# Patient Record
Sex: Female | Born: 1940 | Race: White | Hispanic: No | Smoking: Former smoker
Health system: Southern US, Community
[De-identification: ages and names within clinical notes are randomized; demographics above are authoritative.]

## PROBLEM LIST (undated history)

## (undated) DIAGNOSIS — C649 Malignant neoplasm of unspecified kidney, except renal pelvis: Secondary | ICD-10-CM

## (undated) DIAGNOSIS — E119 Type 2 diabetes mellitus without complications: Secondary | ICD-10-CM

## (undated) DIAGNOSIS — I10 Essential (primary) hypertension: Secondary | ICD-10-CM

## (undated) DIAGNOSIS — E785 Hyperlipidemia, unspecified: Secondary | ICD-10-CM

## (undated) DIAGNOSIS — E079 Disorder of thyroid, unspecified: Secondary | ICD-10-CM

## (undated) DIAGNOSIS — K8020 Calculus of gallbladder without cholecystitis without obstruction: Secondary | ICD-10-CM

## (undated) DIAGNOSIS — K831 Obstruction of bile duct: Secondary | ICD-10-CM

## (undated) DIAGNOSIS — R112 Nausea with vomiting, unspecified: Secondary | ICD-10-CM

## (undated) DIAGNOSIS — Z973 Presence of spectacles and contact lenses: Secondary | ICD-10-CM

## (undated) DIAGNOSIS — I739 Peripheral vascular disease, unspecified: Secondary | ICD-10-CM

## (undated) DIAGNOSIS — I219 Acute myocardial infarction, unspecified: Secondary | ICD-10-CM

## (undated) DIAGNOSIS — E039 Hypothyroidism, unspecified: Secondary | ICD-10-CM

## (undated) DIAGNOSIS — D649 Anemia, unspecified: Secondary | ICD-10-CM

## (undated) DIAGNOSIS — Z8719 Personal history of other diseases of the digestive system: Secondary | ICD-10-CM

## (undated) DIAGNOSIS — H269 Unspecified cataract: Secondary | ICD-10-CM

## (undated) DIAGNOSIS — R748 Abnormal levels of other serum enzymes: Secondary | ICD-10-CM

## (undated) DIAGNOSIS — K219 Gastro-esophageal reflux disease without esophagitis: Secondary | ICD-10-CM

## (undated) DIAGNOSIS — C801 Malignant (primary) neoplasm, unspecified: Secondary | ICD-10-CM

## (undated) DIAGNOSIS — M1990 Unspecified osteoarthritis, unspecified site: Secondary | ICD-10-CM

## (undated) DIAGNOSIS — Z8489 Family history of other specified conditions: Secondary | ICD-10-CM

## (undated) HISTORY — DX: Peripheral vascular disease, unspecified: I73.9

## (undated) HISTORY — PX: COLONOSCOPY: SHX174

## (undated) HISTORY — PX: UPPER GI ENDOSCOPY: SHX6162

---

## 1989-04-22 DIAGNOSIS — I219 Acute myocardial infarction, unspecified: Secondary | ICD-10-CM

## 1989-04-22 HISTORY — DX: Acute myocardial infarction, unspecified: I21.9

## 2015-05-23 DIAGNOSIS — I10 Essential (primary) hypertension: Secondary | ICD-10-CM | POA: Insufficient documentation

## 2015-05-23 DIAGNOSIS — E039 Hypothyroidism, unspecified: Secondary | ICD-10-CM | POA: Insufficient documentation

## 2015-05-23 DIAGNOSIS — E785 Hyperlipidemia, unspecified: Secondary | ICD-10-CM | POA: Insufficient documentation

## 2016-04-22 DIAGNOSIS — Z9289 Personal history of other medical treatment: Secondary | ICD-10-CM

## 2016-04-22 HISTORY — DX: Personal history of other medical treatment: Z92.89

## 2016-06-10 ENCOUNTER — Encounter: Payer: Self-pay | Admitting: Hematology

## 2016-06-10 ENCOUNTER — Telehealth: Payer: Self-pay | Admitting: Hematology

## 2016-06-10 NOTE — Telephone Encounter (Signed)
Appt scheduled for the pt to see [name] on 2/27 at 830am. Aware to arrive at 8am. Demographics verified. Letter mailed to the pt.

## 2016-06-18 ENCOUNTER — Telehealth: Payer: Self-pay | Admitting: Hematology

## 2016-06-18 ENCOUNTER — Encounter: Payer: Self-pay | Admitting: Hematology

## 2016-06-18 ENCOUNTER — Inpatient Hospital Stay
Admission: RE | Admit: 2016-06-18 | Discharge: 2016-06-18 | Disposition: A | Discharge status: Home / Self Care | Payer: Self-pay | Source: Ambulatory Visit | Attending: Hematology | Admitting: Hematology

## 2016-06-18 ENCOUNTER — Ambulatory Visit (HOSPITAL_BASED_OUTPATIENT_CLINIC_OR_DEPARTMENT_OTHER): Payer: Medicare Other | Admitting: Hematology

## 2016-06-18 ENCOUNTER — Ambulatory Visit (HOSPITAL_COMMUNITY)
Admission: RE | Admit: 2016-06-18 | Discharge: 2016-06-18 | Disposition: A | Discharge status: Home / Self Care | Payer: Medicare Other | Source: Ambulatory Visit | Attending: Hematology | Admitting: Hematology

## 2016-06-18 ENCOUNTER — Ambulatory Visit (HOSPITAL_BASED_OUTPATIENT_CLINIC_OR_DEPARTMENT_OTHER): Payer: Medicare Other

## 2016-06-18 ENCOUNTER — Other Ambulatory Visit: Payer: Self-pay | Admitting: Hematology

## 2016-06-18 VITALS — BP 142/58 | HR 90 | Temp 98.2°F | Resp 18 | Ht 59.0 in | Wt 133.1 lb

## 2016-06-18 DIAGNOSIS — D509 Iron deficiency anemia, unspecified: Secondary | ICD-10-CM | POA: Diagnosis not present

## 2016-06-18 DIAGNOSIS — I10 Essential (primary) hypertension: Secondary | ICD-10-CM | POA: Diagnosis not present

## 2016-06-18 DIAGNOSIS — E119 Type 2 diabetes mellitus without complications: Secondary | ICD-10-CM

## 2016-06-18 DIAGNOSIS — R911 Solitary pulmonary nodule: Secondary | ICD-10-CM

## 2016-06-18 DIAGNOSIS — E871 Hypo-osmolality and hyponatremia: Secondary | ICD-10-CM

## 2016-06-18 DIAGNOSIS — R634 Abnormal weight loss: Secondary | ICD-10-CM

## 2016-06-18 DIAGNOSIS — Z808 Family history of malignant neoplasm of other organs or systems: Secondary | ICD-10-CM | POA: Diagnosis not present

## 2016-06-18 DIAGNOSIS — E440 Moderate protein-calorie malnutrition: Secondary | ICD-10-CM | POA: Diagnosis not present

## 2016-06-18 DIAGNOSIS — N289 Disorder of kidney and ureter, unspecified: Secondary | ICD-10-CM

## 2016-06-18 DIAGNOSIS — C642 Malignant neoplasm of left kidney, except renal pelvis: Secondary | ICD-10-CM

## 2016-06-18 DIAGNOSIS — E279 Disorder of adrenal gland, unspecified: Secondary | ICD-10-CM

## 2016-06-18 DIAGNOSIS — C801 Malignant (primary) neoplasm, unspecified: Secondary | ICD-10-CM

## 2016-06-18 DIAGNOSIS — Z87891 Personal history of nicotine dependence: Secondary | ICD-10-CM | POA: Diagnosis not present

## 2016-06-18 DIAGNOSIS — C7970 Secondary malignant neoplasm of unspecified adrenal gland: Secondary | ICD-10-CM

## 2016-06-18 DIAGNOSIS — R918 Other nonspecific abnormal finding of lung field: Secondary | ICD-10-CM

## 2016-06-18 DIAGNOSIS — E538 Deficiency of other specified B group vitamins: Secondary | ICD-10-CM

## 2016-06-18 DIAGNOSIS — D649 Anemia, unspecified: Secondary | ICD-10-CM

## 2016-06-18 DIAGNOSIS — G893 Neoplasm related pain (acute) (chronic): Secondary | ICD-10-CM

## 2016-06-18 LAB — CBC & DIFF AND RETIC
BASO%: 0.2 % (ref 0.0–2.0)
Basophils Absolute: 0 10*3/uL (ref 0.0–0.1)
EOS%: 0.9 % (ref 0.0–7.0)
Eosinophils Absolute: 0.1 10*3/uL (ref 0.0–0.5)
HCT: 27 % — ABNORMAL LOW (ref 34.8–46.6)
HGB: 8.2 g/dL — ABNORMAL LOW (ref 11.6–15.9)
Immature Retic Fract: 10.8 % — ABNORMAL HIGH (ref 1.60–10.00)
LYMPH%: 17.6 % (ref 14.0–49.7)
MCH: 24 pg — ABNORMAL LOW (ref 25.1–34.0)
MCHC: 30.4 g/dL — ABNORMAL LOW (ref 31.5–36.0)
MCV: 78.9 fL — ABNORMAL LOW (ref 79.5–101.0)
MONO#: 0.9 10*3/uL (ref 0.1–0.9)
MONO%: 9.1 % (ref 0.0–14.0)
NEUT#: 7.1 10*3/uL — ABNORMAL HIGH (ref 1.5–6.5)
NEUT%: 72.2 % (ref 38.4–76.8)
Platelets: 552 10*3/uL — ABNORMAL HIGH (ref 145–400)
RBC: 3.42 10*6/uL — ABNORMAL LOW (ref 3.70–5.45)
RDW: 15.8 % — ABNORMAL HIGH (ref 11.2–14.5)
Retic %: 1.5 % (ref 0.70–2.10)
Retic Ct Abs: 51.3 10*3/uL (ref 33.70–90.70)
WBC: 9.8 10*3/uL (ref 3.9–10.3)
lymph#: 1.7 10*3/uL (ref 0.9–3.3)

## 2016-06-18 LAB — TSH: TSH: 2.642 m(IU)/L (ref 0.308–3.960)

## 2016-06-18 LAB — COMPREHENSIVE METABOLIC PANEL
ALT: 9 U/L (ref 0–55)
AST: 10 U/L (ref 5–34)
Albumin: 2.8 g/dL — ABNORMAL LOW (ref 3.5–5.0)
Alkaline Phosphatase: 84 U/L (ref 40–150)
Anion Gap: 13 mEq/L — ABNORMAL HIGH (ref 3–11)
BUN: 25.7 mg/dL (ref 7.0–26.0)
CO2: 22 mEq/L (ref 22–29)
Calcium: 11.3 mg/dL — ABNORMAL HIGH (ref 8.4–10.4)
Chloride: 95 mEq/L — ABNORMAL LOW (ref 98–109)
Creatinine: 1.2 mg/dL — ABNORMAL HIGH (ref 0.6–1.1)
EGFR: 43 mL/min/{1.73_m2} — ABNORMAL LOW (ref >90)
Glucose: 100 mg/dl (ref 70–140)
Potassium: 4.8 mEq/L (ref 3.5–5.1)
Sodium: 130 mEq/L — ABNORMAL LOW (ref 136–145)
Total Bilirubin: 0.27 mg/dL (ref 0.20–1.20)
Total Protein: 8.3 g/dL (ref 6.4–8.3)

## 2016-06-18 LAB — URINALYSIS, MICROSCOPIC - CHCC
Bilirubin (Urine): NEGATIVE
Blood: NEGATIVE
Glucose: NEGATIVE mg/dL
Ketones: NEGATIVE mg/dL
Nitrite: NEGATIVE
Protein: NEGATIVE mg/dL
RBC / HPF: NEGATIVE (ref 0–2)
Specific Gravity, Urine: 1.015 (ref 1.003–1.035)
Urobilinogen, UR: 0.2 mg/dL (ref 0.2–1)
pH: 6 (ref 4.6–8.0)

## 2016-06-18 LAB — IRON AND TIBC
%SAT: 6 % — ABNORMAL LOW (ref 21–57)
Iron: 16 ug/dL — ABNORMAL LOW (ref 41–142)
TIBC: 276 ug/dL (ref 236–444)
UIBC: 261 ug/dL (ref 120–384)

## 2016-06-18 LAB — ABO/RH: ABO/RH(D): O POS

## 2016-06-18 LAB — PREPARE RBC (CROSSMATCH)

## 2016-06-18 LAB — LACTATE DEHYDROGENASE: LDH: 92 U/L — ABNORMAL LOW (ref 125–245)

## 2016-06-18 LAB — FERRITIN: Ferritin: 195 ng/ml (ref 9–269)

## 2016-06-18 MED ORDER — TRAMADOL HCL 50 MG PO TABS: 25.0000 mg | ORAL_TABLET | Freq: Four times a day (QID) | ORAL | 0 refills | Status: DC | PRN

## 2016-06-18 MED ORDER — ACETAMINOPHEN 325 MG PO TABS
ORAL_TABLET | ORAL | Status: AC
  Filled 2016-06-18: qty 2

## 2016-06-18 MED ORDER — SODIUM CHLORIDE 0.9 % IV SOLN
250.0000 mL | Freq: Once | INTRAVENOUS | Status: AC
  Administered 2016-06-18: 250 mL via INTRAVENOUS

## 2016-06-18 MED ORDER — ACETAMINOPHEN 325 MG PO TABS
650.0000 mg | ORAL_TABLET | Freq: Once | ORAL | Status: AC
  Administered 2016-06-18: 650 mg via ORAL

## 2016-06-18 NOTE — Progress Notes (Signed)
Marland Kitchen    HEMATOLOGY/ONCOLOGY CONSULTATION NOTE  Date of Service: 06/18/2016  PCP: Kristine Linea MD ([place]) Endocrinolgy - Dr Derrill Kay GI- Dr Bubba Camp  CHIEF COMPLAINTS/PURPOSE OF CONSULTATION:  Renal cell carcinoma  Anemia  HISTORY OF PRESENTING ILLNESS:   Audrey [name] is a wonderful 76 y.o. female who has been referred to [name] by Dr Kristine Linea MD  for evaluation and management of concern for newly diagnosed Renal cell carcinoma.  Patient has a history of hypertension, dyslipidemia, diabetes then presented to her primary care physician with hematuria and a urinary tract infection on 03/13/2016. Patient notes she was noted to have an Escherichia coli urinary tract infection and was treated with ciprofloxacin. She was noted to have significant anemia with a positive fecal occult blood test and subsequently had an EGD and colonoscopy with Dr. Bubba Camp in January 2018 and December 2017 respectively. No overt evidence of bleeding. Was noted to have a hiatal hernia with esophagitis.  Patient notes that she was having lower abdominal and left flank pain and progressive weight loss of 20 pounds along with anemia with a hemoglobin of 8.4 .she also reports that she was having chills every night and was taking Aleve. Notes aggressive shortness of breath with increased dyspnea on exertion and fatigue. Increased nausea with decreased by mouth fluid intake and fluid intake.   Patient had a CT of the chest on 05/22/2016 due to her low left rib/flank plain. This showed indeterminate bilateral pulmonary nodules with the largest one in the right middle lobe. Indeterminate bilateral adrenal nodules were noted. 1.9 cm left adrenal nodule and 2.4 cm right adrenal nodule.  Patient subsequently had an MRI of the abdomen with and without contrast to evaluate adrenal nodules. This was done on 06/04/2016 and showed a large heterogenous enhancing mass involving the left interpolar and lower  pole of the kidney measuring 8.8 cm x 6.9 cm x 7.6 cm. Left renal vein was patent. No definitive evidence of invasion of the posterior oblique musculature. No ascites. No upper abdominal lymphadenopathy. Heterogenous enhancing bilateral adrenal nodules were noted measuring 2.2 cm on the right and 1.5 cm on the left.  Patient was referred to Dr. Hulen Shouts at [facility] and has an appointment on 06/27/2016 for urology evaluation.  She was referred to [name] for further evaluation of her anemia. Currently notes no active hematuria or rectal bleeding. No melena.  MEDICAL HISTORY:   Hypertension Dyslipidemia Osteoarthritis Ex-smoker Coronary artery disease Thyroid disorder-was apparently on levothyroxine 25 g daily which has subsequently been discontinued . 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 Earley Brooke Colonoscopy 03/2016 Dr. Earley Brooke  SOCIAL HISTORY: Social History   Social History  . Marital status: Married    Spouse name: N/A  . Number of children: N/A  . Years of education: N/A   Occupational History  . Not on file.   Social History Main Topics  . Smoking status: Former Smoker    Years: 8.00    Quit date: 06/18/1964  . Smokeless tobacco: Never Used  . Alcohol use No  . Drug use: No  . Sexual activity: Not on file   Other Topics Concern  . Not on file   Social History Narrative  . No narrative on file   Patient lives in [place] Works as a Solicitor part-time Ex-smoker quit long time ago In 1961 .  Smoked 2 packs per day for 8 years prior to that .  or a lot of stress since her daughter is also getting treated for stage IV uterine cancer.  FAMILY HISTORY:  Mother deceased Father with asthma and emphysema died at 71 years with the MI. Brother deceased of heart disease   ALLERGIES:  is allergic to  adhesive [tape] and nsaids.  MEDICATIONS:  Current Outpatient Prescriptions  Medication Sig Dispense Refill  . acetaminophen (TYLENOL) 500 MG tablet Take 500 mg by mouth 2 (two) times daily.    Marland Kitchen atorvastatin (LIPITOR) 40 MG tablet Take 40 mg by mouth daily.    Marland Kitchen glimepiride (AMARYL) 1 MG tablet Take 1 mg by mouth daily with breakfast.    . hydrochlorothiazide (HYDRODIURIL) 25 MG tablet Take 25 mg by mouth daily.    Marland Kitchen lisinopril (PRINIVIL,ZESTRIL) 40 MG tablet Take 40 mg by mouth daily.    . Cholecalciferol (VITAMIN D3) 5000 units TABS Take 1 tablet by mouth daily.     No current facility-administered medications for this visit.     REVIEW OF SYSTEMS:    10 Point review of Systems was done is negative except as noted above.  PHYSICAL EXAMINATION: ECOG PERFORMANCE STATUS: 1 - Symptomatic but completely ambulatory  . Vitals:   06/18/16 0837  BP: (!) 142/58  Pulse: 90  Resp: 18  Temp: 98.2 F (36.8 C)   Filed Weights   06/18/16 0837  Weight: 133 lb 1.6 oz (60.4 kg)   .Body mass index is 26.88 kg/m.  GENERAL:alert, in no acute distress and comfortable SKIN: skin color, texture, turgor are normal, no rashes or significant lesions EYES: normal, conjunctiva are pink and non-injected, sclera clear OROPHARYNX:no exudate, no erythema and lips, buccal mucosa, and tongue normal  NECK: supple, no JVD, thyroid normal size, non-tender, without nodularity LYMPH:  no palpable lymphadenopathy in the cervical, axillary or inguinal LUNGS: clear to auscultation with normal respiratory effort HEART: regular rate & rhythm,  no murmurs and no lower extremity edema ABDOMEN: abdomen soft, non-tender, normoactive bowel sounds  Musculoskeletal: no cyanosis of digits and no clubbing  PSYCH: alert & oriented x 3 with fluent speech NEURO: no focal motor/sensory deficits  LABORATORY DATA:  I have reviewed the data as listed  . CBC Latest Ref Rng & Units 06/18/2016  WBC 3.9 - 10.3 10e3/uL 9.8    Hemoglobin 11.6 - 15.9 g/dL 8.2(L)  Hematocrit 34.8 - 46.6 % 27.0(L)  Platelets 145 - 400 10e3/uL 552(H)    . CMP Latest Ref Rng & Units 06/18/2016 06/18/2016  Glucose 70 - 140 mg/dl 100 -  BUN 7.0 - 26.0 mg/dL 25.7 -  Creatinine 0.6 - 1.1 mg/dL 1.2(H) -  Sodium 136 - 145 mEq/L 130(L) -  Potassium 3.5 - 5.1 mEq/L 4.8 -  CO2 22 - 29 mEq/L 22 -  Calcium 8.4 - 10.4 mg/dL 11.3(H) -  Total Protein 6.0 - 8.5 g/dL 8.3 7.0  Total Bilirubin 0.20 - 1.20 mg/dL 0.27 -  Alkaline Phos 40 - 150 U/L 84 -  AST 5 - 34 U/L 10 -  ALT 0 - 55 U/L 9 -   B12 level 299 (OSH)  Component     Latest Ref Rng & Units 06/18/2016  IgG (Immunoglobin G), Serum     700 - 1,600 mg/dL 832  IgA/Immunoglobulin A, Serum     64 - 422 mg/dL 169  IgM, Qn, Serum     26 - 217 mg/dL 60  Total Protein  6.0 - 8.5 g/dL 7.0  Albumin SerPl Elph-Mcnc     2.9 - 4.4 g/dL 3.0  Alpha 1     0.0 - 0.4 g/dL 0.5 (H)  Alpha2 Glob SerPl Elph-Mcnc     0.4 - 1.0 g/dL 1.4 (H)  B-Globulin SerPl Elph-Mcnc     0.7 - 1.3 g/dL 1.1  Gamma Glob SerPl Elph-Mcnc     0.4 - 1.8 g/dL 0.9  M Protein SerPl Elph-Mcnc     Not Observed g/dL Not Observed  Globulin, Total     2.2 - 3.9 g/dL 4.0 (H)  Albumin/Glob SerPl     0.7 - 1.7 0.8  IFE 1      Comment  Please Note (HCV):      Comment  Iron     41 - 142 ug/dL 16 (L)  TIBC     236 - 444 ug/dL 276  UIBC     120 - 384 ug/dL 261  %SAT     21 - 57 % 6 (L)  T3 Uptake Ratio     24 - 39 % 26  Free Thyroxine Index     1.2 - 4.9 2.3  Sed Rate     0 - 40 mm/hr 84 (H)  CRP     0.0 - 4.9 mg/L 119.9 (H)  LDH     125 - 245 U/L 92 (L)  Ferritin     9 - 269 ng/ml 195  Vitamin B12     232 - 1,245 pg/mL 338  Thyroxine (T4)     4.5 - 12.0 ug/dL 8.8  T4,Free(Direct)     0.82 - 1.77 ng/dL 1.29  TSH     0.308 - 3.960 m(IU)/L 2.642   RADIOGRAPHIC STUDIES: I have personally reviewed the radiological images as listed and agreed with the findings in the report. No results  found.  ASSESSMENT & PLAN:   76 year old Caucasian female with  #1 Newly diagnosed left renal mass concerning for renal cell carcinoma. She has bilateral adrenal nodules and indeterminate dominant pulmonary nodules which might represent metastatic disease. Plan -We'll get a PET CT scan to evaluate the status of her disease and determine if the adrenal nodules that are poorly imaged on her MRI are FDG avid. -MRI brain to complete staging -If FDG avid would have interventional radiology try to do a CT-guided biopsy of the adrenal lesion to determine if these represent metastatic lesions since that would have a bearing on her management. -She has an upcoming appointment at [place] [facility] with Dr. Hulen Shouts on 06/27/2016 for a urologic assessment and possible consideration of surgery. -In general if the tumor is not clearly metastatic would avoid  breaching the renal capsule to keep the tumor localized .  #2  Microcytic hypochromic anemia This primarily appears to be related to anemia of chronic disease related to her newly diagnosed malignancy. This is consistent with her significant elevated inflammatory markers including sedimentation rate and CRP. Thyroid function is within normal limits. She might have an element of iron deficiency. Ferritin is falsely elevated in the setting of acute inflammation. *Function is within normal limits LDH within normal limits and suggests no evidence of hemolysis.  Patient's anemia is significantly symptomatic with significant dyspnea on exertion some lightheadedness and dizziness. Plan  -Given the patient is symptomatic we will plan to transfuse her 2 units of PRBCs . This is also going to be important since she might need a nephrectomy and other workup .   #3 moderate regarding calorie  malnutrition Has lost 20 pounds in the last 1-2 months due to newly diagnosed malignancy. Plan -Counseled to discontinue metformin in the setting of significant weight loss  . -Monitor diabetes . -she'll need to contact with her endocrinologist to back off on medications as needed . -Dietitian referral  #4 hyponatremia with hypercalcemia suggestive volume contracted state. Imaging thus far has not shown any overt bony metastases. Could potentially be paraneoplastic Plan -Encouraged patient to maintain good by mouth fluid intake   Primary care physician continue follow-up with primary care physician for management of other medical comorbidities.  Labs today PRBC transfusion today x 2 units CT guided biopsy of adrenal lesion PET/CT and MRI brain ASAP for Renal cell cancer staging -RTC with Dr Irene Limbo in 10-14 days with labs/PET/MRI  All of the patients questions were answered with apparent satisfaction. The patient knows to call the clinic with any problems, questions or concerns.  I spent 60 minutes counseling the patient face to face. The total time spent in the appointment was 80 minutes and more than 50% was on counseling and direct patient cares.    Sullivan Lone MD [place] AAHIVMS [facility] Hematology/Oncology Physician [place]  (Office):       [phone] (Work cell):  [phone] (Fax):           [phone]  06/18/2016 8:59 AM

## 2016-06-18 NOTE — Patient Instructions (Addendum)
-  hold metformin and monitor blood sugars -please take over the counter Liquid B12 1000 microgram daily -plz keep your urology appointment with Dr Hulen Shouts at [facility] on 3/8 for urology evaluation. Based on his input and your choice we could refer you to a local urologist in the [facility] system if needed.

## 2016-06-18 NOTE — Progress Notes (Signed)
Pt and VS stable at discharge. 

## 2016-06-18 NOTE — Telephone Encounter (Signed)
Lab added for today, per 06/18/16 los. Appointments scheduled per 06/18/16 los. Awaiting response Tammi/Charge Nurse/Infusion if able to accommodate in Infusion for transfusion, as per [facility] is at capacity also.

## 2016-06-18 NOTE — Patient Instructions (Signed)
Blood Transfusion , Adult A blood transfusion is a procedure in which you receive donated blood, including plasma, platelets, and red blood cells, through an IV tube. You may need a blood transfusion because of illness, surgery, or injury. The blood may come from a donor. You may also be able to donate blood for yourself (autologous blood donation) before a surgery if you know that you might require a blood transfusion. The blood given in a transfusion is made up of different types of cells. You may receive:  Red blood cells. These carry oxygen to the cells in the body.  White blood cells. These help you fight infections.  Platelets. These help your blood to clot.  Plasma. This is the liquid part of your blood and it helps with fluid imbalances. If you have hemophilia or another clotting disorder, you may also receive other types of blood products. Tell a health care provider about:  Any allergies you have.  All medicines you are taking, including vitamins, herbs, eye drops, creams, and over-the-counter medicines.  Any problems you or family members have had with anesthetic medicines.  Any blood disorders you have.  Any surgeries you have had.  Any medical conditions you have, including any recent fever or cold symptoms.  Whether you are pregnant or may be pregnant.  Any previous reactions you have had during a blood transfusion. What are the risks? Generally, this is a safe procedure. However, problems may occur, including:  Having an allergic reaction to something in the donated blood. Hives and itching may be symptoms of this type of reaction.  Fever. This may be a reaction to the white blood cells in the transfused blood. Nausea or chest pain may accompany a fever.  Iron overload. This can happen from having many transfusions.  Transfusion-related acute lung injury (TRALI). This is a rare reaction that causes lung damage. The cause is not known.TRALI can occur within hours  of a transfusion or several days later.  Sudden (acute) or delayed hemolytic reactions. This happens if your blood does not match the cells in your transfusion. Your body's defense system (immune system) may try to attack the new cells. This complication is rare. The symptoms include fever, chills, nausea, and low back pain or chest pain.  Infection or disease transmission. This is rare. What happens before the procedure?  You will have a blood test to determine your blood type. This is necessary to know what kind of blood your body will accept and to match it to the donor blood.  If you are going to have a planned surgery, you may be able to do an autologous blood donation. This may be done in case you need to have a transfusion.  If you have had an allergic reaction to a transfusion in the past, you may be given medicine to help prevent a reaction. This medicine may be given to you by mouth or through an IV tube.  You will have your temperature, blood pressure, and pulse monitored before the transfusion.  Follow instructions from your health care provider about eating and drinking restrictions.  Ask your health care provider about:  Changing or stopping your regular medicines. This is especially important if you are taking diabetes medicines or blood thinners.  Taking medicines such as aspirin and ibuprofen. These medicines can thin your blood. Do not take these medicines before your procedure if your health care provider instructs you not to. What happens during the procedure?  An IV tube will be   inserted into one of your veins.  The bag of donated blood will be attached to your IV tube. The blood will then enter through your vein.  Your temperature, blood pressure, and pulse will be monitored regularly during the transfusion. This monitoring is done to detect early signs of a transfusion reaction.  If you have any signs or symptoms of a reaction, your transfusion will be stopped and  you may be given medicine.  When the transfusion is complete, your IV tube will be removed.  Pressure may be applied to the IV site for a few minutes.  A bandage (dressing) will be applied. The procedure may vary among health care providers and hospitals. What happens after the procedure?  Your temperature, blood pressure, heart rate, breathing rate, and blood oxygen level will be monitored often.  Your blood may be tested to see how you are responding to the transfusion.  You may be warmed with fluids or blankets to maintain a normal body temperature. Summary  A blood transfusion is a procedure in which you receive donated blood, including plasma, platelets, and red blood cells, through an IV tube.  Your temperature, blood pressure, and pulse will be monitored before, during, and after the transfusion.  Your blood may be tested after the transfusion to see how your body has responded. This information is not intended to replace advice given to you by your health care provider. Make sure you discuss any questions you have with your health care provider. Document Released: 04/05/2000 Document Revised: 01/04/2016 Document Reviewed: 01/04/2016 Elsevier Interactive Patient Education  2017 [employer]  

## 2016-06-19 ENCOUNTER — Encounter (HOSPITAL_COMMUNITY): Payer: Medicare Other

## 2016-06-19 LAB — TYPE AND SCREEN
ABO/RH(D): O POS
Antibody Screen: NEGATIVE
Unit division: 0
Unit division: 0

## 2016-06-19 LAB — VITAMIN B12: Vitamin B12: 338 pg/mL (ref 232–1245)

## 2016-06-19 LAB — BPAM RBC
Blood Product Expiration Date: 201803272359
Blood Product Expiration Date: 201803272359
ISSUE DATE / TIME: 201802271315
ISSUE DATE / TIME: 201802271315
Unit Type and Rh: 5100
Unit Type and Rh: 5100

## 2016-06-19 LAB — SEDIMENTATION RATE: Sedimentation Rate-Westergren: 84 mm/hr — ABNORMAL HIGH (ref 0–40)

## 2016-06-19 LAB — T4: Thyroxine (T4): 8.8 ug/dL (ref 4.5–12.0)

## 2016-06-19 LAB — C-REACTIVE PROTEIN: CRP: 119.9 mg/L — ABNORMAL HIGH (ref 0.0–4.9)

## 2016-06-19 LAB — T3 UPTAKE
Free Thyroxine Index: 2.3 (ref 1.2–4.9)
T3 Uptake Ratio: 26 % (ref 24–39)

## 2016-06-19 LAB — T4, FREE: T4,Free(Direct): 1.29 ng/dL (ref 0.82–1.77)

## 2016-06-21 LAB — MULTIPLE MYELOMA PANEL, SERUM
Albumin SerPl Elph-Mcnc: 3 g/dL (ref 2.9–4.4)
Albumin/Glob SerPl: 0.8 (ref 0.7–1.7)
Alpha 1: 0.5 g/dL — ABNORMAL HIGH (ref 0.0–0.4)
Alpha2 Glob SerPl Elph-Mcnc: 1.4 g/dL — ABNORMAL HIGH (ref 0.4–1.0)
B-Globulin SerPl Elph-Mcnc: 1.1 g/dL (ref 0.7–1.3)
Gamma Glob SerPl Elph-Mcnc: 0.9 g/dL (ref 0.4–1.8)
Globulin, Total: 4 g/dL — ABNORMAL HIGH (ref 2.2–3.9)
IgA, Qn, Serum: 169 mg/dL (ref 64–422)
IgG, Qn, Serum: 832 mg/dL (ref 700–1600)
IgM, Qn, Serum: 60 mg/dL (ref 26–217)
Total Protein: 7 g/dL (ref 6.0–8.5)

## 2016-06-26 ENCOUNTER — Ambulatory Visit (HOSPITAL_COMMUNITY)
Admission: RE | Admit: 2016-06-26 | Discharge: 2016-06-26 | Disposition: A | Discharge status: Home / Self Care | Payer: Medicare Other | Source: Ambulatory Visit | Attending: Hematology | Admitting: Hematology

## 2016-06-26 ENCOUNTER — Encounter (HOSPITAL_COMMUNITY): Payer: Self-pay | Admitting: Radiology

## 2016-06-26 ENCOUNTER — Other Ambulatory Visit: Payer: Self-pay | Admitting: Radiology

## 2016-06-26 ENCOUNTER — Other Ambulatory Visit: Payer: Self-pay | Admitting: General Surgery

## 2016-06-26 DIAGNOSIS — R918 Other nonspecific abnormal finding of lung field: Secondary | ICD-10-CM

## 2016-06-26 DIAGNOSIS — C7970 Secondary malignant neoplasm of unspecified adrenal gland: Secondary | ICD-10-CM | POA: Diagnosis not present

## 2016-06-26 DIAGNOSIS — K8020 Calculus of gallbladder without cholecystitis without obstruction: Secondary | ICD-10-CM | POA: Diagnosis not present

## 2016-06-26 DIAGNOSIS — C642 Malignant neoplasm of left kidney, except renal pelvis: Secondary | ICD-10-CM | POA: Diagnosis present

## 2016-06-26 DIAGNOSIS — D649 Anemia, unspecified: Secondary | ICD-10-CM | POA: Diagnosis not present

## 2016-06-26 LAB — GLUCOSE, CAPILLARY: Glucose-Capillary: 114 mg/dL — ABNORMAL HIGH (ref 65–99)

## 2016-06-26 MED ORDER — FLUDEOXYGLUCOSE F - 18 (FDG) INJECTION
6.3500 | Freq: Once | INTRAVENOUS | Status: AC | PRN
Start: 2016-06-26 — End: 2016-06-26
  Administered 2016-06-26: 6.35 via INTRAVENOUS

## 2016-06-26 MED ORDER — GADOBENATE DIMEGLUMINE 529 MG/ML IV SOLN
15.0000 mL | Freq: Once | INTRAVENOUS | Status: AC | PRN
Start: 2016-06-26 — End: 2016-06-26
  Administered 2016-06-26: 11 mL via INTRAVENOUS

## 2016-06-27 ENCOUNTER — Other Ambulatory Visit: Payer: Self-pay | Admitting: *Deleted

## 2016-06-27 ENCOUNTER — Encounter (HOSPITAL_COMMUNITY): Payer: Self-pay

## 2016-06-27 ENCOUNTER — Ambulatory Visit (HOSPITAL_COMMUNITY)
Admission: RE | Admit: 2016-06-27 | Discharge: 2016-06-27 | Disposition: A | Discharge status: Home / Self Care | Payer: Medicare Other | Source: Ambulatory Visit | Attending: Hematology | Admitting: Hematology

## 2016-06-27 DIAGNOSIS — E119 Type 2 diabetes mellitus without complications: Secondary | ICD-10-CM | POA: Insufficient documentation

## 2016-06-27 DIAGNOSIS — C642 Malignant neoplasm of left kidney, except renal pelvis: Secondary | ICD-10-CM | POA: Diagnosis not present

## 2016-06-27 DIAGNOSIS — Z79899 Other long term (current) drug therapy: Secondary | ICD-10-CM | POA: Diagnosis not present

## 2016-06-27 DIAGNOSIS — D649 Anemia, unspecified: Secondary | ICD-10-CM | POA: Insufficient documentation

## 2016-06-27 DIAGNOSIS — R918 Other nonspecific abnormal finding of lung field: Secondary | ICD-10-CM | POA: Diagnosis not present

## 2016-06-27 DIAGNOSIS — I10 Essential (primary) hypertension: Secondary | ICD-10-CM | POA: Diagnosis not present

## 2016-06-27 DIAGNOSIS — C7970 Secondary malignant neoplasm of unspecified adrenal gland: Secondary | ICD-10-CM | POA: Insufficient documentation

## 2016-06-27 DIAGNOSIS — Z7984 Long term (current) use of oral hypoglycemic drugs: Secondary | ICD-10-CM | POA: Diagnosis not present

## 2016-06-27 DIAGNOSIS — E785 Hyperlipidemia, unspecified: Secondary | ICD-10-CM | POA: Diagnosis not present

## 2016-06-27 DIAGNOSIS — Z87891 Personal history of nicotine dependence: Secondary | ICD-10-CM | POA: Diagnosis not present

## 2016-06-27 DIAGNOSIS — E279 Disorder of adrenal gland, unspecified: Secondary | ICD-10-CM | POA: Diagnosis present

## 2016-06-27 HISTORY — DX: Essential (primary) hypertension: I10

## 2016-06-27 HISTORY — DX: Type 2 diabetes mellitus without complications: E11.9

## 2016-06-27 HISTORY — DX: Hyperlipidemia, unspecified: E78.5

## 2016-06-27 LAB — CBC
HCT: 30.9 % — ABNORMAL LOW (ref 36.0–46.0)
Hemoglobin: 9.7 g/dL — ABNORMAL LOW (ref 12.0–15.0)
MCH: 25.3 pg — ABNORMAL LOW (ref 26.0–34.0)
MCHC: 31.4 g/dL (ref 30.0–36.0)
MCV: 80.7 fL (ref 78.0–100.0)
Platelets: 409 10*3/uL — ABNORMAL HIGH (ref 150–400)
RBC: 3.83 MIL/uL — ABNORMAL LOW (ref 3.87–5.11)
RDW: 15.8 % — ABNORMAL HIGH (ref 11.5–15.5)
WBC: 6.6 10*3/uL (ref 4.0–10.5)

## 2016-06-27 LAB — APTT: aPTT: 37 seconds — ABNORMAL HIGH (ref 24–36)

## 2016-06-27 LAB — PROTIME-INR
INR: 1.04
Prothrombin Time: 13.6 seconds (ref 11.4–15.2)

## 2016-06-27 LAB — GLUCOSE, CAPILLARY: Glucose-Capillary: 86 mg/dL (ref 65–99)

## 2016-06-27 MED ORDER — MIDAZOLAM HCL 2 MG/2ML IJ SOLN
INTRAMUSCULAR | Status: AC | PRN
  Administered 2016-06-27 (×2): 1 mg via INTRAVENOUS

## 2016-06-27 MED ORDER — GELATIN ABSORBABLE 12-7 MM EX MISC
CUTANEOUS | Status: AC
  Filled 2016-06-27: qty 1

## 2016-06-27 MED ORDER — MIDAZOLAM HCL 2 MG/2ML IJ SOLN
INTRAMUSCULAR | Status: AC
  Filled 2016-06-27: qty 4

## 2016-06-27 MED ORDER — LIDOCAINE HCL 1 % IJ SOLN
INTRAMUSCULAR | Status: AC
  Filled 2016-06-27: qty 20

## 2016-06-27 MED ORDER — FENTANYL CITRATE (PF) 100 MCG/2ML IJ SOLN
INTRAMUSCULAR | Status: AC | PRN
  Administered 2016-06-27: 50 ug via INTRAVENOUS

## 2016-06-27 MED ORDER — SODIUM CHLORIDE 0.9 % IV SOLN: INTRAVENOUS | Status: DC

## 2016-06-27 MED ORDER — FENTANYL CITRATE (PF) 100 MCG/2ML IJ SOLN
INTRAMUSCULAR | Status: AC
  Filled 2016-06-27: qty 4

## 2016-06-27 NOTE — H&P (Signed)
Chief Complaint: Patient was seen in consultation today for adrenal mass biopsy at the request of Brunetta Genera  Referring Physician(s): Brunetta Genera  Supervising Physician: Sandi Mariscal  Patient Status: [facility] - Out-pt  History of Present Illness: Audrey [name] is a 76 y.o. female being worked up for large left renal lesion presumed to be RCC. She is also found to have bilateral adrenal lesions concerning for mets. She underwent PET yesterday and is scheduled for adrenal lesion biopsy today. PMHx, meds, labs, imaging reviewed. Has been NPO this am. Feels well otherwise. Family at bedside.   Past Medical History:  Diagnosis Date  . Diabetes mellitus ([place])   . HTN (hypertension)   . Hyperlipidemia     History reviewed. No pertinent surgical history.  Allergies: Patient has no known allergies.  Medications: Prior to Admission medications   Medication Sig Start Date End Date Taking? Authorizing Provider  acetaminophen (TYLENOL) 500 MG tablet Take 500 mg by mouth 2 (two) times daily.   Yes Historical Provider, MD  atorvastatin (LIPITOR) 40 MG tablet Take 40 mg by mouth daily.   Yes Historical Provider, MD  Cholecalciferol (VITAMIN D3) 5000 units TABS Take 1 tablet by mouth daily.   Yes Historical Provider, MD  glimepiride (AMARYL) 1 MG tablet Take 1 mg by mouth daily with breakfast.   Yes Historical Provider, MD  hydrochlorothiazide (HYDRODIURIL) 25 MG tablet Take 25 mg by mouth daily.   Yes Historical Provider, MD  lisinopril (PRINIVIL,ZESTRIL) 40 MG tablet Take 40 mg by mouth daily.   Yes Historical Provider, MD     History reviewed. No pertinent family history.  Social History   Social History  . Marital status: Married    Spouse name: N/A  . Number of children: N/A  . Years of education: N/A   Social History Main Topics  . Smoking status: Former Smoker    Years: 8.00    Quit date: 06/18/1964  . Smokeless tobacco: Never Used  . Alcohol use No  .  Drug use: No  . Sexual activity: Not Asked   Other Topics Concern  . None   Social History Narrative  . None     Review of Systems: A 12 point ROS discussed and pertinent positives are indicated in the HPI above.  All other systems are negative.  Review of Systems  Vital Signs: BP (!) 158/81   Pulse 86   Temp 97.1 F (36.2 C)   Resp 18   Ht 4\' 11"  (1.499 m)   Wt 130 lb (59 kg)   SpO2 98%   BMI 26.26 kg/m   Physical Exam  Constitutional: She is oriented to person, place, and time. She appears well-developed and well-nourished. No distress.  HENT:  Head: Normocephalic.  Mouth/Throat: Oropharynx is clear and moist.  Neck: Normal range of motion. No JVD present. No tracheal deviation present.  Cardiovascular: Normal rate, regular rhythm and normal heart sounds.   Pulmonary/Chest: Effort normal and breath sounds normal. No respiratory distress.  Abdominal: Soft. She exhibits no mass. There is no tenderness.  Neurological: She is alert and oriented to person, place, and time.  Skin: Skin is warm and dry.    Mallampati Score:  MD Evaluation Airway: WNL Heart: WNL Abdomen: WNL Chest/ Lungs: WNL ASA  Classification: 2 Mallampati/Airway Score: Two  Imaging: Mr Jeri Cos TM Contrast  Result Date: 06/26/2016 CLINICAL DATA:  Metastatic renal cell carcinoma.  Staging. EXAM: MRI HEAD WITHOUT AND WITH CONTRAST TECHNIQUE: Multiplanar, multiecho  pulse sequences of the brain and surrounding structures were obtained without and with intravenous contrast. CONTRAST:  50mL MULTIHANCE GADOBENATE DIMEGLUMINE 529 MG/ML IV SOLN COMPARISON:  None. FINDINGS: Brain: Mild atrophy. Negative for hydrocephalus. Negative for acute or chronic infarction. Negative for hemorrhage or mass Normal enhancement postcontrast administration. No evidence of metastatic deposits in the brain. Vascular: Normal arterial flow void. Skull and upper cervical spine: Negative Sinuses/Orbits: Negative Other: None  IMPRESSION: Negative for metastatic disease to the brain.  No acute abnormality. Electronically Signed   By: Franchot Gallo M.D.   On: 06/26/2016 14:11   Nm Pet Image Initial (pi) Skull Base To Thigh  Result Date: 06/26/2016 CLINICAL DATA:  Initial treatment strategy for renal cell carcinoma. EXAM: NUCLEAR MEDICINE PET SKULL BASE TO THIGH TECHNIQUE: 6.35 mCi F-18 FDG was injected intravenously. Full-ring PET imaging was performed from the skull base to thigh after the radiotracer. CT data was obtained and used for attenuation correction and anatomic localization. FASTING BLOOD GLUCOSE:  Value: 114 mg/dl COMPARISON:  05/22/2016 FINDINGS: NECK No hypermetabolic lymph nodes in the neck. CHEST No hypermetabolic mediastinal or hilar nodes. Aortic atherosclerosis. Calcifications involving the RCA, LAD and left circumflex coronary artery. Pulmonary nodule within the right middle lobe is too small to characterize measuring 5 mm, image 37 of series 8. Right lower lobe pulmonary nodule is also too small to characterize measuring 3 mm, image 35 of series 8. ABDOMEN/PELVIS No abnormal hypermetabolic activity within the liver, pancreas, or spleen. Stones within the gallbladder noted. Right adrenal nodule measures 2.2 cm and 21 HU. SUV max is equal to 2.46. The left adrenal nodule measures 1.7 cm and 25 HU. SUV max associated with this nodule is equal to 2.74. Large irregular and heterogeneous mass arising from the inferior pole of the left kidney is again noted measuring 8.1 by 6.7 cm with an SUV max equal to 9.2. 7 mm periaortic node has an SUV max equal to 2.5. SKELETON No focal hypermetabolic activity to suggest skeletal metastasis. IMPRESSION: 1. Mass arising from the inferior pole of the left kidney is identified compatible with suspected renal cell carcinoma. 2. Bilateral adrenal nodules with mild increased radiotracer uptake. 3. Small pulmonary nodules in the right lung are too small to reliably characterize by PET-CT.  These remain nonspecific however metastatic disease cannot be excluded. 4. Gallstones. Electronically Signed   By: Kerby Moors M.D.   On: 06/26/2016 12:43    Labs:  CBC:  Recent Labs  06/18/16 1123  WBC 9.8  HGB 8.2*  HCT 27.0*  PLT 552*    COAGS: No results for input(s): INR, APTT in the last 8760 hours.  BMP:  Recent Labs  06/18/16 1123  NA 130*  K 4.8  CO2 22  GLUCOSE 100  BUN 25.7  CALCIUM 11.3*  CREATININE 1.2*    LIVER FUNCTION TESTS:  Recent Labs  06/18/16 1123  BILITOT 0.27  AST 10  ALT 9  ALKPHOS 84  PROT 8.3  7.0  ALBUMIN 2.8*    TUMOR MARKERS: No results for input(s): AFPTM, CEA, CA199, CHROMGRNA in the last 8760 hours.  Assessment and Plan: Large left renal mass suspicious for RCC Bilateral adrenal lesions suspicious for mets Plan for CT guided biopsy of adrenal lesion Labs pending Risks and Benefits discussed with the patient including, but not limited to bleeding, infection, damage to adjacent structures or low yield requiring additional tests. All of the patient's questions were answered, patient is agreeable to proceed. Consent signed and in  chart.    Thank you for this interesting consult.  I greatly enjoyed meeting EMERSYNN DEATLEY and look forward to participating in their care.  A copy of this report was sent to the requesting provider on this date.  Electronically Signed: Ascencion Dike 06/27/2016, 10:11 AM   I spent a total of 20 minutes  in face to face in clinical consultation, greater than 50% of which was counseling/coordinating care for adrenal mass biopsy

## 2016-06-27 NOTE — Procedures (Signed)
Pre procedural Dx: Adrenal nodule  Post procedural Dx: Same  Technically successful CT guided biopsy of indeterminate right adrenal gland nodule.   EBL: None.   Complications: None immediate.   Ronny Bacon, MD Pager #: [phone]

## 2016-06-27 NOTE — Discharge Instructions (Signed)
Needle Biopsy, Care After °These instructions give you information about caring for yourself after your procedure. Your doctor may also give you more specific instructions. Call your doctor if you have any problems or questions after your procedure. °Follow these instructions at home: °· Rest as told by your doctor. °· Take medicines only as told by your doctor. °· There are many different ways to close and cover the biopsy site, including stitches (sutures), skin glue, and adhesive strips. Follow instructions from your doctor about: °¨ How to take care of your biopsy site. °¨ When and how you should change your bandage (dressing). °¨ When you should remove your dressing. °¨ Removing whatever was used to close your biopsy site. °· Check your biopsy site every day for signs of infection. Watch for: °¨ Redness, swelling, or pain. °¨ Fluid, blood, or pus. °Contact a doctor if: °· You have a fever. °· You have redness, swelling, or pain at the biopsy site, and it lasts longer than a few days. °· You have fluid, blood, or pus coming from the biopsy site. °· You feel sick to your stomach (nauseous). °· You throw up (vomit). °Get help right away if: °· You are short of breath. °· You have trouble breathing. °· Your chest hurts. °· You feel dizzy or you pass out (faint). °· You have bleeding that does not stop with pressure or a bandage. °· You cough up blood. °· Your belly (abdomen) hurts. °This information is not intended to replace advice given to you by your health care provider. Make sure you discuss any questions you have with your health care provider. °Document Released: 03/21/2008 Document Revised: 09/14/2015 Document Reviewed: 04/04/2014 °Elsevier Interactive Patient Education © 2017 [employer] ° ° °Moderate Conscious Sedation, Adult, Care After °These instructions provide you with information about caring for yourself after your procedure. Your health care provider may also give you more specific instructions.  Your treatment has been planned according to current medical practices, but problems sometimes occur. Call your health care provider if you have any problems or questions after your procedure. °What can I expect after the procedure? °After your procedure, it is common: °· To feel sleepy for several hours. °· To feel clumsy and have poor balance for several hours. °· To have poor judgment for several hours. °· To vomit if you eat too soon. °Follow these instructions at home: °For at least 24 hours after the procedure:  ° °· Do not: °¨ Participate in activities where you could fall or become injured. °¨ Drive. °¨ Use heavy machinery. °¨ Drink alcohol. °¨ Take sleeping pills or medicines that cause drowsiness. °¨ Make important decisions or sign legal documents. °¨ Take care of children on your own. °· Rest. °Eating and drinking  °· Follow the diet recommended by your health care provider. °· If you vomit: °¨ Drink water, juice, or soup when you can drink without vomiting. °¨ Make sure you have little or no nausea before eating solid foods. °General instructions  °· Have a responsible adult stay with you until you are awake and alert. °· Take over-the-counter and prescription medicines only as told by your health care provider. °· If you smoke, do not smoke without supervision. °· Keep all follow-up visits as told by your health care provider. This is important. °Contact a health care provider if: °· You keep feeling nauseous or you keep vomiting. °· You feel light-headed. °· You develop a rash. °· You have a fever. °Get help right   away if: °· You have trouble breathing. °This information is not intended to replace advice given to you by your health care provider. Make sure you discuss any questions you have with your health care provider. °Document Released: 01/27/2013 Document Revised: 09/11/2015 Document Reviewed: 07/29/2015 °Elsevier Interactive Patient Education © 2017 [employer] ° °

## 2016-06-27 NOTE — Sedation Documentation (Signed)
Patient is resting comfortably. 

## 2016-06-28 ENCOUNTER — Ambulatory Visit (HOSPITAL_BASED_OUTPATIENT_CLINIC_OR_DEPARTMENT_OTHER): Payer: Medicare Other | Admitting: Hematology

## 2016-06-28 ENCOUNTER — Other Ambulatory Visit (HOSPITAL_BASED_OUTPATIENT_CLINIC_OR_DEPARTMENT_OTHER): Payer: Medicare Other

## 2016-06-28 ENCOUNTER — Telehealth: Payer: Self-pay | Admitting: Hematology

## 2016-06-28 ENCOUNTER — Encounter: Payer: Self-pay | Admitting: Hematology

## 2016-06-28 VITALS — BP 151/61 | HR 74 | Temp 97.5°F | Resp 16 | Wt 131.1 lb

## 2016-06-28 DIAGNOSIS — D509 Iron deficiency anemia, unspecified: Secondary | ICD-10-CM | POA: Diagnosis not present

## 2016-06-28 DIAGNOSIS — C642 Malignant neoplasm of left kidney, except renal pelvis: Secondary | ICD-10-CM

## 2016-06-28 DIAGNOSIS — E871 Hypo-osmolality and hyponatremia: Secondary | ICD-10-CM

## 2016-06-28 DIAGNOSIS — E278 Other specified disorders of adrenal gland: Secondary | ICD-10-CM

## 2016-06-28 DIAGNOSIS — R911 Solitary pulmonary nodule: Secondary | ICD-10-CM | POA: Diagnosis not present

## 2016-06-28 DIAGNOSIS — E440 Moderate protein-calorie malnutrition: Secondary | ICD-10-CM

## 2016-06-28 DIAGNOSIS — D649 Anemia, unspecified: Secondary | ICD-10-CM

## 2016-06-28 DIAGNOSIS — E279 Disorder of adrenal gland, unspecified: Secondary | ICD-10-CM

## 2016-06-28 LAB — CBC & DIFF AND RETIC
BASO%: 0.3 % (ref 0.0–2.0)
Basophils Absolute: 0 10*3/uL (ref 0.0–0.1)
EOS%: 1.3 % (ref 0.0–7.0)
Eosinophils Absolute: 0.1 10*3/uL (ref 0.0–0.5)
HCT: 35.3 % (ref 34.8–46.6)
HGB: 11.3 g/dL — ABNORMAL LOW (ref 11.6–15.9)
Immature Retic Fract: 3 % (ref 1.60–10.00)
LYMPH%: 20.5 % (ref 14.0–49.7)
MCH: 25.9 pg (ref 25.1–34.0)
MCHC: 32 g/dL (ref 31.5–36.0)
MCV: 80.8 fL (ref 79.5–101.0)
MONO#: 0.8 10*3/uL (ref 0.1–0.9)
MONO%: 13 % (ref 0.0–14.0)
NEUT#: 3.9 10*3/uL (ref 1.5–6.5)
NEUT%: 64.9 % (ref 38.4–76.8)
Platelets: 438 10*3/uL — ABNORMAL HIGH (ref 145–400)
RBC: 4.37 10*6/uL (ref 3.70–5.45)
RDW: 15.6 % — ABNORMAL HIGH (ref 11.2–14.5)
Retic %: 0.73 % (ref 0.70–2.10)
Retic Ct Abs: 31.9 10*3/uL — ABNORMAL LOW (ref 33.70–90.70)
WBC: 6 10*3/uL (ref 3.9–10.3)
lymph#: 1.2 10*3/uL (ref 0.9–3.3)
nRBC: 0 % (ref 0–0)

## 2016-06-28 LAB — IRON AND TIBC
%SAT: 12 % — ABNORMAL LOW (ref 21–57)
Iron: 30 ug/dL — ABNORMAL LOW (ref 41–142)
TIBC: 243 ug/dL (ref 236–444)
UIBC: 213 ug/dL (ref 120–384)

## 2016-06-28 LAB — FERRITIN: Ferritin: 339 ng/mL — ABNORMAL HIGH (ref 9–269)

## 2016-06-28 NOTE — Telephone Encounter (Signed)
Gave patient AVS . Waiting for [facility] to contact patient or scheduling department with appt so that we can schedule the one week follow up after the urology appts per 06/28/2016 los.

## 2016-06-28 NOTE — Telephone Encounter (Signed)
Patient called to advise of upcoming Urology appointment scheduled for 07/12/16 and to request a follow up appointment with Dr Irene Limbo, 1 week after, as in 06/28/16 LOS.  Was unable to schedule appointment dure to Provider on PAL. Message sent to Provider, awaiting response with availability to see patient. Patient is aware. 06/28/16

## 2016-06-30 NOTE — Progress Notes (Signed)
Marland Kitchen    HEMATOLOGY/ONCOLOGY CLINIC NOTE  Date of Service: ..06/28/2016  PCP: Kristine Linea MD ([place]) Endocrinolgy - Dr Derrill Kay GI- Dr Bubba Camp  CHIEF COMPLAINTS/PURPOSE OF CONSULTATION:  Renal cell carcinoma  Anemia  HISTORY OF PRESENTING ILLNESS:   Audrey [name] is a wonderful 76 y.o. female who has been referred to [name] by Dr Kristine Linea MD  for evaluation and management of concern for newly diagnosed Renal cell carcinoma.  Patient has a history of hypertension, dyslipidemia, diabetes then presented to her primary care physician with hematuria and a urinary tract infection on 03/13/2016. Patient notes she was noted to have an Escherichia coli urinary tract infection and was treated with ciprofloxacin. She was noted to have significant anemia with a positive fecal occult blood test and subsequently had an EGD and colonoscopy with Dr. Bubba Camp in January 2018 and December 2017 respectively. No overt evidence of bleeding. Was noted to have a hiatal hernia with esophagitis.  Patient notes that she was having lower abdominal and left flank pain and progressive weight loss of 20 pounds along with anemia with a hemoglobin of 8.4 .she also reports that she was having chills every night and was taking Aleve. Notes aggressive shortness of breath with increased dyspnea on exertion and fatigue. Increased nausea with decreased by mouth fluid intake and fluid intake.   Patient had a CT of the chest on 05/22/2016 due to her low left rib/flank plain. This showed indeterminate bilateral pulmonary nodules with the largest one in the right middle lobe. Indeterminate bilateral adrenal nodules were noted. 1.9 cm left adrenal nodule and 2.4 cm right adrenal nodule.  Patient subsequently had an MRI of the abdomen with and without contrast to evaluate adrenal nodules. This was done on 06/04/2016 and showed a large heterogenous enhancing mass involving the left interpolar and lower  pole of the kidney measuring 8.8 cm x 6.9 cm x 7.6 cm. Left renal vein was patent. No definitive evidence of invasion of the posterior oblique musculature. No ascites. No upper abdominal lymphadenopathy. Heterogenous enhancing bilateral adrenal nodules were noted measuring 2.2 cm on the right and 1.5 cm on the left.  Patient was referred to Dr. Hulen Shouts at [facility] and has an appointment on 06/27/2016 for urology evaluation.  She was referred to [name] for further evaluation of her anemia. Currently notes no active hematuria or rectal bleeding. No melena.  INTERVAL HISTORY  Patient is here for her scheduled follow-up to discuss results of her workup and to have an interval follow-up for her anemia. Patient did not follow-up for her urology appointment at [facility] on 06/27/2016 as per the plan. She interrupted to have her CT-guided adrenal biopsy by interventional radiology here and wants to have a urology referral here in [place] which we have provided. Her biopsy results from her biopsy done on 06/27/2016 are currently pending. We discussed the results of her PET/CT scan and MRI of the brain. She notes that she is feeling much better after the PRBC transfusions and improvement in her hemoglobin. She notes that she has been eating and drinking better after being off the metformin as well and notes that her blood sugars are stable. No other acute new symptoms. She is accompanied by her daughter for this clinic visit.   MEDICAL HISTORY:   Hypertension Dyslipidemia Osteoarthritis Ex-smoker Coronary artery disease Thyroid disorder-was apparently on levothyroxine 25 g daily which has subsequently been discontinued . 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 Earley Brooke Colonoscopy 03/2016 Dr. Earley Brooke  SOCIAL HISTORY: Social History   Social History  .  Marital status: Married    Spouse name: N/A  . Number of children: N/A  . Years of education: N/A   Occupational History  . Not on file.   Social History Main Topics  . Smoking status: Former Smoker    Years: 8.00    Quit date: 06/18/1964  . Smokeless tobacco: Never Used  . Alcohol use No  . Drug use: No  . Sexual activity: Not on file   Other Topics Concern  . Not on file   Social History Narrative  . No narrative on file   Patient lives in [place] Works as a Solicitor part-time Ex-smoker quit long time ago In 1961 . Smoked 2 packs per day for 8 years prior to that .  or a lot of stress since her daughter is also getting treated for stage IV uterine cancer.  FAMILY HISTORY:  Mother deceased Father with asthma and emphysema died at 60 years with the MI. Brother deceased of heart disease   ALLERGIES:  is allergic to adhesive [tape] and nsaids.  MEDICATIONS:  Current Outpatient Prescriptions  Medication Sig Dispense Refill  . acetaminophen (TYLENOL) 500 MG tablet Take 500 mg by mouth 2 (two) times daily.    Marland Kitchen atorvastatin (LIPITOR) 40 MG tablet Take 40 mg by mouth daily.    . Cholecalciferol (VITAMIN D3) 5000 units TABS Take 1 tablet by mouth daily.    Marland Kitchen glimepiride (AMARYL) 1 MG tablet Take 1 mg by mouth daily with breakfast.    . hydrochlorothiazide (HYDRODIURIL) 25 MG tablet Take 25 mg by mouth daily.    Marland Kitchen lisinopril (PRINIVIL,ZESTRIL) 40 MG tablet Take 40 mg by mouth daily.     No current facility-administered medications for this visit.     REVIEW OF SYSTEMS:    10 Point review of Systems was done is negative except as noted above.  PHYSICAL EXAMINATION: ECOG PERFORMANCE STATUS: 1 - Symptomatic but completely ambulatory  . Vitals:   06/28/16 0849  BP: (!) 151/61  Pulse: 74  Resp: 16  Temp: 97.5 F (36.4 C)   Filed Weights   06/28/16 0849  Weight: 131 lb 1.6 oz (59.5 kg)   .Body mass index is 26.48 kg/m.  GENERAL:alert, in no  acute distress and comfortable SKIN: skin color, texture, turgor are normal, no rashes or significant lesions EYES: normal, conjunctiva are pink and non-injected, sclera clear OROPHARYNX:no exudate, no erythema and lips, buccal mucosa, and tongue normal  NECK: supple, no JVD, thyroid normal size, non-tender, without nodularity LYMPH:  no palpable lymphadenopathy in the cervical, axillary or inguinal LUNGS: clear to auscultation with normal respiratory effort HEART: regular rate & rhythm,  no murmurs and no lower extremity edema ABDOMEN: abdomen soft, non-tender, normoactive bowel sounds  Musculoskeletal: no cyanosis of digits and no clubbing  PSYCH: alert & oriented x 3 with fluent speech NEURO: no focal motor/sensory deficits  LABORATORY DATA:  I have reviewed the data as listed  . CBC Latest Ref Rng & Units 06/28/2016 06/27/2016 06/18/2016  WBC 3.9 - 10.3 10e3/uL 6.0 6.6 9.8  Hemoglobin 11.6 - 15.9 g/dL 11.3(L) 9.7(L) 8.2(L)  Hematocrit 34.8 - 46.6 % 35.3 30.9(L) 27.0(L)  Platelets 145 - 400 10e3/uL 438(H) 409(H) 552(H)    . CMP Latest Ref Rng & Units 06/18/2016 06/18/2016  Glucose 70 - 140 mg/dl 100 -  BUN 7.0 - 26.0 mg/dL 25.7 -  Creatinine 0.6 - 1.1 mg/dL 1.2(H) -  Sodium 136 - 145 mEq/L 130(L) -  Potassium 3.5 - 5.1 mEq/L 4.8 -  CO2 22 - 29 mEq/L 22 -  Calcium 8.4 - 10.4 mg/dL 11.3(H) -  Total Protein 6.0 - 8.5 g/dL 8.3 7.0  Total Bilirubin 0.20 - 1.20 mg/dL 0.27 -  Alkaline Phos 40 - 150 U/L 84 -  AST 5 - 34 U/L 10 -  ALT 0 - 55 U/L 9 -   B12 level 299 (OSH)  Component     Latest Ref Rng & Units 06/18/2016  IgG (Immunoglobin G), Serum     700 - 1,600 mg/dL 832  IgA/Immunoglobulin A, Serum     64 - 422 mg/dL 169  IgM, Qn, Serum     26 - 217 mg/dL 60  Total Protein     6.0 - 8.5 g/dL 7.0  Albumin SerPl Elph-Mcnc     2.9 - 4.4 g/dL 3.0  Alpha 1     0.0 - 0.4 g/dL 0.5 (H)  Alpha2 Glob SerPl Elph-Mcnc     0.4 - 1.0 g/dL 1.4 (H)  B-Globulin SerPl Elph-Mcnc     0.7 -  1.3 g/dL 1.1  Gamma Glob SerPl Elph-Mcnc     0.4 - 1.8 g/dL 0.9  M Protein SerPl Elph-Mcnc     Not Observed g/dL Not Observed  Globulin, Total     2.2 - 3.9 g/dL 4.0 (H)  Albumin/Glob SerPl     0.7 - 1.7 0.8  IFE 1      Comment  Please Note (HCV):      Comment  Iron     41 - 142 ug/dL 16 (L)  TIBC     236 - 444 ug/dL 276  UIBC     120 - 384 ug/dL 261  %SAT     21 - 57 % 6 (L)  T3 Uptake Ratio     24 - 39 % 26  Free Thyroxine Index     1.2 - 4.9 2.3  Sed Rate     0 - 40 mm/hr 84 (H)  CRP     0.0 - 4.9 mg/L 119.9 (H)  LDH     125 - 245 U/L 92 (L)  Ferritin     9 - 269 ng/ml 195  Vitamin B12     232 - 1,245 pg/mL 338  Thyroxine (T4)     4.5 - 12.0 ug/dL 8.8  T4,Free(Direct)     0.82 - 1.77 ng/dL 1.29  TSH     0.308 - 3.960 m(IU)/L 2.642   RADIOGRAPHIC STUDIES: I have personally reviewed the radiological images as listed and agreed with the findings in the report. Mr Jeri Cos Wo Contrast  Result Date: 06/26/2016 CLINICAL DATA:  Metastatic renal cell carcinoma.  Staging. EXAM: MRI HEAD WITHOUT AND WITH CONTRAST TECHNIQUE: Multiplanar, multiecho pulse sequences of the brain and surrounding structures were obtained without and with intravenous contrast. CONTRAST:  49mL MULTIHANCE GADOBENATE DIMEGLUMINE 529 MG/ML IV SOLN COMPARISON:  None. FINDINGS: Brain: Mild atrophy. Negative for hydrocephalus. Negative for acute or chronic infarction. Negative for hemorrhage or mass Normal enhancement postcontrast administration. No evidence of metastatic deposits in the brain. Vascular: Normal arterial flow void. Skull and upper cervical spine: Negative Sinuses/Orbits: Negative Other: None IMPRESSION: Negative for metastatic disease to the brain.  No acute abnormality. Electronically Signed   By: Juanda Crumble  Carlis Abbott M.D.   On: 06/26/2016 14:11   Nm Pet Image Initial (pi) Skull Base To Thigh  Result Date: 06/26/2016 CLINICAL DATA:  Initial treatment strategy for renal cell carcinoma. EXAM:  NUCLEAR MEDICINE PET SKULL BASE TO THIGH TECHNIQUE: 6.35 mCi F-18 FDG was injected intravenously. Full-ring PET imaging was performed from the skull base to thigh after the radiotracer. CT data was obtained and used for attenuation correction and anatomic localization. FASTING BLOOD GLUCOSE:  Value: 114 mg/dl COMPARISON:  05/22/2016 FINDINGS: NECK No hypermetabolic lymph nodes in the neck. CHEST No hypermetabolic mediastinal or hilar nodes. Aortic atherosclerosis. Calcifications involving the RCA, LAD and left circumflex coronary artery. Pulmonary nodule within the right middle lobe is too small to characterize measuring 5 mm, image 37 of series 8. Right lower lobe pulmonary nodule is also too small to characterize measuring 3 mm, image 35 of series 8. ABDOMEN/PELVIS No abnormal hypermetabolic activity within the liver, pancreas, or spleen. Stones within the gallbladder noted. Right adrenal nodule measures 2.2 cm and 21 HU. SUV max is equal to 2.46. The left adrenal nodule measures 1.7 cm and 25 HU. SUV max associated with this nodule is equal to 2.74. Large irregular and heterogeneous mass arising from the inferior pole of the left kidney is again noted measuring 8.1 by 6.7 cm with an SUV max equal to 9.2. 7 mm periaortic node has an SUV max equal to 2.5. SKELETON No focal hypermetabolic activity to suggest skeletal metastasis. IMPRESSION: 1. Mass arising from the inferior pole of the left kidney is identified compatible with suspected renal cell carcinoma. 2. Bilateral adrenal nodules with mild increased radiotracer uptake. 3. Small pulmonary nodules in the right lung are too small to reliably characterize by PET-CT. These remain nonspecific however metastatic disease cannot be excluded. 4. Gallstones. Electronically Signed   By: Kerby Moors M.D.   On: 06/26/2016 12:43   Ct Biopsy  Result Date: 06/27/2016 INDICATION: Concern for metastatic renal cell carcinoma. Please perform CT-guided adrenal nodule biopsy  for tissue diagnostic purposes. EXAM: CT-GUIDED BIOPSY OF INDETERMINATE RIGHT ADRENAL GLAND NODULE COMPARISON:  Chest CT - 05/22/2016; abdominal MRI - 06/04/2016; PET-CT - 06/26/2016 MEDICATIONS: None. ANESTHESIA/SEDATION: Fentanyl 50 mcg IV; Versed 2 mg IV Sedation time: 15 minutes; The patient was continuously monitored during the procedure by the interventional radiology nurse under my direct supervision. CONTRAST:  None. COMPLICATIONS: None immediate. PROCEDURE: Informed consent was obtained from the patient following an explanation of the procedure, risks, benefits and alternatives. A time out was performed prior to the initiation of the procedure. The patient was positioned right lateral decubitus on the CT table and a limited CT was performed for procedural planning demonstrating unchanged size and appearance of the approximately 2.3 x 1.7 cm right adrenal gland nodule (image 15, series 2). The procedure was planned. The operative site was prepped and draped in the usual sterile fashion. Appropriate trajectory was confirmed with a 22 gauge spinal needle after the adjacent tissues were anesthetized with 1% Lidocaine with epinephrine. Under intermittent CT guidance, a 17 gauge coaxial needle was advanced into the peripheral aspect of the mass. Appropriate positioning was confirmed and 5 core needle biopsy samples were obtained with an 18 gauge core needle biopsy device. The co-axial needle was removed following the administration of a Gel-Foam slurry and superficial hemostasis was achieved with manual compression. A limited postprocedural CT was negative for hemorrhage or additional complication. A dressing was placed. The patient tolerated the procedure well without immediate postprocedural complication. IMPRESSION: Technically  successful CT guided core needle biopsy of indeterminate right adrenal gland nodule. Electronically Signed   By: Sandi Mariscal M.D.   On: 06/27/2016 12:56   Mr Outside Films  Spine  Result Date: 06/28/2016 This examination belongs to an outside facility and is stored here for comparison purposes only.  Contact the originating outside institution for any associated report or interpretation.   ASSESSMENT & PLAN:   76 year old Caucasian female with  #1 Newly diagnosed left renal mass concerning for renal cell carcinoma. She has bilateral adrenal nodules and indeterminate dominant pulmonary nodules which might represent metastatic disease. PET/CT is consistent with left kidney renal cell carcinoma. Adrenal nodules are only minimally FDG avid and of uncertain etiology. One of these has been biopsied. Biopsy results pending. Pulmonary nodules are too small to evaluate.   MRI brain shows no evidence of metastatic disease  Plan -Based on patient's preference she would like to have her urologic cares. [place] which is understandable . She did not want to follow-up at [facility] as referred by her primary care physician and did not go for that appointment. - she has been given an Urgent Urology referral to [facility] for evaluation and surgical management of RCC. -We are awaiting her CT-guided adrenal nodule biopsy. Even if this showed metastatic disease from the [place] she might still be appropriate for cytoreductive nephrectomy in the setting of oligo metastatic disease . -if the adrenal biopsy shows benign lesion then in the absence of clearly defined metastatic disease her RCC surgery could potentially be curative . - might need a clearance from her primary care physician from a medical standpoint for the surgery  #2  Microcytic hypochromic anemia This primarily appears to be related to anemia of chronic disease related to her newly diagnosed malignancy. This is consistent with her significant elevated inflammatory markers including sedimentation rate and CRP. Thyroid function is within normal limits. She might have an element of iron deficiency. Ferritin is falsely  elevated in the setting of acute inflammation. *Function is within normal limits LDH within normal limits and suggests no evidence of hemolysis.  -Patient's hemoglobin is much improved at 11.3 after 2 units of PRBCs . She feels much better and on this front is certainly better optimized for surgery .  #3 moderate regarding calorie malnutrition Has lost 20 pounds in the last 1-2 months due to newly diagnosed malignancy. Plan -stay off the metformin -patient eating somewhat better  #4 hyponatremia with hypercalcemia suggestive volume contracted state. Imaging thus far has not shown any overt bony metastases. Could potentially be paraneoplastic Plan -improved po intake. Will rpt labs on next visit. -recommend to drink atleast 48-64 oz of fluids daily -hold external calcium replacement at this time   continue follow-up with primary care physician for management of other medical comorbidities.  Labs today PRBC transfusion today x 2 units CT guided biopsy of adrenal lesion PET/CT and MRI brain ASAP for Renal cell cancer staging -RTC with Dr Irene Limbo in 10-14 days with labs/PET/MRI  All of the patients questions were answered with apparent satisfaction. The patient knows to call the clinic with any problems, questions or concerns.  I spent 30 minutes counseling the patient face to face. The total time spent in the appointment was 40 minutes and more than 50% was on counseling and direct patient cares.    Sullivan Lone MD [place] AAHIVMS [facility] Hematology/Oncology Physician [facility]  (Office):       [phone] (Work cell):  [phone] (Fax):  [phone]     

## 2016-07-01 ENCOUNTER — Telehealth: Payer: Self-pay | Admitting: Hematology

## 2016-07-01 NOTE — Telephone Encounter (Signed)
Appointment confirmed with patient, per 07/01/16 schd msg. 07/01/16

## 2016-07-02 ENCOUNTER — Telehealth: Payer: Self-pay | Admitting: Hematology

## 2016-07-02 ENCOUNTER — Other Ambulatory Visit: Payer: Self-pay | Admitting: *Deleted

## 2016-07-02 NOTE — Telephone Encounter (Signed)
FAXED RECORDS TO [facility] °

## 2016-07-17 ENCOUNTER — Other Ambulatory Visit: Payer: Self-pay | Admitting: Urology

## 2016-07-18 ENCOUNTER — Telehealth: Payer: Self-pay | Admitting: Hematology

## 2016-07-18 ENCOUNTER — Encounter: Payer: Self-pay | Admitting: Hematology

## 2016-07-18 ENCOUNTER — Ambulatory Visit (HOSPITAL_BASED_OUTPATIENT_CLINIC_OR_DEPARTMENT_OTHER): Payer: Medicare Other | Admitting: Hematology

## 2016-07-18 ENCOUNTER — Other Ambulatory Visit (HOSPITAL_BASED_OUTPATIENT_CLINIC_OR_DEPARTMENT_OTHER): Payer: Medicare Other

## 2016-07-18 ENCOUNTER — Other Ambulatory Visit: Payer: Self-pay | Admitting: *Deleted

## 2016-07-18 ENCOUNTER — Ambulatory Visit (HOSPITAL_BASED_OUTPATIENT_CLINIC_OR_DEPARTMENT_OTHER): Payer: Medicare Other

## 2016-07-18 VITALS — BP 152/71 | HR 96 | Temp 97.8°F | Resp 16

## 2016-07-18 DIAGNOSIS — C7970 Secondary malignant neoplasm of unspecified adrenal gland: Secondary | ICD-10-CM

## 2016-07-18 DIAGNOSIS — R911 Solitary pulmonary nodule: Secondary | ICD-10-CM | POA: Diagnosis not present

## 2016-07-18 DIAGNOSIS — E279 Disorder of adrenal gland, unspecified: Secondary | ICD-10-CM | POA: Diagnosis not present

## 2016-07-18 DIAGNOSIS — E440 Moderate protein-calorie malnutrition: Secondary | ICD-10-CM | POA: Diagnosis not present

## 2016-07-18 DIAGNOSIS — E871 Hypo-osmolality and hyponatremia: Secondary | ICD-10-CM

## 2016-07-18 DIAGNOSIS — C642 Malignant neoplasm of left kidney, except renal pelvis: Secondary | ICD-10-CM

## 2016-07-18 DIAGNOSIS — D649 Anemia, unspecified: Secondary | ICD-10-CM

## 2016-07-18 DIAGNOSIS — N390 Urinary tract infection, site not specified: Secondary | ICD-10-CM

## 2016-07-18 DIAGNOSIS — E2740 Unspecified adrenocortical insufficiency: Secondary | ICD-10-CM

## 2016-07-18 DIAGNOSIS — R918 Other nonspecific abnormal finding of lung field: Secondary | ICD-10-CM

## 2016-07-18 DIAGNOSIS — D509 Iron deficiency anemia, unspecified: Secondary | ICD-10-CM

## 2016-07-18 LAB — CBC WITH DIFFERENTIAL/PLATELET
BASO%: 0.5 % (ref 0.0–2.0)
BASO%: 1.3 % (ref 0.0–2.0)
Basophils Absolute: 0 10*3/uL (ref 0.0–0.1)
Basophils Absolute: 0.1 10*3/uL (ref 0.0–0.1)
EOS%: 0.8 % (ref 0.0–7.0)
EOS%: 1 % (ref 0.0–7.0)
Eosinophils Absolute: 0.1 10*3/uL (ref 0.0–0.5)
Eosinophils Absolute: 0.1 10*3/uL (ref 0.0–0.5)
HCT: 30.1 % — ABNORMAL LOW (ref 34.8–46.6)
HCT: 32.4 % — ABNORMAL LOW (ref 34.8–46.6)
HGB: 10 g/dL — ABNORMAL LOW (ref 11.6–15.9)
HGB: 10.7 g/dL — ABNORMAL LOW (ref 11.6–15.9)
LYMPH%: 17.7 % (ref 14.0–49.7)
LYMPH%: 17.8 % (ref 14.0–49.7)
MCH: 26 pg (ref 25.1–34.0)
MCH: 26 pg (ref 25.1–34.0)
MCHC: 32.9 g/dL (ref 31.5–36.0)
MCHC: 33.2 g/dL (ref 31.5–36.0)
MCV: 78.4 fL — ABNORMAL LOW (ref 79.5–101.0)
MCV: 78.9 fL — ABNORMAL LOW (ref 79.5–101.0)
MONO#: 1.1 10*3/uL — ABNORMAL HIGH (ref 0.1–0.9)
MONO#: 1.2 10*3/uL — ABNORMAL HIGH (ref 0.1–0.9)
MONO%: 13.5 % (ref 0.0–14.0)
MONO%: 14.1 % — ABNORMAL HIGH (ref 0.0–14.0)
NEUT#: 5.5 10*3/uL (ref 1.5–6.5)
NEUT#: 5.5 10*3/uL (ref 1.5–6.5)
NEUT%: 66.4 % (ref 38.4–76.8)
NEUT%: 66.9 % (ref 38.4–76.8)
Platelets: 441 10*3/uL — ABNORMAL HIGH (ref 145–400)
Platelets: 624 10*3/uL — ABNORMAL HIGH (ref 145–400)
RBC: 3.84 10*6/uL (ref 3.70–5.45)
RBC: 4.1 10*6/uL (ref 3.70–5.45)
RDW: 15.5 % — ABNORMAL HIGH (ref 11.2–14.5)
RDW: 16.8 % — ABNORMAL HIGH (ref 11.2–14.5)
WBC: 8.2 10*3/uL (ref 3.9–10.3)
WBC: 8.3 10*3/uL (ref 3.9–10.3)
lymph#: 1.5 10*3/uL (ref 0.9–3.3)
lymph#: 1.5 10*3/uL (ref 0.9–3.3)
nRBC: 0 % (ref 0–0)

## 2016-07-18 LAB — URINALYSIS, MICROSCOPIC - CHCC
Bilirubin (Urine): NEGATIVE
Blood: NEGATIVE
Glucose: NEGATIVE mg/dL
Ketones: NEGATIVE mg/dL
Nitrite: NEGATIVE
Protein: NEGATIVE mg/dL
Specific Gravity, Urine: 1.01 (ref 1.003–1.035)
Urobilinogen, UR: 0.2 mg/dL (ref 0.2–1)
pH: 6 (ref 4.6–8.0)

## 2016-07-18 LAB — COMPREHENSIVE METABOLIC PANEL
ALT: 13 U/L (ref 0–55)
AST: 11 U/L (ref 5–34)
Albumin: 3 g/dL — ABNORMAL LOW (ref 3.5–5.0)
Alkaline Phosphatase: 82 U/L (ref 40–150)
Anion Gap: 11 mEq/L (ref 3–11)
BUN: 17.9 mg/dL (ref 7.0–26.0)
CO2: 24 mEq/L (ref 22–29)
Calcium: 11.3 mg/dL — ABNORMAL HIGH (ref 8.4–10.4)
Chloride: 91 mEq/L — ABNORMAL LOW (ref 98–109)
Creatinine: 1.1 mg/dL (ref 0.6–1.1)
EGFR: 48 mL/min/{1.73_m2} — ABNORMAL LOW (ref >90)
Glucose: 116 mg/dl (ref 70–140)
Potassium: 4.7 mEq/L (ref 3.5–5.1)
Sodium: 125 mEq/L — ABNORMAL LOW (ref 136–145)
Total Bilirubin: 0.35 mg/dL (ref 0.20–1.20)
Total Protein: 8.1 g/dL (ref 6.4–8.3)

## 2016-07-18 NOTE — Patient Instructions (Signed)
Thank you for choosing [facility] to provide your oncology and hematology care.  To afford each patient quality time with our providers, please arrive 30 minutes before your scheduled appointment time.  If you arrive late for your appointment, you may be asked to reschedule.  We strive to give you quality time with our providers, and arriving late affects you and other patients whose appointments are after yours.  If you are a no show for multiple scheduled visits, you may be dismissed from the clinic at the providers discretion.   Again, thank you for choosing [facility], our hope is that these requests will decrease the amount of time that you wait before being seen by our physicians.  ______________________________________________________________________ Should you have questions after your visit to the [facility], please contact our office at [phone] between the hours of 8:30 and 4:30 p.m.    Voicemails left after 4:30p.m will not be returned until the following business day.   For prescription refill requests, please have your pharmacy contact us directly.  Please also try to allow 48 hours for prescription requests.   Please contact the scheduling department for questions regarding scheduling.  For scheduling of procedures such as PET scans, CT scans, MRI, Ultrasound, etc please contact central scheduling at [phone].   Resources For Cancer Patients and Caregivers:  American Cancer Society:  800-227-2345  Can help patients locate various types of support and financial assistance Cancer Care: 1-800-813-HOPE (4673) Provides financial assistance, online support groups, medication/co-pay assistance.   [place] DSS:  336-641-3447 Where to apply for food stamps, Medicaid, and utility assistance Medicare Rights Center: 800-333-4114 Helps people with Medicare understand their rights and benefits, navigate the Medicare system, and secure the  quality healthcare they deserve SCAT: 336-333-6589 [place] [employer]'s shared-ride transportation service for eligible riders who have a disability that prevents them from riding the fixed route bus.   For additional information on assistance programs please contact our social worker:   Grier Hock/Abigail Elmore:  [phone] 

## 2016-07-18 NOTE — Telephone Encounter (Signed)
Appointments scheduled per 3.29.18 LOS. Patient given AVS report and calendars with future scheduled appointments. °

## 2016-07-19 LAB — IRON AND TIBC
%SAT: 9 % — ABNORMAL LOW (ref 21–57)
Iron: 23 ug/dL — ABNORMAL LOW (ref 41–142)
TIBC: 251 ug/dL (ref 236–444)
UIBC: 228 ug/dL (ref 120–384)

## 2016-07-19 LAB — CORTISOL: Cortisol: 12.4 ug/dL

## 2016-07-19 LAB — FERRITIN: Ferritin: 367 ng/ml — ABNORMAL HIGH (ref 9–269)

## 2016-07-20 LAB — URINE CULTURE

## 2016-07-25 ENCOUNTER — Telehealth: Payer: Self-pay | Admitting: *Deleted

## 2016-07-25 MED ORDER — HYDROCORTISONE 10 MG PO TABS: ORAL_TABLET | ORAL | 1 refills | Status: DC

## 2016-07-25 NOTE — Telephone Encounter (Signed)
Per 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  Pt stated she is having nephrectomy in a few days and prefers not to being taking steriods at this time.  Message relayed to [name].

## 2016-07-25 NOTE — Progress Notes (Signed)
Marland Kitchen    HEMATOLOGY/ONCOLOGY CLINIC NOTE  Date of Service: ..07/18/2016  PCP: Kristine Linea MD ([place]) Endocrinolgy - Dr Derrill Kay GI- Dr Bubba Camp  CHIEF COMPLAINTS/PURPOSE OF CONSULTATION:  Renal cell carcinoma  Anemia  HISTORY OF PRESENTING ILLNESS:   Audrey [name] is a wonderful 76 y.o. female who has been referred to [name] by Dr Kristine Linea MD  for evaluation and management of concern for newly diagnosed Renal cell carcinoma.  Patient has a history of hypertension, dyslipidemia, diabetes then presented to her primary care physician with hematuria and a urinary tract infection on 03/13/2016. Patient notes she was noted to have an Escherichia coli urinary tract infection and was treated with ciprofloxacin. She was noted to have significant anemia with a positive fecal occult blood test and subsequently had an EGD and colonoscopy with Dr. Bubba Camp in January 2018 and December 2017 respectively. No overt evidence of bleeding. Was noted to have a hiatal hernia with esophagitis.  Patient notes that she was having lower abdominal and left flank pain and progressive weight loss of 20 pounds along with anemia with a hemoglobin of 8.4 .she also reports that she was having chills every night and was taking Aleve. Notes aggressive shortness of breath with increased dyspnea on exertion and fatigue. Increased nausea with decreased by mouth fluid intake and fluid intake.   Patient had a CT of the chest on 05/22/2016 due to her low left rib/flank plain. This showed indeterminate bilateral pulmonary nodules with the largest one in the right middle lobe. Indeterminate bilateral adrenal nodules were noted. 1.9 cm left adrenal nodule and 2.4 cm right adrenal nodule.  Patient subsequently had an MRI of the abdomen with and without contrast to evaluate adrenal nodules. This was done on 06/04/2016 and showed a large heterogenous enhancing mass involving the left interpolar and lower  pole of the kidney measuring 8.8 cm x 6.9 cm x 7.6 cm. Left renal vein was patent. No definitive evidence of invasion of the posterior oblique musculature. No ascites. No upper abdominal lymphadenopathy. Heterogenous enhancing bilateral adrenal nodules were noted measuring 2.2 cm on the right and 1.5 cm on the left.  Patient was referred to Dr. Hulen Shouts at [facility] and has an appointment on 06/27/2016 for urology evaluation.  She was referred to [name] for further evaluation of her anemia. Currently notes no active hematuria or rectal bleeding. No melena.  INTERVAL HISTORY  Patient is here for her scheduled follow-up to discuss results of her adrenal galnd bx which was consistent with metastatic RCC. She has been seen by Dr Alinda Money from urology and has been scheduled for a cytoreductive nephrectomy and U/L Adrenalectomy. I had discussed this with Dr Alinda Money. She is understandably anxious about the surgery but is clear that she would like to proceed with it. Hgb stable today at 10.7. Notes some grade 1 fatigue. No hypoglycemia or low BP to suggest overt adrenal insuff at this time but remains at high risk for this. We discussed that she will need systemic therapies after surgery for metastatic RCC. Patient notes that she will likely prefer a port-a-cath at some point since she has difficult venous access needing multiple sticks even for lab draws.  MEDICAL HISTORY:   Hypertension Dyslipidemia Osteoarthritis Ex-smoker Coronary artery disease Thyroid disorder-was apparently on levothyroxine 25 g daily which has subsequently been discontinued . 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 Earley Brooke Colonoscopy 03/2016 Dr. Earley Brooke  SOCIAL HISTORY: Social History   Social History  . Marital status: Married    Spouse name: N/A  . Number of children: N/A   . Years of education: N/A   Occupational History  . Not on file.   Social History Main Topics  . Smoking status: Former Smoker    Years: 8.00    Quit date: 06/18/1964  . Smokeless tobacco: Never Used  . Alcohol use No  . Drug use: No  . Sexual activity: Not on file   Other Topics Concern  . Not on file   Social History Narrative  . No narrative on file   Patient lives in [place] Works as a Solicitor part-time Ex-smoker quit long time ago In 1961 . Smoked 2 packs per day for 8 years prior to that .  or a lot of stress since her daughter is also getting treated for stage IV uterine cancer.  FAMILY HISTORY:  Mother deceased Father with asthma and emphysema died at 9 years with the MI. Brother deceased of heart disease   ALLERGIES:  is allergic to adhesive [tape] and nsaids.  MEDICATIONS:  Current Outpatient Prescriptions  Medication Sig Dispense Refill  . acetaminophen (TYLENOL) 500 MG tablet Take 1,000 mg by mouth 2 (two) times daily.     Marland Kitchen atorvastatin (LIPITOR) 40 MG tablet Take 40 mg by mouth daily at 6 PM.     . Cholecalciferol (VITAMIN D3) 5000 units TABS Take 1 tablet by mouth daily.    Marland Kitchen glimepiride (AMARYL) 1 MG tablet Take 1 mg by mouth every evening.     . hydrochlorothiazide (HYDRODIURIL) 25 MG tablet Take 25 mg by mouth daily.    Marland Kitchen lisinopril (PRINIVIL,ZESTRIL) 40 MG tablet Take 40 mg by mouth every evening.     . clobetasol ointment (TEMOVATE) 4.12 % Apply 1 application topically 2 (two) times daily.    Marland Kitchen levothyroxine (SYNTHROID, LEVOTHROID) 25 MCG tablet Take 25 mcg by mouth daily before breakfast.     No current facility-administered medications for this visit.     REVIEW OF SYSTEMS:    10 Point review of Systems was done is negative except as noted above.  PHYSICAL EXAMINATION: ECOG PERFORMANCE STATUS: 1 - Symptomatic but completely ambulatory  . Vitals:   07/18/16 1426  BP: (!) 152/71  Pulse: 96  Resp: 16  Temp: 97.8 F  (36.6 C)   There were no vitals filed for this visit. .There is no height or weight on file to calculate BMI.  GENERAL:alert, in no acute distress and comfortable SKIN: skin color, texture, turgor are normal, no rashes or significant lesions EYES: normal, conjunctiva are pink and non-injected, sclera clear OROPHARYNX:no exudate, no erythema and lips, buccal mucosa, and tongue normal  NECK: supple, no JVD, thyroid normal size, non-tender, without nodularity LYMPH:  no palpable lymphadenopathy in the cervical, axillary or inguinal LUNGS: clear to auscultation with normal respiratory effort HEART: regular rate & rhythm,  no murmurs and no lower extremity edema ABDOMEN: abdomen soft, non-tender, normoactive bowel sounds  Musculoskeletal: no cyanosis of digits and no clubbing  PSYCH: alert & oriented x 3 with fluent speech NEURO: no focal motor/sensory deficits  LABORATORY DATA:  I have reviewed the data as listed  . CBC Latest Ref Rng & Units 07/18/2016 07/18/2016 06/28/2016  WBC 3.9 - 10.3 10e3/uL 8.3 8.2 6.0  Hemoglobin 11.6 - 15.9 g/dL 10.7(L) 10.0(L) 11.3(L)  Hematocrit 34.8 - 46.6 %  32.4(L) 30.1(L) 35.3  Platelets 145 - 400 10e3/uL 624(H) 441(H) 438(H)    . CMP Latest Ref Rng & Units 07/18/2016 06/18/2016 06/18/2016  Glucose 70 - 140 mg/dl 116 100 -  BUN 7.0 - 26.0 mg/dL 17.9 25.7 -  Creatinine 0.6 - 1.1 mg/dL 1.1 1.2(H) -  Sodium 136 - 145 mEq/L 125(L) 130(L) -  Potassium 3.5 - 5.1 mEq/L 4.7 4.8 -  CO2 22 - 29 mEq/L 24 22 -  Calcium 8.4 - 10.4 mg/dL 11.3(H) 11.3(H) -  Total Protein 6.4 - 8.3 g/dL 8.1 8.3 7.0  Total Bilirubin 0.20 - 1.20 mg/dL 0.35 0.27 -  Alkaline Phos 40 - 150 U/L 82 84 -  AST 5 - 34 U/L 11 10 -  ALT 0 - 55 U/L 13 9 -   B12 level 299 (OSH)  Component     Latest Ref Rng & Units 06/18/2016  IgG (Immunoglobin G), Serum     700 - 1,600 mg/dL 832  IgA/Immunoglobulin A, Serum     64 - 422 mg/dL 169  IgM, Qn, Serum     26 - 217 mg/dL 60  Total Protein      6.0 - 8.5 g/dL 7.0  Albumin SerPl Elph-Mcnc     2.9 - 4.4 g/dL 3.0  Alpha 1     0.0 - 0.4 g/dL 0.5 (H)  Alpha2 Glob SerPl Elph-Mcnc     0.4 - 1.0 g/dL 1.4 (H)  B-Globulin SerPl Elph-Mcnc     0.7 - 1.3 g/dL 1.1  Gamma Glob SerPl Elph-Mcnc     0.4 - 1.8 g/dL 0.9  M Protein SerPl Elph-Mcnc     Not Observed g/dL Not Observed  Globulin, Total     2.2 - 3.9 g/dL 4.0 (H)  Albumin/Glob SerPl     0.7 - 1.7 0.8  IFE 1      Comment  Please Note (HCV):      Comment  Iron     41 - 142 ug/dL 16 (L)  TIBC     236 - 444 ug/dL 276  UIBC     120 - 384 ug/dL 261  %SAT     21 - 57 % 6 (L)  T3 Uptake Ratio     24 - 39 % 26  Free Thyroxine Index     1.2 - 4.9 2.3  Sed Rate     0 - 40 mm/hr 84 (H)  CRP     0.0 - 4.9 mg/L 119.9 (H)  LDH     125 - 245 U/L 92 (L)  Ferritin     9 - 269 ng/ml 195  Vitamin B12     232 - 1,245 pg/mL 338  Thyroxine (T4)     4.5 - 12.0 ug/dL 8.8  T4,Free(Direct)     0.82 - 1.77 ng/dL 1.29  TSH     0.308 - 3.960 m(IU)/L 2.642     RADIOGRAPHIC STUDIES: I have personally reviewed the radiological images as listed and agreed with the findings in the report. Mr Jeri Cos Wo Contrast  Result Date: 06/26/2016 CLINICAL DATA:  Metastatic renal cell carcinoma.  Staging. EXAM: MRI HEAD WITHOUT AND WITH CONTRAST TECHNIQUE: Multiplanar, multiecho pulse sequences of the brain and surrounding structures were obtained without and with intravenous contrast. CONTRAST:  62mL MULTIHANCE GADOBENATE DIMEGLUMINE 529 MG/ML IV SOLN COMPARISON:  None. FINDINGS: Brain: Mild atrophy. Negative for hydrocephalus. Negative for acute or chronic infarction. Negative for hemorrhage or mass Normal enhancement postcontrast administration. No evidence  of metastatic deposits in the brain. Vascular: Normal arterial flow void. Skull and upper cervical spine: Negative Sinuses/Orbits: Negative Other: None IMPRESSION: Negative for metastatic disease to the brain.  No acute abnormality. Electronically  Signed   By: Franchot Gallo M.D.   On: 06/26/2016 14:11   Nm Pet Image Initial (pi) Skull Base To Thigh  Result Date: 06/26/2016 CLINICAL DATA:  Initial treatment strategy for renal cell carcinoma. EXAM: NUCLEAR MEDICINE PET SKULL BASE TO THIGH TECHNIQUE: 6.35 mCi F-18 FDG was injected intravenously. Full-ring PET imaging was performed from the skull base to thigh after the radiotracer. CT data was obtained and used for attenuation correction and anatomic localization. FASTING BLOOD GLUCOSE:  Value: 114 mg/dl COMPARISON:  05/22/2016 FINDINGS: NECK No hypermetabolic lymph nodes in the neck. CHEST No hypermetabolic mediastinal or hilar nodes. Aortic atherosclerosis. Calcifications involving the RCA, LAD and left circumflex coronary artery. Pulmonary nodule within the right middle lobe is too small to characterize measuring 5 mm, image 37 of series 8. Right lower lobe pulmonary nodule is also too small to characterize measuring 3 mm, image 35 of series 8. ABDOMEN/PELVIS No abnormal hypermetabolic activity within the liver, pancreas, or spleen. Stones within the gallbladder noted. Right adrenal nodule measures 2.2 cm and 21 HU. SUV max is equal to 2.46. The left adrenal nodule measures 1.7 cm and 25 HU. SUV max associated with this nodule is equal to 2.74. Large irregular and heterogeneous mass arising from the inferior pole of the left kidney is again noted measuring 8.1 by 6.7 cm with an SUV max equal to 9.2. 7 mm periaortic node has an SUV max equal to 2.5. SKELETON No focal hypermetabolic activity to suggest skeletal metastasis. IMPRESSION: 1. Mass arising from the inferior pole of the left kidney is identified compatible with suspected renal cell carcinoma. 2. Bilateral adrenal nodules with mild increased radiotracer uptake. 3. Small pulmonary nodules in the right lung are too small to reliably characterize by PET-CT. These remain nonspecific however metastatic disease cannot be excluded. 4. Gallstones.  Electronically Signed   By: Kerby Moors M.D.   On: 06/26/2016 12:43   Ct Biopsy  Result Date: 06/27/2016 INDICATION: Concern for metastatic renal cell carcinoma. Please perform CT-guided adrenal nodule biopsy for tissue diagnostic purposes. EXAM: CT-GUIDED BIOPSY OF INDETERMINATE RIGHT ADRENAL GLAND NODULE COMPARISON:  Chest CT - 05/22/2016; abdominal MRI - 06/04/2016; PET-CT - 06/26/2016 MEDICATIONS: None. ANESTHESIA/SEDATION: Fentanyl 50 mcg IV; Versed 2 mg IV Sedation time: 15 minutes; The patient was continuously monitored during the procedure by the interventional radiology nurse under my direct supervision. CONTRAST:  None. COMPLICATIONS: None immediate. PROCEDURE: Informed consent was obtained from the patient following an explanation of the procedure, risks, benefits and alternatives. A time out was performed prior to the initiation of the procedure. The patient was positioned right lateral decubitus on the CT table and a limited CT was performed for procedural planning demonstrating unchanged size and appearance of the approximately 2.3 x 1.7 cm right adrenal gland nodule (image 15, series 2). The procedure was planned. The operative site was prepped and draped in the usual sterile fashion. Appropriate trajectory was confirmed with a 22 gauge spinal needle after the adjacent tissues were anesthetized with 1% Lidocaine with epinephrine. Under intermittent CT guidance, a 17 gauge coaxial needle was advanced into the peripheral aspect of the mass. Appropriate positioning was confirmed and 5 core needle biopsy samples were obtained with an 18 gauge core needle biopsy device. The co-axial needle was removed following the  administration of a Gel-Foam slurry and superficial hemostasis was achieved with manual compression. A limited postprocedural CT was negative for hemorrhage or additional complication. A dressing was placed. The patient tolerated the procedure well without immediate postprocedural  complication. IMPRESSION: Technically successful CT guided core needle biopsy of indeterminate right adrenal gland nodule. Electronically Signed   By: Sandi Mariscal M.D.   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 PET/CT is consistent with left kidney renal cell carcinoma. Adrenal nodules are only minimally FDG avid and of uncertain etiology. One of these has been biopsied. Biopsy results pending. Pulmonary nodules are too small to evaluate but cannot r/o RCC.Marland Kitchen   MRI brain shows no evidence of metastatic disease  Plan -appreciate input but urology Dr Alinda Money. Patient is here for scheduled for cytoreductive nephrectomy and u/l adrenelectomy on 08/01/2016 -hgb is stable -will need a clearance from her primary care physician from a medical standpoint for the surgery -we will see her back in a couple of weeks of surgery to discuss systemic treatments for her metastatic RCC. -stress dose steroids peri-operatively (hydrocortisone 50mg  IV q8-12h x 2-3 days)  #2  Microcytic hypochromic anemia This primarily appears to be related to anemia of chronic disease related to her newly diagnosed malignancy. This is consistent with her significant elevated inflammatory markers including sedimentation rate and CRP. Thyroid function is within normal limits. LDH within normal limits and suggests no evidence of hemolysis. . Lab Results  Component Value Date   IRON 23 (L) 07/18/2016   TIBC 251 07/18/2016   IRONPCTSAT 9 (L) 07/18/2016   (Iron and TIBC)  Lab Results  Component Value Date   FERRITIN 367 (H) 07/18/2016  Plan -Patient's hemoglobin stable at 10.7 -transfuse prn to maintain hgb>= 10 peri-operatively.  #3 moderate regarding calorie malnutrition Has lost 20 pounds in the last  1-2 months due to newly diagnosed malignancy. Plan -stay off the metformin -patient eating somewhat better  #4 hyponatremia with hypercalcemia suggestive volume contracted state. Imaging thus far has not shown any overt bony metastases. 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 Plan -recommend to drink atleast 48-64 oz of fluids daily -increase salt intake in the diet. -will start on hydrocortisone 20mg  AM and 10AM noon for adrenal insufficiency -hold external calcium replacement at this time -will need rpt labs pre-op   continue follow-up with primary care physician for management of other medical comorbidities.  Additional labs today RTC with Dr Irene Limbo in 3weeks with labs  All of the patients questions were answered with apparent satisfaction. The patient knows to call the clinic with any problems, questions or concerns.  I spent 30 minutes counseling the patient face to face. The total time spent in the appointment was 40 minutes and more than 50% was on counseling and direct patient cares.    Sullivan Lone MD [place] AAHIVMS [facility] Hematology/Oncology Physician [facility]  (Office):       [phone] (Work cell):  [phone] (Fax):           [phone]

## 2016-07-26 ENCOUNTER — Telehealth: Payer: Self-pay | Admitting: *Deleted

## 2016-07-26 NOTE — Telephone Encounter (Signed)
Received vm call from pt stating that she was returning a call to [employer] states that she is on the way to p/u steroids & will start today. Message given to [facility].

## 2016-07-26 NOTE — Patient Instructions (Addendum)
Audrey [name]  07/26/2016   Your procedure is scheduled on: 08/01/2016    Report to [facility] Main  Entrance Take [place]  elevators to 3rd floor to  [place] AM.   Call this number if you have problems the morning of surgery [phone]    Remember: ONLY 1 PERSON MAY GO WITH YOU TO SHORT STAY TO GET  READY MORNING OF YOUR SURGERY.  Do not eat food or drink liquids :After Midnight.     Take these medicines the morning of surgery with A SIP OF WATER:  Levothyroxine (Synthroid)  DO NOT TAKE ANY DIABETIC MEDICATIONS DAY OF YOUR SURGERY                               You may not have any metal on your body including hair pins and              piercings  Do not wear jewelry, make-up, lotions, powders or perfumes, deodorant             Do not wear nail polish.  Do not shave  48 hours prior to surgery.              Do not bring valuables to the hospital. [place].  Contacts, dentures or bridgework may not be worn into surgery.  Leave suitcase in the car. After surgery it may be brought to your room.                   Please read over the following fact sheets you were given: _____________________________________________________________________  [facility] - Preparing for Surgery Before surgery, you can play an important role.  Because skin is not sterile, your skin needs to be as free of germs as possible.  You can reduce the number of germs on your skin by washing with CHG (chlorahexidine gluconate) soap before surgery.  CHG is an antiseptic cleaner which kills germs and bonds with the skin to continue killing germs even after washing. Please DO NOT use if you have an allergy to CHG or antibacterial soaps.  If your skin becomes reddened/irritated stop using the CHG and inform your nurse when you arrive at Short Stay. Do not shave (including legs and underarms) for at least 48 hours prior to the first  CHG shower.  You may shave your face/neck. Please follow these instructions carefully:  1.  Shower with CHG Soap the night before surgery and the  morning of Surgery.  2.  If you choose to wash your hair, wash your hair first as usual with your  normal  shampoo.  3.  After you shampoo, rinse your hair and body thoroughly to remove the  shampoo.                           4.  Use CHG as you would any other liquid soap.  You can apply chg directly  to the skin and wash                       Gently with a scrungie or clean washcloth.  5.  Apply the CHG Soap to your  body ONLY FROM THE NECK DOWN.   Do not use on face/ open                           Wound or open sores. Avoid contact with eyes, ears mouth and genitals (private parts).                       Wash face,  Genitals (private parts) with your normal soap.             6.  Wash thoroughly, paying special attention to the area where your surgery  will be performed.  7.  Thoroughly rinse your body with warm water from the neck down.  8.  DO NOT shower/wash with your normal soap after using and rinsing off  the CHG Soap.                9.  Pat yourself dry with a clean towel.            10.  Wear clean pajamas.            11.  Place clean sheets on your bed the night of your first shower and do not  sleep with pets. Day of Surgery : Do not apply any lotions/deodorants the morning of surgery.  Please wear clean clothes to the hospital/surgery center.  FAILURE TO FOLLOW THESE INSTRUCTIONS MAY RESULT IN THE CANCELLATION OF YOUR SURGERY PATIENT SIGNATURE_________________________________  NURSE SIGNATURE__________________________________  ________________________________________________________________________             Adam Phenix  An incentive spirometer is a tool that can help keep your lungs clear and active. This tool measures how well you are filling your lungs with each breath. Taking long deep breaths may help reverse or  decrease the chance of developing breathing (pulmonary) problems (especially infection) following:  A long period of time when you are unable to move or be active. BEFORE THE PROCEDURE   If the spirometer includes an indicator to show your best effort, your nurse or respiratory therapist will set it to a desired goal.  If possible, sit up straight or lean slightly forward. Try not to slouch.  Hold the incentive spirometer in an upright position. INSTRUCTIONS FOR USE  1. Sit on the edge of your bed if possible, or sit up as far as you can in bed or on a chair. 2. Hold the incentive spirometer in an upright position. 3. Breathe out normally. 4. Place the mouthpiece in your mouth and seal your lips tightly around it. 5. Breathe in slowly and as deeply as possible, raising the piston or the ball toward the top of the column. 6. Hold your breath for 3-5 seconds or for as long as possible. Allow the piston or ball to fall to the bottom of the column. 7. Remove the mouthpiece from your mouth and breathe out normally. 8. Rest for a few seconds and repeat Steps 1 through 7 at least 10 times every 1-2 hours when you are awake. Take your time and take a few normal breaths between deep breaths. 9. The spirometer may include an indicator to show your best effort. Use the indicator as a goal to work toward during each repetition. 10. After each set of 10 deep breaths, practice coughing to be sure your lungs are clear. If you have an incision (the cut made at the time of surgery), support your incision when coughing by  placing a pillow or rolled up towels firmly against it. Once you are able to get out of bed, walk around indoors and cough well. You may stop using the incentive spirometer when instructed by your caregiver.  RISKS AND COMPLICATIONS  Take your time so you do not get dizzy or light-headed.  If you are in pain, you may need to take or ask for pain medication before doing incentive spirometry.  It is harder to take a deep breath if you are having pain. AFTER USE  Rest and breathe slowly and easily.  It can be helpful to keep track of a log of your progress. Your caregiver can provide you with a simple table to help with this. If you are using the spirometer at home, follow these instructions: [place] IF:   You are having difficultly using the spirometer.  You have trouble using the spirometer as often as instructed.  Your pain medication is not giving enough relief while using the spirometer.  You develop fever of 100.5 F (38.1 C) or higher. SEEK IMMEDIATE MEDICAL CARE IF:   You cough up bloody sputum that had not been present before.  You develop fever of 102 F (38.9 C) or greater.  You develop worsening pain at or near the incision site. MAKE SURE YOU:   Understand these instructions.  Will watch your condition.  Will get help right away if you are not doing well or get worse. Document Released: 08/19/2006 Document Revised: 07/01/2011 Document Reviewed: 10/20/2006 ExitCare Patient Information 2014 ExitCare, [place].   ________________________________________________________________________   WHAT IS A BLOOD TRANSFUSION? Blood Transfusion Information  A transfusion is the replacement of blood or some of its parts. Blood is made up of multiple cells which provide different functions.  Red blood cells carry oxygen and are used for blood loss replacement.  White blood cells fight against infection.  Platelets control bleeding.  Plasma helps clot blood.  Other blood products are available for specialized needs, such as hemophilia or other clotting disorders. BEFORE THE TRANSFUSION  Who gives blood for transfusions?   Healthy volunteers who are fully evaluated to make sure their blood is safe. This is blood bank blood. Transfusion therapy is the safest it has ever been in the practice of medicine. Before blood is taken from a donor, a complete  history is taken to make sure that person has no history of diseases nor engages in risky social behavior (examples are intravenous drug use or sexual activity with multiple partners). The donor's travel history is screened to minimize risk of transmitting infections, such as malaria. The donated blood is tested for signs of infectious diseases, such as HIV and hepatitis. The blood is then tested to be sure it is compatible with you in order to minimize the chance of a transfusion reaction. If you or a relative donates blood, this is often done in anticipation of surgery and is not appropriate for emergency situations. It takes many days to process the donated blood. RISKS AND COMPLICATIONS Although transfusion therapy is very safe and saves many lives, the main dangers of transfusion include:   Getting an infectious disease.  Developing a transfusion reaction. This is an allergic reaction to something in the blood you were given. Every precaution is taken to prevent this. The decision to have a blood transfusion has been considered carefully by your caregiver before blood is given. Blood is not given unless the benefits outweigh the risks. AFTER THE TRANSFUSION  Right after receiving a  blood transfusion, you will usually feel much better and more energetic. This is especially true if your red blood cells have gotten low (anemic). The transfusion raises the level of the red blood cells which carry oxygen, and this usually causes an energy increase.  The nurse administering the transfusion will monitor you carefully for complications. HOME CARE INSTRUCTIONS  No special instructions are needed after a transfusion. You may find your energy is better. Speak with your caregiver about any limitations on activity for underlying diseases you may have. SEEK MEDICAL CARE IF:   Your condition is not improving after your transfusion.  You develop redness or irritation at the intravenous (IV) site. SEEK  IMMEDIATE MEDICAL CARE IF:  Any of the following symptoms occur over the next 12 hours:  Shaking chills.  You have a temperature by mouth above 102 F (38.9 C), not controlled by medicine.  Chest, back, or muscle pain.  People around you feel you are not acting correctly or are confused.  Shortness of breath or difficulty breathing.  Dizziness and fainting.  You get a rash or develop hives.  You have a decrease in urine output.  Your urine turns a dark color or changes to pink, red, or brown. Any of the following symptoms occur over the next 10 days:  You have a temperature by mouth above 102 F (38.9 C), not controlled by medicine.  Shortness of breath.  Weakness after normal activity.  The white part of the eye turns yellow (jaundice).  You have a decrease in the amount of urine or are urinating less often.  Your urine turns a dark color or changes to pink, red, or brown. Document Released: 04/05/2000 Document Revised: 07/01/2011 Document Reviewed: 11/23/2007 [facility] Patient Information 2014 [place], [place].  _______________________________________________________________________

## 2016-07-26 NOTE — Telephone Encounter (Signed)
Per staff message, LVM with patient explaining the reason for prescribed hydrocortisone.  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  Also explained that this is a low/maintenance level steriod, and [name] is strongly recommending pt take rx to improve outcome of upcoming surgery.  Call back number provided.

## 2016-07-29 ENCOUNTER — Other Ambulatory Visit: Payer: Self-pay | Admitting: Urology

## 2016-07-29 ENCOUNTER — Telehealth: Payer: Self-pay | Admitting: Medical Oncology

## 2016-07-29 ENCOUNTER — Encounter (HOSPITAL_COMMUNITY): Payer: Self-pay

## 2016-07-29 ENCOUNTER — Encounter (HOSPITAL_COMMUNITY)
Admission: RE | Admit: 2016-07-29 | Discharge: 2016-07-29 | Disposition: A | Discharge status: Home / Self Care | Payer: Medicare Other | Source: Ambulatory Visit | Attending: Urology | Admitting: Urology

## 2016-07-29 DIAGNOSIS — Z01812 Encounter for preprocedural laboratory examination: Secondary | ICD-10-CM

## 2016-07-29 DIAGNOSIS — C642 Malignant neoplasm of left kidney, except renal pelvis: Secondary | ICD-10-CM | POA: Insufficient documentation

## 2016-07-29 LAB — BASIC METABOLIC PANEL
Anion gap: 7 (ref 5–15)
BUN: 27 mg/dL — ABNORMAL HIGH (ref 6–20)
CO2: 25 mmol/L (ref 22–32)
Calcium: 9.6 mg/dL (ref 8.9–10.3)
Chloride: 100 mmol/L — ABNORMAL LOW (ref 101–111)
Creatinine, Ser: 1.3 mg/dL — ABNORMAL HIGH (ref 0.44–1.00)
GFR calc Af Amer: 46 mL/min — ABNORMAL LOW (ref >60)
GFR calc non Af Amer: 39 mL/min — ABNORMAL LOW (ref >60)
Glucose, Bld: 229 mg/dL — ABNORMAL HIGH (ref 65–99)
Potassium: 5.2 mmol/L — ABNORMAL HIGH (ref 3.5–5.1)
Sodium: 132 mmol/L — ABNORMAL LOW (ref 135–145)

## 2016-07-29 LAB — GLUCOSE, CAPILLARY: Glucose-Capillary: 216 mg/dL — ABNORMAL HIGH (ref 65–99)

## 2016-07-29 LAB — CBC
HCT: 29.1 % — ABNORMAL LOW (ref 36.0–46.0)
Hemoglobin: 9.2 g/dL — ABNORMAL LOW (ref 12.0–15.0)
MCH: 26.1 pg (ref 26.0–34.0)
MCHC: 31.6 g/dL (ref 30.0–36.0)
MCV: 82.4 fL (ref 78.0–100.0)
Platelets: 480 K/uL — ABNORMAL HIGH (ref 150–400)
RBC: 3.53 MIL/uL — ABNORMAL LOW (ref 3.87–5.11)
RDW: 15.9 % — ABNORMAL HIGH (ref 11.5–15.5)
WBC: 10.7 K/uL — ABNORMAL HIGH (ref 4.0–10.5)

## 2016-07-29 NOTE — Progress Notes (Signed)
During preop appt verified with pt what time of day she take Amaryl. Pt indicated that she takes it at night. Advised pt that per Diabetic Medication Adjustment Guidelines, it is recommended that she takes only at morning and/or lunch, the day before the procedure. Pt indicated that she would like to discuss this with her doctor, because the prednisone medication that she is on, tends to increase her blood glucose at night.  Contacted pt via phone to see if she had contacted her MD to determine when he would like her to take her Amaryl. Pt indicated that she had not, but would take per Diabetic Medication Adjustment Guideline. Now agreeable to take medication at lunch, the day prior to the procedure.

## 2016-07-29 NOTE — Progress Notes (Signed)
Consent for surgery on 08/01/2016 .  Should it not read nephrectomy? .  Thanks.

## 2016-07-29 NOTE — Progress Notes (Addendum)
07-29-16 CBC and BMP results routed to Dr. Alinda Money for review.

## 2016-07-29 NOTE — Telephone Encounter (Signed)
Call forwarded to Dr Pollie Meyer nurse.

## 2016-07-30 LAB — HEMOGLOBIN A1C
Hgb A1c MFr Bld: 6.6 % — ABNORMAL HIGH (ref 4.8–5.6)
Mean Plasma Glucose: 143 mg/dL

## 2016-07-31 NOTE — H&P (Signed)
Office Visit Report     07/12/2016   --------------------------------------------------------------------------------   Audrey [name]  MRN: [id number]  PRIMARY CARE:    DOB: 02-24-1941, 76 year old Female  REFERRING:    SSN: [id number]  PROVIDER:  Raynelle Bring, M.D.    LOCATION:  [facility] [phone]   --------------------------------------------------------------------------------   CC/HPI: Metastatic renal cell carcinoma   Audrey [name] is a 76 year old female seen today at the request of [name] to consider a possible cytoreductive left nephrectomy for metastatic renal cell carcinoma. She had presented to her primary care physician in November 2017 and was found to have an E. coli urinary tract infection. In addition, she was incidentally found to be anemic with a hemoglobin of approximately 8.4. She also lost approximately 20 pounds over the past few months with worsening appetite. She also complains of night sweats that occurred beginning a few months ago. She subsequently underwent further evaluation with an EGD and colonoscopy that was unremarkable. A CT scan of the chest did reveal bilateral pulmonary nodules and bilateral adrenal nodules. This prompted a full MRI of the abdomen that demonstrated a large left enhancing renal tumor measuring over 8 cm concerning for renal cell carcinoma. She subsequently has undergone a biopsy of her right adrenal mass that measured about 2.4 cm. 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 She also has since undergone a full PET scan that confirmed her bilateral pulmonary nodules including 2 small indeterminate nodules on the right and 1 pleural-based lesion on the left. These do not have significant uptake on PET imaging but are too small to fully characterize. She did have moderate uptake of her adrenal glands and significant uptake of her primary left renal tumor. No other areas of metastatic disease have been identified.   She has  denied any gross hematuria. She has no family history of kidney cancer. She has no family history of renal failure.   Her past medical history is significant for coronary artery disease status post a myocardial infarction in 1991. She apparently is followed yearly by Dr. [name] in [place], [place]. She has not undergone a recent stress test. She denies any recent chest pain or shortness of breath.     ALLERGIES: NSAIDs Tape    MEDICATIONS: Hydrochlorothiazide  Levothyroxine Sodium  Lipitor  Lisinopril  Cholecalciferol  Glimepiride  Tylenol     GU PSH: None   NON-GU PSH: None   GU PMH: Vesicointestinal fistula    NON-GU PMH: Arthritis Diabetes Type 2 Heart disease, unspecified Hypercholesterolemia Hypertension    FAMILY HISTORY: 2 sons - Runs in Family 3 daughters - Runs in Family   SOCIAL HISTORY: Marital Status: Married Current Smoking Status: Patient has never smoked.   Tobacco Use Assessment Completed: Used Tobacco in last 30 days? Has never drank.  Drinks 1 caffeinated drink per day.    REVIEW OF SYSTEMS:    GU Review Female:   Patient reports hard to postpone urination, get up at night to urinate, and leakage of urine. Patient denies frequent urination, burning /pain with urination, stream starts and stops, trouble starting your stream, have to strain to urinate, and currently pregnant.  Gastrointestinal (Lower):   Patient denies diarrhea and constipation.  Gastrointestinal (Upper):   Patient denies nausea and vomiting.  Constitutional:   Patient reports night sweats, weight loss, and fatigue. Patient denies fever.  Skin:   Patient denies skin rash/ lesion and itching.  Eyes:   Patient denies blurred  vision and double vision.  Ears/ Nose/ Throat:   Patient denies sore throat and sinus problems.  Hematologic/Lymphatic:   Patient denies swollen glands and easy bruising.  Cardiovascular:   Patient reports leg swelling. Patient denies chest pains.   Respiratory:   Patient denies cough and shortness of breath.  Endocrine:   Patient denies excessive thirst.  Musculoskeletal:   Patient reports joint pain. Patient denies back pain.  Neurological:   Patient denies headaches and dizziness.  Psychologic:   Patient denies depression and anxiety.   VITAL SIGNS:      07/12/2016 11:53 AM  Weight 128 lb / 58.06 kg  Height 60 in / 152.4 cm  BP 131/72 mmHg  Pulse 96 /min  BMI 25.0 kg/m   MULTI-SYSTEM PHYSICAL EXAMINATION:    Constitutional: Well-nourished. No physical deformities. Normally developed. Good grooming.  Neck: Neck symmetrical, not swollen. Normal tracheal position.  Respiratory: No labored breathing, no use of accessory muscles. Clear bilaterally.  Cardiovascular: Normal temperature, normal extremity pulses, no swelling, no varicosities. Regular rate and rhythm.  Lymphatic: No enlargement of neck, axillae, groin.  Skin: No paleness, no jaundice, no cyanosis. No lesion, no ulcer, no rash.  Neurologic / Psychiatric: Oriented to time, oriented to place, oriented to person. No depression, no anxiety, no agitation.  Gastrointestinal: No mass, no tenderness, no rigidity, non obese abdomen. She has a reducible umbilical hernia.  Eyes: Normal conjunctivae. Normal eyelids.  Ears, Nose, Mouth, and Throat: Left ear no scars, no lesions, no masses. Right ear no scars, no lesions, no masses. Nose no scars, no lesions, no masses. Normal hearing. Normal lips.  Musculoskeletal: Normal gait and station of head and neck.     PAST DATA REVIEWED:  Source Of History:  Patient  Lab Test Review:   CBC with Diff, CMP  Records Review:   Pathology Reports, Previous Patient Records  Urine Test Review:   Urinalysis  Notes:                     Serum creatinine is 1.2. Liver function tests are normal. Hemoglobin was 8.4 but was over 10 post transfusion.   Urinalysis appears contaminated.   PROCEDURES:          Urinalysis w/Scope Dipstick Dipstick  Cont'd Micro  Color: Yellow Bilirubin: Neg WBC/hpf: 6 - 10/hpf  Appearance: Clear Ketones: Neg RBC/hpf: NS (Not Seen)  Specific Gravity: 1.020 Blood: Neg Bacteria: Few (10-25/hpf)  pH: 6.0 Protein: Neg Cystals: NS (Not Seen)  Glucose: Neg Urobilinogen: 0.2 Casts: NS (Not Seen)    Nitrites: Neg Trichomonas: Not Present    Leukocyte Esterase: 2+ Mucous: Not Present      Epithelial Cells: 6 - 10/hpf      Yeast: NS (Not Seen)      Sperm: Not Present    ASSESSMENT:      ICD-10 Details  1 GU:   Kidney Cancer Left, expect renal pelvis - C64.2   2 NON-GU:   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 Metastatic renal cell carcinoma: I had a detailed discussion with Ms. Soderholm and her husband today. I informed her that she does indeed appear to have metastatic renal cell carcinoma. We discussed the natural history of this disease and the fact that it is not curable but is treatable. We discussed that she has a large primary left renal tumor with metastases to what appears to be the bilateral adrenal glands. She also likely has pulmonary metastases although these lesions are technically indeterminate.   We reviewed the pros and cons of cytoreductive nephrectomy and I explained that this would not be curative but potentially would make systemic therapy more effective. I did recommend that she have a preoperative cardiac evaluation considering her history of myocardial infarction and considering she has not had a recent stress test. Assuming that she is felt to be at relatively low risk for her cardiologist, I do think that proceeding with a left laparoscopic radical nephrectomy would be beneficial. I  have spoken with [name] today and he was in agreement. Our current plan is to proceed with cytoreductive nephrectomy and consider follow-up imaging in approximately 3 months to reassess her pulmonary nodules and her right adrenal gland. If her pulmonary nodules do not appear to be progressive, we may consider the option of right adrenalectomy albeit with the knowledge that this would render her dependent on steroid replacement for the rest of her life.   We have discussed the risks of treatment in detail including but not limited to bleeding, infection, heart attack, stroke, death, venothromoboembolism, cancer recurrence, injury/damage to surrounding organs and structures, urine leak, the possibility of open surgical conversion for patients undergoing minimally invasive surgery, the risk of developing chronic kidney disease and its associated implications, and the potential risk of end stage renal disease possibly necessitating dialysis.    She will see [name] next week and is scheduled to have repeat labs to check her hemoglobin. She will be scheduled to see Dr. Sabra Heck for preoperative cardiac risk assessment. She will be tentatively scheduled to proceed with a left laparoscopic radical nephrectomy in the near future. We also discussed repairing her umbilical hernia at the time of her surgery at her request.   Cc: Dr. [name] - [place], [place]  Dr. Kristine Linea    * Signed by Raynelle Bring, M.D. on 07/12/16 at 10:00 PM (EDT)*

## 2016-08-01 ENCOUNTER — Inpatient Hospital Stay (HOSPITAL_COMMUNITY): Payer: Medicare Other | Admitting: Certified Registered Nurse Anesthetist

## 2016-08-01 ENCOUNTER — Inpatient Hospital Stay (HOSPITAL_COMMUNITY)
Admission: RE | Admit: 2016-08-01 | Discharge: 2016-08-03 | DRG: 657 | Disposition: A | Discharge status: Home / Self Care | Payer: Medicare Other | Source: Ambulatory Visit | Attending: Urology | Admitting: Urology

## 2016-08-01 ENCOUNTER — Encounter (HOSPITAL_COMMUNITY): Payer: Self-pay | Admitting: *Deleted

## 2016-08-01 ENCOUNTER — Encounter (HOSPITAL_COMMUNITY)
Admission: RE | Disposition: A | Discharge status: Home / Self Care | Payer: Self-pay | Source: Ambulatory Visit | Attending: Urology

## 2016-08-01 DIAGNOSIS — Z91048 Other nonmedicinal substance allergy status: Secondary | ICD-10-CM

## 2016-08-01 DIAGNOSIS — E785 Hyperlipidemia, unspecified: Secondary | ICD-10-CM | POA: Diagnosis present

## 2016-08-01 DIAGNOSIS — Z7952 Long term (current) use of systemic steroids: Secondary | ICD-10-CM | POA: Diagnosis not present

## 2016-08-01 DIAGNOSIS — C7972 Secondary malignant neoplasm of left adrenal gland: Secondary | ICD-10-CM | POA: Diagnosis present

## 2016-08-01 DIAGNOSIS — C642 Malignant neoplasm of left kidney, except renal pelvis: Secondary | ICD-10-CM | POA: Diagnosis present

## 2016-08-01 DIAGNOSIS — Z87891 Personal history of nicotine dependence: Secondary | ICD-10-CM

## 2016-08-01 DIAGNOSIS — C7971 Secondary malignant neoplasm of right adrenal gland: Secondary | ICD-10-CM | POA: Diagnosis present

## 2016-08-01 DIAGNOSIS — Z888 Allergy status to other drugs, medicaments and biological substances status: Secondary | ICD-10-CM | POA: Diagnosis not present

## 2016-08-01 DIAGNOSIS — I10 Essential (primary) hypertension: Secondary | ICD-10-CM | POA: Diagnosis present

## 2016-08-01 DIAGNOSIS — C7801 Secondary malignant neoplasm of right lung: Secondary | ICD-10-CM | POA: Diagnosis present

## 2016-08-01 DIAGNOSIS — I252 Old myocardial infarction: Secondary | ICD-10-CM | POA: Diagnosis not present

## 2016-08-01 DIAGNOSIS — E119 Type 2 diabetes mellitus without complications: Secondary | ICD-10-CM | POA: Diagnosis present

## 2016-08-01 DIAGNOSIS — G8918 Other acute postprocedural pain: Secondary | ICD-10-CM | POA: Diagnosis not present

## 2016-08-01 DIAGNOSIS — Z7984 Long term (current) use of oral hypoglycemic drugs: Secondary | ICD-10-CM

## 2016-08-01 DIAGNOSIS — C7802 Secondary malignant neoplasm of left lung: Secondary | ICD-10-CM | POA: Diagnosis present

## 2016-08-01 DIAGNOSIS — K429 Umbilical hernia without obstruction or gangrene: Secondary | ICD-10-CM | POA: Diagnosis present

## 2016-08-01 DIAGNOSIS — N2889 Other specified disorders of kidney and ureter: Secondary | ICD-10-CM | POA: Diagnosis present

## 2016-08-01 HISTORY — PX: LAPAROSCOPIC NEPHRECTOMY: SHX1930

## 2016-08-01 LAB — GLUCOSE, CAPILLARY
Glucose-Capillary: 211 mg/dL — ABNORMAL HIGH (ref 65–99)
Glucose-Capillary: 178 mg/dL — ABNORMAL HIGH (ref 65–99)
Glucose-Capillary: 186 mg/dL — ABNORMAL HIGH (ref 65–99)

## 2016-08-01 LAB — BASIC METABOLIC PANEL
Anion gap: 11 (ref 5–15)
BUN: 22 mg/dL — ABNORMAL HIGH (ref 6–20)
CO2: 23 mmol/L (ref 22–32)
Calcium: 8.8 mg/dL — ABNORMAL LOW (ref 8.9–10.3)
Chloride: 96 mmol/L — ABNORMAL LOW (ref 101–111)
Creatinine, Ser: 1.05 mg/dL — ABNORMAL HIGH (ref 0.44–1.00)
GFR calc Af Amer: 59 mL/min — ABNORMAL LOW (ref >60)
GFR calc non Af Amer: 51 mL/min — ABNORMAL LOW (ref >60)
Glucose, Bld: 211 mg/dL — ABNORMAL HIGH (ref 65–99)
Potassium: 3.7 mmol/L (ref 3.5–5.1)
Sodium: 130 mmol/L — ABNORMAL LOW (ref 135–145)

## 2016-08-01 LAB — HEMOGLOBIN AND HEMATOCRIT, BLOOD
HCT: 26.1 % — ABNORMAL LOW (ref 36.0–46.0)
Hemoglobin: 8.4 g/dL — ABNORMAL LOW (ref 12.0–15.0)

## 2016-08-01 LAB — TYPE AND SCREEN
ABO/RH(D): O POS
Antibody Screen: NEGATIVE

## 2016-08-01 SURGERY — NEPHRECTOMY, RADICAL, LAPAROSCOPIC, ADULT
Anesthesia: General | Laterality: Left

## 2016-08-01 MED ORDER — HYDROCORTISONE NA SUCCINATE PF 100 MG IJ SOLR
100.0000 mg | INTRAMUSCULAR | Status: AC
  Administered 2016-08-01: 100 mg via INTRAVENOUS
  Filled 2016-08-01: qty 2

## 2016-08-01 MED ORDER — ATORVASTATIN CALCIUM 40 MG PO TABS
40.0000 mg | ORAL_TABLET | Freq: Every day | ORAL | Status: DC
  Administered 2016-08-01 – 2016-08-02 (×2): 40 mg via ORAL
  Filled 2016-08-01 (×2): qty 1

## 2016-08-01 MED ORDER — ONDANSETRON HCL 4 MG/2ML IJ SOLN
INTRAMUSCULAR | Status: AC
  Filled 2016-08-01: qty 2

## 2016-08-01 MED ORDER — DIPHENHYDRAMINE HCL 50 MG/ML IJ SOLN: 12.5000 mg | Freq: Four times a day (QID) | INTRAMUSCULAR | Status: DC | PRN

## 2016-08-01 MED ORDER — MIDAZOLAM HCL 5 MG/5ML IJ SOLN
INTRAMUSCULAR | Status: DC | PRN
  Administered 2016-08-01: 2 mg via INTRAVENOUS

## 2016-08-01 MED ORDER — HYDROCHLOROTHIAZIDE 25 MG PO TABS
25.0000 mg | ORAL_TABLET | Freq: Every day | ORAL | Status: DC
  Administered 2016-08-02 – 2016-08-03 (×2): 25 mg via ORAL
  Filled 2016-08-01 (×2): qty 1

## 2016-08-01 MED ORDER — FENTANYL CITRATE (PF) 100 MCG/2ML IJ SOLN
INTRAMUSCULAR | Status: DC | PRN
  Administered 2016-08-01: 100 ug via INTRAVENOUS
  Administered 2016-08-01 (×4): 50 ug via INTRAVENOUS
  Administered 2016-08-01: 100 ug via INTRAVENOUS
  Administered 2016-08-01: 50 ug via INTRAVENOUS

## 2016-08-01 MED ORDER — PROPOFOL 10 MG/ML IV BOLUS
INTRAVENOUS | Status: AC
  Filled 2016-08-01: qty 20

## 2016-08-01 MED ORDER — LACTATED RINGERS IR SOLN
Status: DC | PRN
  Administered 2016-08-01: 200 mL

## 2016-08-01 MED ORDER — ONDANSETRON HCL 4 MG/2ML IJ SOLN
INTRAMUSCULAR | Status: AC
  Filled 2016-08-01: qty 4

## 2016-08-01 MED ORDER — DIPHENHYDRAMINE HCL 12.5 MG/5ML PO ELIX: 12.5000 mg | ORAL_SOLUTION | Freq: Four times a day (QID) | ORAL | Status: DC | PRN

## 2016-08-01 MED ORDER — INSULIN ASPART 100 UNIT/ML ~~LOC~~ SOLN
0.0000 [IU] | Freq: Three times a day (TID) | SUBCUTANEOUS | Status: DC
  Administered 2016-08-02: 3 [IU] via SUBCUTANEOUS
  Administered 2016-08-02: 2 [IU] via SUBCUTANEOUS
  Administered 2016-08-02: 3 [IU] via SUBCUTANEOUS

## 2016-08-01 MED ORDER — BUPIVACAINE LIPOSOME 1.3 % IJ SUSP
20.0000 mL | Freq: Once | INTRAMUSCULAR | Status: AC
  Administered 2016-08-01: 20 mL
  Filled 2016-08-01: qty 20

## 2016-08-01 MED ORDER — ROCURONIUM BROMIDE 100 MG/10ML IV SOLN
INTRAVENOUS | Status: DC | PRN
  Administered 2016-08-01 (×3): 10 mg via INTRAVENOUS
  Administered 2016-08-01: 50 mg via INTRAVENOUS

## 2016-08-01 MED ORDER — HYDROCORTISONE NA SUCCINATE PF 100 MG IJ SOLR
25.0000 mg | Freq: Three times a day (TID) | INTRAMUSCULAR | Status: AC
  Administered 2016-08-01 – 2016-08-02 (×3): 25 mg via INTRAVENOUS
  Filled 2016-08-01 (×3): qty 2

## 2016-08-01 MED ORDER — SUGAMMADEX SODIUM 200 MG/2ML IV SOLN
INTRAVENOUS | Status: AC
  Filled 2016-08-01: qty 2

## 2016-08-01 MED ORDER — HYDROMORPHONE HCL 2 MG/ML IJ SOLN
0.5000 mg | INTRAMUSCULAR | Status: DC | PRN
  Administered 2016-08-01 – 2016-08-02 (×4): 1 mg via INTRAVENOUS
  Filled 2016-08-01 (×4): qty 1

## 2016-08-01 MED ORDER — LIDOCAINE 2% (20 MG/ML) 5 ML SYRINGE
INTRAMUSCULAR | Status: AC
  Filled 2016-08-01: qty 5

## 2016-08-01 MED ORDER — SUGAMMADEX SODIUM 500 MG/5ML IV SOLN
INTRAVENOUS | Status: AC
  Filled 2016-08-01: qty 5

## 2016-08-01 MED ORDER — PHENYLEPHRINE 40 MCG/ML (10ML) SYRINGE FOR IV PUSH (FOR BLOOD PRESSURE SUPPORT)
PREFILLED_SYRINGE | INTRAVENOUS | Status: AC
  Filled 2016-08-01: qty 10

## 2016-08-01 MED ORDER — SUGAMMADEX SODIUM 200 MG/2ML IV SOLN
INTRAVENOUS | Status: DC | PRN
  Administered 2016-08-01: 150 mg via INTRAVENOUS
  Administered 2016-08-01: 50 mg via INTRAVENOUS

## 2016-08-01 MED ORDER — HYDROCORTISONE 20 MG PO TABS
20.0000 mg | ORAL_TABLET | Freq: Every day | ORAL | Status: DC
  Administered 2016-08-03: 20 mg via ORAL
  Filled 2016-08-01: qty 1

## 2016-08-01 MED ORDER — ONDANSETRON HCL 4 MG/2ML IJ SOLN
INTRAMUSCULAR | Status: DC | PRN
  Administered 2016-08-01: 4 mg via INTRAVENOUS

## 2016-08-01 MED ORDER — POTASSIUM CHLORIDE IN NACL 20-0.45 MEQ/L-% IV SOLN
INTRAVENOUS | Status: DC
  Administered 2016-08-01 – 2016-08-02 (×3): via INTRAVENOUS
  Filled 2016-08-01 (×3): qty 1000

## 2016-08-01 MED ORDER — KCL IN DEXTROSE-NACL 20-5-0.45 MEQ/L-%-% IV SOLN
INTRAVENOUS | Status: DC
  Administered 2016-08-01: 18:00:00 via INTRAVENOUS
  Filled 2016-08-01: qty 1000

## 2016-08-01 MED ORDER — HYDROCODONE-ACETAMINOPHEN 5-325 MG PO TABS: 1.0000 | ORAL_TABLET | Freq: Four times a day (QID) | ORAL | 0 refills | Status: DC | PRN

## 2016-08-01 MED ORDER — OXYBUTYNIN CHLORIDE 5 MG PO TABS: 5.0000 mg | ORAL_TABLET | Freq: Three times a day (TID) | ORAL | Status: DC | PRN

## 2016-08-01 MED ORDER — ONDANSETRON HCL 4 MG/2ML IJ SOLN
4.0000 mg | INTRAMUSCULAR | Status: DC | PRN
  Administered 2016-08-01 – 2016-08-02 (×2): 4 mg via INTRAVENOUS
  Filled 2016-08-01 (×2): qty 2

## 2016-08-01 MED ORDER — PROPOFOL 10 MG/ML IV BOLUS
INTRAVENOUS | Status: DC | PRN
  Administered 2016-08-01: 120 mg via INTRAVENOUS
  Administered 2016-08-01: 80 mg via INTRAVENOUS

## 2016-08-01 MED ORDER — SENNA 8.6 MG PO TABS
1.0000 | ORAL_TABLET | Freq: Two times a day (BID) | ORAL | Status: DC
  Administered 2016-08-01 – 2016-08-03 (×4): 8.6 mg via ORAL
  Filled 2016-08-01 (×4): qty 1

## 2016-08-01 MED ORDER — LIDOCAINE HCL (CARDIAC) 20 MG/ML IV SOLN
INTRAVENOUS | Status: DC | PRN
  Administered 2016-08-01: 100 mg via INTRAVENOUS

## 2016-08-01 MED ORDER — BUPIVACAINE HCL (PF) 0.25 % IJ SOLN
INTRAMUSCULAR | Status: DC | PRN
  Administered 2016-08-01: 10 mL

## 2016-08-01 MED ORDER — ROCURONIUM BROMIDE 50 MG/5ML IV SOSY
PREFILLED_SYRINGE | INTRAVENOUS | Status: AC
  Filled 2016-08-01: qty 5

## 2016-08-01 MED ORDER — LACTATED RINGERS IV SOLN
INTRAVENOUS | Status: DC
  Administered 2016-08-01: 12:00:00 via INTRAVENOUS

## 2016-08-01 MED ORDER — CEFAZOLIN IN D5W 1 GM/50ML IV SOLN
1.0000 g | Freq: Three times a day (TID) | INTRAVENOUS | Status: AC
  Administered 2016-08-01 – 2016-08-02 (×2): 1 g via INTRAVENOUS
  Filled 2016-08-01 (×2): qty 50

## 2016-08-01 MED ORDER — SODIUM CHLORIDE 0.9 % IJ SOLN
INTRAMUSCULAR | Status: AC
  Filled 2016-08-01: qty 50

## 2016-08-01 MED ORDER — LEVOTHYROXINE SODIUM 25 MCG PO TABS
25.0000 ug | ORAL_TABLET | Freq: Every day | ORAL | Status: DC
  Administered 2016-08-02 – 2016-08-03 (×2): 25 ug via ORAL
  Filled 2016-08-01 (×2): qty 1

## 2016-08-01 MED ORDER — HYDROMORPHONE HCL 2 MG/ML IJ SOLN
INTRAMUSCULAR | Status: AC
  Filled 2016-08-01: qty 1

## 2016-08-01 MED ORDER — FENTANYL CITRATE (PF) 100 MCG/2ML IJ SOLN
INTRAMUSCULAR | Status: AC
  Filled 2016-08-01: qty 2

## 2016-08-01 MED ORDER — LACTATED RINGERS IV SOLN
INTRAVENOUS | Status: DC
  Administered 2016-08-01 (×4): via INTRAVENOUS

## 2016-08-01 MED ORDER — MIDAZOLAM HCL 2 MG/2ML IJ SOLN
INTRAMUSCULAR | Status: AC
  Filled 2016-08-01: qty 2

## 2016-08-01 MED ORDER — PROMETHAZINE HCL 25 MG/ML IJ SOLN
6.2500 mg | INTRAMUSCULAR | Status: DC | PRN
  Administered 2016-08-01: 6.25 mg via INTRAVENOUS

## 2016-08-01 MED ORDER — FENTANYL CITRATE (PF) 100 MCG/2ML IJ SOLN
INTRAMUSCULAR | Status: AC
Start: 2016-08-01 — End: 2016-08-01
  Filled 2016-08-01: qty 2

## 2016-08-01 MED ORDER — HYDROCORTISONE 10 MG PO TABS
10.0000 mg | ORAL_TABLET | Freq: Every day | ORAL | Status: DC
  Filled 2016-08-01: qty 1

## 2016-08-01 MED ORDER — HYDROMORPHONE HCL 2 MG/ML IJ SOLN
0.2500 mg | INTRAMUSCULAR | Status: DC | PRN
  Administered 2016-08-01: 0.5 mg via INTRAVENOUS
  Administered 2016-08-01: 0.3 mg via INTRAVENOUS

## 2016-08-01 MED ORDER — OXYCODONE HCL 5 MG PO TABS
5.0000 mg | ORAL_TABLET | ORAL | Status: DC | PRN
  Administered 2016-08-02 – 2016-08-03 (×7): 5 mg via ORAL
  Filled 2016-08-01 (×7): qty 1

## 2016-08-01 MED ORDER — INSULIN ASPART 100 UNIT/ML ~~LOC~~ SOLN: 0.0000 [IU] | Freq: Every day | SUBCUTANEOUS | Status: DC

## 2016-08-01 MED ORDER — BUPIVACAINE HCL (PF) 0.25 % IJ SOLN
INTRAMUSCULAR | Status: AC
  Filled 2016-08-01: qty 30

## 2016-08-01 MED ORDER — CEFAZOLIN SODIUM-DEXTROSE 2-4 GM/100ML-% IV SOLN
2.0000 g | Freq: Once | INTRAVENOUS | Status: AC
  Administered 2016-08-01: 2 g via INTRAVENOUS
  Filled 2016-08-01: qty 100

## 2016-08-01 MED ORDER — PHENYLEPHRINE HCL 10 MG/ML IJ SOLN
INTRAMUSCULAR | Status: DC | PRN
  Administered 2016-08-01 (×3): 80 ug via INTRAVENOUS

## 2016-08-01 MED ORDER — DOCUSATE SODIUM 100 MG PO CAPS
100.0000 mg | ORAL_CAPSULE | Freq: Two times a day (BID) | ORAL | Status: DC
  Administered 2016-08-01 – 2016-08-03 (×4): 100 mg via ORAL
  Filled 2016-08-01 (×4): qty 1

## 2016-08-01 MED ORDER — ACETAMINOPHEN 500 MG PO TABS
1000.0000 mg | ORAL_TABLET | Freq: Four times a day (QID) | ORAL | Status: AC
  Administered 2016-08-01 – 2016-08-02 (×4): 1000 mg via ORAL
  Filled 2016-08-01 (×4): qty 2

## 2016-08-01 MED ORDER — INSULIN ASPART 100 UNIT/ML ~~LOC~~ SOLN: 0.0000 [IU] | Freq: Three times a day (TID) | SUBCUTANEOUS | Status: DC

## 2016-08-01 MED ORDER — PROMETHAZINE HCL 25 MG/ML IJ SOLN
INTRAMUSCULAR | Status: AC
  Filled 2016-08-01: qty 1

## 2016-08-01 MED ORDER — FENTANYL CITRATE (PF) 250 MCG/5ML IJ SOLN
INTRAMUSCULAR | Status: AC
  Filled 2016-08-01: qty 5

## 2016-08-01 MED ORDER — SODIUM CHLORIDE 0.9 % IJ SOLN
INTRAMUSCULAR | Status: DC | PRN
  Administered 2016-08-01: 20 mL

## 2016-08-01 MED ORDER — POLYVINYL ALCOHOL 1.4 % OP SOLN
1.0000 [drp] | OPHTHALMIC | Status: DC | PRN
  Filled 2016-08-01: qty 15

## 2016-08-01 SURGICAL SUPPLY — 55 items
APPLICATOR SURGIFLO ENDO (HEMOSTASIS) ×2 IMPLANT
BAG LAPAROSCOPIC 12 15 PORT 16 (BASKET) ×1 IMPLANT
BAG RETRIEVAL 12/15 (BASKET) ×2
BAG ZIPLOCK 12X15 (MISCELLANEOUS) ×2 IMPLANT
BLADE EXTENDED COATED 6.5IN (ELECTRODE) IMPLANT
BLADE SURG SZ10 CARB STEEL (BLADE) IMPLANT
CATH FOLEY 3WAY  5CC 18FR (CATHETERS)
CATH FOLEY 3WAY 5CC 18FR (CATHETERS) IMPLANT
CHLORAPREP W/TINT 26ML (MISCELLANEOUS) ×2 IMPLANT
CLIP LIGATING HEM O LOK PURPLE (MISCELLANEOUS) ×4 IMPLANT
CLIP LIGATING HEMO LOK XL GOLD (MISCELLANEOUS) ×2 IMPLANT
CLIP LIGATING HEMO O LOK GREEN (MISCELLANEOUS) ×4 IMPLANT
COVER SURGICAL LIGHT HANDLE (MISCELLANEOUS) ×2 IMPLANT
CUTTER FLEX LINEAR 45M (STAPLE) ×2 IMPLANT
DERMABOND ADVANCED (GAUZE/BANDAGES/DRESSINGS) ×1
DERMABOND ADVANCED .7 DNX12 (GAUZE/BANDAGES/DRESSINGS) ×1 IMPLANT
DRAIN CHANNEL 10F 3/8 F FF (DRAIN) IMPLANT
DRAPE INCISE IOBAN 66X45 STRL (DRAPES) ×2 IMPLANT
DRAPE WARM FLUID 44X44 (DRAPE) IMPLANT
DRSG TEGADERM 4X4.75 (GAUZE/BANDAGES/DRESSINGS) IMPLANT
ELECT PENCIL ROCKER SW 15FT (MISCELLANEOUS) ×2 IMPLANT
ELECT REM PT RETURN 15FT ADLT (MISCELLANEOUS) ×2 IMPLANT
EVACUATOR SILICONE 100CC (DRAIN) IMPLANT
GLOVE BIO SURGEON STRL SZ 6.5 (GLOVE) ×2 IMPLANT
GLOVE BIOGEL M STRL SZ7.5 (GLOVE) ×4 IMPLANT
GOWN STRL REUS W/TWL LRG LVL3 (GOWN DISPOSABLE) ×6 IMPLANT
HEMOSTAT SURGICEL 4X8 (HEMOSTASIS) IMPLANT
IRRIG SUCT STRYKERFLOW 2 WTIP (MISCELLANEOUS) ×2
IRRIGATION SUCT STRKRFLW 2 WTP (MISCELLANEOUS) ×1 IMPLANT
KIT BASIN OR (CUSTOM PROCEDURE TRAY) ×2 IMPLANT
NS IRRIG 1000ML POUR BTL (IV SOLUTION) ×2 IMPLANT
POUCH SPECIMEN RETRIEVAL 10MM (ENDOMECHANICALS) IMPLANT
RELOAD 45 VASCULAR/THIN (ENDOMECHANICALS) ×4 IMPLANT
RETRACTOR LAPSCP 12X46 CVD (ENDOMECHANICALS) IMPLANT
RTRCTR LAPSCP 12X46 CVD (ENDOMECHANICALS)
SCISSORS LAP 5X35 DISP (ENDOMECHANICALS) IMPLANT
SET IRRIG TUBING LAPAROSCOPIC (IRRIGATION / IRRIGATOR) ×2 IMPLANT
SHEARS HARMONIC ACE PLUS 36CM (ENDOMECHANICALS) ×2 IMPLANT
SURGIFLO W/THROMBIN 8M KIT (HEMOSTASIS) ×2 IMPLANT
SUT CHROMIC 2 0 SH (SUTURE) IMPLANT
SUT ETHILON 3 0 PS 1 (SUTURE) IMPLANT
SUT MNCRL AB 4-0 PS2 18 (SUTURE) ×4 IMPLANT
SUT PDS AB 1 CTX 36 (SUTURE) ×4 IMPLANT
SUT VIC AB 2-0 SH 27 (SUTURE)
SUT VIC AB 2-0 SH 27X BRD (SUTURE) IMPLANT
SUT VICRYL 0 UR6 27IN ABS (SUTURE) ×4 IMPLANT
TOWEL OR 17X26 10 PK STRL BLUE (TOWEL DISPOSABLE) ×2 IMPLANT
TRAY FOLEY CATH 16FRSI W/METER (SET/KITS/TRAYS/PACK) ×2 IMPLANT
TRAY LAPAROSCOPIC (CUSTOM PROCEDURE TRAY) ×2 IMPLANT
TROCAR BLADELESS OPT 5 100 (ENDOMECHANICALS) ×2 IMPLANT
TROCAR XCEL 12X100 BLDLESS (ENDOMECHANICALS) ×2 IMPLANT
TROCAR XCEL BLUNT TIP 100MML (ENDOMECHANICALS) ×2 IMPLANT
TUBING INSUF HEATED (TUBING) IMPLANT
URINEMETER 200ML W/220 (MISCELLANEOUS) IMPLANT
YANKAUER SUCT BULB TIP 10FT TU (MISCELLANEOUS) ×2 IMPLANT

## 2016-08-01 NOTE — Progress Notes (Signed)
Updated MD concerning CBGs and dry eyes, orders received to adjust IVF and insulin as noted. Spoke with pharmacy to review insulin and IVF orders. SRP, RN

## 2016-08-01 NOTE — Transfer of Care (Signed)
Immediate Anesthesia Transfer of Care Note  Patient: Audrey [name]  Procedure(s) Performed: Procedure(s): LAPAROSCOPIC  RADICAL NEPHRECTOMY/ REPAIR OF UMBILICAL HERNIA (Left)  Patient Location: PACU  Anesthesia Type:General  Level of Consciousness: sedated  Airway & Oxygen Therapy: Patient Spontanous Breathing and Patient connected to face mask oxygen  Post-op Assessment: Report given to RN and Post -op Vital signs reviewed and stable  Post vital signs: Reviewed and stable  Last Vitals:  Vitals:   08/01/16 0946  BP: (!) 145/88  Pulse: 90  Resp: 18  Temp: 36.6 C    Last Pain:  Vitals:   08/01/16 0946  TempSrc: Oral         Complications: No apparent anesthesia complications

## 2016-08-01 NOTE — Interval H&P Note (Signed)
History and Physical Interval Note:  08/01/2016 10:50 AM  Audrey [name]  has presented today for surgery, with the diagnosis of LEFT RENAL CELL CARCINOMA  The various methods of treatment have been discussed with the patient and family. After consideration of risks, benefits and other options for treatment, the patient has consented to  Procedure(s): LAPAROSCOPIC  RADICAL NEPHRECTOMY/ REPAIR OF UMBILICAL HERNIA (Left) as a surgical intervention . Pt on steroids now.  Will administer stress dose steroids.  The patient's history has been reviewed, patient examined, no change in status, stable for surgery.  I have reviewed the patient's chart and labs.  Questions were answered to the patient's satisfaction.     Lita Flynn,LES

## 2016-08-01 NOTE — Anesthesia Preprocedure Evaluation (Addendum)
Anesthesia Evaluation  Patient identified by MRN, date of birth, ID band Patient awake    Reviewed: Allergy & Precautions, NPO status , Patient's Chart, lab work & pertinent test results  Airway Mallampati: III  TM Distance: >3 FB Neck ROM: Full   Comment: Receding mandible with protruding teet Dental no notable dental hx.    Pulmonary former smoker,    Pulmonary exam normal breath sounds clear to auscultation       Cardiovascular hypertension, Pt. on medications Normal cardiovascular exam Rhythm:Regular Rate:Normal     Neuro/Psych negative neurological ROS  negative psych ROS   GI/Hepatic negative GI ROS, Neg liver ROS,   Endo/Other  diabetes, Well Controlled, Type 2, Oral Hypoglycemic AgentsHyperlipidemia  Renal/GU Left renal cell carcinoma  negative genitourinary   Musculoskeletal Umbilical hernia   Abdominal   Peds  Hematology  (+) anemia ,   Anesthesia Other Findings   Reproductive/Obstetrics negative OB ROS                            Anesthesia Physical Anesthesia Plan  ASA: III  Anesthesia Plan: General   Post-op Pain Management:    Induction: Intravenous  Airway Management Planned: Oral ETT  Additional Equipment: Arterial line  Intra-op Plan:   Post-operative Plan: Extubation in OR  Informed Consent: I have reviewed the patients History and Physical, chart, labs and discussed the procedure including the risks, benefits and alternatives for the proposed anesthesia with the patient or authorized representative who has indicated his/her understanding and acceptance.   Dental advisory given  Plan Discussed with: CRNA, Anesthesiologist and Surgeon  Anesthesia Plan Comments:         Anesthesia Quick Evaluation

## 2016-08-01 NOTE — Discharge Instructions (Signed)

## 2016-08-01 NOTE — Op Note (Signed)
Preoperative diagnosis: Left renal neoplasm, umbilical hernia  Postoperative diagnosis: Left renal neoplasm, umbilical hernia  Procedure: 1.  Cytoreductive left laparoscopic radical nephrectomy and left adrenalectomy 2. Umbilical hernia repair    Surgeon: Pryor Curia. M.D.  Assistant(s): Debbrah Alar, PA-C  An assistant was required for this surgical procedure.  The duties of the assistant included but were not limited to suctioning, passing suture, camera manipulation, retraction. This procedure would not be able to be performed without an Environmental consultant.  Resident: Dr. Cleotis Lema  Anesthesia: General  Complications: None  EBL: 100 mL  IVF:  2400 mL crystalloid  Specimens: 1. Left kidney  Disposition of specimens: Pathology  Indication: SHAWNELLE SPOERL is a 76 y.o. patient with a left renal tumor and biopsy proven metastatic renal cell carcinoma and an umbilical hernia.  After a thorough review of the management options for their renal mass, they elected to proceed with surgical treatment and the above procedure.  We have discussed the potential benefits and risks of the procedure, side effects of the proposed treatment, the likelihood of the patient achieving the goals of the procedure, and any potential problems that might occur during the procedure or recuperation. Informed consent has been obtained.  Description of procedure:  The patient was taken to the operating room and a general anesthetic was administered. The patient was given preoperative antibiotics, placed in the left modified flank position, and prepped and draped in the usual sterile fashion. Next a preoperative timeout was performed.  A site was selected near the umbilicus for placement of the camera port. This was placed using a standard open Hassan technique which allowed entry into the peritoneal cavity under direct vision and without difficulty. A 12 mm Hassan cannula was placed and a pneumoperitoneum  established. The camera was then used to inspect the abdomen and there was no evidence of any intra-abdominal injuries or other abnormalities. The remaining abdominal ports were then placed. A 12 mm port was placed in the left lower quadrant and a 5 mm port was placed in the left upper quadrant.  All ports were placed under direct vision without difficulty.  Utilizing the harmonic scalpel, the white line of Toldt was incised allowing the colon to be reflected medially and the plane between the mesocolon and the anterior layer of Gerota's fascia to be developed and the kidney exposed.  The ureter and gonadal vein were identified inferiorly and were lifted anteriorly off the psoas muscle.  Dissection proceeded superiorly along the gonadal vein until the renal vein was identified.  The renal hilum was then carefully isolated with a combination of blunt and sharp dissectiong allowing the renal arterial and venous structures to be separated and isolated.   There was a lumbar vein off the gonadal vein that was first isolated and divided after ligation with 10 mm Weck clips. The renal artery was isolated and ligated with multiple Weck clips and subsequently divided.  The renal vein was then isolated and also ligated and divided with a 45 mm Flex ETS stapler.  Dissection continued superiorly outside Gerota's fascia to perform an adrenalectomy as the patient did have a left adrenal mass suspicious for malignancy.  The adrenal vein was isolated and divided between 10 mm Weck clips.  The splenorenal ligaments were divided with the harmonic scalpel.  The lateral and posterior attachements to the kidney were then divided.  The ureter was ligated with Weck clips and divided allowing the specimen to be freed from all surrounding structures.  The kidney specimen was then placed into a 12 mm retrieval bag.  The renal hilum, liver, adrenal bed and gonadal vein areas were each inspected and hemostasis was ensured with the  pneomperitoneal pressures lowered.  The 12 mm lower quadrant port was then closed with a 0-vicryl suture placed laparoscopically to close the fascia of this incision. All remaining ports were removed under direct vision.  The kidney specimen was removed intact within the retrieval bag via the camera port site after this incision was extended slightly.  Care was taken to incorporate the umbilical hernia defect, which measured about 1.5 cm. This fascial opening was then closed with a #1 PDS sutures taking care to incorporate the hernia and repair the defect during closure.  All incisions were injected with local anesthetic and reapproximated at the skin with 4-0 monocryl sutures.  Dermabond was applied to the skin. The patient tolerated the procedure well and without complications and was transferred to the recovery unit in satisfactory condition.   Pryor Curia MD

## 2016-08-01 NOTE — Discharge Summary (Signed)
Physician Discharge Summary  Patient ID: Audrey [name] MRN: [id number] DOB/AGE: 75-Nov-1942 75 y.o.  Admit date: 08/01/2016 Discharge date: 08/03/2016  Admission Diagnoses: Left renal mass, likely metastatic RCC  Discharge Diagnoses: Same  Secondary diagnoses: Diabetes  Discharged Condition: good  Hospital Course:  Audrey [name] is a 76 y.o. female who is s/p left laparoscopic radical nephrectomy on 08/01/16 with Dr. Alinda Money.  The patient tolerated the procedure well, was extubated in the OR and taken to the recovery unit for routine postoperative care. They were then transferred to the floor. By POD 2 she had met the usual goals for discharge including ambulating at a preoperative capacity, having pain controlled with PO PRN medications, and tolerating a regular diet. Foley was removed on POD1 and she passed her TOV.  The patient will follow up with [facility] on 08/20/16. Pathology pending at time of discharge.    Disposition: Home  Allergies as of 08/03/2016      Reactions   Adhesive [tape] Other (See Comments)   Tears skin off   Nsaids Other (See Comments)   Liver damage      Medication List    STOP taking these medications   Vitamin D3 5000 units Tabs     TAKE these medications   acetaminophen 500 MG tablet Commonly known as:  TYLENOL Take 1,000 mg by mouth 2 (two) times daily.   atorvastatin 40 MG tablet Commonly known as:  LIPITOR Take 40 mg by mouth daily at 6 PM.   clobetasol ointment 0.05 % Commonly known as:  TEMOVATE Apply 1 application topically 2 (two) times daily.   glimepiride 1 MG tablet Commonly known as:  AMARYL Take 1 mg by mouth every evening.   hydrochlorothiazide 25 MG tablet Commonly known as:  HYDRODIURIL Take 25 mg by mouth daily.   hydrocortisone 10 MG tablet Commonly known as:  CORTEF 65m po with breakfast and 162mpo with lunch   levothyroxine 25 MCG tablet Commonly known as:  SYNTHROID, LEVOTHROID Take 25 mcg by mouth daily  before breakfast.   lisinopril 40 MG tablet Commonly known as:  PRINIVIL,ZESTRIL Take 40 mg by mouth every evening.   traMADol 50 MG tablet Commonly known as:  ULTRAM Take 1-2 tablets (50-100 mg total) by mouth every 6 ([name]) hours as needed for moderate pain.      Follow-up Information    Natalina Wieting,LES, MD Follow up on 08/20/2016.   Specialty:  Urology Why:  at 10:45 Contact information: [id number] [phone]         Signed: BODutch Gray/14/2018, 10:19 AM

## 2016-08-01 NOTE — Progress Notes (Signed)
Patient ID: Audrey [name], female   DOB: Oct 23, 1940, 76 y.o.   MRN: [id number]   Post-op note  Subjective: The patient is doing well.  No complaints.  Objective: Vital signs in last 24 hours: Temp:  [97.3 F (36.3 C)-98 F (36.7 C)] 98 F (36.7 C) (04/12 1624) Pulse Rate:  [78-90] 82 (04/12 1630) Resp:  [12-18] 13 (04/12 1630) BP: (133-163)/(57-88) 133/57 (04/12 1630) SpO2:  [100 %] 100 % (04/12 1630) Arterial Line BP: (164-166)/(56-59) 164/56 (04/12 1600) Weight:  [58.5 kg (129 lb)] 58.5 kg (129 lb) (04/12 0937)  Intake/Output from previous day: No intake/output data recorded. Intake/Output this shift: Total I/O In: 2700 [I.V.:2700] Out: 650 [Urine:600; Blood:50]  Physical Exam:  General: Alert and oriented. Abdomen: Soft, Nondistended. Incisions: Clean and dry.  Lab Results:  Recent Labs  08/01/16 1551  HGB 8.4*  HCT 26.1*    Assessment/Plan: POD#0   1) Continue to monitor, ambulate later this evening 2) Steroid taper   Pryor Curia. MD   LOS: 0 days   Burnadette Baskett,LES 08/01/2016, 4:47 PM

## 2016-08-01 NOTE — Anesthesia Procedure Notes (Signed)
Procedure Name: Intubation Date/Time: 08/01/2016 11:50 AM Performed by: Claudia Desanctis Pre-anesthesia Checklist: Patient identified, Emergency Drugs available, Suction available and Patient being monitored Patient Re-evaluated:Patient Re-evaluated prior to inductionOxygen Delivery Method: Circle system utilized Preoxygenation: Pre-oxygenation with 100% oxygen Intubation Type: IV induction Ventilation: Mask ventilation without difficulty Laryngoscope Size: Glidescope and 3 Grade View: Grade I Tube type: Parker flex tip Number of attempts: 1 Airway Equipment and Method: Video-laryngoscopy Placement Confirmation: ETT inserted through vocal cords under direct vision,  breath sounds checked- equal and bilateral and positive ETCO2 Secured at: 20 cm Tube secured with: Tape Dental Injury: Teeth and Oropharynx as per pre-operative assessment

## 2016-08-01 NOTE — Anesthesia Postprocedure Evaluation (Signed)
Anesthesia Post Note  Patient: Audrey [name]  Procedure(s) Performed: Procedure(s) (LRB): LAPAROSCOPIC  RADICAL NEPHRECTOMY/ REPAIR OF UMBILICAL HERNIA (Left)  Patient location during evaluation: PACU Anesthesia Type: General Level of consciousness: awake and sedated Pain management: pain level controlled Vital Signs Assessment: post-procedure vital signs reviewed and stable Respiratory status: spontaneous breathing Cardiovascular status: stable Postop Assessment: no signs of nausea or vomiting Anesthetic complications: no        Last Vitals:  Vitals:   08/01/16 1545 08/01/16 1600  BP: (!) 153/59 (!) 142/58  Pulse: 78 82  Resp: 16 15  Temp:      Last Pain:  Vitals:   08/01/16 1600  TempSrc:   PainSc: Asleep   Pain Goal:                 [name] JR,JOHN Dynesha Woolen

## 2016-08-02 LAB — BASIC METABOLIC PANEL
Anion gap: 10 (ref 5–15)
BUN: 21 mg/dL — ABNORMAL HIGH (ref 6–20)
CO2: 24 mmol/L (ref 22–32)
Calcium: 8.7 mg/dL — ABNORMAL LOW (ref 8.9–10.3)
Chloride: 93 mmol/L — ABNORMAL LOW (ref 101–111)
Creatinine, Ser: 1.22 mg/dL — ABNORMAL HIGH (ref 0.44–1.00)
GFR calc Af Amer: 49 mL/min — ABNORMAL LOW (ref >60)
GFR calc non Af Amer: 43 mL/min — ABNORMAL LOW (ref >60)
Glucose, Bld: 186 mg/dL — ABNORMAL HIGH (ref 65–99)
Potassium: 4.3 mmol/L (ref 3.5–5.1)
Sodium: 127 mmol/L — ABNORMAL LOW (ref 135–145)

## 2016-08-02 LAB — HEMOGLOBIN AND HEMATOCRIT, BLOOD
HCT: 26.2 % — ABNORMAL LOW (ref 36.0–46.0)
Hemoglobin: 8.2 g/dL — ABNORMAL LOW (ref 12.0–15.0)

## 2016-08-02 LAB — GLUCOSE, CAPILLARY
Glucose-Capillary: 143 mg/dL — ABNORMAL HIGH (ref 65–99)
Glucose-Capillary: 152 mg/dL — ABNORMAL HIGH (ref 65–99)
Glucose-Capillary: 157 mg/dL — ABNORMAL HIGH (ref 65–99)
Glucose-Capillary: 160 mg/dL — ABNORMAL HIGH (ref 65–99)

## 2016-08-02 MED ORDER — BISACODYL 10 MG RE SUPP
10.0000 mg | Freq: Once | RECTAL | Status: AC
  Administered 2016-08-02: 10 mg via RECTAL
  Filled 2016-08-02: qty 1

## 2016-08-02 MED ORDER — TRAMADOL HCL 50 MG PO TABS
50.0000 mg | ORAL_TABLET | Freq: Four times a day (QID) | ORAL | Status: DC | PRN
  Administered 2016-08-03: 100 mg via ORAL
  Filled 2016-08-02: qty 2

## 2016-08-02 NOTE — Progress Notes (Signed)
UROLOGY PROGRESS NOTES  Assessment/Plan: Audrey [name] is a 76 y.o. female with a history of DM, HTN, HLD and metastatic RCC s/p left cytoreductive lap nephrectomy on 08/01/16 with Dr. Alinda Money.   Interval/Plan: AFVSS. 800cc UOP. Cr 1.22. Hb stable at 8.2.   - Wean oxygen - Ambulate/IS/OOB - Continue IVF for now - Advance diet as tolerated to DM diet - Discontinue Foley, TOV today - Hydrocort IV 25 TID today, transition back to preop hydrocort 20 PO qAM and 10PO at lunch tomorrow - SSI   Subjective: Pain 5/10, near belly button. Some nausea with 2 spit up episodes. No flatus yet. Ambulating already.   Objective:  Vital signs in last 24 hours: Temp:  [97.3 F (36.3 C)-98.1 F (36.7 C)] 98 F (36.7 C) (04/13 0640) Pulse Rate:  [69-90] 69 (04/13 0640) Resp:  [12-18] 18 (04/13 0640) BP: (108-163)/(55-88) 125/77 (04/13 0640) SpO2:  [100 %] 100 % (04/13 0640) Arterial Line BP: (164-166)/(56-59) 164/56 (04/12 1600) Weight:  [58.5 kg (129 lb)] 58.5 kg (129 lb) (04/12 5003)  04/12 0701 - 04/13 0700 In: 3910 [I.V.:3810; IV Piggyback:100] Out: 850 [Urine:800; Blood:50]    Physical Exam:  General:  well-developed and well-nourished elderly female in NAD, lying in bed, alert & oriented, pleasant HEENT: [place]/AT, EOMI, sclera anicteric, hearing grossly intact, no nasal discharge, MMM Respiratory: nonlabored respirations, satting well on [place], symmetrical chest rise Cardiovascular: pulse regular rate & rhythm Abdominal: soft, appropriately TTP, nondistended, surgical incisions c/d/i without signs of exudate/erythema GU: Foley draining clear yellow urine Extremities: warm, well-perfused, no c/c/e   Data Review: Results for orders placed or performed during the hospital encounter of 08/01/16 (from the past 24 hour(s))  Type and screen [place]     Status: None   Collection Time: 08/01/16  9:42 AM  Result Value Ref Range   ABO/RH(D) O POS    Antibody Screen NEG    Sample Expiration 70/48/8891   Basic metabolic panel     Status: Abnormal   Collection Time: 08/01/16  3:51 PM  Result Value Ref Range   Sodium 130 (L) 135 - 145 mmol/L   Potassium 3.7 3.5 - 5.1 mmol/L   Chloride 96 (L) 101 - 111 mmol/L   CO2 23 22 - 32 mmol/L   Glucose, Bld 211 (H) 65 - 99 mg/dL   BUN 22 (H) 6 - 20 mg/dL   Creatinine, Ser 1.05 (H) 0.44 - 1.00 mg/dL   Calcium 8.8 (L) 8.9 - 10.3 mg/dL   GFR calc non Af Amer 51 (L) >60 mL/min   GFR calc Af Amer 59 (L) >60 mL/min   Anion gap 11 5 - 15  Hemoglobin and hematocrit, blood     Status: Abnormal   Collection Time: 08/01/16  3:51 PM  Result Value Ref Range   Hemoglobin 8.4 (L) 12.0 - 15.0 g/dL   HCT 26.1 (L) 36.0 - 46.0 %  Glucose, capillary     Status: Abnormal   Collection Time: 08/01/16  3:54 PM  Result Value Ref Range   Glucose-Capillary 211 (H) 65 - 99 mg/dL  Glucose, capillary     Status: Abnormal   Collection Time: 08/01/16  5:05 PM  Result Value Ref Range   Glucose-Capillary 178 (H) 65 - 99 mg/dL  Glucose, capillary     Status: Abnormal   Collection Time: 08/01/16  9:50 PM  Result Value Ref Range   Glucose-Capillary 186 (H) 65 - 99 mg/dL  Basic metabolic panel  Status: Abnormal   Collection Time: 08/02/16  5:23 AM  Result Value Ref Range   Sodium 127 (L) 135 - 145 mmol/L   Potassium 4.3 3.5 - 5.1 mmol/L   Chloride 93 (L) 101 - 111 mmol/L   CO2 24 22 - 32 mmol/L   Glucose, Bld 186 (H) 65 - 99 mg/dL   BUN 21 (H) 6 - 20 mg/dL   Creatinine, Ser 1.22 (H) 0.44 - 1.00 mg/dL   Calcium 8.7 (L) 8.9 - 10.3 mg/dL   GFR calc non Af Amer 43 (L) >60 mL/min   GFR calc Af Amer 49 (L) >60 mL/min   Anion gap 10 5 - 15  Hemoglobin and hematocrit, blood     Status: Abnormal   Collection Time: 08/02/16  5:23 AM  Result Value Ref Range   Hemoglobin 8.2 (L) 12.0 - 15.0 g/dL   HCT 26.2 (L) 36.0 - 46.0 %  Glucose, capillary     Status: Abnormal   Collection Time: 08/02/16  8:02 AM  Result Value Ref Range    Glucose-Capillary 143 (H) 65 - 99 mg/dL    Imaging: None

## 2016-08-02 NOTE — Progress Notes (Signed)
Patient ID: Audrey [name], female   DOB: [birth year], 76 y.o.   MRN: [id number]  1 Day Post-Op Subjective: Pt doing well.  Ambulated with assistance last night.  Some nausea with pain medication.  Objective: Vital signs in last 24 hours: Temp:  [97.3 F (36.3 C)-98.1 F (36.7 C)] 98 F (36.7 C) (04/13 0640) Pulse Rate:  [69-90] 69 (04/13 0640) Resp:  [12-18] 18 (04/13 0640) BP: (108-163)/(55-88) 125/77 (04/13 0640) SpO2:  [100 %] 100 % (04/13 0640) Arterial Line BP: (164-166)/(56-59) 164/56 (04/12 1600) Weight:  [58.5 kg (129 lb)] 58.5 kg (129 lb) (04/12 0937)  Intake/Output from previous day: 04/12 0701 - 04/13 0700 In: 3510 [I.V.:3410; IV Piggyback:100] Out: 850 [Urine:800; Blood:50] Intake/Output this shift: No intake/output data recorded.  Physical Exam:  General: Alert and oriented CV: RRR Lungs: Clear Abdomen: Soft, ND Incisions: C/D/I Ext: NT, No erythema  Lab Results:  Recent Labs  08/01/16 1551 08/02/16 0523  HGB 8.4* 8.2*  HCT 26.1* 26.2*   BMET  Recent Labs  08/01/16 1551 08/02/16 0523  NA 130* 127*  K 3.7 4.3  CL 96* 93*  CO2 23 24  GLUCOSE 211* 186*  BUN 22* 21*  CREATININE 1.05* 1.22*  CALCIUM 8.8* 8.7*     Studies/Results: No results found.  Assessment/Plan: POD # 1 s/p left adrenalectomy and radical nephrectomy - Ambulate, IS - D/C catheter - Advance to diabetic diet - Steroid taper and will transition back to po steroids today   LOS: 1 day   Angelice Piech,LES 08/02/2016, 7:14 AM

## 2016-08-02 NOTE — Plan of Care (Signed)
Problem: Nutrition: Goal: Adequate nutrition will be maintained Outcome: Progressing Pt advanced to carb mod diet today. Is eating well with no nausea.

## 2016-08-03 LAB — HEMOGLOBIN AND HEMATOCRIT, BLOOD
HCT: 27 % — ABNORMAL LOW (ref 36.0–46.0)
Hemoglobin: 8.5 g/dL — ABNORMAL LOW (ref 12.0–15.0)

## 2016-08-03 LAB — BASIC METABOLIC PANEL
Anion gap: 9 (ref 5–15)
BUN: 21 mg/dL — ABNORMAL HIGH (ref 6–20)
CO2: 25 mmol/L (ref 22–32)
Calcium: 8.9 mg/dL (ref 8.9–10.3)
Chloride: 95 mmol/L — ABNORMAL LOW (ref 101–111)
Creatinine, Ser: 1.38 mg/dL — ABNORMAL HIGH (ref 0.44–1.00)
GFR calc Af Amer: 42 mL/min — ABNORMAL LOW (ref >60)
GFR calc non Af Amer: 37 mL/min — ABNORMAL LOW (ref >60)
Glucose, Bld: 109 mg/dL — ABNORMAL HIGH (ref 65–99)
Potassium: 4.7 mmol/L (ref 3.5–5.1)
Sodium: 129 mmol/L — ABNORMAL LOW (ref 135–145)

## 2016-08-03 LAB — POCT I-STAT 4, (NA,K, GLUC, HGB,HCT)
Glucose, Bld: 199 mg/dL — ABNORMAL HIGH (ref 65–99)
HCT: 23 % — ABNORMAL LOW (ref 36.0–46.0)
Hemoglobin: 7.8 g/dL — ABNORMAL LOW (ref 12.0–15.0)
Potassium: 3.8 mmol/L (ref 3.5–5.1)
Sodium: 130 mmol/L — ABNORMAL LOW (ref 135–145)

## 2016-08-03 LAB — GLUCOSE, CAPILLARY: Glucose-Capillary: 104 mg/dL — ABNORMAL HIGH (ref 65–99)

## 2016-08-03 MED ORDER — TRAMADOL HCL 50 MG PO TABS: 50.0000 mg | ORAL_TABLET | Freq: Four times a day (QID) | ORAL | 0 refills | Status: DC | PRN

## 2016-08-03 NOTE — Progress Notes (Signed)
Patient ID: Audrey [name], female   DOB: December 16, 1940, 76 y.o.   MRN: [id number]  2 Days Post-Op Subjective: Ambulating and tolerating regular diet.  Still with postoperative pain but controlled with tramadol. Positive flatus last evening.  Objective: Vital signs in last 24 hours: Temp:  [97.4 F (36.3 C)-98.2 F (36.8 C)] 98.1 F (36.7 C) (04/14 0730) Pulse Rate:  [69-76] 76 (04/14 0730) Resp:  [18-20] 18 (04/14 0730) BP: (149-157)/(62-68) 155/62 (04/14 0730) SpO2:  [96 %-99 %] 98 % (04/14 0730)  Intake/Output from previous day: 04/13 0701 - 04/14 0700 In: 1920 [P.O.:600; I.V.:1320] Out: 1450 [Urine:1450] Intake/Output this shift: Total I/O In: -  Out: 100 [Urine:100]  Physical Exam:  General: Alert and oriented CV: RRR Lungs: Clear Abdomen: Soft, ND, positive BS Incisions: C/D/I Ext: NT, No erythema  Lab Results:  Recent Labs  08/01/16 1551 08/02/16 0523 08/03/16 0529  HGB 8.4* 8.2* 8.5*  HCT 26.1* 26.2* 27.0*   BMET  Recent Labs  08/02/16 0523 08/03/16 0529  NA 127* 129*  K 4.3 4.7  CL 93* 95*  CO2 24 25  GLUCOSE 186* 109*  BUN 21* 21*  CREATININE 1.22* 1.38*  CALCIUM 8.7* 8.9     Studies/Results: No results found.  Assessment/Plan: POD # 2 s/p left radical nephrectomy and adrenalectomy - D/C home   LOS: 2 days   Romanita Fager,LES 08/03/2016, 10:16 AM

## 2016-08-03 NOTE — Progress Notes (Signed)
Pt discharged to home with Husband, instructions reviewed acknowledged understanding.

## 2016-08-16 ENCOUNTER — Ambulatory Visit (HOSPITAL_BASED_OUTPATIENT_CLINIC_OR_DEPARTMENT_OTHER): Payer: Medicare Other | Admitting: Hematology

## 2016-08-16 ENCOUNTER — Encounter: Payer: Self-pay | Admitting: Hematology

## 2016-08-16 ENCOUNTER — Other Ambulatory Visit (HOSPITAL_BASED_OUTPATIENT_CLINIC_OR_DEPARTMENT_OTHER): Payer: Medicare Other

## 2016-08-16 VITALS — BP 177/66 | HR 74 | Temp 97.8°F | Resp 18 | Ht 59.0 in | Wt 126.0 lb

## 2016-08-16 DIAGNOSIS — D649 Anemia, unspecified: Secondary | ICD-10-CM

## 2016-08-16 DIAGNOSIS — C642 Malignant neoplasm of left kidney, except renal pelvis: Secondary | ICD-10-CM | POA: Diagnosis present

## 2016-08-16 DIAGNOSIS — R911 Solitary pulmonary nodule: Secondary | ICD-10-CM | POA: Diagnosis not present

## 2016-08-16 DIAGNOSIS — C7972 Secondary malignant neoplasm of left adrenal gland: Secondary | ICD-10-CM | POA: Diagnosis not present

## 2016-08-16 DIAGNOSIS — E440 Moderate protein-calorie malnutrition: Secondary | ICD-10-CM

## 2016-08-16 DIAGNOSIS — E538 Deficiency of other specified B group vitamins: Secondary | ICD-10-CM

## 2016-08-16 DIAGNOSIS — E2740 Unspecified adrenocortical insufficiency: Secondary | ICD-10-CM

## 2016-08-16 DIAGNOSIS — C7971 Secondary malignant neoplasm of right adrenal gland: Secondary | ICD-10-CM

## 2016-08-16 DIAGNOSIS — E119 Type 2 diabetes mellitus without complications: Secondary | ICD-10-CM | POA: Diagnosis not present

## 2016-08-16 LAB — CBC & DIFF AND RETIC
BASO%: 0.4 % (ref 0.0–2.0)
Basophils Absolute: 0 10*3/uL (ref 0.0–0.1)
EOS%: 1.3 % (ref 0.0–7.0)
Eosinophils Absolute: 0.1 10*3/uL (ref 0.0–0.5)
HCT: 35.2 % (ref 34.8–46.6)
HGB: 11.3 g/dL — ABNORMAL LOW (ref 11.6–15.9)
Immature Retic Fract: 3.6 % (ref 1.60–10.00)
LYMPH%: 15.7 % (ref 14.0–49.7)
MCH: 27.3 pg (ref 25.1–34.0)
MCHC: 32.1 g/dL (ref 31.5–36.0)
MCV: 85 fL (ref 79.5–101.0)
MONO#: 0.8 10*3/uL (ref 0.1–0.9)
MONO%: 9.1 % (ref 0.0–14.0)
NEUT#: 6.6 10*3/uL — ABNORMAL HIGH (ref 1.5–6.5)
NEUT%: 73.5 % (ref 38.4–76.8)
Platelets: 377 10*3/uL (ref 145–400)
RBC: 4.14 10*6/uL (ref 3.70–5.45)
RDW: 17.9 % — ABNORMAL HIGH (ref 11.2–14.5)
Retic %: 1.8 % (ref 0.70–2.10)
Retic Ct Abs: 74.52 10*3/uL (ref 33.70–90.70)
WBC: 9 10*3/uL (ref 3.9–10.3)
lymph#: 1.4 10*3/uL (ref 0.9–3.3)

## 2016-08-16 LAB — COMPREHENSIVE METABOLIC PANEL
ALT: 15 U/L (ref 0–55)
AST: 15 U/L (ref 5–34)
Albumin: 3.8 g/dL (ref 3.5–5.0)
Alkaline Phosphatase: 61 U/L (ref 40–150)
Anion Gap: 11 mEq/L (ref 3–11)
BUN: 22.4 mg/dL (ref 7.0–26.0)
CO2: 25 mEq/L (ref 22–29)
Calcium: 10.1 mg/dL (ref 8.4–10.4)
Chloride: 99 mEq/L (ref 98–109)
Creatinine: 1.5 mg/dL — ABNORMAL HIGH (ref 0.6–1.1)
EGFR: 35 mL/min/{1.73_m2} — ABNORMAL LOW (ref >90)
Glucose: 109 mg/dl (ref 70–140)
Potassium: 5.2 mEq/L — ABNORMAL HIGH (ref 3.5–5.1)
Sodium: 135 mEq/L — ABNORMAL LOW (ref 136–145)
Total Bilirubin: 0.62 mg/dL (ref 0.20–1.20)
Total Protein: 7.2 g/dL (ref 6.4–8.3)

## 2016-08-16 LAB — LACTATE DEHYDROGENASE: LDH: 116 U/L — ABNORMAL LOW (ref 125–245)

## 2016-08-16 MED ORDER — SUNITINIB MALATE 37.5 MG PO CAPS: ORAL_CAPSULE | ORAL | 1 refills | Status: DC

## 2016-08-18 MED ORDER — HYDROCORTISONE 10 MG PO TABS: ORAL_TABLET | ORAL | 1 refills | Status: DC

## 2016-08-18 NOTE — Progress Notes (Signed)
Marland Kitchen    HEMATOLOGY/ONCOLOGY CLINIC NOTE  Date of Service: ..08/16/2016  PCP: Kristine Linea MD ([place]) Endocrinolgy - Dr Derrill Kay GI- Dr Bubba Camp  CHIEF COMPLAINTS/PURPOSE OF CONSULTATION:  Renal cell carcinoma  Anemia  HISTORY OF PRESENTING ILLNESS:   Audrey [name] is a wonderful 76 y.o. female who has been referred to [name] by Dr Kristine Linea MD  for evaluation and management of concern for newly diagnosed Renal cell carcinoma.  Patient has a history of hypertension, dyslipidemia, diabetes then presented to her primary care physician with hematuria and a urinary tract infection on 03/13/2016. Patient notes she was noted to have an Escherichia coli urinary tract infection and was treated with ciprofloxacin. She was noted to have significant anemia with a positive fecal occult blood test and subsequently had an EGD and colonoscopy with Dr. Bubba Camp in January 2018 and December 2017 respectively. No overt evidence of bleeding. Was noted to have a hiatal hernia with esophagitis.  Patient notes that she was having lower abdominal and left flank pain and progressive weight loss of 20 pounds along with anemia with a hemoglobin of 8.4 .she also reports that she was having chills every night and was taking Aleve. Notes aggressive shortness of breath with increased dyspnea on exertion and fatigue. Increased nausea with decreased by mouth fluid intake and fluid intake.   Patient had a CT of the chest on 05/22/2016 due to her low left rib/flank plain. This showed indeterminate bilateral pulmonary nodules with the largest one in the right middle lobe. Indeterminate bilateral adrenal nodules were noted. 1.9 cm left adrenal nodule and 2.4 cm right adrenal nodule.  Patient subsequently had an MRI of the abdomen with and without contrast to evaluate adrenal nodules. This was done on 06/04/2016 and showed a large heterogenous enhancing mass involving the left interpolar and lower  pole of the kidney measuring 8.8 cm x 6.9 cm x 7.6 cm. Left renal vein was patent. No definitive evidence of invasion of the posterior oblique musculature. No ascites. No upper abdominal lymphadenopathy. Heterogenous enhancing bilateral adrenal nodules were noted measuring 2.2 cm on the right and 1.5 cm on the left.  Patient was referred to Dr. Hulen Shouts at [facility] and has an appointment on 06/27/2016 for urology evaluation.  She was referred to [name] for further evaluation of her anemia. Currently notes no active hematuria or rectal bleeding. No melena.  INTERVAL HISTORY  Patient is here for her scheduled follow-up after having had her surgery on 08/01/2016. She knows that her surgery went well and she appears to be healing well. Pathology results were discussed in details and showed predominantly clear cell histology of her renal cell carcinoma. She notes that she is eating better. Hemoglobin has been stable and improved at 11.3. Patient notes that she feels somewhat cold and tremulous. Patient has had a history of being tremulous for a long time and this could certainly be from essential tremors. Notes a blood sugars somewhat elevated. Her sodium levels did normalize with physiologic replacement for her relative adrenal insufficiency. Her blood pressures at been good and she has completed the stress of her surgery and so we discussed weaning her off the steroids and monitoring. Her steroid dose was cut to half and will be discontinued after that if she is stable. 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 We discussed extensively further treatment recommendations since she has contralateral adrenal metastases and indeterminate  lung nodules. Her surgical incisions are healing well. We discussed the use of Sutent to be started another 1-2 weeks with a 2 week on 1 week off regimen with a slightly reduced dose at 37.5 mg  daily given her status of the single kidney with slightly elevated creatinine. No other acute new symptoms at this time.   MEDICAL HISTORY:   Hypertension Dyslipidemia Osteoarthritis Ex-smoker Coronary artery disease Thyroid disorder-was apparently on levothyroxine 25 g daily which has subsequently been discontinued . 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 Earley Brooke Colonoscopy 03/2016 Dr. Earley Brooke  SOCIAL HISTORY: Social History   Social History  . Marital status: Married    Spouse name: N/A  . Number of children: N/A  . Years of education: N/A   Occupational History  . Not on file.   Social History Main Topics  . Smoking status: Former Smoker    Years: 8.00    Quit date: 06/18/1964  . Smokeless tobacco: Never Used  . Alcohol use No  . Drug use: No  . Sexual activity: Not on file   Other Topics Concern  . Not on file   Social History Narrative  . No narrative on file   Patient lives in [place] Works as a Solicitor part-time Ex-smoker quit long time ago In 1961 . Smoked 2 packs per day for 8 years prior to that .  or a lot of stress since her daughter is also getting treated for stage IV uterine cancer.  FAMILY HISTORY:  Mother deceased Father with asthma and emphysema died at 24 years with the MI. Brother deceased of heart disease   ALLERGIES:  is allergic to adhesive [tape] and nsaids.  MEDICATIONS:  Current Outpatient Prescriptions  Medication Sig Dispense Refill  . acetaminophen (TYLENOL) 500 MG tablet Take 1,000 mg by mouth 2 (two) times daily.     Marland Kitchen atorvastatin (LIPITOR) 40 MG tablet Take 40 mg by mouth daily at 6 PM.     . clobetasol ointment (TEMOVATE) 0.76 % Apply 1 application topically 2 (two) times daily.    Marland Kitchen glimepiride (AMARYL) 1 MG tablet Take 1 mg by mouth every  evening.     . hydrochlorothiazide (HYDRODIURIL) 25 MG tablet Take 25 mg by mouth daily.    . hydrocortisone (CORTEF) 10 MG tablet 10mg  po with breakfast and 5 mg po with lunch 90 tablet 1  . levothyroxine (SYNTHROID, LEVOTHROID) 25 MCG tablet Take 25 mcg by mouth daily before breakfast.    . lisinopril (PRINIVIL,ZESTRIL) 40 MG tablet Take 40 mg by mouth every evening.     . SUNItinib (SUTENT) 37.5 MG capsule 37.5 mg po daily 2 weeks on 1 week off 28 capsule 1   No current facility-administered medications for this visit.     REVIEW OF SYSTEMS:    10 Point review of Systems was done is negative except as noted above.  PHYSICAL EXAMINATION: ECOG PERFORMANCE STATUS: 1 - Symptomatic but completely ambulatory  . Vitals:   08/16/16 1040  BP: (!) 177/66  Pulse: 74  Resp: 18  Temp: 97.8 F (36.6 C)   Filed Weights   08/16/16 1040  Weight: 126 lb (57.2 kg)   .Body mass index is 25.45 kg/m.  GENERAL:alert, in no acute distress and comfortable SKIN: skin color, texture, turgor are normal, no rashes or significant lesions EYES: normal, conjunctiva are pink and  non-injected, sclera clear OROPHARYNX:no exudate, no erythema and lips, buccal mucosa, and tongue normal  NECK: supple, no JVD, thyroid normal size, non-tender, without nodularity LYMPH:  no palpable lymphadenopathy in the cervical, axillary or inguinal LUNGS: clear to auscultation with normal respiratory effort HEART: regular rate & rhythm,  no murmurs and no lower extremity edema ABDOMEN: abdomen soft, non-tender, normoactive bowel sounds  Musculoskeletal: no cyanosis of digits and no clubbing  PSYCH: alert & oriented x 3 with fluent speech NEURO: no focal motor/sensory deficits  LABORATORY DATA:  I have reviewed the data as listed  . CBC Latest Ref Rng & Units 08/16/2016 08/03/2016 08/02/2016  WBC 3.9 - 10.3 10e3/uL 9.0 - -  Hemoglobin 11.6 - 15.9 g/dL 11.3(L) 8.5(L) 8.2(L)  Hematocrit 34.8 - 46.6 % 35.2 27.0(L)  26.2(L)  Platelets 145 - 400 10e3/uL 377 - -   . CBC    Component Value Date/Time   WBC 9.0 08/16/2016 1015   WBC 10.7 (H) 07/29/2016 1526   RBC 4.14 08/16/2016 1015   RBC 3.53 (L) 07/29/2016 1526   HGB 11.3 (L) 08/16/2016 1015   HCT 35.2 08/16/2016 1015   PLT 377 08/16/2016 1015   MCV 85.0 08/16/2016 1015   MCH 27.3 08/16/2016 1015   MCH 26.1 07/29/2016 1526   MCHC 32.1 08/16/2016 1015   MCHC 31.6 07/29/2016 1526   RDW 17.9 (H) 08/16/2016 1015   LYMPHSABS 1.4 08/16/2016 1015   MONOABS 0.8 08/16/2016 1015   EOSABS 0.1 08/16/2016 1015   BASOSABS 0.0 08/16/2016 1015     . CMP Latest Ref Rng & Units 08/16/2016 08/03/2016 08/02/2016  Glucose 70 - 140 mg/dl 109 109(H) 186(H)  BUN 7.0 - 26.0 mg/dL 22.4 21(H) 21(H)  Creatinine 0.6 - 1.1 mg/dL 1.5(H) 1.38(H) 1.22(H)  Sodium 136 - 145 mEq/L 135(L) 129(L) 127(L)  Potassium 3.5 - 5.1 mEq/L 5.2(H) 4.7 4.3  Chloride 101 - 111 mmol/L - 95(L) 93(L)  CO2 22 - 29 mEq/L 25 25 24   Calcium 8.4 - 10.4 mg/dL 10.1 8.9 8.7(L)  Total Protein 6.4 - 8.3 g/dL 7.2 - -  Total Bilirubin 0.20 - 1.20 mg/dL 0.62 - -  Alkaline Phos 40 - 150 U/L 61 - -  AST 5 - 34 U/L 15 - -  ALT 0 - 55 U/L 15 - -   B12 level 299 (OSH)  Component     Latest Ref Rng & Units 06/18/2016  IgG (Immunoglobin G), Serum     700 - 1,600 mg/dL 832  IgA/Immunoglobulin A, Serum     64 - 422 mg/dL 169  IgM, Qn, Serum     26 - 217 mg/dL 60  Total Protein     6.0 - 8.5 g/dL 7.0  Albumin SerPl Elph-Mcnc     2.9 - 4.4 g/dL 3.0  Alpha 1     0.0 - 0.4 g/dL 0.5 (H)  Alpha2 Glob SerPl Elph-Mcnc     0.4 - 1.0 g/dL 1.4 (H)  B-Globulin SerPl Elph-Mcnc     0.7 - 1.3 g/dL 1.1  Gamma Glob SerPl Elph-Mcnc     0.4 - 1.8 g/dL 0.9  M Protein SerPl Elph-Mcnc     Not Observed g/dL Not Observed  Globulin, Total     2.2 - 3.9 g/dL 4.0 (H)  Albumin/Glob SerPl     0.7 - 1.7 0.8  IFE 1      Comment  Please Note (HCV):      Comment  Iron     41 -  142 ug/dL 16 (L)  TIBC     236 - 444  ug/dL 276  UIBC     120 - 384 ug/dL 261  %SAT     21 - 57 % 6 (L)  T3 Uptake Ratio     24 - 39 % 26  Free Thyroxine Index     1.2 - 4.9 2.3  Sed Rate     0 - 40 mm/hr 84 (H)  CRP     0.0 - 4.9 mg/L 119.9 (H)  LDH     125 - 245 U/L 92 (L)  Ferritin     9 - 269 ng/ml 195  Vitamin B12     232 - 1,245 pg/mL 338  Thyroxine (T4)     4.5 - 12.0 ug/dL 8.8  T4,Free(Direct)     0.82 - 1.77 ng/dL 1.29  TSH     0.308 - 3.960 m(IU)/L 2.642         RADIOGRAPHIC STUDIES: I have personally reviewed the radiological images as listed and agreed with the findings in the report. No results found.  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 PET/CT is consistent with left kidney renal cell carcinoma.  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  #3 Small indeterminate pulmonary lesions -- cannot r/o ear;y metastases from [place]  MRI brain shows no evidence of metastatic disease  Plan -Patient is status post left radical nephrectomy and left adrenalectomy on 08/01/2016 and is recovering very well from surgery. Has a follow-up with Dr. Alinda Money from urology. -Hemoglobin is stable -We discussed in details possible systemic treatments for her metastatic predominantly clear cell renal cell carcinoma with some sarcomatoid differentiation. -After discussing all the pros and cons the patient is agreeable to pursue treatment with  Sutent. -We'll order Sutent 37.5 mg by mouth daily 2 weeks on one week off since her renal function has dropped somewhat with the absence of her left kidney. Will adjust the dose based on tolerance. -Recommended to stay physically active.  #4  Microcytic hypochromic anemia  primarily appears to be related to anemia of chronic disease related to her newly diagnosed malignancy.  Thyroid function is within normal limits. LDH within normal limits and suggests no evidence of hemolysis. . Lab Results  Component Value Date   IRON 23 (L) 07/18/2016   TIBC 251 07/18/2016   IRONPCTSAT 9 (L) 07/18/2016   (Iron and TIBC)  Lab Results  Component Value Date   FERRITIN 367 (H) 07/18/2016  Plan Hemoglobin improved to 11.3 after surgery   #5 moderate regarding calorie malnutrition Has lost 20 pounds in the last 1-2 months due to newly diagnosed malignancy. . Wt Readings from Last 3 Encounters:  08/16/16 126 lb (57.2 kg)  08/01/16 129 lb (58.5 kg)  07/29/16 129 lb 2 oz (58.6 kg)    Plan Patient notes she is eating better and hopefully we'll start gaining some weight .  #4 hyponatremia with hypercalcemia suggestive volume contracted state. Imaging thus  far has not shown any overt bony metastases. 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  Hyponatremia has now resolved and so has the hypercalcemia . Plan -recommend to drink atleast 48-64 oz of fluids daily --Since she has no hypoglycemia or hypotension and soda levels and normal we will attempt to wean off her hydrocortisone that she was started on for relative adrenal insufficiency. 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   continue follow-up with primary care physician for management of other medical comorbidities.  -Endocrinology  referral for mx of diabetes, possible hypothyroidism and adrenal insufficiency -RTC in 3 weeks with labs for a toxicity check on sutent -will get sutent prescription filled in the next 7-10 days   All of the patients questions were answered with apparent satisfaction. The patient knows to call the clinic with any problems, questions or concerns.  I spent 35 minutes counseling the patient face to face. The total time spent in the appointment was 40 minutes and more than 50% was on counseling and direct patient cares.    Sullivan Lone MD [place] AAHIVMS [facility] Hematology/Oncology Physician [facility]  (Office):       [phone] (Work cell):  [phone] (Fax):           [phone]

## 2016-08-19 ENCOUNTER — Telehealth: Payer: Self-pay

## 2016-08-19 NOTE — Telephone Encounter (Signed)
Pt called about hydrocortisone refill. She is currently taking 10 at breakfast and 5 at lunch. She is wondering when she needs to decrease this to continue weaning it off.   She has not gotten an appt for the referral to endocrinology placed on 4/27.

## 2016-08-20 ENCOUNTER — Encounter: Payer: Self-pay | Admitting: *Deleted

## 2016-08-20 NOTE — Progress Notes (Signed)
Referral sent to Dr Buddy Duty office for adrenal deficiency. Patient will be contacted with appt by their office. Patient informed and she verbalized understanding.

## 2016-08-21 ENCOUNTER — Telehealth: Payer: Self-pay | Admitting: Hematology

## 2016-08-21 ENCOUNTER — Telehealth: Payer: Self-pay | Admitting: Pharmacist

## 2016-08-21 DIAGNOSIS — C649 Malignant neoplasm of unspecified kidney, except renal pelvis: Secondary | ICD-10-CM

## 2016-08-21 MED ORDER — SUNITINIB MALATE 37.5 MG PO CAPS: ORAL_CAPSULE | ORAL | 1 refills | Status: DC

## 2016-08-21 NOTE — Telephone Encounter (Signed)
Per Almyra Free at [facility] called and confirmed patient already has an appt with Dr. Buddy Duty for 5/07 at 12 pm .

## 2016-08-21 NOTE — Telephone Encounter (Signed)
Scheduled appt per 4/27 los. - left message with appt date and time. Contacted Dr. Cindra Eves office and left a message for referral to Almyra Free at [facility]  - waiting on call back.

## 2016-08-21 NOTE — Telephone Encounter (Signed)
Oral Chemotherapy Pharmacist Encounter  Received new prescription for Sutent for the treatment of advanced renal cell carcinoma  CBC and CMET from 4/27 reviewed, OK for treatment, noted elevated SCr, this is being closely monitored 06/18/16 TSH = 2.642, noted patient on Synthroid 2/27 and 3/29 urinalysis show urine protein negative Patient does not have a history of heart failure, there is no baseline LVEF in Epic  Medication list in Epic assessed, some DDIs with Sutent identified:  Sutent and Amaryl: Category C interaction: monitor for hypoglycemia. Sutent is associated with symptomatic hypoglycemia and Amaryl is a hypoglycemic agent. Blood glucose levels In Epic reviewed, most levels in hyperglycemic range however it appears patient has had better glucose control in April. Patient will be counseled on this.  Sutent and lisinopril: Category B interaction, no action needed. Angiotensin-converting enzyme inhibitors have been shown to potentiate the hypoglycemic effect of hypoglycemic agents. Careful attention will be paid to patient's blood glucose levels.  There is no prescription insurance information in Epic so prescription has been e-scribed to [facility] for benefits analysis.  Oral oncology Clinic will continue to follow.  Johny Drilling, PharmD, BCPS, BCOP 08/21/2016  3:47 PM Oral Oncology Clinic [phone]

## 2016-08-22 ENCOUNTER — Telehealth: Payer: Self-pay | Admitting: Hematology

## 2016-08-22 NOTE — Telephone Encounter (Signed)
r/s appt per patient unable to make appt scheduled. - patient is aware of new appt date and time.

## 2016-08-23 ENCOUNTER — Telehealth: Payer: Self-pay | Admitting: Hematology

## 2016-08-23 NOTE — Telephone Encounter (Signed)
Scheduled appt per [place] and 4/27 los. Patient aware of new appt date and time.

## 2016-08-26 ENCOUNTER — Ambulatory Visit: Payer: Medicare Other | Admitting: Hematology

## 2016-08-26 ENCOUNTER — Other Ambulatory Visit: Payer: Medicare Other

## 2016-08-30 ENCOUNTER — Ambulatory Visit: Payer: Medicare Other | Admitting: Hematology

## 2016-08-30 ENCOUNTER — Other Ambulatory Visit: Payer: Medicare Other

## 2016-09-03 ENCOUNTER — Telehealth: Payer: Self-pay | Admitting: Pharmacist

## 2016-09-03 NOTE — Telephone Encounter (Signed)
Oral Chemotherapy Pharmacist Encounter  Patient does not have prescription insurance coverage and is in need of assistance obtaining her Sutent prescription. I called and discussed options with patient. Permission obtained to begin application process for [place] Patient Assistance Program to try to obtain Sutent at $0 out of pocket cost from the manufacturer.  I will meet patient in MD exam room after appointment on 5/17 to sign Pfizer application. Patient will bring proof of income documents to this appointment as well.  Once application is completed, we will fax to [employer]. Patient understands and is in agreement with above plan.  I also addressed a number of patient's concerns about side effect management with Sutent. Patient agrees to proceed with Sutent treatment at this time. Mrs. Sublette knows to contact the office with additional questions or concerns.  This encounter will continue to be updated until final determination.  Oral Oncology Clinic will continue to follow.   Johny Drilling, PharmD, BCPS, BCOP 09/03/2016  1:08 PM Oral Oncology Clinic [phone]

## 2016-09-03 NOTE — Telephone Encounter (Signed)
Oral Chemotherapy Pharmacist Encounter   I spoke with patient for overview of new oral chemotherapy medication: Sutent. Pt is doing well. Patient does not have prescription insurance coverage so Oral Oncology Clinic will assist patient in applying for manufacturer assistance.  Counseled patient on administration, dosing, side effects, safe handling, and monitoring. Patient will take 1 capsule (37.5mg  total) by mouth once daily without regard to food. She will take her Sutent for 2 weeks on and 1 week off, repeated every 21 days.  Side effects include but not limited to: N/V/D, mouth sores, fatigue, skin rash, hypertension.  Mrs. Milroy voiced understanding and appreciation.   All questions answered.  Will follow up with patient at MD appointment on 5/17 for completion of manufacturer assistance application. This will be updated in a separate encounter.  Patient knows to call the office with questions or concerns.  Thank you,  Johny Drilling, PharmD, BCPS, BCOP 09/03/2016  1:14 PM Oral Oncology Clinic [phone]

## 2016-09-05 ENCOUNTER — Encounter: Payer: Self-pay | Admitting: Hematology

## 2016-09-05 ENCOUNTER — Other Ambulatory Visit (HOSPITAL_BASED_OUTPATIENT_CLINIC_OR_DEPARTMENT_OTHER): Payer: Medicare Other

## 2016-09-05 ENCOUNTER — Ambulatory Visit (HOSPITAL_BASED_OUTPATIENT_CLINIC_OR_DEPARTMENT_OTHER): Payer: Medicare Other | Admitting: Hematology

## 2016-09-05 ENCOUNTER — Telehealth: Payer: Self-pay | Admitting: Hematology

## 2016-09-05 VITALS — BP 129/60 | HR 74 | Temp 97.6°F | Resp 18 | Ht 59.0 in | Wt 124.4 lb

## 2016-09-05 DIAGNOSIS — E440 Moderate protein-calorie malnutrition: Secondary | ICD-10-CM

## 2016-09-05 DIAGNOSIS — D649 Anemia, unspecified: Secondary | ICD-10-CM

## 2016-09-05 DIAGNOSIS — C7971 Secondary malignant neoplasm of right adrenal gland: Secondary | ICD-10-CM

## 2016-09-05 DIAGNOSIS — E119 Type 2 diabetes mellitus without complications: Secondary | ICD-10-CM | POA: Diagnosis not present

## 2016-09-05 DIAGNOSIS — C642 Malignant neoplasm of left kidney, except renal pelvis: Secondary | ICD-10-CM

## 2016-09-05 DIAGNOSIS — C649 Malignant neoplasm of unspecified kidney, except renal pelvis: Secondary | ICD-10-CM

## 2016-09-05 DIAGNOSIS — E2740 Unspecified adrenocortical insufficiency: Secondary | ICD-10-CM

## 2016-09-05 DIAGNOSIS — E039 Hypothyroidism, unspecified: Secondary | ICD-10-CM

## 2016-09-05 DIAGNOSIS — R7989 Other specified abnormal findings of blood chemistry: Secondary | ICD-10-CM

## 2016-09-05 DIAGNOSIS — C7972 Secondary malignant neoplasm of left adrenal gland: Secondary | ICD-10-CM

## 2016-09-05 DIAGNOSIS — R918 Other nonspecific abnormal finding of lung field: Secondary | ICD-10-CM

## 2016-09-05 DIAGNOSIS — E538 Deficiency of other specified B group vitamins: Secondary | ICD-10-CM

## 2016-09-05 LAB — COMPREHENSIVE METABOLIC PANEL
ALT: 29 U/L (ref 0–55)
AST: 20 U/L (ref 5–34)
Albumin: 3.9 g/dL (ref 3.5–5.0)
Alkaline Phosphatase: 80 U/L (ref 40–150)
Anion Gap: 9 mEq/L (ref 3–11)
BUN: 45.5 mg/dL — ABNORMAL HIGH (ref 7.0–26.0)
CO2: 24 mEq/L (ref 22–29)
Calcium: 10.7 mg/dL — ABNORMAL HIGH (ref 8.4–10.4)
Chloride: 107 mEq/L (ref 98–109)
Creatinine: 2.1 mg/dL — ABNORMAL HIGH (ref 0.6–1.1)
EGFR: 23 mL/min/{1.73_m2} — ABNORMAL LOW (ref >90)
Glucose: 117 mg/dl (ref 70–140)
Potassium: 5.1 mEq/L (ref 3.5–5.1)
Sodium: 140 mEq/L (ref 136–145)
Total Bilirubin: 0.33 mg/dL (ref 0.20–1.20)
Total Protein: 7.4 g/dL (ref 6.4–8.3)

## 2016-09-05 LAB — CBC & DIFF AND RETIC
BASO%: 0.8 % (ref 0.0–2.0)
Basophils Absolute: 0.1 10*3/uL (ref 0.0–0.1)
EOS%: 2.1 % (ref 0.0–7.0)
Eosinophils Absolute: 0.2 10*3/uL (ref 0.0–0.5)
HCT: 37.1 % (ref 34.8–46.6)
HGB: 11.8 g/dL (ref 11.6–15.9)
Immature Retic Fract: 2.4 % (ref 1.60–10.00)
LYMPH%: 17.3 % (ref 14.0–49.7)
MCH: 28.5 pg (ref 25.1–34.0)
MCHC: 31.8 g/dL (ref 31.5–36.0)
MCV: 89.6 fL (ref 79.5–101.0)
MONO#: 0.9 10*3/uL (ref 0.1–0.9)
MONO%: 11 % (ref 0.0–14.0)
NEUT#: 5.5 10*3/uL (ref 1.5–6.5)
NEUT%: 68.8 % (ref 38.4–76.8)
Platelets: 363 10*3/uL (ref 145–400)
RBC: 4.14 10*6/uL (ref 3.70–5.45)
RDW: 16.9 % — ABNORMAL HIGH (ref 11.2–14.5)
Retic %: 1.28 % (ref 0.70–2.10)
Retic Ct Abs: 52.99 10*3/uL (ref 33.70–90.70)
WBC: 8 10*3/uL (ref 3.9–10.3)
lymph#: 1.4 10*3/uL (ref 0.9–3.3)

## 2016-09-05 LAB — LACTATE DEHYDROGENASE: LDH: 112 U/L — ABNORMAL LOW (ref 125–245)

## 2016-09-05 LAB — UA PROTEIN, DIPSTICK - CHCC: Protein, ur: NEGATIVE mg/dL

## 2016-09-05 NOTE — Patient Instructions (Signed)
Thank you for choosing [facility] to provide your oncology and hematology care.  To afford each patient quality time with our providers, please arrive 30 minutes before your scheduled appointment time.  If you arrive late for your appointment, you may be asked to reschedule.  We strive to give you quality time with our providers, and arriving late affects you and other patients whose appointments are after yours.  If you are a no show for multiple scheduled visits, you may be dismissed from the clinic at the providers discretion.   Again, thank you for choosing [facility], our hope is that these requests will decrease the amount of time that you wait before being seen by our physicians.  ______________________________________________________________________ Should you have questions after your visit to the [facility], please contact our office at [phone] between the hours of 8:30 and 4:30 p.m.    Voicemails left after 4:30p.m will not be returned until the following business day.   For prescription refill requests, please have your pharmacy contact us directly.  Please also try to allow 48 hours for prescription requests.   Please contact the scheduling department for questions regarding scheduling.  For scheduling of procedures such as PET scans, CT scans, MRI, Ultrasound, etc please contact central scheduling at [phone].   Resources For Cancer Patients and Caregivers:  American Cancer Society:  800-227-2345  Can help patients locate various types of support and financial assistance Cancer Care: 1-800-813-HOPE (4673) Provides financial assistance, online support groups, medication/co-pay assistance.   [place] DSS:  336-641-3447 Where to apply for food stamps, Medicaid, and utility assistance Medicare Rights Center: 800-333-4114 Helps people with Medicare understand their rights and benefits, navigate the Medicare system, and secure the  quality healthcare they deserve SCAT: 336-333-6589 [place] [employer]'s shared-ride transportation service for eligible riders who have a disability that prevents them from riding the fixed route bus.   For additional information on assistance programs please contact our social worker:   Grier Hock/Abigail Elmore:  [phone] 

## 2016-09-05 NOTE — Telephone Encounter (Signed)
Oral Chemotherapy Pharmacist Encounter  Completed manufacturer assistance application faxed to [place] at [phone].  This encounter will continue to be updated until final determination.  Oral Oncology Clinic will continue to follow.   Johny Drilling, PharmD, BCPS, BCOP 09/05/2016  3:41 PM Oral Oncology Clinic [phone]

## 2016-09-05 NOTE — Telephone Encounter (Signed)
Gave patient AVS and calender per 5/17 los.  

## 2016-09-05 NOTE — Progress Notes (Signed)
Marland Kitchen    HEMATOLOGY/ONCOLOGY CLINIC NOTE  Date of Service: ..09/05/2016  PCP: Kristine Linea MD ([place]) Endocrinolgy - Dr Derrill Kay GI- Dr Bubba Camp  CHIEF COMPLAINTS/PURPOSE OF CONSULTATION:  Renal cell carcinoma  Anemia  HISTORY OF PRESENTING ILLNESS:   Audrey [name] is a wonderful 76 y.o. female who has been referred to [name] by Dr Kristine Linea MD  for evaluation and management of concern for newly diagnosed Renal cell carcinoma.  Patient has a history of hypertension, dyslipidemia, diabetes then presented to her primary care physician with hematuria and a urinary tract infection on 03/13/2016. Patient notes she was noted to have an Escherichia coli urinary tract infection and was treated with ciprofloxacin. She was noted to have significant anemia with a positive fecal occult blood test and subsequently had an EGD and colonoscopy with Dr. Bubba Camp in January 2018 and December 2017 respectively. No overt evidence of bleeding. Was noted to have a hiatal hernia with esophagitis.  Patient notes that she was having lower abdominal and left flank pain and progressive weight loss of 20 pounds along with anemia with a hemoglobin of 8.4 .she also reports that she was having chills every night and was taking Aleve. Notes aggressive shortness of breath with increased dyspnea on exertion and fatigue. Increased nausea with decreased by mouth fluid intake and fluid intake.   Patient had a CT of the chest on 05/22/2016 due to her low left rib/flank plain. This showed indeterminate bilateral pulmonary nodules with the largest one in the right middle lobe. Indeterminate bilateral adrenal nodules were noted. 1.9 cm left adrenal nodule and 2.4 cm right adrenal nodule.  Patient subsequently had an MRI of the abdomen with and without contrast to evaluate adrenal nodules. This was done on 06/04/2016 and showed a large heterogenous enhancing mass involving the left interpolar and lower  pole of the kidney measuring 8.8 cm x 6.9 cm x 7.6 cm. Left renal vein was patent. No definitive evidence of invasion of the posterior oblique musculature. No ascites. No upper abdominal lymphadenopathy. Heterogenous enhancing bilateral adrenal nodules were noted measuring 2.2 cm on the right and 1.5 cm on the left.  Patient was referred to Dr. Hulen Shouts at [facility] and has an appointment on 06/27/2016 for urology evaluation.  She was referred to [name] for further evaluation of her anemia. Currently notes no active hematuria or rectal bleeding. No melena.  INTERVAL HISTORY  Patient is here for her scheduled follow-up for her renal cell carcinoma for toxicity check after starting Sutent. She patient notes that she has not received her Sutent yet. Our oral chemotherapy pharmacist is working with her insurance to try to get it to her. Her blood counts are stable. She appears to be stronger and is eating better. All her questions about Sutent were addressed. She is following with Dr. Buddy Duty endocrinology and notes that her hydrocortisone dose has been reduced to 5mg  daily. No other acute new symptoms.  MEDICAL HISTORY:   Hypertension Dyslipidemia Osteoarthritis Ex-smoker Coronary artery disease Thyroid disorder-was apparently on levothyroxine 25 g daily which has subsequently been discontinued . 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 Earley Brooke Colonoscopy 03/2016 Dr. Earley Brooke  SOCIAL HISTORY: Social History   Social History  . Marital status: Married    Spouse name: N/A  . Number of children: N/A  . Years of education: N/A   Occupational  History  . Not on file.   Social History Main Topics  . Smoking status: Former Smoker    Years: 8.00    Quit date: 06/18/1964  . Smokeless tobacco: Never Used  . Alcohol use No  . Drug use: No  .  Sexual activity: Not on file   Other Topics Concern  . Not on file   Social History Narrative  . No narrative on file   Patient lives in [place] Works as a Solicitor part-time Ex-smoker quit long time ago In 1961 . Smoked 2 packs per day for 8 years prior to that .  or a lot of stress since her daughter is also getting treated for stage IV uterine cancer.  FAMILY HISTORY:  Mother deceased Father with asthma and emphysema died at 91 years with the MI. Brother deceased of heart disease   ALLERGIES:  is allergic to adhesive [tape]; nsaids; and statins.  MEDICATIONS:  Current Outpatient Prescriptions  Medication Sig Dispense Refill  . acetaminophen (TYLENOL) 500 MG tablet Take 1,000 mg by mouth 2 (two) times daily.     Marland Kitchen atorvastatin (LIPITOR) 40 MG tablet Take 40 mg by mouth daily at 6 PM.     . clobetasol ointment (TEMOVATE) 8.18 % Apply 1 application topically 2 (two) times daily.    Marland Kitchen glimepiride (AMARYL) 1 MG tablet Take 1 mg by mouth every evening.     . hydrochlorothiazide (HYDRODIURIL) 25 MG tablet Take 25 mg by mouth daily.    . hydrocortisone (CORTEF) 10 MG tablet 10mg  po with breakfast and 5 mg po with lunch (Patient taking differently: 5 mg. 10mg  po with breakfast and 5 mg po with lunch) 90 tablet 1  . levothyroxine (SYNTHROID, LEVOTHROID) 25 MCG tablet Take 25 mcg by mouth daily before breakfast.    . lisinopril (PRINIVIL,ZESTRIL) 40 MG tablet Take 40 mg by mouth every evening.     . SUNItinib (SUTENT) 37.5 MG capsule 37.5 mg po daily 2 weeks on 1 week off 28 capsule 1   No current facility-administered medications for this visit.     REVIEW OF SYSTEMS:    10 Point review of Systems was done is negative except as noted above.  PHYSICAL EXAMINATION: ECOG PERFORMANCE STATUS: 1 - Symptomatic but completely ambulatory  . Vitals:   09/05/16 1007  BP: 129/60  Pulse: 74  Resp: 18  Temp: 97.6 F (36.4 C)   Filed Weights   09/05/16 1007    Weight: 124 lb 6.4 oz (56.4 kg)   .Body mass index is 25.13 kg/m.  GENERAL:alert, in no acute distress and comfortable SKIN: skin color, texture, turgor are normal, no rashes or significant lesions EYES: normal, conjunctiva are pink and non-injected, sclera clear OROPHARYNX:no exudate, no erythema and lips, buccal mucosa, and tongue normal  NECK: supple, no JVD, thyroid normal size, non-tender, without nodularity LYMPH:  no palpable lymphadenopathy in the cervical, axillary or inguinal LUNGS: clear to auscultation with normal respiratory effort HEART: regular rate & rhythm,  no murmurs and no lower extremity edema ABDOMEN: abdomen soft, non-tender, normoactive bowel sounds  Musculoskeletal: no cyanosis of digits and no clubbing  PSYCH: alert & oriented x 3 with fluent speech NEURO: no focal motor/sensory deficits  LABORATORY DATA:  I have reviewed the data as listed  . CBC Latest Ref Rng & Units 09/05/2016 08/16/2016 08/03/2016  WBC 3.9 - 10.3 10e3/uL 8.0 9.0 -  Hemoglobin 11.6 - 15.9 g/dL 11.8 11.3(L) 8.5(L)  Hematocrit 34.8 - 46.6 %  37.1 35.2 27.0(L)  Platelets 145 - 400 10e3/uL 363 377 -   . CBC    Component Value Date/Time   WBC 8.0 09/05/2016 0944   WBC 10.7 (H) 07/29/2016 1526   RBC 4.14 09/05/2016 0944   RBC 3.53 (L) 07/29/2016 1526   HGB 11.8 09/05/2016 0944   HCT 37.1 09/05/2016 0944   PLT 363 09/05/2016 0944   MCV 89.6 09/05/2016 0944   MCH 28.5 09/05/2016 0944   MCH 26.1 07/29/2016 1526   MCHC 31.8 09/05/2016 0944   MCHC 31.6 07/29/2016 1526   RDW 16.9 (H) 09/05/2016 0944   LYMPHSABS 1.4 09/05/2016 0944   MONOABS 0.9 09/05/2016 0944   EOSABS 0.2 09/05/2016 0944   BASOSABS 0.1 09/05/2016 0944     . CMP Latest Ref Rng & Units 09/05/2016 08/16/2016 08/03/2016  Glucose 70 - 140 mg/dl 117 109 109(H)  BUN 7.0 - 26.0 mg/dL 45.5(H) 22.4 21(H)  Creatinine 0.6 - 1.1 mg/dL 2.1(H) 1.5(H) 1.38(H)  Sodium 136 - 145 mEq/L 140 135(L) 129(L)  Potassium 3.5 - 5.1 mEq/L  5.1 5.2(H) 4.7  Chloride 101 - 111 mmol/L - - 95(L)  CO2 22 - 29 mEq/L 24 25 25   Calcium 8.4 - 10.4 mg/dL 10.7(H) 10.1 8.9  Total Protein 6.4 - 8.3 g/dL 7.4 7.2 -  Total Bilirubin 0.20 - 1.20 mg/dL 0.33 0.62 -  Alkaline Phos 40 - 150 U/L 80 61 -  AST 5 - 34 U/L 20 15 -  ALT 0 - 55 U/L 29 15 -   B12 level 299 (OSH)  Component     Latest Ref Rng & Units 06/18/2016  IgG (Immunoglobin G), Serum     700 - 1,600 mg/dL 832  IgA/Immunoglobulin A, Serum     64 - 422 mg/dL 169  IgM, Qn, Serum     26 - 217 mg/dL 60  Total Protein     6.0 - 8.5 g/dL 7.0  Albumin SerPl Elph-Mcnc     2.9 - 4.4 g/dL 3.0  Alpha 1     0.0 - 0.4 g/dL 0.5 (H)  Alpha2 Glob SerPl Elph-Mcnc     0.4 - 1.0 g/dL 1.4 (H)  B-Globulin SerPl Elph-Mcnc     0.7 - 1.3 g/dL 1.1  Gamma Glob SerPl Elph-Mcnc     0.4 - 1.8 g/dL 0.9  M Protein SerPl Elph-Mcnc     Not Observed g/dL Not Observed  Globulin, Total     2.2 - 3.9 g/dL 4.0 (H)  Albumin/Glob SerPl     0.7 - 1.7 0.8  IFE 1      Comment  Please Note (HCV):      Comment  Iron     41 - 142 ug/dL 16 (L)  TIBC     236 - 444 ug/dL 276  UIBC     120 - 384 ug/dL 261  %SAT     21 - 57 % 6 (L)  T3 Uptake Ratio     24 - 39 % 26  Free Thyroxine Index     1.2 - 4.9 2.3  Sed Rate     0 - 40 mm/hr 84 (H)  CRP     0.0 - 4.9 mg/L 119.9 (H)  LDH     125 - 245 U/L 92 (L)  Ferritin     9 - 269 ng/ml 195  Vitamin B12     232 - 1,245 pg/mL 338  Thyroxine (T4)     4.5 - 12.0 ug/dL 8.8  T4,Free(Direct)     0.82 - 1.77 ng/dL 1.29  TSH     0.308 - 3.960 m(IU)/L 2.642         RADIOGRAPHIC STUDIES: I have personally reviewed the radiological images as listed and agreed with the findings in the report. No results found.  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 PET/CT is consistent with left kidney renal cell carcinoma.  Rt adrenal gland bx - showed clear cell RCC  s/p left radical nephrectomy and left adrenal gland resection on 08/01/2016 by Dr Alinda Money.  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  #3 Small indeterminate pulmonary lesions -- cannot r/o ear;y metastases from [place]  MRI brain shows no evidence of metastatic disease  Plan -Patient still awaiting Sutent 37.5 mg by mouth daily 2 weeks on one week off  -Had additional paperwork taken care of with our oral chemotherapy pharmacist today to try to get the medication. -Slight bump in creatinine. Appears to be somewhat dehydrated and recommended to increase her oral water intake. --Recommended to stay physically active. -will get rpt PET/CT to reassess the status of her RCC since it has been nearly 3 months since last imaging.  #4  Microcytic hypochromic anemia  primarily appears to be related to anemia of chronic disease related to her newly diagnosed malignancy.  Thyroid function is within normal limits. LDH within normal limits and suggests no evidence of hemolysis. . Lab Results  Component Value Date   IRON 23 (L) 07/18/2016   TIBC 251 07/18/2016   IRONPCTSAT 9 (L) 07/18/2016   (Iron and TIBC)  Lab Results  Component Value Date   FERRITIN 367 (H) 07/18/2016  Plan Hemoglobin has improved progressively to 11.8 after surgery  #5 moderate regarding calorie malnutrition Has lost 20 pounds in the last 1-2 months due to newly diagnosed malignancy. . Wt Readings from Last 3 Encounters:  09/05/16 124 lb 6.4 oz (56.4 kg)  08/16/16 126 lb (57.2 kg)  08/01/16 129 lb (58.5 kg)    Plan Patient notes she is  eating better and hopefully we'll start gaining some weight . Weight is somewhat lower likely from some element of dehydration as well. -recommend to drink atleast 48-64 oz of fluids daily  #6 Hypothyroidism/Adrenal insufficiency/Diabetes -Continue F/u with Dr Buddy Duty   continue follow-up with primary care physician for management of other medical comorbidities.      All of the patients questions were answered with apparent satisfaction. The patient knows to call the clinic with any problems, questions or concerns.  I spent 35 minutes counseling the patient face to face. The total time spent in the appointment was 40 minutes and more than 50% was on counseling and direct patient cares.    Sullivan Lone MD [place] AAHIVMS [facility] Hematology/Oncology Physician [facility]  (Office):       [phone] (Work cell):  [phone] (Fax):           [phone]

## 2016-09-11 ENCOUNTER — Telehealth: Payer: Self-pay

## 2016-09-11 NOTE — Telephone Encounter (Signed)
Pt states Dr Irene Limbo was going order a PET, it is not scheduled yet. Noted PA not done. Message sent to [place], and [place].

## 2016-09-18 NOTE — Telephone Encounter (Signed)
Oral Chemotherapy Pharmacist Encounter  Received notification from [facility] that patient has been successfully enrolled into their program to receive Sutent at $0 out of pocket cost from the manufacturer Effective dates: 09/06/16-09/06/17  I requested 1st shipment be mailed to patient today. Patient should receive it tomorrow (5/31).  I called and alerted patient to good news. I instructed patient to call [employer] to request next shipment 7-10 days before she needs it. [place] phone number provided: [phone].  I confined PET scan appt on 6/6 and MD office visit appt on 6/15 with patient.  Patient knows to call the office with any questions or concerns.  Oral Oncology Clinic will continue to follow.  Johny Drilling, PharmD, BCPS, BCOP 09/18/2016  10:57 AM Oral Oncology Clinic [phone]

## 2016-09-25 ENCOUNTER — Ambulatory Visit (HOSPITAL_COMMUNITY)
Admission: RE | Admit: 2016-09-25 | Discharge: 2016-09-25 | Disposition: A | Discharge status: Home / Self Care | Payer: Medicare Other | Source: Ambulatory Visit | Attending: Hematology | Admitting: Hematology

## 2016-09-25 DIAGNOSIS — R918 Other nonspecific abnormal finding of lung field: Secondary | ICD-10-CM | POA: Diagnosis not present

## 2016-09-25 DIAGNOSIS — C642 Malignant neoplasm of left kidney, except renal pelvis: Secondary | ICD-10-CM

## 2016-09-25 DIAGNOSIS — E119 Type 2 diabetes mellitus without complications: Secondary | ICD-10-CM | POA: Insufficient documentation

## 2016-09-25 LAB — GLUCOSE, CAPILLARY: Glucose-Capillary: 92 mg/dL (ref 65–99)

## 2016-09-25 MED ORDER — FLUDEOXYGLUCOSE F - 18 (FDG) INJECTION
5.8000 | Freq: Once | INTRAVENOUS | Status: AC
  Administered 2016-09-25: 5.8 via INTRAVENOUS

## 2016-10-04 ENCOUNTER — Encounter: Payer: Self-pay | Admitting: Hematology

## 2016-10-04 ENCOUNTER — Other Ambulatory Visit (HOSPITAL_BASED_OUTPATIENT_CLINIC_OR_DEPARTMENT_OTHER): Payer: Medicare Other

## 2016-10-04 ENCOUNTER — Ambulatory Visit (HOSPITAL_BASED_OUTPATIENT_CLINIC_OR_DEPARTMENT_OTHER): Payer: Medicare Other | Admitting: Hematology

## 2016-10-04 ENCOUNTER — Telehealth: Payer: Self-pay | Admitting: Hematology

## 2016-10-04 VITALS — BP 154/71 | HR 66 | Temp 97.5°F | Resp 18 | Ht 59.0 in | Wt 130.2 lb

## 2016-10-04 DIAGNOSIS — E119 Type 2 diabetes mellitus without complications: Secondary | ICD-10-CM

## 2016-10-04 DIAGNOSIS — C7972 Secondary malignant neoplasm of left adrenal gland: Secondary | ICD-10-CM

## 2016-10-04 DIAGNOSIS — C7971 Secondary malignant neoplasm of right adrenal gland: Secondary | ICD-10-CM | POA: Diagnosis not present

## 2016-10-04 DIAGNOSIS — E2740 Unspecified adrenocortical insufficiency: Secondary | ICD-10-CM | POA: Diagnosis not present

## 2016-10-04 DIAGNOSIS — D509 Iron deficiency anemia, unspecified: Secondary | ICD-10-CM | POA: Diagnosis not present

## 2016-10-04 DIAGNOSIS — E440 Moderate protein-calorie malnutrition: Secondary | ICD-10-CM | POA: Diagnosis not present

## 2016-10-04 DIAGNOSIS — E039 Hypothyroidism, unspecified: Secondary | ICD-10-CM

## 2016-10-04 DIAGNOSIS — C642 Malignant neoplasm of left kidney, except renal pelvis: Secondary | ICD-10-CM | POA: Diagnosis present

## 2016-10-04 DIAGNOSIS — C649 Malignant neoplasm of unspecified kidney, except renal pelvis: Secondary | ICD-10-CM

## 2016-10-04 DIAGNOSIS — E538 Deficiency of other specified B group vitamins: Secondary | ICD-10-CM

## 2016-10-04 DIAGNOSIS — J984 Other disorders of lung: Secondary | ICD-10-CM | POA: Diagnosis not present

## 2016-10-04 LAB — CBC & DIFF AND RETIC
BASO%: 0.6 % (ref 0.0–2.0)
Basophils Absolute: 0 10*3/uL (ref 0.0–0.1)
EOS%: 2 % (ref 0.0–7.0)
Eosinophils Absolute: 0.1 10*3/uL (ref 0.0–0.5)
HCT: 39.8 % (ref 34.8–46.6)
HGB: 13.6 g/dL (ref 11.6–15.9)
Immature Retic Fract: 3.7 % (ref 1.60–10.00)
LYMPH%: 15.7 % (ref 14.0–49.7)
MCH: 29.2 pg (ref 25.1–34.0)
MCHC: 34.2 g/dL (ref 31.5–36.0)
MCV: 85.6 fL (ref 79.5–101.0)
MONO#: 0.4 10*3/uL (ref 0.1–0.9)
MONO%: 7.2 % (ref 0.0–14.0)
NEUT#: 3.7 10*3/uL (ref 1.5–6.5)
NEUT%: 74.5 % (ref 38.4–76.8)
Platelets: 224 10*3/uL (ref 145–400)
RBC: 4.65 10*6/uL (ref 3.70–5.45)
RDW: 15.1 % — ABNORMAL HIGH (ref 11.2–14.5)
Retic %: 0.91 % (ref 0.70–2.10)
Retic Ct Abs: 42.32 10*3/uL (ref 33.70–90.70)
WBC: 5 10*3/uL (ref 3.9–10.3)
lymph#: 0.8 10*3/uL — ABNORMAL LOW (ref 0.9–3.3)
nRBC: 0 % (ref 0–0)

## 2016-10-04 LAB — UA PROTEIN, DIPSTICK - CHCC: Protein, ur: <30 mg/dL

## 2016-10-04 LAB — COMPREHENSIVE METABOLIC PANEL
ALT: 19 U/L (ref 0–55)
AST: 22 U/L (ref 5–34)
Albumin: 3.7 g/dL (ref 3.5–5.0)
Alkaline Phosphatase: 89 U/L (ref 40–150)
Anion Gap: 12 mEq/L — ABNORMAL HIGH (ref 3–11)
BUN: 26.9 mg/dL — ABNORMAL HIGH (ref 7.0–26.0)
CO2: 23 mEq/L (ref 22–29)
Calcium: 9.1 mg/dL (ref 8.4–10.4)
Chloride: 99 mEq/L (ref 98–109)
Creatinine: 1.9 mg/dL — ABNORMAL HIGH (ref 0.6–1.1)
EGFR: 25 mL/min/{1.73_m2} — ABNORMAL LOW (ref >90)
Glucose: 142 mg/dl — ABNORMAL HIGH (ref 70–140)
Potassium: 4.7 mEq/L (ref 3.5–5.1)
Sodium: 134 mEq/L — ABNORMAL LOW (ref 136–145)
Total Bilirubin: 0.43 mg/dL (ref 0.20–1.20)
Total Protein: 6.9 g/dL (ref 6.4–8.3)

## 2016-10-04 LAB — LACTATE DEHYDROGENASE: LDH: 149 U/L (ref 125–245)

## 2016-10-04 NOTE — Patient Instructions (Signed)
Thank you for choosing [facility] to provide your oncology and hematology care.  To afford each patient quality time with our providers, please arrive 30 minutes before your scheduled appointment time.  If you arrive late for your appointment, you may be asked to reschedule.  We strive to give you quality time with our providers, and arriving late affects you and other patients whose appointments are after yours.  If you are a no show for multiple scheduled visits, you may be dismissed from the clinic at the providers discretion.   Again, thank you for choosing  Cancer Center, our hope is that these requests will decrease the amount of time that you wait before being seen by our physicians.  ______________________________________________________________________ Should you have questions after your visit to the  Cancer Center, please contact our office at [phone] between the hours of 8:30 and 4:30 p.m.    Voicemails left after 4:30p.m will not be returned until the following business day.   For prescription refill requests, please have your pharmacy contact us directly.  Please also try to allow 48 hours for prescription requests.   Please contact the scheduling department for questions regarding scheduling.  For scheduling of procedures such as PET scans, CT scans, MRI, Ultrasound, etc please contact central scheduling at [phone].   Resources For Cancer Patients and Caregivers:  American Cancer Society:  800-227-2345  Can help patients locate various types of support and financial assistance Cancer Care: 1-800-813-HOPE (4673) Provides financial assistance, online support groups, medication/co-pay assistance.   [place] DSS:  336-641-3447 Where to apply for food stamps, Medicaid, and utility assistance Medicare Rights Center: 800-333-4114 Helps people with Medicare understand their rights and benefits, navigate the Medicare system, and secure the  quality healthcare they deserve SCAT: 336-333-6589 [place] [employer]'s shared-ride transportation service for eligible riders who have a disability that prevents them from riding the fixed route bus.   For additional information on assistance programs please contact our social worker:   Grier Hock/Abigail Elmore:  [phone] 

## 2016-10-04 NOTE — Telephone Encounter (Signed)
Appointments scheduled, per 10/04/16 los. Patient was given a copy of the AVS report and appointment schedule, per 10/04/16 los.

## 2016-10-05 LAB — VITAMIN B12: Vitamin B12: 455 pg/mL (ref 232–1245)

## 2016-10-07 NOTE — Progress Notes (Signed)
Marland Kitchen    HEMATOLOGY/ONCOLOGY CLINIC NOTE  Date of Service: ..10/04/2016  PCP: Kristine Linea MD ([place]) Endocrinolgy - Dr Derrill Kay GI- Dr Bubba Camp  CHIEF COMPLAINTS/PURPOSE OF CONSULTATION:  Renal cell carcinoma  Anemia  HISTORY OF PRESENTING ILLNESS:   Audrey [name] is a wonderful 76 y.o. female who has been referred to [name] by Dr Kristine Linea MD  for evaluation and management of concern for newly diagnosed Renal cell carcinoma.  Patient has a history of hypertension, dyslipidemia, diabetes then presented to her primary care physician with hematuria and a urinary tract infection on 03/13/2016. Patient notes she was noted to have an Escherichia coli urinary tract infection and was treated with ciprofloxacin. She was noted to have significant anemia with a positive fecal occult blood test and subsequently had an EGD and colonoscopy with Dr. Bubba Camp in January 2018 and December 2017 respectively. No overt evidence of bleeding. Was noted to have a hiatal hernia with esophagitis.  Patient notes that she was having lower abdominal and left flank pain and progressive weight loss of 20 pounds along with anemia with a hemoglobin of 8.4 .she also reports that she was having chills every night and was taking Aleve. Notes aggressive shortness of breath with increased dyspnea on exertion and fatigue. Increased nausea with decreased by mouth fluid intake and fluid intake.   Patient had a CT of the chest on 05/22/2016 due to her low left rib/flank plain. This showed indeterminate bilateral pulmonary nodules with the largest one in the right middle lobe. Indeterminate bilateral adrenal nodules were noted. 1.9 cm left adrenal nodule and 2.4 cm right adrenal nodule.  Patient subsequently had an MRI of the abdomen with and without contrast to evaluate adrenal nodules. This was done on 06/04/2016 and showed a large heterogenous enhancing mass involving the left interpolar and lower  pole of the kidney measuring 8.8 cm x 6.9 cm x 7.6 cm. Left renal vein was patent. No definitive evidence of invasion of the posterior oblique musculature. No ascites. No upper abdominal lymphadenopathy. Heterogenous enhancing bilateral adrenal nodules were noted measuring 2.2 cm on the right and 1.5 cm on the left.  Patient was referred to [facility]. Hulen Shouts at [facility] and has an appointment on 06/27/2016 for urology evaluation.  She was referred to [name] for further evaluation of her anemia. Currently notes no active hematuria or rectal bleeding. No melena.  INTERVAL HISTORY  Patient is here for her scheduled follow-up for her renal cell carcinoma for toxicity check after starting Sutent. Patient notes that she has started Sutent about 2-1/2 weeks ago. Notes no issues with tolerating it. Her tremulousness has improved. She is on minimal doses of hydrocortisone as per endocrinology for adrenal insufficiency. PET/CT scan results and images were reviewed in detail. No rashes. No other prohibitive toxicity from the Sutent at this time. Her granddaughter accompanied her for this visit. Patient notes that her diabetes has been well controlled. Somewhat soft stools but no overt diarrhea.  MEDICAL HISTORY:   Hypertension Dyslipidemia Osteoarthritis Ex-smoker Coronary artery disease Thyroid disorder-was apparently on levothyroxine 25 g daily which has subsequently been discontinued . 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 Earley Brooke Colonoscopy 03/2016 Dr. Earley Brooke  SOCIAL HISTORY: Social History   Social History  . Marital status: Married    Spouse name: N/A  . Number of children: N/A  . Years  of education: N/A   Occupational History  . Not on file.   Social History Main Topics  . Smoking status: Former Smoker    Years: 8.00    Quit date:  06/18/1964  . Smokeless tobacco: Never Used  . Alcohol use No  . Drug use: No  . Sexual activity: Not on file   Other Topics Concern  . Not on file   Social History Narrative  . No narrative on file   Patient lives in [place] Works as a Solicitor part-time Ex-smoker quit long time ago In 1961 . Smoked 2 packs per day for 8 years prior to that .  or a lot of stress since her daughter is also getting treated for stage IV uterine cancer.  FAMILY HISTORY:  Mother deceased Father with asthma and emphysema died at 86 years with the MI. Brother deceased of heart disease   ALLERGIES:  is allergic to adhesive [tape]; nsaids; and statins.  MEDICATIONS:  Current Outpatient Prescriptions  Medication Sig Dispense Refill  . acetaminophen (TYLENOL) 500 MG tablet Take 1,000 mg by mouth 2 (two) times daily.     Marland Kitchen atorvastatin (LIPITOR) 40 MG tablet Take 40 mg by mouth daily at 6 PM.     . clobetasol ointment (TEMOVATE) 4.23 % Apply 1 application topically 2 (two) times daily.    Marland Kitchen glimepiride (AMARYL) 1 MG tablet Take 1 mg by mouth every evening.     . hydrochlorothiazide (HYDRODIURIL) 25 MG tablet Take 25 mg by mouth daily.    . hydrocortisone (CORTEF) 10 MG tablet 10mg  po with breakfast and 5 mg po with lunch (Patient taking differently: 5 mg. 10mg  po with breakfast and 5 mg po with lunch) 90 tablet 1  . levothyroxine (SYNTHROID, LEVOTHROID) 25 MCG tablet Take 25 mcg by mouth daily before breakfast.    . lisinopril (PRINIVIL,ZESTRIL) 40 MG tablet Take 40 mg by mouth every evening.     . SUNItinib (SUTENT) 37.5 MG capsule 37.5 mg po daily 2 weeks on 1 week off 28 capsule 1   No current facility-administered medications for this visit.     REVIEW OF SYSTEMS:    10 Point review of Systems was done is negative except as noted above.  PHYSICAL EXAMINATION: ECOG PERFORMANCE STATUS: 1 - Symptomatic but completely ambulatory  . Vitals:   10/04/16 0925  BP: (!) 154/71    Pulse: 66  Resp: 18  Temp: 97.5 F (36.4 C)   Filed Weights   10/04/16 0925  Weight: 130 lb 3.2 oz (59.1 kg)   .Body mass index is 26.3 kg/m.  GENERAL:alert, in no acute distress and comfortable SKIN: skin color, texture, turgor are normal, no rashes or significant lesions EYES: normal, conjunctiva are pink and non-injected, sclera clear OROPHARYNX:no exudate, no erythema and lips, buccal mucosa, and tongue normal  NECK: supple, no JVD, thyroid normal size, non-tender, without nodularity LYMPH:  no palpable lymphadenopathy in the cervical, axillary or inguinal LUNGS: clear to auscultation with normal respiratory effort HEART: regular rate & rhythm,  no murmurs and no lower extremity edema ABDOMEN: abdomen soft, non-tender, normoactive bowel sounds  Musculoskeletal: no cyanosis of digits and no clubbing  PSYCH: alert & oriented x 3 with fluent speech NEURO: no focal motor/sensory deficits  LABORATORY DATA:  I have reviewed the data as listed  . CBC Latest Ref Rng & Units 10/04/2016 09/05/2016 08/16/2016  WBC 3.9 - 10.3 10e3/uL 5.0 8.0 9.0  Hemoglobin 11.6 - 15.9 g/dL 13.6 11.8  11.3(L)  Hematocrit 34.8 - 46.6 % 39.8 37.1 35.2  Platelets 145 - 400 10e3/uL 224 363 377   . CBC    Component Value Date/Time   WBC 5.0 10/04/2016 0858   WBC 10.7 (H) 07/29/2016 1526   RBC 4.65 10/04/2016 0858   RBC 3.53 (L) 07/29/2016 1526   HGB 13.6 10/04/2016 0858   HCT 39.8 10/04/2016 0858   PLT 224 10/04/2016 0858   MCV 85.6 10/04/2016 0858   MCH 29.2 10/04/2016 0858   MCH 26.1 07/29/2016 1526   MCHC 34.2 10/04/2016 0858   MCHC 31.6 07/29/2016 1526   RDW 15.1 (H) 10/04/2016 0858   LYMPHSABS 0.8 (L) 10/04/2016 0858   MONOABS 0.4 10/04/2016 0858   EOSABS 0.1 10/04/2016 0858   BASOSABS 0.0 10/04/2016 0858     . CMP Latest Ref Rng & Units 10/04/2016 09/05/2016 08/16/2016  Glucose 70 - 140 mg/dl 142(H) 117 109  BUN 7.0 - 26.0 mg/dL 26.9(H) 45.5(H) 22.4  Creatinine 0.6 - 1.1 mg/dL  1.9(H) 2.1(H) 1.5(H)  Sodium 136 - 145 mEq/L 134(L) 140 135(L)  Potassium 3.5 - 5.1 mEq/L 4.7 5.1 5.2(H)  Chloride 101 - 111 mmol/L - - -  CO2 22 - 29 mEq/L 23 24 25   Calcium 8.4 - 10.4 mg/dL 9.1 10.7(H) 10.1  Total Protein 6.4 - 8.3 g/dL 6.9 7.4 7.2  Total Bilirubin 0.20 - 1.20 mg/dL 0.43 0.33 0.62  Alkaline Phos 40 - 150 U/L 89 80 61  AST 5 - 34 U/L 22 20 15   ALT 0 - 55 U/L 19 29 15    B12 level 299 (OSH)  Component     Latest Ref Rng & Units 06/18/2016  IgG (Immunoglobin G), Serum     700 - 1,600 mg/dL 832  IgA/Immunoglobulin A, Serum     64 - 422 mg/dL 169  IgM, Qn, Serum     26 - 217 mg/dL 60  Total Protein     6.0 - 8.5 g/dL 7.0  Albumin SerPl Elph-Mcnc     2.9 - 4.4 g/dL 3.0  Alpha 1     0.0 - 0.4 g/dL 0.5 (H)  Alpha2 Glob SerPl Elph-Mcnc     0.4 - 1.0 g/dL 1.4 (H)  B-Globulin SerPl Elph-Mcnc     0.7 - 1.3 g/dL 1.1  Gamma Glob SerPl Elph-Mcnc     0.4 - 1.8 g/dL 0.9  M Protein SerPl Elph-Mcnc     Not Observed g/dL Not Observed  Globulin, Total     2.2 - 3.9 g/dL 4.0 (H)  Albumin/Glob SerPl     0.7 - 1.7 0.8  IFE 1      Comment  Please Note (HCV):      Comment  Iron     41 - 142 ug/dL 16 (L)  TIBC     236 - 444 ug/dL 276  UIBC     120 - 384 ug/dL 261  %SAT     21 - 57 % 6 (L)  T3 Uptake Ratio     24 - 39 % 26  Free Thyroxine Index     1.2 - 4.9 2.3  Sed Rate     0 - 40 mm/hr 84 (H)  CRP     0.0 - 4.9 mg/L 119.9 (H)  LDH     125 - 245 U/L 92 (L)  Ferritin     9 - 269 ng/ml 195  Vitamin B12     232 - 1,245 pg/mL 338  Thyroxine (T4)  4.5 - 12.0 ug/dL 8.8  T4,Free(Direct)     0.82 - 1.77 ng/dL 1.29  TSH     0.308 - 3.960 m(IU)/L 2.642         RADIOGRAPHIC STUDIES: I have personally reviewed the radiological images as listed and agreed with the findings in the report. 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 EXAM: NUCLEAR MEDICINE PET SKULL BASE TO THIGH TECHNIQUE: 5.8 MCi F-18 FDG was injected intravenously. Full-ring PET imaging was performed from the skull base to thigh after the radiotracer. CT data was obtained and used for attenuation correction and anatomic localization. FASTING BLOOD GLUCOSE:  Value: 92 mg/dl COMPARISON:  06/26/2016 FINDINGS: NECK No hypermetabolic lymph nodes in the neck. CHEST New hypermetabolic focus identified in the posterior right hilum with SUV max = 5.5. There does appear to be a lymph node at this location on the CT data. Trace pericardial effusion is new in the interval. Coronary artery calcification is evident. Thoracic aortic atherosclerosis noted. 5 mm right middle lobe nodule seen on prior study measures 6 mm today, similar to previous. The 3 mm right lower lobe pulmonary nodules unchanged. A new 5 mm nodule is identified posterior left lower lobe (image 39 series 8). No hypermetabolism seen in any of these nodules on PET imaging, but size and each is below the accepted threshold for reliable resolution on PET scanning. ABDOMEN/PELVIS Focus FDG accumulation is identified in the region of the left para-aortic space, at the level of the previous location of the left renal artery. Patient is status post interval left adrenalectomy and left nephrectomy. The area of FDG accumulation is in a region of apparent granulation/ scarring with SUV max = 4.3 (see CT image 100 of series 4). Layering gallstones are evident. There is abdominal aortic atherosclerosis without aneurysm. SKELETON No focal hypermetabolic activity to suggest skeletal metastasis. IMPRESSION: 1. Interval left nephrectomy and left adrenalectomy. 2. Interval development of a hypermetabolic focus in the posterior right hilum, concerning for metastatic disease. Two right lung nodules seen previously show no substantial change, but a new 5 mm left lower lobe nodule is evident on CT imaging. Dedicated diagnostic CT exam  of the chest with intravenous contrast recommended for better evaluation of the right hilum (which can then be directly compared to a diagnostic CT chest of 05/22/2016) and to establish baseline for the new left lower lobe pulmonary nodule. 3. Focus of FDG accumulation identified in the left para-aortic space, at the site of surgery. This may be related to granulation tissue from scarring, but close attention on follow-up is recommended. Electronically Signed   By: Misty Stanley M.D.   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 PET/CT is consistent with left kidney renal cell carcinoma.  Rt adrenal gland bx - showed clear cell RCC  s/p left radical nephrectomy and left adrenal gland resection on 08/01/2016 by Dr Alinda Money.  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  #3  Small pulmonary lesions -- PET scan suggestive of metastases from [place]  MRI brain shows no evidence of metastatic disease  Plan -Patient has started her Sutent 37.5 mg by mouth daily 2 weeks on one week off and reports no overt toxicities from this at this time. -Labs are stable. -Creatinine 1.9. Patient recommended to increase her oral water intake. --Recommended to stay physically active. -PET/CT results as noted above were discussed in details and images were reviewed.  #4  Microcytic hypochromic anemia  primarily appears to be related to anemia of chronic disease related to her newly diagnosed malignancy.  Thyroid function is within normal  limits. LDH within normal limits and suggests no evidence of hemolysis. Hemoglobin has improved progressively to 13.6 and has resolved since her surgery.  #5 moderate regarding calorie malnutrition Had lost 20 pounds in the last 1-2 months due to newly diagnosed malignancy. Has not gained back about 6 pounds since her last visit and has been eating well. Wt Readings from Last 3 Encounters:  10/04/16 130 lb 3.2 oz (59.1 kg)  09/05/16 124 lb 6.4 oz (56.4 kg)  08/16/16 126 lb (57.2 kg)    Plan Patient notes she is eating better and Started to gain back some weight . Weight is somewhat lower likely from some element of dehydration as well. -recommend to drink atleast 48-64 oz of fluids daily  #6 Hypothyroidism/Adrenal insufficiency/Diabetes -Continue F/u with Dr Buddy Duty   RTC in 4 weeks with Dr Irene Limbo (anytime from 7/17-7/19) with labs    All of the patients questions were answered with apparent satisfaction. The patient knows to call the clinic with any problems, questions or concerns.  I spent 20 minutes counseling the patient face to face. The total time spent in the appointment was 25 minutes and more than 50% was on counseling and direct patient cares.    Sullivan Lone MD [place] AAHIVMS [facility] Hematology/Oncology Physician [facility]  (Office):       [phone] (Work cell):  [phone] (Fax):           [phone]

## 2016-10-31 NOTE — Anesthesia Postprocedure Evaluation (Signed)
Anesthesia Post Note  Patient: Audrey [name]  Procedure(s) Performed: Procedure(s) (LRB): LAPAROSCOPIC  RADICAL NEPHRECTOMY/ REPAIR OF UMBILICAL HERNIA (Left)     Anesthesia Post Evaluation  Last Vitals:  Vitals:   08/03/16 0540 08/03/16 0730  BP: (!) 151/67 (!) 155/62  Pulse: 69 76  Resp: 20 18  Temp: 36.6 C 36.7 C    Last Pain:  Vitals:   08/03/16 0900  TempSrc:   PainSc: 2                  Jef Futch A.

## 2016-10-31 NOTE — Addendum Note (Signed)
Addendum  created 10/31/16 1613 by Teddrick Mallari, MD   Sign clinical note    

## 2016-11-05 ENCOUNTER — Telehealth: Payer: Self-pay

## 2016-11-08 ENCOUNTER — Other Ambulatory Visit (HOSPITAL_BASED_OUTPATIENT_CLINIC_OR_DEPARTMENT_OTHER): Payer: Medicare Other

## 2016-11-08 ENCOUNTER — Ambulatory Visit (HOSPITAL_BASED_OUTPATIENT_CLINIC_OR_DEPARTMENT_OTHER): Payer: Medicare Other | Admitting: Hematology

## 2016-11-08 ENCOUNTER — Telehealth: Payer: Self-pay | Admitting: Hematology

## 2016-11-08 VITALS — BP 186/80 | HR 61 | Temp 97.6°F | Resp 18 | Ht 59.0 in | Wt 132.5 lb

## 2016-11-08 DIAGNOSIS — E119 Type 2 diabetes mellitus without complications: Secondary | ICD-10-CM

## 2016-11-08 DIAGNOSIS — C642 Malignant neoplasm of left kidney, except renal pelvis: Secondary | ICD-10-CM

## 2016-11-08 DIAGNOSIS — E039 Hypothyroidism, unspecified: Secondary | ICD-10-CM

## 2016-11-08 DIAGNOSIS — C7972 Secondary malignant neoplasm of left adrenal gland: Secondary | ICD-10-CM | POA: Diagnosis not present

## 2016-11-08 DIAGNOSIS — C7971 Secondary malignant neoplasm of right adrenal gland: Secondary | ICD-10-CM | POA: Diagnosis not present

## 2016-11-08 DIAGNOSIS — D649 Anemia, unspecified: Secondary | ICD-10-CM | POA: Diagnosis not present

## 2016-11-08 DIAGNOSIS — E2749 Other adrenocortical insufficiency: Secondary | ICD-10-CM | POA: Diagnosis not present

## 2016-11-08 DIAGNOSIS — C649 Malignant neoplasm of unspecified kidney, except renal pelvis: Secondary | ICD-10-CM

## 2016-11-08 DIAGNOSIS — E440 Moderate protein-calorie malnutrition: Secondary | ICD-10-CM

## 2016-11-08 DIAGNOSIS — E871 Hypo-osmolality and hyponatremia: Secondary | ICD-10-CM | POA: Diagnosis not present

## 2016-11-08 LAB — CBC & DIFF AND RETIC
BASO%: 0.4 % (ref 0.0–2.0)
Basophils Absolute: 0 10*3/uL (ref 0.0–0.1)
EOS%: 2.9 % (ref 0.0–7.0)
Eosinophils Absolute: 0.1 10*3/uL (ref 0.0–0.5)
HCT: 38.5 % (ref 34.8–46.6)
HGB: 13.4 g/dL (ref 11.6–15.9)
Immature Retic Fract: 5.4 % (ref 1.60–10.00)
LYMPH%: 35.1 % (ref 14.0–49.7)
MCH: 30.5 pg (ref 25.1–34.0)
MCHC: 34.8 g/dL (ref 31.5–36.0)
MCV: 87.5 fL (ref 79.5–101.0)
MONO#: 0.5 10*3/uL (ref 0.1–0.9)
MONO%: 10.1 % (ref 0.0–14.0)
NEUT#: 2.5 10*3/uL (ref 1.5–6.5)
NEUT%: 51.5 % (ref 38.4–76.8)
Platelets: 202 10*3/uL (ref 145–400)
RBC: 4.4 10*6/uL (ref 3.70–5.45)
RDW: 15.9 % — ABNORMAL HIGH (ref 11.2–14.5)
Retic %: 1.04 % (ref 0.70–2.10)
Retic Ct Abs: 45.76 10*3/uL (ref 33.70–90.70)
WBC: 4.8 10*3/uL (ref 3.9–10.3)
lymph#: 1.7 10*3/uL (ref 0.9–3.3)

## 2016-11-08 LAB — COMPREHENSIVE METABOLIC PANEL
ALT: 24 U/L (ref 0–55)
AST: 25 U/L (ref 5–34)
Albumin: 4.3 g/dL (ref 3.5–5.0)
Alkaline Phosphatase: 107 U/L (ref 40–150)
Anion Gap: 12 mEq/L — ABNORMAL HIGH (ref 3–11)
BUN: 33 mg/dL — ABNORMAL HIGH (ref 7.0–26.0)
CO2: 21 mEq/L — ABNORMAL LOW (ref 22–29)
Calcium: 9.6 mg/dL (ref 8.4–10.4)
Chloride: 91 mEq/L — ABNORMAL LOW (ref 98–109)
Creatinine: 1.9 mg/dL — ABNORMAL HIGH (ref 0.6–1.1)
EGFR: 25 mL/min/{1.73_m2} — ABNORMAL LOW (ref >90)
Glucose: 77 mg/dl (ref 70–140)
Potassium: 4.5 mEq/L (ref 3.5–5.1)
Sodium: 124 mEq/L — ABNORMAL LOW (ref 136–145)
Total Bilirubin: 0.62 mg/dL (ref 0.20–1.20)
Total Protein: 7.6 g/dL (ref 6.4–8.3)

## 2016-11-08 LAB — UA PROTEIN, DIPSTICK - CHCC: Protein, ur: NEGATIVE mg/dL

## 2016-11-08 MED ORDER — ONDANSETRON HCL 4 MG PO TABS: 4.0000 mg | ORAL_TABLET | Freq: Three times a day (TID) | ORAL | 1 refills | Status: DC | PRN

## 2016-11-08 MED ORDER — PROCHLORPERAZINE MALEATE 10 MG PO TABS: 10.0000 mg | ORAL_TABLET | Freq: Four times a day (QID) | ORAL | 0 refills | Status: DC | PRN

## 2016-11-08 MED ORDER — HYDROCORTISONE 10 MG PO TABS: ORAL_TABLET | ORAL | 1 refills | Status: DC

## 2016-11-08 MED ORDER — SUNITINIB MALATE 37.5 MG PO CAPS: ORAL_CAPSULE | ORAL | 1 refills | Status: DC

## 2016-11-08 NOTE — Telephone Encounter (Signed)
Gave patient avs report and appointments for August. Patient will have lab draw with pcp next week - patient will arrange (walk in per patient). Per patient changed 8/10 lab/fu to 9:30 am.

## 2016-11-10 NOTE — Progress Notes (Signed)
Marland Kitchen    HEMATOLOGY/ONCOLOGY CLINIC NOTE  Date of Service: ..11/08/2016  PCP: Kristine Linea MD ([place]) Endocrinolgy - Dr Buddy Duty GI- Dr Bubba Camp  CHIEF COMPLAINTS/PURPOSE OF CONSULTATION:  Renal cell carcinoma  Anemia  HISTORY OF PRESENTING ILLNESS:   Audrey [name] is a wonderful 76 y.o. female who has been referred to [name] by Dr Kristine Linea MD  for evaluation and management of concern for newly diagnosed Renal cell carcinoma.  Patient has a history of hypertension, dyslipidemia, diabetes then presented to her primary care physician with hematuria and a urinary tract infection on 03/13/2016. Patient notes she was noted to have an Escherichia coli urinary tract infection and was treated with ciprofloxacin. She was noted to have significant anemia with a positive fecal occult blood test and subsequently had an EGD and colonoscopy with Dr. Bubba Camp in January 2018 and December 2017 respectively. No overt evidence of bleeding. Was noted to have a hiatal hernia with esophagitis.  Patient notes that she was having lower abdominal and left flank pain and progressive weight loss of 20 pounds along with anemia with a hemoglobin of 8.4 .she also reports that she was having chills every night and was taking Aleve. Notes aggressive shortness of breath with increased dyspnea on exertion and fatigue. Increased nausea with decreased by mouth fluid intake and fluid intake.   Patient had a CT of the chest on 05/22/2016 due to her low left rib/flank plain. This showed indeterminate bilateral pulmonary nodules with the largest one in the right middle lobe. Indeterminate bilateral adrenal nodules were noted. 1.9 cm left adrenal nodule and 2.4 cm right adrenal nodule.  Patient subsequently had an MRI of the abdomen with and without contrast to evaluate adrenal nodules. This was done on 06/04/2016 and showed a large heterogenous enhancing mass involving the left interpolar and lower pole  of the kidney measuring 8.8 cm x 6.9 cm x 7.6 cm. Left renal vein was patent. No definitive evidence of invasion of the posterior oblique musculature. No ascites. No upper abdominal lymphadenopathy. Heterogenous enhancing bilateral adrenal nodules were noted measuring 2.2 cm on the right and 1.5 cm on the left.  Patient was referred to Dr. Hulen Shouts at [facility] and has an appointment on 06/27/2016 for urology evaluation.  She was referred to [name] for further evaluation of her anemia. Currently notes no active hematuria or rectal bleeding. No melena.  INTERVAL HISTORY  Patient is here for her scheduled follow-up for her renal cell carcinoma after completion of 1 cycle of sutent. She notes some grade 1 diarrhea that is controlled with minimal doses of Imodium which he hasn't been taking optimally. Some mild nausea. Given refills on Zofran and Compazine. Has only been on hydrocortisone 5 mg daily.  She will noted to have hyponatremia 124 with no altered mental status or overt headaches . Minimal nausea resolved with medications. Note somewhat blurry vision at times .   MEDICAL HISTORY:    Hypertension Dyslipidemia Osteoarthritis Ex-smoker Coronary artery disease Thyroid disorder-was apparently on levothyroxine 25 g daily which has subsequently been discontinued . 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 Earley Brooke Colonoscopy 03/2016 Dr. Earley Brooke  SOCIAL HISTORY: Social History   Social History  . Marital status: Married    Spouse name: N/A  . Number of children: N/A  . Years of education: N/A   Occupational History  .  Not on file.   Social History Main Topics  . Smoking status: Former Smoker    Years: 8.00    Quit date: 06/18/1964  . Smokeless tobacco: Never Used  . Alcohol use No  . Drug use: No  . Sexual activity: Not on file    Other Topics Concern  . Not on file   Social History Narrative  . No narrative on file   Patient lives in [place] Works as a Solicitor part-time Ex-smoker quit long time ago In 1961 . Smoked 2 packs per day for 8 years prior to that .  or a lot of stress since her daughter is also getting treated for stage IV uterine cancer.  FAMILY HISTORY:  Mother deceased Father with asthma and emphysema died at 51 years with the MI. Brother deceased of heart disease   ALLERGIES:  is allergic to adhesive [tape]; nsaids; and statins.  MEDICATIONS:  Current Outpatient Prescriptions  Medication Sig Dispense Refill  . acetaminophen (TYLENOL) 500 MG tablet Take 1,000 mg by mouth 2 (two) times daily.     Marland Kitchen atorvastatin (LIPITOR) 40 MG tablet Take 40 mg by mouth daily at 6 PM.     . clobetasol ointment (TEMOVATE) 1.06 % Apply 1 application topically 2 (two) times daily.    Marland Kitchen glimepiride (AMARYL) 1 MG tablet Take 1 mg by mouth every evening.     . hydrocortisone (CORTEF) 10 MG tablet 10mg  po with breakfast and 5 mg po with lunch 90 tablet 1  . levothyroxine (SYNTHROID, LEVOTHROID) 25 MCG tablet Take 25 mcg by mouth daily before breakfast.    . lisinopril (PRINIVIL,ZESTRIL) 40 MG tablet Take 40 mg by mouth every evening.     . ondansetron (ZOFRAN) 4 MG tablet Take 1 tablet (4 mg total) by mouth every 8 (eight) hours as needed for nausea. 30 tablet 1  . prochlorperazine (COMPAZINE) 10 MG tablet Take 1 tablet (10 mg total) by mouth every 6 (six) hours as needed for nausea or vomiting. 30 tablet 0  . SUNItinib (SUTENT) 37.5 MG capsule 37.5 mg po daily 2 weeks on 1 week off 28 capsule 1   No current facility-administered medications for this visit.     REVIEW OF SYSTEMS:    10 Point review of Systems was done is negative except as noted above.  PHYSICAL EXAMINATION: ECOG PERFORMANCE STATUS: 1 - Symptomatic but completely ambulatory  . Vitals:   11/08/16 1001  BP: (!) 186/80   Pulse: 61  Resp: 18  Temp: 97.6 F (36.4 C)   Filed Weights   11/08/16 1001  Weight: 132 lb 8 oz (60.1 kg)   .Body mass index is 26.76 kg/m.  GENERAL:alert, in no acute distress and comfortable SKIN: skin color, texture, turgor are normal, no rashes or significant lesions EYES: normal, conjunctiva are pink and non-injected, sclera clear OROPHARYNX:no exudate, no erythema and lips, buccal mucosa, and tongue normal  NECK: supple, no JVD, thyroid normal size, non-tender, without nodularity LYMPH:  no palpable lymphadenopathy in the cervical, axillary or inguinal LUNGS: clear to auscultation with normal respiratory effort HEART: regular rate & rhythm,  no murmurs and no lower extremity edema ABDOMEN: abdomen soft, non-tender, normoactive bowel sounds  Musculoskeletal: no cyanosis of digits and no clubbing  PSYCH: alert & oriented x 3 with fluent speech NEURO: no focal motor/sensory deficits  LABORATORY DATA:  I have reviewed the data as listed  . CBC Latest Ref Rng & Units 11/08/2016 10/04/2016 09/05/2016  WBC 3.9 -  10.3 10e3/uL 4.8 5.0 8.0  Hemoglobin 11.6 - 15.9 g/dL 13.4 13.6 11.8  Hematocrit 34.8 - 46.6 % 38.5 39.8 37.1  Platelets 145 - 400 10e3/uL 202 224 363   . CBC    Component Value Date/Time   WBC 4.8 11/08/2016 0923   WBC 10.7 (H) 07/29/2016 1526   RBC 4.40 11/08/2016 0923   RBC 3.53 (L) 07/29/2016 1526   HGB 13.4 11/08/2016 0923   HCT 38.5 11/08/2016 0923   PLT 202 11/08/2016 0923   MCV 87.5 11/08/2016 0923   MCH 30.5 11/08/2016 0923   MCH 26.1 07/29/2016 1526   MCHC 34.8 11/08/2016 0923   MCHC 31.6 07/29/2016 1526   RDW 15.9 (H) 11/08/2016 0923   LYMPHSABS 1.7 11/08/2016 0923   MONOABS 0.5 11/08/2016 0923   EOSABS 0.1 11/08/2016 0923   BASOSABS 0.0 11/08/2016 0923     . CMP Latest Ref Rng & Units 11/08/2016 10/04/2016 09/05/2016  Glucose 70 - 140 mg/dl 77 142(H) 117  BUN 7.0 - 26.0 mg/dL 33.0(H) 26.9(H) 45.5(H)  Creatinine 0.6 - 1.1 mg/dL 1.9(H)  1.9(H) 2.1(H)  Sodium 136 - 145 mEq/L 124(L) 134(L) 140  Potassium 3.5 - 5.1 mEq/L 4.5 4.7 5.1  Chloride 101 - 111 mmol/L - - -  CO2 22 - 29 mEq/L 21(L) 23 24  Calcium 8.4 - 10.4 mg/dL 9.6 9.1 10.7(H)  Total Protein 6.4 - 8.3 g/dL 7.6 6.9 7.4  Total Bilirubin 0.20 - 1.20 mg/dL 0.62 0.43 0.33  Alkaline Phos 40 - 150 U/L 107 89 80  AST 5 - 34 U/L 25 22 20   ALT 0 - 55 U/L 24 19 29    B12 level 299 (OSH)-->455        RADIOGRAPHIC STUDIES: I have personally reviewed the radiological images as listed and agreed with the findings in the report. No results found.  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 PET/CT is consistent with left kidney renal cell carcinoma.  Rt adrenal gland bx - showed clear cell RCC  S/p CYtoreductive left radical nephrectomy and left adrenal gland resection on 08/01/2016 by Dr Alinda Money. 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 Will need to rule out hypothyroidism. Primarily solute free fluid intake. HCTZ use Plan -continue Sutent at current dose -increased Hydrocortisone to 10mg  in AM and 5AM with lunch - f/u with Dr Buddy Duty -increased po fluid hydration with electrolyte rich fluids -discontinued HCTZ -control diarrhea with prn imodium -given prescription for zofran and compazine   patient was given a prescription to get repeat labs CMP, magnesium, TSH, free T4, serium osmolaility, urine osmolality, urine sodium  at a lab near her home/with PCP and fax [name] the results. -recommend to seek immediate attention for AMS, headaches and increasing weakness.  #4  Microcytic hypochromic anemia  primarily appears to be related to anemia of chronic disease related to her newly diagnosed malignancy.  Thyroid function is within normal limits. LDH within normal limits and suggests no evidence of hemolysis.  Hemoglobin has improved progressively to 13.6 and has resolved since her surgery.  #5 moderate regarding calorie malnutrition Had lost 20 pounds in the last 1-2 months due to newly diagnosed malignancy. Has not gained back about 8 pounds since 2 months and is eating much better. Wt Readings from Last 3 Encounters:  11/08/16 132 lb 8 oz (60.1 kg)  10/04/16 130 lb 3.2 oz (59.1 kg)  09/05/16 124 lb 6.4 oz (56.4 kg)    Plan Continue healthy po intake/diabetic diet -recommend to drink atleast 48-64 oz of fluids daily  #6 Hypothyroidism/Adrenal insufficiency/Diabetes -Continue F/u with Dr Jodelle Green with PCP in 1 week RTC with Dr Irene Limbo in 3 weeks with labs   All of the patients questions were answered with apparent satisfaction. The patient knows to call the clinic with any problems, questions or concerns.  I spent 30 minutes counseling the patient face to face. The total time spent in the appointment was 40 minutes and more than 50% was on counseling and direct patient cares.    Sullivan Lone MD [place] AAHIVMS [facility] Hematology/Oncology Physician [facility]  (Office):       [phone] (Work cell):  [phone] (Fax):           [phone]

## 2016-11-15 ENCOUNTER — Other Ambulatory Visit: Payer: Self-pay

## 2016-11-15 ENCOUNTER — Telehealth: Payer: Self-pay

## 2016-11-15 DIAGNOSIS — C642 Malignant neoplasm of left kidney, except renal pelvis: Secondary | ICD-10-CM

## 2016-11-15 NOTE — Telephone Encounter (Signed)
Sutent prescription sent to [facility]. Confirmed fax receipt.

## 2016-11-15 NOTE — Telephone Encounter (Signed)
Returned call to patient regarding diarrhea. Pt symptoms resolving slowly. Reminded pt that she can take 4mg  imodium in the morning and 2mg  following each occurrence of diarrhea up to 16mg  a day. Pt said that she will try this. If symptoms continue or worsen, pt to call us and [name] okay to order lomotil 1 tablet bid as needed. Pt verbalized understanding, and to call if symptoms continue or worsen. Pt and MD believe Sutent likely cause of diarrhea. Pt is on her second day off of Sutent and will monitor progression of diarrhea.

## 2016-11-29 ENCOUNTER — Other Ambulatory Visit (HOSPITAL_BASED_OUTPATIENT_CLINIC_OR_DEPARTMENT_OTHER): Payer: Medicare Other

## 2016-11-29 ENCOUNTER — Ambulatory Visit (HOSPITAL_BASED_OUTPATIENT_CLINIC_OR_DEPARTMENT_OTHER): Payer: Medicare Other | Admitting: Hematology

## 2016-11-29 ENCOUNTER — Telehealth: Payer: Self-pay | Admitting: Hematology

## 2016-11-29 VITALS — BP 168/79 | HR 76 | Temp 98.3°F | Resp 18 | Ht 59.0 in | Wt 131.7 lb

## 2016-11-29 DIAGNOSIS — C7971 Secondary malignant neoplasm of right adrenal gland: Secondary | ICD-10-CM

## 2016-11-29 DIAGNOSIS — R197 Diarrhea, unspecified: Secondary | ICD-10-CM | POA: Diagnosis not present

## 2016-11-29 DIAGNOSIS — J984 Other disorders of lung: Secondary | ICD-10-CM

## 2016-11-29 DIAGNOSIS — E871 Hypo-osmolality and hyponatremia: Secondary | ICD-10-CM

## 2016-11-29 DIAGNOSIS — C642 Malignant neoplasm of left kidney, except renal pelvis: Secondary | ICD-10-CM

## 2016-11-29 DIAGNOSIS — I10 Essential (primary) hypertension: Secondary | ICD-10-CM | POA: Diagnosis not present

## 2016-11-29 DIAGNOSIS — C7800 Secondary malignant neoplasm of unspecified lung: Secondary | ICD-10-CM

## 2016-11-29 DIAGNOSIS — C7972 Secondary malignant neoplasm of left adrenal gland: Secondary | ICD-10-CM

## 2016-11-29 DIAGNOSIS — E039 Hypothyroidism, unspecified: Secondary | ICD-10-CM

## 2016-11-29 DIAGNOSIS — E440 Moderate protein-calorie malnutrition: Secondary | ICD-10-CM | POA: Diagnosis not present

## 2016-11-29 DIAGNOSIS — E2749 Other adrenocortical insufficiency: Secondary | ICD-10-CM | POA: Diagnosis not present

## 2016-11-29 DIAGNOSIS — E119 Type 2 diabetes mellitus without complications: Secondary | ICD-10-CM | POA: Diagnosis not present

## 2016-11-29 DIAGNOSIS — D649 Anemia, unspecified: Secondary | ICD-10-CM

## 2016-11-29 DIAGNOSIS — R7989 Other specified abnormal findings of blood chemistry: Secondary | ICD-10-CM

## 2016-11-29 LAB — CBC & DIFF AND RETIC
BASO%: 0.3 % (ref 0.0–2.0)
Basophils Absolute: 0 10*3/uL (ref 0.0–0.1)
EOS%: 1.7 % (ref 0.0–7.0)
Eosinophils Absolute: 0.1 10*3/uL (ref 0.0–0.5)
HCT: 37 % (ref 34.8–46.6)
HGB: 12.5 g/dL (ref 11.6–15.9)
Immature Retic Fract: 9.1 % (ref 1.60–10.00)
LYMPH%: 26.9 % (ref 14.0–49.7)
MCH: 31.7 pg (ref 25.1–34.0)
MCHC: 33.8 g/dL (ref 31.5–36.0)
MCV: 93.9 fL (ref 79.5–101.0)
MONO#: 0.3 10*3/uL (ref 0.1–0.9)
MONO%: 9.5 % (ref 0.0–14.0)
NEUT#: 2.1 10*3/uL (ref 1.5–6.5)
NEUT%: 61.6 % (ref 38.4–76.8)
Platelets: 223 10*3/uL (ref 145–400)
RBC: 3.94 10*6/uL (ref 3.70–5.45)
RDW: 17 % — ABNORMAL HIGH (ref 11.2–14.5)
Retic %: 1.37 % (ref 0.70–2.10)
Retic Ct Abs: 53.98 10*3/uL (ref 33.70–90.70)
WBC: 3.5 10*3/uL — ABNORMAL LOW (ref 3.9–10.3)
lymph#: 0.9 10*3/uL (ref 0.9–3.3)

## 2016-11-29 LAB — COMPREHENSIVE METABOLIC PANEL
ALT: 28 U/L (ref 0–55)
AST: 30 U/L (ref 5–34)
Albumin: 4.1 g/dL (ref 3.5–5.0)
Alkaline Phosphatase: 78 U/L (ref 40–150)
Anion Gap: 11 mEq/L (ref 3–11)
BUN: 27.4 mg/dL — ABNORMAL HIGH (ref 7.0–26.0)
CO2: 22 mEq/L (ref 22–29)
Calcium: 9.9 mg/dL (ref 8.4–10.4)
Chloride: 100 mEq/L (ref 98–109)
Creatinine: 1.9 mg/dL — ABNORMAL HIGH (ref 0.6–1.1)
EGFR: 25 mL/min/{1.73_m2} — ABNORMAL LOW (ref >90)
Glucose: 106 mg/dl (ref 70–140)
Potassium: 4.4 mEq/L (ref 3.5–5.1)
Sodium: 133 mEq/L — ABNORMAL LOW (ref 136–145)
Total Bilirubin: 0.71 mg/dL (ref 0.20–1.20)
Total Protein: 7.8 g/dL (ref 6.4–8.3)

## 2016-11-29 LAB — MAGNESIUM: Magnesium: 2.4 mg/dl (ref 1.5–2.5)

## 2016-11-29 LAB — TSH: TSH: 2.693 m(IU)/L (ref 0.308–3.960)

## 2016-11-29 MED ORDER — HYDROCORTISONE 10 MG PO TABS: ORAL_TABLET | ORAL | 1 refills | Status: DC

## 2016-11-29 NOTE — Progress Notes (Signed)
Marland Kitchen    HEMATOLOGY/ONCOLOGY CLINIC NOTE  Date of Service: ..11/29/2016  PCP: Kristine Linea MD ([place]) Endocrinolgy - Dr Buddy Duty GI- Dr Bubba Camp  CHIEF COMPLAINTS/PURPOSE OF CONSULTATION:  Renal cell carcinoma  Anemia  HISTORY OF PRESENTING ILLNESS:   Audrey [name] is a wonderful 76 y.o. female who has been referred to [name] by Dr Kristine Linea MD  for evaluation and management of concern for newly diagnosed Renal cell carcinoma.  Patient has a history of hypertension, dyslipidemia, diabetes then presented to her primary care physician with hematuria and a urinary tract infection on 03/13/2016. Patient notes she was noted to have an Escherichia coli urinary tract infection and was treated with ciprofloxacin. She was noted to have significant anemia with a positive fecal occult blood test and subsequently had an EGD and colonoscopy with Dr. Bubba Camp in January 2018 and December 2017 respectively. No overt evidence of bleeding. Was noted to have a hiatal hernia with esophagitis.  Patient notes that she was having lower abdominal and left flank pain and progressive weight loss of 20 pounds along with anemia with a hemoglobin of 8.4 .she also reports that she was having chills every night and was taking Aleve. Notes aggressive shortness of breath with increased dyspnea on exertion and fatigue. Increased nausea with decreased by mouth fluid intake and fluid intake.   Patient had a CT of the chest on 05/22/2016 due to her low left rib/flank plain. This showed indeterminate bilateral pulmonary nodules with the largest one in the right middle lobe. Indeterminate bilateral adrenal nodules were noted. 1.9 cm left adrenal nodule and 2.4 cm right adrenal nodule.  Patient subsequently had an MRI of the abdomen with and without contrast to evaluate adrenal nodules. This was done on 06/04/2016 and showed a large heterogenous enhancing mass involving the left interpolar and lower pole  of the kidney measuring 8.8 cm x 6.9 cm x 7.6 cm. Left renal vein was patent. No definitive evidence of invasion of the posterior oblique musculature. No ascites. No upper abdominal lymphadenopathy. Heterogenous enhancing bilateral adrenal nodules were noted measuring 2.2 cm on the right and 1.5 cm on the left.  Patient was referred to [facility] Hulen [facility]outs at [facility] and has an appointment on 06/27/2016 for urology evaluation.  She was referred to [name] for further evaluation of her anemia. Currently notes no active hematuria or rectal bleeding. No melena.  INTERVAL HISTORY  Patient is here for her scheduled follow-up for her renal cell carcinoma. She has been taking her Sutent regularly without any acute issues. Grade 1 diarrhea well controlled with when necessary Imodium. Eating better. Sodium levels are improved up to 134. She continues to take her hydrocortisone at the increased dose of 10 in the AM and 5 with lunch. She restarted herself on the hydrochlorothiazide about 2 weeks ago.  MEDICAL HISTORY:    Hypertension Dyslipidemia Osteoarthritis Ex-smoker Coronary artery disease Thyroid disorder-was apparently on levothyroxine 25 g daily which has subsequently been discontinued . 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 Earley Brooke Colonoscopy 03/2016 Dr. Earley Brooke  SOCIAL HISTORY: Social History   Social History  . Marital status: Married    Spouse name: N/A  . Number of children: N/A  . Years of education: N/A   Occupational History  . Not on file.   Social History Main Topics  . Smoking status: Former Smoker  Years: 8.00    Quit date: 06/18/1964  . Smokeless tobacco: Never Used  . Alcohol use No  . Drug use: No  . Sexual activity: Not on file   Other Topics Concern  . Not on file   Social History Narrative  . No narrative on  file   Patient lives in [place] Works as a Solicitor part-time Ex-smoker quit long time ago In 1961 . Smoked 2 packs per day for 8 years prior to that .  or a lot of stress since her daughter is also getting treated for stage IV uterine cancer.  FAMILY HISTORY:  Mother deceased Father with asthma and emphysema died at 10 years with the MI. Brother deceased of heart disease   ALLERGIES:  is allergic to adhesive [tape]; nsaids; and statins.  MEDICATIONS:  Current Outpatient Prescriptions  Medication Sig Dispense Refill  . acetaminophen (TYLENOL) 500 MG tablet Take 1,000 mg by mouth 2 (two) times daily.     Marland Kitchen atorvastatin (LIPITOR) 40 MG tablet Take 40 mg by mouth daily at 6 PM.     . clobetasol ointment (TEMOVATE) 4.26 % Apply 1 application topically 2 (two) times daily.    Marland Kitchen glimepiride (AMARYL) 1 MG tablet Take 1 mg by mouth every evening.     . hydrocortisone (CORTEF) 10 MG tablet 10mg  po with breakfast and 5 mg po with lunch 90 tablet 1  . levothyroxine (SYNTHROID, LEVOTHROID) 25 MCG tablet Take 25 mcg by mouth daily before breakfast.    . lisinopril (PRINIVIL,ZESTRIL) 40 MG tablet Take 40 mg by mouth every evening.     . ondansetron (ZOFRAN) 4 MG tablet Take 1 tablet (4 mg total) by mouth every 8 (eight) hours as needed for nausea. 30 tablet 1  . prochlorperazine (COMPAZINE) 10 MG tablet Take 1 tablet (10 mg total) by mouth every 6 (six) hours as needed for nausea or vomiting. 30 tablet 0  . SUNItinib (SUTENT) 37.5 MG capsule 37.5 mg po daily 2 weeks on 1 week off 28 capsule 1   No current facility-administered medications for this visit.     REVIEW OF SYSTEMS:    10 Point review of Systems was done is negative except as noted above.  PHYSICAL EXAMINATION: ECOG PERFORMANCE STATUS: 1 - Symptomatic but completely ambulatory  . Vitals:   11/29/16 1011  BP: (!) 168/79  Pulse: 76  Resp: 18  Temp: 98.3 F (36.8 C)  SpO2: 100%   Filed Weights   11/29/16  1011  Weight: 131 lb 11.2 oz (59.7 kg)   .Body mass index is 26.6 kg/m.  GENERAL:alert, in no acute distress and comfortable SKIN: skin color, texture, turgor are normal, no rashes or significant lesions EYES: normal, conjunctiva are pink and non-injected, sclera clear OROPHARYNX:no exudate, no erythema and lips, buccal mucosa, and tongue normal  NECK: supple, no JVD, thyroid normal size, non-tender, without nodularity LYMPH:  no palpable lymphadenopathy in the cervical, axillary or inguinal LUNGS: clear to auscultation with normal respiratory effort HEART: regular rate & rhythm,  no murmurs and no lower extremity edema ABDOMEN: abdomen soft, non-tender, normoactive bowel sounds  Musculoskeletal: no cyanosis of digits and no clubbing  PSYCH: alert & oriented x 3 with fluent speech NEURO: no focal motor/sensory deficits  LABORATORY DATA:  I have reviewed the data as listed  . CBC Latest Ref Rng & Units 11/29/2016 11/08/2016 10/04/2016  WBC 3.9 - 10.3 10e3/uL 3.5(L) 4.8 5.0  Hemoglobin 11.6 - 15.9 g/dL 12.5 13.4 13.6  Hematocrit 34.8 - 46.6 % 37.0 38.5 39.8  Platelets 145 - 400 10e3/uL 223 202 224   . CBC    Component Value Date/Time   WBC 3.5 (L) 11/29/2016 0940   WBC 10.7 (H) 07/29/2016 1526   RBC 3.94 11/29/2016 0940   RBC 3.53 (L) 07/29/2016 1526   HGB 12.5 11/29/2016 0940   HCT 37.0 11/29/2016 0940   PLT 223 11/29/2016 0940   MCV 93.9 11/29/2016 0940   MCH 31.7 11/29/2016 0940   MCH 26.1 07/29/2016 1526   MCHC 33.8 11/29/2016 0940   MCHC 31.6 07/29/2016 1526   RDW 17.0 (H) 11/29/2016 0940   LYMPHSABS 0.9 11/29/2016 0940   MONOABS 0.3 11/29/2016 0940   EOSABS 0.1 11/29/2016 0940   BASOSABS 0.0 11/29/2016 0940     . CMP Latest Ref Rng & Units 11/29/2016 11/08/2016 10/04/2016  Glucose 70 - 140 mg/dl 106 77 142(H)  BUN 7.0 - 26.0 mg/dL 27.4(H) 33.0(H) 26.9(H)  Creatinine 0.6 - 1.1 mg/dL 1.9(H) 1.9(H) 1.9(H)  Sodium 136 - 145 mEq/L 133(L) 124(L) 134(L)  Potassium  3.5 - 5.1 mEq/L 4.4 4.5 4.7  Chloride 101 - 111 mmol/L - - -  CO2 22 - 29 mEq/L 22 21(L) 23  Calcium 8.4 - 10.4 mg/dL 9.9 9.6 9.1  Total Protein 6.4 - 8.3 g/dL 7.8 7.6 6.9  Total Bilirubin 0.20 - 1.20 mg/dL 0.71 0.62 0.43  Alkaline Phos 40 - 150 U/L 78 107 89  AST 5 - 34 U/L 30 25 22   ALT 0 - 55 U/L 28 24 19    B12 level 299 (OSH)-->455        RADIOGRAPHIC STUDIES: I have personally reviewed the radiological images as listed and agreed with the findings in the report. No results found.  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 PET/CT is consistent with left kidney renal cell carcinoma.  Rt adrenal gland bx - showed clear cell RCC  S/p CYtoreductive left radical nephrectomy and left adrenal gland resection on 08/01/2016 by Dr Alinda Money. 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 Will need to rule out hypothyroidism. Primarily solute free fluid intake. HCTZ use Sodium levels are up from 124 to near normal at 134 with appropriate adjustment of her hydrocortisone dose Plan -continue Sutent at current dose -continue Hydrocortisone to 10mg  in AM and 5AM with lunch - f/u with Dr Buddy Duty -increased po fluid hydration with electrolyte rich fluids -discontinued HCTZ - but patient restarted it on her own 2 weeks ago. Sodium levels are okay we will monitor but if she becomes hyponatremic again might need to  switch to Lasix. -We'll follow up on pending thyroid function tests from today. -control diarrhea with prn imodium -Will get a repeat PET CT scan in 5 weeks to reevaluate response to Sutent. -Return to clinic with [name] in 6 weeks with repeat labs.  #4  Microcytic hypochromic anemia  primarily appears to be related to  anemia of chronic disease related to her newly diagnosed malignancy.  Thyroid function is within normal limits. LDH within normal limits and suggests no evidence of hemolysis. Hemoglobin has resolved since surgery. hgb stable at 12.5  #5 Moderate regarding calorie malnutrition Weight has stabilized Wt Readings from Last 3 Encounters:  11/29/16 131 lb 11.2 oz (59.7 kg)  11/08/16 132 lb 8 oz (60.1 kg)  10/04/16 130 lb 3.2 oz (59.1 kg)    Plan Continue healthy po intake/diabetic diet -recommend to drink atleast 48-64 oz of fluids daily  #6 Hypothyroidism/Adrenal insufficiency/Diabetes -Continue F/u with Dr Buddy Duty  #7 HTN - ?control. Patient dense to be anxious and has higher blood pressures in the clinic. She can have increased blood pressure from Sutent as well. Plan -Patient was recommended to buy a blood pressure machine and monitor her blood pressure at home prior to walking or drinking tea or coffee -She will chart all these numbers and follow-up with [name] or primary care physician to optimize her hypertension management.  Continue follow-up with primary care physician and Dr Buddy Duty.  PET/CT in 5 weeks RTC with Dr Irene Limbo in 6 weeks with  labs  All of the patients questions were answered with apparent satisfaction. The patient knows to call the clinic with any problems, questions or concerns.  I spent 25 minutes counseling the patient face to face. The total time spent in the appointment was 40 minutes and more than 50% was on counseling and direct patient cares.    Audrey Lone MD [place] AAHIVMS [facility] Hematology/Oncology Physician [facility]  (Office):       [phone] (Work cell):  [phone] (Fax):           [phone]

## 2016-11-29 NOTE — Telephone Encounter (Signed)
Gave patient avs and calendar.   °

## 2016-11-30 LAB — T3 UPTAKE
Free Thyroxine Index: 2.1 (ref 1.2–4.9)
T3 Uptake Ratio: 26 % (ref 24–39)

## 2016-11-30 LAB — T4, FREE: T4,Free(Direct): 1.39 ng/dL (ref 0.82–1.77)

## 2016-11-30 LAB — T4: Thyroxine (T4): 8.1 ug/dL (ref 4.5–12.0)

## 2017-01-01 ENCOUNTER — Telehealth: Payer: Self-pay

## 2017-01-01 NOTE — Telephone Encounter (Signed)
Fluid pill originally prescribed by cardiologist per pt. Will get in touch with MD tomorrow to get medication ordered. Pt experiencing swelling of ankles and legs that is painful. Medication to be managed by PCP or cardiologist per [name].

## 2017-01-03 ENCOUNTER — Encounter (HOSPITAL_COMMUNITY)
Admission: RE | Admit: 2017-01-03 | Discharge: 2017-01-03 | Disposition: A | Discharge status: Home / Self Care | Payer: Medicare Other | Source: Ambulatory Visit | Attending: Hematology | Admitting: Hematology

## 2017-01-03 DIAGNOSIS — C642 Malignant neoplasm of left kidney, except renal pelvis: Secondary | ICD-10-CM | POA: Diagnosis not present

## 2017-01-03 DIAGNOSIS — C7800 Secondary malignant neoplasm of unspecified lung: Secondary | ICD-10-CM | POA: Diagnosis present

## 2017-01-03 LAB — GLUCOSE, CAPILLARY: Glucose-Capillary: 123 mg/dL — ABNORMAL HIGH (ref 65–99)

## 2017-01-03 MED ORDER — FLUDEOXYGLUCOSE F - 18 (FDG) INJECTION
6.4000 | Freq: Once | INTRAVENOUS | Status: DC | PRN
Start: 2017-01-03 — End: 2017-01-09

## 2017-01-08 ENCOUNTER — Telehealth: Payer: Self-pay | Admitting: *Deleted

## 2017-01-08 NOTE — Telephone Encounter (Signed)
"  I received a call from this number.  What is call in reference to?  I assume it is about my next appointment but no message left.  I do not have a land line."  No documented calls in this EMR at this time.  Reviewed 01-10-2017 appointment information.  Instructed to arrive before 8:30 to register.  No further questions.

## 2017-01-09 NOTE — Progress Notes (Signed)
Marland Kitchen    HEMATOLOGY/ONCOLOGY CLINIC NOTE  Date of Service: 01/10/17  PCP: Kristine Linea MD ([place]) Endocrinolgy - Dr Buddy Duty GI- Dr Bubba Camp  CHIEF COMPLAINTS/PURPOSE OF CONSULTATION:  Renal cell carcinoma  Anemia  HISTORY OF PRESENTING ILLNESS:   Audrey [name] is a wonderful 76 y.o. female who has been referred to [name] by Dr Kristine Linea MD  for evaluation and management of concern for newly diagnosed Renal cell carcinoma.  Patient has a history of hypertension, dyslipidemia, diabetes then presented to her primary care physician with hematuria and a urinary tract infection on 03/13/2016. Patient notes she was noted to have an Escherichia coli urinary tract infection and was treated with ciprofloxacin. She was noted to have significant anemia with a positive fecal occult blood test and subsequently had an EGD and colonoscopy with Dr. Bubba Camp in January 2018 and December 2017 respectively. No overt evidence of bleeding. Was noted to have a hiatal hernia with esophagitis.  Patient notes that she was having lower abdominal and left flank pain and progressive weight loss of 20 pounds along with anemia with a hemoglobin of 8.4 .she also reports that she was having chills every night and was taking Aleve. Notes aggressive shortness of breath with increased dyspnea on exertion and fatigue. Increased nausea with decreased by mouth fluid intake and fluid intake.   Patient had a CT of the chest on 05/22/2016 due to her low left rib/flank plain. This showed indeterminate bilateral pulmonary nodules with the largest one in the right middle lobe. Indeterminate bilateral adrenal nodules were noted. 1.9 cm left adrenal nodule and 2.4 cm right adrenal nodule.  Patient subsequently had an MRI of the abdomen with and without contrast to evaluate adrenal nodules. This was done on 06/04/2016 and showed a large heterogenous enhancing mass involving the left interpolar and lower pole of  the kidney measuring 8.8 cm x 6.9 cm x 7.6 cm. Left renal vein was patent. No definitive evidence of invasion of the posterior oblique musculature. No ascites. No upper abdominal lymphadenopathy. Heterogenous enhancing bilateral adrenal nodules were noted measuring 2.2 cm on the right and 1.5 cm on the left.  Patient was referred to Dr. Hulen Shouts at [facility] and has an appointment on 06/27/2016 for urology evaluation.  She was referred to [name] for further evaluation of her anemia. Currently notes no active hematuria or rectal bleeding. No melena.  INTERVAL HISTORY  Pt returns today for a follow up and to discuss the results of her PET/CT. She is doing much better except for the fluid build up. She is on lasix and it has improved. No leg edema today, only mild swelling to her eye lids. Pt lost ~ 6 pounds this past week from taking lasix. She checks her BP everyday when she wakes up. Yesterday, 01/09/17 it was 160/82. The lasix have helped her BP to slightly improve. She still endorses mild diarrhea, but doesn't take imodium, because she forgets to take it. Pt reports her nausea has improved. She reports feeling urinary pressure, but notes having a dropped bladder. Pt is not a candidate for surgery or an other procedures to fix her prolapsed bladder. She is wondering if she has a UTI or if it has dropped more.  She would like to take a hair, skin, and nail vitamin to help her nails grow. She currently doesn't take a multivitamin. Her blood sugars are well controlled. She has a degenerative disc diease and endorses back pain. Pt denies fever, chills,  dysuria, and foggy urine. Denies any dental issues that could be possible side affects of the new bone medication. PET/CT results discussed in details.   MEDICAL HISTORY:    Hypertension Dyslipidemia Osteoarthritis Ex-smoker Coronary artery disease Thyroid disorder-was apparently on levothyroxine 25 g daily which has subsequently been discontinued  . 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 Earley Brooke Colonoscopy 03/2016 Dr. Earley Brooke  SOCIAL HISTORY: Social History   Social History  . Marital status: Married    Spouse name: N/A  . Number of children: N/A  . Years of education: N/A   Occupational History  . Not on file.   Social History Main Topics  . Smoking status: Former Smoker    Years: 8.00    Quit date: 06/18/1964  . Smokeless tobacco: Never Used  . Alcohol use No  . Drug use: No  . Sexual activity: Not on file   Other Topics Concern  . Not on file   Social History Narrative  . No narrative on file   Patient lives in [place] Works as a Solicitor part-time Ex-smoker quit long time ago In 1961 . Smoked 2 packs per day for 8 years prior to that .  or a lot of stress since her daughter is also getting treated for stage IV uterine cancer.  FAMILY HISTORY:  Mother deceased Father with asthma and emphysema died at 32 years with the MI. Brother deceased of heart disease   ALLERGIES:  is allergic to adhesive [tape]; nsaids; and statins.  MEDICATIONS:  Current Outpatient Prescriptions  Medication Sig Dispense Refill  . acetaminophen (TYLENOL) 500 MG tablet Take 1,000 mg by mouth 2 (two) times daily.     Marland Kitchen atorvastatin (LIPITOR) 40 MG tablet Take 40 mg by mouth daily at 6 PM.     . clobetasol ointment (TEMOVATE) 1.24 % Apply 1 application topically 2 (two) times daily.    . furosemide (LASIX) 20 MG tablet Take 20 mg by mouth daily.    Marland Kitchen glimepiride (AMARYL) 1 MG tablet Take 1 mg by mouth every evening.     . hydrocortisone (CORTEF) 10 MG tablet 10mg  po with breakfast and 5 mg po with lunch 90 tablet 1  . levothyroxine (SYNTHROID, LEVOTHROID) 25 MCG tablet Take 25 mcg by mouth daily before breakfast.    . lisinopril (PRINIVIL,ZESTRIL) 40 MG tablet  Take 40 mg by mouth every evening.     . ondansetron (ZOFRAN) 4 MG tablet Take 1 tablet (4 mg total) by mouth every 8 (eight) hours as needed for nausea. 30 tablet 1  . prochlorperazine (COMPAZINE) 10 MG tablet Take 1 tablet (10 mg total) by mouth every 6 (six) hours as needed for nausea or vomiting. 30 tablet 0  . SUNItinib (SUTENT) 37.5 MG capsule 37.5 mg po daily 2 weeks on 1 week off 28 capsule 1   No current facility-administered medications for this visit.     REVIEW OF SYSTEMS:    10 Point review of Systems was done is negative except as noted above.  PHYSICAL EXAMINATION:  ECOG PERFORMANCE STATUS: 1 - Symptomatic but completely ambulatory  . Vitals:   01/10/17 0856  BP: (!) 172/82  Pulse: 74  Resp: 18  Temp: 97.7 F (36.5 C)  SpO2: 100%   Filed Weights   01/10/17 0856  Weight: 132 lb 6.4 oz (60.1 kg)   .Body mass index  is 26.74 kg/m.  GENERAL:alert, in no acute distress and comfortable SKIN: skin color, texture, turgor are normal, no rashes or significant lesions EYES: normal, conjunctiva are pink and non-injected, sclera clear OROPHARYNX:no exudate, no erythema and lips, buccal mucosa, and tongue normal, no mouth sores  NECK: supple, no JVD, thyroid normal size, non-tender, without nodularity LYMPH:  no palpable lymphadenopathy in the cervical, axillary or inguinal LUNGS: clear to auscultation with normal respiratory effort HEART: regular rate & rhythm,  no murmurs and no lower extremity edema ABDOMEN: abdomen soft, non-tender, normoactive bowel sounds  Musculoskeletal: no cyanosis of digits and no clubbing  PSYCH: alert & oriented x 3 with fluent speech NEURO: no focal motor/sensory deficits  LABORATORY DATA:  I have reviewed the data as listed  . CBC Latest Ref Rng & Units 01/10/2017 11/29/2016 11/08/2016  WBC 3.9 - 10.3 10e3/uL 5.5 3.5(L) 4.8  Hemoglobin 11.6 - 15.9 g/dL 11.6 12.5 13.4  Hematocrit 34.8 - 46.6 % 34.6(L) 37.0 38.5  Platelets 145 - 400  10e3/uL 275 223 202   . CBC    Component Value Date/Time   WBC 5.5 01/10/2017 0835   WBC 10.7 (H) 07/29/2016 1526   RBC 3.43 (L) 01/10/2017 0835   RBC 3.53 (L) 07/29/2016 1526   HGB 11.6 01/10/2017 0835   HCT 34.6 (L) 01/10/2017 0835   PLT 275 01/10/2017 0835   MCV 100.9 01/10/2017 0835   MCH 33.8 01/10/2017 0835   MCH 26.1 07/29/2016 1526   MCHC 33.5 01/10/2017 0835   MCHC 31.6 07/29/2016 1526   RDW 15.7 (H) 01/10/2017 0835   LYMPHSABS 1.4 01/10/2017 0835   MONOABS 0.5 01/10/2017 0835   EOSABS 0.1 01/10/2017 0835   BASOSABS 0.0 01/10/2017 0835     . CMP Latest Ref Rng & Units 01/10/2017 11/29/2016 11/08/2016  Glucose 70 - 140 mg/dl 107 106 77  BUN 7.0 - 26.0 mg/dL 27.8(H) 27.4(H) 33.0(H)  Creatinine 0.6 - 1.1 mg/dL 2.1(H) 1.9(H) 1.9(H)  Sodium 136 - 145 mEq/L 136 133(L) 124(L)  Potassium 3.5 - 5.1 mEq/L 3.6 4.4 4.5  Chloride 101 - 111 mmol/L - - -  CO2 22 - 29 mEq/L 23 22 21(L)  Calcium 8.4 - 10.4 mg/dL 9.9 9.9 9.6  Total Protein 6.4 - 8.3 g/dL 7.8 7.8 7.6  Total Bilirubin 0.20 - 1.20 mg/dL 0.66 0.71 0.62  Alkaline Phos 40 - 150 U/L 74 78 107  AST 5 - 34 U/L 26 30 25   ALT 0 - 55 U/L 20 28 24    B12 level 299 (OSH)-->455        RADIOGRAPHIC STUDIES: I have personally reviewed the radiological images as listed and agreed with the findings in the report. Nm Pet Image Restag (ps) Skull Base To Thigh  Result Date: 01/03/2017 IMPRESSION: 1. There is a new lesion in the hepatic dome which is FDG avid and low in attenuation on CT imaging consistent with metastatic disease. Another region of uptake in the left hepatic lobe posteriorly demonstrates no CT correlate. A second subtle metastasis is not excluded on today's study. An MRI could better assess for other hepatic metastases if clinically warranted. 2. New metastatic lesion in the left side of T8. 3. New pulmonary nodule in the right upper lobe. This is too small to characterize but suspicious. Recommend attention on  follow-up. No FDG avid nodules or other enlarging nodules. 4. The uptake at the previous left renal artery has almost resolved in the interval and is favored to be post therapeutic. Recommend  attention on follow-up. 5. The metastasis in the posterior right hilum seen previously has resolved. 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 PET/CT is consistent with left kidney renal cell carcinoma.  Rt adrenal gland bx - showed clear cell RCC  S/p CYtoreductive left radical nephrectomy and left adrenal gland resection on 08/01/2016 by Dr Alinda Money. 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  #3 Small pulmonary lesions -- PET scan suggestive of metastases from [place]  MRI brain shows no evidence of metastatic disease  #4 Grade 1 Nausea - improved, she hasn't needed to take her prescribed medication #5 Grade 1 Diarrhea - from sutent controlled with prn Imodium #6 Hyponatremia - sodium level - likely related to some element of adrenal insufficiency, diarrhea, limited by mouth intake. Will need to rule out hypothyroidism. Primarily solute free fluid intake. HCTZ use Sodium levels are up from 124 to near normal at 134 with appropriate adjustment of her hydrocortisone dose Resolved at this time. Plan -continue Sutent at current dose -continue Hydrocortisone to 10mg  in AM and 5AM with lunch - f/u with Dr Buddy Duty -increased po fluid hydration with electrolyte rich fluids -Pt started lasix -We'll follow up on pending thyroid function tests from today. -control diarrhea with prn imodium  -PET/CT shows that the spots on her lungs have improved.  -She has a small spot on the  thoracic T8 vertebrae  1-2 possible spots on the liver.   She is tolerating her current doses well and taking as prescribed.  Will start on Xgeva for suspected bone mets  -Return to clinic with [name] in 6 weeks with repeat labs. -RTC for Xgeva on Tuesday, 01/14/17 and q6 weeks   #4  Microcytic hypochromic anemia  primarily appears to be related to anemia of chronic disease related to her newly diagnosed malignancy.  Thyroid function is within normal limits. LDH within normal limits and suggests no evidence of hemolysis. Hemoglobin has resolved since surgery. hgb stable at 12.5  #5 Moderate regarding calorie malnutrition Weight has stabilized Wt Readings from Last 3 Encounters:  01/10/17 132 lb 6.4 oz (60.1 kg)  11/29/16 131 lb 11.2 oz (59.7 kg)  11/08/16 132 lb 8 oz (60.1 kg)    Plan Continue healthy po intake/diabetic diet -recommend to drink atleast 48-64 oz of fluids daily  #6 Hypothyroidism/Adrenal insufficiency/Diabetes -Continue F/u with Dr Buddy Duty  #7 HTN - ?control. Patient dense to be anxious and has higher blood pressures in the clinic. She can have increased blood pressure from Sutent as well. Plan -Patient has been taking her blood pressure every morning when she wakes up -She has followed up with her PCP for management.   Continue follow-up with primary care physician and Dr Buddy Duty.   Plz schedule Xgeva for Tuesday 01/14/17 q6 weeks (with clinic visits after the 1st dose) RTC with [name] in 6 weeks with labs  All of the patients questions were answered with apparent satisfaction. The patient knows to call the clinic with any problems, questions or concerns.  I spent 20 minutes counseling the patient face to face. The total time spent in the appointment was 30 minutes and more than 50% was  on counseling and direct patient cares.    Audrey Lone MD [place] AAHIVMS [facility] Hematology/Oncology Physician [place]  (Office):       [phone] (Work  cell):  [phone] (Fax):           [phone]  This document serves as a record of services personally performed by Audrey Lone, MD. It was created on her behalf by Audrey [name], a trained medical scribe. The creation of this record is based on the scribe's personal observations and the provider's statements to them. This document has been checked and approved by the attending provider.

## 2017-01-10 ENCOUNTER — Encounter: Payer: Self-pay | Admitting: Hematology

## 2017-01-10 ENCOUNTER — Ambulatory Visit (HOSPITAL_BASED_OUTPATIENT_CLINIC_OR_DEPARTMENT_OTHER): Payer: Medicare Other | Admitting: Hematology

## 2017-01-10 ENCOUNTER — Telehealth: Payer: Self-pay | Admitting: Hematology

## 2017-01-10 ENCOUNTER — Other Ambulatory Visit (HOSPITAL_BASED_OUTPATIENT_CLINIC_OR_DEPARTMENT_OTHER): Payer: Medicare Other

## 2017-01-10 VITALS — BP 172/82 | HR 74 | Temp 97.7°F | Resp 18 | Ht 59.0 in | Wt 132.4 lb

## 2017-01-10 DIAGNOSIS — C7972 Secondary malignant neoplasm of left adrenal gland: Secondary | ICD-10-CM

## 2017-01-10 DIAGNOSIS — M545 Low back pain: Secondary | ICD-10-CM | POA: Diagnosis not present

## 2017-01-10 DIAGNOSIS — R197 Diarrhea, unspecified: Secondary | ICD-10-CM | POA: Diagnosis not present

## 2017-01-10 DIAGNOSIS — E2749 Other adrenocortical insufficiency: Secondary | ICD-10-CM | POA: Diagnosis not present

## 2017-01-10 DIAGNOSIS — C7971 Secondary malignant neoplasm of right adrenal gland: Secondary | ICD-10-CM | POA: Diagnosis not present

## 2017-01-10 DIAGNOSIS — R911 Solitary pulmonary nodule: Secondary | ICD-10-CM

## 2017-01-10 DIAGNOSIS — E119 Type 2 diabetes mellitus without complications: Secondary | ICD-10-CM

## 2017-01-10 DIAGNOSIS — C649 Malignant neoplasm of unspecified kidney, except renal pelvis: Secondary | ICD-10-CM

## 2017-01-10 DIAGNOSIS — R110 Nausea: Secondary | ICD-10-CM | POA: Diagnosis not present

## 2017-01-10 DIAGNOSIS — C642 Malignant neoplasm of left kidney, except renal pelvis: Secondary | ICD-10-CM

## 2017-01-10 DIAGNOSIS — E871 Hypo-osmolality and hyponatremia: Secondary | ICD-10-CM | POA: Diagnosis not present

## 2017-01-10 DIAGNOSIS — E039 Hypothyroidism, unspecified: Secondary | ICD-10-CM | POA: Diagnosis not present

## 2017-01-10 DIAGNOSIS — C7951 Secondary malignant neoplasm of bone: Secondary | ICD-10-CM

## 2017-01-10 DIAGNOSIS — E440 Moderate protein-calorie malnutrition: Secondary | ICD-10-CM

## 2017-01-10 DIAGNOSIS — R7989 Other specified abnormal findings of blood chemistry: Secondary | ICD-10-CM

## 2017-01-10 LAB — COMPREHENSIVE METABOLIC PANEL
ALT: 20 U/L (ref 0–55)
AST: 26 U/L (ref 5–34)
Albumin: 4.2 g/dL (ref 3.5–5.0)
Alkaline Phosphatase: 74 U/L (ref 40–150)
Anion Gap: 13 mEq/L — ABNORMAL HIGH (ref 3–11)
BUN: 27.8 mg/dL — ABNORMAL HIGH (ref 7.0–26.0)
CO2: 23 mEq/L (ref 22–29)
Calcium: 9.9 mg/dL (ref 8.4–10.4)
Chloride: 100 mEq/L (ref 98–109)
Creatinine: 2.1 mg/dL — ABNORMAL HIGH (ref 0.6–1.1)
EGFR: 22 mL/min/{1.73_m2} — ABNORMAL LOW (ref >90)
Glucose: 107 mg/dl (ref 70–140)
Potassium: 3.6 mEq/L (ref 3.5–5.1)
Sodium: 136 mEq/L (ref 136–145)
Total Bilirubin: 0.66 mg/dL (ref 0.20–1.20)
Total Protein: 7.8 g/dL (ref 6.4–8.3)

## 2017-01-10 LAB — CBC & DIFF AND RETIC
BASO%: 0.2 % (ref 0.0–2.0)
Basophils Absolute: 0 10*3/uL (ref 0.0–0.1)
EOS%: 1.6 % (ref 0.0–7.0)
Eosinophils Absolute: 0.1 10*3/uL (ref 0.0–0.5)
HCT: 34.6 % — ABNORMAL LOW (ref 34.8–46.6)
HGB: 11.6 g/dL (ref 11.6–15.9)
Immature Retic Fract: 11.5 % — ABNORMAL HIGH (ref 1.60–10.00)
LYMPH%: 25.7 % (ref 14.0–49.7)
MCH: 33.8 pg (ref 25.1–34.0)
MCHC: 33.5 g/dL (ref 31.5–36.0)
MCV: 100.9 fL (ref 79.5–101.0)
MONO#: 0.5 10*3/uL (ref 0.1–0.9)
MONO%: 9.1 % (ref 0.0–14.0)
NEUT#: 3.5 10*3/uL (ref 1.5–6.5)
NEUT%: 63.4 % (ref 38.4–76.8)
Platelets: 275 10*3/uL (ref 145–400)
RBC: 3.43 10*6/uL — ABNORMAL LOW (ref 3.70–5.45)
RDW: 15.7 % — ABNORMAL HIGH (ref 11.2–14.5)
Retic %: 1.84 % (ref 0.70–2.10)
Retic Ct Abs: 63.11 10*3/uL (ref 33.70–90.70)
WBC: 5.5 10*3/uL (ref 3.9–10.3)
lymph#: 1.4 10*3/uL (ref 0.9–3.3)

## 2017-01-10 LAB — UA PROTEIN, DIPSTICK - CHCC: Protein, ur: NEGATIVE mg/dL

## 2017-01-10 NOTE — Patient Instructions (Signed)
Thank you for choosing [facility] to provide your oncology and hematology care.  To afford each patient quality time with our providers, please arrive 30 minutes before your scheduled appointment time.  If you arrive late for your appointment, you may be asked to reschedule.  We strive to give you quality time with our providers, and arriving late affects you and other patients whose appointments are after yours.   If you are a no show for multiple scheduled visits, you may be dismissed from the clinic at the providers discretion.    Again, thank you for choosing [facility], our hope is that these requests will decrease the amount of time that you wait before being seen by our physicians.  ______________________________________________________________________  Should you have questions after your visit to the [facility], please contact our office at [phone] between the hours of 8:30 and 4:30 p.m.    Voicemails left after 4:30p.m will not be returned until the following business day.    For prescription refill requests, please have your pharmacy contact us directly.  Please also try to allow 48 hours for prescription requests.    Please contact the scheduling department for questions regarding scheduling.  For scheduling of procedures such as PET scans, CT scans, MRI, Ultrasound, etc please contact central scheduling at [phone].    Resources For Cancer Patients and Caregivers:   Oncolink.org:  A wonderful resource for patients and healthcare providers for information regarding your disease, ways to tract your treatment, what to expect, etc.     American Cancer Society:  800-227-2345  Can help patients locate various types of support and financial assistance  Cancer Care: 1-800-813-HOPE (4673) Provides financial assistance, online support groups, medication/co-pay assistance.    [place] DSS:  336-641-3447 Where to apply for food  stamps, Medicaid, and utility assistance  Medicare Rights Center: 800-333-4114 Helps people with Medicare understand their rights and benefits, navigate the Medicare system, and secure the quality healthcare they deserve  SCAT: 336-333-6589 [place] [employer]'s shared-ride transportation service for eligible riders who have a disability that prevents them from riding the fixed route bus.    For additional information on assistance programs please contact our social worker:   Grier Hock/Abigail Elmore:  [phone]            

## 2017-01-10 NOTE — Telephone Encounter (Signed)
Gave avs and calendar for September and November

## 2017-01-14 ENCOUNTER — Ambulatory Visit (HOSPITAL_BASED_OUTPATIENT_CLINIC_OR_DEPARTMENT_OTHER): Payer: Medicare Other

## 2017-01-14 VITALS — BP 154/80 | HR 74 | Temp 97.6°F | Resp 16

## 2017-01-14 DIAGNOSIS — C7971 Secondary malignant neoplasm of right adrenal gland: Secondary | ICD-10-CM | POA: Diagnosis not present

## 2017-01-14 DIAGNOSIS — C7972 Secondary malignant neoplasm of left adrenal gland: Secondary | ICD-10-CM

## 2017-01-14 DIAGNOSIS — C7951 Secondary malignant neoplasm of bone: Secondary | ICD-10-CM | POA: Diagnosis not present

## 2017-01-14 DIAGNOSIS — C642 Malignant neoplasm of left kidney, except renal pelvis: Secondary | ICD-10-CM

## 2017-01-14 MED ORDER — DENOSUMAB 120 MG/1.7ML ~~LOC~~ SOLN
120.0000 mg | Freq: Once | SUBCUTANEOUS | Status: AC
  Administered 2017-01-14: 120 mg via SUBCUTANEOUS
  Filled 2017-01-14: qty 1.7

## 2017-01-17 ENCOUNTER — Telehealth: Payer: Self-pay

## 2017-01-17 MED ORDER — MAGIC MOUTHWASH W/LIDOCAINE: 5.0000 mL | Freq: Four times a day (QID) | ORAL | 0 refills | Status: DC | PRN

## 2017-01-17 NOTE — Telephone Encounter (Signed)
Pt called with problem with mouth/tongue, is getting raw. Is burning all the time. Tongue looks normal to pt. It is interfering with eating or drinking. Was irritating for a few weeks but is more intense now and cannot tolerate it any more. She is asking for MMW or what md deems appropriate. [facility] in [place].   She is on sutent Next ov 11/2

## 2017-01-17 NOTE — Telephone Encounter (Signed)
S/w Dr Irene Limbo and got MMW order. Called in to [facility] in [place]. Called pt to let her know.

## 2017-01-17 NOTE — Addendum Note (Signed)
Addended by: Janace Hoard on: 01/17/2017 05:19 PM   Modules accepted: Orders

## 2017-02-10 ENCOUNTER — Other Ambulatory Visit: Payer: Self-pay

## 2017-02-10 ENCOUNTER — Telehealth: Payer: Self-pay

## 2017-02-10 MED ORDER — MAGIC MOUTHWASH W/LIDOCAINE: 5.0000 mL | Freq: Four times a day (QID) | ORAL | 1 refills | Status: DC | PRN

## 2017-02-10 NOTE — Telephone Encounter (Signed)
Spoke with [employer] and verbalized okay per [name] for pt to receive 133mL of magic mouthwash with one refill instead of 267mL of magic mouthwash. Pharmacist verbalized understanding and requested name for documentation purposes. Original order had not yet been filled. New phone-in order placed for confirmation.

## 2017-02-20 ENCOUNTER — Other Ambulatory Visit: Payer: Self-pay | Admitting: *Deleted

## 2017-02-20 DIAGNOSIS — C642 Malignant neoplasm of left kidney, except renal pelvis: Secondary | ICD-10-CM

## 2017-02-20 NOTE — Progress Notes (Signed)
Marland Kitchen    HEMATOLOGY/ONCOLOGY CLINIC NOTE  Date of Service: 02/21/17  PCP: Kristine Linea MD ([place]) Endocrinolgy - Dr Buddy Duty GI- Dr Bubba Camp  CHIEF COMPLAINTS/PURPOSE OF CONSULTATION:  Renal cell carcinoma  Anemia  HISTORY OF PRESENTING ILLNESS:  plz see previous note for details of HPI  INTERVAL HISTORY  She reports that she is doing well overall. She notes that her blood sugar levels have been doing well. She will be followed with Dr. Buddy Duty on Monday, 02/24/2017. She is being followed by Dr. Buddy Duty for her thyroid issues. Her cardiologist, Dr. Sabra Heck in [place], [place] primarily manages her care. She denies issues with Xgeva. She notes no significant issues with Sutent. Notes some dysguesia - grade 1 but is still able to eat well.  She has a birthday in a couple of weeks and she notes that she will stay at home with family. 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 She voices that she wished that she could ambulate more, but is unable to do so due to her left knee issues.     On review of systems, she reports decreased taste sensation. She also reports a burning sensation to her tongue with rough, salty, spicy foods. She uses magic mouthwash several times a day to aid with her symptoms. She denies oral ulcers. She notes a cool sensation to her tongue "all the time". She reports chronic diarrhea that she treats with imodium intermittently. She denies decreased energy levels. She reports increased PO intake. She reports insomnia. She reports abdominal cramping with her current treatment regimen. She is on 10 mg hydrocortisone in the morning only. She denies BLE swelling. She reports inflammation to her left knee. She used to obtain cortisone injections for this, but her prior doctor moved away so she hasn't had it. She denies bringing this to the attention of her PCP. She reports bilateral eyelid swelling.  She denies change in bowel habits. She reports  clear drainage to her bilateral eyes at night. She denies having a prior hx of seasonal allergies. She denies any actue back pain. She reports increase in her prior episodes of tremors.   MEDICAL HISTORY:    Hypertension Dyslipidemia Osteoarthritis Ex-smoker Coronary artery disease Thyroid disorder-was apparently on levothyroxine 25 g daily which has subsequently been discontinued . 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 Earley Brooke Colonoscopy 03/2016 Dr. Earley Brooke  SOCIAL HISTORY: Social History   Socioeconomic History  . Marital status: Married    Spouse name: Not on file  . Number of children: Not on file  . Years of education: Not on file  . Highest education level: Not on file  Social Needs  . Financial resource strain: Not on file  . Food insecurity - worry: Not on file  . Food insecurity - inability: Not on file  . Transportation needs - medical: Not on file  . Transportation needs - non-medical: Not on file  Occupational History  . Not on file  Tobacco Use  . Smoking status: Former Smoker    Years: 8.00    Last attempt to quit: 06/18/1964    Years since quitting: 52.7  . Smokeless tobacco: Never Used  Substance and Sexual Activity  . Alcohol use: No  . Drug use: No  . Sexual activity: Not on file  Other Topics Concern  . Not on file  Social History Narrative  . Not on file   Patient lives in [place] Works as a Solicitor part-time Ex-smoker quit long time ago In 1961 . Smoked 2 packs per day for 8 years prior to that .  or a lot of stress since her daughter is also getting treated for stage IV uterine cancer.  FAMILY HISTORY:  Mother deceased Father with asthma and emphysema died at 12 years with the MI. Brother deceased of heart disease   ALLERGIES:  is allergic to adhesive [tape];  nsaids; and statins.  MEDICATIONS:  Current Outpatient Prescriptions  Medication Sig Dispense Refill  . acetaminophen (TYLENOL) 500 MG tablet Take 1,000 mg by mouth 2 (two) times daily.     Marland Kitchen atorvastatin (LIPITOR) 40 MG tablet Take 40 mg by mouth daily at 6 PM.     . clobetasol ointment (TEMOVATE) 1.61 % Apply 1 application topically 2 (two) times daily.    . diphenhydrAMINE (BENADRYL) 25 MG tablet Take 25 mg by mouth at bedtime as needed.    . furosemide (LASIX) 20 MG tablet Take 20 mg by mouth daily.    Marland Kitchen glimepiride (AMARYL) 1 MG tablet Take 1 mg by mouth every evening.     . hydrocortisone (CORTEF) 10 MG tablet 10mg  po with breakfast and 5 mg po with lunch (Patient taking differently: 10mg  po with breakfast) 90 tablet 1  . levothyroxine (SYNTHROID, LEVOTHROID) 25 MCG tablet Take 25 mcg by mouth daily before breakfast.    . lisinopril (PRINIVIL,ZESTRIL) 40 MG tablet Take 40 mg by mouth every evening.     . magic mouthwash w/lidocaine SOLN Take 5 mLs by mouth 4 (four) times daily as needed for mouth pain. Swish and spit 120 mL 1  . ondansetron (ZOFRAN) 4 MG tablet Take 1 tablet (4 mg total) by mouth every 8 (eight) hours as needed for nausea. 30 tablet 1  . prochlorperazine (COMPAZINE) 10 MG tablet Take 1 tablet (10 mg total) by mouth every 6 (six) hours as needed for nausea or vomiting. 30 tablet 0  . SUNItinib (SUTENT) 37.5 MG capsule 37.5 mg po daily 2 weeks on 1 week off 28 capsule 1   Current Facility-Administered Medications  Medication Dose Route Frequency Provider Last Rate Last Dose  . denosumab (XGEVA) injection 120 mg  120 mg Subcutaneous Once Brunetta Genera, MD        REVIEW OF SYSTEMS:    10 Point review of Systems was done is negative except as noted above.  PHYSICAL EXAMINATION:  ECOG PERFORMANCE STATUS: 1 - Symptomatic but completely ambulatory  . Vitals:   02/21/17 0949  BP: (!) 159/70  Pulse: 72  Resp: 20  Temp: 97.8 F (36.6 C)  SpO2: 100%   Filed  Weights   02/21/17 0949  Weight: 135 lb 1.6 oz (61.3 kg)   .Body mass index is 27.29 kg/m.  GENERAL:alert, in no acute distress and comfortable SKIN: skin color, texture, turgor are normal, no rashes or significant lesions EYES: normal, conjunctiva are pink and non-injected, sclera clear. Mild puffy bilateral eyelids that is improving.  OROPHARYNX:no exudate, no erythema and lips, buccal mucosa, and tongue normal, no mouth sores  NECK: supple, no JVD, thyroid normal size, non-tender, without nodularity LYMPH:  no palpable lymphadenopathy in the cervical, axillary or inguinal LUNGS: clear to auscultation with normal respiratory effort HEART: regular rate & rhythm,  no murmurs and no lower extremity edema ABDOMEN: abdomen soft, non-tender, normoactive bowel sounds  Musculoskeletal: no cyanosis of  digits and no clubbing. No tenderness over the spine.  PSYCH: alert & oriented x 3 with fluent speech NEURO: no focal motor/sensory deficits   LABORATORY DATA:  I have reviewed the data as listed  . CBC Latest Ref Rng & Units 02/21/2017 01/10/2017 11/29/2016  WBC 3.9 - 10.3 10e3/uL 5.3 5.5 3.5(L)  Hemoglobin 11.6 - 15.9 g/dL 10.9(L) 11.6 12.5  Hematocrit 34.8 - 46.6 % 32.4(L) 34.6(L) 37.0  Platelets 145 - 400 10e3/uL 261 275 223   . CBC    Component Value Date/Time   WBC 5.3 02/21/2017 0932   WBC 10.7 (H) 07/29/2016 1526   RBC 3.10 (L) 02/21/2017 0932   RBC 3.53 (L) 07/29/2016 1526   HGB 10.9 (L) 02/21/2017 0932   HCT 32.4 (L) 02/21/2017 0932   PLT 261 02/21/2017 0932   MCV 104.5 (H) 02/21/2017 0932   MCH 35.3 (H) 02/21/2017 0932   MCH 26.1 07/29/2016 1526   MCHC 33.8 02/21/2017 0932   MCHC 31.6 07/29/2016 1526   RDW 14.1 02/21/2017 0932   LYMPHSABS 1.2 02/21/2017 0932   MONOABS 0.4 02/21/2017 0932   EOSABS 0.1 02/21/2017 0932   BASOSABS 0.0 02/21/2017 0932     . CMP Latest Ref Rng & Units 02/21/2017 01/10/2017 11/29/2016  Glucose 70 - 140 mg/dl 98 107 106  BUN 7.0 - 26.0  mg/dL 28.3(H) 27.8(H) 27.4(H)  Creatinine 0.6 - 1.1 mg/dL 1.9(H) 2.1(H) 1.9(H)  Sodium 136 - 145 mEq/L 136 136 133(L)  Potassium 3.5 - 5.1 mEq/L 3.7 3.6 4.4  Chloride 101 - 111 mmol/L - - -  CO2 22 - 29 mEq/L 24 23 22   Calcium 8.4 - 10.4 mg/dL 8.7 9.9 9.9  Total Protein 6.4 - 8.3 g/dL 7.2 7.8 7.8  Total Bilirubin 0.20 - 1.20 mg/dL 0.55 0.66 0.71  Alkaline Phos 40 - 150 U/L 54 74 78  AST 5 - 34 U/L 20 26 30   ALT 0 - 55 U/L 13 20 28    B12 level 299 (OSH)-->455        RADIOGRAPHIC STUDIES: I have personally reviewed the radiological images as listed and agreed with the findings in the report. No results found. Nm Pet Image Restag (ps) Skull Base To Thigh  Result Date: 01/03/2017 IMPRESSION: 1. There is a new lesion in the hepatic dome which is FDG avid and low in attenuation on CT imaging consistent with metastatic disease. Another region of uptake in the left hepatic lobe posteriorly demonstrates no CT correlate. A second subtle metastasis is not excluded on today's study. An MRI could better assess for other hepatic metastases if clinically warranted. 2. New metastatic lesion in the left side of T8. 3. New pulmonary nodule in the right upper lobe. This is too small to characterize but suspicious. Recommend attention on follow-up. No FDG avid nodules or other enlarging nodules. 4. The uptake at the previous left renal artery has almost resolved in the interval and is favored to be post therapeutic. Recommend attention on follow-up. 5. The metastasis in the posterior right hilum seen previously has resolved. 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 PET/CT is consistent with left kidney renal cell carcinoma.  Rt adrenal gland bx - showed clear cell RCC  S/p CYtoreductive  left radical nephrectomy and left adrenal gland  resection on 08/01/2016 by Dr Alinda Money. Currently on Sutent  #2 b/l adrenal metastases from [place] s/p left adrenalectomy with adrenal insuff - follows with Dr Buddy Duty.  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 Will need to rule out hypothyroidism. Primarily solute free fluid intake. HCTZ use Sodium levels have normalized at 136. with appropriate adjustment of her hydrocortisone dose Resolved at this time. Plan -continue Sutent at current dose -continue Hydrocortisone to 10mg  in AM - f/u with Dr Buddy Duty for mx of adrenal insuff and hypothyroidism. -increased po fluid hydration with electrolyte rich fluids -control diarrhea with prn imodium -continue with Xgeva injections q 6 weeks  -She has a small spot on the thoracic T8 vertebrae  1-2 possible spots on the liver which we shall f/u on rpt PET/CT  Xgeva today and continue q6 weeks with clinic visits PET/CT in 5 weeks RTC with Dr Irene Limbo with labs in 6 weeks  #4  Microcytic hypochromic anemia  primarily appears to be related to anemia of chronic disease related to her newly diagnosed malignancy.  Thyroid function is within normal limits.  Hemoglobin at 10.9 as of 02/21/2017 ( a little lower - ? Related to CKD and sutent, will re-evaluate status of RCC with rpt PET)  #5 Moderate protein calorie malnutrition Weight has stabilized and improved Wt Readings from Last 3 Encounters:  02/21/17 135 lb 1.6 oz (61.3 kg)  01/10/17 132 lb 6.4 oz (60.1 kg)  11/29/16 131 lb 11.2 oz (59.7 kg)    Plan Continue  healthy po intake/diabetic diet -Previously recommended the patient to drink atleast 48-64 oz of fluids daily  #6 Hypothyroidism/Adrenal insufficiency/Diabetes -Continue F/u with Dr Buddy Duty, next appointment this Monday, 02/24/2017  #7 HTN - ?control. Patient tends to be anxious and has higher blood pressures in the clinic. She can have increased blood pressure from Sutent as well. Plan -Patient has been taking her blood pressure every morning when she wakes up -She has followed up with her PCP for management.   Continue follow-up with primary care physician and Dr Buddy Duty.  #8 Grade 1 mucositis -Advised the patient to continue use of Magic Mouthwash to aid with nutrition -At this time, I do not advise use of sucralfate due to patient renal function  Xgeva today and continue q6 weeks with clinic visits PET/CT in 5 weeks RTC with Dr Irene Limbo with labs in 6 weeks  All of the patients questions were answered with apparent satisfaction. The patient knows to call the clinic with any problems, questions or concerns.  I spent 20 minutes counseling the patient face to face. The total time spent in the appointment was 25 minutes and more than 50% was on counseling and direct patient cares.    .I have reviewed the above documentation for accuracy and completeness, and I agree with the above.  Sullivan Lone MD [place] AAHIVMS [facility] Hematology/Oncology Physician [facility]  (Office):       [phone] (Work cell):  [phone] (Fax):           [phone]  This document serves as a record  of services personally performed by Sullivan Lone, MD. It was created on his behalf by Steva Colder, a trained medical scribe. The creation of this record is based on the scribe's personal observations and the provider's statements to them. This document has been checked and approved by the attending provider.  Marland Kitchen

## 2017-02-21 ENCOUNTER — Encounter: Payer: Self-pay | Admitting: Hematology

## 2017-02-21 ENCOUNTER — Telehealth: Payer: Self-pay | Admitting: Hematology

## 2017-02-21 ENCOUNTER — Other Ambulatory Visit (HOSPITAL_BASED_OUTPATIENT_CLINIC_OR_DEPARTMENT_OTHER): Payer: Medicare Other

## 2017-02-21 ENCOUNTER — Ambulatory Visit (HOSPITAL_BASED_OUTPATIENT_CLINIC_OR_DEPARTMENT_OTHER): Payer: Medicare Other | Admitting: Hematology

## 2017-02-21 VITALS — BP 159/70 | HR 72 | Temp 97.8°F | Resp 20 | Ht 59.0 in | Wt 135.1 lb

## 2017-02-21 DIAGNOSIS — K529 Noninfective gastroenteritis and colitis, unspecified: Secondary | ICD-10-CM | POA: Diagnosis not present

## 2017-02-21 DIAGNOSIS — R911 Solitary pulmonary nodule: Secondary | ICD-10-CM

## 2017-02-21 DIAGNOSIS — M7989 Other specified soft tissue disorders: Secondary | ICD-10-CM

## 2017-02-21 DIAGNOSIS — C7971 Secondary malignant neoplasm of right adrenal gland: Secondary | ICD-10-CM

## 2017-02-21 DIAGNOSIS — C7951 Secondary malignant neoplasm of bone: Secondary | ICD-10-CM

## 2017-02-21 DIAGNOSIS — E119 Type 2 diabetes mellitus without complications: Secondary | ICD-10-CM | POA: Diagnosis not present

## 2017-02-21 DIAGNOSIS — R251 Tremor, unspecified: Secondary | ICD-10-CM | POA: Diagnosis not present

## 2017-02-21 DIAGNOSIS — D649 Anemia, unspecified: Secondary | ICD-10-CM | POA: Diagnosis not present

## 2017-02-21 DIAGNOSIS — E2749 Other adrenocortical insufficiency: Secondary | ICD-10-CM

## 2017-02-21 DIAGNOSIS — C7972 Secondary malignant neoplasm of left adrenal gland: Secondary | ICD-10-CM

## 2017-02-21 DIAGNOSIS — C642 Malignant neoplasm of left kidney, except renal pelvis: Secondary | ICD-10-CM | POA: Diagnosis present

## 2017-02-21 DIAGNOSIS — E039 Hypothyroidism, unspecified: Secondary | ICD-10-CM

## 2017-02-21 DIAGNOSIS — E871 Hypo-osmolality and hyponatremia: Secondary | ICD-10-CM

## 2017-02-21 LAB — CBC WITH DIFFERENTIAL/PLATELET
BASO%: 0.7 % (ref 0.0–2.0)
Basophils Absolute: 0 10*3/uL (ref 0.0–0.1)
EOS%: 1.9 % (ref 0.0–7.0)
Eosinophils Absolute: 0.1 10*3/uL (ref 0.0–0.5)
HCT: 32.4 % — ABNORMAL LOW (ref 34.8–46.6)
HGB: 10.9 g/dL — ABNORMAL LOW (ref 11.6–15.9)
LYMPH%: 22.6 % (ref 14.0–49.7)
MCH: 35.3 pg — ABNORMAL HIGH (ref 25.1–34.0)
MCHC: 33.8 g/dL (ref 31.5–36.0)
MCV: 104.5 fL — ABNORMAL HIGH (ref 79.5–101.0)
MONO#: 0.4 10*3/uL (ref 0.1–0.9)
MONO%: 7.3 % (ref 0.0–14.0)
NEUT#: 3.6 10*3/uL (ref 1.5–6.5)
NEUT%: 67.5 % (ref 38.4–76.8)
Platelets: 261 10*3/uL (ref 145–400)
RBC: 3.1 10*6/uL — ABNORMAL LOW (ref 3.70–5.45)
RDW: 14.1 % (ref 11.2–14.5)
WBC: 5.3 10*3/uL (ref 3.9–10.3)
lymph#: 1.2 10*3/uL (ref 0.9–3.3)

## 2017-02-21 LAB — COMPREHENSIVE METABOLIC PANEL
ALT: 13 U/L (ref 0–55)
AST: 20 U/L (ref 5–34)
Albumin: 3.7 g/dL (ref 3.5–5.0)
Alkaline Phosphatase: 54 U/L (ref 40–150)
Anion Gap: 11 mEq/L (ref 3–11)
BUN: 28.3 mg/dL — ABNORMAL HIGH (ref 7.0–26.0)
CO2: 24 mEq/L (ref 22–29)
Calcium: 8.7 mg/dL (ref 8.4–10.4)
Chloride: 101 mEq/L (ref 98–109)
Creatinine: 1.9 mg/dL — ABNORMAL HIGH (ref 0.6–1.1)
EGFR: 25 mL/min/{1.73_m2} — ABNORMAL LOW (ref >60)
Glucose: 98 mg/dl (ref 70–140)
Potassium: 3.7 mEq/L (ref 3.5–5.1)
Sodium: 136 mEq/L (ref 136–145)
Total Bilirubin: 0.55 mg/dL (ref 0.20–1.20)
Total Protein: 7.2 g/dL (ref 6.4–8.3)

## 2017-02-21 LAB — MAGNESIUM: Magnesium: 1.5 mg/dl (ref 1.5–2.5)

## 2017-02-21 LAB — LACTATE DEHYDROGENASE: LDH: 156 U/L (ref 125–245)

## 2017-02-21 MED ORDER — DENOSUMAB 120 MG/1.7ML ~~LOC~~ SOLN
120.0000 mg | Freq: Once | SUBCUTANEOUS | Status: AC
  Administered 2017-02-21: 120 mg via SUBCUTANEOUS
  Filled 2017-02-21: qty 1.7

## 2017-02-21 NOTE — Telephone Encounter (Signed)
Scheduled appt per 11/2 los - Gave patient AVS and calender per los.

## 2017-02-21 NOTE — Patient Instructions (Addendum)
Thank you for choosing [facility] to provide your oncology and hematology care.  To afford each patient quality time with our providers, please arrive 30 minutes before your scheduled appointment time.  If you arrive late for your appointment, you may be asked to reschedule.  We strive to give you quality time with our providers, and arriving late affects you and other patients whose appointments are after yours.   If you are a no show for multiple scheduled visits, you may be dismissed from the clinic at the providers discretion.    Again, thank you for choosing [facility], our hope is that these requests will decrease the amount of time that you wait before being seen by our physicians.  ______________________________________________________________________  Should you have questions after your visit to the [facility], please contact our office at [phone] between the hours of 8:30 and 4:30 p.m.    Voicemails left after 4:30p.m will not be returned until the following business day.    For prescription refill requests, please have your pharmacy contact us directly.  Please also try to allow 48 hours for prescription requests.    Please contact the scheduling department for questions regarding scheduling.  For scheduling of procedures such as PET scans, CT scans, MRI, Ultrasound, etc please contact central scheduling at [phone].    Resources For Cancer Patients and Caregivers:   Oncolink.org:  A wonderful resource for patients and healthcare providers for information regarding your disease, ways to tract your treatment, what to expect, etc.     [place]:  [phone])[phone]  Can help patients locate various types of support and financial assistance  Cancer Care: 1-800-813-HOPE (201)460-1740) Provides financial assistance, online support groups, medication/co-pay assistance.    [place]:  682-122-2055 Where to apply for food  stamps, Medicaid, and utility assistance  Medicare Rights Center: 978-549-3826 Helps people with Medicare understand their rights and benefits, navigate the Medicare system, and secure the quality healthcare they deserve  [employer]: [employer]'s shared-ride transportation service for eligible riders who have a disability that prevents them from riding the fixed route bus.    For additional information on assistance programs please contact our social worker:   [place]:  [phone]           Denosumab injection What is this medicine? DENOSUMAB (den oh sue mab) slows bone breakdown. Prolia is used to treat osteoporosis in women after menopause and in men. Delton See is used to treat a high calcium level due to cancer and to prevent bone fractures and other bone problems caused by multiple myeloma or cancer bone metastases. Delton See is also used to treat giant cell tumor of the bone. This medicine may be used for other purposes; ask your health care provider or pharmacist if you have questions. COMMON BRAND NAME(S): Prolia, XGEVA What should I tell my health care provider before I take this medicine? They need to know if you have any of these conditions: -dental disease -having surgery or tooth extraction -infection -kidney disease -low levels of calcium or Vitamin D in the blood -malnutrition -on hemodialysis -skin conditions or sensitivity -thyroid or parathyroid disease -an unusual reaction to denosumab, other medicines, foods, dyes, or preservatives -pregnant or trying to get pregnant -breast-feeding How should I use this medicine? This medicine is for injection under the skin. It is given by a health care professional in a hospital or clinic setting. If you are getting Prolia, a  special MedGuide will be given to you by the pharmacist with each prescription and refill. Be sure to read this information carefully each time. For Prolia,  talk to your pediatrician regarding the use of this medicine in children. Special care may be needed. For Delton See, talk to your pediatrician regarding the use of this medicine in children. While this drug may be prescribed for children as young as 13 years for selected conditions, precautions do apply. Overdosage: If you think you have taken too much of this medicine contact a poison control center or emergency room at once. NOTE: This medicine is only for you. Do not share this medicine with others. What if I miss a dose? It is important not to miss your dose. Call your doctor or health care professional if you are unable to keep an appointment. What may interact with this medicine? Do not take this medicine with any of the following medications: -other medicines containing denosumab This medicine may also interact with the following medications: -medicines that lower your chance of fighting infection -steroid medicines like prednisone or cortisone This list may not describe all possible interactions. Give your health care provider a list of all the medicines, herbs, non-prescription drugs, or dietary supplements you use. Also tell them if you smoke, drink alcohol, or use illegal drugs. Some items may interact with your medicine. What should I watch for while using this medicine? Visit your doctor or health care professional for regular checks on your progress. Your doctor or health care professional may order blood tests and other tests to see how you are doing. Call your doctor or health care professional for advice if you get a fever, chills or sore throat, or other symptoms of a cold or flu. Do not treat yourself. This drug may decrease your body's ability to fight infection. Try to avoid being around people who are sick. You should make sure you get enough calcium and vitamin D while you are taking this medicine, unless your doctor tells you not to. Discuss the foods you eat and the vitamins you  take with your health care professional. See your dentist regularly. Brush and floss your teeth as directed. Before you have any dental work done, tell your dentist you are receiving this medicine. Do not become pregnant while taking this medicine or for 5 months after stopping it. Talk with your doctor or health care professional about your birth control options while taking this medicine. Women should inform their doctor if they wish to become pregnant or think they might be pregnant. There is a potential for serious side effects to an unborn child. Talk to your health care professional or pharmacist for more information. What side effects may I notice from receiving this medicine? Side effects that you should report to your doctor or health care professional as soon as possible: -allergic reactions like skin rash, itching or hives, swelling of the face, lips, or tongue -bone pain -breathing problems -dizziness -jaw pain, especially after dental work -redness, blistering, peeling of the skin -signs and symptoms of infection like fever or chills; cough; sore throat; pain or trouble passing urine -signs of low calcium like fast heartbeat, muscle cramps or muscle pain; pain, tingling, numbness in the hands or feet; seizures -unusual bleeding or bruising -unusually weak or tired Side effects that usually do not require medical attention (report to your doctor or health care professional if they continue or are bothersome): -constipation -diarrhea -headache -joint pain -loss of appetite -muscle pain -runny nose -  tiredness -upset stomach This list may not describe all possible side effects. Call your doctor for medical advice about side effects. You may report side effects to FDA at 1-800-FDA-1088. Where should I keep my medicine? This medicine is only given in a clinic, doctor's office, or other health care setting and will not be stored at home. NOTE: This sheet is a summary. It may not  cover all possible information. If you have questions about this medicine, talk to your doctor, pharmacist, or health care provider.  2018 Elsevier/Gold Standard (2016-04-30 19:17:21)

## 2017-03-28 ENCOUNTER — Ambulatory Visit (HOSPITAL_COMMUNITY)
Admission: RE | Admit: 2017-03-28 | Discharge: 2017-03-28 | Disposition: A | Discharge status: Home / Self Care | Payer: Medicare Other | Source: Ambulatory Visit | Attending: Hematology | Admitting: Hematology

## 2017-03-28 DIAGNOSIS — C7951 Secondary malignant neoplasm of bone: Secondary | ICD-10-CM | POA: Insufficient documentation

## 2017-03-28 DIAGNOSIS — Z905 Acquired absence of kidney: Secondary | ICD-10-CM | POA: Diagnosis not present

## 2017-03-28 DIAGNOSIS — E896 Postprocedural adrenocortical (-medullary) hypofunction: Secondary | ICD-10-CM | POA: Diagnosis not present

## 2017-03-28 DIAGNOSIS — C642 Malignant neoplasm of left kidney, except renal pelvis: Secondary | ICD-10-CM | POA: Insufficient documentation

## 2017-03-28 DIAGNOSIS — R911 Solitary pulmonary nodule: Secondary | ICD-10-CM | POA: Insufficient documentation

## 2017-03-28 DIAGNOSIS — Z79899 Other long term (current) drug therapy: Secondary | ICD-10-CM | POA: Insufficient documentation

## 2017-03-28 LAB — GLUCOSE, CAPILLARY: Glucose-Capillary: 88 mg/dL (ref 65–99)

## 2017-03-28 MED ORDER — FLUDEOXYGLUCOSE F - 18 (FDG) INJECTION
6.4000 | Freq: Once | INTRAVENOUS | Status: AC | PRN
  Administered 2017-03-28: 6.4 via INTRAVENOUS

## 2017-04-03 NOTE — Progress Notes (Signed)
Marland Kitchen    HEMATOLOGY/ONCOLOGY CLINIC NOTE  Date of Service: 04/04/17  PCP: Kristine Linea MD ([facility]) Endocrinolgy - Dr Buddy Duty GI- Dr Bubba Camp  CHIEF COMPLAINTS/PURPOSE OF CONSULTATION:  Renal cell carcinoma  Anemia  HISTORY OF PRESENTING ILLNESS:  plz see previous note for details of HPI  Audrey [name] is here for a scheduled follow-up of her left renal cell carcinoma. She is accompanied by her son, Elberta Fortis. She reports that she is doing well and notes that she had a great thanksgiving. She is tolerating Sutent. The patient adds that her eating has improved. She denies new back pain, abdominal pain, and bilateral leg edema. However, she does have arthritis and notes feeling it mainly in her sciatic nerve. Her pain is worse with exertion. Jaquayla also reports that her eyes have become swollen, watery and is experiencing a clear discharge. She has not had a flu shot recently or the pneumonia vaccination.   On review of systems, she reports improved eating habits. Pt denies fever, chills, weight loss, decreased energy levels. Denies pain. Pt denies abdominal pain, back pain, nausea, vomiting.     MEDICAL HISTORY:    Hypertension Dyslipidemia Osteoarthritis Ex-smoker Coronary artery disease Thyroid disorder-was apparently on levothyroxine 25 g daily which has subsequently been discontinued . 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 Earley Brooke Colonoscopy 03/2016 Dr. Earley Brooke  SOCIAL HISTORY: Social History   Socioeconomic History  . Marital status: Married    Spouse name: Not on file  . Number of children: Not on file  . Years of education: Not on file  . Highest education level: Not on file  Social Needs  . Financial resource strain: Not on file  . Food insecurity - worry: Not on file  . Food  insecurity - inability: Not on file  . Transportation needs - medical: Not on file  . Transportation needs - non-medical: Not on file  Occupational History  . Not on file  Tobacco Use  . Smoking status: Former Smoker    Years: 8.00    Last attempt to quit: 06/18/1964    Years since quitting: 52.8  . Smokeless tobacco: Never Used  Substance and Sexual Activity  . Alcohol use: No  . Drug use: No  . Sexual activity: Not on file  Other Topics Concern  . Not on file  Social History Narrative  . Not on file   Patient lives in [place] Works as a Solicitor part-time Ex-smoker quit long time ago In 1961 . Smoked 2 packs per day for 8 years prior to that .  or a lot of stress since her daughter is also getting treated for stage IV uterine cancer.  FAMILY HISTORY:  Mother deceased Father with asthma and emphysema died at 42 years with the MI. Brother deceased of heart disease   ALLERGIES:  is allergic to adhesive [tape]; nsaids; and statins.  MEDICATIONS:  Current Outpatient Medications  Medication Sig Dispense Refill  . acetaminophen (TYLENOL) 500 MG tablet Take 1,000 mg by mouth 2 (two) times daily.     Marland Kitchen atorvastatin (LIPITOR) 40 MG tablet Take 40 mg by mouth daily at 6 PM.     . clobetasol ointment (TEMOVATE) 3.22 % Apply 1 application topically 2 (two) times daily.    . diphenhydrAMINE (BENADRYL) 25 MG tablet Take 25 mg by mouth at bedtime  as needed.    . furosemide (LASIX) 20 MG tablet Take 20 mg by mouth daily.    Marland Kitchen glimepiride (AMARYL) 1 MG tablet Take 1 mg by mouth every evening.     . hydrocortisone (CORTEF) 10 MG tablet 10mg  po with breakfast and 5 mg po with lunch (Patient taking differently: 10mg  po with breakfast) 90 tablet 1  . levothyroxine (SYNTHROID, LEVOTHROID) 25 MCG tablet Take 25 mcg by mouth daily before breakfast.    . lisinopril (PRINIVIL,ZESTRIL) 40 MG tablet Take 40 mg by mouth every evening.     . magic mouthwash w/lidocaine SOLN Take 5  mLs by mouth 4 (four) times daily as needed for mouth pain. Swish and spit 120 mL 1  . ondansetron (ZOFRAN) 4 MG tablet Take 1 tablet (4 mg total) by mouth every 8 (eight) hours as needed for nausea. 30 tablet 1  . prochlorperazine (COMPAZINE) 10 MG tablet Take 1 tablet (10 mg total) by mouth every 6 (six) hours as needed for nausea or vomiting. 30 tablet 0  . SUNItinib (SUTENT) 37.5 MG capsule 37.5 mg po daily 2 weeks on 1 week off 28 capsule 1   No current facility-administered medications for this visit.     REVIEW OF SYSTEMS:    10 Point review of Systems was done is negative except as noted above.  PHYSICAL EXAMINATION:  ECOG PERFORMANCE STATUS: 1 - Symptomatic but completely ambulatory  . Vitals:   04/04/17 0922  BP: (!) 148/78  Pulse: 83  Resp: 18  Temp: 97.6 F (36.4 C)  SpO2: 100%   Filed Weights   04/04/17 0922  Weight: 132 lb 3.2 oz (60 kg)   .Body mass index is 26.7 kg/m.  GENERAL: alert, in no acute distress and comfortable SKIN: skin color, texture, turgor are normal, no rashes or significant lesions EYES: normal, conjunctiva are pink and non-injected, sclera clear. Mild puffy bilateral eyelids that is improving.  OROPHARYNX: no exudate, no erythema and lips, buccal mucosa, and tongue normal, no mouth sores  NECK: supple, no JVD, thyroid normal size, non-tender, without nodularity LYMPH:  no palpable lymphadenopathy in the cervical, axillary or inguinal LUNGS: clear to auscultation with normal respiratory effort HEART: regular rate & rhythm,  no murmurs and no lower extremity edema ABDOMEN: abdomen soft, non-tender, normoactive bowel sounds  Musculoskeletal: no cyanosis of digits and no clubbing. No tenderness over the spine.  PSYCH: alert & oriented x 3 with fluent speech NEURO: no focal motor/sensory deficits   LABORATORY DATA:  I have reviewed the data as listed  . CBC Latest Ref Rng & Units 04/04/2017 02/21/2017 01/10/2017  WBC 3.9 - 10.3 10e3/uL 4.5  5.3 5.5  Hemoglobin 11.6 - 15.9 g/dL 11.2(L) 10.9(L) 11.6  Hematocrit 34.8 - 46.6 % 35.2 32.4(L) 34.6(L)  Platelets 145 - 400 10e3/uL 250 261 275   . CBC    Component Value Date/Time   WBC 4.5 04/04/2017 0830   WBC 10.7 (H) 07/29/2016 1526   RBC 3.27 (L) 04/04/2017 0830   RBC 3.53 (L) 07/29/2016 1526   HGB 11.2 (L) 04/04/2017 0830   HCT 35.2 04/04/2017 0830   PLT 250 04/04/2017 0830   MCV 107.6 (H) 04/04/2017 0830   MCH 34.3 (H) 04/04/2017 0830   MCH 26.1 07/29/2016 1526   MCHC 31.8 04/04/2017 0830   MCHC 31.6 07/29/2016 1526   RDW 14.0 04/04/2017 0830   LYMPHSABS 1.6 04/04/2017 0830   MONOABS 0.4 04/04/2017 0830   EOSABS 0.1 04/04/2017 0830  BASOSABS 0.0 04/04/2017 0830     . CMP Latest Ref Rng & Units 04/04/2017 02/21/2017 01/10/2017  Glucose 70 - 140 mg/dl 107 98 107  BUN 7.0 - 26.0 mg/dL 37.0(H) 28.3(H) 27.8(H)  Creatinine 0.6 - 1.1 mg/dL 2.2(H) 1.9(H) 2.1(H)  Sodium 136 - 145 mEq/L 140 136 136  Potassium 3.5 - 5.1 mEq/L 4.1 3.7 3.6  Chloride 101 - 111 mmol/L - - -  CO2 22 - 29 mEq/L 21(L) 24 23  Calcium 8.4 - 10.4 mg/dL 10.1 8.7 9.9  Total Protein 6.4 - 8.3 g/dL 7.5 7.2 7.8  Total Bilirubin 0.20 - 1.20 mg/dL 0.34 0.55 0.66  Alkaline Phos 40 - 150 U/L 65 54 74  AST 5 - 34 U/L 19 20 26   ALT 0 - 55 U/L 12 13 20    B12 level 299 (OSH)-->455        RADIOGRAPHIC STUDIES: I have personally reviewed the radiological images as listed and agreed with the findings in the report. Nm Pet Image Restag (ps) Skull Base To Thigh  Result Date: 03/28/2017 CLINICAL DATA:  Subsequent treatment strategy for renal cell carcinoma. EXAM: NUCLEAR MEDICINE PET SKULL BASE TO THIGH TECHNIQUE: 6.4 mCi F-18 FDG was injected intravenously. Full-ring PET imaging was performed from the skull base to thigh after the radiotracer. CT data was obtained and used for attenuation correction and anatomic localization. FASTING BLOOD GLUCOSE:  Value: 88 mg/dl COMPARISON:  PET-CT dated 10/03/2016  FINDINGS: NECK: No hypermetabolic cervical lymphadenopathy. CHEST: 6 mm right lower lobe pulmonary nodule (series 8/ image 31), previously 3 mm, with associated mild hypermetabolism given the small size (max SUV 1.1). 6 mm subpleural nodule in the posterior right middle lobe (series 8/ image 39), unchanged, non FDG avid although beneath the size threshold for PET sensitivity. The heart is normal size. No pericardial effusion. No evidence of thoracic aortic aneurysm. Atherosclerotic calcifications of the aortic arch. Three vessel coronary atherosclerosis. No hypermetabolic thoracic lymphadenopathy. ABDOMEN/PELVIS: Prior hepatic lesion on CT is no longer evident. No associated hypermetabolism in the liver. No hypermetabolic activity in the spleen, pancreas, or right adrenal gland. 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 Cholelithiasis with gallbladder sludge (series 4/ image 89). No intrahepatic extrahepatic ductal dilatation. Status post left adrenalectomy and left nephrectomy. No abnormal hypermetabolism in the surgical bed. No hypermetabolic abdominopelvic lymphadenopathy. 9 mm hyperdense/ hemorrhagic right upper pole renal cyst (series 4/ image 97). Right renal sinus cysts. Possible mild hydroureteronephrosis. Low-lying bladder/ pelvic floor laxity. Atherosclerotic calcifications of the abdominal aorta and branch vessels. SKELETON: Osseous metastasis in the T8 vertebral body, max SUV 12.5, previously 6.0. Mild hypermetabolism along the inferior aspect of the left scapula, max SUV 2.3. This appearance is indeterminate for osseous metastasis versus physiologic intramuscular activity. IMPRESSION: Mildly progressive 6 mm right lower lobe pulmonary nodule, suspicious for pulmonary metastasis. Prior hepatic metastases have resolved. Mildly progressive osseous metastasis in the T8 vertebral body. Possible osseous metastasis of of the inferior aspect of the left  scapula, indeterminate. Status post left adrenalectomy and left nephrectomy. Electronically Signed   By: Julian Hy M.D.   On: 03/28/2017 12:03   Nm Pet Image Restag (ps) Skull Base To Thigh  Result Date: 01/03/2017 IMPRESSION: 1. There is a new lesion in the hepatic dome which is FDG avid and low in attenuation on CT imaging consistent with metastatic disease. Another region of uptake in the left hepatic lobe posteriorly demonstrates no CT correlate. A second subtle  metastasis is not excluded on today's study. An MRI could better assess for other hepatic metastases if clinically warranted. 2. New metastatic lesion in the left side of T8. 3. New pulmonary nodule in the right upper lobe. This is too small to characterize but suspicious. Recommend attention on follow-up. No FDG avid nodules or other enlarging nodules. 4. The uptake at the previous left renal artery has almost resolved in the interval and is favored to be post therapeutic. Recommend attention on follow-up. 5. The metastasis in the posterior right hilum seen previously has resolved. 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 PET/CT is consistent with left kidney renal cell carcinoma.  Rt adrenal gland bx - showed clear cell RCC  S/p CYtoreductive left radical nephrectomy and left adrenal gland resection on 08/01/2016 by Dr Alinda Money. Currently on Sutent  #2 b/l adrenal metastases from [place] s/p left adrenalectomy with adrenal insuff - follows with Dr Buddy Duty.  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 Will need to rule out hypothyroidism. Primarily solute free fluid intake. HCTZ use Sodium levels have normalized at 136. with appropriate adjustment of her hydrocortisone dose Resolved at this time.  Plan Labs and PET/CT reviewed in details with the patient. -mixed response. Does not deifnitively meet recist criteria for progression. No new symptoms. -continue Sutent at current dose -continue Hydrocortisone to 10mg  in AM - f/u with Dr Buddy Duty for mx of adrenal insuff and hypothyroidism. -increased po fluid hydration with electrolyte rich fluids -control diarrhea with prn imodium -continue with Xgeva injections q 6 weeks  -discussed radiation to T8 but will hold off since she is not symptomatic at this time.  Continue [facility] RTC with Dr Irene Limbo in 6 weeks with labs Prevnar today PCV in 3 months  #9  Microcytic hypochromic anemia  primarily appears to be related to anemia of chronic disease related to her newly diagnosed malignancy.  Thyroid function is within normal limits.  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 Buddy Duty  #12 HTN - ?control. Patient tends to be anxious and has higher blood pressures in the clinic. She can have increased blood pressure from Sutent as well. Plan -Patient has been taking her blood pressure every morning when she wakes up -She has followed up with her  PCP for management.   Continue follow-up with primary care physician and Dr Buddy Duty.  #13 Grade 1 mucositis -Advised the patient to continue use of Magic Mouthwash to aid  with nutrition -At this time, I do not advise use of sucralfate due to patient renal function  Continue Alphonse Guild RTC with Dr Irene Limbo in 6 weeks with labs Prevnar today  All of the patients questions were answered with apparent satisfaction. The patient knows to call the clinic with any problems, questions or concerns.  I spent 20 minutes counseling the patient face to face. The total time spent in the appointment was 25 minutes and more than 50% was on counseling and direct patient cares.   Sullivan Lone MD [place] AAHIVMS [facility] Hematology/Oncology Physician [place]  (Office):       [phone] (Work cell):  [phone] (Fax):           [phone]  This document serves as a record of services personally performed by Sullivan Lone, MD. It was created on his behalf by Margit Banda, a trained medical scribe. The creation of this record is based on the scribe's personal observations and the provider's statements to them.   .I have reviewed the above documentation for accuracy and completeness, and I agree with the above. Brunetta Genera MD MS

## 2017-04-04 ENCOUNTER — Ambulatory Visit (HOSPITAL_BASED_OUTPATIENT_CLINIC_OR_DEPARTMENT_OTHER): Payer: Medicare Other | Admitting: Hematology

## 2017-04-04 ENCOUNTER — Ambulatory Visit (HOSPITAL_BASED_OUTPATIENT_CLINIC_OR_DEPARTMENT_OTHER): Payer: Medicare Other

## 2017-04-04 ENCOUNTER — Other Ambulatory Visit (HOSPITAL_BASED_OUTPATIENT_CLINIC_OR_DEPARTMENT_OTHER): Payer: Medicare Other

## 2017-04-04 ENCOUNTER — Telehealth: Payer: Self-pay | Admitting: Hematology

## 2017-04-04 VITALS — BP 148/78 | HR 83 | Temp 97.6°F | Resp 18 | Ht 59.0 in | Wt 132.2 lb

## 2017-04-04 DIAGNOSIS — D509 Iron deficiency anemia, unspecified: Secondary | ICD-10-CM

## 2017-04-04 DIAGNOSIS — C7972 Secondary malignant neoplasm of left adrenal gland: Secondary | ICD-10-CM

## 2017-04-04 DIAGNOSIS — Z23 Encounter for immunization: Secondary | ICD-10-CM | POA: Diagnosis not present

## 2017-04-04 DIAGNOSIS — C7951 Secondary malignant neoplasm of bone: Secondary | ICD-10-CM

## 2017-04-04 DIAGNOSIS — J984 Other disorders of lung: Secondary | ICD-10-CM | POA: Diagnosis not present

## 2017-04-04 DIAGNOSIS — C642 Malignant neoplasm of left kidney, except renal pelvis: Secondary | ICD-10-CM | POA: Diagnosis present

## 2017-04-04 DIAGNOSIS — C7971 Secondary malignant neoplasm of right adrenal gland: Secondary | ICD-10-CM | POA: Diagnosis not present

## 2017-04-04 DIAGNOSIS — E039 Hypothyroidism, unspecified: Secondary | ICD-10-CM

## 2017-04-04 LAB — CBC & DIFF AND RETIC
BASO%: 0.4 % (ref 0.0–2.0)
Basophils Absolute: 0 10*3/uL (ref 0.0–0.1)
EOS%: 2.4 % (ref 0.0–7.0)
Eosinophils Absolute: 0.1 10*3/uL (ref 0.0–0.5)
HCT: 35.2 % (ref 34.8–46.6)
HGB: 11.2 g/dL — ABNORMAL LOW (ref 11.6–15.9)
Immature Retic Fract: 15.3 % — ABNORMAL HIGH (ref 1.60–10.00)
LYMPH%: 35.8 % (ref 14.0–49.7)
MCH: 34.3 pg — ABNORMAL HIGH (ref 25.1–34.0)
MCHC: 31.8 g/dL (ref 31.5–36.0)
MCV: 107.6 fL — ABNORMAL HIGH (ref 79.5–101.0)
MONO#: 0.4 10*3/uL (ref 0.1–0.9)
MONO%: 8.2 % (ref 0.0–14.0)
NEUT#: 2.4 10*3/uL (ref 1.5–6.5)
NEUT%: 53.2 % (ref 38.4–76.8)
Platelets: 250 10*3/uL (ref 145–400)
RBC: 3.27 10*6/uL — ABNORMAL LOW (ref 3.70–5.45)
RDW: 14 % (ref 11.2–14.5)
Retic %: 1.44 % (ref 0.70–2.10)
Retic Ct Abs: 47.09 10*3/uL (ref 33.70–90.70)
WBC: 4.5 10*3/uL (ref 3.9–10.3)
lymph#: 1.6 10*3/uL (ref 0.9–3.3)

## 2017-04-04 LAB — LACTATE DEHYDROGENASE: LDH: 174 U/L (ref 125–245)

## 2017-04-04 LAB — COMPREHENSIVE METABOLIC PANEL
ALT: 12 U/L (ref 0–55)
AST: 19 U/L (ref 5–34)
Albumin: 4 g/dL (ref 3.5–5.0)
Alkaline Phosphatase: 65 U/L (ref 40–150)
Anion Gap: 14 mEq/L — ABNORMAL HIGH (ref 3–11)
BUN: 37 mg/dL — ABNORMAL HIGH (ref 7.0–26.0)
CO2: 21 mEq/L — ABNORMAL LOW (ref 22–29)
Calcium: 10.1 mg/dL (ref 8.4–10.4)
Chloride: 105 mEq/L (ref 98–109)
Creatinine: 2.2 mg/dL — ABNORMAL HIGH (ref 0.6–1.1)
EGFR: 21 mL/min/{1.73_m2} — ABNORMAL LOW (ref >60)
Glucose: 107 mg/dl (ref 70–140)
Potassium: 4.1 mEq/L (ref 3.5–5.1)
Sodium: 140 mEq/L (ref 136–145)
Total Bilirubin: 0.34 mg/dL (ref 0.20–1.20)
Total Protein: 7.5 g/dL (ref 6.4–8.3)

## 2017-04-04 LAB — TSH: TSH: 5.949 m(IU)/L — ABNORMAL HIGH (ref 0.308–3.960)

## 2017-04-04 MED ORDER — DENOSUMAB 120 MG/1.7ML ~~LOC~~ SOLN
120.0000 mg | Freq: Once | SUBCUTANEOUS | Status: AC
  Administered 2017-04-04: 120 mg via SUBCUTANEOUS

## 2017-04-04 MED ORDER — PNEUMOCOCCAL 13-VAL CONJ VACC IM SUSP
0.5000 mL | Freq: Once | INTRAMUSCULAR | Status: AC
  Administered 2017-04-04: 0.5 mL via INTRAMUSCULAR
  Filled 2017-04-04: qty 0.5

## 2017-04-04 NOTE — Telephone Encounter (Signed)
Per los already scheduled °

## 2017-04-04 NOTE — Patient Instructions (Signed)
Pneumococcal Conjugate Vaccine suspension for injection What is this medicine? PNEUMOCOCCAL VACCINE (NEU mo KOK al vak SEEN) is a vaccine used to prevent pneumococcus bacterial infections. These bacteria can cause serious infections like pneumonia, meningitis, and blood infections. This vaccine will lower your chance of getting pneumonia. If you do get pneumonia, it can make your symptoms milder and your illness shorter. This vaccine will not treat an infection and will not cause infection. This vaccine is recommended for infants and young children, adults with certain medical conditions, and adults 63 years or older. This medicine may be used for other purposes; ask your health care provider or pharmacist if you have questions. COMMON BRAND NAME(S): Prevnar, Prevnar 13 What should I tell my health care provider before I take this medicine? They need to know if you have any of these conditions: -bleeding problems -fever -immune system problems -an unusual or allergic reaction to pneumococcal vaccine, diphtheria toxoid, other vaccines, latex, other medicines, foods, dyes, or preservatives -pregnant or trying to get pregnant -breast-feeding How should I use this medicine? This vaccine is for injection into a muscle. It is given by a health care professional. A copy of Vaccine Information Statements will be given before each vaccination. Read this sheet carefully each time. The sheet may change frequently. Talk to your pediatrician regarding the use of this medicine in children. While this drug may be prescribed for children as young as 69 weeks old for selected conditions, precautions do apply. Overdosage: If you think you have taken too much of this medicine contact a poison control center or emergency room at once. NOTE: This medicine is only for you. Do not share this medicine with others. What if I miss a dose? It is important not to miss your dose. Call your doctor or health care professional  if you are unable to keep an appointment. What may interact with this medicine? -medicines for cancer chemotherapy -medicines that suppress your immune function -steroid medicines like prednisone or cortisone This list may not describe all possible interactions. Give your health care provider a list of all the medicines, herbs, non-prescription drugs, or dietary supplements you use. Also tell them if you smoke, drink alcohol, or use illegal drugs. Some items may interact with your medicine. What should I watch for while using this medicine? Mild fever and pain should go away in 3 days or less. Report any unusual symptoms to your doctor or health care professional. What side effects may I notice from receiving this medicine? Side effects that you should report to your doctor or health care professional as soon as possible: -allergic reactions like skin rash, itching or hives, swelling of the face, lips, or tongue -breathing problems -confused -fast or irregular heartbeat -fever over 102 degrees F -seizures -unusual bleeding or bruising -unusual muscle weakness Side effects that usually do not require medical attention (report to your doctor or health care professional if they continue or are bothersome): -aches and pains -diarrhea -fever of 102 degrees F or less -headache -irritable -loss of appetite -pain, tender at site where injected -trouble sleeping This list may not describe all possible side effects. Call your doctor for medical advice about side effects. You may report side effects to FDA at 1-800-FDA-1088. Where should I keep my medicine? This does not apply. This vaccine is given in a clinic, pharmacy, doctor's office, or other health care setting and will not be stored at home. NOTE: This sheet is a summary. It may not cover all possible information.  If you have questions about this medicine, talk to your doctor, pharmacist, or health care provider.  2018 Elsevier/Gold  Standard (2014-01-13 10:27:27) Denosumab injection What is this medicine? DENOSUMAB (den oh sue mab) slows bone breakdown. Prolia is used to treat osteoporosis in women after menopause and in men. Delton See is used to treat a high calcium level due to cancer and to prevent bone fractures and other bone problems caused by multiple myeloma or cancer bone metastases. Delton See is also used to treat giant cell tumor of the bone. This medicine may be used for other purposes; ask your health care provider or pharmacist if you have questions. COMMON BRAND NAME(S): Prolia, XGEVA What should I tell my health care provider before I take this medicine? They need to know if you have any of these conditions: -dental disease -having surgery or tooth extraction -infection -kidney disease -low levels of calcium or Vitamin D in the blood -malnutrition -on hemodialysis -skin conditions or sensitivity -thyroid or parathyroid disease -an unusual reaction to denosumab, other medicines, foods, dyes, or preservatives -pregnant or trying to get pregnant -breast-feeding How should I use this medicine? This medicine is for injection under the skin. It is given by a health care professional in a hospital or clinic setting. If you are getting Prolia, a special MedGuide will be given to you by the pharmacist with each prescription and refill. Be sure to read this information carefully each time. For Prolia, talk to your pediatrician regarding the use of this medicine in children. Special care may be needed. For Delton See, talk to your pediatrician regarding the use of this medicine in children. While this drug may be prescribed for children as young as 13 years for selected conditions, precautions do apply. Overdosage: If you think you have taken too much of this medicine contact a poison control center or emergency room at once. NOTE: This medicine is only for you. Do not share this medicine with others. What if I miss a  dose? It is important not to miss your dose. Call your doctor or health care professional if you are unable to keep an appointment. What may interact with this medicine? Do not take this medicine with any of the following medications: -other medicines containing denosumab This medicine may also interact with the following medications: -medicines that lower your chance of fighting infection -steroid medicines like prednisone or cortisone This list may not describe all possible interactions. Give your health care provider a list of all the medicines, herbs, non-prescription drugs, or dietary supplements you use. Also tell them if you smoke, drink alcohol, or use illegal drugs. Some items may interact with your medicine. What should I watch for while using this medicine? Visit your doctor or health care professional for regular checks on your progress. Your doctor or health care professional may order blood tests and other tests to see how you are doing. Call your doctor or health care professional for advice if you get a fever, chills or sore throat, or other symptoms of a cold or flu. Do not treat yourself. This drug may decrease your body's ability to fight infection. Try to avoid being around people who are sick. You should make sure you get enough calcium and vitamin D while you are taking this medicine, unless your doctor tells you not to. Discuss the foods you eat and the vitamins you take with your health care professional. See your dentist regularly. Brush and floss your teeth as directed. Before you have any dental work  done, tell your dentist you are receiving this medicine. Do not become pregnant while taking this medicine or for 5 months after stopping it. Talk with your doctor or health care professional about your birth control options while taking this medicine. Women should inform their doctor if they wish to become pregnant or think they might be pregnant. There is a potential for  serious side effects to an unborn child. Talk to your health care professional or pharmacist for more information. What side effects may I notice from receiving this medicine? Side effects that you should report to your doctor or health care professional as soon as possible: -allergic reactions like skin rash, itching or hives, swelling of the face, lips, or tongue -bone pain -breathing problems -dizziness -jaw pain, especially after dental work -redness, blistering, peeling of the skin -signs and symptoms of infection like fever or chills; cough; sore throat; pain or trouble passing urine -signs of low calcium like fast heartbeat, muscle cramps or muscle pain; pain, tingling, numbness in the hands or feet; seizures -unusual bleeding or bruising -unusually weak or tired Side effects that usually do not require medical attention (report to your doctor or health care professional if they continue or are bothersome): -constipation -diarrhea -headache -joint pain -loss of appetite -muscle pain -runny nose -tiredness -upset stomach This list may not describe all possible side effects. Call your doctor for medical advice about side effects. You may report side effects to FDA at 1-800-FDA-1088. Where should I keep my medicine? This medicine is only given in a clinic, doctor's office, or other health care setting and will not be stored at home. NOTE: This sheet is a summary. It may not cover all possible information. If you have questions about this medicine, talk to your doctor, pharmacist, or health care provider.  2018 Elsevier/Gold Standard (2016-04-30 19:17:21)

## 2017-04-04 NOTE — Patient Instructions (Addendum)
Thank you for choosing [facility] to provide your oncology and hematology care.  To afford each patient quality time with our providers, please arrive 30 minutes before your scheduled appointment time.  If you arrive late for your appointment, you may be asked to reschedule.  We strive to give you quality time with our providers, and arriving late affects you and other patients whose appointments are after yours.   If you are a no show for multiple scheduled visits, you may be dismissed from the clinic at the providers discretion.    Again, thank you for choosing [facility], our hope is that these requests will decrease the amount of time that you wait before being seen by our physicians.  ______________________________________________________________________  Should you have questions after your visit to the  Cancer Center, please contact our office at [phone] between the hours of 8:30 and 4:30 p.m.    Voicemails left after 4:30p.m will not be returned until the following business day.    For prescription refill requests, please have your pharmacy contact us directly.  Please also try to allow 48 hours for prescription requests.    Please contact the scheduling department for questions regarding scheduling.  For scheduling of procedures such as PET scans, CT scans, MRI, Ultrasound, etc please contact central scheduling at [phone].    Resources For Cancer Patients and Caregivers:   Oncolink.org:  A wonderful resource for patients and healthcare providers for information regarding your disease, ways to tract your treatment, what to expect, etc.     American Cancer Society:  800-227-2345  Can help patients locate various types of support and financial assistance  Cancer Care: 1-800-813-HOPE (4673) Provides financial assistance, online support groups, medication/co-pay assistance.    [place] DSS:  336-641-3447 Where to apply for food  stamps, Medicaid, and utility assistance  Medicare Rights Center: 800-333-4114 Helps people with Medicare understand their rights and benefits, navigate the Medicare system, and secure the quality healthcare they deserve  SCAT: 336-333-6589 [place] [employer]'s shared-ride transportation service for eligible riders who have a disability that prevents them from riding the fixed route bus.    For additional information on assistance programs please contact our social worker:   Grier Hock/Abigail Elmore:  [phone]            

## 2017-04-04 NOTE — Progress Notes (Unsigned)
Ok to give [place] with Creatine off 2.2 and BUN at 37.0 per [name]

## 2017-04-05 LAB — T4, FREE: T4,Free(Direct): 1.05 ng/dL (ref 0.82–1.77)

## 2017-04-11 ENCOUNTER — Encounter: Payer: Self-pay | Admitting: *Deleted

## 2017-04-11 ENCOUNTER — Other Ambulatory Visit: Payer: Self-pay | Admitting: *Deleted

## 2017-04-11 DIAGNOSIS — C649 Malignant neoplasm of unspecified kidney, except renal pelvis: Secondary | ICD-10-CM

## 2017-04-11 DIAGNOSIS — C642 Malignant neoplasm of left kidney, except renal pelvis: Secondary | ICD-10-CM | POA: Insufficient documentation

## 2017-04-11 MED ORDER — SUNITINIB MALATE 37.5 MG PO CAPS: ORAL_CAPSULE | ORAL | 1 refills | Status: DC

## 2017-04-25 ENCOUNTER — Telehealth: Payer: Self-pay

## 2017-04-25 ENCOUNTER — Other Ambulatory Visit: Payer: Self-pay

## 2017-04-25 MED ORDER — MAGIC MOUTHWASH W/LIDOCAINE: 5.0000 mL | Freq: Four times a day (QID) | ORAL | 1 refills | Status: DC | PRN

## 2017-04-25 NOTE — Telephone Encounter (Signed)
Magic mouthwash reordered via phone to [facility] in [place], [place].

## 2017-05-06 ENCOUNTER — Telehealth: Payer: Self-pay

## 2017-05-06 ENCOUNTER — Other Ambulatory Visit: Payer: Self-pay

## 2017-05-06 ENCOUNTER — Ambulatory Visit: Payer: Medicare Other | Admitting: Hematology

## 2017-05-06 ENCOUNTER — Other Ambulatory Visit: Payer: Medicare Other

## 2017-05-06 DIAGNOSIS — C7951 Secondary malignant neoplasm of bone: Secondary | ICD-10-CM

## 2017-05-06 NOTE — Telephone Encounter (Signed)
Pt c/o diarrhea yesterday. Unable to reach pt 1/14 to gather any further information. Called pt this morning and she said that the diarrhea had slowed some, but now she has started to vomit. 2 episodes of emesis and diarrhea this morning, per pt. Took some immodium yesterday, unspecified amount. 1 compazine PO taken this morning prior to vomiting. [name] recommends for pt to see PCP, Symptom Management, or come to our office today. Offered lab appt 1315 and doctor visit at 1340. Lab orders placed for CBC, CMET, and Mg. Pt said that she may not be able to get a ride, but will do her best. Encouraged her to call back if unable to come today, and to see her PCP or another doctor to assess the cause of her symptoms. Pt verbalized understanding of plan and that she would call back if unable to come to the office this afternoon.

## 2017-05-06 NOTE — Telephone Encounter (Signed)
Pt called in to cancel appointments stating she was feeling too unwell to come in today. Called and left VM with pt to keep our office informed of symptoms and changes. Encouraged pt to visit Urgent Care or nearby physician if able, and to go to the ED in the case of an emergency as the pt will likely become dehydration and lose necessary electrolytes with continued diarrhea and vomiting. Call back number provided [phone].

## 2017-05-06 NOTE — Telephone Encounter (Signed)
Pt called back expressing that she is concerned she may be getting dehydrated. She is continuing to experience vomiting and has been for the past 24 hours. Spoke with [name] and called the pt back. Per MD, the pt needs to go o the ED to receive IVF and potentially electrolyte replacement. Pt said that she hates to go to the ED nearby. I explained that it is very important for her to be seen this afternoon and evaluated for her needs. She responded, "They won't like being told what to do." This RN told the pt to explain that she has been having diarrhea for two days and vomiting for 24 hours. She could also tell them that she is taking treatment prescribed by her oncologist. If pt has any trouble with triage at the ED she may call me back at the desk until 1630 this afternoon. Pt verbalized understanding and will seek medical attention.

## 2017-05-08 ENCOUNTER — Telehealth: Payer: Self-pay

## 2017-05-08 NOTE — Telephone Encounter (Signed)
Pt called this morning questioning when to start back sutent. [name] recommends for pt to hold until her f/u visit on 1/25. At that time he will assess her symptoms and lab work to determine if pt's kidney function is appropriate to restart. Pt verbalized understanding and plan to come to visit on 1/25. Pt states, "I am feeling well today. Still having diarrhea, but that is my normal." Pt denied dizziness, vomiting, and nausea today.

## 2017-05-08 NOTE — Telephone Encounter (Signed)
Pt called to inform [name] that she was discharged from the hospital, and sent home with salt pills. Hospitalist took her off lasix for now.

## 2017-05-08 NOTE — Telephone Encounter (Signed)
Pt called in the afternoon on 05/07/17 to notify [name] that she went to the ED as encouraged and ended up being admitted to the hospital in [place], [place]. Pt being seen by Dr. Feliciana Rossetti and between treated with IVF and electrolyte replacement. Per hospitalist, pt ctn elevated and K 1.2 upon arrival. Pt doing well, and plans to keep the office in touch with any changes. Sutent being held until pt recovers.

## 2017-05-09 ENCOUNTER — Telehealth: Payer: Self-pay

## 2017-05-09 NOTE — Telephone Encounter (Signed)
[name] out of the office on 1/25. Spoke with pt to reschedule on 1/23. Lab appt 1015, MD 1040 and injection 1200. Pt aware of potential wait time for injection.

## 2017-05-09 NOTE — Telephone Encounter (Signed)
Pt daughter left VM for desk RN stating, "I called earlier this week and no one has called me back. It is now 12:30 and I expect a call back this afternoon. My mother has been in the hospital and back out in the last few days and I have not received any form of communication from your office." Daughter's number [phone].  Received verbal okay from pt to communicate with daughter, Altha Harm, regarding treatment and care. Daughter concerned that the pt is not telling our office everything that is going on. She stated, "I know you are saying you have spoken with her, but she started vomiting on Thursday, and did not call you until Monday. She always tells the rosey side of things." She asked, "Are you aware that she was told to stop her lasix and start taking sodium pills?" This RN replied awareness of this change, and that [name] is going to f/u with her on 1/23 and she will hold her Sutent until then. The daughter further vocalized, "Well, she does not know what specifically to do regarding her lasix."   During conversation with pt, she said, "I am feeling much better. I do not really want to take those sodium pills though because the doctor did not even check my sodium before I left. I am eating a normal diet and should replenish my salt." "You can share anything with my daughter, but I am sorry if she is bothering you. She has cancer too and worries a lot because I am here and she isn't."   Told daughter that I would share information with [name] to be aware for appointment on 1/23 to promote appropriate management of her symptoms and express to the patient the importance of sharing symptoms soon after they begin.

## 2017-05-12 ENCOUNTER — Telehealth: Payer: Self-pay | Admitting: Medical Oncology

## 2017-05-12 NOTE — Telephone Encounter (Signed)
err

## 2017-05-13 NOTE — Progress Notes (Signed)
Audrey [name]    HEMATOLOGY/ONCOLOGY CLINIC NOTE  Date of Service: 05/14/17  PCP: Kristine Linea MD ([place]) Endocrinolgy - Dr Buddy Duty GI- Dr Bubba Camp  CHIEF COMPLAINTS/PURPOSE OF CONSULTATION:  Renal cell carcinoma  Anemia  HISTORY OF PRESENTING ILLNESS:  plz see previous note for details of HPI  Audrey [name] is here for a scheduled follow-up of her left renal cell carcinoma. She is accompanied by her daughter. Her last visit with Audrey [name] was on 04/04/17. The pt reports that she is doing well overall. The pt notes difficulty obtaining a visit with he PCP and has seen her once. She notes that it is her 6 month visits to her cardiologist has yielded prescriptions for most of her medications.   Of note since the patient last visit, the pt was in the hospital for nausea and diarrhea causing dehydration and passing out twice. Her visit was accompanied with high potassium levels and AKI.Audrey [name] Patient delayed evaluation in the hospital despite strongly recommendation to do so promptly. She has not been using her anti-emetics and anti-diarrhea as recommended.  Pt notes having not taken her water pills since she got out of the hospital on Friday. Pt is still taking her lisinopril, but not her lasix which she was put off of for [name] days after her visit. She notes she has not been taking her sodium bicarbonate pills due to not trusting the ordering physician's advice. She notes that she is starting to develop some leg swelling.  On review of systems, pt reports eating well, back pain for 3 or 4 weeks,  and denies diarrhea, nausea, abdominal pains, changes in breathing, problems passing urine, leg swelling    MEDICAL HISTORY:    Hypertension Dyslipidemia Osteoarthritis Ex-smoker Coronary artery disease Thyroid disorder-was apparently on levothyroxine 25 g daily which has subsequently been discontinued . Diabetes Mitral regurgitation B12 deficiency  hiatal hernia with  esophagitis  Myocardial infarction in 1991 no interventions   SURGICAL HISTORY: No reported past surgeries EGD 05/01/2016 Dr. Earley Brooke Colonoscopy 03/2016 Dr. Earley Brooke  SOCIAL HISTORY: Social History   Socioeconomic History  . Marital status: Married    Spouse name: Not on file  . Number of children: Not on file  . Years of education: Not on file  . Highest education level: Not on file  Social Needs  . Financial resource strain: Not on file  . Food insecurity - worry: Not on file  . Food insecurity - inability: Not on file  . Transportation needs - medical: Not on file  . Transportation needs - non-medical: Not on file  Occupational History  . Not on file  Tobacco Use  . Smoking status: Former Smoker    Years: 8.00    Last attempt to quit: 06/18/1964    Years since quitting: 52.9  . Smokeless tobacco: Never Used  Substance and Sexual Activity  . Alcohol use: No  . Drug use: No  . Sexual activity: Not on file  Other Topics Concern  . Not on file  Social History Narrative  . Not on file   Patient lives in [place] Works as a Solicitor part-time Ex-smoker quit long time ago In 1961 . Smoked 2 packs per day for 8 years prior to that .  or a lot of stress since her daughter is also getting treated for stage IV uterine cancer.  FAMILY HISTORY:  Mother deceased Father with asthma and emphysema died at 44 years with the MI. Brother deceased of  heart disease   ALLERGIES:  is allergic to adhesive [tape]; nsaids; and statins.  MEDICATIONS:  Current Outpatient Medications  Medication Sig Dispense Refill  . acetaminophen (TYLENOL) 500 MG tablet Take 1,000 mg by mouth 2 ([name]) times daily.     Audrey [name] amLODipine (NORVASC) 2.5 MG tablet Take 2.5 mg by mouth daily.    Audrey [name] atorvastatin (LIPITOR) 40 MG tablet Take 40 mg by mouth daily at 6 PM.     . clobetasol ointment (TEMOVATE) 3.42 % Apply 1 application topically 2 ([name]) times daily.    . diphenhydrAMINE (BENADRYL) 25  MG tablet Take 25 mg by mouth at bedtime as needed.    . furosemide (LASIX) 20 MG tablet Take 20 mg by mouth daily.    Audrey [name] glimepiride (AMARYL) 1 MG tablet Take 1 mg by mouth every evening.     . hydrocortisone (CORTEF) 10 MG tablet 10mg  po with breakfast and 5 mg po with lunch (Patient taking differently: 10mg  po with breakfast) 90 tablet 1  . levothyroxine (SYNTHROID, LEVOTHROID) 25 MCG tablet Take 25 mcg by mouth daily before breakfast.    . lisinopril (PRINIVIL,ZESTRIL) 40 MG tablet Take 40 mg by mouth every evening.     . magic mouthwash w/lidocaine SOLN Take 5 mLs by mouth 4 (four) times daily as needed for mouth pain. Swish and spit. (1 Part viscous lidocaine 2% 1 Part Maalox 1 Part diphenhydramine 12.5 mg per 86mL elixir) 120 mL 1  . ondansetron (ZOFRAN) 4 MG tablet Take 1 tablet (4 mg total) by mouth every 8 (eight) hours as needed for nausea. 30 tablet 1  . prochlorperazine (COMPAZINE) 10 MG tablet Take 1 tablet (10 mg total) by mouth every 6 (six) hours as needed for nausea or vomiting. 30 tablet 0  . SUNItinib (SUTENT) 37.5 MG capsule 37.5 mg po daily 2 weeks on 1 week off 28 capsule 1   No current facility-administered medications for this visit.     REVIEW OF SYSTEMS:    10 Point review of Systems was done is negative except as noted above.  PHYSICAL EXAMINATION:  ECOG PERFORMANCE STATUS: 1 - Symptomatic but completely ambulatory  . Vitals:   05/14/17 1112  BP: (!) 162/66  Pulse: 67  Resp: 16  Temp: 97.6 F (36.4 C)  SpO2: 100%   Filed Weights   05/14/17 1112  Weight: 137 lb 11.2 oz (62.5 kg)   .Body mass index is 27.81 kg/m.  GENERAL: alert, in no acute distress and comfortable SKIN: skin color, texture, turgor are normal, no rashes or significant lesions EYES: normal, conjunctiva are pink and non-injected, sclera clear. Mild puffy bilateral eyelids that is improving.  OROPHARYNX: no exudate, no erythema and lips, buccal mucosa, and tongue normal, no mouth sores    NECK: supple, no JVD, thyroid normal size, non-tender, without nodularity LYMPH:  no palpable lymphadenopathy in the cervical, axillary or inguinal LUNGS: clear to auscultation with normal respiratory effort HEART: regular rate & rhythm,  no murmurs and no lower extremity edema ABDOMEN: abdomen soft, non-tender, normoactive bowel sounds  Musculoskeletal: no cyanosis of digits and no clubbing. No tenderness over the spine. TTP over left flank.  PSYCH: alert & oriented x 3 with fluent speech NEURO: no focal motor/sensory deficits   LABORATORY DATA:  I have reviewed the data as listed  . CBC Latest Ref Rng & Units 05/14/2017 04/04/2017 02/21/2017  WBC 3.9 - 10.3 K/uL 6.3 4.5 5.3  Hemoglobin 11.6 - 15.9 g/dL - 11.2(L) 10.9(L)  Hematocrit  34.8 - 46.6 % 28.0(L) 35.2 32.4(L)  Platelets 145 - 400 K/uL 210 250 261  hgb 8.9 . CBC    Component Value Date/Time   WBC 6.3 05/14/2017 1007   WBC 4.5 04/04/2017 0830   WBC 10.7 (H) 07/29/2016 1526   RBC 2.62 (L) 05/14/2017 1007   RBC 2.62 (L) 05/14/2017 1007   HGB 11.2 (L) 04/04/2017 0830   HCT 28.0 (L) 05/14/2017 1007   HCT 35.2 04/04/2017 0830   PLT 210 05/14/2017 1007   PLT 250 04/04/2017 0830   MCV 106.9 (H) 05/14/2017 1007   MCV 107.6 (H) 04/04/2017 0830   MCH 34.0 05/14/2017 1007   MCHC 31.8 05/14/2017 1007   RDW 15.3 05/14/2017 1007   RDW 14.0 04/04/2017 0830   LYMPHSABS 1.4 05/14/2017 1007   LYMPHSABS 1.6 04/04/2017 0830   MONOABS 0.4 05/14/2017 1007   MONOABS 0.4 04/04/2017 0830   EOSABS 0.8 (H) 05/14/2017 1007   EOSABS 0.1 04/04/2017 0830   BASOSABS 0.0 05/14/2017 1007   BASOSABS 0.0 04/04/2017 0830     . CMP Latest Ref Rng & Units 05/14/2017 04/04/2017 02/21/2017  Glucose 70 - 140 mg/dL 70 107 98  BUN 7 - 26 mg/dL 23 37.0(H) 28.3(H)  Creatinine 0.60 - 1.10 mg/dL 1.56(H) 2.2(H) 1.9(H)  Sodium 136 - 145 mmol/L 137 140 136  Potassium 3.3 - 4.7 mmol/L 4.9(H) 4.1 3.7  Chloride 98 - 109 mmol/L 108 - -  CO2 22 - 29 mmol/L  20(L) 21(L) 24  Calcium 8.4 - 10.4 mg/dL 9.0 10.1 8.7  Total Protein 6.4 - 8.3 g/dL 6.9 7.5 7.2  Total Bilirubin 0.2 - 1.2 mg/dL 0.4 0.34 0.55  Alkaline Phos 40 - 150 U/L 49 65 54  AST 5 - 34 U/L 14 19 20   ALT 0 - 55 U/L 11 12 13    B12 level 299 (OSH)-->455        RADIOGRAPHIC STUDIES: I have personally reviewed the radiological images as listed and agreed with the findings in the report. Nm Pet Image Restag (ps) Skull Base To Thigh  Result Date: 01/03/2017 IMPRESSION: 1. There is a new lesion in the hepatic dome which is FDG avid and low in attenuation on CT imaging consistent with metastatic disease. Another region of uptake in the left hepatic lobe posteriorly demonstrates no CT correlate. A second subtle metastasis is not excluded on today's study. An MRI could better assess for other hepatic metastases if clinically warranted. 2. New metastatic lesion in the left side of T8. 3. New pulmonary nodule in the right upper lobe. This is too small to characterize but suspicious. Recommend attention on follow-up. No FDG avid nodules or other enlarging nodules. 4. The uptake at the previous left renal artery has almost resolved in the interval and is favored to be post therapeutic. Recommend attention on follow-up. 5. The metastasis in the posterior right hilum seen previously has resolved. 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 PET/CT is consistent with left kidney renal cell carcinoma.  Rt adrenal gland bx - showed clear cell RCC -Discussed patients history of labwork, and today's (05/14/17) labs are pending -Pt is tolerating the Sutent well so far, and has been off for one week.   S/p CYtoreductive left radical nephrectomy and left adrenal gland  resection on 08/01/2016 by Dr  Alinda Money.  #2 b/l adrenal metastases from [place] s/p left adrenalectomy with adrenal insuff - follows with Dr Buddy Duty.  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 #8 Hyponatremia - sodium level - likely related to some element of adrenal insufficiency, diarrhea, limited by mouth intake. Primarily solute free fluid intake. HCTZ use Sodium levels have normalized at 136. with appropriate adjustment of her hydrocortisone dose Resolved at this time.  Plan -patient recently admission with nausea and significant diarrhea.- sounds like possible viral gastroenteritis. Symptoms has resolved. Patient was in the hospital and needed IVF. -creatinine and lytes stable today. -okay to restart dose adjusted Sutent at 37.5mg  po daily 2 weeks on 1 week off. -continue Hydrocortisone to 10mg  in AM - f/u with Dr Buddy Duty for mx of adrenal insuff and hypothyroidism. -increased po fluid hydration with electrolyte rich fluids -control mild diarrhea from Sutent with prn imodium -zofran prn for nausea. -continue with Xgeva injections q 6 weeks  -discussed radiation to T8 but will hold off since she is not symptomatic at this time. -patient to call if worsening nausea/vomtiing.  #9  Microcytic hypochromic anemia  primarily appears to be related to anemia of chronic disease related to her newly diagnosed malignancy.  Thyroid function is within normal limits.  Hemoglobin at 11.2 as of 04/04/2017  hgb dropped to 8.9 -will need to monitor. -denies GI bleeding.  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 Buddy Duty  #12 HTN - ?control. Patient tends to be anxious and has higher blood pressures in the clinic. She can have increased blood pressure from Sutent as well. Plan -Patient has been taking her blood pressure every morning when she wakes up Strongly encouraged pt to f/u with her PCP /cardiology regarding the many elements necessary for her care that are not directly related to her oncology care. -The changing elements of her health need centralized coordination  #13 Grade 1 mucositis -Advised the patient to continue use of Magic Mouthwash to aid with nutrition -At this time, I do not advise use of sucralfate due to patient renal function  PET/CT in 5 weeks RTC with Dr Irene Limbo in 6 weeks with labs Continue Xgeva q6 weeks  All of the patients questions were answered with apparent satisfaction. The patient knows to call the clinic with any problems, questions or concerns.  I spent 25 minutes counseling the patient face to face. The total time spent in the appointment was 30 minutes and more than 50% was on counseling and direct patient cares.   Sullivan Lone MD [place] AAHIVMS [facility] Hematology/Oncology Physician [facility]  (Office):       [phone] (Work cell):  [phone] (Fax):           [phone]  This document serves  as a record of services personally performed by Sullivan Lone, MD. It was created on his behalf by Baldwin Jamaica, a trained medical scribe. The creation of this record is based on the scribe's personal observations and the provider's statements to them.   .I have reviewed the above documentation for accuracy and completeness, and I agree with the  above. Brunetta Genera MD MS

## 2017-05-14 ENCOUNTER — Inpatient Hospital Stay: Payer: Medicare Other

## 2017-05-14 ENCOUNTER — Inpatient Hospital Stay: Payer: Medicare Other | Attending: Hematology | Admitting: Hematology

## 2017-05-14 ENCOUNTER — Telehealth: Payer: Self-pay | Admitting: Hematology

## 2017-05-14 ENCOUNTER — Other Ambulatory Visit: Payer: Medicare Other

## 2017-05-14 ENCOUNTER — Ambulatory Visit: Payer: Medicare Other | Admitting: Hematology

## 2017-05-14 VITALS — BP 162/66 | HR 67 | Temp 97.6°F | Resp 16 | Wt 137.7 lb

## 2017-05-14 DIAGNOSIS — E785 Hyperlipidemia, unspecified: Secondary | ICD-10-CM

## 2017-05-14 DIAGNOSIS — Z79899 Other long term (current) drug therapy: Secondary | ICD-10-CM | POA: Insufficient documentation

## 2017-05-14 DIAGNOSIS — I2510 Atherosclerotic heart disease of native coronary artery without angina pectoris: Secondary | ICD-10-CM | POA: Diagnosis not present

## 2017-05-14 DIAGNOSIS — D509 Iron deficiency anemia, unspecified: Secondary | ICD-10-CM | POA: Diagnosis not present

## 2017-05-14 DIAGNOSIS — R197 Diarrhea, unspecified: Secondary | ICD-10-CM | POA: Diagnosis not present

## 2017-05-14 DIAGNOSIS — E119 Type 2 diabetes mellitus without complications: Secondary | ICD-10-CM | POA: Diagnosis not present

## 2017-05-14 DIAGNOSIS — C7970 Secondary malignant neoplasm of unspecified adrenal gland: Secondary | ICD-10-CM | POA: Diagnosis not present

## 2017-05-14 DIAGNOSIS — C7800 Secondary malignant neoplasm of unspecified lung: Secondary | ICD-10-CM | POA: Diagnosis not present

## 2017-05-14 DIAGNOSIS — E2740 Unspecified adrenocortical insufficiency: Secondary | ICD-10-CM | POA: Insufficient documentation

## 2017-05-14 DIAGNOSIS — Z87891 Personal history of nicotine dependence: Secondary | ICD-10-CM | POA: Insufficient documentation

## 2017-05-14 DIAGNOSIS — C7951 Secondary malignant neoplasm of bone: Secondary | ICD-10-CM

## 2017-05-14 DIAGNOSIS — C787 Secondary malignant neoplasm of liver and intrahepatic bile duct: Secondary | ICD-10-CM | POA: Insufficient documentation

## 2017-05-14 DIAGNOSIS — R110 Nausea: Secondary | ICD-10-CM | POA: Diagnosis not present

## 2017-05-14 DIAGNOSIS — M1990 Unspecified osteoarthritis, unspecified site: Secondary | ICD-10-CM | POA: Diagnosis not present

## 2017-05-14 DIAGNOSIS — E039 Hypothyroidism, unspecified: Secondary | ICD-10-CM | POA: Diagnosis not present

## 2017-05-14 DIAGNOSIS — E538 Deficiency of other specified B group vitamins: Secondary | ICD-10-CM | POA: Diagnosis not present

## 2017-05-14 DIAGNOSIS — K449 Diaphragmatic hernia without obstruction or gangrene: Secondary | ICD-10-CM | POA: Diagnosis not present

## 2017-05-14 DIAGNOSIS — I340 Nonrheumatic mitral (valve) insufficiency: Secondary | ICD-10-CM | POA: Insufficient documentation

## 2017-05-14 DIAGNOSIS — I252 Old myocardial infarction: Secondary | ICD-10-CM

## 2017-05-14 DIAGNOSIS — I10 Essential (primary) hypertension: Secondary | ICD-10-CM | POA: Insufficient documentation

## 2017-05-14 DIAGNOSIS — C642 Malignant neoplasm of left kidney, except renal pelvis: Secondary | ICD-10-CM | POA: Insufficient documentation

## 2017-05-14 DIAGNOSIS — N2889 Other specified disorders of kidney and ureter: Secondary | ICD-10-CM

## 2017-05-14 DIAGNOSIS — E871 Hypo-osmolality and hyponatremia: Secondary | ICD-10-CM | POA: Insufficient documentation

## 2017-05-14 LAB — CBC WITH DIFFERENTIAL (CANCER CENTER ONLY)
Basophils Absolute: 0 10*3/uL (ref 0.0–0.1)
Basophils Relative: 1 %
Eosinophils Absolute: 0.8 10*3/uL — ABNORMAL HIGH (ref 0.0–0.5)
Eosinophils Relative: 13 %
HCT: 28 % — ABNORMAL LOW (ref 34.8–46.6)
Hemoglobin: 8.9 g/dL — ABNORMAL LOW (ref 11.6–15.9)
Lymphocytes Relative: 22 %
Lymphs Abs: 1.4 10*3/uL (ref 0.9–3.3)
MCH: 34 pg (ref 25.1–34.0)
MCHC: 31.8 g/dL (ref 31.5–36.0)
MCV: 106.9 fL — ABNORMAL HIGH (ref 79.5–101.0)
Monocytes Absolute: 0.4 10*3/uL (ref 0.1–0.9)
Monocytes Relative: 7 %
Neutro Abs: 3.7 10*3/uL (ref 1.5–6.5)
Neutrophils Relative %: 59 %
Platelet Count: 210 10*3/uL (ref 145–400)
RBC: 2.62 MIL/uL — ABNORMAL LOW (ref 3.70–5.45)
RDW: 15.3 % (ref 11.2–16.1)
WBC Count: 6.3 10*3/uL (ref 3.9–10.3)

## 2017-05-14 LAB — RETICULOCYTES
RBC.: 2.62 MIL/uL — ABNORMAL LOW (ref 3.70–5.45)
Retic Count, Absolute: 47.2 10*3/uL (ref 33.7–90.7)
Retic Ct Pct: 1.8 % (ref 0.7–2.1)

## 2017-05-14 LAB — LACTATE DEHYDROGENASE: LDH: 153 U/L (ref 125–245)

## 2017-05-14 LAB — COMPREHENSIVE METABOLIC PANEL
ALT: 11 U/L (ref 0–55)
AST: 14 U/L (ref 5–34)
Albumin: 3.6 g/dL (ref 3.5–5.0)
Alkaline Phosphatase: 49 U/L (ref 40–150)
Anion gap: 9 (ref 3–11)
BUN: 23 mg/dL (ref 7–26)
CO2: 20 mmol/L — ABNORMAL LOW (ref 22–29)
Calcium: 9 mg/dL (ref 8.4–10.4)
Chloride: 108 mmol/L (ref 98–109)
Creatinine, Ser: 1.56 mg/dL — ABNORMAL HIGH (ref 0.60–1.10)
GFR calc Af Amer: 36 mL/min — ABNORMAL LOW (ref >60)
GFR calc non Af Amer: 31 mL/min — ABNORMAL LOW (ref >60)
Glucose, Bld: 70 mg/dL (ref 70–140)
Potassium: 4.9 mmol/L — ABNORMAL HIGH (ref 3.3–4.7)
Sodium: 137 mmol/L (ref 136–145)
Total Bilirubin: 0.4 mg/dL (ref 0.2–1.2)
Total Protein: 6.9 g/dL (ref 6.4–8.3)

## 2017-05-14 LAB — MAGNESIUM: Magnesium: 2 mg/dL (ref 1.5–2.5)

## 2017-05-14 MED ORDER — DENOSUMAB 120 MG/1.7ML ~~LOC~~ SOLN
120.0000 mg | Freq: Once | SUBCUTANEOUS | Status: AC
  Administered 2017-05-14: 120 mg via SUBCUTANEOUS

## 2017-05-14 NOTE — Patient Instructions (Signed)
Thank you for choosing [facility] to provide your oncology and hematology care.  To afford each patient quality time with our providers, please arrive 30 minutes before your scheduled appointment time.  If you arrive late for your appointment, you may be asked to reschedule.  We strive to give you quality time with our providers, and arriving late affects you and other patients whose appointments are after yours.   If you are a no show for multiple scheduled visits, you may be dismissed from the clinic at the providers discretion.    Again, thank you for choosing [facility], our hope is that these requests will decrease the amount of time that you wait before being seen by our physicians.  ______________________________________________________________________  Should you have questions after your visit to the [facility], please contact our office at [phone] between the hours of 8:30 and 4:30 p.m.    Voicemails left after 4:30p.m will not be returned until the following business day.    For prescription refill requests, please have your pharmacy contact us directly.  Please also try to allow 48 hours for prescription requests.    Please contact the scheduling department for questions regarding scheduling.  For scheduling of procedures such as PET scans, CT scans, MRI, Ultrasound, etc please contact central scheduling at [phone].    Resources For Cancer Patients and Caregivers:   Oncolink.org:  A wonderful resource for patients and healthcare providers for information regarding your disease, ways to tract your treatment, what to expect, etc.     American Cancer Society:  800-227-2345  Can help patients locate various types of support and financial assistance  Cancer Care: 1-800-813-HOPE (4673) Provides financial assistance, online support groups, medication/co-pay assistance.    [place] DSS:  336-641-3447 Where to apply for food  stamps, Medicaid, and utility assistance  Medicare Rights Center: 800-333-4114 Helps people with Medicare understand their rights and benefits, navigate the Medicare system, and secure the quality healthcare they deserve  SCAT: 336-333-6589 [place] [employer]'s shared-ride transportation service for eligible riders who have a disability that prevents them from riding the fixed route bus.    For additional information on assistance programs please contact our social worker:   Grier Hock/Abigail Elmore:  [phone]            

## 2017-05-14 NOTE — Telephone Encounter (Signed)
Patient scheduled and given copy of AVS/Calendar °

## 2017-05-16 ENCOUNTER — Ambulatory Visit: Payer: Medicare Other | Admitting: Hematology

## 2017-05-16 ENCOUNTER — Ambulatory Visit: Payer: Medicare Other

## 2017-05-16 ENCOUNTER — Other Ambulatory Visit: Payer: Medicare Other

## 2017-05-28 ENCOUNTER — Telehealth: Payer: Self-pay | Admitting: Pharmacist

## 2017-05-28 DIAGNOSIS — C649 Malignant neoplasm of unspecified kidney, except renal pelvis: Secondary | ICD-10-CM

## 2017-05-28 MED ORDER — SUNITINIB MALATE 37.5 MG PO CAPS: ORAL_CAPSULE | ORAL | 1 refills | Status: DC

## 2017-05-28 NOTE — Telephone Encounter (Signed)
Oral Chemotherapy Pharmacist Encounter   Received VM from patient with request for Sutent refill to be sent to manufacturer assistance pharmacy. Sutent refill e-scribed to [facility] as they are the contracted pharmacy for [place] patient assistance program.  I called and updated patient. She will call pharmacy in a few days to shedule next fill.  Patient knows to call the office with additional questions or concerns.  Thank you,  Johny Drilling, PharmD, BCPS, BCOP 05/28/2017   1:44 PM Oral Oncology Clinic [phone]

## 2017-06-03 ENCOUNTER — Other Ambulatory Visit: Payer: Self-pay | Admitting: Hematology

## 2017-06-03 DIAGNOSIS — C642 Malignant neoplasm of left kidney, except renal pelvis: Secondary | ICD-10-CM

## 2017-06-03 DIAGNOSIS — C7951 Secondary malignant neoplasm of bone: Secondary | ICD-10-CM

## 2017-06-03 MED ORDER — ONDANSETRON HCL 4 MG PO TABS: 4.0000 mg | ORAL_TABLET | Freq: Three times a day (TID) | ORAL | 2 refills | Status: DC | PRN

## 2017-06-19 ENCOUNTER — Ambulatory Visit (HOSPITAL_COMMUNITY)
Admission: RE | Admit: 2017-06-19 | Discharge: 2017-06-19 | Disposition: A | Discharge status: Home / Self Care | Payer: Medicare Other | Source: Ambulatory Visit | Attending: Hematology | Admitting: Hematology

## 2017-06-19 DIAGNOSIS — C642 Malignant neoplasm of left kidney, except renal pelvis: Secondary | ICD-10-CM | POA: Insufficient documentation

## 2017-06-19 DIAGNOSIS — C7951 Secondary malignant neoplasm of bone: Secondary | ICD-10-CM | POA: Diagnosis present

## 2017-06-19 DIAGNOSIS — K8020 Calculus of gallbladder without cholecystitis without obstruction: Secondary | ICD-10-CM | POA: Insufficient documentation

## 2017-06-19 DIAGNOSIS — I2510 Atherosclerotic heart disease of native coronary artery without angina pectoris: Secondary | ICD-10-CM | POA: Diagnosis not present

## 2017-06-19 DIAGNOSIS — I700 Atherosclerosis of aorta: Secondary | ICD-10-CM | POA: Diagnosis not present

## 2017-06-19 DIAGNOSIS — R911 Solitary pulmonary nodule: Secondary | ICD-10-CM | POA: Insufficient documentation

## 2017-06-19 LAB — GLUCOSE, CAPILLARY: Glucose-Capillary: 78 mg/dL (ref 65–99)

## 2017-06-19 MED ORDER — FLUDEOXYGLUCOSE F - 18 (FDG) INJECTION
6.5000 | Freq: Once | INTRAVENOUS | Status: AC | PRN
  Administered 2017-06-19: 6.5 via INTRAVENOUS

## 2017-06-25 NOTE — Progress Notes (Signed)
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 The patient's last visit with [name] was on 05/14/17. She is accompanied today by her son. The pt reports that she is doing well overall.  No further issues with nausea which was quite bothersome and needed hospitalization and was likely related to a stomach bug.  She notes that she is eating well, her nausea has subsided, and reports loose stools but denies diarrhea. She notes that she has to move her bowels 4-5 times each day and notes that her hemorrhoids are painful, and are bleeding some. She notes that she has started witch hazel for her hemorrhoids and notes no pain. She will be discussing this with her PCP later this month.   She notes that her back pain has not worsened and may be getting a little better.   She notes that she will call us if she is close to running out of her Sutent and currently has refills.  Of note since the patient's last visit, pt has had PET Skull base to thigh completed on 06/19/17 with results revealing 1. Partial metabolic response. Hypermetabolic T7 vertebral osseous lesion is significantly decreased in metabolism. No new or progressive hypermetabolic sites of metastatic disease. 2. Tiny 3 mm right lower lobe pulmonary nodule is decreased in size, suggesting partial treatment response of a pulmonary metastasis. Right middle lobe 5 mm nodule is stable. These nodules are below PET Resolution. 3. Chronic findings include: Aortic Atherosclerosis (ICD10-I70.0). Coronary atherosclerosis.  Cholelithiasis.  Lab results today (06/27/17) of CBC and Reticulocytes is as follows: all values are WNL except for RBC at 2.73, Hgb at 9.5, HCT at 29.9, MCV at 109.5, MCH at 34.8, RDW at 16. LDH 06/27/17 is 140 wnl CMP 06/27/17 is shows stable renal function.  On review of systems, pt reports resolved left shoulder pain, resolved nausea, loose stools, and denies worsening back pain, pain along the spine, diarrhea, and any other symptoms.    MEDICAL HISTORY:    Hypertension Dyslipidemia Osteoarthritis Ex-smoker Coronary artery disease Thyroid disorder-was apparently on levothyroxine 25 g daily which has subsequently been discontinued . 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 Earley Brooke Colonoscopy 03/2016 Dr. Earley Brooke  SOCIAL HISTORY: Social History   Socioeconomic History  . Marital status: Married    Spouse name: Not on file  . Number of children: Not on file  . Years of education: Not on file  . Highest education level: Not on file  Social Needs  . Financial resource strain: Not on file  . Food insecurity - worry: Not on file  . Food insecurity - inability: Not on file  . Transportation needs - medical: Not on file  . Transportation needs - non-medical: Not on file  Occupational History  . Not on file  Tobacco Use  . Smoking status: Former Smoker    Years: 8.00    Last attempt to quit: 06/18/1964    Years since quitting: 53.0  .  Smokeless tobacco: Never Used  Substance and Sexual Activity  . Alcohol use: No  . Drug use: No  . Sexual activity: Not on file  Other Topics Concern  . Not on file  Social History Narrative  . Not on file   Patient lives in [place] Works as a Solicitor part-time Ex-smoker quit long time ago In 1961 . Smoked 2 packs per day for 8 years prior to that .  or a lot of stress  since her daughter is also getting treated for stage IV uterine cancer.  FAMILY HISTORY:  Mother deceased Father with asthma and emphysema died at 59 years with the MI. Brother deceased of heart disease   ALLERGIES:  is allergic to adhesive [tape]; nsaids; and statins.  MEDICATIONS:  Current Outpatient Medications  Medication Sig Dispense Refill  . acetaminophen (TYLENOL) 500 MG tablet Take 1,000 mg by mouth 2 (two) times daily.     Marland Kitchen atorvastatin (LIPITOR) 40 MG tablet Take 40 mg by mouth daily at 6 PM.     . clobetasol ointment (TEMOVATE) 8.91 % Apply 1 application topically 2 (two) times daily.    . furosemide (LASIX) 20 MG tablet Take 20 mg by mouth daily.    Marland Kitchen glimepiride (AMARYL) 1 MG tablet Take 1 mg by mouth every evening.     . hydrocortisone (CORTEF) 10 MG tablet 10mg  po with breakfast and 5 mg po with lunch (Patient taking differently: 10mg  po with breakfast) 90 tablet 1  . levothyroxine (SYNTHROID, LEVOTHROID) 25 MCG tablet Take 25 mcg by mouth daily before breakfast.    . lisinopril (PRINIVIL,ZESTRIL) 40 MG tablet Take 40 mg by mouth every evening.     . magic mouthwash w/lidocaine SOLN Take 5 mLs by mouth 4 (four) times daily as needed for mouth pain. Swish and spit. (1 Part viscous lidocaine 2% 1 Part Maalox 1 Part diphenhydramine 12.5 mg per 50mL elixir) 120 mL 1  . ondansetron (ZOFRAN) 4 MG tablet Take 1 tablet (4 mg total) by mouth every 8 (eight) hours as needed for nausea. 30 tablet 2  . prochlorperazine (COMPAZINE) 10 MG tablet TAKE 1 TABLET BY MOUTH EVERY 6 HOURS AS NEEDED FOR NAUSEA OR VOMITING 30 tablet 2  . SUNItinib (SUTENT) 37.5 MG capsule 37.5 mg po daily 2 weeks on 1 week off 28 capsule 1   No current facility-administered medications for this visit.     REVIEW OF SYSTEMS:    .10 Point review of Systems was done is negative except as noted above.  PHYSICAL EXAMINATION:  ECOG PERFORMANCE STATUS: 1 - Symptomatic but completely ambulatory  . Vitals:    06/27/17 0919  BP: (!) 141/69  Pulse: 81  Resp: 18  Temp: 97.9 F (36.6 C)  SpO2: 100%   Filed Weights   06/27/17 0919  Weight: 134 lb 8 oz (61 kg)   .Body mass index is 27.17 kg/m.  Marland Kitchen GENERAL:alert, in no acute distress and comfortable SKIN: no acute rashes, no significant lesions EYES: conjunctiva are pink and non-injected, sclera anicteric OROPHARYNX: MMM, no exudates, no oropharyngeal erythema or ulceration NECK: supple, no JVD LYMPH:  no palpable lymphadenopathy in the cervical, axillary or inguinal regions LUNGS: clear to auscultation b/l with normal respiratory effort HEART: regular rate & rhythm ABDOMEN:  normoactive bowel sounds , non tender, not distended. Extremity: trace b/l pedal edema PSYCH: alert & oriented x 3 with fluent speech NEURO: no focal motor/sensory deficits     LABORATORY DATA:  I have  reviewed the data as listed  . CBC Latest Ref Rng & Units 06/27/2017 05/14/2017 04/04/2017  WBC 3.9 - 10.3 K/uL 6.4 6.3 4.5  Hemoglobin 11.6 - 15.9 g/dL - - 11.2(L)  Hematocrit 34.8 - 46.6 % 29.9(L) 28.0(L) 35.2  Platelets 145 - 400 K/uL 241 210 250  hgb  9.5 CBC    Component Value Date/Time   WBC 6.4 06/27/2017 0851   WBC 4.5 04/04/2017 0830   WBC 10.7 (H) 07/29/2016 1526   RBC 2.73 (L) 06/27/2017 0851   RBC 2.73 (L) 06/27/2017 0851   HGB 11.2 (L) 04/04/2017 0830   HCT 29.9 (L) 06/27/2017 0851   HCT 35.2 04/04/2017 0830   PLT 241 06/27/2017 0851   PLT 250 04/04/2017 0830   MCV 109.5 (H) 06/27/2017 0851   MCV 107.6 (H) 04/04/2017 0830   MCH 34.8 (H) 06/27/2017 0851   MCHC 31.8 06/27/2017 0851   RDW 16.0 (H) 06/27/2017 0851   RDW 14.0 04/04/2017 0830   LYMPHSABS 1.2 06/27/2017 0851   LYMPHSABS 1.6 04/04/2017 0830   MONOABS 0.5 06/27/2017 0851   MONOABS 0.4 04/04/2017 0830   EOSABS 0.2 06/27/2017 0851   EOSABS 0.1 04/04/2017 0830   BASOSABS 0.0 06/27/2017 0851   BASOSABS 0.0 04/04/2017 0830     . CMP Latest Ref Rng & Units 05/14/2017  04/04/2017 02/21/2017  Glucose 70 - 140 mg/dL 70 107 98  BUN 7 - 26 mg/dL 23 37.0(H) 28.3(H)  Creatinine 0.60 - 1.10 mg/dL 1.56(H) 2.2(H) 1.9(H)  Sodium 136 - 145 mmol/L 137 140 136  Potassium 3.3 - 4.7 mmol/L 4.9(H) 4.1 3.7  Chloride 98 - 109 mmol/L 108 - -  CO2 22 - 29 mmol/L 20(L) 21(L) 24  Calcium 8.4 - 10.4 mg/dL 9.0 10.1 8.7  Total Protein 6.4 - 8.3 g/dL 6.9 7.5 7.2  Total Bilirubin 0.2 - 1.2 mg/dL 0.4 0.34 0.55  Alkaline Phos 40 - 150 U/L 49 65 54  AST 5 - 34 U/L 14 19 20   ALT 0 - 55 U/L 11 12 13    B12 level 299 (OSH)-->455        RADIOGRAPHIC STUDIES: I have personally reviewed the radiological images as listed and agreed with the findings in the report. Nm Pet Image Restag (ps) Skull Base To Thigh  Result Date: 01/03/2017 IMPRESSION: 1. There is a new lesion in the hepatic dome which is FDG avid and low in attenuation on CT imaging consistent with metastatic disease. Another region of uptake in the left hepatic lobe posteriorly demonstrates no CT correlate. A second subtle metastasis is not excluded on today's study. An MRI could better assess for other hepatic metastases if clinically warranted. 2. New metastatic lesion in the left side of T8. 3. New pulmonary nodule in the right upper lobe. This is too small to characterize but suspicious. Recommend attention on follow-up. No FDG avid nodules or other enlarging nodules. 4. The uptake at the previous left renal artery has almost resolved in the interval and is favored to be post therapeutic. Recommend attention on follow-up. 5. The metastasis in the posterior right hilum seen previously has resolved. Electronically Signed   By: Dorise Bullion III M.D   On: 01/03/2017 11:16   .Nm Pet Image Restag (ps) Skull Base To Thigh  Result Date: 06/19/2017 CLINICAL DATA:  Subsequent treatment strategy for metastatic left renal cell carcinoma. History of left nephrectomy. EXAM: NUCLEAR MEDICINE PET SKULL BASE TO THIGH TECHNIQUE: 6.5 mCi  F-18 FDG was injected intravenously. Full-ring PET  imaging was performed from the skull base to thigh after the radiotracer. CT data was obtained and used for attenuation correction and anatomic localization. Fasting blood glucose: 78 mg/dl Mediastinal blood pool activity: SUV max 2.4 COMPARISON:  03/28/2017 PET-CT. FINDINGS: NECK: No hypermetabolic lymph nodes in the neck. Symmetric hypermetabolism in nasopharynx, oropharynx and larynx without CT correlate, probably physiologic. Incidental CT findings: Mild mucoperiosteal thickening/mucous retention cyst in the dependent left maxillary sinus. Non hypermetabolic subcentimeter calcified right thyroid lobe nodule. CHEST: No hypermetabolic axillary, mediastinal or hilar lymph nodes. No hypermetabolic pulmonary nodules. Incidental CT findings: No acute consolidative airspace disease or lung masses. Lateral segment right middle lobe 5 mm pulmonary nodule is stable and below PET resolution (series 8/image 39). Peripheral right lower lobe 3 mm pulmonary nodule (series 8/image 26) is below PET resolution and decreased in size from 6 mm. No new significant pulmonary nodules. No pneumothorax. No pleural effusions. Coronary atherosclerosis. Atherosclerotic nonaneurysmal thoracic aorta. ABDOMEN/PELVIS: No abnormal hypermetabolic activity within the liver, pancreas, adrenal glands, or spleen. No hypermetabolic lymph nodes in the abdomen or pelvis. Incidental CT findings: Status post left nephrectomy and left adrenalectomy, with no recurrent mass in surgical beds. Cholelithiasis. Stable 1.3 cm right adrenal nodule, non hypermetabolic. Stable parapelvic right renal cysts. Stable hyperdense 0.9 cm renal cortical lesion in the posterior upper right kidney, probably benign. Atherosclerotic nonaneurysmal abdominal aorta. Mild scattered colonic diverticulosis. Small cystocele. SKELETON: Mixed lytic and sclerotic T7 vertebral osseous lesion demonstrates mild hypermetabolism with max SUV  4.3, previous max SUV 12.5, significantly decreased. No new focal osseous lesions. Incidental CT findings: Moderate-to-marked degenerative disc disease throughout the spine. IMPRESSION: 1. Partial metabolic response. Hypermetabolic T7 vertebral osseous lesion is significantly decreased in metabolism. No new or progressive hypermetabolic sites of metastatic disease. 2. Tiny 3 mm right lower lobe pulmonary nodule is decreased in size, suggesting partial treatment response of a pulmonary metastasis. Right middle lobe 5 mm nodule is stable. These nodules are below PET resolution. 3. Chronic findings include: Aortic Atherosclerosis (ICD10-I70.0). Coronary atherosclerosis. Cholelithiasis. Electronically Signed   By: Ilona Sorrel M.D.   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 PET/CT 06/19/2017 -- consistent with partial metabolic response to treatment.  Rt adrenal gland bx - showed clear cell RCC  S/p CYtoreductive left radical nephrectomy and left adrenal gland resection on 08/01/2016 by Dr Alinda Money.  #2 b/l adrenal metastases from [place] s/p left adrenalectomy with adrenal insuff - follows with Dr Buddy Duty.  #3 Small pulmonary lesions -- PET scan suggestive of metastases from [place] - improved on recent PET/CT 06/19/17  MRI brain shows no evidence of metastatic disease  #4 T7/8 Bone metastases - on Xgeva  #5 ?liver mets- rpt PET/CT from 06/19/2017 shows no overt evidence of metastatic disease in the liver.  #6 Grade 1 Nausea - improved, she hasn't needed to take her prescribed anti emetic medications #7 Grade 1 Diarrhea - from sutent controlled with prn Imodium. #8 Hyponatremia - sodium level - likely related to some element of adrenal insufficiency, diarrhea, limited by mouth intake. Primarily solute free fluid intake.  HCTZ use Sodium levels have normal today 137. with appropriate adjustment of her hydrocortisone dose Resolved at this time.  Plan -Discussed pt labwork today; Hgb increased to 9.5 -Discussed most recent 06/19/17 PET scan revealing a partial metabolic response with  decreased activity and no new metastasis. -Given these PET findings, local radiation may not be needed.  -continue renally dose adjusted Sutent at 37.5mg  po daily 2 weeks on 1 week off. -continue Hydrocortisone to 10mg  in AM - f/u with Dr Buddy Duty for mx of adrenal insuff and hypothyroidism. -increased po fluid hydration with electrolyte rich fluids -control mild diarrhea from Sutent with prn imodium -zofran prn for nausea. -continue with Xgeva injections q 6 weeks   #9 Hemorrhoids -chronic with some bleeing --Recommended Sitz bath and OTC Anusol or Nupercaine for her hemorrhoid relief. 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 Some element of blood loss from hemorrhoids. Thyroid function is within normal limits.  -Hgb increased to 9.5.  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 Buddy Duty  #12 HTN - ?control. Patient tends to be anxious and has higher blood pressures in the clinic. She can have increased blood pressure from Sutent as well. Plan -Patient has been taking her blood pressure every morning when she wakes up - again reiterated need for close f/u with her PCP  /cardiology regarding the many elements necessary for her care that are not directly related to her oncology care.  #13 Grade 1 mucositis- resolved -Advised the patient to continue use of Magic Mouthwash to aid with nutrition  Pneumovax today Continue [facility] RTC with Dr Irene Limbo in 6 weeks with labs  All of the patients questions were answered with apparent satisfaction. The patient knows to call the clinic with any problems, questions or concerns.  . The total time spent in the appointment was 25 minutes and more than 50% was on counseling and direct patient cares.  Sullivan Lone MD [place] AAHIVMS [facility] Hematology/Oncology Physician [place]  (Office):       [phone] (Work cell):  [phone] (Fax):           [phone]  This document serves as a record of services personally performed by Sullivan Lone, MD. It was created on his behalf by Baldwin [place], a trained medical scribe. The creation of this record is based on the scribe's personal observations and the provider's statements to them.   .I have reviewed the above documentation for accuracy and completeness, and I agree with the above. Brunetta Genera MD MS

## 2017-06-27 ENCOUNTER — Inpatient Hospital Stay: Payer: Medicare Other | Attending: Hematology

## 2017-06-27 ENCOUNTER — Inpatient Hospital Stay: Payer: Medicare Other

## 2017-06-27 ENCOUNTER — Encounter: Payer: Self-pay | Admitting: Hematology

## 2017-06-27 ENCOUNTER — Inpatient Hospital Stay (HOSPITAL_BASED_OUTPATIENT_CLINIC_OR_DEPARTMENT_OTHER): Payer: Medicare Other | Admitting: Hematology

## 2017-06-27 ENCOUNTER — Telehealth: Payer: Self-pay | Admitting: Hematology

## 2017-06-27 VITALS — BP 141/69 | HR 81 | Temp 97.9°F | Resp 18 | Ht 59.0 in | Wt 134.5 lb

## 2017-06-27 DIAGNOSIS — C642 Malignant neoplasm of left kidney, except renal pelvis: Secondary | ICD-10-CM | POA: Insufficient documentation

## 2017-06-27 DIAGNOSIS — K449 Diaphragmatic hernia without obstruction or gangrene: Secondary | ICD-10-CM | POA: Insufficient documentation

## 2017-06-27 DIAGNOSIS — C7800 Secondary malignant neoplasm of unspecified lung: Secondary | ICD-10-CM

## 2017-06-27 DIAGNOSIS — Z923 Personal history of irradiation: Secondary | ICD-10-CM | POA: Insufficient documentation

## 2017-06-27 DIAGNOSIS — K649 Unspecified hemorrhoids: Secondary | ICD-10-CM | POA: Insufficient documentation

## 2017-06-27 DIAGNOSIS — R110 Nausea: Secondary | ICD-10-CM | POA: Diagnosis not present

## 2017-06-27 DIAGNOSIS — I2510 Atherosclerotic heart disease of native coronary artery without angina pectoris: Secondary | ICD-10-CM | POA: Insufficient documentation

## 2017-06-27 DIAGNOSIS — M549 Dorsalgia, unspecified: Secondary | ICD-10-CM | POA: Diagnosis not present

## 2017-06-27 DIAGNOSIS — E538 Deficiency of other specified B group vitamins: Secondary | ICD-10-CM | POA: Insufficient documentation

## 2017-06-27 DIAGNOSIS — Z79899 Other long term (current) drug therapy: Secondary | ICD-10-CM | POA: Insufficient documentation

## 2017-06-27 DIAGNOSIS — R197 Diarrhea, unspecified: Secondary | ICD-10-CM | POA: Insufficient documentation

## 2017-06-27 DIAGNOSIS — Z7984 Long term (current) use of oral hypoglycemic drugs: Secondary | ICD-10-CM | POA: Insufficient documentation

## 2017-06-27 DIAGNOSIS — N281 Cyst of kidney, acquired: Secondary | ICD-10-CM | POA: Insufficient documentation

## 2017-06-27 DIAGNOSIS — Z87891 Personal history of nicotine dependence: Secondary | ICD-10-CM

## 2017-06-27 DIAGNOSIS — I700 Atherosclerosis of aorta: Secondary | ICD-10-CM

## 2017-06-27 DIAGNOSIS — C7951 Secondary malignant neoplasm of bone: Secondary | ICD-10-CM | POA: Diagnosis not present

## 2017-06-27 DIAGNOSIS — E440 Moderate protein-calorie malnutrition: Secondary | ICD-10-CM

## 2017-06-27 DIAGNOSIS — Z6827 Body mass index (BMI) 27.0-27.9, adult: Secondary | ICD-10-CM

## 2017-06-27 DIAGNOSIS — M1990 Unspecified osteoarthritis, unspecified site: Secondary | ICD-10-CM

## 2017-06-27 DIAGNOSIS — Z23 Encounter for immunization: Secondary | ICD-10-CM | POA: Diagnosis not present

## 2017-06-27 DIAGNOSIS — E119 Type 2 diabetes mellitus without complications: Secondary | ICD-10-CM | POA: Insufficient documentation

## 2017-06-27 DIAGNOSIS — E2740 Unspecified adrenocortical insufficiency: Secondary | ICD-10-CM | POA: Insufficient documentation

## 2017-06-27 DIAGNOSIS — C649 Malignant neoplasm of unspecified kidney, except renal pelvis: Secondary | ICD-10-CM

## 2017-06-27 DIAGNOSIS — E039 Hypothyroidism, unspecified: Secondary | ICD-10-CM | POA: Diagnosis not present

## 2017-06-27 DIAGNOSIS — D509 Iron deficiency anemia, unspecified: Secondary | ICD-10-CM

## 2017-06-27 DIAGNOSIS — I10 Essential (primary) hypertension: Secondary | ICD-10-CM

## 2017-06-27 DIAGNOSIS — K8020 Calculus of gallbladder without cholecystitis without obstruction: Secondary | ICD-10-CM

## 2017-06-27 DIAGNOSIS — E871 Hypo-osmolality and hyponatremia: Secondary | ICD-10-CM

## 2017-06-27 DIAGNOSIS — E785 Hyperlipidemia, unspecified: Secondary | ICD-10-CM | POA: Diagnosis not present

## 2017-06-27 DIAGNOSIS — C7972 Secondary malignant neoplasm of left adrenal gland: Secondary | ICD-10-CM | POA: Insufficient documentation

## 2017-06-27 DIAGNOSIS — I252 Old myocardial infarction: Secondary | ICD-10-CM

## 2017-06-27 DIAGNOSIS — I340 Nonrheumatic mitral (valve) insufficiency: Secondary | ICD-10-CM | POA: Diagnosis not present

## 2017-06-27 DIAGNOSIS — N8110 Cystocele, unspecified: Secondary | ICD-10-CM | POA: Insufficient documentation

## 2017-06-27 LAB — CBC WITH DIFFERENTIAL (CANCER CENTER ONLY)
Basophils Absolute: 0 10*3/uL (ref 0.0–0.1)
Basophils Relative: 0 %
Eosinophils Absolute: 0.2 10*3/uL (ref 0.0–0.5)
Eosinophils Relative: 3 %
HCT: 29.9 % — ABNORMAL LOW (ref 34.8–46.6)
Hemoglobin: 9.5 g/dL — ABNORMAL LOW (ref 11.6–15.9)
Lymphocytes Relative: 18 %
Lymphs Abs: 1.2 10*3/uL (ref 0.9–3.3)
MCH: 34.8 pg — ABNORMAL HIGH (ref 25.1–34.0)
MCHC: 31.8 g/dL (ref 31.5–36.0)
MCV: 109.5 fL — ABNORMAL HIGH (ref 79.5–101.0)
Monocytes Absolute: 0.5 10*3/uL (ref 0.1–0.9)
Monocytes Relative: 8 %
Neutro Abs: 4.5 10*3/uL (ref 1.5–6.5)
Neutrophils Relative %: 71 %
Platelet Count: 241 10*3/uL (ref 145–400)
RBC: 2.73 MIL/uL — ABNORMAL LOW (ref 3.70–5.45)
RDW: 16 % — ABNORMAL HIGH (ref 11.2–14.5)
WBC Count: 6.4 10*3/uL (ref 3.9–10.3)

## 2017-06-27 LAB — CMP (CANCER CENTER ONLY)
ALT: 7 U/L (ref 0–55)
AST: 14 U/L (ref 5–34)
Albumin: 3.3 g/dL — ABNORMAL LOW (ref 3.5–5.0)
Alkaline Phosphatase: 46 U/L (ref 40–150)
Anion gap: 10 (ref 3–11)
BUN: 25 mg/dL (ref 7–26)
CO2: 17 mmol/L — ABNORMAL LOW (ref 22–29)
Calcium: 8.1 mg/dL — ABNORMAL LOW (ref 8.4–10.4)
Chloride: 109 mmol/L (ref 98–109)
Creatinine: 1.57 mg/dL — ABNORMAL HIGH (ref 0.60–1.10)
GFR, Est AFR Am: 36 mL/min — ABNORMAL LOW (ref >60)
GFR, Estimated: 31 mL/min — ABNORMAL LOW (ref >60)
Glucose, Bld: 115 mg/dL (ref 70–140)
Potassium: 4.6 mmol/L (ref 3.5–5.1)
Sodium: 136 mmol/L (ref 136–145)
Total Bilirubin: 0.3 mg/dL (ref 0.2–1.2)
Total Protein: 6.2 g/dL — ABNORMAL LOW (ref 6.4–8.3)

## 2017-06-27 LAB — RETICULOCYTES
RBC.: 2.73 MIL/uL — ABNORMAL LOW (ref 3.70–5.45)
Retic Count, Absolute: 35.5 10*3/uL (ref 33.7–90.7)
Retic Ct Pct: 1.3 % (ref 0.7–2.1)

## 2017-06-27 LAB — LACTATE DEHYDROGENASE: LDH: 140 U/L (ref 125–245)

## 2017-06-27 MED ORDER — PNEUMOCOCCAL VAC POLYVALENT 25 MCG/0.5ML IJ INJ
0.5000 mL | INJECTION | Freq: Once | INTRAMUSCULAR | Status: AC
  Administered 2017-06-27: 0.5 mL via INTRAMUSCULAR
  Filled 2017-06-27: qty 0.5

## 2017-06-27 MED ORDER — DENOSUMAB 120 MG/1.7ML ~~LOC~~ SOLN
120.0000 mg | Freq: Once | SUBCUTANEOUS | Status: AC
  Administered 2017-06-27: 120 mg via SUBCUTANEOUS

## 2017-06-27 NOTE — Patient Instructions (Signed)
Thank you for choosing [facility] to provide your oncology and hematology care.  To afford each patient quality time with our providers, please arrive 30 minutes before your scheduled appointment time.  If you arrive late for your appointment, you may be asked to reschedule.  We strive to give you quality time with our providers, and arriving late affects you and other patients whose appointments are after yours.   If you are a no show for multiple scheduled visits, you may be dismissed from the clinic at the providers discretion.    Again, thank you for choosing [facility], our hope is that these requests will decrease the amount of time that you wait before being seen by our physicians.  ______________________________________________________________________  Should you have questions after your visit to the [facility], please contact our office at [phone] between the hours of 8:30 and 4:30 p.m.    Voicemails left after 4:30p.m will not be returned until the following business day.    For prescription refill requests, please have your pharmacy contact us directly.  Please also try to allow 48 hours for prescription requests.    Please contact the scheduling department for questions regarding scheduling.  For scheduling of procedures such as PET scans, CT scans, MRI, Ultrasound, etc please contact central scheduling at [phone].    Resources For Cancer Patients and Caregivers:   Oncolink.org:  A wonderful resource for patients and healthcare providers for information regarding your disease, ways to tract your treatment, what to expect, etc.     American Cancer Society:  800-227-2345  Can help patients locate various types of support and financial assistance  Cancer Care: 1-800-813-HOPE (4673) Provides financial assistance, online support groups, medication/co-pay assistance.    [place] DSS:  336-641-3447 Where to apply for food  stamps, Medicaid, and utility assistance  Medicare Rights Center: 800-333-4114 Helps people with Medicare understand their rights and benefits, navigate the Medicare system, and secure the quality healthcare they deserve  SCAT: 336-333-6589 [place] [employer]'s shared-ride transportation service for eligible riders who have a disability that prevents them from riding the fixed route bus.    For additional information on assistance programs please contact our social worker:   Grier Hock/Abigail Elmore:  [phone]            

## 2017-06-27 NOTE — Telephone Encounter (Signed)
Gave patient avs and calendar with appts per 3/8 los

## 2017-06-27 NOTE — Patient Instructions (Signed)

## 2017-06-28 MED ORDER — SUNITINIB MALATE 37.5 MG PO CAPS: ORAL_CAPSULE | ORAL | 3 refills | Status: DC

## 2017-07-07 ENCOUNTER — Other Ambulatory Visit: Payer: Self-pay | Admitting: Hematology

## 2017-07-18 ENCOUNTER — Telehealth: Payer: Self-pay

## 2017-07-18 NOTE — Telephone Encounter (Signed)
Pt additionally told to hold Sutent for one week and call before restarting. D/t history, pt should also see PCP to determine if symptoms may additionally be related to other conditions. Pt verbalized understanding.

## 2017-07-18 NOTE — Telephone Encounter (Signed)
Pt called in with c/o nausea and rawness in mouth. Pt was prescribed magic mouthwash, but no relief from mouth pain. Pt not eating much and is drinking, but experiencing dizziness upon standing and transfer. Encouraged pt that she could come to symptom management today to be seen and labs evaluated. Concern at this time r/t dehydration and malnutrition based on pt symptoms and lack of intake. Pt refused to come in and said, "If I get really bad off I will call and come in." Verbalized that there is concern of how pt is feeling now and that the cancer center will be closed over the weekend. If pt needs help she will need to go to the ED. Pt verbalized understanding. Since pt unwilling to be seen today, encouraged her to try to drink ensure and gatorade to boost calories and electrolytes as pt stated, "I am barely eating anything."   Discussed treatment for pt symptoms. Per MD, pt can mix 1 tsp baking soda, 1 tsp salt, and 0.5L water. Directions to swish and spit up to 5 times daily. Pt should not swallow mixture. Pt confirmed that she still has prescriptions for compazine and zofran. Encouraged to take them as ordered to tx her nausea and increase appetite.   Pt verbalized understanding of all instructions.

## 2017-07-31 NOTE — Progress Notes (Signed)
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 The patient's last visit with [name] was on 06/27/17. She is accompanied today by her daughter. The pt reports that she is doing well overall.   The pt reports that she has lost 12 pounds since our last visit and has had substantial nausea as well. She notes that she has not been eating as well and has been dehydrated.   She notes that she has been taking Zofran prn. She notes that she has been dry heaving more than vomiting. She notes that she takes Sutent in the evenings. She continues taking her Cortisone and has not been taking Lasix.  She notes that she has frequent heartburn and has been taking an acid suppressant. She notes that she will contact her cardiologist soon.    She notes that she has tremors.   Lab results today (07/31/17) of CBC, CMP, and Reticulocytes is as follows: all values are WNL except for RBC at 2.84, Hgb at 9.7, HCT at 30.2, MCV at 106.3, MCH at 34.2, RDW at 14.6, Retic Ct Abs at 25.6, Sodium at 134, Potassium at 5.9, CO2 at 17, Glucose at 69, BUN at 41, Creatinine at 2.52.  On review of systems, pt reports nausea, weakness, weight loss, decreased appetite, decreased fluid intake, and denies abdominal pains, and any other symptoms.   MEDICAL HISTORY:    Hypertension Dyslipidemia Osteoarthritis Ex-smoker Coronary artery disease Thyroid disorder-was apparently on levothyroxine 25 g daily which has subsequently been discontinued . Diabetes Mitral regurgitation B12 deficiency  hiatal  hernia with esophagitis  Myocardial infarction in 1991 no interventions   SURGICAL HISTORY: No reported past surgeries EGD 05/01/2016 Dr. Earley Brooke Colonoscopy 03/2016 Dr. Earley Brooke  SOCIAL HISTORY: Social History   Socioeconomic History  . Marital status: Married    Spouse name: Not on file  . Number of children: Not on file  . Years of education: Not on file  . Highest education level: Not on file  Occupational History  . Not on file  Social Needs  . Financial resource strain: Not on file  . Food insecurity:    Worry: Not on file    Inability: Not on file  . Transportation needs:    Medical: Not on file    Non-medical: Not on file  Tobacco Use  . Smoking status: Former Smoker    Years: 8.00    Last attempt to quit: 06/18/1964    Years since quitting: 53.1  . Smokeless tobacco: Never Used  Substance and Sexual Activity  . Alcohol use: No  . Drug use: No  . Sexual activity: Not on file  Lifestyle  . Physical activity:    Days per week: Not on file    Minutes per session: Not on file  . Stress: Not on file  Relationships  . Social connections:    Talks on phone: Not on file    Gets together: Not on file    Attends religious service: Not on file    Active member of club or organization: Not on file  Attends meetings of clubs or organizations: Not on file    Relationship status: Not on file  . Intimate partner violence:    Fear of current or ex partner: Not on file    Emotionally abused: Not on file    Physically abused: Not on file    Forced sexual activity: Not on file  Other Topics Concern  . Not on file  Social History Narrative  . Not on file   Patient lives in [place] Works as a Solicitor part-time Ex-smoker quit long time ago In 1961 . Smoked 2 packs per day for 8 years prior to that .  or a lot of stress since her daughter is also getting treated for stage IV uterine cancer.  FAMILY HISTORY:  Mother deceased Father with asthma and  emphysema died at 33 years with the MI. Brother deceased of heart disease   ALLERGIES:  is allergic to adhesive [tape]; nsaids; and statins.  MEDICATIONS:  Current Outpatient Medications  Medication Sig Dispense Refill  . acetaminophen (TYLENOL) 500 MG tablet Take 1,000 mg by mouth 2 (two) times daily.     Marland Kitchen atorvastatin (LIPITOR) 40 MG tablet Take 40 mg by mouth daily at 6 PM.     . clobetasol ointment (TEMOVATE) 6.65 % Apply 1 application topically 2 (two) times daily.    . furosemide (LASIX) 20 MG tablet Take 20 mg by mouth daily.    Marland Kitchen glimepiride (AMARYL) 1 MG tablet Take 1 mg by mouth every evening.     . hydrocortisone (CORTEF) 10 MG tablet 10mg  po with breakfast and 5 mg po with lunch (Patient taking differently: 10mg  po with breakfast) 90 tablet 1  . hydrocortisone (CORTEF) 10 MG tablet TAKE 2 TABLETS BY MOUTH ONCE DAILY WITH BREAKFAST AND THEN TAKE 1 ONCE DAILY WITH LUNCH 90 tablet 1  . levothyroxine (SYNTHROID, LEVOTHROID) 25 MCG tablet Take 25 mcg by mouth daily before breakfast.    . lisinopril (PRINIVIL,ZESTRIL) 40 MG tablet Take 40 mg by mouth every evening.     . magic mouthwash w/lidocaine SOLN Take 5 mLs by mouth 4 (four) times daily as needed for mouth pain. Swish and spit. (1 Part viscous lidocaine 2% 1 Part Maalox 1 Part diphenhydramine 12.5 mg per 33mL elixir) 120 mL 1  . ondansetron (ZOFRAN) 4 MG tablet Take 1 tablet (4 mg total) by mouth every 8 (eight) hours as needed for nausea. 30 tablet 2  . prochlorperazine (COMPAZINE) 10 MG tablet TAKE 1 TABLET BY MOUTH EVERY 6 HOURS AS NEEDED FOR NAUSEA OR VOMITING 30 tablet 2  . SUNItinib (SUTENT) 37.5 MG capsule 37.5 mg po daily 2 weeks on 1 week off 28 capsule 3   No current facility-administered medications for this visit.     REVIEW OF SYSTEMS:   .10 Point review of Systems was done is negative except as noted above.  PHYSICAL EXAMINATION:  ECOG PERFORMANCE STATUS: 1 - Symptomatic but completely ambulatory  . Vitals:    08/01/17 0858  BP: (!) 148/60  Pulse: 78  Resp: 16  Temp: (!) 97.5 F (36.4 C)  SpO2: 100%   Filed Weights   08/01/17 0858  Weight: 123 lb 8 oz (56 kg)   .Body mass index is 24.94 kg/m.  Marland Kitchen GENERAL:alert, in no acute distress and comfortable SKIN: no acute rashes, no significant lesions EYES: conjunctiva are pink and non-injected, sclera anicteric OROPHARYNX: MMM, no exudates, no oropharyngeal erythema or ulceration NECK: supple, no JVD LYMPH:  no palpable  lymphadenopathy in the cervical, axillary or inguinal regions LUNGS: clear to auscultation b/l with normal respiratory effort HEART: regular rate & rhythm ABDOMEN:  normoactive bowel sounds , non tender, not distended. Extremity: no pedal edema PSYCH: alert & oriented x 3 with fluent speech NEURO: no focal motor/sensory deficits  LABORATORY DATA:  I have reviewed the data as listed  . CBC Latest Ref Rng & Units 08/01/2017 06/27/2017 05/14/2017  WBC 3.9 - 10.3 K/uL 6.9 6.4 6.3  Hemoglobin 11.6 - 15.9 g/dL 9.7(L) 9.5(L) 8.9(L)  Hematocrit 34.8 - 46.6 % 30.2(L) 29.9(L) 28.0(L)  Platelets 145 - 400 K/uL 204 241 210    CBC    Component Value Date/Time   WBC 6.9 08/01/2017 0832   WBC 4.5 04/04/2017 0830   WBC 10.7 (H) 07/29/2016 1526   RBC 2.84 (L) 08/01/2017 0832   RBC 2.84 (L) 08/01/2017 0832   HGB 9.7 (L) 08/01/2017 0832   HGB 11.2 (L) 04/04/2017 0830   HCT 30.2 (L) 08/01/2017 0832   HCT 35.2 04/04/2017 0830   PLT 204 08/01/2017 0832   PLT 250 04/04/2017 0830   MCV 106.3 (H) 08/01/2017 0832   MCV 107.6 (H) 04/04/2017 0830   MCH 34.2 (H) 08/01/2017 0832   MCHC 32.1 08/01/2017 0832   RDW 14.6 (H) 08/01/2017 0832   RDW 14.0 04/04/2017 0830   LYMPHSABS 1.8 08/01/2017 0832   LYMPHSABS 1.6 04/04/2017 0830   MONOABS 0.6 08/01/2017 0832   MONOABS 0.4 04/04/2017 0830   EOSABS 0.2 08/01/2017 0832   EOSABS 0.1 04/04/2017 0830   BASOSABS 0.0 08/01/2017 0832   BASOSABS 0.0 04/04/2017 0830     . CMP Latest Ref  Rng & Units 08/01/2017 06/27/2017 05/14/2017  Glucose 70 - 140 mg/dL 69(L) 115 70  BUN 7 - 26 mg/dL 41(H) 25 23  Creatinine 0.60 - 1.10 mg/dL 2.52(H) 1.57(H) 1.56(H)  Sodium 136 - 145 mmol/L 134(L) 136 137  Potassium 3.5 - 5.1 mmol/L 5.9(H) 4.6 4.9(H)  Chloride 98 - 109 mmol/L 107 109 108  CO2 22 - 29 mmol/L 17(L) 17(L) 20(L)  Calcium 8.4 - 10.4 mg/dL 9.4 8.1(L) 9.0  Total Protein 6.4 - 8.3 g/dL 7.2 6.2(L) 6.9  Total Bilirubin 0.2 - 1.2 mg/dL 0.3 0.3 0.4  Alkaline Phos 40 - 150 U/L 47 46 49  AST 5 - 34 U/L 18 14 14   ALT 0 - 55 U/L 9 7 11    B12 level 299 (OSH)-->455  . Lab Results  Component Value Date   LDH 188 08/01/2017         RADIOGRAPHIC STUDIES: I have personally reviewed the radiological images as listed and agreed with the findings in the report. Nm Pet Image Restag (ps) Skull Base To Thigh  Result Date: 01/03/2017 IMPRESSION: 1. There is a new lesion in the hepatic dome which is FDG avid and low in attenuation on CT imaging consistent with metastatic disease. Another region of uptake in the left hepatic lobe posteriorly demonstrates no CT correlate. A second subtle metastasis is not excluded on today's study. An MRI could better assess for other hepatic metastases if clinically warranted. 2. New metastatic lesion in the left side of T8. 3. New pulmonary nodule in the right upper lobe. This is too small to characterize but suspicious. Recommend attention on follow-up. No FDG avid nodules or other enlarging nodules. 4. The uptake at the previous left renal artery has almost resolved in the interval and is favored to be post therapeutic. Recommend attention on follow-up. 5.  The metastasis in the posterior right hilum seen previously has resolved. Electronically Signed   By: Dorise Bullion III M.D   On: 01/03/2017 11:16   .No results found.  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 PET/CT 06/19/2017 -- consistent with partial metabolic response to treatment.  Rt adrenal gland bx - showed clear cell RCC  S/p CYtoreductive left radical nephrectomy and left adrenal gland resection on 08/01/2016 by Dr Alinda Money.  #2 b/l adrenal metastases from [place] s/p left adrenalectomy with adrenal insuff - follows with Dr Buddy Duty.  #3 Small pulmonary lesions -- PET scan suggestive of metastases from [place] - improved on recent PET/CT 06/19/17  MRI brain shows no evidence of metastatic disease  #4 T7/8 Bone metastases - on Xgeva  #5 ?liver mets- rpt PET/CT from 06/19/2017 shows no overt evidence of metastatic disease in the liver.  #6 Grade 1 Nausea - improved and intermittent. Hasnt used her anti-emetic as instructed so some nausea and decreased po intake.  #7 Grade 1 Diarrhea - from sutent controlled with prn Imodium.  #8 Hyponatremia nearly resolved sodium level 134 - likely related to some element of adrenal insufficiency, diarrhea, limited by mouth intake. Primarily solute free fluid intake.   #9 Acute on chronic renal insufficiency Creatine up to 2.52 - due to poor po intake and dehydration and ?ACEI  #10 Hyperkalemia due to ACEI + RF  Plan --Discussed pt labwork today 08/01/17; potassium is higher at 5.9 and creatinine is elevated at 2.52, BUN is elevated at 41. Blood counts are stable.  -Hold Sutent and Lisinopril in context of current dehydration and ACEI in thr setting of AKI and hyperkalemia. -Discussed that the patient is very dehydrated today, in the setting of kidney failure and we shall give her 1L of NS for IVF -continue Hydrocortisone to 10mg  in AM - f/u with Dr Buddy Duty for mx of adrenal insuff and hypothyroidism. -increase po fluid hydration -zofran prn for nausea. -continue with Xgeva injections q 6 weeks  -Discussed with the pt that she needs to consult her PCP to help manage and co-ordinate mx for her  multiple medical co-morbidities. -Counseled the pt on the importance of following up with labs next week to re-check her Potassium and Creatinine numbers. She will see her cardiologist next week to follow up.  -We will administer a liter of fluids today.  -Will see pt back in 2-3 weeks.    #9 Hemorrhoids -chronic with some bleeing --Recommended Sitz bath and OTC Anusol or Nupercaine for her hemorrhoid relief. 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 Some element of blood loss from hemorrhoids. Thyroid function is within normal limits.  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 Buddy Duty  #12 HTN - ?control. Patient tends to be anxious and has higher blood pressures in the  clinic. She can have increased blood pressure from Sutent as well. Plan -Patient has been taking her blood pressure every morning when she wakes up - again reiterated need for close f/u with her PCP /cardiology regarding the many elements necessary for her care that are not directly related to her oncology care. -ACEI held due to AKI and hyperkalemia - will f/u with cards to help further mx this  #13 Grade 1 mucositis- resolved -Advised the patient to continue use of Magic Mouthwash to aid with nutrition  IV NS 1 liter over 1hour today RTC with Dr Irene Limbo in 3 weeks with labs F/u with PCP /Dr Sabra Heck in 3-4 days for rpt labs to monitor renal function and  potassium   All of the patients questions were answered with apparent satisfaction. The patient knows to call the clinic with any problems, questions or concerns.  . The total time spent in the appointment was 25 minutes and more than 50% was on counseling and direct patient cares.    Sullivan Lone MD [place] AAHIVMS [facility] Hematology/Oncology Physician [place]  (Office):       [phone] (Work cell):  [phone] (Fax):           [phone]  This document serves as a record of services personally performed by Sullivan Lone, MD. It was created on his behalf by Baldwin Jamaica, a trained medical scribe. The creation of this record is based on the scribe's personal observations and the provider's statements to them.   .I have reviewed the above documentation for accuracy and completeness, and I agree with the above. Brunetta Genera MD MS

## 2017-08-01 ENCOUNTER — Inpatient Hospital Stay: Payer: Medicare Other | Attending: Hematology | Admitting: Hematology

## 2017-08-01 ENCOUNTER — Inpatient Hospital Stay: Payer: Medicare Other

## 2017-08-01 ENCOUNTER — Encounter: Payer: Self-pay | Admitting: Hematology

## 2017-08-01 ENCOUNTER — Telehealth: Payer: Self-pay | Admitting: Hematology

## 2017-08-01 VITALS — BP 148/60 | HR 78 | Temp 97.5°F | Resp 16 | Ht 59.0 in | Wt 123.5 lb

## 2017-08-01 DIAGNOSIS — E119 Type 2 diabetes mellitus without complications: Secondary | ICD-10-CM | POA: Diagnosis not present

## 2017-08-01 DIAGNOSIS — R110 Nausea: Secondary | ICD-10-CM | POA: Insufficient documentation

## 2017-08-01 DIAGNOSIS — E785 Hyperlipidemia, unspecified: Secondary | ICD-10-CM | POA: Diagnosis not present

## 2017-08-01 DIAGNOSIS — K649 Unspecified hemorrhoids: Secondary | ICD-10-CM

## 2017-08-01 DIAGNOSIS — C7951 Secondary malignant neoplasm of bone: Secondary | ICD-10-CM | POA: Diagnosis not present

## 2017-08-01 DIAGNOSIS — R634 Abnormal weight loss: Secondary | ICD-10-CM | POA: Insufficient documentation

## 2017-08-01 DIAGNOSIS — I252 Old myocardial infarction: Secondary | ICD-10-CM | POA: Diagnosis not present

## 2017-08-01 DIAGNOSIS — I10 Essential (primary) hypertension: Secondary | ICD-10-CM | POA: Insufficient documentation

## 2017-08-01 DIAGNOSIS — K449 Diaphragmatic hernia without obstruction or gangrene: Secondary | ICD-10-CM | POA: Insufficient documentation

## 2017-08-01 DIAGNOSIS — C787 Secondary malignant neoplasm of liver and intrahepatic bile duct: Secondary | ICD-10-CM | POA: Insufficient documentation

## 2017-08-01 DIAGNOSIS — T464X5A Adverse effect of angiotensin-converting-enzyme inhibitors, initial encounter: Secondary | ICD-10-CM | POA: Insufficient documentation

## 2017-08-01 DIAGNOSIS — I2510 Atherosclerotic heart disease of native coronary artery without angina pectoris: Secondary | ICD-10-CM | POA: Diagnosis not present

## 2017-08-01 DIAGNOSIS — E860 Dehydration: Secondary | ICD-10-CM | POA: Diagnosis not present

## 2017-08-01 DIAGNOSIS — R197 Diarrhea, unspecified: Secondary | ICD-10-CM | POA: Insufficient documentation

## 2017-08-01 DIAGNOSIS — M1990 Unspecified osteoarthritis, unspecified site: Secondary | ICD-10-CM | POA: Diagnosis not present

## 2017-08-01 DIAGNOSIS — R630 Anorexia: Secondary | ICD-10-CM | POA: Diagnosis not present

## 2017-08-01 DIAGNOSIS — E875 Hyperkalemia: Secondary | ICD-10-CM | POA: Insufficient documentation

## 2017-08-01 DIAGNOSIS — I129 Hypertensive chronic kidney disease with stage 1 through stage 4 chronic kidney disease, or unspecified chronic kidney disease: Secondary | ICD-10-CM | POA: Diagnosis not present

## 2017-08-01 DIAGNOSIS — Z79899 Other long term (current) drug therapy: Secondary | ICD-10-CM

## 2017-08-01 DIAGNOSIS — Z87891 Personal history of nicotine dependence: Secondary | ICD-10-CM

## 2017-08-01 DIAGNOSIS — Z905 Acquired absence of kidney: Secondary | ICD-10-CM | POA: Insufficient documentation

## 2017-08-01 DIAGNOSIS — N179 Acute kidney failure, unspecified: Secondary | ICD-10-CM | POA: Diagnosis not present

## 2017-08-01 DIAGNOSIS — C642 Malignant neoplasm of left kidney, except renal pelvis: Secondary | ICD-10-CM | POA: Diagnosis present

## 2017-08-01 DIAGNOSIS — E538 Deficiency of other specified B group vitamins: Secondary | ICD-10-CM | POA: Diagnosis not present

## 2017-08-01 DIAGNOSIS — C649 Malignant neoplasm of unspecified kidney, except renal pelvis: Secondary | ICD-10-CM

## 2017-08-01 DIAGNOSIS — C7972 Secondary malignant neoplasm of left adrenal gland: Secondary | ICD-10-CM | POA: Insufficient documentation

## 2017-08-01 DIAGNOSIS — E871 Hypo-osmolality and hyponatremia: Secondary | ICD-10-CM | POA: Diagnosis not present

## 2017-08-01 DIAGNOSIS — I340 Nonrheumatic mitral (valve) insufficiency: Secondary | ICD-10-CM | POA: Diagnosis not present

## 2017-08-01 DIAGNOSIS — E2740 Unspecified adrenocortical insufficiency: Secondary | ICD-10-CM | POA: Insufficient documentation

## 2017-08-01 DIAGNOSIS — R7989 Other specified abnormal findings of blood chemistry: Secondary | ICD-10-CM

## 2017-08-01 DIAGNOSIS — D509 Iron deficiency anemia, unspecified: Secondary | ICD-10-CM | POA: Insufficient documentation

## 2017-08-01 DIAGNOSIS — E039 Hypothyroidism, unspecified: Secondary | ICD-10-CM

## 2017-08-01 DIAGNOSIS — Z9889 Other specified postprocedural states: Secondary | ICD-10-CM | POA: Insufficient documentation

## 2017-08-01 LAB — CBC WITH DIFFERENTIAL (CANCER CENTER ONLY)
Basophils Absolute: 0 10*3/uL (ref 0.0–0.1)
Basophils Relative: 0 %
Eosinophils Absolute: 0.2 10*3/uL (ref 0.0–0.5)
Eosinophils Relative: 4 %
HCT: 30.2 % — ABNORMAL LOW (ref 34.8–46.6)
Hemoglobin: 9.7 g/dL — ABNORMAL LOW (ref 11.6–15.9)
Lymphocytes Relative: 26 %
Lymphs Abs: 1.8 10*3/uL (ref 0.9–3.3)
MCH: 34.2 pg — ABNORMAL HIGH (ref 25.1–34.0)
MCHC: 32.1 g/dL (ref 31.5–36.0)
MCV: 106.3 fL — ABNORMAL HIGH (ref 79.5–101.0)
Monocytes Absolute: 0.6 10*3/uL (ref 0.1–0.9)
Monocytes Relative: 8 %
Neutro Abs: 4.3 10*3/uL (ref 1.5–6.5)
Neutrophils Relative %: 62 %
Platelet Count: 204 10*3/uL (ref 145–400)
RBC: 2.84 MIL/uL — ABNORMAL LOW (ref 3.70–5.45)
RDW: 14.6 % — ABNORMAL HIGH (ref 11.2–14.5)
WBC Count: 6.9 10*3/uL (ref 3.9–10.3)

## 2017-08-01 LAB — CMP (CANCER CENTER ONLY)
ALT: 9 U/L (ref 0–55)
AST: 18 U/L (ref 5–34)
Albumin: 4 g/dL (ref 3.5–5.0)
Alkaline Phosphatase: 47 U/L (ref 40–150)
Anion gap: 10 (ref 3–11)
BUN: 41 mg/dL — ABNORMAL HIGH (ref 7–26)
CO2: 17 mmol/L — ABNORMAL LOW (ref 22–29)
Calcium: 9.4 mg/dL (ref 8.4–10.4)
Chloride: 107 mmol/L (ref 98–109)
Creatinine: 2.52 mg/dL — ABNORMAL HIGH (ref 0.60–1.10)
GFR, Est AFR Am: 20 mL/min — ABNORMAL LOW (ref >60)
GFR, Estimated: 18 mL/min — ABNORMAL LOW (ref >60)
Glucose, Bld: 69 mg/dL — ABNORMAL LOW (ref 70–140)
Potassium: 5.9 mmol/L — ABNORMAL HIGH (ref 3.5–5.1)
Sodium: 134 mmol/L — ABNORMAL LOW (ref 136–145)
Total Bilirubin: 0.3 mg/dL (ref 0.2–1.2)
Total Protein: 7.2 g/dL (ref 6.4–8.3)

## 2017-08-01 LAB — LACTATE DEHYDROGENASE: LDH: 188 U/L (ref 125–245)

## 2017-08-01 LAB — RETICULOCYTES
RBC.: 2.84 MIL/uL — ABNORMAL LOW (ref 3.70–5.45)
Retic Count, Absolute: 25.6 10*3/uL — ABNORMAL LOW (ref 33.7–90.7)
Retic Ct Pct: 0.9 % (ref 0.7–2.1)

## 2017-08-01 MED ORDER — DENOSUMAB 120 MG/1.7ML ~~LOC~~ SOLN
SUBCUTANEOUS | Status: AC
  Filled 2017-08-01: qty 1.7

## 2017-08-01 MED ORDER — DENOSUMAB 120 MG/1.7ML ~~LOC~~ SOLN: 120.0000 mg | Freq: Once | SUBCUTANEOUS | Status: DC

## 2017-08-01 MED ORDER — SODIUM CHLORIDE 0.9 % IV SOLN
Freq: Once | INTRAVENOUS | Status: AC
  Administered 2017-08-01: 11:00:00 via INTRAVENOUS

## 2017-08-01 NOTE — Patient Instructions (Signed)
Dehydration, Adult Dehydration is when there is not enough fluid or water in your body. This happens when you lose more fluids than you take in. Dehydration can range from mild to very bad. It should be treated right away to keep it from getting very bad. Symptoms of mild dehydration may include:  Thirst.  Dry lips.  Slightly dry mouth.  Dry, warm skin.  Dizziness. Symptoms of moderate dehydration may include:  Very dry mouth.  Muscle cramps.  Dark pee (urine). Pee may be the color of tea.  Your body making less pee.  Your eyes making fewer tears.  Heartbeat that is uneven or faster than normal (palpitations).  Headache.  Light-headedness, especially when you stand up from sitting.  Fainting (syncope). Symptoms of very bad dehydration may include:  Changes in skin, such as: ? Cold and clammy skin. ? Blotchy (mottled) or pale skin. ? Skin that does not quickly return to normal after being lightly pinched and let go (poor skin turgor).  Changes in body fluids, such as: ? Feeling very thirsty. ? Your eyes making fewer tears. ? Not sweating when body temperature is high, such as in hot weather. ? Your body making very little pee.  Changes in vital signs, such as: ? Weak pulse. ? Pulse that is more than 100 beats a minute when you are sitting still. ? Fast breathing. ? Low blood pressure.  Other changes, such as: ? Sunken eyes. ? Cold hands and feet. ? Confusion. ? Lack of energy (lethargy). ? Trouble waking up from sleep. ? Short-term weight loss. ? Unconsciousness. Follow these instructions at home:  If told by your doctor, drink an ORS: ? Make an ORS by using instructions on the package. ? Start by drinking small amounts, about  cup (120 mL) every 5-10 minutes. ? Slowly drink more until you have had the amount that your doctor said to have.  Drink enough clear fluid to keep your pee clear or pale yellow. If you were told to drink an ORS, finish the ORS  first, then start slowly drinking clear fluids. Drink fluids such as: ? Water. Do not drink only water by itself. Doing that can make the salt (sodium) level in your body get too low (hyponatremia). ? Ice chips. ? Fruit juice that you have added water to (diluted). ? Low-calorie sports drinks.  Avoid: ? Alcohol. ? Drinks that have a lot of sugar. These include high-calorie sports drinks, fruit juice that does not have water added, and soda. ? Caffeine. ? Foods that are greasy or have a lot of fat or sugar.  Take over-the-counter and prescription medicines only as told by your doctor.  Do not take salt tablets. Doing that can make the salt level in your body get too high (hypernatremia).  Eat foods that have minerals (electrolytes). Examples include bananas, oranges, potatoes, tomatoes, and spinach.  Keep all follow-up visits as told by your doctor. This is important. Contact a doctor if:  You have belly (abdominal) pain that: ? Gets worse. ? Stays in one area (localizes).  You have a rash.  You have a stiff neck.  You get angry or annoyed more easily than normal (irritability).  You are more sleepy than normal.  You have a harder time waking up than normal.  You feel: ? Weak. ? Dizzy. ? Very thirsty.  You have peed (urinated) only a small amount of very dark pee during 6-8 hours. Get help right away if:  You have symptoms of   very bad dehydration.  You cannot drink fluids without throwing up (vomiting).  Your symptoms get worse with treatment.  You have a fever.  You have a very bad headache.  You are throwing up or having watery poop (diarrhea) and it: ? Gets worse. ? Does not go away.  You have blood or something green (bile) in your throw-up.  You have blood in your poop (stool). This may cause poop to look black and tarry.  You have not peed in 6-8 hours.  You pass out (faint).  Your heart rate when you are sitting still is more than 100 beats a  minute.  You have trouble breathing. This information is not intended to replace advice given to you by your health care provider. Make sure you discuss any questions you have with your health care provider. Document Released: 02/02/2009 Document Revised: 10/27/2015 Document Reviewed: 06/02/2015 Elsevier Interactive Patient Education  2018 Elsevier Inc.  

## 2017-08-01 NOTE — Patient Instructions (Signed)
Thank you for choosing [facility] to provide your oncology and hematology care.  To afford each patient quality time with our providers, please arrive 30 minutes before your scheduled appointment time.  If you arrive late for your appointment, you may be asked to reschedule.  We strive to give you quality time with our providers, and arriving late affects you and other patients whose appointments are after yours.   If you are a no show for multiple scheduled visits, you may be dismissed from the clinic at the providers discretion.    Again, thank you for choosing [facility], our hope is that these requests will decrease the amount of time that you wait before being seen by our physicians.  ______________________________________________________________________  Should you have questions after your visit to the [facility], please contact our office at [phone] between the hours of 8:30 and 4:30 p.m.    Voicemails left after 4:30p.m will not be returned until the following business day.    For prescription refill requests, please have your pharmacy contact us directly.  Please also try to allow 48 hours for prescription requests.    Please contact the scheduling department for questions regarding scheduling.  For scheduling of procedures such as PET scans, CT scans, MRI, Ultrasound, etc please contact central scheduling at [phone].    Resources For Cancer Patients and Caregivers:   Oncolink.org:  A wonderful resource for patients and healthcare providers for information regarding your disease, ways to tract your treatment, what to expect, etc.     American Cancer Society:  800-227-2345  Can help patients locate various types of support and financial assistance  Cancer Care: 1-800-813-HOPE (4673) Provides financial assistance, online support groups, medication/co-pay assistance.    [place] DSS:  336-641-3447 Where to apply for food  stamps, Medicaid, and utility assistance  Medicare Rights Center: 800-333-4114 Helps people with Medicare understand their rights and benefits, navigate the Medicare system, and secure the quality healthcare they deserve  SCAT: 336-333-6589 [employer]'s shared-ride transportation service for eligible riders who have a disability that prevents them from riding the fixed route bus.    For additional information on assistance programs please contact our social worker:   Grier Hock/Abigail Elmore:  [phone]            

## 2017-08-01 NOTE — Telephone Encounter (Signed)
Scheduled appt per 4/12 los - patient is aware of appts. No print out wanted.

## 2017-08-04 ENCOUNTER — Telehealth: Payer: Self-pay | Admitting: *Deleted

## 2017-08-04 ENCOUNTER — Telehealth: Payer: Self-pay | Admitting: Hematology

## 2017-08-04 NOTE — Telephone Encounter (Signed)
"  I need to get a message to Dr. Grier Mitts nurse."

## 2017-08-04 NOTE — Telephone Encounter (Signed)
"  Patient scheduled with cardiology today at 2:30 pm.  Our office has not received information she told us to expect this past Friday.  She mentioned needing to be seen for close monitoring, increased K+, worsening BUN, d/c'd B/P medicine."

## 2017-08-04 NOTE — Telephone Encounter (Signed)
Faxed to [facility], [place] on 08/04/17, Release ID [phone]

## 2017-08-04 NOTE — Telephone Encounter (Signed)
So how can I help with this?
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
## 2017-08-22 ENCOUNTER — Telehealth: Payer: Self-pay

## 2017-08-22 ENCOUNTER — Inpatient Hospital Stay: Payer: Medicare Other

## 2017-08-22 ENCOUNTER — Encounter: Payer: Self-pay | Admitting: Hematology

## 2017-08-22 ENCOUNTER — Other Ambulatory Visit: Payer: Self-pay

## 2017-08-22 ENCOUNTER — Inpatient Hospital Stay (HOSPITAL_BASED_OUTPATIENT_CLINIC_OR_DEPARTMENT_OTHER): Payer: Medicare Other | Admitting: Hematology

## 2017-08-22 ENCOUNTER — Inpatient Hospital Stay: Payer: Medicare Other | Attending: Hematology

## 2017-08-22 VITALS — BP 149/68 | HR 72 | Temp 98.2°F | Resp 18 | Ht 59.0 in | Wt 131.7 lb

## 2017-08-22 DIAGNOSIS — E871 Hypo-osmolality and hyponatremia: Secondary | ICD-10-CM | POA: Insufficient documentation

## 2017-08-22 DIAGNOSIS — E119 Type 2 diabetes mellitus without complications: Secondary | ICD-10-CM

## 2017-08-22 DIAGNOSIS — R197 Diarrhea, unspecified: Secondary | ICD-10-CM | POA: Diagnosis not present

## 2017-08-22 DIAGNOSIS — I129 Hypertensive chronic kidney disease with stage 1 through stage 4 chronic kidney disease, or unspecified chronic kidney disease: Secondary | ICD-10-CM | POA: Diagnosis not present

## 2017-08-22 DIAGNOSIS — C7972 Secondary malignant neoplasm of left adrenal gland: Secondary | ICD-10-CM | POA: Insufficient documentation

## 2017-08-22 DIAGNOSIS — N179 Acute kidney failure, unspecified: Secondary | ICD-10-CM | POA: Insufficient documentation

## 2017-08-22 DIAGNOSIS — I2510 Atherosclerotic heart disease of native coronary artery without angina pectoris: Secondary | ICD-10-CM

## 2017-08-22 DIAGNOSIS — K449 Diaphragmatic hernia without obstruction or gangrene: Secondary | ICD-10-CM | POA: Insufficient documentation

## 2017-08-22 DIAGNOSIS — I10 Essential (primary) hypertension: Secondary | ICD-10-CM | POA: Diagnosis not present

## 2017-08-22 DIAGNOSIS — M1990 Unspecified osteoarthritis, unspecified site: Secondary | ICD-10-CM | POA: Diagnosis not present

## 2017-08-22 DIAGNOSIS — C7800 Secondary malignant neoplasm of unspecified lung: Secondary | ICD-10-CM | POA: Insufficient documentation

## 2017-08-22 DIAGNOSIS — C642 Malignant neoplasm of left kidney, except renal pelvis: Secondary | ICD-10-CM

## 2017-08-22 DIAGNOSIS — I252 Old myocardial infarction: Secondary | ICD-10-CM | POA: Insufficient documentation

## 2017-08-22 DIAGNOSIS — R109 Unspecified abdominal pain: Secondary | ICD-10-CM | POA: Diagnosis not present

## 2017-08-22 DIAGNOSIS — E2740 Unspecified adrenocortical insufficiency: Secondary | ICD-10-CM | POA: Insufficient documentation

## 2017-08-22 DIAGNOSIS — E538 Deficiency of other specified B group vitamins: Secondary | ICD-10-CM

## 2017-08-22 DIAGNOSIS — C7951 Secondary malignant neoplasm of bone: Secondary | ICD-10-CM | POA: Diagnosis not present

## 2017-08-22 DIAGNOSIS — Z87891 Personal history of nicotine dependence: Secondary | ICD-10-CM | POA: Insufficient documentation

## 2017-08-22 DIAGNOSIS — E039 Hypothyroidism, unspecified: Secondary | ICD-10-CM | POA: Diagnosis not present

## 2017-08-22 DIAGNOSIS — E875 Hyperkalemia: Secondary | ICD-10-CM | POA: Diagnosis not present

## 2017-08-22 DIAGNOSIS — E860 Dehydration: Secondary | ICD-10-CM | POA: Diagnosis not present

## 2017-08-22 DIAGNOSIS — Z79899 Other long term (current) drug therapy: Secondary | ICD-10-CM | POA: Diagnosis not present

## 2017-08-22 DIAGNOSIS — E785 Hyperlipidemia, unspecified: Secondary | ICD-10-CM | POA: Diagnosis not present

## 2017-08-22 DIAGNOSIS — E079 Disorder of thyroid, unspecified: Secondary | ICD-10-CM | POA: Diagnosis not present

## 2017-08-22 DIAGNOSIS — D649 Anemia, unspecified: Secondary | ICD-10-CM

## 2017-08-22 DIAGNOSIS — Z905 Acquired absence of kidney: Secondary | ICD-10-CM

## 2017-08-22 DIAGNOSIS — K649 Unspecified hemorrhoids: Secondary | ICD-10-CM

## 2017-08-22 DIAGNOSIS — I340 Nonrheumatic mitral (valve) insufficiency: Secondary | ICD-10-CM | POA: Insufficient documentation

## 2017-08-22 DIAGNOSIS — C649 Malignant neoplasm of unspecified kidney, except renal pelvis: Secondary | ICD-10-CM

## 2017-08-22 DIAGNOSIS — T464X5A Adverse effect of angiotensin-converting-enzyme inhibitors, initial encounter: Secondary | ICD-10-CM | POA: Diagnosis not present

## 2017-08-22 LAB — CBC WITH DIFFERENTIAL (CANCER CENTER ONLY)
Basophils Absolute: 0 10*3/uL (ref 0.0–0.1)
Basophils Relative: 0 %
Eosinophils Absolute: 0.1 10*3/uL (ref 0.0–0.5)
Eosinophils Relative: 3 %
HCT: 25.5 % — ABNORMAL LOW (ref 34.8–46.6)
Hemoglobin: 8.1 g/dL — ABNORMAL LOW (ref 11.6–15.9)
Lymphocytes Relative: 15 %
Lymphs Abs: 0.7 10*3/uL — ABNORMAL LOW (ref 0.9–3.3)
MCH: 33.6 pg (ref 25.1–34.0)
MCHC: 31.8 g/dL (ref 31.5–36.0)
MCV: 105.8 fL — ABNORMAL HIGH (ref 79.5–101.0)
Monocytes Absolute: 0.7 10*3/uL (ref 0.1–0.9)
Monocytes Relative: 16 %
Neutro Abs: 2.9 10*3/uL (ref 1.5–6.5)
Neutrophils Relative %: 66 %
Platelet Count: 260 10*3/uL (ref 145–400)
RBC: 2.41 MIL/uL — ABNORMAL LOW (ref 3.70–5.45)
RDW: 15 % — ABNORMAL HIGH (ref 11.2–14.5)
WBC Count: 4.5 10*3/uL (ref 3.9–10.3)

## 2017-08-22 LAB — CMP (CANCER CENTER ONLY)
ALT: 8 U/L (ref 0–55)
AST: 15 U/L (ref 5–34)
Albumin: 3.3 g/dL — ABNORMAL LOW (ref 3.5–5.0)
Alkaline Phosphatase: 48 U/L (ref 40–150)
Anion gap: 4 (ref 3–11)
BUN: 13 mg/dL (ref 7–26)
CO2: 26 mmol/L (ref 22–29)
Calcium: 9.1 mg/dL (ref 8.4–10.4)
Chloride: 99 mmol/L (ref 98–109)
Creatinine: 1.46 mg/dL — ABNORMAL HIGH (ref 0.60–1.10)
GFR, Est AFR Am: 39 mL/min — ABNORMAL LOW (ref >60)
GFR, Estimated: 34 mL/min — ABNORMAL LOW (ref >60)
Glucose, Bld: 159 mg/dL — ABNORMAL HIGH (ref 70–140)
Potassium: 4.4 mmol/L (ref 3.5–5.1)
Sodium: 129 mmol/L — ABNORMAL LOW (ref 136–145)
Total Bilirubin: 0.2 mg/dL (ref 0.2–1.2)
Total Protein: 5.9 g/dL — ABNORMAL LOW (ref 6.4–8.3)

## 2017-08-22 LAB — RETICULOCYTES
RBC.: 2.41 MIL/uL — ABNORMAL LOW (ref 3.70–5.45)
Retic Count, Absolute: 67.5 10*3/uL (ref 33.7–90.7)
Retic Ct Pct: 2.8 % — ABNORMAL HIGH (ref 0.7–2.1)

## 2017-08-22 MED ORDER — DENOSUMAB 120 MG/1.7ML ~~LOC~~ SOLN
120.0000 mg | Freq: Once | SUBCUTANEOUS | Status: AC
  Administered 2017-08-22: 120 mg via SUBCUTANEOUS

## 2017-08-22 MED ORDER — SUNITINIB MALATE 37.5 MG PO CAPS: ORAL_CAPSULE | ORAL | 3 refills | Status: DC

## 2017-08-22 MED ORDER — DENOSUMAB 120 MG/1.7ML ~~LOC~~ SOLN
SUBCUTANEOUS | Status: AC
  Filled 2017-08-22: qty 1.7

## 2017-08-22 NOTE — Telephone Encounter (Signed)
Spoke with [name] in sickle cell to schedule blood transfusion for pt. Audrey [name] infusion room full Saturday, Monday, and Tuesday. Wednesday soonest opening for Sickle Cell. Pt to arrive at 0830 for type and screen to be drawn and to receive one unit PRBCs with tylenol/benadryl pre-medications. Orders placed per MD. Pt given map to Sickle Cell and appt notification.

## 2017-08-22 NOTE — Telephone Encounter (Signed)
Printed avs and calender of upcoming appointment. Per 5/3 los no orders for ct/pet are in.

## 2017-08-27 ENCOUNTER — Ambulatory Visit (HOSPITAL_COMMUNITY)
Admission: RE | Admit: 2017-08-27 | Discharge: 2017-08-27 | Disposition: A | Discharge status: Home / Self Care | Payer: Medicare Other | Source: Ambulatory Visit | Attending: Hematology | Admitting: Hematology

## 2017-08-27 DIAGNOSIS — D649 Anemia, unspecified: Secondary | ICD-10-CM | POA: Insufficient documentation

## 2017-08-27 LAB — PREPARE RBC (CROSSMATCH)

## 2017-08-27 MED ORDER — SODIUM CHLORIDE 0.9 % IV SOLN
250.0000 mL | Freq: Once | INTRAVENOUS | Status: AC
  Administered 2017-08-27: 250 mL via INTRAVENOUS

## 2017-08-27 MED ORDER — DIPHENHYDRAMINE HCL 50 MG/ML IJ SOLN: 25.0000 mg | Freq: Once | INTRAMUSCULAR | Status: DC

## 2017-08-27 MED ORDER — DIPHENHYDRAMINE HCL 25 MG PO CAPS
25.0000 mg | ORAL_CAPSULE | Freq: Once | ORAL | Status: AC
  Administered 2017-08-27: 25 mg via ORAL
  Filled 2017-08-27: qty 1

## 2017-08-27 NOTE — Discharge Instructions (Signed)
Blood Transfusion, Adult, Care After This sheet gives you information about how to care for yourself after your procedure. Your health care provider may also give you more specific instructions. If you have problems or questions, contact your health care provider. What can I expect after the procedure? After your procedure, it is common to have:  Bruising and soreness where the IV tube was inserted.  Headache.  Follow these instructions at home:  Take over-the-counter and prescription medicines only as told by your health care provider.  Return to your normal activities as told by your health care provider.  Follow instructions from your health care provider about how to take care of your IV insertion site. Make sure you: ? Wash your hands with soap and water before you change your bandage (dressing). If soap and water are not available, use hand sanitizer. ? Change your dressing as told by your health care provider.  Check your IV insertion site every day for signs of infection. Check for: ? More redness, swelling, or pain. ? More fluid or blood. ? Warmth. ? Pus or a bad smell. Contact a health care provider if:  You have more redness, swelling, or pain around the IV insertion site.  You have more fluid or blood coming from the IV insertion site.  Your IV insertion site feels warm to the touch.  You have pus or a bad smell coming from the IV insertion site.  Your urine turns pink, red, or brown.  You feel weak after doing your normal activities. Get help right away if:  You have signs of a serious allergic or immune system reaction, including: ? Itchiness. ? Hives. ? Trouble breathing. ? Anxiety. ? Chest or lower back pain. ? Fever, flushing, and chills. ? Rapid pulse. ? Rash. ? Diarrhea. ? Vomiting. ? Dark urine. ? Serious headache. ? Dizziness. ? Stiff neck. ? Yellow coloration of the face or the white parts of the eyes (jaundice). This information is not  intended to replace advice given to you by your health care provider. Make sure you discuss any questions you have with your health care provider. Document Released: 04/29/2014 Document Revised: 12/06/2015 Document Reviewed: 10/23/2015 Elsevier Interactive Patient Education  2018 [employer].  

## 2017-08-28 ENCOUNTER — Other Ambulatory Visit: Payer: Self-pay | Admitting: Pharmacist

## 2017-08-28 LAB — TYPE AND SCREEN
ABO/RH(D): O POS
Antibody Screen: NEGATIVE
Unit division: 0

## 2017-08-28 LAB — BPAM RBC
Blood Product Expiration Date: 201906012359
ISSUE DATE / TIME: 201905080958
Unit Type and Rh: 5100

## 2017-09-03 ENCOUNTER — Telehealth: Payer: Self-pay | Admitting: Pharmacy Technician

## 2017-09-03 NOTE — Telephone Encounter (Signed)
Oral Oncology Patient Advocate Encounter  Worked with patient via phone and email to complete a renewal application for [employer] in an effort to continue to keep the patient's out of pocket expense at Sutent to $0.    Application completed and faxed to [phone].   [employer] patient assistance phone number for follow up is [phone].   This encounter will be updated until final determination.   Fabio Asa. Melynda Keller, [place] Patient [place] [phone] 09/03/2017 2:34 PM

## 2017-09-08 NOTE — Progress Notes (Signed)
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 The patient's last visit with [name] was on 08/22/17. She is accompanied today by her husband. The pt reports that she is doing well overall. She notes that she will be seeing Dr Buddy Duty later today.   The pt reports that she is feeling much better after receiving a blood transfusion. She notes that she continues to tolerate her steroids and Sutent well.  She notes that she was placed on amlodipine by her cardiologist. She is trying to establish care with a nephrologist soon and has had a referral to [facility].   Lab results today (09/10/17) of CBC, CMP, and Reticulocytes is as follows: all values are WNL except for RBC at 3.23, Hgb at 10.7, HCT at 33.7, MCV at 104.3, RDW at 15.7, Sodium at 135, Creatinine at 1.65, Calcium at 7.8, Total Protein on 5.9, Albumin at 3.4. TSH 09/10/17 is trending upwards to 12.73  On review of systems, pt reports improved energy levels, occasional diarrhea, and denies abdominal pains, nausea, vomiting, constipation, abdominal pains, and any other symptoms.    MEDICAL HISTORY:    Hypertension Dyslipidemia Osteoarthritis Ex-smoker Coronary artery disease Thyroid disorder-was apparently on levothyroxine 25 g daily which has subsequently been discontinued . 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 Earley Brooke Colonoscopy 03/2016 Dr.  Earley Brooke  SOCIAL HISTORY: Social History   Socioeconomic History  . Marital status: Married    Spouse name: Not on file  . Number of children: Not on file  . Years of education: Not on file  . Highest education level: Not on file  Occupational History  . Not on file  Social Needs  . Financial resource strain: Not on file  . Food insecurity:    Worry: Not on file    Inability: Not on file  . Transportation needs:    Medical: Not on file    Non-medical: Not on file  Tobacco Use  . Smoking status: Former Smoker    Years: 8.00    Last attempt to quit: 06/18/1964    Years since quitting: 53.2  . Smokeless tobacco: Never Used  Substance and Sexual Activity  . Alcohol use: No  . Drug use: No  . Sexual activity: Not on file  Lifestyle  . Physical activity:    Days per week: Not on file    Minutes per session: Not on file  . Stress: Not on file  Relationships  . Social connections:    Talks on phone: Not on file    Gets together: Not on file    Attends religious service: Not on file    Active member of club or organization: Not on file    Attends meetings of clubs or organizations: Not on file    Relationship status: Not on file  . Intimate partner violence:    Fear of current or ex partner: Not on file    Emotionally abused: Not on  file    Physically abused: Not on file    Forced sexual activity: Not on file  Other Topics Concern  . Not on file  Social History Narrative  . Not on file   Patient lives in [place] Works as a Solicitor part-time Ex-smoker quit long time ago In 1961 . Smoked 2 packs per day for 8 years prior to that .  or a lot of stress since her daughter is also getting treated for stage IV uterine cancer.  FAMILY HISTORY:  Mother deceased Father with asthma and emphysema died at 36 years with the MI. Brother deceased of heart disease   ALLERGIES:  is allergic to adhesive [tape]; nsaids; and statins.  MEDICATIONS:  Current  Outpatient Medications  Medication Sig Dispense Refill  . acetaminophen (TYLENOL) 500 MG tablet Take 1,000 mg by mouth 2 (two) times daily.     Marland Kitchen amLODipine (NORVASC) 5 MG tablet Take 5 mg by mouth daily.  6  . atorvastatin (LIPITOR) 40 MG tablet Take 40 mg by mouth daily at 6 PM.     . carvedilol (COREG) 6.25 MG tablet Take 6.25 mg by mouth 2 (two) times daily.  6  . clobetasol ointment (TEMOVATE) 5.28 % Apply 1 application topically 2 (two) times daily.    . furosemide (LASIX) 20 MG tablet Take 20 mg by mouth daily.    . hydrocortisone (CORTEF) 10 MG tablet Take 10 mg by mouth daily.    Marland Kitchen levothyroxine (SYNTHROID, LEVOTHROID) 25 MCG tablet Take 25 mcg by mouth daily before breakfast.    . magic mouthwash w/lidocaine SOLN Take 5 mLs by mouth 4 (four) times daily as needed for mouth pain. Swish and spit. (1 Part viscous lidocaine 2% 1 Part Maalox 1 Part diphenhydramine 12.5 mg per 36mL elixir) 120 mL 1  . ondansetron (ZOFRAN) 4 MG tablet Take 1 tablet (4 mg total) by mouth every 8 (eight) hours as needed for nausea. 30 tablet 2  . pantoprazole (PROTONIX) 20 MG tablet Take 20 mg by mouth daily.    . prochlorperazine (COMPAZINE) 10 MG tablet TAKE 1 TABLET BY MOUTH EVERY 6 HOURS AS NEEDED FOR NAUSEA OR VOMITING 30 tablet 2  . SUNItinib (SUTENT) 37.5 MG capsule 37.5 mg po daily 2 weeks on 1 week off 28 capsule 3   No current facility-administered medications for this visit.    Facility-Administered Medications Ordered in Other Visits  Medication Dose Route Frequency Provider Last Rate Last Dose  . denosumab (XGEVA) injection 120 mg  120 mg Subcutaneous Once Brunetta Genera, MD        REVIEW OF SYSTEMS:    A 10+ POINT REVIEW OF SYSTEMS WAS OBTAINED including neurology, dermatology, psychiatry, cardiac, respiratory, lymph, extremities, GI, GU, Musculoskeletal, constitutional, breasts, reproductive, HEENT.  All pertinent positives are noted in the HPI.  All others are negative.    PHYSICAL  EXAMINATION:  ECOG PERFORMANCE STATUS: 1 - Symptomatic but completely ambulatory  . Vitals:   09/10/17 1001  BP: (!) 154/74  Pulse: 65  Resp: 18  Temp: 97.9 F (36.6 C)  SpO2: 100%   Filed Weights   09/10/17 1001  Weight: 133 lb 6.4 oz (60.5 kg)   .Body mass index is 26.94 kg/m.   GENERAL:alert, in no acute distress and comfortable SKIN: no acute rashes, no significant lesions EYES: conjunctiva are pink and non-injected, sclera anicteric OROPHARYNX: MMM, no exudates, no oropharyngeal erythema or ulceration NECK: supple, no JVD LYMPH:  no palpable lymphadenopathy in  the cervical, axillary or inguinal regions LUNGS: clear to auscultation b/l with normal respiratory effort HEART: regular rate & rhythm ABDOMEN:  normoactive bowel sounds , non tender, not distended. Extremity: no pedal edema PSYCH: alert & oriented x 3 with fluent speech NEURO: no focal motor/sensory deficits   LABORATORY DATA:  I have reviewed the data as listed  . CBC Latest Ref Rng & Units 09/10/2017 08/22/2017 08/01/2017  WBC 3.9 - 10.3 K/uL 5.1 4.5 6.9  Hemoglobin 11.6 - 15.9 g/dL 10.7(L) 8.1(L) 9.7(L)  Hematocrit 34.8 - 46.6 % 33.7(L) 25.5(L) 30.2(L)  Platelets 145 - 400 K/uL 216 260 204    CBC    Component Value Date/Time   WBC 5.1 09/10/2017 0837   RBC 3.23 (L) 09/10/2017 0837   RBC 3.23 (L) 09/10/2017 0837   HGB 10.7 (L) 09/10/2017 0837   HGB 8.1 (L) 08/22/2017 1043   HGB 11.2 (L) 04/04/2017 0830   HCT 33.7 (L) 09/10/2017 0837   HCT 35.2 04/04/2017 0830   PLT 216 09/10/2017 0837   PLT 260 08/22/2017 1043   PLT 250 04/04/2017 0830   MCV 104.3 (H) 09/10/2017 0837   MCV 107.6 (H) 04/04/2017 0830   MCH 33.1 09/10/2017 0837   MCHC 31.8 09/10/2017 0837   RDW 15.7 (H) 09/10/2017 0837   RDW 14.0 04/04/2017 0830   LYMPHSABS 1.1 09/10/2017 0837   LYMPHSABS 1.6 04/04/2017 0830   MONOABS 0.3 09/10/2017 0837   MONOABS 0.4 04/04/2017 0830   EOSABS 0.2 09/10/2017 0837   EOSABS 0.1 04/04/2017 0830    BASOSABS 0.0 09/10/2017 0837   BASOSABS 0.0 04/04/2017 0830     . CMP Latest Ref Rng & Units 09/10/2017 08/22/2017 08/01/2017  Glucose 70 - 140 mg/dL 126 159(H) 69(L)  BUN 7 - 26 mg/dL 16 13 41(H)  Creatinine 0.60 - 1.10 mg/dL 1.65(H) 1.46(H) 2.52(H)  Sodium 136 - 145 mmol/L 135(L) 129(L) 134(L)  Potassium 3.5 - 5.1 mmol/L 4.3 4.4 5.9(H)  Chloride 98 - 109 mmol/L 105 99 107  CO2 22 - 29 mmol/L 23 26 17(L)  Calcium 8.4 - 10.4 mg/dL 7.8(L) 9.1 9.4  Total Protein 6.4 - 8.3 g/dL 5.9(L) 5.9(L) 7.2  Total Bilirubin 0.2 - 1.2 mg/dL 0.5 0.2 0.3  Alkaline Phos 40 - 150 U/L 56 48 47  AST 5 - 34 U/L 16 15 18   ALT 0 - 55 U/L 14 8 9    B12 level 299 (OSH)-->455  . Lab Results  Component Value Date   LDH 188 08/01/2017         RADIOGRAPHIC STUDIES: I have personally reviewed the radiological images as listed and agreed with the findings in the report. Nm Pet Image Restag (ps) Skull Base To Thigh  Result Date: 01/03/2017 IMPRESSION: 1. There is a new lesion in the hepatic dome which is FDG avid and low in attenuation on CT imaging consistent with metastatic disease. Another region of uptake in the left hepatic lobe posteriorly demonstrates no CT correlate. A second subtle metastasis is not excluded on today's study. An MRI could better assess for other hepatic metastases if clinically warranted. 2. New metastatic lesion in the left side of T8. 3. New pulmonary nodule in the right upper lobe. This is too small to characterize but suspicious. Recommend attention on follow-up. No FDG avid nodules or other enlarging nodules. 4. The uptake at the previous left renal artery has almost resolved in the interval and is favored to be post therapeutic. Recommend attention on follow-up. 5. The metastasis  in the posterior right hilum seen previously has resolved. Electronically Signed   By: Dorise Bullion III M.D   On: 01/03/2017 11:16   .No results found.  ASSESSMENT & PLAN:    77 year old Caucasian  female with  #1 Metastatic Left renal clear cell Renal cell carcinoma She has bilateral adrenal and pulmonary metastatic disease and T7/8 metastatic bone disease. PET/CT 06/19/2017 -- consistent with partial metabolic response to treatment.  Rt adrenal gland bx - showed clear cell RCC  S/p CYtoreductive left radical nephrectomy and left adrenal gland resection on 08/01/2016 by Dr Alinda Money.  #2 b/l adrenal metastases from [place] s/p left adrenalectomy with adrenal insuff - follows with Dr Buddy Duty.  #3 Small pulmonary lesions -- PET scan suggestive of metastases from [place] - improved on recent PET/CT 06/19/17  MRI brain shows no evidence of metastatic disease  #4 T7/8 Bone metastases - on Xgeva  #5 ?liver mets- rpt PET/CT from 06/19/2017 shows no overt evidence of metastatic disease in the liver.  #6 Grade 1 Nausea - improved and intermittent. Hasnt used her anti-emetic as instructed so some nausea and decreased po intake.  #7 Grade 1 Diarrhea - from sutent controlled with prn Imodium.  #8 Hyponatremia nearly resolved sodium level 129 - likely related to some element of adrenal insufficiency, diarrhea, limited by mouth intake. Primarily solute free fluid intake.   #9 Acute on chronic renal insufficiency Creatine up to 2.52 - due to poor po intake and dehydration and ?ACEI - now back to baseline.  #10 Hyperkalemia due to ACEI + RF- resolved  Plan --continue  -Discussed pt labwork today, 09/10/17; anemia improved with Hgb increased to 10.7, sodium normalizing to 135, calcium decreased to 7.8. Blood counts and chemistries are otherwise stable.  -Will have PET/CT prior to next visit in 6 weeks -The pt has no prohibitive toxicities from continuing Sutent or Xgeva at this time.  ---with Delton See injections q 6 weeks   #9 Hemorrhoids -chronic with some bleeing --Recommended Sitz bath and OTC Anusol or Nupercaine for her hemorrhoid relief. 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 Buddy Duty -TSH is increasing to 12.73 -- will recommend following up with Dr Buddy Duty to consider increasing in levothyroxine dose  #12 HTN - ?control. Patient tends to be anxious and has higher blood pressures in the clinic. She can have increased blood pressure from Sutent as well. Plan - again reiterated need for close f/u with her PCP /cardiology regarding the many elements necessary for her care that are not directly related to her oncology care. -ACEI held due to AKI and hyperkalemia - following with cardiology to mx this. Has been started on Amlodipine instead.  #13 Grade 1 mucositis- resolved -Advised the patient to continue use of Magic Mouthwash to aid with nutrition   PET/CT in 5 weeks Please reschedule next Xgeva shot in 6 weeks with clinic visit RTC with Dr Irene Limbo in 6 weeks with labs and PET/CT   All of the patients questions were answered with apparent satisfaction. The patient knows to call the clinic with  any problems, questions or concerns.  . The total time spent in the appointment was 25 minutes and more than 50% was on counseling and direct patient cares.     Sullivan Lone MD [place] AAHIVMS [facility] Hematology/Oncology Physician [place]  (Office):       [phone] (Work cell):  [phone] (Fax):           [phone]  This document serves as a record of services personally performed by Sullivan Lone, MD. It was created on his behalf by Baldwin Jamaica, a trained medical scribe. The creation of this record is based on  the scribe's personal observations and the provider's statements to them.   .I have reviewed the above documentation for accuracy and completeness, and I agree with the above. Brunetta Genera MD MS

## 2017-09-10 ENCOUNTER — Encounter: Payer: Self-pay | Admitting: Hematology

## 2017-09-10 ENCOUNTER — Telehealth: Payer: Self-pay | Admitting: Hematology

## 2017-09-10 ENCOUNTER — Inpatient Hospital Stay: Payer: Medicare Other

## 2017-09-10 ENCOUNTER — Inpatient Hospital Stay (HOSPITAL_BASED_OUTPATIENT_CLINIC_OR_DEPARTMENT_OTHER): Payer: Medicare Other | Admitting: Hematology

## 2017-09-10 VITALS — BP 154/74 | HR 65 | Temp 97.9°F | Resp 18 | Ht 59.0 in | Wt 133.4 lb

## 2017-09-10 DIAGNOSIS — E079 Disorder of thyroid, unspecified: Secondary | ICD-10-CM

## 2017-09-10 DIAGNOSIS — C7972 Secondary malignant neoplasm of left adrenal gland: Secondary | ICD-10-CM | POA: Diagnosis not present

## 2017-09-10 DIAGNOSIS — E039 Hypothyroidism, unspecified: Secondary | ICD-10-CM

## 2017-09-10 DIAGNOSIS — I340 Nonrheumatic mitral (valve) insufficiency: Secondary | ICD-10-CM

## 2017-09-10 DIAGNOSIS — K649 Unspecified hemorrhoids: Secondary | ICD-10-CM

## 2017-09-10 DIAGNOSIS — I2510 Atherosclerotic heart disease of native coronary artery without angina pectoris: Secondary | ICD-10-CM

## 2017-09-10 DIAGNOSIS — E538 Deficiency of other specified B group vitamins: Secondary | ICD-10-CM

## 2017-09-10 DIAGNOSIS — I129 Hypertensive chronic kidney disease with stage 1 through stage 4 chronic kidney disease, or unspecified chronic kidney disease: Secondary | ICD-10-CM

## 2017-09-10 DIAGNOSIS — E2740 Unspecified adrenocortical insufficiency: Secondary | ICD-10-CM

## 2017-09-10 DIAGNOSIS — R197 Diarrhea, unspecified: Secondary | ICD-10-CM | POA: Diagnosis not present

## 2017-09-10 DIAGNOSIS — C7951 Secondary malignant neoplasm of bone: Secondary | ICD-10-CM | POA: Diagnosis not present

## 2017-09-10 DIAGNOSIS — E875 Hyperkalemia: Secondary | ICD-10-CM

## 2017-09-10 DIAGNOSIS — Z87891 Personal history of nicotine dependence: Secondary | ICD-10-CM

## 2017-09-10 DIAGNOSIS — Z905 Acquired absence of kidney: Secondary | ICD-10-CM

## 2017-09-10 DIAGNOSIS — E119 Type 2 diabetes mellitus without complications: Secondary | ICD-10-CM

## 2017-09-10 DIAGNOSIS — R109 Unspecified abdominal pain: Secondary | ICD-10-CM

## 2017-09-10 DIAGNOSIS — N179 Acute kidney failure, unspecified: Secondary | ICD-10-CM

## 2017-09-10 DIAGNOSIS — C7800 Secondary malignant neoplasm of unspecified lung: Secondary | ICD-10-CM

## 2017-09-10 DIAGNOSIS — C642 Malignant neoplasm of left kidney, except renal pelvis: Secondary | ICD-10-CM

## 2017-09-10 DIAGNOSIS — I10 Essential (primary) hypertension: Secondary | ICD-10-CM

## 2017-09-10 DIAGNOSIS — E860 Dehydration: Secondary | ICD-10-CM

## 2017-09-10 DIAGNOSIS — K449 Diaphragmatic hernia without obstruction or gangrene: Secondary | ICD-10-CM

## 2017-09-10 DIAGNOSIS — E785 Hyperlipidemia, unspecified: Secondary | ICD-10-CM

## 2017-09-10 DIAGNOSIS — E871 Hypo-osmolality and hyponatremia: Secondary | ICD-10-CM

## 2017-09-10 DIAGNOSIS — Z79899 Other long term (current) drug therapy: Secondary | ICD-10-CM | POA: Diagnosis not present

## 2017-09-10 DIAGNOSIS — C649 Malignant neoplasm of unspecified kidney, except renal pelvis: Secondary | ICD-10-CM

## 2017-09-10 DIAGNOSIS — M1990 Unspecified osteoarthritis, unspecified site: Secondary | ICD-10-CM

## 2017-09-10 DIAGNOSIS — I252 Old myocardial infarction: Secondary | ICD-10-CM

## 2017-09-10 LAB — CMP (CANCER CENTER ONLY)
ALT: 14 U/L (ref 0–55)
AST: 16 U/L (ref 5–34)
Albumin: 3.4 g/dL — ABNORMAL LOW (ref 3.5–5.0)
Alkaline Phosphatase: 56 U/L (ref 40–150)
Anion gap: 7 (ref 3–11)
BUN: 16 mg/dL (ref 7–26)
CO2: 23 mmol/L (ref 22–29)
Calcium: 7.8 mg/dL — ABNORMAL LOW (ref 8.4–10.4)
Chloride: 105 mmol/L (ref 98–109)
Creatinine: 1.65 mg/dL — ABNORMAL HIGH (ref 0.60–1.10)
GFR, Est AFR Am: 34 mL/min — ABNORMAL LOW (ref >60)
GFR, Estimated: 29 mL/min — ABNORMAL LOW (ref >60)
Glucose, Bld: 126 mg/dL (ref 70–140)
Potassium: 4.3 mmol/L (ref 3.5–5.1)
Sodium: 135 mmol/L — ABNORMAL LOW (ref 136–145)
Total Bilirubin: 0.5 mg/dL (ref 0.2–1.2)
Total Protein: 5.9 g/dL — ABNORMAL LOW (ref 6.4–8.3)

## 2017-09-10 LAB — CBC WITH DIFFERENTIAL/PLATELET
Basophils Absolute: 0 10*3/uL (ref 0.0–0.1)
Basophils Relative: 0 %
Eosinophils Absolute: 0.2 10*3/uL (ref 0.0–0.5)
Eosinophils Relative: 3 %
HCT: 33.7 % — ABNORMAL LOW (ref 34.8–46.6)
Hemoglobin: 10.7 g/dL — ABNORMAL LOW (ref 11.6–15.9)
Lymphocytes Relative: 22 %
Lymphs Abs: 1.1 10*3/uL (ref 0.9–3.3)
MCH: 33.1 pg (ref 25.1–34.0)
MCHC: 31.8 g/dL (ref 31.5–36.0)
MCV: 104.3 fL — ABNORMAL HIGH (ref 79.5–101.0)
Monocytes Absolute: 0.3 10*3/uL (ref 0.1–0.9)
Monocytes Relative: 6 %
Neutro Abs: 3.5 10*3/uL (ref 1.5–6.5)
Neutrophils Relative %: 69 %
Platelets: 216 10*3/uL (ref 145–400)
RBC: 3.23 MIL/uL — ABNORMAL LOW (ref 3.70–5.45)
RDW: 15.7 % — ABNORMAL HIGH (ref 11.2–14.5)
WBC: 5.1 10*3/uL (ref 3.9–10.3)

## 2017-09-10 LAB — RETICULOCYTES
RBC.: 3.23 MIL/uL — ABNORMAL LOW (ref 3.70–5.45)
Retic Count, Absolute: 45.2 10*3/uL (ref 33.7–90.7)
Retic Ct Pct: 1.4 % (ref 0.7–2.1)

## 2017-09-10 MED ORDER — SUNITINIB MALATE 37.5 MG PO CAPS: ORAL_CAPSULE | ORAL | 3 refills | Status: DC

## 2017-09-10 NOTE — Patient Instructions (Signed)
Thank you for choosing [facility] to provide your oncology and hematology care.  To afford each patient quality time with our providers, please arrive 30 minutes before your scheduled appointment time.  If you arrive late for your appointment, you may be asked to reschedule.  We strive to give you quality time with our providers, and arriving late affects you and other patients whose appointments are after yours.   If you are a no show for multiple scheduled visits, you may be dismissed from the clinic at the providers discretion.    Again, thank you for choosing [facility], our hope is that these requests will decrease the amount of time that you wait before being seen by our physicians.  ______________________________________________________________________  Should you have questions after your visit to the [facility], please contact our office at [phone] between the hours of 8:30 and 4:30 p.m.    Voicemails left after 4:30p.m will not be returned until the following business day.    For prescription refill requests, please have your pharmacy contact us directly.  Please also try to allow 48 hours for prescription requests.    Please contact the scheduling department for questions regarding scheduling.  For scheduling of procedures such as PET scans, CT scans, MRI, Ultrasound, etc please contact central scheduling at [phone].    Resources For Cancer Patients and Caregivers:   Oncolink.org:  A wonderful resource for patients and healthcare providers for information regarding your disease, ways to tract your treatment, what to expect, etc.     American Cancer Society:  800-227-2345  Can help patients locate various types of support and financial assistance  Cancer Care: 1-800-813-HOPE (4673) Provides financial assistance, online support groups, medication/co-pay assistance.    [place] DSS:  336-641-3447 Where to apply for food  stamps, Medicaid, and utility assistance  Medicare Rights Center: 800-333-4114 Helps people with Medicare understand their rights and benefits, navigate the Medicare system, and secure the quality healthcare they deserve  SCAT: 336-333-6589 [place] [employer]'s shared-ride transportation service for eligible riders who have a disability that prevents them from riding the fixed route bus.    For additional information on assistance programs please contact our social worker:   Grier Hock/Abigail Elmore:  [phone]            

## 2017-09-10 NOTE — Telephone Encounter (Signed)
Appointments scheduled AVS/Calendar printed per 5/22 los °

## 2017-09-11 LAB — THYROID PANEL WITH TSH
Free Thyroxine Index: 1.5 (ref 1.2–4.9)
T3 Uptake Ratio: 26 % (ref 24–39)
T4, Total: 5.6 ug/dL (ref 4.5–12.0)
TSH: 12.73 u[IU]/mL — ABNORMAL HIGH (ref 0.450–4.500)

## 2017-09-12 NOTE — Telephone Encounter (Signed)
Oral Oncology Patient Advocate Encounter  Received notification from [place] Patient Assistance program that patient has been successfully re-enrolled into their program to continue to receive Sutent from the manufacturer at $0 out of pocket until 04/21/2018.   I called and spoke with patient.  She understands that we will have to re-apply for continued enrollment in 2020  Patient knows to call the office with any additional  questions or concerns.  Oral Oncology Clinic will continue to follow.  Gilmore Laroche, CPhT, [facility] Patient Advocate [phone] 09/12/2017 10:01 AM

## 2017-09-17 ENCOUNTER — Telehealth: Payer: Self-pay

## 2017-09-17 ENCOUNTER — Other Ambulatory Visit: Payer: Self-pay | Admitting: Hematology

## 2017-09-17 DIAGNOSIS — C649 Malignant neoplasm of unspecified kidney, except renal pelvis: Secondary | ICD-10-CM

## 2017-09-17 NOTE — Telephone Encounter (Signed)
Pt called because of attempts to schedule PET, but Central Scheduling has told pt that there was not an order placed. Note given to [name] to order PET Scan. Pt encouraged to call Central Scheduling tomorrow. Pt also wanted to check and see if sutent was refilled. Noted order for 28 pills with 3 refills ordered on 09/10/17. Pt verbalized understanding and thanks for the communication.

## 2017-09-19 ENCOUNTER — Telehealth: Payer: Self-pay

## 2017-09-19 NOTE — Telephone Encounter (Signed)
Called pt to let her know that TSH is increasing. Last result 12.730. Pt encouraged to review with Dr. Buddy Duty to determine need for levothyroxine dose change. Pt saw Dr. Buddy Duty same day as [name], but plan to call and f/u with MD just in case change needed.

## 2017-10-03 ENCOUNTER — Ambulatory Visit: Payer: Medicare Other

## 2017-10-03 ENCOUNTER — Other Ambulatory Visit: Payer: Medicare Other

## 2017-10-03 ENCOUNTER — Ambulatory Visit: Payer: Medicare Other | Admitting: Hematology

## 2017-10-16 ENCOUNTER — Ambulatory Visit (HOSPITAL_COMMUNITY)
Admission: RE | Admit: 2017-10-16 | Discharge: 2017-10-16 | Disposition: A | Discharge status: Home / Self Care | Payer: Medicare Other | Source: Ambulatory Visit | Attending: Hematology | Admitting: Hematology

## 2017-10-16 DIAGNOSIS — N8110 Cystocele, unspecified: Secondary | ICD-10-CM | POA: Insufficient documentation

## 2017-10-16 DIAGNOSIS — J322 Chronic ethmoidal sinusitis: Secondary | ICD-10-CM | POA: Diagnosis not present

## 2017-10-16 DIAGNOSIS — C649 Malignant neoplasm of unspecified kidney, except renal pelvis: Secondary | ICD-10-CM | POA: Diagnosis present

## 2017-10-16 DIAGNOSIS — N8189 Other female genital prolapse: Secondary | ICD-10-CM | POA: Insufficient documentation

## 2017-10-16 DIAGNOSIS — K8020 Calculus of gallbladder without cholecystitis without obstruction: Secondary | ICD-10-CM | POA: Insufficient documentation

## 2017-10-16 DIAGNOSIS — M461 Sacroiliitis, not elsewhere classified: Secondary | ICD-10-CM | POA: Diagnosis not present

## 2017-10-16 DIAGNOSIS — Z905 Acquired absence of kidney: Secondary | ICD-10-CM | POA: Diagnosis not present

## 2017-10-16 DIAGNOSIS — R911 Solitary pulmonary nodule: Secondary | ICD-10-CM | POA: Diagnosis not present

## 2017-10-16 DIAGNOSIS — I700 Atherosclerosis of aorta: Secondary | ICD-10-CM | POA: Diagnosis not present

## 2017-10-16 LAB — GLUCOSE, CAPILLARY: Glucose-Capillary: 111 mg/dL — ABNORMAL HIGH (ref 70–99)

## 2017-10-16 MED ORDER — FLUDEOXYGLUCOSE F - 18 (FDG) INJECTION
6.7200 | Freq: Once | INTRAVENOUS | Status: AC
  Administered 2017-10-16: 6.72 via INTRAVENOUS

## 2017-10-24 ENCOUNTER — Other Ambulatory Visit: Payer: Medicare Other

## 2017-10-24 ENCOUNTER — Encounter: Payer: Self-pay | Admitting: *Deleted

## 2017-10-24 ENCOUNTER — Inpatient Hospital Stay: Payer: Medicare Other | Attending: Hematology

## 2017-10-24 ENCOUNTER — Inpatient Hospital Stay: Payer: Medicare Other

## 2017-10-24 ENCOUNTER — Telehealth: Payer: Self-pay | Admitting: Hematology

## 2017-10-24 ENCOUNTER — Inpatient Hospital Stay (HOSPITAL_BASED_OUTPATIENT_CLINIC_OR_DEPARTMENT_OTHER): Payer: Medicare Other | Admitting: Hematology

## 2017-10-24 ENCOUNTER — Telehealth: Payer: Self-pay | Admitting: Medical Oncology

## 2017-10-24 ENCOUNTER — Other Ambulatory Visit: Payer: Self-pay | Admitting: *Deleted

## 2017-10-24 ENCOUNTER — Ambulatory Visit: Payer: Medicare Other

## 2017-10-24 ENCOUNTER — Ambulatory Visit: Payer: Medicare Other | Admitting: Hematology

## 2017-10-24 VITALS — BP 139/66 | HR 70 | Temp 98.0°F | Resp 18 | Ht 59.0 in | Wt 134.9 lb

## 2017-10-24 DIAGNOSIS — Z905 Acquired absence of kidney: Secondary | ICD-10-CM | POA: Insufficient documentation

## 2017-10-24 DIAGNOSIS — N184 Chronic kidney disease, stage 4 (severe): Secondary | ICD-10-CM

## 2017-10-24 DIAGNOSIS — C7972 Secondary malignant neoplasm of left adrenal gland: Secondary | ICD-10-CM

## 2017-10-24 DIAGNOSIS — C7951 Secondary malignant neoplasm of bone: Secondary | ICD-10-CM

## 2017-10-24 DIAGNOSIS — Z79899 Other long term (current) drug therapy: Secondary | ICD-10-CM

## 2017-10-24 DIAGNOSIS — I129 Hypertensive chronic kidney disease with stage 1 through stage 4 chronic kidney disease, or unspecified chronic kidney disease: Secondary | ICD-10-CM

## 2017-10-24 DIAGNOSIS — C7971 Secondary malignant neoplasm of right adrenal gland: Secondary | ICD-10-CM | POA: Diagnosis not present

## 2017-10-24 DIAGNOSIS — N814 Uterovaginal prolapse, unspecified: Secondary | ICD-10-CM

## 2017-10-24 DIAGNOSIS — C642 Malignant neoplasm of left kidney, except renal pelvis: Secondary | ICD-10-CM

## 2017-10-24 DIAGNOSIS — K8080 Other cholelithiasis without obstruction: Secondary | ICD-10-CM | POA: Insufficient documentation

## 2017-10-24 DIAGNOSIS — C7800 Secondary malignant neoplasm of unspecified lung: Secondary | ICD-10-CM | POA: Insufficient documentation

## 2017-10-24 DIAGNOSIS — E119 Type 2 diabetes mellitus without complications: Secondary | ICD-10-CM

## 2017-10-24 DIAGNOSIS — E039 Hypothyroidism, unspecified: Secondary | ICD-10-CM

## 2017-10-24 DIAGNOSIS — E785 Hyperlipidemia, unspecified: Secondary | ICD-10-CM | POA: Insufficient documentation

## 2017-10-24 LAB — COMPREHENSIVE METABOLIC PANEL
ALT: 10 U/L (ref 0–44)
AST: 14 U/L — ABNORMAL LOW (ref 15–41)
Albumin: 3.6 g/dL (ref 3.5–5.0)
Alkaline Phosphatase: 46 U/L (ref 38–126)
Anion gap: 11 (ref 5–15)
BUN: 19 mg/dL (ref 8–23)
CO2: 25 mmol/L (ref 22–32)
Calcium: 7.2 mg/dL — ABNORMAL LOW (ref 8.9–10.3)
Chloride: 104 mmol/L (ref 98–111)
Creatinine, Ser: 1.79 mg/dL — ABNORMAL HIGH (ref 0.44–1.00)
GFR calc Af Amer: 31 mL/min — ABNORMAL LOW (ref >60)
GFR calc non Af Amer: 27 mL/min — ABNORMAL LOW (ref >60)
Glucose, Bld: 138 mg/dL — ABNORMAL HIGH (ref 70–99)
Potassium: 4 mmol/L (ref 3.5–5.1)
Sodium: 140 mmol/L (ref 135–145)
Total Bilirubin: 0.8 mg/dL (ref 0.3–1.2)
Total Protein: 6.4 g/dL — ABNORMAL LOW (ref 6.5–8.1)

## 2017-10-24 LAB — CBC WITH DIFFERENTIAL (CANCER CENTER ONLY)
Basophils Absolute: 0.1 10*3/uL (ref 0.0–0.1)
Basophils Relative: 1 %
Eosinophils Absolute: 0.1 10*3/uL (ref 0.0–0.5)
Eosinophils Relative: 3 %
HCT: 30.9 % — ABNORMAL LOW (ref 34.8–46.6)
Hemoglobin: 10.4 g/dL — ABNORMAL LOW (ref 11.6–15.9)
Lymphocytes Relative: 20 %
Lymphs Abs: 1 10*3/uL (ref 0.9–3.3)
MCH: 34.4 pg — ABNORMAL HIGH (ref 25.1–34.0)
MCHC: 33.6 g/dL (ref 31.5–36.0)
MCV: 102.6 fL — ABNORMAL HIGH (ref 79.5–101.0)
Monocytes Absolute: 0.4 10*3/uL (ref 0.1–0.9)
Monocytes Relative: 8 %
Neutro Abs: 3.5 10*3/uL (ref 1.5–6.5)
Neutrophils Relative %: 68 %
Platelet Count: 264 10*3/uL (ref 145–400)
RBC: 3.01 MIL/uL — ABNORMAL LOW (ref 3.70–5.45)
RDW: 16.9 % — ABNORMAL HIGH (ref 11.2–14.5)
WBC Count: 5.1 10*3/uL (ref 3.9–10.3)

## 2017-10-24 NOTE — Progress Notes (Addendum)
[place]  [phone] [phone]  Clinic Follow up Note   Patient Care Team: Dhivianathan, Candida Peeling, MD as PCP - General (Family Medicine)   Date of Service:  10/24/2017   CHIEF COMPLAIN: follow up [facility] and discuss PET scan results  INTERVAL HISTORY: Audrey [name] is here to discuss her recent PET scan. 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. I am seeing her in his absence today. Pt came in today without knowing that her appointments has been changed to next Monday, and she requests to be seen by a provider to discuss her PET scan findings. She presents to the clinic today accompanied by her husband and son.  She is tolerating sutent well, and her BP has been better controlled lately.   She notes uterine prolapse and gallstones and hopes to get surgeries for these. She notes fluid in her face and lower legs. We reviewed her medication list. She sees Endocrinologist routinely and last seen in June and increased levothyroxine. She feels well overall. She is going out of town tomorrow for vacation.    REVIEW OF SYSTEMS:   Constitutional: Denies fevers, chills or abnormal weight loss Eyes: Denies blurriness of vision Ears, nose, mouth, throat, and face: Denies mucositis or sore throat Respiratory: Denies cough, dyspnea or wheezes Cardiovascular: Denies palpitation, chest discomfort or lower extremity swelling Gastrointestinal:  Denies nausea, heartburn or change in bowel habits Skin: Denies abnormal skin rashes, (+) facial and leg edema  Lymphatics: Denies new lymphadenopathy or easy bruising Neurological:Denies numbness, tingling or new weaknesses Behavioral/Psych: Mood is stable, no new changes  All other systems were reviewed with the patient and are negative.  MEDICAL HISTORY:  Past Medical History:  Diagnosis Date  . Diabetes mellitus ([place])   . HTN (hypertension)   . Hyperlipidemia     SURGICAL  HISTORY: Past Surgical History:  Procedure Laterality Date  . LAPAROSCOPIC NEPHRECTOMY Left 08/01/2016   Procedure: LAPAROSCOPIC  RADICAL NEPHRECTOMY/ REPAIR OF UMBILICAL HERNIA;  Surgeon: Raynelle Bring, MD;  Location: [facility];  Service: Urology;  Laterality: Left;    I have reviewed the social history and family history with the patient and they are unchanged from previous note.  ALLERGIES:  is allergic to adhesive [tape]; nsaids; and statins.  MEDICATIONS:  Current Outpatient Medications  Medication Sig Dispense Refill  . acetaminophen (TYLENOL) 500 MG tablet Take 1,000 mg by mouth 2 (two) times daily.     Marland Kitchen amLODipine (NORVASC) 5 MG tablet Take 5 mg by mouth daily.  6  . atorvastatin (LIPITOR) 40 MG tablet Take 40 mg by mouth daily at 6 PM.     . carvedilol (COREG) 6.25 MG tablet Take 6.25 mg by mouth 2 (two) times daily.  6  . clobetasol ointment (TEMOVATE) 5.44 % Apply 1 application topically 2 (two) times daily.    . furosemide (LASIX) 20 MG tablet Take 20 mg by mouth daily.    . hydrocortisone (CORTEF) 10 MG tablet Take 10 mg by mouth daily.    Marland Kitchen levothyroxine (SYNTHROID, LEVOTHROID) 25 MCG tablet Take 25 mcg by mouth daily before breakfast.    . magic mouthwash w/lidocaine SOLN Take 5 mLs by mouth 4 (four) times daily as needed for mouth pain. Swish and spit. (1 Part viscous lidocaine 2% 1 Part Maalox 1 Part diphenhydramine 12.5 mg per 78mL elixir) 120 mL 1  . ondansetron (ZOFRAN) 4 MG tablet Take 1 tablet (4 mg total) by mouth  every 8 (eight) hours as needed for nausea. 30 tablet 2  . pantoprazole (PROTONIX) 20 MG tablet Take 20 mg by mouth daily.    . prochlorperazine (COMPAZINE) 10 MG tablet TAKE 1 TABLET BY MOUTH EVERY 6 HOURS AS NEEDED FOR NAUSEA OR VOMITING 30 tablet 2  . SUNItinib (SUTENT) 37.5 MG capsule 37.5 mg po daily 2 weeks on 1 week off 28 capsule 3   No current facility-administered medications for this visit.    Facility-Administered Medications Ordered in Other  Visits  Medication Dose Route Frequency Provider Last Rate Last Dose  . denosumab (XGEVA) injection 120 mg  120 mg Subcutaneous Once Brunetta Genera, MD        PHYSICAL EXAMINATION: ECOG PERFORMANCE STATUS: 1 - Symptomatic but completely ambulatory  Vitals:   10/24/17 1103  BP: 139/66  Pulse: 70  Resp: 18  Temp: 98 F (36.7 C)  SpO2: 100%   Filed Weights   10/24/17 1103  Weight: 134 lb 14.4 oz (61.2 kg)    GENERAL:alert, no distress and comfortable SKIN: skin color, texture, turgor are normal, no rashes or significant lesions EYES: normal, Conjunctiva are pink and non-injected, sclera clear. (+) Soft tissue swelling around her eyes, more on the left. OROPHARYNX:no exudate, no erythema and lips, buccal mucosa, and tongue normal  NECK: supple, thyroid normal size, non-tender, without nodularity LYMPH:  no palpable lymphadenopathy in the cervical, axillary or inguinal LUNGS: clear to auscultation and percussion with normal breathing effort HEART: regular rate & rhythm and no murmurs and no lower extremity edema ABDOMEN:abdomen soft, non-tender and normal bowel sounds Musculoskeletal:no cyanosis of digits and no clubbing, trace edema on lower extremities. NEURO: alert & oriented x 3 with fluent speech, no focal motor/sensory deficits  LABORATORY DATA:  I have reviewed the data as listed CBC Latest Ref Rng & Units 10/24/2017 09/10/2017 08/22/2017  WBC 3.9 - 10.3 K/uL 5.1 5.1 4.5  Hemoglobin 11.6 - 15.9 g/dL 10.4(L) 10.7(L) 8.1(L)  Hematocrit 34.8 - 46.6 % 30.9(L) 33.7(L) 25.5(L)  Platelets 145 - 400 K/uL 264 216 260     CMP Latest Ref Rng & Units 10/24/2017 09/10/2017 08/22/2017  Glucose 70 - 99 mg/dL 138(H) 126 159(H)  BUN 8 - 23 mg/dL 19 16 13   Creatinine 0.44 - 1.00 mg/dL 1.79(H) 1.65(H) 1.46(H)  Sodium 135 - 145 mmol/L 140 135(L) 129(L)  Potassium 3.5 - 5.1 mmol/L 4.0 4.3 4.4  Chloride 98 - 111 mmol/L 104 105 99  CO2 22 - 32 mmol/L 25 23 26   Calcium 8.9 - 10.3 mg/dL  7.2(L) 7.8(L) 9.1  Total Protein 6.5 - 8.1 g/dL 6.4(L) 5.9(L) 5.9(L)  Total Bilirubin 0.3 - 1.2 mg/dL 0.8 0.5 0.2  Alkaline Phos 38 - 126 U/L 46 56 48  AST 15 - 41 U/L 14(L) 16 15  ALT 0 - 44 U/L 10 14 8       RADIOGRAPHIC STUDIES: I have personally reviewed the radiological images as listed and agreed with the findings in the report. No results found.   ASSESSMENT & PLAN:  Audrey [name] is a 77 y.o. Caucasian female with   1. 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 -adrenal gland biopsy showed clear cell RCC -S/p CYtoreductive left radical nephrectomy and left adrenal gland resection on 08/01/2016 by Dr Alinda Money. -s/p left adrenalectomy with adrenal insufficiency and managed by Dr. Buddy Duty -She is on Xgeva injection to T7/8 and she is  on oral Sutent. She is tolerating well, hypertension and hypothyroidism has been managed well. -I reviewed and discussed her 10/16/17 PET with pt and her family in detail. Shows improvement of her T7 lesion, her pulmonary nodule is less apparent (likely benign) and no liver lesions seen. No other signs of metastatic disease. She does have distended gallbladder. She is overall pleased with results. --BP improved to 139/66 on amlodipine. She is also on coreg.  -Her Levothyroxine dose has been increased lately, managed by endocrinologist. Will repeat her TSH on next visit  -Labs reviewed, Ca at 7.2, AST at 14, Cr at 1.79, Hg at 10.4. I advised her to watch her lasix use and drink adequate water. I recommend she see a nephrologist. She was referred before but does not have an appointment yet  -Continue certain, she will follow-up with Dr. Lissa Hoard in a few weeks.  2. 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   -She has developed significant hypocalcemia, likely related to [facility].  We will hold Xgeva today  -I recommend  she start OTC calcium with VitD BID to improve her calcium level and then once daily once stable.   3.  Hypothyroidism -Endocrinologist Dr. Buddy Duty -Her liver Synthroid has increased lately, will repeat TSH on her next visit  4. HTN -Better controlled, continue Coreg and amlodipine  5. CKD stage IV -Status post left nephrectomy -She was referred to see nephrologist, but does not have appointment yet.  6.  Gallstone -She has had a few episodes of cholecystitis, she would like to have cholecystectomy -I recommend her to see Dr. Barry Dienes, she will think about it    PLAN:  -Scan reviewed, she has had a good response to treatment.   -Hold Xgeva injection today due to significant hypocalcemia -F/u on 7/23 with [name] with lab and injection    No orders of the defined types were placed in this encounter.  All questions were answered. The patient knows to call the clinic with any problems, questions or concerns. No barriers to learning was detected.  I spent 25 minutes counseling the patient face to face. The total time spent in the appointment was 30 minutes and more than 50% was on counseling and review of test results     Truitt Merle, MD 10/24/2017 11:29 AM   I, Joslyn Devon, am acting as scribe for Truitt Merle, MD.   I have reviewed the above documentation for accuracy and completeness, and I agree with the above.

## 2017-10-24 NOTE — Progress Notes (Signed)
Pt calcium level is 7.2 today. Spoke with pharmacy they do not recommend giving her xgeva today. Arrived to doctors ap.

## 2017-10-24 NOTE — Telephone Encounter (Signed)
Patient came in 7/5  And said appt was suppose to be today - appt rescheduled to 7/8 - pt stated she was unaware . Wanted to r/s inj and lab to today and have RN or MD to read results.  Dr. Burr Medico ( on call ) agreed to read results. - r/s appts from 7/8 to today 7/5  - appt with Kale rescheduled to 7/23 due to patient being out of town .

## 2017-10-24 NOTE — Telephone Encounter (Signed)
Called Blood bank and cancelled blood orders-entered in error.

## 2017-10-24 NOTE — Telephone Encounter (Signed)
Appointments scheduled and spoke with patient regarding appointments per 7/5 los

## 2017-10-25 ENCOUNTER — Encounter: Payer: Self-pay | Admitting: Hematology

## 2017-10-27 ENCOUNTER — Ambulatory Visit: Payer: Medicare Other

## 2017-10-27 ENCOUNTER — Ambulatory Visit: Payer: Medicare Other | Admitting: Hematology

## 2017-10-27 ENCOUNTER — Other Ambulatory Visit: Payer: Medicare Other

## 2017-11-07 ENCOUNTER — Telehealth: Payer: Self-pay | Admitting: *Deleted

## 2017-11-07 NOTE — Telephone Encounter (Signed)
Voicemail received from Audrey [name] requesting "Return call for symptoms reported yesterday.  Appreciate if someone would check into it and give me a call".   Message forwarded to [facility] for further patient communication.

## 2017-11-09 ENCOUNTER — Emergency Department: Payer: Medicare Other

## 2017-11-09 ENCOUNTER — Inpatient Hospital Stay
Admission: EM | Admit: 2017-11-09 | Discharge: 2017-11-13 | DRG: 445 | Disposition: A | Discharge status: Home / Self Care | Payer: Medicare Other | Attending: Internal Medicine | Admitting: Internal Medicine

## 2017-11-09 DIAGNOSIS — E861 Hypovolemia: Secondary | ICD-10-CM | POA: Diagnosis present

## 2017-11-09 DIAGNOSIS — K8000 Calculus of gallbladder with acute cholecystitis without obstruction: Principal | ICD-10-CM | POA: Diagnosis present

## 2017-11-09 DIAGNOSIS — E1122 Type 2 diabetes mellitus with diabetic chronic kidney disease: Secondary | ICD-10-CM | POA: Diagnosis present

## 2017-11-09 DIAGNOSIS — Z79890 Hormone replacement therapy (postmenopausal): Secondary | ICD-10-CM

## 2017-11-09 DIAGNOSIS — Z87891 Personal history of nicotine dependence: Secondary | ICD-10-CM

## 2017-11-09 DIAGNOSIS — Z905 Acquired absence of kidney: Secondary | ICD-10-CM

## 2017-11-09 DIAGNOSIS — C7951 Secondary malignant neoplasm of bone: Secondary | ICD-10-CM | POA: Diagnosis present

## 2017-11-09 DIAGNOSIS — E785 Hyperlipidemia, unspecified: Secondary | ICD-10-CM | POA: Diagnosis present

## 2017-11-09 DIAGNOSIS — E872 Acidosis: Secondary | ICD-10-CM | POA: Diagnosis present

## 2017-11-09 DIAGNOSIS — Z7984 Long term (current) use of oral hypoglycemic drugs: Secondary | ICD-10-CM

## 2017-11-09 DIAGNOSIS — E860 Dehydration: Secondary | ICD-10-CM | POA: Diagnosis present

## 2017-11-09 DIAGNOSIS — E039 Hypothyroidism, unspecified: Secondary | ICD-10-CM | POA: Diagnosis present

## 2017-11-09 DIAGNOSIS — E876 Hypokalemia: Secondary | ICD-10-CM | POA: Diagnosis present

## 2017-11-09 DIAGNOSIS — Z85528 Personal history of other malignant neoplasm of kidney: Secondary | ICD-10-CM

## 2017-11-09 DIAGNOSIS — N183 Chronic kidney disease, stage 3 (moderate): Secondary | ICD-10-CM | POA: Diagnosis present

## 2017-11-09 DIAGNOSIS — T451X5A Adverse effect of antineoplastic and immunosuppressive drugs, initial encounter: Secondary | ICD-10-CM | POA: Diagnosis present

## 2017-11-09 DIAGNOSIS — K521 Toxic gastroenteritis and colitis: Secondary | ICD-10-CM | POA: Diagnosis present

## 2017-11-09 DIAGNOSIS — K819 Cholecystitis, unspecified: Secondary | ICD-10-CM

## 2017-11-09 DIAGNOSIS — I129 Hypertensive chronic kidney disease with stage 1 through stage 4 chronic kidney disease, or unspecified chronic kidney disease: Secondary | ICD-10-CM | POA: Diagnosis present

## 2017-11-09 DIAGNOSIS — E871 Hypo-osmolality and hyponatremia: Secondary | ICD-10-CM | POA: Diagnosis present

## 2017-11-09 DIAGNOSIS — N179 Acute kidney failure, unspecified: Secondary | ICD-10-CM | POA: Diagnosis present

## 2017-11-09 DIAGNOSIS — C7800 Secondary malignant neoplasm of unspecified lung: Secondary | ICD-10-CM | POA: Diagnosis present

## 2017-11-09 HISTORY — DX: Disorder of thyroid, unspecified: E07.9

## 2017-11-09 HISTORY — DX: Hyperlipidemia, unspecified: E78.5

## 2017-11-09 HISTORY — DX: Malignant neoplasm of unspecified kidney, except renal pelvis: C64.9

## 2017-11-09 HISTORY — DX: Type 2 diabetes mellitus without complications: E11.9

## 2017-11-09 HISTORY — DX: Calculus of gallbladder without cholecystitis without obstruction: K80.20

## 2017-11-09 HISTORY — DX: Essential (primary) hypertension: I10

## 2017-11-09 LAB — CBC AND DIFFERENTIAL
Absolute NRBC: 0 10*3/uL (ref 0.00–0.00)
Basophils Absolute Automated: 0.03 10*3/uL (ref 0.00–0.08)
Basophils Automated: 0.3 %
Eosinophils Absolute Automated: 0.02 10*3/uL (ref 0.00–0.44)
Eosinophils Automated: 0.2 %
Hematocrit: 35.1 % (ref 34.7–43.7)
Hgb: 11.9 g/dL (ref 11.4–14.8)
Immature Granulocytes Absolute: 0.07 10*3/uL (ref 0.00–0.07)
Immature Granulocytes: 0.6 %
Lymphocytes Absolute Automated: 1.31 10*3/uL (ref 0.42–3.22)
Lymphocytes Automated: 11.8 %
MCH: 33.5 pg (ref 25.1–33.5)
MCHC: 33.9 g/dL (ref 31.5–35.8)
MCV: 98.9 fL — ABNORMAL HIGH (ref 78.0–96.0)
MPV: 9.5 fL (ref 8.9–12.5)
Monocytes Absolute Automated: 0.74 10*3/uL (ref 0.21–0.85)
Monocytes: 6.7 %
Neutrophils Absolute: 8.92 10*3/uL — ABNORMAL HIGH (ref 1.10–6.33)
Neutrophils: 80.4 %
Nucleated RBC: 0 /100 WBC (ref 0.0–0.0)
Platelets: 426 10*3/uL — ABNORMAL HIGH (ref 142–346)
RBC: 3.55 10*6/uL — ABNORMAL LOW (ref 3.90–5.10)
RDW: 15 % (ref 11–15)
WBC: 11.09 10*3/uL — ABNORMAL HIGH (ref 3.10–9.50)

## 2017-11-09 LAB — LIPASE: Lipase: 137 U/L — ABNORMAL HIGH (ref 8–78)

## 2017-11-09 LAB — URINALYSIS, REFLEX TO MICROSCOPIC EXAM IF INDICATED
Bilirubin, UA: NEGATIVE
Blood, UA: NEGATIVE
Glucose, UA: NEGATIVE
Ketones UA: NEGATIVE
Nitrite, UA: NEGATIVE
Protein, UR: 30 — AB
Specific Gravity UA: 1.019 (ref 1.001–1.035)
Urine pH: 5 (ref 5.0–8.0)
Urobilinogen, UA: NORMAL mg/dL

## 2017-11-09 LAB — BASIC METABOLIC PANEL
Anion Gap: 19 — ABNORMAL HIGH (ref 5.0–15.0)
Anion Gap: 17 — ABNORMAL HIGH (ref 5.0–15.0)
BUN: 52 mg/dL — ABNORMAL HIGH (ref 7–19)
BUN: 49 mg/dL — ABNORMAL HIGH (ref 7–19)
CO2: 21 mEq/L — ABNORMAL LOW (ref 22–29)
CO2: 18 mEq/L — ABNORMAL LOW (ref 22–29)
Calcium: 7.5 mg/dL — ABNORMAL LOW (ref 7.9–10.2)
Calcium: 6.8 mg/dL — ABNORMAL LOW (ref 7.9–10.2)
Chloride: 92 mEq/L — ABNORMAL LOW (ref 100–111)
Chloride: 97 mEq/L — ABNORMAL LOW (ref 100–111)
Creatinine: 3.1 mg/dL — ABNORMAL HIGH (ref 0.6–1.0)
Creatinine: 2.6 mg/dL — ABNORMAL HIGH (ref 0.6–1.0)
Glucose: 143 mg/dL — ABNORMAL HIGH (ref 70–100)
Glucose: 128 mg/dL — ABNORMAL HIGH (ref 70–100)
Potassium: 2.9 mEq/L — ABNORMAL LOW (ref 3.5–5.1)
Potassium: 2.4 mEq/L — CL (ref 3.5–5.1)
Sodium: 132 mEq/L — ABNORMAL LOW (ref 136–145)
Sodium: 132 mEq/L — ABNORMAL LOW (ref 136–145)

## 2017-11-09 LAB — HEPATIC FUNCTION PANEL
ALT: 17 U/L (ref 0–55)
AST (SGOT): 22 U/L (ref 5–34)
Albumin/Globulin Ratio: 1.1 (ref 0.9–2.2)
Albumin: 3.8 g/dL (ref 3.5–5.0)
Alkaline Phosphatase: 90 U/L (ref 37–106)
Bilirubin Direct: 0.2 mg/dL (ref 0.0–0.5)
Bilirubin Indirect: 0.2 mg/dL (ref 0.2–1.0)
Bilirubin, Total: 0.4 mg/dL (ref 0.2–1.2)
Globulin: 3.4 g/dL (ref 2.0–3.6)
Protein, Total: 7.2 g/dL (ref 6.0–8.3)

## 2017-11-09 LAB — GLUCOSE WHOLE BLOOD - POCT: Whole Blood Glucose POCT: 152 mg/dL — ABNORMAL HIGH (ref 70–100)

## 2017-11-09 LAB — GFR
EGFR: 14.6
EGFR: 17.9

## 2017-11-09 MED ORDER — ACETAMINOPHEN 325 MG PO TABS
650.00 mg | ORAL_TABLET | Freq: Four times a day (QID) | ORAL | Status: DC | PRN
Start: 2017-11-09 — End: 2017-11-13
  Administered 2017-11-10: 650 mg via ORAL
  Filled 2017-11-09: qty 2

## 2017-11-09 MED ORDER — ONDANSETRON HCL 4 MG/2ML IJ SOLN
4.00 mg | Freq: Once | INTRAMUSCULAR | Status: AC
Start: 2017-11-09 — End: 2017-11-09
  Administered 2017-11-09: 4 mg via INTRAVENOUS
  Filled 2017-11-09: qty 2

## 2017-11-09 MED ORDER — SODIUM CHLORIDE 0.9 % IV BOLUS
500.00 mL | Freq: Once | INTRAVENOUS | Status: AC
Start: 2017-11-09 — End: 2017-11-09
  Administered 2017-11-09 (×2): 500 mL via INTRAVENOUS

## 2017-11-09 MED ORDER — MORPHINE SULFATE 2 MG/ML IJ/IV SOLN (WRAP)
2.0000 mg | Freq: Once | Status: AC
Start: 2017-11-09 — End: 2017-11-09
  Administered 2017-11-09: 2 mg via INTRAVENOUS
  Filled 2017-11-09: qty 1

## 2017-11-09 MED ORDER — SODIUM CHLORIDE 0.9 % IV SOLN
INTRAVENOUS | Status: DC
Start: 2017-11-09 — End: 2017-11-12

## 2017-11-09 MED ORDER — PANTOPRAZOLE SODIUM 40 MG PO TBEC
40.00 mg | DELAYED_RELEASE_TABLET | Freq: Every evening | ORAL | Status: DC
Start: 2017-11-09 — End: 2017-11-13
  Administered 2017-11-09 – 2017-11-12 (×4): 40 mg via ORAL
  Filled 2017-11-09 (×4): qty 1

## 2017-11-09 MED ORDER — LEVOTHYROXINE SODIUM 50 MCG PO TABS
50.00 ug | ORAL_TABLET | Freq: Every day | ORAL | Status: DC
Start: 2017-11-10 — End: 2017-11-13
  Administered 2017-11-10 – 2017-11-13 (×4): 50 ug via ORAL
  Filled 2017-11-09 (×4): qty 1

## 2017-11-09 MED ORDER — HYDROCORTISONE 5 MG PO TABS
10.00 mg | ORAL_TABLET | Freq: Two times a day (BID) | ORAL | Status: DC
Start: 2017-11-09 — End: 2017-11-13
  Administered 2017-11-09 – 2017-11-13 (×8): 10 mg via ORAL
  Filled 2017-11-09 (×9): qty 2

## 2017-11-09 MED ORDER — ATORVASTATIN CALCIUM 20 MG PO TABS
40.00 mg | ORAL_TABLET | Freq: Every evening | ORAL | Status: DC
Start: 2017-11-09 — End: 2017-11-13
  Administered 2017-11-09 – 2017-11-12 (×4): 40 mg via ORAL
  Filled 2017-11-09 (×4): qty 2

## 2017-11-09 MED ORDER — SODIUM CHLORIDE 0.9 % IV BOLUS
500.00 mL | Freq: Once | INTRAVENOUS | Status: AC
Start: 2017-11-09 — End: 2017-11-09
  Administered 2017-11-09: 500 mL via INTRAVENOUS

## 2017-11-09 MED ORDER — POTASSIUM CHLORIDE CRYS ER 20 MEQ PO TBCR
40.00 meq | EXTENDED_RELEASE_TABLET | Freq: Once | ORAL | Status: AC
Start: 2017-11-09 — End: 2017-11-09
  Administered 2017-11-09: 40 meq via ORAL
  Filled 2017-11-09: qty 2

## 2017-11-09 MED ORDER — PIPERACILLIN SOD-TAZOBACTAM SO 2.25 (2-0.25) G IV SOLR
2.25 g | Freq: Three times a day (TID) | INTRAVENOUS | Status: DC
Start: 2017-11-09 — End: 2017-11-13
  Administered 2017-11-09 – 2017-11-13 (×11): 2.25 g via INTRAVENOUS
  Filled 2017-11-09 (×12): qty 2.25

## 2017-11-09 MED ORDER — PROCHLORPERAZINE MALEATE 5 MG PO TABS
10.00 mg | ORAL_TABLET | Freq: Four times a day (QID) | ORAL | Status: DC | PRN
Start: 2017-11-09 — End: 2017-11-13

## 2017-11-09 MED ORDER — CARVEDILOL 6.25 MG PO TABS
6.25 mg | ORAL_TABLET | Freq: Two times a day (BID) | ORAL | Status: DC
Start: 2017-11-09 — End: 2017-11-13
  Administered 2017-11-09 – 2017-11-13 (×7): 6.25 mg via ORAL
  Filled 2017-11-09 (×8): qty 1

## 2017-11-09 MED ORDER — PANTOPRAZOLE SODIUM 40 MG PO TBEC
40.00 mg | DELAYED_RELEASE_TABLET | Freq: Every morning | ORAL | Status: DC
Start: 2017-11-10 — End: 2017-11-09

## 2017-11-09 MED ORDER — POTASSIUM CHLORIDE 10 MEQ/100ML IV SOLN (WRAP)
10.0000 meq | INTRAVENOUS | Status: AC
Start: 2017-11-09 — End: 2017-11-09
  Administered 2017-11-09 (×2): 10 meq via INTRAVENOUS
  Filled 2017-11-09 (×2): qty 200

## 2017-11-09 MED ORDER — ONDANSETRON 4 MG PO TBDP
4.00 mg | ORAL_TABLET | Freq: Four times a day (QID) | ORAL | Status: DC | PRN
Start: 2017-11-09 — End: 2017-11-13

## 2017-11-09 MED ORDER — NALOXONE HCL 0.4 MG/ML IJ SOLN (WRAP)
0.20 mg | INTRAMUSCULAR | Status: DC | PRN
Start: 2017-11-09 — End: 2017-11-13

## 2017-11-09 MED ORDER — AMLODIPINE BESYLATE 5 MG PO TABS
5.00 mg | ORAL_TABLET | Freq: Every day | ORAL | Status: DC
Start: 2017-11-09 — End: 2017-11-13
  Administered 2017-11-09 – 2017-11-12 (×4): 5 mg via ORAL
  Filled 2017-11-09 (×4): qty 1

## 2017-11-09 MED ORDER — ONDANSETRON HCL 4 MG/2ML IJ SOLN
4.00 mg | Freq: Four times a day (QID) | INTRAMUSCULAR | Status: DC | PRN
Start: 2017-11-09 — End: 2017-11-09

## 2017-11-09 MED ORDER — TRAMADOL HCL 50 MG PO TABS
50.00 mg | ORAL_TABLET | Freq: Four times a day (QID) | ORAL | Status: DC | PRN
Start: 2017-11-09 — End: 2017-11-13

## 2017-11-09 MED ORDER — MORPHINE SULFATE 2 MG/ML IJ/IV SOLN (WRAP)
2.0000 mg | Status: AC | PRN
Start: 2017-11-09 — End: 2017-11-10

## 2017-11-09 MED ORDER — ONDANSETRON HCL 4 MG/2ML IJ SOLN
4.00 mg | Freq: Four times a day (QID) | INTRAMUSCULAR | Status: DC | PRN
Start: 2017-11-09 — End: 2017-11-13
  Administered 2017-11-09: 4 mg via INTRAVENOUS
  Filled 2017-11-09: qty 2

## 2017-11-09 NOTE — ED Provider Notes (Signed)
Physician/Midlevel provider first contact with patient: 11/09/17 1214       [facility] EMERGENCY DEPARTMENT HISTORY AND PHYSICAL EXAM    Patient Name: [name] [name], [name] [name]  Encounter Date:  11/09/2017  Rendering Provider: Domingo Pulse, DO  Patient DOB:  [birth year]  MRN:  [id number]    History of Presenting Illness     Chief Complaint:   Chief Complaint   Patient presents with   . Emesis       The patient [name] [name], is a 77 y.o. female who presents with chief complaint of Vomiting.  Patient has history of active renal cancer, is on chemotherapy presents with one-week history of decreased appetite, recurrent right upper quadrant abdominal pain and 24 hours of nausea and vomiting, describing nonbloody, nonbilious vomiting. Patient denies fevers or chills.  Patient had taken chemotherapy yesterday and since then she has had nausea associated with vomiting.  Patient states she has known gallstones and states she suspects pain in her right upper heart of her abdomen is related to her gallbladder.  Patient states she has also had decreased appetite since she has been on chemotherapy.  Patient denies other associated complaints.     PCP:  Bobby Rumpf, MD    Past Medical History     Past Medical History:   Diagnosis Date   . Diabetes mellitus    . Disorder of thyroid    . Gallstone    . Hyperlipidemia    . Hypertension    . Renal cancer        Past Surgical History     Past Surgical History:   Procedure Laterality Date   . NEPHRECTOMY         Family History     No family history on file.    Social History     Social History     Social History   . Marital status: Widowed     Spouse name: N/A   . Number of children: N/A   . Years of education: N/A     Social History Main Topics   . Smoking status: Not on file   . Smokeless tobacco: Not on file   . Alcohol use Not on file   . Drug use: Unknown   . Sexual activity: Not on file     Other Topics Concern   . Not on file     Social History Narrative   . No narrative on file        Home Medications     Home medications reviewed by ED MD    Previous Medications    AMLODIPINE (NORVASC) 5 MG TABLET    Take 5 mg by mouth daily    ATORVASTATIN (LIPITOR) 40 MG TABLET    Take 40 mg by mouth every evening        CALCIUM 600-200 MG-UNIT PER TABLET    Take 1 tablet by mouth daily    CARVEDILOL (COREG) 6.25 MG TABLET    Take 6.25 mg by mouth 2 (two) times daily with meals        DENOSUMAB (XGEVA SC)    Inject into the skin Every six weeks       HYDROCORTISONE (CORTEF) 5 MG TABLET    Take 10 mg by mouth 2 (two) times daily        LEVOTHYROXINE (SYNTHROID, LEVOTHROID) 50 MCG TABLET    Take 50 mcg by mouth Once a day at 6:00am    PANTOPRAZOLE (PROTONIX) 20 MG TABLET  Take 20 mg by mouth every evening        PROCHLORPERAZINE (COMPAZINE) 10 MG TABLET    Take 10 mg by mouth every 6 (six) hours as needed    SUNITINIB (SUTENT) 50 MG CAPSULE    Take 37.5 mg by mouth daily For 2 weeks on and 1 week off.       TRAMADOL (ULTRAM) 50 MG TABLET    Take 50 mg by mouth every 6 (six) hours as needed for Pain       Review of Systems     Review of Systems   Constitutional: Negative for fever and chills.   HENT: Negative for sore throat.   Cardiovascular: Negative for chest pain.   Respiratory: Negative for cough and shortness of breath.   Gastrointestinal: Negative for  diarrhea.  Genitourinary: No dysuria.   Musculoskeletal: No back pain.  Skin: Negative for rash.   Neuro: Negative for syncope, headache.   All other systems reviewed and are negative.      Physical Exam       GENERAL: Vital signs reviewed. No apparent distress, Mucous membranes dry    HEAD:  Normal cephalic, atraumatic  EYES: pupils equal round reactive, extra ocular muscles intact  MOUTH: No trismus, uvula midline, oropharynx clear, mucous membranes moist  NECK: Supple  HEART: regular rate, no murmur  LUNGS: Bilateral clear to auscultation  ABDOMEN: Soft, Non-tender,nondistended, Right upper quadrant tenderness, negative Murphy's and McBurney point  tenderness  EXTREMITIES: No edema, no calve tenderness, no cyanosis  NEUROLOGIC: Alert and orientated, normal speech, moves all extremities  SKIN:  Warm, Dry, No rash    ED Medications Administered     ED Medication Orders     Start Ordered     Status Ordering Provider    11/09/17 1707 11/09/17 1706  ondansetron (ZOFRAN) injection 4 mg  Once     Route: Intravenous  Ordered Dose: 4 mg     Last MAR action:  Given Domingo Pulse    11/09/17 1227 11/09/17 1226  sodium chloride 0.9 % bolus 500 mL  Once     Route: Intravenous  Ordered Dose: 500 mL     Last MAR action:  William Hamburger    11/09/17 1227 11/09/17 1226  ondansetron (ZOFRAN) injection 4 mg  Once     Route: Intravenous  Ordered Dose: 4 mg     Last MAR action:  Given Domingo Pulse    11/09/17 1227 11/09/17 1226  morphine injection 2 mg  Once     Route: Intravenous  Ordered Dose: 2 mg     Last MAR action:  Given Domingo Pulse    11/09/17 1214 11/09/17 1213  sodium chloride 0.9 % bolus 500 mL  Once     Route: Intravenous  Ordered Dose: 500 mL     Last MAR action:  Stopped Aaron Mose M          Orders Placed During This Encounter     Orders Placed This Encounter   Procedures   . US Abdomen Limited RUQ   . Basic Metabolic Panel   . CBC and differential   . UA, Reflex to Microscopic (pts 3 + yrs)   . GFR   . Hepatic function panel (LFT)   . Lipase   . Diet NPO effective now   . Saline lock IV   . Admit to Inpatient   . Einar Gip ED Bed Request (Inpatient)  Diagnostic Study Results     Labs  Results     Procedure Component Value Units Date/Time    UA, Reflex to Microscopic (pts 3 + yrs) [[id number]]  (Abnormal) Collected:  11/09/17 1605    Specimen:  Urine Updated:  11/09/17 1634     Urine Type Clean Catch     Color, UA Amber (A)     Clarity, UA Hazy     Specific Gravity UA 1.019     Urine pH 5.0     Leukocyte Esterase, UA Large (A)     Nitrite, UA Negative     Protein, UR 30 (A)     Glucose, UA Negative     Ketones UA Negative      Urobilinogen, UA Normal mg/dL      Bilirubin, UA Negative     Blood, UA Negative     RBC, UA 6 - 10 (A) /hpf      WBC, UA 11 - 25 (A) /hpf      Squamous Epithelial Cells, Urine 11 - 25 /hpf      Hyaline Casts, UA 0 - 3 /lpf     CBC and differential [[id number]]  (Abnormal) Collected:  11/09/17 1223    Specimen:  Blood from Blood Updated:  11/09/17 1307     WBC 11.09 (H) x10 3/uL      Hgb 11.9 g/dL      Hematocrit 46.9 %      Platelets 426 (H) x10 3/uL      RBC 3.55 (L) x10 6/uL      MCV 98.9 (H) fL      MCH 33.5 pg      MCHC 33.9 g/dL      RDW 15 %      MPV 9.5 fL      Neutrophils 80.4 %      Lymphocytes Automated 11.8 %      Monocytes 6.7 %      Eosinophils Automated 0.2 %      Basophils Automated 0.3 %      Immature Granulocyte 0.6 %      Nucleated RBC 0.0 /100 WBC      Neutrophils Absolute 8.92 (H) x10 3/uL      Abs Lymph Automated 1.31 x10 3/uL      Abs Mono Automated 0.74 x10 3/uL      Abs Eos Automated 0.02 x10 3/uL      Absolute Baso Automated 0.03 x10 3/uL      Absolute Immature Granulocyte 0.07 x10 3/uL      Absolute NRBC 0.00 x10 3/uL     Lipase [[id number]]  (Abnormal) Collected:  11/09/17 1223     Updated:  11/09/17 1251     Lipase 137 (H) U/L     Basic Metabolic Panel [[id number]]  (Abnormal) Collected:  11/09/17 1223    Specimen:  Blood Updated:  11/09/17 1251     Glucose 143 (H) mg/dL      BUN 52 (H) mg/dL      Creatinine 3.1 (H) mg/dL      Calcium 7.5 (L) mg/dL      Sodium 536 (L) mEq/L      Potassium 2.9 (L) mEq/L      Chloride 92 (L) mEq/L      CO2 21 (L) mEq/L      Anion Gap 19.0 (H)    GFR [[id number]] Collected:  11/09/17 1223     Updated:  11/09/17 1251     EGFR 14.6  Hepatic function panel (LFT) [[id number]] Collected:  11/09/17 1223     Updated:  11/09/17 1251     Bilirubin, Total 0.4 mg/dL      Bilirubin, Direct 0.2 mg/dL      Bilirubin, Indirect 0.2 mg/dL      AST (SGOT) 22 U/L      ALT 17 U/L      Alkaline Phosphatase 90 U/L      Protein, Total 7.2 g/dL      Albumin 3.8 g/dL      Globulin 3.4  g/dL      Albumin/Globulin Ratio 1.1          Radiologic Studies  Radiology Results (24 Hour)     Procedure Component Value Units Date/Time    US Abdomen Limited RUQ [[id number]] Collected:  11/09/17 1535    Order Status:  Completed Updated:  11/09/17 1545    Narrative:       History: Abdominal pain.    FINDINGS: Ultrasound right upper quadrant was performed.    There are gallstones and sludge in the gallbladder. There is minimal  gallbladder wall thickening and minimal pericholecystic fluid. The  patient was not tender over the gallbladder, but she had received pain  medication prior to the exam. This raises the possibility of mild  cholecystitis.    The common duct and intrahepatic bile ducts are dilated. The common duct  measures 11 mm in diameter. The cause of dilatation is not identified on  this study.    There is a 1.1 cm hypoechoic lesion in the body of the pancreas which is  indeterminate. The pancreatic duct is not dilated. The liver shows  homogeneous echotexture. The right kidney contains small cysts without  evidence of hydronephrosis. The abdominal aorta and IVC are  unremarkable.      Impression:         1. Gallstones and sludge in the gallbladder, with minimal wall  thickening and minimal pericholecystic fluid raising the possibility of  mild cholecystitis.  2. Moderate biliary dilatation to the level of the distal common duct.  The cause of dilatation is not identified on this study, and could be  further evaluated by MRCP.  3. 1.1 cm hypoechoic lesion in the body of the pancreas which is  indeterminate. Recommend further evaluation with contrast-enhanced MRI.    COMMENT: This urgent result was discussed with and acknowledged by Dr.  Ladona Ridgel, at 3:40 PM hours on 11/09/2017.    Lanier Ensign, MD   11/09/2017 3:41 PM          Rendering Provider: Domingo Pulse, DO      Monitors, EKG, Critical Care, and Splints           MDM and Clinical Notes     Notes: Patient has numerous electrolyte abnormalities.   She also appears clinically dehydrated.  Discussion with patient, she feels her baseline renal function is 1.7.  Patient also has recurrent abdominal pain, however, she is without Murphy sign on my initial evaluation and also absent.  Pain on ultrasound imaging.  Repeat abdominal exam is benign.  I do not suspect patient has acute cholecystitis clinically.  Patient does have evidence of dilated intrahepatic ducts, which would require further evaluation, possibly by gastroenterology for ERCP/MRCP.  Patient was admitted to medical service, Dr. Mayford Knife after brief discussion, agrees to exam patient to their service.      Diagnosis and Disposition     Clinical Impression  1. Dehydration  2. Acute renal failure, unspecified acute renal failure type    3. Hypokalemia    4. Cholecystitis        Disposition  ED Disposition     ED Disposition Condition Date/Time Comment    Admit  Sun Nov 09, 2017  4:13 PM Admitting Physician: Doylene Bode [[id number]]   Diagnosis: Dehydration [276.51.ICD-9-CM]   Estimated Length of Stay: > or = to 2 midnights   Tentative Discharge Plan?: Home or Self Care [1]   Patient Class: Inpatient [101]   Bed request comments: vomiting on chemo            Prescriptions       New Prescriptions    No medications on file                      Domingo Pulse, DO  11/09/17 1719

## 2017-11-09 NOTE — ED Triage Notes (Signed)
Vomiting and diarrhea x 1 week.  Taking daily chemo for renal ca

## 2017-11-09 NOTE — H&P (Addendum)
[facility]      Patient: [name] [name]  Date: 11/09/2017   DOB: [birth year]  Admission Date: 11/09/2017   MRN: [id number]  Attending: Jannifer Franklin, MD       Chief Complaint   Patient presents with   . Emesis      I personally saw and examined pt. I have personally discussed the case with NP.  I have reviewed the labs, xrays, physical examination and note by NP.  I agree with assessment and plan as noted by Angelique Holm, PA.    On exam:   Awake, alert, not in acute distress  HEENT:  PERRLA, moist mucous membranes, (-) tonsillar congestion  Neck:  Supple  Chest/Lungs:  CTAB, (-) rales, (-) wheezes  CVS:  Distinct S1 and S2, NRRR, (-) murmurs  Abdomen:  Flat, NABS, soft, NT  Ext:  (-) edema  Neuro:  Non focal    Labs reviewed.       Active Hospital Problems    Diagnosis   . Dehydration     A/P  Cholelithiasis with possible acute cholecystitis.  Continue with Zosyn.  MRCP.  GI and surgery consult in am.    AKI on CKD.  Most likely secondary to dehydration.  IV hydration.  Recheck BMP in am.  Nephrology consult in am.    Electrolyte Abnormalities  Most likely secondary to n/v/d.  Replete K, replete Mg.  Patient has received 3gm Mg IV.  Will hold further doses and recheck Mg.  Can give more if necessary.    Recheck.      Signed,  Alphonzo Dublin, MD  5:38 AM 11/10/2017       History Gathered From: Self    HISTORY AND PHYSICAL     [name] [name] is a 77 y.o. female with a PMHx of renal cell carcinoma with pulmonary, bony and adrenal mets-s/p Left nephrectomy and left adrenal gland removal on Chemo, HLD, HTN, hypothyroidism, DM II who presented with complaints of vomiting. She reports a one week history of decreased appetite, recurrent RUQ pain and 24-48 hours of vomiting. 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  She has a known history of gallstones per a PET Scan on 10/16/2017 which showed gallstones with gallbladder wall thickening. It was recommended to her at that time that see surgery  for a cholecystectomy. We are asked to admit for treatment.     Past Medical History:   Diagnosis Date   . Diabetes mellitus    . Disorder of thyroid    . Gallstone    . Hyperlipidemia    . Hypertension    . Renal cancer        Past Surgical History:   Procedure Laterality Date   . NEPHRECTOMY         Prior to Admission medications    Medication Sig Start Date End Date Taking? Authorizing Provider   amLODIPine (NORVASC) 5 MG tablet Take 5 mg by mouth daily   Yes [provider]   atorvastatin (LIPITOR) 40 MG tablet Take 40 mg by mouth every evening       Yes [provider]   Calcium 600-200 MG-UNIT per tablet Take 1 tablet by mouth daily   Yes [provider]   carvedilol (COREG) 6.25 MG tablet Take 6.25 mg by mouth 2 (two) times daily with meals       Yes [provider]   Denosumab (XGEVA SC) Inject into the skin Every six weeks  Yes [provider]   hydrocortisone (CORTEF) 5 MG tablet Take 10 mg by mouth 2 (two) times daily       Yes [provider]   levothyroxine (SYNTHROID, LEVOTHROID) 50 MCG tablet Take 50 mcg by mouth Once a day at 6:00am   Yes [provider]   pantoprazole (PROTONIX) 20 MG tablet Take 20 mg by mouth every evening       Yes [provider]   prochlorperazine (COMPAZINE) 10 MG tablet Take 10 mg by mouth every 6 (six) hours as needed   Yes [provider]   SUNItinib (SUTENT) 50 MG capsule Take 37.5 mg by mouth daily For 2 weeks on and 1 week off.      Yes [provider]   traMADol (ULTRAM) 50 MG tablet Take 50 mg by mouth every 6 (six) hours as needed for Pain   Yes [provider]   acetaminophen (TYLENOL) 500 MG tablet Take 500 mg by mouth  11/09/17 Yes [provider]       No Known Allergies    CODE STATUS: full code    PRIMARY CARE MD: Pcp, [facility], MD    History reviewed. No pertinent family history.    Social History   Substance Use Topics   . Smoking status: Former  Smoker     Quit date: 11/10/1967   . Smokeless tobacco: Never Used   . Alcohol use Not on file       REVIEW OF SYSTEMS     Ten point review of systems negative or as per HPI and below endorsements.    PHYSICAL EXAM     Vital Signs (most recent): BP 160/79   Pulse 66   Temp 98.2 F (36.8 C) (Oral)   Resp 15   Ht 1.499 m (4\' 11" )   Wt 62 kg (136 lb 9.6 oz)   SpO2 97%   BMI 27.59 kg/m   Constitutional: No apparent distress.  Patient speaks freely in full sentences. Very pleasant.  HEENT: NC/AT, PERRL, no scleral icterus or conjunctival pallor, no nasal discharge, MMM, oropharynx without erythema or exudate. Left eye draining clear fluid.  Neck: trachea midline, supple, no cervical or supraclavicular lymphadenopathy or masses  Cardiovascular: RRR, normal S1 S2, no murmurs, gallops, palpable thrills, no JVD, Non-displaced PMI.  Respiratory: Normal rate. No retractions or increased work of breathing. Clear to auscultation bilaterally.  Gastrointestinal: +BS, non-distended, soft, +Murphy sign. RUQ pain with palpation.  no rebound or guarding, no hepatosplenomegaly  Genitourinary: no suprapubic or costovertebral angle tenderness  Musculoskeletal: ROM and motor strength grossly normal. No clubbing, edema, or cyanosis. DP and radial pulses 2+ and symmetric.  Skin: no rashes, jaundice or other lesions. Good capillary refill noted bilaterally.  Neurologic: EOMI, CN 2-12 grossly intact. no gross motor or sensory deficits  Psychiatric: AAOx3, affect and mood appropriate. The patient is alert, interactive, appropriate.    LABS & IMAGING     Recent Results (from the past 24 hour(s))   Basic Metabolic Panel    Collection Time: 11/09/17 12:23 PM   Result Value Ref Range    Glucose 143 (H) 70 - 100 mg/dL    BUN 52 (H) 7 - 19 mg/dL    Creatinine 3.1 (H) 0.6 - 1.0 mg/dL    Calcium 7.5 (L) 7.9 - 10.2 mg/dL    Sodium 161 (L) 096 - 145 mEq/L    Potassium 2.9 (L) 3.5 - 5.1 mEq/L    Chloride 92 (L)  100 - 111 mEq/L    CO2 21 (L) 22 -  29 mEq/L    Anion Gap 19.0 (H) 5.0 - 15.0   CBC and differential    Collection Time: 11/09/17 12:23 PM   Result Value Ref Range    WBC 11.09 (H) 3.10 - 9.50 x10 3/uL    Hgb 11.9 11.4 - 14.8 g/dL    Hematocrit 16.1 09.6 - 43.7 %    Platelets 426 (H) 142 - 346 x10 3/uL    RBC 3.55 (L) 3.90 - 5.10 x10 6/uL    MCV 98.9 (H) 78.0 - 96.0 fL    MCH 33.5 25.1 - 33.5 pg    MCHC 33.9 31.5 - 35.8 g/dL    RDW 15 11 - 15 %    MPV 9.5 8.9 - 12.5 fL    Neutrophils 80.4 None %    Lymphocytes Automated 11.8 None %    Monocytes 6.7 None %    Eosinophils Automated 0.2 None %    Basophils Automated 0.3 None %    Immature Granulocyte 0.6 None %    Nucleated RBC 0.0 0.0 - 0.0 /100 WBC    Neutrophils Absolute 8.92 (H) 1.10 - 6.33 x10 3/uL    Abs Lymph Automated 1.31 0.42 - 3.22 x10 3/uL    Abs Mono Automated 0.74 0.21 - 0.85 x10 3/uL    Abs Eos Automated 0.02 0.00 - 0.44 x10 3/uL    Absolute Baso Automated 0.03 0.00 - 0.08 x10 3/uL    Absolute Immature Granulocyte 0.07 0.00 - 0.07 x10 3/uL    Absolute NRBC 0.00 0.00 - 0.00 x10 3/uL   GFR    Collection Time: 11/09/17 12:23 PM   Result Value Ref Range    EGFR 14.6    Hepatic function panel (LFT)    Collection Time: 11/09/17 12:23 PM   Result Value Ref Range    Bilirubin, Total 0.4 0.2 - 1.2 mg/dL    Bilirubin, Direct 0.2 0.0 - 0.5 mg/dL    Bilirubin, Indirect 0.2 0.2 - 1.0 mg/dL    AST (SGOT) 22 5 - 34 U/L    ALT 17 0 - 55 U/L    Alkaline Phosphatase 90 37 - 106 U/L    Protein, Total 7.2 6.0 - 8.3 g/dL    Albumin 3.8 3.5 - 5.0 g/dL    Globulin 3.4 2.0 - 3.6 g/dL    Albumin/Globulin Ratio 1.1 0.9 - 2.2   Lipase    Collection Time: 11/09/17 12:23 PM   Result Value Ref Range    Lipase 137 (H) 8 - 78 U/L   UA, Reflex to Microscopic (pts 3 + yrs)    Collection Time: 11/09/17  4:05 PM   Result Value Ref Range    Urine Type Clean Catch     Color, UA Amber (A) Clear - Yellow    Clarity, UA Hazy Clear - Hazy    Specific Gravity UA 1.019 1.001 - 1.035    Urine pH 5.0 5.0 - 8.0    Leukocyte Esterase,  UA Large (A) Negative    Nitrite, UA Negative Negative    Protein, UR 30 (A) Negative    Glucose, UA Negative Negative    Ketones UA Negative Negative    Urobilinogen, UA Normal 0.2 - 2.0 mg/dL    Bilirubin, UA Negative Negative    Blood, UA Negative Negative    RBC, UA 6 - 10 (A) 0 - 5 /hpf    WBC, UA 11 -  25 (A) 0 - 5 /hpf    Squamous Epithelial Cells, Urine 11 - 25 0 - 25 /hpf    Hyaline Casts, UA 0 - 3 0 - 3 /lpf       MICROBIOLOGY:  Blood Culture: not done  Urine Culture: done  Antibiotics Started: Zosyn    IMAGING (images personally reviewed and concur with radiologist unless otherwise stated below):  US Abdomen Limited RUQ   Final Result      1. Gallstones and sludge in the gallbladder, with minimal wall   thickening and minimal pericholecystic fluid raising the possibility of   mild cholecystitis.   2. Moderate biliary dilatation to the level of the distal common duct.   The cause of dilatation is not identified on this study, and could be   further evaluated by MRCP.   3. 1.1 cm hypoechoic lesion in the body of the pancreas which is   indeterminate. Recommend further evaluation with contrast-enhanced MRI.      COMMENT: This urgent result was discussed with and acknowledged by Dr.   Ladona Ridgel, at 3:40 PM hours on 11/09/2017.      Lanier Ensign, MD    11/09/2017 3:41 PM      MRI Abdomen WO Contrast MRCP    (Results Pending)       EMERGENCY DEPARTMENT COURSE:  Orders Placed This Encounter   Procedures   . Adult Urine culture   . US Abdomen Limited RUQ   . MRI Abdomen WO Contrast MRCP   . Basic Metabolic Panel   . CBC and differential   . UA, Reflex to Microscopic (pts 3 + yrs)   . GFR   . Hepatic function panel (LFT)   . Lipase   . Basic Metabolic Panel   . GFR   . Diet renal 80 GM Protein   . ED Holding Orders Expire in 8 Hours   . Notify Admitting Attending ( Change in Condition)   . Notify Attending of Patient Arrival to Floor within 8 Hours   . Notify Physician (Vital Signs)   . Notify Physician (Lab Results)    . Vital Signs Q4HR   . Saline lock IV   . Admit to Inpatient   . E[facility] G[facility] ED Bed Request (Inpatient)       ASSESSMENT & PLAN     Heater Osuch is a 77 y.o. female admitted under INPATIENT with acute cholecystitis and worsening renal function.  I expect an inpatient to remain in the hospital for more than 2 midnights due to likely surgical intervention.  I started evaluating the patient at 6:30pm on November 09, 2017.    1. Gallstones with Possible Acute Choleycystitis  -Zosyn- renally dosed  -MRCP ordered without contrast secondary to renal function  -Dr. Darnelle Bos consulted and will see the patient in the AM. Requested NPO after midnight.   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 Electrolyte Abnormality  -secondary to poor po intake and vomiting  -IVF-NS at 125cc/hr  -Potassium repletion and recheck BMP-may need to move to Tele if no improvement    4. AKI on CKD Stage 3  -Urine Cx pending  -Hydrate-recheck Creat improved 3.1-->2.7  -Avoid  nephrotoxins  -Consulted Dr. Stacy Gardner who will see the patient in the AM, appreciate recommendations    5. HTN  -Continue home regimen    6. Hypothyroidism  -Continue home dose of synthroid    7. HLD  -Continue statin    8. NIDDM  -Pt not currently taking medications as she was having difficulty with hypoglycemia  -Will check BS with meals.    9. Nutrition  Renal diet    Safety Checklist  DVT prophylaxis:  CHEST guideline (See page [id number]) Mechanical   Foley: Not present   IVs:  Peripheral IV   PT/OT: Not needed   Daily CBC & or Chem ordered:  SHM/ABIM guidelines (see #5) Yes, due to clinical and lab instability     Anticipated medical stability for discharge: July, 24 - Morning    Service status/Reason for ongoing hospitalization: Inpatient:  risk of morbidity and mortality  Anticipated Discharge Needs: None    George Ina  11/09/2017 6:53 PM

## 2017-11-09 NOTE — Plan of Care (Signed)
Problem: Safety  Goal: Patient will be free from injury during hospitalization  Outcome: Progressing   11/09/17 1749   Goal/Interventions addressed this shift   Patient will be free from injury during hospitalization  Assess patient's risk for falls and implement fall prevention plan of care per policy;Provide and maintain safe environment;Use appropriate transfer methods;Ensure appropriate safety devices are available at the bedside;Assess for patients risk for elopement and implement Elopement Risk Plan per policy;Include patient/ family/ care giver in decisions related to safety;Hourly rounding   Pt arrived from ED via stretcher with all belongings accompanied by family members. Pt oriented to room/unit. Fall prevention safety plan and progressive mobility protocol reviewed, pt verbalized understanding. Pt currently resting in bed, family is at the bedside, bed is in lowest position, wheels are locked, side rails are up 2/4. Call light is within reach.  All safety measures maintained.

## 2017-11-10 ENCOUNTER — Inpatient Hospital Stay: Payer: Medicare Other

## 2017-11-10 ENCOUNTER — Telehealth: Payer: Self-pay | Admitting: Hematology

## 2017-11-10 LAB — MAGNESIUM
Magnesium: 1 mg/dL — ABNORMAL LOW (ref 1.6–2.6)
Magnesium: 2.5 mg/dL (ref 1.6–2.6)

## 2017-11-10 LAB — GLUCOSE WHOLE BLOOD - POCT
Whole Blood Glucose POCT: 150 mg/dL — ABNORMAL HIGH (ref 70–100)
Whole Blood Glucose POCT: 134 mg/dL — ABNORMAL HIGH (ref 70–100)
Whole Blood Glucose POCT: 243 mg/dL — ABNORMAL HIGH (ref 70–100)
Whole Blood Glucose POCT: 118 mg/dL — ABNORMAL HIGH (ref 70–100)

## 2017-11-10 LAB — COMPREHENSIVE METABOLIC PANEL
ALT: 9 U/L (ref 0–55)
AST (SGOT): 21 U/L (ref 5–34)
Albumin/Globulin Ratio: 1.1 (ref 0.9–2.2)
Albumin: 3.1 g/dL — ABNORMAL LOW (ref 3.5–5.0)
Alkaline Phosphatase: 74 U/L (ref 37–106)
Anion Gap: 15 (ref 5.0–15.0)
BUN: 46 mg/dL — ABNORMAL HIGH (ref 7–19)
Bilirubin, Total: 0.4 mg/dL (ref 0.2–1.2)
CO2: 17 mEq/L — ABNORMAL LOW (ref 22–29)
Calcium: 6.9 mg/dL — ABNORMAL LOW (ref 7.9–10.2)
Chloride: 99 mEq/L — ABNORMAL LOW (ref 100–111)
Creatinine: 2.5 mg/dL — ABNORMAL HIGH (ref 0.6–1.0)
Globulin: 2.8 g/dL (ref 2.0–3.6)
Glucose: 162 mg/dL — ABNORMAL HIGH (ref 70–100)
Potassium: 3.5 mEq/L (ref 3.5–5.1)
Protein, Total: 5.9 g/dL — ABNORMAL LOW (ref 6.0–8.3)
Sodium: 131 mEq/L — ABNORMAL LOW (ref 136–145)

## 2017-11-10 LAB — CBC AND DIFFERENTIAL
Absolute NRBC: 0 10*3/uL (ref 0.00–0.00)
Basophils Absolute Automated: 0.04 10*3/uL (ref 0.00–0.08)
Basophils Automated: 0.4 %
Eosinophils Absolute Automated: 0.01 10*3/uL (ref 0.00–0.44)
Eosinophils Automated: 0.1 %
Hematocrit: 33 % — ABNORMAL LOW (ref 34.7–43.7)
Hgb: 11 g/dL — ABNORMAL LOW (ref 11.4–14.8)
Immature Granulocytes Absolute: 0.11 10*3/uL — ABNORMAL HIGH (ref 0.00–0.07)
Immature Granulocytes: 1.1 %
Lymphocytes Absolute Automated: 0.94 10*3/uL (ref 0.42–3.22)
Lymphocytes Automated: 9.3 %
MCH: 33.2 pg (ref 25.1–33.5)
MCHC: 33.3 g/dL (ref 31.5–35.8)
MCV: 99.7 fL — ABNORMAL HIGH (ref 78.0–96.0)
MPV: 9.7 fL (ref 8.9–12.5)
Monocytes Absolute Automated: 0.42 10*3/uL (ref 0.21–0.85)
Monocytes: 4.2 %
Neutrophils Absolute: 8.58 10*3/uL — ABNORMAL HIGH (ref 1.10–6.33)
Neutrophils: 84.9 %
Nucleated RBC: 0 /100 WBC (ref 0.0–0.0)
Platelets: 346 10*3/uL (ref 142–346)
RBC: 3.31 10*6/uL — ABNORMAL LOW (ref 3.90–5.10)
RDW: 15 % (ref 11–15)
WBC: 10.1 10*3/uL — ABNORMAL HIGH (ref 3.10–9.50)

## 2017-11-10 LAB — GFR: EGFR: 18.7

## 2017-11-10 LAB — POTASSIUM: Potassium: 3.6 mEq/L (ref 3.5–5.1)

## 2017-11-10 MED ORDER — MAGNESIUM SULFATE IN D5W 1-5 GM/100ML-% IV SOLN
1.00 g | INTRAVENOUS | Status: AC
Start: 2017-11-10 — End: 2017-11-10
  Administered 2017-11-10 (×3): 1 g via INTRAVENOUS
  Filled 2017-11-10 (×4): qty 100

## 2017-11-10 MED ORDER — MAGNESIUM SULFATE IN D5W 1-5 GM/100ML-% IV SOLN
1.0000 g | INTRAVENOUS | Status: AC
Start: 2017-11-10 — End: 2017-11-10
  Administered 2017-11-10: 1 g via INTRAVENOUS
  Filled 2017-11-10: qty 200

## 2017-11-10 MED ORDER — HYDRALAZINE HCL 20 MG/ML IJ SOLN
10.00 mg | Freq: Four times a day (QID) | INTRAMUSCULAR | Status: DC | PRN
Start: 2017-11-10 — End: 2017-11-13

## 2017-11-10 NOTE — Consults (Signed)
GASTROENTEROLOGY CONSULTATION  [name]  [phone]    [phone]    Date Time: 11/10/17 8:13 AM  Patient Name: [name] [name]  Requesting Physician: Krystal Eaton, MD      Reason for Consultation:   Dilated CBD    Assessment:   77 y/o female  H/o DM, HLD, HTN, hypothyroidism  H/o renal cell carcinoma with pulmonary, bone, and adrenal mets-s/p Left nephrectomy and left adrenal gland removal, on chemo since 08/2016 (last dose 11/08/17)      -Cholecystitis  -CBD dilated w/o evidence common duct stones confirmed on [name] and MRCP. LFTs normal.   -AKI    Plan:   1. ERCP is not warranted currently. Serial labs  2. Management of gallbladder per surgery: perc chole vs. Lap chole with IOC.   3. Continue abx    History:   [name] [name] is a 77 y.o. female who [facility] to the [name] on 11/09/2017 with one week history of decreased appetite, recurrent RUQ pain and 24-48 hours of vomiting. 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 Defecates ~ 6-7x/day.     Today, continues to have RUQ.     Endoscopic History   EGD:    Colonoscopy:    ERCP:    Past Medical History:     Past Medical History:   Diagnosis Date   . Diabetes mellitus    . Disorder of thyroid    . Gallstone    . Hyperlipidemia    . Hypertension    . Renal cancer             Past Surgical History:     Past Surgical History:   Procedure Laterality Date   . NEPHRECTOMY         Family History:   History reviewed. No pertinent family history.    Social History:     Social History     Social History   . Marital status: Widowed     Spouse name: N/A   . Number of children: N/A   . Years of education: N/A     Social History Main Topics   . Smoking status: Former Smoker     Quit date: 11/10/1967   . Smokeless tobacco: Never Used   . Alcohol use Not on file   . Drug use: Unknown   . Sexual activity: Not on file     Other Topics Concern   . Not on file     Social History Narrative   . No narrative on file       Allergies:    No Known Allergies    Medications:     Current Facility-Administered Medications   Medication Dose Route Frequency   . amLODIPine  5 mg Oral Daily   . atorvastatin  40 mg Oral QPM   . carvedilol  6.25 mg Oral Q12H SCH   . hydrocortisone  10 mg Oral BID   . levothyroxine  50 mcg Oral Daily at 0600   . magnesium sulfate  1 g Intravenous Q1H   . pantoprazole  40 mg Oral QHS   . piperacillin-tazobactam  2.25 g Intravenous Q8H       Review of Systems:   A comprehensive review of systems was:   History obtained from the patient  Gastrointestinal ROS:  positive for - abdominal pain and nausea/vomiting  negative for - stool incontinence or swallowing difficulty/pain    A comprehensive review of systems was: History obtained from the patient  The patient denies any additional changes to their otic, opthalmologic, dermatologic, pulmonary, cardiac, genitourinary, musculoskeletal, hematologic, constitutional, or psychiatric systems.      Physical Exam:     Vitals:    11/10/17 0534   BP: 155/74   Pulse: 75   Resp: 19   Temp: 97.7 F (36.5 C)   SpO2: 98%       Intake and Output Summary (Last 24 hours) at Date Time    Intake/Output Summary (Last 24 hours) at 11/10/17 0813  Last data filed at 11/10/17 0600   Gross per 24 hour   Intake             2637 ml   Output                0 ml   Net             2637 ml       General appearance - alert, well appearing, and in no distress  Mental status - alert, oriented to person, place, and time  Eyes - pupils equal and reactive, extraocular eye movements intact  Abdomen - soft, tender RUQ, nondistended      Lab:     Results     Procedure Component Value Units Date/Time    Comprehensive metabolic panel [[id number]]  (Abnormal) Collected:  11/10/17 0630    Specimen:  Blood Updated:  11/10/17 0752     Glucose 162 (H) mg/dL      BUN 46 (H) mg/dL      Creatinine 2.5 (H) mg/dL      Sodium 409 (L) mEq/L      Potassium 3.5 mEq/L      Chloride 99 (L) mEq/L      CO2 17 (L) mEq/L      Calcium 6.9 (L)  mg/dL      Protein, Total 5.9 (L) g/dL      Albumin 3.1 (L) g/dL      AST (SGOT) 21 U/L      ALT 9 U/L      Alkaline Phosphatase 74 U/L      Bilirubin, Total 0.4 mg/dL      Globulin 2.8 g/dL      Albumin/Globulin Ratio 1.1     Anion Gap 15.0    GFR [[id number]] Collected:  11/10/17 0630     Updated:  11/10/17 0752     EGFR 18.7    Magnesium [[id number]] Collected:  11/10/17 0630     Updated:  11/10/17 0752     Magnesium 2.5 mg/dL     CBC with differential [[id number]]  (Abnormal) Collected:  11/10/17 0630    Specimen:  Blood from Blood Updated:  11/10/17 0739     WBC 10.10 (H) x10 3/uL      Hgb 11.0 (L) g/dL      Hematocrit 52.8 (L) %      Platelets 346 x10 3/uL      RBC 3.31 (L) x10 6/uL      MCV 99.7 (H) fL      MCH 33.2 pg      MCHC 33.3 g/dL      RDW 15 %      MPV 9.7 fL      Neutrophils 84.9 %      Lymphocytes Automated 9.3 %      Monocytes 4.2 %      Eosinophils Automated 0.1 %      Basophils Automated 0.4 %      Immature Granulocyte  1.1 %      Nucleated RBC 0.0 /100 WBC      Neutrophils Absolute 8.58 (H) x10 3/uL      Abs Lymph Automated 0.94 x10 3/uL      Abs Mono Automated 0.42 x10 3/uL      Abs Eos Automated 0.01 x10 3/uL      Absolute Baso Automated 0.04 x10 3/uL      Absolute Immature Granulocyte 0.11 (H) x10 3/uL      Absolute NRBC 0.00 x10 3/uL     Glucose Whole Blood - POCT [[id number]]  (Abnormal) Collected:  11/10/17 0716     Updated:  11/10/17 0737     POCT - Glucose Whole blood 150 (H) mg/dL     Potassium [[id number]] Collected:  11/10/17 0039    Specimen:  Blood Updated:  11/10/17 0118     Potassium 3.6 mEq/L     Magnesium [[id number]]  (Abnormal) Collected:  11/10/17 0039     Updated:  11/10/17 0118     Magnesium 1.0 (L) mg/dL     Adult Urine culture [[id number]] Collected:  11/09/17 1605    Specimen:  Urine from Urine, Clean Catch Updated:  11/10/17 0053    Narrative:       Replace urinary catheter prior to obtaining the urine culture  if it has been in place for greater than or equal to  14  days:->N/A No Foley  Indications for Urine Culture:->Other (please specify in  Comments)    Glucose Whole Blood - POCT [[id number]]  (Abnormal) Collected:  11/09/17 2206     Updated:  11/09/17 2209     POCT - Glucose Whole blood 152 (H) mg/dL     Basic Metabolic Panel [[id number]]  (Abnormal) Collected:  11/09/17 1958    Specimen:  Blood Updated:  11/09/17 2031     Glucose 128 (H) mg/dL      BUN 49 (H) mg/dL      Creatinine 2.6 (H) mg/dL      Calcium 6.8 (L) mg/dL      Sodium 462 (L) mEq/L      Potassium 2.4 (LL) mEq/L      Chloride 97 (L) mEq/L      CO2 18 (L) mEq/L      Anion Gap 17.0 (H)    GFR [[id number]] Collected:  11/09/17 1958     Updated:  11/09/17 2031     EGFR 17.9    UA, Reflex to Microscopic (pts 3 + yrs) [[id number]]  (Abnormal) Collected:  11/09/17 1605    Specimen:  Urine Updated:  11/09/17 1634     Urine Type Clean Catch     Color, UA Amber (A)     Clarity, UA Hazy     Specific Gravity UA 1.019     Urine pH 5.0     Leukocyte Esterase, UA Large (A)     Nitrite, UA Negative     Protein, UR 30 (A)     Glucose, UA Negative     Ketones UA Negative     Urobilinogen, UA Normal mg/dL      Bilirubin, UA Negative     Blood, UA Negative     RBC, UA 6 - 10 (A) /hpf      WBC, UA 11 - 25 (A) /hpf      Squamous Epithelial Cells, Urine 11 - 25 /hpf      Hyaline Casts, UA 0 - 3 /lpf     CBC and differential [[id number]]  (  Abnormal) Collected:  11/09/17 1223    Specimen:  Blood from Blood Updated:  11/09/17 1307     WBC 11.09 (H) x10 3/uL      Hgb 11.9 g/dL      Hematocrit 16.1 %      Platelets 426 (H) x10 3/uL      RBC 3.55 (L) x10 6/uL      MCV 98.9 (H) fL      MCH 33.5 pg      MCHC 33.9 g/dL      RDW 15 %      MPV 9.5 fL      Neutrophils 80.4 %      Lymphocytes Automated 11.8 %      Monocytes 6.7 %      Eosinophils Automated 0.2 %      Basophils Automated 0.3 %      Immature Granulocyte 0.6 %      Nucleated RBC 0.0 /100 WBC      Neutrophils Absolute 8.92 (H) x10 3/uL      Abs Lymph Automated 1.31 x10 3/uL      Abs  Mono Automated 0.74 x10 3/uL      Abs Eos Automated 0.02 x10 3/uL      Absolute Baso Automated 0.03 x10 3/uL      Absolute Immature Granulocyte 0.07 x10 3/uL      Absolute NRBC 0.00 x10 3/uL     Lipase [[id number]]  (Abnormal) Collected:  11/09/17 1223     Updated:  11/09/17 1251     Lipase 137 (H) U/L     Basic Metabolic Panel [[id number]]  (Abnormal) Collected:  11/09/17 1223    Specimen:  Blood Updated:  11/09/17 1251     Glucose 143 (H) mg/dL      BUN 52 (H) mg/dL      Creatinine 3.1 (H) mg/dL      Calcium 7.5 (L) mg/dL      Sodium 956 (L) mEq/L      Potassium 2.9 (L) mEq/L      Chloride 92 (L) mEq/L      CO2 21 (L) mEq/L      Anion Gap 19.0 (H)    GFR [[id number]] Collected:  11/09/17 1223     Updated:  11/09/17 1251     EGFR 14.6    Hepatic function panel (LFT) [[id number]] Collected:  11/09/17 1223     Updated:  11/09/17 1251     Bilirubin, Total 0.4 mg/dL      Bilirubin, Direct 0.2 mg/dL      Bilirubin, Indirect 0.2 mg/dL      AST (SGOT) 22 U/L      ALT 17 U/L      Alkaline Phosphatase 90 U/L      Protein, Total 7.2 g/dL      Albumin 3.8 g/dL      Globulin 3.4 g/dL      Albumin/Globulin Ratio 1.1            Radiology:   Radiological Procedure reviewed.     Signed by: Donnetta Hutching

## 2017-11-10 NOTE — Plan of Care (Signed)
Problem: Altered GI Function  Goal: Nutritional intake is adequate  Outcome: Progressing  Pt advanced to clear liquid diet. Tolerating well; no nausea, no emesis. Pt continues to have diarrhea, MD aware. IV infusing.

## 2017-11-10 NOTE — UM Notes (Signed)
** This review is compiled from documentation provided by the Treatment Team within the patient's medical record. **     Darlina Rumpf RN, BSN, CCM  Clinical Case Manager - Utilization Review  [facility]  [address]  [place], [place] [address]    NPI: [id number]  Tax ID: [id number]    Phone: [phone] Option 1  FAX: [phone]    Please use fax number [phone] to provide authorization for hospital services or to request additional information.        PATIENT NAME: [name],[name]   DOB: July 27, 1940      --- 11/09/2017 --- ED - The patient [name] [name], is a 77 y.o. female who presents with chief complaint of Vomiting.  Patient has history of active renal cancer, is on chemotherapy presents with one-week history of decreased appetite, recurrent right upper quadrant abdominal pain and 24 hours of nausea and vomiting, describing nonbloody, nonbilious vomiting. Patient denies fevers or chills.  Patient had taken chemotherapy yesterday and since then she has had nausea associated with vomiting.  Patient states she has known gallstones and states she suspects pain in her right upper part of her abdomen is related to her gallbladder.  Patient states she has also had decreased appetite since she has been on chemotherapy.  Patient denies other associated complaints.     ED Triage Vitals   Enc Vitals Group      BP 11/09/17 1205 138/90      Heart Rate 11/09/17 1205 82      Resp Rate 11/09/17 1202 18      Temp 11/09/17 1205 98.2 F (36.8 C)      Temp Source 11/09/17 1205 Oral      SpO2 11/09/17 1205 100 %      Weight 11/09/17 1750 62 kg (136 lb 9.6 oz)      Height 11/09/17 1750 1.499 m (4\' 11" )     UA, Reflex to Microscopic (pts 3 + yrs) [[id number]]  (Abnormal) Collected:  11/09/17 1605     Specimen:  Urine Updated:  11/09/17 1634     Urine Type Clean Catch     Color, UA Amber (A)     Clarity, UA Hazy     Specific Gravity UA 1.019     Urine pH 5.0     Leukocyte Esterase, UA Large (A)      Nitrite, UA Negative     Protein, UR 30 (A)     Glucose, UA Negative     Ketones UA Negative     Urobilinogen, UA Normal mg/dL      Bilirubin, UA Negative     Blood, UA Negative     RBC, UA 6 - 10 (A) /hpf      WBC, UA 11 - 25 (A) /hpf      Squamous Epithelial Cells, Urine 11 - 25 /hpf      Hyaline Casts, UA 0 - 3 /lpf      WBC 11.09 (H) x10 3/uL        Hgb 11.9 g/dL      Hematocrit 27.0 %      Platelets 426 (H) x10 3/uL      RBC 3.55 (L) x10 6/uL      Lipase 137 (H)     Glucose 143 (H) mg/dL        BUN 52 (H) mg/dL      Creatinine 3.1 (H) mg/dL      Calcium 7.5 (L)  mg/dL      Sodium 604 (L) mEq/L      Potassium 2.9 (L) mEq/L      Chloride 92 (L) mEq/L      CO2 21 (L) mEq/L      Anion Gap 19.0 (H)     EGFR 14.6     ED Medication Orders     Start        11/09/17 1707  ondansetron (ZOFRAN) injection 4 mg  Once     Route: Intravenous  Ordered Dose: 4 mg        11/09/17 1227  sodium chloride 0.9 % bolus 500 mL  Once     Route: Intravenous  Ordered Dose: 500 mL        11/09/17 1227  ondansetron (ZOFRAN) injection 4 mg  Once     Route: Intravenous  Ordered Dose: 4 mg        11/09/17 1227  morphine injection 2 mg  Once     Route: Intravenous  Ordered Dose: 2 mg        11/09/17 1214  sodium chloride 0.9 % bolus 500 mL  Once     Route: Intravenous  Ordered Dose: 500 mL         Abd [name]:  1. Gallstones and sludge in the gallbladder, with minimal wall  thickening and minimal pericholecystic fluid raising the possibility of  mild cholecystitis.  2. Moderate biliary dilatation to the level of the distal common duct.  The cause of dilatation is not identified on this study, and could be  further evaluated by MRCP.  3. 1.1 cm hypoechoic lesion in the body of the pancreas which is       indeterminate. Recommend further evaluation with contrast-enhanced MRI.    Clinical Impression  1. Dehydration    2. Acute renal failure, unspecified acute renal failure type     3. Hypokalemia    4. Cholecystitis      -------------Admitted inpatient 11/09/17-------------------    [name] [name] is a 77 y.o. female admitted under INPATIENT with acute cholecystitis and worsening renal function.  I expect an inpatient to remain in the hospital for more than 2 midnights due to likely surgical intervention.  I started evaluating the patient at 6:30pm on November 09, 2017.    1. Gallstones with Possible Acute Choleycystitis  -Zosyn- renally dosed  -MRCP ordered without contrast secondary to renal function  -Dr. Darnelle Bos consulted and will see the patient in the AM. Requested NPO after midnight.   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 Electrolyte Abnormality  -secondary to poor po intake and vomiting  -IVF-NS at 125cc/hr  -Potassium repletion and recheck BMP-may need to move to Tele if no improvement    4. AKI on CKD Stage 3  -Urine Cx pending  -Hydrate-recheck Creat improved 3.1-->2.7  -Avoid nephrotoxins    5. HTN  -Continue home regimen    6. Hypothyroidism  -Continue home dose of synthroid    7. HLD  -Continue statin    8. NIDDM  -Pt not currently taking medications as she was having difficulty with hypoglycemia  -Will check BS with meals.    9. Nutrition  Renal diet  Pt. receiving replacement Potasium, Magnesium and fluids lost from nausea, vomiting and diarrhea. Continues with loose stools.    -------------11/10/17---------------  GI -   Plan: 1.   ERCP is not warranted currently. Serial labs  2. Management of gallbladder per surgery: perc chole vs. Lap chole with IOC.   3. Continue abx    Current Facility-Administered Medications   Medication Dose Route Frequency   . amLODIPine  5 mg Oral Daily   . atorvastatin  40 mg Oral QPM   . carvedilol   6.25 mg Oral Q12H SCH   . hydrocortisone  10 mg Oral BID   . levothyroxine  50 mcg Oral Daily at 0600   . magnesium sulfate  1 g Intravenous Q1H   . pantoprazole  40 mg Oral QHS   . piperacillin-tazobactam  2.25 g Intravenous Q8H     Vitals:    11/10/17 0534   BP: 155/74   Pulse: 75   Resp: 19   Temp: 97.7 F (36.5 C)   SpO2: 98%        Glucose 162 (H) mg/dL        BUN 46 (H) mg/dL      Creatinine 2.5 (H) mg/dL      Sodium 272 (L) mEq/L      Potassium 3.5 mEq/L      Chloride 99 (L) mEq/L      CO2 17 (L) mEq/L      Calcium 6.9 (L) mg/dL      Protein, Total 5.9 (L) g/dL      Albumin 3.1 (L) g/dL      WBC 53.66 (H) Y40 3/uL        Hgb 11.0 (L) g/dL      Hematocrit 34.7 (L) %      Platelets 346 x10 3/uL      RBC 3.31 (L) x10 6/uL      Continue with:  Mag riders q 1 hr till repletd.   IV Protonix daily  IV Zosyn q 8 hrs   IVF @ 125 cc/hr  IV Zofran q 6 hrs prn

## 2017-11-10 NOTE — Progress Notes (Signed)
CM met with [name] [name] and her husband. Explained role and provided contact info.  Patient is independent in her care needs and ambulation. Resides with husband. Has been visiting daughter in [place].   Reviewed POC and DCP.  Surgery not doing  Lap chole due to recent chemo. Plan for ivabx and possible drain.   Husband shared if [facility] is needed, [facility] is primary [facility] agency in [place]. PCP/Cards is [name] [name] in [place].  CM will follow for DCP.     11/10/17 1221   Patient Type   Within 30 Days of Previous Admission? No   Healthcare Decisions   Interviewed: Patient;Spouse   Orientation/Decision Estate manager/land agent of Patient Alert and Oriented x3, able to make decisions   Prior to admission   Prior level of function Independent with ADLs   Type of Residence Private residence   Living Arrangements Spouse/significant other   Dressing Independent   Grooming Independent   Feeding Independent   Hospital doctor Independent   Discharge Planning   Support Systems Spouse/significant other;Family members   Patient expects to be discharged to: Home   Anticipated [place] plan discussed with: Same as interviewed   Mode of transportation: Private car (family member)   Consults/Providers   Correct PCP listed in Epic? [name] November, MD in [place])   Important Message from Medicare Notice   Patient received 1st IMM Letter? Yes

## 2017-11-10 NOTE — Consults (Signed)
Consultation    Date Time: 11/10/17 8:06 PM  Patient Name: [name]  Requesting Physician: Krystal Eaton, MD       Reason for Consultation:   Consulted by Dr. Jacques Navy for AKI management    PMh/Sh/Fh reviewed from the chart        History:   [name] [name] is a 76 y.o. female who presents to the hospital on 11/09/2017 with vomiting, decreased appetite for the past 5-6 days. Patient has been feeling poorly for the past few days. She has been having diarrhea, decreased PO intake and fluid intake. She states she has been having right abdominal pain. No complaints of fevers, chills.     Renal history: s/p Nephrectomy in 2018 for renal cell carcinoma. She has been on  Xgeva, Sutent for her metastatic disease. Creatinine in July 2019 was 1.7 and in January 2019 was 1.5.     Past Medical History:     Past Medical History:   Diagnosis Date   . Diabetes mellitus    . Disorder of thyroid    . Gallstone    . Hyperlipidemia    . Hypertension    . Renal cancer        Past Surgical History:     Past Surgical History:   Procedure Laterality Date   . NEPHRECTOMY         Family History:   No history of kidney disease    Social History:     Social History     Social History   . Marital status: Married     Spouse name: N/A   . Number of children: N/A   . Years of education: N/A     Social History Main Topics   . Smoking status: Former Smoker     Quit date: 11/10/1967   . Smokeless tobacco: Never Used   . Alcohol use Not on file   . Drug use: Unknown   . Sexual activity: Not on file     Other Topics Concern   . Not on file     Social History Narrative   . No narrative on file       Allergies:   No Known Allergies    Medications:     Current Facility-Administered Medications   Medication Dose Route Frequency   . amLODIPine  5 mg Oral Daily   . atorvastatin  40 mg Oral QPM   . carvedilol  6.25 mg Oral Q12H SCH   . hydrocortisone  10 mg Oral BID   . levothyroxine  50 mcg Oral Daily at 0600   . pantoprazole  40 mg Oral QHS   .  piperacillin-tazobactam  2.25 g Intravenous Q8H         . sodium chloride 75 mL/hr at 11/10/17 1648       Prior to Admission medications    Medication Sig Start Date End Date Taking? Authorizing Provider   amLODIPine (NORVASC) 5 MG tablet Take 5 mg by mouth daily   Yes [provider]   atorvastatin (LIPITOR) 40 MG tablet Take 40 mg by mouth every evening       Yes [provider]   Calcium 600-200 MG-UNIT per tablet Take 1 tablet by mouth daily   Yes [provider]   carvedilol (COREG) 6.25 MG tablet Take 6.25 mg by mouth 2 (two) times daily with meals       Yes [provider]   Denosumab (XGEVA SC) Inject into the skin Every  six weeks      Yes [provider]   hydrocortisone (CORTEF) 5 MG tablet Take 10 mg by mouth 2 (two) times daily       Yes [provider]   levothyroxine (SYNTHROID, LEVOTHROID) 50 MCG tablet Take 50 mcg by mouth Once a day at 6:00am   Yes [provider]   pantoprazole (PROTONIX) 20 MG tablet Take 20 mg by mouth every evening       Yes [provider]   prochlorperazine (COMPAZINE) 10 MG tablet Take 10 mg by mouth every 6 (six) hours as needed   Yes [provider]   SUNItinib (SUTENT) 50 MG capsule Take 37.5 mg by mouth daily For 2 weeks on and 1 week off.      Yes [provider]   traMADol (ULTRAM) 50 MG tablet Take 50 mg by mouth every 6 (six) hours as needed for Pain   Yes [provider]        Review of Systems:   A comprehensive review of systems was: General ROS: positive for  - fatigue  ENT ROS: negative for - sore throat  Respiratory ROS: no cough, shortness of breath, or wheezing  Cardiovascular ROS: no chest pain or dyspnea on exertion  Genito-Urinary ROS: no dysuria, trouble voiding, or hematuria  Neurological ROS: negative for - dizziness or headaches  Dermatological ROS: negative for pruritus and rash  Rest of review of systems are negative    Physical Exam:   Patient Vitals  for the past 24 hrs:   BP Temp Temp src Pulse Resp SpO2   11/10/17 1208 168/83 97.7 F (36.5 C) Oral 66 18 99 %   11/10/17 1154 149/80 - - 62 - -   11/10/17 0534 155/74 97.7 F (36.5 C) Oral 75 19 98 %   11/09/17 2019 144/78 98.2 F (36.8 C) Oral 70 18 97 %       Intake and Output Summary (Last 24 hours) at Date Time    Intake/Output Summary (Last 24 hours) at 11/10/17 2006  Last data filed at 11/10/17 1500   Gross per 24 hour   Intake             1257 ml   Output                0 ml   Net             1257 ml       I/O last 3 completed shifts:  In: 2757 [P.O.:120; I.V.:1137; IV Piggyback:1500]  Out: -     General appearance - alert, well appearing, and in no distress  Mental status - alert, oriented to person, place, and time  Mouth - dry mucus membranes  Neck - supple, no significant adenopathy  Chest - clear to auscultation, no wheezes, rales or rhonchi, symmetric air entry  Heart - S1 and S2 normal  Abdomen - soft  Neurological - alert, normal speech  Extremities - no edema, no clubbing or cyanosis  Skin - no rash    Labs Reviewed:     Recent Labs  Lab 11/10/17  0630 11/09/17  1223   WBC 10.10* 11.09*   RBC 3.31* 3.55*   Hgb 11.0* 11.9   Hematocrit 33.0* 35.1   MCV 99.7* 98.9*   MCHC 33.3 33.9   RDW 15 15   MPV 9.7 9.5   Platelets 346 426*         Recent Labs  Lab 11/10/17  0630 11/10/17  0039 11/09/17  1958 11/09/17  1223   Sodium 131*  --  132* 132*   Potassium 3.5 3.6 2.4* 2.9*   Chloride 99*  --  97* 92*   CO2 17*  --  18* 21*   BUN 46*  --  49* 52*   Creatinine 2.5*  --  2.6* 3.1*   Glucose 162*  --  128* 143*   Calcium 6.9*  --  6.8* 7.5*   Magnesium 2.5 1.0*  --   --                      Recent Labs  Lab 11/10/17  0630 11/09/17  1223   ALT 9 17   AST (SGOT) 21 22   Bilirubin, Total 0.4 0.4   Bilirubin, Direct  --  0.2   Albumin 3.1* 3.8   Alkaline Phosphatase 74 90                           Rads:   Radiological Procedure reviewed.     Signed by: Henrine Screws, MD      Assessment/ Plan:   1) Renal: AKI  on CKD, possible pre-renal or ATN. Improving after IV Fluids. I would continue IV fluids at the current rate. Avoid nephrotoxins Dose medications for GFR less than 20 ml/min.     2) Hypertension: Continue coreg, norvasc.     3) Hyponatremia: Hypovolemic hyponatremia. Continue NS at the current     4) Acidosis: AG acidosis. No intervention at this time      Henrine Screws, MD  [facility]  Spectra# [phone]  Pg# [phone]  Office # [phone]  After 430 PM, please call the office for person on call  On Weekends, call the office for the person on call          Thank you very much for your consult. Will follow patient closely. Please feel free to contact with any question.       CC:  Pcp, [facility], MD

## 2017-11-10 NOTE — Consults (Signed)
CONSULTATION REPORT  [facility] [phone]      Consultation Date Time: 11/10/17 3:13 PM  Date of Admission: 11/09/2017     Requesting Physician: Krystal Eaton, MD  Consulting Physician: Dossie Der, MD  Consulting Service: General Surgery    Reason For Consultation:  Abdominal Pain      Impression:     Patient Active Problem List   Diagnosis   . 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 On chemo since May 2018, last dose 11/08/2016.  - PMH: HTN, HLD, Thyroid disorder, DM    - mild acute cholecystitis  - CBD dilation w/o evidence of choledocholithiasis on [name] or MRCP  - US demonstrating 1.1cm hypoechoic mass, MRCP wnl regarding pancreas  - AKI    Mildly elevated wbc. LFTs wnl. Possibly passed gallstone? Mildly elevated lipase.  Plan:   - recommend conservative management: IV abx. If fails Abx recommend percutaneous cholecystostomy tube placement.  - Would recommend avoiding surgical intervention at this time if possible given actively on chemotherapy and would healing would be an issue along with surgical risk given comorbid conditions at this time  - ok for clear liquid diet  - Images personally reviewed    *discussed recommendations with nursing and Dr. Jacques Navy      History of Present Illness:   [name] [name] is a 77 y.o. female who  has a past medical history of Diabetes mellitus; Disorder of thyroid; Gallstone; Hyperlipidemia; Hypertension; and Renal cancer.. She presents to the hospital complaining of RUQ abdominal pain x2 days which does not radiate. Associated symptoms: nausea, decreased appetite, vomiting. 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 RUQ pain is mildly better today, but still present.    Past Medical History:     Past Medical History:   Diagnosis Date   . Diabetes mellitus    . Disorder of thyroid    . Gallstone    . Hyperlipidemia    . Hypertension    . Renal cancer        Past Surgical History:     Past  Surgical History:   Procedure Laterality Date   . NEPHRECTOMY         Family History:   History reviewed. No pertinent family history.    Social History:     Social History     Social History   . Marital status: Married     Spouse name: N/A   . Number of children: N/A   . Years of education: N/A     Social History Main Topics   . Smoking status: Former Smoker     Quit date: 11/10/1967   . Smokeless tobacco: Never Used   . Alcohol use Not on file   . Drug use: Unknown   . Sexual activity: Not on file     Other Topics Concern   . Not on file     Social History Narrative   . No narrative on file       Allergies:   No Known Allergies    Medications:     Prior to Admission medications    Medication Sig Start Date End Date Taking? Authorizing Provider   amLODIPine (NORVASC) 5 MG tablet Take 5 mg by mouth daily   Yes [provider]   atorvastatin (LIPITOR) 40 MG tablet Take 40 mg by mouth every evening       Yes [provider]  Calcium 600-200 MG-UNIT per tablet Take 1 tablet by mouth daily   Yes [provider]   carvedilol (COREG) 6.25 MG tablet Take 6.25 mg by mouth 2 (two) times daily with meals       Yes [provider]   Denosumab (XGEVA SC) Inject into the skin Every six weeks      Yes [provider]   hydrocortisone (CORTEF) 5 MG tablet Take 10 mg by mouth 2 (two) times daily       Yes [provider]   levothyroxine (SYNTHROID, LEVOTHROID) 50 MCG tablet Take 50 mcg by mouth Once a day at 6:00am   Yes [provider]   pantoprazole (PROTONIX) 20 MG tablet Take 20 mg by mouth every evening       Yes [provider]   prochlorperazine (COMPAZINE) 10 MG tablet Take 10 mg by mouth every 6 (six) hours as needed   Yes [provider]   SUNItinib (SUTENT) 50 MG capsule Take 37.5 mg by mouth daily For 2 weeks on and 1 week off.      Yes [provider]   traMADol (ULTRAM) 50 MG tablet Take 50 mg by mouth every 6 (six) hours as  needed for Pain   Yes [provider]       Review of Systems:   As per the HPI.  The patient denies any additional changes to their otic, opthalmologic, dermatologic, pulmonary, cardiac, gastrointestinal, genitourinary, musculoskeletal, hematologic, constitutional, or psychiatric systems.      Physical Exam:     Vitals:    11/10/17 1208   BP: 168/83   Pulse: 66   Resp: 18   Temp: 97.7 F (36.5 C)   SpO2: 99%       General appearance - alert, well appearing, and in no distress  Mental status - alert, oriented to person, place, and time  Eyes - sclera anicteric, left eye normal, right eye normal  Chest - no tachypnea, retractions or cyanosis, symmetric chest rise/fall breathing comfortably on room air, audibly clear respirations  Heart - normal rate and regular rhythm  Abdomen - soft, tender to palpation in RUQ, otherwise non-tender, non distended  Extremities - no pedal edema noted  Skin - warm, well perfused    Labs:     Results     Procedure Component Value Units Date/Time    Glucose Whole Blood - POCT [[id number]]  (Abnormal) Collected:  11/10/17 1146     Updated:  11/10/17 1154     POCT - Glucose Whole blood 134 (H) mg/dL     Comprehensive metabolic panel [[id number]]  (Abnormal) Collected:  11/10/17 0630    Specimen:  Blood Updated:  11/10/17 0752     Glucose 162 (H) mg/dL      BUN 46 (H) mg/dL      Creatinine 2.5 (H) mg/dL      Sodium 782 (L) mEq/L      Potassium 3.5 mEq/L      Chloride 99 (L) mEq/L      CO2 17 (L) mEq/L      Calcium 6.9 (L) mg/dL      Protein, Total 5.9 (L) g/dL      Albumin 3.1 (L) g/dL      AST (SGOT) 21 U/L      ALT 9 U/L      Alkaline Phosphatase 74 U/L      Bilirubin, Total 0.4 mg/dL      Globulin 2.8 g/dL  Albumin/Globulin Ratio 1.1     Anion Gap 15.0    GFR [[id number]] Collected:  11/10/17 0630     Updated:  11/10/17 0752     EGFR 18.7    Magnesium [[id number]] Collected:  11/10/17 0630     Updated:  11/10/17 0752     Magnesium 2.5 mg/dL     CBC with differential [[id number]]   (Abnormal) Collected:  11/10/17 0630    Specimen:  Blood from Blood Updated:  11/10/17 0739     WBC 10.10 (H) x10 3/uL      Hgb 11.0 (L) g/dL      Hematocrit 46.9 (L) %      Platelets 346 x10 3/uL      RBC 3.31 (L) x10 6/uL      MCV 99.7 (H) fL      MCH 33.2 pg      MCHC 33.3 g/dL      RDW 15 %      MPV 9.7 fL      Neutrophils 84.9 %      Lymphocytes Automated 9.3 %      Monocytes 4.2 %      Eosinophils Automated 0.1 %      Basophils Automated 0.4 %      Immature Granulocyte 1.1 %      Nucleated RBC 0.0 /100 WBC      Neutrophils Absolute 8.58 (H) x10 3/uL      Abs Lymph Automated 0.94 x10 3/uL      Abs Mono Automated 0.42 x10 3/uL      Abs Eos Automated 0.01 x10 3/uL      Absolute Baso Automated 0.04 x10 3/uL      Absolute Immature Granulocyte 0.11 (H) x10 3/uL      Absolute NRBC 0.00 x10 3/uL     Glucose Whole Blood - POCT [[id number]]  (Abnormal) Collected:  11/10/17 0716     Updated:  11/10/17 0737     POCT - Glucose Whole blood 150 (H) mg/dL     Potassium [[id number]] Collected:  11/10/17 0039    Specimen:  Blood Updated:  11/10/17 0118     Potassium 3.6 mEq/L     Magnesium [[id number]]  (Abnormal) Collected:  11/10/17 0039     Updated:  11/10/17 0118     Magnesium 1.0 (L) mg/dL     Adult Urine culture [[id number]] Collected:  11/09/17 1605    Specimen:  Urine from Urine, Clean Catch Updated:  11/10/17 0053    Narrative:       Replace urinary catheter prior to obtaining the urine culture  if it has been in place for greater than or equal to 14  days:->N/A No Foley  Indications for Urine Culture:->Other (please specify in  Comments)    Glucose Whole Blood - POCT [[id number]]  (Abnormal) Collected:  11/09/17 2206     Updated:  11/09/17 2209     POCT - Glucose Whole blood 152 (H) mg/dL     Basic Metabolic Panel [[id number]]  (Abnormal) Collected:  11/09/17 1958    Specimen:  Blood Updated:  11/09/17 2031     Glucose 128 (H) mg/dL      BUN 49 (H) mg/dL      Creatinine 2.6 (H) mg/dL      Calcium 6.8 (L) mg/dL      Sodium  301 (L) mEq/L      Potassium 2.4 (LL) mEq/L      Chloride 97 (L) mEq/L  CO2 18 (L) mEq/L      Anion Gap 17.0 (H)    GFR [[id number]] Collected:  11/09/17 1958     Updated:  11/09/17 2031     EGFR 17.9    UA, Reflex to Microscopic (pts 3 + yrs) [[id number]]  (Abnormal) Collected:  11/09/17 1605    Specimen:  Urine Updated:  11/09/17 1634     Urine Type Clean Catch     Color, UA Amber (A)     Clarity, UA Hazy     Specific Gravity UA 1.019     Urine pH 5.0     Leukocyte Esterase, UA Large (A)     Nitrite, UA Negative     Protein, UR 30 (A)     Glucose, UA Negative     Ketones UA Negative     Urobilinogen, UA Normal mg/dL      Bilirubin, UA Negative     Blood, UA Negative     RBC, UA 6 - 10 (A) /hpf      WBC, UA 11 - 25 (A) /hpf      Squamous Epithelial Cells, Urine 11 - 25 /hpf      Hyaline Casts, UA 0 - 3 /lpf           Rads:     Radiology Results (24 Hour)     Procedure Component Value Units Date/Time    MRI Abdomen WO Contrast MRCP [[id number]] Collected:  11/10/17 0859    Order Status:  Completed Updated:  11/10/17 0929    Narrative:       Clinical history: Gallstones, dilated common bile duct.    TECHNIQUE: Abdominal MRI/MRCP was performed without IV contrast per  request.    COMPARISON: Abdominal ultrasound dated 11/09/2017.    FINDINGS:    The gallbladder is mildly distended. There is cholelithiasis and sludge  in the gallbladder. There is mild gallbladder wall thickening. The  common bile duct is dilated measuring 1 cm in diameter. No definite  intraluminal filling defects are seen in the common bile duct. There is  no intrahepatic biliary dilatation.     The liver and right adrenal are within normal limits. No discrete  pancreatic mass lesion is seen. Right parapelvic renal cysts are noted.  There is right perinephric stranding. The patient is status post left  nephrectomy. The bowel is unobstructed. There is small amount of  ascites.      Impression:           1. Mildly distended gallbladder containing  gallstones and sludge,  correlation for symptoms of acute cholecystitis is recommended.  2. Dilated common bile duct measuring 1 cm in diameter. No definite  common bile duct stones identified.  3. Small amount of ascites.    Georgiana Spinner, MD   11/10/2017 9:25 AM    US Abdomen Limited RUQ [[id number]] Collected:  11/09/17 1535    Order Status:  Completed Updated:  11/09/17 1545    Narrative:       History: Abdominal pain.    FINDINGS: Ultrasound right upper quadrant was performed.    There are gallstones and sludge in the gallbladder. There is minimal  gallbladder wall thickening and minimal pericholecystic fluid. The  patient was not tender over the gallbladder, but she had received pain  medication prior to the exam. This raises the possibility of mild  cholecystitis.    The common duct and intrahepatic bile ducts are dilated. The common duct  measures 11 mm in  diameter. The cause of dilatation is not identified on  this study.    There is a 1.1 cm hypoechoic lesion in the body of the pancreas which is  indeterminate. The pancreatic duct is not dilated. The liver shows  homogeneous echotexture. The right kidney contains small cysts without  evidence of hydronephrosis. The abdominal aorta and IVC are  unremarkable.      Impression:         1. Gallstones and sludge in the gallbladder, with minimal wall  thickening and minimal pericholecystic fluid raising the possibility of  mild cholecystitis.  2. Moderate biliary dilatation to the level of the distal common duct.  The cause of dilatation is not identified on this study, and could be  further evaluated by MRCP.  3. 1.1 cm hypoechoic lesion in the body of the pancreas which is  indeterminate. Recommend further evaluation with contrast-enhanced MRI.    COMMENT: This urgent result was discussed with and acknowledged by Dr.  Ladona Ridgel, at 3:40 PM hours on 11/09/2017.    Lanier Ensign, MD   11/09/2017 3:41 PM          Signed by: Ruel Favors

## 2017-11-10 NOTE — Plan of Care (Signed)
Problem: Safety  Goal: Patient will be free from injury during hospitalization  Outcome: Progressing  Pt resting comfortably in bed, oob independently. Family at bedside. Call bell and phone within reach, pt calls for assistance as needed. Rounding done.

## 2017-11-10 NOTE — Progress Notes (Signed)
[facility]  HOSPITALIST  PROGRESS NOTE      Patient: [name] [name]  Date: 11/10/2017   LOS: 1 Days  Admission Date: 11/09/2017   MRN: [id number]  Attending: Krystal Eaton MD     ASSESSMENT/PLAN     [name] [name] is a 77 y.o. female admitted with     *ruq pain/n/v: due to mildly distended gallbladder containing stones/sludge, possible Acute Choleycystitis  -Zosyn- renally dosed  -MRCP noted, does have dilated CBD at 1 cm, no definitive CBD stones identified  -Dr. Darnelle Bos consulted and will see the patient today, continue NPO after midnight.   -GMA input appreciated      *RCC with pulmonary and bony mets  -s/p Removal of Left Kidney/Adrenal Gland ~15 months ago  - Current Chemo Regimen includes Xgeva-due this week and Sutent- this is her off week  -Follows with [facility] in [place]    *Electrolyte Abnormality  -secondary to poor po intake and vomiting  -IVF-NS at 125cc/hr  -, Hypokalemia, hypomagnesemia, replete as needed    * AKI on CKD Stage 3, in setting of one kidney, states several weeks ago a creatinine is 1.7  -Urine Cx pending  -Hydrate-recheck Creat improved 3.1-->2.5  -Avoid nephrotoxins  -Consulted Dr. Stacy Gardner by admitting physician    * HTN  -Continue home regimen    * Hypothyroidism  -Continue home dose of synthroid    * HLD  -Continue statin    *NIDDM  -Pt not currently taking medications as she was having difficulty with hypoglycemia  -Will check BS with meals.        Analgesia: prn    Nutrition: npo    Safety Checklist  DVT prophylaxis: Mechanical   Foley: Not present   IVs:  Peripheral IV   PT/OT: Not needed           Patient Lines/Drains/Airways Status    Active PICC Line / CVC Line / PIV Line / Drain / Airway / Intraosseous Line / Epidural Line / ART Line / Line / Wound / Pressure Ulcer / NG/OG Tube     Name:   Placement date:   Placement time:   Site:   Days:    Peripheral IV 11/09/17 Left Antecubital  11/09/17    1221    Antecubital    less than 1                     Code Status: full  code    DISPO: home with family    Family Contact: in room    Care Plan discussed with nursing, consultants, case manager.       SUBJECTIVE     [name] [name] states she have nausea/vomiting/right upper quadrant abdominal pain for several days, feels a little better.  Denies shortness of breath or chest pain.  No fevers overnight.  Had lower extremity edema in the past but now better.  Potassium and magnesium look better.  Awaiting surgery, abdominal pain is better, wants to eat.    MEDICATIONS     Current Facility-Administered Medications   Medication Dose Route Frequency   . amLODIPine  5 mg Oral Daily   . atorvastatin  40 mg Oral QPM   . carvedilol  6.25 mg Oral Q12H SCH   . hydrocortisone  10 mg Oral BID   . levothyroxine  50 mcg Oral Daily at 0600   . pantoprazole  40 mg Oral QHS   . piperacillin-tazobactam  2.25 g Intravenous Q8H  ROS     Remainder of 10 point ROS as above or otherwise negative    PHYSICAL EXAM     Vitals:    11/10/17 0534   BP: 155/74   Pulse: 75   Resp: 19   Temp: 97.7 F (36.5 C)   SpO2: 98%       Temperature: Temp  Min: 97.7 F (36.5 C)  Max: 98.5 F (36.9 C)  Pulse: Pulse  Min: 65  Max: 82  Respiratory: Resp  Min: 14  Max: 20  Non-Invasive BP: BP  Min: 138/90  Max: 180/82  Pulse Oximetry SpO2  Min: 96 %  Max: 100 %    Intake and Output Summary (Last 24 hours) at Date Time    Intake/Output Summary (Last 24 hours) at 11/10/17 1148  Last data filed at 11/10/17 0600   Gross per 24 hour   Intake             2637 ml   Output                0 ml   Net             2637 ml       GEN APPEARANCE: Normal;  A&OX3, elderly, frail and pale  HEENT: EOMI; anicteric sclera  NECK: Supple   CVS: RRR, S1, S2; No M/G/R  LUNGS: CTAB; No Wheezes; No Rhonchi: No rales  ABD: Soft; mild right upper quadrant TTP; + Normoactive BS, no R/G  EXT: No edema;   SKIN: No rash or Lesions  NEURO:  No gross focal neurological deficits      LABS       Recent Labs  Lab 11/10/17  0630 11/09/17  1223   WBC 10.10* 11.09*   RBC  3.31* 3.55*   Hgb 11.0* 11.9   Hematocrit 33.0* 35.1   MCV 99.7* 98.9*   Platelets 346 426*         Recent Labs  Lab 11/10/17  0630 11/10/17  0039 11/09/17  1958 11/09/17  1223   Sodium 131*  --  132* 132*   Potassium 3.5 3.6 2.4* 2.9*   Chloride 99*  --  97* 92*   CO2 17*  --  18* 21*   BUN 46*  --  49* 52*   Creatinine 2.5*  --  2.6* 3.1*   Glucose 162*  --  128* 143*   Calcium 6.9*  --  6.8* 7.5*   Magnesium 2.5 1.0*  --   --          Recent Labs  Lab 11/10/17  0630 11/09/17  1223   ALT 9 17   AST (SGOT) 21 22   Bilirubin, Total 0.4 0.4   Bilirubin, Direct  --  0.2   Albumin 3.1* 3.8   Alkaline Phosphatase 74 90                   Microbiology Results     Procedure Component Value Units Date/Time    Adult Urine culture [[id number]] Collected:  11/09/17 1605    Specimen:  Urine from Urine, Clean Catch Updated:  11/10/17 0053    Narrative:       Replace urinary catheter prior to obtaining the urine culture  if it has been in place for greater than or equal to 14  days:->N/A No Foley  Indications for Urine Culture:->Other (please specify in  Comments)           RADIOLOGY  Radiological Procedure personally reviewed and concur with radiologist reports unless stated otherwise.    MRI Abdomen WO Contrast MRCP   Final Result         1. Mildly distended gallbladder containing gallstones and sludge,   correlation for symptoms of acute cholecystitis is recommended.   2. Dilated common bile duct measuring 1 cm in diameter. No definite   common bile duct stones identified.   3. Small amount of ascites.      Georgiana Spinner, MD    11/10/2017 9:25 AM      US Abdomen Limited RUQ   Final Result      1. Gallstones and sludge in the gallbladder, with minimal wall   thickening and minimal pericholecystic fluid raising the possibility of   mild cholecystitis.   2. Moderate biliary dilatation to the level of the distal common duct.   The cause of dilatation is not identified on this study, and could be   further evaluated by MRCP.   3. 1.1  cm hypoechoic lesion in the body of the pancreas which is   indeterminate. Recommend further evaluation with contrast-enhanced MRI.      COMMENT: This urgent result was discussed with and acknowledged by Dr.   Ladona Ridgel, at 3:40 PM hours on 11/09/2017.      Lanier Ensign, MD    11/09/2017 3:41 PM          Signed,  Krystal Eaton, MD  11:48 AM 11/10/2017

## 2017-11-10 NOTE — Plan of Care (Signed)
Problem: Safety  Goal: Patient will be free from injury during hospitalization  Outcome: Progressing   11/10/17 0314   Goal/Interventions addressed this shift   Patient will be free from injury during hospitalization  Provide and maintain safe environment;Include patient/ family/ care giver in decisions related to safety     Pt. vitals stable, A &O X 4, up with assist to the bathroom, calls appropriately for assistance prior to getting OOB. Labs drawn - with Pt. receiving replacement Potasium, Magnesium and fluids lost from nausea, vomiting and diarrhea. IV site intact,denies any pain, husband at bedside all shift.    Problem: Altered GI Function  Goal: Elimination patterns are normal or improving   11/10/17 0314   Goal/Interventions addressed this shift   Elimination patterns are normal or improving Assess for normal bowel sounds;Monitor for abdominal discomfort;Administer treatments as ordered;Assess for flatus;Reinforce education on foods that improve and complicate bowel movements and how activity and medications can affect bowel movements;Encourage /perform oral hygiene as appropriate     Pt. reports intermittent loose stools, nausea and small amounts of sputum/emesis. PRN dose of Zofran and scheduled antibiotic given. Pt.on continuous IVF, nausea managable, up to 3-4 loose stools so far.

## 2017-11-10 NOTE — Telephone Encounter (Signed)
Spoke to patient regarding vm she left earlier. Patient is in the hospital. Will call to r/s at a later time.

## 2017-11-11 ENCOUNTER — Ambulatory Visit: Payer: Medicare Other

## 2017-11-11 ENCOUNTER — Other Ambulatory Visit: Payer: Medicare Other

## 2017-11-11 ENCOUNTER — Ambulatory Visit: Payer: Medicare Other | Admitting: Hematology

## 2017-11-11 LAB — GLUCOSE WHOLE BLOOD - POCT
Whole Blood Glucose POCT: 132 mg/dL — ABNORMAL HIGH (ref 70–100)
Whole Blood Glucose POCT: 138 mg/dL — ABNORMAL HIGH (ref 70–100)
Whole Blood Glucose POCT: 165 mg/dL — ABNORMAL HIGH (ref 70–100)

## 2017-11-11 LAB — GFR: EGFR: 21.7

## 2017-11-11 LAB — CBC WITH MANUAL DIFFERENTIAL
Absolute NRBC: 0 10*3/uL (ref 0.00–0.00)
Band Neutrophils Absolute: 0.6 10*3/uL (ref 0.00–1.00)
Band Neutrophils: 7 %
Basophils Absolute Manual: 0.09 10*3/uL — ABNORMAL HIGH (ref 0.00–0.08)
Basophils Manual: 1 %
Cell Morphology: NORMAL
Eosinophils Absolute Manual: 0 10*3/uL (ref 0.00–0.44)
Eosinophils Manual: 0 %
Hematocrit: 36.1 % (ref 34.7–43.7)
Hgb: 11.7 g/dL (ref 11.4–14.8)
Lymphocytes Absolute Manual: 1.12 10*3/uL (ref 0.42–3.22)
Lymphocytes Manual: 13 %
MCH: 33.7 pg — ABNORMAL HIGH (ref 25.1–33.5)
MCHC: 32.4 g/dL (ref 31.5–35.8)
MCV: 104 fL — ABNORMAL HIGH (ref 78.0–96.0)
MPV: 9.5 fL (ref 8.9–12.5)
Monocytes Absolute: 0.35 10*3/uL (ref 0.21–0.85)
Monocytes Manual: 4 %
Neutrophils Absolute Manual: 6.48 10*3/uL — ABNORMAL HIGH (ref 1.10–6.33)
Nucleated RBC: 0 /100 WBC (ref 0.0–0.0)
Platelet Estimate: NORMAL
Platelets: 314 10*3/uL (ref 142–346)
RBC: 3.47 10*6/uL — ABNORMAL LOW (ref 3.90–5.10)
RDW: 15 % (ref 11–15)
Segmented Neutrophils: 75 %
WBC: 8.64 10*3/uL (ref 3.10–9.50)

## 2017-11-11 LAB — BASIC METABOLIC PANEL
Anion Gap: 13 (ref 5.0–15.0)
BUN: 33 mg/dL — ABNORMAL HIGH (ref 7–19)
CO2: 17 mEq/L — ABNORMAL LOW (ref 22–29)
Calcium: 6.5 mg/dL — ABNORMAL LOW (ref 7.9–10.2)
Chloride: 103 mEq/L (ref 100–111)
Creatinine: 2.2 mg/dL — ABNORMAL HIGH (ref 0.6–1.0)
Glucose: 140 mg/dL — ABNORMAL HIGH (ref 70–100)
Potassium: 3.4 mEq/L — ABNORMAL LOW (ref 3.5–5.1)
Sodium: 133 mEq/L — ABNORMAL LOW (ref 136–145)

## 2017-11-11 MED ORDER — CLOBETASOL PROPIONATE 0.05 % EX CREA
TOPICAL_CREAM | Freq: Two times a day (BID) | CUTANEOUS | Status: DC | PRN
Start: 2017-11-11 — End: 2017-11-13
  Filled 2017-11-11: qty 15

## 2017-11-11 NOTE — Plan of Care (Signed)
Problem: Moderate/High Fall Risk Score >5  Goal: Patient will remain free of falls  Outcome: Progressing   11/11/17 0653   OTHER   High (Greater than 13) LOW-Fall Interventions Appropriate for Low Fall Risk;MOD-Initiate Yellow "Fall Risk" magnet communication tool;MOD-Remain with patient during toileting;MOD-Use of assistive devices -Bedside Commode if appropriate;MOD-Include family in multidisciplinary POC discussions;MOD-Place Fall Risk level on whiteboard in room;HIGH-Activate bed/chair exit alarm where available;HIGH-Apply yellow "Fall Risk" arm band   High falls risk. Assisted oob to br. New bed obtained since bed alarm not working. Daughter @bedside  all night.     Problem: Altered GI Function  Goal: Nutritional intake is adequate  Outcome: Progressing  No reports of nausea. Able to tolerate po intake and swallow medications. Ivf infusing.

## 2017-11-11 NOTE — Plan of Care (Signed)
Problem: Altered GI Function  Goal: Elimination patterns are normal or improving  Outcome: Progressing   11/11/17 1750   Goal/Interventions addressed this shift   Elimination patterns are normal or improving Report abnormal assessment to physician;Anticipate/assist with toileting needs;Assess for normal bowel sounds   Normal abdominal exam.   No n/v or abdominal pain throughout AM shift.   Tolerated clears, advanced to full.   Tolerated fulls well.   Plan to advance to low fat. Per team, if tolerates well, no sx interventions needed.

## 2017-11-11 NOTE — Progress Notes (Signed)
[facility]  HOSPITALIST  PROGRESS NOTE      Patient: [name] [name]  Date: 11/11/2017   LOS: 2 Days  Admission Date: 11/09/2017   MRN: [id number]  Attending: Krystal Eaton MD     ASSESSMENT/PLAN     [name] [name] is a 77 y.o. female admitted with     *ruq pain/n/v: due to mildly distended gallbladder containing stones/sludge, possible Acute Choleycystitis  -Zosyn- renally dosed  -MRCP noted, does have dilated CBD at 1 cm, no definitive CBD stones identified  -surg on board, conservative treatment and if failed cholecystostomy tube is recommended.  So far patient has improved with resolution of pain, denies nausea vomiting and tolerating clears which was started yesterday, advance to full liquid, continue antibiotics  -GMA input appreciated      *RCC with pulmonary and bony mets  -s/p Removal of Left Kidney/Adrenal Gland ~15 months ago  - Current Chemo Regimen includes Xgeva-due this week and Sutent- this is her off week  -Follows with [facility] in [place]    *Electrolyte Abnormality  -secondary to poor po intake and vomiting  - Hypokalemia, hypomagnesemia, replete as needed    * AKI on CKD Stage 3, in setting of one kidney, states several weeks ago a creatinine is 1.7  -Urine Cx pending  -Hydrate-recheck Creat improved 3.1-->2.2  -Avoid nephrotoxins  -Consulted Dr. Stacy Gardner.  Appreciate input    * HTN  -Continue home regimen  -PRN hydralazine     * Hypothyroidism  -Continue home dose of synthroid    * HLD  -Continue statin    *NIDDM  -Pt not currently taking medications as she was having difficulty with hypoglycemia  -Will check BS with meals.        Analgesia: prn    Nutrition: fld    Safety Checklist  DVT prophylaxis: Mechanical   Foley: Not present   IVs:  Peripheral IV   PT/OT: Not needed           Patient Lines/Drains/Airways Status    Active PICC Line / CVC Line / PIV Line / Drain / Airway / Intraosseous Line / Epidural Line / ART Line / Line / Wound / Pressure Ulcer / NG/OG Tube     Name:   Placement  date:   Placement time:   Site:   Days:    Peripheral IV 11/09/17 Left Antecubital  11/09/17    1221    Antecubital    less than 1                     Code Status: full code    DISPO: home with family    Family Contact: in room    Care Plan discussed with nursing, consultants, case manager.       SUBJECTIVE     [name] [name] states she have no significant nausea vomiting or abdominal pain, tolerating clears once it advanced.  No fevers overnight.  Ambulating without shortness of breath.  Denies chest pain, no fevers overnight.  Chronic watery stools.  MEDICATIONS     Current Facility-Administered Medications   Medication Dose Route Frequency   . amLODIPine  5 mg Oral Daily   . atorvastatin  40 mg Oral QPM   . carvedilol  6.25 mg Oral Q12H SCH   . hydrocortisone  10 mg Oral BID   . levothyroxine  50 mcg Oral Daily at 0600   . pantoprazole  40 mg Oral QHS   . piperacillin-tazobactam  2.25  g Intravenous Q8H       ROS     Remainder of 10 point ROS as above or otherwise negative    PHYSICAL EXAM     Vitals:    11/11/17 0916   BP: 155/87   Pulse: 66   Resp:    Temp:    SpO2:        Temperature: Temp  Min: 97.2 F (36.2 C)  Max: 98.1 F (36.7 C)  Pulse: Pulse  Min: 61  Max: 67  Respiratory: Resp  Min: 16  Max: 18  Non-Invasive BP: BP  Min: 149/80  Max: 168/83  Pulse Oximetry SpO2  Min: 97 %  Max: 99 %    Intake and Output Summary (Last 24 hours) at Date Time    Intake/Output Summary (Last 24 hours) at 11/11/17 1150  Last data filed at 11/11/17 1043   Gross per 24 hour   Intake             1698 ml   Output                0 ml   Net             1698 ml       GEN APPEARANCE: Normal;  A&OX3, elderly, frail and pale  HEENT: EOMI; anicteric sclera  NECK: Supple   CVS: RRR, S1, S2; No M/G/R  LUNGS: CTAB; No Wheezes; No Rhonchi: No rales  ABD: Soft; no sig TTP; + Normoactive BS, no R/G  EXT: No edema;   SKIN: No rash or Lesions  NEURO:  No gross focal neurological deficits      LABS       Recent Labs  Lab 11/11/17  0633  11/10/17  0630 11/09/17  1223   WBC 8.64 10.10* 11.09*   RBC 3.47* 3.31* 3.55*   Hgb 11.7 11.0* 11.9   Hematocrit 36.1 33.0* 35.1   MCV 104.0* 99.7* 98.9*   Platelets 314 346 426*         Recent Labs  Lab 11/11/17  0633 11/10/17  0630 11/10/17  0039 11/09/17  1958 11/09/17  1223   Sodium 133* 131*  --  132* 132*   Potassium 3.4* 3.5 3.6 2.4* 2.9*   Chloride 103 99*  --  97* 92*   CO2 17* 17*  --  18* 21*   BUN 33* 46*  --  49* 52*   Creatinine 2.2* 2.5*  --  2.6* 3.1*   Glucose 140* 162*  --  128* 143*   Calcium 6.5* 6.9*  --  6.8* 7.5*   Magnesium  --  2.5 1.0*  --   --          Recent Labs  Lab 11/10/17  0630 11/09/17  1223   ALT 9 17   AST (SGOT) 21 22   Bilirubin, Total 0.4 0.4   Bilirubin, Direct  --  0.2   Albumin 3.1* 3.8   Alkaline Phosphatase 74 90                   Microbiology Results     Procedure Component Value Units Date/Time    Adult Urine culture [[id number]] Collected:  11/09/17 1605    Specimen:  Urine from Urine, Clean Catch Updated:  11/10/17 0053    Narrative:       Replace urinary catheter prior to obtaining the urine culture  if it has been in place for greater than or equal  to 14  days:->N/A No Foley  Indications for Urine Culture:->Other (please specify in  Comments)           RADIOLOGY     Radiological Procedure personally reviewed and concur with radiologist reports unless stated otherwise.    MRI Abdomen WO Contrast MRCP   Final Result         1. Mildly distended gallbladder containing gallstones and sludge,   correlation for symptoms of acute cholecystitis is recommended.   2. Dilated common bile duct measuring 1 cm in diameter. No definite   common bile duct stones identified.   3. Small amount of ascites.      Georgiana Spinner, MD    11/10/2017 9:25 AM      US Abdomen Limited RUQ   Final Result      1. Gallstones and sludge in the gallbladder, with minimal wall   thickening and minimal pericholecystic fluid raising the possibility of   mild cholecystitis.   2. Moderate biliary dilatation to  the level of the distal common duct.   The cause of dilatation is not identified on this study, and could be   further evaluated by MRCP.   3. 1.1 cm hypoechoic lesion in the body of the pancreas which is   indeterminate. Recommend further evaluation with contrast-enhanced MRI.      COMMENT: This urgent result was discussed with and acknowledged by Dr.   Ladona Ridgel, at 3:40 PM hours on 11/09/2017.      Lanier Ensign, MD    11/09/2017 3:41 PM          Signed,  Krystal Eaton, MD  11:50 AM 11/11/2017

## 2017-11-11 NOTE — Progress Notes (Signed)
PROGRESS NOTE    Date Time: 11/11/17 9:17 PM  Patient Name: [facility]                                NEPHROLOGY PROGRESS NOTE    Follow up for acute renal failure  Subjective:   Patient states she is still having a poor appetite.     Medications:      Scheduled Meds: PRN Meds:      amLODIPine 5 mg Oral Daily   atorvastatin 40 mg Oral QPM   carvedilol 6.25 mg Oral Q12H SCH   hydrocortisone 10 mg Oral BID   levothyroxine 50 mcg Oral Daily at 0600   pantoprazole 40 mg Oral QHS   piperacillin-tazobactam 2.25 g Intravenous Q8H       Continuous Infusions:  . sodium chloride 75 mL/hr at 11/11/17 1038      acetaminophen 650 mg Q6H PRN   clobetasol  BID PRN   hydrALAZINE 10 mg Q6H PRN   naloxone 0.2 mg PRN   ondansetron 4 mg Q6H PRN   Or     ondansetron 4 mg Q6H PRN   prochlorperazine 10 mg Q6H PRN   traMADol 50 mg Q6H PRN         Review of Systems:    No complaints of chest pain, palpitations shortness of breath, cough, headaches, dizziness, joint pain, abdominal pain,  or fevers.     Physical Exam:   Patient Vitals for the past 24 hrs:   BP Temp Temp src Pulse Resp SpO2   11/11/17 2031 148/78 97.9 F (36.6 C) Oral 64 - 98 %   11/11/17 1220 162/84 97.9 F (36.6 C) Oral 60 16 98 %   11/11/17 0916 155/87 - - 66 - -   11/11/17 0456 157/89 97.2 F (36.2 C) Oral 67 16 98 %       Intake and Output Summary (Last 24 hours) at Date Time    Intake/Output Summary (Last 24 hours) at 11/11/17 2117  Last data filed at 11/11/17 1043   Gross per 24 hour   Intake             1578 ml   Output                0 ml   Net             1578 ml       General appearance - alert, well appearing, and in no distress  Chest - clear to auscultation, no wheezes, rales or rhonchi, symmetric air entry  Heart - S1 and S2 normal  Abdomen - soft  Neurological - alert, normal speech  Extremities - no pedal edema noted, no clubbing or cyanosis    Labs:     Recent Labs  Lab 11/11/17  0633 11/10/17  0630 11/09/17  1223   WBC 8.64 10.10* 11.09*   RBC 3.47* 3.31*  3.55*   Hgb 11.7 11.0* 11.9   Hematocrit 36.1 33.0* 35.1   MCV 104.0* 99.7* 98.9*   MCHC 32.4 33.3 33.9   RDW 15 15 15    MPV 9.5 9.7 9.5   Platelets 314 346 426*           Recent Labs  Lab 11/11/17  0633 11/10/17  0630 11/10/17  0039 11/09/17  1958 11/09/17  1223   Sodium 133* 131*  --  132* 132*   Potassium 3.4* 3.5 3.6  2.4* 2.9*   Chloride 103 99*  --  97* 92*   CO2 17* 17*  --  18* 21*   BUN 33* 46*  --  49* 52*   Creatinine 2.2* 2.5*  --  2.6* 3.1*   Glucose 140* 162*  --  128* 143*   Calcium 6.5* 6.9*  --  6.8* 7.5*   Magnesium  --  2.5 1.0*  --   --          Results     Procedure Component Value Units Date/Time    Glucose Whole Blood - POCT [[id number]]  (Abnormal) Collected:  11/11/17 1648     Updated:  11/11/17 1655     POCT - Glucose Whole blood 165 (H) mg/dL     Glucose Whole Blood - POCT [[id number]]  (Abnormal) Collected:  11/11/17 1154     Updated:  11/11/17 1157     POCT - Glucose Whole blood 138 (H) mg/dL     Glucose Whole Blood - POCT [[id number]]  (Abnormal) Collected:  11/11/17 0732     Updated:  11/11/17 0752     POCT - Glucose Whole blood 132 (H) mg/dL     Adult Urine culture [[id number]] Collected:  11/09/17 1605    Specimen:  Urine from Urine, Clean Catch Updated:  11/11/17 6295    Narrative:       Replace urinary catheter prior to obtaining the urine culture  if it has been in place for greater than or equal to 14  days:->N/A No Foley  Indications for Urine Culture:->Other (please specify in  Comments)  ORDER#: [id number]                                    ORDERED BY: [name]  SOURCE: Urine, Clean Catch                           COLLECTED:  11/09/17 16:05  ANTIBIOTICS AT COLL.:                                RECEIVED :  11/10/17 00:53  Culture Urine                              FINAL       11/11/17 07:28  11/11/17   50,000 - 70,000 CFU/ML of normal urogenital or skin microbiota, no             further work      CBC WITH MANUAL DIFFERENTIAL [[id number]]  (Abnormal) Collected:  11/11/17 4403      Updated:  11/11/17 0722     WBC 8.64 x10 3/uL      Hgb 11.7 g/dL      Hematocrit 47.4 %      Platelets 314 x10 3/uL      RBC 3.47 (L) x10 6/uL      MCV 104.0 (H) fL      MCH 33.7 (H) pg      MCHC 32.4 g/dL      RDW 15 %      MPV 9.5 fL      Nucleated RBC 0.0 /100 WBC      Absolute NRBC 0.00 x10 3/uL      Segmented Neutrophils 75 %  Band Neutrophils 7 %      Lymphocytes Manual 13 %      Monocytes Manual 4 %      Eosinophils Manual 0 %      Basophils Manual 1 %      Abs Seg Manual 6.48 (H) x10 3/uL      Bands Absolute 0.60 x10 3/uL      Absolute Lymph Manual 1.12 x10 3/uL      Monocytes Absolute 0.35 x10 3/uL      Absolute Eos Manual 0.00 x10 3/uL      Absolute Baso Manual 0.09 (H) x10 3/uL      Cell Morphology: Normal     Platelet Estimate Normal    GFR [[id number]] Collected:  11/11/17 0633     Updated:  11/11/17 0706     EGFR 21.7    Basic Metabolic Panel [[id number]]  (Abnormal) Collected:  11/11/17 0633    Specimen:  Blood Updated:  11/11/17 0706     Glucose 140 (H) mg/dL      BUN 33 (H) mg/dL      Creatinine 2.2 (H) mg/dL      Calcium 6.5 (L) mg/dL      Sodium 782 (L) mEq/L      Potassium 3.4 (L) mEq/L      Chloride 103 mEq/L      CO2 17 (L) mEq/L      Anion Gap 13.0    Glucose Whole Blood - POCT [[id number]]  (Abnormal) Collected:  11/10/17 2221     Updated:  11/10/17 2224     POCT - Glucose Whole blood 118 (H) mg/dL               Rads:   Radiological Procedure reviewed.    Signed by: Henrine Screws, MD    Assessment:   1) Renal: AKI on CKD, related to pre-renal or ATN Creatinine continues to improve. I would continue IV fluids at the current rate.     2) Hypertension: Adequate control with norvasc, coreg,     3) Hyponatremia: Related to solute depletion. Continue IV fluids    4) Acidosis: Nongap acidosis. No intervention at this time.       Plan:   Continue IV fluids  Monitor blood pressure  Avoid nephrotoxins      Henrine Screws, MD  [facility]  Spectra# [phone]  Pg# [phone]   Office # [phone]  After 430 PM, please call the office for person on call  On Weekends, call the office for the person on call

## 2017-11-11 NOTE — Progress Notes (Signed)
GENERAL SURGERY PROGRESS NOTE  [facility] [phone]    Date Time: 11/11/17 12:02 PM  Patient Name: [facility] Day: 2    ASSESSMENT:     Patient Active Problem List   Diagnosis   . 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 On chemo since May 2018, last dose 11/08/2016.  - PMH: HTN, HLD, Thyroid disorder, DM    - mild acute cholecystitis  - CBD dilation w/o evidence of choledocholithiasis on [name] or MRCP  - US demonstrating 1.1cm hypoechoic mass, MRCP wnl regarding pancreas  - AKI    PLAN:     -Leukocytosis resolved; IV Zosyn  -AKI; Bun/Crt normalizing   -Abdominal exam benign  -Reinforced plan of care and avoidance of surgery due to active chemotherapy and additional co-morbidities  -Recommend primary team advance to low fat diet.   -Recommend if pt continues to have abdominal pain with diet advancement to schedule percutaneous cholecystostomy tube placed.   -No further input from surgery; if any additional questions arise please do not hesitate to call [facility].     SUBJECTIVE:   The patient is doing well.  Abdominal pain is  absent.  Current dietary status:  Diet full liquid and tolerating well.  Flatus: yes. BM:  yes.  Additional complaints: none     Objective:   Current Vitals:   Vitals:    11/11/17 0916   BP: 155/87   Pulse: 66   Resp:    Temp:    SpO2:        Intake and Output Summary (Last 24 hours):  I/O last 3 completed shifts:  In: 2715 [P.O.:360; I.V.:2355]  Out: -     Labs:     Results     Procedure Component Value Units Date/Time    Glucose Whole Blood - POCT [[id number]]  (Abnormal) Collected:  11/11/17 1154     Updated:  11/11/17 1157     POCT - Glucose Whole blood 138 (H) mg/dL     Glucose Whole Blood - POCT [[id number]]  (Abnormal) Collected:  11/11/17 0732     Updated:  11/11/17 0752     POCT - Glucose Whole blood 132 (H) mg/dL     Adult Urine culture [[id number]] Collected:  11/09/17 1605    Specimen:  Urine from  Urine, Clean Catch Updated:  11/11/17 0728    Narrative:       Replace urinary catheter prior to obtaining the urine culture  if it has been in place for greater than or equal to 14  days:->N/A No Foley  Indications for Urine Culture:->Other (please specify in  Comments)  ORDER#: [id number]                                    ORDERED BY: [name]  SOURCE: Urine, Clean Catch                           COLLECTED:  11/09/17 16:05  ANTIBIOTICS AT COLL.:                                RECEIVED :  11/10/17 00:53  Culture Urine  FINAL       11/11/17 07:28  11/11/17   50,000 - 70,000 CFU/ML of normal urogenital or skin microbiota, no             further work      CBC WITH MANUAL DIFFERENTIAL [[id number]]  (Abnormal) Collected:  11/11/17 0633     Updated:  11/11/17 0722     WBC 8.64 x10 3/uL      Hgb 11.7 g/dL      Hematocrit 40.9 %      Platelets 314 x10 3/uL      RBC 3.47 (L) x10 6/uL      MCV 104.0 (H) fL      MCH 33.7 (H) pg      MCHC 32.4 g/dL      RDW 15 %      MPV 9.5 fL      Nucleated RBC 0.0 /100 WBC      Absolute NRBC 0.00 x10 3/uL      Segmented Neutrophils 75 %      Band Neutrophils 7 %      Lymphocytes Manual 13 %      Monocytes Manual 4 %      Eosinophils Manual 0 %      Basophils Manual 1 %      Abs Seg Manual 6.48 (H) x10 3/uL      Bands Absolute 0.60 x10 3/uL      Absolute Lymph Manual 1.12 x10 3/uL      Monocytes Absolute 0.35 x10 3/uL      Absolute Eos Manual 0.00 x10 3/uL      Absolute Baso Manual 0.09 (H) x10 3/uL      Cell Morphology: Normal     Platelet Estimate Normal    GFR [[id number]] Collected:  11/11/17 0633     Updated:  11/11/17 0706     EGFR 21.7    Basic Metabolic Panel [[id number]]  (Abnormal) Collected:  11/11/17 0633    Specimen:  Blood Updated:  11/11/17 0706     Glucose 140 (H) mg/dL      BUN 33 (H) mg/dL      Creatinine 2.2 (H) mg/dL      Calcium 6.5 (L) mg/dL      Sodium 578 (L) mEq/L      Potassium 3.4 (L) mEq/L      Chloride 103 mEq/L      CO2 17 (L)  mEq/L      Anion Gap 13.0    Glucose Whole Blood - POCT [[id number]]  (Abnormal) Collected:  11/10/17 2221     Updated:  11/10/17 2224     POCT - Glucose Whole blood 118 (H) mg/dL     Glucose Whole Blood - POCT [[id number]]  (Abnormal) Collected:  11/10/17 1702     Updated:  11/10/17 1705     POCT - Glucose Whole blood 243 (H) mg/dL           Rads:     Radiology Results (24 Hour)     ** No results found for the last 24 hours. **            Medications:     Current Facility-Administered Medications   Medication Dose Route Frequency   . amLODIPine  5 mg Oral Daily   . atorvastatin  40 mg Oral QPM   . carvedilol  6.25 mg Oral Q12H SCH   . hydrocortisone  10 mg Oral BID   . levothyroxine  50 mcg  Oral Daily at 0600   . pantoprazole  40 mg Oral QHS   . piperacillin-tazobactam  2.25 g Intravenous Q8H     . sodium chloride 75 mL/hr at 11/11/17 1038     acetaminophen, hydrALAZINE, naloxone, ondansetron **OR** ondansetron, prochlorperazine, traMADol     Physical Exam:     General appearance - alert, well appearing, and in no distress  Mental status - alert, oriented to person, place, and time  Chest - no tachypnea, retractions or cyanosis  Heart - normal rate and regular rhythm  Abdomen - soft, nontender, nondistended, no masses or organomegaly  Extremities - warm dry and well perfused    Signed by: Si Raider Nolton Denis

## 2017-11-12 LAB — CBC WITH MANUAL DIFFERENTIAL
Absolute NRBC: 0 10*3/uL (ref 0.00–0.00)
Band Neutrophils Absolute: 0.25 10*3/uL (ref 0.00–1.00)
Band Neutrophils: 4 %
Basophils Absolute Manual: 0 10*3/uL (ref 0.00–0.08)
Basophils Manual: 0 %
Cell Morphology: NORMAL
Eosinophils Absolute Manual: 0 10*3/uL (ref 0.00–0.44)
Eosinophils Manual: 0 %
Hematocrit: 32 % — ABNORMAL LOW (ref 34.7–43.7)
Hgb: 10.5 g/dL — ABNORMAL LOW (ref 11.4–14.8)
Lymphocytes Absolute Manual: 1.34 10*3/uL (ref 0.42–3.22)
Lymphocytes Manual: 21 %
MCH: 32.9 pg (ref 25.1–33.5)
MCHC: 32.8 g/dL (ref 31.5–35.8)
MCV: 100.3 fL — ABNORMAL HIGH (ref 78.0–96.0)
MPV: 9.4 fL (ref 8.9–12.5)
Monocytes Absolute: 0.38 10*3/uL (ref 0.21–0.85)
Monocytes Manual: 6 %
Neutrophils Absolute Manual: 4.39 10*3/uL (ref 1.10–6.33)
Nucleated RBC: 0 /100 WBC (ref 0.0–0.0)
Platelet Estimate: NORMAL
Platelets: 295 10*3/uL (ref 142–346)
RBC: 3.19 10*6/uL — ABNORMAL LOW (ref 3.90–5.10)
RDW: 15 % (ref 11–15)
Segmented Neutrophils: 69 %
WBC: 6.36 10*3/uL (ref 3.10–9.50)

## 2017-11-12 LAB — BASIC METABOLIC PANEL
Anion Gap: 11 (ref 5.0–15.0)
BUN: 24 mg/dL — ABNORMAL HIGH (ref 7–19)
CO2: 16 mEq/L — ABNORMAL LOW (ref 22–29)
Calcium: 6.2 mg/dL — ABNORMAL LOW (ref 7.9–10.2)
Chloride: 108 mEq/L (ref 100–111)
Creatinine: 1.8 mg/dL — ABNORMAL HIGH (ref 0.6–1.0)
Glucose: 136 mg/dL — ABNORMAL HIGH (ref 70–100)
Potassium: 3.4 mEq/L — ABNORMAL LOW (ref 3.5–5.1)
Sodium: 135 mEq/L — ABNORMAL LOW (ref 136–145)

## 2017-11-12 LAB — GLUCOSE WHOLE BLOOD - POCT
Whole Blood Glucose POCT: 126 mg/dL — ABNORMAL HIGH (ref 70–100)
Whole Blood Glucose POCT: 170 mg/dL — ABNORMAL HIGH (ref 70–100)
Whole Blood Glucose POCT: 132 mg/dL — ABNORMAL HIGH (ref 70–100)

## 2017-11-12 LAB — GFR: EGFR: 27.4

## 2017-11-12 MED ORDER — CALCIUM CARBONATE ANTACID 500 MG PO CHEW
500.00 mg | CHEWABLE_TABLET | Freq: Two times a day (BID) | ORAL | Status: DC
Start: 2017-11-12 — End: 2017-11-13
  Administered 2017-11-12 – 2017-11-13 (×2): 500 mg via ORAL
  Filled 2017-11-12 (×2): qty 1

## 2017-11-12 NOTE — Progress Notes (Incomplete)
Patient provided verbal consent earlier for animal assisted intervention with Marga Hoots (dog) and handler Gearldine Bienenstock).  Patient smiling, laughing and talking about her late dog.

## 2017-11-12 NOTE — Progress Notes (Signed)
PROGRESS NOTE    Date Time: 11/12/17 2:32 PM  Patient Name: [name] [name]                                NEPHROLOGY PROGRESS NOTE    Follow up for acute renal failure  Subjective:   Patient has no complaints today    Medications:      Scheduled Meds: PRN Meds:      amLODIPine 5 mg Oral Daily   atorvastatin 40 mg Oral QPM   carvedilol 6.25 mg Oral Q12H SCH   hydrocortisone 10 mg Oral BID   levothyroxine 50 mcg Oral Daily at 0600   pantoprazole 40 mg Oral QHS   piperacillin-tazobactam 2.25 g Intravenous Q8H       Continuous Infusions:  . sodium chloride 75 mL/hr at 11/11/17 2258      acetaminophen 650 mg Q6H PRN   clobetasol  BID PRN   hydrALAZINE 10 mg Q6H PRN   naloxone 0.2 mg PRN   ondansetron 4 mg Q6H PRN   Or     ondansetron 4 mg Q6H PRN   prochlorperazine 10 mg Q6H PRN   traMADol 50 mg Q6H PRN         Review of Systems:    No complaints of chest pain, palpitations shortness of breath, cough, headaches, dizziness, joint pain, abdominal pain, nausea, decreased appetite, or fevers.     Physical Exam:   Patient Vitals for the past 24 hrs:   BP Temp Temp src Pulse Resp SpO2   11/12/17 0919 148/75 - - (!) 59 - -   11/12/17 0450 158/71 98.1 F (36.7 C) Oral (!) 55 17 98 %   11/11/17 2232 143/75 98.6 F (37 C) Oral (!) 58 - 98 %   11/11/17 2031 148/78 97.9 F (36.6 C) Oral 64 17 98 %       Intake and Output Summary (Last 24 hours) at Date Time    Intake/Output Summary (Last 24 hours) at 11/12/17 1432  Last data filed at 11/12/17 0505   Gross per 24 hour   Intake             1841 ml   Output                0 ml   Net             1841 ml       General appearance - alert, well appearing, and in no distress  Chest - clear to auscultation, no wheezes, rales or rhonchi, symmetric air entry  Heart - S1 and S2 normal  Abdomen - soft, no tenderness  Neurological - alert, normal speech  Extremities - no pedal edema noted, no clubbing or cyanosis    Labs:     Recent Labs  Lab 11/12/17  0608 11/11/17  0633 11/10/17  0630   WBC 6.36  8.64 10.10*   RBC 3.19* 3.47* 3.31*   Hgb 10.5* 11.7 11.0*   Hematocrit 32.0* 36.1 33.0*   MCV 100.3* 104.0* 99.7*   MCHC 32.8 32.4 33.3   RDW 15 15 15    MPV 9.4 9.5 9.7   Platelets 295 314 346           Recent Labs  Lab 11/12/17  0608 11/11/17  0633 11/10/17  0630 11/10/17  0039 11/09/17  1958 11/09/17  1223   Sodium 135* 133* 131*  --  132* 132*  Potassium 3.4* 3.4* 3.5 3.6 2.4* 2.9*   Chloride 108 103 99*  --  97* 92*   CO2 16* 17* 17*  --  18* 21*   BUN 24* 33* 46*  --  49* 52*   Creatinine 1.8* 2.2* 2.5*  --  2.6* 3.1*   Glucose 136* 140* 162*  --  128* 143*   Calcium 6.2* 6.5* 6.9*  --  6.8* 7.5*   Magnesium  --   --  2.5 1.0*  --   --          Results     Procedure Component Value Units Date/Time    Glucose Whole Blood - POCT [[id number]]  (Abnormal) Collected:  11/12/17 1131     Updated:  11/12/17 1149     POCT - Glucose Whole blood 170 (H) mg/dL     CBC WITH MANUAL DIFFERENTIAL [[id number]]  (Abnormal) Collected:  11/12/17 0608     Updated:  11/12/17 0812     WBC 6.36 x10 3/uL      Hgb 10.5 (L) g/dL      Hematocrit 84.6 (L) %      Platelets 295 x10 3/uL      RBC 3.19 (L) x10 6/uL      MCV 100.3 (H) fL      MCH 32.9 pg      MCHC 32.8 g/dL      RDW 15 %      MPV 9.4 fL      Nucleated RBC 0.0 /100 WBC      Absolute NRBC 0.00 x10 3/uL      Segmented Neutrophils 69 %      Band Neutrophils 4 %      Lymphocytes Manual 21 %      Monocytes Manual 6 %      Eosinophils Manual 0 %      Basophils Manual 0 %      Abs Seg Manual 4.39 x10 3/uL      Bands Absolute 0.25 x10 3/uL      Absolute Lymph Manual 1.34 x10 3/uL      Monocytes Absolute 0.38 x10 3/uL      Absolute Eos Manual 0.00 x10 3/uL      Absolute Baso Manual 0.00 x10 3/uL      Cell Morphology: Normal     Platelet Estimate Normal    Glucose Whole Blood - POCT [[id number]]  (Abnormal) Collected:  11/12/17 0734     Updated:  11/12/17 0748     POCT - Glucose Whole blood 126 (H) mg/dL     Basic Metabolic Panel [[id number]]  (Abnormal) Collected:  11/12/17 0608     Specimen:  Blood Updated:  11/12/17 0702     Glucose 136 (H) mg/dL      BUN 24 (H) mg/dL      Creatinine 1.8 (H) mg/dL      Calcium 6.2 (L) mg/dL      Sodium 253 (L) mEq/L      Potassium 3.4 (L) mEq/L      Chloride 108 mEq/L      CO2 16 (L) mEq/L      Anion Gap 11.0    GFR [[id number]] Collected:  11/12/17 0608     Updated:  11/12/17 0702     EGFR 27.4    Glucose Whole Blood - POCT [[id number]]  (Abnormal) Collected:  11/11/17 1648     Updated:  11/11/17 1655     POCT - Glucose Whole blood 165 (H)  mg/dL               Rads:   Radiological Procedure reviewed.    Signed by: Henrine Screws, MD    Assessment:   1) Renal: AKI, probably pre-renal. Improving with IV fluids. I will stop IV fluids today.     2) Hypertension: Adequate control with norvasc, coreg.     3) Hyponatremia: Related to solute depletion. Encourage PO solute intake    4) Acidosis: Nongap acidosis. No intervention at this time.     5) Hypocalcemia: Related to medications. I will restart her calcium tablets to bid.     Plan:   I will stop her IV fluids  Encourage PO fluid intake.   Monitor blood pressure  Calcium bid.       Henrine Screws, MD  [facility]  Spectra# [phone]  Pg# [phone]  Office # [phone]  After 430 PM, please call the office for person on call  On Weekends, call the office for the person on call

## 2017-11-12 NOTE — Plan of Care (Signed)
Problem: Altered GI Function  Goal: Elimination patterns are normal or improving  Outcome: Progressing   11/12/17 1610   Goal/Interventions addressed this shift   Elimination patterns are normal or improving Monitor for abdominal distension;Monitor for abdominal discomfort;Assess for signs and symptoms of bleeding. Report signs of bleeding to physician;Administer treatments as ordered;Assess for normal bowel sounds     Patient alert and oriented x 4, family at bedside.  Low fall risk.  Episode of loose BM this shift.  Tolerating diet well.  IVF infusing at 75 ml/ hr.  Zosyn administered as ordered.  Call bell within reach.  VSS.  Will continue to monitor.

## 2017-11-12 NOTE — Consults (Signed)
Brief Nutrition Note    77 y/o F p/w vomiting x24-48 hours PTA. PMH significant for renal cell carcinoma with pulmonary, bony, and adrenal mets s/p L nephrectomy and L adrenal gland removal on chemo; HLD; HTN; hypothyroidism; T2DM. PET scan showed gallstones with gallbladder wall thickening. Chronic watery stools while on chemo. Plan for conservative management with IVabx. Plan for percutaneous cholecystostomy tube if abdominal pain continues.     Received RN Consult for pt with questions re: low fat diet and managing BG. Diet advanced from full liquids to low fat this AM- was tolerating full liquids without pain. Reports N/V resolved. Denies loss of appetite, chewing/swallowing difficulty, and recent wt loss. Pt reports following a consistent CHO diet at baseline. Tests BG at least daily at home and normally runs ~120 mg/dl. Pt with questions re: low fat diet. Provided full diet education using NCM handouts. Also answered appropriate questions r/t DM. Pt with good comprehension, expect good compliance with low fat diet. Handout left with pt at bedside. Denies additional nutrition-related questions/concerns at this time.     Harl Favor, RD  Clinical Dietitian  Ext. [phone]

## 2017-11-12 NOTE — Plan of Care (Signed)
Problem: Altered GI Function  Goal: Elimination patterns are normal or improving  Outcome: Progressing   11/12/17 1726   Goal/Interventions addressed this shift   Elimination patterns are normal or improving Report abnormal assessment to physician;Anticipate/assist with toileting needs;Monitor for abdominal distension   Transitioned from full liquid to low fat diet. Tolerated well.  Denies n/v, constipation or diarrhea. Denies abdominal pain.   Kidney fx's improved today.   D/c IVF during trio rounding. VSS.

## 2017-11-12 NOTE — Plan of Care (Signed)
Problem: Pain  Goal: Pain at adequate level as identified by patient  Outcome: Progressing   11/12/17 2343   Goal/Interventions addressed this shift   Pain at adequate level as identified by patient Identify patient comfort function goal;Assess for risk of opioid induced respiratory depression, including snoring/sleep apnea. Alert healthcare team of risk factors identified.;Assess pain on admission, during daily assessment and/or before any "as needed" intervention(s);Reassess pain within 30-60 minutes of any procedure/intervention, per Pain Assessment, Intervention, Reassessment (AIR) Cycle;Evaluate if patient comfort function goal is met     Patient alert and oriented x 4, daughter at bedside.  Denies nausea, vomiting and pain.  Tolerating low fat diet well.  Low fall risk, independent with hygiene.  VSS.  Discharge planning initiated.  Call bell within reach.  Will continue to monitor.     Problem: Altered GI Function  Goal: Nutritional intake is adequate  Outcome: Progressing

## 2017-11-12 NOTE — Progress Notes (Signed)
[facility]  HOSPITALIST  PROGRESS NOTE      Patient: [name] [name]  Date: 11/12/2017   LOS: 3 Days  Admission Date: 11/09/2017   MRN: [id number]  Attending: Krystal Eaton MD     ASSESSMENT/PLAN     [name] [name] is a 77 y.o. female admitted with     *ruq pain/n/v: due to mildly distended gallbladder containing stones/sludge, possible Acute Choleycystitis  -Zosyn- renally dosed  -MRCP noted, does have dilated CBD at 1 cm, no definitive CBD stones identified  -surg on board, conservative treatment and if failed cholecystostomy tube is recommended.  So far patient has improved with resolution of pain, denies nausea vomiting and tolerating diet which has been advanced to low-fat today  -continue antibiotics  -GMA input appreciated  -Hopefully discharge by tomorrow?      *RCC with pulmonary and bony mets  -s/p Removal of Left Kidney/Adrenal Gland ~15 months ago  - Current Chemo Regimen includes Xgeva-due this week and Sutent- this is her off week  -Follows with [facility] in [place]    *Electrolyte Abnormality  -secondary to poor po intake and vomiting  - Hypokalemia, hypomagnesemia, replete as needed    * AKI on CKD Stage 3, in setting of one kidney, states several weeks ago a creatinine is 1.7.  2 months ago it was around 1.5  -Creat improved 3.1-->2.2>1.8  -Avoid nephrotoxins  -Consulted Dr. Stacy Gardner.  Appreciate input,    * HTN  -Continue home regimen  -PRN hydralazine     * Hypothyroidism  -Continue home dose of synthroid    * HLD  -Continue statin    *NIDDM  -Pt not currently taking medications as she was having difficulty with hypoglycemia  -Will check BS with meals.        Analgesia: prn    Nutrition: low Fat    Safety Checklist  DVT prophylaxis: Mechanical   Foley: Not present   IVs:  Peripheral IV   PT/OT: Not needed           Patient Lines/Drains/Airways Status    Active PICC Line / CVC Line / PIV Line / Drain / Airway / Intraosseous Line / Epidural Line / ART Line / Line / Wound / Pressure Ulcer / NG/OG  Tube     Name:   Placement date:   Placement time:   Site:   Days:    Peripheral IV 11/09/17 Left Antecubital  11/09/17    1221    Antecubital    less than 1                     Code Status: full code    DISPO: home with family    Family Contact: in room    Care Plan discussed with nursing, consultants, case manager.       SUBJECTIVE     [name] [name] states she is doing well overall, no vomiting or nausea.  Denies right upper quadrant abdominal pain.  No fevers.  Daughter in the room and updated.  Emulating in the room, denies dizziness,  shortness of breath    MEDICATIONS     Current Facility-Administered Medications   Medication Dose Route Frequency   . amLODIPine  5 mg Oral Daily   . atorvastatin  40 mg Oral QPM   . carvedilol  6.25 mg Oral Q12H SCH   . hydrocortisone  10 mg Oral BID   . levothyroxine  50 mcg Oral Daily at 0600   .  pantoprazole  40 mg Oral QHS   . piperacillin-tazobactam  2.25 g Intravenous Q8H       ROS     Remainder of 10 point ROS as above or otherwise negative    PHYSICAL EXAM     Vitals:    11/12/17 0919   BP: 148/75   Pulse: (!) 59   Resp:    Temp:    SpO2:        Temperature: Temp  Min: 97.9 F (36.6 C)  Max: 98.6 F (37 C)  Pulse: Pulse  Min: 55  Max: 64  Respiratory: Resp  Min: 17  Max: 17  Non-Invasive BP: BP  Min: 143/75  Max: 158/71  Pulse Oximetry SpO2  Min: 98 %  Max: 98 %    Intake and Output Summary (Last 24 hours) at Date Time    Intake/Output Summary (Last 24 hours) at 11/12/17 1328  Last data filed at 11/12/17 0505   Gross per 24 hour   Intake             1841 ml   Output                0 ml   Net             1841 ml       GEN APPEARANCE: Normal;  A&OX3, elderly, frail and pale  HEENT: EOMI; anicteric sclera  NECK: Supple   CVS: RRR, S1, S2; No M/G/R  LUNGS: CTAB; No Wheezes; No Rhonchi: No rales  ABD: Soft; no sig TTP; + Normoactive BS, no R/G  EXT: No edema;   SKIN: No rash or Lesions  NEURO:  No gross focal neurological deficits      LABS       Recent Labs  Lab 11/12/17  0608  11/11/17  0633 11/10/17  0630   WBC 6.36 8.64 10.10*   RBC 3.19* 3.47* 3.31*   Hgb 10.5* 11.7 11.0*   Hematocrit 32.0* 36.1 33.0*   MCV 100.3* 104.0* 99.7*   Platelets 295 314 346         Recent Labs  Lab 11/12/17  0608 11/11/17  0633 11/10/17  0630 11/10/17  0039 11/09/17  1958 11/09/17  1223   Sodium 135* 133* 131*  --  132* 132*   Potassium 3.4* 3.4* 3.5 3.6 2.4* 2.9*   Chloride 108 103 99*  --  97* 92*   CO2 16* 17* 17*  --  18* 21*   BUN 24* 33* 46*  --  49* 52*   Creatinine 1.8* 2.2* 2.5*  --  2.6* 3.1*   Glucose 136* 140* 162*  --  128* 143*   Calcium 6.2* 6.5* 6.9*  --  6.8* 7.5*   Magnesium  --   --  2.5 1.0*  --   --          Recent Labs  Lab 11/10/17  0630 11/09/17  1223   ALT 9 17   AST (SGOT) 21 22   Bilirubin, Total 0.4 0.4   Bilirubin, Direct  --  0.2   Albumin 3.1* 3.8   Alkaline Phosphatase 74 90                   Microbiology Results     Procedure Component Value Units Date/Time    Adult Urine culture [[id number]] Collected:  11/09/17 1605    Specimen:  Urine from Urine, Clean Catch Updated:  11/10/17 0053    Narrative:  Replace urinary catheter prior to obtaining the urine culture  if it has been in place for greater than or equal to 14  days:->N/A No Foley  Indications for Urine Culture:->Other (please specify in  Comments)           RADIOLOGY     Radiological Procedure personally reviewed and concur with radiologist reports unless stated otherwise.    MRI Abdomen WO Contrast MRCP   Final Result         1. Mildly distended gallbladder containing gallstones and sludge,   correlation for symptoms of acute cholecystitis is recommended.   2. Dilated common bile duct measuring 1 cm in diameter. No definite   common bile duct stones identified.   3. Small amount of ascites.      Georgiana Spinner, MD    11/10/2017 9:25 AM      US Abdomen Limited RUQ   Final Result      1. Gallstones and sludge in the gallbladder, with minimal wall   thickening and minimal pericholecystic fluid raising the possibility of    mild cholecystitis.   2. Moderate biliary dilatation to the level of the distal common duct.   The cause of dilatation is not identified on this study, and could be   further evaluated by MRCP.   3. 1.1 cm hypoechoic lesion in the body of the pancreas which is   indeterminate. Recommend further evaluation with contrast-enhanced MRI.      COMMENT: This urgent result was discussed with and acknowledged by Dr.   Ladona Ridgel, at 3:40 PM hours on 11/09/2017.      Lanier Ensign, MD    11/09/2017 3:41 PM          Signed,  Krystal Eaton, MD  1:28 PM 11/12/2017

## 2017-11-13 LAB — BASIC METABOLIC PANEL
Anion Gap: 12 (ref 5.0–15.0)
BUN: 18 mg/dL (ref 7–19)
CO2: 15 mEq/L — ABNORMAL LOW (ref 22–29)
Calcium: 6.2 mg/dL — ABNORMAL LOW (ref 7.9–10.2)
Chloride: 109 mEq/L (ref 100–111)
Creatinine: 1.6 mg/dL — ABNORMAL HIGH (ref 0.6–1.0)
Glucose: 128 mg/dL — ABNORMAL HIGH (ref 70–100)
Potassium: 3.8 mEq/L (ref 3.5–5.1)
Sodium: 136 mEq/L (ref 136–145)

## 2017-11-13 LAB — GFR: EGFR: 31.4

## 2017-11-13 MED ORDER — METRONIDAZOLE 250 MG PO TABS
500.00 mg | ORAL_TABLET | Freq: Three times a day (TID) | ORAL | Status: DC
Start: 2017-11-13 — End: 2017-11-13

## 2017-11-13 MED ORDER — METRONIDAZOLE 500 MG PO TABS
500.00 mg | ORAL_TABLET | Freq: Three times a day (TID) | ORAL | 0 refills | Status: DC
Start: 2017-11-13 — End: 2017-11-13

## 2017-11-13 MED ORDER — CEFUROXIME AXETIL 250 MG PO TABS
250.00 mg | ORAL_TABLET | Freq: Two times a day (BID) | ORAL | Status: DC
Start: 2017-11-13 — End: 2017-11-13
  Administered 2017-11-13: 250 mg via ORAL
  Filled 2017-11-13: qty 1

## 2017-11-13 MED ORDER — METRONIDAZOLE 500 MG PO TABS
500.00 mg | ORAL_TABLET | Freq: Three times a day (TID) | ORAL | 0 refills | Status: AC
Start: 2017-11-13 — End: 2017-11-19

## 2017-11-13 MED ORDER — CEFUROXIME AXETIL 250 MG PO TABS
250.00 mg | ORAL_TABLET | Freq: Two times a day (BID) | ORAL | 0 refills | Status: DC
Start: 2017-11-13 — End: 2017-11-13

## 2017-11-13 MED ORDER — CEFUROXIME AXETIL 250 MG PO TABS
250.00 mg | ORAL_TABLET | Freq: Two times a day (BID) | ORAL | 0 refills | Status: AC
Start: 2017-11-13 — End: 2017-11-19

## 2017-11-13 NOTE — Discharge Instructions (Signed)
Metronidazole tablets or capsules  Brand Name: Flagyl  What is this medicine?  METRONIDAZOLE (me troe NI da zole) is an antiinfective. It is used to treat certain kinds of bacterial and protozoal infections. It will not work for colds, flu, or other viral infections.  How should I use this medicine?  Take this medicine by mouth with a full glass of water. Follow the directions on the prescription label. Take your medicine at regular intervals. Do not take your medicine more often than directed. Take all of your medicine as directed even if you think you are better. Do not skip doses or stop your medicine early.  Talk to your pediatrician regarding the use of this medicine in children. Special care may be needed.  What side effects may I notice from receiving this medicine?  Side effects that you should report to your doctor or health care professional as soon as possible:   allergic reactions like skin rash or hives, swelling of the face, lips, or tongue   confusion   fast, irregular heartbeat   fever, chills, sore throat   fever with rash, swollen lymph nodes, or swelling of the face   pain, tingling, numbness in the hands or feet   redness, blistering, peeling or loosening of the skin, including inside the mouth   seizures   sign and symptoms of liver injury like dark yellow or brown urine; general ill feeling or flu-like symptoms; light colored stools; loss of appetite; nausea; right upper belly pain; unusually weak or tired; yellowing of the eyes or skin   vaginal discharge, itching, or odor in women  Side effects that usually do not require medical attention (report to your doctor or health care professional if they continue or are bothersome):   changes in taste   diarrhea   headache   nausea, vomiting   stomach pain  What may interact with this medicine?  Do not take this medicine with any of the following medications:   alcohol or any product that contains  alcohol   cisapride   disulfiram   dofetilide   dronedarone   pimozide   thioridazine   ziprasidone  This medicine may also interact with the following medications:   amiodarone   birth control pills   busulfan   carbamazepine   cimetidine   cyclosporine   fluorouracil   lithium   other medicines that prolong the QT interval (cause an abnormal heart rhythm)   phenobarbital   phenytoin   quinidine   tacrolimus   vecuronium   warfarin  What if I miss a dose?  If you miss a dose, take it as soon as you can. If it is almost time for your next dose, take only that dose. Do not take double or extra doses.  Where should I keep my medicine?  Keep out of the reach of children.  Store at room temperature below 25 degrees C (77 degrees F). Protect from light. Keep container tightly closed. Throw away any unused medicine after the expiration date.  What should I tell my health care provider before I take this medicine?  They need to know if you have any of these conditions:   Cockayne syndrome   history of blood diseases, like sickle cell anemia or leukemia   history of yeast infection   if you often drink alcohol   liver disease   an unusual or allergic reaction to metronidazole, nitroimidazoles, or other medicines, foods, dyes, or preservatives   pregnant   or trying to get pregnant   breast-feeding  What should I watch for while using this medicine?  Tell your doctor or health care professional if your symptoms do not improve or if they get worse.  You may get drowsy or dizzy. Do not drive, use machinery, or do anything that needs mental alertness until you know how this medicine affects you. Do not stand or sit up quickly, especially if you are an older patient. This reduces the risk of dizzy or fainting spells.  Ask your doctor or health care professional if you should avoid alcohol. Many nonprescription cough and cold products contain alcohol. Metronidazole can cause an unpleasant reaction when  taken with alcohol. The reaction includes flushing, headache, nausea, vomiting, sweating, and increased thirst. The reaction can last from 30 minutes to several hours.  If you are being treated for a sexually transmitted disease, avoid sexual contact until you have finished your treatment. Your sexual partner may also need treatment.  NOTE:This sheet is a summary. It may not cover all possible information. If you have questions about this medicine, talk to your doctor, pharmacist, or health care provider. Copyright 2019 [employer]        Cefuroxime tablets  Brand Names: Alti-Cefuroxime, Ceftin  What is this medicine?  CEFUROXIME (se fyoor OX eem) is a cephalosporin antibiotic. It is used to treat certain kinds of bacterial infections. It will not work for colds, flu, or other viral infections.  How should I use this medicine?  Take this medicine by mouth with a full glass of water. Follow the directions on the prescription label. Do not crush or chew. This medicine works best if you take it with food. Take your medicine at regular intervals. Do not take your medicine more often than directed. Take all of your medicine as directed even if you think your are better. Do not skip doses or stop your medicine early.  Talk to your pediatrician regarding the use of this medicine in children. Special care may be needed. While this drug may be prescribed for children as young as 3 months of age for selected conditions, precautions do apply.  What side effects may I notice from receiving this medicine?  Side effects that you should report to your doctor or health care professional as soon as possible:   allergic reactions like skin rash, itching or hives, swelling of the face, lips, or tongue   dark urine   difficulty breathing   fever   irregular heartbeat or chest pain   redness, blistering, peeling or loosening of the skin, including inside the mouth   seizures   unusual bleeding or bruising   unusually weak or  tired   white patches or sores in the mouth  Side effects that usually do not require medical attention (report to your doctor or health care professional if they continue or are bothersome):   diarrhea   gas or heartburn   headache   nausea, vomiting   vaginal itching  What may interact with this medicine?  This medicine may interact with the following medications:   antacids   birth control pills   certain medicines for infection like amikacin, gentamicin, tobramycin   diuretics   probenecid   warfarin  What if I miss a dose?  If you miss a dose, take it as soon as you can. If it is almost time for your next dose, take only that dose. Do not take double or extra doses.  Where should I   keep my medicine?  Keep out of the reach of children.  Store at room temperature between 15 and 30 degrees C (59 and 86 degrees F). Keep container tightly closed. Protect from moisture. Throw away any unused medicine after the expiration date.  What should I tell my health care provider before I take this medicine?  They need to know if you have any of these conditions:   bleeding problems   bowel disease, like colitis   kidney disease   liver disease   an unusual or allergic reaction to cefuroxime, other antibiotics or medicines, foods, dyes or preservatives   pregnant or trying to get pregnant   breast-feeding  What should I watch for while using this medicine?  Tell your doctor or health care professional if your symptoms do not improve or if you get new symptoms.  Do not treat diarrhea with over the counter products. Contact your doctor if you have diarrhea that lasts more than 2 days or if it is severe and watery.  This medicine can interfere with some urine glucose tests. If you use such tests, talk with your health care professional.  If you are being treated for a sexually transmitted disease, avoid sexual contact until you have finished your treatment. Your sexual partner may also need  treatment.  NOTE:This sheet is a summary. It may not cover all possible information. If you have questions about this medicine, talk to your doctor, pharmacist, or health care provider. Copyright 2019 [employer]

## 2017-11-13 NOTE — Discharge Instr - AVS First Page (Addendum)
Reason for your Hospital Admission:  Kidney dysfunction, gallbladder stone/infection  Cholecystitis (Presumed)    Your abdominal pain may be due to an inflammation and possible infection in the gallbladder. This is called cholecystitis. The gallbladder is a small sac under the liver, which stores and releases bile. Bile is a fluidmade in the liverthat aids in the digestion of fat. Eating fatty food stimulates the gallbladder to contract, and release the bile.Gallstonesmay form in this sac(called cholelithiasis). Although most people do not have symptoms,ifthe stone moves and blocks the passage of bile out of the bladder, it can cause pain and even an infection.The infection is called cholecystitis.Bile sludge without a stone can also cause cholecystitis.  To be more certain of the diagnosis, you may need to have an ultrasound, CT scan or other special test.  A number of things increase the risk of developing gallstones:   Women are more likely to have the problem   Obesity   Increasing age   Losing or gaining weight quickly   High calorie diet   Pregnancy   Hormone therapy   Diabetes  The most common symptoms are:   Abdominal pain, cramping, aching   Nausea, vomiting   Fever  Many illnesses can cause these symptoms. This pain usually starts in the upper right side of your abdomen. Sometimes it can radiate to your right shoulder, back and arm. It usually starts suddenly, becomes more intense quickly, and then gradually decreases and disappears over a couple of hours. Elderly people and diabetics may have difficulty showing where the pain is exactly.The pain may occur after meals, particularly fatty meals.  Home care   Rest in bed and follow a clear liquid diet until the pain, nausea and vomiting go away.   Antibiotics and other medicine may be prescribed. Take these exactly as directed.   You can take acetaminophen or ibuprofen for pain, unless you were given a different pain medicine to  use. Note: If you have chronic liver or kidney disease or ever had a stomach ulcer or GI bleeding or are taking blood thinner medicines, talk with your healthcare provider before using these medicines.   Fat in your diet makes the gallbladder contract and may cause increased pain. Therefore, avoid fat in your diet over the next two days and follow a low-fat diet thereafter. If you are overweight, a low-fat diet will help you lose weight.  Follow-up care  An infection in the gallbladder is a serious problem and must be watched carefully. Keep any appointments made for you to have further testing. See your healthcare provider for another exam in the next 1-2 days, or as directed by our staff. Once cholecystitis has occurred, removal of the gallbladder is usually required to prevent a recurrence. You can discuss this at your follow-up visit.If you were hospitalized for cholecystitis, your gallbladder may be removed during that same hospital stay. Gallstones that are not causing infection or symptoms often do not need surgery.  When to seek medical advice  Call your healthcare provider if any of the following occur:   Repeated vomiting   Swelling of the abdomen   Pain lasting over 6 hours   Fever of 100.57F (38C) or higher, or as directed by your healthcare provider   Weakness, dizziness, or fainting   Dark urine or light colored stools   Yellow color of the skin or eyes   Chest, arm, back, neck, or jaw pain  Date Last Reviewed: 12/22/2015   2000-2019 [employer]. [address] [place], [place] [address]. All rights reserved. This information is not intended as a substitute for professional medical care. Always follow your healthcare professional's instructions.          Instructions for after your discharge:  As instructed by MD

## 2017-11-13 NOTE — Plan of Care (Signed)
Problem: Discharge Barriers  Goal: Patient will be discharged home or other facility with appropriate resources  Outcome: Progressing   11/13/17 1213   Goal/Interventions addressed this shift   Discharge to home or other facility with appropriate resources Provide appropriate patient education;Provide information on available health resources;Initiate discharge planning   Discharged instructions, prescriptions for ceftin and flagyl given to Pt.  Pt instructed to follow up with PCP after discharged. Pt verbalized understanding of discharged instructions. Pt waiting for her daughter to pick her up.

## 2017-11-13 NOTE — Progress Notes (Signed)
PROGRESS NOTE    Date Time: 11/13/17 12:39 PM  Patient Name: [name] [name]                                NEPHROLOGY PROGRESS NOTE    Follow up for Acute renal failure  Subjective:   Patient states she is doing well today    Medications:      Scheduled Meds: PRN Meds:      amLODIPine 5 mg Oral Daily   atorvastatin 40 mg Oral QPM   calcium carbonate 500 mg Oral BID   carvedilol 6.25 mg Oral Q12H SCH   cefuroxime 250 mg Oral Q12H SCH   hydrocortisone 10 mg Oral BID   levothyroxine 50 mcg Oral Daily at 0600   metroNIDAZOLE 500 mg Oral Q8H SCH   pantoprazole 40 mg Oral QHS       Continuous Infusions:     acetaminophen 650 mg Q6H PRN   clobetasol  BID PRN   hydrALAZINE 10 mg Q6H PRN   naloxone 0.2 mg PRN   ondansetron 4 mg Q6H PRN   Or     ondansetron 4 mg Q6H PRN   prochlorperazine 10 mg Q6H PRN   traMADol 50 mg Q6H PRN         Review of Systems:    No complaints of chest pain, palpitations shortness of breath, cough, headaches, dizziness, joint pain, abdominal pain, nausea, decreased appetite, or fevers.     Physical Exam:   Patient Vitals for the past 24 hrs:   BP Temp Temp src Pulse Resp SpO2   11/13/17 0902 166/78 - - 68 - -   11/13/17 0523 163/79 97.8 F (36.6 C) Oral 60 16 98 %   11/12/17 2210 147/70 - - 63 - -   11/12/17 2022 152/83 97.9 F (36.6 C) Oral 61 16 98 %   11/12/17 1508 155/82 98.1 F (36.7 C) Oral 68 18 98 %       Intake and Output Summary (Last 24 hours) at Date Time    Intake/Output Summary (Last 24 hours) at 11/13/17 1239  Last data filed at 11/13/17 0527   Gross per 24 hour   Intake             1164 ml   Output                0 ml   Net             1164 ml       General appearance - alert, well appearing, and in no distress  Chest - clear to auscultation, no wheezes, rales or rhonchi, symmetric air entry  Heart - S1 and S2 normal  Abdomen - soft  Neurological - soft, normal speech  Extremities - peripheral pulses normal, no pedal edema, no clubbing or cyanosis    Labs:     Recent Labs  Lab 11/12/17   0608 11/11/17  0633 11/10/17  0630   WBC 6.36 8.64 10.10*   RBC 3.19* 3.47* 3.31*   Hgb 10.5* 11.7 11.0*   Hematocrit 32.0* 36.1 33.0*   MCV 100.3* 104.0* 99.7*   MCHC 32.8 32.4 33.3   RDW 15 15 15    MPV 9.4 9.5 9.7   Platelets 295 314 346           Recent Labs  Lab 11/13/17  0700 11/12/17  0608 11/11/17  0633 11/10/17  0630 11/10/17  7322 11/09/17  1958   Sodium 136 135* 133* 131*  --  132*   Potassium 3.8 3.4* 3.4* 3.5 3.6 2.4*   Chloride 109 108 103 99*  --  97*   CO2 15* 16* 17* 17*  --  18*   BUN 18 24* 33* 46*  --  49*   Creatinine 1.6* 1.8* 2.2* 2.5*  --  2.6*   Glucose 128* 136* 140* 162*  --  128*   Calcium 6.2* 6.2* 6.5* 6.9*  --  6.8*   Magnesium  --   --   --  2.5 1.0*  --          Results     Procedure Component Value Units Date/Time    Basic Metabolic Panel [[id number]]  (Abnormal) Collected:  11/13/17 0700    Specimen:  Blood Updated:  11/13/17 0729     Glucose 128 (H) mg/dL      BUN 18 mg/dL      Creatinine 1.6 (H) mg/dL      Calcium 6.2 (L) mg/dL      Sodium 376 mEq/L      Potassium 3.8 mEq/L      Chloride 109 mEq/L      CO2 15 (L) mEq/L      Anion Gap 12.0    GFR [[id number]] Collected:  11/13/17 0700     Updated:  11/13/17 0729     EGFR 31.4    Glucose Whole Blood - POCT [[id number]]  (Abnormal) Collected:  11/12/17 1736     Updated:  11/12/17 1742     POCT - Glucose Whole blood 132 (H) mg/dL               Rads:   Radiological Procedure reviewed.    Signed by: Henrine Screws, MD    Assessment:   1) Renal: AKI, related to pre-renal. IMproving. Encourage PO fluid intake    2) Hypocalcemia: Continue Tums 1 tab bid    Plan:   Encourage PO fluid intake  Continue tums on discharge      Henrine Screws, MD  [facility]  Spectra# [phone]  Pg# [phone]  Office # [phone]  After 430 PM, please call the office for person on call  On Weekends, call the office for the person on call

## 2017-11-13 NOTE — Discharge Summary (Signed)
[facility]      Patient: [name] [name]  Admission Date: 11/09/2017   DOB: 07-13-1940  Discharge Date: 11/13/2017    MRN: [id number]  Discharge Attending: Krystal Eaton MD   Referring Physician: Bobby Rumpf, MD  PCP: Bobby Rumpf, MD       DISCHARGE SUMMARY     Discharge Information   Admission Diagnosis:   Gallstones with possible acute cholecystitis    Discharge Diagnosis:   Patient Active Problem List    Diagnosis Date Noted   . Dehydration 11/09/2017   Possible acute cholecystitis, acute kidney injury on CKD stage III, renal cell carcinoma with metastases, hypertension, hypothyroidism, hyperlipidemia, hypokalemia/hypomagnesemia,niddm    Admission Condition: fair  Discharge Condition: stable  Functional Status: Independent    Discharge Medications:     Medication List      START taking these medications    cefuroxime 250 MG tablet  Commonly known as:  CEFTIN  Take 1 tablet (250 mg total) by mouth every 12 (twelve) hours for 6 days     metroNIDAZOLE 500 MG tablet  Commonly known as:  FLAGYL  Take 1 tablet (500 mg total) by mouth every 8 (eight) hours for 6 days        CONTINUE taking these medications    amLODIPine 5 MG tablet  Commonly known as:  NORVASC     atorvastatin 40 MG tablet  Commonly known as:  LIPITOR     Calcium 600-200 MG-UNIT per tablet     carvedilol 6.25 MG tablet  Commonly known as:  COREG     hydrocortisone 5 MG tablet  Commonly known as:  CORTEF     levothyroxine 50 MCG tablet  Commonly known as:  SYNTHROID, LEVOTHROID     pantoprazole 20 MG tablet  Commonly known as:  PROTONIX     prochlorperazine 10 MG tablet  Commonly known as:  COMPAZINE     SUNItinib 50 MG capsule  Commonly known as:  SUTENT     traMADol 50 MG tablet  Commonly known as:  ULTRAM        STOP taking these medications    XGEVA SC           Where to Get Your Medications      You can get these medications from any pharmacy    Bring a paper prescription for each of these medications   cefuroxime 250  MG tablet   metroNIDAZOLE 500 MG tablet             Hospital Course   Presentation History   Carlinda Ohlson is a 77 y.o. female with a PMHx of renal cell carcinoma with pulmonary, bony and adrenal mets-s/p Left nephrectomy and left adrenal gland removal on Chemo, HLD, HTN, hypothyroidism, DM II who presented with complaints of vomiting. She reports a one week history of decreased appetite, recurrent RUQ pain and 24-48 hours of vomiting. 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  She has a known history of gallstones per a PET Scan on 10/16/2017 which showed gallstones with gallbladder wall thickening. It was recommended to her at that time that see surgery for a cholecystectomy.     See HPI for details.    Hospital Course (4 Days)     *ruq pain/n/v: due to mildly distended gallbladder containing stones/sludge, possible Acute Choleycystitis  -She was admitted to the hospital service and started on renally dose Zosyn and surgery and gastroenterology were consulted.  She underwent MRCP showing dilated CBD  at 1 cm, no definitive CBD stones identified  -surg on board, conservative treatment and if failed cholecystostomy tube is recommended.  So far patient has improved with resolution of pain, denies nausea vomiting and tolerating diet which has been advanced to low-fat  -continued antibiotics and at this time will transition to oral for additional 6 days  -      *RCC with pulmonary and bony mets  -s/p Removal of Left Kidney/Adrenal Gland ~15 months ago  - Current Chemo Regimen includes Xgeva-due this week and Sutent- this is her off week  -Follows with [facility] in [place]    *Electrolyte Abnormality  -secondary to poor po intake and vomiting  - Hypokalemia, hypomagnesemia, repleted as needed    * AKI on CKD Stage 3, in setting of one kidney, states several weeks ago a creatinine is 1.7.  2 months ago it was around 1.5  -Creat improved 3.1-->2.2>1.8>1.6  -Avoid nephrotoxins  -Consulted Dr. Stacy Gardner.   Appreciate input,  -She is aware needs to follow-up with nephrology as an outpatient    * HTN  -Continue home regimen      * Hypothyroidism  -Continue home dose of synthroid    * HLD  -Continue statin    *NIDDM  -Pt not currently taking medications as she was having difficulty with hypoglycemia  -Will check BS with meals.  Disposition: Home with family  Procedures/Imaging:   MRI Abdomen WO Contrast MRCP   Final Result         1. Mildly distended gallbladder containing gallstones and sludge,   correlation for symptoms of acute cholecystitis is recommended.   2. Dilated common bile duct measuring 1 cm in diameter. No definite   common bile duct stones identified.   3. Small amount of ascites.      Georgiana Spinner, MD    11/10/2017 9:25 AM      US Abdomen Limited RUQ   Final Result      1. Gallstones and sludge in the gallbladder, with minimal wall   thickening and minimal pericholecystic fluid raising the possibility of   mild cholecystitis.   2. Moderate biliary dilatation to the level of the distal common duct.   The cause of dilatation is not identified on this study, and could be   further evaluated by MRCP.   3. 1.1 cm hypoechoic lesion in the body of the pancreas which is   indeterminate. Recommend further evaluation with contrast-enhanced MRI.      COMMENT: This urgent result was discussed with and acknowledged by Dr.   Ladona Ridgel, at 3:40 PM hours on 11/09/2017.      Lanier Ensign, MD    11/09/2017 3:41 PM          Treatment Team:   Attending Provider: Krystal Eaton, MD  Consulting Physician: Lianne Moris, MD  Consulting Physician: Henrine Screws, MD       Best Practices   Was the patient admitted with either a CHF Exacerbation or Pneumonia? No     Progress Note/Physical Exam at Discharge     Subjective: Diet, denies nausea vomiting or abdominal pain, ambulating without dizziness, happy that kidney function is improving,    Vitals:    11/12/17 2022 11/12/17 2210 11/13/17 0523 11/13/17 0902   BP: 152/83  147/70 163/79 166/78   Pulse: 61 63 60 68   Resp: 16  16    Temp: 97.9 F (36.6 C)  97.8 F (36.6 C)    TempSrc:  Oral  Oral    SpO2: 98%  98%    Weight:       Height:           General: NAD, AAOx3  HEENT: eomi, sclera anicteric, OP: Clear, MMM  Neck: supple,   Cardiovascular: RRR, no m/r/g  Lungs: CTAB,   Abdomen: soft, +BS, NT/ND, no masses, no g/r  Extremities: no edema  Skin: no rashes or lesions noted  Neuro:  No Focal neurological deficits       Diagnostics     Labs/Studies Pending at Discharge: No    Last Labs     Recent Labs  Lab 11/12/17  0608 11/11/17  0633 11/10/17  0630   WBC 6.36 8.64 10.10*   RBC 3.19* 3.47* 3.31*   Hgb 10.5* 11.7 11.0*   Hematocrit 32.0* 36.1 33.0*   MCV 100.3* 104.0* 99.7*   Platelets 295 314 346         Recent Labs  Lab 11/13/17  0700 11/12/17  0608 11/11/17  0633 11/10/17  0630 11/10/17  0039 11/09/17  1958   Sodium 136 135* 133* 131*  --  132*   Potassium 3.8 3.4* 3.4* 3.5 3.6 2.4*   Chloride 109 108 103 99*  --  97*   CO2 15* 16* 17* 17*  --  18*   BUN 18 24* 33* 46*  --  49*   Creatinine 1.6* 1.8* 2.2* 2.5*  --  2.6*   Glucose 128* 136* 140* 162*  --  128*   Calcium 6.2* 6.2* 6.5* 6.9*  --  6.8*   Magnesium  --   --   --  2.5 1.0*  --        Microbiology Results     Procedure Component Value Units Date/Time    Adult Urine culture [[id number]] Collected:  11/09/17 1605    Specimen:  Urine from Urine, Clean Catch Updated:  11/11/17 4098    Narrative:       Replace urinary catheter prior to obtaining the urine culture  if it has been in place for greater than or equal to 14  days:->N/A No Foley  Indications for Urine Culture:->Other (please specify in  Comments)  ORDER#: [id number]                                    ORDERED BY: [name]  SOURCE: Urine, Clean Catch                           COLLECTED:  11/09/17 16:05  ANTIBIOTICS AT COLL.:                                RECEIVED :  11/10/17 00:53  Culture Urine                              FINAL       11/11/17 07:28  11/11/17    50,000 - 70,000 CFU/ML of normal urogenital or skin microbiota, no             further work             Patient Instructions   Discharge Diet: cardiac diet and low fat  Discharge Activity:  activity as tolerated    Follow  Up Appointment:  Follow-up Information     pcp Follow up in 1 week(s).    Why:  post hospital follow up, gallbladder, and kidney function                  Time spent examining patient, discussing with patient/family regarding hospital course, chart review, reconciling medications and discharge planning: 40 minutes.    Josie Saunders  MD  11:48 AM 11/13/2017

## 2017-11-14 ENCOUNTER — Telehealth: Payer: Self-pay | Admitting: Hematology

## 2017-11-14 NOTE — Telephone Encounter (Signed)
Called pt re appts that were added per 7/25 sch msg - left vm for pt re appts.

## 2017-11-18 NOTE — Progress Notes (Signed)
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 The patient's last visit with [name] was on 09/10/17, and saw my colleague Dr. Burr Medico on 10/21/17 in the interim with the most recent PET/CT results. She is accompanied today by her husband. The pt reports that she is doing well overall.   The pt reports that she is having some trouble with Sutent the second week after taking it. She did not take Sutent this past week as otherwise scheduled. She developed fatigue and loss of appetite after her previous dose, but notes that her symptoms could be from her other medical problems including her recently problematic gallbladder. The pt presented to the ED on 11/09/17 for dehydration and cholecystitis, and is considering a cholecystectomy. She finished her antibiotic course after discharge.   The pt has been taking Xgeva every four weeks since last May 2018 and has not had any new bone pains or concerns.   She continues on Hydrocortisone and maintains follow up with her endocrinologist.   Of note since the patient's last visit, pt has had PET/CT completed on 10/16/17 with results revealing Continued improvement, with the T7 metastatic lesion no longer significantly hypermetabolic. The previous right lower lobe pulmonary nodule is even less apparent, perhaps about 2 mm in diameter today; given that this measured 6 mm on 03/28/2017 this probably represents an effectively treated metastatic lesion. Currently no appreciable hypermetabolic activity is identified to suggest active  malignancy. Distended gallbladder with gallbladder wall thickening and gallstones. Correlate clinically in assessing for cholecystitis. Small but abnormal amount of free pelvic fluid, nonspecific. Other imaging findings of potential clinical significance: Chronic ethmoid sinusitis. Aortic Atherosclerosis. Stable 5 mm right middle lobe pulmonary nodule, not hypermetabolic but below sensitive PET-CT size thresholds. Left nephrectomy. Notable pelvic floor laxity with cystocele. Chronic bilateral Sacroiliitis..  Lab results today (11/19/17) of CBC w/diff, CMP, and Reticulocytes is as follows: all values are WNL except for RBC at 2.84, HGB at 9.4, HCT at 29.6, MCV at 104.2, RDW at 16.7, Lymphs abs at 800, Sodium at 133, CO2 at 20, Glucose at 175, Creatinine at 1.37, Calcium at 7.9, Total Protein at 5.8, Albumin at 3.2, AST at 13, Total Bilirubin at 0.2, GFR at 37. TSH 11/19/17 is WNL at 3.141  On review of systems, pt reports good energy levels, some mild leg swelling, recent RUQ abdominal discomfort, and denies current abdominal pain, bone pains, and any other symptoms.   MEDICAL HISTORY:    Hypertension Dyslipidemia Osteoarthritis Ex-smoker Coronary artery disease Thyroid disorder-was apparently on levothyroxine 25 g daily which has subsequently been discontinued . 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 Earley Brooke Colonoscopy 03/2016 Dr. Earley Brooke  SOCIAL HISTORY: Social History   Socioeconomic History  . Marital status: Married    Spouse name: Not on file  . Number of children: Not on file  . Years of education: Not on file  . Highest education level: Not on file  Occupational History  . Not on file  Social Needs  . Financial resource strain: Not on file  . Food insecurity:    Worry: Not on file    Inability: Not on file  .  Transportation needs:    Medical: Not on file    Non-medical: Not on file  Tobacco Use  . Smoking status: Former Smoker    Years: 8.00    Last attempt to quit: 06/18/1964    Years since quitting: 53.4  . Smokeless tobacco: Never Used  Substance and Sexual Activity  . Alcohol use: No  . Drug use: No  . Sexual activity: Not on file  Lifestyle  . Physical activity:    Days per week: Not on file    Minutes per session: Not on file  . Stress: Not on file  Relationships  . Social connections:    Talks on phone: Not on file    Gets together: Not on file    Attends religious service: Not on file    Active member of club or organization: Not on file    Attends meetings of clubs or organizations: Not on file    Relationship status: Not on file  . Intimate partner violence:    Fear of current or ex partner: Not on file    Emotionally abused: Not on file    Physically abused: Not on file    Forced sexual activity: Not on file  Other Topics Concern  . Not on file  Social History Narrative  . Not on file   Patient lives in [place] Works as a Solicitor part-time Ex-smoker quit long time ago In 1961 . Smoked 2 packs per day for 8 years prior to that .  or a lot of stress since her daughter is also getting treated for stage IV uterine cancer.  FAMILY HISTORY:  Mother deceased Father with asthma and emphysema died at 82 years with the MI. Brother deceased of heart disease   ALLERGIES:  is allergic to adhesive [tape]; nsaids; and statins.  MEDICATIONS:  Current Outpatient Medications  Medication Sig Dispense Refill  . acetaminophen (TYLENOL) 500 MG tablet Take 1,000 mg by mouth 2 (two) times daily.     Marland Kitchen amLODipine (NORVASC) 5 MG tablet Take 5 mg by mouth daily.  6  . atorvastatin (LIPITOR) 40 MG tablet Take 40 mg by mouth daily at 6 PM.     . carvedilol (COREG) 6.25 MG tablet Take 6.25 mg by mouth 2 (two) times daily.  6  . clobetasol ointment (TEMOVATE) 5.17 %  Apply 1 application topically 2 (two) times daily.    . furosemide (LASIX) 20 MG tablet Take 20 mg by mouth daily.    . hydrocortisone (CORTEF) 10 MG tablet Take 10 mg by mouth daily.    Marland Kitchen levothyroxine (SYNTHROID, LEVOTHROID) 25 MCG tablet Take 25 mcg by mouth daily before breakfast.    . magic mouthwash w/lidocaine SOLN Take 5 mLs by mouth 4 (four) times daily as needed for mouth pain. Swish and spit. (1 Part viscous lidocaine 2% 1 Part Maalox 1 Part diphenhydramine 12.5 mg per 79mL elixir) 120 mL 1  . ondansetron (ZOFRAN) 4 MG tablet Take 1 tablet (4 mg total) by mouth every 8 (eight) hours as needed for nausea. 30 tablet 2  . pantoprazole (PROTONIX) 20 MG tablet Take 20 mg by mouth daily.    . prochlorperazine (COMPAZINE) 10 MG tablet TAKE 1 TABLET BY MOUTH EVERY 6 HOURS AS NEEDED  FOR NAUSEA OR VOMITING 30 tablet 2  . SUNItinib (SUTENT) 37.5 MG capsule 37.5 mg po daily 2 weeks on 1 week off 28 capsule 3   No current facility-administered medications for this visit.    Facility-Administered Medications Ordered in Other Visits  Medication Dose Route Frequency Provider Last Rate Last Dose  . denosumab (XGEVA) injection 120 mg  120 mg Subcutaneous Once Brunetta Genera, MD        REVIEW OF SYSTEMS:    A 10+ POINT REVIEW OF SYSTEMS WAS OBTAINED including neurology, dermatology, psychiatry, cardiac, respiratory, lymph, extremities, GI, GU, Musculoskeletal, constitutional, breasts, reproductive, HEENT.  All pertinent positives are noted in the HPI.  All others are negative.    PHYSICAL EXAMINATION:  ECOG PERFORMANCE STATUS: 1 - Symptomatic but completely ambulatory  . Vitals:   11/19/17 1148  BP: (!) 155/72  Pulse: 69  Resp: 17  Temp: 97.9 F (36.6 C)  SpO2: 100%   Filed Weights   11/19/17 1148  Weight: 139 lb (63 kg)   .Body mass index is 28.07 kg/m.   GENERAL:alert, in no acute distress and comfortable SKIN: no acute rashes, no significant lesions EYES: conjunctiva are  pink and non-injected, sclera anicteric OROPHARYNX: MMM, no exudates, no oropharyngeal erythema or ulceration NECK: supple, no JVD LYMPH:  no palpable lymphadenopathy in the cervical, axillary or inguinal regions LUNGS: clear to auscultation b/l with normal respiratory effort HEART: regular rate & rhythm ABDOMEN:  normoactive bowel sounds , non tender, not distended. No palpable hepatosplenomegaly.  Extremity: no pedal edema PSYCH: alert & oriented x 3 with fluent speech NEURO: no focal motor/sensory deficits   LABORATORY DATA:  I have reviewed the data as listed  . CBC Latest Ref Rng & Units 11/19/2017 10/24/2017 09/10/2017  WBC 3.9 - 10.3 K/uL 6.9 5.1 5.1  Hemoglobin 11.6 - 15.9 g/dL 9.4(L) 10.4(L) 10.7(L)  Hematocrit 34.8 - 46.6 % 29.6(L) 30.9(L) 33.7(L)  Platelets 145 - 400 K/uL 229 264 216    CBC    Component Value Date/Time   WBC 6.9 11/19/2017 1042   WBC 5.1 09/10/2017 0837   RBC 2.84 (L) 11/19/2017 1042   RBC 2.84 (L) 11/19/2017 1042   HGB 9.4 (L) 11/19/2017 1042   HGB 11.2 (L) 04/04/2017 0830   HCT 29.6 (L) 11/19/2017 1042   HCT 35.2 04/04/2017 0830   PLT 229 11/19/2017 1042   PLT 250 04/04/2017 0830   MCV 104.2 (H) 11/19/2017 1042   MCV 107.6 (H) 04/04/2017 0830   MCH 33.1 11/19/2017 1042   MCHC 31.8 11/19/2017 1042   RDW 16.7 (H) 11/19/2017 1042   RDW 14.0 04/04/2017 0830   LYMPHSABS 0.8 (L) 11/19/2017 1042   LYMPHSABS 1.6 04/04/2017 0830   MONOABS 0.5 11/19/2017 1042   MONOABS 0.4 04/04/2017 0830   EOSABS 0.0 11/19/2017 1042   EOSABS 0.1 04/04/2017 0830   BASOSABS 0.0 11/19/2017 1042   BASOSABS 0.0 04/04/2017 0830     . CMP Latest Ref Rng & Units 11/19/2017 10/24/2017 09/10/2017  Glucose 70 - 99 mg/dL 175(H) 138(H) 126  BUN 8 - 23 mg/dL 17 19 16   Creatinine 0.44 - 1.00 mg/dL 1.37(H) 1.79(H) 1.65(H)  Sodium 135 - 145 mmol/L 133(L) 140 135(L)  Potassium 3.5 - 5.1 mmol/L 4.4 4.0 4.3  Chloride 98 - 111 mmol/L 105 104 105  CO2 22 - 32 mmol/L 20(L) 25 23    Calcium 8.9 - 10.3 mg/dL 7.9(L) 7.2(L) 7.8(L)  Total Protein 6.5 - 8.1 g/dL 5.8(L) 6.4(L) 5.9(L)  Total Bilirubin 0.3 - 1.2 mg/dL 0.2(L) 0.8 0.5  Alkaline Phos 38 - 126 U/L 53 46 56  AST 15 - 41 U/L 13(L) 14(L) 16  ALT 0 - 44 U/L 7 10 14    B12 level 299 (OSH)-->455  . Lab Results  Component Value Date   LDH 188 08/01/2017         RADIOGRAPHIC STUDIES: I have personally reviewed the radiological images as listed and agreed with the findings in the report. Nm Pet Image Restag (ps) Skull Base To Thigh  Result Date: 01/03/2017 IMPRESSION: 1. There is a new lesion in the hepatic dome which is FDG avid and low in attenuation on CT imaging consistent with metastatic disease. Another region of uptake in the left hepatic lobe posteriorly demonstrates no CT correlate. A second subtle metastasis is not excluded on today's study. An MRI could better assess for other hepatic metastases if clinically warranted. 2. New metastatic lesion in the left side of T8. 3. New pulmonary nodule in the right upper lobe. This is too small to characterize but suspicious. Recommend attention on follow-up. No FDG avid nodules or other enlarging nodules. 4. The uptake at the previous left renal artery has almost resolved in the interval and is favored to be post therapeutic. Recommend attention on follow-up. 5. The metastasis in the posterior right hilum seen previously has resolved. Electronically Signed   By: Dorise Bullion III M.D   On: 01/03/2017 11:16   .No results found.  ASSESSMENT & PLAN:    77 y.o. 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 PET/CT 06/19/2017 -- consistent with partial metabolic response to treatment.  Rt adrenal gland bx - showed clear cell RCC  S/p CYtoreductive left radical nephrectomy and left adrenal gland resection on 08/01/2016 by Dr Alinda Money.  #2  b/l adrenal metastases from [place] s/p left adrenalectomy with adrenal insuff - follows with Dr Buddy Duty.  #3 Small pulmonary lesions -- 10/16/17 PET/CT showed pulmonary less apparent than 03/28/17 PET/CT, decreasing from 34mm to 42mm diameter.  MRI brain shows no evidence of metastatic disease  #4 T7/8 Bone metastases - received Xgeva every 4 weeks from May 2018 to June 2019. 10/16/17 PET/CT revealed improvement with T7 metastatic lesion no longer significantly hypermetabolic.  -Holding Xgeva as of 11/19/17 unless indicated in the future  #5 ?liver mets- rpt PET/CT from 10/16/2017 shows no overt evidence of metastatic disease in the liver.  #6 Grade 1 Nausea - improved and intermittent. Hasnt used her anti-emetic as instructed so some nausea and decreased po intake.  #7 Grade 1 Diarrhea - from sutent controlled with prn Imodium.  #8 Hyponatremia - nearly resolved with sodium at 133 - likely related to some element of adrenal insufficiency, diarrhea, limited by mouth intake. Primarily solute free fluid intake.   #9 Acute on chronic renal insufficiency Creatine up to 2.52 - due to poor po intake and dehydration and ?ACEI - now back to baselin 1.37  #10 Hyperkalemia due to ACEI + RF- resolved  #11 Hemorrhoids -chronic with some bleeing --Recommended Sitz bath and OTC Anusol or Nupercaine for her hemorrhoid relief. 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 Buddy Duty -TSH normalized on 11/19/17 to 3.141  #14 HTN - ?control. Patient tends to be  anxious and has higher blood pressures in the clinic. She can have increased blood pressure from Sutent as well. Plan: - again reiterated need for close f/u with her PCP /cardiology regarding the many elements necessary for her care that are not directly related to her oncology care. -ACEI held due to AKI and hyperkalemia - following with cardiology to mx this. Has been started on Amlodipine instead.  #15 Grade 1 mucositis- resolved -Advised the patient to continue use of Magic Mouthwash to aid with nutrition   PLAN:  -Discussed pt labwork today, 11/19/17; HGB at 9.4, WBC normal at 6.9k, PLT at 229k, Sodium at 133, Potassium normal at 4.4, Creatinine down to 1.37.  -Discussed the 10/16/17 PET/CT which revealed Continued improvement, with the T7 metastatic lesion no longer significantly hypermetabolic. The previous right lower lobe pulmonary nodule is even less apparent, perhaps about 2 mm in diameter today; given that this measured 6 mm on 03/28/2017 this probably represents an effectively treated metastatic lesion. Currently no appreciable hypermetabolic activity is identified to suggest active malignancy. Distended gallbladder with gallbladder wall thickening and gallstones. Correlate clinically in assessing for cholecystitis. Small but abnormal amount of free pelvic fluid, nonspecific. Other imaging findings of potential clinical significance: Chronic ethmoid sinusitis. Aortic Atherosclerosis. Stable 5 mm right middle lobe pulmonary nodule, not hypermetabolic but below sensitive PET-CT size thresholds. Left nephrectomy. Notable pelvic floor laxity with cystocele. Chronic bilateral Sacroiliitis.  -Will hold Sutent at least a few weeks prior to a possible cholecystectomy -Will refer pt to [facility] fro cholecystitis -Will hold Xgeva for now, given the interval response of T7 seen on the 10/16/17 PET/CT -Recommended that the pt optimize her nutritional status and hydration status -Will  see pt back in 6 weeks   Refer to Dr Barry Dienes for cholecystitis urgently RTC with Dr Irene Limbo with labs in 6 weeks Plz cancel all upcoming Xgeva appointments   All of the patients questions were answered with apparent satisfaction. The patient knows to call the clinic with any problems, questions or concerns.  The total time spent in the appt was 30 minutes and more than 50% was on counseling and direct patient cares.     Sullivan Lone MD MS AAHIVMS [facility] Hematology/Oncology Physician [facility]  (Office):       [phone] (Work cell):  [phone] (Fax):           [phone]  I, Baldwin [place], am acting as a scribe for [name]  .I have reviewed the above documentation for accuracy and completeness, and I agree with the above. Brunetta Genera MD

## 2017-11-19 ENCOUNTER — Encounter: Payer: Self-pay | Admitting: Hematology

## 2017-11-19 ENCOUNTER — Inpatient Hospital Stay: Payer: Medicare Other

## 2017-11-19 ENCOUNTER — Telehealth: Payer: Self-pay | Admitting: Hematology

## 2017-11-19 ENCOUNTER — Inpatient Hospital Stay (HOSPITAL_BASED_OUTPATIENT_CLINIC_OR_DEPARTMENT_OTHER): Payer: Medicare Other | Admitting: Hematology

## 2017-11-19 VITALS — BP 155/72 | HR 69 | Temp 97.9°F | Resp 17 | Ht 59.0 in | Wt 139.0 lb

## 2017-11-19 DIAGNOSIS — Z79899 Other long term (current) drug therapy: Secondary | ICD-10-CM

## 2017-11-19 DIAGNOSIS — C7972 Secondary malignant neoplasm of left adrenal gland: Secondary | ICD-10-CM

## 2017-11-19 DIAGNOSIS — E119 Type 2 diabetes mellitus without complications: Secondary | ICD-10-CM

## 2017-11-19 DIAGNOSIS — K819 Cholecystitis, unspecified: Secondary | ICD-10-CM

## 2017-11-19 DIAGNOSIS — E039 Hypothyroidism, unspecified: Secondary | ICD-10-CM

## 2017-11-19 DIAGNOSIS — C7951 Secondary malignant neoplasm of bone: Secondary | ICD-10-CM

## 2017-11-19 DIAGNOSIS — K8080 Other cholelithiasis without obstruction: Secondary | ICD-10-CM

## 2017-11-19 DIAGNOSIS — C7971 Secondary malignant neoplasm of right adrenal gland: Secondary | ICD-10-CM | POA: Diagnosis not present

## 2017-11-19 DIAGNOSIS — Z905 Acquired absence of kidney: Secondary | ICD-10-CM

## 2017-11-19 DIAGNOSIS — I129 Hypertensive chronic kidney disease with stage 1 through stage 4 chronic kidney disease, or unspecified chronic kidney disease: Secondary | ICD-10-CM

## 2017-11-19 DIAGNOSIS — N184 Chronic kidney disease, stage 4 (severe): Secondary | ICD-10-CM

## 2017-11-19 DIAGNOSIS — C642 Malignant neoplasm of left kidney, except renal pelvis: Secondary | ICD-10-CM | POA: Diagnosis not present

## 2017-11-19 DIAGNOSIS — N814 Uterovaginal prolapse, unspecified: Secondary | ICD-10-CM

## 2017-11-19 DIAGNOSIS — C7800 Secondary malignant neoplasm of unspecified lung: Secondary | ICD-10-CM

## 2017-11-19 DIAGNOSIS — E785 Hyperlipidemia, unspecified: Secondary | ICD-10-CM

## 2017-11-19 LAB — CMP (CANCER CENTER ONLY)
ALT: 7 U/L (ref 0–44)
AST: 13 U/L — ABNORMAL LOW (ref 15–41)
Albumin: 3.2 g/dL — ABNORMAL LOW (ref 3.5–5.0)
Alkaline Phosphatase: 53 U/L (ref 38–126)
Anion gap: 8 (ref 5–15)
BUN: 17 mg/dL (ref 8–23)
CO2: 20 mmol/L — ABNORMAL LOW (ref 22–32)
Calcium: 7.9 mg/dL — ABNORMAL LOW (ref 8.9–10.3)
Chloride: 105 mmol/L (ref 98–111)
Creatinine: 1.37 mg/dL — ABNORMAL HIGH (ref 0.44–1.00)
GFR, Est AFR Am: 43 mL/min — ABNORMAL LOW (ref >60)
GFR, Estimated: 37 mL/min — ABNORMAL LOW (ref >60)
Glucose, Bld: 175 mg/dL — ABNORMAL HIGH (ref 70–99)
Potassium: 4.4 mmol/L (ref 3.5–5.1)
Sodium: 133 mmol/L — ABNORMAL LOW (ref 135–145)
Total Bilirubin: 0.2 mg/dL — ABNORMAL LOW (ref 0.3–1.2)
Total Protein: 5.8 g/dL — ABNORMAL LOW (ref 6.5–8.1)

## 2017-11-19 LAB — CBC WITH DIFFERENTIAL (CANCER CENTER ONLY)
Basophils Absolute: 0 10*3/uL (ref 0.0–0.1)
Basophils Relative: 0 %
Eosinophils Absolute: 0 10*3/uL (ref 0.0–0.5)
Eosinophils Relative: 0 %
HCT: 29.6 % — ABNORMAL LOW (ref 34.8–46.6)
Hemoglobin: 9.4 g/dL — ABNORMAL LOW (ref 11.6–15.9)
Lymphocytes Relative: 11 %
Lymphs Abs: 0.8 10*3/uL — ABNORMAL LOW (ref 0.9–3.3)
MCH: 33.1 pg (ref 25.1–34.0)
MCHC: 31.8 g/dL (ref 31.5–36.0)
MCV: 104.2 fL — ABNORMAL HIGH (ref 79.5–101.0)
Monocytes Absolute: 0.5 10*3/uL (ref 0.1–0.9)
Monocytes Relative: 7 %
Neutro Abs: 5.7 10*3/uL (ref 1.5–6.5)
Neutrophils Relative %: 82 %
Platelet Count: 229 10*3/uL (ref 145–400)
RBC: 2.84 MIL/uL — ABNORMAL LOW (ref 3.70–5.45)
RDW: 16.7 % — ABNORMAL HIGH (ref 11.2–14.5)
WBC Count: 6.9 10*3/uL (ref 3.9–10.3)

## 2017-11-19 LAB — TSH: TSH: 3.141 u[IU]/mL (ref 0.308–3.960)

## 2017-11-19 LAB — RETICULOCYTES
RBC.: 2.84 MIL/uL — ABNORMAL LOW (ref 3.70–5.45)
Retic Count, Absolute: 39.8 10*3/uL (ref 33.7–90.7)
Retic Ct Pct: 1.4 % (ref 0.7–2.1)

## 2017-11-19 NOTE — Telephone Encounter (Signed)
Scheduled appt per 7/31 los - referral sent through [facility] - gave patient AVS and calender per los.

## 2017-11-25 ENCOUNTER — Other Ambulatory Visit: Payer: Self-pay | Admitting: Hematology

## 2017-12-01 ENCOUNTER — Telehealth: Payer: Self-pay

## 2017-12-01 NOTE — Telephone Encounter (Signed)
Spoke with patient concerning appointment time changes. She is aware. Per 8/9 readjusting

## 2017-12-09 ENCOUNTER — Other Ambulatory Visit: Payer: Self-pay | Admitting: General Surgery

## 2017-12-25 NOTE — Progress Notes (Signed)
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 The patient's last visit with [name] was on 11/19/17. The pt reports that she is doing well overall.   The pt reports that she was found to have two blockages in her cardiac nuclear stress test with Dr. Sabra Heck. She is seeing Dr. Pearson Grippe in Nephrology. She is hoping to have a second opinion about her stress test. These work ups have been to evaluate her ability to tolerate a cholecystectomy. She notes that she is not having any pain related to her gallbladder.   The pt notes that she is taking 2mcg Levothyroxine each day with compliance. She is taking 10mg  and 5mg  Hydrocortisone.   The pt notes that she is not noticing any signs of bleeding.   The pt notes that she has not been taking Sutent for the last 7 weeks, not realizing the previous recommendation to only hold Sutent 2 weeks prior to her impending cholecystectomy.   Lab results today (12/26/17) of CBC w/diff, CMP, and Reticulocytes is as follows: all values are WNL except for RBC at 2.62, HGB at 8.3, HCT at 27.0, MCV at 103.1, MCHC at 30.7, RDW at 15.3, Glucose at 188, BUN at 26, Creatinine at 1.35, Albumin at 3.4, GFR at 37, Retic ct pct at 2.9%. 12/26/17 TSH is WNL at 0.671  On review of systems, pt reports stable energy levels, stable leg swelling,and denies signs of bleeding, blood in the stools, nausea, diarrhea, skin rashes, new bone pains, new back pains, abdominal pains, and any other symptoms.    MEDICAL HISTORY:     Hypertension Dyslipidemia Osteoarthritis Ex-smoker Coronary artery disease Thyroid disorder-was apparently on levothyroxine 25 g daily which has subsequently been discontinued . 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 Earley Brooke Colonoscopy 03/2016 Dr. Earley Brooke  SOCIAL HISTORY: Social History   Socioeconomic History  . Marital status: Married    Spouse name: Not on file  . Number of children: Not on file  . Years of education: Not on file  . Highest education level: Not on file  Occupational History  . Not on file  Social Needs  . Financial resource strain: Not on file  . Food insecurity:    Worry: Not on file    Inability: Not on file  . Transportation needs:    Medical: Not on file    Non-medical: Not on file  Tobacco Use  . Smoking status: Former Smoker    Years: 8.00    Last attempt to quit: 06/18/1964    Years since quitting: 53.5  . Smokeless tobacco: Never Used  Substance and Sexual Activity  . Alcohol use: No  . Drug use: No  . Sexual activity: Not on file  Lifestyle  . Physical activity:    Days per week: Not on file    Minutes per session: Not on file  . Stress: Not on file  Relationships  . Social  connections:    Talks on phone: Not on file    Gets together: Not on file    Attends religious service: Not on file    Active member of club or organization: Not on file    Attends meetings of clubs or organizations: Not on file    Relationship status: Not on file  . Intimate partner violence:    Fear of current or ex partner: Not on file    Emotionally abused: Not on file    Physically abused: Not on file    Forced sexual activity: Not on file  Other Topics Concern  . Not on file  Social History Narrative  . Not on file   Patient lives in [place] Works as a Solicitor  part-time Ex-smoker quit long time ago In 1961 . Smoked 2 packs per day for 8 years prior to that .  or a lot of stress since her daughter is also getting treated for stage IV uterine cancer.  FAMILY HISTORY:  Mother deceased Father with asthma and emphysema died at 73 years with the MI. Brother deceased of heart disease   ALLERGIES:  is allergic to adhesive [tape]; nsaids; and statins.  MEDICATIONS:  Current Outpatient Medications  Medication Sig Dispense Refill  . acetaminophen (TYLENOL) 500 MG tablet Take 1,000 mg by mouth 2 (two) times daily.     Marland Kitchen amLODipine (NORVASC) 5 MG tablet Take 5 mg by mouth daily.  6  . atorvastatin (LIPITOR) 40 MG tablet Take 40 mg by mouth daily at 6 PM.     . carvedilol (COREG) 6.25 MG tablet Take 6.25 mg by mouth 2 (two) times daily.  6  . clobetasol ointment (TEMOVATE) 0.62 % Apply 1 application topically 2 (two) times daily.    . hydrocortisone (CORTEF) 10 MG tablet Take 15 mg by mouth daily.     Marland Kitchen levothyroxine (SYNTHROID, LEVOTHROID) 25 MCG tablet Take 50 mcg by mouth daily before breakfast.     . ondansetron (ZOFRAN) 4 MG tablet Take 1 tablet (4 mg total) by mouth every 8 (eight) hours as needed for nausea. 30 tablet 2  . pantoprazole (PROTONIX) 20 MG tablet Take 20 mg by mouth daily.    . prochlorperazine (COMPAZINE) 10 MG tablet TAKE 1 TABLET BY MOUTH EVERY 6 HOURS AS NEEDED FOR NAUSEA OR VOMITING 30 tablet 2  . traMADol (ULTRAM) 50 MG tablet TAKE 1/2 TO 1 (ONE-HALF TO ONE) TABLET BY MOUTH EVERY 6 HOURS AS NEEDED 40 tablet 0  . furosemide (LASIX) 20 MG tablet Take 20 mg by mouth daily.    Marland Kitchen lidocaine-prilocaine (EMLA) cream Apply to affected area once 30 g 3  . magic mouthwash w/lidocaine SOLN Take 5 mLs by mouth 4 (four) times daily as needed for mouth pain. Swish and spit. (1 Part viscous lidocaine 2% 1 Part Maalox 1 Part diphenhydramine 12.5 mg per 14mL elixir) (Patient not taking: Reported on 12/26/2017) 120 mL 1  . SUNItinib (SUTENT) 37.5 MG  capsule 37.5 mg po daily 2 weeks on 1 week off (Patient not taking: Reported on 12/26/2017) 28 capsule 3   No current facility-administered medications for this visit.    Facility-Administered Medications Ordered in Other Visits  Medication Dose Route Frequency Provider Last Rate Last Dose  . denosumab (XGEVA) injection 120 mg  120 mg Subcutaneous Once Brunetta Genera, MD        REVIEW OF SYSTEMS:    A 10+ POINT REVIEW OF SYSTEMS WAS OBTAINED including neurology,  dermatology, psychiatry, cardiac, respiratory, lymph, extremities, GI, GU, Musculoskeletal, constitutional, breasts, reproductive, HEENT.  All pertinent positives are noted in the HPI.  All others are negative.   PHYSICAL EXAMINATION:  ECOG PERFORMANCE STATUS: 1 - Symptomatic but completely ambulatory   Vitals:   12/26/17 1014  BP: (!) 141/60  Pulse: 100  Resp: 18  Temp: 97.6 F (36.4 C)  SpO2: 100%   Filed Weights   12/26/17 1014  Weight: 131 lb 9.6 oz (59.7 kg)   .Body mass index is 26.58 kg/m.   GENERAL:alert, in no acute distress and comfortable SKIN: no acute rashes, no significant lesions EYES: conjunctiva are pink and non-injected, sclera anicteric OROPHARYNX: MMM, no exudates, no oropharyngeal erythema or ulceration NECK: supple, no JVD LYMPH:  no palpable lymphadenopathy in the cervical, axillary or inguinal regions LUNGS: clear to auscultation b/l with normal respiratory effort HEART: regular rate & rhythm ABDOMEN:  normoactive bowel sounds , non tender, not distended. No palpable hepatosplenomegaly.  Extremity: no pedal edema PSYCH: alert & oriented x 3 with fluent speech NEURO: no focal motor/sensory deficits   LABORATORY DATA:  I have reviewed the data as listed  . CBC Latest Ref Rng & Units 12/26/2017 11/19/2017 10/24/2017  WBC 3.9 - 10.3 K/uL 7.3 6.9 5.1  Hemoglobin 11.6 - 15.9 g/dL 8.3(L) 9.4(L) 10.4(L)  Hematocrit 34.8 - 46.6 % 27.0(L) 29.6(L) 30.9(L)  Platelets 145 - 400 K/uL 319 229 264     CBC    Component Value Date/Time   WBC 7.3 12/26/2017 0900   RBC 2.62 (L) 12/26/2017 0900   RBC 2.62 (L) 12/26/2017 0900   HGB 8.3 (L) 12/26/2017 0900   HGB 9.4 (L) 11/19/2017 1042   HGB 11.2 (L) 04/04/2017 0830   HCT 27.0 (L) 12/26/2017 0900   HCT 35.2 04/04/2017 0830   PLT 319 12/26/2017 0900   PLT 229 11/19/2017 1042   PLT 250 04/04/2017 0830   MCV 103.1 (H) 12/26/2017 0900   MCV 107.6 (H) 04/04/2017 0830   MCH 31.7 12/26/2017 0900   MCHC 30.7 (L) 12/26/2017 0900   RDW 15.3 (H) 12/26/2017 0900   RDW 14.0 04/04/2017 0830   LYMPHSABS 1.0 12/26/2017 0900   LYMPHSABS 1.6 04/04/2017 0830   MONOABS 0.9 12/26/2017 0900   MONOABS 0.4 04/04/2017 0830   EOSABS 0.1 12/26/2017 0900   EOSABS 0.1 04/04/2017 0830   BASOSABS 0.0 12/26/2017 0900   BASOSABS 0.0 04/04/2017 0830     . CMP Latest Ref Rng & Units 12/26/2017 11/19/2017 10/24/2017  Glucose 70 - 99 mg/dL 188(H) 175(H) 138(H)  BUN 8 - 23 mg/dL 26(H) 17 19  Creatinine 0.44 - 1.00 mg/dL 1.35(H) 1.37(H) 1.79(H)  Sodium 135 - 145 mmol/L 138 133(L) 140  Potassium 3.5 - 5.1 mmol/L 4.2 4.4 4.0  Chloride 98 - 111 mmol/L 107 105 104  CO2 22 - 32 mmol/L 22 20(L) 25  Calcium 8.9 - 10.3 mg/dL 8.9 7.9(L) 7.2(L)  Total Protein 6.5 - 8.1 g/dL 6.9 5.8(L) 6.4(L)  Total Bilirubin 0.3 - 1.2 mg/dL 0.5 0.2(L) 0.8  Alkaline Phos 38 - 126 U/L 90 53 46  AST 15 - 41 U/L 17 13(L) 14(L)  ALT 0 - 44 U/L 38 7 10   B12 level 299 (OSH)-->455  . Lab Results  Component Value Date   LDH 188 08/01/2017         RADIOGRAPHIC STUDIES: I have personally reviewed the radiological images as listed and agreed with the findings in the report. Nm Pet Image Restag (  ps) Skull Base To Thigh  Result Date: 01/03/2017 IMPRESSION: 1. There is a new lesion in the hepatic dome which is FDG avid and low in attenuation on CT imaging consistent with metastatic disease. Another region of uptake in the left hepatic lobe posteriorly demonstrates no CT correlate. A  second subtle metastasis is not excluded on today's study. An MRI could better assess for other hepatic metastases if clinically warranted. 2. New metastatic lesion in the left side of T8. 3. New pulmonary nodule in the right upper lobe. This is too small to characterize but suspicious. Recommend attention on follow-up. No FDG avid nodules or other enlarging nodules. 4. The uptake at the previous left renal artery has almost resolved in the interval and is favored to be post therapeutic. Recommend attention on follow-up. 5. The metastasis in the posterior right hilum seen previously has resolved. Electronically Signed   By: Dorise Bullion III M.D   On: 01/03/2017 11:16   .No results found.  ASSESSMENT & PLAN:    77 y.o. 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 PET/CT 06/19/2017 -- consistent with partial metabolic response to treatment.  Rt adrenal gland bx - showed clear cell RCC  S/p CYtoreductive left radical nephrectomy and left adrenal gland resection on 08/01/2016 by Dr Alinda Money.  10/16/17 PET/CT revealed Continued improvement, with the T7 metastatic lesion no longer significantly hypermetabolic. The previous right lower lobe pulmonary nodule is even less apparent, perhaps about 2 mm in diameter today; given that this measured 6 mm on 03/28/2017 this probably represents an effectively treated metastatic lesion. Currently no appreciable hypermetabolic activity is identified to suggest active malignancy. Distended gallbladder with gallbladder wall thickening and gallstones. Correlate clinically in assessing for cholecystitis. Small but abnormal amount of free pelvic fluid, nonspecific. Other imaging findings of potential clinical significance: Chronic ethmoid sinusitis. Aortic Atherosclerosis. Stable 5 mm right middle lobe pulmonary nodule, not hypermetabolic but below  sensitive PET-CT size thresholds. Left nephrectomy. Notable pelvic floor laxity with cystocele. Chronic bilateral Sacroiliitis.  #2 b/l adrenal metastases from [place] s/p left adrenalectomy with adrenal insuff - follows with Dr Buddy Duty.  #3 Small pulmonary lesions -- 10/16/17 PET/CT showed pulmonary less apparent than 03/28/17 PET/CT, decreasing from 42mm to 11mm diameter.   MRI brain shows no evidence of metastatic disease  #4 T7/8 Bone metastases - received Xgeva every 4 weeks from May 2018 to June 2019. 10/16/17 PET/CT revealed improvement with T7 metastatic lesion no longer significantly hypermetabolic.  -Holding Xgeva as of 11/19/17 unless indicated in the future  #5 ?liver mets- rpt PET/CT from 10/16/2017 shows no overt evidence of metastatic disease in the liver.  #6 Grade 1 Nausea - improved and intermittent. Hasnt used her anti-emetic as instructed so some nausea and decreased po intake.  #7 Grade 1 Diarrhea - from sutent controlled with prn Imodium.  #8 Hyponatremia - nearly resolved with sodium at 138 - likely related to some element of adrenal insufficiency, diarrhea, limited by mouth intake. Primarily solute free fluid intake.   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 Buddy Duty -TSH normalized on  11/19/17 to 3.141  #14 HTN - ?control. Patient tends to be anxious and has higher blood pressures in the clinic. She can have increased blood pressure from Sutent as well. Plan: - again reiterated need for close f/u with her PCP /cardiology regarding the many elements necessary for her care that are not directly related to her oncology care. -ACEI held due to AKI and hyperkalemia - following with cardiology to mx this. Has been started on Amlodipine instead.  #15 Grade 1 mucositis- resolved -Advised the patient to continue use of Magic Mouthwash to aid with nutrition  #16 Newly diagnosed ?CAD - positive cardiac nuclear stress test Following with cardiology and nephrology ? Need for pre-operative cardiac cath  #17 CHolecystitis - on hold due to cardiac issues noted on pre-operative evaluation.  PLAN:  -Will hold Xgeva for now, given the interval response of T7 seen on the 10/16/17 PET/CT -Recommended that the pt optimize her nutritional status and hydration status -Discussed pt labwork today, 12/26/17; HGB at 8.3, blood counts and chemistries are otherwise stable  -Will hold Sutent with considerations of heart blockages and impending cholecystectomy  -Will switch to Nivolumab and discussed the possible side effects of these medications and the rationale of switching treatment and potential adverse effects -Continue follow up with cardiology, nephrology and then Dr. Barry Dienes in surgery  -Provided supplemental information about immunotherapies -Will see the pt back in 2 weeks     -Schedule to start Nivolumab in about 1 week - please schedule first 2 doses q2weeks -Port a cath placement by IR -RTC with Dr Irene Limbo with 2nd dose of Nivolumab for toxicity check   All of the patients questions were answered with apparent satisfaction. The patient knows to call the clinic with any problems, questions or concerns.  The total time spent in the appt was 25 minutes and more than 50% was on counseling  and direct patient cares.    Sullivan Lone MD MS AAHIVMS [facility] Hematology/Oncology Physician [facility]  (Office):       [phone] (Work cell):  [phone] (Fax):           [phone]  I, Baldwin [place], am acting as a scribe for [name]  .I have reviewed the above documentation for accuracy and completeness, and I agree with the above. Brunetta Genera MD

## 2017-12-26 ENCOUNTER — Inpatient Hospital Stay: Payer: Medicare Other | Attending: Hematology | Admitting: Hematology

## 2017-12-26 ENCOUNTER — Encounter: Payer: Self-pay | Admitting: Hematology

## 2017-12-26 ENCOUNTER — Telehealth: Payer: Self-pay

## 2017-12-26 ENCOUNTER — Ambulatory Visit: Payer: Medicare Other | Admitting: Hematology

## 2017-12-26 ENCOUNTER — Inpatient Hospital Stay: Payer: Medicare Other

## 2017-12-26 ENCOUNTER — Other Ambulatory Visit: Payer: Medicare Other

## 2017-12-26 VITALS — BP 141/60 | HR 100 | Temp 97.6°F | Resp 18 | Ht 59.0 in | Wt 131.6 lb

## 2017-12-26 DIAGNOSIS — E871 Hypo-osmolality and hyponatremia: Secondary | ICD-10-CM | POA: Diagnosis not present

## 2017-12-26 DIAGNOSIS — E079 Disorder of thyroid, unspecified: Secondary | ICD-10-CM | POA: Diagnosis not present

## 2017-12-26 DIAGNOSIS — E119 Type 2 diabetes mellitus without complications: Secondary | ICD-10-CM | POA: Diagnosis not present

## 2017-12-26 DIAGNOSIS — C642 Malignant neoplasm of left kidney, except renal pelvis: Secondary | ICD-10-CM | POA: Diagnosis not present

## 2017-12-26 DIAGNOSIS — E039 Hypothyroidism, unspecified: Secondary | ICD-10-CM

## 2017-12-26 DIAGNOSIS — C7800 Secondary malignant neoplasm of unspecified lung: Secondary | ICD-10-CM | POA: Insufficient documentation

## 2017-12-26 DIAGNOSIS — I340 Nonrheumatic mitral (valve) insufficiency: Secondary | ICD-10-CM

## 2017-12-26 DIAGNOSIS — I252 Old myocardial infarction: Secondary | ICD-10-CM

## 2017-12-26 DIAGNOSIS — Z79899 Other long term (current) drug therapy: Secondary | ICD-10-CM

## 2017-12-26 DIAGNOSIS — M461 Sacroiliitis, not elsewhere classified: Secondary | ICD-10-CM | POA: Insufficient documentation

## 2017-12-26 DIAGNOSIS — E440 Moderate protein-calorie malnutrition: Secondary | ICD-10-CM | POA: Diagnosis not present

## 2017-12-26 DIAGNOSIS — Z5112 Encounter for antineoplastic immunotherapy: Secondary | ICD-10-CM

## 2017-12-26 DIAGNOSIS — K8020 Calculus of gallbladder without cholecystitis without obstruction: Secondary | ICD-10-CM | POA: Diagnosis not present

## 2017-12-26 DIAGNOSIS — Z87891 Personal history of nicotine dependence: Secondary | ICD-10-CM | POA: Insufficient documentation

## 2017-12-26 DIAGNOSIS — C7951 Secondary malignant neoplasm of bone: Secondary | ICD-10-CM | POA: Insufficient documentation

## 2017-12-26 DIAGNOSIS — E538 Deficiency of other specified B group vitamins: Secondary | ICD-10-CM | POA: Diagnosis not present

## 2017-12-26 DIAGNOSIS — I10 Essential (primary) hypertension: Secondary | ICD-10-CM | POA: Insufficient documentation

## 2017-12-26 DIAGNOSIS — I700 Atherosclerosis of aorta: Secondary | ICD-10-CM | POA: Diagnosis not present

## 2017-12-26 DIAGNOSIS — Z7189 Other specified counseling: Secondary | ICD-10-CM

## 2017-12-26 DIAGNOSIS — N2889 Other specified disorders of kidney and ureter: Secondary | ICD-10-CM | POA: Diagnosis not present

## 2017-12-26 DIAGNOSIS — N8110 Cystocele, unspecified: Secondary | ICD-10-CM | POA: Insufficient documentation

## 2017-12-26 DIAGNOSIS — K449 Diaphragmatic hernia without obstruction or gangrene: Secondary | ICD-10-CM | POA: Diagnosis not present

## 2017-12-26 DIAGNOSIS — M1990 Unspecified osteoarthritis, unspecified site: Secondary | ICD-10-CM | POA: Insufficient documentation

## 2017-12-26 DIAGNOSIS — R197 Diarrhea, unspecified: Secondary | ICD-10-CM | POA: Insufficient documentation

## 2017-12-26 DIAGNOSIS — E2749 Other adrenocortical insufficiency: Secondary | ICD-10-CM

## 2017-12-26 DIAGNOSIS — E875 Hyperkalemia: Secondary | ICD-10-CM | POA: Diagnosis not present

## 2017-12-26 DIAGNOSIS — E785 Hyperlipidemia, unspecified: Secondary | ICD-10-CM

## 2017-12-26 DIAGNOSIS — Z905 Acquired absence of kidney: Secondary | ICD-10-CM | POA: Insufficient documentation

## 2017-12-26 DIAGNOSIS — I2510 Atherosclerotic heart disease of native coronary artery without angina pectoris: Secondary | ICD-10-CM | POA: Insufficient documentation

## 2017-12-26 DIAGNOSIS — Z8049 Family history of malignant neoplasm of other genital organs: Secondary | ICD-10-CM | POA: Insufficient documentation

## 2017-12-26 DIAGNOSIS — K649 Unspecified hemorrhoids: Secondary | ICD-10-CM

## 2017-12-26 LAB — CMP (CANCER CENTER ONLY)
ALT: 38 U/L (ref 0–44)
AST: 17 U/L (ref 15–41)
Albumin: 3.4 g/dL — ABNORMAL LOW (ref 3.5–5.0)
Alkaline Phosphatase: 90 U/L (ref 38–126)
Anion gap: 9 (ref 5–15)
BUN: 26 mg/dL — ABNORMAL HIGH (ref 8–23)
CO2: 22 mmol/L (ref 22–32)
Calcium: 8.9 mg/dL (ref 8.9–10.3)
Chloride: 107 mmol/L (ref 98–111)
Creatinine: 1.35 mg/dL — ABNORMAL HIGH (ref 0.44–1.00)
GFR, Est AFR Am: 43 mL/min — ABNORMAL LOW (ref >60)
GFR, Estimated: 37 mL/min — ABNORMAL LOW (ref >60)
Glucose, Bld: 188 mg/dL — ABNORMAL HIGH (ref 70–99)
Potassium: 4.2 mmol/L (ref 3.5–5.1)
Sodium: 138 mmol/L (ref 135–145)
Total Bilirubin: 0.5 mg/dL (ref 0.3–1.2)
Total Protein: 6.9 g/dL (ref 6.5–8.1)

## 2017-12-26 LAB — CBC WITH DIFFERENTIAL/PLATELET
Basophils Absolute: 0 10*3/uL (ref 0.0–0.1)
Basophils Relative: 0 %
Eosinophils Absolute: 0.1 10*3/uL (ref 0.0–0.5)
Eosinophils Relative: 1 %
HCT: 27 % — ABNORMAL LOW (ref 34.8–46.6)
Hemoglobin: 8.3 g/dL — ABNORMAL LOW (ref 11.6–15.9)
Lymphocytes Relative: 13 %
Lymphs Abs: 1 10*3/uL (ref 0.9–3.3)
MCH: 31.7 pg (ref 25.1–34.0)
MCHC: 30.7 g/dL — ABNORMAL LOW (ref 31.5–36.0)
MCV: 103.1 fL — ABNORMAL HIGH (ref 79.5–101.0)
Monocytes Absolute: 0.9 10*3/uL (ref 0.1–0.9)
Monocytes Relative: 12 %
Neutro Abs: 5.3 10*3/uL (ref 1.5–6.5)
Neutrophils Relative %: 74 %
Platelets: 319 10*3/uL (ref 145–400)
RBC: 2.62 MIL/uL — ABNORMAL LOW (ref 3.70–5.45)
RDW: 15.3 % — ABNORMAL HIGH (ref 11.2–14.5)
WBC: 7.3 10*3/uL (ref 3.9–10.3)

## 2017-12-26 LAB — TSH: TSH: 0.671 u[IU]/mL (ref 0.308–3.960)

## 2017-12-26 LAB — RETICULOCYTES
RBC.: 2.62 MIL/uL — ABNORMAL LOW (ref 3.70–5.45)
Retic Count, Absolute: 76 10*3/uL (ref 33.7–90.7)
Retic Ct Pct: 2.9 % — ABNORMAL HIGH (ref 0.7–2.1)

## 2017-12-26 MED ORDER — LIDOCAINE-PRILOCAINE 2.5-2.5 % EX CREA: TOPICAL_CREAM | CUTANEOUS | 3 refills | Status: DC

## 2017-12-26 NOTE — Progress Notes (Signed)
START ON PATHWAY REGIMEN - Renal Cell     A cycle is every 28 days:     Nivolumab   **Always confirm dose/schedule in your pharmacy ordering system**  Patient Characteristics: Metastatic, Clear Cell, Second Line, No Prior Checkpoint Inhibitor AJCC M Category: M1 AJCC 8 Stage Grouping: IV Current evidence of distant metastases<= Yes AJCC T Category: TX AJCC N Category: NX Does patient have oligometastatic disease<= No Histology: Clear Cell Line of therapy: Second Line Intent of Therapy: Non-Curative / Palliative Intent, Discussed with Patient

## 2017-12-26 NOTE — Telephone Encounter (Signed)
Printed avs and calender of upcoming appointment. Per 9/6 os

## 2017-12-29 ENCOUNTER — Telehealth: Payer: Self-pay

## 2017-12-29 NOTE — Telephone Encounter (Signed)
Patient called and stated that she talked with her family over the weekend and has decided she wants to resume taking Sutent instead of starting immunotherapy with Nivolumab. [name] made aware. Informed patient that per [name] due to positive stress test, possible gallbladder surgery, and heart disease/possible surgery Nivolumab is the better choice at this time. Patient stated she does not like side effects listed for Opdivo and she will discuss with he husband tonight and call the office in the morning.

## 2017-12-31 ENCOUNTER — Other Ambulatory Visit: Payer: Self-pay | Admitting: Hematology

## 2017-12-31 DIAGNOSIS — C642 Malignant neoplasm of left kidney, except renal pelvis: Secondary | ICD-10-CM

## 2018-01-02 ENCOUNTER — Inpatient Hospital Stay: Payer: Medicare Other

## 2018-01-02 VITALS — BP 169/67 | HR 68 | Temp 97.9°F | Resp 16 | Ht 59.0 in | Wt 131.8 lb

## 2018-01-02 DIAGNOSIS — Z5112 Encounter for antineoplastic immunotherapy: Secondary | ICD-10-CM | POA: Diagnosis not present

## 2018-01-02 DIAGNOSIS — C642 Malignant neoplasm of left kidney, except renal pelvis: Secondary | ICD-10-CM

## 2018-01-02 DIAGNOSIS — C7951 Secondary malignant neoplasm of bone: Secondary | ICD-10-CM

## 2018-01-02 MED ORDER — SODIUM CHLORIDE 0.9 % IV SOLN
Freq: Once | INTRAVENOUS | Status: AC
  Administered 2018-01-02: 09:00:00 via INTRAVENOUS
  Filled 2018-01-02: qty 250

## 2018-01-02 MED ORDER — SODIUM CHLORIDE 0.9 % IV SOLN
240.0000 mg | Freq: Once | INTRAVENOUS | Status: AC
  Administered 2018-01-02: 240 mg via INTRAVENOUS
  Filled 2018-01-02: qty 24

## 2018-01-02 NOTE — Progress Notes (Signed)
Per [name]. Ok to treat with labs from 12/26/17.

## 2018-01-02 NOTE — Patient Instructions (Signed)
Audrey [name] Discharge Instructions for Patients Receiving Chemotherapy  Today you received the following chemotherapy agents Opdivo.   To help prevent nausea and vomiting after your treatment, we encourage you to take your nausea medication as directed by your physician.   If you develop nausea and vomiting that is not controlled by your nausea medication, call the clinic.   BELOW ARE SYMPTOMS THAT SHOULD BE REPORTED IMMEDIATELY:  *FEVER GREATER THAN 100.5 F  *CHILLS WITH OR WITHOUT FEVER  NAUSEA AND VOMITING THAT IS NOT CONTROLLED WITH YOUR NAUSEA MEDICATION  *UNUSUAL SHORTNESS OF BREATH  *UNUSUAL BRUISING OR BLEEDING  TENDERNESS IN MOUTH AND THROAT WITH OR WITHOUT PRESENCE OF ULCERS  *URINARY PROBLEMS  *BOWEL PROBLEMS  UNUSUAL RASH Items with * indicate a potential emergency and should be followed up as soon as possible.  Feel free to call the clinic should you have any questions or concerns. The clinic phone number is [phone].  Please show the [place] at check-in to the Emergency Department and triage nurse.  Nivolumab injection What is this medicine? NIVOLUMAB (nye VOL ue mab) is a monoclonal antibody. It is used to treat melanoma, lung cancer, kidney cancer, head and neck cancer, Hodgkin lymphoma, urothelial cancer, colon cancer, and liver cancer. This medicine may be used for other purposes; ask your health care provider or pharmacist if you have questions. COMMON BRAND NAME(S): Opdivo What should I tell my health care provider before I take this medicine? They need to know if you have any of these conditions: -diabetes -immune system problems -kidney disease -liver disease -lung disease -organ transplant -stomach or intestine problems -thyroid disease -an unusual or allergic reaction to nivolumab, other medicines, foods, dyes, or preservatives -pregnant or trying to get pregnant -breast-feeding How should I use this  medicine? This medicine is for infusion into a vein. It is given by a health care professional in a hospital or clinic setting. A special MedGuide will be given to you before each treatment. Be sure to read this information carefully each time. Talk to your pediatrician regarding the use of this medicine in children. While this drug may be prescribed for children as young as 12 years for selected conditions, precautions do apply. Overdosage: If you think you have taken too much of this medicine contact a poison control center or emergency room at once. NOTE: This medicine is only for you. Do not share this medicine with others. What if I miss a dose? It is important not to miss your dose. Call your doctor or health care professional if you are unable to keep an appointment. What may interact with this medicine? Interactions have not been studied. Give your health care provider a list of all the medicines, herbs, non-prescription drugs, or dietary supplements you use. Also tell them if you smoke, drink alcohol, or use illegal drugs. Some items may interact with your medicine. This list may not describe all possible interactions. Give your health care provider a list of all the medicines, herbs, non-prescription drugs, or dietary supplements you use. Also tell them if you smoke, drink alcohol, or use illegal drugs. Some items may interact with your medicine. What should I watch for while using this medicine? This drug may make you feel generally unwell. Continue your course of treatment even though you feel ill unless your doctor tells you to stop. You may need blood work done while you are taking this medicine. Do not become pregnant while taking this medicine or for  5 months after stopping it. Women should inform their doctor if they wish to become pregnant or think they might be pregnant. There is a potential for serious side effects to an unborn child. Talk to your health care professional or  pharmacist for more information. Do not breast-feed an infant while taking this medicine. What side effects may I notice from receiving this medicine? Side effects that you should report to your doctor or health care professional as soon as possible: -allergic reactions like skin rash, itching or hives, swelling of the face, lips, or tongue -black, tarry stools -blood in the urine -bloody or watery diarrhea -changes in vision -change in sex drive -changes in emotions or moods -chest pain -confusion -cough -decreased appetite -diarrhea -facial flushing -feeling faint or lightheaded -fever, chills -hair loss -hallucination, loss of contact with reality -headache -irritable -joint pain -loss of memory -muscle pain -muscle weakness -seizures -shortness of breath -signs and symptoms of high blood sugar such as dizziness; dry mouth; dry skin; fruity breath; nausea; stomach pain; increased hunger or thirst; increased urination -signs and symptoms of kidney injury like trouble passing urine or change in the amount of urine -signs and symptoms of liver injury like dark yellow or brown urine; general ill feeling or flu-like symptoms; light-colored stools; loss of appetite; nausea; right upper belly pain; unusually weak or tired; yellowing of the eyes or skin -stiff neck -swelling of the ankles, feet, hands -weight gain Side effects that usually do not require medical attention (report to your doctor or health care professional if they continue or are bothersome): -bone pain -constipation -tiredness -vomiting This list may not describe all possible side effects. Call your doctor for medical advice about side effects. You may report side effects to FDA at 1-800-FDA-1088. Where should I keep my medicine? This drug is given in a hospital or clinic and will not be stored at home. NOTE: This sheet is a summary. It may not cover all possible information. If you have questions about this  medicine, talk to your doctor, pharmacist, or health care provider.  2018 Elsevier/Gold Standard (2016-01-15 17:49:34)    

## 2018-01-04 DIAGNOSIS — Z7189 Other specified counseling: Secondary | ICD-10-CM | POA: Insufficient documentation

## 2018-01-07 ENCOUNTER — Other Ambulatory Visit: Payer: Self-pay | Admitting: Radiology

## 2018-01-07 ENCOUNTER — Other Ambulatory Visit: Payer: Self-pay | Admitting: Physician Assistant

## 2018-01-08 ENCOUNTER — Other Ambulatory Visit: Payer: Self-pay

## 2018-01-08 ENCOUNTER — Ambulatory Visit (HOSPITAL_COMMUNITY)
Admission: RE | Admit: 2018-01-08 | Discharge: 2018-01-08 | Disposition: A | Discharge status: Home / Self Care | Payer: Medicare Other | Source: Ambulatory Visit | Attending: Hematology | Admitting: Hematology

## 2018-01-08 ENCOUNTER — Encounter (HOSPITAL_COMMUNITY): Payer: Self-pay

## 2018-01-08 DIAGNOSIS — C642 Malignant neoplasm of left kidney, except renal pelvis: Secondary | ICD-10-CM | POA: Diagnosis present

## 2018-01-08 DIAGNOSIS — E119 Type 2 diabetes mellitus without complications: Secondary | ICD-10-CM | POA: Diagnosis not present

## 2018-01-08 DIAGNOSIS — Z79899 Other long term (current) drug therapy: Secondary | ICD-10-CM | POA: Diagnosis not present

## 2018-01-08 DIAGNOSIS — C7970 Secondary malignant neoplasm of unspecified adrenal gland: Secondary | ICD-10-CM | POA: Insufficient documentation

## 2018-01-08 DIAGNOSIS — I10 Essential (primary) hypertension: Secondary | ICD-10-CM | POA: Insufficient documentation

## 2018-01-08 DIAGNOSIS — Z87891 Personal history of nicotine dependence: Secondary | ICD-10-CM | POA: Insufficient documentation

## 2018-01-08 DIAGNOSIS — Z905 Acquired absence of kidney: Secondary | ICD-10-CM | POA: Diagnosis not present

## 2018-01-08 DIAGNOSIS — Z886 Allergy status to analgesic agent status: Secondary | ICD-10-CM | POA: Diagnosis not present

## 2018-01-08 DIAGNOSIS — Z79890 Hormone replacement therapy (postmenopausal): Secondary | ICD-10-CM | POA: Insufficient documentation

## 2018-01-08 DIAGNOSIS — Z888 Allergy status to other drugs, medicaments and biological substances status: Secondary | ICD-10-CM | POA: Insufficient documentation

## 2018-01-08 DIAGNOSIS — E785 Hyperlipidemia, unspecified: Secondary | ICD-10-CM | POA: Insufficient documentation

## 2018-01-08 HISTORY — PX: IR IMAGING GUIDED PORT INSERTION: IMG5740

## 2018-01-08 LAB — BASIC METABOLIC PANEL
Anion gap: 13 (ref 5–15)
BUN: 24 mg/dL — ABNORMAL HIGH (ref 8–23)
CO2: 23 mmol/L (ref 22–32)
Calcium: 9.4 mg/dL (ref 8.9–10.3)
Chloride: 105 mmol/L (ref 98–111)
Creatinine, Ser: 1.29 mg/dL — ABNORMAL HIGH (ref 0.44–1.00)
GFR calc Af Amer: 46 mL/min — ABNORMAL LOW (ref >60)
GFR calc non Af Amer: 39 mL/min — ABNORMAL LOW (ref >60)
Glucose, Bld: 163 mg/dL — ABNORMAL HIGH (ref 70–99)
Potassium: 4.2 mmol/L (ref 3.5–5.1)
Sodium: 141 mmol/L (ref 135–145)

## 2018-01-08 LAB — CBC WITH DIFFERENTIAL/PLATELET
Basophils Absolute: 0 10*3/uL (ref 0.0–0.1)
Basophils Relative: 1 %
Eosinophils Absolute: 0.1 10*3/uL (ref 0.0–0.7)
Eosinophils Relative: 1 %
HCT: 32.1 % — ABNORMAL LOW (ref 36.0–46.0)
Hemoglobin: 10 g/dL — ABNORMAL LOW (ref 12.0–15.0)
Lymphocytes Relative: 21 %
Lymphs Abs: 1.3 10*3/uL (ref 0.7–4.0)
MCH: 31.3 pg (ref 26.0–34.0)
MCHC: 31.2 g/dL (ref 30.0–36.0)
MCV: 100.6 fL — ABNORMAL HIGH (ref 78.0–100.0)
Monocytes Absolute: 0.6 10*3/uL (ref 0.1–1.0)
Monocytes Relative: 9 %
Neutro Abs: 4.3 10*3/uL (ref 1.7–7.7)
Neutrophils Relative %: 68 %
Platelets: 461 10*3/uL — ABNORMAL HIGH (ref 150–400)
RBC: 3.19 MIL/uL — ABNORMAL LOW (ref 3.87–5.11)
RDW: 15.5 % (ref 11.5–15.5)
WBC: 6.3 10*3/uL (ref 4.0–10.5)

## 2018-01-08 LAB — GLUCOSE, CAPILLARY: Glucose-Capillary: 154 mg/dL — ABNORMAL HIGH (ref 70–99)

## 2018-01-08 LAB — PROTIME-INR
INR: 0.87
Prothrombin Time: 11.7 seconds (ref 11.4–15.2)

## 2018-01-08 MED ORDER — SODIUM CHLORIDE 0.9 % IV SOLN
INTRAVENOUS | Status: DC
  Administered 2018-01-08: 09:00:00 via INTRAVENOUS

## 2018-01-08 MED ORDER — HEPARIN SOD (PORK) LOCK FLUSH 100 UNIT/ML IV SOLN
INTRAVENOUS | Status: AC | PRN
  Administered 2018-01-08: 500 [IU] via INTRAVENOUS

## 2018-01-08 MED ORDER — CEFAZOLIN SODIUM-DEXTROSE 2-4 GM/100ML-% IV SOLN
2.0000 g | INTRAVENOUS | Status: AC
  Administered 2018-01-08: 2 g via INTRAVENOUS

## 2018-01-08 MED ORDER — FENTANYL CITRATE (PF) 100 MCG/2ML IJ SOLN
INTRAMUSCULAR | Status: AC
  Filled 2018-01-08: qty 2

## 2018-01-08 MED ORDER — FENTANYL CITRATE (PF) 100 MCG/2ML IJ SOLN
INTRAMUSCULAR | Status: AC | PRN
  Administered 2018-01-08 (×2): 50 ug via INTRAVENOUS

## 2018-01-08 MED ORDER — MIDAZOLAM HCL 2 MG/2ML IJ SOLN
INTRAMUSCULAR | Status: AC
  Filled 2018-01-08: qty 2

## 2018-01-08 MED ORDER — MIDAZOLAM HCL 2 MG/2ML IJ SOLN
INTRAMUSCULAR | Status: AC | PRN
  Administered 2018-01-08 (×2): 1 mg via INTRAVENOUS

## 2018-01-08 MED ORDER — LIDOCAINE HCL 1 % IJ SOLN
INTRAMUSCULAR | Status: AC
  Filled 2018-01-08: qty 20

## 2018-01-08 MED ORDER — HEPARIN SOD (PORK) LOCK FLUSH 100 UNIT/ML IV SOLN
INTRAVENOUS | Status: AC
  Filled 2018-01-08: qty 5

## 2018-01-08 MED ORDER — CEFAZOLIN SODIUM-DEXTROSE 2-4 GM/100ML-% IV SOLN
INTRAVENOUS | Status: AC
  Administered 2018-01-08: 2 g via INTRAVENOUS
  Filled 2018-01-08: qty 100

## 2018-01-08 NOTE — Procedures (Signed)
Interventional Radiology Procedure Note  Procedure: Placement of a right IJ approach single lumen PowerPort.  Tip is positioned at the superior cavoatrial junction and catheter is ready for immediate use.  Complications: None Recommendations:  - Ok to shower tomorrow - Do not submerge for 7 days - Routine line care   Signed,  Anishka Bushard S. Gaspard Isbell, DO   

## 2018-01-08 NOTE — Discharge Instructions (Signed)
Moderate Conscious Sedation, Adult, Care After °These instructions provide you with information about caring for yourself after your procedure. Your health care provider may also give you more specific instructions. Your treatment has been planned according to current medical practices, but problems sometimes occur. Call your health care provider if you have any problems or questions after your procedure. °What can I expect after the procedure? °After your procedure, it is common: °· To feel sleepy for several hours. °· To feel clumsy and have poor balance for several hours. °· To have poor judgment for several hours. °· To vomit if you eat too soon. ° °Follow these instructions at home: °For at least 24 hours after the procedure: ° °· Do not: °? Participate in activities where you could fall or become injured. °? Drive. °? Use heavy machinery. °? Drink alcohol. °? Take sleeping pills or medicines that cause drowsiness. °? Make important decisions or sign legal documents. °? Take care of children on your own. °· Rest. °Eating and drinking °· Follow the diet recommended by your health care provider. °· If you vomit: °? Drink water, juice, or soup when you can drink without vomiting. °? Make sure you have little or no nausea before eating solid foods. °General instructions °· Have a responsible adult stay with you until you are awake and alert. °· Take over-the-counter and prescription medicines only as told by your health care provider. °· If you smoke, do not smoke without supervision. °· Keep all follow-up visits as told by your health care provider. This is important. °Contact a health care provider if: °· You keep feeling nauseous or you keep vomiting. °· You feel light-headed. °· You develop a rash. °· You have a fever. °Get help right away if: °· You have trouble breathing. °This information is not intended to replace advice given to you by your health care provider. Make sure you discuss any questions you have  with your health care provider. °Document Released: 01/27/2013 Document Revised: 09/11/2015 Document Reviewed: 07/29/2015 °Elsevier Interactive Patient Education © 2018 [employer] °Implanted Port Insertion, Care After °This sheet gives you information about how to care for yourself after your procedure. Your health care provider may also give you more specific instructions. If you have problems or questions, contact your health care provider. °What can I expect after the procedure? °After your procedure, it is common to have: °· Discomfort at the port insertion site. °· Bruising on the skin over the port. This should improve over 3-4 days. ° °Follow these instructions at home: °Port care °· After your port is placed, you will get a manufacturer's information card. The card has information about your port. Keep this card with you at all times. °· Take care of the port as told by your health care provider. Ask your health care provider if you or a family member can get training for taking care of the port at home. A home health care nurse may also take care of the port. °· Make sure to remember what type of port you have. °Incision care °· Follow instructions from your health care provider about how to take care of your port insertion site. Make sure you: °? Wash your hands with soap and water before you change your bandage (dressing). If soap and water are not available, use hand sanitizer. °? Change your dressing as told by your health care provider. °? Leave stitches (sutures), skin glue, or adhesive strips in place. These skin closures may need to stay   in place for 2 weeks or longer. If adhesive strip edges start to loosen and curl up, you may trim the loose edges. Do not remove adhesive strips completely unless your health care provider tells you to do that. °· Check your port insertion site every day for signs of infection. Check for: °? More redness, swelling, or pain. °? More fluid or  blood. °? Warmth. °? Pus or a bad smell. °General instructions °· Do not take baths, swim, or use a hot tub until your health care provider approves. °· Do not lift anything that is heavier than 10 lb (4.5 kg) for a week, or as told by your health care provider. °· Ask your health care provider when it is okay to: °? Return to work or school. °? Resume usual physical activities or sports. °· Do not drive for 24 hours if you were given a medicine to help you relax (sedative). °· Take over-the-counter and prescription medicines only as told by your health care provider. °· Wear a medical alert bracelet in case of an emergency. This will tell any health care providers that you have a port. °· Keep all follow-up visits as told by your health care provider. This is important. °Contact a health care provider if: °· You cannot flush your port with saline as directed, or you cannot draw blood from the port. °· You have a fever or chills. °· You have more redness, swelling, or pain around your port insertion site. °· You have more fluid or blood coming from your port insertion site. °· Your port insertion site feels warm to the touch. °· You have pus or a bad smell coming from the port insertion site. °Get help right away if: °· You have chest pain or shortness of breath. °· You have bleeding from your port that you cannot control. °Summary °· Take care of the port as told by your health care provider. °· Change your dressing as told by your health care provider. °· Keep all follow-up visits as told by your health care provider. °This information is not intended to replace advice given to you by your health care provider. Make sure you discuss any questions you have with your health care provider. °Document Released: 01/27/2013 Document Revised: 02/28/2016 Document Reviewed: 02/28/2016 °Elsevier Interactive Patient Education © 2017 [employer] ° °

## 2018-01-08 NOTE — H&P (Signed)
Chief Complaint: Patient was seen in consultation today for renal cell carcinoma.  Referring Physician(s): Brunetta Genera  Supervising Physician: Corrie Mckusick  Patient Status: [facility] - Out-pt  History of Present Illness: Audrey [name] is a 77 y.o. female with a past medical history of hypertension, hyperlipidemia, renal cell carcinoma, and diabetes mellitus. 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 She underwent a left radical nephrectomy and left adrenal gland resection 08/01/2016 with Dr. Alinda Money. Her cancer is managed by [name].  IR requested by [name] for possible image-guided Port-a-cath insertion so that patient can begin chemotherapy. Patient awake and alert sitting in bed with no complaints at this time. Denies fever, chills, chest pain, dyspnea, abdominal pain, headache, or dizziness.   Past Medical History:  Diagnosis Date  . Diabetes mellitus ([place])   . HTN (hypertension)   . Hyperlipidemia     Past Surgical History:  Procedure Laterality Date  . LAPAROSCOPIC NEPHRECTOMY Left 08/01/2016   Procedure: LAPAROSCOPIC  RADICAL NEPHRECTOMY/ REPAIR OF UMBILICAL HERNIA;  Surgeon: Raynelle Bring, MD;  Location: [facility] ORS;  Service: Urology;  Laterality: Left;    Allergies: Adhesive [tape]; Nsaids; and Statins  Medications: Prior to Admission medications   Medication Sig Start Date End Date Taking? Authorizing Provider  acetaminophen (TYLENOL) 500 MG tablet Take 1,000 mg by mouth 2 (two) times daily.    Yes [provider]  amLODipine (NORVASC) 5 MG tablet Take 5 mg by mouth daily. 09/01/17  Yes [provider]  atorvastatin (LIPITOR) 40 MG tablet Take 40 mg by mouth daily at 6 PM.    Yes [provider]  carvedilol (COREG) 6.25 MG tablet Take 6.25 mg by mouth 2 (two) times daily. 09/01/17  Yes [provider]  hydrocortisone (CORTEF) 10 MG tablet Take 15 mg by mouth daily.     Yes [provider]  levothyroxine (SYNTHROID, LEVOTHROID) 25 MCG tablet Take 50 mcg by mouth daily before breakfast.    Yes [provider]  pantoprazole (PROTONIX) 20 MG tablet Take 20 mg by mouth daily.   Yes [provider]  clobetasol ointment (TEMOVATE) 7.82 % Apply 1 application topically 2 (two) times daily.    [provider]  furosemide (LASIX) 20 MG tablet Take 20 mg by mouth daily.    [provider]  lidocaine-prilocaine (EMLA) cream Apply to affected area once 12/26/17   Brunetta Genera, MD  magic mouthwash w/lidocaine SOLN Take 5 mLs by mouth 4 (four) times daily as needed for mouth pain. Swish and spit. (1 Part viscous lidocaine 2% 1 Part Maalox 1 Part diphenhydramine 12.5 mg per 30mL elixir) Patient not taking: Reported on 12/26/2017 04/25/17   Brunetta Genera, MD  ondansetron (ZOFRAN) 4 MG tablet Take 1 tablet (4 mg total) by mouth every 8 (eight) hours as needed for nausea. 06/03/17   Brunetta Genera, MD  prochlorperazine (COMPAZINE) 10 MG tablet TAKE 1 TABLET BY MOUTH EVERY 6 HOURS AS NEEDED FOR NAUSEA OR VOMITING 06/03/17   Brunetta Genera, MD  SUNItinib (SUTENT) 37.5 MG capsule 37.5 mg po daily 2 weeks on 1 week off Patient not taking: Reported on 12/26/2017 09/10/17   Brunetta Genera, MD  traMADol (ULTRAM) 50 MG tablet TAKE 1/2 TO 1 (ONE-HALF TO ONE) TABLET BY MOUTH EVERY 6 HOURS AS NEEDED 11/30/17   Brunetta Genera, MD     History reviewed. No pertinent family history.  Social History  Socioeconomic History  . Marital status: Married    Spouse name: Not on file  . Number of children: Not on file  . Years of education: Not on file  . Highest education level: Not on file  Occupational History  . Not on file  Social Needs  . Financial resource strain: Not on file  . Food insecurity:    Worry: Not on file    Inability: Not on file  . Transportation needs:    Medical: Not on file    Non-medical: Not on  file  Tobacco Use  . Smoking status: Former Smoker    Years: 8.00    Last attempt to quit: 06/18/1964    Years since quitting: 53.5  . Smokeless tobacco: Never Used  Substance and Sexual Activity  . Alcohol use: No  . Drug use: No  . Sexual activity: Not on file  Lifestyle  . Physical activity:    Days per week: Not on file    Minutes per session: Not on file  . Stress: Not on file  Relationships  . Social connections:    Talks on phone: Not on file    Gets together: Not on file    Attends religious service: Not on file    Active member of club or organization: Not on file    Attends meetings of clubs or organizations: Not on file    Relationship status: Not on file  Other Topics Concern  . Not on file  Social History Narrative  . Not on file     Review of Systems: A 12 point ROS discussed and pertinent positives are indicated in the HPI above.  All other systems are negative.  Review of Systems  Constitutional: Negative for chills and fever.  Respiratory: Negative for shortness of breath and wheezing.   Cardiovascular: Negative for chest pain and palpitations.  Gastrointestinal: Negative for abdominal pain.  Neurological: Negative for dizziness and headaches.  Psychiatric/Behavioral: Negative for behavioral problems and confusion.    Vital Signs: BP (!) 186/81 (BP Location: Right Arm)   Pulse 68   Temp 97.8 F (36.6 C) (Oral)   Resp 16   SpO2 100%   Physical Exam  Constitutional: She is oriented to person, place, and time. She appears well-developed and well-nourished. No distress.  Cardiovascular: Normal rate, regular rhythm and normal heart sounds.  No murmur heard. Pulmonary/Chest: Effort normal and breath sounds normal. No respiratory distress. She has no wheezes.  Neurological: She is alert and oriented to person, place, and time.  Skin: Skin is warm and dry.  Psychiatric: She has a normal mood and affect. Her behavior is normal. Judgment and thought  content normal.  Nursing note and vitals reviewed.    MD Evaluation Airway: WNL Heart: WNL Abdomen: WNL Chest/ Lungs: WNL ASA  Classification: 3 Mallampati/Airway Score: One   Imaging: No results found.  Labs:  CBC: Recent Labs    09/10/17 0837 10/24/17 0950 11/19/17 1042 12/26/17 0900  WBC 5.1 5.1 6.9 7.3  HGB 10.7* 10.4* 9.4* 8.3*  HCT 33.7* 30.9* 29.6* 27.0*  PLT 216 264 229 319    COAGS: No results for input(s): INR, APTT in the last 8760 hours.  BMP: Recent Labs    09/10/17 0837 10/24/17 0950 11/19/17 1042 12/26/17 0900  NA 135* 140 133* 138  K 4.3 4.0 4.4 4.2  CL 105 104 105 107  CO2 23 25 20* 22  GLUCOSE 126 138* 175* 188*  BUN 16 19 17  26*  CALCIUM 7.8* 7.2* 7.9* 8.9  CREATININE 1.65* 1.79* 1.37* 1.35*  GFRNONAA 29* 27* 37* 37*  GFRAA 34* 31* 43* 43*    LIVER FUNCTION TESTS: Recent Labs    09/10/17 0837 10/24/17 0950 11/19/17 1042 12/26/17 0900  BILITOT 0.5 0.8 0.2* 0.5  AST 16 14* 13* 17  ALT 14 10 7  38  ALKPHOS 56 46 53 90  PROT 5.9* 6.4* 5.8* 6.9  ALBUMIN 3.4* 3.6 3.2* 3.4*    TUMOR MARKERS: No results for input(s): AFPTM, CEA, CA199, CHROMGRNA in the last 8760 hours.  Assessment and Plan:  Renal cell carcinoma with tenitative plans to begin chemotherapy. Plan for image-guided Port-a-cath insertion today with Dr. Earleen Newport. Patient is NPO. Denies fever. She does not take blood thinners. INR pending.  Risks and benefits of image guided port-a-catheter placement was discussed with the patient including, but not limited to bleeding, infection, pneumothorax, or fibrin sheath development and need for additional procedures. All of the patient's questions were answered, patient is agreeable to proceed. Consent signed and in chart.   Thank you for this interesting consult.  I greatly enjoyed meeting Audrey [name] and look forward to participating in their care.  A copy of this report was sent to the requesting provider on this  date.  Electronically Signed: Earley Abide, PA-C 01/08/2018, 8:32 AM   I spent a total of 25 Minutes in face to face in clinical consultation, greater than 50% of which was counseling/coordinating care for renal cell carcinoma.

## 2018-01-09 ENCOUNTER — Other Ambulatory Visit: Payer: Medicare Other

## 2018-01-09 ENCOUNTER — Ambulatory Visit: Payer: Medicare Other

## 2018-01-09 ENCOUNTER — Ambulatory Visit: Payer: Medicare Other | Admitting: Hematology

## 2018-01-13 ENCOUNTER — Telehealth: Payer: Self-pay

## 2018-01-13 NOTE — Telephone Encounter (Signed)
Pt called to notify of need for filling scheduled for tomorrow. [name] wanting to confirm no bone or root involvement as this would interfere with administration of xgeva. Pt provided contact information for Bevelyn Ngo, DDS. Dentist out of the office today, but called [phone] and spoke with Assistant, Claiborne Billings. Based on pt description of chipped tooth, assumption is that root/bone would not be involved. Shared information regarding xgeva with assistant in the event that after assessment this changes. Explained importance of continuing xgeva for pt. Contact number also provided for office with questions or concerns from dentist tomorrow [phone].

## 2018-01-14 NOTE — Progress Notes (Signed)
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 The patient's last visit with [name] was on 12/26/17. She is accompanied today by her husband. The pt reports that she is doing well overall.   The pt reports that she tolerated her first dose of Nivolumab very well. She notes that her newly placed port site is a little tender but denies any redness or concerns for infection.   The pt notes that she is doing well with her recently increased 62mcg Levothyroxine and endorses improved energy levels.   The pt notes that she will return to care with her surgeon in November, and will continue to watch her gallbladder in the interim. She has maintained follow up with her cardiologist Dr. Sabra Heck as well, and no cardiac interventions are currently being planned. She continues to not have any symptoms from her gallbladder.   The pt notes that her nausea has relieved since stopping Sutent and that she has been eating much better.   Lab results today (01/15/18) of CBC w/diff, CMP, and Reticulocytes is as follows: all values are WNL except for RBC at 3.28, HGB at 10.4, HCT at 31.9, RDW at 16.9, Glucose at 152, BUN at 29, Creatinine at 1.47, AST at 11, GFR at 34. 01/15/18 LDH is WNL at 137  On review of systems, pt reports improved energy levels, resolved nausea, eating better, and denies skin rashes, diarrhea, nausea, abdominal pains, fevers, chills, night sweats, and any other symptoms.    MEDICAL HISTORY:     Hypertension Dyslipidemia Osteoarthritis Ex-smoker Coronary artery disease Thyroid disorder-was apparently on levothyroxine 25 g daily which has subsequently been discontinued . 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 Earley Brooke Colonoscopy 03/2016 Dr. Earley Brooke  SOCIAL HISTORY: Social History   Socioeconomic History  . Marital status: Married    Spouse name: Not on file  . Number of children: Not on file  . Years of education: Not on file  . Highest education level: Not on file  Occupational History  . Not on file  Social Needs  . Financial resource strain: Not on file  . Food insecurity:    Worry: Not on file    Inability: Not on file  . Transportation needs:    Medical: Not on file    Non-medical: Not on file  Tobacco Use  . Smoking status: Former Smoker    Years: 8.00    Last attempt to quit: 06/18/1964    Years since quitting: 53.6  . Smokeless tobacco: Never Used  Substance and Sexual Activity  . Alcohol use: No  . Drug use: No  . Sexual activity: Not on file  Lifestyle  . Physical activity:    Days per week: Not on file    Minutes per session: Not on file  . Stress: Not on file  Relationships  . Social connections:    Talks on phone: Not on file  Gets together: Not on file    Attends religious service: Not on file    Active member of club or organization: Not on file    Attends meetings of clubs or organizations: Not on file    Relationship status: Not on file  . Intimate partner violence:    Fear of current or ex partner: Not on file    Emotionally abused: Not on file    Physically abused: Not on file    Forced sexual activity: Not on file  Other Topics Concern  . Not on file  Social History Narrative  . Not on file   Patient lives in [place] Works as a Solicitor  part-time Ex-smoker quit long time ago In 1961 . Smoked 2 packs per day for 8 years prior to that .  or a lot of stress since her daughter is also getting treated for stage IV uterine cancer.  FAMILY HISTORY:  Mother deceased Father with asthma and emphysema died at 71 years with the MI. Brother deceased of heart disease   ALLERGIES:  is allergic to adhesive [tape]; nsaids; and statins.  MEDICATIONS:  Current Outpatient Medications  Medication Sig Dispense Refill  . acetaminophen (TYLENOL) 500 MG tablet Take 1,000 mg by mouth 2 (two) times daily.     Marland Kitchen amLODipine (NORVASC) 5 MG tablet Take 5 mg by mouth daily.  6  . atorvastatin (LIPITOR) 40 MG tablet Take 40 mg by mouth daily at 6 PM.     . carvedilol (COREG) 6.25 MG tablet Take 6.25 mg by mouth 2 (two) times daily.  6  . clobetasol ointment (TEMOVATE) 1.61 % Apply 1 application topically 2 (two) times daily.    . furosemide (LASIX) 20 MG tablet Take 20 mg by mouth daily.    . hydrocortisone (CORTEF) 10 MG tablet Take 15 mg by mouth daily.     Marland Kitchen levothyroxine (SYNTHROID, LEVOTHROID) 25 MCG tablet Take 50 mcg by mouth daily before breakfast.     . lidocaine-prilocaine (EMLA) cream Apply to affected area once 30 g 3  . ondansetron (ZOFRAN) 4 MG tablet Take 1 tablet (4 mg total) by mouth every 8 (eight) hours as needed for nausea. 30 tablet 2  . pantoprazole (PROTONIX) 20 MG tablet Take 20 mg by mouth daily.    . SUNItinib (SUTENT) 37.5 MG capsule 37.5 mg po daily 2 weeks on 1 week off 28 capsule 3  . traMADol (ULTRAM) 50 MG tablet TAKE 1/2 TO 1 (ONE-HALF TO ONE) TABLET BY MOUTH EVERY 6 HOURS AS NEEDED 40 tablet 0  . magic mouthwash w/lidocaine SOLN Take 5 mLs by mouth 4 (four) times daily as needed for mouth pain. Swish and spit. (1 Part viscous lidocaine 2% 1 Part Maalox 1 Part diphenhydramine 12.5 mg per 45mL elixir) (Patient not taking: Reported on 12/26/2017) 120 mL 1  . prochlorperazine (COMPAZINE) 10 MG tablet TAKE 1 TABLET BY MOUTH  EVERY 6 HOURS AS NEEDED FOR NAUSEA OR VOMITING (Patient not taking: Reported on 01/15/2018) 30 tablet 2   No current facility-administered medications for this visit.    Facility-Administered Medications Ordered in Other Visits  Medication Dose Route Frequency Provider Last Rate Last Dose  . denosumab (XGEVA) injection 120 mg  120 mg Subcutaneous Once Brunetta Genera, MD        REVIEW OF SYSTEMS:    A 10+ POINT REVIEW OF SYSTEMS WAS OBTAINED including neurology, dermatology, psychiatry, cardiac, respiratory, lymph, extremities, GI, GU, Musculoskeletal, constitutional, breasts, reproductive, HEENT.  All pertinent positives are noted in the HPI.  All others are negative.   PHYSICAL EXAMINATION:  ECOG PERFORMANCE STATUS: 1 - Symptomatic but completely ambulatory   Vitals:   01/15/18 1050  BP: (!) 165/63  Pulse: 65  Resp: 18  Temp: 98.2 F (36.8 C)  SpO2: 100%   Filed Weights   01/15/18 1050  Weight: 129 lb 8 oz (58.7 kg)   .Body mass index is 26.16 kg/m.   GENERAL:alert, in no acute distress and comfortable SKIN: no acute rashes, no significant lesions EYES: conjunctiva are pink and non-injected, sclera anicteric OROPHARYNX: MMM, no exudates, no oropharyngeal erythema or ulceration NECK: supple, no JVD LYMPH:  no palpable lymphadenopathy in the cervical, axillary or inguinal regions LUNGS: clear to auscultation b/l with normal respiratory effort HEART: regular rate & rhythm ABDOMEN:  normoactive bowel sounds , non tender, not distended. No palpable hepatosplenomegaly.  Extremity: no pedal edema PSYCH: alert & oriented x 3 with fluent speech NEURO: no focal motor/sensory deficits   LABORATORY DATA:  I have reviewed the data as listed  . CBC Latest Ref Rng & Units 01/15/2018 01/08/2018 12/26/2017  WBC 3.9 - 10.3 K/uL 6.3 6.3 7.3  Hemoglobin 11.6 - 15.9 g/dL 10.4(L) 10.0(L) 8.3(L)  Hematocrit 34.8 - 46.6 % 31.9(L) 32.1(L) 27.0(L)  Platelets 145 - 400 K/uL 301 461(H)  319    CBC    Component Value Date/Time   WBC 6.3 01/15/2018 0958   WBC 6.3 01/08/2018 0829   RBC 3.28 (L) 01/15/2018 0958   HGB 10.4 (L) 01/15/2018 0958   HGB 11.2 (L) 04/04/2017 0830   HCT 31.9 (L) 01/15/2018 0958   HCT 35.2 04/04/2017 0830   PLT 301 01/15/2018 0958   PLT 250 04/04/2017 0830   MCV 97.0 01/15/2018 0958   MCV 107.6 (H) 04/04/2017 0830   MCH 31.7 01/15/2018 0958   MCHC 32.7 01/15/2018 0958   RDW 16.9 (H) 01/15/2018 0958   RDW 14.0 04/04/2017 0830   LYMPHSABS 1.0 01/15/2018 0958   LYMPHSABS 1.6 04/04/2017 0830   MONOABS 0.6 01/15/2018 0958   MONOABS 0.4 04/04/2017 0830   EOSABS 0.1 01/15/2018 0958   EOSABS 0.1 04/04/2017 0830   BASOSABS 0.1 01/15/2018 0958   BASOSABS 0.0 04/04/2017 0830     . CMP Latest Ref Rng & Units 01/15/2018 01/08/2018 12/26/2017  Glucose 70 - 99 mg/dL 152(H) 163(H) 188(H)  BUN 8 - 23 mg/dL 29(H) 24(H) 26(H)  Creatinine 0.44 - 1.00 mg/dL 1.47(H) 1.29(H) 1.35(H)  Sodium 135 - 145 mmol/L 139 141 138  Potassium 3.5 - 5.1 mmol/L 4.0 4.2 4.2  Chloride 98 - 111 mmol/L 105 105 107  CO2 22 - 32 mmol/L 24 23 22   Calcium 8.9 - 10.3 mg/dL 9.7 9.4 8.9  Total Protein 6.5 - 8.1 g/dL 7.5 - 6.9  Total Bilirubin 0.3 - 1.2 mg/dL 0.4 - 0.5  Alkaline Phos 38 - 126 U/L 67 - 90  AST 15 - 41 U/L 11(L) - 17  ALT 0 - 44 U/L 10 - 38   B12 level 299 (OSH)-->455  . Lab Results  Component Value Date   LDH 137 01/15/2018         RADIOGRAPHIC STUDIES: I have personally reviewed the radiological images as listed and agreed with the findings in the report. Nm Pet Image Restag (ps) Skull Base To Thigh  Result Date: 01/03/2017 IMPRESSION: 1. There is a new lesion in the hepatic dome which is FDG avid and low in attenuation on CT  imaging consistent with metastatic disease. Another region of uptake in the left hepatic lobe posteriorly demonstrates no CT correlate. A second subtle metastasis is not excluded on today's study. An MRI could better assess for  other hepatic metastases if clinically warranted. 2. New metastatic lesion in the left side of T8. 3. New pulmonary nodule in the right upper lobe. This is too small to characterize but suspicious. Recommend attention on follow-up. No FDG avid nodules or other enlarging nodules. 4. The uptake at the previous left renal artery has almost resolved in the interval and is favored to be post therapeutic. Recommend attention on follow-up. 5. The metastasis in the posterior right hilum seen previously has resolved. Electronically Signed   By: Dorise Bullion III M.D   On: 01/03/2017 11:16   .Ir Imaging Guided Port Insertion  Result Date: 01/08/2018 INDICATION: 77 year old female with a history of renal cell carcinoma metastatic EXAM: IMPLANTED PORT A CATH PLACEMENT WITH ULTRASOUND AND FLUOROSCOPIC GUIDANCE MEDICATIONS: 2.0 g Ancef; The antibiotic was administered within an appropriate time interval prior to skin puncture. ANESTHESIA/SEDATION: Moderate (conscious) sedation was employed during this procedure. A total of Versed 2.0 mg and Fentanyl 100 mcg was administered intravenously. Moderate Sedation Time: 16 minutes. The patient's level of consciousness and vital signs were monitored continuously by radiology nursing throughout the procedure under my direct supervision. FLUOROSCOPY TIME:  0 minutes, 6 seconds (1 mGy) COMPLICATIONS: None PROCEDURE: The procedure, risks, benefits, and alternatives were explained to the patient. Questions regarding the procedure were encouraged and answered. The patient understands and consents to the procedure. Ultrasound survey was performed with images stored and sent to PACs. The right neck and chest was prepped with chlorhexidine, and draped in the usual sterile fashion using maximum barrier technique (cap and mask, sterile gown, sterile gloves, large sterile sheet, hand hygiene and cutaneous antiseptic). Antibiotic prophylaxis was provided with 2.0g Ancef administered IV one hour  prior to skin incision. Local anesthesia was attained by infiltration with 1% lidocaine without epinephrine. Ultrasound demonstrated patency of the right internal jugular vein, and this was documented with an image. Under real-time ultrasound guidance, this vein was accessed with a 21 gauge micropuncture needle and image documentation was performed. A small dermatotomy was made at the access site with an 11 scalpel. A 0.018" wire was advanced into the SVC and used to estimate the length of the internal catheter. The access needle exchanged for a 27F micropuncture vascular sheath. The 0.018" wire was then removed and a 0.035" wire advanced into the IVC. An appropriate location for the subcutaneous reservoir was selected below the clavicle and an incision was made through the skin and underlying soft tissues. The subcutaneous tissues were then dissected using a combination of blunt and sharp surgical technique and a pocket was formed. A single lumen power injectable portacatheter was then tunneled through the subcutaneous tissues from the pocket to the dermatotomy and the port reservoir placed within the subcutaneous pocket. The venous access site was then serially dilated and a peel away vascular sheath placed over the wire. The wire was removed and the port catheter advanced into position under fluoroscopic guidance. The catheter tip is positioned in the cavoatrial junction. This was documented with a spot image. The portacatheter was then tested and found to flush and aspirate well. The port was flushed with saline followed by 100 units/mL heparinized saline. The pocket was then closed in two layers using first subdermal inverted interrupted absorbable sutures followed by a running subcuticular suture. The  epidermis was then sealed with Dermabond. The dermatotomy at the venous access site was also seal with Dermabond. Patient tolerated the procedure well and remained hemodynamically stable throughout. No  complications encountered and no significant blood loss encountered IMPRESSION: Status post right IJ port catheter placement. Catheter ready for use. Signed, Dulcy Fanny. Dellia Nims, RPVI [facility] Electronically Signed   By: Corrie Mckusick D.O.   On: 01/08/2018 10:39    ASSESSMENT & PLAN:    77 y.o. 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 PET/CT 06/19/2017 -- consistent with partial metabolic response to treatment.  Rt adrenal gland bx - showed clear cell RCC  S/p CYtoreductive left radical nephrectomy and left adrenal gland resection on 08/01/2016 by Dr Alinda Money.  10/16/17 PET/CT revealed Continued improvement, with the T7 metastatic lesion no longer significantly hypermetabolic. The previous right lower lobe pulmonary nodule is even less apparent, perhaps about 2 mm in diameter today; given that this measured 6 mm on 03/28/2017 this probably represents an effectively treated metastatic lesion. Currently no appreciable hypermetabolic activity is identified to suggest active malignancy. Distended gallbladder with gallbladder wall thickening and gallstones. Correlate clinically in assessing for cholecystitis. Small but abnormal amount of free pelvic fluid, nonspecific. Other imaging findings of potential clinical significance: Chronic ethmoid sinusitis. Aortic Atherosclerosis. Stable 5 mm right middle lobe pulmonary nodule, not hypermetabolic but below sensitive PET-CT size thresholds. Left nephrectomy. Notable pelvic floor laxity with cystocele. Chronic bilateral Sacroiliitis.  #2 b/l adrenal metastases from [place] s/p left adrenalectomy with adrenal insuff - follows with Dr Buddy Duty.  #3 Small pulmonary lesions -- 10/16/17 PET/CT showed pulmonary less apparent than 03/28/17 PET/CT, decreasing from 73mm to 23mm diameter.    MRI brain shows no evidence of metastatic disease  #4 T7/8 Bone metastases - received Xgeva every 4 weeks from May 2018 to June 2019. 10/16/17 PET/CT revealed improvement with T7 metastatic lesion no longer significantly hypermetabolic.  -Holding Xgeva as of 11/19/17 unless indicated in the future  #5 ?liver mets- rpt PET/CT from 10/16/2017 shows no overt evidence of metastatic disease in the liver.  #6 Grade 1 Nausea - improved and intermittent. Hasnt used her anti-emetic as instructed so some nausea and decreased po intake.  #7 Grade 1 Diarrhea - from sutent controlled with prn Imodium.  #8 Hyponatremia - nearly resolved with sodium at 138 - likely related to some element of adrenal insufficiency, diarrhea, limited by mouth intake. Primarily solute free fluid intake.   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 Buddy Duty -TSH normalized on 11/19/17 to 3.141  #14 HTN - ?control. Patient tends to be anxious and has higher blood pressures in the clinic. She can have increased blood pressure from Sutent as  well. Plan: -continued close f/u with her PCP /cardiology regarding the many elements necessary for her care that are not directly related to her oncology care. -ACEI held due to AKI and  hyperkalemia - following with cardiology to mx this. Has been started on Amlodipine instead.  #15 Grade 1 mucositis- resolved -Advised the patient to continue use of Magic Mouthwash to aid with nutrition  #16 Newly diagnosed ?CAD - positive cardiac nuclear stress test Following with cardiology and nephrology ? Need for pre-operative cardiac cath  #17 CHolecystitis - on hold due to cardiac issues noted on pre-operative evaluation.  PLAN:  -Will hold Xgeva for now, given the interval response of T7 seen on the 10/16/17 PET/CT -Recommended that the pt optimize her nutritional status and hydration status -Have held Sutent with considerations of CAD and impending cholecystectomy  -Discussed pt labwork today, 01/15/18; HGB improved to 10.4, blood chemistries are stable. LDH continues to be normal.  -The pt has no prohibitive toxicities from continuing Nivolumab every 2 weeks at this time. -Will repeat imaging in 2-3 months -Will see the pt back in 4 weeks     Continue Nivolumab q2weeks with labs - please schedule next 4 doses RTC wirth Dr Irene Limbo in 4 weeks   All of the patients questions were answered with apparent satisfaction. The patient knows to call the clinic with any problems, questions or concerns.  The total time spent in the appt was 25 minutes and more than 50% was on counseling and direct patient cares.    Sullivan Lone MD MS AAHIVMS [facility] Hematology/Oncology Physician [facility]  (Office):       [phone] (Work cell):  [phone] (Fax):           [phone]  I, Baldwin [place], am acting as a scribe for [name]  .I have reviewed the above documentation for accuracy and completeness, and I agree with the above. Brunetta Genera MD

## 2018-01-15 ENCOUNTER — Other Ambulatory Visit: Payer: Medicare Other

## 2018-01-15 ENCOUNTER — Inpatient Hospital Stay: Payer: Medicare Other

## 2018-01-15 ENCOUNTER — Ambulatory Visit: Payer: Medicare Other | Admitting: Hematology

## 2018-01-15 ENCOUNTER — Other Ambulatory Visit: Payer: Self-pay

## 2018-01-15 ENCOUNTER — Inpatient Hospital Stay (HOSPITAL_BASED_OUTPATIENT_CLINIC_OR_DEPARTMENT_OTHER): Payer: Medicare Other | Admitting: Hematology

## 2018-01-15 ENCOUNTER — Telehealth: Payer: Self-pay

## 2018-01-15 VITALS — BP 165/63 | HR 65 | Temp 98.2°F | Resp 18 | Ht 59.0 in | Wt 129.5 lb

## 2018-01-15 DIAGNOSIS — E538 Deficiency of other specified B group vitamins: Secondary | ICD-10-CM

## 2018-01-15 DIAGNOSIS — E871 Hypo-osmolality and hyponatremia: Secondary | ICD-10-CM

## 2018-01-15 DIAGNOSIS — Z7189 Other specified counseling: Secondary | ICD-10-CM

## 2018-01-15 DIAGNOSIS — K8020 Calculus of gallbladder without cholecystitis without obstruction: Secondary | ICD-10-CM

## 2018-01-15 DIAGNOSIS — M461 Sacroiliitis, not elsewhere classified: Secondary | ICD-10-CM

## 2018-01-15 DIAGNOSIS — C642 Malignant neoplasm of left kidney, except renal pelvis: Secondary | ICD-10-CM | POA: Diagnosis not present

## 2018-01-15 DIAGNOSIS — Z5112 Encounter for antineoplastic immunotherapy: Secondary | ICD-10-CM | POA: Diagnosis not present

## 2018-01-15 DIAGNOSIS — E079 Disorder of thyroid, unspecified: Secondary | ICD-10-CM

## 2018-01-15 DIAGNOSIS — Z79899 Other long term (current) drug therapy: Secondary | ICD-10-CM

## 2018-01-15 DIAGNOSIS — Z87891 Personal history of nicotine dependence: Secondary | ICD-10-CM

## 2018-01-15 DIAGNOSIS — Z8049 Family history of malignant neoplasm of other genital organs: Secondary | ICD-10-CM

## 2018-01-15 DIAGNOSIS — C7951 Secondary malignant neoplasm of bone: Secondary | ICD-10-CM

## 2018-01-15 DIAGNOSIS — N8110 Cystocele, unspecified: Secondary | ICD-10-CM

## 2018-01-15 DIAGNOSIS — K449 Diaphragmatic hernia without obstruction or gangrene: Secondary | ICD-10-CM

## 2018-01-15 DIAGNOSIS — I2510 Atherosclerotic heart disease of native coronary artery without angina pectoris: Secondary | ICD-10-CM

## 2018-01-15 DIAGNOSIS — E2749 Other adrenocortical insufficiency: Secondary | ICD-10-CM

## 2018-01-15 DIAGNOSIS — C7800 Secondary malignant neoplasm of unspecified lung: Secondary | ICD-10-CM

## 2018-01-15 DIAGNOSIS — E119 Type 2 diabetes mellitus without complications: Secondary | ICD-10-CM

## 2018-01-15 DIAGNOSIS — E440 Moderate protein-calorie malnutrition: Secondary | ICD-10-CM

## 2018-01-15 DIAGNOSIS — I10 Essential (primary) hypertension: Secondary | ICD-10-CM

## 2018-01-15 DIAGNOSIS — Z905 Acquired absence of kidney: Secondary | ICD-10-CM

## 2018-01-15 DIAGNOSIS — I252 Old myocardial infarction: Secondary | ICD-10-CM

## 2018-01-15 DIAGNOSIS — M1990 Unspecified osteoarthritis, unspecified site: Secondary | ICD-10-CM

## 2018-01-15 DIAGNOSIS — I340 Nonrheumatic mitral (valve) insufficiency: Secondary | ICD-10-CM

## 2018-01-15 DIAGNOSIS — I700 Atherosclerosis of aorta: Secondary | ICD-10-CM

## 2018-01-15 DIAGNOSIS — K649 Unspecified hemorrhoids: Secondary | ICD-10-CM

## 2018-01-15 DIAGNOSIS — E785 Hyperlipidemia, unspecified: Secondary | ICD-10-CM

## 2018-01-15 DIAGNOSIS — N2889 Other specified disorders of kidney and ureter: Secondary | ICD-10-CM

## 2018-01-15 DIAGNOSIS — E875 Hyperkalemia: Secondary | ICD-10-CM

## 2018-01-15 LAB — CBC WITH DIFFERENTIAL (CANCER CENTER ONLY)
Basophils Absolute: 0.1 10*3/uL (ref 0.0–0.1)
Basophils Relative: 1 %
Eosinophils Absolute: 0.1 10*3/uL (ref 0.0–0.5)
Eosinophils Relative: 1 %
HCT: 31.9 % — ABNORMAL LOW (ref 34.8–46.6)
Hemoglobin: 10.4 g/dL — ABNORMAL LOW (ref 11.6–15.9)
Lymphocytes Relative: 15 %
Lymphs Abs: 1 10*3/uL (ref 0.9–3.3)
MCH: 31.7 pg (ref 25.1–34.0)
MCHC: 32.7 g/dL (ref 31.5–36.0)
MCV: 97 fL (ref 79.5–101.0)
Monocytes Absolute: 0.6 10*3/uL (ref 0.1–0.9)
Monocytes Relative: 10 %
Neutro Abs: 4.6 10*3/uL (ref 1.5–6.5)
Neutrophils Relative %: 73 %
Platelet Count: 301 10*3/uL (ref 145–400)
RBC: 3.28 MIL/uL — ABNORMAL LOW (ref 3.70–5.45)
RDW: 16.9 % — ABNORMAL HIGH (ref 11.2–14.5)
WBC Count: 6.3 10*3/uL (ref 3.9–10.3)

## 2018-01-15 LAB — CMP (CANCER CENTER ONLY)
ALT: 10 U/L (ref 0–44)
AST: 11 U/L — ABNORMAL LOW (ref 15–41)
Albumin: 4.1 g/dL (ref 3.5–5.0)
Alkaline Phosphatase: 67 U/L (ref 38–126)
Anion gap: 10 (ref 5–15)
BUN: 29 mg/dL — ABNORMAL HIGH (ref 8–23)
CO2: 24 mmol/L (ref 22–32)
Calcium: 9.7 mg/dL (ref 8.9–10.3)
Chloride: 105 mmol/L (ref 98–111)
Creatinine: 1.47 mg/dL — ABNORMAL HIGH (ref 0.44–1.00)
GFR, Est AFR Am: 39 mL/min — ABNORMAL LOW (ref >60)
GFR, Estimated: 34 mL/min — ABNORMAL LOW (ref >60)
Glucose, Bld: 152 mg/dL — ABNORMAL HIGH (ref 70–99)
Potassium: 4 mmol/L (ref 3.5–5.1)
Sodium: 139 mmol/L (ref 135–145)
Total Bilirubin: 0.4 mg/dL (ref 0.3–1.2)
Total Protein: 7.5 g/dL (ref 6.5–8.1)

## 2018-01-15 LAB — LACTATE DEHYDROGENASE: LDH: 137 U/L (ref 98–192)

## 2018-01-15 MED ORDER — SODIUM CHLORIDE 0.9 % IV SOLN
Freq: Once | INTRAVENOUS | Status: AC
  Administered 2018-01-15: 12:00:00 via INTRAVENOUS
  Filled 2018-01-15: qty 250

## 2018-01-15 MED ORDER — SODIUM CHLORIDE 0.9 % IV SOLN
240.0000 mg | Freq: Once | INTRAVENOUS | Status: AC
  Administered 2018-01-15: 240 mg via INTRAVENOUS
  Filled 2018-01-15: qty 24

## 2018-01-15 NOTE — Progress Notes (Signed)
Pt states that she doesn't want to have her port accessed and would rather get her opdivo through [facility].

## 2018-01-15 NOTE — Patient Instructions (Signed)
[facility] Discharge Instructions for Patients Receiving Chemotherapy  Today you received the following chemotherapy agents Opdivo.   To help prevent nausea and vomiting after your treatment, we encourage you to take your nausea medication as directed by your physician.   If you develop nausea and vomiting that is not controlled by your nausea medication, call the clinic.   BELOW ARE SYMPTOMS THAT SHOULD BE REPORTED IMMEDIATELY:  *FEVER GREATER THAN 100.5 F  *CHILLS WITH OR WITHOUT FEVER  NAUSEA AND VOMITING THAT IS NOT CONTROLLED WITH YOUR NAUSEA MEDICATION  *UNUSUAL SHORTNESS OF BREATH  *UNUSUAL BRUISING OR BLEEDING  TENDERNESS IN MOUTH AND THROAT WITH OR WITHOUT PRESENCE OF ULCERS  *URINARY PROBLEMS  *BOWEL PROBLEMS  UNUSUAL RASH Items with * indicate a potential emergency and should be followed up as soon as possible.  Feel free to call the clinic should you have any questions or concerns. The clinic phone number is [phone].  Please show the [facility] at check-in to the Emergency Department and triage nurse.  Nivolumab injection What is this medicine? NIVOLUMAB (nye VOL ue mab) is a monoclonal antibody. It is used to treat melanoma, lung cancer, kidney cancer, head and neck cancer, Hodgkin lymphoma, urothelial cancer, colon cancer, and liver cancer. This medicine may be used for other purposes; ask your health care provider or pharmacist if you have questions. COMMON BRAND NAME(S): Opdivo What should I tell my health care provider before I take this medicine? They need to know if you have any of these conditions: -diabetes -immune system problems -kidney disease -liver disease -lung disease -organ transplant -stomach or intestine problems -thyroid disease -an unusual or allergic reaction to nivolumab, other medicines, foods, dyes, or preservatives -pregnant or trying to get pregnant -breast-feeding How should I use this  medicine? This medicine is for infusion into a vein. It is given by a health care professional in a hospital or clinic setting. A special MedGuide will be given to you before each treatment. Be sure to read this information carefully each time. Talk to your pediatrician regarding the use of this medicine in children. While this drug may be prescribed for children as young as 12 years for selected conditions, precautions do apply. Overdosage: If you think you have taken too much of this medicine contact a poison control center or emergency room at once. NOTE: This medicine is only for you. Do not share this medicine with others. What if I miss a dose? It is important not to miss your dose. Call your doctor or health care professional if you are unable to keep an appointment. What may interact with this medicine? Interactions have not been studied. Give your health care provider a list of all the medicines, herbs, non-prescription drugs, or dietary supplements you use. Also tell them if you smoke, drink alcohol, or use illegal drugs. Some items may interact with your medicine. This list may not describe all possible interactions. Give your health care provider a list of all the medicines, herbs, non-prescription drugs, or dietary supplements you use. Also tell them if you smoke, drink alcohol, or use illegal drugs. Some items may interact with your medicine. What should I watch for while using this medicine? This drug may make you feel generally unwell. Continue your course of treatment even though you feel ill unless your doctor tells you to stop. You may need blood work done while you are taking this medicine. Do not become pregnant while taking this medicine or for  5 months after stopping it. Women should inform their doctor if they wish to become pregnant or think they might be pregnant. There is a potential for serious side effects to an unborn child. Talk to your health care professional or  pharmacist for more information. Do not breast-feed an infant while taking this medicine. What side effects may I notice from receiving this medicine? Side effects that you should report to your doctor or health care professional as soon as possible: -allergic reactions like skin rash, itching or hives, swelling of the face, lips, or tongue -black, tarry stools -blood in the urine -bloody or watery diarrhea -changes in vision -change in sex drive -changes in emotions or moods -chest pain -confusion -cough -decreased appetite -diarrhea -facial flushing -feeling faint or lightheaded -fever, chills -hair loss -hallucination, loss of contact with reality -headache -irritable -joint pain -loss of memory -muscle pain -muscle weakness -seizures -shortness of breath -signs and symptoms of high blood sugar such as dizziness; dry mouth; dry skin; fruity breath; nausea; stomach pain; increased hunger or thirst; increased urination -signs and symptoms of kidney injury like trouble passing urine or change in the amount of urine -signs and symptoms of liver injury like dark yellow or brown urine; general ill feeling or flu-like symptoms; light-colored stools; loss of appetite; nausea; right upper belly pain; unusually weak or tired; yellowing of the eyes or skin -stiff neck -swelling of the ankles, feet, hands -weight gain Side effects that usually do not require medical attention (report to your doctor or health care professional if they continue or are bothersome): -bone pain -constipation -tiredness -vomiting This list may not describe all possible side effects. Call your doctor for medical advice about side effects. You may report side effects to FDA at 1-800-FDA-1088. Where should I keep my medicine? This drug is given in a hospital or clinic and will not be stored at home. NOTE: This sheet is a summary. It may not cover all possible information. If you have questions about this  medicine, talk to your doctor, pharmacist, or health care provider.  2018 [employer] Standard (2016-01-15 17:49:34)    

## 2018-01-15 NOTE — Telephone Encounter (Signed)
Printed avs and calender of upcoming appointment. Per 9/26 los 

## 2018-01-20 IMAGING — MR MR HEAD WO/W CM
10 of 13 series · 34 of 48 positions shown · IV contrast (multihance)
Comparison: None.

CLINICAL DATA: Metastatic renal cell carcinoma.  Staging.

EXAM:
MRI HEAD WITHOUT AND WITH CONTRAST
TECHNIQUE: Multiplanar, multiecho pulse sequences of the brain and surrounding
structures were obtained without and with intravenous contrast.
CONTRAST:  11mL MULTIHANCE GADOBENATE DIMEGLUMINE 529 MG/ML IV SOLN

[Series 3: T1 · sagittal · 5.0mm · 0.47mm/px · 3 of 24 slices shown]
[im 1/24]
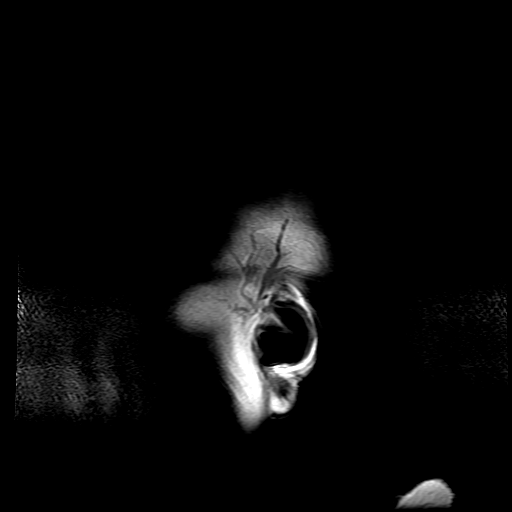
[im 12/24]
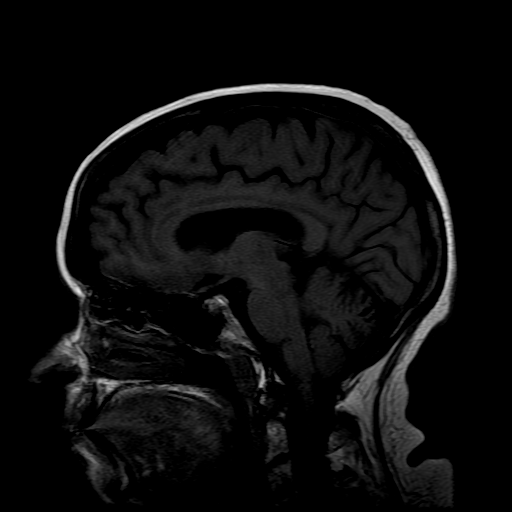
[im 24/24]
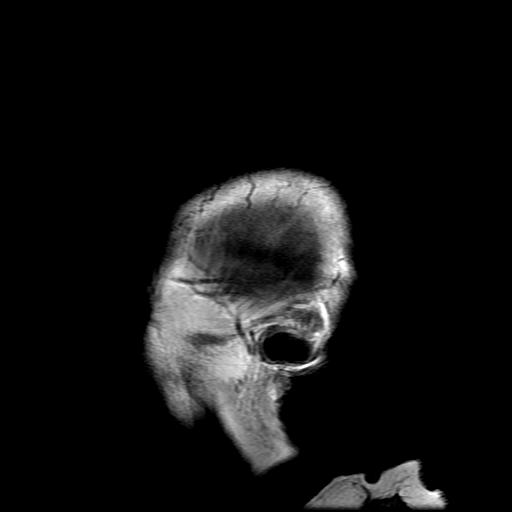

[Series 4: DWI · axial · 3.0mm · 1.09mm/px · z∈[-59,+88]mm · 8 of 100 slices shown (1 of 4)]
[im 1/100]
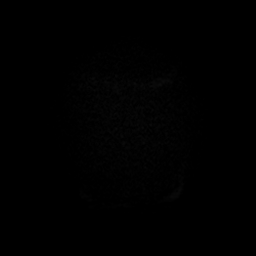
[im 12/100]
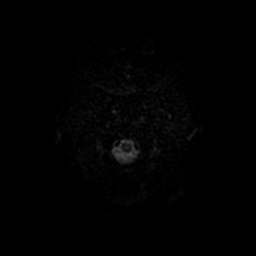
[im 34/100]
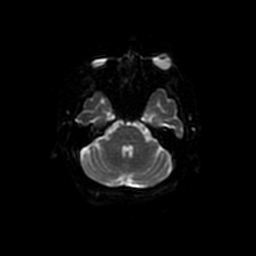
[im 45/100]
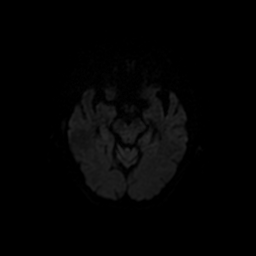
[im 56/100]
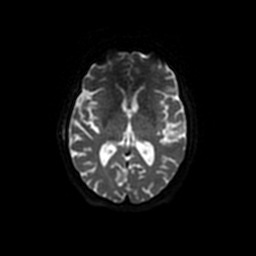
[im 67/100]
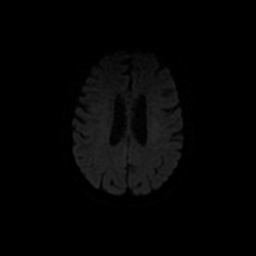
[im 89/100]
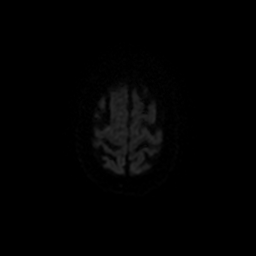
[im 100/100]
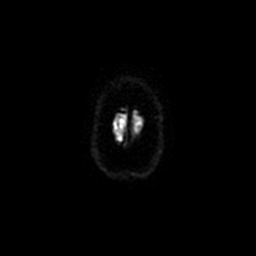

[Series 5: DWI · coronal · 5.0mm · 1.09mm/px · 6 of 66 slices shown (2 of 4)]
[im 1/66]
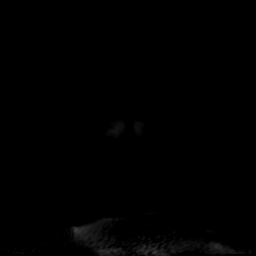
[im 14/66]
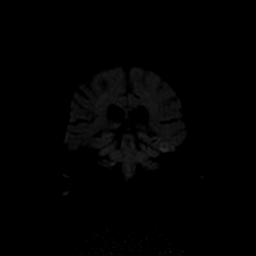
[im 27/66]
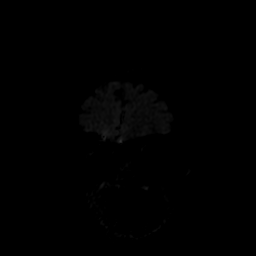
[im 40/66]
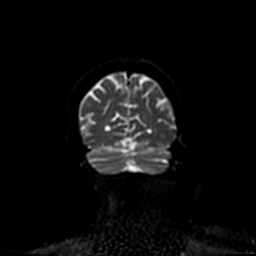
[im 53/66]
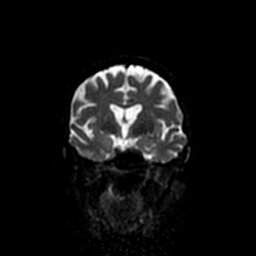
[im 66/66]
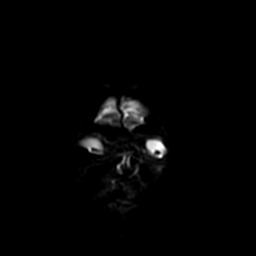

[Series 6: T2 · axial · 5.0mm · 0.43mm/px · z∈[-52,+98]mm · 2 of 24 slices shown]
[im 1/24]
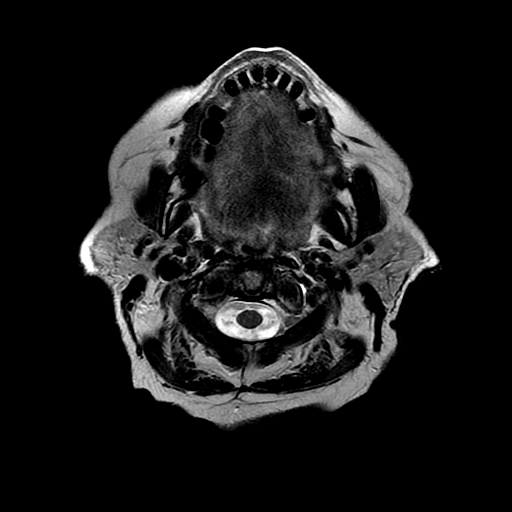
[im 24/24]
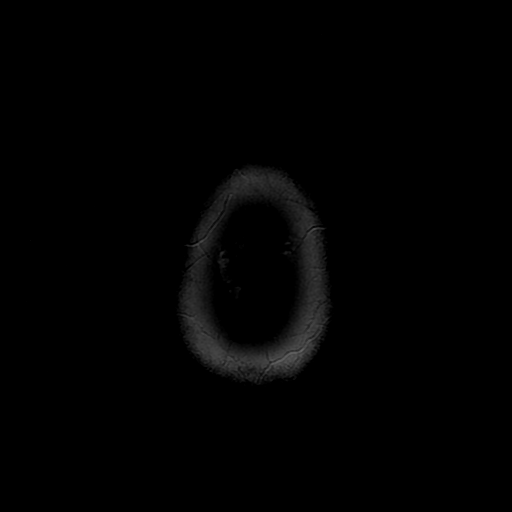

[Series 7: FLAIR · axial · 5.0mm · 0.43mm/px · z∈[-57,+103]mm · 2 of 24 slices shown]
[im 1/24]
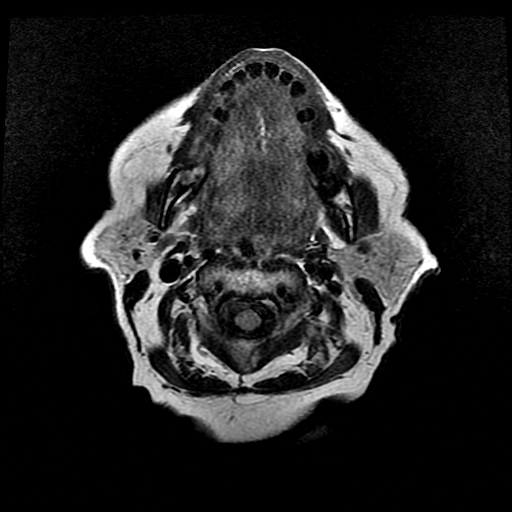
[im 24/24]
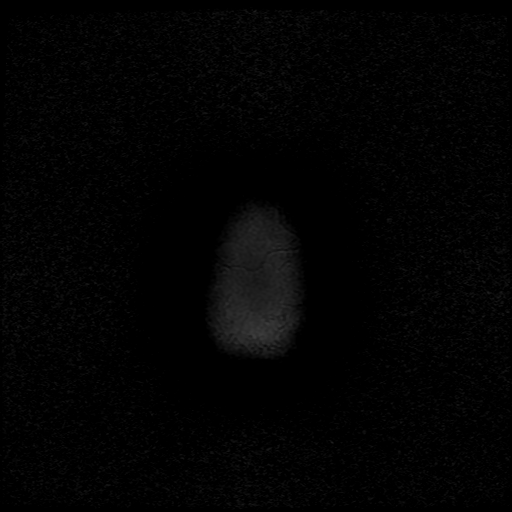

[Series 10: T2 post-contrast · coronal · 5.0mm · 0.45mm/px · 2 of 24 slices shown]
[im 1/24]
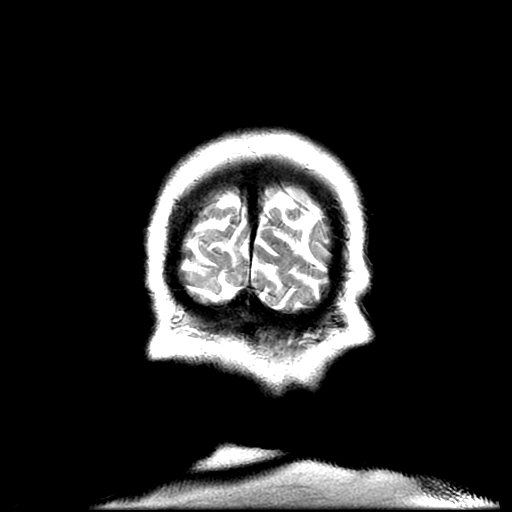
[im 24/24]
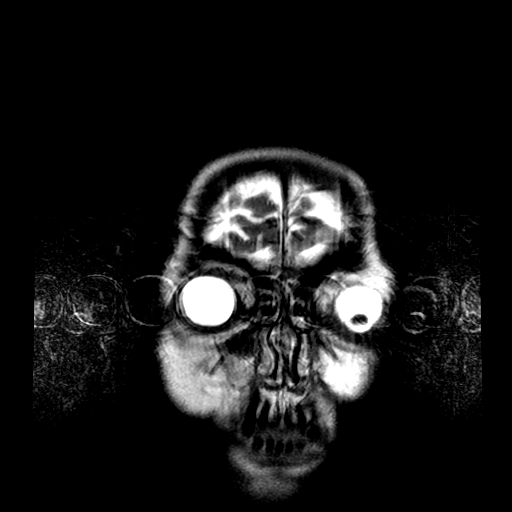

[Series 12: T1 post-contrast · coronal · 5.0mm · 0.45mm/px · 2 of 24 slices shown (1 of 2)]
[im 1/24]
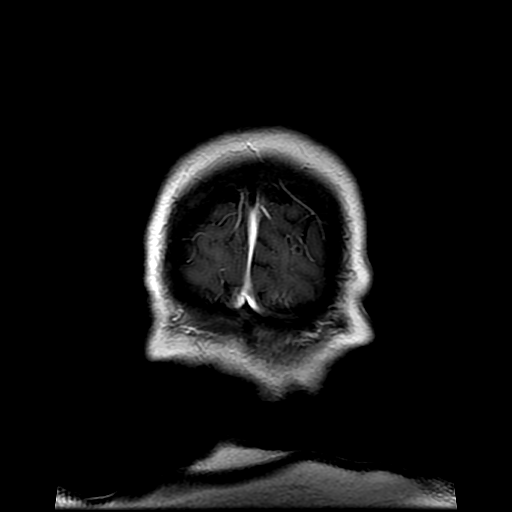
[im 24/24]
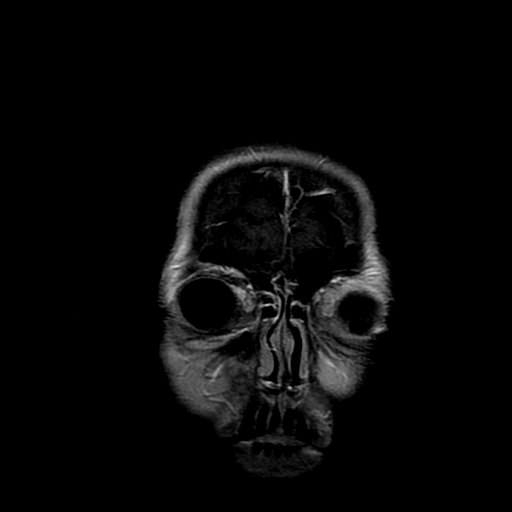

[Series 13: T1 post-contrast · sagittal · 5.0mm · 0.47mm/px · 2 of 24 slices shown (2 of 2)]
[im 1/24]
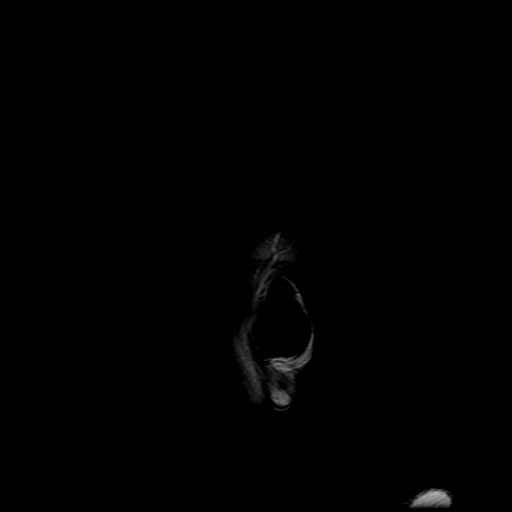
[im 24/24]
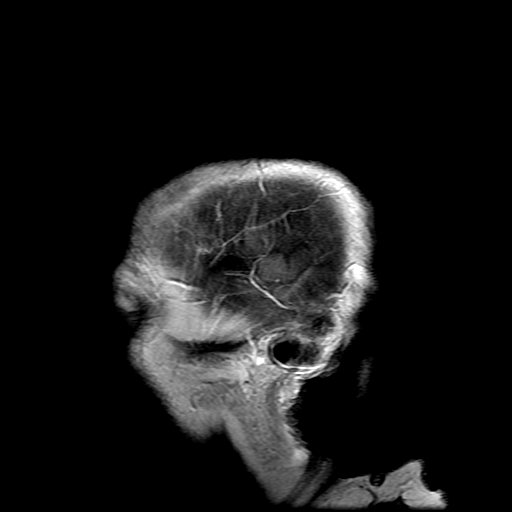

[Series 400: DWI · axial · 3.0mm · 1.09mm/px · z∈[-59,+88]mm · 4 of 50 slices shown (3 of 4)]
[im 1/50]
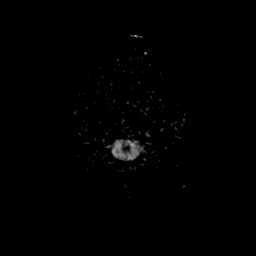
[im 17/50]
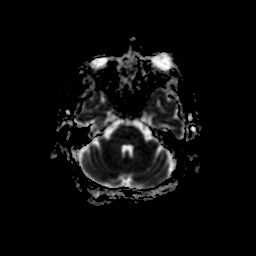
[im 33/50]
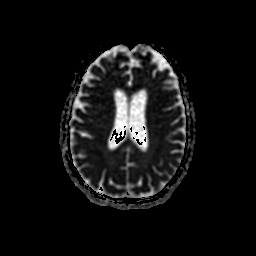
[im 50/50]
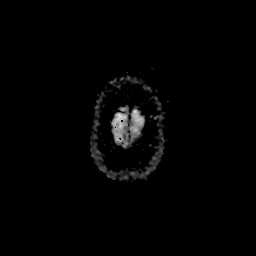

[Series 500: DWI · coronal · 5.0mm · 1.09mm/px · 3 of 33 slices shown (4 of 4)]
[im 1/33]
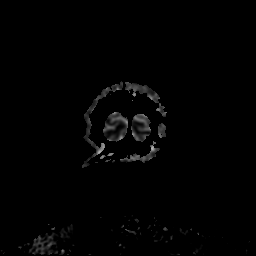
[im 17/33]
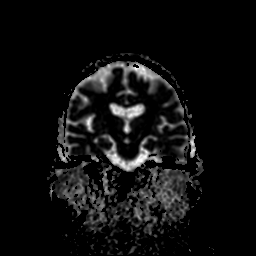
[im 33/33]
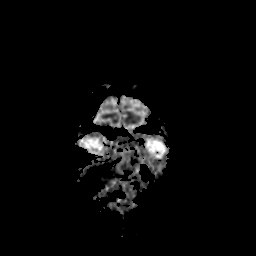

[34 of 48 positions shown; findings below may reference images not displayed]

FINDINGS: Brain: Mild atrophy. Negative for hydrocephalus. Negative for acute
or chronic infarction. Negative for hemorrhage or mass

Normal enhancement postcontrast administration. No evidence of
metastatic deposits in the brain.

Vascular: Normal arterial flow void.

Skull and upper cervical spine: Negative

Sinuses/Orbits: Negative

Other: None
IMPRESSION: Negative for metastatic disease to the brain.  No acute abnormality.

## 2018-01-30 ENCOUNTER — Other Ambulatory Visit: Payer: Self-pay

## 2018-01-30 ENCOUNTER — Telehealth: Payer: Self-pay

## 2018-01-30 ENCOUNTER — Other Ambulatory Visit: Payer: Self-pay | Admitting: *Deleted

## 2018-01-30 ENCOUNTER — Inpatient Hospital Stay: Payer: Medicare Other | Attending: Hematology

## 2018-01-30 ENCOUNTER — Inpatient Hospital Stay: Payer: Medicare Other

## 2018-01-30 VITALS — BP 184/75 | HR 64 | Temp 97.9°F | Resp 17

## 2018-01-30 DIAGNOSIS — C642 Malignant neoplasm of left kidney, except renal pelvis: Secondary | ICD-10-CM

## 2018-01-30 DIAGNOSIS — M1990 Unspecified osteoarthritis, unspecified site: Secondary | ICD-10-CM | POA: Insufficient documentation

## 2018-01-30 DIAGNOSIS — E440 Moderate protein-calorie malnutrition: Secondary | ICD-10-CM | POA: Diagnosis not present

## 2018-01-30 DIAGNOSIS — M461 Sacroiliitis, not elsewhere classified: Secondary | ICD-10-CM | POA: Diagnosis not present

## 2018-01-30 DIAGNOSIS — E875 Hyperkalemia: Secondary | ICD-10-CM | POA: Insufficient documentation

## 2018-01-30 DIAGNOSIS — K819 Cholecystitis, unspecified: Secondary | ICD-10-CM | POA: Diagnosis not present

## 2018-01-30 DIAGNOSIS — K449 Diaphragmatic hernia without obstruction or gangrene: Secondary | ICD-10-CM | POA: Insufficient documentation

## 2018-01-30 DIAGNOSIS — E785 Hyperlipidemia, unspecified: Secondary | ICD-10-CM | POA: Insufficient documentation

## 2018-01-30 DIAGNOSIS — C7951 Secondary malignant neoplasm of bone: Secondary | ICD-10-CM | POA: Diagnosis not present

## 2018-01-30 DIAGNOSIS — Z87891 Personal history of nicotine dependence: Secondary | ICD-10-CM | POA: Insufficient documentation

## 2018-01-30 DIAGNOSIS — Z5112 Encounter for antineoplastic immunotherapy: Secondary | ICD-10-CM | POA: Insufficient documentation

## 2018-01-30 DIAGNOSIS — E119 Type 2 diabetes mellitus without complications: Secondary | ICD-10-CM | POA: Insufficient documentation

## 2018-01-30 DIAGNOSIS — I10 Essential (primary) hypertension: Secondary | ICD-10-CM | POA: Insufficient documentation

## 2018-01-30 DIAGNOSIS — I2510 Atherosclerotic heart disease of native coronary artery without angina pectoris: Secondary | ICD-10-CM | POA: Diagnosis not present

## 2018-01-30 DIAGNOSIS — K649 Unspecified hemorrhoids: Secondary | ICD-10-CM | POA: Insufficient documentation

## 2018-01-30 DIAGNOSIS — C7800 Secondary malignant neoplasm of unspecified lung: Secondary | ICD-10-CM | POA: Insufficient documentation

## 2018-01-30 DIAGNOSIS — C9000 Multiple myeloma not having achieved remission: Secondary | ICD-10-CM

## 2018-01-30 DIAGNOSIS — Z905 Acquired absence of kidney: Secondary | ICD-10-CM | POA: Insufficient documentation

## 2018-01-30 DIAGNOSIS — N8110 Cystocele, unspecified: Secondary | ICD-10-CM | POA: Diagnosis not present

## 2018-01-30 DIAGNOSIS — E079 Disorder of thyroid, unspecified: Secondary | ICD-10-CM | POA: Insufficient documentation

## 2018-01-30 DIAGNOSIS — E538 Deficiency of other specified B group vitamins: Secondary | ICD-10-CM | POA: Diagnosis not present

## 2018-01-30 DIAGNOSIS — C7972 Secondary malignant neoplasm of left adrenal gland: Secondary | ICD-10-CM | POA: Diagnosis not present

## 2018-01-30 DIAGNOSIS — E039 Hypothyroidism, unspecified: Secondary | ICD-10-CM | POA: Insufficient documentation

## 2018-01-30 DIAGNOSIS — Z95828 Presence of other vascular implants and grafts: Secondary | ICD-10-CM | POA: Insufficient documentation

## 2018-01-30 DIAGNOSIS — I252 Old myocardial infarction: Secondary | ICD-10-CM | POA: Insufficient documentation

## 2018-01-30 DIAGNOSIS — Z7189 Other specified counseling: Secondary | ICD-10-CM

## 2018-01-30 DIAGNOSIS — I129 Hypertensive chronic kidney disease with stage 1 through stage 4 chronic kidney disease, or unspecified chronic kidney disease: Secondary | ICD-10-CM | POA: Diagnosis not present

## 2018-01-30 DIAGNOSIS — I340 Nonrheumatic mitral (valve) insufficiency: Secondary | ICD-10-CM | POA: Diagnosis not present

## 2018-01-30 DIAGNOSIS — R197 Diarrhea, unspecified: Secondary | ICD-10-CM | POA: Diagnosis not present

## 2018-01-30 DIAGNOSIS — Z79899 Other long term (current) drug therapy: Secondary | ICD-10-CM | POA: Insufficient documentation

## 2018-01-30 LAB — CBC WITH DIFFERENTIAL (CANCER CENTER ONLY)
Abs Immature Granulocytes: 0.04 10*3/uL (ref 0.00–0.07)
Basophils Absolute: 0 10*3/uL (ref 0.0–0.1)
Basophils Relative: 1 %
Eosinophils Absolute: 0.2 10*3/uL (ref 0.0–0.5)
Eosinophils Relative: 3 %
HCT: 32.1 % — ABNORMAL LOW (ref 36.0–46.0)
Hemoglobin: 10.1 g/dL — ABNORMAL LOW (ref 12.0–15.0)
Immature Granulocytes: 1 %
Lymphocytes Relative: 21 %
Lymphs Abs: 1.1 10*3/uL (ref 0.7–4.0)
MCH: 30.5 pg (ref 26.0–34.0)
MCHC: 31.5 g/dL (ref 30.0–36.0)
MCV: 97 fL (ref 80.0–100.0)
Monocytes Absolute: 0.6 10*3/uL (ref 0.1–1.0)
Monocytes Relative: 11 %
Neutro Abs: 3.4 10*3/uL (ref 1.7–7.7)
Neutrophils Relative %: 63 %
Platelet Count: 189 10*3/uL (ref 150–400)
RBC: 3.31 MIL/uL — ABNORMAL LOW (ref 3.87–5.11)
RDW: 14.7 % (ref 11.5–15.5)
WBC Count: 5.4 10*3/uL (ref 4.0–10.5)
nRBC: 0 % (ref 0.0–0.2)

## 2018-01-30 LAB — COMPREHENSIVE METABOLIC PANEL
ALT: 21 U/L (ref 0–44)
AST: 18 U/L (ref 15–41)
Albumin: 3.7 g/dL (ref 3.5–5.0)
Alkaline Phosphatase: 52 U/L (ref 38–126)
Anion gap: 10 (ref 5–15)
BUN: 23 mg/dL (ref 8–23)
CO2: 23 mmol/L (ref 22–32)
Calcium: 9.4 mg/dL (ref 8.9–10.3)
Chloride: 108 mmol/L (ref 98–111)
Creatinine, Ser: 1.32 mg/dL — ABNORMAL HIGH (ref 0.44–1.00)
GFR calc Af Amer: 45 mL/min — ABNORMAL LOW (ref >60)
GFR calc non Af Amer: 38 mL/min — ABNORMAL LOW (ref >60)
Glucose, Bld: 148 mg/dL — ABNORMAL HIGH (ref 70–99)
Potassium: 3.9 mmol/L (ref 3.5–5.1)
Sodium: 141 mmol/L (ref 135–145)
Total Bilirubin: 0.4 mg/dL (ref 0.3–1.2)
Total Protein: 6.5 g/dL (ref 6.5–8.1)

## 2018-01-30 LAB — TSH: TSH: 0.985 u[IU]/mL (ref 0.308–3.960)

## 2018-01-30 MED ORDER — SODIUM CHLORIDE 0.9% FLUSH
10.0000 mL | Freq: Once | INTRAVENOUS | Status: AC
  Administered 2018-01-30: 10 mL
  Filled 2018-01-30: qty 10

## 2018-01-30 MED ORDER — SODIUM CHLORIDE 0.9 % IV SOLN
Freq: Once | INTRAVENOUS | Status: AC
  Administered 2018-01-30: 09:00:00 via INTRAVENOUS
  Filled 2018-01-30: qty 250

## 2018-01-30 MED ORDER — SODIUM CHLORIDE 0.9 % IV SOLN
240.0000 mg | Freq: Once | INTRAVENOUS | Status: AC
  Administered 2018-01-30: 240 mg via INTRAVENOUS
  Filled 2018-01-30: qty 24

## 2018-01-30 MED ORDER — HEPARIN SOD (PORK) LOCK FLUSH 100 UNIT/ML IV SOLN
500.0000 [IU] | Freq: Once | INTRAVENOUS | Status: AC | PRN
  Administered 2018-01-30: 500 [IU]
  Filled 2018-01-30: qty 5

## 2018-01-30 MED ORDER — SODIUM CHLORIDE 0.9% FLUSH
10.0000 mL | INTRAVENOUS | Status: DC | PRN
  Administered 2018-01-30: 10 mL
  Filled 2018-01-30: qty 10

## 2018-01-30 NOTE — Telephone Encounter (Signed)
Pt request for TSH to be run today, ok per [name]. Order placed and laboratory notified to run. Document required for pt opportunity [place] the beach. Document completed and mailed to address provided by pt. Called pt to inform of document and lab add.

## 2018-01-30 NOTE — Progress Notes (Signed)
Per Aldona Bar desk RN for Dr Irene Limbo, Dr Irene Limbo does not want a TSH drawn today.

## 2018-01-30 NOTE — Patient Instructions (Signed)
[facility] Discharge Instructions for Patients Receiving Chemotherapy  Today you received the following chemotherapy agents Opdivo.   To help prevent nausea and vomiting after your treatment, we encourage you to take your nausea medication as directed by your physician.   If you develop nausea and vomiting that is not controlled by your nausea medication, call the clinic.   BELOW ARE SYMPTOMS THAT SHOULD BE REPORTED IMMEDIATELY:  *FEVER GREATER THAN 100.5 F  *CHILLS WITH OR WITHOUT FEVER  NAUSEA AND VOMITING THAT IS NOT CONTROLLED WITH YOUR NAUSEA MEDICATION  *UNUSUAL SHORTNESS OF BREATH  *UNUSUAL BRUISING OR BLEEDING  TENDERNESS IN MOUTH AND THROAT WITH OR WITHOUT PRESENCE OF ULCERS  *URINARY PROBLEMS  *BOWEL PROBLEMS  UNUSUAL RASH Items with * indicate a potential emergency and should be followed up as soon as possible.  Feel free to call the clinic should you have any questions or concerns. The clinic phone number is [phone].

## 2018-02-13 ENCOUNTER — Telehealth: Payer: Self-pay | Admitting: Hematology

## 2018-02-13 ENCOUNTER — Inpatient Hospital Stay: Payer: Medicare Other

## 2018-02-13 ENCOUNTER — Inpatient Hospital Stay (HOSPITAL_BASED_OUTPATIENT_CLINIC_OR_DEPARTMENT_OTHER): Payer: Medicare Other | Admitting: Hematology

## 2018-02-13 ENCOUNTER — Encounter: Payer: Self-pay | Admitting: Hematology

## 2018-02-13 ENCOUNTER — Other Ambulatory Visit: Payer: Self-pay | Admitting: *Deleted

## 2018-02-13 VITALS — BP 160/68 | HR 61 | Temp 97.6°F | Resp 18 | Ht 59.0 in | Wt 132.8 lb

## 2018-02-13 DIAGNOSIS — I252 Old myocardial infarction: Secondary | ICD-10-CM

## 2018-02-13 DIAGNOSIS — E119 Type 2 diabetes mellitus without complications: Secondary | ICD-10-CM

## 2018-02-13 DIAGNOSIS — C642 Malignant neoplasm of left kidney, except renal pelvis: Secondary | ICD-10-CM | POA: Diagnosis not present

## 2018-02-13 DIAGNOSIS — E875 Hyperkalemia: Secondary | ICD-10-CM

## 2018-02-13 DIAGNOSIS — E785 Hyperlipidemia, unspecified: Secondary | ICD-10-CM

## 2018-02-13 DIAGNOSIS — C7800 Secondary malignant neoplasm of unspecified lung: Secondary | ICD-10-CM | POA: Diagnosis not present

## 2018-02-13 DIAGNOSIS — Z5112 Encounter for antineoplastic immunotherapy: Secondary | ICD-10-CM | POA: Diagnosis not present

## 2018-02-13 DIAGNOSIS — Z79899 Other long term (current) drug therapy: Secondary | ICD-10-CM

## 2018-02-13 DIAGNOSIS — C7951 Secondary malignant neoplasm of bone: Secondary | ICD-10-CM

## 2018-02-13 DIAGNOSIS — I340 Nonrheumatic mitral (valve) insufficiency: Secondary | ICD-10-CM

## 2018-02-13 DIAGNOSIS — K449 Diaphragmatic hernia without obstruction or gangrene: Secondary | ICD-10-CM

## 2018-02-13 DIAGNOSIS — I129 Hypertensive chronic kidney disease with stage 1 through stage 4 chronic kidney disease, or unspecified chronic kidney disease: Secondary | ICD-10-CM

## 2018-02-13 DIAGNOSIS — E039 Hypothyroidism, unspecified: Secondary | ICD-10-CM

## 2018-02-13 DIAGNOSIS — K819 Cholecystitis, unspecified: Secondary | ICD-10-CM

## 2018-02-13 DIAGNOSIS — K649 Unspecified hemorrhoids: Secondary | ICD-10-CM

## 2018-02-13 DIAGNOSIS — E440 Moderate protein-calorie malnutrition: Secondary | ICD-10-CM

## 2018-02-13 DIAGNOSIS — Z87891 Personal history of nicotine dependence: Secondary | ICD-10-CM

## 2018-02-13 DIAGNOSIS — I10 Essential (primary) hypertension: Secondary | ICD-10-CM

## 2018-02-13 DIAGNOSIS — E538 Deficiency of other specified B group vitamins: Secondary | ICD-10-CM

## 2018-02-13 DIAGNOSIS — C7972 Secondary malignant neoplasm of left adrenal gland: Secondary | ICD-10-CM

## 2018-02-13 DIAGNOSIS — Z7189 Other specified counseling: Secondary | ICD-10-CM

## 2018-02-13 DIAGNOSIS — N8110 Cystocele, unspecified: Secondary | ICD-10-CM

## 2018-02-13 DIAGNOSIS — M461 Sacroiliitis, not elsewhere classified: Secondary | ICD-10-CM

## 2018-02-13 DIAGNOSIS — M129 Arthropathy, unspecified: Secondary | ICD-10-CM

## 2018-02-13 DIAGNOSIS — M1990 Unspecified osteoarthritis, unspecified site: Secondary | ICD-10-CM

## 2018-02-13 DIAGNOSIS — R197 Diarrhea, unspecified: Secondary | ICD-10-CM

## 2018-02-13 DIAGNOSIS — I2510 Atherosclerotic heart disease of native coronary artery without angina pectoris: Secondary | ICD-10-CM

## 2018-02-13 DIAGNOSIS — Z905 Acquired absence of kidney: Secondary | ICD-10-CM

## 2018-02-13 DIAGNOSIS — E079 Disorder of thyroid, unspecified: Secondary | ICD-10-CM

## 2018-02-13 LAB — CBC WITH DIFFERENTIAL (CANCER CENTER ONLY)
Abs Immature Granulocytes: 0.07 10*3/uL (ref 0.00–0.07)
Basophils Absolute: 0.1 10*3/uL (ref 0.0–0.1)
Basophils Relative: 1 %
Eosinophils Absolute: 0.3 10*3/uL (ref 0.0–0.5)
Eosinophils Relative: 4 %
HCT: 35 % — ABNORMAL LOW (ref 36.0–46.0)
Hemoglobin: 10.8 g/dL — ABNORMAL LOW (ref 12.0–15.0)
Immature Granulocytes: 1 %
Lymphocytes Relative: 18 %
Lymphs Abs: 1.4 10*3/uL (ref 0.7–4.0)
MCH: 29.7 pg (ref 26.0–34.0)
MCHC: 30.9 g/dL (ref 30.0–36.0)
MCV: 96.2 fL (ref 80.0–100.0)
Monocytes Absolute: 0.9 10*3/uL (ref 0.1–1.0)
Monocytes Relative: 11 %
Neutro Abs: 5.2 10*3/uL (ref 1.7–7.7)
Neutrophils Relative %: 65 %
Platelet Count: 234 10*3/uL (ref 150–400)
RBC: 3.64 MIL/uL — ABNORMAL LOW (ref 3.87–5.11)
RDW: 14.4 % (ref 11.5–15.5)
WBC Count: 7.9 10*3/uL (ref 4.0–10.5)
nRBC: 0 % (ref 0.0–0.2)

## 2018-02-13 LAB — CMP (CANCER CENTER ONLY)
ALT: 31 U/L (ref 0–44)
AST: 22 U/L (ref 15–41)
Albumin: 3.8 g/dL (ref 3.5–5.0)
Alkaline Phosphatase: 60 U/L (ref 38–126)
Anion gap: 12 (ref 5–15)
BUN: 30 mg/dL — ABNORMAL HIGH (ref 8–23)
CO2: 25 mmol/L (ref 22–32)
Calcium: 9.4 mg/dL (ref 8.9–10.3)
Chloride: 103 mmol/L (ref 98–111)
Creatinine: 1.71 mg/dL — ABNORMAL HIGH (ref 0.44–1.00)
GFR, Est AFR Am: 33 mL/min — ABNORMAL LOW (ref >60)
GFR, Estimated: 28 mL/min — ABNORMAL LOW (ref >60)
Glucose, Bld: 147 mg/dL — ABNORMAL HIGH (ref 70–99)
Potassium: 4.7 mmol/L (ref 3.5–5.1)
Sodium: 140 mmol/L (ref 135–145)
Total Bilirubin: 0.5 mg/dL (ref 0.3–1.2)
Total Protein: 6.7 g/dL (ref 6.5–8.1)

## 2018-02-13 LAB — TSH: TSH: 3.735 u[IU]/mL (ref 0.308–3.960)

## 2018-02-13 LAB — LACTATE DEHYDROGENASE: LDH: 133 U/L (ref 98–192)

## 2018-02-13 MED ORDER — SODIUM CHLORIDE 0.9 % IV SOLN
Freq: Once | INTRAVENOUS | Status: AC
  Administered 2018-02-13: 12:00:00 via INTRAVENOUS
  Filled 2018-02-13: qty 250

## 2018-02-13 MED ORDER — SODIUM CHLORIDE 0.9% FLUSH
10.0000 mL | Freq: Once | INTRAVENOUS | Status: AC
  Administered 2018-02-13: 10 mL
  Filled 2018-02-13: qty 10

## 2018-02-13 MED ORDER — HEPARIN SOD (PORK) LOCK FLUSH 100 UNIT/ML IV SOLN
500.0000 [IU] | Freq: Once | INTRAVENOUS | Status: AC | PRN
  Administered 2018-02-13: 500 [IU]
  Filled 2018-02-13: qty 5

## 2018-02-13 MED ORDER — TRAMADOL HCL 50 MG PO TABS: ORAL_TABLET | ORAL | 0 refills | Status: DC

## 2018-02-13 MED ORDER — SODIUM CHLORIDE 0.9 % IV SOLN
240.0000 mg | Freq: Once | INTRAVENOUS | Status: AC
  Administered 2018-02-13: 240 mg via INTRAVENOUS
  Filled 2018-02-13: qty 24

## 2018-02-13 MED ORDER — SODIUM CHLORIDE 0.9% FLUSH
10.0000 mL | INTRAVENOUS | Status: DC | PRN
  Administered 2018-02-13: 10 mL
  Filled 2018-02-13: qty 10

## 2018-02-13 NOTE — Progress Notes (Signed)
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 The patient's last visit with [name] was on 01/15/18. She is accompanied today by her husband. The pt reports that she is doing well overall.   The pt reports that her back muscles and intercostal muscles have been hurting more, as has her right arm. The pt denies a specific spot in her back being painful. She notes that her arthritis has been painful recently as well. The pt denies any injuries or activities that she associates with her muscle pain. However, the pt notes that she has been more active recently.   The pt notes that her skin has been itchy but denies any skin rashes, and notes that she takes very hot showers. The pt also denies nausea or diarrhea.   Lab results today (02/13/18) of CBC w/diff, CMP is as follows: all values are WNL except for RBC at 3.64, HGB at 10.8, HCT at 35.0, Glucose at 147, BUN at 30, Creatinine at 1.71, GFR at 28. 02/13/18 LDH at 133 02/13/18 TSH at 3.755  On review of systems, pt reports muscle pain, itching, keeping active, and denies specific back pain, skin rashes, diarrhea, nausea, chest pain, difficulty breathing, abdominal pains, and any other symptoms.    MEDICAL HISTORY:    Hypertension Dyslipidemia Osteoarthritis Ex-smoker Coronary artery disease Thyroid disorder-was apparently on levothyroxine 25 g daily which has subsequently been discontinued . Diabetes Mitral regurgitation B12 deficiency  hiatal hernia with esophagitis    Myocardial infarction in 1991 no interventions   SURGICAL HISTORY: No reported past surgeries EGD 05/01/2016 Dr. Earley Brooke Colonoscopy 03/2016 Dr. Earley Brooke  SOCIAL HISTORY: Social History   Socioeconomic History  . Marital status: Married    Spouse name: Not on file  . Number of children: Not on file  . Years of education: Not on file  . Highest education level: Not on file  Occupational History  . Not on file  Social Needs  . Financial resource strain: Not on file  . Food insecurity:    Worry: Not on file    Inability: Not on file  . Transportation needs:    Medical: Not on file    Non-medical: Not on file  Tobacco Use  . Smoking status: Former Smoker    Years: 8.00    Last attempt to quit: 06/18/1964    Years since quitting: 53.6  . Smokeless tobacco: Never Used  Substance and Sexual Activity  . Alcohol use: No  . Drug use: No  . Sexual activity: Not on file  Lifestyle  . Physical activity:    Days per week: Not on file    Minutes per session: Not on file  . Stress: Not on file  Relationships  . Social connections:    Talks on phone: Not on file    Gets together: Not on file    Attends religious service: Not on file    Active member of club or organization: Not on file    Attends meetings of clubs or organizations: Not  on file    Relationship status: Not on file  . Intimate partner violence:    Fear of current or ex partner: Not on file    Emotionally abused: Not on file    Physically abused: Not on file    Forced sexual activity: Not on file  Other Topics Concern  . Not on file  Social History Narrative  . Not on file   Patient lives in [place] Works as a Solicitor part-time Ex-smoker quit long time ago In 1961 . Smoked 2 packs per day for 8 years prior to that .  or a lot of stress since her daughter is also getting treated for stage IV uterine cancer.  FAMILY HISTORY:  Mother deceased Father with asthma and emphysema died at 40 years  with the MI. Brother deceased of heart disease   ALLERGIES:  is allergic to adhesive [tape]; nsaids; and statins.  MEDICATIONS:  Current Outpatient Medications  Medication Sig Dispense Refill  . acetaminophen (TYLENOL) 500 MG tablet Take 1,000 mg by mouth 2 (two) times daily.     Marland Kitchen amLODipine (NORVASC) 5 MG tablet Take 5 mg by mouth daily.  6  . atorvastatin (LIPITOR) 40 MG tablet Take 40 mg by mouth daily at 6 PM.     . carvedilol (COREG) 6.25 MG tablet Take 6.25 mg by mouth 2 (two) times daily.  6  . clobetasol ointment (TEMOVATE) 7.85 % Apply 1 application topically 2 (two) times daily.    . furosemide (LASIX) 20 MG tablet Take 20 mg by mouth daily.    . hydrocortisone (CORTEF) 10 MG tablet Take 15 mg by mouth daily.     Marland Kitchen levothyroxine (SYNTHROID, LEVOTHROID) 25 MCG tablet Take 50 mcg by mouth daily before breakfast.     . lidocaine-prilocaine (EMLA) cream Apply to affected area once 30 g 3  . magic mouthwash w/lidocaine SOLN Take 5 mLs by mouth 4 (four) times daily as needed for mouth pain. Swish and spit. (1 Part viscous lidocaine 2% 1 Part Maalox 1 Part diphenhydramine 12.5 mg per 35mL elixir) (Patient not taking: Reported on 12/26/2017) 120 mL 1  . ondansetron (ZOFRAN) 4 MG tablet Take 1 tablet (4 mg total) by mouth every 8 (eight) hours as needed for nausea. 30 tablet 2  . pantoprazole (PROTONIX) 20 MG tablet Take 20 mg by mouth daily.    . prochlorperazine (COMPAZINE) 10 MG tablet TAKE 1 TABLET BY MOUTH EVERY 6 HOURS AS NEEDED FOR NAUSEA OR VOMITING (Patient not taking: Reported on 01/15/2018) 30 tablet 2  . SUNItinib (SUTENT) 37.5 MG capsule 37.5 mg po daily 2 weeks on 1 week off (Patient not taking: Reported on 02/13/2018) 28 capsule 3  . traMADol (ULTRAM) 50 MG tablet TAKE 1/2 TO 1 (ONE-HALF TO ONE) TABLET BY MOUTH EVERY 6 HOURS AS NEEDED 60 tablet 0   No current facility-administered medications for this visit.     REVIEW OF SYSTEMS:    A 10+ POINT REVIEW OF SYSTEMS WAS  OBTAINED including neurology, dermatology, psychiatry, cardiac, respiratory, lymph, extremities, GI, GU, Musculoskeletal, constitutional, breasts, reproductive, HEENT.  All pertinent positives are noted in the HPI.  All others are negative.   PHYSICAL EXAMINATION:  ECOG PERFORMANCE STATUS: 1 - Symptomatic but completely ambulatory   Vitals:   02/13/18 0953  BP: (!) 160/68  Pulse: 61  Resp: 18  Temp: 97.6 F (36.4 C)  SpO2: 100%   Filed Weights   02/13/18 0953  Weight: 132 lb 12.8 oz (  60.2 kg)   .Body mass index is 26.82 kg/m.   GENERAL:alert, in no acute distress and comfortable SKIN: no acute rashes, no significant lesions EYES: conjunctiva are pink and non-injected, sclera anicteric OROPHARYNX: MMM, no exudates, no oropharyngeal erythema or ulceration NECK: supple, no JVD LYMPH:  no palpable lymphadenopathy in the cervical, axillary or inguinal regions LUNGS: clear to auscultation b/l with normal respiratory effort HEART: regular rate & rhythm ABDOMEN:  normoactive bowel sounds , non tender, not distended. No palpable hepatosplenomegaly.  Extremity: no pedal edema PSYCH: alert & oriented x 3 with fluent speech NEURO: no focal motor/sensory deficits   LABORATORY DATA:  I have reviewed the data as listed  . CBC Latest Ref Rng & Units 02/13/2018 01/30/2018 01/15/2018  WBC 4.0 - 10.5 K/uL 7.9 5.4 6.3  Hemoglobin 12.0 - 15.0 g/dL 10.8(L) 10.1(L) 10.4(L)  Hematocrit 36.0 - 46.0 % 35.0(L) 32.1(L) 31.9(L)  Platelets 150 - 400 K/uL 234 189 301    CBC    Component Value Date/Time   WBC 7.9 02/13/2018 0905   WBC 6.3 01/08/2018 0829   RBC 3.64 (L) 02/13/2018 0905   HGB 10.8 (L) 02/13/2018 0905   HGB 11.2 (L) 04/04/2017 0830   HCT 35.0 (L) 02/13/2018 0905   HCT 35.2 04/04/2017 0830   PLT 234 02/13/2018 0905   PLT 250 04/04/2017 0830   MCV 96.2 02/13/2018 0905   MCV 107.6 (H) 04/04/2017 0830   MCH 29.7 02/13/2018 0905   MCHC 30.9 02/13/2018 0905   RDW 14.4 02/13/2018  0905   RDW 14.0 04/04/2017 0830   LYMPHSABS 1.4 02/13/2018 0905   LYMPHSABS 1.6 04/04/2017 0830   MONOABS 0.9 02/13/2018 0905   MONOABS 0.4 04/04/2017 0830   EOSABS 0.3 02/13/2018 0905   EOSABS 0.1 04/04/2017 0830   BASOSABS 0.1 02/13/2018 0905   BASOSABS 0.0 04/04/2017 0830     . CMP Latest Ref Rng & Units 02/13/2018 01/30/2018 01/15/2018  Glucose 70 - 99 mg/dL 147(H) 148(H) 152(H)  BUN 8 - 23 mg/dL 30(H) 23 29(H)  Creatinine 0.44 - 1.00 mg/dL 1.71(H) 1.32(H) 1.47(H)  Sodium 135 - 145 mmol/L 140 141 139  Potassium 3.5 - 5.1 mmol/L 4.7 3.9 4.0  Chloride 98 - 111 mmol/L 103 108 105  CO2 22 - 32 mmol/L 25 23 24   Calcium 8.9 - 10.3 mg/dL 9.4 9.4 9.7  Total Protein 6.5 - 8.1 g/dL 6.7 6.5 7.5  Total Bilirubin 0.3 - 1.2 mg/dL 0.5 0.4 0.4  Alkaline Phos 38 - 126 U/L 60 52 67  AST 15 - 41 U/L 22 18 11(L)  ALT 0 - 44 U/L 31 21 10    B12 level 299 (OSH)-->455  . Lab Results  Component Value Date   LDH 133 02/13/2018         RADIOGRAPHIC STUDIES: I have personally reviewed the radiological images as listed and agreed with the findings in the report. Nm Pet Image Restag (ps) Skull Base To Thigh  Result Date: 01/03/2017 IMPRESSION: 1. There is a new lesion in the hepatic dome which is FDG avid and low in attenuation on CT imaging consistent with metastatic disease. Another region of uptake in the left hepatic lobe posteriorly demonstrates no CT correlate. A second subtle metastasis is not excluded on today's study. An MRI could better assess for other hepatic metastases if clinically warranted. 2. New metastatic lesion in the left side of T8. 3. New pulmonary nodule in the right upper lobe. This is too small to characterize but suspicious.  Recommend attention on follow-up. No FDG avid nodules or other enlarging nodules. 4. The uptake at the previous left renal artery has almost resolved in the interval and is favored to be post therapeutic. Recommend attention on follow-up. 5. The  metastasis in the posterior right hilum seen previously has resolved. Electronically Signed   By: Dorise Bullion III M.D   On: 01/03/2017 11:16   .No results found.  ASSESSMENT & PLAN:    77 y.o. 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 PET/CT 06/19/2017 -- consistent with partial metabolic response to treatment.  Rt adrenal gland bx - showed clear cell RCC  S/p CYtoreductive left radical nephrectomy and left adrenal gland resection on 08/01/2016 by Dr Alinda Money.  10/16/17 PET/CT revealed Continued improvement, with the T7 metastatic lesion no longer significantly hypermetabolic. The previous right lower lobe pulmonary nodule is even less apparent, perhaps about 2 mm in diameter today; given that this measured 6 mm on 03/28/2017 this probably represents an effectively treated metastatic lesion. Currently no appreciable hypermetabolic activity is identified to suggest active malignancy. Distended gallbladder with gallbladder wall thickening and gallstones. Correlate clinically in assessing for cholecystitis. Small but abnormal amount of free pelvic fluid, nonspecific. Other imaging findings of potential clinical significance: Chronic ethmoid sinusitis. Aortic Atherosclerosis. Stable 5 mm right middle lobe pulmonary nodule, not hypermetabolic but below sensitive PET-CT size thresholds. Left nephrectomy. Notable pelvic floor laxity with cystocele. Chronic bilateral Sacroiliitis.  #2 b/l adrenal metastases from [place] s/p left adrenalectomy with adrenal insuff - follows with Dr Buddy Duty.  #3 Small pulmonary lesions -- 10/16/17 PET/CT showed pulmonary less apparent than 03/28/17 PET/CT, decreasing from 34mm to 18mm diameter.   MRI brain shows no evidence of metastatic disease  #4 T7/8 Bone metastases - received Xgeva every 4 weeks from May 2018 to June 2019. 10/16/17 PET/CT revealed  improvement with T7 metastatic lesion no longer significantly hypermetabolic.  -Holding Xgeva as of 11/19/17 unless indicated in the future  #5 ?liver mets- rpt PET/CT from 10/16/2017 shows no overt evidence of metastatic disease in the liver.  #6 Grade 1 Nausea - improved and intermittent. Hasnt used her anti-emetic as instructed so some nausea and decreased po intake.  #7 Grade 1 Diarrhea - from sutent controlled with prn Imodium.  #8 Hyponatremia - nearly resolved with sodium at 138 - likely related to some element of adrenal insufficiency, diarrhea, limited by mouth intake. Primarily solute free fluid intake.   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 Buddy Duty -TSH normalized on 11/19/17 to 3.141  #14 HTN - ?control. Patient tends to be anxious and has higher blood pressures in the clinic. She can have increased blood pressure from Sutent as well. Plan: -continued close f/u with her PCP /  cardiology regarding the many elements necessary for her care that are not directly related to her oncology care. -ACEI held due to AKI and hyperkalemia - following with cardiology to mx this. Has been started on Amlodipine instead.  #15 Grade 1 mucositis- resolved -Advised the patient to continue use of  Magic Mouthwash to aid with nutrition  #16 Newly diagnosed ?CAD - positive cardiac nuclear stress test Following with cardiology and nephrology ? Need for pre-operative cardiac cath  #17 CHolecystitis - on hold due to cardiac issues noted on pre-operative evaluation.  PLAN:  -Recommended that the pt optimize her nutritional status and hydration status -Have held Sutent with considerations of CAD and impending cholecystectomy  -Discussed pt labwork today, 02/13/18; blood counts and chemistries are stable  -Recommend limiting hot showers, pat dry after shower, and moisturize significantly after shower. Recommend Sarna lotion for itching as well.  -The pt has no prohibitive toxicities from continuing Nivolumab every 2 weeks at this time. -Will refill Tramadol today -Will restart Xgeva every 4 weeks -Will see the pt back in 4 weeks     -continue Nivolumab q2weeks with labs- please schedule next 4 doses -RTC with Dr Irene Limbo in 4 weeks and 8 weeks -restart Marchelle Folks    All of the patients questions were answered with apparent satisfaction. The patient knows to call the clinic with any problems, questions or concerns.  The total time spent in the appt was 27 minutes and more than 50% was on counseling and direct patient cares.    Sullivan Lone MD MS AAHIVMS [facility] Hematology/Oncology Physician [facility]  (Office):       [phone] (Work cell):  [phone] (Fax):           [phone]  I, Baldwin [place], am acting as a scribe for [name]  .I have reviewed the above documentation for accuracy and completeness, and I agree with the above. Brunetta Genera MD

## 2018-02-13 NOTE — Progress Notes (Signed)
Per [name]: Ok to treat with Cr of 1.71

## 2018-02-13 NOTE — Telephone Encounter (Signed)
Scheduled appt per 10/25 los- pt to give updated schedule in treatment area.

## 2018-02-13 NOTE — Patient Instructions (Signed)
[facility] Discharge Instructions for Patients Receiving Chemotherapy  Today you received the following chemotherapy agents Opdivo.   To help prevent nausea and vomiting after your treatment, we encourage you to take your nausea medication as directed by your physician.   If you develop nausea and vomiting that is not controlled by your nausea medication, call the clinic.   BELOW ARE SYMPTOMS THAT SHOULD BE REPORTED IMMEDIATELY:  *FEVER GREATER THAN 100.5 F  *CHILLS WITH OR WITHOUT FEVER  NAUSEA AND VOMITING THAT IS NOT CONTROLLED WITH YOUR NAUSEA MEDICATION  *UNUSUAL SHORTNESS OF BREATH  *UNUSUAL BRUISING OR BLEEDING  TENDERNESS IN MOUTH AND THROAT WITH OR WITHOUT PRESENCE OF ULCERS  *URINARY PROBLEMS  *BOWEL PROBLEMS  UNUSUAL RASH Items with * indicate a potential emergency and should be followed up as soon as possible.  Feel free to call the clinic should you have any questions or concerns. The clinic phone number is [phone].  Please show the [place] at check-in to the Emergency Department and triage nurse.

## 2018-02-26 ENCOUNTER — Other Ambulatory Visit: Payer: Self-pay | Admitting: *Deleted

## 2018-02-26 DIAGNOSIS — C9000 Multiple myeloma not having achieved remission: Secondary | ICD-10-CM

## 2018-02-27 ENCOUNTER — Inpatient Hospital Stay: Payer: Medicare Other

## 2018-02-27 ENCOUNTER — Inpatient Hospital Stay: Payer: Medicare Other | Attending: Hematology

## 2018-02-27 ENCOUNTER — Telehealth: Payer: Self-pay | Admitting: *Deleted

## 2018-02-27 VITALS — BP 159/65 | HR 62 | Temp 97.8°F | Resp 20

## 2018-02-27 DIAGNOSIS — E785 Hyperlipidemia, unspecified: Secondary | ICD-10-CM | POA: Insufficient documentation

## 2018-02-27 DIAGNOSIS — M1990 Unspecified osteoarthritis, unspecified site: Secondary | ICD-10-CM | POA: Insufficient documentation

## 2018-02-27 DIAGNOSIS — C7951 Secondary malignant neoplasm of bone: Secondary | ICD-10-CM | POA: Diagnosis not present

## 2018-02-27 DIAGNOSIS — I10 Essential (primary) hypertension: Secondary | ICD-10-CM | POA: Insufficient documentation

## 2018-02-27 DIAGNOSIS — I700 Atherosclerosis of aorta: Secondary | ICD-10-CM | POA: Insufficient documentation

## 2018-02-27 DIAGNOSIS — Z79899 Other long term (current) drug therapy: Secondary | ICD-10-CM | POA: Insufficient documentation

## 2018-02-27 DIAGNOSIS — C9000 Multiple myeloma not having achieved remission: Secondary | ICD-10-CM

## 2018-02-27 DIAGNOSIS — I340 Nonrheumatic mitral (valve) insufficiency: Secondary | ICD-10-CM | POA: Diagnosis not present

## 2018-02-27 DIAGNOSIS — C7971 Secondary malignant neoplasm of right adrenal gland: Secondary | ICD-10-CM | POA: Diagnosis not present

## 2018-02-27 DIAGNOSIS — E538 Deficiency of other specified B group vitamins: Secondary | ICD-10-CM | POA: Diagnosis not present

## 2018-02-27 DIAGNOSIS — M461 Sacroiliitis, not elsewhere classified: Secondary | ICD-10-CM | POA: Insufficient documentation

## 2018-02-27 DIAGNOSIS — K649 Unspecified hemorrhoids: Secondary | ICD-10-CM | POA: Insufficient documentation

## 2018-02-27 DIAGNOSIS — I252 Old myocardial infarction: Secondary | ICD-10-CM | POA: Diagnosis not present

## 2018-02-27 DIAGNOSIS — Z905 Acquired absence of kidney: Secondary | ICD-10-CM | POA: Insufficient documentation

## 2018-02-27 DIAGNOSIS — Z87891 Personal history of nicotine dependence: Secondary | ICD-10-CM | POA: Insufficient documentation

## 2018-02-27 DIAGNOSIS — C642 Malignant neoplasm of left kidney, except renal pelvis: Secondary | ICD-10-CM | POA: Diagnosis not present

## 2018-02-27 DIAGNOSIS — C7972 Secondary malignant neoplasm of left adrenal gland: Secondary | ICD-10-CM | POA: Diagnosis not present

## 2018-02-27 DIAGNOSIS — C7800 Secondary malignant neoplasm of unspecified lung: Secondary | ICD-10-CM | POA: Insufficient documentation

## 2018-02-27 DIAGNOSIS — Z5112 Encounter for antineoplastic immunotherapy: Secondary | ICD-10-CM | POA: Diagnosis present

## 2018-02-27 DIAGNOSIS — E440 Moderate protein-calorie malnutrition: Secondary | ICD-10-CM | POA: Insufficient documentation

## 2018-02-27 DIAGNOSIS — I2510 Atherosclerotic heart disease of native coronary artery without angina pectoris: Secondary | ICD-10-CM | POA: Diagnosis not present

## 2018-02-27 DIAGNOSIS — K449 Diaphragmatic hernia without obstruction or gangrene: Secondary | ICD-10-CM | POA: Insufficient documentation

## 2018-02-27 DIAGNOSIS — Z7189 Other specified counseling: Secondary | ICD-10-CM

## 2018-02-27 DIAGNOSIS — M25519 Pain in unspecified shoulder: Secondary | ICD-10-CM | POA: Insufficient documentation

## 2018-02-27 DIAGNOSIS — C787 Secondary malignant neoplasm of liver and intrahepatic bile duct: Secondary | ICD-10-CM | POA: Insufficient documentation

## 2018-02-27 DIAGNOSIS — E871 Hypo-osmolality and hyponatremia: Secondary | ICD-10-CM | POA: Diagnosis not present

## 2018-02-27 DIAGNOSIS — R197 Diarrhea, unspecified: Secondary | ICD-10-CM | POA: Insufficient documentation

## 2018-02-27 DIAGNOSIS — E119 Type 2 diabetes mellitus without complications: Secondary | ICD-10-CM | POA: Diagnosis not present

## 2018-02-27 DIAGNOSIS — E2740 Unspecified adrenocortical insufficiency: Secondary | ICD-10-CM | POA: Diagnosis not present

## 2018-02-27 DIAGNOSIS — Z95828 Presence of other vascular implants and grafts: Secondary | ICD-10-CM

## 2018-02-27 DIAGNOSIS — N8110 Cystocele, unspecified: Secondary | ICD-10-CM | POA: Insufficient documentation

## 2018-02-27 DIAGNOSIS — R339 Retention of urine, unspecified: Secondary | ICD-10-CM | POA: Insufficient documentation

## 2018-02-27 LAB — CMP (CANCER CENTER ONLY)
ALT: 13 U/L (ref 0–44)
AST: 14 U/L — ABNORMAL LOW (ref 15–41)
Albumin: 3.9 g/dL (ref 3.5–5.0)
Alkaline Phosphatase: 49 U/L (ref 38–126)
Anion gap: 9 (ref 5–15)
BUN: 30 mg/dL — ABNORMAL HIGH (ref 8–23)
CO2: 25 mmol/L (ref 22–32)
Calcium: 10 mg/dL (ref 8.9–10.3)
Chloride: 102 mmol/L (ref 98–111)
Creatinine: 1.8 mg/dL — ABNORMAL HIGH (ref 0.44–1.00)
GFR, Est AFR Am: 31 mL/min — ABNORMAL LOW (ref >60)
GFR, Estimated: 26 mL/min — ABNORMAL LOW (ref >60)
Glucose, Bld: 147 mg/dL — ABNORMAL HIGH (ref 70–99)
Potassium: 5 mmol/L (ref 3.5–5.1)
Sodium: 136 mmol/L (ref 135–145)
Total Bilirubin: 0.6 mg/dL (ref 0.3–1.2)
Total Protein: 6.9 g/dL (ref 6.5–8.1)

## 2018-02-27 LAB — CBC WITH DIFFERENTIAL (CANCER CENTER ONLY)
Abs Immature Granulocytes: 0.06 10*3/uL (ref 0.00–0.07)
Basophils Absolute: 0 10*3/uL (ref 0.0–0.1)
Basophils Relative: 1 %
Eosinophils Absolute: 0.2 10*3/uL (ref 0.0–0.5)
Eosinophils Relative: 2 %
HCT: 35.6 % — ABNORMAL LOW (ref 36.0–46.0)
Hemoglobin: 11.2 g/dL — ABNORMAL LOW (ref 12.0–15.0)
Immature Granulocytes: 1 %
Lymphocytes Relative: 13 %
Lymphs Abs: 1.1 10*3/uL (ref 0.7–4.0)
MCH: 29.5 pg (ref 26.0–34.0)
MCHC: 31.5 g/dL (ref 30.0–36.0)
MCV: 93.7 fL (ref 80.0–100.0)
Monocytes Absolute: 0.8 10*3/uL (ref 0.1–1.0)
Monocytes Relative: 10 %
Neutro Abs: 6.1 10*3/uL (ref 1.7–7.7)
Neutrophils Relative %: 73 %
Platelet Count: 219 10*3/uL (ref 150–400)
RBC: 3.8 MIL/uL — ABNORMAL LOW (ref 3.87–5.11)
RDW: 14.4 % (ref 11.5–15.5)
WBC Count: 8.3 10*3/uL (ref 4.0–10.5)
nRBC: 0 % (ref 0.0–0.2)

## 2018-02-27 MED ORDER — DENOSUMAB 120 MG/1.7ML ~~LOC~~ SOLN
120.0000 mg | Freq: Once | SUBCUTANEOUS | Status: AC
  Administered 2018-02-27: 120 mg via SUBCUTANEOUS

## 2018-02-27 MED ORDER — SODIUM CHLORIDE 0.9 % IV SOLN
240.0000 mg | Freq: Once | INTRAVENOUS | Status: AC
  Administered 2018-02-27: 240 mg via INTRAVENOUS
  Filled 2018-02-27: qty 24

## 2018-02-27 MED ORDER — SODIUM CHLORIDE 0.9% FLUSH
10.0000 mL | INTRAVENOUS | Status: DC | PRN
  Administered 2018-02-27: 10 mL
  Filled 2018-02-27: qty 10

## 2018-02-27 MED ORDER — DENOSUMAB 120 MG/1.7ML ~~LOC~~ SOLN
SUBCUTANEOUS | Status: AC
  Filled 2018-02-27: qty 1.7

## 2018-02-27 MED ORDER — HEPARIN SOD (PORK) LOCK FLUSH 100 UNIT/ML IV SOLN
500.0000 [IU] | Freq: Once | INTRAVENOUS | Status: AC | PRN
  Administered 2018-02-27: 500 [IU]
  Filled 2018-02-27: qty 5

## 2018-02-27 MED ORDER — SODIUM CHLORIDE 0.9 % IV SOLN
Freq: Once | INTRAVENOUS | Status: AC
  Administered 2018-02-27: 11:00:00 via INTRAVENOUS
  Filled 2018-02-27: qty 250

## 2018-02-27 NOTE — Progress Notes (Signed)
Per Dr. Benay Spice,  [place] to treat with Crea  1.8 today.  Pharmacy to determine on Xgeva injection per protocol.

## 2018-02-27 NOTE — Patient Instructions (Signed)
[facility] Discharge Instructions for Patients Receiving Chemotherapy  Today you received the following chemotherapy agents: Nivolumab (Opdivo)  To help prevent nausea and vomiting after your treatment, we encourage you to take your nausea medication as directed.    If you develop nausea and vomiting that is not controlled by your nausea medication, call the clinic.   BELOW ARE SYMPTOMS THAT SHOULD BE REPORTED IMMEDIATELY:  *FEVER GREATER THAN 100.5 F  *CHILLS WITH OR WITHOUT FEVER  NAUSEA AND VOMITING THAT IS NOT CONTROLLED WITH YOUR NAUSEA MEDICATION  *UNUSUAL SHORTNESS OF BREATH  *UNUSUAL BRUISING OR BLEEDING  TENDERNESS IN MOUTH AND THROAT WITH OR WITHOUT PRESENCE OF ULCERS  *URINARY PROBLEMS  *BOWEL PROBLEMS  UNUSUAL RASH Items with * indicate a potential emergency and should be followed up as soon as possible.  Feel free to call the clinic should you have any questions or concerns. The clinic phone number is [phone].  Please show the CHEMO ALERT CARD at check-in to the Emergency Department and triage nurse.  Denosumab injection What is this medicine? DENOSUMAB (den oh sue mab) slows bone breakdown. Prolia is used to treat osteoporosis in women after menopause and in men. Xgeva is used to treat a high calcium level due to cancer and to prevent bone fractures and other bone problems caused by multiple myeloma or cancer bone metastases. Xgeva is also used to treat giant cell tumor of the bone. This medicine may be used for other purposes; ask your health care provider or pharmacist if you have questions. COMMON BRAND NAME(S): Prolia, XGEVA What should I tell my health care provider before I take this medicine? They need to know if you have any of these conditions: -dental disease -having surgery or tooth extraction -infection -kidney disease -low levels of calcium or Vitamin D in the blood -malnutrition -on hemodialysis -skin conditions or  sensitivity -thyroid or parathyroid disease -an unusual reaction to denosumab, other medicines, foods, dyes, or preservatives -pregnant or trying to get pregnant -breast-feeding How should I use this medicine? This medicine is for injection under the skin. It is given by a health care professional in a hospital or clinic setting. If you are getting Prolia, a special MedGuide will be given to you by the pharmacist with each prescription and refill. Be sure to read this information carefully each time. For Prolia, talk to your pediatrician regarding the use of this medicine in children. Special care may be needed. For Xgeva, talk to your pediatrician regarding the use of this medicine in children. While this drug may be prescribed for children as young as 13 years for selected conditions, precautions do apply. Overdosage: If you think you have taken too much of this medicine contact a poison control center or emergency room at once. NOTE: This medicine is only for you. Do not share this medicine with others. What if I miss a dose? It is important not to miss your dose. Call your doctor or health care professional if you are unable to keep an appointment. What may interact with this medicine? Do not take this medicine with any of the following medications: -other medicines containing denosumab This medicine may also interact with the following medications: -medicines that lower your chance of fighting infection -steroid medicines like prednisone or cortisone This list may not describe all possible interactions. Give your health care provider a list of all the medicines, herbs, non-prescription drugs, or dietary supplements you use. Also tell them if you smoke, drink alcohol, or   use illegal drugs. Some items may interact with your medicine. What should I watch for while using this medicine? Visit your doctor or health care professional for regular checks on your progress. Your doctor or health care  professional may order blood tests and other tests to see how you are doing. Call your doctor or health care professional for advice if you get a fever, chills or sore throat, or other symptoms of a cold or flu. Do not treat yourself. This drug may decrease your body's ability to fight infection. Try to avoid being around people who are sick. You should make sure you get enough calcium and vitamin D while you are taking this medicine, unless your doctor tells you not to. Discuss the foods you eat and the vitamins you take with your health care professional. See your dentist regularly. Brush and floss your teeth as directed. Before you have any dental work done, tell your dentist you are receiving this medicine. Do not become pregnant while taking this medicine or for 5 months after stopping it. Talk with your doctor or health care professional about your birth control options while taking this medicine. Women should inform their doctor if they wish to become pregnant or think they might be pregnant. There is a potential for serious side effects to an unborn child. Talk to your health care professional or pharmacist for more information. What side effects may I notice from receiving this medicine? Side effects that you should report to your doctor or health care professional as soon as possible: -allergic reactions like skin rash, itching or hives, swelling of the face, lips, or tongue -bone pain -breathing problems -dizziness -jaw pain, especially after dental work -redness, blistering, peeling of the skin -signs and symptoms of infection like fever or chills; cough; sore throat; pain or trouble passing urine -signs of low calcium like fast heartbeat, muscle cramps or muscle pain; pain, tingling, numbness in the hands or feet; seizures -unusual bleeding or bruising -unusually weak or tired Side effects that usually do not require medical attention (report to your doctor or health care professional  if they continue or are bothersome): -constipation -diarrhea -headache -joint pain -loss of appetite -muscle pain -runny nose -tiredness -upset stomach This list may not describe all possible side effects. Call your doctor for medical advice about side effects. You may report side effects to FDA at 1-800-FDA-1088. Where should I keep my medicine? This medicine is only given in a clinic, doctor's office, or other health care setting and will not be stored at home. NOTE: This sheet is a summary. It may not cover all possible information. If you have questions about this medicine, talk to your doctor, pharmacist, or health care provider.  2018 Elsevier/Gold Standard (2016-04-30 19:17:21)

## 2018-02-27 NOTE — Telephone Encounter (Signed)
Patient called and asked that copy of most recent labs be sent to her -  She forgot to ask for them while in infusion. Labs sent to patient.

## 2018-03-12 NOTE — Progress Notes (Signed)
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 The patient's last visit with [name] was on 02/13/18. She is accompanied today by her husband. The pt reports that she is doing well overall.   The pt reports stable hip and shoulder pain, which does not limit her. She denies nausea, vomiting, diarrhea, abdominal pains, and SOB. The pt notes that she has been eating very well and has gained some weight. The pt notes that she has been staying well hydrated. The pt is not taking Lasix at this time.   The pt notes that she is considering further evaluation of her uterine prolapse. She sees Audrey. Garwin [name] in [place]. She endorses an element of urinary retention.  The pt continues on her Levothyroxine every day. The pt notes that her blood sugars stay in 140s to 150s.   Lab results today (03/13/18) of CBC w/diff, CMP is as follows: all values are WNL except for RBC at 3.76, HGB at 10.9, HCT at 34.8, Abs immature granulocytes at 0.08k, Potassium at 5.4, Glucose at 146, BUN at 30, Creatinine at 1.91, AST at 12, GFR at 24. 03/13/18 LDH at 136  On review of systems, pt reports stable energy levels, stable hip and shoulder pain, eating very well, weight gain, staying hydrated, and denies nausea, vomiting, diarrhea, abdominal pains, SOB, leg swelling, and any other symptoms.   MEDICAL HISTORY:    Hypertension Dyslipidemia Osteoarthritis Ex-smoker Coronary artery disease Thyroid disorder-was apparently on levothyroxine 25 g daily which has  subsequently been discontinued . Diabetes Mitral regurgitation B12 deficiency  hiatal hernia with esophagitis  Myocardial infarction in 1991 no interventions   SURGICAL HISTORY: No reported past surgeries EGD 05/01/2016 Audrey [name] Colonoscopy 03/2016 Audrey [name]  SOCIAL HISTORY: Social History   Socioeconomic History  . Marital status: Married    Spouse name: Not on file  . Number of children: Not on file  . Years of education: Not on file  . Highest education level: Not on file  Occupational History  . Not on file  Social Needs  . Financial resource strain: Not on file  . Food insecurity:    Worry: Not on file    Inability: Not on file  . Transportation needs:    Medical: Not on file    Non-medical: Not on file  Tobacco Use  . Smoking status: Former Smoker    Years: 8.00    Last attempt to quit: 06/18/1964    Years since quitting: 53.7  . Smokeless tobacco: Never Used  Substance and Sexual Activity  . Alcohol use: No  . Drug use: No  . Sexual activity: Not on file  Lifestyle  . Physical activity:    Days per week: Not on file    Minutes per session: Not on file  . Stress: Not on file  Relationships  . Social connections:    Talks on phone: Not on file    Gets together: Not on file    Attends religious service: Not on file    Active  member of club or organization: Not on file    Attends meetings of clubs or organizations: Not on file    Relationship status: Not on file  . Intimate partner violence:    Fear of current or ex partner: Not on file    Emotionally abused: Not on file    Physically abused: Not on file    Forced sexual activity: Not on file  Other Topics Concern  . Not on file  Social History Narrative  . Not on file   Patient lives in [place] Works as a Solicitor part-time Ex-smoker quit long time ago In 1961 . Smoked 2 packs per day for 8 years prior to that .  or a lot of stress since her daughter is also getting treated for  stage IV uterine cancer.  FAMILY HISTORY:  Mother deceased Father with asthma and emphysema died at 80 years with the MI. Brother deceased of heart disease   ALLERGIES:  is allergic to adhesive [tape]; nsaids; and statins.  MEDICATIONS:  Current Outpatient Medications  Medication Sig Dispense Refill  . acetaminophen (TYLENOL) 500 MG tablet Take 1,000 mg by mouth 2 (two) times daily.     Audrey [name] amLODipine (NORVASC) 5 MG tablet Take 5 mg by mouth daily.  6  . atorvastatin (LIPITOR) 40 MG tablet Take 40 mg by mouth daily at 6 PM.     . carvedilol (COREG) 6.25 MG tablet Take 6.25 mg by mouth 2 (two) times daily.  6  . clobetasol ointment (TEMOVATE) 6.75 % Apply 1 application topically 2 (two) times daily.    . furosemide (LASIX) 20 MG tablet Take 20 mg by mouth daily.    . hydrocortisone (CORTEF) 10 MG tablet Take 15 mg by mouth daily.     Audrey [name] levothyroxine (SYNTHROID, LEVOTHROID) 25 MCG tablet Take 50 mcg by mouth daily before breakfast.     . lidocaine-prilocaine (EMLA) cream Apply to affected area once 30 g 3  . magic mouthwash w/lidocaine SOLN Take 5 mLs by mouth 4 (four) times daily as needed for mouth pain. Swish and spit. (1 Part viscous lidocaine 2% 1 Part Maalox 1 Part diphenhydramine 12.5 mg per 58mL elixir) (Patient not taking: Reported on 12/26/2017) 120 mL 1  . ondansetron (ZOFRAN) 4 MG tablet Take 1 tablet (4 mg total) by mouth every 8 (eight) hours as needed for nausea. 30 tablet 2  . pantoprazole (PROTONIX) 20 MG tablet Take 20 mg by mouth daily.    . prochlorperazine (COMPAZINE) 10 MG tablet TAKE 1 TABLET BY MOUTH EVERY 6 HOURS AS NEEDED FOR NAUSEA OR VOMITING (Patient not taking: Reported on 01/15/2018) 30 tablet 2  . SUNItinib (SUTENT) 37.5 MG capsule 37.5 mg po daily 2 weeks on 1 week off (Patient not taking: Reported on 02/13/2018) 28 capsule 3  . traMADol (ULTRAM) 50 MG tablet TAKE 1/2 TO 1 (ONE-HALF TO ONE) TABLET BY MOUTH EVERY 6 HOURS AS NEEDED 60 tablet 0   No current  facility-administered medications for this visit.     REVIEW OF SYSTEMS:    A 10+ POINT REVIEW OF SYSTEMS WAS OBTAINED including neurology, dermatology, psychiatry, cardiac, respiratory, lymph, extremities, GI, GU, Musculoskeletal, constitutional, breasts, reproductive, HEENT.  All pertinent positives are noted in the HPI.  All others are negative.   PHYSICAL EXAMINATION:  ECOG PERFORMANCE STATUS: 1 - Symptomatic but completely ambulatory   Vitals:   03/13/18 1015  BP: (!) 150/64  Pulse: 61  Resp: 18  Temp: 98 F (36.7  C)  SpO2: 99%   Filed Weights   03/13/18 1015  Weight: 135 lb 1.6 oz (61.3 kg)   .Body mass index is 27.29 kg/m.   GENERAL:alert, in no acute distress and comfortable SKIN: no acute rashes, no significant lesions EYES: conjunctiva are pink and non-injected, sclera anicteric OROPHARYNX: MMM, no exudates, no oropharyngeal erythema or ulceration NECK: supple, no JVD LYMPH:  no palpable lymphadenopathy in the cervical, axillary or inguinal regions LUNGS: clear to auscultation b/l with normal respiratory effort HEART: regular rate & rhythm ABDOMEN:  normoactive bowel sounds , non tender, not distended. No palpable hepatosplenomegaly.  Extremity: no pedal edema PSYCH: alert & oriented x 3 with fluent speech NEURO: no focal motor/sensory deficits   LABORATORY DATA:  I have reviewed the data as listed  . CBC Latest Ref Rng & Units 03/13/2018 02/27/2018 02/13/2018  WBC 4.0 - 10.5 K/uL 6.6 8.3 7.9  Hemoglobin 12.0 - 15.0 g/dL 10.9(L) 11.2(L) 10.8(L)  Hematocrit 36.0 - 46.0 % 34.8(L) 35.6(L) 35.0(L)  Platelets 150 - 400 K/uL 220 219 234    CBC    Component Value Date/Time   WBC 6.6 03/13/2018 0901   RBC 3.76 (L) 03/13/2018 0901   HGB 10.9 (L) 03/13/2018 0901   HGB 11.2 (L) 02/27/2018 0835   HGB 11.2 (L) 04/04/2017 0830   HCT 34.8 (L) 03/13/2018 0901   HCT 35.2 04/04/2017 0830   PLT 220 03/13/2018 0901   PLT 219 02/27/2018 0835   PLT 250 04/04/2017  0830   MCV 92.6 03/13/2018 0901   MCV 107.6 (H) 04/04/2017 0830   MCH 29.0 03/13/2018 0901   MCHC 31.3 03/13/2018 0901   RDW 13.9 03/13/2018 0901   RDW 14.0 04/04/2017 0830   LYMPHSABS 1.2 03/13/2018 0901   LYMPHSABS 1.6 04/04/2017 0830   MONOABS 0.8 03/13/2018 0901   MONOABS 0.4 04/04/2017 0830   EOSABS 0.1 03/13/2018 0901   EOSABS 0.1 04/04/2017 0830   BASOSABS 0.0 03/13/2018 0901   BASOSABS 0.0 04/04/2017 0830     . CMP Latest Ref Rng & Units 03/13/2018 02/27/2018 02/13/2018  Glucose 70 - 99 mg/dL 146(H) 147(H) 147(H)  BUN 8 - 23 mg/dL 30(H) 30(H) 30(H)  Creatinine 0.44 - 1.00 mg/dL 1.91(H) 1.80(H) 1.71(H)  Sodium 135 - 145 mmol/L 135 136 140  Potassium 3.5 - 5.1 mmol/L 5.4(H) 5.0 4.7  Chloride 98 - 111 mmol/L 100 102 103  CO2 22 - 32 mmol/L 26 25 25   Calcium 8.9 - 10.3 mg/dL 9.4 10.0 9.4  Total Protein 6.5 - 8.1 g/dL 6.9 6.9 6.7  Total Bilirubin 0.3 - 1.2 mg/dL 0.3 0.6 0.5  Alkaline Phos 38 - 126 U/L 54 49 60  AST 15 - 41 U/L 12(L) 14(L) 22  ALT 0 - 44 U/L 11 13 31    B12 level 299 (OSH)-->455  . Lab Results  Component Value Date   LDH 136 03/13/2018         RADIOGRAPHIC STUDIES: I have personally reviewed the radiological images as listed and agreed with the findings in the report. Nm Pet Image Restag (ps) Skull Base To Thigh  Result Date: 01/03/2017 IMPRESSION: 1. There is a new lesion in the hepatic dome which is FDG avid and low in attenuation on CT imaging consistent with metastatic disease. Another region of uptake in the left hepatic lobe posteriorly demonstrates no CT correlate. A second subtle metastasis is not excluded on today's study. An MRI could better assess for other hepatic metastases if clinically warranted. 2. New  metastatic lesion in the left side of T8. 3. New pulmonary nodule in the right upper lobe. This is too small to characterize but suspicious. Recommend attention on follow-up. No FDG avid nodules or other enlarging nodules. 4. The  uptake at the previous left renal artery has almost resolved in the interval and is favored to be post therapeutic. Recommend attention on follow-up. 5. The metastasis in the posterior right hilum seen previously has resolved. Electronically Signed   By: Dorise Bullion III M.D   On: 01/03/2017 11:16   .No results found.  ASSESSMENT & PLAN:    77 y.o. 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 PET/CT 06/19/2017 -- consistent with partial metabolic response to treatment.  Rt adrenal gland bx - showed clear cell RCC  S/p CYtoreductive left radical nephrectomy and left adrenal gland resection on 08/01/2016 by Audrey Alinda Money.  10/16/17 PET/CT revealed Continued improvement, with the T7 metastatic lesion no longer significantly hypermetabolic. The previous right lower lobe pulmonary nodule is even less apparent, perhaps about 2 mm in diameter today; given that this measured 6 mm on 03/28/2017 this probably represents an effectively treated metastatic lesion. Currently no appreciable hypermetabolic activity is identified to suggest active malignancy. Distended gallbladder with gallbladder wall thickening and gallstones. Correlate clinically in assessing for cholecystitis. Small but abnormal amount of free pelvic fluid, nonspecific. Other imaging findings of potential clinical significance: Chronic ethmoid sinusitis. Aortic Atherosclerosis. Stable 5 mm right middle lobe pulmonary nodule, not hypermetabolic but below sensitive PET-CT size thresholds. Left nephrectomy. Notable pelvic floor laxity with cystocele. Chronic bilateral Sacroiliitis.  #2 b/l adrenal metastases from [facility] s/p left adrenalectomy with adrenal insuff - follows with Audrey [name].  #3 Small pulmonary lesions -- 10/16/17 PET/CT showed pulmonary less apparent than 03/28/17 PET/CT, decreasing from 77mm to 97mm diameter.   MRI  brain shows no evidence of metastatic disease  #4 T7/8 Bone metastases - received Xgeva every 4 weeks from May 2018 to June 2019. 10/16/17 PET/CT revealed improvement with T7 metastatic lesion no longer significantly hypermetabolic.  -Holding Xgeva as of 11/19/17 unless indicated in the future  #5 ?liver mets- rpt PET/CT from 10/16/2017 shows no overt evidence of metastatic disease in the liver.  #6 Grade 1 Nausea - improved and intermittent. Hasnt used her anti-emetic as instructed so some nausea and decreased po intake.  #7 Grade 1 Diarrhea - from sutent controlled with prn Imodium.  #8 Hyponatremia - nearly resolved with sodium at 138 - likely related to some element of adrenal insufficiency, diarrhea, limited by mouth intake. Primarily solute free fluid intake.   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 Buddy [name] -TSH normalized on 11/19/17 to 3.141  #14 HTN - ?control. Patient tends to be anxious  and has higher blood pressures in the clinic. She can have increased blood pressure from Sutent as well. Plan: -continued close f/u with her PCP /cardiology regarding the many elements necessary for her care that are not directly related to her oncology care. -ACEI held due to AKI and hyperkalemia -  following with cardiology to mx this. Has been started on Amlodipine instead.  #15 Grade 1 mucositis- resolved -Advised the patient to continue use of Magic Mouthwash to aid with nutrition  #16 Newly diagnosed ?CAD - positive cardiac nuclear stress test Following with cardiology and nephrology ? Need for pre-operative cardiac cath  #17 CHolecystitis - on hold due to cardiac issues noted on pre-operative evaluation.  PLAN:  -Have held Sutent with considerations of CAD and impending cholecystectomy  -Discussed pt labwork today, 03/13/18; Potassium increased to 5.4, Creatinine increased to 1.91. LDH is normal -Follow up with OBGYN regarding uterine prolapse and concern for urinary retention, which could explain mild increase in Creatinine to 1.91 -Decrease consumption of potassium rich foods -Recommended that the pt improve her hydration status, drinking at least 48-64 oz of water each day -The pt has no prohibitive toxicities from continuing Nivolumab at this time.  -Continue Xgeva every 4 weeks -Will repeat PET/CT in 3 weeks -Will see the pt back in 4 weeks       -continue Nivolumab q2weeks with labs -- please schedule next 6 treatments -PET/CT in 3 weeks -RTC with Audrey Irene Limbo in 4 weeks   All of the patients questions were answered with apparent satisfaction. The patient knows to call the clinic with any problems, questions or concerns.  The total time spent in the appt was 30 minutes and more than 50% was on counseling and direct patient cares.    Sullivan Lone MD [place] AAHIVMS [facility] Hematology/Oncology Physician [facility]  (Office):       [phone] (Work cell):  [phone] (Fax):           [phone]  I, Baldwin [place], am acting as a scribe for Audrey. Sullivan Lone.   .I have reviewed the above documentation for accuracy and completeness, and I agree with the above. Brunetta Genera MD

## 2018-03-13 ENCOUNTER — Inpatient Hospital Stay: Payer: Medicare Other

## 2018-03-13 ENCOUNTER — Inpatient Hospital Stay (HOSPITAL_BASED_OUTPATIENT_CLINIC_OR_DEPARTMENT_OTHER): Payer: Medicare Other | Admitting: Hematology

## 2018-03-13 ENCOUNTER — Other Ambulatory Visit: Payer: Self-pay | Admitting: *Deleted

## 2018-03-13 VITALS — BP 150/64 | HR 61 | Temp 98.0°F | Resp 18 | Ht 59.0 in | Wt 135.1 lb

## 2018-03-13 DIAGNOSIS — Z905 Acquired absence of kidney: Secondary | ICD-10-CM

## 2018-03-13 DIAGNOSIS — M25519 Pain in unspecified shoulder: Secondary | ICD-10-CM

## 2018-03-13 DIAGNOSIS — R110 Nausea: Secondary | ICD-10-CM

## 2018-03-13 DIAGNOSIS — I252 Old myocardial infarction: Secondary | ICD-10-CM

## 2018-03-13 DIAGNOSIS — E119 Type 2 diabetes mellitus without complications: Secondary | ICD-10-CM

## 2018-03-13 DIAGNOSIS — C642 Malignant neoplasm of left kidney, except renal pelvis: Secondary | ICD-10-CM

## 2018-03-13 DIAGNOSIS — Z79899 Other long term (current) drug therapy: Secondary | ICD-10-CM

## 2018-03-13 DIAGNOSIS — K449 Diaphragmatic hernia without obstruction or gangrene: Secondary | ICD-10-CM

## 2018-03-13 DIAGNOSIS — C7971 Secondary malignant neoplasm of right adrenal gland: Secondary | ICD-10-CM

## 2018-03-13 DIAGNOSIS — Z5112 Encounter for antineoplastic immunotherapy: Secondary | ICD-10-CM | POA: Diagnosis not present

## 2018-03-13 DIAGNOSIS — R197 Diarrhea, unspecified: Secondary | ICD-10-CM

## 2018-03-13 DIAGNOSIS — Z7189 Other specified counseling: Secondary | ICD-10-CM

## 2018-03-13 DIAGNOSIS — C7972 Secondary malignant neoplasm of left adrenal gland: Secondary | ICD-10-CM

## 2018-03-13 DIAGNOSIS — I700 Atherosclerosis of aorta: Secondary | ICD-10-CM

## 2018-03-13 DIAGNOSIS — R339 Retention of urine, unspecified: Secondary | ICD-10-CM

## 2018-03-13 DIAGNOSIS — E2740 Unspecified adrenocortical insufficiency: Secondary | ICD-10-CM

## 2018-03-13 DIAGNOSIS — E039 Hypothyroidism, unspecified: Secondary | ICD-10-CM

## 2018-03-13 DIAGNOSIS — M461 Sacroiliitis, not elsewhere classified: Secondary | ICD-10-CM

## 2018-03-13 DIAGNOSIS — M1990 Unspecified osteoarthritis, unspecified site: Secondary | ICD-10-CM

## 2018-03-13 DIAGNOSIS — C7800 Secondary malignant neoplasm of unspecified lung: Secondary | ICD-10-CM | POA: Diagnosis not present

## 2018-03-13 DIAGNOSIS — Z87891 Personal history of nicotine dependence: Secondary | ICD-10-CM

## 2018-03-13 DIAGNOSIS — E871 Hypo-osmolality and hyponatremia: Secondary | ICD-10-CM

## 2018-03-13 DIAGNOSIS — C7951 Secondary malignant neoplasm of bone: Secondary | ICD-10-CM | POA: Diagnosis not present

## 2018-03-13 DIAGNOSIS — C787 Secondary malignant neoplasm of liver and intrahepatic bile duct: Secondary | ICD-10-CM

## 2018-03-13 DIAGNOSIS — E440 Moderate protein-calorie malnutrition: Secondary | ICD-10-CM

## 2018-03-13 DIAGNOSIS — E538 Deficiency of other specified B group vitamins: Secondary | ICD-10-CM

## 2018-03-13 DIAGNOSIS — I340 Nonrheumatic mitral (valve) insufficiency: Secondary | ICD-10-CM

## 2018-03-13 DIAGNOSIS — I2510 Atherosclerotic heart disease of native coronary artery without angina pectoris: Secondary | ICD-10-CM

## 2018-03-13 DIAGNOSIS — N8110 Cystocele, unspecified: Secondary | ICD-10-CM

## 2018-03-13 DIAGNOSIS — K649 Unspecified hemorrhoids: Secondary | ICD-10-CM

## 2018-03-13 DIAGNOSIS — E785 Hyperlipidemia, unspecified: Secondary | ICD-10-CM

## 2018-03-13 LAB — CBC WITH DIFFERENTIAL/PLATELET
Abs Immature Granulocytes: 0.08 10*3/uL — ABNORMAL HIGH (ref 0.00–0.07)
Basophils Absolute: 0 10*3/uL (ref 0.0–0.1)
Basophils Relative: 1 %
Eosinophils Absolute: 0.1 10*3/uL (ref 0.0–0.5)
Eosinophils Relative: 2 %
HCT: 34.8 % — ABNORMAL LOW (ref 36.0–46.0)
Hemoglobin: 10.9 g/dL — ABNORMAL LOW (ref 12.0–15.0)
Immature Granulocytes: 1 %
Lymphocytes Relative: 19 %
Lymphs Abs: 1.2 10*3/uL (ref 0.7–4.0)
MCH: 29 pg (ref 26.0–34.0)
MCHC: 31.3 g/dL (ref 30.0–36.0)
MCV: 92.6 fL (ref 80.0–100.0)
Monocytes Absolute: 0.8 10*3/uL (ref 0.1–1.0)
Monocytes Relative: 12 %
Neutro Abs: 4.3 10*3/uL (ref 1.7–7.7)
Neutrophils Relative %: 65 %
Platelets: 220 10*3/uL (ref 150–400)
RBC: 3.76 MIL/uL — ABNORMAL LOW (ref 3.87–5.11)
RDW: 13.9 % (ref 11.5–15.5)
WBC: 6.6 10*3/uL (ref 4.0–10.5)
nRBC: 0 % (ref 0.0–0.2)

## 2018-03-13 LAB — CMP (CANCER CENTER ONLY)
ALT: 11 U/L (ref 0–44)
AST: 12 U/L — ABNORMAL LOW (ref 15–41)
Albumin: 3.9 g/dL (ref 3.5–5.0)
Alkaline Phosphatase: 54 U/L (ref 38–126)
Anion gap: 9 (ref 5–15)
BUN: 30 mg/dL — ABNORMAL HIGH (ref 8–23)
CO2: 26 mmol/L (ref 22–32)
Calcium: 9.4 mg/dL (ref 8.9–10.3)
Chloride: 100 mmol/L (ref 98–111)
Creatinine: 1.91 mg/dL — ABNORMAL HIGH (ref 0.44–1.00)
GFR, Est AFR Am: 28 mL/min — ABNORMAL LOW (ref >60)
GFR, Estimated: 24 mL/min — ABNORMAL LOW (ref >60)
Glucose, Bld: 146 mg/dL — ABNORMAL HIGH (ref 70–99)
Potassium: 5.4 mmol/L — ABNORMAL HIGH (ref 3.5–5.1)
Sodium: 135 mmol/L (ref 135–145)
Total Bilirubin: 0.3 mg/dL (ref 0.3–1.2)
Total Protein: 6.9 g/dL (ref 6.5–8.1)

## 2018-03-13 LAB — LACTATE DEHYDROGENASE: LDH: 136 U/L (ref 98–192)

## 2018-03-13 MED ORDER — SODIUM CHLORIDE 0.9 % IV SOLN
240.0000 mg | Freq: Once | INTRAVENOUS | Status: AC
  Administered 2018-03-13: 240 mg via INTRAVENOUS
  Filled 2018-03-13: qty 24

## 2018-03-13 MED ORDER — HEPARIN SOD (PORK) LOCK FLUSH 100 UNIT/ML IV SOLN
500.0000 [IU] | Freq: Once | INTRAVENOUS | Status: AC | PRN
  Administered 2018-03-13: 500 [IU]
  Filled 2018-03-13: qty 5

## 2018-03-13 MED ORDER — SODIUM CHLORIDE 0.9 % IV SOLN
Freq: Once | INTRAVENOUS | Status: AC
  Administered 2018-03-13: 11:00:00 via INTRAVENOUS
  Filled 2018-03-13: qty 250

## 2018-03-13 MED ORDER — SODIUM CHLORIDE 0.9% FLUSH
10.0000 mL | INTRAVENOUS | Status: DC | PRN
  Administered 2018-03-13: 10 mL
  Filled 2018-03-13: qty 10

## 2018-03-13 NOTE — Patient Instructions (Signed)
[facility] Discharge Instructions for Patients Receiving Chemotherapy  Today you received the following chemotherapy agents Opdivo  To help prevent nausea and vomiting after your treatment, we encourage you to take your nausea medication as directed   If you develop nausea and vomiting that is not controlled by your nausea medication, call the clinic.   BELOW ARE SYMPTOMS THAT SHOULD BE REPORTED IMMEDIATELY:  *FEVER GREATER THAN 100.5 F  *CHILLS WITH OR WITHOUT FEVER  NAUSEA AND VOMITING THAT IS NOT CONTROLLED WITH YOUR NAUSEA MEDICATION  *UNUSUAL SHORTNESS OF BREATH  *UNUSUAL BRUISING OR BLEEDING  TENDERNESS IN MOUTH AND THROAT WITH OR WITHOUT PRESENCE OF ULCERS  *URINARY PROBLEMS  *BOWEL PROBLEMS  UNUSUAL RASH Items with * indicate a potential emergency and should be followed up as soon as possible.  Feel free to call the clinic should you have any questions or concerns. The clinic phone number is [phone].  Please show the CHEMO ALERT CARD at check-in to the Emergency Department and triage nurse.   

## 2018-03-18 ENCOUNTER — Other Ambulatory Visit: Payer: Self-pay | Admitting: Hematology

## 2018-03-27 ENCOUNTER — Inpatient Hospital Stay: Payer: Medicare Other

## 2018-03-27 ENCOUNTER — Inpatient Hospital Stay: Payer: Medicare Other | Attending: Hematology

## 2018-03-27 ENCOUNTER — Telehealth: Payer: Self-pay | Admitting: *Deleted

## 2018-03-27 VITALS — BP 174/66 | HR 59 | Temp 98.4°F | Resp 18

## 2018-03-27 DIAGNOSIS — Z5112 Encounter for antineoplastic immunotherapy: Secondary | ICD-10-CM | POA: Insufficient documentation

## 2018-03-27 DIAGNOSIS — Z95828 Presence of other vascular implants and grafts: Secondary | ICD-10-CM

## 2018-03-27 DIAGNOSIS — E039 Hypothyroidism, unspecified: Secondary | ICD-10-CM | POA: Insufficient documentation

## 2018-03-27 DIAGNOSIS — R110 Nausea: Secondary | ICD-10-CM | POA: Diagnosis not present

## 2018-03-27 DIAGNOSIS — C7800 Secondary malignant neoplasm of unspecified lung: Secondary | ICD-10-CM | POA: Diagnosis not present

## 2018-03-27 DIAGNOSIS — I10 Essential (primary) hypertension: Secondary | ICD-10-CM | POA: Insufficient documentation

## 2018-03-27 DIAGNOSIS — C642 Malignant neoplasm of left kidney, except renal pelvis: Secondary | ICD-10-CM | POA: Diagnosis not present

## 2018-03-27 DIAGNOSIS — Z79899 Other long term (current) drug therapy: Secondary | ICD-10-CM | POA: Insufficient documentation

## 2018-03-27 DIAGNOSIS — E871 Hypo-osmolality and hyponatremia: Secondary | ICD-10-CM | POA: Insufficient documentation

## 2018-03-27 DIAGNOSIS — Z87891 Personal history of nicotine dependence: Secondary | ICD-10-CM | POA: Insufficient documentation

## 2018-03-27 DIAGNOSIS — E119 Type 2 diabetes mellitus without complications: Secondary | ICD-10-CM | POA: Insufficient documentation

## 2018-03-27 DIAGNOSIS — K449 Diaphragmatic hernia without obstruction or gangrene: Secondary | ICD-10-CM | POA: Insufficient documentation

## 2018-03-27 DIAGNOSIS — I2510 Atherosclerotic heart disease of native coronary artery without angina pectoris: Secondary | ICD-10-CM | POA: Insufficient documentation

## 2018-03-27 DIAGNOSIS — E785 Hyperlipidemia, unspecified: Secondary | ICD-10-CM | POA: Insufficient documentation

## 2018-03-27 DIAGNOSIS — E538 Deficiency of other specified B group vitamins: Secondary | ICD-10-CM | POA: Insufficient documentation

## 2018-03-27 DIAGNOSIS — M1990 Unspecified osteoarthritis, unspecified site: Secondary | ICD-10-CM | POA: Insufficient documentation

## 2018-03-27 DIAGNOSIS — R197 Diarrhea, unspecified: Secondary | ICD-10-CM | POA: Insufficient documentation

## 2018-03-27 DIAGNOSIS — C7951 Secondary malignant neoplasm of bone: Secondary | ICD-10-CM | POA: Insufficient documentation

## 2018-03-27 DIAGNOSIS — Z7189 Other specified counseling: Secondary | ICD-10-CM

## 2018-03-27 LAB — CMP (CANCER CENTER ONLY)
ALT: 17 U/L (ref 0–44)
AST: 13 U/L — ABNORMAL LOW (ref 15–41)
Albumin: 3.9 g/dL (ref 3.5–5.0)
Alkaline Phosphatase: 52 U/L (ref 38–126)
Anion gap: 10 (ref 5–15)
BUN: 32 mg/dL — ABNORMAL HIGH (ref 8–23)
CO2: 23 mmol/L (ref 22–32)
Calcium: 9 mg/dL (ref 8.9–10.3)
Chloride: 103 mmol/L (ref 98–111)
Creatinine: 1.83 mg/dL — ABNORMAL HIGH (ref 0.44–1.00)
GFR, Est AFR Am: 31 mL/min — ABNORMAL LOW (ref >60)
GFR, Estimated: 26 mL/min — ABNORMAL LOW (ref >60)
Glucose, Bld: 153 mg/dL — ABNORMAL HIGH (ref 70–99)
Potassium: 5.2 mmol/L — ABNORMAL HIGH (ref 3.5–5.1)
Sodium: 136 mmol/L (ref 135–145)
Total Bilirubin: 0.4 mg/dL (ref 0.3–1.2)
Total Protein: 6.9 g/dL (ref 6.5–8.1)

## 2018-03-27 LAB — CBC WITH DIFFERENTIAL/PLATELET
Abs Immature Granulocytes: 0.05 10*3/uL (ref 0.00–0.07)
Basophils Absolute: 0 10*3/uL (ref 0.0–0.1)
Basophils Relative: 1 %
Eosinophils Absolute: 0.1 10*3/uL (ref 0.0–0.5)
Eosinophils Relative: 2 %
HCT: 34.8 % — ABNORMAL LOW (ref 36.0–46.0)
Hemoglobin: 11 g/dL — ABNORMAL LOW (ref 12.0–15.0)
Immature Granulocytes: 1 %
Lymphocytes Relative: 21 %
Lymphs Abs: 1.4 10*3/uL (ref 0.7–4.0)
MCH: 29 pg (ref 26.0–34.0)
MCHC: 31.6 g/dL (ref 30.0–36.0)
MCV: 91.8 fL (ref 80.0–100.0)
Monocytes Absolute: 0.6 10*3/uL (ref 0.1–1.0)
Monocytes Relative: 10 %
Neutro Abs: 4.2 10*3/uL (ref 1.7–7.7)
Neutrophils Relative %: 65 %
Platelets: 218 10*3/uL (ref 150–400)
RBC: 3.79 MIL/uL — ABNORMAL LOW (ref 3.87–5.11)
RDW: 14.4 % (ref 11.5–15.5)
WBC: 6.5 10*3/uL (ref 4.0–10.5)
nRBC: 0 % (ref 0.0–0.2)

## 2018-03-27 MED ORDER — HEPARIN SOD (PORK) LOCK FLUSH 100 UNIT/ML IV SOLN
500.0000 [IU] | Freq: Once | INTRAVENOUS | Status: AC | PRN
  Administered 2018-03-27: 500 [IU]
  Filled 2018-03-27: qty 5

## 2018-03-27 MED ORDER — SODIUM CHLORIDE 0.9 % IV SOLN
240.0000 mg | Freq: Once | INTRAVENOUS | Status: AC
  Administered 2018-03-27: 240 mg via INTRAVENOUS
  Filled 2018-03-27: qty 24

## 2018-03-27 MED ORDER — SODIUM CHLORIDE 0.9% FLUSH
10.0000 mL | INTRAVENOUS | Status: DC | PRN
  Administered 2018-03-27: 10 mL
  Filled 2018-03-27: qty 10

## 2018-03-27 MED ORDER — SODIUM CHLORIDE 0.9 % IV SOLN
Freq: Once | INTRAVENOUS | Status: AC
  Administered 2018-03-27: 11:00:00 via INTRAVENOUS
  Filled 2018-03-27: qty 250

## 2018-03-27 MED ORDER — SODIUM CHLORIDE 0.9% FLUSH
10.0000 mL | Freq: Once | INTRAVENOUS | Status: AC
  Administered 2018-03-27: 10 mL
  Filled 2018-03-27: qty 10

## 2018-03-27 NOTE — Progress Notes (Signed)
Per [name]: Ok to give Nivolumab infusion today with Cr 1.83 and K of 5.2. Please remind patient to follow up with Nephrologist regarding labs.

## 2018-03-27 NOTE — Telephone Encounter (Signed)
Patient gave verbal request and permission: Faxed copies of this AM lab work at [facility] to Dr. Joelyn Oms

## 2018-03-27 NOTE — Patient Instructions (Signed)
[facility] Discharge Instructions for Patients Receiving Chemotherapy  Today you received the following chemotherapy agents :  Nivolumab.  To help prevent nausea and vomiting after your treatment, we encourage you to take your nausea medication as prescribed.  PLEASE FOLLOW UP WITH YOUR KIDNEY DOCTOR ABOUT YOUR LAB RESULTS.     If you develop nausea and vomiting that is not controlled by your nausea medication, call the clinic.   BELOW ARE SYMPTOMS THAT SHOULD BE REPORTED IMMEDIATELY:  *FEVER GREATER THAN 100.5 F  *CHILLS WITH OR WITHOUT FEVER  NAUSEA AND VOMITING THAT IS NOT CONTROLLED WITH YOUR NAUSEA MEDICATION  *UNUSUAL SHORTNESS OF BREATH  *UNUSUAL BRUISING OR BLEEDING  TENDERNESS IN MOUTH AND THROAT WITH OR WITHOUT PRESENCE OF ULCERS  *URINARY PROBLEMS  *BOWEL PROBLEMS  UNUSUAL RASH Items with * indicate a potential emergency and should be followed up as soon as possible.  Feel free to call the clinic should you have any questions or concerns. The clinic phone number is [phone].  Please show the [place] at check-in to the Emergency Department and triage nurse.

## 2018-03-28 LAB — THYROID PANEL WITH TSH
Free Thyroxine Index: 1.7 (ref 1.2–4.9)
T3 Uptake Ratio: 25 % (ref 24–39)
T4, Total: 6.7 ug/dL (ref 4.5–12.0)
TSH: 12.02 u[IU]/mL — ABNORMAL HIGH (ref 0.450–4.500)

## 2018-04-03 ENCOUNTER — Ambulatory Visit (HOSPITAL_COMMUNITY)
Admission: RE | Admit: 2018-04-03 | Discharge: 2018-04-03 | Disposition: A | Discharge status: Home / Self Care | Payer: Medicare Other | Source: Ambulatory Visit | Attending: Hematology | Admitting: Hematology

## 2018-04-03 DIAGNOSIS — C642 Malignant neoplasm of left kidney, except renal pelvis: Secondary | ICD-10-CM | POA: Diagnosis present

## 2018-04-03 LAB — GLUCOSE, CAPILLARY: Glucose-Capillary: 116 mg/dL — ABNORMAL HIGH (ref 70–99)

## 2018-04-03 MED ORDER — FLUDEOXYGLUCOSE F - 18 (FDG) INJECTION
6.7000 | Freq: Once | INTRAVENOUS | Status: AC | PRN
  Administered 2018-04-03: 6.7 via INTRAVENOUS

## 2018-04-09 NOTE — Progress Notes (Signed)
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 The patient's last visit with [name] was on 03/13/18. She is accompanied today by her son and husband. The pt reports that she is doing well overall.   The pt reports that she has not developed any new concerns in the interim. The pt notes that she had a repeat ECHO this week and notes that her LV EF has nearly normalized, and she has continued follow up with cardiology. She notes that she has not had any fevers, chills, night sweats, vomiting, diarrhea, or nausea.   Of note since the patient's last visit, pt has had a PET/CT completed on 04/03/18 with results revealing No evidence for new or progressive hypermetabolic disease on today's study to suggest metastatic progression. 2. Stable appearance of the T7 metastatic lesion without Hypermetabolism. 3. Tiny focus of FDG accumulation identified along the skin of the low right inguinal fold. No associated lesion evident on CT. This may be related to urinary contaminant. 4. Cholelithiasis with similar appearance of diffuse gallbladder wall thickening. 5. Stable 5 mm right middle lobe pulmonary nodule. 6.  Aortic Atherosclerois. 7. Diffuse colonic diverticulosis.  Lab results today (04/10/18) of CBC w/diff and CMP is as follows: all values are WNL except for HGB at 11.4, HCT at 35.6, Abs immature granulocytes at 0.08k, Glucose at 171, BUN at 32, Creatinine at 1.81, AST at 13, GFR at 27.  On review of systems, pt reports stable energy levels,  eating well, and denies fevers, chills, night sweats, vomiting, diarrhea, nausea, leg swelling, and any other symptoms.    MEDICAL HISTORY:    Hypertension Dyslipidemia Osteoarthritis Ex-smoker Coronary artery disease Thyroid disorder-was apparently on levothyroxine 25 g daily which has subsequently been discontinued . 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 Earley Brooke Colonoscopy 03/2016 Dr. Earley Brooke  SOCIAL HISTORY: Social History   Socioeconomic History  . Marital status: Married    Spouse name: Not on file  . Number of children: Not on file  . Years of education: Not on file  . Highest education level: Not on file  Occupational History  . Not on file  Social Needs  . Financial resource strain: Not on file  . Food insecurity:    Worry: Not on file    Inability: Not on file  . Transportation needs:    Medical: Not on file    Non-medical: Not on file  Tobacco Use  . Smoking status: Former Smoker    Years: 8.00    Last attempt to quit: 06/18/1964    Years since quitting: 53.8  . Smokeless tobacco: Never Used  Substance and Sexual Activity  . Alcohol use: No  . Drug use: No  . Sexual activity: Not on file  Lifestyle  . Physical activity:    Days per week: Not on file    Minutes per session: Not on file  . Stress: Not  on file  Relationships  . Social connections:    Talks on phone: Not on file    Gets together: Not on file    Attends religious service: Not on file    Active member of club or organization: Not on file    Attends meetings of clubs or organizations: Not on file    Relationship status: Not on file  . Intimate partner violence:    Fear of current or ex partner: Not on file    Emotionally abused: Not on file    Physically abused: Not on file    Forced sexual activity: Not on file  Other Topics  Concern  . Not on file  Social History Narrative  . Not on file   Patient lives in [place] Works as a Solicitor part-time Ex-smoker quit long time ago In 1961 . Smoked 2 packs per day for 8 years prior to that .  or a lot of stress since her daughter is also getting treated for stage IV uterine cancer.  FAMILY HISTORY:  Mother deceased Father with asthma and emphysema died at 91 years with the MI. Brother deceased of heart disease   ALLERGIES:  is allergic to adhesive [tape]; nsaids; and statins.  MEDICATIONS:  Current Outpatient Medications  Medication Sig Dispense Refill  . acetaminophen (TYLENOL) 500 MG tablet Take 1,000 mg by mouth 2 (two) times daily.     Marland Kitchen amLODipine (NORVASC) 5 MG tablet Take 5 mg by mouth daily.  6  . atorvastatin (LIPITOR) 40 MG tablet Take 40 mg by mouth daily at 6 PM.     . carvedilol (COREG) 6.25 MG tablet Take 6.25 mg by mouth 2 (two) times daily.  6  . clobetasol ointment (TEMOVATE) 7.82 % Apply 1 application topically 2 (two) times daily.    . furosemide (LASIX) 20 MG tablet Take 20 mg by mouth daily.    . hydrocortisone (CORTEF) 10 MG tablet Take 15 mg by mouth daily.     Marland Kitchen levothyroxine (SYNTHROID, LEVOTHROID) 25 MCG tablet Take 50 mcg by mouth daily before breakfast.     . lidocaine-prilocaine (EMLA) cream Apply to affected area once 30 g 3  . magic mouthwash w/lidocaine SOLN Take 5 mLs by mouth 4 (four) times daily as needed for mouth pain. Swish and spit. (1 Part viscous lidocaine 2% 1 Part Maalox 1 Part diphenhydramine 12.5 mg per 78mL elixir) (Patient not taking: Reported on 12/26/2017) 120 mL 1  . ondansetron (ZOFRAN) 4 MG tablet Take 1 tablet (4 mg total) by mouth every 8 (eight) hours as needed for nausea. (Patient not taking: Reported on 03/13/2018) 30 tablet 2  . pantoprazole (PROTONIX) 20 MG tablet Take 20 mg by mouth daily.    . prochlorperazine (COMPAZINE) 10 MG tablet TAKE 1 TABLET BY MOUTH EVERY 6 HOURS AS NEEDED FOR  NAUSEA OR VOMITING (Patient not taking: Reported on 01/15/2018) 30 tablet 2  . SUNItinib (SUTENT) 37.5 MG capsule 37.5 mg po daily 2 weeks on 1 week off (Patient not taking: Reported on 02/13/2018) 28 capsule 3  . traMADol (ULTRAM) 50 MG tablet TAKE 1/2 TO 1 (ONE-HALF TO ONE) TABLET BY MOUTH EVERY 6 HOURS AS NEEDED 60 tablet 0   No current facility-administered medications for this visit.     REVIEW OF SYSTEMS:    A 10+ POINT REVIEW OF SYSTEMS WAS OBTAINED including neurology, dermatology, psychiatry, cardiac, respiratory, lymph, extremities, GI, GU, Musculoskeletal, constitutional, breasts, reproductive, HEENT.  All pertinent positives are noted  in the HPI.  All others are negative.   PHYSICAL EXAMINATION:  ECOG PERFORMANCE STATUS: 1 - Symptomatic but completely ambulatory   Vitals:   04/10/18 0921  BP: (!) 169/72  Pulse: 72  Resp: 18  Temp: 97.7 F (36.5 C)  SpO2: 99%   Filed Weights   04/10/18 0921  Weight: 135 lb 4.8 oz (61.4 kg)   .Body mass index is 27.33 kg/m.   GENERAL:alert, in no acute distress and comfortable SKIN: no acute rashes, no significant lesions EYES: conjunctiva are pink and non-injected, sclera anicteric OROPHARYNX: MMM, no exudates, no oropharyngeal erythema or ulceration NECK: supple, no JVD LYMPH:  no palpable lymphadenopathy in the cervical, axillary or inguinal regions LUNGS: clear to auscultation b/l with normal respiratory effort HEART: regular rate & rhythm ABDOMEN:  normoactive bowel sounds , non tender, not distended. No palpable hepatosplenomegaly.  Extremity: no pedal edema PSYCH: alert & oriented x 3 with fluent speech NEURO: no focal motor/sensory deficits   LABORATORY DATA:  I have reviewed the data as listed  . CBC Latest Ref Rng & Units 04/10/2018 03/27/2018 03/13/2018  WBC 4.0 - 10.5 K/uL 6.4 6.5 6.6  Hemoglobin 12.0 - 15.0 g/dL 11.4(L) 11.0(L) 10.9(L)  Hematocrit 36.0 - 46.0 % 35.6(L) 34.8(L) 34.8(L)  Platelets 150 - 400 K/uL  231 218 220    CBC    Component Value Date/Time   WBC 6.4 04/10/2018 0816   RBC 3.98 04/10/2018 0816   HGB 11.4 (L) 04/10/2018 0816   HGB 11.2 (L) 02/27/2018 0835   HGB 11.2 (L) 04/04/2017 0830   HCT 35.6 (L) 04/10/2018 0816   HCT 35.2 04/04/2017 0830   PLT 231 04/10/2018 0816   PLT 219 02/27/2018 0835   PLT 250 04/04/2017 0830   MCV 89.4 04/10/2018 0816   MCV 107.6 (H) 04/04/2017 0830   MCH 28.6 04/10/2018 0816   MCHC 32.0 04/10/2018 0816   RDW 14.2 04/10/2018 0816   RDW 14.0 04/04/2017 0830   LYMPHSABS 1.0 04/10/2018 0816   LYMPHSABS 1.6 04/04/2017 0830   MONOABS 0.9 04/10/2018 0816   MONOABS 0.4 04/04/2017 0830   EOSABS 0.1 04/10/2018 0816   EOSABS 0.1 04/04/2017 0830   BASOSABS 0.0 04/10/2018 0816   BASOSABS 0.0 04/04/2017 0830     . CMP Latest Ref Rng & Units 04/10/2018 03/27/2018 03/13/2018  Glucose 70 - 99 mg/dL 171(H) 153(H) 146(H)  BUN 8 - 23 mg/dL 32(H) 32(H) 30(H)  Creatinine 0.44 - 1.00 mg/dL 1.81(H) 1.83(H) 1.91(H)  Sodium 135 - 145 mmol/L 136 136 135  Potassium 3.5 - 5.1 mmol/L 4.7 5.2(H) 5.4(H)  Chloride 98 - 111 mmol/L 103 103 100  CO2 22 - 32 mmol/L 23 23 26   Calcium 8.9 - 10.3 mg/dL 9.2 9.0 9.4  Total Protein 6.5 - 8.1 g/dL 6.9 6.9 6.9  Total Bilirubin 0.3 - 1.2 mg/dL 0.4 0.4 0.3  Alkaline Phos 38 - 126 U/L 48 52 54  AST 15 - 41 U/L 13(L) 13(L) 12(L)  ALT 0 - 44 U/L 11 17 11    B12 level 299 (OSH)-->455  . Lab Results  Component Value Date   LDH 136 03/13/2018         RADIOGRAPHIC STUDIES: I have personally reviewed the radiological images as listed and agreed with the findings in the report. Nm Pet Image Restag (ps) Skull Base To Thigh  Result Date: 01/03/2017 IMPRESSION: 1. There is a new lesion in the hepatic dome which is FDG avid and low in attenuation on CT  imaging consistent with metastatic disease. Another region of uptake in the left hepatic lobe posteriorly demonstrates no CT correlate. A second subtle metastasis is not  excluded on today's study. An MRI could better assess for other hepatic metastases if clinically warranted. 2. New metastatic lesion in the left side of T8. 3. New pulmonary nodule in the right upper lobe. This is too small to characterize but suspicious. Recommend attention on follow-up. No FDG avid nodules or other enlarging nodules. 4. The uptake at the previous left renal artery has almost resolved in the interval and is favored to be post therapeutic. Recommend attention on follow-up. 5. The metastasis in the posterior right hilum seen previously has resolved. Electronically Signed   By: Dorise Bullion III M.D   On: 01/03/2017 11:16   .Nm Pet Image Restag (ps) Skull Base To Thigh  Result Date: 04/03/2018 CLINICAL DATA:  Subsequent treatment strategy for left renal cancer. EXAM: NUCLEAR MEDICINE PET SKULL BASE TO THIGH TECHNIQUE: 6.7 mCi F-18 FDG was injected intravenously. Full-ring PET imaging was performed from the skull base to thigh after the radiotracer. CT data was obtained and used for attenuation correction and anatomic localization. Fasting blood glucose: 116 mg/dl COMPARISON:  10/16/2017 FINDINGS: Mediastinal blood pool activity: SUV max 2.4 NECK: No hypermetabolic lymph nodes in the neck. Activity in the vocal cords likely related to motion after radiopharmaceutical injection. Incidental CT findings: none CHEST: No hypermetabolic mediastinal or hilar nodes. No suspicious pulmonary nodules on the CT scan. Incidental CT findings: Right Port-A-Cath tip is positioned in the distal SVC. Coronary artery calcification is evident. Atherosclerotic calcification is noted in the wall of the thoracic aorta. 5 mm right middle lobe pulmonary nodule (43/8) is stable without hypermetabolism evident. No new suspicious pulmonary nodule or mass. ABDOMEN/PELVIS: No abnormal hypermetabolic activity within the liver, pancreas, adrenal glands, or spleen. No hypermetabolic lymph nodes in the abdomen or pelvis. Tiny  focus of FDG accumulation identified along the skin of the low right inguinal crease. No underlying lesion evident and this may be related to urinary contaminant. Incidental CT findings: Gallstones again noted with similar appearance of mild gallbladder wall thickening. Left kidney surgically absent. There is abdominal aortic atherosclerosis without aneurysm. Advanced diverticular disease noted in the colon. Midline infraumbilical ventral hernia contains only fat. Pelvic floor laxity evident. SKELETON: Similar appearance of the T7 lesion on CT imaging. No associated hypermetabolism today. Incidental CT findings: none IMPRESSION: 1. No evidence for new or progressive hypermetabolic disease on today's study to suggest metastatic progression. 2. Stable appearance of the T7 metastatic lesion without hypermetabolism. 3. Tiny focus of FDG accumulation identified along the skin of the low right inguinal fold. No associated lesion evident on CT. This may be related to urinary contaminant. 4. Cholelithiasis with similar appearance of diffuse gallbladder wall thickening. 5. Stable 5 mm right middle lobe pulmonary nodule. 6.  Aortic Atherosclerois (ICD10-170.0) 7. Diffuse colonic diverticulosis. Electronically Signed   By: Misty Stanley M.D.   On: 04/03/2018 09:12    ASSESSMENT & PLAN:    77 y.o. 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 PET/CT 06/19/2017 -- consistent with partial metabolic response to treatment.  Rt adrenal gland bx - showed clear cell RCC  S/p CYtoreductive left radical nephrectomy and left adrenal gland resection on 08/01/2016 by Dr Alinda Money.  10/16/17 PET/CT revealed Continued improvement, with the T7 metastatic lesion no longer significantly  hypermetabolic. The previous right lower lobe pulmonary nodule is even less apparent, perhaps about 2 mm in diameter today;  given that this measured 6 mm on 03/28/2017 this probably represents an effectively treated metastatic lesion. Currently no appreciable hypermetabolic activity is identified to suggest active malignancy. Distended gallbladder with gallbladder wall thickening and gallstones. Correlate clinically in assessing for cholecystitis. Small but abnormal amount of free pelvic fluid, nonspecific. Other imaging findings of potential clinical significance: Chronic ethmoid sinusitis. Aortic Atherosclerosis. Stable 5 mm right middle lobe pulmonary nodule, not hypermetabolic but below sensitive PET-CT size thresholds. Left nephrectomy. Notable pelvic floor laxity with cystocele. Chronic bilateral Sacroiliitis.  #2 b/l adrenal metastases from [place] s/p left adrenalectomy with adrenal insuff - follows with Dr Buddy Duty.  #3 Small pulmonary lesions -- 10/16/17 PET/CT showed pulmonary less apparent than 03/28/17 PET/CT, decreasing from 51mm to 64mm diameter.   MRI brain shows no evidence of metastatic disease  #4 T7/8 Bone metastases - received Xgeva every 4 weeks from May 2018 to June 2019. 10/16/17 PET/CT revealed improvement with T7 metastatic lesion no longer significantly hypermetabolic.  -Holding Xgeva as of 11/19/17 unless indicated in the future  #5 ?liver mets- rpt PET/CT from 10/16/2017 shows no overt evidence of metastatic disease in the liver.  #6 Grade 1 Nausea - improved and intermittent. Hasnt used her anti-emetic as instructed so some nausea and decreased po intake.  #7 Grade 1 Diarrhea - from sutent controlled with prn Imodium.  #8 Hyponatremia - nearly resolved with sodium at 138 - likely related to some element of adrenal insufficiency, diarrhea, limited by mouth intake. Primarily solute free fluid intake.   #9 Acute on chronic renal insufficiency Creatine improved to 1.35  #10 Hyperkalemia due to ACEI + RF- resolved  #11 Hemorrhoids -chronic with some bleeing --Recommended Sitz bath and OTC Anusol or  Nupercaine for her hemorrhoid relief. 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 Buddy Duty -TSH normalized on 11/19/17 to 3.141  #14 HTN - ?control. Patient tends to be anxious and has higher blood pressures in the clinic. She can have increased blood pressure from Sutent as well. Plan: -continued close f/u with her PCP /cardiology regarding the many elements necessary for her care that are not directly related to her oncology care. -ACEI held due to AKI and hyperkalemia - following with cardiology to mx this. Has been started on Amlodipine instead.  #15 Grade 1 mucositis- resolved -Advised the patient to continue use of Magic Mouthwash to aid with nutrition  #16 Newly diagnosed ?CAD - positive cardiac nuclear stress test Following with cardiology and nephrology ? Need for pre-operative cardiac cath  #17 Cholecystitis - on hold due to cardiac issues noted on pre-operative evaluation.  PLAN:  -Discussed pt labwork today, 04/10/18; HGB continues to slowly improve, now to 11.4, Potassium has normalized to 4.7k, other blood counts and chemistries remain stable  -Discussed the 04/03/18 PET/CT which revealed No evidence for new or progressive hypermetabolic disease on today's study to suggest metastatic progression. 2. Stable appearance of the T7 metastatic lesion without Hypermetabolism. 3. Tiny  focus of FDG accumulation identified along the skin of the low right inguinal fold. No associated lesion evident on CT. This may be related  to urinary contaminant. 4. Cholelithiasis with similar appearance of diffuse gallbladder wall thickening. 5. Stable 5 mm right middle lobe pulmonary nodule. 6.  Aortic Atherosclerois. 7. Diffuse colonic diverticulosis.  -The pt has no prohibitive toxicities from continuing Nivolumab at this time. -Continue Xgeva every 4 weeks -Recommended that the pt continue to eat well, drink at least 48-64 oz of water each day, and walk 20-30 minutes each day.  -Have held Sutent with considerations of CAD and impending cholecystectomy  -Follow up with OBGYN regarding uterine prolapse and concern for urinary retention, which could explain mild increase in Creatinine -Will see the pt back in 4 weeks    -continue Nivolumab q2weeks with labs -- please schedule next 6 treatments -RTC with Dr Irene Limbo in 4 weeks   All of the patients questions were answered with apparent satisfaction. The patient knows to call the clinic with any problems, questions or concerns.  The total time spent in the appt was 25 minutes and more than 50% was on counseling and direct patient cares.   Sullivan Lone MD [place] AAHIVMS [facility] Hematology/Oncology Physician [facility]  (Office):       [phone] (Work cell):  [phone] (Fax):           [phone]  I, Baldwin [place], am acting as a scribe for Dr. Sullivan Lone.   .I have reviewed the above documentation for accuracy and completeness, and I agree with the above. Brunetta Genera MD

## 2018-04-10 ENCOUNTER — Inpatient Hospital Stay: Payer: Medicare Other

## 2018-04-10 ENCOUNTER — Telehealth: Payer: Self-pay | Admitting: Hematology

## 2018-04-10 ENCOUNTER — Inpatient Hospital Stay (HOSPITAL_BASED_OUTPATIENT_CLINIC_OR_DEPARTMENT_OTHER): Payer: Medicare Other | Admitting: Hematology

## 2018-04-10 VITALS — BP 169/72 | HR 72 | Temp 97.7°F | Resp 18 | Ht 59.0 in | Wt 135.3 lb

## 2018-04-10 DIAGNOSIS — Z79899 Other long term (current) drug therapy: Secondary | ICD-10-CM

## 2018-04-10 DIAGNOSIS — M1990 Unspecified osteoarthritis, unspecified site: Secondary | ICD-10-CM

## 2018-04-10 DIAGNOSIS — I2510 Atherosclerotic heart disease of native coronary artery without angina pectoris: Secondary | ICD-10-CM

## 2018-04-10 DIAGNOSIS — Z5112 Encounter for antineoplastic immunotherapy: Secondary | ICD-10-CM

## 2018-04-10 DIAGNOSIS — C7951 Secondary malignant neoplasm of bone: Secondary | ICD-10-CM

## 2018-04-10 DIAGNOSIS — E785 Hyperlipidemia, unspecified: Secondary | ICD-10-CM

## 2018-04-10 DIAGNOSIS — C642 Malignant neoplasm of left kidney, except renal pelvis: Secondary | ICD-10-CM

## 2018-04-10 DIAGNOSIS — Z87891 Personal history of nicotine dependence: Secondary | ICD-10-CM

## 2018-04-10 DIAGNOSIS — Z7189 Other specified counseling: Secondary | ICD-10-CM

## 2018-04-10 DIAGNOSIS — Z95828 Presence of other vascular implants and grafts: Secondary | ICD-10-CM

## 2018-04-10 DIAGNOSIS — C7800 Secondary malignant neoplasm of unspecified lung: Secondary | ICD-10-CM | POA: Diagnosis not present

## 2018-04-10 DIAGNOSIS — E119 Type 2 diabetes mellitus without complications: Secondary | ICD-10-CM

## 2018-04-10 DIAGNOSIS — R110 Nausea: Secondary | ICD-10-CM

## 2018-04-10 DIAGNOSIS — I10 Essential (primary) hypertension: Secondary | ICD-10-CM

## 2018-04-10 DIAGNOSIS — E538 Deficiency of other specified B group vitamins: Secondary | ICD-10-CM

## 2018-04-10 DIAGNOSIS — R197 Diarrhea, unspecified: Secondary | ICD-10-CM

## 2018-04-10 DIAGNOSIS — K449 Diaphragmatic hernia without obstruction or gangrene: Secondary | ICD-10-CM

## 2018-04-10 DIAGNOSIS — E871 Hypo-osmolality and hyponatremia: Secondary | ICD-10-CM

## 2018-04-10 DIAGNOSIS — E039 Hypothyroidism, unspecified: Secondary | ICD-10-CM

## 2018-04-10 LAB — CMP (CANCER CENTER ONLY)
ALT: 11 U/L (ref 0–44)
AST: 13 U/L — ABNORMAL LOW (ref 15–41)
Albumin: 4 g/dL (ref 3.5–5.0)
Alkaline Phosphatase: 48 U/L (ref 38–126)
Anion gap: 10 (ref 5–15)
BUN: 32 mg/dL — ABNORMAL HIGH (ref 8–23)
CO2: 23 mmol/L (ref 22–32)
Calcium: 9.2 mg/dL (ref 8.9–10.3)
Chloride: 103 mmol/L (ref 98–111)
Creatinine: 1.81 mg/dL — ABNORMAL HIGH (ref 0.44–1.00)
GFR, Est AFR Am: 31 mL/min — ABNORMAL LOW (ref >60)
GFR, Estimated: 27 mL/min — ABNORMAL LOW (ref >60)
Glucose, Bld: 171 mg/dL — ABNORMAL HIGH (ref 70–99)
Potassium: 4.7 mmol/L (ref 3.5–5.1)
Sodium: 136 mmol/L (ref 135–145)
Total Bilirubin: 0.4 mg/dL (ref 0.3–1.2)
Total Protein: 6.9 g/dL (ref 6.5–8.1)

## 2018-04-10 LAB — CBC WITH DIFFERENTIAL/PLATELET
Abs Immature Granulocytes: 0.08 10*3/uL — ABNORMAL HIGH (ref 0.00–0.07)
Basophils Absolute: 0 10*3/uL (ref 0.0–0.1)
Basophils Relative: 1 %
Eosinophils Absolute: 0.1 10*3/uL (ref 0.0–0.5)
Eosinophils Relative: 2 %
HCT: 35.6 % — ABNORMAL LOW (ref 36.0–46.0)
Hemoglobin: 11.4 g/dL — ABNORMAL LOW (ref 12.0–15.0)
Immature Granulocytes: 1 %
Lymphocytes Relative: 16 %
Lymphs Abs: 1 10*3/uL (ref 0.7–4.0)
MCH: 28.6 pg (ref 26.0–34.0)
MCHC: 32 g/dL (ref 30.0–36.0)
MCV: 89.4 fL (ref 80.0–100.0)
Monocytes Absolute: 0.9 10*3/uL (ref 0.1–1.0)
Monocytes Relative: 13 %
Neutro Abs: 4.3 10*3/uL (ref 1.7–7.7)
Neutrophils Relative %: 67 %
Platelets: 231 10*3/uL (ref 150–400)
RBC: 3.98 MIL/uL (ref 3.87–5.11)
RDW: 14.2 % (ref 11.5–15.5)
WBC: 6.4 10*3/uL (ref 4.0–10.5)
nRBC: 0 % (ref 0.0–0.2)

## 2018-04-10 MED ORDER — SODIUM CHLORIDE 0.9% FLUSH
10.0000 mL | INTRAVENOUS | Status: DC | PRN
  Administered 2018-04-10: 10 mL
  Filled 2018-04-10: qty 10

## 2018-04-10 MED ORDER — DENOSUMAB 120 MG/1.7ML ~~LOC~~ SOLN
SUBCUTANEOUS | Status: AC
  Filled 2018-04-10: qty 1.7

## 2018-04-10 MED ORDER — HEPARIN SOD (PORK) LOCK FLUSH 100 UNIT/ML IV SOLN
500.0000 [IU] | Freq: Once | INTRAVENOUS | Status: AC | PRN
  Administered 2018-04-10: 500 [IU]
  Filled 2018-04-10: qty 5

## 2018-04-10 MED ORDER — DENOSUMAB 120 MG/1.7ML ~~LOC~~ SOLN
120.0000 mg | Freq: Once | SUBCUTANEOUS | Status: AC
  Administered 2018-04-10: 120 mg via SUBCUTANEOUS

## 2018-04-10 MED ORDER — SODIUM CHLORIDE 0.9 % IV SOLN
Freq: Once | INTRAVENOUS | Status: AC
  Administered 2018-04-10: 11:00:00 via INTRAVENOUS
  Filled 2018-04-10: qty 250

## 2018-04-10 MED ORDER — SODIUM CHLORIDE 0.9% FLUSH
10.0000 mL | Freq: Once | INTRAVENOUS | Status: AC
  Administered 2018-04-10: 10 mL
  Filled 2018-04-10: qty 10

## 2018-04-10 MED ORDER — SODIUM CHLORIDE 0.9 % IV SOLN
240.0000 mg | Freq: Once | INTRAVENOUS | Status: AC
  Administered 2018-04-10: 240 mg via INTRAVENOUS
  Filled 2018-04-10: qty 24

## 2018-04-10 NOTE — Telephone Encounter (Signed)
Gave patient avs report and appointments for January  °

## 2018-04-10 NOTE — Progress Notes (Signed)
Per [name]: Audrey [name] to treat today with Cr of 1.81

## 2018-04-10 NOTE — Patient Instructions (Addendum)
[facility] Discharge Instructions for Patients Receiving Chemotherapy  Today you received the following chemotherapy agents :  Nivolumab.  To help prevent nausea and vomiting after your treatment, we encourage you to take your nausea medication as prescribed.  PLEASE FOLLOW UP WITH YOUR KIDNEY DOCTOR ABOUT YOUR LAB RESULTS.     If you develop nausea and vomiting that is not controlled by your nausea medication, call the clinic.   BELOW ARE SYMPTOMS THAT SHOULD BE REPORTED IMMEDIATELY:  *FEVER GREATER THAN 100.5 F  *CHILLS WITH OR WITHOUT FEVER  NAUSEA AND VOMITING THAT IS NOT CONTROLLED WITH YOUR NAUSEA MEDICATION  *UNUSUAL SHORTNESS OF BREATH  *UNUSUAL BRUISING OR BLEEDING  TENDERNESS IN MOUTH AND THROAT WITH OR WITHOUT PRESENCE OF ULCERS  *URINARY PROBLEMS  *BOWEL PROBLEMS  UNUSUAL RASH Items with * indicate a potential emergency and should be followed up as soon as possible.  Feel free to call the clinic should you have any questions or concerns. The clinic phone number is [phone].  Please show the [facility] at check-in to the Emergency Department and triage nurse.  Denosumab injection What is this medicine? DENOSUMAB (den oh sue mab) slows bone breakdown. Prolia is used to treat osteoporosis in women after menopause and in men, and in people who are taking corticosteroids for 6 months or more. Delton See is used to treat a high calcium level due to cancer and to prevent bone fractures and other bone problems caused by multiple myeloma or cancer bone metastases. Delton See is also used to treat giant cell tumor of the bone. This medicine may be used for other purposes; ask your health care provider or pharmacist if you have questions. COMMON BRAND NAME(S): Prolia, XGEVA What should I tell my health care provider before I take this medicine? They need to know if you have any of these conditions: -dental disease -having surgery or tooth  extraction -infection -kidney disease -low levels of calcium or Vitamin D in the blood -malnutrition -on hemodialysis -skin conditions or sensitivity -thyroid or parathyroid disease -an unusual reaction to denosumab, other medicines, foods, dyes, or preservatives -pregnant or trying to get pregnant -breast-feeding How should I use this medicine? This medicine is for injection under the skin. It is given by a health care professional in a hospital or clinic setting. A special MedGuide will be given to you before each treatment. Be sure to read this information carefully each time. For Prolia, talk to your pediatrician regarding the use of this medicine in children. Special care may be needed. For Delton See, talk to your pediatrician regarding the use of this medicine in children. While this drug may be prescribed for children as young as 13 years for selected conditions, precautions do apply. Overdosage: If you think you have taken too much of this medicine contact a poison control center or emergency room at once. NOTE: This medicine is only for you. Do not share this medicine with others. What if I miss a dose? It is important not to miss your dose. Call your doctor or health care professional if you are unable to keep an appointment. What may interact with this medicine? Do not take this medicine with any of the following medications: -other medicines containing denosumab This medicine may also interact with the following medications: -medicines that lower your chance of fighting infection -steroid medicines like prednisone or cortisone This list may not describe all possible interactions. Give your health care provider a list of all the medicines, herbs,  non-prescription drugs, or dietary supplements you use. Also tell them if you smoke, drink alcohol, or use illegal drugs. Some items may interact with your medicine. What should I watch for while using this medicine? Visit your doctor or  health care professional for regular checks on your progress. Your doctor or health care professional may order blood tests and other tests to see how you are doing. Call your doctor or health care professional for advice if you get a fever, chills or sore throat, or other symptoms of a cold or flu. Do not treat yourself. This drug may decrease your body's ability to fight infection. Try to avoid being around people who are sick. You should make sure you get enough calcium and vitamin D while you are taking this medicine, unless your doctor tells you not to. Discuss the foods you eat and the vitamins you take with your health care professional. See your dentist regularly. Brush and floss your teeth as directed. Before you have any dental work done, tell your dentist you are receiving this medicine. Do not become pregnant while taking this medicine or for 5 months after stopping it. Talk with your doctor or health care professional about your birth control options while taking this medicine. Women should inform their doctor if they wish to become pregnant or think they might be pregnant. There is a potential for serious side effects to an unborn child. Talk to your health care professional or pharmacist for more information. What side effects may I notice from receiving this medicine? Side effects that you should report to your doctor or health care professional as soon as possible: -allergic reactions like skin rash, itching or hives, swelling of the face, lips, or tongue -bone pain -breathing problems -dizziness -jaw pain, especially after dental work -redness, blistering, peeling of the skin -signs and symptoms of infection like fever or chills; cough; sore throat; pain or trouble passing urine -signs of low calcium like fast heartbeat, muscle cramps or muscle pain; pain, tingling, numbness in the hands or feet; seizures -unusual bleeding or bruising -unusually weak or tired Side effects that  usually do not require medical attention (report to your doctor or health care professional if they continue or are bothersome): -constipation -diarrhea -headache -joint pain -loss of appetite -muscle pain -runny nose -tiredness -upset stomach This list may not describe all possible side effects. Call your doctor for medical advice about side effects. You may report side effects to FDA at 1-800-FDA-1088. Where should I keep my medicine? This medicine is only given in a clinic, doctor's office, or other health care setting and will not be stored at home. NOTE: This sheet is a summary. It may not cover all possible information. If you have questions about this medicine, talk to your doctor, pharmacist, or health care provider.  2019 Elsevier/Gold Standard (2017-08-15 16:10:44)

## 2018-04-10 NOTE — Patient Instructions (Signed)
Thank you for choosing [facility] to provide your oncology and hematology care.  To afford each patient quality time with our providers, please arrive 30 minutes before your scheduled appointment time.  If you arrive late for your appointment, you may be asked to reschedule.  We strive to give you quality time with our providers, and arriving late affects you and other patients whose appointments are after yours.    If you are a no show for multiple scheduled visits, you may be dismissed from the clinic at the providers discretion.     Again, thank you for choosing [facility], our hope is that these requests will decrease the amount of time that you wait before being seen by our physicians.  ______________________________________________________________________   Should you have questions after your visit to the [facility], please contact our office at [phone] between the hours of 8:30 and 4:30 p.m.    Voicemails left after 4:30p.m will not be returned until the following business day.     For prescription refill requests, please have your pharmacy contact us directly.  Please also try to allow 48 hours for prescription requests.     Please contact the scheduling department for questions regarding scheduling.  For scheduling of procedures such as PET scans, CT scans, MRI, Ultrasound, etc please contact central scheduling at [phone].     Resources For Cancer Patients and Caregivers:    Oncolink.org:  A wonderful resource for patients and healthcare providers for information regarding your disease, ways to tract your treatment, what to expect, etc.      American Cancer Society:  800-227-2345  Can help patients locate various types of support and financial assistance   Cancer Care: 1-800-813-HOPE (4673) Provides financial assistance, online support groups, medication/co-pay assistance.     [place] DSS:  336-641-3447 Where to apply  for food stamps, Medicaid, and utility assistance   Medicare Rights Center: 800-333-4114 Helps people with Medicare understand their rights and benefits, navigate the Medicare system, and secure the quality healthcare they deserve   SCAT: 336-333-6589 [place] [employer]'s shared-ride transportation service for eligible riders who have a disability that prevents them from riding the fixed route bus.     For additional information on assistance programs please contact our social worker:   Abigail Elmore:  [phone]  

## 2018-04-24 ENCOUNTER — Inpatient Hospital Stay: Payer: Medicare Other

## 2018-04-24 ENCOUNTER — Inpatient Hospital Stay: Payer: Medicare Other | Attending: Hematology

## 2018-04-24 VITALS — BP 163/73 | HR 64 | Temp 98.1°F | Resp 17

## 2018-04-24 DIAGNOSIS — E119 Type 2 diabetes mellitus without complications: Secondary | ICD-10-CM | POA: Diagnosis not present

## 2018-04-24 DIAGNOSIS — Z7189 Other specified counseling: Secondary | ICD-10-CM

## 2018-04-24 DIAGNOSIS — I129 Hypertensive chronic kidney disease with stage 1 through stage 4 chronic kidney disease, or unspecified chronic kidney disease: Secondary | ICD-10-CM | POA: Insufficient documentation

## 2018-04-24 DIAGNOSIS — I700 Atherosclerosis of aorta: Secondary | ICD-10-CM | POA: Insufficient documentation

## 2018-04-24 DIAGNOSIS — C642 Malignant neoplasm of left kidney, except renal pelvis: Secondary | ICD-10-CM | POA: Diagnosis not present

## 2018-04-24 DIAGNOSIS — N189 Chronic kidney disease, unspecified: Secondary | ICD-10-CM | POA: Insufficient documentation

## 2018-04-24 DIAGNOSIS — E538 Deficiency of other specified B group vitamins: Secondary | ICD-10-CM | POA: Insufficient documentation

## 2018-04-24 DIAGNOSIS — E440 Moderate protein-calorie malnutrition: Secondary | ICD-10-CM | POA: Insufficient documentation

## 2018-04-24 DIAGNOSIS — C7951 Secondary malignant neoplasm of bone: Secondary | ICD-10-CM | POA: Diagnosis not present

## 2018-04-24 DIAGNOSIS — Z87891 Personal history of nicotine dependence: Secondary | ICD-10-CM | POA: Diagnosis not present

## 2018-04-24 DIAGNOSIS — K649 Unspecified hemorrhoids: Secondary | ICD-10-CM | POA: Diagnosis not present

## 2018-04-24 DIAGNOSIS — E1122 Type 2 diabetes mellitus with diabetic chronic kidney disease: Secondary | ICD-10-CM | POA: Insufficient documentation

## 2018-04-24 DIAGNOSIS — E875 Hyperkalemia: Secondary | ICD-10-CM | POA: Diagnosis not present

## 2018-04-24 DIAGNOSIS — I2510 Atherosclerotic heart disease of native coronary artery without angina pectoris: Secondary | ICD-10-CM | POA: Insufficient documentation

## 2018-04-24 DIAGNOSIS — M1990 Unspecified osteoarthritis, unspecified site: Secondary | ICD-10-CM | POA: Diagnosis not present

## 2018-04-24 DIAGNOSIS — E079 Disorder of thyroid, unspecified: Secondary | ICD-10-CM | POA: Diagnosis not present

## 2018-04-24 DIAGNOSIS — Z5112 Encounter for antineoplastic immunotherapy: Secondary | ICD-10-CM | POA: Diagnosis not present

## 2018-04-24 DIAGNOSIS — E785 Hyperlipidemia, unspecified: Secondary | ICD-10-CM | POA: Insufficient documentation

## 2018-04-24 DIAGNOSIS — Z79899 Other long term (current) drug therapy: Secondary | ICD-10-CM | POA: Insufficient documentation

## 2018-04-24 DIAGNOSIS — I10 Essential (primary) hypertension: Secondary | ICD-10-CM | POA: Diagnosis not present

## 2018-04-24 DIAGNOSIS — C787 Secondary malignant neoplasm of liver and intrahepatic bile duct: Secondary | ICD-10-CM | POA: Diagnosis not present

## 2018-04-24 DIAGNOSIS — K5730 Diverticulosis of large intestine without perforation or abscess without bleeding: Secondary | ICD-10-CM | POA: Diagnosis not present

## 2018-04-24 DIAGNOSIS — I252 Old myocardial infarction: Secondary | ICD-10-CM | POA: Insufficient documentation

## 2018-04-24 LAB — CBC WITH DIFFERENTIAL/PLATELET
Abs Immature Granulocytes: 0.1 10*3/uL — ABNORMAL HIGH (ref 0.00–0.07)
Basophils Absolute: 0 10*3/uL (ref 0.0–0.1)
Basophils Relative: 1 %
Eosinophils Absolute: 0.1 10*3/uL (ref 0.0–0.5)
Eosinophils Relative: 1 %
HCT: 36.2 % (ref 36.0–46.0)
Hemoglobin: 11.7 g/dL — ABNORMAL LOW (ref 12.0–15.0)
Immature Granulocytes: 1 %
Lymphocytes Relative: 17 %
Lymphs Abs: 1.2 10*3/uL (ref 0.7–4.0)
MCH: 29.7 pg (ref 26.0–34.0)
MCHC: 32.3 g/dL (ref 30.0–36.0)
MCV: 91.9 fL (ref 80.0–100.0)
Monocytes Absolute: 0.8 10*3/uL (ref 0.1–1.0)
Monocytes Relative: 12 %
Neutro Abs: 4.7 10*3/uL (ref 1.7–7.7)
Neutrophils Relative %: 68 %
Platelets: 213 10*3/uL (ref 150–400)
RBC: 3.94 MIL/uL (ref 3.87–5.11)
RDW: 14.3 % (ref 11.5–15.5)
WBC: 6.9 10*3/uL (ref 4.0–10.5)
nRBC: 0 % (ref 0.0–0.2)

## 2018-04-24 LAB — CMP (CANCER CENTER ONLY)
ALT: 16 U/L (ref 0–44)
AST: 15 U/L (ref 15–41)
Albumin: 3.9 g/dL (ref 3.5–5.0)
Alkaline Phosphatase: 63 U/L (ref 38–126)
Anion gap: 9 (ref 5–15)
BUN: 32 mg/dL — ABNORMAL HIGH (ref 8–23)
CO2: 22 mmol/L (ref 22–32)
Calcium: 9.8 mg/dL (ref 8.9–10.3)
Chloride: 105 mmol/L (ref 98–111)
Creatinine: 1.96 mg/dL — ABNORMAL HIGH (ref 0.44–1.00)
GFR, Est AFR Am: 28 mL/min — ABNORMAL LOW (ref >60)
GFR, Estimated: 24 mL/min — ABNORMAL LOW (ref >60)
Glucose, Bld: 194 mg/dL — ABNORMAL HIGH (ref 70–99)
Potassium: 4.9 mmol/L (ref 3.5–5.1)
Sodium: 136 mmol/L (ref 135–145)
Total Bilirubin: 0.4 mg/dL (ref 0.3–1.2)
Total Protein: 7 g/dL (ref 6.5–8.1)

## 2018-04-24 MED ORDER — SODIUM CHLORIDE 0.9% FLUSH
10.0000 mL | Freq: Once | INTRAVENOUS | Status: AC
  Administered 2018-04-24: 10 mL
  Filled 2018-04-24: qty 10

## 2018-04-24 MED ORDER — SODIUM CHLORIDE 0.9 % IV SOLN
240.0000 mg | Freq: Once | INTRAVENOUS | Status: AC
  Administered 2018-04-24: 240 mg via INTRAVENOUS
  Filled 2018-04-24: qty 24

## 2018-04-24 MED ORDER — SODIUM CHLORIDE 0.9 % IV SOLN
Freq: Once | INTRAVENOUS | Status: AC
  Administered 2018-04-24: 11:00:00 via INTRAVENOUS
  Filled 2018-04-24: qty 250

## 2018-04-24 MED ORDER — SODIUM CHLORIDE 0.9% FLUSH
10.0000 mL | INTRAVENOUS | Status: DC | PRN
  Administered 2018-04-24: 10 mL
  Filled 2018-04-24: qty 10

## 2018-04-24 MED ORDER — HEPARIN SOD (PORK) LOCK FLUSH 100 UNIT/ML IV SOLN
500.0000 [IU] | Freq: Once | INTRAVENOUS | Status: AC | PRN
  Administered 2018-04-24: 500 [IU]
  Filled 2018-04-24: qty 5

## 2018-04-24 NOTE — Patient Instructions (Signed)
[facility] Discharge Instructions for Patients Receiving Chemotherapy  Today you received the following chemotherapy agents Opdivo  To help prevent nausea and vomiting after your treatment, we encourage you to take your nausea medication as directed   If you develop nausea and vomiting that is not controlled by your nausea medication, call the clinic.   BELOW ARE SYMPTOMS THAT SHOULD BE REPORTED IMMEDIATELY:  *FEVER GREATER THAN 100.5 F  *CHILLS WITH OR WITHOUT FEVER  NAUSEA AND VOMITING THAT IS NOT CONTROLLED WITH YOUR NAUSEA MEDICATION  *UNUSUAL SHORTNESS OF BREATH  *UNUSUAL BRUISING OR BLEEDING  TENDERNESS IN MOUTH AND THROAT WITH OR WITHOUT PRESENCE OF ULCERS  *URINARY PROBLEMS  *BOWEL PROBLEMS  UNUSUAL RASH Items with * indicate a potential emergency and should be followed up as soon as possible.  Feel free to call the clinic should you have any questions or concerns. The clinic phone number is [phone].  Please show the CHEMO ALERT CARD at check-in to the Emergency Department and triage nurse.   

## 2018-04-24 NOTE — Progress Notes (Signed)
Okay to treat with today's labs per Dr. Kale. 

## 2018-04-29 ENCOUNTER — Other Ambulatory Visit: Payer: Self-pay | Admitting: Hematology

## 2018-05-01 ENCOUNTER — Other Ambulatory Visit: Payer: Self-pay | Admitting: Hematology

## 2018-05-01 MED ORDER — TRAMADOL HCL 50 MG PO TABS: 25.0000 mg | ORAL_TABLET | Freq: Four times a day (QID) | ORAL | 0 refills | Status: DC | PRN

## 2018-05-04 ENCOUNTER — Telehealth: Payer: Self-pay | Admitting: *Deleted

## 2018-05-04 NOTE — Telephone Encounter (Signed)
Patient was knocked down by son's 70 pound dog last week. Fell on left side. Has not been able to raise her left arm over her head since then due to extreme pain. Advised patient to seek care at Urgent Care or ED. Patient verbalized understanding.

## 2018-05-05 ENCOUNTER — Emergency Department (HOSPITAL_COMMUNITY)
Admission: EM | Admit: 2018-05-05 | Discharge: 2018-05-05 | Disposition: A | Discharge status: Home / Self Care | Payer: Medicare Other | Attending: Emergency Medicine | Admitting: Emergency Medicine

## 2018-05-05 ENCOUNTER — Emergency Department (HOSPITAL_COMMUNITY): Payer: Medicare Other

## 2018-05-05 ENCOUNTER — Encounter (HOSPITAL_COMMUNITY): Payer: Self-pay

## 2018-05-05 ENCOUNTER — Other Ambulatory Visit: Payer: Self-pay

## 2018-05-05 DIAGNOSIS — I10 Essential (primary) hypertension: Secondary | ICD-10-CM | POA: Insufficient documentation

## 2018-05-05 DIAGNOSIS — S139XXA Sprain of joints and ligaments of unspecified parts of neck, initial encounter: Secondary | ICD-10-CM | POA: Insufficient documentation

## 2018-05-05 DIAGNOSIS — E119 Type 2 diabetes mellitus without complications: Secondary | ICD-10-CM | POA: Diagnosis not present

## 2018-05-05 DIAGNOSIS — C7951 Secondary malignant neoplasm of bone: Secondary | ICD-10-CM | POA: Diagnosis not present

## 2018-05-05 DIAGNOSIS — W19XXXA Unspecified fall, initial encounter: Secondary | ICD-10-CM

## 2018-05-05 DIAGNOSIS — Y92009 Unspecified place in unspecified non-institutional (private) residence as the place of occurrence of the external cause: Secondary | ICD-10-CM | POA: Insufficient documentation

## 2018-05-05 DIAGNOSIS — Y999 Unspecified external cause status: Secondary | ICD-10-CM | POA: Diagnosis not present

## 2018-05-05 DIAGNOSIS — Z79899 Other long term (current) drug therapy: Secondary | ICD-10-CM | POA: Diagnosis not present

## 2018-05-05 DIAGNOSIS — Y9389 Activity, other specified: Secondary | ICD-10-CM | POA: Insufficient documentation

## 2018-05-05 DIAGNOSIS — S4992XA Unspecified injury of left shoulder and upper arm, initial encounter: Secondary | ICD-10-CM | POA: Diagnosis present

## 2018-05-05 DIAGNOSIS — Z87891 Personal history of nicotine dependence: Secondary | ICD-10-CM | POA: Insufficient documentation

## 2018-05-05 DIAGNOSIS — M25512 Pain in left shoulder: Secondary | ICD-10-CM | POA: Diagnosis not present

## 2018-05-05 DIAGNOSIS — W01198A Fall on same level from slipping, tripping and stumbling with subsequent striking against other object, initial encounter: Secondary | ICD-10-CM | POA: Insufficient documentation

## 2018-05-05 NOTE — ED Provider Notes (Signed)
[facility] EMERGENCY DEPARTMENT Provider Note   CSN: [id number] Arrival date & time: 05/05/18  4742     History   Chief Complaint Chief Complaint  Patient presents with  . Fall    HPI Audrey [name] is a 78 y.o. female.  HPI   Audrey [name] is a 78 y.o. female with a history of renal cell carcinoma with bony metastasis, diabetes, hypertension and hyperlipidemia.  Currently takes chemotherapy once a week.  She presents to the Emergency Department complaining of pain to her left upper arm, shoulder, neck and left chest wall.  She states that she was tripped by her dog 1 week ago and fell landing on her left side.  No head injury or LOC.  She got up unassisted.  She complains of continued pain and inability to abduct her left arm.  She states that she has chronic neck pain but her neck feels "stiff" since her fall.  She denies numbness or weakness of her extremities, headache, visual changes or dizziness, nausea or vomiting, or shortness of breath.  She is not currently on any anticoagulants.     Past Medical History:  Diagnosis Date  . Diabetes mellitus ([place])   . HTN (hypertension)   . Hyperlipidemia     Patient Active Problem List   Diagnosis Date Noted  . Port-A-Cath in place 01/30/2018  . Counseling regarding advance care planning and goals of care 01/04/2018  . Renal cell carcinoma ([place]) 04/11/2017  . 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 Left renal mass 08/01/2016    Past Surgical History:  Procedure Laterality Date  . IR IMAGING GUIDED PORT INSERTION  01/08/2018  . LAPAROSCOPIC NEPHRECTOMY Left 08/01/2016   Procedure: LAPAROSCOPIC  RADICAL NEPHRECTOMY/ REPAIR OF UMBILICAL HERNIA;  Surgeon: Raynelle Bring, MD;  Location: [facility] ORS;  Service: Urology;  Laterality: Left;     OB History   No obstetric history on file.      Home Medications    Prior to Admission medications   Medication Sig Start Date End Date Taking? Authorizing Provider  acetaminophen  (TYLENOL) 500 MG tablet Take 1,000 mg by mouth 2 (two) times daily.     [provider]  amLODipine (NORVASC) 5 MG tablet Take 5 mg by mouth daily. 09/01/17   [provider]  atorvastatin (LIPITOR) 40 MG tablet Take 40 mg by mouth daily at 6 PM.     [provider]  carvedilol (COREG) 6.25 MG tablet Take 6.25 mg by mouth 2 (two) times daily. 09/01/17   [provider]  clobetasol ointment (TEMOVATE) 5.95 % Apply 1 application topically 2 (two) times daily.    [provider]  furosemide (LASIX) 20 MG tablet Take 20 mg by mouth daily.    [provider]  hydrocortisone (CORTEF) 10 MG tablet Take 15 mg by mouth daily.     [provider]  levothyroxine (SYNTHROID, LEVOTHROID) 25 MCG tablet Take 50 mcg by mouth daily before breakfast.     [provider]  lidocaine-prilocaine (EMLA) cream Apply to affected area once 12/26/17   Brunetta Genera, MD  magic mouthwash w/lidocaine SOLN Take 5 mLs by mouth 4 (four) times daily as needed for mouth pain. Swish and spit. (1 Part viscous lidocaine 2% 1 Part Maalox 1 Part diphenhydramine 12.5 mg per 69mL elixir) Patient not taking: Reported on 12/26/2017 04/25/17   Brunetta Genera, MD  ondansetron (ZOFRAN) 4 MG tablet Take 1 tablet (4 mg total) by mouth  every 8 (eight) hours as needed for nausea. Patient not taking: Reported on 03/13/2018 06/03/17   Brunetta Genera, MD  pantoprazole (PROTONIX) 20 MG tablet Take 20 mg by mouth daily.    [provider]  prochlorperazine (COMPAZINE) 10 MG tablet TAKE 1 TABLET BY MOUTH EVERY 6 HOURS AS NEEDED FOR NAUSEA OR VOMITING Patient not taking: Reported on 01/15/2018 06/03/17   Brunetta Genera, MD  SUNItinib (SUTENT) 37.5 MG capsule 37.5 mg po daily 2 weeks on 1 week off Patient not taking: Reported on 02/13/2018 09/10/17   Brunetta Genera, MD  traMADol (ULTRAM) 50 MG tablet Take 0.5-1 tablets (25-50 mg total) by mouth every 6 (six)  hours as needed. 05/01/18   Brunetta Genera, MD    Family History No family history on file.  Social History Social History   Tobacco Use  . Smoking status: Former Smoker    Years: 8.00    Last attempt to quit: 06/18/1964    Years since quitting: 53.9  . Smokeless tobacco: Never Used  Substance Use Topics  . Alcohol use: No  . Drug use: No     Allergies   Adhesive [tape]; Nsaids; and Statins   Review of Systems Review of Systems  Constitutional: Negative for chills and fever.  Eyes: Negative for visual disturbance.  Respiratory: Negative for shortness of breath.   Cardiovascular: Negative for chest pain.  Gastrointestinal: Negative for abdominal pain, diarrhea, nausea and vomiting.  Genitourinary: Negative for difficulty urinating and dysuria.  Musculoskeletal: Positive for arthralgias (Left upper arm pain, neck pain) and neck pain. Negative for back pain, joint swelling and neck stiffness.  Skin: Negative for color change and wound.  Neurological: Negative for dizziness, weakness, numbness and headaches.  Psychiatric/Behavioral: Negative for confusion.     Physical Exam Updated Vital Signs BP (!) 165/87 (BP Location: Right Arm)   Pulse 67   Temp 98.2 F (36.8 C) (Oral)   Resp 12   Ht 4\' 11"  (1.499 m)   Wt 61.2 kg   SpO2 100%   BMI 27.27 kg/m   Physical Exam Vitals signs and nursing note reviewed.  Constitutional:      General: She is not in acute distress.    Appearance: She is not ill-appearing or toxic-appearing.  HENT:     Head: Atraumatic.  Neck:     Musculoskeletal: Normal range of motion. Muscular tenderness present.  Cardiovascular:     Rate and Rhythm: Normal rate and regular rhythm.     Pulses: Normal pulses.  Pulmonary:     Effort: Pulmonary effort is normal.     Breath sounds: Normal breath sounds. No wheezing.     Comments: Mild, diffusely tender lateral left chest wall.  No crepitus or bony deformities. Chest:     Chest wall:  Tenderness present.  Abdominal:     General: There is no distension.     Palpations: Abdomen is soft.     Tenderness: There is no abdominal tenderness. There is no guarding.  Musculoskeletal:        General: Tenderness and signs of injury present. No swelling or deformity.     Comments: Diffuse tenderness along the cervical spine and bilateral cervical paraspinal muscles.  No bony step-offs.  There is tenderness to palpation of the mid left humerus.  No bony deformity noted or edema.  Mild tenderness also noted at the left [facility] joint.  Patient is unable to abduct left arm greater than 20 degrees due to level  of pain.  Left elbow and wrist are nontender.  Grip strengths are strong and equal bilaterally. Mild tenderness through ROM of the right shoulder.    Skin:    General: Skin is warm.     Capillary Refill: Capillary refill takes less than 2 seconds.     Findings: No rash.  Neurological:     General: No focal deficit present.     Mental Status: She is alert and oriented to person, place, and time.     Sensory: No sensory deficit.     Motor: No weakness.  Psychiatric:        Mood and Affect: Mood normal.      ED Treatments / Results  Labs (all labs ordered are listed, but only abnormal results are displayed) Labs Reviewed - No data to display  EKG None  Radiology Dg Chest 2 View  Result Date: 05/05/2018 CLINICAL DATA:  Pain following fall. History of renal cell carcinoma EXAM: CHEST - 2 VIEW COMPARISON:  PET-CT April 03, 2018 FINDINGS: Port-A-Cath tip is in the superior vena cava. No pneumothorax. There is mild atelectatic change in the left lower lobe. Lungs elsewhere are clear. Heart is upper normal in size with pulmonary vascularity normal. There is aortic atherosclerosis. No adenopathy. There is degenerative change in the thoracic spine. Apparent metastatic focus noted in the T7 vertebral body is not well seen by radiography. No acute fracture evident. There is left carotid  artery calcification. IMPRESSION: No edema or consolidation. Degenerative changes noted in the thoracic spine. Metastatic focus in the T7 vertebral body is not well appreciated by radiography. There is aortic atherosclerosis. No pneumothorax. There is left carotid artery calcification. Aortic Atherosclerosis (ICD10-I70.0). Electronically Signed   By: Lowella Grip III M.D.   On: 05/05/2018 10:31   Dg Cervical Spine Complete  Result Date: 05/05/2018 CLINICAL DATA:  Pain following fall EXAM: CERVICAL SPINE - COMPLETE 4+ VIEW COMPARISON:  None. FINDINGS: Frontal, lateral, open-mouth odontoid, and bilateral oblique views were obtained. There is no demonstrable fracture. There is minimal anterolisthesis of C3 on C4 and minimal anterolisthesis of C4 on C5. No other spondylolisthesis evident. Prevertebral soft tissues and predental space regions are normal. There is severe disc space narrowing at C5-6 and C6-7. There are prominent anterior osteophytes at C5, C6, and C7. There is facet hypertrophy with exit foraminal narrowing due to bony hypertrophy at C3-4, C4-5, C5-6, and C6-7 bilaterally, overall somewhat more severe on the right than on the left. There are no erosive changes. No blastic or lytic bone lesions. IMPRESSION: Multifocal osteoarthritic change. Slight spondylolisthesis at C3-4 and C4-5 is felt to be due to underlying spondylosis. No blastic or lytic bone lesions. No fracture. Electronically Signed   By: Lowella Grip III M.D.   On: 05/05/2018 10:36   Dg Shoulder Right  Result Date: 05/05/2018 CLINICAL DATA:  Pain following fall EXAM: RIGHT SHOULDER - 2+ VIEW COMPARISON:  None. FINDINGS: Oblique, Y scapular, and axillary images were obtained. No fracture or dislocation. Mild generalized osteoarthritic change noted. No erosive change. IMPRESSION: Mild generalized osteoarthritic change.  No fracture or dislocation. Electronically Signed   By: Lowella Grip III M.D.   On: 05/05/2018 10:33    Dg Shoulder Left  Result Date: 05/05/2018 CLINICAL DATA:  Pain following fall EXAM: LEFT SHOULDER - 2+ VIEW COMPARISON:  None. FINDINGS: Oblique, Y scapular, and axillary images were obtained. There is no fracture or dislocation. There is mild generalized osteoarthritic change. No erosive change or intra-articular calcification.  There is aortic atherosclerosis. IMPRESSION: No acute fracture or dislocation. Mild generalized osteoarthritic change. No erosive change. Aortic atherosclerosis noted. Aortic Atherosclerosis (ICD10-I70.0). Electronically Signed   By: Lowella Grip III M.D.   On: 05/05/2018 10:32   Dg Humerus Left  Result Date: 05/05/2018 CLINICAL DATA:  Left upper arm pain after a fall. EXAM: LEFT HUMERUS - 2+ VIEW COMPARISON:  Shoulder radiographs of same date FINDINGS: Visualized portion of the left hemithorax is normal. No acute fracture or dislocation. Degenerate changes of the acromioclavicular joint. IMPRESSION: No acute osseous abnormality. Electronically Signed   By: Abigail Miyamoto M.D.   On: 05/05/2018 12:24   Dg Hip Unilat With Pelvis 2-3 Views Right  Result Date: 05/05/2018 CLINICAL DATA:  Pain following fall EXAM: DG HIP (WITH OR WITHOUT PELVIS) 2-3V RIGHT COMPARISON:  None. FINDINGS: Frontal pelvis as well as frontal and lateral right hip images were obtained. No acute fracture or dislocation. There is mild symmetric narrowing of each hip joint. No erosive changes. No blastic or lytic bone lesions. There is osteoarthritic change in the sacroiliac joints. There is iliac and femoral artery atherosclerotic calcification bilaterally. IMPRESSION: No fracture or dislocation. 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 No erosive changes. Slight symmetric narrowing of each hip joint. Multiple foci of arterial vascular calcifications/atherosclerosis noted. Electronically Signed   By: Lowella Grip III M.D.   On: 05/05/2018 10:34    Procedures Procedures (including critical  care time)  Medications Ordered in ED Medications - No data to display   Initial Impression / Assessment and Plan / ED Course  I have reviewed the triage vital signs and the nursing notes.  Pertinent labs & imaging results that were available during my care of the patient were reviewed by me and considered in my medical decision making (see chart for details).     Pt with mechanical fall onto her left arm one week ago.  XR's are neg for fx or dislocation.  She is NV intact.  VSS.  Ambulatory with a steady gait.  No focal neurological deficits. Injuries are felt to be musculoskeletal and may be related to a rotator cuff injury.  I have discussed importance of close follow-up with her PCP or orthopedics.  She has tramadol at home, takes one daily for pain.  I have advised her to take two daily along with tylenol q 4hrs as needed until f/u appt.  She requested referral list for local PCP which I have provided.  Pt agrees to plan and verbalized understanding.    Pt also seen by Dr. Regenia Skeeter and care plan discussed.    Final Clinical Impressions(s) / ED Diagnoses   Final diagnoses:  Fall, initial encounter  Acute pain of left shoulder  Neck sprain, initial encounter    ED Discharge Orders    None       Kem Parkinson, Hershal Coria 05/05/18 1945    Sherwood Gambler, MD 05/06/18 0730

## 2018-05-05 NOTE — Discharge Instructions (Addendum)
You may try applying warm heat to your neck in ice packs on and off to your left shoulder and arm.  Take your tramadol twice a day until you see your oncologist later this week to help control your pain.  I have provided referral information for a orthopedist to arrange follow-up.  Return to the ER for any worsening symptoms.   [facility] Primary Care Doctor List    Sinda Du MD. Specialty: Pulmonary Disease Contact information: [place]  [place] [address]  [phone]   Tula Nakayama, MD. Specialty: [facility] Medicine Contact information: [address] [place] [address]  [phone]   Sallee Lange, MD. Specialty: [facility] Medicine Contact information: [place]  [place] [address]  [phone]   Rosita Fire, MD Specialty: Internal Medicine Contact information: [place] [address]  [phone]   Delphina Cahill, MD. Specialty: Internal Medicine Contact information: [place] [address]  [phone]    [facility] (Dr. Maudie Mercury) Specialty: Family Medicine Contact information: [place] [address]  [phone]   Leslie Andrea, MD. Specialty: [facility] Medicine Contact information: [place] [address]  [phone]   Asencion Noble, MD. Specialty: Internal Medicine Contact information: [address]  [place] [address]  [place]  [address] [place],  [address] [phone]  Services [facility] offers a variety of basic health services.  Services include but are not limited to: Blood pressure checks  Heart rate checks  Blood sugar checks  Urine analysis  Rapid strep tests  Pregnancy tests.  Health education and referrals  People needing more complex services will be directed to a  physician online. Using these virtual visits, doctors can evaluate and prescribe medicine and treatments. There will be no medication on-site, though [facility] will help patients fill their prescriptions at little to no cost.   For More information please go to: [email or web address]

## 2018-05-05 NOTE — ED Triage Notes (Signed)
Pt got tripped up by a dog last week and is complaining right arm, left arm, left knee, and neck stiffness from fall. Also has slight pain on her left rib. Is in NAD, but sore.

## 2018-05-05 NOTE — ED Notes (Signed)
Pt to xray at this time.

## 2018-05-07 NOTE — Progress Notes (Signed)
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 The patient's last visit with [name] was on 04/10/18. She is accompanied today by her husband. The pt reports that she is doing well overall.   The pt reports that she has been fairly itchy but denies skin rashes. She recently fell on her left side, being knocked down by her son's large puppy, and presented to the ED a few days ago for further evaluation and notes that no bones were broken.   The pt notes that she has not been able to be very active and has been feeling more physically weak recently.   The pt notes that she has been eating well and has enjoyed stable energy levels. She denies abdominal pains. 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   The pt is continuing follow up with her OBGYN and is continuing to consider surgery for her pro-lapse.  Lab results today (05/08/18) of CBC w/diff and CMP is as follows: all values are WNL except for RBC at 3.69, HGB at 10.9, HCT at 33.8, Abs immature granulocytes at 0.08k, Potassium at 5.5, Glucose at 177, BUN at 31, Creatinine at 1.99, AST at 12, GFR at 24.  On review of systems, pt reports left sided aches, stable energy levels, itching, eating well, breathing well, and denies skin rashes, fevers, chills, night sweats, abdominal pains, leg swelling, and any other symptoms.  MEDICAL HISTORY:    Hypertension Dyslipidemia Osteoarthritis Ex-smoker Coronary artery disease Thyroid disorder-was apparently on levothyroxine 25 g  daily which has subsequently been discontinued . 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 Earley Brooke Colonoscopy 03/2016 Dr. Earley Brooke  SOCIAL HISTORY: Social History   Socioeconomic History  . Marital status: Married    Spouse name: Not on file  . Number of children: Not on file  . Years of education: Not on file  . Highest education level: Not on file  Occupational History  . Not on file  Social Needs  . Financial resource strain: Not on file  . Food insecurity:    Worry: Not on file    Inability: Not on file  . Transportation needs:    Medical: Not on file    Non-medical: Not on file  Tobacco Use  . Smoking status: Former Smoker    Years: 8.00    Last attempt to quit: 06/18/1964    Years since quitting: 53.9  . Smokeless tobacco: Never Used  Substance and Sexual Activity  . Alcohol use: No  . Drug use: No  . Sexual activity: Not on file  Lifestyle  . Physical activity:    Days per week: Not on file    Minutes per session: Not on file  . Stress: Not on file  Relationships  . Social connections:    Talks on phone: Not on file    Gets together: Not on file    Attends religious  service: Not on file    Active member of club or organization: Not on file    Attends meetings of clubs or organizations: Not on file    Relationship status: Not on file  . Intimate partner violence:    Fear of current or ex partner: Not on file    Emotionally abused: Not on file    Physically abused: Not on file    Forced sexual activity: Not on file  Other Topics Concern  . Not on file  Social History Narrative  . Not on file   Patient lives in [place] Works as a Solicitor part-time Ex-smoker quit long time ago In 1961 . Smoked 2 packs per day for 8 years prior to that .  or a lot of stress since her daughter is also  getting treated for stage IV uterine cancer.  FAMILY HISTORY:  Mother deceased Father with asthma and emphysema died at 8 years with the MI. Brother deceased of heart disease   ALLERGIES:  is allergic to adhesive [tape]; nsaids; and statins.  MEDICATIONS:  Current Outpatient Medications  Medication Sig Dispense Refill  . acetaminophen (TYLENOL) 500 MG tablet Take 1,000 mg by mouth 2 (two) times daily.     Marland Kitchen amLODipine (NORVASC) 5 MG tablet Take 5 mg by mouth daily.  6  . atorvastatin (LIPITOR) 40 MG tablet Take 40 mg by mouth daily at 6 PM.     . Calcium 600-200 MG-UNIT tablet Take 1 tablet by mouth daily.    . carvedilol (COREG) 6.25 MG tablet Take 6.25 mg by mouth 2 (two) times daily.  6  . clobetasol ointment (TEMOVATE) 0.73 % Apply 1 application topically 2 (two) times daily.    Marland Kitchen denosumab (XGEVA) 120 MG/1.7ML SOLN injection Inject 120 mg into the skin every 6 (six) weeks.    . ferrous sulfate 324 (65 Fe) MG TBEC Take 1 tablet by mouth daily.    . hydrocortisone (CORTEF) 10 MG tablet Take 10 mg by mouth 2 (two) times daily.     Marland Kitchen levothyroxine (SYNTHROID, LEVOTHROID) 50 MCG tablet Take 50 mcg by mouth daily before breakfast.     . lidocaine-prilocaine (EMLA) cream Apply to affected area once 30 g 3  . Nivolumab (OPDIVO IV) Inject 1 Dose into the vein every 14 (fourteen) days.    . pantoprazole (PROTONIX) 20 MG tablet Take 20 mg by mouth daily.    . traMADol (ULTRAM) 50 MG tablet Take 0.5-1 tablets (25-50 mg total) by mouth every 6 (six) hours as needed. 60 tablet 0   No current facility-administered medications for this visit.    Facility-Administered Medications Ordered in Other Visits  Medication Dose Route Frequency Provider Last Rate Last Dose  . denosumab (XGEVA) injection 120 mg  120 mg Subcutaneous Once Brunetta Genera, MD      . heparin lock flush 100 unit/mL  500 Units Intracatheter Once PRN Brunetta Genera, MD      . nivolumab (OPDIVO) 240 mg in sodium  chloride 0.9 % 100 mL chemo infusion  240 mg Intravenous Once Brunetta Genera, MD      . sodium chloride flush (NS) 0.9 % injection 10 mL  10 mL Intracatheter PRN Brunetta Genera, MD        REVIEW OF SYSTEMS:    A 10+ POINT REVIEW OF SYSTEMS WAS OBTAINED including neurology, dermatology, psychiatry, cardiac, respiratory, lymph, extremities, GI, GU, Musculoskeletal, constitutional, breasts, reproductive, HEENT.  All pertinent positives are noted  in the HPI.  All others are negative.   PHYSICAL EXAMINATION:  ECOG PERFORMANCE STATUS: 1 - Symptomatic but completely ambulatory   Vitals:   05/08/18 1227  BP: 134/61  Pulse: (!) 58  Resp: 18  Temp: 98.4 F (36.9 C)  SpO2: 100%   Filed Weights   05/08/18 1227  Weight: 135 lb 12.8 oz (61.6 kg)   .Body mass index is 27.43 kg/m.   GENERAL:alert, in no acute distress and comfortable SKIN: no acute rashes, no significant lesions EYES: conjunctiva are pink and non-injected, sclera anicteric OROPHARYNX: MMM, no exudates, no oropharyngeal erythema or ulceration NECK: supple, no JVD LYMPH:  no palpable lymphadenopathy in the cervical, axillary or inguinal regions LUNGS: clear to auscultation b/l with normal respiratory effort HEART: regular rate & rhythm ABDOMEN:  normoactive bowel sounds , non tender, not distended. No palpable hepatosplenomegaly.  Extremity: no pedal edema PSYCH: alert & oriented x 3 with fluent speech NEURO: no focal motor/sensory deficits   LABORATORY DATA:  I have reviewed the data as listed  . CBC Latest Ref Rng & Units 05/08/2018 04/24/2018 04/10/2018  WBC 4.0 - 10.5 K/uL 7.0 6.9 6.4  Hemoglobin 12.0 - 15.0 g/dL 10.9(L) 11.7(L) 11.4(L)  Hematocrit 36.0 - 46.0 % 33.8(L) 36.2 35.6(L)  Platelets 150 - 400 K/uL 215 213 231    CBC    Component Value Date/Time   WBC 7.0 05/08/2018 1142   RBC 3.69 (L) 05/08/2018 1142   HGB 10.9 (L) 05/08/2018 1142   HGB 11.2 (L) 02/27/2018 0835   HGB 11.2 (L)  04/04/2017 0830   HCT 33.8 (L) 05/08/2018 1142   HCT 35.2 04/04/2017 0830   PLT 215 05/08/2018 1142   PLT 219 02/27/2018 0835   PLT 250 04/04/2017 0830   MCV 91.6 05/08/2018 1142   MCV 107.6 (H) 04/04/2017 0830   MCH 29.5 05/08/2018 1142   MCHC 32.2 05/08/2018 1142   RDW 14.2 05/08/2018 1142   RDW 14.0 04/04/2017 0830   LYMPHSABS 1.2 05/08/2018 1142   LYMPHSABS 1.6 04/04/2017 0830   MONOABS 0.9 05/08/2018 1142   MONOABS 0.4 04/04/2017 0830   EOSABS 0.2 05/08/2018 1142   EOSABS 0.1 04/04/2017 0830   BASOSABS 0.1 05/08/2018 1142   BASOSABS 0.0 04/04/2017 0830     . CMP Latest Ref Rng & Units 05/08/2018 04/24/2018 04/10/2018  Glucose 70 - 99 mg/dL 177(H) 194(H) 171(H)  BUN 8 - 23 mg/dL 31(H) 32(H) 32(H)  Creatinine 0.44 - 1.00 mg/dL 1.99(H) 1.96(H) 1.81(H)  Sodium 135 - 145 mmol/L 135 136 136  Potassium 3.5 - 5.1 mmol/L 5.5(H) 4.9 4.7  Chloride 98 - 111 mmol/L 102 105 103  CO2 22 - 32 mmol/L 25 22 23   Calcium 8.9 - 10.3 mg/dL 9.8 9.8 9.2  Total Protein 6.5 - 8.1 g/dL 6.6 7.0 6.9  Total Bilirubin 0.3 - 1.2 mg/dL 0.5 0.4 0.4  Alkaline Phos 38 - 126 U/L 49 63 48  AST 15 - 41 U/L 12(L) 15 13(L)  ALT 0 - 44 U/L 10 16 11    B12 level 299 (OSH)-->455  . Lab Results  Component Value Date   LDH 136 03/13/2018         RADIOGRAPHIC STUDIES: I have personally reviewed the radiological images as listed and agreed with the findings in the report. Nm Pet Image Restag (ps) Skull Base To Thigh  Result Date: 01/03/2017 IMPRESSION: 1. There is a new lesion in the hepatic dome which is FDG avid and low in attenuation on  CT imaging consistent with metastatic disease. Another region of uptake in the left hepatic lobe posteriorly demonstrates no CT correlate. A second subtle metastasis is not excluded on today's study. An MRI could better assess for other hepatic metastases if clinically warranted. 2. New metastatic lesion in the left side of T8. 3. New pulmonary nodule in the right upper  lobe. This is too small to characterize but suspicious. Recommend attention on follow-up. No FDG avid nodules or other enlarging nodules. 4. The uptake at the previous left renal artery has almost resolved in the interval and is favored to be post therapeutic. Recommend attention on follow-up. 5. The metastasis in the posterior right hilum seen previously has resolved. Electronically Signed   By: Dorise Bullion III M.D   On: 01/03/2017 11:16   .Dg Chest 2 View  Result Date: 05/05/2018 CLINICAL DATA:  Pain following fall. History of renal cell carcinoma EXAM: CHEST - 2 VIEW COMPARISON:  PET-CT April 03, 2018 FINDINGS: Port-A-Cath tip is in the superior vena cava. No pneumothorax. There is mild atelectatic change in the left lower lobe. Lungs elsewhere are clear. Heart is upper normal in size with pulmonary vascularity normal. There is aortic atherosclerosis. No adenopathy. There is degenerative change in the thoracic spine. Apparent metastatic focus noted in the T7 vertebral body is not well seen by radiography. No acute fracture evident. There is left carotid artery calcification. IMPRESSION: No edema or consolidation. Degenerative changes noted in the thoracic spine. Metastatic focus in the T7 vertebral body is not well appreciated by radiography. There is aortic atherosclerosis. No pneumothorax. There is left carotid artery calcification. Aortic Atherosclerosis (ICD10-I70.0). Electronically Signed   By: Lowella Grip III M.D.   On: 05/05/2018 10:31   Dg Cervical Spine Complete  Result Date: 05/05/2018 CLINICAL DATA:  Pain following fall EXAM: CERVICAL SPINE - COMPLETE 4+ VIEW COMPARISON:  None. FINDINGS: Frontal, lateral, open-mouth odontoid, and bilateral oblique views were obtained. There is no demonstrable fracture. There is minimal anterolisthesis of C3 on C4 and minimal anterolisthesis of C4 on C5. No other spondylolisthesis evident. Prevertebral soft tissues and predental space regions are  normal. There is severe disc space narrowing at C5-6 and C6-7. There are prominent anterior osteophytes at C5, C6, and C7. There is facet hypertrophy with exit foraminal narrowing due to bony hypertrophy at C3-4, C4-5, C5-6, and C6-7 bilaterally, overall somewhat more severe on the right than on the left. There are no erosive changes. No blastic or lytic bone lesions. IMPRESSION: Multifocal osteoarthritic change. Slight spondylolisthesis at C3-4 and C4-5 is felt to be due to underlying spondylosis. No blastic or lytic bone lesions. No fracture. Electronically Signed   By: Lowella Grip III M.D.   On: 05/05/2018 10:36   Dg Shoulder Right  Result Date: 05/05/2018 CLINICAL DATA:  Pain following fall EXAM: RIGHT SHOULDER - 2+ VIEW COMPARISON:  None. FINDINGS: Oblique, Y scapular, and axillary images were obtained. No fracture or dislocation. Mild generalized osteoarthritic change noted. No erosive change. IMPRESSION: Mild generalized osteoarthritic change.  No fracture or dislocation. Electronically Signed   By: Lowella Grip III M.D.   On: 05/05/2018 10:33   Dg Shoulder Left  Result Date: 05/05/2018 CLINICAL DATA:  Pain following fall EXAM: LEFT SHOULDER - 2+ VIEW COMPARISON:  None. FINDINGS: Oblique, Y scapular, and axillary images were obtained. There is no fracture or dislocation. There is mild generalized osteoarthritic change. No erosive change or intra-articular calcification. There is aortic atherosclerosis. IMPRESSION: No acute fracture or dislocation.  Mild generalized osteoarthritic change. No erosive change. Aortic atherosclerosis noted. Aortic Atherosclerosis (ICD10-I70.0). Electronically Signed   By: Lowella Grip III M.D.   On: 05/05/2018 10:32   Dg Humerus Left  Result Date: 05/05/2018 CLINICAL DATA:  Left upper arm pain after a fall. EXAM: LEFT HUMERUS - 2+ VIEW COMPARISON:  Shoulder radiographs of same date FINDINGS: Visualized portion of the left hemithorax is normal. No acute  fracture or dislocation. Degenerate changes of the acromioclavicular joint. IMPRESSION: No acute osseous abnormality. Electronically Signed   By: Abigail Miyamoto M.D.   On: 05/05/2018 12:24   Dg Hip Unilat With Pelvis 2-3 Views Right  Result Date: 05/05/2018 CLINICAL DATA:  Pain following fall EXAM: DG HIP (WITH OR WITHOUT PELVIS) 2-3V RIGHT COMPARISON:  None. FINDINGS: Frontal pelvis as well as frontal and lateral right hip images were obtained. No acute fracture or dislocation. There is mild symmetric narrowing of each hip joint. No erosive changes. No blastic or lytic bone lesions. There is osteoarthritic change in the sacroiliac joints. There is iliac and femoral artery atherosclerotic calcification bilaterally. IMPRESSION: No fracture or dislocation. 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 No erosive changes. Slight symmetric narrowing of each hip joint. Multiple foci of arterial vascular calcifications/atherosclerosis noted. Electronically Signed   By: Lowella Grip III M.D.   On: 05/05/2018 10:34    ASSESSMENT & PLAN:    78 y.o. 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 PET/CT 06/19/2017 -- consistent with partial metabolic response to treatment.  Rt adrenal gland bx - showed clear cell RCC  S/p CYtoreductive left radical nephrectomy and left adrenal gland resection on 08/01/2016 by Dr Alinda Money.  10/16/17 PET/CT revealed Continued improvement, with the T7 metastatic lesion no longer significantly hypermetabolic. The previous right lower lobe pulmonary nodule is even less apparent, perhaps about 2 mm in diameter today; given that this measured 6 mm on 03/28/2017 this probably represents an effectively treated metastatic lesion. Currently no appreciable hypermetabolic activity is identified to suggest active malignancy. Distended gallbladder with gallbladder  wall thickening and gallstones. Correlate clinically in assessing for cholecystitis. Small but abnormal amount of free pelvic fluid, nonspecific. Other imaging findings of potential clinical significance: Chronic ethmoid sinusitis. Aortic Atherosclerosis. Stable 5 mm right middle lobe pulmonary nodule, not hypermetabolic but below sensitive PET-CT size thresholds. Left nephrectomy. Notable pelvic floor laxity with cystocele. Chronic bilateral Sacroiliitis.  04/03/18 PET/CT revealed No evidence for new or progressive hypermetabolic disease on today's study to suggest metastatic progression. 2. Stable appearance of the T7 metastatic lesion without Hypermetabolism. 3. Tiny focus of FDG accumulation identified along the skin of the low right inguinal fold. No associated lesion evident on CT. This may be related to urinary contaminant. 4. Cholelithiasis with similar appearance of diffuse gallbladder wall thickening. 5. Stable 5 mm right middle lobe pulmonary nodule. 6.  Aortic Atherosclerois. 7. Diffuse colonic diverticulosis.   #2 b/l adrenal metastases from [place] s/p left adrenalectomy with adrenal insuff - follows with Dr Buddy Duty.  #3 Small pulmonary lesions -- 10/16/17 PET/CT showed pulmonary less apparent than 03/28/17 PET/CT, decreasing from 72mm to 38mm diameter.   MRI brain shows no evidence of metastatic disease  #4 T7/8 Bone metastases - received Xgeva every 4 weeks from May 2018 to June 2019. 10/16/17 PET/CT revealed improvement with T7 metastatic lesion no longer significantly hypermetabolic.  -on Marchelle Folks  #5 ?liver mets- rpt PET/CT from  10/16/2017 shows no overt evidence of metastatic disease in the liver.  #6 Grade 1 Nausea - improved and intermittent. Hasnt used her anti-emetic as instructed so some nausea and decreased po intake.  #7 Grade 1 Diarrhea - from sutent controlled with prn Imodium.  #8 Hyponatremia - nearly resolved with sodium at 138 - likely related to some element of adrenal  insufficiency, diarrhea, limited by mouth intake. Primarily solute free fluid intake.   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 Buddy Duty -TSH normalized on 11/19/17 to 3.141  #14 HTN - ?control. Patient tends to be anxious and has higher blood pressures in the clinic. She can have increased blood pressure from Sutent as well. Plan: -continued close f/u with her PCP /cardiology regarding the many elements necessary for her care that are not directly related to her oncology care. -ACEI held due to AKI and hyperkalemia - following with cardiology to mx this. Has been started on Amlodipine instead.  #15 Grade 1 mucositis- resolved -Advised the patient to continue use of Magic Mouthwash to aid with nutrition  #16 Newly diagnosed ?CAD - positive cardiac nuclear stress test Following with cardiology and nephrology ? Need for pre-operative cardiac cath  #17 Cholecystitis - on hold due to cardiac issues noted on pre-operative evaluation.  PLAN:  -Discussed pt labwork today, 05/08/18; blood counts are stable. Potassium at 5.5, other chemistries are stable.  -The pt has no prohibitive toxicities from continuing Nivolumab at this time. -Recommend that the pt keep well moisturized, suspecting that patient  does have dry skin -Hydroxyzine as well for itching -Will refer the pt to our outpatient Physical Therapy -Continue Xgeva every 4 weeks -Have held Sutent with considerations of CAD and impending cholecystectomy -Follow up with OBGYN regarding uterine prolapse and concern for urinary retention, which could explain mild increase in Creatinine -Recommended that the pt continue to eat well, drink at least 48-64 oz of water each day, and walk 20-30 minutes each day. -Will see the pt back in 4 weeks    -continue Nivoumab q2weeks with labs - please schedule next 4 doses -continue Xgeva q4weeks -RTC with Dr Irene Limbo in 4 weeks   All of the patients questions were answered with apparent satisfaction. The patient knows to call the clinic with any problems, questions or concerns.  The total time spent in the appt was 25 minutes and more than 50% was on counseling and direct patient cares.   Sullivan Lone MD Dawn AAHIVMS [facility] Hematology/Oncology Physician [facility]  (Office):       [phone] (Work cell):  [phone] (Fax):           [phone]  I, Baldwin Jamaica, am acting as a scribe for Dr. Sullivan Lone.   .I have reviewed the above documentation for accuracy and completeness, and I agree with the above. Suzan Slick  Juleen China MD

## 2018-05-08 ENCOUNTER — Telehealth: Payer: Self-pay

## 2018-05-08 ENCOUNTER — Inpatient Hospital Stay: Payer: Medicare Other

## 2018-05-08 ENCOUNTER — Encounter: Payer: Self-pay | Admitting: Hematology

## 2018-05-08 ENCOUNTER — Inpatient Hospital Stay (HOSPITAL_BASED_OUTPATIENT_CLINIC_OR_DEPARTMENT_OTHER): Payer: Medicare Other | Admitting: Hematology

## 2018-05-08 VITALS — BP 134/61 | HR 58 | Temp 98.4°F | Resp 18 | Ht 59.0 in | Wt 135.8 lb

## 2018-05-08 DIAGNOSIS — E875 Hyperkalemia: Secondary | ICD-10-CM

## 2018-05-08 DIAGNOSIS — C787 Secondary malignant neoplasm of liver and intrahepatic bile duct: Secondary | ICD-10-CM

## 2018-05-08 DIAGNOSIS — Z79899 Other long term (current) drug therapy: Secondary | ICD-10-CM

## 2018-05-08 DIAGNOSIS — E538 Deficiency of other specified B group vitamins: Secondary | ICD-10-CM

## 2018-05-08 DIAGNOSIS — E119 Type 2 diabetes mellitus without complications: Secondary | ICD-10-CM

## 2018-05-08 DIAGNOSIS — C7951 Secondary malignant neoplasm of bone: Secondary | ICD-10-CM

## 2018-05-08 DIAGNOSIS — I252 Old myocardial infarction: Secondary | ICD-10-CM

## 2018-05-08 DIAGNOSIS — E079 Disorder of thyroid, unspecified: Secondary | ICD-10-CM

## 2018-05-08 DIAGNOSIS — Z95828 Presence of other vascular implants and grafts: Secondary | ICD-10-CM

## 2018-05-08 DIAGNOSIS — Z5112 Encounter for antineoplastic immunotherapy: Secondary | ICD-10-CM | POA: Diagnosis not present

## 2018-05-08 DIAGNOSIS — C642 Malignant neoplasm of left kidney, except renal pelvis: Secondary | ICD-10-CM | POA: Diagnosis not present

## 2018-05-08 DIAGNOSIS — E1122 Type 2 diabetes mellitus with diabetic chronic kidney disease: Secondary | ICD-10-CM

## 2018-05-08 DIAGNOSIS — N189 Chronic kidney disease, unspecified: Secondary | ICD-10-CM

## 2018-05-08 DIAGNOSIS — Z87891 Personal history of nicotine dependence: Secondary | ICD-10-CM

## 2018-05-08 DIAGNOSIS — I129 Hypertensive chronic kidney disease with stage 1 through stage 4 chronic kidney disease, or unspecified chronic kidney disease: Secondary | ICD-10-CM

## 2018-05-08 DIAGNOSIS — I700 Atherosclerosis of aorta: Secondary | ICD-10-CM

## 2018-05-08 DIAGNOSIS — E440 Moderate protein-calorie malnutrition: Secondary | ICD-10-CM

## 2018-05-08 DIAGNOSIS — I2510 Atherosclerotic heart disease of native coronary artery without angina pectoris: Secondary | ICD-10-CM

## 2018-05-08 DIAGNOSIS — Z7189 Other specified counseling: Secondary | ICD-10-CM

## 2018-05-08 DIAGNOSIS — M1990 Unspecified osteoarthritis, unspecified site: Secondary | ICD-10-CM

## 2018-05-08 DIAGNOSIS — K649 Unspecified hemorrhoids: Secondary | ICD-10-CM

## 2018-05-08 DIAGNOSIS — E785 Hyperlipidemia, unspecified: Secondary | ICD-10-CM

## 2018-05-08 DIAGNOSIS — I10 Essential (primary) hypertension: Secondary | ICD-10-CM

## 2018-05-08 DIAGNOSIS — K5730 Diverticulosis of large intestine without perforation or abscess without bleeding: Secondary | ICD-10-CM

## 2018-05-08 LAB — CBC WITH DIFFERENTIAL/PLATELET
Abs Immature Granulocytes: 0.08 10*3/uL — ABNORMAL HIGH (ref 0.00–0.07)
Basophils Absolute: 0.1 10*3/uL (ref 0.0–0.1)
Basophils Relative: 1 %
Eosinophils Absolute: 0.2 10*3/uL (ref 0.0–0.5)
Eosinophils Relative: 3 %
HCT: 33.8 % — ABNORMAL LOW (ref 36.0–46.0)
Hemoglobin: 10.9 g/dL — ABNORMAL LOW (ref 12.0–15.0)
Immature Granulocytes: 1 %
Lymphocytes Relative: 17 %
Lymphs Abs: 1.2 10*3/uL (ref 0.7–4.0)
MCH: 29.5 pg (ref 26.0–34.0)
MCHC: 32.2 g/dL (ref 30.0–36.0)
MCV: 91.6 fL (ref 80.0–100.0)
Monocytes Absolute: 0.9 10*3/uL (ref 0.1–1.0)
Monocytes Relative: 13 %
Neutro Abs: 4.6 10*3/uL (ref 1.7–7.7)
Neutrophils Relative %: 65 %
Platelets: 215 10*3/uL (ref 150–400)
RBC: 3.69 MIL/uL — ABNORMAL LOW (ref 3.87–5.11)
RDW: 14.2 % (ref 11.5–15.5)
WBC: 7 10*3/uL (ref 4.0–10.5)
nRBC: 0 % (ref 0.0–0.2)

## 2018-05-08 LAB — CMP (CANCER CENTER ONLY)
ALT: 10 U/L (ref 0–44)
AST: 12 U/L — ABNORMAL LOW (ref 15–41)
Albumin: 3.8 g/dL (ref 3.5–5.0)
Alkaline Phosphatase: 49 U/L (ref 38–126)
Anion gap: 8 (ref 5–15)
BUN: 31 mg/dL — ABNORMAL HIGH (ref 8–23)
CO2: 25 mmol/L (ref 22–32)
Calcium: 9.8 mg/dL (ref 8.9–10.3)
Chloride: 102 mmol/L (ref 98–111)
Creatinine: 1.99 mg/dL — ABNORMAL HIGH (ref 0.44–1.00)
GFR, Est AFR Am: 28 mL/min — ABNORMAL LOW (ref >60)
GFR, Estimated: 24 mL/min — ABNORMAL LOW (ref >60)
Glucose, Bld: 177 mg/dL — ABNORMAL HIGH (ref 70–99)
Potassium: 5.5 mmol/L — ABNORMAL HIGH (ref 3.5–5.1)
Sodium: 135 mmol/L (ref 135–145)
Total Bilirubin: 0.5 mg/dL (ref 0.3–1.2)
Total Protein: 6.6 g/dL (ref 6.5–8.1)

## 2018-05-08 MED ORDER — HEPARIN SOD (PORK) LOCK FLUSH 100 UNIT/ML IV SOLN
500.0000 [IU] | Freq: Once | INTRAVENOUS | Status: AC | PRN
  Administered 2018-05-08: 500 [IU]
  Filled 2018-05-08: qty 5

## 2018-05-08 MED ORDER — SODIUM CHLORIDE 0.9% FLUSH
10.0000 mL | INTRAVENOUS | Status: DC | PRN
  Administered 2018-05-08: 10 mL
  Filled 2018-05-08: qty 10

## 2018-05-08 MED ORDER — SODIUM CHLORIDE 0.9 % IV SOLN
Freq: Once | INTRAVENOUS | Status: AC
  Administered 2018-05-08: 14:00:00 via INTRAVENOUS
  Filled 2018-05-08: qty 250

## 2018-05-08 MED ORDER — SODIUM CHLORIDE 0.9 % IV SOLN
240.0000 mg | Freq: Once | INTRAVENOUS | Status: AC
  Administered 2018-05-08: 240 mg via INTRAVENOUS
  Filled 2018-05-08: qty 24

## 2018-05-08 MED ORDER — SODIUM CHLORIDE 0.9% FLUSH
10.0000 mL | Freq: Once | INTRAVENOUS | Status: AC
  Administered 2018-05-08: 10 mL
  Filled 2018-05-08: qty 10

## 2018-05-08 MED ORDER — DENOSUMAB 120 MG/1.7ML ~~LOC~~ SOLN
120.0000 mg | Freq: Once | SUBCUTANEOUS | Status: AC
  Administered 2018-05-08: 120 mg via SUBCUTANEOUS

## 2018-05-08 MED ORDER — DENOSUMAB 120 MG/1.7ML ~~LOC~~ SOLN
SUBCUTANEOUS | Status: AC
  Filled 2018-05-08: qty 1.7

## 2018-05-08 NOTE — Patient Instructions (Signed)
[facility] Discharge Instructions for Patients Receiving Chemotherapy  Today you received the following chemotherapy agents Opdivo  To help prevent nausea and vomiting after your treatment, we encourage you to take your nausea medication as directed   If you develop nausea and vomiting that is not controlled by your nausea medication, call the clinic.   BELOW ARE SYMPTOMS THAT SHOULD BE REPORTED IMMEDIATELY:  *FEVER GREATER THAN 100.5 F  *CHILLS WITH OR WITHOUT FEVER  NAUSEA AND VOMITING THAT IS NOT CONTROLLED WITH YOUR NAUSEA MEDICATION  *UNUSUAL SHORTNESS OF BREATH  *UNUSUAL BRUISING OR BLEEDING  TENDERNESS IN MOUTH AND THROAT WITH OR WITHOUT PRESENCE OF ULCERS  *URINARY PROBLEMS  *BOWEL PROBLEMS  UNUSUAL RASH Items with * indicate a potential emergency and should be followed up as soon as possible.  Feel free to call the clinic should you have any questions or concerns. The clinic phone number is [phone].  Please show the CHEMO ALERT CARD at check-in to the Emergency Department and triage nurse.   

## 2018-05-08 NOTE — Telephone Encounter (Signed)
Per [name] it is ok to treat today with Creatinine of 1.99

## 2018-05-13 ENCOUNTER — Telehealth: Payer: Self-pay

## 2018-05-13 NOTE — Telephone Encounter (Signed)
Spoke with patient and she will be picking up new schedule on her next appointment date declined calender in mail. Per 1/17 los

## 2018-05-22 ENCOUNTER — Inpatient Hospital Stay: Payer: Medicare Other

## 2018-05-22 VITALS — BP 143/74 | HR 58 | Temp 98.0°F | Resp 18

## 2018-05-22 DIAGNOSIS — Z7189 Other specified counseling: Secondary | ICD-10-CM

## 2018-05-22 DIAGNOSIS — C642 Malignant neoplasm of left kidney, except renal pelvis: Secondary | ICD-10-CM

## 2018-05-22 DIAGNOSIS — Z5112 Encounter for antineoplastic immunotherapy: Secondary | ICD-10-CM | POA: Diagnosis not present

## 2018-05-22 DIAGNOSIS — C7951 Secondary malignant neoplasm of bone: Secondary | ICD-10-CM

## 2018-05-22 LAB — CMP (CANCER CENTER ONLY)
ALT: 58 U/L — ABNORMAL HIGH (ref 0–44)
AST: 14 U/L — ABNORMAL LOW (ref 15–41)
Albumin: 4.1 g/dL (ref 3.5–5.0)
Alkaline Phosphatase: 58 U/L (ref 38–126)
Anion gap: 9 (ref 5–15)
BUN: 33 mg/dL — ABNORMAL HIGH (ref 8–23)
CO2: 22 mmol/L (ref 22–32)
Calcium: 10.3 mg/dL (ref 8.9–10.3)
Chloride: 103 mmol/L (ref 98–111)
Creatinine: 1.92 mg/dL — ABNORMAL HIGH (ref 0.44–1.00)
GFR, Est AFR Am: 29 mL/min — ABNORMAL LOW (ref >60)
GFR, Estimated: 25 mL/min — ABNORMAL LOW (ref >60)
Glucose, Bld: 145 mg/dL — ABNORMAL HIGH (ref 70–99)
Potassium: 5.2 mmol/L — ABNORMAL HIGH (ref 3.5–5.1)
Sodium: 134 mmol/L — ABNORMAL LOW (ref 135–145)
Total Bilirubin: 0.5 mg/dL (ref 0.3–1.2)
Total Protein: 6.8 g/dL (ref 6.5–8.1)

## 2018-05-22 LAB — CBC WITH DIFFERENTIAL/PLATELET
Abs Immature Granulocytes: 0.09 10*3/uL — ABNORMAL HIGH (ref 0.00–0.07)
Basophils Absolute: 0.1 10*3/uL (ref 0.0–0.1)
Basophils Relative: 1 %
Eosinophils Absolute: 0.2 10*3/uL (ref 0.0–0.5)
Eosinophils Relative: 3 %
HCT: 34.9 % — ABNORMAL LOW (ref 36.0–46.0)
Hemoglobin: 11.4 g/dL — ABNORMAL LOW (ref 12.0–15.0)
Immature Granulocytes: 1 %
Lymphocytes Relative: 24 %
Lymphs Abs: 1.6 10*3/uL (ref 0.7–4.0)
MCH: 30 pg (ref 26.0–34.0)
MCHC: 32.7 g/dL (ref 30.0–36.0)
MCV: 91.8 fL (ref 80.0–100.0)
Monocytes Absolute: 0.7 10*3/uL (ref 0.1–1.0)
Monocytes Relative: 10 %
Neutro Abs: 4.2 10*3/uL (ref 1.7–7.7)
Neutrophils Relative %: 61 %
Platelets: 234 10*3/uL (ref 150–400)
RBC: 3.8 MIL/uL — ABNORMAL LOW (ref 3.87–5.11)
RDW: 14.2 % (ref 11.5–15.5)
WBC: 6.9 10*3/uL (ref 4.0–10.5)
nRBC: 0 % (ref 0.0–0.2)

## 2018-05-22 MED ORDER — SODIUM CHLORIDE 0.9 % IV SOLN
Freq: Once | INTRAVENOUS | Status: AC
  Administered 2018-05-22: 15:00:00 via INTRAVENOUS
  Filled 2018-05-22: qty 250

## 2018-05-22 MED ORDER — SODIUM CHLORIDE 0.9 % IV SOLN
240.0000 mg | Freq: Once | INTRAVENOUS | Status: AC
  Administered 2018-05-22: 240 mg via INTRAVENOUS
  Filled 2018-05-22: qty 24

## 2018-05-22 MED ORDER — SODIUM CHLORIDE 0.9% FLUSH
10.0000 mL | INTRAVENOUS | Status: DC | PRN
  Administered 2018-05-22: 10 mL
  Filled 2018-05-22: qty 10

## 2018-05-22 MED ORDER — HEPARIN SOD (PORK) LOCK FLUSH 100 UNIT/ML IV SOLN
500.0000 [IU] | Freq: Once | INTRAVENOUS | Status: AC | PRN
  Administered 2018-05-22: 500 [IU]
  Filled 2018-05-22: qty 5

## 2018-05-22 NOTE — Patient Instructions (Signed)

## 2018-05-22 NOTE — Progress Notes (Signed)
Per [name] patient OK to treat with today's labs

## 2018-05-22 NOTE — Patient Instructions (Signed)
[facility] Discharge Instructions for Patients Receiving Chemotherapy  Today you received the following chemotherapy agents: Nivolumab (Opdivo)  To help prevent nausea and vomiting after your treatment, we encourage you to take your nausea medication as directed.   If you develop nausea and vomiting that is not controlled by your nausea medication, call the clinic.   BELOW ARE SYMPTOMS THAT SHOULD BE REPORTED IMMEDIATELY:  *FEVER GREATER THAN 100.5 F  *CHILLS WITH OR WITHOUT FEVER  NAUSEA AND VOMITING THAT IS NOT CONTROLLED WITH YOUR NAUSEA MEDICATION  *UNUSUAL SHORTNESS OF BREATH  *UNUSUAL BRUISING OR BLEEDING  TENDERNESS IN MOUTH AND THROAT WITH OR WITHOUT PRESENCE OF ULCERS  *URINARY PROBLEMS  *BOWEL PROBLEMS  UNUSUAL RASH Items with * indicate a potential emergency and should be followed up as soon as possible.  Feel free to call the clinic should you have any questions or concerns. The clinic phone number is [phone].  Please show the CHEMO ALERT CARD at check-in to the Emergency Department and triage nurse.  

## 2018-05-25 ENCOUNTER — Telehealth: Payer: Self-pay | Admitting: *Deleted

## 2018-05-25 NOTE — Telephone Encounter (Signed)
Referral for Outpatient PT faxed to [facility] [phone]. Fax confirmation received

## 2018-06-04 NOTE — Progress Notes (Signed)
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 The patient's last visit with [name] was on 05/08/18. The pt reports that she is doing well overall.  The pt reports that she has not developed any new concerns in the interim. She notes that she has been using a lidocaine patch for her lower back pain. She notes that her potassium has recently been high and is following up with her PCP Dr. Sabra Heck regarding this. She notes that she is limiting her consumption of potassium rich foods.   The pt notes that she has continued tolerating Nivolumab well and denies diarrhea, skin rashes, fevers, and chills.   She notes that she is hoping to begin PT soon. She notes that she has been feeling tired recently as she hasn't been sleeping well.  Lab results today (06/05/18) of CBC w/diff is as follows: all values are WNL except for HGB at 11.9. 06/05/18 TSH is 5.9 down from 12  On review of systems, pt reports constipation, feeling more tired, eating well, and denies diarrhea, skin rashes, fevers, chills, mouth sores, abdominal pain, and any other symptoms.  MEDICAL HISTORY:    Hypertension Dyslipidemia Osteoarthritis Ex-smoker Coronary artery disease Thyroid disorder-was apparently on levothyroxine 25 g daily which has subsequently been discontinued . Diabetes Mitral regurgitation B12 deficiency  hiatal hernia with esophagitis  Myocardial infarction in 1991 no interventions   SURGICAL HISTORY: No reported past surgeries EGD 05/01/2016  Dr. Earley Brooke Colonoscopy 03/2016 Dr. Earley Brooke  SOCIAL HISTORY: Social History   Socioeconomic History  . Marital status: Married    Spouse name: Not on file  . Number of children: Not on file  . Years of education: Not on file  . Highest education level: Not on file  Occupational History  . Not on file  Social Needs  . Financial resource strain: Not on file  . Food insecurity:    Worry: Not on file    Inability: Not on file  . Transportation needs:    Medical: Not on file    Non-medical: Not on file  Tobacco Use  . Smoking status: Former Smoker    Years: 8.00    Last attempt to quit: 06/18/1964    Years since quitting: 54.0  . Smokeless tobacco: Never Used  Substance and Sexual Activity  . Alcohol use: No  . Drug use: No  . Sexual activity: Not on file  Lifestyle  . Physical activity:    Days per week: Not on file    Minutes per session: Not on file  . Stress: Not on file  Relationships  . Social connections:    Talks on phone: Not on file    Gets together: Not on file    Attends religious service: Not on file    Active member of club or organization: Not on file    Attends meetings of clubs or organizations: Not on file    Relationship status: Not on file  . Intimate partner violence:    Fear of current or ex partner:  Not on file    Emotionally abused: Not on file    Physically abused: Not on file    Forced sexual activity: Not on file  Other Topics Concern  . Not on file  Social History Narrative  . Not on file   Patient lives in [place] Works as a Solicitor part-time Ex-smoker quit long time ago In 1961 . Smoked 2 packs per day for 8 years prior to that .  or a lot of stress since her daughter is also getting treated for stage IV uterine cancer.  FAMILY HISTORY:  Mother deceased Father with asthma and emphysema died at 14 years with the MI. Brother deceased of heart disease   ALLERGIES:  is allergic to adhesive [tape]; nsaids; and  statins.  MEDICATIONS:  Current Outpatient Medications  Medication Sig Dispense Refill  . acetaminophen (TYLENOL) 500 MG tablet Take 1,000 mg by mouth 2 (two) times daily.     Marland Kitchen amLODipine (NORVASC) 5 MG tablet Take 5 mg by mouth daily.  6  . atorvastatin (LIPITOR) 40 MG tablet Take 40 mg by mouth daily at 6 PM.     . Calcium 600-200 MG-UNIT tablet Take 1 tablet by mouth daily.    . carvedilol (COREG) 6.25 MG tablet Take 6.25 mg by mouth 2 (two) times daily.  6  . clobetasol ointment (TEMOVATE) 5.80 % Apply 1 application topically 2 (two) times daily.    Marland Kitchen denosumab (XGEVA) 120 MG/1.7ML SOLN injection Inject 120 mg into the skin every 6 (six) weeks.    . ferrous sulfate 324 (65 Fe) MG TBEC Take 1 tablet by mouth daily.    . hydrocortisone (CORTEF) 10 MG tablet Take 10 mg by mouth 2 (two) times daily.     Marland Kitchen levothyroxine (SYNTHROID, LEVOTHROID) 50 MCG tablet Take 50 mcg by mouth daily before breakfast.     . lidocaine-prilocaine (EMLA) cream Apply to affected area once 30 g 3  . Nivolumab (OPDIVO IV) Inject 1 Dose into the vein every 14 (fourteen) days.    . pantoprazole (PROTONIX) 20 MG tablet Take 20 mg by mouth daily.    . traMADol (ULTRAM) 50 MG tablet Take 0.5-1 tablets (25-50 mg total) by mouth every 6 (six) hours as needed. 60 tablet 0   No current facility-administered medications for this visit.     REVIEW OF SYSTEMS:    A 10+ POINT REVIEW OF SYSTEMS WAS OBTAINED including neurology, dermatology, psychiatry, cardiac, respiratory, lymph, extremities, GI, GU, Musculoskeletal, constitutional, breasts, reproductive, HEENT.  All pertinent positives are noted in the HPI.  All others are negative.   PHYSICAL EXAMINATION:  ECOG PERFORMANCE STATUS: 1 - Symptomatic but completely ambulatory   Vitals:   06/05/18 0837  BP: 132/83  Pulse: 75  Resp: 18  Temp: 98.1 F (36.7 C)  SpO2: 100%   Filed Weights   06/05/18 0837  Weight: 133 lb 4.8 oz (60.5 kg)   .Body mass index is 26.92  kg/m.   GENERAL:alert, in no acute distress and comfortable SKIN: no acute rashes, no significant lesions EYES: conjunctiva are pink and non-injected, sclera anicteric OROPHARYNX: MMM, no exudates, no oropharyngeal erythema or ulceration NECK: supple, no JVD LYMPH:  no palpable lymphadenopathy in the cervical, axillary or inguinal regions LUNGS: clear to auscultation b/l with normal respiratory effort HEART: regular rate & rhythm ABDOMEN:  normoactive bowel sounds , non tender, not distended. No palpable hepatosplenomegaly.  Extremity: no pedal edema PSYCH: alert & oriented x 3 with fluent speech  NEURO: no focal motor/sensory deficits   LABORATORY DATA:  I have reviewed the data as listed  . CBC Latest Ref Rng & Units 06/05/2018 05/22/2018 05/08/2018  WBC 4.0 - 10.5 K/uL 6.7 6.9 7.0  Hemoglobin 12.0 - 15.0 g/dL 11.9(L) 11.4(L) 10.9(L)  Hematocrit 36.0 - 46.0 % 37.3 34.9(L) 33.8(L)  Platelets 150 - 400 K/uL 283 234 215    CBC    Component Value Date/Time   WBC 6.7 06/05/2018 0819   RBC 4.00 06/05/2018 0819   HGB 11.9 (L) 06/05/2018 0819   HGB 11.2 (L) 02/27/2018 0835   HGB 11.2 (L) 04/04/2017 0830   HCT 37.3 06/05/2018 0819   HCT 35.2 04/04/2017 0830   PLT 283 06/05/2018 0819   PLT 219 02/27/2018 0835   PLT 250 04/04/2017 0830   MCV 93.3 06/05/2018 0819   MCV 107.6 (H) 04/04/2017 0830   MCH 29.8 06/05/2018 0819   MCHC 31.9 06/05/2018 0819   RDW 14.2 06/05/2018 0819   RDW 14.0 04/04/2017 0830   LYMPHSABS 1.3 06/05/2018 0819   LYMPHSABS 1.6 04/04/2017 0830   MONOABS 0.8 06/05/2018 0819   MONOABS 0.4 04/04/2017 0830   EOSABS 0.3 06/05/2018 0819   EOSABS 0.1 04/04/2017 0830   BASOSABS 0.1 06/05/2018 0819   BASOSABS 0.0 04/04/2017 0830     . CMP Latest Ref Rng & Units 06/05/2018 05/22/2018 05/08/2018  Glucose 70 - 99 mg/dL 148(H) 145(H) 177(H)  BUN 8 - 23 mg/dL 23 33(H) 31(H)  Creatinine 0.44 - 1.00 mg/dL 1.73(H) 1.92(H) 1.99(H)  Sodium 135 - 145 mmol/L 137 134(L)  135  Potassium 3.5 - 5.1 mmol/L 4.9 5.2(H) 5.5(H)  Chloride 98 - 111 mmol/L 106 103 102  CO2 22 - 32 mmol/L 21(L) 22 25  Calcium 8.9 - 10.3 mg/dL 9.8 10.3 9.8  Total Protein 6.5 - 8.1 g/dL 6.9 6.8 6.6  Total Bilirubin 0.3 - 1.2 mg/dL 0.4 0.5 0.5  Alkaline Phos 38 - 126 U/L 63 58 49  AST 15 - 41 U/L 23 14(L) 12(L)  ALT 0 - 44 U/L 34 58(H) 10   B12 level 299 (OSH)-->455  . Lab Results  Component Value Date   LDH 136 03/13/2018         RADIOGRAPHIC STUDIES: I have personally reviewed the radiological images as listed and agreed with the findings in the report. Nm Pet Image Restag (ps) Skull Base To Thigh  Result Date: 01/03/2017 IMPRESSION: 1. There is a new lesion in the hepatic dome which is FDG avid and low in attenuation on CT imaging consistent with metastatic disease. Another region of uptake in the left hepatic lobe posteriorly demonstrates no CT correlate. A second subtle metastasis is not excluded on today's study. An MRI could better assess for other hepatic metastases if clinically warranted. 2. New metastatic lesion in the left side of T8. 3. New pulmonary nodule in the right upper lobe. This is too small to characterize but suspicious. Recommend attention on follow-up. No FDG avid nodules or other enlarging nodules. 4. The uptake at the previous left renal artery has almost resolved in the interval and is favored to be post therapeutic. Recommend attention on follow-up. 5. The metastasis in the posterior right hilum seen previously has resolved. Electronically Signed   By: Dorise Bullion III M.D   On: 01/03/2017 11:16   .No results found.  ASSESSMENT & PLAN:    78 y.o. 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 PET/CT 06/19/2017 -- consistent with partial metabolic response to treatment.  Rt adrenal gland bx - showed clear cell RCC  S/p  CYtoreductive left radical nephrectomy and left adrenal gland resection on 08/01/2016 by Dr Alinda Money.  10/16/17 PET/CT revealed Continued improvement, with the T7 metastatic lesion no longer significantly hypermetabolic. The previous right lower lobe pulmonary nodule is even less apparent, perhaps about 2 mm in diameter today; given that this measured 6 mm on 03/28/2017 this probably represents an effectively treated metastatic lesion. Currently no appreciable hypermetabolic activity is identified to suggest active malignancy. Distended gallbladder with gallbladder wall thickening and gallstones. Correlate clinically in assessing for cholecystitis. Small but abnormal amount of free pelvic fluid, nonspecific. Other imaging findings of potential clinical significance: Chronic ethmoid sinusitis. Aortic Atherosclerosis. Stable 5 mm right middle lobe pulmonary nodule, not hypermetabolic but below sensitive PET-CT size thresholds. Left nephrectomy. Notable pelvic floor laxity with cystocele. Chronic bilateral Sacroiliitis.  04/03/18 PET/CT revealed No evidence for new or progressive hypermetabolic disease on today's study to suggest metastatic progression. 2. Stable appearance of the T7 metastatic lesion without Hypermetabolism. 3. Tiny focus of FDG accumulation identified along the skin of the low right inguinal fold. No associated lesion evident on CT. This may be related to urinary contaminant. 4. Cholelithiasis with similar appearance of diffuse gallbladder wall thickening. 5. Stable 5 mm right middle lobe pulmonary nodule. 6.  Aortic Atherosclerois. 7. Diffuse colonic diverticulosis.   #2 b/l adrenal metastases from [place] s/p left adrenalectomy with adrenal insuff - follows with Dr Buddy Duty.  #3 Small pulmonary lesions -- 10/16/17 PET/CT showed pulmonary less apparent than 03/28/17 PET/CT, decreasing from 52mm to 78mm diameter.   MRI brain shows no evidence of metastatic disease  #4 T7/8 Bone metastases - received  Xgeva every 4 weeks from May 2018 to June 2019. 10/16/17 PET/CT revealed improvement with T7 metastatic lesion no longer significantly hypermetabolic.  -on Marchelle Folks  #5 ?liver mets- rpt PET/CT from 10/16/2017 shows no overt evidence of metastatic disease in the liver.  #6 Grade 1 Nausea - improved and intermittent. Hasnt used her anti-emetic as instructed so some nausea and decreased po intake.  #7 Grade 1 Diarrhea - resolved  #8 Hyponatremia -  resolved with sodium at 137 - likely related to some element of adrenal insufficiency, diarrhea, limited by mouth intake. Primarily solute free fluid intake.   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 Buddy Duty  #14 HTN - ?control. Patient tends to be anxious and has higher blood pressures in the clinic. She can have increased blood pressure from Sutent as well. Plan: -continued close f/u with her PCP /cardiology regarding the many elements necessary for her care that are not directly related to her oncology care. -ACEI held due to AKI and hyperkalemia - following with cardiology to  mx this. Has been started on Amlodipine instead.  #15 Grade 1 mucositis- resolved -Advised the patient to continue use of Magic Mouthwash to aid with nutrition  #16 Newly  diagnosed ?CAD - positive cardiac nuclear stress test Following with cardiology and nephrology ? Need for pre-operative cardiac cath  #17 Cholecystitis - on hold due to cardiac issues noted on pre-operative evaluation.  PLAN:  -Discussed pt labwork today, 06/05/18; HGB has continued to improve, now to 11.9. Other blood counts have normalized. -The pt has no prohibitive toxicities from continuing maintenance Nivolumab at this time. -Recommend using Senna or Miralax for constipation -Continue with OTC Lidocaine patch for lower back pain  -Recommend that the pt keep well moisturized, suspecting that patient does have dry skin -Hydroxyzine as well for itching -Did refer the pt to our outpatient Physical Therapy -Continue Xgeva every 4 weeks -Have held Sutent with considerations of CAD and impending cholecystectomy -Follow up with OBGYN regarding uterine prolapse and concern for urinary retention, which could explain mild increase in Creatinine -Recommended that the pt continue to eat well, drink at least 48-64 oz of water each day, and walk 20-30 minutes each day.  -Will see the pt back in 4 weeks    -Continue Nivolumab q2weeks as ordered with labs- plz schedule next 4 doses. -RTC with Dr Irene Limbo in 4 weeks   All of the patients questions were answered with apparent satisfaction. The patient knows to call the clinic with any problems, questions or concerns.  The total time spent in the appt was 25 minutes and more than 50% was on counseling and direct patient cares.   Sullivan Lone MD Roland AAHIVMS [facility] Hematology/Oncology Physician [facility]  (Office):       [phone] (Work cell):  [phone] (Fax):           [phone]  I, Baldwin Jamaica, am acting as a scribe for Dr. Sullivan Lone.   .I have reviewed the above documentation for accuracy and completeness, and I agree with the above. Brunetta Genera MD

## 2018-06-05 ENCOUNTER — Inpatient Hospital Stay: Payer: Medicare Other

## 2018-06-05 ENCOUNTER — Ambulatory Visit: Payer: Medicare Other

## 2018-06-05 ENCOUNTER — Ambulatory Visit: Payer: Medicare Other | Admitting: Hematology

## 2018-06-05 ENCOUNTER — Inpatient Hospital Stay: Payer: Medicare Other | Attending: Hematology

## 2018-06-05 ENCOUNTER — Inpatient Hospital Stay (HOSPITAL_BASED_OUTPATIENT_CLINIC_OR_DEPARTMENT_OTHER): Payer: Medicare Other | Admitting: Hematology

## 2018-06-05 ENCOUNTER — Other Ambulatory Visit: Payer: Medicare Other

## 2018-06-05 ENCOUNTER — Telehealth: Payer: Self-pay | Admitting: Hematology

## 2018-06-05 VITALS — BP 132/83 | HR 75 | Temp 98.1°F | Resp 18 | Ht 59.0 in | Wt 133.3 lb

## 2018-06-05 DIAGNOSIS — Z5112 Encounter for antineoplastic immunotherapy: Secondary | ICD-10-CM | POA: Insufficient documentation

## 2018-06-05 DIAGNOSIS — R918 Other nonspecific abnormal finding of lung field: Secondary | ICD-10-CM | POA: Insufficient documentation

## 2018-06-05 DIAGNOSIS — T464X5A Adverse effect of angiotensin-converting-enzyme inhibitors, initial encounter: Secondary | ICD-10-CM

## 2018-06-05 DIAGNOSIS — I129 Hypertensive chronic kidney disease with stage 1 through stage 4 chronic kidney disease, or unspecified chronic kidney disease: Secondary | ICD-10-CM

## 2018-06-05 DIAGNOSIS — K649 Unspecified hemorrhoids: Secondary | ICD-10-CM

## 2018-06-05 DIAGNOSIS — M1990 Unspecified osteoarthritis, unspecified site: Secondary | ICD-10-CM | POA: Insufficient documentation

## 2018-06-05 DIAGNOSIS — Z87891 Personal history of nicotine dependence: Secondary | ICD-10-CM | POA: Insufficient documentation

## 2018-06-05 DIAGNOSIS — N189 Chronic kidney disease, unspecified: Secondary | ICD-10-CM | POA: Diagnosis not present

## 2018-06-05 DIAGNOSIS — C7951 Secondary malignant neoplasm of bone: Secondary | ICD-10-CM

## 2018-06-05 DIAGNOSIS — C642 Malignant neoplasm of left kidney, except renal pelvis: Secondary | ICD-10-CM | POA: Diagnosis not present

## 2018-06-05 DIAGNOSIS — E119 Type 2 diabetes mellitus without complications: Secondary | ICD-10-CM

## 2018-06-05 DIAGNOSIS — E875 Hyperkalemia: Secondary | ICD-10-CM | POA: Diagnosis not present

## 2018-06-05 DIAGNOSIS — K449 Diaphragmatic hernia without obstruction or gangrene: Secondary | ICD-10-CM | POA: Insufficient documentation

## 2018-06-05 DIAGNOSIS — M549 Dorsalgia, unspecified: Secondary | ICD-10-CM | POA: Insufficient documentation

## 2018-06-05 DIAGNOSIS — I10 Essential (primary) hypertension: Secondary | ICD-10-CM

## 2018-06-05 DIAGNOSIS — Z700 Counseling related to sexual attitude: Secondary | ICD-10-CM

## 2018-06-05 DIAGNOSIS — N8110 Cystocele, unspecified: Secondary | ICD-10-CM | POA: Diagnosis not present

## 2018-06-05 DIAGNOSIS — C7972 Secondary malignant neoplasm of left adrenal gland: Secondary | ICD-10-CM | POA: Diagnosis not present

## 2018-06-05 DIAGNOSIS — E440 Moderate protein-calorie malnutrition: Secondary | ICD-10-CM | POA: Insufficient documentation

## 2018-06-05 DIAGNOSIS — E871 Hypo-osmolality and hyponatremia: Secondary | ICD-10-CM

## 2018-06-05 DIAGNOSIS — E2740 Unspecified adrenocortical insufficiency: Secondary | ICD-10-CM | POA: Diagnosis not present

## 2018-06-05 DIAGNOSIS — K5730 Diverticulosis of large intestine without perforation or abscess without bleeding: Secondary | ICD-10-CM

## 2018-06-05 DIAGNOSIS — E1122 Type 2 diabetes mellitus with diabetic chronic kidney disease: Secondary | ICD-10-CM | POA: Diagnosis not present

## 2018-06-05 DIAGNOSIS — Z79899 Other long term (current) drug therapy: Secondary | ICD-10-CM | POA: Insufficient documentation

## 2018-06-05 DIAGNOSIS — E785 Hyperlipidemia, unspecified: Secondary | ICD-10-CM

## 2018-06-05 DIAGNOSIS — I2510 Atherosclerotic heart disease of native coronary artery without angina pectoris: Secondary | ICD-10-CM

## 2018-06-05 DIAGNOSIS — I252 Old myocardial infarction: Secondary | ICD-10-CM | POA: Insufficient documentation

## 2018-06-05 DIAGNOSIS — Z95828 Presence of other vascular implants and grafts: Secondary | ICD-10-CM

## 2018-06-05 DIAGNOSIS — M461 Sacroiliitis, not elsewhere classified: Secondary | ICD-10-CM

## 2018-06-05 DIAGNOSIS — Z905 Acquired absence of kidney: Secondary | ICD-10-CM | POA: Insufficient documentation

## 2018-06-05 DIAGNOSIS — E039 Hypothyroidism, unspecified: Secondary | ICD-10-CM

## 2018-06-05 DIAGNOSIS — Z7189 Other specified counseling: Secondary | ICD-10-CM

## 2018-06-05 DIAGNOSIS — I700 Atherosclerosis of aorta: Secondary | ICD-10-CM | POA: Diagnosis not present

## 2018-06-05 LAB — CBC WITH DIFFERENTIAL/PLATELET
Abs Immature Granulocytes: 0.06 10*3/uL (ref 0.00–0.07)
Basophils Absolute: 0.1 10*3/uL (ref 0.0–0.1)
Basophils Relative: 1 %
Eosinophils Absolute: 0.3 10*3/uL (ref 0.0–0.5)
Eosinophils Relative: 4 %
HCT: 37.3 % (ref 36.0–46.0)
Hemoglobin: 11.9 g/dL — ABNORMAL LOW (ref 12.0–15.0)
Immature Granulocytes: 1 %
Lymphocytes Relative: 19 %
Lymphs Abs: 1.3 10*3/uL (ref 0.7–4.0)
MCH: 29.8 pg (ref 26.0–34.0)
MCHC: 31.9 g/dL (ref 30.0–36.0)
MCV: 93.3 fL (ref 80.0–100.0)
Monocytes Absolute: 0.8 10*3/uL (ref 0.1–1.0)
Monocytes Relative: 12 %
Neutro Abs: 4.3 10*3/uL (ref 1.7–7.7)
Neutrophils Relative %: 63 %
Platelets: 283 10*3/uL (ref 150–400)
RBC: 4 MIL/uL (ref 3.87–5.11)
RDW: 14.2 % (ref 11.5–15.5)
WBC: 6.7 10*3/uL (ref 4.0–10.5)
nRBC: 0 % (ref 0.0–0.2)

## 2018-06-05 LAB — CMP (CANCER CENTER ONLY)
ALT: 34 U/L (ref 0–44)
AST: 23 U/L (ref 15–41)
Albumin: 4 g/dL (ref 3.5–5.0)
Alkaline Phosphatase: 63 U/L (ref 38–126)
Anion gap: 10 (ref 5–15)
BUN: 23 mg/dL (ref 8–23)
CO2: 21 mmol/L — ABNORMAL LOW (ref 22–32)
Calcium: 9.8 mg/dL (ref 8.9–10.3)
Chloride: 106 mmol/L (ref 98–111)
Creatinine: 1.73 mg/dL — ABNORMAL HIGH (ref 0.44–1.00)
GFR, Est AFR Am: 33 mL/min — ABNORMAL LOW (ref >60)
GFR, Estimated: 28 mL/min — ABNORMAL LOW (ref >60)
Glucose, Bld: 148 mg/dL — ABNORMAL HIGH (ref 70–99)
Potassium: 4.9 mmol/L (ref 3.5–5.1)
Sodium: 137 mmol/L (ref 135–145)
Total Bilirubin: 0.4 mg/dL (ref 0.3–1.2)
Total Protein: 6.9 g/dL (ref 6.5–8.1)

## 2018-06-05 LAB — TSH: TSH: 5.905 u[IU]/mL — ABNORMAL HIGH (ref 0.308–3.960)

## 2018-06-05 MED ORDER — HEPARIN SOD (PORK) LOCK FLUSH 100 UNIT/ML IV SOLN
500.0000 [IU] | Freq: Once | INTRAVENOUS | Status: DC | PRN
  Filled 2018-06-05: qty 5

## 2018-06-05 MED ORDER — SODIUM CHLORIDE 0.9 % IV SOLN
240.0000 mg | Freq: Once | INTRAVENOUS | Status: AC
  Administered 2018-06-05: 240 mg via INTRAVENOUS
  Filled 2018-06-05: qty 24

## 2018-06-05 MED ORDER — DENOSUMAB 120 MG/1.7ML ~~LOC~~ SOLN
120.0000 mg | Freq: Once | SUBCUTANEOUS | Status: AC
  Administered 2018-06-05: 120 mg via SUBCUTANEOUS

## 2018-06-05 MED ORDER — SODIUM CHLORIDE 0.9 % IV SOLN
Freq: Once | INTRAVENOUS | Status: AC
  Administered 2018-06-05: 10:00:00 via INTRAVENOUS
  Filled 2018-06-05: qty 250

## 2018-06-05 MED ORDER — SODIUM CHLORIDE 0.9% FLUSH
10.0000 mL | INTRAVENOUS | Status: DC | PRN
  Filled 2018-06-05: qty 10

## 2018-06-05 MED ORDER — SODIUM CHLORIDE 0.9% FLUSH
10.0000 mL | Freq: Once | INTRAVENOUS | Status: AC
  Administered 2018-06-05: 10 mL
  Filled 2018-06-05: qty 10

## 2018-06-05 MED ORDER — DENOSUMAB 120 MG/1.7ML ~~LOC~~ SOLN
SUBCUTANEOUS | Status: AC
  Filled 2018-06-05: qty 1.7

## 2018-06-05 NOTE — Patient Instructions (Signed)
[facility] Discharge Instructions for Patients Receiving Chemotherapy  Today you received the following chemotherapy agents: Nivolumab (Opdivo)  To help prevent nausea and vomiting after your treatment, we encourage you to take your nausea medication as directed.   If you develop nausea and vomiting that is not controlled by your nausea medication, call the clinic.   BELOW ARE SYMPTOMS THAT SHOULD BE REPORTED IMMEDIATELY:  *FEVER GREATER THAN 100.5 F  *CHILLS WITH OR WITHOUT FEVER  NAUSEA AND VOMITING THAT IS NOT CONTROLLED WITH YOUR NAUSEA MEDICATION  *UNUSUAL SHORTNESS OF BREATH  *UNUSUAL BRUISING OR BLEEDING  TENDERNESS IN MOUTH AND THROAT WITH OR WITHOUT PRESENCE OF ULCERS  *URINARY PROBLEMS  *BOWEL PROBLEMS  UNUSUAL RASH Items with * indicate a potential emergency and should be followed up as soon as possible.  Feel free to call the clinic should you have any questions or concerns. The clinic phone number is [phone].  Please show the CHEMO ALERT CARD at check-in to the Emergency Department and triage nurse.  

## 2018-06-05 NOTE — Telephone Encounter (Signed)
Per 02/14 los, appts already scheduled.

## 2018-06-19 ENCOUNTER — Other Ambulatory Visit: Payer: Self-pay | Admitting: Hematology

## 2018-06-19 ENCOUNTER — Inpatient Hospital Stay: Payer: Medicare Other

## 2018-06-19 VITALS — BP 143/80 | HR 74 | Temp 97.8°F | Resp 18

## 2018-06-19 DIAGNOSIS — C7951 Secondary malignant neoplasm of bone: Secondary | ICD-10-CM

## 2018-06-19 DIAGNOSIS — Z7189 Other specified counseling: Secondary | ICD-10-CM

## 2018-06-19 DIAGNOSIS — Z5112 Encounter for antineoplastic immunotherapy: Secondary | ICD-10-CM

## 2018-06-19 DIAGNOSIS — C642 Malignant neoplasm of left kidney, except renal pelvis: Secondary | ICD-10-CM

## 2018-06-19 DIAGNOSIS — E039 Hypothyroidism, unspecified: Secondary | ICD-10-CM

## 2018-06-19 DIAGNOSIS — Z95828 Presence of other vascular implants and grafts: Secondary | ICD-10-CM

## 2018-06-19 LAB — CBC WITH DIFFERENTIAL/PLATELET
Abs Immature Granulocytes: 0.05 10*3/uL (ref 0.00–0.07)
Basophils Absolute: 0.1 10*3/uL (ref 0.0–0.1)
Basophils Relative: 1 %
Eosinophils Absolute: 0.2 10*3/uL (ref 0.0–0.5)
Eosinophils Relative: 3 %
HCT: 35.8 % — ABNORMAL LOW (ref 36.0–46.0)
Hemoglobin: 11.5 g/dL — ABNORMAL LOW (ref 12.0–15.0)
Immature Granulocytes: 1 %
Lymphocytes Relative: 17 %
Lymphs Abs: 1.1 10*3/uL (ref 0.7–4.0)
MCH: 30.1 pg (ref 26.0–34.0)
MCHC: 32.1 g/dL (ref 30.0–36.0)
MCV: 93.7 fL (ref 80.0–100.0)
Monocytes Absolute: 0.8 10*3/uL (ref 0.1–1.0)
Monocytes Relative: 12 %
Neutro Abs: 4.6 10*3/uL (ref 1.7–7.7)
Neutrophils Relative %: 66 %
Platelets: 255 10*3/uL (ref 150–400)
RBC: 3.82 MIL/uL — ABNORMAL LOW (ref 3.87–5.11)
RDW: 13.5 % (ref 11.5–15.5)
WBC: 6.8 10*3/uL (ref 4.0–10.5)
nRBC: 0 % (ref 0.0–0.2)

## 2018-06-19 LAB — TSH: TSH: 4.99 u[IU]/mL — ABNORMAL HIGH (ref 0.308–3.960)

## 2018-06-19 LAB — CMP (CANCER CENTER ONLY)
ALT: 95 U/L — ABNORMAL HIGH (ref 0–44)
AST: 48 U/L — ABNORMAL HIGH (ref 15–41)
Albumin: 3.9 g/dL (ref 3.5–5.0)
Alkaline Phosphatase: 74 U/L (ref 38–126)
Anion gap: 9 (ref 5–15)
BUN: 28 mg/dL — ABNORMAL HIGH (ref 8–23)
CO2: 23 mmol/L (ref 22–32)
Calcium: 9.7 mg/dL (ref 8.9–10.3)
Chloride: 103 mmol/L (ref 98–111)
Creatinine: 1.79 mg/dL — ABNORMAL HIGH (ref 0.44–1.00)
GFR, Est AFR Am: 31 mL/min — ABNORMAL LOW (ref >60)
GFR, Estimated: 27 mL/min — ABNORMAL LOW (ref >60)
Glucose, Bld: 166 mg/dL — ABNORMAL HIGH (ref 70–99)
Potassium: 4.6 mmol/L (ref 3.5–5.1)
Sodium: 135 mmol/L (ref 135–145)
Total Bilirubin: 0.5 mg/dL (ref 0.3–1.2)
Total Protein: 6.9 g/dL (ref 6.5–8.1)

## 2018-06-19 MED ORDER — SODIUM CHLORIDE 0.9% FLUSH
10.0000 mL | Freq: Once | INTRAVENOUS | Status: AC
  Administered 2018-06-19: 10 mL
  Filled 2018-06-19: qty 10

## 2018-06-19 MED ORDER — SODIUM CHLORIDE 0.9% FLUSH
10.0000 mL | INTRAVENOUS | Status: DC | PRN
  Administered 2018-06-19: 10 mL
  Filled 2018-06-19: qty 10

## 2018-06-19 MED ORDER — SODIUM CHLORIDE 0.9 % IV SOLN
240.0000 mg | Freq: Once | INTRAVENOUS | Status: AC
  Administered 2018-06-19: 240 mg via INTRAVENOUS
  Filled 2018-06-19: qty 24

## 2018-06-19 MED ORDER — HEPARIN SOD (PORK) LOCK FLUSH 100 UNIT/ML IV SOLN
500.0000 [IU] | Freq: Once | INTRAVENOUS | Status: AC | PRN
  Administered 2018-06-19: 500 [IU]
  Filled 2018-06-19: qty 5

## 2018-06-19 MED ORDER — SODIUM CHLORIDE 0.9 % IV SOLN
Freq: Once | INTRAVENOUS | Status: AC
  Administered 2018-06-19: 10:00:00 via INTRAVENOUS
  Filled 2018-06-19: qty 250

## 2018-06-19 NOTE — Patient Instructions (Signed)
[facility] Discharge Instructions for Patients Receiving Chemotherapy  Today you received the following chemotherapy agents Opdivo  To help prevent nausea and vomiting after your treatment, we encourage you to take your nausea medication as directed   If you develop nausea and vomiting that is not controlled by your nausea medication, call the clinic.   BELOW ARE SYMPTOMS THAT SHOULD BE REPORTED IMMEDIATELY:  *FEVER GREATER THAN 100.5 F  *CHILLS WITH OR WITHOUT FEVER  NAUSEA AND VOMITING THAT IS NOT CONTROLLED WITH YOUR NAUSEA MEDICATION  *UNUSUAL SHORTNESS OF BREATH  *UNUSUAL BRUISING OR BLEEDING  TENDERNESS IN MOUTH AND THROAT WITH OR WITHOUT PRESENCE OF ULCERS  *URINARY PROBLEMS  *BOWEL PROBLEMS  UNUSUAL RASH Items with * indicate a potential emergency and should be followed up as soon as possible.  Feel free to call the clinic should you have any questions or concerns. The clinic phone number is [phone].  Please show the CHEMO ALERT CARD at check-in to the Emergency Department and triage nurse.   

## 2018-06-19 NOTE — Progress Notes (Signed)
Per [name] - ok to treat today with current labs

## 2018-06-22 ENCOUNTER — Other Ambulatory Visit: Payer: Self-pay | Admitting: Hematology

## 2018-07-03 ENCOUNTER — Inpatient Hospital Stay: Payer: Medicare Other | Attending: Hematology | Admitting: Hematology

## 2018-07-03 ENCOUNTER — Telehealth: Payer: Self-pay | Admitting: Hematology

## 2018-07-03 ENCOUNTER — Inpatient Hospital Stay: Payer: Medicare Other

## 2018-07-03 ENCOUNTER — Other Ambulatory Visit: Payer: Self-pay

## 2018-07-03 VITALS — BP 141/76 | HR 73 | Temp 98.4°F | Resp 17 | Ht 59.0 in | Wt 132.6 lb

## 2018-07-03 DIAGNOSIS — I2510 Atherosclerotic heart disease of native coronary artery without angina pectoris: Secondary | ICD-10-CM | POA: Diagnosis not present

## 2018-07-03 DIAGNOSIS — E440 Moderate protein-calorie malnutrition: Secondary | ICD-10-CM | POA: Insufficient documentation

## 2018-07-03 DIAGNOSIS — I340 Nonrheumatic mitral (valve) insufficiency: Secondary | ICD-10-CM | POA: Insufficient documentation

## 2018-07-03 DIAGNOSIS — E785 Hyperlipidemia, unspecified: Secondary | ICD-10-CM | POA: Diagnosis not present

## 2018-07-03 DIAGNOSIS — I129 Hypertensive chronic kidney disease with stage 1 through stage 4 chronic kidney disease, or unspecified chronic kidney disease: Secondary | ICD-10-CM | POA: Diagnosis not present

## 2018-07-03 DIAGNOSIS — E039 Hypothyroidism, unspecified: Secondary | ICD-10-CM | POA: Insufficient documentation

## 2018-07-03 DIAGNOSIS — M461 Sacroiliitis, not elsewhere classified: Secondary | ICD-10-CM

## 2018-07-03 DIAGNOSIS — E079 Disorder of thyroid, unspecified: Secondary | ICD-10-CM | POA: Diagnosis not present

## 2018-07-03 DIAGNOSIS — N189 Chronic kidney disease, unspecified: Secondary | ICD-10-CM | POA: Diagnosis not present

## 2018-07-03 DIAGNOSIS — C7801 Secondary malignant neoplasm of right lung: Secondary | ICD-10-CM | POA: Diagnosis not present

## 2018-07-03 DIAGNOSIS — K8020 Calculus of gallbladder without cholecystitis without obstruction: Secondary | ICD-10-CM | POA: Diagnosis not present

## 2018-07-03 DIAGNOSIS — I700 Atherosclerosis of aorta: Secondary | ICD-10-CM | POA: Insufficient documentation

## 2018-07-03 DIAGNOSIS — I252 Old myocardial infarction: Secondary | ICD-10-CM | POA: Insufficient documentation

## 2018-07-03 DIAGNOSIS — Z95828 Presence of other vascular implants and grafts: Secondary | ICD-10-CM

## 2018-07-03 DIAGNOSIS — Z87891 Personal history of nicotine dependence: Secondary | ICD-10-CM | POA: Diagnosis not present

## 2018-07-03 DIAGNOSIS — Z79899 Other long term (current) drug therapy: Secondary | ICD-10-CM | POA: Insufficient documentation

## 2018-07-03 DIAGNOSIS — R918 Other nonspecific abnormal finding of lung field: Secondary | ICD-10-CM | POA: Diagnosis not present

## 2018-07-03 DIAGNOSIS — C642 Malignant neoplasm of left kidney, except renal pelvis: Secondary | ICD-10-CM

## 2018-07-03 DIAGNOSIS — C7951 Secondary malignant neoplasm of bone: Secondary | ICD-10-CM

## 2018-07-03 DIAGNOSIS — K449 Diaphragmatic hernia without obstruction or gangrene: Secondary | ICD-10-CM | POA: Insufficient documentation

## 2018-07-03 DIAGNOSIS — K769 Liver disease, unspecified: Secondary | ICD-10-CM | POA: Insufficient documentation

## 2018-07-03 DIAGNOSIS — E1122 Type 2 diabetes mellitus with diabetic chronic kidney disease: Secondary | ICD-10-CM | POA: Diagnosis not present

## 2018-07-03 DIAGNOSIS — E538 Deficiency of other specified B group vitamins: Secondary | ICD-10-CM | POA: Diagnosis not present

## 2018-07-03 DIAGNOSIS — E871 Hypo-osmolality and hyponatremia: Secondary | ICD-10-CM | POA: Insufficient documentation

## 2018-07-03 DIAGNOSIS — M1990 Unspecified osteoarthritis, unspecified site: Secondary | ICD-10-CM | POA: Diagnosis not present

## 2018-07-03 DIAGNOSIS — Z700 Counseling related to sexual attitude: Secondary | ICD-10-CM

## 2018-07-03 DIAGNOSIS — R7989 Other specified abnormal findings of blood chemistry: Secondary | ICD-10-CM

## 2018-07-03 DIAGNOSIS — R945 Abnormal results of liver function studies: Secondary | ICD-10-CM

## 2018-07-03 DIAGNOSIS — E2740 Unspecified adrenocortical insufficiency: Secondary | ICD-10-CM | POA: Diagnosis not present

## 2018-07-03 DIAGNOSIS — K649 Unspecified hemorrhoids: Secondary | ICD-10-CM | POA: Diagnosis not present

## 2018-07-03 LAB — CBC WITH DIFFERENTIAL/PLATELET
Abs Immature Granulocytes: 0.08 10*3/uL — ABNORMAL HIGH (ref 0.00–0.07)
Basophils Absolute: 0.1 10*3/uL (ref 0.0–0.1)
Basophils Relative: 1 %
Eosinophils Absolute: 0.2 10*3/uL (ref 0.0–0.5)
Eosinophils Relative: 2 %
HCT: 36.6 % (ref 36.0–46.0)
Hemoglobin: 11.7 g/dL — ABNORMAL LOW (ref 12.0–15.0)
Immature Granulocytes: 1 %
Lymphocytes Relative: 15 %
Lymphs Abs: 1.2 10*3/uL (ref 0.7–4.0)
MCH: 30.2 pg (ref 26.0–34.0)
MCHC: 32 g/dL (ref 30.0–36.0)
MCV: 94.6 fL (ref 80.0–100.0)
Monocytes Absolute: 1 10*3/uL (ref 0.1–1.0)
Monocytes Relative: 12 %
Neutro Abs: 5.4 10*3/uL (ref 1.7–7.7)
Neutrophils Relative %: 69 %
Platelets: 250 10*3/uL (ref 150–400)
RBC: 3.87 MIL/uL (ref 3.87–5.11)
RDW: 13.3 % (ref 11.5–15.5)
WBC: 7.9 10*3/uL (ref 4.0–10.5)
nRBC: 0 % (ref 0.0–0.2)

## 2018-07-03 LAB — CMP (CANCER CENTER ONLY)
ALT: 178 U/L — ABNORMAL HIGH (ref 0–44)
AST: 68 U/L — ABNORMAL HIGH (ref 15–41)
Albumin: 3.7 g/dL (ref 3.5–5.0)
Alkaline Phosphatase: 105 U/L (ref 38–126)
Anion gap: 12 (ref 5–15)
BUN: 27 mg/dL — ABNORMAL HIGH (ref 8–23)
CO2: 22 mmol/L (ref 22–32)
Calcium: 10.5 mg/dL — ABNORMAL HIGH (ref 8.9–10.3)
Chloride: 100 mmol/L (ref 98–111)
Creatinine: 1.71 mg/dL — ABNORMAL HIGH (ref 0.44–1.00)
GFR, Est AFR Am: 33 mL/min — ABNORMAL LOW (ref >60)
GFR, Estimated: 29 mL/min — ABNORMAL LOW (ref >60)
Glucose, Bld: 158 mg/dL — ABNORMAL HIGH (ref 70–99)
Potassium: 4.7 mmol/L (ref 3.5–5.1)
Sodium: 134 mmol/L — ABNORMAL LOW (ref 135–145)
Total Bilirubin: 0.7 mg/dL (ref 0.3–1.2)
Total Protein: 6.8 g/dL (ref 6.5–8.1)

## 2018-07-03 MED ORDER — HEPARIN SOD (PORK) LOCK FLUSH 100 UNIT/ML IV SOLN
500.0000 [IU] | Freq: Once | INTRAVENOUS | Status: AC
  Administered 2018-07-03: 500 [IU]
  Filled 2018-07-03: qty 5

## 2018-07-03 MED ORDER — SODIUM CHLORIDE 0.9% FLUSH
10.0000 mL | Freq: Once | INTRAVENOUS | Status: AC
  Administered 2018-07-03: 10 mL
  Filled 2018-07-03: qty 10

## 2018-07-03 NOTE — Progress Notes (Signed)
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 The patient's last visit with [name] was on 06/05/18. She is accompanied today by her husband. The pt reports that she is doing well overall.   The pt reports that she has been going to to PT. She notes that her gallbladder has given her "twinges about every day this week." She notes that she is not having overt abdominal pain as such.  The pt notes that she has had stable energy levels and has continued eating okay. She denies developing any other new concerns.  Lab results today (07/03/18) of CBC w/diff and CMP is as follows: all values are WNL except for HGB at 11.7, Abs immature granulocytes at 0.08k, Sodium at 134, Glucose at 158, BUN at 27, Creatinine at 1.71, Calcium at 10.5, AST at 68, ALT at 178, GFR at 29.  On review of systems, pt reports gallbladder twinges, stable energy levels, staying hydrated, and denies overt abdominal pain, and any other symptoms.   MEDICAL HISTORY:    Hypertension Dyslipidemia Osteoarthritis Ex-smoker Coronary artery disease Thyroid disorder-was apparently on levothyroxine 25 g daily which has subsequently been discontinued . 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 Earley Brooke Colonoscopy 03/2016 Dr. Earley Brooke  SOCIAL HISTORY: Social History   Socioeconomic History  . Marital status: Married     Spouse name: Not on file  . Number of children: Not on file  . Years of education: Not on file  . Highest education level: Not on file  Occupational History  . Not on file  Social Needs  . Financial resource strain: Not on file  . Food insecurity:    Worry: Not on file    Inability: Not on file  . Transportation needs:    Medical: Not on file    Non-medical: Not on file  Tobacco Use  . Smoking status: Former Smoker    Years: 8.00    Last attempt to quit: 06/18/1964    Years since quitting: 54.0  . Smokeless tobacco: Never Used  Substance and Sexual Activity  . Alcohol use: No  . Drug use: No  . Sexual activity: Not on file  Lifestyle  . Physical activity:    Days per week: Not on file    Minutes per session: Not on file  . Stress: Not on file  Relationships  . Social connections:    Talks on phone: Not on file    Gets together: Not on file    Attends religious service: Not on file    Active member of club or organization: Not on file    Attends meetings of clubs or organizations: Not on file    Relationship status: Not on file  . Intimate partner violence:    Fear of current or ex partner: Not on file    Emotionally abused: Not on file    Physically abused: Not on file  Forced sexual activity: Not on file  Other Topics Concern  . Not on file  Social History Narrative  . Not on file   Patient lives in [place] Works as a Solicitor part-time Ex-smoker quit long time ago In 1961 . Smoked 2 packs per day for 8 years prior to that .  or a lot of stress since her daughter is also getting treated for stage IV uterine cancer.  FAMILY HISTORY:  Mother deceased Father with asthma and emphysema died at 56 years with the MI. Brother deceased of heart disease   ALLERGIES:  is allergic to adhesive [tape]; nsaids; and statins.  MEDICATIONS:  Current Outpatient Medications  Medication Sig Dispense Refill  . acetaminophen (TYLENOL) 500 MG tablet Take  1,000 mg by mouth 2 (two) times daily.     Marland Kitchen amLODipine (NORVASC) 5 MG tablet Take 5 mg by mouth daily.  6  . atorvastatin (LIPITOR) 40 MG tablet Take 40 mg by mouth daily at 6 PM.     . Calcium 600-200 MG-UNIT tablet Take 1 tablet by mouth daily.    . carvedilol (COREG) 6.25 MG tablet Take 6.25 mg by mouth 2 (two) times daily.  6  . clobetasol ointment (TEMOVATE) 7.56 % Apply 1 application topically 2 (two) times daily.    Marland Kitchen denosumab (XGEVA) 120 MG/1.7ML SOLN injection Inject 120 mg into the skin every 6 (six) weeks.    . ferrous sulfate 324 (65 Fe) MG TBEC Take 1 tablet by mouth daily.    . hydrocortisone (CORTEF) 10 MG tablet Take 10 mg by mouth 2 (two) times daily.     Marland Kitchen levothyroxine (SYNTHROID, LEVOTHROID) 50 MCG tablet Take 50 mcg by mouth daily before breakfast.     . lidocaine-prilocaine (EMLA) cream Apply to affected area once 30 g 3  . Nivolumab (OPDIVO IV) Inject 1 Dose into the vein every 14 (fourteen) days.    . pantoprazole (PROTONIX) 20 MG tablet Take 20 mg by mouth daily.    . traMADol (ULTRAM) 50 MG tablet TAKE 1/2 TO 1 (ONE-HALF TO ONE) TABLET BY MOUTH EVERY 6 HOURS AS NEEDED 60 tablet 0   No current facility-administered medications for this visit.     REVIEW OF SYSTEMS:    A 10+ POINT REVIEW OF SYSTEMS WAS OBTAINED including neurology, dermatology, psychiatry, cardiac, respiratory, lymph, extremities, GI, GU, Musculoskeletal, constitutional, breasts, reproductive, HEENT.  All pertinent positives are noted in the HPI.  All others are negative.   PHYSICAL EXAMINATION:  ECOG PERFORMANCE STATUS: 1 - Symptomatic but completely ambulatory   Vitals:   07/03/18 1013  BP: (!) 141/76  Pulse: 73  Resp: 17  Temp: 98.4 F (36.9 C)  SpO2: 99%   Filed Weights   07/03/18 1013  Weight: 132 lb 9.6 oz (60.1 kg)   .Body mass index is 26.78 kg/m.   GENERAL:alert, in no acute distress and comfortable SKIN: no acute rashes, no significant lesions EYES: conjunctiva are pink  and non-injected, sclera anicteric OROPHARYNX: MMM, no exudates, no oropharyngeal erythema or ulceration NECK: supple, no JVD LYMPH:  no palpable lymphadenopathy in the cervical, axillary or inguinal regions LUNGS: clear to auscultation b/l with normal respiratory effort HEART: regular rate & rhythm ABDOMEN:  normoactive bowel sounds , non tender, not distended. No palpable hepatosplenomegaly.  Extremity: no pedal edema PSYCH: alert & oriented x 3 with fluent speech NEURO: no focal motor/sensory deficits    LABORATORY DATA:  I have reviewed the data as listed  .  CBC Latest Ref Rng & Units 07/03/2018 06/19/2018 06/05/2018  WBC 4.0 - 10.5 K/uL 7.9 6.8 6.7  Hemoglobin 12.0 - 15.0 g/dL 11.7(L) 11.5(L) 11.9(L)  Hematocrit 36.0 - 46.0 % 36.6 35.8(L) 37.3  Platelets 150 - 400 K/uL 250 255 283    CBC    Component Value Date/Time   WBC 7.9 07/03/2018 0928   RBC 3.87 07/03/2018 0928   HGB 11.7 (L) 07/03/2018 0928   HGB 11.2 (L) 02/27/2018 0835   HGB 11.2 (L) 04/04/2017 0830   HCT 36.6 07/03/2018 0928   HCT 35.2 04/04/2017 0830   PLT 250 07/03/2018 0928   PLT 219 02/27/2018 0835   PLT 250 04/04/2017 0830   MCV 94.6 07/03/2018 0928   MCV 107.6 (H) 04/04/2017 0830   MCH 30.2 07/03/2018 0928   MCHC 32.0 07/03/2018 0928   RDW 13.3 07/03/2018 0928   RDW 14.0 04/04/2017 0830   LYMPHSABS 1.2 07/03/2018 0928   LYMPHSABS 1.6 04/04/2017 0830   MONOABS 1.0 07/03/2018 0928   MONOABS 0.4 04/04/2017 0830   EOSABS 0.2 07/03/2018 0928   EOSABS 0.1 04/04/2017 0830   BASOSABS 0.1 07/03/2018 0928   BASOSABS 0.0 04/04/2017 0830     . CMP Latest Ref Rng & Units 07/03/2018 06/19/2018 06/05/2018  Glucose 70 - 99 mg/dL 158(H) 166(H) 148(H)  BUN 8 - 23 mg/dL 27(H) 28(H) 23  Creatinine 0.44 - 1.00 mg/dL 1.71(H) 1.79(H) 1.73(H)  Sodium 135 - 145 mmol/L 134(L) 135 137  Potassium 3.5 - 5.1 mmol/L 4.7 4.6 4.9  Chloride 98 - 111 mmol/L 100 103 106  CO2 22 - 32 mmol/L 22 23 21(L)  Calcium 8.9 - 10.3  mg/dL 10.5(H) 9.7 9.8  Total Protein 6.5 - 8.1 g/dL 6.8 6.9 6.9  Total Bilirubin 0.3 - 1.2 mg/dL 0.7 0.5 0.4  Alkaline Phos 38 - 126 U/L 105 74 63  AST 15 - 41 U/L 68(H) 48(H) 23  ALT 0 - 44 U/L 178(H) 95(H) 34   B12 level 299 (OSH)-->455  . Lab Results  Component Value Date   LDH 136 03/13/2018         RADIOGRAPHIC STUDIES: I have personally reviewed the radiological images as listed and agreed with the findings in the report. Nm Pet Image Restag (ps) Skull Base To Thigh  Result Date: 01/03/2017 IMPRESSION: 1. There is a new lesion in the hepatic dome which is FDG avid and low in attenuation on CT imaging consistent with metastatic disease. Another region of uptake in the left hepatic lobe posteriorly demonstrates no CT correlate. A second subtle metastasis is not excluded on today's study. An MRI could better assess for other hepatic metastases if clinically warranted. 2. New metastatic lesion in the left side of T8. 3. New pulmonary nodule in the right upper lobe. This is too small to characterize but suspicious. Recommend attention on follow-up. No FDG avid nodules or other enlarging nodules. 4. The uptake at the previous left renal artery has almost resolved in the interval and is favored to be post therapeutic. Recommend attention on follow-up. 5. The metastasis in the posterior right hilum seen previously has resolved. Electronically Signed   By: Dorise Bullion III M.D   On: 01/03/2017 11:16   .No results found.  ASSESSMENT & PLAN:    78 y.o. 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 PET/CT 06/19/2017 -- consistent with partial metabolic response to  treatment.  Rt adrenal gland bx - showed clear cell RCC  S/p CYtoreductive left radical nephrectomy and left adrenal gland resection on 08/01/2016 by Dr Alinda Money.  10/16/17 PET/CT revealed Continued  improvement, with the T7 metastatic lesion no longer significantly hypermetabolic. The previous right lower lobe pulmonary nodule is even less apparent, perhaps about 2 mm in diameter today; given that this measured 6 mm on 03/28/2017 this probably represents an effectively treated metastatic lesion. Currently no appreciable hypermetabolic activity is identified to suggest active malignancy. Distended gallbladder with gallbladder wall thickening and gallstones. Correlate clinically in assessing for cholecystitis. Small but abnormal amount of free pelvic fluid, nonspecific. Other imaging findings of potential clinical significance: Chronic ethmoid sinusitis. Aortic Atherosclerosis. Stable 5 mm right middle lobe pulmonary nodule, not hypermetabolic but below sensitive PET-CT size thresholds. Left nephrectomy. Notable pelvic floor laxity with cystocele. Chronic bilateral Sacroiliitis.  04/03/18 PET/CT revealed No evidence for new or progressive hypermetabolic disease on today's study to suggest metastatic progression. 2. Stable appearance of the T7 metastatic lesion without Hypermetabolism. 3. Tiny focus of FDG accumulation identified along the skin of the low right inguinal fold. No associated lesion evident on CT. This may be related to urinary contaminant. 4. Cholelithiasis with similar appearance of diffuse gallbladder wall thickening. 5. Stable 5 mm right middle lobe pulmonary nodule. 6.  Aortic Atherosclerois. 7. Diffuse colonic diverticulosis.   #2 b/l adrenal metastases from [place] s/p left adrenalectomy with adrenal insuff - follows with Dr Buddy Duty.  #3 Small pulmonary lesions -- 10/16/17 PET/CT showed pulmonary less apparent than 03/28/17 PET/CT, decreasing from 84mm to 29mm diameter.   MRI brain shows no evidence of metastatic disease  #4 T7/8 Bone metastases - received Xgeva every 4 weeks from May 2018 to June 2019. 10/16/17 PET/CT revealed improvement with T7 metastatic lesion no longer significantly  hypermetabolic.  -on Marchelle Folks  #5 ?liver mets- rpt PET/CT from 10/16/2017 shows no overt evidence of metastatic disease in the liver.  #6 Grade 1 Nausea - improved and intermittent. Hasnt used her anti-emetic as instructed so some nausea and decreased po intake.  #7 Grade 1 Diarrhea - resolved  #8 Hyponatremia -  resolved with sodium at 137 - likely related to some element of adrenal insufficiency, diarrhea, limited by mouth intake. Primarily solute free fluid intake.   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 Buddy Duty  #14 HTN - ?control. Patient tends to be anxious and has higher blood pressures in the clinic. She can have increased blood pressure from Sutent as well. Plan: -continued close f/u with her PCP /cardiology regarding the many elements necessary for her care that are not directly related to her oncology care. -ACEI held due to AKI and hyperkalemia - following with cardiology to mx this. Has been started on Amlodipine instead.  #15 Grade 1 mucositis- resolved -Advised the patient  to continue use of Magic Mouthwash to aid with nutrition  #16 Newly diagnosed ?CAD - positive cardiac nuclear stress test Following with cardiology and nephrology ? Need for pre-operative cardiac  cath  #17 Cholecystitis - on hold due to cardiac issues noted on pre-operative evaluation.  PLAN:  -Discussed pt labwork today, 07/03/18; blood chemistries show increases in transaminases, kidney function is stable. HGB improved to 11.7 -Discussed that her recently elevated liver functions are suspected to be cholecystitis vs medication effect vs new mets, do not strongly suspect medication effect but will need to hold Nivolumab today -Will order US Abdomen today, and provide referral to a general surgeon -Advised that the pt return to care with her PCP or Dr. [name] to discuss worsening liver functions. -Recommend using Senna or Miralax for constipation -Continue with OTC Lidocaine patch for lower back pain  -Recommend that the pt keep well moisturized, suspecting that patient does have dry skin -Hydroxyzine as well for itching -Did refer the pt to our outpatient Physical Therapy -Continue Xgeva every 4 weeks -Have held Sutent with considerations of CAD and intentions for cholecystectomy -Follow up with OBGYN regarding uterine prolapse and concern for urinary retention, which could explain mild increase in Creatinine -Recommended that the pt continue to eat well, drink at least 48-64 oz of water each day, and walk 20-30 minutes each day.  -Will see the pt back in 2 weeks    [name] abd in 1 week Surgery referral for symptomatic cholelithiasis RTC with Dr Irene Limbo with labs in 2 weeks with next dose of Nivolumab   All of the patients questions were answered with apparent satisfaction. The patient knows to call the clinic with any problems, questions or concerns.  The total time spent in the appt was 30 minutes and more than 50% was on counseling and direct patient cares.   Sullivan Lone MD [place] AAHIVMS [facility] Hematology/Oncology Physician [facility]  (Office):       [phone] (Work cell):  [phone] (Fax):           [phone]  I, Baldwin [place], am acting as a  scribe for Dr. Sullivan Lone.   .I have reviewed the above documentation for accuracy and completeness, and I agree with the above. Brunetta Genera MD

## 2018-07-03 NOTE — Telephone Encounter (Signed)
Gave avs and calendar ° °

## 2018-07-03 NOTE — Addendum Note (Signed)
Addended by: Lenox Ponds E on: 07/03/2018 11:40 AM   Modules accepted: Orders

## 2018-07-10 ENCOUNTER — Ambulatory Visit (HOSPITAL_COMMUNITY)
Admission: RE | Admit: 2018-07-10 | Discharge: 2018-07-10 | Disposition: A | Discharge status: Home / Self Care | Payer: Medicare Other | Source: Ambulatory Visit | Attending: Hematology | Admitting: Hematology

## 2018-07-10 ENCOUNTER — Other Ambulatory Visit: Payer: Self-pay

## 2018-07-10 ENCOUNTER — Telehealth: Payer: Self-pay | Admitting: *Deleted

## 2018-07-10 DIAGNOSIS — R7989 Other specified abnormal findings of blood chemistry: Secondary | ICD-10-CM

## 2018-07-10 DIAGNOSIS — R945 Abnormal results of liver function studies: Secondary | ICD-10-CM | POA: Insufficient documentation

## 2018-07-10 NOTE — Telephone Encounter (Signed)
"  Audrey [name] Ultrasound, call report for [name]'s abdominal US performed this morning.    IMPRESSION: 1. Biliary duct dilatation with 8 mm calculus at the level of the ampulla in the distal common bile duct.  2. Cholelithiasis. No gallbladder wall thickening or pericholecystic fluid.  3. Appearance of the liver raises concern for underlying hepatic cirrhosis. No focal liver lesions are demonstrable."

## 2018-07-16 ENCOUNTER — Telehealth: Payer: Self-pay | Admitting: *Deleted

## 2018-07-16 NOTE — Telephone Encounter (Signed)
Per [name]- inform patient that appt on 3/27 @ 10am will be changed to a phone visit to discuss test results. He is cancelling her labs/port flush/infusion scheduled on 3/27 due to need to discuss test results first. Patient verbalized understanding and time of call. Schedule message sent to change type of visit.

## 2018-07-16 NOTE — Progress Notes (Signed)
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 The patient's last visit with [name] was on 07/03/18. The pt reports that she is doing well overall.  The pt reports that she is having "constant" pains in the center of her abdomen, radiating into her shoulder blades. She has taken 2 tramadol today with relief from her pain. The pt notes that she will be seeing her cardiologist on 08/14/18, and did not see general surgery in the interim. The pt notes that she is still able to eat well and is staying hydrated. The pt denies any fevers or chills.  Of note since the patient's last visit, pt has had US Abdomen completed on 07/10/18 with results revealing Biliary duct dilatation with 8 mm calculus at the level of the ampulla in the distal common bile duct. 2. Cholelithiasis. No gallbladder wall thickening or pericholecystic fluid. 3. Appearance of the liver raises concern for underlying hepatic cirrhosis. No focal liver lesions are demonstrable. 4. Left kidney absent. 5.  Small cysts in right kidney. 6. Aortic Atherosclerosis.  On review of systems, pt reports eating well, staying hydrated, central abdominal pain,  and denies fevers, chills, and any other symptoms.  MEDICAL HISTORY:    Hypertension Dyslipidemia Osteoarthritis Ex-smoker Coronary artery disease Thyroid disorder-was apparently on levothyroxine 25 g daily which has subsequently been discontinued .  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 Earley Brooke Colonoscopy 03/2016 Dr. Earley Brooke  SOCIAL HISTORY: Social History   Socioeconomic History  . Marital status: Married    Spouse name: Not on file  . Number of children: Not on file  . Years of education: Not on file  . Highest education level: Not on file  Occupational History  . Not on file  Social Needs  . Financial resource strain: Not on file  . Food insecurity:    Worry: Not on file    Inability: Not on file  . Transportation needs:    Medical: Not on file    Non-medical: Not on file  Tobacco Use  . Smoking status: Former Smoker    Years: 8.00    Last attempt to quit: 06/18/1964    Years since quitting: 54.1  . Smokeless tobacco: Never Used  Substance and Sexual Activity  . Alcohol use: No  . Drug use: No  . Sexual activity: Not on file  Lifestyle  . Physical activity:    Days per week: Not on file    Minutes per session: Not on file  . Stress: Not on file  Relationships  . Social connections:    Talks on phone: Not on file    Gets together: Not on file    Attends religious service: Not on file    Active member of club or organization: Not on file    Attends meetings of clubs or organizations: Not on file  Relationship status: Not on file  . Intimate partner violence:    Fear of current or ex partner: Not on file    Emotionally abused: Not on file    Physically abused: Not on file    Forced sexual activity: Not on file  Other Topics Concern  . Not on file  Social History Narrative  . Not on file   Patient lives in [place] Works as a Solicitor part-time Ex-smoker quit long time ago In 1961 . Smoked 2 packs per day for 8 years prior to that .  or a lot of stress since her daughter is also getting treated for stage IV uterine cancer.   FAMILY HISTORY:  Mother deceased Father with asthma and emphysema died at 65 years with the MI. Brother deceased of heart disease   ALLERGIES:  is allergic to adhesive [tape]; nsaids; and statins.  MEDICATIONS:  Current Outpatient Medications  Medication Sig Dispense Refill  . acetaminophen (TYLENOL) 500 MG tablet Take 1,000 mg by mouth 2 (two) times daily.     Marland Kitchen amLODipine (NORVASC) 5 MG tablet Take 5 mg by mouth daily.  6  . atorvastatin (LIPITOR) 40 MG tablet Take 40 mg by mouth daily at 6 PM.     . Calcium 600-200 MG-UNIT tablet Take 1 tablet by mouth daily.    . carvedilol (COREG) 6.25 MG tablet Take 6.25 mg by mouth 2 (two) times daily.  6  . clobetasol ointment (TEMOVATE) 0.09 % Apply 1 application topically 2 (two) times daily.    Marland Kitchen denosumab (XGEVA) 120 MG/1.7ML SOLN injection Inject 120 mg into the skin every 6 (six) weeks.    . ferrous sulfate 324 (65 Fe) MG TBEC Take 1 tablet by mouth daily.    . hydrocortisone (CORTEF) 10 MG tablet Take 10 mg by mouth 2 (two) times daily.     Marland Kitchen levothyroxine (SYNTHROID, LEVOTHROID) 50 MCG tablet Take 50 mcg by mouth daily before breakfast.     . lidocaine-prilocaine (EMLA) cream Apply to affected area once 30 g 3  . Nivolumab (OPDIVO IV) Inject 1 Dose into the vein every 14 (fourteen) days.    . pantoprazole (PROTONIX) 20 MG tablet Take 20 mg by mouth daily.    . traMADol (ULTRAM) 50 MG tablet TAKE 1/2 TO 1 (ONE-HALF TO ONE) TABLET BY MOUTH EVERY 6 HOURS AS NEEDED 60 tablet 0   No current facility-administered medications for this visit.     REVIEW OF SYSTEMS:    A 10+ POINT REVIEW OF SYSTEMS WAS OBTAINED including neurology, dermatology, psychiatry, cardiac, respiratory, lymph, extremities, GI, GU, Musculoskeletal, constitutional, breasts, reproductive, HEENT.  All pertinent positives are noted in the HPI.  All others are negative.   PHYSICAL EXAMINATION:  ECOG PERFORMANCE STATUS: 1 - Symptomatic but completely ambulatory   There  were no vitals filed for this visit. There were no vitals filed for this visit. .There is no height or weight on file to calculate BMI.   No Physical Exam, per Phone Visit.  LABORATORY DATA:  I have reviewed the data as listed  . CBC Latest Ref Rng & Units 07/03/2018 06/19/2018 06/05/2018  WBC 4.0 - 10.5 K/uL 7.9 6.8 6.7  Hemoglobin 12.0 - 15.0 g/dL 11.7(L) 11.5(L) 11.9(L)  Hematocrit 36.0 - 46.0 % 36.6 35.8(L) 37.3  Platelets 150 - 400 K/uL 250 255 283    CBC    Component Value Date/Time   WBC 7.9 07/03/2018 0928   RBC 3.87 07/03/2018 0928  HGB 11.7 (L) 07/03/2018 0928   HGB 11.2 (L) 02/27/2018 0835   HGB 11.2 (L) 04/04/2017 0830   HCT 36.6 07/03/2018 0928   HCT 35.2 04/04/2017 0830   PLT 250 07/03/2018 0928   PLT 219 02/27/2018 0835   PLT 250 04/04/2017 0830   MCV 94.6 07/03/2018 0928   MCV 107.6 (H) 04/04/2017 0830   MCH 30.2 07/03/2018 0928   MCHC 32.0 07/03/2018 0928   RDW 13.3 07/03/2018 0928   RDW 14.0 04/04/2017 0830   LYMPHSABS 1.2 07/03/2018 0928   LYMPHSABS 1.6 04/04/2017 0830   MONOABS 1.0 07/03/2018 0928   MONOABS 0.4 04/04/2017 0830   EOSABS 0.2 07/03/2018 0928   EOSABS 0.1 04/04/2017 0830   BASOSABS 0.1 07/03/2018 0928   BASOSABS 0.0 04/04/2017 0830     . CMP Latest Ref Rng & Units 07/03/2018 06/19/2018 06/05/2018  Glucose 70 - 99 mg/dL 158(H) 166(H) 148(H)  BUN 8 - 23 mg/dL 27(H) 28(H) 23  Creatinine 0.44 - 1.00 mg/dL 1.71(H) 1.79(H) 1.73(H)  Sodium 135 - 145 mmol/L 134(L) 135 137  Potassium 3.5 - 5.1 mmol/L 4.7 4.6 4.9  Chloride 98 - 111 mmol/L 100 103 106  CO2 22 - 32 mmol/L 22 23 21(L)  Calcium 8.9 - 10.3 mg/dL 10.5(H) 9.7 9.8  Total Protein 6.5 - 8.1 g/dL 6.8 6.9 6.9  Total Bilirubin 0.3 - 1.2 mg/dL 0.7 0.5 0.4  Alkaline Phos 38 - 126 U/L 105 74 63  AST 15 - 41 U/L 68(H) 48(H) 23  ALT 0 - 44 U/L 178(H) 95(H) 34   B12 level 299 (OSH)-->455  . Lab Results  Component Value Date   LDH 136 03/13/2018         RADIOGRAPHIC STUDIES:  I have personally reviewed the radiological images as listed and agreed with the findings in the report. Nm Pet Image Restag (ps) Skull Base To Thigh  Result Date: 01/03/2017 IMPRESSION: 1. There is a new lesion in the hepatic dome which is FDG avid and low in attenuation on CT imaging consistent with metastatic disease. Another region of uptake in the left hepatic lobe posteriorly demonstrates no CT correlate. A second subtle metastasis is not excluded on today's study. An MRI could better assess for other hepatic metastases if clinically warranted. 2. New metastatic lesion in the left side of T8. 3. New pulmonary nodule in the right upper lobe. This is too small to characterize but suspicious. Recommend attention on follow-up. No FDG avid nodules or other enlarging nodules. 4. The uptake at the previous left renal artery has almost resolved in the interval and is favored to be post therapeutic. Recommend attention on follow-up. 5. The metastasis in the posterior right hilum seen previously has resolved. Electronically Signed   By: Dorise Bullion III M.D   On: 01/03/2017 11:16   .US Abdomen Complete  Result Date: 07/10/2018 CLINICAL DATA:  Abnormal liver enzymes. History of renal cell carcinoma EXAM: ABDOMEN ULTRASOUND COMPLETE COMPARISON:  PET-CT April 03, 2018 FINDINGS: Gallbladder: Within the gallbladder, there are echogenic foci which move and shadow consistent with cholelithiasis. Largest individual gallstone measures 1.7 cm in length. There is no appreciable gallbladder wall thickening or pericholecystic fluid. No sonographic Murphy sign noted by sonographer. Common bile duct: Diameter: There is dilatation of the common bile duct to 12 mm. A calculus is seen in the distal most aspect of the common bile duct near the ampulla measuring 8 mm. There is slight intrahepatic biliary duct dilatation as well. Liver: No focal  lesion identified. Liver has a somewhat nodular contour concerning for underlying  cirrhosis. The echotexture of the liver is rather coarse. Portal vein is patent on color Doppler imaging with normal direction of blood flow towards the liver. IVC: No abnormality visualized. Pancreas: No pancreatic mass or inflammatory focus. Spleen: Size and appearance within normal limits. Right Kidney: Length: 11.5 cm. Echogenicity within normal limits. There is a cyst arising from the upper pole of the right kidney measuring 0.9 x 0.7 x 0.5 cm. There are several small parapelvic cysts, largest measuring 1.3 x 1.0 cm. No perinephric fluid or hydronephrosis visualized. Left Kidney: Surgically absent. No lesion is evident in the renal fossa region on the left. Abdominal aorta: No aneurysm visualized. There is extensive aortic atherosclerosis. Other findings: No demonstrable ascites. IMPRESSION: 1. Biliary duct dilatation with 8 mm calculus at the level of the ampulla in the distal common bile duct. 2. Cholelithiasis. No gallbladder wall thickening or pericholecystic fluid. 3. Appearance of the liver raises concern for underlying hepatic cirrhosis. No focal liver lesions are demonstrable. 4.  Left kidney absent. 5.  Small cysts in right kidney. 6.  Aortic Atherosclerosis (ICD10-I70.0). These results will be called to the ordering clinician or representative by the Radiologist Assistant, and communication documented in the PACS or zVision Dashboard. Electronically Signed   By: Lowella Grip III M.D.   On: 07/10/2018 09:34    ASSESSMENT & PLAN:    78 y.o. 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 PET/CT 06/19/2017 -- consistent with partial metabolic response to treatment.  Rt adrenal gland bx - showed clear cell RCC  S/p CYtoreductive left radical nephrectomy and left adrenal gland resection on 08/01/2016 by Dr Alinda Money.  10/16/17 PET/CT revealed Continued improvement, with  the T7 metastatic lesion no longer significantly hypermetabolic. The previous right lower lobe pulmonary nodule is even less apparent, perhaps about 2 mm in diameter today; given that this measured 6 mm on 03/28/2017 this probably represents an effectively treated metastatic lesion. Currently no appreciable hypermetabolic activity is identified to suggest active malignancy. Distended gallbladder with gallbladder wall thickening and gallstones. Correlate clinically in assessing for cholecystitis. Small but abnormal amount of free pelvic fluid, nonspecific. Other imaging findings of potential clinical significance: Chronic ethmoid sinusitis. Aortic Atherosclerosis. Stable 5 mm right middle lobe pulmonary nodule, not hypermetabolic but below sensitive PET-CT size thresholds. Left nephrectomy. Notable pelvic floor laxity with cystocele. Chronic bilateral Sacroiliitis.  04/03/18 PET/CT revealed No evidence for new or progressive hypermetabolic disease on today's study to suggest metastatic progression. 2. Stable appearance of the T7 metastatic lesion without Hypermetabolism. 3. Tiny focus of FDG accumulation identified along the skin of the low right inguinal fold. No associated lesion evident on CT. This may be related to urinary contaminant. 4. Cholelithiasis with similar appearance of diffuse gallbladder wall thickening. 5. Stable 5 mm right middle lobe pulmonary nodule. 6.  Aortic Atherosclerois. 7. Diffuse colonic diverticulosis.   #2 b/l adrenal metastases from [place] s/p left adrenalectomy with adrenal insuff - follows with Dr Buddy Duty.  #3 Small pulmonary lesions -- 10/16/17 PET/CT showed pulmonary less apparent than 03/28/17 PET/CT, decreasing from 53mm to 3mm diameter.   MRI brain shows no evidence of metastatic disease  #4 T7/8 Bone metastases - received Xgeva every 4 weeks from May 2018 to June 2019. 10/16/17 PET/CT revealed improvement with T7 metastatic lesion no longer significantly hypermetabolic.  -on  Delton See q4weeks  #5 ?liver mets- rpt PET/CT from 10/16/2017 shows no overt evidence of metastatic disease in the liver.  #6 Grade 1 Nausea - improved and intermittent. Hasnt used her anti-emetic as instructed so some nausea and decreased po intake.  #7 Grade 1 Diarrhea - resolved  #8 Hyponatremia -  resolved with sodium at 137 - likely related to some element of adrenal insufficiency, diarrhea, limited by mouth intake. Primarily solute free fluid intake.   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 Buddy Duty  #14 HTN - ?control. Patient tends to be anxious and has higher blood pressures in the clinic. She can have increased blood pressure from Sutent as well. Plan: -continued close f/u with her PCP /cardiology regarding the many elements necessary for her care that are not directly related to her oncology care. -ACEI held due to AKI and hyperkalemia - following with cardiology to mx this. Has been started on Amlodipine instead.  #15 Grade 1 mucositis- resolved -Advised the patient to continue use of Magic Mouthwash to aid with nutrition  #16 Newly diagnosed ?CAD - positive cardiac nuclear stress test Following with cardiology and nephrology ? Need for pre-operative cardiac cath  #17 Cholecystitis  - on hold due to cardiac issues noted on pre-operative evaluation.  PLAN:  -Discussed the 07/10/18 US Abdomen which revealed Biliary duct dilatation with 8 mm calculus at the level of the ampulla in the distal common bile duct. 2. Cholelithiasis. No gallbladder wall thickening or pericholecystic fluid. 3. Appearance of the liver raises concern for underlying hepatic cirrhosis. No focal liver lesions are demonstrable. 4.  Left kidney absent. 5.  Small cysts in right kidney. 6.  Aortic Atherosclerosis. -Advised that if pt has significant abdominal pain, given her above US findings, I recommend she present to the ED for an urgent ERCP, which she notes that she will do if necessary -I am also providing a referral to [place], if pt chooses to pursue outpatient work up -Discussed that the pt's abnormal liver functions and US findings are prohibitive from her receiving maintenance Nivolumab at this time -Recommend using Senna or Miralax for constipation -Continue with OTC Lidocaine patch for lower back pain  -Recommend that the pt keep well moisturized, suspecting that patient does have dry skin -Hydroxyzine as well for itching -Continue Xgeva every 4 weeks -Have held Sutent with considerations of CAD and intentions for cholecystectomy -Follow up with OBGYN regarding uterine prolapse and concern for urinary retention, which could explain mild increase in Creatinine -Recommended that the pt continue to eat well, drink at least 48-64 oz of water each day, and walk 20-30 minutes each day.  -Will see the pt back in 2 weeks    RTC with DR Irene Limbo in 2 weeks with labs, MD visit, and infusion Referral to [facility]  All of the patients questions  were answered with apparent satisfaction. The patient knows to call the clinic with any problems, questions or concerns.  The total time spent in the appt was 30 minutes and more than 50% was on counseling and direct patient cares.   Sullivan Lone MD [place] AAHIV[facility]  [facility] Hematology/Oncology Physician [facility]  (Office):       [phone] (Work cell):  [phone] (Fax):           [phone]  I, Baldwin Jamaica, am acting as a scribe for Dr. Sullivan Lone.   .I have reviewed the above documentation for accuracy and completeness, and I agree with the above. Brunetta Genera MD

## 2018-07-17 ENCOUNTER — Inpatient Hospital Stay: Payer: Medicare Other

## 2018-07-17 ENCOUNTER — Telehealth: Payer: Self-pay | Admitting: Gastroenterology

## 2018-07-17 ENCOUNTER — Inpatient Hospital Stay (HOSPITAL_BASED_OUTPATIENT_CLINIC_OR_DEPARTMENT_OTHER): Payer: Medicare Other | Admitting: Hematology

## 2018-07-17 ENCOUNTER — Telehealth: Payer: Self-pay | Admitting: Hematology

## 2018-07-17 DIAGNOSIS — K831 Obstruction of bile duct: Secondary | ICD-10-CM

## 2018-07-17 DIAGNOSIS — C7800 Secondary malignant neoplasm of unspecified lung: Secondary | ICD-10-CM

## 2018-07-17 DIAGNOSIS — C7951 Secondary malignant neoplasm of bone: Secondary | ICD-10-CM

## 2018-07-17 DIAGNOSIS — C642 Malignant neoplasm of left kidney, except renal pelvis: Secondary | ICD-10-CM | POA: Diagnosis not present

## 2018-07-17 DIAGNOSIS — R7989 Other specified abnormal findings of blood chemistry: Secondary | ICD-10-CM

## 2018-07-17 DIAGNOSIS — R945 Abnormal results of liver function studies: Secondary | ICD-10-CM

## 2018-07-17 NOTE — Telephone Encounter (Signed)
Hi Patty, we have received an urgent referral from [name] at the [facility] for an ERCP. Referral is in Epic. Pls advise on scheduling. Thank you.

## 2018-07-17 NOTE — Telephone Encounter (Signed)
Called and scheduled appt per 3/27 los.  Patient aware of appt date and time.

## 2018-07-17 NOTE — Telephone Encounter (Signed)
See note below. As DOD please advise

## 2018-07-17 NOTE — Telephone Encounter (Signed)
I have reviewed the patient's ultrasound which showed evidence of cholelithiasis and choledocholithiasis. I think it is reasonable to approach this patient with obtaining a hepatic function panel as well as direct bilirubin check on Monday. She can be scheduled as an EUS/ERCP on Wednesday with me at [facility] long on my outpatient morning. If her liver tests continue to be elevated or are worse than where they were when last checked I would likely proceed directly to an ERCP if however they have normalized or are better then I will consider an EUS before ERCP. Willing to hold off on cross-sectional imaging at this point. Thanks. GM

## 2018-07-20 ENCOUNTER — Other Ambulatory Visit (INDEPENDENT_AMBULATORY_CARE_PROVIDER_SITE_OTHER): Payer: Medicare Other

## 2018-07-20 ENCOUNTER — Other Ambulatory Visit: Payer: Self-pay

## 2018-07-20 DIAGNOSIS — K8011 Calculus of gallbladder with chronic cholecystitis with obstruction: Secondary | ICD-10-CM

## 2018-07-20 DIAGNOSIS — R7989 Other specified abnormal findings of blood chemistry: Secondary | ICD-10-CM

## 2018-07-20 DIAGNOSIS — R945 Abnormal results of liver function studies: Secondary | ICD-10-CM | POA: Diagnosis not present

## 2018-07-20 LAB — BILIRUBIN, DIRECT: Bilirubin, Direct: 0.1 mg/dL (ref 0.0–0.3)

## 2018-07-20 LAB — HEPATIC FUNCTION PANEL
ALT: 81 U/L — ABNORMAL HIGH (ref 0–35)
AST: 45 U/L — ABNORMAL HIGH (ref 0–37)
Albumin: 4.3 g/dL (ref 3.5–5.2)
Alkaline Phosphatase: 100 U/L (ref 39–117)
Bilirubin, Direct: 0.1 mg/dL (ref 0.0–0.3)
Total Bilirubin: 0.5 mg/dL (ref 0.2–1.2)
Total Protein: 7 g/dL (ref 6.0–8.3)

## 2018-07-20 NOTE — Telephone Encounter (Signed)
Lab orders in epic. Pt knows to come for labs today. Scheduled [facility] at [facility]@8am , pt to arrive there at 6:45am and be NPO after midnight. Pt knows driver must drop her off and stay in the car.

## 2018-07-22 ENCOUNTER — Encounter (HOSPITAL_COMMUNITY): Payer: Self-pay | Admitting: *Deleted

## 2018-07-22 ENCOUNTER — Telehealth: Payer: Self-pay | Admitting: Gastroenterology

## 2018-07-22 ENCOUNTER — Other Ambulatory Visit: Payer: Self-pay

## 2018-07-22 NOTE — Telephone Encounter (Signed)
Discussed with pt that she can take any meds she has to take as early in the am as possible with a  Small sip of water.

## 2018-07-22 NOTE — Progress Notes (Signed)
Spoke with patient regarding her pre-op instructions for tomorrow, 07/23/2018.  Patient denies chest pain, SOB, fever, cough, n/v.  Patient was instructed to stop now taking any Aspirin (unless otherwise instructed by your surgeon), Aleve, Naproxen, Ibuprofen, Motrin, Advil, Goody's, BC's, all herbal medications, fish oil, and all vitamins.   PCP/Cardiologist -  Dr [name]- [facility], [place]  Chest x-ray - denies EKG - 2019 (Dr Lacey Jensen) Stress Test - denies ECHO - 2019 (Dr Lacey Jensen) [place] (Dr Lacey Jensen)  Sleep Study - denies CPAP - N/A  Diabetic:  Does not check blood sugars, diet controlled, no medications.   Coronavirus Screening  Have you or your son Audrey [name] experienced the following symptoms:  Cough yes/no: No Fever (>100.21F)  yes/no: No Runny nose yes/no: No Sore throat yes/no: No Difficulty breathing/shortness of breath  yes/no: No  Have you or your son Audrey [name] traveled in the last 14 days and where? yes/no: No  Patient verbalized understanding of all pre-op instructions.  Patient informed of current Hospital Visitation Restriction Policy that is now in effect.

## 2018-07-22 NOTE — Telephone Encounter (Signed)
Pt is scheduled for procedure tomorrow at [facility] hosp with Dr. Rush Landmark, she has some questions regarding medications, she wants to know if she can take tylenol, tramadol and hydrocortisone tomorrow morning.

## 2018-07-22 NOTE — Progress Notes (Signed)
Called patient in regards to her procedure tomorrow, advised that whom ever was driving her would need to wait in the car, pt agrees. Pt. Denies any recent travel, fever, or respiratory problems for herself or her visitor. Advised to arrive at 645 am.

## 2018-07-23 ENCOUNTER — Ambulatory Visit (HOSPITAL_COMMUNITY)
Admission: RE | Admit: 2018-07-23 | Discharge: 2018-07-23 | Disposition: A | Discharge status: Home / Self Care | Payer: Medicare Other | Attending: Gastroenterology | Admitting: Gastroenterology

## 2018-07-23 ENCOUNTER — Encounter (HOSPITAL_COMMUNITY)
Admission: RE | Disposition: A | Discharge status: Home / Self Care | Payer: Self-pay | Source: Home / Self Care | Attending: Gastroenterology

## 2018-07-23 ENCOUNTER — Encounter (HOSPITAL_COMMUNITY): Payer: Self-pay | Admitting: Anesthesiology

## 2018-07-23 ENCOUNTER — Ambulatory Visit (HOSPITAL_COMMUNITY): Payer: Medicare Other | Admitting: Anesthesiology

## 2018-07-23 ENCOUNTER — Ambulatory Visit (HOSPITAL_COMMUNITY): Payer: Medicare Other

## 2018-07-23 ENCOUNTER — Other Ambulatory Visit: Payer: Self-pay

## 2018-07-23 DIAGNOSIS — I10 Essential (primary) hypertension: Secondary | ICD-10-CM | POA: Insufficient documentation

## 2018-07-23 DIAGNOSIS — K2960 Other gastritis without bleeding: Secondary | ICD-10-CM | POA: Diagnosis not present

## 2018-07-23 DIAGNOSIS — Z888 Allergy status to other drugs, medicaments and biological substances status: Secondary | ICD-10-CM | POA: Insufficient documentation

## 2018-07-23 DIAGNOSIS — M1990 Unspecified osteoarthritis, unspecified site: Secondary | ICD-10-CM | POA: Diagnosis not present

## 2018-07-23 DIAGNOSIS — Z87891 Personal history of nicotine dependence: Secondary | ICD-10-CM | POA: Diagnosis not present

## 2018-07-23 DIAGNOSIS — E785 Hyperlipidemia, unspecified: Secondary | ICD-10-CM | POA: Insufficient documentation

## 2018-07-23 DIAGNOSIS — K219 Gastro-esophageal reflux disease without esophagitis: Secondary | ICD-10-CM | POA: Insufficient documentation

## 2018-07-23 DIAGNOSIS — K2970 Gastritis, unspecified, without bleeding: Secondary | ICD-10-CM | POA: Diagnosis not present

## 2018-07-23 DIAGNOSIS — Z85528 Personal history of other malignant neoplasm of kidney: Secondary | ICD-10-CM | POA: Diagnosis not present

## 2018-07-23 DIAGNOSIS — K838 Other specified diseases of biliary tract: Secondary | ICD-10-CM

## 2018-07-23 DIAGNOSIS — I252 Old myocardial infarction: Secondary | ICD-10-CM | POA: Insufficient documentation

## 2018-07-23 DIAGNOSIS — Z9109 Other allergy status, other than to drugs and biological substances: Secondary | ICD-10-CM | POA: Insufficient documentation

## 2018-07-23 DIAGNOSIS — K8011 Calculus of gallbladder with chronic cholecystitis with obstruction: Secondary | ICD-10-CM

## 2018-07-23 DIAGNOSIS — Z905 Acquired absence of kidney: Secondary | ICD-10-CM | POA: Insufficient documentation

## 2018-07-23 DIAGNOSIS — E119 Type 2 diabetes mellitus without complications: Secondary | ICD-10-CM | POA: Insufficient documentation

## 2018-07-23 DIAGNOSIS — K8020 Calculus of gallbladder without cholecystitis without obstruction: Secondary | ICD-10-CM

## 2018-07-23 DIAGNOSIS — E039 Hypothyroidism, unspecified: Secondary | ICD-10-CM | POA: Diagnosis not present

## 2018-07-23 DIAGNOSIS — K8050 Calculus of bile duct without cholangitis or cholecystitis without obstruction: Secondary | ICD-10-CM

## 2018-07-23 DIAGNOSIS — K449 Diaphragmatic hernia without obstruction or gangrene: Secondary | ICD-10-CM | POA: Insufficient documentation

## 2018-07-23 HISTORY — DX: Unspecified osteoarthritis, unspecified site: M19.90

## 2018-07-23 HISTORY — DX: Hypothyroidism, unspecified: E03.9

## 2018-07-23 HISTORY — PX: BIOPSY: SHX5522

## 2018-07-23 HISTORY — DX: Personal history of other diseases of the digestive system: Z87.19

## 2018-07-23 HISTORY — DX: Acute myocardial infarction, unspecified: I21.9

## 2018-07-23 HISTORY — PX: SPHINCTEROTOMY: SHX5279

## 2018-07-23 HISTORY — DX: Gastro-esophageal reflux disease without esophagitis: K21.9

## 2018-07-23 HISTORY — DX: Malignant (primary) neoplasm, unspecified: C80.1

## 2018-07-23 HISTORY — PX: ERCP: SHX5425

## 2018-07-23 HISTORY — PX: REMOVAL OF STONES: SHX5545

## 2018-07-23 HISTORY — DX: Abnormal levels of other serum enzymes: R74.8

## 2018-07-23 HISTORY — DX: Presence of spectacles and contact lenses: Z97.3

## 2018-07-23 HISTORY — PX: BALLOON DILATION: SHX5330

## 2018-07-23 SURGERY — ERCP, WITH INTERVENTION IF INDICATED
Anesthesia: General

## 2018-07-23 MED ORDER — SUCCINYLCHOLINE CHLORIDE 20 MG/ML IJ SOLN
INTRAMUSCULAR | Status: DC | PRN
  Administered 2018-07-23: 100 mg via INTRAVENOUS

## 2018-07-23 MED ORDER — PROPOFOL 10 MG/ML IV BOLUS
INTRAVENOUS | Status: DC | PRN
  Administered 2018-07-23: 100 mg via INTRAVENOUS

## 2018-07-23 MED ORDER — CIPROFLOXACIN IN D5W 400 MG/200ML IV SOLN
400.0000 mg | Freq: Two times a day (BID) | INTRAVENOUS | Status: DC
  Administered 2018-07-23: 400 mg via INTRAVENOUS

## 2018-07-23 MED ORDER — DEXAMETHASONE SODIUM PHOSPHATE 10 MG/ML IJ SOLN
INTRAMUSCULAR | Status: DC | PRN
  Administered 2018-07-23: 5 mg via INTRAVENOUS

## 2018-07-23 MED ORDER — ONDANSETRON HCL 4 MG/2ML IJ SOLN
INTRAMUSCULAR | Status: DC | PRN
  Administered 2018-07-23: 4 mg via INTRAVENOUS

## 2018-07-23 MED ORDER — EPHEDRINE SULFATE 50 MG/ML IJ SOLN
INTRAMUSCULAR | Status: DC | PRN
  Administered 2018-07-23 (×2): 10 mg via INTRAVENOUS

## 2018-07-23 MED ORDER — GLUCAGON HCL RDNA (DIAGNOSTIC) 1 MG IJ SOLR
INTRAMUSCULAR | Status: AC
  Filled 2018-07-23: qty 1

## 2018-07-23 MED ORDER — LACTATED RINGERS IV SOLN: INTRAVENOUS | Status: DC | PRN

## 2018-07-23 MED ORDER — SODIUM CHLORIDE 0.9 % IV SOLN
INTRAVENOUS | Status: DC
  Administered 2018-07-23 (×2): via INTRAVENOUS

## 2018-07-23 MED ORDER — GLUCAGON HCL RDNA (DIAGNOSTIC) 1 MG IJ SOLR
INTRAMUSCULAR | Status: DC | PRN
  Administered 2018-07-23 (×4): 0.25 mg via INTRAVENOUS

## 2018-07-23 MED ORDER — FENTANYL CITRATE (PF) 100 MCG/2ML IJ SOLN
INTRAMUSCULAR | Status: DC | PRN
  Administered 2018-07-23: 100 ug via INTRAVENOUS

## 2018-07-23 MED ORDER — INDOMETHACIN 50 MG RE SUPP
RECTAL | Status: AC
  Filled 2018-07-23: qty 2

## 2018-07-23 MED ORDER — CIPROFLOXACIN IN D5W 400 MG/200ML IV SOLN
INTRAVENOUS | Status: AC
  Filled 2018-07-23: qty 200

## 2018-07-23 MED ORDER — INDOMETHACIN 50 MG RE SUPP
RECTAL | Status: DC | PRN
  Administered 2018-07-23: 100 mg via RECTAL

## 2018-07-23 MED ORDER — SODIUM CHLORIDE 0.9 % IV SOLN
INTRAVENOUS | Status: DC | PRN
  Administered 2018-07-23: 90 mL

## 2018-07-23 MED ORDER — LIDOCAINE HCL (CARDIAC) PF 100 MG/5ML IV SOSY
PREFILLED_SYRINGE | INTRAVENOUS | Status: DC | PRN
  Administered 2018-07-23: 80 mg via INTRAVENOUS

## 2018-07-23 MED ORDER — OMEPRAZOLE 20 MG PO CPDR: 20.0000 mg | DELAYED_RELEASE_CAPSULE | Freq: Two times a day (BID) | ORAL | 2 refills | Status: DC

## 2018-07-23 NOTE — Anesthesia Procedure Notes (Signed)
Procedure Name: Intubation Date/Time: 07/23/2018 8:19 AM Performed by: Megan Hayduk T, CRNA Pre-anesthesia Checklist: Patient identified, Emergency Drugs available, Suction available and Patient being monitored Patient Re-evaluated:Patient Re-evaluated prior to induction Oxygen Delivery Method: Circle system utilized Preoxygenation: Pre-oxygenation with 100% oxygen Induction Type: IV induction Laryngoscope Size: Miller and 2 Grade View: Grade I Tube type: Oral Tube size: 7.5 mm Number of attempts: 1 Airway Equipment and Method: Patient positioned with wedge pillow and Stylet Placement Confirmation: ETT inserted through vocal cords under direct vision,  positive ETCO2 and breath sounds checked- equal and bilateral Secured at: 20 cm Tube secured with: Tape Dental Injury: Teeth and Oropharynx as per pre-operative assessment

## 2018-07-23 NOTE — Anesthesia Postprocedure Evaluation (Signed)
Anesthesia Post Note  Patient: Audrey [name]  Procedure(s) Performed: ENDOSCOPIC RETROGRADE CHOLANGIOPANCREATOGRAPHY (ERCP) (N/A ) REMOVAL OF STONES SPHINCTEROTOMY BALLOON DILATION (N/A ) BIOPSY     Patient location during evaluation: PACU Anesthesia Type: General Level of consciousness: awake and alert Pain management: pain level controlled Vital Signs Assessment: post-procedure vital signs reviewed and stable Respiratory status: spontaneous breathing, nonlabored ventilation, respiratory function stable and patient connected to nasal cannula oxygen Cardiovascular status: blood pressure returned to baseline and stable Postop Assessment: no apparent nausea or vomiting Anesthetic complications: no    Last Vitals:  Vitals:   07/23/18 0947 07/23/18 0957  BP: (!) 153/63 (!) 136/54  Pulse: 65 64  Resp: 14 20  Temp: 36.5 C   SpO2: 100% 98%    Last Pain:  Vitals:   07/23/18 0957  TempSrc:   PainSc: 0-No pain                 Effie Berkshire

## 2018-07-23 NOTE — H&P (Addendum)
GASTROENTEROLOGY OUTPATIENT PROCEDURE H&P NOTE   Primary Care Physician: [name], MD  HPI: Audrey [name] is a 78 y.o. female who presents for ERCP +/- EUS.  Past Medical History:  Diagnosis Date  . Arthritis   . Cancer [facility])    renal cancer - left kidney removed, pill chemo x 1 yr  . Diabetes mellitus ([place])    type 2 - no meds, diet controlled  . Elevated liver enzymes   . GERD (gastroesophageal reflux disease)    occasional - diet controlled  . History of hiatal hernia   . HTN (hypertension)   . Hyperlipidemia   . Hypothyroidism   . Myocardial infarction ([place]) 1991   no deficits  . SVD (spontaneous vaginal delivery)    x 3  . Wears glasses    Past Surgical History:  Procedure Laterality Date  . COLONOSCOPY     normal   . IR IMAGING GUIDED PORT INSERTION  01/08/2018  . LAPAROSCOPIC NEPHRECTOMY Left 08/01/2016   Procedure: LAPAROSCOPIC  RADICAL NEPHRECTOMY/ REPAIR OF UMBILICAL HERNIA;  Surgeon: Raynelle Bring, MD;  Location: [facility];  Service: Urology;  Laterality: Left;  . UPPER GI ENDOSCOPY     x 1   Current Facility-Administered Medications  Medication Dose Route Frequency Provider Last Rate Last Dose  . 0.9 %  sodium chloride infusion   Intravenous Continuous Mansouraty, Telford Nab., MD 20 mL/hr at 07/23/18 [phone]    . lactated ringers infusion    Continuous PRN Mansouraty, Telford Nab., MD       Allergies  Allergen Reactions  . Adhesive [Tape] Other (See Comments)    Tears skin off Paper tape is ok per patient  . Nsaids Other (See Comments)    Liver damage  . Statins Other (See Comments)    Liver/kidney issues.   History reviewed. No pertinent family history. Social History   Socioeconomic History  . Marital status: Married    Spouse name: Not on file  . Number of children: 3  . Years of education: Not on file  . Highest education level: Not on file  Occupational History  . Not on file  Social Needs  . Financial resource strain: Not on file  .  Food insecurity:    Worry: Not on file    Inability: Not on file  . Transportation needs:    Medical: Not on file    Non-medical: Not on file  Tobacco Use  . Smoking status: Former Smoker    Packs/day: 1.00    Years: 8.00    Pack years: 8.00    Types: Cigarettes    Last attempt to quit: 06/18/1964    Years since quitting: 54.1  . Smokeless tobacco: Never Used  Substance and Sexual Activity  . Alcohol use: No  . Drug use: No  . Sexual activity: Not on file  Lifestyle  . Physical activity:    Days per week: Not on file    Minutes per session: Not on file  . Stress: Not on file  Relationships  . Social connections:    Talks on phone: Not on file    Gets together: Not on file    Attends religious service: Not on file    Active member of club or organization: Not on file    Attends meetings of clubs or organizations: Not on file    Relationship status: Not on file  . Intimate partner violence:    Fear of current or ex partner: Not on  file    Emotionally abused: Not on file    Physically abused: Not on file    Forced sexual activity: Not on file  Other Topics Concern  . Not on file  Social History Narrative  . Not on file    Physical Exam: Vital signs in last 24 hours: Temp:  [98 F (36.7 C)] 98 F (36.7 C) (04/02 0701) Pulse Rate:  [64] 64 (04/02 0701) Resp:  [18] 18 (04/02 0701) BP: (151)/(49) 151/49 (04/02 0701) SpO2:  [100 %] 100 % (04/02 0701) Weight:  [59.9 kg] 59.9 kg (04/02 0701)   GEN: NAD EYE: Sclerae anicteric ENT: MMM CV: RR without R/Gs  RESP: CTAB posteriorly GI: Soft, mild TTP in the RUQ NEURO:  Alert & Oriented x 3  Lab Results: No results for input(s): WBC, HGB, HCT, PLT in the last 72 hours. BMET No results for input(s): NA, K, CL, CO2, GLUCOSE, BUN, CREATININE, CALCIUM in the last 72 hours. LFT Recent Labs    07/20/18 1028  PROT 7.0  ALBUMIN 4.3  AST 45*  ALT 81*  ALKPHOS 100  BILITOT 0.5  BILIDIR 0.1  0.1   PT/INR No results  for input(s): LABPROT, INR in the last 72 hours.   Impression / Plan: This is a 78 y.o.female who presents for ERCP +/- EUS.  She is having typical biliary colic.  Previously had been referred for cholecystectomy but deferred due to her underlying comorbidities (not Surgery they had felt they could go forward) with it but by Cardiology.  She has had persistent pain and up to early this AM.    With clear stone on U/S in bile duct and LFTs not normalized and pain persisting will proceed with attempt at ERCP first.  The risks of an ERCP were discussed at length, including but not limited to the risk of perforation, bleeding, abdominal pain, post-ERCP pancreatitis (while usually mild can be severe and even life threatening).  The risks of EUS including bleeding, infection, aspiration pneumonia and intestinal perforation were discussed as was the possibility it may not give a definitive diagnosis.  If a biopsy of the pancreas is done as part of the EUS, there is an additional risk of pancreatitis at the rate of about 1%.  It was explained that procedure related pancreatitis is typically mild, although can be severe and even life threatening, which is why we do not perform random pancreatic biopsies and only biopsy a lesion we feel is concerning enough to warrant the risk.   The risks and benefits of endoscopic evaluation were discussed with the patient; these include but are not limited to the risk of perforation, infection, bleeding, missed lesions, lack of diagnosis, severe illness requiring hospitalization, as well as anesthesia and sedation related illnesses.  The patient is agreeable to proceed.    Justice Britain, MD [facility] Advanced [facility]oscopy Office # [phone]

## 2018-07-23 NOTE — Discharge Instructions (Signed)
YOU HAD AN ENDOSCOPIC PROCEDURE TODAY: Refer to the procedure report and other information in the discharge instructions given to you for any specific questions about what was found during the examination. If this information does not answer your questions, please call [place] office at [phone] to clarify.   YOU SHOULD EXPECT: Some feelings of bloating in the abdomen. Passage of more gas than usual. Walking can help get rid of the air that was put into your GI tract during the procedure and reduce the bloating. If you had a lower endoscopy (such as a colonoscopy or flexible sigmoidoscopy) you may notice spotting of blood in your stool or on the toilet paper. Some abdominal soreness may be present for a day or two, also.  DIET: Your first meal following the procedure should be a light meal and then it is ok to progress to your normal diet. A half-sandwich or bowl of soup is an example of a good first meal. Heavy or fried foods are harder to digest and may make you feel nauseous or bloated. Drink plenty of fluids but you should avoid alcoholic beverages for 24 hours. If you had a esophageal dilation, please see attached instructions for diet.    ACTIVITY: Your care partner should take you home directly after the procedure. You should plan to take it easy, moving slowly for the rest of the day. You can resume normal activity the day after the procedure however YOU SHOULD NOT DRIVE, use power tools, machinery or perform tasks that involve climbing or major physical exertion for 24 hours (because of the sedation medicines used during the test).   SYMPTOMS TO REPORT IMMEDIATELY: A gastroenterologist can be reached at any hour. Please call [phone]  for any of the following symptoms:   Following upper endoscopy (EGD, EUS, ERCP, esophageal dilation) Vomiting of blood or coffee ground material  New, significant abdominal pain  New, significant chest pain or pain under the shoulder blades  Painful or  persistently difficult swallowing  New shortness of breath  Black, tarry-looking or red, bloody stools  FOLLOW UP:  If any biopsies were taken you will be contacted by phone or by letter within the next 1-3 weeks. Call [phone]  if you have not heard about the biopsies in 3 weeks.  Please also call with any specific questions about appointments or follow up tests.  

## 2018-07-23 NOTE — Op Note (Addendum)
[facility] Patient Name: Audrey [name] Procedure Date : 07/23/2018 MRN: [id number] Attending MD: Justice Britain , MD Date of Birth: September 24, 1940 CSN: [id number] Age: 78 Admit Type: Outpatient Procedure:                ERCP Indications:              Bile duct stone(s), Bile duct stone on Ultrasound,                            Abnormal liver function test Providers:                Justice Britain, MD, Carlyn Reichert, RN, William Dalton, Technician Referring MD:             Stark Klein MD, MD, Brunetta Genera Medicines:                Monitored Anesthesia Care Complications:            No immediate complications. Estimated Blood Loss:     Estimated blood loss was minimal. Procedure:                Pre-Anesthesia Assessment:                           - Prior to the procedure, a History and Physical                            was performed, and patient medications and                            allergies were reviewed. The patient's tolerance of                            previous anesthesia was also reviewed. The risks                            and benefits of the procedure and the sedation                            options and risks were discussed with the patient.                            All questions were answered, and informed consent                            was obtained. Prior Anticoagulants: The patient has                            taken no previous anticoagulant or antiplatelet                            agents. ASA Grade Assessment: III - A patient with  severe systemic disease. After reviewing the risks                            and benefits, the patient was deemed in                            satisfactory condition to undergo the procedure.                            Fluoroscopy imaging on CANOPY.                           After obtaining informed consent, the scope was     passed under direct vision. Throughout the                            procedure, the patient's blood pressure, pulse, and                            oxygen saturations were monitored continuously. The                            TJF-Q180V ([id number] Olympus duodenoscope was                            introduced through the mouth, and used to inject                            contrast into and used to inject contrast into the                            bile duct. The ERCP was accomplished without                            difficulty. The patient tolerated the procedure. Scope In: Scope Out: Findings:      The scout film was normal.      The upper GI tract was traversed under direct vision without detailed       examination. Scattered mild inflammation characterized by erosions was       found in the entire examined stomach. No gross lesions were noted in the       duodenal bulb, in the first portion of the duodenum and in the second       portion of the duodenum. Biopsies were taken on the greater curvature of       the stomach, on the lesser curvature of the stomach, at the incisura and       in the gastric antrum through the ERCP scope with the cold forceps for       histology. The major papilla was bulging and patulous with concern for a       possible impacted stone.      Initial attempts at placement of the wire led to some curing within the       distal duct, but eventually with placing the tome further into the       ampullary region, a short 0.035 inch Soft Jagwire  was able to be passed       into the biliary tree. The Jagtome sphincterotome was then further       passed over the guidewire and the bile duct was then deeply cannulated.       Contrast was injected. I personally interpreted the bile duct images.       Ductal flow of contrast was adequate. Image quality was adequate.       Contrast extended to the hepatic ducts. Opacification of the entire       biliary tree was  successful. The lower third of the main duct contained       filling defects thought to be stone debris vs sludge. The middle third       of the main bile duct and upper third of the main bile duct were       moderately dilated. The largest diameter was 14 mm. A 7 mm biliary       sphincterotomy was made with a monofilament Jagtome sphincterotome using       ERBE electrocautery. There was self limited oozing from the       sphincterotomy which did not require treatment. The biliary tree was       swept with a retrieval balloon starting at the bifurcation. Two stones       were removed. No stones remained. An occlusion cholangiogram was       performed. There was some very slight duct caliber changes in the very       distal CBD and although there was good flow, I decided to proceed with       dilation of the distal common bile duct with a 6 mm Hurricane balloon       dilator which was successful.      A pancreatogram was not performed.      The duodenoscope was withdrawn from the patient. Impression:               - Mild erosive gastritis. Biopsies were taken with                            a cold forceps for histology on the greater                            curvature of the stomach, on the lesser curvature                            of the stomach, at the incisura and in the gastric                            antrum.                           - No other gross lesions in the duodenal bulb, in                            the first portion of the duodenum and in the second                            portion of the duodenum.                           -  The major papilla appeared to be bulging and                            patulous.                           - Filling defects consistent with stones v sludge                            were seen on the cholangiogram.                           - The upper third of the main bile duct and middle                            third of the main bile  duct were moderately dilated.                           - Choledocholithiasis was found. Complete removal                            was accomplished by biliary sphincterotomy, ballon                            dilation of the CBD and balloo trawl. Recommendation:           - The patient will be observed post-procedure,                            until all discharge criteria are met.                           - Discharge patient to home.                           - Patient has a contact number available for                            emergencies. The signs and symptoms of potential                            delayed complications were discussed with the                            patient. Return to normal activities tomorrow.                            Written discharge instructions were provided to the                            patient.                           - Observe patient's clinical course.                           -  Check liver enzymes (AST, ALT, alkaline                            phosphatase, bilirubin) in 1-2 weeks.                           - Start Omeprazole 20 mg daily (Rx sent to                            pharmacy).                           - Minimize NSAIDs as able to decrease risk of                            post-ERCP sphincterotomy bleeding for the next week                            if possible.                           - Await Pathology.                           - Watch for pancreatitis, bleeding, perforation,                            and cholangitis.                           - Referral back to surgery to discuss                            reconsideration of cholecystectomy due to now                            having had obstructive pathology from her                            gallbladder stone disease.                           - The findings and recommendations were discussed                            with the patient.                           -  The findings and recommendations were discussed                            with the patient's family. Procedure Code(s):        --- Professional ---                           [phone], Endoscopic retrograde  cholangiopancreatography (ERCP); with removal of                            calculi/debris from biliary/pancreatic duct(s) Diagnosis Code(s):        --- Professional ---                           K29.70, Gastritis, unspecified, without bleeding                           K83.8, Other specified diseases of biliary tract                           K80.50, Calculus of bile duct without cholangitis                            or cholecystitis without obstruction                           R94.5, Abnormal results of liver function studies                           R93.2, Abnormal findings on diagnostic imaging of                            liver and biliary tract CPT copyright 2019 American Medical Association. All rights reserved. The codes documented in this report are preliminary and upon coder review may  be revised to meet current compliance requirements. Justice Britain, MD 07/23/2018 10:03:19 AM Number of Addenda: 0

## 2018-07-23 NOTE — Anesthesia Preprocedure Evaluation (Addendum)
Anesthesia Evaluation  Patient identified by MRN, date of birth, ID band Patient awake    Reviewed: Allergy & Precautions, NPO status , Patient's Chart, lab work & pertinent test results  Airway Mallampati: I  TM Distance: >3 FB Neck ROM: Full    Dental  (+) Teeth Intact, Dental Advisory Given, Missing,    Pulmonary former smoker,    Pulmonary exam normal        Cardiovascular hypertension, Pt. on medications and Pt. on home beta blockers + Past MI   Rhythm:Regular Rate:Normal     Neuro/Psych negative neurological ROS     GI/Hepatic Neg liver ROS, hiatal hernia, GERD  ,  Endo/Other  diabetesHypothyroidism   Renal/GU Renal disease     Musculoskeletal  (+) Arthritis ,   Abdominal Normal abdominal exam  (+)   Peds  Hematology negative hematology ROS (+)   Anesthesia Other Findings   Reproductive/Obstetrics                            Anesthesia Physical Anesthesia Plan  ASA: III  Anesthesia Plan: General   Post-op Pain Management:    Induction: Intravenous  PONV Risk Score and Plan: 4 or greater and Ondansetron and Treatment may vary due to age or medical condition  Airway Management Planned: Oral ETT  Additional Equipment: None  Intra-op Plan:   Post-operative Plan: Extubation in OR  Informed Consent: I have reviewed the patients History and Physical, chart, labs and discussed the procedure including the risks, benefits and alternatives for the proposed anesthesia with the patient or authorized representative who has indicated his/her understanding and acceptance.     Dental advisory given  Plan Discussed with: CRNA  Anesthesia Plan Comments:        Anesthesia Quick Evaluation

## 2018-07-23 NOTE — Transfer of Care (Signed)
Immediate Anesthesia Transfer of Care Note  Patient: Dorethea Clan Cid  Procedure(s) Performed: ENDOSCOPIC RETROGRADE CHOLANGIOPANCREATOGRAPHY (ERCP) (N/A ) REMOVAL OF STONES SPHINCTEROTOMY BALLOON DILATION (N/A ) BIOPSY  Patient Location: Endoscopy Unit  Anesthesia Type:General  Level of Consciousness: awake, alert  and oriented  Airway & Oxygen Therapy: Patient Spontanous Breathing and Patient connected to nasal cannula oxygen  Post-op Assessment: Report given to RN, Post -op Vital signs reviewed and stable and Patient moving all extremities  Post vital signs: Reviewed and stable  Last Vitals:  Vitals Value Taken Time  BP 153/63 07/23/2018  9:47 AM  Temp 36.5 C 07/23/2018  9:47 AM  Pulse 65 07/23/2018  9:47 AM  Resp 14 07/23/2018  9:47 AM  SpO2 100 % 07/23/2018  9:47 AM    Last Pain:  Vitals:   07/23/18 0947  TempSrc: Axillary  PainSc: 0-No pain         Complications: No apparent anesthesia complications

## 2018-07-24 ENCOUNTER — Other Ambulatory Visit: Payer: Self-pay

## 2018-07-24 ENCOUNTER — Telehealth: Payer: Self-pay

## 2018-07-24 ENCOUNTER — Encounter: Payer: Self-pay | Admitting: Gastroenterology

## 2018-07-24 DIAGNOSIS — R945 Abnormal results of liver function studies: Principal | ICD-10-CM

## 2018-07-24 DIAGNOSIS — R7989 Other specified abnormal findings of blood chemistry: Secondary | ICD-10-CM

## 2018-07-24 NOTE — Telephone Encounter (Signed)
-----   Message from Irving Copas., MD sent at 07/23/2018  3:41 PM EDT ----- Chong Sicilian or Covering RN, This patient will need a HFP in 2-weeks with a Telehealth visit a few days later. Can you help set that up? Thanks. GM

## 2018-07-24 NOTE — Telephone Encounter (Signed)
Lab order in epic. Pt scheduled for telephone visit 08/11/18@2 :30pm. Appt letter mailed to pt.

## 2018-07-26 ENCOUNTER — Encounter (HOSPITAL_COMMUNITY): Payer: Self-pay | Admitting: Gastroenterology

## 2018-07-27 ENCOUNTER — Telehealth: Payer: Self-pay | Admitting: Gastroenterology

## 2018-07-27 NOTE — Telephone Encounter (Signed)
The pt states she will have her labs done at the cancer center on 4/10 and 4/24.  We will have access

## 2018-07-29 ENCOUNTER — Telehealth: Payer: Self-pay | Admitting: Gastroenterology

## 2018-07-29 DIAGNOSIS — R7989 Other specified abnormal findings of blood chemistry: Secondary | ICD-10-CM

## 2018-07-29 DIAGNOSIS — R945 Abnormal results of liver function studies: Principal | ICD-10-CM

## 2018-07-29 DIAGNOSIS — R109 Unspecified abdominal pain: Secondary | ICD-10-CM

## 2018-07-29 NOTE — Telephone Encounter (Signed)
The pt woke up with dull ache across the whole abdomen.  Feels very tight.  Has no fever, last BM was 3 days ago.  Has a history of constipation.  Takes tramadol for pain.  Took 50 mg tramadol an hour ago with no relief.  Had ERCP last Thursday for bile duct stones.  Feels bloated.  She states she has mag citrate at home but wants to be sure this is not pancreatitis before she does the bowel purge.

## 2018-07-30 NOTE — Telephone Encounter (Signed)
The pt states she is feeling much better and will do a bowel purge of her choice and return call if symptoms return

## 2018-07-30 NOTE — Telephone Encounter (Signed)
Would be rare to be post-procedural pancreatitis almost a week out but still worthwhile to consider workup. If the patient is still having pain/discomfort, then I would proceed with laboratory workup Amylase/Lipase/HFP/BMP/CBC to ensure no significant issues there.  She should have a KUB 2-view as well performed. I would recommend that she go ahead and try to work on purging her bowels as she has used in the past, but can use her normal laxative medications or can use Miralax or Dulcolax OTC.   There is little downside to doing that currently or concomitantly, even if she had pancreatitis, which I suspect is unlikely. Thank you. GM

## 2018-07-30 NOTE — Progress Notes (Signed)
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 The patient's last visit with [name] was on 07/17/18. The pt reports that she is doing well overall.   In the interim, the pt was able to have an ERCP on 07/23/18 with Dr. Justice Britain.  The pt reports that her abdominal pains improved after her surgery, however today she notes returned sharp upper abdominal pain. She called her GI and notes she was recommended to take Miralax, which she did, and has achieved a bowel movement without cessation of her pain. She notes that she is using Tramadol for her pain which has not fully addressed her pain. She notes that the pain is constant, but is less at night. She notes that she has not eaten today, because when she eats the pain worsens.   Lab results today (07/31/18) of CBC w/diff is as follows: all values are WNL except for HGB at 11.8, Monocytes abs at 1.3k, Abs immature.  On review of systems, pt reports sharp upper abdominal pain, not eating well, constipation but moving bowels occasionally, and denies any other symptoms.   MEDICAL HISTORY:    Hypertension Dyslipidemia Osteoarthritis Ex-smoker Coronary artery disease Thyroid disorder-was apparently on levothyroxine 25 g daily which has subsequently been discontinued . 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  Earley Brooke Colonoscopy 03/2016 Dr. Earley Brooke  SOCIAL HISTORY: Social History   Socioeconomic History   Marital status: Married    Spouse name: Not on file   Number of children: 3   Years of education: Not on file   Highest education level: Not on file  Occupational History   Not on file  Social Needs   Financial resource strain: Not on file   Food insecurity:    Worry: Not on file    Inability: Not on file   Transportation needs:    Medical: Not on file    Non-medical: Not on file  Tobacco Use   Smoking status: Former Smoker    Packs/day: 1.00    Years: 8.00    Pack years: 8.00    Types: Cigarettes    Last attempt to quit: 06/18/1964    Years since quitting: 54.1   Smokeless tobacco: Never Used  Substance and Sexual Activity   Alcohol use: No   Drug use: No   Sexual activity: Not on file  Lifestyle   Physical activity:    Days per week: Not on file    Minutes per session: Not on file   Stress: Not on file  Relationships   Social connections:    Talks on phone: Not on file    Gets together: Not on file    Attends religious service: Not on file    Active member of club or organization: Not on file    Attends meetings of clubs or organizations: Not on file    Relationship status: Not on file   Intimate  partner violence:    Fear of current or ex partner: Not on file    Emotionally abused: Not on file    Physically abused: Not on file    Forced sexual activity: Not on file  Other Topics Concern   Not on file  Social History Narrative   Not on file   Patient lives in [place] Works as a Solicitor part-time Ex-smoker quit long time ago In 1961 . Smoked 2 packs per day for 8 years prior to that .  or a lot of stress since her daughter is also getting treated for stage IV uterine cancer.  FAMILY HISTORY:  Mother deceased Father with asthma and emphysema died at 47 years with the MI. Brother deceased of heart disease   ALLERGIES:   is allergic to adhesive [tape]; nsaids; and statins.  MEDICATIONS:  Current Outpatient Medications  Medication Sig Dispense Refill   acetaminophen (TYLENOL) 500 MG tablet Take 1,000 mg by mouth 2 (two) times daily.      amLODipine (NORVASC) 5 MG tablet Take 5 mg by mouth daily.  6   atorvastatin (LIPITOR) 40 MG tablet Take 40 mg by mouth daily at 6 PM.      Calcium 600-200 MG-UNIT tablet Take 1 tablet by mouth daily at 12 noon.      carvedilol (COREG) 6.25 MG tablet Take 6.25 mg by mouth 2 (two) times daily.  6   clobetasol ointment (TEMOVATE) 0.73 % Apply 1 application topically 2 (two) times daily as needed (for skin irritation.).      denosumab (XGEVA) 120 MG/1.7ML SOLN injection Inject 120 mg into the skin every 6 (six) weeks.     hydrocortisone (CORTEF) 10 MG tablet Take 5-10 mg by mouth See admin instructions. Take 1 tablet (10 mg) by mouth in the [name] & 0.5 tablet (5 mg) by mouth in the evening.     levothyroxine (SYNTHROID, LEVOTHROID) 50 MCG tablet Take 50 mcg by mouth daily before breakfast.      lidocaine-prilocaine (EMLA) cream Apply to affected area once (Patient taking differently: Apply 1 application topically daily as needed (prior to port access). ) 30 g 3   Nivolumab (OPDIVO IV) Inject 1 Dose into the vein every 14 (fourteen) days.     omeprazole (PRILOSEC) 20 MG capsule Take 1 capsule (20 mg total) by mouth 2 (two) times daily. 60 capsule 2   oxyCODONE (OXY IR/ROXICODONE) 5 MG immediate release tablet Take 1 tablet (5 mg total) by mouth every 6 (six) hours as needed for severe pain. 30 tablet 0   senna-docusate (SENNA S) 8.6-50 MG tablet Take 2 tablets by mouth at bedtime. 60 tablet 1   No current facility-administered medications for this visit.     REVIEW OF SYSTEMS:    A 10+ POINT REVIEW OF SYSTEMS WAS OBTAINED including neurology, dermatology, psychiatry, cardiac, respiratory, lymph, extremities, GI, GU, Musculoskeletal, constitutional, breasts,  reproductive, HEENT.  All pertinent positives are noted in the HPI.  All others are negative.   PHYSICAL EXAMINATION:  ECOG PERFORMANCE STATUS: 1 - Symptomatic but completely ambulatory  Vitals:   07/31/18 1003  BP: 136/69  Pulse: 72  Resp: 18  Temp: 98.9 F (37.2 C)  SpO2: 100%   Filed Weights   07/31/18 1003  Weight: 130 lb 12.3 oz (59.3 kg)   .Body mass index is 26.41 kg/m.   GENERAL:alert, in no acute distress and comfortable SKIN: no acute rashes, no significant lesions EYES: conjunctiva are pink and non-injected, sclera  anicteric OROPHARYNX: MMM, no exudates, no oropharyngeal erythema or ulceration NECK: supple, no JVD LYMPH:  no palpable lymphadenopathy in the cervical, axillary or inguinal regions LUNGS: clear to auscultation b/l with normal respiratory effort HEART: regular rate & rhythm ABDOMEN:  normoactive bowel sounds , tender to palpation at the epigastric area, negative Murphy's sign, not distended. No palpable hepatosplenomegaly.  Extremity: no pedal edema PSYCH: alert & oriented x 3 with fluent speech NEURO: no focal motor/sensory deficits   LABORATORY DATA:  I have reviewed the data as listed  . CBC Latest Ref Rng & Units 07/31/2018 07/03/2018 06/19/2018  WBC 4.0 - 10.5 K/uL 9.8 7.9 6.8  Hemoglobin 12.0 - 15.0 g/dL 11.8(L) 11.7(L) 11.5(L)  Hematocrit 36.0 - 46.0 % 37.3 36.6 35.8(L)  Platelets 150 - 400 K/uL 256 250 255    CBC    Component Value Date/Time   WBC 9.8 07/31/2018 0903   RBC 3.91 07/31/2018 0903   HGB 11.8 (L) 07/31/2018 0903   HGB 11.2 (L) 02/27/2018 0835   HGB 11.2 (L) 04/04/2017 0830   HCT 37.3 07/31/2018 0903   HCT 35.2 04/04/2017 0830   PLT 256 07/31/2018 0903   PLT 219 02/27/2018 0835   PLT 250 04/04/2017 0830   MCV 95.4 07/31/2018 0903   MCV 107.6 (H) 04/04/2017 0830   MCH 30.2 07/31/2018 0903   MCHC 31.6 07/31/2018 0903   RDW 13.5 07/31/2018 0903   RDW 14.0 04/04/2017 0830   LYMPHSABS 1.1 07/31/2018 0903   LYMPHSABS  1.6 04/04/2017 0830   MONOABS 1.3 (H) 07/31/2018 0903   MONOABS 0.4 04/04/2017 0830   EOSABS 0.2 07/31/2018 0903   EOSABS 0.1 04/04/2017 0830   BASOSABS 0.0 07/31/2018 0903   BASOSABS 0.0 04/04/2017 0830     . CMP Latest Ref Rng & Units 07/20/2018 07/03/2018 06/19/2018  Glucose 70 - 99 mg/dL - 158(H) 166(H)  BUN 8 - 23 mg/dL - 27(H) 28(H)  Creatinine 0.44 - 1.00 mg/dL - 1.71(H) 1.79(H)  Sodium 135 - 145 mmol/L - 134(L) 135  Potassium 3.5 - 5.1 mmol/L - 4.7 4.6  Chloride 98 - 111 mmol/L - 100 103  CO2 22 - 32 mmol/L - 22 23  Calcium 8.9 - 10.3 mg/dL - 10.5(H) 9.7  Total Protein 6.0 - 8.3 g/dL 7.0 6.8 6.9  Total Bilirubin 0.2 - 1.2 mg/dL 0.5 0.7 0.5  Alkaline Phos 39 - 117 U/L 100 105 74  AST 0 - 37 U/L 45(H) 68(H) 48(H)  ALT 0 - 35 U/L 81(H) 178(H) 95(H)   B12 level 299 (OSH)-->455  . Lab Results  Component Value Date   LDH 136 03/13/2018         RADIOGRAPHIC STUDIES: I have personally reviewed the radiological images as listed and agreed with the findings in the report. Nm Pet Image Restag (ps) Skull Base To Thigh  Result Date: 01/03/2017 IMPRESSION: 1. There is a new lesion in the hepatic dome which is FDG avid and low in attenuation on CT imaging consistent with metastatic disease. Another region of uptake in the left hepatic lobe posteriorly demonstrates no CT correlate. A second subtle metastasis is not excluded on today's study. An MRI could better assess for other hepatic metastases if clinically warranted. 2. New metastatic lesion in the left side of T8. 3. New pulmonary nodule in the right upper lobe. This is too small to characterize but suspicious. Recommend attention on follow-up. No FDG avid nodules or other enlarging nodules. 4. The uptake at the previous left  renal artery has almost resolved in the interval and is favored to be post therapeutic. Recommend attention on follow-up. 5. The metastasis in the posterior right hilum seen previously has resolved.  Electronically Signed   By: Dorise Bullion III M.D   On: 01/03/2017 11:16   .US Abdomen Complete  Result Date: 07/10/2018 CLINICAL DATA:  Abnormal liver enzymes. History of renal cell carcinoma EXAM: ABDOMEN ULTRASOUND COMPLETE COMPARISON:  PET-CT April 03, 2018 FINDINGS: Gallbladder: Within the gallbladder, there are echogenic foci which move and shadow consistent with cholelithiasis. Largest individual gallstone measures 1.7 cm in length. There is no appreciable gallbladder wall thickening or pericholecystic fluid. No sonographic Murphy sign noted by sonographer. Common bile duct: Diameter: There is dilatation of the common bile duct to 12 mm. A calculus is seen in the distal most aspect of the common bile duct near the ampulla measuring 8 mm. There is slight intrahepatic biliary duct dilatation as well. Liver: No focal lesion identified. Liver has a somewhat nodular contour concerning for underlying cirrhosis. The echotexture of the liver is rather coarse. Portal vein is patent on color Doppler imaging with normal direction of blood flow towards the liver. IVC: No abnormality visualized. Pancreas: No pancreatic mass or inflammatory focus. Spleen: Size and appearance within normal limits. Right Kidney: Length: 11.5 cm. Echogenicity within normal limits. There is a cyst arising from the upper pole of the right kidney measuring 0.9 x 0.7 x 0.5 cm. There are several small parapelvic cysts, largest measuring 1.3 x 1.0 cm. No perinephric fluid or hydronephrosis visualized. Left Kidney: Surgically absent. No lesion is evident in the renal fossa region on the left. Abdominal aorta: No aneurysm visualized. There is extensive aortic atherosclerosis. Other findings: No demonstrable ascites. IMPRESSION: 1. Biliary duct dilatation with 8 mm calculus at the level of the ampulla in the distal common bile duct. 2. Cholelithiasis. No gallbladder wall thickening or pericholecystic fluid. 3. Appearance of the liver raises  concern for underlying hepatic cirrhosis. No focal liver lesions are demonstrable. 4.  Left kidney absent. 5.  Small cysts in right kidney. 6.  Aortic Atherosclerosis (ICD10-I70.0). These results will be called to the ordering clinician or representative by the Radiologist Assistant, and communication documented in the PACS or zVision Dashboard. Electronically Signed   By: Lowella Grip III M.D.   On: 07/10/2018 09:34   Dg Ercp Biliary & Pancreatic Ducts  Result Date: 07/23/2018 CLINICAL DATA:  78 year old female with a history of choledocholithiasis EXAM: ERCP TECHNIQUE: Multiple spot images obtained with the fluoroscopic device and submitted for interpretation post-procedure. FLUOROSCOPY TIME:  Fluoroscopy Time:  3 minutes 37 seconds COMPARISON:  None. FINDINGS: Multiple intraoperative fluoroscopic spot images of ERCP. First image demonstrates endoscope projecting over the upper abdomen. There is then cannulation of the ampulla and retrograde infusion of contrast partially opacifying the extrahepatic biliary ducts. Deployment of a retrieval balloon. IMPRESSION: Limited images of ERCP demonstrates partial opacification of the extrahepatic biliary ducts with deployment of a retrieval balloon for treatment of choledocholithiasis. Please refer to the dictated operative report for full details of intraoperative findings and procedure. Electronically Signed   By: Corrie Mckusick D.O.   On: 07/23/2018 09:58    ASSESSMENT & PLAN:    78 y.o. 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 PET/CT 06/19/2017 -- consistent with partial metabolic response to treatment.  Rt adrenal gland bx - showed clear  cell RCC  S/p CYtoreductive left radical nephrectomy and left adrenal gland resection on 08/01/2016 by Dr Alinda Money.  10/16/17 PET/CT revealed Continued improvement, with the T7 metastatic  lesion no longer significantly hypermetabolic. The previous right lower lobe pulmonary nodule is even less apparent, perhaps about 2 mm in diameter today; given that this measured 6 mm on 03/28/2017 this probably represents an effectively treated metastatic lesion. Currently no appreciable hypermetabolic activity is identified to suggest active malignancy. Distended gallbladder with gallbladder wall thickening and gallstones. Correlate clinically in assessing for cholecystitis. Small but abnormal amount of free pelvic fluid, nonspecific. Other imaging findings of potential clinical significance: Chronic ethmoid sinusitis. Aortic Atherosclerosis. Stable 5 mm right middle lobe pulmonary nodule, not hypermetabolic but below sensitive PET-CT size thresholds. Left nephrectomy. Notable pelvic floor laxity with cystocele. Chronic bilateral Sacroiliitis.  04/03/18 PET/CT revealed No evidence for new or progressive hypermetabolic disease on today's study to suggest metastatic progression. 2. Stable appearance of the T7 metastatic lesion without Hypermetabolism. 3. Tiny focus of FDG accumulation identified along the skin of the low right inguinal fold. No associated lesion evident on CT. This may be related to urinary contaminant. 4. Cholelithiasis with similar appearance of diffuse gallbladder wall thickening. 5. Stable 5 mm right middle lobe pulmonary nodule. 6.  Aortic Atherosclerois. 7. Diffuse colonic diverticulosis.   #2 b/l adrenal metastases from [place] s/p left adrenalectomy with adrenal insuff - follows with Dr Buddy Duty.  #3 Small pulmonary lesions -- 10/16/17 PET/CT showed pulmonary less apparent than 03/28/17 PET/CT, decreasing from 37mm to 38mm diameter.   MRI brain shows no evidence of metastatic disease  #4 T7/8 Bone metastases - received Xgeva every 4 weeks from May 2018 to June 2019. 10/16/17 PET/CT revealed improvement with T7 metastatic lesion no longer significantly hypermetabolic.  -on Marchelle Folks  #5  ?liver mets- rpt PET/CT from 10/16/2017 shows no overt evidence of metastatic disease in the liver.  #6 Grade 1 Nausea - improved and intermittent. Hasnt used her anti-emetic as instructed so some nausea and decreased po intake.  #7 Grade 1 Diarrhea - resolved  #8 Hyponatremia -  resolved with sodium at 137 - likely related to some element of adrenal insufficiency, diarrhea, limited by mouth intake. Primarily solute free fluid intake.   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 Buddy Duty  #14 HTN - ?control. Patient tends to be anxious and has higher blood pressures in the clinic. She can have increased blood pressure from Sutent as well. Plan: -continued close f/u with her PCP /cardiology regarding the many elements necessary for her care that are not directly related to her oncology care. -ACEI held due to AKI and hyperkalemia - following with cardiology to mx this. Has been started on Amlodipine instead.  #15 Grade 1 mucositis- resolved -Advised the patient to continue use of Magic Mouthwash to aid with nutrition  #  50 Newly diagnosed ?CAD - positive cardiac nuclear stress test Following with cardiology and nephrology ? Need for pre-operative cardiac cath  #17 Choledocholithiasis and  cholelithiasis 07/10/18 US Abdomen revealed Biliary duct dilatation with 8 mm calculus at the level of the ampulla in the distal common bile duct. 2. Cholelithiasis. No gallbladder wall thickening or pericholecystic fluid. 3. Appearance of the liver raises concern for underlying hepatic cirrhosis. No focal liver lesions are demonstrable. 4.  Left kidney absent. 5.  Small cysts in right kidney. 6.  Aortic Atherosclerosis.  S/p ERCP on 07/23/18 with Dr. Valarie Merino Mansouraty  PLAN:  -Discussed pt labwork today, 07/31/18; blood counts are stable -Chemistries - show bump in AST and ALT, in the context of abdominal discomfort lipase, and amylase were done and are borderline elevated -Will order 5mg  Oxycodone -Strongly advised the pt to call her GI Dr. Donneta Romberg office today to let them know that she has achieved bowel movements with Miralax, without resolution of her pain. Pain worsens with eating. She notes she will call today.  -Pt notes that she will call her general surgeon Dr. Barry Dienes today as well -Begin full liquid diet with gradual advancement -Per the above, and the pt's significant discomfort, will hold Nivolumab infusion scheduled for today -Recommend using Senna or Miralax for constipation -Continue with OTC Lidocaine patch for lower back pain  -Recommend that the pt keep well moisturized, suspecting that patient does have dry skin -Hydroxyzine as well for itching -Continue Xgeva every 4 weeks -Have held Sutent with considerations of CAD and intentions for cholecystectomy -Follow up with OBGYN regarding uterine prolapse and concern for urinary retention, which could explain mild increase in Creatinine -Recommended that the pt continue to eat well, drink at least 48-64 oz of water each day, and walk 20-30 minutes each day.  -Will see the pt back in 4 weeks with labs and next treatment.   RTC with [place] with labs and next treatment on 08/28/2018 , will hold treatment on 08/14/2018 to allow for  resolution of her biliary issues and mild pancreatitis.   All of the patients questions were answered with apparent satisfaction. The patient knows to call the clinic with any problems, questions or concerns.  The total time spent in the appt was 25 minutes and more than 50% was on counseling and direct patient cares.   Sullivan Lone MD [place] AAHIVMS [facility] Hematology/Oncology Physician [facility]  (Office):       [phone] (Work cell):  [phone] (Fax):           [phone]  I, Baldwin [place], am acting as a scribe for Dr. Sullivan Lone.   .I have reviewed the above documentation for accuracy and completeness, and I agree with the above. Brunetta Genera MD

## 2018-07-31 ENCOUNTER — Inpatient Hospital Stay: Payer: Medicare Other

## 2018-07-31 ENCOUNTER — Other Ambulatory Visit: Payer: Self-pay

## 2018-07-31 ENCOUNTER — Telehealth: Payer: Self-pay | Admitting: Hematology

## 2018-07-31 ENCOUNTER — Inpatient Hospital Stay: Payer: Medicare Other | Attending: Hematology

## 2018-07-31 ENCOUNTER — Other Ambulatory Visit: Payer: Self-pay | Admitting: *Deleted

## 2018-07-31 ENCOUNTER — Inpatient Hospital Stay (HOSPITAL_BASED_OUTPATIENT_CLINIC_OR_DEPARTMENT_OTHER): Payer: Medicare Other | Admitting: Hematology

## 2018-07-31 VITALS — BP 136/69 | HR 72 | Temp 98.9°F | Resp 18 | Ht 59.0 in | Wt 130.8 lb

## 2018-07-31 DIAGNOSIS — C642 Malignant neoplasm of left kidney, except renal pelvis: Secondary | ICD-10-CM | POA: Insufficient documentation

## 2018-07-31 DIAGNOSIS — E871 Hypo-osmolality and hyponatremia: Secondary | ICD-10-CM | POA: Insufficient documentation

## 2018-07-31 DIAGNOSIS — Z79899 Other long term (current) drug therapy: Secondary | ICD-10-CM | POA: Insufficient documentation

## 2018-07-31 DIAGNOSIS — M1990 Unspecified osteoarthritis, unspecified site: Secondary | ICD-10-CM | POA: Diagnosis not present

## 2018-07-31 DIAGNOSIS — K769 Liver disease, unspecified: Secondary | ICD-10-CM | POA: Insufficient documentation

## 2018-07-31 DIAGNOSIS — I2510 Atherosclerotic heart disease of native coronary artery without angina pectoris: Secondary | ICD-10-CM | POA: Insufficient documentation

## 2018-07-31 DIAGNOSIS — K649 Unspecified hemorrhoids: Secondary | ICD-10-CM | POA: Diagnosis not present

## 2018-07-31 DIAGNOSIS — C7972 Secondary malignant neoplasm of left adrenal gland: Secondary | ICD-10-CM | POA: Insufficient documentation

## 2018-07-31 DIAGNOSIS — C7971 Secondary malignant neoplasm of right adrenal gland: Secondary | ICD-10-CM | POA: Insufficient documentation

## 2018-07-31 DIAGNOSIS — R945 Abnormal results of liver function studies: Secondary | ICD-10-CM

## 2018-07-31 DIAGNOSIS — Z95828 Presence of other vascular implants and grafts: Secondary | ICD-10-CM

## 2018-07-31 DIAGNOSIS — I252 Old myocardial infarction: Secondary | ICD-10-CM | POA: Diagnosis not present

## 2018-07-31 DIAGNOSIS — K5900 Constipation, unspecified: Secondary | ICD-10-CM | POA: Insufficient documentation

## 2018-07-31 DIAGNOSIS — R109 Unspecified abdominal pain: Secondary | ICD-10-CM | POA: Diagnosis not present

## 2018-07-31 DIAGNOSIS — E119 Type 2 diabetes mellitus without complications: Secondary | ICD-10-CM

## 2018-07-31 DIAGNOSIS — R7989 Other specified abnormal findings of blood chemistry: Secondary | ICD-10-CM

## 2018-07-31 DIAGNOSIS — C7951 Secondary malignant neoplasm of bone: Secondary | ICD-10-CM | POA: Diagnosis not present

## 2018-07-31 DIAGNOSIS — C787 Secondary malignant neoplasm of liver and intrahepatic bile duct: Secondary | ICD-10-CM

## 2018-07-31 DIAGNOSIS — E785 Hyperlipidemia, unspecified: Secondary | ICD-10-CM

## 2018-07-31 DIAGNOSIS — E440 Moderate protein-calorie malnutrition: Secondary | ICD-10-CM | POA: Diagnosis not present

## 2018-07-31 DIAGNOSIS — Z87891 Personal history of nicotine dependence: Secondary | ICD-10-CM | POA: Diagnosis not present

## 2018-07-31 DIAGNOSIS — E079 Disorder of thyroid, unspecified: Secondary | ICD-10-CM

## 2018-07-31 DIAGNOSIS — E538 Deficiency of other specified B group vitamins: Secondary | ICD-10-CM

## 2018-07-31 DIAGNOSIS — R1013 Epigastric pain: Secondary | ICD-10-CM

## 2018-07-31 DIAGNOSIS — I10 Essential (primary) hypertension: Secondary | ICD-10-CM

## 2018-07-31 DIAGNOSIS — E875 Hyperkalemia: Secondary | ICD-10-CM | POA: Insufficient documentation

## 2018-07-31 DIAGNOSIS — R110 Nausea: Secondary | ICD-10-CM | POA: Insufficient documentation

## 2018-07-31 DIAGNOSIS — K449 Diaphragmatic hernia without obstruction or gangrene: Secondary | ICD-10-CM | POA: Diagnosis not present

## 2018-07-31 LAB — CBC WITH DIFFERENTIAL/PLATELET
Abs Immature Granulocytes: 0.08 10*3/uL — ABNORMAL HIGH (ref 0.00–0.07)
Basophils Absolute: 0 10*3/uL (ref 0.0–0.1)
Basophils Relative: 0 %
Eosinophils Absolute: 0.2 10*3/uL (ref 0.0–0.5)
Eosinophils Relative: 2 %
HCT: 37.3 % (ref 36.0–46.0)
Hemoglobin: 11.8 g/dL — ABNORMAL LOW (ref 12.0–15.0)
Immature Granulocytes: 1 %
Lymphocytes Relative: 11 %
Lymphs Abs: 1.1 10*3/uL (ref 0.7–4.0)
MCH: 30.2 pg (ref 26.0–34.0)
MCHC: 31.6 g/dL (ref 30.0–36.0)
MCV: 95.4 fL (ref 80.0–100.0)
Monocytes Absolute: 1.3 10*3/uL — ABNORMAL HIGH (ref 0.1–1.0)
Monocytes Relative: 13 %
Neutro Abs: 7.1 10*3/uL (ref 1.7–7.7)
Neutrophils Relative %: 73 %
Platelets: 256 10*3/uL (ref 150–400)
RBC: 3.91 MIL/uL (ref 3.87–5.11)
RDW: 13.5 % (ref 11.5–15.5)
WBC: 9.8 10*3/uL (ref 4.0–10.5)
nRBC: 0 % (ref 0.0–0.2)

## 2018-07-31 LAB — CMP (CANCER CENTER ONLY)
ALT: 110 U/L — ABNORMAL HIGH (ref 0–44)
AST: 84 U/L — ABNORMAL HIGH (ref 15–41)
Albumin: 3.6 g/dL (ref 3.5–5.0)
Alkaline Phosphatase: 87 U/L (ref 38–126)
Anion gap: 10 (ref 5–15)
BUN: 16 mg/dL (ref 8–23)
CO2: 20 mmol/L — ABNORMAL LOW (ref 22–32)
Calcium: 7.8 mg/dL — ABNORMAL LOW (ref 8.9–10.3)
Chloride: 104 mmol/L (ref 98–111)
Creatinine: 1.45 mg/dL — ABNORMAL HIGH (ref 0.44–1.00)
GFR, Est AFR Am: 40 mL/min — ABNORMAL LOW (ref >60)
GFR, Estimated: 35 mL/min — ABNORMAL LOW (ref >60)
Glucose, Bld: 191 mg/dL — ABNORMAL HIGH (ref 70–99)
Potassium: 5.3 mmol/L — ABNORMAL HIGH (ref 3.5–5.1)
Sodium: 134 mmol/L — ABNORMAL LOW (ref 135–145)
Total Bilirubin: 0.4 mg/dL (ref 0.3–1.2)
Total Protein: 6.6 g/dL (ref 6.5–8.1)

## 2018-07-31 LAB — LIPASE, BLOOD: Lipase: 56 U/L — ABNORMAL HIGH (ref 11–51)

## 2018-07-31 LAB — AMYLASE: Amylase: 109 U/L — ABNORMAL HIGH (ref 28–100)

## 2018-07-31 MED ORDER — OXYCODONE-ACETAMINOPHEN 5-325 MG PO TABS
1.0000 | ORAL_TABLET | Freq: Once | ORAL | Status: AC
  Administered 2018-07-31: 1 via ORAL

## 2018-07-31 MED ORDER — OXYCODONE-ACETAMINOPHEN 5-325 MG PO TABS: 1.0000 | ORAL_TABLET | Freq: Once | ORAL | Status: DC

## 2018-07-31 MED ORDER — SODIUM CHLORIDE 0.9% FLUSH
10.0000 mL | Freq: Once | INTRAVENOUS | Status: AC
  Administered 2018-07-31: 10 mL
  Filled 2018-07-31: qty 10

## 2018-07-31 MED ORDER — SENNOSIDES-DOCUSATE SODIUM 8.6-50 MG PO TABS: 2.0000 | ORAL_TABLET | Freq: Every day | ORAL | 1 refills | Status: DC

## 2018-07-31 MED ORDER — OXYCODONE HCL 5 MG PO TABS: 5.0000 mg | ORAL_TABLET | Freq: Four times a day (QID) | ORAL | 0 refills | Status: DC | PRN

## 2018-07-31 MED ORDER — DENOSUMAB 120 MG/1.7ML ~~LOC~~ SOLN: 120.0000 mg | Freq: Once | SUBCUTANEOUS | Status: DC

## 2018-07-31 MED ORDER — HEPARIN SOD (PORK) LOCK FLUSH 100 UNIT/ML IV SOLN
500.0000 [IU] | Freq: Once | INTRAVENOUS | Status: AC
  Administered 2018-07-31: 500 [IU]
  Filled 2018-07-31: qty 5

## 2018-07-31 MED ORDER — OXYCODONE-ACETAMINOPHEN 5-325 MG PO TABS
ORAL_TABLET | ORAL | Status: AC
  Filled 2018-07-31: qty 1

## 2018-07-31 NOTE — Telephone Encounter (Signed)
No los per 4/10. °

## 2018-07-31 NOTE — Patient Instructions (Signed)

## 2018-07-31 NOTE — Progress Notes (Signed)
Due to elevated LFT's patient not getting Opdivo today per [name]. Due to low calcium level patient is not getting Xgeva. Patient is aware that she has a prescription for Oxy to be picked up and if the pain gets worse to go to the ED otherwise follow up with GI on Monday per [name].

## 2018-08-03 ENCOUNTER — Telehealth: Payer: Self-pay | Admitting: Gastroenterology

## 2018-08-03 NOTE — Telephone Encounter (Signed)
Pt states she was in a lot of pain Friday, sat and sun. Pt could not get her chemo due to her elevated LFT's Friday. [name] drew labs and prescribed oxycodone for her and wanted her to notify Dr. Rush Landmark. Pt would like Dr. Rush Landmark to review the labs. States the pain is better today she is tender to touch on her right side. Please advise.

## 2018-08-03 NOTE — Telephone Encounter (Signed)
Pt called and wanted to speak with the nurse. She stated that she is having continuous pain and need advice. She aslo advised that she has been speaking with the nurse  and the nurse knows what is going on.

## 2018-08-04 ENCOUNTER — Other Ambulatory Visit: Payer: Self-pay

## 2018-08-04 ENCOUNTER — Ambulatory Visit (INDEPENDENT_AMBULATORY_CARE_PROVIDER_SITE_OTHER): Payer: Medicare Other | Admitting: Gastroenterology

## 2018-08-04 ENCOUNTER — Encounter: Payer: Self-pay | Admitting: Gastroenterology

## 2018-08-04 VITALS — Ht 59.0 in | Wt 130.0 lb

## 2018-08-04 DIAGNOSIS — R945 Abnormal results of liver function studies: Secondary | ICD-10-CM | POA: Diagnosis not present

## 2018-08-04 DIAGNOSIS — K838 Other specified diseases of biliary tract: Secondary | ICD-10-CM

## 2018-08-04 DIAGNOSIS — R7989 Other specified abnormal findings of blood chemistry: Secondary | ICD-10-CM

## 2018-08-04 DIAGNOSIS — Z85528 Personal history of other malignant neoplasm of kidney: Secondary | ICD-10-CM

## 2018-08-04 DIAGNOSIS — Z9889 Other specified postprocedural states: Secondary | ICD-10-CM | POA: Diagnosis not present

## 2018-08-04 DIAGNOSIS — K8050 Calculus of bile duct without cholangitis or cholecystitis without obstruction: Secondary | ICD-10-CM

## 2018-08-04 NOTE — Progress Notes (Signed)
GASTROENTEROLOGY TELEMEDICINE OUTPATIENT CLINIC VISIT   Primary Care Provider [name], MD [address] [place] [address] [phone]  Patient Profile: Audrey [name] is a 78 y.o. female with a pmh significant for RCC (s/p Nephrectomy on Chemo), DM, GERD, HTN, HLD, CAD, OA, Symptomatic Gallstone Disease (manifested Choledocholithiasis s/p ERCP).  The patient presents to the [facility] for an evaluation and management of problem(s) noted below:  Problem List 1. Abnormal LFTs   2. Choledocholithiasis   3. Dilated bile duct   4. History of ERCP   5. History of renal cell carcinoma     History of Present Illness: Due to the COVID-19 Pandemic, this service was provided via telemedicine using Audio/Visual Media. The patient was located at home. The provider was located in the office. The patient did consent to this visit and is aware of charges through their insurance. Other persons participating in this telemedicine service were none. Time spent on visit was 25 minutes.  She has a history of previous symptomatic gallstone disease.  Such that she had been evaluated by Dr. Barry Dienes at one point in time and 2018/2019 for consideration of cholecystectomy however, she was felt to be in an adequate candidate for surgery at that time due to her underlying CAD.  I met this patient earlier this month for a direct 2 procedure ERCP after she was found to have abnormal liver biochemical testing as well as imaging via ultrasound that suggested a dilated biliary tree as well as choledocholithiasis.  At time of ERCP we found multiple stones and a slight caliber change in the distal duct however all stones were removed successfully based on cholangiography.  I did not leave a stent in the bile duct because flow looked to be good at the end of the procedure.  The patient states for the next 5 days after her procedure she felt great without any recurrence of pain and she  was increasing her level of energy overall.  With that being said, unfortunately she then began to develop symptoms 5 days later of recurrent abdominal discomfort.  She reached out to our group and after a discussion with my nurse and some changes in her bowel regimen she actually had good clinical improvement and we had last spoken on the phone.  However, the patient had worsening of symptoms once again and followed up with her oncologist who performed laboratories as outlined below that showed a slight increase in her transferase's but overall improvement in her alkaline phosphatase and still normal bilirubin.  She also had an amylase and lipase that were sent that showed slight elevations but did not fit criteria for pancreatitis.  She has continued to have similar discomfort in her mid abdomen and right upper quadrant as she did prior to her last ERCP.  At time of her ERCP we also initiated her on PPI therapy as result of finding of gastric erosions and biopsies were negative for H. pylori.  Patient has some very minimal nausea but has not vomited.  She received pain medications from her oncologist for which she is taking oxycodone currently.  She is not taking any other nonsteroidals or BC/Goody powders.  She denies any fevers or chills.  She denies any jaundice.  GI Review of Systems Positive as above Negative for pyrosis, dysphagia, odynophagia, change in bowel habits, melena, hematochezia  Review of Systems General: Denies fevers/chills/weight loss Cardiovascular: Denies chest pain/palpitations Pulmonary: Denies shortness of breath Gastroenterological: See HPI Genitourinary: Denies  darkened urine Hematological: Denies easy bruising Dermatological: Denies jaundice Psychological: Mood is stable   Medications Current Outpatient Medications  Medication Sig Dispense Refill   acetaminophen (TYLENOL) 500 MG tablet Take 1,000 mg by mouth 2 (two) times daily.      amLODipine (NORVASC) 5 MG  tablet Take 5 mg by mouth daily.  6   atorvastatin (LIPITOR) 40 MG tablet Take 40 mg by mouth daily at 6 PM.      Calcium 600-200 MG-UNIT tablet Take 1 tablet by mouth daily at 12 noon.      carvedilol (COREG) 6.25 MG tablet Take 6.25 mg by mouth 2 (two) times daily.  6   clobetasol ointment (TEMOVATE) 7.86 % Apply 1 application topically 2 (two) times daily as needed (for skin irritation.).      denosumab (XGEVA) 120 MG/1.7ML SOLN injection Inject 120 mg into the skin every 6 (six) weeks.     hydrocortisone (CORTEF) 10 MG tablet Take 5-10 mg by mouth See admin instructions. Take 1 tablet (10 mg) by mouth in the morning & 0.5 tablet (5 mg) by mouth in the evening.     levothyroxine (SYNTHROID, LEVOTHROID) 50 MCG tablet Take 50 mcg by mouth daily before breakfast.      lidocaine-prilocaine (EMLA) cream Apply to affected area once (Patient taking differently: Apply 1 application topically daily as needed (prior to port access). ) 30 g 3   Nivolumab (OPDIVO IV) Inject 1 Dose into the vein every 14 (fourteen) days.     omeprazole (PRILOSEC) 20 MG capsule Take 1 capsule (20 mg total) by mouth 2 (two) times daily. 60 capsule 2   oxyCODONE (OXY IR/ROXICODONE) 5 MG immediate release tablet Take 1 tablet (5 mg total) by mouth every 6 (six) hours as needed for severe pain. 30 tablet 0   senna-docusate (SENNA S) 8.6-50 MG tablet Take 2 tablets by mouth at bedtime. 60 tablet 1   No current facility-administered medications for this visit.     Allergies Allergies  Allergen Reactions   Adhesive [Tape] Other (See Comments)    Tears skin off Paper tape is ok per patient   Nsaids Other (See Comments)    Liver damage   Statins Other (See Comments)    Liver/kidney issues.    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, diet controlled   Elevated  liver enzymes    GERD (gastroesophageal reflux disease)    occasional - diet controlled   History of hiatal hernia    HTN (hypertension)    Hyperlipidemia    Hypothyroidism    Myocardial infarction ([place]) 1991   no deficits   SVD (spontaneous vaginal delivery)    x 3   Wears glasses    Past Surgical History:  Procedure Laterality Date   BALLOON DILATION N/A 07/23/2018   Procedure: BALLOON DILATION;  Surgeon: Irving Copas., MD;  Location: [facility];  Service: Gastroenterology;  Laterality: N/A;   BIOPSY  07/23/2018   Procedure: BIOPSY;  Surgeon: Rush Landmark Telford Nab., MD;  Location: [facility];  Service: Gastroenterology;;   COLONOSCOPY     normal    ERCP N/A 07/23/2018   Procedure: ENDOSCOPIC RETROGRADE CHOLANGIOPANCREATOGRAPHY (ERCP);  Surgeon: Irving Copas., MD;  Location: [facility];  Service: Gastroenterology;  Laterality: N/A;   IR IMAGING GUIDED  PORT INSERTION  01/08/2018   LAPAROSCOPIC NEPHRECTOMY Left 08/01/2016   Procedure: LAPAROSCOPIC  RADICAL NEPHRECTOMY/ REPAIR OF UMBILICAL HERNIA;  Surgeon: Raynelle Bring, MD;  Location: [facility];  Service: Urology;  Laterality: Left;   REMOVAL OF STONES  07/23/2018   Procedure: REMOVAL OF STONES;  Surgeon: Mansouraty, Telford Nab., MD;  Location: [facility];  Service: Gastroenterology;;   [name]  07/23/2018   Procedure: [name];  Surgeon: Mansouraty, Telford Nab., MD;  Location[facility];  Service: Gastroenterology;;   UPPER GI ENDOSCOPY     x 1   Social History   Socioeconomic History   Marital status: Married    Spouse name: Not on file   Number of children: 3   Years of education: Not on file   Highest education level: Not on file  Occupational History   Occupation: retired  Scientist, product/process development strain: Not on file   Food insecurity:    Worry: Not on file    Inability: Not on file   Transportation needs:    Medical: Not on file    Non-medical: Not  on file  Tobacco Use   Smoking status: Former Smoker    Packs/day: 1.00    Years: 8.00    Pack years: 8.00    Types: Cigarettes    Last attempt to quit: 06/18/1964    Years since quitting: 54.1   Smokeless tobacco: Never Used  Substance and Sexual Activity   Alcohol use: No   Drug use: No   Sexual activity: Not on file  Lifestyle   Physical activity:    Days per week: Not on file    Minutes per session: Not on file   Stress: Not on file  Relationships   Social connections:    Talks on phone: Not on file    Gets together: Not on file    Attends religious service: Not on file    Active member of club or organization: Not on file    Attends meetings of clubs or organizations: Not on file    Relationship status: Not on file   Intimate partner violence:    Fear of current or ex partner: Not on file    Emotionally abused: Not on file    Physically abused: Not on file    Forced sexual activity: Not on file  Other Topics Concern   Not on file  Social History Narrative   Not on file   Family History  Problem Relation Age of Onset   Colon cancer Neg Hx    Esophageal cancer Neg Hx    Inflammatory bowel disease Neg Hx    Liver disease Neg Hx    Pancreatic cancer Neg Hx    Rectal cancer Neg Hx    Stomach cancer Neg Hx    I have reviewed her medical, social, and family history in detail and updated the electronic medical record as necessary.    PHYSICAL EXAMINATION  Telehealth visit On exam via audio/visual media the patient does not show any evidence of overt jaundice or scleral icterus when she holds her phone up to her eyes She shows tenderness to palpation in the midepigastric and right upper quadrant region without rebound when she presses on her abdomen but with volitional guarding present   REVIEW OF DATA  I reviewed the following data at the time of this encounter:  GI Procedures and Studies  April 2020 ERCP - Mild erosive gastritis. Biopsies  were taken with a cold forceps  for histology on the greater curvature of the stomach, on the lesser curvature of the stomach, at the incisura and in the gastric antrum. - No other gross lesions in the duodenal bulb, in the first portion of the duodenum and in the second portion of the duodenum. - The major papilla appeared to be bulging and patulous. - Filling defects consistent with stones v sludge were seen on the cholangiogram. - The upper third of the main bile duct and middle third of the main bile duct were moderately dilated. - Choledocholithiasis was found. Complete removal was accomplished by biliary sphincterotomy, balloon dilation of the CBD and balloon trawl.  Laboratory Studies  Reviewed in epic  Imaging Studies  March 2020 ultrasound abdomen IMPRESSION: 1. Biliary duct dilatation with 8 mm calculus at the level of the ampulla in the distal common bile duct. 2. Cholelithiasis. No gallbladder wall thickening or pericholecystic fluid. 3. Appearance of the liver raises concern for underlying hepatic cirrhosis. No focal liver lesions are demonstrable. 4.  Left kidney absent. 5.  Small cysts in right kidney. 6.  Aortic Atherosclerosis (ICD10-I70.0).   ASSESSMENT  Ms. Magri is a 79 y.o. female  with a pmh significant for RCC (s/p Nephrectomy on Chemo), DM, GERD, HTN, HLD, CAD, OA, Symptomatic Gallstone Disease (manifested Choledocholithiasis s/p ERCP).  The patient is seen today for evaluation and management of:  1. Abnormal LFTs   2. Choledocholithiasis   3. Dilated bile duct   4. History of ERCP   5. History of renal cell carcinoma    The patient is hemodynamically stable but is having recurrent abdominal discomfort which is reminiscent to her of when she had stone burden in her bile duct treated.  With that being said this does sound like the patient may have recurrent symptomatic gallstone disease.  I had reached out to Dr. Barry Dienes to consider a surgical reconsideration for  cholecystectomy and the finding of her choledocholithiasis.  Unfortunately, the patient has not had an opportunity to be reevaluated by Dr. Barry Dienes as of yet.  I am not convinced this to whether we need to perform a repeat ERCP at this point in time and without having any imaging of her liver over the course of the last 5 months I do think that before we pursue a repeat ERCP with anesthesia that I would like to try and get some further information/data.  We will pursue a MRI/MRCP as well as some liver test that will be repeated later this week.  Her persistence and pain similar to the discomfort she was having before her last ERCP suggest to me that we may likely need to do an ERCP and will look for a possible date next week.  We will try and get the MRI/MRCP done on Thursday or Friday of this week.  We will do it with diffusion imaging and without contrast as a result of her single kidney to minimize exposure/risk.  The risks of an ERCP were discussed at length, including but not limited to the risk of perforation, bleeding, abdominal pain, post-ERCP pancreatitis (while usually mild can be severe and even life threatening).  The risks and benefits of endoscopic evaluation were discussed with the patient; these include but are not limited to the risk of perforation, infection, bleeding, missed lesions, lack of diagnosis, severe illness requiring hospitalization, as well as anesthesia and sedation related illnesses.  The patient is agreeable to proceed if we feel it is necessary.   PLAN  MRI/MRCP with diffusion imaging  and without contrast to be performed later this week to help me understand liver parenchymal tissue abnormality possibility as well as evaluate the bile duct once again Repeat liver tests and pancreatic enzyme evaluation later this week to help me understand and consider the role of repeat ERCP Holding on antibiotics as patient has normal bilirubin and normal alk phos currently and no overt  jaundice or fevers Patient aware that the development of jaundice or fevers would require her to come into the hospital for further evaluation We will consider role of ERCP potentially next week We will update Dr. Barry Dienes as well as am concerned about recurrent symptomatic gallstone disease and need for cholecystectomy   Orders Placed This Encounter  Procedures   MR ABDOMEN MRCP WO CONTRAST   Comp Met (CMET)   Amylase   Lipase   Sedimentation rate   CRP High sensitivity    New Prescriptions   No medications on file   Modified Medications   No medications on file    Planned Follow Up: No follow-ups on file.   Justice Britain, MD [facility] Office # [phone]

## 2018-08-04 NOTE — Telephone Encounter (Signed)
I have reviewed the patient's labs.  Interestingly, her alkaline phosphatase continues to decrease.  The etiology of her persistently elevated liver tests could be the result of stone disease recurring.  I had hoped that with my sphincterotomy that she would not have issues until her gallbladder could be removed.  She remains high risk to repeat ERCP especially in this COVID-19 pandemic.  We may have to consider doing that.  I would like to try and touch base with the patient and if she can be scheduled for a 330 telehealth visit today I will try and talk with her further.  It is not clear to me that the patient has pancreatitis right now based on the labs although she has a slight elevation in her lipase (upper limit of normal 51).  I wonder if she is having recurrent biliary colic as result of her disease.  I will consider ultrasound imaging once again versus a MRI/MRCP for the patient.  But let me talk with her first.  I will also send this notation to [name] as I am not sure what his threshold for liver tests need to be before she can restart her chemotherapy since her bilirubin and alkaline phosphatase are normal.

## 2018-08-04 NOTE — Telephone Encounter (Signed)
Spoke with pt and she is aware of Dr. Donneta Romberg comments. Pt scheduled to have telephone visit today at 3:30pm. Pt knows to be available at that time.

## 2018-08-04 NOTE — Patient Instructions (Addendum)
If you are age 77 or older, your body mass index should be between 23-30. Your Body mass index is 26.26 kg/m. If this is out of the aforementioned range listed, please consider follow up with your Primary Care Provider.  If you are age 13 or younger, your body mass index should be between 19-25. Your Body mass index is 26.26 kg/m. If this is out of the aformentioned range listed, please consider follow up with your Primary Care Provider.   You have been scheduled for an MRI at _______________ on ___________________. Your appointment time is ___________. Please arrive 15 minutes prior to your appointment time for registration purposes. Please make certain not to have anything to eat or drink 6 hours prior to your test. In addition, if you have any metal in your body, have a pacemaker or defibrillator, please be sure to let your ordering physician know. This test typically takes 45 minutes to 1 hour to complete. Should you need to reschedule, please call [phone] to do so.  Your provider has requested that you go to the basement level for lab work on Thursday 08/06/2018. Press "B" on the elevator. The lab is located at the first door on the left as you exit the elevator.     Thank you for choosing me and [facility].  Dr. Rush Landmark

## 2018-08-05 ENCOUNTER — Encounter: Payer: Self-pay | Admitting: Gastroenterology

## 2018-08-05 ENCOUNTER — Other Ambulatory Visit: Payer: Self-pay

## 2018-08-05 DIAGNOSIS — R109 Unspecified abdominal pain: Secondary | ICD-10-CM

## 2018-08-05 DIAGNOSIS — Z85528 Personal history of other malignant neoplasm of kidney: Secondary | ICD-10-CM | POA: Insufficient documentation

## 2018-08-05 DIAGNOSIS — K838 Other specified diseases of biliary tract: Secondary | ICD-10-CM

## 2018-08-05 DIAGNOSIS — R945 Abnormal results of liver function studies: Secondary | ICD-10-CM | POA: Insufficient documentation

## 2018-08-05 DIAGNOSIS — R7989 Other specified abnormal findings of blood chemistry: Secondary | ICD-10-CM | POA: Insufficient documentation

## 2018-08-05 DIAGNOSIS — K8050 Calculus of bile duct without cholangitis or cholecystitis without obstruction: Secondary | ICD-10-CM | POA: Insufficient documentation

## 2018-08-05 DIAGNOSIS — Z9889 Other specified postprocedural states: Secondary | ICD-10-CM | POA: Insufficient documentation

## 2018-08-05 DIAGNOSIS — K8011 Calculus of gallbladder with chronic cholecystitis with obstruction: Secondary | ICD-10-CM

## 2018-08-05 NOTE — Progress Notes (Unsigned)
amn

## 2018-08-06 ENCOUNTER — Other Ambulatory Visit: Payer: Self-pay

## 2018-08-06 ENCOUNTER — Telehealth: Payer: Self-pay | Admitting: Gastroenterology

## 2018-08-06 ENCOUNTER — Ambulatory Visit
Admission: RE | Admit: 2018-08-06 | Discharge: 2018-08-06 | Disposition: A | Discharge status: Home / Self Care | Payer: Medicare Other | Source: Ambulatory Visit | Attending: Gastroenterology | Admitting: Gastroenterology

## 2018-08-06 ENCOUNTER — Other Ambulatory Visit (INDEPENDENT_AMBULATORY_CARE_PROVIDER_SITE_OTHER): Payer: Medicare Other

## 2018-08-06 DIAGNOSIS — K8050 Calculus of bile duct without cholangitis or cholecystitis without obstruction: Secondary | ICD-10-CM

## 2018-08-06 DIAGNOSIS — R945 Abnormal results of liver function studies: Secondary | ICD-10-CM

## 2018-08-06 DIAGNOSIS — Z9889 Other specified postprocedural states: Secondary | ICD-10-CM | POA: Diagnosis not present

## 2018-08-06 DIAGNOSIS — Z85528 Personal history of other malignant neoplasm of kidney: Secondary | ICD-10-CM

## 2018-08-06 DIAGNOSIS — R7989 Other specified abnormal findings of blood chemistry: Secondary | ICD-10-CM

## 2018-08-06 LAB — COMPREHENSIVE METABOLIC PANEL
ALT: 115 U/L — ABNORMAL HIGH (ref 0–35)
AST: 66 U/L — ABNORMAL HIGH (ref 0–37)
Albumin: 4.1 g/dL (ref 3.5–5.2)
Alkaline Phosphatase: 100 U/L (ref 39–117)
BUN: 26 mg/dL — ABNORMAL HIGH (ref 6–23)
CO2: 25 mEq/L (ref 19–32)
Calcium: 8.9 mg/dL (ref 8.4–10.5)
Chloride: 101 mEq/L (ref 96–112)
Creatinine, Ser: 1.64 mg/dL — ABNORMAL HIGH (ref 0.40–1.20)
GFR: 30.43 mL/min — ABNORMAL LOW (ref >60.00)
Glucose, Bld: 167 mg/dL — ABNORMAL HIGH (ref 70–99)
Potassium: 4.6 mEq/L (ref 3.5–5.1)
Sodium: 137 mEq/L (ref 135–145)
Total Bilirubin: 0.5 mg/dL (ref 0.2–1.2)
Total Protein: 7 g/dL (ref 6.0–8.3)

## 2018-08-06 LAB — AMYLASE: Amylase: 65 U/L (ref 27–131)

## 2018-08-06 LAB — HIGH SENSITIVITY CRP: CRP, High Sensitivity: 15.83 mg/L — ABNORMAL HIGH (ref 0.000–5.000)

## 2018-08-06 LAB — LIPASE: Lipase: 66 U/L — ABNORMAL HIGH (ref 11.0–59.0)

## 2018-08-06 LAB — SEDIMENTATION RATE: Sed Rate: 33 mm/hr — ABNORMAL HIGH (ref 0–30)

## 2018-08-06 NOTE — Telephone Encounter (Signed)
I have had a chance to review the patient's CMP as well as inflammatory markers.  I also had a chance to review the patient's MRI/MRCP.  The patient's CMP suggest persistent elevation in liver biochemical testing and there does seem to be evidence on MRI imaging of a potential stricture.  When I had done her ERCP and based on the imaging findings are to can see on canopy I only thought that there was a caliber change but did proceed with a duct dilation of the distal bile duct.  There is no overt choledocholithiasis.  The patient is still having abdominal discomfort a slightly better from when I talk with her last. I think that based on the patient's persistent abdominal discomfort the liver tests which have not improved and with the findings of a potential biliary stricture which I believe to be most likely inflammatory as a result of her stone disease burden I do think that a repeat ERCP is reasonable.  At time of ERCP I will clear the duct of any sludge or stone burden that may have dropped from the gallbladder though no overt evidence of this was found on the MRI imaging.  I intend to also sample the stricture via biopsies/brushings and subsequently likely place a plastic stent.  Moving forward I do think that the patient will benefit from consideration of gallbladder resection and after talking with Dr. Barry Dienes we agreed to let the patient be evaluated by cardiology as cardiology outside provider had felt that she was high risk for a procedure and that was why things were canceled last year.  The patient understands the reasoning for the repeat ERCP and we will proceed with that next week as scheduled.  If she develops any fevers or chills she knows to call the office or call covering overnight and she may require antibiotics and/or evaluation in the hospital.  The risks of an ERCP were discussed at length, including but not limited to the risk of perforation, bleeding, abdominal pain, post-ERCP  pancreatitis (while usually mild can be severe and even life threatening).   Justice Britain, MD [facility] Office # [phone]

## 2018-08-07 ENCOUNTER — Encounter (HOSPITAL_COMMUNITY): Payer: Self-pay | Admitting: *Deleted

## 2018-08-07 ENCOUNTER — Other Ambulatory Visit: Payer: Self-pay

## 2018-08-07 NOTE — Progress Notes (Signed)
Spoke with patient regarding her pre-op instructions for Monday, 08/10/2018.  Patient denies chest pain, SOB, fever, cough, n/v.  Patient was instructed to stop now taking any Aspirin (unless otherwise instructed by your surgeon), Aleve, Naproxen, Ibuprofen, Motrin, Advil, Goody's, BC's, all herbal medications, fish oil, and all vitamins.   PCP/Cardiologist -  Dr [name]- [facility], [place]  Chest x-ray - 04/25/2018 EKG - 2019 (Dr Lacey Jensen) Stress Test - denies ECHO - 2019 (Dr Lacey Jensen) Cardiac Cath - 1991 (Dr Lacey Jensen)  Sleep Study - denies CPAP - N/A  Diabetic:  Does not check blood sugars, diet controlled, no medications.  Coronavirus Screening  Have you or your son Elberta Fortis experienced the following symptoms:  Cough yes/no: No Fever (>100.26F)  yes/no: No Runny nose yes/no: No Sore throat yes/no: No Difficulty breathing/shortness of breath  yes/no: No  Have you or your son Elberta Fortis traveled in the last 14 days and where? yes/no: No  Patient verbalized understanding of all pre-op instructions.  Patient informed of current Hospital Visitation Restriction Policy that is now in effect.

## 2018-08-10 ENCOUNTER — Ambulatory Visit (HOSPITAL_COMMUNITY): Payer: Medicare Other

## 2018-08-10 ENCOUNTER — Other Ambulatory Visit: Payer: Self-pay

## 2018-08-10 ENCOUNTER — Telehealth: Payer: Self-pay

## 2018-08-10 ENCOUNTER — Ambulatory Visit (HOSPITAL_COMMUNITY)
Admission: RE | Admit: 2018-08-10 | Discharge: 2018-08-10 | Disposition: A | Discharge status: Home / Self Care | Payer: Medicare Other | Attending: Gastroenterology | Admitting: Gastroenterology

## 2018-08-10 ENCOUNTER — Encounter (HOSPITAL_COMMUNITY)
Admission: RE | Disposition: A | Discharge status: Home / Self Care | Payer: Self-pay | Source: Home / Self Care | Attending: Gastroenterology

## 2018-08-10 ENCOUNTER — Encounter (HOSPITAL_COMMUNITY): Payer: Self-pay | Admitting: *Deleted

## 2018-08-10 ENCOUNTER — Ambulatory Visit (HOSPITAL_COMMUNITY): Payer: Medicare Other | Admitting: Certified Registered Nurse Anesthetist

## 2018-08-10 DIAGNOSIS — K831 Obstruction of bile duct: Secondary | ICD-10-CM

## 2018-08-10 DIAGNOSIS — E785 Hyperlipidemia, unspecified: Secondary | ICD-10-CM | POA: Diagnosis not present

## 2018-08-10 DIAGNOSIS — K8031 Calculus of bile duct with cholangitis, unspecified, with obstruction: Secondary | ICD-10-CM | POA: Diagnosis not present

## 2018-08-10 DIAGNOSIS — K219 Gastro-esophageal reflux disease without esophagitis: Secondary | ICD-10-CM | POA: Insufficient documentation

## 2018-08-10 DIAGNOSIS — Z905 Acquired absence of kidney: Secondary | ICD-10-CM | POA: Diagnosis not present

## 2018-08-10 DIAGNOSIS — Z85528 Personal history of other malignant neoplasm of kidney: Secondary | ICD-10-CM | POA: Diagnosis not present

## 2018-08-10 DIAGNOSIS — E119 Type 2 diabetes mellitus without complications: Secondary | ICD-10-CM | POA: Insufficient documentation

## 2018-08-10 DIAGNOSIS — M160 Bilateral primary osteoarthritis of hip: Secondary | ICD-10-CM | POA: Insufficient documentation

## 2018-08-10 DIAGNOSIS — R945 Abnormal results of liver function studies: Secondary | ICD-10-CM

## 2018-08-10 DIAGNOSIS — E039 Hypothyroidism, unspecified: Secondary | ICD-10-CM | POA: Insufficient documentation

## 2018-08-10 DIAGNOSIS — M19042 Primary osteoarthritis, left hand: Secondary | ICD-10-CM | POA: Insufficient documentation

## 2018-08-10 DIAGNOSIS — Z9889 Other specified postprocedural states: Secondary | ICD-10-CM

## 2018-08-10 DIAGNOSIS — Z888 Allergy status to other drugs, medicaments and biological substances status: Secondary | ICD-10-CM | POA: Diagnosis not present

## 2018-08-10 DIAGNOSIS — K449 Diaphragmatic hernia without obstruction or gangrene: Secondary | ICD-10-CM | POA: Insufficient documentation

## 2018-08-10 DIAGNOSIS — K8050 Calculus of bile duct without cholangitis or cholecystitis without obstruction: Secondary | ICD-10-CM

## 2018-08-10 DIAGNOSIS — I10 Essential (primary) hypertension: Secondary | ICD-10-CM | POA: Diagnosis not present

## 2018-08-10 DIAGNOSIS — M19041 Primary osteoarthritis, right hand: Secondary | ICD-10-CM | POA: Diagnosis not present

## 2018-08-10 DIAGNOSIS — I252 Old myocardial infarction: Secondary | ICD-10-CM | POA: Diagnosis not present

## 2018-08-10 DIAGNOSIS — Z87891 Personal history of nicotine dependence: Secondary | ICD-10-CM | POA: Diagnosis not present

## 2018-08-10 DIAGNOSIS — Z9221 Personal history of antineoplastic chemotherapy: Secondary | ICD-10-CM | POA: Diagnosis not present

## 2018-08-10 DIAGNOSIS — K838 Other specified diseases of biliary tract: Secondary | ICD-10-CM

## 2018-08-10 DIAGNOSIS — R109 Unspecified abdominal pain: Secondary | ICD-10-CM

## 2018-08-10 DIAGNOSIS — Z9109 Other allergy status, other than to drugs and biological substances: Secondary | ICD-10-CM | POA: Insufficient documentation

## 2018-08-10 DIAGNOSIS — R7989 Other specified abnormal findings of blood chemistry: Secondary | ICD-10-CM | POA: Diagnosis not present

## 2018-08-10 DIAGNOSIS — M1990 Unspecified osteoarthritis, unspecified site: Secondary | ICD-10-CM | POA: Insufficient documentation

## 2018-08-10 DIAGNOSIS — K8011 Calculus of gallbladder with chronic cholecystitis with obstruction: Secondary | ICD-10-CM

## 2018-08-10 HISTORY — PX: BILIARY DILATION: SHX6850

## 2018-08-10 HISTORY — PX: BILIARY STENT PLACEMENT: SHX5538

## 2018-08-10 HISTORY — PX: REMOVAL OF STONES: SHX5545

## 2018-08-10 HISTORY — PX: ENDOSCOPIC RETROGRADE CHOLANGIOPANCREATOGRAPHY (ERCP) WITH PROPOFOL: SHX5810

## 2018-08-10 HISTORY — PX: BILIARY BRUSHING: SHX6843

## 2018-08-10 LAB — GLUCOSE, CAPILLARY: Glucose-Capillary: 179 mg/dL — ABNORMAL HIGH (ref 70–99)

## 2018-08-10 SURGERY — ENDOSCOPIC RETROGRADE CHOLANGIOPANCREATOGRAPHY (ERCP) WITH PROPOFOL
Anesthesia: General

## 2018-08-10 MED ORDER — GLUCAGON HCL RDNA (DIAGNOSTIC) 1 MG IJ SOLR
INTRAMUSCULAR | Status: DC | PRN
  Administered 2018-08-10 (×2): 0.25 mg via INTRAVENOUS

## 2018-08-10 MED ORDER — LACTATED RINGERS IV SOLN
INTRAVENOUS | Status: DC | PRN
  Administered 2018-08-10 (×2): via INTRAVENOUS

## 2018-08-10 MED ORDER — SODIUM CHLORIDE 0.9 % IV SOLN
INTRAVENOUS | Status: DC | PRN
  Administered 2018-08-10: 80 ug/min via INTRAVENOUS

## 2018-08-10 MED ORDER — ROCURONIUM BROMIDE 10 MG/ML (PF) SYRINGE
PREFILLED_SYRINGE | INTRAVENOUS | Status: DC | PRN
  Administered 2018-08-10: 50 mg via INTRAVENOUS

## 2018-08-10 MED ORDER — CIPROFLOXACIN IN D5W 400 MG/200ML IV SOLN
INTRAVENOUS | Status: AC
  Filled 2018-08-10: qty 200

## 2018-08-10 MED ORDER — ESMOLOL HCL 100 MG/10ML IV SOLN
INTRAVENOUS | Status: DC | PRN
  Administered 2018-08-10: 20 mg via INTRAVENOUS

## 2018-08-10 MED ORDER — CIPROFLOXACIN HCL 500 MG PO TABS: 500.0000 mg | ORAL_TABLET | Freq: Two times a day (BID) | ORAL | 0 refills | Status: AC

## 2018-08-10 MED ORDER — SODIUM CHLORIDE 0.9 % IV SOLN: INTRAVENOUS | Status: DC

## 2018-08-10 MED ORDER — PROPOFOL 10 MG/ML IV BOLUS
INTRAVENOUS | Status: DC | PRN
  Administered 2018-08-10: 30 mg via INTRAVENOUS
  Administered 2018-08-10: 70 mg via INTRAVENOUS

## 2018-08-10 MED ORDER — LIDOCAINE 2% (20 MG/ML) 5 ML SYRINGE
INTRAMUSCULAR | Status: DC | PRN
  Administered 2018-08-10: 30 mg via INTRAVENOUS
  Administered 2018-08-10: 70 mg via INTRAVENOUS

## 2018-08-10 MED ORDER — SUGAMMADEX SODIUM 200 MG/2ML IV SOLN
INTRAVENOUS | Status: DC | PRN
  Administered 2018-08-10: 200 mg via INTRAVENOUS

## 2018-08-10 MED ORDER — ONDANSETRON HCL 4 MG/2ML IJ SOLN
INTRAMUSCULAR | Status: DC | PRN
  Administered 2018-08-10: 4 mg via INTRAVENOUS

## 2018-08-10 MED ORDER — CIPROFLOXACIN IN D5W 400 MG/200ML IV SOLN
400.0000 mg | Freq: Once | INTRAVENOUS | Status: AC
  Administered 2018-08-10: 12:00:00 400 mg via INTRAVENOUS

## 2018-08-10 MED ORDER — GLUCAGON HCL RDNA (DIAGNOSTIC) 1 MG IJ SOLR
INTRAMUSCULAR | Status: AC
  Filled 2018-08-10: qty 1

## 2018-08-10 MED ORDER — INDOMETHACIN 50 MG RE SUPP
RECTAL | Status: AC
  Filled 2018-08-10: qty 2

## 2018-08-10 MED ORDER — INDOMETHACIN 50 MG RE SUPP
RECTAL | Status: DC | PRN
  Administered 2018-08-10: 100 mg via RECTAL

## 2018-08-10 MED ORDER — SODIUM CHLORIDE 0.9 % IV SOLN
INTRAVENOUS | Status: DC | PRN
  Administered 2018-08-10: 20 mL
  Administered 2018-08-10: 50 mL

## 2018-08-10 MED ORDER — DEXAMETHASONE SODIUM PHOSPHATE 10 MG/ML IJ SOLN
INTRAMUSCULAR | Status: DC | PRN
  Administered 2018-08-10: 4 mg via INTRAVENOUS

## 2018-08-10 MED ORDER — FENTANYL CITRATE (PF) 100 MCG/2ML IJ SOLN: 25.0000 ug | INTRAMUSCULAR | Status: DC | PRN

## 2018-08-10 NOTE — Addendum Note (Signed)
Addendum  created 08/10/18 1616 by Annye Asa, MD   Clinical Note Signed

## 2018-08-10 NOTE — Op Note (Signed)
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 Estimated Blood Loss:     Estimated blood loss was minimal. Procedure:                Pre-Anesthesia Assessment:                           - Prior to the procedure, a History and Physical                            was performed, and patient medications and                            allergies were reviewed. The patient's tolerance of                            previous anesthesia was also reviewed. The risks                            and benefits of the procedure and the sedation                            options and risks were  discussed with the patient.                            All questions were answered, and informed consent                            was obtained. Prior Anticoagulants: The patient has                            taken no previous anticoagulant or antiplatelet                            agents. ASA Grade Assessment: III - A patient with                            severe systemic  disease. After reviewing the risks                            and benefits, the patient was deemed in                            satisfactory condition to undergo the procedure.                           After obtaining informed consent, the scope was                            passed under direct vision. Throughout the                            procedure, the patient's blood pressure, pulse, and                            oxygen saturations were monitored continuously. The                            [id number]-[id number] ([id number]) Olympus Duodensocope was                            introduced through the mouth, and used to inject                            contrast into and used to inject contrast into the                            bile duct. The ERCP was accomplished without                            difficulty. The patient tolerated the procedure. Scope In: Scope Out: Findings:      The scout film was normal.      The esophagus was successfully intubated under direct vision without       detailed examination of the pharynx, larynx, and associated structures,       and upper GI tract.  A biliary sphincterotomy had been performed. The       sphincterotomy appeared open.      A short 0.035 inch Soft Jagwire was passed into the biliary tree. The       Jagtome sphincterotome was passed over the guidewire and the bile duct       was then deeply cannulated. Contrast was injected. I personally       interpreted the bile duct images. Ductal flow of contrast was adequate.       Image quality was adequate. Contrast extended to the entire biliary       tree. Opacification of the entire biliary tree was successful. The lower       third of the main bile duct contained a single localized caliber change,       stenosis, 20 mm in length. The middle third of the main bile duct and       upper third of the main bile duct were mildly dilated. The largest       diameter was 10 mm. Dilation of the distal common  bile duct with an       11-28-08 mm balloon (to a maximum balloon size of 9 mm) dilator was       successful at the region of the stenosis/caliber change. To discover       objects, the biliary tree was swept with a retrieval balloon starting at       the bifurcation. Sludge was swept from the duct. Pus was unexpectedly       swept from the duct as well. An occlusion cholangiogram was performed       that showed no further significant biliary pathology other than what was       noted above. There was evidence of the gallbladder filling. Cells for       cytology were obtained by brushing in the lower third of the main bile       duct at the stenosis/caliber change. Even though flow from the bile duct       was noted without a stent in place, with the findings of cholangitis as       well as the caliber change/stenosis still be present, decision was made       for stent placement into the biliary tree. One 10 Fr by 5 cm plastic       biliary stent with a single external flap and a single internal flap was       placed into the common bile duct. Bile flowed through the stent. The       stent was in good position.      A pancreatogram was not performed.      The duodenoscope was withdrawn from the patient. Impression:               - Prior biliary sphincterotomy appeared open.                           - The upper third of the main bile duct and middle                            third of the main bile duct were mildly dilated.                           - A single localized  biliary stenosis/caliber                            change was found in the lower third of the main                            bile duct. The stricture was indeterminate. This                            was dilated and then brushed for cytology.                           - The biliary tree was swept and sludge and                            pus/cholangitis were found.                           - To ensure proper drainage in  setting of findings                            of cholangitis and persistently elevated LFTs, one                            plastic biliary stent was placed into the common                            bile duct. Recommendation:           - The patient will be observed post-procedure,                            until all discharge criteria are met.                           - Discharge patient to home.                           - Patient has a contact number available for                            emergencies. The signs and symptoms of potential                            delayed complications were discussed with the                            patient. Return to normal activities tomorrow.                            Written discharge instructions were provided to the                            patient.                           -  Check liver enzymes (AST, ALT, alkaline                            phosphatase, bilirubin) in 1-2 weeks.                           - Observe patient's clinical course.                           - Watch for pancreatitis, bleeding, perforation,                            and cholangitis.                           - 5-days of Ciprofloxacin BID.                           - Follow up pathology.                           - Repeat ERCP in 2-3 months to exchange stent and                            further dilate with other stent placement if                            negative cytology.                           - If positive cytology then will require further                            workup/evaluation to consider role of EUS unless                            diagnostic of malignancy.                           - Minimize NSAID use as able for next 1-week.                           - If negative cytology, will still need strong                            consideration for possible cholecystectomy, though                            understandably with a new biliary  stenosis which we                            hope is inflammatory driven rather than malignancy                            driven, we need to monitor her status. If she  develops further issues may require percutaneous                            cholecystostomy tube vs consideration of                            cholecystoenterostomy. Awaiting Cardiology                            consultation as outpatient to further delineate her                            candidacy for surgery.                           - The findings and recommendations were discussed                            with the patient.                           - The findings and recommendations were discussed                            with the patient's family. Procedure Code(s):        --- Professional ---                           [phone], Endoscopic retrograde                            cholangiopancreatography (ERCP); with placement of                            endoscopic stent into biliary or pancreatic duct,                            including pre- and post-dilation and guide wire                            passage, when performed, including sphincterotomy,                            when performed, each stent                           [id number], Endoscopic retrograde                            cholangiopancreatography (ERCP); with removal of                            calculi/debris from biliary/pancreatic duct(s) Diagnosis Code(s):        --- Professional ---                           K83.1, Obstruction of bile duct  R10.13, Epigastric pain                           R94.5, Abnormal results of liver function studies                           K83.8, Other specified diseases of biliary tract CPT copyright 2019 American Medical Association. All rights reserved. The codes documented in this report are preliminary and upon coder review may  be revised to meet current  compliance requirements. Justice Britain, MD 08/10/2018 2:03:31 PM Number of Addenda: 0

## 2018-08-10 NOTE — Discharge Instructions (Signed)
YOU HAD AN ENDOSCOPIC PROCEDURE TODAY: Refer to the procedure report and other information in the discharge instructions given to you for any specific questions about what was found during the examination. If this information does not answer your questions, please call [place] office at [phone] to clarify.   YOU SHOULD EXPECT: Some feelings of bloating in the abdomen. Passage of more gas than usual. Walking can help get rid of the air that was put into your GI tract during the procedure and reduce the bloating. If you had a lower endoscopy (such as a colonoscopy or flexible sigmoidoscopy) you may notice spotting of blood in your stool or on the toilet paper. Some abdominal soreness may be present for a day or two, also.  DIET: Your first meal following the procedure should be a light meal and then it is ok to progress to your normal diet. A half-sandwich or bowl of soup is an example of a good first meal. Heavy or fried foods are harder to digest and may make you feel nauseous or bloated. Drink plenty of fluids but you should avoid alcoholic beverages for 24 hours. If you had a esophageal dilation, please see attached instructions for diet.    ACTIVITY: Your care partner should take you home directly after the procedure. You should plan to take it easy, moving slowly for the rest of the day. You can resume normal activity the day after the procedure however YOU SHOULD NOT DRIVE, use power tools, machinery or perform tasks that involve climbing or major physical exertion for 24 hours (because of the sedation medicines used during the test).   SYMPTOMS TO REPORT IMMEDIATELY: A gastroenterologist can be reached at any hour. Please call [phone]  for any of the following symptoms:   Following upper endoscopy (EGD, EUS, ERCP, esophageal dilation) Vomiting of blood or coffee ground material  New, significant abdominal pain  New, significant chest pain or pain under the shoulder blades  Painful or  persistently difficult swallowing  New shortness of breath  Black, tarry-looking or red, bloody stools  FOLLOW UP:  If any biopsies were taken you will be contacted by phone or by letter within the next 1-3 weeks. Call [phone]  if you have not heard about the biopsies in 3 weeks.  Please also call with any specific questions about appointments or follow up tests.  

## 2018-08-10 NOTE — Telephone Encounter (Signed)
Per [name], appointments on 08/14/18 should be cancelled due to patient issues with gallbladder, liver, and pancreas. [name] stated patient should resume visits as scheduled on 08/28/2018. Spoke to patient and made her aware and she verbalized understanding.

## 2018-08-10 NOTE — H&P (Signed)
GASTROENTEROLOGY OUTPATIENT PROCEDURE H&P NOTE   Primary Care Physician: [name], MD  HPI: Audrey [name] is a 78 y.o. female who presents for ERCP.  Past Medical History:  Diagnosis Date  . Arthritis    lower back, hips, hands  . Cancer [facility])    renal cancer - left kidney removed, pill chemo x 1 yr  . Diabetes mellitus ([place])    type 2 - no meds, diet controlled  . Elevated liver enzymes   . GERD (gastroesophageal reflux disease)    occasional - diet controlled  . History of hiatal hernia   . HTN (hypertension)   . Hyperlipidemia   . Hypothyroidism   . Myocardial infarction ([place]) 1991   no deficits  . SVD (spontaneous vaginal delivery)    x 3  . Wears glasses    Past Surgical History:  Procedure Laterality Date  . BALLOON DILATION N/A 07/23/2018   Procedure: BALLOON DILATION;  Surgeon: Rush Landmark Telford Nab., MD;  Location: [place];  Service: Gastroenterology;  Laterality: N/A;  . BIOPSY  07/23/2018   Procedure: BIOPSY;  Surgeon: Rush Landmark Telford Nab., MD;  Location: [place];  Service: Gastroenterology;;  . COLONOSCOPY     normal   . ERCP N/A 07/23/2018   Procedure: ENDOSCOPIC RETROGRADE CHOLANGIOPANCREATOGRAPHY (ERCP);  Surgeon: Irving Copas., MD;  Location: [place];  Service: Gastroenterology;  Laterality: N/A;  . IR IMAGING GUIDED PORT INSERTION  01/08/2018  . LAPAROSCOPIC NEPHRECTOMY Left 08/01/2016   Procedure: LAPAROSCOPIC  RADICAL NEPHRECTOMY/ REPAIR OF UMBILICAL HERNIA;  Surgeon: Raynelle Bring, MD;  Location: [facility];  Service: Urology;  Laterality: Left;  . REMOVAL OF STONES  07/23/2018   Procedure: REMOVAL OF GALL STONES;  Surgeon: Rush Landmark Telford Nab., MD;  Location: [place];  Service: Gastroenterology;;  . [name]  07/23/2018   Procedure: [name];  Surgeon: Irving Copas., MD;  Location: [place];  Service: Gastroenterology;;  . UPPER GI ENDOSCOPY     x 1   Current Facility-Administered Medications   Medication Dose Route Frequency Provider Last Rate Last Dose  . 0.9 %  sodium chloride infusion   Intravenous Continuous Mansouraty, Telford Nab., MD      . ciprofloxacin (CIPRO) IVPB 400 mg  400 mg Intravenous Once Mansouraty, Telford Nab., MD       Allergies  Allergen Reactions  . Adhesive [Tape] Other (See Comments)    Tears skin off Paper tape is ok per patient  . Nsaids Other (See Comments)    Liver damage  . Statins Other (See Comments)    Liver/kidney issues.   Family History  Problem Relation Age of Onset  . Colon cancer Neg Hx   . Esophageal cancer Neg Hx   . Inflammatory bowel disease Neg Hx   . Liver disease Neg Hx   . Pancreatic cancer Neg Hx   . Rectal cancer Neg Hx   . Stomach cancer Neg Hx    Social History   Socioeconomic History  . Marital status: Married    Spouse name: Not on file  . Number of children: 3  . Years of education: Not on file  . Highest education level: Not on file  Occupational History  . Occupation: retired  Scientific laboratory technician  . Financial resource strain: Not on file  . Food insecurity:    Worry: Not on file    Inability: Not on file  . Transportation needs:    Medical: Not on file    Non-medical: Not on file  Tobacco Use  . Smoking status: Former Smoker    Packs/day: 1.00    Years: 8.00    Pack years: 8.00    Types: Cigarettes    Last attempt to quit: 06/18/1964    Years since quitting: 54.1  . Smokeless tobacco: Never Used  Substance and Sexual Activity  . Alcohol use: No  . Drug use: No  . Sexual activity: Not on file  Lifestyle  . Physical activity:    Days per week: Not on file    Minutes per session: Not on file  . Stress: Not on file  Relationships  . Social connections:    Talks on phone: Not on file    Gets together: Not on file    Attends religious service: Not on file    Active member of club or organization: Not on file    Attends meetings of clubs or organizations: Not on file    Relationship status: Not on  file  . Intimate partner violence:    Fear of current or ex partner: Not on file    Emotionally abused: Not on file    Physically abused: Not on file    Forced sexual activity: Not on file  Other Topics Concern  . Not on file  Social History Narrative  . Not on file    Physical Exam: Vital signs in last 24 hours: Temp:  [98.4 F (36.9 C)] 98.4 F (36.9 C) (04/20 1033) Resp:  [14] 14 (04/20 1033) BP: (143)/(47) 143/47 (04/20 1033) SpO2:  [98 %] 98 % (04/20 1033) Weight:  [59 kg] 59 kg (04/20 1033)   GEN: NAD EYE: Sclerae anicteric ENT: MMM CV: RR without R/Gs  RESP: CTAB posteriorly GI: Soft, TTP NEURO:  Alert & Oriented x 3  Lab Results: No results for input(s): WBC, HGB, HCT, PLT in the last 72 hours. BMET No results for input(s): NA, K, CL, CO2, GLUCOSE, BUN, CREATININE, CALCIUM in the last 72 hours. LFT No results for input(s): PROT, ALBUMIN, AST, ALT, ALKPHOS, BILITOT, BILIDIR, IBILI in the last 72 hours. PT/INR No results for input(s): LABPROT, INR in the last 72 hours.   Impression / Plan: This is a 78 y.o.female who presents for ERCP.  The risks and benefits of endoscopic evaluation were discussed with the patient; these include but are not limited to the risk of perforation, infection, bleeding, missed lesions, lack of diagnosis, severe illness requiring hospitalization, as well as anesthesia and sedation related illnesses.  The patient is agreeable to proceed.   The risks of an ERCP were discussed at length, including but not limited to the risk of perforation, bleeding, abdominal pain, post-ERCP pancreatitis (while usually mild can be severe and even life threatening).   Justice Britain, MD [facility] Office # [phone]

## 2018-08-10 NOTE — Anesthesia Postprocedure Evaluation (Addendum)
Anesthesia Post Note  Patient: Audrey [name]  Procedure(s) Performed: ENDOSCOPIC RETROGRADE CHOLANGIOPANCREATOGRAPHY (ERCP) WITH PROPOFOL (N/A ) BILIARY BRUSHING BILIARY DILATION BILIARY STENT PLACEMENT REMOVAL OF STONES     Patient location during evaluation: Endoscopy Anesthesia Type: General Level of consciousness: awake and alert, patient cooperative and oriented Pain management: pain level controlled Vital Signs Assessment: post-procedure vital signs reviewed and stable Respiratory status: spontaneous breathing, nonlabored ventilation and respiratory function stable Cardiovascular status: blood pressure returned to baseline and stable Postop Assessment: no apparent nausea or vomiting Anesthetic complications: no    Last Vitals:  Vitals:   08/10/18 1355 08/10/18 1405  BP: (!) 145/60 (!) 158/63  Pulse: (!) 57 (!) 57  Resp: 11 11  Temp:    SpO2: 98% 100%    Last Pain:  Vitals:   08/10/18 1405  TempSrc:   PainSc: 0-No pain                 Hulon Ferron,E. Janette Harvie

## 2018-08-10 NOTE — Anesthesia Procedure Notes (Signed)
Procedure Name: Intubation Performed by: Milford Cage, CRNA Pre-anesthesia Checklist: Patient identified, Emergency Drugs available, Suction available and Patient being monitored Patient Re-evaluated:Patient Re-evaluated prior to induction Oxygen Delivery Method: Circle System Utilized Preoxygenation: Pre-oxygenation with 100% oxygen Induction Type: IV induction and Rapid sequence Laryngoscope Size: Miller and 2 Grade View: Grade II Tube type: Oral Tube size: 6.5 mm Number of attempts: 1 Airway Equipment and Method: Stylet and Oral airway Placement Confirmation: ETT inserted through vocal cords under direct vision,  positive ETCO2 and breath sounds checked- equal and bilateral Secured at: 22 cm Tube secured with: Tape Dental Injury: Teeth and Oropharynx as per pre-operative assessment

## 2018-08-10 NOTE — Transfer of Care (Signed)
Immediate Anesthesia Transfer of Care Note  Patient: Audrey [name]  Procedure(s) Performed: ENDOSCOPIC RETROGRADE CHOLANGIOPANCREATOGRAPHY (ERCP) WITH PROPOFOL (N/A ) BIOPSY BILIARY DILATION BILIARY STENT PLACEMENT  Patient Location: Endoscopy Unit  Anesthesia Type:General  Level of Consciousness: awake  Airway & Oxygen Therapy: Patient Spontanous Breathing and Patient connected to face mask oxygen  Post-op Assessment: Report given to RN and Post -op Vital signs reviewed and stable  Post vital signs: Reviewed and stable  Last Vitals:  Vitals Value Taken Time  BP 114/95 08/10/2018  1:36 PM  Temp 36.5 C 08/10/2018  1:36 PM  Pulse 64 08/10/2018  1:40 PM  Resp 14 08/10/2018  1:40 PM  SpO2 98 % 08/10/2018  1:40 PM  Vitals shown include unvalidated device data.  Last Pain:  Vitals:   08/10/18 1336  TempSrc: Axillary  PainSc: 0-No pain         Complications: No apparent anesthesia complications

## 2018-08-10 NOTE — Anesthesia Preprocedure Evaluation (Addendum)
Anesthesia Evaluation  Patient identified by MRN, date of birth, ID band Patient awake    Reviewed: Allergy & Precautions, NPO status , Patient's Chart, lab work & pertinent test results, reviewed documented beta blocker date and time   History of Anesthesia Complications Negative for: history of anesthetic complications  Airway Mallampati: II  TM Distance: >3 FB Neck ROM: Full    Dental  (+) Missing, Dental Advisory Given, Chipped   Pulmonary former smoker,    breath sounds clear to auscultation       Cardiovascular hypertension, Pt. on medications and Pt. on home beta blockers (-) angina+ Past MI   Rhythm:Regular Rate:Normal     Neuro/Psych negative neurological ROS     GI/Hepatic GERD  Medicated and Controlled,Elevated LFTs   Endo/Other  diabetes (diet controlled)Hypothyroidism   Renal/GU Renal InsufficiencyRenal disease (creat 1.64)Renal cell carcinoma     Musculoskeletal  (+) Arthritis ,   Abdominal   Peds  Hematology negative hematology ROS (+)   Anesthesia Other Findings   Reproductive/Obstetrics                            Anesthesia Physical Anesthesia Plan  ASA: III  Anesthesia Plan: General   Post-op Pain Management:    Induction:   PONV Risk Score and Plan: 3 and Ondansetron, Dexamethasone and Treatment may vary due to age or medical condition  Airway Management Planned: Oral ETT  Additional Equipment:   Intra-op Plan:   Post-operative Plan: Extubation in OR  Informed Consent: I have reviewed the patients History and Physical, chart, labs and discussed the procedure including the risks, benefits and alternatives for the proposed anesthesia with the patient or authorized representative who has indicated his/her understanding and acceptance.     Dental advisory given  Plan Discussed with: CRNA and Surgeon  Anesthesia Plan Comments:        Anesthesia  Quick Evaluation

## 2018-08-10 NOTE — Telephone Encounter (Signed)
Labs have been placed for 3-4 weeks. Pt is aware that she will need these labs s/p ERCP.

## 2018-08-11 ENCOUNTER — Ambulatory Visit: Payer: Medicare Other | Admitting: Gastroenterology

## 2018-08-11 ENCOUNTER — Encounter (HOSPITAL_COMMUNITY): Payer: Self-pay | Admitting: Gastroenterology

## 2018-08-13 ENCOUNTER — Telehealth: Payer: Self-pay

## 2018-08-13 NOTE — Telephone Encounter (Signed)
Spoke with pt and she will come the day before virtual visit for labs.   Pt states she has not had a bm in a week. She took 2 dulcolax yesterday along with 2 doses of miralax and had no results. This morning she has taken 2 more dulcolax but has still not had a BM. Pt wants to know what else the doctor may recommend. Please advise.

## 2018-08-13 NOTE — Telephone Encounter (Signed)
-----   Message from Irving Copas., MD sent at 08/13/2018  9:40 AM EDT ----- Regarding: Follow up Labs Patty or Covering RN, This patient has future labs and has a virtual clinic appointment with me next week. Let's try and have her come in for labs 1 day prior to the clinic visit so I can review them with her. Thanks. GM.

## 2018-08-13 NOTE — Telephone Encounter (Signed)
Thanks for update. If no BM after her Dulcolax and Miralax yesterday into today, then she should be given Glycerin suppositories to take in the evening. Would initiate the patient on Colace 100 mg BID as well (she can get over the counter or send Rx). If no BM after suppositories within a day would benefit from attempt at Enema. She had previously described having issues with prolapse of her rectum that she told me one time after an ERCP, so I'm not clear if she has a longer standing history of constipation or not since we have not been focusing on that issue for her. I would consider getting her Linzess 72 mcg samples as well that she can take QOD or QD if necessary as well. Thanks. GM

## 2018-08-13 NOTE — Telephone Encounter (Signed)
Spoke with pt and she is aware of Dr. Donneta Romberg recommendations. Linzess 9mcg samples left up front for pt to pickup to use daily or every other day.

## 2018-08-13 NOTE — Progress Notes (Signed)
Cardiology Office Note   Date:  08/14/2018   ID:  Audrey [name], DOB [birth year], MRN [id number]  PCP:  [name], MD  Cardiologist:   No primary care provider on file.  Chief Complaint  Patient presents with  . Pre-op Exam      History of Present Illness: Audrey [name] is a 78 y.o. female who is referred by Dr. Sabra Heck for evaluation of an abnormal stress perfusion study.  This was done last December apparently as a routine because she has a history of coronary artery disease.  Now is is preop for probable laparoscopic cholecystectomy.  However, it was suggested because of her abnormal stress test that she might need catheterization beforehand.  She was very reluctant to have this because she has only one kidney.  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  She apparently did not have to have angioplasty and has had no problems since then.  The stress test that she had did demonstrate an inferior infarct with a small area of ischemia.  It was suggested that she had a mildly reduced ejection fraction.  She had a follow-up echocardiogram which demonstrated that the EF was actually normal and there was no inferior wall motion abnormality.    The patient is quite active.  She is not been as active because of recent gallbladder problems.  She has had multiple procedures on this but it looks like she is going to have to have this laparoscopically removed.  Still she was out in the yard this weekend doing hard yard work.  With this she denies any chest pressure, neck or arm discomfort.  She denies any palpitations, presyncope or syncope.  She has not had any PND or orthopnea.  I reviewed the records from her primary provider/cardiologist.   Past Medical History:  Diagnosis Date  . Arthritis    lower back, hips, hands  . Cancer [facility])    renal cancer - left kidney removed, pill chemo x 1 yr  . Diabetes mellitus ([place])    type 2 - no meds, diet  controlled  . Elevated liver enzymes   . GERD (gastroesophageal reflux disease)    occasional - diet controlled  . History of hiatal hernia   . HTN (hypertension)   . Hyperlipidemia   . Hypothyroidism   . Myocardial infarction ([place]) 1991   no deficits  . SVD (spontaneous vaginal delivery)    x 3    Past Surgical History:  Procedure Laterality Date  . BALLOON DILATION N/A 07/23/2018   Procedure: BALLOON DILATION;  Surgeon: Rush Landmark Telford Nab., MD;  Location: [place];  Service: Gastroenterology;  Laterality: N/A;  . BILIARY BRUSHING  08/10/2018   Procedure: BILIARY BRUSHING;  Surgeon: Rush Landmark Telford Nab., MD;  Location: [place];  Service: Gastroenterology;;  . BILIARY DILATION  08/10/2018   Procedure: BILIARY DILATION;  Surgeon: Irving Copas., MD;  Location: [place];  Service: Gastroenterology;;  . BILIARY STENT PLACEMENT  08/10/2018   Procedure: BILIARY STENT PLACEMENT;  Surgeon: Irving Copas., MD;  Location: [place];  Service: Gastroenterology;;  . BIOPSY  07/23/2018   Procedure: BIOPSY;  Surgeon: Irving Copas., MD;  Location: [place];  Service: Gastroenterology;;  . COLONOSCOPY     normal   . ENDOSCOPIC RETROGRADE CHOLANGIOPANCREATOGRAPHY (ERCP) WITH PROPOFOL N/A 08/10/2018   Procedure: ENDOSCOPIC RETROGRADE CHOLANGIOPANCREATOGRAPHY (ERCP) WITH PROPOFOL;  Surgeon: Irving Copas., MD;  Location: [place];  Service: Gastroenterology;  Laterality: N/A;  . ERCP N/A 07/23/2018   Procedure: ENDOSCOPIC RETROGRADE CHOLANGIOPANCREATOGRAPHY (ERCP);  Surgeon: Irving Copas., MD;  Location: [place];  Service: Gastroenterology;  Laterality: N/A;  . IR IMAGING GUIDED PORT INSERTION  01/08/2018  . LAPAROSCOPIC NEPHRECTOMY Left 08/01/2016   Procedure: LAPAROSCOPIC  RADICAL NEPHRECTOMY/ REPAIR OF UMBILICAL HERNIA;  Surgeon: Raynelle Bring, MD;  Location: [facility];  Service: Urology;  Laterality: Left;  . REMOVAL OF STONES   07/23/2018   Procedure: REMOVAL OF GALL STONES;  Surgeon: Rush Landmark Telford Nab., MD;  Location: [place];  Service: Gastroenterology;;  . REMOVAL OF STONES  08/10/2018   Procedure: REMOVAL OF STONES;  Surgeon: Irving Copas., MD;  Location: [place];  Service: Gastroenterology;;  . [name]  07/23/2018   Procedure: [name];  Surgeon: Irving Copas., MD;  Location: [place];  Service: Gastroenterology;;  . UPPER GI ENDOSCOPY     x 1     Current Outpatient Medications  Medication Sig Dispense Refill  . acetaminophen (TYLENOL) 500 MG tablet Take 1,000 mg by mouth 2 (two) times daily.     Marland Kitchen amLODipine (NORVASC) 5 MG tablet Take 5 mg by mouth daily.  6  . atorvastatin (LIPITOR) 40 MG tablet Take 40 mg by mouth daily at 6 PM.     . Calcium 600-200 MG-UNIT tablet Take 1 tablet by mouth daily at 12 noon.     . carvedilol (COREG) 6.25 MG tablet Take 6.25 mg by mouth 2 (two) times daily.  6  . ciprofloxacin (CIPRO) 500 MG tablet Take 1 tablet (500 mg total) by mouth 2 (two) times daily for 5 days. 10 tablet 0  . clobetasol ointment (TEMOVATE) 4.88 % Apply 1 application topically 2 (two) times daily as needed (for skin irritation).     Marland Kitchen denosumab (XGEVA) 120 MG/1.7ML SOLN injection Inject 120 mg into the skin every 6 (six) weeks.    . hydrocortisone (CORTEF) 10 MG tablet Take 5-10 mg by mouth See admin instructions. Take 1 tablet (10 mg) by mouth in the morning & 0.5 tablet (5 mg) by mouth in the evening.    Marland Kitchen levothyroxine (SYNTHROID, LEVOTHROID) 50 MCG tablet Take 50 mcg by mouth daily before breakfast.     . lidocaine-prilocaine (EMLA) cream Apply to affected area once (Patient taking differently: Apply 1 application topically daily as needed (prior to port access). ) 30 g 3  . Nivolumab (OPDIVO IV) Inject 1 Dose into the vein every 14 (fourteen) days.    Marland Kitchen omeprazole (PRILOSEC) 20 MG capsule Take 1 capsule (20 mg total) by mouth 2 (two) times daily. 60  capsule 2  . oxyCODONE (OXY IR/ROXICODONE) 5 MG immediate release tablet Take 1 tablet (5 mg total) by mouth every 6 (six) hours as needed for severe pain. 30 tablet 0  . senna-docusate (SENNA S) 8.6-50 MG tablet Take 2 tablets by mouth at bedtime. 60 tablet 1  . traMADol (ULTRAM) 50 MG tablet Take 50 mg by mouth every 6 (six) hours as needed for moderate pain.     No current facility-administered medications for this visit.     Allergies:   Adhesive [tape]; Nsaids; and Statins    Social History:  The patient  reports that she quit smoking about 54 years ago. Her smoking use included cigarettes. She has a 8.00 pack-year smoking history. She has never used smokeless tobacco. She reports that she does not drink alcohol or use drugs.   Family History:  The patient's  family history includes Heart attack (age of onset: 19) in her father; Heart disease (age of onset: 38) in her brother.    ROS:  Please see the history of present illness.   Otherwise, review of systems are positive for none.   All other systems are reviewed and negative.    PHYSICAL EXAM: VS:  BP 104/72   Pulse 76   Ht 4\' 11"  (1.499 m)   Wt 131 lb (59.4 kg)   BMI 26.46 kg/m  , BMI Body mass index is 26.46 kg/m. GENERAL:  Well appearing HEENT:  Pupils equal round and reactive, fundi not visualized, oral mucosa unremarkable NECK:  No jugular venous distention, waveform within normal limits, carotid upstroke brisk and symmetric, no bruits, no thyromegaly LYMPHATICS:  No cervical, inguinal adenopathy LUNGS:  Clear to auscultation bilaterally BACK:  No CVA tenderness CHEST:  Unremarkable HEART:  PMI not displaced or sustained,S1 and S2 within normal limits, no S3, no S4, no clicks, no rubs, no murmurs ABD:  Flat, positive bowel sounds normal in frequency in pitch, no bruits, no rebound, no guarding, no midline pulsatile mass, no hepatomegaly, no splenomegaly EXT:  2 plus pulses throughout, no edema, no cyanosis no clubbing  SKIN:  No rashes no nodules NEURO:  Cranial nerves II through XII grossly intact, motor grossly intact throughout PSYCH:  Cognitively intact, oriented to person place and time    EKG:  EKG is ordered today. The ekg ordered today demonstrates sinus rhythm, rate 76, axis within normal limits, intervals within normal limits, no acute ST-T wave changes.   Recent Labs: 06/19/2018: TSH 4.990 07/31/2018: Hemoglobin 11.8; Platelets 256 08/06/2018: ALT 115; BUN 26; Creatinine, Ser 1.64; Potassium 4.6; Sodium 137    Lipid Panel No results found for: CHOL, TRIG, HDL, CHOLHDL, VLDL, LDLCALC, LDLDIRECT    Wt Readings from Last 3 Encounters:  08/14/18 131 lb (59.4 kg)  08/10/18 130 lb (59 kg)  08/04/18 130 lb (59 kg)      Other studies Reviewed: Additional studies/ records that were reviewed today include: Lexiscan Myoview, echo. Review of the above records demonstrates:  Please see elsewhere in the note.     ASSESSMENT AND PLAN:  Preop: The patient has a low risk perfusion study.  She has no unstable symptoms.  She has a high functional level.  She is going for a procedure that is not considered to be high risk.  Therefore, according to the ACC/AHA guidelines the patient is at acceptable risk for the planned procedure without further cardiovascular testing.  I discussed this at length with her.  Her risk is low by the modified Lee criteria.  Hypertension:    Her blood pressure is well controlled.  She will continue the meds as listed.  Dyslipidemia: I will defer to her primary provider.  I do not have the most recent blood work.     Current medicines are reviewed at length with the patient today.  The patient does not have concerns regarding medicines.  The following changes have been made:  no change  Labs/ tests ordered today include:  No orders of the defined types were placed in this encounter.    Disposition:   FU with me as needed      Signed, Minus Breeding, MD   08/14/2018 3:06 PM    [facility]

## 2018-08-14 ENCOUNTER — Inpatient Hospital Stay: Payer: Medicare Other

## 2018-08-14 ENCOUNTER — Ambulatory Visit (INDEPENDENT_AMBULATORY_CARE_PROVIDER_SITE_OTHER): Payer: Medicare Other | Admitting: Cardiology

## 2018-08-14 ENCOUNTER — Encounter: Payer: Self-pay | Admitting: Cardiology

## 2018-08-14 ENCOUNTER — Inpatient Hospital Stay: Payer: Medicare Other | Admitting: Hematology

## 2018-08-14 ENCOUNTER — Other Ambulatory Visit: Payer: Self-pay

## 2018-08-14 VITALS — BP 104/72 | HR 76 | Ht 59.0 in | Wt 131.0 lb

## 2018-08-14 DIAGNOSIS — I2510 Atherosclerotic heart disease of native coronary artery without angina pectoris: Secondary | ICD-10-CM | POA: Diagnosis not present

## 2018-08-14 DIAGNOSIS — Z0000 Encounter for general adult medical examination without abnormal findings: Secondary | ICD-10-CM | POA: Insufficient documentation

## 2018-08-14 DIAGNOSIS — Z01810 Encounter for preprocedural cardiovascular examination: Secondary | ICD-10-CM

## 2018-08-14 DIAGNOSIS — E785 Hyperlipidemia, unspecified: Secondary | ICD-10-CM | POA: Diagnosis not present

## 2018-08-14 NOTE — Addendum Note (Signed)
Addended by: Vennie Homans on: 08/14/2018 04:48 PM   Modules accepted: Orders

## 2018-08-14 NOTE — Patient Instructions (Signed)
Medication Instructions:  Continue current medications  If you need a refill on your cardiac medications before your next appointment, please call your pharmacy.  Labwork: None ordered   Testing/Procedures: None Ordered   Follow-Up: Your physician recommends that you schedule a follow-up appointment in: As Needed  At [facility], you and your health needs are our priority.  As part of our continuing mission to provide you with exceptional heart care, we have created designated Provider Care Teams.  These Care Teams include your primary Cardiologist (physician) and Advanced Practice Providers (APPs -  Physician Assistants and Nurse Practitioners) who all work together to provide you with the care you need, when you need it.  Thank you for choosing [facility] at [facility]!!

## 2018-08-18 ENCOUNTER — Encounter: Payer: Self-pay | Admitting: Gastroenterology

## 2018-08-18 ENCOUNTER — Other Ambulatory Visit: Payer: Self-pay

## 2018-08-18 ENCOUNTER — Telehealth: Payer: Self-pay | Admitting: Gastroenterology

## 2018-08-18 DIAGNOSIS — K5900 Constipation, unspecified: Secondary | ICD-10-CM

## 2018-08-18 DIAGNOSIS — R109 Unspecified abdominal pain: Secondary | ICD-10-CM

## 2018-08-18 NOTE — Telephone Encounter (Signed)
X ray and labs in Epic pt aware and will pick up samples as well as

## 2018-08-18 NOTE — Telephone Encounter (Signed)
Spoke with patient briefly about the results of her ERCP stricture brushings. It came back as atypical cells. The patient states she has felt unwell since her procedure and is having pain in her midabdomen. She has not had a bowel movement with our prior interventions as of yet. I have asked her to increase Linzess to 145 mcg (take 2 72 mcg pills) at this time to see if that helps. She will take Bisacodyl as well to try and help her. If not will get Bisacodyl suppositories to try. Let's get the patient to come in for her labs on Wednesday as planned and come upstairs and get samples of 145 mcg Linzess and give it to her for 2-weeks worth if possible. We will have staff reach out to patient and let her know the plan. She is also going to get a KUB 2-view performed on the day of her labs being drawn.  This telephone call took place on 4/27 PM.   Justice Britain, MD [facility] Gastroenterology Advanced Endoscopy Office # [phone]

## 2018-08-19 ENCOUNTER — Ambulatory Visit (INDEPENDENT_AMBULATORY_CARE_PROVIDER_SITE_OTHER)
Admission: RE | Admit: 2018-08-19 | Discharge: 2018-08-19 | Disposition: A | Discharge status: Home / Self Care | Payer: Medicare Other | Source: Ambulatory Visit | Attending: Gastroenterology | Admitting: Gastroenterology

## 2018-08-19 ENCOUNTER — Other Ambulatory Visit (INDEPENDENT_AMBULATORY_CARE_PROVIDER_SITE_OTHER): Payer: Medicare Other

## 2018-08-19 ENCOUNTER — Other Ambulatory Visit: Payer: Self-pay

## 2018-08-19 DIAGNOSIS — R945 Abnormal results of liver function studies: Secondary | ICD-10-CM

## 2018-08-19 DIAGNOSIS — Z9889 Other specified postprocedural states: Secondary | ICD-10-CM

## 2018-08-19 DIAGNOSIS — R109 Unspecified abdominal pain: Secondary | ICD-10-CM

## 2018-08-19 DIAGNOSIS — R7989 Other specified abnormal findings of blood chemistry: Secondary | ICD-10-CM

## 2018-08-19 DIAGNOSIS — K8050 Calculus of bile duct without cholangitis or cholecystitis without obstruction: Secondary | ICD-10-CM

## 2018-08-19 DIAGNOSIS — K5900 Constipation, unspecified: Secondary | ICD-10-CM

## 2018-08-19 DIAGNOSIS — K838 Other specified diseases of biliary tract: Secondary | ICD-10-CM

## 2018-08-19 DIAGNOSIS — Z85528 Personal history of other malignant neoplasm of kidney: Secondary | ICD-10-CM

## 2018-08-19 LAB — AMYLASE: Amylase: 79 U/L (ref 27–131)

## 2018-08-19 LAB — COMPREHENSIVE METABOLIC PANEL
ALT: 125 U/L — ABNORMAL HIGH (ref 0–35)
AST: 69 U/L — ABNORMAL HIGH (ref 0–37)
Albumin: 3.8 g/dL (ref 3.5–5.2)
Alkaline Phosphatase: 100 U/L (ref 39–117)
BUN: 23 mg/dL (ref 6–23)
CO2: 27 mEq/L (ref 19–32)
Calcium: 8 mg/dL — ABNORMAL LOW (ref 8.4–10.5)
Chloride: 102 mEq/L (ref 96–112)
Creatinine, Ser: 1.52 mg/dL — ABNORMAL HIGH (ref 0.40–1.20)
GFR: 33.21 mL/min — ABNORMAL LOW (ref >60.00)
Glucose, Bld: 287 mg/dL — ABNORMAL HIGH (ref 70–99)
Potassium: 4.6 mEq/L (ref 3.5–5.1)
Sodium: 136 mEq/L (ref 135–145)
Total Bilirubin: 0.4 mg/dL (ref 0.2–1.2)
Total Protein: 5.9 g/dL — ABNORMAL LOW (ref 6.0–8.3)

## 2018-08-19 LAB — LIPASE: Lipase: 96 U/L — ABNORMAL HIGH (ref 11.0–59.0)

## 2018-08-19 LAB — HIGH SENSITIVITY CRP: CRP, High Sensitivity: 7.51 mg/L — ABNORMAL HIGH (ref 0.000–5.000)

## 2018-08-20 ENCOUNTER — Other Ambulatory Visit: Payer: Self-pay | Admitting: Hematology

## 2018-08-20 ENCOUNTER — Telehealth: Payer: Self-pay | Admitting: *Deleted

## 2018-08-20 ENCOUNTER — Ambulatory Visit: Payer: Medicare Other | Admitting: Gastroenterology

## 2018-08-20 ENCOUNTER — Ambulatory Visit (INDEPENDENT_AMBULATORY_CARE_PROVIDER_SITE_OTHER): Payer: Medicare Other | Admitting: Gastroenterology

## 2018-08-20 DIAGNOSIS — R1010 Upper abdominal pain, unspecified: Secondary | ICD-10-CM

## 2018-08-20 DIAGNOSIS — R945 Abnormal results of liver function studies: Secondary | ICD-10-CM | POA: Diagnosis not present

## 2018-08-20 DIAGNOSIS — K5900 Constipation, unspecified: Secondary | ICD-10-CM

## 2018-08-20 DIAGNOSIS — R7989 Other specified abnormal findings of blood chemistry: Secondary | ICD-10-CM

## 2018-08-20 DIAGNOSIS — Z9889 Other specified postprocedural states: Secondary | ICD-10-CM | POA: Diagnosis not present

## 2018-08-20 DIAGNOSIS — K831 Obstruction of bile duct: Secondary | ICD-10-CM

## 2018-08-20 MED ORDER — OXYCODONE HCL 5 MG PO TABS: 5.0000 mg | ORAL_TABLET | Freq: Four times a day (QID) | ORAL | 0 refills | Status: DC | PRN

## 2018-08-20 MED ORDER — AMOXICILLIN-POT CLAVULANATE 875-125 MG PO TABS: 1.0000 | ORAL_TABLET | Freq: Two times a day (BID) | ORAL | 1 refills | Status: DC

## 2018-08-20 NOTE — H&P (View-Only) (Signed)
GASTROENTEROLOGY TELEMEDICINE OUTPATIENT CLINIC VISIT   Primary Care Provider [name], MD [address] [place] [address] [phone]  Patient Profile: Audrey [name] is a 78 y.o. female with a pmh significant for RCC (s/p Nephrectomy on Chemo), DM, GERD, HTN, HLD, CAD, OA, Symptomatic Gallstone Disease (manifested Choledocholithiasis s/p ERCP).  The patient presents to the [facility] for an evaluation and management of problem(s) noted below:  Problem List 1. Elevated LFTs   2. Pain of upper abdomen   3. History of ERCP   4. Constipation, unspecified constipation type   5. Biliary stricture     History of Present Illness: Due to the COVID-19 Pandemic, this service was provided via telemedicine using Audio/Visual Media. The patient was located at home. The provider was located in the office. The patient did consent to this visit and is aware of charges through their insurance. Other persons participating in this telemedicine service were none. Time spent on visit was >25 minutes.  Please see prior consultation note/progress note for full details of HPI.  Interval History Patient has been able to be evaluated by her new cardiologist and is felt to be a candidate for surgery if indicated.  The patient states that since her procedure she has continued to have issues of abdominal pain and discomfort.  She denies any fevers or chills.  She completed her antibiotics that were given postprocedure.  The discomfort began in the navel region and moves upward to her right upper quadrant region.  She has not had a bowel movement in over a week.  Asking her about her bowel habits, however, she has significant constipation and has dealt with that for a long period in time.  She is wondering if the opioid medication is causing her to have worsened issues of constipation.  As per my prior notations in the chart telephone notes worked on trying to optimize her  bowel movements.  She has not taken magnesium citrate yet but is planning to consider using that tonight.  The patient denies any hematochezia.  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  She was seen by gynecology in December or January and was referred to [facility] to consider a hysterectomy or other procedure to help with "vaginal prolapse".  She never followed up with [facility] because it was felt that she was going to be very high risk.  She describes issues with hemorrhoids but does not feel any prolapse of tissue other than hemorrhoids when she has a bowel movement and she tries not to strain.  No overt hematochezia recently but has noted in the past.  Last colonoscopy was done by Dr. Earley Brooke in the last 2 to 3 years and she was told she had a normal colon.  She has never had polyps before.  She thinks she may get some mild relief once she has a bowel movement but the pain that she is describing this feels very similar to what she has had when she has been referred for her gallbladder to be removed.  We obtained labs as noted below as well as a KUB as noted below prior to her clinic visit.  She unfortunately has to miss another round of chemotherapy because her liver tests have remained elevated.  GI Review of Systems Positive as above Negative for dysphagia, odynophagia, nausea, vomiting, change in bowel habits   Review of Systems General: Denies fevers/chills Cardiovascular: Denies chest pain/palpitations Pulmonary: Denies shortness of  breath Gastroenterological: See HPI Genitourinary: Denies darkened urine Hematological: Denies easy bruising Dermatological: Denies jaundice Psychological: Mood is anxious and concerned as she continues to miss chemotherapy   Medications Current Outpatient Medications  Medication Sig Dispense Refill  . acetaminophen (TYLENOL) 500 MG tablet Take 1,000 mg by mouth 2 (two) times daily.     Marland Kitchen amLODipine (NORVASC) 5 MG tablet Take 5 mg by mouth  every evening.   6  . amoxicillin-clavulanate (AUGMENTIN) 875-125 MG tablet Take 1 tablet by mouth 2 (two) times daily for 7 days. 14 tablet 1  . atorvastatin (LIPITOR) 40 MG tablet Take 40 mg by mouth daily at 6 PM.     . Calcium 600-200 MG-UNIT tablet Take 1 tablet by mouth daily at 12 noon.     . carvedilol (COREG) 6.25 MG tablet Take 6.25 mg by mouth 2 (two) times daily.  6  . clobetasol ointment (TEMOVATE) 0.07 % Apply 1 application topically 2 (two) times daily as needed (for skin irritation).     Marland Kitchen denosumab (XGEVA) 120 MG/1.7ML SOLN injection Inject 120 mg into the skin every 6 (six) weeks.    . hydrocortisone (CORTEF) 10 MG tablet Take 5-10 mg by mouth See admin instructions. Take 1 tablet (10 mg) by mouth in the morning & 0.5 tablet (5 mg) by mouth in the evening.    Marland Kitchen levothyroxine (SYNTHROID, LEVOTHROID) 50 MCG tablet Take 50 mcg by mouth daily before breakfast.     . lidocaine-prilocaine (EMLA) cream Apply to affected area once (Patient taking differently: Apply 1 application topically daily as needed (prior to port access). ) 30 g 3  . Nivolumab (OPDIVO IV) Inject 1 Dose into the vein every 14 (fourteen) days.    Marland Kitchen omeprazole (PRILOSEC) 20 MG capsule Take 1 capsule (20 mg total) by mouth 2 (two) times daily. 60 capsule 2  . oxyCODONE (OXY IR/ROXICODONE) 5 MG immediate release tablet Take 1 tablet (5 mg total) by mouth every 6 (six) hours as needed for severe pain. 30 tablet 0  . senna-docusate (SENNA S) 8.6-50 MG tablet Take 2 tablets by mouth at bedtime. 60 tablet 1   No current facility-administered medications for this visit.     Allergies Allergies  Allergen Reactions  . Adhesive [Tape] Other (See Comments)    Tears skin off Paper tape is ok per patient  . Nsaids Other (See Comments)    Liver damage  . Statins Other (See Comments)    Liver/kidney issues.    Histories Past Medical History:  Diagnosis Date  . Arthritis    lower back, hips, hands  . Cancer [facility])     renal cancer - left kidney removed, pill chemo x 1 yr  . Diabetes mellitus ([place])    type 2 - no meds, diet controlled  . Elevated liver enzymes   . GERD (gastroesophageal reflux disease)    occasional - diet controlled  . History of hiatal hernia   . HTN (hypertension)   . Hyperlipidemia   . Hypothyroidism   . Myocardial infarction ([place]) 1991   no deficits  . SVD (spontaneous vaginal delivery)    x 3   Past Surgical History:  Procedure Laterality Date  . BALLOON DILATION N/A 07/23/2018   Procedure: BALLOON DILATION;  Surgeon: Rush Landmark Telford Nab., MD;  Location: [place];  Service: Gastroenterology;  Laterality: N/A;  . BILIARY BRUSHING  08/10/2018   Procedure: BILIARY BRUSHING;  Surgeon: Rush Landmark Telford Nab., MD;  Location: [place];  Service: Gastroenterology;;  .  BILIARY DILATION  08/10/2018   Procedure: BILIARY DILATION;  Surgeon: Mansouraty, Telford Nab., MD;  Location: [place];  Service: Gastroenterology;;  . BILIARY STENT PLACEMENT  08/10/2018   Procedure: BILIARY STENT PLACEMENT;  Surgeon: Irving Copas., MD;  Location: [place];  Service: Gastroenterology;;  . BIOPSY  07/23/2018   Procedure: BIOPSY;  Surgeon: Irving Copas., MD;  Location: [facility]COPY;  Service: Gastroenterology;;  . COLONOSCOPY     normal   . ENDOSCOPIC RETROGRADE CHOLANGIOPANCREATOGRAPHY (ERCP) WITH PROPOFOL N/A 08/10/2018   Procedure: ENDOSCOPIC RETROGRADE CHOLANGIOPANCREATOGRAPHY (ERCP) WITH PROPOFOL;  Surgeon: Irving Copas., MD;  Location: [place];  Service: Gastroenterology;  Laterality: N/A;  . ERCP N/A 07/23/2018   Procedure: ENDOSCOPIC RETROGRADE CHOLANGIOPANCREATOGRAPHY (ERCP);  Surgeon: Irving Copas., MD;  Location: [place];  Service: Gastroenterology;  Laterality: N/A;  . IR IMAGING GUIDED PORT INSERTION  01/08/2018  . LAPAROSCOPIC NEPHRECTOMY Left 08/01/2016   Procedure: LAPAROSCOPIC  RADICAL NEPHRECTOMY/ REPAIR OF UMBILICAL HERNIA;   Surgeon: Raynelle Bring, MD;  Location: [facility];  Service: Urology;  Laterality: Left;  . REMOVAL OF STONES  07/23/2018   Procedure: REMOVAL OF GALL STONES;  Surgeon: Rush Landmark Telford Nab., MD;  Location: [place];  Service: Gastroenterology;;  . REMOVAL OF STONES  08/10/2018   Procedure: REMOVAL OF STONES;  Surgeon: Irving Copas., MD;  Location: [place];  Service: Gastroenterology;;  . [name]  07/23/2018   Procedure: [name];  Surgeon: Irving Copas., MD;  Location: [place];  Service: Gastroenterology;;  . UPPER GI ENDOSCOPY     x 1   Social History   Socioeconomic History  . Marital status: Married    Spouse name: Not on file  . Number of children: 3  . Years of education: Not on file  . Highest education level: Not on file  Occupational History  . Occupation: retired  Scientific laboratory technician  . Financial resource strain: Not on file  . Food insecurity:    Worry: Not on file    Inability: Not on file  . Transportation needs:    Medical: Not on file    Non-medical: Not on file  Tobacco Use  . Smoking status: Former Smoker    Packs/day: 1.00    Years: 8.00    Pack years: 8.00    Types: Cigarettes    Last attempt to quit: 06/18/1964    Years since quitting: 54.2  . Smokeless tobacco: Never Used  Substance and Sexual Activity  . Alcohol use: No  . Drug use: No  . Sexual activity: Not on file  Lifestyle  . Physical activity:    Days per week: Not on file    Minutes per session: Not on file  . Stress: Not on file  Relationships  . Social connections:    Talks on phone: Not on file    Gets together: Not on file    Attends religious service: Not on file    Active member of club or organization: Not on file    Attends meetings of clubs or organizations: Not on file    Relationship status: Not on file  . Intimate partner violence:    Fear of current or ex partner: Not on file    Emotionally abused: Not on file    Physically abused: Not  on file    Forced sexual activity: Not on file  Other Topics Concern  . Not on file  Social History Narrative   Married.  Three children   Family History  Problem Relation Age of Onset  . Heart attack Father 24  . Heart disease Brother 75       CABG  . Colon cancer Neg Hx   . Esophageal cancer Neg Hx   . Inflammatory bowel disease Neg Hx   . Liver disease Neg Hx   . Pancreatic cancer Neg Hx   . Rectal cancer Neg Hx   . Stomach cancer Neg Hx    I have reviewed her medical, social, and family history in detail and updated the electronic medical record as necessary.    PHYSICAL EXAMINATION  Telehealth visit On exam via audio/visual media the patient does not show any evidence of overt jaundice or scleral icterus when she holds her phone up to her eyes She shows tenderness to palpation in the periumbilical region radiating to the right upper quadrant region with mild volitional guarding but no rebound    REVIEW OF DATA  I reviewed the following data at the time of this encounter:  GI Procedures and Studies  August 10, 2018 ERCP - Prior biliary sphincterotomy appeared open. - The upper third of the main bile duct and middle third of the main bile duct were mildly dilated. - A single localized biliary stenosis/caliber change was found in the lower third of the main bile duct. The stricture was indeterminate. This was dilated and then brushed for cytology. - The biliary tree was swept and sludge and pus/cholangitis were found. - To ensure proper drainage in setting of findings of cholangitis and persistently elevated LFTs, one plastic biliary stent was placed into the common bile duct. Diagnosis BILE DUCT BRUSHING DISTAL CBD STRICTURE (SPECIMEN 1 OF 1, COLLECTED 08/10/2018): ATYPICAL CELLS PRESENT.  Laboratory Studies  Reviewed in epic  Imaging Studies  April 2020 [facility] IMPRESSION: 1. Distal common bile duct stricture measuring approximately 11 mm in length shortly before  the level of the ampulla. This is associated with some mild intrahepatic and moderate extrahepatic biliary ductal dilatation, as well as some periportal edema in the hepatic parenchyma. 2. No choledocholithiasis. 3. Cholelithiasis with some gallbladder wall edema, but no frank evidence to strongly suggest an acute cholecystitis at this time.  August 19, 2018 KUB IMPRESSION: Nonobstructive pattern of bowel gas with gas present to the rectum. Large burden of stool and stool balls in the left and right colon. No free air in the abdomen on supine radiographs. Calcific atherosclerosis about the abdomen.   ASSESSMENT  Ms. Vanoverbeke is a 78 y.o. female  with a pmh significant for RCC (s/p Nephrectomy on Chemo), DM, GERD, HTN, HLD, CAD, OA, Symptomatic Gallstone Disease (manifested Choledocholithiasis s/p ERCP).  The patient is seen today for evaluation and management of:  1. Elevated LFTs   2. Pain of upper abdomen   3. History of ERCP   4. Constipation, unspecified constipation type   5. Biliary stricture    The patient remains hemodynamically stable but from a clinical perspective I am perplexed.  Biliary narrowing which we dilated prior to placing plastic stent returned showing signs of atypical cells.  MRI imaging had shown no evidence of a pancreatic mass or lesion.  However on ERCP slow flow was noted and optimized with stent placement suggesting that the narrowing potentially was true in nature.  I presume that this was an inflammatory stricture from the choledocholithiasis that she had evidenced on her first ERCP.  Her liver tests remain elevated with a stent in place though most recent KUB shows that it is not clear  that her biliary stent is still in place.  If this is true, one would presume that the biliary narrowing was improved after dilation and thus the reason for why the stent was out of place.  However I did see evidence of what looked to be cholangitis though pus from the gallbladder  could also be an etiology if she is developing cholecystitis.  Because there is no evidence of a persistent stent in place and I have found evidence of cholangitis pus from the bile duct tree I think she needs to be restarted on antibiotics.  I am going to switch her to Augmentin in effort of trying to minimize the risk of resistance building.  She needs to be scheduled for an earlier ERCP with EUS.  My goal would be to repeat brushings and plan to take bile duct biopsies via EUS if a pancreatic mass is not noted and obviously sample pancreatic mass if 1 is noted.  If by chance her biliary stent is still in place well then her liver tests are not a result of her bile duct and she may end up needing her gallbladder to get a percutaneous cholecystostomy or cholecystectomy.  Obviously, I want her to be able to get chemotherapy as soon as possible but we are obviously dealing with liver tests and having issues with trying to optimize her.  If we end up replacing stents then I will put multiple plastic stents to try and minimize risk of migration.  I will relay this notation to Dr. Barry Dienes who is her primary surgeon as we work on the consideration of surgical intervention of her gallbladder.  I am holding on imaging of her gallbladder right now due to the findings of last ERCP but moving forward she will need to have either a HIDA or a ultrasound to further evaluate things.  With EUS I hope to better visualize the gallbladder as well.  The risks of an ERCP were discussed at length, including but not limited to the risk of perforation, bleeding, abdominal pain, post-ERCP pancreatitis (while usually mild can be severe and even life threatening).  The risks of EUS including bleeding, infection, aspiration pneumonia and intestinal perforation were discussed as was the possibility it may not give a definitive diagnosis.  If a biopsy of the pancreas is done as part of the EUS, there is an additional risk of pancreatitis at the  rate of about 1%.  It was explained that procedure related pancreatitis is typically mild, although can be severe and even life threatening, which is why we do not perform random pancreatic biopsies and only biopsy a lesion we feel is concerning enough to warrant the risk.  The risks and benefits of endoscopic evaluation were discussed with the patient; these include but are not limited to the risk of perforation, infection, bleeding, missed lesions, lack of diagnosis, severe illness requiring hospitalization, as well as anesthesia and sedation related illnesses.  The patient is agreeable to proceed.  We will work to get these scheduled next week.  She knows if she develops significant fevers or progressive abdominal pain and is not tolerating the medications that we have sent her to aid her in the short-term of trying to optimize her then she should be evaluated in the emergency department or hospital over the weekend for earlier.  All patient questions were answered, to the best of my ability, and the patient agrees to the aforementioned plan of action with follow-up as indicated.   PLAN  Augmentin  twice daily till ERCP EUS/ERCP to be scheduled for next week Short term opioid medications to be given to patient Optimize bowel habits by taking magnesium citrate and if by Friday she has not had a bowel movement with magnesium citrate then she will need a full bowel prep We will continue to keep up to date Dr. Barry Dienes about my concerns of her gallbladder We will keep [name] up-to-date about abnormal liver tests as he has had to hold chemotherapy for now   No orders of the defined types were placed in this encounter.   New Prescriptions   AMOXICILLIN-CLAVULANATE (AUGMENTIN) 875-125 MG TABLET    Take 1 tablet by mouth 2 (two) times daily for 7 days.   Modified Medications   Modified Medication Previous Medication   OXYCODONE (OXY IR/ROXICODONE) 5 MG IMMEDIATE RELEASE TABLET oxyCODONE (OXY  IR/ROXICODONE) 5 MG immediate release tablet      Take 1 tablet (5 mg total) by mouth every 6 (six) hours as needed for severe pain.    Take 1 tablet (5 mg total) by mouth every 6 (six) hours as needed for severe pain.    Planned Follow Up: No follow-ups on file.   Justice Britain, MD [facility] Office # [phone]

## 2018-08-20 NOTE — Telephone Encounter (Signed)
Called to request pain medicine Oxycodone IR 5mg . States has had abdominal pain ever since procedure (ERCP). Prescribed by Dr.Kale on 4/10. Since that time, patient has had another ERCP and f/u regarding other GI concerns.    She has an appt this afternoon w/Dr. Rush Landmark (GI).  Information given to [name]. Advised that [name] encourages her to report continued pain and need for pain med to Dr. Rush Landmark for pain management. Patient verbalized understanding.

## 2018-08-20 NOTE — Progress Notes (Signed)
GASTROENTEROLOGY TELEMEDICINE OUTPATIENT CLINIC VISIT   Primary Care Provider [name], MD [address] [place] [address] [phone]  Patient Profile: Audrey [name] is a 78 y.o. female with a pmh significant for RCC (s/p Nephrectomy on Chemo), DM, GERD, HTN, HLD, CAD, OA, Symptomatic Gallstone Disease (manifested Choledocholithiasis s/p ERCP).  The patient presents to the [facility] for an evaluation and management of problem(s) noted below:  Problem List 1. Elevated LFTs   2. Pain of upper abdomen   3. History of ERCP   4. Constipation, unspecified constipation type   5. Biliary stricture     History of Present Illness: Due to the COVID-19 Pandemic, this service was provided via telemedicine using Audio/Visual Media. The patient was located at home. The provider was located in the office. The patient did consent to this visit and is aware of charges through their insurance. Other persons participating in this telemedicine service were none. Time spent on visit was >25 minutes.  Please see prior consultation note/progress note for full details of HPI.  Interval History Patient has been able to be evaluated by her new cardiologist and is felt to be a candidate for surgery if indicated.  The patient states that since her procedure she has continued to have issues of abdominal pain and discomfort.  She denies any fevers or chills.  She completed her antibiotics that were given postprocedure.  The discomfort began in the navel region and moves upward to her right upper quadrant region.  She has not had a bowel movement in over a week.  Asking her about her bowel habits, however, she has significant constipation and has dealt with that for a long period in time.  She is wondering if the opioid medication is causing her to have worsened issues of constipation.  As per my prior notations in the chart telephone notes worked on trying to optimize her  bowel movements.  She has not taken magnesium citrate yet but is planning to consider using that tonight.  The patient denies any hematochezia.  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  She was seen by gynecology in December or January and was referred to [facility] to consider a hysterectomy or other procedure to help with "vaginal prolapse".  She never followed up with [facility] because it was felt that she was going to be very high risk.  She describes issues with hemorrhoids but does not feel any prolapse of tissue other than hemorrhoids when she has a bowel movement and she tries not to strain.  No overt hematochezia recently but has noted in the past.  Last colonoscopy was done by Dr. Earley Brooke in the last 2 to 3 years and she was told she had a normal colon.  She has never had polyps before.  She thinks she may get some mild relief once she has a bowel movement but the pain that she is describing this feels very similar to what she has had when she has been referred for her gallbladder to be removed.  We obtained labs as noted below as well as a KUB as noted below prior to her clinic visit.  She unfortunately has to miss another round of chemotherapy because her liver tests have remained elevated.  GI Review of Systems Positive as above Negative for dysphagia, odynophagia, nausea, vomiting, change in bowel habits   Review of Systems General: Denies fevers/chills Cardiovascular: Denies chest pain/palpitations Pulmonary: Denies shortness of  breath Gastroenterological: See HPI Genitourinary: Denies darkened urine Hematological: Denies easy bruising Dermatological: Denies jaundice Psychological: Mood is anxious and concerned as she continues to miss chemotherapy   Medications Current Outpatient Medications  Medication Sig Dispense Refill  . acetaminophen (TYLENOL) 500 MG tablet Take 1,000 mg by mouth 2 (two) times daily.     Marland Kitchen amLODipine (NORVASC) 5 MG tablet Take 5 mg by mouth  every evening.   6  . amoxicillin-clavulanate (AUGMENTIN) 875-125 MG tablet Take 1 tablet by mouth 2 (two) times daily for 7 days. 14 tablet 1  . atorvastatin (LIPITOR) 40 MG tablet Take 40 mg by mouth daily at 6 PM.     . Calcium 600-200 MG-UNIT tablet Take 1 tablet by mouth daily at 12 noon.     . carvedilol (COREG) 6.25 MG tablet Take 6.25 mg by mouth 2 (two) times daily.  6  . clobetasol ointment (TEMOVATE) 0.96 % Apply 1 application topically 2 (two) times daily as needed (for skin irritation).     Marland Kitchen denosumab (XGEVA) 120 MG/1.7ML SOLN injection Inject 120 mg into the skin every 6 (six) weeks.    . hydrocortisone (CORTEF) 10 MG tablet Take 5-10 mg by mouth See admin instructions. Take 1 tablet (10 mg) by mouth in the morning & 0.5 tablet (5 mg) by mouth in the evening.    Marland Kitchen levothyroxine (SYNTHROID, LEVOTHROID) 50 MCG tablet Take 50 mcg by mouth daily before breakfast.     . lidocaine-prilocaine (EMLA) cream Apply to affected area once (Patient taking differently: Apply 1 application topically daily as needed (prior to port access). ) 30 g 3  . Nivolumab (OPDIVO IV) Inject 1 Dose into the vein every 14 (fourteen) days.    Marland Kitchen omeprazole (PRILOSEC) 20 MG capsule Take 1 capsule (20 mg total) by mouth 2 (two) times daily. 60 capsule 2  . oxyCODONE (OXY IR/ROXICODONE) 5 MG immediate release tablet Take 1 tablet (5 mg total) by mouth every 6 (six) hours as needed for severe pain. 30 tablet 0  . senna-docusate (SENNA S) 8.6-50 MG tablet Take 2 tablets by mouth at bedtime. 60 tablet 1   No current facility-administered medications for this visit.     Allergies Allergies  Allergen Reactions  . Adhesive [Tape] Other (See Comments)    Tears skin off Paper tape is ok per patient  . Nsaids Other (See Comments)    Liver damage  . Statins Other (See Comments)    Liver/kidney issues.    Histories Past Medical History:  Diagnosis Date  . Arthritis    lower back, hips, hands  . Cancer [facility])     renal cancer - left kidney removed, pill chemo x 1 yr  . Diabetes mellitus ([place])    type 2 - no meds, diet controlled  . Elevated liver enzymes   . GERD (gastroesophageal reflux disease)    occasional - diet controlled  . History of hiatal hernia   . HTN (hypertension)   . Hyperlipidemia   . Hypothyroidism   . Myocardial infarction ([place]) 1991   no deficits  . SVD (spontaneous vaginal delivery)    x 3   Past Surgical History:  Procedure Laterality Date  . BALLOON DILATION N/A 07/23/2018   Procedure: BALLOON DILATION;  Surgeon: Rush Landmark Telford Nab., MD;  Location: [place];  Service: Gastroenterology;  Laterality: N/A;  . BILIARY BRUSHING  08/10/2018   Procedure: BILIARY BRUSHING;  Surgeon: Rush Landmark Telford Nab., MD;  Location: [place];  Service: Gastroenterology;;  .  BILIARY DILATION  08/10/2018   Procedure: BILIARY DILATION;  Surgeon: Mansouraty, Telford Nab., MD;  Location: [place];  Service: Gastroenterology;;  . BILIARY STENT PLACEMENT  08/10/2018   Procedure: BILIARY STENT PLACEMENT;  Surgeon: Irving Copas., MD;  Location: [place];  Service: Gastroenterology;;  . BIOPSY  07/23/2018   Procedure: BIOPSY;  Surgeon: Irving Copas., MD;  Location: [facility] ENDOSCOPY;  Service: Gastroenterology;;  . COLONOSCOPY     normal   . ENDOSCOPIC RETROGRADE CHOLANGIOPANCREATOGRAPHY (ERCP) WITH PROPOFOL N/A 08/10/2018   Procedure: ENDOSCOPIC RETROGRADE CHOLANGIOPANCREATOGRAPHY (ERCP) WITH PROPOFOL;  Surgeon: Irving Copas., MD;  Location: [place];  Service: Gastroenterology;  Laterality: N/A;  . ERCP N/A 07/23/2018   Procedure: ENDOSCOPIC RETROGRADE CHOLANGIOPANCREATOGRAPHY (ERCP);  Surgeon: Irving Copas., MD;  Location: [place];  Service: Gastroenterology;  Laterality: N/A;  . IR IMAGING GUIDED PORT INSERTION  01/08/2018  . LAPAROSCOPIC NEPHRECTOMY Left 08/01/2016   Procedure: LAPAROSCOPIC  RADICAL NEPHRECTOMY/ REPAIR OF UMBILICAL HERNIA;   Surgeon: Raynelle Bring, MD;  Location: [facility];  Service: Urology;  Laterality: Left;  . REMOVAL OF STONES  07/23/2018   Procedure: REMOVAL OF GALL STONES;  Surgeon: Rush Landmark Telford Nab., MD;  Location: [place];  Service: Gastroenterology;;  . REMOVAL OF STONES  08/10/2018   Procedure: REMOVAL OF STONES;  Surgeon: Irving Copas., MD;  Location: [place];  Service: Gastroenterology;;  . [name]  07/23/2018   Procedure: [name];  Surgeon: Irving Copas., MD;  Location: [place];  Service: Gastroenterology;;  . UPPER GI ENDOSCOPY     x 1   Social History   Socioeconomic History  . Marital status: Married    Spouse name: Not on file  . Number of children: 3  . Years of education: Not on file  . Highest education level: Not on file  Occupational History  . Occupation: retired  Scientific laboratory technician  . Financial resource strain: Not on file  . Food insecurity:    Worry: Not on file    Inability: Not on file  . Transportation needs:    Medical: Not on file    Non-medical: Not on file  Tobacco Use  . Smoking status: Former Smoker    Packs/day: 1.00    Years: 8.00    Pack years: 8.00    Types: Cigarettes    Last attempt to quit: 06/18/1964    Years since quitting: 54.2  . Smokeless tobacco: Never Used  Substance and Sexual Activity  . Alcohol use: No  . Drug use: No  . Sexual activity: Not on file  Lifestyle  . Physical activity:    Days per week: Not on file    Minutes per session: Not on file  . Stress: Not on file  Relationships  . Social connections:    Talks on phone: Not on file    Gets together: Not on file    Attends religious service: Not on file    Active member of club or organization: Not on file    Attends meetings of clubs or organizations: Not on file    Relationship status: Not on file  . Intimate partner violence:    Fear of current or ex partner: Not on file    Emotionally abused: Not on file    Physically abused: Not  on file    Forced sexual activity: Not on file  Other Topics Concern  . Not on file  Social History Narrative   Married.  Three children   Family History  Problem Relation Age of Onset  . Heart attack Father 29  . Heart disease Brother 75       CABG  . Colon cancer Neg Hx   . Esophageal cancer Neg Hx   . Inflammatory bowel disease Neg Hx   . Liver disease Neg Hx   . Pancreatic cancer Neg Hx   . Rectal cancer Neg Hx   . Stomach cancer Neg Hx    I have reviewed her medical, social, and family history in detail and updated the electronic medical record as necessary.    PHYSICAL EXAMINATION  Telehealth visit On exam via audio/visual media the patient does not show any evidence of overt jaundice or scleral icterus when she holds her phone up to her eyes She shows tenderness to palpation in the periumbilical region radiating to the right upper quadrant region with mild volitional guarding but no rebound    REVIEW OF DATA  I reviewed the following data at the time of this encounter:  GI Procedures and Studies  August 10, 2018 ERCP - Prior biliary sphincterotomy appeared open. - The upper third of the main bile duct and middle third of the main bile duct were mildly dilated. - A single localized biliary stenosis/caliber change was found in the lower third of the main bile duct. The stricture was indeterminate. This was dilated and then brushed for cytology. - The biliary tree was swept and sludge and pus/cholangitis were found. - To ensure proper drainage in setting of findings of cholangitis and persistently elevated LFTs, one plastic biliary stent was placed into the common bile duct. Diagnosis BILE DUCT BRUSHING DISTAL CBD STRICTURE (SPECIMEN 1 OF 1, COLLECTED 08/10/2018): ATYPICAL CELLS PRESENT.  Laboratory Studies  Reviewed in epic  Imaging Studies  April 2020 [facility]P IMPRESSION: 1. Distal common bile duct stricture measuring approximately 11 mm in length shortly before  the level of the ampulla. This is associated with some mild intrahepatic and moderate extrahepatic biliary ductal dilatation, as well as some periportal edema in the hepatic parenchyma. 2. No choledocholithiasis. 3. Cholelithiasis with some gallbladder wall edema, but no frank evidence to strongly suggest an acute cholecystitis at this time.  August 19, 2018 KUB IMPRESSION: Nonobstructive pattern of bowel gas with gas present to the rectum. Large burden of stool and stool balls in the left and right colon. No free air in the abdomen on supine radiographs. Calcific atherosclerosis about the abdomen.   ASSESSMENT  Ms. Pinkstaff is a 78 y.o. female  with a pmh significant for RCC (s/p Nephrectomy on Chemo), DM, GERD, HTN, HLD, CAD, OA, Symptomatic Gallstone Disease (manifested Choledocholithiasis s/p ERCP).  The patient is seen today for evaluation and management of:  1. Elevated LFTs   2. Pain of upper abdomen   3. History of ERCP   4. Constipation, unspecified constipation type   5. Biliary stricture    The patient remains hemodynamically stable but from a clinical perspective I am perplexed.  Biliary narrowing which we dilated prior to placing plastic stent returned showing signs of atypical cells.  MRI imaging had shown no evidence of a pancreatic mass or lesion.  However on ERCP slow flow was noted and optimized with stent placement suggesting that the narrowing potentially was true in nature.  I presume that this was an inflammatory stricture from the choledocholithiasis that she had evidenced on her first ERCP.  Her liver tests remain elevated with a stent in place though most recent KUB shows that it is not clear  that her biliary stent is still in place.  If this is true, one would presume that the biliary narrowing was improved after dilation and thus the reason for why the stent was out of place.  However I did see evidence of what looked to be cholangitis though pus from the gallbladder  could also be an etiology if she is developing cholecystitis.  Because there is no evidence of a persistent stent in place and I have found evidence of cholangitis pus from the bile duct tree I think she needs to be restarted on antibiotics.  I am going to switch her to Augmentin in effort of trying to minimize the risk of resistance building.  She needs to be scheduled for an earlier ERCP with EUS.  My goal would be to repeat brushings and plan to take bile duct biopsies via EUS if a pancreatic mass is not noted and obviously sample pancreatic mass if 1 is noted.  If by chance her biliary stent is still in place well then her liver tests are not a result of her bile duct and she may end up needing her gallbladder to get a percutaneous cholecystostomy or cholecystectomy.  Obviously, I want her to be able to get chemotherapy as soon as possible but we are obviously dealing with liver tests and having issues with trying to optimize her.  If we end up replacing stents then I will put multiple plastic stents to try and minimize risk of migration.  I will relay this notation to Dr. Barry Dienes who is her primary surgeon as we work on the consideration of surgical intervention of her gallbladder.  I am holding on imaging of her gallbladder right now due to the findings of last ERCP but moving forward she will need to have either a HIDA or a ultrasound to further evaluate things.  With EUS I hope to better visualize the gallbladder as well.  The risks of an ERCP were discussed at length, including but not limited to the risk of perforation, bleeding, abdominal pain, post-ERCP pancreatitis (while usually mild can be severe and even life threatening).  The risks of EUS including bleeding, infection, aspiration pneumonia and intestinal perforation were discussed as was the possibility it may not give a definitive diagnosis.  If a biopsy of the pancreas is done as part of the EUS, there is an additional risk of pancreatitis at the  rate of about 1%.  It was explained that procedure related pancreatitis is typically mild, although can be severe and even life threatening, which is why we do not perform random pancreatic biopsies and only biopsy a lesion we feel is concerning enough to warrant the risk.  The risks and benefits of endoscopic evaluation were discussed with the patient; these include but are not limited to the risk of perforation, infection, bleeding, missed lesions, lack of diagnosis, severe illness requiring hospitalization, as well as anesthesia and sedation related illnesses.  The patient is agreeable to proceed.  We will work to get these scheduled next week.  She knows if she develops significant fevers or progressive abdominal pain and is not tolerating the medications that we have sent her to aid her in the short-term of trying to optimize her then she should be evaluated in the emergency department or hospital over the weekend for earlier.  All patient questions were answered, to the best of my ability, and the patient agrees to the aforementioned plan of action with follow-up as indicated.   PLAN  Augmentin  twice daily till ERCP EUS/ERCP to be scheduled for next week Short term opioid medications to be given to patient Optimize bowel habits by taking magnesium citrate and if by Friday she has not had a bowel movement with magnesium citrate then she will need a full bowel prep We will continue to keep up to date Dr. Barry Dienes about my concerns of her gallbladder We will keep [name] up-to-date about abnormal liver tests as he has had to hold chemotherapy for now   No orders of the defined types were placed in this encounter.   New Prescriptions   AMOXICILLIN-CLAVULANATE (AUGMENTIN) 875-125 MG TABLET    Take 1 tablet by mouth 2 (two) times daily for 7 days.   Modified Medications   Modified Medication Previous Medication   OXYCODONE (OXY IR/ROXICODONE) 5 MG IMMEDIATE RELEASE TABLET oxyCODONE (OXY  IR/ROXICODONE) 5 MG immediate release tablet      Take 1 tablet (5 mg total) by mouth every 6 (six) hours as needed for severe pain.    Take 1 tablet (5 mg total) by mouth every 6 (six) hours as needed for severe pain.    Planned Follow Up: No follow-ups on file.   Justice Britain, MD [facility] Office # [phone]

## 2018-08-21 ENCOUNTER — Other Ambulatory Visit: Payer: Self-pay

## 2018-08-21 ENCOUNTER — Telehealth: Payer: Self-pay

## 2018-08-21 DIAGNOSIS — K838 Other specified diseases of biliary tract: Secondary | ICD-10-CM

## 2018-08-21 DIAGNOSIS — K8050 Calculus of bile duct without cholangitis or cholecystitis without obstruction: Secondary | ICD-10-CM

## 2018-08-21 NOTE — Telephone Encounter (Signed)
I spoke with the patient and she has had 4 large BM today and feels that she is cleaned out. She is notified of EUS at [facility] for 08/27/18.  She is asked to arrive at 11:30 and be NPO after midnight.  Patient verbalized understanding that this is now for Thursday and not Wednesday per her previous conversation with Dr. Rush Landmark.

## 2018-08-21 NOTE — Telephone Encounter (Signed)
-----   Message from Irving Copas., MD sent at 08/21/2018  2:05 PM EDT ----- Regarding: Patient Follow up Dear Chong Sicilian or Barbera Setters, I hope that you are well. This is a patient that if you can follow up with her about how her bowel habits are doing. If she is not having Bms with the mag Citrate then we need to proceed with a bowel preparation. She was OK with Miralax preparation or another type of preparation if we have a sample, but she also lives in [place], [place] so that would be difficult for her to come. She has significant stool burden and needs to complete this. I also need to proceed with an EUS/ERCP. I need to do this next week. Lets get it scheduled on Wednesday next week. I would schedule it as a 2 hour procedure. My first procedure that day can also be a 2-hour procedure. If questions let me know. Thanks. Chester Holstein

## 2018-08-23 DIAGNOSIS — R109 Unspecified abdominal pain: Secondary | ICD-10-CM | POA: Insufficient documentation

## 2018-08-23 DIAGNOSIS — R1010 Upper abdominal pain, unspecified: Secondary | ICD-10-CM

## 2018-08-23 DIAGNOSIS — K831 Obstruction of bile duct: Secondary | ICD-10-CM | POA: Insufficient documentation

## 2018-08-23 DIAGNOSIS — K5900 Constipation, unspecified: Secondary | ICD-10-CM | POA: Insufficient documentation

## 2018-08-26 ENCOUNTER — Encounter (HOSPITAL_COMMUNITY): Payer: Self-pay | Admitting: *Deleted

## 2018-08-26 ENCOUNTER — Other Ambulatory Visit: Payer: Self-pay

## 2018-08-26 NOTE — Progress Notes (Signed)
Pt denies SOB and chest pain. Pt stated that she is under the care of Dr. Percival Spanish, Cardiology. Pt made aware to stop taking  vitamins, fish oil and herbal medications. Do not take any NSAIDs ie: Ibuprofen, Advil, Naproxen (Aleve), Motrin, BC and Goody Powder. Pt denies that she and family memebers tested positive for COVID-19.   Coronavirus Screening  Pt denies that she and family memebers experienced the following symptoms:  Cough yes/no: No Fever (>100.15F)  yes/no: No Runny nose yes/no: No Sore throat yes/no: No Difficulty breathing/shortness of breath  yes/no: No  Have you or a family member traveled in the last 14 days and where? yes/no: No   Pt reminded that hospital visitation restrictions are in effect and the importance of the restrictions.  Pt verbalized understanding of all pre-op instructions.

## 2018-08-27 ENCOUNTER — Ambulatory Visit (HOSPITAL_COMMUNITY): Payer: Medicare Other | Admitting: Certified Registered"

## 2018-08-27 ENCOUNTER — Ambulatory Visit (HOSPITAL_BASED_OUTPATIENT_CLINIC_OR_DEPARTMENT_OTHER)
Admission: RE | Admit: 2018-08-27 | Discharge: 2018-08-27 | Disposition: A | Discharge status: Home / Self Care | Payer: Medicare Other | Source: Home / Self Care | Attending: Gastroenterology | Admitting: Gastroenterology

## 2018-08-27 ENCOUNTER — Other Ambulatory Visit: Payer: Self-pay

## 2018-08-27 ENCOUNTER — Encounter (HOSPITAL_COMMUNITY)
Admission: RE | Disposition: A | Discharge status: Home / Self Care | Payer: Self-pay | Source: Home / Self Care | Attending: Gastroenterology

## 2018-08-27 ENCOUNTER — Encounter (HOSPITAL_COMMUNITY): Payer: Self-pay

## 2018-08-27 ENCOUNTER — Ambulatory Visit (HOSPITAL_COMMUNITY): Payer: Medicare Other

## 2018-08-27 DIAGNOSIS — I10 Essential (primary) hypertension: Secondary | ICD-10-CM | POA: Insufficient documentation

## 2018-08-27 DIAGNOSIS — M47817 Spondylosis without myelopathy or radiculopathy, lumbosacral region: Secondary | ICD-10-CM | POA: Insufficient documentation

## 2018-08-27 DIAGNOSIS — K838 Other specified diseases of biliary tract: Secondary | ICD-10-CM

## 2018-08-27 DIAGNOSIS — E119 Type 2 diabetes mellitus without complications: Secondary | ICD-10-CM | POA: Insufficient documentation

## 2018-08-27 DIAGNOSIS — K219 Gastro-esophageal reflux disease without esophagitis: Secondary | ICD-10-CM | POA: Insufficient documentation

## 2018-08-27 DIAGNOSIS — M19042 Primary osteoarthritis, left hand: Secondary | ICD-10-CM | POA: Insufficient documentation

## 2018-08-27 DIAGNOSIS — Z85528 Personal history of other malignant neoplasm of kidney: Secondary | ICD-10-CM | POA: Insufficient documentation

## 2018-08-27 DIAGNOSIS — R1013 Epigastric pain: Secondary | ICD-10-CM | POA: Insufficient documentation

## 2018-08-27 DIAGNOSIS — K8071 Calculus of gallbladder and bile duct without cholecystitis with obstruction: Secondary | ICD-10-CM | POA: Insufficient documentation

## 2018-08-27 DIAGNOSIS — Z7952 Long term (current) use of systemic steroids: Secondary | ICD-10-CM | POA: Insufficient documentation

## 2018-08-27 DIAGNOSIS — K5900 Constipation, unspecified: Secondary | ICD-10-CM | POA: Insufficient documentation

## 2018-08-27 DIAGNOSIS — N289 Disorder of kidney and ureter, unspecified: Secondary | ICD-10-CM | POA: Insufficient documentation

## 2018-08-27 DIAGNOSIS — M19041 Primary osteoarthritis, right hand: Secondary | ICD-10-CM | POA: Insufficient documentation

## 2018-08-27 DIAGNOSIS — K8067 Calculus of gallbladder and bile duct with acute and chronic cholecystitis with obstruction: Secondary | ICD-10-CM | POA: Diagnosis not present

## 2018-08-27 DIAGNOSIS — E039 Hypothyroidism, unspecified: Secondary | ICD-10-CM | POA: Insufficient documentation

## 2018-08-27 DIAGNOSIS — I252 Old myocardial infarction: Secondary | ICD-10-CM | POA: Insufficient documentation

## 2018-08-27 DIAGNOSIS — R109 Unspecified abdominal pain: Secondary | ICD-10-CM | POA: Diagnosis not present

## 2018-08-27 DIAGNOSIS — R945 Abnormal results of liver function studies: Secondary | ICD-10-CM | POA: Insufficient documentation

## 2018-08-27 DIAGNOSIS — K831 Obstruction of bile duct: Secondary | ICD-10-CM

## 2018-08-27 DIAGNOSIS — Z886 Allergy status to analgesic agent status: Secondary | ICD-10-CM | POA: Insufficient documentation

## 2018-08-27 DIAGNOSIS — Z888 Allergy status to other drugs, medicaments and biological substances status: Secondary | ICD-10-CM | POA: Insufficient documentation

## 2018-08-27 DIAGNOSIS — Z79899 Other long term (current) drug therapy: Secondary | ICD-10-CM | POA: Insufficient documentation

## 2018-08-27 DIAGNOSIS — M160 Bilateral primary osteoarthritis of hip: Secondary | ICD-10-CM | POA: Insufficient documentation

## 2018-08-27 DIAGNOSIS — Z4659 Encounter for fitting and adjustment of other gastrointestinal appliance and device: Secondary | ICD-10-CM | POA: Diagnosis not present

## 2018-08-27 DIAGNOSIS — Z79890 Hormone replacement therapy (postmenopausal): Secondary | ICD-10-CM | POA: Insufficient documentation

## 2018-08-27 DIAGNOSIS — E785 Hyperlipidemia, unspecified: Secondary | ICD-10-CM | POA: Insufficient documentation

## 2018-08-27 DIAGNOSIS — Z87891 Personal history of nicotine dependence: Secondary | ICD-10-CM | POA: Insufficient documentation

## 2018-08-27 DIAGNOSIS — R932 Abnormal findings on diagnostic imaging of liver and biliary tract: Secondary | ICD-10-CM | POA: Insufficient documentation

## 2018-08-27 DIAGNOSIS — Z905 Acquired absence of kidney: Secondary | ICD-10-CM | POA: Insufficient documentation

## 2018-08-27 HISTORY — PX: FINE NEEDLE ASPIRATION: SHX5430

## 2018-08-27 HISTORY — PX: STENT REMOVAL: SHX6421

## 2018-08-27 HISTORY — PX: ERCP: SHX5425

## 2018-08-27 HISTORY — PX: REMOVAL OF STONES: SHX5545

## 2018-08-27 HISTORY — PX: ESOPHAGOGASTRODUODENOSCOPY (EGD) WITH PROPOFOL: SHX5813

## 2018-08-27 HISTORY — PX: EUS: SHX5427

## 2018-08-27 HISTORY — PX: BILIARY STENT PLACEMENT: SHX5538

## 2018-08-27 HISTORY — DX: Unspecified cataract: H26.9

## 2018-08-27 HISTORY — PX: BILIARY DILATION: SHX6850

## 2018-08-27 LAB — GLUCOSE, CAPILLARY
Glucose-Capillary: 305 mg/dL — ABNORMAL HIGH (ref 70–99)
Glucose-Capillary: 333 mg/dL — ABNORMAL HIGH (ref 70–99)

## 2018-08-27 SURGERY — ESOPHAGOGASTRODUODENOSCOPY (EGD) WITH PROPOFOL
Anesthesia: General

## 2018-08-27 MED ORDER — CIPROFLOXACIN IN D5W 400 MG/200ML IV SOLN
INTRAVENOUS | Status: AC
  Filled 2018-08-27: qty 200

## 2018-08-27 MED ORDER — SODIUM CHLORIDE 0.9 % IV SOLN
INTRAVENOUS | Status: DC | PRN
  Administered 2018-08-27: 30 mL

## 2018-08-27 MED ORDER — SODIUM CHLORIDE 0.9 % IV SOLN
INTRAVENOUS | Status: DC | PRN
  Administered 2018-08-27: 14:00:00 60 ug/min via INTRAVENOUS

## 2018-08-27 MED ORDER — ROCURONIUM BROMIDE 50 MG/5ML IV SOSY
PREFILLED_SYRINGE | INTRAVENOUS | Status: DC | PRN
  Administered 2018-08-27: 30 mg via INTRAVENOUS
  Administered 2018-08-27 (×2): 10 mg via INTRAVENOUS

## 2018-08-27 MED ORDER — GLUCAGON HCL RDNA (DIAGNOSTIC) 1 MG IJ SOLR
INTRAMUSCULAR | Status: DC | PRN
  Administered 2018-08-27 (×3): 0.25 mg via INTRAVENOUS

## 2018-08-27 MED ORDER — LIDOCAINE 2% (20 MG/ML) 5 ML SYRINGE
INTRAMUSCULAR | Status: DC | PRN
  Administered 2018-08-27: 60 mg via INTRAVENOUS

## 2018-08-27 MED ORDER — GLYCOPYRROLATE PF 0.2 MG/ML IJ SOSY
PREFILLED_SYRINGE | INTRAMUSCULAR | Status: DC | PRN
  Administered 2018-08-27: .1 mg via INTRAVENOUS

## 2018-08-27 MED ORDER — INDOMETHACIN 50 MG RE SUPP
RECTAL | Status: DC | PRN
  Administered 2018-08-27: 100 mg via RECTAL

## 2018-08-27 MED ORDER — CIPROFLOXACIN HCL 500 MG PO TABS: 500.0000 mg | ORAL_TABLET | Freq: Two times a day (BID) | ORAL | 0 refills | Status: DC

## 2018-08-27 MED ORDER — INDOMETHACIN 50 MG RE SUPP
RECTAL | Status: AC
  Filled 2018-08-27: qty 2

## 2018-08-27 MED ORDER — DEXAMETHASONE SODIUM PHOSPHATE 10 MG/ML IJ SOLN
INTRAMUSCULAR | Status: DC | PRN
  Administered 2018-08-27: 6 mg via INTRAVENOUS

## 2018-08-27 MED ORDER — LACTATED RINGERS IV SOLN
INTRAVENOUS | Status: DC | PRN
  Administered 2018-08-27 (×2): via INTRAVENOUS

## 2018-08-27 MED ORDER — PHENYLEPHRINE 40 MCG/ML (10ML) SYRINGE FOR IV PUSH (FOR BLOOD PRESSURE SUPPORT)
PREFILLED_SYRINGE | INTRAVENOUS | Status: DC | PRN
  Administered 2018-08-27 (×2): 80 ug via INTRAVENOUS

## 2018-08-27 MED ORDER — PROPOFOL 10 MG/ML IV BOLUS
INTRAVENOUS | Status: DC | PRN
  Administered 2018-08-27: 120 mg via INTRAVENOUS

## 2018-08-27 MED ORDER — FENTANYL CITRATE (PF) 100 MCG/2ML IJ SOLN
INTRAMUSCULAR | Status: DC | PRN
  Administered 2018-08-27 (×2): 50 ug via INTRAVENOUS

## 2018-08-27 MED ORDER — GLUCAGON HCL RDNA (DIAGNOSTIC) 1 MG IJ SOLR
INTRAMUSCULAR | Status: AC
  Filled 2018-08-27: qty 1

## 2018-08-27 MED ORDER — SUGAMMADEX SODIUM 200 MG/2ML IV SOLN
INTRAVENOUS | Status: DC | PRN
  Administered 2018-08-27: 150 mg via INTRAVENOUS

## 2018-08-27 MED ORDER — ONDANSETRON HCL 4 MG/2ML IJ SOLN
INTRAMUSCULAR | Status: DC | PRN
  Administered 2018-08-27: 4 mg via INTRAVENOUS

## 2018-08-27 MED ORDER — SUCCINYLCHOLINE CHLORIDE 200 MG/10ML IV SOSY
PREFILLED_SYRINGE | INTRAVENOUS | Status: DC | PRN
  Administered 2018-08-27: 80 mg via INTRAVENOUS

## 2018-08-27 MED ORDER — CIPROFLOXACIN IN D5W 400 MG/200ML IV SOLN
INTRAVENOUS | Status: DC | PRN
  Administered 2018-08-27: 400 mg via INTRAVENOUS

## 2018-08-27 SURGICAL SUPPLY — 14 items

## 2018-08-27 NOTE — Discharge Instructions (Signed)
YOU HAD AN ENDOSCOPIC PROCEDURE TODAY: Refer to the procedure report and other information in the discharge instructions given to you for any specific questions about what was found during the examination. If this information does not answer your questions, please call [place] office at [phone] to clarify.   YOU SHOULD EXPECT: Some feelings of bloating in the abdomen. Passage of more gas than usual. Walking can help get rid of the air that was put into your GI tract during the procedure and reduce the bloating. If you had a lower endoscopy (such as a colonoscopy or flexible sigmoidoscopy) you may notice spotting of blood in your stool or on the toilet paper. Some abdominal soreness may be present for a day or two, also.  DIET: Your first meal following the procedure should be a light meal and then it is ok to progress to your normal diet. A half-sandwich or bowl of soup is an example of a good first meal. Heavy or fried foods are harder to digest and may make you feel nauseous or bloated. Drink plenty of fluids but you should avoid alcoholic beverages for 24 hours. If you had a esophageal dilation, please see attached instructions for diet.    ACTIVITY: Your care partner should take you home directly after the procedure. You should plan to take it easy, moving slowly for the rest of the day. You can resume normal activity the day after the procedure however YOU SHOULD NOT DRIVE, use power tools, machinery or perform tasks that involve climbing or major physical exertion for 24 hours (because of the sedation medicines used during the test).   SYMPTOMS TO REPORT IMMEDIATELY: A gastroenterologist can be reached at any hour. Please call [phone]  for any of the following symptoms:   Following upper endoscopy (EGD, EUS, ERCP, esophageal dilation) Vomiting of blood or coffee ground material  New, significant abdominal pain  New, significant chest pain or pain under the shoulder blades  Painful or  persistently difficult swallowing  New shortness of breath  Black, tarry-looking or red, bloody stools  FOLLOW UP:  If any biopsies were taken you will be contacted by phone or by letter within the next 1-3 weeks. Call [phone]  if you have not heard about the biopsies in 3 weeks.  Please also call with any specific questions about appointments or follow up tests.  

## 2018-08-27 NOTE — Anesthesia Preprocedure Evaluation (Signed)
Anesthesia Evaluation  Patient identified by MRN, date of birth, ID band Patient awake    Reviewed: Allergy & Precautions, H&P , NPO status , Patient's Chart, lab work & pertinent test results, reviewed documented beta blocker date and time   Airway Mallampati: II  TM Distance: >3 FB Neck ROM: Full    Dental no notable dental hx. (+) Teeth Intact, Dental Advisory Given   Pulmonary neg pulmonary ROS, former smoker,    Pulmonary exam normal breath sounds clear to auscultation       Cardiovascular hypertension, Pt. on medications and Pt. on home beta blockers + Past MI   Rhythm:Regular Rate:Normal     Neuro/Psych negative neurological ROS  negative psych ROS   GI/Hepatic Neg liver ROS, hiatal hernia, GERD  Medicated and Controlled,  Endo/Other  diabetesHypothyroidism   Renal/GU Renal InsufficiencyRenal disease  negative genitourinary   Musculoskeletal  (+) Arthritis , Osteoarthritis,    Abdominal   Peds  Hematology negative hematology ROS (+)   Anesthesia Other Findings   Reproductive/Obstetrics negative OB ROS                             Anesthesia Physical Anesthesia Plan  ASA: III  Anesthesia Plan: General   Post-op Pain Management:    Induction: Intravenous  PONV Risk Score and Plan: 3 and Ondansetron and Dexamethasone  Airway Management Planned: Oral ETT  Additional Equipment:   Intra-op Plan:   Post-operative Plan: Extubation in OR  Informed Consent: I have reviewed the patients History and Physical, chart, labs and discussed the procedure including the risks, benefits and alternatives for the proposed anesthesia with the patient or authorized representative who has indicated his/her understanding and acceptance.     Dental advisory given  Plan Discussed with: CRNA  Anesthesia Plan Comments:         Anesthesia Quick Evaluation

## 2018-08-27 NOTE — Op Note (Signed)
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 Estimated Blood Loss:     Estimated blood loss was minimal. Procedure:                Pre-Anesthesia Assessment:                           - Prior to the procedure, a History and Physical                            was performed, and patient medications and                            allergies were reviewed. The patient's tolerance of                            previous anesthesia was also reviewed. The risks                            and benefits of the procedure and the sedation                            options and risks were discussed with the patient.                            All questions were answered, and informed consent                            was obtained. Prior Anticoagulants: The patient has                            taken no previous anticoagulant or antiplatelet                            agents. ASA Grade Assessment: III - A patient with  severe systemic disease. After reviewing the risks                            and benefits, the patient was deemed in                            satisfactory condition to undergo the procedure.                           After obtaining informed consent, the endoscope was                            passed under direct vision. Throughout the                             procedure, the patient's blood pressure, pulse, and                            oxygen saturations were monitored continuously. The                            [id number]-Q180V ([id number] Olympus duodenoscope was                            introduced through the mouth, and advanced to the                            second part of duodenum. The [id number] ([id number])                            Olympus Linear EUS was introduced through the                            mouth, and advanced to the duodenum for ultrasound                            examination from the stomach and duodenum. The                            upper EUS was accomplished without difficulty. The                            patient tolerated the procedure. Scope In: Scope Out: Findings:      ENDOSCOPIC FINDING: :      No gross lesions were noted in the entire examined stomach.      A previously placed plastic biliary stent was seen at the ampulla.       Removal of a stent was accomplished with a snare. The stent was sent for       cytology evaluation.      There was evidence of a patent sphincterotomy at the ampulla after stent       removal.      ENDOSONOGRAPHIC FINDING: :      There was mild dilation in the common bile duct in the middle and upper  third of the duct.      There was a suggestion of a narrowing in the distal lower third of the       main bile duct. There is no overt mass/lesion noted here or in the head       of the pancreas. However, due to the previously noted Atypical Cells on       Biliary brushings, decision was made to pursue further sampling. Fine       needle biopsy was performed. Color Doppler imaging was utilized prior to       needle puncture to confirm a lack of significant vascular structures       within the needle path. Five passes were made with the 25 gauge Acquire       ultrasound biopsy needle using a transduodenal approach. Visible cores       of tissue were  obtained. A preliminary cytologic examination was       performed. Final cytology results are pending.      A small amount of hyperechoic material consistent with pneumobilia vs       biliary sludge was visualized endosonographically in the common bile       duct.      Moderate hyperechoic material consistent with sludge was visualized       endosonographically in the gallbladder with gallbladder stone noted as       well.      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       These consisted of diffuse heterogeneity. No overt mass was noted in the       head of the pancreas at the region noted near narrowing above. The       pancreatic duct at the head measured 1.5 mm -> 1.8 mm. The pancreatic       duct at the neck measured 0.7 mm -> 1.0 mm. The pancreatic duct at the       body measured 0.8 mm. The pancreatic duct at the tail measured 0.4 mm.      Endosonographic imaging of the ampulla showed no intramural       (subepithelial) lesion.      Endosonographic imaging in the visualized portion of the liver showed no       mass.      The celiac region was visualized.      Anechoic lesions suggestive of cysts vs peripancreatic fluid collections       were identified in the region of the left kidney. Impression:               EGD Impression:                           - No gross lesions in the stomach.                           - Plastic biliary stent in the duodenum. Removed.                           - Patent sphincterotomy, characterized by healthy                            appearing mucosa was found.  EUS Impression:                           - There was dilation in the common bile duct. Mild                            pneumobilia vs biliary sludge noted but no overt                            choledocholithiasis.                           - There was a suggestion of a narrowing in the                            distal lower third of  the main bile duct. Fine                            needle biopsy performed in this area due to                            previously noted atypical biliary brushings.                           - Material consistent with moderate sludge was                            visualized endosonographically in the gallbladder.                            No overt pericholecystic fluid was noted in regards                            to concern for overt cholecystitis.                           - Pancreatic parenchymal abnormalities consisting                            of diffuse heterogeneity were noted in the entire                            pancreas. No mass was appreciated at the region of                            the common bile duct narrowing.                           - Renal cysts vs peripancreatic fluid collections -                            based on recent MRI more likely renal cysts present. Recommendation:           - Proceed to scheduled ERCP.                           -  Observe patient's clinical course.                           - Await cytology results.                           - Consider a Pancreatic protocol CT if things                            continue to be an issue or concern.                           - The findings and recommendations were discussed                            with the patient.                           - The findings and recommendations were discussed                            with the patient's family. Procedure Code(s):        --- Professional ---                           [id number], Esophagogastroduodenoscopy, flexible,                            transoral; with transendoscopic ultrasound-guided                            intramural or transmural fine needle                            aspiration/biopsy(s), (includes endoscopic                            ultrasound examination limited to the esophagus,                            stomach or duodenum, and  adjacent structures)                           43247, Esophagogastroduodenoscopy, flexible,                            transoral; with removal of foreign body(s) Diagnosis Code(s):        --- Professional ---                           D63.875, Other specified postprocedural states                           Z46.59, Encounter for fitting and adjustment of                            other gastrointestinal appliance and device  K83.8, Other specified diseases of biliary tract                           K86.9, Disease of pancreas, unspecified                           K92.9, Disease of digestive system, unspecified                           K83.1, Obstruction of bile duct                           R94.5, Abnormal results of liver function studies                           R93.2, Abnormal findings on diagnostic imaging of                            liver and biliary tract CPT copyright 2019 [employer]. All rights reserved. The codes documented in this report are preliminary and upon coder review may  be revised to meet current compliance requirements. Justice Britain, MD 08/27/2018 4:30:22 PM Number of Addenda: 0

## 2018-08-27 NOTE — Progress Notes (Signed)
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 The patient's last visit with [name] was on 07/31/18. The pt reports that she is doing well overall.  In the interim the pt had a third ERCP on 08/27/18 and was placed on Cipro. Cytology was taken which is currently pending.  The pt reports that she is having less abdominal pain but still endorses "some." She notes that she is eating well and staying modestly well hydrated. She received a liter and a half of IVF yesterday after her ERCP.   The pt denies nasal discharge, congestion.  Lab results today (08/28/18) of CBC w/diff is as follows: all values are WNL except for RBC at 3.70, HGB at 11.1, HCT at 34.6, Monocytes abs at 1.3k. 08/28/18 CMP is pending  On review of systems, pt reports much improved abdominal pain, stable energy levels, eating well, slightly hydrated, and denies nasal discharge, congestion, leg swelling, and any other symptoms.   MEDICAL HISTORY:    Hypertension Dyslipidemia Osteoarthritis Ex-smoker Coronary artery disease Thyroid disorder-was apparently on levothyroxine 25 g daily which has subsequently been discontinued . 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 Earley Brooke Colonoscopy 03/2016 Dr. Earley Brooke  SOCIAL HISTORY: Social History   Socioeconomic History   Marital status: Married    Spouse name:  Not on file   Number of children: 3   Years of education: Not on file   Highest education level: Not on file  Occupational History   Occupation: retired  Scientist, product/process development strain: Not on file   Food insecurity:    Worry: Not on file    Inability: Not on file   Transportation needs:    Medical: Not on file    Non-medical: Not on file  Tobacco Use   Smoking status: Former Smoker    Packs/day: 1.00    Years: 8.00    Pack years: 8.00    Types: Cigarettes    Last attempt to quit: 06/18/1964    Years since quitting: 54.2   Smokeless tobacco: Never Used  Substance and Sexual Activity   Alcohol use: No   Drug use: No   Sexual activity: Not on file  Lifestyle   Physical activity:    Days per week: Not on file    Minutes per session: Not on file   Stress: Not on file  Relationships   Social connections:    Talks on phone: Not on file    Gets together: Not on file    Attends religious service: Not on file    Active member of club or organization: Not on file    Attends meetings of clubs or organizations: Not on file    Relationship status: Not on file   Intimate partner violence:    Fear of current or ex partner: Not on file    Emotionally abused: Not on file    Physically abused:  Not on file    Forced sexual activity: Not on file  Other Topics Concern   Not on file  Social History Narrative   Married.  Three children   Patient lives in [place] Works as a Solicitor part-time Ex-smoker quit long time ago In 1961 . Smoked 2 packs per day for 8 years prior to that .  or a lot of stress since her daughter is also getting treated for stage IV uterine cancer.  FAMILY HISTORY:  Mother deceased Father with asthma and emphysema died at 2 years with the MI. Brother deceased of heart disease   ALLERGIES:  is allergic to adhesive [tape]; nsaids; and statins.  MEDICATIONS:  Current Outpatient Medications  Medication Sig  Dispense Refill   acetaminophen (TYLENOL) 500 MG tablet Take 1,000 mg by mouth 2 (two) times daily.      amLODipine (NORVASC) 5 MG tablet Take 5 mg by mouth every evening.   6   atorvastatin (LIPITOR) 40 MG tablet Take 40 mg by mouth daily at 6 PM.      Calcium 600-200 MG-UNIT tablet Take 1 tablet by mouth daily at 12 noon.      carvedilol (COREG) 6.25 MG tablet Take 6.25 mg by mouth 2 (two) times daily.  6   ciprofloxacin (CIPRO) 500 MG tablet Take 1 tablet (500 mg total) by mouth 2 (two) times daily for 3 days. 6 tablet 0   clobetasol ointment (TEMOVATE) 1.61 % Apply 1 application topically 2 (two) times daily as needed (for skin irritation).      denosumab (XGEVA) 120 MG/1.7ML SOLN injection Inject 120 mg into the skin every 6 (six) weeks.     hydrocortisone (CORTEF) 10 MG tablet Take 5-10 mg by mouth See admin instructions. Take 1 tablet (10 mg) by mouth in the morning & 0.5 tablet (5 mg) by mouth in the evening.     levothyroxine (SYNTHROID, LEVOTHROID) 50 MCG tablet Take 50 mcg by mouth daily before breakfast.      lidocaine-prilocaine (EMLA) cream Apply to affected area once (Patient taking differently: Apply 1 application topically daily as needed (prior to port access). ) 30 g 3   Nivolumab (OPDIVO IV) Inject 1 Dose into the vein every 14 (fourteen) days.     omeprazole (PRILOSEC) 20 MG capsule Take 1 capsule (20 mg total) by mouth 2 (two) times daily. 60 capsule 2   oxyCODONE (OXY IR/ROXICODONE) 5 MG immediate release tablet Take 1 tablet (5 mg total) by mouth every 6 (six) hours as needed for severe pain. 30 tablet 0   senna-docusate (SENNA S) 8.6-50 MG tablet Take 2 tablets by mouth at bedtime. 60 tablet 1   Current Facility-Administered Medications  Medication Dose Route Frequency Provider Last Rate Last Dose   0.9 %  sodium chloride infusion   Intravenous Continuous Irene Limbo, Cloria [place], MD        REVIEW OF SYSTEMS:    A 10+ POINT REVIEW OF SYSTEMS WAS OBTAINED  including neurology, dermatology, psychiatry, cardiac, respiratory, lymph, extremities, GI, GU, Musculoskeletal, constitutional, breasts, reproductive, HEENT.  All pertinent positives are noted in the HPI.  All others are negative.   PHYSICAL EXAMINATION:  ECOG PERFORMANCE STATUS: 1 - Symptomatic but completely ambulatory  Vitals:   08/28/18 1036  BP: (!) 145/77  Pulse: (!) 56  Resp: 18  Temp: 98.9 F (37.2 C)  SpO2: 100%   Filed Weights   08/28/18 1036  Weight: 133 lb 8 oz (60.6 kg)   .Body  mass index is 26.96 kg/m.   GENERAL:alert, in no acute distress and comfortable SKIN: no acute rashes, no significant lesions EYES: conjunctiva are pink and non-injected, sclera anicteric OROPHARYNX: MMM, no exudates, no oropharyngeal erythema or ulceration NECK: supple, no JVD LYMPH:  no palpable lymphadenopathy in the cervical, axillary or inguinal regions LUNGS: clear to auscultation b/l with normal respiratory effort HEART: regular rate & rhythm ABDOMEN:  normoactive bowel sounds, tender to palpation at the epigastric area, negative Murphy's sign, not distended. No palpable hepatosplenomegaly. Extremity: no pedal edema PSYCH: alert & oriented x 3 with fluent speech NEURO: no focal motor/sensory deficits   LABORATORY DATA:  I have reviewed the data as listed  . CBC Latest Ref Rng & Units 08/28/2018 07/31/2018 07/03/2018  WBC 4.0 - 10.5 K/uL 9.0 9.8 7.9  Hemoglobin 12.0 - 15.0 g/dL 11.1(L) 11.8(L) 11.7(L)  Hematocrit 36.0 - 46.0 % 34.6(L) 37.3 36.6  Platelets 150 - 400 K/uL 232 256 250    CBC    Component Value Date/Time   WBC 9.0 08/28/2018 1017   RBC 3.70 (L) 08/28/2018 1017   HGB 11.1 (L) 08/28/2018 1017   HGB 11.2 (L) 02/27/2018 0835   HGB 11.2 (L) 04/04/2017 0830   HCT 34.6 (L) 08/28/2018 1017   HCT 35.2 04/04/2017 0830   PLT 232 08/28/2018 1017   PLT 219 02/27/2018 0835   PLT 250 04/04/2017 0830   MCV 93.5 08/28/2018 1017   MCV 107.6 (H) 04/04/2017 0830   MCH 30.0  08/28/2018 1017   MCHC 32.1 08/28/2018 1017   RDW 13.6 08/28/2018 1017   RDW 14.0 04/04/2017 0830   LYMPHSABS 0.8 08/28/2018 1017   LYMPHSABS 1.6 04/04/2017 0830   MONOABS 1.3 (H) 08/28/2018 1017   MONOABS 0.4 04/04/2017 0830   EOSABS 0.0 08/28/2018 1017   EOSABS 0.1 04/04/2017 0830   BASOSABS 0.0 08/28/2018 1017   BASOSABS 0.0 04/04/2017 0830     . CMP Latest Ref Rng & Units 08/19/2018 08/06/2018 07/31/2018  Glucose 70 - 99 mg/dL 287(H) 167(H) 191(H)  BUN 6 - 23 mg/dL 23 26(H) 16  Creatinine 0.40 - 1.20 mg/dL 1.52(H) 1.64(H) 1.45(H)  Sodium 135 - 145 mEq/L 136 137 134(L)  Potassium 3.5 - 5.1 mEq/L 4.6 4.6 5.3(H)  Chloride 96 - 112 mEq/L 102 101 104  CO2 19 - 32 mEq/L 27 25 20(L)  Calcium 8.4 - 10.5 mg/dL 8.0(L) 8.9 7.8(L)  Total Protein 6.0 - 8.3 g/dL 5.9(L) 7.0 6.6  Total Bilirubin 0.2 - 1.2 mg/dL 0.4 0.5 0.4  Alkaline Phos 39 - 117 U/L 100 100 87  AST 0 - 37 U/L 69(H) 66(H) 84(H)  ALT 0 - 35 U/L 125(H) 115(H) 110(H)   B12 level 299 (OSH)-->455  . Lab Results  Component Value Date   LDH 136 03/13/2018         RADIOGRAPHIC STUDIES: I have personally reviewed the radiological images as listed and agreed with the findings in the report. Nm Pet Image Restag (ps) Skull Base To Thigh  Result Date: 01/03/2017 IMPRESSION: 1. There is a new lesion in the hepatic dome which is FDG avid and low in attenuation on CT imaging consistent with metastatic disease. Another region of uptake in the left hepatic lobe posteriorly demonstrates no CT correlate. A second subtle metastasis is not excluded on today's study. An MRI could better assess for other hepatic metastases if clinically warranted. 2. New metastatic lesion in the left side of T8. 3. New pulmonary nodule in the right upper  lobe. This is too small to characterize but suspicious. Recommend attention on follow-up. No FDG avid nodules or other enlarging nodules. 4. The uptake at the previous left renal artery has almost resolved  in the interval and is favored to be post therapeutic. Recommend attention on follow-up. 5. The metastasis in the posterior right hilum seen previously has resolved. Electronically Signed   By: Dorise Bullion III M.D   On: 01/03/2017 11:16   .Dg Abd 1 View  Result Date: 08/19/2018 CLINICAL DATA:  Generalized abdominal pain and distention. Constipation. EXAM: ABDOMEN - 1 VIEW COMPARISON:  None. FINDINGS: Nonobstructive pattern of bowel gas with gas present to the rectum. Large burden of stool and stool balls in the left and right colon. No free air in the abdomen on supine radiographs. Calcific atherosclerosis about the abdomen. IMPRESSION: Nonobstructive pattern of bowel gas with gas present to the rectum. Large burden of stool and stool balls in the left and right colon. No free air in the abdomen on supine radiographs. Calcific atherosclerosis about the abdomen. Electronically Signed   By: Eddie Candle M.D.   On: 08/19/2018 11:40   Mr Abdomen Mrcp Wo Contrast  Result Date: 08/06/2018 CLINICAL DATA:  78 year old female with history of renal cell carcinoma status post left nephrectomy and chemotherapy. Abdominal pain for 1 month. Biliary ductal dilatation noted on recent ultrasound examination. EXAM: MRI ABDOMEN WITHOUT CONTRAST  (INCLUDING MRCP) TECHNIQUE: Multiplanar multisequence MR imaging of the abdomen was performed. Heavily T2-weighted images of the biliary and pancreatic ducts were obtained, and three-dimensional MRCP images were rendered by post processing. COMPARISON:  No priors. FINDINGS: Comment: Today's study is limited for detection and characterization of visceral and/or vascular lesions by lack of IV gadolinium. Lower chest: Small hiatal hernia. Hepatobiliary: No definite suspicious hepatic lesions are confidently identified on today's noncontrast examination. MRCP images demonstrate multiple filling defects within the gallbladder, compatible with cholelithiasis. Gallbladder does not appear  distended. Gallbladder wall does appear thickened and edematous, but there is no pericholecystic fluid noted at this time. There is mild intrahepatic biliary ductal dilatation, as well as some mild periductal increased T2 signal intensity which is most compatible with periportal edema. Common bile duct is dilated and measures up to 11 mm in the porta hepatis. In the distal common bile duct there is long segment narrowing of the distal common bile duct measuring 11 mm in length shortly before the level of the ampulla, concerning for a stricture (coronal image 9 of series 13). No definite filling defect within the common bile duct to suggest choledocholithiasis. Pancreas: No pancreatic mass confidently identified on today's noncontrast examination. No pancreatic ductal dilatation noted on MRCP images. No pancreatic or peripancreatic fluid or inflammatory changes. Spleen:  Unremarkable. Adrenals/Urinary Tract: Status post left nephrectomy. No abnormal soft tissue mass in the nephrectomy bed to suggest locally recurrent disease. Multiple ovoid shaped T1 hypointense, T2 hyperintense lesions associated with the right renal pelvis, presumably parapelvic cysts. No right hydroureteronephrosis. Right adrenal gland is normal in appearance. Left adrenal gland is not confidently identified may be surgically absent. Stomach/Bowel: Visualized portions are unremarkable. Vascular/Lymphatic: Aortic atherosclerosis without definite aneurysm in the abdominal vasculature. No lymphadenopathy noted in the abdomen. Other: No significant volume of ascites noted in the visualized portions of the peritoneal cavity. Musculoskeletal: No aggressive appearing osseous lesions are noted in the visualized portions of the skeleton. IMPRESSION: 1. Distal common bile duct stricture measuring approximately 11 mm in length shortly before the level of the ampulla. This is  associated with some mild intrahepatic and moderate extrahepatic biliary ductal  dilatation, as well as some periportal edema in the hepatic parenchyma. 2. No choledocholithiasis. 3. Cholelithiasis with some gallbladder wall edema, but no frank evidence to strongly suggest an acute cholecystitis at this time. Electronically Signed   By: Vinnie Langton M.D.   On: 08/06/2018 12:02   Dg Ercp Biliary & Pancreatic Ducts  Result Date: 08/27/2018 CLINICAL DATA:  78 year old female with common bile duct dilation EXAM: ERCP TECHNIQUE: Multiple spot images obtained with the fluoroscopic device and submitted for interpretation post-procedure. FLUOROSCOPY TIME:  Fluoroscopy Time:  7 minutes COMPARISON:  MR 08/06/2018 FINDINGS: Limited intraoperative fluoroscopic spot images of ERCP. Initial image demonstrates endoscope projecting over the upper abdomen with cannulation of the ampulla and retrograde contrast. Borderline dilated extrahepatic biliary ducts. There is then deployment of a retrieval balloon. Final image demonstrates placement of 2 parallel plastic biliary stents. IMPRESSION: Limited images of ERCP demonstrates partial opacification of the extrahepatic biliary ducts, deployment of a retrieval balloon, and placement of parallel plastic biliary stents. Please refer to the dictated operative report for full details of intraoperative findings and procedure. Electronically Signed   By: Corrie Mckusick D.O.   On: 08/27/2018 15:34   Dg Ercp Biliary & Pancreatic Ducts  Result Date: 08/10/2018 CLINICAL DATA:  Biliary stricture EXAM: ERCP TECHNIQUE: Multiple spot images obtained with the fluoroscopic device and submitted for interpretation post-procedure. FLUOROSCOPY TIME:  Fluoroscopy Time:  7 minutes and 10 seconds Radiation Exposure Index (if provided by the fluoroscopic device): Number of Acquired Spot Images: 0 COMPARISON:  None. FINDINGS: Imaging demonstrates cannulation of the common bile duct and contrast filling the biliary tree. Balloon stone retrieval is documented. A temporary biliary  stent has been placed across the ampulla of Vater. IMPRESSION: See above. These images were submitted for radiologic interpretation only. Please see the procedural report for the amount of contrast and the fluoroscopy time utilized. Electronically Signed   By: Marybelle Killings M.D.   On: 08/10/2018 14:25    ASSESSMENT & PLAN:    78 y.o. 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 PET/CT 06/19/2017 -- consistent with partial metabolic response to treatment.  Rt adrenal gland bx - showed clear cell RCC  S/p CYtoreductive left radical nephrectomy and left adrenal gland resection on 08/01/2016 by Dr Alinda Money.  10/16/17 PET/CT revealed Continued improvement, with the T7 metastatic lesion no longer significantly hypermetabolic. The previous right lower lobe pulmonary nodule is even less apparent, perhaps about 2 mm in diameter today; given that this measured 6 mm on 03/28/2017 this probably represents an effectively treated metastatic lesion. Currently no appreciable hypermetabolic activity is identified to suggest active malignancy. Distended gallbladder with gallbladder wall thickening and gallstones. Correlate clinically in assessing for cholecystitis. Small but abnormal amount of free pelvic fluid, nonspecific. Other imaging findings of potential clinical significance: Chronic ethmoid sinusitis. Aortic Atherosclerosis. Stable 5 mm right middle lobe pulmonary nodule, not hypermetabolic but below sensitive PET-CT size thresholds. Left nephrectomy. Notable pelvic floor laxity with cystocele. Chronic bilateral Sacroiliitis.  04/03/18 PET/CT revealed No evidence for new or progressive hypermetabolic disease on today's study to suggest metastatic progression. 2. Stable appearance of the T7 metastatic lesion without Hypermetabolism. 3. Tiny focus of FDG accumulation identified along the  skin of the low right inguinal fold. No associated lesion evident on CT. This may be related to urinary contaminant.  4. Cholelithiasis with similar appearance of diffuse gallbladder wall thickening. 5. Stable 5 mm right middle lobe pulmonary nodule. 6.  Aortic Atherosclerois. 7. Diffuse colonic diverticulosis.   #2 b/l adrenal metastases from [place] s/p left adrenalectomy with adrenal insuff - follows with Dr Buddy Duty.  #3 Small pulmonary lesions -- 10/16/17 PET/CT showed pulmonary less apparent than 03/28/17 PET/CT, decreasing from 53mm to 77mm diameter.   MRI brain shows no evidence of metastatic disease  #4 T7/8 Bone metastases - received Xgeva every 4 weeks from May 2018 to June 2019. 10/16/17 PET/CT revealed improvement with T7 metastatic lesion no longer significantly hypermetabolic.  -on Marchelle Folks  #5 ?liver mets- rpt PET/CT from 10/16/2017 shows no overt evidence of metastatic disease in the liver.  #6 Grade 1 Nausea - improved and intermittent. Hasnt used her anti-emetic as instructed so some nausea and decreased po intake.  #7 Grade 1 Diarrhea - resolved  #8 Hyponatremia -  resolved with sodium at 137 - likely related to some element of adrenal insufficiency, diarrhea, limited by mouth intake. Primarily solute free fluid intake.   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 -f/u with PCP for continued mx   #12 Moderate protein calorie malnutrition Weight has stabilized and improved Wt Readings from Last 3 Encounters:  08/28/18 133 lb 8 oz (60.6 kg)  08/27/18 130 lb (59 kg)  08/14/18 131 lb (59.4 kg)   Plan: Continue healthy po intake/diabetic diet -Previously recommended the patient to drink atleast 48-64 oz of fluids daily -f/u with PCP/Cardiology for diuretics management  #13  Hypothyroidism/Adrenal insufficiency/Diabetes -Continue being followed by Dr. Buddy Duty  #14 HTN - ?control. Patient tends to be anxious and has higher blood pressures in the clinic. She can have increased blood pressure from Sutent as well. Plan: -continued close f/u with her PCP /cardiology regarding the many elements necessary for her care that are not directly related to her oncology care. -ACEI held due to AKI and hyperkalemia - following with cardiology to mx this. Has been started on Amlodipine instead.  #15 Grade 1 mucositis- resolved -Advised the patient to continue use of Magic Mouthwash to aid with nutrition  #16 Newly diagnosed ?CAD - positive cardiac nuclear stress test Following with cardiology and nephrology ? Need for pre-operative cardiac cath  #17 Choledocholithiasis and cholelithiasis 07/10/18 US Abdomen revealed Biliary duct dilatation with 8 mm calculus at the level of the ampulla in the distal common bile duct. 2. Cholelithiasis. No gallbladder wall thickening or pericholecystic fluid. 3. Appearance of the liver raises concern for underlying hepatic cirrhosis. No focal liver lesions are demonstrable. 4.  Left kidney absent. 5.  Small cysts in right kidney. 6.  Aortic Atherosclerosis.  S/p ERCP on 07/23/18 with Dr. Valarie Merino Mansouraty  PLAN:  -Discussed pt labwork today, 08/28/18; blood counts are stable -08/28/18 Chemistries show much improved LFts -The pt has no prohibitive toxicities from proceeding with C9 Nivolumab at this time, as pt's previous abnormal liver functions have been appropriately explained and addressed with multiple ERCPs. -continue f/u with GI and Sx for cholelithiasis and choledocholithiasis. -Will order one liter of IVF today as well -Continue follow up with endocrinology later today as planned -Recommend using Senna or Miralax for constipation -Continue with OTC Lidocaine patch for lower back pain  -Recommend that the pt keep well moisturized, suspecting  that patient does have dry skin -Hydroxyzine as well for itching -Continue Xgeva every 4 weeks  -Recommended that the pt continue to eat well, drink at least 48-64 oz of water each day, and walk 20-30 minutes each day.  -Will see the pt back in 2 weeks    Please schedule next 2 treatments with Nivolumab with labs RTC with Dr Irene Limbo in 2 weeks with labs   All of the patients questions were answered with apparent satisfaction. The patient knows to call the clinic with any problems, questions or concerns.  The total time spent in the appt was 25 minutes and more than 50% was on counseling and direct patient cares.   Sullivan Lone MD [place] AAHIVMS [facility] Hematology/Oncology Physician [facility]  (Office):       [phone] (Work cell):  [phone] (Fax):           [phone]  I, Baldwin Jamaica, am acting as a scribe for Dr. Sullivan Lone.   .I have reviewed the above documentation for accuracy and completeness, and I agree with the above. Brunetta Genera MD

## 2018-08-27 NOTE — Op Note (Signed)
[facility] Patient Name: Audrey [name] Procedure Date : 08/27/2018 MRN: [id number] Attending MD: Justice Britain , MD Date of Birth: [birth year] CSN: [id number] Age: 78 Admit Type: Outpatient Procedure:                ERCP Indications:              Epigastric abdominal pain, Abnormal MRCP, Abnormal                            liver function test, Biliary stent removal - noted                            XRay suggeste stent migration (however on EUS noted                            to still have biliary stent), Concern for                            symptomatic biliary colic Providers:                Justice Britain, MD, Baird Cancer, RN, William Dalton, Technician, Vania Rea, CRNA Referring MD:             Julien Nordmann MD, MD Medicines:                General Anesthesia, Indomethacin 100 mg PR, Cipro                            902 mg IV Complications:            No immediate complications. Estimated Blood Loss:     Estimated blood loss was minimal. Procedure:                Pre-Anesthesia Assessment:                           - Prior to the procedure, a History and Physical                            was performed, and patient medications and                            allergies were reviewed. The patient's tolerance of                            previous anesthesia was also reviewed. The risks                            and benefits of the procedure and the sedation                            options and risks were discussed with the patient.  All questions were answered, and informed consent                            was obtained. Prior Anticoagulants: The patient has                            taken no previous anticoagulant or antiplatelet                            agents. ASA Grade Assessment: III - A patient with                            severe systemic disease. After reviewing the  risks                            and benefits, the patient was deemed in                            satisfactory condition to undergo the procedure.                           After obtaining informed consent, the scope was                            passed under direct vision. Throughout the                            procedure, the patient's blood pressure, pulse, and                            oxygen saturations were monitored continuously. The                            TJF-Q180V ([id number]) Olympus duodenoscope was                            introduced through the mouth, and used to inject                            contrast into and used to inject contrast into the                            bile duct. The ERCP was accomplished without                            difficulty. The patient tolerated the procedure. Findings:      The scout film was normal - the previously noted biliary stent had been       removed during EUS portion of procedures.      The esophagus was successfully intubated under direct vision without       detailed examination of the pharynx, larynx, and associated structures,       and upper GI tract. A biliary sphincterotomy had been performed. The       sphincterotomy appeared  open.      A short 0.035 inch Soft Jagwire was passed into the biliary tree. The       Autotome sphincterotome was passed over the guidewire and the bile duct       was then deeply cannulated. Contrast was injected. I personally       interpreted the bile duct images. Ductal flow of contrast was adequate.       Image quality was adequate. Contrast extended to the hepatic ducts.       Opacification of the entire biliary tree except for the cystic duct and       gallbladder was successful. The middle third of the main bile duct and       upper third of the main bile duct were moderately dilated. The largest       diameter was 10 mm. The lower third of the main duct contained a single       mild  narrowing ~20 mm in length - though improved from previous ERCP       attempt. Opacification of the gallbladder failed initially. To discover       objects, the biliary tree was swept with a retrieval balloon starting at       the bifurcation. Sludge was swept from the duct. Pus was not noted on       this occasions. The balloon passed much easier than on previous ERCP.       The lower third of the main bile duct at the narrowing was successfully       dilated with an 11-28-08 mm balloon (to a maximum balloon size of 10 mm)       dilator. Cells for cytology were obtained by brushing in the lower third       of the main bile duct. An occlusion cholangiogram was performed that       showed no further significant biliary pathology and also passed much       easier throughout the entire length of balloon trawl. Contrast was noted       to flow from the biliary tract more appropriately than at last       procedure. Two 10 Fr by 5 cm plastic biliary stents with a single       external flap and a single internal flap were placed into the common       bile duct. Bile flowed through the stents. The stents were in good       position.      A pancreatogram was not performed.      The duodenoscope was withdrawn from the patient. Impression:               - Prior biliary sphincterotomy appeared open.                            Previously placed stent had been removed during EUS.                           - A single mild biliary narrowing was found in the                            lower third of the main bile duct. This was  improved from prior ERCP. This region was dilated                            further with a balloon. Then it was brushed for                            cytology.                           - The upper third of the main bile duct and middle                            third of the main bile duct were moderately dilated.                           - The biliary  tree was swept and sludge was found.                            No evidence of pus/cholangitis at this time.                           - Two plastic biliary stents were placed into the                            common bile duct to traverse the narrowing at the                            distal duct, but contrast moved appropriately                            before the stents were placed. Recommendation:           - The patient will be observed post-procedure,                            until all discharge criteria are met.                           - Discharge patient to home.                           - Low fat diet indefinitely.                           - Watch for pancreatitis, bleeding, perforation,                            and cholangitis.                           - Ciprofloxacin 500 mg BID x 3-days.                           - Observe patient's clinical course.                           -  Repeat LFTs in 1-2 weeks or earlier (seeing                            Oncologist in next few days).                           - At this point, the patient's LFT pattern cannot                            be associated with biliary tract disease. This is                            case in point, since the patient had abnormal LFTs                            with a bile duct stent that was in place last week.                            I am concerned that her abnormal LFTs and her                            recurrent pain and now the inability to see the                            cystic duct or gallbladder are manifestations of                            progressive gallbladder disease. Based on the                            sampling via EUS and on ERCP today, will have to                            consider the role of cholecystectomy vs                            percutaneous cholecystostomy tube placement vs                            cholecystoenterostomy.                           -  Repeat ERCP in 3-6 months for stent                            removal/exchange.                           - Will discuss with referring providers her case.                           - The findings and recommendations were discussed  with the patient.                           - The findings and recommendations were discussed                            with the patient's family. Procedure Code(s):        --- Professional ---                           [id number], Endoscopic retrograde                            cholangiopancreatography (ERCP); with placement of                            endoscopic stent into biliary or pancreatic duct,                            including pre- and post-dilation and guide wire                            passage, when performed, including sphincterotomy,                            when performed, each stent                           43274, 32, Endoscopic retrograde                            cholangiopancreatography (ERCP); with placement of                            endoscopic stent into biliary or pancreatic duct,                            including pre- and post-dilation and guide wire                            passage, when performed, including sphincterotomy,                            when performed, each stent                           43264, Endoscopic retrograde                            cholangiopancreatography (ERCP); with removal of                            calculi/debris from biliary/pancreatic duct(s) Diagnosis Code(s):        --- Professional ---                           K83.1, Obstruction of bile duct  R10.13, Epigastric pain                           R94.5, Abnormal results of liver function studies                           Z46.59, Encounter for fitting and adjustment of                            other gastrointestinal appliance and device                           K83.8, Other  specified diseases of biliary tract                           R93.2, Abnormal findings on diagnostic imaging of                            liver and biliary tract CPT copyright 2019 American Medical Association. All rights reserved. The codes documented in this report are preliminary and upon coder review may  be revised to meet current compliance requirements. Justice Britain, MD 08/27/2018 4:43:48 PM Number of Addenda: 0

## 2018-08-27 NOTE — Interval H&P Note (Signed)
History and Physical Interval Note:  08/27/2018 12:36 PM  Audrey [name]  has presented today for surgery, with the diagnosis of choledocholithiasis dilated CBD.  The various methods of treatment have been discussed with the patient and family. After consideration of risks, benefits and other options for treatment, the patient has consented to  Procedure(s): ESOPHAGOGASTRODUODENOSCOPY (EGD) WITH PROPOFOL (N/A) ESOPHAGEAL ENDOSCOPIC ULTRASOUND (EUS) RADIAL (N/A) ENDOSCOPIC RETROGRADE CHOLANGIOPANCREATOGRAPHY (ERCP) (N/A) as a surgical intervention.  The patient's history has been reviewed, patient examined, no change in status, stable for surgery.  I have reviewed the patient's chart and labs.  Questions were answered to the patient's satisfaction.    The risks and benefits of endoscopic evaluation were discussed with the patient; these include but are not limited to the risk of perforation, infection, bleeding, missed lesions, lack of diagnosis, severe illness requiring hospitalization, as well as anesthesia and sedation related illnesses.  The patient is agreeable to proceed.   The risks of an ERCP were discussed at length, including but not limited to the risk of perforation, bleeding, abdominal pain, post-ERCP pancreatitis (while usually mild can be severe and even life threatening).   [employer]

## 2018-08-27 NOTE — Transfer of Care (Signed)
Immediate Anesthesia Transfer of Care Note  Patient: Audrey [name]  Procedure(s) Performed: ESOPHAGOGASTRODUODENOSCOPY (EGD) WITH PROPOFOL (N/A ) ESOPHAGEAL ENDOSCOPIC ULTRASOUND (EUS) RADIAL (N/A ) ENDOSCOPIC RETROGRADE CHOLANGIOPANCREATOGRAPHY (ERCP) (N/A ) BILIARY STENT PLACEMENT BILIARY DILATION REMOVAL OF STONES FINE NEEDLE ASPIRATION (FNA) LINEAR STENT REMOVAL  Patient Location: Endoscopy Unit  Anesthesia Type:General  Level of Consciousness: awake and patient cooperative  Airway & Oxygen Therapy: Patient Spontanous Breathing and Patient connected to nasal cannula oxygen  Post-op Assessment: Report given to RN and Post -op Vital signs reviewed and stable  Post vital signs: Reviewed and stable  Last Vitals:  Vitals Value Taken Time  BP    Temp    Pulse 76 08/27/2018  3:25 PM  Resp 18 08/27/2018  3:25 PM  SpO2 99 % 08/27/2018  3:25 PM  Vitals shown include unvalidated device data.  Last Pain:  Vitals:   08/27/18 1131  TempSrc: Oral  PainSc: 5          Complications: No apparent anesthesia complications

## 2018-08-27 NOTE — Progress Notes (Signed)
Pt reports she checked her blood sugar this morning and it was 174. Pt refusing to have CBG checked at this time.

## 2018-08-27 NOTE — Anesthesia Postprocedure Evaluation (Signed)
Anesthesia Post Note  Patient: Audrey [name]  Procedure(s) Performed: ESOPHAGOGASTRODUODENOSCOPY (EGD) WITH PROPOFOL (N/A ) ESOPHAGEAL ENDOSCOPIC ULTRASOUND (EUS) RADIAL (N/A ) ENDOSCOPIC RETROGRADE CHOLANGIOPANCREATOGRAPHY (ERCP) (N/A ) BILIARY STENT PLACEMENT BILIARY DILATION REMOVAL OF STONES FINE NEEDLE ASPIRATION (FNA) LINEAR STENT REMOVAL     Patient location during evaluation: Endoscopy Anesthesia Type: General Level of consciousness: awake and alert Pain management: pain level controlled Vital Signs Assessment: post-procedure vital signs reviewed and stable Respiratory status: spontaneous breathing, nonlabored ventilation and respiratory function stable Cardiovascular status: blood pressure returned to baseline and stable Postop Assessment: no apparent nausea or vomiting Anesthetic complications: no    Last Vitals:  Vitals:   08/27/18 1635 08/27/18 1645  BP: (!) 149/53 (!) 152/59  Pulse: (!) 55 (!) 59  Resp: 13 14  Temp:    SpO2: 100% 98%    Last Pain:  Vitals:   08/27/18 1645  TempSrc:   PainSc: 0-No pain                 Calley Drenning,W. EDMOND

## 2018-08-27 NOTE — Anesthesia Procedure Notes (Signed)
Procedure Name: Intubation Date/Time: 08/27/2018 12:58 PM Performed by: Orlie Dakin, CRNA Pre-anesthesia Checklist: Patient identified, Emergency Drugs available, Suction available and Patient being monitored Patient Re-evaluated:Patient Re-evaluated prior to induction Oxygen Delivery Method: Circle system utilized Preoxygenation: Pre-oxygenation with 100% oxygen Induction Type: IV induction and Rapid sequence Laryngoscope Size: Miller and 3 Grade View: Grade I Tube type: Oral Tube size: 6.5 mm Number of attempts: 1 Airway Equipment and Method: Stylet Placement Confirmation: ETT inserted through vocal cords under direct vision,  positive ETCO2 and breath sounds checked- equal and bilateral Secured at: 22 cm Tube secured with: Tape Dental Injury: Teeth and Oropharynx as per pre-operative assessment  Comments: RSI due to Covid-19 pandemic concerns.  Minimal extension of neck with DL.

## 2018-08-27 NOTE — Progress Notes (Signed)
Pt CBG checked in recovery, found to be 303 Fitzgerald MD advises to recheck in 30 min., upon rechecking CBG 333. Ola Spurr made aware and advises for patient to continue to check blood sugar at home. Pt agrees and reports she has appointment with her endocrinologist tomorrow.

## 2018-08-27 NOTE — Progress Notes (Signed)
Mansouraty MD at bedside speaking with patient about ERCP today and plan moving forward.   Recheck of CBG is 275. Fitzgerald MD made aware and advises pt is safe to go home at this time.

## 2018-08-28 ENCOUNTER — Inpatient Hospital Stay: Payer: Medicare Other

## 2018-08-28 ENCOUNTER — Other Ambulatory Visit: Payer: Self-pay

## 2018-08-28 ENCOUNTER — Inpatient Hospital Stay: Payer: Medicare Other | Attending: Hematology

## 2018-08-28 ENCOUNTER — Inpatient Hospital Stay (HOSPITAL_BASED_OUTPATIENT_CLINIC_OR_DEPARTMENT_OTHER): Payer: Medicare Other | Admitting: Hematology

## 2018-08-28 ENCOUNTER — Telehealth: Payer: Self-pay | Admitting: Hematology

## 2018-08-28 VITALS — BP 145/77 | HR 56 | Temp 98.9°F | Resp 18 | Ht 59.0 in | Wt 133.5 lb

## 2018-08-28 DIAGNOSIS — E079 Disorder of thyroid, unspecified: Secondary | ICD-10-CM

## 2018-08-28 DIAGNOSIS — Z79899 Other long term (current) drug therapy: Secondary | ICD-10-CM

## 2018-08-28 DIAGNOSIS — I252 Old myocardial infarction: Secondary | ICD-10-CM

## 2018-08-28 DIAGNOSIS — K5730 Diverticulosis of large intestine without perforation or abscess without bleeding: Secondary | ICD-10-CM

## 2018-08-28 DIAGNOSIS — C7951 Secondary malignant neoplasm of bone: Secondary | ICD-10-CM

## 2018-08-28 DIAGNOSIS — T464X5A Adverse effect of angiotensin-converting-enzyme inhibitors, initial encounter: Secondary | ICD-10-CM | POA: Insufficient documentation

## 2018-08-28 DIAGNOSIS — K8070 Calculus of gallbladder and bile duct without cholecystitis without obstruction: Secondary | ICD-10-CM

## 2018-08-28 DIAGNOSIS — C787 Secondary malignant neoplasm of liver and intrahepatic bile duct: Secondary | ICD-10-CM | POA: Insufficient documentation

## 2018-08-28 DIAGNOSIS — K649 Unspecified hemorrhoids: Secondary | ICD-10-CM

## 2018-08-28 DIAGNOSIS — I129 Hypertensive chronic kidney disease with stage 1 through stage 4 chronic kidney disease, or unspecified chronic kidney disease: Secondary | ICD-10-CM | POA: Insufficient documentation

## 2018-08-28 DIAGNOSIS — N189 Chronic kidney disease, unspecified: Secondary | ICD-10-CM | POA: Insufficient documentation

## 2018-08-28 DIAGNOSIS — E119 Type 2 diabetes mellitus without complications: Secondary | ICD-10-CM | POA: Insufficient documentation

## 2018-08-28 DIAGNOSIS — E440 Moderate protein-calorie malnutrition: Secondary | ICD-10-CM | POA: Insufficient documentation

## 2018-08-28 DIAGNOSIS — Z87891 Personal history of nicotine dependence: Secondary | ICD-10-CM | POA: Insufficient documentation

## 2018-08-28 DIAGNOSIS — Z905 Acquired absence of kidney: Secondary | ICD-10-CM | POA: Insufficient documentation

## 2018-08-28 DIAGNOSIS — M1990 Unspecified osteoarthritis, unspecified site: Secondary | ICD-10-CM | POA: Insufficient documentation

## 2018-08-28 DIAGNOSIS — I10 Essential (primary) hypertension: Secondary | ICD-10-CM | POA: Insufficient documentation

## 2018-08-28 DIAGNOSIS — E860 Dehydration: Secondary | ICD-10-CM

## 2018-08-28 DIAGNOSIS — Z5112 Encounter for antineoplastic immunotherapy: Secondary | ICD-10-CM | POA: Insufficient documentation

## 2018-08-28 DIAGNOSIS — E538 Deficiency of other specified B group vitamins: Secondary | ICD-10-CM | POA: Insufficient documentation

## 2018-08-28 DIAGNOSIS — K5900 Constipation, unspecified: Secondary | ICD-10-CM | POA: Insufficient documentation

## 2018-08-28 DIAGNOSIS — C7972 Secondary malignant neoplasm of left adrenal gland: Secondary | ICD-10-CM | POA: Insufficient documentation

## 2018-08-28 DIAGNOSIS — K449 Diaphragmatic hernia without obstruction or gangrene: Secondary | ICD-10-CM | POA: Insufficient documentation

## 2018-08-28 DIAGNOSIS — I700 Atherosclerosis of aorta: Secondary | ICD-10-CM

## 2018-08-28 DIAGNOSIS — I340 Nonrheumatic mitral (valve) insufficiency: Secondary | ICD-10-CM

## 2018-08-28 DIAGNOSIS — C642 Malignant neoplasm of left kidney, except renal pelvis: Secondary | ICD-10-CM | POA: Diagnosis not present

## 2018-08-28 DIAGNOSIS — E875 Hyperkalemia: Secondary | ICD-10-CM | POA: Insufficient documentation

## 2018-08-28 DIAGNOSIS — E2740 Unspecified adrenocortical insufficiency: Secondary | ICD-10-CM

## 2018-08-28 DIAGNOSIS — E039 Hypothyroidism, unspecified: Secondary | ICD-10-CM | POA: Insufficient documentation

## 2018-08-28 DIAGNOSIS — E1122 Type 2 diabetes mellitus with diabetic chronic kidney disease: Secondary | ICD-10-CM | POA: Insufficient documentation

## 2018-08-28 DIAGNOSIS — E785 Hyperlipidemia, unspecified: Secondary | ICD-10-CM

## 2018-08-28 DIAGNOSIS — I2510 Atherosclerotic heart disease of native coronary artery without angina pectoris: Secondary | ICD-10-CM | POA: Insufficient documentation

## 2018-08-28 DIAGNOSIS — Z7189 Other specified counseling: Secondary | ICD-10-CM

## 2018-08-28 LAB — CMP (CANCER CENTER ONLY)
ALT: 52 U/L — ABNORMAL HIGH (ref 0–44)
AST: 18 U/L (ref 15–41)
Albumin: 3.7 g/dL (ref 3.5–5.0)
Alkaline Phosphatase: 85 U/L (ref 38–126)
Anion gap: 9 (ref 5–15)
BUN: 26 mg/dL — ABNORMAL HIGH (ref 8–23)
CO2: 21 mmol/L — ABNORMAL LOW (ref 22–32)
Calcium: 8.7 mg/dL — ABNORMAL LOW (ref 8.9–10.3)
Chloride: 105 mmol/L (ref 98–111)
Creatinine: 1.75 mg/dL — ABNORMAL HIGH (ref 0.44–1.00)
GFR, Est AFR Am: 32 mL/min — ABNORMAL LOW (ref >60)
GFR, Estimated: 28 mL/min — ABNORMAL LOW (ref >60)
Glucose, Bld: 196 mg/dL — ABNORMAL HIGH (ref 70–99)
Potassium: 5.1 mmol/L (ref 3.5–5.1)
Sodium: 135 mmol/L (ref 135–145)
Total Bilirubin: 0.4 mg/dL (ref 0.3–1.2)
Total Protein: 6.6 g/dL (ref 6.5–8.1)

## 2018-08-28 LAB — CBC WITH DIFFERENTIAL/PLATELET
Abs Immature Granulocytes: 0.04 10*3/uL (ref 0.00–0.07)
Basophils Absolute: 0 10*3/uL (ref 0.0–0.1)
Basophils Relative: 0 %
Eosinophils Absolute: 0 10*3/uL (ref 0.0–0.5)
Eosinophils Relative: 0 %
HCT: 34.6 % — ABNORMAL LOW (ref 36.0–46.0)
Hemoglobin: 11.1 g/dL — ABNORMAL LOW (ref 12.0–15.0)
Immature Granulocytes: 0 %
Lymphocytes Relative: 9 %
Lymphs Abs: 0.8 10*3/uL (ref 0.7–4.0)
MCH: 30 pg (ref 26.0–34.0)
MCHC: 32.1 g/dL (ref 30.0–36.0)
MCV: 93.5 fL (ref 80.0–100.0)
Monocytes Absolute: 1.3 10*3/uL — ABNORMAL HIGH (ref 0.1–1.0)
Monocytes Relative: 14 %
Neutro Abs: 6.8 10*3/uL (ref 1.7–7.7)
Neutrophils Relative %: 77 %
Platelets: 232 10*3/uL (ref 150–400)
RBC: 3.7 MIL/uL — ABNORMAL LOW (ref 3.87–5.11)
RDW: 13.6 % (ref 11.5–15.5)
WBC: 9 10*3/uL (ref 4.0–10.5)
nRBC: 0 % (ref 0.0–0.2)

## 2018-08-28 LAB — GLUCOSE, CAPILLARY: Glucose-Capillary: 275 mg/dL — ABNORMAL HIGH (ref 70–99)

## 2018-08-28 MED ORDER — SODIUM CHLORIDE 0.9 % IV SOLN
240.0000 mg | Freq: Once | INTRAVENOUS | Status: AC
  Administered 2018-08-28: 240 mg via INTRAVENOUS
  Filled 2018-08-28: qty 24

## 2018-08-28 MED ORDER — HEPARIN SOD (PORK) LOCK FLUSH 100 UNIT/ML IV SOLN
500.0000 [IU] | Freq: Once | INTRAVENOUS | Status: AC | PRN
  Administered 2018-08-28: 500 [IU]
  Filled 2018-08-28: qty 5

## 2018-08-28 MED ORDER — SODIUM CHLORIDE 0.9% FLUSH
10.0000 mL | INTRAVENOUS | Status: DC | PRN
  Administered 2018-08-28: 10 mL
  Filled 2018-08-28: qty 10

## 2018-08-28 MED ORDER — SODIUM CHLORIDE 0.9 % IV SOLN
Freq: Once | INTRAVENOUS | Status: AC
  Administered 2018-08-28: 13:00:00 via INTRAVENOUS
  Filled 2018-08-28: qty 250

## 2018-08-28 MED ORDER — SODIUM CHLORIDE 0.9 % IV SOLN
INTRAVENOUS | Status: AC
  Administered 2018-08-28: 11:00:00 via INTRAVENOUS
  Filled 2018-08-28 (×2): qty 250

## 2018-08-28 NOTE — Progress Notes (Signed)
Per Dr Irene Limbo proceed with tx with CRT of 1.75

## 2018-08-28 NOTE — Patient Instructions (Addendum)
Dehydration, Adult  Dehydration is a condition in which there is not enough fluid or water in the body. This happens when you lose more fluids than you take in. Important organs, such as the kidneys, brain, and heart, cannot function without a proper amount of fluids. Any loss of fluids from the body can lead to dehydration. Dehydration can range from mild to severe. This condition should be treated right away to prevent it from becoming severe. What are the causes? This condition may be caused by:  Vomiting.  Diarrhea.  Excessive sweating, such as from heat exposure or exercise.  Not drinking enough fluid, especially: ? When ill. ? While doing activity that requires a lot of energy.  Excessive urination.  Fever.  Infection.  Certain medicines, such as medicines that cause the body to lose excess fluid (diuretics).  Inability to access safe drinking water.  Reduced physical ability to get adequate water and food. What increases the risk? This condition is more likely to develop in people:  Who have a poorly controlled long-term (chronic) illness, such as diabetes, heart disease, or kidney disease.  Who are age 65 or older.  Who are disabled.  Who live in a place with high altitude.  Who play endurance sports. What are the signs or symptoms? Symptoms of mild dehydration may include:  Thirst.  Dry lips.  Slightly dry mouth.  Dry, warm skin.  Dizziness. Symptoms of moderate dehydration may include:  Very dry mouth.  Muscle cramps.  Dark urine. Urine may be the color of tea.  Decreased urine production.  Decreased tear production.  Heartbeat that is irregular or faster than normal (palpitations).  Headache.  Light-headedness, especially when you stand up from a sitting position.  Fainting (syncope). Symptoms of severe dehydration may include:  Changes in skin, such as: ? Cold and clammy skin. ? Blotchy (mottled) or pale skin. ? Skin that does  not quickly return to normal after being lightly pinched and released (poor skin turgor).  Changes in body fluids, such as: ? Extreme thirst. ? No tear production. ? Inability to sweat when body temperature is high, such as in hot weather. ? Very little urine production.  Changes in vital signs, such as: ? Weak pulse. ? Pulse that is more than 100 beats a minute when sitting still. ? Rapid breathing. ? Low blood pressure.  Other changes, such as: ? Sunken eyes. ? Cold hands and feet. ? Confusion. ? Lack of energy (lethargy). ? Difficulty waking up from sleep. ? Short-term weight loss. ? Unconsciousness. How is this diagnosed? This condition is diagnosed based on your symptoms and a physical exam. Blood and urine tests may be done to help confirm the diagnosis. How is this treated? Treatment for this condition depends on the severity. Mild or moderate dehydration can often be treated at home. Treatment should be started right away. Do not wait until dehydration becomes severe. Severe dehydration is an emergency and it needs to be treated in a hospital. Treatment for mild dehydration may include:  Drinking more fluids.  Replacing salts and minerals in your blood (electrolytes) that you may have lost. Treatment for moderate dehydration may include:  Drinking an oral rehydration solution (ORS). This is a drink that helps you replace fluids and electrolytes (rehydrate). It can be found at pharmacies and retail stores. Treatment for severe dehydration may include:  Receiving fluids through an IV tube.  Receiving an electrolyte solution through a feeding tube that is passed through your nose and   into your stomach (nasogastric tube, or NG tube).  Correcting any abnormalities in electrolytes.  Treating the underlying cause of dehydration. Follow these instructions at home:  If directed by your health care provider, drink an ORS: ? Make an ORS by following instructions on the  package. ? Start by drinking small amounts, about  cup (120 mL) every 5-10 minutes. ? Slowly increase how much you drink until you have taken the amount recommended by your health care provider.  Drink enough clear fluid to keep your urine clear or pale yellow. If you were told to drink an ORS, finish the ORS first, then start slowly drinking other clear fluids. Drink fluids such as: ? Water. Do not drink only water. Doing that can lead to having too little salt (sodium) in the body (hyponatremia). ? Ice chips. ? Fruit juice that you have added water to (diluted fruit juice). ? Low-calorie sports drinks.  Avoid: ? Alcohol. ? Drinks that contain a lot of sugar. These include high-calorie sports drinks, fruit juice that is not diluted, and soda. ? Caffeine. ? Foods that are greasy or contain a lot of fat or sugar.  Take over-the-counter and prescription medicines only as told by your health care provider.  Do not take sodium tablets. This can lead to having too much sodium in the body (hypernatremia).  Eat foods that contain a healthy balance of electrolytes, such as bananas, oranges, potatoes, tomatoes, and spinach.  Keep all follow-up visits as told by your health care provider. This is important. Contact a health care provider if:  You have abdominal pain that: ? Gets worse. ? Stays in one area (localizes).  You have a rash.  You have a stiff neck.  You are more irritable than usual.  You are sleepier or more difficult to wake up than usual.  You feel weak or dizzy.  You feel very thirsty.  You have urinated only a small amount of very dark urine over 6-8 hours. Get help right away if:  You have symptoms of severe dehydration.  You cannot drink fluids without vomiting.  Your symptoms get worse with treatment.  You have a fever.  You have a severe headache.  You have vomiting or diarrhea that: ? Gets worse. ? Does not go away.  You have blood or green matter  (bile) in your vomit.  You have blood in your stool. This may cause stool to look black and tarry.  You have not urinated in 6-8 hours.  You faint.  Your heart rate while sitting still is over 100 beats a minute.  You have trouble breathing. This information is not intended to replace advice given to you by your health care provider. Make sure you discuss any questions you have with your health care provider. Document Released: 04/08/2005 Document Revised: 11/03/2015 Document Reviewed: 06/02/2015 Elsevier Interactive Patient Education  2019 [place] Discharge Instructions for Patients Receiving Chemotherapy  Today you received the following chemotherapy agents Opdivo.  To help prevent nausea and vomiting after your treatment, we encourage you to take your nausea medication as directed.  If you develop nausea and vomiting that is not controlled by your nausea medication, call the clinic.   BELOW ARE SYMPTOMS THAT SHOULD BE REPORTED IMMEDIATELY:  *FEVER GREATER THAN 100.5 F  *CHILLS WITH OR WITHOUT FEVER  NAUSEA AND VOMITING THAT IS NOT CONTROLLED WITH YOUR NAUSEA MEDICATION  *UNUSUAL SHORTNESS OF BREATH  *UNUSUAL BRUISING OR BLEEDING  TENDERNESS IN MOUTH  AND THROAT WITH OR WITHOUT PRESENCE OF ULCERS  *URINARY PROBLEMS  *BOWEL PROBLEMS  UNUSUAL RASH Items with * indicate a potential emergency and should be followed up as soon as possible.  Feel free to call the clinic should you have any questions or concerns. The clinic phone number is [phone].  Please show the [place] at check-in to the Emergency Department and triage nurse.

## 2018-08-28 NOTE — Telephone Encounter (Signed)
Scheduled appt per 5/8 los. °

## 2018-08-29 ENCOUNTER — Other Ambulatory Visit: Payer: Self-pay

## 2018-08-29 ENCOUNTER — Emergency Department (HOSPITAL_COMMUNITY): Payer: Medicare Other

## 2018-08-29 ENCOUNTER — Inpatient Hospital Stay (HOSPITAL_COMMUNITY)
Admission: EM | Admit: 2018-08-29 | Discharge: 2018-09-04 | DRG: 418 | Disposition: A | Discharge status: Home / Self Care | Payer: Medicare Other | Attending: Internal Medicine | Admitting: Internal Medicine

## 2018-08-29 ENCOUNTER — Encounter (HOSPITAL_COMMUNITY): Payer: Self-pay | Admitting: Student

## 2018-08-29 DIAGNOSIS — M1990 Unspecified osteoarthritis, unspecified site: Secondary | ICD-10-CM | POA: Diagnosis present

## 2018-08-29 DIAGNOSIS — I1310 Hypertensive heart and chronic kidney disease without heart failure, with stage 1 through stage 4 chronic kidney disease, or unspecified chronic kidney disease: Secondary | ICD-10-CM | POA: Diagnosis present

## 2018-08-29 DIAGNOSIS — Z91048 Other nonmedicinal substance allergy status: Secondary | ICD-10-CM | POA: Diagnosis not present

## 2018-08-29 DIAGNOSIS — K623 Rectal prolapse: Secondary | ICD-10-CM | POA: Diagnosis present

## 2018-08-29 DIAGNOSIS — K5909 Other constipation: Secondary | ICD-10-CM | POA: Diagnosis present

## 2018-08-29 DIAGNOSIS — E039 Hypothyroidism, unspecified: Secondary | ICD-10-CM | POA: Diagnosis present

## 2018-08-29 DIAGNOSIS — Z8249 Family history of ischemic heart disease and other diseases of the circulatory system: Secondary | ICD-10-CM

## 2018-08-29 DIAGNOSIS — D630 Anemia in neoplastic disease: Secondary | ICD-10-CM | POA: Diagnosis present

## 2018-08-29 DIAGNOSIS — C642 Malignant neoplasm of left kidney, except renal pelvis: Secondary | ICD-10-CM | POA: Diagnosis not present

## 2018-08-29 DIAGNOSIS — N8110 Cystocele, unspecified: Secondary | ICD-10-CM | POA: Diagnosis present

## 2018-08-29 DIAGNOSIS — K439 Ventral hernia without obstruction or gangrene: Secondary | ICD-10-CM | POA: Diagnosis present

## 2018-08-29 DIAGNOSIS — C7801 Secondary malignant neoplasm of right lung: Secondary | ICD-10-CM | POA: Diagnosis not present

## 2018-08-29 DIAGNOSIS — Z01818 Encounter for other preprocedural examination: Secondary | ICD-10-CM

## 2018-08-29 DIAGNOSIS — Z9889 Other specified postprocedural states: Secondary | ICD-10-CM

## 2018-08-29 DIAGNOSIS — C7800 Secondary malignant neoplasm of unspecified lung: Secondary | ICD-10-CM | POA: Diagnosis present

## 2018-08-29 DIAGNOSIS — Z1159 Encounter for screening for other viral diseases: Secondary | ICD-10-CM

## 2018-08-29 DIAGNOSIS — Z79890 Hormone replacement therapy (postmenopausal): Secondary | ICD-10-CM | POA: Diagnosis not present

## 2018-08-29 DIAGNOSIS — K811 Chronic cholecystitis: Secondary | ICD-10-CM | POA: Diagnosis present

## 2018-08-29 DIAGNOSIS — N189 Chronic kidney disease, unspecified: Secondary | ICD-10-CM | POA: Diagnosis not present

## 2018-08-29 DIAGNOSIS — K5900 Constipation, unspecified: Secondary | ICD-10-CM

## 2018-08-29 DIAGNOSIS — C7971 Secondary malignant neoplasm of right adrenal gland: Secondary | ICD-10-CM | POA: Diagnosis not present

## 2018-08-29 DIAGNOSIS — Z87891 Personal history of nicotine dependence: Secondary | ICD-10-CM | POA: Diagnosis not present

## 2018-08-29 DIAGNOSIS — C649 Malignant neoplasm of unspecified kidney, except renal pelvis: Secondary | ICD-10-CM | POA: Diagnosis present

## 2018-08-29 DIAGNOSIS — K829 Disease of gallbladder, unspecified: Secondary | ICD-10-CM | POA: Diagnosis not present

## 2018-08-29 DIAGNOSIS — E785 Hyperlipidemia, unspecified: Secondary | ICD-10-CM | POA: Diagnosis present

## 2018-08-29 DIAGNOSIS — I700 Atherosclerosis of aorta: Secondary | ICD-10-CM | POA: Diagnosis present

## 2018-08-29 DIAGNOSIS — I2510 Atherosclerotic heart disease of native coronary artery without angina pectoris: Secondary | ICD-10-CM | POA: Diagnosis present

## 2018-08-29 DIAGNOSIS — K8067 Calculus of gallbladder and bile duct with acute and chronic cholecystitis with obstruction: Secondary | ICD-10-CM | POA: Diagnosis present

## 2018-08-29 DIAGNOSIS — R1013 Epigastric pain: Secondary | ICD-10-CM | POA: Diagnosis not present

## 2018-08-29 DIAGNOSIS — R109 Unspecified abdominal pain: Secondary | ICD-10-CM | POA: Diagnosis present

## 2018-08-29 DIAGNOSIS — Z79891 Long term (current) use of opiate analgesic: Secondary | ICD-10-CM

## 2018-08-29 DIAGNOSIS — Z905 Acquired absence of kidney: Secondary | ICD-10-CM

## 2018-08-29 DIAGNOSIS — C7951 Secondary malignant neoplasm of bone: Secondary | ICD-10-CM | POA: Diagnosis not present

## 2018-08-29 DIAGNOSIS — K810 Acute cholecystitis: Secondary | ICD-10-CM | POA: Diagnosis present

## 2018-08-29 DIAGNOSIS — K8021 Calculus of gallbladder without cholecystitis with obstruction: Secondary | ICD-10-CM | POA: Diagnosis not present

## 2018-08-29 DIAGNOSIS — Z95828 Presence of other vascular implants and grafts: Secondary | ICD-10-CM

## 2018-08-29 DIAGNOSIS — K828 Other specified diseases of gallbladder: Secondary | ICD-10-CM | POA: Diagnosis present

## 2018-08-29 DIAGNOSIS — Z888 Allergy status to other drugs, medicaments and biological substances status: Secondary | ICD-10-CM

## 2018-08-29 DIAGNOSIS — E2740 Unspecified adrenocortical insufficiency: Secondary | ICD-10-CM | POA: Diagnosis present

## 2018-08-29 DIAGNOSIS — Z9221 Personal history of antineoplastic chemotherapy: Secondary | ICD-10-CM

## 2018-08-29 DIAGNOSIS — K219 Gastro-esophageal reflux disease without esophagitis: Secondary | ICD-10-CM | POA: Diagnosis present

## 2018-08-29 DIAGNOSIS — R1084 Generalized abdominal pain: Secondary | ICD-10-CM

## 2018-08-29 DIAGNOSIS — I252 Old myocardial infarction: Secondary | ICD-10-CM

## 2018-08-29 DIAGNOSIS — Z79899 Other long term (current) drug therapy: Secondary | ICD-10-CM | POA: Diagnosis not present

## 2018-08-29 DIAGNOSIS — E1122 Type 2 diabetes mellitus with diabetic chronic kidney disease: Secondary | ICD-10-CM | POA: Diagnosis present

## 2018-08-29 DIAGNOSIS — K8040 Calculus of bile duct with cholecystitis, unspecified, without obstruction: Secondary | ICD-10-CM | POA: Diagnosis not present

## 2018-08-29 DIAGNOSIS — K831 Obstruction of bile duct: Secondary | ICD-10-CM | POA: Diagnosis not present

## 2018-08-29 DIAGNOSIS — K429 Umbilical hernia without obstruction or gangrene: Secondary | ICD-10-CM | POA: Diagnosis present

## 2018-08-29 DIAGNOSIS — C7972 Secondary malignant neoplasm of left adrenal gland: Secondary | ICD-10-CM | POA: Diagnosis not present

## 2018-08-29 DIAGNOSIS — E896 Postprocedural adrenocortical (-medullary) hypofunction: Secondary | ICD-10-CM | POA: Diagnosis not present

## 2018-08-29 DIAGNOSIS — N179 Acute kidney failure, unspecified: Secondary | ICD-10-CM | POA: Diagnosis not present

## 2018-08-29 DIAGNOSIS — K760 Fatty (change of) liver, not elsewhere classified: Secondary | ICD-10-CM | POA: Diagnosis present

## 2018-08-29 DIAGNOSIS — K8050 Calculus of bile duct without cholangitis or cholecystitis without obstruction: Secondary | ICD-10-CM

## 2018-08-29 LAB — COMPREHENSIVE METABOLIC PANEL
ALT: 43 U/L (ref 0–44)
AST: 28 U/L (ref 15–41)
Albumin: 3.6 g/dL (ref 3.5–5.0)
Alkaline Phosphatase: 67 U/L (ref 38–126)
Anion gap: 8 (ref 5–15)
BUN: 21 mg/dL (ref 8–23)
CO2: 22 mmol/L (ref 22–32)
Calcium: 8.9 mg/dL (ref 8.9–10.3)
Chloride: 107 mmol/L (ref 98–111)
Creatinine, Ser: 1.66 mg/dL — ABNORMAL HIGH (ref 0.44–1.00)
GFR calc Af Amer: 34 mL/min — ABNORMAL LOW (ref >60)
GFR calc non Af Amer: 30 mL/min — ABNORMAL LOW (ref >60)
Glucose, Bld: 155 mg/dL — ABNORMAL HIGH (ref 70–99)
Potassium: 4.6 mmol/L (ref 3.5–5.1)
Sodium: 137 mmol/L (ref 135–145)
Total Bilirubin: 0.9 mg/dL (ref 0.3–1.2)
Total Protein: 6 g/dL — ABNORMAL LOW (ref 6.5–8.1)

## 2018-08-29 LAB — SARS CORONAVIRUS 2 BY RT PCR (HOSPITAL ORDER, PERFORMED IN ~~LOC~~ HOSPITAL LAB): SARS Coronavirus 2: NEGATIVE

## 2018-08-29 LAB — URINALYSIS, ROUTINE W REFLEX MICROSCOPIC
Bacteria, UA: NONE SEEN
Bilirubin Urine: NEGATIVE
Glucose, UA: 50 mg/dL — AB
Ketones, ur: NEGATIVE mg/dL
Leukocytes,Ua: NEGATIVE
Nitrite: NEGATIVE
Protein, ur: NEGATIVE mg/dL
Specific Gravity, Urine: 1.008 (ref 1.005–1.030)
pH: 5 (ref 5.0–8.0)

## 2018-08-29 LAB — CBC WITH DIFFERENTIAL/PLATELET
Abs Immature Granulocytes: 0.05 10*3/uL (ref 0.00–0.07)
Basophils Absolute: 0.1 10*3/uL (ref 0.0–0.1)
Basophils Relative: 1 %
Eosinophils Absolute: 0.1 10*3/uL (ref 0.0–0.5)
Eosinophils Relative: 1 %
HCT: 36.7 % (ref 36.0–46.0)
Hemoglobin: 11.6 g/dL — ABNORMAL LOW (ref 12.0–15.0)
Immature Granulocytes: 1 %
Lymphocytes Relative: 13 %
Lymphs Abs: 1 10*3/uL (ref 0.7–4.0)
MCH: 30.1 pg (ref 26.0–34.0)
MCHC: 31.6 g/dL (ref 30.0–36.0)
MCV: 95.3 fL (ref 80.0–100.0)
Monocytes Absolute: 1 10*3/uL (ref 0.1–1.0)
Monocytes Relative: 13 %
Neutro Abs: 5.7 10*3/uL (ref 1.7–7.7)
Neutrophils Relative %: 71 %
Platelets: 214 10*3/uL (ref 150–400)
RBC: 3.85 MIL/uL — ABNORMAL LOW (ref 3.87–5.11)
RDW: 14 % (ref 11.5–15.5)
WBC: 7.9 10*3/uL (ref 4.0–10.5)
nRBC: 0 % (ref 0.0–0.2)

## 2018-08-29 LAB — LIPASE, BLOOD: Lipase: 31 U/L (ref 11–51)

## 2018-08-29 MED ORDER — MORPHINE SULFATE (PF) 4 MG/ML IV SOLN
4.0000 mg | Freq: Once | INTRAVENOUS | Status: AC
  Administered 2018-08-29: 4 mg via INTRAVENOUS
  Filled 2018-08-29: qty 1

## 2018-08-29 MED ORDER — AMLODIPINE BESYLATE 5 MG PO TABS
5.0000 mg | ORAL_TABLET | Freq: Every evening | ORAL | Status: DC
  Administered 2018-08-30 – 2018-09-03 (×5): 5 mg via ORAL
  Filled 2018-08-29 (×5): qty 1

## 2018-08-29 MED ORDER — ONDANSETRON HCL 4 MG/2ML IJ SOLN
4.0000 mg | Freq: Once | INTRAMUSCULAR | Status: AC
  Administered 2018-08-29: 4 mg via INTRAVENOUS
  Filled 2018-08-29: qty 2

## 2018-08-29 MED ORDER — CARVEDILOL 6.25 MG PO TABS
6.2500 mg | ORAL_TABLET | Freq: Two times a day (BID) | ORAL | Status: DC
  Administered 2018-08-29 – 2018-09-04 (×12): 6.25 mg via ORAL
  Filled 2018-08-29 (×12): qty 1

## 2018-08-29 MED ORDER — HYDROCORTISONE 5 MG PO TABS: 5.0000 mg | ORAL_TABLET | ORAL | Status: DC

## 2018-08-29 MED ORDER — CIPROFLOXACIN IN D5W 400 MG/200ML IV SOLN
400.0000 mg | INTRAVENOUS | Status: DC
  Administered 2018-08-29 – 2018-09-02 (×5): 400 mg via INTRAVENOUS
  Filled 2018-08-29 (×6): qty 200

## 2018-08-29 MED ORDER — ONDANSETRON HCL 4 MG/2ML IJ SOLN: 4.0000 mg | Freq: Four times a day (QID) | INTRAMUSCULAR | Status: DC | PRN

## 2018-08-29 MED ORDER — DEXTROSE-NACL 5-0.9 % IV SOLN
INTRAVENOUS | Status: DC
  Administered 2018-08-29 – 2018-09-02 (×6): via INTRAVENOUS

## 2018-08-29 MED ORDER — OXYCODONE HCL 5 MG PO TABS
5.0000 mg | ORAL_TABLET | Freq: Four times a day (QID) | ORAL | Status: DC | PRN
  Administered 2018-08-29 – 2018-09-03 (×8): 5 mg via ORAL
  Filled 2018-08-29 (×11): qty 1

## 2018-08-29 MED ORDER — ONDANSETRON HCL 4 MG PO TABS: 4.0000 mg | ORAL_TABLET | Freq: Four times a day (QID) | ORAL | Status: DC | PRN

## 2018-08-29 MED ORDER — FENTANYL CITRATE (PF) 100 MCG/2ML IJ SOLN
50.0000 ug | Freq: Once | INTRAMUSCULAR | Status: AC
  Administered 2018-08-29: 50 ug via INTRAVENOUS
  Filled 2018-08-29: qty 2

## 2018-08-29 MED ORDER — HYDROCORTISONE 5 MG PO TABS
5.0000 mg | ORAL_TABLET | Freq: Every evening | ORAL | Status: DC
  Administered 2018-08-29 – 2018-09-03 (×6): 5 mg via ORAL
  Filled 2018-08-29 (×7): qty 1

## 2018-08-29 MED ORDER — HYDROCORTISONE 10 MG PO TABS
10.0000 mg | ORAL_TABLET | Freq: Every day | ORAL | Status: DC
  Administered 2018-08-30 – 2018-09-04 (×6): 10 mg via ORAL
  Filled 2018-08-29 (×7): qty 1

## 2018-08-29 MED ORDER — ATORVASTATIN CALCIUM 40 MG PO TABS
40.0000 mg | ORAL_TABLET | Freq: Every day | ORAL | Status: DC
  Administered 2018-08-30 – 2018-09-03 (×5): 40 mg via ORAL
  Filled 2018-08-29 (×5): qty 1

## 2018-08-29 MED ORDER — HYDROMORPHONE HCL 1 MG/ML IJ SOLN
0.5000 mg | INTRAMUSCULAR | Status: DC | PRN
  Administered 2018-08-29 – 2018-09-03 (×7): 0.5 mg via INTRAVENOUS
  Filled 2018-08-29 (×7): qty 1

## 2018-08-29 MED ORDER — PANTOPRAZOLE SODIUM 40 MG PO TBEC
40.0000 mg | DELAYED_RELEASE_TABLET | Freq: Every day | ORAL | Status: DC
  Administered 2018-08-29 – 2018-09-04 (×6): 40 mg via ORAL
  Filled 2018-08-29 (×6): qty 1

## 2018-08-29 MED ORDER — HEPARIN SODIUM (PORCINE) 5000 UNIT/ML IJ SOLN
5000.0000 [IU] | Freq: Three times a day (TID) | INTRAMUSCULAR | Status: AC
  Administered 2018-08-29: 5000 [IU] via SUBCUTANEOUS
  Filled 2018-08-29 (×4): qty 1

## 2018-08-29 MED ORDER — POLYETHYLENE GLYCOL 3350 17 G PO PACK
17.0000 g | PACK | Freq: Every day | ORAL | Status: DC
  Administered 2018-08-29: 17 g via ORAL
  Filled 2018-08-29: qty 1

## 2018-08-29 MED ORDER — LEVOTHYROXINE SODIUM 50 MCG PO TABS
50.0000 ug | ORAL_TABLET | Freq: Every day | ORAL | Status: DC
  Administered 2018-08-30 – 2018-09-04 (×6): 50 ug via ORAL
  Filled 2018-08-29 (×6): qty 1

## 2018-08-29 MED ORDER — HYDROMORPHONE HCL 1 MG/ML IJ SOLN: 0.5000 mg | Freq: Once | INTRAMUSCULAR | Status: DC

## 2018-08-29 MED ORDER — METRONIDAZOLE IN NACL 5-0.79 MG/ML-% IV SOLN
500.0000 mg | Freq: Three times a day (TID) | INTRAVENOUS | Status: DC
  Administered 2018-08-29 – 2018-09-03 (×14): 500 mg via INTRAVENOUS
  Filled 2018-08-29 (×14): qty 100

## 2018-08-29 NOTE — H&P (Signed)
History and Physical   Audrey [name] QAS:[id number] DOB: 04/11/1941 DOA: 08/29/2018  Referring MD/NP/PA: Dr. Jeanell [name]  PCP: Audrey Greek, MD   Outpatient Specialists: [name],  oncology, Deputy GI- Dr. Rush Landmark  Patient coming from: Home  Chief Complaint: Abdominal pain  HPI: Audrey [name] is a 78 y.o. 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  Patient's ERCP is way down 10 3 on April 28 April 2 and May 7 of this year.  She also had biliary sphincterotomy had stent which is been removed also balloon and brush dilatation with cytology multiple procedures.  She has been on ciprofloxacin but not doing much better.  Today however GI has recommended laparoscopic cholecystectomy and surgery has been consulted.  Patient is being admitted to medical service for this procedure..  ED Course: Temperature is 98.9 blood pressure 171/74, pulse is 60 respiratory rate of 18 oxygen sat 98% room air.  White count 7.9 hemoglobin 11.6 and platelets 214.  BUN 20 creatinine 1.66 glucose 155.  Urinalysis essentially negative.  COVID-19 screening is negative.  CT abdomen pelvis showed no definitive acute findings.  Status post biliary ductal stenting with biliary ductal dilatation and pneumobilia there were gallstones in the gallbladder.  She had large burden of dense stool in the colon with diverticulosis.  Right upper quadrant abdominal ultrasound showed cholelithiasis with changes of common bile duct dilatation.  Patient is being admitted for acute treatment and possible cholecystectomy.  Review of Systems: As  per HPI otherwise 10 point review of systems negative.    Past Medical History:  Diagnosis Date  . Arthritis    lower back, hips, hands  . Cancer [facility])    renal cancer - left kidney removed, pill chemo x 1 yr  . Diabetes mellitus ([place])    type 2 - no meds, diet controlled  . Early cataracts, bilateral   . Elevated liver enzymes   . GERD (gastroesophageal reflux disease)    occasional - diet controlled  . History of hiatal hernia   . HTN (hypertension)   . Hyperlipidemia   . Hypothyroidism   . Myocardial infarction ([place]) 1991   no deficits  . SVD (spontaneous vaginal delivery)    x 3  . Wears glasses     Past Surgical History:  Procedure Laterality Date  . BALLOON DILATION N/A 07/23/2018   Procedure: BALLOON DILATION;  Surgeon: Rush Landmark Telford Nab., MD;  Location: [place];  Service: Gastroenterology;  Laterality: N/A;  . BILIARY BRUSHING  08/10/2018   Procedure: BILIARY BRUSHING;  Surgeon: Rush Landmark Telford Nab., MD;  Location: [place];  Service: Gastroenterology;;  . BILIARY DILATION  08/10/2018   Procedure: BILIARY DILATION;  Surgeon: Irving Copas., MD;  Location: [place];  Service: Gastroenterology;;  . BILIARY STENT PLACEMENT  08/10/2018   Procedure: BILIARY STENT PLACEMENT;  Surgeon: Irving Copas., MD;  Location: [place];  Service: Gastroenterology;;  . BIOPSY  07/23/2018   Procedure: BIOPSY;  Surgeon: Irving Copas., MD;  Location: [place];  Service: Gastroenterology;;  . COLONOSCOPY     normal   . ENDOSCOPIC RETROGRADE CHOLANGIOPANCREATOGRAPHY (ERCP) WITH PROPOFOL N/A 08/10/2018   Procedure: ENDOSCOPIC RETROGRADE CHOLANGIOPANCREATOGRAPHY (ERCP)  WITH PROPOFOL;  Surgeon: Mansouraty, Telford Nab., MD;  Location: [place];  Service: Gastroenterology;  Laterality: N/A;  . ERCP N/A 07/23/2018   Procedure: ENDOSCOPIC RETROGRADE CHOLANGIOPANCREATOGRAPHY (ERCP);  Surgeon: Irving Copas., MD;  Location: [place];   Service: Gastroenterology;  Laterality: N/A;  . IR IMAGING GUIDED PORT INSERTION  01/08/2018  . LAPAROSCOPIC NEPHRECTOMY Left 08/01/2016   Procedure: LAPAROSCOPIC  RADICAL NEPHRECTOMY/ REPAIR OF UMBILICAL HERNIA;  Surgeon: Raynelle Bring, MD;  Location: [facility];  Service: Urology;  Laterality: Left;  . REMOVAL OF STONES  07/23/2018   Procedure: REMOVAL OF GALL STONES;  Surgeon: Rush Landmark Telford Nab., MD;  Location: [place];  Service: Gastroenterology;;  . REMOVAL OF STONES  08/10/2018   Procedure: REMOVAL OF STONES;  Surgeon: Irving Copas., MD;  Location: [place];  Service: Gastroenterology;;  . [name]  07/23/2018   Procedure: [name];  Surgeon: Irving Copas., MD;  Location: [place];  Service: Gastroenterology;;  . UPPER GI ENDOSCOPY     x 1     reports that she quit smoking about 54 years ago. Her smoking use included cigarettes. She has a 8.00 pack-year smoking history. She has never used smokeless tobacco. She reports that she does not drink alcohol or use drugs.  Allergies  Allergen Reactions  . Adhesive [Tape] Other (See Comments)    Tears skin off Paper tape is ok per patient  . Nsaids Other (See Comments)    Liver damage  . Statins Other (See Comments)    Liver/kidney issues.    Family History  Problem Relation Age of Onset  . Heart attack Father 10  . Heart disease Brother 1       CABG  . Colon cancer Neg Hx   . Esophageal cancer Neg Hx   . Inflammatory bowel disease Neg Hx   . Liver disease Neg Hx   . Pancreatic cancer Neg Hx   . Rectal cancer Neg Hx   . Stomach cancer Neg Hx      Prior to Admission medications   Medication Sig Start Date End Date Taking? Authorizing Provider  acetaminophen (TYLENOL) 500 MG tablet Take 1,000 mg by mouth 2 (two) times daily.     [provider]  amLODipine (NORVASC) 5 MG tablet Take 5 mg by mouth every evening.  09/01/17   [provider]  atorvastatin (LIPITOR) 40 MG  tablet Take 40 mg by mouth daily at 6 PM.     [provider]  Calcium 600-200 MG-UNIT tablet Take 1 tablet by mouth daily at 12 noon.     [provider]  carvedilol (COREG) 6.25 MG tablet Take 6.25 mg by mouth 2 (two) times daily. 09/01/17   [provider]  ciprofloxacin (CIPRO) 500 MG tablet Take 1 tablet (500 mg total) by mouth 2 (two) times daily for 3 days. 08/27/18 08/30/18  Mansouraty, Telford Nab., MD  clobetasol ointment (TEMOVATE) 7.42 % Apply 1 application topically 2 (two) times daily as needed (for skin irritation).     [provider]  denosumab (XGEVA) 120 MG/1.7ML SOLN injection Inject 120 mg into the skin every 6 (six) weeks.    [provider]  hydrocortisone (CORTEF) 10 MG tablet Take 5-10 mg by mouth See admin instructions. Take 1 tablet (10 mg) by mouth in the morning & 0.5 tablet (5 mg) by mouth in the evening.    [provider]  levothyroxine (SYNTHROID, LEVOTHROID) 50 MCG tablet Take 50 mcg by mouth daily before breakfast.  [provider]  lidocaine-prilocaine (EMLA) cream Apply to affected area once Patient taking differently: Apply 1 application topically daily as needed (prior to port access).  12/26/17   Brunetta Genera, MD  Nivolumab (OPDIVO IV) Inject 1 Dose into the vein every 14 (fourteen) days.    [provider]  omeprazole (PRILOSEC) 20 MG capsule Take 1 capsule (20 mg total) by mouth 2 (two) times daily. 07/23/18 07/23/19  Mansouraty, Telford Nab., MD  oxyCODONE (OXY IR/ROXICODONE) 5 MG immediate release tablet Take 1 tablet (5 mg total) by mouth every 6 (six) hours as needed for severe pain. 08/20/18   Mansouraty, Telford Nab., MD  senna-docusate (SENNA S) 8.6-50 MG tablet Take 2 tablets by mouth at bedtime. 07/31/18   Brunetta Genera, MD    Physical Exam: Vitals:   08/29/18 1423 08/29/18 1615 08/29/18 1815 08/29/18 1900  BP: (!) 148/77     Pulse: 60 (!) 50 (!) 56 (!) 59  Resp: 18      Temp:      TempSrc:      SpO2: 99% 99% 99% 98%  Weight:          Constitutional: NAD, calm, comfortable Vitals:   08/29/18 1423 08/29/18 1615 08/29/18 1815 08/29/18 1900  BP: (!) 148/77     Pulse: 60 (!) 50 (!) 56 (!) 59  Resp: 18     Temp:      TempSrc:      SpO2: 99% 99% 99% 98%  Weight:       Eyes: PERRL, lids and conjunctivae normal ENMT: Mucous membranes are moist. Posterior pharynx clear of any exudate or lesions.Normal dentition.  Neck: normal, supple, no masses, no thyromegaly Respiratory: clear to auscultation bilaterally, no wheezing, no crackles. Normal respiratory effort. No accessory muscle use.  Cardiovascular: Regular rate and rhythm, no murmurs / rubs / gallops. No extremity edema. 2+ pedal pulses. No carotid bruits.  Abdomen: Right upper quadrant abdominal tenderness, no masses palpated. No hepatosplenomegaly. Bowel sounds positive.  Musculoskeletal: no clubbing / cyanosis. No joint deformity upper and lower extremities. Good ROM, no contractures. Normal muscle tone.  Skin: no rashes, lesions, ulcers. No induration Neurologic: CN 2-12 grossly intact. Sensation intact, DTR normal. Strength 5/5 in all 4.  Psychiatric: Normal judgment and insight. Alert and oriented x 3. Normal mood.     Labs on Admission: I have personally reviewed following labs and imaging studies  CBC: Recent Labs  Lab 08/28/18 1017 08/29/18 1430  WBC 9.0 7.9  NEUTROABS 6.8 5.7  HGB 11.1* 11.6*  HCT 34.6* 36.7  MCV 93.5 95.3  PLT 232 254   Basic Metabolic Panel: Recent Labs  Lab 08/28/18 1017 08/29/18 1430  NA 135 137  K 5.1 4.6  CL 105 107  CO2 21* 22  GLUCOSE 196* 155*  BUN 26* 21  CREATININE 1.75* 1.66*  CALCIUM 8.7* 8.9   GFR: Estimated Creatinine Clearance: 22.8 mL/min (A) (by C-G formula based on SCr of 1.66 mg/dL (H)). Liver Function Tests: Recent Labs  Lab 08/28/18 1017 08/29/18 1430  AST 18 28  ALT 52* 43  ALKPHOS 85 67  BILITOT 0.4 0.9  PROT 6.6 6.0*   ALBUMIN 3.7 3.6   Recent Labs  Lab 08/29/18 1430  LIPASE 31   No results for input(s): AMMONIA in the last 168 hours. Coagulation Profile: No results for input(s): INR, PROTIME in the last 168 hours. Cardiac Enzymes: No results for input(s): CKTOTAL, CKMB, CKMBINDEX, TROPONINI in the last 168 hours.  BNP (last 3 results) No results for input(s): PROBNP in the last 8760 hours. HbA1C: No results for input(s): HGBA1C in the last 72 hours. CBG: Recent Labs  Lab 08/27/18 1526 08/27/18 1557 08/27/18 1647  GLUCAP 305* 333* 275*   Lipid Profile: No results for input(s): CHOL, HDL, LDLCALC, TRIG, CHOLHDL, LDLDIRECT in the last 72 hours. Thyroid Function Tests: No results for input(s): TSH, T4TOTAL, FREET4, T3FREE, THYROIDAB in the last 72 hours. Anemia Panel: No results for input(s): VITAMINB12, FOLATE, FERRITIN, TIBC, IRON, RETICCTPCT in the last 72 hours. Urine analysis:    Component Value Date/Time   COLORURINE STRAW (A) 08/29/2018 1415   APPEARANCEUR CLEAR 08/29/2018 1415   LABSPEC 1.008 08/29/2018 1415   LABSPEC 1.010 07/18/2016 1609   PHURINE 5.0 08/29/2018 1415   GLUCOSEU 50 (A) 08/29/2018 1415   GLUCOSEU Negative 07/18/2016 1609   HGBUR SMALL (A) 08/29/2018 1415   BILIRUBINUR NEGATIVE 08/29/2018 1415   BILIRUBINUR Negative 07/18/2016 1609   KETONESUR NEGATIVE 08/29/2018 1415   PROTEINUR NEGATIVE 08/29/2018 1415   UROBILINOGEN 0.2 07/18/2016 1609   NITRITE NEGATIVE 08/29/2018 1415   LEUKOCYTESUR NEGATIVE 08/29/2018 1415   LEUKOCYTESUR Moderate 07/18/2016 1609   Sepsis Labs: @LABRCNTIP (procalcitonin:4,lacticidven:4) )No results found for this or any previous visit (from the past 240 hour(s)).   Radiological Exams on Admission: Ct Abdomen Pelvis Wo Contrast  Result Date: 08/29/2018 CLINICAL DATA:  Generalized abdominal pain EXAM: CT ABDOMEN AND PELVIS WITHOUT CONTRAST TECHNIQUE: Multidetector CT imaging of the abdomen and pelvis was performed following the standard  protocol without IV contrast. COMPARISON:  PET-CT, 04/03/2018 FINDINGS: Lower chest: No acute abnormality.  Coronary artery calcifications. Hepatobiliary: No focal liver abnormality is seen. Status post biliary duct stenting with biliary ductal dilatation and pneumobilia. Gallstones in the gallbladder. Pancreas: Unremarkable. No pancreatic ductal dilatation or surrounding inflammatory changes. Spleen: Normal in size without focal abnormality. Adrenals/Urinary Tract: Adrenal glands are unremarkable. Status post left nephrectomy. Bladder is unremarkable. Stomach/Bowel: Stomach is within normal limits. Appendix appears normal. No evidence of bowel wall thickening, distention, or inflammatory changes. Large burden of dense stool in the colon. Pancolonic diverticulosis. Very redundant sigmoid colon. Vascular/Lymphatic: Calcific atherosclerosis. No enlarged abdominal or pelvic lymph nodes. Reproductive: No mass or other abnormality. Other: No abdominal wall hernia or abnormality. No abdominopelvic ascites. Musculoskeletal: No acute or significant osseous findings. IMPRESSION: 1. No definite acute non-contrast CT findings to explain abdominal pain. 2. Status post biliary ductal stenting with biliary ductal dilatation and pneumobilia. Gallstones in the gallbladder. 3. Large burden of dense stool in the colon. Pancolonic diverticulosis. Very redundant sigmoid colon. 4.  Status post left nephrectomy. Electronically Signed   By: Eddie Candle M.D.   On: 08/29/2018 16:04   US Abdomen Limited Ruq  Result Date: 08/29/2018 CLINICAL DATA:  Abdominal pain EXAM: ULTRASOUND ABDOMEN LIMITED RIGHT UPPER QUADRANT COMPARISON:  CT from earlier the same. FINDINGS: Gallbladder: Gallbladder is well distended with multiple gallstones within. Wall thickness is at the upper limits of normal. Common bile duct: Stent is noted within the common bile duct. It is true diameter is difficult to assess due to the stent placement Liver: No focal lesion  identified. Within normal limits in parenchymal echogenicity. Portal vein is patent on color Doppler imaging with normal direction of blood flow towards the liver. IMPRESSION: Common bile duct stents in place. No definitive biliary ductal dilatation is seen. The changes on the prior CT likely related to incomplete opacification of portal vein. Cholelithiasis with upper limits normal wall thickening Electronically Signed  By: Inez Catalina M.D.   On: 08/29/2018 17:26    Assessment/Plan Principal Problem:   Acute cholecystitis Active Problems:   Renal cell carcinoma (HCC)   Port-A-Cath in place   History of ERCP   Dyslipidemia   Biliary stricture     #1 acute cholecystitis with cholelithiasis: Patient will be admitted to the medical service.  Surgery and GI already consulted.  We will keep her n.p.o. except for sips and meds for now.  Most likely n.p.o. after midnight for laparoscopic cholecystectomy.  Pain control and nausea control.  Further care per surgery.  #2 history of renal cell carcinoma: Following up with oncology.  Patient has just completed chemotherapy.  Continue per oncology.  #3 hypertension: Continue amlodipine and Coreg.  #4 hyperlipidemia: Continue atorvastatin  #5 GERD: Continue with PPIs   DVT prophylaxis: Heparin Code Status: Full code Family Communication: Discussed care with patient fully Disposition Plan: Home Consults called: Dr. Brantley Stage of general surgery, [facility] gastroenterology Admission status: Inpatient  Severity of Illness: The appropriate patient status for this patient is INPATIENT. Inpatient status is judged to be reasonable and necessary in order to provide the required intensity of service to ensure the patient's safety. The patient's presenting symptoms, physical exam findings, and initial radiographic and laboratory data in the context of their chronic comorbidities is felt to place them at high risk for further clinical deterioration. Furthermore, it  is not anticipated that the patient will be medically stable for discharge from the hospital within 2 midnights of admission. The following factors support the patient status of inpatient.   " The patient's presenting symptoms include abdominal pain with some nausea. " The worrisome physical exam findings include right upper quadrant abdominal tenderness. " The initial radiographic and laboratory data are worrisome because of ultrasound findings of cholelithiasis. 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   * I certify that at the point of admission it is my clinical judgment that the patient will require inpatient hospital care spanning beyond 2 midnights from the point of admission due to high intensity of service, high risk for further deterioration and high frequency of surveillance required.Barbette Merino MD [facility] Pager [phone]  If 7PM-7AM, please contact night-coverage [email or web address] Password [email or web address]  08/29/2018, 7:04 PM

## 2018-08-29 NOTE — ED Notes (Signed)
Patient transported to Ultrasound 

## 2018-08-29 NOTE — ED Triage Notes (Signed)
Pt in with generalized abd pain, worse since this am. Denies any n/v, does have hx of Renal CA and is currently on immunotherapy, last chemo trx yesterday. Multiple ERCP's done last mo, denies any fever, cough or cp.

## 2018-08-29 NOTE — ED Notes (Signed)
Pt back from US

## 2018-08-29 NOTE — Consult Note (Signed)
Referring Provider: Dr. Pattricia Boss Primary Care Physician:  [name], MD Primary Gastroenterologist:  Dr.Mansouraty   Reason for Consultation: abdominal pain  HPI: Audrey [name] is a 78 y.o. female with a past medical history of renal cancer, status post a nephrectomy on immunotherapy, diabetes mellitus, GERD, hypertension, CAD, MI 33 and gallstones.  She is undergone gone 3 ERCPs due to choledocholithiasis with placement of multiple common bile duct stent within the past month (4/2, 4/20 and 08/27/2018 by Dr. Rush Landmark. Her third ERCP was on 08/2018, two plastic biliary stents were placed into the common bile duct to traverse the narrowing at the distal duct. Follow-up labs showed improving LFTs.  He called the answering service this morning with complaints of severe 10/10 upper and lower abdominal pain. She described the pain under the right and left rib cage which extended throughout the entire lower abdomen.  No fever, sweats or chills.  No bowel movement for the past 48 hours.  No nausea or vomiting.  He reports having similar upper and lower abdominal pain over the past month but today the pain was the worst it has ever been.  He was sent directly to the emergency room for stat labs and abdominal/pelvic CT without contrast.  Labs in the ED: Sodium 137.  Potassium 4.6.  BUN 21.  Creatinine 1.66.  Alk phos 67.  Albumin 3.6.  Lipase 31.  AST 188.  ALT 43.  Total bili 0.9.  WBC 7.9.  Hemoglobin 11.6.  Hematocrit 36.7.  Abdominal/pelvic CT without contrast: Large burden of dense stool in the colon, diverticulosis.  No definite acute findings to explain her abdominal pain.  Status post biliary ductal stenting with biliary ductal dilatation and  pneumobilia.  Gallstones in the gallbladder.  Abdominal sonogram: Biliary duct dilatation with 8 mm calculus at the level of the ampulla in the distal common bile duct.Cholelithiasis. No gallbladder wall thickening or pericholecystic fluid. Appearance of  the liver raises concern for underlying hepatic cirrhosis. No focal liver lesions are demonstrable.  She received IV Morphine in the ED, continues to complain of significant abdominal pain. Surgical consult recommended for possible cholecystectomy.  Past Medical History:  Diagnosis Date  . Arthritis    lower back, hips, hands  . Cancer [facility])    renal cancer - left kidney removed, pill chemo x 1 yr  . Diabetes mellitus ([place])    type 2 - no meds, diet controlled  . Early cataracts, bilateral   . Elevated liver enzymes   . GERD (gastroesophageal reflux disease)    occasional - diet controlled  . History of hiatal hernia   . HTN (hypertension)   . Hyperlipidemia   . Hypothyroidism   . Myocardial infarction ([place]) 1991   no deficits  . SVD (spontaneous vaginal delivery)    x 3  . Wears glasses     Past Surgical History:  Procedure Laterality Date  . BALLOON DILATION N/A 07/23/2018   Procedure: BALLOON DILATION;  Surgeon: Rush Landmark Telford Nab., MD;  Location: [place];  Service: Gastroenterology;  Laterality: N/A;  . BILIARY BRUSHING  08/10/2018   Procedure: BILIARY BRUSHING;  Surgeon: Rush Landmark Telford Nab., MD;  Location: [place];  Service: Gastroenterology;;  . BILIARY DILATION  08/10/2018   Procedure: BILIARY DILATION;  Surgeon: Irving Copas., MD;  Location: [place];  Service: Gastroenterology;;  . BILIARY STENT PLACEMENT  08/10/2018   Procedure: BILIARY STENT PLACEMENT;  Surgeon: Irving Copas., MD;  Location: [place];  Service: Gastroenterology;;  .  BIOPSY  07/23/2018   Procedure: BIOPSY;  Surgeon: Rush Landmark Telford Nab., MD;  Location: [place];  Service: Gastroenterology;;  . COLONOSCOPY     normal   . ENDOSCOPIC RETROGRADE CHOLANGIOPANCREATOGRAPHY (ERCP) WITH PROPOFOL N/A 08/10/2018   Procedure: ENDOSCOPIC RETROGRADE CHOLANGIOPANCREATOGRAPHY (ERCP) WITH PROPOFOL;  Surgeon: Irving Copas., MD;  Location: [place];  Service:  Gastroenterology;  Laterality: N/A;  . ERCP N/A 07/23/2018   Procedure: ENDOSCOPIC RETROGRADE CHOLANGIOPANCREATOGRAPHY (ERCP);  Surgeon: Irving Copas., MD;  Location: [place];  Service: Gastroenterology;  Laterality: N/A;  . IR IMAGING GUIDED PORT INSERTION  01/08/2018  . LAPAROSCOPIC NEPHRECTOMY Left 08/01/2016   Procedure: LAPAROSCOPIC  RADICAL NEPHRECTOMY/ REPAIR OF UMBILICAL HERNIA;  Surgeon: Raynelle Bring, MD;  Location: [facility];  Service: Urology;  Laterality: Left;  . REMOVAL OF STONES  07/23/2018   Procedure: REMOVAL OF GALL STONES;  Surgeon: Rush Landmark Telford Nab., MD;  Location: [place];  Service: Gastroenterology;;  . REMOVAL OF STONES  08/10/2018   Procedure: REMOVAL OF STONES;  Surgeon: Irving Copas., MD;  Location: [place];  Service: Gastroenterology;;  . [name]  07/23/2018   Procedure: [name];  Surgeon: Irving Copas., MD;  Location: [place];  Service: Gastroenterology;;  . UPPER GI ENDOSCOPY     x 1    Prior to Admission medications   Medication Sig Start Date End Date Taking? Authorizing Provider  acetaminophen (TYLENOL) 500 MG tablet Take 1,000 mg by mouth 2 (two) times daily.     [provider]  amLODipine (NORVASC) 5 MG tablet Take 5 mg by mouth every evening.  09/01/17   [provider]  atorvastatin (LIPITOR) 40 MG tablet Take 40 mg by mouth daily at 6 PM.     [provider]  Calcium 600-200 MG-UNIT tablet Take 1 tablet by mouth daily at 12 noon.     [provider]  carvedilol (COREG) 6.25 MG tablet Take 6.25 mg by mouth 2 (two) times daily. 09/01/17   [provider]  ciprofloxacin (CIPRO) 500 MG tablet Take 1 tablet (500 mg total) by mouth 2 (two) times daily for 3 days. 08/27/18 08/30/18  Mansouraty, Telford Nab., MD  clobetasol ointment (TEMOVATE) 5.42 % Apply 1 application topically 2 (two) times daily as needed (for skin irritation).     [provider]   denosumab (XGEVA) 120 MG/1.7ML SOLN injection Inject 120 mg into the skin every 6 (six) weeks.    [provider]  hydrocortisone (CORTEF) 10 MG tablet Take 5-10 mg by mouth See admin instructions. Take 1 tablet (10 mg) by mouth in the morning & 0.5 tablet (5 mg) by mouth in the evening.    [provider]  levothyroxine (SYNTHROID, LEVOTHROID) 50 MCG tablet Take 50 mcg by mouth daily before breakfast.     [provider]  lidocaine-prilocaine (EMLA) cream Apply to affected area once Patient taking differently: Apply 1 application topically daily as needed (prior to port access).  12/26/17   Brunetta Genera, MD  Nivolumab (OPDIVO IV) Inject 1 Dose into the vein every 14 (fourteen) days.    [provider]  omeprazole (PRILOSEC) 20 MG capsule Take 1 capsule (20 mg total) by mouth 2 (two) times daily. 07/23/18 07/23/19  Mansouraty, Telford Nab., MD  oxyCODONE (OXY IR/ROXICODONE) 5 MG immediate release tablet Take 1 tablet (5 mg total) by mouth every 6 (six) hours as needed for severe pain. 08/20/18   Mansouraty, Telford Nab., MD  senna-docusate (SENNA S) 8.6-50 MG tablet  Take 2 tablets by mouth at bedtime. 07/31/18   Brunetta Genera, MD    Current Facility-Administered Medications  Medication Dose Route Frequency Provider Last Rate Last Dose  . polyethylene glycol (MIRALAX / GLYCOLAX) packet 17 g  17 g Oral Daily Petrucelli, Samantha R, PA-C   17 g at 08/29/18 1657   Current Outpatient Medications  Medication Sig Dispense Refill  . acetaminophen (TYLENOL) 500 MG tablet Take 1,000 mg by mouth 2 (two) times daily.     Marland Kitchen amLODipine (NORVASC) 5 MG tablet Take 5 mg by mouth every evening.   6  . atorvastatin (LIPITOR) 40 MG tablet Take 40 mg by mouth daily at 6 PM.     . Calcium 600-200 MG-UNIT tablet Take 1 tablet by mouth daily at 12 noon.     . carvedilol (COREG) 6.25 MG tablet Take 6.25 mg by mouth 2 (two) times daily.  6  . ciprofloxacin (CIPRO) 500 MG tablet  Take 1 tablet (500 mg total) by mouth 2 (two) times daily for 3 days. 6 tablet 0  . clobetasol ointment (TEMOVATE) 4.85 % Apply 1 application topically 2 (two) times daily as needed (for skin irritation).     Marland Kitchen denosumab (XGEVA) 120 MG/1.7ML SOLN injection Inject 120 mg into the skin every 6 (six) weeks.    . hydrocortisone (CORTEF) 10 MG tablet Take 5-10 mg by mouth See admin instructions. Take 1 tablet (10 mg) by mouth in the morning & 0.5 tablet (5 mg) by mouth in the evening.    Marland Kitchen levothyroxine (SYNTHROID, LEVOTHROID) 50 MCG tablet Take 50 mcg by mouth daily before breakfast.     . lidocaine-prilocaine (EMLA) cream Apply to affected area once (Patient taking differently: Apply 1 application topically daily as needed (prior to port access). ) 30 g 3  . Nivolumab (OPDIVO IV) Inject 1 Dose into the vein every 14 (fourteen) days.    Marland Kitchen omeprazole (PRILOSEC) 20 MG capsule Take 1 capsule (20 mg total) by mouth 2 (two) times daily. 60 capsule 2  . oxyCODONE (OXY IR/ROXICODONE) 5 MG immediate release tablet Take 1 tablet (5 mg total) by mouth every 6 (six) hours as needed for severe pain. 30 tablet 0  . senna-docusate (SENNA S) 8.6-50 MG tablet Take 2 tablets by mouth at bedtime. 60 tablet 1    Allergies as of 08/29/2018 - Review Complete 08/29/2018  Allergen Reaction Noted  . Adhesive [tape] Other (See Comments) 06/30/2016  . Nsaids Other (See Comments) 06/30/2016  . Statins Other (See Comments)     Family History  Problem Relation Age of Onset  . Heart attack Father 52  . Heart disease Brother 38       CABG  . Colon cancer Neg Hx   . Esophageal cancer Neg Hx   . Inflammatory bowel disease Neg Hx   . Liver disease Neg Hx   . Pancreatic cancer Neg Hx   . Rectal cancer Neg Hx   . Stomach cancer Neg Hx     Social History   Socioeconomic History  . Marital status: Married    Spouse name: Not on file  . Number of children: 3  . Years of education: Not on file  . Highest education  level: Not on file  Occupational History  . Occupation: retired  Scientific laboratory technician  . Financial resource strain: Not on file  . Food insecurity:    Worry: Not on file    Inability: Not on file  . Transportation needs:  Medical: Not on file    Non-medical: Not on file  Tobacco Use  . Smoking status: Former Smoker    Packs/day: 1.00    Years: 8.00    Pack years: 8.00    Types: Cigarettes    Last attempt to quit: 06/18/1964    Years since quitting: 54.2  . Smokeless tobacco: Never Used  Substance and Sexual Activity  . Alcohol use: No  . Drug use: No  . Sexual activity: Not on file  Lifestyle  . Physical activity:    Days per week: Not on file    Minutes per session: Not on file  . Stress: Not on file  Relationships  . Social connections:    Talks on phone: Not on file    Gets together: Not on file    Attends religious service: Not on file    Active member of club or organization: Not on file    Attends meetings of clubs or organizations: Not on file    Relationship status: Not on file  . Intimate partner violence:    Fear of current or ex partner: Not on file    Emotionally abused: Not on file    Physically abused: Not on file    Forced sexual activity: Not on file  Other Topics Concern  . Not on file  Social History Narrative   Married.  Three children    Review of Systems: See HPI, all other systems negative   Physical Exam: Vital signs in last 24 hours: Temp:  [97.8 F (36.6 C)] 97.8 F (36.6 C) (05/09 1316) Pulse Rate:  [50-60] 50 (05/09 1615) Resp:  [10-18] 18 (05/09 1423) BP: (147-171)/(62-77) 148/77 (05/09 1423) SpO2:  [99 %-100 %] 99 % (05/09 1615) Weight:  [60.5 kg] 60.5 kg (05/09 1310)   General:   Alert,  Well-developed, well-nourished, pleasant and cooperative in NAD Head:  Normocephalic and atraumatic. Lungs:  Clear throughout to auscultation.   No wheezes, crackles, or rhonchi. Heart:  Regular rate and rhythm; no murmurs, clicks, rubs,  or  gallops. Abdomen: soft, large protruding hernia, umbilical scar, generalized tenderness throughout without rebound or guarding, + BS x 4 quads.  Rectal:  Deferred  Msk:  Symmetrical without gross deformities. . Pulses:  Normal pulses noted. Extremities:  Without clubbing or edema. Neurologic:  Alert and  oriented x4;  grossly normal neurologically. Skin:  Intact without significant lesions or rashes.. Psych:  Alert and cooperative. Normal mood and affect.  Intake/Output from previous day: No intake/output data recorded. Intake/Output this shift: No intake/output data recorded.  Lab Results: Recent Labs    08/28/18 1017 08/29/18 1430  WBC 9.0 7.9  HGB 11.1* 11.6*  HCT 34.6* 36.7  PLT 232 214   BMET Recent Labs    08/28/18 1017 08/29/18 1430  NA 135 137  K 5.1 4.6  CL 105 107  CO2 21* 22  GLUCOSE 196* 155*  BUN 26* 21  CREATININE 1.75* 1.66*  CALCIUM 8.7* 8.9   LFT Recent Labs    08/29/18 1430  PROT 6.0*  ALBUMIN 3.6  AST 28  ALT 43  ALKPHOS 67  BILITOT 0.9   PT/INR No results for input(s): LABPROT, INR in the last 72 hours. Hepatitis Panel No results for input(s): HEPBSAG, HCVAB, HEPAIGM, HEPBIGM in the last 72 hours.    Studies/Results: Ct Abdomen Pelvis Wo Contrast  Result Date: 08/29/2018 CLINICAL DATA:  Generalized abdominal pain EXAM: CT ABDOMEN AND PELVIS WITHOUT CONTRAST TECHNIQUE: Multidetector CT imaging of  the abdomen and pelvis was performed following the standard protocol without IV contrast. COMPARISON:  PET-CT, 04/03/2018 FINDINGS: Lower chest: No acute abnormality.  Coronary artery calcifications. Hepatobiliary: No focal liver abnormality is seen. Status post biliary duct stenting with biliary ductal dilatation and pneumobilia. Gallstones in the gallbladder. Pancreas: Unremarkable. No pancreatic ductal dilatation or surrounding inflammatory changes. Spleen: Normal in size without focal abnormality. Adrenals/Urinary Tract: Adrenal glands are  unremarkable. Status post left nephrectomy. Bladder is unremarkable. Stomach/Bowel: Stomach is within normal limits. Appendix appears normal. No evidence of bowel wall thickening, distention, or inflammatory changes. Large burden of dense stool in the colon. Pancolonic diverticulosis. Very redundant sigmoid colon. Vascular/Lymphatic: Calcific atherosclerosis. No enlarged abdominal or pelvic lymph nodes. Reproductive: No mass or other abnormality. Other: No abdominal wall hernia or abnormality. No abdominopelvic ascites. Musculoskeletal: No acute or significant osseous findings. IMPRESSION: 1. No definite acute non-contrast CT findings to explain abdominal pain. 2. Status post biliary ductal stenting with biliary ductal dilatation and pneumobilia. Gallstones in the gallbladder. 3. Large burden of dense stool in the colon. Pancolonic diverticulosis. Very redundant sigmoid colon. 4.  Status post left nephrectomy. Electronically Signed   By: Eddie Candle M.D.   On: 08/29/2018 16:04   US Abdomen Limited Ruq  Result Date: 08/29/2018 CLINICAL DATA:  Abdominal pain EXAM: ULTRASOUND ABDOMEN LIMITED RIGHT UPPER QUADRANT COMPARISON:  CT from earlier the same. FINDINGS: Gallbladder: Gallbladder is well distended with multiple gallstones within. Wall thickness is at the upper limits of normal. Common bile duct: Stent is noted within the common bile duct. It is true diameter is difficult to assess due to the stent placement Liver: No focal lesion identified. Within normal limits in parenchymal echogenicity. Portal vein is patent on color Doppler imaging with normal direction of blood flow towards the liver. IMPRESSION: Common bile duct stents in place. No definitive biliary ductal dilatation is seen. The changes on the prior CT likely related to incomplete opacification of portal vein. Cholelithiasis with upper limits normal wall thickening Electronically Signed   By: Inez Catalina M.D.   On: 08/29/2018 17:26     IMPRESSION/PLAN:   1. Upper and lower abdominal pain. S/P ERCP, choledocholithiasis with multiple stent placement to the CBD. + Gallstones. LFTs normal.  -surgical consult for possible cholecystectomy  -pain management per hospitalist -repeat CBC, CMP and lipase in am  -clear liquids -IV fluids per hospitalist  -further recommendations per Dr. Bryan Lemma   2. Constipation -Miralax   3. Hx renal cancer, s/p left nephrectomy      Noralyn Pick  08/29/2018, 6:15 PM

## 2018-08-29 NOTE — ED Notes (Signed)
Patient transported to CT 

## 2018-08-29 NOTE — ED Notes (Signed)
ED TO INPATIENT HANDOFF REPORT  ED Nurse Name and Phone #: [phone]  S Name/Age/Gender Audrey [name] 78 y.o. female Room/Bed: 037C/037C  Code Status   Code Status: Not on file  Home/SNF/Other Home Patient oriented to: self, place, time and situation Is this baseline? Yes   Triage Complete: Triage complete  Chief Complaint Abd Pain (cancer pt)  Triage Note Pt in with generalized abd pain, worse since this am. Denies any n/v, does have hx of Renal CA and is currently on immunotherapy, last chemo trx yesterday. Multiple ERCP's done last mo, denies any fever, cough or cp.   Allergies Allergies  Allergen Reactions  . Adhesive [Tape] Other (See Comments)    Tears skin off Paper tape is ok per patient  . Nsaids Other (See Comments)    Liver damage  . Statins Other (See Comments)    Liver/kidney issues.    Level of Care/Admitting Diagnosis ED Disposition    ED Disposition Condition Comment   Admit  Hospital Area: [place] [[id number]]  Level of Care: Med-Surg [16]  Covid Evaluation: N/A  Diagnosis: Acute cholecystitis [575.0.ICD-9-CM]  Admitting Physician: Elwyn Reach [[id number]]  Attending Physician: Elwyn Reach [[id number]]  Estimated length of stay: past midnight tomorrow  Certification:: I certify this patient will need inpatient services for at least 2 midnights  PT Class (Do Not Modify): Inpatient [101]  PT Acc Code (Do Not Modify): Private [1]       B Medical/Surgery History Past Medical History:  Diagnosis Date  . Arthritis    lower back, hips, hands  . Cancer [facility])    renal cancer - left kidney removed, pill chemo x 1 yr  . Diabetes mellitus ([place])    type 2 - no meds, diet controlled  . Early cataracts, bilateral   . Elevated liver enzymes   . GERD (gastroesophageal reflux disease)    occasional - diet controlled  . History of hiatal hernia   . HTN (hypertension)   . Hyperlipidemia   . Hypothyroidism   . Myocardial infarction  ([place]) 1991   no deficits  . SVD (spontaneous vaginal delivery)    x 3  . Wears glasses    Past Surgical History:  Procedure Laterality Date  . BALLOON DILATION N/A 07/23/2018   Procedure: BALLOON DILATION;  Surgeon: Rush Landmark Telford Nab., MD;  Location: [place];  Service: Gastroenterology;  Laterality: N/A;  . BILIARY BRUSHING  08/10/2018   Procedure: BILIARY BRUSHING;  Surgeon: Rush Landmark Telford Nab., MD;  Location: [place];  Service: Gastroenterology;;  . BILIARY DILATION  08/10/2018   Procedure: BILIARY DILATION;  Surgeon: Irving Copas., MD;  Location: [place];  Service: Gastroenterology;;  . BILIARY STENT PLACEMENT  08/10/2018   Procedure: BILIARY STENT PLACEMENT;  Surgeon: Irving Copas., MD;  Location: [place];  Service: Gastroenterology;;  . BIOPSY  07/23/2018   Procedure: BIOPSY;  Surgeon: Irving Copas., MD;  Location: [facility] ENDOSCOPY;  Service: Gastroenterology;;  . COLONOSCOPY     normal   . ENDOSCOPIC RETROGRADE CHOLANGIOPANCREATOGRAPHY (ERCP) WITH PROPOFOL N/A 08/10/2018   Procedure: ENDOSCOPIC RETROGRADE CHOLANGIOPANCREATOGRAPHY (ERCP) WITH PROPOFOL;  Surgeon: Irving Copas., MD;  Location: [place];  Service: Gastroenterology;  Laterality: N/A;  . ERCP N/A 07/23/2018   Procedure: ENDOSCOPIC RETROGRADE CHOLANGIOPANCREATOGRAPHY (ERCP);  Surgeon: Irving Copas., MD;  Location: [place];  Service: Gastroenterology;  Laterality: N/A;  . IR IMAGING GUIDED PORT INSERTION  01/08/2018  . LAPAROSCOPIC NEPHRECTOMY Left 08/01/2016   Procedure:  LAPAROSCOPIC  RADICAL NEPHRECTOMY/ REPAIR OF UMBILICAL HERNIA;  Surgeon: Raynelle Bring, MD;  [facility];  Service: Urology;  Laterality: Left;  . REMOVAL OF STONES  07/23/2018   Procedure: REMOVAL OF GALL STONES;  Surgeon: Irving Copas., MD;  Location: [place];  Service: Gastroenterology;;  . REMOVAL OF STONES  08/10/2018   Procedure: REMOVAL OF STONES;  Surgeon:  Irving Copas., MD;  Location: [place];  Service: Gastroenterology;;  . Audrey [name]  07/23/2018   Procedure: SPHINCTEROTOMY;  Surgeon: Irving Copas., MD;  Location: [place];  Service: Gastroenterology;;  . UPPER GI ENDOSCOPY     x 1     A IV Location/Drains/Wounds Patient Lines/Drains/Airways Status   Active Line/Drains/Airways    Name:   Placement date:   Placement time:   Site:   Days:   Implanted Port 01/30/18   01/30/18    0821    -   211   Peripheral IV 08/29/18 Left Antecubital   08/29/18    1432    Antecubital   less than 1   GI Stent 10 Fr.   08/10/18    1310    -   19   GI Stent 10 Fr.   08/27/18    1458    -   2   GI Stent 10 Fr.   08/27/18    1505    -   2          Intake/Output Last 24 hours No intake or output data in the 24 hours ending 08/29/18 1921  Labs/Imaging Results for orders placed or performed during the hospital encounter of 08/29/18 (from the past 48 hour(s))  Urinalysis, Routine w reflex microscopic     Status: Abnormal   Collection Time: 08/29/18  2:15 PM  Result Value Ref Range   Color, Urine STRAW (A) YELLOW   APPearance CLEAR CLEAR   Specific Gravity, Urine 1.008 1.005 - 1.030   pH 5.0 5.0 - 8.0   Glucose, UA 50 (A) NEGATIVE mg/dL   Hgb urine dipstick SMALL (A) NEGATIVE   Bilirubin Urine NEGATIVE NEGATIVE   Ketones, ur NEGATIVE NEGATIVE mg/dL   Protein, ur NEGATIVE NEGATIVE mg/dL   Nitrite NEGATIVE NEGATIVE   Leukocytes,Ua NEGATIVE NEGATIVE   RBC / HPF 0-5 0 - 5 RBC/hpf   Bacteria, UA NONE SEEN NONE SEEN   Squamous Epithelial / LPF 0-5 0 - 5    Comment: Performed at [facility], [place], [place], [place] [address]  CBC with Differential     Status: Abnormal   Collection Time: 08/29/18  2:30 PM  Result Value Ref Range   WBC 7.9 4.0 - 10.5 K/uL   RBC 3.85 (L) 3.87 - 5.11 MIL/uL   Hemoglobin 11.6 (L) 12.0 - 15.0 g/dL   HCT 36.7 36.0 - 46.0 %   MCV 95.3 80.0 - 100.0 fL   MCH 30.1 26.0 - 34.0 pg    MCHC 31.6 30.0 - 36.0 g/dL   RDW 14.0 11.5 - 15.5 %   Platelets 214 150 - 400 K/uL   nRBC 0.0 0.0 - 0.2 %   Neutrophils Relative % 71 %   Neutro Abs 5.7 1.7 - 7.7 K/uL   Lymphocytes Relative 13 %   Lymphs Abs 1.0 0.7 - 4.0 K/uL   Monocytes Relative 13 %   Monocytes Absolute 1.0 0.1 - 1.0 K/uL   Eosinophils Relative 1 %   Eosinophils Absolute 0.1 0.0 - 0.5 K/uL  Basophils Relative 1 %   Basophils Absolute 0.1 0.0 - 0.1 K/uL   Immature Granulocytes 1 %   Abs Immature Granulocytes 0.05 0.00 - 0.07 K/uL    Comment: Performed at [facility], [place] [address] [place], [place] [address]  Comprehensive metabolic panel     Status: Abnormal   Collection Time: 08/29/18  2:30 PM  Result Value Ref Range   Sodium 137 135 - 145 mmol/L   Potassium 4.6 3.5 - 5.1 mmol/L   Chloride 107 98 - 111 mmol/L   CO2 22 22 - 32 mmol/L   Glucose, Bld 155 (H) 70 - 99 mg/dL   BUN 21 8 - 23 mg/dL   Creatinine, Ser 1.66 (H) 0.44 - 1.00 mg/dL   Calcium 8.9 8.9 - 10.3 mg/dL   Total Protein 6.0 (L) 6.5 - 8.1 g/dL   Albumin 3.6 3.5 - 5.0 g/dL   AST 28 15 - 41 U/L   ALT 43 0 - 44 U/L   Alkaline Phosphatase 67 38 - 126 U/L   Total Bilirubin 0.9 0.3 - 1.2 mg/dL   GFR calc non Af Amer 30 (L) >60 mL/min   GFR calc Af Amer 34 (L) >60 mL/min   Anion gap 8 5 - 15    Comment: Performed at [facility] [address], [place], [place] [address]  Lipase, blood     Status: None   Collection Time: 08/29/18  2:30 PM  Result Value Ref Range   Lipase 31 11 - 51 U/L    Comment: Performed at [facility], [place] [address], [place], [place] [address]   Ct Abdomen Pelvis Wo Contrast  Result Date: 08/29/2018 CLINICAL DATA:  Generalized abdominal pain EXAM: CT ABDOMEN AND PELVIS WITHOUT CONTRAST TECHNIQUE: Multidetector CT imaging of the abdomen and pelvis was performed following the standard protocol without IV contrast. COMPARISON:  PET-CT, 04/03/2018 FINDINGS: Lower chest: No acute abnormality.  Coronary  artery calcifications. Hepatobiliary: No focal liver abnormality is seen. Status post biliary duct stenting with biliary ductal dilatation and pneumobilia. Gallstones in the gallbladder. Pancreas: Unremarkable. No pancreatic ductal dilatation or surrounding inflammatory changes. Spleen: Normal in size without focal abnormality. Adrenals/Urinary Tract: Adrenal glands are unremarkable. Status post left nephrectomy. Bladder is unremarkable. Stomach/Bowel: Stomach is within normal limits. Appendix appears normal. No evidence of bowel wall thickening, distention, or inflammatory changes. Large burden of dense stool in the colon. Pancolonic diverticulosis. Very redundant sigmoid colon. Vascular/Lymphatic: Calcific atherosclerosis. No enlarged abdominal or pelvic lymph nodes. Reproductive: No mass or other abnormality. Other: No abdominal wall hernia or abnormality. No abdominopelvic ascites. Musculoskeletal: No acute or significant osseous findings. IMPRESSION: 1. No definite acute non-contrast CT findings to explain abdominal pain. 2. Status post biliary ductal stenting with biliary ductal dilatation and pneumobilia. Gallstones in the gallbladder. 3. Large burden of dense stool in the colon. Pancolonic diverticulosis. Very redundant sigmoid colon. 4.  Status post left nephrectomy. Electronically Signed   By: Eddie Candle M.D.   On: 08/29/2018 16:04   US Abdomen Limited Ruq  Result Date: 08/29/2018 CLINICAL DATA:  Abdominal pain EXAM: ULTRASOUND ABDOMEN LIMITED RIGHT UPPER QUADRANT COMPARISON:  CT from earlier the same. FINDINGS: Gallbladder: Gallbladder is well distended with multiple gallstones within. Wall thickness is at the upper limits of normal. Common bile duct: Stent is noted within the common bile duct. It is true diameter is difficult to assess due to the stent placement Liver: No focal lesion identified. Within normal limits  in parenchymal echogenicity. Portal vein is patent on color Doppler imaging with  normal direction of blood flow towards the liver. IMPRESSION: Common bile duct stents in place. No definitive biliary ductal dilatation is seen. The changes on the prior CT likely related to incomplete opacification of portal vein. Cholelithiasis with upper limits normal wall thickening Electronically Signed   By: Inez Catalina M.D.   On: 08/29/2018 17:26    Pending Labs Unresulted Labs (From admission, onward)    Start     Ordered   08/29/18 1744  SARS Coronavirus 2 (CEPHEID - Performed in [place] hospital lab), Hosp Order  (Asymptomatic Patients Labs)  Once,   R    Question:  Rule Out  Answer:  Yes   08/29/18 1743   Signed and Held  CBC  (heparin)  Once,   R    Comments:  Baseline for heparin therapy IF NOT ALREADY DRAWN.  Notify MD if PLT < 100 K.    Signed and Held   Signed and Held  Creatinine, serum  (heparin)  Once,   R    Comments:  Baseline for heparin therapy IF NOT ALREADY DRAWN.    Signed and Held   Signed and Held  Comprehensive metabolic panel  Tomorrow morning,   R     Signed and Held   Signed and Held  CBC  Tomorrow morning,   R     Signed and Held          Vitals/Pain Today's Vitals   08/29/18 1815 08/29/18 1900 08/29/18 1906 08/29/18 1914  BP:      Pulse: (!) 56 (!) 59 (!) 55   Resp:      Temp:      TempSrc:      SpO2: 99% 98% 100%   Weight:      PainSc:    5     Isolation Precautions No active isolations  Medications Medications  polyethylene glycol (MIRALAX / GLYCOLAX) packet 17 g (17 g Oral Given 08/29/18 1657)  morphine 4 MG/ML injection 4 mg (4 mg Intravenous Given 08/29/18 1438)  ondansetron (ZOFRAN) injection 4 mg (4 mg Intravenous Given 08/29/18 1438)  fentaNYL (SUBLIMAZE) injection 50 mcg (50 mcg Intravenous Given 08/29/18 1651)    Mobility walks Low fall risk   Focused Assessments    R Recommendations: See Admitting Provider Note  Report given to:   Additional Notes:

## 2018-08-29 NOTE — Consult Note (Signed)
Reason for Consult: Gallstones Referring Physician: Dr. Coralie Common Lorge is an 78 y.o. female.  HPI: Asked see patient at the request of Dr. Jeanell Sparrow due to gallstones.  Patient is a complex history which include choledocholithiasis followed by GI medicine status post 3 ERCPs and stent placement.  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  Her disease is stable.  She also has significant cardiovascular disease.  She saw Dr. Barry Dienes last year for cholecystectomy but due to other problems put this on hold.  She has had issues recently with choledocholithiasis and has had 3 ERCPs with stent placement.  Her last was 2 days ago.  She is had more epigastric and right upper quadrant pain over the last 2 days.  The pain is constant and different than the pain she had before.  Her liver function studies are normal and she has a large gallstone in her gallbladder by CT scan and ultrasound without any obvious signs of inflammation today.  Her pain is not getting better despite medical treatment and she is being mated to the medical service we were asked to consult pertaining to gallstone disease  Past Medical History:  Diagnosis Date   Arthritis    lower back, hips, hands   Cancer ([place])    renal cancer - left kidney removed, pill chemo x 1 yr   Diabetes mellitus ([place])    type 2 - no meds, diet controlled   Early cataracts, bilateral    Elevated liver enzymes    GERD (gastroesophageal reflux disease)    occasional - diet controlled   History of hiatal hernia    HTN (hypertension)    Hyperlipidemia    Hypothyroidism    Myocardial infarction ([place]) 1991   no deficits   SVD (spontaneous vaginal delivery)    x 3   Wears glasses     Past Surgical History:  Procedure Laterality Date   BALLOON DILATION N/A 07/23/2018   Procedure: BALLOON DILATION;  Surgeon: Irving Copas., MD;  Location: [place];  Service: Gastroenterology;  Laterality: N/A;   BILIARY  BRUSHING  08/10/2018   Procedure: BILIARY BRUSHING;  Surgeon: Rush Landmark Telford Nab., MD;  Location: [place];  Service: Gastroenterology;;   BILIARY DILATION  08/10/2018   Procedure: BILIARY DILATION;  Surgeon: Irving Copas., MD;  Location: [place];  Service: Gastroenterology;;   BILIARY STENT PLACEMENT  08/10/2018   Procedure: BILIARY STENT PLACEMENT;  Surgeon: Irving Copas., MD;  Location: [place];  Service: Gastroenterology;;   BIOPSY  07/23/2018   Procedure: BIOPSY;  Surgeon: Irving Copas., MD;  Location: [facility] ENDOSCOPY;  Service: Gastroenterology;;   COLONOSCOPY     normal    ENDOSCOPIC RETROGRADE CHOLANGIOPANCREATOGRAPHY (ERCP) WITH PROPOFOL N/A 08/10/2018   Procedure: ENDOSCOPIC RETROGRADE CHOLANGIOPANCREATOGRAPHY (ERCP) WITH PROPOFOL;  Surgeon: Irving Copas., MD;  Location: [place];  Service: Gastroenterology;  Laterality: N/A;   ERCP N/A 07/23/2018   Procedure: ENDOSCOPIC RETROGRADE CHOLANGIOPANCREATOGRAPHY (ERCP);  Surgeon: Irving Copas., MD;  Location: [place];  Service: Gastroenterology;  Laterality: N/A;   IR IMAGING GUIDED PORT INSERTION  01/08/2018   LAPAROSCOPIC NEPHRECTOMY Left 08/01/2016   Procedure: LAPAROSCOPIC  RADICAL NEPHRECTOMY/ REPAIR OF UMBILICAL HERNIA;  Surgeon: Raynelle Bring, MD;  [facility];  Service: Urology;  Laterality: Left;   REMOVAL OF STONES  07/23/2018   Procedure: REMOVAL OF GALL STONES;  Surgeon: Rush Landmark Telford Nab., MD;  Location: [place];  Service: Gastroenterology;;   REMOVAL OF  STONES  08/10/2018   Procedure: REMOVAL OF STONES;  Surgeon: Mansouraty, Telford Nab., MD;  Location: [place];  Service: Gastroenterology;;   [name]  07/23/2018   Procedure: [name];  Surgeon: Irving Copas., MD;  Location[facility]COPY;  Service: Gastroenterology;;   UPPER GI ENDOSCOPY     x 1    Family History  Problem Relation Age of Onset   Heart attack  Father 75   Heart disease Brother 51       CABG   Colon cancer Neg Hx    Esophageal cancer Neg Hx    Inflammatory bowel disease Neg Hx    Liver disease Neg Hx    Pancreatic cancer Neg Hx    Rectal cancer Neg Hx    Stomach cancer Neg Hx     Social History:  reports that she quit smoking about 54 years ago. Her smoking use included cigarettes. She has a 8.00 pack-year smoking history. She has never used smokeless tobacco. She reports that she does not drink alcohol or use drugs.  Allergies:  Allergies  Allergen Reactions   Adhesive [Tape] Other (See Comments)    Tears skin off Paper tape is ok per patient   Nsaids Other (See Comments)    Liver damage   Statins Other (See Comments)    Liver/kidney issues.    Medications: I have reviewed the patient's current medications.  Results for orders placed or performed during the hospital encounter of 08/29/18 (from the past 48 hour(s))  Urinalysis, Routine w reflex microscopic     Status: Abnormal   Collection Time: 08/29/18  2:15 PM  Result Value Ref Range   Color, Urine STRAW (A) YELLOW   APPearance CLEAR CLEAR   Specific Gravity, Urine 1.008 1.005 - 1.030   pH 5.0 5.0 - 8.0   Glucose, UA 50 (A) NEGATIVE mg/dL   Hgb urine dipstick SMALL (A) NEGATIVE   Bilirubin Urine NEGATIVE NEGATIVE   Ketones, ur NEGATIVE NEGATIVE mg/dL   Protein, ur NEGATIVE NEGATIVE mg/dL   Nitrite NEGATIVE NEGATIVE   Leukocytes,Ua NEGATIVE NEGATIVE   RBC / HPF 0-5 0 - 5 RBC/hpf   Bacteria, UA NONE SEEN NONE SEEN   Squamous Epithelial / LPF 0-5 0 - 5    Comment: Performed at [facility], [place] [address], [place], [place] [address]  CBC with Differential     Status: Abnormal   Collection Time: 08/29/18  2:30 PM  Result Value Ref Range   WBC 7.9 4.0 - 10.5 K/uL   RBC 3.85 (L) 3.87 - 5.11 MIL/uL   Hemoglobin 11.6 (L) 12.0 - 15.0 g/dL   HCT 36.7 36.0 - 46.0 %   MCV 95.3 80.0 - 100.0 fL   MCH 30.1 26.0 - 34.0 pg   MCHC 31.6 30.0 - 36.0  g/dL   RDW 14.0 11.5 - 15.5 %   Platelets 214 150 - 400 K/uL   nRBC 0.0 0.0 - 0.2 %   Neutrophils Relative % 71 %   Neutro Abs 5.7 1.7 - 7.7 K/uL   Lymphocytes Relative 13 %   Lymphs Abs 1.0 0.7 - 4.0 K/uL   Monocytes Relative 13 %   Monocytes Absolute 1.0 0.1 - 1.0 K/uL   Eosinophils Relative 1 %   Eosinophils Absolute 0.1 0.0 - 0.5 K/uL   Basophils Relative 1 %   Basophils Absolute 0.1 0.0 - 0.1 K/uL   Immature Granulocytes 1 %   Abs Immature Granulocytes 0.05 0.00 - 0.07 K/uL  Comment: Performed at [facility], [place] [address] [place], [place] [address]  Comprehensive metabolic panel     Status: Abnormal   Collection Time: 08/29/18  2:30 PM  Result Value Ref Range   Sodium 137 135 - 145 mmol/L   Potassium 4.6 3.5 - 5.1 mmol/L   Chloride 107 98 - 111 mmol/L   CO2 22 22 - 32 mmol/L   Glucose, Bld 155 (H) 70 - 99 mg/dL   BUN 21 8 - 23 mg/dL   Creatinine, Ser 1.66 (H) 0.44 - 1.00 mg/dL   Calcium 8.9 8.9 - 10.3 mg/dL   Total Protein 6.0 (L) 6.5 - 8.1 g/dL   Albumin 3.6 3.5 - 5.0 g/dL   AST 28 15 - 41 U/L   ALT 43 0 - 44 U/L   Alkaline Phosphatase 67 38 - 126 U/L   Total Bilirubin 0.9 0.3 - 1.2 mg/dL   GFR calc non Af Amer 30 (L) >60 mL/min   GFR calc Af Amer 34 (L) >60 mL/min   Anion gap 8 5 - 15    Comment: Performed at [facility] [address], [place], [place] [address]  Lipase, blood     Status: None   Collection Time: 08/29/18  2:30 PM  Result Value Ref Range   Lipase 31 11 - 51 U/L    Comment: Performed at [facility], [place] [address] [place], [place] [address]    Ct Abdomen Pelvis Wo Contrast  Result Date: 08/29/2018 CLINICAL DATA:  Generalized abdominal pain EXAM: CT ABDOMEN AND PELVIS WITHOUT CONTRAST TECHNIQUE: Multidetector CT imaging of the abdomen and pelvis was performed following the standard protocol without IV contrast. COMPARISON:  PET-CT, 04/03/2018 FINDINGS: Lower chest: No acute abnormality.  Coronary artery calcifications.  Hepatobiliary: No focal liver abnormality is seen. Status post biliary duct stenting with biliary ductal dilatation and pneumobilia. Gallstones in the gallbladder. Pancreas: Unremarkable. No pancreatic ductal dilatation or surrounding inflammatory changes. Spleen: Normal in size without focal abnormality. Adrenals/Urinary Tract: Adrenal glands are unremarkable. Status post left nephrectomy. Bladder is unremarkable. Stomach/Bowel: Stomach is within normal limits. Appendix appears normal. No evidence of bowel wall thickening, distention, or inflammatory changes. Large burden of dense stool in the colon. Pancolonic diverticulosis. Very redundant sigmoid colon. Vascular/Lymphatic: Calcific atherosclerosis. No enlarged abdominal or pelvic lymph nodes. Reproductive: No mass or other abnormality. Other: No abdominal wall hernia or abnormality. No abdominopelvic ascites. Musculoskeletal: No acute or significant osseous findings. IMPRESSION: 1. No definite acute non-contrast CT findings to explain abdominal pain. 2. Status post biliary ductal stenting with biliary ductal dilatation and pneumobilia. Gallstones in the gallbladder. 3. Large burden of dense stool in the colon. Pancolonic diverticulosis. Very redundant sigmoid colon. 4.  Status post left nephrectomy. Electronically Signed   By: Eddie Candle M.D.   On: 08/29/2018 16:04   US Abdomen Limited Ruq  Result Date: 08/29/2018 CLINICAL DATA:  Abdominal pain EXAM: ULTRASOUND ABDOMEN LIMITED RIGHT UPPER QUADRANT COMPARISON:  CT from earlier the same. FINDINGS: Gallbladder: Gallbladder is well distended with multiple gallstones within. Wall thickness is at the upper limits of normal. Common bile duct: Stent is noted within the common bile duct. It is true diameter is difficult to assess due to the stent placement Liver: No focal lesion identified. Within normal limits in parenchymal echogenicity. Portal vein is patent on color Doppler imaging with normal direction of blood  flow towards the liver. IMPRESSION: Common bile duct stents in place. No definitive biliary  ductal dilatation is seen. The changes on the prior CT likely related to incomplete opacification of portal vein. Cholelithiasis with upper limits normal wall thickening Electronically Signed   By: Inez Catalina M.D.   On: 08/29/2018 17:26    Review of Systems  Gastrointestinal: Positive for abdominal pain.  All other systems reviewed and are negative.  Blood pressure (!) 148/77, pulse (!) 50, temperature 97.8 F (36.6 C), temperature source Oral, resp. rate 18, weight 60.5 kg, SpO2 99 %. Physical Exam  Constitutional: She is oriented to person, place, and time. She appears well-developed and well-nourished.  HENT:  Head: Normocephalic and atraumatic.  Eyes: Pupils are equal, round, and reactive to light.  Neck: Normal range of motion.  Respiratory: Effort normal.  GI: There is abdominal tenderness in the right upper quadrant. There is negative Murphy's sign.    Musculoskeletal: Normal range of motion.  Neurological: She is alert and oriented to person, place, and time.  Skin: Skin is warm and dry.  Psychiatric: She has a normal mood and affect. Her behavior is normal. Thought content normal.    Assessment/Plan: Symptomatic cholelithiasis with history of choledocholithiasis status post ERCP x3  Metastatic renal cell carcinoma-stable  Multiple medical comorbidity  She now has fairly constant biliary colic or low-grade acute cholecystitis on top of chronic cholecystitis.  Will evaluate for suitability for laparoscopic cholecystectomy.  We will schedule for tomorrow or Monday if medically cleared  May be cardiology evaluation prior to surgery  [place] 08/29/2018, 6:25 PM

## 2018-08-29 NOTE — ED Notes (Signed)
Pt ambulated to restroom with steady gait. Pt is complaining of pain and is requesting pain medication. EDP, Petrucelli, notified.

## 2018-08-29 NOTE — ED Provider Notes (Signed)
[facility] EMERGENCY DEPARTMENT Provider Note   CSN: [id number] Arrival date & time: 08/29/18  1304    History   Chief Complaint Chief Complaint  Patient presents with   Abdominal Pain   Generalized Body Aches    HPI Audrey [name] is a 78 y.o. 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 Patient states pain is generalized, but worse on the R side. It is crampy in nature, constant, currently a 5/10 in severity, worse w/ laying on her R side, and mildly alleviated w/ application of heating pad. Tried oxycodone without relief. She states this feels similar pain she has had intermittently over the past 1 month w/ issues which she states are related to her gallbladder. Denies fever, chills, nausea, vomiting, diarrhea, melena, hematochezia, dysuria, or hematuria.    Per chart review patient is followed by [facility]- Dr. Rush Landmark- it appears she has had ERCP x 3 (04/02, 04/20, and 05/07). ERCP 05/07: prior biliary sphincterotomy appeared open, prior stent had been removed, mild biliary narrowing in the lower 1/3rd of main bile duct improved from prior- dilated further w/ balloon- brushed for cytology, upper & middle third of the main bile duct moderate dilated, biliary tree swept & sludge found w/o pus/cholangitis, two plastic biliary stents were placed into the common bile duct to traverse the narrowing, but contrast moved appropriately before the stents were placed. Discharged on cipro 500 mg BID x 3 days. It was felt by GI  that her abnormal LFTs and her recurrent pain and now the inability to see the cystic duct or gallbladder are manifestations of progressive gallbladder disease w/ consideration of  cholecystectomy vs percutaneous cholecystostomy  tube placement vs cholecystoenterostomy.        HPI  Past Medical History:  Diagnosis Date   Arthritis    lower back, hips, hands   Cancer ([place])    renal cancer - left kidney removed, pill chemo x 1 yr   Diabetes mellitus (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   Preop cardiovascular exam 08/14/2018   Coronary artery disease involving native coronary artery of native heart without angina pectoris 08/14/2018   Dyslipidemia 08/14/2018   Elevated LFTs 08/05/2018   Choledocholithiasis 08/05/2018   Dilated bile duct 08/05/2018   History of ERCP 08/05/2018   History of renal cell carcinoma 08/05/2018   Port-A-Cath in place 01/30/2018   Counseling regarding advance care planning and goals of care 01/04/2018   Renal cell carcinoma ([place]) 04/11/2017   Cancer, 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[place]) 01/10/2017   Left renal mass 08/01/2016    Past Surgical History:  Procedure Laterality Date   BALLOON DILATION N/A 07/23/2018   Procedure: BALLOON DILATION;  Surgeon: Irving Copas., MD;  Location: [facility];  Service: Gastroenterology;  Laterality: N/A;   BILIARY BRUSHING  08/10/2018   Procedure: BILIARY BRUSHING;  Surgeon: Rush Landmark Telford Nab., MD;  Location: [place] [facility];  Service: Gastroenterology;;   BILIARY DILATION  08/10/2018   Procedure: BILIARY DILATION;  Surgeon: Irving Copas., MD;  Location: [facility];  Service: Gastroenterology;;   BILIARY STENT  PLACEMENT  08/10/2018   Procedure: BILIARY STENT PLACEMENT;  Surgeon: Irving Copas., MD;  Location: [place];  Service: Gastroenterology;;   BIOPSY  07/23/2018   Procedure: BIOPSY;  Surgeon: Irving Copas., MD;  Location: [facility] ENDOSCOPY;  Service: Gastroenterology;;   COLONOSCOPY     normal    ENDOSCOPIC RETROGRADE CHOLANGIOPANCREATOGRAPHY (ERCP) WITH PROPOFOL N/A 08/10/2018   Procedure: ENDOSCOPIC RETROGRADE CHOLANGIOPANCREATOGRAPHY (ERCP) WITH PROPOFOL;  Surgeon: Irving Copas., MD;  Location: [place];  Service: Gastroenterology;  Laterality: N/A;   ERCP N/A 07/23/2018   Procedure: ENDOSCOPIC RETROGRADE CHOLANGIOPANCREATOGRAPHY (ERCP);  Surgeon: Irving Copas., MD;  Location: [place];  Service: Gastroenterology;  Laterality: N/A;   IR IMAGING GUIDED PORT INSERTION  01/08/2018   LAPAROSCOPIC NEPHRECTOMY Left 08/01/2016   Procedure: LAPAROSCOPIC  RADICAL NEPHRECTOMY/ REPAIR OF UMBILICAL HERNIA;  Surgeon: Raynelle Bring, MD;  Location: [facility];  Service: Urology;  Laterality: Left;   REMOVAL OF STONES  07/23/2018   Procedure: REMOVAL OF GALL STONES;  Surgeon: Rush Landmark Telford Nab., MD;  Location: [facility];  Service: Gastroenterology;;   REMOVAL OF STONES  08/10/2018   Procedure: REMOVAL OF STONES;  Surgeon: Irving Copas., MD;  Location: [place];  Service: Gastroenterology;;   [name]  07/23/2018   Procedure: [name];  Surgeon: Irving Copas., MD;  Location: [facility] ENDOSCOPY;  Service: Gastroenterology;;   UPPER GI ENDOSCOPY     x 1     OB History   No obstetric history on file.      Home Medications    Prior to Admission medications   Medication Sig Start Date End Date Taking? Authorizing Provider  acetaminophen (TYLENOL) 500 MG tablet Take 1,000 mg by mouth 2 (two) times daily.     [provider]  amLODipine (NORVASC) 5 MG tablet Take 5 mg by mouth every evening.  09/01/17   [provider]  atorvastatin (LIPITOR) 40 MG tablet Take 40 mg by mouth daily at 6 PM.     [provider]  Calcium 600-200 MG-UNIT tablet Take 1 tablet by mouth daily at 12 noon.     [provider]  carvedilol (COREG) 6.25 MG tablet Take 6.25 mg by mouth 2 (two) times daily. 09/01/17   [provider]  ciprofloxacin (CIPRO) 500 MG tablet Take 1 tablet (500 mg total) by mouth 2 (two) times daily for 3 days. 08/27/18 08/30/18  Mansouraty, Telford Nab., MD  clobetasol ointment (TEMOVATE) 4.13 % Apply 1 application topically 2 (two) times daily as needed (for skin irritation).     [provider]  denosumab (XGEVA) 120 MG/1.7ML SOLN injection Inject 120 mg into the skin every 6 (six) weeks.    [provider]  hydrocortisone (CORTEF) 10 MG tablet Take 5-10 mg by mouth See admin instructions. Take 1 tablet (10 mg) by mouth in the morning & 0.5 tablet (5 mg) by mouth in the evening.    [provider]  levothyroxine (SYNTHROID, LEVOTHROID) 50 MCG tablet Take 50 mcg by mouth daily before breakfast.     [provider]  lidocaine-prilocaine (EMLA) cream Apply  to affected area once Patient taking differently: Apply 1 application topically daily as needed (prior to port access).  12/26/17   Brunetta Genera, MD  Nivolumab (OPDIVO IV) Inject 1 Dose into the vein every 14 (fourteen) days.    [provider]  omeprazole (PRILOSEC) 20 MG capsule Take 1 capsule (20 mg total) by mouth 2 (two) times daily. 07/23/18 07/23/19  Mansouraty, Telford Nab., MD  oxyCODONE (OXY IR/ROXICODONE) 5 MG immediate release tablet Take 1 tablet (5 mg total) by mouth every 6 (six) hours as needed for severe pain. 08/20/18   Mansouraty, Telford Nab., MD  senna-docusate (SENNA S) 8.6-50 MG tablet Take 2 tablets by mouth at bedtime. 07/31/18   Brunetta Genera, MD    Family History Family History  Problem Relation Age of Onset   Heart attack Father 8   Heart  disease Brother 29       CABG   Colon cancer Neg Hx    Esophageal cancer Neg Hx    Inflammatory bowel disease Neg Hx    Liver disease Neg Hx    Pancreatic cancer Neg Hx    Rectal cancer Neg Hx    Stomach cancer Neg Hx     Social History Social History   Tobacco Use   Smoking status: Former Smoker    Packs/day: 1.00    Years: 8.00    Pack years: 8.00    Types: Cigarettes    Last attempt to quit: 06/18/1964    Years since quitting: 54.2   Smokeless tobacco: Never Used  Substance Use Topics   Alcohol use: No   Drug use: No     Allergies   Adhesive [tape]; Nsaids; and Statins   Review of Systems Review of Systems  Constitutional: Negative for chills and fever.  Respiratory: Negative for cough and shortness of breath.   Cardiovascular: Negative for chest pain.  Gastrointestinal: Positive for abdominal pain. Negative for blood in stool, constipation, diarrhea, nausea and vomiting.  Genitourinary: Negative for dysuria and hematuria.  All other systems reviewed and are negative.    Physical Exam Updated Vital Signs BP (!) 147/62    Pulse (!) 51    Temp 97.8 F (36.6 C) (Oral)    Resp 10    Wt 60.5 kg    SpO2 100%    BMI 26.94 kg/m   Physical Exam Vitals signs and nursing note reviewed.  Constitutional:      General: She is not in acute distress.    Appearance: She is well-developed. She is not toxic-appearing.  HENT:     Head: Normocephalic and atraumatic.  Eyes:     General:        Right eye: No discharge.        Left eye: No discharge.     Conjunctiva/sclera: Conjunctivae normal.  Neck:     Musculoskeletal: Neck supple.  Cardiovascular:     Rate and Rhythm: Regular rhythm. Bradycardia present.  Pulmonary:     Effort: Pulmonary effort is normal. No respiratory distress.     Breath sounds: Normal breath sounds. No wheezing, rhonchi or rales.  Abdominal:     General: There is no distension.     Palpations: Abdomen is soft.     Tenderness: There  is generalized abdominal tenderness (most prominent to RUQ/RLQ). There is no guarding or rebound.     Hernia: A hernia (reducible, no overlying skin changes) is present. Hernia is present in the umbilical area.  Skin:  General: Skin is warm and dry.     Findings: No rash.  Neurological:     Mental Status: She is alert.     Comments: Clear speech.   Psychiatric:        Behavior: Behavior normal.    ED Treatments / Results  Labs (all labs ordered are listed, but only abnormal results are displayed) Labs Reviewed  CBC WITH DIFFERENTIAL/PLATELET - Abnormal; Notable for the following components:      Result Value   RBC 3.85 (*)    Hemoglobin 11.6 (*)    All other components within normal limits  COMPREHENSIVE METABOLIC PANEL - Abnormal; Notable for the following components:   Glucose, Bld 155 (*)    Creatinine, Ser 1.66 (*)    Total Protein 6.0 (*)    GFR calc non Af Amer 30 (*)    GFR calc Af Amer 34 (*)    All other components within normal limits  URINALYSIS, ROUTINE W REFLEX MICROSCOPIC - Abnormal; Notable for the following components:   Color, Urine STRAW (*)    Glucose, UA 50 (*)    Hgb urine dipstick SMALL (*)    All other components within normal limits  LIPASE, BLOOD    EKG None  Radiology Ct Abdomen Pelvis Wo Contrast  Result Date: 08/29/2018 CLINICAL DATA:  Generalized abdominal pain EXAM: CT ABDOMEN AND PELVIS WITHOUT CONTRAST TECHNIQUE: Multidetector CT imaging of the abdomen and pelvis was performed following the standard protocol without IV contrast. COMPARISON:  PET-CT, 04/03/2018 FINDINGS: Lower chest: No acute abnormality.  Coronary artery calcifications. Hepatobiliary: No focal liver abnormality is seen. Status post biliary duct stenting with biliary ductal dilatation and pneumobilia. Gallstones in the gallbladder. Pancreas: Unremarkable. No pancreatic ductal dilatation or surrounding inflammatory changes. Spleen: Normal in size without focal abnormality.  Adrenals/Urinary Tract: Adrenal glands are unremarkable. Status post left nephrectomy. Bladder is unremarkable. Stomach/Bowel: Stomach is within normal limits. Appendix appears normal. No evidence of bowel wall thickening, distention, or inflammatory changes. Large burden of dense stool in the colon. Pancolonic diverticulosis. Very redundant sigmoid colon. Vascular/Lymphatic: Calcific atherosclerosis. No enlarged abdominal or pelvic lymph nodes. Reproductive: No mass or other abnormality. Other: No abdominal wall hernia or abnormality. No abdominopelvic ascites. Musculoskeletal: No acute or significant osseous findings. IMPRESSION: 1. No definite acute non-contrast CT findings to explain abdominal pain. 2. Status post biliary ductal stenting with biliary ductal dilatation and pneumobilia. Gallstones in the gallbladder. 3. Large burden of dense stool in the colon. Pancolonic diverticulosis. Very redundant sigmoid colon. 4.  Status post left nephrectomy. Electronically Signed   By: Eddie Candle M.D.   On: 08/29/2018 16:04   US Abdomen Limited Ruq  Result Date: 08/29/2018 CLINICAL DATA:  Abdominal pain EXAM: ULTRASOUND ABDOMEN LIMITED RIGHT UPPER QUADRANT COMPARISON:  CT from earlier the same. FINDINGS: Gallbladder: Gallbladder is well distended with multiple gallstones within. Wall thickness is at the upper limits of normal. Common bile duct: Stent is noted within the common bile duct. It is true diameter is difficult to assess due to the stent placement Liver: No focal lesion identified. Within normal limits in parenchymal echogenicity. Portal vein is patent on color Doppler imaging with normal direction of blood flow towards the liver. IMPRESSION: Common bile duct stents in place. No definitive biliary ductal dilatation is seen. The changes on the prior CT likely related to incomplete opacification of portal vein. Cholelithiasis with upper limits normal wall thickening Electronically Signed   By: Inez Catalina  M.D.   On:  08/29/2018 17:26    Procedures Procedures (including critical care time)  Medications Ordered in ED Medications  polyethylene glycol (MIRALAX / GLYCOLAX) packet 17 g (17 g Oral Given 08/29/18 1657)  morphine 4 MG/ML injection 4 mg (4 mg Intravenous Given 08/29/18 1438)  ondansetron (ZOFRAN) injection 4 mg (4 mg Intravenous Given 08/29/18 1438)  fentaNYL (SUBLIMAZE) injection 50 mcg (50 mcg Intravenous Given 08/29/18 1651)     Initial Impression / Assessment and Plan / ED Course  I have reviewed the triage vital signs and the nursing notes.  Pertinent labs & imaging results that were available during my care of the patient were reviewed by me and considered in my medical decision making (see chart for details).  Patient w/ RCC on chemo, recent ERCP for ongoing abdominal pain issues presents w/ acute abdominal pain. Nontoxic, bradycardic (appears typical), elevated BP. Exam w/ generalized abdominal tenderness, worse to RUQ/RLQ. Plan for labs, CT, and GI consultation.    14:20: Discussed w/ nursing staff regarding IV access, initially was pending IV team to access patient's port, given delay will proceed w/ peripheral access.   --> shortly after discussion w/ nursing staff, were able to find alternative RN to access port.   Labs:  CBC: No leukocytosis. Anemia consistent w/ prior.  CMP: Creatinine elevation & mildly low protein consistent w/ prior. LFTs are WNL.  Lipase: WNL UA: Small hgb & glucosuria, no UTI, no ketones.   16:00: Patient w/ worsened pain will order fentanyl.   16:06: CONSULT: Discussed w/ Cottie Banda NP w/ [facility]- aware of patient, requesting call back w/ CT results.   CT abdomen/pelvis wo contrast as detailed above. No acute findings to suggest etiology of pain. Does appear to be constipated. Gallstones present.   16:16: CONSULT: Re-discussed w/ NP Merrilyn Puma- recommend trial of miralax for her constipation- will give first does in the ER, she will discuss  w/ her attending & call back, we have agreed upon RUQ [name] in the interim.    RUQ [name]:  Common bile duct stents in place. No definitive biliary ductal dilatation is seen. The changes on the prior CT likely related to incomplete opacification of portal vein. Cholelithiasis with upper limits normal wall thickening   17:30: Gastroenterologist Dr. Bryan Lemma present in the ER, has evaluated patient, likely multifactorial w/ her constipation & prolapse, but ultimately concern is chronic gallbladder issues, next step is likely surgical intervention. Plan to admit to hospitalist service w/ general surgery consultation & GI consultation.   17:56: CONSULT: Discussed w/ general surgeon Dr. Brantley Stage- agrees to consult.   08:20: CONSULT: Discussed w/ Dr Jonelle Sidle, triad hospitalists, accepts admission.   This is a shared visit with supervising physician Dr. Jeanell Sparrow who has independently evaluated patient & provided guidance in evaluation/management/disposition, in agreement with care   COVID testing added due to admissions status.  MANIE BEALER was evaluated in Emergency Department on 08/29/2018 for the symptoms described in the history of present illness. He/she was evaluated in the context of the global COVID-19 pandemic, which necessitated consideration that the patient might be at risk for infection with the SARS-CoV-2 virus that causes COVID-19. Institutional protocols and algorithms that pertain to the evaluation of patients at risk for COVID-19 are in a state of rapid change based on information released by regulatory bodies including the CDC and federal and state organizations. These policies and algorithms were followed during the patient's care in the ED.  Final Clinical Impressions(s) / ED Diagnoses   Final diagnoses:  Abdominal pain    ED Discharge Orders    None       Amaryllis Dyke, PA-C 08/29/18 1829    Pattricia Boss, MD 08/30/18 1118

## 2018-08-29 NOTE — Progress Notes (Signed)
PHARMACY NOTE:  ANTIMICROBIAL RENAL DOSAGE ADJUSTMENT  Current antimicrobial regimen includes a mismatch between antimicrobial dosage and estimated renal function.  As per policy approved by the Pharmacy & Therapeutics and Medical Executive Committees, the antimicrobial dosage will be adjusted accordingly.  Current antimicrobial dosage: Cipro 400mg  IV q12h  Indication: intra-abdominal infection   Renal Function:  Estimated Creatinine Clearance: 22.8 mL/min (A) (by C-G formula based on SCr of 1.66 mg/dL (H)). []      On intermittent HD, scheduled: []      On CRRT    Antimicrobial dosage has been changed to:  Cipro 400mg  IV q24h   Thank you for allowing pharmacy to be a part of this patient's care.  Luiz Ochoa, [facility] 08/29/2018 8:07 PM

## 2018-08-29 NOTE — ED Notes (Signed)
Pt ambulatory to bathroom

## 2018-08-30 ENCOUNTER — Inpatient Hospital Stay (HOSPITAL_COMMUNITY): Payer: Medicare Other

## 2018-08-30 ENCOUNTER — Other Ambulatory Visit: Payer: Self-pay

## 2018-08-30 DIAGNOSIS — K810 Acute cholecystitis: Secondary | ICD-10-CM

## 2018-08-30 LAB — COMPREHENSIVE METABOLIC PANEL
ALT: 33 U/L (ref 0–44)
AST: 21 U/L (ref 15–41)
Albumin: 3 g/dL — ABNORMAL LOW (ref 3.5–5.0)
Alkaline Phosphatase: 58 U/L (ref 38–126)
Anion gap: 8 (ref 5–15)
BUN: 17 mg/dL (ref 8–23)
CO2: 24 mmol/L (ref 22–32)
Calcium: 8.4 mg/dL — ABNORMAL LOW (ref 8.9–10.3)
Chloride: 106 mmol/L (ref 98–111)
Creatinine, Ser: 1.67 mg/dL — ABNORMAL HIGH (ref 0.44–1.00)
GFR calc Af Amer: 34 mL/min — ABNORMAL LOW (ref >60)
GFR calc non Af Amer: 29 mL/min — ABNORMAL LOW (ref >60)
Glucose, Bld: 197 mg/dL — ABNORMAL HIGH (ref 70–99)
Potassium: 4.7 mmol/L (ref 3.5–5.1)
Sodium: 138 mmol/L (ref 135–145)
Total Bilirubin: 0.6 mg/dL (ref 0.3–1.2)
Total Protein: 5.1 g/dL — ABNORMAL LOW (ref 6.5–8.1)

## 2018-08-30 LAB — CBC
HCT: 32.7 % — ABNORMAL LOW (ref 36.0–46.0)
Hemoglobin: 10.3 g/dL — ABNORMAL LOW (ref 12.0–15.0)
MCH: 29.8 pg (ref 26.0–34.0)
MCHC: 31.5 g/dL (ref 30.0–36.0)
MCV: 94.5 fL (ref 80.0–100.0)
Platelets: 193 10*3/uL (ref 150–400)
RBC: 3.46 MIL/uL — ABNORMAL LOW (ref 3.87–5.11)
RDW: 14 % (ref 11.5–15.5)
WBC: 6.2 10*3/uL (ref 4.0–10.5)
nRBC: 0 % (ref 0.0–0.2)

## 2018-08-30 LAB — SURGICAL PCR SCREEN
MRSA, PCR: NEGATIVE
Staphylococcus aureus: NEGATIVE

## 2018-08-30 MED ORDER — CLOBETASOL PROPIONATE 0.05 % EX OINT
TOPICAL_OINTMENT | Freq: Two times a day (BID) | CUTANEOUS | Status: DC | PRN
  Administered 2018-08-31: 04:00:00 via TOPICAL
  Filled 2018-08-30: qty 15

## 2018-08-30 MED ORDER — FENTANYL CITRATE (PF) 100 MCG/2ML IJ SOLN
50.0000 ug | Freq: Once | INTRAMUSCULAR | Status: AC
  Administered 2018-08-30: 50 ug via INTRAVENOUS
  Filled 2018-08-30: qty 2

## 2018-08-30 MED ORDER — SORBITOL 70 % SOLN
960.0000 mL | TOPICAL_OIL | Freq: Once | ORAL | Status: AC
  Administered 2018-08-30: 960 mL via RECTAL
  Filled 2018-08-30: qty 473

## 2018-08-30 MED ORDER — POLYETHYLENE GLYCOL 3350 17 G PO PACK
17.0000 g | PACK | Freq: Two times a day (BID) | ORAL | Status: DC
  Administered 2018-08-30 – 2018-09-03 (×7): 17 g via ORAL
  Filled 2018-08-30 (×8): qty 1

## 2018-08-30 NOTE — Progress Notes (Signed)
Subjective: Pain has almost completely resolved.  No nausea or vomiting. Very pleasant She was disappointed when I told her we might not do her surgery until tomorrow  Afebrile.  Stable. Liver function test normal.  WBC 6200.  Hemoglobin 10.3.  Objective: Vital signs in last 24 hours: Temp:  [97.8 F (36.6 C)-98.4 F (36.9 C)] 98.4 F (36.9 C) (05/10 0530) Pulse Rate:  [50-63] 63 (05/10 0530) Resp:  [10-18] 18 (05/09 1423) BP: (140-172)/(62-77) 140/76 (05/10 0530) SpO2:  [98 %-100 %] 99 % (05/10 0530) Weight:  [60.5 kg] 60.5 kg (05/09 1310) Last BM Date: 08/27/18  Intake/Output from previous day: 05/09 0701 - 05/10 0700 In: 937 [I.V.:537; IV Piggyback:400.1] Out: -  Intake/Output this shift: Total I/O In: -  Out: 1 [Urine:1]   Constitutional: She is oriented to person, place, and time.in no distress.  .  Eyes: Pupils are equal, round, and reactive to light.  Sclera clear  Respiratory: Effort normal.  GI: There is  minimal abdominal tenderness in the right upper quadrant.  No guarding.  No distention.  No mass there is negative Murphy's sign.    Musculoskeletal: Normal range of motion.  Neurological: She is alert and oriented to person, place, and time.  Skin: Skin is warm and dry.  Psychiatric: She has a normal mood and affect. Her behavior is normal. Thought content normal.   Lab Results:  Recent Labs    08/29/18 1430 08/30/18 0209  WBC 7.9 6.2  HGB 11.6* 10.3*  HCT 36.7 32.7*  PLT 214 193   BMET Recent Labs    08/29/18 1430 08/30/18 0209  NA 137 138  K 4.6 4.7  CL 107 106  CO2 22 24  GLUCOSE 155* 197*  BUN 21 17  CREATININE 1.66* 1.67*  CALCIUM 8.9 8.4*   PT/INR No results for input(s): LABPROT, INR in the last 72 hours. ABG No results for input(s): PHART, HCO3 in the last 72 hours.  Invalid input(s): PCO2, PO2  Studies/Results: Ct Abdomen Pelvis Wo Contrast  Result Date: 08/29/2018 CLINICAL DATA:  Generalized abdominal pain EXAM: CT  ABDOMEN AND PELVIS WITHOUT CONTRAST TECHNIQUE: Multidetector CT imaging of the abdomen and pelvis was performed following the standard protocol without IV contrast. COMPARISON:  PET-CT, 04/03/2018 FINDINGS: Lower chest: No acute abnormality.  Coronary artery calcifications. Hepatobiliary: No focal liver abnormality is seen. Status post biliary duct stenting with biliary ductal dilatation and pneumobilia. Gallstones in the gallbladder. Pancreas: Unremarkable. No pancreatic ductal dilatation or surrounding inflammatory changes. Spleen: Normal in size without focal abnormality. Adrenals/Urinary Tract: Adrenal glands are unremarkable. Status post left nephrectomy. Bladder is unremarkable. Stomach/Bowel: Stomach is within normal limits. Appendix appears normal. No evidence of bowel wall thickening, distention, or inflammatory changes. Large burden of dense stool in the colon. Pancolonic diverticulosis. Very redundant sigmoid colon. Vascular/Lymphatic: Calcific atherosclerosis. No enlarged abdominal or pelvic lymph nodes. Reproductive: No mass or other abnormality. Other: No abdominal wall hernia or abnormality. No abdominopelvic ascites. Musculoskeletal: No acute or significant osseous findings. IMPRESSION: 1. No definite acute non-contrast CT findings to explain abdominal pain. 2. Status post biliary ductal stenting with biliary ductal dilatation and pneumobilia. Gallstones in the gallbladder. 3. Large burden of dense stool in the colon. Pancolonic diverticulosis. Very redundant sigmoid colon. 4.  Status post left nephrectomy. Electronically Signed   By: Audrey [name] M.D.   On: 08/29/2018 16:04   US Abdomen Limited Ruq  Result Date: 08/29/2018 CLINICAL DATA:  Abdominal pain EXAM: ULTRASOUND ABDOMEN LIMITED RIGHT UPPER QUADRANT  COMPARISON:  CT from earlier the same. FINDINGS: Gallbladder: Gallbladder is well distended with multiple gallstones within. Wall thickness is at the upper limits of normal. Common bile duct:  Stent is noted within the common bile duct. It is true diameter is difficult to assess due to the stent placement Liver: No focal lesion identified. Within normal limits in parenchymal echogenicity. Portal vein is patent on color Doppler imaging with normal direction of blood flow towards the liver. IMPRESSION: Common bile duct stents in place. No definitive biliary ductal dilatation is seen. The changes on the prior CT likely related to incomplete opacification of portal vein. Cholelithiasis with upper limits normal wall thickening Electronically Signed   By: Audrey [name] M.D.   On: 08/29/2018 17:26    Anti-infectives: Anti-infectives (From admission, onward)   Start     Dose/Rate Route Frequency Ordered Stop   08/29/18 2200  ciprofloxacin (CIPRO) IVPB 400 mg     400 mg 200 mL/hr over 60 Minutes Intravenous Every 24 hours 08/29/18 1955     08/29/18 2000  metroNIDAZOLE (FLAGYL) IVPB 500 mg     500 mg 100 mL/hr over 60 Minutes Intravenous Every 8 hours 08/29/18 1955        Assessment/Plan:   Gallstones History choledocholithiasis status post ERCP x3 Metastatic renal cell carcinoma stable.  She has just completed chemotherapy Multiple medical comorbidity  Since she is completely stable and now essentially asymptomatic, most likely surgery will be tomorrow with Dr. Nadeen [name]. I will speak to Dr. Donne [name] who is our operating surgeon today to see if he is interested in moving the case up, but suspect that tomorrow would be a better idea when full staff and equipment are available  she states she is ready to go whenever   LOS: 1 day    Audrey [name] 08/30/2018

## 2018-08-30 NOTE — Plan of Care (Signed)
  Problem: Clinical Measurements: Goal: Ability to maintain clinical measurements within normal limits will improve Outcome: Progressing   Problem: Activity: Goal: Risk for activity intolerance will decrease Outcome: Progressing   Problem: Nutrition: Goal: Adequate nutrition will be maintained Outcome: Progressing   Problem: Coping: Goal: Level of anxiety will decrease Outcome: Progressing   Problem: Elimination: Goal: Will not experience complications related to bowel motility Outcome: Progressing   Problem: Pain Managment: Goal: General experience of comfort will improve Outcome: Progressing   Problem: Safety: Goal: Ability to remain free from injury will improve Outcome: Progressing   

## 2018-08-30 NOTE — Telephone Encounter (Signed)
Thank you for the update. We will follow-up next week with patient if she does not remain hospitalized. Chong Sicilian, can you reach out on Monday if the patient does not remain hospitalized and see how she is doing? Thank you. GM

## 2018-08-30 NOTE — Progress Notes (Signed)
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  Patient has had a left nephrectomy and is on chemotherapy need volume Mab, last infusion was the day prior to admission. Patient has had multiple ERCP for stent placement, she also had biliary sphincterotomy, had a stent which 1 of 2 was removed, she also had proximal dilation with cytology and multiple procedures.  She has been on oral antibiotics for possible cholecystitis. Patient was evaluated by GI and was recommending admission for evaluation of cholecystectomy. CT abdomen pelvis showed no definitive acute findings.  Status post biliary ductal stenting with biliary ductal dilatation and pneumobilia there were gallstones in the gallbladder.  She had large burden of dense stool in the colon with diverticulosis.  Right upper quadrant abdominal ultrasound showed cholelithiasis with changes of common bile duct dilatation.  Assessment & Plan:   Principal Problem:   Acute cholecystitis Active Problems:   Renal cell carcinoma (HCC)   Port-A-Cath in place   History of ERCP   Dyslipidemia   Chronic constipation   Abdominal pain   Biliary stricture  1- Cholelithiasis; chronic cholecystitis with history of choledocholithiasis status post multiple ERCP x3. malignancy noted on common bile duct brushing done earlier this week. Will consult oncology and I will inform general sx as well.  Will need to discussed case with patient oncologist prior to proceeding with surgery.  Surgery has been consulted.  Plan is for surgery during this admission. Patient denies chest pain or shortness of  breath on exertion.  Chest x-ray with cardiomegaly no pulmonary edema, EKG sinus tachycardia.  She has been cleared in the past by her cardiology for endoscopy and multiple procedures per patient. -Okay to proceed with surgery if needed. -Continue with IV antibiotics  2-Constipation: She is complaining of severe constipation and she has rectal prolapse. She received MiraLAX with no effect. I have order smog enema.  3-Hypertension; continue with amlodipine and Coreg.  4-Renal cell carcinoma: Following by oncology.  She has completed a immunotherapy/  (Nivolumab).  Will ask Dr. Seward Speck to rounding last  5-Vaginal prolapse; need to follow-up with her gynecologist.  Needs to follow-up with [facility] gynecology.  6-Anemia; monitor level.   Estimated body mass index is 26.94 kg/m as calculated from the following:   Height as of 08/28/18: 4\' 11"  (1.499 m).   Weight as of this encounter: 60.5 kg.   DVT prophylaxis: Heparin Code Status: Full code Family Communication: Care discussed with patient Disposition Plan: Remain in the hospital for treatment of abdominal pain, patient will require surgery this admission  Consultants:   Surgery  GI   Procedures:   None   Antimicrobials:  Ciprofloxacin and Flagyl 08/29/2018  Subjective: Complaining of bilateral lower quadrant abdominal pain, she also reports some right upper quadrant pain.Marland Kitchen Has rectal prolapse, is really difficult for her to have a bowel movement.  Objective: Vitals:   08/29/18 1900 08/29/18 1906 08/29/18 2001 08/30/18 0530  BP:   (!) 172/66 140/76  Pulse: (!) 59 (!) 55 (!) 51 63  Resp:      Temp:   98.2 F (36.8 C) 98.4 F (36.9 C)  TempSrc:   Oral Oral  SpO2: 98% 100% 100%  99%  Weight:        Intake/Output Summary (Last 24 hours) at 08/30/2018 0846 Last data filed at 08/30/2018 0739 Gross per 24 hour  Intake 937.01 ml  Output 1 ml  Net 936.01 ml   Filed Weights   08/29/18 1310  Weight: 60.5 kg    Examination:   General exam: NAD Respiratory system: Clear to auscultation. Respiratory effort normal. Cardiovascular system: S1 & S2 heard, RRR. No JVD, murmurs, rubs, gallops or clicks. No pedal edema. Gastrointestinal system: Abdomen is distended, mild tender, rectal exam sone prolapse  Central nervous system: Alert and oriented. No focal neurological deficits. Extremities: Symmetric 5 x 5 power. Skin: No rashes, lesions or ulcers Psychiatry: Judgement and insight appear normal. Mood & affect appropriate.     Data Reviewed: I have personally reviewed following labs and imaging studies  CBC: Recent Labs  Lab 08/28/18 1017 08/29/18 1430 08/30/18 0209  WBC 9.0 7.9 6.2  NEUTROABS 6.8 5.7  --   HGB 11.1* 11.6* 10.3*  HCT 34.6* 36.7 32.7*  MCV 93.5 95.3 94.5  PLT 232 214 562   Basic Metabolic Panel: Recent Labs  Lab 08/28/18 1017 08/29/18 1430 08/30/18 0209  NA 135 137 138  K 5.1 4.6 4.7  CL 105 107 106  CO2 21* 22 24  GLUCOSE 196* 155* 197*  BUN 26* 21 17  CREATININE 1.75* 1.66* 1.67*  CALCIUM 8.7* 8.9 8.4*   GFR: Estimated Creatinine Clearance: 22.7 mL/min (A) (by C-G formula based on SCr of 1.67 mg/dL (H)). Liver Function Tests: Recent Labs  Lab 08/28/18 1017 08/29/18 1430 08/30/18 0209  AST 18 28 21   ALT 52* 43 33  ALKPHOS 85 67 58  BILITOT 0.4 0.9 0.6  PROT 6.6 6.0* 5.1*  ALBUMIN 3.7 3.6 3.0*   Recent Labs  Lab 08/29/18 1430  LIPASE 31   No results for input(s): AMMONIA in the last 168 hours. Coagulation Profile: No results for input(s): INR, PROTIME in the last 168 hours. Cardiac Enzymes: No results for input(s): CKTOTAL, CKMB, CKMBINDEX, TROPONINI in the last 168 hours. BNP (last 3 results) No results for input(s): PROBNP in the last 8760 hours. HbA1C: No results for input(s): HGBA1C in the last 72 hours. CBG: Recent Labs  Lab 08/27/18 1526 08/27/18 1557 08/27/18 1647  GLUCAP 305* 333* 275*   Lipid Profile: No results for input(s): CHOL, HDL,  LDLCALC, TRIG, CHOLHDL, LDLDIRECT in the last 72 hours. Thyroid Function Tests: No results for input(s): TSH, T4TOTAL, FREET4, T3FREE, THYROIDAB in the last 72 hours. Anemia Panel: No results for input(s): VITAMINB12, FOLATE, FERRITIN, TIBC, IRON, RETICCTPCT in the last 72 hours. Sepsis Labs: No results for input(s): PROCALCITON, LATICACIDVEN in the last 168 hours.  Recent Results (from the past 240 hour(s))  SARS Coronavirus 2 (CEPHEID - Performed in [place] hospital lab), Hosp Order     Status: None   Collection Time: 08/29/18  7:15 PM  Result Value Ref Range Status   SARS Coronavirus 2 NEGATIVE NEGATIVE Final    Comment: (NOTE) If result is NEGATIVE SARS-CoV-2 target nucleic acids are NOT DETECTED. The SARS-CoV-2 RNA is generally detectable in upper and lower  respiratory specimens during the acute phase of infection. The lowest  concentration of SARS-CoV-2 viral copies this assay can detect is 250  copies / mL. A negative result does not preclude SARS-CoV-2 infection  and should not be used as the sole basis for treatment or other  patient management decisions.  A negative result may occur  with  improper specimen collection / handling, submission of specimen other  than nasopharyngeal swab, presence of viral mutation(s) within the  areas targeted by this assay, and inadequate number of viral copies  (<250 copies / mL). A negative result must be combined with clinical  observations, patient history, and epidemiological information. If result is POSITIVE SARS-CoV-2 target nucleic acids are DETECTED. The SARS-CoV-2 RNA is generally detectable in upper and lower  respiratory specimens dur ing the acute phase of infection.  Positive  results are indicative of active infection with SARS-CoV-2.  Clinical  correlation with patient history and other diagnostic information is  necessary to determine patient infection status.  Positive results do  not rule out bacterial infection or  co-infection with other viruses. If result is PRESUMPTIVE POSTIVE SARS-CoV-2 nucleic acids MAY BE PRESENT.   A presumptive positive result was obtained on the submitted specimen  and confirmed on repeat testing.  While 2019 novel coronavirus  (SARS-CoV-2) nucleic acids may be present in the submitted sample  additional confirmatory testing may be necessary for epidemiological  and / or clinical management purposes  to differentiate between  SARS-CoV-2 and other Sarbecovirus currently known to infect humans.  If clinically indicated additional testing with an alternate test  methodology [phone]) is advised. The SARS-CoV-2 RNA is generally  detectable in upper and lower respiratory sp ecimens during the acute  phase of infection. The expected result is Negative. Fact Sheet for Patients:  StrictlyIdeas.no Fact Sheet for Healthcare Providers: BankingDealers.co.za This test is not yet approved or cleared by the [place] FDA and has been authorized for detection and/or diagnosis of SARS-CoV-2 by FDA under an Emergency Use Authorization (EUA).  This EUA will remain in effect (meaning this test can be used) for the duration of the COVID-19 declaration under Section 564(b)(1) of the Act, 21 U.S.C. section 360bbb-3(b)(1), unless the authorization is terminated or revoked sooner. Performed at [facility], [place] [address], [place],  [address]          Radiology Studies: Ct Abdomen Pelvis Wo Contrast  Result Date: 08/29/2018 CLINICAL DATA:  Generalized abdominal pain EXAM: CT ABDOMEN AND PELVIS WITHOUT CONTRAST TECHNIQUE: Multidetector CT imaging of the abdomen and pelvis was performed following the standard protocol without IV contrast. COMPARISON:  PET-CT, 04/03/2018 FINDINGS: Lower chest: No acute abnormality.  Coronary artery calcifications. Hepatobiliary: No focal liver abnormality is seen. Status post biliary duct stenting  with biliary ductal dilatation and pneumobilia. Gallstones in the gallbladder. Pancreas: Unremarkable. No pancreatic ductal dilatation or surrounding inflammatory changes. Spleen: Normal in size without focal abnormality. Adrenals/Urinary Tract: Adrenal glands are unremarkable. Status post left nephrectomy. Bladder is unremarkable. Stomach/Bowel: Stomach is within normal limits. Appendix appears normal. No evidence of bowel wall thickening, distention, or inflammatory changes. Large burden of dense stool in the colon. Pancolonic diverticulosis. Very redundant sigmoid colon. Vascular/Lymphatic: Calcific atherosclerosis. No enlarged abdominal or pelvic lymph nodes. Reproductive: No mass or other abnormality. Other: No abdominal wall hernia or abnormality. No abdominopelvic ascites. Musculoskeletal: No acute or significant osseous findings. IMPRESSION: 1. No definite acute non-contrast CT findings to explain abdominal pain. 2. Status post biliary ductal stenting with biliary ductal dilatation and pneumobilia. Gallstones in the gallbladder. 3. Large burden of dense stool in the colon. Pancolonic diverticulosis. Very redundant sigmoid colon. 4.  Status post left nephrectomy. Electronically Signed   By: Eddie Candle M.D.   On: 08/29/2018 16:04   US Abdomen Limited Ruq  Result Date: 08/29/2018 CLINICAL DATA:  Abdominal  pain EXAM: ULTRASOUND ABDOMEN LIMITED RIGHT UPPER QUADRANT COMPARISON:  CT from earlier the same. FINDINGS: Gallbladder: Gallbladder is well distended with multiple gallstones within. Wall thickness is at the upper limits of normal. Common bile duct: Stent is noted within the common bile duct. It is true diameter is difficult to assess due to the stent placement Liver: No focal lesion identified. Within normal limits in parenchymal echogenicity. Portal vein is patent on color Doppler imaging with normal direction of blood flow towards the liver. IMPRESSION: Common bile duct stents in place. No definitive  biliary ductal dilatation is seen. The changes on the prior CT likely related to incomplete opacification of portal vein. Cholelithiasis with upper limits normal wall thickening Electronically Signed   By: Inez Catalina M.D.   On: 08/29/2018 17:26        Scheduled Meds: . amLODipine  5 mg Oral QPM  . atorvastatin  40 mg Oral q1800  . carvedilol  6.25 mg Oral BID WC  . heparin  5,000 Units Subcutaneous Q8H  . hydrocortisone  10 mg Oral Daily  . hydrocortisone  5 mg Oral QPM  . levothyroxine  50 mcg Oral QAC breakfast  . pantoprazole  40 mg Oral Daily  . polyethylene glycol  17 g Oral Daily   Continuous Infusions: . ciprofloxacin Stopped (08/29/18 2315)  . dextrose 5 % and 0.9% NaCl 75 mL/hr at 08/30/18 0642  . metronidazole Stopped (08/30/18 0616)     LOS: 1 day    Time spent: 35 minutes.     Elmarie Shiley, MD [facility] Pager [phone]  If 7PM-7AM, please contact night-coverage [email or web address] Password [facility] 08/30/2018, 8:46 AM

## 2018-08-30 NOTE — Progress Notes (Signed)
[facility] Progress Note  CC:  Abdominal pain s/p ERCP x 3 with stent placement   Subjective: She reports her abdominal pain is much less than yesterday. No nausea or vomiting. She passed multiple small formed stools after enema given.   Objective:  Vital signs in last 24 hours: Temp:  [97.8 F (36.6 C)-98.4 F (36.9 C)] 98.4 F (36.9 C) (05/10 0530) Pulse Rate:  [50-63] 63 (05/10 0530) Resp:  [10-18] 18 (05/09 1423) BP: (140-172)/(62-77) 140/76 (05/10 0530) SpO2:  [98 %-100 %] 99 % (05/10 0530) Weight:  [60.5 kg] 60.5 kg (05/09 1310) Last BM Date: 08/27/18 General:   Alert,  Well-developed, in NAD Heart:  Regular rate and rhythm; no murmurs Pulm: Lungs clear. Abdomen: soft, generalized tenderness throughout without rebound or guarding, large hernia.  Extremities:  Without edema. Neurologic:  Alert and  oriented x4;  grossly normal neurologically. Psych:  Alert and cooperative. Normal mood and affect.  Intake/Output from previous day: 05/09 0701 - 05/10 0700 In: 937 [I.V.:537; IV Piggyback:400.1] Out: -  Intake/Output this shift: Total I/O In: -  Out: 1 [Urine:1]  Lab Results: Recent Labs    08/28/18 1017 08/29/18 1430 08/30/18 0209  WBC 9.0 7.9 6.2  HGB 11.1* 11.6* 10.3*  HCT 34.6* 36.7 32.7*  PLT 232 214 193   BMET Recent Labs    08/28/18 1017 08/29/18 1430 08/30/18 0209  NA 135 137 138  K 5.1 4.6 4.7  CL 105 107 106  CO2 21* 22 24  GLUCOSE 196* 155* 197*  BUN 26* 21 17  CREATININE 1.75* 1.66* 1.67*  CALCIUM 8.7* 8.9 8.4*   LFT Recent Labs    08/30/18 0209  PROT 5.1*  ALBUMIN 3.0*  AST 21  ALT 33  ALKPHOS 58  BILITOT 0.6   PT/INR No results for input(s): LABPROT, INR in the last 72 hours. Hepatitis Panel No results for input(s): HEPBSAG, HCVAB, HEPAIGM, HEPBIGM in the last 72 hours.  Ct Abdomen Pelvis Wo Contrast  Result Date: 08/29/2018 CLINICAL DATA:  Generalized abdominal pain EXAM: CT ABDOMEN AND PELVIS WITHOUT  CONTRAST TECHNIQUE: Multidetector CT imaging of the abdomen and pelvis was performed following the standard protocol without IV contrast. COMPARISON:  PET-CT, 04/03/2018 FINDINGS: Lower chest: No acute abnormality.  Coronary artery calcifications. Hepatobiliary: No focal liver abnormality is seen. Status post biliary duct stenting with biliary ductal dilatation and pneumobilia. Gallstones in the gallbladder. Pancreas: Unremarkable. No pancreatic ductal dilatation or surrounding inflammatory changes. Spleen: Normal in size without focal abnormality. Adrenals/Urinary Tract: Adrenal glands are unremarkable. Status post left nephrectomy. Bladder is unremarkable. Stomach/Bowel: Stomach is within normal limits. Appendix appears normal. No evidence of bowel wall thickening, distention, or inflammatory changes. Large burden of dense stool in the colon. Pancolonic diverticulosis. Very redundant sigmoid colon. Vascular/Lymphatic: Calcific atherosclerosis. No enlarged abdominal or pelvic lymph nodes. Reproductive: No mass or other abnormality. Other: No abdominal wall hernia or abnormality. No abdominopelvic ascites. Musculoskeletal: No acute or significant osseous findings. IMPRESSION: 1. No definite acute non-contrast CT findings to explain abdominal pain. 2. Status post biliary ductal stenting with biliary ductal dilatation and pneumobilia. Gallstones in the gallbladder. 3. Large burden of dense stool in the colon. Pancolonic diverticulosis. Very redundant sigmoid colon. 4.  Status post left nephrectomy. Electronically Signed   By: Eddie Candle M.D.   On: 08/29/2018 16:04   Dg Chest 2 View  Result Date: 08/30/2018 CLINICAL DATA:  Preoperative chest radiograph prior to cholecystectomy. EXAM: CHEST - 2 VIEW COMPARISON:  05/05/2018 FINDINGS: UPPER limits normal heart size and RIGHT IJ Port-A-Cath with tip overlying the mid SVC again noted. There is no evidence of focal airspace disease, pulmonary edema, suspicious  pulmonary nodule/mass, pleural effusion, or pneumothorax. No acute bony abnormalities are identified. IMPRESSION: UPPER limits normal heart size without evidence of acute cardiopulmonary disease. Electronically Signed   By: Margarette Canada M.D.   On: 08/30/2018 11:41   US Abdomen Limited Ruq  Result Date: 08/29/2018 CLINICAL DATA:  Abdominal pain EXAM: ULTRASOUND ABDOMEN LIMITED RIGHT UPPER QUADRANT COMPARISON:  CT from earlier the same. FINDINGS: Gallbladder: Gallbladder is well distended with multiple gallstones within. Wall thickness is at the upper limits of normal. Common bile duct: Stent is noted within the common bile duct. It is true diameter is difficult to assess due to the stent placement Liver: No focal lesion identified. Within normal limits in parenchymal echogenicity. Portal vein is patent on color Doppler imaging with normal direction of blood flow towards the liver. IMPRESSION: Common bile duct stents in place. No definitive biliary ductal dilatation is seen. The changes on the prior CT likely related to incomplete opacification of portal vein. Cholelithiasis with upper limits normal wall thickening Electronically Signed   By: Inez Catalina M.D.   On: 08/29/2018 17:26    Assessment / Plan: 1. Upper and lower abdominal pain. S/P ERCP, choledocholithiasis with multiple stent placement to the CBD. + Gallstones. LFTs normal.  WBC 6.2. Abd/pelvic CT 5/9 did not show any acute findings to explain her abdominal pain, status post biliary ductal stenting with biliary ductal dilatation and pneumobilia. Gallstones in the gallbladder. RUQ [name] also performed and notable for GB stones and distended GB wall, stent in place in the CBD -plan for cholecystectomy 08/31/2018 -pain management per hospitalist  2. Chronic Constipation improving after enema given  3.  Hx renal cancer, s/p left nephrectomy         LOS: 1 day   Noralyn Pick  08/30/2018, 12:46 PM

## 2018-08-30 NOTE — Progress Notes (Signed)
Patient ID: Audrey [name], female   DOB: 01-28-1941, 78 y.o.   MRN: [id number] Discussed with Dr Tyrell Antonio. Has cells suspicious for malignancy on brushings from last ercp.  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 Will need to hold on cholecystectomy at this point for further evaluation, discuss with biliary team and oncology especially in setting of her stage IV renal cell

## 2018-08-31 ENCOUNTER — Encounter (HOSPITAL_COMMUNITY): Payer: Self-pay | Admitting: Gastroenterology

## 2018-08-31 DIAGNOSIS — R1013 Epigastric pain: Secondary | ICD-10-CM

## 2018-08-31 DIAGNOSIS — K829 Disease of gallbladder, unspecified: Secondary | ICD-10-CM

## 2018-08-31 LAB — BASIC METABOLIC PANEL
Anion gap: 9 (ref 5–15)
BUN: 12 mg/dL (ref 8–23)
CO2: 22 mmol/L (ref 22–32)
Calcium: 8.2 mg/dL — ABNORMAL LOW (ref 8.9–10.3)
Chloride: 107 mmol/L (ref 98–111)
Creatinine, Ser: 1.5 mg/dL — ABNORMAL HIGH (ref 0.44–1.00)
GFR calc Af Amer: 39 mL/min — ABNORMAL LOW (ref >60)
GFR calc non Af Amer: 33 mL/min — ABNORMAL LOW (ref >60)
Glucose, Bld: 178 mg/dL — ABNORMAL HIGH (ref 70–99)
Potassium: 4.2 mmol/L (ref 3.5–5.1)
Sodium: 138 mmol/L (ref 135–145)

## 2018-08-31 LAB — CBC
HCT: 33 % — ABNORMAL LOW (ref 36.0–46.0)
Hemoglobin: 10.4 g/dL — ABNORMAL LOW (ref 12.0–15.0)
MCH: 29.8 pg (ref 26.0–34.0)
MCHC: 31.5 g/dL (ref 30.0–36.0)
MCV: 94.6 fL (ref 80.0–100.0)
Platelets: 186 10*3/uL (ref 150–400)
RBC: 3.49 MIL/uL — ABNORMAL LOW (ref 3.87–5.11)
RDW: 13.8 % (ref 11.5–15.5)
WBC: 4.8 10*3/uL (ref 4.0–10.5)
nRBC: 0 % (ref 0.0–0.2)

## 2018-08-31 MED ORDER — SENNA 8.6 MG PO TABS
1.0000 | ORAL_TABLET | Freq: Every day | ORAL | Status: DC
  Administered 2018-08-31 – 2018-09-02 (×3): 8.6 mg via ORAL
  Filled 2018-08-31 (×4): qty 1

## 2018-08-31 MED ORDER — BISACODYL 5 MG PO TBEC
5.0000 mg | DELAYED_RELEASE_TABLET | Freq: Once | ORAL | Status: AC
  Administered 2018-08-31: 5 mg via ORAL
  Filled 2018-08-31: qty 1

## 2018-08-31 MED ORDER — SENNA 8.6 MG PO TABS: 1.0000 | ORAL_TABLET | Freq: Every day | ORAL | Status: DC

## 2018-08-31 MED ORDER — SODIUM CHLORIDE 0.9% FLUSH: 10.0000 mL | INTRAVENOUS | Status: DC | PRN

## 2018-08-31 NOTE — Progress Notes (Addendum)
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  Patient has had a left nephrectomy and is on chemotherapy need volume Mab, last infusion was the day prior to admission. Patient has had multiple ERCP for stent placement, she also had biliary sphincterotomy, had a stent which 1 of 2 was removed, she also had proximal dilation with cytology and multiple procedures.  She has been on oral antibiotics for possible cholecystitis. Patient was evaluated by GI and was recommending admission for evaluation of cholecystectomy. CT abdomen pelvis showed no definitive acute findings.  Status post biliary ductal stenting with biliary ductal dilatation and pneumobilia there were gallstones in the gallbladder.  She had large burden of dense stool in the colon with diverticulosis.  Right upper quadrant abdominal ultrasound showed cholelithiasis with changes of common bile duct dilatation.  Assessment & Plan:   Principal Problem:   Acute cholecystitis Active Problems:   Renal cell carcinoma (HCC)   Port-A-Cath in place   History of ERCP   Dyslipidemia   Chronic constipation   Abdominal pain   Biliary stricture  1- Cholelithiasis; chronic cholecystitis with history of choledocholithiasis status post multiple ERCP x3. Malignancy noted on common bile duct brushing done earlier this week. Oncologist Consulted. Left Message to Dr Irene Limbo.  -Surgery has been consulted.  They  are planning to defer surgery for outpatient procedure with coordination with hepatobiliary surgeon (Dr Barry Dienes).  -Continue with IV antibiotics -she had abnormal stress test in the  past. If she needs surgery she will need clearance by Cardiology for surgery.   2-Constipation: She is complaining of severe constipation and she has rectal prolapse. She received MiraLAX and dulcolax Received SMOG enema , with small bowel movement.   3-Hypertension; continue with amlodipine and Coreg.  4-Renal cell carcinoma: Following by oncology.  She has completed a immunotherapy/  (Nivolumab).   Dr Irene Limbo consulted.   5-Vaginal prolapse; need to follow-up with her gynecologist.  Needs to follow-up with [facility].  6-Anemia; monitor level.  Hb stable.   Estimated body mass index is 26.94 kg/m as calculated from the following:   Height as of 08/28/18: 4\' 11"  (1.499 m).   Weight as of this encounter: 60.5 kg.   DVT prophylaxis: Heparin Code Status: Full code Family Communication: Care discussed with patient Disposition Plan: Remain in the hospital for treatment of abdominal pain, pain management, and further discussion of care.   Consultants:   Surgery  GI   Procedures:   None   Antimicrobials:  Ciprofloxacin and Flagyl 08/29/2018  Subjective: She is complaining of a lot abdominal pain. Need something for pain. Had small BM yesterday.   Objective: Vitals:   08/30/18 1501 08/30/18 2105 08/31/18 0401 08/31/18 1436  BP: (!) 142/60 (!) 143/68 133/67 (!) 147/62  Pulse: 60 (!) 59 (!) 57 66  Resp:  18 18 16   Temp: 98.3 F (36.8 C) 98.1 F (36.7 C) 98.3 F (36.8 C) 98.4 F (36.9 C)  TempSrc: Oral Oral Oral Oral  SpO2: 100% 100% 98% 99%  Weight:        Intake/Output Summary (Last 24 hours) at 08/31/2018 1513 Last data filed at 08/31/2018 2637 Gross per 24 hour  Intake  1228.75 ml  Output --  Net 1228.75 ml   Filed Weights   08/29/18 1310  Weight: 60.5 kg    Examination:  General exam: NAD Respiratory system: CTA Cardiovascular system: S 1, S 2 RRR. Gastrointestinal system: BS present, soft, tender, no rigidity  Central nervous system: non focal.   Extremities: symmetric power.  Skin: no rashes.   Data Reviewed: I have personally reviewed following labs and imaging studies  CBC: Recent Labs  Lab 08/28/18 1017 08/29/18 1430 08/30/18 0209 08/31/18 0205  WBC 9.0 7.9 6.2 4.8  NEUTROABS 6.8 5.7  --   --   HGB 11.1* 11.6* 10.3* 10.4*  HCT 34.6* 36.7 32.7* 33.0*  MCV 93.5 95.3 94.5 94.6  PLT 232 214 193 086   Basic Metabolic Panel: Recent Labs  Lab 08/28/18 1017 08/29/18 1430 08/30/18 0209 08/31/18 0205  NA 135 137 138 138  K 5.1 4.6 4.7 4.2  CL 105 107 106 107  CO2 21* 22 24 22   GLUCOSE 196* 155* 197* 178*  BUN 26* 21 17 12   CREATININE 1.75* 1.66* 1.67* 1.50*  CALCIUM 8.7* 8.9 8.4* 8.2*   GFR: Estimated Creatinine Clearance: 25.2 mL/min (A) (by C-G formula based on SCr of 1.5 mg/dL (H)). Liver Function Tests: Recent Labs  Lab 08/28/18 1017 08/29/18 1430 08/30/18 0209  AST 18 28 21   ALT 52* 43 33  ALKPHOS 85 67 58  BILITOT 0.4 0.9 0.6  PROT 6.6 6.0* 5.1*  ALBUMIN 3.7 3.6 3.0*   Recent Labs  Lab 08/29/18 1430  LIPASE 31   No results for input(s): AMMONIA in the last 168 hours. Coagulation Profile: No results for input(s): INR, PROTIME in the last 168 hours. Cardiac Enzymes: No results for input(s): CKTOTAL, CKMB, CKMBINDEX, TROPONINI in the last 168 hours. BNP (last 3 results) No results for input(s): PROBNP in the last 8760 hours. HbA1C: No results for input(s): HGBA1C in the last 72 hours. CBG: Recent Labs  Lab 08/27/18 1526 08/27/18 1557 08/27/18 1647  GLUCAP 305* 333* 275*   Lipid Profile: No results for input(s): CHOL, HDL, LDLCALC, TRIG, CHOLHDL, LDLDIRECT in the last 72 hours. Thyroid Function Tests: No results for input(s): TSH, T4TOTAL, FREET4, T3FREE, THYROIDAB in the last 72 hours. Anemia Panel: No results for input(s): VITAMINB12, FOLATE, FERRITIN, TIBC, IRON, RETICCTPCT in the last 72 hours. Sepsis Labs: No results for input(s): PROCALCITON, LATICACIDVEN in the last 168  hours.  Recent Results (from the past 240 hour(s))  SARS Coronavirus 2 (CEPHEID - Performed in [place] hospital lab), Hosp Order     Status: None   Collection Time: 08/29/18  7:15 PM  Result Value Ref Range Status   SARS Coronavirus 2 NEGATIVE NEGATIVE Final    Comment: (NOTE) If result is NEGATIVE SARS-CoV-2 target nucleic acids are NOT DETECTED. The SARS-CoV-2 RNA is generally detectable in upper and lower  respiratory specimens during the acute phase of infection. The lowest  concentration of SARS-CoV-2 viral copies this assay can detect is 250  copies / mL. A negative result does not preclude SARS-CoV-2 infection  and should not be used as the sole basis for treatment or other  patient management decisions.  A negative result may occur with  improper specimen collection / handling, submission of specimen other  than nasopharyngeal swab, presence of viral mutation(s) within the  areas targeted by this assay, and inadequate number of viral copies  (<250 copies / mL). A negative result must be combined with clinical  observations, patient history,  and epidemiological information. If result is POSITIVE SARS-CoV-2 target nucleic acids are DETECTED. The SARS-CoV-2 RNA is generally detectable in upper and lower  respiratory specimens dur ing the acute phase of infection.  Positive  results are indicative of active infection with SARS-CoV-2.  Clinical  correlation with patient history and other diagnostic information is  necessary to determine patient infection status.  Positive results do  not rule out bacterial infection or co-infection with other viruses. If result is PRESUMPTIVE POSTIVE SARS-CoV-2 nucleic acids MAY BE PRESENT.   A presumptive positive result was obtained on the submitted specimen  and confirmed on repeat testing.  While 2019 novel coronavirus  (SARS-CoV-2) nucleic acids may be present in the submitted sample  additional confirmatory testing may be necessary for  epidemiological  and / or clinical management purposes  to differentiate between  SARS-CoV-2 and other Sarbecovirus currently known to infect humans.  If clinically indicated additional testing with an alternate test  methodology [phone]) is advised. The SARS-CoV-2 RNA is generally  detectable in upper and lower respiratory sp ecimens during the acute  phase of infection. The expected result is Negative. Fact Sheet for Patients:  StrictlyIdeas.no Fact Sheet for Healthcare Providers: BankingDealers.co.za This test is not yet approved or cleared by the [place] FDA and has been authorized for detection and/or diagnosis of SARS-CoV-2 by FDA under an Emergency Use Authorization (EUA).  This EUA will remain in effect (meaning this test can be used) for the duration of the COVID-19 declaration under Section 564(b)(1) of the Act, 21 U.S.C. section 360bbb-3(b)(1), unless the authorization is terminated or revoked sooner. Performed at [facility], [place] [address] [place], [place] [address]   Surgical pcr screen     Status: None   Collection Time: 08/30/18 11:31 AM  Result Value Ref Range Status   MRSA, PCR NEGATIVE NEGATIVE Final   Staphylococcus aureus NEGATIVE NEGATIVE Final    Comment: (NOTE) The Xpert SA Assay (FDA approved for NASAL specimens in patients 44 years of age and older), is one component of a comprehensive surveillance program. It is not intended to diagnose infection nor to guide or monitor treatment. Performed at [facility], [place] [address], [place], [place] [address]          Radiology Studies: Ct Abdomen Pelvis Wo Contrast  Result Date: 08/29/2018 CLINICAL DATA:  Generalized abdominal pain EXAM: CT ABDOMEN AND PELVIS WITHOUT CONTRAST TECHNIQUE: Multidetector CT imaging of the abdomen and pelvis was performed following the standard protocol without IV contrast. COMPARISON:  PET-CT, 04/03/2018  FINDINGS: Lower chest: No acute abnormality.  Coronary artery calcifications. Hepatobiliary: No focal liver abnormality is seen. Status post biliary duct stenting with biliary ductal dilatation and pneumobilia. Gallstones in the gallbladder. Pancreas: Unremarkable. No pancreatic ductal dilatation or surrounding inflammatory changes. Spleen: Normal in size without focal abnormality. Adrenals/Urinary Tract: Adrenal glands are unremarkable. Status post left nephrectomy. Bladder is unremarkable. Stomach/Bowel: Stomach is within normal limits. Appendix appears normal. No evidence of bowel wall thickening, distention, or inflammatory changes. Large burden of dense stool in the colon. Pancolonic diverticulosis. Very redundant sigmoid colon. Vascular/Lymphatic: Calcific atherosclerosis. No enlarged abdominal or pelvic lymph nodes. Reproductive: No mass or other abnormality. Other: No abdominal wall hernia or abnormality. No abdominopelvic ascites. Musculoskeletal: No acute or significant osseous findings. IMPRESSION: 1. No definite acute non-contrast CT findings to explain abdominal pain. 2. Status post biliary ductal stenting with biliary ductal dilatation and pneumobilia. Gallstones in the gallbladder. 3. Large burden of dense  stool in the colon. Pancolonic diverticulosis. Very redundant sigmoid colon. 4.  Status post left nephrectomy. Electronically Signed   By: Eddie Candle M.D.   On: 08/29/2018 16:04   Dg Chest 2 View  Result Date: 08/30/2018 CLINICAL DATA:  Preoperative chest radiograph prior to cholecystectomy. EXAM: CHEST - 2 VIEW COMPARISON:  05/05/2018 FINDINGS: UPPER limits normal heart size and RIGHT IJ Port-A-Cath with tip overlying the mid SVC again noted. There is no evidence of focal airspace disease, pulmonary edema, suspicious pulmonary nodule/mass, pleural effusion, or pneumothorax. No acute bony abnormalities are identified. IMPRESSION: UPPER limits normal heart size without evidence of acute  cardiopulmonary disease. Electronically Signed   By: Margarette Canada M.D.   On: 08/30/2018 11:41   US Abdomen Limited Ruq  Result Date: 08/29/2018 CLINICAL DATA:  Abdominal pain EXAM: ULTRASOUND ABDOMEN LIMITED RIGHT UPPER QUADRANT COMPARISON:  CT from earlier the same. FINDINGS: Gallbladder: Gallbladder is well distended with multiple gallstones within. Wall thickness is at the upper limits of normal. Common bile duct: Stent is noted within the common bile duct. It is true diameter is difficult to assess due to the stent placement Liver: No focal lesion identified. Within normal limits in parenchymal echogenicity. Portal vein is patent on color Doppler imaging with normal direction of blood flow towards the liver. IMPRESSION: Common bile duct stents in place. No definitive biliary ductal dilatation is seen. The changes on the prior CT likely related to incomplete opacification of portal vein. Cholelithiasis with upper limits normal wall thickening Electronically Signed   By: Inez Catalina M.D.   On: 08/29/2018 17:26        Scheduled Meds:  amLODipine  5 mg Oral QPM   atorvastatin  40 mg Oral q1800   carvedilol  6.25 mg Oral BID WC   heparin  5,000 Units Subcutaneous Q8H   hydrocortisone  10 mg Oral Daily   hydrocortisone  5 mg Oral QPM   levothyroxine  50 mcg Oral QAC breakfast   pantoprazole  40 mg Oral Daily   polyethylene glycol  17 g Oral BID   Continuous Infusions:  ciprofloxacin 400 mg (08/30/18 2134)   dextrose 5 % and 0.9% NaCl 75 mL/hr at 08/31/18 0621   metronidazole 500 mg (08/31/18 1108)     LOS: 2 days    Time spent: 35 minutes.     Elmarie Shiley, MD [facility] Pager [phone]  If 7PM-7AM, please contact night-coverage [email or web address] Password [facility] 08/31/2018, 3:13 PM

## 2018-08-31 NOTE — Telephone Encounter (Signed)
Had a chance to see this AM as she remains elevated. I would close this note for now. Thanks. GM

## 2018-08-31 NOTE — Progress Notes (Signed)
Called Dr. Jerilynn Mages to see if pt can have low fat diet per pt, she stated they had talked about it on rounds.

## 2018-08-31 NOTE — Progress Notes (Addendum)
[facility] Progress Note     Subjective: CC-  Patient states that she did have some persistent RUQ pain with PO intake yesterday. Mild nausea, no emesis. Abdomen still sore this morning. States that she's afraid to eat because the pain could return/worsen.  She also reports a history of constipation with vaginal and rectal prolapse. Typically only has 1 BM weekly. She received an enema yesterday and had multiple bowel movements. She is taking BID miralax.  Objective: Vital signs in last 24 hours: Temp:  [98.1 F (36.7 C)-98.3 F (36.8 C)] 98.3 F (36.8 C) (05/11 0401) Pulse Rate:  [57-60] 57 (05/11 0401) Resp:  [18] 18 (05/11 0401) BP: (133-143)/(60-68) 133/67 (05/11 0401) SpO2:  [98 %-100 %] 98 % (05/11 0401) Last BM Date: 08/30/18  Intake/Output from previous day: 05/10 0701 - 05/11 0700 In: 1723.5 [P.O.:490; I.V.:1233.5] Out: 1 [Urine:1] Intake/Output this shift: No intake/output data recorded.  PE: Gen:  Alert, NAD, pleasant HEENT: EOM's intact, pupils equal and round Card:  RRR Pulm:  CTAB, no W/R/R, effort normal Abd: Soft, ND, mild RUQ TTP without rebound or guarding, +BS, no HSM, soft/reducible umbilical hernia Ext: calves soft and nontender Psych: A&Ox3  Skin: no rashes noted, warm and dry  Lab Results:  Recent Labs    08/30/18 0209 08/31/18 0205  WBC 6.2 4.8  HGB 10.3* 10.4*  HCT 32.7* 33.0*  PLT 193 186   BMET Recent Labs    08/30/18 0209 08/31/18 0205  NA 138 138  K 4.7 4.2  CL 106 107  CO2 24 22  GLUCOSE 197* 178*  BUN 17 12  CREATININE 1.67* 1.50*  CALCIUM 8.4* 8.2*   PT/INR No results for input(s): LABPROT, INR in the last 72 hours. CMP     Component Value Date/Time   NA 138 08/31/2018 0205   NA 140 04/04/2017 0830   K 4.2 08/31/2018 0205   K 4.1 04/04/2017 0830   CL 107 08/31/2018 0205   CO2 22 08/31/2018 0205   CO2 21 (L) 04/04/2017 0830   GLUCOSE 178 (H) 08/31/2018 0205   GLUCOSE 107 04/04/2017 0830   BUN 12  08/31/2018 0205   BUN 37.0 (H) 04/04/2017 0830   CREATININE 1.50 (H) 08/31/2018 0205   CREATININE 1.75 (H) 08/28/2018 1017   CREATININE 2.2 (H) 04/04/2017 0830   CALCIUM 8.2 (L) 08/31/2018 0205   CALCIUM 10.1 04/04/2017 0830   PROT 5.1 (L) 08/30/2018 0209   PROT 7.5 04/04/2017 0830   ALBUMIN 3.0 (L) 08/30/2018 0209   ALBUMIN 4.0 04/04/2017 0830   AST 21 08/30/2018 0209   AST 18 08/28/2018 1017   AST 19 04/04/2017 0830   ALT 33 08/30/2018 0209   ALT 52 (H) 08/28/2018 1017   ALT 12 04/04/2017 0830   ALKPHOS 58 08/30/2018 0209   ALKPHOS 65 04/04/2017 0830   BILITOT 0.6 08/30/2018 0209   BILITOT 0.4 08/28/2018 1017   BILITOT 0.34 04/04/2017 0830   GFRNONAA 33 (L) 08/31/2018 0205   GFRNONAA 28 (L) 08/28/2018 1017   GFRAA 39 (L) 08/31/2018 0205   GFRAA 32 (L) 08/28/2018 1017   Lipase     Component Value Date/Time   LIPASE 31 08/29/2018 1430       Studies/Results: Ct Abdomen Pelvis Wo Contrast  Result Date: 08/29/2018 CLINICAL DATA:  Generalized abdominal pain EXAM: CT ABDOMEN AND PELVIS WITHOUT CONTRAST TECHNIQUE: Multidetector CT imaging of the abdomen and pelvis was performed following the standard protocol without IV contrast. COMPARISON:  PET-CT, 04/03/2018  FINDINGS: Lower chest: No acute abnormality.  Coronary artery calcifications. Hepatobiliary: No focal liver abnormality is seen. Status post biliary duct stenting with biliary ductal dilatation and pneumobilia. Gallstones in the gallbladder. Pancreas: Unremarkable. No pancreatic ductal dilatation or surrounding inflammatory changes. Spleen: Normal in size without focal abnormality. Adrenals/Urinary Tract: Adrenal glands are unremarkable. Status post left nephrectomy. Bladder is unremarkable. Stomach/Bowel: Stomach is within normal limits. Appendix appears normal. No evidence of bowel wall thickening, distention, or inflammatory changes. Large burden of dense stool in the colon. Pancolonic diverticulosis. Very redundant sigmoid  colon. Vascular/Lymphatic: Calcific atherosclerosis. No enlarged abdominal or pelvic lymph nodes. Reproductive: No mass or other abnormality. Other: No abdominal wall hernia or abnormality. No abdominopelvic ascites. Musculoskeletal: No acute or significant osseous findings. IMPRESSION: 1. No definite acute non-contrast CT findings to explain abdominal pain. 2. Status post biliary ductal stenting with biliary ductal dilatation and pneumobilia. Gallstones in the gallbladder. 3. Large burden of dense stool in the colon. Pancolonic diverticulosis. Very redundant sigmoid colon. 4.  Status post left nephrectomy. Electronically Signed   By: Eddie Candle M.D.   On: 08/29/2018 16:04   Dg Chest 2 View  Result Date: 08/30/2018 CLINICAL DATA:  Preoperative chest radiograph prior to cholecystectomy. EXAM: CHEST - 2 VIEW COMPARISON:  05/05/2018 FINDINGS: UPPER limits normal heart size and RIGHT IJ Port-A-Cath with tip overlying the mid SVC again noted. There is no evidence of focal airspace disease, pulmonary edema, suspicious pulmonary nodule/mass, pleural effusion, or pneumothorax. No acute bony abnormalities are identified. IMPRESSION: UPPER limits normal heart size without evidence of acute cardiopulmonary disease. Electronically Signed   By: Margarette Canada M.D.   On: 08/30/2018 11:41   US Abdomen Limited Ruq  Result Date: 08/29/2018 CLINICAL DATA:  Abdominal pain EXAM: ULTRASOUND ABDOMEN LIMITED RIGHT UPPER QUADRANT COMPARISON:  CT from earlier the same. FINDINGS: Gallbladder: Gallbladder is well distended with multiple gallstones within. Wall thickness is at the upper limits of normal. Common bile duct: Stent is noted within the common bile duct. It is true diameter is difficult to assess due to the stent placement Liver: No focal lesion identified. Within normal limits in parenchymal echogenicity. Portal vein is patent on color Doppler imaging with normal direction of blood flow towards the liver. IMPRESSION: Common  bile duct stents in place. No definitive biliary ductal dilatation is seen. The changes on the prior CT likely related to incomplete opacification of portal vein. Cholelithiasis with upper limits normal wall thickening Electronically Signed   By: Inez Catalina M.D.   On: 08/29/2018 17:26    Anti-infectives: Anti-infectives (From admission, onward)   Start     Dose/Rate Route Frequency Ordered Stop   08/29/18 2200  ciprofloxacin (CIPRO) IVPB 400 mg     400 mg 200 mL/hr over 60 Minutes Intravenous Every 24 hours 08/29/18 1955     08/29/18 2000  metroNIDAZOLE (FLAGYL) IVPB 500 mg     500 mg 100 mL/hr over 60 Minutes Intravenous Every 8 hours 08/29/18 1955         Assessment/Plan Metastatic renal cell carcinoma stable - just completed chemotherapy Constipation H/o vaginal and rectal prolapse HTN HLD Hypothyroidism GERD  Gallstones History choledocholithiasis status post ERCP x3 - most recent ERCP 5/7 with 3 bile duct brushings, 1 collected suspicious for malignancy - Will wait on cholecystectomy due to cytology findings. She also had a bile duct stricture noted on MRCP 08/06/18. Plan to discuss further with GI, Dr Barry Dienes, and oncology. [place] for full liquids today. Continue bowel  regimen. Patient states that she is very against and perc chole tube.  ID - cipro/flagyl 5/9>> FEN - IVF, FLD, miralax BID VTE - SCDs, sq heparin Foley - none Follow up - TBD Contact: son Elberta Fortis [phone]), daughter Daleen Snook [phone])   LOS: 2 days    Wellington [place] , [facility] 08/31/2018, 7:54 AM Pager: [phone] Mon-Thurs 7:00 am-4:30 pm Fri 7:00 am -11:30 AM Sat-Sun 7:00 am-11:30 am

## 2018-08-31 NOTE — Telephone Encounter (Signed)
The pt is currently hospitalized.

## 2018-08-31 NOTE — Progress Notes (Signed)
Daily Rounding Note  08/31/2018, 9:55 AM  LOS: 2 days   SUBJECTIVE:   Chief complaint: Abdominal pain.  Cholelithiasis.  Choledocholithiasis.    Pain, primarily in the epigastric/upper abdominal region continues though better controlled overall.  Had pain within an hour of eating full liquids yesterday. No vomiting or nausea. Had small amounts of formed stool after enema over the weekend.  OBJECTIVE:         Vital signs in last 24 hours:    Temp:  [98.1 F (36.7 C)-98.3 F (36.8 C)] 98.3 F (36.8 C) (05/11 0401) Pulse Rate:  [57-60] 57 (05/11 0401) Resp:  [18] 18 (05/11 0401) BP: (133-143)/(60-68) 133/67 (05/11 0401) SpO2:  [98 %-100 %] 98 % (05/11 0401) Last BM Date: 08/30/18 Filed Weights   08/29/18 1310  Weight: 60.5 kg   General: Pleasant, in good spirits.  Does not look terminal or acutely ill. Heart: RRR. Chest: No labored breathing or cough.  Lungs clear to auscultation in the front. Abdomen: Soft.  Active bowel sounds.  Ventral hernia.  Tenderness in the trouble upper abdomen Extremities: CCE. Neuro/Psych: In good spirits, and making some jokes..  Alert and oriented x3.  No tremors, no gross weakness or deficits.  Intake/Output from previous day: 05/10 0701 - 05/11 0700 In: 1723.5 [P.O.:490; I.V.:1233.5] Out: 1 [Urine:1]  Intake/Output this shift: No intake/output data recorded.  Lab Results: Recent Labs    08/29/18 1430 08/30/18 0209 08/31/18 0205  WBC 7.9 6.2 4.8  HGB 11.6* 10.3* 10.4*  HCT 36.7 32.7* 33.0*  PLT 214 193 186   BMET Recent Labs    08/29/18 1430 08/30/18 0209 08/31/18 0205  NA 137 138 138  K 4.6 4.7 4.2  CL 107 106 107  CO2 22 24 22   GLUCOSE 155* 197* 178*  BUN 21 17 12   CREATININE 1.66* 1.67* 1.50*  CALCIUM 8.9 8.4* 8.2*   LFT Recent Labs    08/28/18 1017 08/29/18 1430 08/30/18 0209  PROT 6.6 6.0* 5.1*  ALBUMIN 3.7 3.6 3.0*  AST 18 28 21   ALT 52* 43 33    ALKPHOS 85 67 58  BILITOT 0.4 0.9 0.6   PT/INR No results for input(s): LABPROT, INR in the last 72 hours. Hepatitis Panel No results for input(s): HEPBSAG, HCVAB, HEPAIGM, HEPBIGM in the last 72 hours.  Studies/Results: Ct Abdomen Pelvis Wo Contrast  Result Date: 08/29/2018 CLINICAL DATA:  Generalized abdominal pain EXAM: CT ABDOMEN AND PELVIS WITHOUT CONTRAST TECHNIQUE: Multidetector CT imaging of the abdomen and pelvis was performed following the standard protocol without IV contrast. COMPARISON:  PET-CT, 04/03/2018 FINDINGS: Lower chest: No acute abnormality.  Coronary artery calcifications. Hepatobiliary: No focal liver abnormality is seen. Status post biliary duct stenting with biliary ductal dilatation and pneumobilia. Gallstones in the gallbladder. Pancreas: Unremarkable. No pancreatic ductal dilatation or surrounding inflammatory changes. Spleen: Normal in size without focal abnormality. Adrenals/Urinary Tract: Adrenal glands are unremarkable. Status post left nephrectomy. Bladder is unremarkable. Stomach/Bowel: Stomach is within normal limits. Appendix appears normal. No evidence of bowel wall thickening, distention, or inflammatory changes. Large burden of dense stool in the colon. Pancolonic diverticulosis. Very redundant sigmoid colon. Vascular/Lymphatic: Calcific atherosclerosis. No enlarged abdominal or pelvic lymph nodes. Reproductive: No mass or other abnormality. Other: No abdominal wall hernia or abnormality. No abdominopelvic ascites. Musculoskeletal: No acute or significant osseous findings. IMPRESSION: 1. No definite acute non-contrast CT findings to explain abdominal pain. 2. Status post biliary ductal stenting with biliary  ductal dilatation and pneumobilia. Gallstones in the gallbladder. 3. Large burden of dense stool in the colon. Pancolonic diverticulosis. Very redundant sigmoid colon. 4.  Status post left nephrectomy. Electronically Signed   By: Eddie Candle M.D.   On:  08/29/2018 16:04   Dg Chest 2 View  Result Date: 08/30/2018 CLINICAL DATA:  Preoperative chest radiograph prior to cholecystectomy. EXAM: CHEST - 2 VIEW COMPARISON:  05/05/2018 FINDINGS: UPPER limits normal heart size and RIGHT IJ Port-A-Cath with tip overlying the mid SVC again noted. There is no evidence of focal airspace disease, pulmonary edema, suspicious pulmonary nodule/mass, pleural effusion, or pneumothorax. No acute bony abnormalities are identified. IMPRESSION: UPPER limits normal heart size without evidence of acute cardiopulmonary disease. Electronically Signed   By: Margarette Canada M.D.   On: 08/30/2018 11:41   US Abdomen Limited Ruq  Result Date: 08/29/2018 CLINICAL DATA:  Abdominal pain EXAM: ULTRASOUND ABDOMEN LIMITED RIGHT UPPER QUADRANT COMPARISON:  CT from earlier the same. FINDINGS: Gallbladder: Gallbladder is well distended with multiple gallstones within. Wall thickness is at the upper limits of normal. Common bile duct: Stent is noted within the common bile duct. It is true diameter is difficult to assess due to the stent placement Liver: No focal lesion identified. Within normal limits in parenchymal echogenicity. Portal vein is patent on color Doppler imaging with normal direction of blood flow towards the liver. IMPRESSION: Common bile duct stents in place. No definitive biliary ductal dilatation is seen. The changes on the prior CT likely related to incomplete opacification of portal vein. Cholelithiasis with upper limits normal wall thickening Electronically Signed   By: Inez Catalina M.D.   On: 08/29/2018 17:26    ASSESMENT:   *   Choledocholithiasis.  Gallstones.   ERCPs 4/2, 4/20, 5/7 (with EUS/FNA) with multiple stent placements, distal CBD narrowing/upstream dilation.  Duct brushings "atypical cells" .  FNA cytology "suspicious for cancer".   - Per surgery, Dr Donne Hazel, "wait on cholecystectomy due to cytology findings. 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 Plan to discuss further with GI, Dr Barry Dienes, and oncology. Ramona for full liquids today. Continue bowel regimen. Patient states that she is very against and perc chole tube".. "complete course of abx"  *   Renal cell cancer, metastatic.  Prior left nephrectomy.  No chemo for > 1 month due to elevated LFTs.  LFTs are now normal for the last 4 days..      *   Normocytic anemia.     *     AKI.   PLAN   *   Supportive care with abx. Day 4 Cipro, day 3 Metronidazole.  Several days outpt abx PTA.    *    ? Advance diet? Defer to Dr Jerilynn Mages.  Currently on full liquids.    *    No surgery per general surgery at this point.       Azucena Freed  08/31/2018, 9:55 AM Phone [phone]

## 2018-08-31 NOTE — Progress Notes (Signed)
Accessed PAC for pt and DC'd peripheral IV.

## 2018-09-01 ENCOUNTER — Encounter: Payer: Self-pay | Admitting: Gastroenterology

## 2018-09-01 DIAGNOSIS — C7951 Secondary malignant neoplasm of bone: Secondary | ICD-10-CM

## 2018-09-01 DIAGNOSIS — N179 Acute kidney failure, unspecified: Secondary | ICD-10-CM

## 2018-09-01 DIAGNOSIS — E896 Postprocedural adrenocortical (-medullary) hypofunction: Secondary | ICD-10-CM

## 2018-09-01 DIAGNOSIS — N189 Chronic kidney disease, unspecified: Secondary | ICD-10-CM

## 2018-09-01 DIAGNOSIS — C7972 Secondary malignant neoplasm of left adrenal gland: Secondary | ICD-10-CM

## 2018-09-01 DIAGNOSIS — C7801 Secondary malignant neoplasm of right lung: Secondary | ICD-10-CM

## 2018-09-01 DIAGNOSIS — K5900 Constipation, unspecified: Secondary | ICD-10-CM

## 2018-09-01 DIAGNOSIS — Z905 Acquired absence of kidney: Secondary | ICD-10-CM

## 2018-09-01 DIAGNOSIS — I700 Atherosclerosis of aorta: Secondary | ICD-10-CM

## 2018-09-01 DIAGNOSIS — C7971 Secondary malignant neoplasm of right adrenal gland: Secondary | ICD-10-CM

## 2018-09-01 DIAGNOSIS — K8021 Calculus of gallbladder without cholecystitis with obstruction: Secondary | ICD-10-CM

## 2018-09-01 DIAGNOSIS — K8040 Calculus of bile duct with cholecystitis, unspecified, without obstruction: Secondary | ICD-10-CM

## 2018-09-01 LAB — TYPE AND SCREEN
ABO/RH(D): O POS
Antibody Screen: NEGATIVE

## 2018-09-01 LAB — ABO/RH: ABO/RH(D): O POS

## 2018-09-01 LAB — PROTIME-INR
INR: 1.1 (ref 0.8–1.2)
Prothrombin Time: 13.7 seconds (ref 11.4–15.2)

## 2018-09-01 MED ORDER — GLYCERIN (LAXATIVE) 2.1 G RE SUPP
1.0000 | Freq: Every day | RECTAL | Status: DC
  Administered 2018-09-01: 1 via RECTAL
  Filled 2018-09-01 (×4): qty 1

## 2018-09-01 MED ORDER — MAGNESIUM CITRATE PO SOLN
1.0000 | Freq: Once | ORAL | Status: AC
  Administered 2018-09-01: 1 via ORAL
  Filled 2018-09-01: qty 296

## 2018-09-01 NOTE — Progress Notes (Signed)
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  Patient has had a left nephrectomy and is on chemotherapy need volume Mab, last infusion was the day prior to admission. Patient has had multiple ERCP for stent placement, she also had biliary sphincterotomy, had a stent which 1 of 2 was removed, she also had proximal dilation with cytology and multiple procedures.  She has been on oral antibiotics for possible cholecystitis. Patient was evaluated by GI and was recommending admission for evaluation of cholecystectomy. CT abdomen pelvis showed no definitive acute findings.  Status post biliary ductal stenting with biliary ductal dilatation and pneumobilia there were gallstones in the gallbladder.  She had large burden of dense stool in the colon with diverticulosis.  Right upper quadrant abdominal ultrasound showed cholelithiasis with changes of common bile duct dilatation.  Assessment & Plan:   Principal Problem:   Acute cholecystitis Active Problems:   Renal cell carcinoma (HCC)   Port-A-Cath in place   History of ERCP   Dyslipidemia   Chronic constipation   Abdominal pain   Biliary stricture  1- Cholelithiasis; chronic cholecystitis with history of choledocholithiasis status post multiple ERCP x3. Abnormal cells noted on common bile duct brushing done earlier this week. Oncologist Consulted, Dr Irene Limbo. He relates patient RCC is stable. He will leave a note in the chart. -spoke with Dr Barry Dienes, she will discussed with Dr Irene Limbo and Dr Rush Landmark regarding option for surgery.  -Surgery following.  -Continue with IV antibiotics -She  had abnormal stress test in the past. If she needs surgery she will need clearance by Cardiology for surgery.   2-Constipation: She is complaining of severe constipation and she has rectal prolapse. She received MiraLAX and dulcolax Received SMOG enema , with small bowel movement.  Will try magnesium citrate.   3-Hypertension; continue with amlodipine and Coreg.  4-Renal cell carcinoma: Following by oncology.  She has completed a immunotherapy/  (Nivolumab).   Dr Irene Limbo consulted.   5-Vaginal and rectal prolapse; need to follow-up with her gynecologist.  Needs to follow-up with [facility].  6-Anemia; monitor level.  Hb stable.   Estimated body mass index is 26.94 kg/m as calculated from the following:   Height as of 08/28/18: 4\' 11"  (1.499 m).   Weight as of this encounter: 60.5 kg.   DVT prophylaxis: Heparin Code Status: Full code Family Communication: Care discussed with patient Disposition Plan: Remain in the hospital for treatment of abdominal pain, pain management, and further discussion of care.   Consultants:   Surgery  GI   Procedures:   None   Antimicrobials:  Ciprofloxacin and Flagyl 08/29/2018  Subjective: No BM since she had enema 2 days ago.  Complaining of abdominal pain.  Her GYN refer her to [facility] for further procedure.   Objective: Vitals:   08/31/18 0401 08/31/18 1436 08/31/18 2014 09/01/18 0454  BP: 133/67 (!) 147/62 (!) 144/64 (!) 142/60  Pulse: (!) 57 66 60 (!) 57  Resp: 18 16 16 18   Temp: 98.3 F (36.8 C) 98.4 F (36.9 C) 98.7 F (37.1 C) 98.2 F (36.8 C)  TempSrc: Oral Oral Oral Oral  SpO2: 98% 99% 99% 99%  Weight:  Intake/Output Summary (Last 24 hours) at 09/01/2018 1225 Last data filed at 09/01/2018 2355 Gross per 24 hour  Intake 460 ml  Output -  Net 460 ml   Filed Weights   08/29/18 1310  Weight: 60.5 kg    Examination:  General exam: NAD Respiratory system: CTA Cardiovascular system: S 1, S 2 RRR  Gastrointestinal system: BS present, distended, mild tender.  Central nervous system: Non focal.  Extremities: Symmetric power.  Skin: no rashes.   Data Reviewed: I have personally reviewed following labs and imaging studies  CBC: Recent Labs  Lab 08/28/18 1017 08/29/18 1430 08/30/18 0209 08/31/18 0205  WBC 9.0 7.9 6.2 4.8  NEUTROABS 6.8 5.7  --   --   HGB 11.1* 11.6* 10.3* 10.4*  HCT 34.6* 36.7 32.7* 33.0*  MCV 93.5 95.3 94.5 94.6  PLT 232 214 193 732   Basic Metabolic Panel: Recent Labs  Lab 08/28/18 1017 08/29/18 1430 08/30/18 0209 08/31/18 0205  NA 135 137 138 138  K 5.1 4.6 4.7 4.2  CL 105 107 106 107  CO2 21* 22 24 22   GLUCOSE 196* 155* 197* 178*  BUN 26* 21 17 12   CREATININE 1.75* 1.66* 1.67* 1.50*  CALCIUM 8.7* 8.9 8.4* 8.2*   GFR: Estimated Creatinine Clearance: 25.2 mL/min (A) (by C-G formula based on SCr of 1.5 mg/dL (H)). Liver Function Tests: Recent Labs  Lab 08/28/18 1017 08/29/18 1430 08/30/18 0209  AST 18 28 21   ALT 52* 43 33  ALKPHOS 85 67 58  BILITOT 0.4 0.9 0.6  PROT 6.6 6.0* 5.1*  ALBUMIN 3.7 3.6 3.0*   Recent Labs  Lab 08/29/18 1430  LIPASE 31   No results for input(s): AMMONIA in the last 168 hours. Coagulation Profile: No results for input(s): INR, PROTIME in the last 168 hours. Cardiac Enzymes: No results for input(s): CKTOTAL, CKMB, CKMBINDEX, TROPONINI in the last 168 hours. BNP (last 3 results) No results for input(s): PROBNP in the last 8760 hours. HbA1C: No results for input(s): HGBA1C in the last 72 hours. CBG: Recent Labs  Lab 08/27/18 1526 08/27/18 1557 08/27/18 1647  GLUCAP 305* 333* 275*   Lipid Profile: No results for input(s): CHOL, HDL, LDLCALC, TRIG, CHOLHDL, LDLDIRECT in the last 72 hours. Thyroid Function Tests: No results for input(s): TSH, T4TOTAL, FREET4, T3FREE, THYROIDAB in the last 72 hours. Anemia Panel: No results for input(s): VITAMINB12, FOLATE, FERRITIN, TIBC, IRON, RETICCTPCT in the  last 72 hours. Sepsis Labs: No results for input(s): PROCALCITON, LATICACIDVEN in the last 168 hours.  Recent Results (from the past 240 hour(s))  SARS Coronavirus 2 (CEPHEID - Performed in [place] hospital lab), Hosp Order     Status: None   Collection Time: 08/29/18  7:15 PM  Result Value Ref Range Status   SARS Coronavirus 2 NEGATIVE NEGATIVE Final    Comment: (NOTE) If result is NEGATIVE SARS-CoV-2 target nucleic acids are NOT DETECTED. The SARS-CoV-2 RNA is generally detectable in upper and lower  respiratory specimens during the acute phase of infection. The lowest  concentration of SARS-CoV-2 viral copies this assay can detect is 250  copies / mL. A negative result does not preclude SARS-CoV-2 infection  and should not be used as the sole basis for treatment or other  patient management decisions.  A negative result may occur with  improper specimen collection / handling, submission of specimen other  than nasopharyngeal swab, presence of viral mutation(s) within the  areas targeted by this assay, and inadequate number of  viral copies  (<250 copies / mL). A negative result must be combined with clinical  observations, patient history, and epidemiological information. If result is POSITIVE SARS-CoV-2 target nucleic acids are DETECTED. The SARS-CoV-2 RNA is generally detectable in upper and lower  respiratory specimens dur ing the acute phase of infection.  Positive  results are indicative of active infection with SARS-CoV-2.  Clinical  correlation with patient history and other diagnostic information is  necessary to determine patient infection status.  Positive results do  not rule out bacterial infection or co-infection with other viruses. If result is PRESUMPTIVE POSTIVE SARS-CoV-2 nucleic acids MAY BE PRESENT.   A presumptive positive result was obtained on the submitted specimen  and confirmed on repeat testing.  While 2019 novel coronavirus  (SARS-CoV-2) nucleic  acids may be present in the submitted sample  additional confirmatory testing may be necessary for epidemiological  and / or clinical management purposes  to differentiate between  SARS-CoV-2 and other Sarbecovirus currently known to infect humans.  If clinically indicated additional testing with an alternate test  methodology [phone]) is advised. The SARS-CoV-2 RNA is generally  detectable in upper and lower respiratory sp ecimens during the acute  phase of infection. The expected result is Negative. Fact Sheet for Patients:  StrictlyIdeas.no Fact Sheet for Healthcare Providers: BankingDealers.co.za This test is not yet approved or cleared by the [place] FDA and has been authorized for detection and/or diagnosis of SARS-CoV-2 by FDA under an Emergency Use Authorization (EUA).  This EUA will remain in effect (meaning this test can be used) for the duration of the COVID-19 declaration under Section 564(b)(1) of the Act, 21 U.S.C. section 360bbb-3(b)(1), unless the authorization is terminated or revoked sooner. Performed at [facility], [place] [address], [place], [place] [address]   Surgical pcr screen     Status: None   Collection Time: 08/30/18 11:31 AM  Result Value Ref Range Status   MRSA, PCR NEGATIVE NEGATIVE Final   Staphylococcus aureus NEGATIVE NEGATIVE Final    Comment: (NOTE) The Xpert SA Assay (FDA approved for NASAL specimens in patients 32 years of age and older), is one component of a comprehensive surveillance program. It is not intended to diagnose infection nor to guide or monitor treatment. Performed at [facility], [place] [address] [place], [place] [address]          Radiology Studies: No results found.      Scheduled Meds: . amLODipine  5 mg Oral QPM  . atorvastatin  40 mg Oral q1800  . carvedilol  6.25 mg Oral BID WC  . Glycerin (Adult)  1 suppository Rectal Daily  . heparin   5,000 Units Subcutaneous Q8H  . hydrocortisone  10 mg Oral Daily  . hydrocortisone  5 mg Oral QPM  . levothyroxine  50 mcg Oral QAC breakfast  . magnesium citrate  1 Bottle Oral Once  . pantoprazole  40 mg Oral Daily  . polyethylene glycol  17 g Oral BID  . senna  1 tablet Oral QHS   Continuous Infusions: . ciprofloxacin 400 mg (08/31/18 2305)  . dextrose 5 % and 0.9% NaCl 75 mL/hr at 09/01/18 0250  . metronidazole 500 mg (09/01/18 0252)     LOS: 3 days    Time spent: 35 minutes.     Elmarie Shiley, MD [facility] Pager [phone]  If 7PM-7AM, please contact night-coverage [email or web address] Password [facility] 09/01/2018, 12:25 PM

## 2018-09-01 NOTE — H&P (View-Only) (Signed)
[facility] Progress Note     Subjective: CC-  Patient reports persistent abdominal pain. States that she tried eating spaghetti yesterday but it made her RUQ pain worse. Denies n/v. Feels bloated. Despite miralax BID and stool softeners she did not have a BM yesterday. Passing flatus.  Does not think she could go home in the state she is currently in. Adamant that she would not consider percutaneous cholecystostomy tube.  Objective: Vital signs in last 24 hours: Temp:  [98.2 F (36.8 C)-98.7 F (37.1 C)] 98.2 F (36.8 C) (05/12 0454) Pulse Rate:  [57-66] 57 (05/12 0454) Resp:  [16-18] 18 (05/12 0454) BP: (142-147)/(60-64) 142/60 (05/12 0454) SpO2:  [99 %] 99 % (05/12 0454) Last BM Date: 08/30/18  Intake/Output from previous day: 05/11 0701 - 05/12 0700 In: 460 [P.O.:460] Out: -  Intake/Output this shift: No intake/output data recorded.  PE: Gen:  Alert, NAD, tearful at times HEENT: EOM's intact, pupils equal and round Pulm:  CTAB, no W/R/R, effort normal Abd: Soft, mild distension, mild RUQ and lower abdominal TTP without rebound or guarding, +BS, no HSM, soft/reducible umbilical hernia Ext: calves soft and nontender Psych: A&Ox3  Skin: no rashes noted, warm and dry   Lab Results:  Recent Labs    08/30/18 0209 08/31/18 0205  WBC 6.2 4.8  HGB 10.3* 10.4*  HCT 32.7* 33.0*  PLT 193 186   BMET Recent Labs    08/30/18 0209 08/31/18 0205  NA 138 138  K 4.7 4.2  CL 106 107  CO2 24 22  GLUCOSE 197* 178*  BUN 17 12  CREATININE 1.67* 1.50*  CALCIUM 8.4* 8.2*   PT/INR No results for input(s): LABPROT, INR in the last 72 hours. CMP     Component Value Date/Time   NA 138 08/31/2018 0205   NA 140 04/04/2017 0830   K 4.2 08/31/2018 0205   K 4.1 04/04/2017 0830   CL 107 08/31/2018 0205   CO2 22 08/31/2018 0205   CO2 21 (L) 04/04/2017 0830   GLUCOSE 178 (H) 08/31/2018 0205   GLUCOSE 107 04/04/2017 0830   BUN 12 08/31/2018 0205   BUN 37.0 (H)  04/04/2017 0830   CREATININE 1.50 (H) 08/31/2018 0205   CREATININE 1.75 (H) 08/28/2018 1017   CREATININE 2.2 (H) 04/04/2017 0830   CALCIUM 8.2 (L) 08/31/2018 0205   CALCIUM 10.1 04/04/2017 0830   PROT 5.1 (L) 08/30/2018 0209   PROT 7.5 04/04/2017 0830   ALBUMIN 3.0 (L) 08/30/2018 0209   ALBUMIN 4.0 04/04/2017 0830   AST 21 08/30/2018 0209   AST 18 08/28/2018 1017   AST 19 04/04/2017 0830   ALT 33 08/30/2018 0209   ALT 52 (H) 08/28/2018 1017   ALT 12 04/04/2017 0830   ALKPHOS 58 08/30/2018 0209   ALKPHOS 65 04/04/2017 0830   BILITOT 0.6 08/30/2018 0209   BILITOT 0.4 08/28/2018 1017   BILITOT 0.34 04/04/2017 0830   GFRNONAA 33 (L) 08/31/2018 0205   GFRNONAA 28 (L) 08/28/2018 1017   GFRAA 39 (L) 08/31/2018 0205   GFRAA 32 (L) 08/28/2018 1017   Lipase     Component Value Date/Time   LIPASE 31 08/29/2018 1430       Studies/Results: Dg Chest 2 View  Result Date: 08/30/2018 CLINICAL DATA:  Preoperative chest radiograph prior to cholecystectomy. EXAM: CHEST - 2 VIEW COMPARISON:  05/05/2018 FINDINGS: UPPER limits normal heart size and RIGHT IJ Port-A-Cath with tip overlying the mid SVC again noted. There is no evidence of  focal airspace disease, pulmonary edema, suspicious pulmonary nodule/mass, pleural effusion, or pneumothorax. No acute bony abnormalities are identified. IMPRESSION: UPPER limits normal heart size without evidence of acute cardiopulmonary disease. Electronically Signed   By: Margarette Canada M.D.   On: 08/30/2018 11:41    Anti-infectives: Anti-infectives (From admission, onward)   Start     Dose/Rate Route Frequency Ordered Stop   08/29/18 2200  ciprofloxacin (CIPRO) IVPB 400 mg     400 mg 200 mL/hr over 60 Minutes Intravenous Every 24 hours 08/29/18 1955     08/29/18 2000  metroNIDAZOLE (FLAGYL) IVPB 500 mg     500 mg 100 mL/hr over 60 Minutes Intravenous Every 8 hours 08/29/18 1955         Assessment/Plan Metastatic renal cell carcinoma stable - just  completed chemotherapy Constipation H/o vaginal and rectal prolapse HTN HLD Hypothyroidism GERD  Gallstones History choledocholithiasis status post ERCP x3 - most recent ERCP 5/7 with 3 bile duct brushings, 1 collected suspicious for malignancy - Persistent abdominal pain could be multifactorial from gallbladder and constipation. Add daily suppository for constipation and continue miralax/dulcolax.  Again discussed possibility of perc chole tube if RUQ pain persists but patient states she will not consider this, states that it would ruin her quality of life and she just wants her gallbladder removed. Discussed why this is not a safe idea at this time given atypical cells being present in setting of bile duct stricture and the possibility of underlying malignancy in bile duct, and our recommendation would be to coordinate an outpatient procedure with hepatobiliary surgeon Dr. Barry Dienes in case she needs more extensive surgery than just a cholecystectomy. Continue HH diet today and work on bowel regimen.   ID - cipro/flagyl 5/9>> FEN - HH diet, miralax BID and dulcolax and suppository VTE - SCDs, sq heparin Foley - none Follow up - TBD Contact: son Elberta Fortis [phone]), daughter Daleen Snook [phone])    LOS: 3 days    Wellington Hampshire , [facility] 09/01/2018, 8:06 AM Pager: [phone] Mon-Thurs 7:00 am-4:30 pm Fri 7:00 am -11:30 AM Sat-Sun 7:00 am-11:30 am

## 2018-09-01 NOTE — Progress Notes (Signed)
HEMATOLOGY-ONCOLOGY PROGRESS NOTE  SUBJECTIVE: [name] is a 78 year old female currently being treated for metastatic renal cell carcinoma with immunotherapy.  Her last cycle was given on 08/28/2018.  She has been admitted to the hospital with abdominal pain with acute cholecystitis with cholelithiasis.  She has undergone multiple ERCPs in recent months.  GI and general surgery have seen the patient during this admission.  She will be going to the OR tomorrow for cholecystectomy.  The patient reports abdominal fullness but no pain at this time.  She is able to eat and drink.  Still having constipation and has taken magnesium citrate today.  She has no other complaints today.    Cancer, 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Audrey [name])   01/10/2017 Initial Diagnosis    Cancer, 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Audrey [name])    01/02/2018 -  Chemotherapy    The patient had nivolumab (OPDIVO) 240 mg in sodium chloride 0.9 % 100 mL chemo infusion, 240 mg, Intravenous, Once, 14 of 19 cycles Administration: 240 mg (01/02/2018), 240 mg (01/15/2018), 240 mg (01/30/2018), 240 mg (02/13/2018), 240 mg (02/27/2018), 240 mg (03/13/2018), 240 mg (03/27/2018), 240 mg (04/10/2018), 240 mg (04/24/2018), 240 mg (05/08/2018), 240 mg (05/22/2018), 240 mg (06/05/2018), 240 mg (06/19/2018), 240 mg (08/28/2018)  for chemotherapy treatment.      Renal cell carcinoma ([place])   04/11/2017 Initial Diagnosis    Renal cell carcinoma ([place])    01/02/2018 -  Chemotherapy    The patient had nivolumab (OPDIVO) 240 mg in sodium chloride 0.9 % 100 mL chemo infusion, 240 mg, Intravenous, Once, 14 of 19 cycles Administration: 240 mg (01/02/2018), 240 mg (01/15/2018), 240 mg (01/30/2018), 240 mg (02/13/2018), 240 mg (02/27/2018), 240 mg (03/13/2018), 240 mg (03/27/2018), 240 mg (04/10/2018), 240 mg (04/24/2018), 240 mg (05/08/2018), 240 mg (05/22/2018), 240 mg (06/05/2018), 240 mg (06/19/2018), 240 mg (08/28/2018)  for chemotherapy treatment.       REVIEW OF SYSTEMS:   Constitutional: Denies fevers,  chills Eyes: Denies blurriness of vision Ears, nose, mouth, throat, and face: Denies mucositis or sore throat Respiratory: Denies cough, dyspnea or wheezes Cardiovascular: Denies palpitation, chest discomfort Gastrointestinal: Reports abdominal fullness.  No abdominal pain this afternoon.  Denies nausea, vomiting.  Reports constipation. Skin: Denies abnormal skin rashes Lymphatics: Denies new lymphadenopathy or easy bruising Neurological:Denies numbness, tingling or new weaknesses Behavioral/Psych: Mood is stable, no new changes  Extremities: No lower extremity edema All other systems were reviewed with the patient and are negative.  I have reviewed the past medical history, past surgical history, social history and family history with the patient and they are unchanged from previous note.   PHYSICAL EXAMINATION:  Vitals:   09/01/18 0454 09/01/18 1452  BP: (!) 142/60 (!) 161/64  Pulse: (!) 57 (!) 57  Resp: 18 17  Temp: 98.2 F (36.8 C) 98.4 F (36.9 C)  SpO2: 99% 100%   Filed Weights   08/29/18 1310  Weight: 133 lb 6.1 oz (60.5 kg)    Intake/Output from previous day: 05/11 0701 - 05/12 0700 In: 460 [P.O.:460] Out: -   GENERAL:alert, no distress and comfortable SKIN: skin color, texture, turgor are normal, no rashes or significant lesions EYES: normal, Conjunctiva are pink and non-injected, sclera clear OROPHARYNX:no exudate, no erythema and lips, buccal mucosa, and tongue normal  NECK: supple, thyroid normal size, non-tender, without nodularity LYMPH:  no palpable lymphadenopathy in the cervical, axillary or inguinal LUNGS: clear to auscultation and percussion with normal breathing effort HEART: regular rate & rhythm and no murmurs and no  lower extremity edema ABDOMEN: Normoactive bowel sounds, abdomen slightly distended. Musculoskeletal:no cyanosis of digits and no clubbing  NEURO: alert & oriented x 3 with fluent speech, no focal motor/sensory deficits  LABORATORY  DATA:  I have reviewed the data as listed CMP Latest Ref Rng & Units 08/31/2018 08/30/2018 08/29/2018  Glucose 70 - 99 mg/dL 178(H) 197(H) 155(H)  BUN 8 - 23 mg/dL 12 17 21   Creatinine 0.44 - 1.00 mg/dL 1.50(H) 1.67(H) 1.66(H)  Sodium 135 - 145 mmol/L 138 138 137  Potassium 3.5 - 5.1 mmol/L 4.2 4.7 4.6  Chloride 98 - 111 mmol/L 107 106 107  CO2 22 - 32 mmol/L 22 24 22   Calcium 8.9 - 10.3 mg/dL 8.2(L) 8.4(L) 8.9  Total Protein 6.5 - 8.1 g/dL - 5.1(L) 6.0(L)  Total Bilirubin 0.3 - 1.2 mg/dL - 0.6 0.9  Alkaline Phos 38 - 126 U/L - 58 67  AST 15 - 41 U/L - 21 28  ALT 0 - 44 U/L - 33 43    Lab Results  Component Value Date   WBC 4.8 08/31/2018   HGB 10.4 (L) 08/31/2018   HCT 33.0 (L) 08/31/2018   MCV 94.6 08/31/2018   PLT 186 08/31/2018   NEUTROABS 5.7 08/29/2018    Ct Abdomen Pelvis Wo Contrast  Result Date: 08/29/2018 CLINICAL DATA:  Generalized abdominal pain EXAM: CT ABDOMEN AND PELVIS WITHOUT CONTRAST TECHNIQUE: Multidetector CT imaging of the abdomen and pelvis was performed following the standard protocol without IV contrast. COMPARISON:  PET-CT, 04/03/2018 FINDINGS: Lower chest: No acute abnormality.  Coronary artery calcifications. Hepatobiliary: No focal liver abnormality is seen. Status post biliary duct stenting with biliary ductal dilatation and pneumobilia. Gallstones in the gallbladder. Pancreas: Unremarkable. No pancreatic ductal dilatation or surrounding inflammatory changes. Spleen: Normal in size without focal abnormality. Adrenals/Urinary Tract: Adrenal glands are unremarkable. Status post left nephrectomy. Bladder is unremarkable. Stomach/Bowel: Stomach is within normal limits. Appendix appears normal. No evidence of bowel wall thickening, distention, or inflammatory changes. Large burden of dense stool in the colon. Pancolonic diverticulosis. Very redundant sigmoid colon. Vascular/Lymphatic: Calcific atherosclerosis. No enlarged abdominal or pelvic lymph nodes. Reproductive:  No mass or other abnormality. Other: No abdominal wall hernia or abnormality. No abdominopelvic ascites. Musculoskeletal: No acute or significant osseous findings. IMPRESSION: 1. No definite acute non-contrast CT findings to explain abdominal pain. 2. Status post biliary ductal stenting with biliary ductal dilatation and pneumobilia. Gallstones in the gallbladder. 3. Large burden of dense stool in the colon. Pancolonic diverticulosis. Very redundant sigmoid colon. 4.  Status post left nephrectomy. Electronically Signed   By: Eddie Candle M.D.   On: 08/29/2018 16:04   Dg Chest 2 View  Result Date: 08/30/2018 CLINICAL DATA:  Preoperative chest radiograph prior to cholecystectomy. EXAM: CHEST - 2 VIEW COMPARISON:  05/05/2018 FINDINGS: UPPER limits normal heart size and RIGHT IJ Port-A-Cath with tip overlying the mid SVC again noted. There is no evidence of focal airspace disease, pulmonary edema, suspicious pulmonary nodule/mass, pleural effusion, or pneumothorax. No acute bony abnormalities are identified. IMPRESSION: UPPER limits normal heart size without evidence of acute cardiopulmonary disease. Electronically Signed   By: Margarette Canada M.D.   On: 08/30/2018 11:41   Dg Abd 1 View  Result Date: 08/19/2018 CLINICAL DATA:  Generalized abdominal pain and distention. Constipation. EXAM: ABDOMEN - 1 VIEW COMPARISON:  None. FINDINGS: Nonobstructive pattern of bowel gas with gas present to the rectum. Large burden of stool and stool balls in the left and right colon. No free  air in the abdomen on supine radiographs. Calcific atherosclerosis about the abdomen. IMPRESSION: Nonobstructive pattern of bowel gas with gas present to the rectum. Large burden of stool and stool balls in the left and right colon. No free air in the abdomen on supine radiographs. Calcific atherosclerosis about the abdomen. Electronically Signed   By: Eddie Candle M.D.   On: 08/19/2018 11:40   Mr Abdomen Mrcp Wo Contrast  Result Date:  08/06/2018 CLINICAL DATA:  78 year old female with history of renal cell carcinoma status post left nephrectomy and chemotherapy. Abdominal pain for 1 month. Biliary ductal dilatation noted on recent ultrasound examination. EXAM: MRI ABDOMEN WITHOUT CONTRAST  (INCLUDING MRCP) TECHNIQUE: Multiplanar multisequence MR imaging of the abdomen was performed. Heavily T2-weighted images of the biliary and pancreatic ducts were obtained, and three-dimensional MRCP images were rendered by post processing. COMPARISON:  No priors. FINDINGS: Comment: Today's study is limited for detection and characterization of visceral and/or vascular lesions by lack of IV gadolinium. Lower chest: Small hiatal hernia. Hepatobiliary: No definite suspicious hepatic lesions are confidently identified on today's noncontrast examination. MRCP images demonstrate multiple filling defects within the gallbladder, compatible with cholelithiasis. Gallbladder does not appear distended. Gallbladder wall does appear thickened and edematous, but there is no pericholecystic fluid noted at this time. There is mild intrahepatic biliary ductal dilatation, as well as some mild periductal increased T2 signal intensity which is most compatible with periportal edema. Common bile duct is dilated and measures up to 11 mm in the porta hepatis. In the distal common bile duct there is long segment narrowing of the distal common bile duct measuring 11 mm in length shortly before the level of the ampulla, concerning for a stricture (coronal image 9 of series 13). No definite filling defect within the common bile duct to suggest choledocholithiasis. Pancreas: No pancreatic mass confidently identified on today's noncontrast examination. No pancreatic ductal dilatation noted on MRCP images. No pancreatic or peripancreatic fluid or inflammatory changes. Spleen:  Unremarkable. Adrenals/Urinary Tract: Status post left nephrectomy. No abnormal soft tissue mass in the nephrectomy  bed to suggest locally recurrent disease. Multiple ovoid shaped T1 hypointense, T2 hyperintense lesions associated with the right renal pelvis, presumably parapelvic cysts. No right hydroureteronephrosis. Right adrenal gland is normal in appearance. Left adrenal gland is not confidently identified may be surgically absent. Stomach/Bowel: Visualized portions are unremarkable. Vascular/Lymphatic: Aortic atherosclerosis without definite aneurysm in the abdominal vasculature. No lymphadenopathy noted in the abdomen. Other: No significant volume of ascites noted in the visualized portions of the peritoneal cavity. Musculoskeletal: No aggressive appearing osseous lesions are noted in the visualized portions of the skeleton. IMPRESSION: 1. Distal common bile duct stricture measuring approximately 11 mm in length shortly before the level of the ampulla. This is associated with some mild intrahepatic and moderate extrahepatic biliary ductal dilatation, as well as some periportal edema in the hepatic parenchyma. 2. No choledocholithiasis. 3. Cholelithiasis with some gallbladder wall edema, but no frank evidence to strongly suggest an acute cholecystitis at this time. Electronically Signed   By: Vinnie Langton M.D.   On: 08/06/2018 12:02   Dg Ercp Biliary & Pancreatic Ducts  Result Date: 08/27/2018 CLINICAL DATA:  78 year old female with common bile duct dilation EXAM: ERCP TECHNIQUE: Multiple spot images obtained with the fluoroscopic device and submitted for interpretation post-procedure. FLUOROSCOPY TIME:  Fluoroscopy Time:  7 minutes COMPARISON:  MR 08/06/2018 FINDINGS: Limited intraoperative fluoroscopic spot images of ERCP. Initial image demonstrates endoscope projecting over the upper abdomen with cannulation of  the ampulla and retrograde contrast. Borderline dilated extrahepatic biliary ducts. There is then deployment of a retrieval balloon. Final image demonstrates placement of 2 parallel plastic biliary stents.  IMPRESSION: Limited images of ERCP demonstrates partial opacification of the extrahepatic biliary ducts, deployment of a retrieval balloon, and placement of parallel plastic biliary stents. Please refer to the dictated operative report for full details of intraoperative findings and procedure. Electronically Signed   By: Corrie Mckusick D.O.   On: 08/27/2018 15:34   Dg Ercp Biliary & Pancreatic Ducts  Result Date: 08/10/2018 CLINICAL DATA:  Biliary stricture EXAM: ERCP TECHNIQUE: Multiple spot images obtained with the fluoroscopic device and submitted for interpretation post-procedure. FLUOROSCOPY TIME:  Fluoroscopy Time:  7 minutes and 10 seconds Radiation Exposure Index (if provided by the fluoroscopic device): Number of Acquired Spot Images: 0 COMPARISON:  None. FINDINGS: Imaging demonstrates cannulation of the common bile duct and contrast filling the biliary tree. Balloon stone retrieval is documented. A temporary biliary stent has been placed across the ampulla of Vater. IMPRESSION: See above. These images were submitted for radiologic interpretation only. Please see the procedural report for the amount of contrast and the fluoroscopy time utilized. Electronically Signed   By: Marybelle Killings M.D.   On: 08/10/2018 14:25   US Abdomen Limited Ruq  Result Date: 08/29/2018 CLINICAL DATA:  Abdominal pain EXAM: ULTRASOUND ABDOMEN LIMITED RIGHT UPPER QUADRANT COMPARISON:  CT from earlier the same. FINDINGS: Gallbladder: Gallbladder is well distended with multiple gallstones within. Wall thickness is at the upper limits of normal. Common bile duct: Stent is noted within the common bile duct. It is true diameter is difficult to assess due to the stent placement Liver: No focal lesion identified. Within normal limits in parenchymal echogenicity. Portal vein is patent on color Doppler imaging with normal direction of blood flow towards the liver. IMPRESSION: Common bile duct stents in place. No definitive biliary  ductal dilatation is seen. The changes on the prior CT likely related to incomplete opacification of portal vein. Cholelithiasis with upper limits normal wall thickening Electronically Signed   By: Inez Catalina M.D.   On: 08/29/2018 17:26    ASSESSMENT AND PLAN: 78 y.o. 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 PET/CT 06/19/2017 -- consistent with partial metabolic response to treatment.  Rt adrenal gland bx - showed clear cell RCC  S/p CYtoreductive left radical nephrectomy and left adrenal gland resection on 08/01/2016 by Dr Alinda Money.  10/16/17 PET/CT revealed Continued improvement, with the T7 metastatic lesion no longer significantly hypermetabolic. The previous right lower lobe pulmonary nodule is even less apparent, perhaps about 2 mm in diameter today; given that this measured 6 mm on 03/28/2017 this probably represents an effectively treated metastatic lesion. Currently no appreciable hypermetabolic activity is identified to suggest active malignancy. Distended gallbladder with gallbladder wall thickening and gallstones. Correlate clinically in assessing for cholecystitis. Small but abnormal amount of free pelvic fluid, nonspecific. Other imaging findings of potential clinical significance: Chronic ethmoid sinusitis. Aortic Atherosclerosis. Stable 5 mm right middle lobe pulmonary nodule, not hypermetabolic but below sensitive PET-CT size thresholds. Left nephrectomy. Notable pelvic floor laxity with cystocele. Chronic bilateral Sacroiliitis.  04/03/18 PET/CT revealed No evidence for new or progressive hypermetabolic disease on today's study to suggest metastatic progression. 2. Stable appearance of the T7 metastatic lesion without Hypermetabolism. 3. Tiny focus of FDG accumulation identified along the skin of the  low right inguinal fold. No associated lesion  evident on CT. This may be related to urinary contaminant. 4. Cholelithiasis with similar appearance of diffuse gallbladder wall thickening. 5. Stable 5 mm right middle lobe pulmonary nodule. 6. Aortic Atherosclerois. 7. Diffuse colonic diverticulosis.   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  #3 Small pulmonary lesions -- 10/16/17 PET/CT showed pulmonary less apparent than 03/28/17 PET/CT, decreasing from 86mm to 33mm diameter.   MRI brain shows no evidence of metastatic disease  #4 T7/8 Bone metastases - received Xgeva every 4 weeks from May 2018 to June 2019. 10/16/17 PET/CT revealed improvement with T7 metastatic lesion no longer significantly hypermetabolic.  -on Marchelle Folks  #5 ?liver mets- rpt PET/CT from 10/16/2017 shows no overt evidence of metastatic disease in the liver.  #6 Acute on chronic renal insufficiency Creatine stable 1.50  #7 Choledocholithiasis and cholelithiasis 07/10/18 US Abdomen revealed Biliary duct dilatation with 8 mm calculus at the level of the ampulla in the distal common bile duct. 2. Cholelithiasis. No gallbladder wall thickening or pericholecystic fluid. 3. Appearance of the liver raises concern for underlying hepatic cirrhosis. No focal liver lesions are demonstrable. 4. Left kidney absent. 5. Small cysts in right kidney. 6. Aortic Atherosclerosis.  S/p ERCP on 08/10/18 and 08/27/2018 with Dr. Justice Britain.  Bile duct brushings suspicious for malignancy.   PLAN:  -The patient has done well with regards to her metastatic renal cell carcinoma.  She has had a response to her immunotherapy.  From our standpoint, she has a good performance status and would recommend surgery for her cholecystitis. -Discussed recent bile duct brushings suspicious for malignancy with the patient.  Will await pathology from her cholecystectomy tomorrow. -Lab work has been reviewed and remain stable.   LOS: 3  days   Mikey Bussing, DNP, AGPCNP-BC, AOCNP 09/01/18

## 2018-09-01 NOTE — Progress Notes (Signed)
Tentatively plan lap chole tomorrow. This could get bumped until Thursday if emergencies come in.    NPO after MN Hold heparin after last dose today. On Cipro.    EKG from 5/10.   Cards note from 08/14/18 Dr. Percival Spanish and NP update 5/12.    Get coags and type and screen.

## 2018-09-01 NOTE — Progress Notes (Signed)
          Daily Rounding Note  09/01/2018, 2:11 PM  LOS: 3 days   SUBJECTIVE:   Chief complaint: Abd pain.  Cholelithiasis.  Choledocholithiasis.      Pain persists.  Pt pleased that Dr Barry Dienes is planning surgery tomorrow.    OBJECTIVE:         Vital signs in last 24 hours:    Temp:  [98.2 F (36.8 C)-98.7 F (37.1 C)] 98.2 F (36.8 C) (05/12 0454) Pulse Rate:  [57-66] 57 (05/12 0454) Resp:  [16-18] 18 (05/12 0454) BP: (142-147)/(60-64) 142/60 (05/12 0454) SpO2:  [99 %] 99 % (05/12 0454) Last BM Date: 08/30/18 Filed Weights   08/29/18 1310  Weight: 60.5 kg   General: looks well   Heart: RRR Chest: clear with no labored breathing.   Abdomen: soft, active BS. Tender RUQ without guard/ rebound.     Extremities: no CCE.   Neuro/Psych:  Oriented x 3.  Fully alert.  In excellent spirits.  No gross deficits.    Intake/Output from previous day: 05/11 0701 - 05/12 0700 In: 460 [P.O.:460] Out: -   Intake/Output this shift: No intake/output data recorded.  Lab Results: Recent Labs    08/29/18 1430 08/30/18 0209 08/31/18 0205  WBC 7.9 6.2 4.8  HGB 11.6* 10.3* 10.4*  HCT 36.7 32.7* 33.0*  PLT 214 193 186   BMET Recent Labs    08/29/18 1430 08/30/18 0209 08/31/18 0205  NA 137 138 138  K 4.6 4.7 4.2  CL 107 106 107  CO2 22 24 22   GLUCOSE 155* 197* 178*  BUN 21 17 12   CREATININE 1.66* 1.67* 1.50*  CALCIUM 8.9 8.4* 8.2*   LFT Recent Labs    08/29/18 1430 08/30/18 0209  PROT 6.0* 5.1*  ALBUMIN 3.6 3.0*  AST 28 21  ALT 43 33  ALKPHOS 67 58  BILITOT 0.9 0.6     ASSESMENT:   *   Choledocholithiasis.  Gallstones.   ERCPs 4/2, 4/20, 5/7 (with EUS/FNA) with multiple stent placements, distal CBD narrowing/upstream dilation.  Duct brushings "atypical cells" .  FNA cytology "suspicious for cancer".   Dr Barry Dienes planning lap chole tomorrow.    *   Renal cell cancer, metastatic.  Prior left nephrectomy.  No  chemo for > 1 month due to elevated LFTs.  LFTs are now normal for the last 4 days..      *   Normocytic anemia.     *     AKI.   PLAN   *  Cholecystectomy tomorrow.      Azucena Freed  09/01/2018, 2:11 PM Phone [phone]

## 2018-09-01 NOTE — Progress Notes (Signed)
    Cardiology consulted to provide for pre op evaluation for cholecystectomy. In review of chart she was evaluated by Dr. Percival Spanish as an outpatient back on 08/14/18 for a preop exam. Had undergone recent stress testing with inferior infarct consistent with prior infarct. Normal EF on echo. She was consider low risk per note by the modified Lee criteria. Refer back to office note for further details. Please call with further questions.   SignedReino Bellis, NP-C 09/01/2018, 2:07 PM Pager: [phone]

## 2018-09-01 NOTE — Progress Notes (Signed)
[facility] Progress Note     Subjective: CC-  Patient reports persistent abdominal pain. States that she tried eating spaghetti yesterday but it made her RUQ pain worse. Denies n/v. Feels bloated. Despite miralax BID and stool softeners she did not have a BM yesterday. Passing flatus.  Does not think she could go home in the state she is currently in. Adamant that she would not consider percutaneous cholecystostomy tube.  Objective: Vital signs in last 24 hours: Temp:  [98.2 F (36.8 C)-98.7 F (37.1 C)] 98.2 F (36.8 C) (05/12 0454) Pulse Rate:  [57-66] 57 (05/12 0454) Resp:  [16-18] 18 (05/12 0454) BP: (142-147)/(60-64) 142/60 (05/12 0454) SpO2:  [99 %] 99 % (05/12 0454) Last BM Date: 08/30/18  Intake/Output from previous day: 05/11 0701 - 05/12 0700 In: 460 [P.O.:460] Out: -  Intake/Output this shift: No intake/output data recorded.  PE: Gen:  Alert, NAD, tearful at times HEENT: EOM's intact, pupils equal and round Pulm:  CTAB, no W/R/R, effort normal Abd: Soft, mild distension, mild RUQ and lower abdominal TTP without rebound or guarding, +BS, no HSM, soft/reducible umbilical hernia Ext: calves soft and nontender Psych: A&Ox3  Skin: no rashes noted, warm and dry   Lab Results:  Recent Labs    08/30/18 0209 08/31/18 0205  WBC 6.2 4.8  HGB 10.3* 10.4*  HCT 32.7* 33.0*  PLT 193 186   BMET Recent Labs    08/30/18 0209 08/31/18 0205  NA 138 138  K 4.7 4.2  CL 106 107  CO2 24 22  GLUCOSE 197* 178*  BUN 17 12  CREATININE 1.67* 1.50*  CALCIUM 8.4* 8.2*   PT/INR No results for input(s): LABPROT, INR in the last 72 hours. CMP     Component Value Date/Time   NA 138 08/31/2018 0205   NA 140 04/04/2017 0830   K 4.2 08/31/2018 0205   K 4.1 04/04/2017 0830   CL 107 08/31/2018 0205   CO2 22 08/31/2018 0205   CO2 21 (L) 04/04/2017 0830   GLUCOSE 178 (H) 08/31/2018 0205   GLUCOSE 107 04/04/2017 0830   BUN 12 08/31/2018 0205   BUN 37.0 (H)  04/04/2017 0830   CREATININE 1.50 (H) 08/31/2018 0205   CREATININE 1.75 (H) 08/28/2018 1017   CREATININE 2.2 (H) 04/04/2017 0830   CALCIUM 8.2 (L) 08/31/2018 0205   CALCIUM 10.1 04/04/2017 0830   PROT 5.1 (L) 08/30/2018 0209   PROT 7.5 04/04/2017 0830   ALBUMIN 3.0 (L) 08/30/2018 0209   ALBUMIN 4.0 04/04/2017 0830   AST 21 08/30/2018 0209   AST 18 08/28/2018 1017   AST 19 04/04/2017 0830   ALT 33 08/30/2018 0209   ALT 52 (H) 08/28/2018 1017   ALT 12 04/04/2017 0830   ALKPHOS 58 08/30/2018 0209   ALKPHOS 65 04/04/2017 0830   BILITOT 0.6 08/30/2018 0209   BILITOT 0.4 08/28/2018 1017   BILITOT 0.34 04/04/2017 0830   GFRNONAA 33 (L) 08/31/2018 0205   GFRNONAA 28 (L) 08/28/2018 1017   GFRAA 39 (L) 08/31/2018 0205   GFRAA 32 (L) 08/28/2018 1017   Lipase     Component Value Date/Time   LIPASE 31 08/29/2018 1430       Studies/Results: Dg Chest 2 View  Result Date: 08/30/2018 CLINICAL DATA:  Preoperative chest radiograph prior to cholecystectomy. EXAM: CHEST - 2 VIEW COMPARISON:  05/05/2018 FINDINGS: UPPER limits normal heart size and RIGHT IJ Port-A-Cath with tip overlying the mid SVC again noted. There is no evidence of  focal airspace disease, pulmonary edema, suspicious pulmonary nodule/mass, pleural effusion, or pneumothorax. No acute bony abnormalities are identified. IMPRESSION: UPPER limits normal heart size without evidence of acute cardiopulmonary disease. Electronically Signed   By: Margarette Canada M.D.   On: 08/30/2018 11:41    Anti-infectives: Anti-infectives (From admission, onward)   Start     Dose/Rate Route Frequency Ordered Stop   08/29/18 2200  ciprofloxacin (CIPRO) IVPB 400 mg     400 mg 200 mL/hr over 60 Minutes Intravenous Every 24 hours 08/29/18 1955     08/29/18 2000  metroNIDAZOLE (FLAGYL) IVPB 500 mg     500 mg 100 mL/hr over 60 Minutes Intravenous Every 8 hours 08/29/18 1955         Assessment/Plan Metastatic renal cell carcinoma stable - just  completed chemotherapy Constipation H/o vaginal and rectal prolapse HTN HLD Hypothyroidism GERD  Gallstones History choledocholithiasis status post ERCP x3 - most recent ERCP 5/7 with 3 bile duct brushings, 1 collected suspicious for malignancy - Persistent abdominal pain could be multifactorial from gallbladder and constipation. Add daily suppository for constipation and continue miralax/dulcolax.  Again discussed possibility of perc chole tube if RUQ pain persists but patient states she will not consider this, states that it would ruin her quality of life and she just wants her gallbladder removed. Discussed why this is not a safe idea at this time given atypical cells being present in setting of bile duct stricture and the possibility of underlying malignancy in bile duct, and our recommendation would be to coordinate an outpatient procedure with hepatobiliary surgeon Dr. Barry Dienes in case she needs more extensive surgery than just a cholecystectomy. Continue HH diet today and work on bowel regimen.   ID - cipro/flagyl 5/9>> FEN - HH diet, miralax BID and dulcolax and suppository VTE - SCDs, sq heparin Foley - none Follow up - TBD Contact: son Elberta Fortis [phone]), daughter Daleen Snook [phone])    LOS: 3 days    Wellington Hampshire , [facility] 09/01/2018, 8:06 AM Pager: [phone] Mon-Thurs 7:00 am-4:30 pm Fri 7:00 am -11:30 AM Sat-Sun 7:00 am-11:30 am

## 2018-09-02 ENCOUNTER — Encounter (HOSPITAL_COMMUNITY): Payer: Self-pay | Admitting: Certified Registered"

## 2018-09-02 ENCOUNTER — Encounter (HOSPITAL_COMMUNITY)
Admission: EM | Disposition: A | Discharge status: Home / Self Care | Payer: Self-pay | Source: Home / Self Care | Attending: Internal Medicine

## 2018-09-02 ENCOUNTER — Inpatient Hospital Stay (HOSPITAL_COMMUNITY): Payer: Medicare Other | Admitting: Certified Registered"

## 2018-09-02 HISTORY — PX: CHOLECYSTECTOMY: SHX55

## 2018-09-02 LAB — COMPREHENSIVE METABOLIC PANEL
ALT: 23 U/L (ref 0–44)
AST: 24 U/L (ref 15–41)
Albumin: 2.6 g/dL — ABNORMAL LOW (ref 3.5–5.0)
Alkaline Phosphatase: 53 U/L (ref 38–126)
Anion gap: 5 (ref 5–15)
BUN: 9 mg/dL (ref 8–23)
CO2: 23 mmol/L (ref 22–32)
Calcium: 8 mg/dL — ABNORMAL LOW (ref 8.9–10.3)
Chloride: 111 mmol/L (ref 98–111)
Creatinine, Ser: 1.45 mg/dL — ABNORMAL HIGH (ref 0.44–1.00)
GFR calc Af Amer: 40 mL/min — ABNORMAL LOW (ref >60)
GFR calc non Af Amer: 35 mL/min — ABNORMAL LOW (ref >60)
Glucose, Bld: 191 mg/dL — ABNORMAL HIGH (ref 70–99)
Potassium: 4.1 mmol/L (ref 3.5–5.1)
Sodium: 139 mmol/L (ref 135–145)
Total Bilirubin: 0.3 mg/dL (ref 0.3–1.2)
Total Protein: 5 g/dL — ABNORMAL LOW (ref 6.5–8.1)

## 2018-09-02 LAB — CBC
HCT: 32.1 % — ABNORMAL LOW (ref 36.0–46.0)
Hemoglobin: 10 g/dL — ABNORMAL LOW (ref 12.0–15.0)
MCH: 29.9 pg (ref 26.0–34.0)
MCHC: 31.2 g/dL (ref 30.0–36.0)
MCV: 96.1 fL (ref 80.0–100.0)
Platelets: 187 10*3/uL (ref 150–400)
RBC: 3.34 MIL/uL — ABNORMAL LOW (ref 3.87–5.11)
RDW: 13.8 % (ref 11.5–15.5)
WBC: 4.7 10*3/uL (ref 4.0–10.5)
nRBC: 0 % (ref 0.0–0.2)

## 2018-09-02 LAB — GLUCOSE, CAPILLARY
Glucose-Capillary: 185 mg/dL — ABNORMAL HIGH (ref 70–99)
Glucose-Capillary: 223 mg/dL — ABNORMAL HIGH (ref 70–99)
Glucose-Capillary: 191 mg/dL — ABNORMAL HIGH (ref 70–99)

## 2018-09-02 SURGERY — LAPAROSCOPIC CHOLECYSTECTOMY
Anesthesia: General | Site: Abdomen

## 2018-09-02 MED ORDER — ROCURONIUM BROMIDE 10 MG/ML (PF) SYRINGE
PREFILLED_SYRINGE | INTRAVENOUS | Status: DC | PRN
  Administered 2018-09-02: 50 mg via INTRAVENOUS

## 2018-09-02 MED ORDER — INSULIN ASPART 100 UNIT/ML ~~LOC~~ SOLN
SUBCUTANEOUS | Status: AC
  Administered 2018-09-02: 5 [IU] via SUBCUTANEOUS
  Filled 2018-09-02: qty 1

## 2018-09-02 MED ORDER — INSULIN ASPART 100 UNIT/ML ~~LOC~~ SOLN
5.0000 [IU] | Freq: Once | SUBCUTANEOUS | Status: AC
  Administered 2018-09-02: 5 [IU] via SUBCUTANEOUS

## 2018-09-02 MED ORDER — OXYCODONE HCL 5 MG PO TABS
5.0000 mg | ORAL_TABLET | Freq: Once | ORAL | Status: AC | PRN
  Administered 2018-09-02: 5 mg via ORAL

## 2018-09-02 MED ORDER — SUGAMMADEX SODIUM 200 MG/2ML IV SOLN
INTRAVENOUS | Status: DC | PRN
  Administered 2018-09-02: 200 mg via INTRAVENOUS

## 2018-09-02 MED ORDER — PHENYLEPHRINE 40 MCG/ML (10ML) SYRINGE FOR IV PUSH (FOR BLOOD PRESSURE SUPPORT)
PREFILLED_SYRINGE | INTRAVENOUS | Status: DC | PRN
  Administered 2018-09-02: 80 ug via INTRAVENOUS

## 2018-09-02 MED ORDER — HEPARIN SODIUM (PORCINE) 5000 UNIT/ML IJ SOLN
5000.0000 [IU] | Freq: Three times a day (TID) | INTRAMUSCULAR | Status: DC
  Filled 2018-09-02: qty 1

## 2018-09-02 MED ORDER — LIDOCAINE 2% (20 MG/ML) 5 ML SYRINGE
INTRAMUSCULAR | Status: AC
  Filled 2018-09-02: qty 5

## 2018-09-02 MED ORDER — ONDANSETRON HCL 4 MG/2ML IJ SOLN
INTRAMUSCULAR | Status: DC | PRN
  Administered 2018-09-02: 4 mg via INTRAVENOUS

## 2018-09-02 MED ORDER — DEXAMETHASONE SODIUM PHOSPHATE 10 MG/ML IJ SOLN
INTRAMUSCULAR | Status: DC | PRN
  Administered 2018-09-02: 5 mg via INTRAVENOUS

## 2018-09-02 MED ORDER — ACETAMINOPHEN 500 MG PO TABS
ORAL_TABLET | ORAL | Status: AC
  Administered 2018-09-02: 1000 mg
  Filled 2018-09-02: qty 2

## 2018-09-02 MED ORDER — PROPOFOL 10 MG/ML IV BOLUS
INTRAVENOUS | Status: AC
  Filled 2018-09-02: qty 20

## 2018-09-02 MED ORDER — LIDOCAINE HCL (PF) 1 % IJ SOLN
INTRAMUSCULAR | Status: AC
  Filled 2018-09-02: qty 30

## 2018-09-02 MED ORDER — LIDOCAINE 2% (20 MG/ML) 5 ML SYRINGE
INTRAMUSCULAR | Status: DC | PRN
  Administered 2018-09-02: 60 mg via INTRAVENOUS

## 2018-09-02 MED ORDER — ONDANSETRON HCL 4 MG/2ML IJ SOLN
INTRAMUSCULAR | Status: AC
  Filled 2018-09-02: qty 2

## 2018-09-02 MED ORDER — 0.9 % SODIUM CHLORIDE (POUR BTL) OPTIME
TOPICAL | Status: DC | PRN
  Administered 2018-09-02: 1000 mL

## 2018-09-02 MED ORDER — FENTANYL CITRATE (PF) 100 MCG/2ML IJ SOLN
INTRAMUSCULAR | Status: AC
  Administered 2018-09-02: 50 ug via INTRAVENOUS
  Filled 2018-09-02: qty 2

## 2018-09-02 MED ORDER — DEXAMETHASONE SODIUM PHOSPHATE 10 MG/ML IJ SOLN
INTRAMUSCULAR | Status: AC
  Filled 2018-09-02: qty 1

## 2018-09-02 MED ORDER — GABAPENTIN 300 MG PO CAPS
ORAL_CAPSULE | ORAL | Status: AC
  Filled 2018-09-02: qty 1

## 2018-09-02 MED ORDER — PROPOFOL 10 MG/ML IV BOLUS
INTRAVENOUS | Status: DC | PRN
  Administered 2018-09-02: 100 mg via INTRAVENOUS

## 2018-09-02 MED ORDER — BUPIVACAINE-EPINEPHRINE (PF) 0.25% -1:200000 IJ SOLN
INTRAMUSCULAR | Status: AC
  Filled 2018-09-02: qty 30

## 2018-09-02 MED ORDER — FENTANYL CITRATE (PF) 100 MCG/2ML IJ SOLN
INTRAMUSCULAR | Status: DC | PRN
  Administered 2018-09-02 (×5): 50 ug via INTRAVENOUS

## 2018-09-02 MED ORDER — ROCURONIUM BROMIDE 10 MG/ML (PF) SYRINGE
PREFILLED_SYRINGE | INTRAVENOUS | Status: AC
  Filled 2018-09-02: qty 10

## 2018-09-02 MED ORDER — LIDOCAINE HCL 1 % IJ SOLN
INTRAMUSCULAR | Status: DC | PRN
  Administered 2018-09-02: 21 mL

## 2018-09-02 MED ORDER — FENTANYL CITRATE (PF) 100 MCG/2ML IJ SOLN
25.0000 ug | INTRAMUSCULAR | Status: DC | PRN
  Administered 2018-09-02 (×2): 50 ug via INTRAVENOUS

## 2018-09-02 MED ORDER — ONDANSETRON HCL 4 MG/2ML IJ SOLN: 4.0000 mg | Freq: Once | INTRAMUSCULAR | Status: DC | PRN

## 2018-09-02 MED ORDER — SODIUM CHLORIDE 0.9 % IR SOLN
Status: DC | PRN
  Administered 2018-09-02: 1000 mL

## 2018-09-02 MED ORDER — OXYCODONE HCL 5 MG/5ML PO SOLN: 5.0000 mg | Freq: Once | ORAL | Status: AC | PRN

## 2018-09-02 MED ORDER — CEFAZOLIN SODIUM-DEXTROSE 2-4 GM/100ML-% IV SOLN
INTRAVENOUS | Status: AC
  Filled 2018-09-02: qty 100

## 2018-09-02 MED ORDER — LACTATED RINGERS IV SOLN
INTRAVENOUS | Status: DC
  Administered 2018-09-02 (×2): via INTRAVENOUS

## 2018-09-02 MED ORDER — CEFAZOLIN SODIUM-DEXTROSE 2-3 GM-%(50ML) IV SOLR
INTRAVENOUS | Status: DC | PRN
  Administered 2018-09-02: 2 g via INTRAVENOUS

## 2018-09-02 MED ORDER — MIDAZOLAM HCL 2 MG/2ML IJ SOLN
INTRAMUSCULAR | Status: AC
  Filled 2018-09-02: qty 2

## 2018-09-02 MED ORDER — FENTANYL CITRATE (PF) 250 MCG/5ML IJ SOLN
INTRAMUSCULAR | Status: AC
  Filled 2018-09-02: qty 5

## 2018-09-02 MED ORDER — OXYCODONE HCL 5 MG PO TABS
ORAL_TABLET | ORAL | Status: AC
  Filled 2018-09-02: qty 1

## 2018-09-02 SURGICAL SUPPLY — 45 items
APPLIER CLIP ROT 10 11.4 M/L (STAPLE) ×3
BLADE CLIPPER SURG (BLADE) IMPLANT
CANISTER SUCT 3000ML PPV (MISCELLANEOUS) ×3 IMPLANT
CHLORAPREP W/TINT 26ML (MISCELLANEOUS) ×3 IMPLANT
CLIP APPLIE ROT 10 11.4 M/L (STAPLE) ×2 IMPLANT
CLIP VESOLOCK XL 6/CT (CLIP) ×2 IMPLANT
COVER MAYO STAND STRL (DRAPES) ×3 IMPLANT
COVER SURGICAL LIGHT HANDLE (MISCELLANEOUS) ×3 IMPLANT
COVER WAND RF STERILE (DRAPES) ×3 IMPLANT
DERMABOND ADVANCED (GAUZE/BANDAGES/DRESSINGS) ×1
DERMABOND ADVANCED .7 DNX12 (GAUZE/BANDAGES/DRESSINGS) ×2 IMPLANT
DRAPE C-ARM 42X72 X-RAY (DRAPES) ×1 IMPLANT
DRAPE WARM FLUID 44X44 (DRAPE) ×1 IMPLANT
ELECT REM PT RETURN 9FT ADLT (ELECTROSURGICAL) ×3
ELECTRODE REM PT RTRN 9FT ADLT (ELECTROSURGICAL) ×2 IMPLANT
FILTER SMOKE EVAC LAPAROSHD (FILTER) IMPLANT
GLOVE BIO SURGEON STRL SZ 6 (GLOVE) ×3 IMPLANT
GLOVE INDICATOR 6.5 STRL GRN (GLOVE) ×3 IMPLANT
GOWN STRL REUS W/ TWL LRG LVL3 (GOWN DISPOSABLE) ×4 IMPLANT
GOWN STRL REUS W/TWL 2XL LVL3 (GOWN DISPOSABLE) ×3 IMPLANT
GOWN STRL REUS W/TWL LRG LVL3 (GOWN DISPOSABLE) ×2
KIT BASIN OR (CUSTOM PROCEDURE TRAY) ×3 IMPLANT
KIT TURNOVER KIT B (KITS) ×3 IMPLANT
L-HOOK LAP DISP 36CM (ELECTROSURGICAL) ×3
LHOOK LAP DISP 36CM (ELECTROSURGICAL) ×2 IMPLANT
NS IRRIG 1000ML POUR BTL (IV SOLUTION) ×3 IMPLANT
PAD ARMBOARD 7.5X6 YLW CONV (MISCELLANEOUS) ×3 IMPLANT
PENCIL BUTTON HOLSTER BLD 10FT (ELECTRODE) ×3 IMPLANT
POUCH RETRIEVAL ECOSAC 10 (ENDOMECHANICALS) ×2 IMPLANT
POUCH RETRIEVAL ECOSAC 10MM (ENDOMECHANICALS) ×1
SCISSORS LAP 5X35 DISP (ENDOMECHANICALS) ×3 IMPLANT
SET CHOLANGIOGRAPH 5 50 .035 (SET/KITS/TRAYS/PACK) ×1 IMPLANT
SET IRRIG TUBING LAPAROSCOPIC (IRRIGATION / IRRIGATOR) ×3 IMPLANT
SET TUBE SMOKE EVAC HIGH FLOW (TUBING) ×3 IMPLANT
SLEEVE ENDOPATH XCEL 5M (ENDOMECHANICALS) ×3 IMPLANT
SPECIMEN JAR SMALL (MISCELLANEOUS) ×3 IMPLANT
SUT MNCRL AB 4-0 PS2 18 (SUTURE) ×5 IMPLANT
SUT VICRYL 0 UR6 27IN ABS (SUTURE) ×2 IMPLANT
TOWEL OR 17X24 6PK STRL BLUE (TOWEL DISPOSABLE) ×3 IMPLANT
TOWEL OR 17X26 10 PK STRL BLUE (TOWEL DISPOSABLE) ×3 IMPLANT
TRAY LAPAROSCOPIC MC (CUSTOM PROCEDURE TRAY) ×3 IMPLANT
TROCAR XCEL BLUNT TIP 100MML (ENDOMECHANICALS) ×3 IMPLANT
TROCAR XCEL NON-BLD 11X100MML (ENDOMECHANICALS) ×3 IMPLANT
TROCAR XCEL NON-BLD 5MMX100MML (ENDOMECHANICALS) ×3 IMPLANT
WATER STERILE IRR 1000ML POUR (IV SOLUTION) ×3 IMPLANT

## 2018-09-02 NOTE — Interval H&P Note (Signed)
History and Physical Interval Note:  09/02/2018 10:49 AM  Audrey [name]  has presented today for surgery, with the diagnosis of Cholecystitis.  The various methods of treatment have been discussed with the patient and family. After consideration of risks, benefits and other options for treatment, the patient has consented to  Procedure(s): LAPAROSCOPIC CHOLECYSTECTOMY WITH  POSSIBLE INTRAOPERATIVE CHOLANGIOGRAM (N/A) as a surgical intervention.  The patient's history has been reviewed, patient examined, no change in status, stable for surgery.  I have reviewed the patient's chart and labs.  Questions were answered to the patient's satisfaction.     Stark Klein

## 2018-09-02 NOTE — Discharge Instructions (Signed)
[facility] ° °Please arrive at least 30 min before your appointment to complete your check in paperwork.  If you are unable to arrive 30 min prior to your appointment time we may have to cancel or reschedule you. °LAPAROSCOPIC SURGERY: POST OP INSTRUCTIONS °Always review your discharge instruction sheet given to you by the facility where your surgery was performed. °IF YOU HAVE DISABILITY OR FAMILY LEAVE FORMS, YOU MUST BRING THEM TO THE OFFICE FOR PROCESSING.   °DO NOT GIVE THEM TO YOUR DOCTOR. ° °PAIN CONTROL ° °1. First take acetaminophen (Tylenol) AND/or ibuprofen (Advil) to control your pain after surgery.  Follow directions on package.  Taking acetaminophen (Tylenol) and/or ibuprofen (Advil) regularly after surgery will help to control your pain and lower the amount of prescription pain medication you may need.  You should not take more than 4,000 mg (4 grams) of acetaminophen (Tylenol) in 24 hours.  You should not take ibuprofen (Advil), aleve, motrin, naprosyn or other NSAIDS if you have a history of stomach ulcers or chronic kidney disease.  °2. A prescription for pain medication may be given to you upon discharge.  Take your pain medication as prescribed, if you still have uncontrolled pain after taking acetaminophen (Tylenol) or ibuprofen (Advil). °3. Use ice packs to help control pain. °4. If you need a refill on your pain medication, please contact your pharmacy.  They will contact our office to request authorization. Prescriptions will not be filled after 5pm or on week-ends. ° °HOME MEDICATIONS °5. Take your usually prescribed medications unless otherwise directed. ° °DIET °6. You should follow a light diet the first few days after arrival home.  Be sure to include lots of fluids daily. Avoid fatty, fried foods.  ° °CONSTIPATION °7. It is common to experience some constipation after surgery and if you are taking pain medication.  Increasing fluid intake and taking a stool  softener (such as Colace) will usually help or prevent this problem from occurring.  A mild laxative (Milk of Magnesia or Miralax) should be taken according to package instructions if there are no bowel movements after 48 hours. ° °WOUND/INCISION CARE °8. Most patients will experience some swelling and bruising in the area of the incisions.  Ice packs will help.  Swelling and bruising can take several days to resolve.  °9. Unless discharge instructions indicate otherwise, follow guidelines below  °a. STERI-STRIPS - you may remove your outer bandages 48 hours after surgery, and you may shower at that time.  You have steri-strips (small skin tapes) in place directly over the incision.  These strips should be left on the skin for 7-10 days.   °b. DERMABOND/SKIN GLUE - you may shower in 24 hours.  The glue will flake off over the next 2-3 weeks. °10. Any sutures or staples will be removed at the office during your follow-up visit. ° °ACTIVITIES °11. You may resume regular (light) daily activities beginning the next day--such as daily self-care, walking, climbing stairs--gradually increasing activities as tolerated.  You may have sexual intercourse when it is comfortable.  Refrain from any heavy lifting or straining until approved by your doctor. °a. You may drive when you are no longer taking prescription pain medication, you can comfortably wear a seatbelt, and you can safely maneuver your car and apply brakes. ° °FOLLOW-UP °12. You should see your doctor in the office for a follow-up appointment approximately 2-3 weeks after your surgery.  You should have been given your post-op/follow-up appointment when   your surgery was scheduled.  If you did not receive a post-op/follow-up appointment, make sure that you call for this appointment within a day or two after you arrive home to insure a convenient appointment time. ° °OTHER INSTRUCTIONS ° °WHEN TO CALL YOUR DOCTOR: °1. Fever over 101.0 °2. Inability to  urinate °3. Continued bleeding from incision. °4. Increased pain, redness, or drainage from the incision. °5. Increasing abdominal pain ° °The clinic staff is available to answer your questions during regular business hours.  Please don’t hesitate to call and ask to speak to one of the nurses for clinical concerns.  If you have a medical emergency, go to the nearest emergency room or call 911.  A surgeon from [facility] is always on call at the hospital. °[address], [address], [place], [place]  [address] ? [address] [address], [place], [place]   [address] °[phone] ? [phone] ? FAX [phone] ° ° ° °

## 2018-09-02 NOTE — Anesthesia Procedure Notes (Signed)
Procedure Name: Intubation Date/Time: 09/02/2018 11:59 AM Performed by: Moshe Salisbury, CRNA Pre-anesthesia Checklist: Patient identified, Emergency Drugs available, Suction available and Patient being monitored Patient Re-evaluated:Patient Re-evaluated prior to induction Oxygen Delivery Method: Circle System Utilized Preoxygenation: Pre-oxygenation with 100% oxygen Induction Type: IV induction Ventilation: Mask ventilation without difficulty Laryngoscope Size: Mac and 3 Grade View: Grade I Tube type: Oral Tube size: 7.5 mm Number of attempts: 1 Airway Equipment and Method: Stylet and Oral airway Placement Confirmation: ETT inserted through vocal cords under direct vision,  positive ETCO2 and breath sounds checked- equal and bilateral Secured at: 21 cm Tube secured with: Tape Dental Injury: Teeth and Oropharynx as per pre-operative assessment

## 2018-09-02 NOTE — Care Management Important Message (Signed)
Important Message  Patient Details  Name: Audrey [name] MRN: [id number] Date of Birth: 05-15-1940   Medicare Important Message Given:  Yes    Kathalene Sporer Montine Circle 09/02/2018, 1:53 PM

## 2018-09-02 NOTE — Progress Notes (Signed)
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  For her metastatic RCC she underwent left nephrectomy and is on chemotherapy with Novolumab -Patient has had multiple ERCP for stent placement, she also had biliary sphincterotomy, had a stent which 1 of 2 was removed, she also had proximal dilation with cytology and multiple procedures. -She was evaluated by gastroenterology and admission was recommended for cholecystectomy, CT abdomen pelvis did not show any acute findings -Prior to admission underwent EUS with FNA on 5/7 multiple stent placement, distal CBD narrowing and upstream dilation noted duct brushings noted atypical cells suspicious for malignancy   Assessment & Plan:   1- Cholelithiasis; chronic cholecystitis with history of choledocholithiasis status post multiple ERCP x3, multiple stent placements -Last EUS with FNA 5/7: Duct brushings cytology showed atypical cells suspicious for cancer -GI and general surgery following -For lap chole today, LFTs are normal -remains on IV Cipro and Flagyl  2-metastatic renal cell cancer  -History of left nephrectomy, followed by oncology, on immunotherapy with nivolumab  -Chemotherapy has been on hold for a month due to abnormal LFTs  -Oncology following  3. Constipation:  -History of rectal prolapse and chronic constipation  -Continue MiraLAX, Dulcolax, patient had a small bowel movement following smog enema   4-Hypertension; continue with amlodipine and Coreg.  5-Vaginal and rectal prolapse; need to follow-up with her gynecologist.  Needs to follow-up with [facility].   6-Anemia; monitor level.  Hb stable.   DVT prophylaxis: Heparin held, resume this postop Code Status: Full code Family Communication: Care discussed with patient Disposition Plan: Home pending above work-up  Consultants:   Surgery  GI   Procedures:   None   Antimicrobials:  Ciprofloxacin and Flagyl 08/29/2018  Subjective: -Feels okay, anxious to be done with cholecystectomy  Objective: Vitals:   09/01/18 0454 09/01/18 1452 09/02/18 0453 09/02/18 0816  BP: (!) 142/60 (!) 161/64 (!) 149/56 (!) 160/68  Pulse: (!) 57 (!) 57 (!) 56 61  Resp: 18 17 14 16   Temp: 98.2 F (36.8 C) 98.4 F (36.9 C) 98.2 F (36.8 C) 98.4 F (36.9 C)  TempSrc: Oral Oral Oral Oral  SpO2: 99% 100%  100%  Weight:        Intake/Output Summary (Last 24 hours) at 09/02/2018 1058 Last data filed at 09/02/2018 0900 Gross per 24 hour  Intake 0 ml  Output -  Net 0 ml   Filed Weights   08/29/18 1310  Weight: 60.5 kg    Examination:  Gen: Awake, Alert, Oriented X 3, no distress HEENT: PERRLA, Neck supple, no JVD Lungs: Good air movement bilaterally, CTAB CVS: RRR,No Gallops,Rubs or new Murmurs Abd: soft, Non tender, non distended, BS present Extremities: No edema Skin: no new rashes  Data Reviewed: I have personally reviewed following labs and imaging studies  CBC: Recent Labs  Lab 08/28/18 1017 08/29/18 1430 08/30/18 0209 08/31/18 0205 09/02/18 0349  WBC 9.0 7.9 6.2 4.8 4.7  NEUTROABS 6.8 5.7  --   --   --   HGB 11.1* 11.6* 10.3* 10.4* 10.0*  HCT 34.6* 36.7 32.7* 33.0* 32.1*  MCV 93.5 95.3 94.5 94.6 96.1  PLT 232 214 193 186 187  Basic Metabolic Panel: Recent Labs  Lab 08/28/18 1017 08/29/18 1430 08/30/18 0209 08/31/18 0205 09/02/18 0349  NA 135 137 138 138 139  K 5.1 4.6 4.7 4.2 4.1  CL 105 107 106 107 111  CO2 21* 22 24 22 23   GLUCOSE 196* 155* 197* 178* 191*  BUN 26* 21 17 12 9   CREATININE 1.75* 1.66* 1.67* 1.50* 1.45*  CALCIUM 8.7* 8.9 8.4* 8.2* 8.0*   GFR:  Estimated Creatinine Clearance: 26.1 mL/min (A) (by C-G formula based on SCr of 1.45 mg/dL (H)). Liver Function Tests: Recent Labs  Lab 08/28/18 1017 08/29/18 1430 08/30/18 0209 09/02/18 0349  AST 18 28 21 24   ALT 52* 43 33 23  ALKPHOS 85 67 58 53  BILITOT 0.4 0.9 0.6 0.3  PROT 6.6 6.0* 5.1* 5.0*  ALBUMIN 3.7 3.6 3.0* 2.6*   Recent Labs  Lab 08/29/18 1430  LIPASE 31   No results for input(s): AMMONIA in the last 168 hours. Coagulation Profile: Recent Labs  Lab 09/01/18 1850  INR 1.1   Cardiac Enzymes: No results for input(s): CKTOTAL, CKMB, CKMBINDEX, TROPONINI in the last 168 hours. BNP (last 3 results) No results for input(s): PROBNP in the last 8760 hours. HbA1C: No results for input(s): HGBA1C in the last 72 hours. CBG: Recent Labs  Lab 08/27/18 1526 08/27/18 1557 08/27/18 1647  GLUCAP 305* 333* 275*   Lipid Profile: No results for input(s): CHOL, HDL, LDLCALC, TRIG, CHOLHDL, LDLDIRECT in the last 72 hours. Thyroid Function Tests: No results for input(s): TSH, T4TOTAL, FREET4, T3FREE, THYROIDAB in the last 72 hours. Anemia Panel: No results for input(s): VITAMINB12, FOLATE, FERRITIN, TIBC, IRON, RETICCTPCT in the last 72 hours. Sepsis Labs: No results for input(s): PROCALCITON, LATICACIDVEN in the last 168 hours.  Recent Results (from the past 240 hour(s))  SARS Coronavirus 2 (CEPHEID - Performed in [place] hospital lab), Hosp Order     Status: None   Collection Time: 08/29/18  7:15 PM  Result Value Ref Range Status   SARS Coronavirus 2 NEGATIVE NEGATIVE Final    Comment: (NOTE) If result is NEGATIVE SARS-CoV-2 target nucleic acids are NOT DETECTED. The SARS-CoV-2 RNA is generally detectable in upper and lower  respiratory specimens during the acute phase of infection. The lowest  concentration of SARS-CoV-2 viral copies this assay can detect is 250  copies / mL. A negative result does not preclude SARS-CoV-2 infection  and should not be used as  the sole basis for treatment or other  patient management decisions.  A negative result may occur with  improper specimen collection / handling, submission of specimen other  than nasopharyngeal swab, presence of viral mutation(s) within the  areas targeted by this assay, and inadequate number of viral copies  (<250 copies / mL). A negative result must be combined with clinical  observations, patient history, and epidemiological information. If result is POSITIVE SARS-CoV-2 target nucleic acids are DETECTED. The SARS-CoV-2 RNA is generally detectable in upper and lower  respiratory specimens dur ing the acute phase of infection.  Positive  results are indicative of active infection with SARS-CoV-2.  Clinical  correlation with patient history and other diagnostic information is  necessary to determine patient infection status.  Positive results do  not rule out bacterial infection or co-infection with other viruses. If result is PRESUMPTIVE POSTIVE SARS-CoV-2 nucleic acids MAY BE PRESENT.   A presumptive positive result was obtained on the submitted specimen  and confirmed on repeat testing.  While 2019 novel coronavirus  (  SARS-CoV-2) nucleic acids may be present in the submitted sample  additional confirmatory testing may be necessary for epidemiological  and / or clinical management purposes  to differentiate between  SARS-CoV-2 and other Sarbecovirus currently known to infect humans.  If clinically indicated additional testing with an alternate test  methodology [phone]) is advised. The SARS-CoV-2 RNA is generally  detectable in upper and lower respiratory sp ecimens during the acute  phase of infection. The expected result is Negative. Fact Sheet for Patients:  StrictlyIdeas.no Fact Sheet for Healthcare Providers: BankingDealers.co.za This test is not yet approved or cleared by the [place] FDA and has been authorized for  detection and/or diagnosis of SARS-CoV-2 by FDA under an Emergency Use Authorization (EUA).  This EUA will remain in effect (meaning this test can be used) for the duration of the COVID-19 declaration under Section 564(b)(1) of the Act, 21 U.S.C. section 360bbb-3(b)(1), unless the authorization is terminated or revoked sooner. Performed at [facility], [place] [address] [place], [place] [address]   Surgical pcr screen     Status: None   Collection Time: 08/30/18 11:31 AM  Result Value Ref Range Status   MRSA, PCR NEGATIVE NEGATIVE Final   Staphylococcus aureus NEGATIVE NEGATIVE Final    Comment: (NOTE) The Xpert SA Assay (FDA approved for NASAL specimens in patients 38 years of age and older), is one component of a comprehensive surveillance program. It is not intended to diagnose infection nor to guide or monitor treatment. Performed at [facility], [place] [address] [place], [place] [address]          Radiology Studies: No results found.      Scheduled Meds: . acetaminophen      . [MAR Hold] amLODipine  5 mg Oral QPM  . [MAR Hold] atorvastatin  40 mg Oral q1800  . [MAR Hold] carvedilol  6.25 mg Oral BID WC  . gabapentin      . [MAR Hold] Glycerin (Adult)  1 suppository Rectal Daily  . [MAR Hold] hydrocortisone  10 mg Oral Daily  . [MAR Hold] hydrocortisone  5 mg Oral QPM  . [MAR Hold] levothyroxine  50 mcg Oral QAC breakfast  . [MAR Hold] pantoprazole  40 mg Oral Daily  . [MAR Hold] polyethylene glycol  17 g Oral BID  . [MAR Hold] senna  1 tablet Oral QHS   Continuous Infusions: . ceFAZolin    . [MAR Hold] ciprofloxacin 400 mg (09/01/18 2213)  . dextrose 5 % and 0.9% NaCl 75 mL/hr at 09/01/18 1828  . [MAR Hold] metronidazole 500 mg (09/02/18 0432)     LOS: 4 days    Time spent: 35 minutes.     Domenic Polite, MD [facility]  09/02/2018, 10:58 AM

## 2018-09-02 NOTE — Anesthesia Preprocedure Evaluation (Signed)
Anesthesia Evaluation  Patient identified by MRN, date of birth, ID band Patient awake    Reviewed: Allergy & Precautions, NPO status , Patient's Chart, lab work & pertinent test results  Airway Mallampati: II  TM Distance: >3 FB     Dental  (+) Edentulous Upper, Dental Advisory Given   Pulmonary former smoker,    breath sounds clear to auscultation       Cardiovascular hypertension,  Rhythm:Regular Rate:Normal     Neuro/Psych    GI/Hepatic   Endo/Other  diabetes  Renal/GU      Musculoskeletal   Abdominal   Peds  Hematology   Anesthesia Other Findings   Reproductive/Obstetrics                             Anesthesia Physical Anesthesia Plan  ASA: III  Anesthesia Plan: General   Post-op Pain Management:    Induction: Intravenous  PONV Risk Score and Plan: Ondansetron and Dexamethasone  Airway Management Planned: Oral ETT  Additional Equipment:   Intra-op Plan:   Post-operative Plan: Extubation in OR  Informed Consent: I have reviewed the patients History and Physical, chart, labs and discussed the procedure including the risks, benefits and alternatives for the proposed anesthesia with the patient or authorized representative who has indicated his/her understanding and acceptance.     Dental advisory given  Plan Discussed with: CRNA and Anesthesiologist  Anesthesia Plan Comments:         Anesthesia Quick Evaluation

## 2018-09-02 NOTE — Op Note (Signed)
Laparoscopic Cholecystectomy and repair of umbilical hernia  Indications: This patient presents with acute on chronic cholecystitis and will undergo laparoscopic cholecystectomy. 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  Pt has metastatic renal cell cancer under treatment that has been stable for quite some time.    Pre-operative Diagnosis: acute on chronic cholecystitis, umbilical hernia (at incision)  Post-operative Diagnosis: Same  Surgeon: Stark Klein   Assistants: Annye English MD  Anesthesia: General endotracheal anesthesia and local  ASA Class: 3  Procedure Details  The patient was seen again in the Holding Room. The risks, benefits, complications, treatment options, and expected outcomes were discussed with the patient. The possibilities of  bleeding, recurrent infection, damage to nearby structures, the need for additional procedures, failure to diagnose a condition, the possible need to convert to an open procedure, and creating a complication requiring transfusion or operation were discussed with the patient. The likelihood of improving the patient's symptoms with return to their baseline status is good.    The patient and/or family concurred with the proposed plan, giving informed consent. The site of surgery properly noted. The patient was taken to Operating Room, and the procedure verified as Laparoscopic Cholecystectomy.  A Time Out was held and the above information confirmed.  Prior to the induction of general anesthesia, antibiotic prophylaxis was administered. General endotracheal anesthesia was then administered and tolerated well. After the induction, the abdomen was prepped with Chloraprep and draped in the sterile fashion. The patient was positioned in the supine position.  Local anesthetic agent was injected into the skin near the umbilicus over the hernia and an incision made. We dissected down to the abdominal fascia with blunt  dissection.  The hernia sac was entered.  It was around 1.5-2 cm.  Small bowel was seen and avoided.  A pursestring suture of 0-Vicryl was placed around the fascial opening.  There was still a bit of a defect and an additional simple 0-0 vicryl was placed.  The Hasson cannula was inserted and secured with the stay suture.  Pneumoperitoneum was then created with CO2 and tolerated well without any adverse changes in the patient's vital signs. An 11-mm port was placed in the subxiphoid position.  Two 5-mm ports were placed in the right upper quadrant. All skin incisions were infiltrated with a local anesthetic agent before making the incision and placing the trocars.   We positioned the patient in reverse Trendelenburg, tilted slightly to the patient's left.  The gallbladder was identified, the fundus grasped and retracted cephalad. Adhesions were lysed bluntly and with the electrocautery where indicated, taking care not to injure any adjacent organs or viscus. The infundibulum was grasped and retracted laterally, exposing the peritoneum overlying the triangle of Calot. This was then divided and exposed in a blunt fashion. The cystic duct was clearly identified.  It was a bit enlarged due to the biliary obstruction.  The cystic duct was bluntly dissected circumferentially.  The cystic artery was seen much higher up on the gallbladder.   The cystic duct was ligated with a clip distally and three clips proximally.  The cystic artery was identified, dissected free, ligated with clips and divided as well.   The gallbladder was dissected from the liver bed in retrograde fashion with the electrocautery. The gallbladder was removed and placed in an Ecosac.  The gallbladder and Ecosac were then removed through the umbilical port site.  The liver bed was irrigated and inspected. Hemostasis was achieved with the electrocautery.  Copious irrigation was utilized and was repeatedly aspirated until clear.    We again inspected  the right upper quadrant for hemostasis.  Pneumoperitoneum was released as we removed the trocars.   The pursestring suture was used to close the umbilical fascia. There was still a bit of a defect at the prior hernia site.  An additional simple 0-0 vicryl was placed.   4-0 Monocryl was used to close the skin.   The skin was cleaned and dry, and Dermabond was applied. The patient was then extubated and brought to the recovery room in stable condition. Instrument, sponge, and needle counts were correct at closure and at the conclusion of the case.   Findings: Chronic inflammation of gallbladder.  Dilated cystic duct.  No evidence of carcinomatosis from renal cell cancer.    Some fatty changes of liver  Estimated Blood Loss: min         Drains: none          Specimens: Gallbladder to pathology       Complications: None; patient tolerated the procedure well.         Disposition: PACU - hemodynamically stable.         Condition: stable

## 2018-09-02 NOTE — Progress Notes (Signed)
1050 Pt to Short stay. Scheduled for lap chole today. NPO post mn maint. Denies pain. Report was given to [facility] at [place] stay.

## 2018-09-02 NOTE — Transfer of Care (Signed)
Immediate Anesthesia Transfer of Care Note  Patient: Audrey [name]  Procedure(s) Performed: LAPAROSCOPIC CHOLECYSTECTOMY (N/A Abdomen)  Patient Location: PACU  Anesthesia Type:General  Level of Consciousness: awake and patient cooperative  Airway & Oxygen Therapy: Patient Spontanous Breathing and Patient connected to nasal cannula oxygen  Post-op Assessment: Report given to RN, Post -op Vital signs reviewed and stable and Patient moving all extremities  Post vital signs: Reviewed and stable  Last Vitals:  Vitals Value Taken Time  BP 153/59 09/02/2018  1:15 PM  Temp    Pulse 57 09/02/2018  1:16 PM  Resp 13 09/02/2018  1:16 PM  SpO2 100 % 09/02/2018  1:16 PM  Vitals shown include unvalidated device data.  Last Pain:  Vitals:   09/02/18 1113  TempSrc:   PainSc: 0-No pain         Complications: No apparent anesthesia complications

## 2018-09-03 ENCOUNTER — Encounter (HOSPITAL_COMMUNITY): Payer: Self-pay | Admitting: General Surgery

## 2018-09-03 LAB — COMPREHENSIVE METABOLIC PANEL
ALT: 22 U/L (ref 0–44)
AST: 27 U/L (ref 15–41)
Albumin: 2.8 g/dL — ABNORMAL LOW (ref 3.5–5.0)
Alkaline Phosphatase: 42 U/L (ref 38–126)
Anion gap: 6 (ref 5–15)
BUN: 11 mg/dL (ref 8–23)
CO2: 22 mmol/L (ref 22–32)
Calcium: 7.8 mg/dL — ABNORMAL LOW (ref 8.9–10.3)
Chloride: 106 mmol/L (ref 98–111)
Creatinine, Ser: 1.33 mg/dL — ABNORMAL HIGH (ref 0.44–1.00)
GFR calc Af Amer: 45 mL/min — ABNORMAL LOW (ref >60)
GFR calc non Af Amer: 39 mL/min — ABNORMAL LOW (ref >60)
Glucose, Bld: 255 mg/dL — ABNORMAL HIGH (ref 70–99)
Potassium: 4.7 mmol/L (ref 3.5–5.1)
Sodium: 134 mmol/L — ABNORMAL LOW (ref 135–145)
Total Bilirubin: 0.5 mg/dL (ref 0.3–1.2)
Total Protein: 4.9 g/dL — ABNORMAL LOW (ref 6.5–8.1)

## 2018-09-03 LAB — CBC
HCT: 31.1 % — ABNORMAL LOW (ref 36.0–46.0)
Hemoglobin: 9.8 g/dL — ABNORMAL LOW (ref 12.0–15.0)
MCH: 29.9 pg (ref 26.0–34.0)
MCHC: 31.5 g/dL (ref 30.0–36.0)
MCV: 94.8 fL (ref 80.0–100.0)
Platelets: 208 10*3/uL (ref 150–400)
RBC: 3.28 MIL/uL — ABNORMAL LOW (ref 3.87–5.11)
RDW: 13.6 % (ref 11.5–15.5)
WBC: 7.5 10*3/uL (ref 4.0–10.5)
nRBC: 0 % (ref 0.0–0.2)

## 2018-09-03 MED ORDER — ACETAMINOPHEN 325 MG PO TABS: 650.0000 mg | ORAL_TABLET | Freq: Four times a day (QID) | ORAL | Status: DC | PRN

## 2018-09-03 MED ORDER — METRONIDAZOLE IN NACL 5-0.79 MG/ML-% IV SOLN
500.0000 mg | Freq: Three times a day (TID) | INTRAVENOUS | Status: AC
  Administered 2018-09-03: 500 mg via INTRAVENOUS
  Filled 2018-09-03: qty 100

## 2018-09-03 MED ORDER — HYDROMORPHONE HCL 1 MG/ML IJ SOLN: 0.5000 mg | INTRAMUSCULAR | Status: DC | PRN

## 2018-09-03 MED ORDER — OXYCODONE HCL 5 MG PO TABS
5.0000 mg | ORAL_TABLET | Freq: Four times a day (QID) | ORAL | Status: DC | PRN
  Administered 2018-09-03: 5 mg via ORAL
  Filled 2018-09-03: qty 1

## 2018-09-03 MED ORDER — CIPROFLOXACIN IN D5W 400 MG/200ML IV SOLN
400.0000 mg | INTRAVENOUS | Status: AC
  Administered 2018-09-03: 400 mg via INTRAVENOUS
  Filled 2018-09-03: qty 200

## 2018-09-03 NOTE — Progress Notes (Signed)
[facility] Progress Note  1 Day Post-Op  Subjective: CC-  Feeling well this morning. Abdomen sore but pain improved. Tolerating clear liquids. Denies n/v. BM this morning.  Objective: Vital signs in last 24 hours: Temp:  [97 F (36.1 C)-100 F (37.8 C)] 98.3 F (36.8 C) (05/14 0448) Pulse Rate:  [51-63] 55 (05/14 0448) Resp:  [12-16] 14 (05/14 0448) BP: (130-153)/(52-67) 134/58 (05/14 0448) SpO2:  [95 %-100 %] 100 % (05/14 0448) Weight:  [60.5 kg] 60.5 kg (05/13 1113) Last BM Date: 09/03/18  Intake/Output from previous day: 05/13 0701 - 05/14 0700 In: 790 [P.O.:240; I.V.:500] Out: 30 [Blood:30] Intake/Output this shift: Total I/O In: 720 [P.O.:720] Out: -   PE: Gen: Alert, NAD HEENT: EOM's intact, pupils equal and round Pulm: effort normal Abd: Soft,mild distension, mild tenderness around umbilical incision,+BS, incisions cdi without erythema or drainage Skin: warm and dry   Lab Results:  Recent Labs    09/02/18 0349 09/03/18 0236  WBC 4.7 7.5  HGB 10.0* 9.8*  HCT 32.1* 31.1*  PLT 187 208   BMET Recent Labs    09/02/18 0349 09/03/18 0236  NA 139 134*  K 4.1 4.7  CL 111 106  CO2 23 22  GLUCOSE 191* 255*  BUN 9 11  CREATININE 1.45* 1.33*  CALCIUM 8.0* 7.8*   PT/INR Recent Labs    09/01/18 1850  LABPROT 13.7  INR 1.1   CMP     Component Value Date/Time   NA 134 (L) 09/03/2018 0236   NA 140 04/04/2017 0830   K 4.7 09/03/2018 0236   K 4.1 04/04/2017 0830   CL 106 09/03/2018 0236   CO2 22 09/03/2018 0236   CO2 21 (L) 04/04/2017 0830   GLUCOSE 255 (H) 09/03/2018 0236   GLUCOSE 107 04/04/2017 0830   BUN 11 09/03/2018 0236   BUN 37.0 (H) 04/04/2017 0830   CREATININE 1.33 (H) 09/03/2018 0236   CREATININE 1.75 (H) 08/28/2018 1017   CREATININE 2.2 (H) 04/04/2017 0830   CALCIUM 7.8 (L) 09/03/2018 0236   CALCIUM 10.1 04/04/2017 0830   PROT 4.9 (L) 09/03/2018 0236   PROT 7.5 04/04/2017 0830   ALBUMIN 2.8 (L) 09/03/2018 0236    ALBUMIN 4.0 04/04/2017 0830   AST 27 09/03/2018 0236   AST 18 08/28/2018 1017   AST 19 04/04/2017 0830   ALT 22 09/03/2018 0236   ALT 52 (H) 08/28/2018 1017   ALT 12 04/04/2017 0830   ALKPHOS 42 09/03/2018 0236   ALKPHOS 65 04/04/2017 0830   BILITOT 0.5 09/03/2018 0236   BILITOT 0.4 08/28/2018 1017   BILITOT 0.34 04/04/2017 0830   GFRNONAA 39 (L) 09/03/2018 0236   GFRNONAA 28 (L) 08/28/2018 1017   GFRAA 45 (L) 09/03/2018 0236   GFRAA 32 (L) 08/28/2018 1017   Lipase     Component Value Date/Time   LIPASE 31 08/29/2018 1430       Studies/Results: No results found.  Anti-infectives: Anti-infectives (From admission, onward)   Start     Dose/Rate Route Frequency Ordered Stop   09/02/18 1048  ceFAZolin (ANCEF) 2-4 GM/100ML-% IVPB    Note to Pharmacy:  Marga Melnick   : cabinet override      09/02/18 1048 09/02/18 2259   08/29/18 2200  ciprofloxacin (CIPRO) IVPB 400 mg     400 mg 200 mL/hr over 60 Minutes Intravenous Every 24 hours 08/29/18 1955     08/29/18 2000  metroNIDAZOLE (FLAGYL) IVPB 500 mg     500  mg 100 mL/hr over 60 Minutes Intravenous Every 8 hours 08/29/18 1955         Assessment/Plan Metastatic renal cell carcinoma stable-just completed chemotherapy H/o vaginal and rectal prolapse HTN HLD Hypothyroidism GERD Chronic constipation  Acute on chronic cholecystitis History choledocholithiasis status post ERCP x3 S/p laparoscopic cholecystectomy 5/13 Dr. Barry Dienes - most recent ERCP 5/7 with 3 bile duct brushings, 1 collected suspicious for malignancy - POD #1 from lap chole - Advance to [facility] diet. Mobilize. Continue aggressive bowel regimen due to chronic constipation. Patient will likely be ready for discharge tomorrow from surgical standpoint. Discharge instructions and follow up info on AVS.  ID -cipro/flagyl 5/9>> FEN -HH diet VTE -SCDs, sq heparin Foley -none Follow up - Dr. Barry Dienes Contact: son Elberta Fortis [phone]), daughter Daleen Snook  [phone])    LOS: 5 days    Wellington Hampshire , [facility] 09/03/2018, 9:01 AM Pager: [phone] Mon-Thurs 7:00 am-4:30 pm Fri 7:00 am -11:30 AM Sat-Sun 7:00 am-11:30 am

## 2018-09-03 NOTE — Progress Notes (Signed)
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  For her metastatic RCC she underwent left nephrectomy and is on chemotherapy with Novolumab -Patient has had multiple ERCP for stent placement, she also had biliary sphincterotomy, had a stent which 1 of 2 was removed, she also had proximal dilation with cytology and multiple procedures. -She was evaluated by gastroenterology and admission was recommended for cholecystectomy, CT abdomen pelvis did not show any acute findings -Prior to admission underwent EUS with FNA on 5/7 multiple stent placement, distal CBD narrowing and upstream dilation noted duct brushings noted atypical cells suspicious for malignancy   Assessment & Plan:   1- Cholelithiasis; chronic cholecystitis with history of choledocholithiasis status post multiple ERCP x3, multiple stent placements -Last EUS with FNA 5/7: Duct brushings cytology showed atypical cells suspicious for cancer -GI and general surgery following -Status post lap chole yesterday 5/13 -remains on IV ciprofloxacin and Flagyl, will discontinue antibiotics after today's dose -Advance diet, increase activity -Home tomorrow  2-metastatic renal cell cancer  -History of left nephrectomy, followed by oncology, on immunotherapy with nivolumab  -Chemotherapy has been on hold for a month due to abnormal LFTs  -Oncology following  3. Constipation:  -History of rectal prolapse and chronic constipation  -Continue MiraLAX, Dulcolax, patient had a small bowel movement following smog enema   4-Hypertension; continue with amlodipine and Coreg.  5-Vaginal and  rectal prolapse;  -need to follow-up with her gynecologist.  Needs to follow-up with [place] [facility].  6-Anemia; monitor level.  Hb stable.   DVT prophylaxis: Heparin SQ Code Status: Full code Family Communication: Care discussed with patient Disposition Plan: Home tomorrow  Consultants:   Surgery  GI   Procedures:   None   Antimicrobials:  Ciprofloxacin and Flagyl 08/29/2018  Subjective: -Feels okay, anxious to be done with cholecystectomy  Objective: Vitals:   09/02/18 1826 09/02/18 1933 09/03/18 0020 09/03/18 0448  BP: (!) 142/62 (!) 136/54 135/67 (!) 134/58  Pulse: 61 60 63 (!) 55  Resp: 16 14 14 14   Temp: 99 F (37.2 C) 100 F (37.8 C) 98.6 F (37 C) 98.3 F (36.8 C)  TempSrc: Oral Oral Oral Oral  SpO2: 97% 99% 99% 100%  Weight:      Height:        Intake/Output Summary (Last 24 hours) at 09/03/2018 1339 Last data filed at 09/03/2018 4970 Gross per 24 hour  Intake 1010 ml  Output --  Net 1010 ml   Filed Weights   08/29/18 1310 09/02/18 1113  Weight: 60.5 kg 60.5 kg    Examination:  Gen: Awake, Alert, Oriented X 3,  HEENT: PERRLA, Neck supple, no JVD Lungs: Decreased breath sounds at both bases, rest clear CVS: RRR,No Gallops,Rubs or new Murmurs Abd: Soft, mild tenderness as anticipated, bowel sounds diminished Extremities: No edema Skin: no new rashes  Data Reviewed: I have personally reviewed following labs and imaging studies  CBC: Recent Labs  Lab 08/28/18 1017 08/29/18 1430 08/30/18 0209 08/31/18 0205 09/02/18 0349 09/03/18 0236  WBC 9.0 7.9 6.2 4.8 4.7 7.5  NEUTROABS 6.8 5.7  --   --   --   --   HGB 11.1* 11.6* 10.3* 10.4* 10.0* 9.8*  HCT 34.6* 36.7 32.7* 33.0* 32.1* 31.1*  MCV 93.5 95.3 94.5 94.6 96.1 94.8  PLT 232 214 193 186 187 284   Basic Metabolic Panel: Recent Labs  Lab 08/29/18 1430 08/30/18 0209 08/31/18 0205 09/02/18 0349 09/03/18 0236  NA 137 138 138 139 134*  K 4.6 4.7 4.2 4.1 4.7  CL 107 106 107 111 106   CO2 22 24 22 23 22   GLUCOSE 155* 197* 178* 191* 255*  BUN 21 17 12 9 11   CREATININE 1.66* 1.67* 1.50* 1.45* 1.33*  CALCIUM 8.9 8.4* 8.2* 8.0* 7.8*   GFR: Estimated Creatinine Clearance: 28.5 mL/min (A) (by C-G formula based on SCr of 1.33 mg/dL (H)). Liver Function Tests: Recent Labs  Lab 08/28/18 1017 08/29/18 1430 08/30/18 0209 09/02/18 0349 09/03/18 0236  AST 18 28 21 24 27   ALT 52* 43 33 23 22  ALKPHOS 85 67 58 53 42  BILITOT 0.4 0.9 0.6 0.3 0.5  PROT 6.6 6.0* 5.1* 5.0* 4.9*  ALBUMIN 3.7 3.6 3.0* 2.6* 2.8*   Recent Labs  Lab 08/29/18 1430  LIPASE 31   No results for input(s): AMMONIA in the last 168 hours. Coagulation Profile: Recent Labs  Lab 09/01/18 1850  INR 1.1   Cardiac Enzymes: No results for input(s): CKTOTAL, CKMB, CKMBINDEX, TROPONINI in the last 168 hours. BNP (last 3 results) No results for input(s): PROBNP in the last 8760 hours. HbA1C: No results for input(s): HGBA1C in the last 72 hours. CBG: Recent Labs  Lab 08/27/18 1557 08/27/18 1647 09/02/18 1106 09/02/18 1318 09/02/18 1433  GLUCAP 333* 275* 185* 223* 191*   Lipid Profile: No results for input(s): CHOL, HDL, LDLCALC, TRIG, CHOLHDL, LDLDIRECT in the last 72 hours. Thyroid Function Tests: No results for input(s): TSH, T4TOTAL, FREET4, T3FREE, THYROIDAB in the last 72 hours. Anemia Panel: No results for input(s): VITAMINB12, FOLATE, FERRITIN, TIBC, IRON, RETICCTPCT in the last 72 hours. Sepsis Labs: No results for input(s): PROCALCITON, LATICACIDVEN in the last 168 hours.  Recent Results (from the past 240 hour(s))  SARS Coronavirus 2 (CEPHEID - Performed in [facility] lab), Hosp Order     Status: None   Collection Time: 08/29/18  7:15 PM  Result Value Ref Range Status   SARS Coronavirus 2 NEGATIVE NEGATIVE Final    Comment: (NOTE) If result is NEGATIVE SARS-CoV-2 target nucleic acids are NOT DETECTED. The SARS-CoV-2 RNA is generally detectable in upper and lower   respiratory specimens during the acute phase of infection. The lowest  concentration of SARS-CoV-2 viral copies this assay can detect is 250  copies / mL. A negative result does not preclude SARS-CoV-2 infection  and should not be used as the sole basis for treatment or other  patient management decisions.  A negative result may occur with  improper specimen collection / handling, submission of specimen other  than nasopharyngeal swab, presence of viral mutation(s) within the  areas targeted by this assay, and inadequate number of viral copies  (<250 copies / mL). A negative result must be combined with clinical  observations, patient history, and epidemiological information. If result is POSITIVE SARS-CoV-2 target nucleic acids are DETECTED. The SARS-CoV-2 RNA is generally detectable in upper and lower  respiratory specimens dur ing the acute phase of infection.  Positive  results are indicative of active infection with SARS-CoV-2.  Clinical  correlation with patient history and other diagnostic information is  necessary to determine patient infection status.  Positive results do  not rule out bacterial infection  or co-infection with other viruses. If result is PRESUMPTIVE POSTIVE SARS-CoV-2 nucleic acids MAY BE PRESENT.   A presumptive positive result was obtained on the submitted specimen  and confirmed on repeat testing.  While 2019 novel coronavirus  (SARS-CoV-2) nucleic acids may be present in the submitted sample  additional confirmatory testing may be necessary for epidemiological  and / or clinical management purposes  to differentiate between  SARS-CoV-2 and other Sarbecovirus currently known to infect humans.  If clinically indicated additional testing with an alternate test  methodology [phone]) is advised. The SARS-CoV-2 RNA is generally  detectable in upper and lower respiratory sp ecimens during the acute  phase of infection. The expected result is Negative. Fact  Sheet for Patients:  StrictlyIdeas.no Fact Sheet for Healthcare Providers: BankingDealers.co.za This test is not yet approved or cleared by the [place] FDA and has been authorized for detection and/or diagnosis of SARS-CoV-2 by FDA under an Emergency Use Authorization (EUA).  This EUA will remain in effect (meaning this test can be used) for the duration of the COVID-19 declaration under Section 564(b)(1) of the Act, 21 U.S.C. section 360bbb-3(b)(1), unless the authorization is terminated or revoked sooner. Performed at [facility], [place] [address] [place], [place] [address]   Surgical pcr screen     Status: None   Collection Time: 08/30/18 11:31 AM  Result Value Ref Range Status   MRSA, PCR NEGATIVE NEGATIVE Final   Staphylococcus aureus NEGATIVE NEGATIVE Final    Comment: (NOTE) The Xpert SA Assay (FDA approved for NASAL specimens in patients 58 years of age and older), is one component of a comprehensive surveillance program. It is not intended to diagnose infection nor to guide or monitor treatment. Performed at [facility], [place] [address], [place], [place] [address]          Radiology Studies: No results found.      Scheduled Meds:  amLODipine  5 mg Oral QPM   atorvastatin  40 mg Oral q1800   carvedilol  6.25 mg Oral BID WC   Glycerin (Adult)  1 suppository Rectal Daily   heparin injection (subcutaneous)  5,000 Units Subcutaneous Q8H   hydrocortisone  10 mg Oral Daily   hydrocortisone  5 mg Oral QPM   levothyroxine  50 mcg Oral QAC breakfast   pantoprazole  40 mg Oral Daily   polyethylene glycol  17 g Oral BID   senna  1 tablet Oral QHS   Continuous Infusions:  ciprofloxacin 400 mg (09/02/18 2202)   metronidazole 500 mg (09/03/18 1146)     LOS: 5 days    Time spent: 25 minutes.     Domenic Polite, MD [facility]  09/03/2018, 1:39 PM

## 2018-09-03 NOTE — Progress Notes (Signed)
Inpatient Diabetes Program Recommendations  AACE/ADA: New Consensus Statement on Inpatient Glycemic Control (2015)  Target Ranges:  Prepandial:   less than 140 mg/dL      Peak postprandial:   less than 180 mg/dL (1-2 hours)      Critically ill patients:  140 - 180 mg/dL   Lab Results  Component Value Date   GLUCAP 191 (H) 09/02/2018   HGBA1C 6.6 (H) 07/29/2016    Review of Glycemic Control  Diabetes history: DM2 Outpatient Diabetes medications: Prandin 0.5 mg bid ac meals Current orders for Inpatient glycemic control: None  Inpatient Diabetes Program Recommendations:   While in the hospital, -Glycemic control order set to restart with sensitive correction tid  Thank you, Nani Gasser. Wilford Merryfield, RN, MSN, CDE  Diabetes Coordinator Inpatient Glycemic Control Team Team Pager [phone] (8am-5pm) 09/03/2018 4:10 PM

## 2018-09-04 ENCOUNTER — Telehealth: Payer: Self-pay

## 2018-09-04 DIAGNOSIS — K811 Chronic cholecystitis: Secondary | ICD-10-CM | POA: Diagnosis present

## 2018-09-04 DIAGNOSIS — Z9889 Other specified postprocedural states: Secondary | ICD-10-CM

## 2018-09-04 MED ORDER — HEPARIN SOD (PORK) LOCK FLUSH 100 UNIT/ML IV SOLN
500.0000 [IU] | INTRAVENOUS | Status: AC | PRN
  Administered 2018-09-04: 500 [IU]

## 2018-09-04 MED ORDER — ALTEPLASE 2 MG IJ SOLR
2.0000 mg | Freq: Once | INTRAMUSCULAR | Status: AC
  Administered 2018-09-04: 2 mg
  Filled 2018-09-04: qty 2

## 2018-09-04 MED ORDER — ACETAMINOPHEN 325 MG PO TABS: 650.0000 mg | ORAL_TABLET | Freq: Four times a day (QID) | ORAL | Status: DC | PRN

## 2018-09-04 MED ORDER — OXYCODONE HCL 5 MG PO TABS: 5.0000 mg | ORAL_TABLET | Freq: Four times a day (QID) | ORAL | 0 refills | Status: DC | PRN

## 2018-09-04 MED ORDER — POLYETHYLENE GLYCOL 3350 17 G PO PACK: 17.0000 g | PACK | Freq: Every day | ORAL | 0 refills | Status: DC | PRN

## 2018-09-04 NOTE — Discharge Summary (Signed)
Physician Discharge Summary  Audrey [name] TMH:[id number] DOB: [birth year] DOA: 08/29/2018  PCP: [name], MD  Admit date: 08/29/2018 Discharge date: 09/04/2018  Time spent: 35 minutes  Recommendations for Outpatient Follow-up:  General surgery Dr. Barry Dienes on 6/1 for postop follow-up Oncology 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  For her metastatic RCC she underwent left nephrectomy and is on chemotherapy with Novolumab -Patient has had multiple ERCP for stent placement, she also had biliary sphincterotomy, had a stent which 1 of 2 was removed, she also had proximal dilation with cytology and multiple procedures. -She was evaluated by gastroenterology and admission was recommended for cholecystectomy, CT abdomen pelvis did not show any acute findings  Hospital Course:  1- Cholelithiasis; chronic cholecystitis with history of choledocholithiasis status post multiple ERCP x3, multiple stent placements -Last EUS with FNA 5/7: Duct brushings cytology showed atypical cells suspicious for cancer -GI and general surgery consulted -Status post lap chole yesterday 5/13 -Treated with IV ciprofloxacin and Flagyl,  discontinued antibiotics yesterday following lap chole -Clinically improved, tolerating diet at the time of discharge -Please follow-up biopsy, CBD -Postop follow-up with Dr. Barry Dienes on 6/1  2-metastatic renal cell cancer  -History of left nephrectomy, followed by oncology, on immunotherapy with nivolumab  -Chemotherapy has been on hold for a month due to abnormal LFTs  -Follow-up with oncology to decide timing of resumption of chemotherapy  3. Constipation:  -History of rectal prolapse and chronic constipation  -Continue MiraLAX, Dulcolax, patient bowel movement yesterday and this morning  4-Hypertension; continue with amlodipine and Coreg.  5-Vaginal and rectal prolapse;  -need to follow-up with her gynecologist.  Needs to follow-up with [facility].  6-Anemia; monitor level.  Hb stable.    Consultants:   Surgery  GI   Procedures:  Laparoscopic cholecystectomy 5/13    Discharge Exam: Vitals:   09/04/18 0456 09/04/18 1449  BP: (!) 156/59 134/68  Pulse: (!) 56 73  Resp: 18 18  Temp: 98.1 F (36.7 C) 98.4 F (36.9 C)  SpO2: 99% 99%    General: AAOx3 Cardiovascular: S1S2/RRR Respiratory: CTAB  Discharge Instructions   Discharge Instructions    Diet Carb Modified   Complete by:  As directed    Increase activity slowly   Complete by:  As directed      Allergies as of 09/04/2018      Reactions   Adhesive [tape] Other (See Comments)   Tears skin off Paper tape is ok per patient   Nsaids Other (See Comments)   Liver damage   Statins Other (See Comments)   Liver/kidney issues.      Medication List    STOP taking these medications   ciprofloxacin 500 MG tablet Commonly known as:  Cipro   senna-docusate 8.6-50 MG tablet Commonly known  as:  Senna S   traMADol 50 MG tablet Commonly known as:  ULTRAM   Xgeva 120 MG/1.7ML Soln injection Generic drug:  denosumab     TAKE these medications   acetaminophen 325 MG tablet Commonly known as:   TYLENOL Take 2 tablets (650 mg total) by mouth every 6 (six) hours as needed for mild pain. What changed:    medication strength  how much to take  when to take this  reasons to take this   amLODipine 5 MG tablet Commonly known as:  NORVASC Take 5 mg by mouth daily.   atorvastatin 40 MG tablet Commonly known as:  LIPITOR Take 40 mg by mouth at bedtime.   bisacodyl 5 MG EC tablet Commonly known as:  DULCOLAX Take 5 mg by mouth at bedtime.   Calcium 600-200 MG-UNIT tablet Take 1 tablet by mouth daily after breakfast.   carvedilol 6.25 MG tablet Commonly known as:  COREG Take 6.25 mg by mouth 2 (two) times daily.   clobetasol ointment 0.05 % Commonly known as:  TEMOVATE Apply 1 application topically 2 (two) times daily as needed (lichen sclerosis).   docusate sodium 100 MG capsule Commonly known as:  COLACE Take 100 mg by mouth at bedtime.   hydrocortisone 10 MG tablet Commonly known as:  CORTEF Take 5-10 mg by mouth See admin instructions. Take 1 tablet (10 mg) by mouth in the morning & 1/2 tablet (5 mg) by mouth in the evening.   levothyroxine 50 MCG tablet Commonly known as:  SYNTHROID Take 50 mcg by mouth daily before breakfast.   lidocaine-prilocaine cream Commonly known as:  EMLA Apply to affected area once What changed:    how much to take  how to take this  when to take this  reasons to take this  additional instructions   omeprazole 20 MG capsule Commonly known as:  PriLOSEC Take 1 capsule (20 mg total) by mouth 2 (two) times daily.   Audrey [name] IV Inject 1 Dose into the vein every 14 (fourteen) days. Audrey [name]   oxyCODONE 5 MG immediate release tablet Commonly known as:  Oxy IR/ROXICODONE Take 1 tablet (5 mg total) by mouth every 6 (six) hours as needed for severe pain.   polyethylene glycol 17 g packet Commonly known as:  MIRALAX / GLYCOLAX Take 17 g by mouth daily as needed.   repaglinide 0.5 MG tablet Commonly known as:   PRANDIN Take 0.5 mg by mouth 2 (two) times daily before a meal.      Allergies  Allergen Reactions  . Adhesive [Tape] Other (See Comments)    Tears skin off Paper tape is ok per patient  . Nsaids Other (See Comments)    Liver damage  . Statins Other (See Comments)    Liver/kidney issues.   Follow-up Information    Stark Klein, MD. Call on 09/21/2018.   Specialty:  General Surgery Why:  6/1 at 9:15am. Due to coronavirus we are decreasing foot traffic in office. Instead of coming to an appt a provider will call you at the above date/time. Send picture of your incision with your name and date of birth to [email or web address] Contact information: [address] [place] [address] [phone]        [name], MD. Schedule an appointment as soon as possible for a visit in 1 week(s).   Specialty:  Internal Medicine Contact information: [address] [place] [address] [phone]  The results of significant diagnostics from this hospitalization (including imaging, microbiology, ancillary and laboratory) are listed below for reference.    Significant Diagnostic Studies: Ct Abdomen Pelvis Wo Contrast  Result Date: 08/29/2018 CLINICAL DATA:  Generalized abdominal pain EXAM: CT ABDOMEN AND PELVIS WITHOUT CONTRAST TECHNIQUE: Multidetector CT imaging of the abdomen and pelvis was performed following the standard protocol without IV contrast. COMPARISON:  PET-CT, 04/03/2018 FINDINGS: Lower chest: No acute abnormality.  Coronary artery calcifications. Hepatobiliary: No focal liver abnormality is seen. Status post biliary duct stenting with biliary ductal dilatation and pneumobilia. Gallstones in the gallbladder. Pancreas: Unremarkable. No pancreatic ductal dilatation or surrounding inflammatory changes. Spleen: Normal in size without focal abnormality. Adrenals/Urinary Tract: Adrenal glands are unremarkable. Status post left  nephrectomy. Bladder is unremarkable. Stomach/Bowel: Stomach is within normal limits. Appendix appears normal. No evidence of bowel wall thickening, distention, or inflammatory changes. Large burden of dense stool in the colon. Pancolonic diverticulosis. Very redundant sigmoid colon. Vascular/Lymphatic: Calcific atherosclerosis. No enlarged abdominal or pelvic lymph nodes. Reproductive: No mass or other abnormality. Other: No abdominal wall hernia or abnormality. No abdominopelvic ascites. Musculoskeletal: No acute or significant osseous findings. IMPRESSION: 1. No definite acute non-contrast CT findings to explain abdominal pain. 2. Status post biliary ductal stenting with biliary ductal dilatation and pneumobilia. Gallstones in the gallbladder. 3. Large burden of dense stool in the colon. Pancolonic diverticulosis. Very redundant sigmoid colon. 4.  Status post left nephrectomy. Electronically Signed   By: Eddie Candle M.D.   On: 08/29/2018 16:04   Dg Chest 2 View  Result Date: 08/30/2018 CLINICAL DATA:  Preoperative chest radiograph prior to cholecystectomy. EXAM: CHEST - 2 VIEW COMPARISON:  05/05/2018 FINDINGS: UPPER limits normal heart size and RIGHT IJ Port-A-Cath with tip overlying the mid SVC again noted. There is no evidence of focal airspace disease, pulmonary edema, suspicious pulmonary nodule/mass, pleural effusion, or pneumothorax. No acute bony abnormalities are identified. IMPRESSION: UPPER limits normal heart size without evidence of acute cardiopulmonary disease. Electronically Signed   By: Margarette Canada M.D.   On: 08/30/2018 11:41   Dg Abd 1 View  Result Date: 08/19/2018 CLINICAL DATA:  Generalized abdominal pain and distention. Constipation. EXAM: ABDOMEN - 1 VIEW COMPARISON:  None. FINDINGS: Nonobstructive pattern of bowel gas with gas present to the rectum. Large burden of stool and stool balls in the left and right colon. No free air in the abdomen on supine radiographs. Calcific  atherosclerosis about the abdomen. IMPRESSION: Nonobstructive pattern of bowel gas with gas present to the rectum. Large burden of stool and stool balls in the left and right colon. No free air in the abdomen on supine radiographs. Calcific atherosclerosis about the abdomen. Electronically Signed   By: Eddie Candle M.D.   On: 08/19/2018 11:40   Mr Abdomen Mrcp Wo Contrast  Result Date: 08/06/2018 CLINICAL DATA:  78 year old female with history of renal cell carcinoma status post left nephrectomy and chemotherapy. Abdominal pain for 1 month. Biliary ductal dilatation noted on recent ultrasound examination. EXAM: MRI ABDOMEN WITHOUT CONTRAST  (INCLUDING MRCP) TECHNIQUE: Multiplanar multisequence MR imaging of the abdomen was performed. Heavily T2-weighted images of the biliary and pancreatic ducts were obtained, and three-dimensional MRCP images were rendered by post processing. COMPARISON:  No priors. FINDINGS: Comment: Today's study is limited for detection and characterization of visceral and/or vascular lesions by lack of IV gadolinium. Lower chest: Small hiatal hernia. Hepatobiliary: No definite suspicious hepatic lesions are confidently identified on today's noncontrast examination. MRCP images demonstrate multiple  filling defects within the gallbladder, compatible with cholelithiasis. Gallbladder does not appear distended. Gallbladder wall does appear thickened and edematous, but there is no pericholecystic fluid noted at this time. There is mild intrahepatic biliary ductal dilatation, as well as some mild periductal increased T2 signal intensity which is most compatible with periportal edema. Common bile duct is dilated and measures up to 11 mm in the porta hepatis. In the distal common bile duct there is long segment narrowing of the distal common bile duct measuring 11 mm in length shortly before the level of the ampulla, concerning for a stricture (coronal image 9 of series 13). No definite filling  defect within the common bile duct to suggest choledocholithiasis. Pancreas: No pancreatic mass confidently identified on today's noncontrast examination. No pancreatic ductal dilatation noted on MRCP images. No pancreatic or peripancreatic fluid or inflammatory changes. Spleen:  Unremarkable. Adrenals/Urinary Tract: Status post left nephrectomy. No abnormal soft tissue mass in the nephrectomy bed to suggest locally recurrent disease. Multiple ovoid shaped T1 hypointense, T2 hyperintense lesions associated with the right renal pelvis, presumably parapelvic cysts. No right hydroureteronephrosis. Right adrenal gland is normal in appearance. Left adrenal gland is not confidently identified may be surgically absent. Stomach/Bowel: Visualized portions are unremarkable. Vascular/Lymphatic: Aortic atherosclerosis without definite aneurysm in the abdominal vasculature. No lymphadenopathy noted in the abdomen. Other: No significant volume of ascites noted in the visualized portions of the peritoneal cavity. Musculoskeletal: No aggressive appearing osseous lesions are noted in the visualized portions of the skeleton. IMPRESSION: 1. Distal common bile duct stricture measuring approximately 11 mm in length shortly before the level of the ampulla. This is associated with some mild intrahepatic and moderate extrahepatic biliary ductal dilatation, as well as some periportal edema in the hepatic parenchyma. 2. No choledocholithiasis. 3. Cholelithiasis with some gallbladder wall edema, but no frank evidence to strongly suggest an acute cholecystitis at this time. Electronically Signed   By: Vinnie Langton M.D.   On: 08/06/2018 12:02   Dg Ercp Biliary & Pancreatic Ducts  Result Date: 08/27/2018 CLINICAL DATA:  78 year old female with common bile duct dilation EXAM: ERCP TECHNIQUE: Multiple spot images obtained with the fluoroscopic device and submitted for interpretation post-procedure. FLUOROSCOPY TIME:  Fluoroscopy Time:  7  minutes COMPARISON:  MR 08/06/2018 FINDINGS: Limited intraoperative fluoroscopic spot images of ERCP. Initial image demonstrates endoscope projecting over the upper abdomen with cannulation of the ampulla and retrograde contrast. Borderline dilated extrahepatic biliary ducts. There is then deployment of a retrieval balloon. Final image demonstrates placement of 2 parallel plastic biliary stents. IMPRESSION: Limited images of ERCP demonstrates partial opacification of the extrahepatic biliary ducts, deployment of a retrieval balloon, and placement of parallel plastic biliary stents. Please refer to the dictated operative report for full details of intraoperative findings and procedure. Electronically Signed   By: Corrie Mckusick D.O.   On: 08/27/2018 15:34   Dg Ercp Biliary & Pancreatic Ducts  Result Date: 08/10/2018 CLINICAL DATA:  Biliary stricture EXAM: ERCP TECHNIQUE: Multiple spot images obtained with the fluoroscopic device and submitted for interpretation post-procedure. FLUOROSCOPY TIME:  Fluoroscopy Time:  7 minutes and 10 seconds Radiation Exposure Index (if provided by the fluoroscopic device): Number of Acquired Spot Images: 0 COMPARISON:  None. FINDINGS: Imaging demonstrates cannulation of the common bile duct and contrast filling the biliary tree. Balloon stone retrieval is documented. A temporary biliary stent has been placed across the ampulla of Vater. IMPRESSION: See above. These images were submitted for radiologic interpretation only. Please see the  procedural report for the amount of contrast and the fluoroscopy time utilized. Electronically Signed   By: Marybelle Killings M.D.   On: 08/10/2018 14:25   US Abdomen Limited Ruq  Result Date: 08/29/2018 CLINICAL DATA:  Abdominal pain EXAM: ULTRASOUND ABDOMEN LIMITED RIGHT UPPER QUADRANT COMPARISON:  CT from earlier the same. FINDINGS: Gallbladder: Gallbladder is well distended with multiple gallstones within. Wall thickness is at the upper limits of  normal. Common bile duct: Stent is noted within the common bile duct. It is true diameter is difficult to assess due to the stent placement Liver: No focal lesion identified. Within normal limits in parenchymal echogenicity. Portal vein is patent on color Doppler imaging with normal direction of blood flow towards the liver. IMPRESSION: Common bile duct stents in place. No definitive biliary ductal dilatation is seen. The changes on the prior CT likely related to incomplete opacification of portal vein. Cholelithiasis with upper limits normal wall thickening Electronically Signed   By: Inez Catalina M.D.   On: 08/29/2018 17:26    Microbiology: Recent Results (from the past 240 hour(s))  SARS Coronavirus 2 (CEPHEID - Performed in [facility] hospital lab), Hosp Order     Status: None   Collection Time: 08/29/18  7:15 PM  Result Value Ref Range Status   SARS Coronavirus 2 NEGATIVE NEGATIVE Final    Comment: (NOTE) If result is NEGATIVE SARS-CoV-2 target nucleic acids are NOT DETECTED. The SARS-CoV-2 RNA is generally detectable in upper and lower  respiratory specimens during the acute phase of infection. The lowest  concentration of SARS-CoV-2 viral copies this assay can detect is 250  copies / mL. A negative result does not preclude SARS-CoV-2 infection  and should not be used as the sole basis for treatment or other  patient management decisions.  A negative result may occur with  improper specimen collection / handling, submission of specimen other  than nasopharyngeal swab, presence of viral mutation(s) within the  areas targeted by this assay, and inadequate number of viral copies  (<250 copies / mL). A negative result must be combined with clinical  observations, patient history, and epidemiological information. If result is POSITIVE SARS-CoV-2 target nucleic acids are DETECTED. The SARS-CoV-2 RNA is generally detectable in upper and lower  respiratory specimens dur ing the acute phase  of infection.  Positive  results are indicative of active infection with SARS-CoV-2.  Clinical  correlation with patient history and other diagnostic information is  necessary to determine patient infection status.  Positive results do  not rule out bacterial infection or co-infection with other viruses. If result is PRESUMPTIVE POSTIVE SARS-CoV-2 nucleic acids MAY BE PRESENT.   A presumptive positive result was obtained on the submitted specimen  and confirmed on repeat testing.  While 2019 novel coronavirus  (SARS-CoV-2) nucleic acids may be present in the submitted sample  additional confirmatory testing may be necessary for epidemiological  and / or clinical management purposes  to differentiate between  SARS-CoV-2 and other Sarbecovirus currently known to infect humans.  If clinically indicated additional testing with an alternate test  methodology [phone]) is advised. The SARS-CoV-2 RNA is generally  detectable in upper and lower respiratory sp ecimens during the acute  phase of infection. The expected result is Negative. Fact Sheet for Patients:  StrictlyIdeas.no Fact Sheet for Healthcare Providers: BankingDealers.co.za This test is not yet approved or cleared by the [place] FDA and has been authorized for detection and/or diagnosis of SARS-CoV-2 by FDA under an Emergency Use  Authorization (EUA).  This EUA will remain in effect (meaning this test can be used) for the duration of the COVID-19 declaration under Section 564(b)(1) of the Act, 21 U.S.C. section 360bbb-3(b)(1), unless the authorization is terminated or revoked sooner. Performed at [facility], [place] [address], [place], [place] [address]   Surgical pcr screen     Status: None   Collection Time: 08/30/18 11:31 AM  Result Value Ref Range Status   MRSA, PCR NEGATIVE NEGATIVE Final   Staphylococcus aureus NEGATIVE NEGATIVE Final    Comment: (NOTE) The Xpert  SA Assay (FDA approved for NASAL specimens in patients 38 years of age and older), is one component of a comprehensive surveillance program. It is not intended to diagnose infection nor to guide or monitor treatment. Performed at [facility], [place] [address] [place], [place] [address]      Labs: Basic Metabolic Panel: Recent Labs  Lab 08/29/18 1430 08/30/18 0209 08/31/18 0205 09/02/18 0349 09/03/18 0236  NA 137 138 138 139 134*  K 4.6 4.7 4.2 4.1 4.7  CL 107 106 107 111 106  CO2 22 24 22 23 22   GLUCOSE 155* 197* 178* 191* 255*  BUN 21 17 12 9 11   CREATININE 1.66* 1.67* 1.50* 1.45* 1.33*  CALCIUM 8.9 8.4* 8.2* 8.0* 7.8*   Liver Function Tests: Recent Labs  Lab 08/29/18 1430 08/30/18 0209 09/02/18 0349 09/03/18 0236  AST 28 21 24 27   ALT 43 33 23 22  ALKPHOS 67 58 53 42  BILITOT 0.9 0.6 0.3 0.5  PROT 6.0* 5.1* 5.0* 4.9*  ALBUMIN 3.6 3.0* 2.6* 2.8*   Recent Labs  Lab 08/29/18 1430  LIPASE 31   No results for input(s): AMMONIA in the last 168 hours. CBC: Recent Labs  Lab 08/29/18 1430 08/30/18 0209 08/31/18 0205 09/02/18 0349 09/03/18 0236  WBC 7.9 6.2 4.8 4.7 7.5  NEUTROABS 5.7  --   --   --   --   HGB 11.6* 10.3* 10.4* 10.0* 9.8*  HCT 36.7 32.7* 33.0* 32.1* 31.1*  MCV 95.3 94.5 94.6 96.1 94.8  PLT 214 193 186 187 208   Cardiac Enzymes: No results for input(s): CKTOTAL, CKMB, CKMBINDEX, TROPONINI in the last 168 hours. BNP: BNP (last 3 results) No results for input(s): BNP in the last 8760 hours.  ProBNP (last 3 results) No results for input(s): PROBNP in the last 8760 hours.  CBG: Recent Labs  Lab 09/02/18 1106 09/02/18 1318 09/02/18 1433  GLUCAP 185* 223* 191*       Signed:  Domenic Polite MD.  [facility] 09/04/2018, 3:09 PM

## 2018-09-04 NOTE — Telephone Encounter (Signed)
Pt scheduled for telehealth visit with Dr. Jannifer Rodney 10/07/18@1 :30pm. Appt letter mailed to pt.

## 2018-09-04 NOTE — Telephone Encounter (Signed)
-----   Message from Irving Copas., MD sent at 09/03/2018  5:43 PM EDT ----- Regarding: Follow-up Audrey [name],Please set up a telehealth visit in approximately 4 to 5 weeks.Thanks.GM

## 2018-09-04 NOTE — Progress Notes (Signed)
[facility] Progress Note  2 Days Post-Op  Subjective: CC-  Sitting up eating breakfast. States that she feels better today than yesterday. Tolerating diet. Denies n/v. States that she had multiple BMs yesterday. Feels less bloated. Abdomen sore at incisions but overall pain is improved.  Ready to go home.  Objective: Vital signs in last 24 hours: Temp:  [98.1 F (36.7 C)-98.3 F (36.8 C)] 98.1 F (36.7 C) (05/15 0456) Pulse Rate:  [56-63] 56 (05/15 0456) Resp:  [16-18] 18 (05/15 0456) BP: (114-156)/(55-79) 156/59 (05/15 0456) SpO2:  [98 %-100 %] 99 % (05/15 0456) Last BM Date: 09/03/18  Intake/Output from previous day: 05/14 0701 - 05/15 0700 In: 1200 [P.O.:1200] Out: -  Intake/Output this shift: No intake/output data recorded.  PE: Gen: Alert, NAD HEENT: EOM's intact, pupils equal and round Pulm: effort normal Abd: Soft,nondistended, mild tenderness around umbilical incision,+BS, incisions cdi without erythema or drainage Skin: warm and dry   Lab Results:  Recent Labs    09/02/18 0349 09/03/18 0236  WBC 4.7 7.5  HGB 10.0* 9.8*  HCT 32.1* 31.1*  PLT 187 208   BMET Recent Labs    09/02/18 0349 09/03/18 0236  NA 139 134*  K 4.1 4.7  CL 111 106  CO2 23 22  GLUCOSE 191* 255*  BUN 9 11  CREATININE 1.45* 1.33*  CALCIUM 8.0* 7.8*   PT/INR Recent Labs    09/01/18 1850  LABPROT 13.7  INR 1.1   CMP     Component Value Date/Time   NA 134 (L) 09/03/2018 0236   NA 140 04/04/2017 0830   K 4.7 09/03/2018 0236   K 4.1 04/04/2017 0830   CL 106 09/03/2018 0236   CO2 22 09/03/2018 0236   CO2 21 (L) 04/04/2017 0830   GLUCOSE 255 (H) 09/03/2018 0236   GLUCOSE 107 04/04/2017 0830   BUN 11 09/03/2018 0236   BUN 37.0 (H) 04/04/2017 0830   CREATININE 1.33 (H) 09/03/2018 0236   CREATININE 1.75 (H) 08/28/2018 1017   CREATININE 2.2 (H) 04/04/2017 0830   CALCIUM 7.8 (L) 09/03/2018 0236   CALCIUM 10.1 04/04/2017 0830   PROT 4.9 (L)  09/03/2018 0236   PROT 7.5 04/04/2017 0830   ALBUMIN 2.8 (L) 09/03/2018 0236   ALBUMIN 4.0 04/04/2017 0830   AST 27 09/03/2018 0236   AST 18 08/28/2018 1017   AST 19 04/04/2017 0830   ALT 22 09/03/2018 0236   ALT 52 (H) 08/28/2018 1017   ALT 12 04/04/2017 0830   ALKPHOS 42 09/03/2018 0236   ALKPHOS 65 04/04/2017 0830   BILITOT 0.5 09/03/2018 0236   BILITOT 0.4 08/28/2018 1017   BILITOT 0.34 04/04/2017 0830   GFRNONAA 39 (L) 09/03/2018 0236   GFRNONAA 28 (L) 08/28/2018 1017   GFRAA 45 (L) 09/03/2018 0236   GFRAA 32 (L) 08/28/2018 1017   Lipase     Component Value Date/Time   LIPASE 31 08/29/2018 1430       Studies/Results: No results found.  Anti-infectives: Anti-infectives (From admission, onward)   Start     Dose/Rate Route Frequency Ordered Stop   09/03/18 2200  ciprofloxacin (CIPRO) IVPB 400 mg     400 mg 200 mL/hr over 60 Minutes Intravenous Every 24 hours 09/03/18 1342 09/03/18 2259   09/03/18 2000  metroNIDAZOLE (FLAGYL) IVPB 500 mg     500 mg 100 mL/hr over 60 Minutes Intravenous Every 8 hours 09/03/18 1342 09/03/18 2148   09/02/18 1048  ceFAZolin (ANCEF) 2-4 GM/100ML-% IVPB  Note to Pharmacy:  Marga Melnick   : cabinet override      09/02/18 1048 09/02/18 2259   08/29/18 2200  ciprofloxacin (CIPRO) IVPB 400 mg  Status:  Discontinued     400 mg 200 mL/hr over 60 Minutes Intravenous Every 24 hours 08/29/18 1955 09/03/18 1342   08/29/18 2000  metroNIDAZOLE (FLAGYL) IVPB 500 mg  Status:  Discontinued     500 mg 100 mL/hr over 60 Minutes Intravenous Every 8 hours 08/29/18 1955 09/03/18 1342       Assessment/Plan Metastatic renal cell carcinoma stable-just completed chemotherapy H/o vaginal and rectal prolapse HTN HLD Hypothyroidism GERD Chronic constipation  Acute on chronic cholecystitis History choledocholithiasis status post ERCP x3 S/p laparoscopic cholecystectomy 5/13 Dr. Barry Dienes - most recent ERCP 5/7 with 3 bile duct brushings, 1  collected suspicious for malignancy - POD #2 from lap chole - Patient stable for discharge from surgical standpoint. Continue bowel regimen due to chronic constipation. Follow up info and discharge instructions on AVS. rx for oxycodone sent to pharmacy.  ID -cipro/flagyl 5/9>>5/15 FEN -HH diet VTE -SCDs, sq heparin Foley -none Follow up - Dr. Barry Dienes Contact: son Elberta Fortis [phone]), daughter Daleen Snook [phone])    LOS: 6 days    Wellington Hampshire , [facility] 09/04/2018, 8:23 AM Pager: [phone] Mon-Thurs 7:00 am-4:30 pm Fri 7:00 am -11:30 AM Sat-Sun 7:00 am-11:30 am

## 2018-09-04 NOTE — Progress Notes (Signed)
HEMATOLOGY-ONCOLOGY PROGRESS NOTE  SUBJECTIVE: The patient reports that she is feeling better.  She is not having much discomfort in her abdomen following surgery.  Bowels are moving.  She is hoping to be discharged later today.  She has no new complaints.    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      Renal cell carcinoma ([place])   04/11/2017 Initial Diagnosis    Renal cell carcinoma ([place])    01/02/2018 -  Chemotherapy    The patient had nivolumab (OPDIVO) 240 mg in sodium chloride 0.9 % 100 mL chemo infusion, 240 mg, Intravenous, Once, 14 of 19 cycles Administration: 240 mg (01/02/2018), 240 mg (01/15/2018), 240 mg (01/30/2018), 240 mg (02/13/2018), 240 mg (02/27/2018), 240 mg (03/13/2018), 240 mg (03/27/2018), 240 mg (04/10/2018), 240 mg (04/24/2018), 240 mg (05/08/2018), 240 mg (05/22/2018), 240 mg (06/05/2018), 240 mg (06/19/2018), 240 mg (08/28/2018)  for chemotherapy treatment.       REVIEW OF SYSTEMS:   Constitutional: Denies fevers, chills Eyes: Denies blurriness of vision Ears, nose, mouth, throat, and face: Denies mucositis or sore throat Respiratory: Denies cough, dyspnea or wheezes Cardiovascular: Denies palpitation, chest discomfort Gastrointestinal: Abdominal fullness has improved.  Mild discomfort at the surgical site.  Bowels moving. Skin: Denies abnormal skin rashes Lymphatics: Denies new lymphadenopathy or easy  bruising Neurological:Denies numbness, tingling or new weaknesses Behavioral/Psych: Mood is stable, no new changes  Extremities: No lower extremity edema All other systems were reviewed with the patient and are negative.  I have reviewed the past medical history, past surgical history, social history and family history with the patient and they are unchanged from previous note.   PHYSICAL EXAMINATION:  Vitals:   09/03/18 2033 09/04/18 0456  BP: 114/79 (!) 156/59  Pulse: (!) 58 (!) 56  Resp: 16 18  Temp: 98.3 F (36.8 C) 98.1 F (36.7 C)  SpO2: 98% 99%   Filed Weights   08/29/18 1310 09/02/18 1113  Weight: 133 lb 6.1 oz (60.5 kg) 133 lb 6.1 oz (60.5 kg)    Intake/Output from previous day: 05/14 0701 - 05/15 0700 In: 1200 [P.O.:1200] Out: -   GENERAL:alert, no distress and comfortable SKIN: skin color, texture, turgor are normal, no rashes or significant lesions EYES: normal, Conjunctiva are pink and non-injected, sclera clear OROPHARYNX:no exudate, no erythema and lips, buccal mucosa, and tongue normal  NECK: supple, thyroid normal size, non-tender, without nodularity LYMPH:  no palpable lymphadenopathy in the cervical, axillary or inguinal LUNGS: clear to auscultation and percussion with normal breathing effort HEART: regular rate & rhythm and no murmurs and no lower extremity edema ABDOMEN: Normoactive bowel sounds, abdominal incision without drainage or redness. Musculoskeletal:no cyanosis of digits and no clubbing  NEURO: alert & oriented x 3 with fluent speech, no focal motor/sensory deficits  LABORATORY DATA:  I have reviewed the data as listed CMP Latest Ref Rng & Units 09/03/2018 09/02/2018 08/31/2018  Glucose 70 - 99 mg/dL 255(H) 191(H) 178(H)  BUN  8 - 23 mg/dL 11 9 12   Creatinine 0.44 - 1.00 mg/dL 1.33(H) 1.45(H) 1.50(H)  Sodium 135 - 145 mmol/L 134(L) 139 138  Potassium 3.5 - 5.1 mmol/L 4.7 4.1 4.2  Chloride 98 - 111 mmol/L 106 111 107  CO2 22 - 32 mmol/L 22  23 22   Calcium 8.9 - 10.3 mg/dL 7.8(L) 8.0(L) 8.2(L)  Total Protein 6.5 - 8.1 g/dL 4.9(L) 5.0(L) -  Total Bilirubin 0.3 - 1.2 mg/dL 0.5 0.3 -  Alkaline Phos 38 - 126 U/L 42 53 -  AST 15 - 41 U/L 27 24 -  ALT 0 - 44 U/L 22 23 -    Lab Results  Component Value Date   WBC 7.5 09/03/2018   HGB 9.8 (L) 09/03/2018   HCT 31.1 (L) 09/03/2018   MCV 94.8 09/03/2018   PLT 208 09/03/2018   NEUTROABS 5.7 08/29/2018    Ct Abdomen Pelvis Wo Contrast  Result Date: 08/29/2018 CLINICAL DATA:  Generalized abdominal pain EXAM: CT ABDOMEN AND PELVIS WITHOUT CONTRAST TECHNIQUE: Multidetector CT imaging of the abdomen and pelvis was performed following the standard protocol without IV contrast. COMPARISON:  PET-CT, 04/03/2018 FINDINGS: Lower chest: No acute abnormality.  Coronary artery calcifications. Hepatobiliary: No focal liver abnormality is seen. Status post biliary duct stenting with biliary ductal dilatation and pneumobilia. Gallstones in the gallbladder. Pancreas: Unremarkable. No pancreatic ductal dilatation or surrounding inflammatory changes. Spleen: Normal in size without focal abnormality. Adrenals/Urinary Tract: Adrenal glands are unremarkable. Status post left nephrectomy. Bladder is unremarkable. Stomach/Bowel: Stomach is within normal limits. Appendix appears normal. No evidence of bowel wall thickening, distention, or inflammatory changes. Large burden of dense stool in the colon. Pancolonic diverticulosis. Very redundant sigmoid colon. Vascular/Lymphatic: Calcific atherosclerosis. No enlarged abdominal or pelvic lymph nodes. Reproductive: No mass or other abnormality. Other: No abdominal wall hernia or abnormality. No abdominopelvic ascites. Musculoskeletal: No acute or significant osseous findings. IMPRESSION: 1. No definite acute non-contrast CT findings to explain abdominal pain. 2. Status post biliary ductal stenting with biliary ductal dilatation and pneumobilia. Gallstones in the gallbladder.  3. Large burden of dense stool in the colon. Pancolonic diverticulosis. Very redundant sigmoid colon. 4.  Status post left nephrectomy. Electronically Signed   By: Eddie Candle M.D.   On: 08/29/2018 16:04   Dg Chest 2 View  Result Date: 08/30/2018 CLINICAL DATA:  Preoperative chest radiograph prior to cholecystectomy. EXAM: CHEST - 2 VIEW COMPARISON:  05/05/2018 FINDINGS: UPPER limits normal heart size and RIGHT IJ Port-A-Cath with tip overlying the mid SVC again noted. There is no evidence of focal airspace disease, pulmonary edema, suspicious pulmonary nodule/mass, pleural effusion, or pneumothorax. No acute bony abnormalities are identified. IMPRESSION: UPPER limits normal heart size without evidence of acute cardiopulmonary disease. Electronically Signed   By: Margarette Canada M.D.   On: 08/30/2018 11:41   Dg Abd 1 View  Result Date: 08/19/2018 CLINICAL DATA:  Generalized abdominal pain and distention. Constipation. EXAM: ABDOMEN - 1 VIEW COMPARISON:  None. FINDINGS: Nonobstructive pattern of bowel gas with gas present to the rectum. Large burden of stool and stool balls in the left and right colon. No free air in the abdomen on supine radiographs. Calcific atherosclerosis about the abdomen. IMPRESSION: Nonobstructive pattern of bowel gas with gas present to the rectum. Large burden of stool and stool balls in the left and right colon. No free air in the abdomen on supine radiographs. Calcific atherosclerosis about the abdomen. Electronically Signed   By: Dorna Bloom.D.  On: 08/19/2018 11:40   Mr Abdomen Mrcp Wo Contrast  Result Date: 08/06/2018 CLINICAL DATA:  78 year old female with history of renal cell carcinoma status post left nephrectomy and chemotherapy. Abdominal pain for 1 month. Biliary ductal dilatation noted on recent ultrasound examination. EXAM: MRI ABDOMEN WITHOUT CONTRAST  (INCLUDING MRCP) TECHNIQUE: Multiplanar multisequence MR imaging of the abdomen was performed. Heavily  T2-weighted images of the biliary and pancreatic ducts were obtained, and three-dimensional MRCP images were rendered by post processing. COMPARISON:  No priors. FINDINGS: Comment: Today's study is limited for detection and characterization of visceral and/or vascular lesions by lack of IV gadolinium. Lower chest: Small hiatal hernia. Hepatobiliary: No definite suspicious hepatic lesions are confidently identified on today's noncontrast examination. MRCP images demonstrate multiple filling defects within the gallbladder, compatible with cholelithiasis. Gallbladder does not appear distended. Gallbladder wall does appear thickened and edematous, but there is no pericholecystic fluid noted at this time. There is mild intrahepatic biliary ductal dilatation, as well as some mild periductal increased T2 signal intensity which is most compatible with periportal edema. Common bile duct is dilated and measures up to 11 mm in the porta hepatis. In the distal common bile duct there is long segment narrowing of the distal common bile duct measuring 11 mm in length shortly before the level of the ampulla, concerning for a stricture (coronal image 9 of series 13). No definite filling defect within the common bile duct to suggest choledocholithiasis. Pancreas: No pancreatic mass confidently identified on today's noncontrast examination. No pancreatic ductal dilatation noted on MRCP images. No pancreatic or peripancreatic fluid or inflammatory changes. Spleen:  Unremarkable. Adrenals/Urinary Tract: Status post left nephrectomy. No abnormal soft tissue mass in the nephrectomy bed to suggest locally recurrent disease. Multiple ovoid shaped T1 hypointense, T2 hyperintense lesions associated with the right renal pelvis, presumably parapelvic cysts. No right hydroureteronephrosis. Right adrenal gland is normal in appearance. Left adrenal gland is not confidently identified may be surgically absent. Stomach/Bowel: Visualized portions are  unremarkable. Vascular/Lymphatic: Aortic atherosclerosis without definite aneurysm in the abdominal vasculature. No lymphadenopathy noted in the abdomen. Other: No significant volume of ascites noted in the visualized portions of the peritoneal cavity. Musculoskeletal: No aggressive appearing osseous lesions are noted in the visualized portions of the skeleton. IMPRESSION: 1. Distal common bile duct stricture measuring approximately 11 mm in length shortly before the level of the ampulla. This is associated with some mild intrahepatic and moderate extrahepatic biliary ductal dilatation, as well as some periportal edema in the hepatic parenchyma. 2. No choledocholithiasis. 3. Cholelithiasis with some gallbladder wall edema, but no frank evidence to strongly suggest an acute cholecystitis at this time. Electronically Signed   By: Vinnie Langton M.D.   On: 08/06/2018 12:02   Dg Ercp Biliary & Pancreatic Ducts  Result Date: 08/27/2018 CLINICAL DATA:  78 year old female with common bile duct dilation EXAM: ERCP TECHNIQUE: Multiple spot images obtained with the fluoroscopic device and submitted for interpretation post-procedure. FLUOROSCOPY TIME:  Fluoroscopy Time:  7 minutes COMPARISON:  MR 08/06/2018 FINDINGS: Limited intraoperative fluoroscopic spot images of ERCP. Initial image demonstrates endoscope projecting over the upper abdomen with cannulation of the ampulla and retrograde contrast. Borderline dilated extrahepatic biliary ducts. There is then deployment of a retrieval balloon. Final image demonstrates placement of 2 parallel plastic biliary stents. IMPRESSION: Limited images of ERCP demonstrates partial opacification of the extrahepatic biliary ducts, deployment of a retrieval balloon, and placement of parallel plastic biliary stents. Please refer to the dictated operative report for full  details of intraoperative findings and procedure. Electronically Signed   By: Corrie Mckusick D.O.   On: 08/27/2018 15:34    Dg Ercp Biliary & Pancreatic Ducts  Result Date: 08/10/2018 CLINICAL DATA:  Biliary stricture EXAM: ERCP TECHNIQUE: Multiple spot images obtained with the fluoroscopic device and submitted for interpretation post-procedure. FLUOROSCOPY TIME:  Fluoroscopy Time:  7 minutes and 10 seconds Radiation Exposure Index (if provided by the fluoroscopic device): Number of Acquired Spot Images: 0 COMPARISON:  None. FINDINGS: Imaging demonstrates cannulation of the common bile duct and contrast filling the biliary tree. Balloon stone retrieval is documented. A temporary biliary stent has been placed across the ampulla of Vater. IMPRESSION: See above. These images were submitted for radiologic interpretation only. Please see the procedural report for the amount of contrast and the fluoroscopy time utilized. Electronically Signed   By: Marybelle Killings M.D.   On: 08/10/2018 14:25   US Abdomen Limited Ruq  Result Date: 08/29/2018 CLINICAL DATA:  Abdominal pain EXAM: ULTRASOUND ABDOMEN LIMITED RIGHT UPPER QUADRANT COMPARISON:  CT from earlier the same. FINDINGS: Gallbladder: Gallbladder is well distended with multiple gallstones within. Wall thickness is at the upper limits of normal. Common bile duct: Stent is noted within the common bile duct. It is true diameter is difficult to assess due to the stent placement Liver: No focal lesion identified. Within normal limits in parenchymal echogenicity. Portal vein is patent on color Doppler imaging with normal direction of blood flow towards the liver. IMPRESSION: Common bile duct stents in place. No definitive biliary ductal dilatation is seen. The changes on the prior CT likely related to incomplete opacification of portal vein. Cholelithiasis with upper limits normal wall thickening Electronically Signed   By: Inez Catalina M.D.   On: 08/29/2018 17:26    ASSESSMENT AND PLAN: 78 y.o. 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 PET/CT 06/19/2017 -- consistent with partial metabolic response to treatment.  Rt adrenal gland bx - showed clear cell RCC  S/p CYtoreductive left radical nephrectomy and left adrenal gland resection on 08/01/2016 by Dr Alinda Money.  10/16/17 PET/CT revealed Continued improvement, with the T7 metastatic lesion no longer significantly hypermetabolic. The previous right lower lobe pulmonary nodule is even less apparent, perhaps about 2 mm in diameter today; given that this measured 6 mm on 03/28/2017 this probably represents an effectively treated metastatic lesion. Currently no appreciable hypermetabolic activity is identified to suggest active malignancy. Distended gallbladder with gallbladder wall thickening and gallstones. Correlate clinically in assessing for cholecystitis. Small but abnormal amount of free pelvic fluid, nonspecific. Other imaging findings of potential clinical significance: Chronic ethmoid sinusitis. Aortic Atherosclerosis. Stable 5 mm right middle lobe pulmonary nodule, not hypermetabolic but below sensitive PET-CT size thresholds. Left nephrectomy. Notable pelvic floor laxity with cystocele. Chronic bilateral Sacroiliitis.  04/03/18 PET/CT revealed No evidence for new or progressive hypermetabolic disease on today's study to suggest metastatic progression. 2. Stable appearance of the T7 metastatic lesion without Hypermetabolism. 3. Tiny focus of FDG accumulation identified along the skin of the low right inguinal fold. No associated lesion evident on CT. This may be related to urinary contaminant. 4. Cholelithiasis with similar appearance of diffuse gallbladder wall thickening. 5. Stable 5 mm right middle lobe pulmonary nodule. 6. Aortic Atherosclerois. 7. Diffuse colonic diverticulosis.   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  #  3  Small pulmonary lesions -- 10/16/17 PET/CT showed pulmonary less apparent than 03/28/17 PET/CT, decreasing from 63mm to 76mm diameter.   MRI brain shows no evidence of metastatic disease  #4 T7/8 Bone metastases - received Xgeva every 4 weeks from May 2018 to June 2019. 10/16/17 PET/CT revealed improvement with T7 metastatic lesion no longer significantly hypermetabolic.  -on Marchelle Folks  #5 ?liver mets- rpt PET/CT from 10/16/2017 shows no overt evidence of metastatic disease in the liver.  #6 Acute on chronic renal insufficiency Creatine stable  #7 Choledocholithiasis and cholelithiasis 07/10/18 US Abdomen revealed Biliary duct dilatation with 8 mm calculus at the level of the ampulla in the distal common bile duct. 2. Cholelithiasis. No gallbladder wall thickening or pericholecystic fluid. 3. Appearance of the liver raises concern for underlying hepatic cirrhosis. No focal liver lesions are demonstrable. 4. Left kidney absent. 5. Small cysts in right kidney. 6. Aortic Atherosclerosis.  S/p ERCP on 08/10/18 and 08/27/2018 with Dr. Justice Britain.  Bile duct brushings suspicious for malignancy.  Surgical pathology from 09/02/2018 acute and chronic cholecystitis with cholelithiasis.  PLAN:  -The patient will keep her follow-up appoint with [name] as previously scheduled on 09/11/2018.   LOS: 6 days   Mikey Bussing, DNP, AGPCNP-BC, AOCNP 09/04/18

## 2018-09-04 NOTE — Progress Notes (Signed)
VAST RN to complete central line care this am. Noted no blood return from port. Last chest x-ray from 5/10 showed port tip to be in appropriate place. Pt stated she has not had issues with getting blood return previously. Attempted various positions, as well as coughing and turning head without success.  Ordered and instilled TPA to port to instill. After one hour, unable to draw back. Allowed TPA to instill for a 2nd hour. After 2 hrs dwell time, was able to draw back 1 mL TPA. Pt requested we let medicine dwell a little longer. Reinstilled 1 mL TPA. After 2 hrs and 45 mins of dwell time, able to draw all TPA out with sluggish blood return. Port power flushed with 10 mL NS. Blood return remained sluggish. Flushed with NS and followed with Heparin flush as charted in MAR.  Pt verbalized she has an appt for infusion next Friday. VAST RN suggested she contact her oncologist on Monday to inform of sluggish blood return despite TPA. Pt verbalized she will contact office.  Pt asked if [place] would be able to see this RN's actions. RN advised that staff can read note.

## 2018-09-04 NOTE — Progress Notes (Signed)
Discharge instructions reviewed with patient, including next dose times for medications.  Patient also instructed to call primary MD re: port and limited blood return.  Patient verbalizes understanding; and able to answer teach back questions. Printed AVS given to patient. Patient d/c'd to home via wheelchair.

## 2018-09-05 NOTE — Anesthesia Postprocedure Evaluation (Signed)
Anesthesia Post Note  Patient: Dorethea Clan Schelling  Procedure(s) Performed: LAPAROSCOPIC CHOLECYSTECTOMY (N/A Abdomen)     Patient location during evaluation: PACU Anesthesia Type: General Level of consciousness: awake and alert Pain management: pain level controlled Vital Signs Assessment: post-procedure vital signs reviewed and stable Respiratory status: spontaneous breathing, nonlabored ventilation, respiratory function stable and patient connected to nasal cannula oxygen Cardiovascular status: blood pressure returned to baseline and stable Postop Assessment: no apparent nausea or vomiting Anesthetic complications: no    Last Vitals:  Vitals:   09/04/18 0456 09/04/18 1449  BP: (!) 156/59 134/68  Pulse: (!) 56 73  Resp: 18 18  Temp: 36.7 C 36.9 C  SpO2: 99% 99%    Last Pain:  Vitals:   09/04/18 1449  TempSrc: Oral  PainSc:                  Jiovanni Heeter COKER

## 2018-09-07 ENCOUNTER — Telehealth: Payer: Self-pay | Admitting: *Deleted

## 2018-09-07 NOTE — Telephone Encounter (Signed)
Patient called. While in hospital last week for surgery, there was problem with blood return with her port on d/c day. With Cathflow, a small amount of blood was returned. The nurse asked her to notify Dr. Grier Mitts office with this information to determine if she needed to be seen sooner than Friday 5/20 for Port flush.

## 2018-09-07 NOTE — Telephone Encounter (Signed)
Per [name], patient is ok to wait until Friday 5/22 app (date corrected from previous message) for part flush. Message given to husband.

## 2018-09-10 NOTE — Progress Notes (Signed)
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 The patient's last visit with [name] was on 08/28/18. The pt reports that she is doing well overall.  In the interim, the pt had a laparoscopic cholecystectomy on 09/02/18.  The pt reports that she is healing well from surgery, though she has lost some weight and endorses some improving abdominal discomfort. She notes that she is eating better. The pt denies any concerns for infections at this time.  Lab results today (09/11/18) of CBC w/diff and CMP is as follows: all values are WNL except for WBC at 10.7k, HGB at 11.4, ANC at 8.1k, Monocytes abs at 1.2k, Abs immature granulocytes at 0.10k, Glucose at 172, Creatinine at 1.43, Calcium at 8.0, Total Protein at 6.3, Albumin at 3.2, GFR at 35.  On review of systems, pt reports improving abdominal discomfort, slowly eating better, and denies concerns for infections, leg swelling, and any other symptoms.   MEDICAL HISTORY:    Hypertension Dyslipidemia Osteoarthritis Ex-smoker Coronary artery disease Thyroid disorder-was apparently on levothyroxine 25 g daily which has subsequently been discontinued . 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 Earley Brooke Colonoscopy 03/2016 Dr. Earley Brooke  SOCIAL HISTORY: Social History   Socioeconomic History   Marital status: Married    Spouse  name: Not on file   Number of children: 3   Years of education: Not on file   Highest education level: Not on file  Occupational History   Occupation: retired  Scientist, product/process development strain: Not on file   Food insecurity:    Worry: Not on file    Inability: Not on file   Transportation needs:    Medical: Not on file    Non-medical: Not on file  Tobacco Use   Smoking status: Former Smoker    Packs/day: 1.00    Years: 8.00    Pack years: 8.00    Types: Cigarettes    Last attempt to quit: 06/18/1964    Years since quitting: 54.2   Smokeless tobacco: Never Used  Substance and Sexual Activity   Alcohol use: No   Drug use: No   Sexual activity: Not on file  Lifestyle   Physical activity:    Days per week: Not on file    Minutes per session: Not on file   Stress: Not on file  Relationships   Social connections:    Talks on phone: Not on file    Gets together: Not on file    Attends religious service: Not on file    Active member of club or organization: Not on file    Attends meetings of clubs or organizations: Not on file    Relationship status: Not on file   Intimate partner violence:    Fear of current or ex partner: Not on file    Emotionally abused: Not on file    Physically abused: Not on file  Forced sexual activity: Not on file  Other Topics Concern   Not on file  Social History Narrative   Married.  Three children   Patient lives in [place] Works as a Solicitor part-time Ex-smoker quit long time ago In 1961 . Smoked 2 packs per day for 8 years prior to that .  or a lot of stress since her daughter is also getting treated for stage IV uterine cancer.  FAMILY HISTORY:  Mother deceased Father with asthma and emphysema died at 22 years with the MI. Brother deceased of heart disease   ALLERGIES:  is allergic to adhesive [tape]; nsaids; and statins.  MEDICATIONS:  Current Outpatient Medications  Medication Sig  Dispense Refill   acetaminophen (TYLENOL) 325 MG tablet Take 2 tablets (650 mg total) by mouth every 6 (six) hours as needed for mild pain.     amLODipine (NORVASC) 5 MG tablet Take 5 mg by mouth daily.   6   atorvastatin (LIPITOR) 40 MG tablet Take 40 mg by mouth at bedtime.      bisacodyl (DULCOLAX) 5 MG EC tablet Take 5 mg by mouth at bedtime.     Calcium 600-200 MG-UNIT tablet Take 1 tablet by mouth daily after breakfast.      carvedilol (COREG) 6.25 MG tablet Take 6.25 mg by mouth 2 (two) times daily.  6   clobetasol ointment (TEMOVATE) 7.51 % Apply 1 application topically 2 (two) times daily as needed (lichen sclerosis).      docusate sodium (COLACE) 100 MG capsule Take 100 mg by mouth at bedtime.     hydrocortisone (CORTEF) 10 MG tablet Take 5-10 mg by mouth See admin instructions. Take 1 tablet (10 mg) by mouth in the morning & 1/2 tablet (5 mg) by mouth in the evening.     levothyroxine (SYNTHROID, LEVOTHROID) 50 MCG tablet Take 50 mcg by mouth daily before breakfast.      lidocaine-prilocaine (EMLA) cream Apply to affected area once (Patient taking differently: Apply 1 application topically daily as needed (prior to port access). ) 30 g 3   Nivolumab (OPDIVO IV) Inject 1 Dose into the vein every 14 (fourteen) days. [place]     omeprazole (PRILOSEC) 20 MG capsule Take 1 capsule (20 mg total) by mouth 2 (two) times daily. 60 capsule 2   oxyCODONE (OXY IR/ROXICODONE) 5 MG immediate release tablet Take 1 tablet (5 mg total) by mouth every 6 (six) hours as needed for severe pain. 15 tablet 0   polyethylene glycol (MIRALAX / GLYCOLAX) 17 g packet Take 17 g by mouth daily as needed. 7 each 0   repaglinide (PRANDIN) 0.5 MG tablet Take 0.5 mg by mouth 2 (two) times daily before a meal.     No current facility-administered medications for this visit.     REVIEW OF SYSTEMS:    A 10+ POINT REVIEW OF SYSTEMS WAS OBTAINED including neurology, dermatology, psychiatry,  cardiac, respiratory, lymph, extremities, GI, GU, Musculoskeletal, constitutional, breasts, reproductive, HEENT.  All pertinent positives are noted in the HPI.  All others are negative.   PHYSICAL EXAMINATION:  ECOG PERFORMANCE STATUS: 1 - Symptomatic but completely ambulatory  Vitals:   09/11/18 0958  BP: 131/60  Pulse: 73  Resp: 17  Temp: 98.5 F (36.9 C)  SpO2: 99%   Filed Weights   09/11/18 0958  Weight: 128 lb 12.8 oz (58.4 kg)   .Body mass index is 26.01 kg/m.   GENERAL:alert, in no acute distress and comfortable SKIN:  no acute rashes, no significant lesions EYES: conjunctiva are pink and non-injected, sclera anicteric OROPHARYNX: MMM, no exudates, no oropharyngeal erythema or ulceration NECK: supple, no JVD LYMPH:  no palpable lymphadenopathy in the cervical, axillary or inguinal regions LUNGS: clear to auscultation b/l with normal respiratory effort HEART: regular rate & rhythm ABDOMEN:  normoactive bowel sounds , non tender, not distended. No palpable hepatosplenomegaly.  Extremity: no pedal edema PSYCH: alert & oriented x 3 with fluent speech NEURO: no focal motor/sensory deficits   LABORATORY DATA:  I have reviewed the data as listed  . CBC Latest Ref Rng & Units 09/11/2018 09/03/2018 09/02/2018  WBC 4.0 - 10.5 K/uL 10.7(H) 7.5 4.7  Hemoglobin 12.0 - 15.0 g/dL 11.4(L) 9.8(L) 10.0(L)  Hematocrit 36.0 - 46.0 % 37.3 31.1(L) 32.1(L)  Platelets 150 - 400 K/uL 277 208 187    CBC    Component Value Date/Time   WBC 10.7 (H) 09/11/2018 0900   WBC 7.5 09/03/2018 0236   RBC 3.91 09/11/2018 0900   HGB 11.4 (L) 09/11/2018 0900   HGB 11.2 (L) 04/04/2017 0830   HCT 37.3 09/11/2018 0900   HCT 35.2 04/04/2017 0830   PLT 277 09/11/2018 0900   PLT 250 04/04/2017 0830   MCV 95.4 09/11/2018 0900   MCV 107.6 (H) 04/04/2017 0830   MCH 29.2 09/11/2018 0900   MCHC 30.6 09/11/2018 0900   RDW 13.4 09/11/2018 0900   RDW 14.0 04/04/2017 0830   LYMPHSABS 1.1 09/11/2018 0900     LYMPHSABS 1.6 04/04/2017 0830   MONOABS 1.2 (H) 09/11/2018 0900   MONOABS 0.4 04/04/2017 0830   EOSABS 0.2 09/11/2018 0900   EOSABS 0.1 04/04/2017 0830   BASOSABS 0.1 09/11/2018 0900   BASOSABS 0.0 04/04/2017 0830     . CMP Latest Ref Rng & Units 09/11/2018 09/03/2018 09/02/2018  Glucose 70 - 99 mg/dL 172(H) 255(H) 191(H)  BUN 8 - 23 mg/dL 16 11 9   Creatinine 0.44 - 1.00 mg/dL 1.43(H) 1.33(H) 1.45(H)  Sodium 135 - 145 mmol/L 136 134(L) 139  Potassium 3.5 - 5.1 mmol/L 4.5 4.7 4.1  Chloride 98 - 111 mmol/L 102 106 111  CO2 22 - 32 mmol/L 24 22 23   Calcium 8.9 - 10.3 mg/dL 8.0(L) 7.8(L) 8.0(L)  Total Protein 6.5 - 8.1 g/dL 6.3(L) 4.9(L) 5.0(L)  Total Bilirubin 0.3 - 1.2 mg/dL 0.7 0.5 0.3  Alkaline Phos 38 - 126 U/L 93 42 53  AST 15 - 41 U/L 31 27 24   ALT 0 - 44 U/L 43 22 23   B12 level 299 (OSH)-->455  . Lab Results  Component Value Date   LDH 136 03/13/2018        08/27/18 Surgical Pathology from Cholecystectomy:         RADIOGRAPHIC STUDIES: I have personally reviewed the radiological images as listed and agreed with the findings in the report. Nm Pet Image Restag (ps) Skull Base To Thigh  Result Date: 01/03/2017 IMPRESSION: 1. There is a new lesion in the hepatic dome which is FDG avid and low in attenuation on CT imaging consistent with metastatic disease. Another region of uptake in the left hepatic lobe posteriorly demonstrates no CT correlate. A second subtle metastasis is not excluded on today's study. An MRI could better assess for other hepatic metastases if clinically warranted. 2. New metastatic lesion in the left side of T8. 3. New pulmonary nodule in the right upper lobe. This is too small to characterize but suspicious. Recommend attention on follow-up. No FDG avid  nodules or other enlarging nodules. 4. The uptake at the previous left renal artery has almost resolved in the interval and is favored to be post therapeutic. Recommend attention on follow-up.  5. The metastasis in the posterior right hilum seen previously has resolved. Electronically Signed   By: Dorise Bullion III M.D   On: 01/03/2017 11:16   .Ct Abdomen Pelvis Wo Contrast  Result Date: 08/29/2018 CLINICAL DATA:  Generalized abdominal pain EXAM: CT ABDOMEN AND PELVIS WITHOUT CONTRAST TECHNIQUE: Multidetector CT imaging of the abdomen and pelvis was performed following the standard protocol without IV contrast. COMPARISON:  PET-CT, 04/03/2018 FINDINGS: Lower chest: No acute abnormality.  Coronary artery calcifications. Hepatobiliary: No focal liver abnormality is seen. Status post biliary duct stenting with biliary ductal dilatation and pneumobilia. Gallstones in the gallbladder. Pancreas: Unremarkable. No pancreatic ductal dilatation or surrounding inflammatory changes. Spleen: Normal in size without focal abnormality. Adrenals/Urinary Tract: Adrenal glands are unremarkable. Status post left nephrectomy. Bladder is unremarkable. Stomach/Bowel: Stomach is within normal limits. Appendix appears normal. No evidence of bowel wall thickening, distention, or inflammatory changes. Large burden of dense stool in the colon. Pancolonic diverticulosis. Very redundant sigmoid colon. Vascular/Lymphatic: Calcific atherosclerosis. No enlarged abdominal or pelvic lymph nodes. Reproductive: No mass or other abnormality. Other: No abdominal wall hernia or abnormality. No abdominopelvic ascites. Musculoskeletal: No acute or significant osseous findings. IMPRESSION: 1. No definite acute non-contrast CT findings to explain abdominal pain. 2. Status post biliary ductal stenting with biliary ductal dilatation and pneumobilia. Gallstones in the gallbladder. 3. Large burden of dense stool in the colon. Pancolonic diverticulosis. Very redundant sigmoid colon. 4.  Status post left nephrectomy. Electronically Signed   By: Eddie Candle M.D.   On: 08/29/2018 16:04   Dg Chest 2 View  Result Date: 08/30/2018 CLINICAL DATA:   Preoperative chest radiograph prior to cholecystectomy. EXAM: CHEST - 2 VIEW COMPARISON:  05/05/2018 FINDINGS: UPPER limits normal heart size and RIGHT IJ Port-A-Cath with tip overlying the mid SVC again noted. There is no evidence of focal airspace disease, pulmonary edema, suspicious pulmonary nodule/mass, pleural effusion, or pneumothorax. No acute bony abnormalities are identified. IMPRESSION: UPPER limits normal heart size without evidence of acute cardiopulmonary disease. Electronically Signed   By: Margarette Canada M.D.   On: 08/30/2018 11:41   Dg Abd 1 View  Result Date: 08/19/2018 CLINICAL DATA:  Generalized abdominal pain and distention. Constipation. EXAM: ABDOMEN - 1 VIEW COMPARISON:  None. FINDINGS: Nonobstructive pattern of bowel gas with gas present to the rectum. Large burden of stool and stool balls in the left and right colon. No free air in the abdomen on supine radiographs. Calcific atherosclerosis about the abdomen. IMPRESSION: Nonobstructive pattern of bowel gas with gas present to the rectum. Large burden of stool and stool balls in the left and right colon. No free air in the abdomen on supine radiographs. Calcific atherosclerosis about the abdomen. Electronically Signed   By: Eddie Candle M.D.   On: 08/19/2018 11:40   Dg Ercp Biliary & Pancreatic Ducts  Result Date: 08/27/2018 CLINICAL DATA:  78 year old female with common bile duct dilation EXAM: ERCP TECHNIQUE: Multiple spot images obtained with the fluoroscopic device and submitted for interpretation post-procedure. FLUOROSCOPY TIME:  Fluoroscopy Time:  7 minutes COMPARISON:  MR 08/06/2018 FINDINGS: Limited intraoperative fluoroscopic spot images of ERCP. Initial image demonstrates endoscope projecting over the upper abdomen with cannulation of the ampulla and retrograde contrast. Borderline dilated extrahepatic biliary ducts. There is then deployment of a retrieval balloon. Final image  demonstrates placement of 2 parallel plastic  biliary stents. IMPRESSION: Limited images of ERCP demonstrates partial opacification of the extrahepatic biliary ducts, deployment of a retrieval balloon, and placement of parallel plastic biliary stents. Please refer to the dictated operative report for full details of intraoperative findings and procedure. Electronically Signed   By: Corrie Mckusick D.O.   On: 08/27/2018 15:34   US Abdomen Limited Ruq  Result Date: 08/29/2018 CLINICAL DATA:  Abdominal pain EXAM: ULTRASOUND ABDOMEN LIMITED RIGHT UPPER QUADRANT COMPARISON:  CT from earlier the same. FINDINGS: Gallbladder: Gallbladder is well distended with multiple gallstones within. Wall thickness is at the upper limits of normal. Common bile duct: Stent is noted within the common bile duct. It is true diameter is difficult to assess due to the stent placement Liver: No focal lesion identified. Within normal limits in parenchymal echogenicity. Portal vein is patent on color Doppler imaging with normal direction of blood flow towards the liver. IMPRESSION: Common bile duct stents in place. No definitive biliary ductal dilatation is seen. The changes on the prior CT likely related to incomplete opacification of portal vein. Cholelithiasis with upper limits normal wall thickening Electronically Signed   By: Inez Catalina M.D.   On: 08/29/2018 17:26    ASSESSMENT & PLAN:    78 y.o. 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 PET/CT 06/19/2017 -- consistent with partial metabolic response to treatment.  Rt adrenal gland bx - showed clear cell RCC  S/p CYtoreductive left radical nephrectomy and left adrenal gland resection on 08/01/2016 by Dr Alinda Money.  10/16/17 PET/CT revealed Continued improvement, with the T7 metastatic lesion no longer significantly hypermetabolic. The previous right lower lobe pulmonary nodule is even less  apparent, perhaps about 2 mm in diameter today; given that this measured 6 mm on 03/28/2017 this probably represents an effectively treated metastatic lesion. Currently no appreciable hypermetabolic activity is identified to suggest active malignancy. Distended gallbladder with gallbladder wall thickening and gallstones. Correlate clinically in assessing for cholecystitis. Small but abnormal amount of free pelvic fluid, nonspecific. Other imaging findings of potential clinical significance: Chronic ethmoid sinusitis. Aortic Atherosclerosis. Stable 5 mm right middle lobe pulmonary nodule, not hypermetabolic but below sensitive PET-CT size thresholds. Left nephrectomy. Notable pelvic floor laxity with cystocele. Chronic bilateral Sacroiliitis.  04/03/18 PET/CT revealed No evidence for new or progressive hypermetabolic disease on today's study to suggest metastatic progression. 2. Stable appearance of the T7 metastatic lesion without Hypermetabolism. 3. Tiny focus of FDG accumulation identified along the skin of the low right inguinal fold. No associated lesion evident on CT. This may be related to urinary contaminant. 4. Cholelithiasis with similar appearance of diffuse gallbladder wall thickening. 5. Stable 5 mm right middle lobe pulmonary nodule. 6.  Aortic Atherosclerois. 7. Diffuse colonic diverticulosis.   #2 b/l adrenal metastases from [place] s/p left adrenalectomy with adrenal insuff - follows with Dr Buddy Duty.  #3 Small pulmonary lesions -- 10/16/17 PET/CT showed pulmonary less apparent than 03/28/17 PET/CT, decreasing from 34mm to 68mm diameter.   MRI brain shows no evidence of metastatic disease  #4 T7/8 Bone metastases - received Xgeva every 4 weeks from May 2018 to June 2019. 10/16/17 PET/CT revealed improvement with T7 metastatic lesion no longer significantly hypermetabolic.  -on Marchelle Folks  #5 ?liver mets- rpt PET/CT from 10/16/2017 shows no overt evidence of metastatic disease in the liver.  #6  Grade 1 Nausea -  improved and intermittent. Hasnt used her anti-emetic as instructed so some nausea and decreased po intake.  #7 Grade 1 Diarrhea - resolved  #8 Hyponatremia -  resolved with sodium at 137 - likely related to some element of adrenal insufficiency, diarrhea, limited by mouth intake. Primarily solute free fluid intake.   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 Buddy Duty  #14 HTN - ?control. Patient tends to be anxious and has higher blood pressures in the clinic. She can have increased blood pressure from Sutent as well. Plan: -continued close f/u with her PCP /cardiology regarding the many elements necessary for her care that are not directly related to her oncology care. -ACEI held due to AKI and hyperkalemia - following with cardiology to mx this. Has been started on Amlodipine instead.  #15 Grade 1 mucositis- resolved -Advised the patient to continue use of Magic Mouthwash to aid with nutrition  #16 Newly diagnosed ?CAD - positive cardiac nuclear stress test Following with cardiology and nephrology ? Need for pre-operative cardiac cath  #17 Choledocholithiasis and cholelithiasis 07/10/18 US Abdomen revealed Biliary duct dilatation with 8 mm calculus at the level of the ampulla  in the distal common bile duct. 2. Cholelithiasis. No gallbladder wall thickening or pericholecystic fluid. 3. Appearance of the liver raises concern for underlying hepatic cirrhosis. No focal liver lesions are demonstrable. 4.  Left kidney absent. 5.  Small cysts in right kidney. 6.  Aortic Atherosclerosis.  S/p ERCP on 07/23/18 with Dr. Valarie Merino Mansouraty  PLAN:  -Discussed pt labwork today, 09/11/18; HGB improved to 11.4, liver functions have normalized s/p cholecystectomy. Kidney functions stable. -The pt has no prohibitive toxicities from continuing C10 Nivolumab every 2 weeks at this time. -Discussed that one of the cytopathologies from her 09/02/18 ERCP was suspicious for malignant cells in bile duct, but was not definitive, other two findings were benign. -Discussed that if the pt were to actually have a primary bile duct neoplasm, there would be major surgery required and given the significance of these considerations I will speak with pathologist in the interim -Continue follow up with endocrinology -Recommend using Senna or Miralax for constipation -Continue with OTC Lidocaine patch for lower back pain  -Recommend that the pt keep well moisturized, suspecting that patient does have dry skin -Hydroxyzine as well for itching -Continue Xgeva every 4 weeks -Recommended that the pt continue to eat well, drink at least 48-64 oz of water each day, and walk 20-30 minutes each day. -Will see the pt back in 4 weeks    -continue nivolumab q2weeks with labs- plz schedule 4 doses -RTC with Dr Irene Limbo in 4 weeks    All of the patients questions were answered with apparent satisfaction. The patient knows to call the clinic with any problems, questions or concerns.  The total time spent in the appt was 25  minutes and more than 50% was on counseling and direct patient cares.   Sullivan Lone MD [place] [facility] Hematology/Oncology Physician [facility]  (Office):        [phone] (Work cell):  [phone] (Fax):           [phone]  I, Baldwin [place], am acting as a scribe for Dr. Sullivan Lone.   .I have reviewed the above documentation for accuracy and completeness, and I agree with the above. Brunetta Genera MD

## 2018-09-11 ENCOUNTER — Inpatient Hospital Stay: Payer: Medicare Other

## 2018-09-11 ENCOUNTER — Other Ambulatory Visit: Payer: Self-pay

## 2018-09-11 ENCOUNTER — Inpatient Hospital Stay (HOSPITAL_BASED_OUTPATIENT_CLINIC_OR_DEPARTMENT_OTHER): Payer: Medicare Other | Admitting: Hematology

## 2018-09-11 VITALS — BP 131/60 | HR 73 | Temp 98.5°F | Resp 17 | Ht 59.0 in | Wt 128.8 lb

## 2018-09-11 DIAGNOSIS — Z5112 Encounter for antineoplastic immunotherapy: Secondary | ICD-10-CM

## 2018-09-11 DIAGNOSIS — I340 Nonrheumatic mitral (valve) insufficiency: Secondary | ICD-10-CM | POA: Diagnosis not present

## 2018-09-11 DIAGNOSIS — Z95828 Presence of other vascular implants and grafts: Secondary | ICD-10-CM

## 2018-09-11 DIAGNOSIS — K8070 Calculus of gallbladder and bile duct without cholecystitis without obstruction: Secondary | ICD-10-CM | POA: Diagnosis not present

## 2018-09-11 DIAGNOSIS — C7951 Secondary malignant neoplasm of bone: Secondary | ICD-10-CM

## 2018-09-11 DIAGNOSIS — R945 Abnormal results of liver function studies: Secondary | ICD-10-CM

## 2018-09-11 DIAGNOSIS — E079 Disorder of thyroid, unspecified: Secondary | ICD-10-CM | POA: Diagnosis not present

## 2018-09-11 DIAGNOSIS — E039 Hypothyroidism, unspecified: Secondary | ICD-10-CM | POA: Diagnosis not present

## 2018-09-11 DIAGNOSIS — C642 Malignant neoplasm of left kidney, except renal pelvis: Secondary | ICD-10-CM

## 2018-09-11 DIAGNOSIS — K649 Unspecified hemorrhoids: Secondary | ICD-10-CM

## 2018-09-11 DIAGNOSIS — Z905 Acquired absence of kidney: Secondary | ICD-10-CM

## 2018-09-11 DIAGNOSIS — E538 Deficiency of other specified B group vitamins: Secondary | ICD-10-CM | POA: Diagnosis not present

## 2018-09-11 DIAGNOSIS — E785 Hyperlipidemia, unspecified: Secondary | ICD-10-CM

## 2018-09-11 DIAGNOSIS — Z7189 Other specified counseling: Secondary | ICD-10-CM

## 2018-09-11 DIAGNOSIS — K5730 Diverticulosis of large intestine without perforation or abscess without bleeding: Secondary | ICD-10-CM | POA: Diagnosis not present

## 2018-09-11 DIAGNOSIS — K5900 Constipation, unspecified: Secondary | ICD-10-CM | POA: Diagnosis not present

## 2018-09-11 DIAGNOSIS — I252 Old myocardial infarction: Secondary | ICD-10-CM | POA: Diagnosis not present

## 2018-09-11 DIAGNOSIS — E875 Hyperkalemia: Secondary | ICD-10-CM | POA: Diagnosis not present

## 2018-09-11 DIAGNOSIS — C7972 Secondary malignant neoplasm of left adrenal gland: Secondary | ICD-10-CM

## 2018-09-11 DIAGNOSIS — I700 Atherosclerosis of aorta: Secondary | ICD-10-CM | POA: Diagnosis not present

## 2018-09-11 DIAGNOSIS — R1013 Epigastric pain: Secondary | ICD-10-CM

## 2018-09-11 DIAGNOSIS — E119 Type 2 diabetes mellitus without complications: Secondary | ICD-10-CM | POA: Diagnosis not present

## 2018-09-11 DIAGNOSIS — I129 Hypertensive chronic kidney disease with stage 1 through stage 4 chronic kidney disease, or unspecified chronic kidney disease: Secondary | ICD-10-CM | POA: Diagnosis not present

## 2018-09-11 DIAGNOSIS — M1990 Unspecified osteoarthritis, unspecified site: Secondary | ICD-10-CM | POA: Diagnosis not present

## 2018-09-11 DIAGNOSIS — E2740 Unspecified adrenocortical insufficiency: Secondary | ICD-10-CM

## 2018-09-11 DIAGNOSIS — I10 Essential (primary) hypertension: Secondary | ICD-10-CM

## 2018-09-11 DIAGNOSIS — K449 Diaphragmatic hernia without obstruction or gangrene: Secondary | ICD-10-CM

## 2018-09-11 DIAGNOSIS — I2510 Atherosclerotic heart disease of native coronary artery without angina pectoris: Secondary | ICD-10-CM

## 2018-09-11 DIAGNOSIS — C787 Secondary malignant neoplasm of liver and intrahepatic bile duct: Secondary | ICD-10-CM

## 2018-09-11 DIAGNOSIS — N189 Chronic kidney disease, unspecified: Secondary | ICD-10-CM

## 2018-09-11 DIAGNOSIS — Z87891 Personal history of nicotine dependence: Secondary | ICD-10-CM

## 2018-09-11 DIAGNOSIS — E1122 Type 2 diabetes mellitus with diabetic chronic kidney disease: Secondary | ICD-10-CM

## 2018-09-11 DIAGNOSIS — Z79899 Other long term (current) drug therapy: Secondary | ICD-10-CM

## 2018-09-11 DIAGNOSIS — E440 Moderate protein-calorie malnutrition: Secondary | ICD-10-CM

## 2018-09-11 DIAGNOSIS — R7989 Other specified abnormal findings of blood chemistry: Secondary | ICD-10-CM

## 2018-09-11 DIAGNOSIS — T464X5A Adverse effect of angiotensin-converting-enzyme inhibitors, initial encounter: Secondary | ICD-10-CM

## 2018-09-11 LAB — CMP (CANCER CENTER ONLY)
ALT: 43 U/L (ref 0–44)
AST: 31 U/L (ref 15–41)
Albumin: 3.2 g/dL — ABNORMAL LOW (ref 3.5–5.0)
Alkaline Phosphatase: 93 U/L (ref 38–126)
Anion gap: 10 (ref 5–15)
BUN: 16 mg/dL (ref 8–23)
CO2: 24 mmol/L (ref 22–32)
Calcium: 8 mg/dL — ABNORMAL LOW (ref 8.9–10.3)
Chloride: 102 mmol/L (ref 98–111)
Creatinine: 1.43 mg/dL — ABNORMAL HIGH (ref 0.44–1.00)
GFR, Est AFR Am: 41 mL/min — ABNORMAL LOW (ref >60)
GFR, Estimated: 35 mL/min — ABNORMAL LOW (ref >60)
Glucose, Bld: 172 mg/dL — ABNORMAL HIGH (ref 70–99)
Potassium: 4.5 mmol/L (ref 3.5–5.1)
Sodium: 136 mmol/L (ref 135–145)
Total Bilirubin: 0.7 mg/dL (ref 0.3–1.2)
Total Protein: 6.3 g/dL — ABNORMAL LOW (ref 6.5–8.1)

## 2018-09-11 LAB — CBC WITH DIFFERENTIAL (CANCER CENTER ONLY)
Abs Immature Granulocytes: 0.1 10*3/uL — ABNORMAL HIGH (ref 0.00–0.07)
Basophils Absolute: 0.1 10*3/uL (ref 0.0–0.1)
Basophils Relative: 1 %
Eosinophils Absolute: 0.2 10*3/uL (ref 0.0–0.5)
Eosinophils Relative: 2 %
HCT: 37.3 % (ref 36.0–46.0)
Hemoglobin: 11.4 g/dL — ABNORMAL LOW (ref 12.0–15.0)
Immature Granulocytes: 1 %
Lymphocytes Relative: 10 %
Lymphs Abs: 1.1 10*3/uL (ref 0.7–4.0)
MCH: 29.2 pg (ref 26.0–34.0)
MCHC: 30.6 g/dL (ref 30.0–36.0)
MCV: 95.4 fL (ref 80.0–100.0)
Monocytes Absolute: 1.2 10*3/uL — ABNORMAL HIGH (ref 0.1–1.0)
Monocytes Relative: 11 %
Neutro Abs: 8.1 10*3/uL — ABNORMAL HIGH (ref 1.7–7.7)
Neutrophils Relative %: 75 %
Platelet Count: 277 10*3/uL (ref 150–400)
RBC: 3.91 MIL/uL (ref 3.87–5.11)
RDW: 13.4 % (ref 11.5–15.5)
WBC Count: 10.7 10*3/uL — ABNORMAL HIGH (ref 4.0–10.5)
nRBC: 0 % (ref 0.0–0.2)

## 2018-09-11 MED ORDER — SODIUM CHLORIDE 0.9 % IV SOLN
Freq: Once | INTRAVENOUS | Status: AC
  Administered 2018-09-11: 11:00:00 via INTRAVENOUS
  Filled 2018-09-11: qty 250

## 2018-09-11 MED ORDER — SODIUM CHLORIDE 0.9% FLUSH
10.0000 mL | INTRAVENOUS | Status: DC | PRN
  Administered 2018-09-11: 13:00:00 10 mL
  Filled 2018-09-11: qty 10

## 2018-09-11 MED ORDER — SODIUM CHLORIDE 0.9% FLUSH
10.0000 mL | Freq: Once | INTRAVENOUS | Status: AC
  Administered 2018-09-11: 10 mL
  Filled 2018-09-11: qty 10

## 2018-09-11 MED ORDER — SODIUM CHLORIDE 0.9 % IV SOLN
240.0000 mg | Freq: Once | INTRAVENOUS | Status: AC
  Administered 2018-09-11: 240 mg via INTRAVENOUS
  Filled 2018-09-11: qty 24

## 2018-09-11 MED ORDER — DENOSUMAB 120 MG/1.7ML ~~LOC~~ SOLN
120.0000 mg | Freq: Once | SUBCUTANEOUS | Status: AC
  Administered 2018-09-11: 120 mg via SUBCUTANEOUS

## 2018-09-11 MED ORDER — DENOSUMAB 120 MG/1.7ML ~~LOC~~ SOLN
SUBCUTANEOUS | Status: AC
  Filled 2018-09-11: qty 1.7

## 2018-09-11 MED ORDER — HEPARIN SOD (PORK) LOCK FLUSH 100 UNIT/ML IV SOLN
500.0000 [IU] | Freq: Once | INTRAVENOUS | Status: AC | PRN
  Administered 2018-09-11: 13:00:00 500 [IU]
  Filled 2018-09-11: qty 5

## 2018-09-11 NOTE — Patient Instructions (Signed)
Cancer Center Discharge Instructions for Patients Receiving Chemotherapy  Today you received the following chemotherapy agents Opdivo  To help prevent nausea and vomiting after your treatment, we encourage you to take your nausea medication as directed   If you develop nausea and vomiting that is not controlled by your nausea medication, call the clinic.   BELOW ARE SYMPTOMS THAT SHOULD BE REPORTED IMMEDIATELY:  *FEVER GREATER THAN 100.5 F  *CHILLS WITH OR WITHOUT FEVER  NAUSEA AND VOMITING THAT IS NOT CONTROLLED WITH YOUR NAUSEA MEDICATION  *UNUSUAL SHORTNESS OF BREATH  *UNUSUAL BRUISING OR BLEEDING  TENDERNESS IN MOUTH AND THROAT WITH OR WITHOUT PRESENCE OF ULCERS  *URINARY PROBLEMS  *BOWEL PROBLEMS  UNUSUAL RASH Items with * indicate a potential emergency and should be followed up as soon as possible.  Feel free to call the clinic should you have any questions or concerns. The clinic phone number is [phone].  Please show the CHEMO ALERT CARD at check-in to the Emergency Department and triage nurse.   

## 2018-09-15 ENCOUNTER — Telehealth: Payer: Self-pay | Admitting: Hematology

## 2018-09-15 NOTE — Telephone Encounter (Signed)
Scheduled appt per 5/22 los.  Tried contacting patient to let the patient know I cancelled the f/u for 6/5 and adjusted her lab appt. Was not able to reach the patient.

## 2018-09-17 ENCOUNTER — Telehealth: Payer: Self-pay | Admitting: Gastroenterology

## 2018-09-17 NOTE — Telephone Encounter (Signed)
Pt has been experiencing abd pain since the past two days despite taking meds. Pls call her.

## 2018-09-17 NOTE — Telephone Encounter (Signed)
Patient called and says she has been having bad almost constant pain in her abdomen for about 1 week. It is in the epi-gastric area and feels similar to the pain she was having before her Lap. Chole. She is  not constipated, no n/v, no fever. She has been taking her Prilosec BID. Please advise

## 2018-09-17 NOTE — Telephone Encounter (Signed)
Patient called back. I spoke with the patient. She had been feeling somewhat better while she was recovering from her laparoscopic cholecystectomy. However pain has recurred over the last week and really come back to where things were before. She has some mild abdominal distention. No issues from her surgical sites per her report. She denies any fevers or chills. She denies darkened urine or jaundice. She denies pruritus. At this point in time I think to ensure that we are not missing a postsurgical issue, which I think is unlikely but needs to be exonerated, she will need a CT scan. I had only now remembered that she has a single kidney and so she does not use contrasted studies including CT or MRI. I am okay with the patient having a CT abdomen/pelvis with oral contrast but no IV contrast. This will to look to evaluate for postsurgical fluid collections, evaluate for pancreatitis or duodenal ulcer disease. I think she should be increased on her PPI as well to 40 mg twice daily now. CT will also be able to evaluate the stents and ensure that the bile duct is not significantly dilated or that there is an issue with the previously placed stents. Laboratories to be drawn as noted above on prior discussion. As previously stated I will be away however if the results of the imaging studies are performed and the laboratories are performed before the end of the weekend the [place] of the day or on-call provider can reach out to the patient if there are urgent issues that are found.  Otherwise we will try to get her back into clinic in the next few weeks.  Justice Britain, MD [facility] Office # [phone]

## 2018-09-17 NOTE — Telephone Encounter (Signed)
Thank you for reaching out to me. I am sorry that she is having this recurrent discomfort. We had hoped that the cholecystectomy would be enough to help with what was felt to be symptomatic gallbladder disease. She had an extensive work-up. I tried calling her around 54 and I was not successful I left a message. I will try and reach out to her before I leave the office. Unfortunately, I will be away until the early part of next week. I would consider a CBC/CMP/direct bilirubin/amylase/lipase to be performed. She should have a KUB performed or if the discomfort is so significant then she likely will need a cross-sectional CT of the abdomen/pelvis performed with IV contrast.  This would be to evaluate for postsurgical changes in the right upper quadrant and also to evaluate the pancreas and evaluate the bile duct with the previously placed stents. If there is availability with 1 of my partners to do a telephone visit as an urgent visit on Friday that may be worthwhile otherwise I think she should undergo some laboratory testing and imaging as noted above. If she is in more significant pain and cannot tolerate things that she needs to reach out to her primary or to Dr. Marlowe Aschoff office or come into the ED for further evaluation. I will try and reach her again before I leave.  Justice Britain, MD [facility] Office # [phone]

## 2018-09-18 ENCOUNTER — Encounter (HOSPITAL_COMMUNITY): Payer: Self-pay

## 2018-09-18 ENCOUNTER — Telehealth: Payer: Self-pay | Admitting: Gastroenterology

## 2018-09-18 ENCOUNTER — Other Ambulatory Visit: Payer: Self-pay

## 2018-09-18 ENCOUNTER — Telehealth: Payer: Self-pay

## 2018-09-18 ENCOUNTER — Other Ambulatory Visit: Payer: Self-pay | Admitting: Gastroenterology

## 2018-09-18 ENCOUNTER — Ambulatory Visit (HOSPITAL_COMMUNITY)
Admission: RE | Admit: 2018-09-18 | Discharge: 2018-09-18 | Disposition: A | Discharge status: Home / Self Care | Payer: Medicare Other | Source: Ambulatory Visit | Attending: Gastroenterology | Admitting: Gastroenterology

## 2018-09-18 ENCOUNTER — Other Ambulatory Visit (INDEPENDENT_AMBULATORY_CARE_PROVIDER_SITE_OTHER): Payer: Medicare Other

## 2018-09-18 DIAGNOSIS — R109 Unspecified abdominal pain: Secondary | ICD-10-CM

## 2018-09-18 LAB — CBC WITH DIFFERENTIAL/PLATELET
Basophils Absolute: 0 10*3/uL (ref 0.0–0.1)
Basophils Relative: 0.4 % (ref 0.0–3.0)
Eosinophils Absolute: 0.2 10*3/uL (ref 0.0–0.7)
Eosinophils Relative: 2.9 % (ref 0.0–5.0)
HCT: 39.6 % (ref 36.0–46.0)
Hemoglobin: 13.3 g/dL (ref 12.0–15.0)
Lymphocytes Relative: 14.9 % (ref 12.0–46.0)
Lymphs Abs: 1.2 10*3/uL (ref 0.7–4.0)
MCHC: 33.6 g/dL (ref 30.0–36.0)
MCV: 90.1 fl (ref 78.0–100.0)
Monocytes Absolute: 0.9 10*3/uL (ref 0.1–1.0)
Monocytes Relative: 10.4 % (ref 3.0–12.0)
Neutro Abs: 5.9 10*3/uL (ref 1.4–7.7)
Neutrophils Relative %: 71.4 % (ref 43.0–77.0)
Platelets: 313 10*3/uL (ref 150.0–400.0)
RBC: 4.39 Mil/uL (ref 3.87–5.11)
RDW: 14 % (ref 11.5–15.5)
WBC: 8.3 10*3/uL (ref 4.0–10.5)

## 2018-09-18 LAB — COMPREHENSIVE METABOLIC PANEL
ALT: 63 U/L — ABNORMAL HIGH (ref 0–35)
AST: 28 U/L (ref 0–37)
Albumin: 4 g/dL (ref 3.5–5.2)
Alkaline Phosphatase: 125 U/L — ABNORMAL HIGH (ref 39–117)
BUN: 18 mg/dL (ref 6–23)
CO2: 25 mEq/L (ref 19–32)
Calcium: 9.8 mg/dL (ref 8.4–10.5)
Chloride: 98 mEq/L (ref 96–112)
Creatinine, Ser: 1.42 mg/dL — ABNORMAL HIGH (ref 0.40–1.20)
GFR: 35.92 mL/min — ABNORMAL LOW (ref >60.00)
Glucose, Bld: 172 mg/dL — ABNORMAL HIGH (ref 70–99)
Potassium: 4.7 mEq/L (ref 3.5–5.1)
Sodium: 133 mEq/L — ABNORMAL LOW (ref 135–145)
Total Bilirubin: 0.5 mg/dL (ref 0.2–1.2)
Total Protein: 7 g/dL (ref 6.0–8.3)

## 2018-09-18 LAB — AMYLASE: Amylase: 93 U/L (ref 27–131)

## 2018-09-18 LAB — LIPASE: Lipase: 119 U/L — ABNORMAL HIGH (ref 11.0–59.0)

## 2018-09-18 LAB — BILIRUBIN, DIRECT: Bilirubin, Direct: 0.1 mg/dL (ref 0.0–0.3)

## 2018-09-18 MED ORDER — IOHEXOL 300 MG/ML  SOLN
30.0000 mL | Freq: Once | INTRAMUSCULAR | Status: AC | PRN
  Administered 2018-09-18: 12:00:00 30 mL via ORAL

## 2018-09-18 NOTE — Telephone Encounter (Signed)
Called radiology, they had already changed order to without

## 2018-09-18 NOTE — Telephone Encounter (Signed)
See phone note

## 2018-09-18 NOTE — Telephone Encounter (Signed)
[facility] calling regarding Cy abd pelvis with contrast order that was sent. They stated that order needs to be "without contrast." Pt is about to have the exam at this moment so they are requesting new order asap.

## 2018-09-18 NOTE — Telephone Encounter (Signed)
Labs in [place]. Stat CT abd/pelvis with oral contrast only scheduled at [facility] today, patient to arrive at 12:00 to drink contrast with CT at 2:30pm. Staff message to [facility] ref. Procedure. Patient called and notified of all the above. Also to be  NPO(solids) till after CT. Patient is coming into our lab before CT.

## 2018-09-22 ENCOUNTER — Emergency Department (HOSPITAL_COMMUNITY)
Admission: EM | Admit: 2018-09-22 | Discharge: 2018-09-23 | Disposition: A | Discharge status: Home / Self Care | Payer: Medicare Other | Attending: Emergency Medicine | Admitting: Emergency Medicine

## 2018-09-22 ENCOUNTER — Other Ambulatory Visit: Payer: Self-pay

## 2018-09-22 DIAGNOSIS — R1030 Lower abdominal pain, unspecified: Secondary | ICD-10-CM | POA: Diagnosis not present

## 2018-09-22 DIAGNOSIS — I252 Old myocardial infarction: Secondary | ICD-10-CM | POA: Diagnosis not present

## 2018-09-22 DIAGNOSIS — Z79899 Other long term (current) drug therapy: Secondary | ICD-10-CM | POA: Diagnosis not present

## 2018-09-22 DIAGNOSIS — Z85528 Personal history of other malignant neoplasm of kidney: Secondary | ICD-10-CM | POA: Insufficient documentation

## 2018-09-22 DIAGNOSIS — Z87891 Personal history of nicotine dependence: Secondary | ICD-10-CM | POA: Diagnosis not present

## 2018-09-22 DIAGNOSIS — I10 Essential (primary) hypertension: Secondary | ICD-10-CM | POA: Diagnosis not present

## 2018-09-22 DIAGNOSIS — K5904 Chronic idiopathic constipation: Secondary | ICD-10-CM | POA: Insufficient documentation

## 2018-09-22 DIAGNOSIS — E119 Type 2 diabetes mellitus without complications: Secondary | ICD-10-CM | POA: Insufficient documentation

## 2018-09-22 DIAGNOSIS — R1033 Periumbilical pain: Secondary | ICD-10-CM | POA: Diagnosis present

## 2018-09-22 DIAGNOSIS — R109 Unspecified abdominal pain: Secondary | ICD-10-CM

## 2018-09-22 LAB — URINALYSIS, ROUTINE W REFLEX MICROSCOPIC
Bilirubin Urine: NEGATIVE
Glucose, UA: NEGATIVE mg/dL
Hgb urine dipstick: NEGATIVE
Ketones, ur: NEGATIVE mg/dL
Nitrite: NEGATIVE
Protein, ur: NEGATIVE mg/dL
Specific Gravity, Urine: 1.015 (ref 1.005–1.030)
pH: 5 (ref 5.0–8.0)

## 2018-09-22 LAB — CBC
HCT: 36.7 % (ref 36.0–46.0)
Hemoglobin: 11.7 g/dL — ABNORMAL LOW (ref 12.0–15.0)
MCH: 29.8 pg (ref 26.0–34.0)
MCHC: 31.9 g/dL (ref 30.0–36.0)
MCV: 93.6 fL (ref 80.0–100.0)
Platelets: 281 10*3/uL (ref 150–400)
RBC: 3.92 MIL/uL (ref 3.87–5.11)
RDW: 13.1 % (ref 11.5–15.5)
WBC: 7.7 10*3/uL (ref 4.0–10.5)
nRBC: 0 % (ref 0.0–0.2)

## 2018-09-22 LAB — COMPREHENSIVE METABOLIC PANEL
ALT: 29 U/L (ref 0–44)
AST: 21 U/L (ref 15–41)
Albumin: 3.6 g/dL (ref 3.5–5.0)
Alkaline Phosphatase: 103 U/L (ref 38–126)
Anion gap: 11 (ref 5–15)
BUN: 20 mg/dL (ref 8–23)
CO2: 21 mmol/L — ABNORMAL LOW (ref 22–32)
Calcium: 9.1 mg/dL (ref 8.9–10.3)
Chloride: 103 mmol/L (ref 98–111)
Creatinine, Ser: 1.62 mg/dL — ABNORMAL HIGH (ref 0.44–1.00)
GFR calc Af Amer: 35 mL/min — ABNORMAL LOW (ref >60)
GFR calc non Af Amer: 31 mL/min — ABNORMAL LOW (ref >60)
Glucose, Bld: 176 mg/dL — ABNORMAL HIGH (ref 70–99)
Potassium: 4.4 mmol/L (ref 3.5–5.1)
Sodium: 135 mmol/L (ref 135–145)
Total Bilirubin: 0.5 mg/dL (ref 0.3–1.2)
Total Protein: 6 g/dL — ABNORMAL LOW (ref 6.5–8.1)

## 2018-09-22 LAB — LIPASE, BLOOD: Lipase: 49 U/L (ref 11–51)

## 2018-09-22 NOTE — ED Triage Notes (Signed)
Pt endorses mid Left lower abd pain x 1 month. Was here last month and had gallbladder removed, pt has continued to have pain. "I think it's my pancreas" Pt has been taking oxycodone without relief. VSS. Hx of renal cancer and on immunotherapy.

## 2018-09-23 ENCOUNTER — Other Ambulatory Visit: Payer: Self-pay

## 2018-09-23 ENCOUNTER — Emergency Department (HOSPITAL_COMMUNITY): Payer: Medicare Other

## 2018-09-23 DIAGNOSIS — R1033 Periumbilical pain: Secondary | ICD-10-CM | POA: Diagnosis not present

## 2018-09-23 MED ORDER — OXYCODONE-ACETAMINOPHEN 5-325 MG PO TABS
1.0000 | ORAL_TABLET | Freq: Once | ORAL | Status: AC
  Administered 2018-09-23: 1 via ORAL
  Filled 2018-09-23: qty 1

## 2018-09-23 MED ORDER — POLYETHYLENE GLYCOL 3350 17 GM/SCOOP PO POWD: ORAL | 0 refills | Status: DC

## 2018-09-23 NOTE — ED Notes (Signed)
Patient transported to X-ray 

## 2018-09-23 NOTE — ED Provider Notes (Signed)
[facility] EMERGENCY DEPARTMENT Provider Note  CSN: [id number] Arrival date & time: 09/22/18 1749  Chief Complaint(s) Abdominal Pain  HPI Audrey [name] is a 78 y.o. female with a past medical history listed below including recent laparoscopic cholecystectomy on May 13 for acute cholecystitis who presents to the emergency department with persistent abdominal pain that is been ongoing since surgery.  Patient reached out to gastroenterology on 5/28 and had a CT scan on 5/29 which did not reveal any postoperative infection or bowel obstruction.  Patient reports that the pain has not improved.  She is currently taking 5 mg of oxycodone.  States that she spoke with Dr. Barry Dienes earlier today and was allowed to take to oxycodones which did provide some relief.  She was also contacted regarding elevated lipase greater than 500 and instructed to come to the emergency department for evaluation.  She reports that her abdominal pain is periumbilical and lower.  Fluctuating in nature.  No other alleviating or aggravating factors.  Patient does report lack of bowel movement in the past 2 days.  She does endorse a history of constipation and rectal prolapse which complicates her having bowel movements.  She denies any nausea or vomiting.  No fevers or chills.  No other physical complaints.  HPI  Past Medical History Past Medical History:  Diagnosis Date  . Arthritis    lower back, hips, hands  . Diabetes mellitus ([place])    type 2 - no meds, diet controlled  . Early cataracts, bilateral   . Elevated liver enzymes   . GERD (gastroesophageal reflux disease)    occasional - diet controlled  . History of hiatal hernia   . HTN (hypertension)   . Hyperlipidemia   . Hypothyroidism   . left renal ca dx'd 2018   renal cancer - left kidney removed, pill chemo x 1 yr  . Myocardial infarction ([place]) 1991   no deficits  . SVD (spontaneous vaginal delivery)    x 3  . Wears glasses    Patient  Active Problem List   Diagnosis Date Noted  . 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 Acute cholecystitis 08/29/2018  . Chronic constipation 08/23/2018  . Abdominal pain 08/23/2018  . 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 Preop cardiovascular exam 08/14/2018  . Coronary artery disease involving native coronary artery of native heart without angina pectoris 08/14/2018  . Dyslipidemia 08/14/2018  . Elevated LFTs 08/05/2018  . Choledocholithiasis 08/05/2018  . Dilated bile duct 08/05/2018  . History of ERCP 08/05/2018  . History of renal cell carcinoma 08/05/2018  . Port-A-Cath in place 01/30/2018  . Counseling regarding advance care planning and goals of care 01/04/2018  . Renal cell carcinoma ([place]) 04/11/2017  . 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 Left renal mass 08/01/2016   Home Medication(s) Prior to Admission medications   Medication Sig Start Date End Date Taking? Authorizing Provider  amLODipine (NORVASC) 5 MG tablet Take 5 mg by mouth daily.  09/01/17  Yes [provider]  atorvastatin (LIPITOR) 40 MG tablet Take 40 mg by mouth at bedtime.    Yes [provider]  bisacodyl (DULCOLAX) 5 MG EC tablet Take 5 mg by mouth at bedtime.   Yes [provider]  Calcium 600-200 MG-UNIT tablet Take 1 tablet by mouth daily after breakfast.    Yes [provider]  carvedilol (COREG) 6.25 MG tablet Take 6.25 mg by mouth 2 (two) times daily. 09/01/17  Yes [provider]  clobetasol  ointment (TEMOVATE) 4.00 % Apply 1 application topically 2 (two) times daily as needed (lichen sclerosis).    Yes [provider]  docusate sodium (COLACE) 100 MG capsule Take 100 mg by mouth at bedtime.   Yes [provider]  hydrocortisone (CORTEF) 10 MG tablet Take 5-10 mg by mouth See admin instructions. Take 1 tablet (10 mg) by mouth in the morning & 1/2 tablet (5 mg) by mouth in the evening.   Yes [provider]  levothyroxine (SYNTHROID)  75 MCG tablet Take 75 mcg by mouth daily before breakfast.    Yes [provider]  lidocaine-prilocaine (EMLA) cream Apply to affected area once Patient taking differently: Apply 1 application topically daily as needed (prior to port access).  12/26/17  Yes Brunetta Genera, MD  Nivolumab (OPDIVO IV) Inject 1 Dose into the vein every 14 (fourteen) days. [place]   Yes [provider]  omeprazole (PRILOSEC) 20 MG capsule Take 1 capsule (20 mg total) by mouth 2 (two) times daily. 07/23/18 07/23/19 Yes Mansouraty, Telford Nab., MD  oxyCODONE (OXY IR/ROXICODONE) 5 MG immediate release tablet Take 1 tablet (5 mg total) by mouth every 6 (six) hours as needed for severe pain. Patient taking differently: Take 10 mg by mouth every 6 (six) hours as needed for severe pain.  09/04/18  Yes Meuth, Brooke A, PA-C  repaglinide (PRANDIN) 0.5 MG tablet Take 0.5 mg by mouth 2 (two) times daily before a meal.   Yes [provider]  acetaminophen (TYLENOL) 325 MG tablet Take 2 tablets (650 mg total) by mouth every 6 (six) hours as needed for mild pain. Patient not taking: Reported on 09/23/2018 09/04/18   Margie Billet A, PA-C  polyethylene glycol powder (MIRALAX) 17 GM/SCOOP powder Start taking 1 capful 3 times a day. Slowly cut back as needed until you have normal bowel movements. 09/23/18   Fatima Blank, MD                                                                                                                                    Past Surgical History Past Surgical History:  Procedure Laterality Date  . BALLOON DILATION N/A 07/23/2018   Procedure: BALLOON DILATION;  Surgeon: Rush Landmark Telford Nab., MD;  Location: [facility];  Service: Gastroenterology;  Laterality: N/A;  . BILIARY BRUSHING  08/10/2018   Procedure: BILIARY BRUSHING;  Surgeon: Rush Landmark Telford Nab., MD;  Location: [facility];  Service: Gastroenterology;;  . BILIARY DILATION  08/10/2018   Procedure:  BILIARY DILATION;  Surgeon: Irving Copas., MD;  Location: [facility];  Service: Gastroenterology;;  . BILIARY DILATION  08/27/2018   Procedure: BILIARY DILATION;  Surgeon: Irving Copas., MD;  Location: [facility];  Service: Gastroenterology;;  . BILIARY STENT PLACEMENT  08/10/2018   Procedure: BILIARY STENT PLACEMENT;  Surgeon: Irving Copas., MD;  Location: [facility];  Service: Gastroenterology;;  . BILIARY STENT  PLACEMENT  08/27/2018   Procedure: BILIARY STENT PLACEMENT;  Surgeon: Irving Copas., MD;  Location: [facility];  Service: Gastroenterology;;  . BIOPSY  07/23/2018   Procedure: BIOPSY;  Surgeon: Irving Copas., MD;  Location: [facility];  Service: Gastroenterology;;  . CHOLECYSTECTOMY N/A 09/02/2018   Procedure: LAPAROSCOPIC CHOLECYSTECTOMY;  Surgeon: Stark Klein, MD;  Location: [facility];  Service: General;  Laterality: N/A;  . COLONOSCOPY     normal   . ENDOSCOPIC RETROGRADE CHOLANGIOPANCREATOGRAPHY (ERCP) WITH PROPOFOL N/A 08/10/2018   Procedure: ENDOSCOPIC RETROGRADE CHOLANGIOPANCREATOGRAPHY (ERCP) WITH PROPOFOL;  Surgeon: Irving Copas., MD;  Location: [facility];  Service: Gastroenterology;  Laterality: N/A;  . ERCP N/A 07/23/2018   Procedure: ENDOSCOPIC RETROGRADE CHOLANGIOPANCREATOGRAPHY (ERCP);  Surgeon: Irving Copas., MD;  Location: [facility];  Service: Gastroenterology;  Laterality: N/A;  . ERCP N/A 08/27/2018   Procedure: ENDOSCOPIC RETROGRADE CHOLANGIOPANCREATOGRAPHY (ERCP);  Surgeon: Irving Copas., MD;  Location: [facility];  Service: Gastroenterology;  Laterality: N/A;  . ESOPHAGOGASTRODUODENOSCOPY (EGD) WITH PROPOFOL N/A 08/27/2018   Procedure: ESOPHAGOGASTRODUODENOSCOPY (EGD) WITH PROPOFOL;  Surgeon: Rush Landmark Telford Nab., MD;  Location: [facility];  Service: Gastroenterology;  Laterality: N/A;  . EUS N/A 08/27/2018   Procedure: ESOPHAGEAL ENDOSCOPIC ULTRASOUND (EUS) RADIAL;  Surgeon:  Rush Landmark Telford Nab., MD;  Location: [facility];  Service: Gastroenterology;  Laterality: N/A;  . FINE NEEDLE ASPIRATION  08/27/2018   Procedure: FINE NEEDLE ASPIRATION (FNA) LINEAR;  Surgeon: Irving Copas., MD;  Location: [facility];  Service: Gastroenterology;;  . IR IMAGING GUIDED PORT INSERTION  01/08/2018  . LAPAROSCOPIC NEPHRECTOMY Left 08/01/2016   Procedure: LAPAROSCOPIC  RADICAL NEPHRECTOMY/ REPAIR OF UMBILICAL HERNIA;  Surgeon: Raynelle Bring, MD;  Location: [facility];  Service: Urology;  Laterality: Left;  . REMOVAL OF STONES  07/23/2018   Procedure: REMOVAL OF GALL STONES;  Surgeon: Rush Landmark Telford Nab., MD;  Location: [facility];  Service: Gastroenterology;;  . REMOVAL OF STONES  08/10/2018   Procedure: REMOVAL OF STONES;  Surgeon: Irving Copas., MD;  Location: [facility];  Service: Gastroenterology;;  . REMOVAL OF STONES  08/27/2018   Procedure: REMOVAL OF STONES;  Surgeon: Irving Copas., MD;  Location: [facility];  Service: Gastroenterology;;  . [name]  07/23/2018   Procedure: [name];  Surgeon: Irving Copas., MD;  Location: [facility];  Service: Gastroenterology;;  . Lavell Islam REMOVAL  08/27/2018   Procedure: STENT REMOVAL;  Surgeon: Irving Copas., MD;  Location: [facility];  Service: Gastroenterology;;  . UPPER GI ENDOSCOPY     x 1   Family History Family History  Problem Relation Age of Onset  . Heart attack Father 16  . Heart disease Brother 18       CABG  . Colon cancer Neg Hx   . Esophageal cancer Neg Hx   . Inflammatory bowel disease Neg Hx   . Liver disease Neg Hx   . Pancreatic cancer Neg Hx   . Rectal cancer Neg Hx   . Stomach cancer Neg Hx     Social History Social History   Tobacco Use  . Smoking status: Former Smoker    Packs/day: 1.00    Years: 8.00    Pack years: 8.00    Types: Cigarettes    Last attempt to quit: 06/18/1964    Years since quitting: 54.3  . Smokeless tobacco: Never  Used  Substance Use Topics  . Alcohol use: No  . Drug use: No   Allergies Adhesive [tape]; Gabapentin; Nsaids; and Statins  Review of Systems Review of Systems All other systems are reviewed and are negative for acute change except as noted in the HPI  Physical Exam Vital Signs  I have reviewed the triage vital signs BP 115/74   Pulse (!) 50   Temp 98.2 F (36.8 C) (Oral)   Resp 18   Ht 4\' 11"  (1.499 m)   Wt 58.1 kg   LMP  (LMP Unknown)   SpO2 100%   BMI 25.85 kg/m   Physical Exam Vitals signs reviewed.  Constitutional:      General: She is not in acute distress.    Appearance: She is well-developed. She is not diaphoretic.  HENT:     Head: Normocephalic and atraumatic.     Right Ear: External ear normal.     Left Ear: External ear normal.     Nose: Nose normal.  Eyes:     General: No scleral icterus.    Conjunctiva/sclera: Conjunctivae normal.  Neck:     Musculoskeletal: Normal range of motion.     Trachea: Phonation normal.  Cardiovascular:     Rate and Rhythm: Normal rate and regular rhythm.  Pulmonary:     Effort: Pulmonary effort is normal. No respiratory distress.     Breath sounds: No stridor.  Abdominal:     General: There is no distension.     Palpations: Abdomen is soft.     Tenderness: There is no abdominal tenderness. There is no guarding or rebound.     Hernia: No hernia is present.     Comments: Trocar incisions are well-healed, well-appearing, clean dry and without erythema or discharge.    Musculoskeletal: Normal range of motion.  Neurological:     Mental Status: She is alert and oriented to person, place, and time.  Psychiatric:        Behavior: Behavior normal.     ED Results and Treatments Labs (all labs ordered are listed, but only abnormal results are displayed) Labs Reviewed  COMPREHENSIVE METABOLIC PANEL - Abnormal; Notable for the following components:      Result Value   CO2 21 (*)    Glucose, Bld 176 (*)    Creatinine,  Ser 1.62 (*)    Total Protein 6.0 (*)    GFR calc non Af Amer 31 (*)    GFR calc Af Amer 35 (*)    All other components within normal limits  CBC - Abnormal; Notable for the following components:   Hemoglobin 11.7 (*)    All other components within normal limits  URINALYSIS, ROUTINE W REFLEX MICROSCOPIC - Abnormal; Notable for the following components:   Leukocytes,Ua SMALL (*)    Bacteria, UA RARE (*)    All other components within normal limits  URINE CULTURE  LIPASE, BLOOD                                                                                                                         EKG  EKG Interpretation  Date/Time:    Ventricular Rate:    PR Interval:    QRS Duration:   QT Interval:    QTC Calculation:   R Axis:     Text Interpretation:        Radiology Dg Abd Acute 2+v W 1v Chest  Result Date: 09/23/2018 CLINICAL DATA:  Abdominal discomfort. EXAM: DG ABDOMEN ACUTE W/ 1V CHEST COMPARISON:  08/30/2018.  09/18/2018. FINDINGS: There is a well-positioned right-sided Port-A-Cath in place. The lungs are essentially clear with no evidence of a pneumothorax or large pleural effusion. There is no acute osseous abnormality detected. There is a moderate to large amount of stool throughout the colon. Vascular calcifications are noted. Plastic biliary stents are again noted. Or there is a tubular contrast filled structure located in the patient's right hemipelvis which is favored to represent a contrast filled appendix. IMPRESSION: 1. No acute cardiopulmonary process. 2. Nonobstructive bowel gas pattern. 3. Moderate amount of stool throughout the colon. 4. Plastic biliary stents are again noted. Electronically Signed   By: Constance Holster M.D.   On: 09/23/2018 02:37   Pertinent labs & imaging results that were available during my care of the patient were reviewed by me and considered in my medical decision making (see chart for details).  Medications Ordered in ED Medications   oxyCODONE-acetaminophen (PERCOCET/ROXICET) 5-325 MG per tablet 1 tablet (1 tablet Oral Given 09/23/18 0210)                                                                                                                                    Procedures Procedures  (including critical care time)  Medical Decision Making / ED Course I have reviewed the nursing notes for this encounter and the patient's prior records (if available in EHR or on provided paperwork).    Patient is here with consistent abdominal pain despite surgery.  No acute changes with regards to the pain.  Here given the instructions by on-call nurse to assess for pancreatitis.  Patient is complaining of abdominal pain but her abdominal exam is benign.  Labs are grossly reassuring without leukocytosis or significant anemia.  No significant electrolyte derangements or worsening renal sufficiency.  No evidence of bili obstruction or pancreatitis.  Acute abdominal series without obstructive pattern.  It did reveal moderate amount of colonic stool burden.  I have low suspicion for serious intra-abdominal laboratory/infectious process or bowel obstruction at this time.  Patient is able to tolerate oral intake.  Provided with oral pain medicine which did provide some relief.  The patient appears reasonably screened and/or stabilized for discharge and I doubt any other medical condition or other [facility] requiring further screening, evaluation, or treatment in the ED at this time prior to discharge.  The patient is safe for discharge with strict return precautions.   Final Clinical Impression(s) / ED Diagnoses Final diagnoses:  Abdominal discomfort  Lower abdominal pain  Chronic idiopathic constipation  Disposition: Discharge  Condition: Good  I have discussed the results, Dx and Tx plan with the patient who expressed understanding and agree(s) with the plan. Discharge instructions discussed at great length. The patient was given strict  return precautions who verbalized understanding of the instructions. No further questions at time of discharge.    ED Discharge Orders         Ordered    polyethylene glycol powder (MIRALAX) 17 GM/SCOOP powder     09/23/18 0423           Follow Up: [name], MD [address], [address] [place] [address] [phone]  Schedule an appointment as soon as possible for a visit  As needed  Stark Klein, MD [address] [place] [address] [phone]  Schedule an appointment as soon as possible for a visit  As needed      This chart was dictated using voice recognition software.  Despite best efforts to proofread,  errors can occur which can change the documentation meaning.   Fatima Blank, MD 09/23/18 [phone]

## 2018-09-23 NOTE — Discharge Instructions (Addendum)

## 2018-09-25 ENCOUNTER — Ambulatory Visit: Payer: Medicare Other | Admitting: Hematology

## 2018-09-25 ENCOUNTER — Inpatient Hospital Stay: Payer: Medicare Other

## 2018-09-25 ENCOUNTER — Telehealth: Payer: Self-pay | Admitting: Gastroenterology

## 2018-09-25 ENCOUNTER — Other Ambulatory Visit: Payer: Medicare Other

## 2018-09-25 ENCOUNTER — Other Ambulatory Visit: Payer: Self-pay

## 2018-09-25 ENCOUNTER — Inpatient Hospital Stay: Payer: Medicare Other | Attending: Hematology

## 2018-09-25 VITALS — BP 127/57 | Temp 97.9°F | Resp 16

## 2018-09-25 DIAGNOSIS — E079 Disorder of thyroid, unspecified: Secondary | ICD-10-CM | POA: Insufficient documentation

## 2018-09-25 DIAGNOSIS — Z95828 Presence of other vascular implants and grafts: Secondary | ICD-10-CM

## 2018-09-25 DIAGNOSIS — E785 Hyperlipidemia, unspecified: Secondary | ICD-10-CM | POA: Insufficient documentation

## 2018-09-25 DIAGNOSIS — N8110 Cystocele, unspecified: Secondary | ICD-10-CM | POA: Diagnosis not present

## 2018-09-25 DIAGNOSIS — K449 Diaphragmatic hernia without obstruction or gangrene: Secondary | ICD-10-CM | POA: Insufficient documentation

## 2018-09-25 DIAGNOSIS — E538 Deficiency of other specified B group vitamins: Secondary | ICD-10-CM | POA: Diagnosis not present

## 2018-09-25 DIAGNOSIS — K649 Unspecified hemorrhoids: Secondary | ICD-10-CM | POA: Insufficient documentation

## 2018-09-25 DIAGNOSIS — M545 Low back pain: Secondary | ICD-10-CM | POA: Diagnosis not present

## 2018-09-25 DIAGNOSIS — C7951 Secondary malignant neoplasm of bone: Secondary | ICD-10-CM

## 2018-09-25 DIAGNOSIS — I10 Essential (primary) hypertension: Secondary | ICD-10-CM | POA: Diagnosis not present

## 2018-09-25 DIAGNOSIS — E119 Type 2 diabetes mellitus without complications: Secondary | ICD-10-CM | POA: Diagnosis not present

## 2018-09-25 DIAGNOSIS — M1990 Unspecified osteoarthritis, unspecified site: Secondary | ICD-10-CM | POA: Diagnosis not present

## 2018-09-25 DIAGNOSIS — I700 Atherosclerosis of aorta: Secondary | ICD-10-CM | POA: Diagnosis not present

## 2018-09-25 DIAGNOSIS — E871 Hypo-osmolality and hyponatremia: Secondary | ICD-10-CM | POA: Insufficient documentation

## 2018-09-25 DIAGNOSIS — E875 Hyperkalemia: Secondary | ICD-10-CM | POA: Diagnosis not present

## 2018-09-25 DIAGNOSIS — C7800 Secondary malignant neoplasm of unspecified lung: Secondary | ICD-10-CM | POA: Insufficient documentation

## 2018-09-25 DIAGNOSIS — I2510 Atherosclerotic heart disease of native coronary artery without angina pectoris: Secondary | ICD-10-CM | POA: Insufficient documentation

## 2018-09-25 DIAGNOSIS — E039 Hypothyroidism, unspecified: Secondary | ICD-10-CM

## 2018-09-25 DIAGNOSIS — E860 Dehydration: Secondary | ICD-10-CM

## 2018-09-25 DIAGNOSIS — I252 Old myocardial infarction: Secondary | ICD-10-CM | POA: Diagnosis not present

## 2018-09-25 DIAGNOSIS — N289 Disorder of kidney and ureter, unspecified: Secondary | ICD-10-CM | POA: Diagnosis not present

## 2018-09-25 DIAGNOSIS — I340 Nonrheumatic mitral (valve) insufficiency: Secondary | ICD-10-CM | POA: Insufficient documentation

## 2018-09-25 DIAGNOSIS — Z5112 Encounter for antineoplastic immunotherapy: Secondary | ICD-10-CM | POA: Insufficient documentation

## 2018-09-25 DIAGNOSIS — C642 Malignant neoplasm of left kidney, except renal pelvis: Secondary | ICD-10-CM | POA: Insufficient documentation

## 2018-09-25 DIAGNOSIS — Z87891 Personal history of nicotine dependence: Secondary | ICD-10-CM | POA: Insufficient documentation

## 2018-09-25 DIAGNOSIS — R110 Nausea: Secondary | ICD-10-CM | POA: Diagnosis not present

## 2018-09-25 DIAGNOSIS — K623 Rectal prolapse: Secondary | ICD-10-CM | POA: Insufficient documentation

## 2018-09-25 LAB — CBC WITH DIFFERENTIAL/PLATELET
Abs Immature Granulocytes: 0.08 10*3/uL — ABNORMAL HIGH (ref 0.00–0.07)
Basophils Absolute: 0.1 10*3/uL (ref 0.0–0.1)
Basophils Relative: 1 %
Eosinophils Absolute: 0.2 10*3/uL (ref 0.0–0.5)
Eosinophils Relative: 2 %
HCT: 36.2 % (ref 36.0–46.0)
Hemoglobin: 11.4 g/dL — ABNORMAL LOW (ref 12.0–15.0)
Immature Granulocytes: 1 %
Lymphocytes Relative: 13 %
Lymphs Abs: 1.1 10*3/uL (ref 0.7–4.0)
MCH: 29.8 pg (ref 26.0–34.0)
MCHC: 31.5 g/dL (ref 30.0–36.0)
MCV: 94.8 fL (ref 80.0–100.0)
Monocytes Absolute: 1.1 10*3/uL — ABNORMAL HIGH (ref 0.1–1.0)
Monocytes Relative: 13 %
Neutro Abs: 6.1 10*3/uL (ref 1.7–7.7)
Neutrophils Relative %: 70 %
Platelets: 260 10*3/uL (ref 150–400)
RBC: 3.82 MIL/uL — ABNORMAL LOW (ref 3.87–5.11)
RDW: 13.2 % (ref 11.5–15.5)
WBC: 8.7 10*3/uL (ref 4.0–10.5)
nRBC: 0 % (ref 0.0–0.2)

## 2018-09-25 LAB — CMP (CANCER CENTER ONLY)
ALT: 230 U/L — ABNORMAL HIGH (ref 0–44)
AST: 122 U/L — ABNORMAL HIGH (ref 15–41)
Albumin: 3.5 g/dL (ref 3.5–5.0)
Alkaline Phosphatase: 145 U/L — ABNORMAL HIGH (ref 38–126)
Anion gap: 7 (ref 5–15)
BUN: 17 mg/dL (ref 8–23)
CO2: 26 mmol/L (ref 22–32)
Calcium: 8.5 mg/dL — ABNORMAL LOW (ref 8.9–10.3)
Chloride: 103 mmol/L (ref 98–111)
Creatinine: 1.31 mg/dL — ABNORMAL HIGH (ref 0.44–1.00)
GFR, Est AFR Am: 46 mL/min — ABNORMAL LOW (ref >60)
GFR, Estimated: 39 mL/min — ABNORMAL LOW (ref >60)
Glucose, Bld: 153 mg/dL — ABNORMAL HIGH (ref 70–99)
Potassium: 4.7 mmol/L (ref 3.5–5.1)
Sodium: 136 mmol/L (ref 135–145)
Total Bilirubin: 0.3 mg/dL (ref 0.3–1.2)
Total Protein: 6.3 g/dL — ABNORMAL LOW (ref 6.5–8.1)

## 2018-09-25 LAB — URINE CULTURE: Culture: 60000 — AB

## 2018-09-25 LAB — TSH: TSH: 1.091 u[IU]/mL (ref 0.308–3.960)

## 2018-09-25 MED ORDER — SODIUM CHLORIDE 0.9% FLUSH
10.0000 mL | Freq: Once | INTRAVENOUS | Status: AC
  Administered 2018-09-25: 10 mL
  Filled 2018-09-25: qty 10

## 2018-09-25 MED ORDER — HEPARIN SOD (PORK) LOCK FLUSH 100 UNIT/ML IV SOLN
500.0000 [IU] | Freq: Once | INTRAVENOUS | Status: AC
  Administered 2018-09-25: 500 [IU]
  Filled 2018-09-25: qty 5

## 2018-09-25 NOTE — Telephone Encounter (Signed)
Pt reported that her she has elevated liver enzymes.  Please call back to discuss.

## 2018-09-25 NOTE — Telephone Encounter (Signed)
Placed a call to the pt x2 and the phone hung up each time.  Will try later

## 2018-09-25 NOTE — Patient Instructions (Signed)
[facility] Discharge Instructions for Patients Receiving Chemotherapy  Today you received the following chemotherapy agents Opdivo  To help prevent nausea and vomiting after your treatment, we encourage you to take your nausea medication as directed   If you develop nausea and vomiting that is not controlled by your nausea medication, call the clinic.   BELOW ARE SYMPTOMS THAT SHOULD BE REPORTED IMMEDIATELY:  *FEVER GREATER THAN 100.5 F  *CHILLS WITH OR WITHOUT FEVER  NAUSEA AND VOMITING THAT IS NOT CONTROLLED WITH YOUR NAUSEA MEDICATION  *UNUSUAL SHORTNESS OF BREATH  *UNUSUAL BRUISING OR BLEEDING  TENDERNESS IN MOUTH AND THROAT WITH OR WITHOUT PRESENCE OF ULCERS  *URINARY PROBLEMS  *BOWEL PROBLEMS  UNUSUAL RASH Items with * indicate a potential emergency and should be followed up as soon as possible.  Feel free to call the clinic should you have any questions or concerns. The clinic phone number is [phone].  Please show the CHEMO ALERT CARD at check-in to the Emergency Department and triage nurse.   

## 2018-09-25 NOTE — Telephone Encounter (Signed)
Dr Rush Landmark the pt called due to her elevated  liver enzymes.  See today's 09/25/18 lab result.  Please advise Dr Bryan Lemma can you please review for urgency?  Dr Rush Landmark is out of the office until Monday and I am not sure this is ok to wait.  Thank you

## 2018-09-25 NOTE — Telephone Encounter (Signed)
I called Ms. Halberstadt and reviewed her elevated liver enzymes.  LAE's were all normal 3 days ago, now with elevated AST/ALT/ALP (122/230/145 from 21/29/103 earlier in the week) on routine check prior to planned chemotherapy today.  Otherwise normal T bili.  Chemotherapy canceled due to elevated enzymes.  She states she otherwise feels in her usual state of health.  No nausea, vomiting, abdominal pain.  Does have abdominal discomfort attributed to her chronic constipation, but this is no different from her baseline.  Tolerating p.o. intake without issue.  No fever, chills.  Not sure the etiology for elevated liver enzymes. Did discuss the possibility of chemo-induced elevated enzymes.  While this is a common finding, would have expected at least some mild uptrend 3 days ago, although that is certainly not a necessity.  Nonetheless, she currently feels quite well and in the absence of additional symptoms or alarm features, do not feel that she needs to return to the ER for repeat labs and possible imaging study right now.  She is a very reliable patient, and we discussed ER return precautions.   Plan for the following:  - Repeat liver enzymes on Monday, 09/28/2018 - Discussed ER return precautions - All questions answered and she was appreciative for the phone call

## 2018-09-25 NOTE — Progress Notes (Signed)
Per [name], hold today's treatment due to elevated AST and ALT. Patient made to follow up with GI doctor per [name] and return to our clinic as scheduled in 2 weeks.

## 2018-09-28 ENCOUNTER — Other Ambulatory Visit (INDEPENDENT_AMBULATORY_CARE_PROVIDER_SITE_OTHER): Payer: Medicare Other

## 2018-09-28 ENCOUNTER — Other Ambulatory Visit: Payer: Self-pay

## 2018-09-28 DIAGNOSIS — R7989 Other specified abnormal findings of blood chemistry: Secondary | ICD-10-CM

## 2018-09-28 DIAGNOSIS — R945 Abnormal results of liver function studies: Secondary | ICD-10-CM

## 2018-09-28 LAB — HEPATIC FUNCTION PANEL
ALT: 63 U/L — ABNORMAL HIGH (ref 0–35)
AST: 19 U/L (ref 0–37)
Albumin: 4 g/dL (ref 3.5–5.2)
Alkaline Phosphatase: 111 U/L (ref 39–117)
Bilirubin, Direct: 0.1 mg/dL (ref 0.0–0.3)
Total Bilirubin: 0.4 mg/dL (ref 0.2–1.2)
Total Protein: 6.7 g/dL (ref 6.0–8.3)

## 2018-09-28 NOTE — Telephone Encounter (Signed)
Reminded pt about labs, order in epic.

## 2018-10-07 ENCOUNTER — Other Ambulatory Visit: Payer: Self-pay

## 2018-10-07 ENCOUNTER — Ambulatory Visit (INDEPENDENT_AMBULATORY_CARE_PROVIDER_SITE_OTHER): Payer: Medicare Other | Admitting: Gastroenterology

## 2018-10-07 VITALS — Ht 59.0 in | Wt 128.0 lb

## 2018-10-07 DIAGNOSIS — R7989 Other specified abnormal findings of blood chemistry: Secondary | ICD-10-CM

## 2018-10-07 DIAGNOSIS — K831 Obstruction of bile duct: Secondary | ICD-10-CM

## 2018-10-07 DIAGNOSIS — R945 Abnormal results of liver function studies: Secondary | ICD-10-CM

## 2018-10-07 DIAGNOSIS — R109 Unspecified abdominal pain: Secondary | ICD-10-CM

## 2018-10-07 DIAGNOSIS — K5900 Constipation, unspecified: Secondary | ICD-10-CM

## 2018-10-07 DIAGNOSIS — D649 Anemia, unspecified: Secondary | ICD-10-CM

## 2018-10-07 NOTE — Progress Notes (Signed)
[place] VISIT   Primary Care Provider [name], MD [address] [place] [address] [phone]  Patient Profile: Audrey [name] is a 78 y.o. 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  The patient presents to the [facility] for an evaluation and management of problem(s) noted below:  Problem List 1. Abnormal LFTs   2. Abdominal pain, unspecified abdominal location   3. Biliary stricture   4. Constipation, unspecified constipation type   5. Anemia, unspecified type     I connected with  Marcellina Millin on 10/07/18. I verified that I was speaking with the correct person using two identifiers. Due to the COVID-19 Pandemic, this service was provided via telemedicine using Audio-Only. Interactive audio and video telecommunications were attempted between this provider and patient, however failed and thus to provide timely and excellent care, we continued and completed visit with audio only. The patient was located at home. The provider was located in the office. The patient did consent to this visit and is aware of charges through their insurance as well as the limitations of evaluation and management by telemedicine. Other persons participating in this telemedicine service were none. Time spent on visit was 25 minutes on call and in coordination of care.  History of Present Illness: Please see prior consultation note/progress notes for full details of HPI.  Interval History The patient has done well over the course the last 10 days.  She is trying to minimize any intake of fatty foods.  She has not had recurrence of her abdominal pain.  She is planned for  chemotherapy later this week.  Bowels are moving on a daily basis 1-2 times per day.  She has stopped taking her MiraLAX.  She is happy with where things are in hopes that things continue to be in this state.  No nausea or vomiting.  No bleeding.   GI Review of Systems Positive as above Negative for odynophagia, bloating, change in bowel habits, melena, hematochezia  Review of Systems General: Denies fevers/chills Cardiovascular: Denies chest pain Pulmonary: Denies shortness of breath Gastroenterological: See HPI Genitourinary: Denies darkened urine Hematological: Denies easy bruising Dermatological: Denies jaundice Psychological: Mood is stable   Medications Current Outpatient Medications  Medication Sig Dispense Refill   acetaminophen (TYLENOL) 325 MG tablet Take 2 tablets (650 mg total) by mouth every 6 (six) hours as needed for mild pain. (Patient not taking: Reported on 09/23/2018)     amLODipine (NORVASC) 5 MG tablet Take 5 mg by mouth daily.   6   atorvastatin (LIPITOR) 40 MG tablet Take 40 mg by mouth at bedtime.      bisacodyl (DULCOLAX) 5 MG EC tablet Take 5 mg by mouth at bedtime.     Calcium 600-200 MG-UNIT tablet Take 1 tablet by mouth daily after breakfast.      carvedilol (COREG) 6.25 MG tablet Take 6.25 mg by mouth 2 (two) times daily.  6   clobetasol ointment (TEMOVATE) 9.48 % Apply 1 application topically 2 (two) times daily as needed (lichen sclerosis).      docusate sodium (COLACE) 100 MG capsule Take 100 mg by mouth at bedtime.     hydrocortisone (CORTEF) 10 MG tablet Take 5-10 mg by mouth See admin instructions. Take 1 tablet (10 mg)  by mouth in the morning & 1/2 tablet (5 mg) by mouth in the evening.     levothyroxine (SYNTHROID) 75 MCG tablet Take 75 mcg by mouth daily before breakfast.      lidocaine-prilocaine (EMLA) cream Apply to affected area once (Patient taking differently: Apply 1 application topically daily as needed (prior to port access). )  30 g 3   Nivolumab (OPDIVO IV) Inject 1 Dose into the vein every 14 (fourteen) days. [place]     omeprazole (PRILOSEC) 20 MG capsule Take 1 capsule (20 mg total) by mouth 2 (two) times daily. 60 capsule 2   oxyCODONE (OXY IR/ROXICODONE) 5 MG immediate release tablet Take 1 tablet (5 mg total) by mouth every 6 (six) hours as needed for severe pain. (Patient taking differently: Take 10 mg by mouth every 6 (six) hours as needed for severe pain. ) 15 tablet 0   polyethylene glycol powder (MIRALAX) 17 GM/SCOOP powder Start taking 1 capful 3 times a day. Slowly cut back as needed until you have normal bowel movements. 255 g 0   repaglinide (PRANDIN) 0.5 MG tablet Take 0.5 mg by mouth 2 (two) times daily before a meal.     No current facility-administered medications for this visit.     Allergies Allergies  Allergen Reactions   Adhesive [Tape] Other (See Comments)    Tears skin off Paper tape is ok per patient   Gabapentin Other (See Comments)    "Makes me loopy"   Nsaids Other (See Comments)    Liver damage   Statins Other (See Comments)    Liver/kidney issues.    Histories Past Medical History:  Diagnosis Date   Arthritis    lower back, hips, hands   Diabetes mellitus ([place])    type 2 - no meds, diet controlled   Early cataracts, bilateral    Elevated liver enzymes    GERD (gastroesophageal reflux disease)    occasional - diet controlled   History of hiatal hernia    HTN (hypertension)    Hyperlipidemia    Hypothyroidism    left renal ca dx'd 2018   renal cancer - left kidney removed, pill chemo x 1 yr   Myocardial infarction ([place]) 1991   no deficits   SVD (spontaneous vaginal delivery)    x 3   Wears glasses    Past Surgical History:  Procedure Laterality Date   BALLOON DILATION N/A 07/23/2018   Procedure: BALLOON DILATION;  Surgeon: Irving Copas., MD;  Location: [place];  Service: Gastroenterology;  Laterality: N/A;    BILIARY BRUSHING  08/10/2018   Procedure: BILIARY BRUSHING;  Surgeon: Rush Landmark Telford Nab., MD;  Location: [place];  Service: Gastroenterology;;   BILIARY DILATION  08/10/2018   Procedure: BILIARY DILATION;  Surgeon: Irving Copas., MD;  Location: [place];  Service: Gastroenterology;;   BILIARY DILATION  08/27/2018   Procedure: BILIARY DILATION;  Surgeon: Irving Copas., MD;  Location: [place];  Service: Gastroenterology;;   BILIARY STENT PLACEMENT  08/10/2018   Procedure: BILIARY STENT PLACEMENT;  Surgeon: Irving Copas., MD;  Location: [place];  Service: Gastroenterology;;   BILIARY STENT PLACEMENT  08/27/2018   Procedure: BILIARY STENT PLACEMENT;  Surgeon: Irving Copas., MD;  Location: [place];  Service: Gastroenterology;;   BIOPSY  07/23/2018   Procedure: BIOPSY;  Surgeon: Irving Copas., MD;  Location: [place];  Service: Gastroenterology;;   CHOLECYSTECTOMY N/A 09/02/2018   Procedure: LAPAROSCOPIC CHOLECYSTECTOMY;  Surgeon: Stark [place],  MD;  Location: [place];  Service: General;  Laterality: N/A;   COLONOSCOPY     normal    ENDOSCOPIC RETROGRADE CHOLANGIOPANCREATOGRAPHY (ERCP) WITH PROPOFOL N/A 08/10/2018   Procedure: ENDOSCOPIC RETROGRADE CHOLANGIOPANCREATOGRAPHY (ERCP) WITH PROPOFOL;  Surgeon: Rush Landmark Telford Nab., MD;  Location: [place];  Service: Gastroenterology;  Laterality: N/A;   ERCP N/A 07/23/2018   Procedure: ENDOSCOPIC RETROGRADE CHOLANGIOPANCREATOGRAPHY (ERCP);  Surgeon: Irving Copas., MD;  Location: [place];  Service: Gastroenterology;  Laterality: N/A;   ERCP N/A 08/27/2018   Procedure: ENDOSCOPIC RETROGRADE CHOLANGIOPANCREATOGRAPHY (ERCP);  Surgeon: Irving Copas., MD;  Location: [place];  Service: Gastroenterology;  Laterality: N/A;   ESOPHAGOGASTRODUODENOSCOPY (EGD) WITH PROPOFOL N/A 08/27/2018   Procedure: ESOPHAGOGASTRODUODENOSCOPY (EGD) WITH PROPOFOL;   Surgeon: Rush Landmark Telford Nab., MD;  Location: [place];  Service: Gastroenterology;  Laterality: N/A;   EUS N/A 08/27/2018   Procedure: ESOPHAGEAL ENDOSCOPIC ULTRASOUND (EUS) RADIAL;  Surgeon: Rush Landmark Telford Nab., MD;  Location: [place];  Service: Gastroenterology;  Laterality: N/A;   FINE NEEDLE ASPIRATION  08/27/2018   Procedure: FINE NEEDLE ASPIRATION (FNA) LINEAR;  Surgeon: Irving Copas., MD;  Location: [place];  Service: Gastroenterology;;   IR IMAGING GUIDED PORT INSERTION  01/08/2018   LAPAROSCOPIC NEPHRECTOMY Left 08/01/2016   Procedure: LAPAROSCOPIC  RADICAL NEPHRECTOMY/ REPAIR OF UMBILICAL HERNIA;  Surgeon: Raynelle Bring, MD;  Location: [facility];  Service: Urology;  Laterality: Left;   REMOVAL OF STONES  07/23/2018   Procedure: REMOVAL OF GALL STONES;  Surgeon: Rush Landmark Telford Nab., MD;  Location: [place];  Service: Gastroenterology;;   REMOVAL OF STONES  08/10/2018   Procedure: REMOVAL OF STONES;  Surgeon: Irving Copas., MD;  Location: [place];  Service: Gastroenterology;;   REMOVAL OF STONES  08/27/2018   Procedure: REMOVAL OF STONES;  Surgeon: Irving Copas., MD;  Location: [place];  Service: Gastroenterology;;   [name]  07/23/2018   Procedure: [name];  Surgeon: Irving Copas., MD;  Location: [place];  Service: Gastroenterology;;   STENT REMOVAL  08/27/2018   Procedure: STENT REMOVAL;  Surgeon: Irving Copas., MD;  Location: [facility];  Service: Gastroenterology;;   UPPER GI ENDOSCOPY     x 1   Social History   Socioeconomic History   Marital status: Married    Spouse name: Not on file   Number of children: 3   Years of education: Not on file   Highest education level: Not on file  Occupational History   Occupation: retired  Scientist, product/process development strain: Not on file   Food insecurity    Worry: Not on file    Inability: Not on file   Transportation  needs    Medical: Not on file    Non-medical: Not on file  Tobacco Use   Smoking status: Former Smoker    Packs/day: 1.00    Years: 8.00    Pack years: 8.00    Types: Cigarettes    Quit date: 06/18/1964    Years since quitting: 54.3   Smokeless tobacco: Never Used  Substance and Sexual Activity   Alcohol use: No   Drug use: No   Sexual activity: Not on file  Lifestyle   Physical activity    Days per week: Not on file    Minutes per session: Not on file   Stress: Not on file  Relationships   Social connections    Talks on phone: Not on file    Gets together: Not on file  Attends religious service: Not on file    Active member of club or organization: Not on file    Attends meetings of clubs or organizations: Not on file    Relationship status: Not on file   Intimate partner violence    Fear of current or ex partner: Not on file    Emotionally abused: Not on file    Physically abused: Not on file    Forced sexual activity: Not on file  Other Topics Concern   Not on file  Social History Narrative   Married.  Three children   Family History  Problem Relation Age of Onset   Heart attack Father 68   Heart disease Brother 12       CABG   Colon cancer Neg Hx    Esophageal cancer Neg Hx    Inflammatory bowel disease Neg Hx    Liver disease Neg Hx    Pancreatic cancer Neg Hx    Rectal cancer Neg Hx    Stomach cancer Neg Hx    I have reviewed her medical, social, and family history in detail and updated the electronic medical record as necessary.    PHYSICAL EXAMINATION  Telehealth visit   REVIEW OF DATA  I reviewed the following data at the time of this encounter:  GI Procedures and Studies  May 2020 ERCP - Prior biliary sphincterotomy appeared open. Previously placed stent had been removed during EUS. - A single mild biliary narrowing was found in the lower third of the main bile duct. This was improved from prior ERCP. This region was  dilated further with a balloon. Then it was brushed for cytology. - The upper third of the main bile duct and middle third of the main bile duct were moderately dilated. - The biliary tree was swept and sludge was found. No evidence of pus/cholangitis at this time. - Two plastic biliary stents were placed into the common bile duct to traverse the narrowing at the distal duct, but contrast moved appropriately before the stents were placed.  May 2020 EUS EGD Impression: - No gross lesions in the stomach. - Plastic biliary stent in the duodenum. Removed. - Patent sphincterotomy, characterized by healthy appearing mucosa was found. EUS Impression: - There was dilation in the common bile duct. Mild pneumobilia vs biliary sludge noted but no overt choledocholithiasis. - There was a suggestion of a narrowing in the distal lower third of the main bile duct. Fineneedle biopsy performed in this area due to previously noted atypical biliary brushings. - Material consistent with moderate sludge was visualized endosonographically in the gallbladder. No overt pericholecystic fluid was noted in regards to concern for overt cholecystitis. - Pancreatic parenchymal abnormalities consisting of diffuse heterogeneity were noted in the entire pancreas. No mass was appreciated at the region of the common bile duct narrowing. - Renal cysts vs peripancreatic fluid collections - based on recent MRI more likely renal cysts present.  Laboratory Studies  Reviewed in epic  Imaging Studies  May 2020 CT abdomen pelvis without contrast IMPRESSION: 1. Common duct stent in place. Improvement to resolution in biliary duct dilatation compared to 08/29/2018. Interval cholecystectomy without acute complication. 2. Left nephrectomy, without evidence of metastatic disease. 3.  Tiny hiatal hernia. 4. Coronary artery atherosclerosis. Aortic Atherosclerosis (ICD10-I70.0). 5. Mild limitations secondary to lack of IV  contrast. 6. Marked pelvic floor laxity with cystocele and rectal prolapse   ASSESSMENT  Ms. Chavira is a 78 y.o. female with a pmh significant for RCC (  s/p Nephrectomy on Chemo), DM, GERD, HTN, HLD, CAD, OA, Symptomatic Gallstone Disease (status post ERCP and cholecystectomy), distal biliary stricture (indeterminate concern for possible secondary malignancy-currently stented), chronic constipation, rectal prolapse.  The patient is seen today for evaluation and management of:  1. Abnormal LFTs   2. Abdominal pain, unspecified abdominal location   3. Biliary stricture   4. Constipation, unspecified constipation type   5. Anemia, unspecified type    The patient is hemodynamically and clinically stable at this point in time.  The patient's recurrent episodes of abdominal pain with elevations in transferase's as well as pancreatic enzymes remain unclear at this point in time.  I wonder, if the patient is having periods of biliary sludge that may be causing issues at times.  However, after reviewing the most recent CAT scan it seems that the patient is not having any progression of her intrahepatic ductal disease if anything, she is having improvement in her overall ductal dilation.  I am very happy with hearing about this.  However, as she continues to have these episodes even though she is doing well I have recommended that she continue to maintain a low-fat diet overall.  Her bowels are moving, and this may be a result of her gallbladder now no longer being in place and as long as she is moving her bowels I am okay with that.  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  At this point in time, I do think it is worthwhile for [name] to go ahead and place a more permanent stent for a longer period in time.  Now that her gallbladder is gone I am able to freely place a fully covered metal stent into the distal duct.  Over the course of the next few weeks I  like to also obtain labs to rule out intrinsic liver disease processes such as autoimmune hepatitis and other viral hepatitis as well.  The risks of an ERCP were discussed at length, including but not limited to the risk of perforation, bleeding, abdominal pain, post-ERCP pancreatitis (while usually mild can be severe and even life threatening).  All patient questions were answered, to the best of my ability, and the patient agrees to the aforementioned plan of action with follow-up as indicated.   PLAN  Laboratories as outlined below Monitor stools on a daily basis (may use MiraLAX if things slow down) Consideration of cystocele/rectal prolapse treatment in future by our surgical colleagues if necessary We will proceed with scheduling ERCP for exchange of plastic stents to metal biliary stent Holding on liver biopsy currently   Orders Placed This Encounter  Procedures   Hepatic function panel   IBC + Ferritin   Vitamin B12   Tissue transglutaminase, IgA   IgA   Protime-INR   ANA   Mitochondrial antibodies   Anti-smooth muscle antibody, IgG   Hepatitis A antibody, total   Hepatitis B surface antibody,qualitative   Hepatitis B surface antigen   Hepatitis B Core Antibody, total   Hepatitis C antibody   CK   Gamma GT    New Prescriptions   No medications on file   Modified Medications   No medications on file    Planned Follow Up: No follow-ups on file.   Justice Britain, MD [facility] Office # [phone]

## 2018-10-07 NOTE — Patient Instructions (Signed)
Your provider has requested that you go to the basement level for lab work. Press "B" on the elevator. The lab is located at the first door on the left as you exit the elevator.  Continue Miralax.

## 2018-10-08 NOTE — Progress Notes (Signed)
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 The patient's last visit with [name] was on 09/11/18. The pt reports that she is doing well overall.  The pt reports that she has continued follow up with Dr. Rush Landmark in GI for evaluation and work up of her liver. She notes that she is not currently having abdominal pain, and notes that she hasn't had abdominal pain during the last week. She notes that she is trying to avoid fried foods, and notes that nine days ago she developed significant abdominal pain and discomfort after eating fried chicken. She notes that she is eating well overall however, has had stable weight, and is moving her bowels well.   The pt notes that her lower back pain continues to hurt, and is no different than her baseline.  Of note since the patient's last visit, pt has had CT A/P completed on 09/18/18 with results revealing "Common duct stent in place. Improvement to resolution in biliary duct dilatation compared to 08/29/2018. Interval cholecystectomy without acute complication. 2. Left nephrectomy, without evidence of metastatic disease. 3.  Tiny hiatal hernia. 4. Coronary artery atherosclerosis. Aortic Atherosclerosis. 5. Mild limitations secondary to lack of IV contrast. 6. Marked pelvic floor laxity with cystocele and rectal prolapse".  Lab results today (10/09/18) of CBC w/diff and CMP is as follows: all values are WNL except for Monocytes abs at 1.1k, Abs immature granulocytes at 0.11k,  Glucose at 145, BUN at 24, Creatinine at 1.45, ALT at 52, GFR at 35.  On review of systems, pt reports moving her bowels well, eating well, stable weight, and denies current abdominal pain, concerns for infections, and any other symptoms.   MEDICAL HISTORY:    Hypertension Dyslipidemia Osteoarthritis Ex-smoker Coronary artery disease Thyroid disorder-was apparently on levothyroxine 25 g daily which has subsequently been discontinued . 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 Earley Brooke Colonoscopy 03/2016 Dr. Earley Brooke  SOCIAL HISTORY: Social History   Socioeconomic History   Marital status: Married    Spouse name: Not on file   Number of children: 3   Years of education: Not on file   Highest education level: Not on file  Occupational History   Occupation: retired  Scientist, product/process development strain: Not on file   Food insecurity    Worry: Not on file    Inability: Not on Lexicographer needs    Medical: Not on file    Non-medical: Not on file  Tobacco Use   Smoking status: Former Smoker    Packs/day: 1.00    Years: 8.00    Pack years: 8.00    Types: Cigarettes    Quit date: 06/18/1964    Years since quitting: 54.3   Smokeless tobacco: Never Used  Substance and Sexual Activity   Alcohol use: No   Drug use: No   Sexual activity: Not on file  Lifestyle   Physical activity    Days per week: Not on file    Minutes per session: Not on file   Stress: Not on file  Relationships   Social connections    Talks on phone: Not on file    Gets together: Not on file    Attends religious service: Not on file    Active member of club or organization: Not on file    Attends meetings of clubs or organizations: Not on file    Relationship status: Not on file   Intimate partner violence    Fear of  current or ex partner: Not on file    Emotionally abused: Not on file    Physically abused: Not on file    Forced sexual activity: Not on file  Other Topics Concern   Not on file  Social History Narrative   Married.  Three children   Patient lives in [place] Works as a Solicitor part-time Ex-smoker quit long time ago In 1961 . Smoked 2 packs per day for 8 years prior to that .  or a lot of stress since her daughter is also getting treated for stage IV uterine cancer.  FAMILY HISTORY:  Mother deceased Father with asthma and emphysema died at 41 years with the MI. Brother deceased of heart disease   ALLERGIES:  is allergic to adhesive [tape]; gabapentin; nsaids; and statins.  MEDICATIONS:  Current Outpatient Medications  Medication Sig Dispense Refill   acetaminophen (TYLENOL) 325 MG tablet Take 2 tablets (650 mg total) by mouth every 6 (six) hours as needed for mild pain. (Patient not taking: Reported on 09/23/2018)     amLODipine (NORVASC) 5 MG tablet Take 5 mg by mouth daily.   6   atorvastatin (LIPITOR) 40 MG tablet Take 40 mg by mouth at bedtime.      bisacodyl (DULCOLAX) 5 MG EC tablet Take 5 mg by mouth at bedtime.     Calcium 600-200 MG-UNIT tablet Take 1 tablet by mouth daily after breakfast.      carvedilol (COREG) 6.25 MG tablet Take 6.25 mg by mouth 2 (two) times daily.  6   clobetasol ointment (TEMOVATE) 4.69 % Apply 1 application topically 2 (two) times daily as needed (lichen sclerosis).      docusate sodium (COLACE) 100 MG capsule Take 100 mg by mouth at bedtime.     hydrocortisone (CORTEF) 10 MG tablet Take 5-10 mg by mouth See admin instructions. Take 1 tablet (10 mg) by mouth in the morning & 1/2 tablet (5 mg) by mouth in the evening.     levothyroxine (SYNTHROID) 75 MCG tablet Take 75 mcg by mouth daily before breakfast.      lidocaine-prilocaine (EMLA) cream Apply to affected area once (Patient taking differently: Apply 1 application  topically daily as needed (prior to port access). ) 30 g 3   Nivolumab (OPDIVO IV) Inject 1 Dose into the vein every 14 (fourteen) days. [place]     omeprazole (PRILOSEC) 20 MG capsule Take 1 capsule (20 mg total) by mouth 2 (two) times daily. 60 capsule 2   oxyCODONE (OXY IR/ROXICODONE) 5 MG immediate release tablet Take 1 tablet (5 mg total) by mouth every 6 (six) hours as needed for severe pain. (Patient taking differently: Take 10 mg by mouth every 6 (six) hours as needed for severe pain. ) 15 tablet 0   polyethylene glycol powder (MIRALAX) 17 GM/SCOOP powder Start taking 1 capful 3 times a day.  Slowly cut back as needed until you have normal bowel movements. 255 g 0   repaglinide (PRANDIN) 0.5 MG tablet Take 0.5 mg by mouth 2 (two) times daily before a meal.     No current facility-administered medications for this visit.     REVIEW OF SYSTEMS:    A 10+ POINT REVIEW OF SYSTEMS WAS OBTAINED including neurology, dermatology, psychiatry, cardiac, respiratory, lymph, extremities, GI, GU, Musculoskeletal, constitutional, breasts, reproductive, HEENT.  All pertinent positives are noted in the HPI.  All others are negative.   PHYSICAL EXAMINATION:  ECOG PERFORMANCE STATUS: 1 - Symptomatic but completely ambulatory  Vitals:   10/09/18 0904  BP: (!) 145/62  Pulse: 72  Resp: 17  Temp: 98.5 F (36.9 C)  SpO2: 100%   Filed Weights   10/09/18 0904  Weight: 126 lb 4.8 oz (57.3 kg)   .Body mass index is 25.51 kg/m.   GENERAL:alert, in no acute distress and comfortable SKIN: no acute rashes, no significant lesions EYES: conjunctiva are pink and non-injected, sclera anicteric OROPHARYNX: MMM, no exudates, no oropharyngeal erythema or ulceration NECK: supple, no JVD LYMPH:  no palpable lymphadenopathy in the cervical, axillary or inguinal regions LUNGS: clear to auscultation b/l with normal respiratory effort HEART: regular rate & rhythm ABDOMEN:  normoactive bowel sounds ,  non tender, not distended. No palpable hepatosplenomegaly.  Extremity: no pedal edema PSYCH: alert & oriented x 3 with fluent speech NEURO: no focal motor/sensory deficits   LABORATORY DATA:  I have reviewed the data as listed  . CBC Latest Ref Rng & Units 10/09/2018 09/25/2018 09/22/2018  WBC 4.0 - 10.5 K/uL 9.9 8.7 7.7  Hemoglobin 12.0 - 15.0 g/dL 12.2 11.4(L) 11.7(L)  Hematocrit 36.0 - 46.0 % 38.5 36.2 36.7  Platelets 150 - 400 K/uL 274 260 281    CBC    Component Value Date/Time   WBC 9.9 10/09/2018 0841   RBC 4.10 10/09/2018 0841   HGB 12.2 10/09/2018 0841   HGB 11.4 (L) 09/11/2018 0900   HGB 11.2 (L) 04/04/2017 0830   HCT 38.5 10/09/2018 0841   HCT 35.2 04/04/2017 0830   PLT 274 10/09/2018 0841   PLT 277 09/11/2018 0900   PLT 250 04/04/2017 0830   MCV 93.9 10/09/2018 0841   MCV 107.6 (H) 04/04/2017 0830   MCH 29.8 10/09/2018 0841   MCHC 31.7 10/09/2018 0841   RDW 13.5 10/09/2018 0841   RDW 14.0 04/04/2017 0830   LYMPHSABS 1.3 10/09/2018 0841   LYMPHSABS 1.6 04/04/2017 0830   MONOABS 1.1 (H) 10/09/2018 0841   MONOABS 0.4 04/04/2017 0830   EOSABS 0.2 10/09/2018 0841   EOSABS 0.1 04/04/2017 0830   BASOSABS 0.1 10/09/2018 0841   BASOSABS 0.0 04/04/2017 0830     . CMP Latest Ref Rng & Units 10/09/2018 09/28/2018 09/25/2018  Glucose 70 - 99 mg/dL 145(H) - 153(H)  BUN 8 - 23 mg/dL 24(H) - 17  Creatinine 0.44 - 1.00 mg/dL 1.45(H) - 1.31(H)  Sodium 135 - 145 mmol/L 135 - 136  Potassium 3.5 - 5.1 mmol/L 4.6 - 4.7  Chloride 98 - 111 mmol/L 104 - 103  CO2 22 - 32 mmol/L 22 - 26  Calcium 8.9 - 10.3 mg/dL 9.6 - 8.5(L)  Total Protein 6.5 - 8.1 g/dL 6.7 6.7 6.3(L)  Total Bilirubin 0.3 - 1.2 mg/dL 0.5 0.4 0.3  Alkaline Phos 38 - 126 U/L 102 111 145(H)  AST 15 - 41 U/L 19 19 122(H)  ALT 0 - 44 U/L 52(H) 63(H)  230(H)   B12 level 299 (OSH)-->455  . Lab Results  Component Value Date   LDH 136 03/13/2018        08/27/18 Surgical Pathology from  Cholecystectomy:         RADIOGRAPHIC STUDIES: I have personally reviewed the radiological images as listed and agreed with the findings in the report. Nm Pet Image Restag (ps) Skull Base To Thigh  Result Date: 01/03/2017 IMPRESSION: 1. There is a new lesion in the hepatic dome which is FDG avid and low in attenuation on CT imaging consistent with metastatic disease. Another region of uptake in the left hepatic lobe posteriorly demonstrates no CT correlate. A second subtle metastasis is not excluded on today's study. An MRI could better assess for other hepatic metastases if clinically warranted. 2. New metastatic lesion in the left side of T8. 3. New pulmonary nodule in the right upper lobe. This is too small to characterize but suspicious. Recommend attention on follow-up. No FDG avid nodules or other enlarging nodules. 4. The uptake at the previous left renal artery has almost resolved in the interval and is favored to be post therapeutic. Recommend attention on follow-up. 5. The metastasis in the posterior right hilum seen previously has resolved. Electronically Signed   By: Dorise Bullion III M.D   On: 01/03/2017 11:16   .Ct Abdomen Pelvis Wo Contrast  Result Date: 09/18/2018 CLINICAL DATA:  Abdominal distension for 2 weeks. Status post gallbladder surgery 2 weeks ago. Left renal cell carcinoma with nephrectomy in 2018. Adrenalectomy. Oral chemotherapy completed last June. Immunotherapy on going. EXAM: CT ABDOMEN AND PELVIS WITHOUT CONTRAST TECHNIQUE: Multidetector CT imaging of the abdomen and pelvis was performed following the standard protocol without IV contrast. COMPARISON:  08/29/2018.  Ultrasound 08/29/2018. FINDINGS: Lower chest: Bibasilar scarring. Cardiomegaly with minimal anterior pericardial fluid or thickening. Multivessel coronary artery atherosclerosis. Tiny hiatal hernia. Hepatobiliary: No focal liver lesion, given limitation of noncontrast exam. Cholecystectomy. Pneumobilia.  The common duct measures 9 mm in the porta hepatis on image 25/2. Comparison 1.4 cm on 08/29/2018 (when remeasured). Common duct stent originates at the level of the pancreatic head and terminates in the transverse duodenum. Pancreas: Normal, without mass or ductal dilatation. Spleen: Normal in size, without focal abnormality. Adrenals/Urinary Tract: Left nephrectomy and probable adrenalectomy as per clinical history. Right renal sinus cysts, without hydroureter. Large cystocele. Stomach/Bowel: Normal remainder of the stomach. Rectal prolapse. Scattered colonic diverticula. Normal terminal ileum and appendix. Normal small bowel. Vascular/Lymphatic: Advanced aortic and branch vessel atherosclerosis. No abdominopelvic adenopathy. Reproductive: Uterine descent/prolapse.  No adnexal mass. Other: Marked pelvic floor laxity. No significant free fluid. Fat containing ventral pelvic wall laxity. Musculoskeletal: Presumably degenerative sclerosis about the sacroiliac joints bilaterally. Osteopenia. Lumbosacral spondylosis with trace L4-5 anterolisthesis. S-shaped lumbar spine curvature. IMPRESSION: 1. Common duct stent in place. Improvement to resolution in biliary duct dilatation compared to 08/29/2018. Interval cholecystectomy without acute complication. 2. Left nephrectomy, without evidence of metastatic disease. 3.  Tiny hiatal hernia. 4. Coronary artery atherosclerosis. Aortic Atherosclerosis (ICD10-I70.0). 5. Mild limitations secondary to lack of IV contrast. 6. Marked pelvic floor laxity with cystocele and rectal prolapse. Electronically Signed   By: Abigail Miyamoto M.D.   On: 09/18/2018 15:22   Dg Abd Acute 2+v W 1v Chest  Result Date: 09/23/2018 CLINICAL DATA:  Abdominal discomfort. EXAM: DG ABDOMEN ACUTE W/ 1V CHEST COMPARISON:  08/30/2018.  09/18/2018. FINDINGS: There is a well-positioned right-sided Port-A-Cath in place. The lungs are essentially clear with no evidence of a pneumothorax  or large pleural effusion.  There is no acute osseous abnormality detected. There is a moderate to large amount of stool throughout the colon. Vascular calcifications are noted. Plastic biliary stents are again noted. Or there is a tubular contrast filled structure located in the patient's right hemipelvis which is favored to represent a contrast filled appendix. IMPRESSION: 1. No acute cardiopulmonary process. 2. Nonobstructive bowel gas pattern. 3. Moderate amount of stool throughout the colon. 4. Plastic biliary stents are again noted. Electronically Signed   By: Constance Holster M.D.   On: 09/23/2018 02:37    ASSESSMENT & PLAN:    78 y.o. 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 PET/CT 06/19/2017 -- consistent with partial metabolic response to treatment.  Rt adrenal gland bx - showed clear cell RCC  S/p CYtoreductive left radical nephrectomy and left adrenal gland resection on 08/01/2016 by Dr Alinda Money.  10/16/17 PET/CT revealed Continued improvement, with the T7 metastatic lesion no longer significantly hypermetabolic. The previous right lower lobe pulmonary nodule is even less apparent, perhaps about 2 mm in diameter today; given that this measured 6 mm on 03/28/2017 this probably represents an effectively treated metastatic lesion. Currently no appreciable hypermetabolic activity is identified to suggest active malignancy. Distended gallbladder with gallbladder wall thickening and gallstones. Correlate clinically in assessing for cholecystitis. Small but abnormal amount of free pelvic fluid, nonspecific. Other imaging findings of potential clinical significance: Chronic ethmoid sinusitis. Aortic Atherosclerosis. Stable 5 mm right middle lobe pulmonary nodule, not hypermetabolic but below sensitive PET-CT size thresholds. Left nephrectomy. Notable pelvic floor laxity with cystocele. Chronic  bilateral Sacroiliitis.  04/03/18 PET/CT revealed No evidence for new or progressive hypermetabolic disease on today's study to suggest metastatic progression. 2. Stable appearance of the T7 metastatic lesion without Hypermetabolism. 3. Tiny focus of FDG accumulation identified along the skin of the low right inguinal fold. No associated lesion evident on CT. This may be related to urinary contaminant. 4. Cholelithiasis with similar appearance of diffuse gallbladder wall thickening. 5. Stable 5 mm right middle lobe pulmonary nodule. 6.  Aortic Atherosclerois. 7. Diffuse colonic diverticulosis.   #2 b/l adrenal metastases from [place] s/p left adrenalectomy with adrenal insuff - follows with Dr Buddy Duty.  #3 Small pulmonary lesions -- 10/16/17 PET/CT showed pulmonary less apparent than 03/28/17 PET/CT, decreasing from 25mm to 40mm diameter.   MRI brain shows no evidence of metastatic disease  #4 T7/8 Bone metastases - received Xgeva every 4 weeks from May 2018 to June 2019. 10/16/17 PET/CT revealed improvement with T7 metastatic lesion no longer significantly hypermetabolic.  -on Marchelle Folks  #5 ?liver mets- rpt PET/CT from 10/16/2017 shows no overt evidence of metastatic disease in the liver.  #6 Grade 1 Nausea - improved and intermittent. Hasnt used her anti-emetic as instructed so some nausea and decreased po intake.  #7 Grade 1 Diarrhea - resolved  #8 Hyponatremia -  resolved with sodium at 137 - likely related to some element of adrenal insufficiency, diarrhea, limited by mouth intake. Primarily solute free fluid intake.   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 Buddy Duty  #14 HTN - ?control. Patient tends to be anxious and has higher blood pressures in the clinic. She can have increased blood pressure from Sutent as well. Plan: -continued close f/u with her PCP /cardiology regarding the many elements necessary for her care that are not directly related to her oncology care. -ACEI held due to AKI and hyperkalemia - following with cardiology to mx this. Has been started on Amlodipine instead.  #15 Grade 1 mucositis- resolved -Advised the patient to continue use of Magic Mouthwash to aid with nutrition  #16 Newly diagnosed ?CAD - positive cardiac nuclear stress test Following with cardiology and nephrology ? Need for pre-operative cardiac cath  #17 Choledocholithiasis and cholelithiasis 07/10/18 US Abdomen revealed Biliary duct dilatation with 8 mm calculus at the level of the ampulla in the distal common bile duct. 2. Cholelithiasis. No gallbladder wall thickening or pericholecystic fluid. 3. Appearance of the liver raises concern for underlying hepatic cirrhosis. No focal liver lesions are demonstrable. 4.  Left kidney absent. 5.  Small cysts in right kidney. 6.  Aortic Atherosclerosis.  S/p ERCP on 07/23/18 with Dr. Valarie Merino Mansouraty  09/02/18 Cytopathology from ERCP was suspicious for malignant cells in bile duct, but was not definitive, other two findings were benign.  PLAN:  -Discussed pt labwork today, 10/09/18; blood counts improved overall, chemistries are pending -Discussed the 09/18/18 CT A/P which revealed "Common duct stent in place. Improvement to resolution in biliary duct dilatation compared to  08/29/2018. Interval cholecystectomy without acute complication. 2. Left nephrectomy, without evidence of metastatic disease. 3.  Tiny hiatal hernia. 4. Coronary artery atherosclerosis. Aortic Atherosclerosis. 5. Mild limitations secondary to lack of IV contrast. 6. Marked pelvic floor laxity with cystocele and rectal prolapse". -The pt has no prohibitive toxicities from continuing her next cycle of Nivolumab, every 2 weeks at this time. -Pt having sludge intermittently blocking her bile duck? would defer question of Ursodiol use to GI -Discussed that if the pt were to actually have a primary bile duct neoplasm, there would be major surgery required and given the significance of these considerations I will speak with pathologist in the interim -Continue follow up with endocrinology -Recommend using Senna or Miralax for constipation -Continue with OTC Lidocaine patch for lower back pain  -Recommend that the pt keep well moisturized, suspecting that patient does have dry skin -Hydroxyzine as well for itching -Continue Xgeva every 4 weeks -Recommended that the pt continue to eat well, drink at least 48-64 oz of water each day, and walk 20-30 minutes each day. -Will see the pt back in 4 weeks    Please schedule next 4 doses of Nivolumab q2weeks with labs RTC with Dr Irene Limbo in 4 weeks   All of the patients questions were answered with apparent satisfaction. The patient knows to call the clinic with any problems, questions or concerns.  The total time spent in the appt was 25 minutes and more than 50% was on counseling and direct patient cares.   Sullivan Lone MD [place] AAHIVMS [facility] Hematology/Oncology Physician [facility]  (Office):       [phone] (Work cell):  [phone] (Fax):  [phone]  I, Baldwin [place], am acting as a scribe for Dr. Sullivan Lone.   .I have reviewed the above documentation for accuracy and completeness, and I agree with the above. Brunetta Genera MD

## 2018-10-09 ENCOUNTER — Telehealth: Payer: Self-pay | Admitting: Hematology

## 2018-10-09 ENCOUNTER — Inpatient Hospital Stay: Payer: Medicare Other

## 2018-10-09 ENCOUNTER — Other Ambulatory Visit: Payer: Self-pay

## 2018-10-09 ENCOUNTER — Inpatient Hospital Stay (HOSPITAL_BASED_OUTPATIENT_CLINIC_OR_DEPARTMENT_OTHER): Payer: Medicare Other | Admitting: Hematology

## 2018-10-09 VITALS — BP 145/62 | HR 72 | Temp 98.5°F | Resp 17 | Ht 59.0 in | Wt 126.3 lb

## 2018-10-09 DIAGNOSIS — E119 Type 2 diabetes mellitus without complications: Secondary | ICD-10-CM

## 2018-10-09 DIAGNOSIS — N289 Disorder of kidney and ureter, unspecified: Secondary | ICD-10-CM

## 2018-10-09 DIAGNOSIS — I10 Essential (primary) hypertension: Secondary | ICD-10-CM

## 2018-10-09 DIAGNOSIS — E079 Disorder of thyroid, unspecified: Secondary | ICD-10-CM

## 2018-10-09 DIAGNOSIS — K449 Diaphragmatic hernia without obstruction or gangrene: Secondary | ICD-10-CM

## 2018-10-09 DIAGNOSIS — C7951 Secondary malignant neoplasm of bone: Secondary | ICD-10-CM | POA: Diagnosis not present

## 2018-10-09 DIAGNOSIS — E860 Dehydration: Secondary | ICD-10-CM

## 2018-10-09 DIAGNOSIS — K623 Rectal prolapse: Secondary | ICD-10-CM

## 2018-10-09 DIAGNOSIS — M1990 Unspecified osteoarthritis, unspecified site: Secondary | ICD-10-CM

## 2018-10-09 DIAGNOSIS — Z7189 Other specified counseling: Secondary | ICD-10-CM

## 2018-10-09 DIAGNOSIS — Z5112 Encounter for antineoplastic immunotherapy: Secondary | ICD-10-CM

## 2018-10-09 DIAGNOSIS — E875 Hyperkalemia: Secondary | ICD-10-CM

## 2018-10-09 DIAGNOSIS — C642 Malignant neoplasm of left kidney, except renal pelvis: Secondary | ICD-10-CM

## 2018-10-09 DIAGNOSIS — Z95828 Presence of other vascular implants and grafts: Secondary | ICD-10-CM

## 2018-10-09 DIAGNOSIS — N8110 Cystocele, unspecified: Secondary | ICD-10-CM

## 2018-10-09 DIAGNOSIS — I2510 Atherosclerotic heart disease of native coronary artery without angina pectoris: Secondary | ICD-10-CM

## 2018-10-09 DIAGNOSIS — Z87891 Personal history of nicotine dependence: Secondary | ICD-10-CM

## 2018-10-09 DIAGNOSIS — I252 Old myocardial infarction: Secondary | ICD-10-CM

## 2018-10-09 DIAGNOSIS — C7800 Secondary malignant neoplasm of unspecified lung: Secondary | ICD-10-CM

## 2018-10-09 DIAGNOSIS — E871 Hypo-osmolality and hyponatremia: Secondary | ICD-10-CM

## 2018-10-09 DIAGNOSIS — I340 Nonrheumatic mitral (valve) insufficiency: Secondary | ICD-10-CM

## 2018-10-09 DIAGNOSIS — R110 Nausea: Secondary | ICD-10-CM

## 2018-10-09 DIAGNOSIS — K649 Unspecified hemorrhoids: Secondary | ICD-10-CM

## 2018-10-09 DIAGNOSIS — I700 Atherosclerosis of aorta: Secondary | ICD-10-CM

## 2018-10-09 DIAGNOSIS — E538 Deficiency of other specified B group vitamins: Secondary | ICD-10-CM

## 2018-10-09 DIAGNOSIS — E785 Hyperlipidemia, unspecified: Secondary | ICD-10-CM

## 2018-10-09 DIAGNOSIS — M545 Low back pain: Secondary | ICD-10-CM

## 2018-10-09 LAB — CMP (CANCER CENTER ONLY)
ALT: 52 U/L — ABNORMAL HIGH (ref 0–44)
AST: 19 U/L (ref 15–41)
Albumin: 3.7 g/dL (ref 3.5–5.0)
Alkaline Phosphatase: 102 U/L (ref 38–126)
Anion gap: 9 (ref 5–15)
BUN: 24 mg/dL — ABNORMAL HIGH (ref 8–23)
CO2: 22 mmol/L (ref 22–32)
Calcium: 9.6 mg/dL (ref 8.9–10.3)
Chloride: 104 mmol/L (ref 98–111)
Creatinine: 1.45 mg/dL — ABNORMAL HIGH (ref 0.44–1.00)
GFR, Est AFR Am: 40 mL/min — ABNORMAL LOW (ref >60)
GFR, Estimated: 35 mL/min — ABNORMAL LOW (ref >60)
Glucose, Bld: 145 mg/dL — ABNORMAL HIGH (ref 70–99)
Potassium: 4.6 mmol/L (ref 3.5–5.1)
Sodium: 135 mmol/L (ref 135–145)
Total Bilirubin: 0.5 mg/dL (ref 0.3–1.2)
Total Protein: 6.7 g/dL (ref 6.5–8.1)

## 2018-10-09 LAB — CBC WITH DIFFERENTIAL/PLATELET
Abs Immature Granulocytes: 0.11 10*3/uL — ABNORMAL HIGH (ref 0.00–0.07)
Basophils Absolute: 0.1 10*3/uL (ref 0.0–0.1)
Basophils Relative: 1 %
Eosinophils Absolute: 0.2 10*3/uL (ref 0.0–0.5)
Eosinophils Relative: 2 %
HCT: 38.5 % (ref 36.0–46.0)
Hemoglobin: 12.2 g/dL (ref 12.0–15.0)
Immature Granulocytes: 1 %
Lymphocytes Relative: 13 %
Lymphs Abs: 1.3 10*3/uL (ref 0.7–4.0)
MCH: 29.8 pg (ref 26.0–34.0)
MCHC: 31.7 g/dL (ref 30.0–36.0)
MCV: 93.9 fL (ref 80.0–100.0)
Monocytes Absolute: 1.1 10*3/uL — ABNORMAL HIGH (ref 0.1–1.0)
Monocytes Relative: 11 %
Neutro Abs: 7.2 10*3/uL (ref 1.7–7.7)
Neutrophils Relative %: 72 %
Platelets: 274 10*3/uL (ref 150–400)
RBC: 4.1 MIL/uL (ref 3.87–5.11)
RDW: 13.5 % (ref 11.5–15.5)
WBC: 9.9 10*3/uL (ref 4.0–10.5)
nRBC: 0 % (ref 0.0–0.2)

## 2018-10-09 MED ORDER — SODIUM CHLORIDE 0.9% FLUSH
10.0000 mL | INTRAVENOUS | Status: DC | PRN
  Administered 2018-10-09: 10 mL
  Filled 2018-10-09: qty 10

## 2018-10-09 MED ORDER — HEPARIN SOD (PORK) LOCK FLUSH 100 UNIT/ML IV SOLN
500.0000 [IU] | Freq: Once | INTRAVENOUS | Status: AC | PRN
  Administered 2018-10-09: 500 [IU]
  Filled 2018-10-09: qty 5

## 2018-10-09 MED ORDER — DENOSUMAB 120 MG/1.7ML ~~LOC~~ SOLN
SUBCUTANEOUS | Status: AC
  Filled 2018-10-09: qty 1.7

## 2018-10-09 MED ORDER — DENOSUMAB 120 MG/1.7ML ~~LOC~~ SOLN
120.0000 mg | Freq: Once | SUBCUTANEOUS | Status: AC
  Administered 2018-10-09: 120 mg via SUBCUTANEOUS

## 2018-10-09 MED ORDER — SODIUM CHLORIDE 0.9 % IV SOLN
240.0000 mg | Freq: Once | INTRAVENOUS | Status: AC
  Administered 2018-10-09: 240 mg via INTRAVENOUS
  Filled 2018-10-09: qty 24

## 2018-10-09 MED ORDER — SODIUM CHLORIDE 0.9% FLUSH
10.0000 mL | Freq: Once | INTRAVENOUS | Status: AC
  Administered 2018-10-09: 10 mL
  Filled 2018-10-09: qty 10

## 2018-10-09 MED ORDER — SODIUM CHLORIDE 0.9 % IV SOLN
Freq: Once | INTRAVENOUS | Status: AC
  Administered 2018-10-09: 10:00:00 via INTRAVENOUS
  Filled 2018-10-09: qty 250

## 2018-10-09 NOTE — Telephone Encounter (Signed)
Scheduled appt per 6/19 los.

## 2018-10-09 NOTE — Patient Instructions (Addendum)
[facility] Discharge Instructions for Patients Receiving Chemotherapy  Today you received the following chemotherapy agents: Opdivo.  To help prevent nausea and vomiting after your treatment, we encourage you to take your nausea medication as directed.  If you develop nausea and vomiting that is not controlled by your nausea medication, call the clinic.   BELOW ARE SYMPTOMS THAT SHOULD BE REPORTED IMMEDIATELY:  *FEVER GREATER THAN 100.5 F  *CHILLS WITH OR WITHOUT FEVER  NAUSEA AND VOMITING THAT IS NOT CONTROLLED WITH YOUR NAUSEA MEDICATION  *UNUSUAL SHORTNESS OF BREATH  *UNUSUAL BRUISING OR BLEEDING  TENDERNESS IN MOUTH AND THROAT WITH OR WITHOUT PRESENCE OF ULCERS  *URINARY PROBLEMS  *BOWEL PROBLEMS  UNUSUAL RASH Items with * indicate a potential emergency and should be followed up as soon as possible.  Feel free to call the clinic should you have any questions or concerns. The clinic phone number is [phone].  Please show the [place] at check-in to the Emergency Department and triage nurse.  Denosumab injection What is this medicine? DENOSUMAB (den oh sue mab) slows bone breakdown. Prolia is used to treat osteoporosis in women after menopause and in men, and in people who are taking corticosteroids for 6 months or more. Delton See is used to treat a high calcium level due to cancer and to prevent bone fractures and other bone problems caused by multiple myeloma or cancer bone metastases. Delton See is also used to treat giant cell tumor of the bone. This medicine may be used for other purposes; ask your health care provider or pharmacist if you have questions. COMMON BRAND NAME(S): Prolia, XGEVA What should I tell my health care provider before I take this medicine? They need to know if you have any of these conditions: -dental disease -having surgery or tooth extraction -infection -kidney disease -low levels of calcium or Vitamin D in the  blood -malnutrition -on hemodialysis -skin conditions or sensitivity -thyroid or parathyroid disease -an unusual reaction to denosumab, other medicines, foods, dyes, or preservatives -pregnant or trying to get pregnant -breast-feeding How should I use this medicine? This medicine is for injection under the skin. It is given by a health care professional in a hospital or clinic setting. A special MedGuide will be given to you before each treatment. Be sure to read this information carefully each time. For Prolia, talk to your pediatrician regarding the use of this medicine in children. Special care may be needed. For Delton See, talk to your pediatrician regarding the use of this medicine in children. While this drug may be prescribed for children as young as 13 years for selected conditions, precautions do apply. Overdosage: If you think you have taken too much of this medicine contact a poison control center or emergency room at once. NOTE: This medicine is only for you. Do not share this medicine with others. What if I miss a dose? It is important not to miss your dose. Call your doctor or health care professional if you are unable to keep an appointment. What may interact with this medicine? Do not take this medicine with any of the following medications: -other medicines containing denosumab This medicine may also interact with the following medications: -medicines that lower your chance of fighting infection -steroid medicines like prednisone or cortisone This list may not describe all possible interactions. Give your health care provider a list of all the medicines, herbs, non-prescription drugs, or dietary supplements you use. Also tell them if you smoke, drink alcohol, or use  illegal drugs. Some items may interact with your medicine. What should I watch for while using this medicine? Visit your doctor or health care professional for regular checks on your progress. Your doctor or health  care professional may order blood tests and other tests to see how you are doing. Call your doctor or health care professional for advice if you get a fever, chills or sore throat, or other symptoms of a cold or flu. Do not treat yourself. This drug may decrease your body's ability to fight infection. Try to avoid being around people who are sick. You should make sure you get enough calcium and vitamin D while you are taking this medicine, unless your doctor tells you not to. Discuss the foods you eat and the vitamins you take with your health care professional. See your dentist regularly. Brush and floss your teeth as directed. Before you have any dental work done, tell your dentist you are receiving this medicine. Do not become pregnant while taking this medicine or for 5 months after stopping it. Talk with your doctor or health care professional about your birth control options while taking this medicine. Women should inform their doctor if they wish to become pregnant or think they might be pregnant. There is a potential for serious side effects to an unborn child. Talk to your health care professional or pharmacist for more information. What side effects may I notice from receiving this medicine? Side effects that you should report to your doctor or health care professional as soon as possible: -allergic reactions like skin rash, itching or hives, swelling of the face, lips, or tongue -bone pain -breathing problems -dizziness -jaw pain, especially after dental work -redness, blistering, peeling of the skin -signs and symptoms of infection like fever or chills; cough; sore throat; pain or trouble passing urine -signs of low calcium like fast heartbeat, muscle cramps or muscle pain; pain, tingling, numbness in the hands or feet; seizures -unusual bleeding or bruising -unusually weak or tired Side effects that usually do not require medical attention (report to your doctor or health care  professional if they continue or are bothersome): -constipation -diarrhea -headache -joint pain -loss of appetite -muscle pain -runny nose -tiredness -upset stomach This list may not describe all possible side effects. Call your doctor for medical advice about side effects. You may report side effects to FDA at 1-800-FDA-1088. Where should I keep my medicine? This medicine is only given in a clinic, doctor's office, or other health care setting and will not be stored at home. NOTE: This sheet is a summary. It may not cover all possible information. If you have questions about this medicine, talk to your doctor, pharmacist, or health care provider.  2019 [employer] (2017-08-15 16:10:44)

## 2018-10-10 DIAGNOSIS — D649 Anemia, unspecified: Secondary | ICD-10-CM | POA: Insufficient documentation

## 2018-10-21 ENCOUNTER — Telehealth: Payer: Self-pay

## 2018-10-21 ENCOUNTER — Other Ambulatory Visit: Payer: Self-pay

## 2018-10-21 ENCOUNTER — Other Ambulatory Visit: Payer: Self-pay | Admitting: Gastroenterology

## 2018-10-21 DIAGNOSIS — R7989 Other specified abnormal findings of blood chemistry: Secondary | ICD-10-CM

## 2018-10-21 DIAGNOSIS — R945 Abnormal results of liver function studies: Secondary | ICD-10-CM

## 2018-10-21 DIAGNOSIS — D649 Anemia, unspecified: Secondary | ICD-10-CM

## 2018-10-21 DIAGNOSIS — K5900 Constipation, unspecified: Secondary | ICD-10-CM

## 2018-10-21 DIAGNOSIS — R109 Unspecified abdominal pain: Secondary | ICD-10-CM

## 2018-10-21 DIAGNOSIS — K831 Obstruction of bile duct: Secondary | ICD-10-CM

## 2018-10-21 NOTE — Progress Notes (Signed)
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 The patient's last visit with [name] was on 10/09/18. The pt reports that she is doing well overall.  The pt reports that she will be having a permanent stent placed in her liver on  11/30/18. She denies any abdominal pains, however she does endorse that her hemorrhoids have been bleeding some recently. She denies any problems tolerating maintenance Nivolumab. She endorses good energy levels overall.  Lab results today (10/22/18) of CBC w/diff and CMP is as follows: all values are WNL except for HGB at 11.4, HCT at 35.6, Monocytes abs at 1.1k, CO2 at 20, Glucose at 130, Creatinine at 1.31, Calcium at 7.9, Total Protein at 6.3, AST at 12, GFR at 39.  On review of systems, pt reports stable energy levels, hemorrhoidal bleeding, and denies concerns for infections, abdominal pains, leg swelling, nasal symptoms, allergies, and any other symptoms.    MEDICAL HISTORY:    Hypertension Dyslipidemia Osteoarthritis Ex-smoker Coronary artery disease Thyroid disorder-was apparently on levothyroxine 25 g daily which has subsequently been discontinued . 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 Earley Brooke Colonoscopy 03/2016 Dr. Earley Brooke  SOCIAL HISTORY: Social History   Socioeconomic History  . Marital status: Married    Spouse  name: Not on file  . Number of children: 3  . Years of education: Not on file  . Highest education level: Not on file  Occupational History  . Occupation: retired  Scientific laboratory technician  . Financial resource strain: Not on file  . Food insecurity    Worry: Not on file    Inability: Not on file  . Transportation needs    Medical: Not on file    Non-medical: Not on file  Tobacco Use  . Smoking status: Former Smoker    Packs/day: 1.00    Years: 8.00    Pack years: 8.00    Types: Cigarettes    Quit date: 06/18/1964    Years since quitting: 54.3  . Smokeless tobacco: Never Used  Substance and Sexual Activity  . Alcohol use: No  . Drug use: No  . Sexual activity: Not on file  Lifestyle  . Physical activity    Days per week: Not on file    Minutes per session: Not on file  . Stress: Not on file  Relationships  . Social Herbalist on phone: Not on file    Gets together: Not on file    Attends religious service: Not on file    Active member of club or organization: Not on file    Attends meetings of clubs or organizations: Not on file    Relationship status: Not on file  . Intimate partner violence    Fear of current or ex partner: Not on file    Emotionally abused: Not on file    Physically abused: Not on file  Forced sexual activity: Not on file  Other Topics Concern  . Not on file  Social History Narrative   Married.  Three children   Patient lives in [place] Works as a Solicitor part-time Ex-smoker quit long time ago In 1961 . Smoked 2 packs per day for 8 years prior to that .  or a lot of stress since her daughter is also getting treated for stage IV uterine cancer.  FAMILY HISTORY:  Mother deceased Father with asthma and emphysema died at 20 years with the MI. Brother deceased of heart disease   ALLERGIES:  is allergic to adhesive [tape]; gabapentin; nsaids; and statins.  MEDICATIONS:  Current Outpatient Medications  Medication Sig  Dispense Refill  . acetaminophen (TYLENOL) 325 MG tablet Take 2 tablets (650 mg total) by mouth every 6 (six) hours as needed for mild pain. (Patient not taking: Reported on 09/23/2018)    . amLODipine (NORVASC) 5 MG tablet Take 5 mg by mouth daily.   6  . atorvastatin (LIPITOR) 40 MG tablet Take 40 mg by mouth at bedtime.     . bisacodyl (DULCOLAX) 5 MG EC tablet Take 5 mg by mouth at bedtime.    . Calcium 600-200 MG-UNIT tablet Take 1 tablet by mouth daily after breakfast.     . carvedilol (COREG) 6.25 MG tablet Take 6.25 mg by mouth 2 (two) times daily.  6  . clobetasol ointment (TEMOVATE) 1.27 % Apply 1 application topically 2 (two) times daily as needed (lichen sclerosis).     Marland Kitchen docusate sodium (COLACE) 100 MG capsule Take 100 mg by mouth at bedtime.    . hydrocortisone (CORTEF) 10 MG tablet Take 5-10 mg by mouth See admin instructions. Take 1 tablet (10 mg) by mouth in the morning & 1/2 tablet (5 mg) by mouth in the evening.    Marland Kitchen levothyroxine (SYNTHROID) 75 MCG tablet Take 75 mcg by mouth daily before breakfast.     . lidocaine-prilocaine (EMLA) cream Apply to affected area once (Patient taking differently: Apply 1 application topically daily as needed (prior to port access). ) 30 g 3  . Nivolumab (OPDIVO IV) Inject 1 Dose into the vein every 14 (fourteen) days. Nord    . omeprazole (PRILOSEC) 20 MG capsule Take 1 capsule (20 mg total) by mouth 2 (two) times daily. 60 capsule 2  . oxyCODONE (OXY IR/ROXICODONE) 5 MG immediate release tablet Take 1 tablet (5 mg total) by mouth every 6 (six) hours as needed for severe pain. (Patient taking differently: Take 10 mg by mouth every 6 (six) hours as needed for severe pain. ) 15 tablet 0  . polyethylene glycol powder (MIRALAX) 17 GM/SCOOP powder Start taking 1 capful 3 times a day. Slowly cut back as needed until you have normal bowel movements. 255 g 0  . repaglinide (PRANDIN) 0.5 MG tablet Take 0.5 mg by mouth 2 (two) times daily before a  meal.     No current facility-administered medications for this visit.     REVIEW OF SYSTEMS:    A 10+ POINT REVIEW OF SYSTEMS WAS OBTAINED including neurology, dermatology, psychiatry, cardiac, respiratory, lymph, extremities, GI, GU, Musculoskeletal, constitutional, breasts, reproductive, HEENT.  All pertinent positives are noted in the HPI.  All others are negative.  PHYSICAL EXAMINATION:  ECOG PERFORMANCE STATUS: 1 - Symptomatic but completely ambulatory  Vitals:   10/22/18 0943  BP: (!) 148/66  Pulse: 78  Resp: 18  Temp: 98 F (36.7 C)  SpO2: 100%  Filed Weights   10/22/18 0943  Weight: 129 lb 11.2 oz (58.8 kg)   .Body mass index is 26.2 kg/m.   GENERAL:alert, in no acute distress and comfortable SKIN: no acute rashes, no significant lesions EYES: conjunctiva are pink and non-injected, sclera anicteric OROPHARYNX: MMM, no exudates, no oropharyngeal erythema or ulceration NECK: supple, no JVD LYMPH:  no palpable lymphadenopathy in the cervical, axillary or inguinal regions LUNGS: clear to auscultation b/l with normal respiratory effort HEART: regular rate & rhythm ABDOMEN:  normoactive bowel sounds , non tender, not distended. No palpable hepatosplenomegaly.  Extremity: no pedal edema PSYCH: alert & oriented x 3 with fluent speech NEURO: no focal motor/sensory deficits   LABORATORY DATA:  I have reviewed the data as listed  . CBC Latest Ref Rng & Units 10/22/2018 10/09/2018 09/25/2018  WBC 4.0 - 10.5 K/uL 9.2 9.9 8.7  Hemoglobin 12.0 - 15.0 g/dL 11.4(L) 12.2 11.4(L)  Hematocrit 36.0 - 46.0 % 35.6(L) 38.5 36.2  Platelets 150 - 400 K/uL 224 274 260    CBC    Component Value Date/Time   WBC 9.2 10/22/2018 0816   RBC 3.94 10/22/2018 0816   HGB 11.4 (L) 10/22/2018 0816   HGB 11.4 (L) 09/11/2018 0900   HGB 11.2 (L) 04/04/2017 0830   HCT 35.6 (L) 10/22/2018 0816   HCT 35.2 04/04/2017 0830   PLT 224 10/22/2018 0816   PLT 277 09/11/2018 0900   PLT 250 04/04/2017  0830   MCV 90.4 10/22/2018 0816   MCV 107.6 (H) 04/04/2017 0830   MCH 28.9 10/22/2018 0816   MCHC 32.0 10/22/2018 0816   RDW 13.8 10/22/2018 0816   RDW 14.0 04/04/2017 0830   LYMPHSABS 1.3 10/22/2018 0816   LYMPHSABS 1.6 04/04/2017 0830   MONOABS 1.1 (H) 10/22/2018 0816   MONOABS 0.4 04/04/2017 0830   EOSABS 0.2 10/22/2018 0816   EOSABS 0.1 04/04/2017 0830   BASOSABS 0.1 10/22/2018 0816   BASOSABS 0.0 04/04/2017 0830     . CMP Latest Ref Rng & Units 10/22/2018 10/09/2018 09/28/2018  Glucose 70 - 99 mg/dL 130(H) 145(H) -  BUN 8 - 23 mg/dL 22 24(H) -  Creatinine 0.44 - 1.00 mg/dL 1.31(H) 1.45(H) -  Sodium 135 - 145 mmol/L 140 135 -  Potassium 3.5 - 5.1 mmol/L 4.1 4.6 -  Chloride 98 - 111 mmol/L 110 104 -  CO2 22 - 32 mmol/L 20(L) 22 -  Calcium 8.9 - 10.3 mg/dL 7.9(L) 9.6 -  Total Protein 6.5 - 8.1 g/dL 6.3(L) 6.7 6.7  Total Bilirubin 0.3 - 1.2 mg/dL 0.5 0.5 0.4  Alkaline Phos 38 - 126 U/L 71 102 111  AST 15 - 41 U/L 12(L) 19 19  ALT 0 - 44 U/L 14 52(H) 63(H)   B12 level 299 (OSH)-->455  . Lab Results  Component Value Date   LDH 136 03/13/2018        08/27/18 Surgical Pathology from Cholecystectomy:         RADIOGRAPHIC STUDIES: I have personally reviewed the radiological images as listed and agreed with the findings in the report. Nm Pet Image Restag (ps) Skull Base To Thigh  Result Date: 01/03/2017 IMPRESSION: 1. There is a new lesion in the hepatic dome which is FDG avid and low in attenuation on CT imaging consistent with metastatic disease. Another region of uptake in the left hepatic lobe posteriorly demonstrates no CT correlate. A second subtle metastasis is not excluded on today's study. An MRI could better assess for other hepatic  metastases if clinically warranted. 2. New metastatic lesion in the left side of T8. 3. New pulmonary nodule in the right upper lobe. This is too small to characterize but suspicious. Recommend attention on follow-up. No FDG avid  nodules or other enlarging nodules. 4. The uptake at the previous left renal artery has almost resolved in the interval and is favored to be post therapeutic. Recommend attention on follow-up. 5. The metastasis in the posterior right hilum seen previously has resolved. Electronically Signed   By: Dorise Bullion III M.D   On: 01/03/2017 11:16   .Dg Abd Acute 2+v W 1v Chest  Result Date: 09/23/2018 CLINICAL DATA:  Abdominal discomfort. EXAM: DG ABDOMEN ACUTE W/ 1V CHEST COMPARISON:  08/30/2018.  09/18/2018. FINDINGS: There is a well-positioned right-sided Port-A-Cath in place. The lungs are essentially clear with no evidence of a pneumothorax or large pleural effusion. There is no acute osseous abnormality detected. There is a moderate to large amount of stool throughout the colon. Vascular calcifications are noted. Plastic biliary stents are again noted. Or there is a tubular contrast filled structure located in the patient's right hemipelvis which is favored to represent a contrast filled appendix. IMPRESSION: 1. No acute cardiopulmonary process. 2. Nonobstructive bowel gas pattern. 3. Moderate amount of stool throughout the colon. 4. Plastic biliary stents are again noted. Electronically Signed   By: Constance Holster M.D.   On: 09/23/2018 02:37    ASSESSMENT & PLAN:    78 y.o. 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 PET/CT 06/19/2017 -- consistent with partial metabolic response to treatment.  Rt adrenal gland bx - showed clear cell RCC  S/p CYtoreductive left radical nephrectomy and left adrenal gland resection on 08/01/2016 by Dr Alinda Money.  10/16/17 PET/CT revealed Continued improvement, with the T7 metastatic lesion no longer significantly hypermetabolic. The previous right lower lobe pulmonary nodule is even less apparent, perhaps about 2 mm in diameter today;  given that this measured 6 mm on 03/28/2017 this probably represents an effectively treated metastatic lesion. Currently no appreciable hypermetabolic activity is identified to suggest active malignancy. Distended gallbladder with gallbladder wall thickening and gallstones. Correlate clinically in assessing for cholecystitis. Small but abnormal amount of free pelvic fluid, nonspecific. Other imaging findings of potential clinical significance: Chronic ethmoid sinusitis. Aortic Atherosclerosis. Stable 5 mm right middle lobe pulmonary nodule, not hypermetabolic but below sensitive PET-CT size thresholds. Left nephrectomy. Notable pelvic floor laxity with cystocele. Chronic bilateral Sacroiliitis.  04/03/18 PET/CT revealed No evidence for new or progressive hypermetabolic disease on today's study to suggest metastatic progression. 2. Stable appearance of the T7 metastatic lesion without Hypermetabolism. 3. Tiny focus of FDG accumulation identified along the skin of the low right inguinal fold. No associated lesion evident on CT. This may be related to urinary contaminant. 4. Cholelithiasis with similar appearance of diffuse gallbladder wall thickening. 5. Stable 5 mm right middle lobe pulmonary nodule. 6.  Aortic Atherosclerois. 7. Diffuse colonic diverticulosis.   09/18/18 CT A/P revealed "Common duct stent in place. Improvement to resolution in biliary duct dilatation compared to 08/29/2018. Interval cholecystectomy without acute complication. 2. Left nephrectomy, without evidence of metastatic disease. 3.  Tiny hiatal hernia. 4. Coronary artery atherosclerosis. Aortic Atherosclerosis. 5. Mild limitations secondary to lack of IV contrast. 6. Marked pelvic floor laxity with cystocele and rectal prolapse".  #2 b/l adrenal metastases from [place] s/p left adrenalectomy with adrenal  insuff - follows with Dr Buddy Duty.  #3 Small pulmonary lesions -- 10/16/17 PET/CT showed pulmonary less apparent than 03/28/17 PET/CT,  decreasing from 68mm to 33mm diameter.   MRI brain shows no evidence of metastatic disease  #4 T7/8 Bone metastases - received Xgeva every 4 weeks from May 2018 to June 2019. 10/16/17 PET/CT revealed improvement with T7 metastatic lesion no longer significantly hypermetabolic.  -on Marchelle Folks  #5 ?liver mets- rpt PET/CT from 10/16/2017 shows no overt evidence of metastatic disease in the liver.  #6 Grade 1 Nausea - improved and intermittent. Hasnt used her anti-emetic as instructed so some nausea and decreased po intake.  #7 Grade 1 Diarrhea - resolved  #8 Hyponatremia -  resolved with sodium at 137 - likely related to some element of adrenal insufficiency, diarrhea, limited by mouth intake. Primarily solute free fluid intake.   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 Buddy Duty  #14 HTN - ?control. Patient tends to be anxious and has higher blood pressures in the clinic. She can have increased blood pressure from Sutent as well. Plan: -continued close f/u with her PCP /cardiology regarding the many elements necessary for her care that are not directly related to her oncology care. -ACEI held due to AKI and hyperkalemia - following with cardiology to mx this. Has been started on Amlodipine instead.   #15 Grade 1 mucositis- resolved -Advised the patient to continue use of Magic Mouthwash to aid with nutrition  #16 Newly diagnosed ?CAD - positive cardiac nuclear stress test Following with cardiology and nephrology ? Need for pre-operative cardiac cath  #17 Choledocholithiasis and cholelithiasis 07/10/18 US Abdomen revealed Biliary duct dilatation with 8 mm calculus at the level of the ampulla in the distal common bile duct. 2. Cholelithiasis. No gallbladder wall thickening or pericholecystic fluid. 3. Appearance of the liver raises concern for underlying hepatic cirrhosis. No focal liver lesions are demonstrable. 4.  Left kidney absent. 5.  Small cysts in right kidney. 6.  Aortic Atherosclerosis.  S/p ERCP on 07/23/18 with Dr. Valarie Merino Mansouraty  09/02/18 Cytopathology from ERCP was suspicious for malignant cells in bile duct, but was not definitive, other two findings were benign.  PLAN:  -Discussed pt labwork today, 10/22/18; blood counts and chemistries are stable, including liver functions. -The pt has no prohibitive toxicities from continuing her next cycle of maintenance Nivolumab, every 2 weeks, at this time. -Will aim to repeat PET/CT in the next 2-3 months, sooner for new symptoms -Pt having sludge intermittently blocking her bile duck? would defer question of Ursodiol use to GI -Discussed that if the pt were to actually have a primary bile duct neoplasm, there would be major surgery required and given the significance of these considerations I will speak with pathologist in the interim -Continue follow up with endocrinology -Recommend using Senna or Miralax for constipation -Continue with  OTC Lidocaine patch for lower back pain  -Recommend that the pt keep well moisturized, suspecting that patient does have dry skin -Hydroxyzine as well for itching -Continue Xgeva every 4 weeks -Recommended that the pt continue to eat well, drink at least 48-64 oz of water each day, and walk 20-30  minutes each day.  -Will see the pt back in 4 weeks    Followup with labs and next dose of Nivolumab every 2 weeks - plz schedule next 4 doses MD visit in 4 weeks and then every 4 weeks with every other treatment   All of the patients questions were answered with apparent satisfaction. The patient knows to call the clinic with any problems, questions or concerns.  The total time spent in the appt was 15 minutes and more than 50% was on counseling and direct patient cares.   Sullivan Lone MD [place] AAHIVMS [facility] Hematology/Oncology Physician [facility]  (Office):       [phone] (Work cell):  [phone] (Fax):           [phone]  I, Baldwin [place], am acting as a scribe for Dr. Sullivan Lone.   .I have reviewed the above documentation for accuracy and completeness, and I agree with the above. Brunetta Genera MD

## 2018-10-21 NOTE — Telephone Encounter (Signed)
Pt scheduled for ERCP 11/30/2018 [facility] @ 9:45am. Instructions mailed and pt understands that once she goes for Covid  Testing she must be quarantine until procedure.

## 2018-10-22 ENCOUNTER — Inpatient Hospital Stay (HOSPITAL_BASED_OUTPATIENT_CLINIC_OR_DEPARTMENT_OTHER): Payer: Medicare Other | Admitting: Hematology

## 2018-10-22 ENCOUNTER — Other Ambulatory Visit: Payer: Self-pay

## 2018-10-22 ENCOUNTER — Inpatient Hospital Stay: Payer: Medicare Other

## 2018-10-22 ENCOUNTER — Inpatient Hospital Stay: Payer: Medicare Other | Attending: Hematology

## 2018-10-22 VITALS — BP 148/66 | HR 78 | Temp 98.0°F | Resp 18 | Ht 59.0 in | Wt 129.7 lb

## 2018-10-22 DIAGNOSIS — C7972 Secondary malignant neoplasm of left adrenal gland: Secondary | ICD-10-CM | POA: Insufficient documentation

## 2018-10-22 DIAGNOSIS — E039 Hypothyroidism, unspecified: Secondary | ICD-10-CM | POA: Diagnosis not present

## 2018-10-22 DIAGNOSIS — Z95828 Presence of other vascular implants and grafts: Secondary | ICD-10-CM

## 2018-10-22 DIAGNOSIS — I340 Nonrheumatic mitral (valve) insufficiency: Secondary | ICD-10-CM | POA: Insufficient documentation

## 2018-10-22 DIAGNOSIS — I700 Atherosclerosis of aorta: Secondary | ICD-10-CM

## 2018-10-22 DIAGNOSIS — I129 Hypertensive chronic kidney disease with stage 1 through stage 4 chronic kidney disease, or unspecified chronic kidney disease: Secondary | ICD-10-CM

## 2018-10-22 DIAGNOSIS — N8110 Cystocele, unspecified: Secondary | ICD-10-CM | POA: Diagnosis not present

## 2018-10-22 DIAGNOSIS — Z79899 Other long term (current) drug therapy: Secondary | ICD-10-CM | POA: Insufficient documentation

## 2018-10-22 DIAGNOSIS — K449 Diaphragmatic hernia without obstruction or gangrene: Secondary | ICD-10-CM | POA: Diagnosis not present

## 2018-10-22 DIAGNOSIS — M461 Sacroiliitis, not elsewhere classified: Secondary | ICD-10-CM | POA: Diagnosis not present

## 2018-10-22 DIAGNOSIS — N189 Chronic kidney disease, unspecified: Secondary | ICD-10-CM | POA: Insufficient documentation

## 2018-10-22 DIAGNOSIS — Z5112 Encounter for antineoplastic immunotherapy: Secondary | ICD-10-CM | POA: Insufficient documentation

## 2018-10-22 DIAGNOSIS — C7971 Secondary malignant neoplasm of right adrenal gland: Secondary | ICD-10-CM

## 2018-10-22 DIAGNOSIS — I252 Old myocardial infarction: Secondary | ICD-10-CM | POA: Diagnosis not present

## 2018-10-22 DIAGNOSIS — C7800 Secondary malignant neoplasm of unspecified lung: Secondary | ICD-10-CM

## 2018-10-22 DIAGNOSIS — I2510 Atherosclerotic heart disease of native coronary artery without angina pectoris: Secondary | ICD-10-CM | POA: Insufficient documentation

## 2018-10-22 DIAGNOSIS — E538 Deficiency of other specified B group vitamins: Secondary | ICD-10-CM | POA: Insufficient documentation

## 2018-10-22 DIAGNOSIS — E785 Hyperlipidemia, unspecified: Secondary | ICD-10-CM | POA: Diagnosis not present

## 2018-10-22 DIAGNOSIS — C642 Malignant neoplasm of left kidney, except renal pelvis: Secondary | ICD-10-CM

## 2018-10-22 DIAGNOSIS — K8020 Calculus of gallbladder without cholecystitis without obstruction: Secondary | ICD-10-CM | POA: Diagnosis not present

## 2018-10-22 DIAGNOSIS — K5730 Diverticulosis of large intestine without perforation or abscess without bleeding: Secondary | ICD-10-CM | POA: Insufficient documentation

## 2018-10-22 DIAGNOSIS — Z7189 Other specified counseling: Secondary | ICD-10-CM

## 2018-10-22 DIAGNOSIS — M1990 Unspecified osteoarthritis, unspecified site: Secondary | ICD-10-CM

## 2018-10-22 DIAGNOSIS — C7951 Secondary malignant neoplasm of bone: Secondary | ICD-10-CM | POA: Insufficient documentation

## 2018-10-22 DIAGNOSIS — E2740 Unspecified adrenocortical insufficiency: Secondary | ICD-10-CM

## 2018-10-22 DIAGNOSIS — C787 Secondary malignant neoplasm of liver and intrahepatic bile duct: Secondary | ICD-10-CM

## 2018-10-22 DIAGNOSIS — E440 Moderate protein-calorie malnutrition: Secondary | ICD-10-CM

## 2018-10-22 DIAGNOSIS — E1122 Type 2 diabetes mellitus with diabetic chronic kidney disease: Secondary | ICD-10-CM

## 2018-10-22 DIAGNOSIS — E871 Hypo-osmolality and hyponatremia: Secondary | ICD-10-CM | POA: Insufficient documentation

## 2018-10-22 DIAGNOSIS — Z87891 Personal history of nicotine dependence: Secondary | ICD-10-CM | POA: Insufficient documentation

## 2018-10-22 LAB — CBC WITH DIFFERENTIAL/PLATELET
Abs Immature Granulocytes: 0.07 10*3/uL (ref 0.00–0.07)
Basophils Absolute: 0.1 10*3/uL (ref 0.0–0.1)
Basophils Relative: 1 %
Eosinophils Absolute: 0.2 10*3/uL (ref 0.0–0.5)
Eosinophils Relative: 3 %
HCT: 35.6 % — ABNORMAL LOW (ref 36.0–46.0)
Hemoglobin: 11.4 g/dL — ABNORMAL LOW (ref 12.0–15.0)
Immature Granulocytes: 1 %
Lymphocytes Relative: 14 %
Lymphs Abs: 1.3 10*3/uL (ref 0.7–4.0)
MCH: 28.9 pg (ref 26.0–34.0)
MCHC: 32 g/dL (ref 30.0–36.0)
MCV: 90.4 fL (ref 80.0–100.0)
Monocytes Absolute: 1.1 10*3/uL — ABNORMAL HIGH (ref 0.1–1.0)
Monocytes Relative: 12 %
Neutro Abs: 6.4 10*3/uL (ref 1.7–7.7)
Neutrophils Relative %: 69 %
Platelets: 224 10*3/uL (ref 150–400)
RBC: 3.94 MIL/uL (ref 3.87–5.11)
RDW: 13.8 % (ref 11.5–15.5)
WBC: 9.2 10*3/uL (ref 4.0–10.5)
nRBC: 0 % (ref 0.0–0.2)

## 2018-10-22 LAB — CMP (CANCER CENTER ONLY)
ALT: 14 U/L (ref 0–44)
AST: 12 U/L — ABNORMAL LOW (ref 15–41)
Albumin: 3.6 g/dL (ref 3.5–5.0)
Alkaline Phosphatase: 71 U/L (ref 38–126)
Anion gap: 10 (ref 5–15)
BUN: 22 mg/dL (ref 8–23)
CO2: 20 mmol/L — ABNORMAL LOW (ref 22–32)
Calcium: 7.9 mg/dL — ABNORMAL LOW (ref 8.9–10.3)
Chloride: 110 mmol/L (ref 98–111)
Creatinine: 1.31 mg/dL — ABNORMAL HIGH (ref 0.44–1.00)
GFR, Est AFR Am: 46 mL/min — ABNORMAL LOW (ref >60)
GFR, Estimated: 39 mL/min — ABNORMAL LOW (ref >60)
Glucose, Bld: 130 mg/dL — ABNORMAL HIGH (ref 70–99)
Potassium: 4.1 mmol/L (ref 3.5–5.1)
Sodium: 140 mmol/L (ref 135–145)
Total Bilirubin: 0.5 mg/dL (ref 0.3–1.2)
Total Protein: 6.3 g/dL — ABNORMAL LOW (ref 6.5–8.1)

## 2018-10-22 MED ORDER — HEPARIN SOD (PORK) LOCK FLUSH 100 UNIT/ML IV SOLN
500.0000 [IU] | Freq: Once | INTRAVENOUS | Status: AC | PRN
  Administered 2018-10-22: 500 [IU]
  Filled 2018-10-22: qty 5

## 2018-10-22 MED ORDER — SODIUM CHLORIDE 0.9 % IV SOLN
240.0000 mg | Freq: Once | INTRAVENOUS | Status: AC
  Administered 2018-10-22: 240 mg via INTRAVENOUS
  Filled 2018-10-22: qty 24

## 2018-10-22 MED ORDER — SODIUM CHLORIDE 0.9 % IV SOLN
Freq: Once | INTRAVENOUS | Status: AC
  Administered 2018-10-22: 10:00:00 via INTRAVENOUS
  Filled 2018-10-22: qty 250

## 2018-10-22 MED ORDER — SODIUM CHLORIDE 0.9% FLUSH
10.0000 mL | Freq: Once | INTRAVENOUS | Status: AC
  Administered 2018-10-22: 10 mL
  Filled 2018-10-22: qty 10

## 2018-10-22 MED ORDER — SODIUM CHLORIDE 0.9% FLUSH
10.0000 mL | INTRAVENOUS | Status: DC | PRN
  Administered 2018-10-22: 10 mL
  Filled 2018-10-22: qty 10

## 2018-10-22 NOTE — Patient Instructions (Signed)
[facility] Discharge Instructions for Patients Receiving Chemotherapy  Today you received the following chemotherapy agents: Nivolumab (Opdivo)  To help prevent nausea and vomiting after your treatment, we encourage you to take your nausea medication as directed.   If you develop nausea and vomiting that is not controlled by your nausea medication, call the clinic.   BELOW ARE SYMPTOMS THAT SHOULD BE REPORTED IMMEDIATELY:  *FEVER GREATER THAN 100.5 F  *CHILLS WITH OR WITHOUT FEVER  NAUSEA AND VOMITING THAT IS NOT CONTROLLED WITH YOUR NAUSEA MEDICATION  *UNUSUAL SHORTNESS OF BREATH  *UNUSUAL BRUISING OR BLEEDING  TENDERNESS IN MOUTH AND THROAT WITH OR WITHOUT PRESENCE OF ULCERS  *URINARY PROBLEMS  *BOWEL PROBLEMS  UNUSUAL RASH Items with * indicate a potential emergency and should be followed up as soon as possible.  Feel free to call the clinic should you have any questions or concerns. The clinic phone number is [phone].  Please show the CHEMO ALERT CARD at check-in to the Emergency Department and triage nurse.  Coronavirus (COVID-19) Are you at risk?  Are you at risk for the Coronavirus (COVID-19)?  To be considered HIGH RISK for Coronavirus (COVID-19), you have to meet the following criteria:  . Traveled to [place], [place], [name], [place] or [place]; or in the [place] to [place], [place], [place], or [place]; and have fever, cough, and shortness of breath within the last 2 weeks of travel OR . Been in close contact with a person diagnosed with COVID-19 within the last 2 weeks and have fever, cough, and shortness of breath . IF YOU DO NOT MEET THESE CRITERIA, YOU ARE CONSIDERED LOW RISK FOR COVID-19.  What to do if you are HIGH RISK for COVID-19?  . If you are having a medical emergency, call 911. . Seek medical care right away. Before you go to a doctor's office, urgent care or emergency department, call ahead and tell them about  your recent travel, contact with someone diagnosed with COVID-19, and your symptoms. You should receive instructions from your physician's office regarding next steps of care.  . When you arrive at healthcare provider, tell the healthcare staff immediately you have returned from visiting [place], [place], [place], [place] or [name]; or traveled in the [place] to [place], [place], [place], or [place]; in the last two weeks or you have been in close contact with a person diagnosed with COVID-19 in the last 2 weeks.   . Tell the health care staff about your symptoms: fever, cough and shortness of breath. . After you have been seen by a medical provider, you will be either: o Tested for (COVID-19) and discharged home on quarantine except to seek medical care if symptoms worsen, and asked to  - Stay home and avoid contact with others until you get your results (4-5 days)  - Avoid travel on public transportation if possible (such as bus, train, or airplane) or o Sent to the Emergency Department by EMS for evaluation, COVID-19 testing, and possible admission depending on your condition and test results.  What to do if you are LOW RISK for COVID-19?  Reduce your risk of any infection by using the same precautions used for avoiding the common cold or flu:  . Wash your hands often with soap and warm water for at least 20 seconds.  If soap and water are not readily available, use an alcohol-based hand sanitizer with at least 60% alcohol.  . If coughing or   sneezing, cover your mouth and nose by coughing or sneezing into the elbow areas of your shirt or coat, into a tissue or into your sleeve (not your hands). . Avoid shaking hands with others and consider head nods or verbal greetings only. . Avoid touching your eyes, nose, or mouth with unwashed hands.  . Avoid close contact with people who are sick. . Avoid places or events with large numbers of people in one location, like concerts or sporting  events. . Carefully consider travel plans you have or are making. . If you are planning any travel outside or inside the [place], visit the CDC's Travelers' Health webpage for the latest health notices. . If you have some symptoms but not all symptoms, continue to monitor at home and seek medical attention if your symptoms worsen. . If you are having a medical emergency, call 911.   ADDITIONAL HEALTHCARE OPTIONS FOR PATIENTS  [place] [facility] / e-Visit: [email or web address]         [facility]: [phone]  [facility]: [phone]                   [facility]: [phone]   

## 2018-10-26 ENCOUNTER — Telehealth: Payer: Self-pay | Admitting: Hematology

## 2018-10-26 NOTE — Telephone Encounter (Signed)
Scheduled appt per 7/2 los. °

## 2018-10-28 LAB — HEPATITIS B CORE ANTIBODY, TOTAL: Hep B Core Total Ab: NEGATIVE

## 2018-10-28 LAB — HEPATITIS B SURFACE ANTIGEN: Hepatitis B Surface Ag: NEGATIVE

## 2018-10-28 LAB — TISSUE TRANSGLUTAMINASE, IGA: Tissue Transglutaminase Ab, IgA: <2 U/mL (ref 0–3)

## 2018-10-28 LAB — HEPATITIS B SURFACE ANTIBODY,QUALITATIVE: Hep B S Ab: NONREACTIVE

## 2018-10-28 LAB — HEPATITIS C ANTIBODY: HCV Ab: 0.1 s/co ratio (ref 0.0–0.9)

## 2018-10-28 LAB — HEPATITIS A ANTIBODY, TOTAL: hep A Total Ab: NEGATIVE

## 2018-10-29 LAB — ANA: Anti Nuclear Antibody (ANA): NEGATIVE

## 2018-10-29 LAB — ANTI-SMOOTH MUSCLE ANTIBODY, IGG: F-Actin IgG: 6 Units (ref 0–19)

## 2018-10-29 LAB — MITOCHONDRIAL ANTIBODIES: Mitochondrial M2 Ab, IgG: <20 Units (ref 0.0–20.0)

## 2018-10-29 NOTE — Progress Notes (Signed)
HEMATOLOGY/ONCOLOGY CLINIC NOTE  Date of Service: 11/06/18    PCP: Kristine Linea MD (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 The patient's last visit with [name] was on 10/22/2018. The pt reports that she is doing well overall.  The pt reports that her tongue feels "a little raw" while eating. She tolerates sweets better than other foods. She was uncomfortable sitting at her appointment because of her hemorrhoids, which she has had for 50+ years. She has been having some watery diarrhea 3x per day recently, but it has been better for the past few days.  Lab results today (11/06/2018) of CBC w/diff and CMP is as follows: all values are WNL except for WBC at 10.9, HGB at 11.1, HCT 34.4, neutro Abs at 8.0, monocytes abs at 1.2, and abs immature granulocytes at 0.21. 11/06/2018 CMP reviewed. 11/06/2018 immature retic fract at 17.5  On review of systems, pt reports diarrhea, "raw feeling tongue" and denies belly pain and any other symptoms.   MEDICAL HISTORY:    Hypertension Dyslipidemia Osteoarthritis Ex-smoker Coronary artery disease Thyroid disorder-was apparently on levothyroxine 25 g daily which has subsequently been discontinued . 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 Earley Brooke Colonoscopy 03/2016 Dr. Earley Brooke  SOCIAL HISTORY: Social History   Socioeconomic History   Marital status: Married    Spouse name: Not on file   Number of children: 3   Years of  education: Not on file   Highest education level: Not on file  Occupational History   Occupation: retired  Scientist, product/process development strain: Not on file   Food insecurity    Worry: Not on file    Inability: Not on Lexicographer needs    Medical: Not on file    Non-medical: Not on file  Tobacco Use   Smoking status: Former Smoker    Packs/day: 1.00    Years: 8.00    Pack years: 8.00    Types: Cigarettes    Quit date: 06/18/1964    Years since quitting: 54.4   Smokeless tobacco: Never Used  Substance and Sexual Activity   Alcohol use: No   Drug use: No   Sexual activity: Not on file  Lifestyle   Physical activity    Days per week: Not on file    Minutes per session: Not on file   Stress: Not on file  Relationships   Social connections    Talks on phone: Not on file    Gets together: Not on file    Attends religious service: Not on file    Active member of club or organization: Not on file    Attends meetings of clubs or organizations: Not on file    Relationship status: Not on file   Intimate partner violence    Fear of current or ex partner: Not on file    Emotionally abused: Not on file    Physically abused: Not on file    Forced sexual activity: Not on file  Other Topics Concern   Not on file  Social History Narrative   Married.  Three children   Patient lives in [place] Works as a Solicitor part-time Ex-smoker quit long time ago In 1961 . Smoked 2 packs per day for 8 years prior to that .  or a lot of stress since her daughter is also getting treated for stage IV uterine cancer.  FAMILY HISTORY:  Mother deceased Father with asthma and emphysema died at 20 years with the MI. Brother deceased of heart disease   ALLERGIES:  is allergic to adhesive [tape]; gabapentin; nsaids; and statins.  MEDICATIONS:  Current Outpatient Medications  Medication Sig Dispense Refill   amLODipine (NORVASC) 5 MG tablet Take 5  mg by mouth daily.   6   atorvastatin (LIPITOR) 40 MG tablet Take 40 mg by mouth at bedtime.      carvedilol (COREG) 6.25 MG tablet Take 6.25 mg by mouth 2 (two) times daily.  6   clobetasol ointment (TEMOVATE) 2.42 % Apply 1 application topically 2 (two) times daily as needed (lichen sclerosis).      Denosumab (XGEVA [place]) Inject into the skin every 6 (six) weeks.     hydrocortisone (CORTEF) 10 MG tablet Take 5-10 mg by mouth See admin instructions. Take 1 tablet (10 mg) by mouth in the morning & 1/2 tablet (5 mg) by mouth in the evening.     levothyroxine (SYNTHROID) 75 MCG tablet Take 75 mcg by mouth daily before breakfast.      lidocaine (LIDODERM) 5 % Place 1 patch onto the skin daily. Remove & Discard patch within 12 hours or as directed by MD Using on Right Hip     lidocaine-prilocaine (EMLA) cream Apply to affected area once (Patient taking differently: Apply 1 application topically daily as needed (prior to port access). ) 30 g 3   Nivolumab (OPDIVO IV) Inject 1 Dose into the vein every 14 (fourteen) days. [place]     omeprazole (PRILOSEC) 20 MG capsule Take 1 capsule by mouth twice daily 60 capsule 0   oxyCODONE (OXY IR/ROXICODONE) 5 MG immediate release tablet Take 1 tablet (5 mg total) by mouth every 6 (six) hours as needed for severe pain. (Patient not taking: Reported on 10/22/2018) 15 tablet 0   repaglinide (PRANDIN) 0.5 MG tablet Take 0.5 mg by mouth 2 (two) times daily before a meal.     No current facility-administered medications for this visit.     REVIEW OF SYSTEMS:    A 10+ POINT REVIEW OF SYSTEMS WAS OBTAINED including neurology, dermatology, psychiatry, cardiac, respiratory, lymph, extremities, GI, GU, Musculoskeletal, constitutional, breasts, reproductive, HEENT.  All pertinent positives are noted in the HPI.  All others are negative.   PHYSICAL EXAMINATION:  ECOG PERFORMANCE STATUS: 1 - Symptomatic but completely ambulatory  Vitals:   11/06/18 0944   BP: (!) 147/71  Pulse: 69  Resp: 18  Temp: 98.9 F (37.2 C)  SpO2: 100%   Filed Weights   11/06/18 0944  Weight: 125 lb 6.4 oz (56.9 kg)   .Body mass index is 25.33 kg/m.   GENERAL:alert, in no acute distress and comfortable SKIN: no acute rashes, no significant lesions EYES: conjunctiva are pink and non-injected, sclera anicteric OROPHARYNX: MMM, no exudates, no oropharyngeal erythema or ulceration NECK: supple, no JVD LYMPH:  no palpable lymphadenopathy in the cervical, axillary or inguinal regions LUNGS: clear to auscultation b/l with normal respiratory effort HEART: regular rate & rhythm ABDOMEN:  normoactive bowel  sounds , non tender, not distended. No palpable hepatosplenomegaly.  Extremity: no pedal edema PSYCH: alert & oriented x 3 with fluent speech NEURO: no focal motor/sensory deficits   LABORATORY DATA:  I have reviewed the data as listed  . CBC Latest Ref Rng & Units 11/06/2018 10/22/2018 10/09/2018  WBC 4.0 - 10.5 K/uL 10.9(H) 9.2 9.9  Hemoglobin 12.0 - 15.0 g/dL 11.1(L) 11.4(L) 12.2  Hematocrit 36.0 - 46.0 % 34.4(L) 35.6(L) 38.5  Platelets 150 - 400 K/uL 341 224 274    CBC    Component Value Date/Time   WBC 10.9 (H) 11/06/2018 0905   RBC 3.87 11/06/2018 0905   RBC 3.87 11/06/2018 0905   HGB 11.1 (L) 11/06/2018 0905   HGB 11.4 (L) 09/11/2018 0900   HGB 11.2 (L) 04/04/2017 0830   HCT 34.4 (L) 11/06/2018 0905   HCT 35.2 04/04/2017 0830   PLT 341 11/06/2018 0905   PLT 277 09/11/2018 0900   PLT 250 04/04/2017 0830   MCV 88.9 11/06/2018 0905   MCV 107.6 (H) 04/04/2017 0830   MCH 28.7 11/06/2018 0905   MCHC 32.3 11/06/2018 0905   RDW 13.9 11/06/2018 0905   RDW 14.0 04/04/2017 0830   LYMPHSABS 1.2 11/06/2018 0905   LYMPHSABS 1.6 04/04/2017 0830   MONOABS 1.2 (H) 11/06/2018 0905   MONOABS 0.4 04/04/2017 0830   EOSABS 0.2 11/06/2018 0905   EOSABS 0.1 04/04/2017 0830   BASOSABS 0.0 11/06/2018 0905   BASOSABS 0.0 04/04/2017 0830     . CMP Latest  Ref Rng & Units 10/22/2018 10/09/2018 09/28/2018  Glucose 70 - 99 mg/dL 130(H) 145(H) -  BUN 8 - 23 mg/dL 22 24(H) -  Creatinine 0.44 - 1.00 mg/dL 1.31(H) 1.45(H) -  Sodium 135 - 145 mmol/L 140 135 -  Potassium 3.5 - 5.1 mmol/L 4.1 4.6 -  Chloride 98 - 111 mmol/L 110 104 -  CO2 22 - 32 mmol/L 20(L) 22 -  Calcium 8.9 - 10.3 mg/dL 7.9(L) 9.6 -  Total Protein 6.5 - 8.1 g/dL 6.3(L) 6.7 6.7  Total Bilirubin 0.3 - 1.2 mg/dL 0.5 0.5 0.4  Alkaline Phos 38 - 126 U/L 71 102 111  AST 15 - 41 U/L 12(L) 19 19  ALT 0 - 44 U/L 14 52(H) 63(H)   B12 level 299 (OSH)-->455  . Lab Results  Component Value Date   LDH 136 03/13/2018        08/27/18 Surgical Pathology from Cholecystectomy:         RADIOGRAPHIC STUDIES: I have personally reviewed the radiological images as listed and agreed with the findings in the report. Nm Pet Image Restag (ps) Skull Base To Thigh  Result Date: 01/03/2017 IMPRESSION: 1. There is a new lesion in the hepatic dome which is FDG avid and low in attenuation on CT imaging consistent with metastatic disease. Another region of uptake in the left hepatic lobe posteriorly demonstrates no CT correlate. A second subtle metastasis is not excluded on today's study. An MRI could better assess for other hepatic metastases if clinically warranted. 2. New metastatic lesion in the left side of T8. 3. New pulmonary nodule in the right upper lobe. This is too small to characterize but suspicious. Recommend attention on follow-up. No FDG avid nodules or other enlarging nodules. 4. The uptake at the previous left renal artery has almost resolved in the interval and is favored to be post therapeutic. Recommend attention on follow-up. 5. The metastasis in the posterior right hilum seen previously has resolved. Electronically  Signed   By: Dorise Bullion III M.D   On: 01/03/2017 11:16   .No results found.  ASSESSMENT & PLAN:    78 y.o. 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 PET/CT 06/19/2017 -- consistent with partial metabolic response to treatment.  Rt adrenal gland bx - showed clear cell RCC  S/p CYtoreductive left radical nephrectomy and left adrenal gland resection on 08/01/2016 by Dr Alinda Money.  10/16/17 PET/CT revealed Continued improvement, with the T7 metastatic lesion no longer significantly hypermetabolic. The previous right lower lobe pulmonary nodule is even less apparent, perhaps about 2 mm in diameter today; given that this measured 6 mm on 03/28/2017 this probably represents an effectively treated metastatic lesion. Currently no appreciable hypermetabolic activity is identified to suggest active malignancy. Distended gallbladder with gallbladder wall thickening and gallstones. Correlate clinically in assessing for cholecystitis. Small but abnormal amount of free pelvic fluid, nonspecific. Other imaging findings of potential clinical significance: Chronic ethmoid sinusitis. Aortic Atherosclerosis. Stable 5 mm right middle lobe pulmonary nodule, not hypermetabolic but below sensitive PET-CT size thresholds. Left nephrectomy. Notable pelvic floor laxity with cystocele. Chronic bilateral Sacroiliitis.  04/03/18 PET/CT revealed No evidence for new or progressive hypermetabolic disease on today's study to suggest metastatic progression. 2. Stable appearance of the T7 metastatic lesion without Hypermetabolism. 3. Tiny focus of FDG accumulation identified along the skin of the low right inguinal fold. No associated lesion evident on CT. This may be related to urinary contaminant. 4. Cholelithiasis with similar appearance of diffuse gallbladder wall thickening. 5. Stable 5 mm right middle lobe pulmonary nodule. 6.  Aortic Atherosclerois. 7. Diffuse colonic diverticulosis.   09/18/18 CT A/P revealed "Common duct stent in place. Improvement to resolution in  biliary duct dilatation compared to 08/29/2018. Interval cholecystectomy without acute complication. 2. Left nephrectomy, without evidence of metastatic disease. 3.  Tiny hiatal hernia. 4. Coronary artery atherosclerosis. Aortic Atherosclerosis. 5. Mild limitations secondary to lack of IV contrast. 6. Marked pelvic floor laxity with cystocele and rectal prolapse".  #2 b/l adrenal metastases from [place] s/p left adrenalectomy with adrenal insuff - follows with Dr Buddy Duty.  #3 Small pulmonary lesions -- 10/16/17 PET/CT showed pulmonary less apparent than 03/28/17 PET/CT, decreasing from 13mm to 31mm diameter.   MRI brain shows no evidence of metastatic disease  #4 T7/8 Bone metastases - received Xgeva every 4 weeks from May 2018 to June 2019. 10/16/17 PET/CT revealed improvement with T7 metastatic lesion no longer significantly hypermetabolic.  -on Marchelle Folks  #5 ?liver mets- rpt PET/CT from 10/16/2017 shows no overt evidence of metastatic disease in the liver.  #6 Grade 1 Nausea - improved and intermittent. Hasnt used her anti-emetic as instructed so some nausea and decreased po intake.  #7 Grade 1 Diarrhea - resolved  #8 Hyponatremia -  resolved with sodium at 137 - likely related to some element of adrenal insufficiency, diarrhea, limited by mouth intake. Primarily solute free fluid intake.   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 Buddy Duty  #14 HTN - ?control. Patient tends to be anxious and has higher blood pressures in the clinic. She can have increased blood pressure from Sutent as well. Plan: -continued close f/u with her PCP /cardiology regarding the many elements necessary for her care that are not directly related to her oncology care. -ACEI held due to AKI and hyperkalemia - following with cardiology to mx this. Has been started on Amlodipine instead.  #15 Grade 1 mucositis- resolved -Advised the patient to continue use of Magic Mouthwash to aid with nutrition  #16 Newly diagnosed ?CAD - positive cardiac nuclear stress test Following with cardiology and nephrology ? Need for pre-operative cardiac cath  #17 Choledocholithiasis and cholelithiasis 07/10/18 US Abdomen revealed Biliary duct dilatation with 8 mm calculus at the level of the ampulla in the distal common bile duct. 2. Cholelithiasis. No gallbladder wall thickening or pericholecystic fluid. 3. Appearance of the liver raises concern for underlying hepatic cirrhosis. No focal liver lesions are demonstrable. 4.  Left kidney absent. 5.  Small cysts in right kidney. 6.  Aortic Atherosclerosis.  S/p ERCP on 07/23/18 with Dr. Valarie Merino Mansouraty  09/02/18 Cytopathology from ERCP was suspicious for malignant cells in bile duct, but was not definitive, other two findings were benign.  PLAN:  A: -Discussed pt labwork today, 11/06/2018; CMP pending No indication of significant toxicity or RCC progression --- continue next cycle of Nivolumab -Stent change scheduled for 11/30/2018 -Patient asked about drinking wine at dinner, recommend that she saves it for between treatments.  -The pt has no prohibitive toxicities from continuing her next cycle of maintenance Nivolumab, every 2 weeks, at this time. -Pt having sludge intermittently  blocking her bile duct would defer question of Ursodiol use to GI -Continue follow up with endocrinology -Recommend using Senna or Miralax for constipation -Continue with OTC Lidocaine patch for lower back pain  -Continue Xgeva every 4 weeks -Recommended that the pt continue to eat well, drink at least 48-64 oz of water each day, and walk 20-30 minutes each day.  -Schedule repeat PET/CT for end of August    - Please schedule next 4 doses of Nivolumab with labs - MD visit only every other treatment (could cancel the extra appointments).    All of the patients questions were answered with apparent satisfaction. The patient knows to call the clinic with any problems, questions or concerns.  The total time spent in the appt was 20 minutes and more than 50% was on counseling and direct patient cares.   Sullivan Lone MD MS AAHIVMS [facility] Hematology/Oncology Physician [place]  (Office):       [phone] (Work cell):  [phone] (Fax):           [phone]  By signing my name below, I, De Burrs, attest that this documentation has been prepared under the direction and in the presence of Irene Limbo, Cloria Spring, MD. Electronically Signed: De Burrs, Medical Scribe. 11/06/18. 10:10 AM.  .I have reviewed the above documentation for accuracy and completeness, and I agree with the above. Brunetta Genera MD

## 2018-11-04 NOTE — Telephone Encounter (Addendum)
Returned pts call. Explained that it is required by the hospital that she be Covid- tested before procedures can be done, otherwise procedure will be cancelled.  Pt voiced understanding and she will keep appt for covid testing.

## 2018-11-04 NOTE — Telephone Encounter (Signed)
Pt would like to discuss why she needs to have another COVID test.  She stated that they are painful and is reluctant.

## 2018-11-06 ENCOUNTER — Inpatient Hospital Stay: Payer: Medicare Other

## 2018-11-06 ENCOUNTER — Inpatient Hospital Stay (HOSPITAL_BASED_OUTPATIENT_CLINIC_OR_DEPARTMENT_OTHER): Payer: Medicare Other | Admitting: Hematology

## 2018-11-06 ENCOUNTER — Telehealth: Payer: Self-pay | Admitting: Hematology

## 2018-11-06 ENCOUNTER — Other Ambulatory Visit: Payer: Self-pay

## 2018-11-06 VITALS — BP 147/71 | HR 69 | Temp 98.9°F | Resp 18 | Ht 59.0 in | Wt 125.4 lb

## 2018-11-06 DIAGNOSIS — I340 Nonrheumatic mitral (valve) insufficiency: Secondary | ICD-10-CM

## 2018-11-06 DIAGNOSIS — C7800 Secondary malignant neoplasm of unspecified lung: Secondary | ICD-10-CM | POA: Diagnosis not present

## 2018-11-06 DIAGNOSIS — Z95828 Presence of other vascular implants and grafts: Secondary | ICD-10-CM

## 2018-11-06 DIAGNOSIS — C7951 Secondary malignant neoplasm of bone: Secondary | ICD-10-CM

## 2018-11-06 DIAGNOSIS — I252 Old myocardial infarction: Secondary | ICD-10-CM

## 2018-11-06 DIAGNOSIS — E871 Hypo-osmolality and hyponatremia: Secondary | ICD-10-CM

## 2018-11-06 DIAGNOSIS — E2740 Unspecified adrenocortical insufficiency: Secondary | ICD-10-CM

## 2018-11-06 DIAGNOSIS — K449 Diaphragmatic hernia without obstruction or gangrene: Secondary | ICD-10-CM

## 2018-11-06 DIAGNOSIS — E785 Hyperlipidemia, unspecified: Secondary | ICD-10-CM

## 2018-11-06 DIAGNOSIS — E039 Hypothyroidism, unspecified: Secondary | ICD-10-CM

## 2018-11-06 DIAGNOSIS — C642 Malignant neoplasm of left kidney, except renal pelvis: Secondary | ICD-10-CM

## 2018-11-06 DIAGNOSIS — E1122 Type 2 diabetes mellitus with diabetic chronic kidney disease: Secondary | ICD-10-CM

## 2018-11-06 DIAGNOSIS — K8020 Calculus of gallbladder without cholecystitis without obstruction: Secondary | ICD-10-CM

## 2018-11-06 DIAGNOSIS — I700 Atherosclerosis of aorta: Secondary | ICD-10-CM

## 2018-11-06 DIAGNOSIS — E440 Moderate protein-calorie malnutrition: Secondary | ICD-10-CM

## 2018-11-06 DIAGNOSIS — Z5112 Encounter for antineoplastic immunotherapy: Secondary | ICD-10-CM

## 2018-11-06 DIAGNOSIS — C787 Secondary malignant neoplasm of liver and intrahepatic bile duct: Secondary | ICD-10-CM

## 2018-11-06 DIAGNOSIS — K5730 Diverticulosis of large intestine without perforation or abscess without bleeding: Secondary | ICD-10-CM

## 2018-11-06 DIAGNOSIS — M1990 Unspecified osteoarthritis, unspecified site: Secondary | ICD-10-CM

## 2018-11-06 DIAGNOSIS — Z7189 Other specified counseling: Secondary | ICD-10-CM

## 2018-11-06 DIAGNOSIS — M461 Sacroiliitis, not elsewhere classified: Secondary | ICD-10-CM

## 2018-11-06 DIAGNOSIS — Z87891 Personal history of nicotine dependence: Secondary | ICD-10-CM

## 2018-11-06 DIAGNOSIS — Z79899 Other long term (current) drug therapy: Secondary | ICD-10-CM

## 2018-11-06 DIAGNOSIS — C7971 Secondary malignant neoplasm of right adrenal gland: Secondary | ICD-10-CM

## 2018-11-06 DIAGNOSIS — N8110 Cystocele, unspecified: Secondary | ICD-10-CM

## 2018-11-06 DIAGNOSIS — E538 Deficiency of other specified B group vitamins: Secondary | ICD-10-CM

## 2018-11-06 DIAGNOSIS — I129 Hypertensive chronic kidney disease with stage 1 through stage 4 chronic kidney disease, or unspecified chronic kidney disease: Secondary | ICD-10-CM

## 2018-11-06 DIAGNOSIS — N189 Chronic kidney disease, unspecified: Secondary | ICD-10-CM

## 2018-11-06 DIAGNOSIS — C7972 Secondary malignant neoplasm of left adrenal gland: Secondary | ICD-10-CM

## 2018-11-06 DIAGNOSIS — I2510 Atherosclerotic heart disease of native coronary artery without angina pectoris: Secondary | ICD-10-CM

## 2018-11-06 LAB — CBC WITH DIFFERENTIAL/PLATELET
Abs Immature Granulocytes: 0.21 10*3/uL — ABNORMAL HIGH (ref 0.00–0.07)
Basophils Absolute: 0 10*3/uL (ref 0.0–0.1)
Basophils Relative: 0 %
Eosinophils Absolute: 0.2 10*3/uL (ref 0.0–0.5)
Eosinophils Relative: 2 %
HCT: 34.4 % — ABNORMAL LOW (ref 36.0–46.0)
Hemoglobin: 11.1 g/dL — ABNORMAL LOW (ref 12.0–15.0)
Immature Granulocytes: 2 %
Lymphocytes Relative: 11 %
Lymphs Abs: 1.2 10*3/uL (ref 0.7–4.0)
MCH: 28.7 pg (ref 26.0–34.0)
MCHC: 32.3 g/dL (ref 30.0–36.0)
MCV: 88.9 fL (ref 80.0–100.0)
Monocytes Absolute: 1.2 10*3/uL — ABNORMAL HIGH (ref 0.1–1.0)
Monocytes Relative: 11 %
Neutro Abs: 8 10*3/uL — ABNORMAL HIGH (ref 1.7–7.7)
Neutrophils Relative %: 74 %
Platelets: 341 10*3/uL (ref 150–400)
RBC: 3.87 MIL/uL (ref 3.87–5.11)
RDW: 13.9 % (ref 11.5–15.5)
WBC: 10.9 10*3/uL — ABNORMAL HIGH (ref 4.0–10.5)
nRBC: 0 % (ref 0.0–0.2)

## 2018-11-06 LAB — CMP (CANCER CENTER ONLY)
ALT: 47 U/L — ABNORMAL HIGH (ref 0–44)
AST: 37 U/L (ref 15–41)
Albumin: 3.2 g/dL — ABNORMAL LOW (ref 3.5–5.0)
Alkaline Phosphatase: 116 U/L (ref 38–126)
Anion gap: 10 (ref 5–15)
BUN: 12 mg/dL (ref 8–23)
CO2: 24 mmol/L (ref 22–32)
Calcium: 7.8 mg/dL — ABNORMAL LOW (ref 8.9–10.3)
Chloride: 107 mmol/L (ref 98–111)
Creatinine: 1.22 mg/dL — ABNORMAL HIGH (ref 0.44–1.00)
GFR, Est AFR Am: 50 mL/min — ABNORMAL LOW (ref >60)
GFR, Estimated: 43 mL/min — ABNORMAL LOW (ref >60)
Glucose, Bld: 140 mg/dL — ABNORMAL HIGH (ref 70–99)
Potassium: 3.2 mmol/L — ABNORMAL LOW (ref 3.5–5.1)
Sodium: 141 mmol/L (ref 135–145)
Total Bilirubin: 0.6 mg/dL (ref 0.3–1.2)
Total Protein: 6.3 g/dL — ABNORMAL LOW (ref 6.5–8.1)

## 2018-11-06 LAB — RETICULOCYTES
Immature Retic Fract: 17.5 % — ABNORMAL HIGH (ref 2.3–15.9)
RBC.: 3.87 MIL/uL (ref 3.87–5.11)
Retic Count, Absolute: 72 10*3/uL (ref 19.0–186.0)
Retic Ct Pct: 1.9 % (ref 0.4–3.1)

## 2018-11-06 MED ORDER — SODIUM CHLORIDE 0.9% FLUSH
10.0000 mL | INTRAVENOUS | Status: DC | PRN
  Administered 2018-11-06: 10 mL
  Filled 2018-11-06: qty 10

## 2018-11-06 MED ORDER — SODIUM CHLORIDE 0.9 % IV SOLN
Freq: Once | INTRAVENOUS | Status: AC
  Administered 2018-11-06: 10:00:00 via INTRAVENOUS
  Filled 2018-11-06: qty 250

## 2018-11-06 MED ORDER — SODIUM CHLORIDE 0.9 % IV SOLN
240.0000 mg | Freq: Once | INTRAVENOUS | Status: AC
  Administered 2018-11-06: 240 mg via INTRAVENOUS
  Filled 2018-11-06: qty 24

## 2018-11-06 MED ORDER — SODIUM CHLORIDE 0.9% FLUSH
10.0000 mL | Freq: Once | INTRAVENOUS | Status: AC
  Administered 2018-11-06: 10 mL
  Filled 2018-11-06: qty 10

## 2018-11-06 MED ORDER — DENOSUMAB 120 MG/1.7ML ~~LOC~~ SOLN
120.0000 mg | Freq: Once | SUBCUTANEOUS | Status: AC
  Administered 2018-11-06: 120 mg via SUBCUTANEOUS

## 2018-11-06 MED ORDER — DENOSUMAB 120 MG/1.7ML ~~LOC~~ SOLN
SUBCUTANEOUS | Status: AC
  Filled 2018-11-06: qty 1.7

## 2018-11-06 MED ORDER — HEPARIN SOD (PORK) LOCK FLUSH 100 UNIT/ML IV SOLN
500.0000 [IU] | Freq: Once | INTRAVENOUS | Status: AC | PRN
  Administered 2018-11-06: 500 [IU]
  Filled 2018-11-06: qty 5

## 2018-11-06 NOTE — Patient Instructions (Signed)
Coronavirus (COVID-19) Are you at risk?  Are you at risk for the Coronavirus (COVID-19)?  To be considered HIGH RISK for Coronavirus (COVID-19), you have to meet the following criteria:  . Traveled to [place], [place], [name], [place] or [place]; or in the [place] to [place], [place], [place], or [place]; and have fever, cough, and shortness of breath within the last 2 weeks of travel OR . Been in close contact with a person diagnosed with COVID-19 within the last 2 weeks and have fever, cough, and shortness of breath . IF YOU DO NOT MEET THESE CRITERIA, YOU ARE CONSIDERED LOW RISK FOR COVID-19.  What to do if you are HIGH RISK for COVID-19?  . If you are having a medical emergency, call 911. . Seek medical care right away. Before you go to a doctor's office, urgent care or emergency department, call ahead and tell them about your recent travel, contact with someone diagnosed with COVID-19, and your symptoms. You should receive instructions from your physician's office regarding next steps of care.  . When you arrive at healthcare provider, tell the healthcare staff immediately you have returned from visiting [place], [place], [place], [place] or [name]; or traveled in the [place] to [place], [place], [place], or [place]; in the last two weeks or you have been in close contact with a person diagnosed with COVID-19 in the last 2 weeks.   . Tell the health care staff about your symptoms: fever, cough and shortness of breath. . After you have been seen by a medical provider, you will be either: o Tested for (COVID-19) and discharged home on quarantine except to seek medical care if symptoms worsen, and asked to  - Stay home and avoid contact with others until you get your results (4-5 days)  - Avoid travel on public transportation if possible (such as bus, train, or airplane) or o Sent to the Emergency Department by EMS for evaluation, COVID-19 testing, and possible  admission depending on your condition and test results.  What to do if you are LOW RISK for COVID-19?  Reduce your risk of any infection by using the same precautions used for avoiding the common cold or flu:  . Wash your hands often with soap and warm water for at least 20 seconds.  If soap and water are not readily available, use an alcohol-based hand sanitizer with at least 60% alcohol.  . If coughing or sneezing, cover your mouth and nose by coughing or sneezing into the elbow areas of your shirt or coat, into a tissue or into your sleeve (not your hands). . Avoid shaking hands with others and consider head nods or verbal greetings only. . Avoid touching your eyes, nose, or mouth with unwashed hands.  . Avoid close contact with people who are sick. . Avoid places or events with large numbers of people in one location, like concerts or sporting events. . Carefully consider travel plans you have or are making. . If you are planning any travel outside or inside the [place], visit the CDC's Travelers' Health webpage for the latest health notices. . If you have some symptoms but not all symptoms, continue to monitor at home and seek medical attention if your symptoms worsen. . If you are having a medical emergency, call 911.   ADDITIONAL HEALTHCARE OPTIONS FOR PATIENTS  [facility] / e-Visit: [email or web address]         [facility]: [phone]  [facility]: [phone]                   [facility]: [phone]   [facility] Discharge Instructions for Patients Receiving Chemotherapy  Today you received the following chemotherapy agents Opdivo   To help prevent nausea and vomiting after your treatment, we encourage you to take your nausea medication as directed.    If you develop nausea and vomiting that is not controlled by your nausea medication, call the clinic.   BELOW ARE  SYMPTOMS THAT SHOULD BE REPORTED IMMEDIATELY:  *FEVER GREATER THAN 100.5 F  *CHILLS WITH OR WITHOUT FEVER  NAUSEA AND VOMITING THAT IS NOT CONTROLLED WITH YOUR NAUSEA MEDICATION  *UNUSUAL SHORTNESS OF BREATH  *UNUSUAL BRUISING OR BLEEDING  TENDERNESS IN MOUTH AND THROAT WITH OR WITHOUT PRESENCE OF ULCERS  *URINARY PROBLEMS  *BOWEL PROBLEMS  UNUSUAL RASH Items with * indicate a potential emergency and should be followed up as soon as possible.  Feel free to call the clinic should you have any questions or concerns. The clinic phone number is [phone].  Please show the CHEMO ALERT CARD at check-in to the Emergency Department and triage nurse.   

## 2018-11-06 NOTE — Telephone Encounter (Signed)
Scheduled appt per 7/17 los. °

## 2018-11-06 NOTE — Progress Notes (Signed)
Ok to give Delton See today per MD Irene Limbo

## 2018-11-10 ENCOUNTER — Telehealth: Payer: Self-pay | Admitting: *Deleted

## 2018-11-10 NOTE — Telephone Encounter (Signed)
Patient called with question for Dr.Kale. She has appt with Dr. Nelva Bush at [facility] to get an injection in her hip. Wants to know if that affects her receiving Opdivo next week? Per [name] - ok to have injection. One injection should not impact effectiveness of Opdivo.Patient verbalized understanding.

## 2018-11-12 DIAGNOSIS — M51369 Other intervertebral disc degeneration, lumbar region without mention of lumbar back pain or lower extremity pain: Secondary | ICD-10-CM | POA: Insufficient documentation

## 2018-11-12 DIAGNOSIS — M5136 Other intervertebral disc degeneration, lumbar region: Secondary | ICD-10-CM | POA: Insufficient documentation

## 2018-11-19 ENCOUNTER — Other Ambulatory Visit: Payer: Self-pay | Admitting: Gastroenterology

## 2018-11-20 ENCOUNTER — Other Ambulatory Visit: Payer: Self-pay

## 2018-11-20 ENCOUNTER — Inpatient Hospital Stay: Payer: Medicare Other

## 2018-11-20 ENCOUNTER — Ambulatory Visit: Payer: Medicare Other | Admitting: Hematology

## 2018-11-20 ENCOUNTER — Other Ambulatory Visit: Payer: Self-pay | Admitting: Hematology

## 2018-11-20 VITALS — BP 143/75 | HR 66 | Temp 98.3°F | Resp 18

## 2018-11-20 DIAGNOSIS — Z95828 Presence of other vascular implants and grafts: Secondary | ICD-10-CM

## 2018-11-20 DIAGNOSIS — C7951 Secondary malignant neoplasm of bone: Secondary | ICD-10-CM

## 2018-11-20 DIAGNOSIS — Z5112 Encounter for antineoplastic immunotherapy: Secondary | ICD-10-CM | POA: Diagnosis not present

## 2018-11-20 DIAGNOSIS — Z7189 Other specified counseling: Secondary | ICD-10-CM

## 2018-11-20 DIAGNOSIS — C642 Malignant neoplasm of left kidney, except renal pelvis: Secondary | ICD-10-CM

## 2018-11-20 LAB — CMP (CANCER CENTER ONLY)
ALT: 42 U/L (ref 0–44)
AST: 33 U/L (ref 15–41)
Albumin: 3 g/dL — ABNORMAL LOW (ref 3.5–5.0)
Alkaline Phosphatase: 123 U/L (ref 38–126)
Anion gap: 11 (ref 5–15)
BUN: 14 mg/dL (ref 8–23)
CO2: 26 mmol/L (ref 22–32)
Calcium: 8.2 mg/dL — ABNORMAL LOW (ref 8.9–10.3)
Chloride: 104 mmol/L (ref 98–111)
Creatinine: 1.37 mg/dL — ABNORMAL HIGH (ref 0.44–1.00)
GFR, Est AFR Am: 43 mL/min — ABNORMAL LOW (ref >60)
GFR, Estimated: 37 mL/min — ABNORMAL LOW (ref >60)
Glucose, Bld: 178 mg/dL — ABNORMAL HIGH (ref 70–99)
Potassium: 3.5 mmol/L (ref 3.5–5.1)
Sodium: 141 mmol/L (ref 135–145)
Total Bilirubin: 0.7 mg/dL (ref 0.3–1.2)
Total Protein: 6 g/dL — ABNORMAL LOW (ref 6.5–8.1)

## 2018-11-20 LAB — CBC WITH DIFFERENTIAL/PLATELET
Abs Immature Granulocytes: 0.19 10*3/uL — ABNORMAL HIGH (ref 0.00–0.07)
Basophils Absolute: 0.1 10*3/uL (ref 0.0–0.1)
Basophils Relative: 1 %
Eosinophils Absolute: 0.3 10*3/uL (ref 0.0–0.5)
Eosinophils Relative: 3 %
HCT: 33.5 % — ABNORMAL LOW (ref 36.0–46.0)
Hemoglobin: 10.6 g/dL — ABNORMAL LOW (ref 12.0–15.0)
Immature Granulocytes: 2 %
Lymphocytes Relative: 11 %
Lymphs Abs: 1.1 10*3/uL (ref 0.7–4.0)
MCH: 28.6 pg (ref 26.0–34.0)
MCHC: 31.6 g/dL (ref 30.0–36.0)
MCV: 90.5 fL (ref 80.0–100.0)
Monocytes Absolute: 1.2 10*3/uL — ABNORMAL HIGH (ref 0.1–1.0)
Monocytes Relative: 12 %
Neutro Abs: 7 10*3/uL (ref 1.7–7.7)
Neutrophils Relative %: 71 %
Platelets: 253 10*3/uL (ref 150–400)
RBC: 3.7 MIL/uL — ABNORMAL LOW (ref 3.87–5.11)
RDW: 14.1 % (ref 11.5–15.5)
WBC: 9.8 10*3/uL (ref 4.0–10.5)
nRBC: 0 % (ref 0.0–0.2)

## 2018-11-20 MED ORDER — SODIUM CHLORIDE 0.9 % IV SOLN
240.0000 mg | Freq: Once | INTRAVENOUS | Status: AC
  Administered 2018-11-20: 240 mg via INTRAVENOUS
  Filled 2018-11-20: qty 24

## 2018-11-20 MED ORDER — HEPARIN SOD (PORK) LOCK FLUSH 100 UNIT/ML IV SOLN
500.0000 [IU] | Freq: Once | INTRAVENOUS | Status: AC | PRN
  Administered 2018-11-20: 500 [IU]
  Filled 2018-11-20: qty 5

## 2018-11-20 MED ORDER — SODIUM CHLORIDE 0.9 % IV SOLN
Freq: Once | INTRAVENOUS | Status: AC
  Administered 2018-11-20: 11:00:00 via INTRAVENOUS
  Filled 2018-11-20: qty 250

## 2018-11-20 MED ORDER — SODIUM CHLORIDE 0.9% FLUSH
10.0000 mL | INTRAVENOUS | Status: DC | PRN
  Administered 2018-11-20: 10 mL
  Filled 2018-11-20: qty 10

## 2018-11-20 MED ORDER — SODIUM CHLORIDE 0.9% FLUSH
10.0000 mL | Freq: Once | INTRAVENOUS | Status: AC
  Administered 2018-11-20: 10 mL
  Filled 2018-11-20: qty 10

## 2018-11-20 NOTE — Addendum Note (Signed)
Addended by: Caryl Comes E on: 11/20/2018 12:47 PM   Modules accepted: Orders

## 2018-11-20 NOTE — Addendum Note (Signed)
Addended by: Dierdre Harness E on: 11/20/2018 11:09 AM   Modules accepted: Orders

## 2018-11-20 NOTE — Patient Instructions (Signed)
Coronavirus (COVID-19) Are you at risk?  Are you at risk for the Coronavirus (COVID-19)?  To be considered HIGH RISK for Coronavirus (COVID-19), you have to meet the following criteria:  . Traveled to [place], [place], [name], [place] or [place]; or in the [place] to [place], [place], [place], or [place]; and have fever, cough, and shortness of breath within the last 2 weeks of travel OR . Been in close contact with a person diagnosed with COVID-19 within the last 2 weeks and have fever, cough, and shortness of breath . IF YOU DO NOT MEET THESE CRITERIA, YOU ARE CONSIDERED LOW RISK FOR COVID-19.  What to do if you are HIGH RISK for COVID-19?  . If you are having a medical emergency, call 911. . Seek medical care right away. Before you go to a doctor's office, urgent care or emergency department, call ahead and tell them about your recent travel, contact with someone diagnosed with COVID-19, and your symptoms. You should receive instructions from your physician's office regarding next steps of care.  . When you arrive at healthcare provider, tell the healthcare staff immediately you have returned from visiting [place], [place], [place], [place] or [name]; or traveled in the [place] to [place], [place], [place], or [place]; in the last two weeks or you have been in close contact with a person diagnosed with COVID-19 in the last 2 weeks.   . Tell the health care staff about your symptoms: fever, cough and shortness of breath. . After you have been seen by a medical provider, you will be either: o Tested for (COVID-19) and discharged home on quarantine except to seek medical care if symptoms worsen, and asked to  - Stay home and avoid contact with others until you get your results (4-5 days)  - Avoid travel on public transportation if possible (such as bus, train, or airplane) or o Sent to the Emergency Department by EMS for evaluation, COVID-19 testing, and possible  admission depending on your condition and test results.  What to do if you are LOW RISK for COVID-19?  Reduce your risk of any infection by using the same precautions used for avoiding the common cold or flu:  . Wash your hands often with soap and warm water for at least 20 seconds.  If soap and water are not readily available, use an alcohol-based hand sanitizer with at least 60% alcohol.  . If coughing or sneezing, cover your mouth and nose by coughing or sneezing into the elbow areas of your shirt or coat, into a tissue or into your sleeve (not your hands). . Avoid shaking hands with others and consider head nods or verbal greetings only. . Avoid touching your eyes, nose, or mouth with unwashed hands.  . Avoid close contact with people who are sick. . Avoid places or events with large numbers of people in one location, like concerts or sporting events. . Carefully consider travel plans you have or are making. . If you are planning any travel outside or inside the [place], visit the CDC's Travelers' Health webpage for the latest health notices. . If you have some symptoms but not all symptoms, continue to monitor at home and seek medical attention if your symptoms worsen. . If you are having a medical emergency, call 911.   ADDITIONAL HEALTHCARE OPTIONS FOR PATIENTS   Telehealth / e-Visit: [email or web address]         [facility]: [phone]  [facility]: [phone]                   [facility]: [phone]   [facility] Discharge Instructions for Patients Receiving Chemotherapy  Today you received the following chemotherapy agents Opdivo   To help prevent nausea and vomiting after your treatment, we encourage you to take your nausea medication as directed.    If you develop nausea and vomiting that is not controlled by your nausea medication, call the clinic.   BELOW ARE  SYMPTOMS THAT SHOULD BE REPORTED IMMEDIATELY:  *FEVER GREATER THAN 100.5 F  *CHILLS WITH OR WITHOUT FEVER  NAUSEA AND VOMITING THAT IS NOT CONTROLLED WITH YOUR NAUSEA MEDICATION  *UNUSUAL SHORTNESS OF BREATH  *UNUSUAL BRUISING OR BLEEDING  TENDERNESS IN MOUTH AND THROAT WITH OR WITHOUT PRESENCE OF ULCERS  *URINARY PROBLEMS  *BOWEL PROBLEMS  UNUSUAL RASH Items with * indicate a potential emergency and should be followed up as soon as possible.  Feel free to call the clinic should you have any questions or concerns. The clinic phone number is [phone].  Please show the CHEMO ALERT CARD at check-in to the Emergency Department and triage nurse.   

## 2018-11-26 ENCOUNTER — Telehealth: Payer: Self-pay | Admitting: Hematology

## 2018-11-26 ENCOUNTER — Encounter (HOSPITAL_COMMUNITY): Payer: Self-pay | Admitting: *Deleted

## 2018-11-26 ENCOUNTER — Other Ambulatory Visit: Payer: Self-pay

## 2018-11-26 ENCOUNTER — Other Ambulatory Visit (HOSPITAL_COMMUNITY)
Admission: RE | Admit: 2018-11-26 | Discharge: 2018-11-26 | Disposition: A | Discharge status: Home / Self Care | Payer: Medicare Other | Source: Ambulatory Visit | Attending: Gastroenterology | Admitting: Gastroenterology

## 2018-11-26 DIAGNOSIS — Z20828 Contact with and (suspected) exposure to other viral communicable diseases: Secondary | ICD-10-CM | POA: Diagnosis not present

## 2018-11-26 DIAGNOSIS — Z01812 Encounter for preprocedural laboratory examination: Secondary | ICD-10-CM | POA: Insufficient documentation

## 2018-11-26 LAB — SARS CORONAVIRUS 2 (TAT 6-24 HRS): SARS Coronavirus 2: NEGATIVE

## 2018-11-26 NOTE — Progress Notes (Signed)
Pt denies SOB and chest pain. Pt stated that she is under the care of Dr. Percival Spanish, Cardiology. Pt made aware to stop taking  vitamins, fish oil and herbal medications. Do not take any NSAIDs ie: Ibuprofen, Advil, Naproxen (Aleve), Motrin, BC and Goody Powder.  Pt made aware to hold Prandin on DOS. Pt made aware to check BG every 2 hours prior to arrival to hospital on DOS. Pt made aware to treat a BG < 70 with 4 ounces of apple or cranberry juice, wait 15 minutes after intervention to recheck BG, if BG remains < 70, call Endoscopy  unit to speak with a nurse.  Pt denies that she and family memebers tested positive for COVID-19. Pt test results pending; pt reminded to quarantine.  Coronavirus Screening  Pt denies that she and family memebers experienced the following symptoms:  Cough yes/no: No Fever (>100.16F)  yes/no: No Runny nose yes/no: No Sore throat yes/no: No Difficulty breathing/shortness of breath  yes/no: No  Have you or a family member traveled in the last 14 days and where? yes/no: No   Pt reminded that hospital visitation restrictions are in effect and the importance of the restrictions.   Pt verbalized understanding of all pre-op instructions.

## 2018-11-26 NOTE — Telephone Encounter (Signed)
Called patient regarding rescheduled appointments on 08/14 and 08/28, patient is notified.

## 2018-11-30 ENCOUNTER — Encounter (HOSPITAL_COMMUNITY)
Admission: RE | Disposition: A | Discharge status: Home / Self Care | Payer: Self-pay | Source: Home / Self Care | Attending: Gastroenterology

## 2018-11-30 ENCOUNTER — Ambulatory Visit (HOSPITAL_COMMUNITY): Payer: Medicare Other

## 2018-11-30 ENCOUNTER — Ambulatory Visit (HOSPITAL_COMMUNITY): Payer: Medicare Other | Admitting: Anesthesiology

## 2018-11-30 ENCOUNTER — Ambulatory Visit (HOSPITAL_COMMUNITY)
Admission: RE | Admit: 2018-11-30 | Discharge: 2018-11-30 | Disposition: A | Discharge status: Home / Self Care | Payer: Medicare Other | Attending: Gastroenterology | Admitting: Gastroenterology

## 2018-11-30 ENCOUNTER — Other Ambulatory Visit: Payer: Self-pay

## 2018-11-30 ENCOUNTER — Encounter (HOSPITAL_COMMUNITY): Payer: Self-pay | Admitting: Anesthesiology

## 2018-11-30 DIAGNOSIS — Z85528 Personal history of other malignant neoplasm of kidney: Secondary | ICD-10-CM | POA: Insufficient documentation

## 2018-11-30 DIAGNOSIS — Z9221 Personal history of antineoplastic chemotherapy: Secondary | ICD-10-CM | POA: Insufficient documentation

## 2018-11-30 DIAGNOSIS — Y838 Other surgical procedures as the cause of abnormal reaction of the patient, or of later complication, without mention of misadventure at the time of the procedure: Secondary | ICD-10-CM | POA: Insufficient documentation

## 2018-11-30 DIAGNOSIS — E1136 Type 2 diabetes mellitus with diabetic cataract: Secondary | ICD-10-CM | POA: Insufficient documentation

## 2018-11-30 DIAGNOSIS — Z87891 Personal history of nicotine dependence: Secondary | ICD-10-CM | POA: Insufficient documentation

## 2018-11-30 DIAGNOSIS — I252 Old myocardial infarction: Secondary | ICD-10-CM | POA: Insufficient documentation

## 2018-11-30 DIAGNOSIS — T85590A Other mechanical complication of bile duct prosthesis, initial encounter: Secondary | ICD-10-CM | POA: Insufficient documentation

## 2018-11-30 DIAGNOSIS — K5900 Constipation, unspecified: Secondary | ICD-10-CM

## 2018-11-30 DIAGNOSIS — I2510 Atherosclerotic heart disease of native coronary artery without angina pectoris: Secondary | ICD-10-CM | POA: Diagnosis not present

## 2018-11-30 DIAGNOSIS — Z4659 Encounter for fitting and adjustment of other gastrointestinal appliance and device: Secondary | ICD-10-CM | POA: Diagnosis present

## 2018-11-30 DIAGNOSIS — R7989 Other specified abnormal findings of blood chemistry: Secondary | ICD-10-CM

## 2018-11-30 DIAGNOSIS — K831 Obstruction of bile duct: Secondary | ICD-10-CM | POA: Diagnosis not present

## 2018-11-30 DIAGNOSIS — Z905 Acquired absence of kidney: Secondary | ICD-10-CM | POA: Diagnosis not present

## 2018-11-30 DIAGNOSIS — R945 Abnormal results of liver function studies: Secondary | ICD-10-CM

## 2018-11-30 DIAGNOSIS — I10 Essential (primary) hypertension: Secondary | ICD-10-CM | POA: Insufficient documentation

## 2018-11-30 DIAGNOSIS — D649 Anemia, unspecified: Secondary | ICD-10-CM

## 2018-11-30 DIAGNOSIS — R109 Unspecified abdominal pain: Secondary | ICD-10-CM

## 2018-11-30 HISTORY — PX: ENDOSCOPIC RETROGRADE CHOLANGIOPANCREATOGRAPHY (ERCP) WITH PROPOFOL: SHX5810

## 2018-11-30 HISTORY — DX: Obstruction of bile duct: K83.1

## 2018-11-30 HISTORY — PX: BILIARY STENT PLACEMENT: SHX5538

## 2018-11-30 HISTORY — PX: BILIARY BRUSHING: SHX6843

## 2018-11-30 HISTORY — DX: Anemia, unspecified: D64.9

## 2018-11-30 HISTORY — PX: STENT REMOVAL: SHX6421

## 2018-11-30 LAB — GLUCOSE, CAPILLARY: Glucose-Capillary: 136 mg/dL — ABNORMAL HIGH (ref 70–99)

## 2018-11-30 SURGERY — ENDOSCOPIC RETROGRADE CHOLANGIOPANCREATOGRAPHY (ERCP) WITH PROPOFOL
Anesthesia: General

## 2018-11-30 MED ORDER — SODIUM CHLORIDE 0.9 % IV SOLN
INTRAVENOUS | Status: DC | PRN
  Administered 2018-11-30: 25 ug/min via INTRAVENOUS

## 2018-11-30 MED ORDER — ROCURONIUM BROMIDE 10 MG/ML (PF) SYRINGE
PREFILLED_SYRINGE | INTRAVENOUS | Status: DC | PRN
  Administered 2018-11-30: 40 mg via INTRAVENOUS

## 2018-11-30 MED ORDER — SODIUM CHLORIDE 0.9 % IV SOLN
1.5000 g | Freq: Once | INTRAVENOUS | Status: AC
  Administered 2018-11-30: 1.5 g via INTRAVENOUS
  Filled 2018-11-30: qty 1.5

## 2018-11-30 MED ORDER — FENTANYL CITRATE (PF) 100 MCG/2ML IJ SOLN
INTRAMUSCULAR | Status: DC | PRN
  Administered 2018-11-30: 100 ug via INTRAVENOUS

## 2018-11-30 MED ORDER — INDOMETHACIN 50 MG RE SUPP
RECTAL | Status: DC | PRN
  Administered 2018-11-30: 100 mg via RECTAL

## 2018-11-30 MED ORDER — FENTANYL CITRATE (PF) 100 MCG/2ML IJ SOLN
INTRAMUSCULAR | Status: AC
  Filled 2018-11-30: qty 2

## 2018-11-30 MED ORDER — LIDOCAINE 2% (20 MG/ML) 5 ML SYRINGE
INTRAMUSCULAR | Status: DC | PRN
  Administered 2018-11-30: 60 mg via INTRAVENOUS

## 2018-11-30 MED ORDER — LACTATED RINGERS IV SOLN
INTRAVENOUS | Status: DC
  Administered 2018-11-30: 09:00:00 via INTRAVENOUS

## 2018-11-30 MED ORDER — ONDANSETRON HCL 4 MG/2ML IJ SOLN
INTRAMUSCULAR | Status: DC | PRN
  Administered 2018-11-30: 4 mg via INTRAVENOUS

## 2018-11-30 MED ORDER — GLUCAGON HCL RDNA (DIAGNOSTIC) 1 MG IJ SOLR
INTRAMUSCULAR | Status: AC
  Filled 2018-11-30: qty 1

## 2018-11-30 MED ORDER — DEXAMETHASONE SODIUM PHOSPHATE 10 MG/ML IJ SOLN
INTRAMUSCULAR | Status: DC | PRN
  Administered 2018-11-30: 4 mg via INTRAVENOUS

## 2018-11-30 MED ORDER — SODIUM CHLORIDE 0.9 % IV SOLN: INTRAVENOUS | Status: DC

## 2018-11-30 MED ORDER — IOPAMIDOL (ISOVUE-300) INJECTION 61%
INTRAVENOUS | Status: DC | PRN
  Administered 2018-11-30: 10 mL via INTRAVENOUS

## 2018-11-30 MED ORDER — INDOMETHACIN 50 MG RE SUPP
RECTAL | Status: AC
  Filled 2018-11-30: qty 2

## 2018-11-30 MED ORDER — AMOXICILLIN-POT CLAVULANATE 875-125 MG PO TABS: 1.0000 | ORAL_TABLET | Freq: Two times a day (BID) | ORAL | 0 refills | Status: AC

## 2018-11-30 MED ORDER — PROPOFOL 10 MG/ML IV BOLUS
INTRAVENOUS | Status: DC | PRN
  Administered 2018-11-30: 100 mg via INTRAVENOUS

## 2018-11-30 MED ORDER — SUGAMMADEX SODIUM 200 MG/2ML IV SOLN
INTRAVENOUS | Status: DC | PRN
  Administered 2018-11-30: 200 mg via INTRAVENOUS

## 2018-11-30 MED ORDER — GLUCAGON HCL RDNA (DIAGNOSTIC) 1 MG IJ SOLR
INTRAMUSCULAR | Status: DC | PRN
  Administered 2018-11-30: 0.25 mg via INTRAVENOUS

## 2018-11-30 NOTE — Transfer of Care (Signed)
Immediate Anesthesia Transfer of Care Note  Patient: Audrey [name]  Procedure(s) Performed: ENDOSCOPIC RETROGRADE CHOLANGIOPANCREATOGRAPHY (ERCP) WITH PROPOFOL (N/A )  Patient Location: PACU and Endoscopy Unit  Anesthesia Type:General  Level of Consciousness: awake, drowsy and patient cooperative  Airway & Oxygen Therapy: Patient Spontanous Breathing and Patient connected to nasal cannula oxygen  Post-op Assessment: Report given to RN and Post -op Vital signs reviewed and stable  Post vital signs: Reviewed and stable  Last Vitals:  Vitals Value Taken Time  BP    Temp    Pulse    Resp    SpO2      Last Pain:  Vitals:   11/30/18 0908  TempSrc: Oral  PainSc: 0-No pain         Complications: No apparent anesthesia complications

## 2018-11-30 NOTE — Anesthesia Procedure Notes (Signed)
Procedure Name: Intubation Date/Time: 11/30/2018 10:04 AM Performed by: Renato Shin, CRNA Pre-anesthesia Checklist: Patient identified, Emergency Drugs available, Suction available and Patient being monitored Patient Re-evaluated:Patient Re-evaluated prior to induction Oxygen Delivery Method: Circle system utilized Preoxygenation: Pre-oxygenation with 100% oxygen Induction Type: IV induction Ventilation: Mask ventilation without difficulty Laryngoscope Size: Miller and 2 Grade View: Grade II Tube type: Oral Tube size: 7.0 mm Number of attempts: 1 Airway Equipment and Method: Stylet and Oral airway Placement Confirmation: ETT inserted through vocal cords under direct vision,  positive ETCO2 and breath sounds checked- equal and bilateral Secured at: 21 cm Tube secured with: Tape Dental Injury: Teeth and Oropharynx as per pre-operative assessment

## 2018-11-30 NOTE — Op Note (Addendum)
[facility] Patient Name: Audrey [name] Procedure Date : 11/30/2018 MRN: [id number] Attending MD: Justice Britain , MD Date of Birth: June 08, 1940 CSN: [id number] Age: 78 Admit Type: Outpatient Procedure:                ERCP Indications:              Malignant stricture of the common bile duct,                            History of Choledocholithiasis and s/p CCK with                            malignant appearing stricture previously requiring                            multiple plastic stents Providers:                Justice Britain, MD, Carlyn Reichert, RN, Elspeth Cho Tech., Technician, Luane School, CRNA Referring MD:             Julien Nordmann MD, MD Medicines:                General Anesthesia, Indomethacin 100 mg PR, Unasyn                            1.5 g IV Complications:            No immediate complications. Estimated Blood Loss:     Estimated blood loss was minimal. Procedure:                Pre-Anesthesia Assessment:                           - Prior to the procedure, a History and Physical                            was performed, and patient medications and                            allergies were reviewed. The patient's tolerance of                            previous anesthesia was also reviewed. The risks                            and benefits of the procedure and the sedation                            options and risks were discussed with the patient.                            All questions were answered, and informed consent  was obtained. Prior Anticoagulants: The patient has                            taken no previous anticoagulant or antiplatelet                            agents except for aspirin. ASA Grade Assessment: II                            - A patient with mild systemic disease. After                            reviewing the risks and benefits, the patient  was                            deemed in satisfactory condition to undergo the                            procedure.                           After obtaining informed consent, the scope was                            passed under direct vision. Throughout the                            procedure, the patient's blood pressure, pulse, and                            oxygen saturations were monitored continuously. The                            TJF-Q180V ([id number] Olympus duodenoscope was                            introduced through the mouth, and used to inject                            contrast into and used to inject contrast into the                            bile duct. The ERCP was accomplished without                            difficulty. The patient tolerated the procedure. Scope In: Scope Out: Findings:      A scout film of the abdomen was obtained. Surgical clips, consistent       with previous cholecystectomy, were seen in the area of the right upper       quadrant of the abdomen. Two stents ending in the main bile duct were       seen. Unfortunately, we did not save this initial scout film to CANOPY.      The esophagus was successfully intubated under direct vision  without       detailed examination of the pharynx, larynx, and associated structures,       and upper GI tract. A biliary sphincterotomy had been performed. The       sphincterotomy appeared open. Two plastic biliary stents originating in       the biliary tree were emerging from the major papilla. The stents were       partially occluded. Two stents were removed from the biliary tree using       a Raptor grasping device and sent for cytology.      A short 0.035 inch Soft Jagwire was passed into the biliary tree. The       Autotome sphincterotome was passed over the guidewire and the bile duct       was then deeply cannulated. Contrast was injected. I personally       interpreted the bile duct images. Ductal flow  of contrast was adequate.       Image quality was adequate. Contrast extended to the hepatic ducts.       Opacification of the entire biliary tree except for the cystic duct and       gallbladder was successful. The maximum diameter of the ducts was 9 mm.       The lower third of the main duct contained filling defects thought to be       sludge. To discover objects, the biliary tree was swept with a retrieval       balloon starting at the bifurcation. Sludge was swept from the duct. An       occlusion cholangiogram was performed that showed the lower third of the       main duct contained a single mild narrowing 15 mm in length - this was       improved from previous as a result of previous stenting. Cells for       cytology were obtained by brushing in the lower third of the main bile       duct in the region of narrowing. One [place] 10 mm by 4 cm fully covered       metal biliary stent was placed into the common bile duct. Bile flowed       through the stent. The stent was in good position.      A pancreatogram was not performed.      The duodenoscope was withdrawn from the patient. Impression:               - Prior biliary sphincterotomy appeared open.                           - Two partially occluded stents from the biliary                            tree were seen in the major papilla. These were                            removed and sent for cytology.                           - The fluoroscopic examination was suspicious for  sludge.                           - A single mild biliary narrowing was found in the                            lower third of the main bile duct. The stricture                            was indeterminate - through previous sampling at                            one point suggested malignant cells (negative EUS                            for pancreatic mass and negative CTs).                           - The biliary tree was swept  and sludge was found.                           - Cells for cytology obtained in the lower third of                            the main duct narrowing.                           - One covered metal biliary stent was placed into                            the common bile duct to traverse the                            narrowing/stenosis. Recommendation:           - The patient will be observed post-procedure,                            until all discharge criteria are met.                           - Discharge patient to home.                           - Resume previous diet.                           - Observe patient's clinical course.                           - Check liver enzymes (AST, ALT, alkaline                            phosphatase, bilirubin) at appointment to be  scheduled.                           - Watch for pancreatitis, bleeding, perforation,                            and cholangitis.                           - Repeat ERCP in 8-12 months to clean out/exchange                            stent - or sooner if concern on imaging or LFT                            abnormalities.                           - Monitor in next 72 hours for pain due to stent                            placement since the biliary tree is slightly                            smaller than the stent itself, but comparatively to                            prior imaging the intraductal dilation has improved                            on today's cholangiograms.                           - Augmentin BID x 5-days in setting of patient                            undergoing chemotherapy later this week to decrease                            risk of post-infectious ERCP complications.                           - The findings and recommendations were discussed                            with the patient.                           - The findings and recommendations were discussed                             with the patient's family. Procedure Code(s):        --- Professional ---                           [phone],  Endoscopic retrograde                            cholangiopancreatography (ERCP); with removal and                            exchange of stent(s), biliary or pancreatic duct,                            including pre- and post-dilation and guide wire                            passage, when performed, including sphincterotomy,                            when performed, each stent exchanged                           [id number], Endoscopic retrograde                            cholangiopancreatography (ERCP); with removal of                            calculi/debris from biliary/pancreatic duct(s) Diagnosis Code(s):        --- Professional ---                           U27.253G, Other mechanical complication of bile                            duct prosthesis, initial encounter                           K83.1, Obstruction of bile duct                           Z46.59, Encounter for fitting and adjustment of                            other gastrointestinal appliance and device CPT copyright 2019 [employer]. All rights reserved. The codes documented in this report are preliminary and upon coder review may  be revised to meet current compliance requirements. Justice Britain, MD 11/30/2018 11:12:33 AM Number of Addenda: 0

## 2018-11-30 NOTE — H&P (Signed)
GASTROENTEROLOGY OUTPATIENT PROCEDURE H&P NOTE   Primary Care Physician: [name], MD  HPI: Audrey [name] is a 78 y.o. female who presents for ERCP.  Past Medical History:  Diagnosis Date  . Anemia   . Arthritis    lower back, hips, hands  . Biliary stricture   . Diabetes mellitus ([place])    type 2 - no meds, diet controlled  . Early cataracts, bilateral   . Elevated liver enzymes   . GERD (gastroesophageal reflux disease)    occasional - diet controlled  . History of hiatal hernia   . HTN (hypertension)   . Hyperlipidemia   . Hypothyroidism   . left renal ca dx'd 2018   renal cancer - left kidney removed, pill chemo x 1 yr  . Myocardial infarction ([place]) 1991   no deficits  . SVD (spontaneous vaginal delivery)    x 3  . Wears glasses    Past Surgical History:  Procedure Laterality Date  . BALLOON DILATION N/A 07/23/2018   Procedure: BALLOON DILATION;  Surgeon: Rush Landmark Telford Nab., MD;  Location: [place];  Service: Gastroenterology;  Laterality: N/A;  . BILIARY BRUSHING  08/10/2018   Procedure: BILIARY BRUSHING;  Surgeon: Rush Landmark Telford Nab., MD;  Location: [place];  Service: Gastroenterology;;  . BILIARY DILATION  08/10/2018   Procedure: BILIARY DILATION;  Surgeon: Irving Copas., MD;  Location: [place];  Service: Gastroenterology;;  . BILIARY DILATION  08/27/2018   Procedure: BILIARY DILATION;  Surgeon: Irving Copas., MD;  Location: [place];  Service: Gastroenterology;;  . BILIARY STENT PLACEMENT  08/10/2018   Procedure: BILIARY STENT PLACEMENT;  Surgeon: Irving Copas., MD;  Location: [place];  Service: Gastroenterology;;  . BILIARY STENT PLACEMENT  08/27/2018   Procedure: BILIARY STENT PLACEMENT;  Surgeon: Irving Copas., MD;  Location: [place];  Service: Gastroenterology;;  . BIOPSY  07/23/2018   Procedure: BIOPSY;  Surgeon: Irving Copas., MD;  Location: [place];  Service:  Gastroenterology;;  . CHOLECYSTECTOMY N/A 09/02/2018   Procedure: LAPAROSCOPIC CHOLECYSTECTOMY;  Surgeon: Stark Klein, MD;  Location: [place];  Service: General;  Laterality: N/A;  . COLONOSCOPY     normal   . ENDOSCOPIC RETROGRADE CHOLANGIOPANCREATOGRAPHY (ERCP) WITH PROPOFOL N/A 08/10/2018   Procedure: ENDOSCOPIC RETROGRADE CHOLANGIOPANCREATOGRAPHY (ERCP) WITH PROPOFOL;  Surgeon: Irving Copas., MD;  Location: [place];  Service: Gastroenterology;  Laterality: N/A;  . ERCP N/A 07/23/2018   Procedure: ENDOSCOPIC RETROGRADE CHOLANGIOPANCREATOGRAPHY (ERCP);  Surgeon: Irving Copas., MD;  Location: [place];  Service: Gastroenterology;  Laterality: N/A;  . ERCP N/A 08/27/2018   Procedure: ENDOSCOPIC RETROGRADE CHOLANGIOPANCREATOGRAPHY (ERCP);  Surgeon: Irving Copas., MD;  Location: [place];  Service: Gastroenterology;  Laterality: N/A;  . ESOPHAGOGASTRODUODENOSCOPY (EGD) WITH PROPOFOL N/A 08/27/2018   Procedure: ESOPHAGOGASTRODUODENOSCOPY (EGD) WITH PROPOFOL;  Surgeon: Rush Landmark Telford Nab., MD;  Location: [place];  Service: Gastroenterology;  Laterality: N/A;  . EUS N/A 08/27/2018   Procedure: ESOPHAGEAL ENDOSCOPIC ULTRASOUND (EUS) RADIAL;  Surgeon: Rush Landmark Telford Nab., MD;  Location: [place];  Service: Gastroenterology;  Laterality: N/A;  . FINE NEEDLE ASPIRATION  08/27/2018   Procedure: FINE NEEDLE ASPIRATION (FNA) LINEAR;  Surgeon: Irving Copas., MD;  Location: [place];  Service: Gastroenterology;;  . IR IMAGING GUIDED PORT INSERTION  01/08/2018  . LAPAROSCOPIC NEPHRECTOMY Left 08/01/2016   Procedure: LAPAROSCOPIC  RADICAL NEPHRECTOMY/ REPAIR OF UMBILICAL HERNIA;  Surgeon: Raynelle Bring, MD;  Location: [facility];  Service: Urology;  Laterality: Left;  . REMOVAL  OF STONES  07/23/2018   Procedure: REMOVAL OF GALL STONES;  Surgeon: Rush Landmark Telford Nab., MD;  Location: [place];  Service: Gastroenterology;;  . REMOVAL OF STONES   08/10/2018   Procedure: REMOVAL OF STONES;  Surgeon: Irving Copas., MD;  Location: [place];  Service: Gastroenterology;;  . REMOVAL OF STONES  08/27/2018   Procedure: REMOVAL OF STONES;  Surgeon: Irving Copas., MD;  Location: [place];  Service: Gastroenterology;;  . [name]  07/23/2018   Procedure: [name];  Surgeon: Irving Copas., MD;  Location: [place];  Service: Gastroenterology;;  . Lavell Islam REMOVAL  08/27/2018   Procedure: STENT REMOVAL;  Surgeon: Irving Copas., MD;  Location: [place];  Service: Gastroenterology;;  . UPPER GI ENDOSCOPY     x 1   Current Facility-Administered Medications  Medication Dose Route Frequency Provider Last Rate Last Dose  . 0.9 %  sodium chloride infusion   Intravenous Continuous Mansouraty, Telford Nab., MD      . ampicillin-sulbactam (UNASYN) 1.5 g in sodium chloride 0.9 % 100 mL IVPB  1.5 g Intravenous Once Mansouraty, Telford Nab., MD      . lactated ringers infusion   Intravenous Continuous Mansouraty, Telford Nab., MD       Facility-Administered Medications Ordered in Other Encounters  Medication Dose Route Frequency Provider Last Rate Last Dose  . sodium chloride flush (NS) 0.9 % injection 10 mL  10 mL Intracatheter PRN Truitt Merle, MD   10 mL at 11/20/18 1232   Allergies  Allergen Reactions  . Adhesive [Tape] Other (See Comments)    Tears skin off Paper tape is ok per patient  . Gabapentin Other (See Comments)    "Makes me loopy"  . Nsaids Other (See Comments)    Liver damage  . Statins Other (See Comments)    Liver/kidney issues.   Family History  Problem Relation Age of Onset  . Heart attack Father 16  . Heart disease Brother 63       CABG  . Colon cancer Neg Hx   . Esophageal cancer Neg Hx   . Inflammatory bowel disease Neg Hx   . Liver disease Neg Hx   . Pancreatic cancer Neg Hx   . Rectal cancer Neg Hx   . Stomach cancer Neg Hx    Social History   Socioeconomic  History  . Marital status: Married    Spouse name: Not on file  . Number of children: 3  . Years of education: Not on file  . Highest education level: Not on file  Occupational History  . Occupation: retired  Scientific laboratory technician  . Financial resource strain: Not on file  . Food insecurity    Worry: Not on file    Inability: Not on file  . Transportation needs    Medical: Not on file    Non-medical: Not on file  Tobacco Use  . Smoking status: Former Smoker    Packs/day: 1.00    Years: 8.00    Pack years: 8.00    Types: Cigarettes    Quit date: 06/18/1964    Years since quitting: 54.4  . Smokeless tobacco: Never Used  Substance and Sexual Activity  . Alcohol use: No  . Drug use: No  . Sexual activity: Not on file  Lifestyle  . Physical activity    Days per week: Not on file    Minutes per session: Not on file  . Stress: Not on file  Relationships  . Social connections  Talks on phone: Not on file    Gets together: Not on file    Attends religious service: Not on file    Active member of club or organization: Not on file    Attends meetings of clubs or organizations: Not on file    Relationship status: Not on file  . Intimate partner violence    Fear of current or ex partner: Not on file    Emotionally abused: Not on file    Physically abused: Not on file    Forced sexual activity: Not on file  Other Topics Concern  . Not on file  Social History Narrative   Married.  Three children    Physical Exam: Vital signs in last 24 hours: Temp:  [98.3 F (36.8 C)] 98.3 F (36.8 C) (08/10 0908) Pulse Rate:  [57] 57 (08/10 0908) Resp:  [16] 16 (08/10 0908) BP: (164)/(61) 164/61 (08/10 0908) SpO2:  [98 %] 98 % (08/10 0908) Weight:  [58.5 kg] 58.5 kg (08/10 0908)   GEN: NAD EYE: Sclerae anicteric ENT: MMM CV: Nontachycardic GI: Soft, NT/ND NEURO:  Alert & Oriented x 3  Lab Results: No results for input(s): WBC, HGB, HCT, PLT in the last 72 hours. BMET No results for  input(s): NA, K, CL, CO2, GLUCOSE, BUN, CREATININE, CALCIUM in the last 72 hours. LFT No results for input(s): PROT, ALBUMIN, AST, ALT, ALKPHOS, BILITOT, BILIDIR, IBILI in the last 72 hours. PT/INR No results for input(s): LABPROT, INR in the last 72 hours.   Impression / Plan: This is a 78 y.o.female who presents for ERCP with stent exchange.  The risks and benefits of endoscopic evaluation were discussed with the patient; these include but are not limited to the risk of perforation, infection, bleeding, missed lesions, lack of diagnosis, severe illness requiring hospitalization, as well as anesthesia and sedation related illnesses.  The patient is agreeable to proceed.    Justice Britain, MD [facility] Office # [phone]

## 2018-11-30 NOTE — Discharge Instructions (Signed)
YOU HAD AN ENDOSCOPIC PROCEDURE TODAY: Refer to the procedure report and other information in the discharge instructions given to you for any specific questions about what was found during the examination. If this information does not answer your questions, please call [facility] office at [phone] to clarify.   YOU SHOULD EXPECT: Some feelings of bloating in the abdomen. Passage of more gas than usual. Walking can help get rid of the air that was put into your GI tract during the procedure and reduce the bloating. If you had a lower endoscopy (such as a colonoscopy or flexible sigmoidoscopy) you may notice spotting of blood in your stool or on the toilet paper. Some abdominal soreness may be present for a day or two, also.  DIET: Your first meal following the procedure should be a light meal and then it is ok to progress to your normal diet. A half-sandwich or bowl of soup is an example of a good first meal. Heavy or fried foods are harder to digest and may make you feel nauseous or bloated. Drink plenty of fluids but you should avoid alcoholic beverages for 24 hours. If you had a esophageal dilation, please see attached instructions for diet.    ACTIVITY: Your care partner should take you home directly after the procedure. You should plan to take it easy, moving slowly for the rest of the day. You can resume normal activity the day after the procedure however YOU SHOULD NOT DRIVE, use power tools, machinery or perform tasks that involve climbing or major physical exertion for 24 hours (because of the sedation medicines used during the test).   SYMPTOMS TO REPORT IMMEDIATELY: A gastroenterologist can be reached at any hour. Please call [phone]  for any of the following symptoms:   Following upper endoscopy (EGD, EUS, ERCP, esophageal dilation) Vomiting of blood or coffee ground material  New, significant abdominal pain  New, significant chest pain or pain under the shoulder blades  Painful or  persistently difficult swallowing  New shortness of breath  Black, tarry-looking or red, bloody stools  FOLLOW UP:  If any biopsies were taken you will be contacted by phone or by letter within the next 1-3 weeks. Call [phone]  if you have not heard about the biopsies in 3 weeks.  Please also call with any specific questions about appointments or follow up tests.  

## 2018-11-30 NOTE — Anesthesia Preprocedure Evaluation (Addendum)
Anesthesia Evaluation  Patient identified by MRN, date of birth, ID band Patient awake    Reviewed: Allergy & Precautions, NPO status , Patient's Chart, lab work & pertinent test results  History of Anesthesia Complications Negative for: history of anesthetic complications  Airway Mallampati: III  TM Distance: >3 FB Neck ROM: Full    Dental  (+) Teeth Intact   Pulmonary neg pulmonary ROS, former smoker,    Pulmonary exam normal        Cardiovascular hypertension, + CAD and + Past MI  Normal cardiovascular exam     Neuro/Psych negative neurological ROS  negative psych ROS   GI/Hepatic Neg liver ROS, hiatal hernia, GERD  ,  Endo/Other  diabetes, Type 2Hypothyroidism   Renal/GU Renal InsufficiencyRenal disease (solitary kidney)  negative genitourinary   Musculoskeletal  (+) Arthritis ,   Abdominal   Peds  Hematology negative hematology ROS (+)   Anesthesia Other Findings   Reproductive/Obstetrics                            Anesthesia Physical Anesthesia Plan  ASA: III  Anesthesia Plan: General   Post-op Pain Management:    Induction: Intravenous and Rapid sequence  PONV Risk Score and Plan: 3 and Ondansetron, Dexamethasone and Treatment may vary due to age or medical condition  Airway Management Planned: Oral ETT  Additional Equipment: None  Intra-op Plan:   Post-operative Plan: Extubation in OR  Informed Consent: I have reviewed the patients History and Physical, chart, labs and discussed the procedure including the risks, benefits and alternatives for the proposed anesthesia with the patient or authorized representative who has indicated his/her understanding and acceptance.     Dental advisory given  Plan Discussed with:   Anesthesia Plan Comments:        Anesthesia Quick Evaluation

## 2018-12-01 ENCOUNTER — Encounter (HOSPITAL_COMMUNITY): Payer: Self-pay | Admitting: Gastroenterology

## 2018-12-01 ENCOUNTER — Other Ambulatory Visit: Payer: Self-pay | Admitting: Gastroenterology

## 2018-12-01 ENCOUNTER — Other Ambulatory Visit: Payer: Self-pay | Admitting: Hematology

## 2018-12-01 NOTE — Anesthesia Postprocedure Evaluation (Signed)
Anesthesia Post Note  Patient: Audrey [name]  Procedure(s) Performed: ENDOSCOPIC RETROGRADE CHOLANGIOPANCREATOGRAPHY (ERCP) WITH PROPOFOL (N/A ) STENT REMOVAL BILIARY STENT PLACEMENT BILIARY BRUSHING     Patient location during evaluation: PACU Anesthesia Type: General Level of consciousness: awake and alert Pain management: pain level controlled Vital Signs Assessment: post-procedure vital signs reviewed and stable Respiratory status: spontaneous breathing, nonlabored ventilation and respiratory function stable Cardiovascular status: blood pressure returned to baseline and stable Postop Assessment: no apparent nausea or vomiting Anesthetic complications: no    Last Vitals:  Vitals:   11/30/18 1112 11/30/18 1119  BP: (!) 155/60 (!) 148/79  Pulse: 67 65  Resp: 11 16  Temp:    SpO2: 99% 95%    Last Pain:  Vitals:   11/30/18 1119  TempSrc:   PainSc: 0-No pain   Pain Goal:                   Lidia Collum

## 2018-12-03 ENCOUNTER — Other Ambulatory Visit: Payer: Self-pay | Admitting: *Deleted

## 2018-12-03 ENCOUNTER — Telehealth: Payer: Self-pay | Admitting: *Deleted

## 2018-12-03 ENCOUNTER — Encounter: Payer: Self-pay | Admitting: Gastroenterology

## 2018-12-03 DIAGNOSIS — E039 Hypothyroidism, unspecified: Secondary | ICD-10-CM

## 2018-12-03 DIAGNOSIS — C642 Malignant neoplasm of left kidney, except renal pelvis: Secondary | ICD-10-CM

## 2018-12-03 NOTE — Telephone Encounter (Signed)
Patient called. Saw endocrinologist and he asked if patient could have A1c and thyroid panel done when she had labs done here on Friday. [name] authorized the tests and they were ordered.

## 2018-12-04 ENCOUNTER — Ambulatory Visit: Payer: Medicare Other | Admitting: Hematology

## 2018-12-04 ENCOUNTER — Other Ambulatory Visit: Payer: Self-pay

## 2018-12-04 ENCOUNTER — Inpatient Hospital Stay: Payer: Medicare Other | Attending: Hematology

## 2018-12-04 ENCOUNTER — Other Ambulatory Visit: Payer: Self-pay | Admitting: Hematology

## 2018-12-04 ENCOUNTER — Inpatient Hospital Stay: Payer: Medicare Other

## 2018-12-04 ENCOUNTER — Other Ambulatory Visit: Payer: Medicare Other

## 2018-12-04 VITALS — BP 153/80 | HR 70 | Temp 98.3°F | Resp 16 | Wt 124.8 lb

## 2018-12-04 DIAGNOSIS — E538 Deficiency of other specified B group vitamins: Secondary | ICD-10-CM | POA: Insufficient documentation

## 2018-12-04 DIAGNOSIS — C642 Malignant neoplasm of left kidney, except renal pelvis: Secondary | ICD-10-CM

## 2018-12-04 DIAGNOSIS — E039 Hypothyroidism, unspecified: Secondary | ICD-10-CM | POA: Insufficient documentation

## 2018-12-04 DIAGNOSIS — Z5112 Encounter for antineoplastic immunotherapy: Secondary | ICD-10-CM | POA: Diagnosis not present

## 2018-12-04 DIAGNOSIS — Z95828 Presence of other vascular implants and grafts: Secondary | ICD-10-CM

## 2018-12-04 DIAGNOSIS — Z7189 Other specified counseling: Secondary | ICD-10-CM

## 2018-12-04 DIAGNOSIS — C7951 Secondary malignant neoplasm of bone: Secondary | ICD-10-CM | POA: Diagnosis not present

## 2018-12-04 DIAGNOSIS — C649 Malignant neoplasm of unspecified kidney, except renal pelvis: Secondary | ICD-10-CM | POA: Diagnosis not present

## 2018-12-04 LAB — CBC WITH DIFFERENTIAL/PLATELET
Abs Immature Granulocytes: 0.18 10*3/uL — ABNORMAL HIGH (ref 0.00–0.07)
Basophils Absolute: 0.1 10*3/uL (ref 0.0–0.1)
Basophils Relative: 1 %
Eosinophils Absolute: 0.3 10*3/uL (ref 0.0–0.5)
Eosinophils Relative: 4 %
HCT: 36.9 % (ref 36.0–46.0)
Hemoglobin: 11.7 g/dL — ABNORMAL LOW (ref 12.0–15.0)
Immature Granulocytes: 2 %
Lymphocytes Relative: 16 %
Lymphs Abs: 1.6 10*3/uL (ref 0.7–4.0)
MCH: 28.5 pg (ref 26.0–34.0)
MCHC: 31.7 g/dL (ref 30.0–36.0)
MCV: 89.8 fL (ref 80.0–100.0)
Monocytes Absolute: 1 10*3/uL (ref 0.1–1.0)
Monocytes Relative: 10 %
Neutro Abs: 6.4 10*3/uL (ref 1.7–7.7)
Neutrophils Relative %: 67 %
Platelets: 292 10*3/uL (ref 150–400)
RBC: 4.11 MIL/uL (ref 3.87–5.11)
RDW: 14.5 % (ref 11.5–15.5)
WBC: 9.5 10*3/uL (ref 4.0–10.5)
nRBC: 0 % (ref 0.0–0.2)

## 2018-12-04 LAB — CMP (CANCER CENTER ONLY)
ALT: 67 U/L — ABNORMAL HIGH (ref 0–44)
AST: 49 U/L — ABNORMAL HIGH (ref 15–41)
Albumin: 3.6 g/dL (ref 3.5–5.0)
Alkaline Phosphatase: 147 U/L — ABNORMAL HIGH (ref 38–126)
Anion gap: 10 (ref 5–15)
BUN: 22 mg/dL (ref 8–23)
CO2: 24 mmol/L (ref 22–32)
Calcium: 9.3 mg/dL (ref 8.9–10.3)
Chloride: 102 mmol/L (ref 98–111)
Creatinine: 1.55 mg/dL — ABNORMAL HIGH (ref 0.44–1.00)
GFR, Est AFR Am: 37 mL/min — ABNORMAL LOW (ref >60)
GFR, Estimated: 32 mL/min — ABNORMAL LOW (ref >60)
Glucose, Bld: 149 mg/dL — ABNORMAL HIGH (ref 70–99)
Potassium: 4.8 mmol/L (ref 3.5–5.1)
Sodium: 136 mmol/L (ref 135–145)
Total Bilirubin: 0.6 mg/dL (ref 0.3–1.2)
Total Protein: 6.8 g/dL (ref 6.5–8.1)

## 2018-12-04 LAB — HEMOGLOBIN A1C
Hgb A1c MFr Bld: 7.3 % — ABNORMAL HIGH (ref 4.8–5.6)
Mean Plasma Glucose: 162.81 mg/dL

## 2018-12-04 MED ORDER — SODIUM CHLORIDE 0.9 % IV SOLN
240.0000 mg | Freq: Once | INTRAVENOUS | Status: AC
  Administered 2018-12-04: 240 mg via INTRAVENOUS
  Filled 2018-12-04: qty 24

## 2018-12-04 MED ORDER — HEPARIN SOD (PORK) LOCK FLUSH 100 UNIT/ML IV SOLN
500.0000 [IU] | Freq: Once | INTRAVENOUS | Status: AC | PRN
  Administered 2018-12-04: 500 [IU]
  Filled 2018-12-04: qty 5

## 2018-12-04 MED ORDER — SODIUM CHLORIDE 0.9 % IV SOLN
Freq: Once | INTRAVENOUS | Status: AC
  Administered 2018-12-04: 11:00:00 via INTRAVENOUS
  Filled 2018-12-04: qty 250

## 2018-12-04 MED ORDER — SODIUM CHLORIDE 0.9% FLUSH
10.0000 mL | Freq: Once | INTRAVENOUS | Status: AC
  Administered 2018-12-04: 10 mL
  Filled 2018-12-04: qty 10

## 2018-12-04 MED ORDER — SODIUM CHLORIDE 0.9% FLUSH
10.0000 mL | INTRAVENOUS | Status: DC | PRN
  Administered 2018-12-04: 10 mL
  Filled 2018-12-04: qty 10

## 2018-12-04 NOTE — Progress Notes (Signed)
OK to treat w/ creatinine 1.55 per V.O. from Dr. Alen Blew (on call MD)

## 2018-12-04 NOTE — Patient Instructions (Signed)
[facility] Discharge Instructions for Patients Receiving Chemotherapy  Today you received the following chemotherapy agents: Nivolumab (Opdivo)  To help prevent nausea and vomiting after your treatment, we encourage you to take your nausea medication as directed.   If you develop nausea and vomiting that is not controlled by your nausea medication, call the clinic.   BELOW ARE SYMPTOMS THAT SHOULD BE REPORTED IMMEDIATELY:  *FEVER GREATER THAN 100.5 F  *CHILLS WITH OR WITHOUT FEVER  NAUSEA AND VOMITING THAT IS NOT CONTROLLED WITH YOUR NAUSEA MEDICATION  *UNUSUAL SHORTNESS OF BREATH  *UNUSUAL BRUISING OR BLEEDING  TENDERNESS IN MOUTH AND THROAT WITH OR WITHOUT PRESENCE OF ULCERS  *URINARY PROBLEMS  *BOWEL PROBLEMS  UNUSUAL RASH Items with * indicate a potential emergency and should be followed up as soon as possible.  Feel free to call the clinic should you have any questions or concerns. The clinic phone number is [phone].  Please show the CHEMO ALERT CARD at check-in to the Emergency Department and triage nurse.  Coronavirus (COVID-19) Are you at risk?  Are you at risk for the Coronavirus (COVID-19)?  To be considered HIGH RISK for Coronavirus (COVID-19), you have to meet the following criteria:  . Traveled to [place], [place], [name], [place] or [place]; or in the [place] to [place], [place], [place], or [place]; and have fever, cough, and shortness of breath within the last 2 weeks of travel OR . Been in close contact with a person diagnosed with COVID-19 within the last 2 weeks and have fever, cough, and shortness of breath . IF YOU DO NOT MEET THESE CRITERIA, YOU ARE CONSIDERED LOW RISK FOR COVID-19.  What to do if you are HIGH RISK for COVID-19?  . If you are having a medical emergency, call 911. . Seek medical care right away. Before you go to a doctor's office, urgent care or emergency department, call ahead and tell them about  your recent travel, contact with someone diagnosed with COVID-19, and your symptoms. You should receive instructions from your physician's office regarding next steps of care.  . When you arrive at healthcare provider, tell the healthcare staff immediately you have returned from visiting [place], [place], [place], [place] or [name]; or traveled in the [place] to [place], [place], [place], or [place]; in the last two weeks or you have been in close contact with a person diagnosed with COVID-19 in the last 2 weeks.   . Tell the health care staff about your symptoms: fever, cough and shortness of breath. . After you have been seen by a medical provider, you will be either: o Tested for (COVID-19) and discharged home on quarantine except to seek medical care if symptoms worsen, and asked to  - Stay home and avoid contact with others until you get your results (4-5 days)  - Avoid travel on public transportation if possible (such as bus, train, or airplane) or o Sent to the Emergency Department by EMS for evaluation, COVID-19 testing, and possible admission depending on your condition and test results.  What to do if you are LOW RISK for COVID-19?  Reduce your risk of any infection by using the same precautions used for avoiding the common cold or flu:  . Wash your hands often with soap and warm water for at least 20 seconds.  If soap and water are not readily available, use an alcohol-based hand sanitizer with at least 60% alcohol.  . If coughing or   sneezing, cover your mouth and nose by coughing or sneezing into the elbow areas of your shirt or coat, into a tissue or into your sleeve (not your hands). . Avoid shaking hands with others and consider head nods or verbal greetings only. . Avoid touching your eyes, nose, or mouth with unwashed hands.  . Avoid close contact with people who are sick. . Avoid places or events with large numbers of people in one location, like concerts or sporting  events. . Carefully consider travel plans you have or are making. . If you are planning any travel outside or inside the [place], visit the CDC's Travelers' Health webpage for the latest health notices. . If you have some symptoms but not all symptoms, continue to monitor at home and seek medical attention if your symptoms worsen. . If you are having a medical emergency, call 911.   ADDITIONAL HEALTHCARE OPTIONS FOR PATIENTS  [facility] / e-Visit: [email or web address]         [facility]: [phone]  [facility]: [phone]                   [facility]: [phone]   

## 2018-12-05 LAB — THYROID PANEL WITH TSH
Free Thyroxine Index: 3.4 (ref 1.2–4.9)
T3 Uptake Ratio: 32 % (ref 24–39)
T4, Total: 10.5 ug/dL (ref 4.5–12.0)
TSH: 2.27 u[IU]/mL (ref 0.450–4.500)

## 2018-12-07 ENCOUNTER — Telehealth: Payer: Self-pay | Admitting: *Deleted

## 2018-12-07 NOTE — Telephone Encounter (Signed)
Faxed results of labs drawn 12/04/2018 to Dr. Katina Degree office at patient's request. Fax: [phone] per website. Fax confirmation received.

## 2018-12-17 NOTE — Progress Notes (Signed)
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 The patient's last visit with [name] was on 11/06/2018. The pt reports that she is doing well overall.  The pt reports that she recently went to [facility] for her back pain  Of note since the patient's last visit, pt has had an ERCP completed on 11/30/2018 with results revealing Endoscopic CBD cannulation and intervention, biliary stent placement without apparent complication.  Lab results today (12/18/18) of CBC w/diff and CMP is as follows: all values are WNL except for RBC at 3.69, Hgb at 10.7, HCT at 33.2, CO2 at 20, Glucose at 147, Creatinine at 1.35, Calcium at 8.6, Alkaline Phosphatase at 149, GFR est non af am at 38, and GFR est AFR am at 44.  On review of systems, pt reports night sweats and denies SOB, abdominal pain, fevers, chillsand any other symptoms.    MEDICAL HISTORY:    Hypertension Dyslipidemia Osteoarthritis Ex-smoker Coronary artery disease Thyroid disorder-was apparently on levothyroxine 25 g daily which has subsequently been discontinued . 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 Earley Brooke Colonoscopy 03/2016 Dr. Earley Brooke  SOCIAL HISTORY: Social History   Socioeconomic History   Marital status: Married    Spouse name: Not on file   Number of children: 3   Years of education: Not on file   Highest  education level: Not on file  Occupational History   Occupation: retired  Scientist, product/process development strain: Not on file   Food insecurity    Worry: Not on file    Inability: Not on Lexicographer needs    Medical: Not on file    Non-medical: Not on file  Tobacco Use   Smoking status: Former Smoker    Packs/day: 1.00    Years: 8.00    Pack years: 8.00    Types: Cigarettes    Quit date: 06/18/1964    Years since quitting: 54.5   Smokeless tobacco: Never Used  Substance and Sexual Activity   Alcohol use: No   Drug use: No   Sexual activity: Not on file  Lifestyle   Physical activity    Days per week: Not on file    Minutes per session: Not on file   Stress: Not on file  Relationships   Social connections    Talks on phone: Not on file    Gets together: Not on file    Attends religious service: Not on file    Active member of club or organization: Not on file    Attends meetings of clubs or organizations: Not on file    Relationship status: Not on file   Intimate partner violence    Fear of current or ex partner: Not on file    Emotionally abused: Not on file    Physically abused: Not on file    Forced sexual activity: Not on file  Other Topics Concern  Not on file  Social History Narrative   Married.  Three children   Patient lives in [place] Works as a Solicitor part-time Ex-smoker quit long time ago In 1961 . Smoked 2 packs per day for 8 years prior to that .  or a lot of stress since her daughter is also getting treated for stage IV uterine cancer.  FAMILY HISTORY:  Mother deceased Father with asthma and emphysema died at 17 years with the MI. Brother deceased of heart disease   ALLERGIES:  is allergic to adhesive [tape]; gabapentin; nsaids; and statins.  MEDICATIONS:  Current Outpatient Medications  Medication Sig Dispense Refill   amLODipine (NORVASC) 5 MG tablet Take 5 mg by mouth daily.   6    atorvastatin (LIPITOR) 40 MG tablet Take 40 mg by mouth at bedtime.      carvedilol (COREG) 6.25 MG tablet Take 6.25 mg by mouth 2 (two) times daily.  6   clobetasol ointment (TEMOVATE) [id number] % Apply 1 application topically 2 (two) times daily as needed (lichen sclerosis).      Denosumab (XGEVA [place]) Inject into the skin every 6 (six) weeks.     hydrocortisone (CORTEF) 10 MG tablet Take 5-10 mg by mouth See admin instructions. Take 1 tablet (10 mg) by mouth in the morning & 1/2 tablet (5 mg) by mouth in the evening.     levothyroxine (SYNTHROID) 75 MCG tablet Take 75 mcg by mouth daily before breakfast.      lidocaine (LIDODERM) 5 % Place 1 patch onto the skin daily. Remove & Discard patch within 12 hours or as directed by MD Using on Right Hip     lidocaine-prilocaine (EMLA) cream Apply to affected area once (Patient taking differently: Apply 1 application topically daily as needed (prior to port access). ) 30 g 3   Nivolumab (OPDIVO IV) Inject 1 Dose into the vein every 14 (fourteen) days. [place]     omeprazole (PRILOSEC) 20 MG capsule Take 1 capsule by mouth twice daily 60 capsule 0   repaglinide (PRANDIN) 0.5 MG tablet Take 0.5 mg by mouth 2 (two) times daily before a meal.     No current facility-administered medications for this visit.    Facility-Administered Medications Ordered in Other Visits  Medication Dose Route Frequency Provider Last Rate Last Dose   sodium chloride flush (NS) 0.9 % injection 10 mL  10 mL Intracatheter PRN Truitt Merle, MD   10 mL at 11/20/18 1232    REVIEW OF SYSTEMS:   A 10+ POINT REVIEW OF SYSTEMS WAS OBTAINED including neurology, dermatology, psychiatry, cardiac, respiratory, lymph, extremities, GI, GU, Musculoskeletal, constitutional, breasts, reproductive, HEENT.  All pertinent positives are noted in the HPI.  All others are negative.     PHYSICAL EXAMINATION:  ECOG PERFORMANCE STATUS: 1 - Symptomatic but completely ambulatory  Vitals:    12/18/18 1019  BP: 138/62  Pulse: 66  Resp: 18  Temp: 98.5 F (36.9 C)  SpO2: 99%   Filed Weights   12/18/18 1019  Weight: 125 lb 9.6 oz (57 kg)   .Body mass index is 25.37 kg/m.    GENERAL:alert, in no acute distress and comfortable SKIN: no acute rashes, no significant lesions EYES: conjunctiva are pink and non-injected, sclera anicteric OROPHARYNX: MMM, no exudates, no oropharyngeal erythema or ulceration NECK: supple, no JVD LYMPH:  no palpable lymphadenopathy in the cervical, axillary or inguinal regions LUNGS: clear to auscultation b/l with normal respiratory effort HEART: regular rate & rhythm  ABDOMEN:  normoactive bowel sounds , non tender, not distended. Extremity: no pedal edema PSYCH: alert & oriented x 3 with fluent speech NEURO: no focal motor/sensory deficits    LABORATORY DATA:  I have reviewed the data as listed  . CBC Latest Ref Rng & Units 12/18/2018 12/04/2018 11/20/2018  WBC 4.0 - 10.5 K/uL 8.3 9.5 9.8  Hemoglobin 12.0 - 15.0 g/dL 10.7(L) 11.7(L) 10.6(L)  Hematocrit 36.0 - 46.0 % 33.2(L) 36.9 33.5(L)  Platelets 150 - 400 K/uL 252 292 253    CBC    Component Value Date/Time   WBC 8.3 12/18/2018 0921   RBC 3.69 (L) 12/18/2018 0921   HGB 10.7 (L) 12/18/2018 0921   HGB 11.4 (L) 09/11/2018 0900   HGB 11.2 (L) 04/04/2017 0830   HCT 33.2 (L) 12/18/2018 0921   HCT 35.2 04/04/2017 0830   PLT 252 12/18/2018 0921   PLT 277 09/11/2018 0900   PLT 250 04/04/2017 0830   MCV 90.0 12/18/2018 0921   MCV 107.6 (H) 04/04/2017 0830   MCH 29.0 12/18/2018 0921   MCHC 32.2 12/18/2018 0921   RDW 14.6 12/18/2018 0921   RDW 14.0 04/04/2017 0830   LYMPHSABS 1.3 12/18/2018 0921   LYMPHSABS 1.6 04/04/2017 0830   MONOABS 0.9 12/18/2018 0921   MONOABS 0.4 04/04/2017 0830   EOSABS 0.1 12/18/2018 0921   EOSABS 0.1 04/04/2017 0830   BASOSABS 0.1 12/18/2018 0921   BASOSABS 0.0 04/04/2017 0830     . CMP Latest Ref Rng & Units 12/18/2018 12/04/2018 11/20/2018  Glucose  70 - 99 mg/dL 147(H) 149(H) 178(H)  BUN 8 - 23 mg/dL 22 22 14   Creatinine 0.44 - 1.00 mg/dL 1.35(H) 1.55(H) 1.37(H)  Sodium 135 - 145 mmol/L 136 136 141  Potassium 3.5 - 5.1 mmol/L 5.1 4.8 3.5  Chloride 98 - 111 mmol/L 106 102 104  CO2 22 - 32 mmol/L 20(L) 24 26  Calcium 8.9 - 10.3 mg/dL 8.6(L) 9.3 8.2(L)  Total Protein 6.5 - 8.1 g/dL 6.7 6.8 6.0(L)  Total Bilirubin 0.3 - 1.2 mg/dL 0.5 0.6 0.7  Alkaline Phos 38 - 126 U/L 149(H) 147(H) 123  AST 15 - 41 U/L 28 49(H) 33  ALT 0 - 44 U/L 39 67(H) 42   B12 level 299 (OSH)-->455  . Lab Results  Component Value Date   LDH 136 03/13/2018        08/27/18 Surgical Pathology from Cholecystectomy:         RADIOGRAPHIC STUDIES: I have personally reviewed the radiological images as listed and agreed with the findings in the report. Nm Pet Image Restag (ps) Skull Base To Thigh  Result Date: 01/03/2017 IMPRESSION: 1. There is a new lesion in the hepatic dome which is FDG avid and low in attenuation on CT imaging consistent with metastatic disease. Another region of uptake in the left hepatic lobe posteriorly demonstrates no CT correlate. A second subtle metastasis is not excluded on today's study. An MRI could better assess for other hepatic metastases if clinically warranted. 2. New metastatic lesion in the left side of T8. 3. New pulmonary nodule in the right upper lobe. This is too small to characterize but suspicious. Recommend attention on follow-up. No FDG avid nodules or other enlarging nodules. 4. The uptake at the previous left renal artery has almost resolved in the interval and is favored to be post therapeutic. Recommend attention on follow-up. 5. The metastasis in the posterior right hilum seen previously has resolved. Electronically Signed   By: Shanon Brow  Jimmye Norman III M.D   On: 01/03/2017 11:16   .Dg Ercp Biliary & Pancreatic Ducts  Result Date: 11/30/2018 CLINICAL DATA:  Biliary stricture EXAM: ERCP TECHNIQUE: Multiple spot images  obtained with the fluoroscopic device and submitted for interpretation post-procedure. COMPARISON:  08/27/2018 FINDINGS: Series of fluoroscopic spot images document endoscopic cannulation and opacification of the CBD with passage of a balloon tipped catheter. The intrahepatic bile ducts are incompletely opacified, appearing decompressed centrally. The CBD is mildly ectatic. Recent regularly narrowing in the distal CBD. Final images document placement of metallic biliary stent across the distal CBD with decompression. No extravasation. Cholecystectomy clips. IMPRESSION: Endoscopic CBD cannulation and intervention, biliary stent placement without apparent complication. These images were submitted for radiologic interpretation only. Please see the procedural report for the amount of contrast and the fluoroscopy time utilized. Electronically Signed   By: Lucrezia Europe M.D.   On: 11/30/2018 10:58    ASSESSMENT & PLAN:    78 y.o. 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 PET/CT 06/19/2017 -- consistent with partial metabolic response to treatment.  Rt adrenal gland bx - showed clear cell RCC  S/p CYtoreductive left radical nephrectomy and left adrenal gland resection on 08/01/2016 by Dr Alinda Money.  10/16/17 PET/CT revealed Continued improvement, with the T7 metastatic lesion no longer significantly hypermetabolic. The previous right lower lobe pulmonary nodule is even less apparent, perhaps about 2 mm in diameter today; given that this measured 6 mm on 03/28/2017 this probably represents an effectively treated metastatic lesion. Currently no appreciable hypermetabolic activity is identified to suggest active malignancy. Distended gallbladder with gallbladder wall thickening and gallstones. Correlate clinically in assessing for cholecystitis. Small but abnormal amount of free pelvic  fluid, nonspecific. Other imaging findings of potential clinical significance: Chronic ethmoid sinusitis. Aortic Atherosclerosis. Stable 5 mm right middle lobe pulmonary nodule, not hypermetabolic but below sensitive PET-CT size thresholds. Left nephrectomy. Notable pelvic floor laxity with cystocele. Chronic bilateral Sacroiliitis.  04/03/18 PET/CT revealed No evidence for new or progressive hypermetabolic disease on today's study to suggest metastatic progression. 2. Stable appearance of the T7 metastatic lesion without Hypermetabolism. 3. Tiny focus of FDG accumulation identified along the skin of the low right inguinal fold. No associated lesion evident on CT. This may be related to urinary contaminant. 4. Cholelithiasis with similar appearance of diffuse gallbladder wall thickening. 5. Stable 5 mm right middle lobe pulmonary nodule. 6.  Aortic Atherosclerois. 7. Diffuse colonic diverticulosis.   09/18/18 CT A/P revealed "Common duct stent in place. Improvement to resolution in biliary duct dilatation compared to 08/29/2018. Interval cholecystectomy without acute complication. 2. Left nephrectomy, without evidence of metastatic disease. 3.  Tiny hiatal hernia. 4. Coronary artery atherosclerosis. Aortic Atherosclerosis. 5. Mild limitations secondary to lack of IV contrast. 6. Marked pelvic floor laxity with cystocele and rectal prolapse".  #2 b/l adrenal metastases from [place] s/p left adrenalectomy with adrenal insuff - follows with Dr Buddy Duty.  #3 Small pulmonary lesions -- 10/16/17 PET/CT showed pulmonary less apparent than 03/28/17 PET/CT, decreasing from 12mm to 53mm diameter.   MRI brain shows no evidence of metastatic disease  #4 T7/8 Bone metastases - received Xgeva every 4 weeks from May 2018 to June 2019. 10/16/17 PET/CT revealed improvement with T7 metastatic lesion no longer significantly hypermetabolic.  -on Marchelle Folks  #5 ?liver mets- rpt PET/CT from 10/16/2017 shows no overt evidence of  metastatic disease in the  liver.  #6 Grade 1 Nausea - improved and intermittent. Hasnt used her anti-emetic as instructed so some nausea and decreased po intake.  #7 Grade 1 Diarrhea - resolved  #8 Hyponatremia -  resolved with sodium at 136 - likely related to some element of adrenal insufficiency, diarrhea, limited by mouth intake. Primarily solute free fluid intake.   #9 Acute on chronic renal insufficiency Creatine stable 1.35  #10 Hyperkalemia due to ACEI + RF- resolved  #11 Hemorrhoids -chronic with some bleeing --Recommended Sitz bath and OTC Anusol or Nupercaine for her hemorrhoid relief. 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 Buddy Duty  #14 HTN - ?control. Patient tends to be anxious and has higher blood pressures in the clinic. She can have increased blood pressure from [place] as well. Plan: -continued close f/u with her PCP /cardiology regarding the many elements necessary for her care that are not directly related to her oncology care. -ACEI held due to AKI and hyperkalemia - following with cardiology to mx this. Has been started on Amlodipine instead.  #15 Grade 1 mucositis- resolved -Advised the patient to continue use of Magic Mouthwash to aid with nutrition  #16 Newly diagnosed ?CAD - positive cardiac nuclear stress test Following with cardiology and nephrology ? Need for pre-operative cardiac cath  #17 Choledocholithiasis and cholelithiasis 07/10/18 US Abdomen revealed Biliary duct dilatation with 8 mm calculus  at the level of the ampulla in the distal common bile duct. 2. Cholelithiasis. No gallbladder wall thickening or pericholecystic fluid. 3. Appearance of the liver raises concern for underlying hepatic cirrhosis. No focal liver lesions are demonstrable. 4.  Left kidney absent. 5.  Small cysts in right kidney. 6.  Aortic Atherosclerosis.  S/p ERCP on 07/23/18 with Dr. Valarie Merino Mansouraty  09/02/18 Cytopathology from ERCP was suspicious for malignant cells in bile duct, but was not definitive, other two findings were benign.  PLAN:  -Discussed pt labwork today, 12/18/18; all values are WNL except for RBC at 3.69, Hgb at 10.7, HCT at 33.2, CO2 at 20, Glucose at 147, Creatinine at 1.35, Calcium at 8.6, Alkaline Phosphatase at 149, GFR est non af am at 38, and GFR est AFR am at 44. -Prescription refills for lidocaine patches-sent to her pharmacy -PET scan in one week -The pt has no prohibitive toxicities from continuing Xgeva or Nivolumab at this time.  -See back in 1 month with PET scan in 2 weeks   FOLLOW UP: PET/CT in 2 weeks - Please schedule next 4 doses of Nivolumab with labs - MD visit only every other treatment (could cancel the extra appointments). -continue Marchelle Folks   The total time spent in the appt was 25 minutes and more than 50% was on counseling and direct patient cares.  All of the patient's questions were answered with apparent satisfaction. The patient knows to call the clinic with any problems, questions or concerns.    Sullivan Lone MD [place] AAHIVMS [facility] Hematology/Oncology Physician [facility]  (Office):       [phone] (Work cell):  [phone] (Fax):  [phone]  I, Jacqualyn Posey, am acting as a Education administrator for Dr. Sullivan Lone.   .I have reviewed the above documentation for accuracy and completeness, and I agree with the above. Brunetta Genera MD

## 2018-12-18 ENCOUNTER — Other Ambulatory Visit: Payer: Medicare Other

## 2018-12-18 ENCOUNTER — Inpatient Hospital Stay: Payer: Medicare Other

## 2018-12-18 ENCOUNTER — Ambulatory Visit: Payer: Medicare Other | Admitting: Hematology

## 2018-12-18 ENCOUNTER — Other Ambulatory Visit: Payer: Self-pay

## 2018-12-18 ENCOUNTER — Inpatient Hospital Stay (HOSPITAL_BASED_OUTPATIENT_CLINIC_OR_DEPARTMENT_OTHER): Payer: Medicare Other | Admitting: Hematology

## 2018-12-18 ENCOUNTER — Telehealth: Payer: Self-pay | Admitting: Hematology

## 2018-12-18 VITALS — BP 138/62 | HR 66 | Temp 98.5°F | Resp 18 | Ht 59.0 in | Wt 125.6 lb

## 2018-12-18 DIAGNOSIS — Z5112 Encounter for antineoplastic immunotherapy: Secondary | ICD-10-CM

## 2018-12-18 DIAGNOSIS — I2510 Atherosclerotic heart disease of native coronary artery without angina pectoris: Secondary | ICD-10-CM | POA: Diagnosis not present

## 2018-12-18 DIAGNOSIS — C642 Malignant neoplasm of left kidney, except renal pelvis: Secondary | ICD-10-CM

## 2018-12-18 DIAGNOSIS — C7951 Secondary malignant neoplasm of bone: Secondary | ICD-10-CM

## 2018-12-18 DIAGNOSIS — Z95828 Presence of other vascular implants and grafts: Secondary | ICD-10-CM

## 2018-12-18 DIAGNOSIS — Z7189 Other specified counseling: Secondary | ICD-10-CM

## 2018-12-18 LAB — CBC WITH DIFFERENTIAL/PLATELET
Abs Immature Granulocytes: 0.05 10*3/uL (ref 0.00–0.07)
Basophils Absolute: 0.1 10*3/uL (ref 0.0–0.1)
Basophils Relative: 1 %
Eosinophils Absolute: 0.1 10*3/uL (ref 0.0–0.5)
Eosinophils Relative: 2 %
HCT: 33.2 % — ABNORMAL LOW (ref 36.0–46.0)
Hemoglobin: 10.7 g/dL — ABNORMAL LOW (ref 12.0–15.0)
Immature Granulocytes: 1 %
Lymphocytes Relative: 15 %
Lymphs Abs: 1.3 10*3/uL (ref 0.7–4.0)
MCH: 29 pg (ref 26.0–34.0)
MCHC: 32.2 g/dL (ref 30.0–36.0)
MCV: 90 fL (ref 80.0–100.0)
Monocytes Absolute: 0.9 10*3/uL (ref 0.1–1.0)
Monocytes Relative: 10 %
Neutro Abs: 5.9 10*3/uL (ref 1.7–7.7)
Neutrophils Relative %: 71 %
Platelets: 252 10*3/uL (ref 150–400)
RBC: 3.69 MIL/uL — ABNORMAL LOW (ref 3.87–5.11)
RDW: 14.6 % (ref 11.5–15.5)
WBC: 8.3 10*3/uL (ref 4.0–10.5)
nRBC: 0 % (ref 0.0–0.2)

## 2018-12-18 LAB — CMP (CANCER CENTER ONLY)
ALT: 39 U/L (ref 0–44)
AST: 28 U/L (ref 15–41)
Albumin: 3.4 g/dL — ABNORMAL LOW (ref 3.5–5.0)
Alkaline Phosphatase: 149 U/L — ABNORMAL HIGH (ref 38–126)
Anion gap: 10 (ref 5–15)
BUN: 22 mg/dL (ref 8–23)
CO2: 20 mmol/L — ABNORMAL LOW (ref 22–32)
Calcium: 8.6 mg/dL — ABNORMAL LOW (ref 8.9–10.3)
Chloride: 106 mmol/L (ref 98–111)
Creatinine: 1.35 mg/dL — ABNORMAL HIGH (ref 0.44–1.00)
GFR, Est AFR Am: 44 mL/min — ABNORMAL LOW (ref >60)
GFR, Estimated: 38 mL/min — ABNORMAL LOW (ref >60)
Glucose, Bld: 147 mg/dL — ABNORMAL HIGH (ref 70–99)
Potassium: 5.1 mmol/L (ref 3.5–5.1)
Sodium: 136 mmol/L (ref 135–145)
Total Bilirubin: 0.5 mg/dL (ref 0.3–1.2)
Total Protein: 6.7 g/dL (ref 6.5–8.1)

## 2018-12-18 MED ORDER — HEPARIN SOD (PORK) LOCK FLUSH 100 UNIT/ML IV SOLN
500.0000 [IU] | Freq: Once | INTRAVENOUS | Status: DC
  Filled 2018-12-18: qty 5

## 2018-12-18 MED ORDER — HEPARIN SOD (PORK) LOCK FLUSH 100 UNIT/ML IV SOLN
500.0000 [IU] | Freq: Once | INTRAVENOUS | Status: DC | PRN
  Filled 2018-12-18: qty 5

## 2018-12-18 MED ORDER — DENOSUMAB 120 MG/1.7ML ~~LOC~~ SOLN
120.0000 mg | Freq: Once | SUBCUTANEOUS | Status: AC
  Administered 2018-12-18: 120 mg via SUBCUTANEOUS

## 2018-12-18 MED ORDER — SODIUM CHLORIDE 0.9% FLUSH
10.0000 mL | Freq: Once | INTRAVENOUS | Status: AC
  Administered 2018-12-18: 10 mL
  Filled 2018-12-18: qty 10

## 2018-12-18 MED ORDER — SODIUM CHLORIDE 0.9 % IV SOLN
240.0000 mg | Freq: Once | INTRAVENOUS | Status: AC
  Administered 2018-12-18: 12:00:00 240 mg via INTRAVENOUS
  Filled 2018-12-18: qty 24

## 2018-12-18 MED ORDER — SODIUM CHLORIDE 0.9% FLUSH
10.0000 mL | Freq: Once | INTRAVENOUS | Status: DC
  Filled 2018-12-18: qty 10

## 2018-12-18 MED ORDER — SODIUM CHLORIDE 0.9% FLUSH
10.0000 mL | INTRAVENOUS | Status: DC | PRN
  Filled 2018-12-18: qty 10

## 2018-12-18 MED ORDER — DENOSUMAB 120 MG/1.7ML ~~LOC~~ SOLN
SUBCUTANEOUS | Status: AC
  Filled 2018-12-18: qty 1.7

## 2018-12-18 MED ORDER — SODIUM CHLORIDE 0.9 % IV SOLN
Freq: Once | INTRAVENOUS | Status: AC
  Administered 2018-12-18: 11:00:00 via INTRAVENOUS
  Filled 2018-12-18: qty 250

## 2018-12-18 NOTE — Telephone Encounter (Signed)
Scheduled appt per 8/28 los.  Patient will get a print out after her treatment from her infusion nurse.

## 2018-12-21 MED ORDER — LIDOCAINE 5 % EX PTCH: 1.0000 | MEDICATED_PATCH | CUTANEOUS | 0 refills | Status: DC

## 2018-12-24 ENCOUNTER — Telehealth: Payer: Self-pay | Admitting: Hematology

## 2018-12-24 NOTE — Telephone Encounter (Signed)
Called patient regarding infusion log, informed patient 09/11 appointment time has been changed. Patient is notified.

## 2019-01-01 ENCOUNTER — Other Ambulatory Visit: Payer: 59

## 2019-01-01 ENCOUNTER — Inpatient Hospital Stay: Payer: Medicare Other

## 2019-01-01 ENCOUNTER — Ambulatory Visit: Payer: 59

## 2019-01-01 ENCOUNTER — Other Ambulatory Visit: Payer: Self-pay

## 2019-01-01 ENCOUNTER — Inpatient Hospital Stay: Payer: Medicare Other | Attending: Hematology

## 2019-01-01 VITALS — BP 140/74 | HR 67 | Temp 98.7°F | Resp 18

## 2019-01-01 DIAGNOSIS — I252 Old myocardial infarction: Secondary | ICD-10-CM | POA: Insufficient documentation

## 2019-01-01 DIAGNOSIS — K449 Diaphragmatic hernia without obstruction or gangrene: Secondary | ICD-10-CM | POA: Insufficient documentation

## 2019-01-01 DIAGNOSIS — K219 Gastro-esophageal reflux disease without esophagitis: Secondary | ICD-10-CM | POA: Diagnosis not present

## 2019-01-01 DIAGNOSIS — C642 Malignant neoplasm of left kidney, except renal pelvis: Secondary | ICD-10-CM | POA: Insufficient documentation

## 2019-01-01 DIAGNOSIS — R948 Abnormal results of function studies of other organs and systems: Secondary | ICD-10-CM | POA: Diagnosis not present

## 2019-01-01 DIAGNOSIS — Z87891 Personal history of nicotine dependence: Secondary | ICD-10-CM | POA: Insufficient documentation

## 2019-01-01 DIAGNOSIS — D649 Anemia, unspecified: Secondary | ICD-10-CM | POA: Diagnosis not present

## 2019-01-01 DIAGNOSIS — I10 Essential (primary) hypertension: Secondary | ICD-10-CM | POA: Insufficient documentation

## 2019-01-01 DIAGNOSIS — Z95828 Presence of other vascular implants and grafts: Secondary | ICD-10-CM

## 2019-01-01 DIAGNOSIS — C7951 Secondary malignant neoplasm of bone: Secondary | ICD-10-CM | POA: Diagnosis not present

## 2019-01-01 DIAGNOSIS — E039 Hypothyroidism, unspecified: Secondary | ICD-10-CM | POA: Diagnosis not present

## 2019-01-01 DIAGNOSIS — E119 Type 2 diabetes mellitus without complications: Secondary | ICD-10-CM | POA: Insufficient documentation

## 2019-01-01 DIAGNOSIS — I340 Nonrheumatic mitral (valve) insufficiency: Secondary | ICD-10-CM | POA: Diagnosis not present

## 2019-01-01 DIAGNOSIS — M1990 Unspecified osteoarthritis, unspecified site: Secondary | ICD-10-CM | POA: Diagnosis not present

## 2019-01-01 DIAGNOSIS — E785 Hyperlipidemia, unspecified: Secondary | ICD-10-CM | POA: Insufficient documentation

## 2019-01-01 DIAGNOSIS — I2510 Atherosclerotic heart disease of native coronary artery without angina pectoris: Secondary | ICD-10-CM | POA: Insufficient documentation

## 2019-01-01 DIAGNOSIS — Z7189 Other specified counseling: Secondary | ICD-10-CM

## 2019-01-01 DIAGNOSIS — E538 Deficiency of other specified B group vitamins: Secondary | ICD-10-CM | POA: Diagnosis not present

## 2019-01-01 DIAGNOSIS — Z5112 Encounter for antineoplastic immunotherapy: Secondary | ICD-10-CM | POA: Insufficient documentation

## 2019-01-01 LAB — CBC WITH DIFFERENTIAL/PLATELET
Abs Immature Granulocytes: 0.14 10*3/uL — ABNORMAL HIGH (ref 0.00–0.07)
Basophils Absolute: 0.1 10*3/uL (ref 0.0–0.1)
Basophils Relative: 1 %
Eosinophils Absolute: 0.1 10*3/uL (ref 0.0–0.5)
Eosinophils Relative: 1 %
HCT: 35 % — ABNORMAL LOW (ref 36.0–46.0)
Hemoglobin: 11.1 g/dL — ABNORMAL LOW (ref 12.0–15.0)
Immature Granulocytes: 2 %
Lymphocytes Relative: 15 %
Lymphs Abs: 1.4 10*3/uL (ref 0.7–4.0)
MCH: 28.5 pg (ref 26.0–34.0)
MCHC: 31.7 g/dL (ref 30.0–36.0)
MCV: 90 fL (ref 80.0–100.0)
Monocytes Absolute: 1 10*3/uL (ref 0.1–1.0)
Monocytes Relative: 11 %
Neutro Abs: 6.6 10*3/uL (ref 1.7–7.7)
Neutrophils Relative %: 70 %
Platelets: 292 10*3/uL (ref 150–400)
RBC: 3.89 MIL/uL (ref 3.87–5.11)
RDW: 14.6 % (ref 11.5–15.5)
WBC: 9.2 10*3/uL (ref 4.0–10.5)
nRBC: 0 % (ref 0.0–0.2)

## 2019-01-01 LAB — CMP (CANCER CENTER ONLY)
ALT: 34 U/L (ref 0–44)
AST: 31 U/L (ref 15–41)
Albumin: 3.5 g/dL (ref 3.5–5.0)
Alkaline Phosphatase: 169 U/L — ABNORMAL HIGH (ref 38–126)
Anion gap: 8 (ref 5–15)
BUN: 24 mg/dL — ABNORMAL HIGH (ref 8–23)
CO2: 23 mmol/L (ref 22–32)
Calcium: 8.8 mg/dL — ABNORMAL LOW (ref 8.9–10.3)
Chloride: 101 mmol/L (ref 98–111)
Creatinine: 1.41 mg/dL — ABNORMAL HIGH (ref 0.44–1.00)
GFR, Est AFR Am: 42 mL/min — ABNORMAL LOW (ref >60)
GFR, Estimated: 36 mL/min — ABNORMAL LOW (ref >60)
Glucose, Bld: 120 mg/dL — ABNORMAL HIGH (ref 70–99)
Potassium: 5.3 mmol/L — ABNORMAL HIGH (ref 3.5–5.1)
Sodium: 132 mmol/L — ABNORMAL LOW (ref 135–145)
Total Bilirubin: 0.4 mg/dL (ref 0.3–1.2)
Total Protein: 6.8 g/dL (ref 6.5–8.1)

## 2019-01-01 MED ORDER — SODIUM CHLORIDE 0.9 % IV SOLN
240.0000 mg | Freq: Once | INTRAVENOUS | Status: AC
  Administered 2019-01-01: 240 mg via INTRAVENOUS
  Filled 2019-01-01: qty 24

## 2019-01-01 MED ORDER — HEPARIN SOD (PORK) LOCK FLUSH 100 UNIT/ML IV SOLN
500.0000 [IU] | Freq: Once | INTRAVENOUS | Status: AC | PRN
  Administered 2019-01-01: 16:00:00 500 [IU]
  Filled 2019-01-01: qty 5

## 2019-01-01 MED ORDER — SODIUM CHLORIDE 0.9 % IV SOLN
Freq: Once | INTRAVENOUS | Status: AC
  Administered 2019-01-01: 14:00:00 via INTRAVENOUS
  Filled 2019-01-01: qty 250

## 2019-01-01 MED ORDER — SODIUM CHLORIDE 0.9% FLUSH
10.0000 mL | Freq: Once | INTRAVENOUS | Status: AC
  Administered 2019-01-01: 10 mL
  Filled 2019-01-01: qty 10

## 2019-01-01 MED ORDER — SODIUM CHLORIDE 0.9% FLUSH
10.0000 mL | INTRAVENOUS | Status: DC | PRN
  Administered 2019-01-01: 10 mL
  Filled 2019-01-01: qty 10

## 2019-01-01 NOTE — Patient Instructions (Signed)
[facility] Discharge Instructions for Patients Receiving Chemotherapy  Today you received the following chemotherapy agents Nivolumab (OPDIVO).  To help prevent nausea and vomiting after your treatment, we encourage you to take your nausea medication as prescribed.   If you develop nausea and vomiting that is not controlled by your nausea medication, call the clinic.   BELOW ARE SYMPTOMS THAT SHOULD BE REPORTED IMMEDIATELY:  *FEVER GREATER THAN 100.5 F  *CHILLS WITH OR WITHOUT FEVER  NAUSEA AND VOMITING THAT IS NOT CONTROLLED WITH YOUR NAUSEA MEDICATION  *UNUSUAL SHORTNESS OF BREATH  *UNUSUAL BRUISING OR BLEEDING  TENDERNESS IN MOUTH AND THROAT WITH OR WITHOUT PRESENCE OF ULCERS  *URINARY PROBLEMS  *BOWEL PROBLEMS  UNUSUAL RASH Items with * indicate a potential emergency and should be followed up as soon as possible.  Feel free to call the clinic should you have any questions or concerns. The clinic phone number is [phone].  Please show the CHEMO ALERT CARD at check-in to the Emergency Department and triage nurse.  Coronavirus (COVID-19) Are you at risk?  Are you at risk for the Coronavirus (COVID-19)?  To be considered HIGH RISK for Coronavirus (COVID-19), you have to meet the following criteria:  . Traveled to [place], [place], [name], [place] or [place]; or in the [place] to [place], [place], [place], or [place]; and have fever, cough, and shortness of breath within the last 2 weeks of travel OR . Been in close contact with a person diagnosed with COVID-19 within the last 2 weeks and have fever, cough, and shortness of breath . IF YOU DO NOT MEET THESE CRITERIA, YOU ARE CONSIDERED LOW RISK FOR COVID-19.  What to do if you are HIGH RISK for COVID-19?  . If you are having a medical emergency, call 911. . Seek medical care right away. Before you go to a doctor's office, urgent care or emergency department, call ahead and tell them  about your recent travel, contact with someone diagnosed with COVID-19, and your symptoms. You should receive instructions from your physician's office regarding next steps of care.  . When you arrive at healthcare provider, tell the healthcare staff immediately you have returned from visiting [place], [place], [place], [place] or [name]; or traveled in the [place] to [place], [place], [place], or [place]; in the last two weeks or you have been in close contact with a person diagnosed with COVID-19 in the last 2 weeks.   . Tell the health care staff about your symptoms: fever, cough and shortness of breath. . After you have been seen by a medical provider, you will be either: o Tested for (COVID-19) and discharged home on quarantine except to seek medical care if symptoms worsen, and asked to  - Stay home and avoid contact with others until you get your results (4-5 days)  - Avoid travel on public transportation if possible (such as bus, train, or airplane) or o Sent to the Emergency Department by EMS for evaluation, COVID-19 testing, and possible admission depending on your condition and test results.  What to do if you are LOW RISK for COVID-19?  Reduce your risk of any infection by using the same precautions used for avoiding the common cold or flu:  . Wash your hands often with soap and warm water for at least 20 seconds.  If soap and water are not readily available, use an alcohol-based hand sanitizer with at least 60% alcohol.  . If coughing or   sneezing, cover your mouth and nose by coughing or sneezing into the elbow areas of your shirt or coat, into a tissue or into your sleeve (not your hands). . Avoid shaking hands with others and consider head nods or verbal greetings only. . Avoid touching your eyes, nose, or mouth with unwashed hands.  . Avoid close contact with people who are sick. . Avoid places or events with large numbers of people in one location, like concerts or  sporting events. . Carefully consider travel plans you have or are making. . If you are planning any travel outside or inside the [place], visit the CDC's Travelers' Health webpage for the latest health notices. . If you have some symptoms but not all symptoms, continue to monitor at home and seek medical attention if your symptoms worsen. . If you are having a medical emergency, call 911.   ADDITIONAL HEALTHCARE OPTIONS FOR PATIENTS  [facility] / e-Visit: [email or web address]         [facility]: [phone]  [facility]: [phone]                   [facility]: [phone]    

## 2019-01-04 ENCOUNTER — Encounter (HOSPITAL_COMMUNITY)
Admission: RE | Admit: 2019-01-04 | Discharge: 2019-01-04 | Disposition: A | Discharge status: Home / Self Care | Payer: Medicare Other | Source: Ambulatory Visit | Attending: Hematology | Admitting: Hematology

## 2019-01-04 ENCOUNTER — Other Ambulatory Visit: Payer: Self-pay

## 2019-01-04 DIAGNOSIS — N8110 Cystocele, unspecified: Secondary | ICD-10-CM | POA: Insufficient documentation

## 2019-01-04 DIAGNOSIS — C642 Malignant neoplasm of left kidney, except renal pelvis: Secondary | ICD-10-CM | POA: Insufficient documentation

## 2019-01-04 DIAGNOSIS — N816 Rectocele: Secondary | ICD-10-CM | POA: Insufficient documentation

## 2019-01-04 DIAGNOSIS — I2510 Atherosclerotic heart disease of native coronary artery without angina pectoris: Secondary | ICD-10-CM | POA: Insufficient documentation

## 2019-01-04 LAB — GLUCOSE, CAPILLARY: Glucose-Capillary: 88 mg/dL (ref 70–99)

## 2019-01-04 MED ORDER — TECHNETIUM TC 99M MEBROFENIN IV KIT: 6.1000 | PACK | Freq: Once | INTRAVENOUS | Status: DC | PRN

## 2019-01-04 MED ORDER — FLUDEOXYGLUCOSE F - 18 (FDG) INJECTION
6.1000 | Freq: Once | INTRAVENOUS | Status: AC | PRN
  Administered 2019-01-04: 6.1 via INTRAVENOUS

## 2019-01-08 ENCOUNTER — Other Ambulatory Visit: Payer: Self-pay | Admitting: Gastroenterology

## 2019-01-11 ENCOUNTER — Telehealth: Payer: Self-pay | Admitting: Hematology

## 2019-01-15 ENCOUNTER — Inpatient Hospital Stay: Payer: Medicare Other

## 2019-01-15 ENCOUNTER — Telehealth: Payer: Self-pay | Admitting: Hematology

## 2019-01-15 ENCOUNTER — Other Ambulatory Visit: Payer: Self-pay

## 2019-01-15 ENCOUNTER — Inpatient Hospital Stay (HOSPITAL_BASED_OUTPATIENT_CLINIC_OR_DEPARTMENT_OTHER): Payer: Medicare Other | Admitting: Hematology

## 2019-01-15 VITALS — BP 140/80 | HR 75 | Temp 98.5°F | Resp 17 | Ht 59.0 in | Wt 129.4 lb

## 2019-01-15 DIAGNOSIS — C7951 Secondary malignant neoplasm of bone: Secondary | ICD-10-CM

## 2019-01-15 DIAGNOSIS — C642 Malignant neoplasm of left kidney, except renal pelvis: Secondary | ICD-10-CM

## 2019-01-15 DIAGNOSIS — I2510 Atherosclerotic heart disease of native coronary artery without angina pectoris: Secondary | ICD-10-CM | POA: Diagnosis not present

## 2019-01-15 DIAGNOSIS — Z7189 Other specified counseling: Secondary | ICD-10-CM

## 2019-01-15 DIAGNOSIS — Z5112 Encounter for antineoplastic immunotherapy: Secondary | ICD-10-CM | POA: Diagnosis not present

## 2019-01-15 DIAGNOSIS — Z95828 Presence of other vascular implants and grafts: Secondary | ICD-10-CM

## 2019-01-15 LAB — CMP (CANCER CENTER ONLY)
ALT: 23 U/L (ref 0–44)
AST: 20 U/L (ref 15–41)
Albumin: 3.6 g/dL (ref 3.5–5.0)
Alkaline Phosphatase: 132 U/L — ABNORMAL HIGH (ref 38–126)
Anion gap: 8 (ref 5–15)
BUN: 26 mg/dL — ABNORMAL HIGH (ref 8–23)
CO2: 25 mmol/L (ref 22–32)
Calcium: 9.6 mg/dL (ref 8.9–10.3)
Chloride: 104 mmol/L (ref 98–111)
Creatinine: 1.54 mg/dL — ABNORMAL HIGH (ref 0.44–1.00)
GFR, Est AFR Am: 38 mL/min — ABNORMAL LOW (ref >60)
GFR, Estimated: 32 mL/min — ABNORMAL LOW (ref >60)
Glucose, Bld: 123 mg/dL — ABNORMAL HIGH (ref 70–99)
Potassium: 5.3 mmol/L — ABNORMAL HIGH (ref 3.5–5.1)
Sodium: 137 mmol/L (ref 135–145)
Total Bilirubin: 0.4 mg/dL (ref 0.3–1.2)
Total Protein: 6.6 g/dL (ref 6.5–8.1)

## 2019-01-15 LAB — CBC WITH DIFFERENTIAL/PLATELET
Abs Immature Granulocytes: 0.09 10*3/uL — ABNORMAL HIGH (ref 0.00–0.07)
Basophils Absolute: 0.1 10*3/uL (ref 0.0–0.1)
Basophils Relative: 1 %
Eosinophils Absolute: 0.1 10*3/uL (ref 0.0–0.5)
Eosinophils Relative: 1 %
HCT: 35.3 % — ABNORMAL LOW (ref 36.0–46.0)
Hemoglobin: 11.1 g/dL — ABNORMAL LOW (ref 12.0–15.0)
Immature Granulocytes: 1 %
Lymphocytes Relative: 14 %
Lymphs Abs: 1.2 10*3/uL (ref 0.7–4.0)
MCH: 28.5 pg (ref 26.0–34.0)
MCHC: 31.4 g/dL (ref 30.0–36.0)
MCV: 90.7 fL (ref 80.0–100.0)
Monocytes Absolute: 0.9 10*3/uL (ref 0.1–1.0)
Monocytes Relative: 10 %
Neutro Abs: 6.3 10*3/uL (ref 1.7–7.7)
Neutrophils Relative %: 73 %
Platelets: 266 10*3/uL (ref 150–400)
RBC: 3.89 MIL/uL (ref 3.87–5.11)
RDW: 14.9 % (ref 11.5–15.5)
WBC: 8.7 10*3/uL (ref 4.0–10.5)
nRBC: 0 % (ref 0.0–0.2)

## 2019-01-15 MED ORDER — SODIUM CHLORIDE 0.9% FLUSH
10.0000 mL | Freq: Once | INTRAVENOUS | Status: AC
  Administered 2019-01-15: 10 mL
  Filled 2019-01-15: qty 10

## 2019-01-15 MED ORDER — SODIUM CHLORIDE 0.9 % IV SOLN
Freq: Once | INTRAVENOUS | Status: AC
  Administered 2019-01-15: 12:00:00 via INTRAVENOUS
  Filled 2019-01-15: qty 250

## 2019-01-15 MED ORDER — ALTEPLASE 2 MG IJ SOLR
2.0000 mg | Freq: Once | INTRAMUSCULAR | Status: DC
  Filled 2019-01-15: qty 2

## 2019-01-15 MED ORDER — SODIUM CHLORIDE 0.9% FLUSH
10.0000 mL | INTRAVENOUS | Status: DC | PRN
  Administered 2019-01-15: 13:00:00 10 mL
  Filled 2019-01-15: qty 10

## 2019-01-15 MED ORDER — SODIUM CHLORIDE 0.9 % IV SOLN
240.0000 mg | Freq: Once | INTRAVENOUS | Status: AC
  Administered 2019-01-15: 240 mg via INTRAVENOUS
  Filled 2019-01-15: qty 24

## 2019-01-15 MED ORDER — HEPARIN SOD (PORK) LOCK FLUSH 100 UNIT/ML IV SOLN
500.0000 [IU] | Freq: Once | INTRAVENOUS | Status: AC | PRN
  Administered 2019-01-15: 13:00:00 500 [IU]
  Filled 2019-01-15: qty 5

## 2019-01-15 MED ORDER — DENOSUMAB 120 MG/1.7ML ~~LOC~~ SOLN
SUBCUTANEOUS | Status: AC
  Filled 2019-01-15: qty 1.7

## 2019-01-15 MED ORDER — ALTEPLASE 2 MG IJ SOLR
INTRAMUSCULAR | Status: AC
  Filled 2019-01-15: qty 2

## 2019-01-15 MED ORDER — DENOSUMAB 120 MG/1.7ML ~~LOC~~ SOLN
120.0000 mg | Freq: Once | SUBCUTANEOUS | Status: AC
  Administered 2019-01-15: 120 mg via SUBCUTANEOUS

## 2019-01-15 NOTE — Patient Instructions (Signed)

## 2019-01-15 NOTE — Telephone Encounter (Signed)
Scheduled appt per 9/25 los

## 2019-01-15 NOTE — Progress Notes (Signed)
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 She is here for C23D1 of Nivolumab. The patient's last visit with [name] was on 12/18/2018. The pt reports that she is doing well overall.  The pt reports no new concerns or issues with her treatment at this time. She is interested in getting a uterine prolapse surgery as she is having a issues fully emptying her bladder and bowels. She is having concerns over her husband's and her daughter's health. She is still doing some work with [employer]. Pt attends service every Sunday.   Of note since the patient's last visit, pt has had PET scan completed on 01/04/2019 with results revealing "No findings suspicious for recurrent or progressive metastatic renal cell carcinoma. Stable lytic and sclerotic lesions in the thoracic spine. Low-level hypermetabolic activity in the porta hepatis along the superior aspect of the patient's recently exchanged biliary stent, likely reactive. No discrete adenopathy seen in this area. Stable incidental findings including extensive coronary and aortic Atherosclerosis (ICD10-I70.0). Rectocele and cystocele noted."  Lab results today (01/15/19) of CBC w/diff and CMP is as follows: all values are WNL except for Hgb at 11.1, HCT at 35.3, Abs Immature Granulocytes at 0.09K, Potassium at 5.3, Glucose at 123, BUN at 26, Creatinine at 1.54, Alkaline Phosphatase at 132, GFR Est Non Af Am at 32.  On review of systems, pt denies diarrhea, skin rashes, mouth sores, fevers, chills,  infections, change in energy levels and any other symptoms.   MEDICAL HISTORY:   Past Medical History:  Diagnosis Date   Anemia    Arthritis    lower back, hips, hands   Biliary stricture    Diabetes mellitus ([place])    type 2 - no meds, diet controlled   Early cataracts, bilateral    Elevated liver enzymes    GERD (gastroesophageal reflux disease)    occasional - diet controlled   History of hiatal hernia    HTN (hypertension)    Hyperlipidemia    Hypothyroidism    left renal ca dx'd 2018   renal cancer - left kidney removed, pill chemo x 1 yr   Myocardial infarction ([place]) 1991   no deficits   SVD (spontaneous vaginal delivery)    x 3   Wears glasses    Dyslipidemia Osteoarthritis Ex-smoker Coronary artery disease Thyroid disorder-was apparently on levothyroxine 25 g daily which has subsequently been discontinued . Mitral regurgitation B12 deficiency  hiatal hernia with esophagitis   SURGICAL HISTORY: Past Surgical History:  Procedure Laterality Date   BALLOON DILATION N/A 07/23/2018   Procedure: BALLOON DILATION;  Surgeon: Mansouraty, Telford Nab., MD;  Location: [place];  Service: Gastroenterology;  Laterality: N/A;   BILIARY BRUSHING  08/10/2018   Procedure: BILIARY BRUSHING;  Surgeon: Rush Landmark Telford Nab., MD;  Location: [place];  Service: Gastroenterology;;   BILIARY BRUSHING  11/30/2018   Procedure: BILIARY BRUSHING;  Surgeon: Irving Copas., MD;  Location: [facility];  Service: Gastroenterology;;   BILIARY DILATION  08/10/2018   Procedure: BILIARY DILATION;  Surgeon: Irving Copas.,  MD;  Location: [place];  Service: Gastroenterology;;   BILIARY DILATION  08/27/2018   Procedure: BILIARY DILATION;  Surgeon: Irving Copas., MD;  Location: [place];  Service: Gastroenterology;;   BILIARY STENT PLACEMENT  08/10/2018   Procedure: BILIARY STENT PLACEMENT;  Surgeon: Irving Copas., MD;  Location:  [place];  Service: Gastroenterology;;   BILIARY STENT PLACEMENT  08/27/2018   Procedure: BILIARY STENT PLACEMENT;  Surgeon: Irving Copas., MD;  Location: [place];  Service: Gastroenterology;;   BILIARY STENT PLACEMENT  11/30/2018   Procedure: BILIARY STENT PLACEMENT;  Surgeon: Irving Copas., MD;  Location: [place];  Service: Gastroenterology;;   BIOPSY  07/23/2018   Procedure: BIOPSY;  Surgeon: Irving Copas., MD;  Location: [place];  Service: Gastroenterology;;   CHOLECYSTECTOMY N/A 09/02/2018   Procedure: LAPAROSCOPIC CHOLECYSTECTOMY;  Surgeon: Stark Klein, MD;  Location: [place];  Service: General;  Laterality: N/A;   COLONOSCOPY     normal    ENDOSCOPIC RETROGRADE CHOLANGIOPANCREATOGRAPHY (ERCP) WITH PROPOFOL N/A 08/10/2018   Procedure: ENDOSCOPIC RETROGRADE CHOLANGIOPANCREATOGRAPHY (ERCP) WITH PROPOFOL;  Surgeon: Irving Copas., MD;  Location: [place];  Service: Gastroenterology;  Laterality: N/A;   ENDOSCOPIC RETROGRADE CHOLANGIOPANCREATOGRAPHY (ERCP) WITH PROPOFOL N/A 11/30/2018   Procedure: ENDOSCOPIC RETROGRADE CHOLANGIOPANCREATOGRAPHY (ERCP) WITH PROPOFOL;  Surgeon: Rush Landmark Telford Nab., MD;  Location: [place];  Service: Gastroenterology;  Laterality: N/A;   ERCP N/A 07/23/2018   Procedure: ENDOSCOPIC RETROGRADE CHOLANGIOPANCREATOGRAPHY (ERCP);  Surgeon: Irving Copas., MD;  Location: [place];  Service: Gastroenterology;  Laterality: N/A;   ERCP N/A 08/27/2018   Procedure: ENDOSCOPIC RETROGRADE CHOLANGIOPANCREATOGRAPHY (ERCP);  Surgeon: Irving Copas., MD;  Location: [place];  Service: Gastroenterology;  Laterality: N/A;   ESOPHAGOGASTRODUODENOSCOPY (EGD) WITH PROPOFOL N/A 08/27/2018   Procedure: ESOPHAGOGASTRODUODENOSCOPY (EGD) WITH PROPOFOL;  Surgeon: Rush Landmark Telford Nab., MD;  Location: [place];  Service: Gastroenterology;  Laterality: N/A;   EUS N/A 08/27/2018   Procedure:  ESOPHAGEAL ENDOSCOPIC ULTRASOUND (EUS) RADIAL;  Surgeon: Rush Landmark Telford Nab., MD;  Location: [place];  Service: Gastroenterology;  Laterality: N/A;   FINE NEEDLE ASPIRATION  08/27/2018   Procedure: FINE NEEDLE ASPIRATION (FNA) LINEAR;  Surgeon: Irving Copas., MD;  Location: [place];  Service: Gastroenterology;;   IR IMAGING GUIDED PORT INSERTION  01/08/2018   LAPAROSCOPIC NEPHRECTOMY Left 08/01/2016   Procedure: LAPAROSCOPIC  RADICAL NEPHRECTOMY/ REPAIR OF UMBILICAL HERNIA;  Surgeon: Raynelle Bring, MD;  Location: [facility];  Service: Urology;  Laterality: Left;   REMOVAL OF STONES  07/23/2018   Procedure: REMOVAL OF GALL STONES;  Surgeon: Rush Landmark Telford Nab., MD;  Location: [place];  Service: Gastroenterology;;   REMOVAL OF STONES  08/10/2018   Procedure: REMOVAL OF STONES;  Surgeon: Irving Copas., MD;  Location: [place];  Service: Gastroenterology;;   REMOVAL OF STONES  08/27/2018   Procedure: REMOVAL OF STONES;  Surgeon: Irving Copas., MD;  Location: [place];  Service: Gastroenterology;;   [name]  07/23/2018   Procedure: [name];  Surgeon: Irving Copas., MD;  Location: [place];  Service: Gastroenterology;;   Lavell Islam REMOVAL  08/27/2018   Procedure: STENT REMOVAL;  Surgeon: Irving Copas., MD;  Location: [place];  Service: Gastroenterology;;   Lavell Islam REMOVAL  11/30/2018   Procedure: STENT REMOVAL;  Surgeon: Irving Copas., MD;  Location: [place];  Service: Gastroenterology;;   UPPER GI ENDOSCOPY     x 1   No reported past surgeries EGD 05/01/2016 Dr. Earley Brooke Colonoscopy 03/2016 Dr. Earley Brooke  SOCIAL HISTORY:  Social History   Socioeconomic History   Marital status: Married    Spouse name: Not on file   Number of children: 3   Years of education: Not on file   Highest education level: Not on file  Occupational History   Occupation: retired  Armed forces operational officer strain: Not on file   Food insecurity    Worry: Not on file    Inability: Not on Lexicographer needs    Medical: Not on file    Non-medical: Not on file  Tobacco Use   Smoking status: Former Smoker    Packs/day: 1.00    Years: 8.00    Pack years: 8.00    Types: Cigarettes    Quit date: 06/18/1964    Years since quitting: 54.6   Smokeless tobacco: Never Used  Substance and Sexual Activity   Alcohol use: No   Drug use: No   Sexual activity: Not on file  Lifestyle   Physical activity    Days per week: Not on file    Minutes per session: Not on file   Stress: Not on file  Relationships   Social connections    Talks on phone: Not on file    Gets together: Not on file    Attends religious service: Not on file    Active member of club or organization: Not on file    Attends meetings of clubs or organizations: Not on file    Relationship status: Not on file   Intimate partner violence    Fear of current or ex partner: Not on file    Emotionally abused: Not on file    Physically abused: Not on file    Forced sexual activity: Not on file  Other Topics Concern   Not on file  Social History Narrative   Married.  Three children   Patient lives in [place] Works as a Solicitor part-time Ex-smoker quit long time ago In 1961 . Smoked 2 packs per day for 8 years prior to that .  or a lot of stress since her daughter is also getting treated for stage IV uterine cancer.  FAMILY HISTORY:  Mother deceased Father with asthma and emphysema died at 36 years with the MI. Brother deceased of heart disease   ALLERGIES:  is allergic to adhesive [tape]; gabapentin; nsaids; and statins.  MEDICATIONS:  Current Outpatient Medications  Medication Sig Dispense Refill   amLODipine (NORVASC) 5 MG tablet Take 5 mg by mouth daily.   6   atorvastatin (LIPITOR) 40 MG tablet Take 40 mg by mouth at bedtime.      carvedilol (COREG) 6.25 MG tablet Take  6.25 mg by mouth 2 (two) times daily.  6   clobetasol ointment (TEMOVATE) [id number] % Apply 1 application topically 2 (two) times daily as needed (lichen sclerosis).      Denosumab (XGEVA [place]) Inject into the skin every 6 (six) weeks.     hydrocortisone (CORTEF) 10 MG tablet Take 5-10 mg by mouth See admin instructions. Take 1 tablet (10 mg) by mouth in the morning & 1/2 tablet (5 mg) by mouth in the evening.     levothyroxine (SYNTHROID) 75 MCG tablet Take 75 mcg by mouth daily before breakfast.      lidocaine (LIDODERM) 5 % Place 1 patch onto the skin daily. Remove & Discard patch within 12 hours or as directed by MD Using on Right Hip 30 patch 0   lidocaine-prilocaine (EMLA)  cream Apply to affected area once (Patient taking differently: Apply 1 application topically daily as needed (prior to port access). ) 30 g 3   Nivolumab (OPDIVO IV) Inject 1 Dose into the vein every 14 (fourteen) days. [place]     omeprazole (PRILOSEC) 20 MG capsule Take 1 capsule by mouth twice daily 60 capsule 0   repaglinide (PRANDIN) 0.5 MG tablet Take 0.5 mg by mouth 2 (two) times daily before a meal.     No current facility-administered medications for this visit.    Facility-Administered Medications Ordered in Other Visits  Medication Dose Route Frequency Provider Last Rate Last Dose   sodium chloride flush (NS) 0.9 % injection 10 mL  10 mL Intracatheter PRN Truitt Merle, MD   10 mL at 11/20/18 1232    REVIEW OF SYSTEMS:   A 10+ POINT REVIEW OF SYSTEMS WAS OBTAINED including neurology, dermatology, psychiatry, cardiac, respiratory, lymph, extremities, GI, GU, Musculoskeletal, constitutional, breasts, reproductive, HEENT.  All pertinent positives are noted in the HPI.  All others are negative.    PHYSICAL EXAMINATION:  ECOG FS:2 - Symptomatic, <50% confined to bed  There were no vitals filed for this visit. Wt Readings from Last 3 Encounters:  12/18/18 125 lb 9.6 oz (57 kg)  12/04/18 124 lb 12 oz  (56.6 kg)  11/30/18 129 lb (58.5 kg)   There is no height or weight on file to calculate BMI.    GENERAL:alert, in no acute distress and comfortable SKIN: no acute rashes, no significant lesions EYES: conjunctiva are pink and non-injected, sclera anicteric OROPHARYNX: MMM, no exudates, no oropharyngeal erythema or ulceration NECK: supple, no JVD LYMPH:  no palpable lymphadenopathy in the cervical, axillary or inguinal regions LUNGS: clear to auscultation b/l with normal respiratory effort HEART: regular rate & rhythm ABDOMEN:  normoactive bowel sounds , non tender, not distended. Extremity: no pedal edema PSYCH: alert & oriented x 3 with fluent speech NEURO: no focal motor/sensory deficits     LABORATORY DATA:  I have reviewed the data as listed  . CBC Latest Ref Rng & Units 01/01/2019 12/18/2018 12/04/2018  WBC 4.0 - 10.5 K/uL 9.2 8.3 9.5  Hemoglobin 12.0 - 15.0 g/dL 11.1(L) 10.7(L) 11.7(L)  Hematocrit 36.0 - 46.0 % 35.0(L) 33.2(L) 36.9  Platelets 150 - 400 K/uL 292 252 292    CBC    Component Value Date/Time   WBC 9.2 01/01/2019 1153   RBC 3.89 01/01/2019 1153   HGB 11.1 (L) 01/01/2019 1153   HGB 11.4 (L) 09/11/2018 0900   HGB 11.2 (L) 04/04/2017 0830   HCT 35.0 (L) 01/01/2019 1153   HCT 35.2 04/04/2017 0830   PLT 292 01/01/2019 1153   PLT 277 09/11/2018 0900   PLT 250 04/04/2017 0830   MCV 90.0 01/01/2019 1153   MCV 107.6 (H) 04/04/2017 0830   MCH 28.5 01/01/2019 1153   MCHC 31.7 01/01/2019 1153   RDW 14.6 01/01/2019 1153   RDW 14.0 04/04/2017 0830   LYMPHSABS 1.4 01/01/2019 1153   LYMPHSABS 1.6 04/04/2017 0830   MONOABS 1.0 01/01/2019 1153   MONOABS 0.4 04/04/2017 0830   EOSABS 0.1 01/01/2019 1153   EOSABS 0.1 04/04/2017 0830   BASOSABS 0.1 01/01/2019 1153   BASOSABS 0.0 04/04/2017 0830     . CMP Latest Ref Rng & Units 01/01/2019 12/18/2018 12/04/2018  Glucose 70 - 99 mg/dL 120(H) 147(H) 149(H)  BUN 8 - 23 mg/dL 24(H) 22 22  Creatinine 0.44 - 1.00 mg/dL  1.41(H) 1.35(H) 1.55(H)  Sodium 135 - 145 mmol/L 132(L) 136 136  Potassium 3.5 - 5.1 mmol/L 5.3(H) 5.1 4.8  Chloride 98 - 111 mmol/L 101 106 102  CO2 22 - 32 mmol/L 23 20(L) 24  Calcium 8.9 - 10.3 mg/dL 8.8(L) 8.6(L) 9.3  Total Protein 6.5 - 8.1 g/dL 6.8 6.7 6.8  Total Bilirubin 0.3 - 1.2 mg/dL 0.4 0.5 0.6  Alkaline Phos 38 - 126 U/L 169(H) 149(H) 147(H)  AST 15 - 41 U/L 31 28 49(H)  ALT 0 - 44 U/L 34 39 67(H)   B12 level 299 (OSH)-->455  . Lab Results  Component Value Date   LDH 136 03/13/2018        08/27/18 Surgical Pathology from Cholecystectomy:         RADIOGRAPHIC STUDIES: I have personally reviewed the radiological images as listed and agreed with the findings in the report. Nm Pet Image Restag (ps) Skull Base To Thigh  Result Date: 01/03/2017 IMPRESSION: 1. There is a new lesion in the hepatic dome which is FDG avid and low in attenuation on CT imaging consistent with metastatic disease. Another region of uptake in the left hepatic lobe posteriorly demonstrates no CT correlate. A second subtle metastasis is not excluded on today's study. An MRI could better assess for other hepatic metastases if clinically warranted. 2. New metastatic lesion in the left side of T8. 3. New pulmonary nodule in the right upper lobe. This is too small to characterize but suspicious. Recommend attention on follow-up. No FDG avid nodules or other enlarging nodules. 4. The uptake at the previous left renal artery has almost resolved in the interval and is favored to be post therapeutic. Recommend attention on follow-up. 5. The metastasis in the posterior right hilum seen previously has resolved. Electronically Signed   By: Dorise Bullion III M.D   On: 01/03/2017 11:16   .Nm Pet Image Restag (ps) Skull Base To Thigh  Result Date: 01/04/2019 CLINICAL DATA:  Subsequent treatment strategy for metastatic renal cell carcinoma. EXAM: NUCLEAR MEDICINE PET SKULL BASE TO THIGH TECHNIQUE: 6.1 mCi F-18  FDG was injected intravenously. Full-ring PET imaging was performed from the skull base to thigh after the radiotracer. CT data was obtained and used for attenuation correction and anatomic localization. Fasting blood glucose: 88 mg/dl COMPARISON:  Abdominopelvic CT 09/18/2018. PET-CT 04/03/2018. ERCP 11/30/2018. FINDINGS: Mediastinal blood pool activity: SUV max 2.5 Liver activity: SUV max 3.7 NECK: No hypermetabolic cervical lymph nodes are identified.There are no lesions of the pharyngeal mucosal space. Incidental CT findings: Bilateral carotid atherosclerosis. CHEST: There are no hypermetabolic mediastinal, hilar or axillary lymph nodes. No suspicious pulmonary activity or nodularity. Incidental CT findings: Right IJ Port-A-Cath extends to the superior cavoatrial junction. There is diffuse atherosclerosis of the aorta, great vessels and coronary arteries. Stable mild scarring at both lung bases. A 5 mm right middle lobe nodule on image 39/8 is stable. ABDOMEN/PELVIS: There is no hypermetabolic activity within the liver, adrenal glands, spleen or pancreas. There is no hypermetabolic nodal activity. There is low-level hypermetabolic activity in the porta hepatis along the superior aspect the biliary stent (SUV max 4.6), likely reactive. Incidental CT findings: Since the previous PET-CT, the patient has undergone cholecystectomy and biliary stenting. There is a new metallic biliary stent with pneumobilia. Status post left nephrectomy. The right kidney has a stable appearance. Cystocele and rectocele again noted. SKELETON: There is no hypermetabolic activity to suggest osseous metastatic disease. There are stable lucent and sclerotic lesions in the thoracic spine. Prominent muscular  activity is noted at both shoulders, within physiologic limits. Incidental CT findings: Stable multilevel spondylosis and bilateral sacroiliac arthropathy. IMPRESSION: 1. No findings suspicious for recurrent or progressive metastatic  renal cell carcinoma. Stable lytic and sclerotic lesions in the thoracic spine. 2. Low-level hypermetabolic activity in the porta hepatis along the superior aspect of the patient's recently exchanged biliary stent, likely reactive. No discrete adenopathy seen in this area. 3. Stable incidental findings including extensive coronary and aortic Atherosclerosis (ICD10-I70.0). Rectocele and cystocele noted. Electronically Signed   By: Richardean Sale M.D.   On: 01/04/2019 11:50    ASSESSMENT & PLAN:    78 y.o. 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 PET/CT 06/19/2017 -- consistent with partial metabolic response to treatment.  Rt adrenal gland bx - showed clear cell RCC  S/p CYtoreductive left radical nephrectomy and left adrenal gland resection on 08/01/2016 by Dr Alinda Money.  10/16/17 PET/CT revealed Continued improvement, with the T7 metastatic lesion no longer significantly hypermetabolic. The previous right lower lobe pulmonary nodule is even less apparent, perhaps about 2 mm in diameter today; given that this measured 6 mm on 03/28/2017 this probably represents an effectively treated metastatic lesion. Currently no appreciable hypermetabolic activity is identified to suggest active malignancy. Distended gallbladder with gallbladder wall thickening and gallstones. Correlate clinically in assessing for cholecystitis. Small but abnormal amount of free pelvic fluid, nonspecific. Other imaging findings of potential clinical significance: Chronic ethmoid sinusitis. Aortic Atherosclerosis. Stable 5 mm right middle lobe pulmonary nodule, not hypermetabolic but below sensitive PET-CT size thresholds. Left nephrectomy. Notable pelvic floor laxity with cystocele. Chronic bilateral Sacroiliitis.  04/03/18 PET/CT revealed No evidence for new or progressive hypermetabolic disease on  today's study to suggest metastatic progression. 2. Stable appearance of the T7 metastatic lesion without Hypermetabolism. 3. Tiny focus of FDG accumulation identified along the skin of the low right inguinal fold. No associated lesion evident on CT. This may be related to urinary contaminant. 4. Cholelithiasis with similar appearance of diffuse gallbladder wall thickening. 5. Stable 5 mm right middle lobe pulmonary nodule. 6.  Aortic Atherosclerois. 7. Diffuse colonic diverticulosis.   09/18/18 CT A/P revealed "Common duct stent in place. Improvement to resolution in biliary duct dilatation compared to 08/29/2018. Interval cholecystectomy without acute complication. 2. Left nephrectomy, without evidence of metastatic disease. 3.  Tiny hiatal hernia. 4. Coronary artery atherosclerosis. Aortic Atherosclerosis. 5. Mild limitations secondary to lack of IV contrast. 6. Marked pelvic floor laxity with cystocele and rectal prolapse".  #2 b/l adrenal metastases from [facility] s/p left adrenalectomy with adrenal insuff - follows with Dr Buddy Duty.  #3 Small pulmonary lesions -- 10/16/17 PET/CT showed pulmonary less apparent than 03/28/17 PET/CT, decreasing from 92mm to 27mm diameter.   MRI brain shows no evidence of metastatic disease  #4 T7/8 Bone metastases - received Xgeva every 4 weeks from May 2018 to June 2019. 10/16/17 PET/CT revealed improvement with T7 metastatic lesion no longer significantly hypermetabolic.  -on Marchelle Folks  #5 ?liver mets- rpt PET/CT from 10/16/2017 shows no overt evidence of metastatic disease in the liver.  #6 Grade 1 Nausea - improved and intermittent. Hasnt used her anti-emetic as instructed so some nausea and decreased po intake.  #7 Grade 1 Diarrhea - resolved  #8 Hyponatremia -  resolved with sodium at 136 - likely related to some element of adrenal insufficiency, diarrhea, limited by mouth intake. Primarily solute free  fluid intake.   #9 Acute on chronic renal insufficiency Creatine  stable 1.35  #10 Hyperkalemia due to ACEI + RF- resolved  #11 Hemorrhoids -chronic with some bleeding --Recommended Sitz bath and OTC Anusol or Nupercaine for her hemorrhoid relief. 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 Buddy Duty  #14 HTN - ?control. Patient tends to be anxious and has higher blood pressures in the clinic. She can have increased blood pressure from S[place] as well. Plan: -continued close f/u with her PCP /cardiology regarding the many elements necessary for her care that are not directly related to her oncology care. -ACEI held due to AKI and hyperkalemia - following with cardiology to mx this. Has been started on Amlodipine instead.  #15 Grade 1 mucositis- resolved -Advised the patient to continue use of Magic Mouthwash to aid with nutrition  #16 Newly diagnosed ?CAD - positive cardiac nuclear stress test Following with cardiology and nephrology ? Need for pre-operative cardiac cath  #17 Choledocholithiasis and cholelithiasis 07/10/18 US Abdomen revealed Biliary duct dilatation with 8 mm calculus at the level of the ampulla in the distal common bile duct. 2. Cholelithiasis. No gallbladder wall thickening or pericholecystic fluid. 3. Appearance of the liver raises concern for underlying hepatic cirrhosis. No focal liver lesions are demonstrable. 4.  Left kidney absent. 5.  Small cysts in right kidney. 6.  Aortic Atherosclerosis.  S/p ERCP on 07/23/18 with Dr. Valarie Merino Mansouraty  09/02/18 Cytopathology from ERCP was  suspicious for malignant cells in bile duct, but was not definitive, other two findings were benign.  PLAN:  -Discussed pt labwork today, 01/15/19; all values are WNL except for Hgb at 11.1, HCT at 35.3, Abs Immature Granulocytes at 0.09K, Potassium at 5.3, Glucose at 123, BUN at 26, Creatinine at 1.54, Alkaline Phosphatase at 132, GFR Est Non Af Am at 32. -Discussed 01/04/2019 PET scan with results revealing "No findings suspicious for recurrent or progressive metastatic renal cell carcinoma. Stable lytic and sclerotic lesions in the thoracic spine. Low-level hypermetabolic activity in the porta hepatis along the superior aspect of the patient's recently exchanged biliary stent, likely reactive. No discrete adenopathy seen in this area. Stable incidental findings including extensive coronary and aortic Atherosclerosis (ICD10-I70.0). Rectocele and cystocele noted." -Pt has no prohibitive toxicities from continuing Nivolumab at this time. -Advised pt to look into other non-surgical options for her uterine prolapse  -Recommended to increase water intake and continue to minimize orange juice intake -Continue Xgeva every 6 weeks -Will see back in 6 weeks with labs    FOLLOW UP: Continue Nivolumab with labs q2weeks -please schedule next 4 treatments MD visit with every other treatment Alphonse Guild   The total time spent in the appt was 25 minutes and more than 50% was on counseling and direct patient cares.  All of the patient's questions were answered with apparent satisfaction. The patient knows to call the clinic with any problems, questions or concerns.    Sullivan Lone MD [place] AAHIVMS [facility] Hematology/Oncology Physician [place]  (Office):       [phone] (  Work cell):  [phone] (Fax):           [phone]   I, Yevette Edwards, am acting as a scribe for Dr. Sullivan Lone.   .I have reviewed the above documentation for accuracy and completeness, and I agree  with the above. Brunetta Genera MD

## 2019-01-15 NOTE — Progress Notes (Signed)
Verbal order per Dr.Kale: Ok to treat with today's lab values

## 2019-01-15 NOTE — Patient Instructions (Signed)
[facility] Discharge Instructions for Patients Receiving Chemotherapy  Today you received the following chemotherapy agent: Nivolumab (OPDIVO).  To help prevent nausea and vomiting after your treatment, we encourage you to take your nausea medication as prescribed.   If you develop nausea and vomiting that is not controlled by your nausea medication, call the clinic.   BELOW ARE SYMPTOMS THAT SHOULD BE REPORTED IMMEDIATELY:  *FEVER GREATER THAN 100.5 F  *CHILLS WITH OR WITHOUT FEVER  NAUSEA AND VOMITING THAT IS NOT CONTROLLED WITH YOUR NAUSEA MEDICATION  *UNUSUAL SHORTNESS OF BREATH  *UNUSUAL BRUISING OR BLEEDING  TENDERNESS IN MOUTH AND THROAT WITH OR WITHOUT PRESENCE OF ULCERS  *URINARY PROBLEMS  *BOWEL PROBLEMS  UNUSUAL RASH Items with * indicate a potential emergency and should be followed up as soon as possible.  Feel free to call the clinic should you have any questions or concerns. The clinic phone number is [phone].  Please show the CHEMO ALERT CARD at check-in to the Emergency Department and triage nurse.   

## 2019-01-29 ENCOUNTER — Inpatient Hospital Stay: Payer: Medicare Other | Attending: Hematology

## 2019-01-29 ENCOUNTER — Other Ambulatory Visit: Payer: Self-pay

## 2019-01-29 ENCOUNTER — Inpatient Hospital Stay: Payer: Medicare Other

## 2019-01-29 VITALS — BP 112/60 | HR 63 | Temp 98.2°F | Resp 16

## 2019-01-29 DIAGNOSIS — E785 Hyperlipidemia, unspecified: Secondary | ICD-10-CM | POA: Insufficient documentation

## 2019-01-29 DIAGNOSIS — Z87891 Personal history of nicotine dependence: Secondary | ICD-10-CM | POA: Insufficient documentation

## 2019-01-29 DIAGNOSIS — I2510 Atherosclerotic heart disease of native coronary artery without angina pectoris: Secondary | ICD-10-CM | POA: Diagnosis not present

## 2019-01-29 DIAGNOSIS — I10 Essential (primary) hypertension: Secondary | ICD-10-CM | POA: Insufficient documentation

## 2019-01-29 DIAGNOSIS — C7951 Secondary malignant neoplasm of bone: Secondary | ICD-10-CM | POA: Diagnosis not present

## 2019-01-29 DIAGNOSIS — Z79899 Other long term (current) drug therapy: Secondary | ICD-10-CM | POA: Insufficient documentation

## 2019-01-29 DIAGNOSIS — E119 Type 2 diabetes mellitus without complications: Secondary | ICD-10-CM | POA: Insufficient documentation

## 2019-01-29 DIAGNOSIS — C7800 Secondary malignant neoplasm of unspecified lung: Secondary | ICD-10-CM | POA: Diagnosis not present

## 2019-01-29 DIAGNOSIS — K219 Gastro-esophageal reflux disease without esophagitis: Secondary | ICD-10-CM | POA: Diagnosis not present

## 2019-01-29 DIAGNOSIS — D649 Anemia, unspecified: Secondary | ICD-10-CM | POA: Insufficient documentation

## 2019-01-29 DIAGNOSIS — C642 Malignant neoplasm of left kidney, except renal pelvis: Secondary | ICD-10-CM

## 2019-01-29 DIAGNOSIS — Z7189 Other specified counseling: Secondary | ICD-10-CM

## 2019-01-29 DIAGNOSIS — Z944 Liver transplant status: Secondary | ICD-10-CM | POA: Insufficient documentation

## 2019-01-29 DIAGNOSIS — I252 Old myocardial infarction: Secondary | ICD-10-CM | POA: Diagnosis not present

## 2019-01-29 DIAGNOSIS — Z5112 Encounter for antineoplastic immunotherapy: Secondary | ICD-10-CM | POA: Insufficient documentation

## 2019-01-29 DIAGNOSIS — M1990 Unspecified osteoarthritis, unspecified site: Secondary | ICD-10-CM | POA: Diagnosis not present

## 2019-01-29 DIAGNOSIS — E039 Hypothyroidism, unspecified: Secondary | ICD-10-CM | POA: Insufficient documentation

## 2019-01-29 DIAGNOSIS — K449 Diaphragmatic hernia without obstruction or gangrene: Secondary | ICD-10-CM | POA: Insufficient documentation

## 2019-01-29 DIAGNOSIS — E538 Deficiency of other specified B group vitamins: Secondary | ICD-10-CM | POA: Diagnosis not present

## 2019-01-29 LAB — CBC WITH DIFFERENTIAL/PLATELET
Abs Immature Granulocytes: 0.08 10*3/uL — ABNORMAL HIGH (ref 0.00–0.07)
Basophils Absolute: 0.1 10*3/uL (ref 0.0–0.1)
Basophils Relative: 1 %
Eosinophils Absolute: 0.1 10*3/uL (ref 0.0–0.5)
Eosinophils Relative: 2 %
HCT: 35.5 % — ABNORMAL LOW (ref 36.0–46.0)
Hemoglobin: 11.3 g/dL — ABNORMAL LOW (ref 12.0–15.0)
Immature Granulocytes: 1 %
Lymphocytes Relative: 16 %
Lymphs Abs: 1.3 10*3/uL (ref 0.7–4.0)
MCH: 28.5 pg (ref 26.0–34.0)
MCHC: 31.8 g/dL (ref 30.0–36.0)
MCV: 89.6 fL (ref 80.0–100.0)
Monocytes Absolute: 0.8 10*3/uL (ref 0.1–1.0)
Monocytes Relative: 11 %
Neutro Abs: 5.4 10*3/uL (ref 1.7–7.7)
Neutrophils Relative %: 69 %
Platelets: 262 10*3/uL (ref 150–400)
RBC: 3.96 MIL/uL (ref 3.87–5.11)
RDW: 14.7 % (ref 11.5–15.5)
WBC: 7.8 10*3/uL (ref 4.0–10.5)
nRBC: 0 % (ref 0.0–0.2)

## 2019-01-29 LAB — CMP (CANCER CENTER ONLY)
ALT: 20 U/L (ref 0–44)
AST: 17 U/L (ref 15–41)
Albumin: 3.6 g/dL (ref 3.5–5.0)
Alkaline Phosphatase: 94 U/L (ref 38–126)
Anion gap: 12 (ref 5–15)
BUN: 26 mg/dL — ABNORMAL HIGH (ref 8–23)
CO2: 20 mmol/L — ABNORMAL LOW (ref 22–32)
Calcium: 8.9 mg/dL (ref 8.9–10.3)
Chloride: 105 mmol/L (ref 98–111)
Creatinine: 1.47 mg/dL — ABNORMAL HIGH (ref 0.44–1.00)
GFR, Est AFR Am: 40 mL/min — ABNORMAL LOW (ref >60)
GFR, Estimated: 34 mL/min — ABNORMAL LOW (ref >60)
Glucose, Bld: 143 mg/dL — ABNORMAL HIGH (ref 70–99)
Potassium: 4.5 mmol/L (ref 3.5–5.1)
Sodium: 137 mmol/L (ref 135–145)
Total Bilirubin: 0.5 mg/dL (ref 0.3–1.2)
Total Protein: 6.8 g/dL (ref 6.5–8.1)

## 2019-01-29 MED ORDER — SODIUM CHLORIDE 0.9 % IV SOLN
Freq: Once | INTRAVENOUS | Status: AC
  Administered 2019-01-29: 10:00:00 via INTRAVENOUS
  Filled 2019-01-29: qty 250

## 2019-01-29 MED ORDER — SODIUM CHLORIDE 0.9% FLUSH
10.0000 mL | INTRAVENOUS | Status: DC | PRN
  Administered 2019-01-29: 10 mL
  Filled 2019-01-29: qty 10

## 2019-01-29 MED ORDER — SODIUM CHLORIDE 0.9 % IV SOLN
240.0000 mg | Freq: Once | INTRAVENOUS | Status: AC
  Administered 2019-01-29: 10:00:00 240 mg via INTRAVENOUS
  Filled 2019-01-29: qty 24

## 2019-01-29 MED ORDER — HEPARIN SOD (PORK) LOCK FLUSH 100 UNIT/ML IV SOLN
500.0000 [IU] | Freq: Once | INTRAVENOUS | Status: AC | PRN
  Administered 2019-01-29: 500 [IU]
  Filled 2019-01-29: qty 5

## 2019-01-29 NOTE — Patient Instructions (Signed)
[facility] Discharge Instructions for Patients Receiving Chemotherapy  Today you received the following chemotherapy agents: Nivolumab  To help prevent nausea and vomiting after your treatment, we encourage you to take your nausea medication as directed.    If you develop nausea and vomiting that is not controlled by your nausea medication, call the clinic.   BELOW ARE SYMPTOMS THAT SHOULD BE REPORTED IMMEDIATELY:  *FEVER GREATER THAN 100.5 F  *CHILLS WITH OR WITHOUT FEVER  NAUSEA AND VOMITING THAT IS NOT CONTROLLED WITH YOUR NAUSEA MEDICATION  *UNUSUAL SHORTNESS OF BREATH  *UNUSUAL BRUISING OR BLEEDING  TENDERNESS IN MOUTH AND THROAT WITH OR WITHOUT PRESENCE OF ULCERS  *URINARY PROBLEMS  *BOWEL PROBLEMS  UNUSUAL RASH Items with * indicate a potential emergency and should be followed up as soon as possible.  Feel free to call the clinic should you have any questions or concerns. The clinic phone number is [phone].  Please show the CHEMO ALERT CARD at check-in to the Emergency Department and triage nurse.   

## 2019-02-11 ENCOUNTER — Other Ambulatory Visit: Payer: Self-pay | Admitting: Gastroenterology

## 2019-02-11 NOTE — Progress Notes (Signed)
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 She is here for C23D1 of Nivolumab. The patient's last visit with [name] was on 01/15/2019. The pt reports that she is doing well overall.  The pt reports her last Xgeva shot caused some itching.  She is scheduled to have a heart cath in January 2020.  Lab results today (02/12/19) of CBC w/diff and CMP is as follows: all values are WNL except for Hemoglobin at 11.8,  cmp shows abnormal LFts - discussed with patient.  On review of systems, pt reports weakness in left arm and denies fevers, chills, and any other symptoms.    MEDICAL HISTORY:   Past Medical History:  Diagnosis Date  . Anemia   . Arthritis    lower back, hips, hands  . Biliary stricture   . Diabetes mellitus ([place])    type 2 - no meds, diet controlled  . Early cataracts, bilateral   . Elevated liver enzymes   . GERD (gastroesophageal reflux disease)    occasional - diet controlled  . History of hiatal hernia   . HTN (hypertension)   . Hyperlipidemia   . Hypothyroidism   . left renal ca dx'd 2018   renal cancer - left kidney removed, pill chemo x 1 yr  . Myocardial infarction ([place]) 1991   no deficits  . SVD (spontaneous vaginal delivery)    x 3  . Wears glasses    Dyslipidemia Osteoarthritis Ex-smoker Coronary artery disease Thyroid disorder-was apparently on levothyroxine 25 g daily which has subsequently been discontinued . Mitral regurgitation B12 deficiency  hiatal hernia with esophagitis   SURGICAL  HISTORY: Past Surgical History:  Procedure Laterality Date  . BALLOON DILATION N/A 07/23/2018   Procedure: BALLOON DILATION;  Surgeon: Rush Landmark Telford Nab., MD;  Location: [place];  Service: Gastroenterology;  Laterality: N/A;  . BILIARY BRUSHING  08/10/2018   Procedure: BILIARY BRUSHING;  Surgeon: Rush Landmark Telford Nab., MD;  Location: [place];  Service: Gastroenterology;;  . BILIARY BRUSHING  11/30/2018   Procedure: BILIARY BRUSHING;  Surgeon: Irving Copas., MD;  Location: [place];  Service: Gastroenterology;;  . BILIARY DILATION  08/10/2018   Procedure: BILIARY DILATION;  Surgeon: Irving Copas., MD;  Location: [place];  Service: Gastroenterology;;  . BILIARY DILATION  08/27/2018   Procedure: BILIARY DILATION;  Surgeon: Irving Copas., MD;  Location: [place];  Service: Gastroenterology;;  . BILIARY STENT PLACEMENT  08/10/2018   Procedure: BILIARY STENT PLACEMENT;  Surgeon: Irving Copas., MD;  Location: [place];  Service: Gastroenterology;;  . BILIARY STENT PLACEMENT  08/27/2018   Procedure: BILIARY STENT PLACEMENT;  Surgeon: Irving Copas., MD;  Location: [place];  Service: Gastroenterology;;  . BILIARY STENT PLACEMENT  11/30/2018   Procedure: BILIARY STENT PLACEMENT;  Surgeon: Irving Copas., MD;  Location: [place];  Service: Gastroenterology;;  . BIOPSY  07/23/2018   Procedure: BIOPSY;  Surgeon: Irving Copas., MD;  Location: [place];  Service: Gastroenterology;;  . CHOLECYSTECTOMY N/A 09/02/2018   Procedure: LAPAROSCOPIC CHOLECYSTECTOMY;  Surgeon: Stark Klein, MD;  Location: [place];  Service: General;  Laterality: N/A;  . COLONOSCOPY     normal   . ENDOSCOPIC RETROGRADE CHOLANGIOPANCREATOGRAPHY (ERCP) WITH PROPOFOL N/A 08/10/2018   Procedure: ENDOSCOPIC RETROGRADE CHOLANGIOPANCREATOGRAPHY (ERCP) WITH PROPOFOL;  Surgeon: Irving Copas., MD;  Location: [place];  Service:  Gastroenterology;  Laterality: N/A;  . ENDOSCOPIC RETROGRADE CHOLANGIOPANCREATOGRAPHY (ERCP) WITH PROPOFOL N/A 11/30/2018   Procedure: ENDOSCOPIC RETROGRADE CHOLANGIOPANCREATOGRAPHY (ERCP) WITH PROPOFOL;  Surgeon: Rush Landmark Telford Nab., MD;  Location: [place];  Service: Gastroenterology;  Laterality: N/A;  . ERCP N/A 07/23/2018   Procedure: ENDOSCOPIC RETROGRADE CHOLANGIOPANCREATOGRAPHY (ERCP);  Surgeon: Irving Copas., MD;  Location: [place];  Service: Gastroenterology;  Laterality: N/A;  . ERCP N/A 08/27/2018   Procedure: ENDOSCOPIC RETROGRADE CHOLANGIOPANCREATOGRAPHY (ERCP);  Surgeon: Irving Copas., MD;  Location: [place];  Service: Gastroenterology;  Laterality: N/A;  . ESOPHAGOGASTRODUODENOSCOPY (EGD) WITH PROPOFOL N/A 08/27/2018   Procedure: ESOPHAGOGASTRODUODENOSCOPY (EGD) WITH PROPOFOL;  Surgeon: Rush Landmark Telford Nab., MD;  Location: [place];  Service: Gastroenterology;  Laterality: N/A;  . EUS N/A 08/27/2018   Procedure: ESOPHAGEAL ENDOSCOPIC ULTRASOUND (EUS) RADIAL;  Surgeon: Rush Landmark Telford Nab., MD;  Location: [place];  Service: Gastroenterology;  Laterality: N/A;  . FINE NEEDLE ASPIRATION  08/27/2018   Procedure: FINE NEEDLE ASPIRATION (FNA) LINEAR;  Surgeon: Irving Copas., MD;  Location: [place];  Service: Gastroenterology;;  . IR IMAGING GUIDED PORT INSERTION  01/08/2018  . LAPAROSCOPIC NEPHRECTOMY Left 08/01/2016   Procedure: LAPAROSCOPIC  RADICAL NEPHRECTOMY/ REPAIR OF UMBILICAL HERNIA;  Surgeon: Raynelle Bring, MD;  Location: [facility];  Service: Urology;  Laterality: Left;  . REMOVAL OF STONES  07/23/2018   Procedure: REMOVAL OF GALL STONES;  Surgeon: Rush Landmark Telford Nab., MD;  Location: [place];  Service: Gastroenterology;;  . REMOVAL OF STONES  08/10/2018   Procedure: REMOVAL OF STONES;  Surgeon: Irving Copas., MD;  Location: [place];  Service: Gastroenterology;;  . REMOVAL OF STONES  08/27/2018   Procedure:  REMOVAL OF STONES;  Surgeon: Irving Copas., MD;  Location: [place];  Service: Gastroenterology;;  . [name]  07/23/2018   Procedure: [name];  Surgeon: Irving Copas., MD;  Location: [place];  Service: Gastroenterology;;  . Lavell Islam REMOVAL  08/27/2018   Procedure: STENT REMOVAL;  Surgeon: Irving Copas., MD;  Location: [place];  Service: Gastroenterology;;  . Lavell Islam REMOVAL  11/30/2018   Procedure: STENT REMOVAL;  Surgeon: Irving Copas., MD;  Location: [place];  Service: Gastroenterology;;  . UPPER GI ENDOSCOPY     x 1   No reported past surgeries EGD 05/01/2016 Dr. Earley Brooke Colonoscopy 03/2016 Dr. Earley Brooke  SOCIAL HISTORY: Social History   Socioeconomic History  . Marital status: Married    Spouse name: Not on file  . Number of children: 3  . Years of education: Not on file  . Highest education level: Not on file  Occupational History  . Occupation: retired  Scientific laboratory technician  . Financial resource strain: Not on file  . Food insecurity    Worry: Not on file    Inability: Not on file  . Transportation needs    Medical: Not on file    Non-medical: Not on file  Tobacco Use  . Smoking status: Former Smoker    Packs/day: 1.00    Years: 8.00    Pack years: 8.00    Types: Cigarettes    Quit date: 06/18/1964    Years since quitting: 54.6  . Smokeless tobacco: Never  Used  Substance and Sexual Activity  . Alcohol use: No  . Drug use: No  . Sexual activity: Not on file  Lifestyle  . Physical activity    Days per week: Not on file    Minutes per session: Not on file  . Stress: Not on file  Relationships  . Social Herbalist on phone: Not on file    Gets together: Not on file    Attends religious service: Not on file    Active member of club or organization: Not on file    Attends meetings of clubs or organizations: Not on file    Relationship status: Not on file  . Intimate partner violence    Fear of  current or ex partner: Not on file    Emotionally abused: Not on file    Physically abused: Not on file    Forced sexual activity: Not on file  Other Topics Concern  . Not on file  Social History Narrative   Married.  Three children   Patient lives in [place] Works as a Solicitor part-time Ex-smoker quit long time ago In 1961 . Smoked 2 packs per day for 8 years prior to that .  or a lot of stress since her daughter is also getting treated for stage IV uterine cancer.  FAMILY HISTORY:  Mother deceased Father with asthma and emphysema died at 36 years with the MI. Brother deceased of heart disease   ALLERGIES:  is allergic to adhesive [tape]; gabapentin; nsaids; and statins.  MEDICATIONS:  Current Outpatient Medications  Medication Sig Dispense Refill  . amLODipine (NORVASC) 5 MG tablet Take 5 mg by mouth daily.   6  . atorvastatin (LIPITOR) 40 MG tablet Take 40 mg by mouth at bedtime.     . carvedilol (COREG) 6.25 MG tablet Take 6.25 mg by mouth 2 (two) times daily.  6  . clobetasol ointment (TEMOVATE) [id number] % Apply 1 application topically 2 (two) times daily as needed (lichen sclerosis).     . Denosumab (XGEVA [place]) Inject into the skin every 6 (six) weeks.    . hydrocortisone (CORTEF) 10 MG tablet Take 5-10 mg by mouth See admin instructions. Take 1 tablet (10 mg) by mouth in the morning & 1/2 tablet (5 mg) by mouth in the evening.    Marland Kitchen levothyroxine (SYNTHROID) 75 MCG tablet Take 75 mcg by mouth daily before breakfast.     . lidocaine (LIDODERM) 5 % Place 1 patch onto the skin daily. Remove & Discard patch within 12 hours or as directed by MD Using on Right Hip 30 patch 0  . lidocaine-prilocaine (EMLA) cream Apply to affected area once (Patient taking differently: Apply 1 application topically daily as needed (prior to port access). ) 30 g 3  . Nivolumab (OPDIVO IV) Inject 1 Dose into the vein every 14 (fourteen) days. [place]    . omeprazole (PRILOSEC)  20 MG capsule Take 1 capsule by mouth twice daily 60 capsule 0  . repaglinide (PRANDIN) 0.5 MG tablet Take 0.5 mg by mouth 2 (two) times daily before a meal.     No current facility-administered medications for this visit.    Facility-Administered Medications Ordered in Other Visits  Medication Dose Route Frequency Provider Last Rate Last Dose  . sodium chloride flush (NS) 0.9 % injection 10 mL  10 mL Intracatheter PRN Truitt Merle, MD   10 mL at 11/20/18 1232    REVIEW OF SYSTEMS:  A 10+ POINT REVIEW OF SYSTEMS WAS OBTAINED including neurology, dermatology, psychiatry, cardiac, respiratory, lymph, extremities, GI, GU, Musculoskeletal, constitutional, breasts, reproductive, HEENT.  All pertinent positives are noted in the HPI.  All others are negative.      PHYSICAL EXAMINATION:  ECOG FS:1 - Symptomatic but completely ambulatory  There were no vitals filed for this visit. Wt Readings from Last 3 Encounters:  01/15/19 129 lb 6.4 oz (58.7 kg)  12/18/18 125 lb 9.6 oz (57 kg)  12/04/18 124 lb 12 oz (56.6 kg)   There is no height or weight on file to calculate BMI.    GENERAL:alert, in no acute distress and comfortable SKIN: no acute rashes, no significant lesions EYES: conjunctiva are pink and non-injected, sclera anicteric OROPHARYNX: MMM, no exudates, no oropharyngeal erythema or ulceration NECK: supple, no JVD LYMPH:  no palpable lymphadenopathy in the cervical, axillary or inguinal regions LUNGS: clear to auscultation b/l with normal respiratory effort HEART: regular rate & rhythm ABDOMEN:  normoactive bowel sounds , non tender, not distended. Extremity: no pedal edema PSYCH: alert & oriented x 3 with fluent speech NEURO: no focal motor/sensory deficits      LABORATORY DATA:  I have reviewed the data as listed  . CBC Latest Ref Rng & Units 01/29/2019 01/15/2019 01/01/2019  WBC 4.0 - 10.5 K/uL 7.8 8.7 9.2  Hemoglobin 12.0 - 15.0 g/dL 11.3(L) 11.1(L) 11.1(L)  Hematocrit 36.0  - 46.0 % 35.5(L) 35.3(L) 35.0(L)  Platelets 150 - 400 K/uL 262 266 292    CBC    Component Value Date/Time   WBC 7.8 01/29/2019 0845   RBC 3.96 01/29/2019 0845   HGB 11.3 (L) 01/29/2019 0845   HGB 11.4 (L) 09/11/2018 0900   HGB 11.2 (L) 04/04/2017 0830   HCT 35.5 (L) 01/29/2019 0845   HCT 35.2 04/04/2017 0830   PLT 262 01/29/2019 0845   PLT 277 09/11/2018 0900   PLT 250 04/04/2017 0830   MCV 89.6 01/29/2019 0845   MCV 107.6 (H) 04/04/2017 0830   MCH 28.5 01/29/2019 0845   MCHC 31.8 01/29/2019 0845   RDW 14.7 01/29/2019 0845   RDW 14.0 04/04/2017 0830   LYMPHSABS 1.3 01/29/2019 0845   LYMPHSABS 1.6 04/04/2017 0830   MONOABS 0.8 01/29/2019 0845   MONOABS 0.4 04/04/2017 0830   EOSABS 0.1 01/29/2019 0845   EOSABS 0.1 04/04/2017 0830   BASOSABS 0.1 01/29/2019 0845   BASOSABS 0.0 04/04/2017 0830     . CMP Latest Ref Rng & Units 01/29/2019 01/15/2019 01/01/2019  Glucose 70 - 99 mg/dL 143(H) 123(H) 120(H)  BUN 8 - 23 mg/dL 26(H) 26(H) 24(H)  Creatinine 0.44 - 1.00 mg/dL 1.47(H) 1.54(H) 1.41(H)  Sodium 135 - 145 mmol/L 137 137 132(L)  Potassium 3.5 - 5.1 mmol/L 4.5 5.3(H) 5.3(H)  Chloride 98 - 111 mmol/L 105 104 101  CO2 22 - 32 mmol/L 20(L) 25 23  Calcium 8.9 - 10.3 mg/dL 8.9 9.6 8.8(L)  Total Protein 6.5 - 8.1 g/dL 6.8 6.6 6.8  Total Bilirubin 0.3 - 1.2 mg/dL 0.5 0.4 0.4  Alkaline Phos 38 - 126 U/L 94 132(H) 169(H)  AST 15 - 41 U/L 17 20 31   ALT 0 - 44 U/L 20 23 34   B12 level 299 (OSH)-->455  . Lab Results  Component Value Date   LDH 136 03/13/2018        08/27/18 Surgical Pathology from Cholecystectomy:         RADIOGRAPHIC STUDIES: I have personally reviewed the radiological images  as listed and agreed with the findings in the report. Nm Pet Image Restag (ps) Skull Base To Thigh  Result Date: 01/03/2017 IMPRESSION: 1. There is a new lesion in the hepatic dome which is FDG avid and low in attenuation on CT imaging consistent with metastatic disease.  Another region of uptake in the left hepatic lobe posteriorly demonstrates no CT correlate. A second subtle metastasis is not excluded on today's study. An MRI could better assess for other hepatic metastases if clinically warranted. 2. New metastatic lesion in the left side of T8. 3. New pulmonary nodule in the right upper lobe. This is too small to characterize but suspicious. Recommend attention on follow-up. No FDG avid nodules or other enlarging nodules. 4. The uptake at the previous left renal artery has almost resolved in the interval and is favored to be post therapeutic. Recommend attention on follow-up. 5. The metastasis in the posterior right hilum seen previously has resolved. Electronically Signed   By: Dorise Bullion III M.D   On: 01/03/2017 11:16   .No results found.  ASSESSMENT & PLAN:    78 y.o. 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 PET/CT 06/19/2017 -- consistent with partial metabolic response to treatment.  Rt adrenal gland bx - showed clear cell RCC  S/p CYtoreductive left radical nephrectomy and left adrenal gland resection on 08/01/2016 by Dr Alinda Money.  10/16/17 PET/CT revealed Continued improvement, with the T7 metastatic lesion no longer significantly hypermetabolic. The previous right lower lobe pulmonary nodule is even less apparent, perhaps about 2 mm in diameter today; given that this measured 6 mm on 03/28/2017 this probably represents an effectively treated metastatic lesion. Currently no appreciable hypermetabolic activity is identified to suggest active malignancy. Distended gallbladder with gallbladder wall thickening and gallstones. Correlate clinically in assessing for cholecystitis. Small but abnormal amount of free pelvic fluid, nonspecific. Other imaging findings of potential clinical significance: Chronic ethmoid sinusitis. Aortic  Atherosclerosis. Stable 5 mm right middle lobe pulmonary nodule, not hypermetabolic but below sensitive PET-CT size thresholds. Left nephrectomy. Notable pelvic floor laxity with cystocele. Chronic bilateral Sacroiliitis.  04/03/18 PET/CT revealed No evidence for new or progressive hypermetabolic disease on today's study to suggest metastatic progression. 2. Stable appearance of the T7 metastatic lesion without Hypermetabolism. 3. Tiny focus of FDG accumulation identified along the skin of the low right inguinal fold. No associated lesion evident on CT. This may be related to urinary contaminant. 4. Cholelithiasis with similar appearance of diffuse gallbladder wall thickening. 5. Stable 5 mm right middle lobe pulmonary nodule. 6.  Aortic Atherosclerois. 7. Diffuse colonic diverticulosis.   09/18/18 CT A/P revealed "Common duct stent in place. Improvement to resolution in biliary duct dilatation compared to 08/29/2018. Interval cholecystectomy without acute complication. 2. Left nephrectomy, without evidence of metastatic disease. 3.  Tiny hiatal hernia. 4. Coronary artery atherosclerosis. Aortic Atherosclerosis. 5. Mild limitations secondary to lack of IV contrast. 6. Marked pelvic floor laxity with cystocele and rectal prolapse".  #2 b/l adrenal metastases from [place] s/p left adrenalectomy with adrenal insuff - follows with Dr Buddy Duty.  #3 Small pulmonary lesions -- 10/16/17 PET/CT showed pulmonary less apparent than 03/28/17 PET/CT, decreasing from 32mm to 61mm diameter.   MRI brain shows no evidence of metastatic disease  #4 T7/8 Bone metastases - received Xgeva every 4 weeks from May 2018 to June 2019. 10/16/17 PET/CT revealed improvement with T7 metastatic lesion  no longer significantly hypermetabolic.  -on Marchelle Folks  #5 ?liver mets- rpt PET/CT from 10/16/2017 shows no overt evidence of metastatic disease in the liver.  #6 Grade 1 Nausea - improved and intermittent. Hasnt used her anti-emetic as  instructed so some nausea and decreased po intake.  #7 Grade 1 Diarrhea - resolved  #8 Hyponatremia -  resolved with sodium at 136 - likely related to some element of adrenal insufficiency, diarrhea, limited by mouth intake. Primarily solute free fluid intake.   #9 Acute on chronic renal insufficiency Creatine stable 1.35  #10 Hyperkalemia due to ACEI + RF- resolved  #11 Hemorrhoids -chronic with some bleeding --Recommended Sitz bath and OTC Anusol or Nupercaine for her hemorrhoid relief. -f/u with PCP for continued mx   #12 Moderate protein calorie malnutrition Weight has stabilized and improved Wt Readings from Last 3 Encounters:  01/15/19 129 lb 6.4 oz (58.7 kg)  12/18/18 125 lb 9.6 oz (57 kg)  12/04/18 124 lb 12 oz (56.6 kg)   Plan: Continue healthy po intake/diabetic diet -Previously recommended the patient to drink atleast 48-64 oz of fluids daily -f/u with PCP/Cardiology for diuretics management  #13 Hypothyroidism/Adrenal insufficiency/Diabetes -Continue being followed by Dr. Buddy Duty  #14 HTN - ?control. Patient tends to be anxious and has higher blood pressures in the clinic. She can have increased blood pressure from [place] as well. Plan: -continued close f/u with her PCP /cardiology regarding the many elements necessary for her care that are not directly related to her oncology care. -ACEI held due to AKI and hyperkalemia - following with cardiology to mx this. Has been started on Amlodipine instead.  #15 Grade 1 mucositis- resolved -Advised the patient to continue use of Magic Mouthwash to aid with nutrition  #16 Newly diagnosed ?CAD - positive cardiac nuclear stress test Following with cardiology and nephrology ? Need for pre-operative cardiac cath  #17 Choledocholithiasis and cholelithiasis 07/10/18 US Abdomen revealed Biliary duct dilatation with 8 mm calculus at the level of the ampulla in the distal common bile duct. 2. Cholelithiasis. No gallbladder wall  thickening or pericholecystic fluid. 3. Appearance of the liver raises concern for underlying hepatic cirrhosis. No focal liver lesions are demonstrable. 4.  Left kidney absent. 5.  Small cysts in right kidney. 6.  Aortic Atherosclerosis.  S/p ERCP on 07/23/18 with Dr. Valarie Merino Mansouraty  09/02/18 Cytopathology from ERCP was suspicious for malignant cells in bile duct, but was not definitive, other two findings were benign.  01/04/2019 PET scan with results revealing "No findings suspicious for recurrent or progressive metastatic renal cell carcinoma. Stable lytic and sclerotic lesions in the thoracic spine. Low-level hypermetabolic activity in the porta hepatis along the superior aspect of the patient's recently exchanged biliary stent, likely reactive. No discrete adenopathy seen in this area. Stable incidental findings including extensive coronary and aortic Atherosclerosis (ICD10-I70.0). Rectocele and cystocele noted."  PLAN:  A&P: -Discussed pt labwork today, 02/12/19; CBC w/diff and CMP is as follows: all values are WNL except for Hemoglobin at 11.8, Abs Immature Granulocytes at 0.09, Sodium at 134, Glucose Bld at 118, BUN at 29, Creatinine at 1.62, AST at 60, ALT at 125, GFR, Est Non Af Am 31, GFR, Est Af Am at 35. -Discussed that Blood counts are improving -Discussed the pros and cons of get heart cath and suggested having detailed discussion with her cardiologist. -Recommended getting a scan of her left shoulder for the pain and weakness in her left arm- with her PCP -Elevation in transaminases 2-3  X ULN noted. Has been self limiting in the past and will continue to monitor with continued immunotherapy.  FOLLOW UP: F/u for next 2 treatments as scheduled   The total time spent in the appt was 25 minutes and more than 50% was on counseling and direct patient cares.  All of the patient's questions were answered with apparent satisfaction. The patient knows to call the clinic with any  problems, questions or concerns.     Sullivan Lone MD MS AAHIVMS [facility] Hematology/Oncology Physician [facility]  (Office):       [phone] (Work cell):  [phone] (Fax):           [phone]   I, Scot Dock, am acting as a scribe for Dr. Sullivan Lone.   .I have reviewed the above documentation for accuracy and completeness, and I agree with the above. Brunetta Genera MD

## 2019-02-12 ENCOUNTER — Telehealth: Payer: Self-pay | Admitting: Hematology

## 2019-02-12 ENCOUNTER — Telehealth: Payer: Self-pay

## 2019-02-12 ENCOUNTER — Inpatient Hospital Stay: Payer: Medicare Other

## 2019-02-12 ENCOUNTER — Inpatient Hospital Stay (HOSPITAL_BASED_OUTPATIENT_CLINIC_OR_DEPARTMENT_OTHER): Payer: Medicare Other | Admitting: Hematology

## 2019-02-12 ENCOUNTER — Other Ambulatory Visit: Payer: Self-pay

## 2019-02-12 VITALS — BP 143/73 | HR 67 | Temp 98.2°F | Resp 17 | Ht 59.0 in | Wt 129.6 lb

## 2019-02-12 DIAGNOSIS — R945 Abnormal results of liver function studies: Secondary | ICD-10-CM | POA: Diagnosis not present

## 2019-02-12 DIAGNOSIS — I2510 Atherosclerotic heart disease of native coronary artery without angina pectoris: Secondary | ICD-10-CM | POA: Diagnosis not present

## 2019-02-12 DIAGNOSIS — Z95828 Presence of other vascular implants and grafts: Secondary | ICD-10-CM

## 2019-02-12 DIAGNOSIS — R7989 Other specified abnormal findings of blood chemistry: Secondary | ICD-10-CM

## 2019-02-12 DIAGNOSIS — C7951 Secondary malignant neoplasm of bone: Secondary | ICD-10-CM

## 2019-02-12 DIAGNOSIS — C642 Malignant neoplasm of left kidney, except renal pelvis: Secondary | ICD-10-CM

## 2019-02-12 DIAGNOSIS — Z7189 Other specified counseling: Secondary | ICD-10-CM

## 2019-02-12 DIAGNOSIS — Z5112 Encounter for antineoplastic immunotherapy: Secondary | ICD-10-CM | POA: Diagnosis not present

## 2019-02-12 LAB — CBC WITH DIFFERENTIAL/PLATELET
Abs Immature Granulocytes: 0.09 10*3/uL — ABNORMAL HIGH (ref 0.00–0.07)
Basophils Absolute: 0.1 10*3/uL (ref 0.0–0.1)
Basophils Relative: 1 %
Eosinophils Absolute: 0.2 10*3/uL (ref 0.0–0.5)
Eosinophils Relative: 2 %
HCT: 37.2 % (ref 36.0–46.0)
Hemoglobin: 11.8 g/dL — ABNORMAL LOW (ref 12.0–15.0)
Immature Granulocytes: 1 %
Lymphocytes Relative: 18 %
Lymphs Abs: 1.4 10*3/uL (ref 0.7–4.0)
MCH: 28.4 pg (ref 26.0–34.0)
MCHC: 31.7 g/dL (ref 30.0–36.0)
MCV: 89.6 fL (ref 80.0–100.0)
Monocytes Absolute: 1 10*3/uL (ref 0.1–1.0)
Monocytes Relative: 13 %
Neutro Abs: 5.1 10*3/uL (ref 1.7–7.7)
Neutrophils Relative %: 65 %
Platelets: 254 10*3/uL (ref 150–400)
RBC: 4.15 MIL/uL (ref 3.87–5.11)
RDW: 14.7 % (ref 11.5–15.5)
WBC: 7.7 10*3/uL (ref 4.0–10.5)
nRBC: 0 % (ref 0.0–0.2)

## 2019-02-12 LAB — CMP (CANCER CENTER ONLY)
ALT: 125 U/L — ABNORMAL HIGH (ref 0–44)
AST: 60 U/L — ABNORMAL HIGH (ref 15–41)
Albumin: 3.6 g/dL (ref 3.5–5.0)
Alkaline Phosphatase: 97 U/L (ref 38–126)
Anion gap: 10 (ref 5–15)
BUN: 29 mg/dL — ABNORMAL HIGH (ref 8–23)
CO2: 22 mmol/L (ref 22–32)
Calcium: 9.3 mg/dL (ref 8.9–10.3)
Chloride: 102 mmol/L (ref 98–111)
Creatinine: 1.62 mg/dL — ABNORMAL HIGH (ref 0.44–1.00)
GFR, Est AFR Am: 35 mL/min — ABNORMAL LOW (ref >60)
GFR, Estimated: 31 mL/min — ABNORMAL LOW (ref >60)
Glucose, Bld: 118 mg/dL — ABNORMAL HIGH (ref 70–99)
Potassium: 4.7 mmol/L (ref 3.5–5.1)
Sodium: 134 mmol/L — ABNORMAL LOW (ref 135–145)
Total Bilirubin: 0.4 mg/dL (ref 0.3–1.2)
Total Protein: 6.7 g/dL (ref 6.5–8.1)

## 2019-02-12 MED ORDER — DENOSUMAB 120 MG/1.7ML ~~LOC~~ SOLN
SUBCUTANEOUS | Status: AC
  Filled 2019-02-12: qty 1.7

## 2019-02-12 MED ORDER — SODIUM CHLORIDE 0.9 % IV SOLN
240.0000 mg | Freq: Once | INTRAVENOUS | Status: AC
  Administered 2019-02-12: 240 mg via INTRAVENOUS
  Filled 2019-02-12: qty 24

## 2019-02-12 MED ORDER — SODIUM CHLORIDE 0.9 % IV SOLN
Freq: Once | INTRAVENOUS | Status: AC
  Administered 2019-02-12: 10:00:00 via INTRAVENOUS
  Filled 2019-02-12: qty 250

## 2019-02-12 MED ORDER — SODIUM CHLORIDE 0.9% FLUSH
10.0000 mL | INTRAVENOUS | Status: DC | PRN
  Administered 2019-02-12: 12:00:00 10 mL
  Filled 2019-02-12: qty 10

## 2019-02-12 MED ORDER — SODIUM CHLORIDE 0.9% FLUSH
10.0000 mL | Freq: Once | INTRAVENOUS | Status: AC
  Administered 2019-02-12: 10 mL
  Filled 2019-02-12: qty 10

## 2019-02-12 MED ORDER — HEPARIN SOD (PORK) LOCK FLUSH 100 UNIT/ML IV SOLN
500.0000 [IU] | Freq: Once | INTRAVENOUS | Status: AC | PRN
  Administered 2019-02-12: 500 [IU]
  Filled 2019-02-12: qty 5

## 2019-02-12 MED ORDER — DENOSUMAB 120 MG/1.7ML ~~LOC~~ SOLN
120.0000 mg | Freq: Once | SUBCUTANEOUS | Status: AC
  Administered 2019-02-12: 120 mg via SUBCUTANEOUS

## 2019-02-12 NOTE — Telephone Encounter (Signed)
Per 10/23 los F/u for next 2 treatments as scheduled

## 2019-02-12 NOTE — Patient Instructions (Signed)
Nivolumab injection What is this medicine? NIVOLUMAB (nye VOL ue mab) is a monoclonal antibody. It is used to treat melanoma, lung cancer, kidney cancer, head and neck cancer, Hodgkin lymphoma, urothelial cancer, colon cancer, and liver cancer. This medicine may be used for other purposes; ask your health care provider or pharmacist if you have questions. COMMON BRAND NAME(S): Opdivo What should I tell my health care provider before I take this medicine? They need to know if you have any of these conditions:  diabetes  immune system problems  kidney disease  liver disease  lung disease  organ transplant  stomach or intestine problems  thyroid disease  an unusual or allergic reaction to nivolumab, other medicines, foods, dyes, or preservatives  pregnant or trying to get pregnant  breast-feeding How should I use this medicine? This medicine is for infusion into a vein. It is given by a health care professional in a hospital or clinic setting. A special MedGuide will be given to you before each treatment. Be sure to read this information carefully each time. Talk to your pediatrician regarding the use of this medicine in children. While this drug may be prescribed for children as young as 12 years for selected conditions, precautions do apply. Overdosage: If you think you have taken too much of this medicine contact a poison control center or emergency room at once. NOTE: This medicine is only for you. Do not share this medicine with others. What if I miss a dose? It is important not to miss your dose. Call your doctor or health care professional if you are unable to keep an appointment. What may interact with this medicine? Interactions have not been studied. Give your health care provider a list of all the medicines, herbs, non-prescription drugs, or dietary supplements you use. Also tell them if you smoke, drink alcohol, or use illegal drugs. Some items may interact with your  medicine. This list may not describe all possible interactions. Give your health care provider a list of all the medicines, herbs, non-prescription drugs, or dietary supplements you use. Also tell them if you smoke, drink alcohol, or use illegal drugs. Some items may interact with your medicine. What should I watch for while using this medicine? This drug may make you feel generally unwell. Continue your course of treatment even though you feel ill unless your doctor tells you to stop. You may need blood work done while you are taking this medicine. Do not become pregnant while taking this medicine or for 5 months after stopping it. Women should inform their doctor if they wish to become pregnant or think they might be pregnant. There is a potential for serious side effects to an unborn child. Talk to your health care professional or pharmacist for more information. Do not breast-feed an infant while taking this medicine or for 5 months after stopping it. What side effects may I notice from receiving this medicine? Side effects that you should report to your doctor or health care professional as soon as possible:  allergic reactions like skin rash, itching or hives, swelling of the face, lips, or tongue  breathing problems  blood in the urine  bloody or watery diarrhea or black, tarry stools  changes in emotions or moods  changes in vision  chest pain  cough  dizziness  feeling faint or lightheaded, falls  fever, chills  headache with fever, neck stiffness, confusion, loss of memory, sensitivity to light, hallucination, loss of contact with reality, or seizures  joint   pain  mouth sores  redness, blistering, peeling or loosening of the skin, including inside the mouth  severe muscle pain or weakness  signs and symptoms of high blood sugar such as dizziness; dry mouth; dry skin; fruity breath; nausea; stomach pain; increased hunger or thirst; increased urination  signs and  symptoms of kidney injury like trouble passing urine or change in the amount of urine  signs and symptoms of liver injury like dark yellow or brown urine; general ill feeling or flu-like symptoms; light-colored stools; loss of appetite; nausea; right upper belly pain; unusually weak or tired; yellowing of the eyes or skin  swelling of the ankles, feet, hands  trouble passing urine or change in the amount of urine  unusually weak or tired  weight gain or loss Side effects that usually do not require medical attention (report to your doctor or health care professional if they continue or are bothersome):  bone pain  constipation  decreased appetite  diarrhea  muscle pain  nausea, vomiting  tiredness This list may not describe all possible side effects. Call your doctor for medical advice about side effects. You may report side effects to FDA at 1-800-FDA-1088. Where should I keep my medicine? This drug is given in a hospital or clinic and will not be stored at home. NOTE: This sheet is a summary. It may not cover all possible information. If you have questions about this medicine, talk to your doctor, pharmacist, or health care provider.  2020 Elsevier/Gold Standard (2017-08-27 12:55:04)  Denosumab injection What is this medicine? DENOSUMAB (den oh sue mab) slows bone breakdown. Prolia is used to treat osteoporosis in women after menopause and in men, and in people who are taking corticosteroids for 6 months or more. Delton See is used to treat a high calcium level due to cancer and to prevent bone fractures and other bone problems caused by multiple myeloma or cancer bone metastases. Delton See is also used to treat giant cell tumor of the bone. This medicine may be used for other purposes; ask your health care provider or pharmacist if you have questions. COMMON BRAND NAME(S): Prolia, XGEVA What should I tell my health care provider before I take this medicine? They need to know if you  have any of these conditions:  dental disease  having surgery or tooth extraction  infection  kidney disease  low levels of calcium or Vitamin D in the blood  malnutrition  on hemodialysis  skin conditions or sensitivity  thyroid or parathyroid disease  an unusual reaction to denosumab, other medicines, foods, dyes, or preservatives  pregnant or trying to get pregnant  breast-feeding How should I use this medicine? This medicine is for injection under the skin. It is given by a health care professional in a hospital or clinic setting. A special MedGuide will be given to you before each treatment. Be sure to read this information carefully each time. For Prolia, talk to your pediatrician regarding the use of this medicine in children. Special care may be needed. For Delton See, talk to your pediatrician regarding the use of this medicine in children. While this drug may be prescribed for children as young as 13 years for selected conditions, precautions do apply. Overdosage: If you think you have taken too much of this medicine contact a poison control center or emergency room at once. NOTE: This medicine is only for you. Do not share this medicine with others. What if I miss a dose? It is important not to miss your  dose. Call your doctor or health care professional if you are unable to keep an appointment. What may interact with this medicine? Do not take this medicine with any of the following medications:  other medicines containing denosumab This medicine may also interact with the following medications:  medicines that lower your chance of fighting infection  steroid medicines like prednisone or cortisone This list may not describe all possible interactions. Give your health care provider a list of all the medicines, herbs, non-prescription drugs, or dietary supplements you use. Also tell them if you smoke, drink alcohol, or use illegal drugs. Some items may interact with  your medicine. What should I watch for while using this medicine? Visit your doctor or health care professional for regular checks on your progress. Your doctor or health care professional may order blood tests and other tests to see how you are doing. Call your doctor or health care professional for advice if you get a fever, chills or sore throat, or other symptoms of a cold or flu. Do not treat yourself. This drug may decrease your body's ability to fight infection. Try to avoid being around people who are sick. You should make sure you get enough calcium and vitamin D while you are taking this medicine, unless your doctor tells you not to. Discuss the foods you eat and the vitamins you take with your health care professional. See your dentist regularly. Brush and floss your teeth as directed. Before you have any dental work done, tell your dentist you are receiving this medicine. Do not become pregnant while taking this medicine or for 5 months after stopping it. Talk with your doctor or health care professional about your birth control options while taking this medicine. Women should inform their doctor if they wish to become pregnant or think they might be pregnant. There is a potential for serious side effects to an unborn child. Talk to your health care professional or pharmacist for more information. What side effects may I notice from receiving this medicine? Side effects that you should report to your doctor or health care professional as soon as possible:  allergic reactions like skin rash, itching or hives, swelling of the face, lips, or tongue  bone pain  breathing problems  dizziness  jaw pain, especially after dental work  redness, blistering, peeling of the skin  signs and symptoms of infection like fever or chills; cough; sore throat; pain or trouble passing urine  signs of low calcium like fast heartbeat, muscle cramps or muscle pain; pain, tingling, numbness in the hands  or feet; seizures  unusual bleeding or bruising  unusually weak or tired Side effects that usually do not require medical attention (report to your doctor or health care professional if they continue or are bothersome):  constipation  diarrhea  headache  joint pain  loss of appetite  muscle pain  runny nose  tiredness  upset stomach This list may not describe all possible side effects. Call your doctor for medical advice about side effects. You may report side effects to FDA at 1-800-FDA-1088. Where should I keep my medicine? This medicine is only given in a clinic, doctor's office, or other health care setting and will not be stored at home. NOTE: This sheet is a summary. It may not cover all possible information. If you have questions about this medicine, talk to your doctor, pharmacist, or health care provider.  2020 Elsevier/Gold Standard (2017-08-15 16:10:44)  Coronavirus (COVID-19) Are you at risk?  Are you  at risk for the Coronavirus (COVID-19)?  To be considered HIGH RISK for Coronavirus (COVID-19), you have to meet the following criteria:  . Traveled to [place], [place], [place], [place] or [place]; or in the [place] to [place], [place], [place], or [place]; and have fever, cough, and shortness of breath within the last 2 weeks of travel OR . Been in close contact with a person diagnosed with COVID-19 within the last 2 weeks and have fever, cough, and shortness of breath . IF YOU DO NOT MEET THESE CRITERIA, YOU ARE CONSIDERED LOW RISK FOR COVID-19.  What to do if you are HIGH RISK for COVID-19?  Marland Kitchen If you are having a medical emergency, call 911. . Seek medical care right away. Before you go to a doctor's office, urgent care or emergency department, call ahead and tell them about your recent travel, contact with someone diagnosed with COVID-19, and your symptoms. You should receive instructions from your physician's office regarding next steps of care.   . When you arrive at healthcare provider, tell the healthcare staff immediately you have returned from visiting [place], [place], [place], [place] or [place]; or traveled in the [place] to [place], [place], [place], or [place]; in the last two weeks or you have been in close contact with a person diagnosed with COVID-19 in the last 2 weeks.   . Tell the health care staff about your symptoms: fever, cough and shortness of breath. . After you have been seen by a medical provider, you will be either: o Tested for (COVID-19) and discharged home on quarantine except to seek medical care if symptoms worsen, and asked to  - Stay home and avoid contact with others until you get your results (4-5 days)  - Avoid travel on public transportation if possible (such as bus, train, or airplane) or o Sent to the Emergency Department by EMS for evaluation, COVID-19 testing, and possible admission depending on your condition and test results.  What to do if you are LOW RISK for COVID-19?  Reduce your risk of any infection by using the same precautions used for avoiding the common cold or flu:  Marland Kitchen Wash your hands often with soap and warm water for at least 20 seconds.  If soap and water are not readily available, use an alcohol-based hand sanitizer with at least 60% alcohol.  . If coughing or sneezing, cover your mouth and nose by coughing or sneezing into the elbow areas of your shirt or coat, into a tissue or into your sleeve (not your hands). . Avoid shaking hands with others and consider head nods or verbal greetings only. . Avoid touching your eyes, nose, or mouth with unwashed hands.  . Avoid close contact with people who are sick. . Avoid places or events with large numbers of people in one location, like concerts or sporting events. . Carefully consider travel plans you have or are making. . If you are planning any travel outside or inside the [name], visit the CDC's Travelers' Health webpage for the  latest health notices. . If you have some symptoms but not all symptoms, continue to monitor at home and seek medical attention if your symptoms worsen. . If you are having a medical emergency, call 911.   [place] / e-Visit: [email or web address]         [facility]: [facility]: [phone]  [facility]: [phone]

## 2019-02-12 NOTE — Telephone Encounter (Signed)
Patient was concerned about why her liver enzymes were trending up. Told her that per [name]: "could be her immunotherapy vs stent sludge --would monitor with treatment and rpt imaging if increasign LFTs on f/u in 2 weeks". Patient verbalized understanding and agreement, and denied any other needs at this time.

## 2019-02-12 NOTE — Progress Notes (Signed)
Per [name] it is ok to treat today with ALT 125 and Creatinine 1.62

## 2019-02-15 ENCOUNTER — Telehealth: Payer: Self-pay | Admitting: Hematology

## 2019-02-15 NOTE — Telephone Encounter (Signed)
[facility] CME 11/6. Follow up cancelled per GK. Lab/infuison only. Confirmed with patient.

## 2019-02-26 ENCOUNTER — Inpatient Hospital Stay: Payer: Medicare Other

## 2019-02-26 ENCOUNTER — Ambulatory Visit: Payer: 59 | Admitting: Hematology

## 2019-02-26 ENCOUNTER — Inpatient Hospital Stay: Payer: Medicare Other | Attending: Hematology

## 2019-02-26 ENCOUNTER — Other Ambulatory Visit: Payer: Self-pay

## 2019-02-26 VITALS — BP 151/73 | HR 56 | Temp 98.0°F | Resp 16

## 2019-02-26 DIAGNOSIS — C7951 Secondary malignant neoplasm of bone: Secondary | ICD-10-CM | POA: Insufficient documentation

## 2019-02-26 DIAGNOSIS — C642 Malignant neoplasm of left kidney, except renal pelvis: Secondary | ICD-10-CM

## 2019-02-26 DIAGNOSIS — Z95828 Presence of other vascular implants and grafts: Secondary | ICD-10-CM

## 2019-02-26 DIAGNOSIS — Z5112 Encounter for antineoplastic immunotherapy: Secondary | ICD-10-CM | POA: Insufficient documentation

## 2019-02-26 DIAGNOSIS — Z7189 Other specified counseling: Secondary | ICD-10-CM

## 2019-02-26 DIAGNOSIS — E860 Dehydration: Secondary | ICD-10-CM

## 2019-02-26 LAB — CMP (CANCER CENTER ONLY)
ALT: 23 U/L (ref 0–44)
AST: 27 U/L (ref 15–41)
Albumin: 3.6 g/dL (ref 3.5–5.0)
Alkaline Phosphatase: 70 U/L (ref 38–126)
Anion gap: 10 (ref 5–15)
BUN: 22 mg/dL (ref 8–23)
CO2: 20 mmol/L — ABNORMAL LOW (ref 22–32)
Calcium: 9.1 mg/dL (ref 8.9–10.3)
Chloride: 104 mmol/L (ref 98–111)
Creatinine: 1.39 mg/dL — ABNORMAL HIGH (ref 0.44–1.00)
GFR, Est AFR Am: 43 mL/min — ABNORMAL LOW (ref >60)
GFR, Estimated: 37 mL/min — ABNORMAL LOW (ref >60)
Glucose, Bld: 119 mg/dL — ABNORMAL HIGH (ref 70–99)
Potassium: 4.6 mmol/L (ref 3.5–5.1)
Sodium: 134 mmol/L — ABNORMAL LOW (ref 135–145)
Total Bilirubin: 0.4 mg/dL (ref 0.3–1.2)
Total Protein: 6.6 g/dL (ref 6.5–8.1)

## 2019-02-26 LAB — CBC WITH DIFFERENTIAL/PLATELET
Abs Immature Granulocytes: 0.1 10*3/uL — ABNORMAL HIGH (ref 0.00–0.07)
Basophils Absolute: 0.1 10*3/uL (ref 0.0–0.1)
Basophils Relative: 1 %
Eosinophils Absolute: 0.1 10*3/uL (ref 0.0–0.5)
Eosinophils Relative: 2 %
HCT: 36.9 % (ref 36.0–46.0)
Hemoglobin: 11.7 g/dL — ABNORMAL LOW (ref 12.0–15.0)
Immature Granulocytes: 1 %
Lymphocytes Relative: 17 %
Lymphs Abs: 1.4 10*3/uL (ref 0.7–4.0)
MCH: 28.5 pg (ref 26.0–34.0)
MCHC: 31.7 g/dL (ref 30.0–36.0)
MCV: 90 fL (ref 80.0–100.0)
Monocytes Absolute: 0.9 10*3/uL (ref 0.1–1.0)
Monocytes Relative: 12 %
Neutro Abs: 5.5 10*3/uL (ref 1.7–7.7)
Neutrophils Relative %: 67 %
Platelets: 264 10*3/uL (ref 150–400)
RBC: 4.1 MIL/uL (ref 3.87–5.11)
RDW: 14.7 % (ref 11.5–15.5)
WBC: 8.1 10*3/uL (ref 4.0–10.5)
nRBC: 0 % (ref 0.0–0.2)

## 2019-02-26 MED ORDER — HEPARIN SOD (PORK) LOCK FLUSH 100 UNIT/ML IV SOLN
500.0000 [IU] | Freq: Once | INTRAVENOUS | Status: AC | PRN
  Administered 2019-02-26: 500 [IU]
  Filled 2019-02-26: qty 5

## 2019-02-26 MED ORDER — SODIUM CHLORIDE 0.9% FLUSH
10.0000 mL | Freq: Once | INTRAVENOUS | Status: AC
  Administered 2019-02-26: 10 mL
  Filled 2019-02-26: qty 10

## 2019-02-26 MED ORDER — SODIUM CHLORIDE 0.9% FLUSH
10.0000 mL | INTRAVENOUS | Status: DC | PRN
  Administered 2019-02-26: 10 mL
  Filled 2019-02-26: qty 10

## 2019-02-26 MED ORDER — SODIUM CHLORIDE 0.9 % IV SOLN
240.0000 mg | Freq: Once | INTRAVENOUS | Status: AC
  Administered 2019-02-26: 240 mg via INTRAVENOUS
  Filled 2019-02-26: qty 24

## 2019-02-26 MED ORDER — SODIUM CHLORIDE 0.9 % IV SOLN
Freq: Once | INTRAVENOUS | Status: AC
  Administered 2019-02-26: 11:00:00 via INTRAVENOUS
  Filled 2019-02-26: qty 250

## 2019-02-26 NOTE — Patient Instructions (Signed)
[name] Discharge Instructions for Patients Receiving immunotherapy.  Today you received the following chemotherapy agents: Nivolumab.  To help prevent nausea and vomiting after your treatment, we encourage you to take your nausea medication as directed.   If you develop nausea and vomiting that is not controlled by your nausea medication, call the clinic.   BELOW ARE SYMPTOMS THAT SHOULD BE REPORTED IMMEDIATELY:  *FEVER GREATER THAN 100.5 F  *CHILLS WITH OR WITHOUT FEVER  NAUSEA AND VOMITING THAT IS NOT CONTROLLED WITH YOUR NAUSEA MEDICATION  *UNUSUAL SHORTNESS OF BREATH  *UNUSUAL BRUISING OR BLEEDING  TENDERNESS IN MOUTH AND THROAT WITH OR WITHOUT PRESENCE OF ULCERS  *URINARY PROBLEMS  *BOWEL PROBLEMS  UNUSUAL RASH Items with * indicate a potential emergency and should be followed up as soon as possible.  Feel free to call the clinic should you have any questions or concerns. The clinic phone number is [phone].  Please show the [facility] at check-in to the Emergency Department and triage nurse.

## 2019-02-26 NOTE — Patient Instructions (Signed)

## 2019-03-08 ENCOUNTER — Other Ambulatory Visit: Payer: Self-pay | Admitting: Hematology

## 2019-03-08 ENCOUNTER — Telehealth: Payer: Self-pay | Admitting: *Deleted

## 2019-03-08 NOTE — Telephone Encounter (Signed)
Patient called - PCP treating UTI with [facility]. Wants to know if ok with the treatment she receives from Dr.Kale.  Contacted patient with [name] answer: Will not impact immunotherapy. He recommends she f/u with PCP as to med effects and side effects. She verbalized understanding.

## 2019-03-09 ENCOUNTER — Telehealth: Payer: Self-pay | Admitting: Hematology

## 2019-03-09 ENCOUNTER — Other Ambulatory Visit: Payer: Self-pay | Admitting: Gastroenterology

## 2019-03-09 ENCOUNTER — Other Ambulatory Visit: Payer: Self-pay | Admitting: Hematology

## 2019-03-09 DIAGNOSIS — C7951 Secondary malignant neoplasm of bone: Secondary | ICD-10-CM

## 2019-03-09 DIAGNOSIS — C642 Malignant neoplasm of left kidney, except renal pelvis: Secondary | ICD-10-CM

## 2019-03-09 NOTE — Telephone Encounter (Signed)
[facility] 11/20. Per [place] 11/20 f/u cancelled and added next 3 cycles for lab/port/fu/tx. Patient will have only lab/port/tx 11/20. Additional cycles added for 12/4, 12/17, and 1/4 due to patient will be out of town the week of 12/28. GK aware.

## 2019-03-11 ENCOUNTER — Telehealth: Payer: Self-pay

## 2019-03-11 ENCOUNTER — Other Ambulatory Visit: Payer: Self-pay

## 2019-03-11 MED ORDER — OMEPRAZOLE 20 MG PO CPDR: 20.0000 mg | DELAYED_RELEASE_CAPSULE | Freq: Every day | ORAL | 0 refills | Status: DC

## 2019-03-11 NOTE — Telephone Encounter (Signed)
My preference would be for her to first go down to once daily dosing. Please update the chart. When I see her next time we can consider stopping altogether. But we should take this one step at a time. Thanks. GM

## 2019-03-11 NOTE — Telephone Encounter (Signed)
Spoke with pt and she is aware, med list updated.

## 2019-03-11 NOTE — Telephone Encounter (Signed)
Pt states she has kidney cancer and she has been taking omeprazole BID. She has read about the possible kidney issues the med can cause and she would like to stop taking this med. States she is not having any heartburn or indigestion and would like to stop it if ok with Dr. Jannifer Rodney. Please advise.

## 2019-03-12 ENCOUNTER — Other Ambulatory Visit: Payer: Self-pay

## 2019-03-12 ENCOUNTER — Inpatient Hospital Stay: Payer: Medicare Other

## 2019-03-12 ENCOUNTER — Inpatient Hospital Stay: Payer: Medicare Other | Admitting: Hematology

## 2019-03-12 VITALS — BP 148/62 | HR 61 | Temp 98.3°F | Resp 18 | Ht 59.0 in | Wt 129.0 lb

## 2019-03-12 DIAGNOSIS — C7951 Secondary malignant neoplasm of bone: Secondary | ICD-10-CM

## 2019-03-12 DIAGNOSIS — C642 Malignant neoplasm of left kidney, except renal pelvis: Secondary | ICD-10-CM

## 2019-03-12 DIAGNOSIS — Z95828 Presence of other vascular implants and grafts: Secondary | ICD-10-CM

## 2019-03-12 DIAGNOSIS — Z5112 Encounter for antineoplastic immunotherapy: Secondary | ICD-10-CM | POA: Diagnosis not present

## 2019-03-12 DIAGNOSIS — Z7189 Other specified counseling: Secondary | ICD-10-CM

## 2019-03-12 LAB — CBC WITH DIFFERENTIAL/PLATELET
Abs Immature Granulocytes: 0.14 10*3/uL — ABNORMAL HIGH (ref 0.00–0.07)
Basophils Absolute: 0.1 10*3/uL (ref 0.0–0.1)
Basophils Relative: 1 %
Eosinophils Absolute: 0.1 10*3/uL (ref 0.0–0.5)
Eosinophils Relative: 2 %
HCT: 34.9 % — ABNORMAL LOW (ref 36.0–46.0)
Hemoglobin: 11.4 g/dL — ABNORMAL LOW (ref 12.0–15.0)
Immature Granulocytes: 2 %
Lymphocytes Relative: 17 %
Lymphs Abs: 1.4 10*3/uL (ref 0.7–4.0)
MCH: 28.7 pg (ref 26.0–34.0)
MCHC: 32.7 g/dL (ref 30.0–36.0)
MCV: 87.9 fL (ref 80.0–100.0)
Monocytes Absolute: 0.9 10*3/uL (ref 0.1–1.0)
Monocytes Relative: 11 %
Neutro Abs: 5.7 10*3/uL (ref 1.7–7.7)
Neutrophils Relative %: 67 %
Platelets: 281 10*3/uL (ref 150–400)
RBC: 3.97 MIL/uL (ref 3.87–5.11)
RDW: 14 % (ref 11.5–15.5)
WBC: 8.4 10*3/uL (ref 4.0–10.5)
nRBC: 0 % (ref 0.0–0.2)

## 2019-03-12 LAB — CMP (CANCER CENTER ONLY)
ALT: 13 U/L (ref 0–44)
AST: 15 U/L (ref 15–41)
Albumin: 3.5 g/dL (ref 3.5–5.0)
Alkaline Phosphatase: 70 U/L (ref 38–126)
Anion gap: 9 (ref 5–15)
BUN: 21 mg/dL (ref 8–23)
CO2: 21 mmol/L — ABNORMAL LOW (ref 22–32)
Calcium: 8.4 mg/dL — ABNORMAL LOW (ref 8.9–10.3)
Chloride: 108 mmol/L (ref 98–111)
Creatinine: 1.49 mg/dL — ABNORMAL HIGH (ref 0.44–1.00)
GFR, Est AFR Am: 39 mL/min — ABNORMAL LOW (ref >60)
GFR, Estimated: 34 mL/min — ABNORMAL LOW (ref >60)
Glucose, Bld: 178 mg/dL — ABNORMAL HIGH (ref 70–99)
Potassium: 4.5 mmol/L (ref 3.5–5.1)
Sodium: 138 mmol/L (ref 135–145)
Total Bilirubin: 0.4 mg/dL (ref 0.3–1.2)
Total Protein: 6.4 g/dL — ABNORMAL LOW (ref 6.5–8.1)

## 2019-03-12 MED ORDER — SODIUM CHLORIDE 0.9% FLUSH
10.0000 mL | Freq: Once | INTRAVENOUS | Status: AC
  Administered 2019-03-12: 10 mL
  Filled 2019-03-12: qty 10

## 2019-03-12 MED ORDER — DENOSUMAB 120 MG/1.7ML ~~LOC~~ SOLN
SUBCUTANEOUS | Status: AC
  Filled 2019-03-12: qty 1.7

## 2019-03-12 MED ORDER — DENOSUMAB 120 MG/1.7ML ~~LOC~~ SOLN
120.0000 mg | Freq: Once | SUBCUTANEOUS | Status: AC
  Administered 2019-03-12: 120 mg via SUBCUTANEOUS

## 2019-03-12 MED ORDER — SODIUM CHLORIDE 0.9 % IV SOLN
Freq: Once | INTRAVENOUS | Status: AC
  Administered 2019-03-12: 11:00:00 via INTRAVENOUS
  Filled 2019-03-12: qty 250

## 2019-03-12 MED ORDER — SODIUM CHLORIDE 0.9 % IV SOLN
240.0000 mg | Freq: Once | INTRAVENOUS | Status: AC
  Administered 2019-03-12: 240 mg via INTRAVENOUS
  Filled 2019-03-12: qty 24

## 2019-03-12 MED ORDER — HEPARIN SOD (PORK) LOCK FLUSH 100 UNIT/ML IV SOLN
500.0000 [IU] | Freq: Once | INTRAVENOUS | Status: AC | PRN
  Administered 2019-03-12: 500 [IU]
  Filled 2019-03-12: qty 5

## 2019-03-12 MED ORDER — SODIUM CHLORIDE 0.9% FLUSH
10.0000 mL | INTRAVENOUS | Status: DC | PRN
  Administered 2019-03-12: 10 mL
  Filled 2019-03-12: qty 10

## 2019-03-12 NOTE — Patient Instructions (Signed)

## 2019-03-12 NOTE — Patient Instructions (Signed)
[facility] Discharge Instructions for Patients Receiving Chemotherapy  Today you received the following chemotherapy agents Opdivo  To help prevent nausea and vomiting after your treatment, we encourage you to take your nausea medication as directed   If you develop nausea and vomiting that is not controlled by your nausea medication, call the clinic.   BELOW ARE SYMPTOMS THAT SHOULD BE REPORTED IMMEDIATELY:  *FEVER GREATER THAN 100.5 F  *CHILLS WITH OR WITHOUT FEVER  NAUSEA AND VOMITING THAT IS NOT CONTROLLED WITH YOUR NAUSEA MEDICATION  *UNUSUAL SHORTNESS OF BREATH  *UNUSUAL BRUISING OR BLEEDING  TENDERNESS IN MOUTH AND THROAT WITH OR WITHOUT PRESENCE OF ULCERS  *URINARY PROBLEMS  *BOWEL PROBLEMS  UNUSUAL RASH Items with * indicate a potential emergency and should be followed up as soon as possible.  Feel free to call the clinic should you have any questions or concerns. The clinic phone number is [phone].  Please show the CHEMO ALERT CARD at check-in to the Emergency Department and triage nurse.   

## 2019-03-16 ENCOUNTER — Telehealth: Payer: Self-pay | Admitting: *Deleted

## 2019-03-16 NOTE — Telephone Encounter (Signed)
Patient called - she is at PCP for treatment of UTI. She wanted to know if there were medications that she could not receive - she thinks it will be Cefaxalin prescribed today - she was unsure of spelling. Advised her to discuss with PCP before prescribing after review of her med list and review again with pharmacist once she is at pharmacy. The pharmacist can review all her medications and determine if there are any interactions with the new antibiotic. She verbalized understanding.

## 2019-03-26 ENCOUNTER — Inpatient Hospital Stay: Payer: Medicare Other

## 2019-03-26 ENCOUNTER — Inpatient Hospital Stay: Payer: Medicare Other | Attending: Hematology | Admitting: Hematology

## 2019-03-26 ENCOUNTER — Other Ambulatory Visit: Payer: Self-pay

## 2019-03-26 ENCOUNTER — Telehealth: Payer: Self-pay | Admitting: Hematology

## 2019-03-26 VITALS — BP 148/71 | HR 70 | Temp 98.2°F | Resp 17 | Ht 59.0 in | Wt 131.5 lb

## 2019-03-26 DIAGNOSIS — K219 Gastro-esophageal reflux disease without esophagitis: Secondary | ICD-10-CM | POA: Insufficient documentation

## 2019-03-26 DIAGNOSIS — C7800 Secondary malignant neoplasm of unspecified lung: Secondary | ICD-10-CM | POA: Diagnosis not present

## 2019-03-26 DIAGNOSIS — K769 Liver disease, unspecified: Secondary | ICD-10-CM | POA: Insufficient documentation

## 2019-03-26 DIAGNOSIS — N289 Disorder of kidney and ureter, unspecified: Secondary | ICD-10-CM | POA: Diagnosis not present

## 2019-03-26 DIAGNOSIS — E440 Moderate protein-calorie malnutrition: Secondary | ICD-10-CM | POA: Diagnosis not present

## 2019-03-26 DIAGNOSIS — C642 Malignant neoplasm of left kidney, except renal pelvis: Secondary | ICD-10-CM | POA: Insufficient documentation

## 2019-03-26 DIAGNOSIS — I2510 Atherosclerotic heart disease of native coronary artery without angina pectoris: Secondary | ICD-10-CM

## 2019-03-26 DIAGNOSIS — Z7189 Other specified counseling: Secondary | ICD-10-CM

## 2019-03-26 DIAGNOSIS — D649 Anemia, unspecified: Secondary | ICD-10-CM | POA: Insufficient documentation

## 2019-03-26 DIAGNOSIS — Z5112 Encounter for antineoplastic immunotherapy: Secondary | ICD-10-CM | POA: Diagnosis present

## 2019-03-26 DIAGNOSIS — C7951 Secondary malignant neoplasm of bone: Secondary | ICD-10-CM

## 2019-03-26 DIAGNOSIS — Z87891 Personal history of nicotine dependence: Secondary | ICD-10-CM | POA: Insufficient documentation

## 2019-03-26 DIAGNOSIS — E785 Hyperlipidemia, unspecified: Secondary | ICD-10-CM | POA: Diagnosis not present

## 2019-03-26 DIAGNOSIS — I10 Essential (primary) hypertension: Secondary | ICD-10-CM | POA: Insufficient documentation

## 2019-03-26 DIAGNOSIS — E538 Deficiency of other specified B group vitamins: Secondary | ICD-10-CM | POA: Diagnosis not present

## 2019-03-26 DIAGNOSIS — Z79899 Other long term (current) drug therapy: Secondary | ICD-10-CM | POA: Diagnosis not present

## 2019-03-26 DIAGNOSIS — E871 Hypo-osmolality and hyponatremia: Secondary | ICD-10-CM | POA: Diagnosis not present

## 2019-03-26 DIAGNOSIS — K649 Unspecified hemorrhoids: Secondary | ICD-10-CM | POA: Insufficient documentation

## 2019-03-26 DIAGNOSIS — E875 Hyperkalemia: Secondary | ICD-10-CM | POA: Diagnosis not present

## 2019-03-26 DIAGNOSIS — I252 Old myocardial infarction: Secondary | ICD-10-CM | POA: Diagnosis not present

## 2019-03-26 DIAGNOSIS — E119 Type 2 diabetes mellitus without complications: Secondary | ICD-10-CM | POA: Diagnosis not present

## 2019-03-26 DIAGNOSIS — Z95828 Presence of other vascular implants and grafts: Secondary | ICD-10-CM

## 2019-03-26 DIAGNOSIS — E039 Hypothyroidism, unspecified: Secondary | ICD-10-CM | POA: Diagnosis not present

## 2019-03-26 LAB — CBC WITH DIFFERENTIAL/PLATELET
Abs Immature Granulocytes: 0.11 10*3/uL — ABNORMAL HIGH (ref 0.00–0.07)
Basophils Absolute: 0.1 10*3/uL (ref 0.0–0.1)
Basophils Relative: 1 %
Eosinophils Absolute: 0.1 10*3/uL (ref 0.0–0.5)
Eosinophils Relative: 1 %
HCT: 38.9 % (ref 36.0–46.0)
Hemoglobin: 12.1 g/dL (ref 12.0–15.0)
Immature Granulocytes: 1 %
Lymphocytes Relative: 17 %
Lymphs Abs: 1.5 10*3/uL (ref 0.7–4.0)
MCH: 28.1 pg (ref 26.0–34.0)
MCHC: 31.1 g/dL (ref 30.0–36.0)
MCV: 90.3 fL (ref 80.0–100.0)
Monocytes Absolute: 0.9 10*3/uL (ref 0.1–1.0)
Monocytes Relative: 10 %
Neutro Abs: 5.9 10*3/uL (ref 1.7–7.7)
Neutrophils Relative %: 70 %
Platelets: 283 10*3/uL (ref 150–400)
RBC: 4.31 MIL/uL (ref 3.87–5.11)
RDW: 14.5 % (ref 11.5–15.5)
WBC: 8.5 10*3/uL (ref 4.0–10.5)
nRBC: 0 % (ref 0.0–0.2)

## 2019-03-26 LAB — CMP (CANCER CENTER ONLY)
ALT: 44 U/L (ref 0–44)
AST: 13 U/L — ABNORMAL LOW (ref 15–41)
Albumin: 3.7 g/dL (ref 3.5–5.0)
Alkaline Phosphatase: 86 U/L (ref 38–126)
Anion gap: 9 (ref 5–15)
BUN: 29 mg/dL — ABNORMAL HIGH (ref 8–23)
CO2: 23 mmol/L (ref 22–32)
Calcium: 9.5 mg/dL (ref 8.9–10.3)
Chloride: 104 mmol/L (ref 98–111)
Creatinine: 1.56 mg/dL — ABNORMAL HIGH (ref 0.44–1.00)
GFR, Est AFR Am: 37 mL/min — ABNORMAL LOW (ref >60)
GFR, Estimated: 32 mL/min — ABNORMAL LOW (ref >60)
Glucose, Bld: 153 mg/dL — ABNORMAL HIGH (ref 70–99)
Potassium: 4.8 mmol/L (ref 3.5–5.1)
Sodium: 136 mmol/L (ref 135–145)
Total Bilirubin: 0.5 mg/dL (ref 0.3–1.2)
Total Protein: 6.7 g/dL (ref 6.5–8.1)

## 2019-03-26 MED ORDER — SODIUM CHLORIDE 0.9 % IV SOLN
Freq: Once | INTRAVENOUS | Status: AC
  Administered 2019-03-26: 13:00:00 via INTRAVENOUS
  Filled 2019-03-26: qty 250

## 2019-03-26 MED ORDER — SODIUM CHLORIDE 0.9% FLUSH
10.0000 mL | INTRAVENOUS | Status: DC | PRN
  Administered 2019-03-26: 10 mL
  Filled 2019-03-26: qty 10

## 2019-03-26 MED ORDER — SODIUM CHLORIDE 0.9 % IV SOLN
240.0000 mg | Freq: Once | INTRAVENOUS | Status: AC
  Administered 2019-03-26: 240 mg via INTRAVENOUS
  Filled 2019-03-26: qty 24

## 2019-03-26 MED ORDER — HEPARIN SOD (PORK) LOCK FLUSH 100 UNIT/ML IV SOLN
500.0000 [IU] | Freq: Once | INTRAVENOUS | Status: AC | PRN
  Administered 2019-03-26: 500 [IU]
  Filled 2019-03-26: qty 5

## 2019-03-26 MED ORDER — SODIUM CHLORIDE 0.9% FLUSH
10.0000 mL | Freq: Once | INTRAVENOUS | Status: AC
  Administered 2019-03-26: 11:00:00 10 mL
  Filled 2019-03-26: qty 10

## 2019-03-26 NOTE — Progress Notes (Signed)
Per [name], [facility] for treatment today with creatine 1.56

## 2019-03-26 NOTE — Telephone Encounter (Signed)
Returned patient's phone call regarding rescheduling 12/17 and 01/04 appointments, rescheduled appointments for 12/01 and 01/06 per patient's request. Cancelled 12/17 follow-up visit per 12/04 los.

## 2019-03-26 NOTE — Progress Notes (Signed)
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 She is here for C23D1 of Nivolumab. The patient's last visit with [name] was on 02/12/2019. The pt reports that she is doing well overall.  The pt reports she is doing fine.   She was very itchy last week.  She states that she is not thirsty.  Lab results today (03/26/19) of CBC w/diff and CMP is as follows: all values are WNL except for Abs Immature Granulocytes at 0.11, Glucose Bld at 153, UN at 29, Creatinine at 1.56, AST at 13, GFR Est Non Af Am at 32, GFR Est AFR Am at 37  PENDING TSH .  On review of systems, pt reports doing better and denies abdominal pain, diarrhea, leg swelling and any other symptoms.   MEDICAL HISTORY:   Past Medical History:  Diagnosis Date   Anemia    Arthritis    lower back, hips, hands   Biliary stricture    Diabetes mellitus ([place])    type 2 - no meds, diet controlled   Early cataracts, bilateral    Elevated liver enzymes    GERD (gastroesophageal reflux disease)    occasional - diet controlled   History of hiatal hernia    HTN (hypertension)    Hyperlipidemia    Hypothyroidism    left renal ca dx'd 2018   renal cancer - left kidney removed, pill chemo x 1 yr   Myocardial infarction ([place]) 1991   no deficits   SVD (spontaneous vaginal delivery)    x 3   Wears glasses    Dyslipidemia Osteoarthritis Ex-smoker Coronary artery disease Thyroid disorder-was apparently on levothyroxine 25 g daily which has subsequently been  discontinued . Mitral regurgitation B12 deficiency  hiatal hernia with esophagitis   SURGICAL HISTORY: Past Surgical History:  Procedure Laterality Date   BALLOON DILATION N/A 07/23/2018   Procedure: BALLOON DILATION;  Surgeon: Mansouraty, Telford Nab., MD;  Location: [place];  Service: Gastroenterology;  Laterality: N/A;   BILIARY BRUSHING  08/10/2018   Procedure: BILIARY BRUSHING;  Surgeon: Rush Landmark Telford Nab., MD;  Location: [place];  Service: Gastroenterology;;   BILIARY BRUSHING  11/30/2018   Procedure: BILIARY BRUSHING;  Surgeon: Irving Copas., MD;  Location: [place];  Service: Gastroenterology;;   BILIARY DILATION  08/10/2018   Procedure: BILIARY DILATION;  Surgeon: Irving Copas., MD;  Location: [place];  Service: Gastroenterology;;   BILIARY DILATION  08/27/2018   Procedure: BILIARY DILATION;  Surgeon: Irving Copas., MD;  Location: [place];  Service: Gastroenterology;;   BILIARY STENT PLACEMENT  08/10/2018   Procedure: BILIARY STENT PLACEMENT;  Surgeon: Irving Copas., MD;  Location: [place];  Service: Gastroenterology;;   BILIARY STENT PLACEMENT  08/27/2018   Procedure: BILIARY STENT PLACEMENT;  Surgeon: Irving Copas., MD;  Location: [place];  Service: Gastroenterology;;   BILIARY STENT PLACEMENT  11/30/2018   Procedure: BILIARY STENT PLACEMENT;  Surgeon: Irving Copas., MD;  Location: [place];  Service: Gastroenterology;;   BIOPSY  07/23/2018   Procedure: BIOPSY;  Surgeon: Irving Copas., MD;  Location: [place];  Service: Gastroenterology;;   CHOLECYSTECTOMY N/A 09/02/2018   Procedure: LAPAROSCOPIC CHOLECYSTECTOMY;  Surgeon: Stark Klein, MD;  Location: [place];  Service: General;  Laterality: N/A;   COLONOSCOPY     normal    ENDOSCOPIC RETROGRADE CHOLANGIOPANCREATOGRAPHY (ERCP) WITH PROPOFOL N/A 08/10/2018   Procedure: ENDOSCOPIC RETROGRADE CHOLANGIOPANCREATOGRAPHY  (ERCP) WITH PROPOFOL;  Surgeon: Irving Copas., MD;  Location: [place];  Service: Gastroenterology;  Laterality: N/A;   ENDOSCOPIC RETROGRADE CHOLANGIOPANCREATOGRAPHY (ERCP) WITH PROPOFOL N/A 11/30/2018   Procedure: ENDOSCOPIC RETROGRADE CHOLANGIOPANCREATOGRAPHY (ERCP) WITH PROPOFOL;  Surgeon: Rush Landmark Telford Nab., MD;  Location: [place];  Service: Gastroenterology;  Laterality: N/A;   ERCP N/A 07/23/2018   Procedure: ENDOSCOPIC RETROGRADE CHOLANGIOPANCREATOGRAPHY (ERCP);  Surgeon: Irving Copas., MD;  Location: [place];  Service: Gastroenterology;  Laterality: N/A;   ERCP N/A 08/27/2018   Procedure: ENDOSCOPIC RETROGRADE CHOLANGIOPANCREATOGRAPHY (ERCP);  Surgeon: Irving Copas., MD;  Location: [place];  Service: Gastroenterology;  Laterality: N/A;   ESOPHAGOGASTRODUODENOSCOPY (EGD) WITH PROPOFOL N/A 08/27/2018   Procedure: ESOPHAGOGASTRODUODENOSCOPY (EGD) WITH PROPOFOL;  Surgeon: Rush Landmark Telford Nab., MD;  Location: [place];  Service: Gastroenterology;  Laterality: N/A;   EUS N/A 08/27/2018   Procedure: ESOPHAGEAL ENDOSCOPIC ULTRASOUND (EUS) RADIAL;  Surgeon: Rush Landmark Telford Nab., MD;  Location: [place];  Service: Gastroenterology;  Laterality: N/A;   FINE NEEDLE ASPIRATION  08/27/2018   Procedure: FINE NEEDLE ASPIRATION (FNA) LINEAR;  Surgeon: Irving Copas., MD;  Location: [place];  Service: Gastroenterology;;   IR IMAGING GUIDED PORT INSERTION  01/08/2018   LAPAROSCOPIC NEPHRECTOMY Left 08/01/2016   Procedure: LAPAROSCOPIC  RADICAL NEPHRECTOMY/ REPAIR OF UMBILICAL HERNIA;  Surgeon: Raynelle Bring, MD;  Location: [facility];  Service: Urology;  Laterality: Left;   REMOVAL OF STONES  07/23/2018   Procedure: REMOVAL OF GALL STONES;  Surgeon: Rush Landmark Telford Nab., MD;  Location: [place];  Service: Gastroenterology;;   REMOVAL OF STONES  08/10/2018   Procedure: REMOVAL OF STONES;  Surgeon: Irving Copas., MD;   Location: [place];  Service: Gastroenterology;;   REMOVAL OF STONES  08/27/2018   Procedure: REMOVAL OF STONES;  Surgeon: Irving Copas., MD;  Location: [place];  Service: Gastroenterology;;   [name]  07/23/2018   Procedure: [name];  Surgeon: Irving Copas., MD;  Location: [place];  Service: Gastroenterology;;   Lavell Islam REMOVAL  08/27/2018   Procedure: STENT REMOVAL;  Surgeon: Irving Copas., MD;  Location: [place];  Service: Gastroenterology;;   Lavell Islam REMOVAL  11/30/2018   Procedure: STENT REMOVAL;  Surgeon: Irving Copas., MD;  Location: [facility];  Service: Gastroenterology;;   UPPER GI ENDOSCOPY     x 1   No reported past surgeries EGD 05/01/2016 Dr. Earley Brooke Colonoscopy 03/2016 Dr. Earley Brooke  SOCIAL HISTORY: Social History   Socioeconomic History   Marital status: Married    Spouse name: Not on file   Number of children: 3   Years of education: Not on file   Highest education level: Not on file  Occupational History   Occupation: retired  Scientist, product/process development strain: Not on file   Food insecurity    Worry: Not on file    Inability: Not on file   Transportation needs    Medical: Not on file    Non-medical: Not on file  Tobacco Use   Smoking status: Former Smoker    Packs/day: 1.00    Years: 8.00    Pack years: 8.00  Types: Cigarettes    Quit date: 06/18/1964    Years since quitting: 54.8   Smokeless tobacco: Never Used  Substance and Sexual Activity   Alcohol use: No   Drug use: No   Sexual activity: Not on file  Lifestyle   Physical activity    Days per week: Not on file    Minutes per session: Not on file   Stress: Not on file  Relationships   Social connections    Talks on phone: Not on file    Gets together: Not on file    Attends religious service: Not on file    Active member of club or organization: Not on file    Attends meetings of clubs or  organizations: Not on file    Relationship status: Not on file   Intimate partner violence    Fear of current or ex partner: Not on file    Emotionally abused: Not on file    Physically abused: Not on file    Forced sexual activity: Not on file  Other Topics Concern   Not on file  Social History Narrative   Married.  Three children   Patient lives in [place] Works as a Solicitor part-time Ex-smoker quit long time ago In 1961 . Smoked 2 packs per day for 8 years prior to that .  or a lot of stress since her daughter is also getting treated for stage IV uterine cancer.  FAMILY HISTORY:  Mother deceased Father with asthma and emphysema died at 33 years with the MI. Brother deceased of heart disease   ALLERGIES:  is allergic to adhesive [tape]; gabapentin; nsaids; and statins.  MEDICATIONS:  Current Outpatient Medications  Medication Sig Dispense Refill   amLODipine (NORVASC) 5 MG tablet Take 5 mg by mouth daily.   6   atorvastatin (LIPITOR) 40 MG tablet Take 40 mg by mouth at bedtime.      carvedilol (COREG) 6.25 MG tablet Take 6.25 mg by mouth 2 (two) times daily.  6   clobetasol ointment (TEMOVATE) [id number] % Apply 1 application topically 2 (two) times daily as needed (lichen sclerosis).      Denosumab (XGEVA [place]) Inject into the skin every 6 (six) weeks.     hydrocortisone (CORTEF) 10 MG tablet Take 5-10 mg by mouth See admin instructions. Take 1 tablet (10 mg) by mouth in the morning & 1/2 tablet (5 mg) by mouth in the evening.     levothyroxine (SYNTHROID) 75 MCG tablet Take 75 mcg by mouth daily before breakfast.      lidocaine (LIDODERM) 5 % Place 1 patch onto the skin daily. Remove & Discard patch within 12 hours or as directed by MD Using on Right Hip 30 patch 0   lidocaine-prilocaine (EMLA) cream Apply 1 application topically daily as needed (prior to port access). 30 g 1   Nivolumab (OPDIVO IV) Inject 1 Dose into the vein every 14 (fourteen) days.  George     omeprazole (PRILOSEC) 20 MG capsule Take 1 capsule (20 mg total) by mouth daily. 60 capsule 0   repaglinide (PRANDIN) 0.5 MG tablet Take 0.5 mg by mouth 2 (two) times daily before a meal.     No current facility-administered medications for this visit.    Facility-Administered Medications Ordered in Other Visits  Medication Dose Route Frequency Provider Last Rate Last Dose   sodium chloride flush (NS) 0.9 % injection 10 mL  10 mL Intracatheter PRN Truitt Merle, MD  10 mL at 11/20/18 1232    REVIEW OF SYSTEMS:   A 10+ POINT REVIEW OF SYSTEMS WAS OBTAINED including neurology, dermatology, psychiatry, cardiac, respiratory, lymph, extremities, GI, GU, Musculoskeletal, constitutional, breasts, reproductive, HEENT.  All pertinent positives are noted in the HPI.  All others are negative.     PHYSICAL EXAMINATION:  ECOG FS:2 - Symptomatic, <50% confined to bed  Vitals:   03/26/19 1053  BP: (!) 148/71  Pulse: 70  Resp: 17  Temp: 98.2 F (36.8 C)  SpO2: 100%   Wt Readings from Last 3 Encounters:  03/26/19 131 lb 8 oz (59.6 kg)  03/12/19 129 lb (58.5 kg)  02/12/19 129 lb 9.6 oz (58.8 kg)   Body mass index is 26.56 kg/m.    GENERAL:alert, in no acute distress and comfortable SKIN: no acute rashes, no significant lesions EYES: conjunctiva are pink and non-injected, sclera anicteric OROPHARYNX: MMM, no exudates, no oropharyngeal erythema or ulceration NECK: supple, no JVD LYMPH:  no palpable lymphadenopathy in the cervical, axillary or inguinal regions LUNGS: clear to auscultation b/l with normal respiratory effort HEART: regular rate & rhythm ABDOMEN:  normoactive bowel sounds , non tender, not distended. Extremity: no pedal edema PSYCH: alert & oriented x 3 with fluent speech NEURO: no focal motor/sensory deficits  LABORATORY DATA:  I have reviewed the data as listed  . CBC Latest Ref Rng & Units 03/26/2019 03/12/2019 02/26/2019  WBC 4.0 - 10.5 K/uL 8.5  8.4 8.1  Hemoglobin 12.0 - 15.0 g/dL 12.1 11.4(L) 11.7(L)  Hematocrit 36.0 - 46.0 % 38.9 34.9(L) 36.9  Platelets 150 - 400 K/uL 283 281 264    CBC    Component Value Date/Time   WBC 8.5 03/26/2019 1034   RBC 4.31 03/26/2019 1034   HGB 12.1 03/26/2019 1034   HGB 11.4 (L) 09/11/2018 0900   HGB 11.2 (L) 04/04/2017 0830   HCT 38.9 03/26/2019 1034   HCT 35.2 04/04/2017 0830   PLT 283 03/26/2019 1034   PLT 277 09/11/2018 0900   PLT 250 04/04/2017 0830   MCV 90.3 03/26/2019 1034   MCV 107.6 (H) 04/04/2017 0830   MCH 28.1 03/26/2019 1034   MCHC 31.1 03/26/2019 1034   RDW 14.5 03/26/2019 1034   RDW 14.0 04/04/2017 0830   LYMPHSABS 1.5 03/26/2019 1034   LYMPHSABS 1.6 04/04/2017 0830   MONOABS 0.9 03/26/2019 1034   MONOABS 0.4 04/04/2017 0830   EOSABS 0.1 03/26/2019 1034   EOSABS 0.1 04/04/2017 0830   BASOSABS 0.1 03/26/2019 1034   BASOSABS 0.0 04/04/2017 0830     . CMP Latest Ref Rng & Units 03/26/2019 03/12/2019 02/26/2019  Glucose 70 - 99 mg/dL 153(H) 178(H) 119(H)  BUN 8 - 23 mg/dL 29(H) 21 22  Creatinine 0.44 - 1.00 mg/dL 1.56(H) 1.49(H) 1.39(H)  Sodium 135 - 145 mmol/L 136 138 134(L)  Potassium 3.5 - 5.1 mmol/L 4.8 4.5 4.6  Chloride 98 - 111 mmol/L 104 108 104  CO2 22 - 32 mmol/L 23 21(L) 20(L)  Calcium 8.9 - 10.3 mg/dL 9.5 8.4(L) 9.1  Total Protein 6.5 - 8.1 g/dL 6.7 6.4(L) 6.6  Total Bilirubin 0.3 - 1.2 mg/dL 0.5 0.4 0.4  Alkaline Phos 38 - 126 U/L 86 70 70  AST 15 - 41 U/L 13(L) 15 27  ALT 0 - 44 U/L 44 13 23   B12 level 299 (OSH)-->455  . Lab Results  Component Value Date   LDH 136 03/13/2018        08/27/18 Surgical Pathology from  Cholecystectomy:         RADIOGRAPHIC STUDIES: I have personally reviewed the radiological images as listed and agreed with the findings in the report. Nm Pet Image Restag (ps) Skull Base To Thigh  Result Date: 01/03/2017 IMPRESSION: 1. There is a new lesion in the hepatic dome which is FDG avid and low in attenuation  on CT imaging consistent with metastatic disease. Another region of uptake in the left hepatic lobe posteriorly demonstrates no CT correlate. A second subtle metastasis is not excluded on today's study. An MRI could better assess for other hepatic metastases if clinically warranted. 2. New metastatic lesion in the left side of T8. 3. New pulmonary nodule in the right upper lobe. This is too small to characterize but suspicious. Recommend attention on follow-up. No FDG avid nodules or other enlarging nodules. 4. The uptake at the previous left renal artery has almost resolved in the interval and is favored to be post therapeutic. Recommend attention on follow-up. 5. The metastasis in the posterior right hilum seen previously has resolved. Electronically Signed   By: Dorise Bullion III M.D   On: 01/03/2017 11:16   .No results found.  ASSESSMENT & PLAN:    78 y.o. 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 PET/CT 06/19/2017 -- consistent with partial metabolic response to treatment.  Rt adrenal gland bx - showed clear cell RCC  S/p CYtoreductive left radical nephrectomy and left adrenal gland resection on 08/01/2016 by Dr Alinda Money.  10/16/17 PET/CT revealed Continued improvement, with the T7 metastatic lesion no longer significantly hypermetabolic. The previous right lower lobe pulmonary nodule is even less apparent, perhaps about 2 mm in diameter today; given that this measured 6 mm on 03/28/2017 this probably represents an effectively treated metastatic lesion. Currently no appreciable hypermetabolic activity is identified to suggest active malignancy. Distended gallbladder with gallbladder wall thickening and gallstones. Correlate clinically in assessing for cholecystitis. Small but abnormal amount of free pelvic fluid, nonspecific. Other imaging findings of potential clinical  significance: Chronic ethmoid sinusitis. Aortic Atherosclerosis. Stable 5 mm right middle lobe pulmonary nodule, not hypermetabolic but below sensitive PET-CT size thresholds. Left nephrectomy. Notable pelvic floor laxity with cystocele. Chronic bilateral Sacroiliitis.  04/03/18 PET/CT revealed No evidence for new or progressive hypermetabolic disease on today's study to suggest metastatic progression. 2. Stable appearance of the T7 metastatic lesion without Hypermetabolism. 3. Tiny focus of FDG accumulation identified along the skin of the low right inguinal fold. No associated lesion evident on CT. This may be related to urinary contaminant. 4. Cholelithiasis with similar appearance of diffuse gallbladder wall thickening. 5. Stable 5 mm right middle lobe pulmonary nodule. 6.  Aortic Atherosclerois. 7. Diffuse colonic diverticulosis.   09/18/18 CT A/P revealed "Common duct stent in place. Improvement to resolution in biliary duct dilatation compared to 08/29/2018. Interval cholecystectomy without acute complication. 2. Left nephrectomy, without evidence of metastatic disease. 3.  Tiny hiatal hernia. 4. Coronary artery atherosclerosis. Aortic Atherosclerosis. 5. Mild limitations secondary to lack of IV contrast. 6. Marked pelvic floor laxity with cystocele and rectal prolapse".  #2 b/l adrenal metastases from [place] s/p left adrenalectomy with adrenal insuff - follows with Dr Buddy Duty.  #3 Small pulmonary lesions -- 10/16/17 PET/CT showed pulmonary less apparent than 03/28/17 PET/CT, decreasing from 74mm to 46mm diameter.   MRI brain shows no evidence of metastatic disease  #4 T7/8 Bone metastases - received  Xgeva every 4 weeks from May 2018 to June 2019. 10/16/17 PET/CT revealed improvement with T7 metastatic lesion no longer significantly hypermetabolic.  -on Marchelle Folks  #5 ?liver mets- rpt PET/CT from 10/16/2017 shows no overt evidence of metastatic disease in the liver.  #6 Grade 1 Nausea - improved and  intermittent. Hasnt used her anti-emetic as instructed so some nausea and decreased po intake.  #7 Grade 1 Diarrhea - resolved  #8 Hyponatremia -  resolved with sodium at 136 - likely related to some element of adrenal insufficiency, diarrhea, limited by mouth intake. Primarily solute free fluid intake.   #9 Acute on chronic renal insufficiency Creatine stable 1.56  #10 Hyperkalemia due to ACEI + RF- resolved  #11 Hemorrhoids -chronic with some bleeding --Recommended Sitz bath and OTC Anusol or Nupercaine for her hemorrhoid relief. -f/u with PCP for continued mx   #12 Moderate protein calorie malnutrition Weight has stabilized and improved Wt Readings from Last 3 Encounters:  03/12/19 129 lb (58.5 kg)  02/12/19 129 lb 9.6 oz (58.8 kg)  01/15/19 129 lb 6.4 oz (58.7 kg)   Plan: Continue healthy po intake/diabetic diet -Previously recommended the patient to drink atleast 48-64 oz of fluids daily -f/u with PCP/Cardiology for diuretics management  #13 Hypothyroidism/Adrenal insufficiency/Diabetes -Continue being followed by Dr. Buddy Duty  #14 HTN - ?control. Patient tends to be anxious and has higher blood pressures in the clinic. She can have increased blood pressure from S[place] as well. Plan: -continued close f/u with her PCP /cardiology regarding the many elements necessary for her care that are not directly related to her oncology care. -ACEI held due to AKI and hyperkalemia - following with cardiology to mx this. Has been started on Amlodipine instead.  #15 Grade 1 mucositis- resolved -Advised the patient to continue use of Magic Mouthwash to aid with nutrition  #16 Newly diagnosed ?CAD - positive cardiac nuclear stress test Following with cardiology and nephrology ? Need for pre-operative cardiac cath  #17 Ch/o holedocholithiasis and cholelithiasis 07/10/18 US Abdomen revealed Biliary duct dilatation with 8 mm calculus at the level of the ampulla in the distal common bile duct.  2. Cholelithiasis. No gallbladder wall thickening or pericholecystic fluid. 3. Appearance of the liver raises concern for underlying hepatic cirrhosis. No focal liver lesions are demonstrable. 4.  Left kidney absent. 5.  Small cysts in right kidney. 6.  Aortic Atherosclerosis.  S/p ERCP on 07/23/18 with Dr. Valarie Merino Mansouraty  09/02/18 Cytopathology from ERCP was suspicious for malignant cells in bile duct, but was not definitive, other two findings were benign.  PLAN:  -Discussed pt labwork today, 03/26/19; all values are WNL except for Abs Immature Granulocytes at 0.11, Glucose Bld at 153, UN at 29, Creatinine at 1.56, AST at 13, GFR Est Non Af Am at 32, GFR Est AFR Am at 37  PENDING TSH . -Discussed moisturizing to help dry skin and itching  -Discussed using topical medication, diclofenac, for pain -Advised pt to take precautions to avoid exposure to COVID. Pt stated she would not be interested in a COVID Vaccine when made available  -no lab or clinical evidence of RCC progression at this time. -no prohibitive toxicities from Nivolumab at this time.  FOLLOW UP: Continue Nivolumab q2weeks with port flush and labs May cancel MD visit on 12/17 Next MD visit on 04/25/2018  The total time spent in the appt was 25 minutes and more than 50% was on counseling and direct patient cares.  All of the patient's questions  were answered with apparent satisfaction. The patient knows to call the clinic with any problems, questions or concerns.    Sullivan Lone MD MS AAHIVMS [facility] Hematology/Oncology Physician [facility]  (Office):       [phone] (Work cell):  [phone] (Fax):           [phone]   I, Scot Dock, am acting as a scribe for Dr. Sullivan Lone.   .I have reviewed the above documentation for accuracy and completeness, and I agree with the above. Brunetta Genera MD

## 2019-03-26 NOTE — Patient Instructions (Signed)
[facility] Discharge Instructions for Patients Receiving Chemotherapy  Today you received the following chemotherapy agents: Opdivo  To help prevent nausea and vomiting after your treatment, we encourage you to take your nausea medication as directed by your MD.   If you develop nausea and vomiting that is not controlled by your nausea medication, call the clinic.   BELOW ARE SYMPTOMS THAT SHOULD BE REPORTED IMMEDIATELY:  *FEVER GREATER THAN 100.5 F  *CHILLS WITH OR WITHOUT FEVER  NAUSEA AND VOMITING THAT IS NOT CONTROLLED WITH YOUR NAUSEA MEDICATION  *UNUSUAL SHORTNESS OF BREATH  *UNUSUAL BRUISING OR BLEEDING  TENDERNESS IN MOUTH AND THROAT WITH OR WITHOUT PRESENCE OF ULCERS  *URINARY PROBLEMS  *BOWEL PROBLEMS  UNUSUAL RASH Items with * indicate a potential emergency and should be followed up as soon as possible.  Feel free to call the clinic should you have any questions or concerns. The clinic phone number is [phone].  Please show the CHEMO ALERT CARD at check-in to the Emergency Department and triage nurse. Coronavirus (COVID-19) Are you at risk?  Are you at risk for the Coronavirus (COVID-19)?  To be considered HIGH RISK for Coronavirus (COVID-19), you have to meet the following criteria:  . Traveled to [place], [place], [name], [place] or [place]; or in the [place] to [place], [place], [place], or [place]; and have fever, cough, and shortness of breath within the last 2 weeks of travel OR . Been in close contact with a person diagnosed with COVID-19 within the last 2 weeks and have fever, cough, and shortness of breath . IF YOU DO NOT MEET THESE CRITERIA, YOU ARE CONSIDERED LOW RISK FOR COVID-19.  What to do if you are HIGH RISK for COVID-19?  . If you are having a medical emergency, call 911. . Seek medical care right away. Before you go to a doctor's office, urgent care or emergency department, call ahead and tell them about  your recent travel, contact with someone diagnosed with COVID-19, and your symptoms. You should receive instructions from your physician's office regarding next steps of care.  . When you arrive at healthcare provider, tell the healthcare staff immediately you have returned from visiting [place], [place], [place], [place] or [name]; or traveled in the [place] to [place], [place], [place], or [place]; in the last two weeks or you have been in close contact with a person diagnosed with COVID-19 in the last 2 weeks.   . Tell the health care staff about your symptoms: fever, cough and shortness of breath. . After you have been seen by a medical provider, you will be either: o Tested for (COVID-19) and discharged home on quarantine except to seek medical care if symptoms worsen, and asked to  - Stay home and avoid contact with others until you get your results (4-5 days)  - Avoid travel on public transportation if possible (such as bus, train, or airplane) or o Sent to the Emergency Department by EMS for evaluation, COVID-19 testing, and possible admission depending on your condition and test results.  What to do if you are LOW RISK for COVID-19?  Reduce your risk of any infection by using the same precautions used for avoiding the common cold or flu:  . Wash your hands often with soap and warm water for at least 20 seconds.  If soap and water are not readily available, use an alcohol-based hand sanitizer with at least 60% alcohol.  . If coughing   or sneezing, cover your mouth and nose by coughing or sneezing into the elbow areas of your shirt or coat, into a tissue or into your sleeve (not your hands). . Avoid shaking hands with others and consider head nods or verbal greetings only. . Avoid touching your eyes, nose, or mouth with unwashed hands.  . Avoid close contact with people who are sick. . Avoid places or events with large numbers of people in one location, like concerts or sporting  events. . Carefully consider travel plans you have or are making. . If you are planning any travel outside or inside the [place], visit the CDC's Travelers' Health webpage for the latest health notices. . If you have some symptoms but not all symptoms, continue to monitor at home and seek medical attention if your symptoms worsen. . If you are having a medical emergency, call 911.   ADDITIONAL HEALTHCARE OPTIONS FOR PATIENTS   Telehealth / e-Visit: [email or web address]         [facility]: [phone]  [facility]: [phone]                   [facility]: [phone]     

## 2019-03-29 ENCOUNTER — Telehealth: Payer: Self-pay | Admitting: Hematology

## 2019-03-29 LAB — TSH: TSH: 0.96 u[IU]/mL (ref 0.308–3.960)

## 2019-03-29 NOTE — Telephone Encounter (Signed)
Per 12/4 los appts were already adjusted,

## 2019-04-08 ENCOUNTER — Ambulatory Visit: Payer: 59

## 2019-04-08 ENCOUNTER — Ambulatory Visit: Payer: 59 | Admitting: Hematology

## 2019-04-08 ENCOUNTER — Other Ambulatory Visit: Payer: 59

## 2019-04-09 ENCOUNTER — Other Ambulatory Visit: Payer: Self-pay

## 2019-04-09 ENCOUNTER — Inpatient Hospital Stay: Payer: Medicare Other

## 2019-04-09 VITALS — BP 162/71 | HR 65 | Temp 98.5°F | Resp 16

## 2019-04-09 DIAGNOSIS — Z95828 Presence of other vascular implants and grafts: Secondary | ICD-10-CM

## 2019-04-09 DIAGNOSIS — C642 Malignant neoplasm of left kidney, except renal pelvis: Secondary | ICD-10-CM

## 2019-04-09 DIAGNOSIS — Z5112 Encounter for antineoplastic immunotherapy: Secondary | ICD-10-CM | POA: Diagnosis not present

## 2019-04-09 DIAGNOSIS — C7951 Secondary malignant neoplasm of bone: Secondary | ICD-10-CM

## 2019-04-09 DIAGNOSIS — Z7189 Other specified counseling: Secondary | ICD-10-CM

## 2019-04-09 LAB — CMP (CANCER CENTER ONLY)
ALT: 95 U/L — ABNORMAL HIGH (ref 0–44)
AST: 16 U/L (ref 15–41)
Albumin: 3.5 g/dL (ref 3.5–5.0)
Alkaline Phosphatase: 87 U/L (ref 38–126)
Anion gap: 10 (ref 5–15)
BUN: 20 mg/dL (ref 8–23)
CO2: 23 mmol/L (ref 22–32)
Calcium: 9.5 mg/dL (ref 8.9–10.3)
Chloride: 108 mmol/L (ref 98–111)
Creatinine: 1.39 mg/dL — ABNORMAL HIGH (ref 0.44–1.00)
GFR, Est AFR Am: 42 mL/min — ABNORMAL LOW (ref >60)
GFR, Estimated: 36 mL/min — ABNORMAL LOW (ref >60)
Glucose, Bld: 125 mg/dL — ABNORMAL HIGH (ref 70–99)
Potassium: 4.1 mmol/L (ref 3.5–5.1)
Sodium: 141 mmol/L (ref 135–145)
Total Bilirubin: 0.4 mg/dL (ref 0.3–1.2)
Total Protein: 6.4 g/dL — ABNORMAL LOW (ref 6.5–8.1)

## 2019-04-09 LAB — CBC WITH DIFFERENTIAL/PLATELET
Abs Immature Granulocytes: 0.1 10*3/uL — ABNORMAL HIGH (ref 0.00–0.07)
Basophils Absolute: 0.1 10*3/uL (ref 0.0–0.1)
Basophils Relative: 1 %
Eosinophils Absolute: 0.2 10*3/uL (ref 0.0–0.5)
Eosinophils Relative: 2 %
HCT: 37.1 % (ref 36.0–46.0)
Hemoglobin: 11.8 g/dL — ABNORMAL LOW (ref 12.0–15.0)
Immature Granulocytes: 1 %
Lymphocytes Relative: 17 %
Lymphs Abs: 1.2 10*3/uL (ref 0.7–4.0)
MCH: 28.7 pg (ref 26.0–34.0)
MCHC: 31.8 g/dL (ref 30.0–36.0)
MCV: 90.3 fL (ref 80.0–100.0)
Monocytes Absolute: 0.8 10*3/uL (ref 0.1–1.0)
Monocytes Relative: 12 %
Neutro Abs: 4.7 10*3/uL (ref 1.7–7.7)
Neutrophils Relative %: 67 %
Platelets: 254 10*3/uL (ref 150–400)
RBC: 4.11 MIL/uL (ref 3.87–5.11)
RDW: 14.7 % (ref 11.5–15.5)
WBC: 7.1 10*3/uL (ref 4.0–10.5)
nRBC: 0 % (ref 0.0–0.2)

## 2019-04-09 MED ORDER — SODIUM CHLORIDE 0.9 % IV SOLN
Freq: Once | INTRAVENOUS | Status: AC
  Filled 2019-04-09: qty 250

## 2019-04-09 MED ORDER — DENOSUMAB 120 MG/1.7ML ~~LOC~~ SOLN
SUBCUTANEOUS | Status: AC
  Filled 2019-04-09: qty 1.7

## 2019-04-09 MED ORDER — HEPARIN SOD (PORK) LOCK FLUSH 100 UNIT/ML IV SOLN
500.0000 [IU] | Freq: Once | INTRAVENOUS | Status: AC | PRN
  Administered 2019-04-09: 500 [IU]
  Filled 2019-04-09: qty 5

## 2019-04-09 MED ORDER — SODIUM CHLORIDE 0.9% FLUSH
10.0000 mL | INTRAVENOUS | Status: DC | PRN
  Administered 2019-04-09: 10 mL
  Filled 2019-04-09: qty 10

## 2019-04-09 MED ORDER — SODIUM CHLORIDE 0.9 % IV SOLN
240.0000 mg | Freq: Once | INTRAVENOUS | Status: AC
  Administered 2019-04-09: 240 mg via INTRAVENOUS
  Filled 2019-04-09: qty 24

## 2019-04-09 MED ORDER — SODIUM CHLORIDE 0.9% FLUSH
10.0000 mL | Freq: Once | INTRAVENOUS | Status: AC
  Administered 2019-04-09: 10 mL
  Filled 2019-04-09: qty 10

## 2019-04-09 MED ORDER — DENOSUMAB 120 MG/1.7ML ~~LOC~~ SOLN
120.0000 mg | Freq: Once | SUBCUTANEOUS | Status: AC
  Administered 2019-04-09: 120 mg via SUBCUTANEOUS

## 2019-04-09 NOTE — Progress Notes (Signed)
OK to treat with elevated liver enzyme per Dr Irene Limbo.

## 2019-04-09 NOTE — Patient Instructions (Signed)
[facility] Discharge Instructions for Patients Receiving Chemotherapy  Today you received the following immunotherapy agents:  Opdivo   To help prevent nausea and vomiting after your treatment, we encourage you to take your nausea medication as needed.   If you develop nausea and vomiting that is not controlled by your nausea medication, call the clinic.   BELOW ARE SYMPTOMS THAT SHOULD BE REPORTED IMMEDIATELY:  *FEVER GREATER THAN 100.5 F  *CHILLS WITH OR WITHOUT FEVER  NAUSEA AND VOMITING THAT IS NOT CONTROLLED WITH YOUR NAUSEA MEDICATION  *UNUSUAL SHORTNESS OF BREATH  *UNUSUAL BRUISING OR BLEEDING  TENDERNESS IN MOUTH AND THROAT WITH OR WITHOUT PRESENCE OF ULCERS  *URINARY PROBLEMS  *BOWEL PROBLEMS  UNUSUAL RASH Items with * indicate a potential emergency and should be followed up as soon as possible.  Feel free to call the clinic should you have any questions or concerns. The clinic phone number is [phone].  Please show the CHEMO ALERT CARD at check-in to the Emergency Department and triage nurse.   

## 2019-04-26 ENCOUNTER — Ambulatory Visit: Payer: 59

## 2019-04-26 ENCOUNTER — Other Ambulatory Visit: Payer: 59

## 2019-04-26 ENCOUNTER — Ambulatory Visit: Payer: 59 | Admitting: Hematology

## 2019-04-27 NOTE — Progress Notes (Signed)
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 She is here for C30D1 of Nivolumab. The patient's last visit with [name] was on 03/26/2019. The pt reports that she is doing well overall.  The pt reports that her granddaughter recently contracted Covid-19. Pt last saw her granddaughter on Christmas but did not touch her and wore a mask during their interaction. She denies any respiratory symptoms or fevers.   Lab results today (04/28/19) of CBC w/diff and CMP is as follows: all values are WNL except for Hgb at 11.7, Abs Immature Granulocytes at 0.14K, Glucose at 175, Creatinine at 1.46, Calcium at 8.8, Total Protein at 6.3, Albumin at 3.4, AST at 11, ALT at 59, GFR Est Non Af Am at 34.  On review of systems, pt denies respiratory symptoms, fevers, abdominal pain, urinary symptoms and any other symptoms.   MEDICAL HISTORY:   Past Medical History:  Diagnosis Date  . Anemia   . Arthritis    lower back, hips, hands  . Biliary stricture   . Diabetes mellitus ([place])    type 2 - no meds, diet controlled  . Early cataracts, bilateral   . Elevated liver enzymes   . GERD (gastroesophageal reflux disease)    occasional - diet controlled  . History of hiatal hernia   . HTN (hypertension)   . Hyperlipidemia   . Hypothyroidism   . left renal ca dx'd 2018   renal cancer - left kidney removed, pill chemo x 1 yr  . Myocardial infarction ([place]) 1991   no deficits  . SVD (spontaneous vaginal delivery)    x 3  . Wears glasses     Dyslipidemia Osteoarthritis Ex-smoker Coronary artery disease Thyroid disorder-was apparently on levothyroxine 25 g daily which has subsequently been discontinued . Mitral regurgitation B12 deficiency  hiatal hernia with esophagitis   SURGICAL HISTORY: Past Surgical History:  Procedure Laterality Date  . BALLOON DILATION N/A 07/23/2018   Procedure: BALLOON DILATION;  Surgeon: Rush Landmark Telford Nab., MD;  Location: [place];  Service: Gastroenterology;  Laterality: N/A;  . BILIARY BRUSHING  08/10/2018   Procedure: BILIARY BRUSHING;  Surgeon: Rush Landmark Telford Nab., MD;  Location: [place];  Service: Gastroenterology;;  . BILIARY BRUSHING  11/30/2018   Procedure: BILIARY BRUSHING;  Surgeon: Irving Copas., MD;  Location: [place];  Service: Gastroenterology;;  . BILIARY DILATION  08/10/2018   Procedure: BILIARY DILATION;  Surgeon: Irving Copas., MD;  Location: [place];  Service: Gastroenterology;;  . BILIARY DILATION  08/27/2018   Procedure: BILIARY DILATION;  Surgeon: Irving Copas., MD;  Location: [place];  Service: Gastroenterology;;  . BILIARY STENT PLACEMENT  08/10/2018   Procedure: BILIARY STENT PLACEMENT;  Surgeon: Irving Copas., MD;  Location: [place];  Service: Gastroenterology;;  . BILIARY STENT PLACEMENT  08/27/2018   Procedure: BILIARY STENT PLACEMENT;  Surgeon: Irving Copas., MD;  Location: [place];  Service: Gastroenterology;;  . BILIARY STENT PLACEMENT  11/30/2018   Procedure: BILIARY STENT PLACEMENT;  Surgeon: Irving Copas., MD;  Location: [facility]  ENDOSCOPY;  Service: Gastroenterology;;  . BIOPSY  07/23/2018   Procedure: BIOPSY;  Surgeon: Irving Copas., MD;  Location: [place];  Service: Gastroenterology;;  . CHOLECYSTECTOMY N/A 09/02/2018   Procedure: LAPAROSCOPIC CHOLECYSTECTOMY;  Surgeon: Stark Klein, MD;  Location: [place];  Service: General;  Laterality: N/A;  . COLONOSCOPY      normal   . ENDOSCOPIC RETROGRADE CHOLANGIOPANCREATOGRAPHY (ERCP) WITH PROPOFOL N/A 08/10/2018   Procedure: ENDOSCOPIC RETROGRADE CHOLANGIOPANCREATOGRAPHY (ERCP) WITH PROPOFOL;  Surgeon: Irving Copas., MD;  Location: [place];  Service: Gastroenterology;  Laterality: N/A;  . ENDOSCOPIC RETROGRADE CHOLANGIOPANCREATOGRAPHY (ERCP) WITH PROPOFOL N/A 11/30/2018   Procedure: ENDOSCOPIC RETROGRADE CHOLANGIOPANCREATOGRAPHY (ERCP) WITH PROPOFOL;  Surgeon: Rush Landmark Telford Nab., MD;  Location: [place];  Service: Gastroenterology;  Laterality: N/A;  . ERCP N/A 07/23/2018   Procedure: ENDOSCOPIC RETROGRADE CHOLANGIOPANCREATOGRAPHY (ERCP);  Surgeon: Irving Copas., MD;  Location: [place];  Service: Gastroenterology;  Laterality: N/A;  . ERCP N/A 08/27/2018   Procedure: ENDOSCOPIC RETROGRADE CHOLANGIOPANCREATOGRAPHY (ERCP);  Surgeon: Irving Copas., MD;  Location: [place];  Service: Gastroenterology;  Laterality: N/A;  . ESOPHAGOGASTRODUODENOSCOPY (EGD) WITH PROPOFOL N/A 08/27/2018   Procedure: ESOPHAGOGASTRODUODENOSCOPY (EGD) WITH PROPOFOL;  Surgeon: Rush Landmark Telford Nab., MD;  Location: [place];  Service: Gastroenterology;  Laterality: N/A;  . EUS N/A 08/27/2018   Procedure: ESOPHAGEAL ENDOSCOPIC ULTRASOUND (EUS) RADIAL;  Surgeon: Rush Landmark Telford Nab., MD;  Location: [place];  Service: Gastroenterology;  Laterality: N/A;  . FINE NEEDLE ASPIRATION  08/27/2018   Procedure: FINE NEEDLE ASPIRATION (FNA) LINEAR;  Surgeon: Irving Copas., MD;  Location: [place];  Service: Gastroenterology;;  . IR IMAGING GUIDED PORT INSERTION  01/08/2018  . LAPAROSCOPIC NEPHRECTOMY Left 08/01/2016   Procedure: LAPAROSCOPIC  RADICAL NEPHRECTOMY/ REPAIR OF UMBILICAL HERNIA;  Surgeon: Raynelle Bring, MD;  Location: [facility];  Service: Urology;  Laterality: Left;  . REMOVAL OF STONES  07/23/2018   Procedure: REMOVAL OF GALL STONES;  Surgeon: Rush Landmark Telford Nab., MD;   Location: [place];  Service: Gastroenterology;;  . REMOVAL OF STONES  08/10/2018   Procedure: REMOVAL OF STONES;  Surgeon: Irving Copas., MD;  Location: [place];  Service: Gastroenterology;;  . REMOVAL OF STONES  08/27/2018   Procedure: REMOVAL OF STONES;  Surgeon: Irving Copas., MD;  Location: [place];  Service: Gastroenterology;;  . [name]  07/23/2018   Procedure: [name];  Surgeon: Irving Copas., MD;  Location: [place];  Service: Gastroenterology;;  . Lavell Islam REMOVAL  08/27/2018   Procedure: STENT REMOVAL;  Surgeon: Irving Copas., MD;  Location: [place];  Service: Gastroenterology;;  . Lavell Islam REMOVAL  11/30/2018   Procedure: STENT REMOVAL;  Surgeon: Irving Copas., MD;  Location: [place];  Service: Gastroenterology;;  . UPPER GI ENDOSCOPY     x 1   No reported past surgeries EGD 05/01/2016 Dr. Earley Brooke Colonoscopy 03/2016 Dr. Earley Brooke  SOCIAL HISTORY: Social History   Socioeconomic History  . Marital status: Married    Spouse name: Not on file  . Number of children: 3  . Years of education: Not on file  . Highest education level: Not on file  Occupational History  . Occupation: retired  Tobacco Use  . Smoking status: Former Smoker    Packs/day: 1.00    Years: 8.00    Pack years: 8.00    Types: Cigarettes    Quit date: 06/18/1964    Years since quitting: 54.8  . Smokeless tobacco: Never Used  Substance and Sexual Activity  . Alcohol use:  No  . Drug use: No  . Sexual activity: Not on file  Other Topics Concern  . Not on file  Social History Narrative   Married.  Three children   Social Determinants of Health   Financial Resource Strain:   . Difficulty of Paying Living Expenses: Not on file  Food Insecurity:   . Worried About Charity fundraiser in the Last Year: Not on file  . Ran Out of Food in the Last Year: Not on file  Transportation Needs:   . Lack of Transportation (Medical): Not  on file  . Lack of Transportation (Non-Medical): Not on file  Physical Activity:   . Days of Exercise per Week: Not on file  . Minutes of Exercise per Session: Not on file  Stress:   . Feeling of Stress : Not on file  Social Connections:   . Frequency of Communication with Friends and Family: Not on file  . Frequency of Social Gatherings with Friends and Family: Not on file  . Attends Religious Services: Not on file  . Active Member of Clubs or Organizations: Not on file  . Attends Archivist Meetings: Not on file  . Marital Status: Not on file  Intimate Partner Violence:   . Fear of Current or Ex-Partner: Not on file  . Emotionally Abused: Not on file  . Physically Abused: Not on file  . Sexually Abused: Not on file   Patient lives in [place] Works as a Solicitor part-time Ex-smoker quit long time ago In 1961 . Smoked 2 packs per day for 8 years prior to that .  or a lot of stress since her daughter is also getting treated for stage IV uterine cancer.  FAMILY HISTORY:  Mother deceased Father with asthma and emphysema died at 36 years with the MI. Brother deceased of heart disease   ALLERGIES:  is allergic to adhesive [tape]; gabapentin; nsaids; and statins.  MEDICATIONS:  Current Outpatient Medications  Medication Sig Dispense Refill  . amLODipine (NORVASC) 5 MG tablet Take 5 mg by mouth daily.   6  . atorvastatin (LIPITOR) 40 MG tablet Take 40 mg by mouth at bedtime.     . carvedilol (COREG) 6.25 MG tablet Take 6.25 mg by mouth 2 (two) times daily.  6  . clobetasol ointment (TEMOVATE) [id number] % Apply 1 application topically 2 (two) times daily as needed (lichen sclerosis).     . Denosumab (XGEVA [place]) Inject into the skin every 6 (six) weeks.    . hydrocortisone (CORTEF) 10 MG tablet Take 5-10 mg by mouth See admin instructions. Take 1 tablet (10 mg) by mouth in the morning & 1/2 tablet (5 mg) by mouth in the evening.    Marland Kitchen levothyroxine (SYNTHROID)  75 MCG tablet Take 75 mcg by mouth daily before breakfast.     . lidocaine (LIDODERM) 5 % Place 1 patch onto the skin daily. Remove & Discard patch within 12 hours or as directed by MD Using on Right Hip 30 patch 0  . lidocaine-prilocaine (EMLA) cream Apply 1 application topically daily as needed (prior to port access). 30 g 1  . Nivolumab (OPDIVO IV) Inject 1 Dose into the vein every 14 (fourteen) days. [place]    . omeprazole (PRILOSEC) 20 MG capsule Take 1 capsule (20 mg total) by mouth daily. 60 capsule 0  . repaglinide (PRANDIN) 0.5 MG tablet Take 1 mg by mouth 2 (two) times daily before a meal.  No current facility-administered medications for this visit.   Facility-Administered Medications Ordered in Other Visits  Medication Dose Route Frequency Provider Last Rate Last Admin  . sodium chloride flush (NS) 0.9 % injection 10 mL  10 mL Intracatheter PRN Truitt Merle, MD   10 mL at 11/20/18 1232    REVIEW OF SYSTEMS:   A 10+ POINT REVIEW OF SYSTEMS WAS OBTAINED including neurology, dermatology, psychiatry, cardiac, respiratory, lymph, extremities, GI, GU, Musculoskeletal, constitutional, breasts, reproductive, HEENT.  All pertinent positives are noted in the HPI.  All others are negative.   PHYSICAL EXAMINATION:  ECOG FS:2 - Symptomatic, <50% confined to bed  There were no vitals filed for this visit. Wt Readings from Last 3 Encounters:  03/26/19 131 lb 8 oz (59.6 kg)  03/12/19 129 lb (58.5 kg)  02/12/19 129 lb 9.6 oz (58.8 kg)   Body mass index is 27.35 kg/m.    GENERAL:alert, in no acute distress and comfortable SKIN: no acute rashes, no significant lesions EYES: conjunctiva are pink and non-injected, sclera anicteric OROPHARYNX: MMM, no exudates, no oropharyngeal erythema or ulceration NECK: supple, no JVD LYMPH:  no palpable lymphadenopathy in the cervical, axillary or inguinal regions LUNGS: clear to auscultation b/l with normal respiratory effort HEART: regular  rate & rhythm ABDOMEN:  normoactive bowel sounds , non tender, not distended. No palpable hepatosplenomegaly.  Extremity: no pedal edema PSYCH: alert & oriented x 3 with fluent speech NEURO: no focal motor/sensory deficits  LABORATORY DATA:  I have reviewed the data as listed  . CBC Latest Ref Rng & Units 04/28/2019 04/09/2019 03/26/2019  WBC 4.0 - 10.5 K/uL 7.3 7.1 8.5  Hemoglobin 12.0 - 15.0 g/dL 11.7(L) 11.8(L) 12.1  Hematocrit 36.0 - 46.0 % 36.4 37.1 38.9  Platelets 150 - 400 K/uL 242 254 283    CBC    Component Value Date/Time   WBC 7.3 04/28/2019 0740   RBC 4.06 04/28/2019 0740   HGB 11.7 (L) 04/28/2019 0740   HGB 11.4 (L) 09/11/2018 0900   HGB 11.2 (L) 04/04/2017 0830   HCT 36.4 04/28/2019 0740   HCT 35.2 04/04/2017 0830   PLT 242 04/28/2019 0740   PLT 277 09/11/2018 0900   PLT 250 04/04/2017 0830   MCV 89.7 04/28/2019 0740   MCV 107.6 (H) 04/04/2017 0830   MCH 28.8 04/28/2019 0740   MCHC 32.1 04/28/2019 0740   RDW 14.3 04/28/2019 0740   RDW 14.0 04/04/2017 0830   LYMPHSABS 1.5 04/28/2019 0740   LYMPHSABS 1.6 04/04/2017 0830   MONOABS 0.9 04/28/2019 0740   MONOABS 0.4 04/04/2017 0830   EOSABS 0.1 04/28/2019 0740   EOSABS 0.1 04/04/2017 0830   BASOSABS 0.1 04/28/2019 0740   BASOSABS 0.0 04/04/2017 0830     . CMP Latest Ref Rng & Units 04/28/2019 04/09/2019 03/26/2019  Glucose 70 - 99 mg/dL 175(H) 125(H) 153(H)  BUN 8 - 23 mg/dL 23 20 29(H)  Creatinine 0.44 - 1.00 mg/dL 1.46(H) 1.39(H) 1.56(H)  Sodium 135 - 145 mmol/L 137 141 136  Potassium 3.5 - 5.1 mmol/L 4.2 4.1 4.8  Chloride 98 - 111 mmol/L 105 108 104  CO2 22 - 32 mmol/L 24 23 23   Calcium 8.9 - 10.3 mg/dL 8.8(L) 9.5 9.5  Total Protein 6.5 - 8.1 g/dL 6.3(L) 6.4(L) 6.7  Total Bilirubin 0.3 - 1.2 mg/dL 0.3 0.4 0.5  Alkaline Phos 38 - 126 U/L 86 87 86  AST 15 - 41 U/L 11(L) 16 13(L)  ALT 0 - 44 U/L 59(H) 95(H)  44   B12 level 299 (OSH)-->455  . Lab Results  Component Value Date   LDH 136 03/13/2018         08/27/18 Surgical Pathology from Cholecystectomy:         RADIOGRAPHIC STUDIES: I have personally reviewed the radiological images as listed and agreed with the findings in the report. Nm Pet Image Restag (ps) Skull Base To Thigh  Result Date: 01/03/2017 IMPRESSION: 1. There is a new lesion in the hepatic dome which is FDG avid and low in attenuation on CT imaging consistent with metastatic disease. Another region of uptake in the left hepatic lobe posteriorly demonstrates no CT correlate. A second subtle metastasis is not excluded on today's study. An MRI could better assess for other hepatic metastases if clinically warranted. 2. New metastatic lesion in the left side of T8. 3. New pulmonary nodule in the right upper lobe. This is too small to characterize but suspicious. Recommend attention on follow-up. No FDG avid nodules or other enlarging nodules. 4. The uptake at the previous left renal artery has almost resolved in the interval and is favored to be post therapeutic. Recommend attention on follow-up. 5. The metastasis in the posterior right hilum seen previously has resolved. Electronically Signed   By: Dorise Bullion III M.D   On: 01/03/2017 11:16   .No results found.  ASSESSMENT & PLAN:    79 y.o. 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 PET/CT 06/19/2017 -- consistent with partial metabolic response to treatment.  Rt adrenal gland bx - showed clear cell RCC  S/p CYtoreductive left radical nephrectomy and left adrenal gland resection on 08/01/2016 by Dr Alinda Money.  10/16/17 PET/CT revealed Continued improvement, with the T7 metastatic lesion no longer significantly hypermetabolic. The previous right lower lobe pulmonary nodule is even less apparent, perhaps about 2 mm in diameter today; given that this measured 6 mm on 03/28/2017 this  probably represents an effectively treated metastatic lesion. Currently no appreciable hypermetabolic activity is identified to suggest active malignancy. Distended gallbladder with gallbladder wall thickening and gallstones. Correlate clinically in assessing for cholecystitis. Small but abnormal amount of free pelvic fluid, nonspecific. Other imaging findings of potential clinical significance: Chronic ethmoid sinusitis. Aortic Atherosclerosis. Stable 5 mm right middle lobe pulmonary nodule, not hypermetabolic but below sensitive PET-CT size thresholds. Left nephrectomy. Notable pelvic floor laxity with cystocele. Chronic bilateral Sacroiliitis.  04/03/18 PET/CT revealed No evidence for new or progressive hypermetabolic disease on today's study to suggest metastatic progression. 2. Stable appearance of the T7 metastatic lesion without Hypermetabolism. 3. Tiny focus of FDG accumulation identified along the skin of the low right inguinal fold. No associated lesion evident on CT. This may be related to urinary contaminant. 4. Cholelithiasis with similar appearance of diffuse gallbladder wall thickening. 5. Stable 5 mm right middle lobe pulmonary nodule. 6.  Aortic Atherosclerois. 7. Diffuse colonic diverticulosis.   09/18/18 CT A/P revealed "Common duct stent in place. Improvement to resolution in biliary duct dilatation compared to 08/29/2018. Interval cholecystectomy without acute complication. 2. Left nephrectomy, without evidence of metastatic disease. 3.  Tiny hiatal hernia. 4. Coronary artery atherosclerosis. Aortic Atherosclerosis. 5. Mild limitations secondary to lack of IV contrast. 6. Marked pelvic floor laxity with cystocele and rectal prolapse".  #2 b/l adrenal metastases from [place] s/p left adrenalectomy with adrenal insuff - follows with Dr Buddy Duty.  #3 Small pulmonary lesions --  10/16/17 PET/CT showed pulmonary less apparent than 03/28/17 PET/CT, decreasing from 54mm to 73mm diameter.   MRI brain shows  no evidence of metastatic disease  #4 T7/8 Bone metastases - received Xgeva every 4 weeks from May 2018 to June 2019. 10/16/17 PET/CT revealed improvement with T7 metastatic lesion no longer significantly hypermetabolic.  -on Marchelle Folks  #5 ?liver mets- rpt PET/CT from 10/16/2017 shows no overt evidence of metastatic disease in the liver.  #6 Grade 1 Nausea - improved and intermittent. Hasnt used her anti-emetic as instructed so some nausea and decreased po intake.  #7 Grade 1 Diarrhea - resolved  #8 Hyponatremia -  resolved with sodium at 136 - likely related to some element of adrenal insufficiency, diarrhea, limited by mouth intake. Primarily solute free fluid intake.   #9 Acute on chronic renal insufficiency Creatine stable 1.56  #10 Hyperkalemia due to ACEI + RF- resolved  #11 Hemorrhoids -chronic with some bleeding --Recommended Sitz bath and OTC Anusol or Nupercaine for her hemorrhoid relief. -f/u with PCP for continued mx   #12 Moderate protein calorie malnutrition Weight has stabilized and improved Wt Readings from Last 3 Encounters:  03/26/19 131 lb 8 oz (59.6 kg)  03/12/19 129 lb (58.5 kg)  02/12/19 129 lb 9.6 oz (58.8 kg)   Plan: Continue healthy po intake/diabetic diet -Previously recommended the patient to drink atleast 48-64 oz of fluids daily -f/u with PCP/Cardiology for diuretics management  #13 Hypothyroidism/Adrenal insufficiency/Diabetes -Continue being followed by Dr. Buddy Duty  #14 HTN - ?control. Patient tends to be anxious and has higher blood pressures in the clinic. She can have increased blood pressure from Sutent as well. Plan: -continued close f/u with her PCP /cardiology regarding the many elements necessary for her care that are not directly related to her oncology care. -ACEI held due to AKI and hyperkalemia - following with cardiology to mx this. Has been started on Amlodipine instead.  #15 Grade 1 mucositis- resolved -Advised the patient to  continue use of Magic Mouthwash to aid with nutrition  #16 Newly diagnosed ?CAD - positive cardiac nuclear stress test Following with cardiology and nephrology ? Need for pre-operative cardiac cath  #17 Ch/o holedocholithiasis and cholelithiasis 07/10/18 US Abdomen revealed Biliary duct dilatation with 8 mm calculus at the level of the ampulla in the distal common bile duct. 2. Cholelithiasis. No gallbladder wall thickening or pericholecystic fluid. 3. Appearance of the liver raises concern for underlying hepatic cirrhosis. No focal liver lesions are demonstrable. 4.  Left kidney absent. 5.  Small cysts in right kidney. 6.  Aortic Atherosclerosis.  S/p ERCP on 07/23/18 with Dr. Valarie Merino Mansouraty  09/02/18 Cytopathology from ERCP was suspicious for malignant cells in bile duct, but was not definitive, other two findings were benign.  PLAN:  -Discussed pt labwork today, 04/28/19; blood counts and chemistries are steady -No lab or clinical evidence of RCC progression at this time. -No prohibitive toxicities from Nivolumab at this time. -Continue Nivolumab q2weeks  -Discussed with [facility], pt will receive treatment today. Pt advised to watch out for COVID symptoms.  -Advised pt to take precautions to avoid exposure to COVID. Pt stated she would not be interested in a COVID Vaccine when made available  -Will see back in 1 month with labs  FOLLOW UP: Continue Nivolumab q2weeks with port flush and labs please schedule next 4 doses Next MD visit in 4 weeks   The total time spent in the appt was 15 minutes and more than 50% was  on counseling and direct patient cares.  All of the patient's questions were answered with apparent satisfaction. The patient knows to call the clinic with any problems, questions or concerns.  Sullivan Lone MD [place] AAHIVMS [facility] Hematology/Oncology Physician [facility]  (Office):       [phone] (Work cell):  [phone] (Fax):            [phone]   I, Yevette Edwards, am acting as a scribe for Dr. Sullivan Lone.   .I have reviewed the above documentation for accuracy and completeness, and I agree with the above. Brunetta Genera MD

## 2019-04-28 ENCOUNTER — Inpatient Hospital Stay: Payer: Medicare Other

## 2019-04-28 ENCOUNTER — Inpatient Hospital Stay (HOSPITAL_BASED_OUTPATIENT_CLINIC_OR_DEPARTMENT_OTHER): Payer: Medicare Other | Admitting: Hematology

## 2019-04-28 ENCOUNTER — Inpatient Hospital Stay: Payer: Medicare Other | Attending: Hematology

## 2019-04-28 ENCOUNTER — Other Ambulatory Visit: Payer: Self-pay

## 2019-04-28 VITALS — BP 161/69 | HR 71 | Temp 98.3°F | Resp 17 | Ht 59.0 in | Wt 135.4 lb

## 2019-04-28 DIAGNOSIS — E875 Hyperkalemia: Secondary | ICD-10-CM | POA: Diagnosis not present

## 2019-04-28 DIAGNOSIS — Z7189 Other specified counseling: Secondary | ICD-10-CM

## 2019-04-28 DIAGNOSIS — Z87891 Personal history of nicotine dependence: Secondary | ICD-10-CM | POA: Insufficient documentation

## 2019-04-28 DIAGNOSIS — Z79899 Other long term (current) drug therapy: Secondary | ICD-10-CM | POA: Diagnosis not present

## 2019-04-28 DIAGNOSIS — M129 Arthropathy, unspecified: Secondary | ICD-10-CM | POA: Insufficient documentation

## 2019-04-28 DIAGNOSIS — K8020 Calculus of gallbladder without cholecystitis without obstruction: Secondary | ICD-10-CM | POA: Insufficient documentation

## 2019-04-28 DIAGNOSIS — I129 Hypertensive chronic kidney disease with stage 1 through stage 4 chronic kidney disease, or unspecified chronic kidney disease: Secondary | ICD-10-CM | POA: Insufficient documentation

## 2019-04-28 DIAGNOSIS — I2510 Atherosclerotic heart disease of native coronary artery without angina pectoris: Secondary | ICD-10-CM | POA: Diagnosis not present

## 2019-04-28 DIAGNOSIS — R7401 Elevation of levels of liver transaminase levels: Secondary | ICD-10-CM | POA: Diagnosis not present

## 2019-04-28 DIAGNOSIS — N189 Chronic kidney disease, unspecified: Secondary | ICD-10-CM | POA: Diagnosis not present

## 2019-04-28 DIAGNOSIS — K219 Gastro-esophageal reflux disease without esophagitis: Secondary | ICD-10-CM | POA: Insufficient documentation

## 2019-04-28 DIAGNOSIS — E785 Hyperlipidemia, unspecified: Secondary | ICD-10-CM | POA: Diagnosis not present

## 2019-04-28 DIAGNOSIS — E871 Hypo-osmolality and hyponatremia: Secondary | ICD-10-CM | POA: Insufficient documentation

## 2019-04-28 DIAGNOSIS — T464X5A Adverse effect of angiotensin-converting-enzyme inhibitors, initial encounter: Secondary | ICD-10-CM | POA: Diagnosis not present

## 2019-04-28 DIAGNOSIS — E1122 Type 2 diabetes mellitus with diabetic chronic kidney disease: Secondary | ICD-10-CM | POA: Diagnosis not present

## 2019-04-28 DIAGNOSIS — Z5112 Encounter for antineoplastic immunotherapy: Secondary | ICD-10-CM | POA: Diagnosis not present

## 2019-04-28 DIAGNOSIS — E039 Hypothyroidism, unspecified: Secondary | ICD-10-CM | POA: Diagnosis not present

## 2019-04-28 DIAGNOSIS — E1136 Type 2 diabetes mellitus with diabetic cataract: Secondary | ICD-10-CM | POA: Insufficient documentation

## 2019-04-28 DIAGNOSIS — C642 Malignant neoplasm of left kidney, except renal pelvis: Secondary | ICD-10-CM | POA: Diagnosis not present

## 2019-04-28 DIAGNOSIS — K649 Unspecified hemorrhoids: Secondary | ICD-10-CM | POA: Diagnosis not present

## 2019-04-28 DIAGNOSIS — C7951 Secondary malignant neoplasm of bone: Secondary | ICD-10-CM | POA: Insufficient documentation

## 2019-04-28 DIAGNOSIS — I252 Old myocardial infarction: Secondary | ICD-10-CM | POA: Insufficient documentation

## 2019-04-28 DIAGNOSIS — M1990 Unspecified osteoarthritis, unspecified site: Secondary | ICD-10-CM | POA: Insufficient documentation

## 2019-04-28 DIAGNOSIS — C7800 Secondary malignant neoplasm of unspecified lung: Secondary | ICD-10-CM | POA: Diagnosis not present

## 2019-04-28 DIAGNOSIS — E440 Moderate protein-calorie malnutrition: Secondary | ICD-10-CM | POA: Insufficient documentation

## 2019-04-28 DIAGNOSIS — N8110 Cystocele, unspecified: Secondary | ICD-10-CM | POA: Insufficient documentation

## 2019-04-28 DIAGNOSIS — Z95828 Presence of other vascular implants and grafts: Secondary | ICD-10-CM

## 2019-04-28 DIAGNOSIS — I700 Atherosclerosis of aorta: Secondary | ICD-10-CM | POA: Insufficient documentation

## 2019-04-28 DIAGNOSIS — M461 Sacroiliitis, not elsewhere classified: Secondary | ICD-10-CM | POA: Insufficient documentation

## 2019-04-28 DIAGNOSIS — K5730 Diverticulosis of large intestine without perforation or abscess without bleeding: Secondary | ICD-10-CM | POA: Insufficient documentation

## 2019-04-28 DIAGNOSIS — D649 Anemia, unspecified: Secondary | ICD-10-CM | POA: Insufficient documentation

## 2019-04-28 LAB — CMP (CANCER CENTER ONLY)
ALT: 59 U/L — ABNORMAL HIGH (ref 0–44)
AST: 11 U/L — ABNORMAL LOW (ref 15–41)
Albumin: 3.4 g/dL — ABNORMAL LOW (ref 3.5–5.0)
Alkaline Phosphatase: 86 U/L (ref 38–126)
Anion gap: 8 (ref 5–15)
BUN: 23 mg/dL (ref 8–23)
CO2: 24 mmol/L (ref 22–32)
Calcium: 8.8 mg/dL — ABNORMAL LOW (ref 8.9–10.3)
Chloride: 105 mmol/L (ref 98–111)
Creatinine: 1.46 mg/dL — ABNORMAL HIGH (ref 0.44–1.00)
GFR, Est AFR Am: 40 mL/min — ABNORMAL LOW (ref >60)
GFR, Estimated: 34 mL/min — ABNORMAL LOW (ref >60)
Glucose, Bld: 175 mg/dL — ABNORMAL HIGH (ref 70–99)
Potassium: 4.2 mmol/L (ref 3.5–5.1)
Sodium: 137 mmol/L (ref 135–145)
Total Bilirubin: 0.3 mg/dL (ref 0.3–1.2)
Total Protein: 6.3 g/dL — ABNORMAL LOW (ref 6.5–8.1)

## 2019-04-28 LAB — CBC WITH DIFFERENTIAL/PLATELET
Abs Immature Granulocytes: 0.14 10*3/uL — ABNORMAL HIGH (ref 0.00–0.07)
Basophils Absolute: 0.1 10*3/uL (ref 0.0–0.1)
Basophils Relative: 1 %
Eosinophils Absolute: 0.1 10*3/uL (ref 0.0–0.5)
Eosinophils Relative: 2 %
HCT: 36.4 % (ref 36.0–46.0)
Hemoglobin: 11.7 g/dL — ABNORMAL LOW (ref 12.0–15.0)
Immature Granulocytes: 2 %
Lymphocytes Relative: 20 %
Lymphs Abs: 1.5 10*3/uL (ref 0.7–4.0)
MCH: 28.8 pg (ref 26.0–34.0)
MCHC: 32.1 g/dL (ref 30.0–36.0)
MCV: 89.7 fL (ref 80.0–100.0)
Monocytes Absolute: 0.9 10*3/uL (ref 0.1–1.0)
Monocytes Relative: 12 %
Neutro Abs: 4.6 10*3/uL (ref 1.7–7.7)
Neutrophils Relative %: 63 %
Platelets: 242 10*3/uL (ref 150–400)
RBC: 4.06 MIL/uL (ref 3.87–5.11)
RDW: 14.3 % (ref 11.5–15.5)
WBC: 7.3 10*3/uL (ref 4.0–10.5)
nRBC: 0 % (ref 0.0–0.2)

## 2019-04-28 MED ORDER — SODIUM CHLORIDE 0.9 % IV SOLN
240.0000 mg | Freq: Once | INTRAVENOUS | Status: AC
  Administered 2019-04-28: 240 mg via INTRAVENOUS
  Filled 2019-04-28: qty 24

## 2019-04-28 MED ORDER — SODIUM CHLORIDE 0.9% FLUSH
10.0000 mL | INTRAVENOUS | Status: DC | PRN
  Administered 2019-04-28: 10 mL
  Filled 2019-04-28: qty 10

## 2019-04-28 MED ORDER — SODIUM CHLORIDE 0.9% FLUSH
10.0000 mL | Freq: Once | INTRAVENOUS | Status: AC
  Administered 2019-04-28: 10 mL
  Filled 2019-04-28: qty 10

## 2019-04-28 MED ORDER — SODIUM CHLORIDE 0.9 % IV SOLN
Freq: Once | INTRAVENOUS | Status: AC
  Filled 2019-04-28: qty 250

## 2019-04-28 MED ORDER — HEPARIN SOD (PORK) LOCK FLUSH 100 UNIT/ML IV SOLN
500.0000 [IU] | Freq: Once | INTRAVENOUS | Status: AC | PRN
  Administered 2019-04-28: 500 [IU]
  Filled 2019-04-28: qty 5

## 2019-04-28 NOTE — Patient Instructions (Signed)
[facility] Discharge Instructions for Patients Receiving Chemotherapy  Today you received the following immunotherapy agents:  Opdivo   To help prevent nausea and vomiting after your treatment, we encourage you to take your nausea medication as needed.   If you develop nausea and vomiting that is not controlled by your nausea medication, call the clinic.   BELOW ARE SYMPTOMS THAT SHOULD BE REPORTED IMMEDIATELY:  *FEVER GREATER THAN 100.5 F  *CHILLS WITH OR WITHOUT FEVER  NAUSEA AND VOMITING THAT IS NOT CONTROLLED WITH YOUR NAUSEA MEDICATION  *UNUSUAL SHORTNESS OF BREATH  *UNUSUAL BRUISING OR BLEEDING  TENDERNESS IN MOUTH AND THROAT WITH OR WITHOUT PRESENCE OF ULCERS  *URINARY PROBLEMS  *BOWEL PROBLEMS  UNUSUAL RASH Items with * indicate a potential emergency and should be followed up as soon as possible.  Feel free to call the clinic should you have any questions or concerns. The clinic phone number is [phone].  Please show the CHEMO ALERT CARD at check-in to the Emergency Department and triage nurse.   

## 2019-04-29 ENCOUNTER — Telehealth: Payer: Self-pay | Admitting: Hematology

## 2019-04-29 NOTE — Telephone Encounter (Signed)
Scheduled appt per 1/6 los,  Spoke with pt and she is aware of the appt date and time.

## 2019-05-05 ENCOUNTER — Other Ambulatory Visit: Payer: Self-pay | Admitting: Gastroenterology

## 2019-05-06 ENCOUNTER — Other Ambulatory Visit: Payer: Self-pay

## 2019-05-06 ENCOUNTER — Encounter (INDEPENDENT_AMBULATORY_CARE_PROVIDER_SITE_OTHER): Payer: Medicare Other | Admitting: Diagnostic Neuroimaging

## 2019-05-06 ENCOUNTER — Ambulatory Visit (INDEPENDENT_AMBULATORY_CARE_PROVIDER_SITE_OTHER): Payer: Medicare Other | Admitting: Diagnostic Neuroimaging

## 2019-05-06 DIAGNOSIS — R200 Anesthesia of skin: Secondary | ICD-10-CM | POA: Diagnosis not present

## 2019-05-06 DIAGNOSIS — Z0289 Encounter for other administrative examinations: Secondary | ICD-10-CM

## 2019-05-12 ENCOUNTER — Inpatient Hospital Stay: Payer: Medicare Other

## 2019-05-12 ENCOUNTER — Other Ambulatory Visit: Payer: Self-pay

## 2019-05-12 VITALS — BP 145/78 | HR 69 | Temp 97.8°F | Resp 18

## 2019-05-12 DIAGNOSIS — C642 Malignant neoplasm of left kidney, except renal pelvis: Secondary | ICD-10-CM

## 2019-05-12 DIAGNOSIS — C7951 Secondary malignant neoplasm of bone: Secondary | ICD-10-CM

## 2019-05-12 DIAGNOSIS — Z95828 Presence of other vascular implants and grafts: Secondary | ICD-10-CM

## 2019-05-12 DIAGNOSIS — Z5112 Encounter for antineoplastic immunotherapy: Secondary | ICD-10-CM | POA: Diagnosis not present

## 2019-05-12 DIAGNOSIS — Z7189 Other specified counseling: Secondary | ICD-10-CM

## 2019-05-12 LAB — CBC WITH DIFFERENTIAL/PLATELET
Abs Immature Granulocytes: 0.1 10*3/uL — ABNORMAL HIGH (ref 0.00–0.07)
Basophils Absolute: 0.1 10*3/uL (ref 0.0–0.1)
Basophils Relative: 1 %
Eosinophils Absolute: 0.2 10*3/uL (ref 0.0–0.5)
Eosinophils Relative: 3 %
HCT: 38.4 % (ref 36.0–46.0)
Hemoglobin: 12.5 g/dL (ref 12.0–15.0)
Immature Granulocytes: 1 %
Lymphocytes Relative: 19 %
Lymphs Abs: 1.4 10*3/uL (ref 0.7–4.0)
MCH: 29.3 pg (ref 26.0–34.0)
MCHC: 32.6 g/dL (ref 30.0–36.0)
MCV: 89.9 fL (ref 80.0–100.0)
Monocytes Absolute: 0.8 10*3/uL (ref 0.1–1.0)
Monocytes Relative: 11 %
Neutro Abs: 4.9 10*3/uL (ref 1.7–7.7)
Neutrophils Relative %: 65 %
Platelets: 259 10*3/uL (ref 150–400)
RBC: 4.27 MIL/uL (ref 3.87–5.11)
RDW: 14.4 % (ref 11.5–15.5)
WBC: 7.4 10*3/uL (ref 4.0–10.5)
nRBC: 0 % (ref 0.0–0.2)

## 2019-05-12 LAB — CMP (CANCER CENTER ONLY)
ALT: 13 U/L (ref 0–44)
AST: 14 U/L — ABNORMAL LOW (ref 15–41)
Albumin: 3.6 g/dL (ref 3.5–5.0)
Alkaline Phosphatase: 62 U/L (ref 38–126)
Anion gap: 10 (ref 5–15)
BUN: 31 mg/dL — ABNORMAL HIGH (ref 8–23)
CO2: 20 mmol/L — ABNORMAL LOW (ref 22–32)
Calcium: 8.8 mg/dL — ABNORMAL LOW (ref 8.9–10.3)
Chloride: 107 mmol/L (ref 98–111)
Creatinine: 1.5 mg/dL — ABNORMAL HIGH (ref 0.44–1.00)
GFR, Est AFR Am: 39 mL/min — ABNORMAL LOW (ref >60)
GFR, Estimated: 33 mL/min — ABNORMAL LOW (ref >60)
Glucose, Bld: 173 mg/dL — ABNORMAL HIGH (ref 70–99)
Potassium: 4.5 mmol/L (ref 3.5–5.1)
Sodium: 137 mmol/L (ref 135–145)
Total Bilirubin: 0.4 mg/dL (ref 0.3–1.2)
Total Protein: 6.6 g/dL (ref 6.5–8.1)

## 2019-05-12 MED ORDER — HEPARIN SOD (PORK) LOCK FLUSH 100 UNIT/ML IV SOLN
500.0000 [IU] | Freq: Once | INTRAVENOUS | Status: AC | PRN
  Administered 2019-05-12: 10:00:00 500 [IU]
  Filled 2019-05-12: qty 5

## 2019-05-12 MED ORDER — SODIUM CHLORIDE 0.9% FLUSH
10.0000 mL | Freq: Once | INTRAVENOUS | Status: DC
  Filled 2019-05-12: qty 10

## 2019-05-12 MED ORDER — SODIUM CHLORIDE 0.9 % IV SOLN
Freq: Once | INTRAVENOUS | Status: AC
  Filled 2019-05-12: qty 250

## 2019-05-12 MED ORDER — DENOSUMAB 120 MG/1.7ML ~~LOC~~ SOLN
SUBCUTANEOUS | Status: AC
  Filled 2019-05-12: qty 1.7

## 2019-05-12 MED ORDER — SODIUM CHLORIDE 0.9% FLUSH
10.0000 mL | INTRAVENOUS | Status: DC | PRN
  Administered 2019-05-12: 10 mL
  Filled 2019-05-12: qty 10

## 2019-05-12 MED ORDER — SODIUM CHLORIDE 0.9 % IV SOLN
240.0000 mg | Freq: Once | INTRAVENOUS | Status: AC
  Administered 2019-05-12: 10:00:00 240 mg via INTRAVENOUS
  Filled 2019-05-12: qty 24

## 2019-05-12 MED ORDER — DENOSUMAB 120 MG/1.7ML ~~LOC~~ SOLN
120.0000 mg | Freq: Once | SUBCUTANEOUS | Status: AC
  Administered 2019-05-12: 10:00:00 120 mg via SUBCUTANEOUS

## 2019-05-12 NOTE — Procedures (Signed)
[facility]  NCS (NERVE CONDUCTION STUDY) WITH EMG (ELECTROMYOGRAPHY) REPORT   STUDY DATE: 05/06/19 PATIENT NAME: Audrey [name] DOB: [birth year] MRN: [id number]  ORDERING CLINICIAN: Barbette Hair, MD  TECHNOLOGIST: Sherre Scarlet ELECTROMYOGRAPHER: Earlean Polka. Penumalli, MD  CLINICAL INFORMATION: 79 year old female with right foot / leg numbness.  FINDINGS: NERVE CONDUCTION STUDY:  Left peroneal and left tibial motor responses are normal.  Right peroneal motor response has normal distal latency, normal amplitude, mildly slow conduction velocity.  Right tibial motor response has normal distal latency, decreased amplitude, mildly slow conduction velocity.  Right superficial peroneal response could not be obtained.  Left superficial peroneal and right sural sensory responses have decreased amplitudes.  Left sural sensory response is normal.  Left tibial F-wave latency is normal.  Right tibial F-wave latency is prolonged.   NEEDLE ELECTROMYOGRAPHY:  Needle examination of right lower extremity is normal.  Right lumbar paraspinal muscles have 1+ positive sharp waves at rest.   IMPRESSION:   Abnormal study demonstrating: - Bilateral distal, axonal sensorimotor polyneuropathy. - Mild right lumbar nerve root irritation on needle EMG may be due to superimposed right lumbar radiculopathy.    INTERPRETING PHYSICIAN:  Penni Bombard, MD Certified in Neurology, Neurophysiology and Neuroimaging  [facility] [address], [place], [place] [address] [phone]  [facility]    Nerve / Sites Muscle Latency Ref. Amplitude Ref. Rel Amp Segments Distance Velocity Ref. Area    ms ms mV mV %  cm m/s m/s mVms  R Peroneal - EDB     Ankle EDB 5.5 ?6.5 3.6 ?2.0 100 Ankle - EDB 9   12.5     Fib head EDB 12.0  3.1  85.1 Fib head - Ankle 26 39 ?44 11.5     Pop fossa EDB 14.8  3.0  96.1 Pop fossa - Fib head 10 36 ?44 10.8         Pop fossa - Ankle       L Peroneal - EDB     Ankle EDB 4.7 ?6.5 3.6 ?2.0 100 Ankle - EDB 9   11.3     Fib head EDB 10.8  3.1  85.8 Fib head - Ankle 27 44 ?44 11.3     Pop fossa EDB 13.1  3.2  102 Pop fossa - Fib head 10 44 ?44 11.9         Pop fossa - Ankle      R Tibial - AH     Ankle AH 5.4 ?5.8 0.4 ?4.0 100 Ankle - AH 9   1.8     Pop fossa AH 14.8  0.7  189 Pop fossa - Ankle 34 36 ?41 2.8  L Tibial - AH     Ankle AH 4.7 ?5.8 5.8 ?4.0 100 Ankle - AH 9   14.3     Pop fossa AH 13.0  2.3  39 Pop fossa - Ankle 34 41 ?41 10.5             SNC    Nerve / Sites Rec. Site Peak Lat Ref.  Amp Ref. Segments Distance    ms ms V V  cm  R Sural - Ankle (Calf)     Calf Ankle 4.2 ?4.4 2 ?6 Calf - Ankle 14  L Sural - Ankle (Calf)     Calf Ankle 4.1 ?4.4 6 ?6 Calf - Ankle 14  R Superficial peroneal - Ankle     Lat leg Ankle  NR ?4.4 NR ?6 Lat leg - Ankle 14  L Superficial peroneal - Ankle     Lat leg Ankle 4.4 ?4.4 5 ?6 Lat leg - Ankle 14             F  Wave    Nerve F Lat Ref.   ms ms  R Tibial - AH 62.6 ?56.0  L Tibial - AH 52.7 ?56.0         EMG Summary Table    Spontaneous MUAP Recruitment  Muscle IA Fib PSW Fasc Other Amp Dur. Poly Pattern  R. Vastus medialis Normal None None None _______ Normal Normal Normal Normal  R. Tibialis anterior Normal None None None _______ Increased Normal Normal Reduced  R. Gastrocnemius (Medial head) Normal None None None _______ Normal Normal Normal Normal  R. Lumbar paraspinals Increased None 1+ None _______ Normal Normal Normal Normal

## 2019-05-12 NOTE — Patient Instructions (Signed)
[facility] Discharge Instructions for Patients Receiving Chemotherapy  Today you received the following immunotherapy agents:  Opdivo   To help prevent nausea and vomiting after your treatment, we encourage you to take your nausea medication as needed.   If you develop nausea and vomiting that is not controlled by your nausea medication, call the clinic.   BELOW ARE SYMPTOMS THAT SHOULD BE REPORTED IMMEDIATELY:  *FEVER GREATER THAN 100.5 F  *CHILLS WITH OR WITHOUT FEVER  NAUSEA AND VOMITING THAT IS NOT CONTROLLED WITH YOUR NAUSEA MEDICATION  *UNUSUAL SHORTNESS OF BREATH  *UNUSUAL BRUISING OR BLEEDING  TENDERNESS IN MOUTH AND THROAT WITH OR WITHOUT PRESENCE OF ULCERS  *URINARY PROBLEMS  *BOWEL PROBLEMS  UNUSUAL RASH Items with * indicate a potential emergency and should be followed up as soon as possible.  Feel free to call the clinic should you have any questions or concerns. The clinic phone number is [phone].  Please show the CHEMO ALERT CARD at check-in to the Emergency Department and triage nurse.   

## 2019-05-14 ENCOUNTER — Telehealth: Payer: Self-pay | Admitting: *Deleted

## 2019-05-14 ENCOUNTER — Emergency Department (HOSPITAL_COMMUNITY)
Admission: EM | Admit: 2019-05-14 | Discharge: 2019-05-14 | Disposition: A | Discharge status: Home / Self Care | Payer: Medicare Other | Attending: Emergency Medicine | Admitting: Emergency Medicine

## 2019-05-14 ENCOUNTER — Other Ambulatory Visit: Payer: Self-pay

## 2019-05-14 DIAGNOSIS — I10 Essential (primary) hypertension: Secondary | ICD-10-CM | POA: Diagnosis not present

## 2019-05-14 DIAGNOSIS — Z79899 Other long term (current) drug therapy: Secondary | ICD-10-CM | POA: Insufficient documentation

## 2019-05-14 DIAGNOSIS — R350 Frequency of micturition: Secondary | ICD-10-CM | POA: Diagnosis present

## 2019-05-14 DIAGNOSIS — R300 Dysuria: Secondary | ICD-10-CM | POA: Insufficient documentation

## 2019-05-14 DIAGNOSIS — Z85830 Personal history of malignant neoplasm of bone: Secondary | ICD-10-CM | POA: Insufficient documentation

## 2019-05-14 DIAGNOSIS — Z905 Acquired absence of kidney: Secondary | ICD-10-CM | POA: Insufficient documentation

## 2019-05-14 DIAGNOSIS — I252 Old myocardial infarction: Secondary | ICD-10-CM | POA: Insufficient documentation

## 2019-05-14 DIAGNOSIS — R109 Unspecified abdominal pain: Secondary | ICD-10-CM | POA: Insufficient documentation

## 2019-05-14 DIAGNOSIS — Z87891 Personal history of nicotine dependence: Secondary | ICD-10-CM | POA: Diagnosis not present

## 2019-05-14 DIAGNOSIS — E039 Hypothyroidism, unspecified: Secondary | ICD-10-CM | POA: Insufficient documentation

## 2019-05-14 DIAGNOSIS — N12 Tubulo-interstitial nephritis, not specified as acute or chronic: Secondary | ICD-10-CM | POA: Diagnosis not present

## 2019-05-14 DIAGNOSIS — Z85528 Personal history of other malignant neoplasm of kidney: Secondary | ICD-10-CM | POA: Insufficient documentation

## 2019-05-14 LAB — URINALYSIS, ROUTINE W REFLEX MICROSCOPIC
Bilirubin Urine: NEGATIVE
Glucose, UA: NEGATIVE mg/dL
Ketones, ur: NEGATIVE mg/dL
Nitrite: NEGATIVE
Protein, ur: 100 mg/dL — AB
RBC / HPF: >50 RBC/hpf — ABNORMAL HIGH (ref 0–5)
Specific Gravity, Urine: 1.01 (ref 1.005–1.030)
WBC, UA: >50 WBC/hpf — ABNORMAL HIGH (ref 0–5)
pH: 5 (ref 5.0–8.0)

## 2019-05-14 LAB — COMPREHENSIVE METABOLIC PANEL
ALT: 16 U/L (ref 0–44)
AST: 19 U/L (ref 15–41)
Albumin: 3.9 g/dL (ref 3.5–5.0)
Alkaline Phosphatase: 67 U/L (ref 38–126)
Anion gap: 12 (ref 5–15)
BUN: 33 mg/dL — ABNORMAL HIGH (ref 8–23)
CO2: 20 mmol/L — ABNORMAL LOW (ref 22–32)
Calcium: 9.9 mg/dL (ref 8.9–10.3)
Chloride: 102 mmol/L (ref 98–111)
Creatinine, Ser: 1.84 mg/dL — ABNORMAL HIGH (ref 0.44–1.00)
GFR calc Af Amer: 30 mL/min — ABNORMAL LOW (ref >60)
GFR calc non Af Amer: 26 mL/min — ABNORMAL LOW (ref >60)
Glucose, Bld: 159 mg/dL — ABNORMAL HIGH (ref 70–99)
Potassium: 4.7 mmol/L (ref 3.5–5.1)
Sodium: 134 mmol/L — ABNORMAL LOW (ref 135–145)
Total Bilirubin: 0.1 mg/dL — ABNORMAL LOW (ref 0.3–1.2)
Total Protein: 6.9 g/dL (ref 6.5–8.1)

## 2019-05-14 LAB — CBC
HCT: 43.9 % (ref 36.0–46.0)
Hemoglobin: 14 g/dL (ref 12.0–15.0)
MCH: 29 pg (ref 26.0–34.0)
MCHC: 31.9 g/dL (ref 30.0–36.0)
MCV: 90.9 fL (ref 80.0–100.0)
Platelets: 283 10*3/uL (ref 150–400)
RBC: 4.83 MIL/uL (ref 3.87–5.11)
RDW: 14.4 % (ref 11.5–15.5)
WBC: 13.8 10*3/uL — ABNORMAL HIGH (ref 4.0–10.5)
nRBC: 0 % (ref 0.0–0.2)

## 2019-05-14 MED ORDER — SODIUM CHLORIDE 0.9% FLUSH: 10.0000 mL | INTRAVENOUS | Status: DC | PRN

## 2019-05-14 MED ORDER — SODIUM CHLORIDE 0.9 % IV BOLUS
1000.0000 mL | Freq: Once | INTRAVENOUS | Status: AC
  Administered 2019-05-14: 1000 mL via INTRAVENOUS

## 2019-05-14 MED ORDER — CHLORHEXIDINE GLUCONATE CLOTH 2 % EX PADS: 6.0000 | MEDICATED_PAD | Freq: Every day | CUTANEOUS | Status: DC

## 2019-05-14 MED ORDER — HEPARIN SOD (PORK) LOCK FLUSH 100 UNIT/ML IV SOLN
500.0000 [IU] | INTRAVENOUS | Status: AC | PRN
  Administered 2019-05-14: 500 [IU]
  Filled 2019-05-14: qty 5

## 2019-05-14 MED ORDER — SODIUM CHLORIDE 0.9 % IV SOLN: 1.0000 g | Freq: Once | INTRAVENOUS | Status: DC

## 2019-05-14 MED ORDER — SODIUM CHLORIDE 0.9% FLUSH: 10.0000 mL | Freq: Two times a day (BID) | INTRAVENOUS | Status: DC

## 2019-05-14 MED ORDER — CEPHALEXIN 500 MG PO CAPS: 500.0000 mg | ORAL_CAPSULE | Freq: Four times a day (QID) | ORAL | 0 refills | Status: DC

## 2019-05-14 MED ORDER — SODIUM CHLORIDE 0.9 % IV SOLN
1.0000 g | Freq: Once | INTRAVENOUS | Status: AC
  Administered 2019-05-14: 1 g via INTRAVENOUS
  Filled 2019-05-14: qty 10

## 2019-05-14 MED ORDER — SODIUM CHLORIDE 0.9 % IV BOLUS: 500.0000 mL | Freq: Once | INTRAVENOUS | Status: DC

## 2019-05-14 NOTE — ED Notes (Signed)
Patient verbalizes understanding of discharge instructions. Opportunity for questioning and answers were provided. Armband removed by staff, pt discharged from ED. Pt. ambulatory and discharged home.  

## 2019-05-14 NOTE — Telephone Encounter (Signed)
Patient called and requested copy of most recent labs faxed to [facility]. She is there for symptoms of UTI. Staff gave fax number [phone] via patient phone. Faxed as requested and fax confirmation received.

## 2019-05-14 NOTE — ED Provider Notes (Signed)
[place] EMERGENCY DEPARTMENT Provider Note   CSN: [id number] Arrival date & time: 05/14/19  1553     History No chief complaint on file.   Audrey [name] is a 79 y.o. female hx of HTN, HL, HTN, here with UTI.  Patient states that she has a history of renal cell cancer and has been getting injections most recently was 2 days ago.  She states that she has some frequency and dysuria.  She states that she gets frequent UTIs.  She also has some flank pain as well.  She has labs done at the cancer center earlier today that showed a creatinine of 1.8.  She was sent here for evaluation.  Denies any fevers or chills.  Patient states that she gets multiple UTIs a year.  Her urine culture previously showed multiple species.   The history is provided by the patient.       Past Medical History:  Diagnosis Date  . Anemia   . Arthritis    lower back, hips, hands  . Biliary stricture   . Diabetes mellitus ([place])    type 2 - no meds, diet controlled  . Early cataracts, bilateral   . Elevated liver enzymes   . GERD (gastroesophageal reflux disease)    occasional - diet controlled  . History of hiatal hernia   . HTN (hypertension)   . Hyperlipidemia   . Hypothyroidism   . left renal ca dx'd 2018   renal cancer - left kidney removed, pill chemo x 1 yr  . Myocardial infarction ([place]) 1991   no deficits  . SVD (spontaneous vaginal delivery)    x 3  . Wears glasses     Patient Active Problem List   Diagnosis Date Noted  . Anemia 10/10/2018  . 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 Acute cholecystitis 08/29/2018  . Constipation 08/23/2018  . Abdominal pain 08/23/2018  . 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 Preop cardiovascular exam 08/14/2018  . Coronary artery disease involving native coronary artery of native heart without angina pectoris 08/14/2018  . Dyslipidemia 08/14/2018  . Abnormal LFTs 08/05/2018  . Choledocholithiasis 08/05/2018  . Dilated bile duct 08/05/2018  .  History of ERCP 08/05/2018  . History of renal cell carcinoma 08/05/2018  . Port-A-Cath in place 01/30/2018  . Counseling regarding advance care planning and goals of care 01/04/2018  . Renal cell carcinoma ([place]) 04/11/2017  . 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 Left renal mass 08/01/2016    Past Surgical History:  Procedure Laterality Date  . BALLOON DILATION N/A 07/23/2018   Procedure: BALLOON DILATION;  Surgeon: Rush Landmark Telford Nab., MD;  Location: [place];  Service: Gastroenterology;  Laterality: N/A;  . BILIARY BRUSHING  08/10/2018   Procedure: BILIARY BRUSHING;  Surgeon: Rush Landmark Telford Nab., MD;  Location: [place];  Service: Gastroenterology;;  . BILIARY BRUSHING  11/30/2018   Procedure: BILIARY BRUSHING;  Surgeon: Irving Copas., MD;  Location: [place];  Service: Gastroenterology;;  . BILIARY DILATION  08/10/2018   Procedure: BILIARY DILATION;  Surgeon: Irving Copas., MD;  Location: [place];  Service: Gastroenterology;;  . BILIARY DILATION  08/27/2018   Procedure: BILIARY DILATION;  Surgeon: Irving Copas., MD;  Location: [place];  Service: Gastroenterology;;  . BILIARY STENT PLACEMENT  08/10/2018   Procedure: BILIARY STENT PLACEMENT;  Surgeon: Irving Copas., MD;  Location: [place];  Service: Gastroenterology;;  . BILIARY STENT PLACEMENT  08/27/2018   Procedure: BILIARY STENT PLACEMENT;  Surgeon:  Mansouraty, Telford Nab., MD;  Location: [place];  Service: Gastroenterology;;  . BILIARY STENT PLACEMENT  11/30/2018   Procedure: BILIARY STENT PLACEMENT;  Surgeon: Irving Copas., MD;  Location: [place];  Service: Gastroenterology;;  . BIOPSY  07/23/2018   Procedure: BIOPSY;  Surgeon: Irving Copas., MD;  Location: [place];  Service: Gastroenterology;;  . CHOLECYSTECTOMY N/A 09/02/2018   Procedure: LAPAROSCOPIC CHOLECYSTECTOMY;  Surgeon: Stark Klein, MD;  Location: [place];  Service:  General;  Laterality: N/A;  . COLONOSCOPY     normal   . ENDOSCOPIC RETROGRADE CHOLANGIOPANCREATOGRAPHY (ERCP) WITH PROPOFOL N/A 08/10/2018   Procedure: ENDOSCOPIC RETROGRADE CHOLANGIOPANCREATOGRAPHY (ERCP) WITH PROPOFOL;  Surgeon: Irving Copas., MD;  Location: [place];  Service: Gastroenterology;  Laterality: N/A;  . ENDOSCOPIC RETROGRADE CHOLANGIOPANCREATOGRAPHY (ERCP) WITH PROPOFOL N/A 11/30/2018   Procedure: ENDOSCOPIC RETROGRADE CHOLANGIOPANCREATOGRAPHY (ERCP) WITH PROPOFOL;  Surgeon: Rush Landmark Telford Nab., MD;  Location: [place];  Service: Gastroenterology;  Laterality: N/A;  . ERCP N/A 07/23/2018   Procedure: ENDOSCOPIC RETROGRADE CHOLANGIOPANCREATOGRAPHY (ERCP);  Surgeon: Irving Copas., MD;  Location: [place];  Service: Gastroenterology;  Laterality: N/A;  . ERCP N/A 08/27/2018   Procedure: ENDOSCOPIC RETROGRADE CHOLANGIOPANCREATOGRAPHY (ERCP);  Surgeon: Irving Copas., MD;  Location: [place];  Service: Gastroenterology;  Laterality: N/A;  . ESOPHAGOGASTRODUODENOSCOPY (EGD) WITH PROPOFOL N/A 08/27/2018   Procedure: ESOPHAGOGASTRODUODENOSCOPY (EGD) WITH PROPOFOL;  Surgeon: Rush Landmark Telford Nab., MD;  Location: [place];  Service: Gastroenterology;  Laterality: N/A;  . EUS N/A 08/27/2018   Procedure: ESOPHAGEAL ENDOSCOPIC ULTRASOUND (EUS) RADIAL;  Surgeon: Rush Landmark Telford Nab., MD;  Location: [place];  Service: Gastroenterology;  Laterality: N/A;  . FINE NEEDLE ASPIRATION  08/27/2018   Procedure: FINE NEEDLE ASPIRATION (FNA) LINEAR;  Surgeon: Irving Copas., MD;  Location: [place];  Service: Gastroenterology;;  . IR IMAGING GUIDED PORT INSERTION  01/08/2018  . LAPAROSCOPIC NEPHRECTOMY Left 08/01/2016   Procedure: LAPAROSCOPIC  RADICAL NEPHRECTOMY/ REPAIR OF UMBILICAL HERNIA;  Surgeon: Raynelle Bring, MD;  Location: [facility];  Service: Urology;  Laterality: Left;  . REMOVAL OF STONES  07/23/2018   Procedure: REMOVAL OF GALL STONES;   Surgeon: Rush Landmark Telford Nab., MD;  Location: [place];  Service: Gastroenterology;;  . REMOVAL OF STONES  08/10/2018   Procedure: REMOVAL OF STONES;  Surgeon: Irving Copas., MD;  Location: [place];  Service: Gastroenterology;;  . REMOVAL OF STONES  08/27/2018   Procedure: REMOVAL OF STONES;  Surgeon: Irving Copas., MD;  Location: [place];  Service: Gastroenterology;;  . [name]  07/23/2018   Procedure: [name];  Surgeon: Irving Copas., MD;  Location: [place];  Service: Gastroenterology;;  . Lavell Islam REMOVAL  08/27/2018   Procedure: STENT REMOVAL;  Surgeon: Irving Copas., MD;  Location: [place];  Service: Gastroenterology;;  . Lavell Islam REMOVAL  11/30/2018   Procedure: STENT REMOVAL;  Surgeon: Irving Copas., MD;  Location: [place];  Service: Gastroenterology;;  . UPPER GI ENDOSCOPY     x 1     OB History   No obstetric history on file.     Family History  Problem Relation Age of Onset  . Heart attack Father 10  . Heart disease Brother 54       CABG  . Colon cancer Neg Hx   . Esophageal cancer Neg Hx   . Inflammatory bowel disease Neg Hx   . Liver disease Neg Hx   . Pancreatic cancer Neg Hx   . Rectal cancer Neg Hx   . Stomach  cancer Neg Hx     Social History   Tobacco Use  . Smoking status: Former Smoker    Packs/day: 1.00    Years: 8.00    Pack years: 8.00    Types: Cigarettes    Quit date: 06/18/1964    Years since quitting: 54.9  . Smokeless tobacco: Never Used  Substance Use Topics  . Alcohol use: No  . Drug use: No    Home Medications Prior to Admission medications   Medication Sig Start Date End Date Taking? Authorizing Provider  amLODipine (NORVASC) 5 MG tablet Take 5 mg by mouth daily.  09/01/17   [provider]  atorvastatin (LIPITOR) 40 MG tablet Take 40 mg by mouth at bedtime.     [provider]  carvedilol (COREG) 6.25 MG tablet Take 6.25 mg by mouth 2  (two) times daily. 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     [provider]  Denosumab (XGEVA [place]) Inject into the skin every 6 (six) weeks.    [provider]  hydrocortisone (CORTEF) 10 MG tablet Take 5-10 mg by mouth See admin instructions. Take 1 tablet (10 mg) by mouth in the morning & 1/2 tablet (5 mg) by mouth in the evening.    [provider]  levothyroxine (SYNTHROID) 75 MCG tablet Take 75 mcg by mouth daily before breakfast.     [provider]  lidocaine (LIDODERM) 5 % Place 1 patch onto the skin daily. Remove & Discard patch within 12 hours or as directed by MD Using on Right Hip 12/21/18   Brunetta Genera, MD  lidocaine-prilocaine (EMLA) cream Apply 1 application topically daily as needed (prior to port access). 03/09/19   Brunetta Genera, MD  Nivolumab (OPDIVO IV) Inject 1 Dose into the vein every 14 (fourteen) days. [place]    [provider]  omeprazole (PRILOSEC) 20 MG capsule Take 1 capsule by mouth once daily 05/05/19   Mansouraty, Telford Nab., MD  repaglinide (PRANDIN) 0.5 MG tablet Take 0.5 mg by mouth 2 (two) times daily before a meal.     [provider]    Allergies    Adhesive [tape], Gabapentin, Nsaids, and Statins  Review of Systems   Review of Systems  Genitourinary: Positive for dysuria and frequency.  All other systems reviewed and are negative.   Physical Exam Updated Vital Signs BP 135/71   Pulse 73   Temp 98.5 F (36.9 C) (Oral)   Resp (!) 22   LMP  (LMP Unknown)   SpO2 99%   Physical Exam Vitals and nursing note reviewed.  HENT:     Head: Normocephalic.     Mouth/Throat:     Mouth: Mucous membranes are moist.  Eyes:     Extraocular Movements: Extraocular movements intact.     Pupils: Pupils are equal, round, and reactive to light.  Cardiovascular:     Rate  and Rhythm: Normal rate and regular rhythm.     Pulses: Normal pulses.     Heart sounds: Normal heart sounds.  Pulmonary:     Effort: Pulmonary effort is normal.     Breath sounds: Normal breath sounds.  Abdominal:     Comments: Mild bilateral CVAT   Musculoskeletal:        General: Normal range of motion.     Cervical back: Normal range of motion.  Skin:  General: Skin is warm.     Capillary Refill: Capillary refill takes less than 2 seconds.  Neurological:     General: No focal deficit present.     Mental Status: She is alert and oriented to person, place, and time.  Psychiatric:        Mood and Affect: Mood normal.        Behavior: Behavior normal.     ED Results / Procedures / Treatments   Labs (all labs ordered are listed, but only abnormal results are displayed) Labs Reviewed  CBC - Abnormal; Notable for the following components:      Result Value   WBC 13.8 (*)    All other components within normal limits  COMPREHENSIVE METABOLIC PANEL - Abnormal; Notable for the following components:   Sodium 134 (*)    CO2 20 (*)    Glucose, Bld 159 (*)    BUN 33 (*)    Creatinine, Ser 1.84 (*)    Total Bilirubin 0.1 (*)    GFR calc non Af Amer 26 (*)    GFR calc Af Amer 30 (*)    All other components within normal limits  URINALYSIS, ROUTINE W REFLEX MICROSCOPIC - Abnormal; Notable for the following components:   APPearance TURBID (*)    Hgb urine dipstick LARGE (*)    Protein, ur 100 (*)    Leukocytes,Ua LARGE (*)    RBC / HPF >50 (*)    WBC, UA >50 (*)    Bacteria, UA RARE (*)    All other components within normal limits  URINE CULTURE    EKG None  Radiology No results found.  Procedures Procedures (including critical care time)  Medications Ordered in ED Medications  sodium chloride flush (NS) 0.9 % injection 10-40 mL (has no administration in time range)  sodium chloride flush (NS) 0.9 % injection 10-40 mL (has no administration in time range)    Chlorhexidine Gluconate Cloth 2 % PADS 6 each (has no administration in time range)  cefTRIAXone (ROCEPHIN) 1 g in sodium chloride 0.9 % 100 mL IVPB (1 g Intravenous New Bag/Given 05/14/19 2122)  sodium chloride 0.9 % bolus 1,000 mL (1,000 mLs Intravenous New Bag/Given 05/14/19 2122)    ED Course  I have reviewed the triage vital signs and the nursing notes.  Pertinent labs & imaging results that were available during my care of the patient were reviewed by me and considered in my medical decision making (see chart for details).    MDM Rules/Calculators/A&P                      Audrey [name] is a 79 y.o. female here with dysuria, flank pain. Hx of renal cell cancer on chemo. No fever at home. Has recurrent UTIs. Will get CBC, CMP, UA, urine culture. Will give rocephin empirically.   10:10 PM Labs showed Cr 1.8, baseline is 1.5. UA + UTI. CBC unremarkable. Patient not septic.   Final Clinical Impression(s) / ED Diagnoses Final diagnoses:  None    Rx / DC Orders ED Discharge Orders    None       Drenda Freeze, MD 05/14/19 2225

## 2019-05-14 NOTE — Discharge Instructions (Addendum)
Take keflex four times daily for a week for UTI   See your doctor   Repeat kidney function in a week   Return to ER if you have worse flank pain, vomiting, fever.

## 2019-05-14 NOTE — ED Triage Notes (Signed)
Pt here form her MD office for a possible UtI , pt with c/o urinary frequency and burining , pt is currlty being treated for kidney CA  Creatinine 1.8 at office

## 2019-05-16 LAB — URINE CULTURE

## 2019-05-26 ENCOUNTER — Other Ambulatory Visit: Payer: 59

## 2019-05-26 ENCOUNTER — Ambulatory Visit: Payer: 59 | Admitting: Hematology

## 2019-05-26 ENCOUNTER — Ambulatory Visit: Payer: 59

## 2019-05-27 ENCOUNTER — Inpatient Hospital Stay: Payer: Medicare Other

## 2019-05-27 ENCOUNTER — Other Ambulatory Visit: Payer: Self-pay

## 2019-05-27 ENCOUNTER — Inpatient Hospital Stay: Payer: Medicare Other | Attending: Hematology

## 2019-05-27 ENCOUNTER — Inpatient Hospital Stay (HOSPITAL_BASED_OUTPATIENT_CLINIC_OR_DEPARTMENT_OTHER): Payer: Medicare Other | Admitting: Hematology

## 2019-05-27 VITALS — BP 148/70 | HR 66 | Temp 98.3°F | Resp 18 | Ht 59.0 in | Wt 133.7 lb

## 2019-05-27 DIAGNOSIS — C787 Secondary malignant neoplasm of liver and intrahepatic bile duct: Secondary | ICD-10-CM | POA: Insufficient documentation

## 2019-05-27 DIAGNOSIS — G589 Mononeuropathy, unspecified: Secondary | ICD-10-CM | POA: Diagnosis not present

## 2019-05-27 DIAGNOSIS — C7972 Secondary malignant neoplasm of left adrenal gland: Secondary | ICD-10-CM | POA: Diagnosis not present

## 2019-05-27 DIAGNOSIS — E875 Hyperkalemia: Secondary | ICD-10-CM | POA: Insufficient documentation

## 2019-05-27 DIAGNOSIS — D649 Anemia, unspecified: Secondary | ICD-10-CM | POA: Insufficient documentation

## 2019-05-27 DIAGNOSIS — M129 Arthropathy, unspecified: Secondary | ICD-10-CM | POA: Diagnosis not present

## 2019-05-27 DIAGNOSIS — I252 Old myocardial infarction: Secondary | ICD-10-CM | POA: Insufficient documentation

## 2019-05-27 DIAGNOSIS — E538 Deficiency of other specified B group vitamins: Secondary | ICD-10-CM | POA: Insufficient documentation

## 2019-05-27 DIAGNOSIS — Z5112 Encounter for antineoplastic immunotherapy: Secondary | ICD-10-CM | POA: Diagnosis present

## 2019-05-27 DIAGNOSIS — I340 Nonrheumatic mitral (valve) insufficiency: Secondary | ICD-10-CM | POA: Insufficient documentation

## 2019-05-27 DIAGNOSIS — E871 Hypo-osmolality and hyponatremia: Secondary | ICD-10-CM | POA: Diagnosis not present

## 2019-05-27 DIAGNOSIS — C642 Malignant neoplasm of left kidney, except renal pelvis: Secondary | ICD-10-CM

## 2019-05-27 DIAGNOSIS — R319 Hematuria, unspecified: Secondary | ICD-10-CM | POA: Diagnosis not present

## 2019-05-27 DIAGNOSIS — I700 Atherosclerosis of aorta: Secondary | ICD-10-CM | POA: Insufficient documentation

## 2019-05-27 DIAGNOSIS — Z79899 Other long term (current) drug therapy: Secondary | ICD-10-CM | POA: Insufficient documentation

## 2019-05-27 DIAGNOSIS — E785 Hyperlipidemia, unspecified: Secondary | ICD-10-CM | POA: Insufficient documentation

## 2019-05-27 DIAGNOSIS — E119 Type 2 diabetes mellitus without complications: Secondary | ICD-10-CM | POA: Insufficient documentation

## 2019-05-27 DIAGNOSIS — I129 Hypertensive chronic kidney disease with stage 1 through stage 4 chronic kidney disease, or unspecified chronic kidney disease: Secondary | ICD-10-CM | POA: Insufficient documentation

## 2019-05-27 DIAGNOSIS — I10 Essential (primary) hypertension: Secondary | ICD-10-CM | POA: Insufficient documentation

## 2019-05-27 DIAGNOSIS — N390 Urinary tract infection, site not specified: Secondary | ICD-10-CM | POA: Diagnosis not present

## 2019-05-27 DIAGNOSIS — Z95828 Presence of other vascular implants and grafts: Secondary | ICD-10-CM

## 2019-05-27 DIAGNOSIS — I2510 Atherosclerotic heart disease of native coronary artery without angina pectoris: Secondary | ICD-10-CM | POA: Diagnosis not present

## 2019-05-27 DIAGNOSIS — C7951 Secondary malignant neoplasm of bone: Secondary | ICD-10-CM | POA: Diagnosis not present

## 2019-05-27 DIAGNOSIS — T464X5A Adverse effect of angiotensin-converting-enzyme inhibitors, initial encounter: Secondary | ICD-10-CM | POA: Insufficient documentation

## 2019-05-27 DIAGNOSIS — M1990 Unspecified osteoarthritis, unspecified site: Secondary | ICD-10-CM | POA: Insufficient documentation

## 2019-05-27 DIAGNOSIS — K8020 Calculus of gallbladder without cholecystitis without obstruction: Secondary | ICD-10-CM | POA: Insufficient documentation

## 2019-05-27 DIAGNOSIS — E1136 Type 2 diabetes mellitus with diabetic cataract: Secondary | ICD-10-CM | POA: Insufficient documentation

## 2019-05-27 DIAGNOSIS — E039 Hypothyroidism, unspecified: Secondary | ICD-10-CM | POA: Insufficient documentation

## 2019-05-27 DIAGNOSIS — N189 Chronic kidney disease, unspecified: Secondary | ICD-10-CM | POA: Insufficient documentation

## 2019-05-27 DIAGNOSIS — K5730 Diverticulosis of large intestine without perforation or abscess without bleeding: Secondary | ICD-10-CM | POA: Insufficient documentation

## 2019-05-27 DIAGNOSIS — C7800 Secondary malignant neoplasm of unspecified lung: Secondary | ICD-10-CM | POA: Insufficient documentation

## 2019-05-27 DIAGNOSIS — Z87891 Personal history of nicotine dependence: Secondary | ICD-10-CM | POA: Insufficient documentation

## 2019-05-27 DIAGNOSIS — R110 Nausea: Secondary | ICD-10-CM | POA: Insufficient documentation

## 2019-05-27 DIAGNOSIS — E440 Moderate protein-calorie malnutrition: Secondary | ICD-10-CM | POA: Diagnosis not present

## 2019-05-27 DIAGNOSIS — K649 Unspecified hemorrhoids: Secondary | ICD-10-CM | POA: Diagnosis not present

## 2019-05-27 DIAGNOSIS — K219 Gastro-esophageal reflux disease without esophagitis: Secondary | ICD-10-CM | POA: Insufficient documentation

## 2019-05-27 DIAGNOSIS — Z7189 Other specified counseling: Secondary | ICD-10-CM

## 2019-05-27 LAB — CMP (CANCER CENTER ONLY)
ALT: 17 U/L (ref 0–44)
AST: 18 U/L (ref 15–41)
Albumin: 3.8 g/dL (ref 3.5–5.0)
Alkaline Phosphatase: 58 U/L (ref 38–126)
Anion gap: 9 (ref 5–15)
BUN: 36 mg/dL — ABNORMAL HIGH (ref 8–23)
CO2: 21 mmol/L — ABNORMAL LOW (ref 22–32)
Calcium: 9.2 mg/dL (ref 8.9–10.3)
Chloride: 104 mmol/L (ref 98–111)
Creatinine: 1.63 mg/dL — ABNORMAL HIGH (ref 0.44–1.00)
GFR, Est AFR Am: 35 mL/min — ABNORMAL LOW (ref >60)
GFR, Estimated: 30 mL/min — ABNORMAL LOW (ref >60)
Glucose, Bld: 171 mg/dL — ABNORMAL HIGH (ref 70–99)
Potassium: 4.9 mmol/L (ref 3.5–5.1)
Sodium: 134 mmol/L — ABNORMAL LOW (ref 135–145)
Total Bilirubin: 0.5 mg/dL (ref 0.3–1.2)
Total Protein: 6.8 g/dL (ref 6.5–8.1)

## 2019-05-27 LAB — CBC WITH DIFFERENTIAL/PLATELET
Abs Immature Granulocytes: 0.19 10*3/uL — ABNORMAL HIGH (ref 0.00–0.07)
Basophils Absolute: 0.1 10*3/uL (ref 0.0–0.1)
Basophils Relative: 1 %
Eosinophils Absolute: 0.2 10*3/uL (ref 0.0–0.5)
Eosinophils Relative: 2 %
HCT: 38.5 % (ref 36.0–46.0)
Hemoglobin: 12.6 g/dL (ref 12.0–15.0)
Immature Granulocytes: 2 %
Lymphocytes Relative: 17 %
Lymphs Abs: 1.7 10*3/uL (ref 0.7–4.0)
MCH: 28.9 pg (ref 26.0–34.0)
MCHC: 32.7 g/dL (ref 30.0–36.0)
MCV: 88.3 fL (ref 80.0–100.0)
Monocytes Absolute: 1 10*3/uL (ref 0.1–1.0)
Monocytes Relative: 10 %
Neutro Abs: 6.5 10*3/uL (ref 1.7–7.7)
Neutrophils Relative %: 68 %
Platelets: 247 10*3/uL (ref 150–400)
RBC: 4.36 MIL/uL (ref 3.87–5.11)
RDW: 14.2 % (ref 11.5–15.5)
WBC: 9.5 10*3/uL (ref 4.0–10.5)
nRBC: 0 % (ref 0.0–0.2)

## 2019-05-27 MED ORDER — SODIUM CHLORIDE 0.9% FLUSH
10.0000 mL | Freq: Once | INTRAVENOUS | Status: AC
  Administered 2019-05-27: 10 mL
  Filled 2019-05-27: qty 10

## 2019-05-27 MED ORDER — SODIUM CHLORIDE 0.9 % IV SOLN
240.0000 mg | Freq: Once | INTRAVENOUS | Status: AC
  Administered 2019-05-27: 240 mg via INTRAVENOUS
  Filled 2019-05-27: qty 24

## 2019-05-27 MED ORDER — SODIUM CHLORIDE 0.9% FLUSH
10.0000 mL | INTRAVENOUS | Status: DC | PRN
  Administered 2019-05-27: 10 mL
  Filled 2019-05-27: qty 10

## 2019-05-27 MED ORDER — HEPARIN SOD (PORK) LOCK FLUSH 100 UNIT/ML IV SOLN
500.0000 [IU] | Freq: Once | INTRAVENOUS | Status: AC | PRN
  Administered 2019-05-27: 500 [IU]
  Filled 2019-05-27: qty 5

## 2019-05-27 MED ORDER — SODIUM CHLORIDE 0.9 % IV SOLN
Freq: Once | INTRAVENOUS | Status: AC
  Filled 2019-05-27: qty 250

## 2019-05-27 NOTE — Patient Instructions (Signed)
[facility] Discharge Instructions for Patients Receiving Chemotherapy  Today you received the following chemotherapy agents Opdivo  To help prevent nausea and vomiting after your treatment, we encourage you to take your nausea medication .   If you develop nausea and vomiting that is not controlled by your nausea medication, call the clinic.   BELOW ARE SYMPTOMS THAT SHOULD BE REPORTED IMMEDIATELY:  *FEVER GREATER THAN 100.5 F  *CHILLS WITH OR WITHOUT FEVER  NAUSEA AND VOMITING THAT IS NOT CONTROLLED WITH YOUR NAUSEA MEDICATION  *UNUSUAL SHORTNESS OF BREATH  *UNUSUAL BRUISING OR BLEEDING  TENDERNESS IN MOUTH AND THROAT WITH OR WITHOUT PRESENCE OF ULCERS  *URINARY PROBLEMS  *BOWEL PROBLEMS  UNUSUAL RASH Items with * indicate a potential emergency and should be followed up as soon as possible.  Feel free to call the clinic should you have any questions or concerns. The clinic phone number is [phone].  Please show the CHEMO ALERT CARD at check-in to the Emergency Department and triage nurse.   

## 2019-05-27 NOTE — Progress Notes (Signed)
Okay to treat today with Creat. 1.63, per [name].

## 2019-05-27 NOTE — Progress Notes (Signed)
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 She is here for C30D1 of Nivolumab.The patient's last visit with [name] was on 04/28/2019. The pt reports that she is doing well overall.  The pt reports she spent time in the ED for UTI. She was given IV I Abx and then oral antibiotics for a week. Most of her symptoms have decreased and nearly resolved.  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  She has another family member who has tested positive for COVID-19.    Lab results today (05/27/19) of CBC w/diff and CMP is as follows: all values are WNL except for Abs Immature Granulocytes at 0.19, Sodium at 134, CO2 at 21, Glucose, Bld at 171, BUN at 36, Creatinine at 1.63, GFR Est Non Af Am at 30, GFR Est AFR Am at 35.  On review of systems, pt reports eating well,  and denies fevers, chills, skin rashes, abdominal pain and any other symptoms.    MEDICAL HISTORY:   Past Medical History:  Diagnosis Date  . Anemia   . Arthritis    lower back, hips, hands  . Biliary stricture   . Diabetes mellitus ([place])    type 2 - no meds, diet controlled  . Early cataracts, bilateral   . Elevated liver enzymes   . GERD (gastroesophageal reflux disease)    occasional - diet controlled  . History of hiatal hernia   . HTN (hypertension)   . Hyperlipidemia   . Hypothyroidism   . left renal ca dx'd 2018   renal cancer - left kidney removed, pill chemo x  1 yr  . Myocardial infarction ([place]) 1991   no deficits  . SVD (spontaneous vaginal delivery)    x 3  . Wears glasses    Dyslipidemia Osteoarthritis Ex-smoker Coronary artery disease Thyroid disorder-was apparently on levothyroxine 25 g daily which has subsequently been discontinued . Mitral regurgitation B12 deficiency  hiatal hernia with esophagitis   SURGICAL HISTORY: Past Surgical History:  Procedure Laterality Date  . BALLOON DILATION N/A 07/23/2018   Procedure: BALLOON DILATION;  Surgeon: Rush Landmark Telford Nab., MD;  Location: [place];  Service: Gastroenterology;  Laterality: N/A;  . BILIARY BRUSHING  08/10/2018   Procedure: BILIARY BRUSHING;  Surgeon: Rush Landmark Telford Nab., MD;  Location: [place];  Service: Gastroenterology;;  . BILIARY BRUSHING  11/30/2018   Procedure: BILIARY BRUSHING;  Surgeon: Irving Copas., MD;  Location: [place];  Service: Gastroenterology;;  . BILIARY DILATION  08/10/2018   Procedure: BILIARY DILATION;  Surgeon: Irving Copas., MD;  Location: [place];  Service: Gastroenterology;;  . BILIARY DILATION  08/27/2018   Procedure: BILIARY DILATION;  Surgeon: Irving Copas., MD;  Location: [place];  Service: Gastroenterology;;  . BILIARY STENT PLACEMENT  08/10/2018   Procedure: BILIARY STENT PLACEMENT;  Surgeon: Irving Copas., MD;  Location: [place];  Service: Gastroenterology;;  . BILIARY STENT PLACEMENT  08/27/2018  Procedure: BILIARY STENT PLACEMENT;  Surgeon: Rush Landmark Telford Nab., MD;  Location: [place];  Service: Gastroenterology;;  . BILIARY STENT PLACEMENT  11/30/2018   Procedure: BILIARY STENT PLACEMENT;  Surgeon: Irving Copas., MD;  Location: [place];  Service: Gastroenterology;;  . BIOPSY  07/23/2018   Procedure: BIOPSY;  Surgeon: Irving Copas., MD;  Location: [place];  Service: Gastroenterology;;  . CHOLECYSTECTOMY N/A 09/02/2018   Procedure:  LAPAROSCOPIC CHOLECYSTECTOMY;  Surgeon: Stark Klein, MD;  Location: [place];  Service: General;  Laterality: N/A;  . COLONOSCOPY     normal   . ENDOSCOPIC RETROGRADE CHOLANGIOPANCREATOGRAPHY (ERCP) WITH PROPOFOL N/A 08/10/2018   Procedure: ENDOSCOPIC RETROGRADE CHOLANGIOPANCREATOGRAPHY (ERCP) WITH PROPOFOL;  Surgeon: Irving Copas., MD;  Location: [place];  Service: Gastroenterology;  Laterality: N/A;  . ENDOSCOPIC RETROGRADE CHOLANGIOPANCREATOGRAPHY (ERCP) WITH PROPOFOL N/A 11/30/2018   Procedure: ENDOSCOPIC RETROGRADE CHOLANGIOPANCREATOGRAPHY (ERCP) WITH PROPOFOL;  Surgeon: Rush Landmark Telford Nab., MD;  Location: [place];  Service: Gastroenterology;  Laterality: N/A;  . ERCP N/A 07/23/2018   Procedure: ENDOSCOPIC RETROGRADE CHOLANGIOPANCREATOGRAPHY (ERCP);  Surgeon: Irving Copas., MD;  Location: [place];  Service: Gastroenterology;  Laterality: N/A;  . ERCP N/A 08/27/2018   Procedure: ENDOSCOPIC RETROGRADE CHOLANGIOPANCREATOGRAPHY (ERCP);  Surgeon: Irving Copas., MD;  Location: [place];  Service: Gastroenterology;  Laterality: N/A;  . ESOPHAGOGASTRODUODENOSCOPY (EGD) WITH PROPOFOL N/A 08/27/2018   Procedure: ESOPHAGOGASTRODUODENOSCOPY (EGD) WITH PROPOFOL;  Surgeon: Rush Landmark Telford Nab., MD;  Location: [place];  Service: Gastroenterology;  Laterality: N/A;  . EUS N/A 08/27/2018   Procedure: ESOPHAGEAL ENDOSCOPIC ULTRASOUND (EUS) RADIAL;  Surgeon: Rush Landmark Telford Nab., MD;  Location: [place];  Service: Gastroenterology;  Laterality: N/A;  . FINE NEEDLE ASPIRATION  08/27/2018   Procedure: FINE NEEDLE ASPIRATION (FNA) LINEAR;  Surgeon: Irving Copas., MD;  Location: [place];  Service: Gastroenterology;;  . IR IMAGING GUIDED PORT INSERTION  01/08/2018  . LAPAROSCOPIC NEPHRECTOMY Left 08/01/2016   Procedure: LAPAROSCOPIC  RADICAL NEPHRECTOMY/ REPAIR OF UMBILICAL HERNIA;  Surgeon: Raynelle Bring, MD;  Location: [facility];  Service: Urology;   Laterality: Left;  . REMOVAL OF STONES  07/23/2018   Procedure: REMOVAL OF GALL STONES;  Surgeon: Rush Landmark Telford Nab., MD;  Location: [place];  Service: Gastroenterology;;  . REMOVAL OF STONES  08/10/2018   Procedure: REMOVAL OF STONES;  Surgeon: Irving Copas., MD;  Location: [place];  Service: Gastroenterology;;  . REMOVAL OF STONES  08/27/2018   Procedure: REMOVAL OF STONES;  Surgeon: Irving Copas., MD;  Location: [place];  Service: Gastroenterology;;  . [name]  07/23/2018   Procedure: [name];  Surgeon: Irving Copas., MD;  Location: [place];  Service: Gastroenterology;;  . Lavell Islam REMOVAL  08/27/2018   Procedure: STENT REMOVAL;  Surgeon: Irving Copas., MD;  Location: [place];  Service: Gastroenterology;;  . Lavell Islam REMOVAL  11/30/2018   Procedure: STENT REMOVAL;  Surgeon: Irving Copas., MD;  Location: [place];  Service: Gastroenterology;;  . UPPER GI ENDOSCOPY     x 1   No reported past surgeries EGD 05/01/2016 Dr. Earley Brooke Colonoscopy 03/2016 Dr. Earley Brooke  SOCIAL HISTORY: Social History   Socioeconomic History  . Marital status: Married    Spouse name: Not on file  . Number of children: 3  . Years of education: Not on file  . Highest education level: Not on file  Occupational History  . Occupation: retired  Tobacco Use  . Smoking status: Former Smoker    Packs/day: 1.00    Years: 8.00  Pack years: 8.00    Types: Cigarettes    Quit date: 06/18/1964    Years since quitting: 54.9  . Smokeless tobacco: Never Used  Substance and Sexual Activity  . Alcohol use: No  . Drug use: No  . Sexual activity: Not on file  Other Topics Concern  . Not on file  Social History Narrative   Married.  Three children   Social Determinants of Health   Financial Resource Strain:   . Difficulty of Paying Living Expenses: Not on file  Food Insecurity:   . Worried About Charity fundraiser in the Last Year:  Not on file  . Ran Out of Food in the Last Year: Not on file  Transportation Needs:   . Lack of Transportation (Medical): Not on file  . Lack of Transportation (Non-Medical): Not on file  Physical Activity:   . Days of Exercise per Week: Not on file  . Minutes of Exercise per Session: Not on file  Stress:   . Feeling of Stress : Not on file  Social Connections:   . Frequency of Communication with Friends and Family: Not on file  . Frequency of Social Gatherings with Friends and Family: Not on file  . Attends Religious Services: Not on file  . Active Member of Clubs or Organizations: Not on file  . Attends Archivist Meetings: Not on file  . Marital Status: Not on file  Intimate Partner Violence:   . Fear of Current or Ex-Partner: Not on file  . Emotionally Abused: Not on file  . Physically Abused: Not on file  . Sexually Abused: Not on file   Patient lives in [place] Works as a Solicitor part-time Ex-smoker quit long time ago In 1961 . Smoked 2 packs per day for 8 years prior to that .  or a lot of stress since her daughter is also getting treated for stage IV uterine cancer.  FAMILY HISTORY:  Mother deceased Father with asthma and emphysema died at 43 years with the MI. Brother deceased of heart disease   ALLERGIES:  is allergic to adhesive [tape]; gabapentin; nsaids; and statins.  MEDICATIONS:  Current Outpatient Medications  Medication Sig Dispense Refill  . amLODipine (NORVASC) 5 MG tablet Take 5 mg by mouth daily.   6  . atorvastatin (LIPITOR) 40 MG tablet Take 40 mg by mouth at bedtime.     . carvedilol (COREG) 6.25 MG tablet Take 6.25 mg by mouth 2 (two) times daily.  6  . cephALEXin (KEFLEX) 500 MG capsule Take 1 capsule (500 mg total) by mouth 4 (four) times daily. 28 capsule 0  . clobetasol ointment (TEMOVATE) [id number] % Apply 1 application topically 2 (two) times daily as needed (lichen sclerosis).     . Denosumab (XGEVA [place]) Inject into  the skin every 6 (six) weeks.    . hydrocortisone (CORTEF) 10 MG tablet Take 5-10 mg by mouth See admin instructions. Take 1 tablet (10 mg) by mouth in the morning & 1/2 tablet (5 mg) by mouth in the evening.    Marland Kitchen levothyroxine (SYNTHROID) 75 MCG tablet Take 75 mcg by mouth daily before breakfast.     . lidocaine (LIDODERM) 5 % Place 1 patch onto the skin daily. Remove & Discard patch within 12 hours or as directed by MD Using on Right Hip 30 patch 0  . lidocaine-prilocaine (EMLA) cream Apply 1 application topically daily as needed (prior to port access). 30 g 1  . Nivolumab (  OPDIVO IV) Inject 1 Dose into the vein every 14 (fourteen) days. [place]    . omeprazole (PRILOSEC) 20 MG capsule Take 1 capsule by mouth once daily 60 capsule 0  . repaglinide (PRANDIN) 0.5 MG tablet Take 0.5 mg by mouth 2 (two) times daily before a meal.      No current facility-administered medications for this visit.   Facility-Administered Medications Ordered in Other Visits  Medication Dose Route Frequency Provider Last Rate Last Admin  . sodium chloride flush (NS) 0.9 % injection 10 mL  10 mL Intracatheter PRN Truitt Merle, MD   10 mL at 11/20/18 1232    REVIEW OF SYSTEMS:   A 10+ POINT REVIEW OF SYSTEMS WAS OBTAINED including neurology, dermatology, psychiatry, cardiac, respiratory, lymph, extremities, GI, GU, Musculoskeletal, constitutional, breasts, reproductive, HEENT.  All pertinent positives are noted in the HPI.  All others are negative.    PHYSICAL EXAMINATION:  ECOG FS:2 - Symptomatic, <50% confined to bed  Vitals:   05/27/19 0851  BP: (!) 148/70  Pulse: 66  Resp: 18  Temp: 98.3 F (36.8 C)  SpO2: 100%   Wt Readings from Last 3 Encounters:  05/27/19 133 lb 11.2 oz (60.6 kg)  04/28/19 135 lb 6.4 oz (61.4 kg)  03/26/19 131 lb 8 oz (59.6 kg)   Body mass index is 27 kg/m.    GENERAL:alert, in no acute distress and comfortable SKIN: no acute rashes, no significant lesions EYES:  conjunctiva are pink and non-injected, sclera anicteric OROPHARYNX: MMM, no exudates, no oropharyngeal erythema or ulceration NECK: supple, no JVD LYMPH:  no palpable lymphadenopathy in the cervical, axillary or inguinal regions LUNGS: clear to auscultation b/l with normal respiratory effort HEART: regular rate & rhythm ABDOMEN:  normoactive bowel sounds , non tender, not distended. Extremity: no pedal edema PSYCH: alert & oriented x 3 with fluent speech NEURO: no focal motor/sensory deficits  LABORATORY DATA:  I have reviewed the data as listed  . CBC Latest Ref Rng & Units 05/27/2019 05/14/2019 05/12/2019  WBC 4.0 - 10.5 K/uL 9.5 13.8(H) 7.4  Hemoglobin 12.0 - 15.0 g/dL 12.6 14.0 12.5  Hematocrit 36.0 - 46.0 % 38.5 43.9 38.4  Platelets 150 - 400 K/uL 247 283 259    CBC    Component Value Date/Time   WBC 9.5 05/27/2019 0745   RBC 4.36 05/27/2019 0745   HGB 12.6 05/27/2019 0745   HGB 11.4 (L) 09/11/2018 0900   HGB 11.2 (L) 04/04/2017 0830   HCT 38.5 05/27/2019 0745   HCT 35.2 04/04/2017 0830   PLT 247 05/27/2019 0745   PLT 277 09/11/2018 0900   PLT 250 04/04/2017 0830   MCV 88.3 05/27/2019 0745   MCV 107.6 (H) 04/04/2017 0830   MCH 28.9 05/27/2019 0745   MCHC 32.7 05/27/2019 0745   RDW 14.2 05/27/2019 0745   RDW 14.0 04/04/2017 0830   LYMPHSABS 1.7 05/27/2019 0745   LYMPHSABS 1.6 04/04/2017 0830   MONOABS 1.0 05/27/2019 0745   MONOABS 0.4 04/04/2017 0830   EOSABS 0.2 05/27/2019 0745   EOSABS 0.1 04/04/2017 0830   BASOSABS 0.1 05/27/2019 0745   BASOSABS 0.0 04/04/2017 0830     . CMP Latest Ref Rng & Units 05/27/2019 05/14/2019 05/12/2019  Glucose 70 - 99 mg/dL 171(H) 159(H) 173(H)  BUN 8 - 23 mg/dL 36(H) 33(H) 31(H)  Creatinine 0.44 - 1.00 mg/dL 1.63(H) 1.84(H) 1.50(H)  Sodium 135 - 145 mmol/L 134(L) 134(L) 137  Potassium 3.5 - 5.1 mmol/L 4.9 4.7 4.5  Chloride 98 - 111 mmol/L 104 102 107  CO2 22 - 32 mmol/L 21(L) 20(L) 20(L)  Calcium 8.9 - 10.3 mg/dL 9.2 9.9 8.8(L)   Total Protein 6.5 - 8.1 g/dL 6.8 6.9 6.6  Total Bilirubin 0.3 - 1.2 mg/dL 0.5 0.1(L) 0.4  Alkaline Phos 38 - 126 U/L 58 67 62  AST 15 - 41 U/L 18 19 14(L)  ALT 0 - 44 U/L 17 16 13    B12 level 299 (OSH)-->455  . Lab Results  Component Value Date   LDH 136 03/13/2018        08/27/18 Surgical Pathology from Cholecystectomy:         RADIOGRAPHIC STUDIES: I have personally reviewed the radiological images as listed and agreed with the findings in the report. Nm Pet Image Restag (ps) Skull Base To Thigh  Result Date: 01/03/2017 IMPRESSION: 1. There is a new lesion in the hepatic dome which is FDG avid and low in attenuation on CT imaging consistent with metastatic disease. Another region of uptake in the left hepatic lobe posteriorly demonstrates no CT correlate. A second subtle metastasis is not excluded on today's study. An MRI could better assess for other hepatic metastases if clinically warranted. 2. New metastatic lesion in the left side of T8. 3. New pulmonary nodule in the right upper lobe. This is too small to characterize but suspicious. Recommend attention on follow-up. No FDG avid nodules or other enlarging nodules. 4. The uptake at the previous left renal artery has almost resolved in the interval and is favored to be post therapeutic. Recommend attention on follow-up. 5. The metastasis in the posterior right hilum seen previously has resolved. Electronically Signed   By: Dorise Bullion III M.D   On: 01/03/2017 11:16   .NCV with EMG(electromyography)  Result Date: 05/06/2019 Penni Bombard, MD     05/12/2019  6:29 PM  [facility] NCS (NERVE CONDUCTION STUDY) WITH EMG (ELECTROMYOGRAPHY) REPORT STUDY DATE: 05/06/19 PATIENT NAME: KIHANNA BRUCKNER DOB: 12/03/1940 MRN: [id number] ORDERING CLINICIAN: Barbette Hair, MD TECHNOLOGIST: Sherre Scarlet ELECTROMYOGRAPHER: Earlean Polka. Penumalli, MD CLINICAL INFORMATION: 79 year old female with right foot / leg numbness. FINDINGS: NERVE  CONDUCTION STUDY: Left peroneal and left tibial motor responses are normal. Right peroneal motor response has normal distal latency, normal amplitude, mildly slow conduction velocity. Right tibial motor response has normal distal latency, decreased amplitude, mildly slow conduction velocity. Right superficial peroneal response could not be obtained.  Left superficial peroneal and right sural sensory responses have decreased amplitudes. Left sural sensory response is normal. Left tibial F-wave latency is normal. Right tibial F-wave latency is prolonged. NEEDLE ELECTROMYOGRAPHY: Needle examination of right lower extremity is normal. Right lumbar paraspinal muscles have 1+ positive sharp waves at rest. IMPRESSION: Abnormal study demonstrating: - Bilateral distal, axonal sensorimotor polyneuropathy. - Mild right lumbar nerve root irritation on needle EMG may be due to superimposed right lumbar radiculopathy. INTERPRETING PHYSICIAN: Penni Bombard, MD Certified in Neurology, Neurophysiology and Neuroimaging [facility] [address], [place], [place] [address] [phone] [facility]   Nerve / Sites Muscle Latency Ref. Amplitude Ref. Rel Amp Segments Distance Velocity Ref. Area   ms ms mV mV %  cm m/s m/s mVms R Peroneal - EDB    Ankle EDB 5.5 ?6.5 3.6 ?2.0 100 Ankle - EDB 9   12.5    Fib head EDB 12.0  3.1  85.1 Fib head - Ankle 26 39 ?44 11.5    Pop fossa EDB  14.8  3.0  96.1 Pop fossa - Fib head 10 36 ?44 10.8        Pop fossa - Ankle     L Peroneal - EDB    Ankle EDB 4.7 ?6.5 3.6 ?2.0 100 Ankle - EDB 9   11.3    Fib head EDB 10.8  3.1  85.8 Fib head - Ankle 27 44 ?44 11.3    Pop fossa EDB 13.1  3.2  102 Pop fossa - Fib head 10 44 ?44 11.9        Pop fossa - Ankle     R Tibial - AH    Ankle AH 5.4 ?5.8 0.4 ?4.0 100 Ankle - AH 9   1.8    Pop fossa AH 14.8  0.7  189 Pop fossa - Ankle 34 36 ?41 2.8 L Tibial - AH    Ankle AH 4.7 ?5.8 5.8 ?4.0 100 Ankle - AH 9   14.3    Pop fossa AH 13.0  2.3  39 Pop  fossa - Ankle 34 41 ?41 10.5           SNC   Nerve / Sites Rec. Site Peak Lat Ref.  Amp Ref. Segments Distance   ms ms V V  cm R Sural - Ankle (Calf)    Calf Ankle 4.2 ?4.4 2 ?6 Calf - Ankle 14 L Sural - Ankle (Calf)    Calf Ankle 4.1 ?4.4 6 ?6 Calf - Ankle 14 R Superficial peroneal - Ankle    Lat leg Ankle NR ?4.4 NR ?6 Lat leg - Ankle 14 L Superficial peroneal - Ankle    Lat leg Ankle 4.4 ?4.4 5 ?6 Lat leg - Ankle 14           F  Wave   Nerve F Lat Ref.  ms ms R Tibial - AH 62.6 ?56.0 L Tibial - AH 52.7 ?56.0       EMG Summary Table   Spontaneous MUAP Recruitment Muscle IA Fib PSW Fasc Other Amp Dur. Poly Pattern R. Vastus medialis Normal None None None _______ Normal Normal Normal Normal R. Tibialis anterior Normal None None None _______ Increased Normal Normal Reduced R. Gastrocnemius (Medial head) Normal None None None _______ Normal Normal Normal Normal R. Lumbar paraspinals Increased None 1+ None _______ Normal Normal Normal Normal     ASSESSMENT & PLAN:    79 y.o. 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 PET/CT 06/19/2017 -- consistent with partial metabolic response to treatment.  Rt adrenal gland bx - showed clear cell RCC  S/p CYtoreductive left radical nephrectomy and left adrenal gland resection on 08/01/2016 by Dr Alinda Money.  10/16/17 PET/CT revealed Continued improvement, with the T7 metastatic lesion no longer significantly hypermetabolic. The previous right lower lobe pulmonary nodule is even less apparent, perhaps about 2 mm in diameter today; given that this measured 6 mm on 03/28/2017 this probably represents an effectively treated metastatic lesion. Currently no appreciable hypermetabolic activity is identified to suggest active malignancy. Distended gallbladder with gallbladder wall thickening and gallstones. Correlate clinically in assessing for  cholecystitis. Small but abnormal amount of free pelvic fluid, nonspecific. Other imaging findings of potential clinical significance: Chronic ethmoid sinusitis. Aortic Atherosclerosis. Stable 5 mm right middle lobe pulmonary nodule, not hypermetabolic but below sensitive PET-CT size thresholds. Left nephrectomy. Notable pelvic floor laxity with cystocele. Chronic bilateral Sacroiliitis.  04/03/18 PET/CT revealed No evidence for new or progressive hypermetabolic disease on today's study to suggest metastatic progression. 2. Stable appearance of the T7 metastatic lesion without Hypermetabolism. 3. Tiny focus of FDG accumulation identified along the skin of the low right inguinal fold. No associated lesion evident on CT. This may be related to urinary contaminant. 4. Cholelithiasis with similar appearance of diffuse gallbladder wall thickening. 5. Stable 5 mm right middle lobe pulmonary nodule. 6.  Aortic Atherosclerois. 7. Diffuse colonic diverticulosis.   09/18/18 CT A/P revealed "Common duct stent in place. Improvement to resolution in biliary duct dilatation compared to 08/29/2018. Interval cholecystectomy without acute complication. 2. Left nephrectomy, without evidence of metastatic disease. 3.  Tiny hiatal hernia. 4. Coronary artery atherosclerosis. Aortic Atherosclerosis. 5. Mild limitations secondary to lack of IV contrast. 6. Marked pelvic floor laxity with cystocele and rectal prolapse".  #2 b/l adrenal metastases from [place] s/p left adrenalectomy with adrenal insuff - follows with Dr Buddy Duty.  #3 Small pulmonary lesions -- 10/16/17 PET/CT showed pulmonary less apparent than 03/28/17 PET/CT, decreasing from 19mm to 13mm diameter.   MRI brain shows no evidence of metastatic disease  #4 T7/8 Bone metastases - received Xgeva every 4 weeks from May 2018 to June 2019. 10/16/17 PET/CT revealed improvement with T7 metastatic lesion no longer significantly hypermetabolic.  -on Marchelle Folks  #5 ?liver mets- rpt  PET/CT from 10/16/2017 shows no overt evidence of metastatic disease in the liver.  #6 Grade 1 Nausea - improved and intermittent. Hasnt used her anti-emetic as instructed so some nausea and decreased po intake.  #7 Grade 1 Diarrhea - resolved  #8 Hyponatremia -  resolved with sodium at 136 - likely related to some element of adrenal insufficiency, diarrhea, limited by mouth intake. Primarily solute free fluid intake.   #9 Acute on chronic renal insufficiency Creatine stable 1.56  #10 Hyperkalemia due to ACEI + RF- resolved  #11 Hemorrhoids -chronic with some bleeding --Recommended Sitz bath and OTC Anusol or Nupercaine for her hemorrhoid relief. -f/u with PCP for continued mx   #12 Moderate protein calorie malnutrition Weight has stabilized and improved Wt Readings from Last 3 Encounters:  04/28/19 135 lb 6.4 oz (61.4 kg)  03/26/19 131 lb 8 oz (59.6 kg)  03/12/19 129 lb (58.5 kg)   Plan: Continue healthy po intake/diabetic diet -Previously recommended the patient to drink atleast 48-64 oz of fluids daily -f/u with PCP/Cardiology for diuretics management  #13 Hypothyroidism/Adrenal insufficiency/Diabetes -Continue being followed by Dr. Buddy Duty  #14 HTN - ?control. Patient tends to be anxious and has higher blood pressures in the clinic. She can have increased blood pressure from Sutent as well. Plan: -continued close f/u with her PCP /cardiology regarding the many elements necessary for her care that are not directly related to her oncology care. -ACEI held due to AKI and hyperkalemia - following with cardiology to mx this. Has been started on Amlodipine instead.  #15 Grade 1 mucositis- resolved -Advised the patient to continue use of Magic Mouthwash to aid with nutrition  #16 Newly diagnosed ?CAD - positive cardiac nuclear stress test Following with cardiology and nephrology ? Need for pre-operative cardiac cath  #17 Ch/o holedocholithiasis and cholelithiasis 07/10/18 US  Abdomen revealed Biliary duct dilatation with 8 mm calculus at the level of the ampulla in the distal common bile duct. 2. Cholelithiasis. No gallbladder wall thickening or pericholecystic fluid. 3. Appearance of the liver raises concern for underlying hepatic cirrhosis. No focal liver lesions are demonstrable. 4.  Left kidney absent. 5.  Small cysts in right kidney. 6.  Aortic Atherosclerosis.  S/p ERCP on 07/23/18 with Dr. Valarie Merino Mansouraty  09/02/18 Cytopathology from ERCP was suspicious for malignant cells in bile duct, but was not definitive, other two findings were benign.  PLAN:  -Discussed pt labwork today, 05/27/19; all values are WNL except for Abs Immature Granulocytes at 0.19, Sodium at 134, CO2 at 21, Glucose, Bld at 171, BUN at 36, Creatinine at 1.63, GFR Est Non Af Am at 30, GFR Est AFR Am at 35. -Discussed PCP could consider referral for a urologist if UTI's are persistent/recurrent. -Advised to take precautions when giving her grandson his birthday gift to avoid exposure to COVID-19 -Discussed COVID-19 vaccine and recommended that she receive it. -Today is Cycle 22 day one of Nivolumab  -Will order PET Scan in 3 weeks -Will change Xgeva to every 6 weeks  -Will see back in one month   FOLLOW UP: Continue Nivolumab q2weeks with port flush and labs please schedule next 4 doses Continue Xgeva every 6 weeks PET/CT in 3 weeks Next MD visit in 4 weeks   The total time spent in the appt was 35 minutes and more than 50% was on counseling and direct patient cares.  All of the patient's questions were answered with apparent satisfaction. The patient knows to call the clinic with any problems, questions or concerns.   Sullivan Lone MD MS AAHIVMS [facility] Hematology/Oncology Physician [facility]  (Office):       [phone] (Work cell):  [phone] (Fax):           [phone]   I, Scot Dock, am acting as a scribe for Dr. Sullivan Lone.   .I have reviewed the  above documentation for accuracy and completeness, and I agree with the above. Brunetta Genera MD

## 2019-05-31 ENCOUNTER — Telehealth: Payer: Self-pay | Admitting: Hematology

## 2019-05-31 NOTE — Telephone Encounter (Signed)
Scheduled per 02/04 los, patient has been called and voicemail was left.  

## 2019-06-08 ENCOUNTER — Telehealth: Payer: Self-pay | Admitting: Hematology

## 2019-06-08 NOTE — Telephone Encounter (Signed)
Rescheduled per 2/12 sch msg, pt req. Called and left a msg. Mailing printout

## 2019-06-09 ENCOUNTER — Inpatient Hospital Stay: Payer: Medicare Other

## 2019-06-09 ENCOUNTER — Ambulatory Visit: Payer: 59

## 2019-06-09 ENCOUNTER — Other Ambulatory Visit: Payer: Self-pay

## 2019-06-09 ENCOUNTER — Other Ambulatory Visit: Payer: 59

## 2019-06-09 VITALS — BP 135/60 | HR 62 | Temp 98.5°F | Resp 16

## 2019-06-09 DIAGNOSIS — C7951 Secondary malignant neoplasm of bone: Secondary | ICD-10-CM

## 2019-06-09 DIAGNOSIS — C642 Malignant neoplasm of left kidney, except renal pelvis: Secondary | ICD-10-CM

## 2019-06-09 DIAGNOSIS — Z95828 Presence of other vascular implants and grafts: Secondary | ICD-10-CM

## 2019-06-09 DIAGNOSIS — Z7189 Other specified counseling: Secondary | ICD-10-CM

## 2019-06-09 DIAGNOSIS — Z5112 Encounter for antineoplastic immunotherapy: Secondary | ICD-10-CM | POA: Diagnosis not present

## 2019-06-09 LAB — CMP (CANCER CENTER ONLY)
ALT: 19 U/L (ref 0–44)
AST: 19 U/L (ref 15–41)
Albumin: 3.8 g/dL (ref 3.5–5.0)
Alkaline Phosphatase: 64 U/L (ref 38–126)
Anion gap: 10 (ref 5–15)
BUN: 29 mg/dL — ABNORMAL HIGH (ref 8–23)
CO2: 22 mmol/L (ref 22–32)
Calcium: 9.5 mg/dL (ref 8.9–10.3)
Chloride: 108 mmol/L (ref 98–111)
Creatinine: 1.66 mg/dL — ABNORMAL HIGH (ref 0.44–1.00)
GFR, Est AFR Am: 34 mL/min — ABNORMAL LOW (ref >60)
GFR, Estimated: 29 mL/min — ABNORMAL LOW (ref >60)
Glucose, Bld: 150 mg/dL — ABNORMAL HIGH (ref 70–99)
Potassium: 4.6 mmol/L (ref 3.5–5.1)
Sodium: 140 mmol/L (ref 135–145)
Total Bilirubin: 0.4 mg/dL (ref 0.3–1.2)
Total Protein: 6.9 g/dL (ref 6.5–8.1)

## 2019-06-09 LAB — CBC WITH DIFFERENTIAL/PLATELET
Abs Immature Granulocytes: 0.17 10*3/uL — ABNORMAL HIGH (ref 0.00–0.07)
Basophils Absolute: 0.1 10*3/uL (ref 0.0–0.1)
Basophils Relative: 1 %
Eosinophils Absolute: 0.2 10*3/uL (ref 0.0–0.5)
Eosinophils Relative: 2 %
HCT: 38 % (ref 36.0–46.0)
Hemoglobin: 12.3 g/dL (ref 12.0–15.0)
Immature Granulocytes: 2 %
Lymphocytes Relative: 16 %
Lymphs Abs: 1.4 10*3/uL (ref 0.7–4.0)
MCH: 28.6 pg (ref 26.0–34.0)
MCHC: 32.4 g/dL (ref 30.0–36.0)
MCV: 88.4 fL (ref 80.0–100.0)
Monocytes Absolute: 0.9 10*3/uL (ref 0.1–1.0)
Monocytes Relative: 11 %
Neutro Abs: 5.6 10*3/uL (ref 1.7–7.7)
Neutrophils Relative %: 68 %
Platelets: 266 10*3/uL (ref 150–400)
RBC: 4.3 MIL/uL (ref 3.87–5.11)
RDW: 14.1 % (ref 11.5–15.5)
WBC: 8.3 10*3/uL (ref 4.0–10.5)
nRBC: 0 % (ref 0.0–0.2)

## 2019-06-09 MED ORDER — SODIUM CHLORIDE 0.9 % IV SOLN
240.0000 mg | Freq: Once | INTRAVENOUS | Status: AC
  Administered 2019-06-09: 240 mg via INTRAVENOUS
  Filled 2019-06-09: qty 24

## 2019-06-09 MED ORDER — HEPARIN SOD (PORK) LOCK FLUSH 100 UNIT/ML IV SOLN
500.0000 [IU] | Freq: Once | INTRAVENOUS | Status: AC | PRN
  Administered 2019-06-09: 500 [IU]
  Filled 2019-06-09: qty 5

## 2019-06-09 MED ORDER — SODIUM CHLORIDE 0.9% FLUSH
10.0000 mL | Freq: Once | INTRAVENOUS | Status: AC
  Administered 2019-06-09: 10 mL
  Filled 2019-06-09: qty 10

## 2019-06-09 MED ORDER — SODIUM CHLORIDE 0.9 % IV SOLN
Freq: Once | INTRAVENOUS | Status: AC
  Filled 2019-06-09: qty 250

## 2019-06-09 MED ORDER — SODIUM CHLORIDE 0.9% FLUSH
10.0000 mL | INTRAVENOUS | Status: DC | PRN
  Administered 2019-06-09: 10 mL
  Filled 2019-06-09: qty 10

## 2019-06-09 NOTE — Patient Instructions (Signed)
[facility] Discharge Instructions for Patients Receiving Chemotherapy  Today you received the following chemotherapy agents:  Nivolumab, Denosumab  To help prevent nausea and vomiting after your treatment, we encourage you to take your nausea medication as prescribed.   If you develop nausea and vomiting that is not controlled by your nausea medication, call the clinic.   BELOW ARE SYMPTOMS THAT SHOULD BE REPORTED IMMEDIATELY:  *FEVER GREATER THAN 100.5 F  *CHILLS WITH OR WITHOUT FEVER  NAUSEA AND VOMITING THAT IS NOT CONTROLLED WITH YOUR NAUSEA MEDICATION  *UNUSUAL SHORTNESS OF BREATH  *UNUSUAL BRUISING OR BLEEDING  TENDERNESS IN MOUTH AND THROAT WITH OR WITHOUT PRESENCE OF ULCERS  *URINARY PROBLEMS  *BOWEL PROBLEMS  UNUSUAL RASH Items with * indicate a potential emergency and should be followed up as soon as possible.  Feel free to call the clinic should you have any questions or concerns. The clinic phone number is [phone].  Please show the [place] at check-in to the Emergency Department and triage nurse.

## 2019-06-09 NOTE — Progress Notes (Signed)
Per Dr Irene Limbo OK for treatment today with CRT of 1.66

## 2019-06-15 ENCOUNTER — Other Ambulatory Visit: Payer: Self-pay | Admitting: *Deleted

## 2019-06-15 DIAGNOSIS — C642 Malignant neoplasm of left kidney, except renal pelvis: Secondary | ICD-10-CM

## 2019-06-15 DIAGNOSIS — E039 Hypothyroidism, unspecified: Secondary | ICD-10-CM

## 2019-06-15 DIAGNOSIS — Z5112 Encounter for antineoplastic immunotherapy: Secondary | ICD-10-CM

## 2019-06-22 NOTE — Progress Notes (Signed)
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 She is here for C34D1 of Nivolumab. The patient's last visit with [name] was on 05/27/2019. The pt reports that she is doing well overall.  The pt reports that she is still experiencing pain from her uterine prolapse and believes that it is getting worse. She is not aware of anything else that can be done to reverse her uterine prolapse besides surgery. Pt also has to have her bile stent removed, which she is currently discussing with her GI. She has a pinched nerve in her back which is also causing painful hemorrhoids.   Pt reports that she has continued taking Cortef as prescribed and her blood glucose levels have remained in the 200 range.   She has signed up for her first dose of the COVID19 vaccine but is unsure if she will take it.   Lab results today (06/23/19) of CBC w/diff and CMP is as follows: all values are WNL except for Abs Immature Granulocytes at 0.15K, CO2 at 21, Glucose at 206, BUN at 26, Creatinine at 1.53, Calcium at 7.9, Total Protein at 6.3, Albumin at 3.4, AST at 13, GFR Est Non Af Am at 32. 06/23/2019 TSH at 1.505  On review of systems, pt reports back pain, tightness/pulling in pelvis, painful hemorrhoids and denies skin rashes, diarrhea, fevers, chills and any other symptoms.   MEDICAL HISTORY:   Past Medical History:  Diagnosis Date  . Anemia   . Arthritis    lower back, hips, hands  . Biliary stricture   . Diabetes mellitus ([place])    type 2 - no meds, diet  controlled  . Early cataracts, bilateral   . Elevated liver enzymes   . GERD (gastroesophageal reflux disease)    occasional - diet controlled  . History of hiatal hernia   . HTN (hypertension)   . Hyperlipidemia   . Hypothyroidism   . left renal ca dx'd 2018   renal cancer - left kidney removed, pill chemo x 1 yr  . Myocardial infarction ([place]) 1991   no deficits  . SVD (spontaneous vaginal delivery)    x 3  . Wears glasses    Dyslipidemia Osteoarthritis Ex-smoker Coronary artery disease Thyroid disorder-was apparently on levothyroxine 25 g daily which has subsequently been discontinued . Mitral regurgitation B12 deficiency  hiatal hernia with esophagitis   SURGICAL HISTORY: Past Surgical History:  Procedure Laterality Date  . BALLOON DILATION N/A 07/23/2018   Procedure: BALLOON DILATION;  Surgeon: Rush Landmark Telford Nab., MD;  Location: [place];  Service: Gastroenterology;  Laterality: N/A;  . BILIARY BRUSHING  08/10/2018   Procedure: BILIARY BRUSHING;  Surgeon: Rush Landmark Telford Nab., MD;  Location: [place];  Service: Gastroenterology;;  . BILIARY BRUSHING  11/30/2018   Procedure: BILIARY BRUSHING;  Surgeon: Irving Copas., MD;  Location: [place];  Service: Gastroenterology;;  . BILIARY DILATION  08/10/2018   Procedure: BILIARY DILATION;  Surgeon: Irving Copas., MD;  Location: [place];  Service: Gastroenterology;;  . BILIARY DILATION  08/27/2018  Procedure: BILIARY DILATION;  Surgeon: Rush Landmark Telford Nab., MD;  Location: [place];  Service: Gastroenterology;;  . BILIARY STENT PLACEMENT  08/10/2018   Procedure: BILIARY STENT PLACEMENT;  Surgeon: Irving Copas., MD;  Location: [place];  Service: Gastroenterology;;  . BILIARY STENT PLACEMENT  08/27/2018   Procedure: BILIARY STENT PLACEMENT;  Surgeon: Irving Copas., MD;  Location: [place];  Service: Gastroenterology;;  . BILIARY STENT PLACEMENT  11/30/2018    Procedure: BILIARY STENT PLACEMENT;  Surgeon: Irving Copas., MD;  Location: [place];  Service: Gastroenterology;;  . BIOPSY  07/23/2018   Procedure: BIOPSY;  Surgeon: Irving Copas., MD;  Location: [place];  Service: Gastroenterology;;  . CHOLECYSTECTOMY N/A 09/02/2018   Procedure: LAPAROSCOPIC CHOLECYSTECTOMY;  Surgeon: Stark Klein, MD;  Location: [place];  Service: General;  Laterality: N/A;  . COLONOSCOPY     normal   . ENDOSCOPIC RETROGRADE CHOLANGIOPANCREATOGRAPHY (ERCP) WITH PROPOFOL N/A 08/10/2018   Procedure: ENDOSCOPIC RETROGRADE CHOLANGIOPANCREATOGRAPHY (ERCP) WITH PROPOFOL;  Surgeon: Irving Copas., MD;  Location: [place];  Service: Gastroenterology;  Laterality: N/A;  . ENDOSCOPIC RETROGRADE CHOLANGIOPANCREATOGRAPHY (ERCP) WITH PROPOFOL N/A 11/30/2018   Procedure: ENDOSCOPIC RETROGRADE CHOLANGIOPANCREATOGRAPHY (ERCP) WITH PROPOFOL;  Surgeon: Rush Landmark Telford Nab., MD;  Location: [place];  Service: Gastroenterology;  Laterality: N/A;  . ERCP N/A 07/23/2018   Procedure: ENDOSCOPIC RETROGRADE CHOLANGIOPANCREATOGRAPHY (ERCP);  Surgeon: Irving Copas., MD;  Location: [place];  Service: Gastroenterology;  Laterality: N/A;  . ERCP N/A 08/27/2018   Procedure: ENDOSCOPIC RETROGRADE CHOLANGIOPANCREATOGRAPHY (ERCP);  Surgeon: Irving Copas., MD;  Location: [place];  Service: Gastroenterology;  Laterality: N/A;  . ESOPHAGOGASTRODUODENOSCOPY (EGD) WITH PROPOFOL N/A 08/27/2018   Procedure: ESOPHAGOGASTRODUODENOSCOPY (EGD) WITH PROPOFOL;  Surgeon: Rush Landmark Telford Nab., MD;  Location: [place];  Service: Gastroenterology;  Laterality: N/A;  . EUS N/A 08/27/2018   Procedure: ESOPHAGEAL ENDOSCOPIC ULTRASOUND (EUS) RADIAL;  Surgeon: Rush Landmark Telford Nab., MD;  Location: [place];  Service: Gastroenterology;  Laterality: N/A;  . FINE NEEDLE ASPIRATION  08/27/2018   Procedure: FINE NEEDLE ASPIRATION (FNA) LINEAR;  Surgeon:  Irving Copas., MD;  Location: [place];  Service: Gastroenterology;;  . IR IMAGING GUIDED PORT INSERTION  01/08/2018  . LAPAROSCOPIC NEPHRECTOMY Left 08/01/2016   Procedure: LAPAROSCOPIC  RADICAL NEPHRECTOMY/ REPAIR OF UMBILICAL HERNIA;  Surgeon: Raynelle Bring, MD;  Location: [facility];  Service: Urology;  Laterality: Left;  . REMOVAL OF STONES  07/23/2018   Procedure: REMOVAL OF GALL STONES;  Surgeon: Rush Landmark Telford Nab., MD;  Location: [place];  Service: Gastroenterology;;  . REMOVAL OF STONES  08/10/2018   Procedure: REMOVAL OF STONES;  Surgeon: Irving Copas., MD;  Location: [place];  Service: Gastroenterology;;  . REMOVAL OF STONES  08/27/2018   Procedure: REMOVAL OF STONES;  Surgeon: Irving Copas., MD;  Location: [place];  Service: Gastroenterology;;  . [name]  07/23/2018   Procedure: [name];  Surgeon: Irving Copas., MD;  Location: [place];  Service: Gastroenterology;;  . Lavell Islam REMOVAL  08/27/2018   Procedure: STENT REMOVAL;  Surgeon: Irving Copas., MD;  Location: [place];  Service: Gastroenterology;;  . Lavell Islam REMOVAL  11/30/2018   Procedure: STENT REMOVAL;  Surgeon: Irving Copas., MD;  Location: [place];  Service: Gastroenterology;;  . UPPER GI ENDOSCOPY     x 1   No reported past surgeries EGD 05/01/2016 Dr. Earley Brooke Colonoscopy 03/2016 Dr. Earley Brooke  SOCIAL HISTORY: Social History   Socioeconomic History  . Marital status: Married    Spouse name:  Not on file  . Number of children: 3  . Years of education: Not on file  . Highest education level: Not on file  Occupational History  . Occupation: retired  Tobacco Use  . Smoking status: Former Smoker    Packs/day: 1.00    Years: 8.00    Pack years: 8.00    Types: Cigarettes    Quit date: 06/18/1964    Years since quitting: 55.0  . Smokeless tobacco: Never Used  Substance and Sexual Activity  . Alcohol use: No  . Drug use: No  .  Sexual activity: Not on file  Other Topics Concern  . Not on file  Social History Narrative   Married.  Three children   Social Determinants of Health   Financial Resource Strain:   . Difficulty of Paying Living Expenses: Not on file  Food Insecurity:   . Worried About Charity fundraiser in the Last Year: Not on file  . Ran Out of Food in the Last Year: Not on file  Transportation Needs:   . Lack of Transportation (Medical): Not on file  . Lack of Transportation (Non-Medical): Not on file  Physical Activity:   . Days of Exercise per Week: Not on file  . Minutes of Exercise per Session: Not on file  Stress:   . Feeling of Stress : Not on file  Social Connections:   . Frequency of Communication with Friends and Family: Not on file  . Frequency of Social Gatherings with Friends and Family: Not on file  . Attends Religious Services: Not on file  . Active Member of Clubs or Organizations: Not on file  . Attends Archivist Meetings: Not on file  . Marital Status: Not on file  Intimate Partner Violence:   . Fear of Current or Ex-Partner: Not on file  . Emotionally Abused: Not on file  . Physically Abused: Not on file  . Sexually Abused: Not on file   Patient lives in [place] Works as a Solicitor part-time Ex-smoker quit long time ago In 1961 . Smoked 2 packs per day for 8 years prior to that .  or a lot of stress since her daughter is also getting treated for stage IV uterine cancer.  FAMILY HISTORY:  Mother deceased Father with asthma and emphysema died at 60 years with the MI. Brother deceased of heart disease   ALLERGIES:  is allergic to adhesive [tape]; gabapentin; nsaids; and statins.  MEDICATIONS:  Current Outpatient Medications  Medication Sig Dispense Refill  . amLODipine (NORVASC) 5 MG tablet Take 5 mg by mouth daily.   6  . atorvastatin (LIPITOR) 40 MG tablet Take 40 mg by mouth at bedtime.     . carvedilol (COREG) 6.25 MG tablet  Take 6.25 mg by mouth 2 (two) times daily.  6  . clobetasol ointment (TEMOVATE) [id number] % Apply 1 application topically 2 (two) times daily as needed (lichen sclerosis).     . Denosumab (XGEVA [place]) Inject into the skin every 6 (six) weeks.    Marland Kitchen glimepiride (AMARYL) 1 MG tablet Take 1 mg by mouth daily with breakfast.    . hydrocortisone (CORTEF) 10 MG tablet Take 5-10 mg by mouth See admin instructions. Take 1 tablet (10 mg) by mouth in the morning & 1/2 tablet (5 mg) by mouth in the evening.    Marland Kitchen levothyroxine (SYNTHROID) 75 MCG tablet Take 75 mcg by mouth daily before breakfast.     . lidocaine (LIDODERM) 5 %  Place 1 patch onto the skin daily. Remove & Discard patch within 12 hours or as directed by MD Using on Right Hip 30 patch 0  . lidocaine-prilocaine (EMLA) cream Apply 1 application topically daily as needed (prior to port access). 30 g 1  . Nivolumab (OPDIVO IV) Inject 1 Dose into the vein every 14 (fourteen) days. [place]    . omeprazole (PRILOSEC) 20 MG capsule Take 1 capsule by mouth once daily 60 capsule 0  . repaglinide (PRANDIN) 0.5 MG tablet Take 0.5 mg by mouth 2 (two) times daily before a meal.      No current facility-administered medications for this visit.   Facility-Administered Medications Ordered in Other Visits  Medication Dose Route Frequency Provider Last Rate Last Admin  . sodium chloride flush (NS) 0.9 % injection 10 mL  10 mL Intracatheter PRN Truitt Merle, MD   10 mL at 11/20/18 1232    REVIEW OF SYSTEMS:   A 10+ POINT REVIEW OF SYSTEMS WAS OBTAINED including neurology, dermatology, psychiatry, cardiac, respiratory, lymph, extremities, GI, GU, Musculoskeletal, constitutional, breasts, reproductive, HEENT.  All pertinent positives are noted in the HPI.  All others are negative.   PHYSICAL EXAMINATION:  ECOG FS:2 - Symptomatic, <50% confined to bed  Vitals:   06/23/19 0905  BP: (!) 144/62  Pulse: 71  Resp: 18  Temp: 98.7 F (37.1 C)  SpO2: 99%   Wt  Readings from Last 3 Encounters:  06/23/19 137 lb 11.2 oz (62.5 kg)  05/27/19 133 lb 11.2 oz (60.6 kg)  04/28/19 135 lb 6.4 oz (61.4 kg)   Body mass index is 27.81 kg/m.    GENERAL:alert, in no acute distress and comfortable SKIN: no acute rashes, no significant lesions EYES: conjunctiva are pink and non-injected, sclera anicteric OROPHARYNX: MMM, no exudates, no oropharyngeal erythema or ulceration NECK: supple, no JVD LYMPH:  no palpable lymphadenopathy in the cervical, axillary or inguinal regions LUNGS: clear to auscultation b/l with normal respiratory effort HEART: regular rate & rhythm ABDOMEN:  normoactive bowel sounds , non tender, not distended. No palpable hepatosplenomegaly.  Extremity: no pedal edema PSYCH: alert & oriented x 3 with fluent speech NEURO: no focal motor/sensory deficits  LABORATORY DATA:  I have reviewed the data as listed  . CBC Latest Ref Rng & Units 06/23/2019 06/09/2019 05/27/2019  WBC 4.0 - 10.5 K/uL 7.3 8.3 9.5  Hemoglobin 12.0 - 15.0 g/dL 12.0 12.3 12.6  Hematocrit 36.0 - 46.0 % 37.6 38.0 38.5  Platelets 150 - 400 K/uL 257 266 247    CBC    Component Value Date/Time   WBC 7.3 06/23/2019 0820   RBC 4.12 06/23/2019 0820   HGB 12.0 06/23/2019 0820   HGB 11.4 (L) 09/11/2018 0900   HGB 11.2 (L) 04/04/2017 0830   HCT 37.6 06/23/2019 0820   HCT 35.2 04/04/2017 0830   PLT 257 06/23/2019 0820   PLT 277 09/11/2018 0900   PLT 250 04/04/2017 0830   MCV 91.3 06/23/2019 0820   MCV 107.6 (H) 04/04/2017 0830   MCH 29.1 06/23/2019 0820   MCHC 31.9 06/23/2019 0820   RDW 14.5 06/23/2019 0820   RDW 14.0 04/04/2017 0830   LYMPHSABS 1.3 06/23/2019 0820   LYMPHSABS 1.6 04/04/2017 0830   MONOABS 0.8 06/23/2019 0820   MONOABS 0.4 04/04/2017 0830   EOSABS 0.2 06/23/2019 0820   EOSABS 0.1 04/04/2017 0830   BASOSABS 0.1 06/23/2019 0820   BASOSABS 0.0 04/04/2017 0830     . CMP Latest Ref  Rng & Units 06/23/2019 06/09/2019 05/27/2019  Glucose 70 - 99 mg/dL  206(H) 150(H) 171(H)  BUN 8 - 23 mg/dL 26(H) 29(H) 36(H)  Creatinine 0.44 - 1.00 mg/dL 1.53(H) 1.66(H) 1.63(H)  Sodium 135 - 145 mmol/L 139 140 134(L)  Potassium 3.5 - 5.1 mmol/L 4.5 4.6 4.9  Chloride 98 - 111 mmol/L 110 108 104  CO2 22 - 32 mmol/L 21(L) 22 21(L)  Calcium 8.9 - 10.3 mg/dL 7.9(L) 9.5 9.2  Total Protein 6.5 - 8.1 g/dL 6.3(L) 6.9 6.8  Total Bilirubin 0.3 - 1.2 mg/dL 0.3 0.4 0.5  Alkaline Phos 38 - 126 U/L 52 64 58  AST 15 - 41 U/L 13(L) 19 18  ALT 0 - 44 U/L 24 19 17    B12 level 299 (OSH)-->455  . Lab Results  Component Value Date   LDH 136 03/13/2018        08/27/18 Surgical Pathology from Cholecystectomy:         RADIOGRAPHIC STUDIES: I have personally reviewed the radiological images as listed and agreed with the findings in the report. Nm Pet Image Restag (ps) Skull Base To Thigh  Result Date: 01/03/2017 IMPRESSION: 1. There is a new lesion in the hepatic dome which is FDG avid and low in attenuation on CT imaging consistent with metastatic disease. Another region of uptake in the left hepatic lobe posteriorly demonstrates no CT correlate. A second subtle metastasis is not excluded on today's study. An MRI could better assess for other hepatic metastases if clinically warranted. 2. New metastatic lesion in the left side of T8. 3. New pulmonary nodule in the right upper lobe. This is too small to characterize but suspicious. Recommend attention on follow-up. No FDG avid nodules or other enlarging nodules. 4. The uptake at the previous left renal artery has almost resolved in the interval and is favored to be post therapeutic. Recommend attention on follow-up. 5. The metastasis in the posterior right hilum seen previously has resolved. Electronically Signed   By: Dorise Bullion III M.D   On: 01/03/2017 11:16   .No results found.  ASSESSMENT & PLAN:    79 y.o. 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 PET/CT 06/19/2017 -- consistent with partial metabolic response to treatment.  Rt adrenal gland bx - showed clear cell RCC  S/p CYtoreductive left radical nephrectomy and left adrenal gland resection on 08/01/2016 by Dr Alinda Money.  10/16/17 PET/CT revealed Continued improvement, with the T7 metastatic lesion no longer significantly hypermetabolic. The previous right lower lobe pulmonary nodule is even less apparent, perhaps about 2 mm in diameter today; given that this measured 6 mm on 03/28/2017 this probably represents an effectively treated metastatic lesion. Currently no appreciable hypermetabolic activity is identified to suggest active malignancy. Distended gallbladder with gallbladder wall thickening and gallstones. Correlate clinically in assessing for cholecystitis. Small but abnormal amount of free pelvic fluid, nonspecific. Other imaging findings of potential clinical significance: Chronic ethmoid sinusitis. Aortic Atherosclerosis. Stable 5 mm right middle lobe pulmonary nodule, not hypermetabolic but below sensitive PET-CT size thresholds. Left nephrectomy. Notable pelvic floor laxity with cystocele. Chronic bilateral Sacroiliitis.  04/03/18 PET/CT revealed No evidence for new or progressive hypermetabolic disease on today's study to suggest metastatic progression. 2. Stable appearance of the T7 metastatic lesion without Hypermetabolism. 3. Tiny focus of FDG accumulation identified along the skin of the low right inguinal fold. No associated lesion  evident on CT. This may be related to urinary contaminant. 4. Cholelithiasis with similar appearance of diffuse gallbladder wall thickening. 5. Stable 5 mm right middle lobe pulmonary nodule. 6.  Aortic Atherosclerois. 7. Diffuse colonic diverticulosis.   09/18/18 CT A/P revealed "Common duct stent in place. Improvement to resolution in biliary duct dilatation compared to  08/29/2018. Interval cholecystectomy without acute complication. 2. Left nephrectomy, without evidence of metastatic disease. 3.  Tiny hiatal hernia. 4. Coronary artery atherosclerosis. Aortic Atherosclerosis. 5. Mild limitations secondary to lack of IV contrast. 6. Marked pelvic floor laxity with cystocele and rectal prolapse".  #2 b/l adrenal metastases from [place] s/p left adrenalectomy with adrenal insuff - follows with Dr Buddy Duty.  #3 Small pulmonary lesions -- 10/16/17 PET/CT showed pulmonary less apparent than 03/28/17 PET/CT, decreasing from 53mm to 22mm diameter.   MRI brain shows no evidence of metastatic disease  #4 T7/8 Bone metastases - received Xgeva every 4 weeks from May 2018 to June 2019. 10/16/17 PET/CT revealed improvement with T7 metastatic lesion no longer significantly hypermetabolic.  -on Marchelle Folks  #5 ?liver mets- rpt PET/CT from 10/16/2017 shows no overt evidence of metastatic disease in the liver.  #6 Grade 1 Nausea - improved and intermittent. Hasnt used her anti-emetic as instructed so some nausea and decreased po intake.  #7 Grade 1 Diarrhea - resolved  #8 Hyponatremia -  resolved with sodium at 136 - likely related to some element of adrenal insufficiency, diarrhea, limited by mouth intake. Primarily solute free fluid intake.   #9 Acute on chronic renal insufficiency Creatine stable 1.56  #10 Hyperkalemia due to ACEI + RF- resolved  #11 Hemorrhoids -chronic with some bleeding --Recommended Sitz bath and OTC Anusol or Nupercaine for her hemorrhoid relief. -f/u with PCP for continued mx   #12 Moderate protein calorie malnutrition Weight has stabilized and improved Wt Readings from Last 3 Encounters:  06/23/19 137 lb 11.2 oz (62.5 kg)  05/27/19 133 lb 11.2 oz (60.6 kg)  04/28/19 135 lb 6.4 oz (61.4 kg)   Plan: Continue healthy po intake/diabetic diet -Previously recommended the patient to drink atleast 48-64 oz of fluids daily -f/u with PCP/Cardiology for  diuretics management  #13 Hypothyroidism/Adrenal insufficiency/Diabetes -Continue being followed by Dr. Buddy Duty  #14 HTN - ?control. Patient tends to be anxious and has higher blood pressures in the clinic. She can have increased blood pressure from Sutent as well. Plan: -continued close f/u with her PCP /cardiology regarding the many elements necessary for her care that are not directly related to her oncology care. -ACEI held due to AKI and hyperkalemia - following with cardiology to mx this. Has been started on Amlodipine instead.  #15 Grade 1 mucositis- resolved -Advised the patient to continue use of Magic Mouthwash to aid with nutrition  #16 Newly diagnosed ?CAD - positive cardiac nuclear stress test Following with cardiology and nephrology ? Need for pre-operative cardiac cath  #17 Ch/o holedocholithiasis and cholelithiasis 07/10/18 US Abdomen revealed Biliary duct dilatation with 8 mm calculus at the level of the ampulla in the distal common bile duct. 2. Cholelithiasis. No gallbladder wall thickening or pericholecystic fluid. 3. Appearance of the liver raises concern for underlying hepatic cirrhosis. No focal liver lesions are demonstrable. 4.  Left kidney absent. 5.  Small cysts in right kidney. 6.  Aortic Atherosclerosis.  S/p ERCP on 07/23/18 with Dr. Valarie Merino Mansouraty  09/02/18 Cytopathology from ERCP was suspicious for malignant cells in bile duct, but was not definitive, other two findings were benign.  PLAN:  -Discussed pt labwork today, 06/23/19; blood counts and chemistries are stable -Discussed 06/23/2019 TSH is WNL -The pt has no prohibitive toxicities from continuing C34D1 of Nivolumab at this time -Discussed that the risks and benefits of uterine prolapse surgery would need to be weighed against her other medical considerations  -Advised pt that if she goes forward with surgery we would need to hold her immunotherapy for a time -Pt appears to be aware of the risks  associated with surgery and would like to proceed -Advised pt that it would not be unreasonable to take an OTC Vitamin B-12  -Advised pt that she has no contraindications from my standpoint for her to take a baby ASA daily -Recommend pt receive the COVID19 vaccine when available  -Will discuss an OBGYN referral at next visit -Continue Xgeva to every 6 weeks  -Recommend pt continue to f/u with Dr. Percival Spanish for surgery clearance and medication discussion -Will get PET/CT in 1 week -Will see back in 4 weeks with labs   FOLLOW UP: PET/CT in 1 week Continue Nivolumab q2weeks with labs - plz schedule next 4 doses MD visit in 4 weeks   All of the patient's questions were answered with apparent satisfaction. The patient knows to call the clinic with any problems, questions or concerns.  Sullivan Lone MD [place] AAHIVMS [facility] Hematology/Oncology Physician [facility]  (Office):       [phone] (Work cell):  [phone] (Fax):           [phone]  I, Yevette Edwards, am acting as a scribe for Dr. Sullivan Lone.   .I have reviewed the above documentation for accuracy and completeness, and I agree with the above. Brunetta Genera MD

## 2019-06-23 ENCOUNTER — Ambulatory Visit: Payer: 59

## 2019-06-23 ENCOUNTER — Inpatient Hospital Stay (HOSPITAL_BASED_OUTPATIENT_CLINIC_OR_DEPARTMENT_OTHER): Payer: Medicare Other | Admitting: Hematology

## 2019-06-23 ENCOUNTER — Other Ambulatory Visit: Payer: Self-pay

## 2019-06-23 ENCOUNTER — Other Ambulatory Visit: Payer: 59

## 2019-06-23 ENCOUNTER — Inpatient Hospital Stay: Payer: Medicare Other

## 2019-06-23 ENCOUNTER — Inpatient Hospital Stay: Payer: Medicare Other | Attending: Hematology

## 2019-06-23 ENCOUNTER — Ambulatory Visit: Payer: 59 | Admitting: Hematology

## 2019-06-23 VITALS — BP 144/62 | HR 71 | Temp 98.7°F | Resp 18 | Ht 59.0 in | Wt 137.7 lb

## 2019-06-23 DIAGNOSIS — E871 Hypo-osmolality and hyponatremia: Secondary | ICD-10-CM | POA: Diagnosis not present

## 2019-06-23 DIAGNOSIS — C7971 Secondary malignant neoplasm of right adrenal gland: Secondary | ICD-10-CM | POA: Diagnosis not present

## 2019-06-23 DIAGNOSIS — D649 Anemia, unspecified: Secondary | ICD-10-CM | POA: Diagnosis not present

## 2019-06-23 DIAGNOSIS — M129 Arthropathy, unspecified: Secondary | ICD-10-CM | POA: Diagnosis not present

## 2019-06-23 DIAGNOSIS — C649 Malignant neoplasm of unspecified kidney, except renal pelvis: Secondary | ICD-10-CM | POA: Insufficient documentation

## 2019-06-23 DIAGNOSIS — E119 Type 2 diabetes mellitus without complications: Secondary | ICD-10-CM | POA: Insufficient documentation

## 2019-06-23 DIAGNOSIS — C7951 Secondary malignant neoplasm of bone: Secondary | ICD-10-CM

## 2019-06-23 DIAGNOSIS — E1122 Type 2 diabetes mellitus with diabetic chronic kidney disease: Secondary | ICD-10-CM | POA: Insufficient documentation

## 2019-06-23 DIAGNOSIS — E785 Hyperlipidemia, unspecified: Secondary | ICD-10-CM | POA: Diagnosis not present

## 2019-06-23 DIAGNOSIS — E538 Deficiency of other specified B group vitamins: Secondary | ICD-10-CM | POA: Insufficient documentation

## 2019-06-23 DIAGNOSIS — I340 Nonrheumatic mitral (valve) insufficiency: Secondary | ICD-10-CM | POA: Diagnosis not present

## 2019-06-23 DIAGNOSIS — Z905 Acquired absence of kidney: Secondary | ICD-10-CM | POA: Insufficient documentation

## 2019-06-23 DIAGNOSIS — C7972 Secondary malignant neoplasm of left adrenal gland: Secondary | ICD-10-CM | POA: Diagnosis not present

## 2019-06-23 DIAGNOSIS — Z95828 Presence of other vascular implants and grafts: Secondary | ICD-10-CM

## 2019-06-23 DIAGNOSIS — Z7984 Long term (current) use of oral hypoglycemic drugs: Secondary | ICD-10-CM | POA: Diagnosis not present

## 2019-06-23 DIAGNOSIS — Z5112 Encounter for antineoplastic immunotherapy: Secondary | ICD-10-CM | POA: Diagnosis present

## 2019-06-23 DIAGNOSIS — E039 Hypothyroidism, unspecified: Secondary | ICD-10-CM | POA: Diagnosis not present

## 2019-06-23 DIAGNOSIS — R102 Pelvic and perineal pain: Secondary | ICD-10-CM | POA: Insufficient documentation

## 2019-06-23 DIAGNOSIS — C642 Malignant neoplasm of left kidney, except renal pelvis: Secondary | ICD-10-CM

## 2019-06-23 DIAGNOSIS — M549 Dorsalgia, unspecified: Secondary | ICD-10-CM | POA: Diagnosis not present

## 2019-06-23 DIAGNOSIS — K649 Unspecified hemorrhoids: Secondary | ICD-10-CM | POA: Insufficient documentation

## 2019-06-23 DIAGNOSIS — I2510 Atherosclerotic heart disease of native coronary artery without angina pectoris: Secondary | ICD-10-CM | POA: Insufficient documentation

## 2019-06-23 DIAGNOSIS — Z79899 Other long term (current) drug therapy: Secondary | ICD-10-CM | POA: Insufficient documentation

## 2019-06-23 DIAGNOSIS — C7800 Secondary malignant neoplasm of unspecified lung: Secondary | ICD-10-CM | POA: Insufficient documentation

## 2019-06-23 DIAGNOSIS — I10 Essential (primary) hypertension: Secondary | ICD-10-CM | POA: Insufficient documentation

## 2019-06-23 DIAGNOSIS — Z87891 Personal history of nicotine dependence: Secondary | ICD-10-CM | POA: Diagnosis not present

## 2019-06-23 DIAGNOSIS — I252 Old myocardial infarction: Secondary | ICD-10-CM | POA: Diagnosis not present

## 2019-06-23 DIAGNOSIS — M1990 Unspecified osteoarthritis, unspecified site: Secondary | ICD-10-CM | POA: Diagnosis not present

## 2019-06-23 DIAGNOSIS — E1136 Type 2 diabetes mellitus with diabetic cataract: Secondary | ICD-10-CM | POA: Insufficient documentation

## 2019-06-23 DIAGNOSIS — Z7189 Other specified counseling: Secondary | ICD-10-CM

## 2019-06-23 DIAGNOSIS — N189 Chronic kidney disease, unspecified: Secondary | ICD-10-CM | POA: Insufficient documentation

## 2019-06-23 DIAGNOSIS — E440 Moderate protein-calorie malnutrition: Secondary | ICD-10-CM | POA: Insufficient documentation

## 2019-06-23 DIAGNOSIS — I129 Hypertensive chronic kidney disease with stage 1 through stage 4 chronic kidney disease, or unspecified chronic kidney disease: Secondary | ICD-10-CM | POA: Insufficient documentation

## 2019-06-23 LAB — CMP (CANCER CENTER ONLY)
ALT: 24 U/L (ref 0–44)
AST: 13 U/L — ABNORMAL LOW (ref 15–41)
Albumin: 3.4 g/dL — ABNORMAL LOW (ref 3.5–5.0)
Alkaline Phosphatase: 52 U/L (ref 38–126)
Anion gap: 8 (ref 5–15)
BUN: 26 mg/dL — ABNORMAL HIGH (ref 8–23)
CO2: 21 mmol/L — ABNORMAL LOW (ref 22–32)
Calcium: 7.9 mg/dL — ABNORMAL LOW (ref 8.9–10.3)
Chloride: 110 mmol/L (ref 98–111)
Creatinine: 1.53 mg/dL — ABNORMAL HIGH (ref 0.44–1.00)
GFR, Est AFR Am: 38 mL/min — ABNORMAL LOW (ref >60)
GFR, Estimated: 32 mL/min — ABNORMAL LOW (ref >60)
Glucose, Bld: 206 mg/dL — ABNORMAL HIGH (ref 70–99)
Potassium: 4.5 mmol/L (ref 3.5–5.1)
Sodium: 139 mmol/L (ref 135–145)
Total Bilirubin: 0.3 mg/dL (ref 0.3–1.2)
Total Protein: 6.3 g/dL — ABNORMAL LOW (ref 6.5–8.1)

## 2019-06-23 LAB — TSH: TSH: 1.505 u[IU]/mL (ref 0.308–3.960)

## 2019-06-23 LAB — CBC WITH DIFFERENTIAL/PLATELET
Abs Immature Granulocytes: 0.15 10*3/uL — ABNORMAL HIGH (ref 0.00–0.07)
Basophils Absolute: 0.1 10*3/uL (ref 0.0–0.1)
Basophils Relative: 1 %
Eosinophils Absolute: 0.2 10*3/uL (ref 0.0–0.5)
Eosinophils Relative: 2 %
HCT: 37.6 % (ref 36.0–46.0)
Hemoglobin: 12 g/dL (ref 12.0–15.0)
Immature Granulocytes: 2 %
Lymphocytes Relative: 18 %
Lymphs Abs: 1.3 10*3/uL (ref 0.7–4.0)
MCH: 29.1 pg (ref 26.0–34.0)
MCHC: 31.9 g/dL (ref 30.0–36.0)
MCV: 91.3 fL (ref 80.0–100.0)
Monocytes Absolute: 0.8 10*3/uL (ref 0.1–1.0)
Monocytes Relative: 11 %
Neutro Abs: 4.8 10*3/uL (ref 1.7–7.7)
Neutrophils Relative %: 66 %
Platelets: 257 10*3/uL (ref 150–400)
RBC: 4.12 MIL/uL (ref 3.87–5.11)
RDW: 14.5 % (ref 11.5–15.5)
WBC: 7.3 10*3/uL (ref 4.0–10.5)
nRBC: 0 % (ref 0.0–0.2)

## 2019-06-23 MED ORDER — SODIUM CHLORIDE 0.9 % IV SOLN
240.0000 mg | Freq: Once | INTRAVENOUS | Status: AC
  Administered 2019-06-23: 240 mg via INTRAVENOUS
  Filled 2019-06-23: qty 24

## 2019-06-23 MED ORDER — DENOSUMAB 120 MG/1.7ML ~~LOC~~ SOLN
120.0000 mg | Freq: Once | SUBCUTANEOUS | Status: AC
  Administered 2019-06-23: 120 mg via SUBCUTANEOUS

## 2019-06-23 MED ORDER — SODIUM CHLORIDE 0.9 % IV SOLN
Freq: Once | INTRAVENOUS | Status: AC
  Filled 2019-06-23: qty 250

## 2019-06-23 MED ORDER — SODIUM CHLORIDE 0.9% FLUSH
10.0000 mL | Freq: Once | INTRAVENOUS | Status: AC
  Administered 2019-06-23: 10 mL
  Filled 2019-06-23: qty 10

## 2019-06-23 MED ORDER — DENOSUMAB 120 MG/1.7ML ~~LOC~~ SOLN
SUBCUTANEOUS | Status: AC
  Filled 2019-06-23: qty 1.7

## 2019-06-23 MED ORDER — HEPARIN SOD (PORK) LOCK FLUSH 100 UNIT/ML IV SOLN
500.0000 [IU] | Freq: Once | INTRAVENOUS | Status: AC | PRN
  Administered 2019-06-23: 500 [IU]
  Filled 2019-06-23: qty 5

## 2019-06-23 MED ORDER — SODIUM CHLORIDE 0.9% FLUSH
10.0000 mL | INTRAVENOUS | Status: DC | PRN
  Administered 2019-06-23: 10 mL
  Filled 2019-06-23: qty 10

## 2019-06-23 NOTE — Progress Notes (Signed)
Verbal order/[name]: Ok to receive Nivolumab today with Creatinine 1.53 Verbal order/[name]: Ok to receive Delton See today with Calcium (corrected) 8.3

## 2019-06-23 NOTE — Patient Instructions (Signed)
[facility] Discharge Instructions for Patients Receiving Chemotherapy  Today you received the following chemotherapy agents :  Nivolumab,  Xgeva.  To help prevent nausea and vomiting after your treatment, we encourage you to take your nausea medication as prescribed.   If you develop nausea and vomiting that is not controlled by your nausea medication, call the clinic.   BELOW ARE SYMPTOMS THAT SHOULD BE REPORTED IMMEDIATELY:  *FEVER GREATER THAN 100.5 F  *CHILLS WITH OR WITHOUT FEVER  NAUSEA AND VOMITING THAT IS NOT CONTROLLED WITH YOUR NAUSEA MEDICATION  *UNUSUAL SHORTNESS OF BREATH  *UNUSUAL BRUISING OR BLEEDING  TENDERNESS IN MOUTH AND THROAT WITH OR WITHOUT PRESENCE OF ULCERS  *URINARY PROBLEMS  *BOWEL PROBLEMS  UNUSUAL RASH Items with * indicate a potential emergency and should be followed up as soon as possible.  Feel free to call the clinic should you have any questions or concerns. The clinic phone number is [phone].  Please show the CHEMO ALERT CARD at check-in to the Emergency Department and triage nurse.   

## 2019-06-25 ENCOUNTER — Telehealth: Payer: Self-pay | Admitting: Hematology

## 2019-06-25 NOTE — Telephone Encounter (Signed)
Scheduled per 03/03 los, patient has been called and notified.  

## 2019-06-26 ENCOUNTER — Other Ambulatory Visit: Payer: Self-pay | Admitting: Gastroenterology

## 2019-06-30 ENCOUNTER — Telehealth: Payer: Self-pay

## 2019-06-30 NOTE — Telephone Encounter (Signed)
Received call from patient wanting to know if it is okay for her to get her COVID shot this afternoon and it would not interfare with het PET scan on Friday and her immunotherapy next week?  Per [name]: will not interfere with immunotherapy unless significant reaction. can cause from local /axillary reactive LNadenopathy from vaccine.  Encouraged her to give [name] a call with any further concerns. Patient verbalized understanding.

## 2019-07-02 ENCOUNTER — Other Ambulatory Visit: Payer: Self-pay

## 2019-07-02 ENCOUNTER — Encounter (HOSPITAL_COMMUNITY)
Admission: RE | Admit: 2019-07-02 | Discharge: 2019-07-02 | Disposition: A | Discharge status: Home / Self Care | Payer: Medicare Other | Source: Ambulatory Visit | Attending: Hematology | Admitting: Hematology

## 2019-07-02 DIAGNOSIS — C642 Malignant neoplasm of left kidney, except renal pelvis: Secondary | ICD-10-CM

## 2019-07-02 DIAGNOSIS — C7951 Secondary malignant neoplasm of bone: Secondary | ICD-10-CM

## 2019-07-02 DIAGNOSIS — I2510 Atherosclerotic heart disease of native coronary artery without angina pectoris: Secondary | ICD-10-CM | POA: Insufficient documentation

## 2019-07-02 DIAGNOSIS — I700 Atherosclerosis of aorta: Secondary | ICD-10-CM | POA: Insufficient documentation

## 2019-07-02 LAB — GLUCOSE, CAPILLARY: Glucose-Capillary: 158 mg/dL — ABNORMAL HIGH (ref 70–99)

## 2019-07-02 MED ORDER — FLUDEOXYGLUCOSE F - 18 (FDG) INJECTION
7.4000 | Freq: Once | INTRAVENOUS | Status: AC | PRN
  Administered 2019-07-02: 7.4 via INTRAVENOUS

## 2019-07-07 ENCOUNTER — Inpatient Hospital Stay: Payer: Medicare Other

## 2019-07-07 ENCOUNTER — Other Ambulatory Visit: Payer: Self-pay

## 2019-07-07 VITALS — BP 153/71 | HR 74 | Temp 98.2°F | Resp 20 | Ht 59.0 in | Wt 135.8 lb

## 2019-07-07 DIAGNOSIS — Z5112 Encounter for antineoplastic immunotherapy: Secondary | ICD-10-CM

## 2019-07-07 DIAGNOSIS — C642 Malignant neoplasm of left kidney, except renal pelvis: Secondary | ICD-10-CM

## 2019-07-07 DIAGNOSIS — C7951 Secondary malignant neoplasm of bone: Secondary | ICD-10-CM

## 2019-07-07 DIAGNOSIS — Z95828 Presence of other vascular implants and grafts: Secondary | ICD-10-CM

## 2019-07-07 DIAGNOSIS — N390 Urinary tract infection, site not specified: Secondary | ICD-10-CM

## 2019-07-07 DIAGNOSIS — Z7189 Other specified counseling: Secondary | ICD-10-CM

## 2019-07-07 LAB — CBC WITH DIFFERENTIAL/PLATELET
Abs Immature Granulocytes: 0.12 10*3/uL — ABNORMAL HIGH (ref 0.00–0.07)
Basophils Absolute: 0.1 10*3/uL (ref 0.0–0.1)
Basophils Relative: 1 %
Eosinophils Absolute: 0.2 10*3/uL (ref 0.0–0.5)
Eosinophils Relative: 3 %
HCT: 39.5 % (ref 36.0–46.0)
Hemoglobin: 12.7 g/dL (ref 12.0–15.0)
Immature Granulocytes: 2 %
Lymphocytes Relative: 20 %
Lymphs Abs: 1.5 10*3/uL (ref 0.7–4.0)
MCH: 28.9 pg (ref 26.0–34.0)
MCHC: 32.2 g/dL (ref 30.0–36.0)
MCV: 90 fL (ref 80.0–100.0)
Monocytes Absolute: 1 10*3/uL (ref 0.1–1.0)
Monocytes Relative: 13 %
Neutro Abs: 4.4 10*3/uL (ref 1.7–7.7)
Neutrophils Relative %: 61 %
Platelets: 282 10*3/uL (ref 150–400)
RBC: 4.39 MIL/uL (ref 3.87–5.11)
RDW: 13.7 % (ref 11.5–15.5)
WBC: 7.3 10*3/uL (ref 4.0–10.5)
nRBC: 0 % (ref 0.0–0.2)

## 2019-07-07 LAB — URINALYSIS, COMPLETE (UACMP) WITH MICROSCOPIC
Bilirubin Urine: NEGATIVE
Glucose, UA: 150 mg/dL — AB
Ketones, ur: NEGATIVE mg/dL
Nitrite: NEGATIVE
Protein, ur: NEGATIVE mg/dL
Specific Gravity, Urine: 1.01 (ref 1.005–1.030)
WBC, UA: >50 WBC/hpf — ABNORMAL HIGH (ref 0–5)
pH: 5 (ref 5.0–8.0)

## 2019-07-07 LAB — CMP (CANCER CENTER ONLY)
ALT: 12 U/L (ref 0–44)
AST: 13 U/L — ABNORMAL LOW (ref 15–41)
Albumin: 3.7 g/dL (ref 3.5–5.0)
Alkaline Phosphatase: 59 U/L (ref 38–126)
Anion gap: 12 (ref 5–15)
BUN: 30 mg/dL — ABNORMAL HIGH (ref 8–23)
CO2: 20 mmol/L — ABNORMAL LOW (ref 22–32)
Calcium: 9.2 mg/dL (ref 8.9–10.3)
Chloride: 106 mmol/L (ref 98–111)
Creatinine: 1.77 mg/dL — ABNORMAL HIGH (ref 0.44–1.00)
GFR, Est AFR Am: 32 mL/min — ABNORMAL LOW (ref >60)
GFR, Estimated: 27 mL/min — ABNORMAL LOW (ref >60)
Glucose, Bld: 192 mg/dL — ABNORMAL HIGH (ref 70–99)
Potassium: 4.3 mmol/L (ref 3.5–5.1)
Sodium: 138 mmol/L (ref 135–145)
Total Bilirubin: 0.4 mg/dL (ref 0.3–1.2)
Total Protein: 6.8 g/dL (ref 6.5–8.1)

## 2019-07-07 LAB — LACTATE DEHYDROGENASE: LDH: 109 U/L (ref 98–192)

## 2019-07-07 MED ORDER — SODIUM CHLORIDE 0.9 % IV SOLN
Freq: Once | INTRAVENOUS | Status: AC
  Filled 2019-07-07: qty 250

## 2019-07-07 MED ORDER — SODIUM CHLORIDE 0.9% FLUSH
10.0000 mL | Freq: Once | INTRAVENOUS | Status: AC
  Administered 2019-07-07: 10 mL
  Filled 2019-07-07: qty 10

## 2019-07-07 MED ORDER — SODIUM CHLORIDE 0.9 % IV SOLN
240.0000 mg | Freq: Once | INTRAVENOUS | Status: AC
  Administered 2019-07-07: 240 mg via INTRAVENOUS
  Filled 2019-07-07: qty 24

## 2019-07-07 MED ORDER — HEPARIN SOD (PORK) LOCK FLUSH 100 UNIT/ML IV SOLN
500.0000 [IU] | Freq: Once | INTRAVENOUS | Status: AC | PRN
  Administered 2019-07-07: 500 [IU]
  Filled 2019-07-07: qty 5

## 2019-07-07 MED ORDER — SODIUM CHLORIDE 0.9% FLUSH
10.0000 mL | INTRAVENOUS | Status: DC | PRN
  Administered 2019-07-07: 10 mL
  Filled 2019-07-07: qty 10

## 2019-07-07 NOTE — Patient Instructions (Signed)
[facility] Discharge Instructions for Patients Receiving Chemotherapy  Today you received the following chemotherapy agents Opdivo  To help prevent nausea and vomiting after your treatment, we encourage you to take your nausea medication as directed   If you develop nausea and vomiting that is not controlled by your nausea medication, call the clinic.   BELOW ARE SYMPTOMS THAT SHOULD BE REPORTED IMMEDIATELY:  *FEVER GREATER THAN 100.5 F  *CHILLS WITH OR WITHOUT FEVER  NAUSEA AND VOMITING THAT IS NOT CONTROLLED WITH YOUR NAUSEA MEDICATION  *UNUSUAL SHORTNESS OF BREATH  *UNUSUAL BRUISING OR BLEEDING  TENDERNESS IN MOUTH AND THROAT WITH OR WITHOUT PRESENCE OF ULCERS  *URINARY PROBLEMS  *BOWEL PROBLEMS  UNUSUAL RASH Items with * indicate a potential emergency and should be followed up as soon as possible.  Feel free to call the clinic should you have any questions or concerns. The clinic phone number is [phone].  Please show the CHEMO ALERT CARD at check-in to the Emergency Department and triage nurse.   

## 2019-07-07 NOTE — Progress Notes (Signed)
Okay to treat with Creat 1.77 per [name]

## 2019-07-08 ENCOUNTER — Other Ambulatory Visit: Payer: Self-pay | Admitting: Hematology

## 2019-07-08 LAB — URINE CULTURE

## 2019-07-08 MED ORDER — CEFPODOXIME PROXETIL 100 MG PO TABS: 100.0000 mg | ORAL_TABLET | Freq: Two times a day (BID) | ORAL | 0 refills | Status: AC

## 2019-07-15 NOTE — Progress Notes (Signed)
Pharmacist Chemotherapy Monitoring - Follow Up Assessment    I verify that I have reviewed each item in the below checklist:  . Regimen for the patient is scheduled for the appropriate day and plan matches scheduled date. Marland Kitchen Appropriate non-routine labs are ordered dependent on drug ordered. . If applicable, additional medications reviewed and ordered per protocol based on lifetime cumulative doses and/or treatment regimen.   Plan for follow-up and/or issues identified: No . I-vent associated with next due treatment: No    Kennith Center, Pharm.D., CPP 07/15/2019@3 :07 PM

## 2019-07-16 ENCOUNTER — Ambulatory Visit (INDEPENDENT_AMBULATORY_CARE_PROVIDER_SITE_OTHER): Payer: Medicare Other | Admitting: Gastroenterology

## 2019-07-16 ENCOUNTER — Encounter: Payer: Self-pay | Admitting: Gastroenterology

## 2019-07-16 VITALS — BP 138/72 | HR 90 | Temp 98.0°F | Ht 59.0 in | Wt 136.0 lb

## 2019-07-16 DIAGNOSIS — Z9049 Acquired absence of other specified parts of digestive tract: Secondary | ICD-10-CM

## 2019-07-16 DIAGNOSIS — Z9889 Other specified postprocedural states: Secondary | ICD-10-CM

## 2019-07-16 DIAGNOSIS — K831 Obstruction of bile duct: Secondary | ICD-10-CM | POA: Diagnosis not present

## 2019-07-16 DIAGNOSIS — R7989 Other specified abnormal findings of blood chemistry: Secondary | ICD-10-CM

## 2019-07-16 DIAGNOSIS — R945 Abnormal results of liver function studies: Secondary | ICD-10-CM | POA: Diagnosis not present

## 2019-07-16 MED ORDER — OMEPRAZOLE 20 MG PO CPDR: 20.0000 mg | DELAYED_RELEASE_CAPSULE | Freq: Every day | ORAL | 3 refills | Status: DC

## 2019-07-16 NOTE — Patient Instructions (Signed)
We have sent the following medications to your pharmacy for you to pick up at your convenience: Omeprazole  We will see you back in July/August of 2021 in office for follow-up.  If you are age 78 or older, your body mass index should be between 23-30. Your Body mass index is 27.47 kg/m. If this is out of the aforementioned range listed, please consider follow up with your Primary Care Provider.  If you are age 46 or younger, your body mass index should be between 19-25. Your Body mass index is 27.47 kg/m. If this is out of the aformentioned range listed, please consider follow up with your Primary Care Provider.    Thank you for choosing me and [facility].  Dr. Rush Landmark

## 2019-07-17 ENCOUNTER — Encounter: Payer: Self-pay | Admitting: Gastroenterology

## 2019-07-17 DIAGNOSIS — Z9049 Acquired absence of other specified parts of digestive tract: Secondary | ICD-10-CM | POA: Insufficient documentation

## 2019-07-17 NOTE — Progress Notes (Addendum)
[place] VISIT   Primary Care Provider [name], MD [address] [place] [address] [phone]  Patient Profile: Audrey [name] is a 79 y.o. 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  The patient presents to the [facility] for an evaluation and management of problem(s) noted below:  Problem List 1. History of biliary duct stent placement   2. Biliary stricture   3. Abnormal LFTs   4. History of cholecystectomy    History of Present Illness: Please see prior consultation note/progress notes for full details of HPI.  Interval History The patient is doing well on her current transition to immune therapy.  Most recent PET scan earlier this month shows no evidence of metastatic disease.  She is extremely happy.  Her liver tests are normal.  She has had complete cessation of all of her discomfort since her gallbladder was removed.  She really wants to keep things as stable as possible.  She does understand the concern of the previous pathology/cytology showing evidence of potential malignancy on the bile duct brushings from prior.  The patient denies any issues with jaundice, scleral icterus, pruritus, darkened/amber urine, clay-colored stools, LE edema, hematemesis, coffee-ground emesis, abdominal distention, confusion, new generalized pruritus.  The patient is using the restroom on a daily basis now without need for laxative therapy except on an as-needed basis.   GI Review of Systems Positive as above Negative for dysphagia, odynophagia, change in bowel habits, melena, hematochezia  Review of Systems General: Denies fevers/chills Cardiovascular: Denies chest  pain Pulmonary: Denies shortness of breath Gastroenterological: See HPI Genitourinary: Denies darkened urine Hematological: Denies easy bruising Dermatological: Denies jaundice Psychological: Mood is stable   Medications Current Outpatient Medications  Medication Sig Dispense Refill  . amLODipine (NORVASC) 5 MG tablet Take 5 mg by mouth daily.   6  . atorvastatin (LIPITOR) 40 MG tablet Take 40 mg by mouth at bedtime.     . carvedilol (COREG) 6.25 MG tablet Take 6.25 mg by mouth 2 (two) times daily.  6  . clobetasol ointment (TEMOVATE) [id number] % Apply 1 application topically 2 (two) times daily as needed (lichen sclerosis).     . Denosumab (XGEVA [place]) Inject into the skin every 6 (six) weeks.    Marland Kitchen glimepiride (AMARYL) 1 MG tablet Take 1 mg by mouth daily with breakfast.    . hydrocortisone (CORTEF) 10 MG tablet Take 5-10 mg by mouth See admin instructions. Take 1 tablet (10 mg) by mouth in the morning & 1/2 tablet (5 mg) by mouth in the evening.    Marland Kitchen levothyroxine (SYNTHROID) 75 MCG tablet Take 75 mcg by mouth daily before breakfast.     . lidocaine (LIDODERM) 5 % Place 1 patch onto the skin daily. Remove & Discard patch within 12 hours or as directed by MD Using on Right Hip 30 patch 0  . lidocaine-prilocaine (EMLA) cream Apply 1 application topically daily as needed (prior to port access). 30 g 1  . Nivolumab (OPDIVO IV) Inject 1 Dose into the vein every 14 (fourteen) days. [place]    . omeprazole (PRILOSEC) 20 MG capsule Take 1 capsule (20 mg total) by mouth daily. 60 capsule 3  . repaglinide (PRANDIN) 0.5 MG tablet Take 0.5 mg by mouth 2 (two) times  daily before a meal.      No current facility-administered medications for this visit.   Facility-Administered Medications Ordered in Other Visits  Medication Dose Route Frequency Provider Last Rate Last Admin  . sodium chloride flush (NS) 0.9 % injection 10 mL  10 mL Intracatheter PRN Truitt Merle, MD   10 mL at 11/20/18 1232     Allergies Allergies  Allergen Reactions  . Adhesive [Tape] Other (See Comments)    Tears skin off Paper tape is ok per patient  . Gabapentin Other (See Comments)    "Makes me loopy"  . Nsaids Other (See Comments)    Liver damage  . Statins Other (See Comments)    Liver/kidney issues.    Histories Past Medical History:  Diagnosis Date  . Anemia   . Arthritis    lower back, hips, hands  . Biliary stricture   . Diabetes mellitus ([place])    type 2 - no meds, diet controlled  . Early cataracts, bilateral   . Elevated liver enzymes   . GERD (gastroesophageal reflux disease)    occasional - diet controlled  . History of hiatal hernia   . HTN (hypertension)   . Hyperlipidemia   . Hypothyroidism   . left renal ca dx'd 2018   renal cancer - left kidney removed, pill chemo x 1 yr  . Myocardial infarction ([place]) 1991   no deficits  . SVD (spontaneous vaginal delivery)    x 3  . Wears glasses    Past Surgical History:  Procedure Laterality Date  . BALLOON DILATION N/A 07/23/2018   Procedure: BALLOON DILATION;  Surgeon: Rush Landmark Telford Nab., MD;  Location: [place];  Service: Gastroenterology;  Laterality: N/A;  . BILIARY BRUSHING  08/10/2018   Procedure: BILIARY BRUSHING;  Surgeon: Rush Landmark Telford Nab., MD;  Location: [place];  Service: Gastroenterology;;  . BILIARY BRUSHING  11/30/2018   Procedure: BILIARY BRUSHING;  Surgeon: Irving Copas., MD;  Location: [place];  Service: Gastroenterology;;  . BILIARY DILATION  08/10/2018   Procedure: BILIARY DILATION;  Surgeon: Irving Copas., MD;  Location: [place];  Service: Gastroenterology;;  . BILIARY DILATION  08/27/2018   Procedure: BILIARY DILATION;  Surgeon: Irving Copas., MD;  Location: [place];  Service: Gastroenterology;;  . BILIARY STENT PLACEMENT  08/10/2018   Procedure: BILIARY STENT PLACEMENT;  Surgeon: Irving Copas., MD;  Location: [place];  Service:  Gastroenterology;;  . BILIARY STENT PLACEMENT  08/27/2018   Procedure: BILIARY STENT PLACEMENT;  Surgeon: Irving Copas., MD;  Location: [place];  Service: Gastroenterology;;  . BILIARY STENT PLACEMENT  11/30/2018   Procedure: BILIARY STENT PLACEMENT;  Surgeon: Irving Copas., MD;  Location: [place];  Service: Gastroenterology;;  . BIOPSY  07/23/2018   Procedure: BIOPSY;  Surgeon: Irving Copas., MD;  Location: [place];  Service: Gastroenterology;;  . CHOLECYSTECTOMY N/A 09/02/2018   Procedure: LAPAROSCOPIC CHOLECYSTECTOMY;  Surgeon: Stark Klein, MD;  Location: [place];  Service: General;  Laterality: N/A;  . COLONOSCOPY     normal   . ENDOSCOPIC RETROGRADE CHOLANGIOPANCREATOGRAPHY (ERCP) WITH PROPOFOL N/A 08/10/2018   Procedure: ENDOSCOPIC RETROGRADE CHOLANGIOPANCREATOGRAPHY (ERCP) WITH PROPOFOL;  Surgeon: Irving Copas., MD;  Location: [place];  Service: Gastroenterology;  Laterality: N/A;  . ENDOSCOPIC RETROGRADE CHOLANGIOPANCREATOGRAPHY (ERCP) WITH PROPOFOL N/A 11/30/2018   Procedure: ENDOSCOPIC RETROGRADE CHOLANGIOPANCREATOGRAPHY (ERCP) WITH PROPOFOL;  Surgeon: Rush Landmark Telford Nab., MD;  Location: [place];  Service: Gastroenterology;  Laterality: N/A;  . ERCP N/A 07/23/2018  Procedure: ENDOSCOPIC RETROGRADE CHOLANGIOPANCREATOGRAPHY (ERCP);  Surgeon: Irving Copas., MD;  Location: [place];  Service: Gastroenterology;  Laterality: N/A;  . ERCP N/A 08/27/2018   Procedure: ENDOSCOPIC RETROGRADE CHOLANGIOPANCREATOGRAPHY (ERCP);  Surgeon: Irving Copas., MD;  Location: [place];  Service: Gastroenterology;  Laterality: N/A;  . ESOPHAGOGASTRODUODENOSCOPY (EGD) WITH PROPOFOL N/A 08/27/2018   Procedure: ESOPHAGOGASTRODUODENOSCOPY (EGD) WITH PROPOFOL;  Surgeon: Rush Landmark Telford Nab., MD;  Location: [place];  Service: Gastroenterology;  Laterality: N/A;  . EUS N/A 08/27/2018   Procedure: ESOPHAGEAL ENDOSCOPIC  ULTRASOUND (EUS) RADIAL;  Surgeon: Rush Landmark Telford Nab., MD;  Location: [place];  Service: Gastroenterology;  Laterality: N/A;  . FINE NEEDLE ASPIRATION  08/27/2018   Procedure: FINE NEEDLE ASPIRATION (FNA) LINEAR;  Surgeon: Irving Copas., MD;  Location: [place];  Service: Gastroenterology;;  . IR IMAGING GUIDED PORT INSERTION  01/08/2018  . LAPAROSCOPIC NEPHRECTOMY Left 08/01/2016   Procedure: LAPAROSCOPIC  RADICAL NEPHRECTOMY/ REPAIR OF UMBILICAL HERNIA;  Surgeon: Raynelle Bring, MD;  Location: [facility];  Service: Urology;  Laterality: Left;  . REMOVAL OF STONES  07/23/2018   Procedure: REMOVAL OF GALL STONES;  Surgeon: Rush Landmark Telford Nab., MD;  Location: [place];  Service: Gastroenterology;;  . REMOVAL OF STONES  08/10/2018   Procedure: REMOVAL OF STONES;  Surgeon: Irving Copas., MD;  Location: [place];  Service: Gastroenterology;;  . REMOVAL OF STONES  08/27/2018   Procedure: REMOVAL OF STONES;  Surgeon: Irving Copas., MD;  Location: [place];  Service: Gastroenterology;;  . [name]  07/23/2018   Procedure: [name];  Surgeon: Irving Copas., MD;  Location: [place];  Service: Gastroenterology;;  . Lavell Islam REMOVAL  08/27/2018   Procedure: STENT REMOVAL;  Surgeon: Irving Copas., MD;  Location: [place];  Service: Gastroenterology;;  . Lavell Islam REMOVAL  11/30/2018   Procedure: STENT REMOVAL;  Surgeon: Irving Copas., MD;  Location: [place];  Service: Gastroenterology;;  . UPPER GI ENDOSCOPY     x 1   Social History   Socioeconomic History  . Marital status: Married    Spouse name: Not on file  . Number of children: 3  . Years of education: Not on file  . Highest education level: Not on file  Occupational History  . Occupation: retired  Tobacco Use  . Smoking status: Former Smoker    Packs/day: 1.00    Years: 8.00    Pack years: 8.00    Types: Cigarettes    Quit date: 06/18/1964    Years  since quitting: 55.1  . Smokeless tobacco: Never Used  Substance and Sexual Activity  . Alcohol use: No  . Drug use: No  . Sexual activity: Not on file  Other Topics Concern  . Not on file  Social History Narrative   Married.  Three children   Social Determinants of Health   Financial Resource Strain:   . Difficulty of Paying Living Expenses:   Food Insecurity:   . Worried About Charity fundraiser in the Last Year:   . Arboriculturist in the Last Year:   Transportation Needs:   . Film/video editor (Medical):   Marland Kitchen Lack of Transportation (Non-Medical):   Physical Activity:   . Days of Exercise per Week:   . Minutes of Exercise per Session:   Stress:   . Feeling of Stress :   Social Connections:   . Frequency of Communication with Friends and Family:   . Frequency of Social Gatherings with Friends and Family:   .  Attends Religious Services:   . Active Member of Clubs or Organizations:   . Attends Archivist Meetings:   Marland Kitchen Marital Status:   Intimate Partner Violence:   . Fear of Current or Ex-Partner:   . Emotionally Abused:   Marland Kitchen Physically Abused:   . Sexually Abused:    Family History  Problem Relation Age of Onset  . Heart attack Father 66  . Heart disease Brother 69       CABG  . Colon cancer Neg Hx   . Esophageal cancer Neg Hx   . Inflammatory bowel disease Neg Hx   . Liver disease Neg Hx   . Pancreatic cancer Neg Hx   . Rectal cancer Neg Hx   . Stomach cancer Neg Hx    I have reviewed her medical, social, and family history in detail and updated the electronic medical record as necessary.    PHYSICAL EXAMINATION  BP 138/72   Pulse 90   Temp 98 F (36.7 C)   Ht 4\' 11"  (1.499 m)   Wt 136 lb (61.7 kg)   LMP  (LMP Unknown)   BMI 27.47 kg/m  GEN: NAD, appears stated age, doesn't appear chronically ill PSYCH: Cooperative, without pressured speech EYE: Conjunctivae pink, sclerae anicteric ENT: MMM CV: RR without R/Gs  RESP: CTAB  posteriorly, without wheezing GI: NABS, soft, NT/ND, surgical scars present that are well-healed, without rebound or guarding MSK/EXT: No lower extremity edema SKIN: No jaundice NEURO:  Alert & Oriented x 3, no focal deficits   REVIEW OF DATA  I reviewed the following data at the time of this encounter:  GI Procedures and Studies  August 2020 ERCP - Prior biliary sphincterotomy appeared open. - Two partially occluded stents from the biliary tree were seen in the major papilla. These were removed and sent for cytology. - The fluoroscopic examination was suspicious for sludge. - A single mild biliary narrowing was found in the lower third of the main bile duct. The stricture was indeterminate - through previous sampling at one point suggested malignant cells (negative EUS for pancreatic mass and negative CTs). - The biliary tree was swept and sludge was found. - Cells for cytology obtained in the lower third of the main duct narrowing. - One covered metal biliary stent was placed into the common bile duct to traverse the narrowing/stenosis.  Laboratory Studies  Reviewed in epic  Imaging Studies  March 2021 PET scan IMPRESSION: 1. No evidence of hypermetabolic metastatic disease. 2. Aortic atherosclerosis (ICD10-I70.0). Coronary artery calcification.   ASSESSMENT  Ms. Silvestro is a 79 y.o. 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  The patient is seen today for evaluation and management of:  1. History of biliary duct stent placement   2. Biliary stricture   3. Abnormal LFTs   4. History of cholecystectomy    The patient is hemodynamically and clinically stable at this time.  The patient has had complete normalization of her  liver biochemical tests.  She has no evidence of significant metastatic disease on her most recent PET scan.  However, my prior brushings had suggested at one point a potential malignancy of the biliary tract.  She does not seem to be acting in that particular manner.  I discussed that continuing to maintain the current bile duct  stent does have risks in patients who may end up living in extended period in time.  There is a risk of sludge development/stent on remove ability.  She would like to keep things in for the next few months.  I am okay with that for now but at the year mark we need to we discussed potentially removing this stent.  I will see her back around July and August and we can decide how things are at that point to potentially clean out the stent and/or replace if necessary.  All patient questions were answered, to the best of my ability, and the patient agrees to the aforementioned plan of action with follow-up as indicated.   PLAN  Maintain biliary stent for now Monitor for signs of stent dysfunction Continue FiberCon daily Continue omeprazole 20 mg daily Follow-up with urogynecology Follow-up in July or August of this year to further discuss potential ERCP follow-up   No orders of the defined types were placed in this encounter.   New Prescriptions   No medications on file   Modified Medications   Modified Medication Previous Medication   OMEPRAZOLE (PRILOSEC) 20 MG CAPSULE omeprazole (PRILOSEC) 20 MG capsule      Take 1 capsule (20 mg total) by mouth daily.    Take 1 capsule by mouth once daily    Planned Follow Up: No follow-ups on file.    Total Time in Face-to-Face and in Coordination of Care for patient including independent/personal interpretation/review of prior testing, medical history, examination, medication adjustment, communicating results with the patient directly, and documentation with the EHR is 25 minutes.   Justice Britain, MD [place]  [facility] Office # [phone]

## 2019-07-21 ENCOUNTER — Other Ambulatory Visit: Payer: Medicare Other

## 2019-07-21 ENCOUNTER — Ambulatory Visit: Payer: Medicare Other

## 2019-07-21 ENCOUNTER — Inpatient Hospital Stay: Payer: Medicare Other

## 2019-07-21 ENCOUNTER — Other Ambulatory Visit: Payer: Self-pay

## 2019-07-21 VITALS — BP 135/65 | HR 70 | Temp 98.9°F | Resp 16 | Wt 134.8 lb

## 2019-07-21 DIAGNOSIS — C642 Malignant neoplasm of left kidney, except renal pelvis: Secondary | ICD-10-CM

## 2019-07-21 DIAGNOSIS — Z5112 Encounter for antineoplastic immunotherapy: Secondary | ICD-10-CM | POA: Diagnosis not present

## 2019-07-21 DIAGNOSIS — C7951 Secondary malignant neoplasm of bone: Secondary | ICD-10-CM

## 2019-07-21 DIAGNOSIS — Z95828 Presence of other vascular implants and grafts: Secondary | ICD-10-CM

## 2019-07-21 DIAGNOSIS — Z7189 Other specified counseling: Secondary | ICD-10-CM

## 2019-07-21 LAB — CBC WITH DIFFERENTIAL/PLATELET
Abs Immature Granulocytes: 0.21 10*3/uL — ABNORMAL HIGH (ref 0.00–0.07)
Basophils Absolute: 0.1 10*3/uL (ref 0.0–0.1)
Basophils Relative: 1 %
Eosinophils Absolute: 0.2 10*3/uL (ref 0.0–0.5)
Eosinophils Relative: 2 %
HCT: 39.1 % (ref 36.0–46.0)
Hemoglobin: 12.7 g/dL (ref 12.0–15.0)
Immature Granulocytes: 2 %
Lymphocytes Relative: 16 %
Lymphs Abs: 1.6 10*3/uL (ref 0.7–4.0)
MCH: 29.1 pg (ref 26.0–34.0)
MCHC: 32.5 g/dL (ref 30.0–36.0)
MCV: 89.7 fL (ref 80.0–100.0)
Monocytes Absolute: 1 10*3/uL (ref 0.1–1.0)
Monocytes Relative: 10 %
Neutro Abs: 6.9 10*3/uL (ref 1.7–7.7)
Neutrophils Relative %: 69 %
Platelets: 276 10*3/uL (ref 150–400)
RBC: 4.36 MIL/uL (ref 3.87–5.11)
RDW: 13.8 % (ref 11.5–15.5)
WBC: 9.9 10*3/uL (ref 4.0–10.5)
nRBC: 0 % (ref 0.0–0.2)

## 2019-07-21 LAB — CMP (CANCER CENTER ONLY)
ALT: 17 U/L (ref 0–44)
AST: 16 U/L (ref 15–41)
Albumin: 3.7 g/dL (ref 3.5–5.0)
Alkaline Phosphatase: 59 U/L (ref 38–126)
Anion gap: 12 (ref 5–15)
BUN: 42 mg/dL — ABNORMAL HIGH (ref 8–23)
CO2: 16 mmol/L — ABNORMAL LOW (ref 22–32)
Calcium: 9.6 mg/dL (ref 8.9–10.3)
Chloride: 108 mmol/L (ref 98–111)
Creatinine: 1.8 mg/dL — ABNORMAL HIGH (ref 0.44–1.00)
GFR, Est AFR Am: 31 mL/min — ABNORMAL LOW (ref >60)
GFR, Estimated: 27 mL/min — ABNORMAL LOW (ref >60)
Glucose, Bld: 200 mg/dL — ABNORMAL HIGH (ref 70–99)
Potassium: 4.6 mmol/L (ref 3.5–5.1)
Sodium: 136 mmol/L (ref 135–145)
Total Bilirubin: 0.4 mg/dL (ref 0.3–1.2)
Total Protein: 6.9 g/dL (ref 6.5–8.1)

## 2019-07-21 LAB — LACTATE DEHYDROGENASE: LDH: 123 U/L (ref 98–192)

## 2019-07-21 MED ORDER — SODIUM CHLORIDE 0.9% FLUSH
10.0000 mL | Freq: Once | INTRAVENOUS | Status: AC
  Administered 2019-07-21: 10 mL
  Filled 2019-07-21: qty 10

## 2019-07-21 MED ORDER — SODIUM CHLORIDE 0.9% FLUSH
10.0000 mL | INTRAVENOUS | Status: DC | PRN
  Administered 2019-07-21: 10 mL
  Filled 2019-07-21: qty 10

## 2019-07-21 MED ORDER — SODIUM CHLORIDE 0.9 % IV SOLN
Freq: Once | INTRAVENOUS | Status: AC
  Filled 2019-07-21: qty 250

## 2019-07-21 MED ORDER — SODIUM CHLORIDE 0.9 % IV SOLN
240.0000 mg | Freq: Once | INTRAVENOUS | Status: AC
  Administered 2019-07-21: 240 mg via INTRAVENOUS
  Filled 2019-07-21: qty 24

## 2019-07-21 MED ORDER — HEPARIN SOD (PORK) LOCK FLUSH 100 UNIT/ML IV SOLN
500.0000 [IU] | Freq: Once | INTRAVENOUS | Status: AC | PRN
  Administered 2019-07-21: 500 [IU]
  Filled 2019-07-21: qty 5

## 2019-07-21 NOTE — Patient Instructions (Signed)

## 2019-07-21 NOTE — Progress Notes (Signed)
Verbal order per [name] - ok to receive nivolumab today with Creatinine 1.8.

## 2019-07-21 NOTE — Patient Instructions (Signed)
[facility] Discharge Instructions for Patients Receiving Chemotherapy  Today you received the following chemotherapy agents Opdivo  To help prevent nausea and vomiting after your treatment, we encourage you to take your nausea medication as directed   If you develop nausea and vomiting that is not controlled by your nausea medication, call the clinic.   BELOW ARE SYMPTOMS THAT SHOULD BE REPORTED IMMEDIATELY:  *FEVER GREATER THAN 100.5 F  *CHILLS WITH OR WITHOUT FEVER  NAUSEA AND VOMITING THAT IS NOT CONTROLLED WITH YOUR NAUSEA MEDICATION  *UNUSUAL SHORTNESS OF BREATH  *UNUSUAL BRUISING OR BLEEDING  TENDERNESS IN MOUTH AND THROAT WITH OR WITHOUT PRESENCE OF ULCERS  *URINARY PROBLEMS  *BOWEL PROBLEMS  UNUSUAL RASH Items with * indicate a potential emergency and should be followed up as soon as possible.  Feel free to call the clinic should you have any questions or concerns. The clinic phone number is [phone].  Please show the CHEMO ALERT CARD at check-in to the Emergency Department and triage nurse.   

## 2019-07-26 ENCOUNTER — Other Ambulatory Visit: Payer: Self-pay | Admitting: Hematology

## 2019-07-28 ENCOUNTER — Encounter: Payer: Self-pay | Admitting: Hematology

## 2019-07-29 NOTE — Progress Notes (Signed)
Pharmacist Chemotherapy Monitoring - Follow Up Assessment    I verify that I have reviewed each item in the below checklist:  . Regimen for the patient is scheduled for the appropriate day and plan matches scheduled date. Marland Kitchen Appropriate non-routine labs are ordered dependent on drug ordered. . If applicable, additional medications reviewed and ordered per protocol based on lifetime cumulative doses and/or treatment regimen.   Plan for follow-up and/or issues identified: No . I-vent associated with next due treatment: No . MD and/or nursing notified: No  Audrey [name] K 07/29/2019 10:52 AM

## 2019-07-29 NOTE — Telephone Encounter (Signed)
Contacted patient with Dr. Grier Mitts response to question in Mychart r/t having injection in hip tomorrow following Covid vaccine today: [name] does not recommend a steroid injection (if that's the injection she is referring to) less than 24 hours after vaccine. He stated she should inform provider giving injection about the covid vaccine for their input if she decided to have it. Patient verbalized understanding of information. States she had her appt dates confused and that the injection in her hip is not until late next week.

## 2019-08-04 ENCOUNTER — Inpatient Hospital Stay: Payer: Medicare Other

## 2019-08-04 ENCOUNTER — Other Ambulatory Visit: Payer: Self-pay

## 2019-08-04 ENCOUNTER — Inpatient Hospital Stay: Payer: Medicare Other | Attending: Hematology

## 2019-08-04 ENCOUNTER — Telehealth: Payer: Self-pay | Admitting: *Deleted

## 2019-08-04 ENCOUNTER — Inpatient Hospital Stay (HOSPITAL_BASED_OUTPATIENT_CLINIC_OR_DEPARTMENT_OTHER): Payer: Medicare Other | Admitting: Hematology

## 2019-08-04 VITALS — BP 141/64 | HR 60 | Temp 98.5°F | Resp 17 | Ht 59.0 in | Wt 135.5 lb

## 2019-08-04 DIAGNOSIS — M549 Dorsalgia, unspecified: Secondary | ICD-10-CM | POA: Insufficient documentation

## 2019-08-04 DIAGNOSIS — I10 Essential (primary) hypertension: Secondary | ICD-10-CM | POA: Diagnosis not present

## 2019-08-04 DIAGNOSIS — C7972 Secondary malignant neoplasm of left adrenal gland: Secondary | ICD-10-CM | POA: Diagnosis not present

## 2019-08-04 DIAGNOSIS — I129 Hypertensive chronic kidney disease with stage 1 through stage 4 chronic kidney disease, or unspecified chronic kidney disease: Secondary | ICD-10-CM | POA: Diagnosis not present

## 2019-08-04 DIAGNOSIS — Z95828 Presence of other vascular implants and grafts: Secondary | ICD-10-CM

## 2019-08-04 DIAGNOSIS — I700 Atherosclerosis of aorta: Secondary | ICD-10-CM | POA: Insufficient documentation

## 2019-08-04 DIAGNOSIS — I340 Nonrheumatic mitral (valve) insufficiency: Secondary | ICD-10-CM | POA: Insufficient documentation

## 2019-08-04 DIAGNOSIS — Z5112 Encounter for antineoplastic immunotherapy: Secondary | ICD-10-CM

## 2019-08-04 DIAGNOSIS — M461 Sacroiliitis, not elsewhere classified: Secondary | ICD-10-CM | POA: Insufficient documentation

## 2019-08-04 DIAGNOSIS — Z7189 Other specified counseling: Secondary | ICD-10-CM

## 2019-08-04 DIAGNOSIS — E871 Hypo-osmolality and hyponatremia: Secondary | ICD-10-CM | POA: Diagnosis not present

## 2019-08-04 DIAGNOSIS — Z87891 Personal history of nicotine dependence: Secondary | ICD-10-CM | POA: Insufficient documentation

## 2019-08-04 DIAGNOSIS — M79602 Pain in left arm: Secondary | ICD-10-CM | POA: Insufficient documentation

## 2019-08-04 DIAGNOSIS — C7951 Secondary malignant neoplasm of bone: Secondary | ICD-10-CM

## 2019-08-04 DIAGNOSIS — E039 Hypothyroidism, unspecified: Secondary | ICD-10-CM | POA: Diagnosis not present

## 2019-08-04 DIAGNOSIS — I2510 Atherosclerotic heart disease of native coronary artery without angina pectoris: Secondary | ICD-10-CM | POA: Diagnosis not present

## 2019-08-04 DIAGNOSIS — E1136 Type 2 diabetes mellitus with diabetic cataract: Secondary | ICD-10-CM | POA: Insufficient documentation

## 2019-08-04 DIAGNOSIS — E119 Type 2 diabetes mellitus without complications: Secondary | ICD-10-CM | POA: Diagnosis not present

## 2019-08-04 DIAGNOSIS — E875 Hyperkalemia: Secondary | ICD-10-CM | POA: Diagnosis not present

## 2019-08-04 DIAGNOSIS — C7800 Secondary malignant neoplasm of unspecified lung: Secondary | ICD-10-CM | POA: Insufficient documentation

## 2019-08-04 DIAGNOSIS — Z79899 Other long term (current) drug therapy: Secondary | ICD-10-CM | POA: Insufficient documentation

## 2019-08-04 DIAGNOSIS — Z794 Long term (current) use of insulin: Secondary | ICD-10-CM | POA: Insufficient documentation

## 2019-08-04 DIAGNOSIS — E860 Dehydration: Secondary | ICD-10-CM

## 2019-08-04 DIAGNOSIS — N189 Chronic kidney disease, unspecified: Secondary | ICD-10-CM | POA: Diagnosis not present

## 2019-08-04 DIAGNOSIS — T464X5A Adverse effect of angiotensin-converting-enzyme inhibitors, initial encounter: Secondary | ICD-10-CM | POA: Insufficient documentation

## 2019-08-04 DIAGNOSIS — E785 Hyperlipidemia, unspecified: Secondary | ICD-10-CM | POA: Diagnosis not present

## 2019-08-04 DIAGNOSIS — C642 Malignant neoplasm of left kidney, except renal pelvis: Secondary | ICD-10-CM | POA: Insufficient documentation

## 2019-08-04 DIAGNOSIS — M1990 Unspecified osteoarthritis, unspecified site: Secondary | ICD-10-CM | POA: Insufficient documentation

## 2019-08-04 DIAGNOSIS — K219 Gastro-esophageal reflux disease without esophagitis: Secondary | ICD-10-CM | POA: Insufficient documentation

## 2019-08-04 DIAGNOSIS — K8020 Calculus of gallbladder without cholecystitis without obstruction: Secondary | ICD-10-CM | POA: Insufficient documentation

## 2019-08-04 DIAGNOSIS — E440 Moderate protein-calorie malnutrition: Secondary | ICD-10-CM | POA: Diagnosis not present

## 2019-08-04 DIAGNOSIS — E2740 Unspecified adrenocortical insufficiency: Secondary | ICD-10-CM | POA: Insufficient documentation

## 2019-08-04 DIAGNOSIS — I252 Old myocardial infarction: Secondary | ICD-10-CM | POA: Insufficient documentation

## 2019-08-04 DIAGNOSIS — E1122 Type 2 diabetes mellitus with diabetic chronic kidney disease: Secondary | ICD-10-CM | POA: Insufficient documentation

## 2019-08-04 DIAGNOSIS — K649 Unspecified hemorrhoids: Secondary | ICD-10-CM | POA: Insufficient documentation

## 2019-08-04 LAB — CBC WITH DIFFERENTIAL/PLATELET
Abs Immature Granulocytes: 0.06 10*3/uL (ref 0.00–0.07)
Abs Immature Granulocytes: 0.08 10*3/uL — ABNORMAL HIGH (ref 0.00–0.07)
Basophils Absolute: 0 10*3/uL (ref 0.0–0.1)
Basophils Absolute: 0 10*3/uL (ref 0.0–0.1)
Basophils Relative: 1 %
Basophils Relative: 0 %
Eosinophils Absolute: 0.3 10*3/uL (ref 0.0–0.5)
Eosinophils Absolute: 0.3 10*3/uL (ref 0.0–0.5)
Eosinophils Relative: 4 %
Eosinophils Relative: 3 %
HCT: 33.4 % — ABNORMAL LOW (ref 36.0–46.0)
HCT: 36.4 % (ref 36.0–46.0)
Hemoglobin: 10.6 g/dL — ABNORMAL LOW (ref 12.0–15.0)
Hemoglobin: 11.5 g/dL — ABNORMAL LOW (ref 12.0–15.0)
Immature Granulocytes: 1 %
Immature Granulocytes: 1 %
Lymphocytes Relative: 16 %
Lymphocytes Relative: 17 %
Lymphs Abs: 1.2 10*3/uL (ref 0.7–4.0)
Lymphs Abs: 1.6 10*3/uL (ref 0.7–4.0)
MCH: 29 pg (ref 26.0–34.0)
MCH: 29 pg (ref 26.0–34.0)
MCHC: 31.7 g/dL (ref 30.0–36.0)
MCHC: 31.6 g/dL (ref 30.0–36.0)
MCV: 91.5 fL (ref 80.0–100.0)
MCV: 91.9 fL (ref 80.0–100.0)
Monocytes Absolute: 0.8 10*3/uL (ref 0.1–1.0)
Monocytes Absolute: 0.8 10*3/uL (ref 0.1–1.0)
Monocytes Relative: 10 %
Monocytes Relative: 9 %
Neutro Abs: 5.3 10*3/uL (ref 1.7–7.7)
Neutro Abs: 6.4 10*3/uL (ref 1.7–7.7)
Neutrophils Relative %: 68 %
Neutrophils Relative %: 70 %
Platelets: 247 10*3/uL (ref 150–400)
Platelets: 290 10*3/uL (ref 150–400)
RBC: 3.65 MIL/uL — ABNORMAL LOW (ref 3.87–5.11)
RBC: 3.96 MIL/uL (ref 3.87–5.11)
RDW: 14.4 % (ref 11.5–15.5)
RDW: 14.3 % (ref 11.5–15.5)
WBC: 7.7 10*3/uL (ref 4.0–10.5)
WBC: 9.2 10*3/uL (ref 4.0–10.5)
nRBC: 0 % (ref 0.0–0.2)
nRBC: 0 % (ref 0.0–0.2)

## 2019-08-04 LAB — CMP (CANCER CENTER ONLY)
ALT: 14 U/L (ref 0–44)
ALT: 14 U/L (ref 0–44)
AST: 16 U/L (ref 15–41)
AST: 14 U/L — ABNORMAL LOW (ref 15–41)
Albumin: 3.2 g/dL — ABNORMAL LOW (ref 3.5–5.0)
Albumin: 3.4 g/dL — ABNORMAL LOW (ref 3.5–5.0)
Alkaline Phosphatase: 44 U/L (ref 38–126)
Alkaline Phosphatase: 50 U/L (ref 38–126)
Anion gap: 7 (ref 5–15)
Anion gap: 7 (ref 5–15)
BUN: 20 mg/dL (ref 8–23)
BUN: 21 mg/dL (ref 8–23)
CO2: 20 mmol/L — ABNORMAL LOW (ref 22–32)
CO2: 20 mmol/L — ABNORMAL LOW (ref 22–32)
Calcium: 7.7 mg/dL — ABNORMAL LOW (ref 8.9–10.3)
Calcium: 7.8 mg/dL — ABNORMAL LOW (ref 8.9–10.3)
Chloride: 112 mmol/L — ABNORMAL HIGH (ref 98–111)
Chloride: 111 mmol/L (ref 98–111)
Creatinine: 1.44 mg/dL — ABNORMAL HIGH (ref 0.44–1.00)
Creatinine: 1.44 mg/dL — ABNORMAL HIGH (ref 0.44–1.00)
GFR, Est AFR Am: 40 mL/min — ABNORMAL LOW (ref >60)
GFR, Est AFR Am: 40 mL/min — ABNORMAL LOW (ref >60)
GFR, Estimated: 35 mL/min — ABNORMAL LOW (ref >60)
GFR, Estimated: 35 mL/min — ABNORMAL LOW (ref >60)
Glucose, Bld: 163 mg/dL — ABNORMAL HIGH (ref 70–99)
Glucose, Bld: 162 mg/dL — ABNORMAL HIGH (ref 70–99)
Potassium: 4.2 mmol/L (ref 3.5–5.1)
Potassium: 4.3 mmol/L (ref 3.5–5.1)
Sodium: 139 mmol/L (ref 135–145)
Sodium: 138 mmol/L (ref 135–145)
Total Bilirubin: 0.4 mg/dL (ref 0.3–1.2)
Total Bilirubin: 0.4 mg/dL (ref 0.3–1.2)
Total Protein: 6.1 g/dL — ABNORMAL LOW (ref 6.5–8.1)
Total Protein: 6.5 g/dL (ref 6.5–8.1)

## 2019-08-04 LAB — LACTATE DEHYDROGENASE: LDH: 131 U/L (ref 98–192)

## 2019-08-04 MED ORDER — SODIUM CHLORIDE 0.9 % IV SOLN
Freq: Once | INTRAVENOUS | Status: AC
  Filled 2019-08-04: qty 250

## 2019-08-04 MED ORDER — SODIUM CHLORIDE 0.9% FLUSH
10.0000 mL | INTRAVENOUS | Status: DC | PRN
  Administered 2019-08-04: 10 mL
  Filled 2019-08-04: qty 10

## 2019-08-04 MED ORDER — DENOSUMAB 120 MG/1.7ML ~~LOC~~ SOLN
120.0000 mg | Freq: Once | SUBCUTANEOUS | Status: AC
  Administered 2019-08-04: 120 mg via SUBCUTANEOUS

## 2019-08-04 MED ORDER — HEPARIN SOD (PORK) LOCK FLUSH 100 UNIT/ML IV SOLN
500.0000 [IU] | Freq: Once | INTRAVENOUS | Status: DC
  Administered 2019-08-04: 500 [IU]
  Filled 2019-08-04: qty 5

## 2019-08-04 MED ORDER — SODIUM CHLORIDE 0.9% FLUSH
10.0000 mL | Freq: Once | INTRAVENOUS | Status: AC
  Administered 2019-08-04: 10 mL
  Filled 2019-08-04: qty 10

## 2019-08-04 MED ORDER — HEPARIN SOD (PORK) LOCK FLUSH 100 UNIT/ML IV SOLN
500.0000 [IU] | Freq: Once | INTRAVENOUS | Status: DC | PRN
  Filled 2019-08-04: qty 5

## 2019-08-04 MED ORDER — SODIUM CHLORIDE 0.9 % IV SOLN
240.0000 mg | Freq: Once | INTRAVENOUS | Status: AC
  Administered 2019-08-04: 240 mg via INTRAVENOUS
  Filled 2019-08-04: qty 24

## 2019-08-04 MED ORDER — DENOSUMAB 120 MG/1.7ML ~~LOC~~ SOLN
SUBCUTANEOUS | Status: AC
  Filled 2019-08-04: qty 1.7

## 2019-08-04 NOTE — Progress Notes (Signed)
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 She is here for C37D1 of Nivolumab. The patient's last visit with [name] was on 06/23/2019. The pt reports that she is doing well overall.  The pt reports that she as an injection scheduled for tomorrow to assist with her hip pain. Pt has an appointment next month at [place] with Dr. Blima Rich for uterine prolapse surgery consultation.  She has been having some difficulty managing her blood glucose levels. She spoke with her Endocrinologist, Dr. Buddy Duty, who wanted to place pt on a medication to assist in controlling her blood glucose levels. Pt has some reservations about using the medication because it is known to weaken bone strength. She is also hesitant to begin using insulin, so she is working on improving her diet.   Pt has had some itching, soreness, pain, and warmth in the arm that she received her second dose of the COVID19 vaccine in. The first night after her injection she was dry heaving and had overall weakness.   Of note since the patient's last visit, pt has had PET/CT ([id number]) completed on 07/02/2019 with results revealing "1. No evidence of hypermetabolic metastatic disease."  Lab results today (08/04/19) of CBC w/diff and CMP is as follows: all values are WNL except for RBC at 3.65, Hgb at 10.6, HCT at 33.4, Chloride at 112, CO2 at 20, Glucose at 163, Creatinine at 1.44, Calcium at 7.7, Total Protein at 6.1, Albumin at 3.2, GFR Est Non Af Am at 35. 08/04/2019 LDH at 131  On  review of systems, pt reports chronic hip/back pain, left arm pain/soreness and denies rash, diarrhea and any other symptoms.   MEDICAL HISTORY:   Past Medical History:  Diagnosis Date  . Anemia   . Arthritis    lower back, hips, hands  . Biliary stricture   . Diabetes mellitus ([place])    type 2 - no meds, diet controlled  . Early cataracts, bilateral   . Elevated liver enzymes   . GERD (gastroesophageal reflux disease)    occasional - diet controlled  . History of hiatal hernia   . HTN (hypertension)   . Hyperlipidemia   . Hypothyroidism   . left renal ca dx'd 2018   renal cancer - left kidney removed, pill chemo x 1 yr  . Myocardial infarction ([place]) 1991   no deficits  . SVD (spontaneous vaginal delivery)    x 3  . Wears glasses    Dyslipidemia Osteoarthritis Ex-smoker Coronary artery disease Thyroid disorder-was apparently on levothyroxine 25 g daily which has subsequently been discontinued . Mitral regurgitation B12 deficiency  hiatal hernia with esophagitis   SURGICAL HISTORY: Past Surgical History:  Procedure Laterality Date  . BALLOON DILATION N/A 07/23/2018   Procedure: BALLOON DILATION;  Surgeon: Rush Landmark Telford Nab., MD;  Location: [place];  Service: Gastroenterology;  Laterality: N/A;  . BILIARY BRUSHING  08/10/2018   Procedure: BILIARY BRUSHING;  Surgeon: Irving Copas., MD;  Location: [facility];  Service: Gastroenterology;;  . BILIARY BRUSHING  11/30/2018  Procedure: BILIARY BRUSHING;  Surgeon: Rush Landmark Telford Nab., MD;  Location: [place];  Service: Gastroenterology;;  . BILIARY DILATION  08/10/2018   Procedure: BILIARY DILATION;  Surgeon: Irving Copas., MD;  Location: [place];  Service: Gastroenterology;;  . BILIARY DILATION  08/27/2018   Procedure: BILIARY DILATION;  Surgeon: Irving Copas., MD;  Location: [place];  Service: Gastroenterology;;  . BILIARY STENT PLACEMENT  08/10/2018   Procedure: BILIARY  STENT PLACEMENT;  Surgeon: Irving Copas., MD;  Location: [place];  Service: Gastroenterology;;  . BILIARY STENT PLACEMENT  08/27/2018   Procedure: BILIARY STENT PLACEMENT;  Surgeon: Irving Copas., MD;  Location: [place];  Service: Gastroenterology;;  . BILIARY STENT PLACEMENT  11/30/2018   Procedure: BILIARY STENT PLACEMENT;  Surgeon: Irving Copas., MD;  Location: [place];  Service: Gastroenterology;;  . BIOPSY  07/23/2018   Procedure: BIOPSY;  Surgeon: Irving Copas., MD;  Location: [place];  Service: Gastroenterology;;  . CHOLECYSTECTOMY N/A 09/02/2018   Procedure: LAPAROSCOPIC CHOLECYSTECTOMY;  Surgeon: Stark Klein, MD;  Location: [facility];  Service: General;  Laterality: N/A;  . COLONOSCOPY     normal   . ENDOSCOPIC RETROGRADE CHOLANGIOPANCREATOGRAPHY (ERCP) WITH PROPOFOL N/A 08/10/2018   Procedure: ENDOSCOPIC RETROGRADE CHOLANGIOPANCREATOGRAPHY (ERCP) WITH PROPOFOL;  Surgeon: Irving Copas., MD;  Location: [place];  Service: Gastroenterology;  Laterality: N/A;  . ENDOSCOPIC RETROGRADE CHOLANGIOPANCREATOGRAPHY (ERCP) WITH PROPOFOL N/A 11/30/2018   Procedure: ENDOSCOPIC RETROGRADE CHOLANGIOPANCREATOGRAPHY (ERCP) WITH PROPOFOL;  Surgeon: Rush Landmark Telford Nab., MD;  Location: [place];  Service: Gastroenterology;  Laterality: N/A;  . ERCP N/A 07/23/2018   Procedure: ENDOSCOPIC RETROGRADE CHOLANGIOPANCREATOGRAPHY (ERCP);  Surgeon: Irving Copas., MD;  Location: [facility];  Service: Gastroenterology;  Laterality: N/A;  . ERCP N/A 08/27/2018   Procedure: ENDOSCOPIC RETROGRADE CHOLANGIOPANCREATOGRAPHY (ERCP);  Surgeon: Irving Copas., MD;  Location: [place];  Service: Gastroenterology;  Laterality: N/A;  . ESOPHAGOGASTRODUODENOSCOPY (EGD) WITH PROPOFOL N/A 08/27/2018   Procedure: ESOPHAGOGASTRODUODENOSCOPY (EGD) WITH PROPOFOL;  Surgeon: Rush Landmark Telford Nab., MD;  Location: [facility];  Service:  Gastroenterology;  Laterality: N/A;  . EUS N/A 08/27/2018   Procedure: ESOPHAGEAL ENDOSCOPIC ULTRASOUND (EUS) RADIAL;  Surgeon: Rush Landmark Telford Nab., MD;  Location: [place];  Service: Gastroenterology;  Laterality: N/A;  . FINE NEEDLE ASPIRATION  08/27/2018   Procedure: FINE NEEDLE ASPIRATION (FNA) LINEAR;  Surgeon: Irving Copas., MD;  Location: [place];  Service: Gastroenterology;;  . IR IMAGING GUIDED PORT INSERTION  01/08/2018  . LAPAROSCOPIC NEPHRECTOMY Left 08/01/2016   Procedure: LAPAROSCOPIC  RADICAL NEPHRECTOMY/ REPAIR OF UMBILICAL HERNIA;  Surgeon: Raynelle Bring, MD;  Location: [facility];  Service: Urology;  Laterality: Left;  . REMOVAL OF STONES  07/23/2018   Procedure: REMOVAL OF GALL STONES;  Surgeon: Rush Landmark Telford Nab., MD;  Location: [place];  Service: Gastroenterology;;  . REMOVAL OF STONES  08/10/2018   Procedure: REMOVAL OF STONES;  Surgeon: Irving Copas., MD;  Location: [place];  Service: Gastroenterology;;  . REMOVAL OF STONES  08/27/2018   Procedure: REMOVAL OF STONES;  Surgeon: Irving Copas., MD;  Location: [facility];  Service: Gastroenterology;;  . [name]  07/23/2018   Procedure: [name];  Surgeon: Irving Copas., MD;  Location: [facility];  Service: Gastroenterology;;  . Lavell Islam REMOVAL  08/27/2018   Procedure: STENT REMOVAL;  Surgeon: Irving Copas., MD;  Location: [place];  Service: Gastroenterology;;  . Lavell Islam REMOVAL  11/30/2018   Procedure: STENT REMOVAL;  Surgeon: Irving Copas., MD;  Location: [facility]  ENDOSCOPY;  Service: Gastroenterology;;  . UPPER GI ENDOSCOPY     x 1   No reported past surgeries EGD 05/01/2016 Dr. Earley Brooke Colonoscopy 03/2016 Dr. Earley Brooke  SOCIAL HISTORY: Social History   Socioeconomic History  . Marital status: Married    Spouse name: Not on file  . Number of children: 3  . Years of education: Not on file  . Highest education level: Not on file    Occupational History  . Occupation: retired  Tobacco Use  . Smoking status: Former Smoker    Packs/day: 1.00    Years: 8.00    Pack years: 8.00    Types: Cigarettes    Quit date: 06/18/1964    Years since quitting: 55.1  . Smokeless tobacco: Never Used  Substance and Sexual Activity  . Alcohol use: No  . Drug use: No  . Sexual activity: Not on file  Other Topics Concern  . Not on file  Social History Narrative   Married.  Three children   Social Determinants of Health   Financial Resource Strain:   . Difficulty of Paying Living Expenses:   Food Insecurity:   . Worried About Charity fundraiser in the Last Year:   . Arboriculturist in the Last Year:   Transportation Needs:   . Film/video editor (Medical):   Marland Kitchen Lack of Transportation (Non-Medical):   Physical Activity:   . Days of Exercise per Week:   . Minutes of Exercise per Session:   Stress:   . Feeling of Stress :   Social Connections:   . Frequency of Communication with Friends and Family:   . Frequency of Social Gatherings with Friends and Family:   . Attends Religious Services:   . Active Member of Clubs or Organizations:   . Attends Archivist Meetings:   Marland Kitchen Marital Status:   Intimate Partner Violence:   . Fear of Current or Ex-Partner:   . Emotionally Abused:   Marland Kitchen Physically Abused:   . Sexually Abused:    Patient lives in [place] Works as a Solicitor part-time Ex-smoker quit long time ago In 1961 . Smoked 2 packs per day for 8 years prior to that .  or a lot of stress since her daughter is also getting treated for stage IV uterine cancer.  FAMILY HISTORY:  Mother deceased Father with asthma and emphysema died at 19 years with the MI. Brother deceased of heart disease   ALLERGIES:  is allergic to adhesive [tape]; gabapentin; nsaids; and statins.  MEDICATIONS:  Current Outpatient Medications  Medication Sig Dispense Refill  . amLODipine (NORVASC) 5 MG tablet Take 5  mg by mouth daily.   6  . atorvastatin (LIPITOR) 40 MG tablet Take 40 mg by mouth at bedtime.     . carvedilol (COREG) 6.25 MG tablet Take 6.25 mg by mouth 2 (two) times daily.  6  . clobetasol ointment (TEMOVATE) [id number] % Apply 1 application topically 2 (two) times daily as needed (lichen sclerosis).     . Denosumab (XGEVA [place]) Inject into the skin every 6 (six) weeks.    Marland Kitchen glimepiride (AMARYL) 1 MG tablet Take 1 mg by mouth daily with breakfast.    . hydrocortisone (CORTEF) 10 MG tablet Take 5-10 mg by mouth See admin instructions. Take 1 tablet (10 mg) by mouth in the morning & 1/2 tablet (5 mg) by mouth in the evening.    Marland Kitchen levothyroxine (SYNTHROID) 75 MCG tablet Take 75 mcg  by mouth daily before breakfast.     . lidocaine (LIDODERM) 5 % APPLY 1  TOPICALLY ON RIGHT HIP ONCE DAILY. REMOVE AND DISCARD PATCH WITHIN 12 HOURS OR AS DIRECTED BY DOCTOR. 30 patch 0  . lidocaine-prilocaine (EMLA) cream Apply 1 application topically daily as needed (prior to port access). 30 g 1  . Nivolumab (OPDIVO IV) Inject 1 Dose into the vein every 14 (fourteen) days. [place]    . omeprazole (PRILOSEC) 20 MG capsule Take 1 capsule (20 mg total) by mouth daily. 60 capsule 3   No current facility-administered medications for this visit.   Facility-Administered Medications Ordered in Other Visits  Medication Dose Route Frequency Provider Last Rate Last Admin  . sodium chloride flush (NS) 0.9 % injection 10 mL  10 mL Intracatheter PRN Truitt Merle, MD   10 mL at 11/20/18 1232    REVIEW OF SYSTEMS:   A 10+ POINT REVIEW OF SYSTEMS WAS OBTAINED including neurology, dermatology, psychiatry, cardiac, respiratory, lymph, extremities, GI, GU, Musculoskeletal, constitutional, breasts, reproductive, HEENT.  All pertinent positives are noted in the HPI.  All others are negative.   PHYSICAL EXAMINATION:  ECOG FS:2 - Symptomatic, <50% confined to bed  Vitals:   08/04/19 1003  BP: (!) 141/64  Pulse: 60  Resp: 17    Temp: 98.5 F (36.9 C)  SpO2: 100%   Wt Readings from Last 3 Encounters:  08/04/19 135 lb 8 oz (61.5 kg)  07/21/19 134 lb 12 oz (61.1 kg)  07/16/19 136 lb (61.7 kg)   Body mass index is 27.37 kg/m.    Exam was given in a chair   GENERAL:alert, in no acute distress and comfortable SKIN: no acute rashes, no significant lesions EYES: conjunctiva are pink and non-injected, sclera anicteric OROPHARYNX: MMM, no exudates, no oropharyngeal erythema or ulceration NECK: supple, no JVD LYMPH:  no palpable lymphadenopathy in the cervical, axillary or inguinal regions LUNGS: clear to auscultation b/l with normal respiratory effort HEART: regular rate & rhythm ABDOMEN:  normoactive bowel sounds , non tender, not distended. No palpable hepatosplenomegaly.  Extremity: no pedal edema PSYCH: alert & oriented x 3 with fluent speech NEURO: no focal motor/sensory deficits  LABORATORY DATA:  I have reviewed the data as listed  . CBC Latest Ref Rng & Units 08/04/2019 07/21/2019 07/07/2019  WBC 4.0 - 10.5 K/uL 7.7 9.9 7.3  Hemoglobin 12.0 - 15.0 g/dL 10.6(L) 12.7 12.7  Hematocrit 36.0 - 46.0 % 33.4(L) 39.1 39.5  Platelets 150 - 400 K/uL 247 276 282    CBC    Component Value Date/Time   WBC 7.7 08/04/2019 0859   RBC 3.65 (L) 08/04/2019 0859   HGB 10.6 (L) 08/04/2019 0859   HGB 11.4 (L) 09/11/2018 0900   HGB 11.2 (L) 04/04/2017 0830   HCT 33.4 (L) 08/04/2019 0859   HCT 35.2 04/04/2017 0830   PLT 247 08/04/2019 0859   PLT 277 09/11/2018 0900   PLT 250 04/04/2017 0830   MCV 91.5 08/04/2019 0859   MCV 107.6 (H) 04/04/2017 0830   MCH 29.0 08/04/2019 0859   MCHC 31.7 08/04/2019 0859   RDW 14.4 08/04/2019 0859   RDW 14.0 04/04/2017 0830   LYMPHSABS 1.2 08/04/2019 0859   LYMPHSABS 1.6 04/04/2017 0830   MONOABS 0.8 08/04/2019 0859   MONOABS 0.4 04/04/2017 0830   EOSABS 0.3 08/04/2019 0859   EOSABS 0.1 04/04/2017 0830   BASOSABS 0.0 08/04/2019 0859   BASOSABS 0.0 04/04/2017 0830      .  CMP Latest Ref Rng & Units 08/04/2019 07/21/2019 07/07/2019  Glucose 70 - 99 mg/dL 163(H) 200(H) 192(H)  BUN 8 - 23 mg/dL 20 42(H) 30(H)  Creatinine 0.44 - 1.00 mg/dL 1.44(H) 1.80(H) 1.77(H)  Sodium 135 - 145 mmol/L 139 136 138  Potassium 3.5 - 5.1 mmol/L 4.2 4.6 4.3  Chloride 98 - 111 mmol/L 112(H) 108 106  CO2 22 - 32 mmol/L 20(L) 16(L) 20(L)  Calcium 8.9 - 10.3 mg/dL 7.7(L) 9.6 9.2  Total Protein 6.5 - 8.1 g/dL 6.1(L) 6.9 6.8  Total Bilirubin 0.3 - 1.2 mg/dL 0.4 0.4 0.4  Alkaline Phos 38 - 126 U/L 44 59 59  AST 15 - 41 U/L 16 16 13(L)  ALT 0 - 44 U/L 14 17 12    B12 level 299 (OSH)-->455  . Lab Results  Component Value Date   LDH 131 08/04/2019        08/27/18 Surgical Pathology from Cholecystectomy:         RADIOGRAPHIC STUDIES: I have personally reviewed the radiological images as listed and agreed with the findings in the report. Nm Pet Image Restag (ps) Skull Base To Thigh  Result Date: 01/03/2017 IMPRESSION: 1. There is a new lesion in the hepatic dome which is FDG avid and low in attenuation on CT imaging consistent with metastatic disease. Another region of uptake in the left hepatic lobe posteriorly demonstrates no CT correlate. A second subtle metastasis is not excluded on today's study. An MRI could better assess for other hepatic metastases if clinically warranted. 2. New metastatic lesion in the left side of T8. 3. New pulmonary nodule in the right upper lobe. This is too small to characterize but suspicious. Recommend attention on follow-up. No FDG avid nodules or other enlarging nodules. 4. The uptake at the previous left renal artery has almost resolved in the interval and is favored to be post therapeutic. Recommend attention on follow-up. 5. The metastasis in the posterior right hilum seen previously has resolved. Electronically Signed   By: Dorise Bullion III M.D   On: 01/03/2017 11:16   .No results found.  ASSESSMENT & PLAN:    79 y.o.  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 PET/CT 06/19/2017 -- consistent with partial metabolic response to treatment.  Rt adrenal gland bx - showed clear cell RCC  S/p CYtoreductive left radical nephrectomy and left adrenal gland resection on 08/01/2016 by Dr Alinda Money.  10/16/17 PET/CT revealed Continued improvement, with the T7 metastatic lesion no longer significantly hypermetabolic. The previous right lower lobe pulmonary nodule is even less apparent, perhaps about 2 mm in diameter today; given that this measured 6 mm on 03/28/2017 this probably represents an effectively treated metastatic lesion. Currently no appreciable hypermetabolic activity is identified to suggest active malignancy. Distended gallbladder with gallbladder wall thickening and gallstones. Correlate clinically in assessing for cholecystitis. Small but abnormal amount of free pelvic fluid, nonspecific. Other imaging findings of potential clinical significance: Chronic ethmoid sinusitis. Aortic Atherosclerosis. Stable 5 mm right middle lobe pulmonary nodule, not hypermetabolic but below sensitive PET-CT size thresholds. Left nephrectomy. Notable pelvic floor laxity with cystocele. Chronic bilateral Sacroiliitis.  04/03/18 PET/CT revealed No evidence for new or progressive hypermetabolic disease on today's study to suggest metastatic progression. 2. Stable appearance of the T7 metastatic lesion without Hypermetabolism. 3. Tiny focus of FDG accumulation identified along the skin of the low right inguinal fold. No  associated lesion evident on CT. This may be related to urinary contaminant. 4. Cholelithiasis with similar appearance of diffuse gallbladder wall thickening. 5. Stable 5 mm right middle lobe pulmonary nodule. 6.  Aortic Atherosclerois. 7. Diffuse colonic diverticulosis.   09/18/18 CT A/P revealed  "Common duct stent in place. Improvement to resolution in biliary duct dilatation compared to 08/29/2018. Interval cholecystectomy without acute complication. 2. Left nephrectomy, without evidence of metastatic disease. 3.  Tiny hiatal hernia. 4. Coronary artery atherosclerosis. Aortic Atherosclerosis. 5. Mild limitations secondary to lack of IV contrast. 6. Marked pelvic floor laxity with cystocele and rectal prolapse".  07/02/2019 PET/CT ([id number]) which revealed "1. No evidence of hypermetabolic metastatic disease."  #2 b/l adrenal metastases from [place] s/p left adrenalectomy with adrenal insuff - follows with Dr Buddy Duty.  #3 Small pulmonary lesions -- 10/16/17 PET/CT showed pulmonary less apparent than 03/28/17 PET/CT, decreasing from 39mm to 30mm diameter.   MRI brain shows no evidence of metastatic disease  #4 T7/8 Bone metastases - received Xgeva every 4 weeks from May 2018 to June 2019. 10/16/17 PET/CT revealed improvement with T7 metastatic lesion no longer significantly hypermetabolic.  -on Marchelle Folks  #5 ?liver mets- rpt PET/CT from 10/16/2017 shows no overt evidence of metastatic disease in the liver.  #6 Grade 1 Nausea - improved and intermittent. Hasnt used her anti-emetic as instructed so some nausea and decreased po intake.  #7 Grade 1 Diarrhea - resolved  #8 Hyponatremia -  resolved with sodium at 136 - likely related to some element of adrenal insufficiency, diarrhea, limited by mouth intake. Primarily solute free fluid intake.   #9 Acute on chronic renal insufficiency Creatine stable 1.44  #10 Hyperkalemia due to ACEI + RF- resolved  #11 Hemorrhoids -chronic with some bleeding --Recommended Sitz bath and OTC Anusol or Nupercaine for her hemorrhoid relief. -f/u with PCP for continued mx   #12 Moderate protein calorie malnutrition Weight has stabilized and improved Wt Readings from Last 3 Encounters:  08/04/19 135 lb 8 oz (61.5 kg)  07/21/19 134 lb 12 oz (61.1 kg)   07/16/19 136 lb (61.7 kg)   Plan: Continue healthy po intake/diabetic diet -Previously recommended the patient to drink atleast 48-64 oz of fluids daily -f/u with PCP/Cardiology for diuretics management  #13 Hypothyroidism/Adrenal insufficiency/Diabetes -Continue being followed by Dr. Buddy Duty  #14 HTN - ?control. Patient tends to be anxious and has higher blood pressures in the clinic. She can have increased blood pressure from [place] as well. Plan: -continued close f/u with her PCP /cardiology regarding the many elements necessary for her care that are not directly related to her oncology care. -ACEI held due to AKI and hyperkalemia - following with cardiology to mx this. Has been started on Amlodipine instead.  #15 Grade 1 mucositis- resolved -Advised the patient to continue use of Magic Mouthwash to aid with nutrition  #16 Newly diagnosed ?CAD - positive cardiac nuclear stress test Following with cardiology and nephrology   #17 h/o Choledocholithiasis and cholelithiasis 07/10/18 US Abdomen revealed Biliary duct dilatation with 8 mm calculus at the level of the ampulla in the distal common bile duct. 2. Cholelithiasis. No gallbladder wall thickening or pericholecystic fluid. 3. Appearance of the liver raises concern for underlying hepatic cirrhosis. No focal liver lesions are demonstrable. 4.  Left kidney absent. 5.  Small cysts in right kidney. 6.  Aortic Atherosclerosis.  S/p ERCP on 07/23/18 with Dr. Valarie Merino Mansouraty  09/02/18 Cytopathology from ERCP was suspicious for malignant cells in bile duct, but  was not definitive, other two findings were benign.  PLAN:  -Discussed pt labwork today, 08/04/19; all values are WNL except for RBC at 3.65, Hgb at 10.6, HCT at 33.4, Chloride at 112, CO2 at 20, Glucose at 163, Creatinine at 1.44, Calcium at 7.7, Total Protein at 6.1, Albumin at 3.2, GFR Est Non Af Am at 35. -Discussed 08/04/2019 LDH is WNL at 131 -Labs appear diluted. Will have labs  redrawn.  -The pt has no prohibitive toxicities from continuing C37D1 of Nivolumab at this time -Advised pt that if she goes forward with uterine prolapse surgery we would need to hold her immunotherapy for a time  -Pt appears to be aware of the risks associated with surgery and would like to proceed. Will need clearance from her PCP and cardiologist. -Continue Xgeva to every 6 weeks  -Will see back in 4 weeks with labs   FOLLOW UP: Please schedule next 4 cycles of Nivolumab with portflush and labs MD visit in 4 weeks   The total time spent in the appt was 30 minutes and more than 50% was on counseling and direct patient cares.  All of the patient's questions were answered with apparent satisfaction. The patient knows to call the clinic with any problems, questions or concerns.  Sullivan Lone MD [place] AAHIVMS [facility] Hematology/Oncology Physician [facility]  (Office):       [phone] (Work cell):  [phone] (Fax):           [phone]  I, Yevette Edwards, am acting as a scribe for Dr. Sullivan Lone.    .I have reviewed the above documentation for accuracy and completeness, and I agree with the above. Brunetta Genera MD

## 2019-08-04 NOTE — Telephone Encounter (Signed)
Contacted by April Cassidy, NP with [facility] Urology and Nephrology. [phone] Considering use of suppressive antibiotics 3 x week for persistent UTI - either Macrobid or Keflex. She would like [name] to be informed and his response. [name] informed.  Contacted Ms. Cassidy with Dr. Grier Mitts response - He understands need and recommends using Keflex due to patient age. Ms. Primitivo Gauze.

## 2019-08-04 NOTE — Progress Notes (Signed)
Ok to treat with labs & ok to give xgeva with calcium 7.7.  Will redraw labs but do not have to wait on results per [name].

## 2019-08-12 NOTE — Progress Notes (Signed)
Pharmacist Chemotherapy Monitoring - Follow Up Assessment    I verify that I have reviewed each item in the below checklist:  . Regimen for the patient is scheduled for the appropriate day and plan matches scheduled date. Marland Kitchen Appropriate non-routine labs are ordered dependent on drug ordered. . If applicable, additional medications reviewed and ordered per protocol based on lifetime cumulative doses and/or treatment regimen.   Plan for follow-up and/or issues identified: No . I-vent associated with next due treatment: No . MD and/or nursing notified: No  Audrey [name] D 08/12/2019 2:29 PM

## 2019-08-18 ENCOUNTER — Other Ambulatory Visit: Payer: Self-pay | Admitting: Hematology

## 2019-08-18 ENCOUNTER — Ambulatory Visit: Payer: Medicare Other

## 2019-08-18 ENCOUNTER — Encounter: Payer: Self-pay | Admitting: Hematology

## 2019-08-18 ENCOUNTER — Inpatient Hospital Stay: Payer: Medicare Other

## 2019-08-18 ENCOUNTER — Other Ambulatory Visit: Payer: Medicare Other

## 2019-08-18 ENCOUNTER — Other Ambulatory Visit: Payer: Self-pay

## 2019-08-18 VITALS — BP 167/92 | HR 58 | Temp 98.7°F | Resp 18

## 2019-08-18 DIAGNOSIS — C7951 Secondary malignant neoplasm of bone: Secondary | ICD-10-CM

## 2019-08-18 DIAGNOSIS — Z5112 Encounter for antineoplastic immunotherapy: Secondary | ICD-10-CM

## 2019-08-18 DIAGNOSIS — C642 Malignant neoplasm of left kidney, except renal pelvis: Secondary | ICD-10-CM

## 2019-08-18 DIAGNOSIS — Z7189 Other specified counseling: Secondary | ICD-10-CM

## 2019-08-18 LAB — CBC WITH DIFFERENTIAL/PLATELET
Abs Immature Granulocytes: 0.11 10*3/uL — ABNORMAL HIGH (ref 0.00–0.07)
Basophils Absolute: 0.1 10*3/uL (ref 0.0–0.1)
Basophils Relative: 1 %
Eosinophils Absolute: 0.3 10*3/uL (ref 0.0–0.5)
Eosinophils Relative: 3 %
HCT: 38.3 % (ref 36.0–46.0)
Hemoglobin: 12.1 g/dL (ref 12.0–15.0)
Immature Granulocytes: 1 %
Lymphocytes Relative: 24 %
Lymphs Abs: 2.1 10*3/uL (ref 0.7–4.0)
MCH: 28.9 pg (ref 26.0–34.0)
MCHC: 31.6 g/dL (ref 30.0–36.0)
MCV: 91.6 fL (ref 80.0–100.0)
Monocytes Absolute: 1 10*3/uL (ref 0.1–1.0)
Monocytes Relative: 11 %
Neutro Abs: 5.4 10*3/uL (ref 1.7–7.7)
Neutrophils Relative %: 60 %
Platelets: 241 10*3/uL (ref 150–400)
RBC: 4.18 MIL/uL (ref 3.87–5.11)
RDW: 14.4 % (ref 11.5–15.5)
WBC: 9 10*3/uL (ref 4.0–10.5)
nRBC: 0 % (ref 0.0–0.2)

## 2019-08-18 LAB — CMP (CANCER CENTER ONLY)
ALT: 18 U/L (ref 0–44)
AST: 13 U/L — ABNORMAL LOW (ref 15–41)
Albumin: 3.7 g/dL (ref 3.5–5.0)
Alkaline Phosphatase: 59 U/L (ref 38–126)
Anion gap: 11 (ref 5–15)
BUN: 35 mg/dL — ABNORMAL HIGH (ref 8–23)
CO2: 25 mmol/L (ref 22–32)
Calcium: 9.7 mg/dL (ref 8.9–10.3)
Chloride: 102 mmol/L (ref 98–111)
Creatinine: 1.58 mg/dL — ABNORMAL HIGH (ref 0.44–1.00)
GFR, Est AFR Am: 36 mL/min — ABNORMAL LOW (ref >60)
GFR, Estimated: 31 mL/min — ABNORMAL LOW (ref >60)
Glucose, Bld: 128 mg/dL — ABNORMAL HIGH (ref 70–99)
Potassium: 4.7 mmol/L (ref 3.5–5.1)
Sodium: 138 mmol/L (ref 135–145)
Total Bilirubin: 0.5 mg/dL (ref 0.3–1.2)
Total Protein: 6.8 g/dL (ref 6.5–8.1)

## 2019-08-18 LAB — LACTATE DEHYDROGENASE: LDH: 121 U/L (ref 98–192)

## 2019-08-18 MED ORDER — SODIUM CHLORIDE 0.9 % IV SOLN
240.0000 mg | Freq: Once | INTRAVENOUS | Status: AC
  Administered 2019-08-18: 11:00:00 240 mg via INTRAVENOUS
  Filled 2019-08-18: qty 24

## 2019-08-18 MED ORDER — SODIUM CHLORIDE 0.9 % IV SOLN
Freq: Once | INTRAVENOUS | Status: AC
  Filled 2019-08-18: qty 250

## 2019-08-18 MED ORDER — SODIUM CHLORIDE 0.9% FLUSH
10.0000 mL | INTRAVENOUS | Status: DC | PRN
  Administered 2019-08-18: 11:00:00 10 mL
  Filled 2019-08-18: qty 10

## 2019-08-18 MED ORDER — HEPARIN SOD (PORK) LOCK FLUSH 100 UNIT/ML IV SOLN
500.0000 [IU] | Freq: Once | INTRAVENOUS | Status: AC | PRN
  Administered 2019-08-18: 11:00:00 500 [IU]
  Filled 2019-08-18: qty 5

## 2019-08-18 NOTE — Patient Instructions (Signed)
[facility] Discharge Instructions for Patients Receiving Chemotherapy  Today you received the following chemotherapy agents Opdivo  To help prevent nausea and vomiting after your treatment, we encourage you to take your nausea medication as directed   If you develop nausea and vomiting that is not controlled by your nausea medication, call the clinic.   BELOW ARE SYMPTOMS THAT SHOULD BE REPORTED IMMEDIATELY:  *FEVER GREATER THAN 100.5 F  *CHILLS WITH OR WITHOUT FEVER  NAUSEA AND VOMITING THAT IS NOT CONTROLLED WITH YOUR NAUSEA MEDICATION  *UNUSUAL SHORTNESS OF BREATH  *UNUSUAL BRUISING OR BLEEDING  TENDERNESS IN MOUTH AND THROAT WITH OR WITHOUT PRESENCE OF ULCERS  *URINARY PROBLEMS  *BOWEL PROBLEMS  UNUSUAL RASH Items with * indicate a potential emergency and should be followed up as soon as possible.  Feel free to call the clinic should you have any questions or concerns. The clinic phone number is [phone].  Please show the CHEMO ALERT CARD at check-in to the Emergency Department and triage nurse.   

## 2019-08-18 NOTE — Progress Notes (Signed)
Verbal order per [name] - Madaline Brilliant to receive Nivolumab today with Cr 1.58

## 2019-08-30 NOTE — Progress Notes (Signed)
Pharmacist Chemotherapy Monitoring - Follow Up Assessment    I verify that I have reviewed each item in the below checklist:  . Regimen for the patient is scheduled for the appropriate day and plan matches scheduled date. Marland Kitchen Appropriate non-routine labs are ordered dependent on drug ordered. . If applicable, additional medications reviewed and ordered per protocol based on lifetime cumulative doses and/or treatment regimen.   Plan for follow-up and/or issues identified: No . I-vent associated with next due treatment: No . MD and/or nursing notified: No  Jessi Pitstick K 08/30/2019 9:25 AM

## 2019-09-01 ENCOUNTER — Ambulatory Visit: Payer: Medicare Other

## 2019-09-01 ENCOUNTER — Other Ambulatory Visit: Payer: Medicare Other

## 2019-09-02 ENCOUNTER — Ambulatory Visit: Payer: Medicare Other | Admitting: Hematology

## 2019-09-02 ENCOUNTER — Other Ambulatory Visit: Payer: Medicare Other

## 2019-09-02 ENCOUNTER — Ambulatory Visit: Payer: Medicare Other

## 2019-09-02 NOTE — Progress Notes (Signed)
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 She is here for C39D1 of Nivolumab. The patient's last visit with [name] was on 08/04/19. The pt reports that she is doing well overall.  The pt reports she is good. She sees the OB/GYN next Friday. Pt has been eating well. She went to a urologist in [place] and everything was clear.  Pt is eating well and staying active. She has continued back pain and pinched nerve. Her PCP wants to get a MRI for it.   Lab results today (09/03/19) of CBC w/diff and CMP is as follows: all values are WNL except for Abs Immature Granulocytes at 0.13K, Glucose at 135, BUN at 34, Creatinine at 1.53, GFR, Est Non Af Am at 32, GFR, Est AFR Am at 38 09/03/19 of LDH at 129: WNL  On review of systems, pt reports healthy appetite, constipation and denies fever, chills, infection issues, abdominal pain, diarrhea, skin rashes and any other symptoms.   MEDICAL HISTORY:   Past Medical History:  Diagnosis Date  . Anemia   . Arthritis    lower back, hips, hands  . Biliary stricture   . Diabetes mellitus ([place])    type 2 - no meds, diet controlled  . Early cataracts, bilateral   . Elevated liver enzymes   . GERD (gastroesophageal reflux disease)    occasional - diet controlled  . History of hiatal hernia   . HTN (hypertension)   . Hyperlipidemia   . Hypothyroidism   . left renal ca dx'd 2018   renal cancer - left kidney removed, pill chemo x 1 yr  . Myocardial infarction ([place]) 1991   no deficits  . SVD (spontaneous  vaginal delivery)    x 3  . Wears glasses    Dyslipidemia Osteoarthritis Ex-smoker Coronary artery disease Thyroid disorder-was apparently on levothyroxine 25 g daily which has subsequently been discontinued . Mitral regurgitation B12 deficiency  hiatal hernia with esophagitis   SURGICAL HISTORY: Past Surgical History:  Procedure Laterality Date  . BALLOON DILATION N/A 07/23/2018   Procedure: BALLOON DILATION;  Surgeon: Rush Landmark Telford Nab., MD;  Location: [place];  Service: Gastroenterology;  Laterality: N/A;  . BILIARY BRUSHING  08/10/2018   Procedure: BILIARY BRUSHING;  Surgeon: Rush Landmark Telford Nab., MD;  Location: [place];  Service: Gastroenterology;;  . BILIARY BRUSHING  11/30/2018   Procedure: BILIARY BRUSHING;  Surgeon: Irving Copas., MD;  Location: [place];  Service: Gastroenterology;;  . BILIARY DILATION  08/10/2018   Procedure: BILIARY DILATION;  Surgeon: Irving Copas., MD;  Location: [place];  Service: Gastroenterology;;  . BILIARY DILATION  08/27/2018   Procedure: BILIARY DILATION;  Surgeon: Irving Copas., MD;  Location: [place];  Service: Gastroenterology;;  . BILIARY STENT PLACEMENT  08/10/2018   Procedure: BILIARY STENT PLACEMENT;  Surgeon: Irving Copas., MD;  Location: [place];  Service: Gastroenterology;;  . BILIARY STENT PLACEMENT  08/27/2018   Procedure: BILIARY STENT PLACEMENT;  Surgeon: Irving Copas., MD;  Location: [place];  Service: Gastroenterology;;  .  BILIARY STENT PLACEMENT  11/30/2018   Procedure: BILIARY STENT PLACEMENT;  Surgeon: Irving Copas., MD;  Location: [place];  Service: Gastroenterology;;  . BIOPSY  07/23/2018   Procedure: BIOPSY;  Surgeon: Irving Copas., MD;  Location: [place];  Service: Gastroenterology;;  . CHOLECYSTECTOMY N/A 09/02/2018   Procedure: LAPAROSCOPIC CHOLECYSTECTOMY;  Surgeon: Stark Klein, MD;  Location: [place];  Service:  General;  Laterality: N/A;  . COLONOSCOPY     normal   . ENDOSCOPIC RETROGRADE CHOLANGIOPANCREATOGRAPHY (ERCP) WITH PROPOFOL N/A 08/10/2018   Procedure: ENDOSCOPIC RETROGRADE CHOLANGIOPANCREATOGRAPHY (ERCP) WITH PROPOFOL;  Surgeon: Irving Copas., MD;  Location: [place];  Service: Gastroenterology;  Laterality: N/A;  . ENDOSCOPIC RETROGRADE CHOLANGIOPANCREATOGRAPHY (ERCP) WITH PROPOFOL N/A 11/30/2018   Procedure: ENDOSCOPIC RETROGRADE CHOLANGIOPANCREATOGRAPHY (ERCP) WITH PROPOFOL;  Surgeon: Rush Landmark Telford Nab., MD;  Location: [place];  Service: Gastroenterology;  Laterality: N/A;  . ERCP N/A 07/23/2018   Procedure: ENDOSCOPIC RETROGRADE CHOLANGIOPANCREATOGRAPHY (ERCP);  Surgeon: Irving Copas., MD;  Location: [place];  Service: Gastroenterology;  Laterality: N/A;  . ERCP N/A 08/27/2018   Procedure: ENDOSCOPIC RETROGRADE CHOLANGIOPANCREATOGRAPHY (ERCP);  Surgeon: Irving Copas., MD;  Location: [place];  Service: Gastroenterology;  Laterality: N/A;  . ESOPHAGOGASTRODUODENOSCOPY (EGD) WITH PROPOFOL N/A 08/27/2018   Procedure: ESOPHAGOGASTRODUODENOSCOPY (EGD) WITH PROPOFOL;  Surgeon: Rush Landmark Telford Nab., MD;  Location: [place];  Service: Gastroenterology;  Laterality: N/A;  . EUS N/A 08/27/2018   Procedure: ESOPHAGEAL ENDOSCOPIC ULTRASOUND (EUS) RADIAL;  Surgeon: Rush Landmark Telford Nab., MD;  Location: [place];  Service: Gastroenterology;  Laterality: N/A;  . FINE NEEDLE ASPIRATION  08/27/2018   Procedure: FINE NEEDLE ASPIRATION (FNA) LINEAR;  Surgeon: Irving Copas., MD;  Location: [place];  Service: Gastroenterology;;  . IR IMAGING GUIDED PORT INSERTION  01/08/2018  . LAPAROSCOPIC NEPHRECTOMY Left 08/01/2016   Procedure: LAPAROSCOPIC  RADICAL NEPHRECTOMY/ REPAIR OF UMBILICAL HERNIA;  Surgeon: Raynelle Bring, MD;  Location: [facility];  Service: Urology;  Laterality: Left;  . REMOVAL OF STONES  07/23/2018   Procedure: REMOVAL OF GALL STONES;   Surgeon: Rush Landmark Telford Nab., MD;  Location: [place];  Service: Gastroenterology;;  . REMOVAL OF STONES  08/10/2018   Procedure: REMOVAL OF STONES;  Surgeon: Irving Copas., MD;  Location: [place];  Service: Gastroenterology;;  . REMOVAL OF STONES  08/27/2018   Procedure: REMOVAL OF STONES;  Surgeon: Irving Copas., MD;  Location: [place];  Service: Gastroenterology;;  . [name]  07/23/2018   Procedure: [name];  Surgeon: Irving Copas., MD;  Location: [place];  Service: Gastroenterology;;  . Lavell Islam REMOVAL  08/27/2018   Procedure: STENT REMOVAL;  Surgeon: Irving Copas., MD;  Location: [place];  Service: Gastroenterology;;  . Lavell Islam REMOVAL  11/30/2018   Procedure: STENT REMOVAL;  Surgeon: Irving Copas., MD;  Location: [place];  Service: Gastroenterology;;  . UPPER GI ENDOSCOPY     x 1   No reported past surgeries EGD 05/01/2016 Dr. Earley Brooke Colonoscopy 03/2016 Dr. Earley Brooke  SOCIAL HISTORY: Social History   Socioeconomic History  . Marital status: Married    Spouse name: Not on file  . Number of children: 3  . Years of education: Not on file  . Highest education level: Not on file  Occupational History  . Occupation: retired  Tobacco Use  . Smoking status: Former Smoker    Packs/day: 1.00    Years: 8.00    Pack years: 8.00    Types: Cigarettes    Quit date: 06/18/1964  Years since quitting: 55.2  . Smokeless tobacco: Never Used  Substance and Sexual Activity  . Alcohol use: No  . Drug use: No  . Sexual activity: Not on file  Other Topics Concern  . Not on file  Social History Narrative   Married.  Three children   Social Determinants of Health   Financial Resource Strain:   . Difficulty of Paying Living Expenses:   Food Insecurity:   . Worried About Charity fundraiser in the Last Year:   . Arboriculturist in the Last Year:   Transportation Needs:   . Film/video editor  (Medical):   Marland Kitchen Lack of Transportation (Non-Medical):   Physical Activity:   . Days of Exercise per Week:   . Minutes of Exercise per Session:   Stress:   . Feeling of Stress :   Social Connections:   . Frequency of Communication with Friends and Family:   . Frequency of Social Gatherings with Friends and Family:   . Attends Religious Services:   . Active Member of Clubs or Organizations:   . Attends Archivist Meetings:   Marland Kitchen Marital Status:   Intimate Partner Violence:   . Fear of Current or Ex-Partner:   . Emotionally Abused:   Marland Kitchen Physically Abused:   . Sexually Abused:    Patient lives in [place] Works as a Solicitor part-time Ex-smoker quit long time ago In 1961 . Smoked 2 packs per day for 8 years prior to that .  or a lot of stress since her daughter is also getting treated for stage IV uterine cancer.  FAMILY HISTORY:  Mother deceased Father with asthma and emphysema died at 34 years with the MI. Brother deceased of heart disease   ALLERGIES:  is allergic to adhesive [tape]; gabapentin; nsaids; and statins.  MEDICATIONS:  Current Outpatient Medications  Medication Sig Dispense Refill  . amLODipine (NORVASC) 5 MG tablet Take 5 mg by mouth daily.   6  . atorvastatin (LIPITOR) 40 MG tablet Take 40 mg by mouth at bedtime.     . carvedilol (COREG) 6.25 MG tablet Take 6.25 mg by mouth 2 (two) times daily.  6  . clobetasol ointment (TEMOVATE) [id number] % Apply 1 application topically 2 (two) times daily as needed (lichen sclerosis).     . Denosumab (XGEVA  Shores) Inject into the skin every 6 (six) weeks.    Marland Kitchen glimepiride (AMARYL) 1 MG tablet Take 1 mg by mouth daily with breakfast.    . hydrocortisone (CORTEF) 10 MG tablet Take 5-10 mg by mouth See admin instructions. Take 1 tablet (10 mg) by mouth in the morning & 1/2 tablet (5 mg) by mouth in the evening.    Marland Kitchen levothyroxine (SYNTHROID) 75 MCG tablet Take 75 mcg by mouth daily before breakfast.     .  lidocaine (LIDODERM) 5 % APPLY 1  TOPICALLY ON RIGHT HIP ONCE DAILY. REMOVE AND DISCARD PATCH WITHIN 12 HOURS OR AS DIRECTED BY DOCTOR. 30 patch 0  . lidocaine-prilocaine (EMLA) cream Apply 1 application topically daily as needed (prior to port access). 30 g 1  . Nivolumab (OPDIVO IV) Inject 1 Dose into the vein every 14 (fourteen) days. [facility]    . omeprazole (PRILOSEC) 20 MG capsule Take 1 capsule (20 mg total) by mouth daily. 60 capsule 3  . sitaGLIPtin (JANUVIA) 50 MG tablet Take 50 mg by mouth daily.     No current facility-administered medications for this visit.  Facility-Administered Medications Ordered in Other Visits  Medication Dose Route Frequency Provider Last Rate Last Admin  . sodium chloride flush (NS) 0.9 % injection 10 mL  10 mL Intracatheter PRN Truitt Merle, MD   10 mL at 11/20/18 1232    REVIEW OF SYSTEMS:   A 10+ POINT REVIEW OF SYSTEMS WAS OBTAINED including neurology, dermatology, psychiatry, cardiac, respiratory, lymph, extremities, GI, GU, Musculoskeletal, constitutional, breasts, reproductive, HEENT.  All pertinent positives are noted in the HPI.  All others are negative.   PHYSICAL EXAMINATION:  ECOG FS:2 - Symptomatic, <50% confined to bed  Vitals:   09/03/19 0916  BP: (!) 154/55  Pulse: 62  Resp: 16  Temp: 98 F (36.7 C)  SpO2: 100%   Wt Readings from Last 3 Encounters:  09/03/19 137 lb 12.8 oz (62.5 kg)  08/04/19 135 lb 8 oz (61.5 kg)  07/21/19 134 lb 12 oz (61.1 kg)   Body mass index is 27.83 kg/m.    Exam was given in a chair   GENERAL:alert, in no acute distress and comfortable SKIN: no acute rashes, no significant lesions EYES: conjunctiva are pink and non-injected, sclera anicteric OROPHARYNX: MMM, no exudates, no oropharyngeal erythema or ulceration NECK: supple, no JVD LYMPH:  no palpable lymphadenopathy in the cervical, axillary or inguinal regions LUNGS: clear to auscultation b/l with normal respiratory effort HEART: regular  rate & rhythm ABDOMEN:  normoactive bowel sounds , non tender, not distended. Extremity: no pedal edema PSYCH: alert & oriented x 3 with fluent speech NEURO: no focal motor/sensory deficits  LABORATORY DATA:  I have reviewed the data as listed  . CBC Latest Ref Rng & Units 09/03/2019 08/18/2019 08/04/2019  WBC 4.0 - 10.5 K/uL 6.8 9.0 9.2  Hemoglobin 12.0 - 15.0 g/dL 12.0 12.1 11.5(L)  Hematocrit 36.0 - 46.0 % 37.7 38.3 36.4  Platelets 150 - 400 K/uL 244 241 290    CBC    Component Value Date/Time   WBC 6.8 09/03/2019 0845   RBC 4.10 09/03/2019 0845   HGB 12.0 09/03/2019 0845   HGB 11.4 (L) 09/11/2018 0900   HGB 11.2 (L) 04/04/2017 0830   HCT 37.7 09/03/2019 0845   HCT 35.2 04/04/2017 0830   PLT 244 09/03/2019 0845   PLT 277 09/11/2018 0900   PLT 250 04/04/2017 0830   MCV 92.0 09/03/2019 0845   MCV 107.6 (H) 04/04/2017 0830   MCH 29.3 09/03/2019 0845   MCHC 31.8 09/03/2019 0845   RDW 14.4 09/03/2019 0845   RDW 14.0 04/04/2017 0830   LYMPHSABS 1.0 09/03/2019 0845   LYMPHSABS 1.6 04/04/2017 0830   MONOABS 0.8 09/03/2019 0845   MONOABS 0.4 04/04/2017 0830   EOSABS 0.1 09/03/2019 0845   EOSABS 0.1 04/04/2017 0830   BASOSABS 0.1 09/03/2019 0845   BASOSABS 0.0 04/04/2017 0830     . CMP Latest Ref Rng & Units 09/03/2019 08/18/2019 08/04/2019  Glucose 70 - 99 mg/dL 135(H) 128(H) 162(H)  BUN 8 - 23 mg/dL 34(H) 35(H) 21  Creatinine 0.44 - 1.00 mg/dL 1.53(H) 1.58(H) 1.44(H)  Sodium 135 - 145 mmol/L 136 138 138  Potassium 3.5 - 5.1 mmol/L 4.6 4.7 4.3  Chloride 98 - 111 mmol/L 106 102 111  CO2 22 - 32 mmol/L 22 25 20(L)  Calcium 8.9 - 10.3 mg/dL 9.1 9.7 7.8(L)  Total Protein 6.5 - 8.1 g/dL 6.5 6.8 6.5  Total Bilirubin 0.3 - 1.2 mg/dL 0.4 0.5 0.4  Alkaline Phos 38 - 126 U/L 60 59 50  AST 15 -  41 U/L 24 13(L) 14(L)  ALT 0 - 44 U/L 32 18 14   B12 level 299 (OSH)-->455  . Lab Results  Component Value Date   LDH 129 09/03/2019        08/27/18 Surgical Pathology from  Cholecystectomy:         RADIOGRAPHIC STUDIES: I have personally reviewed the radiological images as listed and agreed with the findings in the report. Nm Pet Image Restag (ps) Skull Base To Thigh  Result Date: 01/03/2017 IMPRESSION: 1. There is a new lesion in the hepatic dome which is FDG avid and low in attenuation on CT imaging consistent with metastatic disease. Another region of uptake in the left hepatic lobe posteriorly demonstrates no CT correlate. A second subtle metastasis is not excluded on today's study. An MRI could better assess for other hepatic metastases if clinically warranted. 2. New metastatic lesion in the left side of T8. 3. New pulmonary nodule in the right upper lobe. This is too small to characterize but suspicious. Recommend attention on follow-up. No FDG avid nodules or other enlarging nodules. 4. The uptake at the previous left renal artery has almost resolved in the interval and is favored to be post therapeutic. Recommend attention on follow-up. 5. The metastasis in the posterior right hilum seen previously has resolved. Electronically Signed   By: Dorise Bullion III M.D   On: 01/03/2017 11:16   .No results found.  ASSESSMENT & PLAN:    79 y.o. 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 PET/CT 06/19/2017 -- consistent with partial metabolic response to treatment.  Rt adrenal gland bx - showed clear cell RCC  S/p CYtoreductive left radical nephrectomy and left adrenal gland resection on 08/01/2016 by Dr Alinda Money.  10/16/17 PET/CT revealed Continued improvement, with the T7 metastatic lesion no longer significantly hypermetabolic. The previous right lower lobe pulmonary nodule is even less apparent, perhaps about 2 mm in diameter today; given that this measured 6 mm on 03/28/2017 this probably represents an effectively treated  metastatic lesion. Currently no appreciable hypermetabolic activity is identified to suggest active malignancy. Distended gallbladder with gallbladder wall thickening and gallstones. Correlate clinically in assessing for cholecystitis. Small but abnormal amount of free pelvic fluid, nonspecific. Other imaging findings of potential clinical significance: Chronic ethmoid sinusitis. Aortic Atherosclerosis. Stable 5 mm right middle lobe pulmonary nodule, not hypermetabolic but below sensitive PET-CT size thresholds. Left nephrectomy. Notable pelvic floor laxity with cystocele. Chronic bilateral Sacroiliitis.  04/03/18 PET/CT revealed No evidence for new or progressive hypermetabolic disease on today's study to suggest metastatic progression. 2. Stable appearance of the T7 metastatic lesion without Hypermetabolism. 3. Tiny focus of FDG accumulation identified along the skin of the low right inguinal fold. No associated lesion evident on CT. This may be related to urinary contaminant. 4. Cholelithiasis with similar appearance of diffuse gallbladder wall thickening. 5. Stable 5 mm right middle lobe pulmonary nodule. 6.  Aortic Atherosclerois. 7. Diffuse colonic diverticulosis.   09/18/18 CT A/P revealed "Common duct stent in place. Improvement to resolution in biliary duct dilatation compared to 08/29/2018. Interval cholecystectomy without acute complication. 2. Left nephrectomy, without evidence of metastatic disease. 3.  Tiny hiatal hernia. 4. Coronary artery atherosclerosis. Aortic Atherosclerosis. 5. Mild limitations secondary to lack of IV contrast. 6. Marked pelvic floor laxity with cystocele and rectal prolapse".  07/02/2019 PET/CT ([id number]) which revealed "1. No evidence of hypermetabolic  metastatic disease."  #2 b/l adrenal metastases from [place] s/p left adrenalectomy with adrenal insuff - follows with Dr Buddy Duty.  #3 Small pulmonary lesions -- 10/16/17 PET/CT showed pulmonary less apparent than 03/28/17  PET/CT, decreasing from 52mm to 49mm diameter.   MRI brain shows no evidence of metastatic disease  #4 T7/8 Bone metastases - received Xgeva every 4 weeks from May 2018 to June 2019. 10/16/17 PET/CT revealed improvement with T7 metastatic lesion no longer significantly hypermetabolic.  -on Marchelle Folks  #5 ?liver mets- rpt PET/CT from 10/16/2017 shows no overt evidence of metastatic disease in the liver.  #6 Grade 1 Nausea - improved and intermittent. Hasnt used her anti-emetic as instructed so some nausea and decreased po intake.  #7 Grade 1 Diarrhea - resolved  #8 Hyponatremia -  resolved with sodium at 136 - likely related to some element of adrenal insufficiency, diarrhea, limited by mouth intake. Primarily solute free fluid intake.   #9 Acute on chronic renal insufficiency Creatine stable 1.44  #10 Hyperkalemia due to ACEI + RF- resolved  #11 Hemorrhoids -chronic with some bleeding --Recommended Sitz bath and OTC Anusol or Nupercaine for her hemorrhoid relief. -f/u with PCP for continued mx   #12 Moderate protein calorie malnutrition Weight has stabilized and improved Wt Readings from Last 3 Encounters:  09/03/19 137 lb 12.8 oz (62.5 kg)  08/04/19 135 lb 8 oz (61.5 kg)  07/21/19 134 lb 12 oz (61.1 kg)   Plan: Continue healthy po intake/diabetic diet -Previously recommended the patient to drink atleast 48-64 oz of fluids daily -f/u with PCP/Cardiology for diuretics management  #13 Hypothyroidism/Adrenal insufficiency/Diabetes -Continue being followed by Dr. Buddy Duty  #14 HTN - ?control. Patient tends to be anxious and has higher blood pressures in the clinic. She can have increased blood pressure from Sutent as well. Plan: -continued close f/u with her PCP /cardiology regarding the many elements necessary for her care that are not directly related to her oncology care. -ACEI held due to AKI and hyperkalemia - following with cardiology to mx this. Has been started on Amlodipine  instead.  #15 Grade 1 mucositis- resolved -Advised the patient to continue use of Magic Mouthwash to aid with nutrition  #16 Newly diagnosed ?CAD - positive cardiac nuclear stress test Following with cardiology and nephrology   #17 h/o Choledocholithiasis and cholelithiasis 07/10/18 US Abdomen revealed Biliary duct dilatation with 8 mm calculus at the level of the ampulla in the distal common bile duct. 2. Cholelithiasis. No gallbladder wall thickening or pericholecystic fluid. 3. Appearance of the liver raises concern for underlying hepatic cirrhosis. No focal liver lesions are demonstrable. 4.  Left kidney absent. 5.  Small cysts in right kidney. 6.  Aortic Atherosclerosis.  S/p ERCP on 07/23/18 with Dr. Valarie Merino Mansouraty  09/02/18 Cytopathology from ERCP was suspicious for malignant cells in bile duct, but was not definitive, other two findings were benign.  PLAN:  -Discussed pt labwork today, 09/03/19; of CBC w/diff and CMP is as follows: all values are WNL except for Abs Immature Granulocytes at 0.13K, Glucose at 135, BUN at 34, Creatinine at 1.53, GFR, Est Non Af Am at 32, GFR, Est AFR Am at 38 -Discussed 09/03/19 of LDH at 129: WNL -The pt has no prohibitive toxicities from continuing C39D1 of Nivolumab at this time -Recommended that the pt continue to eat well, drink at least 48-64 oz of water each day, and walk 20-30 minutes each day.  -Will see in 6 weeks  FOLLOW UP: Please schedule  next 4 cycles of Nivolumab every 2 weeks with portflush and labs MD visit in 6 weeks  The total time spent in the appt was 30 minutes and more than 50% was on counseling and direct patient cares, immunotherapy orders and treatment management  All of the patient's questions were answered with apparent satisfaction. The patient knows to call the clinic with any problems, questions or concerns.  Sullivan Lone MD [employer] AAHIVMS [facility] Hematology/Oncology Physician [facility]  (Office):        [phone] (Work cell):  [phone] (Fax):           [phone]  I, Dawayne Cirri am acting as a scribe for Dr. Sullivan Lone.   .I have reviewed the above documentation for accuracy and completeness, and I agree with the above. Brunetta Genera MD

## 2019-09-03 ENCOUNTER — Inpatient Hospital Stay: Payer: Medicare Other

## 2019-09-03 ENCOUNTER — Other Ambulatory Visit: Payer: Self-pay

## 2019-09-03 ENCOUNTER — Inpatient Hospital Stay: Payer: Medicare Other | Attending: Hematology

## 2019-09-03 ENCOUNTER — Inpatient Hospital Stay (HOSPITAL_BASED_OUTPATIENT_CLINIC_OR_DEPARTMENT_OTHER): Payer: Medicare Other | Admitting: Hematology

## 2019-09-03 ENCOUNTER — Telehealth: Payer: Self-pay | Admitting: Hematology

## 2019-09-03 VITALS — BP 154/55 | HR 62 | Temp 98.0°F | Resp 16 | Ht 59.0 in | Wt 137.8 lb

## 2019-09-03 DIAGNOSIS — C642 Malignant neoplasm of left kidney, except renal pelvis: Secondary | ICD-10-CM | POA: Diagnosis not present

## 2019-09-03 DIAGNOSIS — E871 Hypo-osmolality and hyponatremia: Secondary | ICD-10-CM | POA: Insufficient documentation

## 2019-09-03 DIAGNOSIS — E538 Deficiency of other specified B group vitamins: Secondary | ICD-10-CM | POA: Insufficient documentation

## 2019-09-03 DIAGNOSIS — I10 Essential (primary) hypertension: Secondary | ICD-10-CM | POA: Diagnosis not present

## 2019-09-03 DIAGNOSIS — C787 Secondary malignant neoplasm of liver and intrahepatic bile duct: Secondary | ICD-10-CM | POA: Diagnosis not present

## 2019-09-03 DIAGNOSIS — M129 Arthropathy, unspecified: Secondary | ICD-10-CM | POA: Diagnosis not present

## 2019-09-03 DIAGNOSIS — Z87891 Personal history of nicotine dependence: Secondary | ICD-10-CM | POA: Insufficient documentation

## 2019-09-03 DIAGNOSIS — C7800 Secondary malignant neoplasm of unspecified lung: Secondary | ICD-10-CM | POA: Diagnosis not present

## 2019-09-03 DIAGNOSIS — R197 Diarrhea, unspecified: Secondary | ICD-10-CM | POA: Insufficient documentation

## 2019-09-03 DIAGNOSIS — E039 Hypothyroidism, unspecified: Secondary | ICD-10-CM | POA: Insufficient documentation

## 2019-09-03 DIAGNOSIS — Z5112 Encounter for antineoplastic immunotherapy: Secondary | ICD-10-CM

## 2019-09-03 DIAGNOSIS — C7951 Secondary malignant neoplasm of bone: Secondary | ICD-10-CM | POA: Diagnosis not present

## 2019-09-03 DIAGNOSIS — I252 Old myocardial infarction: Secondary | ICD-10-CM | POA: Diagnosis not present

## 2019-09-03 DIAGNOSIS — I2510 Atherosclerotic heart disease of native coronary artery without angina pectoris: Secondary | ICD-10-CM | POA: Diagnosis not present

## 2019-09-03 DIAGNOSIS — K8050 Calculus of bile duct without cholangitis or cholecystitis without obstruction: Secondary | ICD-10-CM | POA: Insufficient documentation

## 2019-09-03 DIAGNOSIS — E860 Dehydration: Secondary | ICD-10-CM | POA: Insufficient documentation

## 2019-09-03 DIAGNOSIS — Z7189 Other specified counseling: Secondary | ICD-10-CM

## 2019-09-03 DIAGNOSIS — E119 Type 2 diabetes mellitus without complications: Secondary | ICD-10-CM | POA: Diagnosis not present

## 2019-09-03 DIAGNOSIS — Z95828 Presence of other vascular implants and grafts: Secondary | ICD-10-CM

## 2019-09-03 DIAGNOSIS — R110 Nausea: Secondary | ICD-10-CM | POA: Diagnosis not present

## 2019-09-03 DIAGNOSIS — E785 Hyperlipidemia, unspecified: Secondary | ICD-10-CM | POA: Diagnosis not present

## 2019-09-03 DIAGNOSIS — E875 Hyperkalemia: Secondary | ICD-10-CM | POA: Diagnosis not present

## 2019-09-03 DIAGNOSIS — K649 Unspecified hemorrhoids: Secondary | ICD-10-CM | POA: Diagnosis not present

## 2019-09-03 LAB — CMP (CANCER CENTER ONLY)
ALT: 32 U/L (ref 0–44)
AST: 24 U/L (ref 15–41)
Albumin: 3.5 g/dL (ref 3.5–5.0)
Alkaline Phosphatase: 60 U/L (ref 38–126)
Anion gap: 8 (ref 5–15)
BUN: 34 mg/dL — ABNORMAL HIGH (ref 8–23)
CO2: 22 mmol/L (ref 22–32)
Calcium: 9.1 mg/dL (ref 8.9–10.3)
Chloride: 106 mmol/L (ref 98–111)
Creatinine: 1.53 mg/dL — ABNORMAL HIGH (ref 0.44–1.00)
GFR, Est AFR Am: 38 mL/min — ABNORMAL LOW (ref >60)
GFR, Estimated: 32 mL/min — ABNORMAL LOW (ref >60)
Glucose, Bld: 135 mg/dL — ABNORMAL HIGH (ref 70–99)
Potassium: 4.6 mmol/L (ref 3.5–5.1)
Sodium: 136 mmol/L (ref 135–145)
Total Bilirubin: 0.4 mg/dL (ref 0.3–1.2)
Total Protein: 6.5 g/dL (ref 6.5–8.1)

## 2019-09-03 LAB — CBC WITH DIFFERENTIAL/PLATELET
Abs Immature Granulocytes: 0.13 10*3/uL — ABNORMAL HIGH (ref 0.00–0.07)
Basophils Absolute: 0.1 10*3/uL (ref 0.0–0.1)
Basophils Relative: 1 %
Eosinophils Absolute: 0.1 10*3/uL (ref 0.0–0.5)
Eosinophils Relative: 2 %
HCT: 37.7 % (ref 36.0–46.0)
Hemoglobin: 12 g/dL (ref 12.0–15.0)
Immature Granulocytes: 2 %
Lymphocytes Relative: 15 %
Lymphs Abs: 1 10*3/uL (ref 0.7–4.0)
MCH: 29.3 pg (ref 26.0–34.0)
MCHC: 31.8 g/dL (ref 30.0–36.0)
MCV: 92 fL (ref 80.0–100.0)
Monocytes Absolute: 0.8 10*3/uL (ref 0.1–1.0)
Monocytes Relative: 12 %
Neutro Abs: 4.6 10*3/uL (ref 1.7–7.7)
Neutrophils Relative %: 68 %
Platelets: 244 10*3/uL (ref 150–400)
RBC: 4.1 MIL/uL (ref 3.87–5.11)
RDW: 14.4 % (ref 11.5–15.5)
WBC: 6.8 10*3/uL (ref 4.0–10.5)
nRBC: 0 % (ref 0.0–0.2)

## 2019-09-03 LAB — LACTATE DEHYDROGENASE: LDH: 129 U/L (ref 98–192)

## 2019-09-03 MED ORDER — SODIUM CHLORIDE 0.9 % IV SOLN
240.0000 mg | Freq: Once | INTRAVENOUS | Status: AC
  Administered 2019-09-03: 240 mg via INTRAVENOUS
  Filled 2019-09-03: qty 24

## 2019-09-03 MED ORDER — HEPARIN SOD (PORK) LOCK FLUSH 100 UNIT/ML IV SOLN
500.0000 [IU] | Freq: Once | INTRAVENOUS | Status: AC | PRN
  Administered 2019-09-03: 500 [IU]
  Filled 2019-09-03: qty 5

## 2019-09-03 MED ORDER — SODIUM CHLORIDE 0.9% FLUSH
10.0000 mL | INTRAVENOUS | Status: DC | PRN
  Administered 2019-09-03: 10 mL
  Filled 2019-09-03: qty 10

## 2019-09-03 MED ORDER — SODIUM CHLORIDE 0.9% FLUSH
10.0000 mL | Freq: Once | INTRAVENOUS | Status: AC
  Administered 2019-09-03: 10 mL
  Filled 2019-09-03: qty 10

## 2019-09-03 MED ORDER — SODIUM CHLORIDE 0.9 % IV SOLN
Freq: Once | INTRAVENOUS | Status: AC
  Filled 2019-09-03: qty 250

## 2019-09-03 NOTE — Telephone Encounter (Signed)
Scheduled appt per 5/14 los- gave patient calender

## 2019-09-03 NOTE — Progress Notes (Signed)
Verbal order / Dr.Kale: Ok to receive nivolumab today with Cr 1.53

## 2019-09-03 NOTE — Patient Instructions (Signed)
[facility] Discharge Instructions for Patients Receiving Chemotherapy  Today you received the following chemotherapy agents Opdivo  To help prevent nausea and vomiting after your treatment, we encourage you to take your nausea medication as directed   If you develop nausea and vomiting that is not controlled by your nausea medication, call the clinic.   BELOW ARE SYMPTOMS THAT SHOULD BE REPORTED IMMEDIATELY:  *FEVER GREATER THAN 100.5 F  *CHILLS WITH OR WITHOUT FEVER  NAUSEA AND VOMITING THAT IS NOT CONTROLLED WITH YOUR NAUSEA MEDICATION  *UNUSUAL SHORTNESS OF BREATH  *UNUSUAL BRUISING OR BLEEDING  TENDERNESS IN MOUTH AND THROAT WITH OR WITHOUT PRESENCE OF ULCERS  *URINARY PROBLEMS  *BOWEL PROBLEMS  UNUSUAL RASH Items with * indicate a potential emergency and should be followed up as soon as possible.  Feel free to call the clinic should you have any questions or concerns. The clinic phone number is [phone].  Please show the CHEMO ALERT CARD at check-in to the Emergency Department and triage nurse.   

## 2019-09-09 NOTE — Progress Notes (Signed)
Pharmacist Chemotherapy Monitoring - Follow Up Assessment    I verify that I have reviewed each item in the below checklist:  . Regimen for the patient is scheduled for the appropriate day and plan matches scheduled date. Marland Kitchen Appropriate non-routine labs are ordered dependent on drug ordered. . If applicable, additional medications reviewed and ordered per protocol based on lifetime cumulative doses and/or treatment regimen.   Plan for follow-up and/or issues identified: No . I-vent associated with next due treatment: No . MD and/or nursing notified: No  Latissa Frick K 09/09/2019 11:19 AM

## 2019-09-15 ENCOUNTER — Ambulatory Visit: Payer: Medicare Other

## 2019-09-15 ENCOUNTER — Inpatient Hospital Stay: Payer: Medicare Other

## 2019-09-15 ENCOUNTER — Other Ambulatory Visit: Payer: Medicare Other

## 2019-09-15 ENCOUNTER — Ambulatory Visit: Payer: Medicare Other | Admitting: Hematology

## 2019-09-15 ENCOUNTER — Other Ambulatory Visit: Payer: Self-pay

## 2019-09-15 VITALS — BP 130/66 | HR 70 | Temp 97.9°F | Resp 18

## 2019-09-15 DIAGNOSIS — C642 Malignant neoplasm of left kidney, except renal pelvis: Secondary | ICD-10-CM

## 2019-09-15 DIAGNOSIS — C7951 Secondary malignant neoplasm of bone: Secondary | ICD-10-CM

## 2019-09-15 DIAGNOSIS — Z7189 Other specified counseling: Secondary | ICD-10-CM

## 2019-09-15 DIAGNOSIS — Z5112 Encounter for antineoplastic immunotherapy: Secondary | ICD-10-CM | POA: Diagnosis not present

## 2019-09-15 DIAGNOSIS — Z95828 Presence of other vascular implants and grafts: Secondary | ICD-10-CM

## 2019-09-15 LAB — CBC WITH DIFFERENTIAL/PLATELET
Abs Immature Granulocytes: 0.14 10*3/uL — ABNORMAL HIGH (ref 0.00–0.07)
Basophils Absolute: 0.1 10*3/uL (ref 0.0–0.1)
Basophils Relative: 1 %
Eosinophils Absolute: 0.2 10*3/uL (ref 0.0–0.5)
Eosinophils Relative: 2 %
HCT: 38.2 % (ref 36.0–46.0)
Hemoglobin: 12.3 g/dL (ref 12.0–15.0)
Immature Granulocytes: 2 %
Lymphocytes Relative: 14 %
Lymphs Abs: 1.2 10*3/uL (ref 0.7–4.0)
MCH: 29.1 pg (ref 26.0–34.0)
MCHC: 32.2 g/dL (ref 30.0–36.0)
MCV: 90.5 fL (ref 80.0–100.0)
Monocytes Absolute: 0.9 10*3/uL (ref 0.1–1.0)
Monocytes Relative: 11 %
Neutro Abs: 6 10*3/uL (ref 1.7–7.7)
Neutrophils Relative %: 70 %
Platelets: 246 10*3/uL (ref 150–400)
RBC: 4.22 MIL/uL (ref 3.87–5.11)
RDW: 14.5 % (ref 11.5–15.5)
WBC: 8.5 10*3/uL (ref 4.0–10.5)
nRBC: 0 % (ref 0.0–0.2)

## 2019-09-15 LAB — CMP (CANCER CENTER ONLY)
ALT: 27 U/L (ref 0–44)
AST: 15 U/L (ref 15–41)
Albumin: 3.6 g/dL (ref 3.5–5.0)
Alkaline Phosphatase: 54 U/L (ref 38–126)
Anion gap: 9 (ref 5–15)
BUN: 28 mg/dL — ABNORMAL HIGH (ref 8–23)
CO2: 22 mmol/L (ref 22–32)
Calcium: 9.7 mg/dL (ref 8.9–10.3)
Chloride: 105 mmol/L (ref 98–111)
Creatinine: 1.53 mg/dL — ABNORMAL HIGH (ref 0.44–1.00)
GFR, Est AFR Am: 38 mL/min — ABNORMAL LOW (ref >60)
GFR, Estimated: 32 mL/min — ABNORMAL LOW (ref >60)
Glucose, Bld: 152 mg/dL — ABNORMAL HIGH (ref 70–99)
Potassium: 5.2 mmol/L — ABNORMAL HIGH (ref 3.5–5.1)
Sodium: 136 mmol/L (ref 135–145)
Total Bilirubin: 0.6 mg/dL (ref 0.3–1.2)
Total Protein: 6.8 g/dL (ref 6.5–8.1)

## 2019-09-15 LAB — LACTATE DEHYDROGENASE: LDH: 148 U/L (ref 98–192)

## 2019-09-15 MED ORDER — SODIUM CHLORIDE 0.9 % IV SOLN
240.0000 mg | Freq: Once | INTRAVENOUS | Status: AC
  Administered 2019-09-15: 240 mg via INTRAVENOUS
  Filled 2019-09-15: qty 24

## 2019-09-15 MED ORDER — DENOSUMAB 120 MG/1.7ML ~~LOC~~ SOLN
120.0000 mg | Freq: Once | SUBCUTANEOUS | Status: AC
  Administered 2019-09-15: 120 mg via SUBCUTANEOUS

## 2019-09-15 MED ORDER — HEPARIN SOD (PORK) LOCK FLUSH 100 UNIT/ML IV SOLN
500.0000 [IU] | Freq: Once | INTRAVENOUS | Status: AC | PRN
  Administered 2019-09-15: 500 [IU]
  Filled 2019-09-15: qty 5

## 2019-09-15 MED ORDER — SODIUM CHLORIDE 0.9% FLUSH
10.0000 mL | INTRAVENOUS | Status: DC | PRN
  Administered 2019-09-15: 10 mL
  Filled 2019-09-15: qty 10

## 2019-09-15 MED ORDER — SODIUM CHLORIDE 0.9 % IV SOLN
Freq: Once | INTRAVENOUS | Status: AC
  Filled 2019-09-15: qty 250

## 2019-09-15 MED ORDER — DENOSUMAB 120 MG/1.7ML ~~LOC~~ SOLN
SUBCUTANEOUS | Status: AC
  Filled 2019-09-15: qty 1.7

## 2019-09-15 NOTE — Progress Notes (Signed)
Per Dr. Kale, ok to treat with Scr 1.53. 

## 2019-09-15 NOTE — Patient Instructions (Signed)

## 2019-09-15 NOTE — Patient Instructions (Signed)
[facility] Discharge Instructions for Patients Receiving Chemotherapy  Today you received the following chemotherapy agents Opdivo  To help prevent nausea and vomiting after your treatment, we encourage you to take your nausea medication as directed   If you develop nausea and vomiting that is not controlled by your nausea medication, call the clinic.   BELOW ARE SYMPTOMS THAT SHOULD BE REPORTED IMMEDIATELY:  *FEVER GREATER THAN 100.5 F  *CHILLS WITH OR WITHOUT FEVER  NAUSEA AND VOMITING THAT IS NOT CONTROLLED WITH YOUR NAUSEA MEDICATION  *UNUSUAL SHORTNESS OF BREATH  *UNUSUAL BRUISING OR BLEEDING  TENDERNESS IN MOUTH AND THROAT WITH OR WITHOUT PRESENCE OF ULCERS  *URINARY PROBLEMS  *BOWEL PROBLEMS  UNUSUAL RASH Items with * indicate a potential emergency and should be followed up as soon as possible.  Feel free to call the clinic should you have any questions or concerns. The clinic phone number is [phone].  Please show the CHEMO ALERT CARD at check-in to the Emergency Department and triage nurse.   

## 2019-09-16 ENCOUNTER — Encounter: Payer: Medicare Other | Attending: Internal Medicine | Admitting: Dietician

## 2019-09-16 ENCOUNTER — Encounter: Payer: Self-pay | Admitting: Dietician

## 2019-09-16 ENCOUNTER — Other Ambulatory Visit: Payer: Self-pay

## 2019-09-16 DIAGNOSIS — Z794 Long term (current) use of insulin: Secondary | ICD-10-CM | POA: Diagnosis present

## 2019-09-16 DIAGNOSIS — E1169 Type 2 diabetes mellitus with other specified complication: Secondary | ICD-10-CM | POA: Diagnosis present

## 2019-09-16 DIAGNOSIS — N1832 Chronic kidney disease, stage 3b: Secondary | ICD-10-CM | POA: Diagnosis present

## 2019-09-16 NOTE — Patient Instructions (Addendum)
Recommend reducing the amount of salt that you use in cooking. See list for fruits and vegetables that are low in potassium.  Leach potatoes and sweet potatoes by soaking thinly cut pealed potatoes in a large amount of water for 4 hours or overnight.  Drain water and [employer]. Read labels and avoid those foods that have potassium or phos... in the ingredient list.  Look for foods that are less than 8% sodium. Consider changing to a light colored diet soda such as sprite or gingerale. Encourage improvement of your water intake.  Resources include:  [place].com and Kidney.org

## 2019-09-16 NOTE — Progress Notes (Signed)
Medical Nutrition Therapy:  Appt start time: 1100 end time:  1200.  Assessment:  Primary concerns today:  Patient is here today alone.  She would like to know more about diabetes to help her blood sugar as well as how to eat with renal concerns. She states that she started a new diabetes medication and this has helped.  Fasting blood sugar was in the 200's but now 138 today. She limits many complex carbohydrates but eats more sweets as she enjoys baking.   She states that she does not drink enough water. No added salt at the table but does add salt in cooking and eats out occasionally.  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  Left kidney and partial adrenal gland were removed.  She reports some of the cancer is in remission.  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  Other history includes hypothyroidism, CKD, and neuropathy.  Labs noted to include:  Potassium 5.2, BUN 28, Creatinine 1.53, eGFR 32 09/15/2019, A1C 8.5% 02/2019 decreased from 9.1% 08/27/2018 Medications include:  Januvia and Glimepiride.    Weight hx: 137 lbs today  124 lbs after illness in 2018 165 lbs prior to illness.  Patient lives with her husband.  They share shopping and cooking. She states that he is an Firefighter with sweet foods.  Preferred Learning Style:   No preference indicated   Learning Readiness:   Change in progress  DIETARY INTAKE: Limits complex carbohydrates but eats sweets as she likes to bake. Avoided foods include No added salt at the table but does use salt in her cooking.    24-hr recall:  B (8AM): 2 eggs, diet whole grain toast, coffee with splenda and coffeemate Snk ( AM): none  L ( PM): cheese sticks and crackers OR apple and peanut butter Snk ( PM): none D (4 PM): meat, 2 vegetables (rare starch) Snk ( PM): sugar free cookie Beverages: water, coffee with splenda and coffeemate, 1 diet soda per day, fruit  flavored water  Usual physical activity: ADL's  Progress Towards Goal(s):  In progress.   Nutritional Diagnosis:  NB-1.1 Food and nutrition-related knowledge deficit As related to balance of protein, carbohydrates, and fat.  As evidenced by diet hx and patient report.    Intervention:  Nutrition education related to diabetes and CKD.  Discussed the discrepancy of purposely avoiding foods like rice yet having increased sweets that she enjoys making.  Also, discussed the balance of life and personal choice of balance of carbohydrates along with nutritional parameters.   Discussed a low potassium, low sodium diet due to CKD as well as tips to limit phosphorous.  Discussed that plant protein is easier to metabolize and may have a different effect on the kidney.  She states that she does not like a lot of meat.  Discussed for her to limit protion size. Encouraged increased water intake.  Recommended to avoid dark soda and foods with phos... in the ingredient list.  Plan: Recommend reducing the amount of salt that you use in cooking. See list for fruits and vegetables that are low in potassium.  Leach potatoes and sweet potatoes by soaking thinly cut pealed potatoes in a large amount of water for 4 hours or overnight.  Drain water and [employer]. Read labels and avoid those foods that have potassium or phos... in the ingredient list.  Look for foods that are less than 8% sodium. Consider changing to a  light colored diet soda such as sprite or gingerale. Encourage improvement of your water intake.  Teaching Method Utilized:  Visual Auditory  Handouts given during visit include:  National Kidney Diet placemat for those not on dialysis  A couple of recipes from [email or web address].com (suggested reducing the sugar in any booked goods)  Barriers to learning/adherence to lifestyle change: health issues  Demonstrated degree of understanding via:  Teach Back   Monitoring/Evaluation:  Dietary intake, exercise,  label reading, and body weight prn.

## 2019-09-17 ENCOUNTER — Other Ambulatory Visit: Payer: Medicare Other

## 2019-09-17 ENCOUNTER — Ambulatory Visit: Payer: Medicare Other

## 2019-09-23 NOTE — Progress Notes (Signed)
Pharmacist Chemotherapy Monitoring - Follow Up Assessment    I verify that I have reviewed each item in the below checklist:  . Regimen for the patient is scheduled for the appropriate day and plan matches scheduled date. Marland Kitchen Appropriate non-routine labs are ordered dependent on drug ordered. . If applicable, additional medications reviewed and ordered per protocol based on lifetime cumulative doses and/or treatment regimen.   Plan for follow-up and/or issues identified: No . I-vent associated with next due treatment: No . MD and/or nursing notified: No  Audrey [name] 09/23/2019 10:41 AM

## 2019-09-29 ENCOUNTER — Ambulatory Visit: Payer: Medicare Other

## 2019-09-29 ENCOUNTER — Other Ambulatory Visit: Payer: Self-pay

## 2019-09-29 ENCOUNTER — Other Ambulatory Visit: Payer: Medicare Other

## 2019-09-29 ENCOUNTER — Inpatient Hospital Stay: Payer: Medicare Other | Attending: Hematology

## 2019-09-29 ENCOUNTER — Inpatient Hospital Stay: Payer: Medicare Other

## 2019-09-29 ENCOUNTER — Other Ambulatory Visit: Payer: Self-pay | Admitting: Hematology

## 2019-09-29 VITALS — BP 138/72 | HR 68 | Temp 98.0°F | Resp 18

## 2019-09-29 DIAGNOSIS — E039 Hypothyroidism, unspecified: Secondary | ICD-10-CM | POA: Insufficient documentation

## 2019-09-29 DIAGNOSIS — C7801 Secondary malignant neoplasm of right lung: Secondary | ICD-10-CM | POA: Insufficient documentation

## 2019-09-29 DIAGNOSIS — N189 Chronic kidney disease, unspecified: Secondary | ICD-10-CM | POA: Insufficient documentation

## 2019-09-29 DIAGNOSIS — Z8249 Family history of ischemic heart disease and other diseases of the circulatory system: Secondary | ICD-10-CM | POA: Insufficient documentation

## 2019-09-29 DIAGNOSIS — I2510 Atherosclerotic heart disease of native coronary artery without angina pectoris: Secondary | ICD-10-CM | POA: Insufficient documentation

## 2019-09-29 DIAGNOSIS — C7951 Secondary malignant neoplasm of bone: Secondary | ICD-10-CM

## 2019-09-29 DIAGNOSIS — K209 Esophagitis, unspecified without bleeding: Secondary | ICD-10-CM | POA: Diagnosis not present

## 2019-09-29 DIAGNOSIS — E871 Hypo-osmolality and hyponatremia: Secondary | ICD-10-CM | POA: Insufficient documentation

## 2019-09-29 DIAGNOSIS — M1990 Unspecified osteoarthritis, unspecified site: Secondary | ICD-10-CM | POA: Insufficient documentation

## 2019-09-29 DIAGNOSIS — E538 Deficiency of other specified B group vitamins: Secondary | ICD-10-CM | POA: Diagnosis not present

## 2019-09-29 DIAGNOSIS — E785 Hyperlipidemia, unspecified: Secondary | ICD-10-CM | POA: Insufficient documentation

## 2019-09-29 DIAGNOSIS — C642 Malignant neoplasm of left kidney, except renal pelvis: Secondary | ICD-10-CM | POA: Insufficient documentation

## 2019-09-29 DIAGNOSIS — Z87891 Personal history of nicotine dependence: Secondary | ICD-10-CM | POA: Insufficient documentation

## 2019-09-29 DIAGNOSIS — Z95828 Presence of other vascular implants and grafts: Secondary | ICD-10-CM

## 2019-09-29 DIAGNOSIS — K649 Unspecified hemorrhoids: Secondary | ICD-10-CM | POA: Diagnosis not present

## 2019-09-29 DIAGNOSIS — E440 Moderate protein-calorie malnutrition: Secondary | ICD-10-CM | POA: Insufficient documentation

## 2019-09-29 DIAGNOSIS — I700 Atherosclerosis of aorta: Secondary | ICD-10-CM | POA: Diagnosis not present

## 2019-09-29 DIAGNOSIS — R110 Nausea: Secondary | ICD-10-CM | POA: Diagnosis not present

## 2019-09-29 DIAGNOSIS — K449 Diaphragmatic hernia without obstruction or gangrene: Secondary | ICD-10-CM | POA: Diagnosis not present

## 2019-09-29 DIAGNOSIS — Z79899 Other long term (current) drug therapy: Secondary | ICD-10-CM | POA: Diagnosis not present

## 2019-09-29 DIAGNOSIS — I10 Essential (primary) hypertension: Secondary | ICD-10-CM | POA: Diagnosis not present

## 2019-09-29 DIAGNOSIS — Z836 Family history of other diseases of the respiratory system: Secondary | ICD-10-CM | POA: Diagnosis not present

## 2019-09-29 DIAGNOSIS — Z7189 Other specified counseling: Secondary | ICD-10-CM

## 2019-09-29 DIAGNOSIS — E079 Disorder of thyroid, unspecified: Secondary | ICD-10-CM | POA: Insufficient documentation

## 2019-09-29 DIAGNOSIS — C787 Secondary malignant neoplasm of liver and intrahepatic bile duct: Secondary | ICD-10-CM | POA: Diagnosis not present

## 2019-09-29 DIAGNOSIS — Z5112 Encounter for antineoplastic immunotherapy: Secondary | ICD-10-CM

## 2019-09-29 LAB — CBC WITH DIFFERENTIAL/PLATELET
Abs Immature Granulocytes: 0.17 10*3/uL — ABNORMAL HIGH (ref 0.00–0.07)
Basophils Absolute: 0.1 10*3/uL (ref 0.0–0.1)
Basophils Relative: 1 %
Eosinophils Absolute: 0.1 10*3/uL (ref 0.0–0.5)
Eosinophils Relative: 2 %
HCT: 38.1 % (ref 36.0–46.0)
Hemoglobin: 12.3 g/dL (ref 12.0–15.0)
Immature Granulocytes: 2 %
Lymphocytes Relative: 15 %
Lymphs Abs: 1.2 10*3/uL (ref 0.7–4.0)
MCH: 29.2 pg (ref 26.0–34.0)
MCHC: 32.3 g/dL (ref 30.0–36.0)
MCV: 90.5 fL (ref 80.0–100.0)
Monocytes Absolute: 0.9 10*3/uL (ref 0.1–1.0)
Monocytes Relative: 11 %
Neutro Abs: 5.8 10*3/uL (ref 1.7–7.7)
Neutrophils Relative %: 69 %
Platelets: 262 10*3/uL (ref 150–400)
RBC: 4.21 MIL/uL (ref 3.87–5.11)
RDW: 13.9 % (ref 11.5–15.5)
WBC: 8.3 10*3/uL (ref 4.0–10.5)
nRBC: 0 % (ref 0.0–0.2)

## 2019-09-29 LAB — CMP (CANCER CENTER ONLY)
ALT: 58 U/L — ABNORMAL HIGH (ref 0–44)
AST: 22 U/L (ref 15–41)
Albumin: 3.5 g/dL (ref 3.5–5.0)
Alkaline Phosphatase: 67 U/L (ref 38–126)
Anion gap: 13 (ref 5–15)
BUN: 22 mg/dL (ref 8–23)
CO2: 20 mmol/L — ABNORMAL LOW (ref 22–32)
Calcium: 9.9 mg/dL (ref 8.9–10.3)
Chloride: 104 mmol/L (ref 98–111)
Creatinine: 1.74 mg/dL — ABNORMAL HIGH (ref 0.44–1.00)
GFR, Est AFR Am: 32 mL/min — ABNORMAL LOW (ref >60)
GFR, Estimated: 28 mL/min — ABNORMAL LOW (ref >60)
Glucose, Bld: 138 mg/dL — ABNORMAL HIGH (ref 70–99)
Potassium: 4.7 mmol/L (ref 3.5–5.1)
Sodium: 137 mmol/L (ref 135–145)
Total Bilirubin: 0.4 mg/dL (ref 0.3–1.2)
Total Protein: 6.6 g/dL (ref 6.5–8.1)

## 2019-09-29 LAB — LACTATE DEHYDROGENASE: LDH: 115 U/L (ref 98–192)

## 2019-09-29 LAB — TSH: TSH: 0.86 u[IU]/mL (ref 0.308–3.960)

## 2019-09-29 MED ORDER — SODIUM CHLORIDE 0.9 % IV SOLN
Freq: Once | INTRAVENOUS | Status: AC
  Filled 2019-09-29: qty 250

## 2019-09-29 MED ORDER — SODIUM CHLORIDE 0.9 % IV SOLN
240.0000 mg | Freq: Once | INTRAVENOUS | Status: AC
  Administered 2019-09-29: 240 mg via INTRAVENOUS
  Filled 2019-09-29: qty 24

## 2019-09-29 MED ORDER — SODIUM CHLORIDE 0.9% FLUSH
10.0000 mL | Freq: Once | INTRAVENOUS | Status: AC
  Administered 2019-09-29: 10 mL
  Filled 2019-09-29: qty 10

## 2019-09-29 MED ORDER — SODIUM CHLORIDE 0.9% FLUSH
10.0000 mL | INTRAVENOUS | Status: DC | PRN
  Administered 2019-09-29: 10 mL
  Filled 2019-09-29: qty 10

## 2019-09-29 MED ORDER — HEPARIN SOD (PORK) LOCK FLUSH 100 UNIT/ML IV SOLN
500.0000 [IU] | Freq: Once | INTRAVENOUS | Status: AC | PRN
  Administered 2019-09-29: 500 [IU]
  Filled 2019-09-29: qty 5

## 2019-09-29 NOTE — Patient Instructions (Signed)

## 2019-09-29 NOTE — Patient Instructions (Signed)
[facility] Discharge Instructions for Patients Receiving Chemotherapy  Today you received the following chemotherapy agents Opdivo  To help prevent nausea and vomiting after your treatment, we encourage you to take your nausea medication as directed   If you develop nausea and vomiting that is not controlled by your nausea medication, call the clinic.   BELOW ARE SYMPTOMS THAT SHOULD BE REPORTED IMMEDIATELY:  *FEVER GREATER THAN 100.5 F  *CHILLS WITH OR WITHOUT FEVER  NAUSEA AND VOMITING THAT IS NOT CONTROLLED WITH YOUR NAUSEA MEDICATION  *UNUSUAL SHORTNESS OF BREATH  *UNUSUAL BRUISING OR BLEEDING  TENDERNESS IN MOUTH AND THROAT WITH OR WITHOUT PRESENCE OF ULCERS  *URINARY PROBLEMS  *BOWEL PROBLEMS  UNUSUAL RASH Items with * indicate a potential emergency and should be followed up as soon as possible.  Feel free to call the clinic should you have any questions or concerns. The clinic phone number is [phone].  Please show the CHEMO ALERT CARD at check-in to the Emergency Department and triage nurse.   

## 2019-09-29 NOTE — Progress Notes (Signed)
Scr. 1.74 today, Okay to treat per [name].

## 2019-09-30 ENCOUNTER — Other Ambulatory Visit: Payer: Self-pay

## 2019-09-30 DIAGNOSIS — Z9889 Other specified postprocedural states: Secondary | ICD-10-CM

## 2019-09-30 DIAGNOSIS — K831 Obstruction of bile duct: Secondary | ICD-10-CM

## 2019-09-30 NOTE — Progress Notes (Signed)
Pt scheduled ERCP at [facility] per Dr. Rush Landmark.

## 2019-10-01 ENCOUNTER — Other Ambulatory Visit: Payer: Medicare Other

## 2019-10-01 ENCOUNTER — Ambulatory Visit: Payer: Medicare Other

## 2019-10-11 NOTE — Progress Notes (Signed)
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 She is here for C42D1 of Nivolumab. The patient's last visit with [name] was on 09/03/2019. The pt reports that she is doing well overall.  The pt reports that she has her uterine prolapse surgery scheduled for 07/29. Pt has been experiencing stress due to her son's recent health problems. Pt continues to have sciatica from a pinched nerve in her back.   Lab results today (10/12/19) of CBC w/diff and CMP is as follows: all values are WNL except for Hgb at 11.7, Abs Immature Granulocytes at 0.08K, CO2 at 19, Glucose at 117, Creatinine at 1.69, Calcium at 8.8, Albumin at 3.4, GFR Est Non Af Am at 29. 10/12/2019 LDH at 143  On review of systems, pt reports stress, chronic back pain and denies new bone pain, rashes, diarrhea and any other symptoms.    MEDICAL HISTORY:   Past Medical History:  Diagnosis Date  . Anemia   . Arthritis    lower back, hips, hands  . Biliary stricture   . Diabetes mellitus ([place])    type 2 - no meds, diet controlled  . Early cataracts, bilateral   . Elevated liver enzymes   . GERD (gastroesophageal reflux disease)    occasional - diet controlled  . History of hiatal hernia   . HTN (hypertension)   . Hyperlipidemia   . Hypothyroidism   . left renal ca dx'd 2018   renal cancer - left kidney removed, pill chemo x 1 yr  . Myocardial infarction ([place]) 1991   no deficits  . SVD (spontaneous vaginal delivery)    x 3  . Wears glasses     Dyslipidemia Osteoarthritis Ex-smoker Coronary artery disease Thyroid disorder-was apparently on levothyroxine 25 g daily which has subsequently been discontinued . Mitral regurgitation B12 deficiency  hiatal hernia with esophagitis   SURGICAL HISTORY: Past Surgical History:  Procedure Laterality Date  . BALLOON DILATION N/A 07/23/2018   Procedure: BALLOON DILATION;  Surgeon: Rush Landmark Telford Nab., MD;  Location: [facility];  Service: Gastroenterology;  Laterality: N/A;  . BILIARY BRUSHING  08/10/2018   Procedure: BILIARY BRUSHING;  Surgeon: Rush Landmark Telford Nab., MD;  Location: [facility];  Service: Gastroenterology;;  . BILIARY BRUSHING  11/30/2018   Procedure: BILIARY BRUSHING;  Surgeon: Irving Copas., MD;  Location: [facility];  Service: Gastroenterology;;  . BILIARY DILATION  08/10/2018   Procedure: BILIARY DILATION;  Surgeon: Irving Copas., MD;  Location: [facility];  Service: Gastroenterology;;  . BILIARY DILATION  08/27/2018   Procedure: BILIARY DILATION;  Surgeon: Irving Copas., MD;  Location: [facility];  Service: Gastroenterology;;  . BILIARY STENT PLACEMENT  08/10/2018   Procedure: BILIARY STENT PLACEMENT;  Surgeon: Irving Copas., MD;  Location: [facility];  Service: Gastroenterology;;  . BILIARY STENT PLACEMENT  08/27/2018   Procedure: BILIARY STENT PLACEMENT;  Surgeon: Irving Copas., MD;  Location: [facility];  Service: Gastroenterology;;  . BILIARY STENT PLACEMENT  11/30/2018   Procedure: BILIARY STENT PLACEMENT;  Surgeon: Irving Copas., MD;  [facility];  Service: Gastroenterology;;  . BIOPSY  07/23/2018   Procedure: BIOPSY;  Surgeon: Irving Copas., MD;  Location: [facility];  Service: Gastroenterology;;  . CHOLECYSTECTOMY N/A 09/02/2018   Procedure: LAPAROSCOPIC CHOLECYSTECTOMY;  Surgeon: Stark Klein, MD;  Location: [facility];  Service: General;  Laterality: N/A;  . COLONOSCOPY      normal   . ENDOSCOPIC RETROGRADE CHOLANGIOPANCREATOGRAPHY (ERCP) WITH PROPOFOL N/A 08/10/2018   Procedure: ENDOSCOPIC RETROGRADE CHOLANGIOPANCREATOGRAPHY (ERCP) WITH PROPOFOL;  Surgeon: Irving Copas., MD;  Location: [facility];  Service: Gastroenterology;  Laterality: N/A;  . ENDOSCOPIC RETROGRADE CHOLANGIOPANCREATOGRAPHY (ERCP) WITH PROPOFOL N/A 11/30/2018   Procedure: ENDOSCOPIC RETROGRADE CHOLANGIOPANCREATOGRAPHY (ERCP) WITH PROPOFOL;  Surgeon: Rush Landmark Telford Nab., MD;  Location: [facility];  Service: Gastroenterology;  Laterality: N/A;  . ERCP N/A 07/23/2018   Procedure: ENDOSCOPIC RETROGRADE CHOLANGIOPANCREATOGRAPHY (ERCP);  Surgeon: Irving Copas., MD;  Location: [facility];  Service: Gastroenterology;  Laterality: N/A;  . ERCP N/A 08/27/2018   Procedure: ENDOSCOPIC RETROGRADE CHOLANGIOPANCREATOGRAPHY (ERCP);  Surgeon: Irving Copas., MD;  Location: [facility];  Service: Gastroenterology;  Laterality: N/A;  . ESOPHAGOGASTRODUODENOSCOPY (EGD) WITH PROPOFOL N/A 08/27/2018   Procedure: ESOPHAGOGASTRODUODENOSCOPY (EGD) WITH PROPOFOL;  Surgeon: Rush Landmark Telford Nab., MD;  Location: [facility];  Service: Gastroenterology;  Laterality: N/A;  . EUS N/A 08/27/2018   Procedure: ESOPHAGEAL ENDOSCOPIC ULTRASOUND (EUS) RADIAL;  Surgeon: Rush Landmark Telford Nab., MD;  Location: [facility];  Service: Gastroenterology;  Laterality: N/A;  . FINE NEEDLE ASPIRATION  08/27/2018   Procedure: FINE NEEDLE ASPIRATION (FNA) LINEAR;  Surgeon: Irving Copas., MD;  Location: [facility];  Service: Gastroenterology;;  . IR IMAGING GUIDED PORT INSERTION  01/08/2018  . LAPAROSCOPIC NEPHRECTOMY Left 08/01/2016   Procedure: LAPAROSCOPIC  RADICAL NEPHRECTOMY/ REPAIR OF UMBILICAL HERNIA;  Surgeon: Raynelle Bring, MD;  Location: [facility];  Service: Urology;  Laterality: Left;  . REMOVAL OF STONES  07/23/2018   Procedure: REMOVAL OF GALL STONES;  Surgeon: Rush Landmark Telford Nab., MD;   Location: [facility];  Service: Gastroenterology;;  . REMOVAL OF STONES  08/10/2018   Procedure: REMOVAL OF STONES;  Surgeon: Irving Copas., MD;  Location: [facility];  Service: Gastroenterology;;  . REMOVAL OF STONES  08/27/2018   Procedure: REMOVAL OF STONES;  Surgeon: Irving Copas., MD;  Location: [facility];  Service: Gastroenterology;;  . Audrey [name]  07/23/2018   Procedure: Audrey [name];  Surgeon: Irving Copas., MD;  Location: [facility];  Service: Gastroenterology;;  . Lavell Islam REMOVAL  08/27/2018   Procedure: STENT REMOVAL;  Surgeon: Irving Copas., MD;  Location: [facility];  Service: Gastroenterology;;  . Lavell Islam REMOVAL  11/30/2018   Procedure: STENT REMOVAL;  Surgeon: Irving Copas., MD;  Location: [facility];  Service: Gastroenterology;;  . UPPER GI ENDOSCOPY     x 1   No reported past surgeries EGD 05/01/2016 Dr. Earley Brooke Colonoscopy 03/2016 Dr. Earley Brooke  SOCIAL HISTORY: Social History   Socioeconomic History  . Marital status: Married    Spouse name: Not on file  . Number of children: 3  . Years of education: Not on file  . Highest education level: Not on file  Occupational History  . Occupation: retired  Tobacco Use  . Smoking status: Former Smoker    Packs/day: 1.00    Years: 8.00    Pack years: 8.00    Types: Cigarettes    Quit date: 06/18/1964    Years since quitting: 55.3  . Smokeless tobacco: Never Used  Vaping Use  . Vaping Use:  Never used  Substance and Sexual Activity  . Alcohol use: No  . Drug use: No  . Sexual activity: Not on file  Other Topics Concern  . Not on file  Social History Narrative   Married.  Three children   Social Determinants of Health   Financial Resource Strain:   . Difficulty of Paying Living Expenses:   Food Insecurity:   . Worried About Charity fundraiser in the Last Year:   . Arboriculturist in the Last Year:   Transportation Needs:   . Film/video editor  (Medical):   Marland Kitchen Lack of Transportation (Non-Medical):   Physical Activity:   . Days of Exercise per Week:   . Minutes of Exercise per Session:   Stress:   . Feeling of Stress :   Social Connections:   . Frequency of Communication with Friends and Family:   . Frequency of Social Gatherings with Friends and Family:   . Attends Religious Services:   . Active Member of Clubs or Organizations:   . Attends Archivist Meetings:   Marland Kitchen Marital Status:   Intimate Partner Violence:   . Fear of Current or Ex-Partner:   . Emotionally Abused:   Marland Kitchen Physically Abused:   . Sexually Abused:    Patient lives in [place] Works as a Solicitor part-time Ex-smoker quit long time ago In 1961 . Smoked 2 packs per day for 8 years prior to that .  or a lot of stress since her daughter is also getting treated for stage IV uterine cancer.  FAMILY HISTORY:  Mother deceased Father with asthma and emphysema died at 62 years with the MI. Brother deceased of heart disease   ALLERGIES:  is allergic to adhesive [tape], gabapentin, nsaids, and statins.  MEDICATIONS:  Current Outpatient Medications  Medication Sig Dispense Refill  . amLODipine (NORVASC) 5 MG tablet Take 5 mg by mouth daily.   6  . atorvastatin (LIPITOR) 80 MG tablet Take 80 mg by mouth at bedtime.     . carvedilol (COREG) 6.25 MG tablet Take 6.25 mg by mouth 2 (two) times daily.  6  . clobetasol ointment (TEMOVATE) 9.44 % Apply 1 application topically 2 (two) times daily as needed (lichen sclerosis).     . Denosumab (XGEVA [place]) Inject into the skin every 6 (six) weeks.    Marland Kitchen glimepiride (AMARYL) 1 MG tablet Take 1 mg by mouth daily with breakfast.    . hydrocortisone (CORTEF) 10 MG tablet Take 5-10 mg by mouth See admin instructions. Take 1 tablet (10 mg) by mouth in the morning & 1/2 tablet (5 mg) by mouth in the evening.    Marland Kitchen levothyroxine (SYNTHROID) 75 MCG tablet Take 75 mcg by mouth daily before breakfast.     .  lidocaine (LIDODERM) 5 % APPLY 1  TOPICALLY ON RIGHT HIP ONCE DAILY. REMOVE AND DISCARD PATCH WITHIN 12 HOURS OR AS DIRECTED BY DOCTOR. 30 patch 0  . lidocaine-prilocaine (EMLA) cream Apply 1 application topically daily as needed (prior to port access). 30 g 1  . Nivolumab (OPDIVO IV) Inject 1 Dose into the vein every 14 (fourteen) days. [place]    . omeprazole (PRILOSEC) 20 MG capsule Take 1 capsule (20 mg total) by mouth daily. 60 capsule 3  . sitaGLIPtin (JANUVIA) 50 MG tablet Take 50 mg by mouth daily.     No current facility-administered medications for this visit.   Facility-Administered Medications Ordered in Other Visits  Medication Dose Route Frequency Provider Last Rate Last Admin  . sodium chloride flush (NS) 0.9 % injection 10 mL  10 mL Intracatheter PRN Truitt Merle, MD   10 mL at 11/20/18 1232    REVIEW OF SYSTEMS:   A 10+ POINT REVIEW OF SYSTEMS WAS OBTAINED including neurology, dermatology, psychiatry, cardiac, respiratory, lymph, extremities, GI, GU, Musculoskeletal, constitutional, breasts, reproductive, HEENT.  All pertinent positives are noted in the HPI.  All others are negative.   PHYSICAL EXAMINATION:  ECOG FS:2 - Symptomatic, <50% confined to bed  Vitals:   10/12/19 0836  BP: (!) 147/58  Pulse: 63  Resp: 18  Temp: 97.7 F (36.5 C)  SpO2: 100%   Wt Readings from Last 3 Encounters:  10/12/19 137 lb 8 oz (62.4 kg)  09/16/19 137 lb (62.1 kg)  09/03/19 137 lb 12.8 oz (62.5 kg)   Body mass index is 27.77 kg/m.    Exam was given in a chair   GENERAL:alert, in no acute distress and comfortable SKIN: no acute rashes, no significant lesions EYES: conjunctiva are pink and non-injected, sclera anicteric OROPHARYNX: MMM, no exudates, no oropharyngeal erythema or ulceration NECK: supple, no JVD LYMPH:  no palpable lymphadenopathy in the cervical, axillary or inguinal regions LUNGS: clear to auscultation b/l with normal respiratory effort HEART: regular  rate & rhythm ABDOMEN:  normoactive bowel sounds , non tender, not distended. No palpable hepatosplenomegaly.  Extremity: no pedal edema PSYCH: alert & oriented x 3 with fluent speech NEURO: no focal motor/sensory deficits  LABORATORY DATA:  I have reviewed the data as listed  . CBC Latest Ref Rng & Units 10/12/2019 09/29/2019 09/15/2019  WBC 4.0 - 10.5 K/uL 7.6 8.3 8.5  Hemoglobin 12.0 - 15.0 g/dL 11.7(L) 12.3 12.3  Hematocrit 36 - 46 % 36.0 38.1 38.2  Platelets 150 - 400 K/uL 281 262 246    CBC    Component Value Date/Time   WBC 7.6 10/12/2019 0750   RBC 3.96 10/12/2019 0750   HGB 11.7 (L) 10/12/2019 0750   HGB 11.4 (L) 09/11/2018 0900   HGB 11.2 (L) 04/04/2017 0830   HCT 36.0 10/12/2019 0750   HCT 35.2 04/04/2017 0830   PLT 281 10/12/2019 0750   PLT 277 09/11/2018 0900   PLT 250 04/04/2017 0830   MCV 90.9 10/12/2019 0750   MCV 107.6 (H) 04/04/2017 0830   MCH 29.5 10/12/2019 0750   MCHC 32.5 10/12/2019 0750   RDW 14.0 10/12/2019 0750   RDW 14.0 04/04/2017 0830   LYMPHSABS 1.1 10/12/2019 0750   LYMPHSABS 1.6 04/04/2017 0830   MONOABS 0.8 10/12/2019 0750   MONOABS 0.4 04/04/2017 0830   EOSABS 0.2 10/12/2019 0750   EOSABS 0.1 04/04/2017 0830   BASOSABS 0.1 10/12/2019 0750   BASOSABS 0.0 04/04/2017 0830     . CMP Latest Ref Rng & Units 10/12/2019 09/29/2019 09/15/2019  Glucose 70 - 99 mg/dL 117(H) 138(H) 152(H)  BUN 8 - 23 mg/dL 20 22 28(H)  Creatinine 0.44 - 1.00 mg/dL 1.69(H) 1.74(H) 1.53(H)  Sodium 135 - 145 mmol/L 139 137 136  Potassium 3.5 - 5.1 mmol/L 4.4 4.7 5.2(H)  Chloride 98 - 111 mmol/L 110 104 105  CO2 22 - 32 mmol/L 19(L) 20(L) 22  Calcium 8.9 - 10.3 mg/dL 8.8(L) 9.9 9.7  Total Protein 6.5 - 8.1 g/dL 6.5 6.6 6.8  Total Bilirubin 0.3 - 1.2 mg/dL 0.4 0.4 0.6  Alkaline Phos 38 - 126 U/L 60 67 54  AST 15 - 41 U/L 16  22 15  ALT 0 - 44 U/L 21 58(H) 27   B12 level 299 (OSH)-->455  . Lab Results  Component Value Date   LDH 143 10/12/2019         08/27/18 Surgical Pathology from Cholecystectomy:         RADIOGRAPHIC STUDIES: I have personally reviewed the radiological images as listed and agreed with the findings in the report. Nm Pet Image Restag (ps) Skull Base To Thigh  Result Date: 01/03/2017 IMPRESSION: 1. There is a new lesion in the hepatic dome which is FDG avid and low in attenuation on CT imaging consistent with metastatic disease. Another region of uptake in the left hepatic lobe posteriorly demonstrates no CT correlate. A second subtle metastasis is not excluded on today's study. An MRI could better assess for other hepatic metastases if clinically warranted. 2. New metastatic lesion in the left side of T8. 3. New pulmonary nodule in the right upper lobe. This is too small to characterize but suspicious. Recommend attention on follow-up. No FDG avid nodules or other enlarging nodules. 4. The uptake at the previous left renal artery has almost resolved in the interval and is favored to be post therapeutic. Recommend attention on follow-up. 5. The metastasis in the posterior right hilum seen previously has resolved. Electronically Signed   By: Dorise Bullion III M.D   On: 01/03/2017 11:16   .No results found.  ASSESSMENT & PLAN:    79 y.o. 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 PET/CT 06/19/2017 -- consistent with partial metabolic response to treatment.  Rt adrenal gland bx - showed clear cell RCC  S/p CYtoreductive left radical nephrectomy and left adrenal gland resection on 08/01/2016 by Dr Alinda Money.  10/16/17 PET/CT revealed Continued improvement, with the T7 metastatic lesion no longer significantly hypermetabolic. The previous right lower lobe pulmonary nodule is even less apparent, perhaps about 2 mm in diameter today; given that this measured 6 mm on 03/28/2017 this  probably represents an effectively treated metastatic lesion. Currently no appreciable hypermetabolic activity is identified to suggest active malignancy. Distended gallbladder with gallbladder wall thickening and gallstones. Correlate clinically in assessing for cholecystitis. Small but abnormal amount of free pelvic fluid, nonspecific. Other imaging findings of potential clinical significance: Chronic ethmoid sinusitis. Aortic Atherosclerosis. Stable 5 mm right middle lobe pulmonary nodule, not hypermetabolic but below sensitive PET-CT size thresholds. Left nephrectomy. Notable pelvic floor laxity with cystocele. Chronic bilateral Sacroiliitis.  04/03/18 PET/CT revealed No evidence for new or progressive hypermetabolic disease on today's study to suggest metastatic progression. 2. Stable appearance of the T7 metastatic lesion without Hypermetabolism. 3. Tiny focus of FDG accumulation identified along the skin of the low right inguinal fold. No associated lesion evident on CT. This may be related to urinary contaminant. 4. Cholelithiasis with similar appearance of diffuse gallbladder wall thickening. 5. Stable 5 mm right middle lobe pulmonary nodule. 6.  Aortic Atherosclerois. 7. Diffuse colonic diverticulosis.   09/18/18 CT A/P revealed "Common duct stent in place. Improvement to resolution in biliary duct dilatation compared to 08/29/2018. Interval cholecystectomy without acute complication. 2. Left nephrectomy, without evidence of metastatic disease. 3.  Tiny hiatal hernia. 4. Coronary artery atherosclerosis. Aortic Atherosclerosis. 5. Mild limitations secondary to lack of IV contrast. 6. Marked pelvic floor laxity with cystocele and rectal prolapse".  07/02/2019 PET/CT ([id number]) which revealed "1. No evidence of hypermetabolic metastatic disease."  #  2 b/l adrenal metastases from [place] s/p left adrenalectomy with adrenal insuff - follows with Dr Buddy Duty.  #3 Small pulmonary lesions -- 10/16/17 PET/CT  showed pulmonary less apparent than 03/28/17 PET/CT, decreasing from 33mm to 87mm diameter.   MRI brain shows no evidence of metastatic disease  #4 T7/8 Bone metastases - received Xgeva every 4 weeks from May 2018 to June 2019. 10/16/17 PET/CT revealed improvement with T7 metastatic lesion no longer significantly hypermetabolic.  -on Marchelle Folks  #5 ?liver mets- rpt PET/CT from 10/16/2017 shows no overt evidence of metastatic disease in the liver.  #6 Grade 1 Nausea - improved and intermittent. Hasnt used her anti-emetic as instructed so some nausea and decreased po intake.  #7 Grade 1 Diarrhea - resolved  #8 Hyponatremia -  resolved with sodium at 136 - likely related to some element of adrenal insufficiency, diarrhea, limited by mouth intake. Primarily solute free fluid intake.   #9 Acute on chronic renal insufficiency Creatine stable 1.44  #10 Hyperkalemia due to ACEI + RF- resolved  #11 Hemorrhoids -chronic with some bleeding --Recommended Sitz bath and OTC Anusol or Nupercaine for her hemorrhoid relief. -f/u with PCP for continued mx   #12 Moderate protein calorie malnutrition Weight has stabilized and improved Wt Readings from Last 3 Encounters:  10/12/19 137 lb 8 oz (62.4 kg)  09/16/19 137 lb (62.1 kg)  09/03/19 137 lb 12.8 oz (62.5 kg)   Plan: Continue healthy po intake/diabetic diet -Previously recommended the patient to drink atleast 48-64 oz of fluids daily -f/u with PCP/Cardiology for diuretics management  #13 Hypothyroidism/Adrenal insufficiency/Diabetes -Continue being followed by Dr. Buddy Duty  #14 HTN - ?control. Patient tends to be anxious and has higher blood pressures in the clinic. She can have increased blood pressure from Sutent as well. Plan: -continued close f/u with her PCP /cardiology regarding the many elements necessary for her care that are not directly related to her oncology care. -ACEI held due to AKI and hyperkalemia - following with cardiology to  mx this. Has been started on Amlodipine instead.  #15 Grade 1 mucositis- resolved -Advised the patient to continue use of Magic Mouthwash to aid with nutrition  #16 Newly diagnosed ?CAD - positive cardiac nuclear stress test Following with cardiology and nephrology   #17 h/o Choledocholithiasis and cholelithiasis 07/10/18 US Abdomen revealed Biliary duct dilatation with 8 mm calculus at the level of the ampulla in the distal common bile duct. 2. Cholelithiasis. No gallbladder wall thickening or pericholecystic fluid. 3. Appearance of the liver raises concern for underlying hepatic cirrhosis. No focal liver lesions are demonstrable. 4.  Left kidney absent. 5.  Small cysts in right kidney. 6.  Aortic Atherosclerosis.  S/p ERCP on 07/23/18 with Dr. Valarie Merino Mansouraty  09/02/18 Cytopathology from ERCP was suspicious for malignant cells in bile duct, but was not definitive, other two findings were benign.  PLAN:  -Discussed pt labwork today, 10/12/19; all values are WNL except for Hgb at 11.7, Abs Immature Granulocytes at 0.08K, CO2 at 19, Glucose at 117, Creatinine at 1.69, Calcium at 8.8, Albumin at 3.4, GFR Est Non Af Am at 29. -Discussed 10/12/2019 LDH is WNL -The pt has no prohibitive toxicities from continuing C42D1 of Nivolumab at this time -Will hold Nivolumab on 11/24/19 to reduce the risk of over-inflammation at the site of surgery -No lab or clinical evidence of progression of pt's Left Renal Cell Carcinoma at this time.  -Recommend pt use Tramadol for chronic back pain  -Will see back in  2 months with labs    FOLLOW UP: F/u for next 2 Nivolumab infusions with portflush and labs as scheduled Plz cancel labs/portflush/MD and Nivolumab scheduled for 11/24/2019 and move all those appointments 2 weeks later (patient having surgery)   The total time spent in the appt was 20 minutes and more than 50% was on counseling and direct patient cares.  All of the patient's questions were answered  with apparent satisfaction. The patient knows to call the clinic with any problems, questions or concerns.   Sullivan Lone MD [place] AAHIVMS [facility] Hematology/Oncology Physician [facility]  (Office):       [phone] (Work cell):  [phone] (Fax):           [phone]  I, Yevette Edwards, am acting as a scribe for Dr. Sullivan Lone.   .I have reviewed the above documentation for accuracy and completeness, and I agree with the above. Brunetta Genera MD

## 2019-10-12 ENCOUNTER — Other Ambulatory Visit: Payer: Self-pay

## 2019-10-12 ENCOUNTER — Inpatient Hospital Stay: Payer: Medicare Other

## 2019-10-12 ENCOUNTER — Inpatient Hospital Stay (HOSPITAL_BASED_OUTPATIENT_CLINIC_OR_DEPARTMENT_OTHER): Payer: Medicare Other | Admitting: Hematology

## 2019-10-12 VITALS — BP 147/58 | HR 63 | Temp 97.7°F | Resp 18 | Ht 59.0 in | Wt 137.5 lb

## 2019-10-12 DIAGNOSIS — C642 Malignant neoplasm of left kidney, except renal pelvis: Secondary | ICD-10-CM | POA: Diagnosis not present

## 2019-10-12 DIAGNOSIS — Z5112 Encounter for antineoplastic immunotherapy: Secondary | ICD-10-CM | POA: Diagnosis not present

## 2019-10-12 DIAGNOSIS — C7951 Secondary malignant neoplasm of bone: Secondary | ICD-10-CM

## 2019-10-12 DIAGNOSIS — Z7189 Other specified counseling: Secondary | ICD-10-CM

## 2019-10-12 DIAGNOSIS — Z95828 Presence of other vascular implants and grafts: Secondary | ICD-10-CM

## 2019-10-12 LAB — CBC WITH DIFFERENTIAL/PLATELET
Abs Immature Granulocytes: 0.08 10*3/uL — ABNORMAL HIGH (ref 0.00–0.07)
Basophils Absolute: 0.1 10*3/uL (ref 0.0–0.1)
Basophils Relative: 1 %
Eosinophils Absolute: 0.2 10*3/uL (ref 0.0–0.5)
Eosinophils Relative: 3 %
HCT: 36 % (ref 36.0–46.0)
Hemoglobin: 11.7 g/dL — ABNORMAL LOW (ref 12.0–15.0)
Immature Granulocytes: 1 %
Lymphocytes Relative: 15 %
Lymphs Abs: 1.1 10*3/uL (ref 0.7–4.0)
MCH: 29.5 pg (ref 26.0–34.0)
MCHC: 32.5 g/dL (ref 30.0–36.0)
MCV: 90.9 fL (ref 80.0–100.0)
Monocytes Absolute: 0.8 10*3/uL (ref 0.1–1.0)
Monocytes Relative: 11 %
Neutro Abs: 5.3 10*3/uL (ref 1.7–7.7)
Neutrophils Relative %: 69 %
Platelets: 281 10*3/uL (ref 150–400)
RBC: 3.96 MIL/uL (ref 3.87–5.11)
RDW: 14 % (ref 11.5–15.5)
WBC: 7.6 10*3/uL (ref 4.0–10.5)
nRBC: 0 % (ref 0.0–0.2)

## 2019-10-12 LAB — CMP (CANCER CENTER ONLY)
ALT: 21 U/L (ref 0–44)
AST: 16 U/L (ref 15–41)
Albumin: 3.4 g/dL — ABNORMAL LOW (ref 3.5–5.0)
Alkaline Phosphatase: 60 U/L (ref 38–126)
Anion gap: 10 (ref 5–15)
BUN: 20 mg/dL (ref 8–23)
CO2: 19 mmol/L — ABNORMAL LOW (ref 22–32)
Calcium: 8.8 mg/dL — ABNORMAL LOW (ref 8.9–10.3)
Chloride: 110 mmol/L (ref 98–111)
Creatinine: 1.69 mg/dL — ABNORMAL HIGH (ref 0.44–1.00)
GFR, Est AFR Am: 33 mL/min — ABNORMAL LOW (ref >60)
GFR, Estimated: 29 mL/min — ABNORMAL LOW (ref >60)
Glucose, Bld: 117 mg/dL — ABNORMAL HIGH (ref 70–99)
Potassium: 4.4 mmol/L (ref 3.5–5.1)
Sodium: 139 mmol/L (ref 135–145)
Total Bilirubin: 0.4 mg/dL (ref 0.3–1.2)
Total Protein: 6.5 g/dL (ref 6.5–8.1)

## 2019-10-12 LAB — LACTATE DEHYDROGENASE: LDH: 143 U/L (ref 98–192)

## 2019-10-12 MED ORDER — SODIUM CHLORIDE 0.9 % IV SOLN
Freq: Once | INTRAVENOUS | Status: AC
  Filled 2019-10-12: qty 250

## 2019-10-12 MED ORDER — SODIUM CHLORIDE 0.9% FLUSH
10.0000 mL | INTRAVENOUS | Status: DC | PRN
  Administered 2019-10-12: 10 mL
  Filled 2019-10-12: qty 10

## 2019-10-12 MED ORDER — SODIUM CHLORIDE 0.9 % IV SOLN
240.0000 mg | Freq: Once | INTRAVENOUS | Status: AC
  Administered 2019-10-12: 240 mg via INTRAVENOUS
  Filled 2019-10-12: qty 24

## 2019-10-12 MED ORDER — HEPARIN SOD (PORK) LOCK FLUSH 100 UNIT/ML IV SOLN
500.0000 [IU] | Freq: Once | INTRAVENOUS | Status: AC | PRN
  Administered 2019-10-12: 500 [IU]
  Filled 2019-10-12: qty 5

## 2019-10-12 MED ORDER — SODIUM CHLORIDE 0.9% FLUSH
10.0000 mL | Freq: Once | INTRAVENOUS | Status: AC
  Administered 2019-10-12: 10 mL
  Filled 2019-10-12: qty 10

## 2019-10-12 NOTE — Patient Instructions (Signed)
[facility] Discharge Instructions for Patients Receiving Chemotherapy  Today you received the following chemotherapy agents: Nivolumab  To help prevent nausea and vomiting after your treatment, we encourage you to take your nausea medication as directed.    If you develop nausea and vomiting that is not controlled by your nausea medication, call the clinic.   BELOW ARE SYMPTOMS THAT SHOULD BE REPORTED IMMEDIATELY:  *FEVER GREATER THAN 100.5 F  *CHILLS WITH OR WITHOUT FEVER  NAUSEA AND VOMITING THAT IS NOT CONTROLLED WITH YOUR NAUSEA MEDICATION  *UNUSUAL SHORTNESS OF BREATH  *UNUSUAL BRUISING OR BLEEDING  TENDERNESS IN MOUTH AND THROAT WITH OR WITHOUT PRESENCE OF ULCERS  *URINARY PROBLEMS  *BOWEL PROBLEMS  UNUSUAL RASH Items with * indicate a potential emergency and should be followed up as soon as possible.  Feel free to call the clinic should you have any questions or concerns. The clinic phone number is [phone].  Please show the CHEMO ALERT CARD at check-in to the Emergency Department and triage nurse.   

## 2019-10-15 ENCOUNTER — Other Ambulatory Visit: Payer: Medicare Other

## 2019-10-15 ENCOUNTER — Ambulatory Visit: Payer: Medicare Other

## 2019-10-15 ENCOUNTER — Telehealth: Payer: Self-pay | Admitting: Hematology

## 2019-10-15 ENCOUNTER — Ambulatory Visit: Payer: Medicare Other | Admitting: Hematology

## 2019-10-15 NOTE — Telephone Encounter (Signed)
Scheduled per los, patient has been called and notified. 

## 2019-10-18 ENCOUNTER — Other Ambulatory Visit: Payer: Self-pay

## 2019-10-18 ENCOUNTER — Telehealth: Payer: Self-pay

## 2019-10-18 DIAGNOSIS — Z9889 Other specified postprocedural states: Secondary | ICD-10-CM

## 2019-10-18 NOTE — Telephone Encounter (Signed)
Dr Henrene Pastor can you please review? Dr Rush Landmark is out of the office for a while.  Thank you

## 2019-10-18 NOTE — Telephone Encounter (Signed)
Labs order entered and appt made for 7/1 at 230 pm with Audrey [name].  The pt will come in tomorrow morning for labs.  She has been advised to go to the ED for eval if pain worsens or she develops fever.  The pt has been advised of the information and verbalized understanding.      Audrey Shipper, MD  Timothy Lasso, RN Cc: Mansouraty, Telford Nab., MD Chart reviewed including ERCP last August and office visit this past March. Patient with indeterminant biliary stricture and indwelling self-expanding metal stent. Comprehensive metabolic panel recently with normal liver tests. Recommendations: 1. Stat liver tests and CBC 2. Office evaluation with advanced practitioner ASAP 3. Should her pain worsen or she develop fevers, go to the ER.  Thanks, Dr. Henrene Pastor

## 2019-10-19 ENCOUNTER — Other Ambulatory Visit (INDEPENDENT_AMBULATORY_CARE_PROVIDER_SITE_OTHER): Payer: Medicare Other

## 2019-10-19 DIAGNOSIS — Z9889 Other specified postprocedural states: Secondary | ICD-10-CM

## 2019-10-19 LAB — CBC WITH DIFFERENTIAL/PLATELET
Basophils Absolute: 0.1 10*3/uL (ref 0.0–0.1)
Basophils Relative: 0.9 % (ref 0.0–3.0)
Eosinophils Absolute: 0.2 10*3/uL (ref 0.0–0.7)
Eosinophils Relative: 2 % (ref 0.0–5.0)
HCT: 38.5 % (ref 36.0–46.0)
Hemoglobin: 12.8 g/dL (ref 12.0–15.0)
Lymphocytes Relative: 11.6 % — ABNORMAL LOW (ref 12.0–46.0)
Lymphs Abs: 1 10*3/uL (ref 0.7–4.0)
MCHC: 33.2 g/dL (ref 30.0–36.0)
MCV: 88.2 fl (ref 78.0–100.0)
Monocytes Absolute: 0.9 10*3/uL (ref 0.1–1.0)
Monocytes Relative: 10.8 % (ref 3.0–12.0)
Neutro Abs: 6.3 10*3/uL (ref 1.4–7.7)
Neutrophils Relative %: 74.7 % (ref 43.0–77.0)
Platelets: 257 10*3/uL (ref 150.0–400.0)
RBC: 4.36 Mil/uL (ref 3.87–5.11)
RDW: 14.3 % (ref 11.5–15.5)
WBC: 8.5 10*3/uL (ref 4.0–10.5)

## 2019-10-19 LAB — HEPATIC FUNCTION PANEL
ALT: 95 U/L — ABNORMAL HIGH (ref 0–35)
AST: 36 U/L (ref 0–37)
Albumin: 3.9 g/dL (ref 3.5–5.2)
Alkaline Phosphatase: 60 U/L (ref 39–117)
Bilirubin, Direct: 0 mg/dL (ref 0.0–0.3)
Total Bilirubin: 0.6 mg/dL (ref 0.2–1.2)
Total Protein: 6.7 g/dL (ref 6.0–8.3)

## 2019-10-21 ENCOUNTER — Ambulatory Visit: Payer: Medicare Other | Admitting: Physician Assistant

## 2019-10-27 ENCOUNTER — Inpatient Hospital Stay: Payer: Medicare Other

## 2019-10-27 ENCOUNTER — Other Ambulatory Visit: Payer: Medicare Other

## 2019-10-27 ENCOUNTER — Inpatient Hospital Stay: Payer: Medicare Other | Attending: Hematology

## 2019-10-27 ENCOUNTER — Other Ambulatory Visit: Payer: Self-pay

## 2019-10-27 ENCOUNTER — Ambulatory Visit: Payer: Medicare Other

## 2019-10-27 VITALS — BP 148/66 | HR 66 | Temp 98.0°F | Resp 18 | Wt 137.5 lb

## 2019-10-27 DIAGNOSIS — Z5112 Encounter for antineoplastic immunotherapy: Secondary | ICD-10-CM | POA: Diagnosis present

## 2019-10-27 DIAGNOSIS — Z7189 Other specified counseling: Secondary | ICD-10-CM

## 2019-10-27 DIAGNOSIS — C7951 Secondary malignant neoplasm of bone: Secondary | ICD-10-CM

## 2019-10-27 DIAGNOSIS — C642 Malignant neoplasm of left kidney, except renal pelvis: Secondary | ICD-10-CM | POA: Insufficient documentation

## 2019-10-27 DIAGNOSIS — Z95828 Presence of other vascular implants and grafts: Secondary | ICD-10-CM

## 2019-10-27 LAB — CMP (CANCER CENTER ONLY)
ALT: 29 U/L (ref 0–44)
AST: 19 U/L (ref 15–41)
Albumin: 3.4 g/dL — ABNORMAL LOW (ref 3.5–5.0)
Alkaline Phosphatase: 63 U/L (ref 38–126)
Anion gap: 12 (ref 5–15)
BUN: 21 mg/dL (ref 8–23)
CO2: 20 mmol/L — ABNORMAL LOW (ref 22–32)
Calcium: 8.9 mg/dL (ref 8.9–10.3)
Chloride: 104 mmol/L (ref 98–111)
Creatinine: 1.72 mg/dL — ABNORMAL HIGH (ref 0.44–1.00)
GFR, Est AFR Am: 33 mL/min — ABNORMAL LOW (ref >60)
GFR, Estimated: 28 mL/min — ABNORMAL LOW (ref >60)
Glucose, Bld: 197 mg/dL — ABNORMAL HIGH (ref 70–99)
Potassium: 4.1 mmol/L (ref 3.5–5.1)
Sodium: 136 mmol/L (ref 135–145)
Total Bilirubin: 0.4 mg/dL (ref 0.3–1.2)
Total Protein: 6.7 g/dL (ref 6.5–8.1)

## 2019-10-27 LAB — CBC WITH DIFFERENTIAL/PLATELET
Abs Immature Granulocytes: 0.17 10*3/uL — ABNORMAL HIGH (ref 0.00–0.07)
Basophils Absolute: 0.1 10*3/uL (ref 0.0–0.1)
Basophils Relative: 1 %
Eosinophils Absolute: 0.2 10*3/uL (ref 0.0–0.5)
Eosinophils Relative: 2 %
HCT: 37 % (ref 36.0–46.0)
Hemoglobin: 12.1 g/dL (ref 12.0–15.0)
Immature Granulocytes: 2 %
Lymphocytes Relative: 13 %
Lymphs Abs: 1.1 10*3/uL (ref 0.7–4.0)
MCH: 29.4 pg (ref 26.0–34.0)
MCHC: 32.7 g/dL (ref 30.0–36.0)
MCV: 90 fL (ref 80.0–100.0)
Monocytes Absolute: 0.9 10*3/uL (ref 0.1–1.0)
Monocytes Relative: 12 %
Neutro Abs: 5.7 10*3/uL (ref 1.7–7.7)
Neutrophils Relative %: 70 %
Platelets: 287 10*3/uL (ref 150–400)
RBC: 4.11 MIL/uL (ref 3.87–5.11)
RDW: 13.3 % (ref 11.5–15.5)
WBC: 8.2 10*3/uL (ref 4.0–10.5)
nRBC: 0 % (ref 0.0–0.2)

## 2019-10-27 LAB — LACTATE DEHYDROGENASE: LDH: 126 U/L (ref 98–192)

## 2019-10-27 MED ORDER — SODIUM CHLORIDE 0.9% FLUSH
10.0000 mL | Freq: Once | INTRAVENOUS | Status: AC
  Administered 2019-10-27: 10 mL
  Filled 2019-10-27: qty 10

## 2019-10-27 MED ORDER — SODIUM CHLORIDE 0.9% FLUSH
10.0000 mL | INTRAVENOUS | Status: DC | PRN
  Administered 2019-10-27: 10 mL
  Filled 2019-10-27: qty 10

## 2019-10-27 MED ORDER — SODIUM CHLORIDE 0.9 % IV SOLN
Freq: Once | INTRAVENOUS | Status: AC
  Filled 2019-10-27: qty 250

## 2019-10-27 MED ORDER — SODIUM CHLORIDE 0.9 % IV SOLN
240.0000 mg | Freq: Once | INTRAVENOUS | Status: AC
  Administered 2019-10-27: 240 mg via INTRAVENOUS
  Filled 2019-10-27: qty 24

## 2019-10-27 MED ORDER — HEPARIN SOD (PORK) LOCK FLUSH 100 UNIT/ML IV SOLN
500.0000 [IU] | Freq: Once | INTRAVENOUS | Status: AC | PRN
  Administered 2019-10-27: 500 [IU]
  Filled 2019-10-27: qty 5

## 2019-10-27 MED ORDER — DENOSUMAB 120 MG/1.7ML ~~LOC~~ SOLN
120.0000 mg | Freq: Once | SUBCUTANEOUS | Status: AC
  Administered 2019-10-27: 120 mg via SUBCUTANEOUS

## 2019-10-27 MED ORDER — DENOSUMAB 120 MG/1.7ML ~~LOC~~ SOLN
SUBCUTANEOUS | Status: AC
  Filled 2019-10-27: qty 1.7

## 2019-10-27 NOTE — Patient Instructions (Signed)

## 2019-10-27 NOTE — Patient Instructions (Signed)
[facility] Discharge Instructions for Patients Receiving Chemotherapy  Today you received the following chemotherapy agent: Nivolumab  To help prevent nausea and vomiting after your treatment, we encourage you to take your nausea medication as directed   If you develop nausea and vomiting that is not controlled by your nausea medication, call the clinic.   BELOW ARE SYMPTOMS THAT SHOULD BE REPORTED IMMEDIATELY:  *FEVER GREATER THAN 100.5 F  *CHILLS WITH OR WITHOUT FEVER  NAUSEA AND VOMITING THAT IS NOT CONTROLLED WITH YOUR NAUSEA MEDICATION  *UNUSUAL SHORTNESS OF BREATH  *UNUSUAL BRUISING OR BLEEDING  TENDERNESS IN MOUTH AND THROAT WITH OR WITHOUT PRESENCE OF ULCERS  *URINARY PROBLEMS  *BOWEL PROBLEMS  UNUSUAL RASH Items with * indicate a potential emergency and should be followed up as soon as possible.  Feel free to call the clinic should you have any questions or concerns. The clinic phone number is [phone].  Please show the CHEMO ALERT CARD at check-in to the Emergency Department and triage nurse.   

## 2019-10-27 NOTE — Progress Notes (Signed)
Per [name], okay for patient to receive nivolumab and xgeva today with creatine 1.72

## 2019-10-29 ENCOUNTER — Other Ambulatory Visit: Payer: Medicare Other

## 2019-10-29 ENCOUNTER — Ambulatory Visit: Payer: Medicare Other

## 2019-11-10 ENCOUNTER — Inpatient Hospital Stay: Payer: Medicare Other

## 2019-11-10 ENCOUNTER — Other Ambulatory Visit: Payer: Self-pay

## 2019-11-10 ENCOUNTER — Telehealth: Payer: Self-pay | Admitting: Gastroenterology

## 2019-11-10 VITALS — BP 149/71 | HR 66 | Temp 97.9°F | Resp 18

## 2019-11-10 DIAGNOSIS — C642 Malignant neoplasm of left kidney, except renal pelvis: Secondary | ICD-10-CM

## 2019-11-10 DIAGNOSIS — Z5112 Encounter for antineoplastic immunotherapy: Secondary | ICD-10-CM | POA: Diagnosis not present

## 2019-11-10 DIAGNOSIS — C7951 Secondary malignant neoplasm of bone: Secondary | ICD-10-CM

## 2019-11-10 DIAGNOSIS — Z95828 Presence of other vascular implants and grafts: Secondary | ICD-10-CM

## 2019-11-10 DIAGNOSIS — N8110 Cystocele, unspecified: Secondary | ICD-10-CM | POA: Insufficient documentation

## 2019-11-10 DIAGNOSIS — K439 Ventral hernia without obstruction or gangrene: Secondary | ICD-10-CM | POA: Insufficient documentation

## 2019-11-10 DIAGNOSIS — Z7189 Other specified counseling: Secondary | ICD-10-CM

## 2019-11-10 LAB — LACTATE DEHYDROGENASE: LDH: 134 U/L (ref 98–192)

## 2019-11-10 LAB — CMP (CANCER CENTER ONLY)
ALT: 22 U/L (ref 0–44)
AST: 19 U/L (ref 15–41)
Albumin: 3.4 g/dL — ABNORMAL LOW (ref 3.5–5.0)
Alkaline Phosphatase: 70 U/L (ref 38–126)
Anion gap: 9 (ref 5–15)
BUN: 21 mg/dL (ref 8–23)
CO2: 21 mmol/L — ABNORMAL LOW (ref 22–32)
Calcium: 8.8 mg/dL — ABNORMAL LOW (ref 8.9–10.3)
Chloride: 107 mmol/L (ref 98–111)
Creatinine: 1.5 mg/dL — ABNORMAL HIGH (ref 0.44–1.00)
GFR, Est AFR Am: 39 mL/min — ABNORMAL LOW (ref >60)
GFR, Estimated: 33 mL/min — ABNORMAL LOW (ref >60)
Glucose, Bld: 185 mg/dL — ABNORMAL HIGH (ref 70–99)
Potassium: 4.5 mmol/L (ref 3.5–5.1)
Sodium: 137 mmol/L (ref 135–145)
Total Bilirubin: 0.5 mg/dL (ref 0.3–1.2)
Total Protein: 6.7 g/dL (ref 6.5–8.1)

## 2019-11-10 LAB — CBC WITH DIFFERENTIAL/PLATELET
Abs Immature Granulocytes: 0.08 10*3/uL — ABNORMAL HIGH (ref 0.00–0.07)
Basophils Absolute: 0.1 10*3/uL (ref 0.0–0.1)
Basophils Relative: 1 %
Eosinophils Absolute: 0.2 10*3/uL (ref 0.0–0.5)
Eosinophils Relative: 2 %
HCT: 37.1 % (ref 36.0–46.0)
Hemoglobin: 11.8 g/dL — ABNORMAL LOW (ref 12.0–15.0)
Immature Granulocytes: 1 %
Lymphocytes Relative: 15 %
Lymphs Abs: 1.2 10*3/uL (ref 0.7–4.0)
MCH: 28.9 pg (ref 26.0–34.0)
MCHC: 31.8 g/dL (ref 30.0–36.0)
MCV: 90.7 fL (ref 80.0–100.0)
Monocytes Absolute: 1 10*3/uL (ref 0.1–1.0)
Monocytes Relative: 13 %
Neutro Abs: 5.7 10*3/uL (ref 1.7–7.7)
Neutrophils Relative %: 68 %
Platelets: 247 10*3/uL (ref 150–400)
RBC: 4.09 MIL/uL (ref 3.87–5.11)
RDW: 13.7 % (ref 11.5–15.5)
WBC: 8.2 10*3/uL (ref 4.0–10.5)
nRBC: 0 % (ref 0.0–0.2)

## 2019-11-10 MED ORDER — SODIUM CHLORIDE 0.9% FLUSH
10.0000 mL | Freq: Once | INTRAVENOUS | Status: AC
  Administered 2019-11-10: 10 mL
  Filled 2019-11-10: qty 10

## 2019-11-10 MED ORDER — SODIUM CHLORIDE 0.9% FLUSH
10.0000 mL | INTRAVENOUS | Status: DC | PRN
  Administered 2019-11-10: 10 mL
  Filled 2019-11-10: qty 10

## 2019-11-10 MED ORDER — SODIUM CHLORIDE 0.9 % IV SOLN
240.0000 mg | Freq: Once | INTRAVENOUS | Status: AC
  Administered 2019-11-10: 240 mg via INTRAVENOUS
  Filled 2019-11-10: qty 24

## 2019-11-10 MED ORDER — HEPARIN SOD (PORK) LOCK FLUSH 100 UNIT/ML IV SOLN
500.0000 [IU] | Freq: Once | INTRAVENOUS | Status: AC | PRN
  Administered 2019-11-10: 500 [IU]
  Filled 2019-11-10: qty 5

## 2019-11-10 MED ORDER — SODIUM CHLORIDE 0.9 % IV SOLN
Freq: Once | INTRAVENOUS | Status: AC
  Filled 2019-11-10: qty 250

## 2019-11-10 NOTE — Progress Notes (Signed)
Pt stated ok to use regular dressing

## 2019-11-10 NOTE — Patient Instructions (Signed)
[facility] Discharge Instructions for Patients Receiving Chemotherapy  Today you received the following chemotherapy agent: Nivolumab  To help prevent nausea and vomiting after your treatment, we encourage you to take your nausea medication as directed   If you develop nausea and vomiting that is not controlled by your nausea medication, call the clinic.   BELOW ARE SYMPTOMS THAT SHOULD BE REPORTED IMMEDIATELY:  *FEVER GREATER THAN 100.5 F  *CHILLS WITH OR WITHOUT FEVER  NAUSEA AND VOMITING THAT IS NOT CONTROLLED WITH YOUR NAUSEA MEDICATION  *UNUSUAL SHORTNESS OF BREATH  *UNUSUAL BRUISING OR BLEEDING  TENDERNESS IN MOUTH AND THROAT WITH OR WITHOUT PRESENCE OF ULCERS  *URINARY PROBLEMS  *BOWEL PROBLEMS  UNUSUAL RASH Items with * indicate a potential emergency and should be followed up as soon as possible.  Feel free to call the clinic should you have any questions or concerns. The clinic phone number is [phone].  Please show the CHEMO ALERT CARD at check-in to the Emergency Department and triage nurse.   

## 2019-11-10 NOTE — Telephone Encounter (Signed)
The pt has been advised that the location of the COVID test will be changing on 8/1.  We discussed the change and all questions answered.

## 2019-11-12 ENCOUNTER — Other Ambulatory Visit: Payer: Medicare Other

## 2019-11-12 ENCOUNTER — Ambulatory Visit: Payer: Medicare Other

## 2019-11-17 ENCOUNTER — Encounter (HOSPITAL_COMMUNITY): Payer: Self-pay

## 2019-11-17 NOTE — Progress Notes (Signed)
Sent a message in mychart regardig the covid drive-thru location change.

## 2019-11-18 ENCOUNTER — Telehealth: Payer: Self-pay | Admitting: Gastroenterology

## 2019-11-18 HISTORY — PX: VAGINAL PROLAPSE REPAIR: SHX830

## 2019-11-18 NOTE — Telephone Encounter (Signed)
That is OK with me. Please place a recall for 76-months from now so that she can still be in our [place]. Thanks. GM

## 2019-11-18 NOTE — Telephone Encounter (Signed)
The pt called to cancel her ERCP due to recent surgery.  She states she just got out of the hospital for "female surgery" she says she will call back when she feels she is able to reschedule.

## 2019-11-24 ENCOUNTER — Other Ambulatory Visit: Payer: Medicare Other

## 2019-11-24 ENCOUNTER — Ambulatory Visit: Payer: Medicare Other

## 2019-11-24 ENCOUNTER — Ambulatory Visit: Payer: Medicare Other | Admitting: Hematology

## 2019-11-25 ENCOUNTER — Inpatient Hospital Stay (HOSPITAL_COMMUNITY)
Admission: RE | Admit: 2019-11-25 | Discharge: 2019-11-25 | Disposition: A | Discharge status: Home / Self Care | Payer: Medicare Other | Source: Ambulatory Visit

## 2019-11-25 ENCOUNTER — Telehealth: Payer: Self-pay | Admitting: Gastroenterology

## 2019-11-25 NOTE — Telephone Encounter (Signed)
Pt is requesting a call back from a nurse to schedule her for an ERCP

## 2019-11-26 ENCOUNTER — Ambulatory Visit: Payer: Medicare Other

## 2019-11-26 ENCOUNTER — Other Ambulatory Visit: Payer: Medicare Other

## 2019-11-26 ENCOUNTER — Ambulatory Visit: Payer: Medicare Other | Admitting: Hematology

## 2019-11-26 ENCOUNTER — Other Ambulatory Visit: Payer: Self-pay

## 2019-11-26 DIAGNOSIS — Z9889 Other specified postprocedural states: Secondary | ICD-10-CM

## 2019-11-26 DIAGNOSIS — K811 Chronic cholecystitis: Secondary | ICD-10-CM

## 2019-11-26 DIAGNOSIS — K831 Obstruction of bile duct: Secondary | ICD-10-CM

## 2019-11-26 DIAGNOSIS — R945 Abnormal results of liver function studies: Secondary | ICD-10-CM

## 2019-11-26 DIAGNOSIS — R7989 Other specified abnormal findings of blood chemistry: Secondary | ICD-10-CM

## 2019-11-26 NOTE — Telephone Encounter (Signed)
ERCP scheduled, pt instructed and medications reviewed.  Patient instructions sent to My chart and the pt did verbalize understanding of the information.  Patient to call with any questions or concerns.

## 2019-11-29 ENCOUNTER — Encounter (HOSPITAL_COMMUNITY): Payer: Self-pay

## 2019-11-29 ENCOUNTER — Ambulatory Visit (HOSPITAL_COMMUNITY): Admit: 2019-11-29 | Payer: Medicare Other | Admitting: Gastroenterology

## 2019-11-29 SURGERY — ENDOSCOPIC RETROGRADE CHOLANGIOPANCREATOGRAPHY (ERCP) WITH PROPOFOL
Anesthesia: General

## 2019-12-08 ENCOUNTER — Inpatient Hospital Stay (HOSPITAL_BASED_OUTPATIENT_CLINIC_OR_DEPARTMENT_OTHER): Payer: Medicare Other | Admitting: Hematology

## 2019-12-08 ENCOUNTER — Other Ambulatory Visit: Payer: Self-pay

## 2019-12-08 ENCOUNTER — Inpatient Hospital Stay: Payer: Medicare Other

## 2019-12-08 ENCOUNTER — Inpatient Hospital Stay: Payer: Medicare Other | Attending: Hematology

## 2019-12-08 VITALS — BP 145/70 | HR 86 | Temp 97.6°F | Resp 18 | Ht 59.0 in | Wt 132.6 lb

## 2019-12-08 DIAGNOSIS — I2510 Atherosclerotic heart disease of native coronary artery without angina pectoris: Secondary | ICD-10-CM | POA: Diagnosis not present

## 2019-12-08 DIAGNOSIS — R110 Nausea: Secondary | ICD-10-CM | POA: Insufficient documentation

## 2019-12-08 DIAGNOSIS — Z95828 Presence of other vascular implants and grafts: Secondary | ICD-10-CM

## 2019-12-08 DIAGNOSIS — E785 Hyperlipidemia, unspecified: Secondary | ICD-10-CM | POA: Insufficient documentation

## 2019-12-08 DIAGNOSIS — K449 Diaphragmatic hernia without obstruction or gangrene: Secondary | ICD-10-CM | POA: Diagnosis not present

## 2019-12-08 DIAGNOSIS — E119 Type 2 diabetes mellitus without complications: Secondary | ICD-10-CM | POA: Insufficient documentation

## 2019-12-08 DIAGNOSIS — C642 Malignant neoplasm of left kidney, except renal pelvis: Secondary | ICD-10-CM

## 2019-12-08 DIAGNOSIS — Z7189 Other specified counseling: Secondary | ICD-10-CM

## 2019-12-08 DIAGNOSIS — I340 Nonrheumatic mitral (valve) insufficiency: Secondary | ICD-10-CM | POA: Insufficient documentation

## 2019-12-08 DIAGNOSIS — D649 Anemia, unspecified: Secondary | ICD-10-CM | POA: Diagnosis not present

## 2019-12-08 DIAGNOSIS — I10 Essential (primary) hypertension: Secondary | ICD-10-CM | POA: Insufficient documentation

## 2019-12-08 DIAGNOSIS — C7800 Secondary malignant neoplasm of unspecified lung: Secondary | ICD-10-CM | POA: Diagnosis not present

## 2019-12-08 DIAGNOSIS — Z5112 Encounter for antineoplastic immunotherapy: Secondary | ICD-10-CM

## 2019-12-08 DIAGNOSIS — E871 Hypo-osmolality and hyponatremia: Secondary | ICD-10-CM | POA: Insufficient documentation

## 2019-12-08 DIAGNOSIS — C7951 Secondary malignant neoplasm of bone: Secondary | ICD-10-CM

## 2019-12-08 DIAGNOSIS — M1990 Unspecified osteoarthritis, unspecified site: Secondary | ICD-10-CM | POA: Diagnosis not present

## 2019-12-08 DIAGNOSIS — E860 Dehydration: Secondary | ICD-10-CM | POA: Diagnosis not present

## 2019-12-08 DIAGNOSIS — R748 Abnormal levels of other serum enzymes: Secondary | ICD-10-CM | POA: Diagnosis not present

## 2019-12-08 DIAGNOSIS — K219 Gastro-esophageal reflux disease without esophagitis: Secondary | ICD-10-CM | POA: Insufficient documentation

## 2019-12-08 DIAGNOSIS — E039 Hypothyroidism, unspecified: Secondary | ICD-10-CM | POA: Insufficient documentation

## 2019-12-08 DIAGNOSIS — E538 Deficiency of other specified B group vitamins: Secondary | ICD-10-CM | POA: Insufficient documentation

## 2019-12-08 DIAGNOSIS — I252 Old myocardial infarction: Secondary | ICD-10-CM | POA: Insufficient documentation

## 2019-12-08 LAB — CBC WITH DIFFERENTIAL/PLATELET
Abs Immature Granulocytes: 0.14 10*3/uL — ABNORMAL HIGH (ref 0.00–0.07)
Basophils Absolute: 0.1 10*3/uL (ref 0.0–0.1)
Basophils Relative: 1 %
Eosinophils Absolute: 0.3 10*3/uL (ref 0.0–0.5)
Eosinophils Relative: 3 %
HCT: 37.7 % (ref 36.0–46.0)
Hemoglobin: 11.9 g/dL — ABNORMAL LOW (ref 12.0–15.0)
Immature Granulocytes: 1 %
Lymphocytes Relative: 19 %
Lymphs Abs: 1.9 10*3/uL (ref 0.7–4.0)
MCH: 28.1 pg (ref 26.0–34.0)
MCHC: 31.6 g/dL (ref 30.0–36.0)
MCV: 89.1 fL (ref 80.0–100.0)
Monocytes Absolute: 1.1 10*3/uL — ABNORMAL HIGH (ref 0.1–1.0)
Monocytes Relative: 11 %
Neutro Abs: 6.2 10*3/uL (ref 1.7–7.7)
Neutrophils Relative %: 65 %
Platelets: 304 10*3/uL (ref 150–400)
RBC: 4.23 MIL/uL (ref 3.87–5.11)
RDW: 14 % (ref 11.5–15.5)
WBC: 9.7 10*3/uL (ref 4.0–10.5)
nRBC: 0 % (ref 0.0–0.2)

## 2019-12-08 LAB — CMP (CANCER CENTER ONLY)
ALT: 11 U/L (ref 0–44)
AST: 14 U/L — ABNORMAL LOW (ref 15–41)
Albumin: 3.5 g/dL (ref 3.5–5.0)
Alkaline Phosphatase: 68 U/L (ref 38–126)
Anion gap: 10 (ref 5–15)
BUN: 40 mg/dL — ABNORMAL HIGH (ref 8–23)
CO2: 20 mmol/L — ABNORMAL LOW (ref 22–32)
Calcium: 10.4 mg/dL — ABNORMAL HIGH (ref 8.9–10.3)
Chloride: 105 mmol/L (ref 98–111)
Creatinine: 1.87 mg/dL — ABNORMAL HIGH (ref 0.44–1.00)
GFR, Est AFR Am: 30 mL/min — ABNORMAL LOW
GFR, Estimated: 25 mL/min — ABNORMAL LOW
Glucose, Bld: 146 mg/dL — ABNORMAL HIGH (ref 70–99)
Potassium: 4.4 mmol/L (ref 3.5–5.1)
Sodium: 135 mmol/L (ref 135–145)
Total Bilirubin: 0.6 mg/dL (ref 0.3–1.2)
Total Protein: 6.9 g/dL (ref 6.5–8.1)

## 2019-12-08 LAB — LACTATE DEHYDROGENASE: LDH: 115 U/L (ref 98–192)

## 2019-12-08 MED ORDER — SODIUM CHLORIDE 0.9% FLUSH
10.0000 mL | INTRAVENOUS | Status: DC | PRN
  Administered 2019-12-08: 10 mL
  Filled 2019-12-08: qty 10

## 2019-12-08 MED ORDER — DENOSUMAB 120 MG/1.7ML ~~LOC~~ SOLN
SUBCUTANEOUS | Status: AC
  Filled 2019-12-08: qty 1.7

## 2019-12-08 MED ORDER — HEPARIN SOD (PORK) LOCK FLUSH 100 UNIT/ML IV SOLN
500.0000 [IU] | Freq: Once | INTRAVENOUS | Status: AC | PRN
  Administered 2019-12-08: 500 [IU]
  Filled 2019-12-08: qty 5

## 2019-12-08 MED ORDER — DENOSUMAB 120 MG/1.7ML ~~LOC~~ SOLN
120.0000 mg | Freq: Once | SUBCUTANEOUS | Status: AC
  Administered 2019-12-08: 120 mg via SUBCUTANEOUS

## 2019-12-08 MED ORDER — SODIUM CHLORIDE 0.9 % IV SOLN
240.0000 mg | Freq: Once | INTRAVENOUS | Status: AC
  Administered 2019-12-08: 240 mg via INTRAVENOUS
  Filled 2019-12-08: qty 24

## 2019-12-08 MED ORDER — SODIUM CHLORIDE 0.9 % IV SOLN
Freq: Once | INTRAVENOUS | Status: AC
  Filled 2019-12-08: qty 250

## 2019-12-08 MED ORDER — SODIUM CHLORIDE 0.9% FLUSH
10.0000 mL | Freq: Once | INTRAVENOUS | Status: AC
  Administered 2019-12-08: 10 mL
  Filled 2019-12-08: qty 10

## 2019-12-08 NOTE — Patient Instructions (Signed)

## 2019-12-08 NOTE — Patient Instructions (Signed)
[facility] Discharge Instructions for Patients Receiving Chemotherapy  Today you received the following chemotherapy agent: Nivolumab  To help prevent nausea and vomiting after your treatment, we encourage you to take your nausea medication as directed   If you develop nausea and vomiting that is not controlled by your nausea medication, call the clinic.   BELOW ARE SYMPTOMS THAT SHOULD BE REPORTED IMMEDIATELY:  *FEVER GREATER THAN 100.5 F  *CHILLS WITH OR WITHOUT FEVER  NAUSEA AND VOMITING THAT IS NOT CONTROLLED WITH YOUR NAUSEA MEDICATION  *UNUSUAL SHORTNESS OF BREATH  *UNUSUAL BRUISING OR BLEEDING  TENDERNESS IN MOUTH AND THROAT WITH OR WITHOUT PRESENCE OF ULCERS  *URINARY PROBLEMS  *BOWEL PROBLEMS  UNUSUAL RASH Items with * indicate a potential emergency and should be followed up as soon as possible.  Feel free to call the clinic should you have any questions or concerns. The clinic phone number is [phone].  Please show the CHEMO ALERT CARD at check-in to the Emergency Department and triage nurse.   

## 2019-12-08 NOTE — Progress Notes (Signed)
Ok to treat per MD Irene Limbo

## 2019-12-08 NOTE — Progress Notes (Signed)
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 She is here for C45D1 of Nivolumab. The patient's last visit with [name] was on 10/12/2019. The pt reports that she is doing well overall.  The pt reports that she had her Colpopexy three weeks ago and is recovering well. She is experiencing some bleeding. Pt is scheduled for ERCP with Dr. Rush Landmark in early October.   Lab results today (12/08/19) of CBC w/diff and CMP is as follows: all values are WNL except for Hgb at 11.9, Mono Abs at 1.1K, Abs Immature Granulocytes at 0.14K, CO2 at 20, Glucose at 146, BUN at 40, Creatinine at 1.87, Calcium at 10.4, AST at 14, GFR Est Non Af Am at 25. 12/08/2019 LDH at 115  On review of systems, pt reports vaginal bleeding and denies diarrhea, constipation, fevers, chills, night sweats, leg swelling and any other symptoms.   MEDICAL HISTORY:   Past Medical History:  Diagnosis Date  . Anemia   . Arthritis    lower back, hips, hands  . Biliary stricture   . Diabetes mellitus ([place])    type 2 - no meds, diet controlled  . Early cataracts, bilateral   . Elevated liver enzymes   . GERD (gastroesophageal reflux disease)    occasional - diet controlled  . History of hiatal hernia   . HTN (hypertension)   . Hyperlipidemia   . Hypothyroidism   . left renal ca dx'd 2018   renal cancer - left kidney removed, pill chemo x 1 yr  . Myocardial infarction ([place]) 1991   no deficits  . SVD (spontaneous vaginal delivery)    x 3  . Wears glasses    Dyslipidemia  Osteoarthritis Ex-smoker Coronary artery disease Thyroid disorder-was apparently on levothyroxine 25 g daily which has subsequently been discontinued . Mitral regurgitation B12 deficiency  hiatal hernia with esophagitis   SURGICAL HISTORY: Past Surgical History:  Procedure Laterality Date  . BALLOON DILATION N/A 07/23/2018   Procedure: BALLOON DILATION;  Surgeon: Rush Landmark Telford Nab., MD;  Location: [place];  Service: Gastroenterology;  Laterality: N/A;  . BILIARY BRUSHING  08/10/2018   Procedure: BILIARY BRUSHING;  Surgeon: Rush Landmark Telford Nab., MD;  Location: [place];  Service: Gastroenterology;;  . BILIARY BRUSHING  11/30/2018   Procedure: BILIARY BRUSHING;  Surgeon: Irving Copas., MD;  Location: [place];  Service: Gastroenterology;;  . BILIARY DILATION  08/10/2018   Procedure: BILIARY DILATION;  Surgeon: Irving Copas., MD;  Location: [place];  Service: Gastroenterology;;  . BILIARY DILATION  08/27/2018   Procedure: BILIARY DILATION;  Surgeon: Irving Copas., MD;  Location: [place];  Service: Gastroenterology;;  . BILIARY STENT PLACEMENT  08/10/2018   Procedure: BILIARY STENT PLACEMENT;  Surgeon: Irving Copas., MD;  Location: [place];  Service: Gastroenterology;;  . BILIARY STENT PLACEMENT  08/27/2018   Procedure: BILIARY STENT PLACEMENT;  Surgeon: Irving Copas., MD;  Location: [place];  Service: Gastroenterology;;  . BILIARY STENT PLACEMENT  11/30/2018   Procedure: BILIARY STENT PLACEMENT;  Surgeon: Irving Copas.,  MD;  Location: [place];  Service: Gastroenterology;;  . BIOPSY  07/23/2018   Procedure: BIOPSY;  Surgeon: Irving Copas., MD;  Location: [place];  Service: Gastroenterology;;  . CHOLECYSTECTOMY N/A 09/02/2018   Procedure: LAPAROSCOPIC CHOLECYSTECTOMY;  Surgeon: Stark Klein, MD;  Location: [place];  Service: General;  Laterality: N/A;  . COLONOSCOPY     normal   .  ENDOSCOPIC RETROGRADE CHOLANGIOPANCREATOGRAPHY (ERCP) WITH PROPOFOL N/A 08/10/2018   Procedure: ENDOSCOPIC RETROGRADE CHOLANGIOPANCREATOGRAPHY (ERCP) WITH PROPOFOL;  Surgeon: Irving Copas., MD;  Location: [facility];  Service: Gastroenterology;  Laterality: N/A;  . ENDOSCOPIC RETROGRADE CHOLANGIOPANCREATOGRAPHY (ERCP) WITH PROPOFOL N/A 11/30/2018   Procedure: ENDOSCOPIC RETROGRADE CHOLANGIOPANCREATOGRAPHY (ERCP) WITH PROPOFOL;  Surgeon: Rush Landmark Telford Nab., MD;  Location: [facility];  Service: Gastroenterology;  Laterality: N/A;  . ERCP N/A 07/23/2018   Procedure: ENDOSCOPIC RETROGRADE CHOLANGIOPANCREATOGRAPHY (ERCP);  Surgeon: Irving Copas., MD;  Location: [place];  Service: Gastroenterology;  Laterality: N/A;  . ERCP N/A 08/27/2018   Procedure: ENDOSCOPIC RETROGRADE CHOLANGIOPANCREATOGRAPHY (ERCP);  Surgeon: Irving Copas., MD;  Location: [place];  Service: Gastroenterology;  Laterality: N/A;  . ESOPHAGOGASTRODUODENOSCOPY (EGD) WITH PROPOFOL N/A 08/27/2018   Procedure: ESOPHAGOGASTRODUODENOSCOPY (EGD) WITH PROPOFOL;  Surgeon: Rush Landmark Telford Nab., MD;  Location: [place];  Service: Gastroenterology;  Laterality: N/A;  . EUS N/A 08/27/2018   Procedure: ESOPHAGEAL ENDOSCOPIC ULTRASOUND (EUS) RADIAL;  Surgeon: Rush Landmark Telford Nab., MD;  Location: [place];  Service: Gastroenterology;  Laterality: N/A;  . FINE NEEDLE ASPIRATION  08/27/2018   Procedure: FINE NEEDLE ASPIRATION (FNA) LINEAR;  Surgeon: Irving Copas., MD;  Location: [place];  Service: Gastroenterology;;  . IR IMAGING GUIDED PORT INSERTION  01/08/2018  . LAPAROSCOPIC NEPHRECTOMY Left 08/01/2016   Procedure: LAPAROSCOPIC  RADICAL NEPHRECTOMY/ REPAIR OF UMBILICAL HERNIA;  Surgeon: Raynelle Bring, MD;  Location: [facility];  Service: Urology;  Laterality: Left;  . REMOVAL OF STONES  07/23/2018   Procedure: REMOVAL OF GALL STONES;  Surgeon: Rush Landmark Telford Nab., MD;  Location: [place];  Service: Gastroenterology;;  . REMOVAL OF STONES  08/10/2018   Procedure: REMOVAL OF STONES;  Surgeon: Irving Copas., MD;  Location: [place];  Service: Gastroenterology;;  . REMOVAL OF STONES  08/27/2018   Procedure: REMOVAL OF STONES;  Surgeon: Irving Copas., MD;  Location: [place];  Service: Gastroenterology;;  . [name]  07/23/2018   Procedure: [name];  Surgeon: Irving Copas., MD;  Location: [place];  Service: Gastroenterology;;  . Lavell Islam REMOVAL  08/27/2018   Procedure: STENT REMOVAL;  Surgeon: Irving Copas., MD;  Location: [place];  Service: Gastroenterology;;  . Lavell Islam REMOVAL  11/30/2018   Procedure: STENT REMOVAL;  Surgeon: Irving Copas., MD;  Location: [facility];  Service: Gastroenterology;;  . UPPER GI ENDOSCOPY     x 1   No reported past surgeries EGD 05/01/2016 Dr. Earley Brooke Colonoscopy 03/2016 Dr. Earley Brooke  SOCIAL HISTORY: Social History   Socioeconomic History  . Marital status: Married    Spouse name: Not on file  . Number of children: 3  . Years of education: Not on file  . Highest education level: Not on file  Occupational History  . Occupation: retired  Tobacco Use  . Smoking status: Former Smoker    Packs/day: 1.00    Years: 8.00    Pack years: 8.00    Types: Cigarettes    Quit date: 06/18/1964    Years since quitting: 55.5  . Smokeless tobacco: Never Used  Vaping Use  .  Vaping Use: Never used  Substance and Sexual Activity  . Alcohol use: No  . Drug use: No  . Sexual activity: Not on file  Other Topics Concern  . Not on file  Social History Narrative   Married.  Three children   Social Determinants of Health   Financial Resource Strain:   . Difficulty of Paying Living Expenses:   Food Insecurity:   . Worried About Charity fundraiser in the Last Year:   . Arboriculturist in the Last Year:   Transportation Needs:   . Film/video editor (Medical):   Marland Kitchen Lack  of Transportation (Non-Medical):   Physical Activity:   . Days of Exercise per Week:   . Minutes of Exercise per Session:   Stress:   . Feeling of Stress :   Social Connections:   . Frequency of Communication with Friends and Family:   . Frequency of Social Gatherings with Friends and Family:   . Attends Religious Services:   . Active Member of Clubs or Organizations:   . Attends Archivist Meetings:   Marland Kitchen Marital Status:   Intimate Partner Violence:   . Fear of Current or Ex-Partner:   . Emotionally Abused:   Marland Kitchen Physically Abused:   . Sexually Abused:    Patient lives in [place] Works as a Solicitor part-time Ex-smoker quit long time ago In 1961 . Smoked 2 packs per day for 8 years prior to that .  or a lot of stress since her daughter is also getting treated for stage IV uterine cancer.  FAMILY HISTORY:  Mother deceased Father with asthma and emphysema died at 68 years with the MI. Brother deceased of heart disease   ALLERGIES:  is allergic to adhesive [tape], gabapentin, nsaids, and statins.  MEDICATIONS:  Current Outpatient Medications  Medication Sig Dispense Refill  . amLODipine (NORVASC) 5 MG tablet Take 5 mg by mouth daily.   6  . atorvastatin (LIPITOR) 80 MG tablet Take 80 mg by mouth at bedtime.     . carvedilol (COREG) 6.25 MG tablet Take 6.25 mg by mouth 2 (two) times daily.  6  . cephALEXin (KEFLEX) 250 MG capsule Take 250 mg by mouth every Monday, Wednesday, and Friday.     . clobetasol ointment (TEMOVATE) 6.73 % Apply 1 application topically 2 (two) times daily as needed (lichen sclerosis).     . Denosumab (XGEVA [place]) Inject into the skin every 6 (six) weeks.    . diphenhydrAMINE (BENADRYL) 25 MG tablet Take 25 mg by mouth at bedtime as needed for sleep.    Marland Kitchen glimepiride (AMARYL) 1 MG tablet Take 1 mg by mouth daily with breakfast.    . hydrocortisone (CORTEF) 10 MG tablet Take 5-10 mg by mouth See admin instructions. Take 1 tablet (10  mg) by mouth in the morning & 1/2 tablet (5 mg) by mouth in the evening.    Marland Kitchen ibuprofen (ADVIL) 600 MG tablet Take 600 mg by mouth every 6 (six) hours as needed for moderate pain.    Marland Kitchen levothyroxine (SYNTHROID) 75 MCG tablet Take 75 mcg by mouth daily before breakfast.     . lidocaine (LIDODERM) 5 % APPLY 1  TOPICALLY ON RIGHT HIP ONCE DAILY. REMOVE AND DISCARD PATCH WITHIN 12 HOURS OR AS DIRECTED BY DOCTOR. (Patient not taking: Reported on 11/16/2019) 30 patch 0  . Lidocaine 4 % PTCH Apply 1 patch topically daily as needed (pain).    Marland Kitchen lidocaine-prilocaine (  EMLA) cream Apply 1 application topically daily as needed (prior to port access). 30 g 1  . Nivolumab (OPDIVO IV) Inject 1 Dose into the vein every 14 (fourteen) days. [place]    . omeprazole (PRILOSEC) 20 MG capsule Take 1 capsule (20 mg total) by mouth daily. 60 capsule 3  . oxyCODONE (OXY IR/ROXICODONE) 5 MG immediate release tablet Take 5 mg by mouth every 4 (four) hours as needed for pain.    . phenazopyridine (PYRIDIUM) 200 MG tablet Take 200 mg by mouth 3 (three) times daily as needed for pain.    . sitaGLIPtin (JANUVIA) 50 MG tablet Take 50 mg by mouth daily.     No current facility-administered medications for this visit.   Facility-Administered Medications Ordered in Other Visits  Medication Dose Route Frequency Provider Last Rate Last Admin  . sodium chloride flush (NS) 0.9 % injection 10 mL  10 mL Intracatheter PRN Truitt Merle, MD   10 mL at 11/20/18 1232    REVIEW OF SYSTEMS:   A 10+ POINT REVIEW OF SYSTEMS WAS OBTAINED including neurology, dermatology, psychiatry, cardiac, respiratory, lymph, extremities, GI, GU, Musculoskeletal, constitutional, breasts, reproductive, HEENT.  All pertinent positives are noted in the HPI.  All others are negative.   PHYSICAL EXAMINATION:  ECOG FS:2 - Symptomatic, <50% confined to bed  Vitals:   12/08/19 1051  BP: (!) 145/70  Pulse: 86  Resp: 18  Temp: 97.6 F (36.4 C)  SpO2: 98%    Wt Readings from Last 3 Encounters:  12/08/19 132 lb 9.6 oz (60.1 kg)  10/27/19 137 lb 8 oz (62.4 kg)  10/12/19 137 lb 8 oz (62.4 kg)   Body mass index is 26.78 kg/m.    GENERAL:alert, in no acute distress and comfortable SKIN: no acute rashes, no significant lesions EYES: conjunctiva are pink and non-injected, sclera anicteric OROPHARYNX: MMM, no exudates, no oropharyngeal erythema or ulceration NECK: supple, no JVD LYMPH:  no palpable lymphadenopathy in the cervical, axillary or inguinal regions LUNGS: clear to auscultation b/l with normal respiratory effort HEART: regular rate & rhythm ABDOMEN:  normoactive bowel sounds , non tender, not distended. No palpable hepatosplenomegaly.  Extremity: no pedal edema PSYCH: alert & oriented x 3 with fluent speech NEURO: no focal motor/sensory deficits  LABORATORY DATA:  I have reviewed the data as listed  . CBC Latest Ref Rng & Units 12/08/2019 11/10/2019 10/27/2019  WBC 4.0 - 10.5 K/uL 9.7 8.2 8.2  Hemoglobin 12.0 - 15.0 g/dL 11.9(L) 11.8(L) 12.1  Hematocrit 36 - 46 % 37.7 37.1 37.0  Platelets 150 - 400 K/uL 304 247 287    CBC    Component Value Date/Time   WBC 9.7 12/08/2019 0945   RBC 4.23 12/08/2019 0945   HGB 11.9 (L) 12/08/2019 0945   HGB 11.4 (L) 09/11/2018 0900   HGB 11.2 (L) 04/04/2017 0830   HCT 37.7 12/08/2019 0945   HCT 35.2 04/04/2017 0830   PLT 304 12/08/2019 0945   PLT 277 09/11/2018 0900   PLT 250 04/04/2017 0830   MCV 89.1 12/08/2019 0945   MCV 107.6 (H) 04/04/2017 0830   MCH 28.1 12/08/2019 0945   MCHC 31.6 12/08/2019 0945   RDW 14.0 12/08/2019 0945   RDW 14.0 04/04/2017 0830   LYMPHSABS 1.9 12/08/2019 0945   LYMPHSABS 1.6 04/04/2017 0830   MONOABS 1.1 (H) 12/08/2019 0945   MONOABS 0.4 04/04/2017 0830   EOSABS 0.3 12/08/2019 0945   EOSABS 0.1 04/04/2017 0830   BASOSABS 0.1 12/08/2019  0945   BASOSABS 0.0 04/04/2017 0830     . CMP Latest Ref Rng & Units 12/08/2019 11/10/2019 10/27/2019  Glucose 70 -  99 mg/dL 146(H) 185(H) 197(H)  BUN 8 - 23 mg/dL 40(H) 21 21  Creatinine 0.44 - 1.00 mg/dL 1.87(H) 1.50(H) 1.72(H)  Sodium 135 - 145 mmol/L 135 137 136  Potassium 3.5 - 5.1 mmol/L 4.4 4.5 4.1  Chloride 98 - 111 mmol/L 105 107 104  CO2 22 - 32 mmol/L 20(L) 21(L) 20(L)  Calcium 8.9 - 10.3 mg/dL 10.4(H) 8.8(L) 8.9  Total Protein 6.5 - 8.1 g/dL 6.9 6.7 6.7  Total Bilirubin 0.3 - 1.2 mg/dL 0.6 0.5 0.4  Alkaline Phos 38 - 126 U/L 68 70 63  AST 15 - 41 U/L 14(L) 19 19  ALT 0 - 44 U/L 11 22 29    B12 level 299 (OSH)-->455  . Lab Results  Component Value Date   LDH 115 12/08/2019        08/27/18 Surgical Pathology from Cholecystectomy:         RADIOGRAPHIC STUDIES: I have personally reviewed the radiological images as listed and agreed with the findings in the report. Nm Pet Image Restag (ps) Skull Base To Thigh  Result Date: 01/03/2017 IMPRESSION: 1. There is a new lesion in the hepatic dome which is FDG avid and low in attenuation on CT imaging consistent with metastatic disease. Another region of uptake in the left hepatic lobe posteriorly demonstrates no CT correlate. A second subtle metastasis is not excluded on today's study. An MRI could better assess for other hepatic metastases if clinically warranted. 2. New metastatic lesion in the left side of T8. 3. New pulmonary nodule in the right upper lobe. This is too small to characterize but suspicious. Recommend attention on follow-up. No FDG avid nodules or other enlarging nodules. 4. The uptake at the previous left renal artery has almost resolved in the interval and is favored to be post therapeutic. Recommend attention on follow-up. 5. The metastasis in the posterior right hilum seen previously has resolved. Electronically Signed   By: Dorise Bullion III M.D   On: 01/03/2017 11:16   .No results found.  ASSESSMENT & PLAN:    79 y.o. 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 PET/CT 06/19/2017 -- consistent with partial metabolic response to treatment.  Rt adrenal gland bx - showed clear cell RCC  S/p CYtoreductive left radical nephrectomy and left adrenal gland resection on 08/01/2016 by Dr Alinda Money.  10/16/17 PET/CT revealed Continued improvement, with the T7 metastatic lesion no longer significantly hypermetabolic. The previous right lower lobe pulmonary nodule is even less apparent, perhaps about 2 mm in diameter today; given that this measured 6 mm on 03/28/2017 this probably represents an effectively treated metastatic lesion. Currently no appreciable hypermetabolic activity is identified to suggest active malignancy. Distended gallbladder with gallbladder wall thickening and gallstones. Correlate clinically in assessing for cholecystitis. Small but abnormal amount of free pelvic fluid, nonspecific. Other imaging findings of potential clinical significance: Chronic ethmoid sinusitis. Aortic Atherosclerosis. Stable 5 mm right middle lobe pulmonary nodule, not hypermetabolic but below sensitive PET-CT size thresholds. Left nephrectomy. Notable pelvic floor laxity with cystocele. Chronic bilateral Sacroiliitis.  04/03/18 PET/CT revealed No evidence for new or progressive hypermetabolic disease on today's study to suggest metastatic progression. 2. Stable appearance of the T7 metastatic lesion without Hypermetabolism. 3. Tiny focus of  FDG accumulation identified along the skin of the low right inguinal fold. No associated lesion evident on CT. This may be related to urinary contaminant. 4. Cholelithiasis with similar appearance of diffuse gallbladder wall thickening. 5. Stable 5 mm right middle lobe pulmonary nodule. 6.  Aortic Atherosclerois. 7. Diffuse colonic diverticulosis.   09/18/18 CT A/P revealed "Common duct stent in place. Improvement to resolution in biliary duct dilatation compared to  08/29/2018. Interval cholecystectomy without acute complication. 2. Left nephrectomy, without evidence of metastatic disease. 3.  Tiny hiatal hernia. 4. Coronary artery atherosclerosis. Aortic Atherosclerosis. 5. Mild limitations secondary to lack of IV contrast. 6. Marked pelvic floor laxity with cystocele and rectal prolapse".  07/02/2019 PET/CT ([id number]) which revealed "1. No evidence of hypermetabolic metastatic disease."  #2 b/l adrenal metastases from [place] s/p left adrenalectomy with adrenal insuff - follows with Dr Buddy Duty.  #3 Small pulmonary lesions -- 10/16/17 PET/CT showed pulmonary less apparent than 03/28/17 PET/CT, decreasing from 59mm to 61mm diameter.   MRI brain shows no evidence of metastatic disease  #4 T7/8 Bone metastases - received Xgeva every 4 weeks from May 2018 to June 2019. 10/16/17 PET/CT revealed improvement with T7 metastatic lesion no longer significantly hypermetabolic.  -on Audrey [name]  #5 ?liver mets- rpt PET/CT from 10/16/2017 shows no overt evidence of metastatic disease in the liver.  #6 Grade 1 Nausea - improved and intermittent. Hasnt used her anti-emetic as instructed so some nausea and decreased po intake.  #7 Grade 1 Diarrhea - resolved  #8 Hyponatremia -  resolved with sodium at 136 - likely related to some element of adrenal insufficiency, diarrhea, limited by mouth intake. Primarily solute free fluid intake.   #9 Acute on chronic renal insufficiency Creatine stable 1.44  #10 Hyperkalemia due to ACEI + RF- resolved  #11 Hemorrhoids -chronic with some bleeding --Recommended Sitz bath and OTC Anusol or Nupercaine for her hemorrhoid relief. -f/u with PCP for continued mx   #12 Moderate protein calorie malnutrition Weight has stabilized and improved Wt Readings from Last 3 Encounters:  12/08/19 132 lb 9.6 oz (60.1 kg)  10/27/19 137 lb 8 oz (62.4 kg)  10/12/19 137 lb 8 oz (62.4 kg)   Plan: Continue healthy po intake/diabetic diet -Previously  recommended the patient to drink atleast 48-64 oz of fluids daily -f/u with PCP/Cardiology for diuretics management  #13 Hypothyroidism/Adrenal insufficiency/Diabetes -Continue being followed by Dr. Buddy Duty  #14 HTN - ?control. Patient tends to be anxious and has higher blood pressures in the clinic. She can have increased blood pressure from Sutent as well. Plan: -continued close f/u with her PCP /cardiology regarding the many elements necessary for her care that are not directly related to her oncology care. -ACEI held due to AKI and hyperkalemia - following with cardiology to mx this. Has been started on Amlodipine instead.  #15 Grade 1 mucositis- resolved -Advised the patient to continue use of Magic Mouthwash to aid with nutrition  #16 Newly diagnosed ?CAD - positive cardiac nuclear stress test Following with cardiology and nephrology   #17 h/o Choledocholithiasis and cholelithiasis 07/10/18 US Abdomen revealed Biliary duct dilatation with 8 mm calculus at the level of the ampulla in the distal common bile duct. 2. Cholelithiasis. No gallbladder wall thickening or pericholecystic fluid. 3. Appearance of the liver raises concern for underlying hepatic cirrhosis. No focal liver lesions are demonstrable. 4.  Left kidney absent. 5.  Small cysts in right kidney. 6.  Aortic Atherosclerosis.  S/p ERCP on 07/23/18 with Dr. Valarie Merino  [place]  09/02/18 Cytopathology from ERCP was suspicious for malignant cells in bile duct, but was not definitive, other two findings were benign.  PLAN:  -Discussed pt labwork today, 12/08/19; blood counts look good, blood chemistries are stable, LDH is WNL -No lab or clinical evidence of progression of pt's Left Renal Cell Carcinoma at this time.  -The pt has no prohibitive toxicities from continuing C45D1 of Nivolumab at this time. -Will see back in 6 weeks with labs    FOLLOW UP: Plz schedule next 4 Nivolumab infusions with portflush and labs MD visit in 6  weeks    The total time spent in the appt was 20 minutes and more than 50% was on counseling and direct patient cares.  All of the patient's questions were answered with apparent satisfaction. The patient knows to call the clinic with any problems, questions or concerns.   Sullivan Lone MD [place] AAHIVMS [facility] Hematology/Oncology Physician [facility]  (Office):       [phone] (Work cell):  [phone] (Fax):           [phone]  I, Yevette Edwards, am acting as a scribe for Dr. Sullivan Lone.   .I have reviewed the above documentation for accuracy and completeness, and I agree with the above. Brunetta Genera MD

## 2019-12-09 ENCOUNTER — Telehealth: Payer: Self-pay | Admitting: Hematology

## 2019-12-09 NOTE — Telephone Encounter (Signed)
Scheduled appointments per 8/18 los. Patient is aware of all appointments dates and times.

## 2019-12-22 ENCOUNTER — Inpatient Hospital Stay: Payer: Medicare Other

## 2019-12-22 ENCOUNTER — Inpatient Hospital Stay: Payer: Medicare Other | Attending: Hematology

## 2019-12-22 ENCOUNTER — Ambulatory Visit: Payer: Medicare Other

## 2019-12-22 ENCOUNTER — Other Ambulatory Visit: Payer: Self-pay

## 2019-12-22 VITALS — BP 131/55 | HR 64 | Temp 98.4°F | Resp 17 | Wt 135.2 lb

## 2019-12-22 DIAGNOSIS — Z5112 Encounter for antineoplastic immunotherapy: Secondary | ICD-10-CM

## 2019-12-22 DIAGNOSIS — Z7189 Other specified counseling: Secondary | ICD-10-CM

## 2019-12-22 DIAGNOSIS — Z79899 Other long term (current) drug therapy: Secondary | ICD-10-CM | POA: Diagnosis not present

## 2019-12-22 DIAGNOSIS — C642 Malignant neoplasm of left kidney, except renal pelvis: Secondary | ICD-10-CM | POA: Diagnosis present

## 2019-12-22 DIAGNOSIS — C7951 Secondary malignant neoplasm of bone: Secondary | ICD-10-CM

## 2019-12-22 DIAGNOSIS — Z95828 Presence of other vascular implants and grafts: Secondary | ICD-10-CM

## 2019-12-22 LAB — CBC WITH DIFFERENTIAL/PLATELET
Abs Immature Granulocytes: 0.12 10*3/uL — ABNORMAL HIGH (ref 0.00–0.07)
Basophils Absolute: 0.1 10*3/uL (ref 0.0–0.1)
Basophils Relative: 1 %
Eosinophils Absolute: 0.2 10*3/uL (ref 0.0–0.5)
Eosinophils Relative: 2 %
HCT: 35.1 % — ABNORMAL LOW (ref 36.0–46.0)
Hemoglobin: 11.1 g/dL — ABNORMAL LOW (ref 12.0–15.0)
Immature Granulocytes: 1 %
Lymphocytes Relative: 14 %
Lymphs Abs: 1.3 10*3/uL (ref 0.7–4.0)
MCH: 28.4 pg (ref 26.0–34.0)
MCHC: 31.6 g/dL (ref 30.0–36.0)
MCV: 89.8 fL (ref 80.0–100.0)
Monocytes Absolute: 1.1 10*3/uL — ABNORMAL HIGH (ref 0.1–1.0)
Monocytes Relative: 12 %
Neutro Abs: 6.8 10*3/uL (ref 1.7–7.7)
Neutrophils Relative %: 70 %
Platelets: 293 10*3/uL (ref 150–400)
RBC: 3.91 MIL/uL (ref 3.87–5.11)
RDW: 14.5 % (ref 11.5–15.5)
WBC: 9.6 10*3/uL (ref 4.0–10.5)
nRBC: 0 % (ref 0.0–0.2)

## 2019-12-22 LAB — CMP (CANCER CENTER ONLY)
ALT: 11 U/L (ref 0–44)
AST: 14 U/L — ABNORMAL LOW (ref 15–41)
Albumin: 3.4 g/dL — ABNORMAL LOW (ref 3.5–5.0)
Alkaline Phosphatase: 62 U/L (ref 38–126)
Anion gap: 10 (ref 5–15)
BUN: 21 mg/dL (ref 8–23)
CO2: 21 mmol/L — ABNORMAL LOW (ref 22–32)
Calcium: 8.7 mg/dL — ABNORMAL LOW (ref 8.9–10.3)
Chloride: 109 mmol/L (ref 98–111)
Creatinine: 1.45 mg/dL — ABNORMAL HIGH (ref 0.44–1.00)
GFR, Est AFR Am: 40 mL/min — ABNORMAL LOW (ref >60)
GFR, Estimated: 35 mL/min — ABNORMAL LOW (ref >60)
Glucose, Bld: 158 mg/dL — ABNORMAL HIGH (ref 70–99)
Potassium: 4.5 mmol/L (ref 3.5–5.1)
Sodium: 140 mmol/L (ref 135–145)
Total Bilirubin: 0.3 mg/dL (ref 0.3–1.2)
Total Protein: 6.5 g/dL (ref 6.5–8.1)

## 2019-12-22 LAB — LACTATE DEHYDROGENASE: LDH: 117 U/L (ref 98–192)

## 2019-12-22 MED ORDER — SODIUM CHLORIDE 0.9% FLUSH
10.0000 mL | INTRAVENOUS | Status: DC | PRN
  Administered 2019-12-22: 10 mL
  Filled 2019-12-22: qty 10

## 2019-12-22 MED ORDER — SODIUM CHLORIDE 0.9 % IV SOLN
240.0000 mg | Freq: Once | INTRAVENOUS | Status: AC
  Administered 2019-12-22: 240 mg via INTRAVENOUS
  Filled 2019-12-22: qty 24

## 2019-12-22 MED ORDER — SODIUM CHLORIDE 0.9 % IV SOLN
Freq: Once | INTRAVENOUS | Status: AC
  Filled 2019-12-22: qty 250

## 2019-12-22 MED ORDER — HEPARIN SOD (PORK) LOCK FLUSH 100 UNIT/ML IV SOLN
500.0000 [IU] | Freq: Once | INTRAVENOUS | Status: AC | PRN
  Administered 2019-12-22: 500 [IU]
  Filled 2019-12-22: qty 5

## 2019-12-22 MED ORDER — SODIUM CHLORIDE 0.9% FLUSH
10.0000 mL | Freq: Once | INTRAVENOUS | Status: AC
  Administered 2019-12-22: 10 mL
  Filled 2019-12-22: qty 10

## 2019-12-22 NOTE — Patient Instructions (Signed)

## 2019-12-22 NOTE — Patient Instructions (Signed)
[facility] Discharge Instructions for Patients Receiving Chemotherapy  Today you received the following chemotherapy agents Opdivo  To help prevent nausea and vomiting after your treatment, we encourage you to take your nausea medication as directed   If you develop nausea and vomiting that is not controlled by your nausea medication, call the clinic.   BELOW ARE SYMPTOMS THAT SHOULD BE REPORTED IMMEDIATELY:  *FEVER GREATER THAN 100.5 F  *CHILLS WITH OR WITHOUT FEVER  NAUSEA AND VOMITING THAT IS NOT CONTROLLED WITH YOUR NAUSEA MEDICATION  *UNUSUAL SHORTNESS OF BREATH  *UNUSUAL BRUISING OR BLEEDING  TENDERNESS IN MOUTH AND THROAT WITH OR WITHOUT PRESENCE OF ULCERS  *URINARY PROBLEMS  *BOWEL PROBLEMS  UNUSUAL RASH Items with * indicate a potential emergency and should be followed up as soon as possible.  Feel free to call the clinic should you have any questions or concerns. The clinic phone number is [phone].  Please show the CHEMO ALERT CARD at check-in to the Emergency Department and triage nurse.   

## 2020-01-05 ENCOUNTER — Telehealth: Payer: Self-pay | Admitting: Gastroenterology

## 2020-01-05 ENCOUNTER — Inpatient Hospital Stay: Payer: Medicare Other

## 2020-01-05 ENCOUNTER — Other Ambulatory Visit: Payer: Self-pay

## 2020-01-05 ENCOUNTER — Ambulatory Visit (INDEPENDENT_AMBULATORY_CARE_PROVIDER_SITE_OTHER)
Admission: RE | Admit: 2020-01-05 | Discharge: 2020-01-05 | Disposition: A | Discharge status: Home / Self Care | Payer: Medicare Other | Source: Ambulatory Visit | Attending: Gastroenterology | Admitting: Gastroenterology

## 2020-01-05 ENCOUNTER — Ambulatory Visit: Payer: Medicare Other

## 2020-01-05 ENCOUNTER — Telehealth: Payer: Self-pay

## 2020-01-05 VITALS — BP 132/78 | HR 59 | Temp 98.6°F | Resp 18

## 2020-01-05 DIAGNOSIS — K5900 Constipation, unspecified: Secondary | ICD-10-CM | POA: Diagnosis not present

## 2020-01-05 DIAGNOSIS — C642 Malignant neoplasm of left kidney, except renal pelvis: Secondary | ICD-10-CM

## 2020-01-05 DIAGNOSIS — R140 Abdominal distension (gaseous): Secondary | ICD-10-CM

## 2020-01-05 DIAGNOSIS — C7951 Secondary malignant neoplasm of bone: Secondary | ICD-10-CM

## 2020-01-05 DIAGNOSIS — Z5112 Encounter for antineoplastic immunotherapy: Secondary | ICD-10-CM

## 2020-01-05 DIAGNOSIS — Z95828 Presence of other vascular implants and grafts: Secondary | ICD-10-CM

## 2020-01-05 LAB — CMP (CANCER CENTER ONLY)
ALT: 231 U/L — ABNORMAL HIGH (ref 0–44)
AST: 98 U/L — ABNORMAL HIGH (ref 15–41)
Albumin: 3.5 g/dL (ref 3.5–5.0)
Alkaline Phosphatase: 134 U/L — ABNORMAL HIGH (ref 38–126)
Anion gap: 8 (ref 5–15)
BUN: 19 mg/dL (ref 8–23)
CO2: 25 mmol/L (ref 22–32)
Calcium: 9.8 mg/dL (ref 8.9–10.3)
Chloride: 102 mmol/L (ref 98–111)
Creatinine: 1.61 mg/dL — ABNORMAL HIGH (ref 0.44–1.00)
GFR, Est AFR Am: 35 mL/min — ABNORMAL LOW (ref >60)
GFR, Estimated: 31 mL/min — ABNORMAL LOW (ref >60)
Glucose, Bld: 167 mg/dL — ABNORMAL HIGH (ref 70–99)
Potassium: 4.6 mmol/L (ref 3.5–5.1)
Sodium: 135 mmol/L (ref 135–145)
Total Bilirubin: 0.5 mg/dL (ref 0.3–1.2)
Total Protein: 6.9 g/dL (ref 6.5–8.1)

## 2020-01-05 LAB — CBC WITH DIFFERENTIAL/PLATELET
Abs Immature Granulocytes: 0.08 10*3/uL — ABNORMAL HIGH (ref 0.00–0.07)
Basophils Absolute: 0.1 10*3/uL (ref 0.0–0.1)
Basophils Relative: 1 %
Eosinophils Absolute: 0.2 10*3/uL (ref 0.0–0.5)
Eosinophils Relative: 2 %
HCT: 37.5 % (ref 36.0–46.0)
Hemoglobin: 11.9 g/dL — ABNORMAL LOW (ref 12.0–15.0)
Immature Granulocytes: 1 %
Lymphocytes Relative: 14 %
Lymphs Abs: 1.3 10*3/uL (ref 0.7–4.0)
MCH: 28.4 pg (ref 26.0–34.0)
MCHC: 31.7 g/dL (ref 30.0–36.0)
MCV: 89.5 fL (ref 80.0–100.0)
Monocytes Absolute: 1 10*3/uL (ref 0.1–1.0)
Monocytes Relative: 10 %
Neutro Abs: 6.6 10*3/uL (ref 1.7–7.7)
Neutrophils Relative %: 72 %
Platelets: 326 10*3/uL (ref 150–400)
RBC: 4.19 MIL/uL (ref 3.87–5.11)
RDW: 14.5 % (ref 11.5–15.5)
WBC: 9.2 10*3/uL (ref 4.0–10.5)
nRBC: 0 % (ref 0.0–0.2)

## 2020-01-05 MED ORDER — SODIUM CHLORIDE 0.9% FLUSH
10.0000 mL | INTRAVENOUS | Status: AC | PRN
  Administered 2020-01-05: 10 mL
  Filled 2020-01-05: qty 10

## 2020-01-05 MED ORDER — ACETAMINOPHEN 325 MG PO TABS
ORAL_TABLET | ORAL | Status: AC
  Filled 2020-01-05: qty 2

## 2020-01-05 MED ORDER — DENOSUMAB 120 MG/1.7ML ~~LOC~~ SOLN
120.0000 mg | Freq: Once | SUBCUTANEOUS | Status: AC
  Administered 2020-01-05: 120 mg via SUBCUTANEOUS

## 2020-01-05 MED ORDER — HEPARIN SOD (PORK) LOCK FLUSH 100 UNIT/ML IV SOLN
500.0000 [IU] | INTRAVENOUS | Status: AC | PRN
  Administered 2020-01-05: 500 [IU]
  Filled 2020-01-05: qty 5

## 2020-01-05 MED ORDER — ACETAMINOPHEN 325 MG PO TABS
650.0000 mg | ORAL_TABLET | Freq: Once | ORAL | Status: AC
  Administered 2020-01-05: 650 mg via ORAL

## 2020-01-05 MED ORDER — DENOSUMAB 120 MG/1.7ML ~~LOC~~ SOLN
SUBCUTANEOUS | Status: AC
  Filled 2020-01-05: qty 1.7

## 2020-01-05 NOTE — Progress Notes (Signed)
Per [name], Patient will not receive treatment due to labs, but will receive xgeva

## 2020-01-05 NOTE — Telephone Encounter (Signed)
Pt came by office this afternoon after leaving Oncology office with some concerns of not being able to have treatment today due to elevated LFT's.Pt stated that she has been taking larger amounts of Tylenol(no more than 2,000mg  daily)over the last 4-5 days due to increase pain. Pt thinks this may have effected her LFT's. Pt also c/o increased constipation and abd bloating. 2 view abd xray was ordered and samples of Linzess 271mcg was given to pt. Per Dr Rush Landmark abd xray showed that biliary duct stent was still in place and did not show any bowel obstructions. Pt to take Linzess this afternoon and we will monitor/reck  LFT's in a few days. Pt has been informed and I will follow-up with pt on tomorrow to see how she is feeling.

## 2020-01-05 NOTE — Progress Notes (Signed)
[name] - verbal order: Hold Opdivo today with Creatinine 1.61,AST 98/ALT 231. Ok to receive [place] today. Per [name], instruct patient to contact Dr. Rush Landmark.

## 2020-01-05 NOTE — Telephone Encounter (Signed)
Patient called requesting to speak with a nurse in reference to the medication given to her this morning and the xray.

## 2020-01-05 NOTE — Telephone Encounter (Signed)
I did speak with pt this afternoon. Please see other note.

## 2020-01-05 NOTE — Telephone Encounter (Signed)
Ro did you speak with this patient today.  She says someone was going to talk to Dr Rush Landmark at 1:30 and get back to her.  I am not sure what she is talking about I do not see a note.

## 2020-01-06 NOTE — Telephone Encounter (Signed)
Audrey [name], Please let patient know the final results of KUB show evidence of a significant stool burden and constipation but showed no evidence of a bowel obstruction. Hopefully patient is doing well. Maybe she can get LFTs done locally if there is a [facility] or [facility] next week or at the oncology/cancer clinic. We will still plan to keep current timing for ERCP evaluation as a not sure we have anything sooner. If something else develops or the liver tests continue to progress and certainly will try to work something sooner if possible. Thanks. GM

## 2020-01-07 NOTE — Telephone Encounter (Signed)
I called and spoke with the patient this morning. She continues to have abdominal discomfort. She describes the pain as sharp and burning at times.  It is radiating from the upper abdomen to the lower abdomen but mostly centered in the midepigastrium.  She describes postprandial discomfort 30 to 40 minutes after eating with worsening upon food intake.  Milk sometimes helps the discomfort. She had a bowel movement with Linzess and will continue taking the medication at this time. The patient is actually at the doctor's office to follow-up from her recent gynecologic surgery. I recommend that the patient have her PPI increased to 40 mg twice daily for the next month. I also recommend that Pepto-Bismol be considered once to twice daily as needed. The patient should undergo laboratory testing including a hepatic function panel next week.  She lives in [place] and has difficulty at times of getting to [place].  Hopefully, we can have her labs done at [facility] or [place] in [place] and sent to [name].  Would like to try and get these done on Tuesday so hopefully we can get some results before the end of the week. If patient is continuing to have discomfort at time of her laboratories next week, then cross-sectional imaging with a CT abdomen/pelvis with IV and oral contrast should be performed.  I will update my team and they will work on helping to arrange the recommendations as above.

## 2020-01-11 ENCOUNTER — Other Ambulatory Visit: Payer: Self-pay

## 2020-01-11 DIAGNOSIS — R945 Abnormal results of liver function studies: Secondary | ICD-10-CM

## 2020-01-11 DIAGNOSIS — K8050 Calculus of bile duct without cholangitis or cholecystitis without obstruction: Secondary | ICD-10-CM

## 2020-01-11 DIAGNOSIS — Z9889 Other specified postprocedural states: Secondary | ICD-10-CM

## 2020-01-11 DIAGNOSIS — K831 Obstruction of bile duct: Secondary | ICD-10-CM

## 2020-01-11 DIAGNOSIS — R7989 Other specified abnormal findings of blood chemistry: Secondary | ICD-10-CM

## 2020-01-11 DIAGNOSIS — K5900 Constipation, unspecified: Secondary | ICD-10-CM

## 2020-01-11 NOTE — Telephone Encounter (Signed)
Audrey [name], can you please reach out to the patient. I would like a set of repeat hepatic function panel performed. The can be done through [facility] versus at [facility] in [place] my preference would be here at [facility]. Please see what she would like to do. Thanks. GM

## 2020-01-11 NOTE — Telephone Encounter (Signed)
Pt will have labs ( HFP) done on 01/12/2020 at [facility] (pt preference). Pt will go to main entrance and register, then go to lab. Pt has been informed. See mychart message as well.

## 2020-01-12 ENCOUNTER — Other Ambulatory Visit (HOSPITAL_COMMUNITY)
Admission: RE | Admit: 2020-01-12 | Discharge: 2020-01-12 | Disposition: A | Discharge status: Home / Self Care | Payer: Medicare Other | Source: Ambulatory Visit | Attending: Gastroenterology | Admitting: Gastroenterology

## 2020-01-12 DIAGNOSIS — R945 Abnormal results of liver function studies: Secondary | ICD-10-CM | POA: Insufficient documentation

## 2020-01-12 DIAGNOSIS — K5900 Constipation, unspecified: Secondary | ICD-10-CM | POA: Insufficient documentation

## 2020-01-12 DIAGNOSIS — K831 Obstruction of bile duct: Secondary | ICD-10-CM | POA: Diagnosis present

## 2020-01-12 DIAGNOSIS — Z9889 Other specified postprocedural states: Secondary | ICD-10-CM | POA: Diagnosis present

## 2020-01-12 DIAGNOSIS — K8050 Calculus of bile duct without cholangitis or cholecystitis without obstruction: Secondary | ICD-10-CM | POA: Insufficient documentation

## 2020-01-12 LAB — HEPATIC FUNCTION PANEL
ALT: 37 U/L (ref 0–44)
AST: 26 U/L (ref 15–41)
Albumin: 3.9 g/dL (ref 3.5–5.0)
Alkaline Phosphatase: 87 U/L (ref 38–126)
Bilirubin, Direct: 0.1 mg/dL (ref 0.0–0.2)
Indirect Bilirubin: 0.4 mg/dL (ref 0.3–0.9)
Total Bilirubin: 0.5 mg/dL (ref 0.3–1.2)
Total Protein: 7.3 g/dL (ref 6.5–8.1)

## 2020-01-13 ENCOUNTER — Telehealth: Payer: Self-pay | Admitting: Gastroenterology

## 2020-01-13 NOTE — Telephone Encounter (Signed)
I called and spoke with the patient today. She continues to have discomfort in the upper abdomen. Hepatic function panel was normal. She stopped taking all Tylenol (she was only taking 2 g/day). She is having small bowel movements with the Linzess 290 mcg. She will continue this for now. With the pain persisting I think cross-sectional imaging is necessary at this time. 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 I will forward this message to 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 We will plan to try and get this arranged if possible for next Wednesday when she will be in [place] to see oncology, get chemotherapy, get a Covid test. Patty, please move forward with scheduling the CT scan. Oral contrast is okay. She will increase her omeprazole to 20 mg twice daily for now. At time of ERCP we will plan to do a formal EGD and ensure she does not have peptic ulcer disease. Discussed consideration of tramadol or oxycodone use for her discomfort but she is hesitant as MI about her issues with constipation so that is being held. I think it is okay for her to take up to 1000 mg of Tylenol daily but she does not want to miss another week of chemotherapy so she wants to hold on that in regards to her liver tests. With the improvement in her liver tests it suggest that the stent is unlikely to be an issue but certainly we will see how things go. Patient appreciative for the call back.   Justice Britain, MD [facility] Office # [phone]

## 2020-01-14 NOTE — Telephone Encounter (Signed)
Dr Rush Landmark just to confirm we are waiting for Dr Irene Limbo to respond prior to making an appt for CT scan?

## 2020-01-14 NOTE — Telephone Encounter (Signed)
Left message on machine to call back  

## 2020-01-14 NOTE — Telephone Encounter (Signed)
Go ahead with CTAP non-contrast. If he replies and wants to do a PET-CT then we will have to re-arrange. Thanks. GM

## 2020-01-17 NOTE — Telephone Encounter (Signed)
Dr Irene Limbo  would you like a CT abd/pelvis or PET?

## 2020-01-19 ENCOUNTER — Inpatient Hospital Stay: Payer: Medicare Other

## 2020-01-19 ENCOUNTER — Inpatient Hospital Stay (HOSPITAL_BASED_OUTPATIENT_CLINIC_OR_DEPARTMENT_OTHER): Payer: Medicare Other | Admitting: Hematology

## 2020-01-19 ENCOUNTER — Other Ambulatory Visit: Payer: Medicare Other

## 2020-01-19 ENCOUNTER — Ambulatory Visit: Payer: Medicare Other

## 2020-01-19 ENCOUNTER — Encounter (HOSPITAL_COMMUNITY): Payer: Self-pay | Admitting: Gastroenterology

## 2020-01-19 ENCOUNTER — Other Ambulatory Visit: Payer: Self-pay

## 2020-01-19 VITALS — BP 138/62 | HR 70 | Temp 97.3°F | Resp 18 | Ht 59.0 in | Wt 134.1 lb

## 2020-01-19 DIAGNOSIS — C7951 Secondary malignant neoplasm of bone: Secondary | ICD-10-CM

## 2020-01-19 DIAGNOSIS — Z5112 Encounter for antineoplastic immunotherapy: Secondary | ICD-10-CM

## 2020-01-19 DIAGNOSIS — C642 Malignant neoplasm of left kidney, except renal pelvis: Secondary | ICD-10-CM

## 2020-01-19 DIAGNOSIS — Z20822 Contact with and (suspected) exposure to covid-19: Secondary | ICD-10-CM

## 2020-01-19 DIAGNOSIS — Z95828 Presence of other vascular implants and grafts: Secondary | ICD-10-CM

## 2020-01-19 DIAGNOSIS — Z7189 Other specified counseling: Secondary | ICD-10-CM

## 2020-01-19 LAB — CBC WITH DIFFERENTIAL/PLATELET
Abs Immature Granulocytes: 0.1 10*3/uL — ABNORMAL HIGH (ref 0.00–0.07)
Basophils Absolute: 0.1 10*3/uL (ref 0.0–0.1)
Basophils Relative: 1 %
Eosinophils Absolute: 0.2 10*3/uL (ref 0.0–0.5)
Eosinophils Relative: 2 %
HCT: 35.5 % — ABNORMAL LOW (ref 36.0–46.0)
Hemoglobin: 11.2 g/dL — ABNORMAL LOW (ref 12.0–15.0)
Immature Granulocytes: 1 %
Lymphocytes Relative: 11 %
Lymphs Abs: 1.1 10*3/uL (ref 0.7–4.0)
MCH: 28 pg (ref 26.0–34.0)
MCHC: 31.5 g/dL (ref 30.0–36.0)
MCV: 88.8 fL (ref 80.0–100.0)
Monocytes Absolute: 1 10*3/uL (ref 0.1–1.0)
Monocytes Relative: 10 %
Neutro Abs: 7.1 10*3/uL (ref 1.7–7.7)
Neutrophils Relative %: 75 %
Platelets: 320 10*3/uL (ref 150–400)
RBC: 4 MIL/uL (ref 3.87–5.11)
RDW: 14.3 % (ref 11.5–15.5)
WBC: 9.5 10*3/uL (ref 4.0–10.5)
nRBC: 0 % (ref 0.0–0.2)

## 2020-01-19 LAB — CMP (CANCER CENTER ONLY)
ALT: 33 U/L (ref 0–44)
AST: 23 U/L (ref 15–41)
Albumin: 3.3 g/dL — ABNORMAL LOW (ref 3.5–5.0)
Alkaline Phosphatase: 89 U/L (ref 38–126)
Anion gap: 6 (ref 5–15)
BUN: 28 mg/dL — ABNORMAL HIGH (ref 8–23)
CO2: 22 mmol/L (ref 22–32)
Calcium: 8.1 mg/dL — ABNORMAL LOW (ref 8.9–10.3)
Chloride: 112 mmol/L — ABNORMAL HIGH (ref 98–111)
Creatinine: 1.87 mg/dL — ABNORMAL HIGH (ref 0.44–1.00)
GFR, Est AFR Am: 30 mL/min — ABNORMAL LOW (ref >60)
GFR, Estimated: 25 mL/min — ABNORMAL LOW (ref >60)
Glucose, Bld: 179 mg/dL — ABNORMAL HIGH (ref 70–99)
Potassium: 4.1 mmol/L (ref 3.5–5.1)
Sodium: 140 mmol/L (ref 135–145)
Total Bilirubin: 0.4 mg/dL (ref 0.3–1.2)
Total Protein: 6.7 g/dL (ref 6.5–8.1)

## 2020-01-19 MED ORDER — HEPARIN SOD (PORK) LOCK FLUSH 100 UNIT/ML IV SOLN
500.0000 [IU] | Freq: Once | INTRAVENOUS | Status: AC | PRN
  Administered 2020-01-19: 500 [IU]
  Filled 2020-01-19: qty 5

## 2020-01-19 MED ORDER — EPINEPHRINE HCL 0.1 MG/ML IJ SOLN: 0.2500 mg | Freq: Once | INTRAMUSCULAR | Status: DC | PRN

## 2020-01-19 MED ORDER — SODIUM CHLORIDE 0.9 % IV SOLN
Freq: Once | INTRAVENOUS | Status: AC
  Filled 2020-01-19: qty 250

## 2020-01-19 MED ORDER — DIPHENHYDRAMINE HCL 50 MG/ML IJ SOLN: 50.0000 mg | Freq: Once | INTRAMUSCULAR | Status: DC | PRN

## 2020-01-19 MED ORDER — SODIUM CHLORIDE 0.9% FLUSH
10.0000 mL | Freq: Once | INTRAVENOUS | Status: AC
  Administered 2020-01-19: 10 mL
  Filled 2020-01-19: qty 10

## 2020-01-19 MED ORDER — SODIUM CHLORIDE 0.9 % IV SOLN
240.0000 mg | Freq: Once | INTRAVENOUS | Status: AC
  Administered 2020-01-19: 240 mg via INTRAVENOUS
  Filled 2020-01-19: qty 24

## 2020-01-19 MED ORDER — ALBUTEROL SULFATE (2.5 MG/3ML) 0.083% IN NEBU
2.5000 mg | INHALATION_SOLUTION | Freq: Once | RESPIRATORY_TRACT | Status: DC | PRN
  Filled 2020-01-19: qty 3

## 2020-01-19 MED ORDER — DIPHENHYDRAMINE HCL 50 MG/ML IJ SOLN: 25.0000 mg | Freq: Once | INTRAMUSCULAR | Status: DC | PRN

## 2020-01-19 MED ORDER — METHYLPREDNISOLONE SODIUM SUCC 125 MG IJ SOLR: 125.0000 mg | Freq: Once | INTRAMUSCULAR | Status: DC | PRN

## 2020-01-19 MED ORDER — SODIUM CHLORIDE 0.9 % IV SOLN
Freq: Once | INTRAVENOUS | Status: DC | PRN
  Filled 2020-01-19: qty 250

## 2020-01-19 MED ORDER — SODIUM CHLORIDE 0.9% FLUSH
10.0000 mL | INTRAVENOUS | Status: DC | PRN
  Administered 2020-01-19: 10 mL
  Filled 2020-01-19: qty 10

## 2020-01-19 NOTE — Progress Notes (Signed)
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 She is here for C48D1 of Nivolumab. The patient's last visit with [name] was on 12/08/2019. The pt reports that she is doing well overall.  The pt reports that she was taking 2 grams of Tylenol per day prior to our last labs. She has since discontinued Tylenol completely.   She has been having mid-abdominal pain since the beginning of September. Dr. Rush Landmark is planning on ordering an abdominal CT and repeating the ERCP on 01/24/20. Pt was having constipation. She is able to pass her bowels more easily since beginning Linzess.  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 Pt has not felt any relief since beginning the medication. She admits that her blood sugars have not been well controlled. She is on Januvia for her Diabetes.  Lab results today (01/19/20) of CBC w/diff and CMP is as follows: all values are WNL except for Hgb at 11.2, HCT at 35.5, Abs Immature Granulocytes at 0.10K, Chloride at 112, Glucose at 179, BUN at 28, Creatinine at 1.87, Calcium at 8.1, Albumin at 3.3, GFR Est Non Af Am at 25.  On review of systems, pt reports neuropathy in feet, abdominal pain and denies fevers, chills, night sweats, leg swelling and any other symptoms.   MEDICAL HISTORY:   Past Medical History:  Diagnosis Date  . Anemia   . Arthritis    lower back, hips, hands  . Biliary stricture   . Diabetes mellitus ([place])    type 2 - no meds, diet controlled    . Early cataracts, bilateral   . Elevated liver enzymes   . GERD (gastroesophageal reflux disease)    occasional - diet controlled  . History of hiatal hernia   . HTN (hypertension)   . Hyperlipidemia   . Hypothyroidism   . left renal ca dx'd 2018   renal cancer - left kidney removed, pill chemo x 1 yr  . Myocardial infarction ([place]) 1991   no deficits  . SVD (spontaneous vaginal delivery)    x 3  . Wears glasses    Dyslipidemia Osteoarthritis Ex-smoker Coronary artery disease Thyroid disorder-was apparently on levothyroxine 25 g daily which has subsequently been discontinued . Mitral regurgitation B12 deficiency  hiatal hernia with esophagitis   SURGICAL HISTORY: Past Surgical History:  Procedure Laterality Date  . BALLOON DILATION N/A 07/23/2018   Procedure: BALLOON DILATION;  Surgeon: Rush Landmark Telford Nab., MD;  Location: [facility];  Service: Gastroenterology;  Laterality: N/A;  . BILIARY BRUSHING  08/10/2018   Procedure: BILIARY BRUSHING;  Surgeon: Rush Landmark Telford Nab., MD;  Location: [facility];  Service: Gastroenterology;;  . BILIARY BRUSHING  11/30/2018   Procedure: BILIARY BRUSHING;  Surgeon: Irving Copas., MD;  Location: [facility];  Service: Gastroenterology;;  . BILIARY DILATION  08/10/2018   Procedure: BILIARY DILATION;  Surgeon: Irving Copas., MD;  Location: [facility];  Service: Gastroenterology;;  . BILIARY DILATION  08/27/2018   Procedure: BILIARY DILATION;  Surgeon: Irving Copas.,  MD;  Location: [facility];  Service: Gastroenterology;;  . BILIARY STENT PLACEMENT  08/10/2018   Procedure: BILIARY STENT PLACEMENT;  Surgeon: Irving Copas., MD;  Location: [facility];  Service: Gastroenterology;;  . BILIARY STENT PLACEMENT  08/27/2018   Procedure: BILIARY STENT PLACEMENT;  Surgeon: Irving Copas., MD;  Location: [facility];  Service: Gastroenterology;;  . BILIARY STENT PLACEMENT  11/30/2018   Procedure:  BILIARY STENT PLACEMENT;  Surgeon: Irving Copas., MD;  Location: [facility];  Service: Gastroenterology;;  . BIOPSY  07/23/2018   Procedure: BIOPSY;  Surgeon: Irving Copas., MD;  Location: [facility];  Service: Gastroenterology;;  . CHOLECYSTECTOMY N/A 09/02/2018   Procedure: LAPAROSCOPIC CHOLECYSTECTOMY;  Surgeon: Stark Klein, MD;  Location: [facility];  Service: General;  Laterality: N/A;  . COLONOSCOPY     normal   . ENDOSCOPIC RETROGRADE CHOLANGIOPANCREATOGRAPHY (ERCP) WITH PROPOFOL N/A 08/10/2018   Procedure: ENDOSCOPIC RETROGRADE CHOLANGIOPANCREATOGRAPHY (ERCP) WITH PROPOFOL;  Surgeon: Irving Copas., MD;  Location: [facility];  Service: Gastroenterology;  Laterality: N/A;  . ENDOSCOPIC RETROGRADE CHOLANGIOPANCREATOGRAPHY (ERCP) WITH PROPOFOL N/A 11/30/2018   Procedure: ENDOSCOPIC RETROGRADE CHOLANGIOPANCREATOGRAPHY (ERCP) WITH PROPOFOL;  Surgeon: Rush Landmark Telford Nab., MD;  Location: [facility];  Service: Gastroenterology;  Laterality: N/A;  . ERCP N/A 07/23/2018   Procedure: ENDOSCOPIC RETROGRADE CHOLANGIOPANCREATOGRAPHY (ERCP);  Surgeon: Irving Copas., MD;  Location: [facility];  Service: Gastroenterology;  Laterality: N/A;  . ERCP N/A 08/27/2018   Procedure: ENDOSCOPIC RETROGRADE CHOLANGIOPANCREATOGRAPHY (ERCP);  Surgeon: Irving Copas., MD;  Location: [facility];  Service: Gastroenterology;  Laterality: N/A;  . ESOPHAGOGASTRODUODENOSCOPY (EGD) WITH PROPOFOL N/A 08/27/2018   Procedure: ESOPHAGOGASTRODUODENOSCOPY (EGD) WITH PROPOFOL;  Surgeon: Rush Landmark Telford Nab., MD;  Location: [facility];  Service: Gastroenterology;  Laterality: N/A;  . EUS N/A 08/27/2018   Procedure: ESOPHAGEAL ENDOSCOPIC ULTRASOUND (EUS) RADIAL;  Surgeon: Rush Landmark Telford Nab., MD;  Location: [facility];  Service: Gastroenterology;  Laterality: N/A;  . FINE NEEDLE ASPIRATION  08/27/2018   Procedure: FINE NEEDLE ASPIRATION (FNA) LINEAR;  Surgeon: Irving Copas., MD;  Location: [place];  Service: Gastroenterology;;  . IR IMAGING GUIDED PORT INSERTION  01/08/2018  . LAPAROSCOPIC NEPHRECTOMY Left 08/01/2016   Procedure: LAPAROSCOPIC  RADICAL NEPHRECTOMY/ REPAIR OF UMBILICAL HERNIA;  Surgeon: Raynelle Bring, MD;  Location: [facility];  Service: Urology;  Laterality: Left;  . REMOVAL OF STONES  07/23/2018   Procedure: REMOVAL OF GALL STONES;  Surgeon: Rush Landmark Telford Nab., MD;  Location: [facility];  Service: Gastroenterology;;  . REMOVAL OF STONES  08/10/2018   Procedure: REMOVAL OF STONES;  Surgeon: Irving Copas., MD;  Location: [facility];  Service: Gastroenterology;;  . REMOVAL OF STONES  08/27/2018   Procedure: REMOVAL OF STONES;  Surgeon: Irving Copas., MD;  Location: [facility];  Service: Gastroenterology;;  . [name]  07/23/2018   Procedure: [name];  Surgeon: Irving Copas., MD;  Location: [facility];  Service: Gastroenterology;;  . Lavell Islam REMOVAL  08/27/2018   Procedure: STENT REMOVAL;  Surgeon: Irving Copas., MD;  Location: [facility];  Service: Gastroenterology;;  . Lavell Islam REMOVAL  11/30/2018   Procedure: STENT REMOVAL;  Surgeon: Irving Copas., MD;  Location: [facility];  Service: Gastroenterology;;  . UPPER GI ENDOSCOPY     x 1   No reported past surgeries EGD 05/01/2016 Dr. Earley Brooke Colonoscopy 03/2016 Dr. Earley Brooke  SOCIAL HISTORY: Social History   Socioeconomic History  . Marital status: Married    Spouse name: Not on file  . Number of children:  3  . Years of education: Not on file  . Highest education level: Not on file  Occupational History  . Occupation: retired  Tobacco Use  . Smoking status: Former Smoker    Packs/day: 1.00    Years: 8.00    Pack years: 8.00    Types: Cigarettes    Quit date: 06/18/1964    Years since quitting: 55.6  . Smokeless tobacco: Never Used  Vaping Use  . Vaping Use: Never used  Substance and Sexual Activity  . Alcohol  use: No  . Drug use: No  . Sexual activity: Not on file  Other Topics Concern  . Not on file  Social History Narrative   Married.  Three children   Social Determinants of Health   Financial Resource Strain:   . Difficulty of Paying Living Expenses: Not on file  Food Insecurity:   . Worried About Charity fundraiser in the Last Year: Not on file  . Ran Out of Food in the Last Year: Not on file  Transportation Needs:   . Lack of Transportation (Medical): Not on file  . Lack of Transportation (Non-Medical): Not on file  Physical Activity:   . Days of Exercise per Week: Not on file  . Minutes of Exercise per Session: Not on file  Stress:   . Feeling of Stress : Not on file  Social Connections:   . Frequency of Communication with Friends and Family: Not on file  . Frequency of Social Gatherings with Friends and Family: Not on file  . Attends Religious Services: Not on file  . Active Member of Clubs or Organizations: Not on file  . Attends Archivist Meetings: Not on file  . Marital Status: Not on file  Intimate Partner Violence:   . Fear of Current or Ex-Partner: Not on file  . Emotionally Abused: Not on file  . Physically Abused: Not on file  . Sexually Abused: Not on file   Patient lives in [place] Works as a Solicitor part-time Ex-smoker quit long time ago In 1961 . Smoked 2 packs per day for 8 years prior to that .  or a lot of stress since her daughter is also getting treated for stage IV uterine cancer.  FAMILY HISTORY:  Mother deceased Father with asthma and emphysema died at 5 years with the MI. Brother deceased of heart disease   ALLERGIES:  is allergic to adhesive [tape], nsaids, and statins.  MEDICATIONS:  Current Outpatient Medications  Medication Sig Dispense Refill  . amLODipine (NORVASC) 5 MG tablet Take 5 mg by mouth daily.   6  . atorvastatin (LIPITOR) 80 MG tablet Take 80 mg by mouth at bedtime.     . carvedilol (COREG)  6.25 MG tablet Take 6.25 mg by mouth 2 (two) times daily.  6  . cephALEXin (KEFLEX) 250 MG capsule Take 250 mg by mouth every Monday, Wednesday, and Friday.     . clobetasol ointment (TEMOVATE) 1.02 % Apply 1 application topically 2 (two) times daily as needed (lichen sclerosis).     . Denosumab (XGEVA [place]) Inject into the skin every 6 (six) weeks.    . diphenhydrAMINE (BENADRYL) 25 MG tablet Take 25 mg by mouth at bedtime as needed for sleep.    Marland Kitchen gabapentin (NEURONTIN) 100 MG capsule Take 100 mg by mouth 3 (three) times daily.    Marland Kitchen glimepiride (AMARYL) 1 MG tablet Take 1 mg by mouth daily with breakfast.    .  hydrocortisone (CORTEF) 10 MG tablet Take 5-10 mg by mouth See admin instructions. Take 1 tablet (10 mg) by mouth in the morning & 1/2 tablet (5 mg) by mouth in the evening.    Marland Kitchen levothyroxine (SYNTHROID) 75 MCG tablet Take 75 mcg by mouth daily before breakfast.     . Lidocaine 4 % PTCH Apply 1 patch topically daily as needed (pain).    Marland Kitchen lidocaine-prilocaine (EMLA) cream Apply 1 application topically daily as needed (prior to port access). 30 g 1  . Nivolumab (OPDIVO IV) Inject 1 Dose into the vein every 14 (fourteen) days. [place]    . omeprazole (PRILOSEC) 20 MG capsule Take 1 capsule (20 mg total) by mouth daily. 60 capsule 3  . sitaGLIPtin (JANUVIA) 50 MG tablet Take 50 mg by mouth daily.     No current facility-administered medications for this visit.   Facility-Administered Medications Ordered in Other Visits  Medication Dose Route Frequency Provider Last Rate Last Admin  . sodium chloride flush (NS) 0.9 % injection 10 mL  10 mL Intracatheter PRN Truitt Merle, MD   10 mL at 11/20/18 1232    REVIEW OF SYSTEMS:   A 10+ POINT REVIEW OF SYSTEMS WAS OBTAINED including neurology, dermatology, psychiatry, cardiac, respiratory, lymph, extremities, GI, GU, Musculoskeletal, constitutional, breasts, reproductive, HEENT.  All pertinent positives are noted in the HPI.  All others are  negative.   PHYSICAL EXAMINATION:  ECOG FS:2 - Symptomatic, <50% confined to bed  Vitals:   01/19/20 0852  BP: 138/62  Pulse: 70  Resp: 18  Temp: (!) 97.3 F (36.3 C)  SpO2: 100%   Wt Readings from Last 3 Encounters:  01/19/20 134 lb 1.6 oz (60.8 kg)  12/22/19 135 lb 4 oz (61.3 kg)  12/08/19 132 lb 9.6 oz (60.1 kg)   Body mass index is 27.08 kg/m.    GENERAL:alert, in no acute distress and comfortable SKIN: no acute rashes, no significant lesions EYES: conjunctiva are pink and non-injected, sclera anicteric OROPHARYNX: MMM, no exudates, no oropharyngeal erythema or ulceration NECK: supple, no JVD LYMPH:  no palpable lymphadenopathy in the cervical, axillary or inguinal regions LUNGS: clear to auscultation b/l with normal respiratory effort HEART: regular rate & rhythm ABDOMEN:  normoactive bowel sounds , non tender, not distended. No palpable hepatosplenomegaly.  Extremity: no pedal edema PSYCH: alert & oriented x 3 with fluent speech NEURO: no focal motor/sensory deficits  LABORATORY DATA:  I have reviewed the data as listed  . CBC Latest Ref Rng & Units 01/19/2020 01/05/2020 12/22/2019  WBC 4.0 - 10.5 K/uL 9.5 9.2 9.6  Hemoglobin 12.0 - 15.0 g/dL 11.2(L) 11.9(L) 11.1(L)  Hematocrit 36 - 46 % 35.5(L) 37.5 35.1(L)  Platelets 150 - 400 K/uL 320 326 293    CBC    Component Value Date/Time   WBC 9.5 01/19/2020 0835   RBC 4.00 01/19/2020 0835   HGB 11.2 (L) 01/19/2020 0835   HGB 11.4 (L) 09/11/2018 0900   HGB 11.2 (L) 04/04/2017 0830   HCT 35.5 (L) 01/19/2020 0835   HCT 35.2 04/04/2017 0830   PLT 320 01/19/2020 0835   PLT 277 09/11/2018 0900   PLT 250 04/04/2017 0830   MCV 88.8 01/19/2020 0835   MCV 107.6 (H) 04/04/2017 0830   MCH 28.0 01/19/2020 0835   MCHC 31.5 01/19/2020 0835   RDW 14.3 01/19/2020 0835   RDW 14.0 04/04/2017 0830   LYMPHSABS 1.1 01/19/2020 0835   LYMPHSABS 1.6 04/04/2017 0830   MONOABS 1.0  01/19/2020 0835   MONOABS 0.4 04/04/2017 0830    EOSABS 0.2 01/19/2020 0835   EOSABS 0.1 04/04/2017 0830   BASOSABS 0.1 01/19/2020 0835   BASOSABS 0.0 04/04/2017 0830     . CMP Latest Ref Rng & Units 01/19/2020 01/12/2020 01/05/2020  Glucose 70 - 99 mg/dL 179(H) - 167(H)  BUN 8 - 23 mg/dL 28(H) - 19  Creatinine 0.44 - 1.00 mg/dL 1.87(H) - 1.61(H)  Sodium 135 - 145 mmol/L 140 - 135  Potassium 3.5 - 5.1 mmol/L 4.1 - 4.6  Chloride 98 - 111 mmol/L 112(H) - 102  CO2 22 - 32 mmol/L 22 - 25  Calcium 8.9 - 10.3 mg/dL 8.1(L) - 9.8  Total Protein 6.5 - 8.1 g/dL 6.7 7.3 6.9  Total Bilirubin 0.3 - 1.2 mg/dL 0.4 0.5 0.5  Alkaline Phos 38 - 126 U/L 89 87 134(H)  AST 15 - 41 U/L 23 26 98(H)  ALT 0 - 44 U/L 33 37 231(H)   B12 level 299 (OSH)-->455  . Lab Results  Component Value Date   LDH 117 12/22/2019        08/27/18 Surgical Pathology from Cholecystectomy:         RADIOGRAPHIC STUDIES: I have personally reviewed the radiological images as listed and agreed with the findings in the report. Nm Pet Image Restag (ps) Skull Base To Thigh  Result Date: 01/03/2017 IMPRESSION: 1. There is a new lesion in the hepatic dome which is FDG avid and low in attenuation on CT imaging consistent with metastatic disease. Another region of uptake in the left hepatic lobe posteriorly demonstrates no CT correlate. A second subtle metastasis is not excluded on today's study. An MRI could better assess for other hepatic metastases if clinically warranted. 2. New metastatic lesion in the left side of T8. 3. New pulmonary nodule in the right upper lobe. This is too small to characterize but suspicious. Recommend attention on follow-up. No FDG avid nodules or other enlarging nodules. 4. The uptake at the previous left renal artery has almost resolved in the interval and is favored to be post therapeutic. Recommend attention on follow-up. 5. The metastasis in the posterior right hilum seen previously has resolved. Electronically Signed   By: Dorise Bullion III  M.D   On: 01/03/2017 11:16   .DG Abd 2 Views  Result Date: 01/06/2020 CLINICAL DATA:  Bloating and constipation. EXAM: X-RAY ABDOMEN 2 VIEWS COMPARISON:  Abdominal x-ray dated September 23, 2018. FINDINGS: The bowel gas pattern is normal. There is no evidence of free air. Mildly increased colonic stool burden. No radio-opaque calculi or other significant radiographic abnormality is seen. Distal common bile duct stent noted. Prior cholecystectomy. IMPRESSION: 1. Mildly increased colonic stool burden. No acute findings. Electronically Signed   By: Titus Dubin M.D.   On: 01/06/2020 15:09    ASSESSMENT & PLAN:    79 y.o. 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 PET/CT 06/19/2017 -- consistent with partial metabolic response to treatment.  Rt adrenal gland bx - showed clear cell RCC  S/p CYtoreductive left radical nephrectomy and left adrenal gland resection on 08/01/2016 by Dr Alinda Money.  10/16/17 PET/CT revealed Continued improvement, with the T7 metastatic lesion no longer significantly hypermetabolic. The previous right lower lobe pulmonary nodule is even less apparent, perhaps about 2 mm in diameter today; given that this measured 6 mm on 03/28/2017 this probably  represents an effectively treated metastatic lesion. Currently no appreciable hypermetabolic activity is identified to suggest active malignancy. Distended gallbladder with gallbladder wall thickening and gallstones. Correlate clinically in assessing for cholecystitis. Small but abnormal amount of free pelvic fluid, nonspecific. Other imaging findings of potential clinical significance: Chronic ethmoid sinusitis. Aortic Atherosclerosis. Stable 5 mm right middle lobe pulmonary nodule, not hypermetabolic but below sensitive PET-CT size thresholds. Left nephrectomy. Notable pelvic floor laxity with cystocele. Chronic  bilateral Sacroiliitis.  04/03/18 PET/CT revealed No evidence for new or progressive hypermetabolic disease on today's study to suggest metastatic progression. 2. Stable appearance of the T7 metastatic lesion without Hypermetabolism. 3. Tiny focus of FDG accumulation identified along the skin of the low right inguinal fold. No associated lesion evident on CT. This may be related to urinary contaminant. 4. Cholelithiasis with similar appearance of diffuse gallbladder wall thickening. 5. Stable 5 mm right middle lobe pulmonary nodule. 6.  Aortic Atherosclerois. 7. Diffuse colonic diverticulosis.   09/18/18 CT A/P revealed "Common duct stent in place. Improvement to resolution in biliary duct dilatation compared to 08/29/2018. Interval cholecystectomy without acute complication. 2. Left nephrectomy, without evidence of metastatic disease. 3.  Tiny hiatal hernia. 4. Coronary artery atherosclerosis. Aortic Atherosclerosis. 5. Mild limitations secondary to lack of IV contrast. 6. Marked pelvic floor laxity with cystocele and rectal prolapse".  07/02/2019 PET/CT ([id number]) which revealed "1. No evidence of hypermetabolic metastatic disease."  #2 b/l adrenal metastases from [facility] s/p left adrenalectomy with adrenal insuff - follows with Dr Buddy Duty.  #3 Small pulmonary lesions -- 10/16/17 PET/CT showed pulmonary less apparent than 03/28/17 PET/CT, decreasing from 2mm to 13mm diameter.   MRI brain shows no evidence of metastatic disease  #4 T7/8 Bone metastases - received Xgeva every 4 weeks from May 2018 to June 2019. 10/16/17 PET/CT revealed improvement with T7 metastatic lesion no longer significantly hypermetabolic.  -on Marchelle Folks  #5 ?liver mets- rpt PET/CT from 10/16/2017 shows no overt evidence of metastatic disease in the liver.  #6 Grade 1 Nausea - improved and intermittent. Hasnt used her anti-emetic as instructed so some nausea and decreased po intake.  #7 Grade 1 Diarrhea - resolved  #8  Hyponatremia -  resolved with sodium at 136 - likely related to some element of adrenal insufficiency, diarrhea, limited by mouth intake. Primarily solute free fluid intake.   #9 Acute on chronic renal insufficiency Creatine stable 1.44  #10 Hyperkalemia due to ACEI + RF- resolved  #11 Hemorrhoids -chronic with some bleeding --Recommended Sitz bath and OTC Anusol or Nupercaine for her hemorrhoid relief. -f/u with PCP for continued mx   #12 Moderate protein calorie malnutrition Weight has stabilized and improved Wt Readings from Last 3 Encounters:  01/19/20 134 lb 1.6 oz (60.8 kg)  12/22/19 135 lb 4 oz (61.3 kg)  12/08/19 132 lb 9.6 oz (60.1 kg)   Plan: Continue healthy po intake/diabetic diet -Previously recommended the patient to drink atleast 48-64 oz of fluids daily -f/u with PCP/Cardiology for diuretics management  #13 Hypothyroidism/Adrenal insufficiency/Diabetes -Continue being followed by Dr. Buddy Duty  #14 HTN - ?control. Patient tends to be anxious and has higher blood pressures in the clinic. She can have increased blood pressure from Sutent as well. Plan: -continued close f/u with her PCP /cardiology regarding the many elements necessary for her care that are not directly related to her oncology care. -ACEI held due to AKI and hyperkalemia - following with cardiology to mx this. Has been started on Amlodipine instead.  #15 Grade  1 mucositis- resolved -Advised the patient to continue use of Magic Mouthwash to aid with nutrition  #16 Newly diagnosed ?CAD - positive cardiac nuclear stress test Following with cardiology and nephrology   #17 h/o Choledocholithiasis and cholelithiasis 07/10/18 US Abdomen revealed Biliary duct dilatation with 8 mm calculus at the level of the ampulla in the distal common bile duct. 2. Cholelithiasis. No gallbladder wall thickening or pericholecystic fluid. 3. Appearance of the liver raises concern for underlying hepatic cirrhosis. No focal liver  lesions are demonstrable. 4.  Left kidney absent. 5.  Small cysts in right kidney. 6.  Aortic Atherosclerosis.  S/p ERCP on 07/23/18 with Dr. Valarie Merino Mansouraty  09/02/18 Cytopathology from ERCP was suspicious for malignant cells in bile duct, but was not definitive, other two findings were benign.  PLAN:  -Discussed pt labwork today, 01/19/20; blood counts look okay, liver enzymes have normalized, blood chemistries reflect dehydration.  -Recommended that the pt continue to eat well, drink at least 48-64 oz of water each day, and walk 20-30 minutes each day.  -No lab or clinical evidence of progression of pt's Left Renal Cell Carcinoma at this time. -The pt has no prohibitive toxicities from continuing C48D1 of Nivolumab at this time. -Discussed CDC guidelines regarding the COVID19 booster.  -Recommend pt receive the annual flu vaccine. She will consider this.  -Recommend pt f/u with PCP for Diabetes management/neuropathy evaluation. -Recommend pt f/u with Dr. Rush Landmark as scheduled.  -Will get repeat PET/CT in 3 weeks  -Will see back in 4 weeks with labs   FOLLOW UP: PET/CT in 3 weeks Plz schedule next 4 Nivolumab infusions with portflush and labs MD visit in 4 weeks    The total time spent in the appt was 20 minutes and more than 50% was on counseling and direct patient cares.  All of the patient's questions were answered with apparent satisfaction. The patient knows to call the clinic with any problems, questions or concerns.   Sullivan Lone MD [place] AAHIVMS [facility] Hematology/Oncology Physician [facility]  (Office):       [phone] (Work cell):  [phone] (Fax):           [phone]  I, Yevette Edwards, am acting as a scribe for Dr. Sullivan Lone.   .I have reviewed the above documentation for accuracy and completeness, and I agree with the above. Brunetta Genera MD

## 2020-01-19 NOTE — Patient Instructions (Signed)

## 2020-01-19 NOTE — Progress Notes (Signed)
Per [name], ok to treat with Scr 1.87.

## 2020-01-19 NOTE — Progress Notes (Signed)
Patient denies shortness of breath, fever, cough or chest pain.  PCP/Cardiologist - Dr [name]  Endocrinology - Dr Buddy Duty Hematology - Dr Sullivan Lone  Chest x-ray - n/a EKG - 08/30/18  Stress Test - 03/2018 ECHO - 2020 Cardiac Cath - n/a   Fasting Blood Sugar - 150s  Checks Blood Sugar 1 times a day . Do not take oral diabetes medicines (glimepiride, januvia) the morning of surgery.  If your blood sugar is less than 70 mg/dL, you will need to treat for low blood sugar: o Treat a low blood sugar (less than 70 mg/dL) with  cup of clear juice (cranberry or apple), 4 glucose tablets, OR glucose gel. o Recheck blood sugar in 15 minutes after treatment (to make sure it is greater than 70 mg/dL). If your blood sugar is not greater than 70 mg/dL on recheck, call [phone] for further instructions.  Anesthesia review: Yes  STOP now taking any Aspirin (unless otherwise instructed by your surgeon), Aleve, Naproxen, Ibuprofen, Motrin, Advil, Goody's, BC's, all herbal medications, fish oil, and all vitamins.   Coronavirus Screening Covid test scheduled on 01/19/20 Do you have any of the following symptoms:  Cough yes/no: No Fever (>100.37F)  yes/no: No Runny nose yes/no: No Sore throat yes/no: No Difficulty breathing/shortness of breath  yes/no: No  Have you traveled in the last 14 days and where? yes/no: No  Patient verbalized understanding of instructions that were given via phone.

## 2020-01-19 NOTE — Patient Instructions (Signed)
[facility] Discharge Instructions for Patients Receiving Chemotherapy  Today you received the following chemotherapy agents Opdivo  To help prevent nausea and vomiting after your treatment, we encourage you to take your nausea medication as directed   If you develop nausea and vomiting that is not controlled by your nausea medication, call the clinic.   BELOW ARE SYMPTOMS THAT SHOULD BE REPORTED IMMEDIATELY:  *FEVER GREATER THAN 100.5 F  *CHILLS WITH OR WITHOUT FEVER  NAUSEA AND VOMITING THAT IS NOT CONTROLLED WITH YOUR NAUSEA MEDICATION  *UNUSUAL SHORTNESS OF BREATH  *UNUSUAL BRUISING OR BLEEDING  TENDERNESS IN MOUTH AND THROAT WITH OR WITHOUT PRESENCE OF ULCERS  *URINARY PROBLEMS  *BOWEL PROBLEMS  UNUSUAL RASH Items with * indicate a potential emergency and should be followed up as soon as possible.  Feel free to call the clinic should you have any questions or concerns. The clinic phone number is [phone].  Please show the CHEMO ALERT CARD at check-in to the Emergency Department and triage nurse.   

## 2020-01-20 ENCOUNTER — Other Ambulatory Visit (HOSPITAL_COMMUNITY): Payer: Medicare Other

## 2020-01-20 LAB — NOVEL CORONAVIRUS, NAA: SARS-CoV-2, NAA: NOT DETECTED

## 2020-01-20 LAB — SARS-COV-2, NAA 2 DAY TAT

## 2020-01-24 ENCOUNTER — Ambulatory Visit (HOSPITAL_COMMUNITY): Payer: Medicare Other | Admitting: Physician Assistant

## 2020-01-24 ENCOUNTER — Ambulatory Visit (HOSPITAL_COMMUNITY): Payer: Medicare Other

## 2020-01-24 ENCOUNTER — Encounter (HOSPITAL_COMMUNITY): Payer: Self-pay | Admitting: Gastroenterology

## 2020-01-24 ENCOUNTER — Encounter (HOSPITAL_COMMUNITY)
Admission: RE | Disposition: A | Discharge status: Home / Self Care | Payer: Self-pay | Source: Home / Self Care | Attending: Gastroenterology

## 2020-01-24 ENCOUNTER — Ambulatory Visit (HOSPITAL_COMMUNITY)
Admission: RE | Admit: 2020-01-24 | Discharge: 2020-01-24 | Disposition: A | Discharge status: Home / Self Care | Payer: Medicare Other | Attending: Gastroenterology | Admitting: Gastroenterology

## 2020-01-24 ENCOUNTER — Other Ambulatory Visit: Payer: Self-pay | Admitting: Physician Assistant

## 2020-01-24 ENCOUNTER — Other Ambulatory Visit: Payer: Self-pay

## 2020-01-24 DIAGNOSIS — Z889 Allergy status to unspecified drugs, medicaments and biological substances status: Secondary | ICD-10-CM | POA: Insufficient documentation

## 2020-01-24 DIAGNOSIS — R945 Abnormal results of liver function studies: Secondary | ICD-10-CM

## 2020-01-24 DIAGNOSIS — K811 Chronic cholecystitis: Secondary | ICD-10-CM | POA: Diagnosis not present

## 2020-01-24 DIAGNOSIS — I252 Old myocardial infarction: Secondary | ICD-10-CM | POA: Diagnosis not present

## 2020-01-24 DIAGNOSIS — M160 Bilateral primary osteoarthritis of hip: Secondary | ICD-10-CM | POA: Insufficient documentation

## 2020-01-24 DIAGNOSIS — Z887 Allergy status to serum and vaccine status: Secondary | ICD-10-CM | POA: Diagnosis not present

## 2020-01-24 DIAGNOSIS — K219 Gastro-esophageal reflux disease without esophagitis: Secondary | ICD-10-CM | POA: Insufficient documentation

## 2020-01-24 DIAGNOSIS — K2950 Unspecified chronic gastritis without bleeding: Secondary | ICD-10-CM | POA: Insufficient documentation

## 2020-01-24 DIAGNOSIS — M19041 Primary osteoarthritis, right hand: Secondary | ICD-10-CM | POA: Diagnosis not present

## 2020-01-24 DIAGNOSIS — Z8249 Family history of ischemic heart disease and other diseases of the circulatory system: Secondary | ICD-10-CM | POA: Diagnosis not present

## 2020-01-24 DIAGNOSIS — Z9889 Other specified postprocedural states: Secondary | ICD-10-CM

## 2020-01-24 DIAGNOSIS — Z886 Allergy status to analgesic agent status: Secondary | ICD-10-CM | POA: Insufficient documentation

## 2020-01-24 DIAGNOSIS — Z905 Acquired absence of kidney: Secondary | ICD-10-CM | POA: Insufficient documentation

## 2020-01-24 DIAGNOSIS — K831 Obstruction of bile duct: Secondary | ICD-10-CM | POA: Insufficient documentation

## 2020-01-24 DIAGNOSIS — I10 Essential (primary) hypertension: Secondary | ICD-10-CM | POA: Insufficient documentation

## 2020-01-24 DIAGNOSIS — M1990 Unspecified osteoarthritis, unspecified site: Secondary | ICD-10-CM | POA: Insufficient documentation

## 2020-01-24 DIAGNOSIS — Z9049 Acquired absence of other specified parts of digestive tract: Secondary | ICD-10-CM | POA: Insufficient documentation

## 2020-01-24 DIAGNOSIS — R7989 Other specified abnormal findings of blood chemistry: Secondary | ICD-10-CM

## 2020-01-24 DIAGNOSIS — M19042 Primary osteoarthritis, left hand: Secondary | ICD-10-CM | POA: Diagnosis not present

## 2020-01-24 DIAGNOSIS — E1136 Type 2 diabetes mellitus with diabetic cataract: Secondary | ICD-10-CM | POA: Insufficient documentation

## 2020-01-24 DIAGNOSIS — Z87891 Personal history of nicotine dependence: Secondary | ICD-10-CM | POA: Insufficient documentation

## 2020-01-24 DIAGNOSIS — K2289 Other specified disease of esophagus: Secondary | ICD-10-CM | POA: Insufficient documentation

## 2020-01-24 DIAGNOSIS — E785 Hyperlipidemia, unspecified: Secondary | ICD-10-CM | POA: Diagnosis not present

## 2020-01-24 DIAGNOSIS — Z85528 Personal history of other malignant neoplasm of kidney: Secondary | ICD-10-CM | POA: Diagnosis not present

## 2020-01-24 DIAGNOSIS — Z4659 Encounter for fitting and adjustment of other gastrointestinal appliance and device: Secondary | ICD-10-CM | POA: Diagnosis not present

## 2020-01-24 DIAGNOSIS — K449 Diaphragmatic hernia without obstruction or gangrene: Secondary | ICD-10-CM | POA: Diagnosis not present

## 2020-01-24 DIAGNOSIS — E039 Hypothyroidism, unspecified: Secondary | ICD-10-CM | POA: Insufficient documentation

## 2020-01-24 DIAGNOSIS — Z888 Allergy status to other drugs, medicaments and biological substances status: Secondary | ICD-10-CM | POA: Insufficient documentation

## 2020-01-24 DIAGNOSIS — Z4689 Encounter for fitting and adjustment of other specified devices: Secondary | ICD-10-CM

## 2020-01-24 HISTORY — PX: BIOPSY: SHX5522

## 2020-01-24 HISTORY — PX: REMOVAL OF STONES: SHX5545

## 2020-01-24 HISTORY — PX: ESOPHAGOGASTRODUODENOSCOPY: SHX5428

## 2020-01-24 HISTORY — PX: STENT REMOVAL: SHX6421

## 2020-01-24 HISTORY — PX: BILIARY DILATION: SHX6850

## 2020-01-24 HISTORY — PX: ENDOSCOPIC RETROGRADE CHOLANGIOPANCREATOGRAPHY (ERCP) WITH PROPOFOL: SHX5810

## 2020-01-24 LAB — GLUCOSE, CAPILLARY
Glucose-Capillary: 131 mg/dL — ABNORMAL HIGH (ref 70–99)
Glucose-Capillary: 278 mg/dL — ABNORMAL HIGH (ref 70–99)

## 2020-01-24 SURGERY — ENDOSCOPIC RETROGRADE CHOLANGIOPANCREATOGRAPHY (ERCP) WITH PROPOFOL
Anesthesia: General

## 2020-01-24 MED ORDER — SODIUM CHLORIDE (PF) 0.9 % IJ SOLN
INTRAMUSCULAR | Status: DC | PRN
  Administered 2020-01-24: 20 mL

## 2020-01-24 MED ORDER — CIPROFLOXACIN IN D5W 400 MG/200ML IV SOLN
INTRAVENOUS | Status: AC
  Filled 2020-01-24: qty 200

## 2020-01-24 MED ORDER — EPHEDRINE SULFATE-NACL 50-0.9 MG/10ML-% IV SOSY
PREFILLED_SYRINGE | INTRAVENOUS | Status: DC | PRN
  Administered 2020-01-24 (×3): 10 mg via INTRAVENOUS

## 2020-01-24 MED ORDER — CIPROFLOXACIN HCL 500 MG PO TABS: 500.0000 mg | ORAL_TABLET | Freq: Two times a day (BID) | ORAL | 0 refills | Status: AC

## 2020-01-24 MED ORDER — CIPROFLOXACIN IN D5W 400 MG/200ML IV SOLN
INTRAVENOUS | Status: DC | PRN
  Administered 2020-01-24: 400 mg via INTRAVENOUS

## 2020-01-24 MED ORDER — DEXAMETHASONE SODIUM PHOSPHATE 10 MG/ML IJ SOLN
INTRAMUSCULAR | Status: DC | PRN
  Administered 2020-01-24: 5 mg via INTRAVENOUS

## 2020-01-24 MED ORDER — FENTANYL CITRATE (PF) 250 MCG/5ML IJ SOLN
INTRAMUSCULAR | Status: DC | PRN
Start: 2020-01-24 — End: 2020-01-24
  Administered 2020-01-24 (×2): 50 ug via INTRAVENOUS

## 2020-01-24 MED ORDER — ONDANSETRON HCL 4 MG/2ML IJ SOLN
INTRAMUSCULAR | Status: DC | PRN
  Administered 2020-01-24: 4 mg via INTRAVENOUS

## 2020-01-24 MED ORDER — SODIUM CHLORIDE 0.9 % IV SOLN: INTRAVENOUS | Status: DC

## 2020-01-24 MED ORDER — LACTATED RINGERS IV SOLN: INTRAVENOUS | Status: DC | PRN

## 2020-01-24 MED ORDER — GLUCAGON HCL RDNA (DIAGNOSTIC) 1 MG IJ SOLR
INTRAMUSCULAR | Status: DC | PRN
  Administered 2020-01-24 (×2): .25 mg via INTRAVENOUS

## 2020-01-24 MED ORDER — PROPOFOL 10 MG/ML IV BOLUS
INTRAVENOUS | Status: DC | PRN
  Administered 2020-01-24: 100 mg via INTRAVENOUS

## 2020-01-24 MED ORDER — OMEPRAZOLE 20 MG PO CPDR
20.0000 mg | DELAYED_RELEASE_CAPSULE | Freq: Two times a day (BID) | ORAL | 3 refills | Status: DC
Start: 2020-01-24 — End: 2020-07-18

## 2020-01-24 MED ORDER — LIDOCAINE 2% (20 MG/ML) 5 ML SYRINGE
INTRAMUSCULAR | Status: DC | PRN
  Administered 2020-01-24: 40 mg via INTRAVENOUS

## 2020-01-24 MED ORDER — GLUCAGON HCL RDNA (DIAGNOSTIC) 1 MG IJ SOLR
INTRAMUSCULAR | Status: AC
  Filled 2020-01-24: qty 2

## 2020-01-24 MED ORDER — INDOMETHACIN 50 MG RE SUPP
RECTAL | Status: AC
  Filled 2020-01-24: qty 2

## 2020-01-24 MED ORDER — SUCCINYLCHOLINE CHLORIDE 200 MG/10ML IV SOSY
PREFILLED_SYRINGE | INTRAVENOUS | Status: DC | PRN
  Administered 2020-01-24: 60 mg via INTRAVENOUS

## 2020-01-24 NOTE — Anesthesia Preprocedure Evaluation (Addendum)
Anesthesia Evaluation  Patient identified by MRN, date of birth, ID band Patient awake    Reviewed: Allergy & Precautions, NPO status , Patient's Chart, lab work & pertinent test results  Airway Mallampati: II  TM Distance: >3 FB Neck ROM: Full    Dental  (+) Teeth Intact, Dental Advisory Given   Pulmonary former smoker,    breath sounds clear to auscultation       Cardiovascular hypertension,  Rhythm:Regular Rate:Normal     Neuro/Psych    GI/Hepatic   Endo/Other  diabetes  Renal/GU      Musculoskeletal   Abdominal   Peds  Hematology   Anesthesia Other Findings   Reproductive/Obstetrics                             Anesthesia Physical Anesthesia Plan  ASA: III  Anesthesia Plan: General   Post-op Pain Management:    Induction: Intravenous  PONV Risk Score and Plan: Ondansetron  Airway Management Planned: Oral ETT  Additional Equipment:   Intra-op Plan:   Post-operative Plan: Extubation in OR  Informed Consent: I have reviewed the patients History and Physical, chart, labs and discussed the procedure including the risks, benefits and alternatives for the proposed anesthesia with the patient or authorized representative who has indicated his/her understanding and acceptance.   Dental advisory given  Plan Discussed with: CRNA and Anesthesiologist  Anesthesia Plan Comments:         Anesthesia Quick Evaluation  

## 2020-01-24 NOTE — H&P (Signed)
GASTROENTEROLOGY PROCEDURE H&P NOTE   Primary Care Physician: [name], MD  HPI: Audrey [name] is a 79 y.o. female who presents for EGD/ERCP for evaluation of previously placed biliary stent for biliary stricture.  Patient also having significant abdominal pains and concern for PUD.  Past Medical History:  Diagnosis Date  . Anemia   . Arthritis    lower back, hips, hands  . Biliary stricture   . Diabetes mellitus ([place])    type 2   . Early cataracts, bilateral    Md just watching  . Elevated liver enzymes   . GERD (gastroesophageal reflux disease)    occasional - diet controlled  . History of blood transfusion 2018  . History of hiatal hernia   . HTN (hypertension)   . Hyperlipidemia   . Hypothyroidism   . left renal ca dx'd 2018   renal cancer - left kidney removed, pill chemo x 1 yr  . Myocardial infarction ([place]) 1991   no deficits  . SVD (spontaneous vaginal delivery)    x 3  . Wears glasses    Past Surgical History:  Procedure Laterality Date  . BALLOON DILATION N/A 07/23/2018   Procedure: BALLOON DILATION;  Surgeon: Rush Landmark Telford Nab., MD;  Location: [place];  Service: Gastroenterology;  Laterality: N/A;  . BILIARY BRUSHING  08/10/2018   Procedure: BILIARY BRUSHING;  Surgeon: Rush Landmark Telford Nab., MD;  Location: [place];  Service: Gastroenterology;;  . BILIARY BRUSHING  11/30/2018   Procedure: BILIARY BRUSHING;  Surgeon: Irving Copas., MD;  Location: [place];  Service: Gastroenterology;;  . BILIARY DILATION  08/10/2018   Procedure: BILIARY DILATION;  Surgeon: Irving Copas., MD;  Location: [place];  Service: Gastroenterology;;  . BILIARY DILATION  08/27/2018   Procedure: BILIARY DILATION;  Surgeon: Irving Copas., MD;  Location: [place];  Service: Gastroenterology;;  . BILIARY STENT PLACEMENT  08/10/2018   Procedure: BILIARY STENT PLACEMENT;  Surgeon: Irving Copas., MD;  Location: [place];   Service: Gastroenterology;;  . BILIARY STENT PLACEMENT  08/27/2018   Procedure: BILIARY STENT PLACEMENT;  Surgeon: Irving Copas., MD;  Location: [place];  Service: Gastroenterology;;  . BILIARY STENT PLACEMENT  11/30/2018   Procedure: BILIARY STENT PLACEMENT;  Surgeon: Irving Copas., MD;  Location: [place];  Service: Gastroenterology;;  . BIOPSY  07/23/2018   Procedure: BIOPSY;  Surgeon: Irving Copas., MD;  Location: [place];  Service: Gastroenterology;;  . CHOLECYSTECTOMY N/A 09/02/2018   Procedure: LAPAROSCOPIC CHOLECYSTECTOMY;  Surgeon: Stark Klein, MD;  Location: [place];  Service: General;  Laterality: N/A;  . COLONOSCOPY     normal   . ENDOSCOPIC RETROGRADE CHOLANGIOPANCREATOGRAPHY (ERCP) WITH PROPOFOL N/A 08/10/2018   Procedure: ENDOSCOPIC RETROGRADE CHOLANGIOPANCREATOGRAPHY (ERCP) WITH PROPOFOL;  Surgeon: Irving Copas., MD;  Location: [place];  Service: Gastroenterology;  Laterality: N/A;  . ENDOSCOPIC RETROGRADE CHOLANGIOPANCREATOGRAPHY (ERCP) WITH PROPOFOL N/A 11/30/2018   Procedure: ENDOSCOPIC RETROGRADE CHOLANGIOPANCREATOGRAPHY (ERCP) WITH PROPOFOL;  Surgeon: Rush Landmark Telford Nab., MD;  Location: [place];  Service: Gastroenterology;  Laterality: N/A;  . ERCP N/A 07/23/2018   Procedure: ENDOSCOPIC RETROGRADE CHOLANGIOPANCREATOGRAPHY (ERCP);  Surgeon: Irving Copas., MD;  Location: [place];  Service: Gastroenterology;  Laterality: N/A;  . ERCP N/A 08/27/2018   Procedure: ENDOSCOPIC RETROGRADE CHOLANGIOPANCREATOGRAPHY (ERCP);  Surgeon: Irving Copas., MD;  Location: [place];  Service: Gastroenterology;  Laterality: N/A;  . ESOPHAGOGASTRODUODENOSCOPY (EGD) WITH PROPOFOL N/A 08/27/2018   Procedure: ESOPHAGOGASTRODUODENOSCOPY (EGD) WITH PROPOFOL;  Surgeon: Justice Britain  Brooke Bonito., MD;  Location: [place];  Service: Gastroenterology;  Laterality: N/A;  . EUS N/A 08/27/2018   Procedure: ESOPHAGEAL ENDOSCOPIC  ULTRASOUND (EUS) RADIAL;  Surgeon: Rush Landmark Telford Nab., MD;  Location: [place];  Service: Gastroenterology;  Laterality: N/A;  . FINE NEEDLE ASPIRATION  08/27/2018   Procedure: FINE NEEDLE ASPIRATION (FNA) LINEAR;  Surgeon: Irving Copas., MD;  Location: [place];  Service: Gastroenterology;;  . IR IMAGING GUIDED PORT INSERTION  01/08/2018  . LAPAROSCOPIC NEPHRECTOMY Left 08/01/2016   Procedure: LAPAROSCOPIC  RADICAL NEPHRECTOMY/ REPAIR OF UMBILICAL HERNIA;  Surgeon: Raynelle Bring, MD;  Location: [facility];  Service: Urology;  Laterality: Left;  . REMOVAL OF STONES  07/23/2018   Procedure: REMOVAL OF GALL STONES;  Surgeon: Rush Landmark Telford Nab., MD;  Location: [place];  Service: Gastroenterology;;  . REMOVAL OF STONES  08/10/2018   Procedure: REMOVAL OF STONES;  Surgeon: Irving Copas., MD;  Location: [place];  Service: Gastroenterology;;  . REMOVAL OF STONES  08/27/2018   Procedure: REMOVAL OF STONES;  Surgeon: Irving Copas., MD;  Location: [place];  Service: Gastroenterology;;  . [name]  07/23/2018   Procedure: [name];  Surgeon: Irving Copas., MD;  Location: [place];  Service: Gastroenterology;;  . Lavell Islam REMOVAL  08/27/2018   Procedure: STENT REMOVAL;  Surgeon: Irving Copas., MD;  Location: [place];  Service: Gastroenterology;;  . Lavell Islam REMOVAL  11/30/2018   Procedure: STENT REMOVAL;  Surgeon: Irving Copas., MD;  Location: [place];  Service: Gastroenterology;;  . UPPER GI ENDOSCOPY     x 1  . VAGINAL PROLAPSE REPAIR  11/18/2019   [facility]   No current facility-administered medications for this encounter.   Facility-Administered Medications Ordered in Other Encounters  Medication Dose Route Frequency Provider Last Rate Last Admin  . sodium chloride flush (NS) 0.9 % injection 10 mL  10 mL Intracatheter PRN Truitt Merle, MD   10 mL at 11/20/18 1232   Allergies  Allergen Reactions  .  Adhesive [Tape] Other (See Comments)    Tears skin off Paper tape is ok per patient  . Nsaids Other (See Comments)    Pt only has one kidney   . Statins Other (See Comments)    Liver/kidney issues.   Family History  Problem Relation Age of Onset  . Heart attack Father 13  . Heart disease Brother 5       CABG  . Colon cancer Neg Hx   . Esophageal cancer Neg Hx   . Inflammatory bowel disease Neg Hx   . Liver disease Neg Hx   . Pancreatic cancer Neg Hx   . Rectal cancer Neg Hx   . Stomach cancer Neg Hx    Social History   Socioeconomic History  . Marital status: Married    Spouse name: Not on file  . Number of children: 3  . Years of education: Not on file  . Highest education level: Not on file  Occupational History  . Occupation: retired  Tobacco Use  . Smoking status: Former Smoker    Packs/day: 1.00    Years: 8.00    Pack years: 8.00    Types: Cigarettes    Quit date: 06/18/1964    Years since quitting: 55.6  . Smokeless tobacco: Never Used  Vaping Use  . Vaping Use: Never used  Substance and Sexual Activity  . Alcohol use: No  . Drug use: No  . Sexual activity: Not on file  Other Topics Concern  .  Not on file  Social History Narrative   Married.  Three children   Social Determinants of Health   Financial Resource Strain:   . Difficulty of Paying Living Expenses: Not on file  Food Insecurity:   . Worried About Charity fundraiser in the Last Year: Not on file  . Ran Out of Food in the Last Year: Not on file  Transportation Needs:   . Lack of Transportation (Medical): Not on file  . Lack of Transportation (Non-Medical): Not on file  Physical Activity:   . Days of Exercise per Week: Not on file  . Minutes of Exercise per Session: Not on file  Stress:   . Feeling of Stress : Not on file  Social Connections:   . Frequency of Communication with Friends and Family: Not on file  . Frequency of Social Gatherings with Friends and Family: Not on file  .  Attends Religious Services: Not on file  . Active Member of Clubs or Organizations: Not on file  . Attends Archivist Meetings: Not on file  . Marital Status: Not on file  Intimate Partner Violence:   . Fear of Current or Ex-Partner: Not on file  . Emotionally Abused: Not on file  . Physically Abused: Not on file  . Sexually Abused: Not on file    Physical Exam: Vital signs in last 24 hours:     GEN: NAD EYE: Sclerae anicteric ENT: MMM CV: Non-tachycardic GI: Soft, tenderness to palpation throughout the abdomen NEURO:  Alert & Oriented x 3  Lab Results: No results for input(s): WBC, HGB, HCT, PLT in the last 72 hours. BMET No results for input(s): NA, K, CL, CO2, GLUCOSE, BUN, CREATININE, CALCIUM in the last 72 hours. LFT No results for input(s): PROT, ALBUMIN, AST, ALT, ALKPHOS, BILITOT, BILIDIR, IBILI in the last 72 hours. PT/INR No results for input(s): LABPROT, INR in the last 72 hours.   Impression / Plan: This is a 79 y.o.female who presents for EGD/ERCP for evaluation of previously placed biliary stent for biliary stricture.  Patient also having significant abdominal pains and concern for PUD.  The risks of an ERCP were discussed at length, including but not limited to the risk of perforation, bleeding, abdominal pain, post-ERCP pancreatitis (while usually mild can be severe and even life threatening).  The risks and benefits of endoscopic evaluation were discussed with the patient; these include but are not limited to the risk of perforation, infection, bleeding, missed lesions, lack of diagnosis, severe illness requiring hospitalization, as well as anesthesia and sedation related illnesses.  The patient is agreeable to proceed.    Justice Britain, MD [facility] Office # [phone]

## 2020-01-24 NOTE — Anesthesia Procedure Notes (Signed)
Procedure Name: Intubation Date/Time: 01/24/2020 9:29 AM Performed by: Kathryne Hitch, CRNA Pre-anesthesia Checklist: Patient identified, Emergency Drugs available, Suction available and Patient being monitored Patient Re-evaluated:Patient Re-evaluated prior to induction Oxygen Delivery Method: Circle system utilized Preoxygenation: Pre-oxygenation with 100% oxygen Induction Type: IV induction Ventilation: Mask ventilation without difficulty Laryngoscope Size: Miller and 3 Grade View: Grade II Tube type: Oral Tube size: 7.0 mm Number of attempts: 1 Airway Equipment and Method: Stylet and Oral airway Placement Confirmation: ETT inserted through vocal cords under direct vision,  positive ETCO2 and breath sounds checked- equal and bilateral Secured at: 21 cm Tube secured with: Tape Dental Injury: Teeth and Oropharynx as per pre-operative assessment

## 2020-01-24 NOTE — Discharge Instructions (Signed)
YOU HAD AN ENDOSCOPIC PROCEDURE TODAY: Refer to the procedure report and other information in the discharge instructions given to you for any specific questions about what was found during the examination. If this information does not answer your questions, please call [place] office at [phone] to clarify.   YOU SHOULD EXPECT: Some feelings of bloating in the abdomen. Passage of more gas than usual. Walking can help get rid of the air that was put into your GI tract during the procedure and reduce the bloating.  DIET: Your first meal following the procedure should be a light meal and then it is ok to progress to your normal diet. A half-sandwich or bowl of soup is an example of a good first meal. Heavy or fried foods are harder to digest and may make you feel nauseous or bloated. Drink plenty of fluids but you should avoid alcoholic beverages for 24 hours.  ACTIVITY: Your care partner should take you home directly after the procedure. You should plan to take it easy, moving slowly for the rest of the day. You can resume normal activity the day after the procedure however YOU SHOULD NOT DRIVE, use power tools, machinery or perform tasks that involve climbing or major physical exertion for 24 hours (because of the sedation medicines used during the test).   SYMPTOMS TO REPORT IMMEDIATELY: A gastroenterologist can be reached at any hour. Please call [phone]  for any of the following symptoms:   . Following upper endoscopy (EGD, EUS, ERCP, esophageal dilation) Vomiting of blood or coffee ground material  New, significant abdominal pain  New, significant chest pain or pain under the shoulder blades  Painful or persistently difficult swallowing  New shortness of breath 0ext 1-3 weeks. Call [phone]  if you have not heard about the biopsies in 3 weeks.  Please also call with any specific questions about appointments or follow up tests.

## 2020-01-24 NOTE — Transfer of Care (Signed)
Immediate Anesthesia Transfer of Care Note  Patient: Audrey [name]  Procedure(s) Performed: ENDOSCOPIC RETROGRADE CHOLANGIOPANCREATOGRAPHY (ERCP) WITH PROPOFOL (N/A ) BIOPSY ESOPHAGOGASTRODUODENOSCOPY (EGD) (N/A ) REMOVAL OF STONES BILIARY DILATION STENT REMOVAL  Patient Location: PACU  Anesthesia Type:General  Level of Consciousness: drowsy and patient cooperative  Airway & Oxygen Therapy: Patient Spontanous Breathing  Post-op Assessment: Report given to RN and Post -op Vital signs reviewed and stable  Post vital signs: Reviewed and stable  Last Vitals:  Vitals Value Taken Time  BP 137/55 01/24/20 1036  Temp    Pulse 71 01/24/20 1036  Resp 15 01/24/20 1036  SpO2 95 % 01/24/20 1036  Vitals shown include unvalidated device data.  Last Pain:  Vitals:   01/24/20 0814  TempSrc: Oral  PainSc: 0-No pain         Complications: No complications documented.

## 2020-01-24 NOTE — Op Note (Signed)
[facility] Patient Name: Audrey [name] Procedure Date : 01/24/2020 MRN: [id number] Attending MD: Justice Britain , MD Date of Birth: October 28, 1940 CSN: [id number] Age: 79 Admit Type: Outpatient Procedure:                ERCP Indications:              Common bile duct stricture, Abnormal liver function                            test, Biliary stent removal, Postop exam:                            Cholecystectomy, Prior Endoscopic Retrograde                            Cholangiopancreatography, Epigastric abdominal                            pain, Lower abdominal pain, Upper abdominal pain Providers:                Justice Britain, MD, Burtis Junes, RN, Elspeth Cho Tech., Technician Referring MD:             Carleene Cooper, MD Medicines:                Monitored Anesthesia Care, General Anesthesia,                            Cipro 400 mg IV, Glucagon 0.5 mg IV Complications:            No immediate complications. Estimated Blood Loss:     Estimated blood loss was minimal. Procedure:                Pre-Anesthesia Assessment:                           - Prior to the procedure, a History and Physical                            was performed, and patient medications and                            allergies were reviewed. The patient's tolerance of                            previous anesthesia was also reviewed. The risks                            and benefits of the procedure and the sedation                            options and risks were discussed with the patient.  All questions were answered, and informed consent                            was obtained. Prior Anticoagulants: The patient has                            taken no previous anticoagulant or antiplatelet                            agents. ASA Grade Assessment: III - A patient with                            severe systemic disease. After  reviewing the risks                            and benefits, the patient was deemed in                            satisfactory condition to undergo the procedure.                           After obtaining informed consent, the scope was                            passed under direct vision. Throughout the                            procedure, the patient's blood pressure, pulse, and                            oxygen saturations were monitored continuously. The                            [id number]-H190 ([id number]) Olympus gastroscope was                            introduced through the mouth, and used to inject                            contrast into and used to locate the major papilla.                            The Duodenoscope was introduced through the mouth,                            and used to inject contrast into and used to inject                            contrast into the bile duct. The ERCP was                            accomplished without difficulty. The patient  tolerated the procedure. Scope In: Scope Out: Findings:      A scout film of the abdomen was obtained. Surgical clips, consistent       with previous cholecystectomy, were seen in the area of the right upper       quadrant of the abdomen. One metal stent ending in the lower third of       the main bile duct was seen.      Initially a standard esophagogastroduodenoscopy scope was used for the       examination of the upper gastrointestinal tract. The scope was passed       under direct vision through the upper GI tract. No gross lesions were       noted in the proximal esophagus and in the mid esophagus. [place] of       salmon-colored mucosa were present from 36 to 37 cm - this was biopsied       to rule out Barrett's. The Z-line was regular and was found 37 cm from       the incisors. Patchy mildly erythematous mucosa without bleeding was       found in the gastric body. No other gross  lesions were noted in the       entire examined stomach - biopsies obtained to rule out HP and for       histology purposes. No gross lesions were noted in the duodenal bulb.       Using the duodenoscope, one covered metal biliary stent originating in       the biliary tree was emerging from the major papilla. The stent was       partially occluded by tissue ingrowth distally as well as what looked to       be some sludge. The major papilla itslef was edematous and partially       ulcerated and congested - query result of the biliary stent being in       place, though biopsies obtained to rule out adenomatous change      I decided to leave the biliary stent in place initially. A short 0.035       inch Soft Jagwire was passed into the biliary tree. The short-nosed       traction sphincterotome was passed over the guidewire and the bile duct       was then deeply cannulated. Contrast was injected. I personally       interpreted the bile duct images. Ductal flow of contrast was adequate.       Image quality was adequate. Contrast extended to the hepatic ducts.       Opacification of the entire biliary tree except for the cystic duct and       gallbladder was successful. The main bile duct was mildly dilated. The       largest diameter was 10 mm. The lower third of the main bile duct       contained filling defects thought to be sludge. To discover objects, the       biliary tree was swept with a retrieval balloon starting distally and       then moving towards the bifurcation sequentially. Significant amounts of       sludge were swept from the duct. I then decided to see how the region       looked without a biliary stent. The stent was removed from the biliary       tree using a  Raptor grasping device and sent for histology purposes as       there was evidence of tissue ingrowth within the stent. A short 0.035       inch Soft Jagwire was passed into the biliary tree. The short-nosed        traction sphincterotome was passed over the guidewire and the bile duct       was then deeply cannulated. Contrast was injected. To discover objects,       the biliary tree was swept with a retrieval balloon moving sequentially       to the bifurcation. Sludge was swept from the duct. An occlusion       cholangiogram was performed that showed no further significant biliary       pathology. At the distal aspect of the ampulla as a result of the       congestion/swelling from the previous stent, biliary drainage was felt       to be adequate. But to ensure in the setting of not having a stent in       place, I proceeded with a dilation of the distal common bile duct with       an 11-28-08 mm balloon (to a maximum balloon size of 10 mm) dilator which       was successful. To discover objects, the biliary tree was swept with a       retrieval balloon starting at the bifurcation. Nothing was found.       Biliary drainage was then excellent and all contrast was removed from       the biliary tree.      A pancreatogram was not performed.      The duodenoscope was withdrawn from the patient. Impression:               - No gross lesions in esophagus proximally.                            Salmon-colored mucosa suspicious for short-segment                            Barrett's esophagus - biopsied. Z-line regular, 37                            cm from the incisors.                           - Erythematous mucosa in the gastric body. No other                            gross lesions in the stomach.                           - No gross lesions in the duodenal bulb.                           - One partially occluded stent from the biliary                            tree was seen at the major papilla with evidence of  tissue ingrowth (even though this was a fully                            covered metal stent) and some biliary sludge.                           - The major papilla  appeared edematous and slightly                            ulcerated - query from biliary stent being in                            place. Biopsied to rule out ampullary adenomatous                            tissue.                           - The fluoroscopic examination was suspicious for                            sludge.                           - The entire main bile duct was mildly dilated.                           - The biliary tree was swept and sludge was found.                           - One covered metal stent was removed from the                            biliary tree.                           - The biliary tree was swept and sludge was found.                           - Distal CBD was successfully dilated to ensure                            adequate drainage.                           - The biliary tree was swept and nothing was found. Recommendation:           - The patient will be observed post-procedure,                            until all discharge criteria are met.                           - Discharge patient to home.                           -  Patient has a contact number available for                            emergencies. The signs and symptoms of potential                            delayed complications were discussed with the                            patient. Return to normal activities tomorrow.                            Written discharge instructions were provided to the                            patient.                           - Low fat diet for 1 week.                           - Observe patient's clinical course.                           - Check liver enzymes (AST, ALT, alkaline                            phosphatase, bilirubin) in 2 weeks.                           - Cipro (ciprofloxacin) 500 mg PO BID for 3 days.                           - Watch for pancreatitis, bleeding, perforation,                            and cholangitis.                            - The findings and recommendations were discussed                            with the patient.                           - The findings and recommendations were discussed                            with the patient's family. Procedure Code(s):        --- Professional ---                           [phone], Endoscopic retrograde                            cholangiopancreatography (ERCP); with removal of  foreign body(s) or stent(s) from biliary/pancreatic                            duct(s)                           [id number], Endoscopic retrograde                            cholangiopancreatography (ERCP); with removal of                            calculi/debris from biliary/pancreatic duct(s) Diagnosis Code(s):        --- Professional ---                           K22.8, Other specified diseases of esophagus                           K31.89, Other diseases of stomach and duodenum                           T85.590A, Other mechanical complication of bile                            duct prosthesis, initial encounter                           K83.8, Other specified diseases of biliary tract                           Z46.59, Encounter for fitting and adjustment of                            other gastrointestinal appliance and device                           K83.1, Obstruction of bile duct                           R94.5, Abnormal results of liver function studies                           Z09, Encounter for follow-up examination after                            completed treatment for conditions other than                            malignant neoplasm                           Z90.49, Acquired absence of other specified parts                            of digestive tract  Z98.890, Other specified postprocedural states                           R10.13, Epigastric pain                           R10.30, Lower abdominal pain,  unspecified                           R10.10, Upper abdominal pain, unspecified CPT copyright 2019 [employer]. All rights reserved. The codes documented in this report are preliminary and upon coder review may  be revised to meet current compliance requirements. Justice Britain, MD 01/24/2020 10:47:20 AM Number of Addenda: 0

## 2020-01-24 NOTE — Anesthesia Postprocedure Evaluation (Signed)
Anesthesia Post Note  Patient: Audrey [name]  Procedure(s) Performed: ENDOSCOPIC RETROGRADE CHOLANGIOPANCREATOGRAPHY (ERCP) WITH PROPOFOL (N/A ) BIOPSY ESOPHAGOGASTRODUODENOSCOPY (EGD) (N/A ) REMOVAL OF STONES BILIARY DILATION STENT REMOVAL     Patient location during evaluation: PACU Anesthesia Type: General Level of consciousness: sedated Pain management: pain level controlled Vital Signs Assessment: post-procedure vital signs reviewed and stable Respiratory status: spontaneous breathing and respiratory function stable Cardiovascular status: stable Postop Assessment: no apparent nausea or vomiting Anesthetic complications: no   No complications documented.  Last Vitals:  Vitals:   01/24/20 1051 01/24/20 1106  BP: (!) 127/54 132/62  Pulse: 69 68  Resp: 13 16  Temp:  36.7 C  SpO2: 96% 99%    Last Pain:  Vitals:   01/24/20 1106  TempSrc:   PainSc: 0-No pain                 Merlinda Frederick

## 2020-01-25 ENCOUNTER — Telehealth: Payer: Self-pay | Admitting: Hematology

## 2020-01-25 LAB — SURGICAL PATHOLOGY

## 2020-01-25 NOTE — Telephone Encounter (Signed)
Scheduled per los, patient has been called and notified. 

## 2020-01-26 ENCOUNTER — Encounter (HOSPITAL_COMMUNITY): Payer: Self-pay | Admitting: Gastroenterology

## 2020-01-27 ENCOUNTER — Encounter: Payer: Self-pay | Admitting: Gastroenterology

## 2020-02-02 ENCOUNTER — Inpatient Hospital Stay: Payer: Medicare Other

## 2020-02-02 ENCOUNTER — Other Ambulatory Visit: Payer: Self-pay

## 2020-02-02 ENCOUNTER — Inpatient Hospital Stay: Payer: Medicare Other | Attending: Hematology

## 2020-02-02 VITALS — BP 136/92 | HR 70 | Temp 97.7°F | Resp 16 | Wt 132.5 lb

## 2020-02-02 DIAGNOSIS — C7971 Secondary malignant neoplasm of right adrenal gland: Secondary | ICD-10-CM | POA: Insufficient documentation

## 2020-02-02 DIAGNOSIS — E119 Type 2 diabetes mellitus without complications: Secondary | ICD-10-CM | POA: Insufficient documentation

## 2020-02-02 DIAGNOSIS — I252 Old myocardial infarction: Secondary | ICD-10-CM | POA: Insufficient documentation

## 2020-02-02 DIAGNOSIS — E039 Hypothyroidism, unspecified: Secondary | ICD-10-CM | POA: Diagnosis not present

## 2020-02-02 DIAGNOSIS — K429 Umbilical hernia without obstruction or gangrene: Secondary | ICD-10-CM | POA: Insufficient documentation

## 2020-02-02 DIAGNOSIS — C642 Malignant neoplasm of left kidney, except renal pelvis: Secondary | ICD-10-CM

## 2020-02-02 DIAGNOSIS — E871 Hypo-osmolality and hyponatremia: Secondary | ICD-10-CM | POA: Diagnosis not present

## 2020-02-02 DIAGNOSIS — I10 Essential (primary) hypertension: Secondary | ICD-10-CM | POA: Insufficient documentation

## 2020-02-02 DIAGNOSIS — E538 Deficiency of other specified B group vitamins: Secondary | ICD-10-CM | POA: Diagnosis not present

## 2020-02-02 DIAGNOSIS — M129 Arthropathy, unspecified: Secondary | ICD-10-CM | POA: Insufficient documentation

## 2020-02-02 DIAGNOSIS — C7800 Secondary malignant neoplasm of unspecified lung: Secondary | ICD-10-CM | POA: Insufficient documentation

## 2020-02-02 DIAGNOSIS — K219 Gastro-esophageal reflux disease without esophagitis: Secondary | ICD-10-CM | POA: Insufficient documentation

## 2020-02-02 DIAGNOSIS — D649 Anemia, unspecified: Secondary | ICD-10-CM | POA: Insufficient documentation

## 2020-02-02 DIAGNOSIS — Z87891 Personal history of nicotine dependence: Secondary | ICD-10-CM | POA: Diagnosis not present

## 2020-02-02 DIAGNOSIS — C7972 Secondary malignant neoplasm of left adrenal gland: Secondary | ICD-10-CM | POA: Insufficient documentation

## 2020-02-02 DIAGNOSIS — Z95828 Presence of other vascular implants and grafts: Secondary | ICD-10-CM

## 2020-02-02 DIAGNOSIS — Z79899 Other long term (current) drug therapy: Secondary | ICD-10-CM | POA: Diagnosis not present

## 2020-02-02 DIAGNOSIS — C7951 Secondary malignant neoplasm of bone: Secondary | ICD-10-CM | POA: Diagnosis not present

## 2020-02-02 DIAGNOSIS — Z5112 Encounter for antineoplastic immunotherapy: Secondary | ICD-10-CM | POA: Insufficient documentation

## 2020-02-02 DIAGNOSIS — E785 Hyperlipidemia, unspecified: Secondary | ICD-10-CM | POA: Insufficient documentation

## 2020-02-02 DIAGNOSIS — Z7189 Other specified counseling: Secondary | ICD-10-CM

## 2020-02-02 LAB — CMP (CANCER CENTER ONLY)
ALT: 31 U/L (ref 0–44)
AST: 25 U/L (ref 15–41)
Albumin: 3.2 g/dL — ABNORMAL LOW (ref 3.5–5.0)
Alkaline Phosphatase: 76 U/L (ref 38–126)
Anion gap: 7 (ref 5–15)
BUN: 28 mg/dL — ABNORMAL HIGH (ref 8–23)
CO2: 20 mmol/L — ABNORMAL LOW (ref 22–32)
Calcium: 8.8 mg/dL — ABNORMAL LOW (ref 8.9–10.3)
Chloride: 114 mmol/L — ABNORMAL HIGH (ref 98–111)
Creatinine: 2.43 mg/dL — ABNORMAL HIGH (ref 0.44–1.00)
GFR, Estimated: 19 mL/min — ABNORMAL LOW (ref >60)
Glucose, Bld: 165 mg/dL — ABNORMAL HIGH (ref 70–99)
Potassium: 3.6 mmol/L (ref 3.5–5.1)
Sodium: 141 mmol/L (ref 135–145)
Total Bilirubin: 0.3 mg/dL (ref 0.3–1.2)
Total Protein: 6.5 g/dL (ref 6.5–8.1)

## 2020-02-02 LAB — CBC WITH DIFFERENTIAL/PLATELET
Abs Immature Granulocytes: 0.12 10*3/uL — ABNORMAL HIGH (ref 0.00–0.07)
Basophils Absolute: 0.1 10*3/uL (ref 0.0–0.1)
Basophils Relative: 1 %
Eosinophils Absolute: 0.3 10*3/uL (ref 0.0–0.5)
Eosinophils Relative: 3 %
HCT: 34 % — ABNORMAL LOW (ref 36.0–46.0)
Hemoglobin: 10.7 g/dL — ABNORMAL LOW (ref 12.0–15.0)
Immature Granulocytes: 1 %
Lymphocytes Relative: 12 %
Lymphs Abs: 1.2 10*3/uL (ref 0.7–4.0)
MCH: 27.6 pg (ref 26.0–34.0)
MCHC: 31.5 g/dL (ref 30.0–36.0)
MCV: 87.9 fL (ref 80.0–100.0)
Monocytes Absolute: 1 10*3/uL (ref 0.1–1.0)
Monocytes Relative: 10 %
Neutro Abs: 7.4 10*3/uL (ref 1.7–7.7)
Neutrophils Relative %: 73 %
Platelets: 278 10*3/uL (ref 150–400)
RBC: 3.87 MIL/uL (ref 3.87–5.11)
RDW: 14.6 % (ref 11.5–15.5)
WBC: 10 10*3/uL (ref 4.0–10.5)
nRBC: 0 % (ref 0.0–0.2)

## 2020-02-02 LAB — TSH: TSH: 1.022 u[IU]/mL (ref 0.308–3.960)

## 2020-02-02 MED ORDER — SODIUM CHLORIDE 0.9 % IV SOLN
Freq: Once | INTRAVENOUS | Status: AC
  Filled 2020-02-02: qty 250

## 2020-02-02 MED ORDER — SODIUM CHLORIDE 0.9% FLUSH
10.0000 mL | INTRAVENOUS | Status: DC | PRN
  Administered 2020-02-02: 10 mL
  Filled 2020-02-02: qty 10

## 2020-02-02 MED ORDER — SODIUM CHLORIDE 0.9 % IV SOLN
240.0000 mg | Freq: Once | INTRAVENOUS | Status: AC
  Administered 2020-02-02: 240 mg via INTRAVENOUS
  Filled 2020-02-02: qty 24

## 2020-02-02 MED ORDER — HEPARIN SOD (PORK) LOCK FLUSH 100 UNIT/ML IV SOLN
500.0000 [IU] | Freq: Once | INTRAVENOUS | Status: AC | PRN
  Administered 2020-02-02: 500 [IU]
  Filled 2020-02-02: qty 5

## 2020-02-02 NOTE — Progress Notes (Signed)
Per [name], ok to treat with Scr 2.43.

## 2020-02-02 NOTE — Patient Instructions (Signed)
[facility] Discharge Instructions for Patients Receiving Chemotherapy  Today you received the following chemotherapy agents: Opdivo.  To help prevent nausea and vomiting after your treatment, we encourage you to take your nausea medication as directed.   If you develop nausea and vomiting that is not controlled by your nausea medication, call the clinic.   BELOW ARE SYMPTOMS THAT SHOULD BE REPORTED IMMEDIATELY:  *FEVER GREATER THAN 100.5 F  *CHILLS WITH OR WITHOUT FEVER  NAUSEA AND VOMITING THAT IS NOT CONTROLLED WITH YOUR NAUSEA MEDICATION  *UNUSUAL SHORTNESS OF BREATH  *UNUSUAL BRUISING OR BLEEDING  TENDERNESS IN MOUTH AND THROAT WITH OR WITHOUT PRESENCE OF ULCERS  *URINARY PROBLEMS  *BOWEL PROBLEMS  UNUSUAL RASH Items with * indicate a potential emergency and should be followed up as soon as possible.  Feel free to call the clinic should you have any questions or concerns. The clinic phone number is [phone].  Please show the [facility] at check-in to the Emergency Department and triage nurse.  Denosumab injection What is this medicine? DENOSUMAB (den oh sue mab) slows bone breakdown. Prolia is used to treat osteoporosis in women after menopause and in men, and in people who are taking corticosteroids for 6 months or more. Delton See is used to treat a high calcium level due to cancer and to prevent bone fractures and other bone problems caused by multiple myeloma or cancer bone metastases. Delton See is also used to treat giant cell tumor of the bone. This medicine may be used for other purposes; ask your health care provider or pharmacist if you have questions. COMMON BRAND NAME(S): Prolia, XGEVA What should I tell my health care provider before I take this medicine? They need to know if you have any of these conditions:  dental disease  having surgery or tooth extraction  infection  kidney disease  low levels of calcium or Vitamin D in the  blood  malnutrition  on hemodialysis  skin conditions or sensitivity  thyroid or parathyroid disease  an unusual reaction to denosumab, other medicines, foods, dyes, or preservatives  pregnant or trying to get pregnant  breast-feeding How should I use this medicine? This medicine is for injection under the skin. It is given by a health care professional in a hospital or clinic setting. A special MedGuide will be given to you before each treatment. Be sure to read this information carefully each time. For Prolia, talk to your pediatrician regarding the use of this medicine in children. Special care may be needed. For Delton See, talk to your pediatrician regarding the use of this medicine in children. While this drug may be prescribed for children as young as 13 years for selected conditions, precautions do apply. Overdosage: If you think you have taken too much of this medicine contact a poison control center or emergency room at once. NOTE: This medicine is only for you. Do not share this medicine with others. What if I miss a dose? It is important not to miss your dose. Call your doctor or health care professional if you are unable to keep an appointment. What may interact with this medicine? Do not take this medicine with any of the following medications:  other medicines containing denosumab This medicine may also interact with the following medications:  medicines that lower your chance of fighting infection  steroid medicines like prednisone or cortisone This list may not describe all possible interactions. Give your health care provider a list of all the medicines, herbs, non-prescription  drugs, or dietary supplements you use. Also tell them if you smoke, drink alcohol, or use illegal drugs. Some items may interact with your medicine. What should I watch for while using this medicine? Visit your doctor or health care professional for regular checks on your progress. Your doctor or  health care professional may order blood tests and other tests to see how you are doing. Call your doctor or health care professional for advice if you get a fever, chills or sore throat, or other symptoms of a cold or flu. Do not treat yourself. This drug may decrease your body's ability to fight infection. Try to avoid being around people who are sick. You should make sure you get enough calcium and vitamin D while you are taking this medicine, unless your doctor tells you not to. Discuss the foods you eat and the vitamins you take with your health care professional. See your dentist regularly. Brush and floss your teeth as directed. Before you have any dental work done, tell your dentist you are receiving this medicine. Do not become pregnant while taking this medicine or for 5 months after stopping it. Talk with your doctor or health care professional about your birth control options while taking this medicine. Women should inform their doctor if they wish to become pregnant or think they might be pregnant. There is a potential for serious side effects to an unborn child. Talk to your health care professional or pharmacist for more information. What side effects may I notice from receiving this medicine? Side effects that you should report to your doctor or health care professional as soon as possible:  allergic reactions like skin rash, itching or hives, swelling of the face, lips, or tongue  bone pain  breathing problems  dizziness  jaw pain, especially after dental work  redness, blistering, peeling of the skin  signs and symptoms of infection like fever or chills; cough; sore throat; pain or trouble passing urine  signs of low calcium like fast heartbeat, muscle cramps or muscle pain; pain, tingling, numbness in the hands or feet; seizures  unusual bleeding or bruising  unusually weak or tired Side effects that usually do not require medical attention (report to your doctor or  health care professional if they continue or are bothersome):  constipation  diarrhea  headache  joint pain  loss of appetite  muscle pain  runny nose  tiredness  upset stomach This list may not describe all possible side effects. Call your doctor for medical advice about side effects. You may report side effects to FDA at 1-800-FDA-1088. Where should I keep my medicine? This medicine is only given in a clinic, doctor's office, or other health care setting and will not be stored at home. NOTE: This sheet is a summary. It may not cover all possible information. If you have questions about this medicine, talk to your doctor, pharmacist, or health care provider.  2020 Elsevier/Gold Standard (2017-08-15 16:10:44)

## 2020-02-09 ENCOUNTER — Ambulatory Visit (HOSPITAL_COMMUNITY)
Admission: RE | Admit: 2020-02-09 | Discharge: 2020-02-09 | Disposition: A | Discharge status: Home / Self Care | Payer: Medicare Other | Source: Ambulatory Visit | Attending: Hematology | Admitting: Hematology

## 2020-02-09 ENCOUNTER — Other Ambulatory Visit: Payer: Self-pay

## 2020-02-09 DIAGNOSIS — C642 Malignant neoplasm of left kidney, except renal pelvis: Secondary | ICD-10-CM | POA: Insufficient documentation

## 2020-02-09 DIAGNOSIS — C7951 Secondary malignant neoplasm of bone: Secondary | ICD-10-CM | POA: Diagnosis present

## 2020-02-09 DIAGNOSIS — Z5112 Encounter for antineoplastic immunotherapy: Secondary | ICD-10-CM | POA: Diagnosis present

## 2020-02-09 DIAGNOSIS — Z905 Acquired absence of kidney: Secondary | ICD-10-CM | POA: Diagnosis not present

## 2020-02-09 LAB — GLUCOSE, CAPILLARY: Glucose-Capillary: 136 mg/dL — ABNORMAL HIGH (ref 70–99)

## 2020-02-09 MED ORDER — FLUDEOXYGLUCOSE F - 18 (FDG) INJECTION
6.5000 | Freq: Once | INTRAVENOUS | Status: AC | PRN
  Administered 2020-02-09: 6.5 via INTRAVENOUS

## 2020-02-13 NOTE — Progress Notes (Signed)
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 She is here for C50D1 of Nivolumab. The patient's last visit with [name] was on 01/19/2020. The pt reports that she is doing well overall.  The pt reports no acute new symptoms. No fevers no chills no night sweats. No new abdominal pain. Chronic issues with umbilical hernia which she shall discuss with Dr. Barry Dienes and her cardiologist regarding possibility of surgical intervention.  Notes that she has been doing better after her surgery for uterine prolapse.  No prohibitive toxicities from her nivolumab. No skin rashes no diarrhea no other acute new symptoms.  Recently completed her ERCP with Dr. Rush Landmark.  Of note since the patient's last visit, pt has had PET/CT ([id number]) completed on 02/09/2020 with results revealing "1. No evidence of renal cell carcinoma recurrence or metastasis. 2. No interval change from comparison PET-CT scan 07/02/2019. 3. Post LEFT nephrectomy."  Lab results today (02/13/20) of CBC w/diff and CMP -reviewed with patient    MEDICAL HISTORY:   Past Medical History:  Diagnosis Date  . Anemia   . Arthritis    lower back, hips, hands  . Biliary stricture   . Diabetes mellitus ([place])    type 2   . Early cataracts, bilateral    Md just watching  . Elevated liver enzymes   . GERD (gastroesophageal reflux disease)    occasional - diet controlled  . History of blood transfusion 2018  . History of hiatal hernia   . HTN (hypertension)   . Hyperlipidemia   .  Hypothyroidism   . left renal ca dx'd 2018   renal cancer - left kidney removed, pill chemo x 1 yr  . Myocardial infarction ([place]) 1991   no deficits  . SVD (spontaneous vaginal delivery)    x 3  . Wears glasses    Dyslipidemia Osteoarthritis Ex-smoker Coronary artery disease Thyroid disorder-was apparently on levothyroxine 25 g daily which has subsequently been discontinued . Mitral regurgitation B12 deficiency  hiatal hernia with esophagitis   SURGICAL HISTORY: Past Surgical History:  Procedure Laterality Date  . BALLOON DILATION N/A 07/23/2018   Procedure: BALLOON DILATION;  Surgeon: Rush Landmark Telford Nab., MD;  Location: [place];  Service: Gastroenterology;  Laterality: N/A;  . BILIARY BRUSHING  08/10/2018   Procedure: BILIARY BRUSHING;  Surgeon: Rush Landmark Telford Nab., MD;  Location: [place];  Service: Gastroenterology;;  . BILIARY BRUSHING  11/30/2018   Procedure: BILIARY BRUSHING;  Surgeon: Irving Copas., MD;  Location: [facility];  Service: Gastroenterology;;  . BILIARY DILATION  08/10/2018   Procedure: BILIARY DILATION;  Surgeon: Irving Copas., MD;  Location: [place];  Service: Gastroenterology;;  . BILIARY DILATION  08/27/2018   Procedure: BILIARY DILATION;  Surgeon: Irving Copas., MD;  Location: [place];  Service: Gastroenterology;;  . BILIARY DILATION  01/24/2020   Procedure: BILIARY DILATION;  Surgeon: Irving Copas., MD;  Location: [place];  Service: Gastroenterology;;  . BILIARY STENT PLACEMENT  08/10/2018   Procedure: BILIARY STENT PLACEMENT;  Surgeon: Irving Copas., MD;  Location: [facility];  Service: Gastroenterology;;  . BILIARY STENT PLACEMENT  08/27/2018   Procedure: BILIARY STENT PLACEMENT;  Surgeon: Rush Landmark Telford Nab., MD;  Location: [place];  Service: Gastroenterology;;  . BILIARY STENT PLACEMENT  11/30/2018   Procedure: BILIARY STENT PLACEMENT;  Surgeon: Irving Copas., MD;  Location: [place];  Service: Gastroenterology;;  . BIOPSY  07/23/2018   Procedure: BIOPSY;  Surgeon: Irving Copas., MD;  Location: [place];  Service: Gastroenterology;;  . BIOPSY  01/24/2020   Procedure: BIOPSY;  Surgeon: Irving Copas., MD;  Location: [place];  Service: Gastroenterology;;  . CHOLECYSTECTOMY N/A 09/02/2018   Procedure: LAPAROSCOPIC CHOLECYSTECTOMY;  Surgeon: Stark Klein, MD;  Location: [place];  Service: General;  Laterality: N/A;  . COLONOSCOPY     normal   . ENDOSCOPIC RETROGRADE CHOLANGIOPANCREATOGRAPHY (ERCP) WITH PROPOFOL N/A 08/10/2018   Procedure: ENDOSCOPIC RETROGRADE CHOLANGIOPANCREATOGRAPHY (ERCP) WITH PROPOFOL;  Surgeon: Irving Copas., MD;  Location: [place];  Service: Gastroenterology;  Laterality: N/A;  . ENDOSCOPIC RETROGRADE CHOLANGIOPANCREATOGRAPHY (ERCP) WITH PROPOFOL N/A 11/30/2018   Procedure: ENDOSCOPIC RETROGRADE CHOLANGIOPANCREATOGRAPHY (ERCP) WITH PROPOFOL;  Surgeon: Rush Landmark Telford Nab., MD;  Location: [place];  Service: Gastroenterology;  Laterality: N/A;  . ENDOSCOPIC RETROGRADE CHOLANGIOPANCREATOGRAPHY (ERCP) WITH PROPOFOL N/A 01/24/2020   Procedure: ENDOSCOPIC RETROGRADE CHOLANGIOPANCREATOGRAPHY (ERCP) WITH PROPOFOL;  Surgeon: Rush Landmark Telford Nab., MD;  Location: [place];  Service: Gastroenterology;  Laterality: N/A;  . ERCP N/A 07/23/2018   Procedure: ENDOSCOPIC RETROGRADE CHOLANGIOPANCREATOGRAPHY (ERCP);  Surgeon: Irving Copas., MD;  Location: [place];  Service: Gastroenterology;  Laterality: N/A;  . ERCP N/A 08/27/2018   Procedure: ENDOSCOPIC RETROGRADE CHOLANGIOPANCREATOGRAPHY (ERCP);  Surgeon: Irving Copas., MD;  Location: [place];  Service: Gastroenterology;  Laterality: N/A;  . ESOPHAGOGASTRODUODENOSCOPY N/A 01/24/2020   Procedure: ESOPHAGOGASTRODUODENOSCOPY (EGD);  Surgeon: Irving Copas., MD;  Location: [place];  Service:  Gastroenterology;  Laterality: N/A;  . ESOPHAGOGASTRODUODENOSCOPY (EGD) WITH PROPOFOL N/A 08/27/2018   Procedure: ESOPHAGOGASTRODUODENOSCOPY (EGD) WITH PROPOFOL;  Surgeon: Rush Landmark Telford Nab., MD;  Location: [place];  Service: Gastroenterology;  Laterality: N/A;  . EUS N/A 08/27/2018   Procedure: ESOPHAGEAL ENDOSCOPIC ULTRASOUND (EUS) RADIAL;  Surgeon: Rush Landmark Telford Nab., MD;  Location: [place];  Service: Gastroenterology;  Laterality: N/A;  . FINE NEEDLE ASPIRATION  08/27/2018   Procedure: FINE NEEDLE ASPIRATION (FNA) LINEAR;  Surgeon: Irving Copas., MD;  Location: [place];  Service: Gastroenterology;;  . IR IMAGING GUIDED PORT INSERTION  01/08/2018  . LAPAROSCOPIC NEPHRECTOMY Left 08/01/2016   Procedure: LAPAROSCOPIC  RADICAL NEPHRECTOMY/ REPAIR OF UMBILICAL HERNIA;  Surgeon: Raynelle Bring, MD;  Location: [facility];  Service: Urology;  Laterality: Left;  . REMOVAL OF STONES  07/23/2018   Procedure: REMOVAL OF GALL STONES;  Surgeon: Rush Landmark Telford Nab., MD;  Location: [place];  Service: Gastroenterology;;  . REMOVAL OF STONES  08/10/2018   Procedure: REMOVAL OF STONES;  Surgeon: Irving Copas., MD;  Location: [place];  Service: Gastroenterology;;  . REMOVAL OF STONES  08/27/2018   Procedure: REMOVAL OF STONES;  Surgeon: Irving Copas., MD;  Location: [place];  Service: Gastroenterology;;  . REMOVAL OF STONES  01/24/2020   Procedure: REMOVAL OF STONES;  Surgeon: Irving Copas., MD;  Location: [place];  Service: Gastroenterology;;  . [name]  07/23/2018   Procedure: [name];  Surgeon: Irving Copas., MD;  Location: [place];  Service: Gastroenterology;;  . Lavell Islam REMOVAL  08/27/2018   Procedure: STENT REMOVAL;  Surgeon: Irving Copas., MD;  Location:  [facility];  Service: Gastroenterology;;  . Lavell Islam REMOVAL  11/30/2018   Procedure: STENT REMOVAL;  Surgeon: Irving Copas., MD;  Location:  [place];  Service: Gastroenterology;;  . Lavell Islam REMOVAL  01/24/2020   Procedure: STENT REMOVAL;  Surgeon: Irving Copas., MD;  Location: [place];  Service: Gastroenterology;;  . UPPER GI ENDOSCOPY     x 1  . VAGINAL PROLAPSE REPAIR  11/18/2019   [facility]   No reported past surgeries EGD 05/01/2016 Dr. Earley Brooke Colonoscopy 03/2016 Dr. Earley Brooke  SOCIAL HISTORY: Social History   Socioeconomic History  . Marital status: Married    Spouse name: Not on file  . Number of children: 3  . Years of education: Not on file  . Highest education level: Not on file  Occupational History  . Occupation: retired  Tobacco Use  . Smoking status: Former Smoker    Packs/day: 1.00    Years: 8.00    Pack years: 8.00    Types: Cigarettes    Quit date: 06/18/1964    Years since quitting: 55.6  . Smokeless tobacco: Never Used  Vaping Use  . Vaping Use: Never used  Substance and Sexual Activity  . Alcohol use: No  . Drug use: No  . Sexual activity: Not on file  Other Topics Concern  . Not on file  Social History Narrative   Married.  Three children   Social Determinants of Health   Financial Resource Strain:   . Difficulty of Paying Living Expenses: Not on file  Food Insecurity:   . Worried About Charity fundraiser in the Last Year: Not on file  . Ran Out of Food in the Last Year: Not on file  Transportation Needs:   . Lack of Transportation (Medical): Not on file  . Lack of Transportation (Non-Medical): Not on file  Physical Activity:   . Days of Exercise per Week: Not on file  . Minutes of Exercise per Session: Not on file  Stress:   . Feeling of Stress : Not on file  Social Connections:   . Frequency of Communication with Friends and Family: Not on file  . Frequency of Social Gatherings with Friends and Family: Not on file  . Attends Religious Services: Not on file  . Active Member of Clubs or Organizations: Not on file  . Attends Archivist Meetings: Not  on file  . Marital Status: Not on file  Intimate Partner Violence:   . Fear of Current or Ex-Partner: Not on file  . Emotionally Abused: Not on file  . Physically Abused: Not on file  . Sexually Abused: Not on file   Patient lives in [place] Works as a Solicitor part-time Ex-smoker quit long time ago In 1961 . Smoked 2 packs per day for 8 years prior to that .  or a lot of stress since her daughter is also getting treated for stage IV uterine cancer.  FAMILY HISTORY:  Mother deceased Father with asthma and emphysema died at 72 years with the MI. Brother deceased of heart disease   ALLERGIES:  is allergic to adhesive [tape], nsaids, and statins.  MEDICATIONS:  Current Outpatient Medications  Medication Sig Dispense Refill  . amLODipine (NORVASC) 5 MG tablet Take 5 mg by mouth daily.   6  . atorvastatin (LIPITOR) 80 MG tablet Take 80 mg by mouth at bedtime.     . carvedilol (COREG) 6.25 MG tablet Take 6.25 mg by mouth 2 (two) times daily.  6  . cephALEXin (KEFLEX) 250 MG capsule Take 250 mg by mouth every Monday, Wednesday, and Friday.     . clobetasol ointment (TEMOVATE) 4.09 % Apply 1 application topically 2 (two) times daily as needed (lichen sclerosis).     . Denosumab (XGEVA [place]) Inject into the skin every 6 (six) weeks.    . diphenhydrAMINE (BENADRYL) 25 MG tablet Take 25 mg by mouth at bedtime as needed for sleep.    Marland Kitchen gabapentin (NEURONTIN) 100 MG capsule Take 100 mg by mouth 3 (three) times daily.    Marland Kitchen glimepiride (AMARYL) 1 MG tablet Take 1 mg by mouth daily with breakfast.    . hydrocortisone (CORTEF) 10 MG tablet Take 5-10 mg by mouth See admin instructions. Take 1 tablet (10 mg) by mouth in the morning & 1/2 tablet (5 mg) by mouth in the evening.    Marland Kitchen levothyroxine (SYNTHROID) 75 MCG tablet Take 75 mcg by mouth daily before breakfast.     . Lidocaine 4 % PTCH Apply 1 patch topically daily as needed (pain).    Marland Kitchen lidocaine-prilocaine (EMLA) cream Apply 1  application topically daily as needed (prior to port access). 30 g 1  . Nivolumab (OPDIVO IV) Inject 1 Dose into the vein every 14 (fourteen) days. [place]    . omeprazole (PRILOSEC) 20 MG capsule Take 1 capsule (20 mg total) by mouth 2 (two) times daily before a meal. 60 capsule 3  . sitaGLIPtin (JANUVIA) 50 MG tablet Take 50 mg by mouth daily.     No current facility-administered medications for this visit.   Facility-Administered Medications Ordered in Other Visits  Medication Dose Route Frequency Provider Last Rate Last Admin  . sodium chloride flush (NS) 0.9 % injection 10 mL  10 mL Intracatheter PRN Truitt Merle, MD   10 mL at 11/20/18 1232    REVIEW OF SYSTEMS:   A 10+ POINT REVIEW OF SYSTEMS WAS OBTAINED including neurology, dermatology, psychiatry, cardiac, respiratory, lymph, extremities, GI, GU, Musculoskeletal, constitutional, breasts, reproductive, HEENT.  All pertinent positives are noted in the HPI.  All others are negative.   PHYSICAL EXAMINATION:  ECOG FS:2 - Symptomatic, <50% confined to bed  There were no vitals filed for this visit. Wt Readings from Last 3 Encounters:  02/02/20 132 lb 8 oz (60.1 kg)  01/24/20 134 lb (60.8 kg)  01/19/20 134 lb 1.6 oz (60.8 kg)   There is no height or weight on file to calculate BMI.    NAD GENERAL:alert, in no acute distress and comfortable SKIN: no acute rashes, no significant lesions EYES: conjunctiva are pink and non-injected, sclera anicteric OROPHARYNX: MMM, no exudates, no oropharyngeal erythema or ulceration NECK: supple, no JVD LYMPH:  no palpable lymphadenopathy in the cervical, axillary or inguinal regions LUNGS: clear to auscultation b/l with normal respiratory effort HEART: regular rate & rhythm ABDOMEN:  normoactive bowel sounds , non tender, not distended. No palpable hepatosplenomegaly.  Extremity: no pedal edema PSYCH: alert & oriented x 3 with fluent speech NEURO: no focal motor/sensory  deficits  LABORATORY DATA:  I have reviewed the data as listed  . CBC Latest Ref Rng & Units 02/16/2020 02/02/2020 01/19/2020  WBC 4.0 - 10.5 K/uL 10.0 10.0 9.5  Hemoglobin 12.0 - 15.0 g/dL 10.7(L) 10.7(L) 11.2(L)  Hematocrit 36 - 46 % 34.3(L) 34.0(L) 35.5(L)  Platelets 150 - 400 K/uL 279 278 320    CBC    Component Value Date/Time   WBC 10.0 02/02/2020 0848   RBC  3.87 02/02/2020 0848   HGB 10.7 (L) 02/02/2020 0848   HGB 11.4 (L) 09/11/2018 0900   HGB 11.2 (L) 04/04/2017 0830   HCT 34.0 (L) 02/02/2020 0848   HCT 35.2 04/04/2017 0830   PLT 278 02/02/2020 0848   PLT 277 09/11/2018 0900   PLT 250 04/04/2017 0830   MCV 87.9 02/02/2020 0848   MCV 107.6 (H) 04/04/2017 0830   MCH 27.6 02/02/2020 0848   MCHC 31.5 02/02/2020 0848   RDW 14.6 02/02/2020 0848   RDW 14.0 04/04/2017 0830   LYMPHSABS 1.2 02/02/2020 0848   LYMPHSABS 1.6 04/04/2017 0830   MONOABS 1.0 02/02/2020 0848   MONOABS 0.4 04/04/2017 0830   EOSABS 0.3 02/02/2020 0848   EOSABS 0.1 04/04/2017 0830   BASOSABS 0.1 02/02/2020 0848   BASOSABS 0.0 04/04/2017 0830     . CMP Latest Ref Rng & Units 02/02/2020 01/19/2020 01/12/2020  Glucose 70 - 99 mg/dL 165(H) 179(H) -  BUN 8 - 23 mg/dL 28(H) 28(H) -  Creatinine 0.44 - 1.00 mg/dL 2.43(H) 1.87(H) -  Sodium 135 - 145 mmol/L 141 140 -  Potassium 3.5 - 5.1 mmol/L 3.6 4.1 -  Chloride 98 - 111 mmol/L 114(H) 112(H) -  CO2 22 - 32 mmol/L 20(L) 22 -  Calcium 8.9 - 10.3 mg/dL 8.8(L) 8.1(L) -  Total Protein 6.5 - 8.1 g/dL 6.5 6.7 7.3  Total Bilirubin 0.3 - 1.2 mg/dL 0.3 0.4 0.5  Alkaline Phos 38 - 126 U/L 76 89 87  AST 15 - 41 U/L 25 23 26   ALT 0 - 44 U/L 31 33 37   B12 level 299 (OSH)-->455  . Lab Results  Component Value Date   LDH 117 12/22/2019        08/27/18 Surgical Pathology from Cholecystectomy:         RADIOGRAPHIC STUDIES: I have personally reviewed the radiological images as listed and agreed with the findings in the report. Nm Pet Image  Restag (ps) Skull Base To Thigh  Result Date: 01/03/2017 IMPRESSION: 1. There is a new lesion in the hepatic dome which is FDG avid and low in attenuation on CT imaging consistent with metastatic disease. Another region of uptake in the left hepatic lobe posteriorly demonstrates no CT correlate. A second subtle metastasis is not excluded on today's study. An MRI could better assess for other hepatic metastases if clinically warranted. 2. New metastatic lesion in the left side of T8. 3. New pulmonary nodule in the right upper lobe. This is too small to characterize but suspicious. Recommend attention on follow-up. No FDG avid nodules or other enlarging nodules. 4. The uptake at the previous left renal artery has almost resolved in the interval and is favored to be post therapeutic. Recommend attention on follow-up. 5. The metastasis in the posterior right hilum seen previously has resolved. Electronically Signed   By: Dorise Bullion III M.D   On: 01/03/2017 11:16   .NM PET Image Restag (PS) Skull Base To Thigh  Result Date: 02/09/2020 CLINICAL DATA:  Subsequent treatment strategy for renal cell carcinoma. EXAM: NUCLEAR MEDICINE PET SKULL BASE TO THIGH TECHNIQUE: 6.5 mCi F-18 FDG was injected intravenously. Full-ring PET imaging was performed from the skull base to thigh after the radiotracer. CT data was obtained and used for attenuation correction and anatomic localization. Fasting blood glucose: 136 mg/dl COMPARISON:  PET-CT scan 07/02/2019 FINDINGS: Mediastinal blood pool activity: SUV max 3.2 Liver activity: SUV max 3.1 NECK: No hypermetabolic lymph nodes in the neck. Incidental CT  findings: Port in the anterior chest wall with tip in distal SVC. CHEST: No hypermetabolic mediastinal or hilar nodes. No suspicious pulmonary nodules on the CT scan. Incidental CT findings: Coronary artery calcification and aortic atherosclerotic calcification. ABDOMEN/PELVIS: Post LEFT nephrectomy. No nodularity or  hypermetabolic activity nephrectomy bed. No evidence of metastatic adenopathy in the abdomen pelvis. Incidental CT findings: RIGHT kidney appears normal. Biliary stent noted. Atherosclerotic calcification of the aorta. SKELETON: No focal hypermetabolic activity to suggest skeletal metastasis. Incidental CT findings: Degenerative changes of the thoracic and lumbar spine. IMPRESSION: 1. No evidence of renal cell carcinoma recurrence or metastasis. 2. No interval change from comparison PET-CT scan 07/02/2019. 3. Post LEFT nephrectomy. Electronically Signed   By: Suzy Bouchard M.D.   On: 02/09/2020 16:47   DG ERCP  Result Date: 01/24/2020 CLINICAL DATA:  79 year old female with a history of common bile duct stricture EXAM: ERCP TECHNIQUE: Multiple spot images obtained with the fluoroscopic device and submitted for interpretation post-procedure. FLUOROSCOPY TIME:  Fluoroscopy Time:  1 minute 12 seconds COMPARISON:  Prior ERCP 11/30/2018 FINDINGS: Total of 9 intraoperative saved images are submitted for review. The images demonstrate a flexible duodenal scope in the descending duodenum with a previously placed metal biliary stent. Surgical clips right upper quadrant are consistent prior cholecystectomy. Subsequent images demonstrate removal of the prior stent, cannulation of the common bile duct, cholangiogram and balloon sweeping of the common duct. IMPRESSION: ERCP as above. These images were submitted for radiologic interpretation only. Please see the procedural report for the amount of contrast and the fluoroscopy time utilized. Electronically Signed   By: Jacqulynn Cadet M.D.   On: 01/24/2020 11:25    ASSESSMENT & PLAN:    79 y.o. 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 PET/CT 06/19/2017 -- consistent with partial metabolic response to treatment.  Rt adrenal gland  bx - showed clear cell RCC  S/p CYtoreductive left radical nephrectomy and left adrenal gland resection on 08/01/2016 by Dr Alinda Money.  10/16/17 PET/CT revealed Continued improvement, with the T7 metastatic lesion no longer significantly hypermetabolic. The previous right lower lobe pulmonary nodule is even less apparent, perhaps about 2 mm in diameter today; given that this measured 6 mm on 03/28/2017 this probably represents an effectively treated metastatic lesion. Currently no appreciable hypermetabolic activity is identified to suggest active malignancy. Distended gallbladder with gallbladder wall thickening and gallstones. Correlate clinically in assessing for cholecystitis. Small but abnormal amount of free pelvic fluid, nonspecific. Other imaging findings of potential clinical significance: Chronic ethmoid sinusitis. Aortic Atherosclerosis. Stable 5 mm right middle lobe pulmonary nodule, not hypermetabolic but below sensitive PET-CT size thresholds. Left nephrectomy. Notable pelvic floor laxity with cystocele. Chronic bilateral Sacroiliitis.  04/03/18 PET/CT revealed No evidence for new or progressive hypermetabolic disease on today's study to suggest metastatic progression. 2. Stable appearance of the T7 metastatic lesion without Hypermetabolism. 3. Tiny focus of FDG accumulation identified along the skin of the low right inguinal fold. No associated lesion evident on CT. This may be related to urinary contaminant. 4. Cholelithiasis with similar appearance of diffuse gallbladder wall thickening. 5. Stable 5 mm right middle lobe pulmonary nodule. 6.  Aortic Atherosclerois. 7. Diffuse colonic diverticulosis.   09/18/18 CT A/P revealed "Common duct stent in place. Improvement to resolution in biliary duct dilatation compared to 08/29/2018. Interval cholecystectomy without acute complication. 2. Left nephrectomy, without evidence of metastatic disease.  3.  Tiny hiatal hernia. 4. Coronary artery  atherosclerosis. Aortic Atherosclerosis. 5. Mild limitations secondary to lack of IV contrast. 6. Marked pelvic floor laxity with cystocele and rectal prolapse".  07/02/2019 PET/CT ([id number]) which revealed "1. 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  #3 Small pulmonary lesions -- 10/16/17 PET/CT showed pulmonary less apparent than 03/28/17 PET/CT, decreasing from 70mm to 31mm diameter.   MRI brain shows no evidence of metastatic disease  #4 T7/8 Bone metastases - received Xgeva every 4 weeks from May 2018 to June 2019. 10/16/17 PET/CT revealed improvement with T7 metastatic lesion no longer significantly hypermetabolic.  -on Marchelle Folks  #5 ?liver mets- rpt PET/CT from 10/16/2017 shows no overt evidence of metastatic disease in the liver.  #6 Grade 1 Nausea - improved and intermittent. Hasnt used her anti-emetic as instructed so some nausea and decreased po intake.  #7 Grade 1 Diarrhea - resolved  #8 Hyponatremia -  resolved with sodium at 136 - likely related to some element of adrenal insufficiency, diarrhea, limited by mouth intake. Primarily solute free fluid intake.   #9 Acute on chronic renal insufficiency Creatine stable 1.44  #10 Hyperkalemia due to ACEI + RF- resolved  #11 Hemorrhoids -chronic with some bleeding --Recommended Sitz bath and OTC Anusol or Nupercaine for her hemorrhoid relief. -f/u with PCP for continued mx   #12 Moderate protein calorie malnutrition Weight has stabilized and improved Wt Readings from Last 3 Encounters:  02/02/20 132 lb 8 oz (60.1 kg)  01/24/20 134 lb (60.8 kg)  01/19/20 134 lb 1.6 oz (60.8 kg)   Plan: Continue healthy po intake/diabetic diet -Previously recommended the patient to drink atleast 48-64 oz of fluids daily -f/u with PCP/Cardiology for diuretics management  #13 Hypothyroidism/Adrenal  insufficiency/Diabetes -Continue being followed by Dr. Buddy Duty  #14 HTN - ?control. Patient tends to be anxious and has higher blood pressures in the clinic. She can have increased blood pressure from Sutent as well. Plan: -continued close f/u with her PCP /cardiology regarding the many elements necessary for her care that are not directly related to her oncology care. -ACEI held due to AKI and hyperkalemia - following with cardiology to mx this. Has been started on Amlodipine instead.  #15 Grade 1 mucositis- resolved -Advised the patient to continue use of Magic Mouthwash to aid with nutrition  #16 Newly diagnosed ?CAD - positive cardiac nuclear stress test Following with cardiology and nephrology   #17 h/o Choledocholithiasis and cholelithiasis 07/10/18 US Abdomen revealed Biliary duct dilatation with 8 mm calculus at the level of the ampulla in the distal common bile duct. 2. Cholelithiasis. No gallbladder wall thickening or pericholecystic fluid. 3. Appearance of the liver raises concern for underlying hepatic cirrhosis. No focal liver lesions are demonstrable. 4.  Left kidney absent. 5.  Small cysts in right kidney. 6.  Aortic Atherosclerosis.  S/p ERCP on 07/23/18 with Dr. Valarie Merino Mansouraty  09/02/18 Cytopathology from [facility] was suspicious for malignant cells in bile duct, but was not definitive, other two findings were benign.  PLAN:  -Patient's labs today are stable -She has no clinical, radiographic or lab findings suggestive of progression of her metastatic renal cell carcinoma. -Reviewed PET CT scan in details-evidence of renal cell carcinoma progression at this time. -No prohibitive toxicities from nivolumab. We will continue current treatment plan with nivolumab with same premedications. -Continue Xgeva every 6 weeks.  -Continue follow-up with Dr.  Mansouraty as per his recommendations for biliary issues and stent management. -Follow-up with primary care physician for management of  other medical issues. -Patient notes she will call Dr. Barry Dienes and discussed with her primary doctor and cardiologist regarding feasibility of umbilical hernia surgery.  FOLLOW UP: -Plz schedule next 6 Nivolumab infusions q2weeks with portflush and labs -Continue Xgeva every 6 weeks please schedule next 4 treatments MD visit in 6 weeks   The total time spent in the appt was 30 minutes and more than 50% was on counseling and direct patient cares, ordering and management of nivolumab immunotherapy and coordination of care. All of the patient's questions were answered with apparent satisfaction. The patient knows to call the clinic with any problems, questions or concerns.   Sullivan Lone MD [place] AAHIVMS [facility] Hematology/Oncology Physician [facility]  (Office):       [phone] (Work cell):  [phone] (Fax):           [phone]  I, Yevette Edwards, am acting as a scribe for Dr. Sullivan Lone.   .I have reviewed the above documentation for accuracy and completeness, and I agree with the above. Brunetta Genera MD

## 2020-02-15 ENCOUNTER — Other Ambulatory Visit: Payer: Self-pay | Admitting: Hematology

## 2020-02-16 ENCOUNTER — Inpatient Hospital Stay: Payer: Medicare Other

## 2020-02-16 ENCOUNTER — Inpatient Hospital Stay (HOSPITAL_BASED_OUTPATIENT_CLINIC_OR_DEPARTMENT_OTHER): Payer: Medicare Other | Admitting: Hematology

## 2020-02-16 ENCOUNTER — Other Ambulatory Visit: Payer: Self-pay

## 2020-02-16 VITALS — BP 143/63 | HR 72 | Temp 98.1°F | Resp 18 | Ht 59.0 in | Wt 134.0 lb

## 2020-02-16 DIAGNOSIS — C7951 Secondary malignant neoplasm of bone: Secondary | ICD-10-CM

## 2020-02-16 DIAGNOSIS — C642 Malignant neoplasm of left kidney, except renal pelvis: Secondary | ICD-10-CM | POA: Diagnosis not present

## 2020-02-16 DIAGNOSIS — Z7189 Other specified counseling: Secondary | ICD-10-CM

## 2020-02-16 DIAGNOSIS — Z95828 Presence of other vascular implants and grafts: Secondary | ICD-10-CM

## 2020-02-16 DIAGNOSIS — Z5112 Encounter for antineoplastic immunotherapy: Secondary | ICD-10-CM

## 2020-02-16 LAB — CBC WITH DIFFERENTIAL/PLATELET
Abs Immature Granulocytes: 0.06 10*3/uL (ref 0.00–0.07)
Basophils Absolute: 0.1 10*3/uL (ref 0.0–0.1)
Basophils Relative: 1 %
Eosinophils Absolute: 0.2 10*3/uL (ref 0.0–0.5)
Eosinophils Relative: 2 %
HCT: 34.3 % — ABNORMAL LOW (ref 36.0–46.0)
Hemoglobin: 10.7 g/dL — ABNORMAL LOW (ref 12.0–15.0)
Immature Granulocytes: 1 %
Lymphocytes Relative: 12 %
Lymphs Abs: 1.2 10*3/uL (ref 0.7–4.0)
MCH: 27.2 pg (ref 26.0–34.0)
MCHC: 31.2 g/dL (ref 30.0–36.0)
MCV: 87.3 fL (ref 80.0–100.0)
Monocytes Absolute: 1.1 10*3/uL — ABNORMAL HIGH (ref 0.1–1.0)
Monocytes Relative: 11 %
Neutro Abs: 7.5 10*3/uL (ref 1.7–7.7)
Neutrophils Relative %: 73 %
Platelets: 279 10*3/uL (ref 150–400)
RBC: 3.93 MIL/uL (ref 3.87–5.11)
RDW: 15.3 % (ref 11.5–15.5)
WBC: 10 10*3/uL (ref 4.0–10.5)
nRBC: 0 % (ref 0.0–0.2)

## 2020-02-16 LAB — CMP (CANCER CENTER ONLY)
ALT: 34 U/L (ref 0–44)
AST: 30 U/L (ref 15–41)
Albumin: 3.6 g/dL (ref 3.5–5.0)
Alkaline Phosphatase: 76 U/L (ref 38–126)
Anion gap: 9 (ref 5–15)
BUN: 34 mg/dL — ABNORMAL HIGH (ref 8–23)
CO2: 19 mmol/L — ABNORMAL LOW (ref 22–32)
Calcium: 9.4 mg/dL (ref 8.9–10.3)
Chloride: 111 mmol/L (ref 98–111)
Creatinine: 1.95 mg/dL — ABNORMAL HIGH (ref 0.44–1.00)
GFR, Estimated: 26 mL/min — ABNORMAL LOW (ref >60)
Glucose, Bld: 158 mg/dL — ABNORMAL HIGH (ref 70–99)
Potassium: 4.2 mmol/L (ref 3.5–5.1)
Sodium: 139 mmol/L (ref 135–145)
Total Bilirubin: 0.6 mg/dL (ref 0.3–1.2)
Total Protein: 6.7 g/dL (ref 6.5–8.1)

## 2020-02-16 MED ORDER — SODIUM CHLORIDE 0.9% FLUSH
10.0000 mL | INTRAVENOUS | Status: DC | PRN
  Administered 2020-02-16: 10 mL
  Filled 2020-02-16: qty 10

## 2020-02-16 MED ORDER — DENOSUMAB 120 MG/1.7ML ~~LOC~~ SOLN
120.0000 mg | Freq: Once | SUBCUTANEOUS | Status: AC
  Administered 2020-02-16: 120 mg via SUBCUTANEOUS

## 2020-02-16 MED ORDER — HEPARIN SOD (PORK) LOCK FLUSH 100 UNIT/ML IV SOLN
500.0000 [IU] | Freq: Once | INTRAVENOUS | Status: AC | PRN
  Administered 2020-02-16: 500 [IU]
  Filled 2020-02-16: qty 5

## 2020-02-16 MED ORDER — DENOSUMAB 120 MG/1.7ML ~~LOC~~ SOLN
SUBCUTANEOUS | Status: AC
  Filled 2020-02-16: qty 1.7

## 2020-02-16 MED ORDER — SODIUM CHLORIDE 0.9 % IV SOLN
240.0000 mg | Freq: Once | INTRAVENOUS | Status: AC
  Administered 2020-02-16: 240 mg via INTRAVENOUS
  Filled 2020-02-16: qty 24

## 2020-02-16 MED ORDER — SODIUM CHLORIDE 0.9 % IV SOLN
Freq: Once | INTRAVENOUS | Status: AC
  Filled 2020-02-16: qty 250

## 2020-02-16 MED ORDER — SODIUM CHLORIDE 0.9% FLUSH
10.0000 mL | Freq: Once | INTRAVENOUS | Status: AC
  Administered 2020-02-16: 10 mL
  Filled 2020-02-16: qty 10

## 2020-02-16 NOTE — Progress Notes (Signed)
Per [name], ok to treat with elevated creatinine. Also, advised denosumab will continue q 6 week schedule.

## 2020-02-16 NOTE — Patient Instructions (Signed)
[facility] Discharge Instructions for Patients Receiving Chemotherapy  Today you received the following chemotherapy agents: nivolumab.  To help prevent nausea and vomiting after your treatment, we encourage you to take your nausea medication as directed.   If you develop nausea and vomiting that is not controlled by your nausea medication, call the clinic.   BELOW ARE SYMPTOMS THAT SHOULD BE REPORTED IMMEDIATELY:  *FEVER GREATER THAN 100.5 F  *CHILLS WITH OR WITHOUT FEVER  NAUSEA AND VOMITING THAT IS NOT CONTROLLED WITH YOUR NAUSEA MEDICATION  *UNUSUAL SHORTNESS OF BREATH  *UNUSUAL BRUISING OR BLEEDING  TENDERNESS IN MOUTH AND THROAT WITH OR WITHOUT PRESENCE OF ULCERS  *URINARY PROBLEMS  *BOWEL PROBLEMS  UNUSUAL RASH Items with * indicate a potential emergency and should be followed up as soon as possible.  Feel free to call the clinic should you have any questions or concerns. The clinic phone number is [phone].  Please show the CHEMO ALERT CARD at check-in to the Emergency Department and triage nurse.   

## 2020-02-16 NOTE — Patient Instructions (Signed)

## 2020-02-17 ENCOUNTER — Telehealth: Payer: Self-pay | Admitting: Hematology

## 2020-02-17 NOTE — Telephone Encounter (Signed)
Scheduled per 10/27 los. Pt is aware of appt times and dates. Noted to give pt appt calendar on next visit.

## 2020-02-25 ENCOUNTER — Encounter: Payer: Self-pay | Admitting: Gastroenterology

## 2020-02-25 ENCOUNTER — Ambulatory Visit (INDEPENDENT_AMBULATORY_CARE_PROVIDER_SITE_OTHER): Payer: Medicare Other | Admitting: Gastroenterology

## 2020-02-25 VITALS — BP 122/70 | HR 69 | Ht 59.0 in | Wt 133.0 lb

## 2020-02-25 DIAGNOSIS — K2970 Gastritis, unspecified, without bleeding: Secondary | ICD-10-CM | POA: Diagnosis not present

## 2020-02-25 DIAGNOSIS — Z9889 Other specified postprocedural states: Secondary | ICD-10-CM

## 2020-02-25 DIAGNOSIS — K6289 Other specified diseases of anus and rectum: Secondary | ICD-10-CM

## 2020-02-25 DIAGNOSIS — K831 Obstruction of bile duct: Secondary | ICD-10-CM | POA: Diagnosis not present

## 2020-02-25 DIAGNOSIS — Z86010 Personal history of colonic polyps: Secondary | ICD-10-CM

## 2020-02-25 DIAGNOSIS — K5900 Constipation, unspecified: Secondary | ICD-10-CM

## 2020-02-25 DIAGNOSIS — K439 Ventral hernia without obstruction or gangrene: Secondary | ICD-10-CM

## 2020-02-25 DIAGNOSIS — K2990 Gastroduodenitis, unspecified, without bleeding: Secondary | ICD-10-CM

## 2020-02-25 NOTE — Patient Instructions (Addendum)
If you are age 78 or older, your body mass index should be between 23-30. Your Body mass index is 26.86 kg/m. If this is out of the aforementioned range listed, please consider follow up with your Primary Care Provider.  If you are age 78 or younger, your body mass index should be between 19-25. Your Body mass index is 26.86 kg/m. If this is out of the aformentioned range listed, please consider follow up with your Primary Care Provider.   Please start Colace 200 MG daily.  Please consider starting Fibercon daily.  Starting in [place] please decrease your Prilosec to one 20 MG tablet a day.  If you do not have a bowel movement after 2 days, please start Miralax daily.  Please follow up in 6 months, our office will call you to schedule.  Thank you for entrusting me with your care and choosing [facility].  Dr. Silvestre Moment

## 2020-02-26 ENCOUNTER — Encounter: Payer: Self-pay | Admitting: Gastroenterology

## 2020-02-26 DIAGNOSIS — K2990 Gastroduodenitis, unspecified, without bleeding: Secondary | ICD-10-CM | POA: Insufficient documentation

## 2020-02-26 DIAGNOSIS — K2970 Gastritis, unspecified, without bleeding: Secondary | ICD-10-CM | POA: Insufficient documentation

## 2020-02-26 DIAGNOSIS — Z860100 Personal history of colon polyps, unspecified: Secondary | ICD-10-CM | POA: Insufficient documentation

## 2020-02-26 DIAGNOSIS — K439 Ventral hernia without obstruction or gangrene: Secondary | ICD-10-CM | POA: Insufficient documentation

## 2020-02-26 DIAGNOSIS — K6289 Other specified diseases of anus and rectum: Secondary | ICD-10-CM | POA: Insufficient documentation

## 2020-02-26 NOTE — Progress Notes (Signed)
GASTROENTEROLOGY OUTPATIENT CLINIC VISIT   Primary Care Provider [name], MD [address], [place] [address] [phone]  Patient Profile: Audrey [name] is a 79 y.o. female with a pmh significant for RCC (s/p Nephrectomy on Chemo/Immunotherapy), DM, GERD, HTN, HLD, CAD, OA, uterovaginal prolapse (status post fixation) symptomatic Gallstone Disease (status post ERCPs and cholecystectomy), chronic constipation, distal biliary stricture (indeterminate concern for previous possible secondary malignancy though more recently negative cytology and now stent removed), chronic constipation, rectal prolapse.  The patient presents to the [facility] for an evaluation and management of problem(s) noted below:  Problem List 1. Biliary stricture   2. History of ERCP   3. Gastritis and gastroduodenitis   4. Constipation, unspecified constipation type   5. Ventral hernia without obstruction or gangrene   6. Rectal discomfort   7. History of colonic polyps    History of Present Illness: Please see prior consultation note/progress notes for full details of HPI.  Interval History Today, the patient comes in for scheduled follow-up.  She is accompanied by her granddaughter.  Since our last visit has undergone her ERCP with stent extraction.  Repeat liver tests after her ERCP have shown normalization of her liver tests.  The patient also is having significant issues of abdominal pain as documented in the chart.  These episodes have subsided significantly.  She remains on twice daily PPI.  Her constipation remains an issue and she is having bowel movements every 2 to 3 days.  When he gets the second or third day she will take laxatives in an effort of trying to get her to go.  Within the last week however she and her husband had a GI bug that is caused her to have more loose bowel movements than normal.  She has held on taking a laxative therapy currently.  She denies any  fevers or chills.  She has no significant abdominal pain.  She is scheduled to see general surgery for consideration of hernia repair due to umbilical and ventral hernia that are present post her prior procedures.  Her GERD symptoms are stable.  She has noted a protuberance of her rectum which she is concerned is whether she is having rectal prolapse or hemorrhoidal disease.  She is not interested in evaluation of her rectum at this time however.  Although she knows that the biliary stent was removed because I told her it had been, the recent PET/CT had suggested that the biliary stent was still in place.  She is hoping that this is a typographical error.  GI Review of Systems Positive as above Negative for odynophagia, dysphagia, change in bowel habits, melena, hematochezia   Review of Systems General: Denies fevers/chills Cardiovascular: Denies chest pain/palpitations Pulmonary: Denies shortness of breath Gastroenterological: See HPI Genitourinary: Denies darkened urine Hematological: Denies easy bruising Dermatological: Denies jaundice Psychological: Mood is stable   Medications Current Outpatient Medications  Medication Sig Dispense Refill  . amLODipine (NORVASC) 5 MG tablet Take 5 mg by mouth daily.   6  . atorvastatin (LIPITOR) 80 MG tablet Take 80 mg by mouth at bedtime.     . carvedilol (COREG) 6.25 MG tablet Take 6.25 mg by mouth 2 (two) times daily.  6  . cephALEXin (KEFLEX) 250 MG capsule Take 250 mg by mouth every Monday, Wednesday, and Friday.     . clobetasol ointment (TEMOVATE) 1.85 % Apply 1 application topically 2 (two) times daily as needed (lichen sclerosis).     Marland Kitchen  Denosumab (XGEVA [employer]) Inject into the skin every 6 (six) weeks.    . diphenhydrAMINE (BENADRYL) 25 MG tablet Take 25 mg by mouth at bedtime as needed for sleep.    Marland Kitchen gabapentin (NEURONTIN) 100 MG capsule Take 100 mg by mouth 3 (three) times daily.    Marland Kitchen glimepiride (AMARYL) 1 MG tablet Take 1 mg by mouth daily  with breakfast.    . hydrocortisone (CORTEF) 10 MG tablet Take 5-10 mg by mouth See admin instructions. Take 1 tablet (10 mg) by mouth in the morning & 1/2 tablet (5 mg) by mouth in the evening.    Marland Kitchen levothyroxine (SYNTHROID) 75 MCG tablet Take 75 mcg by mouth daily before breakfast.     . Lidocaine 4 % PTCH Apply 1 patch topically daily as needed (pain).    Marland Kitchen lidocaine-prilocaine (EMLA) cream Apply 1 application topically daily as needed (prior to port access). 30 g 1  . Nivolumab (OPDIVO IV) Inject 1 Dose into the vein every 14 (fourteen) days. [place]    . omeprazole (PRILOSEC) 20 MG capsule Take 1 capsule (20 mg total) by mouth 2 (two) times daily before a meal. 60 capsule 3  . sitaGLIPtin (JANUVIA) 50 MG tablet Take 50 mg by mouth daily.     No current facility-administered medications for this visit.   Facility-Administered Medications Ordered in Other Visits  Medication Dose Route Frequency Provider Last Rate Last Admin  . sodium chloride flush (NS) 0.9 % injection 10 mL  10 mL Intracatheter PRN Truitt Merle, MD   10 mL at 11/20/18 1232    Allergies Allergies  Allergen Reactions  . Adhesive [Tape] Other (See Comments)    Tears skin off Paper tape is ok per patient  . Nsaids Other (See Comments)    Pt only has one kidney   . Statins Other (See Comments)    Liver/kidney issues.    Histories Past Medical History:  Diagnosis Date  . Anemia   . Arthritis    lower back, hips, hands  . Biliary stricture   . Diabetes mellitus ([place])    type 2   . Early cataracts, bilateral    Md just watching  . Elevated liver enzymes   . GERD (gastroesophageal reflux disease)    occasional - diet controlled  . History of blood transfusion 2018  . History of hiatal hernia   . HTN (hypertension)   . Hyperlipidemia   . Hypothyroidism   . left renal ca dx'd 2018   renal cancer - left kidney removed, pill chemo x 1 yr  . Myocardial infarction ([place]) 1991   no deficits  . SVD  (spontaneous vaginal delivery)    x 3  . Wears glasses    Past Surgical History:  Procedure Laterality Date  . BALLOON DILATION N/A 07/23/2018   Procedure: BALLOON DILATION;  Surgeon: Rush Landmark Telford Nab., MD;  Location: [place];  Service: Gastroenterology;  Laterality: N/A;  . BILIARY BRUSHING  08/10/2018   Procedure: BILIARY BRUSHING;  Surgeon: Rush Landmark Telford Nab., MD;  Location: [place];  Service: Gastroenterology;;  . BILIARY BRUSHING  11/30/2018   Procedure: BILIARY BRUSHING;  Surgeon: Irving Copas., MD;  Location: [place];  Service: Gastroenterology;;  . BILIARY DILATION  08/10/2018   Procedure: BILIARY DILATION;  Surgeon: Irving Copas., MD;  Location: [place];  Service: Gastroenterology;;  . BILIARY DILATION  08/27/2018   Procedure: BILIARY DILATION;  Surgeon: Irving Copas., MD;  Location: [place];  Service:  Gastroenterology;;  . BILIARY DILATION  01/24/2020   Procedure: BILIARY DILATION;  Surgeon: Irving Copas., MD;  Location: [place];  Service: Gastroenterology;;  . BILIARY STENT PLACEMENT  08/10/2018   Procedure: BILIARY STENT PLACEMENT;  Surgeon: Irving Copas., MD;  Location: [place];  Service: Gastroenterology;;  . BILIARY STENT PLACEMENT  08/27/2018   Procedure: BILIARY STENT PLACEMENT;  Surgeon: Irving Copas., MD;  Location: [place];  Service: Gastroenterology;;  . BILIARY STENT PLACEMENT  11/30/2018   Procedure: BILIARY STENT PLACEMENT;  Surgeon: Irving Copas., MD;  Location: [place];  Service: Gastroenterology;;  . BIOPSY  07/23/2018   Procedure: BIOPSY;  Surgeon: Irving Copas., MD;  Location: [place];  Service: Gastroenterology;;  . BIOPSY  01/24/2020   Procedure: BIOPSY;  Surgeon: Irving Copas., MD;  Location: [place];  Service: Gastroenterology;;  . CHOLECYSTECTOMY N/A 09/02/2018   Procedure: LAPAROSCOPIC CHOLECYSTECTOMY;  Surgeon:  Stark Klein, MD;  Location: [place];  Service: General;  Laterality: N/A;  . COLONOSCOPY     normal   . ENDOSCOPIC RETROGRADE CHOLANGIOPANCREATOGRAPHY (ERCP) WITH PROPOFOL N/A 08/10/2018   Procedure: ENDOSCOPIC RETROGRADE CHOLANGIOPANCREATOGRAPHY (ERCP) WITH PROPOFOL;  Surgeon: Irving Copas., MD;  Location: [place];  Service: Gastroenterology;  Laterality: N/A;  . ENDOSCOPIC RETROGRADE CHOLANGIOPANCREATOGRAPHY (ERCP) WITH PROPOFOL N/A 11/30/2018   Procedure: ENDOSCOPIC RETROGRADE CHOLANGIOPANCREATOGRAPHY (ERCP) WITH PROPOFOL;  Surgeon: Rush Landmark Telford Nab., MD;  Location: [place];  Service: Gastroenterology;  Laterality: N/A;  . ENDOSCOPIC RETROGRADE CHOLANGIOPANCREATOGRAPHY (ERCP) WITH PROPOFOL N/A 01/24/2020   Procedure: ENDOSCOPIC RETROGRADE CHOLANGIOPANCREATOGRAPHY (ERCP) WITH PROPOFOL;  Surgeon: Rush Landmark Telford Nab., MD;  Location: [place];  Service: Gastroenterology;  Laterality: N/A;  . ERCP N/A 07/23/2018   Procedure: ENDOSCOPIC RETROGRADE CHOLANGIOPANCREATOGRAPHY (ERCP);  Surgeon: Irving Copas., MD;  Location: [place];  Service: Gastroenterology;  Laterality: N/A;  . ERCP N/A 08/27/2018   Procedure: ENDOSCOPIC RETROGRADE CHOLANGIOPANCREATOGRAPHY (ERCP);  Surgeon: Irving Copas., MD;  Location: [place];  Service: Gastroenterology;  Laterality: N/A;  . ESOPHAGOGASTRODUODENOSCOPY N/A 01/24/2020   Procedure: ESOPHAGOGASTRODUODENOSCOPY (EGD);  Surgeon: Irving Copas., MD;  Location: [place];  Service: Gastroenterology;  Laterality: N/A;  . ESOPHAGOGASTRODUODENOSCOPY (EGD) WITH PROPOFOL N/A 08/27/2018   Procedure: ESOPHAGOGASTRODUODENOSCOPY (EGD) WITH PROPOFOL;  Surgeon: Rush Landmark Telford Nab., MD;  Location: [place];  Service: Gastroenterology;  Laterality: N/A;  . EUS N/A 08/27/2018   Procedure: ESOPHAGEAL ENDOSCOPIC ULTRASOUND (EUS) RADIAL;  Surgeon: Rush Landmark Telford Nab., MD;  Location: [place];  Service:  Gastroenterology;  Laterality: N/A;  . FINE NEEDLE ASPIRATION  08/27/2018   Procedure: FINE NEEDLE ASPIRATION (FNA) LINEAR;  Surgeon: Irving Copas., MD;  Location: [place];  Service: Gastroenterology;;  . IR IMAGING GUIDED PORT INSERTION  01/08/2018  . LAPAROSCOPIC NEPHRECTOMY Left 08/01/2016   Procedure: LAPAROSCOPIC  RADICAL NEPHRECTOMY/ REPAIR OF UMBILICAL HERNIA;  Surgeon: Raynelle Bring, MD;  Location: [facility];  Service: Urology;  Laterality: Left;  . REMOVAL OF STONES  07/23/2018   Procedure: REMOVAL OF GALL STONES;  Surgeon: Rush Landmark Telford Nab., MD;  Location: [place];  Service: Gastroenterology;;  . REMOVAL OF STONES  08/10/2018   Procedure: REMOVAL OF STONES;  Surgeon: Irving Copas., MD;  Location: [place];  Service: Gastroenterology;;  . REMOVAL OF STONES  08/27/2018   Procedure: REMOVAL OF STONES;  Surgeon: Irving Copas., MD;  Location: [place];  Service: Gastroenterology;;  . REMOVAL OF STONES  01/24/2020   Procedure: REMOVAL OF STONES;  Surgeon: Irving Copas., MD;  Location: [facility]  ENDOSCOPY;  Service: Gastroenterology;;  . [name]  07/23/2018   Procedure: SPHINCTEROTOMY;  Surgeon: Irving Copas., MD;  Location: [place];  Service: Gastroenterology;;  . Lavell Islam REMOVAL  08/27/2018   Procedure: STENT REMOVAL;  Surgeon: Irving Copas., MD;  Location: [place];  Service: Gastroenterology;;  . Lavell Islam REMOVAL  11/30/2018   Procedure: STENT REMOVAL;  Surgeon: Irving Copas., MD;  Location: [place];  Service: Gastroenterology;;  . Lavell Islam REMOVAL  01/24/2020   Procedure: STENT REMOVAL;  Surgeon: Irving Copas., MD;  Location: [place];  Service: Gastroenterology;;  . UPPER GI ENDOSCOPY     x 1  . VAGINAL PROLAPSE REPAIR  11/18/2019   [facility]   Social History   Socioeconomic History  . Marital status: Married    Spouse name: Not on file  . Number of children: 3  . Years of  education: Not on file  . Highest education level: Not on file  Occupational History  . Occupation: retired  Tobacco Use  . Smoking status: Former Smoker    Packs/day: 1.00    Years: 8.00    Pack years: 8.00    Types: Cigarettes    Quit date: 06/18/1964    Years since quitting: 55.7  . Smokeless tobacco: Never Used  Vaping Use  . Vaping Use: Never used  Substance and Sexual Activity  . Alcohol use: No  . Drug use: No  . Sexual activity: Not on file  Other Topics Concern  . Not on file  Social History Narrative   Married.  Three children   Social Determinants of Health   Financial Resource Strain:   . Difficulty of Paying Living Expenses: Not on file  Food Insecurity:   . Worried About Charity fundraiser in the Last Year: Not on file  . Ran Out of Food in the Last Year: Not on file  Transportation Needs:   . Lack of Transportation (Medical): Not on file  . Lack of Transportation (Non-Medical): Not on file  Physical Activity:   . Days of Exercise per Week: Not on file  . Minutes of Exercise per Session: Not on file  Stress:   . Feeling of Stress : Not on file  Social Connections:   . Frequency of Communication with Friends and Family: Not on file  . Frequency of Social Gatherings with Friends and Family: Not on file  . Attends Religious Services: Not on file  . Active Member of Clubs or Organizations: Not on file  . Attends Archivist Meetings: Not on file  . Marital Status: Not on file  Intimate Partner Violence:   . Fear of Current or Ex-Partner: Not on file  . Emotionally Abused: Not on file  . Physically Abused: Not on file  . Sexually Abused: Not on file   Family History  Problem Relation Age of Onset  . Heart attack Father 36  . Heart disease Brother 39       CABG  . Colon cancer Neg Hx   . Esophageal cancer Neg Hx   . Inflammatory bowel disease Neg Hx   . Liver disease Neg Hx   . Pancreatic cancer Neg Hx   . Rectal cancer Neg Hx   .  Stomach cancer Neg Hx    I have reviewed her medical, social, and family history in detail and updated the electronic medical record as necessary.    PHYSICAL EXAMINATION  BP 122/70   Pulse 69   Ht 4'  11" (1.499 m)   Wt 133 lb (60.3 kg)   LMP  (LMP Unknown)   SpO2 97%   BMI 26.86 kg/m  GEN: NAD, appears stated age, doesn't appear chronically ill, accompanied by granddaughter PSYCH: Cooperative, without pressured speech EYE: Conjunctivae pink, sclerae anicteric ENT: MMM CV: Nontachycardic RESP: Without audible wheezing GI: NABS, soft, protuberant abdomen, rounded, nontender, surgical scars present that are well-healed but with evidence of ventral/umbilical hernia,, without rebound or guarding GU: Deferred by patient at this time MSK/EXT: No lower extremity edema SKIN: No jaundice NEURO:  Alert & Oriented x 3, no focal deficits   REVIEW OF DATA  I reviewed the following data at the time of this encounter:  GI Procedures and Studies  October 2021 ERCP - No gross lesions in esophagus proximally. Salmon-colored mucosa suspicious for short segment Barrett's esophagus - biopsied. Z-line regular, 37 cm from the incisors. - Erythematous mucosa in the gastric body. No other gross lesions in the stomach. - No gross lesions in the duodenal bulb. - One partially occluded stent from the biliary tree was seen at the major papilla with evidence of tissue ingrowth (even though this was a fully covered metal stent) and some biliary sludge. - The major papilla appeared edematous and slightly ulcerated - query from biliary stent being in place. Biopsied to rule out ampullary adenomatous tissue. - The fluoroscopic examination was suspicious for sludge. - The entire main bile duct was mildly dilated. - The biliary tree was swept and sludge was found. - One covered metal stent was removed from the biliary tree. - The biliary tree was swept and sludge was found. - Distal CBD was successfully  dilated to ensure adequate drainage. - The biliary tree was swept and nothing was found. Pathology FINAL MICROSCOPIC DIAGNOSIS:  A. STOMACH, BIOPSY:  - Gastric antral and oxyntic mucosa with mild chronic gastritis  - Warthin Starry stain is negative for Helicobacter pylori  B. ESOPHAGUS, DISTAL, BIOPSY:  - Esophageal squamous and cardiac mucosa with no specific  histopathologic changes  - Negative for intestinal metaplasia or dysplasia  C. BILIARY STENT:  - Duodenal and ampullary mucosa with nonspecific inflammatory and  architectural changes  - Negative for dysplasia or malignancy  D. AMPULLA, BIOPSY:  - Ampullary mucosa showing acute and chronic inflammation with foveolar metaplasia, suggestive of peptic injury  - Negative for dysplasia or malignancy   Laboratory Studies  Reviewed in epic most recent labs  Imaging Studies  October 2021 PET/CT IMPRESSION: 1. No evidence of renal cell carcinoma recurrence or metastasis. 2. No interval change from comparison PET-CT scan 07/02/2019. 3. Post LEFT nephrectomy.   ASSESSMENT  Ms. Westenberger is a 79 y.o. female with a pmh significant for RCC (s/p Nephrectomy on Chemo/Immunotherapy), DM, GERD, HTN, HLD, CAD, OA, uterovaginal prolapse (status post fixation) symptomatic Gallstone Disease (status post ERCPs and cholecystectomy), chronic constipation, distal biliary stricture (indeterminate concern for previous possible secondary malignancy though more recently negative cytology and now stent removed), chronic constipation, rectal prolapse.  The patient is seen today for evaluation and management of:  1. Biliary stricture   2. History of ERCP   3. Gastritis and gastroduodenitis   4. Constipation, unspecified constipation type   5. Ventral hernia without obstruction or gangrene   6. Rectal discomfort   7. History of colonic polyps    Overall, the patient is hemodynamically and clinically stable at this time.  She has had continued  normalization of her LFT pattern and her stent is been  out for the last month.  She is doing well.  Abdominal discomfort improved.  Constipation remains an issue and have asked her to restart stool softeners daily as well as MiraLAX daily in an effort of trying to minimize her having to use significant laxatives if she has not gone for few days.  She will restart this after this GI bug that is going through her and her husband at this time improves (it is slowly improving already).  She has had some rectal discomfort which is concern is her rectal prolapse although her surgeon (urogynecologist) had not suggested any issues there had been consideration in the past whether she has a rectal prolapse or hemorrhoidal disease.  She will be due for colon cancer screening/colon polyp surveillance next year (5 years from last procedure).  We will get a better evaluation of her colon next year and have an opportunity to evaluate the rectal discomfort that she has sparingly.  Hopefully that will be her last colonoscopy unless significant colon polyp burden is found.  I will ask our radiology colleagues to review the last PET/CT as the biliary stent had been removed during her last ERCP for an amendment if possible.  Hopefully, the previous biliary stricture would not recur or require [name] to replace biliary stent.  We will see.  She will continue her current PPI therapy plan to decrease to 20 mg once daily in January.  Appreciate general surgery evaluating the patient for potential hernia repair if they feel she is a candidate for that.  She has a clinic appointment scheduled later this month.  All patient questions were answered to the best of my ability, and the patient agrees to the aforementioned plan of action with follow-up as indicated.   PLAN  Continue PPI twice daily for now -In January titrate to PPI once daily Restart FiberCon daily Restart Colace 200 mg daily Restart MiraLAX 1 capful daily if no bowel  movement after 2 to 3 days May consider readdition of Linzess in future Colonoscopy for screening/surveillance Follow-up with general surgery for consideration of hernia repair   No orders of the defined types were placed in this encounter.   New Prescriptions   No medications on file   Modified Medications   No medications on file    Planned Follow Up: No follow-ups on file.    Total Time in Face-to-Face and in Coordination of Care for patient including independent/personal interpretation/review of prior testing, medical history, examination, medication adjustment, communicating results with the patient directly, and documentation with the EHR is 30 minutes.    Justice Britain, MD [facility] Office # [phone]

## 2020-02-28 ENCOUNTER — Telehealth: Payer: Self-pay

## 2020-02-28 NOTE — Telephone Encounter (Signed)
Patient checking appointment times. Patient received a text message about come into a appointment at 11am. Text message about appointment for COVID boaster. Advised patient of appointment times. Patient verbalized understanding.

## 2020-03-01 ENCOUNTER — Inpatient Hospital Stay: Payer: Medicare Other | Attending: Hematology

## 2020-03-01 ENCOUNTER — Inpatient Hospital Stay: Payer: Medicare Other

## 2020-03-01 ENCOUNTER — Other Ambulatory Visit: Payer: Self-pay

## 2020-03-01 VITALS — BP 131/59 | HR 70 | Temp 98.5°F | Resp 18

## 2020-03-01 DIAGNOSIS — Z5112 Encounter for antineoplastic immunotherapy: Secondary | ICD-10-CM | POA: Diagnosis not present

## 2020-03-01 DIAGNOSIS — C642 Malignant neoplasm of left kidney, except renal pelvis: Secondary | ICD-10-CM

## 2020-03-01 DIAGNOSIS — Z23 Encounter for immunization: Secondary | ICD-10-CM

## 2020-03-01 DIAGNOSIS — Z7189 Other specified counseling: Secondary | ICD-10-CM

## 2020-03-01 DIAGNOSIS — C7951 Secondary malignant neoplasm of bone: Secondary | ICD-10-CM

## 2020-03-01 LAB — CBC WITH DIFFERENTIAL/PLATELET
Abs Immature Granulocytes: 0.18 10*3/uL — ABNORMAL HIGH (ref 0.00–0.07)
Basophils Absolute: 0.1 10*3/uL (ref 0.0–0.1)
Basophils Relative: 1 %
Eosinophils Absolute: 0.2 10*3/uL (ref 0.0–0.5)
Eosinophils Relative: 2 %
HCT: 33.6 % — ABNORMAL LOW (ref 36.0–46.0)
Hemoglobin: 10.7 g/dL — ABNORMAL LOW (ref 12.0–15.0)
Immature Granulocytes: 2 %
Lymphocytes Relative: 13 %
Lymphs Abs: 1.3 10*3/uL (ref 0.7–4.0)
MCH: 27.9 pg (ref 26.0–34.0)
MCHC: 31.8 g/dL (ref 30.0–36.0)
MCV: 87.7 fL (ref 80.0–100.0)
Monocytes Absolute: 1 10*3/uL (ref 0.1–1.0)
Monocytes Relative: 10 %
Neutro Abs: 7.2 10*3/uL (ref 1.7–7.7)
Neutrophils Relative %: 72 %
Platelets: 379 10*3/uL (ref 150–400)
RBC: 3.83 MIL/uL — ABNORMAL LOW (ref 3.87–5.11)
RDW: 14.9 % (ref 11.5–15.5)
WBC: 10 10*3/uL (ref 4.0–10.5)
nRBC: 0 % (ref 0.0–0.2)

## 2020-03-01 LAB — CMP (CANCER CENTER ONLY)
ALT: 17 U/L (ref 0–44)
AST: 22 U/L (ref 15–41)
Albumin: 3.4 g/dL — ABNORMAL LOW (ref 3.5–5.0)
Alkaline Phosphatase: 70 U/L (ref 38–126)
Anion gap: 8 (ref 5–15)
BUN: 41 mg/dL — ABNORMAL HIGH (ref 8–23)
CO2: 19 mmol/L — ABNORMAL LOW (ref 22–32)
Calcium: 8.9 mg/dL (ref 8.9–10.3)
Chloride: 113 mmol/L — ABNORMAL HIGH (ref 98–111)
Creatinine: 2.19 mg/dL — ABNORMAL HIGH (ref 0.44–1.00)
GFR, Estimated: 23 mL/min — ABNORMAL LOW (ref >60)
Glucose, Bld: 150 mg/dL — ABNORMAL HIGH (ref 70–99)
Potassium: 3.8 mmol/L (ref 3.5–5.1)
Sodium: 140 mmol/L (ref 135–145)
Total Bilirubin: 0.4 mg/dL (ref 0.3–1.2)
Total Protein: 6.7 g/dL (ref 6.5–8.1)

## 2020-03-01 MED ORDER — HEPARIN SOD (PORK) LOCK FLUSH 100 UNIT/ML IV SOLN
500.0000 [IU] | Freq: Once | INTRAVENOUS | Status: AC | PRN
  Administered 2020-03-01: 500 [IU]
  Filled 2020-03-01: qty 5

## 2020-03-01 MED ORDER — SODIUM CHLORIDE 0.9% FLUSH
10.0000 mL | INTRAVENOUS | Status: DC | PRN
  Administered 2020-03-01: 10 mL
  Filled 2020-03-01: qty 10

## 2020-03-01 MED ORDER — SODIUM CHLORIDE 0.9 % IV SOLN
240.0000 mg | Freq: Once | INTRAVENOUS | Status: AC
  Administered 2020-03-01: 240 mg via INTRAVENOUS
  Filled 2020-03-01: qty 24

## 2020-03-01 MED ORDER — SODIUM CHLORIDE 0.9 % IV SOLN
Freq: Once | INTRAVENOUS | Status: AC
  Filled 2020-03-01: qty 250

## 2020-03-01 NOTE — Patient Instructions (Signed)
Cancer Center Discharge Instructions for Patients Receiving Chemotherapy  Today you received the following chemotherapy agents :  Opdivo.  To help prevent nausea and vomiting after your treatment, we encourage you to take your nausea medication as prescribed.   If you develop nausea and vomiting that is not controlled by your nausea medication, call the clinic.   BELOW ARE SYMPTOMS THAT SHOULD BE REPORTED IMMEDIATELY:  *FEVER GREATER THAN 100.5 F  *CHILLS WITH OR WITHOUT FEVER  NAUSEA AND VOMITING THAT IS NOT CONTROLLED WITH YOUR NAUSEA MEDICATION  *UNUSUAL SHORTNESS OF BREATH  *UNUSUAL BRUISING OR BLEEDING  TENDERNESS IN MOUTH AND THROAT WITH OR WITHOUT PRESENCE OF ULCERS  *URINARY PROBLEMS  *BOWEL PROBLEMS  UNUSUAL RASH Items with * indicate a potential emergency and should be followed up as soon as possible.  Feel free to call the clinic should you have any questions or concerns. The clinic phone number is [phone].  Please show the CHEMO ALERT CARD at check-in to the Emergency Department and triage nurse.   

## 2020-03-01 NOTE — Progress Notes (Signed)
   Covid-19 Vaccination Clinic  Name:  [name]    MRN: [id number] DOB: February 19, 1941  03/01/2020  Ms. Stahnke was observed post Covid-19 immunization for her Pfizer booster injection without incident. She was provided with Vaccine Information Sheet and instruction to access the V-Safe system.   Ms. Dibiasio was instructed to call 911 with any severe reactions post vaccine: Marland Kitchen Difficulty breathing  . Swelling of face and throat  . A fast heartbeat  . A bad rash all over body  . Dizziness and weakness

## 2020-03-01 NOTE — Progress Notes (Signed)
Verbal order per [name]: Madaline Brilliant to receive opdivo today with Creatinine of 2.19

## 2020-03-14 ENCOUNTER — Other Ambulatory Visit: Payer: Self-pay | Admitting: Hematology

## 2020-03-15 ENCOUNTER — Telehealth: Payer: Self-pay

## 2020-03-15 ENCOUNTER — Inpatient Hospital Stay: Payer: Medicare Other

## 2020-03-15 ENCOUNTER — Other Ambulatory Visit: Payer: Self-pay

## 2020-03-15 VITALS — BP 149/68 | HR 64 | Temp 98.3°F | Resp 18 | Wt 133.0 lb

## 2020-03-15 DIAGNOSIS — C642 Malignant neoplasm of left kidney, except renal pelvis: Secondary | ICD-10-CM

## 2020-03-15 DIAGNOSIS — C7951 Secondary malignant neoplasm of bone: Secondary | ICD-10-CM

## 2020-03-15 DIAGNOSIS — Z5112 Encounter for antineoplastic immunotherapy: Secondary | ICD-10-CM | POA: Diagnosis not present

## 2020-03-15 DIAGNOSIS — Z95828 Presence of other vascular implants and grafts: Secondary | ICD-10-CM

## 2020-03-15 DIAGNOSIS — Z7189 Other specified counseling: Secondary | ICD-10-CM

## 2020-03-15 LAB — CBC WITH DIFFERENTIAL/PLATELET
Abs Immature Granulocytes: 0.07 10*3/uL (ref 0.00–0.07)
Basophils Absolute: 0.1 10*3/uL (ref 0.0–0.1)
Basophils Relative: 1 %
Eosinophils Absolute: 0.2 10*3/uL (ref 0.0–0.5)
Eosinophils Relative: 3 %
HCT: 32.5 % — ABNORMAL LOW (ref 36.0–46.0)
Hemoglobin: 10.4 g/dL — ABNORMAL LOW (ref 12.0–15.0)
Immature Granulocytes: 1 %
Lymphocytes Relative: 14 %
Lymphs Abs: 1.1 10*3/uL (ref 0.7–4.0)
MCH: 27.7 pg (ref 26.0–34.0)
MCHC: 32 g/dL (ref 30.0–36.0)
MCV: 86.7 fL (ref 80.0–100.0)
Monocytes Absolute: 0.8 10*3/uL (ref 0.1–1.0)
Monocytes Relative: 10 %
Neutro Abs: 5.7 10*3/uL (ref 1.7–7.7)
Neutrophils Relative %: 71 %
Platelets: 264 10*3/uL (ref 150–400)
RBC: 3.75 MIL/uL — ABNORMAL LOW (ref 3.87–5.11)
RDW: 15.7 % — ABNORMAL HIGH (ref 11.5–15.5)
WBC: 8 10*3/uL (ref 4.0–10.5)
nRBC: 0 % (ref 0.0–0.2)

## 2020-03-15 LAB — CMP (CANCER CENTER ONLY)
ALT: 18 U/L (ref 0–44)
AST: 23 U/L (ref 15–41)
Albumin: 3.4 g/dL — ABNORMAL LOW (ref 3.5–5.0)
Alkaline Phosphatase: 67 U/L (ref 38–126)
Anion gap: 9 (ref 5–15)
BUN: 33 mg/dL — ABNORMAL HIGH (ref 8–23)
CO2: 17 mmol/L — ABNORMAL LOW (ref 22–32)
Calcium: 8.3 mg/dL — ABNORMAL LOW (ref 8.9–10.3)
Chloride: 116 mmol/L — ABNORMAL HIGH (ref 98–111)
Creatinine: 1.98 mg/dL — ABNORMAL HIGH (ref 0.44–1.00)
GFR, Estimated: 25 mL/min — ABNORMAL LOW (ref >60)
Glucose, Bld: 145 mg/dL — ABNORMAL HIGH (ref 70–99)
Potassium: 3.8 mmol/L (ref 3.5–5.1)
Sodium: 142 mmol/L (ref 135–145)
Total Bilirubin: 0.4 mg/dL (ref 0.3–1.2)
Total Protein: 6.5 g/dL (ref 6.5–8.1)

## 2020-03-15 MED ORDER — SODIUM CHLORIDE 0.9 % IV SOLN
Freq: Once | INTRAVENOUS | Status: AC
  Filled 2020-03-15: qty 250

## 2020-03-15 MED ORDER — HEPARIN SOD (PORK) LOCK FLUSH 100 UNIT/ML IV SOLN
500.0000 [IU] | Freq: Once | INTRAVENOUS | Status: AC | PRN
  Administered 2020-03-15: 500 [IU]
  Filled 2020-03-15: qty 5

## 2020-03-15 MED ORDER — SODIUM CHLORIDE 0.9 % IV SOLN
240.0000 mg | Freq: Once | INTRAVENOUS | Status: AC
  Administered 2020-03-15: 240 mg via INTRAVENOUS
  Filled 2020-03-15: qty 24

## 2020-03-15 MED ORDER — SODIUM CHLORIDE 0.9% FLUSH
10.0000 mL | INTRAVENOUS | Status: DC | PRN
  Administered 2020-03-15: 10 mL
  Filled 2020-03-15: qty 10

## 2020-03-15 MED ORDER — SODIUM CHLORIDE 0.9% FLUSH
10.0000 mL | Freq: Once | INTRAVENOUS | Status: AC
  Administered 2020-03-15: 10 mL
  Filled 2020-03-15: qty 10

## 2020-03-15 NOTE — Progress Notes (Signed)
Per Dr. Lorenso Courier, ok to treat with Scr. 1.98.

## 2020-03-15 NOTE — Patient Instructions (Signed)
[facility] Discharge Instructions for Patients Receiving Chemotherapy  Today you received the following chemotherapy agents :  Opdivo.  To help prevent nausea and vomiting after your treatment, we encourage you to take your nausea medication as prescribed.   If you develop nausea and vomiting that is not controlled by your nausea medication, call the clinic.   BELOW ARE SYMPTOMS THAT SHOULD BE REPORTED IMMEDIATELY:  *FEVER GREATER THAN 100.5 F  *CHILLS WITH OR WITHOUT FEVER  NAUSEA AND VOMITING THAT IS NOT CONTROLLED WITH YOUR NAUSEA MEDICATION  *UNUSUAL SHORTNESS OF BREATH  *UNUSUAL BRUISING OR BLEEDING  TENDERNESS IN MOUTH AND THROAT WITH OR WITHOUT PRESENCE OF ULCERS  *URINARY PROBLEMS  *BOWEL PROBLEMS  UNUSUAL RASH Items with * indicate a potential emergency and should be followed up as soon as possible.  Feel free to call the clinic should you have any questions or concerns. The clinic phone number is [phone].  Please show the CHEMO ALERT CARD at check-in to the Emergency Department and triage nurse.   

## 2020-03-15 NOTE — Telephone Encounter (Signed)
   Primary Cardiologist: Dr. Percival Spanish  Chart reviewed as part of pre-operative protocol coverage. Patient has not been seen since 07/2018. Therefore,  she will require a follow-up visit in order to better assess preoperative cardiovascular risk.  Pre-op covering staff: - Please schedule appointment and call patient to inform them. If patient already had an upcoming appointment within acceptable timeframe, please add "pre-op clearance" to the appointment notes so provider is aware. - Please contact requesting surgeon's office via preferred method (i.e, phone, fax) to inform them of need for appointment prior to surgery.  If applicable, this message will also be routed to pharmacy pool and/or primary cardiologist for input on holding anticoagulant/antiplatelet agent as requested below so that this information is available to the clearing provider at time of patient's appointment.   I will remove from pre-op pool.  Darreld Mclean, PA-C  03/15/2020, 3:48 PM

## 2020-03-15 NOTE — Telephone Encounter (Signed)
Called and left a voice message for the patient to give our office a call back to be scheduled for an appointment for pre-op/cardiac clearance. Will route to NL scheduling

## 2020-03-15 NOTE — Telephone Encounter (Signed)
   [facility] Pre-operative Risk Assessment    Request for surgical clearance:  1. What type of surgery is being performed? Hernia   2. When is this surgery scheduled? TBD   3. What type of clearance is required (medical clearance vs. Pharmacy clearance to hold med vs. Both)? Both  4. Are there any medications that need to be held prior to surgery and how long? Please review  5. Practice name and name of physician performing surgery? Louanna Raw, MD / [facility]   6. What is the office phone number? [phone]   7.   What is the office fax number? [phone]  8.   Anesthesia type (None, local, MAC, general) ? general   Sherrie Mustache 03/15/2020, 3:34 PM  _________________________________________________________________   (provider comments below)

## 2020-03-21 ENCOUNTER — Telehealth: Payer: Self-pay | Admitting: *Deleted

## 2020-03-21 ENCOUNTER — Encounter: Payer: Self-pay | Admitting: *Deleted

## 2020-03-21 NOTE — Telephone Encounter (Signed)
S/w pt in regards to needing a pre op appt for clearance. Pt agreeable to plan of care and has been scheduled to see Almyra Deforest, [facility] 03/23/20 @ 1:45 pm. Pt thanked me for the call and the help. Will forward clearance notes to PA for upcoming appt. Will send FYI to requesting office pt has appt. Will remove from the pre op call back pool.

## 2020-03-21 NOTE — Telephone Encounter (Signed)
Received fax from [facility] Surgeon (Dr. Thermon Leyland) requesting letter of medical clearance - if appropriate - from [name] for patient to Have Hernia repair. Letter requested that office fax Dr. Grier Mitts letter to [place], [place] or call [phone]  Letter faxed - fax confirmation received.

## 2020-03-22 ENCOUNTER — Telehealth: Payer: Self-pay | Admitting: *Deleted

## 2020-03-22 NOTE — Telephone Encounter (Signed)
error 

## 2020-03-23 ENCOUNTER — Encounter: Payer: Self-pay | Admitting: Physician Assistant

## 2020-03-23 ENCOUNTER — Ambulatory Visit (INDEPENDENT_AMBULATORY_CARE_PROVIDER_SITE_OTHER): Payer: Medicare Other | Admitting: Physician Assistant

## 2020-03-23 ENCOUNTER — Other Ambulatory Visit: Payer: Self-pay

## 2020-03-23 VITALS — BP 124/60 | HR 75 | Ht 59.0 in | Wt 133.8 lb

## 2020-03-23 DIAGNOSIS — I2510 Atherosclerotic heart disease of native coronary artery without angina pectoris: Secondary | ICD-10-CM

## 2020-03-23 DIAGNOSIS — E785 Hyperlipidemia, unspecified: Secondary | ICD-10-CM

## 2020-03-23 DIAGNOSIS — I10 Essential (primary) hypertension: Secondary | ICD-10-CM

## 2020-03-23 DIAGNOSIS — Z01810 Encounter for preprocedural cardiovascular examination: Secondary | ICD-10-CM | POA: Diagnosis not present

## 2020-03-23 DIAGNOSIS — E039 Hypothyroidism, unspecified: Secondary | ICD-10-CM

## 2020-03-23 DIAGNOSIS — C642 Malignant neoplasm of left kidney, except renal pelvis: Secondary | ICD-10-CM

## 2020-03-23 NOTE — Patient Instructions (Signed)
Medication Instructions:  Your physician recommends that you continue on your current medications as directed. Please refer to the Current Medication list given to you today.  *If you need a refill on your cardiac medications before your next appointment, please call your pharmacy*  Lab Work: NONE ordered at this time of appointment   If you have labs (blood work) drawn today and your tests are completely normal, you will receive your results only by: Marland Kitchen MyChart Message (if you have MyChart) OR . A paper copy in the mail If you have any lab test that is abnormal or we need to change your treatment, we will call you to review the results.  Testing/Procedures: NONE ordered at this time of appointment   Follow-Up: At [facility], you and your health needs are our priority.  As part of our continuing mission to provide you with exceptional heart care, we have created designated Provider Care Teams.  These Care Teams include your primary Cardiologist (physician) and Advanced Practice Providers (APPs -  Physician Assistants and Nurse Practitioners) who all work together to provide you with the care you need, when you need it.  Your next appointment:   Follow up as Needed    The format for your next appointment:   In Person  Provider:   You may see Minus Breeding, MD or one of the following Advanced Practice Providers on your designated Care Team:    Rosaria Ferries, PA-C  Jory Sims, DNP, ANP  Other Instructions

## 2020-03-23 NOTE — Progress Notes (Signed)
Cardiology Office Note:    Date:  03/25/2020   ID:  Audrey [name], DOB 02-Mar-1941, MRN [id number]  PCP:  [name], MD  [facility] Cardiologist:  [facility] Electrophysiologist:  None   Referring MD: [name], MD   Chief Complaint  Patient presents with  . Follow-up    seen for Dr. Percival Spanish    History of Present Illness:    Audrey [name] is a 79 y.o. 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  Patient was initially referred to cardiology service in April 2020 due to abnormal stress test.  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  She did not have any angiopathy and has not had any issues since then.  Myoview obtained on 12/08/2017 suggested inferior infarct with small area of ischemia.  She also had mildly low EF.  Follow-up echocardiogram demonstrated normal ejection fraction with no inferior wall motion abnormality.  She was last seen by Dr. Percival Spanish on 08/14/2018 for preoperative clearance prior to gallbladder surgery.  Patient had no unstable symptoms at the time and had very good functional ability, therefore she was cleared without further work-up.  She is being followed closely by oncology service.  Most recent PET scan obtained in March 2021 showed no evidence of hypermetabolic metastasis.  She is receiving chemotherapy infusion through [facility].  Patient is scheduled to proceed with a umbilical hernia repair by Dr. Louanna Raw of [facility].  She denies any recent chest pain or shortness of breath.  She has no lower extremity edema, orthopnea or PND.  EKG continue to show mild T wave inversion in the lateral leads that is unchanged.  She cannot do more than 4 METS of activity due to back pain and sciatica.  Her husband does  the vacuuming of the home, however she cooks and cleans the rest of the home without any chest discomfort or shortness of breath.  She denies any significant cardiac complications after the previous gallbladder procedure.  She is not on any blood thinners.  She reported that her aspirin was taken off by oncology service after her diagnosis of cancer.  I discussed her case with Dr. Sallyanne Kuster, DOD, she is cleared to proceed with umbilical hernia repair.   Past Medical History:  Diagnosis Date  . Anemia   . Arthritis    lower back, hips, hands  . Biliary stricture   . Diabetes mellitus ([place])    type 2   . Early cataracts, bilateral    [place] just watching  . Elevated liver enzymes   . GERD (gastroesophageal reflux disease)    occasional - diet controlled  . History of blood transfusion 2018  . History of hiatal hernia   . HTN (hypertension)   . Hyperlipidemia   . Hypothyroidism   . left renal ca dx'd 2018   renal cancer - left kidney removed, pill chemo x 1 yr  . Myocardial infarction ([place]) 1991   no deficits  . SVD (spontaneous vaginal delivery)    x 3  . Wears glasses     Past Surgical History:  Procedure Laterality Date  . BALLOON DILATION N/A 07/23/2018   Procedure: BALLOON DILATION;  Surgeon: Rush Landmark Telford Nab., MD;  Location: [place];  Service: Gastroenterology;  Laterality: N/A;  . BILIARY BRUSHING  08/10/2018   Procedure: BILIARY BRUSHING;  Surgeon: Rush Landmark Telford Nab., MD;  Location: [place];  Service: Gastroenterology;;  . BILIARY BRUSHING  11/30/2018   Procedure: BILIARY BRUSHING;  Surgeon: Irving Copas., MD;  Location: [place];  Service: Gastroenterology;;  . BILIARY DILATION  08/10/2018   Procedure: BILIARY DILATION;  Surgeon: Irving Copas., MD;  Location: [place];  Service: Gastroenterology;;  . BILIARY DILATION  08/27/2018   Procedure: BILIARY DILATION;  Surgeon: Irving Copas., MD;  Location: [place];  Service:  Gastroenterology;;  . BILIARY DILATION  01/24/2020   Procedure: BILIARY DILATION;  Surgeon: Irving Copas., MD;  Location: [place];  Service: Gastroenterology;;  . BILIARY STENT PLACEMENT  08/10/2018   Procedure: BILIARY STENT PLACEMENT;  Surgeon: Irving Copas., MD;  Location: [place];  Service: Gastroenterology;;  . BILIARY STENT PLACEMENT  08/27/2018   Procedure: BILIARY STENT PLACEMENT;  Surgeon: Irving Copas., MD;  Location: [place];  Service: Gastroenterology;;  . BILIARY STENT PLACEMENT  11/30/2018   Procedure: BILIARY STENT PLACEMENT;  Surgeon: Irving Copas., MD;  Location: [place];  Service: Gastroenterology;;  . BIOPSY  07/23/2018   Procedure: BIOPSY;  Surgeon: Irving Copas., MD;  Location: [place];  Service: Gastroenterology;;  . BIOPSY  01/24/2020   Procedure: BIOPSY;  Surgeon: Irving Copas., MD;  Location: [place];  Service: Gastroenterology;;  . CHOLECYSTECTOMY N/A 09/02/2018   Procedure: LAPAROSCOPIC CHOLECYSTECTOMY;  Surgeon: Stark Klein, MD;  Location: [place];  Service: General;  Laterality: N/A;  . COLONOSCOPY     normal   . ENDOSCOPIC RETROGRADE CHOLANGIOPANCREATOGRAPHY (ERCP) WITH PROPOFOL N/A 08/10/2018   Procedure: ENDOSCOPIC RETROGRADE CHOLANGIOPANCREATOGRAPHY (ERCP) WITH PROPOFOL;  Surgeon: Irving Copas., MD;  Location: [place];  Service: Gastroenterology;  Laterality: N/A;  . ENDOSCOPIC RETROGRADE CHOLANGIOPANCREATOGRAPHY (ERCP) WITH PROPOFOL N/A 11/30/2018   Procedure: ENDOSCOPIC RETROGRADE CHOLANGIOPANCREATOGRAPHY (ERCP) WITH PROPOFOL;  Surgeon: Rush Landmark Telford Nab., MD;  Location: [place];  Service: Gastroenterology;  Laterality: N/A;  . ENDOSCOPIC RETROGRADE CHOLANGIOPANCREATOGRAPHY (ERCP) WITH PROPOFOL N/A 01/24/2020   Procedure: ENDOSCOPIC RETROGRADE CHOLANGIOPANCREATOGRAPHY (ERCP) WITH PROPOFOL;  Surgeon: Rush Landmark Telford Nab., MD;  Location: [place];   Service: Gastroenterology;  Laterality: N/A;  . ERCP N/A 07/23/2018   Procedure: ENDOSCOPIC RETROGRADE CHOLANGIOPANCREATOGRAPHY (ERCP);  Surgeon: Irving Copas., MD;  Location: [place];  Service: Gastroenterology;  Laterality: N/A;  . ERCP N/A 08/27/2018   Procedure: ENDOSCOPIC RETROGRADE CHOLANGIOPANCREATOGRAPHY (ERCP);  Surgeon: Irving Copas., MD;  Location: [place];  Service: Gastroenterology;  Laterality: N/A;  . ESOPHAGOGASTRODUODENOSCOPY N/A 01/24/2020   Procedure: ESOPHAGOGASTRODUODENOSCOPY (EGD);  Surgeon: Irving Copas., MD;  Location: [place];  Service: Gastroenterology;  Laterality: N/A;  . ESOPHAGOGASTRODUODENOSCOPY (EGD) WITH PROPOFOL N/A 08/27/2018   Procedure: ESOPHAGOGASTRODUODENOSCOPY (EGD) WITH PROPOFOL;  Surgeon: Rush Landmark Telford Nab., MD;  Location: [place];  Service: Gastroenterology;  Laterality: N/A;  . EUS N/A 08/27/2018   Procedure: ESOPHAGEAL ENDOSCOPIC ULTRASOUND (EUS) RADIAL;  Surgeon: Rush Landmark Telford Nab., MD;  Location: [place];  Service: Gastroenterology;  Laterality: N/A;  . FINE NEEDLE ASPIRATION  08/27/2018   Procedure: FINE NEEDLE ASPIRATION (FNA) LINEAR;  Surgeon: Irving Copas., MD;  Location: [place];  Service: Gastroenterology;;  . IR IMAGING GUIDED PORT INSERTION  01/08/2018  . LAPAROSCOPIC NEPHRECTOMY Left 08/01/2016   Procedure: LAPAROSCOPIC  RADICAL NEPHRECTOMY/ REPAIR OF UMBILICAL HERNIA;  Surgeon: Raynelle Bring, MD;  Location: [facility];  Service: Urology;  Laterality: Left;  . REMOVAL OF STONES  07/23/2018   Procedure: REMOVAL  OF GALL STONES;  Surgeon: Mansouraty, Telford Nab., MD;  Location: [place];  Service: Gastroenterology;;  . REMOVAL OF STONES  08/10/2018   Procedure: REMOVAL OF STONES;  Surgeon: Irving Copas., MD;  Location: [place];  Service: Gastroenterology;;  . REMOVAL OF STONES  08/27/2018   Procedure: REMOVAL OF STONES;  Surgeon: Irving Copas., MD;   Location: [place];  Service: Gastroenterology;;  . REMOVAL OF STONES  01/24/2020   Procedure: REMOVAL OF STONES;  Surgeon: Irving Copas., MD;  Location: [place];  Service: Gastroenterology;;  . [name]  07/23/2018   Procedure: [name];  Surgeon: Irving Copas., MD;  Location: [place];  Service: Gastroenterology;;  . Lavell Islam REMOVAL  08/27/2018   Procedure: STENT REMOVAL;  Surgeon: Irving Copas., MD;  Location: [place];  Service: Gastroenterology;;  . Lavell Islam REMOVAL  11/30/2018   Procedure: STENT REMOVAL;  Surgeon: Irving Copas., MD;  Location: [place];  Service: Gastroenterology;;  . Lavell Islam REMOVAL  01/24/2020   Procedure: STENT REMOVAL;  Surgeon: Irving Copas., MD;  Location: [place];  Service: Gastroenterology;;  . UPPER GI ENDOSCOPY     x 1  . VAGINAL PROLAPSE REPAIR  11/18/2019   [facility]    Current Medications: Current Meds  Medication Sig  . amLODipine (NORVASC) 5 MG tablet Take 5 mg by mouth daily.   Marland Kitchen atorvastatin (LIPITOR) 80 MG tablet Take 80 mg by mouth at bedtime.   . carvedilol (COREG) 6.25 MG tablet Take 6.25 mg by mouth 2 (two) times daily.  . cephALEXin (KEFLEX) 250 MG capsule Take 250 mg by mouth every Monday, Wednesday, and Friday.   . clobetasol ointment (TEMOVATE) 8.29 % Apply 1 application topically 2 (two) times daily as needed (lichen sclerosis).   . Denosumab (XGEVA [place]) Inject into the skin every 6 (six) weeks.  . diphenhydrAMINE (BENADRYL) 25 MG tablet Take 25 mg by mouth at bedtime as needed for sleep.  Marland Kitchen glimepiride (AMARYL) 1 MG tablet Take 1 mg by mouth daily with breakfast.  . hydrocortisone (CORTEF) 10 MG tablet Take 5-10 mg by mouth See admin instructions. Take 1 tablet (10 mg) by mouth in the morning & 1/2 tablet (5 mg) by mouth in the evening.  Marland Kitchen levothyroxine (SYNTHROID) 75 MCG tablet Take 75 mcg by mouth daily before breakfast.   . Lidocaine 4 % PTCH Apply 1 patch  topically daily as needed (pain).  Marland Kitchen lidocaine-prilocaine (EMLA) cream Apply 1 application topically daily as needed (prior to port access).  . Nivolumab (OPDIVO IV) Inject 1 Dose into the vein every 14 (fourteen) days. [place]  . omeprazole (PRILOSEC) 20 MG capsule Take 1 capsule (20 mg total) by mouth 2 (two) times daily before a meal.  . sitaGLIPtin (JANUVIA) 50 MG tablet Take 50 mg by mouth daily.     Allergies:   Adhesive [tape], Nsaids, and Statins   Social History   Socioeconomic History  . Marital status: Married    Spouse name: Not on file  . Number of children: 3  . Years of education: Not on file  . Highest education level: Not on file  Occupational History  . Occupation: retired  Tobacco Use  . Smoking status: Former Smoker    Packs/day: 1.00    Years: 8.00    Pack years: 8.00    Types: Cigarettes    Quit date: 06/18/1964    Years since quitting: 55.8  . Smokeless tobacco: Never Used  Vaping Use  .  Vaping Use: Never used  Substance and Sexual Activity  . Alcohol use: No  . Drug use: No  . Sexual activity: Not on file  Other Topics Concern  . Not on file  Social History Narrative   Married.  Three children   Social Determinants of Health   Financial Resource Strain:   . Difficulty of Paying Living Expenses: Not on file  Food Insecurity:   . Worried About Charity fundraiser in the Last Year: Not on file  . Ran Out of Food in the Last Year: Not on file  Transportation Needs:   . Lack of Transportation (Medical): Not on file  . Lack of Transportation (Non-Medical): Not on file  Physical Activity:   . Days of Exercise per Week: Not on file  . Minutes of Exercise per Session: Not on file  Stress:   . Feeling of Stress : Not on file  Social Connections:   . Frequency of Communication with Friends and Family: Not on file  . Frequency of Social Gatherings with Friends and Family: Not on file  . Attends Religious Services: Not on file  . Active  Member of Clubs or Organizations: Not on file  . Attends Archivist Meetings: Not on file  . Marital Status: Not on file     Family History: The patient's family history includes Heart attack (age of onset: 81) in her father; Heart disease (age of onset: 69) in her brother. There is no history of Colon cancer, Esophageal cancer, Inflammatory bowel disease, Liver disease, Pancreatic cancer, Rectal cancer, or Stomach cancer.  ROS:   Please see the history of present illness.     All other systems reviewed and are negative.  EKGs/Labs/Other Studies Reviewed:    The following studies were reviewed today:  Myoview 12/08/2017   EKG:  EKG is ordered today.  The ekg ordered today demonstrates normal sinus rhythm, T wave inversion in the lateral leads.  Recent Labs: 02/02/2020: TSH 1.022 03/15/2020: ALT 18; BUN 33; Creatinine 1.98; Hemoglobin 10.4; Platelets 264; Potassium 3.8; Sodium 142  Recent Lipid Panel No results found for: CHOL, TRIG, HDL, CHOLHDL, VLDL, LDLCALC, LDLDIRECT   Risk Assessment/Calculations:       Physical Exam:    VS:  BP 124/60   Pulse 75   Ht 4\' 11"  (1.499 m)   Wt 133 lb 12.8 oz (60.7 kg)   LMP  (LMP Unknown)   SpO2 98%   BMI 27.02 kg/m     Wt Readings from Last 3 Encounters:  03/23/20 133 lb 12.8 oz (60.7 kg)  03/15/20 133 lb (60.3 kg)  02/25/20 133 lb (60.3 kg)     GEN:  Well nourished, well developed in no acute distress HEENT: Normal NECK: No JVD; No carotid bruits LYMPHATICS: No lymphadenopathy CARDIAC: RRR, no murmurs, rubs, gallops RESPIRATORY:  Clear to auscultation without rales, wheezing or rhonchi  ABDOMEN: Soft, non-tender, non-distended MUSCULOSKELETAL:  No edema; No deformity  SKIN: Warm and dry NEUROLOGIC:  Alert and oriented x 3 PSYCHIATRIC:  Normal affect   ASSESSMENT:    1. Preop cardiovascular exam   2. Primary hypertension   3. Hyperlipidemia LDL goal <70   4. Coronary artery disease involving native coronary  artery of native heart without angina pectoris   5. Hypothyroidism, unspecified type   6. Renal cell carcinoma of left kidney (HCC)    PLAN:    In order of problems listed above:  1. Preoperative clearance prior to umbilical hernia repair  -  Patient was previously cleared for gallbladder surgery in April 2020.  Myoview obtained in August 2019 in [place] showed inferior wall scar with a small area of ischemia, EF borderline.  Echo demonstrating normal EF.   -Her functional ability is currently limited by back issue and the sciatica, however despite this, she is doing majority of the household work at home.  She cleans, cooks, and decorate by herself.  Her husband does the vacuuming.  She has no recent anginal symptoms.  EKG continue to show minimal T wave inversion in the lateral leads that is unchanged.  -I discussed her case with DOD Dr. Sallyanne Kuster, given lack of anginal symptom and her ability to go through multiple gallbladder procedures without significant cardiac complication, she is cleared to proceed with her umbilical hernia repair as well.  2. 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  Myoview in August 2019 showed inferior wall scar with small area of ischemia.  3. Hypertension: Blood pressure well controlled  4. Hyperlipidemia: Continue Lipitor  5. DM2: Managed by primary care provider  6. History of metastatic left renal cell carcinoma s/p left nephrectomy: Followed by [name] of oncology service, she is receiving chemotherapy every 2 weeks.    Shared Decision Making/Informed Consent        Medication Adjustments/Labs and Tests Ordered: Current medicines are reviewed at length with the patient today.  Concerns regarding medicines are outlined above.  Orders Placed This Encounter  Procedures  . EKG 12-Lead   No orders of the defined types were placed in this encounter.   Patient Instructions  Medication Instructions:  Your physician recommends  that you continue on your current medications as directed. Please refer to the Current Medication list given to you today.  *If you need a refill on your cardiac medications before your next appointment, please call your pharmacy*  Lab Work: NONE ordered at this time of appointment   If you have labs (blood work) drawn today and your tests are completely normal, you will receive your results only by: Marland Kitchen MyChart Message (if you have MyChart) OR . A paper copy in the mail If you have any lab test that is abnormal or we need to change your treatment, we will call you to review the results.  Testing/Procedures: NONE ordered at this time of appointment   Follow-Up: At [facility], you and your health needs are our priority.  As part of our continuing mission to provide you with exceptional heart care, we have created designated Provider Care Teams.  These Care Teams include your primary Cardiologist (physician) and Advanced Practice Providers (APPs -  Physician Assistants and Nurse Practitioners) who all work together to provide you with the care you need, when you need it.  Your next appointment:   Follow up as Needed    The format for your next appointment:   In Person  Provider:   You may see Minus Breeding, MD or one of the following Advanced Practice Providers on your designated Care Team:    Rosaria Ferries, PA-C  Jory Sims, DNP, ANP  Other Instructions      Signed, Almyra Deforest, [place]  03/25/2020 7:08 PM    [place]

## 2020-03-28 NOTE — Progress Notes (Signed)
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 She is here for C53D1 of Nivolumab. The patient's last visit with [name] was on 02/16/2020. The pt reports that she is doing well overall.  The pt reports that she has been eating well and gaining weight. Pt discontinued using Gabapentin as she does not feel that it was working for her. She has been cleared for her Umbilical Hernia Repair. She reports significant fatigue for 24 hrs after the [place] booster.   Lab results today (03/29/20) of CBC w/diff and CMP is as follows: all values are WNL except for RBC at 3.86, Hgb at 10.7, HCT at 33.6, Abs Immature Granulocytes at 0.11K, CO2 at 20, Glucose at 217, BUN at 27, Creatinine at 2.06, Albumin at 3.4, GFR Est at 24.  On review of systems, pt denies abdominal pain, bone pain, low appetite and any other symptoms.   MEDICAL HISTORY:   Past Medical History:  Diagnosis Date  . Anemia   . Arthritis    lower back, hips, hands  . Biliary stricture   . Diabetes mellitus ([place])    type 2   . Early cataracts, bilateral    Md just watching  . Elevated liver enzymes   . GERD (gastroesophageal reflux disease)    occasional - diet controlled  . History of blood transfusion 2018  . History of hiatal hernia   . HTN (hypertension)   . Hyperlipidemia   . Hypothyroidism   . left renal ca dx'd 2018   renal cancer - left kidney removed, pill chemo x 1 yr  . Myocardial infarction ([place]) 1991   no deficits  . SVD (spontaneous vaginal delivery)    x 3  .  Wears glasses    Dyslipidemia Osteoarthritis Ex-smoker Coronary artery disease Thyroid disorder-was apparently on levothyroxine 25 g daily which has subsequently been discontinued . Mitral regurgitation B12 deficiency  hiatal hernia with esophagitis   SURGICAL HISTORY: Past Surgical History:  Procedure Laterality Date  . BALLOON DILATION N/A 07/23/2018   Procedure: BALLOON DILATION;  Surgeon: Rush Landmark Telford Nab., MD;  Location: [place];  Service: Gastroenterology;  Laterality: N/A;  . BILIARY BRUSHING  08/10/2018   Procedure: BILIARY BRUSHING;  Surgeon: Rush Landmark Telford Nab., MD;  Location: [place];  Service: Gastroenterology;;  . BILIARY BRUSHING  11/30/2018   Procedure: BILIARY BRUSHING;  Surgeon: Irving Copas., MD;  Location: [place];  Service: Gastroenterology;;  . BILIARY DILATION  08/10/2018   Procedure: BILIARY DILATION;  Surgeon: Irving Copas., MD;  Location: [place];  Service: Gastroenterology;;  . BILIARY DILATION  08/27/2018   Procedure: BILIARY DILATION;  Surgeon: Irving Copas., MD;  Location: [place];  Service: Gastroenterology;;  . BILIARY DILATION  01/24/2020   Procedure: BILIARY DILATION;  Surgeon: Irving Copas., MD;  Location: [place];  Service: Gastroenterology;;  . BILIARY STENT PLACEMENT  08/10/2018   Procedure: BILIARY STENT PLACEMENT;  Surgeon: Irving Copas., MD;  Location: [place];  Service: Gastroenterology;;  . BILIARY STENT PLACEMENT  08/27/2018  Procedure: BILIARY STENT PLACEMENT;  Surgeon: Rush Landmark Telford Nab., MD;  Location: [place];  Service: Gastroenterology;;  . BILIARY STENT PLACEMENT  11/30/2018   Procedure: BILIARY STENT PLACEMENT;  Surgeon: Irving Copas., MD;  Location: [place];  Service: Gastroenterology;;  . BIOPSY  07/23/2018   Procedure: BIOPSY;  Surgeon: Irving Copas., MD;  Location: [place];  Service: Gastroenterology;;  .  BIOPSY  01/24/2020   Procedure: BIOPSY;  Surgeon: Irving Copas., MD;  Location: [place];  Service: Gastroenterology;;  . CHOLECYSTECTOMY N/A 09/02/2018   Procedure: LAPAROSCOPIC CHOLECYSTECTOMY;  Surgeon: Stark Klein, MD;  Location: [place];  Service: General;  Laterality: N/A;  . COLONOSCOPY     normal   . ENDOSCOPIC RETROGRADE CHOLANGIOPANCREATOGRAPHY (ERCP) WITH PROPOFOL N/A 08/10/2018   Procedure: ENDOSCOPIC RETROGRADE CHOLANGIOPANCREATOGRAPHY (ERCP) WITH PROPOFOL;  Surgeon: Irving Copas., MD;  Location: [place];  Service: Gastroenterology;  Laterality: N/A;  . ENDOSCOPIC RETROGRADE CHOLANGIOPANCREATOGRAPHY (ERCP) WITH PROPOFOL N/A 11/30/2018   Procedure: ENDOSCOPIC RETROGRADE CHOLANGIOPANCREATOGRAPHY (ERCP) WITH PROPOFOL;  Surgeon: Rush Landmark Telford Nab., MD;  Location: [place];  Service: Gastroenterology;  Laterality: N/A;  . ENDOSCOPIC RETROGRADE CHOLANGIOPANCREATOGRAPHY (ERCP) WITH PROPOFOL N/A 01/24/2020   Procedure: ENDOSCOPIC RETROGRADE CHOLANGIOPANCREATOGRAPHY (ERCP) WITH PROPOFOL;  Surgeon: Rush Landmark Telford Nab., MD;  Location: [place];  Service: Gastroenterology;  Laterality: N/A;  . ERCP N/A 07/23/2018   Procedure: ENDOSCOPIC RETROGRADE CHOLANGIOPANCREATOGRAPHY (ERCP);  Surgeon: Irving Copas., MD;  Location: [place];  Service: Gastroenterology;  Laterality: N/A;  . ERCP N/A 08/27/2018   Procedure: ENDOSCOPIC RETROGRADE CHOLANGIOPANCREATOGRAPHY (ERCP);  Surgeon: Irving Copas., MD;  Location: [place];  Service: Gastroenterology;  Laterality: N/A;  . ESOPHAGOGASTRODUODENOSCOPY N/A 01/24/2020   Procedure: ESOPHAGOGASTRODUODENOSCOPY (EGD);  Surgeon: Irving Copas., MD;  Location: [place];  Service: Gastroenterology;  Laterality: N/A;  . ESOPHAGOGASTRODUODENOSCOPY (EGD) WITH PROPOFOL N/A 08/27/2018   Procedure: ESOPHAGOGASTRODUODENOSCOPY (EGD) WITH PROPOFOL;  Surgeon: Rush Landmark Telford Nab., MD;  Location: [place];  Service: Gastroenterology;  Laterality: N/A;  . EUS N/A 08/27/2018   Procedure: ESOPHAGEAL ENDOSCOPIC ULTRASOUND (EUS) RADIAL;  Surgeon: Rush Landmark Telford Nab., MD;  Location: [place];  Service: Gastroenterology;  Laterality: N/A;  . FINE NEEDLE ASPIRATION  08/27/2018   Procedure: FINE NEEDLE ASPIRATION (FNA) LINEAR;  Surgeon: Irving Copas., MD;  Location: [place];  Service: Gastroenterology;;  . IR IMAGING GUIDED PORT INSERTION  01/08/2018  . LAPAROSCOPIC NEPHRECTOMY Left 08/01/2016   Procedure: LAPAROSCOPIC  RADICAL NEPHRECTOMY/ REPAIR OF UMBILICAL HERNIA;  Surgeon: Raynelle Bring, MD;  Location: [facility];  Service: Urology;  Laterality: Left;  . REMOVAL OF STONES  07/23/2018   Procedure: REMOVAL OF GALL STONES;  Surgeon: Rush Landmark Telford Nab., MD;  Location: [place];  Service: Gastroenterology;;  . REMOVAL OF STONES  08/10/2018   Procedure: REMOVAL OF STONES;  Surgeon: Irving Copas., MD;  Location: [place];  Service: Gastroenterology;;  . REMOVAL OF STONES  08/27/2018   Procedure: REMOVAL OF STONES;  Surgeon: Irving Copas., MD;  Location: [place];  Service: Gastroenterology;;  . REMOVAL OF STONES  01/24/2020   Procedure: REMOVAL OF STONES;  Surgeon: Irving Copas., MD;  Location: [place];  Service: Gastroenterology;;  . [name]  07/23/2018   Procedure: [name];  Surgeon: Irving Copas., MD;  Location: [place];  Service: Gastroenterology;;  . Lavell Islam REMOVAL  08/27/2018   Procedure: STENT REMOVAL;  Surgeon: Irving Copas., MD;  Location: [place];  Service: Gastroenterology;;  . Lavell Islam REMOVAL  11/30/2018   Procedure: Lavell Islam  REMOVAL;  Surgeon: Irving Copas., MD;  Location: [place];  Service: Gastroenterology;;  . Lavell Islam REMOVAL  01/24/2020   Procedure: STENT REMOVAL;  Surgeon: Irving Copas., MD;  Location: [place];  Service: Gastroenterology;;  . UPPER GI  ENDOSCOPY     x 1  . VAGINAL PROLAPSE REPAIR  11/18/2019   [facility]   No reported past surgeries EGD 05/01/2016 Dr. Earley Brooke Colonoscopy 03/2016 Dr. Earley Brooke  SOCIAL HISTORY: Social History   Socioeconomic History  . Marital status: Married    Spouse name: Not on file  . Number of children: 3  . Years of education: Not on file  . Highest education level: Not on file  Occupational History  . Occupation: retired  Tobacco Use  . Smoking status: Former Smoker    Packs/day: 1.00    Years: 8.00    Pack years: 8.00    Types: Cigarettes    Quit date: 06/18/1964    Years since quitting: 55.8  . Smokeless tobacco: Never Used  Vaping Use  . Vaping Use: Never used  Substance and Sexual Activity  . Alcohol use: No  . Drug use: No  . Sexual activity: Not on file  Other Topics Concern  . Not on file  Social History Narrative   Married.  Three children   Social Determinants of Health   Financial Resource Strain:   . Difficulty of Paying Living Expenses: Not on file  Food Insecurity:   . Worried About Charity fundraiser in the Last Year: Not on file  . Ran Out of Food in the Last Year: Not on file  Transportation Needs:   . Lack of Transportation (Medical): Not on file  . Lack of Transportation (Non-Medical): Not on file  Physical Activity:   . Days of Exercise per Week: Not on file  . Minutes of Exercise per Session: Not on file  Stress:   . Feeling of Stress : Not on file  Social Connections:   . Frequency of Communication with Friends and Family: Not on file  . Frequency of Social Gatherings with Friends and Family: Not on file  . Attends Religious Services: Not on file  . Active Member of Clubs or Organizations: Not on file  . Attends Archivist Meetings: Not on file  . Marital Status: Not on file  Intimate Partner Violence:   . Fear of Current or Ex-Partner: Not on file  . Emotionally Abused: Not on file  . Physically Abused: Not on file  . Sexually Abused:  Not on file   Patient lives in [place] Works as a Solicitor part-time Ex-smoker quit long time ago In 1961 . Smoked 2 packs per day for 8 years prior to that .  or a lot of stress since her daughter is also getting treated for stage IV uterine cancer.  FAMILY HISTORY:  Mother deceased Father with asthma and emphysema died at 75 years with the MI. Brother deceased of heart disease   ALLERGIES:  is allergic to adhesive [tape], nsaids, and statins.  MEDICATIONS:  Current Outpatient Medications  Medication Sig Dispense Refill  . amLODipine (NORVASC) 5 MG tablet Take 5 mg by mouth daily.   6  . atorvastatin (LIPITOR) 80 MG tablet Take 80 mg by mouth at bedtime.     . carvedilol (COREG) 6.25 MG tablet Take 6.25 mg by mouth 2 (two) times daily.  6  . cephALEXin (KEFLEX) 250 MG capsule Take 250 mg by mouth every  Monday, Wednesday, and Friday.     . clobetasol ointment (TEMOVATE) 9.16 % Apply 1 application topically 2 (two) times daily as needed (lichen sclerosis).     . Denosumab (XGEVA [place]) Inject into the skin every 6 (six) weeks.    . diphenhydrAMINE (BENADRYL) 25 MG tablet Take 25 mg by mouth at bedtime as needed for sleep.    Marland Kitchen glimepiride (AMARYL) 1 MG tablet Take 1 mg by mouth daily with breakfast.    . hydrocortisone (CORTEF) 10 MG tablet Take 5-10 mg by mouth See admin instructions. Take 1 tablet (10 mg) by mouth in the morning & 1/2 tablet (5 mg) by mouth in the evening.    Marland Kitchen levothyroxine (SYNTHROID) 75 MCG tablet Take 75 mcg by mouth daily before breakfast.     . Lidocaine 4 % PTCH Apply 1 patch topically daily as needed (pain).    Marland Kitchen lidocaine-prilocaine (EMLA) cream Apply 1 application topically daily as needed (prior to port access). 30 g 1  . Nivolumab (OPDIVO IV) Inject 1 Dose into the vein every 14 (fourteen) days. [place]    . omeprazole (PRILOSEC) 20 MG capsule Take 1 capsule (20 mg total) by mouth 2 (two) times daily before a meal. 60 capsule 3  .  sitaGLIPtin (JANUVIA) 50 MG tablet Take 50 mg by mouth daily.     No current facility-administered medications for this visit.   Facility-Administered Medications Ordered in Other Visits  Medication Dose Route Frequency Provider Last Rate Last Admin  . sodium chloride flush (NS) 0.9 % injection 10 mL  10 mL Intracatheter PRN Truitt Merle, MD   10 mL at 11/20/18 1232    REVIEW OF SYSTEMS:   A 10+ POINT REVIEW OF SYSTEMS WAS OBTAINED including neurology, dermatology, psychiatry, cardiac, respiratory, lymph, extremities, GI, GU, Musculoskeletal, constitutional, breasts, reproductive, HEENT.  All pertinent positives are noted in the HPI.  All others are negative.   PHYSICAL EXAMINATION:  ECOG FS:2 - Symptomatic, <50% confined to bed  Vitals:   03/29/20 0900  BP: 130/65  Pulse: 72  Resp: 14  Temp: 97.8 F (36.6 C)  SpO2: 100%   Wt Readings from Last 3 Encounters:  03/29/20 135 lb 3.2 oz (61.3 kg)  03/23/20 133 lb 12.8 oz (60.7 kg)  03/15/20 133 lb (60.3 kg)   Body mass index is 27.31 kg/m.    Exam was given in a chair   GENERAL:alert, in no acute distress and comfortable SKIN: no acute rashes, no significant lesions EYES: conjunctiva are pink and non-injected, sclera anicteric OROPHARYNX: MMM, no exudates, no oropharyngeal erythema or ulceration NECK: supple, no JVD LYMPH:  no palpable lymphadenopathy in the cervical, axillary or inguinal regions LUNGS: clear to auscultation b/l with normal respiratory effort HEART: regular rate & rhythm ABDOMEN:  normoactive bowel sounds , non tender, not distended. No palpable hepatosplenomegaly.  Extremity: no pedal edema PSYCH: alert & oriented x 3 with fluent speech NEURO: no focal motor/sensory deficits  LABORATORY DATA:  I have reviewed the data as listed  . CBC Latest Ref Rng & Units 03/29/2020 03/15/2020 03/01/2020  WBC 4.0 - 10.5 K/uL 7.2 8.0 10.0  Hemoglobin 12.0 - 15.0 g/dL 10.7(L) 10.4(L) 10.7(L)  Hematocrit 36 - 46 % 33.6(L)  32.5(L) 33.6(L)  Platelets 150 - 400 K/uL 266 264 379    CBC    Component Value Date/Time   WBC 7.2 03/29/2020 0837   RBC 3.86 (L) 03/29/2020 0837   HGB 10.7 (L) 03/29/2020 3846  HGB 11.4 (L) 09/11/2018 0900   HGB 11.2 (L) 04/04/2017 0830   HCT 33.6 (L) 03/29/2020 0837   HCT 35.2 04/04/2017 0830   PLT 266 03/29/2020 0837   PLT 277 09/11/2018 0900   PLT 250 04/04/2017 0830   MCV 87.0 03/29/2020 0837   MCV 107.6 (H) 04/04/2017 0830   MCH 27.7 03/29/2020 0837   MCHC 31.8 03/29/2020 0837   RDW 15.4 03/29/2020 0837   RDW 14.0 04/04/2017 0830   LYMPHSABS 1.3 03/29/2020 0837   LYMPHSABS 1.6 04/04/2017 0830   MONOABS 0.8 03/29/2020 0837   MONOABS 0.4 04/04/2017 0830   EOSABS 0.2 03/29/2020 0837   EOSABS 0.1 04/04/2017 0830   BASOSABS 0.1 03/29/2020 0837   BASOSABS 0.0 04/04/2017 0830     . CMP Latest Ref Rng & Units 03/29/2020 03/15/2020 03/01/2020  Glucose 70 - 99 mg/dL 217(H) 145(H) 150(H)  BUN 8 - 23 mg/dL 27(H) 33(H) 41(H)  Creatinine 0.44 - 1.00 mg/dL 2.06(H) 1.98(H) 2.19(H)  Sodium 135 - 145 mmol/L 138 142 140  Potassium 3.5 - 5.1 mmol/L 4.0 3.8 3.8  Chloride 98 - 111 mmol/L 111 116(H) 113(H)  CO2 22 - 32 mmol/L 20(L) 17(L) 19(L)  Calcium 8.9 - 10.3 mg/dL 9.2 8.3(L) 8.9  Total Protein 6.5 - 8.1 g/dL 6.5 6.5 6.7  Total Bilirubin 0.3 - 1.2 mg/dL 0.4 0.4 0.4  Alkaline Phos 38 - 126 U/L 75 67 70  AST 15 - 41 U/L 19 23 22   ALT 0 - 44 U/L 29 18 17    B12 level 299 (OSH)-->455  . Lab Results  Component Value Date   LDH 117 12/22/2019        08/27/18 Surgical Pathology from Cholecystectomy:         RADIOGRAPHIC STUDIES: I have personally reviewed the radiological images as listed and agreed with the findings in the report. Nm Pet Image Restag (ps) Skull Base To Thigh  Result Date: 01/03/2017 IMPRESSION: 1. There is a new lesion in the hepatic dome which is FDG avid and low in attenuation on CT imaging consistent with metastatic disease. Another region of  uptake in the left hepatic lobe posteriorly demonstrates no CT correlate. A second subtle metastasis is not excluded on today's study. An MRI could better assess for other hepatic metastases if clinically warranted. 2. New metastatic lesion in the left side of T8. 3. New pulmonary nodule in the right upper lobe. This is too small to characterize but suspicious. Recommend attention on follow-up. No FDG avid nodules or other enlarging nodules. 4. The uptake at the previous left renal artery has almost resolved in the interval and is favored to be post therapeutic. Recommend attention on follow-up. 5. The metastasis in the posterior right hilum seen previously has resolved. Electronically Signed   By: Dorise Bullion III M.D   On: 01/03/2017 11:16   .No results found.  ASSESSMENT & PLAN:    79 y.o. 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 PET/CT 06/19/2017 -- consistent with partial metabolic response to treatment.  Rt adrenal gland bx - showed clear cell RCC  S/p CYtoreductive left radical nephrectomy and left adrenal gland resection on 08/01/2016 by Dr Alinda Money.  10/16/17 PET/CT revealed Continued improvement, with the T7 metastatic lesion no longer significantly hypermetabolic. The previous right lower lobe pulmonary nodule is even less apparent, perhaps about 2 mm in diameter today; given that  this measured 6 mm on 03/28/2017 this probably represents an effectively treated metastatic lesion. Currently no appreciable hypermetabolic activity is identified to suggest active malignancy. Distended gallbladder with gallbladder wall thickening and gallstones. Correlate clinically in assessing for cholecystitis. Small but abnormal amount of free pelvic fluid, nonspecific. Other imaging findings of potential clinical significance: Chronic ethmoid sinusitis. Aortic Atherosclerosis.  Stable 5 mm right middle lobe pulmonary nodule, not hypermetabolic but below sensitive PET-CT size thresholds. Left nephrectomy. Notable pelvic floor laxity with cystocele. Chronic bilateral Sacroiliitis.  04/03/18 PET/CT revealed No evidence for new or progressive hypermetabolic disease on today's study to suggest metastatic progression. 2. Stable appearance of the T7 metastatic lesion without Hypermetabolism. 3. Tiny focus of FDG accumulation identified along the skin of the low right inguinal fold. No associated lesion evident on CT. This may be related to urinary contaminant. 4. Cholelithiasis with similar appearance of diffuse gallbladder wall thickening. 5. Stable 5 mm right middle lobe pulmonary nodule. 6.  Aortic Atherosclerois. 7. Diffuse colonic diverticulosis.   09/18/18 CT A/P revealed "Common duct stent in place. Improvement to resolution in biliary duct dilatation compared to 08/29/2018. Interval cholecystectomy without acute complication. 2. Left nephrectomy, without evidence of metastatic disease. 3.  Tiny hiatal hernia. 4. Coronary artery atherosclerosis. Aortic Atherosclerosis. 5. Mild limitations secondary to lack of IV contrast. 6. Marked pelvic floor laxity with cystocele and rectal prolapse".  07/02/2019 PET/CT ([id number]) which revealed "1. No evidence of hypermetabolic metastatic disease."  #2 b/l adrenal metastases from [place] s/p left adrenalectomy with adrenal insuff - follows with Dr Buddy Duty.  #3 Small pulmonary lesions -- 10/16/17 PET/CT showed pulmonary less apparent than 03/28/17 PET/CT, decreasing from 88mm to 75mm diameter.   MRI brain shows no evidence of metastatic disease  #4 T7/8 Bone metastases - received Xgeva every 4 weeks from May 2018 to June 2019. 10/16/17 PET/CT revealed improvement with T7 metastatic lesion no longer significantly hypermetabolic.  -on Marchelle Folks  #5 ?liver mets- rpt PET/CT from 10/16/2017 shows no overt evidence of metastatic disease in the  liver.  #6 Grade 1 Nausea - improved and intermittent. Hasnt used her anti-emetic as instructed so some nausea and decreased po intake.  #7 Grade 1 Diarrhea - resolved  #8 Hyponatremia -  resolved with sodium at 136 - likely related to some element of adrenal insufficiency, diarrhea, limited by mouth intake. Primarily solute free fluid intake.   #9 Acute on chronic renal insufficiency Creatine stable 1.44  #10 Hyperkalemia due to ACEI + RF- resolved  #11 Hemorrhoids -chronic with some bleeding --Recommended Sitz bath and OTC Anusol or Nupercaine for her hemorrhoid relief. -f/u with PCP for continued mx   #12 Moderate protein calorie malnutrition Weight has stabilized and improved Wt Readings from Last 3 Encounters:  03/29/20 135 lb 3.2 oz (61.3 kg)  03/23/20 133 lb 12.8 oz (60.7 kg)  03/15/20 133 lb (60.3 kg)   Plan: Continue healthy po intake/diabetic diet -Previously recommended the patient to drink atleast 48-64 oz of fluids daily -f/u with PCP/Cardiology for diuretics management  #13 Hypothyroidism/Adrenal insufficiency/Diabetes -Continue being followed by Dr. Buddy Duty  #14 HTN - ?control. Patient tends to be anxious and has higher blood pressures in the clinic. She can have increased blood pressure from Sutent as well. Plan: -continued close f/u with her PCP /cardiology regarding the many elements necessary for her care that are not directly related to her oncology care. -ACEI held due to AKI and hyperkalemia - following with cardiology to mx this. Has been started  on Amlodipine instead.  #15 Grade 1 mucositis- resolved -Advised the patient to continue use of Magic Mouthwash to aid with nutrition  #16 Newly diagnosed ?CAD - positive cardiac nuclear stress test Following with cardiology and nephrology   #17 h/o Choledocholithiasis and cholelithiasis 07/10/18 US Abdomen revealed Biliary duct dilatation with 8 mm calculus at the level of the ampulla in the distal common bile  duct. 2. Cholelithiasis. No gallbladder wall thickening or pericholecystic fluid. 3. Appearance of the liver raises concern for underlying hepatic cirrhosis. No focal liver lesions are demonstrable. 4.  Left kidney absent. 5.  Small cysts in right kidney. 6.  Aortic Atherosclerosis.  S/p ERCP on 07/23/18 with Dr. Valarie Merino Mansouraty  09/02/18 Cytopathology from ERCP was suspicious for malignant cells in bile duct, but was not definitive, other two findings were benign.  PLAN:   -Discussed pt labwork today, 03/29/20; blood counts are stable, Creatinine is upper limits of her nml, Glucose is elevated.  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 -No prohibitive toxicities from Nivolumab. We will continue current treatment plan with Nivolumab with same premedications. -Recommend pt receive the Shingles vaccine.  -Continue Xgeva every 6 weeks.  -Will see back in 6 weeks with labs   FOLLOW UP: -Plz schedule next 6 Nivolumab infusions q2weeks with portflush and labs -Continue Xgeva every 6 weeks please schedule next 4 treatments MD visit in 6 weeks   The total time spent in the appt was 30 minutes and more than 50% was on counseling and direct patient cares, ordering and mx of chemotherapy  All of the patient's questions were answered with apparent satisfaction. The patient knows to call the clinic with any problems, questions or concerns.   Sullivan Lone MD [place] AAHIVMS [facility] Hematology/Oncology Physician [facility]  (Office):       [phone] (Work cell):  [phone] (Fax):           [phone]  I, Yevette Edwards, am acting as a scribe for Dr. Sullivan Lone.   .I have reviewed the above documentation for accuracy and completeness, and I agree with the above. Brunetta Genera MD

## 2020-03-29 ENCOUNTER — Inpatient Hospital Stay: Payer: Medicare Other | Attending: Hematology

## 2020-03-29 ENCOUNTER — Other Ambulatory Visit: Payer: Self-pay

## 2020-03-29 ENCOUNTER — Inpatient Hospital Stay: Payer: Medicare Other

## 2020-03-29 ENCOUNTER — Inpatient Hospital Stay (HOSPITAL_BASED_OUTPATIENT_CLINIC_OR_DEPARTMENT_OTHER): Payer: Medicare Other | Admitting: Hematology

## 2020-03-29 VITALS — BP 130/65 | HR 72 | Temp 97.8°F | Resp 14 | Ht 59.0 in | Wt 135.2 lb

## 2020-03-29 DIAGNOSIS — I10 Essential (primary) hypertension: Secondary | ICD-10-CM | POA: Insufficient documentation

## 2020-03-29 DIAGNOSIS — Z95828 Presence of other vascular implants and grafts: Secondary | ICD-10-CM

## 2020-03-29 DIAGNOSIS — C7972 Secondary malignant neoplasm of left adrenal gland: Secondary | ICD-10-CM | POA: Diagnosis not present

## 2020-03-29 DIAGNOSIS — M129 Arthropathy, unspecified: Secondary | ICD-10-CM | POA: Diagnosis not present

## 2020-03-29 DIAGNOSIS — C642 Malignant neoplasm of left kidney, except renal pelvis: Secondary | ICD-10-CM | POA: Insufficient documentation

## 2020-03-29 DIAGNOSIS — Z23 Encounter for immunization: Secondary | ICD-10-CM | POA: Diagnosis not present

## 2020-03-29 DIAGNOSIS — D649 Anemia, unspecified: Secondary | ICD-10-CM | POA: Insufficient documentation

## 2020-03-29 DIAGNOSIS — E039 Hypothyroidism, unspecified: Secondary | ICD-10-CM | POA: Insufficient documentation

## 2020-03-29 DIAGNOSIS — Z5112 Encounter for antineoplastic immunotherapy: Secondary | ICD-10-CM | POA: Diagnosis present

## 2020-03-29 DIAGNOSIS — I252 Old myocardial infarction: Secondary | ICD-10-CM | POA: Insufficient documentation

## 2020-03-29 DIAGNOSIS — E119 Type 2 diabetes mellitus without complications: Secondary | ICD-10-CM | POA: Diagnosis not present

## 2020-03-29 DIAGNOSIS — E538 Deficiency of other specified B group vitamins: Secondary | ICD-10-CM | POA: Diagnosis not present

## 2020-03-29 DIAGNOSIS — K219 Gastro-esophageal reflux disease without esophagitis: Secondary | ICD-10-CM | POA: Diagnosis not present

## 2020-03-29 DIAGNOSIS — C7971 Secondary malignant neoplasm of right adrenal gland: Secondary | ICD-10-CM | POA: Diagnosis not present

## 2020-03-29 DIAGNOSIS — C7951 Secondary malignant neoplasm of bone: Secondary | ICD-10-CM

## 2020-03-29 DIAGNOSIS — C7801 Secondary malignant neoplasm of right lung: Secondary | ICD-10-CM | POA: Insufficient documentation

## 2020-03-29 DIAGNOSIS — K449 Diaphragmatic hernia without obstruction or gangrene: Secondary | ICD-10-CM | POA: Insufficient documentation

## 2020-03-29 DIAGNOSIS — K649 Unspecified hemorrhoids: Secondary | ICD-10-CM | POA: Insufficient documentation

## 2020-03-29 DIAGNOSIS — Z79899 Other long term (current) drug therapy: Secondary | ICD-10-CM | POA: Insufficient documentation

## 2020-03-29 DIAGNOSIS — E860 Dehydration: Secondary | ICD-10-CM | POA: Diagnosis not present

## 2020-03-29 DIAGNOSIS — E871 Hypo-osmolality and hyponatremia: Secondary | ICD-10-CM | POA: Insufficient documentation

## 2020-03-29 DIAGNOSIS — R110 Nausea: Secondary | ICD-10-CM | POA: Diagnosis not present

## 2020-03-29 DIAGNOSIS — R197 Diarrhea, unspecified: Secondary | ICD-10-CM | POA: Diagnosis not present

## 2020-03-29 DIAGNOSIS — I700 Atherosclerosis of aorta: Secondary | ICD-10-CM | POA: Insufficient documentation

## 2020-03-29 DIAGNOSIS — R748 Abnormal levels of other serum enzymes: Secondary | ICD-10-CM | POA: Diagnosis not present

## 2020-03-29 DIAGNOSIS — Z87891 Personal history of nicotine dependence: Secondary | ICD-10-CM | POA: Insufficient documentation

## 2020-03-29 DIAGNOSIS — Z7189 Other specified counseling: Secondary | ICD-10-CM

## 2020-03-29 DIAGNOSIS — E785 Hyperlipidemia, unspecified: Secondary | ICD-10-CM | POA: Diagnosis not present

## 2020-03-29 DIAGNOSIS — I2510 Atherosclerotic heart disease of native coronary artery without angina pectoris: Secondary | ICD-10-CM | POA: Diagnosis not present

## 2020-03-29 LAB — CBC WITH DIFFERENTIAL/PLATELET
Abs Immature Granulocytes: 0.11 10*3/uL — ABNORMAL HIGH (ref 0.00–0.07)
Basophils Absolute: 0.1 10*3/uL (ref 0.0–0.1)
Basophils Relative: 1 %
Eosinophils Absolute: 0.2 10*3/uL (ref 0.0–0.5)
Eosinophils Relative: 3 %
HCT: 33.6 % — ABNORMAL LOW (ref 36.0–46.0)
Hemoglobin: 10.7 g/dL — ABNORMAL LOW (ref 12.0–15.0)
Immature Granulocytes: 2 %
Lymphocytes Relative: 17 %
Lymphs Abs: 1.3 10*3/uL (ref 0.7–4.0)
MCH: 27.7 pg (ref 26.0–34.0)
MCHC: 31.8 g/dL (ref 30.0–36.0)
MCV: 87 fL (ref 80.0–100.0)
Monocytes Absolute: 0.8 10*3/uL (ref 0.1–1.0)
Monocytes Relative: 11 %
Neutro Abs: 4.8 10*3/uL (ref 1.7–7.7)
Neutrophils Relative %: 66 %
Platelets: 266 10*3/uL (ref 150–400)
RBC: 3.86 MIL/uL — ABNORMAL LOW (ref 3.87–5.11)
RDW: 15.4 % (ref 11.5–15.5)
WBC: 7.2 10*3/uL (ref 4.0–10.5)
nRBC: 0 % (ref 0.0–0.2)

## 2020-03-29 LAB — CMP (CANCER CENTER ONLY)
ALT: 29 U/L (ref 0–44)
AST: 19 U/L (ref 15–41)
Albumin: 3.4 g/dL — ABNORMAL LOW (ref 3.5–5.0)
Alkaline Phosphatase: 75 U/L (ref 38–126)
Anion gap: 7 (ref 5–15)
BUN: 27 mg/dL — ABNORMAL HIGH (ref 8–23)
CO2: 20 mmol/L — ABNORMAL LOW (ref 22–32)
Calcium: 9.2 mg/dL (ref 8.9–10.3)
Chloride: 111 mmol/L (ref 98–111)
Creatinine: 2.06 mg/dL — ABNORMAL HIGH (ref 0.44–1.00)
GFR, Estimated: 24 mL/min — ABNORMAL LOW (ref >60)
Glucose, Bld: 217 mg/dL — ABNORMAL HIGH (ref 70–99)
Potassium: 4 mmol/L (ref 3.5–5.1)
Sodium: 138 mmol/L (ref 135–145)
Total Bilirubin: 0.4 mg/dL (ref 0.3–1.2)
Total Protein: 6.5 g/dL (ref 6.5–8.1)

## 2020-03-29 MED ORDER — SODIUM CHLORIDE 0.9% FLUSH
10.0000 mL | Freq: Once | INTRAVENOUS | Status: AC
  Administered 2020-03-29: 10 mL
  Filled 2020-03-29: qty 10

## 2020-03-29 MED ORDER — SODIUM CHLORIDE 0.9 % IV SOLN
Freq: Once | INTRAVENOUS | Status: AC
  Filled 2020-03-29: qty 250

## 2020-03-29 MED ORDER — HEPARIN SOD (PORK) LOCK FLUSH 100 UNIT/ML IV SOLN
500.0000 [IU] | Freq: Once | INTRAVENOUS | Status: AC | PRN
  Administered 2020-03-29: 500 [IU]
  Filled 2020-03-29: qty 5

## 2020-03-29 MED ORDER — SODIUM CHLORIDE 0.9 % IV SOLN
240.0000 mg | Freq: Once | INTRAVENOUS | Status: AC
  Administered 2020-03-29: 240 mg via INTRAVENOUS
  Filled 2020-03-29: qty 24

## 2020-03-29 MED ORDER — SODIUM CHLORIDE 0.9% FLUSH
10.0000 mL | INTRAVENOUS | Status: DC | PRN
  Administered 2020-03-29: 10 mL
  Filled 2020-03-29: qty 10

## 2020-03-29 MED ORDER — DENOSUMAB 120 MG/1.7ML ~~LOC~~ SOLN
120.0000 mg | Freq: Once | SUBCUTANEOUS | Status: AC
  Administered 2020-03-29: 120 mg via SUBCUTANEOUS

## 2020-03-29 MED ORDER — DENOSUMAB 120 MG/1.7ML ~~LOC~~ SOLN
SUBCUTANEOUS | Status: AC
  Filled 2020-03-29: qty 1.7

## 2020-03-29 NOTE — Progress Notes (Signed)
Patient discharged in stable condition.

## 2020-03-29 NOTE — Patient Instructions (Signed)
[facility] Discharge Instructions for Patients Receiving Chemotherapy  Today you received the following chemotherapy agents :  Opdivo.  To help prevent nausea and vomiting after your treatment, we encourage you to take your nausea medication as prescribed.   If you develop nausea and vomiting that is not controlled by your nausea medication, call the clinic.   BELOW ARE SYMPTOMS THAT SHOULD BE REPORTED IMMEDIATELY:  *FEVER GREATER THAN 100.5 F  *CHILLS WITH OR WITHOUT FEVER  NAUSEA AND VOMITING THAT IS NOT CONTROLLED WITH YOUR NAUSEA MEDICATION  *UNUSUAL SHORTNESS OF BREATH  *UNUSUAL BRUISING OR BLEEDING  TENDERNESS IN MOUTH AND THROAT WITH OR WITHOUT PRESENCE OF ULCERS  *URINARY PROBLEMS  *BOWEL PROBLEMS  UNUSUAL RASH Items with * indicate a potential emergency and should be followed up as soon as possible.  Feel free to call the clinic should you have any questions or concerns. The clinic phone number is [phone].  Please show the CHEMO ALERT CARD at check-in to the Emergency Department and triage nurse.   

## 2020-03-29 NOTE — Progress Notes (Signed)
Verbal order per [name] - Madaline Brilliant to receive treatment today with creatinine 2.06

## 2020-04-12 ENCOUNTER — Inpatient Hospital Stay: Payer: Medicare Other

## 2020-04-12 ENCOUNTER — Other Ambulatory Visit: Payer: Self-pay

## 2020-04-12 VITALS — BP 136/64 | HR 69 | Temp 98.4°F | Resp 16

## 2020-04-12 DIAGNOSIS — Z7189 Other specified counseling: Secondary | ICD-10-CM

## 2020-04-12 DIAGNOSIS — Z5112 Encounter for antineoplastic immunotherapy: Secondary | ICD-10-CM

## 2020-04-12 DIAGNOSIS — C7951 Secondary malignant neoplasm of bone: Secondary | ICD-10-CM

## 2020-04-12 DIAGNOSIS — C642 Malignant neoplasm of left kidney, except renal pelvis: Secondary | ICD-10-CM

## 2020-04-12 DIAGNOSIS — Z95828 Presence of other vascular implants and grafts: Secondary | ICD-10-CM

## 2020-04-12 LAB — CBC WITH DIFFERENTIAL/PLATELET
Abs Immature Granulocytes: 0.07 10*3/uL (ref 0.00–0.07)
Basophils Absolute: 0.1 10*3/uL (ref 0.0–0.1)
Basophils Relative: 1 %
Eosinophils Absolute: 0.1 10*3/uL (ref 0.0–0.5)
Eosinophils Relative: 2 %
HCT: 35.2 % — ABNORMAL LOW (ref 36.0–46.0)
Hemoglobin: 11.2 g/dL — ABNORMAL LOW (ref 12.0–15.0)
Immature Granulocytes: 1 %
Lymphocytes Relative: 14 %
Lymphs Abs: 1.3 10*3/uL (ref 0.7–4.0)
MCH: 28 pg (ref 26.0–34.0)
MCHC: 31.8 g/dL (ref 30.0–36.0)
MCV: 88 fL (ref 80.0–100.0)
Monocytes Absolute: 0.9 10*3/uL (ref 0.1–1.0)
Monocytes Relative: 10 %
Neutro Abs: 6.9 10*3/uL (ref 1.7–7.7)
Neutrophils Relative %: 72 %
Platelets: 290 10*3/uL (ref 150–400)
RBC: 4 MIL/uL (ref 3.87–5.11)
RDW: 15.1 % (ref 11.5–15.5)
WBC: 9.4 10*3/uL (ref 4.0–10.5)
nRBC: 0 % (ref 0.0–0.2)

## 2020-04-12 LAB — CMP (CANCER CENTER ONLY)
ALT: 20 U/L (ref 0–44)
AST: 22 U/L (ref 15–41)
Albumin: 3.6 g/dL (ref 3.5–5.0)
Alkaline Phosphatase: 63 U/L (ref 38–126)
Anion gap: 9 (ref 5–15)
BUN: 29 mg/dL — ABNORMAL HIGH (ref 8–23)
CO2: 18 mmol/L — ABNORMAL LOW (ref 22–32)
Calcium: 8.9 mg/dL (ref 8.9–10.3)
Chloride: 112 mmol/L — ABNORMAL HIGH (ref 98–111)
Creatinine: 2.07 mg/dL — ABNORMAL HIGH (ref 0.44–1.00)
GFR, Estimated: 24 mL/min — ABNORMAL LOW (ref >60)
Glucose, Bld: 154 mg/dL — ABNORMAL HIGH (ref 70–99)
Potassium: 4.4 mmol/L (ref 3.5–5.1)
Sodium: 139 mmol/L (ref 135–145)
Total Bilirubin: 0.6 mg/dL (ref 0.3–1.2)
Total Protein: 6.8 g/dL (ref 6.5–8.1)

## 2020-04-12 MED ORDER — SODIUM CHLORIDE 0.9% FLUSH
10.0000 mL | Freq: Once | INTRAVENOUS | Status: AC
  Administered 2020-04-12: 10 mL
  Filled 2020-04-12: qty 10

## 2020-04-12 MED ORDER — SODIUM CHLORIDE 0.9 % IV SOLN
240.0000 mg | Freq: Once | INTRAVENOUS | Status: AC
  Administered 2020-04-12: 240 mg via INTRAVENOUS
  Filled 2020-04-12: qty 24

## 2020-04-12 MED ORDER — SODIUM CHLORIDE 0.9% FLUSH
10.0000 mL | INTRAVENOUS | Status: DC | PRN
  Administered 2020-04-12: 10 mL
  Filled 2020-04-12: qty 10

## 2020-04-12 MED ORDER — SODIUM CHLORIDE 0.9 % IV SOLN
Freq: Once | INTRAVENOUS | Status: DC
  Filled 2020-04-12: qty 250

## 2020-04-12 MED ORDER — HEPARIN SOD (PORK) LOCK FLUSH 100 UNIT/ML IV SOLN
500.0000 [IU] | Freq: Once | INTRAVENOUS | Status: AC | PRN
  Administered 2020-04-12: 500 [IU]
  Filled 2020-04-12: qty 5

## 2020-04-12 NOTE — Progress Notes (Signed)
Per Dr. Kale, ok to treat with elevated creatinine.  

## 2020-04-12 NOTE — Patient Instructions (Signed)

## 2020-04-12 NOTE — Patient Instructions (Signed)
[facility] Discharge Instructions for Patients Receiving Chemotherapy  Today you received the following chemotherapy agents: pembrolizumab.  To help prevent nausea and vomiting after your treatment, we encourage you to take your nausea medication as directed.   If you develop nausea and vomiting that is not controlled by your nausea medication, call the clinic.   BELOW ARE SYMPTOMS THAT SHOULD BE REPORTED IMMEDIATELY:  *FEVER GREATER THAN 100.5 F  *CHILLS WITH OR WITHOUT FEVER  NAUSEA AND VOMITING THAT IS NOT CONTROLLED WITH YOUR NAUSEA MEDICATION  *UNUSUAL SHORTNESS OF BREATH  *UNUSUAL BRUISING OR BLEEDING  TENDERNESS IN MOUTH AND THROAT WITH OR WITHOUT PRESENCE OF ULCERS  *URINARY PROBLEMS  *BOWEL PROBLEMS  UNUSUAL RASH Items with * indicate a potential emergency and should be followed up as soon as possible.  Feel free to call the clinic should you have any questions or concerns. The clinic phone number is [phone].  Please show the CHEMO ALERT CARD at check-in to the Emergency Department and triage nurse.   

## 2020-04-23 ENCOUNTER — Encounter: Payer: Self-pay | Admitting: Hematology

## 2020-04-24 ENCOUNTER — Other Ambulatory Visit: Payer: Self-pay | Admitting: Hematology

## 2020-04-26 ENCOUNTER — Inpatient Hospital Stay: Payer: Medicare Other

## 2020-04-26 ENCOUNTER — Other Ambulatory Visit: Payer: Self-pay | Admitting: Medical

## 2020-04-26 ENCOUNTER — Encounter: Payer: Self-pay | Admitting: Hematology

## 2020-04-26 ENCOUNTER — Inpatient Hospital Stay: Payer: Medicare Other | Attending: Hematology

## 2020-04-26 ENCOUNTER — Inpatient Hospital Stay (HOSPITAL_BASED_OUTPATIENT_CLINIC_OR_DEPARTMENT_OTHER): Payer: Medicare Other | Admitting: Medical

## 2020-04-26 ENCOUNTER — Other Ambulatory Visit: Payer: Self-pay

## 2020-04-26 VITALS — BP 131/61 | HR 63 | Temp 97.9°F | Resp 18

## 2020-04-26 DIAGNOSIS — C642 Malignant neoplasm of left kidney, except renal pelvis: Secondary | ICD-10-CM

## 2020-04-26 DIAGNOSIS — Z5112 Encounter for antineoplastic immunotherapy: Secondary | ICD-10-CM | POA: Insufficient documentation

## 2020-04-26 DIAGNOSIS — Z95828 Presence of other vascular implants and grafts: Secondary | ICD-10-CM

## 2020-04-26 DIAGNOSIS — Z79899 Other long term (current) drug therapy: Secondary | ICD-10-CM | POA: Diagnosis not present

## 2020-04-26 DIAGNOSIS — C7951 Secondary malignant neoplasm of bone: Secondary | ICD-10-CM

## 2020-04-26 DIAGNOSIS — M79674 Pain in right toe(s): Secondary | ICD-10-CM

## 2020-04-26 DIAGNOSIS — Z7189 Other specified counseling: Secondary | ICD-10-CM

## 2020-04-26 DIAGNOSIS — C649 Malignant neoplasm of unspecified kidney, except renal pelvis: Secondary | ICD-10-CM

## 2020-04-26 LAB — CBC WITH DIFFERENTIAL/PLATELET
Abs Immature Granulocytes: 0.12 10*3/uL — ABNORMAL HIGH (ref 0.00–0.07)
Basophils Absolute: 0.1 10*3/uL (ref 0.0–0.1)
Basophils Relative: 1 %
Eosinophils Absolute: 0.2 10*3/uL (ref 0.0–0.5)
Eosinophils Relative: 2 %
HCT: 36.3 % (ref 36.0–46.0)
Hemoglobin: 11.4 g/dL — ABNORMAL LOW (ref 12.0–15.0)
Immature Granulocytes: 1 %
Lymphocytes Relative: 13 %
Lymphs Abs: 1.3 10*3/uL (ref 0.7–4.0)
MCH: 27.3 pg (ref 26.0–34.0)
MCHC: 31.4 g/dL (ref 30.0–36.0)
MCV: 87.1 fL (ref 80.0–100.0)
Monocytes Absolute: 1 10*3/uL (ref 0.1–1.0)
Monocytes Relative: 10 %
Neutro Abs: 7.4 10*3/uL (ref 1.7–7.7)
Neutrophils Relative %: 73 %
Platelets: 296 10*3/uL (ref 150–400)
RBC: 4.17 MIL/uL (ref 3.87–5.11)
RDW: 15 % (ref 11.5–15.5)
WBC: 10.2 10*3/uL (ref 4.0–10.5)
nRBC: 0 % (ref 0.0–0.2)

## 2020-04-26 LAB — CMP (CANCER CENTER ONLY)
ALT: 20 U/L (ref 0–44)
AST: 21 U/L (ref 15–41)
Albumin: 3.5 g/dL (ref 3.5–5.0)
Alkaline Phosphatase: 63 U/L (ref 38–126)
Anion gap: 9 (ref 5–15)
BUN: 27 mg/dL — ABNORMAL HIGH (ref 8–23)
CO2: 18 mmol/L — ABNORMAL LOW (ref 22–32)
Calcium: 8.9 mg/dL (ref 8.9–10.3)
Chloride: 110 mmol/L (ref 98–111)
Creatinine: 2.18 mg/dL — ABNORMAL HIGH (ref 0.44–1.00)
GFR, Estimated: 23 mL/min — ABNORMAL LOW (ref >60)
Glucose, Bld: 153 mg/dL — ABNORMAL HIGH (ref 70–99)
Potassium: 4.1 mmol/L (ref 3.5–5.1)
Sodium: 137 mmol/L (ref 135–145)
Total Bilirubin: 0.5 mg/dL (ref 0.3–1.2)
Total Protein: 6.7 g/dL (ref 6.5–8.1)

## 2020-04-26 LAB — URIC ACID: Uric Acid, Serum: 5.5 mg/dL (ref 2.5–7.1)

## 2020-04-26 MED ORDER — HEPARIN SOD (PORK) LOCK FLUSH 100 UNIT/ML IV SOLN
500.0000 [IU] | Freq: Once | INTRAVENOUS | Status: AC | PRN
  Administered 2020-04-26: 500 [IU]
  Filled 2020-04-26: qty 5

## 2020-04-26 MED ORDER — AMOXICILLIN-POT CLAVULANATE 875-125 MG PO TABS: 1.0000 | ORAL_TABLET | Freq: Two times a day (BID) | ORAL | 0 refills | Status: DC

## 2020-04-26 MED ORDER — SODIUM CHLORIDE 0.9% FLUSH
10.0000 mL | Freq: Once | INTRAVENOUS | Status: AC
  Administered 2020-04-26: 10 mL
  Filled 2020-04-26: qty 10

## 2020-04-26 MED ORDER — SODIUM CHLORIDE 0.9 % IV SOLN
INTRAVENOUS | Status: AC
  Filled 2020-04-26 (×2): qty 250

## 2020-04-26 MED ORDER — SODIUM CHLORIDE 0.9 % IV SOLN
240.0000 mg | Freq: Once | INTRAVENOUS | Status: AC
  Administered 2020-04-26: 240 mg via INTRAVENOUS
  Filled 2020-04-26: qty 24

## 2020-04-26 MED ORDER — SODIUM CHLORIDE 0.9% FLUSH
10.0000 mL | INTRAVENOUS | Status: DC | PRN
  Administered 2020-04-26: 10 mL
  Filled 2020-04-26: qty 10

## 2020-04-26 NOTE — Progress Notes (Signed)
Per Dr. Candise Che: okay to treat with Scr of 2.18

## 2020-04-26 NOTE — Progress Notes (Signed)
Pr reports redness and pain in R toe. Pt can not get into see a pediatrist for 3 weeks. She is soaking toe in epsoms salts at home.  Adrienne Mocha PA notified. Uric acid added to specimen, which resulted WNL. Pt prescription for abx sent to patient's pharmacy per V. Tanner PA. Pt aware

## 2020-04-26 NOTE — Patient Instructions (Signed)

## 2020-04-28 NOTE — Progress Notes (Signed)
Ms. nanney was seen in the infusion room today when she reported that she was having pain in her right great toe.  She was concerned that it could be gout however a uric acid level returned within normal limits.  She was given a prescription for oral antibiotics and was treated as if this were an ingrown toenail with an infection.  Sandi Mealy, MHS, PA-C Physician Assistant

## 2020-05-09 NOTE — Progress Notes (Signed)
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 She is here for C53D1 of Nivolumab. The patient's last visit with [name] was on 03/29/2020. The pt reports that she is doing well overall.  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 She is no longer taking Gabapentin, as she claims it did not help. The pt notes no difference after getting off this medicine. This is most likely attributed to her Diabetes.  The pt also noted a recent ingrown toenail that was cut by her Podiatrist. This has since improved and she is able to wear shoes now. She has another appt later this month.  The pt expressed her desires to continue with hernia surgery. She wishes to get this done ASAP.  The pt noted an increased appetite. She has received her COVID booster. The pt notes that her daughter is not vaccinated and is very skeptical towards getting it.   Lab results today 05/10/2020 of CBC w/diff and CMP is as follows: all values are WNL except for Hgb at 10.9, HCT at 34.9, Abs Immature Granulocytes at 0.09K, CO2 at 20, Glucose at 168, BUN at 26, Creatinine at 2.01, Calcium at 8.6, Albumin at 3.4, GFR est at 25. 05/10/2020 TSH is in progress.  On review of systems, pt reports foot pain, itching and denies rashes, diarrhea, decreased appetite, unexpected weight loss, and any other symptoms.   MEDICAL HISTORY:   Past Medical History:  Diagnosis Date  . Anemia   . Arthritis    lower back, hips, hands  . Biliary stricture   . Diabetes mellitus  ([place])    type 2   . Early cataracts, bilateral    Md just watching  . Elevated liver enzymes   . GERD (gastroesophageal reflux disease)    occasional - diet controlled  . History of blood transfusion 2018  . History of hiatal hernia   . HTN (hypertension)   . Hyperlipidemia   . Hypothyroidism   . left renal ca dx'd 2018   renal cancer - left kidney removed, pill chemo x 1 yr  . Myocardial infarction ([place]) 1991   no deficits  . SVD (spontaneous vaginal delivery)    x 3  . Wears glasses    Dyslipidemia Osteoarthritis Ex-smoker Coronary artery disease Thyroid disorder-was apparently on levothyroxine 25 g daily which has subsequently been discontinued . Mitral regurgitation B12 deficiency  hiatal hernia with esophagitis   SURGICAL HISTORY: Past Surgical History:  Procedure Laterality Date  . BALLOON DILATION N/A 07/23/2018   Procedure: BALLOON DILATION;  Surgeon: Rush Landmark Telford Nab., MD;  Location: [place];  Service: Gastroenterology;  Laterality: N/A;  . BILIARY BRUSHING  08/10/2018   Procedure: BILIARY BRUSHING;  Surgeon: Rush Landmark Telford Nab., MD;  Location: [place];  Service: Gastroenterology;;  . BILIARY BRUSHING  11/30/2018   Procedure: BILIARY BRUSHING;  Surgeon: Irving Copas., MD;  Location: [place];  Service: Gastroenterology;;  . BILIARY DILATION  08/10/2018   Procedure: BILIARY DILATION;  Surgeon: Irving Copas., MD;  Location: [facility]  ENDOSCOPY;  Service: Gastroenterology;;  . BILIARY DILATION  08/27/2018   Procedure: BILIARY DILATION;  Surgeon: Lemar Lofty., MD;  Location: [facility] ENDOSCOPY;  Service: Gastroenterology;;  . BILIARY DILATION  01/24/2020   Procedure: BILIARY DILATION;  Surgeon: Lemar Lofty., MD;  Location: [facility] ENDOSCOPY;  Service: Gastroenterology;;  . BILIARY STENT PLACEMENT  08/10/2018   Procedure: BILIARY STENT PLACEMENT;  Surgeon: Lemar Lofty., MD;  Location: [facility] ENDOSCOPY;  Service:  Gastroenterology;;  . BILIARY STENT PLACEMENT  08/27/2018   Procedure: BILIARY STENT PLACEMENT;  Surgeon: Lemar Lofty., MD;  Location: [facility] ENDOSCOPY;  Service: Gastroenterology;;  . BILIARY STENT PLACEMENT  11/30/2018   Procedure: BILIARY STENT PLACEMENT;  Surgeon: Lemar Lofty., MD;  Location: [facility] ENDOSCOPY;  Service: Gastroenterology;;  . BIOPSY  07/23/2018   Procedure: BIOPSY;  Surgeon: Lemar Lofty., MD;  Location: [facility] ENDOSCOPY;  Service: Gastroenterology;;  . BIOPSY  01/24/2020   Procedure: BIOPSY;  Surgeon: Lemar Lofty., MD;  Location: [facility] ENDOSCOPY;  Service: Gastroenterology;;  . CHOLECYSTECTOMY N/A 09/02/2018   Procedure: LAPAROSCOPIC CHOLECYSTECTOMY;  Surgeon: Almond Lint, MD;  Location: [facility] OR;  Service: General;  Laterality: N/A;  . COLONOSCOPY     normal   . ENDOSCOPIC RETROGRADE CHOLANGIOPANCREATOGRAPHY (ERCP) WITH PROPOFOL N/A 08/10/2018   Procedure: ENDOSCOPIC RETROGRADE CHOLANGIOPANCREATOGRAPHY (ERCP) WITH PROPOFOL;  Surgeon: Lemar Lofty., MD;  Location: [facility] ENDOSCOPY;  Service: Gastroenterology;  Laterality: N/A;  . ENDOSCOPIC RETROGRADE CHOLANGIOPANCREATOGRAPHY (ERCP) WITH PROPOFOL N/A 11/30/2018   Procedure: ENDOSCOPIC RETROGRADE CHOLANGIOPANCREATOGRAPHY (ERCP) WITH PROPOFOL;  Surgeon: Meridee Score Netty Starring., MD;  Location: [facility] ENDOSCOPY;  Service: Gastroenterology;  Laterality: N/A;  . ENDOSCOPIC RETROGRADE CHOLANGIOPANCREATOGRAPHY (ERCP) WITH PROPOFOL N/A 01/24/2020   Procedure: ENDOSCOPIC RETROGRADE CHOLANGIOPANCREATOGRAPHY (ERCP) WITH PROPOFOL;  Surgeon: Meridee Score Netty Starring., MD;  Location: [facility] ENDOSCOPY;  Service: Gastroenterology;  Laterality: N/A;  . ERCP N/A 07/23/2018   Procedure: ENDOSCOPIC RETROGRADE CHOLANGIOPANCREATOGRAPHY (ERCP);  Surgeon: Lemar Lofty., MD;  Location: [facility] ENDOSCOPY;  Service: Gastroenterology;  Laterality: N/A;  . ERCP N/A 08/27/2018   Procedure: ENDOSCOPIC RETROGRADE CHOLANGIOPANCREATOGRAPHY  (ERCP);  Surgeon: Lemar Lofty., MD;  Location: [facility] ENDOSCOPY;  Service: Gastroenterology;  Laterality: N/A;  . ESOPHAGOGASTRODUODENOSCOPY N/A 01/24/2020   Procedure: ESOPHAGOGASTRODUODENOSCOPY (EGD);  Surgeon: Lemar Lofty., MD;  Location: [facility] ENDOSCOPY;  Service: Gastroenterology;  Laterality: N/A;  . ESOPHAGOGASTRODUODENOSCOPY (EGD) WITH PROPOFOL N/A 08/27/2018   Procedure: ESOPHAGOGASTRODUODENOSCOPY (EGD) WITH PROPOFOL;  Surgeon: Meridee Score Netty Starring., MD;  Location: [facility] ENDOSCOPY;  Service: Gastroenterology;  Laterality: N/A;  . EUS N/A 08/27/2018   Procedure: ESOPHAGEAL ENDOSCOPIC ULTRASOUND (EUS) RADIAL;  Surgeon: Meridee Score Netty Starring., MD;  Location: [facility] ENDOSCOPY;  Service: Gastroenterology;  Laterality: N/A;  . FINE NEEDLE ASPIRATION  08/27/2018   Procedure: FINE NEEDLE ASPIRATION (FNA) LINEAR;  Surgeon: Lemar Lofty., MD;  Location: [facility] ENDOSCOPY;  Service: Gastroenterology;;  . IR IMAGING GUIDED PORT INSERTION  01/08/2018  . LAPAROSCOPIC NEPHRECTOMY Left 08/01/2016   Procedure: LAPAROSCOPIC  RADICAL NEPHRECTOMY/ REPAIR OF UMBILICAL HERNIA;  Surgeon: Heloise Purpura, MD;  Location: [facility] ORS;  Service: Urology;  Laterality: Left;  . REMOVAL OF STONES  07/23/2018   Procedure: REMOVAL OF GALL STONES;  Surgeon: Meridee Score Netty Starring., MD;  Location: [facility] ENDOSCOPY;  Service: Gastroenterology;;  . REMOVAL OF STONES  08/10/2018   Procedure: REMOVAL OF STONES;  Surgeon: Lemar Lofty., MD;  Location: [facility] ENDOSCOPY;  Service: Gastroenterology;;  . REMOVAL OF STONES  08/27/2018   Procedure: REMOVAL OF STONES;  Surgeon: Lemar Lofty., MD;  Location:  [facility] ENDOSCOPY;  Service: Gastroenterology;;  . REMOVAL OF STONES  01/24/2020   Procedure: REMOVAL OF STONES;  Surgeon: Rush Landmark Telford Nab., MD;  Location: [place];  Service: Gastroenterology;;  . [name]  07/23/2018   Procedure: [name];  Surgeon: Irving Copas., MD;  Location: [place];   Service: Gastroenterology;;  . Lavell Islam REMOVAL  08/27/2018   Procedure: STENT REMOVAL;  Surgeon: Irving Copas., MD;  Location: [place];  Service: Gastroenterology;;  . Lavell Islam REMOVAL  11/30/2018   Procedure: STENT REMOVAL;  Surgeon: Irving Copas., MD;  Location: [place];  Service: Gastroenterology;;  . Lavell Islam REMOVAL  01/24/2020   Procedure: STENT REMOVAL;  Surgeon: Irving Copas., MD;  Location: [place];  Service: Gastroenterology;;  . UPPER GI ENDOSCOPY     x 1  . VAGINAL PROLAPSE REPAIR  11/18/2019   [facility]   No reported past surgeries EGD 05/01/2016 Dr. Earley Brooke Colonoscopy 03/2016 Dr. Earley Brooke  SOCIAL HISTORY: Social History   Socioeconomic History  . Marital status: Married    Spouse name: Not on file  . Number of children: 3  . Years of education: Not on file  . Highest education level: Not on file  Occupational History  . Occupation: retired  Tobacco Use  . Smoking status: Former Smoker    Packs/day: 1.00    Years: 8.00    Pack years: 8.00    Types: Cigarettes    Quit date: 06/18/1964    Years since quitting: 55.9  . Smokeless tobacco: Never Used  Vaping Use  . Vaping Use: Never used  Substance and Sexual Activity  . Alcohol use: No  . Drug use: No  . Sexual activity: Not on file  Other Topics Concern  . Not on file  Social History Narrative   Married.  Three children   Social Determinants of Health   Financial Resource Strain: Not on file  Food Insecurity: Not on file  Transportation Needs: Not on file  Physical Activity: Not on file  Stress: Not on file  Social Connections: Not on file  Intimate Partner Violence: Not on file   Patient lives in [place] Works as a Solicitor part-time Ex-smoker quit long time ago In 1961 . Smoked 2 packs per day for 8 years prior to that .  or a lot of stress since her daughter is also getting treated for stage IV uterine cancer.  FAMILY HISTORY:  Mother  deceased Father with asthma and emphysema died at 75 years with the MI. Brother deceased of heart disease   ALLERGIES:  is allergic to adhesive [tape], nsaids, and statins.  MEDICATIONS:  Current Outpatient Medications  Medication Sig Dispense Refill  . amLODipine (NORVASC) 5 MG tablet Take 5 mg by mouth daily.   6  . amoxicillin-clavulanate (AUGMENTIN) 875-125 MG tablet Take 1 tablet by mouth 2 (two) times daily. Please add yogurt to your diet or take a probiotic while on this antibiotic.  There is a risk of stomach upset and diarrhea. 14 tablet 0  . atorvastatin (LIPITOR) 80 MG tablet Take 80 mg by mouth at bedtime.     . carvedilol (COREG) 6.25 MG tablet Take 6.25 mg by mouth 2 (two) times daily.  6  . cephALEXin (KEFLEX) 250 MG capsule Take 250 mg by mouth every Monday, Wednesday, and Friday.     . clobetasol ointment (TEMOVATE) [id number] % Apply 1 application topically 2 (two) times daily as needed (lichen sclerosis).     . Denosumab (XGEVA  [place]) Inject into the skin every 6 (six) weeks.    . diphenhydrAMINE (BENADRYL) 25 MG tablet Take 25 mg by mouth at bedtime as needed for sleep.    Marland Kitchen glimepiride (AMARYL) 1 MG tablet Take 1 mg by mouth daily with breakfast. (Patient not taking: Reported on 03/29/2020)    . hydrocortisone (CORTEF) 10 MG tablet Take 5-10 mg by mouth See admin instructions. Take 1 tablet (10 mg) by mouth in the morning & 1/2 tablet (5 mg) by mouth in the evening.    Marland Kitchen levothyroxine (SYNTHROID) 75 MCG tablet Take 75 mcg by mouth daily before breakfast.     . Lidocaine 4 % PTCH Apply 1 patch topically daily as needed (pain).    Marland Kitchen lidocaine-prilocaine (EMLA) cream Apply 1 application topically daily as needed (prior to port access). 30 g 1  . Nivolumab (OPDIVO IV) Inject 1 Dose into the vein every 14 (fourteen) days. [place]    . omeprazole (PRILOSEC) 20 MG capsule Take 1 capsule (20 mg total) by mouth 2 (two) times daily before a meal. 60 capsule 3  . sitaGLIPtin  (JANUVIA) 50 MG tablet Take 50 mg by mouth daily.     No current facility-administered medications for this visit.   Facility-Administered Medications Ordered in Other Visits  Medication Dose Route Frequency Provider Last Rate Last Admin  . sodium chloride flush (NS) 0.9 % injection 10 mL  10 mL Intracatheter PRN Truitt Merle, MD   10 mL at 11/20/18 1232    REVIEW OF SYSTEMS:   10 Point review of Systems was done is negative except as noted above.  PHYSICAL EXAMINATION:  ECOG FS:2 - Symptomatic, <50% confined to bed  There were no vitals filed for this visit. Wt Readings from Last 3 Encounters:  03/29/20 135 lb 3.2 oz (61.3 kg)  03/23/20 133 lb 12.8 oz (60.7 kg)  03/15/20 133 lb (60.3 kg)   Body mass index is 26.64 kg/m.    Exam was given in a chair.   GENERAL:alert, in no acute distress and comfortable SKIN: no acute rashes, no significant lesions EYES: conjunctiva are pink and non-injected, sclera anicteric OROPHARYNX: MMM, no exudates, no oropharyngeal erythema or ulceration NECK: supple, no JVD LYMPH:  no palpable lymphadenopathy in the cervical, axillary or inguinal regions LUNGS: clear to auscultation b/l with normal respiratory effort HEART: regular rate & rhythm ABDOMEN:  normoactive bowel sounds , non tender, not distended. Extremity: no pedal edema PSYCH: alert & oriented x 3 with fluent speech NEURO: no focal motor/sensory deficits    LABORATORY DATA:  I have reviewed the data as listed  . CBC Latest Ref Rng & Units 04/26/2020 04/12/2020 03/29/2020  WBC 4.0 - 10.5 K/uL 10.2 9.4 7.2  Hemoglobin 12.0 - 15.0 g/dL 11.4(L) 11.2(L) 10.7(L)  Hematocrit 36.0 - 46.0 % 36.3 35.2(L) 33.6(L)  Platelets 150 - 400 K/uL 296 290 266    CBC    Component Value Date/Time   WBC 10.2 04/26/2020 0903   RBC 4.17 04/26/2020 0903   HGB 11.4 (L) 04/26/2020 0903   HGB 11.4 (L) 09/11/2018 0900   HGB 11.2 (L) 04/04/2017 0830   HCT 36.3 04/26/2020 0903   HCT 35.2 04/04/2017 0830    PLT 296 04/26/2020 0903   PLT 277 09/11/2018 0900   PLT 250 04/04/2017 0830   MCV 87.1 04/26/2020 0903   MCV 107.6 (H) 04/04/2017 0830   MCH 27.3 04/26/2020 0903   MCHC 31.4 04/26/2020 0903   RDW 15.0 04/26/2020  0903   RDW 14.0 04/04/2017 0830   LYMPHSABS 1.3 04/26/2020 0903   LYMPHSABS 1.6 04/04/2017 0830   MONOABS 1.0 04/26/2020 0903   MONOABS 0.4 04/04/2017 0830   EOSABS 0.2 04/26/2020 0903   EOSABS 0.1 04/04/2017 0830   BASOSABS 0.1 04/26/2020 0903   BASOSABS 0.0 04/04/2017 0830     . CMP Latest Ref Rng & Units 04/26/2020 04/12/2020 03/29/2020  Glucose 70 - 99 mg/dL 153(H) 154(H) 217(H)  BUN 8 - 23 mg/dL 27(H) 29(H) 27(H)  Creatinine 0.44 - 1.00 mg/dL 2.18(H) 2.07(H) 2.06(H)  Sodium 135 - 145 mmol/L 137 139 138  Potassium 3.5 - 5.1 mmol/L 4.1 4.4 4.0  Chloride 98 - 111 mmol/L 110 112(H) 111  CO2 22 - 32 mmol/L 18(L) 18(L) 20(L)  Calcium 8.9 - 10.3 mg/dL 8.9 8.9 9.2  Total Protein 6.5 - 8.1 g/dL 6.7 6.8 6.5  Total Bilirubin 0.3 - 1.2 mg/dL 0.5 0.6 0.4  Alkaline Phos 38 - 126 U/L 63 63 75  AST 15 - 41 U/L 21 22 19   ALT 0 - 44 U/L 20 20 29    B12 level 299 (OSH)-->455  . Lab Results  Component Value Date   LDH 117 12/22/2019        08/27/18 Surgical Pathology from Cholecystectomy:         RADIOGRAPHIC STUDIES: I have personally reviewed the radiological images as listed and agreed with the findings in the report. Nm Pet Image Restag (ps) Skull Base To Thigh  Result Date: 01/03/2017 IMPRESSION: 1. There is a new lesion in the hepatic dome which is FDG avid and low in attenuation on CT imaging consistent with metastatic disease. Another region of uptake in the left hepatic lobe posteriorly demonstrates no CT correlate. A second subtle metastasis is not excluded on today's study. An MRI could better assess for other hepatic metastases if clinically warranted. 2. New metastatic lesion in the left side of T8. 3. New pulmonary nodule in the right upper lobe. This  is too small to characterize but suspicious. Recommend attention on follow-up. No FDG avid nodules or other enlarging nodules. 4. The uptake at the previous left renal artery has almost resolved in the interval and is favored to be post therapeutic. Recommend attention on follow-up. 5. The metastasis in the posterior right hilum seen previously has resolved. Electronically Signed   By: Dorise Bullion III M.D   On: 01/03/2017 11:16   .No results found.  ASSESSMENT & PLAN:    80 y.o. 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 PET/CT 06/19/2017 -- consistent with partial metabolic response to treatment.  Rt adrenal gland bx - showed clear cell RCC  S/p CYtoreductive left radical nephrectomy and left adrenal gland resection on 08/01/2016 by Dr Alinda Money.  10/16/17 PET/CT revealed Continued improvement, with the T7 metastatic lesion no longer significantly hypermetabolic. The previous right lower lobe pulmonary nodule is even less apparent, perhaps about 2 mm in diameter today; given that this measured 6 mm on 03/28/2017 this probably represents an effectively treated metastatic lesion. Currently no appreciable hypermetabolic activity is identified to suggest active malignancy. Distended gallbladder with gallbladder wall thickening and gallstones. Correlate clinically in assessing for cholecystitis. Small but abnormal amount of free pelvic fluid, nonspecific. Other imaging findings of potential clinical significance: Chronic ethmoid sinusitis. Aortic Atherosclerosis. Stable 5 mm right middle lobe pulmonary nodule, not hypermetabolic but below sensitive  PET-CT size thresholds. Left nephrectomy. Notable pelvic floor laxity with cystocele. Chronic bilateral Sacroiliitis.  04/03/18 PET/CT revealed No evidence for new or progressive hypermetabolic disease on today's study to suggest  metastatic progression. 2. Stable appearance of the T7 metastatic lesion without Hypermetabolism. 3. Tiny focus of FDG accumulation identified along the skin of the low right inguinal fold. No associated lesion evident on CT. This may be related to urinary contaminant. 4. Cholelithiasis with similar appearance of diffuse gallbladder wall thickening. 5. Stable 5 mm right middle lobe pulmonary nodule. 6.  Aortic Atherosclerois. 7. Diffuse colonic diverticulosis.   09/18/18 CT A/P revealed "Common duct stent in place. Improvement to resolution in biliary duct dilatation compared to 08/29/2018. Interval cholecystectomy without acute complication. 2. Left nephrectomy, without evidence of metastatic disease. 3.  Tiny hiatal hernia. 4. Coronary artery atherosclerosis. Aortic Atherosclerosis. 5. Mild limitations secondary to lack of IV contrast. 6. Marked pelvic floor laxity with cystocele and rectal prolapse".  07/02/2019 PET/CT ([id number]) which revealed "1. No evidence of hypermetabolic metastatic disease."  #2 b/l adrenal metastases from [place] s/p left adrenalectomy with adrenal insuff - follows with Dr Buddy Duty.  #3 Small pulmonary lesions -- 10/16/17 PET/CT showed pulmonary less apparent than 03/28/17 PET/CT, decreasing from 47mm to 43mm diameter.   MRI brain shows no evidence of metastatic disease  #4 T7/8 Bone metastases - received Xgeva every 4 weeks from May 2018 to June 2019. 10/16/17 PET/CT revealed improvement with T7 metastatic lesion no longer significantly hypermetabolic.  -on Marchelle Folks  #5 ?liver mets- rpt PET/CT from 10/16/2017 shows no overt evidence of metastatic disease in the liver.  #6 Grade 1 Nausea - improved and intermittent. Hasnt used her anti-emetic as instructed so some nausea and decreased po intake.  #7 Grade 1 Diarrhea - resolved  #8 Hyponatremia -  resolved with sodium at 136 - likely related to some element of adrenal insufficiency, diarrhea, limited by mouth intake. Primarily  solute free fluid intake.   #9 Acute on chronic renal insufficiency Creatine stable 1.44  #10 Hyperkalemia due to ACEI + RF- resolved  #11 Hemorrhoids -chronic with some bleeding --Recommended Sitz bath and OTC Anusol or Nupercaine for her hemorrhoid relief. -f/u with PCP for continued mx   #12 Moderate protein calorie malnutrition Weight has stabilized and improved Wt Readings from Last 3 Encounters:  03/29/20 135 lb 3.2 oz (61.3 kg)  03/23/20 133 lb 12.8 oz (60.7 kg)  03/15/20 133 lb (60.3 kg)   Plan: Continue healthy po intake/diabetic diet -Previously recommended the patient to drink atleast 48-64 oz of fluids daily -f/u with PCP/Cardiology for diuretics management  #13 Hypothyroidism/Adrenal insufficiency/Diabetes -Continue being followed by Dr. Buddy Duty  #14 HTN - ?control. Patient tends to be anxious and has higher blood pressures in the clinic. She can have increased blood pressure from Sutent as well. Plan: -continued close f/u with her PCP /cardiology regarding the many elements necessary for her care that are not directly related to her oncology care. -ACEI held due to AKI and hyperkalemia - following with cardiology to mx this. Has been started on Amlodipine instead.  #15 Grade 1 mucositis- resolved -Advised the patient to continue use of Magic Mouthwash to aid with nutrition  #16 Newly diagnosed ?CAD - positive cardiac nuclear stress test Following with cardiology and nephrology   #17 h/o Choledocholithiasis and cholelithiasis 07/10/18 US Abdomen revealed Biliary duct dilatation with 8 mm calculus at the level of the ampulla in the distal common bile duct. 2. Cholelithiasis. No gallbladder wall  thickening or pericholecystic fluid. 3. Appearance of the liver raises concern for underlying hepatic cirrhosis. No focal liver lesions are demonstrable. 4.  Left kidney absent. 5.  Small cysts in right kidney. 6.  Aortic Atherosclerosis.  S/p ERCP on 07/23/18 with Dr. Valarie Merino  [place]  09/02/18 Cytopathology from ERCP was suspicious for malignant cells in bile duct, but was not definitive, other two findings were benign.  PLAN:   -Discussed labwork today, 05/10/2020; blood counts and chemistries remaining stable. Continue to monitor blood sugar levels due to Diabetes. -Recommend pt f/u with Podiatrist regarding foot pain and proper shoes -Recommend pt moisturize following shower to aid with itching. -Discussed the COVID booster and vaccine effect.  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 -No prohibitive toxicities from Nivolumab. We will continue current treatment plan with Nivolumab with same premedications.  -Continue Xgeva every 6 weeks.  -Will see back in 6 weeks with labs.  FOLLOW UP: -Plz schedule next 6 Nivolumab infusions q2weeks with portflush and labs -Continue Xgeva every 6 weeks please schedule next 4 treatments MD visit in 6 weeks    The total time spent in the appointment was 30 minutes and more than 50% was on counseling and direct patient cares, ordering and management of immunotherapy.   All of the patient's questions were answered with apparent satisfaction. The patient knows to call the clinic with any problems, questions or concerns.   Sullivan Lone MD [place] AAHIVMS [facility] Hematology/Oncology Physician [facility]  (Office):       [phone] (Work cell):  [phone] (Fax):           [phone]  Note completed by Reinaldo Raddle as medical scribe.   .I have reviewed the above documentation for accuracy and completeness, and I agree with the above. Brunetta Genera MD

## 2020-05-10 ENCOUNTER — Inpatient Hospital Stay: Payer: Medicare Other

## 2020-05-10 ENCOUNTER — Inpatient Hospital Stay (HOSPITAL_BASED_OUTPATIENT_CLINIC_OR_DEPARTMENT_OTHER): Payer: Medicare Other | Admitting: Hematology

## 2020-05-10 ENCOUNTER — Other Ambulatory Visit: Payer: Medicare Other

## 2020-05-10 ENCOUNTER — Other Ambulatory Visit: Payer: Self-pay

## 2020-05-10 ENCOUNTER — Ambulatory Visit: Payer: Medicare Other

## 2020-05-10 VITALS — BP 132/72 | HR 74 | Resp 18 | Ht 59.0 in | Wt 131.9 lb

## 2020-05-10 DIAGNOSIS — C649 Malignant neoplasm of unspecified kidney, except renal pelvis: Secondary | ICD-10-CM | POA: Diagnosis not present

## 2020-05-10 DIAGNOSIS — Z5112 Encounter for antineoplastic immunotherapy: Secondary | ICD-10-CM

## 2020-05-10 DIAGNOSIS — C7951 Secondary malignant neoplasm of bone: Secondary | ICD-10-CM

## 2020-05-10 DIAGNOSIS — C642 Malignant neoplasm of left kidney, except renal pelvis: Secondary | ICD-10-CM

## 2020-05-10 DIAGNOSIS — Z95828 Presence of other vascular implants and grafts: Secondary | ICD-10-CM

## 2020-05-10 DIAGNOSIS — Z7189 Other specified counseling: Secondary | ICD-10-CM

## 2020-05-10 LAB — CBC WITH DIFFERENTIAL/PLATELET
Abs Immature Granulocytes: 0.09 10*3/uL — ABNORMAL HIGH (ref 0.00–0.07)
Basophils Absolute: 0.1 10*3/uL (ref 0.0–0.1)
Basophils Relative: 1 %
Eosinophils Absolute: 0.1 10*3/uL (ref 0.0–0.5)
Eosinophils Relative: 2 %
HCT: 34.9 % — ABNORMAL LOW (ref 36.0–46.0)
Hemoglobin: 10.9 g/dL — ABNORMAL LOW (ref 12.0–15.0)
Immature Granulocytes: 1 %
Lymphocytes Relative: 14 %
Lymphs Abs: 1.2 10*3/uL (ref 0.7–4.0)
MCH: 27 pg (ref 26.0–34.0)
MCHC: 31.2 g/dL (ref 30.0–36.0)
MCV: 86.6 fL (ref 80.0–100.0)
Monocytes Absolute: 0.9 10*3/uL (ref 0.1–1.0)
Monocytes Relative: 10 %
Neutro Abs: 6.3 10*3/uL (ref 1.7–7.7)
Neutrophils Relative %: 72 %
Platelets: 257 10*3/uL (ref 150–400)
RBC: 4.03 MIL/uL (ref 3.87–5.11)
RDW: 14.8 % (ref 11.5–15.5)
WBC: 8.6 10*3/uL (ref 4.0–10.5)
nRBC: 0 % (ref 0.0–0.2)

## 2020-05-10 LAB — CMP (CANCER CENTER ONLY)
ALT: 26 U/L (ref 0–44)
AST: 26 U/L (ref 15–41)
Albumin: 3.4 g/dL — ABNORMAL LOW (ref 3.5–5.0)
Alkaline Phosphatase: 65 U/L (ref 38–126)
Anion gap: 9 (ref 5–15)
BUN: 26 mg/dL — ABNORMAL HIGH (ref 8–23)
CO2: 20 mmol/L — ABNORMAL LOW (ref 22–32)
Calcium: 8.6 mg/dL — ABNORMAL LOW (ref 8.9–10.3)
Chloride: 109 mmol/L (ref 98–111)
Creatinine: 2.01 mg/dL — ABNORMAL HIGH (ref 0.44–1.00)
GFR, Estimated: 25 mL/min — ABNORMAL LOW (ref >60)
Glucose, Bld: 168 mg/dL — ABNORMAL HIGH (ref 70–99)
Potassium: 4.1 mmol/L (ref 3.5–5.1)
Sodium: 138 mmol/L (ref 135–145)
Total Bilirubin: 0.3 mg/dL (ref 0.3–1.2)
Total Protein: 6.5 g/dL (ref 6.5–8.1)

## 2020-05-10 LAB — TSH: TSH: 0.656 u[IU]/mL (ref 0.308–3.960)

## 2020-05-10 MED ORDER — SODIUM CHLORIDE 0.9% FLUSH
10.0000 mL | INTRAVENOUS | Status: DC | PRN
  Administered 2020-05-10: 10 mL
  Filled 2020-05-10: qty 10

## 2020-05-10 MED ORDER — SODIUM CHLORIDE 0.9% FLUSH
10.0000 mL | Freq: Once | INTRAVENOUS | Status: AC
  Administered 2020-05-10: 10 mL
  Filled 2020-05-10: qty 10

## 2020-05-10 MED ORDER — HEPARIN SOD (PORK) LOCK FLUSH 100 UNIT/ML IV SOLN
500.0000 [IU] | Freq: Once | INTRAVENOUS | Status: AC | PRN
  Administered 2020-05-10: 500 [IU]
  Filled 2020-05-10: qty 5

## 2020-05-10 MED ORDER — DENOSUMAB 120 MG/1.7ML ~~LOC~~ SOLN
120.0000 mg | Freq: Once | SUBCUTANEOUS | Status: AC
  Administered 2020-05-10: 120 mg via SUBCUTANEOUS

## 2020-05-10 MED ORDER — SODIUM CHLORIDE 0.9 % IV SOLN
240.0000 mg | Freq: Once | INTRAVENOUS | Status: AC
  Administered 2020-05-10: 240 mg via INTRAVENOUS
  Filled 2020-05-10: qty 24

## 2020-05-10 MED ORDER — DENOSUMAB 120 MG/1.7ML ~~LOC~~ SOLN
SUBCUTANEOUS | Status: AC
  Filled 2020-05-10: qty 1.7

## 2020-05-10 MED ORDER — SODIUM CHLORIDE 0.9 % IV SOLN
INTRAVENOUS | Status: AC
  Filled 2020-05-10 (×2): qty 250

## 2020-05-10 NOTE — Patient Instructions (Signed)
[facility] Discharge Instructions for Patients Receiving Chemotherapy  Today you received the following chemotherapy agents: pembrolizumab.  To help prevent nausea and vomiting after your treatment, we encourage you to take your nausea medication as directed.   If you develop nausea and vomiting that is not controlled by your nausea medication, call the clinic.   BELOW ARE SYMPTOMS THAT SHOULD BE REPORTED IMMEDIATELY:  *FEVER GREATER THAN 100.5 F  *CHILLS WITH OR WITHOUT FEVER  NAUSEA AND VOMITING THAT IS NOT CONTROLLED WITH YOUR NAUSEA MEDICATION  *UNUSUAL SHORTNESS OF BREATH  *UNUSUAL BRUISING OR BLEEDING  TENDERNESS IN MOUTH AND THROAT WITH OR WITHOUT PRESENCE OF ULCERS  *URINARY PROBLEMS  *BOWEL PROBLEMS  UNUSUAL RASH Items with * indicate a potential emergency and should be followed up as soon as possible.  Feel free to call the clinic should you have any questions or concerns. The clinic phone number is [phone].  Please show the CHEMO ALERT CARD at check-in to the Emergency Department and triage nurse.   Denosumab injection What is this medicine? DENOSUMAB (den oh sue mab) slows bone breakdown. Prolia is used to treat osteoporosis in women after menopause and in men, and in people who are taking corticosteroids for 6 months or more. Xgeva is used to treat a high calcium level due to cancer and to prevent bone fractures and other bone problems caused by multiple myeloma or cancer bone metastases. Xgeva is also used to treat giant cell tumor of the bone. This medicine may be used for other purposes; ask your health care provider or pharmacist if you have questions. COMMON BRAND NAME(S): Prolia, XGEVA What should I tell my health care provider before I take this medicine? They need to know if you have any of these conditions:  dental disease  having surgery or tooth extraction  infection  kidney disease  low levels of calcium or Vitamin D in  the blood  malnutrition  on hemodialysis  skin conditions or sensitivity  thyroid or parathyroid disease  an unusual reaction to denosumab, other medicines, foods, dyes, or preservatives  pregnant or trying to get pregnant  breast-feeding How should I use this medicine? This medicine is for injection under the skin. It is given by a health care professional in a hospital or clinic setting. A special MedGuide will be given to you before each treatment. Be sure to read this information carefully each time. For Prolia, talk to your pediatrician regarding the use of this medicine in children. Special care may be needed. For Xgeva, talk to your pediatrician regarding the use of this medicine in children. While this drug may be prescribed for children as young as 13 years for selected conditions, precautions do apply. Overdosage: If you think you have taken too much of this medicine contact a poison control center or emergency room at once. NOTE: This medicine is only for you. Do not share this medicine with others. What if I miss a dose? It is important not to miss your dose. Call your doctor or health care professional if you are unable to keep an appointment. What may interact with this medicine? Do not take this medicine with any of the following medications:  other medicines containing denosumab This medicine may also interact with the following medications:  medicines that lower your chance of fighting infection  steroid medicines like prednisone or cortisone This list may not describe all possible interactions. Give your health care provider a list of all the medicines, herbs,   non-prescription drugs, or dietary supplements you use. Also tell them if you smoke, drink alcohol, or use illegal drugs. Some items may interact with your medicine. What should I watch for while using this medicine? Visit your doctor or health care professional for regular checks on your progress. Your  doctor or health care professional may order blood tests and other tests to see how you are doing. Call your doctor or health care professional for advice if you get a fever, chills or sore throat, or other symptoms of a cold or flu. Do not treat yourself. This drug may decrease your body's ability to fight infection. Try to avoid being around people who are sick. You should make sure you get enough calcium and vitamin D while you are taking this medicine, unless your doctor tells you not to. Discuss the foods you eat and the vitamins you take with your health care professional. See your dentist regularly. Brush and floss your teeth as directed. Before you have any dental work done, tell your dentist you are receiving this medicine. Do not become pregnant while taking this medicine or for 5 months after stopping it. Talk with your doctor or health care professional about your birth control options while taking this medicine. Women should inform their doctor if they wish to become pregnant or think they might be pregnant. There is a potential for serious side effects to an unborn child. Talk to your health care professional or pharmacist for more information. What side effects may I notice from receiving this medicine? Side effects that you should report to your doctor or health care professional as soon as possible:  allergic reactions like skin rash, itching or hives, swelling of the face, lips, or tongue  bone pain  breathing problems  dizziness  jaw pain, especially after dental work  redness, blistering, peeling of the skin  signs and symptoms of infection like fever or chills; cough; sore throat; pain or trouble passing urine  signs of low calcium like fast heartbeat, muscle cramps or muscle pain; pain, tingling, numbness in the hands or feet; seizures  unusual bleeding or bruising  unusually weak or tired Side effects that usually do not require medical attention (report to your  doctor or health care professional if they continue or are bothersome):  constipation  diarrhea  headache  joint pain  loss of appetite  muscle pain  runny nose  tiredness  upset stomach This list may not describe all possible side effects. Call your doctor for medical advice about side effects. You may report side effects to FDA at 1-800-FDA-1088. Where should I keep my medicine? This medicine is only given in a clinic, doctor's office, or other health care setting and will not be stored at home. NOTE: This sheet is a summary. It may not cover all possible information. If you have questions about this medicine, talk to your doctor, pharmacist, or health care provider.  2021 Elsevier/Gold Standard (2017-08-15 16:10:44)

## 2020-05-10 NOTE — Progress Notes (Signed)
Ok to treat with elevated Scr of 2.01 per Irene Limbo, MD

## 2020-05-24 ENCOUNTER — Other Ambulatory Visit: Payer: Medicare Other

## 2020-05-24 ENCOUNTER — Inpatient Hospital Stay: Payer: Medicare Other

## 2020-05-24 ENCOUNTER — Ambulatory Visit: Payer: Medicare Other

## 2020-05-24 ENCOUNTER — Other Ambulatory Visit: Payer: Self-pay

## 2020-05-24 ENCOUNTER — Inpatient Hospital Stay: Payer: Medicare Other | Attending: Hematology

## 2020-05-24 VITALS — BP 139/64 | HR 61 | Temp 97.8°F | Resp 18 | Wt 132.2 lb

## 2020-05-24 DIAGNOSIS — Z7189 Other specified counseling: Secondary | ICD-10-CM

## 2020-05-24 DIAGNOSIS — C642 Malignant neoplasm of left kidney, except renal pelvis: Secondary | ICD-10-CM

## 2020-05-24 DIAGNOSIS — C649 Malignant neoplasm of unspecified kidney, except renal pelvis: Secondary | ICD-10-CM | POA: Insufficient documentation

## 2020-05-24 DIAGNOSIS — C7951 Secondary malignant neoplasm of bone: Secondary | ICD-10-CM

## 2020-05-24 DIAGNOSIS — Z5112 Encounter for antineoplastic immunotherapy: Secondary | ICD-10-CM | POA: Insufficient documentation

## 2020-05-24 DIAGNOSIS — Z95828 Presence of other vascular implants and grafts: Secondary | ICD-10-CM

## 2020-05-24 LAB — CBC WITH DIFFERENTIAL/PLATELET
Abs Immature Granulocytes: 0.1 10*3/uL — ABNORMAL HIGH (ref 0.00–0.07)
Basophils Absolute: 0.1 10*3/uL (ref 0.0–0.1)
Basophils Relative: 1 %
Eosinophils Absolute: 0.2 10*3/uL (ref 0.0–0.5)
Eosinophils Relative: 2 %
HCT: 35.4 % — ABNORMAL LOW (ref 36.0–46.0)
Hemoglobin: 11.1 g/dL — ABNORMAL LOW (ref 12.0–15.0)
Immature Granulocytes: 1 %
Lymphocytes Relative: 15 %
Lymphs Abs: 1.2 10*3/uL (ref 0.7–4.0)
MCH: 27.1 pg (ref 26.0–34.0)
MCHC: 31.4 g/dL (ref 30.0–36.0)
MCV: 86.3 fL (ref 80.0–100.0)
Monocytes Absolute: 0.9 10*3/uL (ref 0.1–1.0)
Monocytes Relative: 11 %
Neutro Abs: 5.5 10*3/uL (ref 1.7–7.7)
Neutrophils Relative %: 70 %
Platelets: 285 10*3/uL (ref 150–400)
RBC: 4.1 MIL/uL (ref 3.87–5.11)
RDW: 14.5 % (ref 11.5–15.5)
WBC: 7.9 10*3/uL (ref 4.0–10.5)
nRBC: 0 % (ref 0.0–0.2)

## 2020-05-24 LAB — CMP (CANCER CENTER ONLY)
ALT: 33 U/L (ref 0–44)
AST: 30 U/L (ref 15–41)
Albumin: 3.6 g/dL (ref 3.5–5.0)
Alkaline Phosphatase: 74 U/L (ref 38–126)
Anion gap: 8 (ref 5–15)
BUN: 31 mg/dL — ABNORMAL HIGH (ref 8–23)
CO2: 19 mmol/L — ABNORMAL LOW (ref 22–32)
Calcium: 9 mg/dL (ref 8.9–10.3)
Chloride: 110 mmol/L (ref 98–111)
Creatinine: 2.03 mg/dL — ABNORMAL HIGH (ref 0.44–1.00)
GFR, Estimated: 25 mL/min — ABNORMAL LOW (ref >60)
Glucose, Bld: 163 mg/dL — ABNORMAL HIGH (ref 70–99)
Potassium: 4.1 mmol/L (ref 3.5–5.1)
Sodium: 137 mmol/L (ref 135–145)
Total Bilirubin: 0.4 mg/dL (ref 0.3–1.2)
Total Protein: 6.6 g/dL (ref 6.5–8.1)

## 2020-05-24 MED ORDER — SODIUM CHLORIDE 0.9 % IV SOLN
INTRAVENOUS | Status: AC
  Filled 2020-05-24 (×2): qty 250

## 2020-05-24 MED ORDER — HEPARIN SOD (PORK) LOCK FLUSH 100 UNIT/ML IV SOLN
500.0000 [IU] | Freq: Once | INTRAVENOUS | Status: AC | PRN
  Administered 2020-05-24: 500 [IU]
  Filled 2020-05-24: qty 5

## 2020-05-24 MED ORDER — SODIUM CHLORIDE 0.9% FLUSH
10.0000 mL | Freq: Once | INTRAVENOUS | Status: AC
  Administered 2020-05-24: 10 mL
  Filled 2020-05-24: qty 10

## 2020-05-24 MED ORDER — SODIUM CHLORIDE 0.9 % IV SOLN
240.0000 mg | Freq: Once | INTRAVENOUS | Status: AC
  Administered 2020-05-24: 240 mg via INTRAVENOUS
  Filled 2020-05-24: qty 24

## 2020-05-24 MED ORDER — SODIUM CHLORIDE 0.9% FLUSH
10.0000 mL | INTRAVENOUS | Status: DC | PRN
  Administered 2020-05-24: 10 mL
  Filled 2020-05-24: qty 10

## 2020-05-24 NOTE — Progress Notes (Signed)
Per [name], ok to treat with Scr 2.03.

## 2020-05-24 NOTE — Patient Instructions (Signed)

## 2020-05-24 NOTE — Patient Instructions (Signed)
[facility] Discharge Instructions for Patients Receiving Chemotherapy  Today you received the following chemotherapy agents: nivolumab.  To help prevent nausea and vomiting after your treatment, we encourage you to take your nausea medication as directed.   If you develop nausea and vomiting that is not controlled by your nausea medication, call the clinic.   BELOW ARE SYMPTOMS THAT SHOULD BE REPORTED IMMEDIATELY:  *FEVER GREATER THAN 100.5 F  *CHILLS WITH OR WITHOUT FEVER  NAUSEA AND VOMITING THAT IS NOT CONTROLLED WITH YOUR NAUSEA MEDICATION  *UNUSUAL SHORTNESS OF BREATH  *UNUSUAL BRUISING OR BLEEDING  TENDERNESS IN MOUTH AND THROAT WITH OR WITHOUT PRESENCE OF ULCERS  *URINARY PROBLEMS  *BOWEL PROBLEMS  UNUSUAL RASH Items with * indicate a potential emergency and should be followed up as soon as possible.  Feel free to call the clinic should you have any questions or concerns. The clinic phone number is [phone].  Please show the CHEMO ALERT CARD at check-in to the Emergency Department and triage nurse.   

## 2020-05-31 ENCOUNTER — Ambulatory Visit: Payer: Self-pay | Admitting: Surgery

## 2020-06-07 ENCOUNTER — Inpatient Hospital Stay: Payer: Medicare Other

## 2020-06-07 ENCOUNTER — Other Ambulatory Visit: Payer: Self-pay

## 2020-06-07 ENCOUNTER — Other Ambulatory Visit: Payer: Medicare Other

## 2020-06-07 ENCOUNTER — Ambulatory Visit: Payer: Medicare Other

## 2020-06-07 ENCOUNTER — Other Ambulatory Visit: Payer: Self-pay | Admitting: Hematology

## 2020-06-07 VITALS — BP 150/71 | HR 69 | Temp 98.0°F | Resp 18 | Wt 131.0 lb

## 2020-06-07 DIAGNOSIS — C642 Malignant neoplasm of left kidney, except renal pelvis: Secondary | ICD-10-CM

## 2020-06-07 DIAGNOSIS — Z5112 Encounter for antineoplastic immunotherapy: Secondary | ICD-10-CM | POA: Diagnosis not present

## 2020-06-07 DIAGNOSIS — Z7189 Other specified counseling: Secondary | ICD-10-CM

## 2020-06-07 DIAGNOSIS — C7951 Secondary malignant neoplasm of bone: Secondary | ICD-10-CM

## 2020-06-07 DIAGNOSIS — Z95828 Presence of other vascular implants and grafts: Secondary | ICD-10-CM

## 2020-06-07 LAB — CMP (CANCER CENTER ONLY)
ALT: 18 U/L (ref 0–44)
AST: 19 U/L (ref 15–41)
Albumin: 3.8 g/dL (ref 3.5–5.0)
Alkaline Phosphatase: 65 U/L (ref 38–126)
Anion gap: 8 (ref 5–15)
BUN: 38 mg/dL — ABNORMAL HIGH (ref 8–23)
CO2: 18 mmol/L — ABNORMAL LOW (ref 22–32)
Calcium: 9.3 mg/dL (ref 8.9–10.3)
Chloride: 107 mmol/L (ref 98–111)
Creatinine: 2.27 mg/dL — ABNORMAL HIGH (ref 0.44–1.00)
GFR, Estimated: 22 mL/min — ABNORMAL LOW (ref >60)
Glucose, Bld: 162 mg/dL — ABNORMAL HIGH (ref 70–99)
Potassium: 4.6 mmol/L (ref 3.5–5.1)
Sodium: 133 mmol/L — ABNORMAL LOW (ref 135–145)
Total Bilirubin: 0.4 mg/dL (ref 0.3–1.2)
Total Protein: 7 g/dL (ref 6.5–8.1)

## 2020-06-07 LAB — CBC WITH DIFFERENTIAL/PLATELET
Abs Immature Granulocytes: 0.11 10*3/uL — ABNORMAL HIGH (ref 0.00–0.07)
Basophils Absolute: 0.1 10*3/uL (ref 0.0–0.1)
Basophils Relative: 1 %
Eosinophils Absolute: 0.2 10*3/uL (ref 0.0–0.5)
Eosinophils Relative: 2 %
HCT: 36.9 % (ref 36.0–46.0)
Hemoglobin: 11.7 g/dL — ABNORMAL LOW (ref 12.0–15.0)
Immature Granulocytes: 1 %
Lymphocytes Relative: 13 %
Lymphs Abs: 1.3 10*3/uL (ref 0.7–4.0)
MCH: 27.1 pg (ref 26.0–34.0)
MCHC: 31.7 g/dL (ref 30.0–36.0)
MCV: 85.6 fL (ref 80.0–100.0)
Monocytes Absolute: 1 10*3/uL (ref 0.1–1.0)
Monocytes Relative: 10 %
Neutro Abs: 7 10*3/uL (ref 1.7–7.7)
Neutrophils Relative %: 73 %
Platelets: 320 10*3/uL (ref 150–400)
RBC: 4.31 MIL/uL (ref 3.87–5.11)
RDW: 14.4 % (ref 11.5–15.5)
WBC: 9.6 10*3/uL (ref 4.0–10.5)
nRBC: 0 % (ref 0.0–0.2)

## 2020-06-07 MED ORDER — SODIUM CHLORIDE 0.9 % IV SOLN
240.0000 mg | Freq: Once | INTRAVENOUS | Status: AC
  Administered 2020-06-07: 240 mg via INTRAVENOUS
  Filled 2020-06-07: qty 24

## 2020-06-07 MED ORDER — SODIUM CHLORIDE 0.9 % IV SOLN
INTRAVENOUS | Status: AC
  Filled 2020-06-07 (×2): qty 250

## 2020-06-07 MED ORDER — SODIUM CHLORIDE 0.9% FLUSH
10.0000 mL | Freq: Once | INTRAVENOUS | Status: AC
  Administered 2020-06-07: 10 mL
  Filled 2020-06-07: qty 10

## 2020-06-07 MED ORDER — HEPARIN SOD (PORK) LOCK FLUSH 100 UNIT/ML IV SOLN
500.0000 [IU] | Freq: Once | INTRAVENOUS | Status: AC | PRN
  Administered 2020-06-07: 500 [IU]
  Filled 2020-06-07: qty 5

## 2020-06-07 MED ORDER — SODIUM CHLORIDE 0.9% FLUSH
10.0000 mL | INTRAVENOUS | Status: DC | PRN
  Administered 2020-06-07: 10 mL
  Filled 2020-06-07: qty 10

## 2020-06-07 NOTE — Progress Notes (Signed)
Verbal order/[name]: Ok to receive D1,C58 Nivolumab today with Creatinine 2.27

## 2020-06-07 NOTE — Patient Instructions (Signed)
[facility] Discharge Instructions for Patients Receiving Chemotherapy  Today you received the following chemotherapy agents: nivolumab.  To help prevent nausea and vomiting after your treatment, we encourage you to take your nausea medication as directed.   If you develop nausea and vomiting that is not controlled by your nausea medication, call the clinic.   BELOW ARE SYMPTOMS THAT SHOULD BE REPORTED IMMEDIATELY:  *FEVER GREATER THAN 100.5 F  *CHILLS WITH OR WITHOUT FEVER  NAUSEA AND VOMITING THAT IS NOT CONTROLLED WITH YOUR NAUSEA MEDICATION  *UNUSUAL SHORTNESS OF BREATH  *UNUSUAL BRUISING OR BLEEDING  TENDERNESS IN MOUTH AND THROAT WITH OR WITHOUT PRESENCE OF ULCERS  *URINARY PROBLEMS  *BOWEL PROBLEMS  UNUSUAL RASH Items with * indicate a potential emergency and should be followed up as soon as possible.  Feel free to call the clinic should you have any questions or concerns. The clinic phone number is [phone].  Please show the CHEMO ALERT CARD at check-in to the Emergency Department and triage nurse.   

## 2020-06-12 ENCOUNTER — Encounter: Payer: Self-pay | Admitting: Hematology

## 2020-06-19 ENCOUNTER — Encounter (HOSPITAL_COMMUNITY): Payer: Self-pay

## 2020-06-19 NOTE — Patient Instructions (Addendum)
DUE TO COVID-19 ONLY TWO  VISITORS are  ALLOWED TO COME WITH YOU AND STAY IN THE WAITING ROOM ONLY DURING PRE OP AND PROCEDURE DAY OF SURGERY. TWO  VISITORS  MAY VISIT WITH YOU AFTER SURGERY IN YOUR PRIVATE ROOM DURING VISITING HOURS ONLY!  YOU NEED TO HAVE A COVID 19 TEST ON_3-1-22______ @__3 :05 pm_____, THIS TEST MUST BE DONE BEFORE SURGERY,  COVID TESTING SITE [place] [id number], IT IS ON THE RIGHT GOING OUT [address] APPROXIMATELY  2 MINUTES PAST [employer] ON THE RIGHT. ONCE YOUR COVID TEST IS COMPLETED,  PLEASE BEGIN THE QUARANTINE INSTRUCTIONS AS OUTLINED IN YOUR HANDOUT.                Audrey [name]  06/19/2020   Your procedure is scheduled on: 06-23-20   Report to [facility] Main  Entrance   Report to admitting at       0900  AM     Call this number if you have problems the morning of surgery [phone]    Remember: Do not eat food  :After Midnight.You may have clear liquids until 0800 am then nothing by mouth    CLEAR LIQUID DIET water                                                                   BLACK Coffee and tea, regular and decaf                              Plain Jell-O any favor except red or purple                                           Fruit ices (not with fruit pulp)                                     Iced Popsicles                                    Carbonated beverages, regular and diet                                    Cranberry, grape and apple juices Sports drinks like Gatorade Lightly seasoned clear broth or consume(fat free) Sugar, honey syrup   _____________________________________________________________________     BRUSH YOUR TEETH MORNING OF SURGERY AND RINSE YOUR MOUTH OUT, NO CHEWING GUM CANDY OR MINTS.     Take these medicines the morning of surgery with A SIP OF WATER: omeprazole, levothyroxine,carvedilol, am,lodipine, cortef  DO NOT TAKE ANY DIABETIC MEDICATIONS DAY OF YOUR  SURGERY                               You may not have any metal on your body including hair pins  and              piercings  Do not wear jewelry, make-up, lotions, powders or perfumes, deodorant             Do not wear nail polish on your fingernails.  Do not shave  48 hours prior to surgery.            Do not bring valuables to the hospital. [place].  Contacts, dentures or bridgework may not be worn into surgery.  Leave suitcase in the car. After surgery it may be brought to your room.     Patients discharged the day of surgery will not be allowed to drive home. IF YOU ARE HAVING SURGERY AND GOING HOME THE SAME DAY, YOU MUST HAVE AN ADULT TO DRIVE YOU HOME AND BE WITH YOU FOR 24 HOURS. YOU MAY GO HOME BY TAXI OR UBER OR ORTHERWISE, BUT AN ADULT MUST ACCOMPANY YOU HOME AND STAY WITH YOU FOR 24 HOURS.  Name and phone number of your driver:  Special Instructions: N/A              Please read over the following fact sheets you were given: _____________________________________________________________________             [facility] - Preparing for Surgery Before surgery, you can play an important role.  Because skin is not sterile, your skin needs to be as free of germs as possible.  You can reduce the number of germs on your skin by washing with CHG (chlorahexidine gluconate) soap before surgery.  CHG is an antiseptic cleaner which kills germs and bonds with the skin to continue killing germs even after washing. Please DO NOT use if you have an allergy to CHG or antibacterial soaps.  If your skin becomes reddened/irritated stop using the CHG and inform your nurse when you arrive at Short Stay. Do not shave (including legs and underarms) for at least 48 hours prior to the first CHG shower.  You may shave your face/neck. Please follow these instructions carefully:  1.  Shower with CHG Soap the night before surgery and the  morning of  Surgery.  2.  If you choose to wash your hair, wash your hair first as usual with your  normal  shampoo.  3.  After you shampoo, rinse your hair and body thoroughly to remove the  shampoo.                           4.  Use CHG as you would any other liquid soap.  You can apply chg directly  to the skin and wash                       Gently with a scrungie or clean washcloth.  5.  Apply the CHG Soap to your body ONLY FROM THE NECK DOWN.   Do not use on face/ open                           Wound or open sores. Avoid contact with eyes, ears mouth and genitals (private parts).                       Wash face,  Genitals (private parts) with your  normal soap.             6.  Wash thoroughly, paying special attention to the area where your surgery  will be performed.  7.  Thoroughly rinse your body with warm water from the neck down.  8.  DO NOT shower/wash with your normal soap after using and rinsing off  the CHG Soap.                9.  Pat yourself dry with a clean towel.            10.  Wear clean pajamas.            11.  Place clean sheets on your bed the night of your first shower and do not  sleep with pets. Day of Surgery : Do not apply any lotions/deodorants the morning of surgery.  Please wear clean clothes to the hospital/surgery center.  FAILURE TO FOLLOW THESE INSTRUCTIONS MAY RESULT IN THE CANCELLATION OF YOUR SURGERY PATIENT SIGNATURE_________________________________  NURSE SIGNATURE__________________________________  ________________________________________________________________________

## 2020-06-19 NOTE — Progress Notes (Addendum)
PCP - [name], MD Cardiologist - clearance Almyra Deforest, [place] 03-23-20 epic  Dr. Jenkins Rouge  PPM/ICD -  Device Orders -  Rep Notified -   Chest x-ray -  EKG - 03-23-20 epic Stress Test -  ECHO -  Cardiac Cath -  CBC/DIFF, CMP, hgba1c 06-21-20 epic, CBG 144 on CMP pt. 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 2.16, hgba1c 8.2  Patient denies shortness of breath, fever, cough and chest pain at PAT appointment   All instructions explained to the patient, with a verbal understanding of the material. Patient agrees to go over the instructions while at home for a better understanding. Patient also instructed to self quarantine after being tested for COVID-19. The opportunity to ask questions was provided.

## 2020-06-20 ENCOUNTER — Other Ambulatory Visit (HOSPITAL_COMMUNITY)
Admission: RE | Admit: 2020-06-20 | Discharge: 2020-06-20 | Disposition: A | Discharge status: Home / Self Care | Payer: Medicare Other | Source: Ambulatory Visit | Attending: Surgery | Admitting: Surgery

## 2020-06-20 DIAGNOSIS — Z20822 Contact with and (suspected) exposure to covid-19: Secondary | ICD-10-CM | POA: Insufficient documentation

## 2020-06-20 DIAGNOSIS — Z01812 Encounter for preprocedural laboratory examination: Secondary | ICD-10-CM | POA: Insufficient documentation

## 2020-06-20 LAB — SARS CORONAVIRUS 2 (TAT 6-24 HRS): SARS Coronavirus 2: NEGATIVE

## 2020-06-20 NOTE — Progress Notes (Signed)
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 She is here for next cycle of Nivolumab. The patient's last visit with [name] was on 05/10/2020. The pt reports that she is doing well overall.  The pt reports that she is getting her hernia surgery on Friday at [facility] surgery. She notes she got the clearance from Cardiology to do such. The pt notes her neuropathy is becoming increasingly worse due to her diabetes being sporadic and not as under control. The pt notes that she may have a UTI infection and has been off of her preventive antiobiotics.   Lab results today 06/21/2020 of CBC w/diff and CMP is as follows: all values are WNL except for Hgb of 11.5, Abs Immature Granulocytes of 0.11K. CMP stable.  On review of systems, pt reports worsening neuropathy and denies fevers, chills, infection issues, changes in bowel habits, abdominal pain, leg swelling, changes in breathing, and any other symptoms.  MEDICAL HISTORY:   Past Medical History:  Diagnosis Date  . Anemia   . Arthritis    lower back, hips, hands  . Biliary stricture   . Diabetes mellitus ([place])    type 2   . Early cataracts, bilateral    Md just watching  . Elevated liver enzymes   . GERD (gastroesophageal reflux disease)    occasional - diet controlled  . History of blood transfusion 2018  . History of hiatal hernia   . HTN (hypertension)   . Hyperlipidemia   . Hypothyroidism   . left renal ca dx'd 2018   renal cancer - left kidney removed, pill chemo x 1 yr  .  Myocardial infarction ([place]) 1991   no deficits  . SVD (spontaneous vaginal delivery)    x 3  . Wears glasses    Dyslipidemia Osteoarthritis Ex-smoker Coronary artery disease Thyroid disorder-was apparently on levothyroxine 25 g daily which has subsequently been discontinued . Mitral regurgitation B12 deficiency  hiatal hernia with esophagitis   SURGICAL HISTORY: Past Surgical History:  Procedure Laterality Date  . BALLOON DILATION N/A 07/23/2018   Procedure: BALLOON DILATION;  Surgeon: Rush Landmark Telford Nab., MD;  Location: [place];  Service: Gastroenterology;  Laterality: N/A;  . BILIARY BRUSHING  08/10/2018   Procedure: BILIARY BRUSHING;  Surgeon: Rush Landmark Telford Nab., MD;  Location: [place];  Service: Gastroenterology;;  . BILIARY BRUSHING  11/30/2018   Procedure: BILIARY BRUSHING;  Surgeon: Irving Copas., MD;  Location: [place];  Service: Gastroenterology;;  . BILIARY DILATION  08/10/2018   Procedure: BILIARY DILATION;  Surgeon: Irving Copas., MD;  Location: [place];  Service: Gastroenterology;;  . BILIARY DILATION  08/27/2018   Procedure: BILIARY DILATION;  Surgeon: Irving Copas., MD;  Location: [place];  Service: Gastroenterology;;  . BILIARY DILATION  01/24/2020   Procedure: BILIARY DILATION;  Surgeon: Irving Copas., MD;  Location: [place];  Service: Gastroenterology;;  . BILIARY STENT PLACEMENT  08/10/2018   Procedure: BILIARY STENT PLACEMENT;  Surgeon: Irving Copas., MD;  Location: [place];  Service: Gastroenterology;;  .  BILIARY STENT PLACEMENT  08/27/2018   Procedure: BILIARY STENT PLACEMENT;  Surgeon: Rush Landmark Telford Nab., MD;  Location: [place];  Service: Gastroenterology;;  . BILIARY STENT PLACEMENT  11/30/2018   Procedure: BILIARY STENT PLACEMENT;  Surgeon: Irving Copas., MD;  Location: [place];  Service: Gastroenterology;;  . BIOPSY  07/23/2018   Procedure: BIOPSY;   Surgeon: Irving Copas., MD;  Location: [place];  Service: Gastroenterology;;  . BIOPSY  01/24/2020   Procedure: BIOPSY;  Surgeon: Irving Copas., MD;  Location: [place];  Service: Gastroenterology;;  . CHOLECYSTECTOMY N/A 09/02/2018   Procedure: LAPAROSCOPIC CHOLECYSTECTOMY;  Surgeon: Stark Klein, MD;  Location: [place];  Service: General;  Laterality: N/A;  . COLONOSCOPY     normal   . ENDOSCOPIC RETROGRADE CHOLANGIOPANCREATOGRAPHY (ERCP) WITH PROPOFOL N/A 08/10/2018   Procedure: ENDOSCOPIC RETROGRADE CHOLANGIOPANCREATOGRAPHY (ERCP) WITH PROPOFOL;  Surgeon: Irving Copas., MD;  Location: [place];  Service: Gastroenterology;  Laterality: N/A;  . ENDOSCOPIC RETROGRADE CHOLANGIOPANCREATOGRAPHY (ERCP) WITH PROPOFOL N/A 11/30/2018   Procedure: ENDOSCOPIC RETROGRADE CHOLANGIOPANCREATOGRAPHY (ERCP) WITH PROPOFOL;  Surgeon: Rush Landmark Telford Nab., MD;  Location: [place];  Service: Gastroenterology;  Laterality: N/A;  . ENDOSCOPIC RETROGRADE CHOLANGIOPANCREATOGRAPHY (ERCP) WITH PROPOFOL N/A 01/24/2020   Procedure: ENDOSCOPIC RETROGRADE CHOLANGIOPANCREATOGRAPHY (ERCP) WITH PROPOFOL;  Surgeon: Rush Landmark Telford Nab., MD;  Location: [place];  Service: Gastroenterology;  Laterality: N/A;  . ERCP N/A 07/23/2018   Procedure: ENDOSCOPIC RETROGRADE CHOLANGIOPANCREATOGRAPHY (ERCP);  Surgeon: Irving Copas., MD;  Location: [place];  Service: Gastroenterology;  Laterality: N/A;  . ERCP N/A 08/27/2018   Procedure: ENDOSCOPIC RETROGRADE CHOLANGIOPANCREATOGRAPHY (ERCP);  Surgeon: Irving Copas., MD;  Location: [place];  Service: Gastroenterology;  Laterality: N/A;  . ESOPHAGOGASTRODUODENOSCOPY N/A 01/24/2020   Procedure: ESOPHAGOGASTRODUODENOSCOPY (EGD);  Surgeon: Irving Copas., MD;  Location: [place];  Service: Gastroenterology;  Laterality: N/A;  . ESOPHAGOGASTRODUODENOSCOPY (EGD) WITH PROPOFOL N/A 08/27/2018   Procedure:  ESOPHAGOGASTRODUODENOSCOPY (EGD) WITH PROPOFOL;  Surgeon: Rush Landmark Telford Nab., MD;  Location: [place];  Service: Gastroenterology;  Laterality: N/A;  . EUS N/A 08/27/2018   Procedure: ESOPHAGEAL ENDOSCOPIC ULTRASOUND (EUS) RADIAL;  Surgeon: Rush Landmark Telford Nab., MD;  Location: [place];  Service: Gastroenterology;  Laterality: N/A;  . FINE NEEDLE ASPIRATION  08/27/2018   Procedure: FINE NEEDLE ASPIRATION (FNA) LINEAR;  Surgeon: Irving Copas., MD;  Location: [place];  Service: Gastroenterology;;  . IR IMAGING GUIDED PORT INSERTION  01/08/2018  . LAPAROSCOPIC NEPHRECTOMY Left 08/01/2016   Procedure: LAPAROSCOPIC  RADICAL NEPHRECTOMY/ REPAIR OF UMBILICAL HERNIA;  Surgeon: Raynelle Bring, MD;  Location: [facility];  Service: Urology;  Laterality: Left;  . REMOVAL OF STONES  07/23/2018   Procedure: REMOVAL OF GALL STONES;  Surgeon: Rush Landmark Telford Nab., MD;  Location: [place];  Service: Gastroenterology;;  . REMOVAL OF STONES  08/10/2018   Procedure: REMOVAL OF STONES;  Surgeon: Irving Copas., MD;  Location: [place];  Service: Gastroenterology;;  . REMOVAL OF STONES  08/27/2018   Procedure: REMOVAL OF STONES;  Surgeon: Irving Copas., MD;  Location: [place];  Service: Gastroenterology;;  . REMOVAL OF STONES  01/24/2020   Procedure: REMOVAL OF STONES;  Surgeon: Irving Copas., MD;  Location: [place];  Service: Gastroenterology;;  . [name]  07/23/2018   Procedure: [name];  Surgeon: Irving Copas., MD;  Location: [place];  Service: Gastroenterology;;  . Lavell Islam REMOVAL  08/27/2018   Procedure: STENT REMOVAL;  Surgeon: Irving Copas., MD;  Location: [place];  Service: Gastroenterology;;  . Lavell Islam  REMOVAL  11/30/2018   Procedure: STENT REMOVAL;  Surgeon: Irving Copas., MD;  Location: [place];  Service: Gastroenterology;;  . Lavell Islam REMOVAL  01/24/2020   Procedure: STENT REMOVAL;  Surgeon:  Irving Copas., MD;  Location: [place];  Service: Gastroenterology;;  . UPPER GI ENDOSCOPY     x 1  . VAGINAL PROLAPSE REPAIR  11/18/2019   [facility]   No reported past surgeries EGD 05/01/2016 Dr. Earley Brooke Colonoscopy 03/2016 Dr. Earley Brooke  SOCIAL HISTORY: Social History   Socioeconomic History  . Marital status: Married    Spouse name: Not on file  . Number of children: 3  . Years of education: Not on file  . Highest education level: Not on file  Occupational History  . Occupation: retired  Tobacco Use  . Smoking status: Former Smoker    Packs/day: 1.00    Years: 8.00    Pack years: 8.00    Types: Cigarettes    Quit date: 06/18/1964    Years since quitting: 56.0  . Smokeless tobacco: Never Used  Vaping Use  . Vaping Use: Never used  Substance and Sexual Activity  . Alcohol use: No  . Drug use: No  . Sexual activity: Not on file  Other Topics Concern  . Not on file  Social History Narrative   Married.  Three children   Social Determinants of Health   Financial Resource Strain: Not on file  Food Insecurity: Not on file  Transportation Needs: Not on file  Physical Activity: Not on file  Stress: Not on file  Social Connections: Not on file  Intimate Partner Violence: Not on file   Patient lives in [place] Works as a Solicitor part-time Ex-smoker quit long time ago In 1961 . Smoked 2 packs per day for 8 years prior to that .  or a lot of stress since her daughter is also getting treated for stage IV uterine cancer.  FAMILY HISTORY:  Mother deceased Father with asthma and emphysema died at 41 years with the MI. Brother deceased of heart disease   ALLERGIES:  is allergic to adhesive [tape], nsaids, and statins.  MEDICATIONS:  Current Outpatient Medications  Medication Sig Dispense Refill  . amLODipine (NORVASC) 5 MG tablet Take 5 mg by mouth daily.   6  . atorvastatin (LIPITOR) 80 MG tablet Take 80 mg by mouth at bedtime.     .  carvedilol (COREG) 6.25 MG tablet Take 6.25 mg by mouth 2 (two) times daily.  6  . clobetasol ointment (TEMOVATE) 6.26 % Apply 1 application topically 2 (two) times daily as needed (lichen sclerosis).     . Denosumab (XGEVA [place]) Inject into the skin every 6 (six) weeks.    . diphenhydrAMINE (BENADRYL) 25 MG tablet Take 25 mg by mouth at bedtime.    Marland Kitchen glimepiride (AMARYL) 1 MG tablet Take 1 mg by mouth daily with breakfast.    . hydrocortisone (CORTEF) 10 MG tablet Take 5-10 mg by mouth See admin instructions. Take 1 tablet (10 mg) by mouth in the morning & 1/2 tablet (5 mg) by mouth in the evening.    Marland Kitchen levothyroxine (SYNTHROID) 75 MCG tablet Take 75 mcg by mouth daily before breakfast.     . Lidocaine 4 % PTCH Apply 1 patch topically daily as needed (pain).    Marland Kitchen lidocaine-prilocaine (EMLA) cream Apply 1 application topically daily as needed (prior to port access). 30 g 1  . Nivolumab (OPDIVO IV) Inject 1 Dose into  the vein every 14 (fourteen) days. [place]    . omeprazole (PRILOSEC) 20 MG capsule Take 1 capsule (20 mg total) by mouth 2 (two) times daily before a meal. 60 capsule 3  . sitaGLIPtin (JANUVIA) 50 MG tablet Take 50 mg by mouth daily.     No current facility-administered medications for this visit.   Facility-Administered Medications Ordered in Other Visits  Medication Dose Route Frequency Provider Last Rate Last Admin  . sodium chloride flush (NS) 0.9 % injection 10 mL  10 mL Intracatheter PRN Truitt Merle, MD   10 mL at 11/20/18 1232    REVIEW OF SYSTEMS:   10 Point review of Systems was done is negative except as noted above.  PHYSICAL EXAMINATION:  ECOG FS:2 - Symptomatic, <50% confined to bed  Vitals:   06/21/20 0853  BP: 139/76  Pulse: 75  Resp: 15  Temp: 97.6 F (36.4 C)  SpO2: 100%   Wt Readings from Last 3 Encounters:  06/21/20 132 lb 8 oz (60.1 kg)  06/07/20 131 lb (59.4 kg)  05/24/20 132 lb 4 oz (60 kg)   Body mass index is 26.76 kg/m.     GENERAL:alert, in no acute distress and comfortable SKIN: no acute rashes, no significant lesions EYES: conjunctiva are pink and non-injected, sclera anicteric OROPHARYNX: MMM, no exudates, no oropharyngeal erythema or ulceration NECK: supple, no JVD LYMPH:  no palpable lymphadenopathy in the cervical, axillary or inguinal regions LUNGS: clear to auscultation b/l with normal respiratory effort HEART: regular rate & rhythm ABDOMEN:  normoactive bowel sounds , non tender, not distended. Extremity: no pedal edema PSYCH: alert & oriented x 3 with fluent speech NEURO: no focal motor/sensory deficits  LABORATORY DATA:  I have reviewed the data as listed  . CBC Latest Ref Rng & Units 06/21/2020 06/07/2020 05/24/2020  WBC 4.0 - 10.5 K/uL 8.9 9.6 7.9  Hemoglobin 12.0 - 15.0 g/dL 11.5(L) 11.7(L) 11.1(L)  Hematocrit 36.0 - 46.0 % 36.4 36.9 35.4(L)  Platelets 150 - 400 K/uL 289 320 285    CBC    Component Value Date/Time   WBC 8.9 06/21/2020 0833   RBC 4.23 06/21/2020 0833   HGB 11.5 (L) 06/21/2020 0833   HGB 11.4 (L) 09/11/2018 0900   HGB 11.2 (L) 04/04/2017 0830   HCT 36.4 06/21/2020 0833   HCT 35.2 04/04/2017 0830   PLT 289 06/21/2020 0833   PLT 277 09/11/2018 0900   PLT 250 04/04/2017 0830   MCV 86.1 06/21/2020 0833   MCV 107.6 (H) 04/04/2017 0830   MCH 27.2 06/21/2020 0833   MCHC 31.6 06/21/2020 0833   RDW 14.6 06/21/2020 0833   RDW 14.0 04/04/2017 0830   LYMPHSABS 1.4 06/21/2020 0833   LYMPHSABS 1.6 04/04/2017 0830   MONOABS 0.8 06/21/2020 0833   MONOABS 0.4 04/04/2017 0830   EOSABS 0.1 06/21/2020 0833   EOSABS 0.1 04/04/2017 0830   BASOSABS 0.1 06/21/2020 0833   BASOSABS 0.0 04/04/2017 0830     . CMP Latest Ref Rng & Units 06/23/2020 06/21/2020 06/07/2020  Glucose 70 - 99 mg/dL - 144(H) 162(H)  BUN 8 - 23 mg/dL - 27(H) 38(H)  Creatinine 0.44 - 1.00 mg/dL 1.88(H) 2.13(H) 2.27(H)  Sodium 135 - 145 mmol/L - 137 133(L)  Potassium 3.5 - 5.1 mmol/L - 4.4 4.6  Chloride 98 -  111 mmol/L - 111 107  CO2 22 - 32 mmol/L - 16(L) 18(L)  Calcium 8.9 - 10.3 mg/dL - 9.5 9.3  Total Protein 6.5 -  8.1 g/dL - 6.8 7.0  Total Bilirubin 0.3 - 1.2 mg/dL - 0.4 0.4  Alkaline Phos 38 - 126 U/L - 62 65  AST 15 - 41 U/L - 24 19  ALT 0 - 44 U/L - 25 18   B12 level 299 (OSH)-->455  . Lab Results  Component Value Date   LDH 117 12/22/2019        08/27/18 Surgical Pathology from Cholecystectomy:         RADIOGRAPHIC STUDIES: I have personally reviewed the radiological images as listed and agreed with the findings in the report. Nm Pet Image Restag (ps) Skull Base To Thigh  Result Date: 01/03/2017 IMPRESSION: 1. There is a new lesion in the hepatic dome which is FDG avid and low in attenuation on CT imaging consistent with metastatic disease. Another region of uptake in the left hepatic lobe posteriorly demonstrates no CT correlate. A second subtle metastasis is not excluded on today's study. An MRI could better assess for other hepatic metastases if clinically warranted. 2. New metastatic lesion in the left side of T8. 3. New pulmonary nodule in the right upper lobe. This is too small to characterize but suspicious. Recommend attention on follow-up. No FDG avid nodules or other enlarging nodules. 4. The uptake at the previous left renal artery has almost resolved in the interval and is favored to be post therapeutic. Recommend attention on follow-up. 5. The metastasis in the posterior right hilum seen previously has resolved. Electronically Signed   By: Dorise Bullion III M.D   On: 01/03/2017 11:16   .No results found.  ASSESSMENT & PLAN:    80 y.o. 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 PET/CT 06/19/2017 -- consistent with partial metabolic response to treatment.  Rt adrenal gland bx - showed clear cell RCC  S/p CYtoreductive left  radical nephrectomy and left adrenal gland resection on 08/01/2016 by Dr Alinda Money.  10/16/17 PET/CT revealed Continued improvement, with the T7 metastatic lesion no longer significantly hypermetabolic. The previous right lower lobe pulmonary nodule is even less apparent, perhaps about 2 mm in diameter today; given that this measured 6 mm on 03/28/2017 this probably represents an effectively treated metastatic lesion. Currently no appreciable hypermetabolic activity is identified to suggest active malignancy. Distended gallbladder with gallbladder wall thickening and gallstones. Correlate clinically in assessing for cholecystitis. Small but abnormal amount of free pelvic fluid, nonspecific. Other imaging findings of potential clinical significance: Chronic ethmoid sinusitis. Aortic Atherosclerosis. Stable 5 mm right middle lobe pulmonary nodule, not hypermetabolic but below sensitive PET-CT size thresholds. Left nephrectomy. Notable pelvic floor laxity with cystocele. Chronic bilateral Sacroiliitis.  04/03/18 PET/CT revealed No evidence for new or progressive hypermetabolic disease on today's study to suggest metastatic progression. 2. Stable appearance of the T7 metastatic lesion without Hypermetabolism. 3. Tiny focus of FDG accumulation identified along the skin of the low right inguinal fold. No associated lesion evident on CT. This may be related to urinary contaminant. 4. Cholelithiasis with similar appearance of diffuse gallbladder wall thickening. 5. Stable 5 mm right middle lobe pulmonary nodule. 6.  Aortic Atherosclerois. 7. Diffuse colonic diverticulosis.   09/18/18 CT A/P revealed "Common duct stent in place. Improvement to resolution in biliary duct dilatation compared to 08/29/2018. Interval cholecystectomy without acute complication. 2. Left nephrectomy, without evidence of metastatic disease. 3.  Tiny hiatal hernia. 4. Coronary artery atherosclerosis. Aortic Atherosclerosis. 5. Mild  limitations  secondary to lack of IV contrast. 6. Marked pelvic floor laxity with cystocele and rectal prolapse".  07/02/2019 PET/CT ([id number]) which revealed "1. No evidence of hypermetabolic metastatic disease."  #2 b/l adrenal metastases from [place] s/p left adrenalectomy with adrenal insuff - follows with Dr Buddy Duty.  #3 Small pulmonary lesions -- 10/16/17 PET/CT showed pulmonary less apparent than 03/28/17 PET/CT, decreasing from 40mm to 78mm diameter.   MRI brain shows no evidence of metastatic disease  #4 T7/8 Bone metastases - received Xgeva every 4 weeks from May 2018 to June 2019. 10/16/17 PET/CT revealed improvement with T7 metastatic lesion no longer significantly hypermetabolic.  -on Marchelle Folks  #5 ?liver mets- rpt PET/CT from 10/16/2017 shows no overt evidence of metastatic disease in the liver.  #6 Grade 1 Nausea - improved and intermittent. Hasnt used her anti-emetic as instructed so some nausea and decreased po intake.  #7 Grade 1 Diarrhea - resolved  #8 Hyponatremia -  resolved with sodium at 136 - likely related to some element of adrenal insufficiency, diarrhea, limited by mouth intake. Primarily solute free fluid intake.   #9 Acute on chronic renal insufficiency Creatine stable 1.44  #10 Hyperkalemia due to ACEI + RF- resolved  #11 Hemorrhoids -chronic with some bleeding --Recommended Sitz bath and OTC Anusol or Nupercaine for her hemorrhoid relief. -f/u with PCP for continued mx   #12 Moderate protein calorie malnutrition Weight has stabilized and improved Wt Readings from Last 3 Encounters:  06/21/20 132 lb 8 oz (60.1 kg)  06/07/20 131 lb (59.4 kg)  05/24/20 132 lb 4 oz (60 kg)   Plan: Continue healthy po intake/diabetic diet -Previously recommended the patient to drink atleast 48-64 oz of fluids daily -f/u with PCP/Cardiology for diuretics management  #13 Hypothyroidism/Adrenal insufficiency/Diabetes -Continue being followed by Dr. Buddy Duty  #14 HTN - ?control. Patient  tends to be anxious and has higher blood pressures in the clinic. She can have increased blood pressure from [place] as well. Plan: -continued close f/u with her PCP /cardiology regarding the many elements necessary for her care that are not directly related to her oncology care. -ACEI held due to AKI and hyperkalemia - following with cardiology to mx this. Has been started on Amlodipine instead.  #15 Grade 1 mucositis- resolved -Advised the patient to continue use of Magic Mouthwash to aid with nutrition  #16 CAD - positive cardiac nuclear stress test Following with cardiology and nephrology   #17 h/o Choledocholithiasis and cholelithiasis 07/10/18 US Abdomen revealed Biliary duct dilatation with 8 mm calculus at the level of the ampulla in the distal common bile duct. 2. Cholelithiasis. No gallbladder wall thickening or pericholecystic fluid. 3. Appearance of the liver raises concern for underlying hepatic cirrhosis. No focal liver lesions are demonstrable. 4.  Left kidney absent. 5.  Small cysts in right kidney. 6.  Aortic Atherosclerosis.  S/p ERCP on 07/23/18 with Dr. Valarie Merino Mansouraty  09/02/18 Cytopathology from ERCP was suspicious for malignant cells in bile duct, but was not definitive, other two findings were benign.  PLAN:   -Discussed labwork today, 06/21/2020; blood counts all normal, chemistries pending. -Advised pt she is nearly 2.5 years out removed from treatment completion (September 2019). -Advised pt last imaging was October 2021. Will get repeat scans two months post surgery.  -Recommended pt f/u w Urologist regarding recurrent urinary symptoms. -Advised pt to f/u w OBGYN regarding local estrogen cream. -Advised pt to follow with Nephrologist regarding lower bicarbonate levels.  -Advised pt to avoid acid loads  on body. Avoid sodas, orange juice.  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 -No prohibitive  toxicities from Nivolumab. We will continue current treatment plan with Nivolumab with same premedications.  -Continue Xgeva every 6 weeks.  -Continue immunotherapies through hernia surgery.  -Will see back in 6 weeks with labs.  FOLLOW UP: -Plz schedule next 6 Nivolumab infusions q2weeks with portflush and labs -Continue Xgeva every 6 weeks please schedule next 4 treatments -MD visit in 6 weeks   The total time spent in the appointment was 30 minutes and more than 50% was on counseling and direct patient cares, ordering and management of immunotherapy  All of the patient's questions were answered with apparent satisfaction. The patient knows to call the clinic with any problems, questions or concerns.   Sullivan Lone MD [place] AAHIVMS [facility] Hematology/Oncology Physician [facility]  (Office):       [phone] (Work cell):  [phone] (Fax):           [phone]  I, Reinaldo Raddle, am acting as scribe for Dr. Sullivan Lone, MD.     .I have reviewed the above documentation for accuracy and completeness, and I agree with the above. Brunetta Genera MD

## 2020-06-21 ENCOUNTER — Encounter (HOSPITAL_COMMUNITY): Payer: Self-pay

## 2020-06-21 ENCOUNTER — Ambulatory Visit: Payer: Medicare Other | Admitting: Hematology

## 2020-06-21 ENCOUNTER — Other Ambulatory Visit: Payer: Medicare Other

## 2020-06-21 ENCOUNTER — Inpatient Hospital Stay (HOSPITAL_BASED_OUTPATIENT_CLINIC_OR_DEPARTMENT_OTHER): Payer: Medicare Other | Admitting: Hematology

## 2020-06-21 ENCOUNTER — Ambulatory Visit: Payer: Medicare Other

## 2020-06-21 ENCOUNTER — Encounter (HOSPITAL_COMMUNITY)
Admission: RE | Admit: 2020-06-21 | Discharge: 2020-06-21 | Disposition: A | Discharge status: Home / Self Care | Payer: Medicare Other | Source: Ambulatory Visit | Attending: Surgery | Admitting: Surgery

## 2020-06-21 ENCOUNTER — Other Ambulatory Visit (HOSPITAL_COMMUNITY): Payer: Medicare Other

## 2020-06-21 ENCOUNTER — Inpatient Hospital Stay: Payer: Medicare Other

## 2020-06-21 ENCOUNTER — Inpatient Hospital Stay: Payer: Medicare Other | Attending: Hematology

## 2020-06-21 ENCOUNTER — Other Ambulatory Visit: Payer: Self-pay

## 2020-06-21 VITALS — BP 139/76 | HR 75 | Temp 97.6°F | Resp 15 | Ht 59.0 in | Wt 132.5 lb

## 2020-06-21 DIAGNOSIS — Z7901 Long term (current) use of anticoagulants: Secondary | ICD-10-CM | POA: Insufficient documentation

## 2020-06-21 DIAGNOSIS — Z5112 Encounter for antineoplastic immunotherapy: Secondary | ICD-10-CM

## 2020-06-21 DIAGNOSIS — C7971 Secondary malignant neoplasm of right adrenal gland: Secondary | ICD-10-CM | POA: Insufficient documentation

## 2020-06-21 DIAGNOSIS — Z79899 Other long term (current) drug therapy: Secondary | ICD-10-CM | POA: Insufficient documentation

## 2020-06-21 DIAGNOSIS — Z7984 Long term (current) use of oral hypoglycemic drugs: Secondary | ICD-10-CM | POA: Insufficient documentation

## 2020-06-21 DIAGNOSIS — Z7189 Other specified counseling: Secondary | ICD-10-CM

## 2020-06-21 DIAGNOSIS — Z95828 Presence of other vascular implants and grafts: Secondary | ICD-10-CM

## 2020-06-21 DIAGNOSIS — C642 Malignant neoplasm of left kidney, except renal pelvis: Secondary | ICD-10-CM

## 2020-06-21 DIAGNOSIS — E039 Hypothyroidism, unspecified: Secondary | ICD-10-CM | POA: Insufficient documentation

## 2020-06-21 DIAGNOSIS — I10 Essential (primary) hypertension: Secondary | ICD-10-CM | POA: Insufficient documentation

## 2020-06-21 DIAGNOSIS — C7951 Secondary malignant neoplasm of bone: Secondary | ICD-10-CM

## 2020-06-21 DIAGNOSIS — G629 Polyneuropathy, unspecified: Secondary | ICD-10-CM | POA: Insufficient documentation

## 2020-06-21 DIAGNOSIS — K432 Incisional hernia without obstruction or gangrene: Secondary | ICD-10-CM | POA: Insufficient documentation

## 2020-06-21 DIAGNOSIS — I2510 Atherosclerotic heart disease of native coronary artery without angina pectoris: Secondary | ICD-10-CM | POA: Insufficient documentation

## 2020-06-21 DIAGNOSIS — K219 Gastro-esophageal reflux disease without esophagitis: Secondary | ICD-10-CM | POA: Insufficient documentation

## 2020-06-21 DIAGNOSIS — E538 Deficiency of other specified B group vitamins: Secondary | ICD-10-CM | POA: Insufficient documentation

## 2020-06-21 DIAGNOSIS — E440 Moderate protein-calorie malnutrition: Secondary | ICD-10-CM | POA: Insufficient documentation

## 2020-06-21 DIAGNOSIS — E119 Type 2 diabetes mellitus without complications: Secondary | ICD-10-CM | POA: Insufficient documentation

## 2020-06-21 DIAGNOSIS — I252 Old myocardial infarction: Secondary | ICD-10-CM | POA: Insufficient documentation

## 2020-06-21 DIAGNOSIS — Z01812 Encounter for preprocedural laboratory examination: Secondary | ICD-10-CM | POA: Insufficient documentation

## 2020-06-21 DIAGNOSIS — M1990 Unspecified osteoarthritis, unspecified site: Secondary | ICD-10-CM | POA: Insufficient documentation

## 2020-06-21 DIAGNOSIS — Z87891 Personal history of nicotine dependence: Secondary | ICD-10-CM | POA: Insufficient documentation

## 2020-06-21 DIAGNOSIS — C7800 Secondary malignant neoplasm of unspecified lung: Secondary | ICD-10-CM | POA: Insufficient documentation

## 2020-06-21 DIAGNOSIS — K649 Unspecified hemorrhoids: Secondary | ICD-10-CM | POA: Insufficient documentation

## 2020-06-21 DIAGNOSIS — R948 Abnormal results of function studies of other organs and systems: Secondary | ICD-10-CM | POA: Insufficient documentation

## 2020-06-21 DIAGNOSIS — E118 Type 2 diabetes mellitus with unspecified complications: Secondary | ICD-10-CM | POA: Insufficient documentation

## 2020-06-21 DIAGNOSIS — C7972 Secondary malignant neoplasm of left adrenal gland: Secondary | ICD-10-CM | POA: Insufficient documentation

## 2020-06-21 DIAGNOSIS — E785 Hyperlipidemia, unspecified: Secondary | ICD-10-CM | POA: Insufficient documentation

## 2020-06-21 DIAGNOSIS — R918 Other nonspecific abnormal finding of lung field: Secondary | ICD-10-CM | POA: Insufficient documentation

## 2020-06-21 DIAGNOSIS — K449 Diaphragmatic hernia without obstruction or gangrene: Secondary | ICD-10-CM | POA: Insufficient documentation

## 2020-06-21 HISTORY — DX: Calculus of gallbladder without cholecystitis without obstruction: K80.20

## 2020-06-21 HISTORY — DX: Family history of other specified conditions: Z84.89

## 2020-06-21 LAB — CMP (CANCER CENTER ONLY)
ALT: 25 U/L (ref 0–44)
AST: 24 U/L (ref 15–41)
Albumin: 3.7 g/dL (ref 3.5–5.0)
Alkaline Phosphatase: 62 U/L (ref 38–126)
Anion gap: 10 (ref 5–15)
BUN: 27 mg/dL — ABNORMAL HIGH (ref 8–23)
CO2: 16 mmol/L — ABNORMAL LOW (ref 22–32)
Calcium: 9.5 mg/dL (ref 8.9–10.3)
Chloride: 111 mmol/L (ref 98–111)
Creatinine: 2.13 mg/dL — ABNORMAL HIGH (ref 0.44–1.00)
GFR, Estimated: 23 mL/min — ABNORMAL LOW (ref >60)
Glucose, Bld: 144 mg/dL — ABNORMAL HIGH (ref 70–99)
Potassium: 4.4 mmol/L (ref 3.5–5.1)
Sodium: 137 mmol/L (ref 135–145)
Total Bilirubin: 0.4 mg/dL (ref 0.3–1.2)
Total Protein: 6.8 g/dL (ref 6.5–8.1)

## 2020-06-21 LAB — CBC WITH DIFFERENTIAL/PLATELET
Abs Immature Granulocytes: 0.11 10*3/uL — ABNORMAL HIGH (ref 0.00–0.07)
Basophils Absolute: 0.1 10*3/uL (ref 0.0–0.1)
Basophils Relative: 1 %
Eosinophils Absolute: 0.1 10*3/uL (ref 0.0–0.5)
Eosinophils Relative: 2 %
HCT: 36.4 % (ref 36.0–46.0)
Hemoglobin: 11.5 g/dL — ABNORMAL LOW (ref 12.0–15.0)
Immature Granulocytes: 1 %
Lymphocytes Relative: 15 %
Lymphs Abs: 1.4 10*3/uL (ref 0.7–4.0)
MCH: 27.2 pg (ref 26.0–34.0)
MCHC: 31.6 g/dL (ref 30.0–36.0)
MCV: 86.1 fL (ref 80.0–100.0)
Monocytes Absolute: 0.8 10*3/uL (ref 0.1–1.0)
Monocytes Relative: 9 %
Neutro Abs: 6.4 10*3/uL (ref 1.7–7.7)
Neutrophils Relative %: 72 %
Platelets: 289 10*3/uL (ref 150–400)
RBC: 4.23 MIL/uL (ref 3.87–5.11)
RDW: 14.6 % (ref 11.5–15.5)
WBC: 8.9 10*3/uL (ref 4.0–10.5)
nRBC: 0 % (ref 0.0–0.2)

## 2020-06-21 LAB — HEMOGLOBIN A1C
Hgb A1c MFr Bld: 8.2 % — ABNORMAL HIGH (ref 4.8–5.6)
Mean Plasma Glucose: 188.64 mg/dL

## 2020-06-21 MED ORDER — SODIUM CHLORIDE 0.9 % IV SOLN
INTRAVENOUS | Status: DC
  Filled 2020-06-21 (×2): qty 250

## 2020-06-21 MED ORDER — DENOSUMAB 120 MG/1.7ML ~~LOC~~ SOLN
SUBCUTANEOUS | Status: AC
  Filled 2020-06-21: qty 1.7

## 2020-06-21 MED ORDER — DENOSUMAB 120 MG/1.7ML ~~LOC~~ SOLN
120.0000 mg | Freq: Once | SUBCUTANEOUS | Status: AC
  Administered 2020-06-21: 120 mg via SUBCUTANEOUS

## 2020-06-21 MED ORDER — SODIUM CHLORIDE 0.9% FLUSH
10.0000 mL | Freq: Once | INTRAVENOUS | Status: AC
  Administered 2020-06-21: 10 mL
  Filled 2020-06-21: qty 10

## 2020-06-21 MED ORDER — NIVOLUMAB CHEMO INJECTION 100 MG/10ML
240.0000 mg | Freq: Once | INTRAVENOUS | Status: AC
  Administered 2020-06-21: 240 mg via INTRAVENOUS
  Filled 2020-06-21: qty 24

## 2020-06-21 NOTE — Patient Instructions (Signed)
[facility] Discharge Instructions for Patients Receiving Chemotherapy  Today you received the following chemotherapy agents: nivolumab  To help prevent nausea and vomiting after your treatment, we encourage you to take your nausea medication as directed.   If you develop nausea and vomiting that is not controlled by your nausea medication, call the clinic.   BELOW ARE SYMPTOMS THAT SHOULD BE REPORTED IMMEDIATELY:  *FEVER GREATER THAN 100.5 F  *CHILLS WITH OR WITHOUT FEVER  NAUSEA AND VOMITING THAT IS NOT CONTROLLED WITH YOUR NAUSEA MEDICATION  *UNUSUAL SHORTNESS OF BREATH  *UNUSUAL BRUISING OR BLEEDING  TENDERNESS IN MOUTH AND THROAT WITH OR WITHOUT PRESENCE OF ULCERS  *URINARY PROBLEMS  *BOWEL PROBLEMS  UNUSUAL RASH Items with * indicate a potential emergency and should be followed up as soon as possible.  Feel free to call the clinic should you have any questions or concerns. The clinic phone number is [phone].  Please show the CHEMO ALERT CARD at check-in to the Emergency Department and triage nurse. Denosumab injection What is this medicine? DENOSUMAB (den oh sue mab) slows bone breakdown. Prolia is used to treat osteoporosis in women after menopause and in men, and in people who are taking corticosteroids for 6 months or more. Xgeva is used to treat a high calcium level due to cancer and to prevent bone fractures and other bone problems caused by multiple myeloma or cancer bone metastases. Xgeva is also used to treat giant cell tumor of the bone. This medicine may be used for other purposes; ask your health care provider or pharmacist if you have questions. COMMON BRAND NAME(S): Prolia, XGEVA What should I tell my health care provider before I take this medicine? They need to know if you have any of these conditions:  dental disease  having surgery or tooth extraction  infection  kidney disease  low levels of calcium or Vitamin D in the  blood  malnutrition  on hemodialysis  skin conditions or sensitivity  thyroid or parathyroid disease  an unusual reaction to denosumab, other medicines, foods, dyes, or preservatives  pregnant or trying to get pregnant  breast-feeding How should I use this medicine? This medicine is for injection under the skin. It is given by a health care professional in a hospital or clinic setting. A special MedGuide will be given to you before each treatment. Be sure to read this information carefully each time. For Prolia, talk to your pediatrician regarding the use of this medicine in children. Special care may be needed. For Xgeva, talk to your pediatrician regarding the use of this medicine in children. While this drug may be prescribed for children as young as 13 years for selected conditions, precautions do apply. Overdosage: If you think you have taken too much of this medicine contact a poison control center or emergency room at once. NOTE: This medicine is only for you. Do not share this medicine with others. What if I miss a dose? It is important not to miss your dose. Call your doctor or health care professional if you are unable to keep an appointment. What may interact with this medicine? Do not take this medicine with any of the following medications:  other medicines containing denosumab This medicine may also interact with the following medications:  medicines that lower your chance of fighting infection  steroid medicines like prednisone or cortisone This list may not describe all possible interactions. Give your health care provider a list of all the medicines, herbs, non-prescription drugs,   or dietary supplements you use. Also tell them if you smoke, drink alcohol, or use illegal drugs. Some items may interact with your medicine. What should I watch for while using this medicine? Visit your doctor or health care professional for regular checks on your progress. Your doctor or  health care professional may order blood tests and other tests to see how you are doing. Call your doctor or health care professional for advice if you get a fever, chills or sore throat, or other symptoms of a cold or flu. Do not treat yourself. This drug may decrease your body's ability to fight infection. Try to avoid being around people who are sick. You should make sure you get enough calcium and vitamin D while you are taking this medicine, unless your doctor tells you not to. Discuss the foods you eat and the vitamins you take with your health care professional. See your dentist regularly. Brush and floss your teeth as directed. Before you have any dental work done, tell your dentist you are receiving this medicine. Do not become pregnant while taking this medicine or for 5 months after stopping it. Talk with your doctor or health care professional about your birth control options while taking this medicine. Women should inform their doctor if they wish to become pregnant or think they might be pregnant. There is a potential for serious side effects to an unborn child. Talk to your health care professional or pharmacist for more information. What side effects may I notice from receiving this medicine? Side effects that you should report to your doctor or health care professional as soon as possible:  allergic reactions like skin rash, itching or hives, swelling of the face, lips, or tongue  bone pain  breathing problems  dizziness  jaw pain, especially after dental work  redness, blistering, peeling of the skin  signs and symptoms of infection like fever or chills; cough; sore throat; pain or trouble passing urine  signs of low calcium like fast heartbeat, muscle cramps or muscle pain; pain, tingling, numbness in the hands or feet; seizures  unusual bleeding or bruising  unusually weak or tired Side effects that usually do not require medical attention (report to your doctor or  health care professional if they continue or are bothersome):  constipation  diarrhea  headache  joint pain  loss of appetite  muscle pain  runny nose  tiredness  upset stomach This list may not describe all possible side effects. Call your doctor for medical advice about side effects. You may report side effects to FDA at 1-800-FDA-1088. Where should I keep my medicine? This medicine is only given in a clinic, doctor's office, or other health care setting and will not be stored at home. NOTE: This sheet is a summary. It may not cover all possible information. If you have questions about this medicine, talk to your doctor, pharmacist, or health care provider.  2021 Elsevier/Gold Standard (2017-08-15 16:10:44)  

## 2020-06-21 NOTE — Progress Notes (Signed)
Ok to treat today with elevated Scr per [name]     Pt discharged in no apparent distress. Pt left ambulatory without assistance.  Pt aware of discharge instructions and verbalized understanding and had no further questions.  Marland Kitchen

## 2020-06-21 NOTE — Patient Instructions (Signed)

## 2020-06-22 NOTE — Progress Notes (Signed)
Anesthesia Chart Review   Case: [id number] Date/Time: 06/23/20 1045   Procedure: OPEN INCISIONAL HERNIA REPAIR WITH MESH (N/A ) - ROOM 2 STARTING AT 11:00AM FOR 90 MIN   Anesthesia type: General   Pre-op diagnosis: INCISIONAL HERNIA   Location: [facility] ROOM 02 / [facility]   Surgeons: Stechschulte, Nickola Major, MD      DISCUSSION:79 y.o. 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 Louanna Raw.   Pt last seen by cardiology 03/23/2020. Per OV note, " -Patient was previously cleared for gallbladder surgery in April 2020.  Myoview obtained in August 2019 in [place] showed inferior wall scar with a small area of ischemia, EF borderline.  Echo demonstrating normal EF.              -Her functional ability is currently limited by back issue and the sciatica, however despite this, she is doing majority of the household work at home.  She cleans, cooks, and decorate by herself.  Her husband does the vacuuming.  She has no recent anginal symptoms.  EKG continue to show minimal T wave inversion in the lateral leads that is unchanged.             -I discussed her case with DOD Dr. Sallyanne Kuster, given lack of anginal symptom and her ability to go through multiple gallbladder procedures without significant cardiac complication, she is cleared to proceed with her umbilical hernia repair as well."  Anticipate pt can proceed with planned procedure barring acute status change.   VS: LMP  (LMP Unknown)   PROVIDERS: [name], MD  Is PCP   Minus Breeding, MD is Cardiology  LABS: Labs reviewed: Acceptable for surgery. (all labs ordered are listed, but only abnormal results are displayed)  Labs Reviewed  HEMOGLOBIN A1C - Abnormal; Notable for the following components:      Result Value   Hgb A1c MFr Bld 8.2 (*)    All other  components within normal limits     IMAGES:   EKG: 03/23/2020 Rate 75 bpm Normal sinus rhythm  Nonspecific T wave abnormality  CV:  Past Medical History:  Diagnosis Date  . Anemia   . Arthritis    lower back, hips, hands  . Biliary stricture   . Diabetes mellitus ([place])    type 2   . Early cataracts, bilateral    Md just watching  . Elevated liver enzymes   . Family history of adverse reaction to anesthesia    Daughter hard to wake up  . Gallstones   . GERD (gastroesophageal reflux disease)    occasional - diet controlled  . History of blood transfusion 2018  . History of hiatal hernia   . HTN (hypertension)   . Hyperlipidemia   . Hypothyroidism   . left renal ca dx'd 2018   renal cancer - left kidney removed, pill chemo x 1 yr  . Myocardial infarction ([place]) 1991   no deficits  . SVD (spontaneous vaginal delivery)    x 3  . Wears glasses     Past Surgical History:  Procedure Laterality Date  . BALLOON DILATION N/A 07/23/2018   Procedure: BALLOON DILATION;  Surgeon: Rush Landmark Telford Nab., MD;  Location: [place];  Service: Gastroenterology;  Laterality: N/A;  . BILIARY BRUSHING  08/10/2018   Procedure: BILIARY BRUSHING;  Surgeon: Rush Landmark,  Telford Nab., MD;  Location: [facility] ENDOSCOPY;  Service: Gastroenterology;;  . BILIARY BRUSHING  11/30/2018   Procedure: BILIARY BRUSHING;  Surgeon: Irving Copas., MD;  Location: [place];  Service: Gastroenterology;;  . BILIARY DILATION  08/10/2018   Procedure: BILIARY DILATION;  Surgeon: Irving Copas., MD;  Location: [place];  Service: Gastroenterology;;  . BILIARY DILATION  08/27/2018   Procedure: BILIARY DILATION;  Surgeon: Irving Copas., MD;  Location: [place];  Service: Gastroenterology;;  . BILIARY DILATION  01/24/2020   Procedure: BILIARY DILATION;  Surgeon: Irving Copas., MD;  Location: [place];  Service: Gastroenterology;;  . BILIARY STENT PLACEMENT  08/10/2018    Procedure: BILIARY STENT PLACEMENT;  Surgeon: Irving Copas., MD;  Location: [place];  Service: Gastroenterology;;  . BILIARY STENT PLACEMENT  08/27/2018   Procedure: BILIARY STENT PLACEMENT;  Surgeon: Irving Copas., MD;  Location: [place];  Service: Gastroenterology;;  . BILIARY STENT PLACEMENT  11/30/2018   Procedure: BILIARY STENT PLACEMENT;  Surgeon: Irving Copas., MD;  Location: [place];  Service: Gastroenterology;;  . BIOPSY  07/23/2018   Procedure: BIOPSY;  Surgeon: Irving Copas., MD;  Location: [place];  Service: Gastroenterology;;  . BIOPSY  01/24/2020   Procedure: BIOPSY;  Surgeon: Irving Copas., MD;  Location: [place];  Service: Gastroenterology;;  . CHOLECYSTECTOMY N/A 09/02/2018   Procedure: LAPAROSCOPIC CHOLECYSTECTOMY;  Surgeon: Stark Klein, MD;  Location: [place];  Service: General;  Laterality: N/A;  . COLONOSCOPY     normal   . ENDOSCOPIC RETROGRADE CHOLANGIOPANCREATOGRAPHY (ERCP) WITH PROPOFOL N/A 08/10/2018   Procedure: ENDOSCOPIC RETROGRADE CHOLANGIOPANCREATOGRAPHY (ERCP) WITH PROPOFOL;  Surgeon: Irving Copas., MD;  Location: [place];  Service: Gastroenterology;  Laterality: N/A;  . ENDOSCOPIC RETROGRADE CHOLANGIOPANCREATOGRAPHY (ERCP) WITH PROPOFOL N/A 11/30/2018   Procedure: ENDOSCOPIC RETROGRADE CHOLANGIOPANCREATOGRAPHY (ERCP) WITH PROPOFOL;  Surgeon: Rush Landmark Telford Nab., MD;  Location: [place];  Service: Gastroenterology;  Laterality: N/A;  . ENDOSCOPIC RETROGRADE CHOLANGIOPANCREATOGRAPHY (ERCP) WITH PROPOFOL N/A 01/24/2020   Procedure: ENDOSCOPIC RETROGRADE CHOLANGIOPANCREATOGRAPHY (ERCP) WITH PROPOFOL;  Surgeon: Rush Landmark Telford Nab., MD;  Location: [place];  Service: Gastroenterology;  Laterality: N/A;  . ERCP N/A 07/23/2018   Procedure: ENDOSCOPIC RETROGRADE CHOLANGIOPANCREATOGRAPHY (ERCP);  Surgeon: Irving Copas., MD;  Location: [place];  Service:  Gastroenterology;  Laterality: N/A;  . ERCP N/A 08/27/2018   Procedure: ENDOSCOPIC RETROGRADE CHOLANGIOPANCREATOGRAPHY (ERCP);  Surgeon: Irving Copas., MD;  Location: [place];  Service: Gastroenterology;  Laterality: N/A;  . ESOPHAGOGASTRODUODENOSCOPY N/A 01/24/2020   Procedure: ESOPHAGOGASTRODUODENOSCOPY (EGD);  Surgeon: Irving Copas., MD;  Location: [place];  Service: Gastroenterology;  Laterality: N/A;  . ESOPHAGOGASTRODUODENOSCOPY (EGD) WITH PROPOFOL N/A 08/27/2018   Procedure: ESOPHAGOGASTRODUODENOSCOPY (EGD) WITH PROPOFOL;  Surgeon: Rush Landmark Telford Nab., MD;  Location: [place];  Service: Gastroenterology;  Laterality: N/A;  . EUS N/A 08/27/2018   Procedure: ESOPHAGEAL ENDOSCOPIC ULTRASOUND (EUS) RADIAL;  Surgeon: Rush Landmark Telford Nab., MD;  Location: [place];  Service: Gastroenterology;  Laterality: N/A;  . FINE NEEDLE ASPIRATION  08/27/2018   Procedure: FINE NEEDLE ASPIRATION (FNA) LINEAR;  Surgeon: Irving Copas., MD;  Location: [place];  Service: Gastroenterology;;  . IR IMAGING GUIDED PORT INSERTION  01/08/2018  . LAPAROSCOPIC NEPHRECTOMY Left 08/01/2016   Procedure: LAPAROSCOPIC  RADICAL NEPHRECTOMY/ REPAIR OF UMBILICAL HERNIA;  Surgeon: Raynelle Bring, MD;  [facility];  Service: Urology;  Laterality: Left;  . REMOVAL OF STONES  07/23/2018   Procedure: REMOVAL OF GALL STONES;  Surgeon: Rush Landmark Telford Nab., MD;  Location: [facility];  Service: Gastroenterology;;  . REMOVAL OF STONES  08/10/2018   Procedure: REMOVAL OF STONES;  Surgeon: Rush Landmark Telford Nab., MD;  Location: [place];  Service: Gastroenterology;;  . REMOVAL OF STONES  08/27/2018   Procedure: REMOVAL OF STONES;  Surgeon: Irving Copas., MD;  Location: [place];  Service: Gastroenterology;;  . REMOVAL OF STONES  01/24/2020   Procedure: REMOVAL OF STONES;  Surgeon: Irving Copas., MD;  Location: [place];  Service: Gastroenterology;;  .  [name]  07/23/2018   Procedure: [name];  Surgeon: Irving Copas., MD;  Location: [place];  Service: Gastroenterology;;  . Lavell Islam REMOVAL  08/27/2018   Procedure: STENT REMOVAL;  Surgeon: Irving Copas., MD;  Location: [place];  Service: Gastroenterology;;  . Lavell Islam REMOVAL  11/30/2018   Procedure: STENT REMOVAL;  Surgeon: Irving Copas., MD;  Location: [place];  Service: Gastroenterology;;  . Lavell Islam REMOVAL  01/24/2020   Procedure: STENT REMOVAL;  Surgeon: Irving Copas., MD;  Location: [place];  Service: Gastroenterology;;  . UPPER GI ENDOSCOPY     x 1  . VAGINAL PROLAPSE REPAIR  11/18/2019   [facility]    MEDICATIONS: . amLODipine (NORVASC) 5 MG tablet  . atorvastatin (LIPITOR) 80 MG tablet  . carvedilol (COREG) 6.25 MG tablet  . clobetasol ointment (TEMOVATE) 0.05 %  . Denosumab (XGEVA [place])  . diphenhydrAMINE (BENADRYL) 25 MG tablet  . glimepiride (AMARYL) 1 MG tablet  . hydrocortisone (CORTEF) 10 MG tablet  . levothyroxine (SYNTHROID) 75 MCG tablet  . Lidocaine 4 % PTCH  . lidocaine-prilocaine (EMLA) cream  . Nivolumab (OPDIVO IV)  . omeprazole (PRILOSEC) 20 MG capsule  . sitaGLIPtin (JANUVIA) 50 MG tablet   No current facility-administered medications for this encounter.   . sodium chloride flush (NS) 0.9 % injection 10 mL     Konrad Felix, PA-C [facility] [phone]

## 2020-06-22 NOTE — Anesthesia Preprocedure Evaluation (Addendum)
Anesthesia Evaluation  Patient identified by MRN, date of birth, ID band Patient awake    Reviewed: Allergy & Precautions, NPO status , Patient's Chart, lab work & pertinent test results  History of Anesthesia Complications Negative for: history of anesthetic complications  Airway Mallampati: II  TM Distance: >3 FB Neck ROM: Full    Dental  (+) Dental Advisory Given   Pulmonary former smoker,    Pulmonary exam normal        Cardiovascular hypertension, Pt. on medications + CAD and + Past MI  Normal cardiovascular exam     Neuro/Psych negative neurological ROS  negative psych ROS   GI/Hepatic Neg liver ROS, hiatal hernia, GERD  Medicated and Controlled,  Endo/Other  diabetes, Type 2, Oral Hypoglycemic AgentsHypothyroidism   Renal/GU CRFRenal disease S/p left nephrectomy for renal cancer      Musculoskeletal  (+) Arthritis ,   Abdominal   Peds  Hematology negative hematology ROS (+)   Anesthesia Other Findings Covid test negative See PAT note  Reproductive/Obstetrics                           Anesthesia Physical Anesthesia Plan  ASA: III  Anesthesia Plan: General   Post-op Pain Management:    Induction: Intravenous  PONV Risk Score and Plan: 3 and Treatment may vary due to age or medical condition, Ondansetron and Propofol infusion  Airway Management Planned: Oral ETT  Additional Equipment: None  Intra-op Plan:   Post-operative Plan: Extubation in OR  Informed Consent: I have reviewed the patients History and Physical, chart, labs and discussed the procedure including the risks, benefits and alternatives for the proposed anesthesia with the patient or authorized representative who has indicated his/her understanding and acceptance.     Dental advisory given  Plan Discussed with: CRNA and Anesthesiologist  Anesthesia Plan Comments:       Anesthesia Quick  Evaluation

## 2020-06-23 ENCOUNTER — Ambulatory Visit (HOSPITAL_COMMUNITY): Payer: Medicare Other | Admitting: Anesthesiology

## 2020-06-23 ENCOUNTER — Encounter (HOSPITAL_COMMUNITY)
Admission: RE | Disposition: A | Discharge status: Home / Self Care | Payer: Self-pay | Source: Home / Self Care | Attending: Surgery

## 2020-06-23 ENCOUNTER — Ambulatory Visit (HOSPITAL_COMMUNITY): Payer: Medicare Other | Admitting: Physician Assistant

## 2020-06-23 ENCOUNTER — Other Ambulatory Visit: Payer: Self-pay

## 2020-06-23 ENCOUNTER — Inpatient Hospital Stay (HOSPITAL_COMMUNITY)
Admission: RE | Admit: 2020-06-23 | Discharge: 2020-06-25 | DRG: 335 | Disposition: A | Discharge status: Home / Self Care | Payer: Medicare Other | Attending: Surgery | Admitting: Surgery

## 2020-06-23 ENCOUNTER — Encounter (HOSPITAL_COMMUNITY): Payer: Self-pay | Admitting: Surgery

## 2020-06-23 DIAGNOSIS — M159 Polyosteoarthritis, unspecified: Secondary | ICD-10-CM | POA: Diagnosis present

## 2020-06-23 DIAGNOSIS — K2971 Gastritis, unspecified, with bleeding: Secondary | ICD-10-CM | POA: Diagnosis present

## 2020-06-23 DIAGNOSIS — I252 Old myocardial infarction: Secondary | ICD-10-CM

## 2020-06-23 DIAGNOSIS — D649 Anemia, unspecified: Secondary | ICD-10-CM | POA: Diagnosis present

## 2020-06-23 DIAGNOSIS — Z8249 Family history of ischemic heart disease and other diseases of the circulatory system: Secondary | ICD-10-CM

## 2020-06-23 DIAGNOSIS — C7951 Secondary malignant neoplasm of bone: Secondary | ICD-10-CM | POA: Diagnosis present

## 2020-06-23 DIAGNOSIS — K432 Incisional hernia without obstruction or gangrene: Principal | ICD-10-CM | POA: Diagnosis present

## 2020-06-23 DIAGNOSIS — I10 Essential (primary) hypertension: Secondary | ICD-10-CM | POA: Diagnosis present

## 2020-06-23 DIAGNOSIS — Z91048 Other nonmedicinal substance allergy status: Secondary | ICD-10-CM

## 2020-06-23 DIAGNOSIS — Z905 Acquired absence of kidney: Secondary | ICD-10-CM

## 2020-06-23 DIAGNOSIS — K219 Gastro-esophageal reflux disease without esophagitis: Secondary | ICD-10-CM | POA: Diagnosis present

## 2020-06-23 DIAGNOSIS — Z886 Allergy status to analgesic agent status: Secondary | ICD-10-CM

## 2020-06-23 DIAGNOSIS — Z79890 Hormone replacement therapy (postmenopausal): Secondary | ICD-10-CM

## 2020-06-23 DIAGNOSIS — K449 Diaphragmatic hernia without obstruction or gangrene: Secondary | ICD-10-CM | POA: Diagnosis present

## 2020-06-23 DIAGNOSIS — Z87891 Personal history of nicotine dependence: Secondary | ICD-10-CM

## 2020-06-23 DIAGNOSIS — Z79899 Other long term (current) drug therapy: Secondary | ICD-10-CM

## 2020-06-23 DIAGNOSIS — K660 Peritoneal adhesions (postprocedural) (postinfection): Secondary | ICD-10-CM | POA: Diagnosis present

## 2020-06-23 DIAGNOSIS — I2510 Atherosclerotic heart disease of native coronary artery without angina pectoris: Secondary | ICD-10-CM | POA: Diagnosis present

## 2020-06-23 DIAGNOSIS — Z85528 Personal history of other malignant neoplasm of kidney: Secondary | ICD-10-CM

## 2020-06-23 DIAGNOSIS — Z888 Allergy status to other drugs, medicaments and biological substances status: Secondary | ICD-10-CM

## 2020-06-23 DIAGNOSIS — E785 Hyperlipidemia, unspecified: Secondary | ICD-10-CM | POA: Diagnosis present

## 2020-06-23 DIAGNOSIS — Z20822 Contact with and (suspected) exposure to covid-19: Secondary | ICD-10-CM | POA: Diagnosis present

## 2020-06-23 DIAGNOSIS — E039 Hypothyroidism, unspecified: Secondary | ICD-10-CM | POA: Diagnosis present

## 2020-06-23 HISTORY — PX: INCISIONAL HERNIA REPAIR: SHX193

## 2020-06-23 LAB — CBC
HCT: 36.5 % (ref 36.0–46.0)
Hemoglobin: 11 g/dL — ABNORMAL LOW (ref 12.0–15.0)
MCH: 26.7 pg (ref 26.0–34.0)
MCHC: 30.1 g/dL (ref 30.0–36.0)
MCV: 88.6 fL (ref 80.0–100.0)
Platelets: 278 10*3/uL (ref 150–400)
RBC: 4.12 MIL/uL (ref 3.87–5.11)
RDW: 14.6 % (ref 11.5–15.5)
WBC: 11.4 10*3/uL — ABNORMAL HIGH (ref 4.0–10.5)
nRBC: 0 % (ref 0.0–0.2)

## 2020-06-23 LAB — GLUCOSE, CAPILLARY
Glucose-Capillary: 134 mg/dL — ABNORMAL HIGH (ref 70–99)
Glucose-Capillary: 209 mg/dL — ABNORMAL HIGH (ref 70–99)
Glucose-Capillary: 133 mg/dL — ABNORMAL HIGH (ref 70–99)
Glucose-Capillary: 199 mg/dL — ABNORMAL HIGH (ref 70–99)

## 2020-06-23 LAB — CREATININE, SERUM
Creatinine, Ser: 1.88 mg/dL — ABNORMAL HIGH (ref 0.44–1.00)
GFR, Estimated: 27 mL/min — ABNORMAL LOW (ref >60)

## 2020-06-23 SURGERY — REPAIR, HERNIA, INCISIONAL
Anesthesia: General

## 2020-06-23 MED ORDER — ONDANSETRON HCL 4 MG/2ML IJ SOLN: 4.0000 mg | Freq: Four times a day (QID) | INTRAMUSCULAR | Status: DC | PRN

## 2020-06-23 MED ORDER — BUPIVACAINE-EPINEPHRINE 0.25% -1:200000 IJ SOLN
INTRAMUSCULAR | Status: DC | PRN
  Administered 2020-06-23: 30 mL

## 2020-06-23 MED ORDER — HYDROCORTISONE 5 MG PO TABS
10.0000 mg | ORAL_TABLET | Freq: Every day | ORAL | Status: DC
  Administered 2020-06-24 – 2020-06-25 (×2): 10 mg via ORAL
  Filled 2020-06-23: qty 2
  Filled 2020-06-23: qty 1

## 2020-06-23 MED ORDER — ROCURONIUM BROMIDE 10 MG/ML (PF) SYRINGE
PREFILLED_SYRINGE | INTRAVENOUS | Status: DC | PRN
  Administered 2020-06-23: 40 mg via INTRAVENOUS

## 2020-06-23 MED ORDER — AMISULPRIDE (ANTIEMETIC) 5 MG/2ML IV SOLN
10.0000 mg | Freq: Once | INTRAVENOUS | Status: AC
  Administered 2020-06-23: 10 mg via INTRAVENOUS

## 2020-06-23 MED ORDER — ONDANSETRON HCL 4 MG/2ML IJ SOLN
INTRAMUSCULAR | Status: DC | PRN
  Administered 2020-06-23: 4 mg via INTRAVENOUS

## 2020-06-23 MED ORDER — OXYCODONE HCL 5 MG PO TABS
5.0000 mg | ORAL_TABLET | Freq: Once | ORAL | Status: AC | PRN
Start: 2020-06-23 — End: 2020-06-23
  Administered 2020-06-23: 5 mg via ORAL

## 2020-06-23 MED ORDER — CEFAZOLIN SODIUM-DEXTROSE 2-4 GM/100ML-% IV SOLN
2.0000 g | INTRAVENOUS | Status: AC
  Administered 2020-06-23: 2 g via INTRAVENOUS
  Filled 2020-06-23: qty 100

## 2020-06-23 MED ORDER — ACETAMINOPHEN 325 MG PO TABS
650.0000 mg | ORAL_TABLET | Freq: Four times a day (QID) | ORAL | Status: DC
  Administered 2020-06-23 – 2020-06-25 (×7): 650 mg via ORAL
  Filled 2020-06-23 (×7): qty 2

## 2020-06-23 MED ORDER — LEVOTHYROXINE SODIUM 75 MCG PO TABS
75.0000 ug | ORAL_TABLET | Freq: Every day | ORAL | Status: DC
  Administered 2020-06-24 – 2020-06-25 (×2): 75 ug via ORAL
  Filled 2020-06-23 (×2): qty 1

## 2020-06-23 MED ORDER — HYDRALAZINE HCL 20 MG/ML IJ SOLN
INTRAMUSCULAR | Status: DC | PRN
  Administered 2020-06-23: 10 mg via INTRAVENOUS

## 2020-06-23 MED ORDER — SUGAMMADEX SODIUM 200 MG/2ML IV SOLN
INTRAVENOUS | Status: DC | PRN
  Administered 2020-06-23: 150 mg via INTRAVENOUS

## 2020-06-23 MED ORDER — CHLORHEXIDINE GLUCONATE CLOTH 2 % EX PADS: 6.0000 | MEDICATED_PAD | Freq: Once | CUTANEOUS | Status: DC

## 2020-06-23 MED ORDER — ONDANSETRON HCL 4 MG/2ML IJ SOLN
4.0000 mg | Freq: Once | INTRAMUSCULAR | Status: AC | PRN
  Administered 2020-06-23: 4 mg via INTRAVENOUS

## 2020-06-23 MED ORDER — HYDROCORTISONE 5 MG PO TABS
5.0000 mg | ORAL_TABLET | Freq: Every day | ORAL | Status: DC
  Administered 2020-06-23 – 2020-06-24 (×2): 5 mg via ORAL
  Filled 2020-06-23 (×3): qty 1

## 2020-06-23 MED ORDER — SODIUM CHLORIDE 0.9 % IR SOLN
Status: DC | PRN
  Administered 2020-06-23: 1000 mL

## 2020-06-23 MED ORDER — INSULIN ASPART 100 UNIT/ML ~~LOC~~ SOLN: 0.0000 [IU] | Freq: Every day | SUBCUTANEOUS | Status: DC

## 2020-06-23 MED ORDER — HYDROCORTISONE 5 MG PO TABS: 5.0000 mg | ORAL_TABLET | ORAL | Status: DC

## 2020-06-23 MED ORDER — LACTATED RINGERS IV SOLN: INTRAVENOUS | Status: DC

## 2020-06-23 MED ORDER — PROPOFOL 10 MG/ML IV BOLUS
INTRAVENOUS | Status: AC
  Filled 2020-06-23: qty 20

## 2020-06-23 MED ORDER — ACETAMINOPHEN 500 MG PO TABS
1000.0000 mg | ORAL_TABLET | ORAL | Status: AC
  Administered 2020-06-23: 1000 mg via ORAL
  Filled 2020-06-23: qty 2

## 2020-06-23 MED ORDER — DIPHENHYDRAMINE HCL 25 MG PO CAPS
25.0000 mg | ORAL_CAPSULE | Freq: Every day | ORAL | Status: DC
  Administered 2020-06-23 – 2020-06-24 (×2): 25 mg via ORAL
  Filled 2020-06-23 (×3): qty 1

## 2020-06-23 MED ORDER — INSULIN ASPART 100 UNIT/ML ~~LOC~~ SOLN
0.0000 [IU] | Freq: Three times a day (TID) | SUBCUTANEOUS | Status: DC
  Administered 2020-06-23: 2 [IU] via SUBCUTANEOUS
  Administered 2020-06-23 – 2020-06-24 (×2): 5 [IU] via SUBCUTANEOUS
  Administered 2020-06-25: 8 [IU] via SUBCUTANEOUS

## 2020-06-23 MED ORDER — AMLODIPINE BESYLATE 5 MG PO TABS
5.0000 mg | ORAL_TABLET | Freq: Every day | ORAL | Status: DC
  Administered 2020-06-24 – 2020-06-25 (×2): 5 mg via ORAL
  Filled 2020-06-23 (×3): qty 1

## 2020-06-23 MED ORDER — FENTANYL CITRATE (PF) 100 MCG/2ML IJ SOLN
INTRAMUSCULAR | Status: AC
  Filled 2020-06-23: qty 4

## 2020-06-23 MED ORDER — BUPIVACAINE-EPINEPHRINE (PF) 0.25% -1:200000 IJ SOLN
INTRAMUSCULAR | Status: AC
  Filled 2020-06-23: qty 30

## 2020-06-23 MED ORDER — AMISULPRIDE (ANTIEMETIC) 5 MG/2ML IV SOLN
INTRAVENOUS | Status: AC
  Filled 2020-06-23: qty 4

## 2020-06-23 MED ORDER — ATORVASTATIN CALCIUM 40 MG PO TABS
80.0000 mg | ORAL_TABLET | Freq: Every day | ORAL | Status: DC
  Administered 2020-06-23 – 2020-06-24 (×2): 80 mg via ORAL
  Filled 2020-06-23 (×2): qty 2

## 2020-06-23 MED ORDER — INSULIN ASPART 100 UNIT/ML ~~LOC~~ SOLN
SUBCUTANEOUS | Status: AC
  Filled 2020-06-23: qty 1

## 2020-06-23 MED ORDER — FENTANYL CITRATE (PF) 100 MCG/2ML IJ SOLN
INTRAMUSCULAR | Status: DC | PRN
  Administered 2020-06-23 (×3): 50 ug via INTRAVENOUS
  Administered 2020-06-23: 100 ug via INTRAVENOUS

## 2020-06-23 MED ORDER — FENTANYL CITRATE (PF) 100 MCG/2ML IJ SOLN
25.0000 ug | INTRAMUSCULAR | Status: DC | PRN
  Administered 2020-06-23 (×3): 50 ug via INTRAVENOUS

## 2020-06-23 MED ORDER — OXYCODONE HCL 5 MG PO TABS
5.0000 mg | ORAL_TABLET | ORAL | Status: DC | PRN
  Administered 2020-06-23 – 2020-06-24 (×2): 5 mg via ORAL
  Filled 2020-06-23 (×3): qty 1

## 2020-06-23 MED ORDER — ONDANSETRON 4 MG PO TBDP: 4.0000 mg | ORAL_TABLET | Freq: Four times a day (QID) | ORAL | Status: DC | PRN

## 2020-06-23 MED ORDER — ONDANSETRON HCL 4 MG/2ML IJ SOLN
INTRAMUSCULAR | Status: AC
  Filled 2020-06-23: qty 2

## 2020-06-23 MED ORDER — HEPARIN SODIUM (PORCINE) 5000 UNIT/ML IJ SOLN
5000.0000 [IU] | Freq: Three times a day (TID) | INTRAMUSCULAR | Status: DC
  Administered 2020-06-23 – 2020-06-24 (×4): 5000 [IU] via SUBCUTANEOUS
  Filled 2020-06-23 (×5): qty 1

## 2020-06-23 MED ORDER — FENTANYL CITRATE (PF) 250 MCG/5ML IJ SOLN
INTRAMUSCULAR | Status: AC
  Filled 2020-06-23: qty 5

## 2020-06-23 MED ORDER — PROPOFOL 10 MG/ML IV BOLUS
INTRAVENOUS | Status: DC | PRN
  Administered 2020-06-23: 30 mg via INTRAVENOUS
  Administered 2020-06-23: 90 mg via INTRAVENOUS

## 2020-06-23 MED ORDER — ENOXAPARIN SODIUM 40 MG/0.4ML ~~LOC~~ SOLN
40.0000 mg | Freq: Once | SUBCUTANEOUS | Status: AC
  Administered 2020-06-23: 40 mg via SUBCUTANEOUS
  Filled 2020-06-23: qty 0.4

## 2020-06-23 MED ORDER — OXYCODONE HCL 5 MG/5ML PO SOLN: 5.0000 mg | Freq: Once | ORAL | Status: AC | PRN

## 2020-06-23 MED ORDER — OXYCODONE HCL 5 MG PO TABS
ORAL_TABLET | ORAL | Status: AC
  Filled 2020-06-23: qty 1

## 2020-06-23 MED ORDER — PANTOPRAZOLE SODIUM 40 MG PO TBEC
40.0000 mg | DELAYED_RELEASE_TABLET | Freq: Every day | ORAL | Status: DC
  Administered 2020-06-24 – 2020-06-25 (×2): 40 mg via ORAL
  Filled 2020-06-23 (×2): qty 1

## 2020-06-23 MED ORDER — GABAPENTIN 300 MG PO CAPS
300.0000 mg | ORAL_CAPSULE | ORAL | Status: AC
  Administered 2020-06-23: 300 mg via ORAL
  Filled 2020-06-23: qty 1

## 2020-06-23 MED ORDER — CARVEDILOL 6.25 MG PO TABS
6.2500 mg | ORAL_TABLET | Freq: Two times a day (BID) | ORAL | Status: DC
  Administered 2020-06-23 – 2020-06-25 (×4): 6.25 mg via ORAL
  Filled 2020-06-23 (×4): qty 1

## 2020-06-23 MED ORDER — HYDRALAZINE HCL 20 MG/ML IJ SOLN
INTRAMUSCULAR | Status: AC
  Filled 2020-06-23: qty 1

## 2020-06-23 SURGICAL SUPPLY — 43 items
BENZOIN TINCTURE PRP APPL 2/3 (GAUZE/BANDAGES/DRESSINGS) IMPLANT
BLADE HEX COATED 2.75 (ELECTRODE) ×2 IMPLANT
CATH MALECOT BARD  24FR (CATHETERS)
CATH MALECOT BARD 24FR (CATHETERS) IMPLANT
COVER WAND RF STERILE (DRAPES) IMPLANT
DECANTER SPIKE VIAL GLASS SM (MISCELLANEOUS) ×2 IMPLANT
DERMABOND ADVANCED (GAUZE/BANDAGES/DRESSINGS) ×1
DERMABOND ADVANCED .7 DNX12 (GAUZE/BANDAGES/DRESSINGS) ×1 IMPLANT
DRAIN CHANNEL 19F RND (DRAIN) ×2 IMPLANT
DRAPE LAPAROTOMY T 102X78X121 (DRAPES) ×2 IMPLANT
DRSG TEGADERM 4X4.75 (GAUZE/BANDAGES/DRESSINGS) IMPLANT
ELECT REM PT RETURN 15FT ADLT (MISCELLANEOUS) ×2 IMPLANT
EVACUATOR SILICONE 100CC (DRAIN) ×2 IMPLANT
GAUZE SPONGE 4X4 12PLY STRL (GAUZE/BANDAGES/DRESSINGS) ×4 IMPLANT
GLOVE INDICATOR 8.0 STRL GRN (GLOVE) ×4 IMPLANT
GLOVE SS BIOGEL STRL SZ 7.5 (GLOVE) ×1 IMPLANT
GLOVE SUPERSENSE BIOGEL SZ 7.5 (GLOVE) ×1
GLOVE SURG UNDER POLY LF SZ7 (GLOVE) ×2 IMPLANT
GOWN STRL REUS W/TWL LRG LVL3 (GOWN DISPOSABLE) ×2 IMPLANT
GOWN STRL REUS W/TWL XL LVL3 (GOWN DISPOSABLE) ×4 IMPLANT
HANDLE SUCTION POOLE (INSTRUMENTS) IMPLANT
KIT BASIN OR (CUSTOM PROCEDURE TRAY) ×2 IMPLANT
KIT TURNOVER KIT A (KITS) ×2 IMPLANT
MARKER SKIN DUAL TIP RULER LAB (MISCELLANEOUS) ×2 IMPLANT
MESH BARD SOFT 6X6IN (Mesh General) ×2 IMPLANT
NEEDLE HYPO 22GX1.5 SAFETY (NEEDLE) ×2 IMPLANT
NS IRRIG 1000ML POUR BTL (IV SOLUTION) ×2 IMPLANT
PACK GENERAL/GYN (CUSTOM PROCEDURE TRAY) ×2 IMPLANT
PENCIL SMOKE EVACUATOR (MISCELLANEOUS) IMPLANT
SPONGE LAP 18X18 RF (DISPOSABLE) IMPLANT
STRIP CLOSURE SKIN 1/2X4 (GAUZE/BANDAGES/DRESSINGS) IMPLANT
SUCTION POOLE HANDLE (INSTRUMENTS)
SUT ETHILON 2 0 PS N (SUTURE) ×2 IMPLANT
SUT MNCRL AB 4-0 PS2 18 (SUTURE) ×2 IMPLANT
SUT NOVA 0 T19/GS 22DT (SUTURE) IMPLANT
SUT NOVA 1 T20/GS 25DT (SUTURE) IMPLANT
SUT PDS AB 2-0 CT2 27 (SUTURE) ×10 IMPLANT
SUT PROLENE 0 CT 1 30 (SUTURE) IMPLANT
SUT PROLENE 0 CT 1 CR/8 (SUTURE) IMPLANT
SUT VIC AB 3-0 SH 27 (SUTURE) ×3
SUT VIC AB 3-0 SH 27X BRD (SUTURE) ×3 IMPLANT
SYR CONTROL 10ML LL (SYRINGE) ×2 IMPLANT
TOWEL OR 17X26 10 PK STRL BLUE (TOWEL DISPOSABLE) ×2 IMPLANT

## 2020-06-23 NOTE — Anesthesia Postprocedure Evaluation (Signed)
Anesthesia Post Note  Patient: Audrey [name]  Procedure(s) Performed: OPEN INCISIONAL HERNIA REPAIR WITH MESH (N/A )     Patient location during evaluation: PACU Anesthesia Type: General Level of consciousness: awake and alert Pain management: pain level controlled Vital Signs Assessment: post-procedure vital signs reviewed and stable Respiratory status: spontaneous breathing, nonlabored ventilation, respiratory function stable and patient connected to nasal cannula oxygen Cardiovascular status: blood pressure returned to baseline and stable Postop Assessment: no apparent nausea or vomiting Anesthetic complications: no   No complications documented.  Last Vitals:  Vitals:   06/23/20 1445 06/23/20 1528  BP: 128/60 (!) 138/55  Pulse: 63 70  Resp: 14 16  Temp:  36.9 C  SpO2: 98% 100%    Last Pain:  Vitals:   06/23/20 1528  TempSrc: Oral  PainSc:                  [name]

## 2020-06-23 NOTE — H&P (Signed)
Admitting Physician: Nickola Major Iisha Soyars  Service: General surgery  CC: hernia  Subjective   HPI: Audrey [name] is an 80 y.o. female who is here for elective open incisional hernia repair with mesh  Past Medical History:  Diagnosis Date  . Anemia   . Arthritis    lower back, hips, hands  . Biliary stricture   . Diabetes mellitus ([place])    type 2   . Early cataracts, bilateral    Md just watching  . Elevated liver enzymes   . Family history of adverse reaction to anesthesia    Daughter hard to wake up  . Gallstones   . GERD (gastroesophageal reflux disease)    occasional - diet controlled  . History of blood transfusion 2018  . History of hiatal hernia   . HTN (hypertension)   . Hyperlipidemia   . Hypothyroidism   . left renal ca dx'd 2018   renal cancer - left kidney removed, pill chemo x 1 yr  . Myocardial infarction ([place]) 1991   no deficits  . SVD (spontaneous vaginal delivery)    x 3  . Wears glasses     Past Surgical History:  Procedure Laterality Date  . BALLOON DILATION N/A 07/23/2018   Procedure: BALLOON DILATION;  Surgeon: Rush Landmark Telford Nab., MD;  Location: [place];  Service: Gastroenterology;  Laterality: N/A;  . BILIARY BRUSHING  08/10/2018   Procedure: BILIARY BRUSHING;  Surgeon: Rush Landmark Telford Nab., MD;  Location: [place];  Service: Gastroenterology;;  . BILIARY BRUSHING  11/30/2018   Procedure: BILIARY BRUSHING;  Surgeon: Irving Copas., MD;  Location: [place];  Service: Gastroenterology;;  . BILIARY DILATION  08/10/2018   Procedure: BILIARY DILATION;  Surgeon: Irving Copas., MD;  Location: [place];  Service: Gastroenterology;;  . BILIARY DILATION  08/27/2018   Procedure: BILIARY DILATION;  Surgeon: Irving Copas., MD;  Location: [place];  Service: Gastroenterology;;  . BILIARY DILATION  01/24/2020   Procedure: BILIARY DILATION;  Surgeon: Irving Copas., MD;  Location: [place];   Service: Gastroenterology;;  . BILIARY STENT PLACEMENT  08/10/2018   Procedure: BILIARY STENT PLACEMENT;  Surgeon: Irving Copas., MD;  Location: [place];  Service: Gastroenterology;;  . BILIARY STENT PLACEMENT  08/27/2018   Procedure: BILIARY STENT PLACEMENT;  Surgeon: Irving Copas., MD;  Location: [place];  Service: Gastroenterology;;  . BILIARY STENT PLACEMENT  11/30/2018   Procedure: BILIARY STENT PLACEMENT;  Surgeon: Irving Copas., MD;  Location: [place];  Service: Gastroenterology;;  . BIOPSY  07/23/2018   Procedure: BIOPSY;  Surgeon: Irving Copas., MD;  Location: [place];  Service: Gastroenterology;;  . BIOPSY  01/24/2020   Procedure: BIOPSY;  Surgeon: Irving Copas., MD;  Location: [place];  Service: Gastroenterology;;  . CHOLECYSTECTOMY N/A 09/02/2018   Procedure: LAPAROSCOPIC CHOLECYSTECTOMY;  Surgeon: Stark Klein, MD;  Location: [place];  Service: General;  Laterality: N/A;  . COLONOSCOPY     normal   . ENDOSCOPIC RETROGRADE CHOLANGIOPANCREATOGRAPHY (ERCP) WITH PROPOFOL N/A 08/10/2018   Procedure: ENDOSCOPIC RETROGRADE CHOLANGIOPANCREATOGRAPHY (ERCP) WITH PROPOFOL;  Surgeon: Irving Copas., MD;  Location: [place];  Service: Gastroenterology;  Laterality: N/A;  . ENDOSCOPIC RETROGRADE CHOLANGIOPANCREATOGRAPHY (ERCP) WITH PROPOFOL N/A 11/30/2018   Procedure: ENDOSCOPIC RETROGRADE CHOLANGIOPANCREATOGRAPHY (ERCP) WITH PROPOFOL;  Surgeon: Rush Landmark Telford Nab., MD;  Location: [place];  Service: Gastroenterology;  Laterality: N/A;  . ENDOSCOPIC RETROGRADE CHOLANGIOPANCREATOGRAPHY (ERCP) WITH PROPOFOL N/A 01/24/2020   Procedure: ENDOSCOPIC RETROGRADE CHOLANGIOPANCREATOGRAPHY (ERCP) WITH  PROPOFOL;  Surgeon: Irving Copas., MD;  Location: [place];  Service: Gastroenterology;  Laterality: N/A;  . ERCP N/A 07/23/2018   Procedure: ENDOSCOPIC RETROGRADE CHOLANGIOPANCREATOGRAPHY (ERCP);  Surgeon:  Irving Copas., MD;  Location: [place];  Service: Gastroenterology;  Laterality: N/A;  . ERCP N/A 08/27/2018   Procedure: ENDOSCOPIC RETROGRADE CHOLANGIOPANCREATOGRAPHY (ERCP);  Surgeon: Irving Copas., MD;  Location: [place];  Service: Gastroenterology;  Laterality: N/A;  . ESOPHAGOGASTRODUODENOSCOPY N/A 01/24/2020   Procedure: ESOPHAGOGASTRODUODENOSCOPY (EGD);  Surgeon: Irving Copas., MD;  Location: [place];  Service: Gastroenterology;  Laterality: N/A;  . ESOPHAGOGASTRODUODENOSCOPY (EGD) WITH PROPOFOL N/A 08/27/2018   Procedure: ESOPHAGOGASTRODUODENOSCOPY (EGD) WITH PROPOFOL;  Surgeon: Rush Landmark Telford Nab., MD;  Location: [place];  Service: Gastroenterology;  Laterality: N/A;  . EUS N/A 08/27/2018   Procedure: ESOPHAGEAL ENDOSCOPIC ULTRASOUND (EUS) RADIAL;  Surgeon: Rush Landmark Telford Nab., MD;  Location: [place];  Service: Gastroenterology;  Laterality: N/A;  . FINE NEEDLE ASPIRATION  08/27/2018   Procedure: FINE NEEDLE ASPIRATION (FNA) LINEAR;  Surgeon: Irving Copas., MD;  Location: [place];  Service: Gastroenterology;;  . IR IMAGING GUIDED PORT INSERTION  01/08/2018  . LAPAROSCOPIC NEPHRECTOMY Left 08/01/2016   Procedure: LAPAROSCOPIC  RADICAL NEPHRECTOMY/ REPAIR OF UMBILICAL HERNIA;  Surgeon: Raynelle Bring, MD;  Location: [facility];  Service: Urology;  Laterality: Left;  . REMOVAL OF STONES  07/23/2018   Procedure: REMOVAL OF GALL STONES;  Surgeon: Rush Landmark Telford Nab., MD;  Location: [place];  Service: Gastroenterology;;  . REMOVAL OF STONES  08/10/2018   Procedure: REMOVAL OF STONES;  Surgeon: Irving Copas., MD;  Location: [place];  Service: Gastroenterology;;  . REMOVAL OF STONES  08/27/2018   Procedure: REMOVAL OF STONES;  Surgeon: Irving Copas., MD;  Location: [place];  Service: Gastroenterology;;  . REMOVAL OF STONES  01/24/2020   Procedure: REMOVAL OF STONES;  Surgeon: Irving Copas.,  MD;  Location: [place];  Service: Gastroenterology;;  . [name]  07/23/2018   Procedure: [name];  Surgeon: Irving Copas., MD;  Location: [place];  Service: Gastroenterology;;  . Lavell Islam REMOVAL  08/27/2018   Procedure: STENT REMOVAL;  Surgeon: Irving Copas., MD;  Location: [place];  Service: Gastroenterology;;  . Lavell Islam REMOVAL  11/30/2018   Procedure: STENT REMOVAL;  Surgeon: Irving Copas., MD;  Location: [place];  Service: Gastroenterology;;  . Lavell Islam REMOVAL  01/24/2020   Procedure: STENT REMOVAL;  Surgeon: Irving Copas., MD;  Location: [place];  Service: Gastroenterology;;  . UPPER GI ENDOSCOPY     x 1  . VAGINAL PROLAPSE REPAIR  11/18/2019   [facility]    Family History  Problem Relation Age of Onset  . Heart attack Father 15  . Heart disease Brother 69       CABG  . Colon cancer Neg Hx   . Esophageal cancer Neg Hx   . Inflammatory bowel disease Neg Hx   . Liver disease Neg Hx   . Pancreatic cancer Neg Hx   . Rectal cancer Neg Hx   . Stomach cancer Neg Hx     Social:  reports that she quit smoking about 56 years ago. Her smoking use included cigarettes. She has a 8.00 pack-year smoking history. She has never used smokeless tobacco. She reports that she does not drink alcohol and does not use drugs.  Allergies:  Allergies  Allergen Reactions  . Adhesive [Tape] Other (See Comments)    Tears skin off Paper tape is ok  per patient  . Nsaids Other (See Comments)    Pt only has one kidney   . Statins Other (See Comments)    Liver/kidney issues.    Medications: Current Outpatient Medications  Medication Instructions  . amLODipine (NORVASC) 5 mg, Oral, Daily  . atorvastatin (LIPITOR) 80 mg, Oral, Daily at bedtime  . carvedilol (COREG) 6.25 mg, Oral, 2 times daily  . clobetasol ointment (TEMOVATE) 0.93 % 1 application, Topical, 2 times daily PRN  . Denosumab (XGEVA [place]) Subcutaneous, Every 6 weeks  .  diphenhydrAMINE (BENADRYL) 25 mg, Oral, Daily at bedtime  . glimepiride (AMARYL) 1 mg, Oral, Daily with breakfast  . hydrocortisone (CORTEF) 5-10 mg, Oral, See admin instructions, Take 1 tablet (10 mg) by mouth in the morning & 1/2 tablet (5 mg) by mouth in the evening.  Marland Kitchen levothyroxine (SYNTHROID) 75 mcg, Oral, Daily before breakfast  . Lidocaine 4 % PTCH 1 patch, Apply externally, Daily PRN  . lidocaine-prilocaine (EMLA) cream 1 application, Topical, Daily PRN  . Nivolumab (OPDIVO IV) 1 Dose, Intravenous, Every 14 days, [place]  . omeprazole (PRILOSEC) 20 mg, Oral, 2 times daily before meals  . sitaGLIPtin (JANUVIA) 50 mg, Oral, Daily    ROS - all of the below systems have been reviewed with the patient and positives are indicated with bold text General: chills, fever or night sweats Eyes: blurry vision or double vision ENT: epistaxis or sore throat Allergy/Immunology: itchy/watery eyes or nasal congestion Hematologic/Lymphatic: bleeding problems, blood clots or swollen lymph nodes Endocrine: temperature intolerance or unexpected weight changes Breast: new or changing breast lumps or nipple discharge Resp: cough, shortness of breath, or wheezing CV: chest pain or dyspnea on exertion GI: as per HPI GU: dysuria, trouble voiding, or hematuria MSK: joint pain or joint stiffness Neuro: TIA or stroke symptoms Derm: pruritus and skin lesion changes Psych: anxiety and depression  Objective   PE Blood pressure (!) 156/74, pulse 73, temperature 98.2 F (36.8 C), temperature source Oral, resp. rate 16, height 4\' 11"  (1.499 m), weight 60 kg, SpO2 100 %. Constitutional: NAD; conversant; no deformities Eyes: Moist conjunctiva; no lid lag; anicteric; PERRL Neck: Trachea midline; no thyromegaly Lungs: Normal respiratory effort; no tactile fremitus CV: RRR; no palpable thrills; no pitting edema GI: Abd palpable midline hernia; no palpable hepatosplenomegaly MSK: Normal range of  motion of extremities; no clubbing/cyanosis Psychiatric: Appropriate affect; alert and oriented x3 Lymphatic: No palpable cervical or axillary lymphadenopathy  Results for orders placed or performed during the hospital encounter of 06/23/20 (from the past 24 hour(s))  Glucose, capillary     Status: Abnormal   Collection Time: 06/23/20  8:58 AM  Result Value Ref Range   Glucose-Capillary 134 (H) 70 - 99 mg/dL   Comment 1 Notify RN     Imaging Orders  No imaging studies ordered today     Assessment and Plan   Audrey [name] is an 80 y.o. female with a palpable incisional hernia here for elective open repair.  The risks, benefits and alternatives have been reviewed and the patient granted consent to proceed.  Will proceed as scheduled.   Felicie Morn, MD  [facility] Use [email or web address] to contact on call provider

## 2020-06-23 NOTE — Transfer of Care (Signed)
Immediate Anesthesia Transfer of Care Note  Patient: Audrey [name]  Procedure(s) Performed: OPEN INCISIONAL HERNIA REPAIR WITH MESH (N/A )  Patient Location: PACU  Anesthesia Type:General  Level of Consciousness: awake, alert , oriented and patient cooperative  Airway & Oxygen Therapy: Patient Spontanous Breathing and Patient connected to face mask oxygen  Post-op Assessment: Report given to RN, Post -op Vital signs reviewed and stable and Patient moving all extremities  Post vital signs: Reviewed and stable  Last Vitals:  Vitals Value Taken Time  BP 146/74 06/23/20 1315  Temp 36.4 C 06/23/20 1313  Pulse 61 06/23/20 1319  Resp 16 06/23/20 1319  SpO2 100 % 06/23/20 1319  Vitals shown include unvalidated device data.  Last Pain:  Vitals:   06/23/20 0907  TempSrc: Oral  PainSc:          Complications: No complications documented.

## 2020-06-23 NOTE — Anesthesia Procedure Notes (Signed)
Procedure Name: Intubation Performed by: Saryiah Bencosme J, CRNA Pre-anesthesia Checklist: Patient identified, Emergency Drugs available, Suction available, Patient being monitored and Timeout performed Patient Re-evaluated:Patient Re-evaluated prior to induction Oxygen Delivery Method: Circle system utilized Preoxygenation: Pre-oxygenation with 100% oxygen Induction Type: IV induction Ventilation: Mask ventilation without difficulty Laryngoscope Size: Mac and 3 Grade View: Grade I Tube type: Oral Tube size: 7.0 mm Number of attempts: 1 Airway Equipment and Method: Stylet Placement Confirmation: ETT inserted through vocal cords under direct vision,  positive ETCO2 and breath sounds checked- equal and bilateral Secured at: 21 cm Tube secured with: Tape Dental Injury: Teeth and Oropharynx as per pre-operative assessment        

## 2020-06-23 NOTE — Op Note (Signed)
Patient: Audrey [name] (Oct 25, 1940, [id number])  Date of Surgery: 06/23/2020   Preoperative Diagnosis: INCISIONAL HERNIA   Postoperative Diagnosis: INCISIONAL HERNIA   Surgical Procedure: OPEN INCISIONAL HERNIA REPAIR WITH MESH:    Operative Team Members:  Surgeon(s) and Role:    * Mishelle Hassan, Nickola Major, MD - Primary   Anesthesiologist: [name], MD CRNA: Gean Maidens, CRNA; Mitzie Na, CRNA   Anesthesia: General   Fluids:  Total I/O In: 1000 [I.V.:1000] Out: 25 [FIEPP:29]  Complications: None  Drains:   19 Fr. Blake drain in the retromuscular position, exiting left mid abdomen  Specimen: none  Disposition:  PACU - hemodynamically stable.  Plan of Care: Admit for overnight observation  Indications for Procedure: Audrey [name] is a 80 y.o. female who presented with an incisional hernia.  The recommendation was made to proceed with open repair.  The risks, benefits and alternatives of surgery were discussed and the patient consented to proceed.  Findings:  Hernia Location: Ventral hernia location: Umbilical (M3) Hernia Size:  4 cm wide x 8 cm tall  Mesh Size &Type:  15 cm x 15 cm Bard Soft Mesh Mesh Position: [place] - Retromuscular Myofascial Releases: Bilateral Myofascial Release: Posterior rectus myofascial release   Description of Procedure:  The patient was positioned supine on the operating room table, adequately padded and secured.  A timeout procedure was performed.  A midline incision was made and dissection was carried down through subcutaneous tissue. The abdomen was entered safely.  There were some small adhesions between the abdominal wall and the omentum.  A meticulous and tedious sharp lysis of adhesions was carried out.  This was completed with no injury to the viscera.  After the anterior abdominal wall was cleared off of all adhesions, a large safety towel was placed over the viscera to protect them.  The hernia defect was located in the  umbilical region. The total hernia defect area was measured and the size was recorded above under findings.  A rectus myofascial release was performed on the LEFT side. The posterior rectus sheath was incised just lateral to the hernia edge.  This incision in the posterior rectus sheath was extended both cephalad and caudal to the hernia defect.  Dissection was carried out laterally in the retromuscular plane to the edge of the rectus sheath progressively disconnecting the rectus muscle from the underlying posterior rectus sheath. The segmental innervation comprised of intercostal nerve branches 8, 9, 10, 11 and 12 were individually identified and preserved.  The arterial blood supply to the rectus muscle comprised of the superior and inferior epigastric arteries along with the small terminal branches from the posterior intercostal arteries 10, 11 and 12, the subcostal artery, the posterior lumbar arteries, and the deep circumflex artery were all identified and individually preserved.  The rectus myofascial release accomplished medialization of the posterior rectus sheath towards the midline and disinsertion of the rectus muscle from its surrounding fascia, and thus its encasement in the rectus sheath, allowing for widening of the rectus muscle and transfer of the rectus flap towards the midline.  This will allow for future inset of the medial aspect of the flap for abdominal wall reconstruction.  A rectus myofascial release was performed on the RIGHT side. The posterior rectus sheath was incised just lateral to the hernia edge.  This incision in the posterior rectus sheath was extended both cephalad and caudal to the hernia defect.  Dissection was carried out laterally in the retromuscular plane  to the edge of the rectus sheath progressively disconnecting the rectus muscle from the underlying posterior rectus sheath. The segmental innervation comprised of intercostal nerve branches 8, 9, 10, 11 and 12 were  individually identified and preserved.  The arterial blood supply to the rectus muscle comprised of the superior and inferior epigastric arteries along with the small terminal branches from the posterior intercostal arteries 10, 11 and 12, the subcostal artery, the posterior lumbar arteries, and the deep circumflex artery were all identified and individually preserved.  The rectus myofascial release accomplished medialization of the posterior rectus sheath towards the midline and disinsertion of the rectus muscle from its surrounding fascia, and thus its encasement in the rectus sheath, allowing for widening of the rectus muscle and transfer of the rectus flap towards the midline.  This will allow for future inset of the medial aspect of the flap for abdominal wall reconstruction.  The towel was removed and the posterior fascia was reapproximated in the midline with a continuous 2-0 PDS suture. The retromuscular plane was copiously irrigated with warm normal saline. There was good hemostasis of the operative field.   Local anesthetic was injected in the rectus and subcutaneous tissues around the incision. The mesh listed above was brought into the operative field and cut to the size specified above.  The mesh was deployed into the retromuscular position where it conformed well to the space.  It did not require any fixation.  The mesh space was irrigated with saline.  One 24 [place] Blake drain was placed in the retromuscular position. The rectus muscles were re approximated in the midline utilizing a continuous 2-0 PDS suture taking 5 mm bites of the fascia with 5 mm advancement. The fascia came together well. The subcutaneous tissue was irrigated with saline. Scarpa's fascia was reapproximated with interrupted 2-0 Vicryl suture.  The subcuticular tissue was reapproximated with buried, interrupted 2-0 Vicryl suture.  The skin was closed with a 4-0 Monocryl subcuticular suture and skin glue.  All sponge and  needle counts were correct at the end of this case.   Louanna Raw, MD General, Bariatric, & Minimally Invasive Surgery [facility], [place]

## 2020-06-24 DIAGNOSIS — K449 Diaphragmatic hernia without obstruction or gangrene: Secondary | ICD-10-CM | POA: Diagnosis present

## 2020-06-24 DIAGNOSIS — Z79890 Hormone replacement therapy (postmenopausal): Secondary | ICD-10-CM | POA: Diagnosis not present

## 2020-06-24 DIAGNOSIS — Z91048 Other nonmedicinal substance allergy status: Secondary | ICD-10-CM | POA: Diagnosis not present

## 2020-06-24 DIAGNOSIS — Z886 Allergy status to analgesic agent status: Secondary | ICD-10-CM | POA: Diagnosis not present

## 2020-06-24 DIAGNOSIS — Z87891 Personal history of nicotine dependence: Secondary | ICD-10-CM | POA: Diagnosis not present

## 2020-06-24 DIAGNOSIS — Z905 Acquired absence of kidney: Secondary | ICD-10-CM | POA: Diagnosis not present

## 2020-06-24 DIAGNOSIS — Z79899 Other long term (current) drug therapy: Secondary | ICD-10-CM | POA: Diagnosis not present

## 2020-06-24 DIAGNOSIS — E039 Hypothyroidism, unspecified: Secondary | ICD-10-CM | POA: Diagnosis present

## 2020-06-24 DIAGNOSIS — I2510 Atherosclerotic heart disease of native coronary artery without angina pectoris: Secondary | ICD-10-CM | POA: Diagnosis present

## 2020-06-24 DIAGNOSIS — C7951 Secondary malignant neoplasm of bone: Secondary | ICD-10-CM | POA: Diagnosis present

## 2020-06-24 DIAGNOSIS — Z20822 Contact with and (suspected) exposure to covid-19: Secondary | ICD-10-CM | POA: Diagnosis present

## 2020-06-24 DIAGNOSIS — K2971 Gastritis, unspecified, with bleeding: Secondary | ICD-10-CM | POA: Diagnosis present

## 2020-06-24 DIAGNOSIS — K219 Gastro-esophageal reflux disease without esophagitis: Secondary | ICD-10-CM | POA: Diagnosis present

## 2020-06-24 DIAGNOSIS — I252 Old myocardial infarction: Secondary | ICD-10-CM | POA: Diagnosis not present

## 2020-06-24 DIAGNOSIS — E785 Hyperlipidemia, unspecified: Secondary | ICD-10-CM | POA: Diagnosis present

## 2020-06-24 DIAGNOSIS — K660 Peritoneal adhesions (postprocedural) (postinfection): Secondary | ICD-10-CM | POA: Diagnosis present

## 2020-06-24 DIAGNOSIS — Z8249 Family history of ischemic heart disease and other diseases of the circulatory system: Secondary | ICD-10-CM | POA: Diagnosis not present

## 2020-06-24 DIAGNOSIS — K432 Incisional hernia without obstruction or gangrene: Secondary | ICD-10-CM | POA: Diagnosis present

## 2020-06-24 DIAGNOSIS — I10 Essential (primary) hypertension: Secondary | ICD-10-CM | POA: Diagnosis present

## 2020-06-24 DIAGNOSIS — D649 Anemia, unspecified: Secondary | ICD-10-CM | POA: Diagnosis present

## 2020-06-24 DIAGNOSIS — M159 Polyosteoarthritis, unspecified: Secondary | ICD-10-CM | POA: Diagnosis present

## 2020-06-24 DIAGNOSIS — Z888 Allergy status to other drugs, medicaments and biological substances status: Secondary | ICD-10-CM | POA: Diagnosis not present

## 2020-06-24 DIAGNOSIS — Z85528 Personal history of other malignant neoplasm of kidney: Secondary | ICD-10-CM | POA: Diagnosis not present

## 2020-06-24 LAB — GLUCOSE, CAPILLARY
Glucose-Capillary: 106 mg/dL — ABNORMAL HIGH (ref 70–99)
Glucose-Capillary: 243 mg/dL — ABNORMAL HIGH (ref 70–99)
Glucose-Capillary: 119 mg/dL — ABNORMAL HIGH (ref 70–99)
Glucose-Capillary: 194 mg/dL — ABNORMAL HIGH (ref 70–99)

## 2020-06-24 NOTE — Evaluation (Signed)
Occupational Therapy Evaluation Patient Details Name: Audrey [name] MRN: [id number] DOB: Dec 01, 1940 Today's Date: 06/24/2020    History of Present Illness Audrey [name] is a 80 year old female s/p open Rives Stoppa incisional hernia repair with mesh on 06/23/2020 for an incisional hernia.   PMH: DN, anemia, MI, biliary stricture, L renal  carcinoma, L kidney removal.   Clinical Impression   Pt admitted with the above diagnoses and presents with below problem list. Pt will benefit from continued acute OT to address the below listed deficits and maximize independence with basic ADLs prior to d/c home. At baseline, pt is independent with ADLs, does endorse intermittent support of furniture walking in her home. Pt currently needs up to min guard A with LB ADLs, functional transfers and mobility. Discussed compensatory strategies for LB ADLs.     Follow Up Recommendations  No OT follow up;Supervision - Intermittent    Equipment Recommendations  3 in 1 bedside commode    Recommendations for Other Services       Precautions / Restrictions Precautions Precautions: Fall Precaution Comments: chemo, Abdominal binder, drain on L, use her slippers due to neuropathy Restrictions Weight Bearing Restrictions: No      Mobility Bed Mobility Overal bed mobility: Modified Independent             General bed mobility comments: discussed logrolling technique prior to bed mobility. Pt prefers EOB>supine. HOB elevated. Pt states she may sleep on couch for the first few nights at home    Transfers Overall transfer level: Needs assistance Equipment used: None;Rolling walker (2 wheeled) Transfers: Sit to/from Stand Sit to Stand: Min guard         General transfer comment: min guard for safety. to/from recliner and EOB. Utilized rw initially though noted to put that to the side to sit on the bed at end of session    Balance Overall balance assessment: Needs assistance Sitting-balance support: No  upper extremity supported;Feet supported Sitting balance-Leahy Scale: Good     Standing balance support: During functional activity Standing balance-Leahy Scale: Poor Standing balance comment: dynamic, relies on light support without Rw while standing at sink                           ADL either performed or assessed with clinical judgement   ADL Overall ADL's : Needs assistance/impaired Eating/Feeding: Set up;Sitting   Grooming: Min guard;Standing   Upper Body Bathing: Set up;Sitting   Lower Body Bathing: Min guard;Sit to/from stand   Upper Body Dressing : Set up;Sitting   Lower Body Dressing: Min guard;Sit to/from stand   Toilet Transfer: Min guard;Ambulation;RW;Comfort height toilet;Grab bars   Toileting- Clothing Manipulation and Hygiene: Min guard;Set up;Sitting/lateral lean;Sit to/from stand   Tub/ Shower Transfer: Walk-in shower;Min guard;Ambulation;Shower seat;Rolling walker   Functional mobility during ADLs: Min guard;Rolling walker General ADL Comments: Pt completed household distance functional mobility with extra time and min guard for safety. Pt currently prefers to use rw. Pt able to bring each foot to opposite for LB ADL access.     Vision Baseline Vision/History: Wears glasses       Perception     Praxis      Pertinent Vitals/Pain Pain Assessment: Faces Faces Pain Scale: Hurts little more Pain Location: neuropathy and abd. Pain Descriptors / Indicators: Cramping;Discomfort;Penetrating Pain Intervention(s): Limited activity within patient's tolerance;Monitored during session;Repositioned     Hand Dominance     Extremity/Trunk Assessment Upper Extremity Assessment  Upper Extremity Assessment: Overall WFL for tasks assessed;Generalized weakness   Lower Extremity Assessment Lower Extremity Assessment: Defer to PT evaluation RLE Sensation: history of peripheral neuropathy LLE Sensation: history of peripheral neuropathy   Cervical /  Trunk Assessment Cervical / Trunk Assessment: Normal   Communication Communication Communication: No difficulties   Cognition Arousal/Alertness: Awake/alert Behavior During Therapy: WFL for tasks assessed/performed Overall Cognitive Status: Within Functional Limits for tasks assessed                                     General Comments  benefits from RW    Exercises     Shoulder Instructions      Home Living Family/patient expects to be discharged to:: Private residence Living Arrangements: Spouse/significant other Available Help at Discharge: Family;Available 24 hours/day Type of Home: House Home Access: Stairs to enter [employer] of Steps: 1   Home Layout: One level     Bathroom Shower/Tub: Teacher, early years/pre: Handicapped height     Home Equipment: Shower seat - built in          Prior Functioning/Environment Level of Independence: Independent        Comments: reports holds onto walls, etc in hose, drives        OT Problem List: Impaired balance (sitting and/or standing);Decreased knowledge of use of DME or AE;Decreased knowledge of precautions;Pain      OT Treatment/Interventions: Self-care/ADL training;DME and/or AE instruction;Therapeutic activities;Patient/family education;Balance training    OT Goals(Current goals can be found in the care plan section) Acute Rehab OT Goals Patient Stated Goal: go home OT Goal Formulation: With patient Time For Goal Achievement: 07/08/20 Potential to Achieve Goals: Good ADL Goals Pt Will Perform Lower Body Bathing: with modified independence;sit to/from stand Pt Will Perform Lower Body Dressing: with modified independence;sit to/from stand Pt Will Transfer to Toilet: with modified independence;ambulating Pt Will Perform Toileting - Clothing Manipulation and hygiene: with modified independence;sit to/from stand Pt Will Perform Tub/Shower Transfer: with modified  independence;ambulating;shower seat;rolling walker  OT Frequency: Min 2X/week   Barriers to D/C:            Co-evaluation              AM-PAC OT "6 Clicks" Daily Activity     Outcome Measure Help from another person eating meals?: None Help from another person taking care of personal grooming?: None Help from another person toileting, which includes using toliet, bedpan, or urinal?: A Little Help from another person bathing (including washing, rinsing, drying)?: A Little Help from another person to put on and taking off regular upper body clothing?: None Help from another person to put on and taking off regular lower body clothing?: A Little 6 Click Score: 21   End of Session Equipment Utilized During Treatment: Rolling walker;Other (comment) (abdominal binder on) Nurse Communication: Mobility status  Activity Tolerance: Patient tolerated treatment well Patient left: in bed;with call bell/phone within reach;with nursing/sitter in room  OT Visit Diagnosis: Unsteadiness on feet (R26.81)                Time: 9628-3662 OT Time Calculation (min): 19 min Charges:  OT General Charges $OT Visit: 1 Visit OT Evaluation $OT Eval Low Complexity: [place], OT Acute Rehabilitation Services Pager: [phone] Office: [phone]   Hortencia Pilar 06/24/2020, 12:22 PM

## 2020-06-24 NOTE — Progress Notes (Signed)
Progress Note: General Surgery Service   Chief Complaint/Subjective: Doing well.  A little sore, but not if she doesn't move.  Wearing abdominal binder.  Objective: Vital signs in last 24 hours: Temp:  [97.5 F (36.4 C)-99.1 F (37.3 C)] 98.3 F (36.8 C) (03/05 0634) Pulse Rate:  [57-73] 63 (03/05 0634) Resp:  [9-18] 16 (03/05 0634) BP: (114-156)/(54-92) 131/55 (03/05 0634) SpO2:  [95 %-100 %] 99 % (03/05 0634) Weight:  [60 kg] 60 kg (03/04 0906) Last BM Date: 06/23/20  Intake/Output from previous day: 03/04 0701 - 03/05 0700 In: 1194.6 [I.V.:1094.6; IV Piggyback:100] Out: 1245 [Urine:1100; Drains:120; Blood:25] Intake/Output this shift: No intake/output data recorded.  Constitutional: NAD; conversant; no deformities Eyes: Moist conjunctiva; no lid lag; anicteric; PERRL Neck: Trachea midline; no thyromegaly Lungs: Normal respiratory effort; no tactile fremitus CV: RRR; no palpable thrills; no pitting edema GI: Abd soft, non-tender, non-distended, incision c/d/i w/ glue, JP serosanguinous; no palpable hepatosplenomegaly MSK: Normal range of motion of extremities; no clubbing/cyanosis Psychiatric: Appropriate affect; alert and oriented x3 Lymphatic: No palpable cervical or axillary lymphadenopathy  Lab Results: CBC  Recent Labs    06/21/20 0833 06/23/20 1600  WBC 8.9 11.4*  HGB 11.5* 11.0*  HCT 36.4 36.5  PLT 289 278   BMET Recent Labs    06/21/20 0833 06/23/20 1600  NA 137  --   K 4.4  --   CL 111  --   CO2 16*  --   GLUCOSE 144*  --   BUN 27*  --   CREATININE 2.13* 1.88*  CALCIUM 9.5  --    PT/INR No results for input(s): LABPROT, INR in the last 72 hours. ABG No results for input(s): PHART, HCO3 in the last 72 hours.  Invalid input(s): PCO2, PO2  Anti-infectives: Anti-infectives (From admission, onward)   Start     Dose/Rate Route Frequency Ordered Stop   06/23/20 0845  ceFAZolin (ANCEF) IVPB 2g/100 mL premix        2 g 200 mL/hr over 30  Minutes Intravenous On call to O.R. 06/23/20 0843 06/23/20 1126      Medications: Scheduled Meds: . acetaminophen  650 mg Oral Q6H  . amLODipine  5 mg Oral Daily  . atorvastatin  80 mg Oral QHS  . carvedilol  6.25 mg Oral BID  . diphenhydrAMINE  25 mg Oral QHS  . heparin injection (subcutaneous)  5,000 Units Subcutaneous Q8H  . hydrocortisone  10 mg Oral Daily  . hydrocortisone  5 mg Oral QAC supper  . insulin aspart  0-15 Units Subcutaneous TID WC  . insulin aspart  0-5 Units Subcutaneous QHS  . levothyroxine  75 mcg Oral QAC breakfast  . pantoprazole  40 mg Oral Daily   Continuous Infusions: . lactated ringers 50 mL/hr at 06/23/20 1603   PRN Meds:.ondansetron **OR** ondansetron (ZOFRAN) IV, oxyCODONE  Assessment/Plan: Ms. Spickard is a 80 year old female s/p open Rives Stoppa incisional hernia repair with mesh on 06/23/2020 for an incisional hernia.  Hernia repair as above - Continue regular diet - Pain control - Increase activity - work with PT/OT - Remove JP on the day of discharge - Awaiting return of some flatus or bowel function    LOS: 0 days   FEN: d/c IVF, regular diet ID: None VTE: Scds, heparin Foley: none Dispo: home possibly Sunday    Paul J Stechschulte, MD  [facility] Use [email or web address] to contact on call provider

## 2020-06-24 NOTE — Evaluation (Signed)
Physical Therapy Evaluation Patient Details Name: Audrey [name] MRN: [id number] DOB: 22-Jun-1940 Today's Date: 06/24/2020   History of Present Illness  Audrey [name] is a 80 year old female s/p open Rives Stoppa incisional hernia repair with mesh on 06/23/2020 for an incisional hernia.   PMH: DN, anemia, MI, biliary stricture, L renal  carcinoma, L kidney removal.  Clinical Impression  The patient ambulated in room and x 200' in hall using RW. Patient 's balance impaired due to neuropathy. Patient declines need for AD, however, PT recommends a RW(Rollator). Patient reports holds to "things" in her home when walking. Patient drives.Pt admitted with above diagnosis.  Pt currently with functional limitations due to the deficits listed below (see PT Problem List). Pt will benefit from skilled PT to increase their independence and safety with mobility to allow discharge to the venue listed below.       Follow Up Recommendations No PT follow up    Equipment Recommendations  Rolling walker with 5" wheels (recommend a rollator , patient declines.)    Recommendations for Other Services       Precautions / Restrictions Precautions Precautions: Fall Precaution Comments: chemo, Abdominal binder, , drain on L, use her slippers due to neuropathy      Mobility  Bed Mobility Overal bed mobility: Modified Independent             General bed mobility comments: HOB raised, use of rail., cues to try rolling technique    Transfers Overall transfer level: Needs assistance Equipment used: None;Rolling walker (2 wheeled) Transfers: Sit to/from Stand Sit to Stand: Min assist         General transfer comment: unsteady to stand from bed withput support. Offered Rw for stability which improved when standing from toilet and from recliner.  Ambulation/Gait Ambulation/Gait assistance: Min assist Gait Distance (Feet): 25 Feet (then 200') Assistive device: Rolling walker (2 wheeled) Gait  Pattern/deviations: Step-through pattern;Staggering right;Staggering left Gait velocity: decr   General Gait Details: ambulated x 10' without Rw, patient reaching for objects to stabilize. Provided Rw for further distance  Stairs            Wheelchair Mobility    Modified Rankin (Stroke Patients Only)       Balance Overall balance assessment: Needs assistance Sitting-balance support: No upper extremity supported;Feet supported Sitting balance-Leahy Scale: Good     Standing balance support: During functional activity Standing balance-Leahy Scale: Poor Standing balance comment: dynamic, relies on light support without Rw while standing at sink                             Pertinent Vitals/Pain Pain Assessment: Faces Faces Pain Scale: Hurts little more Pain Location: neuropathy and abd. Pain Descriptors / Indicators: Cramping;Discomfort;Penetrating Pain Intervention(s): Limited activity within patient's tolerance;Monitored during session;Premedicated before session    Home Living Family/patient expects to be discharged to:: Private residence Living Arrangements: Spouse/significant other Available Help at Discharge: Family;Available 24 hours/day Type of Home: House Home Access: Stairs to enter   [employer] of Steps: 1 Home Layout: One level Home Equipment: None      Prior Function Level of Independence: Independent         Comments: reports holds onto walls, etc in hose, drives     Hand Dominance        Extremity/Trunk Assessment        Lower Extremity Assessment Lower Extremity Assessment: Generalized weakness;RLE deficits/detail;LLE deficits/detail RLE Sensation:  history of peripheral neuropathy LLE Sensation: history of peripheral neuropathy    Cervical / Trunk Assessment Cervical / Trunk Assessment: Normal  Communication   Communication: No difficulties  Cognition Arousal/Alertness: Awake/alert Behavior During  Therapy: WFL for tasks assessed/performed Overall Cognitive Status: Within Functional Limits for tasks assessed                                        General Comments General comments (skin integrity, edema, etc.): benefits from RW    Exercises     Assessment/Plan    PT Assessment Patient needs continued PT services  PT Problem List Decreased strength;Decreased mobility       PT Treatment Interventions DME instruction;Therapeutic activities;Gait training;Patient/family education;Therapeutic exercise;Functional mobility training    PT Goals (Current goals can be found in the Care Plan section)  Acute Rehab PT Goals Patient Stated Goal: go home PT Goal Formulation: With patient Time For Goal Achievement: 07/08/20 Potential to Achieve Goals: Good    Frequency Min 3X/week   Barriers to discharge        Co-evaluation               AM-PAC PT "6 Clicks" Mobility  Outcome Measure Help needed turning from your back to your side while in a flat bed without using bedrails?: A Little Help needed moving from lying on your back to sitting on the side of a flat bed without using bedrails?: A Little Help needed moving to and from a bed to a chair (including a wheelchair)?: A Little Help needed standing up from a chair using your arms (e.g., wheelchair or bedside chair)?: A Little Help needed to walk in hospital room?: A Little Help needed climbing 3-5 steps with a railing? : A Little 6 Click Score: 18    End of Session Equipment Utilized During Treatment: Gait belt Activity Tolerance: Patient tolerated treatment well Patient left: in chair;with call bell/phone within reach Nurse Communication: Mobility status PT Visit Diagnosis: Unsteadiness on feet (R26.81);Other symptoms and signs involving the nervous system (R29.898);Difficulty in walking, not elsewhere classified (R26.2)    Time: 0912-0956 PT Time Calculation (min) (ACUTE ONLY): 44 min   Charges:    PT Evaluation $PT Eval Low Complexity: 1 Low PT Treatments $Gait Training: 8-22 mins $Self Care/Home Management: [place] Pager [phone] Office [phone]   Claretha Cooper 06/24/2020, 10:55 AM

## 2020-06-25 LAB — GLUCOSE, CAPILLARY
Glucose-Capillary: 110 mg/dL — ABNORMAL HIGH (ref 70–99)
Glucose-Capillary: 277 mg/dL — ABNORMAL HIGH (ref 70–99)

## 2020-06-25 MED ORDER — OXYCODONE HCL 5 MG PO TABS: 5.0000 mg | ORAL_TABLET | ORAL | 0 refills | Status: DC | PRN

## 2020-06-25 NOTE — Discharge Instructions (Signed)
[facility] _______[facility] ° °HERNIA REPAIR: POST OP INSTRUCTIONS ° °Always review your discharge instruction sheet given to you by the facility where your surgery was performed. °IF YOU HAVE DISABILITY OR FAMILY LEAVE FORMS, YOU MUST BRING THEM TO THE OFFICE FOR PROCESSING.   °DO NOT GIVE THEM TO YOUR DOCTOR. ° °1. A  prescription for pain medication may be given to you upon discharge.  Take your pain medication as prescribed, if needed.  If narcotic pain medicine is not needed, then you may take acetaminophen (Tylenol) or ibuprofen (Advil) as needed. °2. Take your usually prescribed medications unless otherwise directed. °If you need a refill on your pain medication, please contact your pharmacy.  They will contact our office to request authorization. Prescriptions will not be filled after 5 pm or on week-ends. °3. You should follow a light diet the first 24 hours after arrival home, such as soup and crackers, etc.  Be sure to include lots of fluids daily.  Resume your normal diet the day after surgery. °4.Most patients will experience some swelling and bruising around the umbilicus or in the groin and scrotum.  Ice packs and reclining will help.  Swelling and bruising can take several days to resolve.  °6. It is common to experience some constipation if taking pain medication after surgery.  Increasing fluid intake and taking a stool softener (such as Colace) will usually help or prevent this problem from occurring.  A mild laxative (Milk of Magnesia or Miralax) should be taken according to package directions if there are no bowel movements after 48 hours. °7. Unless discharge instructions indicate otherwise, you may remove your bandages 24-48 hours after surgery, and you may shower at that time.  You may have steri-strips (small skin tapes) in place directly over the incision.  These strips should be left on the skin for 7-10 days.  If your surgeon used skin glue on the incision, you may shower in  24 hours.  The glue will flake off over the next 2-3 weeks.  Any sutures or staples will be removed at the office during your follow-up visit. °8. ACTIVITIES:  You may resume regular (light) daily activities beginning the next day--such as daily self-care, walking, climbing stairs--gradually increasing activities as tolerated.  You may have sexual intercourse when it is comfortable.  Refrain from any heavy lifting or straining until approved by your doctor. ° °a.You may drive when you are no longer taking prescription pain medication, you can comfortably wear a seatbelt, and you can safely maneuver your car and apply brakes. °b.RETURN TO WORK:   °_____________________________________________ ° °9.You should see your doctor in the office for a follow-up appointment approximately 2-3 weeks after your surgery.  Make sure that you call for this appointment within a day or two after you arrive home to insure a convenient appointment time. °10.OTHER INSTRUCTIONS: _________________________ °   _____________________________________ ° °WHEN TO CALL YOUR DOCTOR: °1. Fever over 101.0 °2. Inability to urinate °3. Nausea and/or vomiting °4. Extreme swelling or bruising °5. Continued bleeding from incision. °6. Increased pain, redness, or drainage from the incision ° °The clinic staff is available to answer your questions during regular business hours.  Please don’t hesitate to call and ask to speak to one of the nurses for clinical concerns.  If you have a medical emergency, go to the nearest emergency room or call 911.  A surgeon from [facility] is always on call at the hospital ° ° °[address], [address], [place], [place]  [address] ? ° [address] [address], [place], [place]   [address] °[phone] ? [phone] ? FAX [phone] °Web site: [email or web address] ° °

## 2020-06-25 NOTE — Progress Notes (Signed)
Assessment unchanged. Pt verbalized understanding of dc instruction through teach back. Discharged via wc to front entrance accompanied by NT.

## 2020-06-26 ENCOUNTER — Encounter (HOSPITAL_COMMUNITY): Payer: Self-pay | Admitting: Surgery

## 2020-06-27 NOTE — Discharge Summary (Signed)
Patient ID: Audrey [name] [id number] 79 y.o. 1940/04/25  06/23/2020  Discharge date and time: 06/27/2020  Admitting Physician: [place]  Discharge Physician: [place]  Admission Diagnoses: Incisional hernia [K43.2] Patient Active Problem List   Diagnosis Date Noted  . Incisional hernia 06/23/2020  . Gastritis and gastroduodenitis 02/26/2020  . Ventral hernia without obstruction or gangrene 02/26/2020  . Rectal discomfort 02/26/2020  . History of colonic polyps 02/26/2020  . History of cholecystectomy 07/17/2019  . Anemia 10/10/2018  . 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 Acute cholecystitis 08/29/2018  . Constipation 08/23/2018  . Abdominal pain 08/23/2018  . 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 Preop cardiovascular exam 08/14/2018  . Coronary artery disease involving native coronary artery of native heart without angina pectoris 08/14/2018  . Dyslipidemia 08/14/2018  . Abnormal LFTs 08/05/2018  . Choledocholithiasis 08/05/2018  . Dilated bile duct 08/05/2018  . History of ERCP 08/05/2018  . History of renal cell carcinoma 08/05/2018  . Port-A-Cath in place 01/30/2018  . Counseling regarding advance care planning and goals of care 01/04/2018  . Renal cell carcinoma ([place]) 04/11/2017  . 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 Left renal mass 08/01/2016     Discharge Diagnoses: Incisional hernia Patient Active Problem List   Diagnosis Date Noted  . Incisional hernia 06/23/2020  . Gastritis and gastroduodenitis 02/26/2020  . Ventral hernia without obstruction or gangrene 02/26/2020  . Rectal discomfort 02/26/2020  . History of colonic polyps 02/26/2020  . History of cholecystectomy 07/17/2019  . Anemia 10/10/2018  . 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 Acute cholecystitis 08/29/2018  . Constipation 08/23/2018  . Abdominal pain 08/23/2018  . 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 Preop cardiovascular exam 08/14/2018  . Coronary artery disease  involving native coronary artery of native heart without angina pectoris 08/14/2018  . Dyslipidemia 08/14/2018  . Abnormal LFTs 08/05/2018  . Choledocholithiasis 08/05/2018  . Dilated bile duct 08/05/2018  . History of ERCP 08/05/2018  . History of renal cell carcinoma 08/05/2018  . Port-A-Cath in place 01/30/2018  . Counseling regarding advance care planning and goals of care 01/04/2018  . Renal cell carcinoma ([place]) 04/11/2017  . 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 Left renal mass 08/01/2016    Operations: Procedure(s): OPEN INCISIONAL HERNIA REPAIR WITH MESH  Admission Condition: good  Discharged Condition: good  Indication for Admission: Incisional hernia  Hospital Course: Audrey [name] is a 80 year old with an incisional hernia.  She presented for open elective repair on 06/23/2020, recovered well and was discharged on 06/25/2020.  Consults: None  Significant Diagnostic Studies: None  Treatments: surgery: open incisional hernia repair with mesh 06/23/2020  Disposition: Home  Patient Instructions:  Allergies as of 06/25/2020      Reactions   Adhesive [tape] Other (See Comments)   Tears skin off Paper tape is ok per patient   Nsaids Other (See Comments)   Pt only has one kidney    Statins Other (See Comments)   Liver/kidney issues.      Medication List    TAKE these medications   amLODipine 5 MG tablet Commonly known as: NORVASC Take 5 mg by mouth daily.   atorvastatin 80 MG tablet Commonly known as: LIPITOR Take 80 mg by mouth at bedtime.   carvedilol 6.25 MG tablet Commonly known as: COREG Take 6.25 mg by mouth 2 (two) times daily.   clobetasol ointment 0.05 % Commonly known as: TEMOVATE Apply 1 application topically 2 (two) times daily as needed (lichen  sclerosis).   diphenhydrAMINE 25 MG tablet Commonly known as: BENADRYL Take 25 mg by mouth at bedtime.   glimepiride 1 MG tablet Commonly known as: AMARYL Take 1 mg by mouth daily with  breakfast.   hydrocortisone 10 MG tablet Commonly known as: CORTEF Take 5-10 mg by mouth See admin instructions. Take 1 tablet (10 mg) by mouth in the morning & 1/2 tablet (5 mg) by mouth in the evening.   levothyroxine 75 MCG tablet Commonly known as: SYNTHROID Take 75 mcg by mouth daily before breakfast.   Lidocaine 4 % Ptch Apply 1 patch topically daily as needed (pain).   lidocaine-prilocaine cream Commonly known as: EMLA Apply 1 application topically daily as needed (prior to port access).   omeprazole 20 MG capsule Commonly known as: PRILOSEC Take 1 capsule (20 mg total) by mouth 2 (two) times daily before a meal.   OPDIVO IV Inject 1 Dose into the vein every 14 (fourteen) days. [place]   oxyCODONE 5 MG immediate release tablet Commonly known as: Oxy IR/ROXICODONE Take 1 tablet (5 mg total) by mouth every 4 (four) hours as needed for severe pain.   sitaGLIPtin 50 MG tablet Commonly known as: JANUVIA Take 50 mg by mouth daily.   XGEVA [place] Inject into the skin every 6 (six) weeks.       Activity: no heavy lifting for 4 weeks Diet: regular diet Wound Care: keep wound clean and dry  Follow-up:  With Dr. Thermon Leyland in 3 weeks.  Signed: Nickola Major [facility] [place]   06/27/2020, 8:51 AM

## 2020-06-28 ENCOUNTER — Telehealth: Payer: Self-pay | Admitting: Hematology

## 2020-06-28 NOTE — Telephone Encounter (Signed)
Returned call to reschedule upcoming appointment. Patient requested 8am appointments. Patient is aware of changes.

## 2020-07-05 ENCOUNTER — Inpatient Hospital Stay: Payer: Medicare Other

## 2020-07-05 ENCOUNTER — Other Ambulatory Visit: Payer: Self-pay

## 2020-07-05 VITALS — BP 134/67 | HR 63 | Temp 98.6°F | Resp 18 | Ht 59.0 in | Wt 131.8 lb

## 2020-07-05 DIAGNOSIS — Z95828 Presence of other vascular implants and grafts: Secondary | ICD-10-CM

## 2020-07-05 DIAGNOSIS — G629 Polyneuropathy, unspecified: Secondary | ICD-10-CM | POA: Diagnosis not present

## 2020-07-05 DIAGNOSIS — K449 Diaphragmatic hernia without obstruction or gangrene: Secondary | ICD-10-CM | POA: Diagnosis not present

## 2020-07-05 DIAGNOSIS — C7951 Secondary malignant neoplasm of bone: Secondary | ICD-10-CM

## 2020-07-05 DIAGNOSIS — I252 Old myocardial infarction: Secondary | ICD-10-CM | POA: Diagnosis not present

## 2020-07-05 DIAGNOSIS — E538 Deficiency of other specified B group vitamins: Secondary | ICD-10-CM | POA: Diagnosis not present

## 2020-07-05 DIAGNOSIS — Z87891 Personal history of nicotine dependence: Secondary | ICD-10-CM | POA: Diagnosis not present

## 2020-07-05 DIAGNOSIS — R948 Abnormal results of function studies of other organs and systems: Secondary | ICD-10-CM | POA: Diagnosis not present

## 2020-07-05 DIAGNOSIS — C642 Malignant neoplasm of left kidney, except renal pelvis: Secondary | ICD-10-CM

## 2020-07-05 DIAGNOSIS — C7971 Secondary malignant neoplasm of right adrenal gland: Secondary | ICD-10-CM | POA: Diagnosis not present

## 2020-07-05 DIAGNOSIS — E119 Type 2 diabetes mellitus without complications: Secondary | ICD-10-CM | POA: Diagnosis not present

## 2020-07-05 DIAGNOSIS — E440 Moderate protein-calorie malnutrition: Secondary | ICD-10-CM | POA: Diagnosis not present

## 2020-07-05 DIAGNOSIS — Z79899 Other long term (current) drug therapy: Secondary | ICD-10-CM | POA: Diagnosis not present

## 2020-07-05 DIAGNOSIS — R918 Other nonspecific abnormal finding of lung field: Secondary | ICD-10-CM | POA: Diagnosis not present

## 2020-07-05 DIAGNOSIS — M1990 Unspecified osteoarthritis, unspecified site: Secondary | ICD-10-CM | POA: Diagnosis not present

## 2020-07-05 DIAGNOSIS — I10 Essential (primary) hypertension: Secondary | ICD-10-CM | POA: Diagnosis not present

## 2020-07-05 DIAGNOSIS — Z5112 Encounter for antineoplastic immunotherapy: Secondary | ICD-10-CM

## 2020-07-05 DIAGNOSIS — K649 Unspecified hemorrhoids: Secondary | ICD-10-CM | POA: Diagnosis not present

## 2020-07-05 DIAGNOSIS — K219 Gastro-esophageal reflux disease without esophagitis: Secondary | ICD-10-CM | POA: Diagnosis not present

## 2020-07-05 DIAGNOSIS — C7972 Secondary malignant neoplasm of left adrenal gland: Secondary | ICD-10-CM | POA: Diagnosis not present

## 2020-07-05 DIAGNOSIS — E785 Hyperlipidemia, unspecified: Secondary | ICD-10-CM | POA: Diagnosis not present

## 2020-07-05 DIAGNOSIS — C7800 Secondary malignant neoplasm of unspecified lung: Secondary | ICD-10-CM | POA: Diagnosis not present

## 2020-07-05 DIAGNOSIS — I2510 Atherosclerotic heart disease of native coronary artery without angina pectoris: Secondary | ICD-10-CM | POA: Diagnosis not present

## 2020-07-05 DIAGNOSIS — Z7189 Other specified counseling: Secondary | ICD-10-CM

## 2020-07-05 DIAGNOSIS — E039 Hypothyroidism, unspecified: Secondary | ICD-10-CM | POA: Diagnosis not present

## 2020-07-05 LAB — CMP (CANCER CENTER ONLY)
ALT: 21 U/L (ref 0–44)
AST: 23 U/L (ref 15–41)
Albumin: 3.4 g/dL — ABNORMAL LOW (ref 3.5–5.0)
Alkaline Phosphatase: 87 U/L (ref 38–126)
Anion gap: 7 (ref 5–15)
BUN: 26 mg/dL — ABNORMAL HIGH (ref 8–23)
CO2: 17 mmol/L — ABNORMAL LOW (ref 22–32)
Calcium: 8.5 mg/dL — ABNORMAL LOW (ref 8.9–10.3)
Chloride: 112 mmol/L — ABNORMAL HIGH (ref 98–111)
Creatinine: 1.76 mg/dL — ABNORMAL HIGH (ref 0.44–1.00)
GFR, Estimated: 29 mL/min — ABNORMAL LOW (ref >60)
Glucose, Bld: 153 mg/dL — ABNORMAL HIGH (ref 70–99)
Potassium: 4.4 mmol/L (ref 3.5–5.1)
Sodium: 136 mmol/L (ref 135–145)
Total Bilirubin: 0.3 mg/dL (ref 0.3–1.2)
Total Protein: 6.6 g/dL (ref 6.5–8.1)

## 2020-07-05 LAB — CBC WITH DIFFERENTIAL/PLATELET
Abs Immature Granulocytes: 0.15 10*3/uL — ABNORMAL HIGH (ref 0.00–0.07)
Basophils Absolute: 0.1 10*3/uL (ref 0.0–0.1)
Basophils Relative: 1 %
Eosinophils Absolute: 0.2 10*3/uL (ref 0.0–0.5)
Eosinophils Relative: 2 %
HCT: 33.5 % — ABNORMAL LOW (ref 36.0–46.0)
Hemoglobin: 10.4 g/dL — ABNORMAL LOW (ref 12.0–15.0)
Immature Granulocytes: 2 %
Lymphocytes Relative: 13 %
Lymphs Abs: 1.2 10*3/uL (ref 0.7–4.0)
MCH: 27 pg (ref 26.0–34.0)
MCHC: 31 g/dL (ref 30.0–36.0)
MCV: 87 fL (ref 80.0–100.0)
Monocytes Absolute: 0.8 10*3/uL (ref 0.1–1.0)
Monocytes Relative: 9 %
Neutro Abs: 7 10*3/uL (ref 1.7–7.7)
Neutrophils Relative %: 73 %
Platelets: 386 10*3/uL (ref 150–400)
RBC: 3.85 MIL/uL — ABNORMAL LOW (ref 3.87–5.11)
RDW: 15 % (ref 11.5–15.5)
WBC: 9.5 10*3/uL (ref 4.0–10.5)
nRBC: 0 % (ref 0.0–0.2)

## 2020-07-05 MED ORDER — SODIUM CHLORIDE 0.9 % IV SOLN
240.0000 mg | Freq: Once | INTRAVENOUS | Status: AC
  Administered 2020-07-05: 240 mg via INTRAVENOUS
  Filled 2020-07-05: qty 24

## 2020-07-05 MED ORDER — SODIUM CHLORIDE 0.9% FLUSH
10.0000 mL | INTRAVENOUS | Status: DC | PRN
  Administered 2020-07-05: 10 mL
  Filled 2020-07-05: qty 10

## 2020-07-05 MED ORDER — SODIUM CHLORIDE 0.9 % IV SOLN
INTRAVENOUS | Status: AC
  Filled 2020-07-05 (×2): qty 250

## 2020-07-05 MED ORDER — HEPARIN SOD (PORK) LOCK FLUSH 100 UNIT/ML IV SOLN
500.0000 [IU] | Freq: Once | INTRAVENOUS | Status: AC | PRN
  Administered 2020-07-05: 500 [IU]
  Filled 2020-07-05: qty 5

## 2020-07-05 MED ORDER — SODIUM CHLORIDE 0.9% FLUSH
10.0000 mL | Freq: Once | INTRAVENOUS | Status: AC
  Administered 2020-07-05: 10 mL
  Filled 2020-07-05: qty 10

## 2020-07-05 NOTE — Patient Instructions (Signed)

## 2020-07-05 NOTE — Progress Notes (Signed)
Verbal order/Dr.Kale - Ok to receive Nivolumab today with Creatinine 1.76

## 2020-07-05 NOTE — Patient Instructions (Signed)
[facility] Discharge Instructions for Patients Receiving Chemotherapy  Today you received the following chemotherapy agents: nivolumab  To help prevent nausea and vomiting after your treatment, we encourage you to take your nausea medication as directed.   If you develop nausea and vomiting that is not controlled by your nausea medication, call the clinic.   BELOW ARE SYMPTOMS THAT SHOULD BE REPORTED IMMEDIATELY:  *FEVER GREATER THAN 100.5 F  *CHILLS WITH OR WITHOUT FEVER  NAUSEA AND VOMITING THAT IS NOT CONTROLLED WITH YOUR NAUSEA MEDICATION  *UNUSUAL SHORTNESS OF BREATH  *UNUSUAL BRUISING OR BLEEDING  TENDERNESS IN MOUTH AND THROAT WITH OR WITHOUT PRESENCE OF ULCERS  *URINARY PROBLEMS  *BOWEL PROBLEMS  UNUSUAL RASH Items with * indicate a potential emergency and should be followed up as soon as possible.  Feel free to call the clinic should you have any questions or concerns. The clinic phone number is [phone].  Please show the [place] at check-in to the Emergency Department and triage nurse. Denosumab injection What is this medicine? DENOSUMAB (den oh sue mab) slows bone breakdown. Prolia is used to treat osteoporosis in women after menopause and in men, and in people who are taking corticosteroids for 6 months or more. Delton See is used to treat a high calcium level due to cancer and to prevent bone fractures and other bone problems caused by multiple myeloma or cancer bone metastases. Delton See is also used to treat giant cell tumor of the bone. This medicine may be used for other purposes; ask your health care provider or pharmacist if you have questions. COMMON BRAND NAME(S): Prolia, XGEVA What should I tell my health care provider before I take this medicine? They need to know if you have any of these conditions:  dental disease  having surgery or tooth extraction  infection  kidney disease  low levels of calcium or Vitamin D in the  blood  malnutrition  on hemodialysis  skin conditions or sensitivity  thyroid or parathyroid disease  an unusual reaction to denosumab, other medicines, foods, dyes, or preservatives  pregnant or trying to get pregnant  breast-feeding How should I use this medicine? This medicine is for injection under the skin. It is given by a health care professional in a hospital or clinic setting. A special MedGuide will be given to you before each treatment. Be sure to read this information carefully each time. For Prolia, talk to your pediatrician regarding the use of this medicine in children. Special care may be needed. For Delton See, talk to your pediatrician regarding the use of this medicine in children. While this drug may be prescribed for children as young as 13 years for selected conditions, precautions do apply. Overdosage: If you think you have taken too much of this medicine contact a poison control center or emergency room at once. NOTE: This medicine is only for you. Do not share this medicine with others. What if I miss a dose? It is important not to miss your dose. Call your doctor or health care professional if you are unable to keep an appointment. What may interact with this medicine? Do not take this medicine with any of the following medications:  other medicines containing denosumab This medicine may also interact with the following medications:  medicines that lower your chance of fighting infection  steroid medicines like prednisone or cortisone This list may not describe all possible interactions. Give your health care provider a list of all the medicines, herbs, non-prescription drugs,  or dietary supplements you use. Also tell them if you smoke, drink alcohol, or use illegal drugs. Some items may interact with your medicine. What should I watch for while using this medicine? Visit your doctor or health care professional for regular checks on your progress. Your doctor or  health care professional may order blood tests and other tests to see how you are doing. Call your doctor or health care professional for advice if you get a fever, chills or sore throat, or other symptoms of a cold or flu. Do not treat yourself. This drug may decrease your body's ability to fight infection. Try to avoid being around people who are sick. You should make sure you get enough calcium and vitamin D while you are taking this medicine, unless your doctor tells you not to. Discuss the foods you eat and the vitamins you take with your health care professional. See your dentist regularly. Brush and floss your teeth as directed. Before you have any dental work done, tell your dentist you are receiving this medicine. Do not become pregnant while taking this medicine or for 5 months after stopping it. Talk with your doctor or health care professional about your birth control options while taking this medicine. Women should inform their doctor if they wish to become pregnant or think they might be pregnant. There is a potential for serious side effects to an unborn child. Talk to your health care professional or pharmacist for more information. What side effects may I notice from receiving this medicine? Side effects that you should report to your doctor or health care professional as soon as possible:  allergic reactions like skin rash, itching or hives, swelling of the face, lips, or tongue  bone pain  breathing problems  dizziness  jaw pain, especially after dental work  redness, blistering, peeling of the skin  signs and symptoms of infection like fever or chills; cough; sore throat; pain or trouble passing urine  signs of low calcium like fast heartbeat, muscle cramps or muscle pain; pain, tingling, numbness in the hands or feet; seizures  unusual bleeding or bruising  unusually weak or tired Side effects that usually do not require medical attention (report to your doctor or  health care professional if they continue or are bothersome):  constipation  diarrhea  headache  joint pain  loss of appetite  muscle pain  runny nose  tiredness  upset stomach This list may not describe all possible side effects. Call your doctor for medical advice about side effects. You may report side effects to FDA at 1-800-FDA-1088. Where should I keep my medicine? This medicine is only given in a clinic, doctor's office, or other health care setting and will not be stored at home. NOTE: This sheet is a summary. It may not cover all possible information. If you have questions about this medicine, talk to your doctor, pharmacist, or health care provider.  2021 Elsevier/Gold Standard (2017-08-15 16:10:44)

## 2020-07-05 NOTE — Progress Notes (Signed)
Pt declined to finish remainder of IV fluids after her Nivolumab infusion.

## 2020-07-18 ENCOUNTER — Other Ambulatory Visit: Payer: Self-pay | Admitting: *Deleted

## 2020-07-18 ENCOUNTER — Other Ambulatory Visit: Payer: Self-pay | Admitting: Gastroenterology

## 2020-07-18 DIAGNOSIS — C642 Malignant neoplasm of left kidney, except renal pelvis: Secondary | ICD-10-CM

## 2020-07-19 ENCOUNTER — Inpatient Hospital Stay: Payer: Medicare Other

## 2020-07-19 ENCOUNTER — Other Ambulatory Visit: Payer: Self-pay

## 2020-07-19 ENCOUNTER — Other Ambulatory Visit: Payer: Self-pay | Admitting: Hematology

## 2020-07-19 ENCOUNTER — Other Ambulatory Visit: Payer: Medicare Other

## 2020-07-19 ENCOUNTER — Ambulatory Visit: Payer: Medicare Other

## 2020-07-19 VITALS — BP 132/63 | HR 64 | Temp 97.6°F | Resp 20 | Wt 126.8 lb

## 2020-07-19 DIAGNOSIS — C642 Malignant neoplasm of left kidney, except renal pelvis: Secondary | ICD-10-CM

## 2020-07-19 DIAGNOSIS — Z7189 Other specified counseling: Secondary | ICD-10-CM

## 2020-07-19 DIAGNOSIS — Z5112 Encounter for antineoplastic immunotherapy: Secondary | ICD-10-CM | POA: Diagnosis not present

## 2020-07-19 DIAGNOSIS — C7951 Secondary malignant neoplasm of bone: Secondary | ICD-10-CM

## 2020-07-19 LAB — CBC WITH DIFFERENTIAL (CANCER CENTER ONLY)
Abs Immature Granulocytes: 0.08 10*3/uL — ABNORMAL HIGH (ref 0.00–0.07)
Basophils Absolute: 0.1 10*3/uL (ref 0.0–0.1)
Basophils Relative: 1 %
Eosinophils Absolute: 0.4 10*3/uL (ref 0.0–0.5)
Eosinophils Relative: 4 %
HCT: 35.6 % — ABNORMAL LOW (ref 36.0–46.0)
Hemoglobin: 11.1 g/dL — ABNORMAL LOW (ref 12.0–15.0)
Immature Granulocytes: 1 %
Lymphocytes Relative: 21 %
Lymphs Abs: 1.9 10*3/uL (ref 0.7–4.0)
MCH: 26.9 pg (ref 26.0–34.0)
MCHC: 31.2 g/dL (ref 30.0–36.0)
MCV: 86.4 fL (ref 80.0–100.0)
Monocytes Absolute: 1.1 10*3/uL — ABNORMAL HIGH (ref 0.1–1.0)
Monocytes Relative: 12 %
Neutro Abs: 5.6 10*3/uL (ref 1.7–7.7)
Neutrophils Relative %: 61 %
Platelet Count: 305 10*3/uL (ref 150–400)
RBC: 4.12 MIL/uL (ref 3.87–5.11)
RDW: 15.2 % (ref 11.5–15.5)
WBC Count: 9.2 10*3/uL (ref 4.0–10.5)
nRBC: 0 % (ref 0.0–0.2)

## 2020-07-19 LAB — CMP (CANCER CENTER ONLY)
ALT: 73 U/L — ABNORMAL HIGH (ref 0–44)
AST: 29 U/L (ref 15–41)
Albumin: 3.5 g/dL (ref 3.5–5.0)
Alkaline Phosphatase: 115 U/L (ref 38–126)
Anion gap: 12 (ref 5–15)
BUN: 33 mg/dL — ABNORMAL HIGH (ref 8–23)
CO2: 14 mmol/L — ABNORMAL LOW (ref 22–32)
Calcium: 9.3 mg/dL (ref 8.9–10.3)
Chloride: 114 mmol/L — ABNORMAL HIGH (ref 98–111)
Creatinine: 1.91 mg/dL — ABNORMAL HIGH (ref 0.44–1.00)
GFR, Estimated: 27 mL/min — ABNORMAL LOW (ref >60)
Glucose, Bld: 117 mg/dL — ABNORMAL HIGH (ref 70–99)
Potassium: 4 mmol/L (ref 3.5–5.1)
Sodium: 140 mmol/L (ref 135–145)
Total Bilirubin: 0.4 mg/dL (ref 0.3–1.2)
Total Protein: 6.7 g/dL (ref 6.5–8.1)

## 2020-07-19 MED ORDER — SODIUM CHLORIDE 0.9 % IV SOLN
INTRAVENOUS | Status: AC
  Filled 2020-07-19 (×2): qty 250

## 2020-07-19 MED ORDER — SODIUM CHLORIDE 0.9 % IV SOLN
240.0000 mg | Freq: Once | INTRAVENOUS | Status: AC
  Administered 2020-07-19: 240 mg via INTRAVENOUS
  Filled 2020-07-19: qty 24

## 2020-07-19 MED ORDER — SODIUM CHLORIDE 0.9% FLUSH
10.0000 mL | INTRAVENOUS | Status: DC | PRN
  Administered 2020-07-19: 10 mL
  Filled 2020-07-19: qty 10

## 2020-07-19 MED ORDER — HEPARIN SOD (PORK) LOCK FLUSH 100 UNIT/ML IV SOLN
500.0000 [IU] | Freq: Once | INTRAVENOUS | Status: AC | PRN
  Administered 2020-07-19: 500 [IU]
  Filled 2020-07-19: qty 5

## 2020-07-19 NOTE — Progress Notes (Signed)
Patient refused remainder of IV fluids today.

## 2020-07-19 NOTE — Progress Notes (Signed)
Per Dr. Kale OK to treat with today's labs  

## 2020-07-19 NOTE — Patient Instructions (Signed)
[facility] Discharge Instructions for Patients Receiving Chemotherapy  Today you received the following chemotherapy agents Opdivo  To help prevent nausea and vomiting after your treatment, we encourage you to take your nausea medication as directed   If you develop nausea and vomiting that is not controlled by your nausea medication, call the clinic.   BELOW ARE SYMPTOMS THAT SHOULD BE REPORTED IMMEDIATELY:  *FEVER GREATER THAN 100.5 F  *CHILLS WITH OR WITHOUT FEVER  NAUSEA AND VOMITING THAT IS NOT CONTROLLED WITH YOUR NAUSEA MEDICATION  *UNUSUAL SHORTNESS OF BREATH  *UNUSUAL BRUISING OR BLEEDING  TENDERNESS IN MOUTH AND THROAT WITH OR WITHOUT PRESENCE OF ULCERS  *URINARY PROBLEMS  *BOWEL PROBLEMS  UNUSUAL RASH Items with * indicate a potential emergency and should be followed up as soon as possible.  Feel free to call the clinic should you have any questions or concerns. The clinic phone number is [phone].  Please show the CHEMO ALERT CARD at check-in to the Emergency Department and triage nurse.   

## 2020-07-31 ENCOUNTER — Other Ambulatory Visit: Payer: Self-pay

## 2020-07-31 DIAGNOSIS — C642 Malignant neoplasm of left kidney, except renal pelvis: Secondary | ICD-10-CM

## 2020-07-31 NOTE — Progress Notes (Signed)
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 She is here for next cycle of Nivolumab. The patient's last visit with [name] was on 06/21/2020. The pt reports that she is doing well overall.  The pt reports no new concerns or symptoms. She notes no issues with her recent hernia surgery. The pt notes she currently has a sore on her foot and broken pinky toe, but is seeing a podiatrist for this. The pt notes that she is seeing Dr. Robina Ade for Endocrinology.  Lab results today 08/01/2020 of CBC w/diff and CMP is as follows: all values are WNL except for RBC of 3.80, Hgb of 10.3, HCT of 33.0, Chloride of 112, CO2 of 17, Glucose of 138, Creatinine of 1.63, Calcium of 7.8, Total Protein of 6.2, Albumin of 3.4, GFR est of 32.  On review of systems, pt reports broken toe and denies abdominal pain, leg swelling and any other symptoms.  MEDICAL HISTORY:   Past Medical History:  Diagnosis Date  . Anemia   . Arthritis    lower back, hips, hands  . Biliary stricture   . Diabetes mellitus ([place])    type 2   . Early cataracts, bilateral    Md just watching  . Elevated liver enzymes   . Family history of adverse reaction to anesthesia    Daughter hard to wake up  . Gallstones   . GERD (gastroesophageal reflux disease)    occasional - diet controlled  . History of blood transfusion 2018  . History of hiatal hernia   . HTN (hypertension)   . Hyperlipidemia   . Hypothyroidism   . left renal ca dx'd 2018   renal cancer - left kidney removed, pill chemo x  1 yr  . Myocardial infarction ([place]) 1991   no deficits  . SVD (spontaneous vaginal delivery)    x 3  . Wears glasses    Dyslipidemia Osteoarthritis Ex-smoker Coronary artery disease Thyroid disorder-was apparently on levothyroxine 25 g daily which has subsequently been discontinued . Mitral regurgitation B12 deficiency  hiatal hernia with esophagitis   SURGICAL HISTORY: Past Surgical History:  Procedure Laterality Date  . BALLOON DILATION N/A 07/23/2018   Procedure: BALLOON DILATION;  Surgeon: Rush Landmark Telford Nab., MD;  Location: [place];  Service: Gastroenterology;  Laterality: N/A;  . BILIARY BRUSHING  08/10/2018   Procedure: BILIARY BRUSHING;  Surgeon: Rush Landmark Telford Nab., MD;  Location: [place];  Service: Gastroenterology;;  . BILIARY BRUSHING  11/30/2018   Procedure: BILIARY BRUSHING;  Surgeon: Irving Copas., MD;  Location: [place];  Service: Gastroenterology;;  . BILIARY DILATION  08/10/2018   Procedure: BILIARY DILATION;  Surgeon: Irving Copas., MD;  Location: [place];  Service: Gastroenterology;;  . BILIARY DILATION  08/27/2018   Procedure: BILIARY DILATION;  Surgeon: Irving Copas., MD;  Location: [place];  Service: Gastroenterology;;  . BILIARY DILATION  01/24/2020   Procedure: BILIARY DILATION;  Surgeon: Irving Copas., MD;  Location: [place];  Service: Gastroenterology;;  . BILIARY STENT PLACEMENT  08/10/2018  Procedure: BILIARY STENT PLACEMENT;  Surgeon: Rush Landmark Telford Nab., MD;  Location: [place];  Service: Gastroenterology;;  . BILIARY STENT PLACEMENT  08/27/2018   Procedure: BILIARY STENT PLACEMENT;  Surgeon: Irving Copas., MD;  Location: [place];  Service: Gastroenterology;;  . BILIARY STENT PLACEMENT  11/30/2018   Procedure: BILIARY STENT PLACEMENT;  Surgeon: Irving Copas., MD;  Location: [place];  Service: Gastroenterology;;  . BIOPSY  07/23/2018   Procedure:  BIOPSY;  Surgeon: Irving Copas., MD;  Location: [place];  Service: Gastroenterology;;  . BIOPSY  01/24/2020   Procedure: BIOPSY;  Surgeon: Irving Copas., MD;  Location: [place];  Service: Gastroenterology;;  . CHOLECYSTECTOMY N/A 09/02/2018   Procedure: LAPAROSCOPIC CHOLECYSTECTOMY;  Surgeon: Stark Klein, MD;  Location: [place];  Service: General;  Laterality: N/A;  . COLONOSCOPY     normal   . ENDOSCOPIC RETROGRADE CHOLANGIOPANCREATOGRAPHY (ERCP) WITH PROPOFOL N/A 08/10/2018   Procedure: ENDOSCOPIC RETROGRADE CHOLANGIOPANCREATOGRAPHY (ERCP) WITH PROPOFOL;  Surgeon: Irving Copas., MD;  Location: [place];  Service: Gastroenterology;  Laterality: N/A;  . ENDOSCOPIC RETROGRADE CHOLANGIOPANCREATOGRAPHY (ERCP) WITH PROPOFOL N/A 11/30/2018   Procedure: ENDOSCOPIC RETROGRADE CHOLANGIOPANCREATOGRAPHY (ERCP) WITH PROPOFOL;  Surgeon: Rush Landmark Telford Nab., MD;  Location: [place];  Service: Gastroenterology;  Laterality: N/A;  . ENDOSCOPIC RETROGRADE CHOLANGIOPANCREATOGRAPHY (ERCP) WITH PROPOFOL N/A 01/24/2020   Procedure: ENDOSCOPIC RETROGRADE CHOLANGIOPANCREATOGRAPHY (ERCP) WITH PROPOFOL;  Surgeon: Rush Landmark Telford Nab., MD;  Location: [place];  Service: Gastroenterology;  Laterality: N/A;  . ERCP N/A 07/23/2018   Procedure: ENDOSCOPIC RETROGRADE CHOLANGIOPANCREATOGRAPHY (ERCP);  Surgeon: Irving Copas., MD;  Location: [place];  Service: Gastroenterology;  Laterality: N/A;  . ERCP N/A 08/27/2018   Procedure: ENDOSCOPIC RETROGRADE CHOLANGIOPANCREATOGRAPHY (ERCP);  Surgeon: Irving Copas., MD;  Location: [place];  Service: Gastroenterology;  Laterality: N/A;  . ESOPHAGOGASTRODUODENOSCOPY N/A 01/24/2020   Procedure: ESOPHAGOGASTRODUODENOSCOPY (EGD);  Surgeon: Irving Copas., MD;  Location: [place];  Service: Gastroenterology;  Laterality: N/A;  . ESOPHAGOGASTRODUODENOSCOPY (EGD) WITH PROPOFOL N/A 08/27/2018   Procedure:  ESOPHAGOGASTRODUODENOSCOPY (EGD) WITH PROPOFOL;  Surgeon: Rush Landmark Telford Nab., MD;  Location: [place];  Service: Gastroenterology;  Laterality: N/A;  . EUS N/A 08/27/2018   Procedure: ESOPHAGEAL ENDOSCOPIC ULTRASOUND (EUS) RADIAL;  Surgeon: Rush Landmark Telford Nab., MD;  Location: [place];  Service: Gastroenterology;  Laterality: N/A;  . FINE NEEDLE ASPIRATION  08/27/2018   Procedure: FINE NEEDLE ASPIRATION (FNA) LINEAR;  Surgeon: Irving Copas., MD;  Location: [place];  Service: Gastroenterology;;  . Fatima Blank HERNIA REPAIR N/A 06/23/2020   Procedure: OPEN INCISIONAL HERNIA REPAIR WITH MESH;  Surgeon: Felicie Morn, MD;  Location: [facility];  Service: General;  Laterality: N/A;  ROOM 2 STARTING AT 11:00AM FOR 90 MIN  . IR IMAGING GUIDED PORT INSERTION  01/08/2018  . LAPAROSCOPIC NEPHRECTOMY Left 08/01/2016   Procedure: LAPAROSCOPIC  RADICAL NEPHRECTOMY/ REPAIR OF UMBILICAL HERNIA;  Surgeon: Raynelle Bring, MD;  Location: [facility];  Service: Urology;  Laterality: Left;  . REMOVAL OF STONES  07/23/2018   Procedure: REMOVAL OF GALL STONES;  Surgeon: Rush Landmark Telford Nab., MD;  Location: [place];  Service: Gastroenterology;;  . REMOVAL OF STONES  08/10/2018   Procedure: REMOVAL OF STONES;  Surgeon: Irving Copas., MD;  Location: [place];  Service: Gastroenterology;;  . REMOVAL OF STONES  08/27/2018   Procedure: REMOVAL OF STONES;  Surgeon: Irving Copas., MD;  Location: [place];  Service: Gastroenterology;;  . REMOVAL OF STONES  01/24/2020   Procedure: REMOVAL OF STONES;  Surgeon:  Mansouraty, Telford Nab., MD;  Location: [place];  Service: Gastroenterology;;  . [name]  07/23/2018   Procedure: [name];  Surgeon: Irving Copas., MD;  Location: [place];  Service: Gastroenterology;;  . Lavell Islam REMOVAL  08/27/2018   Procedure: STENT REMOVAL;  Surgeon: Irving Copas., MD;  Location: [place];  Service:  Gastroenterology;;  . Lavell Islam REMOVAL  11/30/2018   Procedure: STENT REMOVAL;  Surgeon: Irving Copas., MD;  Location: [place];  Service: Gastroenterology;;  . Lavell Islam REMOVAL  01/24/2020   Procedure: STENT REMOVAL;  Surgeon: Irving Copas., MD;  Location: [place];  Service: Gastroenterology;;  . UPPER GI ENDOSCOPY     x 1  . VAGINAL PROLAPSE REPAIR  11/18/2019   [facility]   No reported past surgeries EGD 05/01/2016 Dr. Earley Brooke Colonoscopy 03/2016 Dr. Earley Brooke  SOCIAL HISTORY: Social History   Socioeconomic History  . Marital status: Married    Spouse name: Not on file  . Number of children: 3  . Years of education: Not on file  . Highest education level: Not on file  Occupational History  . Occupation: retired  Tobacco Use  . Smoking status: Former Smoker    Packs/day: 1.00    Years: 8.00    Pack years: 8.00    Types: Cigarettes    Quit date: 06/18/1964    Years since quitting: 56.1  . Smokeless tobacco: Never Used  Vaping Use  . Vaping Use: Never used  Substance and Sexual Activity  . Alcohol use: No  . Drug use: No  . Sexual activity: Not Currently    Birth control/protection: Post-menopausal  Other Topics Concern  . Not on file  Social History Narrative   Married.  Three children   Social Determinants of Health   Financial Resource Strain: Not on file  Food Insecurity: Not on file  Transportation Needs: Not on file  Physical Activity: Not on file  Stress: Not on file  Social Connections: Not on file  Intimate Partner Violence: Not on file   Patient lives in [place] Works as a Solicitor part-time Ex-smoker quit long time ago In 1961 . Smoked 2 packs per day for 8 years prior to that .  or a lot of stress since her daughter is also getting treated for stage IV uterine cancer.  FAMILY HISTORY:  Mother deceased Father with asthma and emphysema died at 22 years with the MI. Brother deceased of heart  disease   ALLERGIES:  is allergic to doxycycline, adhesive [tape], nsaids, and statins.  MEDICATIONS:  Current Outpatient Medications  Medication Sig Dispense Refill  . amLODipine (NORVASC) 5 MG tablet Take 5 mg by mouth daily.   6  . atorvastatin (LIPITOR) 80 MG tablet Take 80 mg by mouth at bedtime.     . carvedilol (COREG) 6.25 MG tablet Take 6.25 mg by mouth 2 (two) times daily.  6  . clobetasol ointment (TEMOVATE) 7.51 % Apply 1 application topically 2 (two) times daily as needed (lichen sclerosis).     . dapagliflozin propanediol (FARXIGA) 10 MG TABS tablet     . Denosumab (XGEVA [place]) Inject into the skin every 6 (six) weeks.    . diphenhydrAMINE (BENADRYL) 25 MG tablet Take 25 mg by mouth at bedtime.    Marland Kitchen glimepiride (AMARYL) 1 MG tablet Take 1 mg by mouth daily with breakfast.    . hydrocortisone (CORTEF) 10 MG tablet Take 5-10 mg by mouth See admin instructions. Take 1 tablet (10 mg) by  mouth in the morning & 1/2 tablet (5 mg) by mouth in the evening.    Marland Kitchen levothyroxine (SYNTHROID) 75 MCG tablet Take 75 mcg by mouth daily before breakfast.     . Lidocaine 4 % PTCH Apply 1 patch topically daily as needed (pain).    Marland Kitchen lidocaine-prilocaine (EMLA) cream Apply 1 application topically daily as needed (prior to port access). 30 g 1  . Nivolumab (OPDIVO IV) Inject 1 Dose into the vein every 14 (fourteen) days. [place]    . omeprazole (PRILOSEC) 20 MG capsule TAKE 1 CAPSULE BY MOUTH TWICE DAILY BEFORE A MEAL(S) 60 capsule 3  . SSD 1 % cream Apply topically 2 (two) times daily.    Marland Kitchen oxyCODONE (OXY IR/ROXICODONE) 5 MG immediate release tablet Take 1 tablet (5 mg total) by mouth every 4 (four) hours as needed for severe pain. (Patient not taking: Reported on 07/05/2020) 30 tablet 0  . sitaGLIPtin (JANUVIA) 50 MG tablet Take 50 mg by mouth daily.     No current facility-administered medications for this visit.   Facility-Administered Medications Ordered in Other Visits  Medication  Dose Route Frequency Provider Last Rate Last Admin  . sodium chloride flush (NS) 0.9 % injection 10 mL  10 mL Intracatheter PRN Truitt Merle, MD   10 mL at 11/20/18 1232    REVIEW OF SYSTEMS:   10 Point review of Systems was done is negative except as noted above.  PHYSICAL EXAMINATION:  ECOG FS:2 - Symptomatic, <50% confined to bed  Vitals:   08/01/20 0913  BP: (!) 127/52  Pulse: (!) 57  Resp: 16  Temp: 97.9 F (36.6 C)  SpO2: 100%   Wt Readings from Last 3 Encounters:  08/01/20 128 lb 9.6 oz (58.3 kg)  07/19/20 126 lb 12.8 oz (57.5 kg)  07/05/20 131 lb 12 oz (59.8 kg)   Body mass index is 25.97 kg/m.    NAD. GENERAL:alert, in no acute distress and comfortable SKIN: no acute rashes, no significant lesions EYES: conjunctiva are pink and non-injected, sclera anicteric OROPHARYNX: MMM, no exudates, no oropharyngeal erythema or ulceration NECK: supple, no JVD LYMPH:  no palpable lymphadenopathy in the cervical, axillary or inguinal regions LUNGS: clear to auscultation b/l with normal respiratory effort HEART: regular rate & rhythm ABDOMEN:  normoactive bowel sounds , non tender, not distended. Extremity: no pedal edema PSYCH: alert & oriented x 3 with fluent speech NEURO: no focal motor/sensory deficits  LABORATORY DATA:  I have reviewed the data as listed  . CBC Latest Ref Rng & Units 08/01/2020 07/19/2020 07/05/2020  WBC 4.0 - 10.5 K/uL 8.4 9.2 9.5  Hemoglobin 12.0 - 15.0 g/dL 10.3(L) 11.1(L) 10.4(L)  Hematocrit 36.0 - 46.0 % 33.0(L) 35.6(L) 33.5(L)  Platelets 150 - 400 K/uL 248 305 386    CBC    Component Value Date/Time   WBC 8.4 08/01/2020 0856   WBC 9.5 07/05/2020 0921   RBC 3.80 (L) 08/01/2020 0856   HGB 10.3 (L) 08/01/2020 0856   HGB 11.2 (L) 04/04/2017 0830   HCT 33.0 (L) 08/01/2020 0856   HCT 35.2 04/04/2017 0830   PLT 248 08/01/2020 0856   PLT 250 04/04/2017 0830   MCV 86.8 08/01/2020 0856   MCV 107.6 (H) 04/04/2017 0830   MCH 27.1 08/01/2020 0856    MCHC 31.2 08/01/2020 0856   RDW 15.5 08/01/2020 0856   RDW 14.0 04/04/2017 0830   LYMPHSABS 1.2 08/01/2020 0856   LYMPHSABS 1.6 04/04/2017 0830   MONOABS 0.8  08/01/2020 0856   MONOABS 0.4 04/04/2017 0830   EOSABS 0.1 08/01/2020 0856   EOSABS 0.1 04/04/2017 0830   BASOSABS 0.1 08/01/2020 0856   BASOSABS 0.0 04/04/2017 0830     . CMP Latest Ref Rng & Units 08/01/2020 07/19/2020 07/05/2020  Glucose 70 - 99 mg/dL 138(H) 117(H) 153(H)  BUN 8 - 23 mg/dL 22 33(H) 26(H)  Creatinine 0.44 - 1.00 mg/dL 1.63(H) 1.91(H) 1.76(H)  Sodium 135 - 145 mmol/L 140 140 136  Potassium 3.5 - 5.1 mmol/L 3.9 4.0 4.4  Chloride 98 - 111 mmol/L 112(H) 114(H) 112(H)  CO2 22 - 32 mmol/L 17(L) 14(L) 17(L)  Calcium 8.9 - 10.3 mg/dL 7.8(L) 9.3 8.5(L)  Total Protein 6.5 - 8.1 g/dL 6.2(L) 6.7 6.6  Total Bilirubin 0.3 - 1.2 mg/dL 0.4 0.4 0.3  Alkaline Phos 38 - 126 U/L 78 115 87  AST 15 - 41 U/L 29 29 23   ALT 0 - 44 U/L 27 73(H) 21   B12 level 299 (OSH)-->455  . Lab Results  Component Value Date   LDH 117 12/22/2019        08/27/18 Surgical Pathology from Cholecystectomy:         RADIOGRAPHIC STUDIES: I have personally reviewed the radiological images as listed and agreed with the findings in the report. Nm Pet Image Restag (ps) Skull Base To Thigh  Result Date: 01/03/2017 IMPRESSION: 1. There is a new lesion in the hepatic dome which is FDG avid and low in attenuation on CT imaging consistent with metastatic disease. Another region of uptake in the left hepatic lobe posteriorly demonstrates no CT correlate. A second subtle metastasis is not excluded on today's study. An MRI could better assess for other hepatic metastases if clinically warranted. 2. New metastatic lesion in the left side of T8. 3. New pulmonary nodule in the right upper lobe. This is too small to characterize but suspicious. Recommend attention on follow-up. No FDG avid nodules or other enlarging nodules. 4. The uptake at the  previous left renal artery has almost resolved in the interval and is favored to be post therapeutic. Recommend attention on follow-up. 5. The metastasis in the posterior right hilum seen previously has resolved. Electronically Signed   By: Dorise Bullion III M.D   On: 01/03/2017 11:16   .No results found.  ASSESSMENT & PLAN:    80 y.o. 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 PET/CT 06/19/2017 -- consistent with partial metabolic response to treatment.  Rt adrenal gland bx - showed clear cell RCC  S/p CYtoreductive left radical nephrectomy and left adrenal gland resection on 08/01/2016 by Dr Alinda Money.  10/16/17 PET/CT revealed Continued improvement, with the T7 metastatic lesion no longer significantly hypermetabolic. The previous right lower lobe pulmonary nodule is even less apparent, perhaps about 2 mm in diameter today; given that this measured 6 mm on 03/28/2017 this probably represents an effectively treated metastatic lesion. Currently no appreciable hypermetabolic activity is identified to suggest active malignancy. Distended gallbladder with gallbladder wall thickening and gallstones. Correlate clinically in assessing for cholecystitis. Small but abnormal amount of free pelvic fluid, nonspecific. Other imaging findings of potential clinical significance: Chronic ethmoid sinusitis. Aortic Atherosclerosis. Stable 5 mm right middle lobe pulmonary nodule, not hypermetabolic but below sensitive PET-CT size thresholds. Left nephrectomy. Notable pelvic floor laxity with cystocele. Chronic bilateral Sacroiliitis.  04/03/18 PET/CT revealed No evidence for new or  progressive hypermetabolic disease on today's study to suggest metastatic progression. 2. Stable appearance of the T7 metastatic lesion without Hypermetabolism. 3. Tiny focus of FDG accumulation identified along  the skin of the low right inguinal fold. No associated lesion evident on CT. This may be related to urinary contaminant. 4. Cholelithiasis with similar appearance of diffuse gallbladder wall thickening. 5. Stable 5 mm right middle lobe pulmonary nodule. 6.  Aortic Atherosclerois. 7. Diffuse colonic diverticulosis.   09/18/18 CT A/P revealed "Common duct stent in place. Improvement to resolution in biliary duct dilatation compared to 08/29/2018. Interval cholecystectomy without acute complication. 2. Left nephrectomy, without evidence of metastatic disease. 3.  Tiny hiatal hernia. 4. Coronary artery atherosclerosis. Aortic Atherosclerosis. 5. Mild limitations secondary to lack of IV contrast. 6. Marked pelvic floor laxity with cystocele and rectal prolapse".  07/02/2019 PET/CT ([id number]) which revealed "1. 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  #3 Small pulmonary lesions -- 10/16/17 PET/CT showed pulmonary less apparent than 03/28/17 PET/CT, decreasing from 28mm to 32mm diameter.   MRI brain shows no evidence of metastatic disease  #4 T7/8 Bone metastases - received Xgeva every 4 weeks from May 2018 to June 2019. 10/16/17 PET/CT revealed improvement with T7 metastatic lesion no longer significantly hypermetabolic.  -on Marchelle Folks  #5 ?liver mets- rpt PET/CT from 10/16/2017 shows no overt evidence of metastatic disease in the liver.  #6 Grade 1 Nausea - improved and intermittent. Hasnt used her anti-emetic as instructed so some nausea and decreased po intake.  #7 Grade 1 Diarrhea - resolved  #8 Hyponatremia -  resolved with sodium at 136 - likely related to some element of adrenal insufficiency, diarrhea, limited by mouth intake. Primarily solute free fluid intake.   #9 Acute on chronic renal insufficiency Creatine stable 1.44  #10 Hyperkalemia due to ACEI + RF- resolved  #11  Hemorrhoids -chronic with some bleeding --Recommended Sitz bath and OTC Anusol or Nupercaine for her hemorrhoid relief. -f/u with PCP for continued mx   #12 Moderate protein calorie malnutrition Weight has stabilized and improved Wt Readings from Last 3 Encounters:  08/01/20 128 lb 9.6 oz (58.3 kg)  07/19/20 126 lb 12.8 oz (57.5 kg)  07/05/20 131 lb 12 oz (59.8 kg)   Plan: Continue healthy po intake/diabetic diet -Previously recommended the patient to drink atleast 48-64 oz of fluids daily -f/u with PCP/Cardiology for diuretics management  #13 Hypothyroidism/Adrenal insufficiency/Diabetes -Continue being followed by Dr. Buddy Duty  #14 HTN - ?control. Patient tends to be anxious and has higher blood pressures in the clinic. She can have increased blood pressure from [place] as well. Plan: -continued close f/u with her PCP /cardiology regarding the many elements necessary for her care that are not directly related to her oncology care. -ACEI held due to AKI and hyperkalemia - following with cardiology to mx this. Has been started on Amlodipine instead.  #15 Grade 1 mucositis- resolved -Advised the patient to continue use of Magic Mouthwash to aid with nutrition  #16 CAD - positive cardiac nuclear stress test Following with cardiology and nephrology   #17 h/o Choledocholithiasis and cholelithiasis 07/10/18 US Abdomen revealed Biliary duct dilatation with 8 mm calculus at the level of the ampulla in the distal common bile duct. 2. Cholelithiasis. No gallbladder wall thickening or pericholecystic fluid. 3. Appearance of the liver raises concern for underlying hepatic cirrhosis. No focal liver lesions are demonstrable. 4.  Left kidney absent. 5.  Small cysts in right kidney. 6.  Aortic Atherosclerosis.  S/p ERCP on 07/23/18 with Dr. Valarie Merino Mansouraty  09/02/18 Cytopathology from ERCP was suspicious for malignant cells in bile duct, but was not definitive, other two findings were  benign.  PLAN:   -Discussed labwork today, 08/01/2020; blood counts and chemistries stable. -Advised pt we will get repeat scans prior to next visit as it has been nearly six months since last imaging. 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 -No prohibitive toxicities from Nivolumab. We will continue current treatment plan with Nivolumab with same premedications.  -Continue Xgeva every 6 weeks.  -PET in 4 weeks. -Will see back in 6 weeks with labs.   FOLLOW UP: Please schedule next 3 cycles of nivolumab with port flush and labs. MD visit in 6 weeks PET CT scan in 4 weeks   The total time spent in the appointment was 20 minutes and more than 50% was on counseling and direct patient cares.  All of the patient's questions were answered with apparent satisfaction. The patient knows to call the clinic with any problems, questions or concerns.   Sullivan Lone MD [place] AAHIVMS [facility] Hematology/Oncology Physician [facility]  (Office):       [phone] (Work cell):  [phone] (Fax):           [phone]  I, Reinaldo Raddle, am acting as scribe for Dr. Sullivan Lone, MD.    .I have reviewed the above documentation for accuracy and completeness, and I agree with the above. Brunetta Genera MD

## 2020-08-01 ENCOUNTER — Other Ambulatory Visit: Payer: Self-pay

## 2020-08-01 ENCOUNTER — Inpatient Hospital Stay (HOSPITAL_BASED_OUTPATIENT_CLINIC_OR_DEPARTMENT_OTHER): Payer: Medicare Other | Admitting: Hematology

## 2020-08-01 ENCOUNTER — Inpatient Hospital Stay: Payer: Medicare Other

## 2020-08-01 ENCOUNTER — Inpatient Hospital Stay: Payer: Medicare Other | Attending: Hematology

## 2020-08-01 VITALS — BP 127/52 | HR 57 | Temp 97.9°F | Resp 16 | Ht 59.0 in | Wt 128.6 lb

## 2020-08-01 DIAGNOSIS — E119 Type 2 diabetes mellitus without complications: Secondary | ICD-10-CM | POA: Diagnosis not present

## 2020-08-01 DIAGNOSIS — E039 Hypothyroidism, unspecified: Secondary | ICD-10-CM | POA: Diagnosis not present

## 2020-08-01 DIAGNOSIS — I10 Essential (primary) hypertension: Secondary | ICD-10-CM | POA: Diagnosis not present

## 2020-08-01 DIAGNOSIS — I2510 Atherosclerotic heart disease of native coronary artery without angina pectoris: Secondary | ICD-10-CM | POA: Diagnosis not present

## 2020-08-01 DIAGNOSIS — N2889 Other specified disorders of kidney and ureter: Secondary | ICD-10-CM | POA: Insufficient documentation

## 2020-08-01 DIAGNOSIS — C7951 Secondary malignant neoplasm of bone: Secondary | ICD-10-CM

## 2020-08-01 DIAGNOSIS — Z5112 Encounter for antineoplastic immunotherapy: Secondary | ICD-10-CM | POA: Insufficient documentation

## 2020-08-01 DIAGNOSIS — E875 Hyperkalemia: Secondary | ICD-10-CM | POA: Insufficient documentation

## 2020-08-01 DIAGNOSIS — Z7189 Other specified counseling: Secondary | ICD-10-CM

## 2020-08-01 DIAGNOSIS — E871 Hypo-osmolality and hyponatremia: Secondary | ICD-10-CM | POA: Insufficient documentation

## 2020-08-01 DIAGNOSIS — C642 Malignant neoplasm of left kidney, except renal pelvis: Secondary | ICD-10-CM

## 2020-08-01 DIAGNOSIS — C7802 Secondary malignant neoplasm of left lung: Secondary | ICD-10-CM | POA: Insufficient documentation

## 2020-08-01 DIAGNOSIS — M129 Arthropathy, unspecified: Secondary | ICD-10-CM | POA: Insufficient documentation

## 2020-08-01 DIAGNOSIS — C7971 Secondary malignant neoplasm of right adrenal gland: Secondary | ICD-10-CM | POA: Insufficient documentation

## 2020-08-01 DIAGNOSIS — E440 Moderate protein-calorie malnutrition: Secondary | ICD-10-CM | POA: Insufficient documentation

## 2020-08-01 DIAGNOSIS — E785 Hyperlipidemia, unspecified: Secondary | ICD-10-CM | POA: Diagnosis not present

## 2020-08-01 DIAGNOSIS — Z95828 Presence of other vascular implants and grafts: Secondary | ICD-10-CM

## 2020-08-01 DIAGNOSIS — C7972 Secondary malignant neoplasm of left adrenal gland: Secondary | ICD-10-CM | POA: Insufficient documentation

## 2020-08-01 DIAGNOSIS — K649 Unspecified hemorrhoids: Secondary | ICD-10-CM | POA: Insufficient documentation

## 2020-08-01 DIAGNOSIS — I700 Atherosclerosis of aorta: Secondary | ICD-10-CM | POA: Diagnosis not present

## 2020-08-01 DIAGNOSIS — R948 Abnormal results of function studies of other organs and systems: Secondary | ICD-10-CM | POA: Diagnosis not present

## 2020-08-01 DIAGNOSIS — I252 Old myocardial infarction: Secondary | ICD-10-CM | POA: Insufficient documentation

## 2020-08-01 DIAGNOSIS — Z79899 Other long term (current) drug therapy: Secondary | ICD-10-CM | POA: Insufficient documentation

## 2020-08-01 DIAGNOSIS — E538 Deficiency of other specified B group vitamins: Secondary | ICD-10-CM | POA: Insufficient documentation

## 2020-08-01 DIAGNOSIS — K219 Gastro-esophageal reflux disease without esophagitis: Secondary | ICD-10-CM | POA: Diagnosis not present

## 2020-08-01 DIAGNOSIS — R110 Nausea: Secondary | ICD-10-CM | POA: Insufficient documentation

## 2020-08-01 DIAGNOSIS — C7801 Secondary malignant neoplasm of right lung: Secondary | ICD-10-CM | POA: Insufficient documentation

## 2020-08-01 DIAGNOSIS — Z87891 Personal history of nicotine dependence: Secondary | ICD-10-CM | POA: Diagnosis not present

## 2020-08-01 LAB — CBC WITH DIFFERENTIAL (CANCER CENTER ONLY)
Abs Immature Granulocytes: 0.06 10*3/uL (ref 0.00–0.07)
Basophils Absolute: 0.1 10*3/uL (ref 0.0–0.1)
Basophils Relative: 1 %
Eosinophils Absolute: 0.1 10*3/uL (ref 0.0–0.5)
Eosinophils Relative: 2 %
HCT: 33 % — ABNORMAL LOW (ref 36.0–46.0)
Hemoglobin: 10.3 g/dL — ABNORMAL LOW (ref 12.0–15.0)
Immature Granulocytes: 1 %
Lymphocytes Relative: 15 %
Lymphs Abs: 1.2 10*3/uL (ref 0.7–4.0)
MCH: 27.1 pg (ref 26.0–34.0)
MCHC: 31.2 g/dL (ref 30.0–36.0)
MCV: 86.8 fL (ref 80.0–100.0)
Monocytes Absolute: 0.8 10*3/uL (ref 0.1–1.0)
Monocytes Relative: 9 %
Neutro Abs: 6.1 10*3/uL (ref 1.7–7.7)
Neutrophils Relative %: 72 %
Platelet Count: 248 10*3/uL (ref 150–400)
RBC: 3.8 MIL/uL — ABNORMAL LOW (ref 3.87–5.11)
RDW: 15.5 % (ref 11.5–15.5)
WBC Count: 8.4 10*3/uL (ref 4.0–10.5)
nRBC: 0 % (ref 0.0–0.2)

## 2020-08-01 LAB — CMP (CANCER CENTER ONLY)
ALT: 27 U/L (ref 0–44)
AST: 29 U/L (ref 15–41)
Albumin: 3.4 g/dL — ABNORMAL LOW (ref 3.5–5.0)
Alkaline Phosphatase: 78 U/L (ref 38–126)
Anion gap: 11 (ref 5–15)
BUN: 22 mg/dL (ref 8–23)
CO2: 17 mmol/L — ABNORMAL LOW (ref 22–32)
Calcium: 7.8 mg/dL — ABNORMAL LOW (ref 8.9–10.3)
Chloride: 112 mmol/L — ABNORMAL HIGH (ref 98–111)
Creatinine: 1.63 mg/dL — ABNORMAL HIGH (ref 0.44–1.00)
GFR, Estimated: 32 mL/min — ABNORMAL LOW (ref >60)
Glucose, Bld: 138 mg/dL — ABNORMAL HIGH (ref 70–99)
Potassium: 3.9 mmol/L (ref 3.5–5.1)
Sodium: 140 mmol/L (ref 135–145)
Total Bilirubin: 0.4 mg/dL (ref 0.3–1.2)
Total Protein: 6.2 g/dL — ABNORMAL LOW (ref 6.5–8.1)

## 2020-08-01 MED ORDER — SODIUM CHLORIDE 0.9% FLUSH
10.0000 mL | INTRAVENOUS | Status: DC | PRN
  Administered 2020-08-01: 10 mL
  Filled 2020-08-01: qty 10

## 2020-08-01 MED ORDER — SODIUM CHLORIDE 0.9% FLUSH
10.0000 mL | Freq: Once | INTRAVENOUS | Status: AC
  Administered 2020-08-01: 10 mL
  Filled 2020-08-01: qty 10

## 2020-08-01 MED ORDER — SODIUM CHLORIDE 0.9 % IV SOLN
INTRAVENOUS | Status: AC
  Filled 2020-08-01 (×2): qty 250

## 2020-08-01 MED ORDER — DENOSUMAB 120 MG/1.7ML ~~LOC~~ SOLN
120.0000 mg | Freq: Once | SUBCUTANEOUS | Status: AC
Start: 2020-08-01 — End: 2020-08-01
  Administered 2020-08-01: 120 mg via SUBCUTANEOUS

## 2020-08-01 MED ORDER — DENOSUMAB 120 MG/1.7ML ~~LOC~~ SOLN
SUBCUTANEOUS | Status: AC
  Filled 2020-08-01: qty 1.7

## 2020-08-01 MED ORDER — SODIUM CHLORIDE 0.9 % IV SOLN
240.0000 mg | Freq: Once | INTRAVENOUS | Status: AC
  Administered 2020-08-01: 240 mg via INTRAVENOUS
  Filled 2020-08-01: qty 24

## 2020-08-01 MED ORDER — LIDOCAINE-PRILOCAINE 2.5-2.5 % EX CREA: 1.0000 "application " | TOPICAL_CREAM | Freq: Every day | CUTANEOUS | 1 refills | Status: DC | PRN

## 2020-08-01 MED ORDER — HEPARIN SOD (PORK) LOCK FLUSH 100 UNIT/ML IV SOLN
500.0000 [IU] | Freq: Once | INTRAVENOUS | Status: AC | PRN
  Administered 2020-08-01: 500 [IU]
  Filled 2020-08-01: qty 5

## 2020-08-01 NOTE — Patient Instructions (Signed)
[facility] Discharge Instructions for Patients Receiving Chemotherapy  Today you received the following immunotherapy agent: Nivolumab (Opdivo)  To help prevent nausea and vomiting after your treatment, we encourage you to take your nausea medication as directed by your MD.   If you develop nausea and vomiting that is not controlled by your nausea medication, call the clinic.   BELOW ARE SYMPTOMS THAT SHOULD BE REPORTED IMMEDIATELY:  *FEVER GREATER THAN 100.5 F  *CHILLS WITH OR WITHOUT FEVER  NAUSEA AND VOMITING THAT IS NOT CONTROLLED WITH YOUR NAUSEA MEDICATION  *UNUSUAL SHORTNESS OF BREATH  *UNUSUAL BRUISING OR BLEEDING  TENDERNESS IN MOUTH AND THROAT WITH OR WITHOUT PRESENCE OF ULCERS  *URINARY PROBLEMS  *BOWEL PROBLEMS  UNUSUAL RASH Items with * indicate a potential emergency and should be followed up as soon as possible.  Feel free to call the clinic should you have any questions or concerns. The clinic phone number is [phone].  Please show the CHEMO ALERT CARD at check-in to the Emergency Department and triage nurse.  Denosumab injection What is this medicine? DENOSUMAB (den oh sue mab) slows bone breakdown. Prolia is used to treat osteoporosis in women after menopause and in men, and in people who are taking corticosteroids for 6 months or more. Xgeva is used to treat a high calcium level due to cancer and to prevent bone fractures and other bone problems caused by multiple myeloma or cancer bone metastases. Xgeva is also used to treat giant cell tumor of the bone. This medicine may be used for other purposes; ask your health care provider or pharmacist if you have questions. COMMON BRAND NAME(S): Prolia, XGEVA What should I tell my health care provider before I take this medicine? They need to know if you have any of these conditions:  dental disease  having surgery or tooth extraction  infection  kidney disease  low levels of calcium or  Vitamin D in the blood  malnutrition  on hemodialysis  skin conditions or sensitivity  thyroid or parathyroid disease  an unusual reaction to denosumab, other medicines, foods, dyes, or preservatives  pregnant or trying to get pregnant  breast-feeding How should I use this medicine? This medicine is for injection under the skin. It is given by a health care professional in a hospital or clinic setting. A special MedGuide will be given to you before each treatment. Be sure to read this information carefully each time. For Prolia, talk to your pediatrician regarding the use of this medicine in children. Special care may be needed. For Xgeva, talk to your pediatrician regarding the use of this medicine in children. While this drug may be prescribed for children as young as 13 years for selected conditions, precautions do apply. Overdosage: If you think you have taken too much of this medicine contact a poison control center or emergency room at once. NOTE: This medicine is only for you. Do not share this medicine with others. What if I miss a dose? It is important not to miss your dose. Call your doctor or health care professional if you are unable to keep an appointment. What may interact with this medicine? Do not take this medicine with any of the following medications:  other medicines containing denosumab This medicine may also interact with the following medications:  medicines that lower your chance of fighting infection  steroid medicines like prednisone or cortisone This list may not describe all possible interactions. Give your health care provider a list of all   the medicines, herbs, non-prescription drugs, or dietary supplements you use. Also tell them if you smoke, drink alcohol, or use illegal drugs. Some items may interact with your medicine. What should I watch for while using this medicine? Visit your doctor or health care professional for regular checks on your  progress. Your doctor or health care professional may order blood tests and other tests to see how you are doing. Call your doctor or health care professional for advice if you get a fever, chills or sore throat, or other symptoms of a cold or flu. Do not treat yourself. This drug may decrease your body's ability to fight infection. Try to avoid being around people who are sick. You should make sure you get enough calcium and vitamin D while you are taking this medicine, unless your doctor tells you not to. Discuss the foods you eat and the vitamins you take with your health care professional. See your dentist regularly. Brush and floss your teeth as directed. Before you have any dental work done, tell your dentist you are receiving this medicine. Do not become pregnant while taking this medicine or for 5 months after stopping it. Talk with your doctor or health care professional about your birth control options while taking this medicine. Women should inform their doctor if they wish to become pregnant or think they might be pregnant. There is a potential for serious side effects to an unborn child. Talk to your health care professional or pharmacist for more information. What side effects may I notice from receiving this medicine? Side effects that you should report to your doctor or health care professional as soon as possible:  allergic reactions like skin rash, itching or hives, swelling of the face, lips, or tongue  bone pain  breathing problems  dizziness  jaw pain, especially after dental work  redness, blistering, peeling of the skin  signs and symptoms of infection like fever or chills; cough; sore throat; pain or trouble passing urine  signs of low calcium like fast heartbeat, muscle cramps or muscle pain; pain, tingling, numbness in the hands or feet; seizures  unusual bleeding or bruising  unusually weak or tired Side effects that usually do not require medical attention  (report to your doctor or health care professional if they continue or are bothersome):  constipation  diarrhea  headache  joint pain  loss of appetite  muscle pain  runny nose  tiredness  upset stomach This list may not describe all possible side effects. Call your doctor for medical advice about side effects. You may report side effects to FDA at 1-800-FDA-1088. Where should I keep my medicine? This medicine is only given in a clinic, doctor's office, or other health care setting and will not be stored at home. NOTE: This sheet is a summary. It may not cover all possible information. If you have questions about this medicine, talk to your doctor, pharmacist, or health care provider.  2021 [employer]vier/Gold Standard (2017-08-15 16:10:44)

## 2020-08-01 NOTE — Progress Notes (Signed)
Per [name], ok for treatment with today's labs and no TSH needed.  Per [name], ok to give [facility] today with calcium level 7.8 and corrected calcium 8.3. Pt. Declined to stay for all of IV fluids.

## 2020-08-07 ENCOUNTER — Telehealth: Payer: Self-pay | Admitting: Hematology

## 2020-08-07 NOTE — Telephone Encounter (Signed)
Scheduled follow-up appointment per 4/12 los. Patient is aware. 

## 2020-08-15 ENCOUNTER — Other Ambulatory Visit: Payer: Self-pay | Admitting: *Deleted

## 2020-08-15 DIAGNOSIS — C642 Malignant neoplasm of left kidney, except renal pelvis: Secondary | ICD-10-CM

## 2020-08-16 ENCOUNTER — Inpatient Hospital Stay: Payer: Medicare Other

## 2020-08-16 ENCOUNTER — Ambulatory Visit: Payer: Medicare Other

## 2020-08-16 ENCOUNTER — Other Ambulatory Visit: Payer: Medicare Other

## 2020-08-16 ENCOUNTER — Other Ambulatory Visit: Payer: Self-pay

## 2020-08-16 VITALS — BP 139/62 | HR 53 | Temp 97.7°F | Resp 18 | Wt 126.5 lb

## 2020-08-16 DIAGNOSIS — C642 Malignant neoplasm of left kidney, except renal pelvis: Secondary | ICD-10-CM

## 2020-08-16 DIAGNOSIS — Z5112 Encounter for antineoplastic immunotherapy: Secondary | ICD-10-CM | POA: Diagnosis not present

## 2020-08-16 DIAGNOSIS — Z95828 Presence of other vascular implants and grafts: Secondary | ICD-10-CM

## 2020-08-16 DIAGNOSIS — C7951 Secondary malignant neoplasm of bone: Secondary | ICD-10-CM

## 2020-08-16 DIAGNOSIS — Z7189 Other specified counseling: Secondary | ICD-10-CM

## 2020-08-16 LAB — CMP (CANCER CENTER ONLY)
ALT: 15 U/L (ref 0–44)
AST: 22 U/L (ref 15–41)
Albumin: 3.7 g/dL (ref 3.5–5.0)
Alkaline Phosphatase: 61 U/L (ref 38–126)
Anion gap: 10 (ref 5–15)
BUN: 39 mg/dL — ABNORMAL HIGH (ref 8–23)
CO2: 14 mmol/L — ABNORMAL LOW (ref 22–32)
Calcium: 8.6 mg/dL — ABNORMAL LOW (ref 8.9–10.3)
Chloride: 114 mmol/L — ABNORMAL HIGH (ref 98–111)
Creatinine: 2.16 mg/dL — ABNORMAL HIGH (ref 0.44–1.00)
GFR, Estimated: 23 mL/min — ABNORMAL LOW (ref >60)
Glucose, Bld: 161 mg/dL — ABNORMAL HIGH (ref 70–99)
Potassium: 4.1 mmol/L (ref 3.5–5.1)
Sodium: 138 mmol/L (ref 135–145)
Total Bilirubin: 0.4 mg/dL (ref 0.3–1.2)
Total Protein: 6.7 g/dL (ref 6.5–8.1)

## 2020-08-16 LAB — CBC WITH DIFFERENTIAL (CANCER CENTER ONLY)
Abs Immature Granulocytes: 0.09 10*3/uL — ABNORMAL HIGH (ref 0.00–0.07)
Basophils Absolute: 0.1 10*3/uL (ref 0.0–0.1)
Basophils Relative: 1 %
Eosinophils Absolute: 0.2 10*3/uL (ref 0.0–0.5)
Eosinophils Relative: 2 %
HCT: 36.5 % (ref 36.0–46.0)
Hemoglobin: 11.1 g/dL — ABNORMAL LOW (ref 12.0–15.0)
Immature Granulocytes: 1 %
Lymphocytes Relative: 15 %
Lymphs Abs: 1.4 10*3/uL (ref 0.7–4.0)
MCH: 26.9 pg (ref 26.0–34.0)
MCHC: 30.4 g/dL (ref 30.0–36.0)
MCV: 88.4 fL (ref 80.0–100.0)
Monocytes Absolute: 1 10*3/uL (ref 0.1–1.0)
Monocytes Relative: 10 %
Neutro Abs: 7.1 10*3/uL (ref 1.7–7.7)
Neutrophils Relative %: 71 %
Platelet Count: 239 10*3/uL (ref 150–400)
RBC: 4.13 MIL/uL (ref 3.87–5.11)
RDW: 15.1 % (ref 11.5–15.5)
WBC Count: 9.9 10*3/uL (ref 4.0–10.5)
nRBC: 0 % (ref 0.0–0.2)

## 2020-08-16 MED ORDER — SODIUM CHLORIDE 0.9% FLUSH
10.0000 mL | INTRAVENOUS | Status: DC | PRN
  Administered 2020-08-16: 10 mL
  Filled 2020-08-16: qty 10

## 2020-08-16 MED ORDER — HEPARIN SOD (PORK) LOCK FLUSH 100 UNIT/ML IV SOLN
500.0000 [IU] | Freq: Once | INTRAVENOUS | Status: AC | PRN
  Administered 2020-08-16: 500 [IU]
  Filled 2020-08-16: qty 5

## 2020-08-16 MED ORDER — SODIUM CHLORIDE 0.9 % IV SOLN
240.0000 mg | Freq: Once | INTRAVENOUS | Status: AC
  Administered 2020-08-16: 240 mg via INTRAVENOUS
  Filled 2020-08-16: qty 24

## 2020-08-16 MED ORDER — SODIUM CHLORIDE 0.9% FLUSH
10.0000 mL | Freq: Once | INTRAVENOUS | Status: AC
  Administered 2020-08-16: 10 mL
  Filled 2020-08-16: qty 10

## 2020-08-16 MED ORDER — SODIUM CHLORIDE 0.9 % IV SOLN
INTRAVENOUS | Status: AC
  Filled 2020-08-16 (×2): qty 250

## 2020-08-16 NOTE — Progress Notes (Signed)
Per [name], okay for patient to proceed with treatment today with creatine 2.14.

## 2020-08-16 NOTE — Patient Instructions (Signed)
[facility] Discharge Instructions for Patients Receiving Chemotherapy  Today you received the following immunotherapy agent: Nivolumab (Opdivo)  To help prevent nausea and vomiting after your treatment, we encourage you to take your nausea medication as directed by your MD.   If you develop nausea and vomiting that is not controlled by your nausea medication, call the clinic.   BELOW ARE SYMPTOMS THAT SHOULD BE REPORTED IMMEDIATELY:  *FEVER GREATER THAN 100.5 F  *CHILLS WITH OR WITHOUT FEVER  NAUSEA AND VOMITING THAT IS NOT CONTROLLED WITH YOUR NAUSEA MEDICATION  *UNUSUAL SHORTNESS OF BREATH  *UNUSUAL BRUISING OR BLEEDING  TENDERNESS IN MOUTH AND THROAT WITH OR WITHOUT PRESENCE OF ULCERS  *URINARY PROBLEMS  *BOWEL PROBLEMS  UNUSUAL RASH Items with * indicate a potential emergency and should be followed up as soon as possible.  Feel free to call the clinic should you have any questions or concerns. The clinic phone number is [phone].  Please show the CHEMO ALERT CARD at check-in to the Emergency Department and triage nurse.  Denosumab injection What is this medicine? DENOSUMAB (den oh sue mab) slows bone breakdown. Prolia is used to treat osteoporosis in women after menopause and in men, and in people who are taking corticosteroids for 6 months or more. Xgeva is used to treat a high calcium level due to cancer and to prevent bone fractures and other bone problems caused by multiple myeloma or cancer bone metastases. Xgeva is also used to treat giant cell tumor of the bone. This medicine may be used for other purposes; ask your health care provider or pharmacist if you have questions. COMMON BRAND NAME(S): Prolia, XGEVA What should I tell my health care provider before I take this medicine? They need to know if you have any of these conditions:  dental disease  having surgery or tooth extraction  infection  kidney disease  low levels of calcium or  Vitamin D in the blood  malnutrition  on hemodialysis  skin conditions or sensitivity  thyroid or parathyroid disease  an unusual reaction to denosumab, other medicines, foods, dyes, or preservatives  pregnant or trying to get pregnant  breast-feeding How should I use this medicine? This medicine is for injection under the skin. It is given by a health care professional in a hospital or clinic setting. A special MedGuide will be given to you before each treatment. Be sure to read this information carefully each time. For Prolia, talk to your pediatrician regarding the use of this medicine in children. Special care may be needed. For Xgeva, talk to your pediatrician regarding the use of this medicine in children. While this drug may be prescribed for children as young as 13 years for selected conditions, precautions do apply. Overdosage: If you think you have taken too much of this medicine contact a poison control center or emergency room at once. NOTE: This medicine is only for you. Do not share this medicine with others. What if I miss a dose? It is important not to miss your dose. Call your doctor or health care professional if you are unable to keep an appointment. What may interact with this medicine? Do not take this medicine with any of the following medications:  other medicines containing denosumab This medicine may also interact with the following medications:  medicines that lower your chance of fighting infection  steroid medicines like prednisone or cortisone This list may not describe all possible interactions. Give your health care provider a list of all   the medicines, herbs, non-prescription drugs, or dietary supplements you use. Also tell them if you smoke, drink alcohol, or use illegal drugs. Some items may interact with your medicine. What should I watch for while using this medicine? Visit your doctor or health care professional for regular checks on your  progress. Your doctor or health care professional may order blood tests and other tests to see how you are doing. Call your doctor or health care professional for advice if you get a fever, chills or sore throat, or other symptoms of a cold or flu. Do not treat yourself. This drug may decrease your body's ability to fight infection. Try to avoid being around people who are sick. You should make sure you get enough calcium and vitamin D while you are taking this medicine, unless your doctor tells you not to. Discuss the foods you eat and the vitamins you take with your health care professional. See your dentist regularly. Brush and floss your teeth as directed. Before you have any dental work done, tell your dentist you are receiving this medicine. Do not become pregnant while taking this medicine or for 5 months after stopping it. Talk with your doctor or health care professional about your birth control options while taking this medicine. Women should inform their doctor if they wish to become pregnant or think they might be pregnant. There is a potential for serious side effects to an unborn child. Talk to your health care professional or pharmacist for more information. What side effects may I notice from receiving this medicine? Side effects that you should report to your doctor or health care professional as soon as possible:  allergic reactions like skin rash, itching or hives, swelling of the face, lips, or tongue  bone pain  breathing problems  dizziness  jaw pain, especially after dental work  redness, blistering, peeling of the skin  signs and symptoms of infection like fever or chills; cough; sore throat; pain or trouble passing urine  signs of low calcium like fast heartbeat, muscle cramps or muscle pain; pain, tingling, numbness in the hands or feet; seizures  unusual bleeding or bruising  unusually weak or tired Side effects that usually do not require medical attention  (report to your doctor or health care professional if they continue or are bothersome):  constipation  diarrhea  headache  joint pain  loss of appetite  muscle pain  runny nose  tiredness  upset stomach This list may not describe all possible side effects. Call your doctor for medical advice about side effects. You may report side effects to FDA at 1-800-FDA-1088. Where should I keep my medicine? This medicine is only given in a clinic, doctor's office, or other health care setting and will not be stored at home. NOTE: This sheet is a summary. It may not cover all possible information. If you have questions about this medicine, talk to your doctor, pharmacist, or health care provider.  2021 Elsevier/Gold Standard (2017-08-15 16:10:44)

## 2020-08-29 ENCOUNTER — Ambulatory Visit (HOSPITAL_COMMUNITY)
Admission: RE | Admit: 2020-08-29 | Discharge: 2020-08-29 | Disposition: A | Discharge status: Home / Self Care | Payer: Medicare Other | Source: Ambulatory Visit | Attending: Hematology | Admitting: Hematology

## 2020-08-29 ENCOUNTER — Other Ambulatory Visit: Payer: Self-pay

## 2020-08-29 DIAGNOSIS — C642 Malignant neoplasm of left kidney, except renal pelvis: Secondary | ICD-10-CM | POA: Diagnosis not present

## 2020-08-29 DIAGNOSIS — Z905 Acquired absence of kidney: Secondary | ICD-10-CM | POA: Diagnosis not present

## 2020-08-29 DIAGNOSIS — C7951 Secondary malignant neoplasm of bone: Secondary | ICD-10-CM | POA: Diagnosis present

## 2020-08-29 DIAGNOSIS — Z79899 Other long term (current) drug therapy: Secondary | ICD-10-CM | POA: Insufficient documentation

## 2020-08-29 LAB — GLUCOSE, CAPILLARY: Glucose-Capillary: 125 mg/dL — ABNORMAL HIGH (ref 70–99)

## 2020-08-29 MED ORDER — FLUDEOXYGLUCOSE F - 18 (FDG) INJECTION
7.1000 | Freq: Once | INTRAVENOUS | Status: AC | PRN
  Administered 2020-08-29: 6.2 via INTRAVENOUS

## 2020-08-30 ENCOUNTER — Inpatient Hospital Stay: Payer: Medicare Other

## 2020-08-30 ENCOUNTER — Other Ambulatory Visit: Payer: Medicare Other

## 2020-08-30 ENCOUNTER — Ambulatory Visit: Payer: Medicare Other

## 2020-08-30 ENCOUNTER — Other Ambulatory Visit: Payer: Self-pay | Admitting: Hematology

## 2020-08-30 ENCOUNTER — Inpatient Hospital Stay: Payer: Medicare Other | Attending: Hematology

## 2020-08-30 VITALS — BP 118/46 | HR 56 | Temp 98.1°F | Resp 16 | Wt 127.0 lb

## 2020-08-30 DIAGNOSIS — E785 Hyperlipidemia, unspecified: Secondary | ICD-10-CM | POA: Insufficient documentation

## 2020-08-30 DIAGNOSIS — Z79899 Other long term (current) drug therapy: Secondary | ICD-10-CM | POA: Diagnosis not present

## 2020-08-30 DIAGNOSIS — C787 Secondary malignant neoplasm of liver and intrahepatic bile duct: Secondary | ICD-10-CM | POA: Diagnosis not present

## 2020-08-30 DIAGNOSIS — D649 Anemia, unspecified: Secondary | ICD-10-CM | POA: Diagnosis not present

## 2020-08-30 DIAGNOSIS — C7951 Secondary malignant neoplasm of bone: Secondary | ICD-10-CM | POA: Diagnosis not present

## 2020-08-30 DIAGNOSIS — I2510 Atherosclerotic heart disease of native coronary artery without angina pectoris: Secondary | ICD-10-CM | POA: Diagnosis not present

## 2020-08-30 DIAGNOSIS — R110 Nausea: Secondary | ICD-10-CM | POA: Diagnosis not present

## 2020-08-30 DIAGNOSIS — Z7189 Other specified counseling: Secondary | ICD-10-CM

## 2020-08-30 DIAGNOSIS — K449 Diaphragmatic hernia without obstruction or gangrene: Secondary | ICD-10-CM | POA: Diagnosis not present

## 2020-08-30 DIAGNOSIS — C642 Malignant neoplasm of left kidney, except renal pelvis: Secondary | ICD-10-CM | POA: Diagnosis not present

## 2020-08-30 DIAGNOSIS — E440 Moderate protein-calorie malnutrition: Secondary | ICD-10-CM | POA: Insufficient documentation

## 2020-08-30 DIAGNOSIS — Z95828 Presence of other vascular implants and grafts: Secondary | ICD-10-CM

## 2020-08-30 DIAGNOSIS — K219 Gastro-esophageal reflux disease without esophagitis: Secondary | ICD-10-CM | POA: Diagnosis not present

## 2020-08-30 DIAGNOSIS — Z5112 Encounter for antineoplastic immunotherapy: Secondary | ICD-10-CM | POA: Insufficient documentation

## 2020-08-30 DIAGNOSIS — I739 Peripheral vascular disease, unspecified: Secondary | ICD-10-CM | POA: Insufficient documentation

## 2020-08-30 DIAGNOSIS — Z87891 Personal history of nicotine dependence: Secondary | ICD-10-CM | POA: Diagnosis not present

## 2020-08-30 DIAGNOSIS — I700 Atherosclerosis of aorta: Secondary | ICD-10-CM | POA: Diagnosis not present

## 2020-08-30 DIAGNOSIS — M1990 Unspecified osteoarthritis, unspecified site: Secondary | ICD-10-CM | POA: Diagnosis not present

## 2020-08-30 DIAGNOSIS — E119 Type 2 diabetes mellitus without complications: Secondary | ICD-10-CM | POA: Insufficient documentation

## 2020-08-30 DIAGNOSIS — C7800 Secondary malignant neoplasm of unspecified lung: Secondary | ICD-10-CM | POA: Diagnosis not present

## 2020-08-30 DIAGNOSIS — E039 Hypothyroidism, unspecified: Secondary | ICD-10-CM | POA: Diagnosis not present

## 2020-08-30 DIAGNOSIS — E538 Deficiency of other specified B group vitamins: Secondary | ICD-10-CM | POA: Insufficient documentation

## 2020-08-30 DIAGNOSIS — E871 Hypo-osmolality and hyponatremia: Secondary | ICD-10-CM | POA: Diagnosis not present

## 2020-08-30 LAB — CBC WITH DIFFERENTIAL (CANCER CENTER ONLY)
Abs Immature Granulocytes: 0.06 10*3/uL (ref 0.00–0.07)
Basophils Absolute: 0.1 10*3/uL (ref 0.0–0.1)
Basophils Relative: 1 %
Eosinophils Absolute: 0.2 10*3/uL (ref 0.0–0.5)
Eosinophils Relative: 2 %
HCT: 35 % — ABNORMAL LOW (ref 36.0–46.0)
Hemoglobin: 11 g/dL — ABNORMAL LOW (ref 12.0–15.0)
Immature Granulocytes: 1 %
Lymphocytes Relative: 18 %
Lymphs Abs: 1.4 10*3/uL (ref 0.7–4.0)
MCH: 27.2 pg (ref 26.0–34.0)
MCHC: 31.4 g/dL (ref 30.0–36.0)
MCV: 86.4 fL (ref 80.0–100.0)
Monocytes Absolute: 0.8 10*3/uL (ref 0.1–1.0)
Monocytes Relative: 10 %
Neutro Abs: 5.3 10*3/uL (ref 1.7–7.7)
Neutrophils Relative %: 68 %
Platelet Count: 299 10*3/uL (ref 150–400)
RBC: 4.05 MIL/uL (ref 3.87–5.11)
RDW: 15.3 % (ref 11.5–15.5)
WBC Count: 7.9 10*3/uL (ref 4.0–10.5)
nRBC: 0 % (ref 0.0–0.2)

## 2020-08-30 LAB — CMP (CANCER CENTER ONLY)
ALT: 49 U/L — ABNORMAL HIGH (ref 0–44)
AST: 38 U/L (ref 15–41)
Albumin: 3.5 g/dL (ref 3.5–5.0)
Alkaline Phosphatase: 85 U/L (ref 38–126)
Anion gap: 11 (ref 5–15)
BUN: 32 mg/dL — ABNORMAL HIGH (ref 8–23)
CO2: 17 mmol/L — ABNORMAL LOW (ref 22–32)
Calcium: 9.1 mg/dL (ref 8.9–10.3)
Chloride: 111 mmol/L (ref 98–111)
Creatinine: 2.12 mg/dL — ABNORMAL HIGH (ref 0.44–1.00)
GFR, Estimated: 23 mL/min — ABNORMAL LOW (ref >60)
Glucose, Bld: 127 mg/dL — ABNORMAL HIGH (ref 70–99)
Potassium: 3.9 mmol/L (ref 3.5–5.1)
Sodium: 139 mmol/L (ref 135–145)
Total Bilirubin: 0.6 mg/dL (ref 0.3–1.2)
Total Protein: 6.5 g/dL (ref 6.5–8.1)

## 2020-08-30 MED ORDER — SODIUM CHLORIDE 0.9 % IV SOLN
240.0000 mg | Freq: Once | INTRAVENOUS | Status: AC
  Administered 2020-08-30: 240 mg via INTRAVENOUS
  Filled 2020-08-30: qty 24

## 2020-08-30 MED ORDER — SODIUM CHLORIDE 0.9 % IV SOLN
INTRAVENOUS | Status: AC
  Filled 2020-08-30 (×2): qty 250

## 2020-08-30 MED ORDER — SODIUM CHLORIDE 0.9% FLUSH
10.0000 mL | INTRAVENOUS | Status: DC | PRN
  Administered 2020-08-30: 10 mL
  Filled 2020-08-30: qty 10

## 2020-08-30 MED ORDER — SODIUM CHLORIDE 0.9% FLUSH
10.0000 mL | Freq: Once | INTRAVENOUS | Status: AC
  Administered 2020-08-30: 10 mL
  Filled 2020-08-30: qty 10

## 2020-08-30 MED ORDER — HEPARIN SOD (PORK) LOCK FLUSH 100 UNIT/ML IV SOLN
500.0000 [IU] | Freq: Once | INTRAVENOUS | Status: AC | PRN
  Administered 2020-08-30: 500 [IU]
  Filled 2020-08-30: qty 5

## 2020-08-30 NOTE — Patient Instructions (Signed)
[facility] Discharge Instructions for Patients Receiving Chemotherapy  Today you received the following immunotherapy agent: Nivolumab (Opdivo)  To help prevent nausea and vomiting after your treatment, we encourage you to take your nausea medication as directed by your MD.   If you develop nausea and vomiting that is not controlled by your nausea medication, call the clinic.   BELOW ARE SYMPTOMS THAT SHOULD BE REPORTED IMMEDIATELY:  *FEVER GREATER THAN 100.5 F  *CHILLS WITH OR WITHOUT FEVER  NAUSEA AND VOMITING THAT IS NOT CONTROLLED WITH YOUR NAUSEA MEDICATION  *UNUSUAL SHORTNESS OF BREATH  *UNUSUAL BRUISING OR BLEEDING  TENDERNESS IN MOUTH AND THROAT WITH OR WITHOUT PRESENCE OF ULCERS  *URINARY PROBLEMS  *BOWEL PROBLEMS  UNUSUAL RASH Items with * indicate a potential emergency and should be followed up as soon as possible.  Feel free to call the clinic should you have any questions or concerns. The clinic phone number is [phone].  Please show the [place] at check-in to the Emergency Department and triage nurse.  Denosumab injection What is this medicine? DENOSUMAB (den oh sue mab) slows bone breakdown. Prolia is used to treat osteoporosis in women after menopause and in men, and in people who are taking corticosteroids for 6 months or more. Delton See is used to treat a high calcium level due to cancer and to prevent bone fractures and other bone problems caused by multiple myeloma or cancer bone metastases. Delton See is also used to treat giant cell tumor of the bone. This medicine may be used for other purposes; ask your health care provider or pharmacist if you have questions. COMMON BRAND NAME(S): Prolia, XGEVA What should I tell my health care provider before I take this medicine? They need to know if you have any of these conditions:  dental disease  having surgery or tooth extraction  infection  kidney disease  low levels of calcium or  Vitamin D in the blood  malnutrition  on hemodialysis  skin conditions or sensitivity  thyroid or parathyroid disease  an unusual reaction to denosumab, other medicines, foods, dyes, or preservatives  pregnant or trying to get pregnant  breast-feeding How should I use this medicine? This medicine is for injection under the skin. It is given by a health care professional in a hospital or clinic setting. A special MedGuide will be given to you before each treatment. Be sure to read this information carefully each time. For Prolia, talk to your pediatrician regarding the use of this medicine in children. Special care may be needed. For Delton See, talk to your pediatrician regarding the use of this medicine in children. While this drug may be prescribed for children as young as 13 years for selected conditions, precautions do apply. Overdosage: If you think you have taken too much of this medicine contact a poison control center or emergency room at once. NOTE: This medicine is only for you. Do not share this medicine with others. What if I miss a dose? It is important not to miss your dose. Call your doctor or health care professional if you are unable to keep an appointment. What may interact with this medicine? Do not take this medicine with any of the following medications:  other medicines containing denosumab This medicine may also interact with the following medications:  medicines that lower your chance of fighting infection  steroid medicines like prednisone or cortisone This list may not describe all possible interactions. Give your health care provider a list of all  the medicines, herbs, non-prescription drugs, or dietary supplements you use. Also tell them if you smoke, drink alcohol, or use illegal drugs. Some items may interact with your medicine. What should I watch for while using this medicine? Visit your doctor or health care professional for regular checks on your  progress. Your doctor or health care professional may order blood tests and other tests to see how you are doing. Call your doctor or health care professional for advice if you get a fever, chills or sore throat, or other symptoms of a cold or flu. Do not treat yourself. This drug may decrease your body's ability to fight infection. Try to avoid being around people who are sick. You should make sure you get enough calcium and vitamin D while you are taking this medicine, unless your doctor tells you not to. Discuss the foods you eat and the vitamins you take with your health care professional. See your dentist regularly. Brush and floss your teeth as directed. Before you have any dental work done, tell your dentist you are receiving this medicine. Do not become pregnant while taking this medicine or for 5 months after stopping it. Talk with your doctor or health care professional about your birth control options while taking this medicine. Women should inform their doctor if they wish to become pregnant or think they might be pregnant. There is a potential for serious side effects to an unborn child. Talk to your health care professional or pharmacist for more information. What side effects may I notice from receiving this medicine? Side effects that you should report to your doctor or health care professional as soon as possible:  allergic reactions like skin rash, itching or hives, swelling of the face, lips, or tongue  bone pain  breathing problems  dizziness  jaw pain, especially after dental work  redness, blistering, peeling of the skin  signs and symptoms of infection like fever or chills; cough; sore throat; pain or trouble passing urine  signs of low calcium like fast heartbeat, muscle cramps or muscle pain; pain, tingling, numbness in the hands or feet; seizures  unusual bleeding or bruising  unusually weak or tired Side effects that usually do not require medical attention  (report to your doctor or health care professional if they continue or are bothersome):  constipation  diarrhea  headache  joint pain  loss of appetite  muscle pain  runny nose  tiredness  upset stomach This list may not describe all possible side effects. Call your doctor for medical advice about side effects. You may report side effects to FDA at 1-800-FDA-1088. Where should I keep my medicine? This medicine is only given in a clinic, doctor's office, or other health care setting and will not be stored at home. NOTE: This sheet is a summary. It may not cover all possible information. If you have questions about this medicine, talk to your doctor, pharmacist, or health care provider.  2021 [employer]vier/Gold Standard (2017-08-15 16:10:44)

## 2020-09-12 ENCOUNTER — Other Ambulatory Visit: Payer: Self-pay | Admitting: *Deleted

## 2020-09-12 DIAGNOSIS — C642 Malignant neoplasm of left kidney, except renal pelvis: Secondary | ICD-10-CM

## 2020-09-12 DIAGNOSIS — C7951 Secondary malignant neoplasm of bone: Secondary | ICD-10-CM

## 2020-09-12 NOTE — Progress Notes (Signed)
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 She is here for next cycle of Nivolumab. The patient's last visit with [name] was on 08/01/2020. The pt reports that she is doing well overall.  The pt reports that she had a [name] for arteries that showed severe peripheral arterial disease. She notes much frustration over this. She gets frequent sores on her toes and they turn blue in color. The pt saw Dr. Joelyn Oms and they noted they would want to put stints down there. They desire to do an angiogram, but would have to use the contrast dye to being so narrow down there. She was called this week to schedule an appointment to put in a dialysis port and is frustrated and confused regarding this. She was not given any warning nor knew what this call was about.    The pt notes the ulcers on her feet started two months ago. They have not worsened since that period. The pt does get cramps in her feet that she notes are very deep. She also has some hip pain that is chronic and bothersome to the pt. She also notes her left arm is numb from her shoulder to her fingertips. She believes this could just be due to staying cold all the time.  Of note since the patient's last visit, pt has had PET Skull Base to Thigh on 08/29/2020, which revealed "1. Status post left nephrectomy. No findings of recurrent or metastatic disease. 2. Incidental findings, including: Coronary artery atherosclerosis. Aortic Atherosclerosis (ICD10-I70.0). Pelvic floor laxity with cystocele. Chronic  bladder wall thickening may represent a component of outlet obstruction."  Lab results today 09/13/2020 of CBC w/diff and CMP is as follows: all values are WNL except for Hgb of 11.1, HCT of 35.5, CO2 of 17, Glucose of 144, BUN of 40, Creatinine of 1.98, GFR est of 25.  On review of systems, pt reports stress, foot ulcers, foot cramps, hip pain and denies SOB, abdominal pain, leg swelling, and any other symptoms.  MEDICAL HISTORY:   Past Medical History:  Diagnosis Date  . Anemia   . Arthritis    lower back, hips, hands  . Biliary stricture   . Diabetes mellitus ([place])    type 2   . Early cataracts, bilateral    Md just watching  . Elevated liver enzymes   . Family history of adverse reaction to anesthesia    Daughter hard to wake up  . Gallstones   . GERD (gastroesophageal reflux disease)    occasional - diet controlled  . History of blood transfusion 2018  . History of hiatal hernia   . HTN (hypertension)   . Hyperlipidemia   . Hypothyroidism   . left renal ca dx'd 2018   renal cancer - left kidney removed, pill chemo x 1 yr  . Myocardial infarction ([place]) 1991   no deficits  . SVD (spontaneous vaginal delivery)    x 3  . Wears glasses    Dyslipidemia Osteoarthritis Ex-smoker Coronary artery disease Thyroid disorder-was apparently on levothyroxine 25  g daily which has subsequently been discontinued . Mitral regurgitation B12 deficiency  hiatal hernia with esophagitis   SURGICAL HISTORY: Past Surgical History:  Procedure Laterality Date  . BALLOON DILATION N/A 07/23/2018   Procedure: BALLOON DILATION;  Surgeon: Rush Landmark Telford Nab., MD;  Location: [place];  Service: Gastroenterology;  Laterality: N/A;  . BILIARY BRUSHING  08/10/2018   Procedure: BILIARY BRUSHING;  Surgeon: Rush Landmark Telford Nab., MD;  Location: [place];  Service: Gastroenterology;;  . BILIARY BRUSHING  11/30/2018   Procedure: BILIARY BRUSHING;  Surgeon: Irving Copas., MD;   Location: [place];  Service: Gastroenterology;;  . BILIARY DILATION  08/10/2018   Procedure: BILIARY DILATION;  Surgeon: Irving Copas., MD;  Location: [place];  Service: Gastroenterology;;  . BILIARY DILATION  08/27/2018   Procedure: BILIARY DILATION;  Surgeon: Irving Copas., MD;  Location: [place];  Service: Gastroenterology;;  . BILIARY DILATION  01/24/2020   Procedure: BILIARY DILATION;  Surgeon: Irving Copas., MD;  Location: [place];  Service: Gastroenterology;;  . BILIARY STENT PLACEMENT  08/10/2018   Procedure: BILIARY STENT PLACEMENT;  Surgeon: Irving Copas., MD;  Location: [place];  Service: Gastroenterology;;  . BILIARY STENT PLACEMENT  08/27/2018   Procedure: BILIARY STENT PLACEMENT;  Surgeon: Irving Copas., MD;  Location: [place];  Service: Gastroenterology;;  . BILIARY STENT PLACEMENT  11/30/2018   Procedure: BILIARY STENT PLACEMENT;  Surgeon: Irving Copas., MD;  Location: [place];  Service: Gastroenterology;;  . BIOPSY  07/23/2018   Procedure: BIOPSY;  Surgeon: Irving Copas., MD;  Location: [place];  Service: Gastroenterology;;  . BIOPSY  01/24/2020   Procedure: BIOPSY;  Surgeon: Irving Copas., MD;  Location: [place];  Service: Gastroenterology;;  . CHOLECYSTECTOMY N/A 09/02/2018   Procedure: LAPAROSCOPIC CHOLECYSTECTOMY;  Surgeon: Stark Klein, MD;  Location: [place];  Service: General;  Laterality: N/A;  . COLONOSCOPY     normal   . ENDOSCOPIC RETROGRADE CHOLANGIOPANCREATOGRAPHY (ERCP) WITH PROPOFOL N/A 08/10/2018   Procedure: ENDOSCOPIC RETROGRADE CHOLANGIOPANCREATOGRAPHY (ERCP) WITH PROPOFOL;  Surgeon: Irving Copas., MD;  Location: [place];  Service: Gastroenterology;  Laterality: N/A;  . ENDOSCOPIC RETROGRADE CHOLANGIOPANCREATOGRAPHY (ERCP) WITH PROPOFOL N/A 11/30/2018   Procedure: ENDOSCOPIC RETROGRADE CHOLANGIOPANCREATOGRAPHY (ERCP) WITH PROPOFOL;   Surgeon: Rush Landmark Telford Nab., MD;  Location: [place];  Service: Gastroenterology;  Laterality: N/A;  . ENDOSCOPIC RETROGRADE CHOLANGIOPANCREATOGRAPHY (ERCP) WITH PROPOFOL N/A 01/24/2020   Procedure: ENDOSCOPIC RETROGRADE CHOLANGIOPANCREATOGRAPHY (ERCP) WITH PROPOFOL;  Surgeon: Rush Landmark Telford Nab., MD;  Location: [place];  Service: Gastroenterology;  Laterality: N/A;  . ERCP N/A 07/23/2018   Procedure: ENDOSCOPIC RETROGRADE CHOLANGIOPANCREATOGRAPHY (ERCP);  Surgeon: Irving Copas., MD;  Location: [place];  Service: Gastroenterology;  Laterality: N/A;  . ERCP N/A 08/27/2018   Procedure: ENDOSCOPIC RETROGRADE CHOLANGIOPANCREATOGRAPHY (ERCP);  Surgeon: Irving Copas., MD;  Location: [place];  Service: Gastroenterology;  Laterality: N/A;  . ESOPHAGOGASTRODUODENOSCOPY N/A 01/24/2020   Procedure: ESOPHAGOGASTRODUODENOSCOPY (EGD);  Surgeon: Irving Copas., MD;  Location: [place];  Service: Gastroenterology;  Laterality: N/A;  . ESOPHAGOGASTRODUODENOSCOPY (EGD) WITH PROPOFOL N/A 08/27/2018   Procedure: ESOPHAGOGASTRODUODENOSCOPY (EGD) WITH PROPOFOL;  Surgeon: Rush Landmark Telford Nab., MD;  Location: [place];  Service: Gastroenterology;  Laterality: N/A;  . EUS N/A 08/27/2018   Procedure: ESOPHAGEAL ENDOSCOPIC ULTRASOUND (EUS) RADIAL;  Surgeon: Rush Landmark Telford Nab., MD;  Location: [place];  Service: Gastroenterology;  Laterality: N/A;  . FINE NEEDLE ASPIRATION  08/27/2018   Procedure: FINE NEEDLE ASPIRATION (FNA) LINEAR;  Surgeon: Irving Copas.,  MD;  Location: [place];  Service: Gastroenterology;;  . Fatima Blank HERNIA REPAIR N/A 06/23/2020   Procedure: OPEN INCISIONAL HERNIA REPAIR WITH MESH;  Surgeon: Felicie Morn, MD;  Location: [facility];  Service: General;  Laterality: N/A;  ROOM 2 STARTING AT 11:00AM FOR 90 MIN  . IR IMAGING GUIDED PORT INSERTION  01/08/2018  . LAPAROSCOPIC NEPHRECTOMY Left 08/01/2016   Procedure: LAPAROSCOPIC   RADICAL NEPHRECTOMY/ REPAIR OF UMBILICAL HERNIA;  Surgeon: Raynelle Bring, MD;  Location: [facility];  Service: Urology;  Laterality: Left;  . REMOVAL OF STONES  07/23/2018   Procedure: REMOVAL OF GALL STONES;  Surgeon: Rush Landmark Telford Nab., MD;  Location: [place];  Service: Gastroenterology;;  . REMOVAL OF STONES  08/10/2018   Procedure: REMOVAL OF STONES;  Surgeon: Irving Copas., MD;  Location: [place];  Service: Gastroenterology;;  . REMOVAL OF STONES  08/27/2018   Procedure: REMOVAL OF STONES;  Surgeon: Irving Copas., MD;  Location: [place];  Service: Gastroenterology;;  . REMOVAL OF STONES  01/24/2020   Procedure: REMOVAL OF STONES;  Surgeon: Irving Copas., MD;  Location: [place];  Service: Gastroenterology;;  . [name]  07/23/2018   Procedure: [name];  Surgeon: Irving Copas., MD;  Location: [place];  Service: Gastroenterology;;  . Lavell Islam REMOVAL  08/27/2018   Procedure: STENT REMOVAL;  Surgeon: Irving Copas., MD;  Location: [place];  Service: Gastroenterology;;  . Lavell Islam REMOVAL  11/30/2018   Procedure: STENT REMOVAL;  Surgeon: Irving Copas., MD;  Location: [place];  Service: Gastroenterology;;  . Lavell Islam REMOVAL  01/24/2020   Procedure: STENT REMOVAL;  Surgeon: Irving Copas., MD;  Location: [place];  Service: Gastroenterology;;  . UPPER GI ENDOSCOPY     x 1  . VAGINAL PROLAPSE REPAIR  11/18/2019   [facility]   No reported past surgeries EGD 05/01/2016 Dr. Earley Brooke Colonoscopy 03/2016 Dr. Earley Brooke  SOCIAL HISTORY: Social History   Socioeconomic History  . Marital status: Married    Spouse name: Not on file  . Number of children: 3  . Years of education: Not on file  . Highest education level: Not on file  Occupational History  . Occupation: retired  Tobacco Use  . Smoking status: Former Smoker    Packs/day: 1.00    Years: 8.00    Pack years: 8.00    Types: Cigarettes     Quit date: 06/18/1964    Years since quitting: 56.2  . Smokeless tobacco: Never Used  Vaping Use  . Vaping Use: Never used  Substance and Sexual Activity  . Alcohol use: No  . Drug use: No  . Sexual activity: Not Currently    Birth control/protection: Post-menopausal  Other Topics Concern  . Not on file  Social History Narrative   Married.  Three children   Social Determinants of Health   Financial Resource Strain: Not on file  Food Insecurity: Not on file  Transportation Needs: Not on file  Physical Activity: Not on file  Stress: Not on file  Social Connections: Not on file  Intimate Partner Violence: Not on file   Patient lives in [place] Works as a Solicitor part-time Ex-smoker quit long time ago In 1961 . Smoked 2 packs per day for 8 years prior to that .  or a lot of stress since her daughter is also getting treated for stage IV uterine cancer.  FAMILY HISTORY:  Mother deceased Father with asthma and emphysema died at 78 years with the  MI. Brother deceased of heart disease   ALLERGIES:  is allergic to doxycycline, adhesive [tape], nsaids, and statins.  MEDICATIONS:  Current Outpatient Medications  Medication Sig Dispense Refill  . amLODipine (NORVASC) 5 MG tablet Take 5 mg by mouth daily.   6  . atorvastatin (LIPITOR) 80 MG tablet Take 80 mg by mouth at bedtime.     . carvedilol (COREG) 6.25 MG tablet Take 6.25 mg by mouth 2 (two) times daily.  6  . clobetasol ointment (TEMOVATE) 3.55 % Apply 1 application topically 2 (two) times daily as needed (lichen sclerosis).     . dapagliflozin propanediol (FARXIGA) 10 MG TABS tablet     . Denosumab (XGEVA [place]) Inject into the skin every 6 (six) weeks.    . diphenhydrAMINE (BENADRYL) 25 MG tablet Take 25 mg by mouth at bedtime.    Marland Kitchen glimepiride (AMARYL) 1 MG tablet Take 1 mg by mouth daily with breakfast.    . hydrocortisone (CORTEF) 10 MG tablet Take 5-10 mg by mouth See admin instructions. Take 1 tablet  (10 mg) by mouth in the morning & 1/2 tablet (5 mg) by mouth in the evening.    Marland Kitchen levothyroxine (SYNTHROID) 75 MCG tablet Take 75 mcg by mouth daily before breakfast.     . Lidocaine 4 % PTCH Apply 1 patch topically daily as needed (pain).    Marland Kitchen lidocaine-prilocaine (EMLA) cream Apply 1 application topically daily as needed (prior to port access). 30 g 1  . Nivolumab (OPDIVO IV) Inject 1 Dose into the vein every 14 (fourteen) days. [place]    . omeprazole (PRILOSEC) 20 MG capsule TAKE 1 CAPSULE BY MOUTH TWICE DAILY BEFORE A MEAL(S) 60 capsule 3  . oxyCODONE (OXY IR/ROXICODONE) 5 MG immediate release tablet Take 1 tablet (5 mg total) by mouth every 4 (four) hours as needed for severe pain. (Patient not taking: Reported on 07/05/2020) 30 tablet 0  . sitaGLIPtin (JANUVIA) 50 MG tablet Take 50 mg by mouth daily.    Marland Kitchen SSD 1 % cream Apply topically 2 (two) times daily.     No current facility-administered medications for this visit.   Facility-Administered Medications Ordered in Other Visits  Medication Dose Route Frequency Provider Last Rate Last Admin  . sodium chloride flush (NS) 0.9 % injection 10 mL  10 mL Intracatheter PRN Truitt Merle, MD   10 mL at 11/20/18 1232    REVIEW OF SYSTEMS:   10 Point review of Systems was done is negative except as noted above.  PHYSICAL EXAMINATION:  ECOG FS:2 - Symptomatic, <50% confined to bed  Vitals:   09/13/20 0947  BP: (!) 131/59  Pulse: 61  Resp: 17  Temp: 97.6 F (36.4 C)  SpO2: 100%   Wt Readings from Last 3 Encounters:  09/13/20 127 lb 11.2 oz (57.9 kg)  08/30/20 127 lb (57.6 kg)  08/16/20 126 lb 8 oz (57.4 kg)   Body mass index is 25.79 kg/m.     GENERAL:alert, in no acute distress and comfortable SKIN: no acute rashes, no significant lesions EYES: conjunctiva are pink and non-injected, sclera anicteric OROPHARYNX: MMM, no exudates, no oropharyngeal erythema or ulceration NECK: supple, no JVD LYMPH:  no palpable  lymphadenopathy in the cervical, axillary or inguinal regions LUNGS: clear to auscultation b/l with normal respiratory effort HEART: regular rate & rhythm ABDOMEN:  normoactive bowel sounds , non tender, not distended. Extremity: no pedal edema PSYCH: alert & oriented x 3 with fluent speech NEURO: no focal  motor/sensory deficits  LABORATORY DATA:  I have reviewed the data as listed  . CBC Latest Ref Rng & Units 09/13/2020 08/30/2020 08/16/2020  WBC 4.0 - 10.5 K/uL 9.8 7.9 9.9  Hemoglobin 12.0 - 15.0 g/dL 11.1(L) 11.0(L) 11.1(L)  Hematocrit 36.0 - 46.0 % 35.5(L) 35.0(L) 36.5  Platelets 150 - 400 K/uL 293 299 239    CBC    Component Value Date/Time   WBC 9.8 09/13/2020 0940   WBC 9.5 07/05/2020 0921   RBC 4.13 09/13/2020 0940   HGB 11.1 (L) 09/13/2020 0940   HGB 11.2 (L) 04/04/2017 0830   HCT 35.5 (L) 09/13/2020 0940   HCT 35.2 04/04/2017 0830   PLT 293 09/13/2020 0940   PLT 250 04/04/2017 0830   MCV 86.0 09/13/2020 0940   MCV 107.6 (H) 04/04/2017 0830   MCH 26.9 09/13/2020 0940   MCHC 31.3 09/13/2020 0940   RDW 15.3 09/13/2020 0940   RDW 14.0 04/04/2017 0830   LYMPHSABS 1.4 09/13/2020 0940   LYMPHSABS 1.6 04/04/2017 0830   MONOABS 0.9 09/13/2020 0940   MONOABS 0.4 04/04/2017 0830   EOSABS 0.2 09/13/2020 0940   EOSABS 0.1 04/04/2017 0830   BASOSABS 0.1 09/13/2020 0940   BASOSABS 0.0 04/04/2017 0830     . CMP Latest Ref Rng & Units 09/13/2020 08/30/2020 08/16/2020  Glucose 70 - 99 mg/dL 144(H) 127(H) 161(H)  BUN 8 - 23 mg/dL 40(H) 32(H) 39(H)  Creatinine 0.44 - 1.00 mg/dL 1.98(H) 2.12(H) 2.16(H)  Sodium 135 - 145 mmol/L 138 139 138  Potassium 3.5 - 5.1 mmol/L 4.2 3.9 4.1  Chloride 98 - 111 mmol/L 110 111 114(H)  CO2 22 - 32 mmol/L 17(L) 17(L) 14(L)  Calcium 8.9 - 10.3 mg/dL 9.5 9.1 8.6(L)  Total Protein 6.5 - 8.1 g/dL 6.7 6.5 6.7  Total Bilirubin 0.3 - 1.2 mg/dL 0.5 0.6 0.4  Alkaline Phos 38 - 126 U/L 73 85 61  AST 15 - 41 U/L 25 38 22  ALT 0 - 44 U/L 26 49(H) 15    B12 level 299 (OSH)-->455  . Lab Results  Component Value Date   LDH 117 12/22/2019        08/27/18 Surgical Pathology from Cholecystectomy:         RADIOGRAPHIC STUDIES: I have personally reviewed the radiological images as listed and agreed with the findings in the report. Nm Pet Image Restag (ps) Skull Base To Thigh  Result Date: 01/03/2017 IMPRESSION: 1. There is a new lesion in the hepatic dome which is FDG avid and low in attenuation on CT imaging consistent with metastatic disease. Another region of uptake in the left hepatic lobe posteriorly demonstrates no CT correlate. A second subtle metastasis is not excluded on today's study. An MRI could better assess for other hepatic metastases if clinically warranted. 2. New metastatic lesion in the left side of T8. 3. New pulmonary nodule in the right upper lobe. This is too small to characterize but suspicious. Recommend attention on follow-up. No FDG avid nodules or other enlarging nodules. 4. The uptake at the previous left renal artery has almost resolved in the interval and is favored to be post therapeutic. Recommend attention on follow-up. 5. The metastasis in the posterior right hilum seen previously has resolved. Electronically Signed   By: Dorise Bullion III M.D   On: 01/03/2017 11:16   .NM PET Image Restag (PS) Skull Base To Thigh  Result Date: 08/29/2020 CLINICAL DATA:  Subsequent treatment strategy for follow-up of renal cell carcinoma, status  post left nephrectomy. Evaluate treatment response. EXAM: NUCLEAR MEDICINE PET SKULL BASE TO THIGH TECHNIQUE: 6.2 mCi F-18 FDG was injected intravenously. Full-ring PET imaging was performed from the skull base to thigh after the radiotracer. CT data was obtained and used for attenuation correction and anatomic localization. Fasting blood glucose: 125 mg/dl COMPARISON:  02/09/2020 FINDINGS: Mediastinal blood pool activity: SUV max 3.2 Liver activity: SUV max NA NECK: No areas of  abnormal hypermetabolism. Incidental CT findings: Bilateral carotid atherosclerosis. No cervical adenopathy. CHEST: No pulmonary parenchymal or thoracic nodal hypermetabolism. Incidental CT findings: Right Port-A-Cath tip at superior caval/atrial junction. Aortic and coronary artery calcification. Mild cardiomegaly. ABDOMEN/PELVIS: No abdominopelvic parenchymal or nodal hypermetabolism. Incidental CT findings: Left nephrectomy, without local recurrence. Normal adrenal glands. Normal right kidney for age with probable sinus cysts but no hydronephrosis. Cholecystectomy. Proximal gastric underdistention. Extensive colonic diverticulosis. Marked pelvic floor laxity with cystocele. Chronic bladder wall thickening. SKELETON: No abnormal marrow activity. Incidental CT findings: Presumably degenerative bilateral sacroiliac joint sclerosis. IMPRESSION: 1. Status post left nephrectomy. No findings of recurrent or metastatic disease. 2. Incidental findings, including: Coronary artery atherosclerosis. Aortic Atherosclerosis (ICD10-I70.0). Pelvic floor laxity with cystocele. Chronic bladder wall thickening may represent a component of outlet obstruction. Electronically Signed   By: Abigail Miyamoto M.D.   On: 08/29/2020 16:41    ASSESSMENT & PLAN:    80 y.o. 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 PET/CT 06/19/2017 -- consistent with partial metabolic response to treatment.  Rt adrenal gland bx - showed clear cell RCC  S/p CYtoreductive left radical nephrectomy and left adrenal gland resection on 08/01/2016 by Dr Alinda Money.  10/16/17 PET/CT revealed Continued improvement, with the T7 metastatic lesion no longer significantly hypermetabolic. The previous right lower lobe pulmonary nodule is even less apparent, perhaps about 2 mm in diameter today; given that this measured 6 mm on 03/28/2017  this probably represents an effectively treated metastatic lesion. Currently no appreciable hypermetabolic activity is identified to suggest active malignancy. Distended gallbladder with gallbladder wall thickening and gallstones. Correlate clinically in assessing for cholecystitis. Small but abnormal amount of free pelvic fluid, nonspecific. Other imaging findings of potential clinical significance: Chronic ethmoid sinusitis. Aortic Atherosclerosis. Stable 5 mm right middle lobe pulmonary nodule, not hypermetabolic but below sensitive PET-CT size thresholds. Left nephrectomy. Notable pelvic floor laxity with cystocele. Chronic bilateral Sacroiliitis.  04/03/18 PET/CT revealed No evidence for new or progressive hypermetabolic disease on today's study to suggest metastatic progression. 2. Stable appearance of the T7 metastatic lesion without Hypermetabolism. 3. Tiny focus of FDG accumulation identified along the skin of the low right inguinal fold. No associated lesion evident on CT. This may be related to urinary contaminant. 4. Cholelithiasis with similar appearance of diffuse gallbladder wall thickening. 5. Stable 5 mm right middle lobe pulmonary nodule. 6.  Aortic Atherosclerois. 7. Diffuse colonic diverticulosis.   09/18/18 CT A/P revealed "Common duct stent in place. Improvement to resolution in biliary duct dilatation compared to 08/29/2018. Interval cholecystectomy without acute complication. 2. Left nephrectomy, without evidence of metastatic disease. 3.  Tiny hiatal hernia. 4. Coronary artery atherosclerosis. Aortic Atherosclerosis. 5. Mild limitations secondary to lack of IV contrast. 6. Marked pelvic floor laxity with cystocele and rectal prolapse".  07/02/2019 PET/CT ([id number]) which revealed "1. 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  #3 Small pulmonary lesions -- 10/16/17 PET/CT  showed pulmonary less apparent than 03/28/17 PET/CT, decreasing from 60mm to 44mm diameter.   MRI brain shows no evidence of metastatic disease  #4 T7/8 Bone metastases - received Xgeva every 4 weeks from May 2018 to June 2019. 10/16/17 PET/CT revealed improvement with T7 metastatic lesion no longer significantly hypermetabolic.  -on Marchelle Folks  #5 ?liver mets- rpt PET/CT from 10/16/2017 shows no overt evidence of metastatic disease in the liver.  #6 Grade 1 Nausea - improved and intermittent. Hasnt used her anti-emetic as instructed so some nausea and decreased po intake.  #7 Grade 1 Diarrhea - resolved  #8 Hyponatremia -  resolved with sodium at 136 - likely related to some element of adrenal insufficiency, diarrhea, limited by mouth intake. Primarily solute free fluid intake.   #9 Acute on chronic renal insufficiency Creatine stable 1.44  #10 Hyperkalemia due to ACEI + RF- resolved  #11 Hemorrhoids -chronic with some bleeding --Recommended Sitz bath and OTC Anusol or Nupercaine for her hemorrhoid relief. -f/u with PCP for continued mx   #12 Moderate protein calorie malnutrition Weight has stabilized and improved Wt Readings from Last 3 Encounters:  09/13/20 127 lb 11.2 oz (57.9 kg)  08/30/20 127 lb (57.6 kg)  08/16/20 126 lb 8 oz (57.4 kg)   Plan: Continue healthy po intake/diabetic diet -Previously recommended the patient to drink atleast 48-64 oz of fluids daily -f/u with PCP/Cardiology for diuretics management  #13 Hypothyroidism/Adrenal insufficiency/Diabetes -Continue being followed by Dr. Buddy Duty  #14 HTN - ?control. Patient tends to be anxious and has higher blood pressures in the clinic. She can have increased blood pressure from [place] as well. Plan: -continued close f/u with her PCP /cardiology regarding the many elements necessary for her care that are not directly related to her oncology care. -ACEI held due to AKI and hyperkalemia - following with cardiology to  mx this. Has been started on Amlodipine instead.  #15 Grade 1 mucositis- resolved -Advised the patient to continue use of Magic Mouthwash to aid with nutrition  #16 CAD - positive cardiac nuclear stress test Following with cardiology and nephrology   #17 h/o Choledocholithiasis and cholelithiasis 07/10/18 US Abdomen revealed Biliary duct dilatation with 8 mm calculus at the level of the ampulla in the distal common bile duct. 2. Cholelithiasis. No gallbladder wall thickening or pericholecystic fluid. 3. Appearance of the liver raises concern for underlying hepatic cirrhosis. No focal liver lesions are demonstrable. 4.  Left kidney absent. 5.  Small cysts in right kidney. 6.  Aortic Atherosclerosis.  S/p ERCP on 07/23/18 with Dr. Valarie Merino Mansouraty  09/02/18 Cytopathology from ERCP was suspicious for malignant cells in bile duct, but was not definitive, other two findings were benign.  PLAN:   -Discussed labwork today, 09/13/2020; counts and chemistries stable. -Discussed pt  PET Skull Base to Thigh on 08/29/2020; stable with no acute findings nor concerns. -Advised pt that given her increased hypertension and diabetes chronically, it is likely to be diffuse and may not be helped by a stent. This would need an arterial bypass surgery and is very high risk from cardiology standpoint. -Advised pt that if they feel the stent can significantly solve the issue and she would potentially lose her leg if not, then this is worth a discussion. But, if she would just get current sores and not be at risk for losing leg nor would stent be significantly helpful, then she needs to have a discussion with them  based on risks and benefits. -Advised pt there are ways to help try and protect her kidney. F/u w Nephrology regarding this. -Advised pt the numbness in arm is most likely due to a pinched nerve in the neck. -Advised pt to f/u w PCP and Vascular Surgery regarding optimization of PAD and any other measures that  can be taken prior to stint or surgery. 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 -No prohibitive toxicities from Nivolumab. We will continue current treatment plan with Nivolumab with same premedications.  -Continue Xgeva every 6 weeks. -Will see back in in 6 weeks with labs.   FOLLOW UP: Please schedule next 3 cycles of nivolumab with port flush and labs. MD visit in 6 weeks   The total time spent in the appointment was 30 minutes and more than 50% was on counseling and direct patient cares.  All of the patient's questions were answered with apparent satisfaction. The patient knows to call the clinic with any problems, questions or concerns, ordering and mx of immunotherapy   Sullivan Lone MD MS AAHIVMS [facility] Hematology/Oncology Physician [place]  (Office):       [phone] (Work cell):  [phone] (Fax):           [phone]  I, Reinaldo Raddle, am acting as scribe for Dr. Sullivan Lone, MD.  .I have reviewed the above documentation for accuracy and completeness, and I agree with the above. Brunetta Genera MD

## 2020-09-13 ENCOUNTER — Inpatient Hospital Stay: Payer: Medicare Other

## 2020-09-13 ENCOUNTER — Inpatient Hospital Stay (HOSPITAL_BASED_OUTPATIENT_CLINIC_OR_DEPARTMENT_OTHER): Payer: Medicare Other | Admitting: Hematology

## 2020-09-13 ENCOUNTER — Other Ambulatory Visit: Payer: Self-pay

## 2020-09-13 VITALS — BP 131/59 | HR 61 | Temp 97.6°F | Resp 17 | Wt 127.7 lb

## 2020-09-13 DIAGNOSIS — C642 Malignant neoplasm of left kidney, except renal pelvis: Secondary | ICD-10-CM

## 2020-09-13 DIAGNOSIS — C7951 Secondary malignant neoplasm of bone: Secondary | ICD-10-CM

## 2020-09-13 DIAGNOSIS — Z95828 Presence of other vascular implants and grafts: Secondary | ICD-10-CM

## 2020-09-13 DIAGNOSIS — Z7189 Other specified counseling: Secondary | ICD-10-CM

## 2020-09-13 DIAGNOSIS — Z5112 Encounter for antineoplastic immunotherapy: Secondary | ICD-10-CM

## 2020-09-13 LAB — CMP (CANCER CENTER ONLY)
ALT: 26 U/L (ref 0–44)
AST: 25 U/L (ref 15–41)
Albumin: 3.6 g/dL (ref 3.5–5.0)
Alkaline Phosphatase: 73 U/L (ref 38–126)
Anion gap: 11 (ref 5–15)
BUN: 40 mg/dL — ABNORMAL HIGH (ref 8–23)
CO2: 17 mmol/L — ABNORMAL LOW (ref 22–32)
Calcium: 9.5 mg/dL (ref 8.9–10.3)
Chloride: 110 mmol/L (ref 98–111)
Creatinine: 1.98 mg/dL — ABNORMAL HIGH (ref 0.44–1.00)
GFR, Estimated: 25 mL/min — ABNORMAL LOW (ref >60)
Glucose, Bld: 144 mg/dL — ABNORMAL HIGH (ref 70–99)
Potassium: 4.2 mmol/L (ref 3.5–5.1)
Sodium: 138 mmol/L (ref 135–145)
Total Bilirubin: 0.5 mg/dL (ref 0.3–1.2)
Total Protein: 6.7 g/dL (ref 6.5–8.1)

## 2020-09-13 LAB — CBC WITH DIFFERENTIAL (CANCER CENTER ONLY)
Abs Immature Granulocytes: 0.09 10*3/uL — ABNORMAL HIGH (ref 0.00–0.07)
Basophils Absolute: 0.1 10*3/uL (ref 0.0–0.1)
Basophils Relative: 1 %
Eosinophils Absolute: 0.2 10*3/uL (ref 0.0–0.5)
Eosinophils Relative: 2 %
HCT: 35.5 % — ABNORMAL LOW (ref 36.0–46.0)
Hemoglobin: 11.1 g/dL — ABNORMAL LOW (ref 12.0–15.0)
Immature Granulocytes: 1 %
Lymphocytes Relative: 14 %
Lymphs Abs: 1.4 10*3/uL (ref 0.7–4.0)
MCH: 26.9 pg (ref 26.0–34.0)
MCHC: 31.3 g/dL (ref 30.0–36.0)
MCV: 86 fL (ref 80.0–100.0)
Monocytes Absolute: 0.9 10*3/uL (ref 0.1–1.0)
Monocytes Relative: 9 %
Neutro Abs: 7.2 10*3/uL (ref 1.7–7.7)
Neutrophils Relative %: 73 %
Platelet Count: 293 10*3/uL (ref 150–400)
RBC: 4.13 MIL/uL (ref 3.87–5.11)
RDW: 15.3 % (ref 11.5–15.5)
WBC Count: 9.8 10*3/uL (ref 4.0–10.5)
nRBC: 0 % (ref 0.0–0.2)

## 2020-09-13 MED ORDER — DENOSUMAB 120 MG/1.7ML ~~LOC~~ SOLN
SUBCUTANEOUS | Status: AC
  Filled 2020-09-13: qty 1.7

## 2020-09-13 MED ORDER — SODIUM CHLORIDE 0.9 % IV SOLN
240.0000 mg | Freq: Once | INTRAVENOUS | Status: AC
  Administered 2020-09-13: 240 mg via INTRAVENOUS
  Filled 2020-09-13: qty 24

## 2020-09-13 MED ORDER — SODIUM CHLORIDE 0.9% FLUSH
10.0000 mL | Freq: Once | INTRAVENOUS | Status: AC
  Administered 2020-09-13: 10 mL
  Filled 2020-09-13: qty 10

## 2020-09-13 MED ORDER — DENOSUMAB 120 MG/1.7ML ~~LOC~~ SOLN
120.0000 mg | Freq: Once | SUBCUTANEOUS | Status: AC
  Administered 2020-09-13: 120 mg via SUBCUTANEOUS

## 2020-09-13 MED ORDER — SODIUM CHLORIDE 0.9 % IV SOLN
INTRAVENOUS | Status: AC
  Filled 2020-09-13 (×2): qty 250

## 2020-09-13 NOTE — Patient Instructions (Signed)
[facility] Discharge Instructions for Patients Receiving Chemotherapy  Today you received the following immunotherapy agent: Nivolumab (Opdivo)  To help prevent nausea and vomiting after your treatment, we encourage you to take your nausea medication as directed by your MD.   If you develop nausea and vomiting that is not controlled by your nausea medication, call the clinic.   BELOW ARE SYMPTOMS THAT SHOULD BE REPORTED IMMEDIATELY:  *FEVER GREATER THAN 100.5 F  *CHILLS WITH OR WITHOUT FEVER  NAUSEA AND VOMITING THAT IS NOT CONTROLLED WITH YOUR NAUSEA MEDICATION  *UNUSUAL SHORTNESS OF BREATH  *UNUSUAL BRUISING OR BLEEDING  TENDERNESS IN MOUTH AND THROAT WITH OR WITHOUT PRESENCE OF ULCERS  *URINARY PROBLEMS  *BOWEL PROBLEMS  UNUSUAL RASH Items with * indicate a potential emergency and should be followed up as soon as possible.  Feel free to call the clinic should you have any questions or concerns. The clinic phone number is [phone].  Please show the CHEMO ALERT CARD at check-in to the Emergency Department and triage nurse.  Denosumab injection What is this medicine? DENOSUMAB (den oh sue mab) slows bone breakdown. Prolia is used to treat osteoporosis in women after menopause and in men, and in people who are taking corticosteroids for 6 months or more. Xgeva is used to treat a high calcium level due to cancer and to prevent bone fractures and other bone problems caused by multiple myeloma or cancer bone metastases. Xgeva is also used to treat giant cell tumor of the bone. This medicine may be used for other purposes; ask your health care provider or pharmacist if you have questions. COMMON BRAND NAME(S): Prolia, XGEVA What should I tell my health care provider before I take this medicine? They need to know if you have any of these conditions:  dental disease  having surgery or tooth extraction  infection  kidney disease  low levels of calcium or  Vitamin D in the blood  malnutrition  on hemodialysis  skin conditions or sensitivity  thyroid or parathyroid disease  an unusual reaction to denosumab, other medicines, foods, dyes, or preservatives  pregnant or trying to get pregnant  breast-feeding How should I use this medicine? This medicine is for injection under the skin. It is given by a health care professional in a hospital or clinic setting. A special MedGuide will be given to you before each treatment. Be sure to read this information carefully each time. For Prolia, talk to your pediatrician regarding the use of this medicine in children. Special care may be needed. For Xgeva, talk to your pediatrician regarding the use of this medicine in children. While this drug may be prescribed for children as young as 13 years for selected conditions, precautions do apply. Overdosage: If you think you have taken too much of this medicine contact a poison control center or emergency room at once. NOTE: This medicine is only for you. Do not share this medicine with others. What if I miss a dose? It is important not to miss your dose. Call your doctor or health care professional if you are unable to keep an appointment. What may interact with this medicine? Do not take this medicine with any of the following medications:  other medicines containing denosumab This medicine may also interact with the following medications:  medicines that lower your chance of fighting infection  steroid medicines like prednisone or cortisone This list may not describe all possible interactions. Give your health care provider a list of all   the medicines, herbs, non-prescription drugs, or dietary supplements you use. Also tell them if you smoke, drink alcohol, or use illegal drugs. Some items may interact with your medicine. What should I watch for while using this medicine? Visit your doctor or health care professional for regular checks on your  progress. Your doctor or health care professional may order blood tests and other tests to see how you are doing. Call your doctor or health care professional for advice if you get a fever, chills or sore throat, or other symptoms of a cold or flu. Do not treat yourself. This drug may decrease your body's ability to fight infection. Try to avoid being around people who are sick. You should make sure you get enough calcium and vitamin D while you are taking this medicine, unless your doctor tells you not to. Discuss the foods you eat and the vitamins you take with your health care professional. See your dentist regularly. Brush and floss your teeth as directed. Before you have any dental work done, tell your dentist you are receiving this medicine. Do not become pregnant while taking this medicine or for 5 months after stopping it. Talk with your doctor or health care professional about your birth control options while taking this medicine. Women should inform their doctor if they wish to become pregnant or think they might be pregnant. There is a potential for serious side effects to an unborn child. Talk to your health care professional or pharmacist for more information. What side effects may I notice from receiving this medicine? Side effects that you should report to your doctor or health care professional as soon as possible:  allergic reactions like skin rash, itching or hives, swelling of the face, lips, or tongue  bone pain  breathing problems  dizziness  jaw pain, especially after dental work  redness, blistering, peeling of the skin  signs and symptoms of infection like fever or chills; cough; sore throat; pain or trouble passing urine  signs of low calcium like fast heartbeat, muscle cramps or muscle pain; pain, tingling, numbness in the hands or feet; seizures  unusual bleeding or bruising  unusually weak or tired Side effects that usually do not require medical attention  (report to your doctor or health care professional if they continue or are bothersome):  constipation  diarrhea  headache  joint pain  loss of appetite  muscle pain  runny nose  tiredness  upset stomach This list may not describe all possible side effects. Call your doctor for medical advice about side effects. You may report side effects to FDA at 1-800-FDA-1088. Where should I keep my medicine? This medicine is only given in a clinic, doctor's office, or other health care setting and will not be stored at home. NOTE: This sheet is a summary. It may not cover all possible information. If you have questions about this medicine, talk to your doctor, pharmacist, or health care provider.  2021 [employer]vier/Gold Standard (2017-08-15 16:10:44)

## 2020-09-13 NOTE — Patient Instructions (Signed)

## 2020-09-13 NOTE — Progress Notes (Signed)
Ok to treat per [name] to treat with elevated Scr.

## 2020-09-14 ENCOUNTER — Telehealth: Payer: Self-pay | Admitting: Hematology

## 2020-09-14 NOTE — Telephone Encounter (Signed)
Scheduled follow-up appointments per 5/25 los. Patient is aware.

## 2020-09-19 ENCOUNTER — Encounter: Payer: Self-pay | Admitting: Hematology

## 2020-09-19 ENCOUNTER — Telehealth: Payer: Self-pay | Admitting: Hematology

## 2020-09-19 NOTE — Telephone Encounter (Signed)
Left message with follow-up appointments per 5/25 los. Gave option to call back to reschedule if needed. 

## 2020-09-26 ENCOUNTER — Other Ambulatory Visit: Payer: Self-pay

## 2020-09-26 DIAGNOSIS — C7951 Secondary malignant neoplasm of bone: Secondary | ICD-10-CM

## 2020-09-27 ENCOUNTER — Inpatient Hospital Stay: Payer: Medicare Other

## 2020-09-27 ENCOUNTER — Inpatient Hospital Stay: Payer: Medicare Other | Attending: Hematology

## 2020-09-27 ENCOUNTER — Other Ambulatory Visit: Payer: Self-pay

## 2020-09-27 VITALS — BP 121/60 | HR 57 | Temp 98.0°F | Resp 16

## 2020-09-27 DIAGNOSIS — C7951 Secondary malignant neoplasm of bone: Secondary | ICD-10-CM | POA: Diagnosis not present

## 2020-09-27 DIAGNOSIS — C642 Malignant neoplasm of left kidney, except renal pelvis: Secondary | ICD-10-CM | POA: Diagnosis not present

## 2020-09-27 DIAGNOSIS — Z7189 Other specified counseling: Secondary | ICD-10-CM

## 2020-09-27 DIAGNOSIS — Z95828 Presence of other vascular implants and grafts: Secondary | ICD-10-CM

## 2020-09-27 DIAGNOSIS — Z5112 Encounter for antineoplastic immunotherapy: Secondary | ICD-10-CM | POA: Diagnosis not present

## 2020-09-27 LAB — CBC WITH DIFFERENTIAL (CANCER CENTER ONLY)
Abs Immature Granulocytes: 0.1 10*3/uL — ABNORMAL HIGH (ref 0.00–0.07)
Basophils Absolute: 0.1 10*3/uL (ref 0.0–0.1)
Basophils Relative: 1 %
Eosinophils Absolute: 0.2 10*3/uL (ref 0.0–0.5)
Eosinophils Relative: 2 %
HCT: 34.1 % — ABNORMAL LOW (ref 36.0–46.0)
Hemoglobin: 10.9 g/dL — ABNORMAL LOW (ref 12.0–15.0)
Immature Granulocytes: 1 %
Lymphocytes Relative: 15 %
Lymphs Abs: 1.3 10*3/uL (ref 0.7–4.0)
MCH: 26.7 pg (ref 26.0–34.0)
MCHC: 32 g/dL (ref 30.0–36.0)
MCV: 83.4 fL (ref 80.0–100.0)
Monocytes Absolute: 1 10*3/uL (ref 0.1–1.0)
Monocytes Relative: 11 %
Neutro Abs: 6.3 10*3/uL (ref 1.7–7.7)
Neutrophils Relative %: 70 %
Platelet Count: 283 10*3/uL (ref 150–400)
RBC: 4.09 MIL/uL (ref 3.87–5.11)
RDW: 15.2 % (ref 11.5–15.5)
WBC Count: 9 10*3/uL (ref 4.0–10.5)
nRBC: 0 % (ref 0.0–0.2)

## 2020-09-27 LAB — CMP (CANCER CENTER ONLY)
ALT: 24 U/L (ref 0–44)
AST: 20 U/L (ref 15–41)
Albumin: 3.5 g/dL (ref 3.5–5.0)
Alkaline Phosphatase: 73 U/L (ref 38–126)
Anion gap: 13 (ref 5–15)
BUN: 35 mg/dL — ABNORMAL HIGH (ref 8–23)
CO2: 16 mmol/L — ABNORMAL LOW (ref 22–32)
Calcium: 9.5 mg/dL (ref 8.9–10.3)
Chloride: 111 mmol/L (ref 98–111)
Creatinine: 2.29 mg/dL — ABNORMAL HIGH (ref 0.44–1.00)
GFR, Estimated: 21 mL/min — ABNORMAL LOW (ref >60)
Glucose, Bld: 169 mg/dL — ABNORMAL HIGH (ref 70–99)
Potassium: 4.1 mmol/L (ref 3.5–5.1)
Sodium: 140 mmol/L (ref 135–145)
Total Bilirubin: 0.3 mg/dL (ref 0.3–1.2)
Total Protein: 6.6 g/dL (ref 6.5–8.1)

## 2020-09-27 MED ORDER — SODIUM CHLORIDE 0.9 % IV SOLN
INTRAVENOUS | Status: AC
  Filled 2020-09-27 (×2): qty 250

## 2020-09-27 MED ORDER — HEPARIN SOD (PORK) LOCK FLUSH 100 UNIT/ML IV SOLN
500.0000 [IU] | Freq: Once | INTRAVENOUS | Status: AC | PRN
  Administered 2020-09-27: 500 [IU]
  Filled 2020-09-27: qty 5

## 2020-09-27 MED ORDER — SODIUM CHLORIDE 0.9 % IV SOLN
240.0000 mg | Freq: Once | INTRAVENOUS | Status: AC
  Administered 2020-09-27: 240 mg via INTRAVENOUS
  Filled 2020-09-27: qty 24

## 2020-09-27 MED ORDER — SODIUM CHLORIDE 0.9% FLUSH
10.0000 mL | INTRAVENOUS | Status: DC | PRN
  Administered 2020-09-27: 10 mL
  Filled 2020-09-27: qty 10

## 2020-09-27 MED ORDER — SODIUM CHLORIDE 0.9% FLUSH
10.0000 mL | Freq: Once | INTRAVENOUS | Status: AC
  Administered 2020-09-27: 10 mL
  Filled 2020-09-27: qty 10

## 2020-09-27 NOTE — Progress Notes (Signed)
Per Dr. Lorenso Courier (in Dr. Grier Mitts absence), ok to treat with elevated creatinine.

## 2020-09-27 NOTE — Patient Instructions (Signed)
[facility] MEDICAL ONCOLOGY  ? Discharge Instructions: ?Thank you for choosing [facility] to provide your oncology and hematology care.  ? ?If you have a lab appointment with the Cancer Center, please go directly to the Cancer Center and check in at the registration area. ?  ?Wear comfortable clothing and clothing appropriate for easy access to any Portacath or PICC line.  ? ?We strive to give you quality time with your provider. You may need to reschedule your appointment if you arrive late (15 or more minutes).  Arriving late affects you and other patients whose appointments are after yours.  Also, if you miss three or more appointments without notifying the office, you may be dismissed from the clinic at the provider?s discretion.    ?  ?For prescription refill requests, have your pharmacy contact our office and allow 72 hours for refills to be completed.   ? ?Today you received the following chemotherapy and/or immunotherapy agents: nivolumab    ?  ?To help prevent nausea and vomiting after your treatment, we encourage you to take your nausea medication as directed. ? ?BELOW ARE SYMPTOMS THAT SHOULD BE REPORTED IMMEDIATELY: ?*FEVER GREATER THAN 100.4 F (38 ?C) OR HIGHER ?*CHILLS OR SWEATING ?*NAUSEA AND VOMITING THAT IS NOT CONTROLLED WITH YOUR NAUSEA MEDICATION ?*UNUSUAL SHORTNESS OF BREATH ?*UNUSUAL BRUISING OR BLEEDING ?*URINARY PROBLEMS (pain or burning when urinating, or frequent urination) ?*BOWEL PROBLEMS (unusual diarrhea, constipation, pain near the anus) ?TENDERNESS IN MOUTH AND THROAT WITH OR WITHOUT PRESENCE OF ULCERS (sore throat, sores in mouth, or a toothache) ?UNUSUAL RASH, SWELLING OR PAIN  ?UNUSUAL VAGINAL DISCHARGE OR ITCHING  ? ?Items with * indicate a potential emergency and should be followed up as soon as possible or go to the Emergency Department if any problems should occur. ? ?Please show the CHEMOTHERAPY ALERT CARD or IMMUNOTHERAPY ALERT CARD at check-in  to the Emergency Department and triage nurse. ? ?Should you have questions after your visit or need to cancel or reschedule your appointment, please contact [facility]ICAL [facility]COL[facility]  Dept: [phone]  and follow the prompts.  Office hours are 8:00 a.m. to 4:30 p.m. Monday - Friday. Please note that voicemails left after 4:00 p.m. may not be returned until the following business day.  We are closed weekends and major holidays. You have access to a nurse at all times for urgent questions. Please call the main number to the clinic Dept: [phone] and follow the prompts. ? ? ?For any non-urgent questions, you may also contact your provider using MyChart. We now offer e-Visits for anyone 18 and older to request care online for non-urgent symptoms. For details visit [email or web address]. ?  ?Also download the MyChart app! Go to the app store, search "MyChart", open the app, select [facility], and log in with your MyChart username and password. ? ?Due to Covid, a mask is required upon entering the hospital/clinic. If you do not have a mask, one will be given to you upon arrival. For doctor visits, patients may have 1 support person aged 18 or older with them. For treatment visits, patients cannot have anyone with them due to current Covid guidelines and our immunocompromised population.  ? ?

## 2020-10-10 ENCOUNTER — Other Ambulatory Visit: Payer: Self-pay

## 2020-10-10 DIAGNOSIS — C7951 Secondary malignant neoplasm of bone: Secondary | ICD-10-CM

## 2020-10-11 ENCOUNTER — Inpatient Hospital Stay: Payer: Medicare Other

## 2020-10-11 ENCOUNTER — Other Ambulatory Visit: Payer: Self-pay

## 2020-10-11 VITALS — BP 139/52 | HR 65 | Temp 97.8°F | Resp 17 | Wt 127.5 lb

## 2020-10-11 DIAGNOSIS — C642 Malignant neoplasm of left kidney, except renal pelvis: Secondary | ICD-10-CM

## 2020-10-11 DIAGNOSIS — Z5112 Encounter for antineoplastic immunotherapy: Secondary | ICD-10-CM | POA: Diagnosis not present

## 2020-10-11 DIAGNOSIS — C7951 Secondary malignant neoplasm of bone: Secondary | ICD-10-CM

## 2020-10-11 DIAGNOSIS — Z95828 Presence of other vascular implants and grafts: Secondary | ICD-10-CM

## 2020-10-11 DIAGNOSIS — Z7189 Other specified counseling: Secondary | ICD-10-CM

## 2020-10-11 LAB — CMP (CANCER CENTER ONLY)
ALT: 27 U/L (ref 0–44)
AST: 23 U/L (ref 15–41)
Albumin: 3.4 g/dL — ABNORMAL LOW (ref 3.5–5.0)
Alkaline Phosphatase: 68 U/L (ref 38–126)
Anion gap: 9 (ref 5–15)
BUN: 32 mg/dL — ABNORMAL HIGH (ref 8–23)
CO2: 17 mmol/L — ABNORMAL LOW (ref 22–32)
Calcium: 9.4 mg/dL (ref 8.9–10.3)
Chloride: 113 mmol/L — ABNORMAL HIGH (ref 98–111)
Creatinine: 2.45 mg/dL — ABNORMAL HIGH (ref 0.44–1.00)
GFR, Estimated: 20 mL/min — ABNORMAL LOW (ref >60)
Glucose, Bld: 160 mg/dL — ABNORMAL HIGH (ref 70–99)
Potassium: 4.2 mmol/L (ref 3.5–5.1)
Sodium: 139 mmol/L (ref 135–145)
Total Bilirubin: 0.4 mg/dL (ref 0.3–1.2)
Total Protein: 6.5 g/dL (ref 6.5–8.1)

## 2020-10-11 LAB — CBC WITH DIFFERENTIAL (CANCER CENTER ONLY)
Abs Immature Granulocytes: 0.15 10*3/uL — ABNORMAL HIGH (ref 0.00–0.07)
Basophils Absolute: 0.1 10*3/uL (ref 0.0–0.1)
Basophils Relative: 1 %
Eosinophils Absolute: 0.2 10*3/uL (ref 0.0–0.5)
Eosinophils Relative: 2 %
HCT: 35.4 % — ABNORMAL LOW (ref 36.0–46.0)
Hemoglobin: 11 g/dL — ABNORMAL LOW (ref 12.0–15.0)
Immature Granulocytes: 2 %
Lymphocytes Relative: 18 %
Lymphs Abs: 1.5 10*3/uL (ref 0.7–4.0)
MCH: 26.1 pg (ref 26.0–34.0)
MCHC: 31.1 g/dL (ref 30.0–36.0)
MCV: 83.9 fL (ref 80.0–100.0)
Monocytes Absolute: 0.9 10*3/uL (ref 0.1–1.0)
Monocytes Relative: 11 %
Neutro Abs: 5.6 10*3/uL (ref 1.7–7.7)
Neutrophils Relative %: 66 %
Platelet Count: 304 10*3/uL (ref 150–400)
RBC: 4.22 MIL/uL (ref 3.87–5.11)
RDW: 15.4 % (ref 11.5–15.5)
WBC Count: 8.4 10*3/uL (ref 4.0–10.5)
nRBC: 0 % (ref 0.0–0.2)

## 2020-10-11 MED ORDER — SODIUM CHLORIDE 0.9% FLUSH
10.0000 mL | INTRAVENOUS | Status: DC | PRN
  Administered 2020-10-11: 10 mL
  Filled 2020-10-11: qty 10

## 2020-10-11 MED ORDER — SODIUM CHLORIDE 0.9 % IV SOLN
240.0000 mg | Freq: Once | INTRAVENOUS | Status: AC
  Administered 2020-10-11: 240 mg via INTRAVENOUS
  Filled 2020-10-11: qty 24

## 2020-10-11 MED ORDER — SODIUM CHLORIDE 0.9 % IV SOLN
INTRAVENOUS | Status: AC
  Filled 2020-10-11 (×2): qty 250

## 2020-10-11 MED ORDER — SODIUM CHLORIDE 0.9% FLUSH
10.0000 mL | Freq: Once | INTRAVENOUS | Status: AC
  Administered 2020-10-11: 10 mL
  Filled 2020-10-11: qty 10

## 2020-10-11 MED ORDER — HEPARIN SOD (PORK) LOCK FLUSH 100 UNIT/ML IV SOLN
500.0000 [IU] | Freq: Once | INTRAVENOUS | Status: AC | PRN
  Administered 2020-10-11: 500 [IU]
  Filled 2020-10-11: qty 5

## 2020-10-11 NOTE — Progress Notes (Signed)
okay to treat with elevated creatnine per Dr. Lorenso Courier

## 2020-10-24 NOTE — Progress Notes (Signed)
HEMATOLOGY/ONCOLOGY CLINIC NOTE  Date of Service: 10/25/20    PCP: Kristine Linea MD ([place]) Endocrinolgy - Dr Buddy Duty GI- Dr Bubba Camp  CHIEF COMPLAINTS/PURPOSE OF CONSULTATION:   F/u for continued mx of metastatic renal cell carcinoma  HISTORY OF PRESENTING ILLNESS:  plz see previous note for details of HPI   INTERVAL HISTORY:   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 She is here for next cycle of Nivolumab. The patient's last visit with [name] was on 09/13/2020. The pt reports that she is doing well overall.  The pt reports that she has been doing well. She notes that she has been gaining weight. There is nothing planned from vascular surgery at this time. She notes some color change in her toes and continued cramping that is worse. She is currently not on any blood thinners. She was on ASA but has not been on it since.  Lab results today 10/25/2020 of CBC w/diff and CMP is as follows: all values are WNL except for Hgb of 10.6, HCT of 34.2, Chloride of 112, CO2 of 20, Glucose of 142, BUN of 29, Creatinine of 1.92, Albumin of 3.3, GFR est of 26.  On review of systems, pt reports continued foot cramping and peripheral arterial disease and denies abdominal pain, acute SOB, acute back pain, and any other symptoms.   MEDICAL HISTORY:   Past Medical History:  Diagnosis Date   Anemia    Arthritis    lower back, hips, hands   Biliary stricture    Diabetes mellitus ([place])    type 2    Early cataracts, bilateral    Md just watching   Elevated liver enzymes    Family history of adverse reaction to anesthesia    Daughter hard to wake up   Gallstones    GERD (gastroesophageal reflux disease)    occasional - diet controlled   History of blood transfusion 2018   History of hiatal hernia    HTN (hypertension)    Hyperlipidemia    Hypothyroidism    left renal ca  dx'd 2018   renal cancer - left kidney removed, pill chemo x 1 yr   Myocardial infarction ([place]) 1991   no deficits   SVD (spontaneous vaginal delivery)    x 3   Wears glasses    Dyslipidemia Osteoarthritis Ex-smoker Coronary artery disease Thyroid disorder-was apparently on levothyroxine 25 g daily which has subsequently been discontinued . Mitral regurgitation B12 deficiency  hiatal hernia with esophagitis   SURGICAL HISTORY: Past Surgical History:  Procedure Laterality Date   BALLOON DILATION N/A 07/23/2018   Procedure: BALLOON DILATION;  Surgeon: Mansouraty, Telford Nab., MD;  Location: [place];  Service: Gastroenterology;  Laterality: N/A;   BILIARY BRUSHING  08/10/2018   Procedure: BILIARY BRUSHING;  Surgeon: Rush Landmark Telford Nab., MD;  Location: [place];  Service: Gastroenterology;;   BILIARY BRUSHING  11/30/2018   Procedure: BILIARY BRUSHING;  Surgeon: Irving Copas., MD;  Location: [place];  Service: Gastroenterology;;   BILIARY DILATION  08/10/2018   Procedure: BILIARY DILATION;  Surgeon: Irving Copas., MD;  Location: [place];  Service: Gastroenterology;;   BILIARY DILATION  08/27/2018   Procedure: BILIARY DILATION;  Surgeon: Irving Copas., MD;  Location: [place];  Service: Gastroenterology;;   BILIARY DILATION  01/24/2020   Procedure: BILIARY DILATION;  Surgeon: Irving Copas., MD;  Location: [facility]  ENDOSCOPY;  Service: Gastroenterology;;   BILIARY STENT PLACEMENT  08/10/2018   Procedure: BILIARY STENT PLACEMENT;  Surgeon: Rush Landmark Telford Nab., MD;  Location: [place];  Service: Gastroenterology;;   BILIARY STENT PLACEMENT  08/27/2018   Procedure: BILIARY STENT PLACEMENT;  Surgeon: Irving Copas., MD;  Location: [place];  Service: Gastroenterology;;   BILIARY STENT PLACEMENT  11/30/2018   Procedure: BILIARY STENT PLACEMENT;  Surgeon: Irving Copas., MD;  Location: [place];  Service:  Gastroenterology;;   BIOPSY  07/23/2018   Procedure: BIOPSY;  Surgeon: Irving Copas., MD;  Location: [place];  Service: Gastroenterology;;   BIOPSY  01/24/2020   Procedure: BIOPSY;  Surgeon: Irving Copas., MD;  Location: [place];  Service: Gastroenterology;;   CHOLECYSTECTOMY N/A 09/02/2018   Procedure: LAPAROSCOPIC CHOLECYSTECTOMY;  Surgeon: Stark Klein, MD;  Location: [place];  Service: General;  Laterality: N/A;   COLONOSCOPY     normal    ENDOSCOPIC RETROGRADE CHOLANGIOPANCREATOGRAPHY (ERCP) WITH PROPOFOL N/A 08/10/2018   Procedure: ENDOSCOPIC RETROGRADE CHOLANGIOPANCREATOGRAPHY (ERCP) WITH PROPOFOL;  Surgeon: Irving Copas., MD;  Location: [place];  Service: Gastroenterology;  Laterality: N/A;   ENDOSCOPIC RETROGRADE CHOLANGIOPANCREATOGRAPHY (ERCP) WITH PROPOFOL N/A 11/30/2018   Procedure: ENDOSCOPIC RETROGRADE CHOLANGIOPANCREATOGRAPHY (ERCP) WITH PROPOFOL;  Surgeon: Rush Landmark Telford Nab., MD;  Location: [place];  Service: Gastroenterology;  Laterality: N/A;   ENDOSCOPIC RETROGRADE CHOLANGIOPANCREATOGRAPHY (ERCP) WITH PROPOFOL N/A 01/24/2020   Procedure: ENDOSCOPIC RETROGRADE CHOLANGIOPANCREATOGRAPHY (ERCP) WITH PROPOFOL;  Surgeon: Rush Landmark Telford Nab., MD;  Location: [place];  Service: Gastroenterology;  Laterality: N/A;   ERCP N/A 07/23/2018   Procedure: ENDOSCOPIC RETROGRADE CHOLANGIOPANCREATOGRAPHY (ERCP);  Surgeon: Irving Copas., MD;  Location: [place];  Service: Gastroenterology;  Laterality: N/A;   ERCP N/A 08/27/2018   Procedure: ENDOSCOPIC RETROGRADE CHOLANGIOPANCREATOGRAPHY (ERCP);  Surgeon: Irving Copas., MD;  Location: [place];  Service: Gastroenterology;  Laterality: N/A;   ESOPHAGOGASTRODUODENOSCOPY N/A 01/24/2020   Procedure: ESOPHAGOGASTRODUODENOSCOPY (EGD);  Surgeon: Irving Copas., MD;  Location: [place];  Service: Gastroenterology;  Laterality: N/A;   ESOPHAGOGASTRODUODENOSCOPY  (EGD) WITH PROPOFOL N/A 08/27/2018   Procedure: ESOPHAGOGASTRODUODENOSCOPY (EGD) WITH PROPOFOL;  Surgeon: Rush Landmark Telford Nab., MD;  Location: [place];  Service: Gastroenterology;  Laterality: N/A;   EUS N/A 08/27/2018   Procedure: ESOPHAGEAL ENDOSCOPIC ULTRASOUND (EUS) RADIAL;  Surgeon: Rush Landmark Telford Nab., MD;  Location: [place];  Service: Gastroenterology;  Laterality: N/A;   FINE NEEDLE ASPIRATION  08/27/2018   Procedure: FINE NEEDLE ASPIRATION (FNA) LINEAR;  Surgeon: Irving Copas., MD;  Location: [place];  Service: Gastroenterology;;   Fatima Blank HERNIA REPAIR N/A 06/23/2020   Procedure: [place];  Surgeon: Felicie Morn, MD;  Location: [facility];  Service: General;  Laterality: N/A;  ROOM 2 STARTING AT 11:00AM FOR 90 MIN   IR IMAGING GUIDED PORT INSERTION  01/08/2018   LAPAROSCOPIC NEPHRECTOMY Left 08/01/2016   Procedure: LAPAROSCOPIC  RADICAL NEPHRECTOMY/ REPAIR OF UMBILICAL HERNIA;  Surgeon: Raynelle Bring, MD;  Location: [facility];  Service: Urology;  Laterality: Left;   REMOVAL OF STONES  07/23/2018   Procedure: REMOVAL OF GALL STONES;  Surgeon: Rush Landmark Telford Nab., MD;  Location: [place];  Service: Gastroenterology;;   REMOVAL OF STONES  08/10/2018   Procedure: REMOVAL OF STONES;  Surgeon: Irving Copas., MD;  Location: [place];  Service: Gastroenterology;;   REMOVAL OF STONES  08/27/2018   Procedure: REMOVAL OF STONES;  Surgeon: Irving Copas., MD;  Location: [place];  Service: Gastroenterology;;  REMOVAL OF STONES  01/24/2020   Procedure: REMOVAL OF STONES;  Surgeon: Rush Landmark Telford Nab., MD;  Location: [place];  Service: Gastroenterology;;   [name]  07/23/2018   Procedure: [name];  Surgeon: Mansouraty, Telford Nab., MD;  Location: [place];  Service: Gastroenterology;;   Lavell Islam REMOVAL  08/27/2018   Procedure: STENT REMOVAL;  Surgeon: Irving Copas., MD;  Location:  [place];  Service: Gastroenterology;;   Lavell Islam REMOVAL  11/30/2018   Procedure: STENT REMOVAL;  Surgeon: Irving Copas., MD;  Location: [place];  Service: Gastroenterology;;   Lavell Islam REMOVAL  01/24/2020   Procedure: STENT REMOVAL;  Surgeon: Irving Copas., MD;  Location: [place];  Service: Gastroenterology;;   UPPER GI ENDOSCOPY     x 1   VAGINAL PROLAPSE REPAIR  11/18/2019   [facility]   No reported past surgeries EGD 05/01/2016 Dr. Earley Brooke Colonoscopy 03/2016 Dr. Earley Brooke  SOCIAL HISTORY: Social History   Socioeconomic History   Marital status: Married    Spouse name: Not on file   Number of children: 3   Years of education: Not on file   Highest education level: Not on file  Occupational History   Occupation: retired  Tobacco Use   Smoking status: Former    Packs/day: 1.00    Years: 8.00    Pack years: 8.00    Types: Cigarettes    Quit date: 06/18/1964    Years since quitting: 56.3   Smokeless tobacco: Never  Vaping Use   Vaping Use: Never used  Substance and Sexual Activity   Alcohol use: No   Drug use: No   Sexual activity: Not Currently    Birth control/protection: Post-menopausal  Other Topics Concern   Not on file  Social History Narrative   Married.  Three children   Social Determinants of Health   Financial Resource Strain: Not on file  Food Insecurity: Not on file  Transportation Needs: Not on file  Physical Activity: Not on file  Stress: Not on file  Social Connections: Not on file  Intimate Partner Violence: Not on file   Patient lives in [place] Works as a Solicitor part-time Ex-smoker quit long time ago In 1961 . Smoked 2 packs per day for 8 years prior to that .  or a lot of stress since her daughter is also getting treated for stage IV uterine cancer.  FAMILY HISTORY:  Mother deceased Father with asthma and emphysema died at 90 years with the MI. Brother deceased of heart disease   ALLERGIES:   is allergic to doxycycline, adhesive [tape], nsaids, and statins.  MEDICATIONS:  Current Outpatient Medications  Medication Sig Dispense Refill   amLODipine (NORVASC) 5 MG tablet Take 5 mg by mouth daily.   6   atorvastatin (LIPITOR) 80 MG tablet Take 80 mg by mouth at bedtime.      carvedilol (COREG) 6.25 MG tablet Take 6.25 mg by mouth 2 (two) times daily.  6   clobetasol ointment (TEMOVATE) 1.30 % Apply 1 application topically 2 (two) times daily as needed (lichen sclerosis).      dapagliflozin propanediol (FARXIGA) 10 MG TABS tablet      Denosumab (XGEVA [place]) Inject into the skin every 6 (six) weeks.     diphenhydrAMINE (BENADRYL) 25 MG tablet Take 25 mg by mouth at bedtime.     glimepiride (AMARYL) 1 MG tablet Take 1 mg by mouth daily with breakfast.     hydrocortisone (CORTEF) 10 MG tablet Take 5-10 mg  by mouth See admin instructions. Take 1 tablet (10 mg) by mouth in the morning & 1/2 tablet (5 mg) by mouth in the evening.     levothyroxine (SYNTHROID) 75 MCG tablet Take 75 mcg by mouth daily before breakfast.      lidocaine (LIDODERM) 5 % Place 1 patch onto the skin daily as needed. Remove & Discard patch within 12 hours or as directed by MD     lidocaine-prilocaine (EMLA) cream Apply 1 application topically daily as needed (prior to port access). 30 g 1   Nivolumab (OPDIVO IV) Inject 1 Dose into the vein every 14 (fourteen) days. [place]     omeprazole (PRILOSEC) 20 MG capsule TAKE 1 CAPSULE BY MOUTH TWICE DAILY BEFORE A MEAL(S) 60 capsule 3   SSD 1 % cream Apply topically 2 (two) times daily.     No current facility-administered medications for this visit.   Facility-Administered Medications Ordered in Other Visits  Medication Dose Route Frequency Provider Last Rate Last Admin   0.9 %  sodium chloride infusion   Intravenous Continuous Brunetta Genera, MD 500 mL/hr at 10/25/20 0943 New Bag at 10/25/20 0943   denosumab (XGEVA) injection 120 mg  120 mg Subcutaneous Once  Brunetta Genera, MD       heparin lock flush 100 unit/mL  500 Units Intracatheter Once PRN Brunetta Genera, MD       nivolumab (OPDIVO) 240 mg in sodium chloride 0.9 % 100 mL chemo infusion  240 mg Intravenous Once Brunetta Genera, MD       sodium chloride flush (NS) 0.9 % injection 10 mL  10 mL Intracatheter PRN Truitt Merle, MD   10 mL at 11/20/18 1232   sodium chloride flush (NS) 0.9 % injection 10 mL  10 mL Intracatheter PRN Brunetta Genera, MD        REVIEW OF SYSTEMS:   10 Point review of Systems was done is negative except as noted above.  PHYSICAL EXAMINATION:  ECOG FS:2 - Symptomatic, <50% confined to bed  There were no vitals filed for this visit.  Wt Readings from Last 3 Encounters:  10/11/20 127 lb 8 oz (57.8 kg)  09/13/20 127 lb 11.2 oz (57.9 kg)  08/30/20 127 lb (57.6 kg)   There is no height or weight on file to calculate BMI.     Exam was given in infusion.   GENERAL:alert, in no acute distress and comfortable SKIN: no acute rashes, no significant lesions EYES: conjunctiva are pink and non-injected, sclera anicteric OROPHARYNX: MMM, no exudates, no oropharyngeal erythema or ulceration NECK: supple, no JVD LYMPH:  no palpable lymphadenopathy in the cervical, axillary or inguinal regions LUNGS: clear to auscultation b/l with normal respiratory effort HEART: regular rate & rhythm ABDOMEN:  normoactive bowel sounds , non tender, not distended. Extremity: no pedal edema PSYCH: alert & oriented x 3 with fluent speech NEURO: no focal motor/sensory deficits  LABORATORY DATA:  I have reviewed the data as listed  . CBC Latest Ref Rng & Units 10/25/2020 10/11/2020 09/27/2020  WBC 4.0 - 10.5 K/uL 7.2 8.4 9.0  Hemoglobin 12.0 - 15.0 g/dL 10.6(L) 11.0(L) 10.9(L)  Hematocrit 36.0 - 46.0 % 34.2(L) 35.4(L) 34.1(L)  Platelets 150 - 400 K/uL 212 304 283    CBC    Component Value Date/Time   WBC 7.2 10/25/2020 0813   WBC 9.5 07/05/2020 0921   RBC 4.05  10/25/2020 0813   HGB 10.6 (L) 10/25/2020 0813  HGB 11.2 (L) 04/04/2017 0830   HCT 34.2 (L) 10/25/2020 0813   HCT 35.2 04/04/2017 0830   PLT 212 10/25/2020 0813   PLT 250 04/04/2017 0830   MCV 84.4 10/25/2020 0813   MCV 107.6 (H) 04/04/2017 0830   MCH 26.2 10/25/2020 0813   MCHC 31.0 10/25/2020 0813   RDW 15.2 10/25/2020 0813   RDW 14.0 04/04/2017 0830   LYMPHSABS 1.3 10/25/2020 0813   LYMPHSABS 1.6 04/04/2017 0830   MONOABS 0.8 10/25/2020 0813   MONOABS 0.4 04/04/2017 0830   EOSABS 0.2 10/25/2020 0813   EOSABS 0.1 04/04/2017 0830   BASOSABS 0.1 10/25/2020 0813   BASOSABS 0.0 04/04/2017 0830     . CMP Latest Ref Rng & Units 10/25/2020 10/11/2020 09/27/2020  Glucose 70 - 99 mg/dL 142(H) 160(H) 169(H)  BUN 8 - 23 mg/dL 29(H) 32(H) 35(H)  Creatinine 0.44 - 1.00 mg/dL 1.92(H) 2.45(H) 2.29(H)  Sodium 135 - 145 mmol/L 140 139 140  Potassium 3.5 - 5.1 mmol/L 4.6 4.2 4.1  Chloride 98 - 111 mmol/L 112(H) 113(H) 111  CO2 22 - 32 mmol/L 20(L) 17(L) 16(L)  Calcium 8.9 - 10.3 mg/dL 9.4 9.4 9.5  Total Protein 6.5 - 8.1 g/dL 6.5 6.5 6.6  Total Bilirubin 0.3 - 1.2 mg/dL 0.4 0.4 0.3  Alkaline Phos 38 - 126 U/L 63 68 73  AST 15 - 41 U/L 27 23 20   ALT 0 - 44 U/L 23 27 24    B12 level 299 (OSH)-->455  . Lab Results  Component Value Date   LDH 117 12/22/2019        08/27/18 Surgical Pathology from Cholecystectomy:         RADIOGRAPHIC STUDIES: I have personally reviewed the radiological images as listed and agreed with the findings in the report. Nm Pet Image Restag (ps) Skull Base To Thigh  Result Date: 01/03/2017 IMPRESSION: 1. There is a new lesion in the hepatic dome which is FDG avid and low in attenuation on CT imaging consistent with metastatic disease. Another region of uptake in the left hepatic lobe posteriorly demonstrates no CT correlate. A second subtle metastasis is not excluded on today's study. An MRI could better assess for other hepatic metastases if clinically  warranted. 2. New metastatic lesion in the left side of T8. 3. New pulmonary nodule in the right upper lobe. This is too small to characterize but suspicious. Recommend attention on follow-up. No FDG avid nodules or other enlarging nodules. 4. The uptake at the previous left renal artery has almost resolved in the interval and is favored to be post therapeutic. Recommend attention on follow-up. 5. The metastasis in the posterior right hilum seen previously has resolved. Electronically Signed   By: Dorise Bullion III M.D   On: 01/03/2017 11:16   .No results found.   ASSESSMENT & PLAN:    80 y.o. 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 PET/CT 06/19/2017 -- consistent with partial metabolic response to treatment.  Rt adrenal gland bx - showed clear cell RCC  S/p CYtoreductive left radical nephrectomy and left adrenal gland resection on 08/01/2016 by Dr Alinda Money.  10/16/17 PET/CT revealed Continued improvement, with the T7 metastatic lesion no longer significantly hypermetabolic. The previous right lower lobe pulmonary nodule is even less apparent, perhaps about 2 mm in diameter today; given that this measured 6 mm on 03/28/2017 this probably represents an effectively treated  metastatic lesion. Currently no appreciable hypermetabolic activity is identified to suggest active malignancy. Distended gallbladder with gallbladder wall thickening and gallstones. Correlate clinically in assessing for cholecystitis. Small but abnormal amount of free pelvic fluid, nonspecific. Other imaging findings of potential clinical significance: Chronic ethmoid sinusitis. Aortic Atherosclerosis. Stable 5 mm right middle lobe pulmonary nodule, not hypermetabolic but below sensitive PET-CT size thresholds. Left nephrectomy. Notable pelvic floor laxity with cystocele. Chronic bilateral  Sacroiliitis.  04/03/18 PET/CT revealed No evidence for new or progressive hypermetabolic disease on today's study to suggest metastatic progression. 2. Stable appearance of the T7 metastatic lesion without Hypermetabolism. 3. Tiny focus of FDG accumulation identified along the skin of the low right inguinal fold. No associated lesion evident on CT. This may be related to urinary contaminant. 4. Cholelithiasis with similar appearance of diffuse gallbladder wall thickening. 5. Stable 5 mm right middle lobe pulmonary nodule. 6.  Aortic Atherosclerois. 7. Diffuse colonic diverticulosis.   09/18/18 CT A/P revealed "Common duct stent in place. Improvement to resolution in biliary duct dilatation compared to 08/29/2018. Interval cholecystectomy without acute complication. 2. Left nephrectomy, without evidence of metastatic disease. 3.  Tiny hiatal hernia. 4. Coronary artery atherosclerosis. Aortic Atherosclerosis. 5. Mild limitations secondary to lack of IV contrast. 6. Marked pelvic floor laxity with cystocele and rectal prolapse".  07/02/2019 PET/CT ([id number]) which revealed "1. No evidence of hypermetabolic metastatic disease."  #2 b/l adrenal metastases from [place] s/p left adrenalectomy with adrenal insuff - follows with Dr Buddy Duty.  #3 Small pulmonary lesions -- 10/16/17 PET/CT showed pulmonary less apparent than 03/28/17 PET/CT, decreasing from 26mm to 20mm diameter.   MRI brain shows no evidence of metastatic disease  #4 T7/8 Bone metastases - received Xgeva every 4 weeks from May 2018 to June 2019. 10/16/17 PET/CT revealed improvement with T7 metastatic lesion no longer significantly hypermetabolic.  -on Marchelle Folks  #5 ?liver mets- rpt PET/CT from 10/16/2017 shows no overt evidence of metastatic disease in the liver.  #6 Grade 1 Nausea - improved and intermittent. Hasnt used her anti-emetic as instructed so some nausea and decreased po intake.  #7 Grade 1 Diarrhea - resolved  #8 Hyponatremia -   resolved with sodium at 136 - likely related to some element of adrenal insufficiency, diarrhea, limited by mouth intake. Primarily solute free fluid intake.   #9 Acute on chronic renal insufficiency Creatine stable 1.44  #10 Hyperkalemia due to ACEI + RF- resolved  #11 Hemorrhoids -chronic with some bleeding --Recommended Sitz bath and OTC Anusol or Nupercaine for her hemorrhoid relief. -f/u with PCP for continued mx   #12 Moderate protein calorie malnutrition Weight has stabilized and improved Wt Readings from Last 3 Encounters:  10/11/20 127 lb 8 oz (57.8 kg)  09/13/20 127 lb 11.2 oz (57.9 kg)  08/30/20 127 lb (57.6 kg)   Plan: Continue healthy po intake/diabetic diet -Previously recommended the patient to drink atleast 48-64 oz of fluids daily -f/u with PCP/Cardiology for diuretics management  #13 Hypothyroidism/Adrenal insufficiency/Diabetes -Continue being followed by Dr. Buddy Duty  #14 HTN - ?control. Patient tends to be anxious and has higher blood pressures in the clinic. She can have increased blood pressure from [place] as well. Plan: -continued close f/u with her PCP /cardiology regarding the many elements necessary for her care that are not directly related to her oncology care. -ACEI held due to AKI and hyperkalemia - following with cardiology to mx this. Has been started on Amlodipine instead.  #15 Grade 1 mucositis- resolved -Advised the patient  to continue use of Magic Mouthwash to aid with nutrition  #16 CAD - positive cardiac nuclear stress test Following with cardiology and nephrology   #17 h/o Choledocholithiasis and cholelithiasis 07/10/18 US Abdomen revealed Biliary duct dilatation with 8 mm calculus at the level of the ampulla in the distal common bile duct. 2. Cholelithiasis. No gallbladder wall thickening or pericholecystic fluid. 3. Appearance of the liver raises concern for underlying hepatic cirrhosis. No focal liver lesions are demonstrable. 4.  Left  kidney absent. 5.  Small cysts in right kidney. 6.  Aortic Atherosclerosis.  S/p ERCP on 07/23/18 with Dr. Valarie Merino Mansouraty  09/02/18 Cytopathology from ERCP was suspicious for malignant cells in bile duct, but was not definitive, other two findings were benign.  # 18 Peripheral arterial disease -- following with cardiology/vascular surgery PLAN:   -Discussed labwork today, 10/25/2020; creatinine improved, counts stable. -Recommended pt discuss medical management with her vascular surgeon. Discussed  going back on blood thinner ASA 81mg  po daily due to blocked blood vessels and her heart. Discussed vasodilators. -Will provide patient with information regarding peripheral arterial disease.  -Advised pt that her heart and kidneys are limiting to the arterial bypass surgery and the huge volume shifts during that surgery. -Advised pt it would not be unreasonable to take a Vitamin B-Complex daily. -Recommended pt start Baby ASA back today and confirm this with Cardiologist soon. She sees her Cardiologist next in October. -No clinical or lab findings suggestive of progression of her metastatic renal cell carcinoma. -No prohibitive toxicities from Nivolumab. We will continue current treatment plan with Nivolumab with same premedications.  -Continue Xgeva every 6 weeks. -Will see back in 6 weeks with labs.   FOLLOW UP: Please schedule next 3 cycles of nivolumab with port flush and labs. MD visit in 6 weeks     The total time spent in the appointment was 25 minutes and more than 50% was on counseling and direct patient cares.  All of the patient's questions were answered with apparent satisfaction. The patient knows to call the clinic with any problems, questions or concerns, ordering and mx of immunotherapy   Sullivan Lone MD MS AAHIVMS [facility] Hematology/Oncology Physician [place]  (Office):       [phone] (Work cell):  [phone] (Fax):           [phone]  I,  Reinaldo Raddle, am acting as scribe for Dr. Sullivan Lone, MD.   .I have reviewed the above documentation for accuracy and completeness, and I agree with the above. Brunetta Genera MD

## 2020-10-25 ENCOUNTER — Inpatient Hospital Stay (HOSPITAL_BASED_OUTPATIENT_CLINIC_OR_DEPARTMENT_OTHER): Payer: Medicare Other | Admitting: Hematology

## 2020-10-25 ENCOUNTER — Inpatient Hospital Stay: Payer: Medicare Other | Attending: Hematology

## 2020-10-25 ENCOUNTER — Inpatient Hospital Stay: Payer: Medicare Other

## 2020-10-25 ENCOUNTER — Other Ambulatory Visit: Payer: Self-pay | Admitting: *Deleted

## 2020-10-25 ENCOUNTER — Other Ambulatory Visit: Payer: Self-pay

## 2020-10-25 VITALS — BP 133/55 | HR 63 | Temp 98.0°F | Resp 16

## 2020-10-25 DIAGNOSIS — E538 Deficiency of other specified B group vitamins: Secondary | ICD-10-CM | POA: Insufficient documentation

## 2020-10-25 DIAGNOSIS — Z87891 Personal history of nicotine dependence: Secondary | ICD-10-CM | POA: Diagnosis not present

## 2020-10-25 DIAGNOSIS — K219 Gastro-esophageal reflux disease without esophagitis: Secondary | ICD-10-CM | POA: Insufficient documentation

## 2020-10-25 DIAGNOSIS — I252 Old myocardial infarction: Secondary | ICD-10-CM | POA: Diagnosis not present

## 2020-10-25 DIAGNOSIS — E860 Dehydration: Secondary | ICD-10-CM | POA: Insufficient documentation

## 2020-10-25 DIAGNOSIS — C7951 Secondary malignant neoplasm of bone: Secondary | ICD-10-CM | POA: Insufficient documentation

## 2020-10-25 DIAGNOSIS — K449 Diaphragmatic hernia without obstruction or gangrene: Secondary | ICD-10-CM | POA: Diagnosis not present

## 2020-10-25 DIAGNOSIS — R948 Abnormal results of function studies of other organs and systems: Secondary | ICD-10-CM | POA: Insufficient documentation

## 2020-10-25 DIAGNOSIS — E785 Hyperlipidemia, unspecified: Secondary | ICD-10-CM | POA: Diagnosis not present

## 2020-10-25 DIAGNOSIS — C642 Malignant neoplasm of left kidney, except renal pelvis: Secondary | ICD-10-CM

## 2020-10-25 DIAGNOSIS — Z79899 Other long term (current) drug therapy: Secondary | ICD-10-CM | POA: Insufficient documentation

## 2020-10-25 DIAGNOSIS — I10 Essential (primary) hypertension: Secondary | ICD-10-CM | POA: Insufficient documentation

## 2020-10-25 DIAGNOSIS — Z5112 Encounter for antineoplastic immunotherapy: Secondary | ICD-10-CM

## 2020-10-25 DIAGNOSIS — Z5111 Encounter for antineoplastic chemotherapy: Secondary | ICD-10-CM | POA: Insufficient documentation

## 2020-10-25 DIAGNOSIS — R110 Nausea: Secondary | ICD-10-CM | POA: Diagnosis not present

## 2020-10-25 DIAGNOSIS — N2889 Other specified disorders of kidney and ureter: Secondary | ICD-10-CM | POA: Insufficient documentation

## 2020-10-25 DIAGNOSIS — E871 Hypo-osmolality and hyponatremia: Secondary | ICD-10-CM | POA: Diagnosis not present

## 2020-10-25 DIAGNOSIS — Z7189 Other specified counseling: Secondary | ICD-10-CM

## 2020-10-25 DIAGNOSIS — Z95828 Presence of other vascular implants and grafts: Secondary | ICD-10-CM

## 2020-10-25 DIAGNOSIS — E039 Hypothyroidism, unspecified: Secondary | ICD-10-CM | POA: Insufficient documentation

## 2020-10-25 LAB — CBC WITH DIFFERENTIAL (CANCER CENTER ONLY)
Abs Immature Granulocytes: 0.06 10*3/uL (ref 0.00–0.07)
Basophils Absolute: 0.1 10*3/uL (ref 0.0–0.1)
Basophils Relative: 1 %
Eosinophils Absolute: 0.2 10*3/uL (ref 0.0–0.5)
Eosinophils Relative: 3 %
HCT: 34.2 % — ABNORMAL LOW (ref 36.0–46.0)
Hemoglobin: 10.6 g/dL — ABNORMAL LOW (ref 12.0–15.0)
Immature Granulocytes: 1 %
Lymphocytes Relative: 18 %
Lymphs Abs: 1.3 10*3/uL (ref 0.7–4.0)
MCH: 26.2 pg (ref 26.0–34.0)
MCHC: 31 g/dL (ref 30.0–36.0)
MCV: 84.4 fL (ref 80.0–100.0)
Monocytes Absolute: 0.8 10*3/uL (ref 0.1–1.0)
Monocytes Relative: 11 %
Neutro Abs: 4.8 10*3/uL (ref 1.7–7.7)
Neutrophils Relative %: 66 %
Platelet Count: 212 10*3/uL (ref 150–400)
RBC: 4.05 MIL/uL (ref 3.87–5.11)
RDW: 15.2 % (ref 11.5–15.5)
WBC Count: 7.2 10*3/uL (ref 4.0–10.5)
nRBC: 0 % (ref 0.0–0.2)

## 2020-10-25 LAB — CMP (CANCER CENTER ONLY)
ALT: 23 U/L (ref 0–44)
AST: 27 U/L (ref 15–41)
Albumin: 3.3 g/dL — ABNORMAL LOW (ref 3.5–5.0)
Alkaline Phosphatase: 63 U/L (ref 38–126)
Anion gap: 8 (ref 5–15)
BUN: 29 mg/dL — ABNORMAL HIGH (ref 8–23)
CO2: 20 mmol/L — ABNORMAL LOW (ref 22–32)
Calcium: 9.4 mg/dL (ref 8.9–10.3)
Chloride: 112 mmol/L — ABNORMAL HIGH (ref 98–111)
Creatinine: 1.92 mg/dL — ABNORMAL HIGH (ref 0.44–1.00)
GFR, Estimated: 26 mL/min — ABNORMAL LOW (ref >60)
Glucose, Bld: 142 mg/dL — ABNORMAL HIGH (ref 70–99)
Potassium: 4.6 mmol/L (ref 3.5–5.1)
Sodium: 140 mmol/L (ref 135–145)
Total Bilirubin: 0.4 mg/dL (ref 0.3–1.2)
Total Protein: 6.5 g/dL (ref 6.5–8.1)

## 2020-10-25 MED ORDER — SODIUM CHLORIDE 0.9 % IV SOLN
240.0000 mg | Freq: Once | INTRAVENOUS | Status: AC
  Administered 2020-10-25: 240 mg via INTRAVENOUS
  Filled 2020-10-25: qty 24

## 2020-10-25 MED ORDER — HEPARIN SOD (PORK) LOCK FLUSH 100 UNIT/ML IV SOLN
500.0000 [IU] | Freq: Once | INTRAVENOUS | Status: AC | PRN
  Administered 2020-10-25: 500 [IU]
  Filled 2020-10-25: qty 5

## 2020-10-25 MED ORDER — SODIUM CHLORIDE 0.9% FLUSH
10.0000 mL | INTRAVENOUS | Status: DC | PRN
  Administered 2020-10-25: 10 mL
  Filled 2020-10-25: qty 10

## 2020-10-25 MED ORDER — DENOSUMAB 120 MG/1.7ML ~~LOC~~ SOLN
120.0000 mg | Freq: Once | SUBCUTANEOUS | Status: AC
  Administered 2020-10-25: 120 mg via SUBCUTANEOUS

## 2020-10-25 MED ORDER — SODIUM CHLORIDE 0.9 % IV SOLN
INTRAVENOUS | Status: AC
  Filled 2020-10-25 (×2): qty 250

## 2020-10-25 MED ORDER — DENOSUMAB 120 MG/1.7ML ~~LOC~~ SOLN
SUBCUTANEOUS | Status: AC
  Filled 2020-10-25: qty 1.7

## 2020-10-25 MED ORDER — SODIUM CHLORIDE 0.9% FLUSH
10.0000 mL | Freq: Once | INTRAVENOUS | Status: AC
  Administered 2020-10-25: 10 mL
  Filled 2020-10-25: qty 10

## 2020-10-25 NOTE — Progress Notes (Signed)
Per Dr. Kale, ok to treat with elevated creatinine.  

## 2020-10-25 NOTE — Patient Instructions (Signed)
[facility]  ? Discharge Instructions: ?Thank you for choosing [facility] to provide your oncology and hematology care.  ? ?If you have a lab appointment with the Cancer Center, please go directly to the Cancer Center and check in at the registration area. ?  ?Wear comfortable clothing and clothing appropriate for easy access to any Portacath or PICC line.  ? ?We strive to give you quality time with your provider. You may need to reschedule your appointment if you arrive late (15 or more minutes).  Arriving late affects you and other patients whose appointments are after yours.  Also, if you miss three or more appointments without notifying the office, you may be dismissed from the clinic at the provider?s discretion.    ?  ?For prescription refill requests, have your pharmacy contact our office and allow 72 hours for refills to be completed.   ? ?Today you received the following chemotherapy and/or immunotherapy agents: nivolumab    ?  ?To help prevent nausea and vomiting after your treatment, we encourage you to take your nausea medication as directed. ? ?BELOW ARE SYMPTOMS THAT SHOULD BE REPORTED IMMEDIATELY: ?*FEVER GREATER THAN 100.4 F (38 ?C) OR HIGHER ?*CHILLS OR SWEATING ?*NAUSEA AND VOMITING THAT IS NOT CONTROLLED WITH YOUR NAUSEA MEDICATION ?*UNUSUAL SHORTNESS OF BREATH ?*UNUSUAL BRUISING OR BLEEDING ?*URINARY PROBLEMS (pain or burning when urinating, or frequent urination) ?*BOWEL PROBLEMS (unusual diarrhea, constipation, pain near the anus) ?TENDERNESS IN MOUTH AND THROAT WITH OR WITHOUT PRESENCE OF ULCERS (sore throat, sores in mouth, or a toothache) ?UNUSUAL RASH, SWELLING OR PAIN  ?UNUSUAL VAGINAL DISCHARGE OR ITCHING  ? ?Items with * indicate a potential emergency and should be followed up as soon as possible or go to the Emergency Department if any problems should occur. ? ?Please show the CHEMOTHERAPY ALERT CARD or IMMUNOTHERAPY ALERT CARD at check-in  to the Emergency Department and triage nurse. ? ?Should you have questions after your visit or need to cancel or reschedule your appointment, please contact [facility]  Dept: [phone]  and follow the prompts.  Office hours are 8:00 a.m. to 4:30 p.m. Monday - Friday. Please note that voicemails left after 4:00 p.m. may not be returned until the following business day.  We are closed weekends and major holidays. You have access to a nurse at all times for urgent questions. Please call the main number to the clinic Dept: [phone] and follow the prompts. ? ? ?For any non-urgent questions, you may also contact your provider using MyChart. We now offer e-Visits for anyone 18 and older to request care online for non-urgent symptoms. For details visit [email or web address]. ?  ?Also download the MyChart app! Go to the app store, search "MyChart", open the app, select [facility], and log in with your MyChart username and password. ? ?Due to Covid, a mask is required upon entering the hospital/clinic. If you do not have a mask, one will be given to you upon arrival. For doctor visits, patients may have 1 support person aged 18 or older with them. For treatment visits, patients cannot have anyone with them due to current Covid guidelines and our immunocompromised population.  ? ?

## 2020-10-26 ENCOUNTER — Telehealth: Payer: Self-pay | Admitting: Hematology

## 2020-10-26 NOTE — Telephone Encounter (Signed)
Scheduled follow-up appointments per 7/6 los. Patient is aware. 

## 2020-10-31 ENCOUNTER — Encounter: Payer: Self-pay | Admitting: Hematology

## 2020-11-07 ENCOUNTER — Inpatient Hospital Stay: Payer: Medicare Other

## 2020-11-07 ENCOUNTER — Other Ambulatory Visit: Payer: Self-pay

## 2020-11-07 VITALS — BP 132/60 | HR 62 | Temp 98.2°F | Resp 18

## 2020-11-07 DIAGNOSIS — Z95828 Presence of other vascular implants and grafts: Secondary | ICD-10-CM

## 2020-11-07 DIAGNOSIS — C642 Malignant neoplasm of left kidney, except renal pelvis: Secondary | ICD-10-CM

## 2020-11-07 DIAGNOSIS — Z5111 Encounter for antineoplastic chemotherapy: Secondary | ICD-10-CM | POA: Diagnosis not present

## 2020-11-07 DIAGNOSIS — Z7189 Other specified counseling: Secondary | ICD-10-CM

## 2020-11-07 DIAGNOSIS — C7951 Secondary malignant neoplasm of bone: Secondary | ICD-10-CM

## 2020-11-07 LAB — CBC WITH DIFFERENTIAL (CANCER CENTER ONLY)
Abs Immature Granulocytes: 0.14 10*3/uL — ABNORMAL HIGH (ref 0.00–0.07)
Basophils Absolute: 0.1 10*3/uL (ref 0.0–0.1)
Basophils Relative: 1 %
Eosinophils Absolute: 0.2 10*3/uL (ref 0.0–0.5)
Eosinophils Relative: 2 %
HCT: 34.5 % — ABNORMAL LOW (ref 36.0–46.0)
Hemoglobin: 10.6 g/dL — ABNORMAL LOW (ref 12.0–15.0)
Immature Granulocytes: 1 %
Lymphocytes Relative: 16 %
Lymphs Abs: 1.6 10*3/uL (ref 0.7–4.0)
MCH: 25.6 pg — ABNORMAL LOW (ref 26.0–34.0)
MCHC: 30.7 g/dL (ref 30.0–36.0)
MCV: 83.3 fL (ref 80.0–100.0)
Monocytes Absolute: 1 10*3/uL (ref 0.1–1.0)
Monocytes Relative: 11 %
Neutro Abs: 6.7 10*3/uL (ref 1.7–7.7)
Neutrophils Relative %: 69 %
Platelet Count: 326 10*3/uL (ref 150–400)
RBC: 4.14 MIL/uL (ref 3.87–5.11)
RDW: 15.3 % (ref 11.5–15.5)
WBC Count: 9.8 10*3/uL (ref 4.0–10.5)
nRBC: 0 % (ref 0.0–0.2)

## 2020-11-07 LAB — CMP (CANCER CENTER ONLY)
ALT: 22 U/L (ref 0–44)
AST: 24 U/L (ref 15–41)
Albumin: 3.4 g/dL — ABNORMAL LOW (ref 3.5–5.0)
Alkaline Phosphatase: 63 U/L (ref 38–126)
Anion gap: 10 (ref 5–15)
BUN: 38 mg/dL — ABNORMAL HIGH (ref 8–23)
CO2: 17 mmol/L — ABNORMAL LOW (ref 22–32)
Calcium: 8.9 mg/dL (ref 8.9–10.3)
Chloride: 113 mmol/L — ABNORMAL HIGH (ref 98–111)
Creatinine: 2.22 mg/dL — ABNORMAL HIGH (ref 0.44–1.00)
GFR, Estimated: 22 mL/min — ABNORMAL LOW (ref >60)
Glucose, Bld: 154 mg/dL — ABNORMAL HIGH (ref 70–99)
Potassium: 4.2 mmol/L (ref 3.5–5.1)
Sodium: 140 mmol/L (ref 135–145)
Total Bilirubin: 0.4 mg/dL (ref 0.3–1.2)
Total Protein: 6.7 g/dL (ref 6.5–8.1)

## 2020-11-07 MED ORDER — HEPARIN SOD (PORK) LOCK FLUSH 100 UNIT/ML IV SOLN
500.0000 [IU] | Freq: Once | INTRAVENOUS | Status: AC | PRN
  Administered 2020-11-07: 500 [IU]
  Filled 2020-11-07: qty 5

## 2020-11-07 MED ORDER — SODIUM CHLORIDE 0.9% FLUSH
10.0000 mL | INTRAVENOUS | Status: DC | PRN
  Administered 2020-11-07: 10 mL
  Filled 2020-11-07: qty 10

## 2020-11-07 MED ORDER — SODIUM CHLORIDE 0.9% FLUSH
10.0000 mL | Freq: Once | INTRAVENOUS | Status: AC
  Administered 2020-11-07: 10 mL
  Filled 2020-11-07: qty 10

## 2020-11-07 MED ORDER — SODIUM CHLORIDE 0.9 % IV SOLN
240.0000 mg | Freq: Once | INTRAVENOUS | Status: AC
  Administered 2020-11-07: 240 mg via INTRAVENOUS
  Filled 2020-11-07: qty 24

## 2020-11-07 MED ORDER — SODIUM CHLORIDE 0.9 % IV SOLN
INTRAVENOUS | Status: AC
  Filled 2020-11-07: qty 250

## 2020-11-07 NOTE — Patient Instructions (Signed)
[facility] Discharge Instructions for Patients Receiving Chemotherapy  Today you received the following immunotherapy agent: Nivolumab (Opdivo)  To help prevent nausea and vomiting after your treatment, we encourage you to take your nausea medication as directed by your MD.   If you develop nausea and vomiting that is not controlled by your nausea medication, call the clinic.   BELOW ARE SYMPTOMS THAT SHOULD BE REPORTED IMMEDIATELY:  *FEVER GREATER THAN 100.5 F  *CHILLS WITH OR WITHOUT FEVER  NAUSEA AND VOMITING THAT IS NOT CONTROLLED WITH YOUR NAUSEA MEDICATION  *UNUSUAL SHORTNESS OF BREATH  *UNUSUAL BRUISING OR BLEEDING  TENDERNESS IN MOUTH AND THROAT WITH OR WITHOUT PRESENCE OF ULCERS  *URINARY PROBLEMS  *BOWEL PROBLEMS  UNUSUAL RASH Items with * indicate a potential emergency and should be followed up as soon as possible.  Feel free to call the clinic should you have any questions or concerns. The clinic phone number is [phone].  Please show the CHEMO ALERT CARD at check-in to the Emergency Department and triage nurse.  Denosumab injection What is this medicine? DENOSUMAB (den oh sue mab) slows bone breakdown. Prolia is used to treat osteoporosis in women after menopause and in men, and in people who are taking corticosteroids for 6 months or more. Xgeva is used to treat a high calcium level due to cancer and to prevent bone fractures and other bone problems caused by multiple myeloma or cancer bone metastases. Xgeva is also used to treat giant cell tumor of the bone. This medicine may be used for other purposes; ask your health care provider or pharmacist if you have questions. COMMON BRAND NAME(S): Prolia, XGEVA What should I tell my health care provider before I take this medicine? They need to know if you have any of these conditions:  dental disease  having surgery or tooth extraction  infection  kidney disease  low levels of calcium or  Vitamin D in the blood  malnutrition  on hemodialysis  skin conditions or sensitivity  thyroid or parathyroid disease  an unusual reaction to denosumab, other medicines, foods, dyes, or preservatives  pregnant or trying to get pregnant  breast-feeding How should I use this medicine? This medicine is for injection under the skin. It is given by a health care professional in a hospital or clinic setting. A special MedGuide will be given to you before each treatment. Be sure to read this information carefully each time. For Prolia, talk to your pediatrician regarding the use of this medicine in children. Special care may be needed. For Xgeva, talk to your pediatrician regarding the use of this medicine in children. While this drug may be prescribed for children as young as 13 years for selected conditions, precautions do apply. Overdosage: If you think you have taken too much of this medicine contact a poison control center or emergency room at once. NOTE: This medicine is only for you. Do not share this medicine with others. What if I miss a dose? It is important not to miss your dose. Call your doctor or health care professional if you are unable to keep an appointment. What may interact with this medicine? Do not take this medicine with any of the following medications:  other medicines containing denosumab This medicine may also interact with the following medications:  medicines that lower your chance of fighting infection  steroid medicines like prednisone or cortisone This list may not describe all possible interactions. Give your health care provider a list of all   the medicines, herbs, non-prescription drugs, or dietary supplements you use. Also tell them if you smoke, drink alcohol, or use illegal drugs. Some items may interact with your medicine. What should I watch for while using this medicine? Visit your doctor or health care professional for regular checks on your  progress. Your doctor or health care professional may order blood tests and other tests to see how you are doing. Call your doctor or health care professional for advice if you get a fever, chills or sore throat, or other symptoms of a cold or flu. Do not treat yourself. This drug may decrease your body's ability to fight infection. Try to avoid being around people who are sick. You should make sure you get enough calcium and vitamin D while you are taking this medicine, unless your doctor tells you not to. Discuss the foods you eat and the vitamins you take with your health care professional. See your dentist regularly. Brush and floss your teeth as directed. Before you have any dental work done, tell your dentist you are receiving this medicine. Do not become pregnant while taking this medicine or for 5 months after stopping it. Talk with your doctor or health care professional about your birth control options while taking this medicine. Women should inform their doctor if they wish to become pregnant or think they might be pregnant. There is a potential for serious side effects to an unborn child. Talk to your health care professional or pharmacist for more information. What side effects may I notice from receiving this medicine? Side effects that you should report to your doctor or health care professional as soon as possible:  allergic reactions like skin rash, itching or hives, swelling of the face, lips, or tongue  bone pain  breathing problems  dizziness  jaw pain, especially after dental work  redness, blistering, peeling of the skin  signs and symptoms of infection like fever or chills; cough; sore throat; pain or trouble passing urine  signs of low calcium like fast heartbeat, muscle cramps or muscle pain; pain, tingling, numbness in the hands or feet; seizures  unusual bleeding or bruising  unusually weak or tired Side effects that usually do not require medical attention  (report to your doctor or health care professional if they continue or are bothersome):  constipation  diarrhea  headache  joint pain  loss of appetite  muscle pain  runny nose  tiredness  upset stomach This list may not describe all possible side effects. Call your doctor for medical advice about side effects. You may report side effects to FDA at 1-800-FDA-1088. Where should I keep my medicine? This medicine is only given in a clinic, doctor's office, or other health care setting and will not be stored at home. NOTE: This sheet is a summary. It may not cover all possible information. If you have questions about this medicine, talk to your doctor, pharmacist, or health care provider.  2021 Elsevier/Gold Standard (2017-08-15 16:10:44)  

## 2020-11-07 NOTE — Patient Instructions (Signed)

## 2020-11-07 NOTE — Progress Notes (Signed)
Ok to treat today with Scr 2.22 per MD

## 2020-11-22 ENCOUNTER — Inpatient Hospital Stay: Payer: Medicare Other

## 2020-11-22 ENCOUNTER — Other Ambulatory Visit: Payer: Self-pay | Admitting: Hematology

## 2020-11-22 ENCOUNTER — Inpatient Hospital Stay: Payer: Medicare Other | Attending: Hematology

## 2020-11-22 ENCOUNTER — Other Ambulatory Visit: Payer: Self-pay

## 2020-11-22 VITALS — BP 137/70 | HR 66 | Temp 98.9°F | Resp 18 | Wt 128.5 lb

## 2020-11-22 DIAGNOSIS — C642 Malignant neoplasm of left kidney, except renal pelvis: Secondary | ICD-10-CM | POA: Diagnosis not present

## 2020-11-22 DIAGNOSIS — E1151 Type 2 diabetes mellitus with diabetic peripheral angiopathy without gangrene: Secondary | ICD-10-CM | POA: Insufficient documentation

## 2020-11-22 DIAGNOSIS — Z5112 Encounter for antineoplastic immunotherapy: Secondary | ICD-10-CM | POA: Insufficient documentation

## 2020-11-22 DIAGNOSIS — Z7189 Other specified counseling: Secondary | ICD-10-CM

## 2020-11-22 DIAGNOSIS — K5900 Constipation, unspecified: Secondary | ICD-10-CM | POA: Diagnosis not present

## 2020-11-22 DIAGNOSIS — E1136 Type 2 diabetes mellitus with diabetic cataract: Secondary | ICD-10-CM | POA: Insufficient documentation

## 2020-11-22 DIAGNOSIS — R7989 Other specified abnormal findings of blood chemistry: Secondary | ICD-10-CM | POA: Diagnosis not present

## 2020-11-22 DIAGNOSIS — Z86010 Personal history of colonic polyps: Secondary | ICD-10-CM | POA: Diagnosis not present

## 2020-11-22 DIAGNOSIS — C7951 Secondary malignant neoplasm of bone: Secondary | ICD-10-CM | POA: Diagnosis not present

## 2020-11-22 DIAGNOSIS — I10 Essential (primary) hypertension: Secondary | ICD-10-CM | POA: Diagnosis not present

## 2020-11-22 DIAGNOSIS — E039 Hypothyroidism, unspecified: Secondary | ICD-10-CM | POA: Insufficient documentation

## 2020-11-22 DIAGNOSIS — I252 Old myocardial infarction: Secondary | ICD-10-CM | POA: Insufficient documentation

## 2020-11-22 DIAGNOSIS — K449 Diaphragmatic hernia without obstruction or gangrene: Secondary | ICD-10-CM | POA: Insufficient documentation

## 2020-11-22 DIAGNOSIS — K219 Gastro-esophageal reflux disease without esophagitis: Secondary | ICD-10-CM | POA: Insufficient documentation

## 2020-11-22 DIAGNOSIS — I2510 Atherosclerotic heart disease of native coronary artery without angina pectoris: Secondary | ICD-10-CM | POA: Insufficient documentation

## 2020-11-22 DIAGNOSIS — E785 Hyperlipidemia, unspecified: Secondary | ICD-10-CM | POA: Diagnosis not present

## 2020-11-22 LAB — CMP (CANCER CENTER ONLY)
ALT: 24 U/L (ref 0–44)
AST: 21 U/L (ref 15–41)
Albumin: 3.6 g/dL (ref 3.5–5.0)
Alkaline Phosphatase: 63 U/L (ref 38–126)
Anion gap: 9 (ref 5–15)
BUN: 37 mg/dL — ABNORMAL HIGH (ref 8–23)
CO2: 17 mmol/L — ABNORMAL LOW (ref 22–32)
Calcium: 9.7 mg/dL (ref 8.9–10.3)
Chloride: 114 mmol/L — ABNORMAL HIGH (ref 98–111)
Creatinine: 2.12 mg/dL — ABNORMAL HIGH (ref 0.44–1.00)
GFR, Estimated: 23 mL/min — ABNORMAL LOW (ref >60)
Glucose, Bld: 146 mg/dL — ABNORMAL HIGH (ref 70–99)
Potassium: 4.1 mmol/L (ref 3.5–5.1)
Sodium: 140 mmol/L (ref 135–145)
Total Bilirubin: 0.4 mg/dL (ref 0.3–1.2)
Total Protein: 6.6 g/dL (ref 6.5–8.1)

## 2020-11-22 LAB — CBC WITH DIFFERENTIAL (CANCER CENTER ONLY)
Abs Immature Granulocytes: 0.08 10*3/uL — ABNORMAL HIGH (ref 0.00–0.07)
Basophils Absolute: 0.1 10*3/uL (ref 0.0–0.1)
Basophils Relative: 1 %
Eosinophils Absolute: 0.2 10*3/uL (ref 0.0–0.5)
Eosinophils Relative: 3 %
HCT: 35 % — ABNORMAL LOW (ref 36.0–46.0)
Hemoglobin: 10.8 g/dL — ABNORMAL LOW (ref 12.0–15.0)
Immature Granulocytes: 1 %
Lymphocytes Relative: 23 %
Lymphs Abs: 1.7 10*3/uL (ref 0.7–4.0)
MCH: 25.7 pg — ABNORMAL LOW (ref 26.0–34.0)
MCHC: 30.9 g/dL (ref 30.0–36.0)
MCV: 83.1 fL (ref 80.0–100.0)
Monocytes Absolute: 1 10*3/uL (ref 0.1–1.0)
Monocytes Relative: 13 %
Neutro Abs: 4.4 10*3/uL (ref 1.7–7.7)
Neutrophils Relative %: 59 %
Platelet Count: 300 10*3/uL (ref 150–400)
RBC: 4.21 MIL/uL (ref 3.87–5.11)
RDW: 16.2 % — ABNORMAL HIGH (ref 11.5–15.5)
WBC Count: 7.4 10*3/uL (ref 4.0–10.5)
nRBC: 0 % (ref 0.0–0.2)

## 2020-11-22 MED ORDER — HEPARIN SOD (PORK) LOCK FLUSH 100 UNIT/ML IV SOLN
500.0000 [IU] | Freq: Once | INTRAVENOUS | Status: AC | PRN
  Administered 2020-11-22: 500 [IU]
  Filled 2020-11-22: qty 5

## 2020-11-22 MED ORDER — SODIUM CHLORIDE 0.9% FLUSH
10.0000 mL | INTRAVENOUS | Status: DC | PRN
  Administered 2020-11-22: 10 mL
  Filled 2020-11-22: qty 10

## 2020-11-22 MED ORDER — SODIUM CHLORIDE 0.9 % IV SOLN
INTRAVENOUS | Status: AC
  Filled 2020-11-22: qty 250

## 2020-11-22 MED ORDER — SODIUM CHLORIDE 0.9 % IV SOLN
240.0000 mg | Freq: Once | INTRAVENOUS | Status: AC
  Administered 2020-11-22: 240 mg via INTRAVENOUS
  Filled 2020-11-22: qty 24

## 2020-11-22 NOTE — Patient Instructions (Signed)
[facility]  Discharge Instructions: Thank you for choosing [facility] to provide your oncology and hematology care.   If you have a lab appointment with the [facility], please go directly to the [facility] and check in at the registration area.   Wear comfortable clothing and clothing appropriate for easy access to any Portacath or PICC line.   We strive to give you quality time with your provider. You may need to reschedule your appointment if you arrive late (15 or more minutes).  Arriving late affects you and other patients whose appointments are after yours.  Also, if you miss three or more appointments without notifying the office, you may be dismissed from the clinic at the provider's discretion.      For prescription refill requests, have your pharmacy contact our office and allow 72 hours for refills to be completed.    Today you received the following chemotherapy and/or immunotherapy agents Opdivo      To help prevent nausea and vomiting after your treatment, we encourage you to take your nausea medication as directed.  BELOW ARE SYMPTOMS THAT SHOULD BE REPORTED IMMEDIATELY: *FEVER GREATER THAN 100.4 F (38 C) OR HIGHER *CHILLS OR SWEATING *NAUSEA AND VOMITING THAT IS NOT CONTROLLED WITH YOUR NAUSEA MEDICATION *UNUSUAL SHORTNESS OF BREATH *UNUSUAL BRUISING OR BLEEDING *URINARY PROBLEMS (pain or burning when urinating, or frequent urination) *BOWEL PROBLEMS (unusual diarrhea, constipation, pain near the anus) TENDERNESS IN MOUTH AND THROAT WITH OR WITHOUT PRESENCE OF ULCERS (sore throat, sores in mouth, or a toothache) UNUSUAL RASH, SWELLING OR PAIN  UNUSUAL VAGINAL DISCHARGE OR ITCHING   Items with * indicate a potential emergency and should be followed up as soon as possible or go to the Emergency Department if any problems should occur.  Please show the CHEMOTHERAPY ALERT CARD or IMMUNOTHERAPY ALERT CARD at check-in to the  Emergency Department and triage nurse.  Should you have questions after your visit or need to cancel or reschedule your appointment, please contact [facility]  Dept: [phone]  and follow the prompts.  Office hours are 8:00 a.m. to 4:30 p.m. Monday - Friday. Please note that voicemails left after 4:00 p.m. may not be returned until the following business day.  We are closed weekends and major holidays. You have access to a nurse at all times for urgent questions. Please call the main number to the clinic Dept: [phone] and follow the prompts.   For any non-urgent questions, you may also contact your provider using MyChart. We now offer e-Visits for anyone 83 and older to request care online for non-urgent symptoms. For details visit [email or web address].   Also download the MyChart app! Go to the app store, search "MyChart", open the app, select [facility], and log in with your MyChart username and password.  Due to Covid, a mask is required upon entering the hospital/clinic. If you do not have a mask, one will be given to you upon arrival. For doctor visits, patients may have 1 support person aged 21 or older with them. For treatment visits, patients cannot have anyone with them due to current Covid guidelines and our immunocompromised population.   Nivolumab injection What is this medication? NIVOLUMAB (nye VOL ue mab) is a monoclonal antibody. It treats certain types of cancer. Some of the cancers treated are colon cancer, head and neck cancer,Hodgkin lymphoma, lung cancer, and melanoma. This medicine may be used for other purposes; ask your health  care provider orpharmacist if you have questions. COMMON BRAND NAME(S): Opdivo What should I tell my care team before I take this medication? They need to know if you have any of these conditions: autoimmune diseases like Crohn's disease, ulcerative colitis, or lupus have had or planning to have an allogeneic  stem cell transplant (uses someone else's stem cells) history of chest radiation history of organ transplant nervous system problems like myasthenia gravis or Guillain-Barre syndrome an unusual or allergic reaction to nivolumab, other medicines, foods, dyes, or preservatives pregnant or trying to get pregnant breast-feeding How should I use this medication? This medicine is for infusion into a vein. It is given by a health careprofessional in a hospital or clinic setting. A special MedGuide will be given to you before each treatment. Be sure to readthis information carefully each time. Talk to your pediatrician regarding the use of this medicine in children. While this drug may be prescribed for children as young as 12 years for selectedconditions, precautions do apply. Overdosage: If you think you have taken too much of this medicine contact apoison control center or emergency room at once. NOTE: This medicine is only for you. Do not share this medicine with others. What if I miss a dose? It is important not to miss your dose. Call your doctor or health careprofessional if you are unable to keep an appointment. What may interact with this medication? Interactions have not been studied. This list may not describe all possible interactions. Give your health care provider a list of all the medicines, herbs, non-prescription drugs, or dietary supplements you use. Also tell them if you smoke, drink alcohol, or use illegaldrugs. Some items may interact with your medicine. What should I watch for while using this medication? This drug may make you feel generally unwell. Continue your course of treatmenteven though you feel ill unless your doctor tells you to stop. You may need blood work done while you are taking this medicine. Do not become pregnant while taking this medicine or for 5 months after stopping it. Women should inform their doctor if they wish to become pregnant or think they might be  pregnant. There is a potential for serious side effects to an unborn child. Talk to your health care professional or pharmacist for more information. Do not breast-feed an infant while taking this medicine orfor 5 months after stopping it. What side effects may I notice from receiving this medication? Side effects that you should report to your doctor or health care professionalas soon as possible: allergic reactions like skin rash, itching or hives, swelling of the face, lips, or tongue breathing problems blood in the urine bloody or watery diarrhea or black, tarry stools changes in emotions or moods changes in vision chest pain cough dizziness feeling faint or lightheaded, falls fever, chills headache with fever, neck stiffness, confusion, loss of memory, sensitivity to light, hallucination, loss of contact with reality, or seizures joint pain mouth sores redness, blistering, peeling or loosening of the skin, including inside the mouth severe muscle pain or weakness signs and symptoms of high blood sugar such as dizziness; dry mouth; dry skin; fruity breath; nausea; stomach pain; increased hunger or thirst; increased urination signs and symptoms of kidney injury like trouble passing urine or change in the amount of urine signs and symptoms of liver injury like dark yellow or brown urine; general ill feeling or flu-like symptoms; light-colored stools; loss of appetite; nausea; right upper belly pain; unusually weak or tired; yellowing of the  eyes or skin swelling of the ankles, feet, hands trouble passing urine or change in the amount of urine unusually weak or tired weight gain or loss Side effects that usually do not require medical attention (report to yourdoctor or health care professional if they continue or are bothersome): bone pain constipation decreased appetite diarrhea muscle pain nausea, vomiting tiredness This list may not describe all possible side effects. Call your  doctor for medical advice about side effects. You may report side effects to FDA at1-800-FDA-1088. Where should I keep my medication? This drug is given in a hospital or clinic and will not be stored at home. NOTE: This sheet is a summary. It may not cover all possible information. If you have questions about this medicine, talk to your doctor, pharmacist, orhealth care provider.  2022 Elsevier/Gold Standard (2019-08-11 10:08:25)

## 2020-11-22 NOTE — Progress Notes (Signed)
Per [name] ok to treat with creatnine of 2.12

## 2020-12-04 ENCOUNTER — Telehealth: Payer: Self-pay | Admitting: Hematology

## 2020-12-04 NOTE — Telephone Encounter (Signed)
Changed upcoming provider visit due to provider's emergency. Patient is aware of changes.

## 2020-12-05 ENCOUNTER — Other Ambulatory Visit: Payer: Self-pay

## 2020-12-05 DIAGNOSIS — C642 Malignant neoplasm of left kidney, except renal pelvis: Secondary | ICD-10-CM

## 2020-12-06 ENCOUNTER — Inpatient Hospital Stay: Payer: Medicare Other

## 2020-12-06 ENCOUNTER — Inpatient Hospital Stay (HOSPITAL_BASED_OUTPATIENT_CLINIC_OR_DEPARTMENT_OTHER): Payer: Medicare Other | Admitting: Adult Health

## 2020-12-06 ENCOUNTER — Inpatient Hospital Stay: Payer: Medicare Other | Admitting: Hematology

## 2020-12-06 ENCOUNTER — Other Ambulatory Visit: Payer: Self-pay

## 2020-12-06 ENCOUNTER — Encounter: Payer: Self-pay | Admitting: Adult Health

## 2020-12-06 VITALS — BP 135/71 | HR 70 | Temp 97.6°F | Resp 17 | Ht 59.0 in | Wt 128.5 lb

## 2020-12-06 DIAGNOSIS — C7951 Secondary malignant neoplasm of bone: Secondary | ICD-10-CM | POA: Diagnosis not present

## 2020-12-06 DIAGNOSIS — Z5112 Encounter for antineoplastic immunotherapy: Secondary | ICD-10-CM | POA: Diagnosis not present

## 2020-12-06 DIAGNOSIS — Z95828 Presence of other vascular implants and grafts: Secondary | ICD-10-CM

## 2020-12-06 DIAGNOSIS — C642 Malignant neoplasm of left kidney, except renal pelvis: Secondary | ICD-10-CM

## 2020-12-06 DIAGNOSIS — Z7189 Other specified counseling: Secondary | ICD-10-CM

## 2020-12-06 DIAGNOSIS — C649 Malignant neoplasm of unspecified kidney, except renal pelvis: Secondary | ICD-10-CM

## 2020-12-06 LAB — CMP (CANCER CENTER ONLY)
ALT: 30 U/L (ref 0–44)
AST: 24 U/L (ref 15–41)
Albumin: 3.9 g/dL (ref 3.5–5.0)
Alkaline Phosphatase: 65 U/L (ref 38–126)
Anion gap: 10 (ref 5–15)
BUN: 31 mg/dL — ABNORMAL HIGH (ref 8–23)
CO2: 18 mmol/L — ABNORMAL LOW (ref 22–32)
Calcium: 9.5 mg/dL (ref 8.9–10.3)
Chloride: 112 mmol/L — ABNORMAL HIGH (ref 98–111)
Creatinine: 2.22 mg/dL — ABNORMAL HIGH (ref 0.44–1.00)
GFR, Estimated: 22 mL/min — ABNORMAL LOW (ref >60)
Glucose, Bld: 156 mg/dL — ABNORMAL HIGH (ref 70–99)
Potassium: 4.4 mmol/L (ref 3.5–5.1)
Sodium: 140 mmol/L (ref 135–145)
Total Bilirubin: 0.4 mg/dL (ref 0.3–1.2)
Total Protein: 7.3 g/dL (ref 6.5–8.1)

## 2020-12-06 LAB — CBC WITH DIFFERENTIAL (CANCER CENTER ONLY)
Abs Immature Granulocytes: 0.07 10*3/uL (ref 0.00–0.07)
Basophils Absolute: 0.1 10*3/uL (ref 0.0–0.1)
Basophils Relative: 1 %
Eosinophils Absolute: 0.1 10*3/uL (ref 0.0–0.5)
Eosinophils Relative: 2 %
HCT: 39 % (ref 36.0–46.0)
Hemoglobin: 11.7 g/dL — ABNORMAL LOW (ref 12.0–15.0)
Immature Granulocytes: 1 %
Lymphocytes Relative: 15 %
Lymphs Abs: 1.3 10*3/uL (ref 0.7–4.0)
MCH: 25.6 pg — ABNORMAL LOW (ref 26.0–34.0)
MCHC: 30 g/dL (ref 30.0–36.0)
MCV: 85.3 fL (ref 80.0–100.0)
Monocytes Absolute: 0.8 10*3/uL (ref 0.1–1.0)
Monocytes Relative: 10 %
Neutro Abs: 6 10*3/uL (ref 1.7–7.7)
Neutrophils Relative %: 71 %
Platelet Count: 314 10*3/uL (ref 150–400)
RBC: 4.57 MIL/uL (ref 3.87–5.11)
RDW: 16.3 % — ABNORMAL HIGH (ref 11.5–15.5)
WBC Count: 8.3 10*3/uL (ref 4.0–10.5)
nRBC: 0 % (ref 0.0–0.2)

## 2020-12-06 MED ORDER — HEPARIN SOD (PORK) LOCK FLUSH 100 UNIT/ML IV SOLN
500.0000 [IU] | Freq: Once | INTRAVENOUS | Status: AC | PRN
  Administered 2020-12-06: 500 [IU]

## 2020-12-06 MED ORDER — SODIUM CHLORIDE 0.9 % IV SOLN
240.0000 mg | Freq: Once | INTRAVENOUS | Status: AC
  Administered 2020-12-06: 240 mg via INTRAVENOUS
  Filled 2020-12-06: qty 24

## 2020-12-06 MED ORDER — SODIUM CHLORIDE 0.9% FLUSH
10.0000 mL | INTRAVENOUS | Status: DC | PRN
Start: 2020-12-06 — End: 2020-12-06
  Administered 2020-12-06: 10 mL

## 2020-12-06 MED ORDER — DENOSUMAB 120 MG/1.7ML ~~LOC~~ SOLN
120.0000 mg | Freq: Once | SUBCUTANEOUS | Status: AC
  Administered 2020-12-06: 120 mg via SUBCUTANEOUS
  Filled 2020-12-06: qty 1.7

## 2020-12-06 MED ORDER — SODIUM CHLORIDE 0.9 % IV SOLN: INTRAVENOUS | Status: AC

## 2020-12-06 MED ORDER — ALTEPLASE 2 MG IJ SOLR
2.0000 mg | Freq: Once | INTRAMUSCULAR | Status: AC
  Administered 2020-12-06: 2 mg
  Filled 2020-12-06: qty 2

## 2020-12-06 MED ORDER — SODIUM CHLORIDE 0.9% FLUSH
10.0000 mL | Freq: Once | INTRAVENOUS | Status: AC
  Administered 2020-12-06: 10 mL

## 2020-12-06 NOTE — Progress Notes (Signed)
Unable to get blood return from pt's port. Cathflo was administered at 0846 by Delle Reining, [place]. Pt sent to lab for peripheral lab draw.

## 2020-12-06 NOTE — Assessment & Plan Note (Signed)
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  She is tolerating treatment well.  Her kidney function is stable, and she will proceed with treatment as she has been.  She will f/u with cardiology about taking aspirin if she doesn't hear from them.  She will return in 3 weeks for labs and infusion, and in 6 weeks for labs, f/u with [name], and infusion.  She knows to call for any questions that may arise between now and her next appointment.  We are happy to see her sooner if needed.

## 2020-12-06 NOTE — Patient Instructions (Addendum)
[facility]  Discharge Instructions: Thank you for choosing [facility] to provide your oncology and hematology care.   If you have a lab appointment with the [facility], please go directly to the [facility] and check in at the registration area.   Wear comfortable clothing and clothing appropriate for easy access to any Portacath or PICC line.   We strive to give you quality time with your provider. You may need to reschedule your appointment if you arrive late (15 or more minutes).  Arriving late affects you and other patients whose appointments are after yours.  Also, if you miss three or more appointments without notifying the office, you may be dismissed from the clinic at the provider's discretion.      For prescription refill requests, have your pharmacy contact our office and allow 72 hours for refills to be completed.    Today you received the following chemotherapy and/or immunotherapy agents Opdivo and Xgeva.      To help prevent nausea and vomiting after your treatment, we encourage you to take your nausea medication as directed.  BELOW ARE SYMPTOMS THAT SHOULD BE REPORTED IMMEDIATELY: *FEVER GREATER THAN 100.4 F (38 C) OR HIGHER *CHILLS OR SWEATING *NAUSEA AND VOMITING THAT IS NOT CONTROLLED WITH YOUR NAUSEA MEDICATION *UNUSUAL SHORTNESS OF BREATH *UNUSUAL BRUISING OR BLEEDING *URINARY PROBLEMS (pain or burning when urinating, or frequent urination) *BOWEL PROBLEMS (unusual diarrhea, constipation, pain near the anus) TENDERNESS IN MOUTH AND THROAT WITH OR WITHOUT PRESENCE OF ULCERS (sore throat, sores in mouth, or a toothache) UNUSUAL RASH, SWELLING OR PAIN  UNUSUAL VAGINAL DISCHARGE OR ITCHING   Items with * indicate a potential emergency and should be followed up as soon as possible or go to the Emergency Department if any problems should occur.  Please show the CHEMOTHERAPY ALERT CARD or IMMUNOTHERAPY ALERT CARD at  check-in to the Emergency Department and triage nurse.  Should you have questions after your visit or need to cancel or reschedule your appointment, please contact [facility]  Dept: [phone]  and follow the prompts.  Office hours are 8:00 a.m. to 4:30 p.m. Monday - Friday. Please note that voicemails left after 4:00 p.m. may not be returned until the following business day.  We are closed weekends and major holidays. You have access to a nurse at all times for urgent questions. Please call the main number to the clinic Dept: [phone] and follow the prompts.   For any non-urgent questions, you may also contact your provider using MyChart. We now offer e-Visits for anyone 71 and older to request care online for non-urgent symptoms. For details visit [email or web address].   Also download the MyChart app! Go to the app store, search "MyChart", open the app, select [facility], and log in with your MyChart username and password.  Due to Covid, a mask is required upon entering the hospital/clinic. If you do not have a mask, one will be given to you upon arrival. For doctor visits, patients may have 1 support person aged 79 or older with them. For treatment visits, patients cannot have anyone with them due to current Covid guidelines and our immunocompromised population.

## 2020-12-06 NOTE — Progress Notes (Signed)
Per Wilber Bihari, NP - okay to proceed with elevated creatinine.

## 2020-12-06 NOTE — Progress Notes (Signed)
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[place])   01/02/2018 -  Chemotherapy      Patient is on Antibody Plan: RENAL CELL CARCINOMA NIVOLUMAB Q14D     Renal cell carcinoma ([place])  04/11/2017 Initial Diagnosis   Renal cell carcinoma ([place])   01/02/2018 -  Chemotherapy   The patient had nivolumab (OPDIVO) 240 mg in sodium chloride 0.9 % 100 mL chemo infusion, 240 mg, Intravenous, Once, 47 of 50 cycles Administration: 240 mg (01/02/2018), 240 mg (01/15/2018), 240 mg (01/30/2018), 240 mg (02/13/2018), 240 mg (02/27/2018), 240 mg (03/13/2018), 240 mg (03/27/2018), 240 mg (04/10/2018), 240 mg (04/24/2018), 240 mg (05/08/2018), 240 mg (05/22/2018), 240 mg (06/05/2018), 240 mg (06/19/2018), 240 mg (08/28/2018), 240 mg (09/11/2018), 240 mg (10/09/2018), 240 mg (10/22/2018), 240 mg (11/06/2018), 240 mg (11/20/2018), 240 mg (12/04/2018), 240 mg (12/18/2018), 240 mg (01/01/2019), 240 mg (01/15/2019), 240 mg (01/29/2019), 240 mg (02/12/2019), 240 mg (02/26/2019), 240 mg (03/12/2019), 240 mg (03/26/2019), 240 mg (04/09/2019), 240 mg (04/28/2019), 240 mg (05/12/2019), 240 mg (05/27/2019), 240 mg (06/09/2019), 240 mg (06/23/2019), 240 mg (07/07/2019), 240 mg (07/21/2019), 240 mg (08/04/2019), 240 mg (08/18/2019), 240 mg (09/03/2019), 240 mg (09/15/2019), 240 mg (09/29/2019), 240 mg (10/12/2019), 240 mg (10/27/2019), 240 mg (11/10/2019), 240 mg (12/08/2019), 240 mg (12/22/2019), 240 mg (01/19/2020)   for chemotherapy treatment.       CURRENT THERAPY: metastatic renal cell carcinoma, Delton See and Nivolumab  INTERVAL HISTORY: Audrey [name] 80 y.o. female returns for evaluation prior to receiving her treatment with Xgeva and Nivolumab.  She tolerates this well and has no concerns.  Her only issue today which has been persistent is pain in  her feet from peripheral artery disease.  She has not yet started on aspirin and says that she reached put to her cardiologist and hopes to hear back from them today.     Patient Active Problem List   Diagnosis Date Noted   Incisional hernia 06/23/2020   Gastritis and gastroduodenitis 02/26/2020   Ventral hernia without obstruction or gangrene 02/26/2020   Rectal discomfort 02/26/2020   History of colonic polyps 02/26/2020   History of cholecystectomy 07/17/2019   Anemia 10/10/2018   Chronic cholecystitis 09/04/2018   Acute cholecystitis 08/29/2018   Constipation 08/23/2018   Abdominal pain 08/23/2018   Biliary stricture 08/23/2018   Preop cardiovascular exam 08/14/2018   Coronary artery disease involving native coronary artery of native heart without angina pectoris 08/14/2018   Dyslipidemia 08/14/2018   Abnormal LFTs 08/05/2018   Choledocholithiasis 08/05/2018   Dilated bile duct 08/05/2018   History of ERCP 08/05/2018   History of renal cell carcinoma 08/05/2018   Port-A-Cath in place 01/30/2018   Counseling regarding advance care planning and goals of care 01/04/2018   Renal cell carcinoma ([place]) 04/11/2017   Cancer, 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  MEDICAL HISTORY: Past Medical History:  Diagnosis Date   Anemia    Arthritis    lower back, hips, hands   Biliary stricture    Diabetes mellitus ([place])    type 2    Early cataracts, bilateral    Md just watching   Elevated liver enzymes    Family  history of adverse reaction to anesthesia    Daughter hard to wake up   Gallstones    GERD (gastroesophageal reflux disease)    occasional - diet controlled   History of blood transfusion 2018   History of hiatal hernia    HTN (hypertension)    Hyperlipidemia    Hypothyroidism    left renal ca dx'd 2018   renal cancer - left kidney removed, pill chemo x 1 yr    Myocardial infarction ([place]) 1991   no deficits   SVD (spontaneous vaginal delivery)    x 3   Wears glasses     SURGICAL HISTORY: Past Surgical History:  Procedure Laterality Date   BALLOON DILATION N/A 07/23/2018   Procedure: BALLOON DILATION;  Surgeon: Mansouraty, Telford Nab., MD;  Location: [facility];  Service: Gastroenterology;  Laterality: N/A;   BILIARY BRUSHING  08/10/2018   Procedure: BILIARY BRUSHING;  Surgeon: Rush Landmark Telford Nab., MD;  Location: [facility];  Service: Gastroenterology;;   BILIARY BRUSHING  11/30/2018   Procedure: BILIARY BRUSHING;  Surgeon: Irving Copas., MD;  Location: [facility];  Service: Gastroenterology;;   BILIARY DILATION  08/10/2018   Procedure: BILIARY DILATION;  Surgeon: Irving Copas., MD;  Location: [facility];  Service: Gastroenterology;;   BILIARY DILATION  08/27/2018   Procedure: BILIARY DILATION;  Surgeon: Irving Copas., MD;  Location: [facility];  Service: Gastroenterology;;   BILIARY DILATION  01/24/2020   Procedure: BILIARY DILATION;  Surgeon: Irving Copas., MD;  Location: [place];  Service: Gastroenterology;;   BILIARY STENT PLACEMENT  08/10/2018   Procedure: BILIARY STENT PLACEMENT;  Surgeon: Irving Copas., MD;  Location: [facility];  Service: Gastroenterology;;   BILIARY STENT PLACEMENT  08/27/2018   Procedure: BILIARY STENT PLACEMENT;  Surgeon: Irving Copas., MD;  Location: [facility];  Service: Gastroenterology;;   BILIARY STENT PLACEMENT  11/30/2018   Procedure: BILIARY STENT PLACEMENT;  Surgeon: Irving Copas., MD;  Location: [facility];  Service: Gastroenterology;;   BIOPSY  07/23/2018   Procedure: BIOPSY;  Surgeon: Irving Copas., MD;  Location: [place];  Service: Gastroenterology;;   BIOPSY  01/24/2020   Procedure: BIOPSY;  Surgeon: Irving Copas., MD;  Location: [place];  Service: Gastroenterology;;   CHOLECYSTECTOMY N/A  09/02/2018   Procedure: LAPAROSCOPIC CHOLECYSTECTOMY;  Surgeon: Stark Klein, MD;  Location: [place];  Service: General;  Laterality: N/A;   COLONOSCOPY     normal    ENDOSCOPIC RETROGRADE CHOLANGIOPANCREATOGRAPHY (ERCP) WITH PROPOFOL N/A 08/10/2018   Procedure: ENDOSCOPIC RETROGRADE CHOLANGIOPANCREATOGRAPHY (ERCP) WITH PROPOFOL;  Surgeon: Irving Copas., MD;  Location: [place] [facility];  Service: Gastroenterology;  Laterality: N/A;   ENDOSCOPIC RETROGRADE CHOLANGIOPANCREATOGRAPHY (ERCP) WITH PROPOFOL N/A 11/30/2018   Procedure: ENDOSCOPIC RETROGRADE CHOLANGIOPANCREATOGRAPHY (ERCP) WITH PROPOFOL;  Surgeon: Rush Landmark Telford Nab., MD;  Location: [facility];  Service: Gastroenterology;  Laterality: N/A;   ENDOSCOPIC RETROGRADE CHOLANGIOPANCREATOGRAPHY (ERCP) WITH PROPOFOL N/A 01/24/2020   Procedure: ENDOSCOPIC RETROGRADE CHOLANGIOPANCREATOGRAPHY (ERCP) WITH PROPOFOL;  Surgeon: Rush Landmark Telford Nab., MD;  Location: [place];  Service: Gastroenterology;  Laterality: N/A;   ERCP N/A 07/23/2018   Procedure: ENDOSCOPIC RETROGRADE CHOLANGIOPANCREATOGRAPHY (ERCP);  Surgeon: Irving Copas., MD;  Location: [place];  Service: Gastroenterology;  Laterality: N/A;   ERCP N/A 08/27/2018   Procedure: ENDOSCOPIC RETROGRADE CHOLANGIOPANCREATOGRAPHY (ERCP);  Surgeon: Irving Copas., MD;  Location: [facility];  Service: Gastroenterology;  Laterality: N/A;   ESOPHAGOGASTRODUODENOSCOPY N/A 01/24/2020   Procedure: ESOPHAGOGASTRODUODENOSCOPY (EGD);  Surgeon: Irving Copas., MD;  Location:  [facility];  Service: Gastroenterology;  Laterality: N/A;   ESOPHAGOGASTRODUODENOSCOPY (EGD) WITH PROPOFOL N/A 08/27/2018   Procedure: ESOPHAGOGASTRODUODENOSCOPY (EGD) WITH PROPOFOL;  Surgeon: Rush Landmark Telford Nab., MD;  Location: [facility];  Service: Gastroenterology;  Laterality: N/A;   EUS N/A 08/27/2018   Procedure: ESOPHAGEAL ENDOSCOPIC ULTRASOUND (EUS) RADIAL;  Surgeon: Rush Landmark Telford Nab., MD;  Location: [place];  Service: Gastroenterology;  Laterality: N/A;   FINE NEEDLE ASPIRATION  08/27/2018   Procedure: FINE NEEDLE ASPIRATION (FNA) LINEAR;  Surgeon: Irving Copas., MD;  Location: [facility];  Service: Gastroenterology;;   Fatima Blank HERNIA REPAIR N/A 06/23/2020   Procedure: [place];  Surgeon: Felicie Morn, MD;  Location: [facility];  Service: General;  Laterality: N/A;  ROOM 2 STARTING AT 11:00AM FOR 90 MIN   IR IMAGING GUIDED PORT INSERTION  01/08/2018   LAPAROSCOPIC NEPHRECTOMY Left 08/01/2016   Procedure: LAPAROSCOPIC  RADICAL NEPHRECTOMY/ REPAIR OF UMBILICAL HERNIA;  Surgeon: Raynelle Bring, MD;  Location: [facility];  Service: Urology;  Laterality: Left;   REMOVAL OF STONES  07/23/2018   Procedure: REMOVAL OF GALL STONES;  Surgeon: Rush Landmark Telford Nab., MD;  Location: [place];  Service: Gastroenterology;;   REMOVAL OF STONES  08/10/2018   Procedure: REMOVAL OF STONES;  Surgeon: Irving Copas., MD;  Location: [place];  Service: Gastroenterology;;   REMOVAL OF STONES  08/27/2018   Procedure: REMOVAL OF STONES;  Surgeon: Irving Copas., MD;  Location: [place];  Service: Gastroenterology;;   REMOVAL OF STONES  01/24/2020   Procedure: REMOVAL OF STONES;  Surgeon: Irving Copas., MD;  Location: [place];  Service: Gastroenterology;;   [name]  07/23/2018   Procedure: [name];  Surgeon: Irving Copas., MD;  Location: [place];  Service: Gastroenterology;;   Lavell Islam REMOVAL  08/27/2018   Procedure: STENT REMOVAL;  Surgeon: Irving Copas., MD;  Location: [place];  Service: Gastroenterology;;   Lavell Islam REMOVAL  11/30/2018   Procedure: STENT REMOVAL;  Surgeon: Irving Copas., MD;  Location: [place];  Service: Gastroenterology;;   STENT REMOVAL  01/24/2020   Procedure: STENT REMOVAL;  Surgeon: Irving Copas., MD;  Location: [facility];   Service: Gastroenterology;;   UPPER GI ENDOSCOPY     x 1   VAGINAL PROLAPSE REPAIR  11/18/2019   [facility]    SOCIAL HISTORY: Social History   Socioeconomic History   Marital status: Married    Spouse name: Not on file   Number of children: 3   Years of education: Not on file   Highest education level: Not on file  Occupational History   Occupation: retired  Tobacco Use   Smoking status: Former    Packs/day: 1.00    Years: 8.00    Pack years: 8.00    Types: Cigarettes    Quit date: 06/18/1964    Years since quitting: 56.5   Smokeless tobacco: Never  Vaping Use   Vaping Use: Never used  Substance and Sexual Activity   Alcohol use: No   Drug use: No   Sexual activity: Not Currently    Birth control/protection: Post-menopausal  Other Topics Concern   Not on file  Social History Narrative   Married.  Three children   Social Determinants of Health   Financial Resource Strain: Not on file  Food Insecurity: Not on file  Transportation Needs: Not on file  Physical Activity: Not on file  Stress: Not on file  Social Connections: Not on file  Intimate Partner Violence: Not on file    FAMILY HISTORY: Family History  Problem Relation Age of Onset   Heart attack Father 15   Heart disease Brother 32       CABG   Colon cancer Neg Hx    Esophageal cancer Neg Hx    Inflammatory bowel disease Neg Hx    Liver disease Neg Hx    Pancreatic cancer Neg Hx    Rectal cancer Neg Hx    Stomach cancer Neg Hx     Review of Systems  Constitutional:  Negative for appetite change, chills, fatigue, fever and unexpected weight change.  HENT:   Negative for hearing loss, lump/mass and trouble swallowing.   Eyes:  Negative for eye problems and icterus.  Respiratory:  Negative for chest tightness, cough and shortness of breath.   Cardiovascular:  Negative for chest pain, leg swelling and palpitations.  Gastrointestinal:  Negative for abdominal distention, abdominal pain, constipation,  diarrhea, nausea and vomiting.  Endocrine: Negative for hot flashes.  Genitourinary:  Negative for difficulty urinating.   Musculoskeletal:  Negative for arthralgias.  Skin:  Negative for itching and rash.  Neurological:  Negative for dizziness, extremity weakness, headaches and numbness.  Hematological:  Negative for adenopathy. Does not bruise/bleed easily.  Psychiatric/Behavioral:  Negative for depression. The patient is not nervous/anxious.      PHYSICAL EXAMINATION  ECOG PERFORMANCE STATUS: 2 - Symptomatic, <50% confined to bed  Vitals:   12/06/20 0941  BP: 135/71  Pulse: 70  Resp: 17  Temp: 97.6 F (36.4 C)  SpO2: 100%    Physical Exam Constitutional:      General: She is not in acute distress.    Appearance: Normal appearance. She is not toxic-appearing.  HENT:     Head: Normocephalic and atraumatic.  Eyes:     General: No scleral icterus. Cardiovascular:     Rate and Rhythm: Normal rate and regular rhythm.     Pulses: Normal pulses.     Heart sounds: Normal heart sounds.  Pulmonary:     Effort: Pulmonary effort is normal.     Breath sounds: Normal breath sounds.  Abdominal:     General: Abdomen is flat. Bowel sounds are normal. There is no distension.     Palpations: Abdomen is soft.     Tenderness: There is no abdominal tenderness.  Musculoskeletal:        General: No swelling.     Cervical back: Neck supple.  Lymphadenopathy:     Cervical: No cervical adenopathy.  Skin:    General: Skin is warm and dry.     Findings: No rash.  Neurological:     General: No focal deficit present.     Mental Status: She is alert.  Psychiatric:        Mood and Affect: Mood normal.        Behavior: Behavior normal.    LABORATORY DATA:  CBC    Component Value Date/Time   WBC 8.3 12/06/2020 0857   WBC 9.5 07/05/2020 0921   RBC 4.57 12/06/2020 0857   HGB 11.7 (L) 12/06/2020 0857   HGB 11.2 (L) 04/04/2017 0830   HCT 39.0 12/06/2020 0857   HCT 35.2 04/04/2017  0830   PLT 314 12/06/2020 0857   PLT 250 04/04/2017 0830   MCV 85.3 12/06/2020 0857   MCV 107.6 (H) 04/04/2017 0830   MCH 25.6 (L) 12/06/2020 0857   MCHC 30.0 12/06/2020 0857   RDW 16.3 (H) 12/06/2020 [id number]  RDW 14.0 04/04/2017 0830   LYMPHSABS 1.3 12/06/2020 0857   LYMPHSABS 1.6 04/04/2017 0830   MONOABS 0.8 12/06/2020 0857   MONOABS 0.4 04/04/2017 0830   EOSABS 0.1 12/06/2020 0857   EOSABS 0.1 04/04/2017 0830   BASOSABS 0.1 12/06/2020 0857   BASOSABS 0.0 04/04/2017 0830    CMP     Component Value Date/Time   NA 140 12/06/2020 0857   NA 140 04/04/2017 0830   K 4.4 12/06/2020 0857   K 4.1 04/04/2017 0830   CL 112 (H) 12/06/2020 0857   CO2 18 (L) 12/06/2020 0857   CO2 21 (L) 04/04/2017 0830   GLUCOSE 156 (H) 12/06/2020 0857   GLUCOSE 107 04/04/2017 0830   BUN 31 (H) 12/06/2020 0857   BUN 37.0 (H) 04/04/2017 0830   CREATININE 2.22 (H) 12/06/2020 0857   CREATININE 2.2 (H) 04/04/2017 0830   CALCIUM 9.5 12/06/2020 0857   CALCIUM 10.1 04/04/2017 0830   PROT 7.3 12/06/2020 0857   PROT 7.5 04/04/2017 0830   ALBUMIN 3.9 12/06/2020 0857   ALBUMIN 4.0 04/04/2017 0830   AST 24 12/06/2020 0857   AST 19 04/04/2017 0830   ALT 30 12/06/2020 0857   ALT 12 04/04/2017 0830   ALKPHOS 65 12/06/2020 0857   ALKPHOS 65 04/04/2017 0830   BILITOT 0.4 12/06/2020 0857   BILITOT 0.34 04/04/2017 0830   GFRNONAA 22 (L) 12/06/2020 0857   GFRAA 30 (L) 01/19/2020 0835      ASSESSMENT and PLAN:   Cancer, metastatic to bone [facility]) Hollyann is here today for f/u of her metastatic renal cell carcinoma being treated with nivolumab and xgeva.  She is tolerating treatment well.  Her kidney function is stable, and she will proceed with treatment as she has been.  She will f/u with cardiology about taking aspirin if she doesn't hear from them.  She will return in 3 weeks for labs and infusion, and in 6 weeks for labs, f/u with [name], and infusion.  She knows to call for any questions that may arise  between now and her next appointment.  We are happy to see her sooner if needed.   No orders of the defined types were placed in this encounter.   All questions were answered. The patient knows to call the clinic with any problems, questions or concerns. We can certainly see the patient much sooner if necessary.  Total encounter time: 30 minutes of face to face visit time, chart review, lab review, and documentation of the encounter.   Wilber Bihari, NP 12/06/20 12:18 PM Medical Oncology and Hematology [facility] [place], [place] [address] Tel. [phone]    Fax. [phone]  *Total Encounter Time as defined by the Centers for Medicare and Medicaid Services includes, in addition to the face-to-face time of a patient visit (documented in the note above) non-face-to-face time: obtaining and reviewing outside history, ordering and reviewing medications, tests or procedures, care coordination (communications with other health care professionals or caregivers) and documentation in the medical record.

## 2020-12-07 ENCOUNTER — Encounter: Payer: Self-pay | Admitting: Hematology

## 2020-12-19 ENCOUNTER — Other Ambulatory Visit: Payer: Self-pay

## 2020-12-19 DIAGNOSIS — C649 Malignant neoplasm of unspecified kidney, except renal pelvis: Secondary | ICD-10-CM

## 2020-12-20 ENCOUNTER — Other Ambulatory Visit: Payer: Self-pay | Admitting: Hematology

## 2020-12-20 ENCOUNTER — Inpatient Hospital Stay: Payer: Medicare Other

## 2020-12-20 ENCOUNTER — Other Ambulatory Visit: Payer: Self-pay

## 2020-12-20 VITALS — BP 135/57 | HR 56 | Temp 97.7°F | Resp 18 | Wt 129.2 lb

## 2020-12-20 DIAGNOSIS — C642 Malignant neoplasm of left kidney, except renal pelvis: Secondary | ICD-10-CM

## 2020-12-20 DIAGNOSIS — Z5112 Encounter for antineoplastic immunotherapy: Secondary | ICD-10-CM | POA: Diagnosis not present

## 2020-12-20 DIAGNOSIS — Z95828 Presence of other vascular implants and grafts: Secondary | ICD-10-CM

## 2020-12-20 DIAGNOSIS — C649 Malignant neoplasm of unspecified kidney, except renal pelvis: Secondary | ICD-10-CM

## 2020-12-20 DIAGNOSIS — Z7189 Other specified counseling: Secondary | ICD-10-CM

## 2020-12-20 DIAGNOSIS — C7951 Secondary malignant neoplasm of bone: Secondary | ICD-10-CM

## 2020-12-20 LAB — CBC WITH DIFFERENTIAL (CANCER CENTER ONLY)
Abs Immature Granulocytes: 0.06 10*3/uL (ref 0.00–0.07)
Basophils Absolute: 0.1 10*3/uL (ref 0.0–0.1)
Basophils Relative: 1 %
Eosinophils Absolute: 0.2 10*3/uL (ref 0.0–0.5)
Eosinophils Relative: 2 %
HCT: 33.8 % — ABNORMAL LOW (ref 36.0–46.0)
Hemoglobin: 10.4 g/dL — ABNORMAL LOW (ref 12.0–15.0)
Immature Granulocytes: 1 %
Lymphocytes Relative: 15 %
Lymphs Abs: 1.4 10*3/uL (ref 0.7–4.0)
MCH: 25.8 pg — ABNORMAL LOW (ref 26.0–34.0)
MCHC: 30.8 g/dL (ref 30.0–36.0)
MCV: 83.9 fL (ref 80.0–100.0)
Monocytes Absolute: 1 10*3/uL (ref 0.1–1.0)
Monocytes Relative: 11 %
Neutro Abs: 6.3 10*3/uL (ref 1.7–7.7)
Neutrophils Relative %: 70 %
Platelet Count: 280 10*3/uL (ref 150–400)
RBC: 4.03 MIL/uL (ref 3.87–5.11)
RDW: 16.5 % — ABNORMAL HIGH (ref 11.5–15.5)
WBC Count: 9 10*3/uL (ref 4.0–10.5)
nRBC: 0 % (ref 0.0–0.2)

## 2020-12-20 LAB — CMP (CANCER CENTER ONLY)
ALT: 17 U/L (ref 0–44)
AST: 19 U/L (ref 15–41)
Albumin: 3.5 g/dL (ref 3.5–5.0)
Alkaline Phosphatase: 56 U/L (ref 38–126)
Anion gap: 9 (ref 5–15)
BUN: 34 mg/dL — ABNORMAL HIGH (ref 8–23)
CO2: 15 mmol/L — ABNORMAL LOW (ref 22–32)
Calcium: 8.9 mg/dL (ref 8.9–10.3)
Chloride: 114 mmol/L — ABNORMAL HIGH (ref 98–111)
Creatinine: 1.98 mg/dL — ABNORMAL HIGH (ref 0.44–1.00)
GFR, Estimated: 25 mL/min — ABNORMAL LOW (ref >60)
Glucose, Bld: 128 mg/dL — ABNORMAL HIGH (ref 70–99)
Potassium: 3.8 mmol/L (ref 3.5–5.1)
Sodium: 138 mmol/L (ref 135–145)
Total Bilirubin: 0.4 mg/dL (ref 0.3–1.2)
Total Protein: 6.4 g/dL — ABNORMAL LOW (ref 6.5–8.1)

## 2020-12-20 MED ORDER — SODIUM CHLORIDE 0.9% FLUSH
10.0000 mL | Freq: Once | INTRAVENOUS | Status: AC
  Administered 2020-12-20: 10 mL

## 2020-12-20 MED ORDER — HEPARIN SOD (PORK) LOCK FLUSH 100 UNIT/ML IV SOLN
500.0000 [IU] | Freq: Once | INTRAVENOUS | Status: AC | PRN
  Administered 2020-12-20: 500 [IU]

## 2020-12-20 MED ORDER — SODIUM CHLORIDE 0.9% FLUSH
10.0000 mL | INTRAVENOUS | Status: DC | PRN
  Administered 2020-12-20: 10 mL

## 2020-12-20 MED ORDER — SODIUM CHLORIDE 0.9 % IV SOLN: INTRAVENOUS | Status: AC

## 2020-12-20 MED ORDER — SODIUM CHLORIDE 0.9 % IV SOLN
240.0000 mg | Freq: Once | INTRAVENOUS | Status: AC
  Administered 2020-12-20: 240 mg via INTRAVENOUS
  Filled 2020-12-20: qty 24

## 2020-12-20 NOTE — Patient Instructions (Signed)
[facility]COLOG[facility]  Discharge Instructions: ?Thank you for choosing [facility] to provide your oncology and hematology care.  ? ?If you have a lab appointment with the Cancer Center, please go directly to the Cancer Center and check in at the registration area. ?  ?Wear comfortable clothing and clothing appropriate for easy access to any Portacath or PICC line.  ? ?We strive to give you quality time with your provider. You may need to reschedule your appointment if you arrive late (15 or more minutes).  Arriving late affects you and other patients whose appointments are after yours.  Also, if you miss three or more appointments without notifying the office, you may be dismissed from the clinic at the provider?s discretion.    ?  ?For prescription refill requests, have your pharmacy contact our office and allow 72 hours for refills to be completed.   ? ?Today you received the following chemotherapy and/or immunotherapy agents Opdivo    ?  ?To help prevent nausea and vomiting after your treatment, we encourage you to take your nausea medication as directed. ? ?BELOW ARE SYMPTOMS THAT SHOULD BE REPORTED IMMEDIATELY: ?*FEVER GREATER THAN 100.4 F (38 ?C) OR HIGHER ?*CHILLS OR SWEATING ?*NAUSEA AND VOMITING THAT IS NOT CONTROLLED WITH YOUR NAUSEA MEDICATION ?*UNUSUAL SHORTNESS OF BREATH ?*UNUSUAL BRUISING OR BLEEDING ?*URINARY PROBLEMS (pain or burning when urinating, or frequent urination) ?*BOWEL PROBLEMS (unusual diarrhea, constipation, pain near the anus) ?TENDERNESS IN MOUTH AND THROAT WITH OR WITHOUT PRESENCE OF ULCERS (sore throat, sores in mouth, or a toothache) ?UNUSUAL RASH, SWELLING OR PAIN  ?UNUSUAL VAGINAL DISCHARGE OR ITCHING  ? ?Items with * indicate a potential emergency and should be followed up as soon as possible or go to the Emergency Department if any problems should occur. ? ?Please show the CHEMOTHERAPY ALERT CARD or IMMUNOTHERAPY ALERT CARD at check-in to the  Emergency Department and triage nurse. ? ?Should you have questions after your visit or need to cancel or reschedule your appointment, please contact [facility]  Dept: [phone]  and follow the prompts.  Office hours are 8:00 a.m. to 4:30 p.m. Monday - Friday. Please note that voicemails left after 4:00 p.m. may not be returned until the following business day.  We are closed weekends and major holidays. You have access to a nurse at all times for urgent questions. Please call the main number to the clinic Dept: [phone] and follow the prompts. ? ? ?For any non-urgent questions, you may also contact your provider using MyChart. We now offer e-Visits for anyone 18 and older to request care online for non-urgent symptoms. For details visit [email or web address]. ?  ?Also download the MyChart app! Go to the app store, search "MyChart", open the app, select [facility], and log in with your MyChart username and password. ? ?Due to Covid, a mask is required upon entering the hospital/clinic. If you do not have a mask, one will be given to you upon arrival. For doctor visits, patients may have 1 support person aged 18 or older with them. For treatment visits, patients cannot have anyone with them due to current Covid guidelines and our immunocompromised population.  ? ?

## 2020-12-20 NOTE — Progress Notes (Signed)
[name] made aware of creatinine of 1.98, okay to proceed with tx as scheduled

## 2021-01-03 ENCOUNTER — Inpatient Hospital Stay: Payer: Medicare Other

## 2021-01-03 ENCOUNTER — Inpatient Hospital Stay: Payer: Medicare Other | Attending: Hematology

## 2021-01-03 ENCOUNTER — Other Ambulatory Visit: Payer: Self-pay | Admitting: Hematology

## 2021-01-03 ENCOUNTER — Other Ambulatory Visit: Payer: Self-pay

## 2021-01-03 VITALS — BP 153/54 | HR 54 | Temp 97.6°F | Resp 16 | Wt 130.0 lb

## 2021-01-03 DIAGNOSIS — Z95828 Presence of other vascular implants and grafts: Secondary | ICD-10-CM

## 2021-01-03 DIAGNOSIS — Z7189 Other specified counseling: Secondary | ICD-10-CM

## 2021-01-03 DIAGNOSIS — C649 Malignant neoplasm of unspecified kidney, except renal pelvis: Secondary | ICD-10-CM

## 2021-01-03 DIAGNOSIS — Z5112 Encounter for antineoplastic immunotherapy: Secondary | ICD-10-CM | POA: Diagnosis present

## 2021-01-03 DIAGNOSIS — C7951 Secondary malignant neoplasm of bone: Secondary | ICD-10-CM

## 2021-01-03 DIAGNOSIS — C642 Malignant neoplasm of left kidney, except renal pelvis: Secondary | ICD-10-CM | POA: Diagnosis not present

## 2021-01-03 DIAGNOSIS — Z79899 Other long term (current) drug therapy: Secondary | ICD-10-CM | POA: Insufficient documentation

## 2021-01-03 LAB — CMP (CANCER CENTER ONLY)
ALT: 28 U/L (ref 0–44)
AST: 27 U/L (ref 15–41)
Albumin: 3.7 g/dL (ref 3.5–5.0)
Alkaline Phosphatase: 71 U/L (ref 38–126)
Anion gap: 9 (ref 5–15)
BUN: 28 mg/dL — ABNORMAL HIGH (ref 8–23)
CO2: 16 mmol/L — ABNORMAL LOW (ref 22–32)
Calcium: 8.7 mg/dL — ABNORMAL LOW (ref 8.9–10.3)
Chloride: 113 mmol/L — ABNORMAL HIGH (ref 98–111)
Creatinine: 1.88 mg/dL — ABNORMAL HIGH (ref 0.44–1.00)
GFR, Estimated: 27 mL/min — ABNORMAL LOW (ref >60)
Glucose, Bld: 136 mg/dL — ABNORMAL HIGH (ref 70–99)
Potassium: 4 mmol/L (ref 3.5–5.1)
Sodium: 138 mmol/L (ref 135–145)
Total Bilirubin: 0.4 mg/dL (ref 0.3–1.2)
Total Protein: 7.1 g/dL (ref 6.5–8.1)

## 2021-01-03 LAB — CBC WITH DIFFERENTIAL (CANCER CENTER ONLY)
Abs Immature Granulocytes: 0.06 10*3/uL (ref 0.00–0.07)
Basophils Absolute: 0.1 10*3/uL (ref 0.0–0.1)
Basophils Relative: 1 %
Eosinophils Absolute: 0.2 10*3/uL (ref 0.0–0.5)
Eosinophils Relative: 2 %
HCT: 38.5 % (ref 36.0–46.0)
Hemoglobin: 11.6 g/dL — ABNORMAL LOW (ref 12.0–15.0)
Immature Granulocytes: 1 %
Lymphocytes Relative: 17 %
Lymphs Abs: 1.4 10*3/uL (ref 0.7–4.0)
MCH: 25.5 pg — ABNORMAL LOW (ref 26.0–34.0)
MCHC: 30.1 g/dL (ref 30.0–36.0)
MCV: 84.6 fL (ref 80.0–100.0)
Monocytes Absolute: 0.8 10*3/uL (ref 0.1–1.0)
Monocytes Relative: 10 %
Neutro Abs: 5.8 10*3/uL (ref 1.7–7.7)
Neutrophils Relative %: 69 %
Platelet Count: 303 10*3/uL (ref 150–400)
RBC: 4.55 MIL/uL (ref 3.87–5.11)
RDW: 16.9 % — ABNORMAL HIGH (ref 11.5–15.5)
WBC Count: 8.3 10*3/uL (ref 4.0–10.5)
nRBC: 0 % (ref 0.0–0.2)

## 2021-01-03 MED ORDER — ALTEPLASE 2 MG IJ SOLR
2.0000 mg | Freq: Once | INTRAMUSCULAR | Status: AC
  Administered 2021-01-03: 2 mg
  Filled 2021-01-03: qty 2

## 2021-01-03 MED ORDER — HEPARIN SOD (PORK) LOCK FLUSH 100 UNIT/ML IV SOLN
500.0000 [IU] | Freq: Once | INTRAVENOUS | Status: AC | PRN
  Administered 2021-01-03: 500 [IU]

## 2021-01-03 MED ORDER — SODIUM CHLORIDE 0.9% FLUSH
10.0000 mL | INTRAVENOUS | Status: DC | PRN
  Administered 2021-01-03: 10 mL

## 2021-01-03 MED ORDER — SODIUM CHLORIDE 0.9% FLUSH
10.0000 mL | Freq: Once | INTRAVENOUS | Status: AC
  Administered 2021-01-03: 10 mL

## 2021-01-03 MED ORDER — SODIUM CHLORIDE 0.9 % IV SOLN: INTRAVENOUS | Status: AC

## 2021-01-03 MED ORDER — SODIUM CHLORIDE 0.9 % IV SOLN
240.0000 mg | Freq: Once | INTRAVENOUS | Status: AC
  Administered 2021-01-03: 240 mg via INTRAVENOUS
  Filled 2021-01-03: qty 24

## 2021-01-03 NOTE — Progress Notes (Signed)
Cathflow given to pt by Yolonda Kida RN due to no blood return.

## 2021-01-03 NOTE — Patient Instructions (Signed)
[facility]  Discharge Instructions: Thank you for choosing [facility] to provide your oncology and hematology care.   If you have a lab appointment with the [facility], please go directly to the [facility] and check in at the registration area.   Wear comfortable clothing and clothing appropriate for easy access to any Portacath or PICC line.   We strive to give you quality time with your provider. You may need to reschedule your appointment if you arrive late (15 or more minutes).  Arriving late affects you and other patients whose appointments are after yours.  Also, if you miss three or more appointments without notifying the office, you may be dismissed from the clinic at the provider's discretion.      For prescription refill requests, have your pharmacy contact our office and allow 72 hours for refills to be completed.    Today you received the following chemotherapy and/or immunotherapy agents :  Novolumab      To help prevent nausea and vomiting after your treatment, we encourage you to take your nausea medication as directed.  BELOW ARE SYMPTOMS THAT SHOULD BE REPORTED IMMEDIATELY: *FEVER GREATER THAN 100.4 F (38 C) OR HIGHER *CHILLS OR SWEATING *NAUSEA AND VOMITING THAT IS NOT CONTROLLED WITH YOUR NAUSEA MEDICATION *UNUSUAL SHORTNESS OF BREATH *UNUSUAL BRUISING OR BLEEDING *URINARY PROBLEMS (pain or burning when urinating, or frequent urination) *BOWEL PROBLEMS (unusual diarrhea, constipation, pain near the anus) TENDERNESS IN MOUTH AND THROAT WITH OR WITHOUT PRESENCE OF ULCERS (sore throat, sores in mouth, or a toothache) UNUSUAL RASH, SWELLING OR PAIN  UNUSUAL VAGINAL DISCHARGE OR ITCHING   Items with * indicate a potential emergency and should be followed up as soon as possible or go to the Emergency Department if any problems should occur.  Please show the CHEMOTHERAPY ALERT CARD or IMMUNOTHERAPY ALERT CARD at check-in  to the Emergency Department and triage nurse.  Should you have questions after your visit or need to cancel or reschedule your appointment, please contact [facility]  Dept: [phone]  and follow the prompts.  Office hours are 8:00 a.m. to 4:30 p.m. Monday - Friday. Please note that voicemails left after 4:00 p.m. may not be returned until the following business day.  We are closed weekends and major holidays. You have access to a nurse at all times for urgent questions. Please call the main number to the clinic Dept: [phone] and follow the prompts.   For any non-urgent questions, you may also contact your provider using MyChart. We now offer e-Visits for anyone 81 and older to request care online for non-urgent symptoms. For details visit [email or web address].   Also download the MyChart app! Go to the app store, search "MyChart", open the app, select [facility], and log in with your MyChart username and password.  Due to Covid, a mask is required upon entering the hospital/clinic. If you do not have a mask, one will be given to you upon arrival. For doctor visits, patients may have 1 support person aged 60 or older with them. For treatment visits, patients cannot have anyone with them due to current Covid guidelines and our immunocompromised population.

## 2021-01-03 NOTE — Progress Notes (Signed)
OK to treat if Creat < 2.5 per Dr Irene Limbo.

## 2021-01-18 ENCOUNTER — Inpatient Hospital Stay: Payer: Medicare Other

## 2021-01-18 ENCOUNTER — Other Ambulatory Visit: Payer: Self-pay

## 2021-01-18 ENCOUNTER — Inpatient Hospital Stay (HOSPITAL_BASED_OUTPATIENT_CLINIC_OR_DEPARTMENT_OTHER): Payer: Medicare Other | Admitting: Hematology

## 2021-01-18 VITALS — BP 137/64 | HR 59 | Temp 97.7°F | Resp 18 | Ht 59.0 in | Wt 129.5 lb

## 2021-01-18 DIAGNOSIS — Z7189 Other specified counseling: Secondary | ICD-10-CM

## 2021-01-18 DIAGNOSIS — C642 Malignant neoplasm of left kidney, except renal pelvis: Secondary | ICD-10-CM

## 2021-01-18 DIAGNOSIS — Z5112 Encounter for antineoplastic immunotherapy: Secondary | ICD-10-CM | POA: Diagnosis not present

## 2021-01-18 DIAGNOSIS — Z95828 Presence of other vascular implants and grafts: Secondary | ICD-10-CM

## 2021-01-18 DIAGNOSIS — C649 Malignant neoplasm of unspecified kidney, except renal pelvis: Secondary | ICD-10-CM

## 2021-01-18 DIAGNOSIS — C7951 Secondary malignant neoplasm of bone: Secondary | ICD-10-CM

## 2021-01-18 LAB — CMP (CANCER CENTER ONLY)
ALT: 20 U/L (ref 0–44)
AST: 20 U/L (ref 15–41)
Albumin: 3.5 g/dL (ref 3.5–5.0)
Alkaline Phosphatase: 63 U/L (ref 38–126)
Anion gap: 10 (ref 5–15)
BUN: 29 mg/dL — ABNORMAL HIGH (ref 8–23)
CO2: 16 mmol/L — ABNORMAL LOW (ref 22–32)
Calcium: 9 mg/dL (ref 8.9–10.3)
Chloride: 115 mmol/L — ABNORMAL HIGH (ref 98–111)
Creatinine: 2.05 mg/dL — ABNORMAL HIGH (ref 0.44–1.00)
GFR, Estimated: 24 mL/min — ABNORMAL LOW (ref >60)
Glucose, Bld: 140 mg/dL — ABNORMAL HIGH (ref 70–99)
Potassium: 3.9 mmol/L (ref 3.5–5.1)
Sodium: 141 mmol/L (ref 135–145)
Total Bilirubin: 0.4 mg/dL (ref 0.3–1.2)
Total Protein: 6.4 g/dL — ABNORMAL LOW (ref 6.5–8.1)

## 2021-01-18 LAB — CBC WITH DIFFERENTIAL (CANCER CENTER ONLY)
Abs Immature Granulocytes: 0.08 10*3/uL — ABNORMAL HIGH (ref 0.00–0.07)
Basophils Absolute: 0.1 10*3/uL (ref 0.0–0.1)
Basophils Relative: 1 %
Eosinophils Absolute: 0.2 10*3/uL (ref 0.0–0.5)
Eosinophils Relative: 2 %
HCT: 34.3 % — ABNORMAL LOW (ref 36.0–46.0)
Hemoglobin: 10.5 g/dL — ABNORMAL LOW (ref 12.0–15.0)
Immature Granulocytes: 1 %
Lymphocytes Relative: 17 %
Lymphs Abs: 1.3 10*3/uL (ref 0.7–4.0)
MCH: 25.1 pg — ABNORMAL LOW (ref 26.0–34.0)
MCHC: 30.6 g/dL (ref 30.0–36.0)
MCV: 81.9 fL (ref 80.0–100.0)
Monocytes Absolute: 0.9 10*3/uL (ref 0.1–1.0)
Monocytes Relative: 11 %
Neutro Abs: 5.1 10*3/uL (ref 1.7–7.7)
Neutrophils Relative %: 68 %
Platelet Count: 309 10*3/uL (ref 150–400)
RBC: 4.19 MIL/uL (ref 3.87–5.11)
RDW: 16.6 % — ABNORMAL HIGH (ref 11.5–15.5)
WBC Count: 7.7 10*3/uL (ref 4.0–10.5)
nRBC: 0 % (ref 0.0–0.2)

## 2021-01-18 LAB — TSH: TSH: 0.73 u[IU]/mL (ref 0.308–3.960)

## 2021-01-18 MED ORDER — DENOSUMAB 120 MG/1.7ML ~~LOC~~ SOLN
120.0000 mg | Freq: Once | SUBCUTANEOUS | Status: AC
  Administered 2021-01-18: 120 mg via SUBCUTANEOUS
  Filled 2021-01-18: qty 1.7

## 2021-01-18 MED ORDER — SODIUM CHLORIDE 0.9 % IV SOLN: INTRAVENOUS | Status: AC

## 2021-01-18 MED ORDER — SODIUM CHLORIDE 0.9% FLUSH
10.0000 mL | INTRAVENOUS | Status: DC | PRN
  Administered 2021-01-18: 10 mL

## 2021-01-18 MED ORDER — SODIUM CHLORIDE 0.9 % IV SOLN
240.0000 mg | Freq: Once | INTRAVENOUS | Status: AC
  Administered 2021-01-18: 240 mg via INTRAVENOUS
  Filled 2021-01-18: qty 24

## 2021-01-18 MED ORDER — HEPARIN SOD (PORK) LOCK FLUSH 100 UNIT/ML IV SOLN
500.0000 [IU] | Freq: Once | INTRAVENOUS | Status: AC | PRN
  Administered 2021-01-18: 500 [IU]

## 2021-01-18 MED ORDER — SODIUM CHLORIDE 0.9% FLUSH
10.0000 mL | Freq: Once | INTRAVENOUS | Status: AC
  Administered 2021-01-18: 10 mL

## 2021-01-22 ENCOUNTER — Telehealth: Payer: Self-pay | Admitting: Hematology

## 2021-01-22 NOTE — Telephone Encounter (Signed)
Per 10/3 sch msg and pt request, appts have been rescheduled, pt has been called and confirmed appts

## 2021-01-24 NOTE — Progress Notes (Signed)
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[place])   01/02/2018 -  Chemotherapy   Patient is on Treatment Plan : RENAL CELL CARCINOMA NIVOLUMAB Q14D     Renal cell carcinoma ([place])  04/11/2017 Initial Diagnosis   Renal cell carcinoma ([place])   01/02/2018 -  Chemotherapy   The patient had nivolumab (OPDIVO) 240 mg in sodium chloride 0.9 % 100 mL chemo infusion, 240 mg, Intravenous, Once, 47 of 50 cycles Administration: 240 mg (01/02/2018), 240 mg (01/15/2018), 240 mg (01/30/2018), 240 mg (02/13/2018), 240 mg (02/27/2018), 240 mg (03/13/2018), 240 mg (03/27/2018), 240 mg (04/10/2018), 240 mg (04/24/2018), 240 mg (05/08/2018), 240 mg (05/22/2018), 240 mg (06/05/2018), 240 mg (06/19/2018), 240 mg (08/28/2018), 240 mg (09/11/2018), 240 mg (10/09/2018), 240 mg (10/22/2018), 240 mg (11/06/2018), 240 mg (11/20/2018), 240 mg (12/04/2018), 240 mg (12/18/2018), 240 mg (01/01/2019), 240 mg (01/15/2019), 240 mg (01/29/2019), 240 mg (02/12/2019), 240 mg (02/26/2019), 240 mg (03/12/2019), 240 mg (03/26/2019), 240 mg (04/09/2019), 240 mg (04/28/2019), 240 mg (05/12/2019), 240 mg (05/27/2019), 240 mg (06/09/2019), 240 mg (06/23/2019), 240 mg (07/07/2019), 240 mg (07/21/2019), 240 mg (08/04/2019), 240 mg (08/18/2019), 240 mg (09/03/2019), 240 mg (09/15/2019), 240 mg (09/29/2019), 240 mg (10/12/2019), 240 mg (10/27/2019), 240 mg (11/10/2019), 240 mg (12/08/2019), 240 mg (12/22/2019), 240 mg (01/19/2020)  for chemotherapy treatment.      CURRENT THERAPY: metastatic renal cell carcinoma, Delton See and Nivolumab  INTERVAL HISTORY:  Audrey [name] 80 y.o. female returns for evaluation prior to receiving her treatment with Delton See and Nivolumab   She notes no acute new symptoms.  No fevers no chills no shortness of breath  no diarrhea. Continues to have some intermittent leg cramps from her peripheral arterial disease and neuropathy. Diabetes is reasonably controlled. No new toxicities from her treatment. Labs today were reviewed with the patient.  Patient Active Problem List   Diagnosis Date Noted   Incisional hernia 06/23/2020   Gastritis and gastroduodenitis 02/26/2020   Ventral hernia without obstruction or gangrene 02/26/2020   Rectal discomfort 02/26/2020   History of colonic polyps 02/26/2020   History of cholecystectomy 07/17/2019   Anemia 10/10/2018   Chronic cholecystitis 09/04/2018   Acute cholecystitis 08/29/2018   Constipation 08/23/2018   Abdominal pain 08/23/2018   Biliary stricture 08/23/2018   Preop cardiovascular exam 08/14/2018   Coronary artery disease involving native coronary artery of native heart without angina pectoris 08/14/2018   Dyslipidemia 08/14/2018   Abnormal LFTs 08/05/2018   Choledocholithiasis 08/05/2018   Dilated bile duct 08/05/2018   History of ERCP 08/05/2018   History of renal cell carcinoma 08/05/2018   Port-A-Cath in place 01/30/2018   Counseling regarding advance care planning and goals of care 01/04/2018   Renal cell carcinoma ([place]) 04/11/2017   Cancer, 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  MEDICAL HISTORY: Past Medical History:  Diagnosis Date   Anemia    Arthritis    lower back, hips, hands   Biliary stricture    Diabetes mellitus ([place])    type 2    Early cataracts, bilateral    Md just watching   Elevated liver enzymes    Family history of adverse reaction to  anesthesia    Daughter hard to wake up   Gallstones    GERD (gastroesophageal reflux disease)    occasional - diet controlled   History of blood transfusion 2018   History of hiatal hernia    HTN (hypertension)    Hyperlipidemia    Hypothyroidism    left renal ca dx'd 2018    renal cancer - left kidney removed, pill chemo x 1 yr   Myocardial infarction ([place]) 1991   no deficits   SVD (spontaneous vaginal delivery)    x 3   Wears glasses     SURGICAL HISTORY: Past Surgical History:  Procedure Laterality Date   BALLOON DILATION N/A 07/23/2018   Procedure: BALLOON DILATION;  Surgeon: Mansouraty, Telford Nab., MD;  Location: [facility];  Service: Gastroenterology;  Laterality: N/A;   BILIARY BRUSHING  08/10/2018   Procedure: BILIARY BRUSHING;  Surgeon: Rush Landmark Telford Nab., MD;  Location: [facility];  Service: Gastroenterology;;   BILIARY BRUSHING  11/30/2018   Procedure: BILIARY BRUSHING;  Surgeon: Irving Copas., MD;  Location: [facility];  Service: Gastroenterology;;   BILIARY DILATION  08/10/2018   Procedure: BILIARY DILATION;  Surgeon: Irving Copas., MD;  Location: [facility];  Service: Gastroenterology;;   BILIARY DILATION  08/27/2018   Procedure: BILIARY DILATION;  Surgeon: Irving Copas., MD;  Location: [facility];  Service: Gastroenterology;;   BILIARY DILATION  01/24/2020   Procedure: BILIARY DILATION;  Surgeon: Irving Copas., MD;  Location: [place];  Service: Gastroenterology;;   BILIARY STENT PLACEMENT  08/10/2018   Procedure: BILIARY STENT PLACEMENT;  Surgeon: Irving Copas., MD;  Location: [facility];  Service: Gastroenterology;;   BILIARY STENT PLACEMENT  08/27/2018   Procedure: BILIARY STENT PLACEMENT;  Surgeon: Irving Copas., MD;  Location: [facility];  Service: Gastroenterology;;   BILIARY STENT PLACEMENT  11/30/2018   Procedure: BILIARY STENT PLACEMENT;  Surgeon: Irving Copas., MD;  Location: [facility];  Service: Gastroenterology;;   BIOPSY  07/23/2018   Procedure: BIOPSY;  Surgeon: Irving Copas., MD;  Location: [facility];  Service: Gastroenterology;;   BIOPSY  01/24/2020   Procedure: BIOPSY;  Surgeon: Irving Copas., MD;  Location: [facility];   Service: Gastroenterology;;   CHOLECYSTECTOMY N/A 09/02/2018   Procedure: LAPAROSCOPIC CHOLECYSTECTOMY;  Surgeon: Stark Klein, MD;  Location: [place];  Service: General;  Laterality: N/A;   COLONOSCOPY     normal    ENDOSCOPIC RETROGRADE CHOLANGIOPANCREATOGRAPHY (ERCP) WITH PROPOFOL N/A 08/10/2018   Procedure: ENDOSCOPIC RETROGRADE CHOLANGIOPANCREATOGRAPHY (ERCP) WITH PROPOFOL;  Surgeon: Irving Copas., MD;  Location: [facility];  Service: Gastroenterology;  Laterality: N/A;   ENDOSCOPIC RETROGRADE CHOLANGIOPANCREATOGRAPHY (ERCP) WITH PROPOFOL N/A 11/30/2018   Procedure: ENDOSCOPIC RETROGRADE CHOLANGIOPANCREATOGRAPHY (ERCP) WITH PROPOFOL;  Surgeon: Rush Landmark Telford Nab., MD;  Location: [place];  Service: Gastroenterology;  Laterality: N/A;   ENDOSCOPIC RETROGRADE CHOLANGIOPANCREATOGRAPHY (ERCP) WITH PROPOFOL N/A 01/24/2020   Procedure: ENDOSCOPIC RETROGRADE CHOLANGIOPANCREATOGRAPHY (ERCP) WITH PROPOFOL;  Surgeon: Rush Landmark Telford Nab., MD;  Location: [facility];  Service: Gastroenterology;  Laterality: N/A;   ERCP N/A 07/23/2018   Procedure: ENDOSCOPIC RETROGRADE CHOLANGIOPANCREATOGRAPHY (ERCP);  Surgeon: Irving Copas., MD;  Location: [facility];  Service: Gastroenterology;  Laterality: N/A;   ERCP N/A 08/27/2018   Procedure: ENDOSCOPIC RETROGRADE CHOLANGIOPANCREATOGRAPHY (ERCP);  Surgeon: Irving Copas., MD;  Location: [facility];  Service: Gastroenterology;  Laterality: N/A;   ESOPHAGOGASTRODUODENOSCOPY N/A 01/24/2020   Procedure: ESOPHAGOGASTRODUODENOSCOPY (EGD);  Surgeon: Irving Copas., MD;  Location: [facility];  Service: Gastroenterology;  Laterality: N/A;   ESOPHAGOGASTRODUODENOSCOPY (EGD) WITH PROPOFOL N/A 08/27/2018   Procedure: ESOPHAGOGASTRODUODENOSCOPY (EGD) WITH PROPOFOL;  Surgeon: Rush Landmark Telford Nab., MD;  Location: [place];  Service: Gastroenterology;  Laterality: N/A;   EUS N/A 08/27/2018   Procedure: ESOPHAGEAL ENDOSCOPIC  ULTRASOUND (EUS) RADIAL;  Surgeon: Rush Landmark Telford Nab., MD;  Location: [facility];  Service: Gastroenterology;  Laterality: N/A;   FINE NEEDLE ASPIRATION  08/27/2018   Procedure: FINE NEEDLE ASPIRATION (FNA) LINEAR;  Surgeon: Irving Copas., MD;  Location: [place];  Service: Gastroenterology;;   Fatima Blank HERNIA REPAIR N/A 06/23/2020   Procedure: [place];  Surgeon: Felicie Morn, MD;  Location: [facility];  Service: General;  Laterality: N/A;  ROOM 2 STARTING AT 11:00AM FOR 90 MIN   IR IMAGING GUIDED PORT INSERTION  01/08/2018   LAPAROSCOPIC NEPHRECTOMY Left 08/01/2016   Procedure: LAPAROSCOPIC  RADICAL NEPHRECTOMY/ REPAIR OF UMBILICAL HERNIA;  Surgeon: Raynelle Bring, MD;  Location: [facility];  Service: Urology;  Laterality: Left;   REMOVAL OF STONES  07/23/2018   Procedure: REMOVAL OF GALL STONES;  Surgeon: Rush Landmark Telford Nab., MD;  Location: [facility];  Service: Gastroenterology;;   REMOVAL OF STONES  08/10/2018   Procedure: REMOVAL OF STONES;  Surgeon: Irving Copas., MD;  Location: [facility];  Service: Gastroenterology;;   REMOVAL OF STONES  08/27/2018   Procedure: REMOVAL OF STONES;  Surgeon: Irving Copas., MD;  Location: [facility];  Service: Gastroenterology;;   REMOVAL OF STONES  01/24/2020   Procedure: REMOVAL OF STONES;  Surgeon: Irving Copas., MD;  Location: [place] [facility];  Service: Gastroenterology;;   [name]  07/23/2018   Procedure: [name];  Surgeon: Irving Copas., MD;  Location: [facility];  Service: Gastroenterology;;   Lavell Islam REMOVAL  08/27/2018   Procedure: STENT REMOVAL;  Surgeon: Irving Copas., MD;  Location: [facility];  Service: Gastroenterology;;   Lavell Islam REMOVAL  11/30/2018   Procedure: STENT REMOVAL;  Surgeon: Irving Copas., MD;  Location: [place];  Service: Gastroenterology;;   STENT REMOVAL  01/24/2020   Procedure: STENT REMOVAL;  Surgeon:  Irving Copas., MD;  Location: [facility];  Service: Gastroenterology;;   UPPER GI ENDOSCOPY     x 1   VAGINAL PROLAPSE REPAIR  11/18/2019   [facility]    SOCIAL HISTORY: Social History   Socioeconomic History   Marital status: Married    Spouse name: Not on file   Number of children: 3   Years of education: Not on file   Highest education level: Not on file  Occupational History   Occupation: retired  Tobacco Use   Smoking status: Former    Packs/day: 1.00    Years: 8.00    Pack years: 8.00    Types: Cigarettes    Quit date: 06/18/1964    Years since quitting: 57.6   Smokeless tobacco: Never  Vaping Use   Vaping Use: Never used  Substance and Sexual Activity   Alcohol use: No   Drug use: No   Sexual activity: Not Currently    Birth control/protection: Post-menopausal  Other Topics Concern   Not on file  Social History Narrative   Married.  Three children   Social Determinants of Health   Financial Resource Strain: Not on file  Food Insecurity: Not on file  Transportation Needs: Not on file  Physical Activity: Not on file  Stress: Not on file  Social Connections: Not on file  Intimate Partner Violence: Not on file  FAMILY HISTORY: Family History  Problem Relation Age of Onset   Heart attack Father 75   Heart disease Brother 4       CABG   Colon cancer Neg Hx    Esophageal cancer Neg Hx    Inflammatory bowel disease Neg Hx    Liver disease Neg Hx    Pancreatic cancer Neg Hx    Rectal cancer Neg Hx    Stomach cancer Neg Hx    .10 Point review of Systems was done is negative except as noted above.  PHYSICAL EXAMINATION  ECOG PERFORMANCE STATUS: 2 - Symptomatic, <50% confined to bed  Vitals:   01/18/21 0847  BP: 137/64  Pulse: (!) 59  Resp: 18  Temp: 97.7 F (36.5 C)  SpO2: 100%   . GENERAL:alert, in no acute distress and comfortable SKIN: no acute rashes, no significant lesions EYES: conjunctiva are pink and non-injected,  sclera anicteric OROPHARYNX: MMM, no exudates, no oropharyngeal erythema or ulceration NECK: supple, no JVD LYMPH:  no palpable lymphadenopathy in the cervical, axillary or inguinal regions LUNGS: clear to auscultation b/l with normal respiratory effort HEART: regular rate & rhythm ABDOMEN:  normoactive bowel sounds , non tender, not distended. Extremity: no pedal edema PSYCH: alert & oriented x 3 with fluent speech NEURO: no focal motor/sensory deficits   LABORATORY DATA: . CBC Latest Ref Rng & Units 01/18/2021 01/03/2021 12/20/2020  WBC 4.0 - 10.5 K/uL 7.7 8.3 9.0  Hemoglobin 12.0 - 15.0 g/dL 10.5(L) 11.6(L) 10.4(L)  Hematocrit 36.0 - 46.0 % 34.3(L) 38.5 33.8(L)  Platelets 150 - 400 K/uL 309 303 280   . CMP Latest Ref Rng & Units 01/18/2021 01/03/2021 12/20/2020  Glucose 70 - 99 mg/dL 140(H) 136(H) 128(H)  BUN 8 - 23 mg/dL 29(H) 28(H) 34(H)  Creatinine 0.44 - 1.00 mg/dL 2.05(H) 1.88(H) 1.98(H)  Sodium 135 - 145 mmol/L 141 138 138  Potassium 3.5 - 5.1 mmol/L 3.9 4.0 3.8  Chloride 98 - 111 mmol/L 115(H) 113(H) 114(H)  CO2 22 - 32 mmol/L 16(L) 16(L) 15(L)  Calcium 8.9 - 10.3 mg/dL 9.0 8.7(L) 8.9  Total Protein 6.5 - 8.1 g/dL 6.4(L) 7.1 6.4(L)  Total Bilirubin 0.3 - 1.2 mg/dL 0.4 0.4 0.4  Alkaline Phos 38 - 126 U/L 63 71 56  AST 15 - 41 U/L 20 27 19   ALT 0 - 44 U/L 20 28 17     ASSESSMENT and PLAN:   80 y.o. 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 PET/CT 06/19/2017 -- consistent with partial metabolic response to treatment.   Rt adrenal gland bx - showed clear cell RCC   S/p CYtoreductive left radical nephrectomy and left adrenal gland resection on 08/01/2016 by Dr Alinda Money.   10/16/17 PET/CT revealed Continued improvement, with the T7 metastatic lesion no longer significantly hypermetabolic. The previous right lower lobe pulmonary nodule is even  less apparent, perhaps about 2 mm in diameter today; given that this measured 6 mm on 03/28/2017 this probably represents an effectively treated metastatic lesion. Currently no appreciable hypermetabolic activity is identified to suggest active malignancy. Distended gallbladder with gallbladder wall thickening and gallstones. Correlate clinically in assessing for cholecystitis. Small but abnormal amount of free pelvic fluid, nonspecific. Other imaging findings of potential clinical significance: Chronic ethmoid sinusitis. Aortic Atherosclerosis. Stable 5 mm right middle lobe pulmonary nodule, not hypermetabolic but below sensitive PET-CT size thresholds. Left nephrectomy.  Notable pelvic floor laxity with cystocele. Chronic bilateral Sacroiliitis.   04/03/18 PET/CT revealed No evidence for new or progressive hypermetabolic disease on today's study to suggest metastatic progression. 2. Stable appearance of the T7 metastatic lesion without Hypermetabolism. 3. Tiny focus of FDG accumulation identified along the skin of the low right inguinal fold. No associated lesion evident on CT. This may be related to urinary contaminant. 4. Cholelithiasis with similar appearance of diffuse gallbladder wall thickening. 5. Stable 5 mm right middle lobe pulmonary nodule. 6.  Aortic Atherosclerois. 7. Diffuse colonic diverticulosis.    09/18/18 CT A/P revealed "Common duct stent in place. Improvement to resolution in biliary duct dilatation compared to 08/29/2018. Interval cholecystectomy without acute complication. 2. Left nephrectomy, without evidence of metastatic disease. 3.  Tiny hiatal hernia. 4. Coronary artery atherosclerosis. Aortic Atherosclerosis. 5. Mild limitations secondary to lack of IV contrast. 6. Marked pelvic floor laxity with cystocele and rectal prolapse".   07/02/2019 PET/CT ([id number]) which revealed "1. 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   #3 Small pulmonary lesions -- 10/16/17 PET/CT showed pulmonary less apparent than 03/28/17 PET/CT, decreasing from 10mm to 54mm diameter.   MRI brain shows no evidence of metastatic disease   #4 T7/8 Bone metastases - received Xgeva every 4 weeks from May 2018 to June 2019. 10/16/17 PET/CT revealed improvement with T7 metastatic lesion no longer significantly hypermetabolic.  -on Marchelle Folks   #5 ?liver mets- rpt PET/CT from 10/16/2017 shows no overt evidence of metastatic disease in the liver.   #6 Grade 1 Nausea - improved and intermittent. Hasnt used her anti-emetic as instructed so some nausea and decreased po intake.   #7 Grade 1 Diarrhea - resolved   #8 Hyponatremia -  resolved with sodium at 136 - likely related to some element of adrenal insufficiency, diarrhea, limited by mouth intake. Primarily solute free fluid intake.    #9 Acute on chronic renal insufficiency Creatine stable 1.44   #10 Hyperkalemia due to ACEI + RF- resolved   #11 Hemorrhoids -chronic with some bleeding --Recommended Sitz bath and OTC Anusol or Nupercaine for her hemorrhoid relief. -f/u with PCP for continued mx   #12 Hypothyroidism/Adrenal insufficiency/Diabetes -Continue being followed by Dr. Buddy Duty   #14 HTN - ?control. Patient tends to be anxious and has higher blood pressures in the clinic. She can have increased blood pressure from Sutent as well. Plan: -continued close f/u with her PCP /cardiology regarding the many elements necessary for her care that are not directly related to her oncology care. -ACEI held due to AKI and hyperkalemia - following with cardiology to mx this. Has been started on Amlodipine instead.   #15 Grade 1 mucositis- resolved -Advised the patient to continue use of Magic Mouthwash to aid with nutrition   #16 CAD - positive cardiac nuclear stress test Following with cardiology and nephrology    #17 h/o  Choledocholithiasis and cholelithiasis 07/10/18 US Abdomen revealed Biliary duct dilatation with 8 mm calculus at the level of the ampulla in the distal common bile duct. 2. Cholelithiasis. No gallbladder wall thickening or pericholecystic fluid. 3. Appearance of the liver raises concern for underlying hepatic cirrhosis. No focal liver lesions are demonstrable. 4.  Left kidney absent. 5.  Small cysts in right kidney. 6.  Aortic Atherosclerosis.   S/p ERCP on 07/23/18 with Dr. Valarie Merino Mansouraty   09/02/18 Cytopathology from ERCP was suspicious  for malignant cells in bile duct, but was not definitive, other two findings were benign.   # 18 Peripheral arterial disease -- following with cardiology/vascular surgery PLAN:   -Discussed labwork today, 01/18/2021 ; labs stable -Advised pt it would not be unreasonable to take a Vitamin B-Complex daily. -continue Baby ASA back today and confirm this with Cardiologist soon. She sees her Cardiologist next in October. -No clinical or lab findings suggestive of progression of her metastatic renal cell carcinoma. -No prohibitive toxicities from Nivolumab. We will continue current treatment plan with Nivolumab with same premedications.  -Continue Xgeva every 6 weeks. -Will see back in 6 weeks with labs.     FOLLOW UP: Please schedule next 3 cycles of nivolumab with port flush and labs. MD visit in 6 weeks   . The total time spent in the appointment was 20 minutes and more than 50% was on counseling and direct patient cares.  Brunetta Genera MD

## 2021-01-25 ENCOUNTER — Encounter: Payer: Self-pay | Admitting: Hematology

## 2021-01-31 ENCOUNTER — Inpatient Hospital Stay: Payer: Medicare Other

## 2021-01-31 ENCOUNTER — Inpatient Hospital Stay: Payer: Medicare Other | Attending: Hematology

## 2021-01-31 ENCOUNTER — Other Ambulatory Visit: Payer: Self-pay

## 2021-01-31 VITALS — BP 128/62 | HR 62 | Temp 98.2°F | Resp 18 | Wt 130.0 lb

## 2021-01-31 DIAGNOSIS — C642 Malignant neoplasm of left kidney, except renal pelvis: Secondary | ICD-10-CM | POA: Diagnosis not present

## 2021-01-31 DIAGNOSIS — Z7189 Other specified counseling: Secondary | ICD-10-CM

## 2021-01-31 DIAGNOSIS — Z5112 Encounter for antineoplastic immunotherapy: Secondary | ICD-10-CM | POA: Insufficient documentation

## 2021-01-31 DIAGNOSIS — C7951 Secondary malignant neoplasm of bone: Secondary | ICD-10-CM

## 2021-01-31 DIAGNOSIS — Z95828 Presence of other vascular implants and grafts: Secondary | ICD-10-CM

## 2021-01-31 DIAGNOSIS — C649 Malignant neoplasm of unspecified kidney, except renal pelvis: Secondary | ICD-10-CM

## 2021-01-31 LAB — CMP (CANCER CENTER ONLY)
ALT: 18 U/L (ref 0–44)
AST: 19 U/L (ref 15–41)
Albumin: 3.7 g/dL (ref 3.5–5.0)
Alkaline Phosphatase: 49 U/L (ref 38–126)
Anion gap: 7 (ref 5–15)
BUN: 39 mg/dL — ABNORMAL HIGH (ref 8–23)
CO2: 20 mmol/L — ABNORMAL LOW (ref 22–32)
Calcium: 9.8 mg/dL (ref 8.9–10.3)
Chloride: 114 mmol/L — ABNORMAL HIGH (ref 98–111)
Creatinine: 2.11 mg/dL — ABNORMAL HIGH (ref 0.44–1.00)
GFR, Estimated: 24 mL/min — ABNORMAL LOW (ref >60)
Glucose, Bld: 148 mg/dL — ABNORMAL HIGH (ref 70–99)
Potassium: 4.5 mmol/L (ref 3.5–5.1)
Sodium: 141 mmol/L (ref 135–145)
Total Bilirubin: 0.3 mg/dL (ref 0.3–1.2)
Total Protein: 6.8 g/dL (ref 6.5–8.1)

## 2021-01-31 LAB — CBC WITH DIFFERENTIAL (CANCER CENTER ONLY)
Abs Immature Granulocytes: 0.14 10*3/uL — ABNORMAL HIGH (ref 0.00–0.07)
Basophils Absolute: 0.1 10*3/uL (ref 0.0–0.1)
Basophils Relative: 1 %
Eosinophils Absolute: 0.2 10*3/uL (ref 0.0–0.5)
Eosinophils Relative: 2 %
HCT: 35.4 % — ABNORMAL LOW (ref 36.0–46.0)
Hemoglobin: 10.8 g/dL — ABNORMAL LOW (ref 12.0–15.0)
Immature Granulocytes: 2 %
Lymphocytes Relative: 17 %
Lymphs Abs: 1.5 10*3/uL (ref 0.7–4.0)
MCH: 25.2 pg — ABNORMAL LOW (ref 26.0–34.0)
MCHC: 30.5 g/dL (ref 30.0–36.0)
MCV: 82.7 fL (ref 80.0–100.0)
Monocytes Absolute: 1 10*3/uL (ref 0.1–1.0)
Monocytes Relative: 11 %
Neutro Abs: 6 10*3/uL (ref 1.7–7.7)
Neutrophils Relative %: 67 %
Platelet Count: 312 10*3/uL (ref 150–400)
RBC: 4.28 MIL/uL (ref 3.87–5.11)
RDW: 16.1 % — ABNORMAL HIGH (ref 11.5–15.5)
WBC Count: 8.8 10*3/uL (ref 4.0–10.5)
nRBC: 0 % (ref 0.0–0.2)

## 2021-01-31 MED ORDER — SODIUM CHLORIDE 0.9% FLUSH
10.0000 mL | Freq: Once | INTRAVENOUS | Status: AC
  Administered 2021-01-31: 10 mL

## 2021-01-31 MED ORDER — SODIUM CHLORIDE 0.9 % IV SOLN
240.0000 mg | Freq: Once | INTRAVENOUS | Status: AC
  Administered 2021-01-31: 240 mg via INTRAVENOUS
  Filled 2021-01-31: qty 24

## 2021-01-31 MED ORDER — SODIUM CHLORIDE 0.9 % IV SOLN: INTRAVENOUS | Status: AC

## 2021-01-31 NOTE — Patient Instructions (Signed)
[facility]  Discharge Instructions: Thank you for choosing [facility] to provide your oncology and hematology care.   If you have a lab appointment with the [facility], please go directly to the [facility] and check in at the registration area.   Wear comfortable clothing and clothing appropriate for easy access to any Portacath or PICC line.   We strive to give you quality time with your provider. You may need to reschedule your appointment if you arrive late (15 or more minutes).  Arriving late affects you and other patients whose appointments are after yours.  Also, if you miss three or more appointments without notifying the office, you may be dismissed from the clinic at the provider's discretion.      For prescription refill requests, have your pharmacy contact our office and allow 72 hours for refills to be completed.    Today you received the following chemotherapy and/or immunotherapy agents :  Novolumab      To help prevent nausea and vomiting after your treatment, we encourage you to take your nausea medication as directed.  BELOW ARE SYMPTOMS THAT SHOULD BE REPORTED IMMEDIATELY: *FEVER GREATER THAN 100.4 F (38 C) OR HIGHER *CHILLS OR SWEATING *NAUSEA AND VOMITING THAT IS NOT CONTROLLED WITH YOUR NAUSEA MEDICATION *UNUSUAL SHORTNESS OF BREATH *UNUSUAL BRUISING OR BLEEDING *URINARY PROBLEMS (pain or burning when urinating, or frequent urination) *BOWEL PROBLEMS (unusual diarrhea, constipation, pain near the anus) TENDERNESS IN MOUTH AND THROAT WITH OR WITHOUT PRESENCE OF ULCERS (sore throat, sores in mouth, or a toothache) UNUSUAL RASH, SWELLING OR PAIN  UNUSUAL VAGINAL DISCHARGE OR ITCHING   Items with * indicate a potential emergency and should be followed up as soon as possible or go to the Emergency Department if any problems should occur.  Please show the CHEMOTHERAPY ALERT CARD or IMMUNOTHERAPY ALERT CARD at check-in  to the Emergency Department and triage nurse.  Should you have questions after your visit or need to cancel or reschedule your appointment, please contact [facility]  Dept: [phone]  and follow the prompts.  Office hours are 8:00 a.m. to 4:30 p.m. Monday - Friday. Please note that voicemails left after 4:00 p.m. may not be returned until the following business day.  We are closed weekends and major holidays. You have access to a nurse at all times for urgent questions. Please call the main number to the clinic Dept: [phone] and follow the prompts.   For any non-urgent questions, you may also contact your provider using MyChart. We now offer e-Visits for anyone 81 and older to request care online for non-urgent symptoms. For details visit [email or web address].   Also download the MyChart app! Go to the app store, search "MyChart", open the app, select [facility], and log in with your MyChart username and password.  Due to Covid, a mask is required upon entering the hospital/clinic. If you do not have a mask, one will be given to you upon arrival. For doctor visits, patients may have 1 support person aged 60 or older with them. For treatment visits, patients cannot have anyone with them due to current Covid guidelines and our immunocompromised population.

## 2021-02-01 ENCOUNTER — Other Ambulatory Visit: Payer: Medicare Other

## 2021-02-01 ENCOUNTER — Ambulatory Visit: Payer: Medicare Other

## 2021-02-14 ENCOUNTER — Inpatient Hospital Stay: Payer: Medicare Other

## 2021-02-14 ENCOUNTER — Other Ambulatory Visit: Payer: Medicare Other

## 2021-02-14 ENCOUNTER — Ambulatory Visit: Payer: Medicare Other

## 2021-02-14 ENCOUNTER — Other Ambulatory Visit: Payer: Self-pay | Admitting: Hematology

## 2021-02-14 ENCOUNTER — Other Ambulatory Visit: Payer: Self-pay

## 2021-02-14 VITALS — BP 127/62 | HR 62 | Temp 98.2°F | Resp 18 | Wt 130.0 lb

## 2021-02-14 DIAGNOSIS — C642 Malignant neoplasm of left kidney, except renal pelvis: Secondary | ICD-10-CM

## 2021-02-14 DIAGNOSIS — Z5112 Encounter for antineoplastic immunotherapy: Secondary | ICD-10-CM | POA: Diagnosis not present

## 2021-02-14 DIAGNOSIS — C649 Malignant neoplasm of unspecified kidney, except renal pelvis: Secondary | ICD-10-CM

## 2021-02-14 DIAGNOSIS — C7951 Secondary malignant neoplasm of bone: Secondary | ICD-10-CM

## 2021-02-14 DIAGNOSIS — Z95828 Presence of other vascular implants and grafts: Secondary | ICD-10-CM

## 2021-02-14 DIAGNOSIS — Z7189 Other specified counseling: Secondary | ICD-10-CM

## 2021-02-14 LAB — CMP (CANCER CENTER ONLY)
ALT: 28 U/L (ref 0–44)
AST: 26 U/L (ref 15–41)
Albumin: 3.3 g/dL — ABNORMAL LOW (ref 3.5–5.0)
Alkaline Phosphatase: 54 U/L (ref 38–126)
Anion gap: 10 (ref 5–15)
BUN: 28 mg/dL — ABNORMAL HIGH (ref 8–23)
CO2: 14 mmol/L — ABNORMAL LOW (ref 22–32)
Calcium: 8.7 mg/dL — ABNORMAL LOW (ref 8.9–10.3)
Chloride: 115 mmol/L — ABNORMAL HIGH (ref 98–111)
Creatinine: 1.88 mg/dL — ABNORMAL HIGH (ref 0.44–1.00)
GFR, Estimated: 27 mL/min — ABNORMAL LOW (ref >60)
Glucose, Bld: 127 mg/dL — ABNORMAL HIGH (ref 70–99)
Potassium: 4.1 mmol/L (ref 3.5–5.1)
Sodium: 139 mmol/L (ref 135–145)
Total Bilirubin: 0.4 mg/dL (ref 0.3–1.2)
Total Protein: 6.2 g/dL — ABNORMAL LOW (ref 6.5–8.1)

## 2021-02-14 LAB — CBC WITH DIFFERENTIAL (CANCER CENTER ONLY)
Abs Immature Granulocytes: 0.05 10*3/uL (ref 0.00–0.07)
Basophils Absolute: 0.1 10*3/uL (ref 0.0–0.1)
Basophils Relative: 1 %
Eosinophils Absolute: 0.2 10*3/uL (ref 0.0–0.5)
Eosinophils Relative: 2 %
HCT: 32.3 % — ABNORMAL LOW (ref 36.0–46.0)
Hemoglobin: 9.8 g/dL — ABNORMAL LOW (ref 12.0–15.0)
Immature Granulocytes: 1 %
Lymphocytes Relative: 18 %
Lymphs Abs: 1.4 10*3/uL (ref 0.7–4.0)
MCH: 25 pg — ABNORMAL LOW (ref 26.0–34.0)
MCHC: 30.3 g/dL (ref 30.0–36.0)
MCV: 82.4 fL (ref 80.0–100.0)
Monocytes Absolute: 0.9 10*3/uL (ref 0.1–1.0)
Monocytes Relative: 12 %
Neutro Abs: 4.9 10*3/uL (ref 1.7–7.7)
Neutrophils Relative %: 66 %
Platelet Count: 285 10*3/uL (ref 150–400)
RBC: 3.92 MIL/uL (ref 3.87–5.11)
RDW: 16.7 % — ABNORMAL HIGH (ref 11.5–15.5)
WBC Count: 7.5 10*3/uL (ref 4.0–10.5)
nRBC: 0 % (ref 0.0–0.2)

## 2021-02-14 MED ORDER — SODIUM CHLORIDE 0.9% FLUSH
10.0000 mL | Freq: Once | INTRAVENOUS | Status: AC
  Administered 2021-02-14: 10 mL

## 2021-02-14 MED ORDER — SODIUM CHLORIDE 0.9 % IV SOLN: INTRAVENOUS | Status: AC

## 2021-02-14 MED ORDER — SODIUM CHLORIDE 0.9 % IV SOLN
240.0000 mg | Freq: Once | INTRAVENOUS | Status: AC
  Administered 2021-02-14: 240 mg via INTRAVENOUS
  Filled 2021-02-14: qty 24

## 2021-02-14 NOTE — Patient Instructions (Signed)
[facility]  Discharge Instructions: Thank you for choosing [facility] to provide your oncology and hematology care.   If you have a lab appointment with the [facility], please go directly to the [facility] and check in at the registration area.   Wear comfortable clothing and clothing appropriate for easy access to any Portacath or PICC line.   We strive to give you quality time with your provider. You may need to reschedule your appointment if you arrive late (15 or more minutes).  Arriving late affects you and other patients whose appointments are after yours.  Also, if you miss three or more appointments without notifying the office, you may be dismissed from the clinic at the provider's discretion.      For prescription refill requests, have your pharmacy contact our office and allow 72 hours for refills to be completed.    Today you received the following chemotherapy and/or immunotherapy agents Opdivo      To help prevent nausea and vomiting after your treatment, we encourage you to take your nausea medication as directed.  BELOW ARE SYMPTOMS THAT SHOULD BE REPORTED IMMEDIATELY: *FEVER GREATER THAN 100.4 F (38 C) OR HIGHER *CHILLS OR SWEATING *NAUSEA AND VOMITING THAT IS NOT CONTROLLED WITH YOUR NAUSEA MEDICATION *UNUSUAL SHORTNESS OF BREATH *UNUSUAL BRUISING OR BLEEDING *URINARY PROBLEMS (pain or burning when urinating, or frequent urination) *BOWEL PROBLEMS (unusual diarrhea, constipation, pain near the anus) TENDERNESS IN MOUTH AND THROAT WITH OR WITHOUT PRESENCE OF ULCERS (sore throat, sores in mouth, or a toothache) UNUSUAL RASH, SWELLING OR PAIN  UNUSUAL VAGINAL DISCHARGE OR ITCHING   Items with * indicate a potential emergency and should be followed up as soon as possible or go to the Emergency Department if any problems should occur.  Please show the CHEMOTHERAPY ALERT CARD or IMMUNOTHERAPY ALERT CARD at check-in to the  Emergency Department and triage nurse.  Should you have questions after your visit or need to cancel or reschedule your appointment, please contact [facility]  Dept: [phone]  and follow the prompts.  Office hours are 8:00 a.m. to 4:30 p.m. Monday - Friday. Please note that voicemails left after 4:00 p.m. may not be returned until the following business day.  We are closed weekends and major holidays. You have access to a nurse at all times for urgent questions. Please call the main number to the clinic Dept: [phone] and follow the prompts.   For any non-urgent questions, you may also contact your provider using MyChart. We now offer e-Visits for anyone 83 and older to request care online for non-urgent symptoms. For details visit [email or web address].   Also download the MyChart app! Go to the app store, search "MyChart", open the app, select [facility], and log in with your MyChart username and password.  Due to Covid, a mask is required upon entering the hospital/clinic. If you do not have a mask, one will be given to you upon arrival. For doctor visits, patients may have 1 support person aged 21 or older with them. For treatment visits, patients cannot have anyone with them due to current Covid guidelines and our immunocompromised population.   Nivolumab injection What is this medication? NIVOLUMAB (nye VOL ue mab) is a monoclonal antibody. It treats certain types of cancer. Some of the cancers treated are colon cancer, head and neck cancer,Hodgkin lymphoma, lung cancer, and melanoma. This medicine may be used for other purposes; ask your health  care provider orpharmacist if you have questions. COMMON BRAND NAME(S): Opdivo What should I tell my care team before I take this medication? They need to know if you have any of these conditions: autoimmune diseases like Crohn's disease, ulcerative colitis, or lupus have had or planning to have an allogeneic  stem cell transplant (uses someone else's stem cells) history of chest radiation history of organ transplant nervous system problems like myasthenia gravis or Guillain-Barre syndrome an unusual or allergic reaction to nivolumab, other medicines, foods, dyes, or preservatives pregnant or trying to get pregnant breast-feeding How should I use this medication? This medicine is for infusion into a vein. It is given by a health careprofessional in a hospital or clinic setting. A special MedGuide will be given to you before each treatment. Be sure to readthis information carefully each time. Talk to your pediatrician regarding the use of this medicine in children. While this drug may be prescribed for children as young as 12 years for selectedconditions, precautions do apply. Overdosage: If you think you have taken too much of this medicine contact apoison control center or emergency room at once. NOTE: This medicine is only for you. Do not share this medicine with others. What if I miss a dose? It is important not to miss your dose. Call your doctor or health careprofessional if you are unable to keep an appointment. What may interact with this medication? Interactions have not been studied. This list may not describe all possible interactions. Give your health care provider a list of all the medicines, herbs, non-prescription drugs, or dietary supplements you use. Also tell them if you smoke, drink alcohol, or use illegaldrugs. Some items may interact with your medicine. What should I watch for while using this medication? This drug may make you feel generally unwell. Continue your course of treatmenteven though you feel ill unless your doctor tells you to stop. You may need blood work done while you are taking this medicine. Do not become pregnant while taking this medicine or for 5 months after stopping it. Women should inform their doctor if they wish to become pregnant or think they might be  pregnant. There is a potential for serious side effects to an unborn child. Talk to your health care professional or pharmacist for more information. Do not breast-feed an infant while taking this medicine orfor 5 months after stopping it. What side effects may I notice from receiving this medication? Side effects that you should report to your doctor or health care professionalas soon as possible: allergic reactions like skin rash, itching or hives, swelling of the face, lips, or tongue breathing problems blood in the urine bloody or watery diarrhea or black, tarry stools changes in emotions or moods changes in vision chest pain cough dizziness feeling faint or lightheaded, falls fever, chills headache with fever, neck stiffness, confusion, loss of memory, sensitivity to light, hallucination, loss of contact with reality, or seizures joint pain mouth sores redness, blistering, peeling or loosening of the skin, including inside the mouth severe muscle pain or weakness signs and symptoms of high blood sugar such as dizziness; dry mouth; dry skin; fruity breath; nausea; stomach pain; increased hunger or thirst; increased urination signs and symptoms of kidney injury like trouble passing urine or change in the amount of urine signs and symptoms of liver injury like dark yellow or brown urine; general ill feeling or flu-like symptoms; light-colored stools; loss of appetite; nausea; right upper belly pain; unusually weak or tired; yellowing of the  eyes or skin swelling of the ankles, feet, hands trouble passing urine or change in the amount of urine unusually weak or tired weight gain or loss Side effects that usually do not require medical attention (report to yourdoctor or health care professional if they continue or are bothersome): bone pain constipation decreased appetite diarrhea muscle pain nausea, vomiting tiredness This list may not describe all possible side effects. Call your  doctor for medical advice about side effects. You may report side effects to FDA at1-800-FDA-1088. Where should I keep my medication? This drug is given in a hospital or clinic and will not be stored at home. NOTE: This sheet is a summary. It may not cover all possible information. If you have questions about this medicine, talk to your doctor, pharmacist, orhealth care provider.  2022 Elsevier/Gold Standard (2019-08-11 10:08:25)

## 2021-02-15 ENCOUNTER — Ambulatory Visit: Payer: Medicare Other

## 2021-02-15 ENCOUNTER — Other Ambulatory Visit: Payer: Medicare Other

## 2021-02-26 ENCOUNTER — Other Ambulatory Visit: Payer: Self-pay

## 2021-02-26 DIAGNOSIS — C642 Malignant neoplasm of left kidney, except renal pelvis: Secondary | ICD-10-CM

## 2021-02-28 ENCOUNTER — Inpatient Hospital Stay: Payer: Medicare Other

## 2021-02-28 ENCOUNTER — Other Ambulatory Visit: Payer: Self-pay

## 2021-02-28 ENCOUNTER — Inpatient Hospital Stay: Payer: Medicare Other | Attending: Hematology

## 2021-02-28 ENCOUNTER — Inpatient Hospital Stay (HOSPITAL_BASED_OUTPATIENT_CLINIC_OR_DEPARTMENT_OTHER): Payer: Medicare Other | Admitting: Hematology

## 2021-02-28 VITALS — BP 134/69 | HR 65 | Temp 98.1°F | Resp 17 | Ht 59.0 in | Wt 132.2 lb

## 2021-02-28 DIAGNOSIS — D649 Anemia, unspecified: Secondary | ICD-10-CM | POA: Diagnosis not present

## 2021-02-28 DIAGNOSIS — Z86010 Personal history of colonic polyps: Secondary | ICD-10-CM | POA: Insufficient documentation

## 2021-02-28 DIAGNOSIS — C642 Malignant neoplasm of left kidney, except renal pelvis: Secondary | ICD-10-CM

## 2021-02-28 DIAGNOSIS — C649 Malignant neoplasm of unspecified kidney, except renal pelvis: Secondary | ICD-10-CM | POA: Diagnosis not present

## 2021-02-28 DIAGNOSIS — I129 Hypertensive chronic kidney disease with stage 1 through stage 4 chronic kidney disease, or unspecified chronic kidney disease: Secondary | ICD-10-CM | POA: Diagnosis not present

## 2021-02-28 DIAGNOSIS — E785 Hyperlipidemia, unspecified: Secondary | ICD-10-CM | POA: Diagnosis not present

## 2021-02-28 DIAGNOSIS — I2510 Atherosclerotic heart disease of native coronary artery without angina pectoris: Secondary | ICD-10-CM | POA: Insufficient documentation

## 2021-02-28 DIAGNOSIS — Z95828 Presence of other vascular implants and grafts: Secondary | ICD-10-CM

## 2021-02-28 DIAGNOSIS — Z87891 Personal history of nicotine dependence: Secondary | ICD-10-CM | POA: Insufficient documentation

## 2021-02-28 DIAGNOSIS — K649 Unspecified hemorrhoids: Secondary | ICD-10-CM | POA: Insufficient documentation

## 2021-02-28 DIAGNOSIS — C787 Secondary malignant neoplasm of liver and intrahepatic bile duct: Secondary | ICD-10-CM | POA: Diagnosis not present

## 2021-02-28 DIAGNOSIS — C7951 Secondary malignant neoplasm of bone: Secondary | ICD-10-CM | POA: Insufficient documentation

## 2021-02-28 DIAGNOSIS — Z7189 Other specified counseling: Secondary | ICD-10-CM

## 2021-02-28 DIAGNOSIS — K5900 Constipation, unspecified: Secondary | ICD-10-CM | POA: Diagnosis not present

## 2021-02-28 DIAGNOSIS — I739 Peripheral vascular disease, unspecified: Secondary | ICD-10-CM | POA: Diagnosis not present

## 2021-02-28 DIAGNOSIS — E039 Hypothyroidism, unspecified: Secondary | ICD-10-CM | POA: Insufficient documentation

## 2021-02-28 DIAGNOSIS — E119 Type 2 diabetes mellitus without complications: Secondary | ICD-10-CM | POA: Diagnosis not present

## 2021-02-28 DIAGNOSIS — R110 Nausea: Secondary | ICD-10-CM | POA: Insufficient documentation

## 2021-02-28 DIAGNOSIS — N189 Chronic kidney disease, unspecified: Secondary | ICD-10-CM | POA: Diagnosis not present

## 2021-02-28 DIAGNOSIS — E2740 Unspecified adrenocortical insufficiency: Secondary | ICD-10-CM | POA: Diagnosis not present

## 2021-02-28 DIAGNOSIS — R109 Unspecified abdominal pain: Secondary | ICD-10-CM | POA: Diagnosis not present

## 2021-02-28 DIAGNOSIS — I252 Old myocardial infarction: Secondary | ICD-10-CM | POA: Insufficient documentation

## 2021-02-28 DIAGNOSIS — Z5112 Encounter for antineoplastic immunotherapy: Secondary | ICD-10-CM | POA: Insufficient documentation

## 2021-02-28 DIAGNOSIS — K432 Incisional hernia without obstruction or gangrene: Secondary | ICD-10-CM | POA: Insufficient documentation

## 2021-02-28 LAB — CBC WITH DIFFERENTIAL (CANCER CENTER ONLY)
Abs Immature Granulocytes: 0.1 10*3/uL — ABNORMAL HIGH (ref 0.00–0.07)
Basophils Absolute: 0.1 10*3/uL (ref 0.0–0.1)
Basophils Relative: 1 %
Eosinophils Absolute: 0.2 10*3/uL (ref 0.0–0.5)
Eosinophils Relative: 2 %
HCT: 34.6 % — ABNORMAL LOW (ref 36.0–46.0)
Hemoglobin: 10.4 g/dL — ABNORMAL LOW (ref 12.0–15.0)
Immature Granulocytes: 1 %
Lymphocytes Relative: 13 %
Lymphs Abs: 1.2 10*3/uL (ref 0.7–4.0)
MCH: 24.8 pg — ABNORMAL LOW (ref 26.0–34.0)
MCHC: 30.1 g/dL (ref 30.0–36.0)
MCV: 82.6 fL (ref 80.0–100.0)
Monocytes Absolute: 1.1 10*3/uL — ABNORMAL HIGH (ref 0.1–1.0)
Monocytes Relative: 12 %
Neutro Abs: 6.5 10*3/uL (ref 1.7–7.7)
Neutrophils Relative %: 71 %
Platelet Count: 310 10*3/uL (ref 150–400)
RBC: 4.19 MIL/uL (ref 3.87–5.11)
RDW: 16.8 % — ABNORMAL HIGH (ref 11.5–15.5)
WBC Count: 9.1 10*3/uL (ref 4.0–10.5)
nRBC: 0 % (ref 0.0–0.2)

## 2021-02-28 LAB — CMP (CANCER CENTER ONLY)
ALT: 23 U/L (ref 0–44)
AST: 25 U/L (ref 15–41)
Albumin: 3.4 g/dL — ABNORMAL LOW (ref 3.5–5.0)
Alkaline Phosphatase: 58 U/L (ref 38–126)
Anion gap: 8 (ref 5–15)
BUN: 34 mg/dL — ABNORMAL HIGH (ref 8–23)
CO2: 14 mmol/L — ABNORMAL LOW (ref 22–32)
Calcium: 8.5 mg/dL — ABNORMAL LOW (ref 8.9–10.3)
Chloride: 115 mmol/L — ABNORMAL HIGH (ref 98–111)
Creatinine: 1.93 mg/dL — ABNORMAL HIGH (ref 0.44–1.00)
GFR, Estimated: 26 mL/min — ABNORMAL LOW (ref >60)
Glucose, Bld: 141 mg/dL — ABNORMAL HIGH (ref 70–99)
Potassium: 4.3 mmol/L (ref 3.5–5.1)
Sodium: 137 mmol/L (ref 135–145)
Total Bilirubin: 0.4 mg/dL (ref 0.3–1.2)
Total Protein: 6.4 g/dL — ABNORMAL LOW (ref 6.5–8.1)

## 2021-02-28 MED ORDER — SODIUM CHLORIDE 0.9 % IV SOLN
240.0000 mg | Freq: Once | INTRAVENOUS | Status: AC
  Administered 2021-02-28: 240 mg via INTRAVENOUS
  Filled 2021-02-28: qty 24

## 2021-02-28 MED ORDER — HEPARIN SOD (PORK) LOCK FLUSH 100 UNIT/ML IV SOLN
500.0000 [IU] | Freq: Once | INTRAVENOUS | Status: AC | PRN
  Administered 2021-02-28: 500 [IU]

## 2021-02-28 MED ORDER — DENOSUMAB 120 MG/1.7ML ~~LOC~~ SOLN
120.0000 mg | Freq: Once | SUBCUTANEOUS | Status: AC
  Administered 2021-02-28: 120 mg via SUBCUTANEOUS
  Filled 2021-02-28: qty 1.7

## 2021-02-28 MED ORDER — SODIUM CHLORIDE 0.9% FLUSH
10.0000 mL | INTRAVENOUS | Status: DC | PRN
  Administered 2021-02-28: 10 mL

## 2021-02-28 MED ORDER — SODIUM CHLORIDE 0.9% FLUSH
10.0000 mL | Freq: Once | INTRAVENOUS | Status: AC
  Administered 2021-02-28: 10 mL

## 2021-02-28 MED ORDER — SODIUM CHLORIDE 0.9 % IV SOLN: INTRAVENOUS | Status: AC

## 2021-02-28 NOTE — Patient Instructions (Signed)
[facility] MEDICAL [facility]COL[facility]  ? Discharge Instructions: ?Thank you for choosing [facility] to provide your oncology and hematology care.  ? ?If you have a lab appointment with the Cancer Center, please go directly to the Cancer Center and check in at the registration area. ?  ?Wear comfortable clothing and clothing appropriate for easy access to any Portacath or PICC line.  ? ?We strive to give you quality time with your provider. You may need to reschedule your appointment if you arrive late (15 or more minutes).  Arriving late affects you and other patients whose appointments are after yours.  Also, if you miss three or more appointments without notifying the office, you may be dismissed from the clinic at the provider?s discretion.    ?  ?For prescription refill requests, have your pharmacy contact our office and allow 72 hours for refills to be completed.   ? ?Today you received the following chemotherapy and/or immunotherapy agents: nivolumab    ?  ?To help prevent nausea and vomiting after your treatment, we encourage you to take your nausea medication as directed. ? ?BELOW ARE SYMPTOMS THAT SHOULD BE REPORTED IMMEDIATELY: ?*FEVER GREATER THAN 100.4 F (38 ?C) OR HIGHER ?*CHILLS OR SWEATING ?*NAUSEA AND VOMITING THAT IS NOT CONTROLLED WITH YOUR NAUSEA MEDICATION ?*UNUSUAL SHORTNESS OF BREATH ?*UNUSUAL BRUISING OR BLEEDING ?*URINARY PROBLEMS (pain or burning when urinating, or frequent urination) ?*BOWEL PROBLEMS (unusual diarrhea, constipation, pain near the anus) ?TENDERNESS IN MOUTH AND THROAT WITH OR WITHOUT PRESENCE OF ULCERS (sore throat, sores in mouth, or a toothache) ?UNUSUAL RASH, SWELLING OR PAIN  ?UNUSUAL VAGINAL DISCHARGE OR ITCHING  ? ?Items with * indicate a potential emergency and should be followed up as soon as possible or go to the Emergency Department if any problems should occur. ? ?Please show the CHEMOTHERAPY ALERT CARD or IMMUNOTHERAPY ALERT CARD at check-in  to the Emergency Department and triage nurse. ? ?Should you have questions after your visit or need to cancel or reschedule your appointment, please contact [facility]  Dept: [phone]  and follow the prompts.  Office hours are 8:00 a.m. to 4:30 p.m. Monday - Friday. Please note that voicemails left after 4:00 p.m. may not be returned until the following business day.  We are closed weekends and major holidays. You have access to a nurse at all times for urgent questions. Please call the main number to the clinic Dept: [phone] and follow the prompts. ? ? ?For any non-urgent questions, you may also contact your provider using MyChart. We now offer e-Visits for anyone 18 and older to request care online for non-urgent symptoms. For details visit [email or web address]. ?  ?Also download the MyChart app! Go to the app store, search "MyChart", open the app, select [facility], and log in with your MyChart username and password. ? ?Due to Covid, a mask is required upon entering the hospital/clinic. If you do not have a mask, one will be given to you upon arrival. For doctor visits, patients may have 1 support person aged 18 or older with them. For treatment visits, patients cannot have anyone with them due to current Covid guidelines and our immunocompromised population.  ? ?

## 2021-02-28 NOTE — Progress Notes (Addendum)
[facility] Follow up:   DOS .02/28/2021    [name], MD [address], [place] [address]   DIAGNOSIS: metastatic renal cell carcinoma  SUMMARY OF ONCOLOGIC HISTORY: Oncology History  Cancer, 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[place])  01/10/2017 Initial Diagnosis   Cancer, 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[place])   01/02/2018 -  Chemotherapy   Patient is on Treatment Plan : RENAL CELL CARCINOMA NIVOLUMAB Q14D     Renal cell carcinoma ([place])  04/11/2017 Initial Diagnosis   Renal cell carcinoma ([place])   01/02/2018 -  Chemotherapy   The patient had nivolumab (OPDIVO) 240 mg in sodium chloride 0.9 % 100 mL chemo infusion, 240 mg, Intravenous, Once, 47 of 50 cycles Administration: 240 mg (01/02/2018), 240 mg (01/15/2018), 240 mg (01/30/2018), 240 mg (02/13/2018), 240 mg (02/27/2018), 240 mg (03/13/2018), 240 mg (03/27/2018), 240 mg (04/10/2018), 240 mg (04/24/2018), 240 mg (05/08/2018), 240 mg (05/22/2018), 240 mg (06/05/2018), 240 mg (06/19/2018), 240 mg (08/28/2018), 240 mg (09/11/2018), 240 mg (10/09/2018), 240 mg (10/22/2018), 240 mg (11/06/2018), 240 mg (11/20/2018), 240 mg (12/04/2018), 240 mg (12/18/2018), 240 mg (01/01/2019), 240 mg (01/15/2019), 240 mg (01/29/2019), 240 mg (02/12/2019), 240 mg (02/26/2019), 240 mg (03/12/2019), 240 mg (03/26/2019), 240 mg (04/09/2019), 240 mg (04/28/2019), 240 mg (05/12/2019), 240 mg (05/27/2019), 240 mg (06/09/2019), 240 mg (06/23/2019), 240 mg (07/07/2019), 240 mg (07/21/2019), 240 mg (08/04/2019), 240 mg (08/18/2019), 240 mg (09/03/2019), 240 mg (09/15/2019), 240 mg (09/29/2019), 240 mg (10/12/2019), 240 mg (10/27/2019), 240 mg (11/10/2019), 240 mg (12/08/2019), 240 mg (12/22/2019), 240 mg (01/19/2020)   for chemotherapy treatment.       CURRENT THERAPY: metastatic renal cell carcinoma, Delton See and Nivolumab  INTERVAL HISTORY:  [name] 80 y.o. female returns for continued management of metastatic renal cell carcinoma.  Her last clinic visit With [name] was on 01/18/2021. Patient notes no  acute new symptoms.  No fevers no chills no night sweats no new palpable lumps or bumps.  No new chest pain or shortness of breath.  No abdominal pain or distention. No change in bowel habits or urinary habits.  No new skin rashes. Being bothered by an ulcer and color change of her left foot little toe probably due to her peripheral arterial disease.  This is being managed by her podiatrist and she is wearing a special shoe on the left foot. We discussed getting an updated PET CT scan since she had her last one in May 2022.  Denies any new toxicities from nivolumab.  Patient Active Problem List   Diagnosis Date Noted   Incisional hernia 06/23/2020   Gastritis and gastroduodenitis 02/26/2020   Ventral hernia without obstruction or gangrene 02/26/2020   Rectal discomfort 02/26/2020   History of colonic polyps 02/26/2020   History of cholecystectomy 07/17/2019   Anemia 10/10/2018   Chronic cholecystitis 09/04/2018   Acute cholecystitis 08/29/2018   Constipation 08/23/2018   Abdominal pain 08/23/2018   Biliary stricture 08/23/2018   Preop cardiovascular exam 08/14/2018   Coronary artery disease involving native coronary artery of native heart without angina pectoris 08/14/2018   Dyslipidemia 08/14/2018   Abnormal LFTs 08/05/2018   Choledocholithiasis 08/05/2018   Dilated bile duct 08/05/2018   History of ERCP 08/05/2018   History of renal cell carcinoma 08/05/2018   Port-A-Cath in place 01/30/2018   Counseling regarding advance care planning and goals of care 01/04/2018   Renal cell carcinoma ([place]) 04/11/2017   Cancer, 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[place]) 01/10/2017   Left renal mass 08/01/2016  is allergic to doxycycline, adhesive [tape], nsaids, and statins.  MEDICAL HISTORY: Past Medical History:  Diagnosis Date   Anemia    Arthritis    lower back, hips, hands   Biliary stricture    Diabetes mellitus ([place])    type 2    Early cataracts, bilateral    Md just watching   Elevated  liver enzymes    Family history of adverse reaction to anesthesia    Daughter hard to wake up   Gallstones    GERD (gastroesophageal reflux disease)    occasional - diet controlled   History of blood transfusion 2018   History of hiatal hernia    HTN (hypertension)    Hyperlipidemia    Hypothyroidism    left renal ca dx'd 2018   renal cancer - left kidney removed, pill chemo x 1 yr   Myocardial infarction ([place]) 1991   no deficits   SVD (spontaneous vaginal delivery)    x 3   Wears glasses     SURGICAL HISTORY: Past Surgical History:  Procedure Laterality Date   BALLOON DILATION N/A 07/23/2018   Procedure: BALLOON DILATION;  Surgeon: Mansouraty, Telford Nab., MD;  Location: [facility];  Service: Gastroenterology;  Laterality: N/A;   BILIARY BRUSHING  08/10/2018   Procedure: BILIARY BRUSHING;  Surgeon: Rush Landmark Telford Nab., MD;  Location: [facility];  Service: Gastroenterology;;   BILIARY BRUSHING  11/30/2018   Procedure: BILIARY BRUSHING;  Surgeon: Irving Copas., MD;  Location: [facility];  Service: Gastroenterology;;   BILIARY DILATION  08/10/2018   Procedure: BILIARY DILATION;  Surgeon: Irving Copas., MD;  Location: [facility];  Service: Gastroenterology;;   BILIARY DILATION  08/27/2018   Procedure: BILIARY DILATION;  Surgeon: Irving Copas., MD;  Location: [facility];  Service: Gastroenterology;;   BILIARY DILATION  01/24/2020   Procedure: BILIARY DILATION;  Surgeon: Irving Copas., MD;  Location: [place];  Service: Gastroenterology;;   BILIARY STENT PLACEMENT  08/10/2018   Procedure: BILIARY STENT PLACEMENT;  Surgeon: Irving Copas., MD;  Location: [facility];  Service: Gastroenterology;;   BILIARY STENT PLACEMENT  08/27/2018   Procedure: BILIARY STENT PLACEMENT;  Surgeon: Irving Copas., MD;  Location: [facility];  Service: Gastroenterology;;   BILIARY STENT PLACEMENT  11/30/2018   Procedure: BILIARY STENT  PLACEMENT;  Surgeon: Irving Copas., MD;  Location: [place];  Service: Gastroenterology;;   BIOPSY  07/23/2018   Procedure: BIOPSY;  Surgeon: Irving Copas., MD;  Location: [place];  Service: Gastroenterology;;   BIOPSY  01/24/2020   Procedure: BIOPSY;  Surgeon: Irving Copas., MD;  Location: [facility];  Service: Gastroenterology;;   CHOLECYSTECTOMY N/A 09/02/2018   Procedure: LAPAROSCOPIC CHOLECYSTECTOMY;  Surgeon: Stark Klein, MD;  Location: [facility];  Service: General;  Laterality: N/A;   COLONOSCOPY     normal    ENDOSCOPIC RETROGRADE CHOLANGIOPANCREATOGRAPHY (ERCP) WITH PROPOFOL N/A 08/10/2018   Procedure: ENDOSCOPIC RETROGRADE CHOLANGIOPANCREATOGRAPHY (ERCP) WITH PROPOFOL;  Surgeon: Irving Copas., MD;  Location: [place];  Service: Gastroenterology;  Laterality: N/A;   ENDOSCOPIC RETROGRADE CHOLANGIOPANCREATOGRAPHY (ERCP) WITH PROPOFOL N/A 11/30/2018   Procedure: ENDOSCOPIC RETROGRADE CHOLANGIOPANCREATOGRAPHY (ERCP) WITH PROPOFOL;  Surgeon: Rush Landmark Telford Nab., MD;  Location: [place];  Service: Gastroenterology;  Laterality: N/A;   ENDOSCOPIC RETROGRADE CHOLANGIOPANCREATOGRAPHY (ERCP) WITH PROPOFOL N/A 01/24/2020   Procedure: ENDOSCOPIC RETROGRADE CHOLANGIOPANCREATOGRAPHY (ERCP) WITH PROPOFOL;  Surgeon: Rush Landmark Telford Nab., MD;  Location: [facility];  Service: Gastroenterology;  Laterality: N/A;   ERCP N/A 07/23/2018  Procedure: ENDOSCOPIC RETROGRADE CHOLANGIOPANCREATOGRAPHY (ERCP);  Surgeon: Irving Copas., MD;  Location: [facility];  Service: Gastroenterology;  Laterality: N/A;   ERCP N/A 08/27/2018   Procedure: ENDOSCOPIC RETROGRADE CHOLANGIOPANCREATOGRAPHY (ERCP);  Surgeon: Irving Copas., MD;  Location: [facility];  Service: Gastroenterology;  Laterality: N/A;   ESOPHAGOGASTRODUODENOSCOPY N/A 01/24/2020   Procedure: ESOPHAGOGASTRODUODENOSCOPY (EGD);  Surgeon: Irving Copas., MD;  Location: [facility];  Service: Gastroenterology;  Laterality: N/A;   ESOPHAGOGASTRODUODENOSCOPY (EGD) WITH PROPOFOL N/A 08/27/2018   Procedure: ESOPHAGOGASTRODUODENOSCOPY (EGD) WITH PROPOFOL;  Surgeon: Rush Landmark Telford Nab., MD;  Location: [facility];  Service: Gastroenterology;  Laterality: N/A;   EUS N/A 08/27/2018   Procedure: ESOPHAGEAL ENDOSCOPIC ULTRASOUND (EUS) RADIAL;  Surgeon: Rush Landmark Telford Nab., MD;  Location: [facility];  Service: Gastroenterology;  Laterality: N/A;   FINE NEEDLE ASPIRATION  08/27/2018   Procedure: FINE NEEDLE ASPIRATION (FNA) LINEAR;  Surgeon: Irving Copas., MD;  Location: [place];  Service: Gastroenterology;;   Fatima Blank HERNIA REPAIR N/A 06/23/2020   Procedure: [place];  Surgeon: Felicie Morn, MD;  Location: [facility];  Service: General;  Laterality: N/A;  ROOM 2 STARTING AT 11:00AM FOR 90 MIN   IR IMAGING GUIDED PORT INSERTION  01/08/2018   LAPAROSCOPIC NEPHRECTOMY Left 08/01/2016   Procedure: LAPAROSCOPIC  RADICAL NEPHRECTOMY/ REPAIR OF UMBILICAL HERNIA;  Surgeon: Raynelle Bring, MD;  Location: [facility];  Service: Urology;  Laterality: Left;   REMOVAL OF STONES  07/23/2018   Procedure: REMOVAL OF GALL STONES;  Surgeon: Rush Landmark Telford Nab., MD;  Location: [facility];  Service: Gastroenterology;;   REMOVAL OF STONES  08/10/2018   Procedure: REMOVAL OF STONES;  Surgeon: Irving Copas., MD;  Location: [facility];  Service: Gastroenterology;;   REMOVAL OF STONES  08/27/2018   Procedure: REMOVAL OF STONES;  Surgeon: Irving Copas., MD;  Location: [facility];  Service: Gastroenterology;;   REMOVAL OF STONES  01/24/2020   Procedure: REMOVAL OF STONES;  Surgeon: Irving Copas., MD;  Location: [facility];  Service: Gastroenterology;;   [name]  07/23/2018   Procedure: [name];  Surgeon: Irving Copas., MD;  Location: [facility];  Service: Gastroenterology;;   Lavell Islam REMOVAL   08/27/2018   Procedure: STENT REMOVAL;  Surgeon: Irving Copas., MD;  Location: [facility];  Service: Gastroenterology;;   Lavell Islam REMOVAL  11/30/2018   Procedure: STENT REMOVAL;  Surgeon: Irving Copas., MD;  Location: [facility];  Service: Gastroenterology;;   STENT REMOVAL  01/24/2020   Procedure: STENT REMOVAL;  Surgeon: Irving Copas., MD;  Location: [facility];  Service: Gastroenterology;;   UPPER GI ENDOSCOPY     x 1   VAGINAL PROLAPSE REPAIR  11/18/2019   [facility]    SOCIAL HISTORY: Social History   Socioeconomic History   Marital status: Married    Spouse name: Not on file   Number of children: 3   Years of education: Not on file   Highest education level: Not on file  Occupational History   Occupation: retired  Tobacco Use   Smoking status: Former    Packs/day: 1.00    Years: 8.00    Pack years: 8.00    Types: Cigarettes    Quit date: 06/18/1964    Years since quitting: 56.7   Smokeless tobacco: Never  Vaping Use   Vaping Use: Never used  Substance and Sexual Activity   Alcohol use: No   Drug use: No   Sexual activity: Not Currently  Birth control/protection: Post-menopausal  Other Topics Concern   Not on file  Social History Narrative   Married.  Three children   Social Determinants of Health   Financial Resource Strain: Not on file  Food Insecurity: Not on file  Transportation Needs: Not on file  Physical Activity: Not on file  Stress: Not on file  Social Connections: Not on file  Intimate Partner Violence: Not on file    FAMILY HISTORY: Family History  Problem Relation Age of Onset   Heart attack Father 95   Heart disease Brother 50       CABG   Colon cancer Neg Hx    Esophageal cancer Neg Hx    Inflammatory bowel disease Neg Hx    Liver disease Neg Hx    Pancreatic cancer Neg Hx    Rectal cancer Neg Hx    Stomach cancer Neg Hx    .10 Point review of Systems was done is negative except as noted  above.    PHYSICAL EXAMINATION  ECOG PERFORMANCE STATUS: 2 - Symptomatic, <50% confined to bed  Vitals:   02/28/21 0910  BP: 134/69  Pulse: 65  Resp: 17  Temp: 98.1 F (36.7 C)  SpO2: 100%   . GENERAL:alert, in no acute distress and comfortable SKIN: no acute rashes, no significant lesions EYES: conjunctiva are pink and non-injected, sclera anicteric OROPHARYNX: MMM, no exudates, no oropharyngeal erythema or ulceration NECK: supple, no JVD LYMPH:  no palpable lymphadenopathy in the cervical, axillary or inguinal regions LUNGS: clear to auscultation b/l with normal respiratory effort HEART: regular rate & rhythm ABDOMEN:  normoactive bowel sounds , non tender, not distended. Extremity: no pedal edema PSYCH: alert & oriented x 3 with fluent speech NEURO: no focal motor/sensory deficits    LABORATORY DATA: . CBC Latest Ref Rng & Units 02/28/2021 02/14/2021 01/31/2021  WBC 4.0 - 10.5 K/uL 9.1 7.5 8.8  Hemoglobin 12.0 - 15.0 g/dL 10.4(L) 9.8(L) 10.8(L)  Hematocrit 36.0 - 46.0 % 34.6(L) 32.3(L) 35.4(L)  Platelets 150 - 400 K/uL 310 285 312   . CMP Latest Ref Rng & Units 02/28/2021 02/14/2021 01/31/2021  Glucose 70 - 99 mg/dL 141(H) 127(H) 148(H)  BUN 8 - 23 mg/dL 34(H) 28(H) 39(H)  Creatinine 0.44 - 1.00 mg/dL 1.93(H) 1.88(H) 2.11(H)  Sodium 135 - 145 mmol/L 137 139 141  Potassium 3.5 - 5.1 mmol/L 4.3 4.1 4.5  Chloride 98 - 111 mmol/L 115(H) 115(H) 114(H)  CO2 22 - 32 mmol/L 14(L) 14(L) 20(L)  Calcium 8.9 - 10.3 mg/dL 8.5(L) 8.7(L) 9.8  Total Protein 6.5 - 8.1 g/dL 6.4(L) 6.2(L) 6.8  Total Bilirubin 0.3 - 1.2 mg/dL 0.4 0.4 0.3  Alkaline Phos 38 - 126 U/L 58 54 49  AST 15 - 41 U/L 25 26 19   ALT 0 - 44 U/L 23 28 18     ASSESSMENT and PLAN:   80 y.o. 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 PET/CT 06/19/2017 -- consistent with  partial metabolic response to treatment.   Rt adrenal gland bx - showed clear cell RCC   S/p CYtoreductive left radical nephrectomy and left adrenal gland resection on 08/01/2016 by Dr Alinda Money.   10/16/17 PET/CT revealed Continued improvement, with the T7 metastatic lesion no longer significantly hypermetabolic. The previous right lower lobe pulmonary nodule is even less apparent, perhaps about 2 mm in diameter today; given that this  measured 6 mm on 03/28/2017 this probably represents an effectively treated metastatic lesion. Currently no appreciable hypermetabolic activity is identified to suggest active malignancy. Distended gallbladder with gallbladder wall thickening and gallstones. Correlate clinically in assessing for cholecystitis. Small but abnormal amount of free pelvic fluid, nonspecific. Other imaging findings of potential clinical significance: Chronic ethmoid sinusitis. Aortic Atherosclerosis. Stable 5 mm right middle lobe pulmonary nodule, not hypermetabolic but below sensitive PET-CT size thresholds. Left nephrectomy. Notable pelvic floor laxity with cystocele. Chronic bilateral Sacroiliitis.   04/03/18 PET/CT revealed No evidence for new or progressive hypermetabolic disease on today's study to suggest metastatic progression. 2. Stable appearance of the T7 metastatic lesion without Hypermetabolism. 3. Tiny focus of FDG accumulation identified along the skin of the low right inguinal fold. No associated lesion evident on CT. This may be related to urinary contaminant. 4. Cholelithiasis with similar appearance of diffuse gallbladder wall thickening. 5. Stable 5 mm right middle lobe pulmonary nodule. 6.  Aortic Atherosclerois. 7. Diffuse colonic diverticulosis.    09/18/18 CT A/P revealed "Common duct stent in place. Improvement to resolution in biliary duct dilatation compared to 08/29/2018. Interval cholecystectomy without acute complication. 2. Left nephrectomy, without evidence of metastatic  disease. 3.  Tiny hiatal hernia. 4. Coronary artery atherosclerosis. Aortic Atherosclerosis. 5. Mild limitations secondary to lack of IV contrast. 6. Marked pelvic floor laxity with cystocele and rectal prolapse".   07/02/2019 PET/CT ([id number]) which revealed "1. 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   #3 Small pulmonary lesions -- 10/16/17 PET/CT showed pulmonary less apparent than 03/28/17 PET/CT, decreasing from 43mm to 20mm diameter.   MRI brain shows no evidence of metastatic disease   #4 T7/8 Bone metastases - received Xgeva every 4 weeks from May 2018 to June 2019. 10/16/17 PET/CT revealed improvement with T7 metastatic lesion no longer significantly hypermetabolic.  -on Marchelle Folks   #5 ?liver mets- rpt PET/CT from 10/16/2017 shows no overt evidence of metastatic disease in the liver.   #6 Grade 1 Nausea - improved and intermittent. Hasnt used her anti-emetic as instructed so some nausea and decreased po intake.   #7 Grade 1 Diarrhea - resolved   #8 Hyponatremia -  resolved with sodium at 136 - likely related to some element of adrenal insufficiency, diarrhea, limited by mouth intake. Primarily solute free fluid intake.    #9 Acute on chronic renal insufficiency Creatine stable 1.44   #10 Hyperkalemia due to ACEI + RF- resolved   #11 Hemorrhoids -chronic with some bleeding --Recommended Sitz bath and OTC Anusol or Nupercaine for her hemorrhoid relief. -f/u with PCP for continued mx   #12 Hypothyroidism/Adrenal insufficiency/Diabetes -Continue being followed by Dr. Buddy Duty   #14 HTN - ?control. Patient tends to be anxious and has higher blood pressures in the clinic. She can have increased blood pressure from Sutent as well. Plan: -continued close f/u with her PCP /cardiology regarding the many elements necessary for her care that are not directly related to her  oncology care. -ACEI held due to AKI and hyperkalemia - following with cardiology to mx this. Has been started on Amlodipine instead.   #15 Grade 1 mucositis- resolved -Advised the patient to continue use of Magic Mouthwash to aid with nutrition   #16 CAD - positive cardiac nuclear stress test Following with cardiology and nephrology    #17 h/o Choledocholithiasis and cholelithiasis 07/10/18 US Abdomen revealed Biliary duct  dilatation with 8 mm calculus at the level of the ampulla in the distal common bile duct. 2. Cholelithiasis. No gallbladder wall thickening or pericholecystic fluid. 3. Appearance of the liver raises concern for underlying hepatic cirrhosis. No focal liver lesions are demonstrable. 4.  Left kidney absent. 5.  Small cysts in right kidney. 6.  Aortic Atherosclerosis.   S/p ERCP on 07/23/18 with Dr. Valarie Merino Mansouraty   09/02/18 Cytopathology from ERCP was suspicious for malignant cells in bile duct, but was not definitive, other two findings were benign.   # 18 Peripheral arterial disease -- following with cardiology/vascular surgery PLAN:   -Discussed labwork today, 02/28/2021; labs stable -continue Baby ASA back today and confirm this with Cardiologist soon. She sees her Cardiologist next in October. -No clinical or lab findings suggestive of progression of her metastatic renal cell carcinoma. -No prohibitive toxicities from Nivolumab. We will continue current treatment plan with Nivolumab with same premedications.  -Continue Xgeva every 6 weeks. PET CT scan in 4 to 5 weeks to reevaluate response to treatment of her metastatic renal cell carcinoma to her immunotherapy.  Cannot do CT scan without contrast since the patient has chronic kidney disease and only has a single kidney. -Will see back in 6 weeks with labs.     FOLLOW UP: Please schedule next 6 cycles of nivolumab with port flush and labs. PET/CT and 5 weeks MD visit in 6 weeks  . The total time spent in the  appointment was 30 minutes and more than 50% was on counseling and direct patient cares, coordination of care, ordering toxicity assessment and management of antineoplastic immunotherapy.   Brunetta Genera MD

## 2021-03-01 ENCOUNTER — Ambulatory Visit: Payer: Medicare Other

## 2021-03-01 ENCOUNTER — Other Ambulatory Visit: Payer: Medicare Other

## 2021-03-01 ENCOUNTER — Ambulatory Visit: Payer: Medicare Other | Admitting: Hematology

## 2021-03-02 ENCOUNTER — Telehealth: Payer: Self-pay | Admitting: Hematology

## 2021-03-02 NOTE — Telephone Encounter (Signed)
Scheduled follow-up appointments per 1/19 los. Patient is aware. °

## 2021-03-14 ENCOUNTER — Other Ambulatory Visit: Payer: Self-pay | Admitting: Hematology

## 2021-03-14 ENCOUNTER — Inpatient Hospital Stay: Payer: Medicare Other

## 2021-03-14 ENCOUNTER — Other Ambulatory Visit: Payer: Self-pay

## 2021-03-14 VITALS — BP 164/68 | HR 75 | Temp 97.7°F | Resp 17 | Wt 130.8 lb

## 2021-03-14 DIAGNOSIS — Z7189 Other specified counseling: Secondary | ICD-10-CM

## 2021-03-14 DIAGNOSIS — C7951 Secondary malignant neoplasm of bone: Secondary | ICD-10-CM

## 2021-03-14 DIAGNOSIS — C642 Malignant neoplasm of left kidney, except renal pelvis: Secondary | ICD-10-CM

## 2021-03-14 DIAGNOSIS — Z5112 Encounter for antineoplastic immunotherapy: Secondary | ICD-10-CM | POA: Diagnosis not present

## 2021-03-14 DIAGNOSIS — Z95828 Presence of other vascular implants and grafts: Secondary | ICD-10-CM

## 2021-03-14 LAB — CMP (CANCER CENTER ONLY)
ALT: 28 U/L (ref 0–44)
AST: 21 U/L (ref 15–41)
Albumin: 3.6 g/dL (ref 3.5–5.0)
Alkaline Phosphatase: 60 U/L (ref 38–126)
Anion gap: 7 (ref 5–15)
BUN: 34 mg/dL — ABNORMAL HIGH (ref 8–23)
CO2: 15 mmol/L — ABNORMAL LOW (ref 22–32)
Calcium: 9 mg/dL (ref 8.9–10.3)
Chloride: 115 mmol/L — ABNORMAL HIGH (ref 98–111)
Creatinine: 1.87 mg/dL — ABNORMAL HIGH (ref 0.44–1.00)
GFR, Estimated: 27 mL/min — ABNORMAL LOW (ref >60)
Glucose, Bld: 139 mg/dL — ABNORMAL HIGH (ref 70–99)
Potassium: 4.5 mmol/L (ref 3.5–5.1)
Sodium: 137 mmol/L (ref 135–145)
Total Bilirubin: 0.4 mg/dL (ref 0.3–1.2)
Total Protein: 6.7 g/dL (ref 6.5–8.1)

## 2021-03-14 LAB — CBC WITH DIFFERENTIAL/PLATELET
Abs Immature Granulocytes: 0.07 10*3/uL (ref 0.00–0.07)
Basophils Absolute: 0.1 10*3/uL (ref 0.0–0.1)
Basophils Relative: 1 %
Eosinophils Absolute: 0.1 10*3/uL (ref 0.0–0.5)
Eosinophils Relative: 1 %
HCT: 35 % — ABNORMAL LOW (ref 36.0–46.0)
Hemoglobin: 10.7 g/dL — ABNORMAL LOW (ref 12.0–15.0)
Immature Granulocytes: 1 %
Lymphocytes Relative: 15 %
Lymphs Abs: 1.4 10*3/uL (ref 0.7–4.0)
MCH: 25.1 pg — ABNORMAL LOW (ref 26.0–34.0)
MCHC: 30.6 g/dL (ref 30.0–36.0)
MCV: 82 fL (ref 80.0–100.0)
Monocytes Absolute: 0.7 10*3/uL (ref 0.1–1.0)
Monocytes Relative: 8 %
Neutro Abs: 6.9 10*3/uL (ref 1.7–7.7)
Neutrophils Relative %: 74 %
Platelets: 315 10*3/uL (ref 150–400)
RBC: 4.27 MIL/uL (ref 3.87–5.11)
RDW: 17.3 % — ABNORMAL HIGH (ref 11.5–15.5)
WBC: 9.2 10*3/uL (ref 4.0–10.5)
nRBC: 0 % (ref 0.0–0.2)

## 2021-03-14 MED ORDER — SODIUM CHLORIDE 0.9% FLUSH
10.0000 mL | Freq: Once | INTRAVENOUS | Status: AC
  Administered 2021-03-14: 10 mL

## 2021-03-14 MED ORDER — SODIUM CHLORIDE 0.9 % IV SOLN: INTRAVENOUS | Status: AC

## 2021-03-14 MED ORDER — SODIUM CHLORIDE 0.9% FLUSH
10.0000 mL | INTRAVENOUS | Status: DC | PRN
  Administered 2021-03-14: 10 mL

## 2021-03-14 MED ORDER — SODIUM CHLORIDE 0.9 % IV SOLN
240.0000 mg | Freq: Once | INTRAVENOUS | Status: AC
  Administered 2021-03-14: 240 mg via INTRAVENOUS
  Filled 2021-03-14: qty 24

## 2021-03-14 MED ORDER — HEPARIN SOD (PORK) LOCK FLUSH 100 UNIT/ML IV SOLN
500.0000 [IU] | Freq: Once | INTRAVENOUS | Status: AC | PRN
  Administered 2021-03-14: 500 [IU]

## 2021-03-14 NOTE — Patient Instructions (Signed)
[name]  Discharge Instructions: ?Thank you for choosing [name] to provide your oncology and hematology care.  ? ?If you have a lab appointment with the [name], please go directly to the [name] and check in at the registration area. ?  ?Wear comfortable clothing and clothing appropriate for easy access to any Portacath or PICC line.  ? ?We strive to give you quality time with your provider. You may need to reschedule your appointment if you arrive late (15 or more minutes).  Arriving late affects you and other patients whose appointments are after yours.  Also, if you miss three or more appointments without notifying the office, you may be dismissed from the clinic at the provider?s discretion.    ?  ?For prescription refill requests, have your pharmacy contact our office and allow 72 hours for refills to be completed.   ? ?Today you received the following chemotherapy and/or immunotherapy agents Opdivo    ?  ?To help prevent nausea and vomiting after your treatment, we encourage you to take your nausea medication as directed. ? ?BELOW ARE SYMPTOMS THAT SHOULD BE REPORTED IMMEDIATELY: ?*FEVER GREATER THAN 100.4 F (38 ?C) OR HIGHER ?*CHILLS OR SWEATING ?*NAUSEA AND VOMITING THAT IS NOT CONTROLLED WITH YOUR NAUSEA MEDICATION ?*UNUSUAL SHORTNESS OF BREATH ?*UNUSUAL BRUISING OR BLEEDING ?*URINARY PROBLEMS (pain or burning when urinating, or frequent urination) ?*BOWEL PROBLEMS (unusual diarrhea, constipation, pain near the anus) ?TENDERNESS IN MOUTH AND THROAT WITH OR WITHOUT PRESENCE OF ULCERS (sore throat, sores in mouth, or a toothache) ?UNUSUAL RASH, SWELLING OR PAIN  ?UNUSUAL VAGINAL DISCHARGE OR ITCHING  ? ?Items with * indicate a potential emergency and should be followed up as soon as possible or go to the Emergency Department if any problems should occur. ? ?Please show the CHEMOTHERAPY ALERT CARD or IMMUNOTHERAPY ALERT CARD at check-in to the  Emergency Department and triage nurse. ? ?Should you have questions after your visit or need to cancel or reschedule your appointment, please contact [name]  Dept: [phone]  and follow the prompts.  Office hours are 8:00 a.m. to 4:30 p.m. Monday - Friday. Please note that voicemails left after 4:00 p.m. may not be returned until the following business day.  We are closed weekends and major holidays. You have access to a nurse at all times for urgent questions. Please call the main number to the clinic Dept: [phone] and follow the prompts. ? ? ?For any non-urgent questions, you may also contact your provider using MyChart. We now offer e-Visits for anyone 18 and older to request care online for non-urgent symptoms. For details visit [email or web address]. ?  ?Also download the MyChart app! Go to the app store, search "MyChart", open the app, select [facility], and log in with your MyChart username and password. ? ?Due to Covid, a mask is required upon entering the hospital/clinic. If you do not have a mask, one will be given to you upon arrival. For doctor visits, patients may have 1 support person aged 18 or older with them. For treatment visits, patients cannot have anyone with them due to current Covid guidelines and our immunocompromised population.  ? ?

## 2021-03-14 NOTE — Progress Notes (Signed)
Pt refused to stay for entire infusion of IVF. Pt states she will stay until 16:00, but no longer as she is worried about traffic and getting home before dark. IVF were started at 1429. RN made MD and clinic RN aware.

## 2021-03-16 ENCOUNTER — Other Ambulatory Visit: Payer: Self-pay | Admitting: Gastroenterology

## 2021-03-28 ENCOUNTER — Inpatient Hospital Stay: Payer: Medicare Other | Attending: Hematology

## 2021-03-28 ENCOUNTER — Other Ambulatory Visit: Payer: Self-pay

## 2021-03-28 ENCOUNTER — Inpatient Hospital Stay: Payer: Medicare Other

## 2021-03-28 VITALS — BP 137/68 | HR 74 | Temp 97.8°F | Resp 18 | Wt 127.2 lb

## 2021-03-28 DIAGNOSIS — Z5112 Encounter for antineoplastic immunotherapy: Secondary | ICD-10-CM | POA: Diagnosis not present

## 2021-03-28 DIAGNOSIS — C7951 Secondary malignant neoplasm of bone: Secondary | ICD-10-CM

## 2021-03-28 DIAGNOSIS — C642 Malignant neoplasm of left kidney, except renal pelvis: Secondary | ICD-10-CM | POA: Insufficient documentation

## 2021-03-28 DIAGNOSIS — Z452 Encounter for adjustment and management of vascular access device: Secondary | ICD-10-CM | POA: Diagnosis not present

## 2021-03-28 DIAGNOSIS — Z7189 Other specified counseling: Secondary | ICD-10-CM

## 2021-03-28 LAB — CMP (CANCER CENTER ONLY)
ALT: 18 U/L (ref 0–44)
AST: 18 U/L (ref 15–41)
Albumin: 3.5 g/dL (ref 3.5–5.0)
Alkaline Phosphatase: 58 U/L (ref 38–126)
Anion gap: 9 (ref 5–15)
BUN: 24 mg/dL — ABNORMAL HIGH (ref 8–23)
CO2: 13 mmol/L — ABNORMAL LOW (ref 22–32)
Calcium: 7.8 mg/dL — ABNORMAL LOW (ref 8.9–10.3)
Chloride: 117 mmol/L — ABNORMAL HIGH (ref 98–111)
Creatinine: 1.8 mg/dL — ABNORMAL HIGH (ref 0.44–1.00)
GFR, Estimated: 28 mL/min — ABNORMAL LOW (ref >60)
Glucose, Bld: 152 mg/dL — ABNORMAL HIGH (ref 70–99)
Potassium: 4 mmol/L (ref 3.5–5.1)
Sodium: 139 mmol/L (ref 135–145)
Total Bilirubin: 0.4 mg/dL (ref 0.3–1.2)
Total Protein: 6.4 g/dL — ABNORMAL LOW (ref 6.5–8.1)

## 2021-03-28 LAB — CBC WITH DIFFERENTIAL/PLATELET
Abs Immature Granulocytes: 0.08 10*3/uL — ABNORMAL HIGH (ref 0.00–0.07)
Basophils Absolute: 0.1 10*3/uL (ref 0.0–0.1)
Basophils Relative: 1 %
Eosinophils Absolute: 0.2 10*3/uL (ref 0.0–0.5)
Eosinophils Relative: 2 %
HCT: 32 % — ABNORMAL LOW (ref 36.0–46.0)
Hemoglobin: 9.6 g/dL — ABNORMAL LOW (ref 12.0–15.0)
Immature Granulocytes: 1 %
Lymphocytes Relative: 14 %
Lymphs Abs: 1.5 10*3/uL (ref 0.7–4.0)
MCH: 24.7 pg — ABNORMAL LOW (ref 26.0–34.0)
MCHC: 30 g/dL (ref 30.0–36.0)
MCV: 82.5 fL (ref 80.0–100.0)
Monocytes Absolute: 1 10*3/uL (ref 0.1–1.0)
Monocytes Relative: 9 %
Neutro Abs: 8.1 10*3/uL — ABNORMAL HIGH (ref 1.7–7.7)
Neutrophils Relative %: 73 %
Platelets: 347 10*3/uL (ref 150–400)
RBC: 3.88 MIL/uL (ref 3.87–5.11)
RDW: 17.9 % — ABNORMAL HIGH (ref 11.5–15.5)
WBC: 10.8 10*3/uL — ABNORMAL HIGH (ref 4.0–10.5)
nRBC: 0 % (ref 0.0–0.2)

## 2021-03-28 MED ORDER — SODIUM CHLORIDE 0.9% FLUSH
10.0000 mL | INTRAVENOUS | Status: DC | PRN
  Administered 2021-03-28: 10 mL

## 2021-03-28 MED ORDER — SODIUM CHLORIDE 0.9 % IV SOLN: INTRAVENOUS | Status: AC

## 2021-03-28 MED ORDER — SODIUM CHLORIDE 0.9 % IV SOLN
240.0000 mg | Freq: Once | INTRAVENOUS | Status: AC
  Administered 2021-03-28: 240 mg via INTRAVENOUS
  Filled 2021-03-28: qty 24

## 2021-03-28 MED ORDER — HEPARIN SOD (PORK) LOCK FLUSH 100 UNIT/ML IV SOLN
500.0000 [IU] | Freq: Once | INTRAVENOUS | Status: AC | PRN
  Administered 2021-03-28: 500 [IU]

## 2021-03-28 NOTE — Patient Instructions (Signed)
[facility]  ? Discharge Instructions: ?Thank you for choosing [facility] to provide your oncology and hematology care.  ? ?If you have a lab appointment with the Cancer Center, please go directly to the Cancer Center and check in at the registration area. ?  ?Wear comfortable clothing and clothing appropriate for easy access to any Portacath or PICC line.  ? ?We strive to give you quality time with your provider. You may need to reschedule your appointment if you arrive late (15 or more minutes).  Arriving late affects you and other patients whose appointments are after yours.  Also, if you miss three or more appointments without notifying the office, you may be dismissed from the clinic at the provider?s discretion.    ?  ?For prescription refill requests, have your pharmacy contact our office and allow 72 hours for refills to be completed.   ? ?Today you received the following chemotherapy and/or immunotherapy agents: nivolumab    ?  ?To help prevent nausea and vomiting after your treatment, we encourage you to take your nausea medication as directed. ? ?BELOW ARE SYMPTOMS THAT SHOULD BE REPORTED IMMEDIATELY: ?*FEVER GREATER THAN 100.4 F (38 ?C) OR HIGHER ?*CHILLS OR SWEATING ?*NAUSEA AND VOMITING THAT IS NOT CONTROLLED WITH YOUR NAUSEA MEDICATION ?*UNUSUAL SHORTNESS OF BREATH ?*UNUSUAL BRUISING OR BLEEDING ?*URINARY PROBLEMS (pain or burning when urinating, or frequent urination) ?*BOWEL PROBLEMS (unusual diarrhea, constipation, pain near the anus) ?TENDERNESS IN MOUTH AND THROAT WITH OR WITHOUT PRESENCE OF ULCERS (sore throat, sores in mouth, or a toothache) ?UNUSUAL RASH, SWELLING OR PAIN  ?UNUSUAL VAGINAL DISCHARGE OR ITCHING  ? ?Items with * indicate a potential emergency and should be followed up as soon as possible or go to the Emergency Department if any problems should occur. ? ?Please show the CHEMOTHERAPY ALERT CARD or IMMUNOTHERAPY ALERT CARD at check-in  to the Emergency Department and triage nurse. ? ?Should you have questions after your visit or need to cancel or reschedule your appointment, please contact [facility]  Dept: [phone]  and follow the prompts.  Office hours are 8:00 a.m. to 4:30 p.m. Monday - Friday. Please note that voicemails left after 4:00 p.m. may not be returned until the following business day.  We are closed weekends and major holidays. You have access to a nurse at all times for urgent questions. Please call the main number to the clinic Dept: [phone] and follow the prompts. ? ? ?For any non-urgent questions, you may also contact your provider using MyChart. We now offer e-Visits for anyone 18 and older to request care online for non-urgent symptoms. For details visit [email or web address]. ?  ?Also download the MyChart app! Go to the app store, search "MyChart", open the app, select [facility], and log in with your MyChart username and password. ? ?Due to Covid, a mask is required upon entering the hospital/clinic. If you do not have a mask, one will be given to you upon arrival. For doctor visits, patients may have 1 support person aged 18 or older with them. For treatment visits, patients cannot have anyone with them due to current Covid guidelines and our immunocompromised population.  ? ?

## 2021-04-02 ENCOUNTER — Ambulatory Visit (HOSPITAL_COMMUNITY)
Admission: RE | Admit: 2021-04-02 | Discharge: 2021-04-02 | Disposition: A | Discharge status: Home / Self Care | Payer: Medicare Other | Source: Ambulatory Visit | Attending: Hematology | Admitting: Hematology

## 2021-04-02 ENCOUNTER — Inpatient Hospital Stay: Payer: Medicare Other

## 2021-04-02 ENCOUNTER — Other Ambulatory Visit: Payer: Self-pay

## 2021-04-02 DIAGNOSIS — C642 Malignant neoplasm of left kidney, except renal pelvis: Secondary | ICD-10-CM | POA: Diagnosis not present

## 2021-04-02 DIAGNOSIS — C7951 Secondary malignant neoplasm of bone: Secondary | ICD-10-CM

## 2021-04-02 DIAGNOSIS — Z95828 Presence of other vascular implants and grafts: Secondary | ICD-10-CM

## 2021-04-02 LAB — GLUCOSE, CAPILLARY: Glucose-Capillary: 159 mg/dL — ABNORMAL HIGH (ref 70–99)

## 2021-04-02 MED ORDER — SODIUM CHLORIDE 0.9% FLUSH: 10.0000 mL | Freq: Once | INTRAVENOUS | Status: DC

## 2021-04-02 MED ORDER — FLUDEOXYGLUCOSE F - 18 (FDG) INJECTION
8.0000 | Freq: Once | INTRAVENOUS | Status: AC | PRN
  Administered 2021-04-02: 6.32 via INTRAVENOUS

## 2021-04-02 MED ORDER — HEPARIN SOD (PORK) LOCK FLUSH 100 UNIT/ML IV SOLN: 500.0000 [IU] | Freq: Once | INTRAVENOUS | Status: DC

## 2021-04-02 NOTE — Patient Instructions (Signed)

## 2021-04-06 ENCOUNTER — Inpatient Hospital Stay (HOSPITAL_BASED_OUTPATIENT_CLINIC_OR_DEPARTMENT_OTHER): Payer: Medicare Other | Admitting: Hematology

## 2021-04-06 DIAGNOSIS — C642 Malignant neoplasm of left kidney, except renal pelvis: Secondary | ICD-10-CM | POA: Diagnosis not present

## 2021-04-06 DIAGNOSIS — C7951 Secondary malignant neoplasm of bone: Secondary | ICD-10-CM | POA: Diagnosis not present

## 2021-04-06 NOTE — Progress Notes (Signed)
Audrey [name] Follow up:   DOS .04/06/2021  PCP [name], MD [address] [place] [address]  CC: Follow-up for continued management of metastatic renal cell carcinoma  DIAGNOSIS: metastatic renal cell carcinoma  SUMMARY OF ONCOLOGIC HISTORY: Oncology History  [name], 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[place])  01/10/2017 Initial Diagnosis   [name], 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[place])   01/02/2018 -  Chemotherapy   Patient is on Treatment Plan : RENAL CELL CARCINOMA NIVOLUMAB Q14D     Renal cell carcinoma ([place])  04/11/2017 Initial Diagnosis   Renal cell carcinoma ([place])   01/02/2018 -  Chemotherapy   The patient had nivolumab (OPDIVO) 240 mg in sodium chloride 0.9 % 100 mL chemo infusion, 240 mg, Intravenous, Once, 47 of 50 cycles Administration: 240 mg (01/02/2018), 240 mg (01/15/2018), 240 mg (01/30/2018), 240 mg (02/13/2018), 240 mg (02/27/2018), 240 mg (03/13/2018), 240 mg (03/27/2018), 240 mg (04/10/2018), 240 mg (04/24/2018), 240 mg (05/08/2018), 240 mg (05/22/2018), 240 mg (06/05/2018), 240 mg (06/19/2018), 240 mg (08/28/2018), 240 mg (09/11/2018), 240 mg (10/09/2018), 240 mg (10/22/2018), 240 mg (11/06/2018), 240 mg (11/20/2018), 240 mg (12/04/2018), 240 mg (12/18/2018), 240 mg (01/01/2019), 240 mg (01/15/2019), 240 mg (01/29/2019), 240 mg (02/12/2019), 240 mg (02/26/2019), 240 mg (03/12/2019), 240 mg (03/26/2019), 240 mg (04/09/2019), 240 mg (04/28/2019), 240 mg (05/12/2019), 240 mg (05/27/2019), 240 mg (06/09/2019), 240 mg (06/23/2019), 240 mg (07/07/2019), 240 mg (07/21/2019), 240 mg (08/04/2019), 240 mg (08/18/2019), 240 mg (09/03/2019), 240 mg (09/15/2019), 240 mg (09/29/2019), 240 mg (10/12/2019), 240 mg (10/27/2019), 240 mg (11/10/2019), 240 mg (12/08/2019), 240 mg (12/22/2019), 240 mg (01/19/2020)   for chemotherapy treatment.       CURRENT THERAPY: metastatic renal cell carcinoma, [place] See and Nivolumab  INTERVAL HISTORY:  Audrey [name] 80 y.o. female had a phone visit with [name] today for continued evaluation  and management of her metastatic renal cell carcinoma. Patient's location Home Physician location [place] Chief complaint -follow-up of metastatic renal cell carcinoma and discussion of PET CT scan.  Patient notes he recently had a foot sore with cellulitis that she is receiving antibiotics for by her podiatrist with improvement. She continues to have issues with her peripheral arterial disease. No fevers no chills no night sweats.  No other acute new focal symptoms. PET CT scan done on 04/02/2021 was reviewed with the patient and showed no evidence of local recurrence or hypermetabolic metastatic disease.  She notes no prohibitive toxicities from nivolumab immunotherapy at this time.   Patient Active Problem List   Diagnosis Date Noted   Incisional hernia 06/23/2020   Gastritis and gastroduodenitis 02/26/2020   Ventral hernia without obstruction or gangrene 02/26/2020   Rectal discomfort 02/26/2020   History of colonic polyps 02/26/2020   History of cholecystectomy 07/17/2019   Anemia 10/10/2018   Chronic cholecystitis 09/04/2018   Acute cholecystitis 08/29/2018   Constipation 08/23/2018   Abdominal pain 08/23/2018   Biliary stricture 08/23/2018   Preop cardiovascular exam 08/14/2018   Coronary artery disease involving native coronary artery of native heart without angina pectoris 08/14/2018   Dyslipidemia 08/14/2018   Abnormal LFTs 08/05/2018   Choledocholithiasis 08/05/2018   Dilated bile duct 08/05/2018   History of ERCP 08/05/2018   History of renal cell carcinoma 08/05/2018   Port-A-Cath in place 01/30/2018   Counseling regarding advance care planning and goals of care 01/04/2018   Renal cell carcinoma ([place]) 04/11/2017   [name], 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[place]) 01/10/2017   Left renal mass 08/01/2016  is allergic to doxycycline, adhesive [tape], nsaids, and statins.  MEDICAL HISTORY: Past Medical History:  Diagnosis Date   Anemia    Arthritis     lower back, hips, hands   Biliary stricture    Diabetes mellitus ([place])    type 2    Early cataracts, bilateral    Md just watching   Elevated liver enzymes    Family history of adverse reaction to anesthesia    Daughter hard to wake up   Gallstones    GERD (gastroesophageal reflux disease)    occasional - diet controlled   History of blood transfusion 2018   History of hiatal hernia    HTN (hypertension)    Hyperlipidemia    Hypothyroidism    left renal ca dx'd 2018   renal [name] - left kidney removed, pill chemo x 1 yr   Myocardial infarction ([place]) 1991   no deficits   SVD (spontaneous vaginal delivery)    x 3   Wears glasses     SURGICAL HISTORY: Past Surgical History:  Procedure Laterality Date   BALLOON DILATION N/A 07/23/2018   Procedure: BALLOON DILATION;  Surgeon: Mansouraty, Telford Nab., MD;  Location: [place];  Service: Gastroenterology;  Laterality: N/A;   BILIARY BRUSHING  08/10/2018   Procedure: BILIARY BRUSHING;  Surgeon: Rush Landmark Telford Nab., MD;  Location: [place];  Service: Gastroenterology;;   BILIARY BRUSHING  11/30/2018   Procedure: BILIARY BRUSHING;  Surgeon: Irving Copas., MD;  Location: [place];  Service: Gastroenterology;;   BILIARY DILATION  08/10/2018   Procedure: BILIARY DILATION;  Surgeon: Irving Copas., MD;  Location: [place];  Service: Gastroenterology;;   BILIARY DILATION  08/27/2018   Procedure: BILIARY DILATION;  Surgeon: Irving Copas., MD;  Location: [place];  Service: Gastroenterology;;   BILIARY DILATION  01/24/2020   Procedure: BILIARY DILATION;  Surgeon: Irving Copas., MD;  Location: [place];  Service: Gastroenterology;;   BILIARY STENT PLACEMENT  08/10/2018   Procedure: BILIARY STENT PLACEMENT;  Surgeon: Irving Copas., MD;  Location: [place];  Service: Gastroenterology;;   BILIARY STENT PLACEMENT  08/27/2018   Procedure: BILIARY STENT PLACEMENT;  Surgeon:  Irving Copas., MD;  Location: [place];  Service: Gastroenterology;;   BILIARY STENT PLACEMENT  11/30/2018   Procedure: BILIARY STENT PLACEMENT;  Surgeon: Irving Copas., MD;  Location: [place];  Service: Gastroenterology;;   BIOPSY  07/23/2018   Procedure: BIOPSY;  Surgeon: Irving Copas., MD;  Location: [place];  Service: Gastroenterology;;   BIOPSY  01/24/2020   Procedure: BIOPSY;  Surgeon: Irving Copas., MD;  Location: [place];  Service: Gastroenterology;;   CHOLECYSTECTOMY N/A 09/02/2018   Procedure: LAPAROSCOPIC CHOLECYSTECTOMY;  Surgeon: Stark Klein, MD;  Location: [place];  Service: General;  Laterality: N/A;   COLONOSCOPY     normal    ENDOSCOPIC RETROGRADE CHOLANGIOPANCREATOGRAPHY (ERCP) WITH PROPOFOL N/A 08/10/2018   Procedure: ENDOSCOPIC RETROGRADE CHOLANGIOPANCREATOGRAPHY (ERCP) WITH PROPOFOL;  Surgeon: Irving Copas., MD;  Location: [place];  Service: Gastroenterology;  Laterality: N/A;   ENDOSCOPIC RETROGRADE CHOLANGIOPANCREATOGRAPHY (ERCP) WITH PROPOFOL N/A 11/30/2018   Procedure: ENDOSCOPIC RETROGRADE CHOLANGIOPANCREATOGRAPHY (ERCP) WITH PROPOFOL;  Surgeon: Rush Landmark Telford Nab., MD;  Location: [place];  Service: Gastroenterology;  Laterality: N/A;   ENDOSCOPIC RETROGRADE CHOLANGIOPANCREATOGRAPHY (ERCP) WITH PROPOFOL N/A 01/24/2020   Procedure: ENDOSCOPIC RETROGRADE CHOLANGIOPANCREATOGRAPHY (ERCP) WITH PROPOFOL;  Surgeon: Rush Landmark Telford Nab., MD;  Location: [place];  Service: Gastroenterology;  Laterality: N/A;   ERCP N/A 07/23/2018  Procedure: ENDOSCOPIC RETROGRADE CHOLANGIOPANCREATOGRAPHY (ERCP);  Surgeon: Irving Copas., MD;  Location: [place];  Service: Gastroenterology;  Laterality: N/A;   ERCP N/A 08/27/2018   Procedure: ENDOSCOPIC RETROGRADE CHOLANGIOPANCREATOGRAPHY (ERCP);  Surgeon: Irving Copas., MD;  Location: [place];  Service: Gastroenterology;  Laterality: N/A;    ESOPHAGOGASTRODUODENOSCOPY N/A 01/24/2020   Procedure: ESOPHAGOGASTRODUODENOSCOPY (EGD);  Surgeon: Irving Copas., MD;  Location: [place];  Service: Gastroenterology;  Laterality: N/A;   ESOPHAGOGASTRODUODENOSCOPY (EGD) WITH PROPOFOL N/A 08/27/2018   Procedure: ESOPHAGOGASTRODUODENOSCOPY (EGD) WITH PROPOFOL;  Surgeon: Rush Landmark Telford Nab., MD;  Location: [place];  Service: Gastroenterology;  Laterality: N/A;   EUS N/A 08/27/2018   Procedure: ESOPHAGEAL ENDOSCOPIC ULTRASOUND (EUS) RADIAL;  Surgeon: Rush Landmark Telford Nab., MD;  Location: [place];  Service: Gastroenterology;  Laterality: N/A;   FINE NEEDLE ASPIRATION  08/27/2018   Procedure: FINE NEEDLE ASPIRATION (FNA) LINEAR;  Surgeon: Irving Copas., MD;  Location: [place];  Service: Gastroenterology;;   Fatima Blank HERNIA REPAIR N/A 06/23/2020   Procedure: [place];  Surgeon: Felicie Morn, MD;  Location: [facility];  Service: General;  Laterality: N/A;  ROOM 2 STARTING AT 11:00AM FOR 90 MIN   IR IMAGING GUIDED PORT INSERTION  01/08/2018   LAPAROSCOPIC NEPHRECTOMY Left 08/01/2016   Procedure: LAPAROSCOPIC  RADICAL NEPHRECTOMY/ REPAIR OF UMBILICAL HERNIA;  Surgeon: Raynelle Bring, MD;  Location: [facility];  Service: Urology;  Laterality: Left;   REMOVAL OF STONES  07/23/2018   Procedure: REMOVAL OF GALL STONES;  Surgeon: Rush Landmark Telford Nab., MD;  Location: [place];  Service: Gastroenterology;;   REMOVAL OF STONES  08/10/2018   Procedure: REMOVAL OF STONES;  Surgeon: Irving Copas., MD;  Location: [place];  Service: Gastroenterology;;   REMOVAL OF STONES  08/27/2018   Procedure: REMOVAL OF STONES;  Surgeon: Irving Copas., MD;  Location: [place];  Service: Gastroenterology;;   REMOVAL OF STONES  01/24/2020   Procedure: REMOVAL OF STONES;  Surgeon: Irving Copas., MD;  Location: [place];  Service: Gastroenterology;;   [name]  07/23/2018    Procedure: [name];  Surgeon: Irving Copas., MD;  Location: [place];  Service: Gastroenterology;;   Lavell Islam REMOVAL  08/27/2018   Procedure: STENT REMOVAL;  Surgeon: Irving Copas., MD;  Location: [place];  Service: Gastroenterology;;   Lavell Islam REMOVAL  11/30/2018   Procedure: STENT REMOVAL;  Surgeon: Irving Copas., MD;  Location: [place];  Service: Gastroenterology;;   STENT REMOVAL  01/24/2020   Procedure: STENT REMOVAL;  Surgeon: Irving Copas., MD;  [facility];  Service: Gastroenterology;;   UPPER GI ENDOSCOPY     x 1   VAGINAL PROLAPSE REPAIR  11/18/2019   [facility]    SOCIAL HISTORY: Social History   Socioeconomic History   Marital status: Married    Spouse name: Not on file   Number of children: 3   Years of education: Not on file   Highest education level: Not on file  Occupational History   Occupation: retired  Tobacco Use   Smoking status: Former    Packs/day: 1.00    Years: 8.00    Pack years: 8.00    Types: Cigarettes    Quit date: 06/18/1964    Years since quitting: 56.8   Smokeless tobacco: Never  Vaping Use   Vaping Use: Never used  Substance and Sexual Activity   Alcohol use: No   Drug use: No   Sexual activity: Not Currently  Birth control/protection: Post-menopausal  Other Topics Concern   Not on file  Social History Narrative   Married.  Three children   Social Determinants of Health   Financial Resource Strain: Not on file  Food Insecurity: Not on file  Transportation Needs: Not on file  Physical Activity: Not on file  Stress: Not on file  Social Connections: Not on file  Intimate Partner Violence: Not on file    FAMILY HISTORY: Family History  Problem Relation Age of Onset   Heart attack Father 9   Heart disease Brother 13       CABG   Colon [name] Neg Hx    Esophageal [name] Neg Hx    Inflammatory bowel disease Neg Hx    Liver disease Neg Hx    Pancreatic [name]  Neg Hx    Rectal [name] Neg Hx    Stomach [name] Neg Hx      ROS .10 Point review of Systems was done is negative except as noted above.  PHYSICAL EXAMINATION Telemedicine visit.  LABORATORY DATA:  CBC Latest Ref Rng & Units 03/28/2021 03/14/2021  WBC 4.0 - 10.5 K/uL 10.8(H) 9.2  Hemoglobin 12.0 - 15.0 g/dL 9.6(L) 10.7(L)  Hematocrit 36.0 - 46.0 % 32.0(L) 35.0(L)  Platelets 150 - 400 K/uL 347 315   . CMP Latest Ref Rng & Units 03/28/2021 03/14/2021  Glucose 70 - 99 mg/dL 152(H) 139(H)  BUN 8 - 23 mg/dL 24(H) 34(H)  Creatinine 0.44 - 1.00 mg/dL 1.80(H) 1.87(H)  Sodium 135 - 145 mmol/L 139 137  Potassium 3.5 - 5.1 mmol/L 4.0 4.5  Chloride 98 - 111 mmol/L 117(H) 115(H)  CO2 22 - 32 mmol/L 13(L) 15(L)  Calcium 8.9 - 10.3 mg/dL 7.8(L) 9.0  Total Protein 6.5 - 8.1 g/dL 6.4(L) 6.7  Total Bilirubin 0.3 - 1.2 mg/dL 0.4 0.4  Alkaline Phos 38 - 126 U/L 58 60  AST 15 - 41 U/L 18 21  ALT 0 - 44 U/L 18 28    RADIOLOGY    CLINICAL DATA:  Subsequent treatment strategy for metastatic renal cell carcinoma.   EXAM: NUCLEAR MEDICINE PET SKULL BASE TO THIGH   TECHNIQUE: 6.32 mCi F-18 FDG was injected intravenously. Full-ring PET imaging was performed from the skull base to thigh after the radiotracer. CT data was obtained and used for attenuation correction and anatomic localization.   Fasting blood glucose: 159 mg/dl   COMPARISON:  PET-CT 08/29/2020 and 02/09/2020.   FINDINGS: Mediastinal blood pool activity: SUV max 2.7   NECK:   No hypermetabolic cervical lymph nodes are identified.There are no lesions of the pharyngeal mucosal space. Activity associated with the muscles of phonation is within physiologic limits and similar to previous study.   Incidental CT findings: Bilateral carotid atherosclerosis.   CHEST:   There are no hypermetabolic mediastinal, hilar or axillary lymph nodes. No hypermetabolic pulmonary activity or suspicious nodularity.   Incidental CT  findings: Right IJ Port-A-Cath extends to the superior cavoatrial junction. There is diffuse atherosclerosis of the aorta, great vessels and coronary arteries. Mild linear scarring at both lung bases appears unchanged.   ABDOMEN/PELVIS:   There is no hypermetabolic activity within the liver, adrenal glands, spleen or pancreas. There is no hypermetabolic nodal activity. Stable appearance of the right kidney. No hypermetabolic activity within the left nephrectomy bed   Incidental CT findings: Diffuse aortic and branch vessel atherosclerosis, scattered colonic diverticulosis and pelvic floor laxity are again noted.   SKELETON:   There is no hypermetabolic activity  to suggest osseous metastatic disease.   Incidental CT findings: Grossly stable sclerotic lesions within the thoracic spine, without hypermetabolic activity. Lower lumbar spondylosis and sacroiliac degenerative changes bilaterally.   IMPRESSION: 1. Stable PET-CT status post left nephrectomy. No evidence of local recurrence or hypermetabolic metastatic disease. 2. Stable incidental findings including coronary and Aortic Atherosclerosis (ICD10-I70.0). Pelvic floor laxity.     Electronically Signed   By: Richardean Sale M.D.   On: 04/03/2021 12:30      ASSESSMENT and PLAN:   80 y.o. 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 PET/CT 06/19/2017 -- consistent with partial metabolic response to treatment.   Rt adrenal gland bx - showed clear cell RCC   S/p CYtoreductive left radical nephrectomy and left adrenal gland resection on 08/01/2016 by Dr Alinda Money.   10/16/17 PET/CT revealed Continued improvement, with the T7 metastatic lesion no longer significantly hypermetabolic. The previous right lower lobe pulmonary nodule is even less apparent, perhaps about 2 mm in diameter today; given that  this measured 6 mm on 03/28/2017 this probably represents an effectively treated metastatic lesion. Currently no appreciable hypermetabolic activity is identified to suggest active malignancy. Distended gallbladder with gallbladder wall thickening and gallstones. Correlate clinically in assessing for cholecystitis. Small but abnormal amount of free pelvic fluid, nonspecific. Other imaging findings of potential clinical significance: Chronic ethmoid sinusitis. Aortic Atherosclerosis. Stable 5 mm right middle lobe pulmonary nodule, not hypermetabolic but below sensitive PET-CT size thresholds. Left nephrectomy. Notable pelvic floor laxity with cystocele. Chronic bilateral Sacroiliitis.   04/03/18 PET/CT revealed No evidence for new or progressive hypermetabolic disease on today's study to suggest metastatic progression. 2. Stable appearance of the T7 metastatic lesion without Hypermetabolism. 3. Tiny focus of FDG accumulation identified along the skin of the low right inguinal fold. No associated lesion evident on CT. This may be related to urinary contaminant. 4. Cholelithiasis with similar appearance of diffuse gallbladder wall thickening. 5. Stable 5 mm right middle lobe pulmonary nodule. 6.  Aortic Atherosclerois. 7. Diffuse colonic diverticulosis.    09/18/18 CT A/P revealed "Common duct stent in place. Improvement to resolution in biliary duct dilatation compared to 08/29/2018. Interval cholecystectomy without acute complication. 2. Left nephrectomy, without evidence of metastatic disease. 3.  Tiny hiatal hernia. 4. Coronary artery atherosclerosis. Aortic Atherosclerosis. 5. Mild limitations secondary to lack of IV contrast. 6. Marked pelvic floor laxity with cystocele and rectal prolapse".   07/02/2019 PET/CT ([id number]) which revealed "1. 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   #3  Small pulmonary lesions -- 10/16/17 PET/CT showed pulmonary less apparent than 03/28/17 PET/CT, decreasing from 67mm to 4mm diameter.   MRI brain shows no evidence of metastatic disease   #4 T7/8 Bone metastases - received Xgeva every 4 weeks from May 2018 to June 2019. 10/16/17 PET/CT revealed improvement with T7 metastatic lesion no longer significantly hypermetabolic.  -on Marchelle Folks   #5 ?liver mets- rpt PET/CT from 10/16/2017 shows no overt evidence of metastatic disease in the liver.   #6 Grade 1 Nausea - improved and intermittent. Hasnt used her anti-emetic as instructed so some nausea and decreased po intake.   #7 Grade 1 Diarrhea - resolved   #8 Hyponatremia -  resolved with sodium at 136 - likely related to some element  of adrenal insufficiency, diarrhea, limited by mouth intake. Primarily solute free fluid intake.    #9 Acute on chronic renal insufficiency Creatine stable 1.44   #10 Hyperkalemia due to ACEI + RF- resolved   #11 Hemorrhoids -chronic with some bleeding --Recommended Sitz bath and OTC Anusol or Nupercaine for her hemorrhoid relief. -f/u with PCP for continued mx   #12 Hypothyroidism/Adrenal insufficiency/Diabetes -Continue being followed by Dr. Buddy Duty   #14 HTN - ?control. Patient tends to be anxious and has higher blood pressures in the clinic. She can have increased blood pressure from [place] as well. Plan: -continued close f/u with her PCP /cardiology regarding the many elements necessary for her care that are not directly related to her oncology care. -ACEI held due to AKI and hyperkalemia - following with cardiology to mx this. Has been started on Amlodipine instead.   #15 CAD - positive cardiac nuclear stress test Following with cardiology and nephrology    #16 h/o Choledocholithiasis and cholelithiasis 07/10/18 US Abdomen revealed Biliary duct dilatation with 8 mm calculus at the level of the ampulla in the distal common bile duct. 2. Cholelithiasis. No  gallbladder wall thickening or pericholecystic fluid. 3. Appearance of the liver raises concern for underlying hepatic cirrhosis. No focal liver lesions are demonstrable. 4.  Left kidney absent. 5.  Small cysts in right kidney. 6.  Aortic Atherosclerosis.   S/p ERCP on 07/23/18 with Dr. Valarie Merino Mansouraty   09/02/18 Cytopathology from ERCP was suspicious for malignant cells in bile duct, but was not definitive, other two findings were benign.   # 17 Peripheral arterial disease -- following with cardiology/vascular surgery PLAN:   -Patient was called and we discussed her PET CT scan result which shows no evidence of progression of metastatic renal cell carcinoma at this time.  No visible hypermetabolic lesions or evidence of local recurrence. -Patient has no prohibitive toxicities from her immunotherapy with nivolumab. -She is having ischemic ulcer on her foot with cellulitis which is being managed with antibiotics and treated by podiatry. -We will continue current treatment with nivolumab every 2 weeks. -Patient to follow-up with her primary care physician for management of her multiple other medical comorbidities.   FOLLOW UP: Continue nivolumab with port flush and labs every 2 weeks x 8 doses Return to clinic with [name] in 4 weeks with her scheduled treatment  .Brunetta Genera MD

## 2021-04-07 ENCOUNTER — Encounter: Payer: Self-pay | Admitting: Hematology

## 2021-04-09 ENCOUNTER — Encounter: Payer: Self-pay | Admitting: Hematology

## 2021-04-09 NOTE — Progress Notes (Signed)
Contacted Audrey [name] in reference to her My chart message. Audrey [name] having increased pain in legs and feet. Informed Audrey [name] that Dr Irene Limbo is out of town and to reach out to her PCP since she does not have anything current on file that she has been taking for pain. Audrey [name] acknowledged and agreed.

## 2021-04-10 ENCOUNTER — Telehealth: Payer: Self-pay | Admitting: Hematology

## 2021-04-10 NOTE — Telephone Encounter (Signed)
Scheduled follow-up appointments per 12/16 los. Patient is aware.

## 2021-04-11 ENCOUNTER — Inpatient Hospital Stay: Payer: Medicare Other

## 2021-04-11 ENCOUNTER — Other Ambulatory Visit: Payer: Self-pay

## 2021-04-11 VITALS — BP 127/59 | HR 64 | Temp 97.8°F | Resp 16

## 2021-04-11 DIAGNOSIS — Z7189 Other specified counseling: Secondary | ICD-10-CM

## 2021-04-11 DIAGNOSIS — C7951 Secondary malignant neoplasm of bone: Secondary | ICD-10-CM

## 2021-04-11 DIAGNOSIS — Z5112 Encounter for antineoplastic immunotherapy: Secondary | ICD-10-CM | POA: Diagnosis not present

## 2021-04-11 DIAGNOSIS — Z95828 Presence of other vascular implants and grafts: Secondary | ICD-10-CM

## 2021-04-11 DIAGNOSIS — C642 Malignant neoplasm of left kidney, except renal pelvis: Secondary | ICD-10-CM

## 2021-04-11 LAB — CBC WITH DIFFERENTIAL/PLATELET
Abs Immature Granulocytes: 0.07 10*3/uL (ref 0.00–0.07)
Basophils Absolute: 0.1 10*3/uL (ref 0.0–0.1)
Basophils Relative: 1 %
Eosinophils Absolute: 0.2 10*3/uL (ref 0.0–0.5)
Eosinophils Relative: 2 %
HCT: 32.4 % — ABNORMAL LOW (ref 36.0–46.0)
Hemoglobin: 9.7 g/dL — ABNORMAL LOW (ref 12.0–15.0)
Immature Granulocytes: 1 %
Lymphocytes Relative: 14 %
Lymphs Abs: 1.2 10*3/uL (ref 0.7–4.0)
MCH: 24.5 pg — ABNORMAL LOW (ref 26.0–34.0)
MCHC: 29.9 g/dL — ABNORMAL LOW (ref 30.0–36.0)
MCV: 81.8 fL (ref 80.0–100.0)
Monocytes Absolute: 0.8 10*3/uL (ref 0.1–1.0)
Monocytes Relative: 10 %
Neutro Abs: 6.1 10*3/uL (ref 1.7–7.7)
Neutrophils Relative %: 72 %
Platelets: 343 10*3/uL (ref 150–400)
RBC: 3.96 MIL/uL (ref 3.87–5.11)
RDW: 17.3 % — ABNORMAL HIGH (ref 11.5–15.5)
WBC: 8.4 10*3/uL (ref 4.0–10.5)
nRBC: 0 % (ref 0.0–0.2)

## 2021-04-11 LAB — CMP (CANCER CENTER ONLY)
ALT: 15 U/L (ref 0–44)
AST: 18 U/L (ref 15–41)
Albumin: 4 g/dL (ref 3.5–5.0)
Alkaline Phosphatase: 52 U/L (ref 38–126)
Anion gap: 8 (ref 5–15)
BUN: 31 mg/dL — ABNORMAL HIGH (ref 8–23)
CO2: 16 mmol/L — ABNORMAL LOW (ref 22–32)
Calcium: 9 mg/dL (ref 8.9–10.3)
Chloride: 113 mmol/L — ABNORMAL HIGH (ref 98–111)
Creatinine: 2.05 mg/dL — ABNORMAL HIGH (ref 0.44–1.00)
GFR, Estimated: 24 mL/min — ABNORMAL LOW (ref >60)
Glucose, Bld: 143 mg/dL — ABNORMAL HIGH (ref 70–99)
Potassium: 4.4 mmol/L (ref 3.5–5.1)
Sodium: 137 mmol/L (ref 135–145)
Total Bilirubin: 0.4 mg/dL (ref 0.3–1.2)
Total Protein: 6.7 g/dL (ref 6.5–8.1)

## 2021-04-11 MED ORDER — SODIUM CHLORIDE 0.9 % IV SOLN
240.0000 mg | Freq: Once | INTRAVENOUS | Status: AC
  Administered 2021-04-11: 12:00:00 240 mg via INTRAVENOUS
  Filled 2021-04-11: qty 24

## 2021-04-11 MED ORDER — DENOSUMAB 120 MG/1.7ML ~~LOC~~ SOLN
120.0000 mg | Freq: Once | SUBCUTANEOUS | Status: AC
  Administered 2021-04-11: 12:00:00 120 mg via SUBCUTANEOUS
  Filled 2021-04-11: qty 1.7

## 2021-04-11 MED ORDER — SODIUM CHLORIDE 0.9% FLUSH
10.0000 mL | Freq: Once | INTRAVENOUS | Status: AC
Start: 2021-04-11 — End: 2021-04-11
  Administered 2021-04-11: 11:00:00 10 mL

## 2021-04-11 MED ORDER — HEPARIN SOD (PORK) LOCK FLUSH 100 UNIT/ML IV SOLN
500.0000 [IU] | Freq: Once | INTRAVENOUS | Status: AC | PRN
  Administered 2021-04-11: 13:00:00 500 [IU]

## 2021-04-11 MED ORDER — SODIUM CHLORIDE 0.9% FLUSH
10.0000 mL | INTRAVENOUS | Status: DC | PRN
  Administered 2021-04-11: 13:00:00 10 mL

## 2021-04-11 MED ORDER — SODIUM CHLORIDE 0.9 % IV SOLN: INTRAVENOUS | Status: AC

## 2021-04-22 ENCOUNTER — Encounter: Payer: Self-pay | Admitting: Hematology

## 2021-04-25 ENCOUNTER — Inpatient Hospital Stay: Payer: Medicare Other | Attending: Hematology

## 2021-04-25 ENCOUNTER — Other Ambulatory Visit: Payer: Self-pay | Admitting: Hematology

## 2021-04-25 ENCOUNTER — Other Ambulatory Visit: Payer: Self-pay

## 2021-04-25 ENCOUNTER — Inpatient Hospital Stay: Payer: Medicare Other

## 2021-04-25 VITALS — BP 136/76 | HR 66 | Temp 97.9°F | Resp 16 | Wt 130.5 lb

## 2021-04-25 DIAGNOSIS — Z95828 Presence of other vascular implants and grafts: Secondary | ICD-10-CM

## 2021-04-25 DIAGNOSIS — C7951 Secondary malignant neoplasm of bone: Secondary | ICD-10-CM

## 2021-04-25 DIAGNOSIS — Z5112 Encounter for antineoplastic immunotherapy: Secondary | ICD-10-CM | POA: Diagnosis not present

## 2021-04-25 DIAGNOSIS — C642 Malignant neoplasm of left kidney, except renal pelvis: Secondary | ICD-10-CM

## 2021-04-25 DIAGNOSIS — Z7189 Other specified counseling: Secondary | ICD-10-CM

## 2021-04-25 LAB — CMP (CANCER CENTER ONLY)
ALT: 34 U/L (ref 0–44)
AST: 32 U/L (ref 15–41)
Albumin: 3.8 g/dL (ref 3.5–5.0)
Alkaline Phosphatase: 61 U/L (ref 38–126)
Anion gap: 8 (ref 5–15)
BUN: 27 mg/dL — ABNORMAL HIGH (ref 8–23)
CO2: 18 mmol/L — ABNORMAL LOW (ref 22–32)
Calcium: 8.4 mg/dL — ABNORMAL LOW (ref 8.9–10.3)
Chloride: 113 mmol/L — ABNORMAL HIGH (ref 98–111)
Creatinine: 1.79 mg/dL — ABNORMAL HIGH (ref 0.44–1.00)
GFR, Estimated: 28 mL/min — ABNORMAL LOW (ref >60)
Glucose, Bld: 154 mg/dL — ABNORMAL HIGH (ref 70–99)
Potassium: 4.2 mmol/L (ref 3.5–5.1)
Sodium: 139 mmol/L (ref 135–145)
Total Bilirubin: 0.5 mg/dL (ref 0.3–1.2)
Total Protein: 6.6 g/dL (ref 6.5–8.1)

## 2021-04-25 LAB — CBC WITH DIFFERENTIAL/PLATELET
Abs Immature Granulocytes: 0.08 10*3/uL — ABNORMAL HIGH (ref 0.00–0.07)
Basophils Absolute: 0.1 10*3/uL (ref 0.0–0.1)
Basophils Relative: 1 %
Eosinophils Absolute: 0.2 10*3/uL (ref 0.0–0.5)
Eosinophils Relative: 2 %
HCT: 32.1 % — ABNORMAL LOW (ref 36.0–46.0)
Hemoglobin: 9.6 g/dL — ABNORMAL LOW (ref 12.0–15.0)
Immature Granulocytes: 1 %
Lymphocytes Relative: 12 %
Lymphs Abs: 1.1 10*3/uL (ref 0.7–4.0)
MCH: 24.2 pg — ABNORMAL LOW (ref 26.0–34.0)
MCHC: 29.9 g/dL — ABNORMAL LOW (ref 30.0–36.0)
MCV: 80.9 fL (ref 80.0–100.0)
Monocytes Absolute: 0.9 10*3/uL (ref 0.1–1.0)
Monocytes Relative: 9 %
Neutro Abs: 7.3 10*3/uL (ref 1.7–7.7)
Neutrophils Relative %: 75 %
Platelets: 333 10*3/uL (ref 150–400)
RBC: 3.97 MIL/uL (ref 3.87–5.11)
RDW: 16.8 % — ABNORMAL HIGH (ref 11.5–15.5)
WBC: 9.6 10*3/uL (ref 4.0–10.5)
nRBC: 0 % (ref 0.0–0.2)

## 2021-04-25 MED ORDER — SODIUM CHLORIDE 0.9 % IV SOLN
240.0000 mg | Freq: Once | INTRAVENOUS | Status: AC
  Administered 2021-04-25: 240 mg via INTRAVENOUS
  Filled 2021-04-25: qty 24

## 2021-04-25 MED ORDER — SODIUM CHLORIDE 0.9% FLUSH
10.0000 mL | INTRAVENOUS | Status: DC | PRN
  Administered 2021-04-25: 10 mL

## 2021-04-25 MED ORDER — SODIUM CHLORIDE 0.9% FLUSH
10.0000 mL | Freq: Once | INTRAVENOUS | Status: AC
  Administered 2021-04-25: 10 mL

## 2021-04-25 MED ORDER — HEPARIN SOD (PORK) LOCK FLUSH 100 UNIT/ML IV SOLN
500.0000 [IU] | Freq: Once | INTRAVENOUS | Status: AC | PRN
  Administered 2021-04-25: 500 [IU]

## 2021-04-25 MED ORDER — SODIUM CHLORIDE 0.9 % IV SOLN: INTRAVENOUS | Status: AC

## 2021-04-25 NOTE — Patient Instructions (Signed)
[facility]  Discharge Instructions: ?Thank you for choosing [facility] to provide your oncology and hematology care.  ? ?If you have a lab appointment with the Cancer Center, please go directly to the Cancer Center and check in at the registration area. ?  ?Wear comfortable clothing and clothing appropriate for easy access to any Portacath or PICC line.  ? ?We strive to give you quality time with your provider. You may need to reschedule your appointment if you arrive late (15 or more minutes).  Arriving late affects you and other patients whose appointments are after yours.  Also, if you miss three or more appointments without notifying the office, you may be dismissed from the clinic at the provider?s discretion.    ?  ?For prescription refill requests, have your pharmacy contact our office and allow 72 hours for refills to be completed.   ? ?Today you received the following chemotherapy and/or immunotherapy agents Opdivo    ?  ?To help prevent nausea and vomiting after your treatment, we encourage you to take your nausea medication as directed. ? ?BELOW ARE SYMPTOMS THAT SHOULD BE REPORTED IMMEDIATELY: ?*FEVER GREATER THAN 100.4 F (38 ?C) OR HIGHER ?*CHILLS OR SWEATING ?*NAUSEA AND VOMITING THAT IS NOT CONTROLLED WITH YOUR NAUSEA MEDICATION ?*UNUSUAL SHORTNESS OF BREATH ?*UNUSUAL BRUISING OR BLEEDING ?*URINARY PROBLEMS (pain or burning when urinating, or frequent urination) ?*BOWEL PROBLEMS (unusual diarrhea, constipation, pain near the anus) ?TENDERNESS IN MOUTH AND THROAT WITH OR WITHOUT PRESENCE OF ULCERS (sore throat, sores in mouth, or a toothache) ?UNUSUAL RASH, SWELLING OR PAIN  ?UNUSUAL VAGINAL DISCHARGE OR ITCHING  ? ?Items with * indicate a potential emergency and should be followed up as soon as possible or go to the Emergency Department if any problems should occur. ? ?Please show the CHEMOTHERAPY ALERT CARD or IMMUNOTHERAPY ALERT CARD at check-in to the  Emergency Department and triage nurse. ? ?Should you have questions after your visit or need to cancel or reschedule your appointment, please contact [facility]  Dept: [phone]  and follow the prompts.  Office hours are 8:00 a.m. to 4:30 p.m. Monday - Friday. Please note that voicemails left after 4:00 p.m. may not be returned until the following business day.  We are closed weekends and major holidays. You have access to a nurse at all times for urgent questions. Please call the main number to the clinic Dept: [phone] and follow the prompts. ? ? ?For any non-urgent questions, you may also contact your provider using MyChart. We now offer e-Visits for anyone 18 and older to request care online for non-urgent symptoms. For details visit [email or web address]. ?  ?Also download the MyChart app! Go to the app store, search "MyChart", open the app, select [facility], and log in with your MyChart username and password. ? ?Due to Covid, a mask is required upon entering the hospital/clinic. If you do not have a mask, one will be given to you upon arrival. For doctor visits, patients may have 1 support person aged 18 or older with them. For treatment visits, patients cannot have anyone with them due to current Covid guidelines and our immunocompromised population.  ? ?

## 2021-04-25 NOTE — Progress Notes (Signed)
Pt informed RN of wound on her foot. Pt is being seen by her pediatrist for her wound. She also states that she is trying to get into the wound clinic for this as well. Pt also informed RN that she has been experiencing new onset itchiness that she believes is from the antibiotic her pediatrist prescribed her for her wound.  RN made MD and collab RN aware.

## 2021-05-08 ENCOUNTER — Other Ambulatory Visit: Payer: Self-pay

## 2021-05-08 DIAGNOSIS — C642 Malignant neoplasm of left kidney, except renal pelvis: Secondary | ICD-10-CM

## 2021-05-08 DIAGNOSIS — C7951 Secondary malignant neoplasm of bone: Secondary | ICD-10-CM

## 2021-05-09 ENCOUNTER — Inpatient Hospital Stay: Payer: Medicare Other

## 2021-05-09 ENCOUNTER — Other Ambulatory Visit: Payer: Self-pay

## 2021-05-09 ENCOUNTER — Inpatient Hospital Stay (HOSPITAL_BASED_OUTPATIENT_CLINIC_OR_DEPARTMENT_OTHER): Payer: Medicare Other | Admitting: Hematology

## 2021-05-09 ENCOUNTER — Other Ambulatory Visit: Payer: Medicare Other

## 2021-05-09 ENCOUNTER — Ambulatory Visit: Payer: Medicare Other

## 2021-05-09 VITALS — BP 132/64 | HR 76 | Temp 97.0°F | Resp 18 | Wt 129.9 lb

## 2021-05-09 DIAGNOSIS — C642 Malignant neoplasm of left kidney, except renal pelvis: Secondary | ICD-10-CM | POA: Diagnosis not present

## 2021-05-09 DIAGNOSIS — Z5112 Encounter for antineoplastic immunotherapy: Secondary | ICD-10-CM

## 2021-05-09 DIAGNOSIS — C7951 Secondary malignant neoplasm of bone: Secondary | ICD-10-CM | POA: Diagnosis not present

## 2021-05-09 DIAGNOSIS — Z95828 Presence of other vascular implants and grafts: Secondary | ICD-10-CM

## 2021-05-09 DIAGNOSIS — Z7189 Other specified counseling: Secondary | ICD-10-CM

## 2021-05-09 LAB — CBC WITH DIFFERENTIAL/PLATELET
Abs Immature Granulocytes: 0.06 10*3/uL (ref 0.00–0.07)
Basophils Absolute: 0.1 10*3/uL (ref 0.0–0.1)
Basophils Relative: 1 %
Eosinophils Absolute: 0.1 10*3/uL (ref 0.0–0.5)
Eosinophils Relative: 1 %
HCT: 32.2 % — ABNORMAL LOW (ref 36.0–46.0)
Hemoglobin: 9.4 g/dL — ABNORMAL LOW (ref 12.0–15.0)
Immature Granulocytes: 1 %
Lymphocytes Relative: 11 %
Lymphs Abs: 1.1 10*3/uL (ref 0.7–4.0)
MCH: 23.6 pg — ABNORMAL LOW (ref 26.0–34.0)
MCHC: 29.2 g/dL — ABNORMAL LOW (ref 30.0–36.0)
MCV: 80.7 fL (ref 80.0–100.0)
Monocytes Absolute: 0.8 10*3/uL (ref 0.1–1.0)
Monocytes Relative: 8 %
Neutro Abs: 7.9 10*3/uL — ABNORMAL HIGH (ref 1.7–7.7)
Neutrophils Relative %: 78 %
Platelets: 319 10*3/uL (ref 150–400)
RBC: 3.99 MIL/uL (ref 3.87–5.11)
RDW: 16.4 % — ABNORMAL HIGH (ref 11.5–15.5)
WBC: 10 10*3/uL (ref 4.0–10.5)
nRBC: 0 % (ref 0.0–0.2)

## 2021-05-09 LAB — CMP (CANCER CENTER ONLY)
ALT: 33 U/L (ref 0–44)
AST: 31 U/L (ref 15–41)
Albumin: 3.9 g/dL (ref 3.5–5.0)
Alkaline Phosphatase: 67 U/L (ref 38–126)
Anion gap: 7 (ref 5–15)
BUN: 34 mg/dL — ABNORMAL HIGH (ref 8–23)
CO2: 18 mmol/L — ABNORMAL LOW (ref 22–32)
Calcium: 8.4 mg/dL — ABNORMAL LOW (ref 8.9–10.3)
Chloride: 111 mmol/L (ref 98–111)
Creatinine: 2.19 mg/dL — ABNORMAL HIGH (ref 0.44–1.00)
GFR, Estimated: 22 mL/min — ABNORMAL LOW (ref >60)
Glucose, Bld: 131 mg/dL — ABNORMAL HIGH (ref 70–99)
Potassium: 4.2 mmol/L (ref 3.5–5.1)
Sodium: 136 mmol/L (ref 135–145)
Total Bilirubin: 0.5 mg/dL (ref 0.3–1.2)
Total Protein: 6.6 g/dL (ref 6.5–8.1)

## 2021-05-09 LAB — SEDIMENTATION RATE: Sed Rate: 11 mm/hr (ref 0–22)

## 2021-05-09 LAB — C-REACTIVE PROTEIN: CRP: 0.6 mg/dL (ref <1.0)

## 2021-05-09 MED ORDER — HEPARIN SOD (PORK) LOCK FLUSH 100 UNIT/ML IV SOLN
500.0000 [IU] | Freq: Once | INTRAVENOUS | Status: AC | PRN
  Administered 2021-05-09: 500 [IU]

## 2021-05-09 MED ORDER — SODIUM CHLORIDE 0.9 % IV SOLN
240.0000 mg | Freq: Once | INTRAVENOUS | Status: AC
  Administered 2021-05-09: 240 mg via INTRAVENOUS
  Filled 2021-05-09: qty 24

## 2021-05-09 MED ORDER — SODIUM CHLORIDE 0.9 % IV SOLN: INTRAVENOUS | Status: AC

## 2021-05-09 MED ORDER — SODIUM CHLORIDE 0.9% FLUSH
10.0000 mL | Freq: Once | INTRAVENOUS | Status: AC
  Administered 2021-05-09: 10 mL

## 2021-05-09 MED ORDER — SODIUM CHLORIDE 0.9% FLUSH
10.0000 mL | INTRAVENOUS | Status: DC | PRN
  Administered 2021-05-09: 10 mL

## 2021-05-09 NOTE — Progress Notes (Signed)
Pt declined to stay to receive entirety of fluids.

## 2021-05-09 NOTE — Patient Instructions (Signed)
[facility]   ?Discharge Instructions: ?Thank you for choosing [facility] to provide your oncology and hematology care.  ? ?If you have a lab appointment with the Cancer Center, please go directly to the Cancer Center and check in at the registration area. ?  ?Wear comfortable clothing and clothing appropriate for easy access to any Portacath or PICC line.  ? ?We strive to give you quality time with your provider. You may need to reschedule your appointment if you arrive late (15 or more minutes).  Arriving late affects you and other patients whose appointments are after yours.  Also, if you miss three or more appointments without notifying the office, you may be dismissed from the clinic at the provider?s discretion.    ?  ?For prescription refill requests, have your pharmacy contact our office and allow 72 hours for refills to be completed.   ? ?Today you received the following chemotherapy and/or immunotherapy agents: Nivolumab (Opdivo)    ?  ?To help prevent nausea and vomiting after your treatment, we encourage you to take your nausea medication as directed. ? ?BELOW ARE SYMPTOMS THAT SHOULD BE REPORTED IMMEDIATELY: ?*FEVER GREATER THAN 100.4 F (38 ?C) OR HIGHER ?*CHILLS OR SWEATING ?*NAUSEA AND VOMITING THAT IS NOT CONTROLLED WITH YOUR NAUSEA MEDICATION ?*UNUSUAL SHORTNESS OF BREATH ?*UNUSUAL BRUISING OR BLEEDING ?*URINARY PROBLEMS (pain or burning when urinating, or frequent urination) ?*BOWEL PROBLEMS (unusual diarrhea, constipation, pain near the anus) ?TENDERNESS IN MOUTH AND THROAT WITH OR WITHOUT PRESENCE OF ULCERS (sore throat, sores in mouth, or a toothache) ?UNUSUAL RASH, SWELLING OR PAIN  ?UNUSUAL VAGINAL DISCHARGE OR ITCHING  ? ?Items with * indicate a potential emergency and should be followed up as soon as possible or go to the Emergency Department if any problems should occur. ? ?Please show the CHEMOTHERAPY ALERT CARD or IMMUNOTHERAPY ALERT CARD at  check-in to the Emergency Department and triage nurse. ? ?Should you have questions after your visit or need to cancel or reschedule your appointment, please contact [facility]  Dept: [phone]  and follow the prompts.  Office hours are 8:00 a.m. to 4:30 p.m. Monday - Friday. Please note that voicemails left after 4:00 p.m. may not be returned until the following business day.  We are closed weekends and major holidays. You have access to a nurse at all times for urgent questions. Please call the main number to the clinic Dept: [phone] and follow the prompts. ? ? ?For any non-urgent questions, you may also contact your provider using MyChart. We now offer e-Visits for anyone 18 and older to request care online for non-urgent symptoms. For details visit [email or web address]. ?  ?Also download the MyChart app! Go to the app store, search "MyChart", open the app, select , and log in with your MyChart username and password. ? ?Due to Covid, a mask is required upon entering the hospital/clinic. If you do not have a mask, one will be given to you upon arrival. For doctor visits, patients may have 1 support person aged 18 or older with them. For treatment visits, patients cannot have anyone with them due to current Covid guidelines and our immunocompromised population.  ? ?

## 2021-05-10 ENCOUNTER — Telehealth: Payer: Self-pay | Admitting: Hematology

## 2021-05-10 NOTE — Telephone Encounter (Signed)
Scheduled follow-up appointments per 1/18 los. Patient is aware. °

## 2021-05-12 LAB — ZINC: Zinc: 87 ug/dL (ref 44–115)

## 2021-05-12 LAB — VITAMIN C: Vitamin C: <0.1 mg/dL — ABNORMAL LOW (ref 0.4–2.0)

## 2021-05-15 ENCOUNTER — Encounter: Payer: Self-pay | Admitting: Hematology

## 2021-05-15 NOTE — Progress Notes (Addendum)
[facility] up:   DOS .05/09/2021  PCP [name], MD [address] [place] [address]  CC:  Follow-up for continued evaluation and management of metastatic renal cell carcinoma  DIAGNOSIS: metastatic renal cell carcinoma  SUMMARY OF ONCOLOGIC HISTORY: Oncology History  Cancer, 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[place])  01/10/2017 Initial Diagnosis   Cancer, 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[place])   01/02/2018 -  Chemotherapy   Patient is on Treatment Plan : RENAL CELL CARCINOMA NIVOLUMAB Q14D     Renal cell carcinoma ([place])  04/11/2017 Initial Diagnosis   Renal cell carcinoma ([place])   01/02/2018 -  Chemotherapy   The patient had nivolumab (OPDIVO) 240 mg in sodium chloride 0.9 % 100 mL chemo infusion, 240 mg, Intravenous, Once, 47 of 50 cycles Administration: 240 mg (01/02/2018), 240 mg (01/15/2018), 240 mg (01/30/2018), 240 mg (02/13/2018), 240 mg (02/27/2018), 240 mg (03/13/2018), 240 mg (03/27/2018), 240 mg (04/10/2018), 240 mg (04/24/2018), 240 mg (05/08/2018), 240 mg (05/22/2018), 240 mg (06/05/2018), 240 mg (06/19/2018), 240 mg (08/28/2018), 240 mg (09/11/2018), 240 mg (10/09/2018), 240 mg (10/22/2018), 240 mg (11/06/2018), 240 mg (11/20/2018), 240 mg (12/04/2018), 240 mg (12/18/2018), 240 mg (01/01/2019), 240 mg (01/15/2019), 240 mg (01/29/2019), 240 mg (02/12/2019), 240 mg (02/26/2019), 240 mg (03/12/2019), 240 mg (03/26/2019), 240 mg (04/09/2019), 240 mg (04/28/2019), 240 mg (05/12/2019), 240 mg (05/27/2019), 240 mg (06/09/2019), 240 mg (06/23/2019), 240 mg (07/07/2019), 240 mg (07/21/2019), 240 mg (08/04/2019), 240 mg (08/18/2019), 240 mg (09/03/2019), 240 mg (09/15/2019), 240 mg (09/29/2019), 240 mg (10/12/2019), 240 mg (10/27/2019), 240 mg (11/10/2019), 240 mg (12/08/2019), 240 mg (12/22/2019), 240 mg (01/19/2020)   for chemotherapy treatment.       CURRENT THERAPY: metastatic renal cell carcinoma, Delton See and Nivolumab  INTERVAL HISTORY:  Merrilyn Legler Dilauro 81 y.o. female is here for continued evaluation and  management of her metastatic renal cell carcinoma. She notes no new lumps or bumps.  No acute new focal symptoms suggestive of cancer progression. Has had issues with lower extremity claudication from her significant peripheral arterial disease and issues with nonhealing foot ulcer related to diabetes and PAD for which she is following with podiatry.  No other acute new symptoms since her last clinic visit. No rashes no nausea no vomiting no diarrhea. No other reported toxicities from her nivolumab.  Labs done today were reviewed with her.  Patient Active Problem List   Diagnosis Date Noted   Incisional hernia 06/23/2020   Gastritis and gastroduodenitis 02/26/2020   Ventral hernia without obstruction or gangrene 02/26/2020   Rectal discomfort 02/26/2020   History of colonic polyps 02/26/2020   History of cholecystectomy 07/17/2019   Anemia 10/10/2018   Chronic cholecystitis 09/04/2018   Acute cholecystitis 08/29/2018   Constipation 08/23/2018   Abdominal pain 08/23/2018   Biliary stricture 08/23/2018   Preop cardiovascular exam 08/14/2018   Coronary artery disease involving native coronary artery of native heart without angina pectoris 08/14/2018   Dyslipidemia 08/14/2018   Abnormal LFTs 08/05/2018   Choledocholithiasis 08/05/2018   Dilated bile duct 08/05/2018   History of ERCP 08/05/2018   History of renal cell carcinoma 08/05/2018   Port-A-Cath in place 01/30/2018   Counseling regarding advance care planning and goals of care 01/04/2018   Renal cell carcinoma ([place]) 04/11/2017   Cancer, 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[place]) 01/10/2017   Left renal mass 08/01/2016    is allergic to doxycycline, adhesive [tape], nsaids, and statins.  MEDICAL HISTORY: Past Medical History:  Diagnosis Date   Anemia  Arthritis    lower back, hips, hands   Biliary stricture    Diabetes mellitus ([place])    type 2    Early cataracts, bilateral    Md just watching   Elevated liver enzymes    Family  history of adverse reaction to anesthesia    Daughter hard to wake up   Gallstones    GERD (gastroesophageal reflux disease)    occasional - diet controlled   History of blood transfusion 2018   History of hiatal hernia    HTN (hypertension)    Hyperlipidemia    Hypothyroidism    left renal ca dx'd 2018   renal cancer - left kidney removed, pill chemo x 1 yr   Myocardial infarction ([place]) 1991   no deficits   SVD (spontaneous vaginal delivery)    x 3   Wears glasses     SURGICAL HISTORY: Past Surgical History:  Procedure Laterality Date   BALLOON DILATION N/A 07/23/2018   Procedure: BALLOON DILATION;  Surgeon: Mansouraty, Telford Nab., MD;  Location: [place];  Service: Gastroenterology;  Laterality: N/A;   BILIARY BRUSHING  08/10/2018   Procedure: BILIARY BRUSHING;  Surgeon: Rush Landmark Telford Nab., MD;  Location: [facility];  Service: Gastroenterology;;   BILIARY BRUSHING  11/30/2018   Procedure: BILIARY BRUSHING;  Surgeon: Irving Copas., MD;  Location: [place];  Service: Gastroenterology;;   BILIARY DILATION  08/10/2018   Procedure: BILIARY DILATION;  Surgeon: Irving Copas., MD;  Location: [facility];  Service: Gastroenterology;;   BILIARY DILATION  08/27/2018   Procedure: BILIARY DILATION;  Surgeon: Irving Copas., MD;  Location: [place];  Service: Gastroenterology;;   BILIARY DILATION  01/24/2020   Procedure: BILIARY DILATION;  Surgeon: Irving Copas., MD;  Location: [place];  Service: Gastroenterology;;   BILIARY STENT PLACEMENT  08/10/2018   Procedure: BILIARY STENT PLACEMENT;  Surgeon: Irving Copas., MD;  Location: [place];  Service: Gastroenterology;;   BILIARY STENT PLACEMENT  08/27/2018   Procedure: BILIARY STENT PLACEMENT;  Surgeon: Irving Copas., MD;  Location: [place];  Service: Gastroenterology;;   BILIARY STENT PLACEMENT  11/30/2018   Procedure: BILIARY STENT PLACEMENT;  Surgeon:  Irving Copas., MD;  Location: [place];  Service: Gastroenterology;;   BIOPSY  07/23/2018   Procedure: BIOPSY;  Surgeon: Irving Copas., MD;  Location: [place];  Service: Gastroenterology;;   BIOPSY  01/24/2020   Procedure: BIOPSY;  Surgeon: Irving Copas., MD;  Location: [place];  Service: Gastroenterology;;   CHOLECYSTECTOMY N/A 09/02/2018   Procedure: LAPAROSCOPIC CHOLECYSTECTOMY;  Surgeon: Stark Klein, MD;  Location: [place];  Service: General;  Laterality: N/A;   COLONOSCOPY     normal    ENDOSCOPIC RETROGRADE CHOLANGIOPANCREATOGRAPHY (ERCP) WITH PROPOFOL N/A 08/10/2018   Procedure: ENDOSCOPIC RETROGRADE CHOLANGIOPANCREATOGRAPHY (ERCP) WITH PROPOFOL;  Surgeon: Irving Copas., MD;  Location: [place];  Service: Gastroenterology;  Laterality: N/A;   ENDOSCOPIC RETROGRADE CHOLANGIOPANCREATOGRAPHY (ERCP) WITH PROPOFOL N/A 11/30/2018   Procedure: ENDOSCOPIC RETROGRADE CHOLANGIOPANCREATOGRAPHY (ERCP) WITH PROPOFOL;  Surgeon: Rush Landmark Telford Nab., MD;  Location: [place];  Service: Gastroenterology;  Laterality: N/A;   ENDOSCOPIC RETROGRADE CHOLANGIOPANCREATOGRAPHY (ERCP) WITH PROPOFOL N/A 01/24/2020   Procedure: ENDOSCOPIC RETROGRADE CHOLANGIOPANCREATOGRAPHY (ERCP) WITH PROPOFOL;  Surgeon: Rush Landmark Telford Nab., MD;  Location: [place];  Service: Gastroenterology;  Laterality: N/A;   ERCP N/A 07/23/2018   Procedure: ENDOSCOPIC RETROGRADE CHOLANGIOPANCREATOGRAPHY (ERCP);  Surgeon: Irving Copas., MD;  Location: [place];  Service: Gastroenterology;  Laterality: N/A;  ERCP N/A 08/27/2018   Procedure: ENDOSCOPIC RETROGRADE CHOLANGIOPANCREATOGRAPHY (ERCP);  Surgeon: Irving Copas., MD;  Location: [facility];  Service: Gastroenterology;  Laterality: N/A;   ESOPHAGOGASTRODUODENOSCOPY N/A 01/24/2020   Procedure: ESOPHAGOGASTRODUODENOSCOPY (EGD);  Surgeon: Irving Copas., MD;  Location: [place];  Service:  Gastroenterology;  Laterality: N/A;   ESOPHAGOGASTRODUODENOSCOPY (EGD) WITH PROPOFOL N/A 08/27/2018   Procedure: ESOPHAGOGASTRODUODENOSCOPY (EGD) WITH PROPOFOL;  Surgeon: Rush Landmark Telford Nab., MD;  Location: [place];  Service: Gastroenterology;  Laterality: N/A;   EUS N/A 08/27/2018   Procedure: ESOPHAGEAL ENDOSCOPIC ULTRASOUND (EUS) RADIAL;  Surgeon: Rush Landmark Telford Nab., MD;  Location: [place];  Service: Gastroenterology;  Laterality: N/A;   FINE NEEDLE ASPIRATION  08/27/2018   Procedure: FINE NEEDLE ASPIRATION (FNA) LINEAR;  Surgeon: Irving Copas., MD;  Location: [facility] [place];  Service: Gastroenterology;;   Fatima Blank HERNIA REPAIR N/A 06/23/2020   Procedure: [place];  Surgeon: Felicie Morn, MD;  Location: [facility];  Service: General;  Laterality: N/A;  ROOM 2 STARTING AT 11:00AM FOR 90 MIN   IR IMAGING GUIDED PORT INSERTION  01/08/2018   LAPAROSCOPIC NEPHRECTOMY Left 08/01/2016   Procedure: LAPAROSCOPIC  RADICAL NEPHRECTOMY/ REPAIR OF UMBILICAL HERNIA;  Surgeon: Raynelle Bring, MD;  Location[facility];  Service: Urology;  Laterality: Left;   REMOVAL OF STONES  07/23/2018   Procedure: REMOVAL OF GALL STONES;  Surgeon: Rush Landmark Telford Nab., MD;  Location: [place];  Service: Gastroenterology;;   REMOVAL OF STONES  08/10/2018   Procedure: REMOVAL OF STONES;  Surgeon: Irving Copas., MD;  Location: [place];  Service: Gastroenterology;;   REMOVAL OF STONES  08/27/2018   Procedure: REMOVAL OF STONES;  Surgeon: Irving Copas., MD;  Location: [place];  Service: Gastroenterology;;   REMOVAL OF STONES  01/24/2020   Procedure: REMOVAL OF STONES;  Surgeon: Irving Copas., MD;  Location: [place];  Service: Gastroenterology;;   [name]  07/23/2018   Procedure: [name];  Surgeon: Irving Copas., MD;  Location: [facility];  Service: Gastroenterology;;   Lavell Islam REMOVAL  08/27/2018   Procedure:  STENT REMOVAL;  Surgeon: Irving Copas., MD;  Location: [place];  Service: Gastroenterology;;   Lavell Islam REMOVAL  11/30/2018   Procedure: STENT REMOVAL;  Surgeon: Irving Copas., MD;  Location: [place];  Service: Gastroenterology;;   STENT REMOVAL  01/24/2020   Procedure: STENT REMOVAL;  Surgeon: Irving Copas., MD;  Location: [facility];  Service: Gastroenterology;;   UPPER GI ENDOSCOPY     x 1   VAGINAL PROLAPSE REPAIR  11/18/2019   [facility]    SOCIAL HISTORY: Social History   Socioeconomic History   Marital status: Married    Spouse name: Not on file   Number of children: 3   Years of education: Not on file   Highest education level: Not on file  Occupational History   Occupation: retired  Tobacco Use   Smoking status: Former    Packs/day: 1.00    Years: 8.00    Pack years: 8.00    Types: Cigarettes    Quit date: 06/18/1964    Years since quitting: 56.9   Smokeless tobacco: Never  Vaping Use   Vaping Use: Never used  Substance and Sexual Activity   Alcohol use: No   Drug use: No   Sexual activity: Not Currently    Birth control/protection: Post-menopausal  Other Topics Concern   Not on file  Social History Narrative   Married.  Three children  Social Determinants of Health   Financial Resource Strain: Not on file  Food Insecurity: Not on file  Transportation Needs: Not on file  Physical Activity: Not on file  Stress: Not on file  Social Connections: Not on file  Intimate Partner Violence: Not on file    FAMILY HISTORY: Family History  Problem Relation Age of Onset   Heart attack Father 16   Heart disease Brother 7       CABG   Colon cancer Neg Hx    Esophageal cancer Neg Hx    Inflammatory bowel disease Neg Hx    Liver disease Neg Hx    Pancreatic cancer Neg Hx    Rectal cancer Neg Hx    Stomach cancer Neg Hx      ROS .10 Point review of Systems was done is negative except as noted above.   PHYSICAL  EXAMINATION .BP 132/64    Pulse 76    Temp (!) 97 F (36.1 C)    Resp 18    Wt 129 lb 14.4 oz (58.9 kg)    LMP  (LMP Unknown)    SpO2 99%    BMI 26.24 kg/m  . GENERAL:alert, in no acute distress and comfortable SKIN: no acute rashes, no significant lesions EYES: conjunctiva are pink and non-injected, sclera anicteric OROPHARYNX: MMM, no exudates, no oropharyngeal erythema or ulceration NECK: supple, no JVD LYMPH:  no palpable lymphadenopathy in the cervical, axillary or inguinal regions LUNGS: clear to auscultation b/l with normal respiratory effort HEART: regular rate & rhythm ABDOMEN:  normoactive bowel sounds , non tender, not distended. Extremity: no pedal edema PSYCH: alert & oriented x 3 with fluent speech NEURO: no focal motor/sensory deficits    LABORATORY DATA: . CBC Latest Ref Rng & Units 05/23/2021 05/09/2021 04/25/2021  WBC 4.0 - 10.5 K/uL 8.6 10.0 9.6  Hemoglobin 12.0 - 15.0 g/dL 9.7(L) 9.4(L) 9.6(L)  Hematocrit 36.0 - 46.0 % 31.4(L) 32.2(L) 32.1(L)  Platelets 150 - 400 K/uL 348 319 333   . CMP Latest Ref Rng & Units 05/23/2021 05/09/2021 04/25/2021  Glucose 70 - 99 mg/dL 108(H) 131(H) 154(H)  BUN 8 - 23 mg/dL 30(H) 34(H) 27(H)  Creatinine 0.44 - 1.00 mg/dL 2.06(H) 2.19(H) 1.79(H)  Sodium 135 - 145 mmol/L 137 136 139  Potassium 3.5 - 5.1 mmol/L 3.9 4.2 4.2  Chloride 98 - 111 mmol/L 112(H) 111 113(H)  CO2 22 - 32 mmol/L 18(L) 18(L) 18(L)  Calcium 8.9 - 10.3 mg/dL 9.8 8.4(L) 8.4(L)  Total Protein 6.5 - 8.1 g/dL 6.7 6.6 6.6  Total Bilirubin 0.3 - 1.2 mg/dL 0.5 0.5 0.5  Alkaline Phos 38 - 126 U/L 64 67 61  AST 15 - 41 U/L 21 31 32  ALT 0 - 44 U/L 16 33 34      RADIOLOGY    CLINICAL DATA:  Subsequent treatment strategy for metastatic renal cell carcinoma.   EXAM: NUCLEAR MEDICINE PET SKULL BASE TO THIGH   TECHNIQUE: 6.32 mCi F-18 FDG was injected intravenously. Full-ring PET imaging was performed from the skull base to thigh after the radiotracer. CT data was  obtained and used for attenuation correction and anatomic localization.   Fasting blood glucose: 159 mg/dl   COMPARISON:  PET-CT 08/29/2020 and 02/09/2020.   FINDINGS: Mediastinal blood pool activity: SUV max 2.7   NECK:   No hypermetabolic cervical lymph nodes are identified.There are no lesions of the pharyngeal mucosal space. Activity associated with the muscles of phonation is within physiologic limits and  similar to previous study.   Incidental CT findings: Bilateral carotid atherosclerosis.   CHEST:   There are no hypermetabolic mediastinal, hilar or axillary lymph nodes. No hypermetabolic pulmonary activity or suspicious nodularity.   Incidental CT findings: Right IJ Port-A-Cath extends to the superior cavoatrial junction. There is diffuse atherosclerosis of the aorta, great vessels and coronary arteries. Mild linear scarring at both lung bases appears unchanged.   ABDOMEN/PELVIS:   There is no hypermetabolic activity within the liver, adrenal glands, spleen or pancreas. There is no hypermetabolic nodal activity. Stable appearance of the right kidney. No hypermetabolic activity within the left nephrectomy bed   Incidental CT findings: Diffuse aortic and branch vessel atherosclerosis, scattered colonic diverticulosis and pelvic floor laxity are again noted.   SKELETON:   There is no hypermetabolic activity to suggest osseous metastatic disease.   Incidental CT findings: Grossly stable sclerotic lesions within the thoracic spine, without hypermetabolic activity. Lower lumbar spondylosis and sacroiliac degenerative changes bilaterally.   IMPRESSION: 1. Stable PET-CT status post left nephrectomy. No evidence of local recurrence or hypermetabolic metastatic disease. 2. Stable incidental findings including coronary and Aortic Atherosclerosis (ICD10-I70.0). Pelvic floor laxity.     Electronically Signed   By: Richardean Sale M.D.   On: 04/03/2021 12:30       ASSESSMENT and PLAN:   80 y.o. 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 PET/CT 06/19/2017 -- consistent with partial metabolic response to treatment.   Rt adrenal gland bx - showed clear cell RCC   S/p CYtoreductive left radical nephrectomy and left adrenal gland resection on 08/01/2016 by Dr Alinda Money.   10/16/17 PET/CT revealed Continued improvement, with the T7 metastatic lesion no longer significantly hypermetabolic. The previous right lower lobe pulmonary nodule is even less apparent, perhaps about 2 mm in diameter today; given that this measured 6 mm on 03/28/2017 this probably represents an effectively treated metastatic lesion. Currently no appreciable hypermetabolic activity is identified to suggest active malignancy. Distended gallbladder with gallbladder wall thickening and gallstones. Correlate clinically in assessing for cholecystitis. Small but abnormal amount of free pelvic fluid, nonspecific. Other imaging findings of potential clinical significance: Chronic ethmoid sinusitis. Aortic Atherosclerosis. Stable 5 mm right middle lobe pulmonary nodule, not hypermetabolic but below sensitive PET-CT size thresholds. Left nephrectomy. Notable pelvic floor laxity with cystocele. Chronic bilateral Sacroiliitis.   04/03/18 PET/CT revealed No evidence for new or progressive hypermetabolic disease on today's study to suggest metastatic progression. 2. Stable appearance of the T7 metastatic lesion without Hypermetabolism. 3. Tiny focus of FDG accumulation identified along the skin of the low right inguinal fold. No associated lesion evident on CT. This may be related to urinary contaminant. 4. Cholelithiasis with similar appearance of diffuse gallbladder wall thickening. 5. Stable 5 mm right middle lobe pulmonary nodule. 6.  Aortic Atherosclerois. 7. Diffuse colonic  diverticulosis.    09/18/18 CT A/P revealed "Common duct stent in place. Improvement to resolution in biliary duct dilatation compared to 08/29/2018. Interval cholecystectomy without acute complication. 2. Left nephrectomy, without evidence of metastatic disease. 3.  Tiny hiatal hernia. 4. Coronary artery atherosclerosis. Aortic Atherosclerosis. 5. Mild limitations secondary to lack of IV contrast. 6. Marked pelvic floor laxity with cystocele and rectal prolapse".   07/02/2019 PET/CT ([id number]) which revealed "1. 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   #3 Small pulmonary lesions -- 10/16/17 PET/CT showed pulmonary less apparent than 03/28/17 PET/CT, decreasing from 19mm to 13mm diameter.   MRI brain shows no evidence of metastatic disease   #4 T7/8 Bone metastases - received Xgeva every 4 weeks from May 2018 to June 2019. 10/16/17 PET/CT revealed improvement with T7 metastatic lesion no longer significantly hypermetabolic.  -on Marchelle Folks   #5 ?liver mets- rpt PET/CT from 10/16/2017 shows no overt evidence of metastatic disease in the liver.   #6 Acute on chronic renal insufficiency Creatine stable 1.44   #7 Hemorrhoids -chronic with some bleeding --Recommended Sitz bath and OTC Anusol or Nupercaine for her hemorrhoid relief. -f/u with PCP for continued mx   #8 Hypothyroidism/Adrenal insufficiency/Diabetes -Continue being followed by Dr. Buddy Duty   #9 h/o HTN - ?control. Patient tends to be anxious and has higher blood pressures in the clinic.   #10 CAD - positive cardiac nuclear stress test Following with cardiology and nephrology    #11 h/o Choledocholithiasis and cholelithiasis 07/10/18 US Abdomen revealed Biliary duct dilatation with 8 mm calculus at the level of the ampulla in the distal common bile duct. 2. Cholelithiasis. No gallbladder wall thickening or  pericholecystic fluid. 3. Appearance of the liver raises concern for underlying hepatic cirrhosis. No focal liver lesions are demonstrable. 4.  Left kidney absent. 5.  Small cysts in right kidney. 6.  Aortic Atherosclerosis.   S/p ERCP on 07/23/18 with Dr. Valarie Merino Mansouraty   09/02/18 Cytopathology from ERCP was suspicious for malignant cells in bile duct, but was not definitive, other two findings were benign.   # 12 Peripheral arterial disease -- following with cardiology/vascular surgery PLAN:   -Patient's labs were reviewed with her from today as noted above.  Labs are stable. -Patient has no clinical symptoms suggestive of renal cell carcinoma progression at this time. -Patient with no notable toxicities from her current immunotherapy with nivolumab. -Patient appropriate for continued immunotherapy with nivolumab as per current plan. -Continue follow-up with podiatry for management of diabetic foot with ulcer -Continue follow-up with primary care physician for management of multiple other medical comorbidities. -Following up with endocrinology Dr. Buddy Duty for management of renal insufficiency and hypothyroidism.    FOLLOW UP: Continue nivolumab with port flush and labs every 2 weeks x 8 doses Return to clinic with [name] in 6 weeks with her scheduled treatment    .Brunetta Genera MD

## 2021-05-23 ENCOUNTER — Other Ambulatory Visit: Payer: Self-pay

## 2021-05-23 ENCOUNTER — Inpatient Hospital Stay: Payer: Medicare Other | Attending: Hematology

## 2021-05-23 ENCOUNTER — Inpatient Hospital Stay: Payer: Medicare Other

## 2021-05-23 VITALS — BP 131/56 | HR 55 | Temp 97.8°F | Resp 17 | Wt 127.0 lb

## 2021-05-23 DIAGNOSIS — Z5112 Encounter for antineoplastic immunotherapy: Secondary | ICD-10-CM | POA: Insufficient documentation

## 2021-05-23 DIAGNOSIS — C642 Malignant neoplasm of left kidney, except renal pelvis: Secondary | ICD-10-CM | POA: Diagnosis not present

## 2021-05-23 DIAGNOSIS — C7951 Secondary malignant neoplasm of bone: Secondary | ICD-10-CM

## 2021-05-23 DIAGNOSIS — Z7189 Other specified counseling: Secondary | ICD-10-CM

## 2021-05-23 DIAGNOSIS — Z95828 Presence of other vascular implants and grafts: Secondary | ICD-10-CM

## 2021-05-23 DIAGNOSIS — Z79899 Other long term (current) drug therapy: Secondary | ICD-10-CM | POA: Diagnosis not present

## 2021-05-23 LAB — CBC WITH DIFFERENTIAL/PLATELET
Abs Immature Granulocytes: 0.09 10*3/uL — ABNORMAL HIGH (ref 0.00–0.07)
Basophils Absolute: 0.1 10*3/uL (ref 0.0–0.1)
Basophils Relative: 1 %
Eosinophils Absolute: 0.2 10*3/uL (ref 0.0–0.5)
Eosinophils Relative: 2 %
HCT: 31.4 % — ABNORMAL LOW (ref 36.0–46.0)
Hemoglobin: 9.7 g/dL — ABNORMAL LOW (ref 12.0–15.0)
Immature Granulocytes: 1 %
Lymphocytes Relative: 12 %
Lymphs Abs: 1.1 10*3/uL (ref 0.7–4.0)
MCH: 24 pg — ABNORMAL LOW (ref 26.0–34.0)
MCHC: 30.9 g/dL (ref 30.0–36.0)
MCV: 77.7 fL — ABNORMAL LOW (ref 80.0–100.0)
Monocytes Absolute: 0.9 10*3/uL (ref 0.1–1.0)
Monocytes Relative: 10 %
Neutro Abs: 6.4 10*3/uL (ref 1.7–7.7)
Neutrophils Relative %: 74 %
Platelets: 348 10*3/uL (ref 150–400)
RBC: 4.04 MIL/uL (ref 3.87–5.11)
RDW: 17 % — ABNORMAL HIGH (ref 11.5–15.5)
WBC: 8.6 10*3/uL (ref 4.0–10.5)
nRBC: 0 % (ref 0.0–0.2)

## 2021-05-23 LAB — CMP (CANCER CENTER ONLY)
ALT: 16 U/L (ref 0–44)
AST: 21 U/L (ref 15–41)
Albumin: 3.9 g/dL (ref 3.5–5.0)
Alkaline Phosphatase: 64 U/L (ref 38–126)
Anion gap: 7 (ref 5–15)
BUN: 30 mg/dL — ABNORMAL HIGH (ref 8–23)
CO2: 18 mmol/L — ABNORMAL LOW (ref 22–32)
Calcium: 9.8 mg/dL (ref 8.9–10.3)
Chloride: 112 mmol/L — ABNORMAL HIGH (ref 98–111)
Creatinine: 2.06 mg/dL — ABNORMAL HIGH (ref 0.44–1.00)
GFR, Estimated: 24 mL/min — ABNORMAL LOW (ref >60)
Glucose, Bld: 108 mg/dL — ABNORMAL HIGH (ref 70–99)
Potassium: 3.9 mmol/L (ref 3.5–5.1)
Sodium: 137 mmol/L (ref 135–145)
Total Bilirubin: 0.5 mg/dL (ref 0.3–1.2)
Total Protein: 6.7 g/dL (ref 6.5–8.1)

## 2021-05-23 MED ORDER — SODIUM CHLORIDE 0.9% FLUSH
10.0000 mL | INTRAVENOUS | Status: DC | PRN
  Administered 2021-05-23: 10 mL

## 2021-05-23 MED ORDER — SODIUM CHLORIDE 0.9 % IV SOLN: INTRAVENOUS | Status: AC

## 2021-05-23 MED ORDER — SODIUM CHLORIDE 0.9% FLUSH
10.0000 mL | Freq: Once | INTRAVENOUS | Status: AC
  Administered 2021-05-23: 10 mL

## 2021-05-23 MED ORDER — SODIUM CHLORIDE 0.9 % IV SOLN
240.0000 mg | Freq: Once | INTRAVENOUS | Status: AC
  Administered 2021-05-23: 240 mg via INTRAVENOUS
  Filled 2021-05-23: qty 24

## 2021-05-23 MED ORDER — HEPARIN SOD (PORK) LOCK FLUSH 100 UNIT/ML IV SOLN
500.0000 [IU] | Freq: Once | INTRAVENOUS | Status: AC | PRN
  Administered 2021-05-23: 500 [IU]

## 2021-05-23 NOTE — Patient Instructions (Signed)
[facility] MEDICAL [facility]COLOG[facility]   ?Discharge Instructions: ?Thank you for choosing [facility] to provide your oncology and hematology care.  ? ?If you have a lab appointment with the Cancer Center, please go directly to the Cancer Center and check in at the registration area. ?  ?Wear comfortable clothing and clothing appropriate for easy access to any Portacath or PICC line.  ? ?We strive to give you quality time with your provider. You may need to reschedule your appointment if you arrive late (15 or more minutes).  Arriving late affects you and other patients whose appointments are after yours.  Also, if you miss three or more appointments without notifying the office, you may be dismissed from the clinic at the provider?s discretion.    ?  ?For prescription refill requests, have your pharmacy contact our office and allow 72 hours for refills to be completed.   ? ?Today you received the following chemotherapy and/or immunotherapy agents: Nivolumab (Opdivo)    ?  ?To help prevent nausea and vomiting after your treatment, we encourage you to take your nausea medication as directed. ? ?BELOW ARE SYMPTOMS THAT SHOULD BE REPORTED IMMEDIATELY: ?*FEVER GREATER THAN 100.4 F (38 ?C) OR HIGHER ?*CHILLS OR SWEATING ?*NAUSEA AND VOMITING THAT IS NOT CONTROLLED WITH YOUR NAUSEA MEDICATION ?*UNUSUAL SHORTNESS OF BREATH ?*UNUSUAL BRUISING OR BLEEDING ?*URINARY PROBLEMS (pain or burning when urinating, or frequent urination) ?*BOWEL PROBLEMS (unusual diarrhea, constipation, pain near the anus) ?TENDERNESS IN MOUTH AND THROAT WITH OR WITHOUT PRESENCE OF ULCERS (sore throat, sores in mouth, or a toothache) ?UNUSUAL RASH, SWELLING OR PAIN  ?UNUSUAL VAGINAL DISCHARGE OR ITCHING  ? ?Items with * indicate a potential emergency and should be followed up as soon as possible or go to the Emergency Department if any problems should occur. ? ?Please show the CHEMOTHERAPY ALERT CARD or IMMUNOTHERAPY ALERT CARD at  check-in to the Emergency Department and triage nurse. ? ?Should you have questions after your visit or need to cancel or reschedule your appointment, please contact [facility]  Dept: [phone]  and follow the prompts.  Office hours are 8:00 a.m. to 4:30 p.m. Monday - Friday. Please note that voicemails left after 4:00 p.m. may not be returned until the following business day.  We are closed weekends and major holidays. You have access to a nurse at all times for urgent questions. Please call the main number to the clinic Dept: [phone] and follow the prompts. ? ? ?For any non-urgent questions, you may also contact your provider using MyChart. We now offer e-Visits for anyone 18 and older to request care online for non-urgent symptoms. For details visit [email or web address]. ?  ?Also download the MyChart app! Go to the app store, search "MyChart", open the app, select [facility], and log in with your MyChart username and password. ? ?Due to Covid, a mask is required upon entering the hospital/clinic. If you do not have a mask, one will be given to you upon arrival. For doctor visits, patients may have 1 support person aged 18 or older with them. For treatment visits, patients cannot have anyone with them due to current Covid guidelines and our immunocompromised population.  ? ?

## 2021-05-23 NOTE — Progress Notes (Signed)
Patient requested that IV fluids be stopped at 1300.

## 2021-05-28 ENCOUNTER — Encounter: Payer: Self-pay | Admitting: Hematology

## 2021-05-30 ENCOUNTER — Telehealth: Payer: Self-pay

## 2021-05-30 NOTE — Telephone Encounter (Signed)
Contacted pt about MyChart message. Discussed options per Dr Irene Limbo. Pt encouraged to discuss options with her Nephrologist. Pt acknowledged and verbalized understanding.

## 2021-06-06 ENCOUNTER — Inpatient Hospital Stay: Payer: Medicare Other

## 2021-06-06 ENCOUNTER — Other Ambulatory Visit: Payer: Self-pay

## 2021-06-06 VITALS — BP 121/59 | HR 55 | Temp 97.6°F | Resp 18 | Wt 126.5 lb

## 2021-06-06 DIAGNOSIS — Z5112 Encounter for antineoplastic immunotherapy: Secondary | ICD-10-CM | POA: Diagnosis not present

## 2021-06-06 DIAGNOSIS — C642 Malignant neoplasm of left kidney, except renal pelvis: Secondary | ICD-10-CM

## 2021-06-06 DIAGNOSIS — Z95828 Presence of other vascular implants and grafts: Secondary | ICD-10-CM

## 2021-06-06 DIAGNOSIS — Z7189 Other specified counseling: Secondary | ICD-10-CM

## 2021-06-06 DIAGNOSIS — C7951 Secondary malignant neoplasm of bone: Secondary | ICD-10-CM

## 2021-06-06 LAB — CBC WITH DIFFERENTIAL/PLATELET
Abs Immature Granulocytes: 0.04 10*3/uL (ref 0.00–0.07)
Basophils Absolute: 0 10*3/uL (ref 0.0–0.1)
Basophils Relative: 1 %
Eosinophils Absolute: 0.2 10*3/uL (ref 0.0–0.5)
Eosinophils Relative: 2 %
HCT: 30.8 % — ABNORMAL LOW (ref 36.0–46.0)
Hemoglobin: 9 g/dL — ABNORMAL LOW (ref 12.0–15.0)
Immature Granulocytes: 1 %
Lymphocytes Relative: 16 %
Lymphs Abs: 1.1 10*3/uL (ref 0.7–4.0)
MCH: 23.5 pg — ABNORMAL LOW (ref 26.0–34.0)
MCHC: 29.2 g/dL — ABNORMAL LOW (ref 30.0–36.0)
MCV: 80.4 fL (ref 80.0–100.0)
Monocytes Absolute: 0.8 10*3/uL (ref 0.1–1.0)
Monocytes Relative: 12 %
Neutro Abs: 4.8 10*3/uL (ref 1.7–7.7)
Neutrophils Relative %: 68 %
Platelets: 332 10*3/uL (ref 150–400)
RBC: 3.83 MIL/uL — ABNORMAL LOW (ref 3.87–5.11)
RDW: 17.3 % — ABNORMAL HIGH (ref 11.5–15.5)
WBC: 6.9 10*3/uL (ref 4.0–10.5)
nRBC: 0 % (ref 0.0–0.2)

## 2021-06-06 LAB — CMP (CANCER CENTER ONLY)
ALT: 16 U/L (ref 0–44)
AST: 18 U/L (ref 15–41)
Albumin: 3.7 g/dL (ref 3.5–5.0)
Alkaline Phosphatase: 59 U/L (ref 38–126)
Anion gap: 5 (ref 5–15)
BUN: 28 mg/dL — ABNORMAL HIGH (ref 8–23)
CO2: 17 mmol/L — ABNORMAL LOW (ref 22–32)
Calcium: 8.3 mg/dL — ABNORMAL LOW (ref 8.9–10.3)
Chloride: 117 mmol/L — ABNORMAL HIGH (ref 98–111)
Creatinine: 1.72 mg/dL — ABNORMAL HIGH (ref 0.44–1.00)
GFR, Estimated: 30 mL/min — ABNORMAL LOW (ref >60)
Glucose, Bld: 128 mg/dL — ABNORMAL HIGH (ref 70–99)
Potassium: 4.1 mmol/L (ref 3.5–5.1)
Sodium: 139 mmol/L (ref 135–145)
Total Bilirubin: 0.3 mg/dL (ref 0.3–1.2)
Total Protein: 6.2 g/dL — ABNORMAL LOW (ref 6.5–8.1)

## 2021-06-06 MED ORDER — HEPARIN SOD (PORK) LOCK FLUSH 100 UNIT/ML IV SOLN
500.0000 [IU] | Freq: Once | INTRAVENOUS | Status: AC | PRN
  Administered 2021-06-06: 500 [IU]

## 2021-06-06 MED ORDER — DENOSUMAB 120 MG/1.7ML ~~LOC~~ SOLN
120.0000 mg | Freq: Once | SUBCUTANEOUS | Status: AC
  Administered 2021-06-06: 120 mg via SUBCUTANEOUS
  Filled 2021-06-06: qty 1.7

## 2021-06-06 MED ORDER — SODIUM CHLORIDE 0.9% FLUSH
10.0000 mL | Freq: Once | INTRAVENOUS | Status: AC
  Administered 2021-06-06: 10 mL

## 2021-06-06 MED ORDER — SODIUM CHLORIDE 0.9 % IV SOLN
240.0000 mg | Freq: Once | INTRAVENOUS | Status: AC
  Administered 2021-06-06: 240 mg via INTRAVENOUS
  Filled 2021-06-06: qty 24

## 2021-06-06 MED ORDER — SODIUM CHLORIDE 0.9% FLUSH
10.0000 mL | INTRAVENOUS | Status: DC | PRN
  Administered 2021-06-06: 10 mL

## 2021-06-06 MED ORDER — SODIUM CHLORIDE 0.9 % IV SOLN: INTRAVENOUS | Status: AC

## 2021-06-06 NOTE — Progress Notes (Signed)
Ok to give xgeva with calcium of 8.72md/dL per [name].

## 2021-06-06 NOTE — Patient Instructions (Signed)
[facility]   ?Discharge Instructions: ?Thank you for choosing [facility] to provide your oncology and hematology care.  ? ?If you have a lab appointment with the Cancer Center, please go directly to the Cancer Center and check in at the registration area. ?  ?Wear comfortable clothing and clothing appropriate for easy access to any Portacath or PICC line.  ? ?We strive to give you quality time with your provider. You may need to reschedule your appointment if you arrive late (15 or more minutes).  Arriving late affects you and other patients whose appointments are after yours.  Also, if you miss three or more appointments without notifying the office, you may be dismissed from the clinic at the provider?s discretion.    ?  ?For prescription refill requests, have your pharmacy contact our office and allow 72 hours for refills to be completed.   ? ?Today you received the following chemotherapy and/or immunotherapy agents: Nivolumab (Opdivo)    ?  ?To help prevent nausea and vomiting after your treatment, we encourage you to take your nausea medication as directed. ? ?BELOW ARE SYMPTOMS THAT SHOULD BE REPORTED IMMEDIATELY: ?*FEVER GREATER THAN 100.4 F (38 ?C) OR HIGHER ?*CHILLS OR SWEATING ?*NAUSEA AND VOMITING THAT IS NOT CONTROLLED WITH YOUR NAUSEA MEDICATION ?*UNUSUAL SHORTNESS OF BREATH ?*UNUSUAL BRUISING OR BLEEDING ?*URINARY PROBLEMS (pain or burning when urinating, or frequent urination) ?*BOWEL PROBLEMS (unusual diarrhea, constipation, pain near the anus) ?TENDERNESS IN MOUTH AND THROAT WITH OR WITHOUT PRESENCE OF ULCERS (sore throat, sores in mouth, or a toothache) ?UNUSUAL RASH, SWELLING OR PAIN  ?UNUSUAL VAGINAL DISCHARGE OR ITCHING  ? ?Items with * indicate a potential emergency and should be followed up as soon as possible or go to the Emergency Department if any problems should occur. ? ?Please show the CHEMOTHERAPY ALERT CARD or IMMUNOTHERAPY ALERT CARD at  check-in to the Emergency Department and triage nurse. ? ?Should you have questions after your visit or need to cancel or reschedule your appointment, please contact [facility]  Dept: [phone]  and follow the prompts.  Office hours are 8:00 a.m. to 4:30 p.m. Monday - Friday. Please note that voicemails left after 4:00 p.m. may not be returned until the following business day.  We are closed weekends and major holidays. You have access to a nurse at all times for urgent questions. Please call the main number to the clinic Dept: [phone] and follow the prompts. ? ? ?For any non-urgent questions, you may also contact your provider using MyChart. We now offer e-Visits for anyone 18 and older to request care online for non-urgent symptoms. For details visit [email or web address]. ?  ?Also download the MyChart app! Go to the app store, search "MyChart", open the app, select [facility], and log in with your MyChart username and password. ? ?Due to Covid, a mask is required upon entering the hospital/clinic. If you do not have a mask, one will be given to you upon arrival. For doctor visits, patients may have 1 support person aged 18 or older with them. For treatment visits, patients cannot have anyone with them due to current Covid guidelines and our immunocompromised population.  ? ?

## 2021-06-20 ENCOUNTER — Ambulatory Visit: Payer: Medicare Other

## 2021-06-20 ENCOUNTER — Inpatient Hospital Stay: Payer: Medicare Other | Attending: Hematology

## 2021-06-20 ENCOUNTER — Inpatient Hospital Stay (HOSPITAL_BASED_OUTPATIENT_CLINIC_OR_DEPARTMENT_OTHER): Payer: Medicare Other | Admitting: Hematology

## 2021-06-20 ENCOUNTER — Ambulatory Visit: Payer: Medicare Other | Admitting: Hematology

## 2021-06-20 ENCOUNTER — Other Ambulatory Visit: Payer: Self-pay

## 2021-06-20 ENCOUNTER — Other Ambulatory Visit: Payer: Medicare Other

## 2021-06-20 ENCOUNTER — Inpatient Hospital Stay: Payer: Medicare Other

## 2021-06-20 VITALS — BP 141/57 | HR 67 | Temp 97.0°F | Resp 18 | Wt 126.7 lb

## 2021-06-20 DIAGNOSIS — K219 Gastro-esophageal reflux disease without esophagitis: Secondary | ICD-10-CM | POA: Insufficient documentation

## 2021-06-20 DIAGNOSIS — K8062 Calculus of gallbladder and bile duct with acute cholecystitis without obstruction: Secondary | ICD-10-CM | POA: Diagnosis not present

## 2021-06-20 DIAGNOSIS — M461 Sacroiliitis, not elsewhere classified: Secondary | ICD-10-CM | POA: Insufficient documentation

## 2021-06-20 DIAGNOSIS — C7951 Secondary malignant neoplasm of bone: Secondary | ICD-10-CM | POA: Insufficient documentation

## 2021-06-20 DIAGNOSIS — C787 Secondary malignant neoplasm of liver and intrahepatic bile duct: Secondary | ICD-10-CM | POA: Diagnosis not present

## 2021-06-20 DIAGNOSIS — C649 Malignant neoplasm of unspecified kidney, except renal pelvis: Secondary | ICD-10-CM

## 2021-06-20 DIAGNOSIS — E785 Hyperlipidemia, unspecified: Secondary | ICD-10-CM | POA: Insufficient documentation

## 2021-06-20 DIAGNOSIS — E1122 Type 2 diabetes mellitus with diabetic chronic kidney disease: Secondary | ICD-10-CM | POA: Diagnosis not present

## 2021-06-20 DIAGNOSIS — E1136 Type 2 diabetes mellitus with diabetic cataract: Secondary | ICD-10-CM | POA: Insufficient documentation

## 2021-06-20 DIAGNOSIS — I700 Atherosclerosis of aorta: Secondary | ICD-10-CM | POA: Diagnosis not present

## 2021-06-20 DIAGNOSIS — E1151 Type 2 diabetes mellitus with diabetic peripheral angiopathy without gangrene: Secondary | ICD-10-CM | POA: Diagnosis not present

## 2021-06-20 DIAGNOSIS — E119 Type 2 diabetes mellitus without complications: Secondary | ICD-10-CM | POA: Diagnosis not present

## 2021-06-20 DIAGNOSIS — D649 Anemia, unspecified: Secondary | ICD-10-CM | POA: Diagnosis not present

## 2021-06-20 DIAGNOSIS — K449 Diaphragmatic hernia without obstruction or gangrene: Secondary | ICD-10-CM | POA: Insufficient documentation

## 2021-06-20 DIAGNOSIS — K5900 Constipation, unspecified: Secondary | ICD-10-CM | POA: Insufficient documentation

## 2021-06-20 DIAGNOSIS — K8010 Calculus of gallbladder with chronic cholecystitis without obstruction: Secondary | ICD-10-CM | POA: Insufficient documentation

## 2021-06-20 DIAGNOSIS — E039 Hypothyroidism, unspecified: Secondary | ICD-10-CM | POA: Diagnosis not present

## 2021-06-20 DIAGNOSIS — Z7189 Other specified counseling: Secondary | ICD-10-CM

## 2021-06-20 DIAGNOSIS — C7972 Secondary malignant neoplasm of left adrenal gland: Secondary | ICD-10-CM | POA: Insufficient documentation

## 2021-06-20 DIAGNOSIS — Z95828 Presence of other vascular implants and grafts: Secondary | ICD-10-CM

## 2021-06-20 DIAGNOSIS — C642 Malignant neoplasm of left kidney, except renal pelvis: Secondary | ICD-10-CM

## 2021-06-20 DIAGNOSIS — Z9221 Personal history of antineoplastic chemotherapy: Secondary | ICD-10-CM | POA: Insufficient documentation

## 2021-06-20 DIAGNOSIS — Z87891 Personal history of nicotine dependence: Secondary | ICD-10-CM | POA: Diagnosis not present

## 2021-06-20 DIAGNOSIS — I2510 Atherosclerotic heart disease of native coronary artery without angina pectoris: Secondary | ICD-10-CM | POA: Diagnosis not present

## 2021-06-20 DIAGNOSIS — Z5112 Encounter for antineoplastic immunotherapy: Secondary | ICD-10-CM | POA: Diagnosis present

## 2021-06-20 DIAGNOSIS — I10 Essential (primary) hypertension: Secondary | ICD-10-CM | POA: Diagnosis not present

## 2021-06-20 DIAGNOSIS — I252 Old myocardial infarction: Secondary | ICD-10-CM | POA: Diagnosis not present

## 2021-06-20 DIAGNOSIS — Z86010 Personal history of colonic polyps: Secondary | ICD-10-CM | POA: Insufficient documentation

## 2021-06-20 LAB — CBC WITH DIFFERENTIAL/PLATELET
Abs Immature Granulocytes: 0.04 10*3/uL (ref 0.00–0.07)
Basophils Absolute: 0 10*3/uL (ref 0.0–0.1)
Basophils Relative: 1 %
Eosinophils Absolute: 0.1 10*3/uL (ref 0.0–0.5)
Eosinophils Relative: 2 %
HCT: 31.2 % — ABNORMAL LOW (ref 36.0–46.0)
Hemoglobin: 9.3 g/dL — ABNORMAL LOW (ref 12.0–15.0)
Immature Granulocytes: 1 %
Lymphocytes Relative: 22 %
Lymphs Abs: 1.3 10*3/uL (ref 0.7–4.0)
MCH: 23.5 pg — ABNORMAL LOW (ref 26.0–34.0)
MCHC: 29.8 g/dL — ABNORMAL LOW (ref 30.0–36.0)
MCV: 78.8 fL — ABNORMAL LOW (ref 80.0–100.0)
Monocytes Absolute: 0.8 10*3/uL (ref 0.1–1.0)
Monocytes Relative: 14 %
Neutro Abs: 3.6 10*3/uL (ref 1.7–7.7)
Neutrophils Relative %: 60 %
Platelets: 318 10*3/uL (ref 150–400)
RBC: 3.96 MIL/uL (ref 3.87–5.11)
RDW: 17.6 % — ABNORMAL HIGH (ref 11.5–15.5)
WBC: 5.9 10*3/uL (ref 4.0–10.5)
nRBC: 0 % (ref 0.0–0.2)

## 2021-06-20 LAB — CMP (CANCER CENTER ONLY)
ALT: 13 U/L (ref 0–44)
AST: 15 U/L (ref 15–41)
Albumin: 3.8 g/dL (ref 3.5–5.0)
Alkaline Phosphatase: 57 U/L (ref 38–126)
Anion gap: 6 (ref 5–15)
BUN: 20 mg/dL (ref 8–23)
CO2: 19 mmol/L — ABNORMAL LOW (ref 22–32)
Calcium: 8 mg/dL — ABNORMAL LOW (ref 8.9–10.3)
Chloride: 114 mmol/L — ABNORMAL HIGH (ref 98–111)
Creatinine: 1.59 mg/dL — ABNORMAL HIGH (ref 0.44–1.00)
GFR, Estimated: 33 mL/min — ABNORMAL LOW (ref >60)
Glucose, Bld: 102 mg/dL — ABNORMAL HIGH (ref 70–99)
Potassium: 3.8 mmol/L (ref 3.5–5.1)
Sodium: 139 mmol/L (ref 135–145)
Total Bilirubin: 0.4 mg/dL (ref 0.3–1.2)
Total Protein: 6.4 g/dL — ABNORMAL LOW (ref 6.5–8.1)

## 2021-06-20 MED ORDER — HEPARIN SOD (PORK) LOCK FLUSH 100 UNIT/ML IV SOLN
500.0000 [IU] | Freq: Once | INTRAVENOUS | Status: AC | PRN
  Administered 2021-06-20: 500 [IU]

## 2021-06-20 MED ORDER — SODIUM CHLORIDE 0.9% FLUSH
10.0000 mL | INTRAVENOUS | Status: DC | PRN
  Administered 2021-06-20: 10 mL

## 2021-06-20 MED ORDER — SODIUM CHLORIDE 0.9 % IV SOLN: INTRAVENOUS | Status: AC

## 2021-06-20 MED ORDER — SODIUM CHLORIDE 0.9 % IV SOLN
240.0000 mg | Freq: Once | INTRAVENOUS | Status: AC
  Administered 2021-06-20: 240 mg via INTRAVENOUS
  Filled 2021-06-20: qty 24

## 2021-06-20 MED ORDER — SODIUM CHLORIDE 0.9% FLUSH
10.0000 mL | Freq: Once | INTRAVENOUS | Status: AC
  Administered 2021-06-20: 10 mL

## 2021-06-20 NOTE — Patient Instructions (Signed)
[facility]  Discharge Instructions: Thank you for choosing [facility] to provide your oncology and hematology care.   If you have a lab appointment with the [facility], please go directly to the [facility] and check in at the registration area.   Wear comfortable clothing and clothing appropriate for easy access to any Portacath or PICC line.   We strive to give you quality time with your provider. You may need to reschedule your appointment if you arrive late (15 or more minutes).  Arriving late affects you and other patients whose appointments are after yours.  Also, if you miss three or more appointments without notifying the office, you may be dismissed from the clinic at the providers discretion.      For prescription refill requests, have your pharmacy contact our office and allow 72 hours for refills to be completed.    Today you received the following chemotherapy and/or immunotherapy agents: Opdivo     To help prevent nausea and vomiting after your treatment, we encourage you to take your nausea medication as directed.  BELOW ARE SYMPTOMS THAT SHOULD BE REPORTED IMMEDIATELY: *FEVER GREATER THAN 100.4 F (38 C) OR HIGHER *CHILLS OR SWEATING *NAUSEA AND VOMITING THAT IS NOT CONTROLLED WITH YOUR NAUSEA MEDICATION *UNUSUAL SHORTNESS OF BREATH *UNUSUAL BRUISING OR BLEEDING *URINARY PROBLEMS (pain or burning when urinating, or frequent urination) *BOWEL PROBLEMS (unusual diarrhea, constipation, pain near the anus) TENDERNESS IN MOUTH AND THROAT WITH OR WITHOUT PRESENCE OF ULCERS (sore throat, sores in mouth, or a toothache) UNUSUAL RASH, SWELLING OR PAIN  UNUSUAL VAGINAL DISCHARGE OR ITCHING   Items with * indicate a potential emergency and should be followed up as soon as possible or go to the Emergency Department if any problems should occur.  Please show the CHEMOTHERAPY ALERT CARD or IMMUNOTHERAPY ALERT CARD at check-in to the  Emergency Department and triage nurse.  Should you have questions after your visit or need to cancel or reschedule your appointment, please contact [facility]  Dept: [phone]  and follow the prompts.  Office hours are 8:00 a.m. to 4:30 p.m. Monday - Friday. Please note that voicemails left after 4:00 p.m. may not be returned until the following business day.  We are closed weekends and major holidays. You have access to a nurse at all times for urgent questions. Please call the main number to the clinic Dept: [phone] and follow the prompts.   For any non-urgent questions, you may also contact your provider using MyChart. We now offer e-Visits for anyone 17 and older to request care online for non-urgent symptoms. For details visit [email or web address].   Also download the MyChart app! Go to the app store, search "MyChart", open the app, select [facility], and log in with your MyChart username and password.  Due to Covid, a mask is required upon entering the hospital/clinic. If you do not have a mask, one will be given to you upon arrival. For doctor visits, patients may have 1 support person aged 34 or older with them. For treatment visits, patients cannot have anyone with them due to current Covid guidelines and our immunocompromised population.   Rehydration, Adult Rehydration is the replacement of body fluids, salts, and minerals (electrolytes) that are lost during dehydration. Dehydration is when there is not enough water or other fluids in the body. This happens when you lose more fluids than you take in. Common causes of dehydration include: Not drinking  enough fluids. This can occur when you are ill or doing activities that require a lot of energy, especially in hot weather. Conditions that cause loss of water or other fluids, such as diarrhea, vomiting, sweating, or urinating a lot. Other illnesses, such as fever or infection. Certain medicines,  such as those that remove excess fluid from the body (diuretics). Symptoms of mild or moderate dehydration may include thirst, dry lips and mouth, and dizziness. Symptoms of severe dehydration may include increased heart rate, confusion, fainting, and not urinating. For severe dehydration, you may need to get fluids through an IV at the hospital. For mild or moderate dehydration, you can usually rehydrate at home by drinking certain fluids as told by your health care provider. What are the risks? Generally, rehydration is safe. However, taking in too much fluid (overhydration) can be a problem. This is rare. Overhydration can cause an electrolyte imbalance, kidney failure, or a decrease in salt (sodium) levels in the body. Supplies needed You will need an oral rehydration solution (ORS) if your health care provider tells you to use one. This is a drink to treat dehydration. It can be found in pharmacies and retail stores. How to rehydrate Fluids Follow instructions from your health care provider for rehydration. The kind of fluid and the amount you should drink depend on your condition. In general, you should choose drinks that you prefer. If told by your health care provider, drink an ORS. Make an ORS by following instructions on the package. Start by drinking small amounts, about  cup (120 mL) every 5-10 minutes. Slowly increase how much you drink until you have taken the amount recommended by your health care provider. Drink enough clear fluids to keep your urine pale yellow. If you were told to drink an ORS, finish it first, then start slowly drinking other clear fluids. Drink fluids such as: Water. This includes sparkling water and flavored water. Drinking only water can lead to having too little sodium in your body (hyponatremia). Follow the advice of your health care provider. Water from ice chips you suck on. Fruit juice with water you add to it (diluted). Sports drinks. Hot or cold  herbal teas. Broth-based soups. Milk or milk products. Food Follow instructions from your health care provider about what to eat while you rehydrate. Your health care provider may recommend that you slowly begin eating regular foods in small amounts. Eat foods that contain a healthy balance of electrolytes, such as bananas, oranges, potatoes, tomatoes, and spinach. Avoid foods that are greasy or contain a lot of sugar. In some cases, you may get nutrition through a feeding tube that is passed through your nose and into your stomach (nasogastric tube, or NG tube). This may be done if you have uncontrolled vomiting or diarrhea. Beverages to avoid Certain beverages may make dehydration worse. While you rehydrate, avoid drinking alcohol. How to tell if you are recovering from dehydration You may be recovering from dehydration if: You are urinating more often than before you started rehydrating. Your urine is pale yellow. Your energy level improves. You vomit less frequently. You have diarrhea less frequently. Your appetite improves or returns to normal. You feel less dizzy or less light-headed. Your skin tone and color start to look more normal. Follow these instructions at home: Take over-the-counter and prescription medicines only as told by your health care provider. Do not take sodium tablets. Doing this can lead to having too much sodium in your body (hypernatremia). Contact a health care  provider if: You continue to have symptoms of mild or moderate dehydration, such as: Thirst. Dry lips. Slightly dry mouth. Dizziness. Dark urine or less urine than normal. Muscle cramps. You continue to vomit or have diarrhea. Get help right away if you: Have symptoms of dehydration that get worse. Have a fever. Have a severe headache. Have been vomiting and the following happens: Your vomiting gets worse or does not go away. Your vomit includes blood or green matter (bile). You cannot eat or  drink without vomiting. Have problems with urination or bowel movements, such as: Diarrhea that gets worse or does not go away. Blood in your stool (feces). This may cause stool to look black and tarry. Not urinating, or urinating only a small amount of very dark urine, within 6-8 hours. Have trouble breathing. Have symptoms that get worse with treatment. These symptoms may represent a serious problem that is an emergency. Do not wait to see if the symptoms will go away. Get medical help right away. Call your local emergency services (911 in the [place] Do not drive yourself to the hospital. Summary Rehydration is the replacement of body fluids and minerals (electrolytes) that are lost during dehydration. Follow instructions from your health care provider for rehydration. The kind of fluid and amount you should drink depend on your condition. Slowly increase how much you drink until you have taken the amount recommended by your health care provider. Contact your health care provider if you continue to show signs of mild or moderate dehydration. This information is not intended to replace advice given to you by your health care provider. Make sure you discuss any questions you have with your health care provider. Document Revised: 06/09/2019 Document Reviewed: 04/19/2019 Elsevier Patient Education  2022 [employer].

## 2021-06-20 NOTE — Progress Notes (Signed)
Per Dr Irene Limbo no need for TSH today. OK to proceed with tx without TSH.  Will order with next lab draw. ? ?

## 2021-06-26 ENCOUNTER — Encounter: Payer: Self-pay | Admitting: Hematology

## 2021-06-26 NOTE — Progress Notes (Signed)
[facility] up:   DOS .06/20/2021  PCP [name], MD [address], [address] [place] [address]  CC:  Follow-up for continued evaluation and management of metastatic renal cell carcinoma  DIAGNOSIS: metastatic renal cell carcinoma currently in NED status  SUMMARY OF ONCOLOGIC HISTORY: Oncology History  Cancer, 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[place])  01/10/2017 Initial Diagnosis   Cancer, 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[place])   01/02/2018 -  Chemotherapy   Patient is on Treatment Plan : RENAL CELL CARCINOMA NIVOLUMAB Q14D     Renal cell carcinoma ([place])  04/11/2017 Initial Diagnosis   Renal cell carcinoma ([place])   01/02/2018 -  Chemotherapy   The patient had nivolumab (OPDIVO) 240 mg in sodium chloride 0.9 % 100 mL chemo infusion, 240 mg, Intravenous, Once, 47 of 50 cycles Administration: 240 mg (01/02/2018), 240 mg (01/15/2018), 240 mg (01/30/2018), 240 mg (02/13/2018), 240 mg (02/27/2018), 240 mg (03/13/2018), 240 mg (03/27/2018), 240 mg (04/10/2018), 240 mg (04/24/2018), 240 mg (05/08/2018), 240 mg (05/22/2018), 240 mg (06/05/2018), 240 mg (06/19/2018), 240 mg (08/28/2018), 240 mg (09/11/2018), 240 mg (10/09/2018), 240 mg (10/22/2018), 240 mg (11/06/2018), 240 mg (11/20/2018), 240 mg (12/04/2018), 240 mg (12/18/2018), 240 mg (01/01/2019), 240 mg (01/15/2019), 240 mg (01/29/2019), 240 mg (02/12/2019), 240 mg (02/26/2019), 240 mg (03/12/2019), 240 mg (03/26/2019), 240 mg (04/09/2019), 240 mg (04/28/2019), 240 mg (05/12/2019), 240 mg (05/27/2019), 240 mg (06/09/2019), 240 mg (06/23/2019), 240 mg (07/07/2019), 240 mg (07/21/2019), 240 mg (08/04/2019), 240 mg (08/18/2019), 240 mg (09/03/2019), 240 mg (09/15/2019), 240 mg (09/29/2019), 240 mg (10/12/2019), 240 mg (10/27/2019), 240 mg (11/10/2019), 240 mg (12/08/2019), 240 mg (12/22/2019), 240 mg (01/19/2020)   for chemotherapy treatment.       CURRENT THERAPY: metastatic renal cell carcinoma, Delton See and Nivolumab  INTERVAL HISTORY:  Audrey [name] 81 y.o. female is here for  continued evaluation and management of his metastatic renal cell carcinoma.   She notes no acute new symptoms since her last clinic visit.  Continues to follow-up with her primary care physician podiatry for management of her PAD and nonhealing ulcer on the foot. No new reported toxicities from her nivolumab. Labs done today reviewed in details.   Patient Active Problem List   Diagnosis Date Noted   Incisional hernia 06/23/2020   Gastritis and gastroduodenitis 02/26/2020   Ventral hernia without obstruction or gangrene 02/26/2020   Rectal discomfort 02/26/2020   History of colonic polyps 02/26/2020   History of cholecystectomy 07/17/2019   Anemia 10/10/2018   Chronic cholecystitis 09/04/2018   Acute cholecystitis 08/29/2018   Constipation 08/23/2018   Abdominal pain 08/23/2018   Biliary stricture 08/23/2018   Preop cardiovascular exam 08/14/2018   Coronary artery disease involving native coronary artery of native heart without angina pectoris 08/14/2018   Dyslipidemia 08/14/2018   Abnormal LFTs 08/05/2018   Choledocholithiasis 08/05/2018   Dilated bile duct 08/05/2018   History of ERCP 08/05/2018   History of renal cell carcinoma 08/05/2018   Port-A-Cath in place 01/30/2018   Counseling regarding advance care planning and goals of care 01/04/2018   Renal cell carcinoma ([place]) 04/11/2017   Cancer, 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[place]) 01/10/2017   Left renal mass 08/01/2016    is allergic to doxycycline, adhesive [tape], nsaids, and statins.  MEDICAL HISTORY: Past Medical History:  Diagnosis Date   Anemia    Arthritis    lower back, hips, hands   Biliary stricture    Diabetes mellitus ([place])    type 2    Early  cataracts, bilateral    Md just watching   Elevated liver enzymes    Family history of adverse reaction to anesthesia    Daughter hard to wake up   Gallstones    GERD (gastroesophageal reflux disease)    occasional - diet controlled   History of blood transfusion 2018    History of hiatal hernia    HTN (hypertension)    Hyperlipidemia    Hypothyroidism    left renal ca dx'd 2018   renal cancer - left kidney removed, pill chemo x 1 yr   Myocardial infarction ([place]) 1991   no deficits   SVD (spontaneous vaginal delivery)    x 3   Wears glasses     SURGICAL HISTORY: Past Surgical History:  Procedure Laterality Date   BALLOON DILATION N/A 07/23/2018   Procedure: BALLOON DILATION;  Surgeon: Mansouraty, Telford Nab., MD;  Location: [facility];  Service: Gastroenterology;  Laterality: N/A;   BILIARY BRUSHING  08/10/2018   Procedure: BILIARY BRUSHING;  Surgeon: Rush Landmark Telford Nab., MD;  Location: [facility];  Service: Gastroenterology;;   BILIARY BRUSHING  11/30/2018   Procedure: BILIARY BRUSHING;  Surgeon: Irving Copas., MD;  Location: [facility];  Service: Gastroenterology;;   BILIARY DILATION  08/10/2018   Procedure: BILIARY DILATION;  Surgeon: Irving Copas., MD;  Location: [place];  Service: Gastroenterology;;   BILIARY DILATION  08/27/2018   Procedure: BILIARY DILATION;  Surgeon: Irving Copas., MD;  Location: [facility];  Service: Gastroenterology;;   BILIARY DILATION  01/24/2020   Procedure: BILIARY DILATION;  Surgeon: Irving Copas., MD;  Location: [facility];  Service: Gastroenterology;;   BILIARY STENT PLACEMENT  08/10/2018   Procedure: BILIARY STENT PLACEMENT;  Surgeon: Irving Copas., MD;  Location: [place] [facility];  Service: Gastroenterology;;   BILIARY STENT PLACEMENT  08/27/2018   Procedure: BILIARY STENT PLACEMENT;  Surgeon: Irving Copas., MD;  Location: [facility];  Service: Gastroenterology;;   BILIARY STENT PLACEMENT  11/30/2018   Procedure: BILIARY STENT PLACEMENT;  Surgeon: Irving Copas., MD;  Location: [place];  Service: Gastroenterology;;   BIOPSY  07/23/2018   Procedure: BIOPSY;  Surgeon: Irving Copas., MD;  Location: [place];  Service:  Gastroenterology;;   BIOPSY  01/24/2020   Procedure: BIOPSY;  Surgeon: Irving Copas., MD;  Location: [facility];  Service: Gastroenterology;;   CHOLECYSTECTOMY N/A 09/02/2018   Procedure: LAPAROSCOPIC CHOLECYSTECTOMY;  Surgeon: Stark Klein, MD;  Location: [place];  Service: General;  Laterality: N/A;   COLONOSCOPY     normal    ENDOSCOPIC RETROGRADE CHOLANGIOPANCREATOGRAPHY (ERCP) WITH PROPOFOL N/A 08/10/2018   Procedure: ENDOSCOPIC RETROGRADE CHOLANGIOPANCREATOGRAPHY (ERCP) WITH PROPOFOL;  Surgeon: Irving Copas., MD;  Location: [facility];  Service: Gastroenterology;  Laterality: N/A;   ENDOSCOPIC RETROGRADE CHOLANGIOPANCREATOGRAPHY (ERCP) WITH PROPOFOL N/A 11/30/2018   Procedure: ENDOSCOPIC RETROGRADE CHOLANGIOPANCREATOGRAPHY (ERCP) WITH PROPOFOL;  Surgeon: Rush Landmark Telford Nab., MD;  Location: [facility];  Service: Gastroenterology;  Laterality: N/A;   ENDOSCOPIC RETROGRADE CHOLANGIOPANCREATOGRAPHY (ERCP) WITH PROPOFOL N/A 01/24/2020   Procedure: ENDOSCOPIC RETROGRADE CHOLANGIOPANCREATOGRAPHY (ERCP) WITH PROPOFOL;  Surgeon: Rush Landmark Telford Nab., MD;  Location: [facility];  Service: Gastroenterology;  Laterality: N/A;   ERCP N/A 07/23/2018   Procedure: ENDOSCOPIC RETROGRADE CHOLANGIOPANCREATOGRAPHY (ERCP);  Surgeon: Irving Copas., MD;  Location: [place];  Service: Gastroenterology;  Laterality: N/A;   ERCP N/A 08/27/2018   Procedure: ENDOSCOPIC RETROGRADE CHOLANGIOPANCREATOGRAPHY (ERCP);  Surgeon: Irving Copas., MD;  Location: [facility];  Service: Gastroenterology;  Laterality: N/A;  ESOPHAGOGASTRODUODENOSCOPY N/A 01/24/2020   Procedure: ESOPHAGOGASTRODUODENOSCOPY (EGD);  Surgeon: Irving Copas., MD;  Location: [facility];  Service: Gastroenterology;  Laterality: N/A;   ESOPHAGOGASTRODUODENOSCOPY (EGD) WITH PROPOFOL N/A 08/27/2018   Procedure: ESOPHAGOGASTRODUODENOSCOPY (EGD) WITH PROPOFOL;  Surgeon: Rush Landmark Telford Nab., MD;   Location: [facility];  Service: Gastroenterology;  Laterality: N/A;   EUS N/A 08/27/2018   Procedure: ESOPHAGEAL ENDOSCOPIC ULTRASOUND (EUS) RADIAL;  Surgeon: Rush Landmark Telford Nab., MD;  Location: [place];  Service: Gastroenterology;  Laterality: N/A;   FINE NEEDLE ASPIRATION  08/27/2018   Procedure: FINE NEEDLE ASPIRATION (FNA) LINEAR;  Surgeon: Irving Copas., MD;  Location: [facility];  Service: Gastroenterology;;   Fatima Blank HERNIA REPAIR N/A 06/23/2020   Procedure: [place];  Surgeon: Felicie Morn, MD;  Location: [facility];  Service: General;  Laterality: N/A;  ROOM 2 STARTING AT 11:00AM FOR 90 MIN   IR IMAGING GUIDED PORT INSERTION  01/08/2018   LAPAROSCOPIC NEPHRECTOMY Left 08/01/2016   Procedure: LAPAROSCOPIC  RADICAL NEPHRECTOMY/ REPAIR OF UMBILICAL HERNIA;  Surgeon: Raynelle Bring, MD;  Location: [facility];  Service: Urology;  Laterality: Left;   REMOVAL OF STONES  07/23/2018   Procedure: REMOVAL OF GALL STONES;  Surgeon: Rush Landmark Telford Nab., MD;  Location: [facility];  Service: Gastroenterology;;   REMOVAL OF STONES  08/10/2018   Procedure: REMOVAL OF STONES;  Surgeon: Irving Copas., MD;  Location: [facility];  Service: Gastroenterology;;   REMOVAL OF STONES  08/27/2018   Procedure: REMOVAL OF STONES;  Surgeon: Irving Copas., MD;  Location: [facility];  Service: Gastroenterology;;   REMOVAL OF STONES  01/24/2020   Procedure: REMOVAL OF STONES;  Surgeon: Irving Copas., MD;  Location: [place];  Service: Gastroenterology;;   [name]  07/23/2018   Procedure: [name];  Surgeon: Irving Copas., MD;  Location: [facility];  Service: Gastroenterology;;   Lavell Islam REMOVAL  08/27/2018   Procedure: STENT REMOVAL;  Surgeon: Irving Copas., MD;  Location: [facility];  Service: Gastroenterology;;   Lavell Islam REMOVAL  11/30/2018   Procedure: STENT REMOVAL;  Surgeon: Irving Copas.,  MD;  Location: [facility];  Service: Gastroenterology;;   STENT REMOVAL  01/24/2020   Procedure: STENT REMOVAL;  Surgeon: Irving Copas., MD;  Location: [facility];  Service: Gastroenterology;;   UPPER GI ENDOSCOPY     x 1   VAGINAL PROLAPSE REPAIR  11/18/2019   [facility]    SOCIAL HISTORY: Social History   Socioeconomic History   Marital status: Married    Spouse name: Not on file   Number of children: 3   Years of education: Not on file   Highest education level: Not on file  Occupational History   Occupation: retired  Tobacco Use   Smoking status: Former    Packs/day: 1.00    Years: 8.00    Pack years: 8.00    Types: Cigarettes    Quit date: 06/18/1964    Years since quitting: 57.0   Smokeless tobacco: Never  Vaping Use   Vaping Use: Never used  Substance and Sexual Activity   Alcohol use: No   Drug use: No   Sexual activity: Not Currently    Birth control/protection: Post-menopausal  Other Topics Concern   Not on file  Social History Narrative   Married.  Three children   Social Determinants of Health   Financial Resource Strain: Not on file  Food Insecurity: Not on file  Transportation Needs: Not on file  Physical  Activity: Not on file  Stress: Not on file  Social Connections: Not on file  Intimate Partner Violence: Not on file    FAMILY HISTORY: Family History  Problem Relation Age of Onset   Heart attack Father 68   Heart disease Brother 57       CABG   Colon cancer Neg Hx    Esophageal cancer Neg Hx    Inflammatory bowel disease Neg Hx    Liver disease Neg Hx    Pancreatic cancer Neg Hx    Rectal cancer Neg Hx    Stomach cancer Neg Hx      ROS 10 Point review of Systems was done is negative except as noted above.   PHYSICAL EXAMINATION .BP (!) 141/57    Pulse 67    Temp (!) 97 F (36.1 C)    Resp 18    Wt 126 lb 11.2 oz (57.5 kg)    LMP  (LMP Unknown)    SpO2 100%    BMI 25.59 kg/m  NAD GENERAL:alert, in no acute distress  and comfortable SKIN: no acute rashes, no significant lesions EYES: conjunctiva are pink and non-injected, sclera anicteric OROPHARYNX: MMM, no exudates, no oropharyngeal erythema or ulceration NECK: supple, no JVD LYMPH:  no palpable lymphadenopathy in the cervical, axillary or inguinal regions LUNGS: clear to auscultation b/l with normal respiratory effort HEART: regular rate & rhythm ABDOMEN:  normoactive bowel sounds , non tender, not distended. Extremity: no pedal edema PSYCH: alert & oriented x 3 with fluent speech NEURO: no focal motor/sensory deficits    LABORATORY DATA: . CBC Latest Ref Rng & Units 06/20/2021 06/06/2021 05/23/2021  WBC 4.0 - 10.5 K/uL 5.9 6.9 8.6  Hemoglobin 12.0 - 15.0 g/dL 9.3(L) 9.0(L) 9.7(L)  Hematocrit 36.0 - 46.0 % 31.2(L) 30.8(L) 31.4(L)  Platelets 150 - 400 K/uL 318 332 348   . CMP Latest Ref Rng & Units 06/20/2021 06/06/2021 05/23/2021  Glucose 70 - 99 mg/dL 102(H) 128(H) 108(H)  BUN 8 - 23 mg/dL 20 28(H) 30(H)  Creatinine 0.44 - 1.00 mg/dL 1.59(H) 1.72(H) 2.06(H)  Sodium 135 - 145 mmol/L 139 139 137  Potassium 3.5 - 5.1 mmol/L 3.8 4.1 3.9  Chloride 98 - 111 mmol/L 114(H) 117(H) 112(H)  CO2 22 - 32 mmol/L 19(L) 17(L) 18(L)  Calcium 8.9 - 10.3 mg/dL 8.0(L) 8.3(L) 9.8  Total Protein 6.5 - 8.1 g/dL 6.4(L) 6.2(L) 6.7  Total Bilirubin 0.3 - 1.2 mg/dL 0.4 0.3 0.5  Alkaline Phos 38 - 126 U/L 57 59 64  AST 15 - 41 U/L '15 18 21  '$ ALT 0 - 44 U/L '13 16 16    '$ . Lab Results  Component Value Date   LDH 117 12/22/2019     RADIOLOGY    CLINICAL DATA:  Subsequent treatment strategy for metastatic renal cell carcinoma.   EXAM: NUCLEAR MEDICINE PET SKULL BASE TO THIGH   TECHNIQUE: 6.32 mCi F-18 FDG was injected intravenously. Full-ring PET imaging was performed from the skull base to thigh after the radiotracer. CT data was obtained and used for attenuation correction and anatomic localization.   Fasting blood glucose: 159 mg/dl   COMPARISON:   PET-CT 08/29/2020 and 02/09/2020.   FINDINGS: Mediastinal blood pool activity: SUV max 2.7   NECK:   No hypermetabolic cervical lymph nodes are identified.There are no lesions of the pharyngeal mucosal space. Activity associated with the muscles of phonation is within physiologic limits and similar to previous study.   Incidental CT findings: Bilateral carotid  atherosclerosis.   CHEST:   There are no hypermetabolic mediastinal, hilar or axillary lymph nodes. No hypermetabolic pulmonary activity or suspicious nodularity.   Incidental CT findings: Right IJ Port-A-Cath extends to the superior cavoatrial junction. There is diffuse atherosclerosis of the aorta, great vessels and coronary arteries. Mild linear scarring at both lung bases appears unchanged.   ABDOMEN/PELVIS:   There is no hypermetabolic activity within the liver, adrenal glands, spleen or pancreas. There is no hypermetabolic nodal activity. Stable appearance of the right kidney. No hypermetabolic activity within the left nephrectomy bed   Incidental CT findings: Diffuse aortic and branch vessel atherosclerosis, scattered colonic diverticulosis and pelvic floor laxity are again noted.   SKELETON:   There is no hypermetabolic activity to suggest osseous metastatic disease.   Incidental CT findings: Grossly stable sclerotic lesions within the thoracic spine, without hypermetabolic activity. Lower lumbar spondylosis and sacroiliac degenerative changes bilaterally.   IMPRESSION: 1. Stable PET-CT status post left nephrectomy. No evidence of local recurrence or hypermetabolic metastatic disease. 2. Stable incidental findings including coronary and Aortic Atherosclerosis (ICD10-I70.0). Pelvic floor laxity.     Electronically Signed   By: Richardean Sale M.D.   On: 04/03/2021 12:30      ASSESSMENT and PLAN:   81 y.o. 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 PET/CT 06/19/2017 -- consistent with partial metabolic response to treatment.   Rt adrenal gland bx - showed clear cell RCC   S/p CYtoreductive left radical nephrectomy and left adrenal gland resection on 08/01/2016 by Dr Alinda Money.   10/16/17 PET/CT revealed Continued improvement, with the T7 metastatic lesion no longer significantly hypermetabolic. The previous right lower lobe pulmonary nodule is even less apparent, perhaps about 2 mm in diameter today; given that this measured 6 mm on 03/28/2017 this probably represents an effectively treated metastatic lesion. Currently no appreciable hypermetabolic activity is identified to suggest active malignancy. Distended gallbladder with gallbladder wall thickening and gallstones. Correlate clinically in assessing for cholecystitis. Small but abnormal amount of free pelvic fluid, nonspecific. Other imaging findings of potential clinical significance: Chronic ethmoid sinusitis. Aortic Atherosclerosis. Stable 5 mm right middle lobe pulmonary nodule, not hypermetabolic but below sensitive PET-CT size thresholds. Left nephrectomy. Notable pelvic floor laxity with cystocele. Chronic bilateral Sacroiliitis.   04/03/18 PET/CT revealed No evidence for new or progressive hypermetabolic disease on today's study to suggest metastatic progression. 2. Stable appearance of the T7 metastatic lesion without Hypermetabolism. 3. Tiny focus of FDG accumulation identified along the skin of the low right inguinal fold. No associated lesion evident on CT. This may be related to urinary contaminant. 4. Cholelithiasis with similar appearance of diffuse gallbladder wall thickening. 5. Stable 5 mm right middle lobe pulmonary nodule. 6.  Aortic Atherosclerois. 7. Diffuse colonic diverticulosis.    09/18/18 CT A/P revealed "Common duct stent in place. Improvement to resolution in biliary duct dilatation  compared to 08/29/2018. Interval cholecystectomy without acute complication. 2. Left nephrectomy, without evidence of metastatic disease. 3.  Tiny hiatal hernia. 4. Coronary artery atherosclerosis. Aortic Atherosclerosis. 5. Mild limitations secondary to lack of IV contrast. 6. Marked pelvic floor laxity with cystocele and rectal prolapse".   07/02/2019 PET/CT ([id number]) which revealed "1. 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   #3 Small pulmonary lesions --  10/16/17 PET/CT showed pulmonary less apparent than 03/28/17 PET/CT, decreasing from 6m to 245mdiameter.   MRI brain shows no evidence of metastatic disease   #4 T7/8 Bone metastases - received Xgeva every 4 weeks from May 2018 to June 2019. 10/16/17 PET/CT revealed improvement with T7 metastatic lesion no longer significantly hypermetabolic.  -on XgMarchelle Folks #5 ?liver mets- rpt PET/CT from 10/16/2017 shows no overt evidence of metastatic disease in the liver.   #6 Acute on chronic renal insufficiency Creatine stable 1.44   #7 Hemorrhoids -chronic with some bleeding --Recommended Sitz bath and OTC Anusol or Nupercaine for her hemorrhoid relief. -f/u with PCP for continued mx   #8 Hypothyroidism/Adrenal insufficiency/Diabetes -Continue being followed by Dr. KeBuddy Duty #9 h/o HTN - ?control. Patient tends to be anxious and has higher blood pressures in the clinic.   #10 CAD - positive cardiac nuclear stress test Following with cardiology and nephrology    #11 h/o Choledocholithiasis and cholelithiasis 07/10/18 USKoreabdomen revealed Biliary duct dilatation with 8 mm calculus at the level of the ampulla in the distal common bile duct. 2. Cholelithiasis. No gallbladder wall thickening or pericholecystic fluid. 3. Appearance of the liver raises concern for underlying hepatic cirrhosis. No focal liver lesions are demonstrable.  4.  Left kidney absent. 5.  Small cysts in right kidney. 6.  Aortic Atherosclerosis.   S/p ERCP on 07/23/18 with Dr. GaValarie Merinoansouraty   09/02/18 Cytopathology from ERCP was suspicious for malignant cells in bile duct, but was not definitive, other two findings were benign.   # 12 Peripheral arterial disease -- following with cardiology/vascular surgery PLAN:   -Patient's labs done today were reviewed with her in detail.  Renal function stable.   No new prohibitive toxicities from nivolumab -Continue nivolumab every 2 weeks until treatment-related toxicity, patient's preference or disease progression. -Orders for nivolumab reviewed and signed.  We will continue same supportive medications. This  Continue follow-up with PCP Please schedule next 6 doses of nivolumab with port flush and labs every 2 weeks MD visit with Dr. KaIrene Limbon 6 weeks   FOLLOW UP: Continue nivolumab with port flush and labs every 2 weeks x 8 doses Return to clinic with Dr. KaIrene Limbon 6 weeks with her scheduled treatment    .GaBrunetta GeneraD

## 2021-07-04 ENCOUNTER — Other Ambulatory Visit: Payer: Self-pay

## 2021-07-04 ENCOUNTER — Inpatient Hospital Stay: Payer: Medicare Other

## 2021-07-04 ENCOUNTER — Other Ambulatory Visit: Payer: Self-pay | Admitting: Hematology

## 2021-07-04 VITALS — BP 127/55 | HR 58 | Temp 97.5°F | Resp 17

## 2021-07-04 DIAGNOSIS — Z5112 Encounter for antineoplastic immunotherapy: Secondary | ICD-10-CM | POA: Diagnosis not present

## 2021-07-04 DIAGNOSIS — C642 Malignant neoplasm of left kidney, except renal pelvis: Secondary | ICD-10-CM

## 2021-07-04 DIAGNOSIS — C7951 Secondary malignant neoplasm of bone: Secondary | ICD-10-CM

## 2021-07-04 DIAGNOSIS — Z7189 Other specified counseling: Secondary | ICD-10-CM

## 2021-07-04 DIAGNOSIS — Z95828 Presence of other vascular implants and grafts: Secondary | ICD-10-CM

## 2021-07-04 LAB — CBC WITH DIFFERENTIAL/PLATELET
Abs Immature Granulocytes: 0.04 10*3/uL (ref 0.00–0.07)
Basophils Absolute: 0.1 10*3/uL (ref 0.0–0.1)
Basophils Relative: 1 %
Eosinophils Absolute: 0.1 10*3/uL (ref 0.0–0.5)
Eosinophils Relative: 1 %
HCT: 34.6 % — ABNORMAL LOW (ref 36.0–46.0)
Hemoglobin: 10 g/dL — ABNORMAL LOW (ref 12.0–15.0)
Immature Granulocytes: 0 %
Lymphocytes Relative: 10 %
Lymphs Abs: 1.1 10*3/uL (ref 0.7–4.0)
MCH: 23.1 pg — ABNORMAL LOW (ref 26.0–34.0)
MCHC: 28.9 g/dL — ABNORMAL LOW (ref 30.0–36.0)
MCV: 80.1 fL (ref 80.0–100.0)
Monocytes Absolute: 1.1 10*3/uL — ABNORMAL HIGH (ref 0.1–1.0)
Monocytes Relative: 10 %
Neutro Abs: 8.9 10*3/uL — ABNORMAL HIGH (ref 1.7–7.7)
Neutrophils Relative %: 78 %
Platelets: 375 10*3/uL (ref 150–400)
RBC: 4.32 MIL/uL (ref 3.87–5.11)
RDW: 18 % — ABNORMAL HIGH (ref 11.5–15.5)
WBC: 11.3 10*3/uL — ABNORMAL HIGH (ref 4.0–10.5)
nRBC: 0 % (ref 0.0–0.2)

## 2021-07-04 LAB — CMP (CANCER CENTER ONLY)
ALT: 28 U/L (ref 0–44)
AST: 32 U/L (ref 15–41)
Albumin: 3.8 g/dL (ref 3.5–5.0)
Alkaline Phosphatase: 63 U/L (ref 38–126)
Anion gap: 6 (ref 5–15)
BUN: 23 mg/dL (ref 8–23)
CO2: 17 mmol/L — ABNORMAL LOW (ref 22–32)
Calcium: 8.3 mg/dL — ABNORMAL LOW (ref 8.9–10.3)
Chloride: 114 mmol/L — ABNORMAL HIGH (ref 98–111)
Creatinine: 1.7 mg/dL — ABNORMAL HIGH (ref 0.44–1.00)
GFR, Estimated: 30 mL/min — ABNORMAL LOW (ref >60)
Glucose, Bld: 113 mg/dL — ABNORMAL HIGH (ref 70–99)
Potassium: 4.1 mmol/L (ref 3.5–5.1)
Sodium: 137 mmol/L (ref 135–145)
Total Bilirubin: 0.4 mg/dL (ref 0.3–1.2)
Total Protein: 6.8 g/dL (ref 6.5–8.1)

## 2021-07-04 MED ORDER — SODIUM CHLORIDE 0.9% FLUSH
10.0000 mL | INTRAVENOUS | Status: DC | PRN
  Administered 2021-07-04: 10 mL

## 2021-07-04 MED ORDER — SODIUM CHLORIDE 0.9 % IV SOLN: INTRAVENOUS | Status: AC

## 2021-07-04 MED ORDER — SODIUM CHLORIDE 0.9% FLUSH
10.0000 mL | Freq: Once | INTRAVENOUS | Status: AC
  Administered 2021-07-04: 10 mL

## 2021-07-04 MED ORDER — HEPARIN SOD (PORK) LOCK FLUSH 100 UNIT/ML IV SOLN
500.0000 [IU] | Freq: Once | INTRAVENOUS | Status: AC | PRN
  Administered 2021-07-04: 500 [IU]

## 2021-07-04 MED ORDER — SODIUM CHLORIDE 0.9 % IV SOLN
240.0000 mg | Freq: Once | INTRAVENOUS | Status: AC
  Administered 2021-07-04: 240 mg via INTRAVENOUS
  Filled 2021-07-04: qty 24

## 2021-07-04 NOTE — Progress Notes (Signed)
Alphonse Guild per Dr Irene Limbo ?

## 2021-07-04 NOTE — Patient Instructions (Signed)
[facility]  Discharge Instructions: ?Thank you for choosing [facility] to provide your oncology and hematology care.  ? ?If you have a lab appointment with the [facility], please go directly to the [facility] and check in at the registration area. ?  ?Wear comfortable clothing and clothing appropriate for easy access to any Portacath or PICC line.  ? ?We strive to give you quality time with your provider. You may need to reschedule your appointment if you arrive late (15 or more minutes).  Arriving late affects you and other patients whose appointments are after yours.  Also, if you miss three or more appointments without notifying the office, you may be dismissed from the clinic at the provider?s discretion.    ?  ?For prescription refill requests, have your pharmacy contact our office and allow 72 hours for refills to be completed.   ? ?Today you received the following chemotherapy and/or immunotherapy agents: Opdivo   ?  ?To help prevent nausea and vomiting after your treatment, we encourage you to take your nausea medication as directed. ? ?BELOW ARE SYMPTOMS THAT SHOULD BE REPORTED IMMEDIATELY: ?*FEVER GREATER THAN 100.4 F (38 ?C) OR HIGHER ?*CHILLS OR SWEATING ?*NAUSEA AND VOMITING THAT IS NOT CONTROLLED WITH YOUR NAUSEA MEDICATION ?*UNUSUAL SHORTNESS OF BREATH ?*UNUSUAL BRUISING OR BLEEDING ?*URINARY PROBLEMS (pain or burning when urinating, or frequent urination) ?*BOWEL PROBLEMS (unusual diarrhea, constipation, pain near the anus) ?TENDERNESS IN MOUTH AND THROAT WITH OR WITHOUT PRESENCE OF ULCERS (sore throat, sores in mouth, or a toothache) ?UNUSUAL RASH, SWELLING OR PAIN  ?UNUSUAL VAGINAL DISCHARGE OR ITCHING  ? ?Items with * indicate a potential emergency and should be followed up as soon as possible or go to the Emergency Department if any problems should occur. ? ?Please show the CHEMOTHERAPY ALERT CARD or IMMUNOTHERAPY ALERT CARD at check-in to the  Emergency Department and triage nurse. ? ?Should you have questions after your visit or need to cancel or reschedule your appointment, please contact [facility]  Dept: [phone]  and follow the prompts.  Office hours are 8:00 a.m. to 4:30 p.m. Monday - Friday. Please note that voicemails left after 4:00 p.m. may not be returned until the following business day.  We are closed weekends and major holidays. You have access to a nurse at all times for urgent questions. Please call the main number to the clinic Dept: [phone] and follow the prompts. ? ? ?For any non-urgent questions, you may also contact your provider using MyChart. We now offer e-Visits for anyone 51 and older to request care online for non-urgent symptoms. For details visit [email or web address]. ?  ?Also download the MyChart app! Go to the app store, search "MyChart", open the app, select [facility], and log in with your MyChart username and password. ? ?Due to Covid, a mask is required upon entering the hospital/clinic. If you do not have a mask, one will be given to you upon arrival. For doctor visits, patients may have 1 support person aged 39 or older with them. For treatment visits, patients cannot have anyone with them due to current Covid guidelines and our immunocompromised population.  ? ?Rehydration, Adult ?Rehydration is the replacement of body fluids, salts, and minerals (electrolytes) that are lost during dehydration. Dehydration is when there is not enough water or other fluids in the body. This happens when you lose more fluids than you take in. Common causes of dehydration include: ?Not drinking  enough fluids. This can occur when you are ill or doing activities that require a lot of energy, especially in hot weather. ?Conditions that cause loss of water or other fluids, such as diarrhea, vomiting, sweating, or urinating a lot. ?Other illnesses, such as fever or infection. ?Certain medicines,  such as those that remove excess fluid from the body (diuretics). ?Symptoms of mild or moderate dehydration may include thirst, dry lips and mouth, and dizziness. Symptoms of severe dehydration may include increased heart rate, confusion, fainting, and not urinating. ?For severe dehydration, you may need to get fluids through an IV at the hospital. For mild or moderate dehydration, you can usually rehydrate at home by drinking certain fluids as told by your health care provider. ?What are the risks? ?Generally, rehydration is safe. However, taking in too much fluid (overhydration) can be a problem. This is rare. Overhydration can cause an electrolyte imbalance, kidney failure, or a decrease in salt (sodium) levels in the body. ?Supplies needed ?You will need an oral rehydration solution (ORS) if your health care provider tells you to use one. This is a drink to treat dehydration. It can be found in pharmacies and retail stores. ?How to rehydrate ?Fluids ?Follow instructions from your health care provider for rehydration. The kind of fluid and the amount you should drink depend on your condition. In general, you should choose drinks that you prefer. ?If told by your health care provider, drink an ORS. ?Make an ORS by following instructions on the package. ?Start by drinking small amounts, about ? cup (120 mL) every 5-10 minutes. ?Slowly increase how much you drink until you have taken the amount recommended by your health care provider. ?Drink enough clear fluids to keep your urine pale yellow. If you were told to drink an ORS, finish it first, then start slowly drinking other clear fluids. Drink fluids such as: ?Water. This includes sparkling water and flavored water. Drinking only water can lead to having too little sodium in your body (hyponatremia). Follow the advice of your health care provider. ?Water from ice chips you suck on. ?Fruit juice with water you add to it (diluted). ?Sports drinks. ?Hot or cold  herbal teas. ?Broth-based soups. ?Milk or milk products. ?Food ?Follow instructions from your health care provider about what to eat while you rehydrate. Your health care provider may recommend that you slowly begin eating regular foods in small amounts. ?Eat foods that contain a healthy balance of electrolytes, such as bananas, oranges, potatoes, tomatoes, and spinach. ?Avoid foods that are greasy or contain a lot of sugar. ?In some cases, you may get nutrition through a feeding tube that is passed through your nose and into your stomach (nasogastric tube, or NG tube). This may be done if you have uncontrolled vomiting or diarrhea. ?Beverages to avoid ?Certain beverages may make dehydration worse. While you rehydrate, avoid drinking alcohol. ?How to tell if you are recovering from dehydration ?You may be recovering from dehydration if: ?You are urinating more often than before you started rehydrating. ?Your urine is pale yellow. ?Your energy level improves. ?You vomit less frequently. ?You have diarrhea less frequently. ?Your appetite improves or returns to normal. ?You feel less dizzy or less light-headed. ?Your skin tone and color start to look more normal. ?Follow these instructions at home: ?Take over-the-counter and prescription medicines only as told by your health care provider. ?Do not take sodium tablets. Doing this can lead to having too much sodium in your body (hypernatremia). ?Contact a health care  provider if: ?You continue to have symptoms of mild or moderate dehydration, such as: ?Thirst. ?Dry lips. ?Slightly dry mouth. ?Dizziness. ?Dark urine or less urine than normal. ?Muscle cramps. ?You continue to vomit or have diarrhea. ?Get help right away if you: ?Have symptoms of dehydration that get worse. ?Have a fever. ?Have a severe headache. ?Have been vomiting and the following happens: ?Your vomiting gets worse or does not go away. ?Your vomit includes blood or green matter (bile). ?You cannot eat or  drink without vomiting. ?Have problems with urination or bowel movements, such as: ?Diarrhea that gets worse or does not go away. ?Blood in your stool (feces). This may cause stool to look black and tarry. ?

## 2021-07-18 ENCOUNTER — Inpatient Hospital Stay: Payer: Medicare Other

## 2021-07-18 ENCOUNTER — Other Ambulatory Visit: Payer: Self-pay

## 2021-07-18 ENCOUNTER — Other Ambulatory Visit: Payer: Self-pay | Admitting: Hematology

## 2021-07-18 VITALS — BP 132/64 | HR 74 | Temp 98.0°F | Resp 16 | Wt 117.5 lb

## 2021-07-18 DIAGNOSIS — Z7189 Other specified counseling: Secondary | ICD-10-CM

## 2021-07-18 DIAGNOSIS — C642 Malignant neoplasm of left kidney, except renal pelvis: Secondary | ICD-10-CM

## 2021-07-18 DIAGNOSIS — C7951 Secondary malignant neoplasm of bone: Secondary | ICD-10-CM

## 2021-07-18 DIAGNOSIS — Z5112 Encounter for antineoplastic immunotherapy: Secondary | ICD-10-CM | POA: Diagnosis not present

## 2021-07-18 DIAGNOSIS — Z95828 Presence of other vascular implants and grafts: Secondary | ICD-10-CM

## 2021-07-18 LAB — CMP (CANCER CENTER ONLY)
ALT: 9 U/L (ref 0–44)
AST: 13 U/L — ABNORMAL LOW (ref 15–41)
Albumin: 3.8 g/dL (ref 3.5–5.0)
Alkaline Phosphatase: 54 U/L (ref 38–126)
Anion gap: 9 (ref 5–15)
BUN: 33 mg/dL — ABNORMAL HIGH (ref 8–23)
CO2: 16 mmol/L — ABNORMAL LOW (ref 22–32)
Calcium: 8.3 mg/dL — ABNORMAL LOW (ref 8.9–10.3)
Chloride: 109 mmol/L (ref 98–111)
Creatinine: 1.84 mg/dL — ABNORMAL HIGH (ref 0.44–1.00)
GFR, Estimated: 28 mL/min — ABNORMAL LOW (ref >60)
Glucose, Bld: 198 mg/dL — ABNORMAL HIGH (ref 70–99)
Potassium: 3.7 mmol/L (ref 3.5–5.1)
Sodium: 134 mmol/L — ABNORMAL LOW (ref 135–145)
Total Bilirubin: 0.4 mg/dL (ref 0.3–1.2)
Total Protein: 6.4 g/dL — ABNORMAL LOW (ref 6.5–8.1)

## 2021-07-18 LAB — CBC WITH DIFFERENTIAL/PLATELET
Abs Immature Granulocytes: 0.09 10*3/uL — ABNORMAL HIGH (ref 0.00–0.07)
Basophils Absolute: 0.1 10*3/uL (ref 0.0–0.1)
Basophils Relative: 1 %
Eosinophils Absolute: 0.1 10*3/uL (ref 0.0–0.5)
Eosinophils Relative: 1 %
HCT: 33.6 % — ABNORMAL LOW (ref 36.0–46.0)
Hemoglobin: 10.3 g/dL — ABNORMAL LOW (ref 12.0–15.0)
Immature Granulocytes: 1 %
Lymphocytes Relative: 10 %
Lymphs Abs: 1.2 10*3/uL (ref 0.7–4.0)
MCH: 23.6 pg — ABNORMAL LOW (ref 26.0–34.0)
MCHC: 30.7 g/dL (ref 30.0–36.0)
MCV: 77.1 fL — ABNORMAL LOW (ref 80.0–100.0)
Monocytes Absolute: 1 10*3/uL (ref 0.1–1.0)
Monocytes Relative: 8 %
Neutro Abs: 9.5 10*3/uL — ABNORMAL HIGH (ref 1.7–7.7)
Neutrophils Relative %: 79 %
Platelets: 391 10*3/uL (ref 150–400)
RBC: 4.36 MIL/uL (ref 3.87–5.11)
RDW: 18.6 % — ABNORMAL HIGH (ref 11.5–15.5)
WBC: 12.1 10*3/uL — ABNORMAL HIGH (ref 4.0–10.5)
nRBC: 0 % (ref 0.0–0.2)

## 2021-07-18 LAB — TSH: TSH: 0.565 u[IU]/mL (ref 0.308–3.960)

## 2021-07-18 MED ORDER — HEPARIN SOD (PORK) LOCK FLUSH 100 UNIT/ML IV SOLN
500.0000 [IU] | Freq: Once | INTRAVENOUS | Status: AC | PRN
  Administered 2021-07-18: 500 [IU]

## 2021-07-18 MED ORDER — SODIUM CHLORIDE 0.9 % IV SOLN: INTRAVENOUS | Status: AC

## 2021-07-18 MED ORDER — SODIUM CHLORIDE 0.9% FLUSH
10.0000 mL | Freq: Once | INTRAVENOUS | Status: AC
  Administered 2021-07-18: 10 mL

## 2021-07-18 MED ORDER — SODIUM CHLORIDE 0.9 % IV SOLN
240.0000 mg | Freq: Once | INTRAVENOUS | Status: AC
  Administered 2021-07-18: 240 mg via INTRAVENOUS
  Filled 2021-07-18: qty 24

## 2021-07-18 MED ORDER — DENOSUMAB 120 MG/1.7ML ~~LOC~~ SOLN
120.0000 mg | Freq: Once | SUBCUTANEOUS | Status: AC
  Administered 2021-07-18: 120 mg via SUBCUTANEOUS
  Filled 2021-07-18: qty 1.7

## 2021-07-18 MED ORDER — SODIUM CHLORIDE 0.9% FLUSH
10.0000 mL | INTRAVENOUS | Status: DC | PRN
  Administered 2021-07-18: 10 mL

## 2021-07-18 NOTE — Patient Instructions (Signed)
[facility]  Discharge Instructions: ?Thank you for choosing [facility] to provide your oncology and hematology care.  ? ?If you have a lab appointment with the [facility], please go directly to the [facility] and check in at the registration area. ?  ?Wear comfortable clothing and clothing appropriate for easy access to any Portacath or PICC line.  ? ?We strive to give you quality time with your provider. You may need to reschedule your appointment if you arrive late (15 or more minutes).  Arriving late affects you and other patients whose appointments are after yours.  Also, if you miss three or more appointments without notifying the office, you may be dismissed from the clinic at the provider?s discretion.    ?  ?For prescription refill requests, have your pharmacy contact our office and allow 72 hours for refills to be completed.   ? ?Today you received the following chemotherapy and/or immunotherapy agents: Opdivo    ?  ?To help prevent nausea and vomiting after your treatment, we encourage you to take your nausea medication as directed. ? ?BELOW ARE SYMPTOMS THAT SHOULD BE REPORTED IMMEDIATELY: ?*FEVER GREATER THAN 100.4 F (38 ?C) OR HIGHER ?*CHILLS OR SWEATING ?*NAUSEA AND VOMITING THAT IS NOT CONTROLLED WITH YOUR NAUSEA MEDICATION ?*UNUSUAL SHORTNESS OF BREATH ?*UNUSUAL BRUISING OR BLEEDING ?*URINARY PROBLEMS (pain or burning when urinating, or frequent urination) ?*BOWEL PROBLEMS (unusual diarrhea, constipation, pain near the anus) ?TENDERNESS IN MOUTH AND THROAT WITH OR WITHOUT PRESENCE OF ULCERS (sore throat, sores in mouth, or a toothache) ?UNUSUAL RASH, SWELLING OR PAIN  ?UNUSUAL VAGINAL DISCHARGE OR ITCHING  ? ?Items with * indicate a potential emergency and should be followed up as soon as possible or go to the Emergency Department if any problems should occur. ? ?Please show the CHEMOTHERAPY ALERT CARD or IMMUNOTHERAPY ALERT CARD at check-in to the  Emergency Department and triage nurse. ? ?Should you have questions after your visit or need to cancel or reschedule your appointment, please contact [facility]  Dept: [phone]  and follow the prompts.  Office hours are 8:00 a.m. to 4:30 p.m. Monday - Friday. Please note that voicemails left after 4:00 p.m. may not be returned until the following business day.  We are closed weekends and major holidays. You have access to a nurse at all times for urgent questions. Please call the main number to the clinic Dept: [phone] and follow the prompts. ? ? ?For any non-urgent questions, you may also contact your provider using MyChart. We now offer e-Visits for anyone 41 and older to request care online for non-urgent symptoms. For details visit [email or web address]. ?  ?Also download the MyChart app! Go to the app store, search "MyChart", open the app, select [facility], and log in with your MyChart username and password. ? ?Due to Covid, a mask is required upon entering the hospital/clinic. If you do not have a mask, one will be given to you upon arrival. For doctor visits, patients may have 1 support person aged 76 or older with them. For treatment visits, patients cannot have anyone with them due to current Covid guidelines and our immunocompromised population.  ? ?Rehydration, Adult ?Rehydration is the replacement of body fluids, salts, and minerals (electrolytes) that are lost during dehydration. Dehydration is when there is not enough water or other fluids in the body. This happens when you lose more fluids than you take in. Common causes of dehydration include: ?Not  drinking enough fluids. This can occur when you are ill or doing activities that require a lot of energy, especially in hot weather. ?Conditions that cause loss of water or other fluids, such as diarrhea, vomiting, sweating, or urinating a lot. ?Other illnesses, such as fever or infection. ?Certain medicines,  such as those that remove excess fluid from the body (diuretics). ?Symptoms of mild or moderate dehydration may include thirst, dry lips and mouth, and dizziness. Symptoms of severe dehydration may include increased heart rate, confusion, fainting, and not urinating. ?For severe dehydration, you may need to get fluids through an IV at the hospital. For mild or moderate dehydration, you can usually rehydrate at home by drinking certain fluids as told by your health care provider. ?What are the risks? ?Generally, rehydration is safe. However, taking in too much fluid (overhydration) can be a problem. This is rare. Overhydration can cause an electrolyte imbalance, kidney failure, or a decrease in salt (sodium) levels in the body. ?Supplies needed ?You will need an oral rehydration solution (ORS) if your health care provider tells you to use one. This is a drink to treat dehydration. It can be found in pharmacies and retail stores. ?How to rehydrate ?Fluids ?Follow instructions from your health care provider for rehydration. The kind of fluid and the amount you should drink depend on your condition. In general, you should choose drinks that you prefer. ?If told by your health care provider, drink an ORS. ?Make an ORS by following instructions on the package. ?Start by drinking small amounts, about ? cup (120 mL) every 5-10 minutes. ?Slowly increase how much you drink until you have taken the amount recommended by your health care provider. ?Drink enough clear fluids to keep your urine pale yellow. If you were told to drink an ORS, finish it first, then start slowly drinking other clear fluids. Drink fluids such as: ?Water. This includes sparkling water and flavored water. Drinking only water can lead to having too little sodium in your body (hyponatremia). Follow the advice of your health care provider. ?Water from ice chips you suck on. ?Fruit juice with water you add to it (diluted). ?Sports drinks. ?Hot or cold  herbal teas. ?Broth-based soups. ?Milk or milk products. ?Food ?Follow instructions from your health care provider about what to eat while you rehydrate. Your health care provider may recommend that you slowly begin eating regular foods in small amounts. ?Eat foods that contain a healthy balance of electrolytes, such as bananas, oranges, potatoes, tomatoes, and spinach. ?Avoid foods that are greasy or contain a lot of sugar. ?In some cases, you may get nutrition through a feeding tube that is passed through your nose and into your stomach (nasogastric tube, or NG tube). This may be done if you have uncontrolled vomiting or diarrhea. ?Beverages to avoid ?Certain beverages may make dehydration worse. While you rehydrate, avoid drinking alcohol. ?How to tell if you are recovering from dehydration ?You may be recovering from dehydration if: ?You are urinating more often than before you started rehydrating. ?Your urine is pale yellow. ?Your energy level improves. ?You vomit less frequently. ?You have diarrhea less frequently. ?Your appetite improves or returns to normal. ?You feel less dizzy or less light-headed. ?Your skin tone and color start to look more normal. ?Follow these instructions at home: ?Take over-the-counter and prescription medicines only as told by your health care provider. ?Do not take sodium tablets. Doing this can lead to having too much sodium in your body (hypernatremia). ?Contact a health  care provider if: ?You continue to have symptoms of mild or moderate dehydration, such as: ?Thirst. ?Dry lips. ?Slightly dry mouth. ?Dizziness. ?Dark urine or less urine than normal. ?Muscle cramps. ?You continue to vomit or have diarrhea. ?Get help right away if you: ?Have symptoms of dehydration that get worse. ?Have a fever. ?Have a severe headache. ?Have been vomiting and the following happens: ?Your vomiting gets worse or does not go away. ?Your vomit includes blood or green matter (bile). ?You cannot eat or  drink without vomiting. ?Have problems with urination or bowel movements, such as: ?Diarrhea that gets worse or does not go away. ?Blood in your stool (feces). This may cause stool to look black and tarry.

## 2021-07-25 ENCOUNTER — Inpatient Hospital Stay: Payer: Medicare Other | Admitting: Hematology

## 2021-07-27 ENCOUNTER — Other Ambulatory Visit: Payer: Self-pay | Admitting: Hematology

## 2021-08-01 ENCOUNTER — Other Ambulatory Visit: Payer: Self-pay

## 2021-08-01 ENCOUNTER — Inpatient Hospital Stay (HOSPITAL_BASED_OUTPATIENT_CLINIC_OR_DEPARTMENT_OTHER): Payer: Medicare Other | Admitting: Physician Assistant

## 2021-08-01 ENCOUNTER — Inpatient Hospital Stay: Payer: Medicare Other | Attending: Hematology

## 2021-08-01 ENCOUNTER — Inpatient Hospital Stay: Payer: Medicare Other

## 2021-08-01 VITALS — BP 115/52 | HR 97 | Temp 97.6°F | Resp 18 | Ht 59.0 in | Wt 122.1 lb

## 2021-08-01 DIAGNOSIS — I700 Atherosclerosis of aorta: Secondary | ICD-10-CM | POA: Insufficient documentation

## 2021-08-01 DIAGNOSIS — Z87891 Personal history of nicotine dependence: Secondary | ICD-10-CM | POA: Diagnosis not present

## 2021-08-01 DIAGNOSIS — C7951 Secondary malignant neoplasm of bone: Secondary | ICD-10-CM | POA: Diagnosis not present

## 2021-08-01 DIAGNOSIS — E039 Hypothyroidism, unspecified: Secondary | ICD-10-CM | POA: Diagnosis not present

## 2021-08-01 DIAGNOSIS — K5900 Constipation, unspecified: Secondary | ICD-10-CM | POA: Insufficient documentation

## 2021-08-01 DIAGNOSIS — C649 Malignant neoplasm of unspecified kidney, except renal pelvis: Secondary | ICD-10-CM | POA: Insufficient documentation

## 2021-08-01 DIAGNOSIS — K449 Diaphragmatic hernia without obstruction or gangrene: Secondary | ICD-10-CM | POA: Diagnosis not present

## 2021-08-01 DIAGNOSIS — C642 Malignant neoplasm of left kidney, except renal pelvis: Secondary | ICD-10-CM

## 2021-08-01 DIAGNOSIS — K649 Unspecified hemorrhoids: Secondary | ICD-10-CM | POA: Diagnosis not present

## 2021-08-01 DIAGNOSIS — I2510 Atherosclerotic heart disease of native coronary artery without angina pectoris: Secondary | ICD-10-CM | POA: Insufficient documentation

## 2021-08-01 DIAGNOSIS — Z7189 Other specified counseling: Secondary | ICD-10-CM

## 2021-08-01 DIAGNOSIS — I10 Essential (primary) hypertension: Secondary | ICD-10-CM | POA: Insufficient documentation

## 2021-08-01 DIAGNOSIS — Z5112 Encounter for antineoplastic immunotherapy: Secondary | ICD-10-CM | POA: Diagnosis present

## 2021-08-01 DIAGNOSIS — R748 Abnormal levels of other serum enzymes: Secondary | ICD-10-CM | POA: Insufficient documentation

## 2021-08-01 DIAGNOSIS — E785 Hyperlipidemia, unspecified: Secondary | ICD-10-CM | POA: Diagnosis not present

## 2021-08-01 DIAGNOSIS — R109 Unspecified abdominal pain: Secondary | ICD-10-CM | POA: Diagnosis not present

## 2021-08-01 DIAGNOSIS — I252 Old myocardial infarction: Secondary | ICD-10-CM | POA: Insufficient documentation

## 2021-08-01 DIAGNOSIS — Z95828 Presence of other vascular implants and grafts: Secondary | ICD-10-CM

## 2021-08-01 DIAGNOSIS — Z86010 Personal history of colonic polyps: Secondary | ICD-10-CM | POA: Insufficient documentation

## 2021-08-01 LAB — CBC WITH DIFFERENTIAL/PLATELET
Abs Immature Granulocytes: 0.09 10*3/uL — ABNORMAL HIGH (ref 0.00–0.07)
Basophils Absolute: 0.1 10*3/uL (ref 0.0–0.1)
Basophils Relative: 1 %
Eosinophils Absolute: 0.2 10*3/uL (ref 0.0–0.5)
Eosinophils Relative: 2 %
HCT: 32.5 % — ABNORMAL LOW (ref 36.0–46.0)
Hemoglobin: 10.1 g/dL — ABNORMAL LOW (ref 12.0–15.0)
Immature Granulocytes: 1 %
Lymphocytes Relative: 12 %
Lymphs Abs: 1.1 10*3/uL (ref 0.7–4.0)
MCH: 24.4 pg — ABNORMAL LOW (ref 26.0–34.0)
MCHC: 31.1 g/dL (ref 30.0–36.0)
MCV: 78.5 fL — ABNORMAL LOW (ref 80.0–100.0)
Monocytes Absolute: 1.1 10*3/uL — ABNORMAL HIGH (ref 0.1–1.0)
Monocytes Relative: 12 %
Neutro Abs: 6.8 10*3/uL (ref 1.7–7.7)
Neutrophils Relative %: 72 %
Platelets: 322 10*3/uL (ref 150–400)
RBC: 4.14 MIL/uL (ref 3.87–5.11)
RDW: 19.4 % — ABNORMAL HIGH (ref 11.5–15.5)
WBC: 9.3 10*3/uL (ref 4.0–10.5)
nRBC: 0 % (ref 0.0–0.2)

## 2021-08-01 LAB — CMP (CANCER CENTER ONLY)
ALT: 24 U/L (ref 0–44)
AST: 28 U/L (ref 15–41)
Albumin: 3.8 g/dL (ref 3.5–5.0)
Alkaline Phosphatase: 63 U/L (ref 38–126)
Anion gap: 7 (ref 5–15)
BUN: 37 mg/dL — ABNORMAL HIGH (ref 8–23)
CO2: 18 mmol/L — ABNORMAL LOW (ref 22–32)
Calcium: 9 mg/dL (ref 8.9–10.3)
Chloride: 109 mmol/L (ref 98–111)
Creatinine: 2 mg/dL — ABNORMAL HIGH (ref 0.44–1.00)
GFR, Estimated: 25 mL/min — ABNORMAL LOW (ref >60)
Glucose, Bld: 136 mg/dL — ABNORMAL HIGH (ref 70–99)
Potassium: 4.1 mmol/L (ref 3.5–5.1)
Sodium: 134 mmol/L — ABNORMAL LOW (ref 135–145)
Total Bilirubin: 0.3 mg/dL (ref 0.3–1.2)
Total Protein: 6.6 g/dL (ref 6.5–8.1)

## 2021-08-01 MED ORDER — SODIUM CHLORIDE 0.9 % IV SOLN: INTRAVENOUS | Status: AC

## 2021-08-01 MED ORDER — SODIUM CHLORIDE 0.9% FLUSH
10.0000 mL | Freq: Once | INTRAVENOUS | Status: AC
  Administered 2021-08-01: 10 mL

## 2021-08-01 MED ORDER — SODIUM CHLORIDE 0.9 % IV SOLN
240.0000 mg | Freq: Once | INTRAVENOUS | Status: AC
  Administered 2021-08-01: 240 mg via INTRAVENOUS
  Filled 2021-08-01: qty 24

## 2021-08-01 MED ORDER — SODIUM CHLORIDE 0.9% FLUSH
10.0000 mL | INTRAVENOUS | Status: DC | PRN
  Administered 2021-08-01: 10 mL

## 2021-08-01 MED ORDER — HEPARIN SOD (PORK) LOCK FLUSH 100 UNIT/ML IV SOLN
500.0000 [IU] | Freq: Once | INTRAVENOUS | Status: AC | PRN
  Administered 2021-08-01: 500 [IU]

## 2021-08-01 NOTE — Progress Notes (Signed)
? ?[facility] up: ?  ?DOS 08/01/2021 ? ?PCP ?[name], MD ?[address], [address] ?[place] [address] ? 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 ?DIAGNOSIS: metastatic renal cell carcinoma  ? 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  ?01/02/2018 -  Chemotherapy  ? Patient is on Treatment Plan : RENAL CELL CARCINOMA NIVOLUMAB Q14D  ?   ?Renal cell carcinoma ([place])  ?04/11/2017 Initial Diagnosis  ? Renal cell carcinoma ([place]) ?  ?01/02/2018 -  Chemotherapy  ? The patient had nivolumab (OPDIVO) 240 mg in sodium chloride 0.9 % 100 mL chemo infusion, 240 mg, Intravenous, Once, 47 of 50 cycles ?Administration: 240 mg (01/02/2018), 240 mg (01/15/2018), 240 mg (01/30/2018), 240 mg (02/13/2018), 240 mg (02/27/2018), 240 mg (03/13/2018), 240 mg (03/27/2018), 240 mg (04/10/2018), 240 mg (04/24/2018), 240 mg (05/08/2018), 240 mg (05/22/2018), 240 mg (06/05/2018), 240 mg (06/19/2018), 240 mg (08/28/2018), 240 mg (09/11/2018), 240 mg (10/09/2018), 240 mg (10/22/2018), 240 mg (11/06/2018), 240 mg (11/20/2018), 240 mg (12/04/2018), 240 mg (12/18/2018), 240 mg (01/01/2019), 240 mg (01/15/2019), 240 mg (01/29/2019), 240 mg (02/12/2019), 240 mg (02/26/2019), 240 mg (03/12/2019), 240 mg (03/26/2019), 240 mg (04/09/2019), 240 mg (04/28/2019), 240 mg (05/12/2019), 240 mg (05/27/2019), 240 mg (06/09/2019), 240 mg (06/23/2019), 240 mg (07/07/2019), 240 mg (07/21/2019), 240 mg (08/04/2019), 240 mg (08/18/2019), 240 mg (09/03/2019), 240 mg (09/15/2019), 240 mg (09/29/2019), 240 mg (10/12/2019), 240 mg (10/27/2019), 240 mg (11/10/2019), 240 mg (12/08/2019), 240 mg (12/22/2019), 240 mg (01/19/2020) ? ? for chemotherapy treatment.  ? ?  ? ? ?CURRENT THERAPY: Nivolumab q 14 days and Xgeva q 6 weeks ? ?INTERVAL HISTORY: ? ?Audrey [name] 81 y.o. female is here for continued evaluation and management of  his metastatic renal cell carcinoma.  She was last seen by Dr. Okema Rollinson Limbo on 06/20/2021. She is due for Nivolumab infusion today.  ? ?At today's visit, Ms. Schipani reports that her energy levels are fairly stable. Her appetite has improved with noted weight gain. She denies nausea,vomiting or abdominal pain. She has chronic constipation with a bowel movement every 2-3 days. She takes stool softeners and laxatives as needed. She reports hematochezia due to hemorrhoids. She reports occasional episodes of dizziness but denies any syncopal episodes. She is currently going to wound care for bilateral foot wounds. She denies fevers, chills, night sweats, shortness of breath, chest pain, cough, rash. She has no other complaints. ? ? ?Patient Active Problem List  ? Diagnosis Date Noted  ? Incisional hernia 06/23/2020  ? Gastritis and gastroduodenitis 02/26/2020  ? Ventral hernia without obstruction or gangrene 02/26/2020  ? Rectal discomfort 02/26/2020  ? History of colonic polyps 02/26/2020  ? History of cholecystectomy 07/17/2019  ? Anemia 10/10/2018  ? 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 Acute cholecystitis 08/29/2018  ? Constipation 08/23/2018  ? Abdominal pain 08/23/2018  ? 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 Preop cardiovascular exam 08/14/2018  ? Coronary artery disease involving native coronary artery of native heart without angina pectoris 08/14/2018  ? Dyslipidemia 08/14/2018  ? Abnormal LFTs 08/05/2018  ? Choledocholithiasis 08/05/2018  ? Dilated bile duct 08/05/2018  ? History of ERCP 08/05/2018  ? History of renal cell carcinoma 08/05/2018  ? Port-A-Cath in place 01/30/2018  ? Counseling regarding advance care planning and goals of care 01/04/2018  ? Renal cell carcinoma ([place]) 04/11/2017  ? Cancer, metastatic to  bone ([place]) 01/10/2017  ? Left renal mass 08/01/2016  ? ? ?is allergic to doxycycline, adhesive [tape], nsaids, and statins. ? ?MEDICAL HISTORY: ?Past Medical History:  ?Diagnosis Date  ? Anemia   ? Arthritis   ?  lower back, hips, hands  ? Biliary stricture   ? Diabetes mellitus ([place])   ? type 2   ? Early cataracts, bilateral   ? Md just watching  ? Elevated liver enzymes   ? Family history of adverse reaction to anesthesia   ? Daughter hard to wake up  ? Gallstones   ? GERD (gastroesophageal reflux disease)   ? occasional - diet controlled  ? History of blood transfusion 2018  ? History of hiatal hernia   ? HTN (hypertension)   ? Hyperlipidemia   ? Hypothyroidism   ? left renal ca dx'd 2018  ? renal cancer - left kidney removed, pill chemo x 1 yr  ? Myocardial infarction [facility]) 1991  ? no deficits  ? SVD (spontaneous vaginal delivery)   ? x 3  ? Wears glasses   ? ? ?SURGICAL HISTORY: ?Past Surgical History:  ?Procedure Laterality Date  ? BALLOON DILATION N/A 07/23/2018  ? Procedure: BALLOON DILATION;  Surgeon: Irving Copas., MD;  Location: [place];  Service: Gastroenterology;  Laterality: N/A;  ? BILIARY BRUSHING  08/10/2018  ? Procedure: BILIARY BRUSHING;  Surgeon: Rush Landmark Telford Nab., MD;  Location: [place];  Service: Gastroenterology;;  ? BILIARY BRUSHING  11/30/2018  ? Procedure: BILIARY BRUSHING;  Surgeon: Rush Landmark Telford Nab., MD;  Location: [place];  Service: Gastroenterology;;  ? BILIARY DILATION  08/10/2018  ? Procedure: BILIARY DILATION;  Surgeon: Rush Landmark Telford Nab., MD;  Location: [place];  Service: Gastroenterology;;  ? BILIARY DILATION  08/27/2018  ? Procedure: BILIARY DILATION;  Surgeon: Rush Landmark Telford Nab., MD;  Location: [place];  Service: Gastroenterology;;  ? BILIARY DILATION  01/24/2020  ? Procedure: BILIARY DILATION;  Surgeon: Rush Landmark Telford Nab., MD;  Location: [place];  Service: Gastroenterology;;  ? BILIARY STENT PLACEMENT  08/10/2018  ? Procedure: BILIARY STENT PLACEMENT;  Surgeon: Rush Landmark Telford Nab., MD;  Location: [place];  Service: Gastroenterology;;  ? BILIARY STENT PLACEMENT  08/27/2018  ? Procedure: BILIARY STENT PLACEMENT;  Surgeon:  Rush Landmark Telford Nab., MD;  Location: [place];  Service: Gastroenterology;;  ? BILIARY STENT PLACEMENT  11/30/2018  ? Procedure: BILIARY STENT PLACEMENT;  Surgeon: Irving Copas., MD;  Location: [place];  Service: Gastroenterology;;  ? BIOPSY  07/23/2018  ? Procedure: BIOPSY;  Surgeon: Irving Copas., MD;  Location: [place];  Service: Gastroenterology;;  ? BIOPSY  01/24/2020  ? Procedure: BIOPSY;  Surgeon: Irving Copas., MD;  Location: [place];  Service: Gastroenterology;;  ? CHOLECYSTECTOMY N/A 09/02/2018  ? Procedure: LAPAROSCOPIC CHOLECYSTECTOMY;  Surgeon: Stark Klein, MD;  Location: [place];  Service: General;  Laterality: N/A;  ? COLONOSCOPY    ? normal   ? ENDOSCOPIC RETROGRADE CHOLANGIOPANCREATOGRAPHY (ERCP) WITH PROPOFOL N/A 08/10/2018  ? Procedure: ENDOSCOPIC RETROGRADE CHOLANGIOPANCREATOGRAPHY (ERCP) WITH PROPOFOL;  Surgeon: Rush Landmark Telford Nab., MD;  Location: [place];  Service: Gastroenterology;  Laterality: N/A;  ? ENDOSCOPIC RETROGRADE CHOLANGIOPANCREATOGRAPHY (ERCP) WITH PROPOFOL N/A 11/30/2018  ? Procedure: ENDOSCOPIC RETROGRADE CHOLANGIOPANCREATOGRAPHY (ERCP) WITH PROPOFOL;  Surgeon: Rush Landmark Telford Nab., MD;  Location: [place];  Service: Gastroenterology;  Laterality: N/A;  ? ENDOSCOPIC RETROGRADE CHOLANGIOPANCREATOGRAPHY (ERCP) WITH PROPOFOL N/A 01/24/2020  ? Procedure: ENDOSCOPIC RETROGRADE CHOLANGIOPANCREATOGRAPHY (ERCP) WITH PROPOFOL;  Surgeon: Rush Landmark Telford Nab., MD;  Location: [place];  Service: Gastroenterology;  Laterality: N/A;  ? ERCP N/A 07/23/2018  ? Procedure: ENDOSCOPIC RETROGRADE CHOLANGIOPANCREATOGRAPHY (ERCP);  Surgeon: Irving Copas., MD;  Location: [place];  Service: Gastroenterology;  Laterality: N/A;  ? ERCP N/A 08/27/2018  ? Procedure: ENDOSCOPIC RETROGRADE CHOLANGIOPANCREATOGRAPHY (ERCP);  Surgeon: Irving Copas., MD;  Location: [place];  Service: Gastroenterology;  Laterality: N/A;  ?  ESOPHAGOGASTRODUODENOSCOPY N/A 01/24/2020  ? Procedure: ESOPHAGOGASTRODUODENOSCOPY (EGD);  Surgeon: Irving Copas., MD;  Location: [place];  Service: Gastroenterology;  Laterality: N/A;  ? [place]

## 2021-08-01 NOTE — Patient Instructions (Signed)
[facility] MEDICAL ONCOLOGY  Discharge Instructions: ?Thank you for choosing [facility] to provide your oncology and hematology care.  ? ?If you have a lab appointment with the Cancer Center, please go directly to the Cancer Center and check in at the registration area. ?  ?Wear comfortable clothing and clothing appropriate for easy access to any Portacath or PICC line.  ? ?We strive to give you quality time with your provider. You may need to reschedule your appointment if you arrive late (15 or more minutes).  Arriving late affects you and other patients whose appointments are after yours.  Also, if you miss three or more appointments without notifying the office, you may be dismissed from the clinic at the provider?s discretion.    ?  ?For prescription refill requests, have your pharmacy contact our office and allow 72 hours for refills to be completed.   ? ?Today you received the following chemotherapy and/or immunotherapy agents Opdivo    ?  ?To help prevent nausea and vomiting after your treatment, we encourage you to take your nausea medication as directed. ? ?BELOW ARE SYMPTOMS THAT SHOULD BE REPORTED IMMEDIATELY: ?*FEVER GREATER THAN 100.4 F (38 ?C) OR HIGHER ?*CHILLS OR SWEATING ?*NAUSEA AND VOMITING THAT IS NOT CONTROLLED WITH YOUR NAUSEA MEDICATION ?*UNUSUAL SHORTNESS OF BREATH ?*UNUSUAL BRUISING OR BLEEDING ?*URINARY PROBLEMS (pain or burning when urinating, or frequent urination) ?*BOWEL PROBLEMS (unusual diarrhea, constipation, pain near the anus) ?TENDERNESS IN MOUTH AND THROAT WITH OR WITHOUT PRESENCE OF ULCERS (sore throat, sores in mouth, or a toothache) ?UNUSUAL RASH, SWELLING OR PAIN  ?UNUSUAL VAGINAL DISCHARGE OR ITCHING  ? ?Items with * indicate a potential emergency and should be followed up as soon as possible or go to the Emergency Department if any problems should occur. ? ?Please show the CHEMOTHERAPY ALERT CARD or IMMUNOTHERAPY ALERT CARD at check-in to the  Emergency Department and triage nurse. ? ?Should you have questions after your visit or need to cancel or reschedule your appointment, please contact [facility]ICAL ONCOL[facility]  Dept: [phone]  and follow the prompts.  Office hours are 8:00 a.m. to 4:30 p.m. Monday - Friday. Please note that voicemails left after 4:00 p.m. may not be returned until the following business day.  We are closed weekends and major holidays. You have access to a nurse at all times for urgent questions. Please call the main number to the clinic Dept: [phone] and follow the prompts. ? ? ?For any non-urgent questions, you may also contact your provider using MyChart. We now offer e-Visits for anyone 18 and older to request care online for non-urgent symptoms. For details visit [email or web address]. ?  ?Also download the MyChart app! Go to the app store, search "MyChart", open the app, select [place], and log in with your MyChart username and password. ? ?Due to Covid, a mask is required upon entering the hospital/clinic. If you do not have a mask, one will be given to you upon arrival. For doctor visits, patients may have 1 support person aged 18 or older with them. For treatment visits, patients cannot have anyone with them due to current Covid guidelines and our immunocompromised population.  ? ?

## 2021-08-09 ENCOUNTER — Telehealth: Payer: Self-pay | Admitting: Hematology

## 2021-08-09 NOTE — Telephone Encounter (Signed)
Rescheduled upcoming appointment due to infusion availability. Patient is aware of changes. ?

## 2021-08-15 ENCOUNTER — Other Ambulatory Visit: Payer: Self-pay

## 2021-08-15 ENCOUNTER — Other Ambulatory Visit: Payer: Self-pay | Admitting: *Deleted

## 2021-08-15 ENCOUNTER — Inpatient Hospital Stay: Payer: Medicare Other

## 2021-08-15 ENCOUNTER — Other Ambulatory Visit: Payer: Self-pay | Admitting: Hematology

## 2021-08-15 VITALS — BP 123/53 | HR 62 | Temp 97.7°F | Resp 18 | Ht 59.0 in | Wt 123.2 lb

## 2021-08-15 DIAGNOSIS — C642 Malignant neoplasm of left kidney, except renal pelvis: Secondary | ICD-10-CM

## 2021-08-15 DIAGNOSIS — Z7189 Other specified counseling: Secondary | ICD-10-CM

## 2021-08-15 DIAGNOSIS — K811 Chronic cholecystitis: Secondary | ICD-10-CM

## 2021-08-15 DIAGNOSIS — K8050 Calculus of bile duct without cholangitis or cholecystitis without obstruction: Secondary | ICD-10-CM

## 2021-08-15 DIAGNOSIS — R1013 Epigastric pain: Secondary | ICD-10-CM

## 2021-08-15 DIAGNOSIS — K2970 Gastritis, unspecified, without bleeding: Secondary | ICD-10-CM

## 2021-08-15 DIAGNOSIS — E1122 Type 2 diabetes mellitus with diabetic chronic kidney disease: Secondary | ICD-10-CM

## 2021-08-15 DIAGNOSIS — C7951 Secondary malignant neoplasm of bone: Secondary | ICD-10-CM

## 2021-08-15 DIAGNOSIS — C649 Malignant neoplasm of unspecified kidney, except renal pelvis: Secondary | ICD-10-CM

## 2021-08-15 DIAGNOSIS — E119 Type 2 diabetes mellitus without complications: Secondary | ICD-10-CM | POA: Insufficient documentation

## 2021-08-15 DIAGNOSIS — K810 Acute cholecystitis: Secondary | ICD-10-CM

## 2021-08-15 DIAGNOSIS — R7989 Other specified abnormal findings of blood chemistry: Secondary | ICD-10-CM

## 2021-08-15 DIAGNOSIS — K6289 Other specified diseases of anus and rectum: Secondary | ICD-10-CM

## 2021-08-15 DIAGNOSIS — K831 Obstruction of bile duct: Secondary | ICD-10-CM

## 2021-08-15 DIAGNOSIS — Z5112 Encounter for antineoplastic immunotherapy: Secondary | ICD-10-CM | POA: Diagnosis not present

## 2021-08-15 DIAGNOSIS — R300 Dysuria: Secondary | ICD-10-CM

## 2021-08-15 DIAGNOSIS — Z9049 Acquired absence of other specified parts of digestive tract: Secondary | ICD-10-CM

## 2021-08-15 DIAGNOSIS — Z95828 Presence of other vascular implants and grafts: Secondary | ICD-10-CM

## 2021-08-15 DIAGNOSIS — K838 Other specified diseases of biliary tract: Secondary | ICD-10-CM

## 2021-08-15 DIAGNOSIS — I2510 Atherosclerotic heart disease of native coronary artery without angina pectoris: Secondary | ICD-10-CM

## 2021-08-15 LAB — CBC WITH DIFFERENTIAL/PLATELET
Abs Immature Granulocytes: 0.07 10*3/uL (ref 0.00–0.07)
Basophils Absolute: 0.1 10*3/uL (ref 0.0–0.1)
Basophils Relative: 1 %
Eosinophils Absolute: 0.2 10*3/uL (ref 0.0–0.5)
Eosinophils Relative: 1 %
HCT: 33.3 % — ABNORMAL LOW (ref 36.0–46.0)
Hemoglobin: 9.8 g/dL — ABNORMAL LOW (ref 12.0–15.0)
Immature Granulocytes: 1 %
Lymphocytes Relative: 9 %
Lymphs Abs: 1.1 10*3/uL (ref 0.7–4.0)
MCH: 23.4 pg — ABNORMAL LOW (ref 26.0–34.0)
MCHC: 29.4 g/dL — ABNORMAL LOW (ref 30.0–36.0)
MCV: 79.5 fL — ABNORMAL LOW (ref 80.0–100.0)
Monocytes Absolute: 1.2 10*3/uL — ABNORMAL HIGH (ref 0.1–1.0)
Monocytes Relative: 10 %
Neutro Abs: 9.6 10*3/uL — ABNORMAL HIGH (ref 1.7–7.7)
Neutrophils Relative %: 78 %
Platelets: 364 10*3/uL (ref 150–400)
RBC: 4.19 MIL/uL (ref 3.87–5.11)
RDW: 18.6 % — ABNORMAL HIGH (ref 11.5–15.5)
WBC: 12.1 10*3/uL — ABNORMAL HIGH (ref 4.0–10.5)
nRBC: 0 % (ref 0.0–0.2)

## 2021-08-15 LAB — URINALYSIS, COMPLETE (UACMP) WITH MICROSCOPIC
Bilirubin Urine: NEGATIVE
Glucose, UA: >=500 mg/dL — AB
Hgb urine dipstick: NEGATIVE
Ketones, ur: NEGATIVE mg/dL
Nitrite: NEGATIVE
Protein, ur: 30 mg/dL — AB
Specific Gravity, Urine: 1.013 (ref 1.005–1.030)
pH: 6 (ref 5.0–8.0)

## 2021-08-15 LAB — CMP (CANCER CENTER ONLY)
ALT: 26 U/L (ref 0–44)
AST: 24 U/L (ref 15–41)
Albumin: 3.7 g/dL (ref 3.5–5.0)
Alkaline Phosphatase: 59 U/L (ref 38–126)
Anion gap: 7 (ref 5–15)
BUN: 32 mg/dL — ABNORMAL HIGH (ref 8–23)
CO2: 18 mmol/L — ABNORMAL LOW (ref 22–32)
Calcium: 8.4 mg/dL — ABNORMAL LOW (ref 8.9–10.3)
Chloride: 110 mmol/L (ref 98–111)
Creatinine: 1.48 mg/dL — ABNORMAL HIGH (ref 0.44–1.00)
GFR, Estimated: 36 mL/min — ABNORMAL LOW (ref >60)
Glucose, Bld: 178 mg/dL — ABNORMAL HIGH (ref 70–99)
Potassium: 3.7 mmol/L (ref 3.5–5.1)
Sodium: 135 mmol/L (ref 135–145)
Total Bilirubin: 0.4 mg/dL (ref 0.3–1.2)
Total Protein: 6.2 g/dL — ABNORMAL LOW (ref 6.5–8.1)

## 2021-08-15 MED ORDER — HEPARIN SOD (PORK) LOCK FLUSH 100 UNIT/ML IV SOLN
500.0000 [IU] | Freq: Once | INTRAVENOUS | Status: AC | PRN
  Administered 2021-08-15: 500 [IU]

## 2021-08-15 MED ORDER — SODIUM CHLORIDE 0.9% FLUSH
10.0000 mL | Freq: Once | INTRAVENOUS | Status: AC
  Administered 2021-08-15: 10 mL

## 2021-08-15 MED ORDER — SODIUM CHLORIDE 0.9 % IV SOLN: INTRAVENOUS | Status: AC

## 2021-08-15 MED ORDER — SODIUM CHLORIDE 0.9 % IV SOLN
240.0000 mg | Freq: Once | INTRAVENOUS | Status: AC
  Administered 2021-08-15: 240 mg via INTRAVENOUS
  Filled 2021-08-15: qty 24

## 2021-08-15 MED ORDER — SODIUM CHLORIDE 0.9% FLUSH
10.0000 mL | INTRAVENOUS | Status: DC | PRN
  Administered 2021-08-15: 10 mL

## 2021-08-15 NOTE — Patient Instructions (Signed)
[facility] MEDICAL ONCOLOGY   ?Discharge Instructions: ?Thank you for choosing [facility] to provide your oncology and hematology care.  ? ?If you have a lab appointment with the Cancer Center, please go directly to the Cancer Center and check in at the registration area. ?  ?Wear comfortable clothing and clothing appropriate for easy access to any Portacath or PICC line.  ? ?We strive to give you quality time with your provider. You may need to reschedule your appointment if you arrive late (15 or more minutes).  Arriving late affects you and other patients whose appointments are after yours.  Also, if you miss three or more appointments without notifying the office, you may be dismissed from the clinic at the provider?s discretion.    ?  ?For prescription refill requests, have your pharmacy contact our office and allow 72 hours for refills to be completed.   ? ?Today you received the following chemotherapy and/or immunotherapy agents: Nivolumab (Opdivo)    ?  ?To help prevent nausea and vomiting after your treatment, we encourage you to take your nausea medication as directed. ? ?BELOW ARE SYMPTOMS THAT SHOULD BE REPORTED IMMEDIATELY: ?*FEVER GREATER THAN 100.4 F (38 ?C) OR HIGHER ?*CHILLS OR SWEATING ?*NAUSEA AND VOMITING THAT IS NOT CONTROLLED WITH YOUR NAUSEA MEDICATION ?*UNUSUAL SHORTNESS OF BREATH ?*UNUSUAL BRUISING OR BLEEDING ?*URINARY PROBLEMS (pain or burning when urinating, or frequent urination) ?*BOWEL PROBLEMS (unusual diarrhea, constipation, pain near the anus) ?TENDERNESS IN MOUTH AND THROAT WITH OR WITHOUT PRESENCE OF ULCERS (sore throat, sores in mouth, or a toothache) ?UNUSUAL RASH, SWELLING OR PAIN  ?UNUSUAL VAGINAL DISCHARGE OR ITCHING  ? ?Items with * indicate a potential emergency and should be followed up as soon as possible or go to the Emergency Department if any problems should occur. ? ?Please show the CHEMOTHERAPY ALERT CARD or IMMUNOTHERAPY ALERT CARD at  check-in to the Emergency Department and triage nurse. ? ?Should you have questions after your visit or need to cancel or reschedule your appointment, please contact [facility] ONCOL[facility]  Dept: [phone]  and follow the prompts.  Office hours are 8:00 a.m. to 4:30 p.m. Monday - Friday. Please note that voicemails left after 4:00 p.m. may not be returned until the following business day.  We are closed weekends and major holidays. You have access to a nurse at all times for urgent questions. Please call the main number to the clinic Dept: [phone] and follow the prompts. ? ? ?For any non-urgent questions, you may also contact your provider using MyChart. We now offer e-Visits for anyone 18 and older to request care online for non-urgent symptoms. For details visit [email or web address]. ?  ?Also download the MyChart app! Go to the app store, search "MyChart", open the app, select [place], and log in with your MyChart username and password. ? ?Due to Covid, a mask is required upon entering the hospital/clinic. If you do not have a mask, one will be given to you upon arrival. For doctor visits, patients may have 1 support person aged 18 or older with them. For treatment visits, patients cannot have anyone with them due to current Covid guidelines and our immunocompromised population.  ? ?

## 2021-08-17 ENCOUNTER — Other Ambulatory Visit: Payer: Self-pay | Admitting: Hematology

## 2021-08-17 LAB — URINE CULTURE: Culture: 50000 — AB

## 2021-08-17 MED ORDER — SULFAMETHOXAZOLE-TRIMETHOPRIM 800-160 MG PO TABS: 1.0000 | ORAL_TABLET | Freq: Every day | ORAL | 0 refills | Status: DC

## 2021-08-17 NOTE — Progress Notes (Signed)
?  I called Audrey [name] to discuss her urinalysis and urine culture results.  She has a small decrease trace positive and slight increase in urinary WBCs.  Urine culture grows 50,000 CFU's per mL of Staph epidermidis. ?We discussed that Staph epidermidis is often a contaminant but can sometimes cause infections. ?Urine culture does not completely meet criteria for UTI at this time however she is having UTI symptoms and is keen to have antibiotic available if symptoms get worse. ?Pros and cons were discussed with her.  Sent prescription for Bactrim renally dosed tablet p.o. daily for 3 days for uncomplicated UTI. 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 ?

## 2021-08-29 ENCOUNTER — Inpatient Hospital Stay: Payer: Medicare Other | Attending: Hematology

## 2021-08-29 ENCOUNTER — Other Ambulatory Visit: Payer: Medicare Other

## 2021-08-29 ENCOUNTER — Inpatient Hospital Stay (HOSPITAL_BASED_OUTPATIENT_CLINIC_OR_DEPARTMENT_OTHER): Payer: Medicare Other | Admitting: Hematology

## 2021-08-29 ENCOUNTER — Ambulatory Visit: Payer: Medicare Other

## 2021-08-29 ENCOUNTER — Encounter: Payer: Medicare Other | Admitting: Dietician

## 2021-08-29 ENCOUNTER — Other Ambulatory Visit: Payer: Self-pay

## 2021-08-29 ENCOUNTER — Inpatient Hospital Stay: Payer: Medicare Other | Admitting: Dietician

## 2021-08-29 ENCOUNTER — Other Ambulatory Visit: Payer: Self-pay | Admitting: Lab

## 2021-08-29 ENCOUNTER — Inpatient Hospital Stay: Payer: Medicare Other

## 2021-08-29 VITALS — BP 121/55 | HR 74 | Temp 97.7°F | Resp 18 | Wt 120.1 lb

## 2021-08-29 DIAGNOSIS — I2510 Atherosclerotic heart disease of native coronary artery without angina pectoris: Secondary | ICD-10-CM | POA: Diagnosis not present

## 2021-08-29 DIAGNOSIS — C787 Secondary malignant neoplasm of liver and intrahepatic bile duct: Secondary | ICD-10-CM | POA: Diagnosis not present

## 2021-08-29 DIAGNOSIS — C7951 Secondary malignant neoplasm of bone: Secondary | ICD-10-CM

## 2021-08-29 DIAGNOSIS — Z7189 Other specified counseling: Secondary | ICD-10-CM

## 2021-08-29 DIAGNOSIS — K449 Diaphragmatic hernia without obstruction or gangrene: Secondary | ICD-10-CM | POA: Insufficient documentation

## 2021-08-29 DIAGNOSIS — E785 Hyperlipidemia, unspecified: Secondary | ICD-10-CM | POA: Diagnosis not present

## 2021-08-29 DIAGNOSIS — C642 Malignant neoplasm of left kidney, except renal pelvis: Secondary | ICD-10-CM

## 2021-08-29 DIAGNOSIS — Z95828 Presence of other vascular implants and grafts: Secondary | ICD-10-CM

## 2021-08-29 DIAGNOSIS — I700 Atherosclerosis of aorta: Secondary | ICD-10-CM | POA: Insufficient documentation

## 2021-08-29 DIAGNOSIS — K5900 Constipation, unspecified: Secondary | ICD-10-CM | POA: Insufficient documentation

## 2021-08-29 DIAGNOSIS — C649 Malignant neoplasm of unspecified kidney, except renal pelvis: Secondary | ICD-10-CM | POA: Insufficient documentation

## 2021-08-29 DIAGNOSIS — Z5112 Encounter for antineoplastic immunotherapy: Secondary | ICD-10-CM | POA: Insufficient documentation

## 2021-08-29 DIAGNOSIS — E039 Hypothyroidism, unspecified: Secondary | ICD-10-CM | POA: Insufficient documentation

## 2021-08-29 DIAGNOSIS — E1142 Type 2 diabetes mellitus with diabetic polyneuropathy: Secondary | ICD-10-CM | POA: Insufficient documentation

## 2021-08-29 DIAGNOSIS — I10 Essential (primary) hypertension: Secondary | ICD-10-CM | POA: Diagnosis not present

## 2021-08-29 DIAGNOSIS — Z87891 Personal history of nicotine dependence: Secondary | ICD-10-CM | POA: Diagnosis not present

## 2021-08-29 DIAGNOSIS — Z86010 Personal history of colonic polyps: Secondary | ICD-10-CM | POA: Diagnosis not present

## 2021-08-29 LAB — CBC WITH DIFFERENTIAL (CANCER CENTER ONLY)
Abs Immature Granulocytes: 0.08 10*3/uL — ABNORMAL HIGH (ref 0.00–0.07)
Basophils Absolute: 0.1 10*3/uL (ref 0.0–0.1)
Basophils Relative: 1 %
Eosinophils Absolute: 0.1 10*3/uL (ref 0.0–0.5)
Eosinophils Relative: 1 %
HCT: 31.9 % — ABNORMAL LOW (ref 36.0–46.0)
Hemoglobin: 10 g/dL — ABNORMAL LOW (ref 12.0–15.0)
Immature Granulocytes: 1 %
Lymphocytes Relative: 10 %
Lymphs Abs: 1 10*3/uL (ref 0.7–4.0)
MCH: 24.9 pg — ABNORMAL LOW (ref 26.0–34.0)
MCHC: 31.3 g/dL (ref 30.0–36.0)
MCV: 79.4 fL — ABNORMAL LOW (ref 80.0–100.0)
Monocytes Absolute: 0.9 10*3/uL (ref 0.1–1.0)
Monocytes Relative: 8 %
Neutro Abs: 8.1 10*3/uL — ABNORMAL HIGH (ref 1.7–7.7)
Neutrophils Relative %: 79 %
Platelet Count: 358 10*3/uL (ref 150–400)
RBC: 4.02 MIL/uL (ref 3.87–5.11)
RDW: 18.9 % — ABNORMAL HIGH (ref 11.5–15.5)
WBC Count: 10.2 10*3/uL (ref 4.0–10.5)
nRBC: 0 % (ref 0.0–0.2)

## 2021-08-29 LAB — CMP (CANCER CENTER ONLY)
ALT: 16 U/L (ref 0–44)
AST: 17 U/L (ref 15–41)
Albumin: 3.7 g/dL (ref 3.5–5.0)
Alkaline Phosphatase: 58 U/L (ref 38–126)
Anion gap: 6 (ref 5–15)
BUN: 37 mg/dL — ABNORMAL HIGH (ref 8–23)
CO2: 18 mmol/L — ABNORMAL LOW (ref 22–32)
Calcium: 8.6 mg/dL — ABNORMAL LOW (ref 8.9–10.3)
Chloride: 116 mmol/L — ABNORMAL HIGH (ref 98–111)
Creatinine: 1.75 mg/dL — ABNORMAL HIGH (ref 0.44–1.00)
GFR, Estimated: 29 mL/min — ABNORMAL LOW (ref >60)
Glucose, Bld: 125 mg/dL — ABNORMAL HIGH (ref 70–99)
Potassium: 4 mmol/L (ref 3.5–5.1)
Sodium: 140 mmol/L (ref 135–145)
Total Bilirubin: 0.3 mg/dL (ref 0.3–1.2)
Total Protein: 6.4 g/dL — ABNORMAL LOW (ref 6.5–8.1)

## 2021-08-29 LAB — IRON AND IRON BINDING CAPACITY (CC-WL,HP ONLY)
Iron: 18 ug/dL — ABNORMAL LOW (ref 28–170)
Saturation Ratios: 4 % — ABNORMAL LOW (ref 10.4–31.8)
TIBC: 410 ug/dL (ref 250–450)
UIBC: 392 ug/dL

## 2021-08-29 LAB — FERRITIN: Ferritin: 8 ng/mL — ABNORMAL LOW (ref 11–307)

## 2021-08-29 MED ORDER — SODIUM CHLORIDE 0.9 % IV SOLN: INTRAVENOUS | Status: AC

## 2021-08-29 MED ORDER — DENOSUMAB 120 MG/1.7ML ~~LOC~~ SOLN
120.0000 mg | Freq: Once | SUBCUTANEOUS | Status: AC
  Administered 2021-08-29: 120 mg via SUBCUTANEOUS
  Filled 2021-08-29: qty 1.7

## 2021-08-29 MED ORDER — SODIUM CHLORIDE 0.9% FLUSH
10.0000 mL | INTRAVENOUS | Status: DC | PRN
  Administered 2021-08-29: 10 mL

## 2021-08-29 MED ORDER — SODIUM CHLORIDE 0.9 % IV SOLN
240.0000 mg | Freq: Once | INTRAVENOUS | Status: AC
  Administered 2021-08-29: 240 mg via INTRAVENOUS
  Filled 2021-08-29: qty 24

## 2021-08-29 MED ORDER — SODIUM CHLORIDE 0.9% FLUSH
10.0000 mL | Freq: Once | INTRAVENOUS | Status: AC
  Administered 2021-08-29: 10 mL

## 2021-08-29 MED ORDER — HEPARIN SOD (PORK) LOCK FLUSH 100 UNIT/ML IV SOLN
500.0000 [IU] | Freq: Once | INTRAVENOUS | Status: AC | PRN
  Administered 2021-08-29: 500 [IU]

## 2021-08-29 NOTE — Progress Notes (Signed)
Nutrition Assessment ? ? ?Reason for Assessment: MST ? ? 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 She is currently receiving nivolumab q14d and xgeva q6wk. Patient is followed by [name]. ? ?Past medical history includes CAD, gastritis, DM2, dyslipidemia, constipation, anemia, ventral hernia ? ?Met with patient during infusion. She reports appetite is too good. Patient is eating 3 meals. Breakfast is usually couple of eggs with toast, lunch varies and is the largest meal. Yesterday she had bacon and egg sandwich because her husband wanted Janine Limbo for dinner. Patient ate a bean burrito for supper. She reports history of DM and very careful about keeping track of her carbs. Patient reports she does not drink enough water, recalls 1-2 bottles. She is working to increase this. Patient endorses weight loss due to feeling nauseas for 2-3 weeks. This has resolved and reports regaining weight.  ? ?Nutrition Focused Physical Exam:  ? ?Orbital Region: moderate ?Buccal Region: mild ?Temple Region: mild ?Dorsal Hand: moderate ?Hair: reviewed  ?Eyes: reviewed ?Mouth: reviewed ?Skin: reviewed ?Nails: painted ? ? ?Medications: reviewed  ? ? ?Labs: glucose 125 ? ? ?Anthropometrics:  ? ?Height: 4'11 ?Weight: 120 lb 1.6 oz ?UBW: 125-130 lb (per pt) ?BMI: 24.26 ? ? ? ?NUTRITION DIAGNOSIS: Inadequate oral intake related to cancer and associated cancer treatment side effects as evidenced by reported nausea, decreased appetite, 7.6% (10%) under usual weight in 4 months. Insignificant for time frame, however concerning ? ? ?INTERVENTION:  ?Discussed strategies for increasing calories and protein with small frequent meals and snacks - handout with ideas given ?Encouraged source of protein with all meals/snacks for blood glucose management ?Contact information provided  ? ? ?MONITORING, EVALUATION, GOAL: Patient will tolerate increased calories and protein to minimize weight loss   ? ? ?Next Visit: To be scheduled as needed ? ? ? ? ? ? ?

## 2021-09-04 ENCOUNTER — Telehealth: Payer: Self-pay | Admitting: Hematology

## 2021-09-04 ENCOUNTER — Encounter: Payer: Self-pay | Admitting: Hematology

## 2021-09-04 NOTE — Telephone Encounter (Signed)
Scheduled follow-up appointments per 5/10 los. Patient is aware. ?

## 2021-09-04 NOTE — Progress Notes (Signed)
? ?[facility]: ?  ?DOS .08/29/2021 ? ?PCP ?[name], MD ?[address], [address] ?[place] [address] ? 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 ?DIAGNOSIS: metastatic renal cell carcinoma currently in NED status ? ?SUMMARY OF ONCOLOGIC HISTORY: ?Oncology History  ?Cancer, metastatic to bone [facility])  ?01/10/2017 Initial Diagnosis  ? Cancer, metastatic to bone [facility]) ? ?  ?01/02/2018 -  Chemotherapy  ? Patient is on Treatment Plan : RENAL CELL CARCINOMA NIVOLUMAB U04V  ? ?   ?Renal cell carcinoma ([place])  ?04/11/2017 Initial Diagnosis  ? Renal cell carcinoma ([place]) ? ?  ?01/02/2018 -  Chemotherapy  ? The patient had nivolumab (OPDIVO) 240 mg in sodium chloride 0.9 % 100 mL chemo infusion, 240 mg, Intravenous, Once, 47 of 50 cycles ?Administration: 240 mg (01/02/2018), 240 mg (01/15/2018), 240 mg (01/30/2018), 240 mg (02/13/2018), 240 mg (02/27/2018), 240 mg (03/13/2018), 240 mg (03/27/2018), 240 mg (04/10/2018), 240 mg (04/24/2018), 240 mg (05/08/2018), 240 mg (05/22/2018), 240 mg (06/05/2018), 240 mg (06/19/2018), 240 mg (08/28/2018), 240 mg (09/11/2018), 240 mg (10/09/2018), 240 mg (10/22/2018), 240 mg (11/06/2018), 240 mg (11/20/2018), 240 mg (12/04/2018), 240 mg (12/18/2018), 240 mg (01/01/2019), 240 mg (01/15/2019), 240 mg (01/29/2019), 240 mg (02/12/2019), 240 mg (02/26/2019), 240 mg (03/12/2019), 240 mg (03/26/2019), 240 mg (04/09/2019), 240 mg (04/28/2019), 240 mg (05/12/2019), 240 mg (05/27/2019), 240 mg (06/09/2019), 240 mg (06/23/2019), 240 mg (07/07/2019), 240 mg (07/21/2019), 240 mg (08/04/2019), 240 mg (08/18/2019), 240 mg (09/03/2019), 240 mg (09/15/2019), 240 mg (09/29/2019), 240 mg (10/12/2019), 240 mg (10/27/2019), 240 mg (11/10/2019), 240 mg (12/08/2019), 240 mg (12/22/2019), 240 mg (01/19/2020) ? ? for chemotherapy treatment.  ? ?  ? ? ?CURRENT THERAPY: metastatic renal cell carcinoma, Xgeva and Nivolumab ? ?INTERVAL HISTORY: ? ?Audrey [name] 81 y.o. female is here  for continued evaluation and management of her metastatic renal cell carcinoma on maintenance nivolumab immunotherapy. ?She notes no acute new symptoms suggestive of cancer progression.  Good p.o. intake. ?No acute new focal symptoms.  No fevers no chills no night sweats.  No rashes.  No issues with diarrhea. ? ?Continues to have some issues with her peripheral arterial disease and diabetic neuropathy for which she is continue to follow with her primary care physician and endocrinologist.  She is also following up with podiatry for foot cares. ?She notes she is continue to follow with vascular surgery for considerations of her PAD treatment. ?She has been seen by nephrology to consider options for nephro protection if she does need a contrast study to further evaluate and treat her PAD. ? ?No notable side effects from her previous dose of nivolumab at this time. ?Labs done today were discussed with the patient. ? ?Patient Active Problem List  ? Diagnosis Date Noted  ? Type 2 diabetes mellitus ([place]) 08/15/2021  ? Incisional hernia 06/23/2020  ? Gastritis and gastroduodenitis 02/26/2020  ? Ventral hernia without obstruction or gangrene 02/26/2020  ? Rectal discomfort 02/26/2020  ? History of colonic polyps 02/26/2020  ? History of cholecystectomy 07/17/2019  ? Anemia 10/10/2018  ? 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 Acute cholecystitis 08/29/2018  ? Constipation 08/23/2018  ? Abdominal pain 08/23/2018  ? 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 Preop cardiovascular exam 08/14/2018  ? Coronary artery disease involving native coronary artery of native heart without angina pectoris 08/14/2018  ? Dyslipidemia 08/14/2018  ? Abnormal LFTs 08/05/2018  ? Choledocholithiasis 08/05/2018  ? Dilated bile duct 08/05/2018  ?  History of ERCP 08/05/2018  ? History of renal cell carcinoma 08/05/2018  ? Port-A-Cath in place 01/30/2018  ? Counseling regarding advance care planning and goals of care 01/04/2018  ? Renal cell carcinoma ([place])  04/11/2017  ? Cancer, 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[place]) 01/10/2017  ? Left renal mass 08/01/2016  ? ? ?is allergic to doxycycline, adhesive [tape], gabapentin (once-daily), nsaids, and statins. ? ?MEDICAL HISTORY: ?Past Medical History:  ?Diagnosis Date  ? Anemia   ? Arthritis   ? lower back, hips, hands  ? Biliary stricture   ? Diabetes mellitus ([place])   ? type 2   ? Early cataracts, bilateral   ? Md just watching  ? Elevated liver enzymes   ? Family history of adverse reaction to anesthesia   ? Daughter hard to wake up  ? Gallstones   ? GERD (gastroesophageal reflux disease)   ? occasional - diet controlled  ? History of blood transfusion 2018  ? History of hiatal hernia   ? HTN (hypertension)   ? Hyperlipidemia   ? Hypothyroidism   ? left renal ca dx'd 2018  ? renal cancer - left kidney removed, pill chemo x 1 yr  ? Myocardial infarction [facility]) 1991  ? no deficits  ? SVD (spontaneous vaginal delivery)   ? x 3  ? Wears glasses   ? ? ?SURGICAL HISTORY: ?Past Surgical History:  ?Procedure Laterality Date  ? BALLOON DILATION N/A 07/23/2018  ? Procedure: BALLOON DILATION;  Surgeon: Irving Copas., MD;  Location: [facility];  Service: Gastroenterology;  Laterality: N/A;  ? BILIARY BRUSHING  08/10/2018  ? Procedure: BILIARY BRUSHING;  Surgeon: Rush Landmark Telford Nab., MD;  Location: [place];  Service: Gastroenterology;;  ? BILIARY BRUSHING  11/30/2018  ? Procedure: BILIARY BRUSHING;  Surgeon: Rush Landmark Telford Nab., MD;  Location: [place];  Service: Gastroenterology;;  ? BILIARY DILATION  08/10/2018  ? Procedure: BILIARY DILATION;  Surgeon: Rush Landmark Telford Nab., MD;  Location: [place];  Service: Gastroenterology;;  ? BILIARY DILATION  08/27/2018  ? Procedure: BILIARY DILATION;  Surgeon: Rush Landmark Telford Nab., MD;  Location: [facility];  Service: Gastroenterology;;  ? BILIARY DILATION  01/24/2020  ? Procedure: BILIARY DILATION;  Surgeon: Rush Landmark Telford Nab., MD;  Location: [place] [place];  Service:  Gastroenterology;;  ? BILIARY STENT PLACEMENT  08/10/2018  ? Procedure: BILIARY STENT PLACEMENT;  Surgeon: Rush Landmark Telford Nab., MD;  Location: [place];  Service: Gastroenterology;;  ? BILIARY STENT PLACEMENT  08/27/2018  ? Procedure: BILIARY STENT PLACEMENT;  Surgeon: Rush Landmark Telford Nab., MD;  Location: [place];  Service: Gastroenterology;;  ? BILIARY STENT PLACEMENT  11/30/2018  ? Procedure: BILIARY STENT PLACEMENT;  Surgeon: Irving Copas., MD;  Location: [facility];  Service: Gastroenterology;;  ? BIOPSY  07/23/2018  ? Procedure: BIOPSY;  Surgeon: Irving Copas., MD;  Location: [place];  Service: Gastroenterology;;  ? BIOPSY  01/24/2020  ? Procedure: BIOPSY;  Surgeon: Irving Copas., MD;  Location: [place];  Service: Gastroenterology;;  ? CHOLECYSTECTOMY N/A 09/02/2018  ? Procedure: LAPAROSCOPIC CHOLECYSTECTOMY;  Surgeon: Stark Klein, MD;  Location: [place];  Service: General;  Laterality: N/A;  ? COLONOSCOPY    ? normal   ? ENDOSCOPIC RETROGRADE CHOLANGIOPANCREATOGRAPHY (ERCP) WITH PROPOFOL N/A 08/10/2018  ? Procedure: ENDOSCOPIC RETROGRADE CHOLANGIOPANCREATOGRAPHY (ERCP) WITH PROPOFOL;  Surgeon: Rush Landmark Telford Nab., MD;  Location: [place] [facility];  Service: Gastroenterology;  Laterality: N/A;  ? ENDOSCOPIC RETROGRADE CHOLANGIOPANCREATOGRAPHY (ERCP) WITH PROPOFOL N/A 11/30/2018  ? Procedure: ENDOSCOPIC RETROGRADE CHOLANGIOPANCREATOGRAPHY (ERCP) WITH PROPOFOL;  Surgeon: Irving Copas., MD;  Location: [employer];  Service: Gastroenterology;  Laterality: N/A;  ? ENDOSCOPIC RETROGRADE CHOLANGIOPANCREATOGRAPHY (ERCP) WITH PROPOFOL N/A 01/24/2020  ? Procedure: ENDOSCOPIC RETROGRADE CHOLANGIOPANCREATOGRAPHY (ERCP) WITH PROPOFOL;  Surgeon: Rush Landmark Telford Nab., MD;  Location: [place];  Service: Gastroenterology;  Laterality: N/A;  ? ERCP N/A 07/23/2018  ? Procedure: ENDOSCOPIC RETROGRADE CHOLANGIOPANCREATOGRAPHY (ERCP);  Surgeon: Irving Copas.,  MD;  Location: [place];  Service: Gastroenterology;  Laterality: N/A;  ? ERCP N/A 08/27/2018  ? Procedure: ENDOSCOPIC RETROGRADE CHOLANGIOPANCREATOGRAPHY (ERCP);  Surgeon: Irving Copas., MD;  Location:

## 2021-09-12 ENCOUNTER — Inpatient Hospital Stay: Payer: Medicare Other

## 2021-09-12 ENCOUNTER — Other Ambulatory Visit: Payer: Self-pay | Admitting: Hematology

## 2021-09-14 ENCOUNTER — Other Ambulatory Visit: Payer: Self-pay

## 2021-09-14 ENCOUNTER — Inpatient Hospital Stay: Payer: Medicare Other

## 2021-09-14 VITALS — BP 122/58 | HR 70 | Temp 97.9°F | Resp 17 | Wt 120.2 lb

## 2021-09-14 DIAGNOSIS — C7951 Secondary malignant neoplasm of bone: Secondary | ICD-10-CM

## 2021-09-14 DIAGNOSIS — Z7189 Other specified counseling: Secondary | ICD-10-CM

## 2021-09-14 DIAGNOSIS — Z5112 Encounter for antineoplastic immunotherapy: Secondary | ICD-10-CM | POA: Diagnosis not present

## 2021-09-14 DIAGNOSIS — C642 Malignant neoplasm of left kidney, except renal pelvis: Secondary | ICD-10-CM

## 2021-09-14 DIAGNOSIS — Z95828 Presence of other vascular implants and grafts: Secondary | ICD-10-CM

## 2021-09-14 LAB — CMP (CANCER CENTER ONLY)
ALT: 15 U/L (ref 0–44)
AST: 18 U/L (ref 15–41)
Albumin: 3.7 g/dL (ref 3.5–5.0)
Alkaline Phosphatase: 52 U/L (ref 38–126)
Anion gap: 7 (ref 5–15)
BUN: 32 mg/dL — ABNORMAL HIGH (ref 8–23)
CO2: 20 mmol/L — ABNORMAL LOW (ref 22–32)
Calcium: 9 mg/dL (ref 8.9–10.3)
Chloride: 109 mmol/L (ref 98–111)
Creatinine: 1.87 mg/dL — ABNORMAL HIGH (ref 0.44–1.00)
GFR, Estimated: 27 mL/min — ABNORMAL LOW (ref >60)
Glucose, Bld: 94 mg/dL (ref 70–99)
Potassium: 3.9 mmol/L (ref 3.5–5.1)
Sodium: 136 mmol/L (ref 135–145)
Total Bilirubin: 0.4 mg/dL (ref 0.3–1.2)
Total Protein: 6.2 g/dL — ABNORMAL LOW (ref 6.5–8.1)

## 2021-09-14 LAB — CBC WITH DIFFERENTIAL (CANCER CENTER ONLY)
Abs Immature Granulocytes: 0.11 10*3/uL — ABNORMAL HIGH (ref 0.00–0.07)
Basophils Absolute: 0 10*3/uL (ref 0.0–0.1)
Basophils Relative: 0 %
Eosinophils Absolute: 0.1 10*3/uL (ref 0.0–0.5)
Eosinophils Relative: 1 %
HCT: 32.4 % — ABNORMAL LOW (ref 36.0–46.0)
Hemoglobin: 10.1 g/dL — ABNORMAL LOW (ref 12.0–15.0)
Immature Granulocytes: 1 %
Lymphocytes Relative: 11 %
Lymphs Abs: 1.2 10*3/uL (ref 0.7–4.0)
MCH: 24.9 pg — ABNORMAL LOW (ref 26.0–34.0)
MCHC: 31.2 g/dL (ref 30.0–36.0)
MCV: 79.8 fL — ABNORMAL LOW (ref 80.0–100.0)
Monocytes Absolute: 1.2 10*3/uL — ABNORMAL HIGH (ref 0.1–1.0)
Monocytes Relative: 11 %
Neutro Abs: 8.2 10*3/uL — ABNORMAL HIGH (ref 1.7–7.7)
Neutrophils Relative %: 76 %
Platelet Count: 325 10*3/uL (ref 150–400)
RBC: 4.06 MIL/uL (ref 3.87–5.11)
RDW: 18.3 % — ABNORMAL HIGH (ref 11.5–15.5)
WBC Count: 10.9 10*3/uL — ABNORMAL HIGH (ref 4.0–10.5)
nRBC: 0 % (ref 0.0–0.2)

## 2021-09-14 MED ORDER — SODIUM CHLORIDE 0.9 % IV SOLN
300.0000 mg | Freq: Once | INTRAVENOUS | Status: AC
  Administered 2021-09-14: 300 mg via INTRAVENOUS
  Filled 2021-09-14: qty 300

## 2021-09-14 MED ORDER — SODIUM CHLORIDE 0.9 % IV SOLN: INTRAVENOUS | Status: AC

## 2021-09-14 MED ORDER — SODIUM CHLORIDE 0.9% FLUSH
10.0000 mL | INTRAVENOUS | Status: DC | PRN
  Administered 2021-09-14: 10 mL

## 2021-09-14 MED ORDER — HEPARIN SOD (PORK) LOCK FLUSH 100 UNIT/ML IV SOLN
500.0000 [IU] | Freq: Once | INTRAVENOUS | Status: AC | PRN
  Administered 2021-09-14: 500 [IU]

## 2021-09-14 MED ORDER — SODIUM CHLORIDE 0.9 % IV SOLN
240.0000 mg | Freq: Once | INTRAVENOUS | Status: AC
  Administered 2021-09-14: 240 mg via INTRAVENOUS
  Filled 2021-09-14: qty 24

## 2021-09-14 MED ORDER — SODIUM CHLORIDE 0.9% FLUSH
10.0000 mL | Freq: Once | INTRAVENOUS | Status: AC
  Administered 2021-09-14: 10 mL

## 2021-09-14 MED ORDER — SODIUM CHLORIDE 0.9 % IV SOLN: Freq: Once | INTRAVENOUS | Status: AC

## 2021-09-14 MED ORDER — LORATADINE 10 MG PO TABS
10.0000 mg | ORAL_TABLET | Freq: Once | ORAL | Status: AC
  Administered 2021-09-14: 10 mg via ORAL
  Filled 2021-09-14: qty 1

## 2021-09-14 MED ORDER — ACETAMINOPHEN 325 MG PO TABS
650.0000 mg | ORAL_TABLET | Freq: Once | ORAL | Status: AC
  Administered 2021-09-14: 650 mg via ORAL
  Filled 2021-09-14: qty 2

## 2021-09-14 NOTE — Patient Instructions (Signed)
[facility] ONCOLOGY  Discharge Instructions: Thank you for choosing [facility] to provide your oncology and hematology care.   If you have a lab appointment with the [facility], please go directly to the [facility] and check in at the registration area.   Wear comfortable clothing and clothing appropriate for easy access to any Portacath or PICC line.   We strive to give you quality time with your provider. You may need to reschedule your appointment if you arrive late (15 or more minutes).  Arriving late affects you and other patients whose appointments are after yours.  Also, if you miss three or more appointments without notifying the office, you may be dismissed from the clinic at the provider's discretion.      For prescription refill requests, have your pharmacy contact our office and allow 72 hours for refills to be completed.    Today you received the following chemotherapy and/or immunotherapy agents: Opdivo      To help prevent nausea and vomiting after your treatment, we encourage you to take your nausea medication as directed.  BELOW ARE SYMPTOMS THAT SHOULD BE REPORTED IMMEDIATELY: *FEVER GREATER THAN 100.4 F (38 C) OR HIGHER *CHILLS OR SWEATING *NAUSEA AND VOMITING THAT IS NOT CONTROLLED WITH YOUR NAUSEA MEDICATION *UNUSUAL SHORTNESS OF BREATH *UNUSUAL BRUISING OR BLEEDING *URINARY PROBLEMS (pain or burning when urinating, or frequent urination) *BOWEL PROBLEMS (unusual diarrhea, constipation, pain near the anus) TENDERNESS IN MOUTH AND THROAT WITH OR WITHOUT PRESENCE OF ULCERS (sore throat, sores in mouth, or a toothache) UNUSUAL RASH, SWELLING OR PAIN  UNUSUAL VAGINAL DISCHARGE OR ITCHING   Items with * indicate a potential emergency and should be followed up as soon as possible or go to the Emergency Department if any problems should occur.  Please show the CHEMOTHERAPY ALERT CARD or IMMUNOTHERAPY ALERT CARD at check-in to the  Emergency Department and triage nurse.  Should you have questions after your visit or need to cancel or reschedule your appointment, please contact [facility]  Dept: [phone]  and follow the prompts.  Office hours are 8:00 a.m. to 4:30 p.m. Monday - Friday. Please note that voicemails left after 4:00 p.m. may not be returned until the following business day.  We are closed weekends and major holidays. You have access to a nurse at all times for urgent questions. Please call the main number to the clinic Dept: [phone] and follow the prompts.   For any non-urgent questions, you may also contact your provider using MyChart. We now offer e-Visits for anyone 77 and older to request care online for non-urgent symptoms. For details visit [email or web address].   Also download the MyChart app! Go to the app store, search "MyChart", open the app, select [facility], and log in with your MyChart username and password.  Due to Covid, a mask is required upon entering the hospital/clinic. If you do not have a mask, one will be given to you upon arrival. For doctor visits, patients may have 1 support person aged 68 or older with them. For treatment visits, patients cannot have anyone with them due to current Covid guidelines and our immunocompromised population.   Iron Sucrose Injection What is this medication? IRON SUCROSE (EYE ern SOO krose) treats low levels of iron (iron deficiency anemia) in people with kidney disease. Iron is a mineral that plays an important role in making red blood cells, which carry oxygen from your lungs to the rest  of your body. This medicine may be used for other purposes; ask your health care provider or pharmacist if you have questions. COMMON BRAND NAME(S): Venofer What should I tell my care team before I take this medication? They need to know if you have any of these conditions: Anemia not caused by low iron levels Heart disease High  levels of iron in the blood Kidney disease Liver disease An unusual or allergic reaction to iron, other medications, foods, dyes, or preservatives Pregnant or trying to get pregnant Breast-feeding How should I use this medication? This medication is for infusion into a vein. It is given in a hospital or clinic setting. Talk to your care team about the use of this medication in children. While this medication may be prescribed for children as young as 2 years for selected conditions, precautions do apply. Overdosage: If you think you have taken too much of this medicine contact a poison control center or emergency room at once. NOTE: This medicine is only for you. Do not share this medicine with others. What if I miss a dose? It is important not to miss your dose. Call your care team if you are unable to keep an appointment. What may interact with this medication? Do not take this medication with any of the following: Deferoxamine Dimercaprol Other iron products This medication may also interact with the following: Chloramphenicol Deferasirox This list may not describe all possible interactions. Give your health care provider a list of all the medicines, herbs, non-prescription drugs, or dietary supplements you use. Also tell them if you smoke, drink alcohol, or use illegal drugs. Some items may interact with your medicine. What should I watch for while using this medication? Visit your care team regularly. Tell your care team if your symptoms do not start to get better or if they get worse. You may need blood work done while you are taking this medication. You may need to follow a special diet. Talk to your care team. Foods that contain iron include: whole grains/cereals, dried fruits, beans, or peas, leafy green vegetables, and organ meats (liver, kidney). What side effects may I notice from receiving this medication? Side effects that you should report to your care team as soon as  possible: Allergic reactions--skin rash, itching, hives, swelling of the face, lips, tongue, or throat Low blood pressure--dizziness, feeling faint or lightheaded, blurry vision Shortness of breath Side effects that usually do not require medical attention (report to your care team if they continue or are bothersome): Flushing Headache Joint pain Muscle pain Nausea Pain, redness, or irritation at injection site This list may not describe all possible side effects. Call your doctor for medical advice about side effects. You may report side effects to FDA at 1-800-FDA-1088. Where should I keep my medication? This medication is given in a hospital or clinic and will not be stored at home. NOTE: This sheet is a summary. It may not cover all possible information. If you have questions about this medicine, talk to your doctor, pharmacist, or health care provider.  2023 Elsevier/Gold Standard (2020-09-01 00:00:00)

## 2021-09-26 ENCOUNTER — Other Ambulatory Visit: Payer: Self-pay | Admitting: Hematology

## 2021-09-26 ENCOUNTER — Inpatient Hospital Stay: Payer: Medicare Other | Attending: Hematology

## 2021-09-26 ENCOUNTER — Inpatient Hospital Stay: Payer: Medicare Other

## 2021-09-26 ENCOUNTER — Other Ambulatory Visit: Payer: Self-pay

## 2021-09-26 VITALS — BP 146/60 | HR 70 | Temp 97.8°F | Resp 16 | Ht 59.0 in | Wt 121.0 lb

## 2021-09-26 DIAGNOSIS — C7951 Secondary malignant neoplasm of bone: Secondary | ICD-10-CM | POA: Diagnosis not present

## 2021-09-26 DIAGNOSIS — C642 Malignant neoplasm of left kidney, except renal pelvis: Secondary | ICD-10-CM

## 2021-09-26 DIAGNOSIS — Z79899 Other long term (current) drug therapy: Secondary | ICD-10-CM | POA: Diagnosis not present

## 2021-09-26 DIAGNOSIS — Z7189 Other specified counseling: Secondary | ICD-10-CM

## 2021-09-26 DIAGNOSIS — Z95828 Presence of other vascular implants and grafts: Secondary | ICD-10-CM

## 2021-09-26 DIAGNOSIS — Z5112 Encounter for antineoplastic immunotherapy: Secondary | ICD-10-CM | POA: Insufficient documentation

## 2021-09-26 LAB — CBC WITH DIFFERENTIAL (CANCER CENTER ONLY)
Abs Immature Granulocytes: 0.1 10*3/uL — ABNORMAL HIGH (ref 0.00–0.07)
Basophils Absolute: 0 10*3/uL (ref 0.0–0.1)
Basophils Relative: 1 %
Eosinophils Absolute: 0.2 10*3/uL (ref 0.0–0.5)
Eosinophils Relative: 2 %
HCT: 34.7 % — ABNORMAL LOW (ref 36.0–46.0)
Hemoglobin: 10.7 g/dL — ABNORMAL LOW (ref 12.0–15.0)
Immature Granulocytes: 1 %
Lymphocytes Relative: 16 %
Lymphs Abs: 1.4 10*3/uL (ref 0.7–4.0)
MCH: 25.4 pg — ABNORMAL LOW (ref 26.0–34.0)
MCHC: 30.8 g/dL (ref 30.0–36.0)
MCV: 82.2 fL (ref 80.0–100.0)
Monocytes Absolute: 1.1 10*3/uL — ABNORMAL HIGH (ref 0.1–1.0)
Monocytes Relative: 13 %
Neutro Abs: 5.9 10*3/uL (ref 1.7–7.7)
Neutrophils Relative %: 67 %
Platelet Count: 324 10*3/uL (ref 150–400)
RBC: 4.22 MIL/uL (ref 3.87–5.11)
RDW: 20.5 % — ABNORMAL HIGH (ref 11.5–15.5)
WBC Count: 8.8 10*3/uL (ref 4.0–10.5)
nRBC: 0 % (ref 0.0–0.2)

## 2021-09-26 LAB — CMP (CANCER CENTER ONLY)
ALT: 18 U/L (ref 0–44)
AST: 19 U/L (ref 15–41)
Albumin: 3.8 g/dL (ref 3.5–5.0)
Alkaline Phosphatase: 52 U/L (ref 38–126)
Anion gap: 6 (ref 5–15)
BUN: 28 mg/dL — ABNORMAL HIGH (ref 8–23)
CO2: 20 mmol/L — ABNORMAL LOW (ref 22–32)
Calcium: 9 mg/dL (ref 8.9–10.3)
Chloride: 112 mmol/L — ABNORMAL HIGH (ref 98–111)
Creatinine: 1.76 mg/dL — ABNORMAL HIGH (ref 0.44–1.00)
GFR, Estimated: 29 mL/min — ABNORMAL LOW (ref >60)
Glucose, Bld: 91 mg/dL (ref 70–99)
Potassium: 3.7 mmol/L (ref 3.5–5.1)
Sodium: 138 mmol/L (ref 135–145)
Total Bilirubin: 0.3 mg/dL (ref 0.3–1.2)
Total Protein: 6.2 g/dL — ABNORMAL LOW (ref 6.5–8.1)

## 2021-09-26 MED ORDER — SODIUM CHLORIDE 0.9 % IV SOLN: INTRAVENOUS | Status: AC

## 2021-09-26 MED ORDER — SODIUM CHLORIDE 0.9% FLUSH
10.0000 mL | INTRAVENOUS | Status: DC | PRN
  Administered 2021-09-26: 10 mL

## 2021-09-26 MED ORDER — LORATADINE 10 MG PO TABS
10.0000 mg | ORAL_TABLET | Freq: Once | ORAL | Status: AC
  Administered 2021-09-26: 10 mg via ORAL
  Filled 2021-09-26: qty 1

## 2021-09-26 MED ORDER — ACETAMINOPHEN 325 MG PO TABS
650.0000 mg | ORAL_TABLET | Freq: Once | ORAL | Status: AC
  Administered 2021-09-26: 650 mg via ORAL
  Filled 2021-09-26: qty 2

## 2021-09-26 MED ORDER — SODIUM CHLORIDE 0.9% FLUSH
10.0000 mL | Freq: Once | INTRAVENOUS | Status: AC
  Administered 2021-09-26: 10 mL

## 2021-09-26 MED ORDER — SODIUM CHLORIDE 0.9 % IV SOLN
300.0000 mg | Freq: Once | INTRAVENOUS | Status: AC
  Administered 2021-09-26: 300 mg via INTRAVENOUS
  Filled 2021-09-26: qty 300

## 2021-09-26 MED ORDER — HEPARIN SOD (PORK) LOCK FLUSH 100 UNIT/ML IV SOLN
500.0000 [IU] | Freq: Once | INTRAVENOUS | Status: AC | PRN
  Administered 2021-09-26: 500 [IU]

## 2021-09-26 MED ORDER — SODIUM CHLORIDE 0.9 % IV SOLN
240.0000 mg | Freq: Once | INTRAVENOUS | Status: AC
  Administered 2021-09-26: 240 mg via INTRAVENOUS
  Filled 2021-09-26: qty 24

## 2021-09-26 MED ORDER — SODIUM CHLORIDE 0.9 % IV SOLN: Freq: Once | INTRAVENOUS | Status: AC

## 2021-09-26 NOTE — Patient Instructions (Signed)
[facility] ONCOLOGY   Discharge Instructions: Thank you for choosing [facility] to provide your oncology and hematology care.   If you have a lab appointment with the [facility], please go directly to the [facility] and check in at the registration area.   Wear comfortable clothing and clothing appropriate for easy access to any Portacath or PICC line.   We strive to give you quality time with your provider. You may need to reschedule your appointment if you arrive late (15 or more minutes).  Arriving late affects you and other patients whose appointments are after yours.  Also, if you miss three or more appointments without notifying the office, you may be dismissed from the clinic at the provider's discretion.      For prescription refill requests, have your pharmacy contact our office and allow 72 hours for refills to be completed.    Today you received the following chemotherapy and/or immunotherapy agents: nivolumab      To help prevent nausea and vomiting after your treatment, we encourage you to take your nausea medication as directed.  BELOW ARE SYMPTOMS THAT SHOULD BE REPORTED IMMEDIATELY: *FEVER GREATER THAN 100.4 F (38 C) OR HIGHER *CHILLS OR SWEATING *NAUSEA AND VOMITING THAT IS NOT CONTROLLED WITH YOUR NAUSEA MEDICATION *UNUSUAL SHORTNESS OF BREATH *UNUSUAL BRUISING OR BLEEDING *URINARY PROBLEMS (pain or burning when urinating, or frequent urination) *BOWEL PROBLEMS (unusual diarrhea, constipation, pain near the anus) TENDERNESS IN MOUTH AND THROAT WITH OR WITHOUT PRESENCE OF ULCERS (sore throat, sores in mouth, or a toothache) UNUSUAL RASH, SWELLING OR PAIN  UNUSUAL VAGINAL DISCHARGE OR ITCHING   Items with * indicate a potential emergency and should be followed up as soon as possible or go to the Emergency Department if any problems should occur.  Please show the CHEMOTHERAPY ALERT CARD or IMMUNOTHERAPY ALERT CARD at check-in  to the Emergency Department and triage nurse.  Should you have questions after your visit or need to cancel or reschedule your appointment, please contact [facility]  Dept: [phone]  and follow the prompts.  Office hours are 8:00 a.m. to 4:30 p.m. Monday - Friday. Please note that voicemails left after 4:00 p.m. may not be returned until the following business day.  We are closed weekends and major holidays. You have access to a nurse at all times for urgent questions. Please call the main number to the clinic Dept: [phone] and follow the prompts.   For any non-urgent questions, you may also contact your provider using MyChart. We now offer e-Visits for anyone 36 and older to request care online for non-urgent symptoms. For details visit [email or web address].   Also download the MyChart app! Go to the app store, search "MyChart", open the app, select [facility], and log in with your MyChart username and password.  Due to Covid, a mask is required upon entering the hospital/clinic. If you do not have a mask, one will be given to you upon arrival. For doctor visits, patients may have 1 support person aged 2 or older with them. For treatment visits, patients cannot have anyone with them due to current Covid guidelines and our immunocompromised population.   Iron Sucrose Injection What is this medication? IRON SUCROSE (EYE ern SOO krose) treats low levels of iron (iron deficiency anemia) in people with kidney disease. Iron is a mineral that plays an important role in making red blood cells, which carry oxygen from your lungs to the  rest of your body. This medicine may be used for other purposes; ask your health care provider or pharmacist if you have questions. COMMON BRAND NAME(S): Venofer What should I tell my care team before I take this medication? They need to know if you have any of these conditions: Anemia not caused by low iron levels Heart  disease High levels of iron in the blood Kidney disease Liver disease An unusual or allergic reaction to iron, other medications, foods, dyes, or preservatives Pregnant or trying to get pregnant Breast-feeding How should I use this medication? This medication is for infusion into a vein. It is given in a hospital or clinic setting. Talk to your care team about the use of this medication in children. While this medication may be prescribed for children as young as 2 years for selected conditions, precautions do apply. Overdosage: If you think you have taken too much of this medicine contact a poison control center or emergency room at once. NOTE: This medicine is only for you. Do not share this medicine with others. What if I miss a dose? It is important not to miss your dose. Call your care team if you are unable to keep an appointment. What may interact with this medication? Do not take this medication with any of the following: Deferoxamine Dimercaprol Other iron products This medication may also interact with the following: Chloramphenicol Deferasirox This list may not describe all possible interactions. Give your health care provider a list of all the medicines, herbs, non-prescription drugs, or dietary supplements you use. Also tell them if you smoke, drink alcohol, or use illegal drugs. Some items may interact with your medicine. What should I watch for while using this medication? Visit your care team regularly. Tell your care team if your symptoms do not start to get better or if they get worse. You may need blood work done while you are taking this medication. You may need to follow a special diet. Talk to your care team. Foods that contain iron include: whole grains/cereals, dried fruits, beans, or peas, leafy green vegetables, and organ meats (liver, kidney). What side effects may I notice from receiving this medication? Side effects that you should report to your care team as  soon as possible: Allergic reactions--skin rash, itching, hives, swelling of the face, lips, tongue, or throat Low blood pressure--dizziness, feeling faint or lightheaded, blurry vision Shortness of breath Side effects that usually do not require medical attention (report to your care team if they continue or are bothersome): Flushing Headache Joint pain Muscle pain Nausea Pain, redness, or irritation at injection site This list may not describe all possible side effects. Call your doctor for medical advice about side effects. You may report side effects to FDA at 1-800-FDA-1088. Where should I keep my medication? This medication is given in a hospital or clinic and will not be stored at home. NOTE: This sheet is a summary. It may not cover all possible information. If you have questions about this medicine, talk to your doctor, pharmacist, or health care provider.  2023 Elsevier/Gold Standard (2020-09-01 00:00:00)

## 2021-10-10 ENCOUNTER — Inpatient Hospital Stay: Payer: Medicare Other

## 2021-10-10 ENCOUNTER — Other Ambulatory Visit: Payer: Self-pay

## 2021-10-10 VITALS — BP 128/54 | HR 68 | Temp 98.2°F | Resp 16

## 2021-10-10 DIAGNOSIS — C7951 Secondary malignant neoplasm of bone: Secondary | ICD-10-CM

## 2021-10-10 DIAGNOSIS — C642 Malignant neoplasm of left kidney, except renal pelvis: Secondary | ICD-10-CM

## 2021-10-10 DIAGNOSIS — Z95828 Presence of other vascular implants and grafts: Secondary | ICD-10-CM

## 2021-10-10 DIAGNOSIS — Z5112 Encounter for antineoplastic immunotherapy: Secondary | ICD-10-CM | POA: Diagnosis not present

## 2021-10-10 DIAGNOSIS — Z7189 Other specified counseling: Secondary | ICD-10-CM

## 2021-10-10 LAB — CBC WITH DIFFERENTIAL (CANCER CENTER ONLY)
Abs Immature Granulocytes: 0.15 10*3/uL — ABNORMAL HIGH (ref 0.00–0.07)
Basophils Absolute: 0.1 10*3/uL (ref 0.0–0.1)
Basophils Relative: 1 %
Eosinophils Absolute: 0.1 10*3/uL (ref 0.0–0.5)
Eosinophils Relative: 1 %
HCT: 36.1 % (ref 36.0–46.0)
Hemoglobin: 11.2 g/dL — ABNORMAL LOW (ref 12.0–15.0)
Immature Granulocytes: 1 %
Lymphocytes Relative: 11 %
Lymphs Abs: 1.2 10*3/uL (ref 0.7–4.0)
MCH: 26.6 pg (ref 26.0–34.0)
MCHC: 31 g/dL (ref 30.0–36.0)
MCV: 85.7 fL (ref 80.0–100.0)
Monocytes Absolute: 1.4 10*3/uL — ABNORMAL HIGH (ref 0.1–1.0)
Monocytes Relative: 13 %
Neutro Abs: 8 10*3/uL — ABNORMAL HIGH (ref 1.7–7.7)
Neutrophils Relative %: 73 %
Platelet Count: 274 10*3/uL (ref 150–400)
RBC: 4.21 MIL/uL (ref 3.87–5.11)
RDW: 21.2 % — ABNORMAL HIGH (ref 11.5–15.5)
WBC Count: 10.9 10*3/uL — ABNORMAL HIGH (ref 4.0–10.5)
nRBC: 0 % (ref 0.0–0.2)

## 2021-10-10 LAB — CMP (CANCER CENTER ONLY)
ALT: 26 U/L (ref 0–44)
AST: 20 U/L (ref 15–41)
Albumin: 3.8 g/dL (ref 3.5–5.0)
Alkaline Phosphatase: 55 U/L (ref 38–126)
Anion gap: 5 (ref 5–15)
BUN: 28 mg/dL — ABNORMAL HIGH (ref 8–23)
CO2: 23 mmol/L (ref 22–32)
Calcium: 9 mg/dL (ref 8.9–10.3)
Chloride: 109 mmol/L (ref 98–111)
Creatinine: 1.66 mg/dL — ABNORMAL HIGH (ref 0.44–1.00)
GFR, Estimated: 31 mL/min — ABNORMAL LOW (ref >60)
Glucose, Bld: 116 mg/dL — ABNORMAL HIGH (ref 70–99)
Potassium: 4.4 mmol/L (ref 3.5–5.1)
Sodium: 137 mmol/L (ref 135–145)
Total Bilirubin: 0.3 mg/dL (ref 0.3–1.2)
Total Protein: 6.2 g/dL — ABNORMAL LOW (ref 6.5–8.1)

## 2021-10-10 MED ORDER — DENOSUMAB 120 MG/1.7ML ~~LOC~~ SOLN
120.0000 mg | Freq: Once | SUBCUTANEOUS | Status: AC
  Administered 2021-10-10: 120 mg via SUBCUTANEOUS
  Filled 2021-10-10: qty 1.7

## 2021-10-10 MED ORDER — SODIUM CHLORIDE 0.9 % IV SOLN: Freq: Once | INTRAVENOUS | Status: AC

## 2021-10-10 MED ORDER — SODIUM CHLORIDE 0.9 % IV SOLN: INTRAVENOUS | Status: AC

## 2021-10-10 MED ORDER — HEPARIN SOD (PORK) LOCK FLUSH 100 UNIT/ML IV SOLN
500.0000 [IU] | Freq: Once | INTRAVENOUS | Status: AC | PRN
  Administered 2021-10-10: 500 [IU]

## 2021-10-10 MED ORDER — SODIUM CHLORIDE 0.9% FLUSH
10.0000 mL | Freq: Once | INTRAVENOUS | Status: AC
  Administered 2021-10-10: 10 mL

## 2021-10-10 MED ORDER — SODIUM CHLORIDE 0.9 % IV SOLN
240.0000 mg | Freq: Once | INTRAVENOUS | Status: AC
  Administered 2021-10-10: 240 mg via INTRAVENOUS
  Filled 2021-10-10: qty 24

## 2021-10-10 MED ORDER — SODIUM CHLORIDE 0.9% FLUSH
10.0000 mL | INTRAVENOUS | Status: DC | PRN
  Administered 2021-10-10: 10 mL

## 2021-10-10 NOTE — Patient Instructions (Addendum)
[facility]  Discharge Instructions: Thank you for choosing [facility] to provide your oncology and hematology care.   If you have a lab appointment with the Cancer Center, please go directly to the Cancer Center and check in at the registration area.   Wear comfortable clothing and clothing appropriate for easy access to any Portacath or PICC line.   We strive to give you quality time with your provider. You may need to reschedule your appointment if you arrive late (15 or more minutes).  Arriving late affects you and other patients whose appointments are after yours.  Also, if you miss three or more appointments without notifying the office, you may be dismissed from the clinic at the provider's discretion.      For prescription refill requests, have your pharmacy contact our office and allow 72 hours for refills to be completed.    Today you received the following chemotherapy and/or immunotherapy agents: Opdivo.      To help prevent nausea and vomiting after your treatment, we encourage you to take your nausea medication as directed.  BELOW ARE SYMPTOMS THAT SHOULD BE REPORTED IMMEDIATELY: *FEVER GREATER THAN 100.4 F (38 C) OR HIGHER *CHILLS OR SWEATING *NAUSEA AND VOMITING THAT IS NOT CONTROLLED WITH YOUR NAUSEA MEDICATION *UNUSUAL SHORTNESS OF BREATH *UNUSUAL BRUISING OR BLEEDING *URINARY PROBLEMS (pain or burning when urinating, or frequent urination) *BOWEL PROBLEMS (unusual diarrhea, constipation, pain near the anus) TENDERNESS IN MOUTH AND THROAT WITH OR WITHOUT PRESENCE OF ULCERS (sore throat, sores in mouth, or a toothache) UNUSUAL RASH, SWELLING OR PAIN  UNUSUAL VAGINAL DISCHARGE OR ITCHING   Items with * indicate a potential emergency and should be followed up as soon as possible or go to the Emergency Department if any problems should occur.  Please show the CHEMOTHERAPY ALERT CARD or IMMUNOTHERAPY ALERT CARD at check-in to  the Emergency Department and triage nurse.  Should you have questions after your visit or need to cancel or reschedule your appointment, please contact [facility]  Dept: [phone]  and follow the prompts.  Office hours are 8:00 a.m. to 4:30 p.m. Monday - Friday. Please note that voicemails left after 4:00 p.m. may not be returned until the following business day.  We are closed weekends and major holidays. You have access to a nurse at all times for urgent questions. Please call the main number to the clinic Dept: [phone] and follow the prompts.   For any non-urgent questions, you may also contact your provider using MyChart. We now offer e-Visits for anyone 18 and older to request care online for non-urgent symptoms. For details visit [email or web address].   Also download the MyChart app! Go to the app store, search "MyChart", open the app, select [facility], and log in with your MyChart username and password.  Masks are optional in the cancer centers. If you would like for your care team to wear a mask while they are taking care of you, please let them know. For doctor visits, patients may have with them one support person who is at least 80 years old. At this time, visitors are not allowed in the infusion area. Nivolumab injection What is this medication? NIVOLUMAB (nye VOL ue mab) is a monoclonal antibody. It treats certain types of cancer. Some of the cancers treated are colon cancer, head and neck cancer, Hodgkin lymphoma, lung cancer, and melanoma. This medicine may be used for other purposes; ask your health care   provider or pharmacist if you have questions. COMMON BRAND NAME(S): Opdivo What should I tell my care team before I take this medication? They need to know if you have any of these conditions: Autoimmune diseases such as Crohn's disease, ulcerative colitis, or lupus Have had or planning to have an allogeneic stem cell transplant (uses  someone else's stem cells) History of chest radiation Organ transplant Nervous system problems such as myasthenia gravis or Guillain-Barre syndrome An unusual or allergic reaction to nivolumab, other medicines, foods, dyes, or preservatives Pregnant or trying to get pregnant Breast-feeding How should I use this medication? This medication is injected into a vein. It is given in a hospital or clinic setting. A special MedGuide will be given to you before each treatment. Be sure to read this information carefully each time. Talk to your care team regarding the use of this medication in children. While it may be prescribed for children as young as 12 years for selected conditions, precautions do apply. Overdosage: If you think you have taken too much of this medicine contact a poison control center or emergency room at once. NOTE: This medicine is only for you. Do not share this medicine with others. What if I miss a dose? Keep appointments for follow-up doses. It is important not to miss your dose. Call your care team if you are unable to keep an appointment. What may interact with this medication? Interactions have not been studied. This list may not describe all possible interactions. Give your health care provider a list of all the medicines, herbs, non-prescription drugs, or dietary supplements you use. Also tell them if you smoke, drink alcohol, or use illegal drugs. Some items may interact with your medicine. What should I watch for while using this medication? Your condition will be monitored carefully while you are receiving this medication. You may need blood work done while you are taking this medication. Do not become pregnant while taking this medication or for 5 months after stopping it. Women should inform their care team if they wish to become pregnant or think they might be pregnant. There is a potential for serious harm to an unborn child. Talk to your care team for more  information. Do not breast-feed an infant while taking this medication or for 5 months after stopping it. What side effects may I notice from receiving this medication? Side effects that you should report to your care team as soon as possible: Allergic reactions--skin rash, itching, hives, swelling of the face, lips, tongue, or throat Bloody or black, tar-like stools Change in vision Chest pain Diarrhea Dry cough, shortness of breath or trouble breathing Eye pain Fast or irregular heartbeat Fever, chills High blood sugar (hyperglycemia)--increased thirst or amount of urine, unusual weakness or fatigue, blurry vision High thyroid levels (hyperthyroidism)--fast or irregular heartbeat, weight loss, excessive sweating or sensitivity to heat, tremors or shaking, anxiety, nervousness, irregular menstrual cycle or spotting Kidney injury--decrease in the amount of urine, swelling of the ankles, hands, or feet Liver injury--right upper belly pain, loss of appetite, nausea, light-colored stool, dark yellow or brown urine, yellowing skin or eyes, unusual weakness or fatigue Low red blood cell count--unusual weakness or fatigue, dizziness, headache, trouble breathing Low thyroid levels (hypothyroidism)--unusual weakness or fatigue, increased sensitivity to cold, constipation, hair loss, dry skin, weight gain, feelings of depression Mood and behavior changes-confusion, change in sex drive or performance, irritability Muscle pain or cramps Pain, tingling, or numbness in the hands or feet, muscle weakness, trouble walking, loss   of balance or coordination Red or dark brown urine Redness, blistering, peeling, or loosening of the skin, including inside the mouth Stomach pain Unusual bruising or bleeding Side effects that usually do not require medical attention (report to your care team if they continue or are bothersome): Bone pain Constipation Loss of appetite Nausea Tiredness Vomiting This list may  not describe all possible side effects. Call your doctor for medical advice about side effects. You may report side effects to FDA at 1-800-FDA-1088. Where should I keep my medication? This medication is given in a hospital or clinic and will not be stored at home. NOTE: This sheet is a summary. It may not cover all possible information. If you have questions about this medicine, talk to your doctor, pharmacist, or health care provider.  2023 Elsevier/Gold Standard (2021-03-09 00:00:00) Rehydration, Adult Rehydration is the replacement of body fluids, salts, and minerals (electrolytes) that are lost during dehydration. Dehydration is when there is not enough water or other fluids in the body. This happens when you lose more fluids than you take in. Common causes of dehydration include: Not drinking enough fluids. This can occur when you are ill or doing activities that require a lot of energy, especially in hot weather. Conditions that cause loss of water or other fluids, such as diarrhea, vomiting, sweating, or urinating a lot. Other illnesses, such as fever or infection. Certain medicines, such as those that remove excess fluid from the body (diuretics). Symptoms of mild or moderate dehydration may include thirst, dry lips and mouth, and dizziness. Symptoms of severe dehydration may include increased heart rate, confusion, fainting, and not urinating. For severe dehydration, you may need to get fluids through an IV at the hospital. For mild or moderate dehydration, you can usually rehydrate at home by drinking certain fluids as told by your health care provider. What are the risks? Generally, rehydration is safe. However, taking in too much fluid (overhydration) can be a problem. This is rare. Overhydration can cause an electrolyte imbalance, kidney failure, or a decrease in salt (sodium) levels in the body. Supplies needed You will need an oral rehydration solution (ORS) if your health care  provider tells you to use one. This is a drink to treat dehydration. It can be found in pharmacies and retail stores. How to rehydrate Fluids Follow instructions from your health care provider for rehydration. The kind of fluid and the amount you should drink depend on your condition. In general, you should choose drinks that you prefer. If told by your health care provider, drink an ORS. Make an ORS by following instructions on the package. Start by drinking small amounts, about  cup (120 mL) every 5-10 minutes. Slowly increase how much you drink until you have taken the amount recommended by your health care provider. Drink enough clear fluids to keep your urine pale yellow. If you were told to drink an ORS, finish it first, then start slowly drinking other clear fluids. Drink fluids such as: Water. This includes sparkling water and flavored water. Drinking only water can lead to having too little sodium in your body (hyponatremia). Follow the advice of your health care provider. Water from ice chips you suck on. Fruit juice with water you add to it (diluted). Sports drinks. Hot or cold herbal teas. Broth-based soups. Milk or milk products. Food Follow instructions from your health care provider about what to eat while you rehydrate. Your health care provider may recommend that you slowly begin eating regular foods in   small amounts. Eat foods that contain a healthy balance of electrolytes, such as bananas, oranges, potatoes, tomatoes, and spinach. Avoid foods that are greasy or contain a lot of sugar. In some cases, you may get nutrition through a feeding tube that is passed through your nose and into your stomach (nasogastric tube, or NG tube). This may be done if you have uncontrolled vomiting or diarrhea. Beverages to avoid  Certain beverages may make dehydration worse. While you rehydrate, avoid drinking alcohol. How to tell if you are recovering from dehydration You may be recovering  from dehydration if: You are urinating more often than before you started rehydrating. Your urine is pale yellow. Your energy level improves. You vomit less frequently. You have diarrhea less frequently. Your appetite improves or returns to normal. You feel less dizzy or less light-headed. Your skin tone and color start to look more normal. Follow these instructions at home: Take over-the-counter and prescription medicines only as told by your health care provider. Do not take sodium tablets. Doing this can lead to having too much sodium in your body (hypernatremia). Contact a health care provider if: You continue to have symptoms of mild or moderate dehydration, such as: Thirst. Dry lips. Slightly dry mouth. Dizziness. Dark urine or less urine than normal. Muscle cramps. You continue to vomit or have diarrhea. Get help right away if you: Have symptoms of dehydration that get worse. Have a fever. Have a severe headache. Have been vomiting and the following happens: Your vomiting gets worse or does not go away. Your vomit includes blood or green matter (bile). You cannot eat or drink without vomiting. Have problems with urination or bowel movements, such as: Diarrhea that gets worse or does not go away. Blood in your stool (feces). This may cause stool to look black and tarry. Not urinating, or urinating only a small amount of very dark urine, within 6-8 hours. Have trouble breathing. Have symptoms that get worse with treatment. These symptoms may represent a serious problem that is an emergency. Do not wait to see if the symptoms will go away. Get medical help right away. Call your local emergency services (911 in the [place] Do not drive yourself to the hospital. Summary Rehydration is the replacement of body fluids and minerals (electrolytes) that are lost during dehydration. Follow instructions from your health care provider for rehydration. The kind of fluid and amount you  should drink depend on your condition. Slowly increase how much you drink until you have taken the amount recommended by your health care provider. Contact your health care provider if you continue to show signs of mild or moderate dehydration. This information is not intended to replace advice given to you by your health care provider. Make sure you discuss any questions you have with your health care provider. Document Revised: 06/09/2019 Document Reviewed: 04/19/2019 Elsevier Patient Education  2023 [employer] 

## 2021-10-24 ENCOUNTER — Inpatient Hospital Stay: Payer: Medicare Other | Attending: Hematology | Admitting: Hematology

## 2021-10-24 ENCOUNTER — Other Ambulatory Visit: Payer: Self-pay

## 2021-10-24 ENCOUNTER — Inpatient Hospital Stay: Payer: Medicare Other

## 2021-10-24 VITALS — BP 140/65 | HR 73 | Temp 97.3°F | Resp 16 | Ht 59.0 in | Wt 120.5 lb

## 2021-10-24 DIAGNOSIS — E1151 Type 2 diabetes mellitus with diabetic peripheral angiopathy without gangrene: Secondary | ICD-10-CM | POA: Diagnosis not present

## 2021-10-24 DIAGNOSIS — R7989 Other specified abnormal findings of blood chemistry: Secondary | ICD-10-CM | POA: Diagnosis not present

## 2021-10-24 DIAGNOSIS — I2510 Atherosclerotic heart disease of native coronary artery without angina pectoris: Secondary | ICD-10-CM | POA: Insufficient documentation

## 2021-10-24 DIAGNOSIS — C7972 Secondary malignant neoplasm of left adrenal gland: Secondary | ICD-10-CM | POA: Diagnosis not present

## 2021-10-24 DIAGNOSIS — C787 Secondary malignant neoplasm of liver and intrahepatic bile duct: Secondary | ICD-10-CM | POA: Diagnosis not present

## 2021-10-24 DIAGNOSIS — K219 Gastro-esophageal reflux disease without esophagitis: Secondary | ICD-10-CM | POA: Diagnosis not present

## 2021-10-24 DIAGNOSIS — E039 Hypothyroidism, unspecified: Secondary | ICD-10-CM | POA: Diagnosis not present

## 2021-10-24 DIAGNOSIS — E1136 Type 2 diabetes mellitus with diabetic cataract: Secondary | ICD-10-CM | POA: Insufficient documentation

## 2021-10-24 DIAGNOSIS — E119 Type 2 diabetes mellitus without complications: Secondary | ICD-10-CM | POA: Insufficient documentation

## 2021-10-24 DIAGNOSIS — C642 Malignant neoplasm of left kidney, except renal pelvis: Secondary | ICD-10-CM | POA: Diagnosis not present

## 2021-10-24 DIAGNOSIS — I10 Essential (primary) hypertension: Secondary | ICD-10-CM | POA: Diagnosis not present

## 2021-10-24 DIAGNOSIS — C7951 Secondary malignant neoplasm of bone: Secondary | ICD-10-CM | POA: Diagnosis not present

## 2021-10-24 DIAGNOSIS — D649 Anemia, unspecified: Secondary | ICD-10-CM | POA: Diagnosis not present

## 2021-10-24 DIAGNOSIS — E785 Hyperlipidemia, unspecified: Secondary | ICD-10-CM | POA: Insufficient documentation

## 2021-10-24 DIAGNOSIS — R748 Abnormal levels of other serum enzymes: Secondary | ICD-10-CM | POA: Diagnosis not present

## 2021-10-24 DIAGNOSIS — Z5112 Encounter for antineoplastic immunotherapy: Secondary | ICD-10-CM | POA: Insufficient documentation

## 2021-10-24 DIAGNOSIS — R5383 Other fatigue: Secondary | ICD-10-CM | POA: Diagnosis not present

## 2021-10-24 DIAGNOSIS — K8020 Calculus of gallbladder without cholecystitis without obstruction: Secondary | ICD-10-CM | POA: Diagnosis not present

## 2021-10-24 DIAGNOSIS — Z86010 Personal history of colonic polyps: Secondary | ICD-10-CM | POA: Diagnosis not present

## 2021-10-24 DIAGNOSIS — Z95828 Presence of other vascular implants and grafts: Secondary | ICD-10-CM

## 2021-10-24 DIAGNOSIS — Z7189 Other specified counseling: Secondary | ICD-10-CM

## 2021-10-24 DIAGNOSIS — C649 Malignant neoplasm of unspecified kidney, except renal pelvis: Secondary | ICD-10-CM | POA: Diagnosis not present

## 2021-10-24 DIAGNOSIS — K449 Diaphragmatic hernia without obstruction or gangrene: Secondary | ICD-10-CM | POA: Diagnosis not present

## 2021-10-24 DIAGNOSIS — I252 Old myocardial infarction: Secondary | ICD-10-CM | POA: Diagnosis not present

## 2021-10-24 DIAGNOSIS — K5730 Diverticulosis of large intestine without perforation or abscess without bleeding: Secondary | ICD-10-CM | POA: Insufficient documentation

## 2021-10-24 DIAGNOSIS — I700 Atherosclerosis of aorta: Secondary | ICD-10-CM | POA: Insufficient documentation

## 2021-10-24 LAB — CMP (CANCER CENTER ONLY)
ALT: 37 U/L (ref 0–44)
AST: 28 U/L (ref 15–41)
Albumin: 4.1 g/dL (ref 3.5–5.0)
Alkaline Phosphatase: 60 U/L (ref 38–126)
Anion gap: 6 (ref 5–15)
BUN: 39 mg/dL — ABNORMAL HIGH (ref 8–23)
CO2: 19 mmol/L — ABNORMAL LOW (ref 22–32)
Calcium: 9.1 mg/dL (ref 8.9–10.3)
Chloride: 109 mmol/L (ref 98–111)
Creatinine: 1.82 mg/dL — ABNORMAL HIGH (ref 0.44–1.00)
GFR, Estimated: 28 mL/min — ABNORMAL LOW (ref >60)
Glucose, Bld: 125 mg/dL — ABNORMAL HIGH (ref 70–99)
Potassium: 4.5 mmol/L (ref 3.5–5.1)
Sodium: 134 mmol/L — ABNORMAL LOW (ref 135–145)
Total Bilirubin: 0.4 mg/dL (ref 0.3–1.2)
Total Protein: 6.8 g/dL (ref 6.5–8.1)

## 2021-10-24 LAB — CBC WITH DIFFERENTIAL (CANCER CENTER ONLY)
Abs Immature Granulocytes: 0.19 10*3/uL — ABNORMAL HIGH (ref 0.00–0.07)
Basophils Absolute: 0.1 10*3/uL (ref 0.0–0.1)
Basophils Relative: 0 %
Eosinophils Absolute: 0.1 10*3/uL (ref 0.0–0.5)
Eosinophils Relative: 1 %
HCT: 39.5 % (ref 36.0–46.0)
Hemoglobin: 12.7 g/dL (ref 12.0–15.0)
Immature Granulocytes: 2 %
Lymphocytes Relative: 12 %
Lymphs Abs: 1.3 10*3/uL (ref 0.7–4.0)
MCH: 27.5 pg (ref 26.0–34.0)
MCHC: 32.2 g/dL (ref 30.0–36.0)
MCV: 85.5 fL (ref 80.0–100.0)
Monocytes Absolute: 1.2 10*3/uL — ABNORMAL HIGH (ref 0.1–1.0)
Monocytes Relative: 10 %
Neutro Abs: 8.6 10*3/uL — ABNORMAL HIGH (ref 1.7–7.7)
Neutrophils Relative %: 75 %
Platelet Count: 306 10*3/uL (ref 150–400)
RBC: 4.62 MIL/uL (ref 3.87–5.11)
RDW: 19.9 % — ABNORMAL HIGH (ref 11.5–15.5)
WBC Count: 11.4 10*3/uL — ABNORMAL HIGH (ref 4.0–10.5)
nRBC: 0 % (ref 0.0–0.2)

## 2021-10-24 MED ORDER — LIDOCAINE-PRILOCAINE 2.5-2.5 % EX CREA: 1.0000 | TOPICAL_CREAM | Freq: Every day | CUTANEOUS | 1 refills | Status: DC | PRN

## 2021-10-24 MED ORDER — SODIUM CHLORIDE 0.9 % IV SOLN: INTRAVENOUS | Status: AC

## 2021-10-24 MED ORDER — SODIUM CHLORIDE 0.9% FLUSH
10.0000 mL | INTRAVENOUS | Status: DC | PRN
  Administered 2021-10-24: 10 mL

## 2021-10-24 MED ORDER — SODIUM CHLORIDE 0.9% FLUSH
10.0000 mL | Freq: Once | INTRAVENOUS | Status: AC
  Administered 2021-10-24: 10 mL

## 2021-10-24 MED ORDER — HEPARIN SOD (PORK) LOCK FLUSH 100 UNIT/ML IV SOLN
500.0000 [IU] | Freq: Once | INTRAVENOUS | Status: AC | PRN
  Administered 2021-10-24: 500 [IU]

## 2021-10-24 MED ORDER — SODIUM CHLORIDE 0.9 % IV SOLN
240.0000 mg | Freq: Once | INTRAVENOUS | Status: AC
  Administered 2021-10-24: 240 mg via INTRAVENOUS
  Filled 2021-10-24: qty 24

## 2021-10-24 NOTE — Progress Notes (Signed)
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[place])   01/02/2018 -  Chemotherapy   Patient is on Treatment Plan : RENAL CELL CARCINOMA NIVOLUMAB Q14D     Renal cell carcinoma ([place])  04/11/2017 Initial Diagnosis   Renal cell carcinoma ([place])   01/02/2018 -  Chemotherapy   The patient had nivolumab (OPDIVO) 240 mg in sodium chloride 0.9 % 100 mL chemo infusion, 240 mg, Intravenous, Once, 47 of 50 cycles Administration: 240 mg (01/02/2018), 240 mg (01/15/2018), 240 mg (01/30/2018), 240 mg (02/13/2018), 240 mg (02/27/2018), 240 mg (03/13/2018), 240 mg (03/27/2018), 240 mg (04/10/2018), 240 mg (04/24/2018), 240 mg (05/08/2018), 240 mg (05/22/2018), 240 mg (06/05/2018), 240 mg (06/19/2018), 240 mg (08/28/2018), 240 mg (09/11/2018), 240 mg (10/09/2018), 240 mg (10/22/2018), 240 mg (11/06/2018), 240 mg (11/20/2018), 240 mg (12/04/2018), 240 mg (12/18/2018), 240 mg (01/01/2019), 240 mg (01/15/2019), 240 mg (01/29/2019), 240 mg (02/12/2019), 240 mg (02/26/2019), 240 mg (03/12/2019), 240 mg (03/26/2019), 240 mg (04/09/2019), 240 mg (04/28/2019), 240 mg (05/12/2019), 240 mg (05/27/2019), 240 mg (06/09/2019), 240 mg (06/23/2019), 240 mg (07/07/2019), 240 mg (07/21/2019), 240 mg (08/04/2019), 240 mg (08/18/2019), 240 mg (09/03/2019), 240 mg (09/15/2019), 240 mg (09/29/2019), 240 mg (10/12/2019), 240 mg (10/27/2019), 240 mg (11/10/2019), 240 mg (12/08/2019), 240 mg (12/22/2019), 240 mg (01/19/2020)  for chemotherapy treatment.      CURRENT THERAPY: metastatic renal cell carcinoma, Delton See and Nivolumab  INTERVAL HISTORY:  Audrey [name] 81 y.o. [name] is here for continued  evaluation and management of her metastatic renal cell carcinoma on maintenance nivolumab immunotherapy. She reports She is doing well with no new symptoms or concerns.  She reports persistent grade 1 fatigue.  She notes bodily itching.  She notes no acute new symptoms suggestive of cancer progression.    Good p.o. intake.  No fever, chills, night sweats. No new lumps, bumps, or lesions/rashes. No diarrhea. No other new or acute focal symptoms.  No notable side effects from her previous dose of nivolumab at this time.  Labs done today were discussed with the patient.  Patient Active Problem List   Diagnosis Date Noted   Type 2 diabetes mellitus ([place]) 08/15/2021   Incisional hernia 06/23/2020   Gastritis and gastroduodenitis 02/26/2020   Ventral hernia without obstruction or gangrene 02/26/2020   Rectal discomfort 02/26/2020   History of colonic polyps 02/26/2020   History of cholecystectomy 07/17/2019   Anemia 10/10/2018   Chronic cholecystitis 09/04/2018   Acute cholecystitis 08/29/2018   Constipation 08/23/2018   Abdominal pain 08/23/2018   Biliary stricture 08/23/2018   Preop cardiovascular exam 08/14/2018   Coronary artery disease involving native coronary artery of native heart without angina pectoris 08/14/2018   Dyslipidemia 08/14/2018   Abnormal LFTs 08/05/2018   Choledocholithiasis 08/05/2018   Dilated bile duct 08/05/2018   History of ERCP 08/05/2018   History of renal cell carcinoma 08/05/2018   Port-A-Cath in place 01/30/2018   Counseling regarding advance care planning and goals of care 01/04/2018   Renal cell carcinoma ([place]) 04/11/2017   Cancer, 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  MEDICAL HISTORY: Past Medical History:  Diagnosis Date   Anemia    Arthritis    lower back, hips, hands   Biliary stricture    Diabetes mellitus  ([place])    type 2    Early cataracts, bilateral    Md just watching   Elevated liver enzymes    Family history of adverse reaction to anesthesia    Daughter hard to wake up   Gallstones    GERD (gastroesophageal reflux disease)    occasional - diet controlled   History of blood transfusion 2018   History of hiatal hernia    HTN (hypertension)    Hyperlipidemia    Hypothyroidism    left renal ca dx'd 2018   renal cancer - left kidney removed, pill chemo x 1 yr   Myocardial infarction ([place]) 1991   no deficits   SVD (spontaneous vaginal delivery)    x 3   Wears glasses     SURGICAL HISTORY: Past Surgical History:  Procedure Laterality Date   BALLOON DILATION N/A 07/23/2018   Procedure: BALLOON DILATION;  Surgeon: Mansouraty, Telford Nab., MD;  Location: [facility];  Service: Gastroenterology;  Laterality: N/A;   BILIARY BRUSHING  08/10/2018   Procedure: BILIARY BRUSHING;  Surgeon: Rush Landmark Telford Nab., MD;  Location: [facility];  Service: Gastroenterology;;   BILIARY BRUSHING  11/30/2018   Procedure: BILIARY BRUSHING;  Surgeon: Irving Copas., MD;  Location: [place];  Service: Gastroenterology;;   BILIARY DILATION  08/10/2018   Procedure: BILIARY DILATION;  Surgeon: Irving Copas., MD;  Location: [place];  Service: Gastroenterology;;   BILIARY DILATION  08/27/2018   Procedure: BILIARY DILATION;  Surgeon: Irving Copas., MD;  Location: [place];  Service: Gastroenterology;;   BILIARY DILATION  01/24/2020   Procedure: BILIARY DILATION;  Surgeon: Irving Copas., MD;  Location: [place];  Service: Gastroenterology;;   BILIARY STENT PLACEMENT  08/10/2018   Procedure: BILIARY STENT PLACEMENT;  Surgeon: Irving Copas., MD;  Location: [place];  Service: Gastroenterology;;   BILIARY STENT PLACEMENT  08/27/2018   Procedure: BILIARY STENT PLACEMENT;  Surgeon: Irving Copas., MD;  Location: [place];  Service:  Gastroenterology;;   BILIARY STENT PLACEMENT  11/30/2018   Procedure: BILIARY STENT PLACEMENT;  Surgeon: Irving Copas., MD;  Location: [place];  Service: Gastroenterology;;   BIOPSY  07/23/2018   Procedure: BIOPSY;  Surgeon: Irving Copas., MD;  Location: [place];  Service: Gastroenterology;;   BIOPSY  01/24/2020   Procedure: BIOPSY;  Surgeon: Irving Copas., MD;  Location: [place];  Service: Gastroenterology;;   CHOLECYSTECTOMY N/A 09/02/2018   Procedure: LAPAROSCOPIC CHOLECYSTECTOMY;  Surgeon: Stark Klein, MD;  Location: [place];  Service: General;  Laterality: N/A;   COLONOSCOPY     normal    ENDOSCOPIC RETROGRADE CHOLANGIOPANCREATOGRAPHY (ERCP) WITH PROPOFOL N/A 08/10/2018   Procedure: ENDOSCOPIC RETROGRADE CHOLANGIOPANCREATOGRAPHY (ERCP) WITH PROPOFOL;  Surgeon: Irving Copas., MD;  Location: [place];  Service: Gastroenterology;  Laterality: N/A;   ENDOSCOPIC RETROGRADE CHOLANGIOPANCREATOGRAPHY (ERCP) WITH PROPOFOL N/A 11/30/2018   Procedure: ENDOSCOPIC RETROGRADE CHOLANGIOPANCREATOGRAPHY (ERCP) WITH PROPOFOL;  Surgeon: Rush Landmark Telford Nab., MD;  Location: [place];  Service: Gastroenterology;  Laterality: N/A;   ENDOSCOPIC RETROGRADE CHOLANGIOPANCREATOGRAPHY (ERCP) WITH PROPOFOL N/A 01/24/2020   Procedure: ENDOSCOPIC RETROGRADE CHOLANGIOPANCREATOGRAPHY (ERCP) WITH PROPOFOL;  Surgeon: Rush Landmark Telford Nab., MD;  Location: [place];  Service: Gastroenterology;  Laterality: N/A;   ERCP N/A 07/23/2018   Procedure: ENDOSCOPIC  RETROGRADE CHOLANGIOPANCREATOGRAPHY (ERCP);  Surgeon: Irving Copas., MD;  Location: [place];  Service: Gastroenterology;  Laterality: N/A;   ERCP N/A 08/27/2018   Procedure: ENDOSCOPIC RETROGRADE CHOLANGIOPANCREATOGRAPHY (ERCP);  Surgeon: Irving Copas., MD;  Location: [facility];  Service: Gastroenterology;  Laterality: N/A;   ESOPHAGOGASTRODUODENOSCOPY N/A 01/24/2020   Procedure:  ESOPHAGOGASTRODUODENOSCOPY (EGD);  Surgeon: Irving Copas., MD;  Location: [place];  Service: Gastroenterology;  Laterality: N/A;   ESOPHAGOGASTRODUODENOSCOPY (EGD) WITH PROPOFOL N/A 08/27/2018   Procedure: ESOPHAGOGASTRODUODENOSCOPY (EGD) WITH PROPOFOL;  Surgeon: Rush Landmark Telford Nab., MD;  Location: [place];  Service: Gastroenterology;  Laterality: N/A;   EUS N/A 08/27/2018   Procedure: ESOPHAGEAL ENDOSCOPIC ULTRASOUND (EUS) RADIAL;  Surgeon: Rush Landmark Telford Nab., MD;  Location: [place];  Service: Gastroenterology;  Laterality: N/A;   FINE NEEDLE ASPIRATION  08/27/2018   Procedure: FINE NEEDLE ASPIRATION (FNA) LINEAR;  Surgeon: Irving Copas., MD;  Location: [place];  Service: Gastroenterology;;   Fatima Blank HERNIA REPAIR N/A 06/23/2020   Procedure: [place];  Surgeon: Felicie Morn, MD;  Location: [facility];  Service: General;  Laterality: N/A;  ROOM 2 STARTING AT 11:00AM FOR 90 MIN   IR IMAGING GUIDED PORT INSERTION  01/08/2018   LAPAROSCOPIC NEPHRECTOMY Left 08/01/2016   Procedure: LAPAROSCOPIC  RADICAL NEPHRECTOMY/ REPAIR OF UMBILICAL HERNIA;  Surgeon: Raynelle Bring, MD;  Location: [facility];  Service: Urology;  Laterality: Left;   REMOVAL OF STONES  07/23/2018   Procedure: REMOVAL OF GALL STONES;  Surgeon: Rush Landmark Telford Nab., MD;  Location: [place];  Service: Gastroenterology;;   REMOVAL OF STONES  08/10/2018   Procedure: REMOVAL OF STONES;  Surgeon: Irving Copas., MD;  Location: [place];  Service: Gastroenterology;;   REMOVAL OF STONES  08/27/2018   Procedure: REMOVAL OF STONES;  Surgeon: Irving Copas., MD;  Location: [place];  Service: Gastroenterology;;   REMOVAL OF STONES  01/24/2020   Procedure: REMOVAL OF STONES;  Surgeon: Irving Copas., MD;  Location: [place];  Service: Gastroenterology;;   [name]  07/23/2018   Procedure: [name];  Surgeon: Irving Copas., MD;  Location: [place];  Service: Gastroenterology;;   Lavell Islam REMOVAL  08/27/2018   Procedure: STENT REMOVAL;  Surgeon: Irving Copas., MD;  Location: [place];  Service: Gastroenterology;;   Lavell Islam REMOVAL  11/30/2018   Procedure: STENT REMOVAL;  Surgeon: Irving Copas., MD;  Location: [place];  Service: Gastroenterology;;   STENT REMOVAL  01/24/2020   Procedure: STENT REMOVAL;  Surgeon: Irving Copas., MD;  Location: [facility];  Service: Gastroenterology;;   UPPER GI ENDOSCOPY     x 1   VAGINAL PROLAPSE REPAIR  11/18/2019   [facility]    SOCIAL HISTORY: Social History   Socioeconomic History   Marital status: Married    Spouse name: Not on file   Number of children: 3   Years of education: Not on file   Highest education level: Not on file  Occupational History   Occupation: retired  Tobacco Use   Smoking status: Former    Packs/day: 1.00    Years: 8.00    Total pack years: 8.00    Types: Cigarettes    Quit date: 06/18/1964    Years since quitting: 57.3   Smokeless tobacco: Never  Vaping Use   Vaping Use: Never used  Substance and Sexual Activity   Alcohol use: No   Drug use: No   Sexual activity: Not Currently  Birth control/protection: Post-menopausal  Other Topics Concern   Not on file  Social History Narrative   Married.  Three children   Social Determinants of Health   Financial Resource Strain: Not on file  Food Insecurity: Not on file  Transportation Needs: Not on file  Physical Activity: Not on file  Stress: Not on file  Social Connections: Not on file  Intimate Partner Violence: Not on file    FAMILY HISTORY: Family History  Problem Relation Age of Onset   Heart attack Father 74   Heart disease Brother 47       CABG   Colon cancer Neg Hx    Esophageal cancer Neg Hx    Inflammatory bowel disease Neg Hx    Liver disease Neg Hx    Pancreatic cancer Neg Hx    Rectal cancer Neg Hx    Stomach  cancer Neg Hx      ROS 10 Point review of Systems was done is negative except as noted above.  PHYSICAL EXAMINATION .LMP  (LMP Unknown)  Vitals:   10/24/21 1212  BP: 140/65  Pulse: 73  Resp: 16  Temp: (!) 97.3 F (36.3 C)  SpO2: 99%   NAD GENERAL:alert, in no acute distress and comfortable SKIN: no acute rashes, no significant lesions EYES: conjunctiva are pink and non-injected, sclera anicteric NECK: supple, no JVD LYMPH:  no palpable lymphadenopathy in the cervical, axillary or inguinal regions LUNGS: clear to auscultation b/l with normal respiratory effort HEART: regular rate & rhythm ABDOMEN:  normoactive bowel sounds , non tender, not distended. Extremity: no pedal edema PSYCH: alert & oriented x 3 with fluent speech NEURO: no focal motor/sensory deficits  Exam performed in chair.  LABORATORY DATA: .    Latest Ref Rng & Units 10/24/2021   11:55 AM 10/10/2021    8:50 AM 09/26/2021    8:18 AM  CBC  WBC 4.0 - 10.5 K/uL 11.4  10.9  8.8   Hemoglobin 12.0 - 15.0 g/dL 12.7  11.2  10.7   Hematocrit 36.0 - 46.0 % 39.5  36.1  34.7   Platelets 150 - 400 K/uL 306  274  324    .    Latest Ref Rng & Units 10/24/2021   11:55 AM 10/10/2021    8:50 AM 09/26/2021    8:18 AM  CMP  Glucose 70 - 99 mg/dL 125  116  91   BUN 8 - 23 mg/dL 39  28  28   Creatinine 0.44 - 1.00 mg/dL 1.82  1.66  1.76   Sodium 135 - 145 mmol/L 134  137  138   Potassium 3.5 - 5.1 mmol/L 4.5  4.4  3.7   Chloride 98 - 111 mmol/L 109  109  112   CO2 22 - 32 mmol/L '19  23  20   '$ Calcium 8.9 - 10.3 mg/dL 9.1  9.0  9.0   Total Protein 6.5 - 8.1 g/dL 6.8  6.2  6.2   Total Bilirubin 0.3 - 1.2 mg/dL 0.4  0.3  0.3   Alkaline Phos 38 - 126 U/L 60  55  52   AST 15 - 41 U/L '28  20  19   '$ ALT 0 - 44 U/L 37  26  18    RADIOLOGY    CLINICAL DATA:  Subsequent treatment strategy for metastatic renal cell carcinoma.   EXAM: NUCLEAR MEDICINE PET SKULL BASE TO THIGH   TECHNIQUE: 6.32 mCi F-18 FDG was injected  intravenously. Full-ring  PET imaging was performed from the skull base to thigh after the radiotracer. CT data was obtained and used for attenuation correction and anatomic localization.   Fasting blood glucose: 159 mg/dl   COMPARISON:  PET-CT 08/29/2020 and 02/09/2020.   FINDINGS: Mediastinal blood pool activity: SUV max 2.7   NECK:   No hypermetabolic cervical lymph nodes are identified.There are no lesions of the pharyngeal mucosal space. Activity associated with the muscles of phonation is within physiologic limits and similar to previous study.   Incidental CT findings: Bilateral carotid atherosclerosis.   CHEST:   There are no hypermetabolic mediastinal, hilar or axillary lymph nodes. No hypermetabolic pulmonary activity or suspicious nodularity.   Incidental CT findings: Right IJ Port-A-Cath extends to the superior cavoatrial junction. There is diffuse atherosclerosis of the aorta, great vessels and coronary arteries. Mild linear scarring at both lung bases appears unchanged.   ABDOMEN/PELVIS:   There is no hypermetabolic activity within the liver, adrenal glands, spleen or pancreas. There is no hypermetabolic nodal activity. Stable appearance of the right kidney. No hypermetabolic activity within the left nephrectomy bed   Incidental CT findings: Diffuse aortic and branch vessel atherosclerosis, scattered colonic diverticulosis and pelvic floor laxity are again noted.   SKELETON:   There is no hypermetabolic activity to suggest osseous metastatic disease.   Incidental CT findings: Grossly stable sclerotic lesions within the thoracic spine, without hypermetabolic activity. Lower lumbar spondylosis and sacroiliac degenerative changes bilaterally.   IMPRESSION: 1. Stable PET-CT status post left nephrectomy. No evidence of local recurrence or hypermetabolic metastatic disease. 2. Stable incidental findings including coronary and Aortic Atherosclerosis  (ICD10-I70.0). Pelvic floor laxity.     Electronically Signed   By: Richardean Sale M.D.   On: 04/03/2021 12:30      ASSESSMENT and PLAN:   81 y.o. 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 PET/CT 06/19/2017 -- consistent with partial metabolic response to treatment.   Rt adrenal gland bx - showed clear cell RCC   S/p CYtoreductive left radical nephrectomy and left adrenal gland resection on 08/01/2016 by Dr Alinda Money.   10/16/17 PET/CT revealed Continued improvement, with the T7 metastatic lesion no longer significantly hypermetabolic. The previous right lower lobe pulmonary nodule is even less apparent, perhaps about 2 mm in diameter today; given that this measured 6 mm on 03/28/2017 this probably represents an effectively treated metastatic lesion. Currently no appreciable hypermetabolic activity is identified to suggest active malignancy. Distended gallbladder with gallbladder wall thickening and gallstones. Correlate clinically in assessing for cholecystitis. Small but abnormal amount of free pelvic fluid, nonspecific. Other imaging findings of potential clinical significance: Chronic ethmoid sinusitis. Aortic Atherosclerosis. Stable 5 mm right middle lobe pulmonary nodule, not hypermetabolic but below sensitive PET-CT size thresholds. Left nephrectomy. Notable pelvic floor laxity with cystocele. Chronic bilateral Sacroiliitis.   04/03/18 PET/CT revealed No evidence for new or progressive hypermetabolic disease on today's study to suggest metastatic progression. 2. Stable appearance of the T7 metastatic lesion without Hypermetabolism. 3. Tiny focus of FDG accumulation identified along the skin of the low right inguinal fold. No associated lesion evident on CT. This may be related to urinary contaminant. 4. Cholelithiasis with similar appearance of diffuse  gallbladder wall thickening. 5. Stable 5 mm right middle lobe pulmonary nodule. 6.  Aortic Atherosclerois. 7. Diffuse colonic diverticulosis.    09/18/18 CT A/P revealed "Common duct stent in  place. Improvement to resolution in biliary duct dilatation compared to 08/29/2018. Interval cholecystectomy without acute complication. 2. Left nephrectomy, without evidence of metastatic disease. 3.  Tiny hiatal hernia. 4. Coronary artery atherosclerosis. Aortic Atherosclerosis. 5. Mild limitations secondary to lack of IV contrast. 6. Marked pelvic floor laxity with cystocele and rectal prolapse".   07/02/2019 PET/CT ([id number]) which revealed "1. 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   #3 Small pulmonary lesions -- 10/16/17 PET/CT showed pulmonary less apparent than 03/28/17 PET/CT, decreasing from 29m to 22mdiameter.   MRI brain shows no evidence of metastatic disease   #4 T7/8 Bone metastases - received Xgeva every 4 weeks from May 2018 to June 2019. 10/16/17 PET/CT revealed improvement with T7 metastatic lesion no longer significantly hypermetabolic.  -on XgMarchelle Folks #5 ?liver mets- rpt PET/CT from 10/16/2017 shows no overt evidence of metastatic disease in the liver.   #6 Cchronic renal insufficiency Creatine stable   #7 Hemorrhoids -chronic with some bleeding --Recommended Sitz bath and OTC Anusol or Nupercaine for her hemorrhoid relief. -f/u with PCP for continued mx   #8 Hypothyroidism/Adrenal insufficiency/Diabetes -Continue being followed by Dr. KeBuddy Duty #9 h/o HTN - ?control. Patient tends to be anxious and has higher blood pressures in the clinic.   #10 CAD - positive cardiac nuclear stress test Following with cardiology and nephrology    #11 h/o Choledocholithiasis and cholelithiasis 07/10/18 USKoreabdomen revealed Biliary duct dilatation with 8 mm calculus at the level  of the ampulla in the distal common bile duct. 2. Cholelithiasis. No gallbladder wall thickening or pericholecystic fluid. 3. Appearance of the liver raises concern for underlying hepatic cirrhosis. No focal liver lesions are demonstrable. 4.  Left kidney absent. 5.  Small cysts in right kidney. 6.  Aortic Atherosclerosis.   S/p ERCP on 07/23/18 with Dr. GaValarie Merinoansouraty   09/02/18 Cytopathology from ERCP was suspicious for malignant cells in bile duct, but was not definitive, other two findings were benign.   # 12 Peripheral arterial disease -- following with cardiology/vascular surgery  PLAN:   -Patient's labs from today were discussed in detail with her. -CBC shows improved anemia following IV Venofer with normal hemoglobin of 12.7 and platelets. WBC count slightly elevated at 11.4k.  -Ferritin is 8 with an iron saturation of 4% -We discussed and the patient notes that she is probably developing iron deficiency because of ongoing hemorrhoidal bleeding. -We discussed and patient is agreeable to proceed with additional IV iron replacement.  We will do Venofer 300 mg q 2 weekly x2 doses -Patient has no prohibitive toxicities from her nivolumab. -We shall continue her nivolumab every 2 weeks with labs at this time. -Orders for nivolumab were reviewed and signed.  We will continue same supportive medications. She will continue to follow with her PCP and other physicians for management of her diabetes, PAD and other comorbidities.  FOLLOW UP: -Continue nivolumab with port flush and labs every 2 weeks x 8 doses PET/CT in 2 weeks Return to clinic with Dr. KaIrene Limbon 6 weeks with her scheduled treatment  The total time spent in the appointment was 30 minutes*.  All of the patient's questions were answered with apparent satisfaction. The patient knows to call the clinic with any problems, questions or concerns.   GaSullivan LoneD MS AAHIVMS SCLayton HospitalTPremier Specialty Surgical Center LLCematology/Oncology Physician CoMemorial Care Surgical [facility].*Total Encounter  Time as defined by the Centers for Medicare and Medicaid Services includes, in addition to the face-to-face time of a patient visit (documented in the note above) non-face-to-face time: obtaining and reviewing outside history, ordering and reviewing medications, tests or procedures, care coordination (communications with other health care professionals or caregivers) and documentation in the medical record.  I, Melene Muller, am acting as scribe for Dr. Sullivan Lone, MD.  .I have reviewed the above documentation for accuracy and completeness, and I agree with the above. Brunetta Genera MD

## 2021-10-24 NOTE — Patient Instructions (Signed)
[name]  Discharge Instructions: Thank you for choosing [facility] to provide your oncology and hematology care.   If you have a lab appointment with the [facility], please go directly to the [facility] and check in at the registration area.   Wear comfortable clothing and clothing appropriate for easy access to any Portacath or PICC line.   We strive to give you quality time with your provider. You may need to reschedule your appointment if you arrive late (15 or more minutes).  Arriving late affects you and other patients whose appointments are after yours.  Also, if you miss three or more appointments without notifying the office, you may be dismissed from the clinic at the provider's discretion.      For prescription refill requests, have your pharmacy contact our office and allow 72 hours for refills to be completed.    Today you received the following chemotherapy and/or immunotherapy agents: Opdivo.      To help prevent nausea and vomiting after your treatment, we encourage you to take your nausea medication as directed.  BELOW ARE SYMPTOMS THAT SHOULD BE REPORTED IMMEDIATELY: *FEVER GREATER THAN 100.4 F (38 C) OR HIGHER *CHILLS OR SWEATING *NAUSEA AND VOMITING THAT IS NOT CONTROLLED WITH YOUR NAUSEA MEDICATION *UNUSUAL SHORTNESS OF BREATH *UNUSUAL BRUISING OR BLEEDING *URINARY PROBLEMS (pain or burning when urinating, or frequent urination) *BOWEL PROBLEMS (unusual diarrhea, constipation, pain near the anus) TENDERNESS IN MOUTH AND THROAT WITH OR WITHOUT PRESENCE OF ULCERS (sore throat, sores in mouth, or a toothache) UNUSUAL RASH, SWELLING OR PAIN  UNUSUAL VAGINAL DISCHARGE OR ITCHING   Items with * indicate a potential emergency and should be followed up as soon as possible or go to the Emergency Department if any problems should occur.  Please show the CHEMOTHERAPY ALERT CARD or IMMUNOTHERAPY ALERT CARD at check-in to  the Emergency Department and triage nurse.  Should you have questions after your visit or need to cancel or reschedule your appointment, please contact [facility]  Dept: [phone]  and follow the prompts.  Office hours are 8:00 a.m. to 4:30 p.m. Monday - Friday. Please note that voicemails left after 4:00 p.m. may not be returned until the following business day.  We are closed weekends and major holidays. You have access to a nurse at all times for urgent questions. Please call the main number to the clinic Dept: [phone] and follow the prompts.   For any non-urgent questions, you may also contact your provider using MyChart. We now offer e-Visits for anyone 57 and older to request care online for non-urgent symptoms. For details visit [email or web address].   Also download the MyChart app! Go to the app store, search "MyChart", open the app, select [facility], and log in with your MyChart username and password.  Masks are optional in the cancer centers. If you would like for your care team to wear a mask while they are taking care of you, please let them know. For doctor visits, patients may have with them one support Barbi Kumagai who is at least 80 years old. At this time, visitors are not allowed in the infusion area. Nivolumab injection What is this medication? NIVOLUMAB (nye VOL ue mab) is a monoclonal antibody. It treats certain types of cancer. Some of the cancers treated are colon cancer, head and neck cancer, Hodgkin lymphoma, lung cancer, and melanoma. This medicine may be used for other purposes; ask your health care  provider or pharmacist if you have questions. COMMON BRAND NAME(S): Opdivo What should I tell my care team before I take this medication? They need to know if you have any of these conditions: Autoimmune diseases such as Crohn's disease, ulcerative colitis, or lupus Have had or planning to have an allogeneic stem cell transplant (uses  someone else's stem cells) History of chest radiation Organ transplant Nervous system problems such as myasthenia gravis or Guillain-Barre syndrome An unusual or allergic reaction to nivolumab, other medicines, foods, dyes, or preservatives Pregnant or trying to get pregnant Breast-feeding How should I use this medication? This medication is injected into a vein. It is given in a hospital or clinic setting. A special MedGuide will be given to you before each treatment. Be sure to read this information carefully each time. Talk to your care team regarding the use of this medication in children. While it may be prescribed for children as young as 12 years for selected conditions, precautions do apply. Overdosage: If you think you have taken too much of this medicine contact a poison control center or emergency room at once. NOTE: This medicine is only for you. Do not share this medicine with others. What if I miss a dose? Keep appointments for follow-up doses. It is important not to miss your dose. Call your care team if you are unable to keep an appointment. What may interact with this medication? Interactions have not been studied. This list may not describe all possible interactions. Give your health care provider a list of all the medicines, herbs, non-prescription drugs, or dietary supplements you use. Also tell them if you smoke, drink alcohol, or use illegal drugs. Some items may interact with your medicine. What should I watch for while using this medication? Your condition will be monitored carefully while you are receiving this medication. You may need blood work done while you are taking this medication. Do not become pregnant while taking this medication or for 5 months after stopping it. Women should inform their care team if they wish to become pregnant or think they might be pregnant. There is a potential for serious harm to an unborn child. Talk to your care team for more  information. Do not breast-feed an infant while taking this medication or for 5 months after stopping it. What side effects may I notice from receiving this medication? Side effects that you should report to your care team as soon as possible: Allergic reactions--skin rash, itching, hives, swelling of the face, lips, tongue, or throat Bloody or black, tar-like stools Change in vision Chest pain Diarrhea Dry cough, shortness of breath or trouble breathing Eye pain Fast or irregular heartbeat Fever, chills High blood sugar (hyperglycemia)--increased thirst or amount of urine, unusual weakness or fatigue, blurry vision High thyroid levels (hyperthyroidism)--fast or irregular heartbeat, weight loss, excessive sweating or sensitivity to heat, tremors or shaking, anxiety, nervousness, irregular menstrual cycle or spotting Kidney injury--decrease in the amount of urine, swelling of the ankles, hands, or feet Liver injury--right upper belly pain, loss of appetite, nausea, light-colored stool, dark yellow or brown urine, yellowing skin or eyes, unusual weakness or fatigue Low red blood cell count--unusual weakness or fatigue, dizziness, headache, trouble breathing Low thyroid levels (hypothyroidism)--unusual weakness or fatigue, increased sensitivity to cold, constipation, hair loss, dry skin, weight gain, feelings of depression Mood and behavior changes-confusion, change in sex drive or performance, irritability Muscle pain or cramps Pain, tingling, or numbness in the hands or feet, muscle weakness, trouble walking, loss  of balance or coordination Red or dark brown urine Redness, blistering, peeling, or loosening of the skin, including inside the mouth Stomach pain Unusual bruising or bleeding Side effects that usually do not require medical attention (report to your care team if they continue or are bothersome): Bone pain Constipation Loss of appetite Nausea Tiredness Vomiting This list may  not describe all possible side effects. Call your doctor for medical advice about side effects. You may report side effects to FDA at 1-800-FDA-1088. Where should I keep my medication? This medication is given in a hospital or clinic and will not be stored at home. NOTE: This sheet is a summary. It may not cover all possible information. If you have questions about this medicine, talk to your doctor, pharmacist, or health care provider.  2023 Elsevier/Gold Standard (2021-03-09 00:00:00) Rehydration, Adult Rehydration is the replacement of body fluids, salts, and minerals (electrolytes) that are lost during dehydration. Dehydration is when there is not enough water or other fluids in the body. This happens when you lose more fluids than you take in. Common causes of dehydration include: Not drinking enough fluids. This can occur when you are ill or doing activities that require a lot of energy, especially in hot weather. Conditions that cause loss of water or other fluids, such as diarrhea, vomiting, sweating, or urinating a lot. Other illnesses, such as fever or infection. Certain medicines, such as those that remove excess fluid from the body (diuretics). Symptoms of mild or moderate dehydration may include thirst, dry lips and mouth, and dizziness. Symptoms of severe dehydration may include increased heart rate, confusion, fainting, and not urinating. For severe dehydration, you may need to get fluids through an IV at the hospital. For mild or moderate dehydration, you can usually rehydrate at home by drinking certain fluids as told by your health care provider. What are the risks? Generally, rehydration is safe. However, taking in too much fluid (overhydration) can be a problem. This is rare. Overhydration can cause an electrolyte imbalance, kidney failure, or a decrease in salt (sodium) levels in the body. Supplies needed You will need an oral rehydration solution (ORS) if your health care  provider tells you to use one. This is a drink to treat dehydration. It can be found in pharmacies and retail stores. How to rehydrate Fluids Follow instructions from your health care provider for rehydration. The kind of fluid and the amount you should drink depend on your condition. In general, you should choose drinks that you prefer. If told by your health care provider, drink an ORS. Make an ORS by following instructions on the package. Start by drinking small amounts, about  cup (120 mL) every 5-10 minutes. Slowly increase how much you drink until you have taken the amount recommended by your health care provider. Drink enough clear fluids to keep your urine pale yellow. If you were told to drink an ORS, finish it first, then start slowly drinking other clear fluids. Drink fluids such as: Water. This includes sparkling water and flavored water. Drinking only water can lead to having too little sodium in your body (hyponatremia). Follow the advice of your health care provider. Water from ice chips you suck on. Fruit juice with water you add to it (diluted). Sports drinks. Hot or cold herbal teas. Broth-based soups. Milk or milk products. Food Follow instructions from your health care provider about what to eat while you rehydrate. Your health care provider may recommend that you slowly begin eating regular foods in  small amounts. Eat foods that contain a healthy balance of electrolytes, such as bananas, oranges, potatoes, tomatoes, and spinach. Avoid foods that are greasy or contain a lot of sugar. In some cases, you may get nutrition through a feeding tube that is passed through your nose and into your stomach (nasogastric tube, or NG tube). This may be done if you have uncontrolled vomiting or diarrhea. Beverages to avoid  Certain beverages may make dehydration worse. While you rehydrate, avoid drinking alcohol. How to tell if you are recovering from dehydration You may be recovering  from dehydration if: You are urinating more often than before you started rehydrating. Your urine is pale yellow. Your energy level improves. You vomit less frequently. You have diarrhea less frequently. Your appetite improves or returns to normal. You feel less dizzy or less light-headed. Your skin tone and color start to look more normal. Follow these instructions at home: Take over-the-counter and prescription medicines only as told by your health care provider. Do not take sodium tablets. Doing this can lead to having too much sodium in your body (hypernatremia). Contact a health care provider if: You continue to have symptoms of mild or moderate dehydration, such as: Thirst. Dry lips. Slightly dry mouth. Dizziness. Dark urine or less urine than normal. Muscle cramps. You continue to vomit or have diarrhea. Get help right away if you: Have symptoms of dehydration that get worse. Have a fever. Have a severe headache. Have been vomiting and the following happens: Your vomiting gets worse or does not go away. Your vomit includes blood or green matter (bile). You cannot eat or drink without vomiting. Have problems with urination or bowel movements, such as: Diarrhea that gets worse or does not go away. Blood in your stool (feces). This may cause stool to look black and tarry. Not urinating, or urinating only a small amount of very dark urine, within 6-8 hours. Have trouble breathing. Have symptoms that get worse with treatment. These symptoms may represent a serious problem that is an emergency. Do not wait to see if the symptoms will go away. Get medical help right away. Call your local emergency services (911 in the [place] Do not drive yourself to the hospital. Summary Rehydration is the replacement of body fluids and minerals (electrolytes) that are lost during dehydration. Follow instructions from your health care provider for rehydration. The kind of fluid and amount you  should drink depend on your condition. Slowly increase how much you drink until you have taken the amount recommended by your health care provider. Contact your health care provider if you continue to show signs of mild or moderate dehydration. This information is not intended to replace advice given to you by your health care provider. Make sure you discuss any questions you have with your health care provider. Document Revised: 06/09/2019 Document Reviewed: 04/19/2019 Elsevier Patient Education  [place].

## 2021-10-26 ENCOUNTER — Telehealth: Payer: Self-pay | Admitting: Hematology

## 2021-10-26 NOTE — Telephone Encounter (Signed)
Scheduled follow-up appointments per 7/5 los. Patient is aware. 

## 2021-10-30 ENCOUNTER — Encounter: Payer: Self-pay | Admitting: Hematology

## 2021-11-06 ENCOUNTER — Other Ambulatory Visit: Payer: Self-pay

## 2021-11-06 DIAGNOSIS — C7951 Secondary malignant neoplasm of bone: Secondary | ICD-10-CM

## 2021-11-07 ENCOUNTER — Inpatient Hospital Stay: Payer: Medicare Other

## 2021-11-07 ENCOUNTER — Other Ambulatory Visit: Payer: Self-pay

## 2021-11-07 ENCOUNTER — Other Ambulatory Visit (HOSPITAL_COMMUNITY): Payer: Medicare Other

## 2021-11-07 VITALS — BP 145/66 | HR 82 | Temp 98.0°F | Resp 16

## 2021-11-07 DIAGNOSIS — C7951 Secondary malignant neoplasm of bone: Secondary | ICD-10-CM

## 2021-11-07 DIAGNOSIS — Z5112 Encounter for antineoplastic immunotherapy: Secondary | ICD-10-CM | POA: Diagnosis not present

## 2021-11-07 DIAGNOSIS — Z7189 Other specified counseling: Secondary | ICD-10-CM

## 2021-11-07 DIAGNOSIS — C642 Malignant neoplasm of left kidney, except renal pelvis: Secondary | ICD-10-CM

## 2021-11-07 LAB — CBC WITH DIFFERENTIAL (CANCER CENTER ONLY)
Abs Immature Granulocytes: 0.06 10*3/uL (ref 0.00–0.07)
Basophils Absolute: 0 10*3/uL (ref 0.0–0.1)
Basophils Relative: 0 %
Eosinophils Absolute: 0.1 10*3/uL (ref 0.0–0.5)
Eosinophils Relative: 1 %
HCT: 38.6 % (ref 36.0–46.0)
Hemoglobin: 12.4 g/dL (ref 12.0–15.0)
Immature Granulocytes: 1 %
Lymphocytes Relative: 11 %
Lymphs Abs: 1.2 10*3/uL (ref 0.7–4.0)
MCH: 27.6 pg (ref 26.0–34.0)
MCHC: 32.1 g/dL (ref 30.0–36.0)
MCV: 85.8 fL (ref 80.0–100.0)
Monocytes Absolute: 1.3 10*3/uL — ABNORMAL HIGH (ref 0.1–1.0)
Monocytes Relative: 11 %
Neutro Abs: 8.4 10*3/uL — ABNORMAL HIGH (ref 1.7–7.7)
Neutrophils Relative %: 76 %
Platelet Count: 291 10*3/uL (ref 150–400)
RBC: 4.5 MIL/uL (ref 3.87–5.11)
RDW: 19.3 % — ABNORMAL HIGH (ref 11.5–15.5)
WBC Count: 11.1 10*3/uL — ABNORMAL HIGH (ref 4.0–10.5)
nRBC: 0 % (ref 0.0–0.2)

## 2021-11-07 LAB — CMP (CANCER CENTER ONLY)
ALT: 57 U/L — ABNORMAL HIGH (ref 0–44)
AST: 59 U/L — ABNORMAL HIGH (ref 15–41)
Albumin: 3.9 g/dL (ref 3.5–5.0)
Alkaline Phosphatase: 58 U/L (ref 38–126)
Anion gap: 6 (ref 5–15)
BUN: 29 mg/dL — ABNORMAL HIGH (ref 8–23)
CO2: 22 mmol/L (ref 22–32)
Calcium: 9.5 mg/dL (ref 8.9–10.3)
Chloride: 110 mmol/L (ref 98–111)
Creatinine: 1.71 mg/dL — ABNORMAL HIGH (ref 0.44–1.00)
GFR, Estimated: 30 mL/min — ABNORMAL LOW (ref >60)
Glucose, Bld: 87 mg/dL (ref 70–99)
Potassium: 4 mmol/L (ref 3.5–5.1)
Sodium: 138 mmol/L (ref 135–145)
Total Bilirubin: 0.5 mg/dL (ref 0.3–1.2)
Total Protein: 6.2 g/dL — ABNORMAL LOW (ref 6.5–8.1)

## 2021-11-07 MED ORDER — HEPARIN SOD (PORK) LOCK FLUSH 100 UNIT/ML IV SOLN
500.0000 [IU] | Freq: Once | INTRAVENOUS | Status: AC | PRN
  Administered 2021-11-07: 500 [IU]

## 2021-11-07 MED ORDER — SODIUM CHLORIDE 0.9 % IV SOLN: INTRAVENOUS | Status: AC

## 2021-11-07 MED ORDER — SODIUM CHLORIDE 0.9 % IV SOLN
240.0000 mg | Freq: Once | INTRAVENOUS | Status: AC
  Administered 2021-11-07: 240 mg via INTRAVENOUS
  Filled 2021-11-07: qty 24

## 2021-11-07 MED ORDER — SODIUM CHLORIDE 0.9% FLUSH
10.0000 mL | INTRAVENOUS | Status: DC | PRN
  Administered 2021-11-07: 10 mL

## 2021-11-07 NOTE — Patient Instructions (Signed)
[facility]  Discharge Instructions: Thank you for choosing [facility] to provide your oncology and hematology care.   If you have a lab appointment with the [facility], please go directly to the [facility] and check in at the registration area.   Wear comfortable clothing and clothing appropriate for easy access to any Portacath or PICC line.   We strive to give you quality time with your provider. You may need to reschedule your appointment if you arrive late (15 or more minutes).  Arriving late affects you and other patients whose appointments are after yours.  Also, if you miss three or more appointments without notifying the office, you may be dismissed from the clinic at the provider's discretion.      For prescription refill requests, have your pharmacy contact our office and allow 72 hours for refills to be completed.    Today you received the following chemotherapy and/or immunotherapy agents: Opdivo.      To help prevent nausea and vomiting after your treatment, we encourage you to take your nausea medication as directed.  BELOW ARE SYMPTOMS THAT SHOULD BE REPORTED IMMEDIATELY: *FEVER GREATER THAN 100.4 F (38 C) OR HIGHER *CHILLS OR SWEATING *NAUSEA AND VOMITING THAT IS NOT CONTROLLED WITH YOUR NAUSEA MEDICATION *UNUSUAL SHORTNESS OF BREATH *UNUSUAL BRUISING OR BLEEDING *URINARY PROBLEMS (pain or burning when urinating, or frequent urination) *BOWEL PROBLEMS (unusual diarrhea, constipation, pain near the anus) TENDERNESS IN MOUTH AND THROAT WITH OR WITHOUT PRESENCE OF ULCERS (sore throat, sores in mouth, or a toothache) UNUSUAL RASH, SWELLING OR PAIN  UNUSUAL VAGINAL DISCHARGE OR ITCHING   Items with * indicate a potential emergency and should be followed up as soon as possible or go to the Emergency Department if any problems should occur.  Please show the CHEMOTHERAPY ALERT CARD or IMMUNOTHERAPY ALERT CARD at check-in to  the Emergency Department and triage nurse.  Should you have questions after your visit or need to cancel or reschedule your appointment, please contact [facility]  Dept: [phone]  and follow the prompts.  Office hours are 8:00 a.m. to 4:30 p.m. Monday - Friday. Please note that voicemails left after 4:00 p.m. may not be returned until the following business day.  We are closed weekends and major holidays. You have access to a nurse at all times for urgent questions. Please call the main number to the clinic Dept: [phone] and follow the prompts.   For any non-urgent questions, you may also contact your provider using MyChart. We now offer e-Visits for anyone 26 and older to request care online for non-urgent symptoms. For details visit [email or web address].   Also download the MyChart app! Go to the app store, search "MyChart", open the app, select [facility], and log in with your MyChart username and password.  Masks are optional in the cancer centers. If you would like for your care team to wear a mask while they are taking care of you, please let them know. For doctor visits, patients may have with them one support person who is at least 80 years old. At this time, visitors are not allowed in the infusion area. Nivolumab injection What is this medication? NIVOLUMAB (nye VOL ue mab) is a monoclonal antibody. It treats certain types of cancer. Some of the cancers treated are colon cancer, head and neck cancer, Hodgkin lymphoma, lung cancer, and melanoma. This medicine may be used for other purposes; ask your health care  provider or pharmacist if you have questions. COMMON BRAND NAME(S): Opdivo What should I tell my care team before I take this medication? They need to know if you have any of these conditions: Autoimmune diseases such as Crohn's disease, ulcerative colitis, or lupus Have had or planning to have an allogeneic stem cell transplant (uses  someone else's stem cells) History of chest radiation Organ transplant Nervous system problems such as myasthenia gravis or Guillain-Barre syndrome An unusual or allergic reaction to nivolumab, other medicines, foods, dyes, or preservatives Pregnant or trying to get pregnant Breast-feeding How should I use this medication? This medication is injected into a vein. It is given in a hospital or clinic setting. A special MedGuide will be given to you before each treatment. Be sure to read this information carefully each time. Talk to your care team regarding the use of this medication in children. While it may be prescribed for children as young as 12 years for selected conditions, precautions do apply. Overdosage: If you think you have taken too much of this medicine contact a poison control center or emergency room at once. NOTE: This medicine is only for you. Do not share this medicine with others. What if I miss a dose? Keep appointments for follow-up doses. It is important not to miss your dose. Call your care team if you are unable to keep an appointment. What may interact with this medication? Interactions have not been studied. This list may not describe all possible interactions. Give your health care provider a list of all the medicines, herbs, non-prescription drugs, or dietary supplements you use. Also tell them if you smoke, drink alcohol, or use illegal drugs. Some items may interact with your medicine. What should I watch for while using this medication? Your condition will be monitored carefully while you are receiving this medication. You may need blood work done while you are taking this medication. Do not become pregnant while taking this medication or for 5 months after stopping it. Women should inform their care team if they wish to become pregnant or think they might be pregnant. There is a potential for serious harm to an unborn child. Talk to your care team for more  information. Do not breast-feed an infant while taking this medication or for 5 months after stopping it. What side effects may I notice from receiving this medication? Side effects that you should report to your care team as soon as possible: Allergic reactions--skin rash, itching, hives, swelling of the face, lips, tongue, or throat Bloody or black, tar-like stools Change in vision Chest pain Diarrhea Dry cough, shortness of breath or trouble breathing Eye pain Fast or irregular heartbeat Fever, chills High blood sugar (hyperglycemia)--increased thirst or amount of urine, unusual weakness or fatigue, blurry vision High thyroid levels (hyperthyroidism)--fast or irregular heartbeat, weight loss, excessive sweating or sensitivity to heat, tremors or shaking, anxiety, nervousness, irregular menstrual cycle or spotting Kidney injury--decrease in the amount of urine, swelling of the ankles, hands, or feet Liver injury--right upper belly pain, loss of appetite, nausea, light-colored stool, dark yellow or brown urine, yellowing skin or eyes, unusual weakness or fatigue Low red blood cell count--unusual weakness or fatigue, dizziness, headache, trouble breathing Low thyroid levels (hypothyroidism)--unusual weakness or fatigue, increased sensitivity to cold, constipation, hair loss, dry skin, weight gain, feelings of depression Mood and behavior changes-confusion, change in sex drive or performance, irritability Muscle pain or cramps Pain, tingling, or numbness in the hands or feet, muscle weakness, trouble walking, loss  of balance or coordination Red or dark brown urine Redness, blistering, peeling, or loosening of the skin, including inside the mouth Stomach pain Unusual bruising or bleeding Side effects that usually do not require medical attention (report to your care team if they continue or are bothersome): Bone pain Constipation Loss of appetite Nausea Tiredness Vomiting This list may  not describe all possible side effects. Call your doctor for medical advice about side effects. You may report side effects to FDA at 1-800-FDA-1088. Where should I keep my medication? This medication is given in a hospital or clinic and will not be stored at home. NOTE: This sheet is a summary. It may not cover all possible information. If you have questions about this medicine, talk to your doctor, pharmacist, or health care provider.  2023 Elsevier/Gold Standard (2021-03-09 00:00:00) Rehydration, Adult Rehydration is the replacement of body fluids, salts, and minerals (electrolytes) that are lost during dehydration. Dehydration is when there is not enough water or other fluids in the body. This happens when you lose more fluids than you take in. Common causes of dehydration include: Not drinking enough fluids. This can occur when you are ill or doing activities that require a lot of energy, especially in hot weather. Conditions that cause loss of water or other fluids, such as diarrhea, vomiting, sweating, or urinating a lot. Other illnesses, such as fever or infection. Certain medicines, such as those that remove excess fluid from the body (diuretics). Symptoms of mild or moderate dehydration may include thirst, dry lips and mouth, and dizziness. Symptoms of severe dehydration may include increased heart rate, confusion, fainting, and not urinating. For severe dehydration, you may need to get fluids through an IV at the hospital. For mild or moderate dehydration, you can usually rehydrate at home by drinking certain fluids as told by your health care provider. What are the risks? Generally, rehydration is safe. However, taking in too much fluid (overhydration) can be a problem. This is rare. Overhydration can cause an electrolyte imbalance, kidney failure, or a decrease in salt (sodium) levels in the body. Supplies needed You will need an oral rehydration solution (ORS) if your health care  provider tells you to use one. This is a drink to treat dehydration. It can be found in pharmacies and retail stores. How to rehydrate Fluids Follow instructions from your health care provider for rehydration. The kind of fluid and the amount you should drink depend on your condition. In general, you should choose drinks that you prefer. If told by your health care provider, drink an ORS. Make an ORS by following instructions on the package. Start by drinking small amounts, about  cup (120 mL) every 5-10 minutes. Slowly increase how much you drink until you have taken the amount recommended by your health care provider. Drink enough clear fluids to keep your urine pale yellow. If you were told to drink an ORS, finish it first, then start slowly drinking other clear fluids. Drink fluids such as: Water. This includes sparkling water and flavored water. Drinking only water can lead to having too little sodium in your body (hyponatremia). Follow the advice of your health care provider. Water from ice chips you suck on. Fruit juice with water you add to it (diluted). Sports drinks. Hot or cold herbal teas. Broth-based soups. Milk or milk products. Food Follow instructions from your health care provider about what to eat while you rehydrate. Your health care provider may recommend that you slowly begin eating regular foods in  small amounts. Eat foods that contain a healthy balance of electrolytes, such as bananas, oranges, potatoes, tomatoes, and spinach. Avoid foods that are greasy or contain a lot of sugar. In some cases, you may get nutrition through a feeding tube that is passed through your nose and into your stomach (nasogastric tube, or NG tube). This may be done if you have uncontrolled vomiting or diarrhea. Beverages to avoid  Certain beverages may make dehydration worse. While you rehydrate, avoid drinking alcohol. How to tell if you are recovering from dehydration You may be recovering  from dehydration if: You are urinating more often than before you started rehydrating. Your urine is pale yellow. Your energy level improves. You vomit less frequently. You have diarrhea less frequently. Your appetite improves or returns to normal. You feel less dizzy or less light-headed. Your skin tone and color start to look more normal. Follow these instructions at home: Take over-the-counter and prescription medicines only as told by your health care provider. Do not take sodium tablets. Doing this can lead to having too much sodium in your body (hypernatremia). Contact a health care provider if: You continue to have symptoms of mild or moderate dehydration, such as: Thirst. Dry lips. Slightly dry mouth. Dizziness. Dark urine or less urine than normal. Muscle cramps. You continue to vomit or have diarrhea. Get help right away if you: Have symptoms of dehydration that get worse. Have a fever. Have a severe headache. Have been vomiting and the following happens: Your vomiting gets worse or does not go away. Your vomit includes blood or green matter (bile). You cannot eat or drink without vomiting. Have problems with urination or bowel movements, such as: Diarrhea that gets worse or does not go away. Blood in your stool (feces). This may cause stool to look black and tarry. Not urinating, or urinating only a small amount of very dark urine, within 6-8 hours. Have trouble breathing. Have symptoms that get worse with treatment. These symptoms may represent a serious problem that is an emergency. Do not wait to see if the symptoms will go away. Get medical help right away. Call your local emergency services (911 in the [place] Do not drive yourself to the hospital. Summary Rehydration is the replacement of body fluids and minerals (electrolytes) that are lost during dehydration. Follow instructions from your health care provider for rehydration. The kind of fluid and amount you  should drink depend on your condition. Slowly increase how much you drink until you have taken the amount recommended by your health care provider. Contact your health care provider if you continue to show signs of mild or moderate dehydration. This information is not intended to replace advice given to you by your health care provider. Make sure you discuss any questions you have with your health care provider. Document Revised: 06/09/2019 Document Reviewed: 04/19/2019 Elsevier Patient Education  [place].

## 2021-11-08 ENCOUNTER — Encounter (HOSPITAL_COMMUNITY): Payer: Self-pay

## 2021-11-09 ENCOUNTER — Other Ambulatory Visit: Payer: Self-pay

## 2021-11-09 ENCOUNTER — Inpatient Hospital Stay: Payer: Medicare Other

## 2021-11-09 ENCOUNTER — Encounter (HOSPITAL_COMMUNITY)
Admission: RE | Admit: 2021-11-09 | Discharge: 2021-11-09 | Disposition: A | Discharge status: Home / Self Care | Payer: Medicare Other | Source: Ambulatory Visit | Attending: Hematology | Admitting: Hematology

## 2021-11-09 DIAGNOSIS — C642 Malignant neoplasm of left kidney, except renal pelvis: Secondary | ICD-10-CM

## 2021-11-09 DIAGNOSIS — Z95828 Presence of other vascular implants and grafts: Secondary | ICD-10-CM

## 2021-11-09 DIAGNOSIS — C7951 Secondary malignant neoplasm of bone: Secondary | ICD-10-CM

## 2021-11-09 DIAGNOSIS — Z5112 Encounter for antineoplastic immunotherapy: Secondary | ICD-10-CM | POA: Diagnosis not present

## 2021-11-09 LAB — GLUCOSE, CAPILLARY: Glucose-Capillary: 128 mg/dL — ABNORMAL HIGH (ref 70–99)

## 2021-11-09 MED ORDER — FLUDEOXYGLUCOSE F - 18 (FDG) INJECTION
6.0000 | Freq: Once | INTRAVENOUS | Status: AC | PRN
  Administered 2021-11-09: 6 via INTRAVENOUS

## 2021-11-09 MED ORDER — SODIUM CHLORIDE 0.9% FLUSH
10.0000 mL | Freq: Once | INTRAVENOUS | Status: AC
  Administered 2021-11-09: 10 mL

## 2021-11-12 ENCOUNTER — Other Ambulatory Visit: Payer: Self-pay

## 2021-11-19 ENCOUNTER — Other Ambulatory Visit: Payer: Self-pay | Admitting: *Deleted

## 2021-11-19 DIAGNOSIS — C642 Malignant neoplasm of left kidney, except renal pelvis: Secondary | ICD-10-CM

## 2021-11-20 ENCOUNTER — Encounter: Payer: Self-pay | Admitting: Hematology

## 2021-11-20 ENCOUNTER — Other Ambulatory Visit: Payer: Self-pay

## 2021-11-21 ENCOUNTER — Inpatient Hospital Stay: Payer: Medicare Other | Attending: Hematology

## 2021-11-21 ENCOUNTER — Other Ambulatory Visit: Payer: Self-pay | Admitting: Hematology

## 2021-11-21 ENCOUNTER — Other Ambulatory Visit: Payer: Self-pay

## 2021-11-21 ENCOUNTER — Inpatient Hospital Stay: Payer: Medicare Other

## 2021-11-21 VITALS — BP 133/62 | HR 64 | Temp 97.7°F | Resp 16

## 2021-11-21 DIAGNOSIS — Z95828 Presence of other vascular implants and grafts: Secondary | ICD-10-CM

## 2021-11-21 DIAGNOSIS — K439 Ventral hernia without obstruction or gangrene: Secondary | ICD-10-CM | POA: Insufficient documentation

## 2021-11-21 DIAGNOSIS — E1122 Type 2 diabetes mellitus with diabetic chronic kidney disease: Secondary | ICD-10-CM

## 2021-11-21 DIAGNOSIS — C642 Malignant neoplasm of left kidney, except renal pelvis: Secondary | ICD-10-CM

## 2021-11-21 DIAGNOSIS — Z87891 Personal history of nicotine dependence: Secondary | ICD-10-CM | POA: Diagnosis not present

## 2021-11-21 DIAGNOSIS — G629 Polyneuropathy, unspecified: Secondary | ICD-10-CM | POA: Diagnosis not present

## 2021-11-21 DIAGNOSIS — K5900 Constipation, unspecified: Secondary | ICD-10-CM | POA: Diagnosis not present

## 2021-11-21 DIAGNOSIS — I700 Atherosclerosis of aorta: Secondary | ICD-10-CM | POA: Insufficient documentation

## 2021-11-21 DIAGNOSIS — C7951 Secondary malignant neoplasm of bone: Secondary | ICD-10-CM

## 2021-11-21 DIAGNOSIS — I739 Peripheral vascular disease, unspecified: Secondary | ICD-10-CM | POA: Insufficient documentation

## 2021-11-21 DIAGNOSIS — E119 Type 2 diabetes mellitus without complications: Secondary | ICD-10-CM | POA: Diagnosis not present

## 2021-11-21 DIAGNOSIS — I252 Old myocardial infarction: Secondary | ICD-10-CM | POA: Insufficient documentation

## 2021-11-21 DIAGNOSIS — K219 Gastro-esophageal reflux disease without esophagitis: Secondary | ICD-10-CM | POA: Diagnosis not present

## 2021-11-21 DIAGNOSIS — I2510 Atherosclerotic heart disease of native coronary artery without angina pectoris: Secondary | ICD-10-CM | POA: Insufficient documentation

## 2021-11-21 DIAGNOSIS — C649 Malignant neoplasm of unspecified kidney, except renal pelvis: Secondary | ICD-10-CM | POA: Insufficient documentation

## 2021-11-21 DIAGNOSIS — Z5112 Encounter for antineoplastic immunotherapy: Secondary | ICD-10-CM | POA: Insufficient documentation

## 2021-11-21 DIAGNOSIS — E785 Hyperlipidemia, unspecified: Secondary | ICD-10-CM | POA: Diagnosis not present

## 2021-11-21 DIAGNOSIS — I10 Essential (primary) hypertension: Secondary | ICD-10-CM | POA: Diagnosis not present

## 2021-11-21 DIAGNOSIS — E039 Hypothyroidism, unspecified: Secondary | ICD-10-CM | POA: Diagnosis not present

## 2021-11-21 DIAGNOSIS — Z7189 Other specified counseling: Secondary | ICD-10-CM

## 2021-11-21 LAB — CBC WITH DIFFERENTIAL (CANCER CENTER ONLY)
Abs Immature Granulocytes: 0.2 10*3/uL — ABNORMAL HIGH (ref 0.00–0.07)
Basophils Absolute: 0.1 10*3/uL (ref 0.0–0.1)
Basophils Relative: 2 %
Eosinophils Absolute: 0.3 10*3/uL (ref 0.0–0.5)
Eosinophils Relative: 3 %
HCT: 41.2 % (ref 36.0–46.0)
Hemoglobin: 13.3 g/dL (ref 12.0–15.0)
Immature Granulocytes: 3 %
Lymphocytes Relative: 16 %
Lymphs Abs: 1.3 10*3/uL (ref 0.7–4.0)
MCH: 28.5 pg (ref 26.0–34.0)
MCHC: 32.3 g/dL (ref 30.0–36.0)
MCV: 88.2 fL (ref 80.0–100.0)
Monocytes Absolute: 1 10*3/uL (ref 0.1–1.0)
Monocytes Relative: 12 %
Neutro Abs: 5.2 10*3/uL (ref 1.7–7.7)
Neutrophils Relative %: 64 %
Platelet Count: 321 10*3/uL (ref 150–400)
RBC: 4.67 MIL/uL (ref 3.87–5.11)
RDW: 19 % — ABNORMAL HIGH (ref 11.5–15.5)
WBC Count: 8 10*3/uL (ref 4.0–10.5)
nRBC: 0 % (ref 0.0–0.2)

## 2021-11-21 LAB — CMP (CANCER CENTER ONLY)
ALT: 196 U/L — ABNORMAL HIGH (ref 0–44)
AST: 105 U/L — ABNORMAL HIGH (ref 15–41)
Albumin: 4.1 g/dL (ref 3.5–5.0)
Alkaline Phosphatase: 73 U/L (ref 38–126)
Anion gap: 6 (ref 5–15)
BUN: 30 mg/dL — ABNORMAL HIGH (ref 8–23)
CO2: 20 mmol/L — ABNORMAL LOW (ref 22–32)
Calcium: 9.2 mg/dL (ref 8.9–10.3)
Chloride: 113 mmol/L — ABNORMAL HIGH (ref 98–111)
Creatinine: 1.58 mg/dL — ABNORMAL HIGH (ref 0.44–1.00)
GFR, Estimated: 33 mL/min — ABNORMAL LOW (ref >60)
Glucose, Bld: 138 mg/dL — ABNORMAL HIGH (ref 70–99)
Potassium: 5 mmol/L (ref 3.5–5.1)
Sodium: 139 mmol/L (ref 135–145)
Total Bilirubin: 0.4 mg/dL (ref 0.3–1.2)
Total Protein: 6.7 g/dL (ref 6.5–8.1)

## 2021-11-21 LAB — TSH: TSH: 1.417 u[IU]/mL (ref 0.350–4.500)

## 2021-11-21 MED ORDER — SODIUM CHLORIDE 0.9% FLUSH
10.0000 mL | Freq: Once | INTRAVENOUS | Status: AC
  Administered 2021-11-21: 10 mL

## 2021-11-21 MED ORDER — SODIUM CHLORIDE 0.9% FLUSH
10.0000 mL | INTRAVENOUS | Status: DC | PRN
  Administered 2021-11-21: 10 mL

## 2021-11-21 MED ORDER — HEPARIN SOD (PORK) LOCK FLUSH 100 UNIT/ML IV SOLN
500.0000 [IU] | Freq: Once | INTRAVENOUS | Status: AC | PRN
  Administered 2021-11-21: 500 [IU]

## 2021-11-21 MED ORDER — SODIUM CHLORIDE 0.9 % IV SOLN: INTRAVENOUS | Status: AC

## 2021-11-21 NOTE — Patient Instructions (Signed)
Liver Function Tests Why am I having this test? Liver function tests are done to see how well your liver is working. The proteins and enzymes measured in the tests can alert your health care provider to inflammation, damage, or disease in your liver. It is common to have liver function tests: When you are taking certain medicines. If you have liver disease. If you drink a lot of alcohol. During annual physical exams. When you have other conditions that may affect your liver. If you have symptoms such as yellowing of the skin (jaundice), abdominal pain, or nausea and vomiting. What is being tested? These tests measure various substances in your blood. This may include: Alanine aminotransferase (ALT). This is an enzyme in the liver. Aspartate aminotransferase (AST). This is an enzyme in the liver, heart, and muscles. Alkaline phosphatase (ALP). This is a protein in the liver, bile ducts, bone, and other body tissues. Total bilirubin. This is a yellow pigment in bile. Albumin. This is a protein in the liver. Prothrombin time and international normalized ratio (PT and INR). PT measures the time it takes for your blood to clot. INR is a calculation of blood clotting time based on your PT result. Total protein. This includes two proteins, albumin and globulin, found in the blood. What kind of sample is taken?  A blood sample is required for this test. It is usually collected by inserting a needle into a blood vessel. How do I prepare for this test? How you prepare will depend on which tests are being done and the reason for doing them. You may need to: Avoid eating for 4-6 hours before the test, or as told by your health care provider. Follow instructions from your health care provider about eating or drinking restrictions before the tests. Stop taking certain medicines before your blood test, as told by your health care provider. Tell a health care provider about: All medicines you are taking,  including vitamins, herbs, eye drops, creams, and over-the-counter medicines. Any medical conditions you have. Whether you are pregnant or may be pregnant. How are the results reported? Your test results will be reported as values. Your health care provider will compare your results to normal ranges that were established after testing a large group of people (reference ranges). Reference ranges may vary among labs and hospitals. For the substances measured in liver function tests, common reference ranges are: ALT Infant: 10-40 international units/L. Child or adult: 4-36 international units/L at 37C or 4-36 units/L (SI units). Reference ranges may be higher for older adults. AST Newborn 0-5 days old: 35-140 units/L. Child younger than 3 years old: 15-60 units/L. 3-6 years old: 15-50 units/L. 6-12 years old: 10-50 units/L. 12-18 years old: 10-40 units/L. Adult: 0-35 units/L or 0-0.58 microkatals/L (SI units). Reference ranges may be higher for older adults. ALP Child younger than 2 years old: 85-235 units/L. 2-8 years old: 65-210 units/L. 9-15 years old: 60-300 units/L. 16-21 years old: 30-200 units/L. Adult: 30-120 units/L or 0.5-2.0 microkatals/L (SI units). Reference ranges may be higher for older adults. Total bilirubin Newborn: 1.0-12.0 mg/dL or 17.1-205 micromoles/L (SI units). Child or adult: 0.3-1.0 mg/dL or 5.1-17 micromoles/L. Albumin Premature infant: 3.0-4.2 g/dL. Newborn: 3.5-5.4 g/dL. Infant: 4.4-5.4 g/dL. Child: 4.0-5.9 g/dL. Adult: 3.5-5.0 g/dL or 35-50 g/L (SI units). PT 11.0-12.5 seconds; 85%-100%. INR 0.8-1.1. Total protein Premature infant: 4.2-7.6 g/dL. Newborn: 4.6-7.4 g/dL. Infant: 6.0-6.7 g/dL. Child: 6.2-8.0 g/dL. Adult: 6.4-8.3 g/dL or 64-83 g/L (SI units). What do the results mean? Results that are within   the reference ranges are considered normal. For each substance measured, results outside the reference range can indicate various health  issues. ALT Levels above the normal range may indicate liver disease. Sometimes levels also increase after burns, surgery, heart attack, muscle damage, or seizure. AST Levels above the normal range may indicate liver disease, skeletal muscle diseases, or other diseases that destroy the red blood cells or tissues of the pancreas. Levels below the normal range may indicate acute kidney disease, pregnancy, or diabetic ketoacidosis. ALP Levels above the normal range may be seen in biliary obstruction, liver diseases, bone disease, thyroid disease, lymphoma, or several other conditions. People with blood type O or B may show higher levels after a fatty meal. Levels below the normal range may indicate bone and teeth conditions, malnutrition, protein deficiency, or Wilson's disease. Total bilirubin Levels above the normal range may indicate problems with the liver, gallbladder, or bile ducts. Albumin Levels above the normal range may indicate dehydration. They may also be caused by a diet that is high in protein. Levels below the normal range may indicate kidney disease, liver disease, or malabsorption of nutrients. PT and INR Levels above the normal range mean that your blood is clotting slower than normal. This may be due to blood disorders, liver disorders, certain medicines like warfarin, or low levels of vitamin K. Total protein Levels above the normal range may be due to infection or other diseases. Levels below the normal range may be due to an immune system disorder, bleeding, burns, kidney disorder, liver disease, trouble absorbing or getting nutrients, or other conditions that affect the intestines. Talk with your health care provider about what your results mean. Questions to ask your health care provider Ask your health care provider, or the department that is doing the test: When will my results be ready? How will I get my results? What are my treatment options? What other tests do I  need? What are my next steps? Summary Liver function tests are done to see how well your liver is working. The results can alert your health care provider to inflammation, damage, or disease in your liver. You may need to avoid eating for 4-6 hours before the test or stop taking certain medicines before your blood test, as told by your health care provider. Talk with your health care provider about what your results mean. This information is not intended to replace advice given to you by your health care provider. Make sure you discuss any questions you have with your health care provider. Document Revised: 01/11/2020 Document Reviewed: 01/11/2020 Elsevier Patient Education  2023 Elsevier Inc.  

## 2021-11-21 NOTE — Progress Notes (Signed)
This nurse notified [name] of AST 105 and ALT 196. [name] stated "would hold treatment today and she can keep her next scheduled treatment in 2 weeks." Per Dr. Rico Junker can be administered with treatment in 2 weeks. Patient made aware.

## 2021-11-28 ENCOUNTER — Other Ambulatory Visit: Payer: Self-pay | Admitting: *Deleted

## 2021-11-28 MED ORDER — LIDOCAINE 5 % EX PTCH: 1.0000 | MEDICATED_PATCH | Freq: Every day | CUTANEOUS | 1 refills | Status: DC | PRN

## 2021-11-30 ENCOUNTER — Other Ambulatory Visit: Payer: Self-pay | Admitting: *Deleted

## 2021-11-30 DIAGNOSIS — C642 Malignant neoplasm of left kidney, except renal pelvis: Secondary | ICD-10-CM

## 2021-12-05 ENCOUNTER — Other Ambulatory Visit: Payer: Self-pay

## 2021-12-05 ENCOUNTER — Other Ambulatory Visit: Payer: Self-pay | Admitting: Hematology

## 2021-12-05 ENCOUNTER — Inpatient Hospital Stay: Payer: Medicare Other

## 2021-12-05 ENCOUNTER — Inpatient Hospital Stay (HOSPITAL_BASED_OUTPATIENT_CLINIC_OR_DEPARTMENT_OTHER): Payer: Medicare Other | Admitting: Hematology

## 2021-12-05 VITALS — BP 138/57 | HR 59 | Temp 98.2°F | Resp 18 | Wt 120.5 lb

## 2021-12-05 DIAGNOSIS — Z95828 Presence of other vascular implants and grafts: Secondary | ICD-10-CM

## 2021-12-05 DIAGNOSIS — C7951 Secondary malignant neoplasm of bone: Secondary | ICD-10-CM

## 2021-12-05 DIAGNOSIS — C642 Malignant neoplasm of left kidney, except renal pelvis: Secondary | ICD-10-CM

## 2021-12-05 DIAGNOSIS — Z5112 Encounter for antineoplastic immunotherapy: Secondary | ICD-10-CM | POA: Diagnosis not present

## 2021-12-05 DIAGNOSIS — Z7189 Other specified counseling: Secondary | ICD-10-CM

## 2021-12-05 LAB — CMP (CANCER CENTER ONLY)
ALT: 43 U/L (ref 0–44)
AST: 28 U/L (ref 15–41)
Albumin: 3.7 g/dL (ref 3.5–5.0)
Alkaline Phosphatase: 79 U/L (ref 38–126)
Anion gap: 5 (ref 5–15)
BUN: 36 mg/dL — ABNORMAL HIGH (ref 8–23)
CO2: 19 mmol/L — ABNORMAL LOW (ref 22–32)
Calcium: 8.8 mg/dL — ABNORMAL LOW (ref 8.9–10.3)
Chloride: 112 mmol/L — ABNORMAL HIGH (ref 98–111)
Creatinine: 1.56 mg/dL — ABNORMAL HIGH (ref 0.44–1.00)
GFR, Estimated: 34 mL/min — ABNORMAL LOW (ref >60)
Glucose, Bld: 191 mg/dL — ABNORMAL HIGH (ref 70–99)
Potassium: 4.6 mmol/L (ref 3.5–5.1)
Sodium: 136 mmol/L (ref 135–145)
Total Bilirubin: 0.3 mg/dL (ref 0.3–1.2)
Total Protein: 6.4 g/dL — ABNORMAL LOW (ref 6.5–8.1)

## 2021-12-05 LAB — CBC WITH DIFFERENTIAL (CANCER CENTER ONLY)
Abs Immature Granulocytes: 0.23 10*3/uL — ABNORMAL HIGH (ref 0.00–0.07)
Basophils Absolute: 0.1 10*3/uL (ref 0.0–0.1)
Basophils Relative: 1 %
Eosinophils Absolute: 0.3 10*3/uL (ref 0.0–0.5)
Eosinophils Relative: 3 %
HCT: 38.7 % (ref 36.0–46.0)
Hemoglobin: 12.6 g/dL (ref 12.0–15.0)
Immature Granulocytes: 3 %
Lymphocytes Relative: 13 %
Lymphs Abs: 1.2 10*3/uL (ref 0.7–4.0)
MCH: 28.5 pg (ref 26.0–34.0)
MCHC: 32.6 g/dL (ref 30.0–36.0)
MCV: 87.6 fL (ref 80.0–100.0)
Monocytes Absolute: 1.1 10*3/uL — ABNORMAL HIGH (ref 0.1–1.0)
Monocytes Relative: 12 %
Neutro Abs: 6.2 10*3/uL (ref 1.7–7.7)
Neutrophils Relative %: 68 %
Platelet Count: 250 10*3/uL (ref 150–400)
RBC: 4.42 MIL/uL (ref 3.87–5.11)
RDW: 17.2 % — ABNORMAL HIGH (ref 11.5–15.5)
WBC Count: 9.1 10*3/uL (ref 4.0–10.5)
nRBC: 0 % (ref 0.0–0.2)

## 2021-12-05 LAB — IRON AND IRON BINDING CAPACITY (CC-WL,HP ONLY)
Iron: 58 ug/dL (ref 28–170)
Saturation Ratios: 21 % (ref 10.4–31.8)
TIBC: 273 ug/dL (ref 250–450)
UIBC: 215 ug/dL (ref 148–442)

## 2021-12-05 LAB — FERRITIN: Ferritin: 56 ng/mL (ref 11–307)

## 2021-12-05 MED ORDER — SODIUM CHLORIDE 0.9 % IV SOLN
240.0000 mg | Freq: Once | INTRAVENOUS | Status: AC
  Administered 2021-12-05: 240 mg via INTRAVENOUS
  Filled 2021-12-05: qty 24

## 2021-12-05 MED ORDER — DENOSUMAB 120 MG/1.7ML ~~LOC~~ SOLN
120.0000 mg | Freq: Once | SUBCUTANEOUS | Status: AC
  Administered 2021-12-05: 120 mg via SUBCUTANEOUS

## 2021-12-05 MED ORDER — SODIUM CHLORIDE 0.9% FLUSH
10.0000 mL | Freq: Once | INTRAVENOUS | Status: AC
  Administered 2021-12-05: 10 mL

## 2021-12-05 MED ORDER — SODIUM CHLORIDE 0.9 % IV SOLN: INTRAVENOUS | Status: AC

## 2021-12-05 MED ORDER — DENOSUMAB 120 MG/1.7ML ~~LOC~~ SOLN
SUBCUTANEOUS | Status: AC
  Filled 2021-12-05: qty 1.7

## 2021-12-05 NOTE — Progress Notes (Signed)
Ok to proceed with xgeva today per [name]

## 2021-12-11 NOTE — Progress Notes (Signed)
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[place])   01/02/2018 -  Chemotherapy   Patient is on Treatment Plan : RENAL CELL CARCINOMA NIVOLUMAB Q14D     Renal cell carcinoma ([place])  04/11/2017 Initial Diagnosis   Renal cell carcinoma ([place])   01/02/2018 -  Chemotherapy   The patient had nivolumab (OPDIVO) 240 mg in sodium chloride 0.9 % 100 mL chemo infusion, 240 mg, Intravenous, Once, 47 of 50 cycles Administration: 240 mg (01/02/2018), 240 mg (01/15/2018), 240 mg (01/30/2018), 240 mg (02/13/2018), 240 mg (02/27/2018), 240 mg (03/13/2018), 240 mg (03/27/2018), 240 mg (04/10/2018), 240 mg (04/24/2018), 240 mg (05/08/2018), 240 mg (05/22/2018), 240 mg (06/05/2018), 240 mg (06/19/2018), 240 mg (08/28/2018), 240 mg (09/11/2018), 240 mg (10/09/2018), 240 mg (10/22/2018), 240 mg (11/06/2018), 240 mg (11/20/2018), 240 mg (12/04/2018), 240 mg (12/18/2018), 240 mg (01/01/2019), 240 mg (01/15/2019), 240 mg (01/29/2019), 240 mg (02/12/2019), 240 mg (02/26/2019), 240 mg (03/12/2019), 240 mg (03/26/2019), 240 mg (04/09/2019), 240 mg (04/28/2019), 240 mg (05/12/2019), 240 mg (05/27/2019), 240 mg (06/09/2019), 240 mg (06/23/2019), 240 mg (07/07/2019), 240 mg (07/21/2019), 240 mg (08/04/2019), 240 mg (08/18/2019), 240 mg (09/03/2019), 240 mg (09/15/2019), 240 mg (09/29/2019), 240 mg (10/12/2019), 240 mg (10/27/2019), 240 mg (11/10/2019), 240 mg (12/08/2019), 240 mg (12/22/2019), 240 mg (01/19/2020)  for chemotherapy treatment.      CURRENT THERAPY: metastatic renal cell carcinoma, Delton See and Nivolumab  INTERVAL HISTORY:  Audrey [name] 81 y.o. female is here for  continued evaluation and management of metastatic renal cell carcinoma. She notes continued chronic lower extremity discomfort from peripheral arterial disease and neuropathy. No acute new focal symptoms. No urinary changes or hematuria. No new chest pain or shortness of breath. No abdominal pain or distention. No new focal neurological deficits. Labs done today were reviewed in detail with the patient PET CT scan done on 11/09/2021 discussed with the patient  Patient Active Problem List   Diagnosis Date Noted   Type 2 diabetes mellitus ([place]) 08/15/2021   Incisional hernia 06/23/2020   Gastritis and gastroduodenitis 02/26/2020   Ventral hernia without obstruction or gangrene 02/26/2020   Rectal discomfort 02/26/2020   History of colonic polyps 02/26/2020   History of cholecystectomy 07/17/2019   Anemia 10/10/2018   Chronic cholecystitis 09/04/2018   Acute cholecystitis 08/29/2018   Constipation 08/23/2018   Abdominal pain 08/23/2018   Biliary stricture 08/23/2018   Preop cardiovascular exam 08/14/2018   Coronary artery disease involving native coronary artery of native heart without angina pectoris 08/14/2018   Dyslipidemia 08/14/2018   Abnormal LFTs 08/05/2018   Choledocholithiasis 08/05/2018   Dilated bile duct 08/05/2018   History of ERCP 08/05/2018   History of renal cell carcinoma 08/05/2018   Port-A-Cath in place 01/30/2018   Counseling regarding advance care planning and goals of care 01/04/2018   Renal cell carcinoma ([place]) 04/11/2017   Cancer, 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[place]) 01/10/2017   Left renal mass 08/01/2016    is allergic to doxycycline, adhesive [tape], gabapentin (once-daily), nsaids, and statins.  MEDICAL HISTORY: Past Medical History:  Diagnosis Date   Anemia    Arthritis    lower  back, hips, hands   Biliary stricture    Diabetes mellitus ([place])    type 2    Early cataracts, bilateral    Md just watching   Elevated liver enzymes    Family history of  adverse reaction to anesthesia    Daughter hard to wake up   Gallstones    GERD (gastroesophageal reflux disease)    occasional - diet controlled   History of blood transfusion 2018   History of hiatal hernia    HTN (hypertension)    Hyperlipidemia    Hypothyroidism    left renal ca dx'd 2018   renal cancer - left kidney removed, pill chemo x 1 yr   Myocardial infarction ([place]) 1991   no deficits   SVD (spontaneous vaginal delivery)    x 3   Wears glasses     SURGICAL HISTORY: Past Surgical History:  Procedure Laterality Date   BALLOON DILATION N/A 07/23/2018   Procedure: BALLOON DILATION;  Surgeon: Mansouraty, Telford Nab., MD;  Location: [facility];  Service: Gastroenterology;  Laterality: N/A;   BILIARY BRUSHING  08/10/2018   Procedure: BILIARY BRUSHING;  Surgeon: Rush Landmark Telford Nab., MD;  Location: [place];  Service: Gastroenterology;;   BILIARY BRUSHING  11/30/2018   Procedure: BILIARY BRUSHING;  Surgeon: Irving Copas., MD;  Location: [place];  Service: Gastroenterology;;   BILIARY DILATION  08/10/2018   Procedure: BILIARY DILATION;  Surgeon: Irving Copas., MD;  Location: [facility];  Service: Gastroenterology;;   BILIARY DILATION  08/27/2018   Procedure: BILIARY DILATION;  Surgeon: Irving Copas., MD;  Location: [facility];  Service: Gastroenterology;;   BILIARY DILATION  01/24/2020   Procedure: BILIARY DILATION;  Surgeon: Irving Copas., MD;  Location: [place] [place];  Service: Gastroenterology;;   BILIARY STENT PLACEMENT  08/10/2018   Procedure: BILIARY STENT PLACEMENT;  Surgeon: Irving Copas., MD;  Location: [facility];  Service: Gastroenterology;;   BILIARY STENT PLACEMENT  08/27/2018   Procedure: BILIARY STENT PLACEMENT;  Surgeon: Irving Copas., MD;  Location: [place];  Service: Gastroenterology;;   BILIARY STENT PLACEMENT  11/30/2018   Procedure: BILIARY STENT PLACEMENT;  Surgeon: Irving Copas., MD;  Location: [place];  Service: Gastroenterology;;   BIOPSY  07/23/2018   Procedure: BIOPSY;  Surgeon: Irving Copas., MD;  Location: [place];  Service: Gastroenterology;;   BIOPSY  01/24/2020   Procedure: BIOPSY;  Surgeon: Irving Copas., MD;  Location: [facility];  Service: Gastroenterology;;   CHOLECYSTECTOMY N/A 09/02/2018   Procedure: LAPAROSCOPIC CHOLECYSTECTOMY;  Surgeon: Stark Klein, MD;  Location: [place];  Service: General;  Laterality: N/A;   COLONOSCOPY     normal    ENDOSCOPIC RETROGRADE CHOLANGIOPANCREATOGRAPHY (ERCP) WITH PROPOFOL N/A 08/10/2018   Procedure: ENDOSCOPIC RETROGRADE CHOLANGIOPANCREATOGRAPHY (ERCP) WITH PROPOFOL;  Surgeon: Irving Copas., MD;  Location: [facility];  Service: Gastroenterology;  Laterality: N/A;   ENDOSCOPIC RETROGRADE CHOLANGIOPANCREATOGRAPHY (ERCP) WITH PROPOFOL N/A 11/30/2018   Procedure: ENDOSCOPIC RETROGRADE CHOLANGIOPANCREATOGRAPHY (ERCP) WITH PROPOFOL;  Surgeon: Rush Landmark Telford Nab., MD;  Location: [facility];  Service: Gastroenterology;  Laterality: N/A;   ENDOSCOPIC RETROGRADE CHOLANGIOPANCREATOGRAPHY (ERCP) WITH PROPOFOL N/A 01/24/2020   Procedure: ENDOSCOPIC RETROGRADE CHOLANGIOPANCREATOGRAPHY (ERCP) WITH PROPOFOL;  Surgeon: Rush Landmark Telford Nab., MD;  Location: [facility];  Service: Gastroenterology;  Laterality: N/A;   ERCP N/A 07/23/2018   Procedure: ENDOSCOPIC RETROGRADE CHOLANGIOPANCREATOGRAPHY (ERCP);  Surgeon: Irving Copas., MD;  Location: [facility];  Service: Gastroenterology;  Laterality: N/A;   ERCP N/A 08/27/2018  Procedure: ENDOSCOPIC RETROGRADE CHOLANGIOPANCREATOGRAPHY (ERCP);  Surgeon: Irving Copas., MD;  Location: [facility];  Service: Gastroenterology;  Laterality: N/A;   ESOPHAGOGASTRODUODENOSCOPY N/A 01/24/2020   Procedure: ESOPHAGOGASTRODUODENOSCOPY (EGD);  Surgeon: Irving Copas., MD;  Location: [facility];  Service: Gastroenterology;   Laterality: N/A;   ESOPHAGOGASTRODUODENOSCOPY (EGD) WITH PROPOFOL N/A 08/27/2018   Procedure: ESOPHAGOGASTRODUODENOSCOPY (EGD) WITH PROPOFOL;  Surgeon: Rush Landmark Telford Nab., MD;  Location: [facility];  Service: Gastroenterology;  Laterality: N/A;   EUS N/A 08/27/2018   Procedure: ESOPHAGEAL ENDOSCOPIC ULTRASOUND (EUS) RADIAL;  Surgeon: Rush Landmark Telford Nab., MD;  Location: [facility];  Service: Gastroenterology;  Laterality: N/A;   FINE NEEDLE ASPIRATION  08/27/2018   Procedure: FINE NEEDLE ASPIRATION (FNA) LINEAR;  Surgeon: Irving Copas., MD;  Location: [facility];  Service: Gastroenterology;;   Fatima Blank HERNIA REPAIR N/A 06/23/2020   Procedure: [place];  Surgeon: Felicie Morn, MD;  Location[facility];  Service: General;  Laterality: N/A;  ROOM 2 STARTING AT 11:00AM FOR 90 MIN   IR IMAGING GUIDED PORT INSERTION  01/08/2018   LAPAROSCOPIC NEPHRECTOMY Left 08/01/2016   Procedure: LAPAROSCOPIC  RADICAL NEPHRECTOMY/ REPAIR OF UMBILICAL HERNIA;  Surgeon: Raynelle Bring, MD;  Location[facility];  Service: Urology;  Laterality: Left;   REMOVAL OF STONES  07/23/2018   Procedure: REMOVAL OF GALL STONES;  Surgeon: Rush Landmark Telford Nab., MD;  Location: [facility];  Service: Gastroenterology;;   REMOVAL OF STONES  08/10/2018   Procedure: REMOVAL OF STONES;  Surgeon: Irving Copas., MD;  Location: [facility];  Service: Gastroenterology;;   REMOVAL OF STONES  08/27/2018   Procedure: REMOVAL OF STONES;  Surgeon: Irving Copas., MD;  Location: [facility];  Service: Gastroenterology;;   REMOVAL OF STONES  01/24/2020   Procedure: REMOVAL OF STONES;  Surgeon: Irving Copas., MD;  Location: [facility];  Service: Gastroenterology;;   [name]  07/23/2018   Procedure: [name];  Surgeon: Irving Copas., MD;  Location: [facility];  Service: Gastroenterology;;   Lavell Islam REMOVAL  08/27/2018   Procedure: STENT REMOVAL;   Surgeon: Irving Copas., MD;  Location: [facility];  Service: Gastroenterology;;   Lavell Islam REMOVAL  11/30/2018   Procedure: STENT REMOVAL;  Surgeon: Irving Copas., MD;  Location: [facility];  Service: Gastroenterology;;   STENT REMOVAL  01/24/2020   Procedure: STENT REMOVAL;  Surgeon: Irving Copas., MD;  Location: [facility];  Service: Gastroenterology;;   UPPER GI ENDOSCOPY     x 1   VAGINAL PROLAPSE REPAIR  11/18/2019   [facility]    SOCIAL HISTORY: Social History   Socioeconomic History   Marital status: Married    Spouse name: Not on file   Number of children: 3   Years of education: Not on file   Highest education level: Not on file  Occupational History   Occupation: retired  Tobacco Use   Smoking status: Former    Packs/day: 1.00    Years: 8.00    Total pack years: 8.00    Types: Cigarettes    Quit date: 06/18/1964    Years since quitting: 57.5   Smokeless tobacco: Never  Vaping Use   Vaping Use: Never used  Substance and Sexual Activity   Alcohol use: No   Drug use: No   Sexual activity: Not Currently    Birth control/protection: Post-menopausal  Other Topics Concern   Not on file  Social History Narrative   Married.  Three children   Social Determinants  of Health   Financial Resource Strain: Not on file  Food Insecurity: Not on file  Transportation Needs: Not on file  Physical Activity: Not on file  Stress: Not on file  Social Connections: Not on file  Intimate Partner Violence: Not on file    FAMILY HISTORY: Family History  Problem Relation Age of Onset   Heart attack Father 6   Heart disease Brother 52       CABG   Colon cancer Neg Hx    Esophageal cancer Neg Hx    Inflammatory bowel disease Neg Hx    Liver disease Neg Hx    Pancreatic cancer Neg Hx    Rectal cancer Neg Hx    Stomach cancer Neg Hx      ROS 10 Point review of Systems was done is negative except as noted above.  PHYSICAL EXAMINATION Vital  signs reviewed NAD GENERAL:alert, in no acute distress and comfortable SKIN: no acute rashes, no significant lesions EYES: conjunctiva are pink and non-injected, sclera anicteric OROPHARYNX: MMM, no exudates, no oropharyngeal erythema or ulceration NECK: supple, no JVD LYMPH:  no palpable lymphadenopathy in the cervical, axillary or inguinal regions LUNGS: clear to auscultation b/l with normal respiratory effort HEART: regular rate & rhythm ABDOMEN:  normoactive bowel sounds , non tender, not distended. Extremity: no pedal edema PSYCH: alert & oriented x 3 with fluent speech NEURO: no focal motor/sensory deficits  Exam performed in chair.  LABORATORY DATA: .    Latest Ref Rng & Units 12/05/2021    9:11 AM 11/21/2021    9:08 AM 11/07/2021    9:15 AM  CBC  WBC 4.0 - 10.5 K/uL 9.1  8.0  11.1   Hemoglobin 12.0 - 15.0 g/dL 12.6  13.3  12.4   Hematocrit 36.0 - 46.0 % 38.7  41.2  38.6   Platelets 150 - 400 K/uL 250  321  291    .    Latest Ref Rng & Units 12/05/2021    9:11 AM 11/21/2021    9:08 AM 11/07/2021    9:15 AM  CMP  Glucose 70 - 99 mg/dL 191  138  87   BUN 8 - 23 mg/dL 36  30  29   Creatinine 0.44 - 1.00 mg/dL 1.56  1.58  1.71   Sodium 135 - 145 mmol/L 136  139  138   Potassium 3.5 - 5.1 mmol/L 4.6  5.0  4.0   Chloride 98 - 111 mmol/L 112  113  110   CO2 22 - 32 mmol/L '19  20  22   '$ Calcium 8.9 - 10.3 mg/dL 8.8  9.2  9.5   Total Protein 6.5 - 8.1 g/dL 6.4  6.7  6.2   Total Bilirubin 0.3 - 1.2 mg/dL 0.3  0.4  0.5   Alkaline Phos 38 - 126 U/L 79  73  58   AST 15 - 41 U/L 28  105  59   ALT 0 - 44 U/L 43  196  57    RADIOLOGY    CLINICAL DATA:  Subsequent treatment strategy for metastatic renal cell carcinoma.   EXAM: NUCLEAR MEDICINE PET SKULL BASE TO THIGH   TECHNIQUE: 6.32 mCi F-18 FDG was injected intravenously. Full-ring PET imaging was performed from the skull base to thigh after the radiotracer. CT data was obtained and used for attenuation correction and  anatomic localization.   Fasting blood glucose: 159 mg/dl   COMPARISON:  PET-CT 08/29/2020 and 02/09/2020.   FINDINGS:  Mediastinal blood pool activity: SUV max 2.7   NECK:   No hypermetabolic cervical lymph nodes are identified.There are no lesions of the pharyngeal mucosal space. Activity associated with the muscles of phonation is within physiologic limits and similar to previous study.   Incidental CT findings: Bilateral carotid atherosclerosis.   CHEST:   There are no hypermetabolic mediastinal, hilar or axillary lymph nodes. No hypermetabolic pulmonary activity or suspicious nodularity.   Incidental CT findings: Right IJ Port-A-Cath extends to the superior cavoatrial junction. There is diffuse atherosclerosis of the aorta, great vessels and coronary arteries. Mild linear scarring at both lung bases appears unchanged.   ABDOMEN/PELVIS:   There is no hypermetabolic activity within the liver, adrenal glands, spleen or pancreas. There is no hypermetabolic nodal activity. Stable appearance of the right kidney. No hypermetabolic activity within the left nephrectomy bed   Incidental CT findings: Diffuse aortic and branch vessel atherosclerosis, scattered colonic diverticulosis and pelvic floor laxity are again noted.   SKELETON:   There is no hypermetabolic activity to suggest osseous metastatic disease.   Incidental CT findings: Grossly stable sclerotic lesions within the thoracic spine, without hypermetabolic activity. Lower lumbar spondylosis and sacroiliac degenerative changes bilaterally.   IMPRESSION: 1. Stable PET-CT status post left nephrectomy. No evidence of local recurrence or hypermetabolic metastatic disease. 2. Stable incidental findings including coronary and Aortic Atherosclerosis (ICD10-I70.0). Pelvic floor laxity.     Electronically Signed   By: Richardean Sale M.D.   On: 04/03/2021 12:30      ASSESSMENT and PLAN:   81 y.o. 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 PET/CT 06/19/2017 -- consistent with partial metabolic response to treatment.   Rt adrenal gland bx - showed clear cell RCC   S/p CYtoreductive left radical nephrectomy and left adrenal gland resection on 08/01/2016 by Dr Alinda Money.   10/16/17 PET/CT revealed Continued improvement, with the T7 metastatic lesion no longer significantly hypermetabolic. The previous right lower lobe pulmonary nodule is even less apparent, perhaps about 2 mm in diameter today; given that this measured 6 mm on 03/28/2017 this probably represents an effectively treated metastatic lesion. Currently no appreciable hypermetabolic activity is identified to suggest active malignancy. Distended gallbladder with gallbladder wall thickening and gallstones. Correlate clinically in assessing for cholecystitis. Small but abnormal amount of free pelvic fluid, nonspecific. Other imaging findings of potential clinical significance: Chronic ethmoid sinusitis. Aortic Atherosclerosis. Stable 5 mm right middle lobe pulmonary nodule, not hypermetabolic but below sensitive PET-CT size thresholds. Left nephrectomy. Notable pelvic floor laxity with cystocele. Chronic bilateral Sacroiliitis.   04/03/18 PET/CT revealed No evidence for new or progressive hypermetabolic disease on today's study to suggest metastatic progression. 2. Stable appearance of the T7 metastatic lesion without Hypermetabolism. 3. Tiny focus of FDG accumulation identified along the skin of the low right inguinal fold. No associated lesion evident on CT. This may be related to urinary contaminant. 4. Cholelithiasis with similar appearance of diffuse gallbladder wall thickening. 5. Stable 5 mm right middle lobe pulmonary nodule. 6.  Aortic Atherosclerois. 7. Diffuse colonic diverticulosis.    09/18/18 CT A/P revealed  "Common duct stent in place. Improvement to resolution in biliary duct dilatation compared to 08/29/2018. Interval cholecystectomy without acute complication. 2. Left nephrectomy, without evidence of metastatic disease. 3.  Tiny hiatal hernia. 4. Coronary artery atherosclerosis. Aortic Atherosclerosis. 5. Mild limitations secondary to lack of IV  contrast. 6. Marked pelvic floor laxity with cystocele and rectal prolapse".   07/02/2019 PET/CT ([id number]) which revealed "1. 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   #3 Small pulmonary lesions -- 10/16/17 PET/CT showed pulmonary less apparent than 03/28/17 PET/CT, decreasing from 47m to 213mdiameter.   MRI brain shows no evidence of metastatic disease   #4 T7/8 Bone metastases - received Xgeva every 4 weeks from May 2018 to June 2019. 10/16/17 PET/CT revealed improvement with T7 metastatic lesion no longer significantly hypermetabolic.  -on XgMarchelle Folks #5 ?liver mets- rpt PET/CT from 10/16/2017 shows no overt evidence of metastatic disease in the liver.   #6 Cchronic renal insufficiency Creatine stable   #7 Hemorrhoids -chronic with some bleeding --Recommended Sitz bath and OTC Anusol or Nupercaine for her hemorrhoid relief. -f/u with PCP for continued mx   #8 Hypothyroidism/Adrenal insufficiency/Diabetes -Continue being followed by Dr. KeBuddy Duty #9 h/o HTN - ?control. Patient tends to be anxious and has higher blood pressures in the clinic.   #10 CAD - positive cardiac nuclear stress test Following with cardiology and nephrology    #11 h/o Choledocholithiasis and cholelithiasis 07/10/18 USKoreabdomen revealed Biliary duct dilatation with 8 mm calculus at the level of the ampulla in the distal common bile duct. 2. Cholelithiasis. No gallbladder wall thickening or pericholecystic fluid. 3. Appearance of the liver raises concern for  underlying hepatic cirrhosis. No focal liver lesions are demonstrable. 4.  Left kidney absent. 5.  Small cysts in right kidney. 6.  Aortic Atherosclerosis.   S/p ERCP on 07/23/18 with Dr. GaValarie Merinoansouraty   09/02/18 Cytopathology from ERCP was suspicious for malignant cells in bile duct, but was not definitive, other two findings were benign.   # 12 Peripheral arterial disease -- following with cardiology/vascular surgery  PLAN:   -Labs done today were reviewed in detail with the patient CBC stable with a normal hemoglobin now of 12.6 normal platelets of 250k CMP showsStable liver and kidney function.  Abnormal liver enzymes normalized after patient stopped Cymbalta. PET CT scan done on 11/09/2021 showed no overt radiographic evidence of progressive/recurrent metastatic disease from her renal cell carcinoma. Patient has no new symptoms suggestive of progression of her metastatic renal cell carcinoma at this time. No notable toxicities from her current immunotherapy We will continue nivolumab as per scheduled plan. She is continue to follow with pain management and primary care physician.  FOLLOW UP: -Continue nivolumab with port flush and labs every 2 weeks x 8 doses Return to clinic with Dr. KaIrene Limbon 6 weeks with her scheduled treatment  The total time spent in the appointment was 31 minutes*.  All of the patient's questions were answered with apparent satisfaction. The patient knows to call the clinic with any problems, questions or concerns.   GaSullivan LoneD MS AAHIVMS [facility]/Oncology Physician [facility].*Total Encounter Time as defined by the Centers for Medicare and Medicaid Services includes, in addition to the face-to-face time of a patient visit (documented in the note above) non-face-to-face time: obtaining and reviewing outside history, ordering and reviewing medications, tests or procedures, care coordination (communications with other health care  professionals or caregivers) and documentation in the medical record.

## 2021-12-12 ENCOUNTER — Encounter: Payer: Self-pay | Admitting: Hematology

## 2021-12-17 ENCOUNTER — Other Ambulatory Visit: Payer: Self-pay

## 2021-12-18 ENCOUNTER — Other Ambulatory Visit: Payer: Self-pay | Admitting: *Deleted

## 2021-12-18 DIAGNOSIS — C642 Malignant neoplasm of left kidney, except renal pelvis: Secondary | ICD-10-CM

## 2021-12-19 ENCOUNTER — Inpatient Hospital Stay: Payer: Medicare Other

## 2021-12-19 ENCOUNTER — Other Ambulatory Visit: Payer: Self-pay

## 2021-12-19 VITALS — BP 143/61 | HR 61 | Temp 97.7°F | Resp 16

## 2021-12-19 DIAGNOSIS — C642 Malignant neoplasm of left kidney, except renal pelvis: Secondary | ICD-10-CM

## 2021-12-19 DIAGNOSIS — C7951 Secondary malignant neoplasm of bone: Secondary | ICD-10-CM

## 2021-12-19 DIAGNOSIS — Z7189 Other specified counseling: Secondary | ICD-10-CM

## 2021-12-19 DIAGNOSIS — Z5112 Encounter for antineoplastic immunotherapy: Secondary | ICD-10-CM | POA: Diagnosis not present

## 2021-12-19 DIAGNOSIS — Z95828 Presence of other vascular implants and grafts: Secondary | ICD-10-CM

## 2021-12-19 LAB — CBC WITH DIFFERENTIAL (CANCER CENTER ONLY)
Abs Immature Granulocytes: 0.11 10*3/uL — ABNORMAL HIGH (ref 0.00–0.07)
Basophils Absolute: 0.1 10*3/uL (ref 0.0–0.1)
Basophils Relative: 1 %
Eosinophils Absolute: 0.2 10*3/uL (ref 0.0–0.5)
Eosinophils Relative: 2 %
HCT: 38.9 % (ref 36.0–46.0)
Hemoglobin: 12.6 g/dL (ref 12.0–15.0)
Immature Granulocytes: 1 %
Lymphocytes Relative: 20 %
Lymphs Abs: 1.7 10*3/uL (ref 0.7–4.0)
MCH: 28.7 pg (ref 26.0–34.0)
MCHC: 32.4 g/dL (ref 30.0–36.0)
MCV: 88.6 fL (ref 80.0–100.0)
Monocytes Absolute: 1.2 10*3/uL — ABNORMAL HIGH (ref 0.1–1.0)
Monocytes Relative: 14 %
Neutro Abs: 5.3 10*3/uL (ref 1.7–7.7)
Neutrophils Relative %: 62 %
Platelet Count: 286 10*3/uL (ref 150–400)
RBC: 4.39 MIL/uL (ref 3.87–5.11)
RDW: 16.4 % — ABNORMAL HIGH (ref 11.5–15.5)
WBC Count: 8.5 10*3/uL (ref 4.0–10.5)
nRBC: 0 % (ref 0.0–0.2)

## 2021-12-19 LAB — CMP (CANCER CENTER ONLY)
ALT: 15 U/L (ref 0–44)
AST: 17 U/L (ref 15–41)
Albumin: 3.8 g/dL (ref 3.5–5.0)
Alkaline Phosphatase: 51 U/L (ref 38–126)
Anion gap: 5 (ref 5–15)
BUN: 40 mg/dL — ABNORMAL HIGH (ref 8–23)
CO2: 19 mmol/L — ABNORMAL LOW (ref 22–32)
Calcium: 8.6 mg/dL — ABNORMAL LOW (ref 8.9–10.3)
Chloride: 116 mmol/L — ABNORMAL HIGH (ref 98–111)
Creatinine: 1.77 mg/dL — ABNORMAL HIGH (ref 0.44–1.00)
GFR, Estimated: 29 mL/min — ABNORMAL LOW (ref >60)
Glucose, Bld: 117 mg/dL — ABNORMAL HIGH (ref 70–99)
Potassium: 4.4 mmol/L (ref 3.5–5.1)
Sodium: 140 mmol/L (ref 135–145)
Total Bilirubin: 0.4 mg/dL (ref 0.3–1.2)
Total Protein: 6 g/dL — ABNORMAL LOW (ref 6.5–8.1)

## 2021-12-19 MED ORDER — SODIUM CHLORIDE 0.9 % IV SOLN: Freq: Once | INTRAVENOUS | Status: AC

## 2021-12-19 MED ORDER — SODIUM CHLORIDE 0.9% FLUSH
10.0000 mL | Freq: Once | INTRAVENOUS | Status: AC | PRN
  Administered 2021-12-19: 10 mL

## 2021-12-19 MED ORDER — HEPARIN SOD (PORK) LOCK FLUSH 100 UNIT/ML IV SOLN
500.0000 [IU] | Freq: Once | INTRAVENOUS | Status: AC | PRN
  Administered 2021-12-19: 500 [IU]

## 2021-12-19 MED ORDER — SODIUM CHLORIDE 0.9 % IV SOLN: INTRAVENOUS | Status: AC

## 2021-12-19 MED ORDER — LORATADINE 10 MG PO TABS
10.0000 mg | ORAL_TABLET | Freq: Once | ORAL | Status: AC
  Administered 2021-12-19: 10 mg via ORAL
  Filled 2021-12-19: qty 1

## 2021-12-19 MED ORDER — ACETAMINOPHEN 325 MG PO TABS
650.0000 mg | ORAL_TABLET | Freq: Once | ORAL | Status: AC
  Administered 2021-12-19: 650 mg via ORAL
  Filled 2021-12-19: qty 2

## 2021-12-19 MED ORDER — SODIUM CHLORIDE 0.9% FLUSH
10.0000 mL | Freq: Once | INTRAVENOUS | Status: AC
  Administered 2021-12-19: 10 mL

## 2021-12-19 MED ORDER — SODIUM CHLORIDE 0.9 % IV SOLN
240.0000 mg | Freq: Once | INTRAVENOUS | Status: AC
  Administered 2021-12-19: 240 mg via INTRAVENOUS
  Filled 2021-12-19: qty 24

## 2021-12-19 MED ORDER — SODIUM CHLORIDE 0.9 % IV SOLN
300.0000 mg | Freq: Once | INTRAVENOUS | Status: AC
  Administered 2021-12-19: 300 mg via INTRAVENOUS
  Filled 2021-12-19: qty 300

## 2021-12-19 NOTE — Patient Instructions (Signed)
[facility]   Discharge Instructions: Thank you for choosing [facility] to provide your oncology and hematology care.   If you have a lab appointment with the Cancer Center, please go directly to the Cancer Center and check in at the registration area.   Wear comfortable clothing and clothing appropriate for easy access to any Portacath or PICC line.   We strive to give you quality time with your provider. You may need to reschedule your appointment if you arrive late (15 or more minutes).  Arriving late affects you and other patients whose appointments are after yours.  Also, if you miss three or more appointments without notifying the office, you may be dismissed from the clinic at the provider's discretion.      For prescription refill requests, have your pharmacy contact our office and allow 72 hours for refills to be completed.    Today you received the following chemotherapy and/or immunotherapy agents: nivolumab      To help prevent nausea and vomiting after your treatment, we encourage you to take your nausea medication as directed.  BELOW ARE SYMPTOMS THAT SHOULD BE REPORTED IMMEDIATELY: *FEVER GREATER THAN 100.4 F (38 C) OR HIGHER *CHILLS OR SWEATING *NAUSEA AND VOMITING THAT IS NOT CONTROLLED WITH YOUR NAUSEA MEDICATION *UNUSUAL SHORTNESS OF BREATH *UNUSUAL BRUISING OR BLEEDING *URINARY PROBLEMS (pain or burning when urinating, or frequent urination) *BOWEL PROBLEMS (unusual diarrhea, constipation, pain near the anus) TENDERNESS IN MOUTH AND THROAT WITH OR WITHOUT PRESENCE OF ULCERS (sore throat, sores in mouth, or a toothache) UNUSUAL RASH, SWELLING OR PAIN  UNUSUAL VAGINAL DISCHARGE OR ITCHING   Items with * indicate a potential emergency and should be followed up as soon as possible or go to the Emergency Department if any problems should occur.  Please show the CHEMOTHERAPY ALERT CARD or IMMUNOTHERAPY ALERT CARD at check-in  to the Emergency Department and triage nurse.  Should you have questions after your visit or need to cancel or reschedule your appointment, please contact [facility]  Dept: [phone]  and follow the prompts.  Office hours are 8:00 a.m. to 4:30 p.m. Monday - Friday. Please note that voicemails left after 4:00 p.m. may not be returned until the following business day.  We are closed weekends and major holidays. You have access to a nurse at all times for urgent questions. Please call the main number to the clinic Dept: [phone] and follow the prompts.   For any non-urgent questions, you may also contact your provider using MyChart. We now offer e-Visits for anyone 18 and older to request care online for non-urgent symptoms. For details visit [email or web address].   Also download the MyChart app! Go to the app store, search "MyChart", open the app, select [facility], and log in with your MyChart username and password.  Masks are optional in the cancer centers. If you would like for your care team to wear a mask while they are taking care of you, please let them know. You may have one support person who is at least 80 years old accompany you for your appointments. 

## 2021-12-21 ENCOUNTER — Other Ambulatory Visit: Payer: Self-pay

## 2021-12-31 ENCOUNTER — Other Ambulatory Visit: Payer: Self-pay | Admitting: *Deleted

## 2021-12-31 DIAGNOSIS — C642 Malignant neoplasm of left kidney, except renal pelvis: Secondary | ICD-10-CM

## 2022-01-02 ENCOUNTER — Inpatient Hospital Stay: Payer: Medicare Other

## 2022-01-02 ENCOUNTER — Inpatient Hospital Stay: Payer: Medicare Other | Attending: Hematology

## 2022-01-02 ENCOUNTER — Other Ambulatory Visit: Payer: Self-pay | Admitting: Hematology

## 2022-01-02 ENCOUNTER — Inpatient Hospital Stay: Payer: Medicare Other | Admitting: Dietician

## 2022-01-02 ENCOUNTER — Other Ambulatory Visit: Payer: Self-pay

## 2022-01-02 VITALS — BP 122/57 | HR 58 | Temp 97.7°F | Resp 18 | Ht 59.0 in | Wt 124.5 lb

## 2022-01-02 DIAGNOSIS — Z5112 Encounter for antineoplastic immunotherapy: Secondary | ICD-10-CM | POA: Insufficient documentation

## 2022-01-02 DIAGNOSIS — C642 Malignant neoplasm of left kidney, except renal pelvis: Secondary | ICD-10-CM

## 2022-01-02 DIAGNOSIS — Z95828 Presence of other vascular implants and grafts: Secondary | ICD-10-CM

## 2022-01-02 DIAGNOSIS — Z7189 Other specified counseling: Secondary | ICD-10-CM

## 2022-01-02 DIAGNOSIS — C649 Malignant neoplasm of unspecified kidney, except renal pelvis: Secondary | ICD-10-CM | POA: Insufficient documentation

## 2022-01-02 DIAGNOSIS — C7951 Secondary malignant neoplasm of bone: Secondary | ICD-10-CM | POA: Diagnosis not present

## 2022-01-02 LAB — CMP (CANCER CENTER ONLY)
ALT: 46 U/L — ABNORMAL HIGH (ref 0–44)
AST: 26 U/L (ref 15–41)
Albumin: 3.7 g/dL (ref 3.5–5.0)
Alkaline Phosphatase: 69 U/L (ref 38–126)
Anion gap: 4 — ABNORMAL LOW (ref 5–15)
BUN: 32 mg/dL — ABNORMAL HIGH (ref 8–23)
CO2: 21 mmol/L — ABNORMAL LOW (ref 22–32)
Calcium: 9.1 mg/dL (ref 8.9–10.3)
Chloride: 114 mmol/L — ABNORMAL HIGH (ref 98–111)
Creatinine: 1.74 mg/dL — ABNORMAL HIGH (ref 0.44–1.00)
GFR, Estimated: 29 mL/min — ABNORMAL LOW (ref >60)
Glucose, Bld: 168 mg/dL — ABNORMAL HIGH (ref 70–99)
Potassium: 4.1 mmol/L (ref 3.5–5.1)
Sodium: 139 mmol/L (ref 135–145)
Total Bilirubin: 0.4 mg/dL (ref 0.3–1.2)
Total Protein: 6.1 g/dL — ABNORMAL LOW (ref 6.5–8.1)

## 2022-01-02 LAB — CBC WITH DIFFERENTIAL (CANCER CENTER ONLY)
Abs Immature Granulocytes: 0.13 10*3/uL — ABNORMAL HIGH (ref 0.00–0.07)
Basophils Absolute: 0.1 10*3/uL (ref 0.0–0.1)
Basophils Relative: 1 %
Eosinophils Absolute: 0.2 10*3/uL (ref 0.0–0.5)
Eosinophils Relative: 3 %
HCT: 40.6 % (ref 36.0–46.0)
Hemoglobin: 13.2 g/dL (ref 12.0–15.0)
Immature Granulocytes: 1 %
Lymphocytes Relative: 13 %
Lymphs Abs: 1.1 10*3/uL (ref 0.7–4.0)
MCH: 30.1 pg (ref 26.0–34.0)
MCHC: 32.5 g/dL (ref 30.0–36.0)
MCV: 92.5 fL (ref 80.0–100.0)
Monocytes Absolute: 1 10*3/uL (ref 0.1–1.0)
Monocytes Relative: 11 %
Neutro Abs: 6.5 10*3/uL (ref 1.7–7.7)
Neutrophils Relative %: 71 %
Platelet Count: 243 10*3/uL (ref 150–400)
RBC: 4.39 MIL/uL (ref 3.87–5.11)
RDW: 15.8 % — ABNORMAL HIGH (ref 11.5–15.5)
WBC Count: 9.1 10*3/uL (ref 4.0–10.5)
nRBC: 0 % (ref 0.0–0.2)

## 2022-01-02 MED ORDER — HEPARIN SOD (PORK) LOCK FLUSH 100 UNIT/ML IV SOLN
500.0000 [IU] | Freq: Once | INTRAVENOUS | Status: AC | PRN
  Administered 2022-01-02: 500 [IU]

## 2022-01-02 MED ORDER — SODIUM CHLORIDE 0.9% FLUSH
10.0000 mL | INTRAVENOUS | Status: DC | PRN
  Administered 2022-01-02: 10 mL

## 2022-01-02 MED ORDER — SODIUM CHLORIDE 0.9% FLUSH
10.0000 mL | Freq: Once | INTRAVENOUS | Status: AC
  Administered 2022-01-02: 10 mL

## 2022-01-02 MED ORDER — SODIUM CHLORIDE 0.9 % IV SOLN: INTRAVENOUS | Status: AC

## 2022-01-02 MED ORDER — SODIUM CHLORIDE 0.9 % IV SOLN
240.0000 mg | Freq: Once | INTRAVENOUS | Status: AC
  Administered 2022-01-02: 240 mg via INTRAVENOUS
  Filled 2022-01-02: qty 24

## 2022-01-02 NOTE — Patient Instructions (Signed)
[facility]   Discharge Instructions: Thank you for choosing [facility] to provide your oncology and hematology care.   If you have a lab appointment with the Cancer Center, please go directly to the Cancer Center and check in at the registration area.   Wear comfortable clothing and clothing appropriate for easy access to any Portacath or PICC line.   We strive to give you quality time with your provider. You may need to reschedule your appointment if you arrive late (15 or more minutes).  Arriving late affects you and other patients whose appointments are after yours.  Also, if you miss three or more appointments without notifying the office, you may be dismissed from the clinic at the provider's discretion.      For prescription refill requests, have your pharmacy contact our office and allow 72 hours for refills to be completed.    Today you received the following chemotherapy and/or immunotherapy agents: nivolumab      To help prevent nausea and vomiting after your treatment, we encourage you to take your nausea medication as directed.  BELOW ARE SYMPTOMS THAT SHOULD BE REPORTED IMMEDIATELY: *FEVER GREATER THAN 100.4 F (38 C) OR HIGHER *CHILLS OR SWEATING *NAUSEA AND VOMITING THAT IS NOT CONTROLLED WITH YOUR NAUSEA MEDICATION *UNUSUAL SHORTNESS OF BREATH *UNUSUAL BRUISING OR BLEEDING *URINARY PROBLEMS (pain or burning when urinating, or frequent urination) *BOWEL PROBLEMS (unusual diarrhea, constipation, pain near the anus) TENDERNESS IN MOUTH AND THROAT WITH OR WITHOUT PRESENCE OF ULCERS (sore throat, sores in mouth, or a toothache) UNUSUAL RASH, SWELLING OR PAIN  UNUSUAL VAGINAL DISCHARGE OR ITCHING   Items with * indicate a potential emergency and should be followed up as soon as possible or go to the Emergency Department if any problems should occur.  Please show the CHEMOTHERAPY ALERT CARD or IMMUNOTHERAPY ALERT CARD at check-in  to the Emergency Department and triage nurse.  Should you have questions after your visit or need to cancel or reschedule your appointment, please contact [facility]  Dept: [phone]  and follow the prompts.  Office hours are 8:00 a.m. to 4:30 p.m. Monday - Friday. Please note that voicemails left after 4:00 p.m. may not be returned until the following business day.  We are closed weekends and major holidays. You have access to a nurse at all times for urgent questions. Please call the main number to the clinic Dept: [phone] and follow the prompts.   For any non-urgent questions, you may also contact your provider using MyChart. We now offer e-Visits for anyone 18 and older to request care online for non-urgent symptoms. For details visit [email or web address].   Also download the MyChart app! Go to the app store, search "MyChart", open the app, select [facility], and log in with your MyChart username and password.  Masks are optional in the cancer centers. If you would like for your care team to wear a mask while they are taking care of you, please let them know. You may have one support person who is at least 80 years old accompany you for your appointments. 

## 2022-01-02 NOTE — Progress Notes (Signed)
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 She is receiving nivolumab q14d and xgeva q6w.   Met with patient during infusion. She reports she has been eating well. Friends from church have been bringing over pies and cakes in the last few days in celebration of her husbands birthday. Patient recalls yogurt, a few bites of soup, and coffee prior to visit. She is making pork roast and beans for supper tonight. Patient endorses fatigue. 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 Patient prepares all meals, drives to appointments, takes care of 2 story home. She stays in a bedroom downstairs but her husband sleeps upstairs. Patient helps her husband get to bed each night. Says she literally has to crawl the last few stairs due to her own weakness. Patient is not interested in having hospital bed brought in for husband to stay down stairs. She reports knowing "too much" about the restaurant providing Meals On Wheels to [place] residents. The food is terrible.   Medications: reviewed   Labs: glucose 168, BUN 32, Cr 1.74  Anthropometrics: Weight 124 lb 8 oz today increased  8/16 - 120 lb 8 oz 7/5 - 120 lb 8 oz    NUTRITION DIAGNOSIS: Inadequate oral intake stable    INTERVENTION:  Reviewed foods with protein, encouraged protein source with meals and snacks Suggested trying ONS for additional calories/protein - samples of Glucerna, Ensure, Boost as well as coupons provided Referral to LCSW for additional [employer] suggestions, psychosocial needs    MONITORING, EVALUATION, GOAL: weight trends, intake    NEXT VISIT: To be scheduled as needed

## 2022-01-02 NOTE — Progress Notes (Signed)
Patient states that she had vomiting and moderate abdominal pain following last iron transfusion and does not wish to receive it anymore. [name] made aware. Ok to omit iron transfusion today.

## 2022-01-15 ENCOUNTER — Other Ambulatory Visit: Payer: Self-pay

## 2022-01-15 ENCOUNTER — Encounter: Payer: Self-pay | Admitting: Hematology

## 2022-01-15 DIAGNOSIS — Z9181 History of falling: Secondary | ICD-10-CM | POA: Insufficient documentation

## 2022-01-15 DIAGNOSIS — E11621 Type 2 diabetes mellitus with foot ulcer: Secondary | ICD-10-CM | POA: Insufficient documentation

## 2022-01-15 DIAGNOSIS — R42 Dizziness and giddiness: Secondary | ICD-10-CM | POA: Insufficient documentation

## 2022-01-15 DIAGNOSIS — C642 Malignant neoplasm of left kidney, except renal pelvis: Secondary | ICD-10-CM

## 2022-01-15 DIAGNOSIS — N189 Chronic kidney disease, unspecified: Secondary | ICD-10-CM | POA: Insufficient documentation

## 2022-01-15 DIAGNOSIS — E034 Atrophy of thyroid (acquired): Secondary | ICD-10-CM | POA: Insufficient documentation

## 2022-01-15 DIAGNOSIS — R911 Solitary pulmonary nodule: Secondary | ICD-10-CM | POA: Insufficient documentation

## 2022-01-15 DIAGNOSIS — M179 Osteoarthritis of knee, unspecified: Secondary | ICD-10-CM | POA: Insufficient documentation

## 2022-01-15 DIAGNOSIS — R300 Dysuria: Secondary | ICD-10-CM | POA: Insufficient documentation

## 2022-01-16 ENCOUNTER — Inpatient Hospital Stay: Payer: Medicare Other

## 2022-01-16 ENCOUNTER — Inpatient Hospital Stay (HOSPITAL_BASED_OUTPATIENT_CLINIC_OR_DEPARTMENT_OTHER): Payer: Medicare Other | Admitting: Hematology

## 2022-01-16 VITALS — BP 130/53 | HR 56 | Temp 98.0°F | Resp 16 | Ht 59.0 in | Wt 120.8 lb

## 2022-01-16 VITALS — BP 123/82 | HR 68 | Temp 97.8°F | Resp 18

## 2022-01-16 DIAGNOSIS — Z5112 Encounter for antineoplastic immunotherapy: Secondary | ICD-10-CM

## 2022-01-16 DIAGNOSIS — Z95828 Presence of other vascular implants and grafts: Secondary | ICD-10-CM

## 2022-01-16 DIAGNOSIS — C642 Malignant neoplasm of left kidney, except renal pelvis: Secondary | ICD-10-CM | POA: Diagnosis not present

## 2022-01-16 DIAGNOSIS — C7951 Secondary malignant neoplasm of bone: Secondary | ICD-10-CM

## 2022-01-16 DIAGNOSIS — Z7189 Other specified counseling: Secondary | ICD-10-CM

## 2022-01-16 LAB — CBC WITH DIFFERENTIAL (CANCER CENTER ONLY)
Abs Immature Granulocytes: 0.12 10*3/uL — ABNORMAL HIGH (ref 0.00–0.07)
Basophils Absolute: 0.1 10*3/uL (ref 0.0–0.1)
Basophils Relative: 1 %
Eosinophils Absolute: 0.3 10*3/uL (ref 0.0–0.5)
Eosinophils Relative: 3 %
HCT: 41.4 % (ref 36.0–46.0)
Hemoglobin: 13.7 g/dL (ref 12.0–15.0)
Immature Granulocytes: 1 %
Lymphocytes Relative: 13 %
Lymphs Abs: 1.3 10*3/uL (ref 0.7–4.0)
MCH: 29.9 pg (ref 26.0–34.0)
MCHC: 33.1 g/dL (ref 30.0–36.0)
MCV: 90.4 fL (ref 80.0–100.0)
Monocytes Absolute: 1 10*3/uL (ref 0.1–1.0)
Monocytes Relative: 10 %
Neutro Abs: 7.4 10*3/uL (ref 1.7–7.7)
Neutrophils Relative %: 72 %
Platelet Count: 288 10*3/uL (ref 150–400)
RBC: 4.58 MIL/uL (ref 3.87–5.11)
RDW: 15.6 % — ABNORMAL HIGH (ref 11.5–15.5)
WBC Count: 10.1 10*3/uL (ref 4.0–10.5)
nRBC: 0 % (ref 0.0–0.2)

## 2022-01-16 LAB — CMP (CANCER CENTER ONLY)
ALT: 24 U/L (ref 0–44)
AST: 22 U/L (ref 15–41)
Albumin: 3.9 g/dL (ref 3.5–5.0)
Alkaline Phosphatase: 59 U/L (ref 38–126)
Anion gap: 7 (ref 5–15)
BUN: 36 mg/dL — ABNORMAL HIGH (ref 8–23)
CO2: 19 mmol/L — ABNORMAL LOW (ref 22–32)
Calcium: 8.8 mg/dL — ABNORMAL LOW (ref 8.9–10.3)
Chloride: 114 mmol/L — ABNORMAL HIGH (ref 98–111)
Creatinine: 1.76 mg/dL — ABNORMAL HIGH (ref 0.44–1.00)
GFR, Estimated: 29 mL/min — ABNORMAL LOW (ref >60)
Glucose, Bld: 163 mg/dL — ABNORMAL HIGH (ref 70–99)
Potassium: 4.1 mmol/L (ref 3.5–5.1)
Sodium: 140 mmol/L (ref 135–145)
Total Bilirubin: 0.5 mg/dL (ref 0.3–1.2)
Total Protein: 6.5 g/dL (ref 6.5–8.1)

## 2022-01-16 MED ORDER — SODIUM CHLORIDE 0.9 % IV SOLN: INTRAVENOUS | Status: AC

## 2022-01-16 MED ORDER — SODIUM CHLORIDE 0.9% FLUSH
10.0000 mL | Freq: Once | INTRAVENOUS | Status: AC
  Administered 2022-01-16: 10 mL

## 2022-01-16 MED ORDER — SODIUM CHLORIDE 0.9% FLUSH
10.0000 mL | INTRAVENOUS | Status: DC | PRN
  Administered 2022-01-16: 10 mL

## 2022-01-16 MED ORDER — SODIUM CHLORIDE 0.9 % IV SOLN
240.0000 mg | Freq: Once | INTRAVENOUS | Status: AC
  Administered 2022-01-16: 240 mg via INTRAVENOUS
  Filled 2022-01-16: qty 24

## 2022-01-16 MED ORDER — HEPARIN SOD (PORK) LOCK FLUSH 100 UNIT/ML IV SOLN: 500.0000 [IU] | Freq: Once | INTRAVENOUS | Status: DC

## 2022-01-16 MED ORDER — HEPARIN SOD (PORK) LOCK FLUSH 100 UNIT/ML IV SOLN
500.0000 [IU] | Freq: Once | INTRAVENOUS | Status: AC | PRN
  Administered 2022-01-16: 500 [IU]

## 2022-01-16 MED ORDER — DENOSUMAB 120 MG/1.7ML ~~LOC~~ SOLN
120.0000 mg | Freq: Once | SUBCUTANEOUS | Status: AC
  Administered 2022-01-16: 120 mg via SUBCUTANEOUS
  Filled 2022-01-16: qty 1.7

## 2022-01-16 NOTE — Patient Instructions (Signed)
[facility]   Discharge Instructions: Thank you for choosing [facility] to provide your oncology and hematology care.   If you have a lab appointment with the [facility], please go directly to the [facility] and check in at the registration area.   Wear comfortable clothing and clothing appropriate for easy access to any Portacath or PICC line.   We strive to give you quality time with your provider. You may need to reschedule your appointment if you arrive late (15 or more minutes).  Arriving late affects you and other patients whose appointments are after yours.  Also, if you miss three or more appointments without notifying the office, you may be dismissed from the clinic at the provider's discretion.      For prescription refill requests, have your pharmacy contact our office and allow 72 hours for refills to be completed.    Today you received the following chemotherapy and/or immunotherapy agents: nivolumab, xgeva     To help prevent nausea and vomiting after your treatment, we encourage you to take your nausea medication as directed.  BELOW ARE SYMPTOMS THAT SHOULD BE REPORTED IMMEDIATELY: *FEVER GREATER THAN 100.4 F (38 C) OR HIGHER *CHILLS OR SWEATING *NAUSEA AND VOMITING THAT IS NOT CONTROLLED WITH YOUR NAUSEA MEDICATION *UNUSUAL SHORTNESS OF BREATH *UNUSUAL BRUISING OR BLEEDING *URINARY PROBLEMS (pain or burning when urinating, or frequent urination) *BOWEL PROBLEMS (unusual diarrhea, constipation, pain near the anus) TENDERNESS IN MOUTH AND THROAT WITH OR WITHOUT PRESENCE OF ULCERS (sore throat, sores in mouth, or a toothache) UNUSUAL RASH, SWELLING OR PAIN  UNUSUAL VAGINAL DISCHARGE OR ITCHING   Items with * indicate a potential emergency and should be followed up as soon as possible or go to the Emergency Department if any problems should occur.  Please show the CHEMOTHERAPY ALERT CARD or IMMUNOTHERAPY ALERT CARD at  check-in to the Emergency Department and triage nurse.  Should you have questions after your visit or need to cancel or reschedule your appointment, please contact [facility]  Dept: [phone]  and follow the prompts.  Office hours are 8:00 a.m. to 4:30 p.m. Monday - Friday. Please note that voicemails left after 4:00 p.m. may not be returned until the following business day.  We are closed weekends and major holidays. You have access to a nurse at all times for urgent questions. Please call the main number to the clinic Dept: [phone] and follow the prompts.   For any non-urgent questions, you may also contact your provider using MyChart. We now offer e-Visits for anyone 103 and older to request care online for non-urgent symptoms. For details visit [email or web address].   Also download the MyChart app! Go to the app store, search "MyChart", open the app, select [facility], and log in with your MyChart username and password.  Masks are optional in the cancer centers. If you would like for your care team to wear a mask while they are taking care of you, please let them know. You may have one support person who is at least 80 years old accompany you for your appointments.

## 2022-01-16 NOTE — Patient Instructions (Signed)

## 2022-01-16 NOTE — Progress Notes (Signed)
Ok to give [place] today with CCa 8.88 per MD.  Acquanetta Belling, RPH, BCPS, BCOP 01/16/2022 10:59 AM

## 2022-01-21 NOTE — Progress Notes (Signed)
[facility] Follow up:   DOS 01/16/2022  PCP [name], MD [address] [place] [address]  CC:   Follow-up for continued evaluation and management of metastatic renal cell carcinoma   DIAGNOSIS: metastatic renal cell carcinoma currently in NED status  SUMMARY OF ONCOLOGIC HISTORY: Oncology History  Cancer, 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[place])  01/10/2017 Initial Diagnosis   Cancer, 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[place])   01/02/2018 -  Chemotherapy   Patient is on Treatment Plan : RENAL CELL CARCINOMA NIVOLUMAB Q14D     Renal cell carcinoma, left ([place])  04/11/2017 Initial Diagnosis   Renal cell carcinoma ([place])   01/02/2018 -  Chemotherapy   The patient had nivolumab (OPDIVO) 240 mg in sodium chloride 0.9 % 100 mL chemo infusion, 240 mg, Intravenous, Once, 47 of 50 cycles Administration: 240 mg (01/02/2018), 240 mg (01/15/2018), 240 mg (01/30/2018), 240 mg (02/13/2018), 240 mg (02/27/2018), 240 mg (03/13/2018), 240 mg (03/27/2018), 240 mg (04/10/2018), 240 mg (04/24/2018), 240 mg (05/08/2018), 240 mg (05/22/2018), 240 mg (06/05/2018), 240 mg (06/19/2018), 240 mg (08/28/2018), 240 mg (09/11/2018), 240 mg (10/09/2018), 240 mg (10/22/2018), 240 mg (11/06/2018), 240 mg (11/20/2018), 240 mg (12/04/2018), 240 mg (12/18/2018), 240 mg (01/01/2019), 240 mg (01/15/2019), 240 mg (01/29/2019), 240 mg (02/12/2019), 240 mg (02/26/2019), 240 mg (03/12/2019), 240 mg (03/26/2019), 240 mg (04/09/2019), 240 mg (04/28/2019), 240 mg (05/12/2019), 240 mg (05/27/2019), 240 mg (06/09/2019), 240 mg (06/23/2019), 240 mg (07/07/2019), 240 mg (07/21/2019), 240 mg (08/04/2019), 240 mg (08/18/2019), 240 mg (09/03/2019), 240 mg (09/15/2019), 240 mg (09/29/2019), 240 mg (10/12/2019), 240 mg (10/27/2019), 240 mg (11/10/2019), 240 mg (12/08/2019), 240 mg (12/22/2019), 240 mg (01/19/2020)  for chemotherapy treatment.      CURRENT THERAPY: metastatic renal cell carcinoma, [place] See and Nivolumab  INTERVAL HISTORY:  Viriginia Amendola Schleich 81 y.o. female is here for  continued evaluation and management of metastatic renal cell carcinoma. She reports She is doing well with no new symptoms or concerns.  She notes continued chronic lower extremity discomfort from peripheral arterial disease and neuropathy.  No acute new focal symptoms. No urinary changes or hematuria. No new chest pain or shortness of breath. No abdominal pain or distention. No new focal neurological deficits. No other new or acute focal symptoms.  Labs done today were reviewed in detail with the patient.  Patient Active Problem List   Diagnosis Date Noted   Atrophy of thyroid (acquired) 01/15/2022   At risk for falls 01/15/2022   CKD (chronic kidney disease) 01/15/2022   Diabetic ulcer of foot with fat layer exposed ([place]) 01/15/2022   Dizziness 01/15/2022   Dysuria 01/15/2022   Knee osteoarthritis 01/15/2022   Pulmonary nodule 01/15/2022   Type 2 diabetes mellitus ([place]) 08/15/2021   Incisional hernia 06/23/2020   Gastritis and gastroduodenitis 02/26/2020   Rectal discomfort 02/26/2020   History of colonic polyps 02/26/2020   Ventral hernia 11/10/2019   History of cholecystectomy 07/17/2019   Degeneration of lumbar intervertebral disc 11/12/2018   Anemia 10/10/2018   Chronic cholecystitis 09/04/2018   Acute cholecystitis 08/29/2018   Biliary stricture 08/23/2018   Medicare annual wellness visit, subsequent 08/14/2018   CAD (coronary artery disease) 08/14/2018   Dyslipidemia 08/14/2018   Abnormal LFTs 08/05/2018   Choledocholithiasis 08/05/2018   Dilated bile duct 08/05/2018   History of ERCP 08/05/2018   History of renal cell carcinoma 08/05/2018   Port-A-Cath in place 01/30/2018   Counseling regarding advance care planning and goals of care 01/04/2018   Renal cell  carcinoma, left ([place]) 04/11/2017   Cancer, 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[place]) 01/10/2017   Left renal mass 08/01/2016   Hyperlipidemia 05/23/2015   Essential hypertension 05/23/2015   Hypothyroidism 05/23/2015     is allergic to doxycycline, adhesive [tape], duloxetine, gabapentin (once-daily), nsaids, and statins.  MEDICAL HISTORY: Past Medical History:  Diagnosis Date   Anemia    Arthritis    lower back, hips, hands   Biliary stricture    Diabetes mellitus ([place])    type 2    Early cataracts, bilateral    Md just watching   Elevated liver enzymes    Family history of adverse reaction to anesthesia    Daughter hard to wake up   Gallstones    GERD (gastroesophageal reflux disease)    occasional - diet controlled   History of blood transfusion 2018   History of hiatal hernia    HTN (hypertension)    Hyperlipidemia    Hypothyroidism    left renal ca dx'd 2018   renal cancer - left kidney removed, pill chemo x 1 yr   Myocardial infarction ([place]) 1991   no deficits   SVD (spontaneous vaginal delivery)    x 3   Wears glasses     SURGICAL HISTORY: Past Surgical History:  Procedure Laterality Date   BALLOON DILATION N/A 07/23/2018   Procedure: BALLOON DILATION;  Surgeon: Mansouraty, Telford Nab., MD;  Location: [facility];  Service: Gastroenterology;  Laterality: N/A;   BILIARY BRUSHING  08/10/2018   Procedure: BILIARY BRUSHING;  Surgeon: Rush Landmark Telford Nab., MD;  Location: [facility];  Service: Gastroenterology;;   BILIARY BRUSHING  11/30/2018   Procedure: BILIARY BRUSHING;  Surgeon: Irving Copas., MD;  Location: [facility];  Service: Gastroenterology;;   BILIARY DILATION  08/10/2018   Procedure: BILIARY DILATION;  Surgeon: Irving Copas., MD;  Location: [facility];  Service: Gastroenterology;;   BILIARY DILATION  08/27/2018   Procedure: BILIARY DILATION;  Surgeon: Irving Copas., MD;  Location: [facility];  Service: Gastroenterology;;   BILIARY DILATION  01/24/2020   Procedure: BILIARY DILATION;  Surgeon: Irving Copas., MD;  Location: [facility];  Service: Gastroenterology;;   BILIARY STENT PLACEMENT  08/10/2018   Procedure: BILIARY  STENT PLACEMENT;  Surgeon: Irving Copas., MD;  Location: [facility];  Service: Gastroenterology;;   BILIARY STENT PLACEMENT  08/27/2018   Procedure: BILIARY STENT PLACEMENT;  Surgeon: Irving Copas., MD;  Location: [facility];  Service: Gastroenterology;;   BILIARY STENT PLACEMENT  11/30/2018   Procedure: BILIARY STENT PLACEMENT;  Surgeon: Irving Copas., MD;  Location: [facility];  Service: Gastroenterology;;   BIOPSY  07/23/2018   Procedure: BIOPSY;  Surgeon: Irving Copas., MD;  Location: [facility];  Service: Gastroenterology;;   BIOPSY  01/24/2020   Procedure: BIOPSY;  Surgeon: Irving Copas., MD;  Location: [facility];  Service: Gastroenterology;;   CHOLECYSTECTOMY N/A 09/02/2018   Procedure: LAPAROSCOPIC CHOLECYSTECTOMY;  Surgeon: Stark Klein, MD;  Location: [facility];  Service: General;  Laterality: N/A;   COLONOSCOPY     normal    ENDOSCOPIC RETROGRADE CHOLANGIOPANCREATOGRAPHY (ERCP) WITH PROPOFOL N/A 08/10/2018   Procedure: ENDOSCOPIC RETROGRADE CHOLANGIOPANCREATOGRAPHY (ERCP) WITH PROPOFOL;  Surgeon: Irving Copas., MD;  Location: [facility];  Service: Gastroenterology;  Laterality: N/A;   ENDOSCOPIC RETROGRADE CHOLANGIOPANCREATOGRAPHY (ERCP) WITH PROPOFOL N/A 11/30/2018   Procedure: ENDOSCOPIC RETROGRADE CHOLANGIOPANCREATOGRAPHY (ERCP) WITH PROPOFOL;  Surgeon: Rush Landmark Telford Nab., MD;  Location: [facility];  Service: Gastroenterology;  Laterality: N/A;   ENDOSCOPIC RETROGRADE  CHOLANGIOPANCREATOGRAPHY (ERCP) WITH PROPOFOL N/A 01/24/2020   Procedure: ENDOSCOPIC RETROGRADE CHOLANGIOPANCREATOGRAPHY (ERCP) WITH PROPOFOL;  Surgeon: Rush Landmark Telford Nab., MD;  Location: [facility];  Service: Gastroenterology;  Laterality: N/A;   ERCP N/A 07/23/2018   Procedure: ENDOSCOPIC RETROGRADE CHOLANGIOPANCREATOGRAPHY (ERCP);  Surgeon: Irving Copas., MD;  Location: [facility];  Service: Gastroenterology;  Laterality: N/A;    ERCP N/A 08/27/2018   Procedure: ENDOSCOPIC RETROGRADE CHOLANGIOPANCREATOGRAPHY (ERCP);  Surgeon: Irving Copas., MD;  Location: [facility];  Service: Gastroenterology;  Laterality: N/A;   ESOPHAGOGASTRODUODENOSCOPY N/A 01/24/2020   Procedure: ESOPHAGOGASTRODUODENOSCOPY (EGD);  Surgeon: Irving Copas., MD;  Location: [facility];  Service: Gastroenterology;  Laterality: N/A;   ESOPHAGOGASTRODUODENOSCOPY (EGD) WITH PROPOFOL N/A 08/27/2018   Procedure: ESOPHAGOGASTRODUODENOSCOPY (EGD) WITH PROPOFOL;  Surgeon: Rush Landmark Telford Nab., MD;  Location: [facility];  Service: Gastroenterology;  Laterality: N/A;   EUS N/A 08/27/2018   Procedure: ESOPHAGEAL ENDOSCOPIC ULTRASOUND (EUS) RADIAL;  Surgeon: Rush Landmark Telford Nab., MD;  Location: [facility];  Service: Gastroenterology;  Laterality: N/A;   FINE NEEDLE ASPIRATION  08/27/2018   Procedure: FINE NEEDLE ASPIRATION (FNA) LINEAR;  Surgeon: Irving Copas., MD;  Location: [facility];  Service: Gastroenterology;;   Fatima Blank HERNIA REPAIR N/A 06/23/2020   Procedure: [facility];  Surgeon: Felicie Morn, MD;  Location: [facility];  Service: General;  Laterality: N/A;  ROOM 2 STARTING AT 11:00AM FOR 90 MIN   IR IMAGING GUIDED PORT INSERTION  01/08/2018   LAPAROSCOPIC NEPHRECTOMY Left 08/01/2016   Procedure: LAPAROSCOPIC  RADICAL NEPHRECTOMY/ REPAIR OF UMBILICAL HERNIA;  Surgeon: Raynelle Bring, MD;  Location[facility];  Service: Urology;  Laterality: Left;   REMOVAL OF STONES  07/23/2018   Procedure: REMOVAL OF GALL STONES;  Surgeon: Rush Landmark Telford Nab., MD;  Location: [facility];  Service: Gastroenterology;;   REMOVAL OF STONES  08/10/2018   Procedure: REMOVAL OF STONES;  Surgeon: Irving Copas., MD;  Location: [facility];  Service: Gastroenterology;;   REMOVAL OF STONES  08/27/2018   Procedure: REMOVAL OF STONES;  Surgeon: Irving Copas., MD;  Location: [facility];  Service:  Gastroenterology;;   REMOVAL OF STONES  01/24/2020   Procedure: REMOVAL OF STONES;  Surgeon: Irving Copas., MD;  Location: [facility];  Service: Gastroenterology;;   [name]  07/23/2018   Procedure: [name];  Surgeon: Irving Copas., MD;  Location: [facility];  Service: Gastroenterology;;   Lavell Islam REMOVAL  08/27/2018   Procedure: STENT REMOVAL;  Surgeon: Irving Copas., MD;  Location: [facility];  Service: Gastroenterology;;   Lavell Islam REMOVAL  11/30/2018   Procedure: STENT REMOVAL;  Surgeon: Irving Copas., MD;  Location: [facility];  Service: Gastroenterology;;   STENT REMOVAL  01/24/2020   Procedure: STENT REMOVAL;  Surgeon: Irving Copas., MD;  Location: [facility];  Service: Gastroenterology;;   UPPER GI ENDOSCOPY     x 1   VAGINAL PROLAPSE REPAIR  11/18/2019   [facility]    SOCIAL HISTORY: Social History   Socioeconomic History   Marital status: Married    Spouse name: Not on file   Number of children: 3   Years of education: Not on file   Highest education level: Not on file  Occupational History   Occupation: retired  Tobacco Use   Smoking status: Former    Packs/day: 1.00    Years: 8.00    Total pack years: 8.00    Types: Cigarettes    Quit date: 06/18/1964    Years since  quitting: 57.6   Smokeless tobacco: Never  Vaping Use   Vaping Use: Never used  Substance and Sexual Activity   Alcohol use: No   Drug use: No   Sexual activity: Not Currently    Birth control/protection: Post-menopausal  Other Topics Concern   Not on file  Social History Narrative   Married.  Three children   Social Determinants of Health   Financial Resource Strain: Not on file  Food Insecurity: Not on file  Transportation Needs: Not on file  Physical Activity: Not on file  Stress: Not on file  Social Connections: Not on file  Intimate Partner Violence: Not on file    FAMILY HISTORY: Family History  Problem Relation Age  of Onset   Heart attack Father 31   Heart disease Brother 48       CABG   Colon cancer Neg Hx    Esophageal cancer Neg Hx    Inflammatory bowel disease Neg Hx    Liver disease Neg Hx    Pancreatic cancer Neg Hx    Rectal cancer Neg Hx    Stomach cancer Neg Hx      ROS 10 Point review of Systems was done is negative except as noted above.  PHYSICAL EXAMINATION Vital signs reviewed Vitals:   01/16/22 0917  BP: (!) 130/53  Pulse: (!) 56  Resp: 16  Temp: 98 F (36.7 C)  SpO2: 100%   NAD GENERAL:alert, in no acute distress and comfortable SKIN: no acute rashes, no significant lesions EYES: conjunctiva are pink and non-injected, sclera anicteric NECK: supple, no JVD LYMPH:  no palpable lymphadenopathy in the cervical, axillary or inguinal regions LUNGS: clear to auscultation b/l with normal respiratory effort HEART: regular rate & rhythm ABDOMEN:  normoactive bowel sounds , non tender, not distended. Extremity: no pedal edema PSYCH: alert & oriented x 3 with fluent speech NEURO: no focal motor/sensory deficits  Exam performed in chair.  LABORATORY DATA: .    Latest Ref Rng & Units 01/16/2022    8:58 AM 01/02/2022    9:21 AM 12/19/2021    8:37 AM  CBC  WBC 4.0 - 10.5 K/uL 10.1  9.1  8.5   Hemoglobin 12.0 - 15.0 g/dL 13.7  13.2  12.6   Hematocrit 36.0 - 46.0 % 41.4  40.6  38.9   Platelets 150 - 400 K/uL 288  243  286    .    Latest Ref Rng & Units 01/16/2022    8:58 AM 01/02/2022    9:21 AM 12/19/2021    8:37 AM  CMP  Glucose 70 - 99 mg/dL 163  168  117   BUN 8 - 23 mg/dL 36  32  40   Creatinine 0.44 - 1.00 mg/dL 1.76  1.74  1.77   Sodium 135 - 145 mmol/L 140  139  140   Potassium 3.5 - 5.1 mmol/L 4.1  4.1  4.4   Chloride 98 - 111 mmol/L 114  114  116   CO2 22 - 32 mmol/L '19  21  19   '$ Calcium 8.9 - 10.3 mg/dL 8.8  9.1  8.6   Total Protein 6.5 - 8.1 g/dL 6.5  6.1  6.0   Total Bilirubin 0.3 - 1.2 mg/dL 0.5  0.4  0.4   Alkaline Phos 38 - 126 U/L 59  69  51    AST 15 - 41 U/L '22  26  17   '$ ALT 0 - 44 U/L 24  46  15    RADIOLOGY    CLINICAL DATA:  Subsequent treatment strategy for metastatic renal cell carcinoma.   EXAM: NUCLEAR MEDICINE PET SKULL BASE TO THIGH   TECHNIQUE: 6.32 mCi F-18 FDG was injected intravenously. Full-ring PET imaging was performed from the skull base to thigh after the radiotracer. CT data was obtained and used for attenuation correction and anatomic localization.   Fasting blood glucose: 159 mg/dl   COMPARISON:  PET-CT 08/29/2020 and 02/09/2020.   FINDINGS: Mediastinal blood pool activity: SUV max 2.7   NECK:   No hypermetabolic cervical lymph nodes are identified.There are no lesions of the pharyngeal mucosal space. Activity associated with the muscles of phonation is within physiologic limits and similar to previous study.   Incidental CT findings: Bilateral carotid atherosclerosis.   CHEST:   There are no hypermetabolic mediastinal, hilar or axillary lymph nodes. No hypermetabolic pulmonary activity or suspicious nodularity.   Incidental CT findings: Right IJ Port-A-Cath extends to the superior cavoatrial junction. There is diffuse atherosclerosis of the aorta, great vessels and coronary arteries. Mild linear scarring at both lung bases appears unchanged.   ABDOMEN/PELVIS:   There is no hypermetabolic activity within the liver, adrenal glands, spleen or pancreas. There is no hypermetabolic nodal activity. Stable appearance of the right kidney. No hypermetabolic activity within the left nephrectomy bed   Incidental CT findings: Diffuse aortic and branch vessel atherosclerosis, scattered colonic diverticulosis and pelvic floor laxity are again noted.   SKELETON:   There is no hypermetabolic activity to suggest osseous metastatic disease.   Incidental CT findings: Grossly stable sclerotic lesions within the thoracic spine, without hypermetabolic activity. Lower lumbar spondylosis and  sacroiliac degenerative changes bilaterally.   IMPRESSION: 1. Stable PET-CT status post left nephrectomy. No evidence of local recurrence or hypermetabolic metastatic disease. 2. Stable incidental findings including coronary and Aortic Atherosclerosis (ICD10-I70.0). Pelvic floor laxity.     Electronically Signed   By: Richardean Sale M.D.   On: 04/03/2021 12:30      ASSESSMENT and PLAN:   81 y.o. 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 PET/CT 06/19/2017 -- consistent with partial metabolic response to treatment.   Rt adrenal gland bx - showed clear cell RCC   S/p CYtoreductive left radical nephrectomy and left adrenal gland resection on 08/01/2016 by Dr Alinda Money.   10/16/17 PET/CT revealed Continued improvement, with the T7 metastatic lesion no longer significantly hypermetabolic. The previous right lower lobe pulmonary nodule is even less apparent, perhaps about 2 mm in diameter today; given that this measured 6 mm on 03/28/2017 this probably represents an effectively treated metastatic lesion. Currently no appreciable hypermetabolic activity is identified to suggest active malignancy. Distended gallbladder with gallbladder wall thickening and gallstones. Correlate clinically in assessing for cholecystitis. Small but abnormal amount of free pelvic fluid, nonspecific. Other imaging findings of potential clinical significance: Chronic ethmoid sinusitis. Aortic Atherosclerosis. Stable 5 mm right middle lobe pulmonary nodule, not hypermetabolic but below sensitive PET-CT size thresholds. Left nephrectomy. Notable pelvic floor laxity with cystocele. Chronic bilateral Sacroiliitis.   04/03/18 PET/CT revealed No evidence for new or progressive hypermetabolic disease on today's study to suggest metastatic progression. 2. Stable appearance of the T7 metastatic  lesion without Hypermetabolism. 3. Tiny focus of FDG accumulation identified along the skin of the low right inguinal fold. No associated lesion evident on CT. This may be related to  urinary contaminant. 4. Cholelithiasis with similar appearance of diffuse gallbladder wall thickening. 5. Stable 5 mm right middle lobe pulmonary nodule. 6.  Aortic Atherosclerois. 7. Diffuse colonic diverticulosis.    09/18/18 CT A/P revealed "Common duct stent in place. Improvement to resolution in biliary duct dilatation compared to 08/29/2018. Interval cholecystectomy without acute complication. 2. Left nephrectomy, without evidence of metastatic disease. 3.  Tiny hiatal hernia. 4. Coronary artery atherosclerosis. Aortic Atherosclerosis. 5. Mild limitations secondary to lack of IV contrast. 6. Marked pelvic floor laxity with cystocele and rectal prolapse".   07/02/2019 PET/CT ([id number]) which revealed "1. 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   #3 Small pulmonary lesions -- 10/16/17 PET/CT showed pulmonary less apparent than 03/28/17 PET/CT, decreasing from 84m to 232mdiameter.   MRI brain shows no evidence of metastatic disease   #4 T7/8 Bone metastases - received Xgeva every 4 weeks from May 2018 to June 2019. 10/16/17 PET/CT revealed improvement with T7 metastatic lesion no longer significantly hypermetabolic.  -on XgMarchelle Folks #5 ?liver mets- rpt PET/CT from 10/16/2017 shows no overt evidence of metastatic disease in the liver.   #6 Cchronic renal insufficiency Creatine stable   #7 Hemorrhoids -chronic with some bleeding --Recommended Sitz bath and OTC Anusol or Nupercaine for her hemorrhoid relief. -f/u with PCP for continued mx   #8 Hypothyroidism/Adrenal insufficiency/Diabetes -Continue being followed by Dr. KeBuddy Duty #9 h/o HTN - ?control. Patient tends to be anxious and has higher blood  pressures in the clinic.   #10 CAD - positive cardiac nuclear stress test Following with cardiology and nephrology    #11 h/o Choledocholithiasis and cholelithiasis 07/10/18 USKoreabdomen revealed Biliary duct dilatation with 8 mm calculus at the level of the ampulla in the distal common bile duct. 2. Cholelithiasis. No gallbladder wall thickening or pericholecystic fluid. 3. Appearance of the liver raises concern for underlying hepatic cirrhosis. No focal liver lesions are demonstrable. 4.  Left kidney absent. 5.  Small cysts in right kidney. 6.  Aortic Atherosclerosis.   S/p ERCP on 07/23/18 with Dr. GaValarie Merinoansouraty   09/02/18 Cytopathology from ERCP was suspicious for malignant cells in bile duct, but was not definitive, other two findings were benign.   # 12 Peripheral arterial disease -- following with cardiology/vascular surgery  PLAN:   -Labs done today were reviewed in detail with the patient CBC stable with a normal hemoglobin now of 13.7 normal platelets of 288k CMP shows Stable liver and kidney function.  Abnormal liver enzymes normalized after patient stopped Cymbalta. PET CT scan done on 11/09/2021 showed no overt radiographic evidence of progressive/recurrent metastatic disease from her renal cell carcinoma. Patient has no new symptoms suggestive of progression of her metastatic renal cell carcinoma at this time. No notable toxicities from her current immunotherapy We will continue nivolumab as per scheduled plan. I discussed switching to monthly nivolumab but the patient prefers to continue it as per current dosing every 2 weeks. She is continue to follow with pain management and primary care physician.  FOLLOW UP: Continue nivolumab every 2 weeks please schedule next 4 cycles of treatment with labs MD visit in 6 weeks  The total time spent in the appointment was 25 minutes*.  All of the patient's questions were answered with apparent satisfaction. The patient knows to call  the clinic with any problems, questions or concerns.   GaSullivan Lone  MD MS AAHIVMS [facility] Hematology/Oncology Physician [facility]  .*Total Encounter Time as defined by the Centers for Medicare and Medicaid Services includes, in addition to the face-to-face time of a patient visit (documented in the note above) non-face-to-face time: obtaining and reviewing outside history, ordering and reviewing medications, tests or procedures, care coordination (communications with other health care professionals or caregivers) and documentation in the medical record.  I, Melene Muller, am acting as scribe for Dr. Sullivan Lone, MD.  .I have reviewed the above documentation for accuracy and completeness, and I agree with the above. Brunetta Genera MD

## 2022-01-22 ENCOUNTER — Encounter: Payer: Self-pay | Admitting: Hematology

## 2022-01-28 ENCOUNTER — Other Ambulatory Visit: Payer: Self-pay | Admitting: Hematology

## 2022-01-28 DIAGNOSIS — C7951 Secondary malignant neoplasm of bone: Secondary | ICD-10-CM

## 2022-01-28 DIAGNOSIS — C642 Malignant neoplasm of left kidney, except renal pelvis: Secondary | ICD-10-CM

## 2022-01-28 NOTE — Progress Notes (Signed)
Epic beacon go live updated orders

## 2022-01-29 ENCOUNTER — Other Ambulatory Visit: Payer: Self-pay

## 2022-01-31 ENCOUNTER — Other Ambulatory Visit: Payer: Self-pay

## 2022-02-01 ENCOUNTER — Inpatient Hospital Stay: Payer: Medicare Other | Attending: Hematology

## 2022-02-01 ENCOUNTER — Inpatient Hospital Stay: Payer: Medicare Other | Admitting: Hematology

## 2022-02-01 ENCOUNTER — Inpatient Hospital Stay: Payer: Medicare Other

## 2022-02-01 ENCOUNTER — Other Ambulatory Visit: Payer: Self-pay | Admitting: Hematology

## 2022-02-01 DIAGNOSIS — C642 Malignant neoplasm of left kidney, except renal pelvis: Secondary | ICD-10-CM | POA: Diagnosis not present

## 2022-02-01 DIAGNOSIS — Z95828 Presence of other vascular implants and grafts: Secondary | ICD-10-CM

## 2022-02-01 DIAGNOSIS — E039 Hypothyroidism, unspecified: Secondary | ICD-10-CM | POA: Diagnosis not present

## 2022-02-01 DIAGNOSIS — Z5112 Encounter for antineoplastic immunotherapy: Secondary | ICD-10-CM | POA: Insufficient documentation

## 2022-02-01 DIAGNOSIS — C7951 Secondary malignant neoplasm of bone: Secondary | ICD-10-CM

## 2022-02-01 LAB — CBC WITH DIFFERENTIAL (CANCER CENTER ONLY)
Abs Immature Granulocytes: 0.18 10*3/uL — ABNORMAL HIGH (ref 0.00–0.07)
Basophils Absolute: 0.1 10*3/uL (ref 0.0–0.1)
Basophils Relative: 1 %
Eosinophils Absolute: 0.6 10*3/uL — ABNORMAL HIGH (ref 0.0–0.5)
Eosinophils Relative: 7 %
HCT: 41.3 % (ref 36.0–46.0)
Hemoglobin: 13.4 g/dL (ref 12.0–15.0)
Immature Granulocytes: 2 %
Lymphocytes Relative: 13 %
Lymphs Abs: 1.3 10*3/uL (ref 0.7–4.0)
MCH: 29.6 pg (ref 26.0–34.0)
MCHC: 32.4 g/dL (ref 30.0–36.0)
MCV: 91.2 fL (ref 80.0–100.0)
Monocytes Absolute: 1 10*3/uL (ref 0.1–1.0)
Monocytes Relative: 10 %
Neutro Abs: 6.7 10*3/uL (ref 1.7–7.7)
Neutrophils Relative %: 67 %
Platelet Count: 253 10*3/uL (ref 150–400)
RBC: 4.53 MIL/uL (ref 3.87–5.11)
RDW: 15.1 % (ref 11.5–15.5)
WBC Count: 9.9 10*3/uL (ref 4.0–10.5)
nRBC: 0 % (ref 0.0–0.2)

## 2022-02-01 LAB — CMP (CANCER CENTER ONLY)
ALT: 17 U/L (ref 0–44)
AST: 17 U/L (ref 15–41)
Albumin: 3.6 g/dL (ref 3.5–5.0)
Alkaline Phosphatase: 55 U/L (ref 38–126)
Anion gap: 7 (ref 5–15)
BUN: 42 mg/dL — ABNORMAL HIGH (ref 8–23)
CO2: 21 mmol/L — ABNORMAL LOW (ref 22–32)
Calcium: 9.1 mg/dL (ref 8.9–10.3)
Chloride: 112 mmol/L — ABNORMAL HIGH (ref 98–111)
Creatinine: 1.71 mg/dL — ABNORMAL HIGH (ref 0.44–1.00)
GFR, Estimated: 30 mL/min — ABNORMAL LOW (ref >60)
Glucose, Bld: 172 mg/dL — ABNORMAL HIGH (ref 70–99)
Potassium: 4.1 mmol/L (ref 3.5–5.1)
Sodium: 140 mmol/L (ref 135–145)
Total Bilirubin: 0.5 mg/dL (ref 0.3–1.2)
Total Protein: 6 g/dL — ABNORMAL LOW (ref 6.5–8.1)

## 2022-02-01 LAB — TSH: TSH: 0.863 u[IU]/mL (ref 0.350–4.500)

## 2022-02-01 MED ORDER — SODIUM CHLORIDE 0.9% FLUSH
10.0000 mL | Freq: Once | INTRAVENOUS | Status: AC
  Administered 2022-02-01: 10 mL

## 2022-02-01 MED ORDER — SODIUM CHLORIDE 0.9 % IV SOLN
240.0000 mg | Freq: Once | INTRAVENOUS | Status: AC
  Administered 2022-02-01: 240 mg via INTRAVENOUS
  Filled 2022-02-01: qty 24

## 2022-02-01 MED ORDER — SODIUM CHLORIDE 0.9% FLUSH
10.0000 mL | INTRAVENOUS | Status: DC | PRN
  Administered 2022-02-01: 10 mL

## 2022-02-01 MED ORDER — SODIUM CHLORIDE 0.9 % IV SOLN
300.0000 mg | Freq: Once | INTRAVENOUS | Status: DC
  Filled 2022-02-01: qty 15

## 2022-02-01 MED ORDER — HEPARIN SOD (PORK) LOCK FLUSH 100 UNIT/ML IV SOLN
500.0000 [IU] | Freq: Once | INTRAVENOUS | Status: AC | PRN
  Administered 2022-02-01: 500 [IU]

## 2022-02-01 MED ORDER — SODIUM CHLORIDE 0.9 % IV SOLN: Freq: Once | INTRAVENOUS | Status: AC

## 2022-02-01 NOTE — Progress Notes (Signed)
MD aware Scr is 1.71 / Pt is ok for tx.

## 2022-02-01 NOTE — Patient Instructions (Signed)
[facility]   Discharge Instructions: Thank you for choosing [facility] to provide your oncology and hematology care.   If you have a lab appointment with the Cancer Center, please go directly to the Cancer Center and check in at the registration area.   Wear comfortable clothing and clothing appropriate for easy access to any Portacath or PICC line.   We strive to give you quality time with your provider. You may need to reschedule your appointment if you arrive late (15 or more minutes).  Arriving late affects you and other patients whose appointments are after yours.  Also, if you miss three or more appointments without notifying the office, you may be dismissed from the clinic at the provider's discretion.      For prescription refill requests, have your pharmacy contact our office and allow 72 hours for refills to be completed.    Today you received the following chemotherapy and/or immunotherapy agents: nivolumab      To help prevent nausea and vomiting after your treatment, we encourage you to take your nausea medication as directed.  BELOW ARE SYMPTOMS THAT SHOULD BE REPORTED IMMEDIATELY: *FEVER GREATER THAN 100.4 F (38 C) OR HIGHER *CHILLS OR SWEATING *NAUSEA AND VOMITING THAT IS NOT CONTROLLED WITH YOUR NAUSEA MEDICATION *UNUSUAL SHORTNESS OF BREATH *UNUSUAL BRUISING OR BLEEDING *URINARY PROBLEMS (pain or burning when urinating, or frequent urination) *BOWEL PROBLEMS (unusual diarrhea, constipation, pain near the anus) TENDERNESS IN MOUTH AND THROAT WITH OR WITHOUT PRESENCE OF ULCERS (sore throat, sores in mouth, or a toothache) UNUSUAL RASH, SWELLING OR PAIN  UNUSUAL VAGINAL DISCHARGE OR ITCHING   Items with * indicate a potential emergency and should be followed up as soon as possible or go to the Emergency Department if any problems should occur.  Please show the CHEMOTHERAPY ALERT CARD or IMMUNOTHERAPY ALERT CARD at check-in  to the Emergency Department and triage nurse.  Should you have questions after your visit or need to cancel or reschedule your appointment, please contact [facility]  Dept: [phone]  and follow the prompts.  Office hours are 8:00 a.m. to 4:30 p.m. Monday - Friday. Please note that voicemails left after 4:00 p.m. may not be returned until the following business day.  We are closed weekends and major holidays. You have access to a nurse at all times for urgent questions. Please call the main number to the clinic Dept: [phone] and follow the prompts.   For any non-urgent questions, you may also contact your provider using MyChart. We now offer e-Visits for anyone 18 and older to request care online for non-urgent symptoms. For details visit [email or web address].   Also download the MyChart app! Go to the app store, search "MyChart", open the app, select [facility], and log in with your MyChart username and password.  Masks are optional in the cancer centers. If you would like for your care team to wear a mask while they are taking care of you, please let them know. You may have one support person who is at least 80 years old accompany you for your appointments. 

## 2022-02-02 LAB — T4: T4, Total: 7.6 ug/dL (ref 4.5–12.0)

## 2022-02-06 ENCOUNTER — Other Ambulatory Visit: Payer: Self-pay

## 2022-02-13 ENCOUNTER — Inpatient Hospital Stay: Payer: Medicare Other

## 2022-02-13 VITALS — BP 128/78 | HR 68 | Temp 98.2°F | Resp 18 | Wt 125.0 lb

## 2022-02-13 DIAGNOSIS — Z5112 Encounter for antineoplastic immunotherapy: Secondary | ICD-10-CM | POA: Diagnosis not present

## 2022-02-13 DIAGNOSIS — C7951 Secondary malignant neoplasm of bone: Secondary | ICD-10-CM

## 2022-02-13 DIAGNOSIS — C642 Malignant neoplasm of left kidney, except renal pelvis: Secondary | ICD-10-CM

## 2022-02-13 DIAGNOSIS — Z95828 Presence of other vascular implants and grafts: Secondary | ICD-10-CM

## 2022-02-13 LAB — CBC WITH DIFFERENTIAL (CANCER CENTER ONLY)
Abs Immature Granulocytes: 0.15 10*3/uL — ABNORMAL HIGH (ref 0.00–0.07)
Basophils Absolute: 0.1 10*3/uL (ref 0.0–0.1)
Basophils Relative: 1 %
Eosinophils Absolute: 0.6 10*3/uL — ABNORMAL HIGH (ref 0.0–0.5)
Eosinophils Relative: 6 %
HCT: 41.1 % (ref 36.0–46.0)
Hemoglobin: 13.4 g/dL (ref 12.0–15.0)
Immature Granulocytes: 2 %
Lymphocytes Relative: 13 %
Lymphs Abs: 1.2 10*3/uL (ref 0.7–4.0)
MCH: 30.1 pg (ref 26.0–34.0)
MCHC: 32.6 g/dL (ref 30.0–36.0)
MCV: 92.4 fL (ref 80.0–100.0)
Monocytes Absolute: 1 10*3/uL (ref 0.1–1.0)
Monocytes Relative: 11 %
Neutro Abs: 6.5 10*3/uL (ref 1.7–7.7)
Neutrophils Relative %: 67 %
Platelet Count: 233 10*3/uL (ref 150–400)
RBC: 4.45 MIL/uL (ref 3.87–5.11)
RDW: 14.8 % (ref 11.5–15.5)
WBC Count: 9.5 10*3/uL (ref 4.0–10.5)
nRBC: 0 % (ref 0.0–0.2)

## 2022-02-13 LAB — CMP (CANCER CENTER ONLY)
ALT: 39 U/L (ref 0–44)
AST: 28 U/L (ref 15–41)
Albumin: 3.7 g/dL (ref 3.5–5.0)
Alkaline Phosphatase: 55 U/L (ref 38–126)
Anion gap: 6 (ref 5–15)
BUN: 30 mg/dL — ABNORMAL HIGH (ref 8–23)
CO2: 23 mmol/L (ref 22–32)
Calcium: 9.1 mg/dL (ref 8.9–10.3)
Chloride: 111 mmol/L (ref 98–111)
Creatinine: 1.61 mg/dL — ABNORMAL HIGH (ref 0.44–1.00)
GFR, Estimated: 32 mL/min — ABNORMAL LOW (ref >60)
Glucose, Bld: 135 mg/dL — ABNORMAL HIGH (ref 70–99)
Potassium: 4.5 mmol/L (ref 3.5–5.1)
Sodium: 140 mmol/L (ref 135–145)
Total Bilirubin: 0.5 mg/dL (ref 0.3–1.2)
Total Protein: 6.2 g/dL — ABNORMAL LOW (ref 6.5–8.1)

## 2022-02-13 MED ORDER — SODIUM CHLORIDE 0.9 % IV SOLN: Freq: Once | INTRAVENOUS | Status: DC

## 2022-02-13 MED ORDER — SODIUM CHLORIDE 0.9% FLUSH
10.0000 mL | Freq: Once | INTRAVENOUS | Status: AC | PRN
  Administered 2022-02-13: 10 mL

## 2022-02-13 MED ORDER — HEPARIN SOD (PORK) LOCK FLUSH 100 UNIT/ML IV SOLN
500.0000 [IU] | Freq: Once | INTRAVENOUS | Status: AC | PRN
  Administered 2022-02-13: 500 [IU]

## 2022-02-13 MED ORDER — SODIUM CHLORIDE 0.9 % IV SOLN
240.0000 mg | Freq: Once | INTRAVENOUS | Status: AC
  Administered 2022-02-13: 240 mg via INTRAVENOUS
  Filled 2022-02-13: qty 24

## 2022-02-13 MED ORDER — SODIUM CHLORIDE 0.9% FLUSH
10.0000 mL | INTRAVENOUS | Status: DC | PRN
  Administered 2022-02-13: 10 mL

## 2022-02-13 NOTE — Progress Notes (Signed)
Patient refused iron infusion today, provider aware.

## 2022-02-26 ENCOUNTER — Other Ambulatory Visit: Payer: Self-pay

## 2022-02-26 DIAGNOSIS — C642 Malignant neoplasm of left kidney, except renal pelvis: Secondary | ICD-10-CM

## 2022-02-26 DIAGNOSIS — I2510 Atherosclerotic heart disease of native coronary artery without angina pectoris: Secondary | ICD-10-CM

## 2022-02-26 DIAGNOSIS — C7951 Secondary malignant neoplasm of bone: Secondary | ICD-10-CM

## 2022-02-27 ENCOUNTER — Inpatient Hospital Stay: Payer: Medicare Other | Attending: Hematology

## 2022-02-27 ENCOUNTER — Inpatient Hospital Stay: Payer: Medicare Other

## 2022-02-27 VITALS — BP 128/62 | HR 57 | Temp 98.2°F | Resp 17 | Wt 123.8 lb

## 2022-02-27 DIAGNOSIS — G629 Polyneuropathy, unspecified: Secondary | ICD-10-CM | POA: Diagnosis not present

## 2022-02-27 DIAGNOSIS — I700 Atherosclerosis of aorta: Secondary | ICD-10-CM | POA: Insufficient documentation

## 2022-02-27 DIAGNOSIS — C642 Malignant neoplasm of left kidney, except renal pelvis: Secondary | ICD-10-CM | POA: Insufficient documentation

## 2022-02-27 DIAGNOSIS — K649 Unspecified hemorrhoids: Secondary | ICD-10-CM | POA: Diagnosis not present

## 2022-02-27 DIAGNOSIS — K219 Gastro-esophageal reflux disease without esophagitis: Secondary | ICD-10-CM | POA: Diagnosis not present

## 2022-02-27 DIAGNOSIS — C7951 Secondary malignant neoplasm of bone: Secondary | ICD-10-CM | POA: Diagnosis not present

## 2022-02-27 DIAGNOSIS — I10 Essential (primary) hypertension: Secondary | ICD-10-CM | POA: Diagnosis not present

## 2022-02-27 DIAGNOSIS — Z87891 Personal history of nicotine dependence: Secondary | ICD-10-CM | POA: Diagnosis not present

## 2022-02-27 DIAGNOSIS — Z5112 Encounter for antineoplastic immunotherapy: Secondary | ICD-10-CM | POA: Diagnosis present

## 2022-02-27 DIAGNOSIS — E039 Hypothyroidism, unspecified: Secondary | ICD-10-CM | POA: Diagnosis not present

## 2022-02-27 DIAGNOSIS — R911 Solitary pulmonary nodule: Secondary | ICD-10-CM | POA: Diagnosis not present

## 2022-02-27 DIAGNOSIS — I739 Peripheral vascular disease, unspecified: Secondary | ICD-10-CM | POA: Diagnosis not present

## 2022-02-27 DIAGNOSIS — I2510 Atherosclerotic heart disease of native coronary artery without angina pectoris: Secondary | ICD-10-CM | POA: Insufficient documentation

## 2022-02-27 DIAGNOSIS — E785 Hyperlipidemia, unspecified: Secondary | ICD-10-CM | POA: Diagnosis not present

## 2022-02-27 DIAGNOSIS — N189 Chronic kidney disease, unspecified: Secondary | ICD-10-CM | POA: Diagnosis not present

## 2022-02-27 DIAGNOSIS — I129 Hypertensive chronic kidney disease with stage 1 through stage 4 chronic kidney disease, or unspecified chronic kidney disease: Secondary | ICD-10-CM | POA: Diagnosis not present

## 2022-02-27 DIAGNOSIS — Z95828 Presence of other vascular implants and grafts: Secondary | ICD-10-CM

## 2022-02-27 DIAGNOSIS — E119 Type 2 diabetes mellitus without complications: Secondary | ICD-10-CM | POA: Insufficient documentation

## 2022-02-27 LAB — CMP (CANCER CENTER ONLY)
ALT: 38 U/L (ref 0–44)
AST: 36 U/L (ref 15–41)
Albumin: 3.9 g/dL (ref 3.5–5.0)
Alkaline Phosphatase: 58 U/L (ref 38–126)
Anion gap: 6 (ref 5–15)
BUN: 34 mg/dL — ABNORMAL HIGH (ref 8–23)
CO2: 20 mmol/L — ABNORMAL LOW (ref 22–32)
Calcium: 9.3 mg/dL (ref 8.9–10.3)
Chloride: 113 mmol/L — ABNORMAL HIGH (ref 98–111)
Creatinine: 1.58 mg/dL — ABNORMAL HIGH (ref 0.44–1.00)
GFR, Estimated: 33 mL/min — ABNORMAL LOW (ref >60)
Glucose, Bld: 107 mg/dL — ABNORMAL HIGH (ref 70–99)
Potassium: 4.3 mmol/L (ref 3.5–5.1)
Sodium: 139 mmol/L (ref 135–145)
Total Bilirubin: 0.4 mg/dL (ref 0.3–1.2)
Total Protein: 6.4 g/dL — ABNORMAL LOW (ref 6.5–8.1)

## 2022-02-27 LAB — CBC WITH DIFFERENTIAL (CANCER CENTER ONLY)
Abs Immature Granulocytes: 0.09 10*3/uL — ABNORMAL HIGH (ref 0.00–0.07)
Basophils Absolute: 0.1 10*3/uL (ref 0.0–0.1)
Basophils Relative: 1 %
Eosinophils Absolute: 0.7 10*3/uL — ABNORMAL HIGH (ref 0.0–0.5)
Eosinophils Relative: 8 %
HCT: 43.3 % (ref 36.0–46.0)
Hemoglobin: 14 g/dL (ref 12.0–15.0)
Immature Granulocytes: 1 %
Lymphocytes Relative: 17 %
Lymphs Abs: 1.4 10*3/uL (ref 0.7–4.0)
MCH: 30.3 pg (ref 26.0–34.0)
MCHC: 32.3 g/dL (ref 30.0–36.0)
MCV: 93.7 fL (ref 80.0–100.0)
Monocytes Absolute: 0.9 10*3/uL (ref 0.1–1.0)
Monocytes Relative: 11 %
Neutro Abs: 4.9 10*3/uL (ref 1.7–7.7)
Neutrophils Relative %: 62 %
Platelet Count: 255 10*3/uL (ref 150–400)
RBC: 4.62 MIL/uL (ref 3.87–5.11)
RDW: 14.6 % (ref 11.5–15.5)
WBC Count: 8.1 10*3/uL (ref 4.0–10.5)
nRBC: 0 % (ref 0.0–0.2)

## 2022-02-27 LAB — TSH: TSH: 3.298 u[IU]/mL (ref 0.350–4.500)

## 2022-02-27 MED ORDER — DENOSUMAB 120 MG/1.7ML ~~LOC~~ SOLN
120.0000 mg | Freq: Once | SUBCUTANEOUS | Status: AC
  Administered 2022-02-27: 120 mg via SUBCUTANEOUS
  Filled 2022-02-27: qty 1.7

## 2022-02-27 MED ORDER — HEPARIN SOD (PORK) LOCK FLUSH 100 UNIT/ML IV SOLN
500.0000 [IU] | Freq: Once | INTRAVENOUS | Status: AC | PRN
  Administered 2022-02-27: 500 [IU]

## 2022-02-27 MED ORDER — SODIUM CHLORIDE 0.9% FLUSH
10.0000 mL | INTRAVENOUS | Status: DC | PRN
  Administered 2022-02-27: 10 mL

## 2022-02-27 MED ORDER — SODIUM CHLORIDE 0.9 % IV SOLN
240.0000 mg | Freq: Once | INTRAVENOUS | Status: AC
  Administered 2022-02-27: 240 mg via INTRAVENOUS
  Filled 2022-02-27: qty 24

## 2022-02-27 MED ORDER — SODIUM CHLORIDE 0.9% FLUSH
10.0000 mL | Freq: Once | INTRAVENOUS | Status: AC
  Administered 2022-02-27: 10 mL

## 2022-02-27 MED ORDER — SODIUM CHLORIDE 0.9 % IV SOLN: Freq: Once | INTRAVENOUS | Status: AC

## 2022-02-27 NOTE — Progress Notes (Signed)
Per Dr Irene Limbo, ok to proceed today with creatine 1.58 mg/dL

## 2022-02-28 LAB — LIPID PANEL
Cholesterol: 129 mg/dL (ref 0–200)
HDL: 31 mg/dL — ABNORMAL LOW (ref >40)
LDL Cholesterol: 80 mg/dL (ref 0–99)
Total CHOL/HDL Ratio: 4.2 RATIO
Triglycerides: 92 mg/dL (ref <150)
VLDL: 18 mg/dL (ref 0–40)

## 2022-02-28 LAB — T4: T4, Total: 8 ug/dL (ref 4.5–12.0)

## 2022-03-13 ENCOUNTER — Other Ambulatory Visit: Payer: Self-pay

## 2022-03-13 ENCOUNTER — Inpatient Hospital Stay: Payer: Medicare Other

## 2022-03-13 ENCOUNTER — Inpatient Hospital Stay (HOSPITAL_BASED_OUTPATIENT_CLINIC_OR_DEPARTMENT_OTHER): Payer: Medicare Other | Admitting: Hematology

## 2022-03-13 VITALS — BP 125/55 | HR 63 | Resp 16

## 2022-03-13 VITALS — BP 136/58 | HR 57 | Temp 97.8°F | Resp 15 | Ht 59.0 in | Wt 123.2 lb

## 2022-03-13 DIAGNOSIS — C7951 Secondary malignant neoplasm of bone: Secondary | ICD-10-CM

## 2022-03-13 DIAGNOSIS — Z5112 Encounter for antineoplastic immunotherapy: Secondary | ICD-10-CM | POA: Diagnosis not present

## 2022-03-13 DIAGNOSIS — C642 Malignant neoplasm of left kidney, except renal pelvis: Secondary | ICD-10-CM | POA: Diagnosis not present

## 2022-03-13 DIAGNOSIS — Z95828 Presence of other vascular implants and grafts: Secondary | ICD-10-CM

## 2022-03-13 LAB — CBC WITH DIFFERENTIAL (CANCER CENTER ONLY)
Abs Immature Granulocytes: 0.12 10*3/uL — ABNORMAL HIGH (ref 0.00–0.07)
Basophils Absolute: 0.1 10*3/uL (ref 0.0–0.1)
Basophils Relative: 1 %
Eosinophils Absolute: 0.2 10*3/uL (ref 0.0–0.5)
Eosinophils Relative: 2 %
HCT: 42.8 % (ref 36.0–46.0)
Hemoglobin: 13.9 g/dL (ref 12.0–15.0)
Immature Granulocytes: 1 %
Lymphocytes Relative: 8 %
Lymphs Abs: 0.9 10*3/uL (ref 0.7–4.0)
MCH: 30.5 pg (ref 26.0–34.0)
MCHC: 32.5 g/dL (ref 30.0–36.0)
MCV: 93.9 fL (ref 80.0–100.0)
Monocytes Absolute: 1.2 10*3/uL — ABNORMAL HIGH (ref 0.1–1.0)
Monocytes Relative: 11 %
Neutro Abs: 8.7 10*3/uL — ABNORMAL HIGH (ref 1.7–7.7)
Neutrophils Relative %: 77 %
Platelet Count: 239 10*3/uL (ref 150–400)
RBC: 4.56 MIL/uL (ref 3.87–5.11)
RDW: 14.9 % (ref 11.5–15.5)
WBC Count: 11.1 10*3/uL — ABNORMAL HIGH (ref 4.0–10.5)
nRBC: 0 % (ref 0.0–0.2)

## 2022-03-13 LAB — CMP (CANCER CENTER ONLY)
ALT: 25 U/L (ref 0–44)
AST: 24 U/L (ref 15–41)
Albumin: 3.7 g/dL (ref 3.5–5.0)
Alkaline Phosphatase: 49 U/L (ref 38–126)
Anion gap: 4 — ABNORMAL LOW (ref 5–15)
BUN: 55 mg/dL — ABNORMAL HIGH (ref 8–23)
CO2: 15 mmol/L — ABNORMAL LOW (ref 22–32)
Calcium: 8.7 mg/dL — ABNORMAL LOW (ref 8.9–10.3)
Chloride: 120 mmol/L — ABNORMAL HIGH (ref 98–111)
Creatinine: 1.77 mg/dL — ABNORMAL HIGH (ref 0.44–1.00)
GFR, Estimated: 29 mL/min — ABNORMAL LOW (ref >60)
Glucose, Bld: 138 mg/dL — ABNORMAL HIGH (ref 70–99)
Potassium: 4.6 mmol/L (ref 3.5–5.1)
Sodium: 139 mmol/L (ref 135–145)
Total Bilirubin: 0.8 mg/dL (ref 0.3–1.2)
Total Protein: 6.3 g/dL — ABNORMAL LOW (ref 6.5–8.1)

## 2022-03-13 MED ORDER — SODIUM CHLORIDE 0.9% FLUSH
10.0000 mL | Freq: Once | INTRAVENOUS | Status: AC
  Administered 2022-03-13: 10 mL

## 2022-03-13 MED ORDER — SODIUM CHLORIDE 0.9% FLUSH
10.0000 mL | INTRAVENOUS | Status: DC | PRN
  Administered 2022-03-13: 10 mL

## 2022-03-13 MED ORDER — SODIUM CHLORIDE 0.9 % IV SOLN
240.0000 mg | Freq: Once | INTRAVENOUS | Status: AC
  Administered 2022-03-13: 240 mg via INTRAVENOUS
  Filled 2022-03-13: qty 24

## 2022-03-13 MED ORDER — HEPARIN SOD (PORK) LOCK FLUSH 100 UNIT/ML IV SOLN
500.0000 [IU] | Freq: Once | INTRAVENOUS | Status: AC | PRN
  Administered 2022-03-13: 500 [IU]

## 2022-03-13 MED ORDER — SODIUM CHLORIDE 0.9 % IV SOLN: Freq: Once | INTRAVENOUS | Status: AC

## 2022-03-13 NOTE — Patient Instructions (Signed)
[facility]COL[facility]   Discharge Instructions: Thank you for choosing [facility] to provide your oncology and hematology care.   If you have a lab appointment with the Cancer Center, please go directly to the Cancer Center and check in at the registration area.   Wear comfortable clothing and clothing appropriate for easy access to any Portacath or PICC line.   We strive to give you quality time with your provider. You may need to reschedule your appointment if you arrive late (15 or more minutes).  Arriving late affects you and other patients whose appointments are after yours.  Also, if you miss three or more appointments without notifying the office, you may be dismissed from the clinic at the provider's discretion.      For prescription refill requests, have your pharmacy contact our office and allow 72 hours for refills to be completed.    Today you received the following chemotherapy and/or immunotherapy agents: nivolumab      To help prevent nausea and vomiting after your treatment, we encourage you to take your nausea medication as directed.  BELOW ARE SYMPTOMS THAT SHOULD BE REPORTED IMMEDIATELY: *FEVER GREATER THAN 100.4 F (38 C) OR HIGHER *CHILLS OR SWEATING *NAUSEA AND VOMITING THAT IS NOT CONTROLLED WITH YOUR NAUSEA MEDICATION *UNUSUAL SHORTNESS OF BREATH *UNUSUAL BRUISING OR BLEEDING *URINARY PROBLEMS (pain or burning when urinating, or frequent urination) *BOWEL PROBLEMS (unusual diarrhea, constipation, pain near the anus) TENDERNESS IN MOUTH AND THROAT WITH OR WITHOUT PRESENCE OF ULCERS (sore throat, sores in mouth, or a toothache) UNUSUAL RASH, SWELLING OR PAIN  UNUSUAL VAGINAL DISCHARGE OR ITCHING   Items with * indicate a potential emergency and should be followed up as soon as possible or go to the Emergency Department if any problems should occur.  Please show the CHEMOTHERAPY ALERT CARD or IMMUNOTHERAPY ALERT CARD at check-in  to the Emergency Department and triage nurse.  Should you have questions after your visit or need to cancel or reschedule your appointment, please contact [facility]  Dept: [phone]  and follow the prompts.  Office hours are 8:00 a.m. to 4:30 p.m. Monday - Friday. Please note that voicemails left after 4:00 p.m. may not be returned until the following business day.  We are closed weekends and major holidays. You have access to a nurse at all times for urgent questions. Please call the main number to the clinic Dept: [phone] and follow the prompts.   For any non-urgent questions, you may also contact your provider using MyChart. We now offer e-Visits for anyone 18 and older to request care online for non-urgent symptoms. For details visit [email or web address].   Also download the MyChart app! Go to the app store, search "MyChart", open the app, select , and log in with your MyChart username and password.  Masks are optional in the cancer centers. If you would like for your care team to wear a mask while they are taking care of you, please let them know. You may have one support person who is at least 80 years old accompany you for your appointments. 

## 2022-03-13 NOTE — Progress Notes (Signed)
[facility] Follow up:   DOS: 03/13/22  PCP [name], [place], [address] [place] [address]  CC:   Follow-up for continued evaluation and management of metastatic renal cell carcinoma   DIAGNOSIS: metastatic renal cell carcinoma currently in NED status  SUMMARY OF ONCOLOGIC HISTORY: Oncology History  Cancer, 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[place])  01/10/2017 Initial Diagnosis   Cancer, 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[place])   01/02/2018 - 01/16/2022 Chemotherapy   Patient is on Treatment Plan : RENAL CELL CARCINOMA NIVOLUMAB Q14D     01/22/2022 Cancer Staging   Staging form: Bone - Appendicular Skeleton, Trunk, Skull, and Facial Bones, AJCC 8th Edition - Clinical: Stage IVB (pM1b) - Signed by Brunetta Genera, MD on 01/22/2022   02/01/2022 -  Chemotherapy   Patient is on Treatment Plan : RENAL CELL CARCINOMA Nivolumab (240) q14d/     Renal cell carcinoma, left (HCC)  04/11/2017 Initial Diagnosis   Renal cell carcinoma ([place])   01/02/2018 - 01/16/2022 Chemotherapy   The patient had nivolumab (OPDIVO) 240 mg in sodium chloride 0.9 % 100 mL chemo infusion, 240 mg, Intravenous, Once, 47 of 50 cycles Administration: 240 mg (01/02/2018), 240 mg (01/15/2018), 240 mg (01/30/2018), 240 mg (02/13/2018), 240 mg (02/27/2018), 240 mg (03/13/2018), 240 mg (03/27/2018), 240 mg (04/10/2018), 240 mg (04/24/2018), 240 mg (05/08/2018), 240 mg (05/22/2018), 240 mg (06/05/2018), 240 mg (06/19/2018), 240 mg (08/28/2018), 240 mg (09/11/2018), 240 mg (10/09/2018), 240 mg (10/22/2018), 240 mg (11/06/2018), 240 mg (11/20/2018), 240 mg (12/04/2018), 240 mg (12/18/2018), 240 mg (01/01/2019), 240 mg (01/15/2019), 240 mg (01/29/2019), 240 mg (02/12/2019), 240 mg (02/26/2019), 240 mg (03/12/2019), 240 mg (03/26/2019), 240 mg (04/09/2019), 240 mg (04/28/2019), 240 mg (05/12/2019), 240 mg (05/27/2019), 240 mg (06/09/2019), 240 mg (06/23/2019), 240 mg (07/07/2019), 240 mg (07/21/2019), 240 mg (08/04/2019), 240 mg (08/18/2019), 240 mg  (09/03/2019), 240 mg (09/15/2019), 240 mg (09/29/2019), 240 mg (10/12/2019), 240 mg (10/27/2019), 240 mg (11/10/2019), 240 mg (12/08/2019), 240 mg (12/22/2019), 240 mg (01/19/2020)  for chemotherapy treatment.    02/01/2022 -  Chemotherapy   Patient is on Treatment Plan : RENAL CELL CARCINOMA Nivolumab (240) q14d/       CURRENT THERAPY: metastatic renal cell carcinoma, Delton See and Nivolumab  INTERVAL HISTORY:  Audrey [name] 81 y.o. female is here for continued evaluation and management of metastatic renal cell carcinoma. She is here to start cycle 4 of Xgeva and Nivolumab.   Patient was last seen by me on 01/16/2022 and was doing well overall, except chronic lower extremity discomfort from her peripheral arterial disease and neuropathy.   Patient reports she is doing well without any new medical concerns. She has been taking care of her husband who has dementia. She reports mild stress and mild insomnia.   She denies loss of appetite, fever, chills, shortness of breath, new lumps/bumps, back pain, abdominal pain, and leg swelling.   She reports her neuropathy is the same since our last visit. She complains of pain due to neuropathy during today's visit. She has tried Gabapentin and Cymbalta, but discontinued due to side effects.    Patient Active Problem List   Diagnosis Date Noted   Atrophy of thyroid (acquired) 01/15/2022   At risk for falls 01/15/2022   CKD (chronic kidney disease) 01/15/2022   Diabetic ulcer of foot with fat layer exposed ([place]) 01/15/2022   Dizziness 01/15/2022   Dysuria 01/15/2022   Knee osteoarthritis 01/15/2022   Pulmonary nodule 01/15/2022   Type 2 diabetes mellitus (  [place]) 08/15/2021   Incisional hernia 06/23/2020   Gastritis and gastroduodenitis 02/26/2020   Rectal discomfort 02/26/2020   History of colonic polyps 02/26/2020   Ventral hernia 11/10/2019   History of cholecystectomy 07/17/2019   Degeneration of lumbar intervertebral disc 11/12/2018   Anemia 10/10/2018    Chronic cholecystitis 09/04/2018   Acute cholecystitis 08/29/2018   Biliary stricture 08/23/2018   Medicare annual wellness visit, subsequent 08/14/2018   CAD (coronary artery disease) 08/14/2018   Dyslipidemia 08/14/2018   Abnormal LFTs 08/05/2018   Choledocholithiasis 08/05/2018   Dilated bile duct 08/05/2018   History of ERCP 08/05/2018   History of renal cell carcinoma 08/05/2018   Port-A-Cath in place 01/30/2018   Counseling regarding advance care planning and goals of care 01/04/2018   Renal cell carcinoma, left ([place]) 04/11/2017   Cancer, 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[place]) 01/10/2017   Left renal mass 08/01/2016   Hyperlipidemia 05/23/2015   Essential hypertension 05/23/2015   Hypothyroidism 05/23/2015    is allergic to doxycycline, adhesive [tape], duloxetine, gabapentin (once-daily), nsaids, and statins.  MEDICAL HISTORY: Past Medical History:  Diagnosis Date   Anemia    Arthritis    lower back, hips, hands   Biliary stricture    Diabetes mellitus ([place])    type 2    Early cataracts, bilateral    Md just watching   Elevated liver enzymes    Family history of adverse reaction to anesthesia    Daughter hard to wake up   Gallstones    GERD (gastroesophageal reflux disease)    occasional - diet controlled   History of blood transfusion 2018   History of hiatal hernia    HTN (hypertension)    Hyperlipidemia    Hypothyroidism    left renal ca dx'd 2018   renal cancer - left kidney removed, pill chemo x 1 yr   Myocardial infarction ([place]) 1991   no deficits   SVD (spontaneous vaginal delivery)    x 3   Wears glasses     SURGICAL HISTORY: Past Surgical History:  Procedure Laterality Date   BALLOON DILATION N/A 07/23/2018   Procedure: BALLOON DILATION;  Surgeon: Mansouraty, Telford Nab., MD;  Location: [facility];  Service: Gastroenterology;  Laterality: N/A;   BILIARY BRUSHING  08/10/2018   Procedure: BILIARY BRUSHING;  Surgeon: Rush Landmark Telford Nab., MD;  Location:  [facility];  Service: Gastroenterology;;   BILIARY BRUSHING  11/30/2018   Procedure: BILIARY BRUSHING;  Surgeon: Irving Copas., MD;  Location: [facility];  Service: Gastroenterology;;   BILIARY DILATION  08/10/2018   Procedure: BILIARY DILATION;  Surgeon: Irving Copas., MD;  Location: [facility];  Service: Gastroenterology;;   BILIARY DILATION  08/27/2018   Procedure: BILIARY DILATION;  Surgeon: Irving Copas., MD;  Location: [facility];  Service: Gastroenterology;;   BILIARY DILATION  01/24/2020   Procedure: BILIARY DILATION;  Surgeon: Irving Copas., MD;  Location: [facility];  Service: Gastroenterology;;   BILIARY STENT PLACEMENT  08/10/2018   Procedure: BILIARY STENT PLACEMENT;  Surgeon: Irving Copas., MD;  Location: [facility];  Service: Gastroenterology;;   BILIARY STENT PLACEMENT  08/27/2018   Procedure: BILIARY STENT PLACEMENT;  Surgeon: Irving Copas., MD;  Location: [facility];  Service: Gastroenterology;;   BILIARY STENT PLACEMENT  11/30/2018   Procedure: BILIARY STENT PLACEMENT;  Surgeon: Irving Copas., MD;  Location: [place];  Service: Gastroenterology;;   BIOPSY  07/23/2018   Procedure: BIOPSY;  Surgeon: Irving Copas., MD;  Location: [facility];  Service: Gastroenterology;;   BIOPSY  01/24/2020   Procedure: BIOPSY;  Surgeon: Irving Copas., MD;  Location: [facility];  Service: Gastroenterology;;   CHOLECYSTECTOMY N/A 09/02/2018   Procedure: LAPAROSCOPIC CHOLECYSTECTOMY;  Surgeon: Stark Klein, MD;  Location: [facility];  Service: General;  Laterality: N/A;   COLONOSCOPY     normal    ENDOSCOPIC RETROGRADE CHOLANGIOPANCREATOGRAPHY (ERCP) WITH PROPOFOL N/A 08/10/2018   Procedure: ENDOSCOPIC RETROGRADE CHOLANGIOPANCREATOGRAPHY (ERCP) WITH PROPOFOL;  Surgeon: Irving Copas., MD;  Location: [facility];  Service: Gastroenterology;  Laterality: N/A;   ENDOSCOPIC RETROGRADE  CHOLANGIOPANCREATOGRAPHY (ERCP) WITH PROPOFOL N/A 11/30/2018   Procedure: ENDOSCOPIC RETROGRADE CHOLANGIOPANCREATOGRAPHY (ERCP) WITH PROPOFOL;  Surgeon: Rush Landmark Telford Nab., MD;  Location: [facility];  Service: Gastroenterology;  Laterality: N/A;   ENDOSCOPIC RETROGRADE CHOLANGIOPANCREATOGRAPHY (ERCP) WITH PROPOFOL N/A 01/24/2020   Procedure: ENDOSCOPIC RETROGRADE CHOLANGIOPANCREATOGRAPHY (ERCP) WITH PROPOFOL;  Surgeon: Rush Landmark Telford Nab., MD;  Location: [facility];  Service: Gastroenterology;  Laterality: N/A;   ERCP N/A 07/23/2018   Procedure: ENDOSCOPIC RETROGRADE CHOLANGIOPANCREATOGRAPHY (ERCP);  Surgeon: Irving Copas., MD;  Location: [facility];  Service: Gastroenterology;  Laterality: N/A;   ERCP N/A 08/27/2018   Procedure: ENDOSCOPIC RETROGRADE CHOLANGIOPANCREATOGRAPHY (ERCP);  Surgeon: Irving Copas., MD;  Location: [facility];  Service: Gastroenterology;  Laterality: N/A;   ESOPHAGOGASTRODUODENOSCOPY N/A 01/24/2020   Procedure: ESOPHAGOGASTRODUODENOSCOPY (EGD);  Surgeon: Irving Copas., MD;  Location: [facility];  Service: Gastroenterology;  Laterality: N/A;   ESOPHAGOGASTRODUODENOSCOPY (EGD) WITH PROPOFOL N/A 08/27/2018   Procedure: ESOPHAGOGASTRODUODENOSCOPY (EGD) WITH PROPOFOL;  Surgeon: Rush Landmark Telford Nab., MD;  Location: [facility];  Service: Gastroenterology;  Laterality: N/A;   EUS N/A 08/27/2018   Procedure: ESOPHAGEAL ENDOSCOPIC ULTRASOUND (EUS) RADIAL;  Surgeon: Rush Landmark Telford Nab., MD;  Location: [facility];  Service: Gastroenterology;  Laterality: N/A;   FINE NEEDLE ASPIRATION  08/27/2018   Procedure: FINE NEEDLE ASPIRATION (FNA) LINEAR;  Surgeon: Irving Copas., MD;  Location: [facility] [place];  Service: Gastroenterology;;   Fatima Blank HERNIA REPAIR N/A 06/23/2020   Procedure: [place];  Surgeon: Felicie Morn, MD;  Location: [facility];  Service: General;  Laterality: N/A;  ROOM 2 STARTING AT  11:00AM FOR 90 MIN   IR IMAGING GUIDED PORT INSERTION  01/08/2018   LAPAROSCOPIC NEPHRECTOMY Left 08/01/2016   Procedure: LAPAROSCOPIC  RADICAL NEPHRECTOMY/ REPAIR OF UMBILICAL HERNIA;  Surgeon: Raynelle Bring, MD;  Location: [facility];  Service: Urology;  Laterality: Left;   REMOVAL OF STONES  07/23/2018   Procedure: REMOVAL OF GALL STONES;  Surgeon: Rush Landmark Telford Nab., MD;  Location: [facility];  Service: Gastroenterology;;   REMOVAL OF STONES  08/10/2018   Procedure: REMOVAL OF STONES;  Surgeon: Irving Copas., MD;  Location: [facility];  Service: Gastroenterology;;   REMOVAL OF STONES  08/27/2018   Procedure: REMOVAL OF STONES;  Surgeon: Irving Copas., MD;  Location: [facility];  Service: Gastroenterology;;   REMOVAL OF STONES  01/24/2020   Procedure: REMOVAL OF STONES;  Surgeon: Irving Copas., MD;  Location: [facility];  Service: Gastroenterology;;   [name]  07/23/2018   Procedure: [name];  Surgeon: Irving Copas., MD;  Location: [facility];  Service: Gastroenterology;;   Lavell Islam REMOVAL  08/27/2018   Procedure: STENT REMOVAL;  Surgeon: Irving Copas., MD;  Location: [facility];  Service: Gastroenterology;;   Lavell Islam REMOVAL  11/30/2018   Procedure: STENT REMOVAL;  Surgeon: Irving Copas., MD;  Location: [facility];  Service: Gastroenterology;;   Lavell Islam REMOVAL  01/24/2020  Procedure: STENT REMOVAL;  Surgeon: Irving Copas., MD;  Location: [facility];  Service: Gastroenterology;;   UPPER GI ENDOSCOPY     x 1   VAGINAL PROLAPSE REPAIR  11/18/2019   [facility]    SOCIAL HISTORY: Social History   Socioeconomic History   Marital status: Married    Spouse name: Not on file   Number of children: 3   Years of education: Not on file   Highest education level: Not on file  Occupational History   Occupation: retired  Tobacco Use   Smoking status: Former    Packs/day: 1.00    Years: 8.00    Total pack  years: 8.00    Types: Cigarettes    Quit date: 06/18/1964    Years since quitting: 57.7   Smokeless tobacco: Never  Vaping Use   Vaping Use: Never used  Substance and Sexual Activity   Alcohol use: No   Drug use: No   Sexual activity: Not Currently    Birth control/protection: Post-menopausal  Other Topics Concern   Not on file  Social History Narrative   Married.  Three children   Social Determinants of Health   Financial Resource Strain: Not on file  Food Insecurity: Not on file  Transportation Needs: Not on file  Physical Activity: Not on file  Stress: Not on file  Social Connections: Not on file  Intimate Partner Violence: Not on file    FAMILY HISTORY: Family History  Problem Relation Age of Onset   Heart attack Father 40   Heart disease Brother 42       CABG   Colon cancer Neg Hx    Esophageal cancer Neg Hx    Inflammatory bowel disease Neg Hx    Liver disease Neg Hx    Pancreatic cancer Neg Hx    Rectal cancer Neg Hx    Stomach cancer Neg Hx      ROS 10 Point review of Systems was done is negative except as noted above.  PHYSICAL EXAMINATION Vital signs reviewed Vitals:   03/13/22 0856  BP: (!) 136/58  Pulse: (!) 57  Resp: 15  Temp: 97.8 F (36.6 C)  SpO2: 99%    NAD GENERAL:alert, in no acute distress and comfortable SKIN: no acute rashes, no significant lesions EYES: conjunctiva are pink and non-injected, sclera anicteric NECK: supple, no JVD LYMPH:  no palpable lymphadenopathy in the cervical, axillary or inguinal regions LUNGS: clear to auscultation b/l with normal respiratory effort HEART: regular rate & rhythm ABDOMEN:  normoactive bowel sounds , non tender, not distended. Extremity: no pedal edema PSYCH: alert & oriented x 3 with fluent speech NEURO: no focal motor/sensory deficits  Exam performed in chair.  LABORATORY DATA: .    Latest Ref Rng & Units 03/13/2022    8:33 AM 02/27/2022    8:29 AM 02/13/2022    8:46 AM  CBC   WBC 4.0 - 10.5 K/uL 11.1  8.1  9.5   Hemoglobin 12.0 - 15.0 g/dL 13.9  14.0  13.4   Hematocrit 36.0 - 46.0 % 42.8  43.3  41.1   Platelets 150 - 400 K/uL 239  255  233    .    Latest Ref Rng & Units 02/27/2022    8:29 AM 02/13/2022    8:46 AM 02/01/2022    9:20 AM  CMP  Glucose 70 - 99 mg/dL 107  135  172   BUN 8 - 23 mg/dL 34  30  42  Creatinine 0.44 - 1.00 mg/dL 1.58  1.61  1.71   Sodium 135 - 145 mmol/L 139  140  140   Potassium 3.5 - 5.1 mmol/L 4.3  4.5  4.1   Chloride 98 - 111 mmol/L 113  111  112   CO2 22 - 32 mmol/L '20  23  21   '$ Calcium 8.9 - 10.3 mg/dL 9.3  9.1  9.1   Total Protein 6.5 - 8.1 g/dL 6.4  6.2  6.0   Total Bilirubin 0.3 - 1.2 mg/dL 0.4  0.5  0.5   Alkaline Phos 38 - 126 U/L 58  55  55   AST 15 - 41 U/L 36  28  17   ALT 0 - 44 U/L 38  39  17    RADIOLOGY    CLINICAL DATA:  Subsequent treatment strategy for metastatic renal cell carcinoma.   EXAM: NUCLEAR MEDICINE PET SKULL BASE TO THIGH   TECHNIQUE: 6.32 mCi F-18 FDG was injected intravenously. Full-ring PET imaging was performed from the skull base to thigh after the radiotracer. CT data was obtained and used for attenuation correction and anatomic localization.   Fasting blood glucose: 159 mg/dl   COMPARISON:  PET-CT 08/29/2020 and 02/09/2020.   FINDINGS: Mediastinal blood pool activity: SUV max 2.7   NECK:   No hypermetabolic cervical lymph nodes are identified.There are no lesions of the pharyngeal mucosal space. Activity associated with the muscles of phonation is within physiologic limits and similar to previous study.   Incidental CT findings: Bilateral carotid atherosclerosis.   CHEST:   There are no hypermetabolic mediastinal, hilar or axillary lymph nodes. No hypermetabolic pulmonary activity or suspicious nodularity.   Incidental CT findings: Right IJ Port-A-Cath extends to the superior cavoatrial junction. There is diffuse atherosclerosis of the aorta, great vessels and  coronary arteries. Mild linear scarring at both lung bases appears unchanged.   ABDOMEN/PELVIS:   There is no hypermetabolic activity within the liver, adrenal glands, spleen or pancreas. There is no hypermetabolic nodal activity. Stable appearance of the right kidney. No hypermetabolic activity within the left nephrectomy bed   Incidental CT findings: Diffuse aortic and branch vessel atherosclerosis, scattered colonic diverticulosis and pelvic floor laxity are again noted.   SKELETON:   There is no hypermetabolic activity to suggest osseous metastatic disease.   Incidental CT findings: Grossly stable sclerotic lesions within the thoracic spine, without hypermetabolic activity. Lower lumbar spondylosis and sacroiliac degenerative changes bilaterally.   IMPRESSION: 1. Stable PET-CT status post left nephrectomy. No evidence of local recurrence or hypermetabolic metastatic disease. 2. Stable incidental findings including coronary and Aortic Atherosclerosis (ICD10-I70.0). Pelvic floor laxity.     Electronically Signed   By: Richardean Sale M.D.   On: 04/03/2021 12:30      ASSESSMENT and PLAN:   81 y.o. 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 PET/CT 06/19/2017 -- consistent with partial metabolic response to treatment.   Rt adrenal gland bx - showed clear cell RCC   S/p CYtoreductive left radical nephrectomy and left adrenal gland resection on 08/01/2016 by Dr Alinda Money.   10/16/17 PET/CT revealed Continued improvement, with the T7 metastatic lesion no longer significantly hypermetabolic. The previous right lower lobe pulmonary nodule is even less apparent, perhaps about 2 mm in diameter today; given that this measured 6 mm on 03/28/2017 this probably represents an effectively treated metastatic  lesion. Currently no appreciable hypermetabolic  activity is identified to suggest active malignancy. Distended gallbladder with gallbladder wall thickening and gallstones. Correlate clinically in assessing for cholecystitis. Small but abnormal amount of free pelvic fluid, nonspecific. Other imaging findings of potential clinical significance: Chronic ethmoid sinusitis. Aortic Atherosclerosis. Stable 5 mm right middle lobe pulmonary nodule, not hypermetabolic but below sensitive PET-CT size thresholds. Left nephrectomy. Notable pelvic floor laxity with cystocele. Chronic bilateral Sacroiliitis.   04/03/18 PET/CT revealed No evidence for new or progressive hypermetabolic disease on today's study to suggest metastatic progression. 2. Stable appearance of the T7 metastatic lesion without Hypermetabolism. 3. Tiny focus of FDG accumulation identified along the skin of the low right inguinal fold. No associated lesion evident on CT. This may be related to urinary contaminant. 4. Cholelithiasis with similar appearance of diffuse gallbladder wall thickening. 5. Stable 5 mm right middle lobe pulmonary nodule. 6.  Aortic Atherosclerois. 7. Diffuse colonic diverticulosis.    09/18/18 CT A/P revealed "Common duct stent in place. Improvement to resolution in biliary duct dilatation compared to 08/29/2018. Interval cholecystectomy without acute complication. 2. Left nephrectomy, without evidence of metastatic disease. 3.  Tiny hiatal hernia. 4. Coronary artery atherosclerosis. Aortic Atherosclerosis. 5. Mild limitations secondary to lack of IV contrast. 6. Marked pelvic floor laxity with cystocele and rectal prolapse".   07/02/2019 PET/CT ([id number]) which revealed "1. 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   #3 Small pulmonary lesions -- 10/16/17 PET/CT showed pulmonary less apparent than 03/28/17 PET/CT, decreasing from 100m to 242mdiameter.   MRI brain  shows no evidence of metastatic disease   #4 T7/8 Bone metastases - received Xgeva every 4 weeks from May 2018 to June 2019. 10/16/17 PET/CT revealed improvement with T7 metastatic lesion no longer significantly hypermetabolic.  -on XgMarchelle Folks #5 ?liver mets- rpt PET/CT from 10/16/2017 shows no overt evidence of metastatic disease in the liver.   #6 Cchronic renal insufficiency Creatine stable   #7 Hemorrhoids -chronic with some bleeding --Recommended Sitz bath and OTC Anusol or Nupercaine for her hemorrhoid relief. -f/u with PCP for continued mx   #8 Hypothyroidism/Adrenal insufficiency/Diabetes -Continue being followed by Dr. KeBuddy Duty #9 h/o HTN - ?control. Patient tends to be anxious and has higher blood pressures in the clinic.   #10 CAD - positive cardiac nuclear stress test Following with cardiology and nephrology    #11 h/o Choledocholithiasis and cholelithiasis 07/10/18 USKoreabdomen revealed Biliary duct dilatation with 8 mm calculus at the level of the ampulla in the distal common bile duct. 2. Cholelithiasis. No gallbladder wall thickening or pericholecystic fluid. 3. Appearance of the liver raises concern for underlying hepatic cirrhosis. No focal liver lesions are demonstrable. 4.  Left kidney absent. 5.  Small cysts in right kidney. 6.  Aortic Atherosclerosis.   S/p ERCP on 07/23/18 with Dr. GaValarie Merinoansouraty   09/02/18 Cytopathology from ERCP was suspicious for malignant cells in bile duct, but was not definitive, other two findings were benign.   # 12 Peripheral arterial disease -- following with cardiology/vascular surgery  PLAN:   -Labs done today were reviewed in detail with the patient. CBC and CMP stable.  -Patient has no new symptoms suggestive of progression of her metastatic renal cell carcinoma at this time. -No notable toxicities from her current immunotherapy -We will continue nivolumab as per scheduled plan. -She is continue to follow with  pain management and  primary care physician. -Discussed the option to stop treatment because there is no cancer visible on her scans and she has been in NED status for >2 yrs . Patient wants to continue the treatment as of right now.  -Discussed the option to start Lyrica for her pain related to neuropathy. Patient would liketo think about this.  FOLLOW UP: As per integrated scheduling  The total time spent in the appointment was 20 minutes* .  All of the patient's questions were answered with apparent satisfaction. The patient knows to call the clinic with any problems, questions or concerns.   Sullivan Lone MD MS AAHIVMS [facility] Hematology/Oncology Physician [facility]  .*Total Encounter Time as defined by the Centers for Medicare and Medicaid Services includes, in addition to the face-to-face time of a patient visit (documented in the note above) non-face-to-face time: obtaining and reviewing outside history, ordering and reviewing medications, tests or procedures, care coordination (communications with other health care professionals or caregivers) and documentation in the medical record.   I, Param Shah,am acting as a scribe for Sullivan Lone, MD.  .I have reviewed the above documentation for accuracy and completeness, and I agree with the above. Brunetta Genera MD

## 2022-03-14 ENCOUNTER — Other Ambulatory Visit: Payer: Self-pay

## 2022-03-19 ENCOUNTER — Encounter: Payer: Self-pay | Admitting: Hematology

## 2022-03-22 ENCOUNTER — Other Ambulatory Visit: Payer: Self-pay

## 2022-03-25 ENCOUNTER — Other Ambulatory Visit: Payer: Self-pay

## 2022-03-28 ENCOUNTER — Inpatient Hospital Stay: Payer: Medicare Other | Attending: Hematology

## 2022-03-28 ENCOUNTER — Inpatient Hospital Stay: Payer: Medicare Other

## 2022-03-28 VITALS — BP 119/59 | HR 66 | Temp 98.0°F | Resp 17 | Wt 123.4 lb

## 2022-03-28 DIAGNOSIS — Z79899 Other long term (current) drug therapy: Secondary | ICD-10-CM | POA: Insufficient documentation

## 2022-03-28 DIAGNOSIS — C642 Malignant neoplasm of left kidney, except renal pelvis: Secondary | ICD-10-CM | POA: Diagnosis not present

## 2022-03-28 DIAGNOSIS — C7951 Secondary malignant neoplasm of bone: Secondary | ICD-10-CM

## 2022-03-28 DIAGNOSIS — Z5112 Encounter for antineoplastic immunotherapy: Secondary | ICD-10-CM | POA: Diagnosis present

## 2022-03-28 LAB — CBC WITH DIFFERENTIAL (CANCER CENTER ONLY)
Abs Immature Granulocytes: 0.09 10*3/uL — ABNORMAL HIGH (ref 0.00–0.07)
Basophils Absolute: 0.1 10*3/uL (ref 0.0–0.1)
Basophils Relative: 1 %
Eosinophils Absolute: 0.1 10*3/uL (ref 0.0–0.5)
Eosinophils Relative: 2 %
HCT: 41.5 % (ref 36.0–46.0)
Hemoglobin: 13.6 g/dL (ref 12.0–15.0)
Immature Granulocytes: 1 %
Lymphocytes Relative: 8 %
Lymphs Abs: 0.8 10*3/uL (ref 0.7–4.0)
MCH: 30.5 pg (ref 26.0–34.0)
MCHC: 32.8 g/dL (ref 30.0–36.0)
MCV: 93 fL (ref 80.0–100.0)
Monocytes Absolute: 1 10*3/uL (ref 0.1–1.0)
Monocytes Relative: 11 %
Neutro Abs: 7 10*3/uL (ref 1.7–7.7)
Neutrophils Relative %: 77 %
Platelet Count: 197 10*3/uL (ref 150–400)
RBC: 4.46 MIL/uL (ref 3.87–5.11)
RDW: 15.5 % (ref 11.5–15.5)
WBC Count: 9.1 10*3/uL (ref 4.0–10.5)
nRBC: 0 % (ref 0.0–0.2)

## 2022-03-28 LAB — CMP (CANCER CENTER ONLY)
ALT: 32 U/L (ref 0–44)
AST: 22 U/L (ref 15–41)
Albumin: 3.7 g/dL (ref 3.5–5.0)
Alkaline Phosphatase: 46 U/L (ref 38–126)
Anion gap: 7 (ref 5–15)
BUN: 36 mg/dL — ABNORMAL HIGH (ref 8–23)
CO2: 18 mmol/L — ABNORMAL LOW (ref 22–32)
Calcium: 8.6 mg/dL — ABNORMAL LOW (ref 8.9–10.3)
Chloride: 115 mmol/L — ABNORMAL HIGH (ref 98–111)
Creatinine: 1.59 mg/dL — ABNORMAL HIGH (ref 0.44–1.00)
GFR, Estimated: 33 mL/min — ABNORMAL LOW (ref >60)
Glucose, Bld: 138 mg/dL — ABNORMAL HIGH (ref 70–99)
Potassium: 4.2 mmol/L (ref 3.5–5.1)
Sodium: 140 mmol/L (ref 135–145)
Total Bilirubin: 0.5 mg/dL (ref 0.3–1.2)
Total Protein: 6 g/dL — ABNORMAL LOW (ref 6.5–8.1)

## 2022-03-28 MED ORDER — HEPARIN SOD (PORK) LOCK FLUSH 100 UNIT/ML IV SOLN
500.0000 [IU] | Freq: Once | INTRAVENOUS | Status: AC | PRN
  Administered 2022-03-28: 500 [IU]

## 2022-03-28 MED ORDER — SODIUM CHLORIDE 0.9 % IV SOLN
240.0000 mg | Freq: Once | INTRAVENOUS | Status: AC
  Administered 2022-03-28: 240 mg via INTRAVENOUS
  Filled 2022-03-28: qty 24

## 2022-03-28 MED ORDER — SODIUM CHLORIDE 0.9% FLUSH
10.0000 mL | INTRAVENOUS | Status: DC | PRN
  Administered 2022-03-28: 10 mL

## 2022-03-28 MED ORDER — SODIUM CHLORIDE 0.9 % IV SOLN: Freq: Once | INTRAVENOUS | Status: AC

## 2022-03-28 NOTE — Progress Notes (Signed)
Patient seen by  not seen by MD today  Vitals are within treatment parameters.  Labs reviewed: ,"and are not all within treatment parameters. Cr 1.59 and Dr Irene Limbo is aware  Per physician team, patient is ready for treatment and there are NO modifications to the treatment plan.

## 2022-03-29 ENCOUNTER — Inpatient Hospital Stay: Payer: Medicare Other

## 2022-04-09 ENCOUNTER — Encounter: Payer: Self-pay | Admitting: Hematology

## 2022-04-09 NOTE — Progress Notes (Signed)
This encounter was created in error - please disregard.

## 2022-04-10 ENCOUNTER — Inpatient Hospital Stay: Payer: Medicare Other

## 2022-04-10 VITALS — BP 138/70 | HR 63 | Temp 97.6°F | Resp 18 | Wt 124.2 lb

## 2022-04-10 DIAGNOSIS — C7951 Secondary malignant neoplasm of bone: Secondary | ICD-10-CM

## 2022-04-10 DIAGNOSIS — Z95828 Presence of other vascular implants and grafts: Secondary | ICD-10-CM

## 2022-04-10 DIAGNOSIS — C642 Malignant neoplasm of left kidney, except renal pelvis: Secondary | ICD-10-CM

## 2022-04-10 DIAGNOSIS — Z5112 Encounter for antineoplastic immunotherapy: Secondary | ICD-10-CM | POA: Diagnosis not present

## 2022-04-10 LAB — CBC WITH DIFFERENTIAL (CANCER CENTER ONLY)
Abs Immature Granulocytes: 0.13 10*3/uL — ABNORMAL HIGH (ref 0.00–0.07)
Basophils Absolute: 0.1 10*3/uL (ref 0.0–0.1)
Basophils Relative: 1 %
Eosinophils Absolute: 0.2 10*3/uL (ref 0.0–0.5)
Eosinophils Relative: 2 %
HCT: 42.8 % (ref 36.0–46.0)
Hemoglobin: 14.2 g/dL (ref 12.0–15.0)
Immature Granulocytes: 1 %
Lymphocytes Relative: 10 %
Lymphs Abs: 1 10*3/uL (ref 0.7–4.0)
MCH: 30.4 pg (ref 26.0–34.0)
MCHC: 33.2 g/dL (ref 30.0–36.0)
MCV: 91.6 fL (ref 80.0–100.0)
Monocytes Absolute: 0.9 10*3/uL (ref 0.1–1.0)
Monocytes Relative: 10 %
Neutro Abs: 7.2 10*3/uL (ref 1.7–7.7)
Neutrophils Relative %: 76 %
Platelet Count: 256 10*3/uL (ref 150–400)
RBC: 4.67 MIL/uL (ref 3.87–5.11)
RDW: 15.5 % (ref 11.5–15.5)
WBC Count: 9.4 10*3/uL (ref 4.0–10.5)
nRBC: 0 % (ref 0.0–0.2)

## 2022-04-10 LAB — CMP (CANCER CENTER ONLY)
ALT: 25 U/L (ref 0–44)
AST: 18 U/L (ref 15–41)
Albumin: 3.7 g/dL (ref 3.5–5.0)
Alkaline Phosphatase: 42 U/L (ref 38–126)
Anion gap: 4 — ABNORMAL LOW (ref 5–15)
BUN: 40 mg/dL — ABNORMAL HIGH (ref 8–23)
CO2: 20 mmol/L — ABNORMAL LOW (ref 22–32)
Calcium: 8.8 mg/dL — ABNORMAL LOW (ref 8.9–10.3)
Chloride: 115 mmol/L — ABNORMAL HIGH (ref 98–111)
Creatinine: 1.55 mg/dL — ABNORMAL HIGH (ref 0.44–1.00)
GFR, Estimated: 34 mL/min — ABNORMAL LOW (ref >60)
Glucose, Bld: 155 mg/dL — ABNORMAL HIGH (ref 70–99)
Potassium: 4.3 mmol/L (ref 3.5–5.1)
Sodium: 139 mmol/L (ref 135–145)
Total Bilirubin: 0.6 mg/dL (ref 0.3–1.2)
Total Protein: 6.3 g/dL — ABNORMAL LOW (ref 6.5–8.1)

## 2022-04-10 LAB — TSH: TSH: 1.76 u[IU]/mL (ref 0.350–4.500)

## 2022-04-10 MED ORDER — SODIUM CHLORIDE 0.9% FLUSH
10.0000 mL | Freq: Once | INTRAVENOUS | Status: AC
  Administered 2022-04-10: 10 mL via INTRAVENOUS

## 2022-04-10 MED ORDER — HEPARIN SOD (PORK) LOCK FLUSH 100 UNIT/ML IV SOLN
500.0000 [IU] | Freq: Once | INTRAVENOUS | Status: AC | PRN
  Administered 2022-04-10: 500 [IU]

## 2022-04-10 MED ORDER — SODIUM CHLORIDE 0.9 % IV SOLN
240.0000 mg | Freq: Once | INTRAVENOUS | Status: AC
  Administered 2022-04-10: 240 mg via INTRAVENOUS
  Filled 2022-04-10: qty 24

## 2022-04-10 MED ORDER — SODIUM CHLORIDE 0.9 % IV SOLN: Freq: Once | INTRAVENOUS | Status: AC

## 2022-04-10 MED ORDER — DENOSUMAB 120 MG/1.7ML ~~LOC~~ SOLN
120.0000 mg | Freq: Once | SUBCUTANEOUS | Status: AC
  Administered 2022-04-10: 120 mg via SUBCUTANEOUS
  Filled 2022-04-10: qty 1.7

## 2022-04-10 MED ORDER — SODIUM CHLORIDE 0.9% FLUSH
10.0000 mL | INTRAVENOUS | Status: DC | PRN
  Administered 2022-04-10: 10 mL

## 2022-04-10 NOTE — Progress Notes (Signed)
Per Dr. Lorenso Courier, ok to treat with elevated SCr

## 2022-04-10 NOTE — Patient Instructions (Signed)
[facility]  Discharge Instructions: Thank you for choosing [facility] to provide your oncology and hematology care.   If you have a lab appointment with the Cancer Center, please go directly to the Cancer Center and check in at the registration area.   Wear comfortable clothing and clothing appropriate for easy access to any Portacath or PICC line.   We strive to give you quality time with your provider. You may need to reschedule your appointment if you arrive late (15 or more minutes).  Arriving late affects you and other patients whose appointments are after yours.  Also, if you miss three or more appointments without notifying the office, you may be dismissed from the clinic at the provider's discretion.      For prescription refill requests, have your pharmacy contact our office and allow 72 hours for refills to be completed.    Today you received the following chemotherapy and/or immunotherapy agents opdivo      To help prevent nausea and vomiting after your treatment, we encourage you to take your nausea medication as directed.  BELOW ARE SYMPTOMS THAT SHOULD BE REPORTED IMMEDIATELY: *FEVER GREATER THAN 100.4 F (38 C) OR HIGHER *CHILLS OR SWEATING *NAUSEA AND VOMITING THAT IS NOT CONTROLLED WITH YOUR NAUSEA MEDICATION *UNUSUAL SHORTNESS OF BREATH *UNUSUAL BRUISING OR BLEEDING *URINARY PROBLEMS (pain or burning when urinating, or frequent urination) *BOWEL PROBLEMS (unusual diarrhea, constipation, pain near the anus) TENDERNESS IN MOUTH AND THROAT WITH OR WITHOUT PRESENCE OF ULCERS (sore throat, sores in mouth, or a toothache) UNUSUAL RASH, SWELLING OR PAIN  UNUSUAL VAGINAL DISCHARGE OR ITCHING   Items with * indicate a potential emergency and should be followed up as soon as possible or go to the Emergency Department if any problems should occur.  Please show the CHEMOTHERAPY ALERT CARD or IMMUNOTHERAPY ALERT CARD at check-in to the  Emergency Department and triage nurse.  Should you have questions after your visit or need to cancel or reschedule your appointment, please contact [facility]  Dept: [phone]  and follow the prompts.  Office hours are 8:00 a.m. to 4:30 p.m. Monday - Friday. Please note that voicemails left after 4:00 p.m. may not be returned until the following business day.  We are closed weekends and major holidays. You have access to a nurse at all times for urgent questions. Please call the main number to the clinic Dept: [phone] and follow the prompts.   For any non-urgent questions, you may also contact your provider using MyChart. We now offer e-Visits for anyone 18 and older to request care online for non-urgent symptoms. For details visit [email or web address].   Also download the MyChart app! Go to the app store, search "MyChart", open the app, select [facility], and log in with your MyChart username and password.  Masks are optional in the cancer centers. If you would like for your care team to wear a mask while they are taking care of you, please let them know. You may have one support person who is at least 80 years old accompany you for your appointments. 

## 2022-04-11 LAB — T4: T4, Total: 6.7 ug/dL (ref 4.5–12.0)

## 2022-04-24 ENCOUNTER — Inpatient Hospital Stay: Payer: Medicare Other

## 2022-04-24 ENCOUNTER — Inpatient Hospital Stay: Payer: Medicare Other | Attending: Hematology | Admitting: Physician Assistant

## 2022-04-24 ENCOUNTER — Telehealth: Payer: Self-pay

## 2022-04-24 VITALS — BP 134/61 | HR 57 | Temp 97.1°F | Resp 16 | Wt 127.1 lb

## 2022-04-24 DIAGNOSIS — I10 Essential (primary) hypertension: Secondary | ICD-10-CM | POA: Diagnosis not present

## 2022-04-24 DIAGNOSIS — E1140 Type 2 diabetes mellitus with diabetic neuropathy, unspecified: Secondary | ICD-10-CM | POA: Insufficient documentation

## 2022-04-24 DIAGNOSIS — E039 Hypothyroidism, unspecified: Secondary | ICD-10-CM | POA: Insufficient documentation

## 2022-04-24 DIAGNOSIS — Z5112 Encounter for antineoplastic immunotherapy: Secondary | ICD-10-CM | POA: Diagnosis not present

## 2022-04-24 DIAGNOSIS — Z8711 Personal history of peptic ulcer disease: Secondary | ICD-10-CM | POA: Insufficient documentation

## 2022-04-24 DIAGNOSIS — C642 Malignant neoplasm of left kidney, except renal pelvis: Secondary | ICD-10-CM

## 2022-04-24 DIAGNOSIS — Z9049 Acquired absence of other specified parts of digestive tract: Secondary | ICD-10-CM | POA: Insufficient documentation

## 2022-04-24 DIAGNOSIS — T148XXA Other injury of unspecified body region, initial encounter: Secondary | ICD-10-CM

## 2022-04-24 DIAGNOSIS — C7802 Secondary malignant neoplasm of left lung: Secondary | ICD-10-CM | POA: Diagnosis not present

## 2022-04-24 DIAGNOSIS — C7951 Secondary malignant neoplasm of bone: Secondary | ICD-10-CM | POA: Insufficient documentation

## 2022-04-24 DIAGNOSIS — I129 Hypertensive chronic kidney disease with stage 1 through stage 4 chronic kidney disease, or unspecified chronic kidney disease: Secondary | ICD-10-CM | POA: Insufficient documentation

## 2022-04-24 DIAGNOSIS — Z9221 Personal history of antineoplastic chemotherapy: Secondary | ICD-10-CM | POA: Diagnosis not present

## 2022-04-24 DIAGNOSIS — E1122 Type 2 diabetes mellitus with diabetic chronic kidney disease: Secondary | ICD-10-CM | POA: Diagnosis not present

## 2022-04-24 DIAGNOSIS — G6289 Other specified polyneuropathies: Secondary | ICD-10-CM | POA: Diagnosis not present

## 2022-04-24 DIAGNOSIS — E2740 Unspecified adrenocortical insufficiency: Secondary | ICD-10-CM | POA: Insufficient documentation

## 2022-04-24 DIAGNOSIS — C7972 Secondary malignant neoplasm of left adrenal gland: Secondary | ICD-10-CM | POA: Insufficient documentation

## 2022-04-24 DIAGNOSIS — K449 Diaphragmatic hernia without obstruction or gangrene: Secondary | ICD-10-CM | POA: Insufficient documentation

## 2022-04-24 DIAGNOSIS — C7801 Secondary malignant neoplasm of right lung: Secondary | ICD-10-CM | POA: Diagnosis not present

## 2022-04-24 DIAGNOSIS — N189 Chronic kidney disease, unspecified: Secondary | ICD-10-CM | POA: Insufficient documentation

## 2022-04-24 DIAGNOSIS — I2510 Atherosclerotic heart disease of native coronary artery without angina pectoris: Secondary | ICD-10-CM | POA: Insufficient documentation

## 2022-04-24 DIAGNOSIS — E785 Hyperlipidemia, unspecified: Secondary | ICD-10-CM | POA: Insufficient documentation

## 2022-04-24 DIAGNOSIS — C7971 Secondary malignant neoplasm of right adrenal gland: Secondary | ICD-10-CM | POA: Diagnosis not present

## 2022-04-24 DIAGNOSIS — Z905 Acquired absence of kidney: Secondary | ICD-10-CM | POA: Insufficient documentation

## 2022-04-24 DIAGNOSIS — Z86010 Personal history of colonic polyps: Secondary | ICD-10-CM | POA: Diagnosis not present

## 2022-04-24 DIAGNOSIS — I739 Peripheral vascular disease, unspecified: Secondary | ICD-10-CM | POA: Insufficient documentation

## 2022-04-24 DIAGNOSIS — Z95828 Presence of other vascular implants and grafts: Secondary | ICD-10-CM

## 2022-04-24 DIAGNOSIS — G629 Polyneuropathy, unspecified: Secondary | ICD-10-CM | POA: Insufficient documentation

## 2022-04-24 DIAGNOSIS — R911 Solitary pulmonary nodule: Secondary | ICD-10-CM | POA: Insufficient documentation

## 2022-04-24 LAB — CBC WITH DIFFERENTIAL (CANCER CENTER ONLY)
Abs Immature Granulocytes: 0.19 10*3/uL — ABNORMAL HIGH (ref 0.00–0.07)
Basophils Absolute: 0.1 10*3/uL (ref 0.0–0.1)
Basophils Relative: 1 %
Eosinophils Absolute: 0.4 10*3/uL (ref 0.0–0.5)
Eosinophils Relative: 5 %
HCT: 41.9 % (ref 36.0–46.0)
Hemoglobin: 13.7 g/dL (ref 12.0–15.0)
Immature Granulocytes: 2 %
Lymphocytes Relative: 11 %
Lymphs Abs: 1 10*3/uL (ref 0.7–4.0)
MCH: 30.2 pg (ref 26.0–34.0)
MCHC: 32.7 g/dL (ref 30.0–36.0)
MCV: 92.5 fL (ref 80.0–100.0)
Monocytes Absolute: 0.8 10*3/uL (ref 0.1–1.0)
Monocytes Relative: 9 %
Neutro Abs: 6.4 10*3/uL (ref 1.7–7.7)
Neutrophils Relative %: 72 %
Platelet Count: 228 10*3/uL (ref 150–400)
RBC: 4.53 MIL/uL (ref 3.87–5.11)
RDW: 15.4 % (ref 11.5–15.5)
WBC Count: 8.9 10*3/uL (ref 4.0–10.5)
nRBC: 0 % (ref 0.0–0.2)

## 2022-04-24 LAB — CMP (CANCER CENTER ONLY)
ALT: 59 U/L — ABNORMAL HIGH (ref 0–44)
AST: 38 U/L (ref 15–41)
Albumin: 3.8 g/dL (ref 3.5–5.0)
Alkaline Phosphatase: 54 U/L (ref 38–126)
Anion gap: 7 (ref 5–15)
BUN: 28 mg/dL — ABNORMAL HIGH (ref 8–23)
CO2: 18 mmol/L — ABNORMAL LOW (ref 22–32)
Calcium: 8.7 mg/dL — ABNORMAL LOW (ref 8.9–10.3)
Chloride: 115 mmol/L — ABNORMAL HIGH (ref 98–111)
Creatinine: 1.53 mg/dL — ABNORMAL HIGH (ref 0.44–1.00)
GFR, Estimated: 34 mL/min — ABNORMAL LOW (ref >60)
Glucose, Bld: 135 mg/dL — ABNORMAL HIGH (ref 70–99)
Potassium: 4.3 mmol/L (ref 3.5–5.1)
Sodium: 140 mmol/L (ref 135–145)
Total Bilirubin: 0.4 mg/dL (ref 0.3–1.2)
Total Protein: 5.9 g/dL — ABNORMAL LOW (ref 6.5–8.1)

## 2022-04-24 LAB — TSH: TSH: 1.725 u[IU]/mL (ref 0.350–4.500)

## 2022-04-24 MED ORDER — AMOXICILLIN-POT CLAVULANATE 875-125 MG PO TABS: 1.0000 | ORAL_TABLET | Freq: Two times a day (BID) | ORAL | 0 refills | Status: AC

## 2022-04-24 MED ORDER — SODIUM CHLORIDE 0.9 % IV SOLN: INTRAVENOUS | Status: DC

## 2022-04-24 MED ORDER — SODIUM CHLORIDE 0.9 % IV SOLN: Freq: Once | INTRAVENOUS | Status: AC

## 2022-04-24 MED ORDER — HEPARIN SOD (PORK) LOCK FLUSH 100 UNIT/ML IV SOLN
500.0000 [IU] | Freq: Once | INTRAVENOUS | Status: AC | PRN
  Administered 2022-04-24: 500 [IU]

## 2022-04-24 MED ORDER — SODIUM CHLORIDE 0.9 % IV SOLN
240.0000 mg | Freq: Once | INTRAVENOUS | Status: AC
  Administered 2022-04-24: 240 mg via INTRAVENOUS
  Filled 2022-04-24: qty 24

## 2022-04-24 MED ORDER — SODIUM CHLORIDE 0.9% FLUSH
10.0000 mL | INTRAVENOUS | Status: DC | PRN
  Administered 2022-04-24: 10 mL

## 2022-04-24 MED ORDER — SODIUM CHLORIDE 0.9% FLUSH
10.0000 mL | Freq: Once | INTRAVENOUS | Status: AC
  Administered 2022-04-24: 10 mL

## 2022-04-24 NOTE — Patient Instructions (Signed)
[facility]  Discharge Instructions: Thank you for choosing [facility] to provide your oncology and hematology care.   If you have a lab appointment with the Cancer Center, please go directly to the Cancer Center and check in at the registration area.   Wear comfortable clothing and clothing appropriate for easy access to any Portacath or PICC line.   We strive to give you quality time with your provider. You may need to reschedule your appointment if you arrive late (15 or more minutes).  Arriving late affects you and other patients whose appointments are after yours.  Also, if you miss three or more appointments without notifying the office, you may be dismissed from the clinic at the provider's discretion.      For prescription refill requests, have your pharmacy contact our office and allow 72 hours for refills to be completed.    Today you received the following chemotherapy and/or immunotherapy agents opdivo      To help prevent nausea and vomiting after your treatment, we encourage you to take your nausea medication as directed.  BELOW ARE SYMPTOMS THAT SHOULD BE REPORTED IMMEDIATELY: *FEVER GREATER THAN 100.4 F (38 C) OR HIGHER *CHILLS OR SWEATING *NAUSEA AND VOMITING THAT IS NOT CONTROLLED WITH YOUR NAUSEA MEDICATION *UNUSUAL SHORTNESS OF BREATH *UNUSUAL BRUISING OR BLEEDING *URINARY PROBLEMS (pain or burning when urinating, or frequent urination) *BOWEL PROBLEMS (unusual diarrhea, constipation, pain near the anus) TENDERNESS IN MOUTH AND THROAT WITH OR WITHOUT PRESENCE OF ULCERS (sore throat, sores in mouth, or a toothache) UNUSUAL RASH, SWELLING OR PAIN  UNUSUAL VAGINAL DISCHARGE OR ITCHING   Items with * indicate a potential emergency and should be followed up as soon as possible or go to the Emergency Department if any problems should occur.  Please show the CHEMOTHERAPY ALERT CARD or IMMUNOTHERAPY ALERT CARD at check-in to the  Emergency Department and triage nurse.  Should you have questions after your visit or need to cancel or reschedule your appointment, please contact [facility]  Dept: [phone]  and follow the prompts.  Office hours are 8:00 a.m. to 4:30 p.m. Monday - Friday. Please note that voicemails left after 4:00 p.m. may not be returned until the following business day.  We are closed weekends and major holidays. You have access to a nurse at all times for urgent questions. Please call the main number to the clinic Dept: [phone] and follow the prompts.   For any non-urgent questions, you may also contact your provider using MyChart. We now offer e-Visits for anyone 18 and older to request care online for non-urgent symptoms. For details visit [email or web address].   Also download the MyChart app! Go to the app store, search "MyChart", open the app, select [facility], and log in with your MyChart username and password.  Masks are optional in the cancer centers. If you would like for your care team to wear a mask while they are taking care of you, please let them know. You may have one support person who is at least 81 years old accompany you for your appointments. 

## 2022-04-24 NOTE — Telephone Encounter (Signed)
Referral [facility] faxed and confirmation received

## 2022-04-24 NOTE — Progress Notes (Signed)
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  SUMMARY OF ONCOLOGIC HISTORY: Oncology History  Cancer, 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[place])  01/10/2017 Initial Diagnosis   Cancer, 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[place])   01/02/2018 - 01/16/2022 Chemotherapy   Patient is on Treatment Plan : RENAL CELL CARCINOMA NIVOLUMAB Q14D     01/22/2022 Cancer Staging   Staging form: Bone - Appendicular Skeleton, Trunk, Skull, and Facial Bones, AJCC 8th Edition - Clinical: Stage IVB (pM1b) - Signed by Brunetta Genera, MD on 01/22/2022   02/01/2022 -  Chemotherapy   Patient is on Treatment Plan : RENAL CELL CARCINOMA Nivolumab (240) q14d/     Renal cell carcinoma, left (HCC)  04/11/2017 Initial Diagnosis   Renal cell carcinoma ([place])   01/02/2018 - 01/16/2022 Chemotherapy   The patient had nivolumab (OPDIVO) 240 mg in sodium chloride 0.9 % 100 mL chemo infusion, 240 mg, Intravenous, Once, 47 of 50 cycles Administration: 240 mg (01/02/2018), 240 mg (01/15/2018), 240 mg (01/30/2018), 240 mg (02/13/2018), 240 mg (02/27/2018), 240 mg (03/13/2018), 240 mg (03/27/2018), 240 mg (04/10/2018), 240 mg (04/24/2018), 240 mg (05/08/2018), 240 mg (05/22/2018), 240 mg (06/05/2018), 240 mg (06/19/2018), 240 mg (08/28/2018), 240 mg (09/11/2018), 240 mg (10/09/2018), 240 mg (10/22/2018), 240 mg (11/06/2018), 240 mg (11/20/2018), 240 mg (12/04/2018), 240 mg (12/18/2018), 240 mg (01/01/2019), 240 mg (01/15/2019), 240 mg (01/29/2019), 240 mg (02/12/2019), 240 mg (02/26/2019), 240 mg (03/12/2019), 240 mg (03/26/2019), 240 mg (04/09/2019), 240 mg (04/28/2019), 240 mg (05/12/2019), 240 mg (05/27/2019), 240 mg (06/09/2019), 240 mg (06/23/2019), 240 mg (07/07/2019), 240 mg (07/21/2019), 240 mg (08/04/2019), 240 mg (08/18/2019), 240 mg (09/03/2019), 240 mg (09/15/2019), 240 mg (09/29/2019), 240 mg (10/12/2019), 240 mg (10/27/2019), 240 mg  (11/10/2019), 240 mg (12/08/2019), 240 mg (12/22/2019), 240 mg (01/19/2020)  for chemotherapy treatment.    02/01/2022 -  Chemotherapy   Patient is on Treatment Plan : RENAL CELL CARCINOMA Nivolumab (240) q14d/       CURRENT THERAPY: metastatic renal cell carcinoma, Delton See and Nivolumab  INTERVAL HISTORY:  Audrey [name] 82 y.o. female is here for continued evaluation and management of metastatic renal cell carcinoma. She is here to start cycle 7 of Xgeva and Nivolumab.  [name] is doing well well except for small wound noted on her right 4th toe with mild nonpurulent discharge. She has some erythema on the 4th toe as well. She completed a 10 day course of cephelexin about a week ago with minimal improvement. She was previously seen by wound care at [facility] in [place] but has not returned recently. 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 She denies any changes to her energy or appetite. She is otherwise feeling well. She denies fevers, chills, sweats, shortness of breath, chest pain or cough. She has no other complaints.    Patient Active Problem List   Diagnosis Date Noted   Atrophy of thyroid (acquired) 01/15/2022   At risk for falls 01/15/2022   CKD (chronic kidney disease) 01/15/2022   Diabetic ulcer of foot with fat layer exposed ([place]) 01/15/2022   Dizziness 01/15/2022   Dysuria 01/15/2022   Knee osteoarthritis 01/15/2022   Pulmonary nodule 01/15/2022   Type 2 diabetes mellitus ([place]) 08/15/2021   Incisional hernia 06/23/2020   Gastritis and gastroduodenitis 02/26/2020   Rectal discomfort 02/26/2020   History of colonic polyps  02/26/2020   Ventral hernia 11/10/2019   History of cholecystectomy 07/17/2019   Degeneration of lumbar intervertebral disc 11/12/2018   Anemia 10/10/2018   Chronic cholecystitis 09/04/2018   Acute cholecystitis 08/29/2018   Biliary stricture 08/23/2018   Medicare annual wellness visit, subsequent 08/14/2018   CAD  (coronary artery disease) 08/14/2018   Dyslipidemia 08/14/2018   Abnormal LFTs 08/05/2018   Choledocholithiasis 08/05/2018   Dilated bile duct 08/05/2018   History of ERCP 08/05/2018   History of renal cell carcinoma 08/05/2018   Port-A-Cath in place 01/30/2018   Counseling regarding advance care planning and goals of care 01/04/2018   Renal cell carcinoma, left ([place]) 04/11/2017   Cancer, 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[place]) 01/10/2017   Left renal mass 08/01/2016   Hyperlipidemia 05/23/2015   Essential hypertension 05/23/2015   Hypothyroidism 05/23/2015    is allergic to doxycycline, adhesive [tape], duloxetine, gabapentin (once-daily), nsaids, and statins.  MEDICAL HISTORY: Past Medical History:  Diagnosis Date   Anemia    Arthritis    lower back, hips, hands   Biliary stricture    Diabetes mellitus ([place])    type 2    Early cataracts, bilateral    Md just watching   Elevated liver enzymes    Family history of adverse reaction to anesthesia    Daughter hard to wake up   Gallstones    GERD (gastroesophageal reflux disease)    occasional - diet controlled   History of blood transfusion 2018   History of hiatal hernia    HTN (hypertension)    Hyperlipidemia    Hypothyroidism    left renal ca dx'd 2018   renal cancer - left kidney removed, pill chemo x 1 yr   Myocardial infarction ([place]) 1991   no deficits   SVD (spontaneous vaginal delivery)    x 3   Wears glasses     SURGICAL HISTORY: Past Surgical History:  Procedure Laterality Date   BALLOON DILATION N/A 07/23/2018   Procedure: BALLOON DILATION;  Surgeon: Mansouraty, Telford Nab., MD;  Location: [facility];  Service: Gastroenterology;  Laterality: N/A;   BILIARY BRUSHING  08/10/2018   Procedure: BILIARY BRUSHING;  Surgeon: Rush Landmark Telford Nab., MD;  Location: [facility];  Service: Gastroenterology;;   BILIARY BRUSHING  11/30/2018   Procedure: BILIARY BRUSHING;  Surgeon: Irving Copas., MD;  Location: [facility];  Service: Gastroenterology;;   BILIARY DILATION  08/10/2018   Procedure: BILIARY DILATION;  Surgeon: Irving Copas., MD;  Location: [facility];  Service: Gastroenterology;;   BILIARY DILATION  08/27/2018   Procedure: BILIARY DILATION;  Surgeon: Irving Copas., MD;  Location: [facility];  Service: Gastroenterology;;   BILIARY DILATION  01/24/2020   Procedure: BILIARY DILATION;  Surgeon: Irving Copas., MD;  Location: [facility];  Service: Gastroenterology;;   BILIARY STENT PLACEMENT  08/10/2018   Procedure: BILIARY STENT PLACEMENT;  Surgeon: Irving Copas., MD;  Location: [facility];  Service: Gastroenterology;;   BILIARY STENT PLACEMENT  08/27/2018   Procedure: BILIARY STENT PLACEMENT;  Surgeon: Irving Copas., MD;  Location: [facility];  Service: Gastroenterology;;   BILIARY STENT PLACEMENT  11/30/2018   Procedure: BILIARY STENT PLACEMENT;  Surgeon: Irving Copas., MD;  Location: [facility];  Service: Gastroenterology;;   BIOPSY  07/23/2018   Procedure: BIOPSY;  Surgeon: Irving Copas., MD;  Location: [facility];  Service: Gastroenterology;;   BIOPSY  01/24/2020   Procedure: BIOPSY;  Surgeon: Irving Copas., MD;  Location: [facility];  Service:  Gastroenterology;;   CHOLECYSTECTOMY N/A 09/02/2018   Procedure: LAPAROSCOPIC CHOLECYSTECTOMY;  Surgeon: Stark Klein, MD;  Location: [facility];  Service: General;  Laterality: N/A;   COLONOSCOPY     normal    ENDOSCOPIC RETROGRADE CHOLANGIOPANCREATOGRAPHY (ERCP) WITH PROPOFOL N/A 08/10/2018   Procedure: ENDOSCOPIC RETROGRADE CHOLANGIOPANCREATOGRAPHY (ERCP) WITH PROPOFOL;  Surgeon: Irving Copas., MD;  Location: [facility];  Service: Gastroenterology;  Laterality: N/A;   ENDOSCOPIC RETROGRADE CHOLANGIOPANCREATOGRAPHY (ERCP) WITH PROPOFOL N/A 11/30/2018   Procedure: ENDOSCOPIC RETROGRADE CHOLANGIOPANCREATOGRAPHY (ERCP) WITH PROPOFOL;  Surgeon: Rush Landmark  Telford Nab., MD;  Location: [facility];  Service: Gastroenterology;  Laterality: N/A;   ENDOSCOPIC RETROGRADE CHOLANGIOPANCREATOGRAPHY (ERCP) WITH PROPOFOL N/A 01/24/2020   Procedure: ENDOSCOPIC RETROGRADE CHOLANGIOPANCREATOGRAPHY (ERCP) WITH PROPOFOL;  Surgeon: Rush Landmark Telford Nab., MD;  Location: [facility];  Service: Gastroenterology;  Laterality: N/A;   ERCP N/A 07/23/2018   Procedure: ENDOSCOPIC RETROGRADE CHOLANGIOPANCREATOGRAPHY (ERCP);  Surgeon: Irving Copas., MD;  Location: [facility];  Service: Gastroenterology;  Laterality: N/A;   ERCP N/A 08/27/2018   Procedure: ENDOSCOPIC RETROGRADE CHOLANGIOPANCREATOGRAPHY (ERCP);  Surgeon: Irving Copas., MD;  Location: [facility];  Service: Gastroenterology;  Laterality: N/A;   ESOPHAGOGASTRODUODENOSCOPY N/A 01/24/2020   Procedure: ESOPHAGOGASTRODUODENOSCOPY (EGD);  Surgeon: Irving Copas., MD;  Location: [facility];  Service: Gastroenterology;  Laterality: N/A;   ESOPHAGOGASTRODUODENOSCOPY (EGD) WITH PROPOFOL N/A 08/27/2018   Procedure: ESOPHAGOGASTRODUODENOSCOPY (EGD) WITH PROPOFOL;  Surgeon: Rush Landmark Telford Nab., MD;  Location: [facility];  Service: Gastroenterology;  Laterality: N/A;   EUS N/A 08/27/2018   Procedure: ESOPHAGEAL ENDOSCOPIC ULTRASOUND (EUS) RADIAL;  Surgeon: Rush Landmark Telford Nab., MD;  Location: [facility];  Service: Gastroenterology;  Laterality: N/A;   FINE NEEDLE ASPIRATION  08/27/2018   Procedure: FINE NEEDLE ASPIRATION (FNA) LINEAR;  Surgeon: Irving Copas., MD;  Location: [facility];  Service: Gastroenterology;;   Fatima Blank HERNIA REPAIR N/A 06/23/2020   Procedure: [place];  Surgeon: Felicie Morn, MD;  Location: [facility];  Service: General;  Laterality: N/A;  ROOM 2 STARTING AT 11:00AM FOR 90 MIN   IR IMAGING GUIDED PORT INSERTION  01/08/2018   LAPAROSCOPIC NEPHRECTOMY Left 08/01/2016   Procedure: LAPAROSCOPIC  RADICAL NEPHRECTOMY/ REPAIR OF  UMBILICAL HERNIA;  Surgeon: Raynelle Bring, MD;  Location: [facility];  Service: Urology;  Laterality: Left;   REMOVAL OF STONES  07/23/2018   Procedure: REMOVAL OF GALL STONES;  Surgeon: Rush Landmark Telford Nab., MD;  Location: [facility];  Service: Gastroenterology;;   REMOVAL OF STONES  08/10/2018   Procedure: REMOVAL OF STONES;  Surgeon: Irving Copas., MD;  Location: [facility];  Service: Gastroenterology;;   REMOVAL OF STONES  08/27/2018   Procedure: REMOVAL OF STONES;  Surgeon: Irving Copas., MD;  Location: [facility];  Service: Gastroenterology;;   REMOVAL OF STONES  01/24/2020   Procedure: REMOVAL OF STONES;  Surgeon: Irving Copas., MD;  Location: [facility];  Service: Gastroenterology;;   [name]  07/23/2018   Procedure: [name];  Surgeon: Irving Copas., MD;  Location: [facility];  Service: Gastroenterology;;   Lavell Islam REMOVAL  08/27/2018   Procedure: STENT REMOVAL;  Surgeon: Irving Copas., MD;  Location: [facility];  Service: Gastroenterology;;   Lavell Islam REMOVAL  11/30/2018   Procedure: STENT REMOVAL;  Surgeon: Irving Copas., MD;  Location: [facility];  Service: Gastroenterology;;   Lavell Islam REMOVAL  01/24/2020   Procedure: STENT REMOVAL;  Surgeon: Irving Copas., MD;  Location: [facility];  Service: Gastroenterology;;   UPPER GI ENDOSCOPY  x 1   VAGINAL PROLAPSE REPAIR  11/18/2019   [facility]    SOCIAL HISTORY: Social History   Socioeconomic History   Marital status: Married    Spouse name: Not on file   Number of children: 3   Years of education: Not on file   Highest education level: Not on file  Occupational History   Occupation: retired  Tobacco Use   Smoking status: Former    Packs/day: 1.00    Years: 8.00    Total pack years: 8.00    Types: Cigarettes    Quit date: 06/18/1964    Years since quitting: 57.8   Smokeless tobacco: Never  Vaping Use   Vaping Use: Never used  Substance  and Sexual Activity   Alcohol use: No   Drug use: No   Sexual activity: Not Currently    Birth control/protection: Post-menopausal  Other Topics Concern   Not on file  Social History Narrative   Married.  Three children   Social Determinants of Health   Financial Resource Strain: Not on file  Food Insecurity: Not on file  Transportation Needs: Not on file  Physical Activity: Not on file  Stress: Not on file  Social Connections: Not on file  Intimate Partner Violence: Not on file    FAMILY HISTORY: Family History  Problem Relation Age of Onset   Heart attack Father 65   Heart disease Brother 47       CABG   Colon cancer Neg Hx    Esophageal cancer Neg Hx    Inflammatory bowel disease Neg Hx    Liver disease Neg Hx    Pancreatic cancer Neg Hx    Rectal cancer Neg Hx    Stomach cancer Neg Hx      ROS 10 Point review of Systems was done is negative except as noted above.  PHYSICAL EXAMINATION Vital signs reviewed Vitals:   04/24/22 1035  BP: 134/61  Pulse: (!) 57  Resp: 16  Temp: (!) 97.1 F (36.2 C)  SpO2: 99%    NAD GENERAL:alert, in no acute distress and comfortable SKIN: no acute rashes, no significant lesions EYES: conjunctiva are pink and non-injected, sclera anicteric LUNGS: clear to auscultation b/l with normal respiratory effort HEART: regular rate & rhythm Extremity: no pedal edema. +Erythema noted involving right 4th toe and small superficial wound with nonpurulent discharge.  PSYCH: alert & oriented x 3 with fluent speech NEURO: no focal motor/sensory deficits  Exam performed in chair.  LABORATORY DATA: .    Latest Ref Rng & Units 04/24/2022   10:13 AM 04/10/2022    8:42 AM 03/28/2022    8:55 AM  CBC  WBC 4.0 - 10.5 K/uL 8.9  9.4  9.1   Hemoglobin 12.0 - 15.0 g/dL 13.7  14.2  13.6   Hematocrit 36.0 - 46.0 % 41.9  42.8  41.5   Platelets 150 - 400 K/uL 228  256  197    .    Latest Ref Rng & Units 04/24/2022   10:13 AM 04/10/2022     8:42 AM 03/28/2022    8:55 AM  CMP  Glucose 70 - 99 mg/dL 135  155  138   BUN 8 - 23 mg/dL 28  40  36   Creatinine 0.44 - 1.00 mg/dL 1.53  1.55  1.59   Sodium 135 - 145 mmol/L 140  139  140   Potassium 3.5 - 5.1 mmol/L 4.3  4.3  4.2   Chloride  98 - 111 mmol/L 115  115  115   CO2 22 - 32 mmol/L '18  20  18   '$ Calcium 8.9 - 10.3 mg/dL 8.7  8.8  8.6   Total Protein 6.5 - 8.1 g/dL 5.9  6.3  6.0   Total Bilirubin 0.3 - 1.2 mg/dL 0.4  0.6  0.5   Alkaline Phos 38 - 126 U/L 54  42  46   AST 15 - 41 U/L 38  18  22   ALT 0 - 44 U/L 59  25  32    RADIOLOGY    CLINICAL DATA:  Subsequent treatment strategy for metastatic renal cell carcinoma.   EXAM: NUCLEAR MEDICINE PET SKULL BASE TO THIGH   TECHNIQUE: 6.32 mCi F-18 FDG was injected intravenously. Full-ring PET imaging was performed from the skull base to thigh after the radiotracer. CT data was obtained and used for attenuation correction and anatomic localization.   Fasting blood glucose: 159 mg/dl   COMPARISON:  PET-CT 08/29/2020 and 02/09/2020.   FINDINGS: Mediastinal blood pool activity: SUV max 2.7   NECK:   No hypermetabolic cervical lymph nodes are identified.There are no lesions of the pharyngeal mucosal space. Activity associated with the muscles of phonation is within physiologic limits and similar to previous study.   Incidental CT findings: Bilateral carotid atherosclerosis.   CHEST:   There are no hypermetabolic mediastinal, hilar or axillary lymph nodes. No hypermetabolic pulmonary activity or suspicious nodularity.   Incidental CT findings: Right IJ Port-A-Cath extends to the superior cavoatrial junction. There is diffuse atherosclerosis of the aorta, great vessels and coronary arteries. Mild linear scarring at both lung bases appears unchanged.   ABDOMEN/PELVIS:   There is no hypermetabolic activity within the liver, adrenal glands, spleen or pancreas. There is no hypermetabolic nodal activity. Stable  appearance of the right kidney. No hypermetabolic activity within the left nephrectomy bed   Incidental CT findings: Diffuse aortic and branch vessel atherosclerosis, scattered colonic diverticulosis and pelvic floor laxity are again noted.   SKELETON:   There is no hypermetabolic activity to suggest osseous metastatic disease.   Incidental CT findings: Grossly stable sclerotic lesions within the thoracic spine, without hypermetabolic activity. Lower lumbar spondylosis and sacroiliac degenerative changes bilaterally.   IMPRESSION: 1. Stable PET-CT status post left nephrectomy. No evidence of local recurrence or hypermetabolic metastatic disease. 2. Stable incidental findings including coronary and Aortic Atherosclerosis (ICD10-I70.0). Pelvic floor laxity.     Electronically Signed   By: Richardean Sale M.D.   On: 04/03/2021 12:30      ASSESSMENT and PLAN:  Audrey [name] is a 82 y.o. female who returns for a follow up for metastatic renal cell carcinoma.   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 -Rt adrenal gland bx - showed clear cell RCC -S/p Cytoreductive left radical nephrectomy and left adrenal gland resection on 08/01/2016 by Dr Alinda Money. -Started systemic therapy with Nivolumab 240 mg on 01/02/2018 and Xgeva on 02/27/2018.  -Most recent PET scan from 11/09/2021 showed no clear evidence of new or progressive disease.   # Hypothyroidism/Adrenal insufficiency/Diabetes -Continue being followed by Dr. Buddy Duty   #H/o Choledocholithiasis and cholelithiasis -07/10/18 US Abdomen revealed Biliary duct dilatation with 8 mm calculus at the level of the ampulla in the distal common bile duct. 2. Cholelithiasis. No gallbladder wall thickening or pericholecystic fluid. 3. Appearance of the liver raises concern for underlying hepatic cirrhosis. No  focal liver lesions are demonstrable. 4.   Left kidney absent. 5.  Small cysts in right kidney. 6.  Aortic Atherosclerosis. -S/p ERCP on 07/23/18 with Dr. Valarie Merino Mansouraty -09/02/18 Cytopathology from ERCP was suspicious for malignant cells in bile duct, but was not definitive, other two findings were benign.  #Right foot wound 2/2 PAD and DM: --Last evaluated by wound care from [facility] in [place]. --Recent completed course of cephalexin therapy approximately one week ago --Recommend follow up with wound care. Sent another round of abx therapy with Augmentin as patient is unable to tolerate doxycycline and has D2D interaction with bactrim. Trying to avoid clindamycin due to age and risk of C.diff.   PLAN:   -Labs done today were reviewed in detail with the patient. CBC unremarkable. CMP shows stable creatinine levels.  -No prohibitive toxicities with treatment so recommend to continue with Nivolumab as scheduled. -Last dose of Xgeva was given on 04/10/2022, not due today -RTC in 2 weeks for labs and follow up visit with Dr. Naidelin Gugliotta [place] before next [facility] treatment.   FOLLOW UP: As per integrated scheduling   All of the patient's questions were answered with apparent satisfaction. The patient knows to call the clinic with any problems, questions or concerns.  I have spent a total of 30 minutes minutes of face-to-face and non-face-to-face time, preparing to see the patient, performing a medically appropriate examination, counseling and educating the patient, ordering medications, referring and communicating with other health care professionals, documenting clinical information in the electronic health record,  and care coordination.   Dede Query PA-C Dept of Hematology and [place] at [facility] Phone: [phone]

## 2022-04-24 NOTE — Progress Notes (Signed)
Per Murray Hodgkins, [place] ok to proceed with elevated Scr

## 2022-04-25 LAB — T4: T4, Total: 7.2 ug/dL (ref 4.5–12.0)

## 2022-04-30 ENCOUNTER — Other Ambulatory Visit: Payer: Self-pay

## 2022-05-07 ENCOUNTER — Other Ambulatory Visit: Payer: Self-pay | Admitting: *Deleted

## 2022-05-07 DIAGNOSIS — E1122 Type 2 diabetes mellitus with diabetic chronic kidney disease: Secondary | ICD-10-CM

## 2022-05-07 DIAGNOSIS — C7951 Secondary malignant neoplasm of bone: Secondary | ICD-10-CM

## 2022-05-07 DIAGNOSIS — C642 Malignant neoplasm of left kidney, except renal pelvis: Secondary | ICD-10-CM

## 2022-05-08 ENCOUNTER — Inpatient Hospital Stay: Payer: Medicare Other

## 2022-05-08 ENCOUNTER — Inpatient Hospital Stay (HOSPITAL_BASED_OUTPATIENT_CLINIC_OR_DEPARTMENT_OTHER): Payer: Medicare Other | Admitting: Hematology

## 2022-05-08 VITALS — BP 133/65 | HR 58 | Temp 97.7°F | Resp 18 | Wt 125.6 lb

## 2022-05-08 DIAGNOSIS — C7951 Secondary malignant neoplasm of bone: Secondary | ICD-10-CM

## 2022-05-08 DIAGNOSIS — C642 Malignant neoplasm of left kidney, except renal pelvis: Secondary | ICD-10-CM | POA: Diagnosis not present

## 2022-05-08 DIAGNOSIS — E1122 Type 2 diabetes mellitus with diabetic chronic kidney disease: Secondary | ICD-10-CM

## 2022-05-08 DIAGNOSIS — Z95828 Presence of other vascular implants and grafts: Secondary | ICD-10-CM

## 2022-05-08 DIAGNOSIS — Z5112 Encounter for antineoplastic immunotherapy: Secondary | ICD-10-CM | POA: Diagnosis not present

## 2022-05-08 LAB — CMP (CANCER CENTER ONLY)
ALT: 35 U/L (ref 0–44)
AST: 19 U/L (ref 15–41)
Albumin: 3.7 g/dL (ref 3.5–5.0)
Alkaline Phosphatase: 50 U/L (ref 38–126)
Anion gap: 6 (ref 5–15)
BUN: 32 mg/dL — ABNORMAL HIGH (ref 8–23)
CO2: 21 mmol/L — ABNORMAL LOW (ref 22–32)
Calcium: 9.1 mg/dL (ref 8.9–10.3)
Chloride: 114 mmol/L — ABNORMAL HIGH (ref 98–111)
Creatinine: 1.56 mg/dL — ABNORMAL HIGH (ref 0.44–1.00)
GFR, Estimated: 33 mL/min — ABNORMAL LOW (ref >60)
Glucose, Bld: 153 mg/dL — ABNORMAL HIGH (ref 70–99)
Potassium: 4.5 mmol/L (ref 3.5–5.1)
Sodium: 141 mmol/L (ref 135–145)
Total Bilirubin: 0.5 mg/dL (ref 0.3–1.2)
Total Protein: 6.1 g/dL — ABNORMAL LOW (ref 6.5–8.1)

## 2022-05-08 LAB — CBC WITH DIFFERENTIAL (CANCER CENTER ONLY)
Abs Immature Granulocytes: 0.17 10*3/uL — ABNORMAL HIGH (ref 0.00–0.07)
Basophils Absolute: 0.1 10*3/uL (ref 0.0–0.1)
Basophils Relative: 1 %
Eosinophils Absolute: 0.5 10*3/uL (ref 0.0–0.5)
Eosinophils Relative: 5 %
HCT: 42.9 % (ref 36.0–46.0)
Hemoglobin: 14.1 g/dL (ref 12.0–15.0)
Immature Granulocytes: 2 %
Lymphocytes Relative: 12 %
Lymphs Abs: 1.3 10*3/uL (ref 0.7–4.0)
MCH: 30.3 pg (ref 26.0–34.0)
MCHC: 32.9 g/dL (ref 30.0–36.0)
MCV: 92.3 fL (ref 80.0–100.0)
Monocytes Absolute: 1 10*3/uL (ref 0.1–1.0)
Monocytes Relative: 10 %
Neutro Abs: 7.5 10*3/uL (ref 1.7–7.7)
Neutrophils Relative %: 70 %
Platelet Count: 246 10*3/uL (ref 150–400)
RBC: 4.65 MIL/uL (ref 3.87–5.11)
RDW: 15.1 % (ref 11.5–15.5)
WBC Count: 10.5 10*3/uL (ref 4.0–10.5)
nRBC: 0 % (ref 0.0–0.2)

## 2022-05-08 LAB — TSH: TSH: 1.117 u[IU]/mL (ref 0.350–4.500)

## 2022-05-08 MED ORDER — HEPARIN SOD (PORK) LOCK FLUSH 100 UNIT/ML IV SOLN
500.0000 [IU] | Freq: Once | INTRAVENOUS | Status: AC | PRN
  Administered 2022-05-08: 500 [IU]

## 2022-05-08 MED ORDER — SODIUM CHLORIDE 0.9 % IV SOLN: INTRAVENOUS | Status: AC

## 2022-05-08 MED ORDER — SODIUM CHLORIDE 0.9% FLUSH
10.0000 mL | Freq: Once | INTRAVENOUS | Status: AC
  Administered 2022-05-08: 10 mL

## 2022-05-08 MED ORDER — SODIUM CHLORIDE 0.9 % IV SOLN: Freq: Once | INTRAVENOUS | Status: DC

## 2022-05-08 MED ORDER — SODIUM CHLORIDE 0.9 % IV SOLN
240.0000 mg | Freq: Once | INTRAVENOUS | Status: AC
  Administered 2022-05-08: 240 mg via INTRAVENOUS
  Filled 2022-05-08: qty 24

## 2022-05-08 MED ORDER — SODIUM CHLORIDE 0.9% FLUSH
10.0000 mL | INTRAVENOUS | Status: DC | PRN
  Administered 2022-05-08: 10 mL

## 2022-05-08 NOTE — Patient Instructions (Signed)
[facility]  Discharge Instructions: Thank you for choosing [facility] to provide your oncology and hematology care.   If you have a lab appointment with the [facility], please go directly to the [facility] and check in at the registration area.   Wear comfortable clothing and clothing appropriate for easy access to any Portacath or PICC line.   We strive to give you quality time with your provider. You may need to reschedule your appointment if you arrive late (15 or more minutes).  Arriving late affects you and other patients whose appointments are after yours.  Also, if you miss three or more appointments without notifying the office, you may be dismissed from the clinic at the provider's discretion.      For prescription refill requests, have your pharmacy contact our office and allow 72 hours for refills to be completed.    Today you received the following chemotherapy and/or immunotherapy agents opdivo      To help prevent nausea and vomiting after your treatment, we encourage you to take your nausea medication as directed.  BELOW ARE SYMPTOMS THAT SHOULD BE REPORTED IMMEDIATELY: *FEVER GREATER THAN 100.4 F (38 C) OR HIGHER *CHILLS OR SWEATING *NAUSEA AND VOMITING THAT IS NOT CONTROLLED WITH YOUR NAUSEA MEDICATION *UNUSUAL SHORTNESS OF BREATH *UNUSUAL BRUISING OR BLEEDING *URINARY PROBLEMS (pain or burning when urinating, or frequent urination) *BOWEL PROBLEMS (unusual diarrhea, constipation, pain near the anus) TENDERNESS IN MOUTH AND THROAT WITH OR WITHOUT PRESENCE OF ULCERS (sore throat, sores in mouth, or a toothache) UNUSUAL RASH, SWELLING OR PAIN  UNUSUAL VAGINAL DISCHARGE OR ITCHING   Items with * indicate a potential emergency and should be followed up as soon as possible or go to the Emergency Department if any problems should occur.  Please show the CHEMOTHERAPY ALERT CARD or IMMUNOTHERAPY ALERT CARD at check-in to the  Emergency Department and triage nurse.  Should you have questions after your visit or need to cancel or reschedule your appointment, please contact [facility]  Dept: [phone]  and follow the prompts.  Office hours are 8:00 a.m. to 4:30 p.m. Monday - Friday. Please note that voicemails left after 4:00 p.m. may not be returned until the following business day.  We are closed weekends and major holidays. You have access to a nurse at all times for urgent questions. Please call the main number to the clinic Dept: [phone] and follow the prompts.   For any non-urgent questions, you may also contact your provider using MyChart. We now offer e-Visits for anyone 107 and older to request care online for non-urgent symptoms. For details visit [email or web address].   Also download the MyChart app! Go to the app store, search "MyChart", open the app, select [facility], and log in with your MyChart username and password.

## 2022-05-08 NOTE — Progress Notes (Signed)
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  SUMMARY OF ONCOLOGIC HISTORY: Oncology History  Cancer, 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[place])  01/10/2017 Initial Diagnosis   Cancer, 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[place])   01/02/2018 - 01/16/2022 Chemotherapy   Patient is on Treatment Plan : RENAL CELL CARCINOMA NIVOLUMAB Q14D     01/22/2022 Cancer Staging   Staging form: Bone - Appendicular Skeleton, Trunk, Skull, and Facial Bones, AJCC 8th Edition - Clinical: Stage IVB (pM1b) - Signed by Brunetta Genera, MD on 01/22/2022   02/01/2022 -  Chemotherapy   Patient is on Treatment Plan : RENAL CELL CARCINOMA Nivolumab (240) q14d/     Renal cell carcinoma, left (HCC)  04/11/2017 Initial Diagnosis   Renal cell carcinoma ([place])   01/02/2018 - 01/16/2022 Chemotherapy   The patient had nivolumab (OPDIVO) 240 mg in sodium chloride 0.9 % 100 mL chemo infusion, 240 mg, Intravenous, Once, 47 of 50 cycles Administration: 240 mg (01/02/2018), 240 mg (01/15/2018), 240 mg (01/30/2018), 240 mg (02/13/2018), 240 mg (02/27/2018), 240 mg (03/13/2018), 240 mg (03/27/2018), 240 mg (04/10/2018), 240 mg (04/24/2018), 240 mg (05/08/2018), 240 mg (05/22/2018), 240 mg (06/05/2018), 240 mg (06/19/2018), 240 mg (08/28/2018), 240 mg (09/11/2018), 240 mg (10/09/2018), 240 mg (10/22/2018), 240 mg (11/06/2018), 240 mg (11/20/2018), 240 mg (12/04/2018), 240 mg (12/18/2018), 240 mg (01/01/2019), 240 mg (01/15/2019), 240 mg (01/29/2019), 240 mg (02/12/2019), 240 mg (02/26/2019), 240 mg (03/12/2019), 240 mg (03/26/2019), 240 mg (04/09/2019), 240 mg (04/28/2019), 240 mg (05/12/2019), 240 mg (05/27/2019), 240 mg (06/09/2019), 240 mg (06/23/2019), 240 mg (07/07/2019), 240 mg (07/21/2019), 240 mg (08/04/2019), 240 mg (08/18/2019), 240 mg (09/03/2019), 240 mg (09/15/2019), 240 mg (09/29/2019), 240 mg (10/12/2019), 240 mg (10/27/2019), 240 mg  (11/10/2019), 240 mg (12/08/2019), 240 mg (12/22/2019), 240 mg (01/19/2020)  for chemotherapy treatment.    02/01/2022 -  Chemotherapy   Patient is on Treatment Plan : RENAL CELL CARCINOMA Nivolumab (240) q14d/       CURRENT THERAPY: metastatic renal cell carcinoma, Delton See and Nivolumab  INTERVAL HISTORY:  Audrey [name] 82 y.o. female is here for continued evaluation and management of metastatic renal cell carcinoma. She is here to continue her Xgeva and Nivolumab.  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   Patient notes she has been doing well overall without any new medical concerns since our last visit. She still complains of small wound on her right 4th toe with mild nonpurulent discharge. She notes that she has not been to the wound clinic. She has been to her Podiatric who has prescribed her antibiotics.   She reports that her wound on her right 4th toe does cause her mild right foot pain.   She denies fever, chills, night sweats, new skin rashes, lumps/bumps, chest pain, back pain, abdominal pain, or leg swelling.   She also complains of occasional pain due to her neuropathy.   Patient notes she is tolerating her immunotherapy without any toxicities.   Patient is currently taking care of husband who has dementia.   Patient Active Problem List   Diagnosis Date Noted   Atrophy of thyroid (acquired) 01/15/2022   At risk for falls 01/15/2022  CKD (chronic kidney disease) 01/15/2022   Diabetic ulcer of foot with fat layer exposed ([place]) 01/15/2022   Dizziness 01/15/2022   Dysuria 01/15/2022   Knee osteoarthritis 01/15/2022   Pulmonary nodule 01/15/2022   Type 2 diabetes mellitus ([place]) 08/15/2021   Incisional hernia 06/23/2020   Gastritis and gastroduodenitis 02/26/2020   Rectal  discomfort 02/26/2020   History of colonic polyps 02/26/2020   Ventral hernia 11/10/2019   History of cholecystectomy 07/17/2019   Degeneration of lumbar intervertebral disc 11/12/2018   Anemia 10/10/2018   Chronic cholecystitis 09/04/2018   Acute cholecystitis 08/29/2018   Biliary stricture 08/23/2018   Medicare annual wellness visit, subsequent 08/14/2018   CAD (coronary artery disease) 08/14/2018   Dyslipidemia 08/14/2018   Abnormal LFTs 08/05/2018   Choledocholithiasis 08/05/2018   Dilated bile duct 08/05/2018   History of ERCP 08/05/2018   History of renal cell carcinoma 08/05/2018   Port-A-Cath in place 01/30/2018   Counseling regarding advance care planning and goals of care 01/04/2018   Renal cell carcinoma, left ([place]) 04/11/2017   Cancer, 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[place]) 01/10/2017   Left renal mass 08/01/2016   Hyperlipidemia 05/23/2015   Essential hypertension 05/23/2015   Hypothyroidism 05/23/2015    is allergic to doxycycline, adhesive [tape], duloxetine, gabapentin (once-daily), nsaids, and statins.  MEDICAL HISTORY: Past Medical History:  Diagnosis Date   Anemia    Arthritis    lower back, hips, hands   Biliary stricture    Diabetes mellitus ([place])    type 2    Early cataracts, bilateral    Md just watching   Elevated liver enzymes    Family history of adverse reaction to anesthesia    Daughter hard to wake up   Gallstones    GERD (gastroesophageal reflux disease)    occasional - diet controlled   History of blood transfusion 2018   History of hiatal hernia    HTN (hypertension)    Hyperlipidemia    Hypothyroidism    left renal ca dx'd 2018   renal cancer - left kidney removed, pill chemo x 1 yr   Myocardial infarction ([place]) 1991   no deficits   SVD (spontaneous vaginal delivery)    x 3   Wears glasses     SURGICAL HISTORY: Past Surgical History:  Procedure Laterality Date   BALLOON DILATION N/A 07/23/2018   Procedure: BALLOON DILATION;  Surgeon:  Mansouraty, Telford Nab., MD;  Location: [facility];  Service: Gastroenterology;  Laterality: N/A;   BILIARY BRUSHING  08/10/2018   Procedure: BILIARY BRUSHING;  Surgeon: Rush Landmark Telford Nab., MD;  Location: [facility];  Service: Gastroenterology;;   BILIARY BRUSHING  11/30/2018   Procedure: BILIARY BRUSHING;  Surgeon: Irving Copas., MD;  Location: [facility];  Service: Gastroenterology;;   BILIARY DILATION  08/10/2018   Procedure: BILIARY DILATION;  Surgeon: Irving Copas., MD;  Location: [place];  Service: Gastroenterology;;   BILIARY DILATION  08/27/2018   Procedure: BILIARY DILATION;  Surgeon: Irving Copas., MD;  Location: [facility];  Service: Gastroenterology;;   BILIARY DILATION  01/24/2020   Procedure: BILIARY DILATION;  Surgeon: Irving Copas., MD;  Location: [place];  Service: Gastroenterology;;   BILIARY STENT PLACEMENT  08/10/2018   Procedure: BILIARY STENT PLACEMENT;  Surgeon: Irving Copas., MD;  Location: [facility];  Service: Gastroenterology;;   BILIARY STENT PLACEMENT  08/27/2018   Procedure: BILIARY STENT PLACEMENT;  Surgeon: Irving Copas., MD;  Location: [place];  Service: Gastroenterology;;   BILIARY STENT  PLACEMENT  11/30/2018   Procedure: BILIARY STENT PLACEMENT;  Surgeon: Rush Landmark Telford Nab., MD;  Location: [place];  Service: Gastroenterology;;   BIOPSY  07/23/2018   Procedure: BIOPSY;  Surgeon: Irving Copas., MD;  Location: [place];  Service: Gastroenterology;;   BIOPSY  01/24/2020   Procedure: BIOPSY;  Surgeon: Irving Copas., MD;  Location: [facility];  Service: Gastroenterology;;   CHOLECYSTECTOMY N/A 09/02/2018   Procedure: LAPAROSCOPIC CHOLECYSTECTOMY;  Surgeon: Stark Klein, MD;  Location: [facility] [place];  Service: General;  Laterality: N/A;   COLONOSCOPY     normal    ENDOSCOPIC RETROGRADE CHOLANGIOPANCREATOGRAPHY (ERCP) WITH PROPOFOL N/A 08/10/2018   Procedure:  ENDOSCOPIC RETROGRADE CHOLANGIOPANCREATOGRAPHY (ERCP) WITH PROPOFOL;  Surgeon: Irving Copas., MD;  Location: [place];  Service: Gastroenterology;  Laterality: N/A;   ENDOSCOPIC RETROGRADE CHOLANGIOPANCREATOGRAPHY (ERCP) WITH PROPOFOL N/A 11/30/2018   Procedure: ENDOSCOPIC RETROGRADE CHOLANGIOPANCREATOGRAPHY (ERCP) WITH PROPOFOL;  Surgeon: Rush Landmark Telford Nab., MD;  Location: [facility];  Service: Gastroenterology;  Laterality: N/A;   ENDOSCOPIC RETROGRADE CHOLANGIOPANCREATOGRAPHY (ERCP) WITH PROPOFOL N/A 01/24/2020   Procedure: ENDOSCOPIC RETROGRADE CHOLANGIOPANCREATOGRAPHY (ERCP) WITH PROPOFOL;  Surgeon: Rush Landmark Telford Nab., MD;  Location: [facility];  Service: Gastroenterology;  Laterality: N/A;   ERCP N/A 07/23/2018   Procedure: ENDOSCOPIC RETROGRADE CHOLANGIOPANCREATOGRAPHY (ERCP);  Surgeon: Irving Copas., MD;  Location: [place];  Service: Gastroenterology;  Laterality: N/A;   ERCP N/A 08/27/2018   Procedure: ENDOSCOPIC RETROGRADE CHOLANGIOPANCREATOGRAPHY (ERCP);  Surgeon: Irving Copas., MD;  Location: [facility];  Service: Gastroenterology;  Laterality: N/A;   ESOPHAGOGASTRODUODENOSCOPY N/A 01/24/2020   Procedure: ESOPHAGOGASTRODUODENOSCOPY (EGD);  Surgeon: Irving Copas., MD;  Location: [facility];  Service: Gastroenterology;  Laterality: N/A;   ESOPHAGOGASTRODUODENOSCOPY (EGD) WITH PROPOFOL N/A 08/27/2018   Procedure: ESOPHAGOGASTRODUODENOSCOPY (EGD) WITH PROPOFOL;  Surgeon: Rush Landmark Telford Nab., MD;  Location: [place];  Service: Gastroenterology;  Laterality: N/A;   EUS N/A 08/27/2018   Procedure: ESOPHAGEAL ENDOSCOPIC ULTRASOUND (EUS) RADIAL;  Surgeon: Rush Landmark Telford Nab., MD;  Location: [place] [facility];  Service: Gastroenterology;  Laterality: N/A;   FINE NEEDLE ASPIRATION  08/27/2018   Procedure: FINE NEEDLE ASPIRATION (FNA) LINEAR;  Surgeon: Irving Copas., MD;  Location: [facility];  Service: Gastroenterology;;    Fatima Blank HERNIA REPAIR N/A 06/23/2020   Procedure: [place];  Surgeon: Felicie Morn, MD;  Location: [facility];  Service: General;  Laterality: N/A;  ROOM 2 STARTING AT 11:00AM FOR 90 MIN   IR IMAGING GUIDED PORT INSERTION  01/08/2018   LAPAROSCOPIC NEPHRECTOMY Left 08/01/2016   Procedure: LAPAROSCOPIC  RADICAL NEPHRECTOMY/ REPAIR OF UMBILICAL HERNIA;  Surgeon: Raynelle Bring, MD;  Location: [facility];  Service: Urology;  Laterality: Left;   REMOVAL OF STONES  07/23/2018   Procedure: REMOVAL OF GALL STONES;  Surgeon: Rush Landmark Telford Nab., MD;  Location: [place];  Service: Gastroenterology;;   REMOVAL OF STONES  08/10/2018   Procedure: REMOVAL OF STONES;  Surgeon: Irving Copas., MD;  Location: [facility] [place];  Service: Gastroenterology;;   REMOVAL OF STONES  08/27/2018   Procedure: REMOVAL OF STONES;  Surgeon: Irving Copas., MD;  Location: [facility] [place];  Service: Gastroenterology;;   REMOVAL OF STONES  01/24/2020   Procedure: REMOVAL OF STONES;  Surgeon: Irving Copas., MD;  Location: [place];  Service: Gastroenterology;;   [name]  07/23/2018   Procedure: [name];  Surgeon: Irving Copas., MD;  Location: [place];  Service: Gastroenterology;;   STENT REMOVAL  08/27/2018   Procedure: STENT REMOVAL;  Surgeon: Rush Landmark,  Telford Nab., MD;  Location: [facility]COPY;  Service: Gastroenterology;;   Lavell Islam REMOVAL  11/30/2018   Procedure: STENT REMOVAL;  Surgeon: Irving Copas., MD;  Location: [place];  Service: Gastroenterology;;   Lavell Islam REMOVAL  01/24/2020   Procedure: STENT REMOVAL;  Surgeon: Irving Copas., MD;  Location: [facility];  Service: Gastroenterology;;   UPPER GI ENDOSCOPY     x 1   VAGINAL PROLAPSE REPAIR  11/18/2019   [facility]    SOCIAL HISTORY: Social History   Socioeconomic History   Marital status: Married    Spouse name: Not on file   Number of children: 3   Years  of education: Not on file   Highest education level: Not on file  Occupational History   Occupation: retired  Tobacco Use   Smoking status: Former    Packs/day: 1.00    Years: 8.00    Total pack years: 8.00    Types: Cigarettes    Quit date: 06/18/1964    Years since quitting: 57.9   Smokeless tobacco: Never  Vaping Use   Vaping Use: Never used  Substance and Sexual Activity   Alcohol use: No   Drug use: No   Sexual activity: Not Currently    Birth control/protection: Post-menopausal  Other Topics Concern   Not on file  Social History Narrative   Married.  Three children   Social Determinants of Health   Financial Resource Strain: Not on file  Food Insecurity: Not on file  Transportation Needs: Not on file  Physical Activity: Not on file  Stress: Not on file  Social Connections: Not on file  Intimate Partner Violence: Not on file    FAMILY HISTORY: Family History  Problem Relation Age of Onset   Heart attack Father 18   Heart disease Brother 64       CABG   Colon cancer Neg Hx    Esophageal cancer Neg Hx    Inflammatory bowel disease Neg Hx    Liver disease Neg Hx    Pancreatic cancer Neg Hx    Rectal cancer Neg Hx    Stomach cancer Neg Hx      ROS 10 Point review of Systems was done is negative except as noted above.  PHYSICAL EXAMINATION Vital signs reviewed There were no vitals filed for this visit.   NAD GENERAL:alert, in no acute distress and comfortable SKIN: no acute rashes, no significant lesions EYES: conjunctiva are pink and non-injected, sclera anicteric LUNGS: clear to auscultation b/l with normal respiratory effort HEART: regular rate & rhythm Extremity: no pedal edema. +Erythema noted involving right 4th toe and small superficial wound with nonpurulent discharge.  PSYCH: alert & oriented x 3 with fluent speech NEURO: no focal motor/sensory deficits  Exam performed in chair.  LABORATORY DATA: .    Latest Ref Rng & Units 04/24/2022    10:13 AM 04/10/2022    8:42 AM 03/28/2022    8:55 AM  CBC  WBC 4.0 - 10.5 K/uL 8.9  9.4  9.1   Hemoglobin 12.0 - 15.0 g/dL 13.7  14.2  13.6   Hematocrit 36.0 - 46.0 % 41.9  42.8  41.5   Platelets 150 - 400 K/uL 228  256  197    .    Latest Ref Rng & Units 04/24/2022   10:13 AM 04/10/2022    8:42 AM 03/28/2022    8:55 AM  CMP  Glucose 70 - 99 mg/dL 135  155  138  BUN 8 - 23 mg/dL 28  40  36   Creatinine 0.44 - 1.00 mg/dL 1.53  1.55  1.59   Sodium 135 - 145 mmol/L 140  139  140   Potassium 3.5 - 5.1 mmol/L 4.3  4.3  4.2   Chloride 98 - 111 mmol/L 115  115  115   CO2 22 - 32 mmol/L '18  20  18   '$ Calcium 8.9 - 10.3 mg/dL 8.7  8.8  8.6   Total Protein 6.5 - 8.1 g/dL 5.9  6.3  6.0   Total Bilirubin 0.3 - 1.2 mg/dL 0.4  0.6  0.5   Alkaline Phos 38 - 126 U/L 54  42  46   AST 15 - 41 U/L 38  18  22   ALT 0 - 44 U/L 59  25  32    RADIOLOGY    CLINICAL DATA:  Subsequent treatment strategy for metastatic renal cell carcinoma.   EXAM: NUCLEAR MEDICINE PET SKULL BASE TO THIGH   TECHNIQUE: 6.32 mCi F-18 FDG was injected intravenously. Full-ring PET imaging was performed from the skull base to thigh after the radiotracer. CT data was obtained and used for attenuation correction and anatomic localization.   Fasting blood glucose: 159 mg/dl   COMPARISON:  PET-CT 08/29/2020 and 02/09/2020.   FINDINGS: Mediastinal blood pool activity: SUV max 2.7   NECK:   No hypermetabolic cervical lymph nodes are identified.There are no lesions of the pharyngeal mucosal space. Activity associated with the muscles of phonation is within physiologic limits and similar to previous study.   Incidental CT findings: Bilateral carotid atherosclerosis.   CHEST:   There are no hypermetabolic mediastinal, hilar or axillary lymph nodes. No hypermetabolic pulmonary activity or suspicious nodularity.   Incidental CT findings: Right IJ Port-A-Cath extends to the superior cavoatrial junction. There  is diffuse atherosclerosis of the aorta, great vessels and coronary arteries. Mild linear scarring at both lung bases appears unchanged.   ABDOMEN/PELVIS:   There is no hypermetabolic activity within the liver, adrenal glands, spleen or pancreas. There is no hypermetabolic nodal activity. Stable appearance of the right kidney. No hypermetabolic activity within the left nephrectomy bed   Incidental CT findings: Diffuse aortic and branch vessel atherosclerosis, scattered colonic diverticulosis and pelvic floor laxity are again noted.   SKELETON:   There is no hypermetabolic activity to suggest osseous metastatic disease.   Incidental CT findings: Grossly stable sclerotic lesions within the thoracic spine, without hypermetabolic activity. Lower lumbar spondylosis and sacroiliac degenerative changes bilaterally.   IMPRESSION: 1. Stable PET-CT status post left nephrectomy. No evidence of local recurrence or hypermetabolic metastatic disease. 2. Stable incidental findings including coronary and Aortic Atherosclerosis (ICD10-I70.0). Pelvic floor laxity.     Electronically Signed   By: Richardean Sale M.D.   On: 04/03/2021 12:30      ASSESSMENT and PLAN:  Audrey [name] is a 82 y.o. female who returns for a follow up for metastatic renal cell carcinoma.   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 -Rt adrenal gland bx - showed clear cell RCC -S/p Cytoreductive left radical nephrectomy and left adrenal gland resection on 08/01/2016 by Dr Alinda Money. -Started systemic therapy with Nivolumab 240 mg on 01/02/2018 and Xgeva on 02/27/2018.  -Most recent PET scan from 11/09/2021 showed no clear evidence of new or progressive disease.   # Hypothyroidism/Adrenal insufficiency/Diabetes -Continue being followed by Dr. Buddy Duty   #  H/o Choledocholithiasis and cholelithiasis -07/10/18 US Abdomen  revealed Biliary duct dilatation with 8 mm calculus at the level of the ampulla in the distal common bile duct. 2. Cholelithiasis. No gallbladder wall thickening or pericholecystic fluid. 3. Appearance of the liver raises concern for underlying hepatic cirrhosis. No focal liver lesions are demonstrable. 4.  Left kidney absent. 5.  Small cysts in right kidney. 6.  Aortic Atherosclerosis. -S/p ERCP on 07/23/18 with Dr. Valarie Merino Mansouraty -09/02/18 Cytopathology from ERCP was suspicious for malignant cells in bile duct, but was not definitive, other two findings were benign.  #Right foot wound 2/2 PAD and DM: --Last evaluated by wound care from [facility] in [place]. --Recent completed course of cephalexin therapy approximately one week ago --Recommend follow up with wound care. Sent another round of abx therapy with Augmentin as patient is unable to tolerate doxycycline and has D2D interaction with bactrim. Trying to avoid clindamycin due to age and risk of C.diff.   PLAN:   -No prohibitive toxicities with treatment so recommend to continue with Nivolumab as scheduled. -discussed lab results from today, 05/08/2022, with the patient. CBC are stable. CMP is stable. -Will prescribe tramadol for her right foot pain.  -She wants to continue her immunotherapy for right now. Orders reviewed and signed for Nivolumab Continue Delton See-- will discuss switching to every 3 months Xgeva on next visit. -PET/CT in 4 weeks  FOLLOW UP: As per integrated scheduling PET/CT in 4 weeks  The total time spent in the appointment was 30 minutes* .  All of the patient's questions were answered with apparent satisfaction. The patient knows to call the clinic with any problems, questions or concerns.   Sullivan Lone MD MS AAHIVMS [facility] Hematology/Oncology Physician [facility]  .*Total Encounter Time as defined by the Centers for Medicare and Medicaid Services includes, in addition to the face-to-face time of a  patient visit (documented in the note above) non-face-to-face time: obtaining and reviewing outside history, ordering and reviewing medications, tests or procedures, care coordination (communications with other health care professionals or caregivers) and documentation in the medical record.   I, Cleda Mccreedy, am acting as a Education administrator for Sullivan Lone, MD.  .I have reviewed the above documentation for accuracy and completeness, and I agree with the above. Brunetta Genera MD

## 2022-05-08 NOTE — Progress Notes (Signed)
Patient seen by MD today  Vitals are within treatment parameters.  Labs reviewed: ,"and are not all within treatment parameters. Dr Irene Limbo aware  Cr: 1.56  Per physician team, patient is ready for treatment and there are NO modifications to the treatment plan.

## 2022-05-09 LAB — T4: T4, Total: 8.6 ug/dL (ref 4.5–12.0)

## 2022-05-12 ENCOUNTER — Other Ambulatory Visit: Payer: Self-pay

## 2022-05-14 ENCOUNTER — Encounter: Payer: Self-pay | Admitting: Hematology

## 2022-05-15 ENCOUNTER — Encounter: Payer: Self-pay | Admitting: Hematology

## 2022-05-15 ENCOUNTER — Other Ambulatory Visit: Payer: Self-pay | Admitting: Hematology

## 2022-05-15 MED ORDER — TRAMADOL HCL 50 MG PO TABS: 50.0000 mg | ORAL_TABLET | Freq: Four times a day (QID) | ORAL | 0 refills | Status: DC | PRN

## 2022-05-16 ENCOUNTER — Other Ambulatory Visit: Payer: Self-pay

## 2022-05-16 DIAGNOSIS — C642 Malignant neoplasm of left kidney, except renal pelvis: Secondary | ICD-10-CM

## 2022-05-22 ENCOUNTER — Inpatient Hospital Stay: Payer: Medicare Other

## 2022-05-22 ENCOUNTER — Other Ambulatory Visit: Payer: Self-pay

## 2022-05-22 VITALS — BP 139/71 | HR 67 | Temp 97.6°F | Resp 18 | Ht 59.0 in | Wt 124.0 lb

## 2022-05-22 DIAGNOSIS — C642 Malignant neoplasm of left kidney, except renal pelvis: Secondary | ICD-10-CM

## 2022-05-22 DIAGNOSIS — Z5112 Encounter for antineoplastic immunotherapy: Secondary | ICD-10-CM | POA: Diagnosis not present

## 2022-05-22 DIAGNOSIS — Z95828 Presence of other vascular implants and grafts: Secondary | ICD-10-CM

## 2022-05-22 DIAGNOSIS — C7951 Secondary malignant neoplasm of bone: Secondary | ICD-10-CM

## 2022-05-22 LAB — CBC WITH DIFFERENTIAL (CANCER CENTER ONLY)
Abs Immature Granulocytes: 0.05 10*3/uL (ref 0.00–0.07)
Basophils Absolute: 0.1 10*3/uL (ref 0.0–0.1)
Basophils Relative: 1 %
Eosinophils Absolute: 0.3 10*3/uL (ref 0.0–0.5)
Eosinophils Relative: 4 %
HCT: 40.1 % (ref 36.0–46.0)
Hemoglobin: 13.5 g/dL (ref 12.0–15.0)
Immature Granulocytes: 1 %
Lymphocytes Relative: 14 %
Lymphs Abs: 1.1 10*3/uL (ref 0.7–4.0)
MCH: 30.5 pg (ref 26.0–34.0)
MCHC: 33.7 g/dL (ref 30.0–36.0)
MCV: 90.5 fL (ref 80.0–100.0)
Monocytes Absolute: 0.7 10*3/uL (ref 0.1–1.0)
Monocytes Relative: 8 %
Neutro Abs: 6 10*3/uL (ref 1.7–7.7)
Neutrophils Relative %: 72 %
Platelet Count: 266 10*3/uL (ref 150–400)
RBC: 4.43 MIL/uL (ref 3.87–5.11)
RDW: 15.1 % (ref 11.5–15.5)
WBC Count: 8.2 10*3/uL (ref 4.0–10.5)
nRBC: 0 % (ref 0.0–0.2)

## 2022-05-22 LAB — CMP (CANCER CENTER ONLY)
ALT: 18 U/L (ref 0–44)
AST: 18 U/L (ref 15–41)
Albumin: 3.8 g/dL (ref 3.5–5.0)
Alkaline Phosphatase: 37 U/L — ABNORMAL LOW (ref 38–126)
Anion gap: 8 (ref 5–15)
BUN: 31 mg/dL — ABNORMAL HIGH (ref 8–23)
CO2: 20 mmol/L — ABNORMAL LOW (ref 22–32)
Calcium: 9.5 mg/dL (ref 8.9–10.3)
Chloride: 113 mmol/L — ABNORMAL HIGH (ref 98–111)
Creatinine: 1.78 mg/dL — ABNORMAL HIGH (ref 0.44–1.00)
GFR, Estimated: 29 mL/min — ABNORMAL LOW (ref >60)
Glucose, Bld: 147 mg/dL — ABNORMAL HIGH (ref 70–99)
Potassium: 3.9 mmol/L (ref 3.5–5.1)
Sodium: 141 mmol/L (ref 135–145)
Total Bilirubin: 0.6 mg/dL (ref 0.3–1.2)
Total Protein: 6.3 g/dL — ABNORMAL LOW (ref 6.5–8.1)

## 2022-05-22 LAB — SEDIMENTATION RATE: Sed Rate: 9 mm/hr (ref 0–22)

## 2022-05-22 LAB — TSH: TSH: 1.612 u[IU]/mL (ref 0.350–4.500)

## 2022-05-22 MED ORDER — HEPARIN SOD (PORK) LOCK FLUSH 100 UNIT/ML IV SOLN
500.0000 [IU] | Freq: Once | INTRAVENOUS | Status: AC | PRN
  Administered 2022-05-22: 500 [IU]

## 2022-05-22 MED ORDER — DENOSUMAB 120 MG/1.7ML ~~LOC~~ SOLN
120.0000 mg | Freq: Once | SUBCUTANEOUS | Status: AC
  Administered 2022-05-22: 120 mg via SUBCUTANEOUS
  Filled 2022-05-22: qty 1.7

## 2022-05-22 MED ORDER — SODIUM CHLORIDE 0.9 % IV SOLN: Freq: Once | INTRAVENOUS | Status: AC

## 2022-05-22 MED ORDER — SODIUM CHLORIDE 0.9 % IV SOLN
240.0000 mg | Freq: Once | INTRAVENOUS | Status: AC
  Administered 2022-05-22: 240 mg via INTRAVENOUS
  Filled 2022-05-22: qty 24

## 2022-05-22 MED ORDER — SODIUM CHLORIDE 0.9% FLUSH
10.0000 mL | Freq: Once | INTRAVENOUS | Status: AC
  Administered 2022-05-22: 10 mL

## 2022-05-22 MED ORDER — SODIUM CHLORIDE 0.9 % IV SOLN: INTRAVENOUS | Status: DC

## 2022-05-22 MED ORDER — SODIUM CHLORIDE 0.9% FLUSH
10.0000 mL | INTRAVENOUS | Status: DC | PRN
  Administered 2022-05-22: 10 mL

## 2022-05-22 NOTE — Patient Instructions (Signed)
[facility]  Discharge Instructions: Thank you for choosing [facility] to provide your oncology and hematology care.   If you have a lab appointment with the Cancer Center, please go directly to the Cancer Center and check in at the registration area.   Wear comfortable clothing and clothing appropriate for easy access to any Portacath or PICC line.   We strive to give you quality time with your provider. You may need to reschedule your appointment if you arrive late (15 or more minutes).  Arriving late affects you and other patients whose appointments are after yours.  Also, if you miss three or more appointments without notifying the office, you may be dismissed from the clinic at the provider's discretion.      For prescription refill requests, have your pharmacy contact our office and allow 72 hours for refills to be completed.    Today you received the following chemotherapy and/or immunotherapy agents opdivo      To help prevent nausea and vomiting after your treatment, we encourage you to take your nausea medication as directed.  BELOW ARE SYMPTOMS THAT SHOULD BE REPORTED IMMEDIATELY: *FEVER GREATER THAN 100.4 F (38 C) OR HIGHER *CHILLS OR SWEATING *NAUSEA AND VOMITING THAT IS NOT CONTROLLED WITH YOUR NAUSEA MEDICATION *UNUSUAL SHORTNESS OF BREATH *UNUSUAL BRUISING OR BLEEDING *URINARY PROBLEMS (pain or burning when urinating, or frequent urination) *BOWEL PROBLEMS (unusual diarrhea, constipation, pain near the anus) TENDERNESS IN MOUTH AND THROAT WITH OR WITHOUT PRESENCE OF ULCERS (sore throat, sores in mouth, or a toothache) UNUSUAL RASH, SWELLING OR PAIN  UNUSUAL VAGINAL DISCHARGE OR ITCHING   Items with * indicate a potential emergency and should be followed up as soon as possible or go to the Emergency Department if any problems should occur.  Please show the CHEMOTHERAPY ALERT CARD or IMMUNOTHERAPY ALERT CARD at check-in  to the Emergency Department and triage nurse.  Should you have questions after your visit or need to cancel or reschedule your appointment, please contact [facility]  Dept: [phone]  and follow the prompts.  Office hours are 8:00 a.m. to 4:30 p.m. Monday - Friday. Please note that voicemails left after 4:00 p.m. may not be returned until the following business day.  We are closed weekends and major holidays. You have access to a nurse at all times for urgent questions. Please call the main number to the clinic Dept: [phone] and follow the prompts.   For any non-urgent questions, you may also contact your provider using MyChart. We now offer e-Visits for anyone 18 and older to request care online for non-urgent symptoms. For details visit [email or web address].   Also download the MyChart app! Go to the app store, search "MyChart", open the app, select [facility], and log in with your MyChart username and password.   

## 2022-05-22 NOTE — Progress Notes (Signed)
Patient seen by  Not seen by MD today  Vitals are within treatment parameters.  Labs reviewed: ,"and are not all within treatment parameters. Dr Irene Limbo aware CR: 1.78  Per physician team, patient is ready for treatment and there are NO modifications to the treatment plan.

## 2022-05-23 ENCOUNTER — Inpatient Hospital Stay: Payer: Medicare Other | Attending: Hematology

## 2022-05-23 DIAGNOSIS — C7972 Secondary malignant neoplasm of left adrenal gland: Secondary | ICD-10-CM | POA: Insufficient documentation

## 2022-05-23 DIAGNOSIS — C7801 Secondary malignant neoplasm of right lung: Secondary | ICD-10-CM | POA: Insufficient documentation

## 2022-05-23 DIAGNOSIS — Z9221 Personal history of antineoplastic chemotherapy: Secondary | ICD-10-CM | POA: Insufficient documentation

## 2022-05-23 DIAGNOSIS — C7951 Secondary malignant neoplasm of bone: Secondary | ICD-10-CM | POA: Insufficient documentation

## 2022-05-23 DIAGNOSIS — Z5112 Encounter for antineoplastic immunotherapy: Secondary | ICD-10-CM | POA: Insufficient documentation

## 2022-05-23 DIAGNOSIS — E1136 Type 2 diabetes mellitus with diabetic cataract: Secondary | ICD-10-CM | POA: Insufficient documentation

## 2022-05-23 DIAGNOSIS — E039 Hypothyroidism, unspecified: Secondary | ICD-10-CM | POA: Insufficient documentation

## 2022-05-23 DIAGNOSIS — E1122 Type 2 diabetes mellitus with diabetic chronic kidney disease: Secondary | ICD-10-CM | POA: Insufficient documentation

## 2022-05-23 DIAGNOSIS — C649 Malignant neoplasm of unspecified kidney, except renal pelvis: Secondary | ICD-10-CM | POA: Insufficient documentation

## 2022-05-23 DIAGNOSIS — Z87891 Personal history of nicotine dependence: Secondary | ICD-10-CM | POA: Insufficient documentation

## 2022-05-23 DIAGNOSIS — I129 Hypertensive chronic kidney disease with stage 1 through stage 4 chronic kidney disease, or unspecified chronic kidney disease: Secondary | ICD-10-CM | POA: Insufficient documentation

## 2022-05-23 DIAGNOSIS — E785 Hyperlipidemia, unspecified: Secondary | ICD-10-CM | POA: Insufficient documentation

## 2022-05-23 DIAGNOSIS — I2510 Atherosclerotic heart disease of native coronary artery without angina pectoris: Secondary | ICD-10-CM | POA: Insufficient documentation

## 2022-05-23 DIAGNOSIS — N281 Cyst of kidney, acquired: Secondary | ICD-10-CM | POA: Insufficient documentation

## 2022-05-23 DIAGNOSIS — C7802 Secondary malignant neoplasm of left lung: Secondary | ICD-10-CM | POA: Insufficient documentation

## 2022-05-23 DIAGNOSIS — I252 Old myocardial infarction: Secondary | ICD-10-CM | POA: Insufficient documentation

## 2022-05-23 DIAGNOSIS — K219 Gastro-esophageal reflux disease without esophagitis: Secondary | ICD-10-CM | POA: Insufficient documentation

## 2022-05-23 DIAGNOSIS — I700 Atherosclerosis of aorta: Secondary | ICD-10-CM | POA: Insufficient documentation

## 2022-05-23 DIAGNOSIS — C7971 Secondary malignant neoplasm of right adrenal gland: Secondary | ICD-10-CM | POA: Insufficient documentation

## 2022-05-23 DIAGNOSIS — N189 Chronic kidney disease, unspecified: Secondary | ICD-10-CM | POA: Insufficient documentation

## 2022-05-23 NOTE — Progress Notes (Signed)
[place] [facility]  Clinical Social Work was referred by nurse for assessment of psychosocial needs.  Clinical Social Worker contacted patient by phone  to offer support and assess for needs.    Patient expressed concern regarding her husband who has dementia.  He has become totally dependent on others for his care.  Both of her sons help in the morning and in the evening with bathing and dressing.  Ciel has  been very resourceful and has gotten meals on wheels and additional aid services for him.  She admitted to being overwhelmed yesterday when was at [facility].  Keon had questions about Hospice Care.  CSW was able to locate hospices near her home in [place], [place], and she was going to contact them.  CSW provided contact information for any future questions or concerns.     Margaree Mackintosh, LCSW  Clinical Social Worker [facility]

## 2022-05-24 LAB — T4: T4, Total: 10.2 ug/dL (ref 4.5–12.0)

## 2022-05-26 ENCOUNTER — Other Ambulatory Visit: Payer: Self-pay

## 2022-06-05 ENCOUNTER — Other Ambulatory Visit: Payer: Self-pay

## 2022-06-05 ENCOUNTER — Inpatient Hospital Stay: Payer: Medicare Other

## 2022-06-05 ENCOUNTER — Inpatient Hospital Stay (HOSPITAL_BASED_OUTPATIENT_CLINIC_OR_DEPARTMENT_OTHER): Payer: Medicare Other | Admitting: Physician Assistant

## 2022-06-05 VITALS — BP 143/60 | HR 56 | Temp 98.0°F | Resp 18

## 2022-06-05 DIAGNOSIS — C642 Malignant neoplasm of left kidney, except renal pelvis: Secondary | ICD-10-CM

## 2022-06-05 DIAGNOSIS — C7951 Secondary malignant neoplasm of bone: Secondary | ICD-10-CM

## 2022-06-05 DIAGNOSIS — E1136 Type 2 diabetes mellitus with diabetic cataract: Secondary | ICD-10-CM | POA: Diagnosis not present

## 2022-06-05 DIAGNOSIS — Z87891 Personal history of nicotine dependence: Secondary | ICD-10-CM | POA: Diagnosis not present

## 2022-06-05 DIAGNOSIS — I2510 Atherosclerotic heart disease of native coronary artery without angina pectoris: Secondary | ICD-10-CM | POA: Diagnosis not present

## 2022-06-05 DIAGNOSIS — I252 Old myocardial infarction: Secondary | ICD-10-CM | POA: Diagnosis not present

## 2022-06-05 DIAGNOSIS — C7972 Secondary malignant neoplasm of left adrenal gland: Secondary | ICD-10-CM | POA: Diagnosis not present

## 2022-06-05 DIAGNOSIS — C7802 Secondary malignant neoplasm of left lung: Secondary | ICD-10-CM | POA: Diagnosis not present

## 2022-06-05 DIAGNOSIS — Z9221 Personal history of antineoplastic chemotherapy: Secondary | ICD-10-CM | POA: Diagnosis not present

## 2022-06-05 DIAGNOSIS — I129 Hypertensive chronic kidney disease with stage 1 through stage 4 chronic kidney disease, or unspecified chronic kidney disease: Secondary | ICD-10-CM | POA: Diagnosis not present

## 2022-06-05 DIAGNOSIS — N189 Chronic kidney disease, unspecified: Secondary | ICD-10-CM | POA: Diagnosis not present

## 2022-06-05 DIAGNOSIS — E039 Hypothyroidism, unspecified: Secondary | ICD-10-CM | POA: Diagnosis not present

## 2022-06-05 DIAGNOSIS — I700 Atherosclerosis of aorta: Secondary | ICD-10-CM | POA: Diagnosis not present

## 2022-06-05 DIAGNOSIS — C649 Malignant neoplasm of unspecified kidney, except renal pelvis: Secondary | ICD-10-CM | POA: Diagnosis not present

## 2022-06-05 DIAGNOSIS — E1122 Type 2 diabetes mellitus with diabetic chronic kidney disease: Secondary | ICD-10-CM | POA: Diagnosis not present

## 2022-06-05 DIAGNOSIS — E785 Hyperlipidemia, unspecified: Secondary | ICD-10-CM | POA: Diagnosis not present

## 2022-06-05 DIAGNOSIS — C7801 Secondary malignant neoplasm of right lung: Secondary | ICD-10-CM | POA: Diagnosis not present

## 2022-06-05 DIAGNOSIS — Z5112 Encounter for antineoplastic immunotherapy: Secondary | ICD-10-CM | POA: Diagnosis present

## 2022-06-05 DIAGNOSIS — N281 Cyst of kidney, acquired: Secondary | ICD-10-CM | POA: Diagnosis not present

## 2022-06-05 DIAGNOSIS — Z95828 Presence of other vascular implants and grafts: Secondary | ICD-10-CM

## 2022-06-05 DIAGNOSIS — C7971 Secondary malignant neoplasm of right adrenal gland: Secondary | ICD-10-CM | POA: Diagnosis not present

## 2022-06-05 DIAGNOSIS — K219 Gastro-esophageal reflux disease without esophagitis: Secondary | ICD-10-CM | POA: Diagnosis not present

## 2022-06-05 LAB — CBC WITH DIFFERENTIAL (CANCER CENTER ONLY)
Abs Immature Granulocytes: 0.05 10*3/uL (ref 0.00–0.07)
Basophils Absolute: 0.1 10*3/uL (ref 0.0–0.1)
Basophils Relative: 1 %
Eosinophils Absolute: 0.2 10*3/uL (ref 0.0–0.5)
Eosinophils Relative: 3 %
HCT: 39.6 % (ref 36.0–46.0)
Hemoglobin: 13.1 g/dL (ref 12.0–15.0)
Immature Granulocytes: 1 %
Lymphocytes Relative: 10 %
Lymphs Abs: 0.8 10*3/uL (ref 0.7–4.0)
MCH: 30.5 pg (ref 26.0–34.0)
MCHC: 33.1 g/dL (ref 30.0–36.0)
MCV: 92.1 fL (ref 80.0–100.0)
Monocytes Absolute: 0.7 10*3/uL (ref 0.1–1.0)
Monocytes Relative: 8 %
Neutro Abs: 6.2 10*3/uL (ref 1.7–7.7)
Neutrophils Relative %: 77 %
Platelet Count: 254 10*3/uL (ref 150–400)
RBC: 4.3 MIL/uL (ref 3.87–5.11)
RDW: 14.5 % (ref 11.5–15.5)
WBC Count: 8 10*3/uL (ref 4.0–10.5)
nRBC: 0 % (ref 0.0–0.2)

## 2022-06-05 LAB — CMP (CANCER CENTER ONLY)
ALT: 20 U/L (ref 0–44)
AST: 20 U/L (ref 15–41)
Albumin: 3.7 g/dL (ref 3.5–5.0)
Alkaline Phosphatase: 44 U/L (ref 38–126)
Anion gap: 6 (ref 5–15)
BUN: 32 mg/dL — ABNORMAL HIGH (ref 8–23)
CO2: 22 mmol/L (ref 22–32)
Calcium: 8.9 mg/dL (ref 8.9–10.3)
Chloride: 111 mmol/L (ref 98–111)
Creatinine: 1.58 mg/dL — ABNORMAL HIGH (ref 0.44–1.00)
GFR, Estimated: 33 mL/min — ABNORMAL LOW (ref >60)
Glucose, Bld: 180 mg/dL — ABNORMAL HIGH (ref 70–99)
Potassium: 3.6 mmol/L (ref 3.5–5.1)
Sodium: 139 mmol/L (ref 135–145)
Total Bilirubin: 0.6 mg/dL (ref 0.3–1.2)
Total Protein: 6 g/dL — ABNORMAL LOW (ref 6.5–8.1)

## 2022-06-05 LAB — TSH: TSH: 1.595 u[IU]/mL (ref 0.350–4.500)

## 2022-06-05 MED ORDER — SODIUM CHLORIDE 0.9 % IV SOLN: Freq: Once | INTRAVENOUS | Status: AC

## 2022-06-05 MED ORDER — HEPARIN SOD (PORK) LOCK FLUSH 100 UNIT/ML IV SOLN
500.0000 [IU] | Freq: Once | INTRAVENOUS | Status: AC | PRN
  Administered 2022-06-05: 500 [IU]

## 2022-06-05 MED ORDER — SODIUM CHLORIDE 0.9 % IV SOLN
240.0000 mg | Freq: Once | INTRAVENOUS | Status: AC
  Administered 2022-06-05: 240 mg via INTRAVENOUS
  Filled 2022-06-05: qty 24

## 2022-06-05 MED ORDER — SODIUM CHLORIDE 0.9% FLUSH
10.0000 mL | INTRAVENOUS | Status: DC | PRN
  Administered 2022-06-05: 10 mL

## 2022-06-05 MED ORDER — SODIUM CHLORIDE 0.9 % IV SOLN: INTRAVENOUS | Status: AC

## 2022-06-05 MED ORDER — SODIUM CHLORIDE 0.9% FLUSH
10.0000 mL | Freq: Once | INTRAVENOUS | Status: AC
  Administered 2022-06-05: 10 mL

## 2022-06-05 NOTE — Progress Notes (Signed)
Per Dede Query PA-C -it is ok to treat today with Scr of 1.58. Ok to run fluids with Chemotherapy.

## 2022-06-05 NOTE — Patient Instructions (Signed)
[facility]  Discharge Instructions: Thank you for choosing [facility] to provide your oncology and hematology care.   If you have a lab appointment with the Cancer Center, please go directly to the Cancer Center and check in at the registration area.   Wear comfortable clothing and clothing appropriate for easy access to any Portacath or PICC line.   We strive to give you quality time with your provider. You may need to reschedule your appointment if you arrive late (15 or more minutes).  Arriving late affects you and other patients whose appointments are after yours.  Also, if you miss three or more appointments without notifying the office, you may be dismissed from the clinic at the provider's discretion.      For prescription refill requests, have your pharmacy contact our office and allow 72 hours for refills to be completed.    Today you received the following chemotherapy and/or immunotherapy agents: Opdivo      To help prevent nausea and vomiting after your treatment, we encourage you to take your nausea medication as directed.  BELOW ARE SYMPTOMS THAT SHOULD BE REPORTED IMMEDIATELY: *FEVER GREATER THAN 100.4 F (38 C) OR HIGHER *CHILLS OR SWEATING *NAUSEA AND VOMITING THAT IS NOT CONTROLLED WITH YOUR NAUSEA MEDICATION *UNUSUAL SHORTNESS OF BREATH *UNUSUAL BRUISING OR BLEEDING *URINARY PROBLEMS (pain or burning when urinating, or frequent urination) *BOWEL PROBLEMS (unusual diarrhea, constipation, pain near the anus) TENDERNESS IN MOUTH AND THROAT WITH OR WITHOUT PRESENCE OF ULCERS (sore throat, sores in mouth, or a toothache) UNUSUAL RASH, SWELLING OR PAIN  UNUSUAL VAGINAL DISCHARGE OR ITCHING   Items with * indicate a potential emergency and should be followed up as soon as possible or go to the Emergency Department if any problems should occur.  Please show the CHEMOTHERAPY ALERT CARD or IMMUNOTHERAPY ALERT CARD at check-in  to the Emergency Department and triage nurse.  Should you have questions after your visit or need to cancel or reschedule your appointment, please contact [facility]  Dept: [phone]  and follow the prompts.  Office hours are 8:00 a.m. to 4:30 p.m. Monday - Friday. Please note that voicemails left after 4:00 p.m. may not be returned until the following business day.  We are closed weekends and major holidays. You have access to a nurse at all times for urgent questions. Please call the main number to the clinic Dept: [phone] and follow the prompts.   For any non-urgent questions, you may also contact your provider using MyChart. We now offer e-Visits for anyone 18 and older to request care online for non-urgent symptoms. For details visit [email or web address].   Also download the MyChart app! Go to the app store, search "MyChart", open the app, select [place], and log in with your MyChart username and password.   

## 2022-06-06 ENCOUNTER — Encounter: Payer: Self-pay | Admitting: Hematology

## 2022-06-06 NOTE — Progress Notes (Signed)
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  SUMMARY OF ONCOLOGIC HISTORY: Oncology History  Cancer, 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[place])  01/10/2017 Initial Diagnosis   Cancer, 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[place])   01/02/2018 - 01/16/2022 Chemotherapy   Patient is on Treatment Plan : RENAL CELL CARCINOMA NIVOLUMAB Q14D     01/22/2022 Cancer Staging   Staging form: Bone - Appendicular Skeleton, Trunk, Skull, and Facial Bones, AJCC 8th Edition - Clinical: Stage IVB (pM1b) - Signed by Brunetta Genera, MD on 01/22/2022   02/01/2022 -  Chemotherapy   Patient is on Treatment Plan : RENAL CELL CARCINOMA Nivolumab (240) q14d/     Renal cell carcinoma, left (HCC)  04/11/2017 Initial Diagnosis   Renal cell carcinoma ([place])   01/02/2018 - 01/16/2022 Chemotherapy   The patient had nivolumab (OPDIVO) 240 mg in sodium chloride 0.9 % 100 mL chemo infusion, 240 mg, Intravenous, Once, 47 of 50 cycles Administration: 240 mg (01/02/2018), 240 mg (01/15/2018), 240 mg (01/30/2018), 240 mg (02/13/2018), 240 mg (02/27/2018), 240 mg (03/13/2018), 240 mg (03/27/2018), 240 mg (04/10/2018), 240 mg (04/24/2018), 240 mg (05/08/2018), 240 mg (05/22/2018), 240 mg (06/05/2018), 240 mg (06/19/2018), 240 mg (08/28/2018), 240 mg (09/11/2018), 240 mg (10/09/2018), 240 mg (10/22/2018), 240 mg (11/06/2018), 240 mg (11/20/2018), 240 mg (12/04/2018), 240 mg (12/18/2018), 240 mg (01/01/2019), 240 mg (01/15/2019), 240 mg (01/29/2019), 240 mg (02/12/2019), 240 mg (02/26/2019), 240 mg (03/12/2019), 240 mg (03/26/2019), 240 mg (04/09/2019), 240 mg (04/28/2019), 240 mg (05/12/2019), 240 mg (05/27/2019), 240 mg (06/09/2019), 240 mg (06/23/2019), 240 mg (07/07/2019), 240 mg (07/21/2019), 240 mg (08/04/2019), 240 mg (08/18/2019), 240 mg (09/03/2019), 240 mg (09/15/2019), 240 mg (09/29/2019), 240 mg (10/12/2019), 240 mg (10/27/2019), 240 mg  (11/10/2019), 240 mg (12/08/2019), 240 mg (12/22/2019), 240 mg (01/19/2020)  for chemotherapy treatment.    02/01/2022 -  Chemotherapy   Patient is on Treatment Plan : RENAL CELL CARCINOMA Nivolumab (240) q14d/       CURRENT THERAPY: metastatic renal cell carcinoma, [place] See and Nivolumab  INTERVAL HISTORY:  Adhithi Hospodar Gagliardo 82 y.o. female is here for continued evaluation and management of metastatic renal cell carcinoma. She is here to start cycle 10 of Nivolumab.  Ms. Stanco is physically feeling well but shares that her husband was put on hospice care this week. She is having a hard time with his transition and could use some support at home. She is tolerating treatment without any new side effects. Her appetite is stable and denies any changes to her weight. She denies nausea, vomiting or abdominal pain. Her bowel habits are unchanged without recurrent episodes of diarrhea or constipation. She denies easy bruising or signs of active bleeding. She report her wound on her right toe has improved and it is managed by her PCP. She denies fevers, chills, sweats, shortness of breath, chest pain or cough. She has no other complaints.    Patient Active Problem List   Diagnosis Date Noted   Atrophy of thyroid (acquired) 01/15/2022   At risk for falls 01/15/2022   CKD (chronic kidney disease) 01/15/2022   Diabetic ulcer of foot with fat layer exposed ([place]) 01/15/2022   Dizziness 01/15/2022   Dysuria 01/15/2022   Knee osteoarthritis 01/15/2022   Pulmonary nodule 01/15/2022   Type 2 diabetes mellitus ([place]) 08/15/2021   Incisional hernia 06/23/2020   Gastritis and gastroduodenitis 02/26/2020  Rectal discomfort 02/26/2020   History of colonic polyps 02/26/2020   Ventral hernia 11/10/2019   History of cholecystectomy 07/17/2019   Degeneration of lumbar intervertebral disc 11/12/2018   Anemia 10/10/2018   Chronic cholecystitis 09/04/2018   Acute cholecystitis 08/29/2018   Biliary stricture 08/23/2018    Medicare annual wellness visit, subsequent 08/14/2018   CAD (coronary artery disease) 08/14/2018   Dyslipidemia 08/14/2018   Abnormal LFTs 08/05/2018   Choledocholithiasis 08/05/2018   Dilated bile duct 08/05/2018   History of ERCP 08/05/2018   History of renal cell carcinoma 08/05/2018   Port-A-Cath in place 01/30/2018   Counseling regarding advance care planning and goals of care 01/04/2018   Renal cell carcinoma, left ([place]) 04/11/2017   Cancer, 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[place]) 01/10/2017   Left renal mass 08/01/2016   Hyperlipidemia 05/23/2015   Essential hypertension 05/23/2015   Hypothyroidism 05/23/2015    is allergic to doxycycline, adhesive [tape], duloxetine, gabapentin (once-daily), nsaids, and statins.  MEDICAL HISTORY: Past Medical History:  Diagnosis Date   Anemia    Arthritis    lower back, hips, hands   Biliary stricture    Diabetes mellitus ([place])    type 2    Early cataracts, bilateral    Md just watching   Elevated liver enzymes    Family history of adverse reaction to anesthesia    Daughter hard to wake up   Gallstones    GERD (gastroesophageal reflux disease)    occasional - diet controlled   History of blood transfusion 2018   History of hiatal hernia    HTN (hypertension)    Hyperlipidemia    Hypothyroidism    left renal ca dx'd 2018   renal cancer - left kidney removed, pill chemo x 1 yr   Myocardial infarction ([place]) 1991   no deficits   SVD (spontaneous vaginal delivery)    x 3   Wears glasses     SURGICAL HISTORY: Past Surgical History:  Procedure Laterality Date   BALLOON DILATION N/A 07/23/2018   Procedure: BALLOON DILATION;  Surgeon: Mansouraty, Telford Nab., MD;  Location: [place];  Service: Gastroenterology;  Laterality: N/A;   BILIARY BRUSHING  08/10/2018   Procedure: BILIARY BRUSHING;  Surgeon: Rush Landmark Telford Nab., MD;  Location: [place];  Service: Gastroenterology;;   BILIARY BRUSHING  11/30/2018   Procedure: BILIARY  BRUSHING;  Surgeon: Irving Copas., MD;  Location: [place];  Service: Gastroenterology;;   BILIARY DILATION  08/10/2018   Procedure: BILIARY DILATION;  Surgeon: Irving Copas., MD;  Location: [place];  Service: Gastroenterology;;   BILIARY DILATION  08/27/2018   Procedure: BILIARY DILATION;  Surgeon: Irving Copas., MD;  Location: [place];  Service: Gastroenterology;;   BILIARY DILATION  01/24/2020   Procedure: BILIARY DILATION;  Surgeon: Irving Copas., MD;  Location: [place];  Service: Gastroenterology;;   BILIARY STENT PLACEMENT  08/10/2018   Procedure: BILIARY STENT PLACEMENT;  Surgeon: Irving Copas., MD;  Location: [place];  Service: Gastroenterology;;   BILIARY STENT PLACEMENT  08/27/2018   Procedure: BILIARY STENT PLACEMENT;  Surgeon: Irving Copas., MD;  Location: [place];  Service: Gastroenterology;;   BILIARY STENT PLACEMENT  11/30/2018   Procedure: BILIARY STENT PLACEMENT;  Surgeon: Irving Copas., MD;  Location: [place];  Service: Gastroenterology;;   BIOPSY  07/23/2018   Procedure: BIOPSY;  Surgeon: Irving Copas., MD;  Location: [place];  Service: Gastroenterology;;   BIOPSY  01/24/2020   Procedure: BIOPSY;  Surgeon: Rush Landmark,  Telford Nab., MD;  Location: [facility];  Service: Gastroenterology;;   CHOLECYSTECTOMY N/A 09/02/2018   Procedure: LAPAROSCOPIC CHOLECYSTECTOMY;  Surgeon: Stark Klein, MD;  Location: [place];  Service: General;  Laterality: N/A;   COLONOSCOPY     normal    ENDOSCOPIC RETROGRADE CHOLANGIOPANCREATOGRAPHY (ERCP) WITH PROPOFOL N/A 08/10/2018   Procedure: ENDOSCOPIC RETROGRADE CHOLANGIOPANCREATOGRAPHY (ERCP) WITH PROPOFOL;  Surgeon: Irving Copas., MD;  Location: [place];  Service: Gastroenterology;  Laterality: N/A;   ENDOSCOPIC RETROGRADE CHOLANGIOPANCREATOGRAPHY (ERCP) WITH PROPOFOL N/A 11/30/2018   Procedure: ENDOSCOPIC RETROGRADE  CHOLANGIOPANCREATOGRAPHY (ERCP) WITH PROPOFOL;  Surgeon: Rush Landmark Telford Nab., MD;  Location: [place];  Service: Gastroenterology;  Laterality: N/A;   ENDOSCOPIC RETROGRADE CHOLANGIOPANCREATOGRAPHY (ERCP) WITH PROPOFOL N/A 01/24/2020   Procedure: ENDOSCOPIC RETROGRADE CHOLANGIOPANCREATOGRAPHY (ERCP) WITH PROPOFOL;  Surgeon: Rush Landmark Telford Nab., MD;  Location: [place];  Service: Gastroenterology;  Laterality: N/A;   ERCP N/A 07/23/2018   Procedure: ENDOSCOPIC RETROGRADE CHOLANGIOPANCREATOGRAPHY (ERCP);  Surgeon: Irving Copas., MD;  Location: [place];  Service: Gastroenterology;  Laterality: N/A;   ERCP N/A 08/27/2018   Procedure: ENDOSCOPIC RETROGRADE CHOLANGIOPANCREATOGRAPHY (ERCP);  Surgeon: Irving Copas., MD;  Location: [place];  Service: Gastroenterology;  Laterality: N/A;   ESOPHAGOGASTRODUODENOSCOPY N/A 01/24/2020   Procedure: ESOPHAGOGASTRODUODENOSCOPY (EGD);  Surgeon: Irving Copas., MD;  Location: [place];  Service: Gastroenterology;  Laterality: N/A;   ESOPHAGOGASTRODUODENOSCOPY (EGD) WITH PROPOFOL N/A 08/27/2018   Procedure: ESOPHAGOGASTRODUODENOSCOPY (EGD) WITH PROPOFOL;  Surgeon: Rush Landmark Telford Nab., MD;  Location: [place];  Service: Gastroenterology;  Laterality: N/A;   EUS N/A 08/27/2018   Procedure: ESOPHAGEAL ENDOSCOPIC ULTRASOUND (EUS) RADIAL;  Surgeon: Rush Landmark Telford Nab., MD;  Location: [place];  Service: Gastroenterology;  Laterality: N/A;   FINE NEEDLE ASPIRATION  08/27/2018   Procedure: FINE NEEDLE ASPIRATION (FNA) LINEAR;  Surgeon: Irving Copas., MD;  Location: [place];  Service: Gastroenterology;;   Fatima Blank HERNIA REPAIR N/A 06/23/2020   Procedure: [place];  Surgeon: Felicie Morn, MD;  Location: [facility];  Service: General;  Laterality: N/A;  ROOM 2 STARTING AT 11:00AM FOR 90 MIN   IR IMAGING GUIDED PORT INSERTION  01/08/2018   LAPAROSCOPIC NEPHRECTOMY Left  08/01/2016   Procedure: LAPAROSCOPIC  RADICAL NEPHRECTOMY/ REPAIR OF UMBILICAL HERNIA;  Surgeon: Raynelle Bring, MD;  Location: [facility];  Service: Urology;  Laterality: Left;   REMOVAL OF STONES  07/23/2018   Procedure: REMOVAL OF GALL STONES;  Surgeon: Rush Landmark Telford Nab., MD;  Location: [place];  Service: Gastroenterology;;   REMOVAL OF STONES  08/10/2018   Procedure: REMOVAL OF STONES;  Surgeon: Irving Copas., MD;  Location: [place];  Service: Gastroenterology;;   REMOVAL OF STONES  08/27/2018   Procedure: REMOVAL OF STONES;  Surgeon: Irving Copas., MD;  Location: [place];  Service: Gastroenterology;;   REMOVAL OF STONES  01/24/2020   Procedure: REMOVAL OF STONES;  Surgeon: Irving Copas., MD;  Location: [place];  Service: Gastroenterology;;   [name]  07/23/2018   Procedure: [name];  Surgeon: Irving Copas., MD;  Location: [place];  Service: Gastroenterology;;   Lavell Islam REMOVAL  08/27/2018   Procedure: STENT REMOVAL;  Surgeon: Irving Copas., MD;  Location: [place];  Service: Gastroenterology;;   Lavell Islam REMOVAL  11/30/2018   Procedure: STENT REMOVAL;  Surgeon: Irving Copas., MD;  Location: [place];  Service: Gastroenterology;;   Lavell Islam REMOVAL  01/24/2020   Procedure: STENT REMOVAL;  Surgeon: Irving Copas., MD;  Location: [place];  Service: Gastroenterology;;   UPPER GI ENDOSCOPY     x 1   VAGINAL PROLAPSE REPAIR  11/18/2019   [facility]    SOCIAL HISTORY: Social History   Socioeconomic History   Marital status: Married    Spouse name: Not on file   Number of children: 3   Years of education: Not on file   Highest education level: Not on file  Occupational History   Occupation: retired  Tobacco Use   Smoking status: Former    Packs/day: 1.00    Years: 8.00    Total pack years: 8.00    Types: Cigarettes    Quit date: 06/18/1964    Years since quitting: 58.0    Smokeless tobacco: Never  Vaping Use   Vaping Use: Never used  Substance and Sexual Activity   Alcohol use: No   Drug use: No   Sexual activity: Not Currently    Birth control/protection: Post-menopausal  Other Topics Concern   Not on file  Social History Narrative   Married.  Three children   Social Determinants of Health   Financial Resource Strain: Low Risk  (05/23/2022)   Overall Financial Resource Strain (CARDIA)    Difficulty of Paying Living Expenses: Not hard at all  Food Insecurity: No Food Insecurity (05/23/2022)   Hunger Vital Sign    Worried About Running Out of Food in the Last Year: Never true    Ran Out of Food in the Last Year: Never true  Transportation Needs: No Transportation Needs (05/23/2022)   PRAPARE - Hydrologist (Medical): No    Lack of Transportation (Non-Medical): No  Physical Activity: Not on file  Stress: Not on file  Social Connections: Not on file  Intimate Partner Violence: Not At Risk (05/23/2022)   Humiliation, Afraid, Rape, and Kick questionnaire    Fear of Current or Ex-Partner: No    Emotionally Abused: No    Physically Abused: No    Sexually Abused: No    FAMILY HISTORY: Family History  Problem Relation Age of Onset   Heart attack Father 71   Heart disease Brother 36       CABG   Colon cancer Neg Hx    Esophageal cancer Neg Hx    Inflammatory bowel disease Neg Hx    Liver disease Neg Hx    Pancreatic cancer Neg Hx    Rectal cancer Neg Hx    Stomach cancer Neg Hx      ROS 10 Point review of Systems was done is negative except as noted above.  PHYSICAL EXAMINATION Vital signs reviewed Vitals:   06/05/22 1017  BP: (!) 149/64  Pulse: 69  Resp: 18  Temp: (!) 97.2 F (36.2 C)  SpO2: 100%    NAD GENERAL:alert, in no acute distress and comfortable SKIN: no acute rashes, no significant lesions EYES: conjunctiva are pink and non-injected, sclera anicteric LUNGS: clear to auscultation b/l with  normal respiratory effort HEART: regular rate & rhythm Extremity: no pedal edema PSYCH: alert & oriented x 3 with fluent speech NEURO: no focal motor/sensory deficits  Exam performed in chair.  LABORATORY DATA: .    Latest Ref Rng & Units 06/05/2022    9:43 AM 05/22/2022    8:55 AM 05/08/2022   10:33 AM  CBC  WBC 4.0 - 10.5 K/uL 8.0  8.2  10.5   Hemoglobin 12.0 - 15.0 g/dL 13.1  13.5  14.1   Hematocrit 36.0 - 46.0 % 39.6  40.1  42.9   Platelets 150 - 400 K/uL 254  266  246    .    Latest Ref Rng & Units 06/05/2022    9:43 AM 05/22/2022    8:55 AM 05/08/2022   10:33 AM  CMP  Glucose 70 - 99 mg/dL 180  147  153   BUN 8 - 23 mg/dL 32  31  32   Creatinine 0.44 - 1.00 mg/dL 1.58  1.78  1.56   Sodium 135 - 145 mmol/L 139  141  141   Potassium 3.5 - 5.1 mmol/L 3.6  3.9  4.5   Chloride 98 - 111 mmol/L 111  113  114   CO2 22 - 32 mmol/L 22  20  21   $ Calcium 8.9 - 10.3 mg/dL 8.9  9.5  9.1   Total Protein 6.5 - 8.1 g/dL 6.0  6.3  6.1   Total Bilirubin 0.3 - 1.2 mg/dL 0.6  0.6  0.5   Alkaline Phos 38 - 126 U/L 44  37  50   AST 15 - 41 U/L 20  18  19   $ ALT 0 - 44 U/L 20  18  35    RADIOLOGY    CLINICAL DATA:  Subsequent treatment strategy for metastatic renal cell carcinoma.   EXAM: NUCLEAR MEDICINE PET SKULL BASE TO THIGH   TECHNIQUE: 6.32 mCi F-18 FDG was injected intravenously. Full-ring PET imaging was performed from the skull base to thigh after the radiotracer. CT data was obtained and used for attenuation correction and anatomic localization.   Fasting blood glucose: 159 mg/dl   COMPARISON:  PET-CT 08/29/2020 and 02/09/2020.   FINDINGS: Mediastinal blood pool activity: SUV max 2.7   NECK:   No hypermetabolic cervical lymph nodes are identified.There are no lesions of the pharyngeal mucosal space. Activity associated with the muscles of phonation is within physiologic limits and similar to previous study.   Incidental CT findings: Bilateral carotid  atherosclerosis.   CHEST:   There are no hypermetabolic mediastinal, hilar or axillary lymph nodes. No hypermetabolic pulmonary activity or suspicious nodularity.   Incidental CT findings: Right IJ Port-A-Cath extends to the superior cavoatrial junction. There is diffuse atherosclerosis of the aorta, great vessels and coronary arteries. Mild linear scarring at both lung bases appears unchanged.   ABDOMEN/PELVIS:   There is no hypermetabolic activity within the liver, adrenal glands, spleen or pancreas. There is no hypermetabolic nodal activity. Stable appearance of the right kidney. No hypermetabolic activity within the left nephrectomy bed   Incidental CT findings: Diffuse aortic and branch vessel atherosclerosis, scattered colonic diverticulosis and pelvic floor laxity are again noted.   SKELETON:   There is no hypermetabolic activity to suggest osseous metastatic disease.   Incidental CT findings: Grossly stable sclerotic lesions within the thoracic spine, without hypermetabolic activity. Lower lumbar spondylosis and sacroiliac degenerative changes bilaterally.   IMPRESSION: 1. Stable PET-CT status post left nephrectomy. No evidence of local recurrence or hypermetabolic metastatic disease. 2. Stable incidental findings including coronary and Aortic Atherosclerosis (ICD10-I70.0). Pelvic floor laxity.     Electronically Signed   By: Richardean Sale M.D.   On: 04/03/2021 12:30      ASSESSMENT and PLAN:  Audrey [name] is a 82 y.o. female who returns for a follow up for metastatic renal cell carcinoma.   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 -Rt adrenal gland bx - showed  clear cell RCC -S/p Cytoreductive left radical nephrectomy and left adrenal gland resection on 08/01/2016 by Dr Alinda Money. -Started systemic therapy with Nivolumab 240 mg on 01/02/2018 and Xgeva on  02/27/2018.  -Most recent PET scan from 11/09/2021 showed no clear evidence of new or progressive disease.   # Hypothyroidism/Adrenal insufficiency/Diabetes -Continue being followed by Dr. Buddy Duty   #H/o Choledocholithiasis and cholelithiasis -07/10/18 US Abdomen revealed Biliary duct dilatation with 8 mm calculus at the level of the ampulla in the distal common bile duct. 2. Cholelithiasis. No gallbladder wall thickening or pericholecystic fluid. 3. Appearance of the liver raises concern for underlying hepatic cirrhosis. No focal liver lesions are demonstrable. 4.  Left kidney absent. 5.  Small cysts in right kidney. 6.  Aortic Atherosclerosis. -S/p ERCP on 07/23/18 with Dr. Valarie Merino Mansouraty -09/02/18 Cytopathology from ERCP was suspicious for malignant cells in bile duct, but was not definitive, other two findings were benign.  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 CBC unremarkable. CMP shows creatinine levels back to baseline, 1.58. -No prohibitive toxicities with treatment so recommend to continue with Nivolumab as scheduled. -Last dose of Xgeva was given on 1/31//2023, due at next visit.  -We will order PET scan to assess treatment response before next visit.  -RTC in 2 weeks for labs and follow up visit with Dr. Melquiades Kovar [place] before next [facility] treatment.   FOLLOW UP: As per integrated scheduling   All of the patient's questions were answered with apparent satisfaction. The patient knows to call the clinic with any problems, questions or concerns.  I have spent a total of 30 minutes minutes of face-to-face and non-face-to-face time, preparing to see the patient, performing a medically appropriate examination, counseling and educating the patient, ordering medications, referring and communicating with other health care professionals, documenting clinical  information in the electronic health record,  and care coordination.   Dede Query PA-C Dept of Hematology and [place] at [facility] Phone: [phone]

## 2022-06-07 LAB — T4: T4, Total: 7.9 ug/dL (ref 4.5–12.0)

## 2022-06-10 DIAGNOSIS — H903 Sensorineural hearing loss, bilateral: Secondary | ICD-10-CM | POA: Insufficient documentation

## 2022-06-19 ENCOUNTER — Inpatient Hospital Stay: Payer: Medicare Other

## 2022-06-19 VITALS — BP 140/68 | HR 60 | Temp 97.9°F | Resp 18

## 2022-06-19 DIAGNOSIS — C7951 Secondary malignant neoplasm of bone: Secondary | ICD-10-CM

## 2022-06-19 DIAGNOSIS — C642 Malignant neoplasm of left kidney, except renal pelvis: Secondary | ICD-10-CM

## 2022-06-19 DIAGNOSIS — M79671 Pain in right foot: Secondary | ICD-10-CM

## 2022-06-19 DIAGNOSIS — Z5112 Encounter for antineoplastic immunotherapy: Secondary | ICD-10-CM | POA: Diagnosis not present

## 2022-06-19 DIAGNOSIS — Z95828 Presence of other vascular implants and grafts: Secondary | ICD-10-CM

## 2022-06-19 LAB — CBC WITH DIFFERENTIAL (CANCER CENTER ONLY)
Abs Immature Granulocytes: 0.09 10*3/uL — ABNORMAL HIGH (ref 0.00–0.07)
Basophils Absolute: 0.1 10*3/uL (ref 0.0–0.1)
Basophils Relative: 1 %
Eosinophils Absolute: 0.5 10*3/uL (ref 0.0–0.5)
Eosinophils Relative: 4 %
HCT: 41.4 % (ref 36.0–46.0)
Hemoglobin: 13.7 g/dL (ref 12.0–15.0)
Immature Granulocytes: 1 %
Lymphocytes Relative: 9 %
Lymphs Abs: 1 10*3/uL (ref 0.7–4.0)
MCH: 30.6 pg (ref 26.0–34.0)
MCHC: 33.1 g/dL (ref 30.0–36.0)
MCV: 92.6 fL (ref 80.0–100.0)
Monocytes Absolute: 1 10*3/uL (ref 0.1–1.0)
Monocytes Relative: 9 %
Neutro Abs: 8.1 10*3/uL — ABNORMAL HIGH (ref 1.7–7.7)
Neutrophils Relative %: 76 %
Platelet Count: 270 10*3/uL (ref 150–400)
RBC: 4.47 MIL/uL (ref 3.87–5.11)
RDW: 13.9 % (ref 11.5–15.5)
WBC Count: 10.7 10*3/uL — ABNORMAL HIGH (ref 4.0–10.5)
nRBC: 0 % (ref 0.0–0.2)

## 2022-06-19 LAB — CMP (CANCER CENTER ONLY)
ALT: 38 U/L (ref 0–44)
AST: 38 U/L (ref 15–41)
Albumin: 3.8 g/dL (ref 3.5–5.0)
Alkaline Phosphatase: 50 U/L (ref 38–126)
Anion gap: 7 (ref 5–15)
BUN: 36 mg/dL — ABNORMAL HIGH (ref 8–23)
CO2: 21 mmol/L — ABNORMAL LOW (ref 22–32)
Calcium: 8.9 mg/dL (ref 8.9–10.3)
Chloride: 111 mmol/L (ref 98–111)
Creatinine: 1.53 mg/dL — ABNORMAL HIGH (ref 0.44–1.00)
GFR, Estimated: 34 mL/min — ABNORMAL LOW (ref >60)
Glucose, Bld: 145 mg/dL — ABNORMAL HIGH (ref 70–99)
Potassium: 4 mmol/L (ref 3.5–5.1)
Sodium: 139 mmol/L (ref 135–145)
Total Bilirubin: 0.6 mg/dL (ref 0.3–1.2)
Total Protein: 5.8 g/dL — ABNORMAL LOW (ref 6.5–8.1)

## 2022-06-19 LAB — TSH: TSH: 1.275 u[IU]/mL (ref 0.350–4.500)

## 2022-06-19 MED ORDER — HEPARIN SOD (PORK) LOCK FLUSH 100 UNIT/ML IV SOLN: 500.0000 [IU] | Freq: Once | INTRAVENOUS | Status: DC | PRN

## 2022-06-19 MED ORDER — ACETAMINOPHEN 325 MG PO TABS
650.0000 mg | ORAL_TABLET | Freq: Four times a day (QID) | ORAL | Status: AC | PRN
  Administered 2022-06-19: 650 mg via ORAL
  Filled 2022-06-19: qty 2

## 2022-06-19 MED ORDER — SODIUM CHLORIDE 0.9% FLUSH
10.0000 mL | INTRAVENOUS | Status: DC | PRN
  Administered 2022-06-19: 10 mL

## 2022-06-19 MED ORDER — SODIUM CHLORIDE 0.9% FLUSH
10.0000 mL | Freq: Once | INTRAVENOUS | Status: AC
  Administered 2022-06-19: 10 mL

## 2022-06-19 MED ORDER — SODIUM CHLORIDE 0.9 % IV SOLN: Freq: Once | INTRAVENOUS | Status: DC

## 2022-06-19 MED ORDER — SODIUM CHLORIDE 0.9 % IV SOLN
240.0000 mg | Freq: Once | INTRAVENOUS | Status: AC
  Administered 2022-06-19: 240 mg via INTRAVENOUS
  Filled 2022-06-19: qty 24

## 2022-06-19 MED ORDER — SODIUM CHLORIDE 0.9 % IV SOLN: Freq: Once | INTRAVENOUS | Status: AC

## 2022-06-19 NOTE — Patient Instructions (Signed)
[facility]  Discharge Instructions: Thank you for choosing [facility] to provide your oncology and hematology care.   If you have a lab appointment with the Cancer Center, please go directly to the Cancer Center and check in at the registration area.   Wear comfortable clothing and clothing appropriate for easy access to any Portacath or PICC line.   We strive to give you quality time with your provider. You may need to reschedule your appointment if you arrive late (15 or more minutes).  Arriving late affects you and other patients whose appointments are after yours.  Also, if you miss three or more appointments without notifying the office, you may be dismissed from the clinic at the provider's discretion.      For prescription refill requests, have your pharmacy contact our office and allow 72 hours for refills to be completed.    Today you received the following chemotherapy and/or immunotherapy agents: Opdivo      To help prevent nausea and vomiting after your treatment, we encourage you to take your nausea medication as directed.  BELOW ARE SYMPTOMS THAT SHOULD BE REPORTED IMMEDIATELY: *FEVER GREATER THAN 100.4 F (38 C) OR HIGHER *CHILLS OR SWEATING *NAUSEA AND VOMITING THAT IS NOT CONTROLLED WITH YOUR NAUSEA MEDICATION *UNUSUAL SHORTNESS OF BREATH *UNUSUAL BRUISING OR BLEEDING *URINARY PROBLEMS (pain or burning when urinating, or frequent urination) *BOWEL PROBLEMS (unusual diarrhea, constipation, pain near the anus) TENDERNESS IN MOUTH AND THROAT WITH OR WITHOUT PRESENCE OF ULCERS (sore throat, sores in mouth, or a toothache) UNUSUAL RASH, SWELLING OR PAIN  UNUSUAL VAGINAL DISCHARGE OR ITCHING   Items with * indicate a potential emergency and should be followed up as soon as possible or go to the Emergency Department if any problems should occur.  Please show the CHEMOTHERAPY ALERT CARD or IMMUNOTHERAPY ALERT CARD at check-in  to the Emergency Department and triage nurse.  Should you have questions after your visit or need to cancel or reschedule your appointment, please contact [facility]  Dept: [phone]  and follow the prompts.  Office hours are 8:00 a.m. to 4:30 p.m. Monday - Friday. Please note that voicemails left after 4:00 p.m. may not be returned until the following business day.  We are closed weekends and major holidays. You have access to a nurse at all times for urgent questions. Please call the main number to the clinic Dept: [phone] and follow the prompts.   For any non-urgent questions, you may also contact your provider using MyChart. We now offer e-Visits for anyone 18 and older to request care online for non-urgent symptoms. For details visit [email or web address].   Also download the MyChart app! Go to the app store, search "MyChart", open the app, select [facility], and log in with your MyChart username and password.   

## 2022-06-19 NOTE — Progress Notes (Signed)
Pt declined to finish fluids and requested to be d/c. Pt tolerated tx well and was deaccessed and ambulated to lobby without c/o.

## 2022-06-19 NOTE — Progress Notes (Signed)
Pt presented with c/o right foot pain 8/10 and worsening. This Pt stated "Can I have a tylenol?". This RN repositioned Pt's foot and elevated it with a pillow. This RN made [name] aware of Pt's requested. This RN received V/O for 1 dose of tylenol PO 650 mg to be given during infusion appt for Pt's right foot pain.

## 2022-06-20 ENCOUNTER — Other Ambulatory Visit (HOSPITAL_COMMUNITY): Payer: Medicare Other

## 2022-06-20 ENCOUNTER — Other Ambulatory Visit: Payer: Self-pay | Admitting: Hematology

## 2022-06-21 LAB — T4: T4, Total: 9.5 ug/dL (ref 4.5–12.0)

## 2022-06-28 ENCOUNTER — Ambulatory Visit (HOSPITAL_COMMUNITY)
Admission: RE | Admit: 2022-06-28 | Discharge: 2022-06-28 | Disposition: A | Discharge status: Home / Self Care | Payer: Medicare Other | Source: Ambulatory Visit | Attending: Physician Assistant | Admitting: Physician Assistant

## 2022-06-28 DIAGNOSIS — C642 Malignant neoplasm of left kidney, except renal pelvis: Secondary | ICD-10-CM | POA: Diagnosis present

## 2022-06-28 DIAGNOSIS — R911 Solitary pulmonary nodule: Secondary | ICD-10-CM | POA: Insufficient documentation

## 2022-06-28 DIAGNOSIS — C7951 Secondary malignant neoplasm of bone: Secondary | ICD-10-CM | POA: Insufficient documentation

## 2022-06-28 LAB — GLUCOSE, CAPILLARY: Glucose-Capillary: 135 mg/dL — ABNORMAL HIGH (ref 70–99)

## 2022-06-28 MED ORDER — FLUDEOXYGLUCOSE F - 18 (FDG) INJECTION
6.2000 | Freq: Once | INTRAVENOUS | Status: AC
  Administered 2022-06-28: 6.15 via INTRAVENOUS

## 2022-07-02 ENCOUNTER — Other Ambulatory Visit: Payer: Self-pay

## 2022-07-02 DIAGNOSIS — C642 Malignant neoplasm of left kidney, except renal pelvis: Secondary | ICD-10-CM

## 2022-07-03 ENCOUNTER — Encounter: Payer: Self-pay | Admitting: Hematology

## 2022-07-03 ENCOUNTER — Inpatient Hospital Stay: Payer: Medicare Other

## 2022-07-03 ENCOUNTER — Inpatient Hospital Stay: Payer: Medicare Other | Attending: Hematology | Admitting: Hematology

## 2022-07-03 VITALS — BP 131/61 | HR 67 | Temp 97.4°F | Resp 18 | Wt 126.1 lb

## 2022-07-03 DIAGNOSIS — E039 Hypothyroidism, unspecified: Secondary | ICD-10-CM | POA: Insufficient documentation

## 2022-07-03 DIAGNOSIS — E1122 Type 2 diabetes mellitus with diabetic chronic kidney disease: Secondary | ICD-10-CM | POA: Insufficient documentation

## 2022-07-03 DIAGNOSIS — Z9221 Personal history of antineoplastic chemotherapy: Secondary | ICD-10-CM | POA: Diagnosis not present

## 2022-07-03 DIAGNOSIS — I252 Old myocardial infarction: Secondary | ICD-10-CM | POA: Insufficient documentation

## 2022-07-03 DIAGNOSIS — N189 Chronic kidney disease, unspecified: Secondary | ICD-10-CM | POA: Insufficient documentation

## 2022-07-03 DIAGNOSIS — I2510 Atherosclerotic heart disease of native coronary artery without angina pectoris: Secondary | ICD-10-CM | POA: Diagnosis not present

## 2022-07-03 DIAGNOSIS — Z95828 Presence of other vascular implants and grafts: Secondary | ICD-10-CM

## 2022-07-03 DIAGNOSIS — Z87891 Personal history of nicotine dependence: Secondary | ICD-10-CM | POA: Insufficient documentation

## 2022-07-03 DIAGNOSIS — Z5112 Encounter for antineoplastic immunotherapy: Secondary | ICD-10-CM | POA: Diagnosis not present

## 2022-07-03 DIAGNOSIS — C642 Malignant neoplasm of left kidney, except renal pelvis: Secondary | ICD-10-CM | POA: Diagnosis not present

## 2022-07-03 DIAGNOSIS — E785 Hyperlipidemia, unspecified: Secondary | ICD-10-CM | POA: Diagnosis not present

## 2022-07-03 DIAGNOSIS — K219 Gastro-esophageal reflux disease without esophagitis: Secondary | ICD-10-CM | POA: Diagnosis not present

## 2022-07-03 DIAGNOSIS — I129 Hypertensive chronic kidney disease with stage 1 through stage 4 chronic kidney disease, or unspecified chronic kidney disease: Secondary | ICD-10-CM | POA: Insufficient documentation

## 2022-07-03 DIAGNOSIS — C9000 Multiple myeloma not having achieved remission: Secondary | ICD-10-CM

## 2022-07-03 DIAGNOSIS — I700 Atherosclerosis of aorta: Secondary | ICD-10-CM | POA: Diagnosis not present

## 2022-07-03 DIAGNOSIS — C7951 Secondary malignant neoplasm of bone: Secondary | ICD-10-CM | POA: Diagnosis not present

## 2022-07-03 DIAGNOSIS — E1136 Type 2 diabetes mellitus with diabetic cataract: Secondary | ICD-10-CM | POA: Insufficient documentation

## 2022-07-03 DIAGNOSIS — M79659 Pain in unspecified thigh: Secondary | ICD-10-CM | POA: Diagnosis not present

## 2022-07-03 LAB — CBC WITH DIFFERENTIAL (CANCER CENTER ONLY)
Abs Immature Granulocytes: 0.06 10*3/uL (ref 0.00–0.07)
Basophils Absolute: 0.1 10*3/uL (ref 0.0–0.1)
Basophils Relative: 1 %
Eosinophils Absolute: 0.4 10*3/uL (ref 0.0–0.5)
Eosinophils Relative: 6 %
HCT: 39.2 % (ref 36.0–46.0)
Hemoglobin: 12.7 g/dL (ref 12.0–15.0)
Immature Granulocytes: 1 %
Lymphocytes Relative: 14 %
Lymphs Abs: 1.1 10*3/uL (ref 0.7–4.0)
MCH: 29.5 pg (ref 26.0–34.0)
MCHC: 32.4 g/dL (ref 30.0–36.0)
MCV: 91.2 fL (ref 80.0–100.0)
Monocytes Absolute: 0.6 10*3/uL (ref 0.1–1.0)
Monocytes Relative: 8 %
Neutro Abs: 5.7 10*3/uL (ref 1.7–7.7)
Neutrophils Relative %: 70 %
Platelet Count: 270 10*3/uL (ref 150–400)
RBC: 4.3 MIL/uL (ref 3.87–5.11)
RDW: 13.7 % (ref 11.5–15.5)
WBC Count: 8 10*3/uL (ref 4.0–10.5)
nRBC: 0 % (ref 0.0–0.2)

## 2022-07-03 LAB — CMP (CANCER CENTER ONLY)
ALT: 29 U/L (ref 0–44)
AST: 24 U/L (ref 15–41)
Albumin: 3.4 g/dL — ABNORMAL LOW (ref 3.5–5.0)
Alkaline Phosphatase: 69 U/L (ref 38–126)
Anion gap: 6 (ref 5–15)
BUN: 27 mg/dL — ABNORMAL HIGH (ref 8–23)
CO2: 23 mmol/L (ref 22–32)
Calcium: 8.3 mg/dL — ABNORMAL LOW (ref 8.9–10.3)
Chloride: 111 mmol/L (ref 98–111)
Creatinine: 1.58 mg/dL — ABNORMAL HIGH (ref 0.44–1.00)
GFR, Estimated: 33 mL/min — ABNORMAL LOW (ref >60)
Glucose, Bld: 233 mg/dL — ABNORMAL HIGH (ref 70–99)
Potassium: 3.9 mmol/L (ref 3.5–5.1)
Sodium: 140 mmol/L (ref 135–145)
Total Bilirubin: 0.5 mg/dL (ref 0.3–1.2)
Total Protein: 5.8 g/dL — ABNORMAL LOW (ref 6.5–8.1)

## 2022-07-03 MED ORDER — SODIUM CHLORIDE 0.9 % IV SOLN
240.0000 mg | Freq: Once | INTRAVENOUS | Status: AC
  Administered 2022-07-03: 240 mg via INTRAVENOUS
  Filled 2022-07-03: qty 24

## 2022-07-03 MED ORDER — HEPARIN SOD (PORK) LOCK FLUSH 100 UNIT/ML IV SOLN
500.0000 [IU] | Freq: Once | INTRAVENOUS | Status: AC | PRN
  Administered 2022-07-03: 500 [IU]

## 2022-07-03 MED ORDER — SODIUM CHLORIDE 0.9% FLUSH
10.0000 mL | INTRAVENOUS | Status: DC | PRN
  Administered 2022-07-03: 10 mL

## 2022-07-03 MED ORDER — SODIUM CHLORIDE 0.9 % IV SOLN: Freq: Once | INTRAVENOUS | Status: AC

## 2022-07-03 MED ORDER — DENOSUMAB 120 MG/1.7ML ~~LOC~~ SOLN
120.0000 mg | Freq: Once | SUBCUTANEOUS | Status: AC
  Administered 2022-07-03: 120 mg via SUBCUTANEOUS
  Filled 2022-07-03: qty 1.7

## 2022-07-03 MED ORDER — SODIUM CHLORIDE 0.9% FLUSH
10.0000 mL | Freq: Once | INTRAVENOUS | Status: AC
  Administered 2022-07-03: 10 mL

## 2022-07-03 MED ORDER — SODIUM CHLORIDE 0.9 % IV SOLN: INTRAVENOUS | Status: DC

## 2022-07-03 NOTE — Progress Notes (Signed)
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  SUMMARY OF ONCOLOGIC HISTORY: Oncology History  Cancer, 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[place])  01/10/2017 Initial Diagnosis   Cancer, 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[place])   01/02/2018 - 01/16/2022 Chemotherapy   Patient is on Treatment Plan : RENAL CELL CARCINOMA NIVOLUMAB Q14D     01/22/2022 Cancer Staging   Staging form: Bone - Appendicular Skeleton, Trunk, Skull, and Facial Bones, AJCC 8th Edition - Clinical: Stage IVB (pM1b) - Signed by Brunetta Genera, MD on 01/22/2022   02/01/2022 -  Chemotherapy   Patient is on Treatment Plan : RENAL CELL CARCINOMA Nivolumab (240) q14d/     Renal cell carcinoma, left (HCC)  04/11/2017 Initial Diagnosis   Renal cell carcinoma ([place])   01/02/2018 - 01/16/2022 Chemotherapy   The patient had nivolumab (OPDIVO) 240 mg in sodium chloride 0.9 % 100 mL chemo infusion, 240 mg, Intravenous, Once, 47 of 50 cycles Administration: 240 mg (01/02/2018), 240 mg (01/15/2018), 240 mg (01/30/2018), 240 mg (02/13/2018), 240 mg (02/27/2018), 240 mg (03/13/2018), 240 mg (03/27/2018), 240 mg (04/10/2018), 240 mg (04/24/2018), 240 mg (05/08/2018), 240 mg (05/22/2018), 240 mg (06/05/2018), 240 mg (06/19/2018), 240 mg (08/28/2018), 240 mg (09/11/2018), 240 mg (10/09/2018), 240 mg (10/22/2018), 240 mg (11/06/2018), 240 mg (11/20/2018), 240 mg (12/04/2018), 240 mg (12/18/2018), 240 mg (01/01/2019), 240 mg (01/15/2019), 240 mg (01/29/2019), 240 mg (02/12/2019), 240 mg (02/26/2019), 240 mg (03/12/2019), 240 mg (03/26/2019), 240 mg (04/09/2019), 240 mg (04/28/2019), 240 mg (05/12/2019), 240 mg (05/27/2019), 240 mg (06/09/2019), 240 mg (06/23/2019), 240 mg (07/07/2019), 240 mg (07/21/2019), 240 mg (08/04/2019), 240 mg (08/18/2019), 240 mg (09/03/2019), 240 mg (09/15/2019), 240 mg (09/29/2019), 240 mg (10/12/2019), 240 mg (10/27/2019), 240 mg  (11/10/2019), 240 mg (12/08/2019), 240 mg (12/22/2019), 240 mg (01/19/2020)  for chemotherapy treatment.    02/01/2022 -  Chemotherapy   Patient is on Treatment Plan : RENAL CELL CARCINOMA Nivolumab (240) q14d/       CURRENT THERAPY: metastatic renal cell carcinoma, Delton See and Nivolumab  INTERVAL HISTORY:  Audrey [name] 82 y.o. female is here for continued evaluation and management of metastatic renal cell carcinoma. She is here to continue her Xgeva and Nivolumab.  Patient was seen by me on 05/08/2022 and complained of a small wound on her right 4th toe with mild nonpurulent discharge, which caused her pain. She also endorsed occasional neuropathic pain.   Patient was last seen by PA Dede Query on 06/05/2022 and reported that her right toe wound had improved.   Today, she reports that she has been struggling since her recent loss of her husband. She reports that her right toe wound has not yet healed. She denies any cellulitis or swelling of her entire foot. She did however experience some swelling of her right ankle. Her foot infection has improved but she does experience pain with touch. She does not use any local antibiotic ointments/cream. She denies any issues with tolerating her immunotherapy.  She reports that she did have a sore throat around the time of her 06/28/2022 PET scan, though this has since resolved. She does note stable tongue discomfort as well as a mouth sore in left side of her inner cheek. Her sore has been present for years, though it has recently doubled in size, and she denies biting the area.    Patient Active Problem List  Diagnosis Date Noted   Atrophy of thyroid (acquired) 01/15/2022   At risk for falls 01/15/2022   CKD (chronic kidney disease) 01/15/2022   Diabetic ulcer of foot with fat layer exposed ([place]) 01/15/2022   Dizziness 01/15/2022   Dysuria 01/15/2022   Knee osteoarthritis 01/15/2022   Pulmonary nodule 01/15/2022   Type 2 diabetes mellitus ([place])  08/15/2021   Incisional hernia 06/23/2020   Gastritis and gastroduodenitis 02/26/2020   Rectal discomfort 02/26/2020   History of colonic polyps 02/26/2020   Ventral hernia 11/10/2019   History of cholecystectomy 07/17/2019   Degeneration of lumbar intervertebral disc 11/12/2018   Anemia 10/10/2018   Chronic cholecystitis 09/04/2018   Acute cholecystitis 08/29/2018   Biliary stricture 08/23/2018   Medicare annual wellness visit, subsequent 08/14/2018   CAD (coronary artery disease) 08/14/2018   Dyslipidemia 08/14/2018   Abnormal LFTs 08/05/2018   Choledocholithiasis 08/05/2018   Dilated bile duct 08/05/2018   History of ERCP 08/05/2018   History of renal cell carcinoma 08/05/2018   Port-A-Cath in place 01/30/2018   Counseling regarding advance care planning and goals of care 01/04/2018   Renal cell carcinoma, left ([place]) 04/11/2017   Cancer, 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[place]) 01/10/2017   Left renal mass 08/01/2016   Hyperlipidemia 05/23/2015   Essential hypertension 05/23/2015   Hypothyroidism 05/23/2015    is allergic to doxycycline, adhesive [tape], duloxetine, gabapentin (once-daily), nsaids, and statins.  MEDICAL HISTORY: Past Medical History:  Diagnosis Date   Anemia    Arthritis    lower back, hips, hands   Biliary stricture    Diabetes mellitus ([place])    type 2    Early cataracts, bilateral    Md just watching   Elevated liver enzymes    Family history of adverse reaction to anesthesia    Daughter hard to wake up   Gallstones    GERD (gastroesophageal reflux disease)    occasional - diet controlled   History of blood transfusion 2018   History of hiatal hernia    HTN (hypertension)    Hyperlipidemia    Hypothyroidism    left renal ca dx'd 2018   renal cancer - left kidney removed, pill chemo x 1 yr   Myocardial infarction ([place]) 1991   no deficits   SVD (spontaneous vaginal delivery)    x 3   Wears glasses     SURGICAL HISTORY: Past Surgical History:   Procedure Laterality Date   BALLOON DILATION N/A 07/23/2018   Procedure: BALLOON DILATION;  Surgeon: Mansouraty, Telford Nab., MD;  Location: [place];  Service: Gastroenterology;  Laterality: N/A;   BILIARY BRUSHING  08/10/2018   Procedure: BILIARY BRUSHING;  Surgeon: Rush Landmark Telford Nab., MD;  Location: [place];  Service: Gastroenterology;;   BILIARY BRUSHING  11/30/2018   Procedure: BILIARY BRUSHING;  Surgeon: Irving Copas., MD;  Location: [place];  Service: Gastroenterology;;   BILIARY DILATION  08/10/2018   Procedure: BILIARY DILATION;  Surgeon: Irving Copas., MD;  Location: [place];  Service: Gastroenterology;;   BILIARY DILATION  08/27/2018   Procedure: BILIARY DILATION;  Surgeon: Irving Copas., MD;  Location: [place];  Service: Gastroenterology;;   BILIARY DILATION  01/24/2020   Procedure: BILIARY DILATION;  Surgeon: Irving Copas., MD;  Location: [place];  Service: Gastroenterology;;   BILIARY STENT PLACEMENT  08/10/2018   Procedure: BILIARY STENT PLACEMENT;  Surgeon: Irving Copas., MD;  Location: [place];  Service: Gastroenterology;;   BILIARY STENT PLACEMENT  08/27/2018   Procedure: BILIARY  STENT PLACEMENT;  Surgeon: Mansouraty, Telford Nab., MD;  Location: [place];  Service: Gastroenterology;;   BILIARY STENT PLACEMENT  11/30/2018   Procedure: BILIARY STENT PLACEMENT;  Surgeon: Irving Copas., MD;  Location: [place];  Service: Gastroenterology;;   BIOPSY  07/23/2018   Procedure: BIOPSY;  Surgeon: Irving Copas., MD;  Location: [place];  Service: Gastroenterology;;   BIOPSY  01/24/2020   Procedure: BIOPSY;  Surgeon: Irving Copas., MD;  Location: [place];  Service: Gastroenterology;;   CHOLECYSTECTOMY N/A 09/02/2018   Procedure: LAPAROSCOPIC CHOLECYSTECTOMY;  Surgeon: Stark Klein, MD;  Location: [place];  Service: General;  Laterality: N/A;   COLONOSCOPY     normal     ENDOSCOPIC RETROGRADE CHOLANGIOPANCREATOGRAPHY (ERCP) WITH PROPOFOL N/A 08/10/2018   Procedure: ENDOSCOPIC RETROGRADE CHOLANGIOPANCREATOGRAPHY (ERCP) WITH PROPOFOL;  Surgeon: Irving Copas., MD;  Location: [place];  Service: Gastroenterology;  Laterality: N/A;   ENDOSCOPIC RETROGRADE CHOLANGIOPANCREATOGRAPHY (ERCP) WITH PROPOFOL N/A 11/30/2018   Procedure: ENDOSCOPIC RETROGRADE CHOLANGIOPANCREATOGRAPHY (ERCP) WITH PROPOFOL;  Surgeon: Rush Landmark Telford Nab., MD;  Location: [place];  Service: Gastroenterology;  Laterality: N/A;   ENDOSCOPIC RETROGRADE CHOLANGIOPANCREATOGRAPHY (ERCP) WITH PROPOFOL N/A 01/24/2020   Procedure: ENDOSCOPIC RETROGRADE CHOLANGIOPANCREATOGRAPHY (ERCP) WITH PROPOFOL;  Surgeon: Rush Landmark Telford Nab., MD;  Location: [place];  Service: Gastroenterology;  Laterality: N/A;   ERCP N/A 07/23/2018   Procedure: ENDOSCOPIC RETROGRADE CHOLANGIOPANCREATOGRAPHY (ERCP);  Surgeon: Irving Copas., MD;  Location: [place];  Service: Gastroenterology;  Laterality: N/A;   ERCP N/A 08/27/2018   Procedure: ENDOSCOPIC RETROGRADE CHOLANGIOPANCREATOGRAPHY (ERCP);  Surgeon: Irving Copas., MD;  Location: [place];  Service: Gastroenterology;  Laterality: N/A;   ESOPHAGOGASTRODUODENOSCOPY N/A 01/24/2020   Procedure: ESOPHAGOGASTRODUODENOSCOPY (EGD);  Surgeon: Irving Copas., MD;  Location: [place];  Service: Gastroenterology;  Laterality: N/A;   ESOPHAGOGASTRODUODENOSCOPY (EGD) WITH PROPOFOL N/A 08/27/2018   Procedure: ESOPHAGOGASTRODUODENOSCOPY (EGD) WITH PROPOFOL;  Surgeon: Rush Landmark Telford Nab., MD;  Location: [place];  Service: Gastroenterology;  Laterality: N/A;   EUS N/A 08/27/2018   Procedure: ESOPHAGEAL ENDOSCOPIC ULTRASOUND (EUS) RADIAL;  Surgeon: Rush Landmark Telford Nab., MD;  Location: [place];  Service: Gastroenterology;  Laterality: N/A;   FINE NEEDLE ASPIRATION  08/27/2018   Procedure: FINE NEEDLE ASPIRATION (FNA) LINEAR;   Surgeon: Irving Copas., MD;  Location: [place];  Service: Gastroenterology;;   Fatima Blank HERNIA REPAIR N/A 06/23/2020   Procedure: [place];  Surgeon: Felicie Morn, MD;  Location: [facility];  Service: General;  Laterality: N/A;  ROOM 2 STARTING AT 11:00AM FOR 90 MIN   IR IMAGING GUIDED PORT INSERTION  01/08/2018   LAPAROSCOPIC NEPHRECTOMY Left 08/01/2016   Procedure: LAPAROSCOPIC  RADICAL NEPHRECTOMY/ REPAIR OF UMBILICAL HERNIA;  Surgeon: Raynelle Bring, MD;  Location: [facility];  Service: Urology;  Laterality: Left;   REMOVAL OF STONES  07/23/2018   Procedure: REMOVAL OF GALL STONES;  Surgeon: Rush Landmark Telford Nab., MD;  Location: [place];  Service: Gastroenterology;;   REMOVAL OF STONES  08/10/2018   Procedure: REMOVAL OF STONES;  Surgeon: Irving Copas., MD;  Location: [place];  Service: Gastroenterology;;   REMOVAL OF STONES  08/27/2018   Procedure: REMOVAL OF STONES;  Surgeon: Irving Copas., MD;  Location: [place];  Service: Gastroenterology;;   REMOVAL OF STONES  01/24/2020   Procedure: REMOVAL OF STONES;  Surgeon: Irving Copas., MD;  Location: [place];  Service: Gastroenterology;;   [name]  07/23/2018   Procedure: [name];  Surgeon: Irving Copas., MD;  Location:  [facility];  Service: Gastroenterology;;   STENT REMOVAL  08/27/2018   Procedure: STENT REMOVAL;  Surgeon: Irving Copas., MD;  Location: [place];  Service: Gastroenterology;;   Lavell Islam REMOVAL  11/30/2018   Procedure: STENT REMOVAL;  Surgeon: Irving Copas., MD;  Location: [place];  Service: Gastroenterology;;   Lavell Islam REMOVAL  01/24/2020   Procedure: STENT REMOVAL;  Surgeon: Irving Copas., MD;  [facility];  Service: Gastroenterology;;   UPPER GI ENDOSCOPY     x 1   VAGINAL PROLAPSE REPAIR  11/18/2019   [facility]    SOCIAL HISTORY: Social History   Socioeconomic  History   Marital status: Married    Spouse name: Not on file   Number of children: 3   Years of education: Not on file   Highest education level: Not on file  Occupational History   Occupation: retired  Tobacco Use   Smoking status: Former    Packs/day: 1.00    Years: 8.00    Total pack years: 8.00    Types: Cigarettes    Quit date: 06/18/1964    Years since quitting: 58.0   Smokeless tobacco: Never  Vaping Use   Vaping Use: Never used  Substance and Sexual Activity   Alcohol use: No   Drug use: No   Sexual activity: Not Currently    Birth control/protection: Post-menopausal  Other Topics Concern   Not on file  Social History Narrative   Married.  Three children   Social Determinants of Health   Financial Resource Strain: Low Risk  (05/23/2022)   Overall Financial Resource Strain (CARDIA)    Difficulty of Paying Living Expenses: Not hard at all  Food Insecurity: No Food Insecurity (05/23/2022)   Hunger Vital Sign    Worried About Running Out of Food in the Last Year: Never true    Ran Out of Food in the Last Year: Never true  Transportation Needs: No Transportation Needs (05/23/2022)   PRAPARE - Hydrologist (Medical): No    Lack of Transportation (Non-Medical): No  Physical Activity: Not on file  Stress: Not on file  Social Connections: Not on file  Intimate Partner Violence: Not At Risk (05/23/2022)   Humiliation, Afraid, Rape, and Kick questionnaire    Fear of Current or Ex-Partner: No    Emotionally Abused: No    Physically Abused: No    Sexually Abused: No    FAMILY HISTORY: Family History  Problem Relation Age of Onset   Heart attack Father 22   Heart disease Brother 40       CABG   Colon cancer Neg Hx    Esophageal cancer Neg Hx    Inflammatory bowel disease Neg Hx    Liver disease Neg Hx    Pancreatic cancer Neg Hx    Rectal cancer Neg Hx    Stomach cancer Neg Hx      ROS  10 Point review of Systems was done is  negative except as noted above.   PHYSICAL EXAMINATION Vital signs reviewed Vitals:   07/03/22 1348  BP: 131/61  Pulse: 67  Resp: 18  Temp: (!) 97.4 F (36.3 C)  SpO2: 100%   GENERAL:alert, in no acute distress and comfortable SKIN: no acute rashes, no significant lesions EYES: conjunctiva are pink and non-injected, sclera anicteric OROPHARYNX: MMM, no exudates, no oropharyngeal erythema or ulceration NECK: supple, no JVD LYMPH:  no palpable lymphadenopathy in the cervical, axillary or inguinal regions  LUNGS: clear to auscultation b/l with normal respiratory effort HEART: regular rate & rhythm ABDOMEN:  normoactive bowel sounds , non tender, not distended. Extremity: no pedal edema PSYCH: alert & oriented x 3 with fluent speech NEURO: no focal motor/sensory deficits   LABORATORY DATA: .    Latest Ref Rng & Units 07/03/2022    1:15 PM 06/19/2022    8:43 AM 06/05/2022    9:43 AM  CBC  WBC 4.0 - 10.5 K/uL 8.0  10.7  8.0   Hemoglobin 12.0 - 15.0 g/dL 12.7  13.7  13.1   Hematocrit 36.0 - 46.0 % 39.2  41.4  39.6   Platelets 150 - 400 K/uL 270  270  254    .    Latest Ref Rng & Units 07/03/2022    1:15 PM 06/19/2022    8:43 AM 06/05/2022    9:43 AM  CMP  Glucose 70 - 99 mg/dL 233  145  180   BUN 8 - 23 mg/dL 27  36  32   Creatinine 0.44 - 1.00 mg/dL 1.58  1.53  1.58   Sodium 135 - 145 mmol/L 140  139  139   Potassium 3.5 - 5.1 mmol/L 3.9  4.0  3.6   Chloride 98 - 111 mmol/L 111  111  111   CO2 22 - 32 mmol/L 23  21  22    Calcium 8.9 - 10.3 mg/dL 8.3  8.9  8.9   Total Protein 6.5 - 8.1 g/dL 5.8  5.8  6.0   Total Bilirubin 0.3 - 1.2 mg/dL 0.5  0.6  0.6   Alkaline Phos 38 - 126 U/L 69  50  44   AST 15 - 41 U/L 24  38  20   ALT 0 - 44 U/L 29  38  20    RADIOLOGY    CLINICAL DATA:  Subsequent treatment strategy for metastatic renal cell carcinoma.   EXAM: NUCLEAR MEDICINE PET SKULL BASE TO THIGH   TECHNIQUE: 6.32 mCi F-18 FDG was injected intravenously. Full-ring  PET imaging was performed from the skull base to thigh after the radiotracer. CT data was obtained and used for attenuation correction and anatomic localization.   Fasting blood glucose: 159 mg/dl   COMPARISON:  PET-CT 08/29/2020 and 02/09/2020.   FINDINGS: Mediastinal blood pool activity: SUV max 2.7   NECK:   No hypermetabolic cervical lymph nodes are identified.There are no lesions of the pharyngeal mucosal space. Activity associated with the muscles of phonation is within physiologic limits and similar to previous study.   Incidental CT findings: Bilateral carotid atherosclerosis.   CHEST:   There are no hypermetabolic mediastinal, hilar or axillary lymph nodes. No hypermetabolic pulmonary activity or suspicious nodularity.   Incidental CT findings: Right IJ Port-A-Cath extends to the superior cavoatrial junction. There is diffuse atherosclerosis of the aorta, great vessels and coronary arteries. Mild linear scarring at both lung bases appears unchanged.   ABDOMEN/PELVIS:   There is no hypermetabolic activity within the liver, adrenal glands, spleen or pancreas. There is no hypermetabolic nodal activity. Stable appearance of the right kidney. No hypermetabolic activity within the left nephrectomy bed   Incidental CT findings: Diffuse aortic and branch vessel atherosclerosis, scattered colonic diverticulosis and pelvic floor laxity are again noted.   SKELETON:   There is no hypermetabolic activity to suggest osseous metastatic disease.   Incidental CT findings: Grossly stable sclerotic lesions within the thoracic spine, without hypermetabolic activity. Lower lumbar spondylosis and sacroiliac degenerative changes  bilaterally.   IMPRESSION: 1. Stable PET-CT status post left nephrectomy. No evidence of local recurrence or hypermetabolic metastatic disease. 2. Stable incidental findings including coronary and Aortic Atherosclerosis (ICD10-I70.0). Pelvic floor  laxity.     Electronically Signed   By: Richardean Sale M.D.   On: 04/03/2021 12:30      ASSESSMENT and PLAN:   MERL ANCELET is a 82 y.o. female who returns for a follow up for metastatic renal cell carcinoma.   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 -Rt adrenal gland bx - showed clear cell RCC -S/p Cytoreductive left radical nephrectomy and left adrenal gland resection on 08/01/2016 by Dr Alinda Money. -Started systemic therapy with Nivolumab 240 mg on 01/02/2018 and Xgeva on 02/27/2018.  -Most recent PET scan from 11/09/2021 showed no clear evidence of new or progressive disease.   # Hypothyroidism/Adrenal insufficiency/Diabetes -Continue being followed by Dr. Buddy Duty   #H/o Choledocholithiasis and cholelithiasis -07/10/18 US Abdomen revealed Biliary duct dilatation with 8 mm calculus at the level of the ampulla in the distal common bile duct. 2. Cholelithiasis. No gallbladder wall thickening or pericholecystic fluid. 3. Appearance of the liver raises concern for underlying hepatic cirrhosis. No focal liver lesions are demonstrable. 4.  Left kidney absent. 5.  Small cysts in right kidney. 6.  Aortic Atherosclerosis. -S/p ERCP on 07/23/18 with Dr. Valarie Merino Mansouraty -09/02/18 Cytopathology from ERCP was suspicious for malignant cells in bile duct, but was not definitive, other two findings were benign.  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 --Recent completed course of cephalexin therapy approximately one week ago --Recommend follow up with wound care. Sent another round of abx therapy with Augmentin as patient is unable to tolerate doxycycline and has D2D interaction with bactrim. Trying to avoid clindamycin due to age and risk of C.diff.   PLAN:    -Discussed lab results on 07/03/2022 with patient. CBC showed WBC of 8.0K, hemoglobin of 12.7,  and platelets of 270K. -CMP pending -Recent PET scan 06/28/2022 revealed normal results with no findings for metastatic disease.  -recommend patient to use OTC betadine ointment to improve her LE wound -discussed option of discontinuing immunotherapy giving continued NED status of her metastatic RCC, however, patient would like to continue to receive immunotherapy as she is not experiencing any toxicities  FOLLOW-UP: Per integrated scheduling  The total time spent in the appointment was 25 minutes* .  All of the patient's questions were answered with apparent satisfaction. The patient knows to call the clinic with any problems, questions or concerns.   Sullivan Lone MD MS AAHIVMS [facility] Hematology/Oncology Physician [facility]  .*Total Encounter Time as defined by the Centers for Medicare and Medicaid Services includes, in addition to the face-to-face time of a patient visit (documented in the note above) non-face-to-face time: obtaining and reviewing outside history, ordering and reviewing medications, tests or procedures, care coordination (communications with other health care professionals or caregivers) and documentation in the medical record.    I,Mitra Faeizi,acting as a Education administrator for Sullivan Lone, MD.,have documented all relevant documentation on the behalf of Sullivan Lone, MD,as directed by  Sullivan Lone, MD while in the presence of Sullivan Lone, MD.   .I have reviewed the above documentation for accuracy and completeness, and I agree with the above. Brunetta Genera MD

## 2022-07-03 NOTE — Progress Notes (Signed)
Patient seen by MD today  Vitals are within treatment parameters.  Labs reviewed: and are not all within treatment parameters. Dr Irene Limbo aware  Cr: 1.58  Per physician team, patient is ready for treatment and there are NO modifications to the treatment plan.

## 2022-07-09 ENCOUNTER — Encounter: Payer: Self-pay | Admitting: Hematology

## 2022-07-17 ENCOUNTER — Inpatient Hospital Stay: Payer: Medicare Other

## 2022-07-17 VITALS — BP 117/52 | HR 71 | Temp 98.4°F | Resp 18

## 2022-07-17 DIAGNOSIS — C642 Malignant neoplasm of left kidney, except renal pelvis: Secondary | ICD-10-CM

## 2022-07-17 DIAGNOSIS — Z5112 Encounter for antineoplastic immunotherapy: Secondary | ICD-10-CM | POA: Diagnosis not present

## 2022-07-17 DIAGNOSIS — C7951 Secondary malignant neoplasm of bone: Secondary | ICD-10-CM

## 2022-07-17 LAB — CBC WITH DIFFERENTIAL (CANCER CENTER ONLY)
Abs Immature Granulocytes: 0.12 10*3/uL — ABNORMAL HIGH (ref 0.00–0.07)
Basophils Absolute: 0.1 10*3/uL (ref 0.0–0.1)
Basophils Relative: 1 %
Eosinophils Absolute: 0.5 10*3/uL (ref 0.0–0.5)
Eosinophils Relative: 5 %
HCT: 40.5 % (ref 36.0–46.0)
Hemoglobin: 13.3 g/dL (ref 12.0–15.0)
Immature Granulocytes: 1 %
Lymphocytes Relative: 13 %
Lymphs Abs: 1.2 10*3/uL (ref 0.7–4.0)
MCH: 29.8 pg (ref 26.0–34.0)
MCHC: 32.8 g/dL (ref 30.0–36.0)
MCV: 90.8 fL (ref 80.0–100.0)
Monocytes Absolute: 1 10*3/uL (ref 0.1–1.0)
Monocytes Relative: 11 %
Neutro Abs: 6.8 10*3/uL (ref 1.7–7.7)
Neutrophils Relative %: 69 %
Platelet Count: 250 10*3/uL (ref 150–400)
RBC: 4.46 MIL/uL (ref 3.87–5.11)
RDW: 13.6 % (ref 11.5–15.5)
WBC Count: 9.8 10*3/uL (ref 4.0–10.5)
nRBC: 0 % (ref 0.0–0.2)

## 2022-07-17 LAB — CMP (CANCER CENTER ONLY)
ALT: 22 U/L (ref 0–44)
AST: 20 U/L (ref 15–41)
Albumin: 3.7 g/dL (ref 3.5–5.0)
Alkaline Phosphatase: 54 U/L (ref 38–126)
Anion gap: 7 (ref 5–15)
BUN: 33 mg/dL — ABNORMAL HIGH (ref 8–23)
CO2: 22 mmol/L (ref 22–32)
Calcium: 8.9 mg/dL (ref 8.9–10.3)
Chloride: 111 mmol/L (ref 98–111)
Creatinine: 1.49 mg/dL — ABNORMAL HIGH (ref 0.44–1.00)
GFR, Estimated: 35 mL/min — ABNORMAL LOW (ref >60)
Glucose, Bld: 124 mg/dL — ABNORMAL HIGH (ref 70–99)
Potassium: 4.2 mmol/L (ref 3.5–5.1)
Sodium: 140 mmol/L (ref 135–145)
Total Bilirubin: 0.6 mg/dL (ref 0.3–1.2)
Total Protein: 6.2 g/dL — ABNORMAL LOW (ref 6.5–8.1)

## 2022-07-17 LAB — TSH: TSH: 1.422 u[IU]/mL (ref 0.350–4.500)

## 2022-07-17 MED ORDER — HEPARIN SOD (PORK) LOCK FLUSH 100 UNIT/ML IV SOLN
500.0000 [IU] | Freq: Once | INTRAVENOUS | Status: AC | PRN
  Administered 2022-07-17: 500 [IU]

## 2022-07-17 MED ORDER — SODIUM CHLORIDE 0.9 % IV SOLN
240.0000 mg | Freq: Once | INTRAVENOUS | Status: AC
  Administered 2022-07-17: 240 mg via INTRAVENOUS
  Filled 2022-07-17: qty 24

## 2022-07-17 MED ORDER — SODIUM CHLORIDE 0.9 % IV SOLN: Freq: Once | INTRAVENOUS | Status: AC

## 2022-07-17 MED ORDER — SODIUM CHLORIDE 0.9% FLUSH
10.0000 mL | INTRAVENOUS | Status: DC | PRN
  Administered 2022-07-17: 10 mL

## 2022-07-19 LAB — T4: T4, Total: 9.6 ug/dL (ref 4.5–12.0)

## 2022-07-29 ENCOUNTER — Other Ambulatory Visit: Payer: Self-pay

## 2022-07-29 DIAGNOSIS — C7951 Secondary malignant neoplasm of bone: Secondary | ICD-10-CM

## 2022-07-31 ENCOUNTER — Inpatient Hospital Stay: Payer: Medicare Other | Attending: Hematology

## 2022-07-31 ENCOUNTER — Other Ambulatory Visit: Payer: Self-pay | Admitting: Hematology

## 2022-07-31 ENCOUNTER — Inpatient Hospital Stay (HOSPITAL_BASED_OUTPATIENT_CLINIC_OR_DEPARTMENT_OTHER): Payer: Medicare Other | Admitting: Physician Assistant

## 2022-07-31 ENCOUNTER — Inpatient Hospital Stay: Payer: Medicare Other

## 2022-07-31 VITALS — BP 145/69 | HR 77 | Temp 97.9°F | Resp 18 | Ht 59.0 in | Wt 123.3 lb

## 2022-07-31 VITALS — BP 131/65 | HR 70 | Temp 98.3°F | Resp 17

## 2022-07-31 DIAGNOSIS — C7972 Secondary malignant neoplasm of left adrenal gland: Secondary | ICD-10-CM | POA: Diagnosis not present

## 2022-07-31 DIAGNOSIS — Z5112 Encounter for antineoplastic immunotherapy: Secondary | ICD-10-CM | POA: Diagnosis present

## 2022-07-31 DIAGNOSIS — C642 Malignant neoplasm of left kidney, except renal pelvis: Secondary | ICD-10-CM | POA: Diagnosis not present

## 2022-07-31 DIAGNOSIS — T148XXA Other injury of unspecified body region, initial encounter: Secondary | ICD-10-CM | POA: Diagnosis not present

## 2022-07-31 DIAGNOSIS — C7951 Secondary malignant neoplasm of bone: Secondary | ICD-10-CM

## 2022-07-31 DIAGNOSIS — N281 Cyst of kidney, acquired: Secondary | ICD-10-CM | POA: Insufficient documentation

## 2022-07-31 DIAGNOSIS — C7801 Secondary malignant neoplasm of right lung: Secondary | ICD-10-CM | POA: Insufficient documentation

## 2022-07-31 DIAGNOSIS — E2740 Unspecified adrenocortical insufficiency: Secondary | ICD-10-CM | POA: Insufficient documentation

## 2022-07-31 DIAGNOSIS — C7971 Secondary malignant neoplasm of right adrenal gland: Secondary | ICD-10-CM | POA: Diagnosis not present

## 2022-07-31 DIAGNOSIS — Z9049 Acquired absence of other specified parts of digestive tract: Secondary | ICD-10-CM | POA: Diagnosis not present

## 2022-07-31 DIAGNOSIS — Z95828 Presence of other vascular implants and grafts: Secondary | ICD-10-CM

## 2022-07-31 DIAGNOSIS — E039 Hypothyroidism, unspecified: Secondary | ICD-10-CM | POA: Diagnosis not present

## 2022-07-31 DIAGNOSIS — E1122 Type 2 diabetes mellitus with diabetic chronic kidney disease: Secondary | ICD-10-CM | POA: Diagnosis not present

## 2022-07-31 DIAGNOSIS — E1136 Type 2 diabetes mellitus with diabetic cataract: Secondary | ICD-10-CM | POA: Insufficient documentation

## 2022-07-31 DIAGNOSIS — C7802 Secondary malignant neoplasm of left lung: Secondary | ICD-10-CM | POA: Diagnosis not present

## 2022-07-31 LAB — CMP (CANCER CENTER ONLY)
ALT: 39 U/L (ref 0–44)
AST: 25 U/L (ref 15–41)
Albumin: 3.6 g/dL (ref 3.5–5.0)
Alkaline Phosphatase: 67 U/L (ref 38–126)
Anion gap: 4 — ABNORMAL LOW (ref 5–15)
BUN: 24 mg/dL — ABNORMAL HIGH (ref 8–23)
CO2: 23 mmol/L (ref 22–32)
Calcium: 9 mg/dL (ref 8.9–10.3)
Chloride: 113 mmol/L — ABNORMAL HIGH (ref 98–111)
Creatinine: 1.37 mg/dL — ABNORMAL HIGH (ref 0.44–1.00)
GFR, Estimated: 39 mL/min — ABNORMAL LOW (ref >60)
Glucose, Bld: 188 mg/dL — ABNORMAL HIGH (ref 70–99)
Potassium: 3.6 mmol/L (ref 3.5–5.1)
Sodium: 140 mmol/L (ref 135–145)
Total Bilirubin: 0.4 mg/dL (ref 0.3–1.2)
Total Protein: 6.3 g/dL — ABNORMAL LOW (ref 6.5–8.1)

## 2022-07-31 LAB — CBC WITH DIFFERENTIAL (CANCER CENTER ONLY)
Abs Immature Granulocytes: 0.14 10*3/uL — ABNORMAL HIGH (ref 0.00–0.07)
Basophils Absolute: 0.1 10*3/uL (ref 0.0–0.1)
Basophils Relative: 1 %
Eosinophils Absolute: 0.8 10*3/uL — ABNORMAL HIGH (ref 0.0–0.5)
Eosinophils Relative: 6 %
HCT: 41.4 % (ref 36.0–46.0)
Hemoglobin: 14 g/dL (ref 12.0–15.0)
Immature Granulocytes: 1 %
Lymphocytes Relative: 14 %
Lymphs Abs: 1.7 10*3/uL (ref 0.7–4.0)
MCH: 29.7 pg (ref 26.0–34.0)
MCHC: 33.8 g/dL (ref 30.0–36.0)
MCV: 87.9 fL (ref 80.0–100.0)
Monocytes Absolute: 0.9 10*3/uL (ref 0.1–1.0)
Monocytes Relative: 7 %
Neutro Abs: 8.3 10*3/uL — ABNORMAL HIGH (ref 1.7–7.7)
Neutrophils Relative %: 71 %
Platelet Count: 279 10*3/uL (ref 150–400)
RBC: 4.71 MIL/uL (ref 3.87–5.11)
RDW: 13.9 % (ref 11.5–15.5)
WBC Count: 11.8 10*3/uL — ABNORMAL HIGH (ref 4.0–10.5)
nRBC: 0 % (ref 0.0–0.2)

## 2022-07-31 MED ORDER — SODIUM CHLORIDE 0.9 % IV SOLN: Freq: Once | INTRAVENOUS | Status: AC

## 2022-07-31 MED ORDER — SODIUM CHLORIDE 0.9% FLUSH
10.0000 mL | INTRAVENOUS | Status: DC | PRN
  Administered 2022-07-31: 10 mL

## 2022-07-31 MED ORDER — HEPARIN SOD (PORK) LOCK FLUSH 100 UNIT/ML IV SOLN
500.0000 [IU] | Freq: Once | INTRAVENOUS | Status: AC | PRN
  Administered 2022-07-31: 500 [IU]

## 2022-07-31 MED ORDER — SODIUM CHLORIDE 0.9% FLUSH
10.0000 mL | Freq: Once | INTRAVENOUS | Status: AC | PRN
  Administered 2022-07-31: 10 mL

## 2022-07-31 MED ORDER — SODIUM CHLORIDE 0.9 % IV SOLN
240.0000 mg | Freq: Once | INTRAVENOUS | Status: AC
  Administered 2022-07-31: 240 mg via INTRAVENOUS
  Filled 2022-07-31: qty 24

## 2022-07-31 NOTE — Progress Notes (Signed)
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  SUMMARY OF ONCOLOGIC HISTORY: Oncology History  Cancer, metastatic to bone  01/10/2017 Initial Diagnosis   Cancer, 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HCC)   01/02/2018 - 01/16/2022 Chemotherapy   Patient is on Treatment Plan : RENAL CELL CARCINOMA NIVOLUMAB Q14D     01/22/2022 Cancer Staging   Staging form: Bone - Appendicular Skeleton, Trunk, Skull, and Facial Bones, AJCC 8th Edition - Clinical: Stage IVB (pM1b) - Signed by Johney Maine, MD on 01/22/2022   02/01/2022 -  Chemotherapy   Patient is on Treatment Plan : RENAL CELL CARCINOMA Nivolumab (240) q14d/     Renal cell carcinoma, left  04/11/2017 Initial Diagnosis   Renal cell carcinoma (HCC)   01/02/2018 - 01/16/2022 Chemotherapy   The patient had nivolumab (OPDIVO) 240 mg in sodium chloride 0.9 % 100 mL chemo infusion, 240 mg, Intravenous, Once, 47 of 50 cycles Administration: 240 mg (01/02/2018), 240 mg (01/15/2018), 240 mg (01/30/2018), 240 mg (02/13/2018), 240 mg (02/27/2018), 240 mg (03/13/2018), 240 mg (03/27/2018), 240 mg (04/10/2018), 240 mg (04/24/2018), 240 mg (05/08/2018), 240 mg (05/22/2018), 240 mg (06/05/2018), 240 mg (06/19/2018), 240 mg (08/28/2018), 240 mg (09/11/2018), 240 mg (10/09/2018), 240 mg (10/22/2018), 240 mg (11/06/2018), 240 mg (11/20/2018), 240 mg (12/04/2018), 240 mg (12/18/2018), 240 mg (01/01/2019), 240 mg (01/15/2019), 240 mg (01/29/2019), 240 mg (02/12/2019), 240 mg (02/26/2019), 240 mg (03/12/2019), 240 mg (03/26/2019), 240 mg (04/09/2019), 240 mg (04/28/2019), 240 mg (05/12/2019), 240 mg (05/27/2019), 240 mg (06/09/2019), 240 mg (06/23/2019), 240 mg (07/07/2019), 240 mg (07/21/2019), 240 mg (08/04/2019), 240 mg (08/18/2019), 240 mg (09/03/2019), 240 mg (09/15/2019), 240 mg (09/29/2019), 240 mg (10/12/2019), 240 mg (10/27/2019), 240 mg (11/10/2019), 240  mg (12/08/2019), 240 mg (12/22/2019), 240 mg (01/19/2020)  for chemotherapy treatment.    02/01/2022 -  Chemotherapy   Patient is on Treatment Plan : RENAL CELL CARCINOMA Nivolumab (240) q14d/       CURRENT THERAPY: metastatic renal cell carcinoma, Rivka Barbara and Nivolumab  INTERVAL HISTORY:  Audrey [name] 82 y.o. female is here for continued evaluation and management of metastatic renal cell carcinoma. She is here to continue her Nivolumab fherapy.Patient was last seen by Dr. Candise Che on 07/03/2022. She presents to the clinic unaccompanied.   [name] reports that she continues to struggle with her chronic right foot wound that is managed by her PCP who does wound care. She adds that the wound was recent debrided which was very painful. She would like to be evaluated by vascular surgery to determine if any further interventions are available.   She continues to tolerate her immunotherapy without any new or concerning symptoms.She reports that her energy levels are overall stable. She denies any appetite or weight changes. She denies any GI symptoms such as nausea, vomiting, diarrhea or constipation. She denies easy bruising or signs of bleeding. She deneies fevers, chills, sweats, shortness of breath, chest pain or cough. She has no other complaints.  Patient Active Problem List   Diagnosis Date Noted   Atrophy of thyroid (acquired) 01/15/2022   At risk for falls 01/15/2022   CKD (chronic kidney disease) 01/15/2022   Diabetic ulcer of foot with fat layer exposed 01/15/2022   Dizziness 01/15/2022   Dysuria 01/15/2022   Knee osteoarthritis 01/15/2022   Pulmonary nodule 01/15/2022   Type 2 diabetes mellitus 08/15/2021  Incisional hernia 06/23/2020   Gastritis and gastroduodenitis 02/26/2020   Rectal discomfort 02/26/2020   History of colonic polyps 02/26/2020   Ventral hernia 11/10/2019   History of cholecystectomy 07/17/2019   Degeneration of lumbar intervertebral disc 11/12/2018   Anemia  10/10/2018   Chronic cholecystitis 09/04/2018   Acute cholecystitis 08/29/2018   Biliary stricture 08/23/2018   Medicare annual wellness visit, subsequent 08/14/2018   CAD (coronary artery disease) 08/14/2018   Dyslipidemia 08/14/2018   Abnormal LFTs 08/05/2018   Choledocholithiasis 08/05/2018   Dilated bile duct 08/05/2018   History of ERCP 08/05/2018   History of renal cell carcinoma 08/05/2018   Port-A-Cath in place 01/30/2018   Counseling regarding advance care planning and goals of care 01/04/2018   Renal cell carcinoma, left 04/11/2017   Cancer, metastatic to bone 01/10/2017   Left renal mass 08/01/2016   Hyperlipidemia 05/23/2015   Essential hypertension 05/23/2015   Hypothyroidism 05/23/2015    is allergic to doxycycline, adhesive [tape], duloxetine, gabapentin (once-daily), nsaids, and statins.  MEDICAL HISTORY: Past Medical History:  Diagnosis Date   Anemia    Arthritis    lower back, hips, hands   Biliary stricture    Diabetes mellitus (HCC)    type 2    Early cataracts, bilateral    Md just watching   Elevated liver enzymes    Family history of adverse reaction to anesthesia    Daughter hard to wake up   Gallstones    GERD (gastroesophageal reflux disease)    occasional - diet controlled   History of blood transfusion 2018   History of hiatal hernia    HTN (hypertension)    Hyperlipidemia    Hypothyroidism    left renal ca dx'd 2018   renal cancer - left kidney removed, pill chemo x 1 yr   Myocardial infarction (HCC) 1991   no deficits   SVD (spontaneous vaginal delivery)    x 3   Wears glasses     SURGICAL HISTORY: Past Surgical History:  Procedure Laterality Date   BALLOON DILATION N/A 07/23/2018   Procedure: BALLOON DILATION;  Surgeon: Mansouraty, Netty Starring., MD;  Location: [facility] ENDOSCOPY;  Service: Gastroenterology;  Laterality: N/A;   BILIARY BRUSHING  08/10/2018   Procedure: BILIARY BRUSHING;  Surgeon: Meridee Score Netty Starring., MD;  Location:  [facility] ENDOSCOPY;  Service: Gastroenterology;;   BILIARY BRUSHING  11/30/2018   Procedure: BILIARY BRUSHING;  Surgeon: Lemar Lofty., MD;  Location: [facility] ENDOSCOPY;  Service: Gastroenterology;;   BILIARY DILATION  08/10/2018   Procedure: BILIARY DILATION;  Surgeon: Lemar Lofty., MD;  Location: [facility] ENDOSCOPY;  Service: Gastroenterology;;   BILIARY DILATION  08/27/2018   Procedure: BILIARY DILATION;  Surgeon: Lemar Lofty., MD;  Location: [facility] ENDOSCOPY;  Service: Gastroenterology;;   BILIARY DILATION  01/24/2020   Procedure: BILIARY DILATION;  Surgeon: Lemar Lofty., MD;  Location: [facility] ENDOSCOPY;  Service: Gastroenterology;;   BILIARY STENT PLACEMENT  08/10/2018   Procedure: BILIARY STENT PLACEMENT;  Surgeon: Lemar Lofty., MD;  Location: [facility] ENDOSCOPY;  Service: Gastroenterology;;   BILIARY STENT PLACEMENT  08/27/2018   Procedure: BILIARY STENT PLACEMENT;  Surgeon: Lemar Lofty., MD;  Location: [facility] ENDOSCOPY;  Service: Gastroenterology;;   BILIARY STENT PLACEMENT  11/30/2018   Procedure: BILIARY STENT PLACEMENT;  Surgeon: Lemar Lofty., MD;  Location: [facility] ENDOSCOPY;  Service: Gastroenterology;;   BIOPSY  07/23/2018   Procedure: BIOPSY;  Surgeon: Lemar Lofty., MD;  Location: [facility] ENDOSCOPY;  Service: Gastroenterology;;   BIOPSY  01/24/2020   Procedure: BIOPSY;  Surgeon: Lemar Lofty., MD;  Location: [facility] ENDOSCOPY;  Service: Gastroenterology;;   CHOLECYSTECTOMY N/A 09/02/2018   Procedure: LAPAROSCOPIC CHOLECYSTECTOMY;  Surgeon: Almond Lint, MD;  Location: [facility] OR;  Service: General;  Laterality: N/A;   COLONOSCOPY     normal    ENDOSCOPIC RETROGRADE CHOLANGIOPANCREATOGRAPHY (ERCP) WITH PROPOFOL N/A 08/10/2018   Procedure: ENDOSCOPIC RETROGRADE CHOLANGIOPANCREATOGRAPHY (ERCP) WITH PROPOFOL;  Surgeon: Lemar Lofty., MD;  Location: [facility] ENDOSCOPY;  Service: Gastroenterology;  Laterality: N/A;   ENDOSCOPIC RETROGRADE  CHOLANGIOPANCREATOGRAPHY (ERCP) WITH PROPOFOL N/A 11/30/2018   Procedure: ENDOSCOPIC RETROGRADE CHOLANGIOPANCREATOGRAPHY (ERCP) WITH PROPOFOL;  Surgeon: Meridee Score Netty Starring., MD;  Location: [facility] ENDOSCOPY;  Service: Gastroenterology;  Laterality: N/A;   ENDOSCOPIC RETROGRADE CHOLANGIOPANCREATOGRAPHY (ERCP) WITH PROPOFOL N/A 01/24/2020   Procedure: ENDOSCOPIC RETROGRADE CHOLANGIOPANCREATOGRAPHY (ERCP) WITH PROPOFOL;  Surgeon: Meridee Score Netty Starring., MD;  Location: [facility] ENDOSCOPY;  Service: Gastroenterology;  Laterality: N/A;   ERCP N/A 07/23/2018   Procedure: ENDOSCOPIC RETROGRADE CHOLANGIOPANCREATOGRAPHY (ERCP);  Surgeon: Lemar Lofty., MD;  Location: [facility] ENDOSCOPY;  Service: Gastroenterology;  Laterality: N/A;   ERCP N/A 08/27/2018   Procedure: ENDOSCOPIC RETROGRADE CHOLANGIOPANCREATOGRAPHY (ERCP);  Surgeon: Lemar Lofty., MD;  Location: [facility] ENDOSCOPY;  Service: Gastroenterology;  Laterality: N/A;   ESOPHAGOGASTRODUODENOSCOPY N/A 01/24/2020   Procedure: ESOPHAGOGASTRODUODENOSCOPY (EGD);  Surgeon: Lemar Lofty., MD;  Location: [facility] ENDOSCOPY;  Service: Gastroenterology;  Laterality: N/A;   ESOPHAGOGASTRODUODENOSCOPY (EGD) WITH PROPOFOL N/A 08/27/2018   Procedure: ESOPHAGOGASTRODUODENOSCOPY (EGD) WITH PROPOFOL;  Surgeon: Meridee Score Netty Starring., MD;  Location: [facility] ENDOSCOPY;  Service: Gastroenterology;  Laterality: N/A;   EUS N/A 08/27/2018   Procedure: ESOPHAGEAL ENDOSCOPIC ULTRASOUND (EUS) RADIAL;  Surgeon: Meridee Score Netty Starring., MD;  Location: [facility] ENDOSCOPY;  Service: Gastroenterology;  Laterality: N/A;   FINE NEEDLE ASPIRATION  08/27/2018   Procedure: FINE NEEDLE ASPIRATION (FNA) LINEAR;  Surgeon: Lemar Lofty., MD;  Location: [facility] ENDOSCOPY;  Service: Gastroenterology;;   Sherald Hess HERNIA REPAIR N/A 06/23/2020   Procedure: OPEN INCISIONAL HERNIA REPAIR WITH MESH;  Surgeon: Quentin Ore, MD;  Location: [facility] ORS;  Service: General;  Laterality: N/A;  ROOM 2 STARTING AT  11:00AM FOR 90 MIN   IR IMAGING GUIDED PORT INSERTION  01/08/2018   LAPAROSCOPIC NEPHRECTOMY Left 08/01/2016   Procedure: LAPAROSCOPIC  RADICAL NEPHRECTOMY/ REPAIR OF UMBILICAL HERNIA;  Surgeon: Heloise Purpura, MD;  Location: [facility] ORS;  Service: Urology;  Laterality: Left;   REMOVAL OF STONES  07/23/2018   Procedure: REMOVAL OF GALL STONES;  Surgeon: Meridee Score Netty Starring., MD;  Location: [facility] ENDOSCOPY;  Service: Gastroenterology;;   REMOVAL OF STONES  08/10/2018   Procedure: REMOVAL OF STONES;  Surgeon: Lemar Lofty., MD;  Location: [facility] ENDOSCOPY;  Service: Gastroenterology;;   REMOVAL OF STONES  08/27/2018   Procedure: REMOVAL OF STONES;  Surgeon: Lemar Lofty., MD;  Location: [facility] ENDOSCOPY;  Service: Gastroenterology;;   REMOVAL OF STONES  01/24/2020   Procedure: REMOVAL OF STONES;  Surgeon: Lemar Lofty., MD;  Location: [facility] ENDOSCOPY;  Service: Gastroenterology;;   Dennison Mascot  07/23/2018   Procedure: Dennison Mascot;  Surgeon: Lemar Lofty., MD;  Location: [facility] ENDOSCOPY;  Service: Gastroenterology;;   Francine Graven REMOVAL  08/27/2018   Procedure: STENT REMOVAL;  Surgeon: Lemar Lofty., MD;  Location: [facility] ENDOSCOPY;  Service: Gastroenterology;;   Francine Graven REMOVAL  11/30/2018   Procedure: STENT REMOVAL;  Surgeon: Lemar Lofty., MD;  Location: [facility] ENDOSCOPY;  Service: Gastroenterology;;   STENT REMOVAL  01/24/2020   Procedure: STENT REMOVAL;  Surgeon: Meridee Score,  Netty StarringGabriel Jr., MD;  Location: [facility] ENDOSCOPY;  Service: Gastroenterology;;   UPPER GI ENDOSCOPY     x 1   VAGINAL PROLAPSE REPAIR  11/18/2019   [facility]    SOCIAL HISTORY: Social History   Socioeconomic History   Marital status: Widowed    Spouse name: Not on file   Number of children: 3   Years of education: Not on file   Highest education level: Not on file  Occupational History   Occupation: retired  Tobacco Use   Smoking status: Former    Packs/day: 1.00    Years: 8.00    Additional  pack years: 0.00    Total pack years: 8.00    Types: Cigarettes    Quit date: 06/18/1964    Years since quitting: 58.1   Smokeless tobacco: Never  Vaping Use   Vaping Use: Never used  Substance and Sexual Activity   Alcohol use: No   Drug use: No   Sexual activity: Not Currently    Birth control/protection: Post-menopausal  Other Topics Concern   Not on file  Social History Narrative   Married.  Three children   Social Determinants of Health   Financial Resource Strain: Low Risk  (05/23/2022)   Overall Financial Resource Strain (CARDIA)    Difficulty of Paying Living Expenses: Not hard at all  Food Insecurity: No Food Insecurity (05/23/2022)   Hunger Vital Sign    Worried About Running Out of Food in the Last Year: Never true    Ran Out of Food in the Last Year: Never true  Transportation Needs: No Transportation Needs (05/23/2022)   PRAPARE - Administrator, Civil ServiceTransportation    Lack of Transportation (Medical): No    Lack of Transportation (Non-Medical): No  Physical Activity: Not on file  Stress: Not on file  Social Connections: Not on file  Intimate Partner Violence: Not At Risk (05/23/2022)   Humiliation, Afraid, Rape, and Kick questionnaire    Fear of Current or Ex-Partner: No    Emotionally Abused: No    Physically Abused: No    Sexually Abused: No    FAMILY HISTORY: Family History  Problem Relation Age of Onset   Heart attack Father 4649   Heart disease Brother 3450       CABG   Colon cancer Neg Hx    Esophageal cancer Neg Hx    Inflammatory bowel disease Neg Hx    Liver disease Neg Hx    Pancreatic cancer Neg Hx    Rectal cancer Neg Hx    Stomach cancer Neg Hx      ROS  10 Point review of Systems was done is negative except as noted above.   PHYSICAL EXAMINATION Vital signs reviewed Vitals:   07/31/22 1315  BP: (!) 145/69  Pulse: 77  Resp: 18  Temp: 97.9 F (36.6 C)  SpO2: 99%   GENERAL:alert, in no acute distress and comfortable SKIN: no acute rashes, no  significant lesions EYES: conjunctiva are pink and non-injected, sclera anicteric LYMPH:  no palpable lymphadenopathy in the cervical or supraclavicular regions LUNGS: clear to auscultation b/l with normal respiratory effort HEART: regular rate & rhythm Extremity: no pedal edema PSYCH: alert & oriented x 3 with fluent speech NEURO: no focal motor/sensory deficits   LABORATORY DATA: .    Latest Ref Rng & Units 07/31/2022   12:45 PM 07/17/2022    8:30 AM 07/03/2022    1:15 PM  CBC  WBC 4.0 - 10.5 K/uL 11.8  9.8  8.0   Hemoglobin 12.0 - 15.0 g/dL 60.4  54.0  98.1   Hematocrit 36.0 - 46.0 % 41.4  40.5  39.2   Platelets 150 - 400 K/uL 279  250  270    .    Latest Ref Rng & Units 07/31/2022   12:45 PM 07/17/2022    8:30 AM 07/03/2022    1:15 PM  CMP  Glucose 70 - 99 mg/dL 191  478  295   BUN 8 - 23 mg/dL 24  33  27   Creatinine 0.44 - 1.00 mg/dL 6.21  3.08  6.57   Sodium 135 - 145 mmol/L 140  140  140   Potassium 3.5 - 5.1 mmol/L 3.6  4.2  3.9   Chloride 98 - 111 mmol/L 113  111  111   CO2 22 - 32 mmol/L Calcium 8.9 - 10.3 mg/dL 9.0  8.9  8.3   Total Protein 6.5 - 8.1 g/dL 6.3  6.2  5.8   Total Bilirubin 0.3 - 1.2 mg/dL 0.4  0.6  0.5   Alkaline Phos 38 - 126 U/L 67  54  69   AST 15 - 41 U/L ALT 0 - 44 U/L 39  22  29    RADIOLOGY    CLINICAL DATA:  Subsequent treatment strategy for metastatic renal cell carcinoma.   EXAM: NUCLEAR MEDICINE PET SKULL BASE TO THIGH   TECHNIQUE: 6.32 mCi F-18 FDG was injected intravenously. Full-ring PET imaging was performed from the skull base to thigh after the radiotracer. CT data was obtained and used for attenuation correction and anatomic localization.   Fasting blood glucose: 159 mg/dl   COMPARISON:  PET-CT 84/69/6295 and 02/09/2020.   FINDINGS: Mediastinal blood pool activity: SUV max 2.7   NECK:   No hypermetabolic cervical lymph nodes are identified.There are no lesions of the pharyngeal mucosal  space. Activity associated with the muscles of phonation is within physiologic limits and similar to previous study.   Incidental CT findings: Bilateral carotid atherosclerosis.   CHEST:   There are no hypermetabolic mediastinal, hilar or axillary lymph nodes. No hypermetabolic pulmonary activity or suspicious nodularity.   Incidental CT findings: Right IJ Port-A-Cath extends to the superior cavoatrial junction. There is diffuse atherosclerosis of the aorta, great vessels and coronary arteries. Mild linear scarring at both lung bases appears unchanged.   ABDOMEN/PELVIS:   There is no hypermetabolic activity within the liver, adrenal glands, spleen or pancreas. There is no hypermetabolic nodal activity. Stable appearance of the right kidney. No hypermetabolic activity within the left nephrectomy bed   Incidental CT findings: Diffuse aortic and branch vessel atherosclerosis, scattered colonic diverticulosis and pelvic floor laxity are again noted.   SKELETON:   There is no hypermetabolic activity to suggest osseous metastatic disease.   Incidental CT findings: Grossly stable sclerotic lesions within the thoracic spine, without hypermetabolic activity. Lower lumbar spondylosis and sacroiliac degenerative changes bilaterally.   IMPRESSION: 1. Stable PET-CT status post left nephrectomy. No evidence of local recurrence or hypermetabolic metastatic disease. 2. Stable incidental findings including coronary and Aortic Atherosclerosis (ICD10-I70.0). Pelvic floor laxity.     Electronically Signed   By: Carey Bullocks M.D.   On: 04/03/2021 12:30      ASSESSMENT and PLAN:   Audrey [name] is a 82 y.o. female who returns for a follow up for metastatic renal cell carcinoma.   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 -Rt adrenal gland bx - showed clear cell RCC -S/p  Cytoreductive left radical nephrectomy and left adrenal gland resection on 08/01/2016 by Dr Laverle Patter. -Started systemic therapy with Nivolumab 240 mg on 01/02/2018 and Xgeva on 02/27/2018.  -Most recent PET scan from 07/03/2022 revealed of progression or metastatic disease.   # Hypothyroidism/Adrenal insufficiency/Diabetes -Continue being followed by Dr. Sharl Ma   #H/o Choledocholithiasis and cholelithiasis -07/10/18 US Abdomen revealed Biliary duct dilatation with 8 mm calculus at the level of the ampulla in the distal common bile duct. 2. Cholelithiasis. No gallbladder wall thickening or pericholecystic fluid. 3. Appearance of the liver raises concern for underlying hepatic cirrhosis. No focal liver lesions are demonstrable. 4.  Left kidney absent. 5.  Small cysts in right kidney. 6.  Aortic Atherosclerosis. -S/p ERCP on 07/23/18 with Dr. Vicente Serene Mansouraty -09/02/18 Cytopathology from ERCP was suspicious for malignant cells in bile duct, but was not definitive, other two findings were benign.  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 --Sent referral to vascular surgery to determine if any further intervention outside of wound care is available.   PLAN:   -Due for next dose of Nivolumab treatment. -Reviewed labs from today that require no intervention. WBC 11.8, Hgb 14.94m Plt 279K. Stable creatinine at 1.37. LFTs normal.  -Proceed with treatment today without dose modifications -RTC in 2 weeks for labs and next treatment of Nivolumab.and 4 weeks for labs/follow up visit.   FOLLOW-UP: Per integrated scheduling   All of the patient's questions were answered with apparent satisfaction. The patient knows to call the clinic with any problems, questions or concerns.  I have spent a total of 30 minutes minutes of face-to-face and non-face-to-face time, preparing to see the patient,performing a medically appropriate examination, counseling and educating the  patient,referring and communicating with other health care professionals, documenting clinical information in the electronic health record, and care coordination.   Georga Kaufmann PA-C Dept of Hematology and Oncology [facility] Phone: [phone]

## 2022-07-31 NOTE — Patient Instructions (Signed)
[facility] [facility]  Discharge Instructions: Thank you for choosing [facility] to provide your oncology and hematology care.   If you have a lab appointment with the Cancer Center, please go directly to the Cancer Center and check in at the registration area.   Wear comfortable clothing and clothing appropriate for easy access to any Portacath or PICC line.   We strive to give you quality time with your provider. You may need to reschedule your appointment if you arrive late (15 or more minutes).  Arriving late affects you and other patients whose appointments are after yours.  Also, if you miss three or more appointments without notifying the office, you may be dismissed from the clinic at the provider's discretion.      For prescription refill requests, have your pharmacy contact our office and allow 72 hours for refills to be completed.    Today you received the following chemotherapy and/or immunotherapy agents: Opdivo      To help prevent nausea and vomiting after your treatment, we encourage you to take your nausea medication as directed.  BELOW ARE SYMPTOMS THAT SHOULD BE REPORTED IMMEDIATELY: *FEVER GREATER THAN 100.4 F (38 C) OR HIGHER *CHILLS OR SWEATING *NAUSEA AND VOMITING THAT IS NOT CONTROLLED WITH YOUR NAUSEA MEDICATION *UNUSUAL SHORTNESS OF BREATH *UNUSUAL BRUISING OR BLEEDING *URINARY PROBLEMS (pain or burning when urinating, or frequent urination) *BOWEL PROBLEMS (unusual diarrhea, constipation, pain near the anus) TENDERNESS IN MOUTH AND THROAT WITH OR WITHOUT PRESENCE OF ULCERS (sore throat, sores in mouth, or a toothache) UNUSUAL RASH, SWELLING OR PAIN  UNUSUAL VAGINAL DISCHARGE OR ITCHING   Items with * indicate a potential emergency and should be followed up as soon as possible or go to the Emergency Department if any problems should occur.  Please show the CHEMOTHERAPY ALERT CARD or IMMUNOTHERAPY ALERT CARD at check-in  to the Emergency Department and triage nurse.  Should you have questions after your visit or need to cancel or reschedule your appointment, please contact [facility]  Dept: [phone]  and follow the prompts.  Office hours are 8:00 a.m. to 4:30 p.m. Monday - Friday. Please note that voicemails left after 4:00 p.m. may not be returned until the following business day.  We are closed weekends and major holidays. You have access to a nurse at all times for urgent questions. Please call the main number to the clinic Dept: [phone] and follow the prompts.   For any non-urgent questions, you may also contact your provider using MyChart. We now offer e-Visits for anyone 18 and older to request care online for non-urgent symptoms. For details visit [email or web address].   Also download the MyChart app! Go to the app store, search "MyChart", open the app, select [facility] [place], and log in with your MyChart username and password.   

## 2022-08-02 ENCOUNTER — Other Ambulatory Visit: Payer: Self-pay

## 2022-08-04 ENCOUNTER — Other Ambulatory Visit: Payer: Self-pay

## 2022-08-07 ENCOUNTER — Other Ambulatory Visit: Payer: Self-pay

## 2022-08-14 ENCOUNTER — Other Ambulatory Visit: Payer: Medicare Other

## 2022-08-14 ENCOUNTER — Other Ambulatory Visit: Payer: Self-pay

## 2022-08-14 ENCOUNTER — Inpatient Hospital Stay: Payer: Medicare Other

## 2022-08-14 ENCOUNTER — Ambulatory Visit: Payer: Medicare Other

## 2022-08-14 VITALS — BP 132/64 | HR 72 | Temp 98.2°F | Resp 18 | Wt 117.7 lb

## 2022-08-14 DIAGNOSIS — C642 Malignant neoplasm of left kidney, except renal pelvis: Secondary | ICD-10-CM

## 2022-08-14 DIAGNOSIS — C7951 Secondary malignant neoplasm of bone: Secondary | ICD-10-CM

## 2022-08-14 DIAGNOSIS — Z5112 Encounter for antineoplastic immunotherapy: Secondary | ICD-10-CM | POA: Diagnosis not present

## 2022-08-14 DIAGNOSIS — Z95828 Presence of other vascular implants and grafts: Secondary | ICD-10-CM

## 2022-08-14 DIAGNOSIS — I739 Peripheral vascular disease, unspecified: Secondary | ICD-10-CM

## 2022-08-14 LAB — CMP (CANCER CENTER ONLY)
ALT: 84 U/L — ABNORMAL HIGH (ref 0–44)
AST: 32 U/L (ref 15–41)
Albumin: 3.8 g/dL (ref 3.5–5.0)
Alkaline Phosphatase: 63 U/L (ref 38–126)
Anion gap: 8 (ref 5–15)
BUN: 37 mg/dL — ABNORMAL HIGH (ref 8–23)
CO2: 22 mmol/L (ref 22–32)
Calcium: 10.1 mg/dL (ref 8.9–10.3)
Chloride: 108 mmol/L (ref 98–111)
Creatinine: 1.58 mg/dL — ABNORMAL HIGH (ref 0.44–1.00)
GFR, Estimated: 33 mL/min — ABNORMAL LOW (ref >60)
Glucose, Bld: 135 mg/dL — ABNORMAL HIGH (ref 70–99)
Potassium: 4.2 mmol/L (ref 3.5–5.1)
Sodium: 138 mmol/L (ref 135–145)
Total Bilirubin: 0.6 mg/dL (ref 0.3–1.2)
Total Protein: 6.6 g/dL (ref 6.5–8.1)

## 2022-08-14 LAB — CBC WITH DIFFERENTIAL (CANCER CENTER ONLY)
Abs Immature Granulocytes: 0.16 10*3/uL — ABNORMAL HIGH (ref 0.00–0.07)
Basophils Absolute: 0.1 10*3/uL (ref 0.0–0.1)
Basophils Relative: 1 %
Eosinophils Absolute: 0.8 10*3/uL — ABNORMAL HIGH (ref 0.0–0.5)
Eosinophils Relative: 7 %
HCT: 40.8 % (ref 36.0–46.0)
Hemoglobin: 13.4 g/dL (ref 12.0–15.0)
Immature Granulocytes: 2 %
Lymphocytes Relative: 12 %
Lymphs Abs: 1.3 10*3/uL (ref 0.7–4.0)
MCH: 29.2 pg (ref 26.0–34.0)
MCHC: 32.8 g/dL (ref 30.0–36.0)
MCV: 88.9 fL (ref 80.0–100.0)
Monocytes Absolute: 1.2 10*3/uL — ABNORMAL HIGH (ref 0.1–1.0)
Monocytes Relative: 11 %
Neutro Abs: 7.3 10*3/uL (ref 1.7–7.7)
Neutrophils Relative %: 67 %
Platelet Count: 313 10*3/uL (ref 150–400)
RBC: 4.59 MIL/uL (ref 3.87–5.11)
RDW: 14 % (ref 11.5–15.5)
WBC Count: 10.8 10*3/uL — ABNORMAL HIGH (ref 4.0–10.5)
nRBC: 0 % (ref 0.0–0.2)

## 2022-08-14 LAB — TSH: TSH: 2.779 u[IU]/mL (ref 0.350–4.500)

## 2022-08-14 MED ORDER — SODIUM CHLORIDE 0.9 % IV SOLN
240.0000 mg | Freq: Once | INTRAVENOUS | Status: AC
  Administered 2022-08-14: 240 mg via INTRAVENOUS
  Filled 2022-08-14: qty 24

## 2022-08-14 MED ORDER — DENOSUMAB 120 MG/1.7ML ~~LOC~~ SOLN
120.0000 mg | Freq: Once | SUBCUTANEOUS | Status: AC
  Administered 2022-08-14: 120 mg via SUBCUTANEOUS
  Filled 2022-08-14: qty 1.7

## 2022-08-14 MED ORDER — SODIUM CHLORIDE 0.9% FLUSH
10.0000 mL | Freq: Once | INTRAVENOUS | Status: AC
  Administered 2022-08-14: 10 mL

## 2022-08-14 MED ORDER — SODIUM CHLORIDE 0.9 % IV SOLN: Freq: Once | INTRAVENOUS | Status: AC

## 2022-08-14 NOTE — Patient Instructions (Signed)
[facility]  Discharge Instructions: Thank you for choosing [facility] to provide your oncology and hematology care.   If you have a lab appointment with the Cancer Center, please go directly to the Cancer Center and check in at the registration area.   Wear comfortable clothing and clothing appropriate for easy access to any Portacath or PICC line.   We strive to give you quality time with your provider. You may need to reschedule your appointment if you arrive late (15 or more minutes).  Arriving late affects you and other patients whose appointments are after yours.  Also, if you miss three or more appointments without notifying the office, you may be dismissed from the clinic at the provider's discretion.      For prescription refill requests, have your pharmacy contact our office and allow 72 hours for refills to be completed.    Today you received the following chemotherapy and/or immunotherapy agents: Opdivo      To help prevent nausea and vomiting after your treatment, we encourage you to take your nausea medication as directed.  BELOW ARE SYMPTOMS THAT SHOULD BE REPORTED IMMEDIATELY: *FEVER GREATER THAN 100.4 F (38 C) OR HIGHER *CHILLS OR SWEATING *NAUSEA AND VOMITING THAT IS NOT CONTROLLED WITH YOUR NAUSEA MEDICATION *UNUSUAL SHORTNESS OF BREATH *UNUSUAL BRUISING OR BLEEDING *URINARY PROBLEMS (pain or burning when urinating, or frequent urination) *BOWEL PROBLEMS (unusual diarrhea, constipation, pain near the anus) TENDERNESS IN MOUTH AND THROAT WITH OR WITHOUT PRESENCE OF ULCERS (sore throat, sores in mouth, or a toothache) UNUSUAL RASH, SWELLING OR PAIN  UNUSUAL VAGINAL DISCHARGE OR ITCHING   Items with * indicate a potential emergency and should be followed up as soon as possible or go to the Emergency Department if any problems should occur.  Please show the CHEMOTHERAPY ALERT CARD or IMMUNOTHERAPY ALERT CARD at check-in  to the Emergency Department and triage nurse.  Should you have questions after your visit or need to cancel or reschedule your appointment, please contact [facility]  Dept: [phone]  and follow the prompts.  Office hours are 8:00 a.m. to 4:30 p.m. Monday - Friday. Please note that voicemails left after 4:00 p.m. may not be returned until the following business day.  We are closed weekends and major holidays. You have access to a nurse at all times for urgent questions. Please call the main number to the clinic Dept: [phone] and follow the prompts.   For any non-urgent questions, you may also contact your provider using MyChart. We now offer e-Visits for anyone 18 and older to request care online for non-urgent symptoms. For details visit [email or web address].   Also download the MyChart app! Go to the app store, search "MyChart", open the app, select [place], and log in with your MyChart username and password.   

## 2022-08-14 NOTE — Progress Notes (Signed)
Per Dr Candise Che pt ok to tx with CR: 1.58, ALT 84

## 2022-08-16 LAB — T4: T4, Total: 10.1 ug/dL (ref 4.5–12.0)

## 2022-08-28 ENCOUNTER — Inpatient Hospital Stay (HOSPITAL_BASED_OUTPATIENT_CLINIC_OR_DEPARTMENT_OTHER): Payer: Medicare Other | Admitting: Hematology

## 2022-08-28 ENCOUNTER — Encounter: Payer: Self-pay | Admitting: Hematology

## 2022-08-28 ENCOUNTER — Ambulatory Visit: Payer: Medicare Other | Admitting: Hematology

## 2022-08-28 ENCOUNTER — Ambulatory Visit: Payer: Medicare Other

## 2022-08-28 ENCOUNTER — Inpatient Hospital Stay: Payer: Medicare Other

## 2022-08-28 ENCOUNTER — Other Ambulatory Visit: Payer: Medicare Other

## 2022-08-28 ENCOUNTER — Telehealth: Payer: Self-pay | Admitting: Hematology

## 2022-08-28 ENCOUNTER — Inpatient Hospital Stay: Payer: Medicare Other | Attending: Hematology

## 2022-08-28 VITALS — BP 142/50 | HR 51 | Temp 97.6°F | Resp 17 | Wt 120.5 lb

## 2022-08-28 DIAGNOSIS — Z5112 Encounter for antineoplastic immunotherapy: Secondary | ICD-10-CM | POA: Diagnosis not present

## 2022-08-28 DIAGNOSIS — Z87891 Personal history of nicotine dependence: Secondary | ICD-10-CM | POA: Insufficient documentation

## 2022-08-28 DIAGNOSIS — N189 Chronic kidney disease, unspecified: Secondary | ICD-10-CM | POA: Insufficient documentation

## 2022-08-28 DIAGNOSIS — C7951 Secondary malignant neoplasm of bone: Secondary | ICD-10-CM | POA: Diagnosis not present

## 2022-08-28 DIAGNOSIS — E1136 Type 2 diabetes mellitus with diabetic cataract: Secondary | ICD-10-CM | POA: Insufficient documentation

## 2022-08-28 DIAGNOSIS — I700 Atherosclerosis of aorta: Secondary | ICD-10-CM | POA: Diagnosis not present

## 2022-08-28 DIAGNOSIS — E119 Type 2 diabetes mellitus without complications: Secondary | ICD-10-CM | POA: Diagnosis not present

## 2022-08-28 DIAGNOSIS — Z95828 Presence of other vascular implants and grafts: Secondary | ICD-10-CM

## 2022-08-28 DIAGNOSIS — I129 Hypertensive chronic kidney disease with stage 1 through stage 4 chronic kidney disease, or unspecified chronic kidney disease: Secondary | ICD-10-CM | POA: Diagnosis not present

## 2022-08-28 DIAGNOSIS — C642 Malignant neoplasm of left kidney, except renal pelvis: Secondary | ICD-10-CM

## 2022-08-28 DIAGNOSIS — E785 Hyperlipidemia, unspecified: Secondary | ICD-10-CM | POA: Insufficient documentation

## 2022-08-28 DIAGNOSIS — E039 Hypothyroidism, unspecified: Secondary | ICD-10-CM | POA: Insufficient documentation

## 2022-08-28 DIAGNOSIS — I2510 Atherosclerotic heart disease of native coronary artery without angina pectoris: Secondary | ICD-10-CM | POA: Insufficient documentation

## 2022-08-28 DIAGNOSIS — E1122 Type 2 diabetes mellitus with diabetic chronic kidney disease: Secondary | ICD-10-CM | POA: Diagnosis not present

## 2022-08-28 DIAGNOSIS — N281 Cyst of kidney, acquired: Secondary | ICD-10-CM | POA: Insufficient documentation

## 2022-08-28 DIAGNOSIS — I252 Old myocardial infarction: Secondary | ICD-10-CM | POA: Insufficient documentation

## 2022-08-28 DIAGNOSIS — R600 Localized edema: Secondary | ICD-10-CM | POA: Insufficient documentation

## 2022-08-28 DIAGNOSIS — K219 Gastro-esophageal reflux disease without esophagitis: Secondary | ICD-10-CM | POA: Insufficient documentation

## 2022-08-28 LAB — CBC WITH DIFFERENTIAL (CANCER CENTER ONLY)
Abs Immature Granulocytes: 0.11 10*3/uL — ABNORMAL HIGH (ref 0.00–0.07)
Basophils Absolute: 0.1 10*3/uL (ref 0.0–0.1)
Basophils Relative: 1 %
Eosinophils Absolute: 0 10*3/uL (ref 0.0–0.5)
Eosinophils Relative: 0 %
HCT: 40.1 % (ref 36.0–46.0)
Hemoglobin: 12.8 g/dL (ref 12.0–15.0)
Immature Granulocytes: 1 %
Lymphocytes Relative: 9 %
Lymphs Abs: 0.8 10*3/uL (ref 0.7–4.0)
MCH: 28.9 pg (ref 26.0–34.0)
MCHC: 31.9 g/dL (ref 30.0–36.0)
MCV: 90.5 fL (ref 80.0–100.0)
Monocytes Absolute: 0.8 10*3/uL (ref 0.1–1.0)
Monocytes Relative: 9 %
Neutro Abs: 7.2 10*3/uL (ref 1.7–7.7)
Neutrophils Relative %: 80 %
Platelet Count: 263 10*3/uL (ref 150–400)
RBC: 4.43 MIL/uL (ref 3.87–5.11)
RDW: 14.6 % (ref 11.5–15.5)
WBC Count: 8.9 10*3/uL (ref 4.0–10.5)
nRBC: 0 % (ref 0.0–0.2)

## 2022-08-28 LAB — CMP (CANCER CENTER ONLY)
ALT: 26 U/L (ref 0–44)
AST: 16 U/L (ref 15–41)
Albumin: 3.8 g/dL (ref 3.5–5.0)
Alkaline Phosphatase: 63 U/L (ref 38–126)
Anion gap: 4 — ABNORMAL LOW (ref 5–15)
BUN: 35 mg/dL — ABNORMAL HIGH (ref 8–23)
CO2: 20 mmol/L — ABNORMAL LOW (ref 22–32)
Calcium: 7.9 mg/dL — ABNORMAL LOW (ref 8.9–10.3)
Chloride: 115 mmol/L — ABNORMAL HIGH (ref 98–111)
Creatinine: 1.37 mg/dL — ABNORMAL HIGH (ref 0.44–1.00)
GFR, Estimated: 39 mL/min — ABNORMAL LOW (ref >60)
Glucose, Bld: 201 mg/dL — ABNORMAL HIGH (ref 70–99)
Potassium: 5 mmol/L (ref 3.5–5.1)
Sodium: 139 mmol/L (ref 135–145)
Total Bilirubin: 0.4 mg/dL (ref 0.3–1.2)
Total Protein: 6.2 g/dL — ABNORMAL LOW (ref 6.5–8.1)

## 2022-08-28 LAB — TSH: TSH: 0.408 u[IU]/mL (ref 0.350–4.500)

## 2022-08-28 MED ORDER — SODIUM CHLORIDE 0.9 % IV SOLN: Freq: Once | INTRAVENOUS | Status: AC

## 2022-08-28 MED ORDER — SODIUM CHLORIDE 0.9% FLUSH
10.0000 mL | Freq: Once | INTRAVENOUS | Status: AC
  Administered 2022-08-28: 10 mL

## 2022-08-28 MED ORDER — SODIUM CHLORIDE 0.9% FLUSH
10.0000 mL | INTRAVENOUS | Status: DC | PRN
  Administered 2022-08-28: 10 mL

## 2022-08-28 MED ORDER — HEPARIN SOD (PORK) LOCK FLUSH 100 UNIT/ML IV SOLN
500.0000 [IU] | Freq: Once | INTRAVENOUS | Status: AC | PRN
  Administered 2022-08-28: 500 [IU]

## 2022-08-28 MED ORDER — SODIUM CHLORIDE 0.9 % IV SOLN
240.0000 mg | Freq: Once | INTRAVENOUS | Status: AC
  Administered 2022-08-28: 240 mg via INTRAVENOUS
  Filled 2022-08-28: qty 24

## 2022-08-28 NOTE — Progress Notes (Signed)
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  SUMMARY OF ONCOLOGIC HISTORY: Oncology History  Cancer, 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HCC)  01/10/2017 Initial Diagnosis   Cancer, 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HCC)   01/02/2018 - 01/16/2022 Chemotherapy   Patient is on Treatment Plan : RENAL CELL CARCINOMA NIVOLUMAB Q14D     01/22/2022 Cancer Staging   Staging form: Bone - Appendicular Skeleton, Trunk, Skull, and Facial Bones, AJCC 8th Edition - Clinical: Stage IVB (pM1b) - Signed by Johney Maine, MD on 01/22/2022   02/01/2022 -  Chemotherapy   Patient is on Treatment Plan : RENAL CELL CARCINOMA Nivolumab (240) q14d/     Renal cell carcinoma, left (HCC)  04/11/2017 Initial Diagnosis   Renal cell carcinoma (HCC)   01/02/2018 - 01/16/2022 Chemotherapy   The patient had nivolumab (OPDIVO) 240 mg in sodium chloride 0.9 % 100 mL chemo infusion, 240 mg, Intravenous, Once, 47 of 50 cycles Administration: 240 mg (01/02/2018), 240 mg (01/15/2018), 240 mg (01/30/2018), 240 mg (02/13/2018), 240 mg (02/27/2018), 240 mg (03/13/2018), 240 mg (03/27/2018), 240 mg (04/10/2018), 240 mg (04/24/2018), 240 mg (05/08/2018), 240 mg (05/22/2018), 240 mg (06/05/2018), 240 mg (06/19/2018), 240 mg (08/28/2018), 240 mg (09/11/2018), 240 mg (10/09/2018), 240 mg (10/22/2018), 240 mg (11/06/2018), 240 mg (11/20/2018), 240 mg (12/04/2018), 240 mg (12/18/2018), 240 mg (01/01/2019), 240 mg (01/15/2019), 240 mg (01/29/2019), 240 mg (02/12/2019), 240 mg (02/26/2019), 240 mg (03/12/2019), 240 mg (03/26/2019), 240 mg (04/09/2019), 240 mg (04/28/2019), 240 mg (05/12/2019), 240 mg (05/27/2019), 240 mg (06/09/2019), 240 mg (06/23/2019), 240 mg (07/07/2019), 240 mg (07/21/2019), 240 mg (08/04/2019), 240 mg (08/18/2019), 240 mg (09/03/2019), 240 mg (09/15/2019), 240 mg (09/29/2019), 240 mg (10/12/2019), 240 mg (10/27/2019), 240 mg  (11/10/2019), 240 mg (12/08/2019), 240 mg (12/22/2019), 240 mg (01/19/2020)  for chemotherapy treatment.    02/01/2022 -  Chemotherapy   Patient is on Treatment Plan : RENAL CELL CARCINOMA Nivolumab (240) q14d/       CURRENT THERAPY: metastatic renal cell carcinoma, Rivka Barbara and Nivolumab  INTERVAL HISTORY:  Nyanza Kindschi Przybylski 81 y.o. female is here for continued evaluation and management of metastatic renal cell carcinoma. She is here to continue her Xgeva and Nivolumab.  Patient was seen by me on 07/03/2022 and reported pain in her right toe wound, right ankle edema, stable tongue discomfort, and worsened mouth sore on left cheek.  Today, she is accompanied by her friend and reports that she is taking Prednisone patches. She reports having abdominal pain last week, which has since improved. She reports being newly-diagnosed with Scoliosis.  Patient Active Problem List   Diagnosis Date Noted   Atrophy of thyroid (acquired) 01/15/2022   At risk for falls 01/15/2022   CKD (chronic kidney disease) 01/15/2022   Diabetic ulcer of foot with fat layer exposed (HCC) 01/15/2022   Dizziness 01/15/2022   Dysuria 01/15/2022   Knee osteoarthritis 01/15/2022   Pulmonary nodule 01/15/2022   Type 2 diabetes mellitus (HCC) 08/15/2021   Incisional hernia 06/23/2020   Gastritis and gastroduodenitis 02/26/2020   Rectal discomfort 02/26/2020   History of colonic polyps 02/26/2020   Ventral hernia 11/10/2019   History of cholecystectomy 07/17/2019   Degeneration of lumbar intervertebral disc 11/12/2018   Anemia 10/10/2018   Chronic cholecystitis 09/04/2018   Acute cholecystitis 08/29/2018   Biliary stricture 08/23/2018   Medicare annual wellness visit, subsequent 08/14/2018  CAD (coronary artery disease) 08/14/2018   Dyslipidemia 08/14/2018   Abnormal LFTs 08/05/2018   Choledocholithiasis 08/05/2018   Dilated bile duct 08/05/2018   History of ERCP 08/05/2018   History of renal cell carcinoma 08/05/2018    Port-A-Cath in place 01/30/2018   Counseling regarding advance care planning and goals of care 01/04/2018   Renal cell carcinoma, left (HCC) 04/11/2017   Cancer, 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HCC) 01/10/2017   Left renal mass 08/01/2016   Hyperlipidemia 05/23/2015   Essential hypertension 05/23/2015   Hypothyroidism 05/23/2015    is allergic to doxycycline, adhesive [tape], duloxetine, gabapentin (once-daily), nsaids, and statins.  MEDICAL HISTORY: Past Medical History:  Diagnosis Date   Anemia    Arthritis    lower back, hips, hands   Biliary stricture    Diabetes mellitus (HCC)    type 2    Early cataracts, bilateral    Md just watching   Elevated liver enzymes    Family history of adverse reaction to anesthesia    Daughter hard to wake up   Gallstones    GERD (gastroesophageal reflux disease)    occasional - diet controlled   History of blood transfusion 2018   History of hiatal hernia    HTN (hypertension)    Hyperlipidemia    Hypothyroidism    left renal ca dx'd 2018   renal cancer - left kidney removed, pill chemo x 1 yr   Myocardial infarction (HCC) 1991   no deficits   SVD (spontaneous vaginal delivery)    x 3   Wears glasses     SURGICAL HISTORY: Past Surgical History:  Procedure Laterality Date   BALLOON DILATION N/A 07/23/2018   Procedure: BALLOON DILATION;  Surgeon: Mansouraty, Netty Starring., MD;  Location[facility]COPY;  Service: Gastroenterology;  Laterality: N/A;   BILIARY BRUSHING  08/10/2018   Procedure: BILIARY BRUSHING;  Surgeon: Meridee Score Netty Starring., MD;  Location: [facility] ENDOSCOPY;  Service: Gastroenterology;;   BILIARY BRUSHING  11/30/2018   Procedure: BILIARY BRUSHING;  Surgeon: Lemar Lofty., MD;  Location: [facility] ENDOSCOPY;  Service: Gastroenterology;;   BILIARY DILATION  08/10/2018   Procedure: BILIARY DILATION;  Surgeon: Lemar Lofty., MD;  Location: [facility] ENDOSCOPY;  Service: Gastroenterology;;   BILIARY DILATION  08/27/2018   Procedure:  BILIARY DILATION;  Surgeon: Lemar Lofty., MD;  Location: [facility] ENDOSCOPY;  Service: Gastroenterology;;   BILIARY DILATION  01/24/2020   Procedure: BILIARY DILATION;  Surgeon: Lemar Lofty., MD;  Location: [facility] ENDOSCOPY;  Service: Gastroenterology;;   BILIARY STENT PLACEMENT  08/10/2018   Procedure: BILIARY STENT PLACEMENT;  Surgeon: Lemar Lofty., MD;  Location: [facility] ENDOSCOPY;  Service: Gastroenterology;;   BILIARY STENT PLACEMENT  08/27/2018   Procedure: BILIARY STENT PLACEMENT;  Surgeon: Lemar Lofty., MD;  Location: [facility] ENDOSCOPY;  Service: Gastroenterology;;   BILIARY STENT PLACEMENT  11/30/2018   Procedure: BILIARY STENT PLACEMENT;  Surgeon: Lemar Lofty., MD;  Location: [facility] ENDOSCOPY;  Service: Gastroenterology;;   BIOPSY  07/23/2018   Procedure: BIOPSY;  Surgeon: Lemar Lofty., MD;  Location: [facility] ENDOSCOPY;  Service: Gastroenterology;;   BIOPSY  01/24/2020   Procedure: BIOPSY;  Surgeon: Lemar Lofty., MD;  Location: [facility] ENDOSCOPY;  Service: Gastroenterology;;   CHOLECYSTECTOMY N/A 09/02/2018   Procedure: LAPAROSCOPIC CHOLECYSTECTOMY;  Surgeon: Almond Lint, MD;  Location: [facility] OR;  Service: General;  Laterality: N/A;   COLONOSCOPY     normal    ENDOSCOPIC RETROGRADE CHOLANGIOPANCREATOGRAPHY (ERCP) WITH PROPOFOL N/A 08/10/2018   Procedure: ENDOSCOPIC  RETROGRADE CHOLANGIOPANCREATOGRAPHY (ERCP) WITH PROPOFOL;  Surgeon: Mansouraty, Netty Starring., MD;  Location: [facility] ENDOSCOPY;  Service: Gastroenterology;  Laterality: N/A;   ENDOSCOPIC RETROGRADE CHOLANGIOPANCREATOGRAPHY (ERCP) WITH PROPOFOL N/A 11/30/2018   Procedure: ENDOSCOPIC RETROGRADE CHOLANGIOPANCREATOGRAPHY (ERCP) WITH PROPOFOL;  Surgeon: Meridee Score Netty Starring., MD;  Location: [facility] ENDOSCOPY;  Service: Gastroenterology;  Laterality: N/A;   ENDOSCOPIC RETROGRADE CHOLANGIOPANCREATOGRAPHY (ERCP) WITH PROPOFOL N/A 01/24/2020   Procedure: ENDOSCOPIC RETROGRADE CHOLANGIOPANCREATOGRAPHY (ERCP) WITH  PROPOFOL;  Surgeon: Meridee Score Netty Starring., MD;  Location: [facility] ENDOSCOPY;  Service: Gastroenterology;  Laterality: N/A;   ERCP N/A 07/23/2018   Procedure: ENDOSCOPIC RETROGRADE CHOLANGIOPANCREATOGRAPHY (ERCP);  Surgeon: Lemar Lofty., MD;  Location: [facility] ENDOSCOPY;  Service: Gastroenterology;  Laterality: N/A;   ERCP N/A 08/27/2018   Procedure: ENDOSCOPIC RETROGRADE CHOLANGIOPANCREATOGRAPHY (ERCP);  Surgeon: Lemar Lofty., MD;  Location: [facility] ENDOSCOPY;  Service: Gastroenterology;  Laterality: N/A;   ESOPHAGOGASTRODUODENOSCOPY N/A 01/24/2020   Procedure: ESOPHAGOGASTRODUODENOSCOPY (EGD);  Surgeon: Lemar Lofty., MD;  Location: [facility] ENDOSCOPY;  Service: Gastroenterology;  Laterality: N/A;   ESOPHAGOGASTRODUODENOSCOPY (EGD) WITH PROPOFOL N/A 08/27/2018   Procedure: ESOPHAGOGASTRODUODENOSCOPY (EGD) WITH PROPOFOL;  Surgeon: Meridee Score Netty Starring., MD;  Location: [facility] ENDOSCOPY;  Service: Gastroenterology;  Laterality: N/A;   EUS N/A 08/27/2018   Procedure: ESOPHAGEAL ENDOSCOPIC ULTRASOUND (EUS) RADIAL;  Surgeon: Meridee Score Netty Starring., MD;  Location: [facility] ENDOSCOPY;  Service: Gastroenterology;  Laterality: N/A;   FINE NEEDLE ASPIRATION  08/27/2018   Procedure: FINE NEEDLE ASPIRATION (FNA) LINEAR;  Surgeon: Lemar Lofty., MD;  Location: [facility] ENDOSCOPY;  Service: Gastroenterology;;   Sherald Hess HERNIA REPAIR N/A 06/23/2020   Procedure: OPEN INCISIONAL HERNIA REPAIR WITH MESH;  Surgeon: Quentin Ore, MD;  Location: [facility] ORS;  Service: General;  Laterality: N/A;  ROOM 2 STARTING AT 11:00AM FOR 90 MIN   IR IMAGING GUIDED PORT INSERTION  01/08/2018   LAPAROSCOPIC NEPHRECTOMY Left 08/01/2016   Procedure: LAPAROSCOPIC  RADICAL NEPHRECTOMY/ REPAIR OF UMBILICAL HERNIA;  Surgeon: Heloise Purpura, MD;  Location: [facility] ORS;  Service: Urology;  Laterality: Left;   REMOVAL OF STONES  07/23/2018   Procedure: REMOVAL OF GALL STONES;  Surgeon: Meridee Score Netty Starring., MD;  Location: [facility] ENDOSCOPY;   Service: Gastroenterology;;   REMOVAL OF STONES  08/10/2018   Procedure: REMOVAL OF STONES;  Surgeon: Lemar Lofty., MD;  Location: [facility] ENDOSCOPY;  Service: Gastroenterology;;   REMOVAL OF STONES  08/27/2018   Procedure: REMOVAL OF STONES;  Surgeon: Lemar Lofty., MD;  Location: [facility] ENDOSCOPY;  Service: Gastroenterology;;   REMOVAL OF STONES  01/24/2020   Procedure: REMOVAL OF STONES;  Surgeon: Lemar Lofty., MD;  Location: [facility] ENDOSCOPY;  Service: Gastroenterology;;   Dennison Mascot  07/23/2018   Procedure: Dennison Mascot;  Surgeon: Lemar Lofty., MD;  Location: [facility] ENDOSCOPY;  Service: Gastroenterology;;   Francine Graven REMOVAL  08/27/2018   Procedure: STENT REMOVAL;  Surgeon: Lemar Lofty., MD;  Location: [facility] ENDOSCOPY;  Service: Gastroenterology;;   Francine Graven REMOVAL  11/30/2018   Procedure: STENT REMOVAL;  Surgeon: Lemar Lofty., MD;  Location: [facility] ENDOSCOPY;  Service: Gastroenterology;;   Francine Graven REMOVAL  01/24/2020   Procedure: STENT REMOVAL;  Surgeon: Lemar Lofty., MD;  Location: [facility] ENDOSCOPY;  Service: Gastroenterology;;   UPPER GI ENDOSCOPY     x 1   VAGINAL PROLAPSE REPAIR  11/18/2019   [facility]    SOCIAL HISTORY: Social History   Socioeconomic History   Marital status: Widowed    Spouse name: Not on file   Number of children: 3   Years of  education: Not on file   Highest education level: Not on file  Occupational History   Occupation: retired  Tobacco Use   Smoking status: Former    Packs/day: 1.00    Years: 8.00    Additional pack years: 0.00    Total pack years: 8.00    Types: Cigarettes    Quit date: 06/18/1964    Years since quitting: 58.2   Smokeless tobacco: Never  Vaping Use   Vaping Use: Never used  Substance and Sexual Activity   Alcohol use: No   Drug use: No   Sexual activity: Not Currently    Birth control/protection: Post-menopausal  Other Topics Concern   Not on file  Social History Narrative    Married.  Three children   Social Determinants of Health   Financial Resource Strain: Low Risk  (05/23/2022)   Overall Financial Resource Strain (CARDIA)    Difficulty of Paying Living Expenses: Not hard at all  Food Insecurity: No Food Insecurity (05/23/2022)   Hunger Vital Sign    Worried About Running Out of Food in the Last Year: Never true    Ran Out of Food in the Last Year: Never true  Transportation Needs: No Transportation Needs (05/23/2022)   PRAPARE - Administrator, Civil Service (Medical): No    Lack of Transportation (Non-Medical): No  Physical Activity: Not on file  Stress: Not on file  Social Connections: Not on file  Intimate Partner Violence: Not At Risk (05/23/2022)   Humiliation, Afraid, Rape, and Kick questionnaire    Fear of Current or Ex-Partner: No    Emotionally Abused: No    Physically Abused: No    Sexually Abused: No    FAMILY HISTORY: Family History  Problem Relation Age of Onset   Heart attack Father 47   Heart disease Brother 4       CABG   Colon cancer Neg Hx    Esophageal cancer Neg Hx    Inflammatory bowel disease Neg Hx    Liver disease Neg Hx    Pancreatic cancer Neg Hx    Rectal cancer Neg Hx    Stomach cancer Neg Hx      ROS  10 Point review of Systems was done is negative except as noted above.   PHYSICAL EXAMINATION Vital signs reviewed GENERAL:alert, in no acute distress and comfortable SKIN: no acute rashes, no significant lesions EYES: conjunctiva are pink and non-injected, sclera anicteric OROPHARYNX: MMM, no exudates, no oropharyngeal erythema or ulceration NECK: supple, no JVD LYMPH:  no palpable lymphadenopathy in the cervical, axillary or inguinal regions LUNGS: clear to auscultation b/l with normal respiratory effort HEART: regular rate & rhythm ABDOMEN:  normoactive bowel sounds , non tender, not distended. Extremity: no pedal edema PSYCH: alert & oriented x 3 with fluent speech NEURO: no focal  motor/sensory deficits   LABORATORY DATA: .    Latest Ref Rng & Units 08/28/2022    8:02 AM 08/14/2022    8:19 AM 07/31/2022   12:45 PM  CBC  WBC 4.0 - 10.5 K/uL 8.9  10.8  11.8   Hemoglobin 12.0 - 15.0 g/dL 32.4  40.1  02.7   Hematocrit 36.0 - 46.0 % 40.1  40.8  41.4   Platelets 150 - 400 K/uL 263  313  279    .    Latest Ref Rng & Units 08/28/2022    8:02 AM 08/14/2022    8:19 AM 07/31/2022   12:45 PM  CMP  Glucose 70 - 99 mg/dL 161  096  045   BUN 8 - 23 mg/dL 35  37  24   Creatinine 0.44 - 1.00 mg/dL 4.09  8.11  9.14   Sodium 135 - 145 mmol/L 139  138  140   Potassium 3.5 - 5.1 mmol/L 5.0  4.2  3.6   Chloride 98 - 111 mmol/L 115  108  113   CO2 22 - 32 mmol/L 20  22  23    Calcium 8.9 - 10.3 mg/dL 7.9  78.2  9.0   Total Protein 6.5 - 8.1 g/dL 6.2  6.6  6.3   Total Bilirubin 0.3 - 1.2 mg/dL 0.4  0.6  0.4   Alkaline Phos 38 - 126 U/L 63  63  67   AST 15 - 41 U/L 16  32  25   ALT 0 - 44 U/L 26  84  39    RADIOLOGY    CLINICAL DATA:  Subsequent treatment strategy for metastatic renal cell carcinoma.   EXAM: NUCLEAR MEDICINE PET SKULL BASE TO THIGH   TECHNIQUE: 6.32 mCi F-18 FDG was injected intravenously. Full-ring PET imaging was performed from the skull base to thigh after the radiotracer. CT data was obtained and used for attenuation correction and anatomic localization.   Fasting blood glucose: 159 mg/dl   COMPARISON:  PET-CT 95/62/1308 and 02/09/2020.   FINDINGS: Mediastinal blood pool activity: SUV max 2.7   NECK:   No hypermetabolic cervical lymph nodes are identified.There are no lesions of the pharyngeal mucosal space. Activity associated with the muscles of phonation is within physiologic limits and similar to previous study.   Incidental CT findings: Bilateral carotid atherosclerosis.   CHEST:   There are no hypermetabolic mediastinal, hilar or axillary lymph nodes. No hypermetabolic pulmonary activity or suspicious nodularity.   Incidental  CT findings: Right IJ Port-A-Cath extends to the superior cavoatrial junction. There is diffuse atherosclerosis of the aorta, great vessels and coronary arteries. Mild linear scarring at both lung bases appears unchanged.   ABDOMEN/PELVIS:   There is no hypermetabolic activity within the liver, adrenal glands, spleen or pancreas. There is no hypermetabolic nodal activity. Stable appearance of the right kidney. No hypermetabolic activity within the left nephrectomy bed   Incidental CT findings: Diffuse aortic and branch vessel atherosclerosis, scattered colonic diverticulosis and pelvic floor laxity are again noted.   SKELETON:   There is no hypermetabolic activity to suggest osseous metastatic disease.   Incidental CT findings: Grossly stable sclerotic lesions within the thoracic spine, without hypermetabolic activity. Lower lumbar spondylosis and sacroiliac degenerative changes bilaterally.   IMPRESSION: 1. Stable PET-CT status post left nephrectomy. No evidence of local recurrence or hypermetabolic metastatic disease. 2. Stable incidental findings including coronary and Aortic Atherosclerosis (ICD10-I70.0). Pelvic floor laxity.     Electronically Signed   By: Carey Bullocks M.D.   On: 04/03/2021 12:30      ASSESSMENT and PLAN:   Audrey [name] is a 82 y.o. female who returns for a follow up for metastatic renal cell carcinoma.   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 -Rt adrenal gland bx - showed clear cell RCC -S/p Cytoreductive left radical nephrectomy and left adrenal gland resection on 08/01/2016 by Dr Laverle Patter. -Started systemic therapy with Nivolumab 240 mg on 01/02/2018 and Xgeva on 02/27/2018.  -Most recent PET scan from 11/09/2021 showed no clear evidence of new or progressive  disease.   # Hypothyroidism/Adrenal insufficiency/Diabetes -Continue being followed  by Dr. Sharl Ma   #H/o Choledocholithiasis and cholelithiasis -07/10/18 US Abdomen revealed Biliary duct dilatation with 8 mm calculus at the level of the ampulla in the distal common bile duct. 2. Cholelithiasis. No gallbladder wall thickening or pericholecystic fluid. 3. Appearance of the liver raises concern for underlying hepatic cirrhosis. No focal liver lesions are demonstrable. 4.  Left kidney absent. 5.  Small cysts in right kidney. 6.  Aortic Atherosclerosis. -S/p ERCP on 07/23/18 with Dr. Vicente Serene Mansouraty -09/02/18 Cytopathology from ERCP was suspicious for malignant cells in bile duct, but was not definitive, other two findings were benign.  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 --Recent completed course of cephalexin therapy approximately one week ago --Recommend follow up with wound care. Sent another round of abx therapy with Augmentin as patient is unable to tolerate doxycycline and has D2D interaction with bactrim. Trying to avoid clindamycin due to age and risk of C.diff.   PLAN:    -Discussed lab results on 08/28/2022 in detail with patient. CBC normal showed WBC of 8.9K, hemoglobin of 12.8, and platelets of 263K. -calcium levels low at 7.9 -Liver enzymes previously elevated, normal today -AST 16 and ALT 26 -continue immunotherapy -answered all of patient's questions regarding lab work -Patient is planning to follow-up with vascular surgery soon -Continue to follow-up with Dr. Rosana Fret for optimal GI care  FOLLOW-UP: Per integrated scheduling MD visit in 8 weeks  The total time spent in the appointment was 20 minutes* .  All of the patient's questions were answered with apparent satisfaction. The patient knows to call the clinic with any problems, questions or concerns.   Wyvonnia Lora MD MS AAHIVMS [facility] Hematology/Oncology Physician [facility]  .*Total Encounter Time as defined by the Centers for  Medicare and Medicaid Services includes, in addition to the face-to-face time of a patient visit (documented in the note above) non-face-to-face time: obtaining and reviewing outside history, ordering and reviewing medications, tests or procedures, care coordination (communications with other health care professionals or caregivers) and documentation in the medical record.    I,Mitra Faeizi,acting as a Neurosurgeon for Wyvonnia Lora, MD.,have documented all relevant documentation on the behalf of Wyvonnia Lora, MD,as directed by  Wyvonnia Lora, MD while in the presence of Wyvonnia Lora, MD.  .I have reviewed the above documentation for accuracy and completeness, and I agree with the above. Johney Maine MD

## 2022-08-28 NOTE — Patient Instructions (Signed)
[facility]  Discharge Instructions: Thank you for choosing [facility] to provide your oncology and hematology care.   If you have a lab appointment with the Cancer Center, please go directly to the Cancer Center and check in at the registration area.   Wear comfortable clothing and clothing appropriate for easy access to any Portacath or PICC line.   We strive to give you quality time with your provider. You may need to reschedule your appointment if you arrive late (15 or more minutes).  Arriving late affects you and other patients whose appointments are after yours.  Also, if you miss three or more appointments without notifying the office, you may be dismissed from the clinic at the provider's discretion.      For prescription refill requests, have your pharmacy contact our office and allow 72 hours for refills to be completed.    Today you received the following chemotherapy and/or immunotherapy agents: Opdivo      To help prevent nausea and vomiting after your treatment, we encourage you to take your nausea medication as directed.  BELOW ARE SYMPTOMS THAT SHOULD BE REPORTED IMMEDIATELY: *FEVER GREATER THAN 100.4 F (38 C) OR HIGHER *CHILLS OR SWEATING *NAUSEA AND VOMITING THAT IS NOT CONTROLLED WITH YOUR NAUSEA MEDICATION *UNUSUAL SHORTNESS OF BREATH *UNUSUAL BRUISING OR BLEEDING *URINARY PROBLEMS (pain or burning when urinating, or frequent urination) *BOWEL PROBLEMS (unusual diarrhea, constipation, pain near the anus) TENDERNESS IN MOUTH AND THROAT WITH OR WITHOUT PRESENCE OF ULCERS (sore throat, sores in mouth, or a toothache) UNUSUAL RASH, SWELLING OR PAIN  UNUSUAL VAGINAL DISCHARGE OR ITCHING   Items with * indicate a potential emergency and should be followed up as soon as possible or go to the Emergency Department if any problems should occur.  Please show the CHEMOTHERAPY ALERT CARD or IMMUNOTHERAPY ALERT CARD at check-in  to the Emergency Department and triage nurse.  Should you have questions after your visit or need to cancel or reschedule your appointment, please contact [facility]  Dept: [phone]  and follow the prompts.  Office hours are 8:00 a.m. to 4:30 p.m. Monday - Friday. Please note that voicemails left after 4:00 p.m. may not be returned until the following business day.  We are closed weekends and major holidays. You have access to a nurse at all times for urgent questions. Please call the main number to the clinic Dept: [phone] and follow the prompts.   For any non-urgent questions, you may also contact your provider using MyChart. We now offer e-Visits for anyone 18 and older to request care online for non-urgent symptoms. For details visit [email or web address].   Also download the MyChart app! Go to the app store, search "MyChart", open the app, select [facility], and log in with your MyChart username and password.   

## 2022-08-29 ENCOUNTER — Other Ambulatory Visit: Payer: Self-pay

## 2022-08-30 LAB — T4: T4, Total: 8.8 ug/dL (ref 4.5–12.0)

## 2022-09-03 ENCOUNTER — Encounter: Payer: Self-pay | Admitting: Hematology

## 2022-09-04 ENCOUNTER — Ambulatory Visit (INDEPENDENT_AMBULATORY_CARE_PROVIDER_SITE_OTHER): Payer: Medicare Other | Admitting: Vascular Surgery

## 2022-09-04 ENCOUNTER — Ambulatory Visit (INDEPENDENT_AMBULATORY_CARE_PROVIDER_SITE_OTHER): Payer: Medicare Other

## 2022-09-04 ENCOUNTER — Encounter: Payer: Self-pay | Admitting: Vascular Surgery

## 2022-09-04 VITALS — BP 130/75 | HR 53 | Temp 97.8°F | Ht 59.0 in | Wt 117.8 lb

## 2022-09-04 DIAGNOSIS — I739 Peripheral vascular disease, unspecified: Secondary | ICD-10-CM | POA: Diagnosis not present

## 2022-09-04 DIAGNOSIS — I70221 Atherosclerosis of native arteries of extremities with rest pain, right leg: Secondary | ICD-10-CM

## 2022-09-04 LAB — VAS US ABI WITH/WO TBI
Left ABI: 0.63
Right ABI: 0.29

## 2022-09-04 NOTE — Progress Notes (Signed)
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 female, who is here today for evaluation.  She reports nonhealing wounds on her right foot.  She does have a small area on the posterior aspect of her right heel and a small area on her fourth toe.  She reports these have been present for months but the heel wound appears to be getting better but no improvement on the fourth toe wound.  She does have very clear-cut arterial rest pain.  She reports that she wakes up with a throbbing sensation in her right foot and has to dangle her foot over the edge of the bed for relief.  She did undergo evaluation at an outpatient vascular GI lab on 07/28/2022.  She was displeased with her treatment there.  She is concerned regarding only having 1 kidney and mild renal insufficiency and was told that she would not have iodinated contrast but was given this.  Also reportedly from her attempt was made to treat her right leg to her left groin approach and then apparently attempts were made for retrograde access to her posterior tibial on the right.  This was unsuccessful and there was discussion of returning for posterior popliteal approach.  He is seeing me today for another opinion.  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  She remains quite active.  Past Medical History:  Diagnosis Date   Anemia    Arthritis    lower back, hips, hands   Biliary stricture    Diabetes mellitus (HCC)    type 2    Sabeen Piechocki cataracts, bilateral    Md just watching   Elevated liver enzymes    Family history of adverse reaction to anesthesia    Daughter hard to wake up   Gallstones    GERD (gastroesophageal reflux disease)    occasional  - diet controlled   History of blood transfusion 2018   History of hiatal hernia    HTN (hypertension)    Hyperlipidemia    Hypothyroidism    left renal ca dx'd 2018   renal cancer - left kidney removed, pill chemo x 1 yr   Myocardial infarction (HCC) 1991   no deficits   SVD (spontaneous vaginal delivery)    x 3   Wears glasses     Family History  Problem Relation Age of Onset   Heart attack Father 62   Heart disease Brother 67       CABG   Colon cancer Neg Hx    Esophageal cancer Neg Hx    Inflammatory bowel disease Neg Hx    Liver disease Neg Hx    Pancreatic cancer Neg Hx    Rectal cancer Neg Hx    Stomach cancer Neg Hx     SOCIAL HISTORY: Social History   Socioeconomic History   Marital status: Widowed    Spouse name: Not on file   Number of children: 3   Years of education: Not on file   Highest education level: Not on file  Occupational History   Occupation: retired  Tobacco Use   Smoking status: Former    Packs/day: 1.00    Years: 8.00    Additional pack years: 0.00  Total pack years: 8.00    Types: Cigarettes    Quit date: 06/18/1964    Years since quitting: 58.2   Smokeless tobacco: Never  Vaping Use   Vaping Use: Never used  Substance and Sexual Activity   Alcohol use: No   Drug use: No   Sexual activity: Not Currently    Birth control/protection: Post-menopausal  Other Topics Concern   Not on file  Social History Narrative   Married.  Three children   Social Determinants of Health   Financial Resource Strain: Low Risk  (05/23/2022)   Overall Financial Resource Strain (CARDIA)    Difficulty of Paying Living Expenses: Not hard at all  Food Insecurity: No Food Insecurity (05/23/2022)   Hunger Vital Sign    Worried About Running Out of Food in the Last Year: Never true    Ran Out of Food in the Last Year: Never true  Transportation Needs: No Transportation Needs (05/23/2022)   PRAPARE - Administrator, Civil Service (Medical): No     Lack of Transportation (Non-Medical): No  Physical Activity: Not on file  Stress: Not on file  Social Connections: Not on file  Intimate Partner Violence: Not At Risk (05/23/2022)   Humiliation, Afraid, Rape, and Kick questionnaire    Fear of Current or Ex-Partner: No    Emotionally Abused: No    Physically Abused: No    Sexually Abused: No    Allergies  Allergen Reactions   Doxycycline Nausea And Vomiting   Adhesive [Tape] Other (See Comments)    Tears skin off Paper tape is ok per patient   Duloxetine    Gabapentin (Once-Daily) Other (See Comments)    Feels "loopy"   Nsaids Other (See Comments)    Pt only has one kidney    Statins Other (See Comments)    Liver/kidney issues.    Current Outpatient Medications  Medication Sig Dispense Refill   amLODipine (NORVASC) 5 MG tablet Take 5 mg by mouth daily.   6   atorvastatin (LIPITOR) 80 MG tablet Take 80 mg by mouth at bedtime.      carvedilol (COREG) 6.25 MG tablet Take 6.25 mg by mouth 2 (two) times daily.  6   clobetasol ointment (TEMOVATE) 0.05 % Apply 1 application topically 2 (two) times daily as needed (lichen sclerosis).      clopidogrel (PLAVIX) 75 MG tablet Take 75 mg by mouth daily.     dapagliflozin propanediol (FARXIGA) 10 MG TABS tablet      Denosumab (XGEVA [place]) Inject into the skin every 6 (six) weeks.     diphenhydrAMINE (BENADRYL) 25 MG tablet Take 25 mg by mouth at bedtime.     glimepiride (AMARYL) 1 MG tablet Take 1 mg by mouth daily with breakfast.     hydrocortisone (CORTEF) 10 MG tablet Take 5-10 mg by mouth See admin instructions. Take 1 tablet (10 mg) by mouth in the morning & 1/2 tablet (5 mg) by mouth in the evening.     levothyroxine (SYNTHROID) 75 MCG tablet Take 75 mcg by mouth daily before breakfast.      lidocaine (LIDODERM) 5 % Place 1 patch onto the skin daily as needed. Remove & Discard patch within 12 hours or as directed by MD 30 patch 1   Nivolumab (OPDIVO IV) Inject 1 Dose into the vein  every 14 (fourteen) days. [facility]     omeprazole (PRILOSEC) 20 MG capsule Take 20 mg by mouth 2 (two) times daily.  repaglinide (PRANDIN) 0.5 MG tablet Take 0.5 mg by mouth 2 (two) times daily.     SSD 1 % cream Apply topically 2 (two) times daily.     traMADol (ULTRAM) 50 MG tablet Take 1 tablet (50 mg total) by mouth every 6 (six) hours as needed. 30 tablet 0   No current facility-administered medications for this visit.    REVIEW OF SYSTEMS:  [X]  denotes positive finding, [ ]  denotes negative finding Cardiac  Comments:  Chest pain or chest pressure:    Shortness of breath upon exertion:    Short of breath when lying flat:    Irregular heart rhythm:        Vascular    Pain in calf, thigh, or hip brought on by ambulation: x   Pain in feet at night that wakes you up from your sleep:  x   Blood clot in your veins: x   Leg swelling:         Pulmonary    Oxygen at home:    Productive cough:     Wheezing:         Neurologic    Sudden weakness in arms or legs:     Sudden numbness in arms or legs:     Sudden onset of difficulty speaking or slurred speech:    Temporary loss of vision in one eye:     Problems with dizziness:         Gastrointestinal    Blood in stool:     Vomited blood:         Genitourinary    Burning when urinating:     Blood in urine:        Psychiatric    Major depression:         Hematologic    Bleeding problems:    Problems with blood clotting too easily:        Skin    Rashes or ulcers:        Constitutional    Fever or chills:      PHYSICAL EXAM: Vitals:   09/04/22 1023  BP: 130/75  Pulse: (!) 53  Temp: 97.8 F (36.6 C)  SpO2: 99%  Weight: 117 lb 12.8 oz (53.4 kg)  Height: 4\' 11"  (1.499 m)    GENERAL: The patient is a well-nourished female, in no acute distress. The vital signs are documented above. CARDIOVASCULAR: 2+ femoral pulses bilaterally.  Absent popliteal and distal pulses. PULMONARY: There is good air  exchange  MUSCULOSKELETAL: There are no major deformities or cyanosis. NEUROLOGIC: No focal weakness or paresthesias are detected. SKIN: Mild less than 1 cm ulcer on the posterior right heel and fourth toe. PSYCHIATRIC: The patient has a normal affect.  DATA:  Noninvasive studies in our office today reveal ankle arm index of 0.29 on the right with dampened monophasic flow and 0.63 on the left with monophasic flow  MEDICAL ISSUES: Had long discussion with the patient regarding her right leg critical limb ischemia and risk for limb loss.  I have recommended that we attempt to Dr. Randie Heinz images but at least report from [facility] vascular in [place].  She will go by their office and request this information and bring it to our office in [place].  I will call her subsequent to this with further recommendation.   Larina Earthly, MD FACS [facility] [phone] Pager [phone]  Note: Portions of this report may have been transcribed  using voice recognition software.  Every effort has been made to ensure accuracy; however, inadvertent computerized transcription errors may still be present.

## 2022-09-05 ENCOUNTER — Other Ambulatory Visit: Payer: Self-pay

## 2022-09-06 ENCOUNTER — Telehealth: Payer: Self-pay | Admitting: Hematology

## 2022-09-10 ENCOUNTER — Other Ambulatory Visit: Payer: Self-pay

## 2022-09-10 DIAGNOSIS — C7951 Secondary malignant neoplasm of bone: Secondary | ICD-10-CM

## 2022-09-10 DIAGNOSIS — C642 Malignant neoplasm of left kidney, except renal pelvis: Secondary | ICD-10-CM

## 2022-09-11 ENCOUNTER — Inpatient Hospital Stay: Payer: Medicare Other

## 2022-09-11 ENCOUNTER — Other Ambulatory Visit: Payer: Self-pay | Admitting: Hematology

## 2022-09-11 VITALS — BP 136/55 | HR 52 | Temp 97.7°F | Resp 16 | Ht <= 58 in | Wt 118.6 lb

## 2022-09-11 DIAGNOSIS — C642 Malignant neoplasm of left kidney, except renal pelvis: Secondary | ICD-10-CM

## 2022-09-11 DIAGNOSIS — C7951 Secondary malignant neoplasm of bone: Secondary | ICD-10-CM

## 2022-09-11 DIAGNOSIS — Z5112 Encounter for antineoplastic immunotherapy: Secondary | ICD-10-CM | POA: Diagnosis not present

## 2022-09-11 DIAGNOSIS — Z95828 Presence of other vascular implants and grafts: Secondary | ICD-10-CM

## 2022-09-11 LAB — CBC WITH DIFFERENTIAL (CANCER CENTER ONLY)
Abs Immature Granulocytes: 0.06 10*3/uL (ref 0.00–0.07)
Basophils Absolute: 0.1 10*3/uL (ref 0.0–0.1)
Basophils Relative: 1 %
Eosinophils Absolute: 0.2 10*3/uL (ref 0.0–0.5)
Eosinophils Relative: 3 %
HCT: 39.8 % (ref 36.0–46.0)
Hemoglobin: 12.8 g/dL (ref 12.0–15.0)
Immature Granulocytes: 1 %
Lymphocytes Relative: 10 %
Lymphs Abs: 0.8 10*3/uL (ref 0.7–4.0)
MCH: 29.1 pg (ref 26.0–34.0)
MCHC: 32.2 g/dL (ref 30.0–36.0)
MCV: 90.5 fL (ref 80.0–100.0)
Monocytes Absolute: 0.8 10*3/uL (ref 0.1–1.0)
Monocytes Relative: 10 %
Neutro Abs: 6.2 10*3/uL (ref 1.7–7.7)
Neutrophils Relative %: 75 %
Platelet Count: 209 10*3/uL (ref 150–400)
RBC: 4.4 MIL/uL (ref 3.87–5.11)
RDW: 16.5 % — ABNORMAL HIGH (ref 11.5–15.5)
WBC Count: 8 10*3/uL (ref 4.0–10.5)
nRBC: 0 % (ref 0.0–0.2)

## 2022-09-11 LAB — CMP (CANCER CENTER ONLY)
ALT: 31 U/L (ref 0–44)
AST: 21 U/L (ref 15–41)
Albumin: 3.7 g/dL (ref 3.5–5.0)
Alkaline Phosphatase: 70 U/L (ref 38–126)
Anion gap: 6 (ref 5–15)
BUN: 30 mg/dL — ABNORMAL HIGH (ref 8–23)
CO2: 21 mmol/L — ABNORMAL LOW (ref 22–32)
Calcium: 9.1 mg/dL (ref 8.9–10.3)
Chloride: 115 mmol/L — ABNORMAL HIGH (ref 98–111)
Creatinine: 1.4 mg/dL — ABNORMAL HIGH (ref 0.44–1.00)
GFR, Estimated: 38 mL/min — ABNORMAL LOW (ref >60)
Glucose, Bld: 128 mg/dL — ABNORMAL HIGH (ref 70–99)
Potassium: 4.3 mmol/L (ref 3.5–5.1)
Sodium: 142 mmol/L (ref 135–145)
Total Bilirubin: 0.4 mg/dL (ref 0.3–1.2)
Total Protein: 6 g/dL — ABNORMAL LOW (ref 6.5–8.1)

## 2022-09-11 LAB — TSH: TSH: 0.71 u[IU]/mL (ref 0.350–4.500)

## 2022-09-11 MED ORDER — HEPARIN SOD (PORK) LOCK FLUSH 100 UNIT/ML IV SOLN
500.0000 [IU] | Freq: Once | INTRAVENOUS | Status: AC | PRN
  Administered 2022-09-11: 500 [IU]

## 2022-09-11 MED ORDER — SODIUM CHLORIDE 0.9 % IV SOLN: Freq: Once | INTRAVENOUS | Status: AC

## 2022-09-11 MED ORDER — SODIUM CHLORIDE 0.9% FLUSH
10.0000 mL | Freq: Once | INTRAVENOUS | Status: AC
  Administered 2022-09-11: 10 mL

## 2022-09-11 MED ORDER — SODIUM CHLORIDE 0.9% FLUSH
10.0000 mL | INTRAVENOUS | Status: DC | PRN
  Administered 2022-09-11: 10 mL

## 2022-09-11 MED ORDER — SODIUM CHLORIDE 0.9 % IV SOLN
240.0000 mg | Freq: Once | INTRAVENOUS | Status: AC
  Administered 2022-09-11: 240 mg via INTRAVENOUS
  Filled 2022-09-11: qty 24

## 2022-09-11 NOTE — Patient Instructions (Signed)
[facility]  Discharge Instructions: Thank you for choosing [facility] to provide your oncology and hematology care.   If you have a lab appointment with the Cancer Center, please go directly to the Cancer Center and check in at the registration area.   Wear comfortable clothing and clothing appropriate for easy access to any Portacath or PICC line.   We strive to give you quality time with your provider. You may need to reschedule your appointment if you arrive late (15 or more minutes).  Arriving late affects you and other patients whose appointments are after yours.  Also, if you miss three or more appointments without notifying the office, you may be dismissed from the clinic at the provider's discretion.      For prescription refill requests, have your pharmacy contact our office and allow 72 hours for refills to be completed.    Today you received the following chemotherapy and/or immunotherapy agents: Opdivo      To help prevent nausea and vomiting after your treatment, we encourage you to take your nausea medication as directed.  BELOW ARE SYMPTOMS THAT SHOULD BE REPORTED IMMEDIATELY: *FEVER GREATER THAN 100.4 F (38 C) OR HIGHER *CHILLS OR SWEATING *NAUSEA AND VOMITING THAT IS NOT CONTROLLED WITH YOUR NAUSEA MEDICATION *UNUSUAL SHORTNESS OF BREATH *UNUSUAL BRUISING OR BLEEDING *URINARY PROBLEMS (pain or burning when urinating, or frequent urination) *BOWEL PROBLEMS (unusual diarrhea, constipation, pain near the anus) TENDERNESS IN MOUTH AND THROAT WITH OR WITHOUT PRESENCE OF ULCERS (sore throat, sores in mouth, or a toothache) UNUSUAL RASH, SWELLING OR PAIN  UNUSUAL VAGINAL DISCHARGE OR ITCHING   Items with * indicate a potential emergency and should be followed up as soon as possible or go to the Emergency Department if any problems should occur.  Please show the CHEMOTHERAPY ALERT CARD or IMMUNOTHERAPY ALERT CARD at check-in  to the Emergency Department and triage nurse.  Should you have questions after your visit or need to cancel or reschedule your appointment, please contact [facility]  Dept: [phone]  and follow the prompts.  Office hours are 8:00 a.m. to 4:30 p.m. Monday - Friday. Please note that voicemails left after 4:00 p.m. may not be returned until the following business day.  We are closed weekends and major holidays. You have access to a nurse at all times for urgent questions. Please call the main number to the clinic Dept: [phone] and follow the prompts.   For any non-urgent questions, you may also contact your provider using MyChart. We now offer e-Visits for anyone 18 and older to request care online for non-urgent symptoms. For details visit [email or web address].   Also download the MyChart app! Go to the app store, search "MyChart", open the app, select [facility], and log in with your MyChart username and password.   

## 2022-09-13 LAB — T4: T4, Total: 8.1 ug/dL (ref 4.5–12.0)

## 2022-09-16 ENCOUNTER — Other Ambulatory Visit: Payer: Self-pay

## 2022-09-17 ENCOUNTER — Other Ambulatory Visit: Payer: Self-pay

## 2022-09-17 DIAGNOSIS — C642 Malignant neoplasm of left kidney, except renal pelvis: Secondary | ICD-10-CM

## 2022-09-17 DIAGNOSIS — C7951 Secondary malignant neoplasm of bone: Secondary | ICD-10-CM

## 2022-09-17 MED ORDER — LIDOCAINE 5 % EX PTCH
1.0000 | MEDICATED_PATCH | Freq: Every day | CUTANEOUS | 1 refills | Status: DC | PRN
Start: 2022-09-17 — End: 2023-04-12

## 2022-09-25 ENCOUNTER — Inpatient Hospital Stay: Payer: Medicare Other | Attending: Hematology

## 2022-09-25 ENCOUNTER — Inpatient Hospital Stay: Payer: Medicare Other

## 2022-09-25 ENCOUNTER — Other Ambulatory Visit: Payer: Self-pay

## 2022-09-25 VITALS — BP 129/67 | HR 59 | Temp 97.8°F | Wt 120.2 lb

## 2022-09-25 DIAGNOSIS — C7951 Secondary malignant neoplasm of bone: Secondary | ICD-10-CM

## 2022-09-25 DIAGNOSIS — C641 Malignant neoplasm of right kidney, except renal pelvis: Secondary | ICD-10-CM | POA: Insufficient documentation

## 2022-09-25 DIAGNOSIS — Z5112 Encounter for antineoplastic immunotherapy: Secondary | ICD-10-CM | POA: Diagnosis present

## 2022-09-25 DIAGNOSIS — Z5111 Encounter for antineoplastic chemotherapy: Secondary | ICD-10-CM | POA: Insufficient documentation

## 2022-09-25 DIAGNOSIS — E039 Hypothyroidism, unspecified: Secondary | ICD-10-CM | POA: Insufficient documentation

## 2022-09-25 DIAGNOSIS — Z95828 Presence of other vascular implants and grafts: Secondary | ICD-10-CM

## 2022-09-25 DIAGNOSIS — C642 Malignant neoplasm of left kidney, except renal pelvis: Secondary | ICD-10-CM

## 2022-09-25 LAB — CMP (CANCER CENTER ONLY)
ALT: 18 U/L (ref 0–44)
AST: 17 U/L (ref 15–41)
Albumin: 3.8 g/dL (ref 3.5–5.0)
Alkaline Phosphatase: 68 U/L (ref 38–126)
Anion gap: 6 (ref 5–15)
BUN: 37 mg/dL — ABNORMAL HIGH (ref 8–23)
CO2: 20 mmol/L — ABNORMAL LOW (ref 22–32)
Calcium: 8.5 mg/dL — ABNORMAL LOW (ref 8.9–10.3)
Chloride: 112 mmol/L — ABNORMAL HIGH (ref 98–111)
Creatinine: 1.55 mg/dL — ABNORMAL HIGH (ref 0.44–1.00)
GFR, Estimated: 34 mL/min — ABNORMAL LOW (ref >60)
Glucose, Bld: 147 mg/dL — ABNORMAL HIGH (ref 70–99)
Potassium: 4.6 mmol/L (ref 3.5–5.1)
Sodium: 138 mmol/L (ref 135–145)
Total Bilirubin: 0.5 mg/dL (ref 0.3–1.2)
Total Protein: 6.3 g/dL — ABNORMAL LOW (ref 6.5–8.1)

## 2022-09-25 LAB — CBC WITH DIFFERENTIAL (CANCER CENTER ONLY)
Abs Immature Granulocytes: 0.09 10*3/uL — ABNORMAL HIGH (ref 0.00–0.07)
Basophils Absolute: 0.1 10*3/uL (ref 0.0–0.1)
Basophils Relative: 1 %
Eosinophils Absolute: 0.1 10*3/uL (ref 0.0–0.5)
Eosinophils Relative: 1 %
HCT: 40.1 % (ref 36.0–46.0)
Hemoglobin: 13 g/dL (ref 12.0–15.0)
Immature Granulocytes: 1 %
Lymphocytes Relative: 12 %
Lymphs Abs: 0.9 10*3/uL (ref 0.7–4.0)
MCH: 29.1 pg (ref 26.0–34.0)
MCHC: 32.4 g/dL (ref 30.0–36.0)
MCV: 89.7 fL (ref 80.0–100.0)
Monocytes Absolute: 0.9 10*3/uL (ref 0.1–1.0)
Monocytes Relative: 12 %
Neutro Abs: 5.7 10*3/uL (ref 1.7–7.7)
Neutrophils Relative %: 73 %
Platelet Count: 250 10*3/uL (ref 150–400)
RBC: 4.47 MIL/uL (ref 3.87–5.11)
RDW: 16.4 % — ABNORMAL HIGH (ref 11.5–15.5)
WBC Count: 7.8 10*3/uL (ref 4.0–10.5)
nRBC: 0 % (ref 0.0–0.2)

## 2022-09-25 LAB — TSH: TSH: 0.993 u[IU]/mL (ref 0.350–4.500)

## 2022-09-25 MED ORDER — SODIUM CHLORIDE 0.9% FLUSH
10.0000 mL | INTRAVENOUS | Status: DC | PRN
  Administered 2022-09-25: 10 mL

## 2022-09-25 MED ORDER — SODIUM CHLORIDE 0.9 % IV SOLN: Freq: Once | INTRAVENOUS | Status: AC

## 2022-09-25 MED ORDER — DENOSUMAB 120 MG/1.7ML ~~LOC~~ SOLN
120.0000 mg | Freq: Once | SUBCUTANEOUS | Status: AC
  Administered 2022-09-25: 120 mg via SUBCUTANEOUS
  Filled 2022-09-25: qty 1.7

## 2022-09-25 MED ORDER — HEPARIN SOD (PORK) LOCK FLUSH 100 UNIT/ML IV SOLN
500.0000 [IU] | Freq: Once | INTRAVENOUS | Status: AC | PRN
  Administered 2022-09-25: 500 [IU]

## 2022-09-25 MED ORDER — SODIUM CHLORIDE 0.9 % IV SOLN
240.0000 mg | Freq: Once | INTRAVENOUS | Status: AC
  Administered 2022-09-25: 240 mg via INTRAVENOUS
  Filled 2022-09-25: qty 24

## 2022-09-25 MED ORDER — SODIUM CHLORIDE 0.9% FLUSH
10.0000 mL | Freq: Once | INTRAVENOUS | Status: AC
  Administered 2022-09-25: 10 mL

## 2022-09-25 NOTE — Progress Notes (Signed)
Per Dr Candise Che ok to proceed with creatnine of 1.55 today

## 2022-09-25 NOTE — Patient Instructions (Signed)
[facility]  Discharge Instructions: Thank you for choosing [facility] to provide your oncology and hematology care.   If you have a lab appointment with the Cancer Center, please go directly to the Cancer Center and check in at the registration area.   Wear comfortable clothing and clothing appropriate for easy access to any Portacath or PICC line.   We strive to give you quality time with your provider. You may need to reschedule your appointment if you arrive late (15 or more minutes).  Arriving late affects you and other patients whose appointments are after yours.  Also, if you miss three or more appointments without notifying the office, you may be dismissed from the clinic at the provider's discretion.      For prescription refill requests, have your pharmacy contact our office and allow 72 hours for refills to be completed.    Today you received the following chemotherapy and/or immunotherapy agents: Nivolumab.       To help prevent nausea and vomiting after your treatment, we encourage you to take your nausea medication as directed.  BELOW ARE SYMPTOMS THAT SHOULD BE REPORTED IMMEDIATELY: *FEVER GREATER THAN 100.4 F (38 C) OR HIGHER *CHILLS OR SWEATING *NAUSEA AND VOMITING THAT IS NOT CONTROLLED WITH YOUR NAUSEA MEDICATION *UNUSUAL SHORTNESS OF BREATH *UNUSUAL BRUISING OR BLEEDING *URINARY PROBLEMS (pain or burning when urinating, or frequent urination) *BOWEL PROBLEMS (unusual diarrhea, constipation, pain near the anus) TENDERNESS IN MOUTH AND THROAT WITH OR WITHOUT PRESENCE OF ULCERS (sore throat, sores in mouth, or a toothache) UNUSUAL RASH, SWELLING OR PAIN  UNUSUAL VAGINAL DISCHARGE OR ITCHING   Items with * indicate a potential emergency and should be followed up as soon as possible or go to the Emergency Department if any problems should occur.  Please show the CHEMOTHERAPY ALERT CARD or IMMUNOTHERAPY ALERT CARD at  check-in to the Emergency Department and triage nurse.  Should you have questions after your visit or need to cancel or reschedule your appointment, please contact [facility]  Dept: [phone]  and follow the prompts.  Office hours are 8:00 a.m. to 4:30 p.m. Monday - Friday. Please note that voicemails left after 4:00 p.m. may not be returned until the following business day.  We are closed weekends and major holidays. You have access to a nurse at all times for urgent questions. Please call the main number to the clinic Dept: [phone] and follow the prompts.   For any non-urgent questions, you may also contact your provider using MyChart. We now offer e-Visits for anyone 18 and older to request care online for non-urgent symptoms. For details visit [email or web address].   Also download the MyChart app! Go to the app store, search "MyChart", open the app, select [facility], and log in with your MyChart username and password.  

## 2022-09-27 LAB — T4: T4, Total: 8.8 ug/dL (ref 4.5–12.0)

## 2022-09-30 ENCOUNTER — Other Ambulatory Visit: Payer: Self-pay

## 2022-09-30 DIAGNOSIS — I70221 Atherosclerosis of native arteries of extremities with rest pain, right leg: Secondary | ICD-10-CM

## 2022-10-09 ENCOUNTER — Inpatient Hospital Stay: Payer: Medicare Other

## 2022-10-09 ENCOUNTER — Other Ambulatory Visit: Payer: Self-pay

## 2022-10-09 VITALS — BP 129/53 | HR 54 | Temp 97.8°F | Resp 17

## 2022-10-09 DIAGNOSIS — Z5111 Encounter for antineoplastic chemotherapy: Secondary | ICD-10-CM | POA: Diagnosis not present

## 2022-10-09 DIAGNOSIS — Z95828 Presence of other vascular implants and grafts: Secondary | ICD-10-CM

## 2022-10-09 DIAGNOSIS — C7951 Secondary malignant neoplasm of bone: Secondary | ICD-10-CM

## 2022-10-09 DIAGNOSIS — C642 Malignant neoplasm of left kidney, except renal pelvis: Secondary | ICD-10-CM

## 2022-10-09 LAB — CBC WITH DIFFERENTIAL (CANCER CENTER ONLY)
Abs Immature Granulocytes: 0.11 10*3/uL — ABNORMAL HIGH (ref 0.00–0.07)
Basophils Absolute: 0.1 10*3/uL (ref 0.0–0.1)
Basophils Relative: 1 %
Eosinophils Absolute: 0.1 10*3/uL (ref 0.0–0.5)
Eosinophils Relative: 1 %
HCT: 39.9 % (ref 36.0–46.0)
Hemoglobin: 12.6 g/dL (ref 12.0–15.0)
Immature Granulocytes: 1 %
Lymphocytes Relative: 13 %
Lymphs Abs: 1.1 10*3/uL (ref 0.7–4.0)
MCH: 29 pg (ref 26.0–34.0)
MCHC: 31.6 g/dL (ref 30.0–36.0)
MCV: 91.9 fL (ref 80.0–100.0)
Monocytes Absolute: 1.1 10*3/uL — ABNORMAL HIGH (ref 0.1–1.0)
Monocytes Relative: 13 %
Neutro Abs: 6 10*3/uL (ref 1.7–7.7)
Neutrophils Relative %: 71 %
Platelet Count: 224 10*3/uL (ref 150–400)
RBC: 4.34 MIL/uL (ref 3.87–5.11)
RDW: 16.5 % — ABNORMAL HIGH (ref 11.5–15.5)
WBC Count: 8.4 10*3/uL (ref 4.0–10.5)
nRBC: 0 % (ref 0.0–0.2)

## 2022-10-09 LAB — CMP (CANCER CENTER ONLY)
ALT: 37 U/L (ref 0–44)
AST: 46 U/L — ABNORMAL HIGH (ref 15–41)
Albumin: 3.6 g/dL (ref 3.5–5.0)
Alkaline Phosphatase: 81 U/L (ref 38–126)
Anion gap: 7 (ref 5–15)
BUN: 49 mg/dL — ABNORMAL HIGH (ref 8–23)
CO2: 18 mmol/L — ABNORMAL LOW (ref 22–32)
Calcium: 8.6 mg/dL — ABNORMAL LOW (ref 8.9–10.3)
Chloride: 111 mmol/L (ref 98–111)
Creatinine: 1.83 mg/dL — ABNORMAL HIGH (ref 0.44–1.00)
GFR, Estimated: 28 mL/min — ABNORMAL LOW (ref >60)
Glucose, Bld: 290 mg/dL — ABNORMAL HIGH (ref 70–99)
Potassium: 4.6 mmol/L (ref 3.5–5.1)
Sodium: 136 mmol/L (ref 135–145)
Total Bilirubin: 0.4 mg/dL (ref 0.3–1.2)
Total Protein: 5.9 g/dL — ABNORMAL LOW (ref 6.5–8.1)

## 2022-10-09 LAB — TSH: TSH: 1.063 u[IU]/mL (ref 0.350–4.500)

## 2022-10-09 MED ORDER — SODIUM CHLORIDE 0.9% FLUSH
10.0000 mL | INTRAVENOUS | Status: DC | PRN
  Administered 2022-10-09: 10 mL

## 2022-10-09 MED ORDER — HEPARIN SOD (PORK) LOCK FLUSH 100 UNIT/ML IV SOLN
500.0000 [IU] | Freq: Once | INTRAVENOUS | Status: AC | PRN
  Administered 2022-10-09: 500 [IU]

## 2022-10-09 MED ORDER — SODIUM CHLORIDE 0.9 % IV SOLN
240.0000 mg | Freq: Once | INTRAVENOUS | Status: AC
  Administered 2022-10-09: 240 mg via INTRAVENOUS
  Filled 2022-10-09: qty 24

## 2022-10-09 MED ORDER — SODIUM CHLORIDE 0.9% FLUSH
10.0000 mL | Freq: Once | INTRAVENOUS | Status: AC
  Administered 2022-10-09: 10 mL

## 2022-10-09 MED ORDER — SODIUM CHLORIDE 0.9 % IV SOLN: Freq: Once | INTRAVENOUS | Status: AC

## 2022-10-09 NOTE — Progress Notes (Signed)
Okay to treat with creat 1.83 per Dr. Candise Che

## 2022-10-11 LAB — T4: T4, Total: 7.9 ug/dL (ref 4.5–12.0)

## 2022-10-16 ENCOUNTER — Ambulatory Visit: Payer: Medicare Other | Admitting: Vascular Surgery

## 2022-10-21 ENCOUNTER — Encounter (HOSPITAL_COMMUNITY)
Admission: RE | Disposition: A | Discharge status: Home / Self Care | Payer: Self-pay | Source: Home / Self Care | Attending: Vascular Surgery

## 2022-10-21 ENCOUNTER — Observation Stay (HOSPITAL_COMMUNITY)
Admission: RE | Admit: 2022-10-21 | Discharge: 2022-10-22 | Disposition: A | Discharge status: Home / Self Care | Payer: Medicare Other | Attending: Vascular Surgery | Admitting: Vascular Surgery

## 2022-10-21 DIAGNOSIS — R2681 Unsteadiness on feet: Secondary | ICD-10-CM | POA: Insufficient documentation

## 2022-10-21 DIAGNOSIS — I739 Peripheral vascular disease, unspecified: Principal | ICD-10-CM | POA: Diagnosis present

## 2022-10-21 DIAGNOSIS — L97419 Non-pressure chronic ulcer of right heel and midfoot with unspecified severity: Secondary | ICD-10-CM | POA: Diagnosis not present

## 2022-10-21 DIAGNOSIS — I10 Essential (primary) hypertension: Secondary | ICD-10-CM | POA: Diagnosis not present

## 2022-10-21 DIAGNOSIS — L97519 Non-pressure chronic ulcer of other part of right foot with unspecified severity: Secondary | ICD-10-CM | POA: Diagnosis not present

## 2022-10-21 DIAGNOSIS — Z87891 Personal history of nicotine dependence: Secondary | ICD-10-CM | POA: Insufficient documentation

## 2022-10-21 DIAGNOSIS — Z85528 Personal history of other malignant neoplasm of kidney: Secondary | ICD-10-CM | POA: Insufficient documentation

## 2022-10-21 DIAGNOSIS — I70235 Atherosclerosis of native arteries of right leg with ulceration of other part of foot: Secondary | ICD-10-CM | POA: Diagnosis not present

## 2022-10-21 DIAGNOSIS — R2689 Other abnormalities of gait and mobility: Secondary | ICD-10-CM | POA: Insufficient documentation

## 2022-10-21 DIAGNOSIS — Z905 Acquired absence of kidney: Secondary | ICD-10-CM | POA: Diagnosis not present

## 2022-10-21 DIAGNOSIS — E11621 Type 2 diabetes mellitus with foot ulcer: Secondary | ICD-10-CM | POA: Diagnosis not present

## 2022-10-21 DIAGNOSIS — I70234 Atherosclerosis of native arteries of right leg with ulceration of heel and midfoot: Secondary | ICD-10-CM | POA: Insufficient documentation

## 2022-10-21 DIAGNOSIS — I70221 Atherosclerosis of native arteries of extremities with rest pain, right leg: Secondary | ICD-10-CM | POA: Diagnosis not present

## 2022-10-21 DIAGNOSIS — M79662 Pain in left lower leg: Secondary | ICD-10-CM | POA: Insufficient documentation

## 2022-10-21 DIAGNOSIS — N2889 Other specified disorders of kidney and ureter: Secondary | ICD-10-CM | POA: Insufficient documentation

## 2022-10-21 DIAGNOSIS — M79661 Pain in right lower leg: Secondary | ICD-10-CM | POA: Insufficient documentation

## 2022-10-21 DIAGNOSIS — C7951 Secondary malignant neoplasm of bone: Secondary | ICD-10-CM | POA: Insufficient documentation

## 2022-10-21 DIAGNOSIS — E1151 Type 2 diabetes mellitus with diabetic peripheral angiopathy without gangrene: Secondary | ICD-10-CM | POA: Diagnosis present

## 2022-10-21 DIAGNOSIS — M6281 Muscle weakness (generalized): Secondary | ICD-10-CM | POA: Insufficient documentation

## 2022-10-21 HISTORY — PX: PERIPHERAL VASCULAR INTERVENTION: CATH118257

## 2022-10-21 HISTORY — PX: ABDOMINAL AORTOGRAM W/LOWER EXTREMITY: CATH118223

## 2022-10-21 LAB — POCT I-STAT, CHEM 8
BUN: 39 mg/dL — ABNORMAL HIGH (ref 8–23)
Calcium, Ion: 1.33 mmol/L (ref 1.15–1.40)
Chloride: 111 mmol/L (ref 98–111)
Creatinine, Ser: 1.7 mg/dL — ABNORMAL HIGH (ref 0.44–1.00)
Glucose, Bld: 118 mg/dL — ABNORMAL HIGH (ref 70–99)
HCT: 45 % (ref 36.0–46.0)
Hemoglobin: 15.3 g/dL — ABNORMAL HIGH (ref 12.0–15.0)
Potassium: 4.6 mmol/L (ref 3.5–5.1)
Sodium: 141 mmol/L (ref 135–145)
TCO2: 20 mmol/L — ABNORMAL LOW (ref 22–32)

## 2022-10-21 LAB — GLUCOSE, CAPILLARY: Glucose-Capillary: 160 mg/dL — ABNORMAL HIGH (ref 70–99)

## 2022-10-21 SURGERY — ABDOMINAL AORTOGRAM W/LOWER EXTREMITY
Anesthesia: LOCAL

## 2022-10-21 MED ORDER — MORPHINE SULFATE (PF) 2 MG/ML IV SOLN
2.0000 mg | INTRAVENOUS | Status: DC | PRN
  Administered 2022-10-21 (×2): 2 mg via INTRAVENOUS
  Filled 2022-10-21 (×2): qty 1

## 2022-10-21 MED ORDER — ONDANSETRON HCL 4 MG/2ML IJ SOLN
4.0000 mg | Freq: Four times a day (QID) | INTRAMUSCULAR | Status: DC | PRN
  Administered 2022-10-21: 4 mg via INTRAVENOUS
  Filled 2022-10-21: qty 2

## 2022-10-21 MED ORDER — SODIUM CHLORIDE 0.9% FLUSH
3.0000 mL | INTRAVENOUS | Status: DC | PRN
  Administered 2022-10-22: 3 mL via INTRAVENOUS

## 2022-10-21 MED ORDER — HEPARIN (PORCINE) IN NACL 1000-0.9 UT/500ML-% IV SOLN
INTRAVENOUS | Status: DC | PRN
  Administered 2022-10-21 (×2): 500 mL

## 2022-10-21 MED ORDER — SODIUM CHLORIDE 0.9 % IV SOLN: INTRAVENOUS | Status: DC

## 2022-10-21 MED ORDER — OXYCODONE HCL 5 MG PO TABS
5.0000 mg | ORAL_TABLET | ORAL | Status: DC | PRN
  Administered 2022-10-21: 5 mg via ORAL
  Administered 2022-10-22: 10 mg via ORAL
  Administered 2022-10-22: 5 mg via ORAL
  Filled 2022-10-21 (×2): qty 1
  Filled 2022-10-21: qty 2

## 2022-10-21 MED ORDER — HYDRALAZINE HCL 20 MG/ML IJ SOLN: 5.0000 mg | INTRAMUSCULAR | Status: DC | PRN

## 2022-10-21 MED ORDER — IODIXANOL 320 MG/ML IV SOLN
INTRAVENOUS | Status: DC | PRN
  Administered 2022-10-21: 40 mL

## 2022-10-21 MED ORDER — MIDAZOLAM HCL 2 MG/2ML IJ SOLN
INTRAMUSCULAR | Status: AC
  Filled 2022-10-21: qty 2

## 2022-10-21 MED ORDER — MIDAZOLAM HCL 2 MG/2ML IJ SOLN
INTRAMUSCULAR | Status: DC | PRN
  Administered 2022-10-21 (×2): 1 mg via INTRAVENOUS

## 2022-10-21 MED ORDER — LIDOCAINE HCL (PF) 1 % IJ SOLN
INTRAMUSCULAR | Status: DC | PRN
  Administered 2022-10-21: 10 mL
  Administered 2022-10-21: 5 mL

## 2022-10-21 MED ORDER — HEPARIN SODIUM (PORCINE) 1000 UNIT/ML IJ SOLN
INTRAMUSCULAR | Status: DC | PRN
  Administered 2022-10-21: 3000 [IU] via INTRAVENOUS
  Administered 2022-10-21: 6000 [IU] via INTRAVENOUS

## 2022-10-21 MED ORDER — LABETALOL HCL 5 MG/ML IV SOLN: 10.0000 mg | INTRAVENOUS | Status: DC | PRN

## 2022-10-21 MED ORDER — ACETAMINOPHEN 325 MG PO TABS: 650.0000 mg | ORAL_TABLET | ORAL | Status: DC | PRN

## 2022-10-21 MED ORDER — SODIUM CHLORIDE 0.9% FLUSH
3.0000 mL | Freq: Two times a day (BID) | INTRAVENOUS | Status: DC
  Administered 2022-10-22: 3 mL via INTRAVENOUS

## 2022-10-21 MED ORDER — HEPARIN SODIUM (PORCINE) 1000 UNIT/ML IJ SOLN
INTRAMUSCULAR | Status: AC
  Filled 2022-10-21: qty 10

## 2022-10-21 MED ORDER — LIDOCAINE HCL (PF) 1 % IJ SOLN
INTRAMUSCULAR | Status: AC
  Filled 2022-10-21: qty 30

## 2022-10-21 MED ORDER — ATROPINE SULFATE 1 MG/10ML IJ SOSY
PREFILLED_SYRINGE | INTRAMUSCULAR | Status: AC
  Filled 2022-10-21: qty 10

## 2022-10-21 MED ORDER — SODIUM CHLORIDE 0.9 % IV SOLN: 250.0000 mL | INTRAVENOUS | Status: DC | PRN

## 2022-10-21 MED ORDER — FENTANYL CITRATE (PF) 100 MCG/2ML IJ SOLN
INTRAMUSCULAR | Status: AC
  Filled 2022-10-21: qty 2

## 2022-10-21 MED ORDER — FENTANYL CITRATE (PF) 100 MCG/2ML IJ SOLN
INTRAMUSCULAR | Status: DC | PRN
  Administered 2022-10-21: 25 ug via INTRAVENOUS
  Administered 2022-10-21: 50 ug via INTRAVENOUS
  Administered 2022-10-21: 25 ug via INTRAVENOUS

## 2022-10-21 MED ORDER — SODIUM CHLORIDE 0.9 % WEIGHT BASED INFUSION
1.0000 mL/kg/h | INTRAVENOUS | Status: AC
  Administered 2022-10-21: 1 mL/kg/h via INTRAVENOUS

## 2022-10-21 SURGICAL SUPPLY — 40 items
BALLN COYOTE OTW 2X220X150 (BALLOONS) ×2
BALLN MUSTANG 5X120X135 (BALLOONS) ×2
BALLN STERLING OTW 3X220X150 (BALLOONS) ×2
BALLOON COYOTE OTW 2X220X150 (BALLOONS) IMPLANT
BALLOON MUSTANG 5X120X135 (BALLOONS) IMPLANT
BALLOON STERLING OTW 3X220X150 (BALLOONS) IMPLANT
CATH CXI 2.3F 135 ST (CATHETERS) IMPLANT
CATH CXI 2.6F 65 ST (CATHETERS) ×2
CATH OMNI FLUSH 5F 65CM (CATHETERS) IMPLANT
CATH QUICKCROSS SUPP .035X90CM (MICROCATHETER) IMPLANT
CATH SPRT STRG 65X2.6FR ACPT (CATHETERS) IMPLANT
CATH SYNTRAX .014X135 (CATHETERS) IMPLANT
CLOSURE MYNX CONTROL 6F/7F (Vascular Products) IMPLANT
DEVICE ONE SNARE 10MM (MISCELLANEOUS) IMPLANT
DEVICE TORQUE .025-.038 (MISCELLANEOUS) IMPLANT
GLIDEWIRE ADV .035X260CM (WIRE) IMPLANT
GUIDEWIRE ANGLED .035X260CM (WIRE) IMPLANT
GUIDEWIRE NITREX 0.018X80X5 (WIRE) IMPLANT
KIT ANGIASSIST CO2 SYSTEM (KITS) IMPLANT
KIT ENCORE 26 ADVANTAGE (KITS) IMPLANT
KIT MICROPUNCTURE NIT STIFF (SHEATH) IMPLANT
KIT PV (KITS) ×2 IMPLANT
SHEATH CATAPULT 6FR 45 (SHEATH) IMPLANT
SHEATH MICROPUNCTURE PEDAL 5FR (SHEATH) IMPLANT
SHEATH PINNACLE 5F 10CM (SHEATH) IMPLANT
SHEATH PINNACLE 6F 10CM (SHEATH) IMPLANT
SHEATH PROBE COVER 6X72 (BAG) IMPLANT
STENT ELUVIA 6X150X130 (Permanent Stent) IMPLANT
STENT ELUVIA 6X60X130 (Permanent Stent) IMPLANT
STOPCOCK MORSE 400PSI 3WAY (MISCELLANEOUS) IMPLANT
SYR MEDRAD MARK 7 150ML (SYRINGE) ×2 IMPLANT
TRANSDUCER W/STOPCOCK (MISCELLANEOUS) ×2 IMPLANT
TRAY PV CATH (CUSTOM PROCEDURE TRAY) ×2 IMPLANT
TUBING CIL FLEX 10 FLL-RA (TUBING) IMPLANT
WIRE APROACH 25G .014X300CM (WIRE) IMPLANT
WIRE G V18X300CM (WIRE) IMPLANT
WIRE SHEPHERD 30G .014 (WIRE) IMPLANT
WIRE SHEPHERD 4G .018 (WIRE) IMPLANT
WIRE SPARTACORE .014X300CM (WIRE) IMPLANT
WIRE STARTER BENTSON 035X150 (WIRE) IMPLANT

## 2022-10-21 NOTE — H&P (Addendum)
HPI: Audrey [name] is a 82 y.o. female, who is here today for evaluation.  She reports nonhealing wounds on her right foot.  She does have a small area on the posterior aspect of her right heel and a small area on her fourth toe.  She reports these have been present for months but the heel wound appears to be getting better but no improvement on the fourth toe wound.  She does have very clear-cut arterial rest pain.  She reports that she wakes up with a throbbing sensation in her right foot and has to dangle her foot over the edge of the bed for relief.  She did undergo evaluation at an outpatient vascular lab on 07/28/2022.  She was displeased with her treatment there.  She is concerned regarding only having 1 kidney and mild renal insufficiency and was told that she would not have iodinated contrast but was given this.  Also reportedly from her attempt was made to treat her right leg to her left groin approach and then apparently attempts were made for retrograde access to her posterior tibial on the right.  This was unsuccessful and there was discussion of returning for posterior popliteal approach.  He is seeing me today for another opinion.   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  She remains quite active.       Past Medical History:  Diagnosis Date   Anemia     Arthritis      lower back, hips, hands   Biliary stricture     Diabetes mellitus (HCC)      type 2    Early cataracts, bilateral      Md just watching   Elevated liver enzymes     Family history of adverse reaction to anesthesia      Daughter hard to wake up   Gallstones     GERD (gastroesophageal reflux disease)      occasional - diet controlled   History of blood transfusion 2018   History of hiatal hernia     HTN (hypertension)     Hyperlipidemia     Hypothyroidism     left renal ca dx'd 2018    renal cancer -  left kidney removed, pill chemo x 1 yr   Myocardial infarction (HCC) 1991    no deficits   SVD (spontaneous vaginal delivery)      x 3   Wears glasses             Family History  Problem Relation Age of Onset   Heart attack Father 23   Heart disease Brother 15        CABG   Colon cancer Neg Hx     Esophageal cancer Neg Hx     Inflammatory bowel disease Neg Hx     Liver disease Neg Hx     Pancreatic cancer Neg Hx     Rectal cancer Neg Hx     Stomach cancer Neg Hx        SOCIAL HISTORY: Social History         Socioeconomic History   Marital status: Widowed      Spouse name: Not on file   Number of children: 3   Years of education: Not on file   Highest education level: Not on file  Occupational History   Occupation: retired  Tobacco Use   Smoking status: Former      Packs/day: 1.00      Years: 8.00      Additional pack years: 0.00      Total pack years: 8.00      Types: Cigarettes      Quit date: 06/18/1964      Years since quitting: 58.2   Smokeless tobacco: Never  Vaping Use   Vaping Use: Never used  Substance and Sexual Activity   Alcohol use: No   Drug use: No   Sexual activity: Not Currently      Birth control/protection: Post-menopausal  Other Topics Concern   Not on file  Social History Narrative    Married.  Three children    Social Determinants of Health        Financial Resource Strain: Low Risk  (05/23/2022)    Overall Financial Resource Strain (CARDIA)     Difficulty of Paying Living Expenses: Not hard at all  Food Insecurity: No Food Insecurity (05/23/2022)    Hunger Vital Sign     Worried About Running Out of Food in the Last Year: Never true     Ran Out of Food in the Last Year: Never true  Transportation Needs: No Transportation Needs (05/23/2022)    PRAPARE - Therapist, art (Medical): No     Lack of Transportation (Non-Medical): No  Physical Activity: Not on file  Stress: Not on file  Social Connections: Not  on file  Intimate Partner Violence: Not At Risk (05/23/2022)    Humiliation, Afraid, Rape, and Kick questionnaire     Fear of Current or Ex-Partner: No     Emotionally Abused: No     Physically Abused: No     Sexually Abused: No           Allergies  Allergen Reactions   Doxycycline Nausea And Vomiting   Adhesive [Tape] Other (See Comments)      Tears skin off Paper tape is ok per patient   Duloxetine     Gabapentin (Once-Daily) Other (See Comments)      Feels "loopy"   Nsaids Other (See Comments)      Pt only has one kidney    Statins Other (See Comments)      Liver/kidney issues.            Current Outpatient Medications  Medication Sig Dispense Refill   amLODipine (NORVASC) 5 MG tablet Take 5 mg by mouth daily.    6   atorvastatin (LIPITOR) 80 MG tablet Take 80 mg by mouth at bedtime.        carvedilol (COREG) 6.25 MG tablet Take 6.25 mg by mouth 2 (two) times daily.   6   clobetasol ointment (TEMOVATE) 0.05 % Apply 1 application topically 2 (two) times daily as needed (lichen sclerosis).        clopidogrel (PLAVIX) 75 MG tablet Take 75 mg by mouth daily.       dapagliflozin propanediol (FARXIGA) 10 MG TABS tablet         Denosumab (XGEVA [place]) Inject into the skin every 6 (six) weeks.       diphenhydrAMINE (BENADRYL) 25 MG tablet Take 25 mg by mouth at bedtime.       glimepiride (AMARYL) 1 MG tablet Take 1 mg by mouth daily with breakfast.       hydrocortisone (CORTEF) 10 MG tablet Take 5-10 mg by mouth See admin instructions. Take 1 tablet (  10 mg) by mouth in the morning & 1/2 tablet (5 mg) by mouth in the evening.       levothyroxine (SYNTHROID) 75 MCG tablet Take 75 mcg by mouth daily before breakfast.        lidocaine (LIDODERM) 5 % Place 1 patch onto the skin daily as needed. Remove & Discard patch within 12 hours or as directed by MD 30 patch 1   Nivolumab (OPDIVO IV) Inject 1 Dose into the vein every 14 (fourteen) days. [facility]       omeprazole (PRILOSEC) 20 MG  capsule Take 20 mg by mouth 2 (two) times daily.       repaglinide (PRANDIN) 0.5 MG tablet Take 0.5 mg by mouth 2 (two) times daily.       SSD 1 % cream Apply topically 2 (two) times daily.       traMADol (ULTRAM) 50 MG tablet Take 1 tablet (50 mg total) by mouth every 6 (six) hours as needed. 30 tablet 0    No current facility-administered medications for this visit.      REVIEW OF SYSTEMS:  [X]  denotes positive finding, [ ]  denotes negative finding Cardiac   Comments:  Chest pain or chest pressure:      Shortness of breath upon exertion:      Short of breath when lying flat:      Irregular heart rhythm:             Vascular      Pain in calf, thigh, or hip brought on by ambulation: x    Pain in feet at night that wakes you up from your sleep:  x    Blood clot in your veins: x    Leg swelling:              Pulmonary      Oxygen at home:      Productive cough:       Wheezing:              Neurologic      Sudden weakness in arms or legs:       Sudden numbness in arms or legs:       Sudden onset of difficulty speaking or slurred speech:      Temporary loss of vision in one eye:       Problems with dizziness:              Gastrointestinal      Blood in stool:       Vomited blood:              Genitourinary      Burning when urinating:       Blood in urine:             Psychiatric      Major depression:              Hematologic      Bleeding problems:      Problems with blood clotting too easily:             Skin      Rashes or ulcers:             Constitutional      Fever or chills:          PHYSICAL EXAM: There were no vitals filed for this visit.       GENERAL: The patient is a well-nourished female, in no acute distress.  The vital signs are documented above. CARDIOVASCULAR: 2+ femoral pulses bilaterally.  Absent popliteal and distal pulses. PULMONARY: There is good air exchange  MUSCULOSKELETAL: There are no major deformities or cyanosis. NEUROLOGIC:  No focal weakness or paresthesias are detected. SKIN: Mild less than 1 cm ulcer on the posterior right heel and fourth toe. PSYCHIATRIC: The patient has a normal affect.         DATA:  Noninvasive studies in our office today reveal ankle arm index of 0.29 on the right with dampened monophasic flow and 0.63 on the left with monophasic flow   MEDICAL ISSUES: Had long discussion with the patient regarding her right leg critical limb ischemia and risk for limb loss.  I have reviewed images from her previous intervention and we have discussed proceeding with repeat right lower extremity angiography using CO2.  She demonstrates good understanding with all possible outcomes including need for surgery possible retrograde access.  Yeshaya Vath C. Randie Heinz, MD [facility] Office: [phone] Pager: [phone]

## 2022-10-21 NOTE — Op Note (Signed)
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 Randie Heinz, MD Procedure Performed: 1.  Percutaneous access and closure with Mynx device left common femoral artery 2.  Catheter selection of aorta and aortogram with bilateral lower extremity runoff 3.  Ultrasound guided cannulation right anterior tibial artery with catheter selection of right SFA, right popliteal artery and right anterior tibial artery 4.  Plain balloon angioplasty right anterior tibial artery with 3 mm balloon 5.  Stent of right popliteal and SFA with 6 x 150 mm Eluvia proximal and distal 6 x 60 mm Eluvia 6.  Moderate sedation with fentanyl and Versed 114 minutes  Indications: 82 year old female with toe tip ulceration and severely depressed ABI of the right lower extremity has undergone attempted angiography in [place] and now presents for definitive treatment.  Findings: Aorta and iliac segments are free of flow-limiting stenosis.  By ultrasound there was some posterior calcium in the left common femoral artery but there were strong palpable bilateral common femoral pulses in the bilateral common femoral arteries and profunda femoris and proximal SFAs are patent by angiography.  The right SFA distally at the adductor canal has severe disease and frankly occludes for a long segment reconstitutes an [place] at the popliteal artery and then again at occludes and reconstitutes below the knee popliteal artery with disease at the anterior tibial artery takeoff and dominant runoff via the peroneal artery.  After cannulating the anterior tibial artery in a retrograde fashion we were able to perform through and through access with balloon angioplasty of the anterior tibial artery and  stenting of the SFA and popliteal segments.  At completion the pressure in the anterior tibial artery sheath was 165 mmHg and there was brisk runoff via the anterior tibial artery with a multiphasic signal at the ankle.  Plan will be for patient to continue wound care at the wound care center in [place], [place] we will have her follow-up in 4 to 6 weeks with right lower extremity arterial duplex and ABIs and we can consider treatment of the left lower extremity.   Procedure:  The patient was identified in the holding area and taken to room 8.  The patient was then placed supine on the table and prepped and draped in the usual sterile fashion.  A time out was called.  Ultrasound was used to evaluate the left common femoral artery which was noted to be patent although there was posterior plaque.  The area was anesthetized with 1% lidocaine cannulated with micropuncture needle followed by wire and a micropuncture sheath was placed followed by a Bentson wire.  We then placed a 5 French sheath followed by Omni catheter to the level of L1 and perform CO2 aortogram followed by CO2 pelvic angiography including the bilateral common femoral arteries.  We then crossed the bifurcation with Omni cath and Bentson wire perform right lower extremity angiography initially with CO2 and followed by contrasted imaging.  With the above findings were then placed a long 6 French sheath patient was fully heparinized.  From above I attempted to cross the occluded SFA segment but could not given heavy calcification.  The right ankle was then prepped and draped.  Ultrasound was used to identify the right anterior tibial artery which was very diminutive and this  area was anesthetized with 1% lidocaine and cannulated the anterior tibial artery with a micropuncture needle followed by wire and a micropuncture sheath.  Again and images saved the permanent record.  I placed a V18 wire initially followed by an 018 catheter attempted for  retrograde crossing and I was able to get to the below-knee popliteal artery.  From above and below I was then able to get 014 wires to touch at the level of the popliteal artery and I snared V18 through and through access.  I then performed 2 mm balloon angioplasty from the anterior tibial artery all the way through the occluded SFA and then was able to get a 3 mm balloon down to the anterior tibial artery and perform balloon angioplasty all the way back from the anterior tibial artery diseased area through the occluded SFA and popliteal segments.  I then primarily stented first proximally 6 x 150 mm drug-eluting stent and extended distally with a 6 x 16 drug-eluting stent postdilated with 5 mm balloon.  At this time the pressure of the ankle was 165 mmHg there was brisk flow all the way to the ankle.  The wire was removed and exchanged for a short 6 French sheath put a minx device.  The sheath was removed and the right ankle and pressure was held until hemostasis was obtained.  At completion there were very strong anterior tibial and peroneal signals at the ankle.  She tolerated the procedure without immediate complication.   Contrast: 40 cc  Gillie Fleites C. Randie Heinz, MD [facility] Office: [phone] Pager: [phone]

## 2022-10-22 ENCOUNTER — Encounter (HOSPITAL_COMMUNITY): Payer: Self-pay | Admitting: Vascular Surgery

## 2022-10-22 DIAGNOSIS — E1151 Type 2 diabetes mellitus with diabetic peripheral angiopathy without gangrene: Secondary | ICD-10-CM | POA: Diagnosis not present

## 2022-10-22 LAB — CBC
HCT: 31.9 % — ABNORMAL LOW (ref 36.0–46.0)
Hemoglobin: 10 g/dL — ABNORMAL LOW (ref 12.0–15.0)
MCH: 29.6 pg (ref 26.0–34.0)
MCHC: 31.3 g/dL (ref 30.0–36.0)
MCV: 94.4 fL (ref 80.0–100.0)
Platelets: 209 10*3/uL (ref 150–400)
RBC: 3.38 MIL/uL — ABNORMAL LOW (ref 3.87–5.11)
RDW: 16.8 % — ABNORMAL HIGH (ref 11.5–15.5)
WBC: 10.4 10*3/uL (ref 4.0–10.5)
nRBC: 0 % (ref 0.0–0.2)

## 2022-10-22 LAB — LIPID PANEL
Cholesterol: 115 mg/dL (ref 0–200)
HDL: 21 mg/dL — ABNORMAL LOW (ref >40)
LDL Cholesterol: 51 mg/dL (ref 0–99)
Total CHOL/HDL Ratio: 5.5 RATIO
Triglycerides: 217 mg/dL — ABNORMAL HIGH (ref <150)
VLDL: 43 mg/dL — ABNORMAL HIGH (ref 0–40)

## 2022-10-22 MED ORDER — CLOBETASOL PROPIONATE 0.05 % EX OINT: 1.0000 | TOPICAL_OINTMENT | Freq: Two times a day (BID) | CUTANEOUS | Status: DC | PRN

## 2022-10-22 NOTE — Evaluation (Signed)
Occupational Therapy Evaluation and Discharge Patient Details Name: Audrey [name] MRN: [id number] DOB: [birth year] Today's Date: 10/22/2022   History of Present Illness Audrey [name] is an 82 y.o. female is s/p Aortogram, Arteriogram RLE with balloon angioplasty of R AT, and stent of right pop and SFA due to non healing ulcers/pain on right foot. 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   Clinical Impression   This 82 yo female admitted and underwent above presents to acute OT with all education completed and pt realizing she cannot do her socks and shoes on her own (she will either use AE or have some A). No further OT needs, we will sign off.      Recommendations for follow up therapy are one component of a multi-disciplinary discharge planning process, led by the attending physician.  Recommendations may be updated based on patient status, additional functional criteria and insurance authorization.   Assistance Recommended at Discharge PRN  Patient can return home with the following A little help with walking and/or transfers;A little help with bathing/dressing/bathroom;Assistance with cooking/housework;Help with stairs or ramp for entrance;Assist for transportation    Functional Status Assessment  Patient has had a recent decline in their functional status and demonstrates the ability to make significant improvements in function in a reasonable and predictable amount of time. (without further skilled OT needs)  Equipment Recommendations  None recommended by OT       Precautions / Restrictions Precautions Precautions: Fall Restrictions Weight Bearing Restrictions: No      Mobility Bed Mobility               General bed mobility comments: Pt OOB upon my arrival walking to bathroom with RN    Transfers Overall transfer level: Needs assistance Equipment used: Rolling walker (2  wheels) Transfers: Sit to/from Stand Sit to Stand: Min guard           General transfer comment: has to really concentrate on sitting down and no plopping due to pain, stiffness, swelling in left groin and pubic area      Balance Overall balance assessment: Mild deficits observed, not formally tested                                         ADL either performed or assessed with clinical judgement   ADL Overall ADL's : Needs assistance/impaired Eating/Feeding: Independent;Sitting   Grooming: Set up;Standing   Upper Body Bathing: Set up;Sitting   Lower Body Bathing: Min guard;Sit to/from stand   Upper Body Dressing : Set up;Sitting   Lower Body Dressing: Sit to/from stand;Moderate assistance Lower Body Dressing Details (indicate cue type and reason): min guard A sit<>stand; she said she could use her reacher to get her special shoes off and on but will not be able to do her socks (recommended a sock aid--showed her and son a picture OR someone has to help her do her socks and shoes until she can bend forward enough to do them herself. Toilet Transfer: Min guard;Ambulation;Rolling walker (2 wheels);BSC/3in1 Toilet Transfer Details (indicate cue type and reason): over toilet Toileting- Clothing Manipulation and Hygiene: Min guard;Sit to/from stand     Tub/Shower Transfer Details (indicate cue type and reason): Advised pt to have HHPT practice a "dry run" of her getting into and out of the tub/shower combo  Vision Baseline Vision/History: 1 Wears glasses Ability to See in Adequate Light: 2 Moderately impaired Patient Visual Report: No change from baseline Additional Comments: h/o of poor vision or as the patient states "my vision sucks)            Pertinent Vitals/Pain Pain Assessment Pain Assessment: Faces Faces Pain Scale: Hurts even more Pain Location: Left groin--hurts the most wtih transition movements Pain Descriptors / Indicators:  Sore Pain Intervention(s): Limited activity within patient's tolerance, Monitored during session, Repositioned     Hand Dominance Right   Extremity/Trunk Assessment Upper Extremity Assessment Upper Extremity Assessment: Overall WFL for tasks assessed           Communication Communication Communication: No difficulties   Cognition Arousal/Alertness: Awake/alert Behavior During Therapy: WFL for tasks assessed/performed Overall Cognitive Status: Within Functional Limits for tasks assessed                                                  Home Living Family/patient expects to be discharged to:: Private residence Living Arrangements: Alone Available Help at Discharge: Family;Available 24 hours/day Type of Home: House Home Access: Stairs to enter [employer] of Steps: 1   Home Layout: One level     Bathroom Shower/Tub: IT trainer: Handicapped height     Home Equipment: Shower seat          Prior Functioning/Environment Prior Level of Function : Independent/Modified Independent;Driving                        OT Problem List: Decreased range of motion;Impaired balance (sitting and/or standing);Pain         OT Goals(Current goals can be found in the care plan section) Acute Rehab OT Goals Patient Stated Goal: to go home today OT Goal Formulation: With patient         AM-PAC OT "6 Clicks" Daily Activity     Outcome Measure Help from another person eating meals?: None Help from another person taking care of personal grooming?: None Help from another person toileting, which includes using toliet, bedpan, or urinal?: None Help from another person bathing (including washing, rinsing, drying)?: A Little Help from another person to put on and taking off regular upper body clothing?: None Help from another person to put on and taking off regular lower body clothing?: A Lot 6 Click Score: 21   End  of Session Equipment Utilized During Treatment: Rolling walker (2 wheels) Nurse Communication:  (pt ready to go when RN has her paperwork ready)  Activity Tolerance: Patient tolerated treatment well Patient left: in chair;with call bell/phone within reach;with family/visitor present  OT Visit Diagnosis: Unsteadiness on feet (R26.81);Other abnormalities of gait and mobility (R26.89);Pain Pain - Right/Left: Left Pain - part of body:  (groin)                Time: 1211-1228 OT Time Calculation (min): 17 min Charges:  OT General Charges $OT Visit: 1 Visit OT Evaluation $OT Eval Moderate Complexity: 1 Mod  Cathy L. OT Acute Rehabilitation Services Office [phone]    Evette Georges 10/22/2022, 1:26 PM

## 2022-10-22 NOTE — Plan of Care (Signed)
Discharging to home.  Patient voided 300 ml, after ambulating, with assistance and walker to bathroom. Post void bladder scan 74.

## 2022-10-22 NOTE — Discharge Instructions (Signed)
  [facility]  Discharge Instructions  Lower Extremity Angiogram; Angioplasty/Stenting  Please refer to the following instructions for your post-procedure care. Your surgeon or physician assistant will discuss any changes with you.  Activity  Avoid lifting more than 8 pounds (1 gallons of milk) for 5 days after your procedure. You may walk as much as you can tolerate. It's OK to drive after 72 hours.  Bathing/Showering  You may shower the day after your procedure. If you have a bandage, you may remove it at 24- 48 hours. Clean your incision site with mild soap and water. Pat the area dry with a clean towel.  Diet  Resume your pre-procedure diet. There are no special food restrictions following this procedure. All patients with peripheral vascular disease should follow a low fat/low cholesterol diet. In order to heal from your surgery, it is CRITICAL to get adequate nutrition. Your body requires vitamins, minerals, and protein. Vegetables are the best source of vitamins and minerals. Vegetables also provide the perfect balance of protein. Processed food has little nutritional value, so try to avoid this.  Medications  Resume taking all of your medications unless your doctor tells you not to. If your incision is causing pain, you may take over-the-counter pain relievers such as acetaminophen (Tylenol)  Follow Up  Follow up will be arranged at the time of your procedure. You may have an office visit scheduled or may be scheduled for surgery. Ask your surgeon if you have any questions.  Please call us immediately for any of the following conditions: .Severe or worsening pain your legs or feet at rest or with walking. .Increased pain, redness, drainage at your groin puncture site. .Fever of 101 degrees or higher. .If you have any mild or slow bleeding from your puncture site: lie down, apply firm constant pressure over the area with a piece of gauze or a  clean wash cloth for 30 minutes- no peeking!, call 911 right away if you are still bleeding after 30 minutes, or if the bleeding is heavy and unmanageable.  Reduce your risk factors of vascular disease:  . Stop smoking. If you would like help call [facility] at 1-800-QUIT-NOW (1-800-784-8669) or [facility] at 336-586-4000. . Manage your cholesterol . Maintain a desired weight . Control your diabetes . Keep your blood pressure down .  If you have any questions, please call the office at [phone] 

## 2022-10-22 NOTE — Progress Notes (Addendum)
Progress Note    10/22/2022 7:46 AM 1 Day Post-Op  Subjective:  no major complaints, has not voided yet   Vitals:   10/22/22 0146 10/22/22 0349  BP: (!) 111/47 (!) 105/50  Pulse: 71 73  Resp: 14 18  Temp:  98.3 F (36.8 C)  SpO2: 100% 94%   Physical Exam: Cardiac:  regular Lungs:  non labored Incisions:  left CF access site soft, significant ecchymosis present Extremities:  right lower extremity is well perfused and warm with palpable DP Abdomen:  soft Neurologic: alert and oriented  CBC    Component Value Date/Time   WBC 8.4 10/09/2022 0745   WBC 12.1 (H) 08/15/2021 0751   RBC 4.34 10/09/2022 0745   HGB 15.3 (H) 10/21/2022 0916   HGB 12.6 10/09/2022 0745   HGB 11.2 (L) 04/04/2017 0830   HCT 45.0 10/21/2022 0916   HCT 35.2 04/04/2017 0830   PLT 224 10/09/2022 0745   PLT 250 04/04/2017 0830   MCV 91.9 10/09/2022 0745   MCV 107.6 (H) 04/04/2017 0830   MCH 29.0 10/09/2022 0745   MCHC 31.6 10/09/2022 0745   RDW 16.5 (H) 10/09/2022 0745   RDW 14.0 04/04/2017 0830   LYMPHSABS 1.1 10/09/2022 0745   LYMPHSABS 1.6 04/04/2017 0830   MONOABS 1.1 (H) 10/09/2022 0745   MONOABS 0.4 04/04/2017 0830   EOSABS 0.1 10/09/2022 0745   EOSABS 0.1 04/04/2017 0830   BASOSABS 0.1 10/09/2022 0745   BASOSABS 0.0 04/04/2017 0830    BMET    Component Value Date/Time   NA 141 10/21/2022 0916   NA 140 04/04/2017 0830   K 4.6 10/21/2022 0916   K 4.1 04/04/2017 0830   CL 111 10/21/2022 0916   CO2 18 (L) 10/09/2022 0745   CO2 21 (L) 04/04/2017 0830   GLUCOSE 118 (H) 10/21/2022 0916   GLUCOSE 107 04/04/2017 0830   BUN 39 (H) 10/21/2022 0916   BUN 37.0 (H) 04/04/2017 0830   CREATININE 1.70 (H) 10/21/2022 0916   CREATININE 1.83 (H) 10/09/2022 0745   CREATININE 2.2 (H) 04/04/2017 0830   CALCIUM 8.6 (L) 10/09/2022 0745   CALCIUM 10.1 04/04/2017 0830   GFRNONAA 28 (L) 10/09/2022 0745   GFRAA 30 (L) 01/19/2020 0835    INR    Component Value Date/Time   INR 1.1 09/01/2018  1850     Intake/Output Summary (Last 24 hours) at 10/22/2022 0746 Last data filed at 10/22/2022 0600 Gross per 24 hour  Intake 1132.8 ml  Output 700 ml  Net 432.8 ml     Assessment/Plan:  82 y.o. female is s/p Aortogram, Arteriogram RLE with balloon angioplasty of R AT, and stent of right pop and SFA 1 Day Post-Op   RLE well perfused with palpable AT pulse in foot L CF access site soft, significant ecchymosis present BP soft, improved with fluid bolus. Asymptomatic CBC pending Continue Statin, Plavix Possible discharge home later today if she tolerates ambulating and voids without issues She will follow up in 4-6 weeks with RLE arterial duplex and ABIs  Graceann Congress, PA-C [facility] [phone] 10/22/2022 7:46 AM   I have interviewed and examined patient with PA and agree with assessment and plan above.  Right foot is very well-perfused.  Will plan for discharge after she clears physical therapy as long as her blood pressure is stable today.  Hopefully wounds of the right lower extremity will heal.  Possibly will require left lower extremity angiography in the future due to rest pain.  Daeshawn Redmann C. Donzetta Matters, MD [facility] [place] Office: [phone] Pager: [phone]

## 2022-10-22 NOTE — Discharge Summary (Signed)
Discharge summary  Patient ID: Audrey [name] MRN: [id number] DOB/AGE: 25-Aug-1940 82 y.o.  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  Percutaneous access and closure with Mynx device left common femoral artery 2.  Catheter selection of aorta and aortogram with bilateral lower extremity runoff 3.  Ultrasound guided cannulation right anterior tibial artery with catheter selection of right SFA, right popliteal artery and right anterior tibial artery 4.  Plain balloon angioplasty right anterior tibial artery with 3 mm balloon 5.  Stent of right popliteal and SFA with 6 x 150 mm Eluvia proximal and distal 6 x 60 mm Eluvia 6.  Moderate sedation with fentanyl and Versed 114 minutes    Discharged Condition: good  Hospital Course: 82 year old female was admitted after endovascular intervention of right lower extremity due to pain in the left groin with hematoma.  She initially did have some low blood pressures although was not frankly hypotensive and did respond to fluid.  In the morning her blood pressure was stable and within normal limits she was evaluated by physical therapy and discharged with rolling walker.  Consults: Physical therapy   Significant Diagnostic Studies: CBC    Latest Ref Rng & Units 10/22/2022    7:57 AM 10/21/2022    9:16 AM 10/09/2022    7:45 AM  CBC  WBC 4.0 - 10.5 K/uL 10.4   8.4   Hemoglobin 12.0 - 15.0 g/dL 40.9  81.1  91.4   Hematocrit 36.0 - 46.0 % 31.9  45.0  39.9   Platelets 150 - 400 K/uL 209   224     COAG Lab Results  Component Value Date   INR 1.1 09/01/2018   INR 0.87 01/08/2018   INR 1.04 06/27/2016   No results found for: "PTT"  Disposition: Discharge disposition: 01-Home or Self Care       Discharge  Instructions     Call MD for:  redness, tenderness, or signs of infection (pain, swelling, bleeding, redness, odor or green/yellow discharge around incision site)   Complete by: As directed    Call MD for:  severe or increased pain, loss or decreased feeling  in affected limb(s)   Complete by: As directed    Call MD for:  temperature >100.5   Complete by: As directed    Discharge wound care:   Complete by: As directed    Remove dressing from left groin tomorrow 7/3. Okay to shower and wash area with mild soap and water, pat dry. Do not soak in bathtub   Driving Restrictions   Complete by: As directed    No driving for 1 week   Increase activity slowly   Complete by: As directed    Walk with assistance use walker or cane as needed   Lifting restrictions   Complete by: As directed    No lifting for 2 weeks   Resume previous diet   Complete by: As directed       Allergies as of 10/22/2022       Reactions   Doxycycline Nausea And Vomiting   Adhesive [tape] Other (See Comments)   Tears skin off Paper tape is ok per patient   Duloxetine Other (See Comments)   Feel woozy   Gabapentin (once-daily) Other (See Comments)   Feels "loopy"   Nsaids  Other (See Comments)   Pt only has one kidney    Statins Other (See Comments)   Liver/kidney issues.        Medication List     TAKE these medications    amLODipine 5 MG tablet Commonly known as: NORVASC Take 5 mg by mouth daily.   atorvastatin 80 MG tablet Commonly known as: LIPITOR Take 80 mg by mouth at bedtime.   carvedilol 6.25 MG tablet Commonly known as: COREG Take 6.25 mg by mouth 2 (two) times daily.   clobetasol ointment 0.05 % Commonly known as: TEMOVATE Apply 1 application topically 2 (two) times daily as needed (lichen sclerosis).   clopidogrel 75 MG tablet Commonly known as: PLAVIX Take 75 mg by mouth daily.   dapagliflozin propanediol 10 MG Tabs tablet Commonly known as: FARXIGA Take 10 mg by mouth  daily.   denosumab 120 MG/1.7ML Soln injection Commonly known as: XGEVA Inject 120 mg into the skin every 6 (six) weeks.   diphenhydrAMINE 25 MG tablet Commonly known as: BENADRYL Take 25 mg by mouth at bedtime.   glimepiride 1 MG tablet Commonly known as: AMARYL Take 1 mg by mouth daily with breakfast.   hydrocortisone 10 MG tablet Commonly known as: CORTEF Take 5-10 mg by mouth See admin instructions. Take 1 tablet (10 mg) by mouth in the morning & 1/2 tablet (5 mg) by mouth in the evening.   ipratropium 17 MCG/ACT inhaler Commonly known as: ATROVENT HFA Inhale 2 puffs into the lungs every 6 (six) hours as needed for wheezing.   levothyroxine 75 MCG tablet Commonly known as: SYNTHROID Take 75 mcg by mouth daily before breakfast.   lidocaine 5 % Commonly known as: LIDODERM Place 1 patch onto the skin daily as needed. Remove & Discard patch within 12 hours or as directed by MD What changed: when to take this   lidocaine 4 % cream Commonly known as: LMX Apply 1 Application topically daily as needed (pain). What changed: Another medication with the same name was changed. Make sure you understand how and when to take each.   lidocaine-prilocaine cream Commonly known as: EMLA Apply 1 Application topically every 14 (fourteen) days.   omeprazole 20 MG capsule Commonly known as: PRILOSEC Take 20 mg by mouth daily.   OPDIVO IV Inject 1 Dose into the vein every 14 (fourteen) days. [facility]   Prednisolon-Moxiflox-Bromfenac 1-0.5-0.075 % Soln Place 1 drop into both eyes See admin instructions. 1 drop 1 time daily in the right eye, 1 drop 3 times in the left eye   repaglinide 0.5 MG tablet Commonly known as: PRANDIN Take 0.5 mg by mouth 2 (two) times daily.   SSD 1 % cream Generic drug: silver sulfADIAZINE Apply 1 Application topically daily as needed (Foot infection).   traMADol 50 MG tablet Commonly known as: ULTRAM Take 1 tablet (50 mg total) by mouth every  6 (six) hours as needed.               Durable Medical Equipment  (From admission, onward)           Start     Ordered   10/22/22 1150  For home use only DME Walker rolling  Once       Comments: Youth size  Question Answer Comment  Walker: With 5 Inch Wheels   Patient needs a walker to treat with the following condition Physical deconditioning      10/22/22 1149  Discharge Care Instructions  (From admission, onward)           Start     Ordered   10/22/22 0000  Discharge wound care:       Comments: Remove dressing from left groin tomorrow 7/3. Okay to shower and wash area with mild soap and water, pat dry. Do not soak in bathtub   10/22/22 1231            Follow-up Information     [facility] Follow up.   Why: [facility]- [facility]PT arranged- they will contact you to schedule Contact information: [address] [place] [address] [phone]         [facility] Follow up.   Why: ([facility])- youth rolling walker arranged- to be delivered to room prior to discharge Contact information: [address] [place] [address] [phone]         [facility] Follow up.   Why: 4-6 weeks.The office will call the patient with an appointment Contact information: [address] [place] [address] [phone]                Signed:  Luanna Salk. Randie Heinz, MD [facility] [place] Office: [phone] Pager: [phone]   10/22/2022, 4:45 PM

## 2022-10-22 NOTE — TOC Transition Note (Signed)
Transition of Care (TOC) - CM/SW Discharge Note Donn Pierini RN, BSN Transitions of Care Unit 4E- RN Case Manager See Treatment Team for direct phone #   Patient Details  Name: Audrey [name] MRN: [id number] Date of Birth: 1940-09-01  Transition of Care [facility]) CM/SW Contact:  Darrold Span, RN Phone Number: 10/22/2022, 12:17 PM   Clinical Narrative:    Pt stable for transition home today, son at bedside to transport home.   Per PT eval recommendations for HHPT and DME- youth RW. Orders have been placed.   CM in to speak with pt and offer choice.  Per pt she has used [facility] in past for her spouse- would like to see if they can service. She voiced she has no preference for DME provider- just would like RW delivered to room to take with her.   Address, phone # and PCP all confirmed in epic.   Call made to [facility] liaison for DME need- RW to be delivered to room prior to discharge.   Call made to [facility]- spoke with Lafonda Mosses- they will not be able to see pt until next tues/wed due to staffing. Updated pt and provided list Per CMS guidelines from [employer] website with star ratings (copy placed in shadow chart)- for further choice options. - pt has selected [facility].   Call made to [facility]- referral has been accepted and anticipate start of care within 48hr.   No further TOC needs noted.    Final next level of care: Home w Home Health Services Barriers to Discharge: No Barriers Identified   Patient Goals and CMS Choice CMS Medicare.gov Compare Post Acute Care list provided to:: Patient Choice offered to / list presented to : Patient, Adult Children  Discharge Placement               Home w/ [facility]          Discharge Plan and Services Additional resources added to the After Visit Summary for     Discharge Planning Services: CM Consult Post Acute Care Choice: Durable Medical Equipment, Home Health          DME Arranged: Dan Humphreys  youth DME Agency: [facility] Date DME Agency Contacted: 10/22/22 Time DME Agency Contacted: 1130 Representative spoke with at DME Agency: Zack HH Arranged: PT HH Agency: [facility] Date [facility] Agency Contacted: 10/22/22 Time HH Agency Contacted: 1217 Representative spoke with at [facility] Agency: Judeth Cornfield  Social Determinants of Health (SDOH) Interventions SDOH Screenings   Food Insecurity: No Food Insecurity (05/23/2022)  Housing: Low Risk  (05/23/2022)  Transportation Needs: No Transportation Needs (05/23/2022)  Utilities: Not At Risk (05/23/2022)  Depression (PHQ2-9): Low Risk  (09/16/2019)  Financial Resource Strain: Low Risk  (05/23/2022)  Tobacco Use: Medium Risk (10/22/2022)     Readmission Risk Interventions    10/22/2022   12:17 PM  Readmission Risk Prevention Plan  Medication Screening Complete  Transportation Screening Complete

## 2022-10-22 NOTE — Progress Notes (Addendum)
Discharge instructions reviewed with pt and her son.  Copy of instructions given to pt. No new scripts.  Pt asked about reddened, moist area to her left abdominal fold that she had prior to coming in for her procedure.  Area appears to be "yeast", pt has a bottle of antifungal powder in the room, instructed pt to wash the area with mild soap and water, pat dry and dust the antifungal powder over the area at least morning and night, more frequent if area becomes moist/wet. Pt also advised if the area dosen't improve with this antifungal powder, to see her PCP.  Pt verbalized understanding of instructions for post procedure care and skin care.  Pt waiting for her RW to arrive, message sent to unit CM Kristi to find out when the walker should arrive. Pt may go to discharge lounge with son while waiting for the RW pending information obtained from the CM on the ETA of the RW.    Sara Keys,RN SWOT    At 1415--pt's youth walker has arrived, pt d/c'd via wheelchair with her belongings and RW. Escorted by staff.

## 2022-10-22 NOTE — Care Management Obs Status (Signed)
MEDICARE OBSERVATION STATUS NOTIFICATION   Patient Details  Name: Audrey [name] MRN: [id number] Date of Birth: 1940/07/08   Medicare Observation Status Notification Given:  Yes    Darrold Span, RN 10/22/2022, 11:46 AM

## 2022-10-22 NOTE — Progress Notes (Signed)
Amion stated Dr. Edilia Bo was on call.Attempted to reach him at 20:25 , 21:02 21:56. Patient had not voided since 9 a.m. and felt like she needed to and could not. She stated , I feel a little tight in my abdo. ." Janann August. aware and Dr. Edilia Bo return call after his answering service was called to page him @  22:05 In and out cath x 1 ordered . He was also aware of today's events and low BP cont. I.V. fluids  til seen by M.D. in A.M. per Dr. Edilia Bo orders.

## 2022-10-22 NOTE — Progress Notes (Addendum)
   10/22/22 1000  Vitals  BP (!) 124/57  MAP (mmHg) 77  BP Location Right Arm  BP Method Automatic  Patient Position (if appropriate) Standing  Pulse Rate 83  ECG Heart Rate 83  Resp 18  Oxygen Therapy  SpO2 98 %  O2 Device Room Air   Patient ambulated and maintained BP.

## 2022-10-22 NOTE — Evaluation (Signed)
Physical Therapy Evaluation Patient Details Name: Audrey [name] MRN: [id number] DOB: 04/22/1941 Today's Date: 10/22/2022  History of Present Illness  Ms Cobo is an 82 y.o. female is s/p Aortogram, Arteriogram RLE with balloon angioplasty of R AT, and stent of right pop and SFA due to non healing ulcers/pain on right foot. 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  Clinical Impression  PTA pt living alone in single story home with 1 step to enter. Pt reports independence with ambulation and ADLs and iADLs. Pt is currently limited in safe mobility by L groin pain, and bilateral calf cramping. Pt is min A for bed mobility, transfers and ambulation with RW in hallway. Pt benefits from donning shoes for OOB to provided enhanced stability. Requires min A for donning. PT recommends HHPT at discharge to improved gait and balance. PT will continue to follow acutely.         Assistance Recommended at Discharge Frequent or constant Supervision/Assistance  If plan is discharge home, recommend the following:  Can travel by private vehicle  A little help with bathing/dressing/bathroom;A little help with walking and/or transfers;Assistance with cooking/housework;Assist for transportation;Help with stairs or ramp for entrance        Equipment Recommendations Rolling walker (2 wheels) (Youth)     Functional Status Assessment Patient has had a recent decline in their functional status and demonstrates the ability to make significant improvements in function in a reasonable and predictable amount of time.     Precautions / Restrictions Precautions Precautions: Fall Restrictions Weight Bearing Restrictions: No      Mobility  Bed Mobility Overal bed mobility: Needs Assistance Bed Mobility: Supine to Sit     Supine to sit: Min assist     General bed mobility comments: min A and maximal cuing for sequencing     Transfers Overall transfer level: Needs assistance Equipment used: Rolling walker (2 wheels) Transfers: Sit to/from Stand Sit to Stand: Min assist           General transfer comment: vc for hand placement on bed to push off, good power up min A for steadying in standing. pt with decreased eccentric control with sitting due to pain and swelling in groin, had pt stand and practice mored controlled lowering on second attempt.    Ambulation/Gait Ambulation/Gait assistance: Min assist, Min guard Gait Distance (Feet): 60 Feet Assistive device: Rolling walker (2 wheels) Gait Pattern/deviations: Step-to pattern, Decreased step length - left, Decreased stance time - right, Decreased weight shift to right, Antalgic Gait velocity: slowed Gait velocity interpretation: <1.8 ft/sec, indicate of risk for recurrent falls   General Gait Details: minA for steadying progressing to min guard for safety, vc for proximity to RW, and looking up and out      Balance Overall balance assessment: Needs assistance Sitting-balance support: No upper extremity supported, Feet supported, Feet unsupported Sitting balance-Leahy Scale: Fair     Standing balance support: Single extremity supported, Bilateral upper extremity supported, During functional activity, Reliant on assistive device for balance Standing balance-Leahy Scale: Poor                               Pertinent Vitals/Pain Pain Assessment Pain Assessment: Faces Faces Pain Scale: Hurts even more Pain Location: Left groin--hurts the most wtih transition movements Pain Descriptors / Indicators: Sore Pain Intervention(s): Limited activity within patient's tolerance, Monitored  during session, Repositioned    Home Living Family/patient expects to be discharged to:: Private residence Living Arrangements: Alone Available Help at Discharge: Family;Available 24 hours/day Type of Home: House Home Access: Stairs to enter   [employer] of Steps: 1   Home Layout: One level Home Equipment: Shower seat      Prior Function Prior Level of Function : Independent/Modified Independent;Driving                     Hand Dominance   Dominant Hand: Right    Extremity/Trunk Assessment   Upper Extremity Assessment Upper Extremity Assessment: Defer to OT evaluation    Lower Extremity Assessment Lower Extremity Assessment: LLE deficits/detail;RLE deficits/detail RLE Deficits / Details: calf cramping liminting knee and ankle ROM strength grossly 3+/5 RLE Sensation: decreased light touch LLE Deficits / Details: L groin hematoma limiting hip ROM. calf cramping liminting knee and ankle ROM strength grossly 3+/5 LLE Sensation: decreased light touch    Cervical / Trunk Assessment Cervical / Trunk Assessment: Kyphotic  Communication   Communication: No difficulties  Cognition Arousal/Alertness: Awake/alert Behavior During Therapy: WFL for tasks assessed/performed Overall Cognitive Status: Within Functional Limits for tasks assessed                                          General Comments General comments (skin integrity, edema, etc.): son present during session, states friend and family will be able to be with her at discharge. VSS on RA        Assessment/Plan    PT Assessment Patient needs continued PT services  PT Problem List Decreased strength;Decreased range of motion;Decreased activity tolerance;Decreased balance;Decreased mobility;Pain       PT Treatment Interventions DME instruction;Gait training;Functional mobility training;Therapeutic activities;Therapeutic exercise;Balance training;Cognitive remediation    PT Goals (Current goals can be found in the Care Plan section)  Acute Rehab PT Goals Patient Stated Goal: be independent PT Goal Formulation: With patient/family Time For Goal Achievement: 11/05/22 Potential to Achieve Goals: Good    Frequency Min 1X/week         AM-PAC PT "6 Clicks" Mobility  Outcome Measure Help needed turning from your back to your side while in a flat bed without using bedrails?: None Help needed moving from lying on your back to sitting on the side of a flat bed without using bedrails?: A Little Help needed moving to and from a bed to a chair (including a wheelchair)?: A Little Help needed standing up from a chair using your arms (e.g., wheelchair or bedside chair)?: A Little Help needed to walk in hospital room?: A Little Help needed climbing 3-5 steps with a railing? : A Lot 6 Click Score: 18    End of Session Equipment Utilized During Treatment: Gait belt Activity Tolerance: Patient limited by pain Patient left: in chair;with call bell/phone within reach;with chair alarm set Nurse Communication: Mobility status PT Visit Diagnosis: Unsteadiness on feet (R26.81);Other abnormalities of gait and mobility (R26.89);Muscle weakness (generalized) (M62.81);Difficulty in walking, not elsewhere classified (R26.2);Pain Pain - part of body:  (L groin, L and R calf)    Time: 1610-9604 PT Time Calculation (min) (ACUTE ONLY): 40 min   Charges:   PT Evaluation $PT Eval Moderate Complexity: 1 Mod PT Treatments $Gait Training: 8-22 mins PT General Charges $$ ACUTE PT VISIT: 1 Visit         Lanora Manis  Karma Ganja PT, DPT Acute Rehabilitation Services Please use secure chat or  Call Office [phone]   Elon Alas [facility] 10/22/2022, 2:28 PM

## 2022-10-22 NOTE — Care Management CC44 (Signed)
Condition Code 44 Documentation Completed  Patient Details  Name: Audrey [name] MRN: [id number] Date of Birth: [birth year]   Condition Code 44 given:  Yes Patient signature on Condition Code 44 notice:  Yes Documentation of 2 MD's agreement:  Yes Code 44 added to claim:  Yes    Darrold Span, RN 10/22/2022, 11:46 AM

## 2022-10-23 ENCOUNTER — Inpatient Hospital Stay: Payer: Medicare Other

## 2022-10-23 ENCOUNTER — Inpatient Hospital Stay: Payer: Medicare Other | Admitting: Hematology

## 2022-10-24 ENCOUNTER — Other Ambulatory Visit: Payer: Self-pay

## 2022-10-28 ENCOUNTER — Telehealth: Payer: Self-pay | Admitting: Vascular Surgery

## 2022-10-28 NOTE — Telephone Encounter (Signed)
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 Randie Heinz, MD Procedure Performed: 1.  Percutaneous access and closure with Mynx device left common femoral artery 2.  Catheter selection of aorta and aortogram with bilateral lower extremity runoff 3.  Ultrasound guided cannulation right anterior tibial artery with catheter selection of right SFA, right popliteal artery and right anterior tibial artery 4.  Plain balloon angioplasty right anterior tibial artery with 3 mm balloon 5.  Stent of right popliteal and SFA with 6 x 150 mm Eluvia proximal and distal 6 x 60 mm Eluvia 6.  Moderate sedation with fentanyl and Versed 114 minutes  Please have patient follow-up with me or PA in 4 to 6 weeks with right lower extremity arterial duplex and ABIs.  Audrey [name]

## 2022-10-28 NOTE — Telephone Encounter (Signed)
Post op has been scheduled

## 2022-10-29 ENCOUNTER — Other Ambulatory Visit: Payer: Self-pay

## 2022-10-30 ENCOUNTER — Telehealth: Payer: Self-pay | Admitting: Hematology

## 2022-10-30 ENCOUNTER — Encounter: Payer: Self-pay | Admitting: Hematology

## 2022-10-30 ENCOUNTER — Other Ambulatory Visit: Payer: Self-pay

## 2022-10-30 NOTE — Telephone Encounter (Signed)
Called patient twice and left a message also tried reaching patient contacts twice and left message; Left patient and patient contacts a message regarding appointments dates/times

## 2022-11-04 ENCOUNTER — Other Ambulatory Visit: Payer: Self-pay

## 2022-11-04 DIAGNOSIS — C7951 Secondary malignant neoplasm of bone: Secondary | ICD-10-CM

## 2022-11-06 ENCOUNTER — Inpatient Hospital Stay: Payer: Medicare Other | Attending: Hematology

## 2022-11-06 ENCOUNTER — Inpatient Hospital Stay: Payer: Medicare Other

## 2022-11-06 ENCOUNTER — Inpatient Hospital Stay (HOSPITAL_BASED_OUTPATIENT_CLINIC_OR_DEPARTMENT_OTHER): Payer: Medicare Other | Admitting: Physician Assistant

## 2022-11-06 VITALS — BP 148/91 | HR 75 | Temp 98.1°F | Resp 18 | Ht <= 58 in | Wt 119.0 lb

## 2022-11-06 DIAGNOSIS — Z5112 Encounter for antineoplastic immunotherapy: Secondary | ICD-10-CM | POA: Insufficient documentation

## 2022-11-06 DIAGNOSIS — C7802 Secondary malignant neoplasm of left lung: Secondary | ICD-10-CM | POA: Diagnosis not present

## 2022-11-06 DIAGNOSIS — E1151 Type 2 diabetes mellitus with diabetic peripheral angiopathy without gangrene: Secondary | ICD-10-CM | POA: Insufficient documentation

## 2022-11-06 DIAGNOSIS — Z95828 Presence of other vascular implants and grafts: Secondary | ICD-10-CM

## 2022-11-06 DIAGNOSIS — C7951 Secondary malignant neoplasm of bone: Secondary | ICD-10-CM | POA: Diagnosis not present

## 2022-11-06 DIAGNOSIS — R309 Painful micturition, unspecified: Secondary | ICD-10-CM | POA: Insufficient documentation

## 2022-11-06 DIAGNOSIS — Z79620 Long term (current) use of immunosuppressive biologic: Secondary | ICD-10-CM | POA: Diagnosis not present

## 2022-11-06 DIAGNOSIS — Z87891 Personal history of nicotine dependence: Secondary | ICD-10-CM | POA: Insufficient documentation

## 2022-11-06 DIAGNOSIS — C7971 Secondary malignant neoplasm of right adrenal gland: Secondary | ICD-10-CM | POA: Diagnosis not present

## 2022-11-06 DIAGNOSIS — C7801 Secondary malignant neoplasm of right lung: Secondary | ICD-10-CM | POA: Diagnosis not present

## 2022-11-06 DIAGNOSIS — C642 Malignant neoplasm of left kidney, except renal pelvis: Secondary | ICD-10-CM | POA: Insufficient documentation

## 2022-11-06 DIAGNOSIS — C7972 Secondary malignant neoplasm of left adrenal gland: Secondary | ICD-10-CM | POA: Diagnosis not present

## 2022-11-06 LAB — CBC WITH DIFFERENTIAL (CANCER CENTER ONLY)
Abs Immature Granulocytes: 0.23 K/uL — ABNORMAL HIGH (ref 0.00–0.07)
Basophils Absolute: 0.1 K/uL (ref 0.0–0.1)
Basophils Relative: 1 %
Eosinophils Absolute: 0.1 K/uL (ref 0.0–0.5)
Eosinophils Relative: 1 %
HCT: 34.5 % — ABNORMAL LOW (ref 36.0–46.0)
Hemoglobin: 11.2 g/dL — ABNORMAL LOW (ref 12.0–15.0)
Immature Granulocytes: 2 %
Lymphocytes Relative: 11 %
Lymphs Abs: 1.1 K/uL (ref 0.7–4.0)
MCH: 29.7 pg (ref 26.0–34.0)
MCHC: 32.5 g/dL (ref 30.0–36.0)
MCV: 91.5 fL (ref 80.0–100.0)
Monocytes Absolute: 0.9 K/uL (ref 0.1–1.0)
Monocytes Relative: 9 %
Neutro Abs: 7.6 K/uL (ref 1.7–7.7)
Neutrophils Relative %: 76 %
Platelet Count: 315 K/uL (ref 150–400)
RBC: 3.77 MIL/uL — ABNORMAL LOW (ref 3.87–5.11)
RDW: 17.2 % — ABNORMAL HIGH (ref 11.5–15.5)
WBC Count: 9.9 K/uL (ref 4.0–10.5)
nRBC: 0 % (ref 0.0–0.2)

## 2022-11-06 LAB — CMP (CANCER CENTER ONLY)
ALT: 12 U/L (ref 0–44)
AST: 14 U/L — ABNORMAL LOW (ref 15–41)
Albumin: 3.7 g/dL (ref 3.5–5.0)
Alkaline Phosphatase: 60 U/L (ref 38–126)
Anion gap: 10 (ref 5–15)
BUN: 35 mg/dL — ABNORMAL HIGH (ref 8–23)
CO2: 20 mmol/L — ABNORMAL LOW (ref 22–32)
Calcium: 9.7 mg/dL (ref 8.9–10.3)
Chloride: 107 mmol/L (ref 98–111)
Creatinine: 1.71 mg/dL — ABNORMAL HIGH (ref 0.44–1.00)
GFR, Estimated: 30 mL/min — ABNORMAL LOW
Glucose, Bld: 105 mg/dL — ABNORMAL HIGH (ref 70–99)
Potassium: 4.3 mmol/L (ref 3.5–5.1)
Sodium: 137 mmol/L (ref 135–145)
Total Bilirubin: 0.6 mg/dL (ref 0.3–1.2)
Total Protein: 6.3 g/dL — ABNORMAL LOW (ref 6.5–8.1)

## 2022-11-06 LAB — TSH: TSH: 1.651 u[IU]/mL (ref 0.350–4.500)

## 2022-11-06 MED ORDER — SODIUM CHLORIDE 0.9 % IV SOLN: Freq: Once | INTRAVENOUS | Status: AC

## 2022-11-06 MED ORDER — DENOSUMAB 120 MG/1.7ML ~~LOC~~ SOLN
120.0000 mg | Freq: Once | SUBCUTANEOUS | Status: AC
  Administered 2022-11-06: 120 mg via SUBCUTANEOUS
  Filled 2022-11-06: qty 1.7

## 2022-11-06 MED ORDER — SODIUM CHLORIDE 0.9% FLUSH
10.0000 mL | Freq: Once | INTRAVENOUS | Status: AC
  Administered 2022-11-06: 10 mL

## 2022-11-06 MED ORDER — HEPARIN SOD (PORK) LOCK FLUSH 100 UNIT/ML IV SOLN
500.0000 [IU] | Freq: Once | INTRAVENOUS | Status: AC | PRN
  Administered 2022-11-06: 500 [IU]

## 2022-11-06 MED ORDER — SODIUM CHLORIDE 0.9 % IV SOLN
240.0000 mg | Freq: Once | INTRAVENOUS | Status: AC
  Administered 2022-11-06: 240 mg via INTRAVENOUS
  Filled 2022-11-06: qty 24

## 2022-11-06 MED ORDER — SODIUM CHLORIDE 0.9% FLUSH
10.0000 mL | INTRAVENOUS | Status: DC | PRN
  Administered 2022-11-06: 10 mL

## 2022-11-07 ENCOUNTER — Encounter: Payer: Self-pay | Admitting: Hematology

## 2022-11-07 NOTE — Progress Notes (Signed)
[facility] Cancer Follow up:   DOS: 11/06/22  PCP Exie Parody, MD [address] [place] [address]  DIAGNOSIS: metastatic renal cell carcinoma   SUMMARY OF ONCOLOGIC HISTORY: Oncology History  Cancer, 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HCC)  01/10/2017 Initial Diagnosis   Cancer, 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HCC)   01/02/2018 - 01/16/2022 Chemotherapy   Patient is on Treatment Plan : RENAL CELL CARCINOMA NIVOLUMAB Q14D     01/22/2022 Cancer Staging   Staging form: Bone - Appendicular Skeleton, Trunk, Skull, and Facial Bones, AJCC 8th Edition - Clinical: Stage IVB (pM1b) - Signed by Johney Maine, MD on 01/22/2022   02/01/2022 -  Chemotherapy   Patient is on Treatment Plan : RENAL CELL CARCINOMA Nivolumab (240) q14d/     Renal cell carcinoma, left (HCC)  04/11/2017 Initial Diagnosis   Renal cell carcinoma (HCC)   01/02/2018 - 01/16/2022 Chemotherapy   The patient had nivolumab (OPDIVO) 240 mg in sodium chloride 0.9 % 100 mL chemo infusion, 240 mg, Intravenous, Once, 47 of 50 cycles Administration: 240 mg (01/02/2018), 240 mg (01/15/2018), 240 mg (01/30/2018), 240 mg (02/13/2018), 240 mg (02/27/2018), 240 mg (03/13/2018), 240 mg (03/27/2018), 240 mg (04/10/2018), 240 mg (04/24/2018), 240 mg (05/08/2018), 240 mg (05/22/2018), 240 mg (06/05/2018), 240 mg (06/19/2018), 240 mg (08/28/2018), 240 mg (09/11/2018), 240 mg (10/09/2018), 240 mg (10/22/2018), 240 mg (11/06/2018), 240 mg (11/20/2018), 240 mg (12/04/2018), 240 mg (12/18/2018), 240 mg (01/01/2019), 240 mg (01/15/2019), 240 mg (01/29/2019), 240 mg (02/12/2019), 240 mg (02/26/2019), 240 mg (03/12/2019), 240 mg (03/26/2019), 240 mg (04/09/2019), 240 mg (04/28/2019), 240 mg (05/12/2019), 240 mg (05/27/2019), 240 mg (06/09/2019), 240 mg (06/23/2019), 240 mg (07/07/2019), 240 mg (07/21/2019), 240 mg (08/04/2019), 240 mg (08/18/2019), 240 mg (09/03/2019), 240 mg (09/15/2019), 240 mg (09/29/2019), 240 mg (10/12/2019), 240 mg (10/27/2019), 240 mg (11/10/2019), 240 mg  (12/08/2019), 240 mg (12/22/2019), 240 mg (01/19/2020)  for chemotherapy treatment.    02/01/2022 -  Chemotherapy   Patient is on Treatment Plan : RENAL CELL CARCINOMA Nivolumab (240) q14d/       CURRENT THERAPY: Nivolumab q 14 days, Xgeva q 6 weeks  INTERVAL HISTORY:  Audrey [name] 82 y.o. female is here for continued evaluation and management of metastatic renal cell carcinoma. She is here to continue her Nivolumab fherapy.Patient was last seen by Dr. Candise Che on 08/28/2022. She presents to the clinic unaccompanied.   Audrey [name] reports that she is recovering from her recent hospitalization for right lower extremity ischemia s/p stent placement of popliteal and SFA. Audrey [name] reports her energy levels declined after hospitalization and is slowly improving. Her appetite is unchanged. She denies any GI symptoms such as nausea, vomiting, diarrhea or constipation. She deneies fevers, chills, sweats, shortness of breath, chest pain or cough. She has no other complaints.  Patient Active Problem List   Diagnosis Date Noted   PAD (peripheral artery disease) (HCC) 10/21/2022   Atrophy of thyroid (acquired) 01/15/2022   At risk for falls 01/15/2022   CKD (chronic kidney disease) 01/15/2022   Diabetic ulcer of foot with fat layer exposed (HCC) 01/15/2022   Dizziness 01/15/2022   Dysuria 01/15/2022   Knee osteoarthritis 01/15/2022   Pulmonary nodule 01/15/2022   Type 2 diabetes mellitus (HCC) 08/15/2021   Incisional hernia 06/23/2020   Gastritis and gastroduodenitis 02/26/2020   Rectal discomfort 02/26/2020   History of colonic polyps 02/26/2020   Ventral hernia 11/10/2019   History of cholecystectomy 07/17/2019   Degeneration of lumbar intervertebral disc  11/12/2018   Anemia 10/10/2018   Chronic cholecystitis 09/04/2018   Acute cholecystitis 08/29/2018   Biliary stricture 08/23/2018   Medicare annual wellness visit, subsequent 08/14/2018   CAD (coronary artery disease) 08/14/2018   Dyslipidemia  08/14/2018   Abnormal LFTs 08/05/2018   Choledocholithiasis 08/05/2018   Dilated bile duct 08/05/2018   History of ERCP 08/05/2018   History of renal cell carcinoma 08/05/2018   Port-A-Cath in place 01/30/2018   Counseling regarding advance care planning and goals of care 01/04/2018   Renal cell carcinoma, left (HCC) 04/11/2017   Cancer, 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HCC) 01/10/2017   Left renal mass 08/01/2016   Hyperlipidemia 05/23/2015   Essential hypertension 05/23/2015   Hypothyroidism 05/23/2015    is allergic to doxycycline, adhesive [tape], duloxetine, gabapentin (once-daily), nsaids, and statins.  MEDICAL HISTORY: Past Medical History:  Diagnosis Date   Anemia    Arthritis    lower back, hips, hands   Biliary stricture    Diabetes mellitus (HCC)    type 2    Early cataracts, bilateral    Md just watching   Elevated liver enzymes    Family history of adverse reaction to anesthesia    Daughter hard to wake up   Gallstones    GERD (gastroesophageal reflux disease)    occasional - diet controlled   History of blood transfusion 2018   History of hiatal hernia    HTN (hypertension)    Hyperlipidemia    Hypothyroidism    left renal ca dx'd 2018   renal cancer - left kidney removed, pill chemo x 1 yr   Myocardial infarction (HCC) 1991   no deficits   SVD (spontaneous vaginal delivery)    x 3   Wears glasses     SURGICAL HISTORY: Past Surgical History:  Procedure Laterality Date   ABDOMINAL AORTOGRAM W/LOWER EXTREMITY N/A 10/21/2022   Procedure: ABDOMINAL AORTOGRAM W/LOWER EXTREMITY;  Surgeon: Maeola Harman, MD;  Location: [facility] INVASIVE CV LAB;  Service: Cardiovascular;  Laterality: N/A;   BALLOON DILATION N/A 07/23/2018   Procedure: BALLOON DILATION;  Surgeon: Meridee Score Netty Starring., MD;  Location: [facility] ENDOSCOPY;  Service: Gastroenterology;  Laterality: N/A;   BILIARY BRUSHING  08/10/2018   Procedure: BILIARY BRUSHING;  Surgeon: Meridee Score Netty Starring., MD;   Location: [facility] ENDOSCOPY;  Service: Gastroenterology;;   BILIARY BRUSHING  11/30/2018   Procedure: BILIARY BRUSHING;  Surgeon: Lemar Lofty., MD;  Location: [facility] ENDOSCOPY;  Service: Gastroenterology;;   BILIARY DILATION  08/10/2018   Procedure: BILIARY DILATION;  Surgeon: Lemar Lofty., MD;  Location: [facility] ENDOSCOPY;  Service: Gastroenterology;;   BILIARY DILATION  08/27/2018   Procedure: BILIARY DILATION;  Surgeon: Lemar Lofty., MD;  Location: [facility] ENDOSCOPY;  Service: Gastroenterology;;   BILIARY DILATION  01/24/2020   Procedure: BILIARY DILATION;  Surgeon: Lemar Lofty., MD;  Location: [facility] ENDOSCOPY;  Service: Gastroenterology;;   BILIARY STENT PLACEMENT  08/10/2018   Procedure: BILIARY STENT PLACEMENT;  Surgeon: Lemar Lofty., MD;  Location: [facility] ENDOSCOPY;  Service: Gastroenterology;;   BILIARY STENT PLACEMENT  08/27/2018   Procedure: BILIARY STENT PLACEMENT;  Surgeon: Lemar Lofty., MD;  Location: [facility] ENDOSCOPY;  Service: Gastroenterology;;   BILIARY STENT PLACEMENT  11/30/2018   Procedure: BILIARY STENT PLACEMENT;  Surgeon: Lemar Lofty., MD;  Location: [facility] ENDOSCOPY;  Service: Gastroenterology;;   BIOPSY  07/23/2018   Procedure: BIOPSY;  Surgeon: Lemar Lofty., MD;  Location: [facility] ENDOSCOPY;  Service: Gastroenterology;;   BIOPSY  01/24/2020  Procedure: BIOPSY;  Surgeon: Lemar Lofty., MD;  Location: [facility] ENDOSCOPY;  Service: Gastroenterology;;   CHOLECYSTECTOMY N/A 09/02/2018   Procedure: LAPAROSCOPIC CHOLECYSTECTOMY;  Surgeon: Almond Lint, MD;  Location: [facility] OR;  Service: General;  Laterality: N/A;   COLONOSCOPY     normal    ENDOSCOPIC RETROGRADE CHOLANGIOPANCREATOGRAPHY (ERCP) WITH PROPOFOL N/A 08/10/2018   Procedure: ENDOSCOPIC RETROGRADE CHOLANGIOPANCREATOGRAPHY (ERCP) WITH PROPOFOL;  Surgeon: Lemar Lofty., MD;  Location: [facility] ENDOSCOPY;  Service: Gastroenterology;  Laterality: N/A;   ENDOSCOPIC RETROGRADE  CHOLANGIOPANCREATOGRAPHY (ERCP) WITH PROPOFOL N/A 11/30/2018   Procedure: ENDOSCOPIC RETROGRADE CHOLANGIOPANCREATOGRAPHY (ERCP) WITH PROPOFOL;  Surgeon: Meridee Score Netty Starring., MD;  Location: [facility] ENDOSCOPY;  Service: Gastroenterology;  Laterality: N/A;   ENDOSCOPIC RETROGRADE CHOLANGIOPANCREATOGRAPHY (ERCP) WITH PROPOFOL N/A 01/24/2020   Procedure: ENDOSCOPIC RETROGRADE CHOLANGIOPANCREATOGRAPHY (ERCP) WITH PROPOFOL;  Surgeon: Meridee Score Netty Starring., MD;  Location: [facility] ENDOSCOPY;  Service: Gastroenterology;  Laterality: N/A;   ERCP N/A 07/23/2018   Procedure: ENDOSCOPIC RETROGRADE CHOLANGIOPANCREATOGRAPHY (ERCP);  Surgeon: Lemar Lofty., MD;  Location: [facility] ENDOSCOPY;  Service: Gastroenterology;  Laterality: N/A;   ERCP N/A 08/27/2018   Procedure: ENDOSCOPIC RETROGRADE CHOLANGIOPANCREATOGRAPHY (ERCP);  Surgeon: Lemar Lofty., MD;  Location: [facility] ENDOSCOPY;  Service: Gastroenterology;  Laterality: N/A;   ESOPHAGOGASTRODUODENOSCOPY N/A 01/24/2020   Procedure: ESOPHAGOGASTRODUODENOSCOPY (EGD);  Surgeon: Lemar Lofty., MD;  Location: [facility] ENDOSCOPY;  Service: Gastroenterology;  Laterality: N/A;   ESOPHAGOGASTRODUODENOSCOPY (EGD) WITH PROPOFOL N/A 08/27/2018   Procedure: ESOPHAGOGASTRODUODENOSCOPY (EGD) WITH PROPOFOL;  Surgeon: Meridee Score Netty Starring., MD;  Location: [facility] ENDOSCOPY;  Service: Gastroenterology;  Laterality: N/A;   EUS N/A 08/27/2018   Procedure: ESOPHAGEAL ENDOSCOPIC ULTRASOUND (EUS) RADIAL;  Surgeon: Meridee Score Netty Starring., MD;  Location: [facility] ENDOSCOPY;  Service: Gastroenterology;  Laterality: N/A;   FINE NEEDLE ASPIRATION  08/27/2018   Procedure: FINE NEEDLE ASPIRATION (FNA) LINEAR;  Surgeon: Lemar Lofty., MD;  Location: [facility] ENDOSCOPY;  Service: Gastroenterology;;   Sherald Hess HERNIA REPAIR N/A 06/23/2020   Procedure: OPEN INCISIONAL HERNIA REPAIR WITH MESH;  Surgeon: Quentin Ore, MD;  Location: [facility] ORS;  Service: General;  Laterality: N/A;  ROOM 2 STARTING AT  11:00AM FOR 90 MIN   IR IMAGING GUIDED PORT INSERTION  01/08/2018   LAPAROSCOPIC NEPHRECTOMY Left 08/01/2016   Procedure: LAPAROSCOPIC  RADICAL NEPHRECTOMY/ REPAIR OF UMBILICAL HERNIA;  Surgeon: Heloise Purpura, MD;  Location: [facility] ORS;  Service: Urology;  Laterality: Left;   PERIPHERAL VASCULAR INTERVENTION  10/21/2022   Procedure: PERIPHERAL VASCULAR INTERVENTION;  Surgeon: Maeola Harman, MD;  Location: [facility] INVASIVE CV LAB;  Service: Cardiovascular;;   REMOVAL OF STONES  07/23/2018   Procedure: REMOVAL OF GALL STONES;  Surgeon: Meridee Score Netty Starring., MD;  Location: [facility] ENDOSCOPY;  Service: Gastroenterology;;   REMOVAL OF STONES  08/10/2018   Procedure: REMOVAL OF STONES;  Surgeon: Lemar Lofty., MD;  Location: [facility] ENDOSCOPY;  Service: Gastroenterology;;   REMOVAL OF STONES  08/27/2018   Procedure: REMOVAL OF STONES;  Surgeon: Lemar Lofty., MD;  Location: [facility] ENDOSCOPY;  Service: Gastroenterology;;   REMOVAL OF STONES  01/24/2020   Procedure: REMOVAL OF STONES;  Surgeon: Lemar Lofty., MD;  Location: [facility] ENDOSCOPY;  Service: Gastroenterology;;   Dennison Mascot  07/23/2018   Procedure: Dennison Mascot;  Surgeon: Lemar Lofty., MD;  Location: [facility] ENDOSCOPY;  Service: Gastroenterology;;   Francine Graven REMOVAL  08/27/2018   Procedure: STENT REMOVAL;  Surgeon: Lemar Lofty., MD;  Location: [facility] ENDOSCOPY;  Service: Gastroenterology;;   STENT REMOVAL  11/30/2018   Procedure: STENT REMOVAL;  Surgeon:  Mansouraty, Netty Starring., MD;  Location: [facility] ENDOSCOPY;  Service: Gastroenterology;;   Francine Graven REMOVAL  01/24/2020   Procedure: STENT REMOVAL;  Surgeon: Lemar Lofty., MD;  Location: [facility] ENDOSCOPY;  Service: Gastroenterology;;   UPPER GI ENDOSCOPY     x 1   VAGINAL PROLAPSE REPAIR  11/18/2019   [facility]    SOCIAL HISTORY: Social History   Socioeconomic History   Marital status: Widowed    Spouse name: Not on file   Number of children: 3   Years of  education: Not on file   Highest education level: Not on file  Occupational History   Occupation: retired  Tobacco Use   Smoking status: Former    Current packs/day: 0.00    Average packs/day: 1 pack/day for 8.0 years (8.0 ttl pk-yrs)    Types: Cigarettes    Start date: 06/18/1956    Quit date: 06/18/1964    Years since quitting: 58.4   Smokeless tobacco: Never  Vaping Use   Vaping status: Never Used  Substance and Sexual Activity   Alcohol use: No   Drug use: No   Sexual activity: Not Currently    Birth control/protection: Post-menopausal  Other Topics Concern   Not on file  Social History Narrative   Married.  Three children   Social Determinants of Health   Financial Resource Strain: Low Risk  (05/23/2022)   Overall Financial Resource Strain (CARDIA)    Difficulty of Paying Living Expenses: Not hard at all  Food Insecurity: No Food Insecurity (05/23/2022)   Hunger Vital Sign    Worried About Running Out of Food in the Last Year: Never true    Ran Out of Food in the Last Year: Never true  Transportation Needs: No Transportation Needs (05/23/2022)   PRAPARE - Administrator, Civil Service (Medical): No    Lack of Transportation (Non-Medical): No  Physical Activity: Not on file  Stress: Not on file  Social Connections: Not on file  Intimate Partner Violence: Not At Risk (05/23/2022)   Humiliation, Afraid, Rape, and Kick questionnaire    Fear of Current or Ex-Partner: No    Emotionally Abused: No    Physically Abused: No    Sexually Abused: No    FAMILY HISTORY: Family History  Problem Relation Age of Onset   Heart attack Father 71   Heart disease Brother 55       CABG   Colon cancer Neg Hx    Esophageal cancer Neg Hx    Inflammatory bowel disease Neg Hx    Liver disease Neg Hx    Pancreatic cancer Neg Hx    Rectal cancer Neg Hx    Stomach cancer Neg Hx      ROS  10 Point review of Systems was done is negative except as noted above.   PHYSICAL  EXAMINATION Vital signs reviewed There were no vitals filed for this visit.  Day 1, Cycle 20 11/06/22  Height 4\' 10"  (1.473 m)  Weight 119 lb (54 kg)  BSA (Calculated - sq m) 1.49 sq meters  Temp 98.1 F (36.7 C)  Temp src Oral  Pulse 75  Resp 18  BP 148/91 (H)  (H): Data is abnormally high GENERAL:alert, in no acute distress and comfortable SKIN: no acute rashes, no significant lesions EYES: conjunctiva are pink and non-injected, sclera anicteric LYMPH:  no palpable lymphadenopathy in the cervical or supraclavicular regions LUNGS: clear to auscultation b/l with normal respiratory effort HEART: regular rate &  rhythm PSYCH: alert & oriented x 3 with fluent speech NEURO: no focal motor/sensory deficits  EXTREMITIES: Erythema of distal portion of right foot that is warm to touch and edematous.       LABORATORY DATA: .    Latest Ref Rng & Units 11/06/2022    2:05 PM 10/22/2022    7:57 AM 10/21/2022    9:16 AM  CBC  WBC 4.0 - 10.5 K/uL 9.9  10.4    Hemoglobin 12.0 - 15.0 g/dL 82.9  56.2  13.0   Hematocrit 36.0 - 46.0 % 34.5  31.9  45.0   Platelets 150 - 400 K/uL 315  209     .    Latest Ref Rng & Units 11/06/2022    2:05 PM 10/21/2022    9:16 AM 10/09/2022    7:45 AM  CMP  Glucose 70 - 99 mg/dL 865  784  696   BUN 8 - 23 mg/dL 35  39  49   Creatinine 0.44 - 1.00 mg/dL 2.95  2.84  1.32   Sodium 135 - 145 mmol/L 137  141  136   Potassium 3.5 - 5.1 mmol/L 4.3  4.6  4.6   Chloride 98 - 111 mmol/L 107  111  111   CO2 22 - 32 mmol/L 20   18   Calcium 8.9 - 10.3 mg/dL 9.7   8.6   Total Protein 6.5 - 8.1 g/dL 6.3   5.9   Total Bilirubin 0.3 - 1.2 mg/dL 0.6   0.4   Alkaline Phos 38 - 126 U/L 60   81   AST 15 - 41 U/L 14   46   ALT 0 - 44 U/L 12   37    RADIOLOGY    CLINICAL DATA:  Subsequent treatment strategy for metastatic renal cell carcinoma.   EXAM: NUCLEAR MEDICINE PET SKULL BASE TO THIGH   TECHNIQUE: 6.32 mCi F-18 FDG was injected intravenously. Full-ring PET  imaging was performed from the skull base to thigh after the radiotracer. CT data was obtained and used for attenuation correction and anatomic localization.   Fasting blood glucose: 159 mg/dl   COMPARISON:  PET-CT 44/04/270 and 02/09/2020.   FINDINGS: Mediastinal blood pool activity: SUV max 2.7   NECK:   No hypermetabolic cervical lymph nodes are identified.There are no lesions of the pharyngeal mucosal space. Activity associated with the muscles of phonation is within physiologic limits and similar to previous study.   Incidental CT findings: Bilateral carotid atherosclerosis.   CHEST:   There are no hypermetabolic mediastinal, hilar or axillary lymph nodes. No hypermetabolic pulmonary activity or suspicious nodularity.   Incidental CT findings: Right IJ Port-A-Cath extends to the superior cavoatrial junction. There is diffuse atherosclerosis of the aorta, great vessels and coronary arteries. Mild linear scarring at both lung bases appears unchanged.   ABDOMEN/PELVIS:   There is no hypermetabolic activity within the liver, adrenal glands, spleen or pancreas. There is no hypermetabolic nodal activity. Stable appearance of the right kidney. No hypermetabolic activity within the left nephrectomy bed   Incidental CT findings: Diffuse aortic and branch vessel atherosclerosis, scattered colonic diverticulosis and pelvic floor laxity are again noted.   SKELETON:   There is no hypermetabolic activity to suggest osseous metastatic disease.   Incidental CT findings: Grossly stable sclerotic lesions within the thoracic spine, without hypermetabolic activity. Lower lumbar spondylosis and sacroiliac degenerative changes bilaterally.   IMPRESSION: 1. Stable PET-CT status post left nephrectomy. No evidence of local recurrence  or hypermetabolic metastatic disease. 2. Stable incidental findings including coronary and Aortic Atherosclerosis (ICD10-I70.0). Pelvic floor  laxity.     Electronically Signed   By: Carey Bullocks M.D.   On: 04/03/2021 12:30      ASSESSMENT and PLAN:   Audrey [name] is a 82 y.o. female who returns for a follow up for metastatic renal cell carcinoma.   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 -Rt adrenal gland bx - showed clear cell RCC -S/p Cytoreductive left radical nephrectomy and left adrenal gland resection on 08/01/2016 by Dr Laverle Patter. -Started systemic therapy with Nivolumab 240 mg on 01/02/2018 and Xgeva on 02/27/2018.  -Most recent PET scan from 07/03/2022 revealed no progression or metastatic disease.   # Hypothyroidism/Adrenal insufficiency/Diabetes -Continue being followed by Dr. Sharl Ma   #H/o Choledocholithiasis and cholelithiasis -07/10/18 US Abdomen revealed Biliary duct dilatation with 8 mm calculus at the level of the ampulla in the distal common bile duct. 2. Cholelithiasis. No gallbladder wall thickening or pericholecystic fluid. 3. Appearance of the liver raises concern for underlying hepatic cirrhosis. No focal liver lesions are demonstrable. 4.  Left kidney absent. 5.  Small cysts in right kidney. 6.  Aortic Atherosclerosis. -S/p ERCP on 07/23/18 with Dr. Vicente Serene Mansouraty -09/02/18 Cytopathology from ERCP was suspicious for malignant cells in bile duct, but was not definitive, other two findings were benign.  #Right foot wound 2/2 PAD and DM: --Under the care of  wound care from [facility] in [place]. --Recently admitted from 10/21/2022-10/22/2022 due to chronic right lower extremity limb threatening ischemia with wounds. Patient underwent balloon angioplasty with stent of right popliteal and SFA.  --Due to erythema of the right foot that patient is unsure is new, I will request a follow up with Dr. Randie Heinz, vascular surgery.   PLAN:   -Due for next dose of Nivolumab treatment. -Reviewed labs from today that require no  intervention. WBC 9.9, Hgb 11.2, Plt 315K. Stable creatinine at 1.71. LFTs normal.  -Proceed with Nivolumab and Xgeva treatment today without dose modifications -RTC in 2 weeks for labs and next treatment of Nivolumab.and 4 weeks for labs/follow up visit.   FOLLOW-UP: Per integrated scheduling   All of the patient's questions were answered with apparent satisfaction. The patient knows to call the clinic with any problems, questions or concerns.  I have spent a total of 30 minutes minutes of face-to-face and non-face-to-face time, preparing to see the patient,performing a medically appropriate examination, counseling and educating the patient,referring and communicating with other health care professionals, documenting clinical information in the electronic health record, and care coordination.   Georga Kaufmann PA-C Dept of Hematology and Oncology [facility] Phone: [phone]

## 2022-11-08 LAB — T4: T4, Total: 8.7 ug/dL (ref 4.5–12.0)

## 2022-11-11 ENCOUNTER — Other Ambulatory Visit: Payer: Self-pay

## 2022-11-11 ENCOUNTER — Other Ambulatory Visit: Payer: Self-pay | Admitting: *Deleted

## 2022-11-11 DIAGNOSIS — I70221 Atherosclerosis of native arteries of extremities with rest pain, right leg: Secondary | ICD-10-CM

## 2022-11-11 DIAGNOSIS — I739 Peripheral vascular disease, unspecified: Secondary | ICD-10-CM

## 2022-11-19 ENCOUNTER — Other Ambulatory Visit: Payer: Self-pay | Admitting: Hematology and Oncology

## 2022-11-19 ENCOUNTER — Ambulatory Visit: Payer: Medicare Other | Admitting: Hematology and Oncology

## 2022-11-19 ENCOUNTER — Ambulatory Visit: Payer: Medicare Other

## 2022-11-19 ENCOUNTER — Other Ambulatory Visit: Payer: Medicare Other

## 2022-11-19 NOTE — Progress Notes (Signed)
Office Note   History of Present Illness   Audrey [name] is a 82 y.o. (1940-12-24) female who presents for surveillance of PAD. They have a history of ***  The patient returns today for follow up. He/she denies any recent medical history changes. The patient also denies any claudication, rest pain, or tissue loss of the lower extremities.  Current Outpatient Medications  Medication Sig Dispense Refill   amLODipine (NORVASC) 5 MG tablet Take 5 mg by mouth daily.   6   atorvastatin (LIPITOR) 80 MG tablet Take 80 mg by mouth at bedtime.      carvedilol (COREG) 6.25 MG tablet Take 6.25 mg by mouth 2 (two) times daily.  6   clobetasol ointment (TEMOVATE) 0.05 % Apply 1 application topically 2 (two) times daily as needed (lichen sclerosis).      clopidogrel (PLAVIX) 75 MG tablet Take 75 mg by mouth daily.     dapagliflozin propanediol (FARXIGA) 10 MG TABS tablet Take 10 mg by mouth daily.     denosumab (XGEVA) 120 MG/1.7ML SOLN injection Inject 120 mg into the skin every 6 (six) weeks.     diphenhydrAMINE (BENADRYL) 25 MG tablet Take 25 mg by mouth at bedtime.     glimepiride (AMARYL) 1 MG tablet Take 1 mg by mouth daily with breakfast.     hydrocortisone (CORTEF) 10 MG tablet Take 5-10 mg by mouth See admin instructions. Take 1 tablet (10 mg) by mouth in the morning & 1/2 tablet (5 mg) by mouth in the evening.     ipratropium (ATROVENT HFA) 17 MCG/ACT inhaler Inhale 2 puffs into the lungs every 6 (six) hours as needed for wheezing.     levothyroxine (SYNTHROID) 75 MCG tablet Take 75 mcg by mouth daily before breakfast.      lidocaine (LIDODERM) 5 % Place 1 patch onto the skin daily as needed. Remove & Discard patch within 12 hours or as directed by MD (Patient taking differently: Place 1 patch onto the skin daily. Remove & Discard patch within 12 hours or as directed by MD) 30 patch 1   lidocaine (LMX) 4 % cream Apply 1 Application topically daily as needed (pain).     lidocaine-prilocaine  (EMLA) cream Apply 1 Application topically every 14 (fourteen) days.     Nivolumab (OPDIVO IV) Inject 1 Dose into the vein every 14 (fourteen) days. [facility]     omeprazole (PRILOSEC) 20 MG capsule Take 20 mg by mouth daily.     Prednisolon-Moxiflox-Bromfenac 1-0.5-0.075 % SOLN Place 1 drop into both eyes See admin instructions. 1 drop 1 time daily in the right eye, 1 drop 3 times in the left eye     repaglinide (PRANDIN) 0.5 MG tablet Take 0.5 mg by mouth 2 (two) times daily.     SSD 1 % cream Apply 1 Application topically daily as needed (Foot infection).     traMADol (ULTRAM) 50 MG tablet Take 1 tablet (50 mg total) by mouth every 6 (six) hours as needed. (Patient not taking: Reported on 10/16/2022) 30 tablet 0   No current facility-administered medications for this visit.    ***REVIEW OF SYSTEMS (negative unless checked):   Cardiac:  []  Chest pain or chest pressure? []  Shortness of breath upon activity? []  Shortness of breath when lying flat? []  Irregular heart rhythm?  Vascular:  []  Pain in calf, thigh, or hip brought on by walking? []  Pain in feet at night that wakes you up from your sleep? []   Blood clot in your veins? []  Leg swelling?  Pulmonary:  []  Oxygen at home? []  Productive cough? []  Wheezing?  Neurologic:  []  Sudden weakness in arms or legs? []  Sudden numbness in arms or legs? []  Sudden onset of difficult speaking or slurred speech? []  Temporary loss of vision in one eye? []  Problems with dizziness?  Gastrointestinal:  []  Blood in stool? []  Vomited blood?  Genitourinary:  []  Burning when urinating? []  Blood in urine?  Psychiatric:  []  Major depression  Hematologic:  []  Bleeding problems? []  Problems with blood clotting?  Dermatologic:  []  Rashes or ulcers?  Constitutional:  []  Fever or chills?  Ear/Nose/Throat:  []  Change in hearing? []  Nose bleeds? []  Sore throat?  Musculoskeletal:  []  Back pain? []  Joint pain? []  Muscle  pain?   Physical Examination  ***There were no vitals filed for this visit. ***There is no height or weight on file to calculate BMI.  General:  WDWN in NAD; vital signs documented above Gait: Not observed HENT: WNL, normocephalic Pulmonary: normal non-labored breathing , without rales, rhonchi,  wheezing Cardiac: {Desc; regular/irreg:14544} HR, without murmurs {With/Without:20273} carotid bruit*** Abdomen: soft, NT, no masses Skin: {With/Without:20273} rashes Vascular Exam/Pulses: Palpable/nonpalpable femoral pulses, palpable/nonpalpable popliteal pulses, palpable/nonpalpable pedal pulses. Left DP/PT/Peroneal doppler signals. Right DP/PT/Peroneal doppler signals Extremities: {With/Without:20273} ischemic changes, {With/Without:20273} gangrene , {With/Without:20273} cellulitis; {With/Without:20273} open wounds;  Musculoskeletal: no muscle wasting or atrophy  Neurologic: A&O X 3;  No focal weakness or paresthesias are detected Psychiatric:  The pt has {Desc; normal/abnormal:11317::"Normal"} affect.  Non-Invasive Vascular imaging   ABI (***) R:  ABI: *** (***),  PT: {Signals:19197::"none","mono","bi","tri"} DP: {Signals:19197::"none","mono","bi","tri"} TBI:  *** L:  ABI: *** (***),  PT: {Signals:19197::"none","mono","bi","tri"} DP: {Signals:19197::"none","mono","bi","tri"} TBI: ***  Aortoiliac Duplex (***)   *** Arterial Duplex (***)   Medical Decision Making   Audrey [name] is a 82 y.o. female who presents for surveillance of PAD  Based on the patient's vascular studies, their ABIs are essentially unchanged since last visit. *** Arterial duplex *** The patient denies any claudication, rest pain, or tissue loss. They have palpable/nonpalpable pedal pulses with *** doppler signals They will continue their *** and follow up with our office in *** months/year with ABIs and ***   Audrey Dubonnet PA-C [facility] Office: [phone]  Clinic  MD: ***

## 2022-11-20 ENCOUNTER — Inpatient Hospital Stay: Payer: Medicare Other

## 2022-11-20 ENCOUNTER — Ambulatory Visit: Payer: Medicare Other | Admitting: Physician Assistant

## 2022-11-20 ENCOUNTER — Ambulatory Visit: Payer: Medicare Other | Admitting: Hematology and Oncology

## 2022-11-20 ENCOUNTER — Ambulatory Visit (HOSPITAL_COMMUNITY)
Admission: RE | Admit: 2022-11-20 | Discharge: 2022-11-20 | Disposition: A | Discharge status: Home / Self Care | Payer: Medicare Other | Source: Ambulatory Visit | Attending: Vascular Surgery | Admitting: Vascular Surgery

## 2022-11-20 ENCOUNTER — Other Ambulatory Visit: Payer: Self-pay

## 2022-11-20 ENCOUNTER — Ambulatory Visit (INDEPENDENT_AMBULATORY_CARE_PROVIDER_SITE_OTHER)
Admission: RE | Admit: 2022-11-20 | Discharge: 2022-11-20 | Disposition: A | Discharge status: Home / Self Care | Payer: Medicare Other | Source: Ambulatory Visit | Attending: Vascular Surgery | Admitting: Vascular Surgery

## 2022-11-20 VITALS — BP 131/66 | HR 58 | Temp 98.1°F | Resp 18 | Ht <= 58 in | Wt 122.5 lb

## 2022-11-20 VITALS — BP 149/60 | HR 62 | Temp 97.7°F | Resp 13 | Wt 123.7 lb

## 2022-11-20 DIAGNOSIS — C642 Malignant neoplasm of left kidney, except renal pelvis: Secondary | ICD-10-CM

## 2022-11-20 DIAGNOSIS — I70221 Atherosclerosis of native arteries of extremities with rest pain, right leg: Secondary | ICD-10-CM

## 2022-11-20 DIAGNOSIS — Z5112 Encounter for antineoplastic immunotherapy: Secondary | ICD-10-CM | POA: Diagnosis not present

## 2022-11-20 DIAGNOSIS — I739 Peripheral vascular disease, unspecified: Secondary | ICD-10-CM

## 2022-11-20 DIAGNOSIS — C7951 Secondary malignant neoplasm of bone: Secondary | ICD-10-CM

## 2022-11-20 DIAGNOSIS — R300 Dysuria: Secondary | ICD-10-CM

## 2022-11-20 DIAGNOSIS — Z95828 Presence of other vascular implants and grafts: Secondary | ICD-10-CM

## 2022-11-20 LAB — CBC WITH DIFFERENTIAL (CANCER CENTER ONLY)
Abs Immature Granulocytes: 0.22 10*3/uL — ABNORMAL HIGH (ref 0.00–0.07)
Basophils Absolute: 0.1 10*3/uL (ref 0.0–0.1)
Basophils Relative: 1 %
Eosinophils Absolute: 0.1 10*3/uL (ref 0.0–0.5)
Eosinophils Relative: 1 %
HCT: 38.5 % (ref 36.0–46.0)
Hemoglobin: 12.4 g/dL (ref 12.0–15.0)
Immature Granulocytes: 2 %
Lymphocytes Relative: 12 %
Lymphs Abs: 1.2 10*3/uL (ref 0.7–4.0)
MCH: 30.2 pg (ref 26.0–34.0)
MCHC: 32.2 g/dL (ref 30.0–36.0)
MCV: 93.7 fL (ref 80.0–100.0)
Monocytes Absolute: 0.9 10*3/uL (ref 0.1–1.0)
Monocytes Relative: 10 %
Neutro Abs: 6.8 10*3/uL (ref 1.7–7.7)
Neutrophils Relative %: 74 %
Platelet Count: 272 10*3/uL (ref 150–400)
RBC: 4.11 MIL/uL (ref 3.87–5.11)
RDW: 16.5 % — ABNORMAL HIGH (ref 11.5–15.5)
WBC Count: 9.3 10*3/uL (ref 4.0–10.5)
nRBC: 0 % (ref 0.0–0.2)

## 2022-11-20 LAB — URINALYSIS, COMPLETE (UACMP) WITH MICROSCOPIC
Bilirubin Urine: NEGATIVE
Glucose, UA: >=500 mg/dL — AB
Ketones, ur: NEGATIVE mg/dL
Nitrite: NEGATIVE
Protein, ur: NEGATIVE mg/dL
Specific Gravity, Urine: 1.011 (ref 1.005–1.030)
WBC, UA: >50 WBC/hpf (ref 0–5)
pH: 5 (ref 5.0–8.0)

## 2022-11-20 LAB — CMP (CANCER CENTER ONLY)
ALT: 17 U/L (ref 0–44)
AST: 14 U/L — ABNORMAL LOW (ref 15–41)
Albumin: 3.8 g/dL (ref 3.5–5.0)
Alkaline Phosphatase: 56 U/L (ref 38–126)
Anion gap: 7 (ref 5–15)
BUN: 38 mg/dL — ABNORMAL HIGH (ref 8–23)
CO2: 19 mmol/L — ABNORMAL LOW (ref 22–32)
Calcium: 9.7 mg/dL (ref 8.9–10.3)
Chloride: 111 mmol/L (ref 98–111)
Creatinine: 1.6 mg/dL — ABNORMAL HIGH (ref 0.44–1.00)
GFR, Estimated: 32 mL/min — ABNORMAL LOW (ref >60)
Glucose, Bld: 218 mg/dL — ABNORMAL HIGH (ref 70–99)
Potassium: 4.4 mmol/L (ref 3.5–5.1)
Sodium: 137 mmol/L (ref 135–145)
Total Bilirubin: 0.5 mg/dL (ref 0.3–1.2)
Total Protein: 6.4 g/dL — ABNORMAL LOW (ref 6.5–8.1)

## 2022-11-20 LAB — VAS US ABI WITH/WO TBI
Left ABI: 0.51
Right ABI: 0.92

## 2022-11-20 LAB — TSH: TSH: 0.631 u[IU]/mL (ref 0.350–4.500)

## 2022-11-20 MED ORDER — HEPARIN SOD (PORK) LOCK FLUSH 100 UNIT/ML IV SOLN
500.0000 [IU] | Freq: Once | INTRAVENOUS | Status: AC | PRN
  Administered 2022-11-20: 500 [IU]

## 2022-11-20 MED ORDER — SODIUM CHLORIDE 0.9% FLUSH
10.0000 mL | Freq: Once | INTRAVENOUS | Status: AC
  Administered 2022-11-20: 10 mL

## 2022-11-20 MED ORDER — SODIUM CHLORIDE 0.9 % IV SOLN: Freq: Once | INTRAVENOUS | Status: AC

## 2022-11-20 MED ORDER — SODIUM CHLORIDE 0.9% FLUSH
10.0000 mL | INTRAVENOUS | Status: DC | PRN
  Administered 2022-11-20: 10 mL

## 2022-11-20 MED ORDER — SODIUM CHLORIDE 0.9 % IV SOLN
240.0000 mg | Freq: Once | INTRAVENOUS | Status: AC
  Administered 2022-11-20: 240 mg via INTRAVENOUS
  Filled 2022-11-20: qty 24

## 2022-11-20 NOTE — Progress Notes (Signed)
[facility] Cancer Follow up:   DOS: 11/06/22  PCP Audrey Parody, MD [address] [place] [address]  DIAGNOSIS: metastatic renal cell carcinoma   SUMMARY OF ONCOLOGIC HISTORY: Oncology History  Cancer, 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HCC)  01/10/2017 Initial Diagnosis   Cancer, 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HCC)   01/02/2018 - 01/16/2022 Chemotherapy   Patient is on Treatment Plan : RENAL CELL CARCINOMA NIVOLUMAB Q14D     01/22/2022 Cancer Staging   Staging form: Bone - Appendicular Skeleton, Trunk, Skull, and Facial Bones, AJCC 8th Edition - Clinical: Stage IVB (pM1b) - Signed by Audrey Maine, MD on 01/22/2022   02/01/2022 -  Chemotherapy   Patient is on Treatment Plan : RENAL CELL CARCINOMA Nivolumab (240) q14d/     Renal cell carcinoma, left (HCC)  04/11/2017 Initial Diagnosis   Renal cell carcinoma (HCC)   01/02/2018 - 01/16/2022 Chemotherapy   The patient had nivolumab (OPDIVO) 240 mg in sodium chloride 0.9 % 100 mL chemo infusion, 240 mg, Intravenous, Once, 47 of 50 cycles Administration: 240 mg (01/02/2018), 240 mg (01/15/2018), 240 mg (01/30/2018), 240 mg (02/13/2018), 240 mg (02/27/2018), 240 mg (03/13/2018), 240 mg (03/27/2018), 240 mg (04/10/2018), 240 mg (04/24/2018), 240 mg (05/08/2018), 240 mg (05/22/2018), 240 mg (06/05/2018), 240 mg (06/19/2018), 240 mg (08/28/2018), 240 mg (09/11/2018), 240 mg (10/09/2018), 240 mg (10/22/2018), 240 mg (11/06/2018), 240 mg (11/20/2018), 240 mg (12/04/2018), 240 mg (12/18/2018), 240 mg (01/01/2019), 240 mg (01/15/2019), 240 mg (01/29/2019), 240 mg (02/12/2019), 240 mg (02/26/2019), 240 mg (03/12/2019), 240 mg (03/26/2019), 240 mg (04/09/2019), 240 mg (04/28/2019), 240 mg (05/12/2019), 240 mg (05/27/2019), 240 mg (06/09/2019), 240 mg (06/23/2019), 240 mg (07/07/2019), 240 mg (07/21/2019), 240 mg (08/04/2019), 240 mg (08/18/2019), 240 mg (09/03/2019), 240 mg (09/15/2019), 240 mg (09/29/2019), 240 mg (10/12/2019), 240 mg (10/27/2019), 240 mg (11/10/2019), 240 mg  (12/08/2019), 240 mg (12/22/2019), 240 mg (01/19/2020)  for chemotherapy treatment.    02/01/2022 -  Chemotherapy   Patient is on Treatment Plan : RENAL CELL CARCINOMA Nivolumab (240) q14d/       CURRENT THERAPY: Nivolumab q 14 days, Xgeva q 6 weeks  INTERVAL HISTORY:  Audrey [name] 82 y.o. female is here for continued evaluation and management of metastatic renal cell carcinoma. She is here to continue her Nivolumab fherapy.Patient was last seen by Dr. Candise [name] on 11/06/2022. She presents to the clinic unaccompanied.   [name] reports she has been well overall in the interim since her last visit.  She reports that she does have some burning after urination currently, but no urgency, smell, or change in color.  She reports that her treatments of nivolumab are going well.  She is tolerating it with only some mild fatigue.  She has not developed any rash, fevers, chills, sweats, nausea, vomiting or diarrhea.  She denies any runny nose, sore throat, or cough.  She reports that she does still have some swelling of her right lower extremity after the vascular procedure but notes the blood flow is markedly improved.  Her appetite has been good.  She does feel somewhat wiped out and attributes this to the fact that she is recently widowed, her husband passed away 4 months ago.  Overall she is willing and able to proceed with nivolumab treatment at this time.  She has no other westerns, concerns, or complaints.  A full 10 point ROS was otherwise negative.  Patient Active Problem List   Diagnosis Date Noted   PAD (peripheral artery disease) (  HCC) 10/21/2022   Atrophy of thyroid (acquired) 01/15/2022   At risk for falls 01/15/2022   CKD (chronic kidney disease) 01/15/2022   Diabetic ulcer of foot with fat layer exposed (HCC) 01/15/2022   Dizziness 01/15/2022   Dysuria 01/15/2022   Knee osteoarthritis 01/15/2022   Pulmonary nodule 01/15/2022   Type 2 diabetes mellitus (HCC) 08/15/2021   Incisional hernia  06/23/2020   Gastritis and gastroduodenitis 02/26/2020   Rectal discomfort 02/26/2020   History of colonic polyps 02/26/2020   Ventral hernia 11/10/2019   History of cholecystectomy 07/17/2019   Degeneration of lumbar intervertebral disc 11/12/2018   Anemia 10/10/2018   Chronic cholecystitis 09/04/2018   Acute cholecystitis 08/29/2018   Biliary stricture 08/23/2018   Medicare annual wellness visit, subsequent 08/14/2018   CAD (coronary artery disease) 08/14/2018   Dyslipidemia 08/14/2018   Abnormal LFTs 08/05/2018   Choledocholithiasis 08/05/2018   Dilated bile duct 08/05/2018   History of ERCP 08/05/2018   History of renal cell carcinoma 08/05/2018   Port-A-Cath in place 01/30/2018   Counseling regarding advance care planning and goals of care 01/04/2018   Renal cell carcinoma, left (HCC) 04/11/2017   Cancer, 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HCC) 01/10/2017   Left renal mass 08/01/2016   Hyperlipidemia 05/23/2015   Essential hypertension 05/23/2015   Hypothyroidism 05/23/2015    is allergic to doxycycline, adhesive [tape], duloxetine, gabapentin (once-daily), nsaids, and statins.  MEDICAL HISTORY: Past Medical History:  Diagnosis Date   Anemia    Arthritis    lower back, hips, hands   Biliary stricture    Diabetes mellitus (HCC)    type 2    Early cataracts, bilateral    Md just watching   Elevated liver enzymes    Family history of adverse reaction to anesthesia    Daughter hard to wake up   Gallstones    GERD (gastroesophageal reflux disease)    occasional - diet controlled   History of blood transfusion 2018   History of hiatal hernia    HTN (hypertension)    Hyperlipidemia    Hypothyroidism    left renal ca dx'd 2018   renal cancer - left kidney removed, pill chemo x 1 yr   Myocardial infarction (HCC) 1991   no deficits   SVD (spontaneous vaginal delivery)    x 3   Wears glasses     SURGICAL HISTORY: Past Surgical History:  Procedure Laterality Date    ABDOMINAL AORTOGRAM W/LOWER EXTREMITY N/A 10/21/2022   Procedure: ABDOMINAL AORTOGRAM W/LOWER EXTREMITY;  Surgeon: Maeola Harman, MD;  Location: [facility] INVASIVE CV LAB;  Service: Cardiovascular;  Laterality: N/A;   BALLOON DILATION N/A 07/23/2018   Procedure: BALLOON DILATION;  Surgeon: Meridee Score Netty Starring., MD;  Location: [facility] ENDOSCOPY;  Service: Gastroenterology;  Laterality: N/A;   BILIARY BRUSHING  08/10/2018   Procedure: BILIARY BRUSHING;  Surgeon: Meridee Score Netty Starring., MD;  Location: [facility] ENDOSCOPY;  Service: Gastroenterology;;   BILIARY BRUSHING  11/30/2018   Procedure: BILIARY BRUSHING;  Surgeon: Lemar Lofty., MD;  Location: [facility] ENDOSCOPY;  Service: Gastroenterology;;   BILIARY DILATION  08/10/2018   Procedure: BILIARY DILATION;  Surgeon: Lemar Lofty., MD;  Location: [facility] ENDOSCOPY;  Service: Gastroenterology;;   BILIARY DILATION  08/27/2018   Procedure: BILIARY DILATION;  Surgeon: Lemar Lofty., MD;  Location: [facility] ENDOSCOPY;  Service: Gastroenterology;;   BILIARY DILATION  01/24/2020   Procedure: BILIARY DILATION;  Surgeon: Lemar Lofty., MD;  Location: [facility] ENDOSCOPY;  Service: Gastroenterology;;   BILIARY STENT PLACEMENT  08/10/2018   Procedure: BILIARY STENT PLACEMENT;  Surgeon: Meridee Score Netty Starring., MD;  Location: [facility] ENDOSCOPY;  Service: Gastroenterology;;   BILIARY STENT PLACEMENT  08/27/2018   Procedure: BILIARY STENT PLACEMENT;  Surgeon: Lemar Lofty., MD;  Location: [facility] ENDOSCOPY;  Service: Gastroenterology;;   BILIARY STENT PLACEMENT  11/30/2018   Procedure: BILIARY STENT PLACEMENT;  Surgeon: Lemar Lofty., MD;  Location: [facility] ENDOSCOPY;  Service: Gastroenterology;;   BIOPSY  07/23/2018   Procedure: BIOPSY;  Surgeon: Lemar Lofty., MD;  Location: [facility] ENDOSCOPY;  Service: Gastroenterology;;   BIOPSY  01/24/2020   Procedure: BIOPSY;  Surgeon: Lemar Lofty., MD;  Location: [facility] ENDOSCOPY;  Service:  Gastroenterology;;   CHOLECYSTECTOMY N/A 09/02/2018   Procedure: LAPAROSCOPIC CHOLECYSTECTOMY;  Surgeon: Almond Lint, MD;  Location: [facility] OR;  Service: General;  Laterality: N/A;   COLONOSCOPY     normal    ENDOSCOPIC RETROGRADE CHOLANGIOPANCREATOGRAPHY (ERCP) WITH PROPOFOL N/A 08/10/2018   Procedure: ENDOSCOPIC RETROGRADE CHOLANGIOPANCREATOGRAPHY (ERCP) WITH PROPOFOL;  Surgeon: Lemar Lofty., MD;  Location: [facility] ENDOSCOPY;  Service: Gastroenterology;  Laterality: N/A;   ENDOSCOPIC RETROGRADE CHOLANGIOPANCREATOGRAPHY (ERCP) WITH PROPOFOL N/A 11/30/2018   Procedure: ENDOSCOPIC RETROGRADE CHOLANGIOPANCREATOGRAPHY (ERCP) WITH PROPOFOL;  Surgeon: Meridee Score Netty Starring., MD;  Location: [facility] ENDOSCOPY;  Service: Gastroenterology;  Laterality: N/A;   ENDOSCOPIC RETROGRADE CHOLANGIOPANCREATOGRAPHY (ERCP) WITH PROPOFOL N/A 01/24/2020   Procedure: ENDOSCOPIC RETROGRADE CHOLANGIOPANCREATOGRAPHY (ERCP) WITH PROPOFOL;  Surgeon: Meridee Score Netty Starring., MD;  Location: [facility] ENDOSCOPY;  Service: Gastroenterology;  Laterality: N/A;   ERCP N/A 07/23/2018   Procedure: ENDOSCOPIC RETROGRADE CHOLANGIOPANCREATOGRAPHY (ERCP);  Surgeon: Lemar Lofty., MD;  Location: [facility] ENDOSCOPY;  Service: Gastroenterology;  Laterality: N/A;   ERCP N/A 08/27/2018   Procedure: ENDOSCOPIC RETROGRADE CHOLANGIOPANCREATOGRAPHY (ERCP);  Surgeon: Lemar Lofty., MD;  Location: [facility] ENDOSCOPY;  Service: Gastroenterology;  Laterality: N/A;   ESOPHAGOGASTRODUODENOSCOPY N/A 01/24/2020   Procedure: ESOPHAGOGASTRODUODENOSCOPY (EGD);  Surgeon: Lemar Lofty., MD;  Location: [facility] ENDOSCOPY;  Service: Gastroenterology;  Laterality: N/A;   ESOPHAGOGASTRODUODENOSCOPY (EGD) WITH PROPOFOL N/A 08/27/2018   Procedure: ESOPHAGOGASTRODUODENOSCOPY (EGD) WITH PROPOFOL;  Surgeon: Meridee Score Netty Starring., MD;  Location: [facility] ENDOSCOPY;  Service: Gastroenterology;  Laterality: N/A;   EUS N/A 08/27/2018   Procedure: ESOPHAGEAL ENDOSCOPIC ULTRASOUND  (EUS) RADIAL;  Surgeon: Meridee Score Netty Starring., MD;  Location: [facility] ENDOSCOPY;  Service: Gastroenterology;  Laterality: N/A;   FINE NEEDLE ASPIRATION  08/27/2018   Procedure: FINE NEEDLE ASPIRATION (FNA) LINEAR;  Surgeon: Lemar Lofty., MD;  Location: [facility] ENDOSCOPY;  Service: Gastroenterology;;   Sherald Hess HERNIA REPAIR N/A 06/23/2020   Procedure: OPEN INCISIONAL HERNIA REPAIR WITH MESH;  Surgeon: Quentin Ore, MD;  Location: [facility] ORS;  Service: General;  Laterality: N/A;  ROOM 2 STARTING AT 11:00AM FOR 90 MIN   IR IMAGING GUIDED PORT INSERTION  01/08/2018   LAPAROSCOPIC NEPHRECTOMY Left 08/01/2016   Procedure: LAPAROSCOPIC  RADICAL NEPHRECTOMY/ REPAIR OF UMBILICAL HERNIA;  Surgeon: Heloise Purpura, MD;  Location: [facility] ORS;  Service: Urology;  Laterality: Left;   PERIPHERAL VASCULAR INTERVENTION  10/21/2022   Procedure: PERIPHERAL VASCULAR INTERVENTION;  Surgeon: Maeola Harman, MD;  Location: [facility] INVASIVE CV LAB;  Service: Cardiovascular;;   REMOVAL OF STONES  07/23/2018   Procedure: REMOVAL OF GALL STONES;  Surgeon: Meridee Score Netty Starring., MD;  Location: [facility] ENDOSCOPY;  Service: Gastroenterology;;   REMOVAL OF STONES  08/10/2018   Procedure: REMOVAL OF STONES;  Surgeon: Lemar Lofty., MD;  Location: [facility] ENDOSCOPY;  Service: Gastroenterology;;   REMOVAL OF STONES  08/27/2018   Procedure:  REMOVAL OF STONES;  Surgeon: Mansouraty, Netty Starring., MD;  Location: [facility] ENDOSCOPY;  Service: Gastroenterology;;   REMOVAL OF STONES  01/24/2020   Procedure: REMOVAL OF STONES;  Surgeon: Lemar Lofty., MD;  Location: [facility] ENDOSCOPY;  Service: Gastroenterology;;   Dennison Mascot  07/23/2018   Procedure: Dennison Mascot;  Surgeon: Lemar Lofty., MD;  Location: [facility] ENDOSCOPY;  Service: Gastroenterology;;   Francine Graven REMOVAL  08/27/2018   Procedure: STENT REMOVAL;  Surgeon: Lemar Lofty., MD;  Location: [facility] ENDOSCOPY;  Service: Gastroenterology;;   Francine Graven REMOVAL  11/30/2018    Procedure: STENT REMOVAL;  Surgeon: Lemar Lofty., MD;  Location: [facility] ENDOSCOPY;  Service: Gastroenterology;;   Francine Graven REMOVAL  01/24/2020   Procedure: STENT REMOVAL;  Surgeon: Lemar Lofty., MD;  Location: [facility] ENDOSCOPY;  Service: Gastroenterology;;   UPPER GI ENDOSCOPY     x 1   VAGINAL PROLAPSE REPAIR  11/18/2019   [facility]    SOCIAL HISTORY: Social History   Socioeconomic History   Marital status: Widowed    Spouse name: Not on file   Number of children: 3   Years of education: Not on file   Highest education level: Not on file  Occupational History   Occupation: retired  Tobacco Use   Smoking status: Former    Current packs/day: 0.00    Average packs/day: 1 pack/day for 8.0 years (8.0 ttl pk-yrs)    Types: Cigarettes    Start date: 06/18/1956    Quit date: 06/18/1964    Years since quitting: 58.4   Smokeless tobacco: Never  Vaping Use   Vaping status: Never Used  Substance and Sexual Activity   Alcohol use: No   Drug use: No   Sexual activity: Not Currently    Birth control/protection: Post-menopausal  Other Topics Concern   Not on file  Social History Narrative   Married.  Three children   Social Determinants of Health   Financial Resource Strain: Low Risk  (05/23/2022)   Overall Financial Resource Strain (CARDIA)    Difficulty of Paying Living Expenses: Not hard at all  Food Insecurity: No Food Insecurity (05/23/2022)   Hunger Vital Sign    Worried About Running Out of Food in the Last Year: Never true    Ran Out of Food in the Last Year: Never true  Transportation Needs: No Transportation Needs (05/23/2022)   PRAPARE - Administrator, Civil Service (Medical): No    Lack of Transportation (Non-Medical): No  Physical Activity: Not on file  Stress: Not on file  Social Connections: Not on file  Intimate Partner Violence: Not At Risk (05/23/2022)   Humiliation, Afraid, Rape, and Kick questionnaire    Fear of Current or Ex-Partner: No     Emotionally Abused: No    Physically Abused: No    Sexually Abused: No    FAMILY HISTORY: Family History  Problem Relation Age of Onset   Heart attack Father 49   Heart disease Brother 93       CABG   Colon cancer Neg Hx    Esophageal cancer Neg Hx    Inflammatory bowel disease Neg Hx    Liver disease Neg Hx    Pancreatic cancer Neg Hx    Rectal cancer Neg Hx    Stomach cancer Neg Hx      ROS  10 Point review of Systems was done is negative except as noted above.   PHYSICAL EXAMINATION Vital signs reviewed Vitals:   11/20/22 1252  BP: (!) 149/60  Pulse: 62  Resp: 13  Temp: 97.7 F (36.5 C)  SpO2: 100%    Day 1, Cycle 20 11/06/22  Height 4\' 10"  (1.473 m)  Weight 119 lb (54 kg)  BSA (Calculated - sq m) 1.49 sq meters  Temp 98.1 F (36.7 C)  Temp src Oral  Pulse 75  Resp 18  BP 148/91 (H)  (H): Data is abnormally high  GENERAL: Well-appearing elderly Caucasian female, alert, in no acute distress and comfortable SKIN: no acute rashes, no significant lesions EYES: conjunctiva are pink and non-injected, sclera anicteric LYMPH:  no palpable lymphadenopathy in the cervical or supraclavicular regions LUNGS: clear to auscultation b/l with normal respiratory effort HEART: regular rate & rhythm PSYCH: alert & oriented x 3 with fluent speech NEURO: no focal motor/sensory deficits  EXTREMITIES: Swelling of the lower extremities, right greater than left.  No erythema.   LABORATORY DATA: .    Latest Ref Rng & Units 11/20/2022   11:57 AM 11/06/2022    2:05 PM 10/22/2022    7:57 AM  CBC  WBC 4.0 - 10.5 K/uL 9.3  9.9  10.4   Hemoglobin 12.0 - 15.0 g/dL 56.2  13.0  86.5   Hematocrit 36.0 - 46.0 % 38.5  34.5  31.9   Platelets 150 - 400 K/uL 272  315  209    .    Latest Ref Rng & Units 11/20/2022   11:57 AM 11/06/2022    2:05 PM 10/21/2022    9:16 AM  CMP  Glucose 70 - 99 mg/dL 784  696  295   BUN 8 - 23 mg/dL 38  35  39   Creatinine 0.44 - 1.00 mg/dL 2.84  1.32   4.40   Sodium 135 - 145 mmol/L 137  137  141   Potassium 3.5 - 5.1 mmol/L 4.4  4.3  4.6   Chloride 98 - 111 mmol/L 111  107  111   CO2 22 - 32 mmol/L 19  20    Calcium 8.9 - 10.3 mg/dL 9.7  9.7    Total Protein 6.5 - 8.1 g/dL 6.4  6.3    Total Bilirubin 0.3 - 1.2 mg/dL 0.5  0.6    Alkaline Phos 38 - 126 U/L 56  60    AST 15 - 41 U/L 14  14    ALT 0 - 44 U/L 17  12     RADIOLOGY    CLINICAL DATA:  Subsequent treatment strategy for metastatic renal cell carcinoma.   EXAM: NUCLEAR MEDICINE PET SKULL BASE TO THIGH   TECHNIQUE: 6.32 mCi F-18 FDG was injected intravenously. Full-ring PET imaging was performed from the skull base to thigh after the radiotracer. CT data was obtained and used for attenuation correction and anatomic localization.   Fasting blood glucose: 159 mg/dl   COMPARISON:  PET-CT 02/16/2535 and 02/09/2020.   FINDINGS: Mediastinal blood pool activity: SUV max 2.7   NECK:   No hypermetabolic cervical lymph nodes are identified.There are no lesions of the pharyngeal mucosal space. Activity associated with the muscles of phonation is within physiologic limits and similar to previous study.   Incidental CT findings: Bilateral carotid atherosclerosis.   CHEST:   There are no hypermetabolic mediastinal, hilar or axillary lymph nodes. No hypermetabolic pulmonary activity or suspicious nodularity.   Incidental CT findings: Right IJ Port-A-Cath extends to the superior cavoatrial junction. There is diffuse atherosclerosis of the aorta, great vessels and coronary arteries. Mild linear scarring  at both lung bases appears unchanged.   ABDOMEN/PELVIS:   There is no hypermetabolic activity within the liver, adrenal glands, spleen or pancreas. There is no hypermetabolic nodal activity. Stable appearance of the right kidney. No hypermetabolic activity within the left nephrectomy bed   Incidental CT findings: Diffuse aortic and branch vessel atherosclerosis,  scattered colonic diverticulosis and pelvic floor laxity are again noted.   SKELETON:   There is no hypermetabolic activity to suggest osseous metastatic disease.   Incidental CT findings: Grossly stable sclerotic lesions within the thoracic spine, without hypermetabolic activity. Lower lumbar spondylosis and sacroiliac degenerative changes bilaterally.   IMPRESSION: 1. Stable PET-CT status post left nephrectomy. No evidence of local recurrence or hypermetabolic metastatic disease. 2. Stable incidental findings including coronary and Aortic Atherosclerosis (ICD10-I70.0). Pelvic floor laxity.     Electronically Signed   By: Carey Bullocks M.D.   On: 04/03/2021 12:30      ASSESSMENT and PLAN:   MARVA KROMER is a 82 y.o. female who returns for a follow up for metastatic renal cell carcinoma.   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 -Rt adrenal gland bx - showed clear cell RCC -S/p Cytoreductive left radical nephrectomy and left adrenal gland resection on 08/01/2016 by Dr Laverle Patter. -Started systemic therapy with Nivolumab 240 mg on 01/02/2018 and Xgeva on 02/27/2018.  -Most recent PET scan from 07/03/2022 revealed no progression or metastatic disease.   # Hypothyroidism/Adrenal insufficiency/Diabetes -Continue being followed by Dr. Sharl Ma   #H/o Choledocholithiasis and cholelithiasis -07/10/18 US Abdomen revealed Biliary duct dilatation with 8 mm calculus at the level of the ampulla in the distal common bile duct. 2. Cholelithiasis. No gallbladder wall thickening or pericholecystic fluid. 3. Appearance of the liver raises concern for underlying hepatic cirrhosis. No focal liver lesions are demonstrable. 4.  Left kidney absent. 5.  Small cysts in right kidney. 6.  Aortic Atherosclerosis. -S/p ERCP on 07/23/18 with Dr. Vicente Serene Mansouraty -09/02/18 Cytopathology from ERCP was suspicious  for malignant cells in bile duct, but was not definitive, other two findings were benign.  #Right foot wound 2/2 PAD and DM: --Under the care of  wound care from [facility] in [place]. --Recently admitted from 10/21/2022-10/22/2022 due to chronic right lower extremity limb threatening ischemia with wounds. Patient underwent balloon angioplasty with stent of right popliteal and SFA.   PLAN:   -Due for next dose of Nivolumab treatment. -Reviewed labs from today that require no intervention. WBC 9.3, Hgb 12.4, MCV 93.7, Plt 272. Stable creatinine at 1.60. LFTs normal.  -Proceed with Nivolumab treatment today without dose modifications -RTC in 2 weeks for labs and next treatment of Nivolumab.and 4 weeks for labs/follow up visit.   FOLLOW-UP: Per integrated scheduling  All of the patient's questions were answered with apparent satisfaction. The patient knows to call the clinic with any problems, questions or concerns.  I have spent a total of 30 minutes minutes of face-to-face and non-face-to-face time, preparing to see the patient,performing a medically appropriate examination, counseling and educating the patient,referring and communicating with other health care professionals, documenting clinical information in the electronic health record, and care coordination.   Ulysees Barns, MD Department of Hematology/Oncology [facility] Phone: [phone] Pager: [phone] Email: [email or web address]

## 2022-11-20 NOTE — Patient Instructions (Signed)
[facility]  Discharge Instructions: Thank you for choosing [facility] to provide your oncology and hematology care.   If you have a lab appointment with the Cancer Center, please go directly to the Cancer Center and check in at the registration area.   Wear comfortable clothing and clothing appropriate for easy access to any Portacath or PICC line.   We strive to give you quality time with your provider. You may need to reschedule your appointment if you arrive late (15 or more minutes).  Arriving late affects you and other patients whose appointments are after yours.  Also, if you miss three or more appointments without notifying the office, you may be dismissed from the clinic at the provider's discretion.      For prescription refill requests, have your pharmacy contact our office and allow 72 hours for refills to be completed.    Today you received the following chemotherapy and/or immunotherapy agents: Nivolumab.       To help prevent nausea and vomiting after your treatment, we encourage you to take your nausea medication as directed.  BELOW ARE SYMPTOMS THAT SHOULD BE REPORTED IMMEDIATELY: *FEVER GREATER THAN 100.4 F (38 C) OR HIGHER *CHILLS OR SWEATING *NAUSEA AND VOMITING THAT IS NOT CONTROLLED WITH YOUR NAUSEA MEDICATION *UNUSUAL SHORTNESS OF BREATH *UNUSUAL BRUISING OR BLEEDING *URINARY PROBLEMS (pain or burning when urinating, or frequent urination) *BOWEL PROBLEMS (unusual diarrhea, constipation, pain near the anus) TENDERNESS IN MOUTH AND THROAT WITH OR WITHOUT PRESENCE OF ULCERS (sore throat, sores in mouth, or a toothache) UNUSUAL RASH, SWELLING OR PAIN  UNUSUAL VAGINAL DISCHARGE OR ITCHING   Items with * indicate a potential emergency and should be followed up as soon as possible or go to the Emergency Department if any problems should occur.  Please show the CHEMOTHERAPY ALERT CARD or IMMUNOTHERAPY ALERT CARD at  check-in to the Emergency Department and triage nurse.  Should you have questions after your visit or need to cancel or reschedule your appointment, please contact  [facility]  Dept: [phone]  and follow the prompts.  Office hours are 8:00 a.m. to 4:30 p.m. Monday - Friday. Please note that voicemails left after 4:00 p.m. may not be returned until the following business day.  We are closed weekends and major holidays. You have access to a nurse at all times for urgent questions. Please call the main number to the clinic Dept: [phone] and follow the prompts.   For any non-urgent questions, you may also contact your provider using MyChart. We now offer e-Visits for anyone 18 and older to request care online for non-urgent symptoms. For details visit [email or web address].   Also download the MyChart app! Go to the app store, search "MyChart", open the app, select , and log in with your MyChart username and password.   

## 2022-11-21 ENCOUNTER — Other Ambulatory Visit: Payer: Self-pay

## 2022-11-22 ENCOUNTER — Other Ambulatory Visit: Payer: Self-pay

## 2022-11-22 DIAGNOSIS — I739 Peripheral vascular disease, unspecified: Secondary | ICD-10-CM

## 2022-11-22 NOTE — Progress Notes (Signed)
Contacted Audrey [name] per Dr Leonides Schanz to let Audrey [name] know:  that her urine did not show any active UTI.  Audrey [name] acknowledged information and verbalized understanding.

## 2022-12-04 ENCOUNTER — Other Ambulatory Visit: Payer: Self-pay

## 2022-12-04 ENCOUNTER — Inpatient Hospital Stay (HOSPITAL_BASED_OUTPATIENT_CLINIC_OR_DEPARTMENT_OTHER): Payer: Medicare Other | Admitting: Physician Assistant

## 2022-12-04 ENCOUNTER — Encounter: Payer: Medicare Other | Admitting: Vascular Surgery

## 2022-12-04 ENCOUNTER — Encounter (HOSPITAL_COMMUNITY): Payer: Medicare Other

## 2022-12-04 ENCOUNTER — Inpatient Hospital Stay: Payer: Medicare Other | Attending: Hematology

## 2022-12-04 ENCOUNTER — Inpatient Hospital Stay: Payer: Medicare Other

## 2022-12-04 VITALS — BP 146/71 | HR 67 | Temp 97.9°F | Wt 123.5 lb

## 2022-12-04 DIAGNOSIS — E1151 Type 2 diabetes mellitus with diabetic peripheral angiopathy without gangrene: Secondary | ICD-10-CM | POA: Diagnosis not present

## 2022-12-04 DIAGNOSIS — Z87891 Personal history of nicotine dependence: Secondary | ICD-10-CM | POA: Insufficient documentation

## 2022-12-04 DIAGNOSIS — R300 Dysuria: Secondary | ICD-10-CM

## 2022-12-04 DIAGNOSIS — C642 Malignant neoplasm of left kidney, except renal pelvis: Secondary | ICD-10-CM

## 2022-12-04 DIAGNOSIS — E1122 Type 2 diabetes mellitus with diabetic chronic kidney disease: Secondary | ICD-10-CM | POA: Diagnosis not present

## 2022-12-04 DIAGNOSIS — C7951 Secondary malignant neoplasm of bone: Secondary | ICD-10-CM | POA: Insufficient documentation

## 2022-12-04 DIAGNOSIS — E785 Hyperlipidemia, unspecified: Secondary | ICD-10-CM | POA: Diagnosis not present

## 2022-12-04 DIAGNOSIS — M7989 Other specified soft tissue disorders: Secondary | ICD-10-CM | POA: Insufficient documentation

## 2022-12-04 DIAGNOSIS — Z86010 Personal history of colonic polyps: Secondary | ICD-10-CM | POA: Diagnosis not present

## 2022-12-04 DIAGNOSIS — Z5112 Encounter for antineoplastic immunotherapy: Secondary | ICD-10-CM | POA: Insufficient documentation

## 2022-12-04 DIAGNOSIS — K449 Diaphragmatic hernia without obstruction or gangrene: Secondary | ICD-10-CM | POA: Insufficient documentation

## 2022-12-04 DIAGNOSIS — E034 Atrophy of thyroid (acquired): Secondary | ICD-10-CM | POA: Insufficient documentation

## 2022-12-04 DIAGNOSIS — K219 Gastro-esophageal reflux disease without esophagitis: Secondary | ICD-10-CM | POA: Insufficient documentation

## 2022-12-04 DIAGNOSIS — L732 Hidradenitis suppurativa: Secondary | ICD-10-CM | POA: Insufficient documentation

## 2022-12-04 DIAGNOSIS — R350 Frequency of micturition: Secondary | ICD-10-CM | POA: Insufficient documentation

## 2022-12-04 DIAGNOSIS — E1136 Type 2 diabetes mellitus with diabetic cataract: Secondary | ICD-10-CM | POA: Insufficient documentation

## 2022-12-04 DIAGNOSIS — I2510 Atherosclerotic heart disease of native coronary artery without angina pectoris: Secondary | ICD-10-CM | POA: Diagnosis not present

## 2022-12-04 DIAGNOSIS — I252 Old myocardial infarction: Secondary | ICD-10-CM | POA: Diagnosis not present

## 2022-12-04 DIAGNOSIS — Z95828 Presence of other vascular implants and grafts: Secondary | ICD-10-CM

## 2022-12-04 DIAGNOSIS — I129 Hypertensive chronic kidney disease with stage 1 through stage 4 chronic kidney disease, or unspecified chronic kidney disease: Secondary | ICD-10-CM | POA: Diagnosis not present

## 2022-12-04 DIAGNOSIS — R911 Solitary pulmonary nodule: Secondary | ICD-10-CM | POA: Diagnosis not present

## 2022-12-04 DIAGNOSIS — R309 Painful micturition, unspecified: Secondary | ICD-10-CM | POA: Diagnosis not present

## 2022-12-04 DIAGNOSIS — I10 Essential (primary) hypertension: Secondary | ICD-10-CM | POA: Insufficient documentation

## 2022-12-04 DIAGNOSIS — I700 Atherosclerosis of aorta: Secondary | ICD-10-CM | POA: Insufficient documentation

## 2022-12-04 LAB — CBC WITH DIFFERENTIAL (CANCER CENTER ONLY)
Abs Immature Granulocytes: 0.15 10*3/uL — ABNORMAL HIGH (ref 0.00–0.07)
Basophils Absolute: 0.1 10*3/uL (ref 0.0–0.1)
Basophils Relative: 1 %
Eosinophils Absolute: 0.2 10*3/uL (ref 0.0–0.5)
Eosinophils Relative: 2 %
HCT: 37.9 % (ref 36.0–46.0)
Hemoglobin: 12.4 g/dL (ref 12.0–15.0)
Immature Granulocytes: 2 %
Lymphocytes Relative: 9 %
Lymphs Abs: 0.9 10*3/uL (ref 0.7–4.0)
MCH: 30.1 pg (ref 26.0–34.0)
MCHC: 32.7 g/dL (ref 30.0–36.0)
MCV: 92 fL (ref 80.0–100.0)
Monocytes Absolute: 1 10*3/uL (ref 0.1–1.0)
Monocytes Relative: 10 %
Neutro Abs: 7.5 10*3/uL (ref 1.7–7.7)
Neutrophils Relative %: 76 %
Platelet Count: 217 10*3/uL (ref 150–400)
RBC: 4.12 MIL/uL (ref 3.87–5.11)
RDW: 15.1 % (ref 11.5–15.5)
WBC Count: 9.7 10*3/uL (ref 4.0–10.5)
nRBC: 0 % (ref 0.0–0.2)

## 2022-12-04 LAB — CMP (CANCER CENTER ONLY)
ALT: 19 U/L (ref 0–44)
AST: 13 U/L — ABNORMAL LOW (ref 15–41)
Albumin: 3.6 g/dL (ref 3.5–5.0)
Alkaline Phosphatase: 52 U/L (ref 38–126)
Anion gap: 5 (ref 5–15)
BUN: 32 mg/dL — ABNORMAL HIGH (ref 8–23)
CO2: 24 mmol/L (ref 22–32)
Calcium: 8.5 mg/dL — ABNORMAL LOW (ref 8.9–10.3)
Chloride: 109 mmol/L (ref 98–111)
Creatinine: 1.41 mg/dL — ABNORMAL HIGH (ref 0.44–1.00)
GFR, Estimated: 38 mL/min — ABNORMAL LOW (ref >60)
Glucose, Bld: 136 mg/dL — ABNORMAL HIGH (ref 70–99)
Potassium: 4.5 mmol/L (ref 3.5–5.1)
Sodium: 138 mmol/L (ref 135–145)
Total Bilirubin: 0.4 mg/dL (ref 0.3–1.2)
Total Protein: 6.1 g/dL — ABNORMAL LOW (ref 6.5–8.1)

## 2022-12-04 LAB — URINALYSIS, COMPLETE (UACMP) WITH MICROSCOPIC
Bilirubin Urine: NEGATIVE
Glucose, UA: >=500 mg/dL — AB
Ketones, ur: NEGATIVE mg/dL
Nitrite: NEGATIVE
Protein, ur: NEGATIVE mg/dL
Specific Gravity, Urine: 1.007 (ref 1.005–1.030)
WBC, UA: >50 WBC/hpf (ref 0–5)
pH: 6 (ref 5.0–8.0)

## 2022-12-04 LAB — TSH: TSH: 0.855 u[IU]/mL (ref 0.350–4.500)

## 2022-12-04 MED ORDER — HEPARIN SOD (PORK) LOCK FLUSH 100 UNIT/ML IV SOLN
500.0000 [IU] | Freq: Once | INTRAVENOUS | Status: AC | PRN
  Administered 2022-12-04: 500 [IU]

## 2022-12-04 MED ORDER — SODIUM CHLORIDE 0.9 % IV SOLN
240.0000 mg | Freq: Once | INTRAVENOUS | Status: AC
  Administered 2022-12-04: 240 mg via INTRAVENOUS
  Filled 2022-12-04: qty 24

## 2022-12-04 MED ORDER — SODIUM CHLORIDE 0.9 % IV SOLN: Freq: Once | INTRAVENOUS | Status: AC

## 2022-12-04 MED ORDER — SODIUM CHLORIDE 0.9% FLUSH
10.0000 mL | INTRAVENOUS | Status: DC | PRN
  Administered 2022-12-04: 10 mL

## 2022-12-04 MED ORDER — SODIUM CHLORIDE 0.9% FLUSH
10.0000 mL | Freq: Once | INTRAVENOUS | Status: AC
  Administered 2022-12-04: 10 mL

## 2022-12-04 NOTE — Progress Notes (Signed)
[facility] Cancer Follow up:   DOS: 11/06/22  PCP Exie Parody, MD [address] [place] [address]  DIAGNOSIS: metastatic renal cell carcinoma   SUMMARY OF ONCOLOGIC HISTORY: Oncology History  Cancer, 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HCC)  01/10/2017 Initial Diagnosis   Cancer, 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HCC)   01/02/2018 - 01/16/2022 Chemotherapy   Patient is on Treatment Plan : RENAL CELL CARCINOMA NIVOLUMAB Q14D     01/22/2022 Cancer Staging   Staging form: Bone - Appendicular Skeleton, Trunk, Skull, and Facial Bones, AJCC 8th Edition - Clinical: Stage IVB (pM1b) - Signed by Johney Maine, MD on 01/22/2022   02/01/2022 -  Chemotherapy   Patient is on Treatment Plan : RENAL CELL CARCINOMA Nivolumab (240) q14d/     Renal cell carcinoma, left (HCC)  04/11/2017 Initial Diagnosis   Renal cell carcinoma (HCC)   01/02/2018 - 01/16/2022 Chemotherapy   The patient had nivolumab (OPDIVO) 240 mg in sodium chloride 0.9 % 100 mL chemo infusion, 240 mg, Intravenous, Once, 47 of 50 cycles Administration: 240 mg (01/02/2018), 240 mg (01/15/2018), 240 mg (01/30/2018), 240 mg (02/13/2018), 240 mg (02/27/2018), 240 mg (03/13/2018), 240 mg (03/27/2018), 240 mg (04/10/2018), 240 mg (04/24/2018), 240 mg (05/08/2018), 240 mg (05/22/2018), 240 mg (06/05/2018), 240 mg (06/19/2018), 240 mg (08/28/2018), 240 mg (09/11/2018), 240 mg (10/09/2018), 240 mg (10/22/2018), 240 mg (11/06/2018), 240 mg (11/20/2018), 240 mg (12/04/2018), 240 mg (12/18/2018), 240 mg (01/01/2019), 240 mg (01/15/2019), 240 mg (01/29/2019), 240 mg (02/12/2019), 240 mg (02/26/2019), 240 mg (03/12/2019), 240 mg (03/26/2019), 240 mg (04/09/2019), 240 mg (04/28/2019), 240 mg (05/12/2019), 240 mg (05/27/2019), 240 mg (06/09/2019), 240 mg (06/23/2019), 240 mg (07/07/2019), 240 mg (07/21/2019), 240 mg (08/04/2019), 240 mg (08/18/2019), 240 mg (09/03/2019), 240 mg (09/15/2019), 240 mg (09/29/2019), 240 mg (10/12/2019), 240 mg (10/27/2019), 240 mg (11/10/2019), 240 mg  (12/08/2019), 240 mg (12/22/2019), 240 mg (01/19/2020)  for chemotherapy treatment.    02/01/2022 -  Chemotherapy   Patient is on Treatment Plan : RENAL CELL CARCINOMA Nivolumab (240) q14d/       CURRENT THERAPY: Nivolumab q 14 days, Xgeva q 6 weeks  INTERVAL HISTORY:  Audrey [name] 82 y.o. female is here for continued evaluation and management of metastatic renal cell carcinoma. She is here to continue her Nivolumab fherapy.Patient was last seen on 11/20/2022. She presents to the clinic unaccompanied.   [name] reports she reports she has noticed her eyelids are starting to droop after undergoing cataract surgery which impacts her visual field. She reports her energy and appetite are stable. She denies nausea, vomiting or bowel habit changes. She denies easy bruising or signs of bleeding. She reports swelling in her legs has improved. She continues to have increased urinary frequency and burning with urination. She denies fevers, chills, sweats, shortness of breath, chest pain or cough. She has no other complaints.  A full 10 point ROS was otherwise negative.  Patient Active Problem List   Diagnosis Date Noted   PAD (peripheral artery disease) (HCC) 10/21/2022   Atrophy of thyroid (acquired) 01/15/2022   At risk for falls 01/15/2022   CKD (chronic kidney disease) 01/15/2022   Diabetic ulcer of foot with fat layer exposed (HCC) 01/15/2022   Dizziness 01/15/2022   Dysuria 01/15/2022   Knee osteoarthritis 01/15/2022   Pulmonary nodule 01/15/2022   Type 2 diabetes mellitus (HCC) 08/15/2021   Incisional hernia 06/23/2020   Gastritis and gastroduodenitis 02/26/2020   Rectal discomfort 02/26/2020   History of colonic  polyps 02/26/2020   Ventral hernia 11/10/2019   History of cholecystectomy 07/17/2019   Degeneration of lumbar intervertebral disc 11/12/2018   Anemia 10/10/2018   Chronic cholecystitis 09/04/2018   Acute cholecystitis 08/29/2018   Biliary stricture 08/23/2018   Medicare  annual wellness visit, subsequent 08/14/2018   CAD (coronary artery disease) 08/14/2018   Dyslipidemia 08/14/2018   Abnormal LFTs 08/05/2018   Choledocholithiasis 08/05/2018   Dilated bile duct 08/05/2018   History of ERCP 08/05/2018   History of renal cell carcinoma 08/05/2018   Port-A-Cath in place 01/30/2018   Counseling regarding advance care planning and goals of care 01/04/2018   Renal cell carcinoma, left (HCC) 04/11/2017   Cancer, 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HCC) 01/10/2017   Left renal mass 08/01/2016   Hyperlipidemia 05/23/2015   Essential hypertension 05/23/2015   Hypothyroidism 05/23/2015    is allergic to doxycycline, adhesive [tape], duloxetine, gabapentin (once-daily), nsaids, and statins.  MEDICAL HISTORY: Past Medical History:  Diagnosis Date   Anemia    Arthritis    lower back, hips, hands   Biliary stricture    Diabetes mellitus (HCC)    type 2    Early cataracts, bilateral    Md just watching   Elevated liver enzymes    Family history of adverse reaction to anesthesia    Daughter hard to wake up   Gallstones    GERD (gastroesophageal reflux disease)    occasional - diet controlled   History of blood transfusion 2018   History of hiatal hernia    HTN (hypertension)    Hyperlipidemia    Hypothyroidism    left renal ca dx'd 2018   renal cancer - left kidney removed, pill chemo x 1 yr   Myocardial infarction (HCC) 1991   no deficits   SVD (spontaneous vaginal delivery)    x 3   Wears glasses     SURGICAL HISTORY: Past Surgical History:  Procedure Laterality Date   ABDOMINAL AORTOGRAM W/LOWER EXTREMITY N/A 10/21/2022   Procedure: ABDOMINAL AORTOGRAM W/LOWER EXTREMITY;  Surgeon: Maeola Harman, MD;  Location: [facility] INVASIVE CV LAB;  Service: Cardiovascular;  Laterality: N/A;   BALLOON DILATION N/A 07/23/2018   Procedure: BALLOON DILATION;  Surgeon: Meridee Score Netty Starring., MD;  Location: [facility] ENDOSCOPY;  Service: Gastroenterology;  Laterality: N/A;    BILIARY BRUSHING  08/10/2018   Procedure: BILIARY BRUSHING;  Surgeon: Meridee Score Netty Starring., MD;  Location: [facility] ENDOSCOPY;  Service: Gastroenterology;;   BILIARY BRUSHING  11/30/2018   Procedure: BILIARY BRUSHING;  Surgeon: Lemar Lofty., MD;  Location: [facility] ENDOSCOPY;  Service: Gastroenterology;;   BILIARY DILATION  08/10/2018   Procedure: BILIARY DILATION;  Surgeon: Lemar Lofty., MD;  Location: [facility] ENDOSCOPY;  Service: Gastroenterology;;   BILIARY DILATION  08/27/2018   Procedure: BILIARY DILATION;  Surgeon: Lemar Lofty., MD;  Location: [facility] ENDOSCOPY;  Service: Gastroenterology;;   BILIARY DILATION  01/24/2020   Procedure: BILIARY DILATION;  Surgeon: Lemar Lofty., MD;  Location: [facility] ENDOSCOPY;  Service: Gastroenterology;;   BILIARY STENT PLACEMENT  08/10/2018   Procedure: BILIARY STENT PLACEMENT;  Surgeon: Lemar Lofty., MD;  Location: [facility] ENDOSCOPY;  Service: Gastroenterology;;   BILIARY STENT PLACEMENT  08/27/2018   Procedure: BILIARY STENT PLACEMENT;  Surgeon: Lemar Lofty., MD;  Location: [facility] ENDOSCOPY;  Service: Gastroenterology;;   BILIARY STENT PLACEMENT  11/30/2018   Procedure: BILIARY STENT PLACEMENT;  Surgeon: Lemar Lofty., MD;  Location: [facility] ENDOSCOPY;  Service: Gastroenterology;;   BIOPSY  07/23/2018   Procedure: BIOPSY;  Surgeon: Lemar Lofty., MD;  Location: [facility] ENDOSCOPY;  Service: Gastroenterology;;   BIOPSY  01/24/2020   Procedure: BIOPSY;  Surgeon: Lemar Lofty., MD;  Location: [facility] ENDOSCOPY;  Service: Gastroenterology;;   CHOLECYSTECTOMY N/A 09/02/2018   Procedure: LAPAROSCOPIC CHOLECYSTECTOMY;  Surgeon: Almond Lint, MD;  Location: [facility] OR;  Service: General;  Laterality: N/A;   COLONOSCOPY     normal    ENDOSCOPIC RETROGRADE CHOLANGIOPANCREATOGRAPHY (ERCP) WITH PROPOFOL N/A 08/10/2018   Procedure: ENDOSCOPIC RETROGRADE CHOLANGIOPANCREATOGRAPHY (ERCP) WITH PROPOFOL;  Surgeon: Lemar Lofty.,  MD;  Location: [facility] ENDOSCOPY;  Service: Gastroenterology;  Laterality: N/A;   ENDOSCOPIC RETROGRADE CHOLANGIOPANCREATOGRAPHY (ERCP) WITH PROPOFOL N/A 11/30/2018   Procedure: ENDOSCOPIC RETROGRADE CHOLANGIOPANCREATOGRAPHY (ERCP) WITH PROPOFOL;  Surgeon: Meridee Score Netty Starring., MD;  Location: [facility] ENDOSCOPY;  Service: Gastroenterology;  Laterality: N/A;   ENDOSCOPIC RETROGRADE CHOLANGIOPANCREATOGRAPHY (ERCP) WITH PROPOFOL N/A 01/24/2020   Procedure: ENDOSCOPIC RETROGRADE CHOLANGIOPANCREATOGRAPHY (ERCP) WITH PROPOFOL;  Surgeon: Meridee Score Netty Starring., MD;  Location: [facility] ENDOSCOPY;  Service: Gastroenterology;  Laterality: N/A;   ERCP N/A 07/23/2018   Procedure: ENDOSCOPIC RETROGRADE CHOLANGIOPANCREATOGRAPHY (ERCP);  Surgeon: Lemar Lofty., MD;  Location: [facility] ENDOSCOPY;  Service: Gastroenterology;  Laterality: N/A;   ERCP N/A 08/27/2018   Procedure: ENDOSCOPIC RETROGRADE CHOLANGIOPANCREATOGRAPHY (ERCP);  Surgeon: Lemar Lofty., MD;  Location: [facility] ENDOSCOPY;  Service: Gastroenterology;  Laterality: N/A;   ESOPHAGOGASTRODUODENOSCOPY N/A 01/24/2020   Procedure: ESOPHAGOGASTRODUODENOSCOPY (EGD);  Surgeon: Lemar Lofty., MD;  Location: [facility] ENDOSCOPY;  Service: Gastroenterology;  Laterality: N/A;   ESOPHAGOGASTRODUODENOSCOPY (EGD) WITH PROPOFOL N/A 08/27/2018   Procedure: ESOPHAGOGASTRODUODENOSCOPY (EGD) WITH PROPOFOL;  Surgeon: Meridee Score Netty Starring., MD;  Location: [facility] ENDOSCOPY;  Service: Gastroenterology;  Laterality: N/A;   EUS N/A 08/27/2018   Procedure: ESOPHAGEAL ENDOSCOPIC ULTRASOUND (EUS) RADIAL;  Surgeon: Meridee Score Netty Starring., MD;  Location: [facility] ENDOSCOPY;  Service: Gastroenterology;  Laterality: N/A;   FINE NEEDLE ASPIRATION  08/27/2018   Procedure: FINE NEEDLE ASPIRATION (FNA) LINEAR;  Surgeon: Lemar Lofty., MD;  Location: [facility] ENDOSCOPY;  Service: Gastroenterology;;   Sherald Hess HERNIA REPAIR N/A 06/23/2020   Procedure: OPEN INCISIONAL HERNIA REPAIR WITH MESH;  Surgeon:  Quentin Ore, MD;  Location: [facility] ORS;  Service: General;  Laterality: N/A;  ROOM 2 STARTING AT 11:00AM FOR 90 MIN   IR IMAGING GUIDED PORT INSERTION  01/08/2018   LAPAROSCOPIC NEPHRECTOMY Left 08/01/2016   Procedure: LAPAROSCOPIC  RADICAL NEPHRECTOMY/ REPAIR OF UMBILICAL HERNIA;  Surgeon: Heloise Purpura, MD;  Location: [facility] ORS;  Service: Urology;  Laterality: Left;   PERIPHERAL VASCULAR INTERVENTION  10/21/2022   Procedure: PERIPHERAL VASCULAR INTERVENTION;  Surgeon: Maeola Harman, MD;  Location: [facility] INVASIVE CV LAB;  Service: Cardiovascular;;   REMOVAL OF STONES  07/23/2018   Procedure: REMOVAL OF GALL STONES;  Surgeon: Meridee Score Netty Starring., MD;  Location: [facility] ENDOSCOPY;  Service: Gastroenterology;;   REMOVAL OF STONES  08/10/2018   Procedure: REMOVAL OF STONES;  Surgeon: Lemar Lofty., MD;  Location: [facility] ENDOSCOPY;  Service: Gastroenterology;;   REMOVAL OF STONES  08/27/2018   Procedure: REMOVAL OF STONES;  Surgeon: Lemar Lofty., MD;  Location: [facility] ENDOSCOPY;  Service: Gastroenterology;;   REMOVAL OF STONES  01/24/2020   Procedure: REMOVAL OF STONES;  Surgeon: Lemar Lofty., MD;  Location: [facility] ENDOSCOPY;  Service: Gastroenterology;;   Dennison Mascot  07/23/2018   Procedure: Dennison Mascot;  Surgeon: Lemar Lofty., MD;  Location: [facility] ENDOSCOPY;  Service: Gastroenterology;;   Francine Graven REMOVAL  08/27/2018   Procedure: STENT REMOVAL;  Surgeon: Lemar Lofty., MD;  Location: [facility] ENDOSCOPY;  Service: Gastroenterology;;   Francine Graven REMOVAL  11/30/2018   Procedure: STENT REMOVAL;  Surgeon: Lemar Lofty., MD;  Location: [facility] ENDOSCOPY;  Service: Gastroenterology;;   Francine Graven REMOVAL  01/24/2020   Procedure: STENT REMOVAL;  Surgeon: Lemar Lofty., MD;  Location: [facility] ENDOSCOPY;  Service: Gastroenterology;;   UPPER GI ENDOSCOPY     x 1   VAGINAL PROLAPSE REPAIR  11/18/2019   [facility]    SOCIAL HISTORY: Social History   Socioeconomic History    Marital status: Widowed    Spouse name: Not on file   Number of children: 3   Years of education: Not on file   Highest education level: Not on file  Occupational History   Occupation: retired  Tobacco Use   Smoking status: Former    Current packs/day: 0.00    Average packs/day: 1 pack/day for 8.0 years (8.0 ttl pk-yrs)    Types: Cigarettes    Start date: 06/18/1956    Quit date: 06/18/1964    Years since quitting: 58.5   Smokeless tobacco: Never  Vaping Use   Vaping status: Never Used  Substance and Sexual Activity   Alcohol use: No   Drug use: No   Sexual activity: Not Currently    Birth control/protection: Post-menopausal  Other Topics Concern   Not on file  Social History Narrative   Married.  Three children   Social Determinants of Health   Financial Resource Strain: Low Risk  (05/23/2022)   Overall Financial Resource Strain (CARDIA)    Difficulty of Paying Living Expenses: Not hard at all  Food Insecurity: No Food Insecurity (05/23/2022)   Hunger Vital Sign    Worried About Running Out of Food in the Last Year: Never true    Ran Out of Food in the Last Year: Never true  Transportation Needs: No Transportation Needs (05/23/2022)   PRAPARE - Administrator, Civil Service (Medical): No    Lack of Transportation (Non-Medical): No  Physical Activity: Not on file  Stress: Not on file  Social Connections: Not on file  Intimate Partner Violence: Not At Risk (05/23/2022)   Humiliation, Afraid, Rape, and Kick questionnaire    Fear of Current or Ex-Partner: No    Emotionally Abused: No    Physically Abused: No    Sexually Abused: No    FAMILY HISTORY: Family History  Problem Relation Age of Onset   Heart attack Father 72   Heart disease Brother 26       CABG   Colon cancer Neg Hx    Esophageal cancer Neg Hx    Inflammatory bowel disease Neg Hx    Liver disease Neg Hx    Pancreatic cancer Neg Hx    Rectal cancer Neg Hx    Stomach cancer Neg Hx       ROS  10 Point review of Systems was done is negative except as noted above.   PHYSICAL EXAMINATION Vital signs reviewed Vitals:   12/04/22 1137  BP: (!) 146/71  Pulse: 67  Temp: 97.9 F (36.6 C)  SpO2: 100%    GENERAL: Well-appearing elderly Caucasian female, alert, in no acute distress and comfortable SKIN: no acute rashes, no significant lesions EYES: conjunctiva are pink and non-injected, sclera anicteric LYMPH:  no palpable lymphadenopathy in the cervical or supraclavicular regions LUNGS: clear to auscultation b/l with normal respiratory effort HEART: regular rate & rhythm PSYCH: alert & oriented x 3 with fluent speech NEURO: no focal  motor/sensory deficits  EXTREMITIES: Trace swelling of the lower extremities, right greater than left.  No erythema.   LABORATORY DATA: .    Latest Ref Rng & Units 12/04/2022   11:13 AM 11/20/2022   11:57 AM 11/06/2022    2:05 PM  CBC  WBC 4.0 - 10.5 K/uL 9.7  9.3  9.9   Hemoglobin 12.0 - 15.0 g/dL 09.6  04.5  40.9   Hematocrit 36.0 - 46.0 % 37.9  38.5  34.5   Platelets 150 - 400 K/uL 217  272  315    .    Latest Ref Rng & Units 12/04/2022   11:13 AM 11/20/2022   11:57 AM 11/06/2022    2:05 PM  CMP  Glucose 70 - 99 mg/dL 811  914  782   BUN 8 - 23 mg/dL 32  38  35   Creatinine 0.44 - 1.00 mg/dL 9.56  2.13  0.86   Sodium 135 - 145 mmol/L 138  137  137   Potassium 3.5 - 5.1 mmol/L 4.5  4.4  4.3   Chloride 98 - 111 mmol/L 109  111  107   CO2 22 - 32 mmol/L 24  19  20    Calcium 8.9 - 10.3 mg/dL 8.5  9.7  9.7   Total Protein 6.5 - 8.1 g/dL 6.1  6.4  6.3   Total Bilirubin 0.3 - 1.2 mg/dL 0.4  0.5  0.6   Alkaline Phos 38 - 126 U/L 52  56  60   AST 15 - 41 U/L 13  14  14    ALT 0 - 44 U/L 19  17  12     RADIOLOGY    CLINICAL DATA:  Subsequent treatment strategy for metastatic renal cell carcinoma.   EXAM: NUCLEAR MEDICINE PET SKULL BASE TO THIGH   TECHNIQUE: 6.32 mCi F-18 FDG was injected intravenously. Full-ring PET  imaging was performed from the skull base to thigh after the radiotracer. CT data was obtained and used for attenuation correction and anatomic localization.   Fasting blood glucose: 159 mg/dl   COMPARISON:  PET-CT 57/84/6962 and 02/09/2020.   FINDINGS: Mediastinal blood pool activity: SUV max 2.7   NECK:   No hypermetabolic cervical lymph nodes are identified.There are no lesions of the pharyngeal mucosal space. Activity associated with the muscles of phonation is within physiologic limits and similar to previous study.   Incidental CT findings: Bilateral carotid atherosclerosis.   CHEST:   There are no hypermetabolic mediastinal, hilar or axillary lymph nodes. No hypermetabolic pulmonary activity or suspicious nodularity.   Incidental CT findings: Right IJ Port-A-Cath extends to the superior cavoatrial junction. There is diffuse atherosclerosis of the aorta, great vessels and coronary arteries. Mild linear scarring at both lung bases appears unchanged.   ABDOMEN/PELVIS:   There is no hypermetabolic activity within the liver, adrenal glands, spleen or pancreas. There is no hypermetabolic nodal activity. Stable appearance of the right kidney. No hypermetabolic activity within the left nephrectomy bed   Incidental CT findings: Diffuse aortic and branch vessel atherosclerosis, scattered colonic diverticulosis and pelvic floor laxity are again noted.   SKELETON:   There is no hypermetabolic activity to suggest osseous metastatic disease.   Incidental CT findings: Grossly stable sclerotic lesions within the thoracic spine, without hypermetabolic activity. Lower lumbar spondylosis and sacroiliac degenerative changes bilaterally.   IMPRESSION: 1. Stable PET-CT status post left nephrectomy. No evidence of local recurrence or hypermetabolic metastatic disease. 2. Stable incidental findings including coronary and Aortic Atherosclerosis (  ICD10-I70.0). Pelvic floor  laxity.     Electronically Signed   By: Carey Bullocks M.D.   On: 04/03/2021 12:30      ASSESSMENT and PLAN:   SEDA TISCHNER is a 82 y.o. female who returns for a follow up for metastatic renal cell carcinoma.   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 -Rt adrenal gland bx - showed clear cell RCC -S/p Cytoreductive left radical nephrectomy and left adrenal gland resection on 08/01/2016 by Dr Laverle Patter. -Started systemic therapy with Nivolumab 240 mg on 01/02/2018 and Xgeva on 02/27/2018.  -Most recent PET scan from 07/03/2022 revealed no progression or metastatic disease.   # Hypothyroidism/Adrenal insufficiency/Diabetes -Continue being followed by Dr. Sharl Ma   #H/o Choledocholithiasis and cholelithiasis -07/10/18 US Abdomen revealed Biliary duct dilatation with 8 mm calculus at the level of the ampulla in the distal common bile duct. 2. Cholelithiasis. No gallbladder wall thickening or pericholecystic fluid. 3. Appearance of the liver raises concern for underlying hepatic cirrhosis. No focal liver lesions are demonstrable. 4.  Left kidney absent. 5.  Small cysts in right kidney. 6.  Aortic Atherosclerosis. -S/p ERCP on 07/23/18 with Dr. Vicente Serene Mansouraty -09/02/18 Cytopathology from ERCP was suspicious for malignant cells in bile duct, but was not definitive, other two findings were benign.  #Right foot wound 2/2 PAD and DM: --Under the care of  wound care from [facility] in [place]. --Recently admitted from 10/21/2022-10/22/2022 due to chronic right lower extremity limb threatening ischemia with wounds. Patient underwent balloon angioplasty with stent of right popliteal and SFA.   PLAN:   -Due for next dose of Nivolumab treatment. -Reviewed labs from today that require no intervention. WBC 9.7, Hgb 12.4, MCV 92.0, Plt 217. Stable creatinine at 1.41. LFTs normal.  -Proceed with Nivolumab treatment  today without dose modifications -Will obtain UA/culture to evaluate for UTI due to urinary symptoms.  -Will plan for repeat PET scan in the next 4 weeks.  -RTC in 2 weeks for labs and next treatment of Nivolumab.and 4 weeks for labs/follow up visit.   FOLLOW-UP: Per integrated scheduling  All of the patient's questions were answered with apparent satisfaction. The patient knows to call the clinic with any problems, questions or concerns.  I have spent a total of 30 minutes minutes of face-to-face and non-face-to-face time, preparing to see the patient,performing a medically appropriate examination, counseling and educating the patient,referring and communicating with other health care professionals, documenting clinical information in the electronic health record, and care coordination.   Georga Kaufmann PA-C Dept of Hematology and Oncology [facility] Phone: [phone]

## 2022-12-05 ENCOUNTER — Other Ambulatory Visit: Payer: Self-pay

## 2022-12-05 LAB — T4: T4, Total: 7.8 ug/dL (ref 4.5–12.0)

## 2022-12-06 ENCOUNTER — Encounter: Payer: Self-pay | Admitting: Physician Assistant

## 2022-12-06 LAB — URINE CULTURE: Culture: <10000 — AB

## 2022-12-09 ENCOUNTER — Telehealth: Payer: Self-pay

## 2022-12-09 NOTE — Telephone Encounter (Signed)
Pt advised with VU 

## 2022-12-09 NOTE — Telephone Encounter (Signed)
-----   Message from Briant Cedar sent at 12/09/2022  9:12 AM EDT ----- Please notify patient that urine studies didn't show evidence of UTI. ----- Message ----- From: Interface, Lab In [place] Sent: 12/04/2022   3:35 PM EDT To: Briant Cedar, PA-C

## 2022-12-13 ENCOUNTER — Telehealth: Payer: Self-pay

## 2022-12-13 NOTE — Telephone Encounter (Signed)
Called pt regarding her groin area that she sent picture of. Pt states there is some drainage and it has an odor. Picture is very blurry but does not look like it has redness around it. Pt has been offered an appt Monday but states she can't come until Wed., due to her car being in the shop and living an hour from office I have offered pt a thurs appt and she wants to see if she can get in with her PCP. She states she has chemo also on Wed. And is trying to avoid going "back and forth". I have advised pt this needs to definitely be looked at soon and to call us if she is unable to see her PCP so we can get her in with [name]. She verbalized understanding.

## 2022-12-18 ENCOUNTER — Inpatient Hospital Stay (HOSPITAL_BASED_OUTPATIENT_CLINIC_OR_DEPARTMENT_OTHER): Payer: Medicare Other | Admitting: Physician Assistant

## 2022-12-18 ENCOUNTER — Inpatient Hospital Stay: Payer: Medicare Other

## 2022-12-18 ENCOUNTER — Other Ambulatory Visit: Payer: Self-pay | Admitting: Physician Assistant

## 2022-12-18 VITALS — BP 142/61 | HR 59 | Temp 97.5°F | Resp 16 | Wt 122.5 lb

## 2022-12-18 VITALS — BP 130/65 | HR 65 | Temp 98.1°F | Resp 17

## 2022-12-18 DIAGNOSIS — C642 Malignant neoplasm of left kidney, except renal pelvis: Secondary | ICD-10-CM

## 2022-12-18 DIAGNOSIS — L732 Hidradenitis suppurativa: Secondary | ICD-10-CM | POA: Diagnosis not present

## 2022-12-18 DIAGNOSIS — C7951 Secondary malignant neoplasm of bone: Secondary | ICD-10-CM

## 2022-12-18 DIAGNOSIS — Z5112 Encounter for antineoplastic immunotherapy: Secondary | ICD-10-CM | POA: Diagnosis not present

## 2022-12-18 DIAGNOSIS — Z95828 Presence of other vascular implants and grafts: Secondary | ICD-10-CM

## 2022-12-18 LAB — CMP (CANCER CENTER ONLY)
ALT: 9 U/L (ref 0–44)
AST: 9 U/L — ABNORMAL LOW (ref 15–41)
Albumin: 3.8 g/dL (ref 3.5–5.0)
Alkaline Phosphatase: 49 U/L (ref 38–126)
Anion gap: 6 (ref 5–15)
BUN: 31 mg/dL — ABNORMAL HIGH (ref 8–23)
CO2: 18 mmol/L — ABNORMAL LOW (ref 22–32)
Calcium: 7.9 mg/dL — ABNORMAL LOW (ref 8.9–10.3)
Chloride: 115 mmol/L — ABNORMAL HIGH (ref 98–111)
Creatinine: 1.56 mg/dL — ABNORMAL HIGH (ref 0.44–1.00)
GFR, Estimated: 33 mL/min — ABNORMAL LOW (ref >60)
Glucose, Bld: 135 mg/dL — ABNORMAL HIGH (ref 70–99)
Potassium: 4.1 mmol/L (ref 3.5–5.1)
Sodium: 139 mmol/L (ref 135–145)
Total Bilirubin: 0.4 mg/dL (ref 0.3–1.2)
Total Protein: 6.4 g/dL — ABNORMAL LOW (ref 6.5–8.1)

## 2022-12-18 LAB — CBC WITH DIFFERENTIAL (CANCER CENTER ONLY)
Abs Immature Granulocytes: 0.1 10*3/uL — ABNORMAL HIGH (ref 0.00–0.07)
Basophils Absolute: 0.1 10*3/uL (ref 0.0–0.1)
Basophils Relative: 1 %
Eosinophils Absolute: 0.1 10*3/uL (ref 0.0–0.5)
Eosinophils Relative: 1 %
HCT: 41.2 % (ref 36.0–46.0)
Hemoglobin: 13 g/dL (ref 12.0–15.0)
Immature Granulocytes: 1 %
Lymphocytes Relative: 11 %
Lymphs Abs: 1 10*3/uL (ref 0.7–4.0)
MCH: 28.9 pg (ref 26.0–34.0)
MCHC: 31.6 g/dL (ref 30.0–36.0)
MCV: 91.6 fL (ref 80.0–100.0)
Monocytes Absolute: 1 10*3/uL (ref 0.1–1.0)
Monocytes Relative: 10 %
Neutro Abs: 6.9 10*3/uL (ref 1.7–7.7)
Neutrophils Relative %: 76 %
Platelet Count: 239 10*3/uL (ref 150–400)
RBC: 4.5 MIL/uL (ref 3.87–5.11)
RDW: 14.6 % (ref 11.5–15.5)
WBC Count: 9.1 10*3/uL (ref 4.0–10.5)
nRBC: 0 % (ref 0.0–0.2)

## 2022-12-18 LAB — TSH: TSH: 1.164 u[IU]/mL (ref 0.350–4.500)

## 2022-12-18 MED ORDER — MUPIROCIN CALCIUM 2 % EX CREA: 1.0000 | TOPICAL_CREAM | Freq: Two times a day (BID) | CUTANEOUS | 0 refills | Status: DC

## 2022-12-18 MED ORDER — SODIUM CHLORIDE 0.9% FLUSH
10.0000 mL | Freq: Once | INTRAVENOUS | Status: AC
  Administered 2022-12-18: 10 mL

## 2022-12-18 MED ORDER — SODIUM CHLORIDE 0.9% FLUSH
10.0000 mL | INTRAVENOUS | Status: DC | PRN
  Administered 2022-12-18: 10 mL

## 2022-12-18 MED ORDER — SODIUM CHLORIDE 0.9 % IV SOLN: Freq: Once | INTRAVENOUS | Status: AC

## 2022-12-18 MED ORDER — SODIUM CHLORIDE 0.9 % IV SOLN
240.0000 mg | Freq: Once | INTRAVENOUS | Status: AC
  Administered 2022-12-18: 240 mg via INTRAVENOUS
  Filled 2022-12-18: qty 24

## 2022-12-18 MED ORDER — HEPARIN SOD (PORK) LOCK FLUSH 100 UNIT/ML IV SOLN
500.0000 [IU] | Freq: Once | INTRAVENOUS | Status: AC | PRN
  Administered 2022-12-18: 500 [IU]

## 2022-12-18 NOTE — Patient Instructions (Signed)
[facility]  Discharge Instructions: Thank you for choosing [facility] to provide your oncology and hematology care.   If you have a lab appointment with the [facility], please go directly to the [facility] and check in at the registration area.   Wear comfortable clothing and clothing appropriate for easy access to any Portacath or PICC line.   We strive to give you quality time with your provider. You may need to reschedule your appointment if you arrive late (15 or more minutes).  Arriving late affects you and other patients whose appointments are after yours.  Also, if you miss three or more appointments without notifying the office, you may be dismissed from the clinic at the provider's discretion.      For prescription refill requests, have your pharmacy contact our office and allow 72 hours for refills to be completed.    Today you received the following chemotherapy and/or immunotherapy agents: Opdivo      To help prevent nausea and vomiting after your treatment, we encourage you to take your nausea medication as directed.  BELOW ARE SYMPTOMS THAT SHOULD BE REPORTED IMMEDIATELY: *FEVER GREATER THAN 100.4 F (38 C) OR HIGHER *CHILLS OR SWEATING *NAUSEA AND VOMITING THAT IS NOT CONTROLLED WITH YOUR NAUSEA MEDICATION *UNUSUAL SHORTNESS OF BREATH *UNUSUAL BRUISING OR BLEEDING *URINARY PROBLEMS (pain or burning when urinating, or frequent urination) *BOWEL PROBLEMS (unusual diarrhea, constipation, pain near the anus) TENDERNESS IN MOUTH AND THROAT WITH OR WITHOUT PRESENCE OF ULCERS (sore throat, sores in mouth, or a toothache) UNUSUAL RASH, SWELLING OR PAIN  UNUSUAL VAGINAL DISCHARGE OR ITCHING   Items with * indicate a potential emergency and should be followed up as soon as possible or go to the Emergency Department if any problems should occur.  Please show the CHEMOTHERAPY ALERT CARD or IMMUNOTHERAPY ALERT CARD at check-in  to the Emergency Department and triage nurse.  Should you have questions after your visit or need to cancel or reschedule your appointment, please contact [facility]  Dept: [phone]  and follow the prompts.  Office hours are 8:00 a.m. to 4:30 p.m. Monday - Friday. Please note that voicemails left after 4:00 p.m. may not be returned until the following business day.  We are closed weekends and major holidays. You have access to a nurse at all times for urgent questions. Please call the main number to the clinic Dept: [phone] and follow the prompts.   For any non-urgent questions, you may also contact your provider using MyChart. We now offer e-Visits for anyone 45 and older to request care online for non-urgent symptoms. For details visit [email or web address].   Also download the MyChart app! Go to the app store, search "MyChart", open the app, select [facility], and log in with your MyChart username and password.

## 2022-12-18 NOTE — Progress Notes (Addendum)
Per [name], Audrey [name]  and treatment parameters ok to treat with creat 1.56 mg/dL and hold xgeva today as pt is having dental pain.

## 2022-12-18 NOTE — Progress Notes (Signed)
[name] Follow up:   DOS: 11/06/22  PCP Audrey Parody, MD [address] [place] [address]  DIAGNOSIS: metastatic renal cell carcinoma   SUMMARY OF ONCOLOGIC HISTORY: Oncology History  Cancer, 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HCC)  01/10/2017 Initial Diagnosis   Cancer, 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HCC)   01/02/2018 - 01/16/2022 Chemotherapy   Patient is on Treatment Plan : RENAL CELL CARCINOMA NIVOLUMAB Q14D     01/22/2022 Cancer Staging   Staging form: Bone - Appendicular Skeleton, Trunk, Skull, and Facial Bones, AJCC 8th Edition - Clinical: Stage IVB (pM1b) - Signed by Audrey Maine, MD on 01/22/2022   02/01/2022 -  Chemotherapy   Patient is on Treatment Plan : RENAL CELL CARCINOMA Nivolumab (240) q14d/     Renal cell carcinoma, left (HCC)  04/11/2017 Initial Diagnosis   Renal cell carcinoma (HCC)   01/02/2018 - 01/16/2022 Chemotherapy   The patient had nivolumab (OPDIVO) 240 mg in sodium chloride 0.9 % 100 mL chemo infusion, 240 mg, Intravenous, Once, 47 of 50 cycles Administration: 240 mg (01/02/2018), 240 mg (01/15/2018), 240 mg (01/30/2018), 240 mg (02/13/2018), 240 mg (02/27/2018), 240 mg (03/13/2018), 240 mg (03/27/2018), 240 mg (04/10/2018), 240 mg (04/24/2018), 240 mg (05/08/2018), 240 mg (05/22/2018), 240 mg (06/05/2018), 240 mg (06/19/2018), 240 mg (08/28/2018), 240 mg (09/11/2018), 240 mg (10/09/2018), 240 mg (10/22/2018), 240 mg (11/06/2018), 240 mg (11/20/2018), 240 mg (12/04/2018), 240 mg (12/18/2018), 240 mg (01/01/2019), 240 mg (01/15/2019), 240 mg (01/29/2019), 240 mg (02/12/2019), 240 mg (02/26/2019), 240 mg (03/12/2019), 240 mg (03/26/2019), 240 mg (04/09/2019), 240 mg (04/28/2019), 240 mg (05/12/2019), 240 mg (05/27/2019), 240 mg (06/09/2019), 240 mg (06/23/2019), 240 mg (07/07/2019), 240 mg (07/21/2019), 240 mg (08/04/2019), 240 mg (08/18/2019), 240 mg (09/03/2019), 240 mg (09/15/2019), 240 mg (09/29/2019), 240 mg (10/12/2019), 240 mg (10/27/2019), 240 mg (11/10/2019), 240 mg  (12/08/2019), 240 mg (12/22/2019), 240 mg (01/19/2020)  for chemotherapy treatment.    02/01/2022 -  Chemotherapy   Patient is on Treatment Plan : RENAL CELL CARCINOMA Nivolumab (240) q14d/       CURRENT THERAPY: Nivolumab q 14 days, Xgeva q 6 weeks  INTERVAL HISTORY:  Audrey [name] 82 y.o. female is here for continued evaluation and management of metastatic renal cell carcinoma. She is here to continue her Nivolumab fherapy.Patient was last seen on 12/04/2022. She presents to the clinic unaccompanied.   [name] reports she reports noticing boils in her left groin and right inner thigh. She adds that she has known diagnosis of hidradenitis suppurativa but has never received any treatment for it. She reports she still has some dropping of her eyelids after undergoing cataract surgery. Her energy and appetite are stable. She denies nausea, vomiting or bowel habit changes. She denies easy bruising or signs of bleeding. She denies fevers, chills, sweats, shortness of breath, chest pain or cough. She has no other complaints.  A full 10 point ROS was otherwise negative.  Patient Active Problem List   Diagnosis Date Noted   PAD (peripheral artery disease) (HCC) 10/21/2022   Atrophy of thyroid (acquired) 01/15/2022   At risk for falls 01/15/2022   CKD (chronic kidney disease) 01/15/2022   Diabetic ulcer of foot with fat layer exposed (HCC) 01/15/2022   Dizziness 01/15/2022   Dysuria 01/15/2022   Knee osteoarthritis 01/15/2022   Pulmonary nodule 01/15/2022   Type 2 diabetes mellitus (HCC) 08/15/2021   Incisional hernia 06/23/2020   Gastritis and gastroduodenitis 02/26/2020   Rectal discomfort 02/26/2020  History of colonic polyps 02/26/2020   Ventral hernia 11/10/2019   History of cholecystectomy 07/17/2019   Degeneration of lumbar intervertebral disc 11/12/2018   Anemia 10/10/2018   Chronic cholecystitis 09/04/2018   Acute cholecystitis 08/29/2018   Biliary stricture 08/23/2018   Medicare  annual wellness visit, subsequent 08/14/2018   CAD (coronary artery disease) 08/14/2018   Dyslipidemia 08/14/2018   Abnormal LFTs 08/05/2018   Choledocholithiasis 08/05/2018   Dilated bile duct 08/05/2018   History of ERCP 08/05/2018   History of renal cell carcinoma 08/05/2018   Port-A-Cath in place 01/30/2018   Counseling regarding advance care planning and goals of care 01/04/2018   Renal cell carcinoma, left (HCC) 04/11/2017   Cancer, 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HCC) 01/10/2017   Left renal mass 08/01/2016   Hyperlipidemia 05/23/2015   Essential hypertension 05/23/2015   Hypothyroidism 05/23/2015    is allergic to doxycycline, adhesive [tape], duloxetine, gabapentin (once-daily), nsaids, and statins.  MEDICAL HISTORY: Past Medical History:  Diagnosis Date   Anemia    Arthritis    lower back, hips, hands   Biliary stricture    Diabetes mellitus (HCC)    type 2    Early cataracts, bilateral    Md just watching   Elevated liver enzymes    Family history of adverse reaction to anesthesia    Daughter hard to wake up   Gallstones    GERD (gastroesophageal reflux disease)    occasional - diet controlled   History of blood transfusion 2018   History of hiatal hernia    HTN (hypertension)    Hyperlipidemia    Hypothyroidism    left renal ca dx'd 2018   renal cancer - left kidney removed, pill chemo x 1 yr   Myocardial infarction (HCC) 1991   no deficits   SVD (spontaneous vaginal delivery)    x 3   Wears glasses     SURGICAL HISTORY: Past Surgical History:  Procedure Laterality Date   ABDOMINAL AORTOGRAM W/LOWER EXTREMITY N/A 10/21/2022   Procedure: ABDOMINAL AORTOGRAM W/LOWER EXTREMITY;  Surgeon: Audrey Harman, MD;  Location: [name] LAB;  Service: Cardiovascular;  Laterality: N/A;   BALLOON DILATION N/A 07/23/2018   Procedure: BALLOON DILATION;  Surgeon: Meridee Score Audrey Starring., MD;  Location: [facility] ENDOSCOPY;  Service: Gastroenterology;  Laterality: N/A;    BILIARY BRUSHING  08/10/2018   Procedure: BILIARY BRUSHING;  Surgeon: Meridee Score Audrey Starring., MD;  Location: [name] ENDOSCOPY;  Service: Gastroenterology;;   BILIARY BRUSHING  11/30/2018   Procedure: BILIARY BRUSHING;  Surgeon: Lemar Lofty., MD;  Location: [facility] ENDOSCOPY;  Service: Gastroenterology;;   BILIARY DILATION  08/10/2018   Procedure: BILIARY DILATION;  Surgeon: Lemar Lofty., MD;  Location: [name] ENDOSCOPY;  Service: Gastroenterology;;   BILIARY DILATION  08/27/2018   Procedure: BILIARY DILATION;  Surgeon: Lemar Lofty., MD;  Location: [facility] ENDOSCOPY;  Service: Gastroenterology;;   BILIARY DILATION  01/24/2020   Procedure: BILIARY DILATION;  Surgeon: Lemar Lofty., MD;  Location: [name] END[facility];  Service: Gastroenterology;;   BILIARY STENT PLACEMENT  08/10/2018   Procedure: BILIARY STENT PLACEMENT;  Surgeon: Lemar Lofty., MD;  Location: [facility] ENDOSCOPY;  Service: Gastroenterology;;   BILIARY STENT PLACEMENT  08/27/2018   Procedure: BILIARY STENT PLACEMENT;  Surgeon: Lemar Lofty., MD;  Location: [name] ENDOSCOPY;  Service: Gastroenterology;;   BILIARY STENT PLACEMENT  11/30/2018   Procedure: BILIARY STENT PLACEMENT;  Surgeon: Lemar Lofty., MD;  Location: [facility] ENDOSCOPY;  Service: Gastroenterology;;   BIOPSY  07/23/2018  Procedure: BIOPSY;  Surgeon: Lemar Lofty., MD;  Location: [facility] ENDOSCOPY;  Service: Gastroenterology;;   BIOPSY  01/24/2020   Procedure: BIOPSY;  Surgeon: Lemar Lofty., MD;  Location: [name] ENDOS[facility];  Service: Gastroenterology;;   CHOLECYSTECTOMY N/A 09/02/2018   Procedure: LAPAROSCOPIC CHOLECYSTECTOMY;  Surgeon: Almond Lint, MD;  Location: [facility] OR;  Service: General;  Laterality: N/A;   COLONOSCOPY     normal    ENDOSCOPIC RETROGRADE CHOLANGIOPANCREATOGRAPHY (ERCP) WITH PROPOFOL N/A 08/10/2018   Procedure: ENDOSCOPIC RETROGRADE CHOLANGIOPANCREATOGRAPHY (ERCP) WITH PROPOFOL;  Surgeon: Lemar Lofty.,  MD;  Location: [facility] ENDOSCOPY;  Service: Gastroenterology;  Laterality: N/A;   ENDOSCOPIC RETROGRADE CHOLANGIOPANCREATOGRAPHY (ERCP) WITH PROPOFOL N/A 11/30/2018   Procedure: ENDOSCOPIC RETROGRADE CHOLANGIOPANCREATOGRAPHY (ERCP) WITH PROPOFOL;  Surgeon: Meridee Score Audrey Starring., MD;  Location: [facility] ENDOSCOPY;  Service: Gastroenterology;  Laterality: N/A;   ENDOSCOPIC RETROGRADE CHOLANGIOPANCREATOGRAPHY (ERCP) WITH PROPOFOL N/A 01/24/2020   Procedure: ENDOSCOPIC RETROGRADE CHOLANGIOPANCREATOGRAPHY (ERCP) WITH PROPOFOL;  Surgeon: Meridee Score Audrey Starring., MD;  Location: [name] ENDOS[facility];  Service: Gastroenterology;  Laterality: N/A;   ERCP N/A 07/23/2018   Procedure: ENDOSCOPIC RETROGRADE CHOLANGIOPANCREATOGRAPHY (ERCP);  Surgeon: Lemar Lofty., MD;  Location: [facility] ENDOSCOPY;  Service: Gastroenterology;  Laterality: N/A;   ERCP N/A 08/27/2018   Procedure: ENDOSCOPIC RETROGRADE CHOLANGIOPANCREATOGRAPHY (ERCP);  Surgeon: Lemar Lofty., MD;  Location: [name] ENDOSCOPY;  Service: Gastroenterology;  Laterality: N/A;   ESOPHAGOGASTRODUODENOSCOPY N/A 01/24/2020   Procedure: ESOPHAGOGASTRODUODENOSCOPY (EGD);  Surgeon: Lemar Lofty., MD;  Location: [name] ENDOSCOPY;  Service: Gastroenterology;  Laterality: N/A;   ESOPHAGOGASTRODUODENOSCOPY (EGD) WITH PROPOFOL N/A 08/27/2018   Procedure: ESOPHAGOGASTRODUODENOSCOPY (EGD) WITH PROPOFOL;  Surgeon: Meridee Score Audrey Starring., MD;  Location: [name] ENDOSCOPY;  Service: Gastroenterology;  Laterality: N/A;   EUS N/A 08/27/2018   Procedure: ESOPHAGEAL ENDOSCOPIC ULTRASOUND (EUS) RADIAL;  Surgeon: Meridee Score Audrey Starring., MD;  Location: [name] ENDOSCOPY;  Service: Gastroenterology;  Laterality: N/A;   FINE NEEDLE ASPIRATION  08/27/2018   Procedure: FINE NEEDLE ASPIRATION (FNA) LINEAR;  Surgeon: Lemar Lofty., MD;  Location: [name] ENDOSCOPY;  Service: Gastroenterology;;   Sherald Hess HERNIA REPAIR N/A 06/23/2020   Procedure: OPEN INCISIONAL HERNIA REPAIR WITH MESH;  Surgeon:  Quentin Ore, MD;  Location: [facility] ORS;  Service: General;  Laterality: N/A;  ROOM 2 STARTING AT 11:00AM FOR 90 MIN   IR IMAGING GUIDED PORT INSERTION  01/08/2018   LAPAROSCOPIC NEPHRECTOMY Left 08/01/2016   Procedure: LAPAROSCOPIC  RADICAL NEPHRECTOMY/ REPAIR OF UMBILICAL HERNIA;  Surgeon: Heloise Purpura, MD;  Location: [facility] ORS;  Service: Urology;  Laterality: Left;   PERIPHERAL VASCULAR INTERVENTION  10/21/2022   Procedure: PERIPHERAL VASCULAR INTERVENTION;  Surgeon: Audrey Harman, MD;  Location: [facility] INVASIVE CV LAB;  Service: Cardiovascular;;   REMOVAL OF STONES  07/23/2018   Procedure: REMOVAL OF GALL STONES;  Surgeon: Meridee Score Audrey Starring., MD;  Location: [facility] ENDOSCOPY;  Service: Gastroenterology;;   REMOVAL OF STONES  08/10/2018   Procedure: REMOVAL OF STONES;  Surgeon: Lemar Lofty., MD;  Location: [name] ENDOSCOPY;  Service: Gastroenterology;;   REMOVAL OF STONES  08/27/2018   Procedure: REMOVAL OF STONES;  Surgeon: Lemar Lofty., MD;  Location: [facility] ENDOSCOPY;  Service: Gastroenterology;;   REMOVAL OF STONES  01/24/2020   Procedure: REMOVAL OF STONES;  Surgeon: Lemar Lofty., MD;  Location: [name] ENDOSCOPY;  Service: Gastroenterology;;   Dennison Mascot  07/23/2018   Procedure: Dennison Mascot;  Surgeon: Lemar Lofty., MD;  Location: [name] ENDOSCOPY;  Service: Gastroenterology;;   Francine Graven REMOVAL  08/27/2018   Procedure: STENT REMOVAL;  Surgeon: Corliss Parish  Montez Hageman., MD;  Location: [facility] ENDOSCOPY;  Service: Gastroenterology;;   Francine Graven REMOVAL  11/30/2018   Procedure: STENT REMOVAL;  Surgeon: Lemar Lofty., MD;  Location: [facility] ENDOSCOPY;  Service: Gastroenterology;;   Francine Graven REMOVAL  01/24/2020   Procedure: STENT REMOVAL;  Surgeon: Lemar Lofty., MD;  Location: [facility] ENDOSCOPY;  Service: Gastroenterology;;   UPPER GI ENDOSCOPY     x 1   VAGINAL PROLAPSE REPAIR  11/18/2019   [facility]    SOCIAL HISTORY: Social History   Socioeconomic History    Marital status: Widowed    Spouse name: Not on file   Number of children: 3   Years of education: Not on file   Highest education level: Not on file  Occupational History   Occupation: retired  Tobacco Use   Smoking status: Former    Current packs/day: 0.00    Average packs/day: 1 pack/day for 8.0 years (8.0 ttl pk-yrs)    Types: Cigarettes    Start date: 06/18/1956    Quit date: 06/18/1964    Years since quitting: 58.5   Smokeless tobacco: Never  Vaping Use   Vaping status: Never Used  Substance and Sexual Activity   Alcohol use: No   Drug use: No   Sexual activity: Not Currently    Birth control/protection: Post-menopausal  Other Topics Concern   Not on file  Social History Narrative   Married.  Three children   Social Determinants of Health   Financial Resource Strain: Low Risk  (05/23/2022)   Overall Financial Resource Strain (CARDIA)    Difficulty of Paying Living Expenses: Not hard at all  Food Insecurity: No Food Insecurity (05/23/2022)   Hunger Vital Sign    Worried About Running Out of Food in the Last Year: Never true    Ran Out of Food in the Last Year: Never true  Transportation Needs: No Transportation Needs (05/23/2022)   PRAPARE - Administrator, Civil Service (Medical): No    Lack of Transportation (Non-Medical): No  Physical Activity: Not on file  Stress: Not on file  Social Connections: Not on file  Intimate Partner Violence: Not At Risk (05/23/2022)   Humiliation, Afraid, Rape, and Kick questionnaire    Fear of Current or Ex-Partner: No    Emotionally Abused: No    Physically Abused: No    Sexually Abused: No    FAMILY HISTORY: Family History  Problem Relation Age of Onset   Heart attack Father 18   Heart disease Brother 88       CABG   Colon cancer Neg Hx    Esophageal cancer Neg Hx    Inflammatory bowel disease Neg Hx    Liver disease Neg Hx    Pancreatic cancer Neg Hx    Rectal cancer Neg Hx    Stomach cancer Neg Hx       ROS  10 Point review of Systems was done is negative except as noted above.   PHYSICAL EXAMINATION Vital signs reviewed Vitals:   12/18/22 0911  BP: (!) 142/61  Pulse: (!) 59  Resp: 16  Temp: (!) 97.5 F (36.4 C)  SpO2: 100%    GENERAL: Well-appearing elderly Caucasian female, alert, in no acute distress and comfortable SKIN: no acute rashes, no significant lesions EYES: conjunctiva are pink and non-injected, sclera anicteric LYMPH:  no palpable lymphadenopathy in the cervical or supraclavicular regions LUNGS: clear to auscultation b/l with normal respiratory effort HEART: regular rate & rhythm PSYCH: alert & oriented  x 3 with fluent speech NEURO: no focal motor/sensory deficits  EXTREMITIES: Trace swelling of the lower extremities, right greater than left.  No erythema.   LABORATORY DATA: .    Latest Ref Rng & Units 12/18/2022    8:49 AM 12/04/2022   11:13 AM 11/20/2022   11:57 AM  CBC  WBC 4.0 - 10.5 K/uL 9.1  9.7  9.3   Hemoglobin 12.0 - 15.0 g/dL 09.8  11.9  14.7   Hematocrit 36.0 - 46.0 % 41.2  37.9  38.5   Platelets 150 - 400 K/uL 239  217  272    .    Latest Ref Rng & Units 12/18/2022    8:49 AM 12/04/2022   11:13 AM 11/20/2022   11:57 AM  CMP  Glucose 70 - 99 mg/dL 829  562  130   BUN 8 - 23 mg/dL 31  32  38   Creatinine 0.44 - 1.00 mg/dL 8.65  7.84  6.96   Sodium 135 - 145 mmol/L 139  138  137   Potassium 3.5 - 5.1 mmol/L 4.1  4.5  4.4   Chloride 98 - 111 mmol/L 115  109  111   CO2 22 - 32 mmol/L 18  24  19    Calcium 8.9 - 10.3 mg/dL 7.9  8.5  9.7   Total Protein 6.5 - 8.1 g/dL 6.4  6.1  6.4   Total Bilirubin 0.3 - 1.2 mg/dL 0.4  0.4  0.5   Alkaline Phos 38 - 126 U/L 49  52  56   AST 15 - 41 U/L 9  13  14    ALT 0 - 44 U/L 9  19  17     RADIOLOGY    CLINICAL DATA:  Subsequent treatment strategy for metastatic renal cell carcinoma.   EXAM: NUCLEAR MEDICINE PET SKULL BASE TO THIGH   TECHNIQUE: 6.32 mCi F-18 FDG was injected intravenously.  Full-ring PET imaging was performed from the skull base to thigh after the radiotracer. CT data was obtained and used for attenuation correction and anatomic localization.   Fasting blood glucose: 159 mg/dl   COMPARISON:  PET-CT 29/52/8413 and 02/09/2020.   FINDINGS: Mediastinal blood pool activity: SUV max 2.7   NECK:   No hypermetabolic cervical lymph nodes are identified.There are no lesions of the pharyngeal mucosal space. Activity associated with the muscles of phonation is within physiologic limits and similar to previous study.   Incidental CT findings: Bilateral carotid atherosclerosis.   CHEST:   There are no hypermetabolic mediastinal, hilar or axillary lymph nodes. No hypermetabolic pulmonary activity or suspicious nodularity.   Incidental CT findings: Right IJ Port-A-Cath extends to the superior cavoatrial junction. There is diffuse atherosclerosis of the aorta, great vessels and coronary arteries. Mild linear scarring at both lung bases appears unchanged.   ABDOMEN/PELVIS:   There is no hypermetabolic activity within the liver, adrenal glands, spleen or pancreas. There is no hypermetabolic nodal activity. Stable appearance of the right kidney. No hypermetabolic activity within the left nephrectomy bed   Incidental CT findings: Diffuse aortic and branch vessel atherosclerosis, scattered colonic diverticulosis and pelvic floor laxity are again noted.   SKELETON:   There is no hypermetabolic activity to suggest osseous metastatic disease.   Incidental CT findings: Grossly stable sclerotic lesions within the thoracic spine, without hypermetabolic activity. Lower lumbar spondylosis and sacroiliac degenerative changes bilaterally.   IMPRESSION: 1. Stable PET-CT status post left nephrectomy. No evidence of local recurrence or hypermetabolic metastatic disease. 2.  Stable incidental findings including coronary and Aortic Atherosclerosis (ICD10-I70.0).  Pelvic floor laxity.     Electronically Signed   By: Carey Bullocks M.D.   On: 04/03/2021 12:30      ASSESSMENT and PLAN:   ALASIAH CAMPODONICO is a 82 y.o. female who returns for a follow up for metastatic renal cell carcinoma.   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 -Rt adrenal gland bx - showed clear cell RCC -S/p Cytoreductive left radical nephrectomy and left adrenal gland resection on 08/01/2016 by Dr Laverle Patter. -Started systemic therapy with Nivolumab 240 mg on 01/02/2018 and Xgeva on 02/27/2018.  -Most recent PET scan from 07/03/2022 revealed no progression or metastatic disease.   # Hypothyroidism/Adrenal insufficiency/Diabetes -Continue being followed by Dr. Sharl Ma   #H/o Choledocholithiasis and cholelithiasis -07/10/18 US Abdomen revealed Biliary duct dilatation with 8 mm calculus at the level of the ampulla in the distal common bile duct. 2. Cholelithiasis. No gallbladder wall thickening or pericholecystic fluid. 3. Appearance of the liver raises concern for underlying hepatic cirrhosis. No focal liver lesions are demonstrable. 4.  Left kidney absent. 5.  Small cysts in right kidney. 6.  Aortic Atherosclerosis. -S/p ERCP on 07/23/18 with Dr. Vicente Serene Mansouraty -09/02/18 Cytopathology from ERCP was suspicious for malignant cells in bile duct, but was not definitive, other two findings were benign.  #Right foot wound 2/2 PAD and DM: --Under the care of  wound care from [facility] in [place]. --Recently admitted from 10/21/2022-10/22/2022 due to chronic right lower extremity limb threatening ischemia with wounds. Patient underwent balloon angioplasty with stent of right popliteal and SFA.   #Hidradenitis suppurativa: --Patient reports having boils in her groin and inner thigh --Sent topical antibiotic and referral to dermatology for further evaluation  PLAN:   -Due for next dose of Nivolumab  treatment. -Reviewed labs from today that require no intervention. WBC 9.1, Hgb 13.0, MCV 91.6, Plt 239. Stable creatinine at 1.56. LFTs adequate -Proceed with Nivolumab treatment today without dose modifications -Scheduled for repeat PET scan on 01/06/2023.  -RTC in 2 weeks for labs and next treatment of Nivolumab.and 4 weeks for labs/follow up visit.   FOLLOW-UP: Per integrated scheduling  All of the patient's questions were answered with apparent satisfaction. The patient knows to call the clinic with any problems, questions or concerns.  I have spent a total of 30 minutes minutes of face-to-face and non-face-to-face time, preparing to see the patient,performing a medically appropriate examination, counseling and educating the patient,referring and communicating with other health care professionals, documenting clinical information in the electronic health record, and care coordination.   Georga Kaufmann PA-C Dept of Hematology and Oncology [name] Phone: [phone]

## 2022-12-19 ENCOUNTER — Encounter: Payer: Self-pay | Admitting: Hematology

## 2022-12-19 LAB — T4: T4, Total: 8.7 ug/dL (ref 4.5–12.0)

## 2022-12-30 ENCOUNTER — Other Ambulatory Visit: Payer: Self-pay

## 2023-01-01 ENCOUNTER — Inpatient Hospital Stay: Payer: Medicare Other | Attending: Hematology

## 2023-01-01 ENCOUNTER — Inpatient Hospital Stay (HOSPITAL_BASED_OUTPATIENT_CLINIC_OR_DEPARTMENT_OTHER): Payer: Medicare Other | Admitting: Hematology

## 2023-01-01 ENCOUNTER — Other Ambulatory Visit: Payer: Medicare Other

## 2023-01-01 ENCOUNTER — Ambulatory Visit: Payer: Medicare Other

## 2023-01-01 ENCOUNTER — Encounter: Payer: Self-pay | Admitting: Hematology

## 2023-01-01 ENCOUNTER — Inpatient Hospital Stay: Payer: Medicare Other

## 2023-01-01 ENCOUNTER — Ambulatory Visit: Payer: Medicare Other | Admitting: Hematology and Oncology

## 2023-01-01 VITALS — BP 129/62 | HR 70 | Resp 17

## 2023-01-01 DIAGNOSIS — C649 Malignant neoplasm of unspecified kidney, except renal pelvis: Secondary | ICD-10-CM | POA: Diagnosis not present

## 2023-01-01 DIAGNOSIS — I2510 Atherosclerotic heart disease of native coronary artery without angina pectoris: Secondary | ICD-10-CM | POA: Diagnosis not present

## 2023-01-01 DIAGNOSIS — E034 Atrophy of thyroid (acquired): Secondary | ICD-10-CM | POA: Insufficient documentation

## 2023-01-01 DIAGNOSIS — Z95828 Presence of other vascular implants and grafts: Secondary | ICD-10-CM

## 2023-01-01 DIAGNOSIS — C7951 Secondary malignant neoplasm of bone: Secondary | ICD-10-CM

## 2023-01-01 DIAGNOSIS — E1151 Type 2 diabetes mellitus with diabetic peripheral angiopathy without gangrene: Secondary | ICD-10-CM | POA: Diagnosis not present

## 2023-01-01 DIAGNOSIS — K219 Gastro-esophageal reflux disease without esophagitis: Secondary | ICD-10-CM | POA: Insufficient documentation

## 2023-01-01 DIAGNOSIS — E785 Hyperlipidemia, unspecified: Secondary | ICD-10-CM | POA: Insufficient documentation

## 2023-01-01 DIAGNOSIS — E1136 Type 2 diabetes mellitus with diabetic cataract: Secondary | ICD-10-CM | POA: Insufficient documentation

## 2023-01-01 DIAGNOSIS — L732 Hidradenitis suppurativa: Secondary | ICD-10-CM | POA: Insufficient documentation

## 2023-01-01 DIAGNOSIS — Z5112 Encounter for antineoplastic immunotherapy: Secondary | ICD-10-CM | POA: Insufficient documentation

## 2023-01-01 DIAGNOSIS — Z9049 Acquired absence of other specified parts of digestive tract: Secondary | ICD-10-CM | POA: Insufficient documentation

## 2023-01-01 DIAGNOSIS — Z800 Family history of malignant neoplasm of digestive organs: Secondary | ICD-10-CM | POA: Insufficient documentation

## 2023-01-01 DIAGNOSIS — I700 Atherosclerosis of aorta: Secondary | ICD-10-CM | POA: Diagnosis not present

## 2023-01-01 DIAGNOSIS — I252 Old myocardial infarction: Secondary | ICD-10-CM | POA: Diagnosis not present

## 2023-01-01 DIAGNOSIS — M7989 Other specified soft tissue disorders: Secondary | ICD-10-CM | POA: Diagnosis not present

## 2023-01-01 DIAGNOSIS — I129 Hypertensive chronic kidney disease with stage 1 through stage 4 chronic kidney disease, or unspecified chronic kidney disease: Secondary | ICD-10-CM | POA: Insufficient documentation

## 2023-01-01 DIAGNOSIS — Z8719 Personal history of other diseases of the digestive system: Secondary | ICD-10-CM | POA: Insufficient documentation

## 2023-01-01 DIAGNOSIS — C642 Malignant neoplasm of left kidney, except renal pelvis: Secondary | ICD-10-CM

## 2023-01-01 DIAGNOSIS — Z87891 Personal history of nicotine dependence: Secondary | ICD-10-CM | POA: Insufficient documentation

## 2023-01-01 DIAGNOSIS — E1122 Type 2 diabetes mellitus with diabetic chronic kidney disease: Secondary | ICD-10-CM | POA: Insufficient documentation

## 2023-01-01 LAB — CBC WITH DIFFERENTIAL (CANCER CENTER ONLY)
Abs Immature Granulocytes: 0.11 10*3/uL — ABNORMAL HIGH (ref 0.00–0.07)
Basophils Absolute: 0.1 10*3/uL (ref 0.0–0.1)
Basophils Relative: 1 %
Eosinophils Absolute: 0.1 10*3/uL (ref 0.0–0.5)
Eosinophils Relative: 1 %
HCT: 42.2 % (ref 36.0–46.0)
Hemoglobin: 13.4 g/dL (ref 12.0–15.0)
Immature Granulocytes: 1 %
Lymphocytes Relative: 11 %
Lymphs Abs: 1.1 10*3/uL (ref 0.7–4.0)
MCH: 28.2 pg (ref 26.0–34.0)
MCHC: 31.8 g/dL (ref 30.0–36.0)
MCV: 88.8 fL (ref 80.0–100.0)
Monocytes Absolute: 1.1 10*3/uL — ABNORMAL HIGH (ref 0.1–1.0)
Monocytes Relative: 11 %
Neutro Abs: 8 10*3/uL — ABNORMAL HIGH (ref 1.7–7.7)
Neutrophils Relative %: 75 %
Platelet Count: 298 10*3/uL (ref 150–400)
RBC: 4.75 MIL/uL (ref 3.87–5.11)
RDW: 14.4 % (ref 11.5–15.5)
WBC Count: 10.5 10*3/uL (ref 4.0–10.5)
nRBC: 0 % (ref 0.0–0.2)

## 2023-01-01 LAB — CMP (CANCER CENTER ONLY)
ALT: 10 U/L (ref 0–44)
AST: 12 U/L — ABNORMAL LOW (ref 15–41)
Albumin: 3.9 g/dL (ref 3.5–5.0)
Alkaline Phosphatase: 50 U/L (ref 38–126)
Anion gap: 7 (ref 5–15)
BUN: 23 mg/dL (ref 8–23)
CO2: 19 mmol/L — ABNORMAL LOW (ref 22–32)
Calcium: 9 mg/dL (ref 8.9–10.3)
Chloride: 113 mmol/L — ABNORMAL HIGH (ref 98–111)
Creatinine: 1.66 mg/dL — ABNORMAL HIGH (ref 0.44–1.00)
GFR, Estimated: 31 mL/min — ABNORMAL LOW (ref >60)
Glucose, Bld: 115 mg/dL — ABNORMAL HIGH (ref 70–99)
Potassium: 4.1 mmol/L (ref 3.5–5.1)
Sodium: 139 mmol/L (ref 135–145)
Total Bilirubin: 0.6 mg/dL (ref 0.3–1.2)
Total Protein: 6.7 g/dL (ref 6.5–8.1)

## 2023-01-01 LAB — TSH: TSH: 2.046 u[IU]/mL (ref 0.350–4.500)

## 2023-01-01 MED ORDER — SODIUM CHLORIDE 0.9% FLUSH
10.0000 mL | INTRAVENOUS | Status: DC | PRN
  Administered 2023-01-01: 10 mL

## 2023-01-01 MED ORDER — SODIUM CHLORIDE 0.9 % IV SOLN: Freq: Once | INTRAVENOUS | Status: AC

## 2023-01-01 MED ORDER — SODIUM CHLORIDE 0.9% FLUSH
10.0000 mL | Freq: Once | INTRAVENOUS | Status: AC
  Administered 2023-01-01: 10 mL

## 2023-01-01 MED ORDER — PREMARIN 0.625 MG/GM VA CREA: 1.0000 | TOPICAL_CREAM | Freq: Every day | VAGINAL | 1 refills | Status: DC

## 2023-01-01 MED ORDER — HEPARIN SOD (PORK) LOCK FLUSH 100 UNIT/ML IV SOLN
500.0000 [IU] | Freq: Once | INTRAVENOUS | Status: AC | PRN
  Administered 2023-01-01: 500 [IU]

## 2023-01-01 MED ORDER — SODIUM CHLORIDE 0.9 % IV SOLN
240.0000 mg | Freq: Once | INTRAVENOUS | Status: AC
  Administered 2023-01-01: 240 mg via INTRAVENOUS
  Filled 2023-01-01: qty 24

## 2023-01-01 NOTE — Progress Notes (Signed)
Pt states she is still having jaw pain and is going to follow up with the dentist, will continue to hold xgeva.

## 2023-01-01 NOTE — Progress Notes (Signed)
Patient seen by Dr. Addison Naegeli are within treatment parameters./ Dr Candise Che aware BP 134/56  Labs reviewed: and are not all within treatment parameters. Dr Candise Che aware CR: 1.66,   Per physician team, patient is ready for treatment and there are NO modifications to the treatment plan.

## 2023-01-01 NOTE — Progress Notes (Signed)
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  SUMMARY OF ONCOLOGIC HISTORY: Oncology History  Cancer, 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HCC)  01/10/2017 Initial Diagnosis   Cancer, 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HCC)   01/02/2018 - 01/16/2022 Chemotherapy   Patient is on Treatment Plan : RENAL CELL CARCINOMA NIVOLUMAB Q14D     01/22/2022 Cancer Staging   Staging form: Bone - Appendicular Skeleton, Trunk, Skull, and Facial Bones, AJCC 8th Edition - Clinical: Stage IVB (pM1b) - Signed by Johney Maine, MD on 01/22/2022   02/01/2022 -  Chemotherapy   Patient is on Treatment Plan : RENAL CELL CARCINOMA Nivolumab (240) q14d/     Renal cell carcinoma, left (HCC)  04/11/2017 Initial Diagnosis   Renal cell carcinoma (HCC)   01/02/2018 - 01/16/2022 Chemotherapy   The patient had nivolumab (OPDIVO) 240 mg in sodium chloride 0.9 % 100 mL chemo infusion, 240 mg, Intravenous, Once, 47 of 50 cycles Administration: 240 mg (01/02/2018), 240 mg (01/15/2018), 240 mg (01/30/2018), 240 mg (02/13/2018), 240 mg (02/27/2018), 240 mg (03/13/2018), 240 mg (03/27/2018), 240 mg (04/10/2018), 240 mg (04/24/2018), 240 mg (05/08/2018), 240 mg (05/22/2018), 240 mg (06/05/2018), 240 mg (06/19/2018), 240 mg (08/28/2018), 240 mg (09/11/2018), 240 mg (10/09/2018), 240 mg (10/22/2018), 240 mg (11/06/2018), 240 mg (11/20/2018), 240 mg (12/04/2018), 240 mg (12/18/2018), 240 mg (01/01/2019), 240 mg (01/15/2019), 240 mg (01/29/2019), 240 mg (02/12/2019), 240 mg (02/26/2019), 240 mg (03/12/2019), 240 mg (03/26/2019), 240 mg (04/09/2019), 240 mg (04/28/2019), 240 mg (05/12/2019), 240 mg (05/27/2019), 240 mg (06/09/2019), 240 mg (06/23/2019), 240 mg (07/07/2019), 240 mg (07/21/2019), 240 mg (08/04/2019), 240 mg (08/18/2019), 240 mg (09/03/2019), 240 mg (09/15/2019), 240 mg (09/29/2019), 240 mg (10/12/2019), 240 mg (10/27/2019), 240 mg  (11/10/2019), 240 mg (12/08/2019), 240 mg (12/22/2019), 240 mg (01/19/2020)  for chemotherapy treatment.    02/01/2022 -  Chemotherapy   Patient is on Treatment Plan : RENAL CELL CARCINOMA Nivolumab (240) q14d/       CURRENT THERAPY: metastatic renal cell carcinoma, Rivka Barbara and Nivolumab  INTERVAL HISTORY:  Audrey [name] 82 y.o. female is here for continued evaluation and management of metastatic renal cell carcinoma. She is here to continue her Xgeva and Nivolumab.  Patient was seen by me on 08/28/2022 and reported mild abdominal pain, but was otherwise doing well overall with no new medical concerns.   She was most recently seen by Thayil PA on 12/18/2022 and reported boils in her left groin and right inner thigh. She noted being diagnosed with hidradenitis suppurativa without treatment. Patient also reported continued dropping of her eyelids after having cataract surgery.   Today, she is here for toxicity check for cycle 24 day 1 of treatment. Patient reports bilateral foot swelling. She continues to have open wounds in her feet which have improved. Her ingrown nail on her big toe continues to be bothersome.   She complains of severe pain with urination despite testing negative for UTI.   She reports that she has hidradenitis suppurativa and repots having a boil in her left groin and two others that are beginning to develop. She uses Mupirocin cream to manage symptoms.  She denies any fhx of breast cancer. Her mother did have a history of colon cancer late in her lifetime and passed away at age 80. She reports that her father endorsed respiratory issues and was a chronic smoker before  passing away at age 61. Patient's brother did have heart issues.   She reports that in July, she endorsed bruising in her abdomen and lower extremities from her arteries being accessed during a procedure. She complains of abdominal pain.  Patient Active Problem List   Diagnosis Date Noted   PAD (peripheral artery  disease) (HCC) 10/21/2022   Atrophy of thyroid (acquired) 01/15/2022   At risk for falls 01/15/2022   CKD (chronic kidney disease) 01/15/2022   Diabetic ulcer of foot with fat layer exposed (HCC) 01/15/2022   Dizziness 01/15/2022   Dysuria 01/15/2022   Knee osteoarthritis 01/15/2022   Pulmonary nodule 01/15/2022   Type 2 diabetes mellitus (HCC) 08/15/2021   Incisional hernia 06/23/2020   Gastritis and gastroduodenitis 02/26/2020   Rectal discomfort 02/26/2020   History of colonic polyps 02/26/2020   Ventral hernia 11/10/2019   History of cholecystectomy 07/17/2019   Degeneration of lumbar intervertebral disc 11/12/2018   Anemia 10/10/2018   Chronic cholecystitis 09/04/2018   Acute cholecystitis 08/29/2018   Biliary stricture 08/23/2018   Medicare annual wellness visit, subsequent 08/14/2018   CAD (coronary artery disease) 08/14/2018   Dyslipidemia 08/14/2018   Abnormal LFTs 08/05/2018   Choledocholithiasis 08/05/2018   Dilated bile duct 08/05/2018   History of ERCP 08/05/2018   History of renal cell carcinoma 08/05/2018   Port-A-Cath in place 01/30/2018   Counseling regarding advance care planning and goals of care 01/04/2018   Renal cell carcinoma, left (HCC) 04/11/2017   Cancer, 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HCC) 01/10/2017   Left renal mass 08/01/2016   Hyperlipidemia 05/23/2015   Essential hypertension 05/23/2015   Hypothyroidism 05/23/2015    is allergic to doxycycline, adhesive [tape], duloxetine, gabapentin (once-daily), nsaids, and statins.  MEDICAL HISTORY: Past Medical History:  Diagnosis Date   Anemia    Arthritis    lower back, hips, hands   Biliary stricture    Diabetes mellitus (HCC)    type 2    Early cataracts, bilateral    Md just watching   Elevated liver enzymes    Family history of adverse reaction to anesthesia    Daughter hard to wake up   Gallstones    GERD (gastroesophageal reflux disease)    occasional - diet controlled   History of blood  transfusion 2018   History of hiatal hernia    HTN (hypertension)    Hyperlipidemia    Hypothyroidism    left renal ca dx'd 2018   renal cancer - left kidney removed, pill chemo x 1 yr   Myocardial infarction (HCC) 1991   no deficits   SVD (spontaneous vaginal delivery)    x 3   Wears glasses     SURGICAL HISTORY: Past Surgical History:  Procedure Laterality Date   ABDOMINAL AORTOGRAM W/LOWER EXTREMITY N/A 10/21/2022   Procedure: ABDOMINAL AORTOGRAM W/LOWER EXTREMITY;  Surgeon: Maeola Harman, MD;  Location: [facility] INVASIVE CV LAB;  Service: Cardiovascular;  Laterality: N/A;   BALLOON DILATION N/A 07/23/2018   Procedure: BALLOON DILATION;  Surgeon: Meridee Score Netty Starring., MD;  Location: [facility] ENDOSCOPY;  Service: Gastroenterology;  Laterality: N/A;   BILIARY BRUSHING  08/10/2018   Procedure: BILIARY BRUSHING;  Surgeon: Meridee Score Netty Starring., MD;  Location: [facility] ENDOSCOPY;  Service: Gastroenterology;;   BILIARY BRUSHING  11/30/2018   Procedure: BILIARY BRUSHING;  Surgeon: Lemar Lofty., MD;  Location: [facility] ENDOSCOPY;  Service: Gastroenterology;;   BILIARY DILATION  08/10/2018   Procedure: BILIARY DILATION;  Surgeon: Lemar Lofty., MD;  Location: [facility] ENDOSCOPY;  Service: Gastroenterology;;   BILIARY DILATION  08/27/2018   Procedure: BILIARY DILATION;  Surgeon: Meridee Score Netty Starring., MD;  Location: [facility] ENDOSCOPY;  Service: Gastroenterology;;   BILIARY DILATION  01/24/2020   Procedure: BILIARY DILATION;  Surgeon: Lemar Lofty., MD;  Location: [facility] ENDOSCOPY;  Service: Gastroenterology;;   BILIARY STENT PLACEMENT  08/10/2018   Procedure: BILIARY STENT PLACEMENT;  Surgeon: Lemar Lofty., MD;  Location: [facility] ENDOSCOPY;  Service: Gastroenterology;;   BILIARY STENT PLACEMENT  08/27/2018   Procedure: BILIARY STENT PLACEMENT;  Surgeon: Lemar Lofty., MD;  Location: [facility] ENDOSCOPY;  Service: Gastroenterology;;   BILIARY STENT PLACEMENT  11/30/2018   Procedure:  BILIARY STENT PLACEMENT;  Surgeon: Lemar Lofty., MD;  Location: [facility] ENDOSCOPY;  Service: Gastroenterology;;   BIOPSY  07/23/2018   Procedure: BIOPSY;  Surgeon: Lemar Lofty., MD;  Location: [facility] ENDOSCOPY;  Service: Gastroenterology;;   BIOPSY  01/24/2020   Procedure: BIOPSY;  Surgeon: Lemar Lofty., MD;  Location: [facility] ENDOSCOPY;  Service: Gastroenterology;;   CHOLECYSTECTOMY N/A 09/02/2018   Procedure: LAPAROSCOPIC CHOLECYSTECTOMY;  Surgeon: Almond Lint, MD;  Location: [facility] OR;  Service: General;  Laterality: N/A;   COLONOSCOPY     normal    ENDOSCOPIC RETROGRADE CHOLANGIOPANCREATOGRAPHY (ERCP) WITH PROPOFOL N/A 08/10/2018   Procedure: ENDOSCOPIC RETROGRADE CHOLANGIOPANCREATOGRAPHY (ERCP) WITH PROPOFOL;  Surgeon: Lemar Lofty., MD;  Location: [facility] ENDOSCOPY;  Service: Gastroenterology;  Laterality: N/A;   ENDOSCOPIC RETROGRADE CHOLANGIOPANCREATOGRAPHY (ERCP) WITH PROPOFOL N/A 11/30/2018   Procedure: ENDOSCOPIC RETROGRADE CHOLANGIOPANCREATOGRAPHY (ERCP) WITH PROPOFOL;  Surgeon: Meridee Score Netty Starring., MD;  Location: [facility] ENDOSCOPY;  Service: Gastroenterology;  Laterality: N/A;   ENDOSCOPIC RETROGRADE CHOLANGIOPANCREATOGRAPHY (ERCP) WITH PROPOFOL N/A 01/24/2020   Procedure: ENDOSCOPIC RETROGRADE CHOLANGIOPANCREATOGRAPHY (ERCP) WITH PROPOFOL;  Surgeon: Meridee Score Netty Starring., MD;  Location: [facility] ENDOSCOPY;  Service: Gastroenterology;  Laterality: N/A;   ERCP N/A 07/23/2018   Procedure: ENDOSCOPIC RETROGRADE CHOLANGIOPANCREATOGRAPHY (ERCP);  Surgeon: Lemar Lofty., MD;  Location: [facility] ENDOSCOPY;  Service: Gastroenterology;  Laterality: N/A;   ERCP N/A 08/27/2018   Procedure: ENDOSCOPIC RETROGRADE CHOLANGIOPANCREATOGRAPHY (ERCP);  Surgeon: Lemar Lofty., MD;  Location: [facility] ENDOSCOPY;  Service: Gastroenterology;  Laterality: N/A;   ESOPHAGOGASTRODUODENOSCOPY N/A 01/24/2020   Procedure: ESOPHAGOGASTRODUODENOSCOPY (EGD);  Surgeon: Lemar Lofty., MD;   Location: [facility] ENDOSCOPY;  Service: Gastroenterology;  Laterality: N/A;   ESOPHAGOGASTRODUODENOSCOPY (EGD) WITH PROPOFOL N/A 08/27/2018   Procedure: ESOPHAGOGASTRODUODENOSCOPY (EGD) WITH PROPOFOL;  Surgeon: Meridee Score Netty Starring., MD;  Location: [facility] ENDOSCOPY;  Service: Gastroenterology;  Laterality: N/A;   EUS N/A 08/27/2018   Procedure: ESOPHAGEAL ENDOSCOPIC ULTRASOUND (EUS) RADIAL;  Surgeon: Meridee Score Netty Starring., MD;  Location: [facility] ENDOSCOPY;  Service: Gastroenterology;  Laterality: N/A;   FINE NEEDLE ASPIRATION  08/27/2018   Procedure: FINE NEEDLE ASPIRATION (FNA) LINEAR;  Surgeon: Lemar Lofty., MD;  Location: [facility] ENDOSCOPY;  Service: Gastroenterology;;   Sherald Hess HERNIA REPAIR N/A 06/23/2020   Procedure: OPEN INCISIONAL HERNIA REPAIR WITH MESH;  Surgeon: Quentin Ore, MD;  Location: [facility] ORS;  Service: General;  Laterality: N/A;  ROOM 2 STARTING AT 11:00AM FOR 90 MIN   IR IMAGING GUIDED PORT INSERTION  01/08/2018   LAPAROSCOPIC NEPHRECTOMY Left 08/01/2016   Procedure: LAPAROSCOPIC  RADICAL NEPHRECTOMY/ REPAIR OF UMBILICAL HERNIA;  Surgeon: Heloise Purpura, MD;  Location: [facility] ORS;  Service: Urology;  Laterality: Left;   PERIPHERAL VASCULAR INTERVENTION  10/21/2022   Procedure: PERIPHERAL VASCULAR INTERVENTION;  Surgeon: Maeola Harman, MD;  Location: [facility] INVASIVE CV LAB;  Service: Cardiovascular;;   REMOVAL OF STONES  07/23/2018  Procedure: REMOVAL OF GALL STONES;  Surgeon: Mansouraty, Netty Starring., MD;  Location: [facility] ENDOSCOPY;  Service: Gastroenterology;;   REMOVAL OF STONES  08/10/2018   Procedure: REMOVAL OF STONES;  Surgeon: Lemar Lofty., MD;  Location: [facility] ENDOSCOPY;  Service: Gastroenterology;;   REMOVAL OF STONES  08/27/2018   Procedure: REMOVAL OF STONES;  Surgeon: Lemar Lofty., MD;  Location: [facility] ENDOSCOPY;  Service: Gastroenterology;;   REMOVAL OF STONES  01/24/2020   Procedure: REMOVAL OF STONES;  Surgeon: Lemar Lofty., MD;  Location: [facility]  ENDOSCOPY;  Service: Gastroenterology;;   Dennison Mascot  07/23/2018   Procedure: Dennison Mascot;  Surgeon: Lemar Lofty., MD;  Location: [facility] ENDOSCOPY;  Service: Gastroenterology;;   Francine Graven REMOVAL  08/27/2018   Procedure: STENT REMOVAL;  Surgeon: Lemar Lofty., MD;  Location: [facility] ENDOSCOPY;  Service: Gastroenterology;;   Francine Graven REMOVAL  11/30/2018   Procedure: STENT REMOVAL;  Surgeon: Lemar Lofty., MD;  Location: [facility] ENDOSCOPY;  Service: Gastroenterology;;   Francine Graven REMOVAL  01/24/2020   Procedure: STENT REMOVAL;  Surgeon: Lemar Lofty., MD;  Location: [facility] ENDOSCOPY;  Service: Gastroenterology;;   UPPER GI ENDOSCOPY     x 1   VAGINAL PROLAPSE REPAIR  11/18/2019   [facility]    SOCIAL HISTORY: Social History   Socioeconomic History   Marital status: Widowed    Spouse name: Not on file   Number of children: 3   Years of education: Not on file   Highest education level: Not on file  Occupational History   Occupation: retired  Tobacco Use   Smoking status: Former    Current packs/day: 0.00    Average packs/day: 1 pack/day for 8.0 years (8.0 ttl pk-yrs)    Types: Cigarettes    Start date: 06/18/1956    Quit date: 06/18/1964    Years since quitting: 58.5   Smokeless tobacco: Never  Vaping Use   Vaping status: Never Used  Substance and Sexual Activity   Alcohol use: No   Drug use: No   Sexual activity: Not Currently    Birth control/protection: Post-menopausal  Other Topics Concern   Not on file  Social History Narrative   Married.  Three children   Social Determinants of Health   Financial Resource Strain: Low Risk  (05/23/2022)   Overall Financial Resource Strain (CARDIA)    Difficulty of Paying Living Expenses: Not hard at all  Food Insecurity: No Food Insecurity (05/23/2022)   Hunger Vital Sign    Worried About Running Out of Food in the Last Year: Never true    Ran Out of Food in the Last Year: Never true  Transportation Needs: No  Transportation Needs (05/23/2022)   PRAPARE - Administrator, Civil Service (Medical): No    Lack of Transportation (Non-Medical): No  Physical Activity: Not on file  Stress: Not on file  Social Connections: Not on file  Intimate Partner Violence: Not At Risk (05/23/2022)   Humiliation, Afraid, Rape, and Kick questionnaire    Fear of Current or Ex-Partner: No    Emotionally Abused: No    Physically Abused: No    Sexually Abused: No    FAMILY HISTORY: Family History  Problem Relation Age of Onset   Heart attack Father 10   Heart disease Brother 63       CABG   Colon cancer Neg Hx    Esophageal cancer Neg Hx    Inflammatory bowel disease Neg Hx    Liver disease Neg Hx  Pancreatic cancer Neg Hx    Rectal cancer Neg Hx    Stomach cancer Neg Hx      ROS  10 Point review of Systems was done is negative except as noted above.   PHYSICAL EXAMINATION .BP (!) 134/56 (BP Location: Right Arm, Patient Position: Sitting) Comment: Notified Nurse  Pulse 63   Temp 97.9 F (36.6 C) (Oral)   Resp 16   Wt 122 lb 9.6 oz (55.6 kg)   LMP  (LMP Unknown)   SpO2 100%   BMI 25.62 kg/m  GENERAL:alert, in no acute distress and comfortable SKIN: no acute rashes, no significant lesions EYES: conjunctiva are pink and non-injected, sclera anicteric OROPHARYNX: MMM, no exudates, no oropharyngeal erythema or ulceration NECK: supple, no JVD LYMPH:  no palpable lymphadenopathy in the cervical, axillary or inguinal regions LUNGS: clear to auscultation b/l with normal respiratory effort HEART: regular rate & rhythm ABDOMEN:  normoactive bowel sounds , non tender, not distended. Extremity: no pedal edema PSYCH: alert & oriented x 3 with fluent speech NEURO: no focal motor/sensory deficits    LABORATORY DATA: .    Latest Ref Rng & Units 01/01/2023   10:17 AM 12/18/2022    8:49 AM 12/04/2022   11:13 AM  CBC  WBC 4.0 - 10.5 K/uL 10.5  9.1  9.7   Hemoglobin 12.0 - 15.0 g/dL 14.7  82.9   56.2   Hematocrit 36.0 - 46.0 % 42.2  41.2  37.9   Platelets 150 - 400 K/uL 298  239  217    .    Latest Ref Rng & Units 01/01/2023   10:17 AM 12/18/2022    8:49 AM 12/04/2022   11:13 AM  CMP  Glucose 70 - 99 mg/dL 130  865  784   BUN 8 - 23 mg/dL 23  31  32   Creatinine 0.44 - 1.00 mg/dL 6.96  2.95  2.84   Sodium 135 - 145 mmol/L 139  139  138   Potassium 3.5 - 5.1 mmol/L 4.1  4.1  4.5   Chloride 98 - 111 mmol/L 113  115  109   CO2 22 - 32 mmol/L 19  18  24    Calcium 8.9 - 10.3 mg/dL 9.0  7.9  8.5   Total Protein 6.5 - 8.1 g/dL 6.7  6.4  6.1   Total Bilirubin 0.3 - 1.2 mg/dL 0.6  0.4  0.4   Alkaline Phos 38 - 126 U/L 50  49  52   AST 15 - 41 U/L 12  9  13    ALT 0 - 44 U/L 10  9  19     RADIOLOGY    CLINICAL DATA:  Subsequent treatment strategy for metastatic renal cell carcinoma.   EXAM: NUCLEAR MEDICINE PET SKULL BASE TO THIGH   TECHNIQUE: 6.32 mCi F-18 FDG was injected intravenously. Full-ring PET imaging was performed from the skull base to thigh after the radiotracer. CT data was obtained and used for attenuation correction and anatomic localization.   Fasting blood glucose: 159 mg/dl   COMPARISON:  PET-CT 13/24/4010 and 02/09/2020.   FINDINGS: Mediastinal blood pool activity: SUV max 2.7   NECK:   No hypermetabolic cervical lymph nodes are identified.There are no lesions of the pharyngeal mucosal space. Activity associated with the muscles of phonation is within physiologic limits and similar to previous study.   Incidental CT findings: Bilateral carotid atherosclerosis.   CHEST:   There are no hypermetabolic mediastinal, hilar or axillary lymph nodes.  No hypermetabolic pulmonary activity or suspicious nodularity.   Incidental CT findings: Right IJ Port-A-Cath extends to the superior cavoatrial junction. There is diffuse atherosclerosis of the aorta, great vessels and coronary arteries. Mild linear scarring at both lung bases appears unchanged.    ABDOMEN/PELVIS:   There is no hypermetabolic activity within the liver, adrenal glands, spleen or pancreas. There is no hypermetabolic nodal activity. Stable appearance of the right kidney. No hypermetabolic activity within the left nephrectomy bed   Incidental CT findings: Diffuse aortic and branch vessel atherosclerosis, scattered colonic diverticulosis and pelvic floor laxity are again noted.   SKELETON:   There is no hypermetabolic activity to suggest osseous metastatic disease.   Incidental CT findings: Grossly stable sclerotic lesions within the thoracic spine, without hypermetabolic activity. Lower lumbar spondylosis and sacroiliac degenerative changes bilaterally.   IMPRESSION: 1. Stable PET-CT status post left nephrectomy. No evidence of local recurrence or hypermetabolic metastatic disease. 2. Stable incidental findings including coronary and Aortic Atherosclerosis (ICD10-I70.0). Pelvic floor laxity.     Electronically Signed   By: Carey Bullocks M.D.   On: 04/03/2021 12:30      ASSESSMENT and PLAN:   Audrey [name] is a 82 y.o. female who returns for a follow up for metastatic renal cell carcinoma.   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 -Rt adrenal gland bx - showed clear cell RCC -S/p Cytoreductive left radical nephrectomy and left adrenal gland resection on 08/01/2016 by Dr Laverle Patter. -Started systemic therapy with Nivolumab 240 mg on 01/02/2018 and Xgeva on 02/27/2018.  -Most recent PET scan from 11/09/2021 showed no clear evidence of new or progressive disease.   # Hypothyroidism/Adrenal insufficiency/Diabetes -Continue being followed by Dr. Sharl Ma   #H/o Choledocholithiasis and cholelithiasis -07/10/18 US Abdomen revealed Biliary duct dilatation with 8 mm calculus at the level of the ampulla in the distal common bile duct. 2. Cholelithiasis. No gallbladder  wall thickening or pericholecystic fluid. 3. Appearance of the liver raises concern for underlying hepatic cirrhosis. No focal liver lesions are demonstrable. 4.  Left kidney absent. 5.  Small cysts in right kidney. 6.  Aortic Atherosclerosis. -S/p ERCP on 07/23/18 with Dr. Vicente Serene Mansouraty -09/02/18 Cytopathology from ERCP was suspicious for malignant cells in bile duct, but was not definitive, other two findings were benign.  #Right foot wound 2/2 PAD and DM: --Last evaluated by wound care from [facility] in [place]. --Recent completed course of cephalexin therapy approximately one week ago --Recommend follow up with wound care. Sent another round of abx therapy with Augmentin as patient is unable to tolerate doxycycline and has D2D interaction with bactrim. Trying to avoid clindamycin due to age and risk of C.diff.   PLAN:    -patient is 5 years out since when she first started treatment for her renal cell carcinoma -Discussed lab results on 01/01/23 in detail with patient. CBC normal, showed WBC of 10.5K, hemoglobin of 13.4, and platelets of 298K. -Recent thyroid function normal -patient is scheduled for her next PET scan on 01/06/2023 -if PET scan is negative, it would be reasonable to stop immunotherapy at this time -Patient is hesitant to stop immunotherapy at this time -will further discuss decision to potentially stop immunotherapy after her PET scan -answered all of patient's questions regarding potentially stopping immunotherapy in detail  -discussed plan to monitor with labs once every three months and scans every 6 months if patient decides to stop immunotherapy -  discussed option of Clindamycin to address boils in the groin -discussed options of Premarin ointment, cranberry extract tablets, or cranberry juice -will order Premarin vaginal cream -advised patient to stay regularly active -discussed options of being seen by Urogynecologist or urologist to evaluate if there is any retention  of the bladder -discussed that if the urologist sees any inflammation, immunotherapy may rarely cause inflammation in other organs  FOLLOW-UP: Pet/ct as scheduled Nivolumab per integrated scheduling  The total time spent in the appointment was 32 minutes* .  All of the patient's questions were answered with apparent satisfaction. The patient knows to call the clinic with any problems, questions or concerns.   Wyvonnia Lora MD MS AAHIVMS [facility] Hematology/Oncology Physician [facility]  .*Total Encounter Time as defined by the Centers for Medicare and Medicaid Services includes, in addition to the face-to-face time of a patient visit (documented in the note above) non-face-to-face time: obtaining and reviewing outside history, ordering and reviewing medications, tests or procedures, care coordination (communications with other health care professionals or caregivers) and documentation in the medical record.    I,Mitra Faeizi,acting as a Neurosurgeon for Wyvonnia Lora, MD.,have documented all relevant documentation on the behalf of Wyvonnia Lora, MD,as directed by  Wyvonnia Lora, MD while in the presence of Wyvonnia Lora, MD.  .I have reviewed the above documentation for accuracy and completeness, and I agree with the above. Johney Maine MD

## 2023-01-01 NOTE — Patient Instructions (Signed)
[facility]  Discharge Instructions: Thank you for choosing [facility] to provide your oncology and hematology care.   If you have a lab appointment with the [facility], please go directly to the [facility] and check in at the registration area.   Wear comfortable clothing and clothing appropriate for easy access to any Portacath or PICC line.   We strive to give you quality time with your provider. You may need to reschedule your appointment if you arrive late (15 or more minutes).  Arriving late affects you and other patients whose appointments are after yours.  Also, if you miss three or more appointments without notifying the office, you may be dismissed from the clinic at the provider's discretion.      For prescription refill requests, have your pharmacy contact our office and allow 72 hours for refills to be completed.    Today you received the following chemotherapy and/or immunotherapy agents: Opdivo      To help prevent nausea and vomiting after your treatment, we encourage you to take your nausea medication as directed.  BELOW ARE SYMPTOMS THAT SHOULD BE REPORTED IMMEDIATELY: *FEVER GREATER THAN 100.4 F (38 C) OR HIGHER *CHILLS OR SWEATING *NAUSEA AND VOMITING THAT IS NOT CONTROLLED WITH YOUR NAUSEA MEDICATION *UNUSUAL SHORTNESS OF BREATH *UNUSUAL BRUISING OR BLEEDING *URINARY PROBLEMS (pain or burning when urinating, or frequent urination) *BOWEL PROBLEMS (unusual diarrhea, constipation, pain near the anus) TENDERNESS IN MOUTH AND THROAT WITH OR WITHOUT PRESENCE OF ULCERS (sore throat, sores in mouth, or a toothache) UNUSUAL RASH, SWELLING OR PAIN  UNUSUAL VAGINAL DISCHARGE OR ITCHING   Items with * indicate a potential emergency and should be followed up as soon as possible or go to the Emergency Department if any problems should occur.  Please show the CHEMOTHERAPY ALERT CARD or IMMUNOTHERAPY ALERT CARD at check-in  to the Emergency Department and triage nurse.  Should you have questions after your visit or need to cancel or reschedule your appointment, please contact [facility]  Dept: [phone]  and follow the prompts.  Office hours are 8:00 a.m. to 4:30 p.m. Monday - Friday. Please note that voicemails left after 4:00 p.m. may not be returned until the following business day.  We are closed weekends and major holidays. You have access to a nurse at all times for urgent questions. Please call the main number to the clinic Dept: [phone] and follow the prompts.   For any non-urgent questions, you may also contact your provider using MyChart. We now offer e-Visits for anyone 45 and older to request care online for non-urgent symptoms. For details visit [email or web address].   Also download the MyChart app! Go to the app store, search "MyChart", open the app, select [facility], and log in with your MyChart username and password.

## 2023-01-02 ENCOUNTER — Encounter: Payer: Self-pay | Admitting: Hematology

## 2023-01-02 LAB — T4: T4, Total: 9.5 ug/dL (ref 4.5–12.0)

## 2023-01-06 ENCOUNTER — Encounter (HOSPITAL_COMMUNITY)
Admission: RE | Admit: 2023-01-06 | Discharge: 2023-01-06 | Disposition: A | Discharge status: Home / Self Care | Payer: Medicare Other | Source: Ambulatory Visit | Attending: Physician Assistant | Admitting: Physician Assistant

## 2023-01-06 ENCOUNTER — Inpatient Hospital Stay: Payer: Medicare Other

## 2023-01-06 DIAGNOSIS — Z95828 Presence of other vascular implants and grafts: Secondary | ICD-10-CM

## 2023-01-06 DIAGNOSIS — C642 Malignant neoplasm of left kidney, except renal pelvis: Secondary | ICD-10-CM | POA: Diagnosis present

## 2023-01-06 DIAGNOSIS — C7951 Secondary malignant neoplasm of bone: Secondary | ICD-10-CM | POA: Diagnosis present

## 2023-01-06 LAB — GLUCOSE, CAPILLARY: Glucose-Capillary: 120 mg/dL — ABNORMAL HIGH (ref 70–99)

## 2023-01-06 MED ORDER — SODIUM CHLORIDE 0.9% FLUSH: 10.0000 mL | INTRAVENOUS | Status: DC | PRN

## 2023-01-06 MED ORDER — FLUDEOXYGLUCOSE F - 18 (FDG) INJECTION
6.0700 | Freq: Once | INTRAVENOUS | Status: AC | PRN
  Administered 2023-01-06: 6.07 via INTRAVENOUS

## 2023-01-06 MED ORDER — SODIUM CHLORIDE 0.9% FLUSH
10.0000 mL | INTRAVENOUS | Status: AC | PRN
  Administered 2023-01-06: 10 mL

## 2023-01-07 ENCOUNTER — Encounter: Payer: Self-pay | Admitting: Hematology

## 2023-01-07 MED ORDER — CLINDAMYCIN PHOSPHATE 1 % EX GEL: Freq: Two times a day (BID) | CUTANEOUS | 0 refills | Status: DC

## 2023-01-09 ENCOUNTER — Encounter: Payer: Self-pay | Admitting: Hematology

## 2023-01-11 ENCOUNTER — Encounter: Payer: Self-pay | Admitting: Hematology

## 2023-01-15 ENCOUNTER — Inpatient Hospital Stay: Payer: Medicare Other

## 2023-01-15 ENCOUNTER — Ambulatory Visit: Payer: Medicare Other | Admitting: Physician Assistant

## 2023-01-22 ENCOUNTER — Encounter: Payer: Self-pay | Admitting: Hematology

## 2023-01-29 ENCOUNTER — Inpatient Hospital Stay (HOSPITAL_BASED_OUTPATIENT_CLINIC_OR_DEPARTMENT_OTHER): Payer: Medicare Other | Admitting: Hematology

## 2023-01-29 ENCOUNTER — Inpatient Hospital Stay: Payer: Medicare Other

## 2023-01-29 ENCOUNTER — Inpatient Hospital Stay: Payer: Medicare Other | Attending: Hematology

## 2023-01-29 VITALS — BP 139/74 | HR 77 | Temp 98.1°F | Resp 16 | Wt 122.0 lb

## 2023-01-29 DIAGNOSIS — M542 Cervicalgia: Secondary | ICD-10-CM | POA: Insufficient documentation

## 2023-01-29 DIAGNOSIS — I700 Atherosclerosis of aorta: Secondary | ICD-10-CM | POA: Diagnosis not present

## 2023-01-29 DIAGNOSIS — C7951 Secondary malignant neoplasm of bone: Secondary | ICD-10-CM | POA: Insufficient documentation

## 2023-01-29 DIAGNOSIS — K219 Gastro-esophageal reflux disease without esophagitis: Secondary | ICD-10-CM | POA: Diagnosis not present

## 2023-01-29 DIAGNOSIS — E1151 Type 2 diabetes mellitus with diabetic peripheral angiopathy without gangrene: Secondary | ICD-10-CM | POA: Diagnosis not present

## 2023-01-29 DIAGNOSIS — N281 Cyst of kidney, acquired: Secondary | ICD-10-CM | POA: Insufficient documentation

## 2023-01-29 DIAGNOSIS — R309 Painful micturition, unspecified: Secondary | ICD-10-CM | POA: Diagnosis not present

## 2023-01-29 DIAGNOSIS — C642 Malignant neoplasm of left kidney, except renal pelvis: Secondary | ICD-10-CM | POA: Diagnosis not present

## 2023-01-29 DIAGNOSIS — R202 Paresthesia of skin: Secondary | ICD-10-CM | POA: Diagnosis not present

## 2023-01-29 DIAGNOSIS — Z860100 Personal history of colon polyps, unspecified: Secondary | ICD-10-CM | POA: Diagnosis not present

## 2023-01-29 DIAGNOSIS — L732 Hidradenitis suppurativa: Secondary | ICD-10-CM | POA: Diagnosis not present

## 2023-01-29 DIAGNOSIS — I2510 Atherosclerotic heart disease of native coronary artery without angina pectoris: Secondary | ICD-10-CM | POA: Insufficient documentation

## 2023-01-29 DIAGNOSIS — R200 Anesthesia of skin: Secondary | ICD-10-CM | POA: Diagnosis not present

## 2023-01-29 DIAGNOSIS — Z87891 Personal history of nicotine dependence: Secondary | ICD-10-CM | POA: Insufficient documentation

## 2023-01-29 DIAGNOSIS — I129 Hypertensive chronic kidney disease with stage 1 through stage 4 chronic kidney disease, or unspecified chronic kidney disease: Secondary | ICD-10-CM | POA: Diagnosis not present

## 2023-01-29 DIAGNOSIS — Z5112 Encounter for antineoplastic immunotherapy: Secondary | ICD-10-CM | POA: Insufficient documentation

## 2023-01-29 DIAGNOSIS — E1136 Type 2 diabetes mellitus with diabetic cataract: Secondary | ICD-10-CM | POA: Diagnosis not present

## 2023-01-29 DIAGNOSIS — Z9221 Personal history of antineoplastic chemotherapy: Secondary | ICD-10-CM | POA: Insufficient documentation

## 2023-01-29 DIAGNOSIS — I252 Old myocardial infarction: Secondary | ICD-10-CM | POA: Diagnosis not present

## 2023-01-29 DIAGNOSIS — E034 Atrophy of thyroid (acquired): Secondary | ICD-10-CM | POA: Insufficient documentation

## 2023-01-29 DIAGNOSIS — E785 Hyperlipidemia, unspecified: Secondary | ICD-10-CM | POA: Diagnosis not present

## 2023-01-29 DIAGNOSIS — E1122 Type 2 diabetes mellitus with diabetic chronic kidney disease: Secondary | ICD-10-CM | POA: Diagnosis not present

## 2023-01-29 DIAGNOSIS — M7989 Other specified soft tissue disorders: Secondary | ICD-10-CM | POA: Insufficient documentation

## 2023-01-29 LAB — CMP (CANCER CENTER ONLY)
ALT: 12 U/L (ref 0–44)
AST: 11 U/L — ABNORMAL LOW (ref 15–41)
Albumin: 3.9 g/dL (ref 3.5–5.0)
Alkaline Phosphatase: 48 U/L (ref 38–126)
Anion gap: 7 (ref 5–15)
BUN: 33 mg/dL — ABNORMAL HIGH (ref 8–23)
CO2: 20 mmol/L — ABNORMAL LOW (ref 22–32)
Calcium: 8.9 mg/dL (ref 8.9–10.3)
Chloride: 108 mmol/L (ref 98–111)
Creatinine: 1.87 mg/dL — ABNORMAL HIGH (ref 0.44–1.00)
GFR, Estimated: 27 mL/min — ABNORMAL LOW (ref >60)
Glucose, Bld: 181 mg/dL — ABNORMAL HIGH (ref 70–99)
Potassium: 4.6 mmol/L (ref 3.5–5.1)
Sodium: 135 mmol/L (ref 135–145)
Total Bilirubin: 0.4 mg/dL (ref 0.3–1.2)
Total Protein: 6.8 g/dL (ref 6.5–8.1)

## 2023-01-29 LAB — TSH: TSH: 1.052 u[IU]/mL (ref 0.350–4.500)

## 2023-01-29 LAB — CBC WITH DIFFERENTIAL (CANCER CENTER ONLY)
Abs Immature Granulocytes: 0.16 10*3/uL — ABNORMAL HIGH (ref 0.00–0.07)
Basophils Absolute: 0.1 10*3/uL (ref 0.0–0.1)
Basophils Relative: 1 %
Eosinophils Absolute: 0.2 10*3/uL (ref 0.0–0.5)
Eosinophils Relative: 1 %
HCT: 42.3 % (ref 36.0–46.0)
Hemoglobin: 13.6 g/dL (ref 12.0–15.0)
Immature Granulocytes: 2 %
Lymphocytes Relative: 13 %
Lymphs Abs: 1.3 10*3/uL (ref 0.7–4.0)
MCH: 28 pg (ref 26.0–34.0)
MCHC: 32.2 g/dL (ref 30.0–36.0)
MCV: 87.2 fL (ref 80.0–100.0)
Monocytes Absolute: 1 10*3/uL (ref 0.1–1.0)
Monocytes Relative: 10 %
Neutro Abs: 7.7 10*3/uL (ref 1.7–7.7)
Neutrophils Relative %: 73 %
Platelet Count: 305 10*3/uL (ref 150–400)
RBC: 4.85 MIL/uL (ref 3.87–5.11)
RDW: 14.3 % (ref 11.5–15.5)
WBC Count: 10.4 10*3/uL (ref 4.0–10.5)
nRBC: 0 % (ref 0.0–0.2)

## 2023-01-29 MED ORDER — HEPARIN SOD (PORK) LOCK FLUSH 100 UNIT/ML IV SOLN
500.0000 [IU] | Freq: Once | INTRAVENOUS | Status: AC
  Administered 2023-01-29: 500 [IU] via INTRAVENOUS

## 2023-01-29 MED ORDER — SODIUM CHLORIDE 0.9% FLUSH
10.0000 mL | INTRAVENOUS | Status: DC | PRN
  Administered 2023-01-29: 10 mL via INTRAVENOUS

## 2023-01-29 MED ORDER — MUPIROCIN 2 % EX OINT: TOPICAL_OINTMENT | Freq: Two times a day (BID) | CUTANEOUS | 1 refills | Status: DC

## 2023-01-29 NOTE — Progress Notes (Signed)
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  SUMMARY OF ONCOLOGIC HISTORY: Oncology History  Cancer, 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HCC)  01/10/2017 Initial Diagnosis   Cancer, 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HCC)   01/02/2018 - 01/16/2022 Chemotherapy   Patient is on Treatment Plan : RENAL CELL CARCINOMA NIVOLUMAB Q14D     01/22/2022 Cancer Staging   Staging form: Bone - Appendicular Skeleton, Trunk, Skull, and Facial Bones, AJCC 8th Edition - Clinical: Stage IVB (pM1b) - Signed by Johney Maine, MD on 01/22/2022   02/01/2022 -  Chemotherapy   Patient is on Treatment Plan : RENAL CELL CARCINOMA Nivolumab (240) q14d/     Renal cell carcinoma, left (HCC)  04/11/2017 Initial Diagnosis   Renal cell carcinoma (HCC)   01/02/2018 - 01/16/2022 Chemotherapy   The patient had nivolumab (OPDIVO) 240 mg in sodium chloride 0.9 % 100 mL chemo infusion, 240 mg, Intravenous, Once, 47 of 50 cycles Administration: 240 mg (01/02/2018), 240 mg (01/15/2018), 240 mg (01/30/2018), 240 mg (02/13/2018), 240 mg (02/27/2018), 240 mg (03/13/2018), 240 mg (03/27/2018), 240 mg (04/10/2018), 240 mg (04/24/2018), 240 mg (05/08/2018), 240 mg (05/22/2018), 240 mg (06/05/2018), 240 mg (06/19/2018), 240 mg (08/28/2018), 240 mg (09/11/2018), 240 mg (10/09/2018), 240 mg (10/22/2018), 240 mg (11/06/2018), 240 mg (11/20/2018), 240 mg (12/04/2018), 240 mg (12/18/2018), 240 mg (01/01/2019), 240 mg (01/15/2019), 240 mg (01/29/2019), 240 mg (02/12/2019), 240 mg (02/26/2019), 240 mg (03/12/2019), 240 mg (03/26/2019), 240 mg (04/09/2019), 240 mg (04/28/2019), 240 mg (05/12/2019), 240 mg (05/27/2019), 240 mg (06/09/2019), 240 mg (06/23/2019), 240 mg (07/07/2019), 240 mg (07/21/2019), 240 mg (08/04/2019), 240 mg (08/18/2019), 240 mg (09/03/2019), 240 mg (09/15/2019), 240 mg (09/29/2019), 240 mg (10/12/2019), 240 mg (10/27/2019), 240 mg  (11/10/2019), 240 mg (12/08/2019), 240 mg (12/22/2019), 240 mg (01/19/2020)  for chemotherapy treatment.    02/01/2022 -  Chemotherapy   Patient is on Treatment Plan : RENAL CELL CARCINOMA Nivolumab (240) q14d/       CURRENT THERAPY: metastatic renal cell carcinoma, Rivka Barbara and Nivolumab  INTERVAL HISTORY:  Akyah Lagrange Fontanez 82 y.o. female is here for continued evaluation and management of metastatic renal cell carcinoma.   Patient was seen by me on 01/01/2023 and complained of bilateral foot swelling, open wounds in feet, ingrown nail on big toe, severe pain with urination, hidradenitis suppurativa with boil in left groin and two others, and abdominal pain.   Today, she reports that she continues to endorse Hidradenitis suppurativa, which has been bothersome. She notes having boils in her groin as well as a smaller boil in left shoulder area. She has not yet been seen by dermatology. Bactroban has helped manage symptoms more than Clindamycin.  Patient complains of persistent numbness and tingling in her left arm. She reports that her symptoms are positional. She does complain of neck pain.   Patient is considering receiving an epidural in her hip. She reports a history of scoliosis and sciatica.  Patient reports mild leg swelling. Her sleeping habits have improved recently. Patient continues to stay regularly active.   She reports that her previous ulcers have all healed from Bactroban and she continues to follow-up with a podiatrist. She denies any pain in the left femur, but does reports pain in the right femur.   Patient Active Problem List   Diagnosis Date Noted   PAD (peripheral artery disease) (HCC) 10/21/2022  Atrophy of thyroid (acquired) 01/15/2022   At risk for falls 01/15/2022   CKD (chronic kidney disease) 01/15/2022   Diabetic ulcer of foot with fat layer exposed (HCC) 01/15/2022   Dizziness 01/15/2022   Dysuria 01/15/2022   Knee osteoarthritis 01/15/2022   Pulmonary nodule  01/15/2022   Type 2 diabetes mellitus (HCC) 08/15/2021   Incisional hernia 06/23/2020   Gastritis and gastroduodenitis 02/26/2020   Rectal discomfort 02/26/2020   History of colonic polyps 02/26/2020   Ventral hernia 11/10/2019   History of cholecystectomy 07/17/2019   Degeneration of lumbar intervertebral disc 11/12/2018   Anemia 10/10/2018   Chronic cholecystitis 09/04/2018   Acute cholecystitis 08/29/2018   Biliary stricture 08/23/2018   Medicare annual wellness visit, subsequent 08/14/2018   CAD (coronary artery disease) 08/14/2018   Dyslipidemia 08/14/2018   Abnormal LFTs 08/05/2018   Choledocholithiasis 08/05/2018   Dilated bile duct 08/05/2018   History of ERCP 08/05/2018   History of renal cell carcinoma 08/05/2018   Port-A-Cath in place 01/30/2018   Counseling regarding advance care planning and goals of care 01/04/2018   Renal cell carcinoma, left (HCC) 04/11/2017   Cancer, 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HCC) 01/10/2017   Left renal mass 08/01/2016   Hyperlipidemia 05/23/2015   Essential hypertension 05/23/2015   Hypothyroidism 05/23/2015    is allergic to doxycycline, adhesive [tape], duloxetine, gabapentin (once-daily), nsaids, and statins.  MEDICAL HISTORY: Past Medical History:  Diagnosis Date   Anemia    Arthritis    lower back, hips, hands   Biliary stricture    Diabetes mellitus (HCC)    type 2    Early cataracts, bilateral    Md just watching   Elevated liver enzymes    Family history of adverse reaction to anesthesia    Daughter hard to wake up   Gallstones    GERD (gastroesophageal reflux disease)    occasional - diet controlled   History of blood transfusion 2018   History of hiatal hernia    HTN (hypertension)    Hyperlipidemia    Hypothyroidism    left renal ca dx'd 2018   renal cancer - left kidney removed, pill chemo x 1 yr   Myocardial infarction (HCC) 1991   no deficits   SVD (spontaneous vaginal delivery)    x 3   Wears glasses      SURGICAL HISTORY: Past Surgical History:  Procedure Laterality Date   ABDOMINAL AORTOGRAM W/LOWER EXTREMITY N/A 10/21/2022   Procedure: ABDOMINAL AORTOGRAM W/LOWER EXTREMITY;  Surgeon: Maeola Harman, MD;  Location: [facility] INVASIVE CV LAB;  Service: Cardiovascular;  Laterality: N/A;   BALLOON DILATION N/A 07/23/2018   Procedure: BALLOON DILATION;  Surgeon: Meridee Score Netty Starring., MD;  Location: [facility] ENDOSCOPY;  Service: Gastroenterology;  Laterality: N/A;   BILIARY BRUSHING  08/10/2018   Procedure: BILIARY BRUSHING;  Surgeon: Meridee Score Netty Starring., MD;  Location: [facility] ENDOSCOPY;  Service: Gastroenterology;;   BILIARY BRUSHING  11/30/2018   Procedure: BILIARY BRUSHING;  Surgeon: Lemar Lofty., MD;  Location: [facility] ENDOSCOPY;  Service: Gastroenterology;;   BILIARY DILATION  08/10/2018   Procedure: BILIARY DILATION;  Surgeon: Lemar Lofty., MD;  Location: [facility] ENDOSCOPY;  Service: Gastroenterology;;   BILIARY DILATION  08/27/2018   Procedure: BILIARY DILATION;  Surgeon: Lemar Lofty., MD;  Location: [facility] ENDOSCOPY;  Service: Gastroenterology;;   BILIARY DILATION  01/24/2020   Procedure: BILIARY DILATION;  Surgeon: Lemar Lofty., MD;  Location: [facility] ENDOSCOPY;  Service: Gastroenterology;;   BILIARY STENT PLACEMENT  08/10/2018  Procedure: BILIARY STENT PLACEMENT;  Surgeon: Meridee Score Netty Starring., MD;  Location: [facility] ENDOSCOPY;  Service: Gastroenterology;;   BILIARY STENT PLACEMENT  08/27/2018   Procedure: BILIARY STENT PLACEMENT;  Surgeon: Lemar Lofty., MD;  Location: [facility] ENDOSCOPY;  Service: Gastroenterology;;   BILIARY STENT PLACEMENT  11/30/2018   Procedure: BILIARY STENT PLACEMENT;  Surgeon: Lemar Lofty., MD;  Location: [facility] ENDOSCOPY;  Service: Gastroenterology;;   BIOPSY  07/23/2018   Procedure: BIOPSY;  Surgeon: Lemar Lofty., MD;  Location: [facility] ENDOSCOPY;  Service: Gastroenterology;;   BIOPSY  01/24/2020   Procedure: BIOPSY;  Surgeon:  Lemar Lofty., MD;  Location: [facility] ENDOSCOPY;  Service: Gastroenterology;;   CHOLECYSTECTOMY N/A 09/02/2018   Procedure: LAPAROSCOPIC CHOLECYSTECTOMY;  Surgeon: Almond Lint, MD;  Location: [facility] OR;  Service: General;  Laterality: N/A;   COLONOSCOPY     normal    ENDOSCOPIC RETROGRADE CHOLANGIOPANCREATOGRAPHY (ERCP) WITH PROPOFOL N/A 08/10/2018   Procedure: ENDOSCOPIC RETROGRADE CHOLANGIOPANCREATOGRAPHY (ERCP) WITH PROPOFOL;  Surgeon: Lemar Lofty., MD;  Location: [facility] ENDOSCOPY;  Service: Gastroenterology;  Laterality: N/A;   ENDOSCOPIC RETROGRADE CHOLANGIOPANCREATOGRAPHY (ERCP) WITH PROPOFOL N/A 11/30/2018   Procedure: ENDOSCOPIC RETROGRADE CHOLANGIOPANCREATOGRAPHY (ERCP) WITH PROPOFOL;  Surgeon: Meridee Score Netty Starring., MD;  Location: [facility] ENDOSCOPY;  Service: Gastroenterology;  Laterality: N/A;   ENDOSCOPIC RETROGRADE CHOLANGIOPANCREATOGRAPHY (ERCP) WITH PROPOFOL N/A 01/24/2020   Procedure: ENDOSCOPIC RETROGRADE CHOLANGIOPANCREATOGRAPHY (ERCP) WITH PROPOFOL;  Surgeon: Meridee Score Netty Starring., MD;  Location: [facility] ENDOSCOPY;  Service: Gastroenterology;  Laterality: N/A;   ERCP N/A 07/23/2018   Procedure: ENDOSCOPIC RETROGRADE CHOLANGIOPANCREATOGRAPHY (ERCP);  Surgeon: Lemar Lofty., MD;  Location: [facility] ENDOSCOPY;  Service: Gastroenterology;  Laterality: N/A;   ERCP N/A 08/27/2018   Procedure: ENDOSCOPIC RETROGRADE CHOLANGIOPANCREATOGRAPHY (ERCP);  Surgeon: Lemar Lofty., MD;  Location: [facility] ENDOSCOPY;  Service: Gastroenterology;  Laterality: N/A;   ESOPHAGOGASTRODUODENOSCOPY N/A 01/24/2020   Procedure: ESOPHAGOGASTRODUODENOSCOPY (EGD);  Surgeon: Lemar Lofty., MD;  Location: [facility] ENDOSCOPY;  Service: Gastroenterology;  Laterality: N/A;   ESOPHAGOGASTRODUODENOSCOPY (EGD) WITH PROPOFOL N/A 08/27/2018   Procedure: ESOPHAGOGASTRODUODENOSCOPY (EGD) WITH PROPOFOL;  Surgeon: Meridee Score Netty Starring., MD;  Location: [facility] ENDOSCOPY;  Service: Gastroenterology;  Laterality: N/A;   EUS  N/A 08/27/2018   Procedure: ESOPHAGEAL ENDOSCOPIC ULTRASOUND (EUS) RADIAL;  Surgeon: Meridee Score Netty Starring., MD;  Location: [facility] ENDOSCOPY;  Service: Gastroenterology;  Laterality: N/A;   FINE NEEDLE ASPIRATION  08/27/2018   Procedure: FINE NEEDLE ASPIRATION (FNA) LINEAR;  Surgeon: Lemar Lofty., MD;  Location: [facility] ENDOSCOPY;  Service: Gastroenterology;;   Sherald Hess HERNIA REPAIR N/A 06/23/2020   Procedure: OPEN INCISIONAL HERNIA REPAIR WITH MESH;  Surgeon: Quentin Ore, MD;  Location: [facility] ORS;  Service: General;  Laterality: N/A;  ROOM 2 STARTING AT 11:00AM FOR 90 MIN   IR IMAGING GUIDED PORT INSERTION  01/08/2018   LAPAROSCOPIC NEPHRECTOMY Left 08/01/2016   Procedure: LAPAROSCOPIC  RADICAL NEPHRECTOMY/ REPAIR OF UMBILICAL HERNIA;  Surgeon: Heloise Purpura, MD;  Location: [facility] ORS;  Service: Urology;  Laterality: Left;   PERIPHERAL VASCULAR INTERVENTION  10/21/2022   Procedure: PERIPHERAL VASCULAR INTERVENTION;  Surgeon: Maeola Harman, MD;  Location: [facility] INVASIVE CV LAB;  Service: Cardiovascular;;   REMOVAL OF STONES  07/23/2018   Procedure: REMOVAL OF GALL STONES;  Surgeon: Meridee Score Netty Starring., MD;  Location: [facility] ENDOSCOPY;  Service: Gastroenterology;;   REMOVAL OF STONES  08/10/2018   Procedure: REMOVAL OF STONES;  Surgeon: Lemar Lofty., MD;  Location: [facility] ENDOSCOPY;  Service: Gastroenterology;;   REMOVAL OF STONES  08/27/2018   Procedure: REMOVAL OF STONES;  Surgeon: Meridee Score Netty Starring., MD;  Location: [facility] ENDOSCOPY;  Service: Gastroenterology;;   REMOVAL OF STONES  01/24/2020   Procedure: REMOVAL OF STONES;  Surgeon: Lemar Lofty., MD;  Location: [facility] ENDOSCOPY;  Service: Gastroenterology;;   Dennison Mascot  07/23/2018   Procedure: Dennison Mascot;  Surgeon: Lemar Lofty., MD;  Location: [facility] ENDOSCOPY;  Service: Gastroenterology;;   Francine Graven REMOVAL  08/27/2018   Procedure: STENT REMOVAL;  Surgeon: Lemar Lofty., MD;  Location: [facility] ENDOSCOPY;   Service: Gastroenterology;;   Francine Graven REMOVAL  11/30/2018   Procedure: STENT REMOVAL;  Surgeon: Lemar Lofty., MD;  Location: [facility] ENDOSCOPY;  Service: Gastroenterology;;   Francine Graven REMOVAL  01/24/2020   Procedure: STENT REMOVAL;  Surgeon: Lemar Lofty., MD;  Location: [facility] ENDOSCOPY;  Service: Gastroenterology;;   UPPER GI ENDOSCOPY     x 1   VAGINAL PROLAPSE REPAIR  11/18/2019   [facility]    SOCIAL HISTORY: Social History   Socioeconomic History   Marital status: Widowed    Spouse name: Not on file   Number of children: 3   Years of education: Not on file   Highest education level: Not on file  Occupational History   Occupation: retired  Tobacco Use   Smoking status: Former    Current packs/day: 0.00    Average packs/day: 1 pack/day for 8.0 years (8.0 ttl pk-yrs)    Types: Cigarettes    Start date: 06/18/1956    Quit date: 06/18/1964    Years since quitting: 58.6   Smokeless tobacco: Never  Vaping Use   Vaping status: Never Used  Substance and Sexual Activity   Alcohol use: No   Drug use: No   Sexual activity: Not Currently    Birth control/protection: Post-menopausal  Other Topics Concern   Not on file  Social History Narrative   Married.  Three children   Social Determinants of Health   Financial Resource Strain: Low Risk  (05/23/2022)   Overall Financial Resource Strain (CARDIA)    Difficulty of Paying Living Expenses: Not hard at all  Food Insecurity: No Food Insecurity (05/23/2022)   Hunger Vital Sign    Worried About Running Out of Food in the Last Year: Never true    Ran Out of Food in the Last Year: Never true  Transportation Needs: No Transportation Needs (05/23/2022)   PRAPARE - Administrator, Civil Service (Medical): No    Lack of Transportation (Non-Medical): No  Physical Activity: Not on file  Stress: Not on file  Social Connections: Not on file  Intimate Partner Violence: Not At Risk (05/23/2022)   Humiliation, Afraid, Rape, and  Kick questionnaire    Fear of Current or Ex-Partner: No    Emotionally Abused: No    Physically Abused: No    Sexually Abused: No    FAMILY HISTORY: Family History  Problem Relation Age of Onset   Heart attack Father 48   Heart disease Brother 106       CABG   Colon cancer Neg Hx    Esophageal cancer Neg Hx    Inflammatory bowel disease Neg Hx    Liver disease Neg Hx    Pancreatic cancer Neg Hx    Rectal cancer Neg Hx    Stomach cancer Neg Hx      ROS  10 Point review of Systems was done is negative except as noted above.   PHYSICAL EXAMINATION .BP 139/74 (BP Location: Right Arm, Patient Position: Sitting)   Pulse 77  Temp 98.1 F (36.7 C) (Oral)   Resp 16   Wt 122 lb (55.3 kg)   LMP  (LMP Unknown)   SpO2 100%   BMI 25.50 kg/m    GENERAL:alert, in no acute distress and comfortable SKIN: no acute rashes, no significant lesions EYES: conjunctiva are pink and non-injected, sclera anicteric OROPHARYNX: MMM, no exudates, no oropharyngeal erythema or ulceration NECK: supple, no JVD LYMPH:  no palpable lymphadenopathy in the cervical, axillary or inguinal regions LUNGS: clear to auscultation b/l with normal respiratory effort HEART: regular rate & rhythm ABDOMEN:  normoactive bowel sounds , non tender, not distended. Extremity: no pedal edema PSYCH: alert & oriented x 3 with fluent speech NEURO: no focal motor/sensory deficits    LABORATORY DATA: .    Latest Ref Rng & Units 01/29/2023   10:43 AM 01/01/2023   10:17 AM 12/18/2022    8:49 AM  CBC  WBC 4.0 - 10.5 K/uL 10.4  10.5  9.1   Hemoglobin 12.0 - 15.0 g/dL 16.1  09.6  04.5   Hematocrit 36.0 - 46.0 % 42.3  42.2  41.2   Platelets 150 - 400 K/uL 305  298  239    .    Latest Ref Rng & Units 01/29/2023   10:43 AM 01/01/2023   10:17 AM 12/18/2022    8:49 AM  CMP  Glucose 70 - 99 mg/dL 409  811  914   BUN 8 - 23 mg/dL 33  23  31   Creatinine 0.44 - 1.00 mg/dL 7.82  9.56  2.13   Sodium 135 - 145 mmol/L 135   139  139   Potassium 3.5 - 5.1 mmol/L 4.6  4.1  4.1   Chloride 98 - 111 mmol/L 108  113  115   CO2 22 - 32 mmol/L 20  19  18    Calcium 8.9 - 10.3 mg/dL 8.9  9.0  7.9   Total Protein 6.5 - 8.1 g/dL 6.8  6.7  6.4   Total Bilirubin 0.3 - 1.2 mg/dL 0.4  0.6  0.4   Alkaline Phos 38 - 126 U/L 48  50  49   AST 15 - 41 U/L 11  12  9    ALT 0 - 44 U/L 12  10  9     RADIOLOGY    CLINICAL DATA:  Subsequent treatment strategy for metastatic renal cell carcinoma.   EXAM: NUCLEAR MEDICINE PET SKULL BASE TO THIGH   TECHNIQUE: 6.32 mCi F-18 FDG was injected intravenously. Full-ring PET imaging was performed from the skull base to thigh after the radiotracer. CT data was obtained and used for attenuation correction and anatomic localization.   Fasting blood glucose: 159 mg/dl   COMPARISON:  PET-CT 08/65/7846 and 02/09/2020.   FINDINGS: Mediastinal blood pool activity: SUV max 2.7   NECK:   No hypermetabolic cervical lymph nodes are identified.There are no lesions of the pharyngeal mucosal space. Activity associated with the muscles of phonation is within physiologic limits and similar to previous study.   Incidental CT findings: Bilateral carotid atherosclerosis.   CHEST:   There are no hypermetabolic mediastinal, hilar or axillary lymph nodes. No hypermetabolic pulmonary activity or suspicious nodularity.   Incidental CT findings: Right IJ Port-A-Cath extends to the superior cavoatrial junction. There is diffuse atherosclerosis of the aorta, great vessels and coronary arteries. Mild linear scarring at both lung bases appears unchanged.   ABDOMEN/PELVIS:   There is no hypermetabolic activity within the liver, adrenal glands, spleen or pancreas.  There is no hypermetabolic nodal activity. Stable appearance of the right kidney. No hypermetabolic activity within the left nephrectomy bed   Incidental CT findings: Diffuse aortic and branch vessel atherosclerosis, scattered colonic  diverticulosis and pelvic floor laxity are again noted.   SKELETON:   There is no hypermetabolic activity to suggest osseous metastatic disease.   Incidental CT findings: Grossly stable sclerotic lesions within the thoracic spine, without hypermetabolic activity. Lower lumbar spondylosis and sacroiliac degenerative changes bilaterally.   IMPRESSION: 1. Stable PET-CT status post left nephrectomy. No evidence of local recurrence or hypermetabolic metastatic disease. 2. Stable incidental findings including coronary and Aortic Atherosclerosis (ICD10-I70.0). Pelvic floor laxity.     Electronically Signed   By: Carey Bullocks M.D.   On: 04/03/2021 12:30      ASSESSMENT and PLAN:   MYCHAELA LENNARTZ is a 82 y.o. female who returns for a follow up for metastatic renal cell carcinoma.   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 -Rt adrenal gland bx - showed clear cell RCC -S/p Cytoreductive left radical nephrectomy and left adrenal gland resection on 08/01/2016 by Dr Laverle Patter. -Started systemic therapy with Nivolumab 240 mg on 01/02/2018 and Xgeva on 02/27/2018.  -Most recent PET scan from 11/09/2021 showed no clear evidence of new or progressive disease.   # Hypothyroidism/Adrenal insufficiency/Diabetes -Continue being followed by Dr. Sharl Ma   #H/o Choledocholithiasis and cholelithiasis -07/10/18 US Abdomen revealed Biliary duct dilatation with 8 mm calculus at the level of the ampulla in the distal common bile duct. 2. Cholelithiasis. No gallbladder wall thickening or pericholecystic fluid. 3. Appearance of the liver raises concern for underlying hepatic cirrhosis. No focal liver lesions are demonstrable. 4.  Left kidney absent. 5.  Small cysts in right kidney. 6.  Aortic Atherosclerosis. -S/p ERCP on 07/23/18 with Dr. Vicente Serene Mansouraty -09/02/18 Cytopathology from ERCP was suspicious for  malignant cells in bile duct, but was not definitive, other two findings were benign.  #Right foot wound 2/2 PAD and DM: --Last evaluated by wound care from [facility] in [place]. --Recent completed course of cephalexin therapy approximately one week ago --Recommend follow up with wound care. Sent another round of abx therapy with Augmentin as patient is unable to tolerate doxycycline and has D2D interaction with bactrim. Trying to avoid clindamycin due to age and risk of C.diff.   PLAN:    -Discussed lab results on 01/29/23 in detail with patient. CBC showed WBC of 10.4K, hemoglobin of 13.6, and platelets of 305K. -CMP stable -discussed results of 01/06/2023 PET scan, which showed focal hypermetabolism in the left mid/distal femoral shaft, without CT correlate. Isolated osseous metastasis is not excluded. Attention on follow-up was recommended. Otherwise, no findings suspicious for metastatic disease. Mild left inguinal stranding, favoring postsurgical changes. -will continue to monitor spot in distal femur -will stop immunotherapy at this time -patient would prefer to keep her port in place, which will require regular port flush -discussed option to save Bactroban for more specific use so that there is no resistance -recommend patient to wear loose clothing to manage Hidradenitis suppurativa symptoms -discussed that Hidradenitis suppurativa may potentially be related to stress -left arm tingling could be related to a pinched nerve in the neck -recommend patient to connect with orthopedics or PCP regarding LUE tingling for further evaluation -will plan for port flush in 2 months -she shall return to clinic in 4 months, unless she develops any new symptoms.  Will plan for PET scan prior to her next visit.  -answered all of her questions in detail  FOLLOW-UP: Portflush every 2 months x 6 PET/CT in 15 weeks RTC with Dr Candise Che with labs in 16 weeks  The total time spent in the appointment was 30  minutes* .  All of the patient's questions were answered with apparent satisfaction. The patient knows to call the clinic with any problems, questions or concerns.   Wyvonnia Lora MD MS AAHIVMS [facility] Hematology/Oncology Physician [facility]  .*Total Encounter Time as defined by the Centers for Medicare and Medicaid Services includes, in addition to the face-to-face time of a patient visit (documented in the note above) non-face-to-face time: obtaining and reviewing outside history, ordering and reviewing medications, tests or procedures, care coordination (communications with other health care professionals or caregivers) and documentation in the medical record.    I,Mitra Faeizi,acting as a Neurosurgeon for Wyvonnia Lora, MD.,have documented all relevant documentation on the behalf of Wyvonnia Lora, MD,as directed by  Wyvonnia Lora, MD while in the presence of Wyvonnia Lora, MD.  .I have reviewed the above documentation for accuracy and completeness, and I agree with the above. Johney Maine MD

## 2023-01-30 LAB — T4: T4, Total: 8.6 ug/dL (ref 4.5–12.0)

## 2023-01-31 ENCOUNTER — Encounter: Payer: Self-pay | Admitting: Hematology

## 2023-02-01 ENCOUNTER — Other Ambulatory Visit: Payer: Self-pay

## 2023-02-04 ENCOUNTER — Other Ambulatory Visit: Payer: Self-pay | Admitting: Hematology

## 2023-02-04 ENCOUNTER — Encounter: Payer: Self-pay | Admitting: Hematology

## 2023-02-05 ENCOUNTER — Encounter: Payer: Self-pay | Admitting: Hematology

## 2023-02-12 ENCOUNTER — Inpatient Hospital Stay: Payer: Medicare Other

## 2023-03-11 ENCOUNTER — Other Ambulatory Visit: Payer: Self-pay | Admitting: Hematology

## 2023-03-18 ENCOUNTER — Encounter: Payer: Self-pay | Admitting: Hematology

## 2023-03-28 ENCOUNTER — Telehealth: Payer: Self-pay | Admitting: *Deleted

## 2023-03-28 NOTE — Telephone Encounter (Signed)
Spoke with patient regarding appt on 12/11.  Pt agreeable to move to afternoon.  Pt verbalized she "prefers afternoon appts".  All future port flush appts moved to afternoon as well.

## 2023-04-02 ENCOUNTER — Inpatient Hospital Stay: Payer: Medicare Other | Attending: Hematology

## 2023-04-02 DIAGNOSIS — C642 Malignant neoplasm of left kidney, except renal pelvis: Secondary | ICD-10-CM | POA: Insufficient documentation

## 2023-04-02 DIAGNOSIS — Z95828 Presence of other vascular implants and grafts: Secondary | ICD-10-CM

## 2023-04-02 MED ORDER — HEPARIN SOD (PORK) LOCK FLUSH 100 UNIT/ML IV SOLN
500.0000 [IU] | Freq: Once | INTRAVENOUS | Status: AC
  Administered 2023-04-02: 500 [IU] via INTRAVENOUS

## 2023-04-02 MED ORDER — SODIUM CHLORIDE 0.9% FLUSH
10.0000 mL | Freq: Once | INTRAVENOUS | Status: AC
  Administered 2023-04-02: 10 mL via INTRAVENOUS

## 2023-04-12 ENCOUNTER — Other Ambulatory Visit: Payer: Self-pay

## 2023-04-12 ENCOUNTER — Encounter: Payer: Self-pay | Admitting: Hematology

## 2023-04-12 DIAGNOSIS — C642 Malignant neoplasm of left kidney, except renal pelvis: Secondary | ICD-10-CM

## 2023-04-12 DIAGNOSIS — C7951 Secondary malignant neoplasm of bone: Secondary | ICD-10-CM

## 2023-04-15 ENCOUNTER — Telehealth: Payer: Self-pay

## 2023-04-15 ENCOUNTER — Encounter: Payer: Self-pay | Admitting: Hematology

## 2023-04-15 MED ORDER — TRAMADOL HCL 50 MG PO TABS: 50.0000 mg | ORAL_TABLET | Freq: Four times a day (QID) | ORAL | 0 refills | Status: DC | PRN

## 2023-04-15 MED ORDER — LIDOCAINE 5 % EX PTCH: 1.0000 | MEDICATED_PATCH | Freq: Every day | CUTANEOUS | 1 refills | Status: DC | PRN

## 2023-04-15 NOTE — Telephone Encounter (Signed)
Notified Patient by voicemail of prior authorization approval for Lidoderm 5% Patches. Medication is approved until further notice. No other needs or concerns noted at this time.

## 2023-04-22 ENCOUNTER — Encounter: Payer: Self-pay | Admitting: Hematology

## 2023-04-22 ENCOUNTER — Other Ambulatory Visit: Payer: Self-pay

## 2023-04-22 DIAGNOSIS — C7951 Secondary malignant neoplasm of bone: Secondary | ICD-10-CM

## 2023-04-22 DIAGNOSIS — C642 Malignant neoplasm of left kidney, except renal pelvis: Secondary | ICD-10-CM

## 2023-04-22 MED ORDER — LIDOCAINE 5 % EX PTCH: 1.0000 | MEDICATED_PATCH | Freq: Every day | CUTANEOUS | 1 refills | Status: DC | PRN

## 2023-04-29 ENCOUNTER — Encounter: Payer: Self-pay | Admitting: Hematology

## 2023-05-08 ENCOUNTER — Encounter: Payer: Self-pay | Admitting: Hematology

## 2023-05-19 ENCOUNTER — Inpatient Hospital Stay: Payer: Medicare Other

## 2023-05-19 ENCOUNTER — Telehealth: Payer: Self-pay | Admitting: Hematology

## 2023-05-19 ENCOUNTER — Encounter (HOSPITAL_COMMUNITY): Payer: Medicare Other

## 2023-05-21 ENCOUNTER — Encounter (HOSPITAL_COMMUNITY): Payer: Medicare Other

## 2023-05-21 ENCOUNTER — Ambulatory Visit: Payer: Medicare Other

## 2023-05-22 ENCOUNTER — Inpatient Hospital Stay: Payer: Medicare Other

## 2023-05-22 ENCOUNTER — Encounter (HOSPITAL_COMMUNITY): Payer: Medicare Other

## 2023-05-27 ENCOUNTER — Other Ambulatory Visit: Payer: Self-pay

## 2023-05-27 ENCOUNTER — Encounter: Payer: Self-pay | Admitting: Hematology

## 2023-05-27 DIAGNOSIS — C642 Malignant neoplasm of left kidney, except renal pelvis: Secondary | ICD-10-CM

## 2023-05-29 ENCOUNTER — Encounter: Payer: Self-pay | Admitting: Hematology

## 2023-05-29 ENCOUNTER — Inpatient Hospital Stay: Payer: Medicare Other | Attending: Hematology

## 2023-05-29 ENCOUNTER — Encounter (HOSPITAL_COMMUNITY)
Admission: RE | Admit: 2023-05-29 | Discharge: 2023-05-29 | Disposition: A | Discharge status: Home / Self Care | Payer: Medicare Other | Source: Ambulatory Visit | Attending: Hematology | Admitting: Hematology

## 2023-05-29 DIAGNOSIS — C7951 Secondary malignant neoplasm of bone: Secondary | ICD-10-CM | POA: Insufficient documentation

## 2023-05-29 DIAGNOSIS — C642 Malignant neoplasm of left kidney, except renal pelvis: Secondary | ICD-10-CM | POA: Insufficient documentation

## 2023-05-29 LAB — GLUCOSE, CAPILLARY: Glucose-Capillary: 61 mg/dL — ABNORMAL LOW (ref 70–99)

## 2023-05-29 MED ORDER — FLUDEOXYGLUCOSE F - 18 (FDG) INJECTION
6.1000 | Freq: Once | INTRAVENOUS | Status: AC
  Administered 2023-05-29: 5.42 via INTRAVENOUS

## 2023-05-29 MED ORDER — TRAMADOL HCL 50 MG PO TABS: 50.0000 mg | ORAL_TABLET | Freq: Four times a day (QID) | ORAL | 0 refills | Status: DC | PRN

## 2023-05-29 NOTE — Telephone Encounter (Signed)
 WIll refill this one time but is chronic non cancer pain --will need to connect with PCP for continued management of this. Thx GK

## 2023-05-30 ENCOUNTER — Encounter (INDEPENDENT_AMBULATORY_CARE_PROVIDER_SITE_OTHER): Payer: Self-pay | Admitting: Geriatric Medicine

## 2023-06-03 ENCOUNTER — Encounter: Payer: Self-pay | Admitting: Hematology

## 2023-06-04 ENCOUNTER — Inpatient Hospital Stay: Payer: Medicare Other | Admitting: Hematology

## 2023-06-04 ENCOUNTER — Inpatient Hospital Stay: Payer: Medicare Other

## 2023-06-23 ENCOUNTER — Inpatient Hospital Stay (HOSPITAL_BASED_OUTPATIENT_CLINIC_OR_DEPARTMENT_OTHER): Payer: Medicare Other | Admitting: Hematology

## 2023-06-23 ENCOUNTER — Inpatient Hospital Stay: Payer: Medicare Other | Attending: Hematology

## 2023-06-23 VITALS — BP 138/72 | HR 74 | Temp 97.7°F | Resp 17 | Wt 119.8 lb

## 2023-06-23 DIAGNOSIS — Z9221 Personal history of antineoplastic chemotherapy: Secondary | ICD-10-CM | POA: Diagnosis not present

## 2023-06-23 DIAGNOSIS — C7951 Secondary malignant neoplasm of bone: Secondary | ICD-10-CM

## 2023-06-23 DIAGNOSIS — Z8719 Personal history of other diseases of the digestive system: Secondary | ICD-10-CM | POA: Diagnosis not present

## 2023-06-23 DIAGNOSIS — I700 Atherosclerosis of aorta: Secondary | ICD-10-CM | POA: Diagnosis not present

## 2023-06-23 DIAGNOSIS — E785 Hyperlipidemia, unspecified: Secondary | ICD-10-CM | POA: Diagnosis not present

## 2023-06-23 DIAGNOSIS — M899 Disorder of bone, unspecified: Secondary | ICD-10-CM

## 2023-06-23 DIAGNOSIS — C642 Malignant neoplasm of left kidney, except renal pelvis: Secondary | ICD-10-CM | POA: Diagnosis not present

## 2023-06-23 DIAGNOSIS — E1140 Type 2 diabetes mellitus with diabetic neuropathy, unspecified: Secondary | ICD-10-CM | POA: Diagnosis not present

## 2023-06-23 DIAGNOSIS — K8020 Calculus of gallbladder without cholecystitis without obstruction: Secondary | ICD-10-CM | POA: Diagnosis not present

## 2023-06-23 DIAGNOSIS — E11621 Type 2 diabetes mellitus with foot ulcer: Secondary | ICD-10-CM | POA: Insufficient documentation

## 2023-06-23 DIAGNOSIS — E1151 Type 2 diabetes mellitus with diabetic peripheral angiopathy without gangrene: Secondary | ICD-10-CM | POA: Diagnosis not present

## 2023-06-23 DIAGNOSIS — Z860100 Personal history of colon polyps, unspecified: Secondary | ICD-10-CM | POA: Diagnosis not present

## 2023-06-23 DIAGNOSIS — Z85528 Personal history of other malignant neoplasm of kidney: Secondary | ICD-10-CM | POA: Insufficient documentation

## 2023-06-23 DIAGNOSIS — E034 Atrophy of thyroid (acquired): Secondary | ICD-10-CM | POA: Diagnosis not present

## 2023-06-23 DIAGNOSIS — N189 Chronic kidney disease, unspecified: Secondary | ICD-10-CM | POA: Diagnosis not present

## 2023-06-23 DIAGNOSIS — Z87891 Personal history of nicotine dependence: Secondary | ICD-10-CM | POA: Diagnosis not present

## 2023-06-23 DIAGNOSIS — E2740 Unspecified adrenocortical insufficiency: Secondary | ICD-10-CM | POA: Diagnosis not present

## 2023-06-23 DIAGNOSIS — I129 Hypertensive chronic kidney disease with stage 1 through stage 4 chronic kidney disease, or unspecified chronic kidney disease: Secondary | ICD-10-CM | POA: Diagnosis not present

## 2023-06-23 DIAGNOSIS — I252 Old myocardial infarction: Secondary | ICD-10-CM | POA: Diagnosis not present

## 2023-06-23 DIAGNOSIS — N281 Cyst of kidney, acquired: Secondary | ICD-10-CM | POA: Diagnosis not present

## 2023-06-23 DIAGNOSIS — E1122 Type 2 diabetes mellitus with diabetic chronic kidney disease: Secondary | ICD-10-CM | POA: Insufficient documentation

## 2023-06-23 DIAGNOSIS — I2510 Atherosclerotic heart disease of native coronary artery without angina pectoris: Secondary | ICD-10-CM | POA: Insufficient documentation

## 2023-06-23 LAB — CBC WITH DIFFERENTIAL (CANCER CENTER ONLY)
Abs Immature Granulocytes: 0.13 10*3/uL — ABNORMAL HIGH (ref 0.00–0.07)
Basophils Absolute: 0.1 10*3/uL (ref 0.0–0.1)
Basophils Relative: 1 %
Eosinophils Absolute: 0.1 10*3/uL (ref 0.0–0.5)
Eosinophils Relative: 1 %
HCT: 38.8 % (ref 36.0–46.0)
Hemoglobin: 12.5 g/dL (ref 12.0–15.0)
Immature Granulocytes: 2 %
Lymphocytes Relative: 11 %
Lymphs Abs: 1 10*3/uL (ref 0.7–4.0)
MCH: 28.2 pg (ref 26.0–34.0)
MCHC: 32.2 g/dL (ref 30.0–36.0)
MCV: 87.6 fL (ref 80.0–100.0)
Monocytes Absolute: 0.8 10*3/uL (ref 0.1–1.0)
Monocytes Relative: 10 %
Neutro Abs: 6.6 10*3/uL (ref 1.7–7.7)
Neutrophils Relative %: 75 %
Platelet Count: 260 10*3/uL (ref 150–400)
RBC: 4.43 MIL/uL (ref 3.87–5.11)
RDW: 15.4 % (ref 11.5–15.5)
WBC Count: 8.7 10*3/uL (ref 4.0–10.5)
nRBC: 0 % (ref 0.0–0.2)

## 2023-06-23 LAB — CMP (CANCER CENTER ONLY)
ALT: 93 U/L — ABNORMAL HIGH (ref 0–44)
AST: 54 U/L — ABNORMAL HIGH (ref 15–41)
Albumin: 3.5 g/dL (ref 3.5–5.0)
Alkaline Phosphatase: 92 U/L (ref 38–126)
Anion gap: 7 (ref 5–15)
BUN: 17 mg/dL (ref 8–23)
CO2: 22 mmol/L (ref 22–32)
Calcium: 9.4 mg/dL (ref 8.9–10.3)
Chloride: 106 mmol/L (ref 98–111)
Creatinine: 1.39 mg/dL — ABNORMAL HIGH (ref 0.44–1.00)
GFR, Estimated: 38 mL/min — ABNORMAL LOW (ref >60)
Glucose, Bld: 202 mg/dL — ABNORMAL HIGH (ref 70–99)
Potassium: 3.8 mmol/L (ref 3.5–5.1)
Sodium: 135 mmol/L (ref 135–145)
Total Bilirubin: 0.5 mg/dL (ref 0.0–1.2)
Total Protein: 6.1 g/dL — ABNORMAL LOW (ref 6.5–8.1)

## 2023-06-23 MED ORDER — SODIUM CHLORIDE 0.9% FLUSH
10.0000 mL | Freq: Once | INTRAVENOUS | Status: AC
Start: 2023-06-23 — End: 2023-06-23
  Administered 2023-06-23: 10 mL

## 2023-06-23 MED ORDER — ACYCLOVIR 400 MG PO TABS: 400.0000 mg | ORAL_TABLET | Freq: Every day | ORAL | 1 refills | Status: DC

## 2023-06-23 MED ORDER — HEPARIN SOD (PORK) LOCK FLUSH 100 UNIT/ML IV SOLN
500.0000 [IU] | Freq: Once | INTRAVENOUS | Status: AC
Start: 2023-06-23 — End: 2023-06-23
  Administered 2023-06-23: 500 [IU]

## 2023-06-24 ENCOUNTER — Telehealth: Payer: Self-pay | Admitting: Hematology

## 2023-06-24 NOTE — Telephone Encounter (Signed)
 Patient is aware of scheduled appointment times/dates for telephone visit as per 06/23/2023 scheduling orders

## 2023-06-25 ENCOUNTER — Ambulatory Visit (INDEPENDENT_AMBULATORY_CARE_PROVIDER_SITE_OTHER): Payer: Medicare Other | Admitting: Physician Assistant

## 2023-06-25 ENCOUNTER — Ambulatory Visit (HOSPITAL_COMMUNITY)
Admission: RE | Admit: 2023-06-25 | Discharge: 2023-06-25 | Disposition: A | Discharge status: Home / Self Care | Payer: Medicare Other | Source: Ambulatory Visit | Attending: Vascular Surgery | Admitting: Vascular Surgery

## 2023-06-25 ENCOUNTER — Ambulatory Visit (INDEPENDENT_AMBULATORY_CARE_PROVIDER_SITE_OTHER)
Admission: RE | Admit: 2023-06-25 | Discharge: 2023-06-25 | Disposition: A | Discharge status: Home / Self Care | Payer: Medicare Other | Source: Ambulatory Visit | Attending: Vascular Surgery | Admitting: Vascular Surgery

## 2023-06-25 VITALS — BP 126/73 | HR 77 | Temp 98.5°F | Resp 18 | Ht <= 58 in | Wt 118.6 lb

## 2023-06-25 DIAGNOSIS — I739 Peripheral vascular disease, unspecified: Secondary | ICD-10-CM

## 2023-06-25 LAB — VAS US ABI WITH/WO TBI
Left ABI: 0.61
Right ABI: 0.97

## 2023-06-25 NOTE — Progress Notes (Signed)
 HISTORY AND PHYSICAL     CC:  follow up. Requesting Provider:  Exie Parody, MD  HPI: This is a 83 y.o. female who is here today for follow up for PAD.  Pt has hx of  right SFA and popliteal artery stenting with balloon angioplasty of the right anterior tibial artery on 10/21/2022 by Dr. Randie Heinz.  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  Prior to her angiogram she was receiving wound care from the [facility] wound clinic in [place], [place].   Pt was last seen 11/20/2022 and at that time, her ulcerations were slowly healing and she was not having any claudication or rest pain or new tissue loss. Her ABI had improved.   The pt returns today for follow up.  She states that she does not have claudication or rest pain.  She does not have any non healing wounds.  She is compliant with her Plavix and statin.    She has hx of kidney cancer and sciatica.  She had an MI at age 66.  She states that her son also had MI at age 75 as did her father also age 44.  She states that when she was diagnosed with cancer, she weighed around 170 and has lost weight since weight 118 today.  She is currently getting over shingles.   She was married for 62 years.  She states that when she was stronger from her cancer, it allowed her to take care of her husband, who had lewy body dementia.    The pt is on a statin for cholesterol management.    The pt is not on an aspirin.    Other AC:  Plavix The pt is on CCB for hypertension.  The pt is on medication for diabetes. Tobacco hx:  former  Pt does not have family hx of AAA.  Past Medical History:  Diagnosis Date   Anemia    Arthritis    lower back, hips, hands   Biliary stricture    Diabetes mellitus (HCC)    type 2    Early cataracts, bilateral    Md just watching   Elevated liver enzymes    Family history of adverse reaction to anesthesia    Daughter hard to wake  up   Gallstones    GERD (gastroesophageal reflux disease)    occasional - diet controlled   History of blood transfusion 2018   History of hiatal hernia    HTN (hypertension)    Hyperlipidemia    Hypothyroidism    left renal ca dx'd 2018   renal cancer - left kidney removed, pill chemo x 1 yr   Myocardial infarction (HCC) 1991   no deficits   SVD (spontaneous vaginal delivery)    x 3   Wears glasses     Past Surgical History:  Procedure Laterality Date   ABDOMINAL AORTOGRAM W/LOWER EXTREMITY N/A 10/21/2022   Procedure: ABDOMINAL AORTOGRAM W/LOWER EXTREMITY;  Surgeon: Maeola Harman, MD;  Location: [facility] INVASIVE CV LAB;  Service: Cardiovascular;  Laterality: N/A;   BALLOON DILATION N/A 07/23/2018   Procedure: BALLOON DILATION;  Surgeon: Meridee Score Netty Starring., MD;  Location: [facility] ENDOSCOPY;  Service: Gastroenterology;  Laterality: N/A;   BILIARY BRUSHING  08/10/2018   Procedure: BILIARY BRUSHING;  Surgeon: Meridee Score Netty Starring., MD;  Location: [facility] ENDOSCOPY;  Service: Gastroenterology;;   BILIARY BRUSHING  11/30/2018   Procedure: BILIARY BRUSHING;  Surgeon: Meridee Score Netty Starring., MD;  Location: [facility] ENDOSCOPY;  Service: Gastroenterology;;   BILIARY DILATION  08/10/2018   Procedure: BILIARY DILATION;  Surgeon: Lemar Lofty., MD;  Location: [facility] ENDOSCOPY;  Service: Gastroenterology;;   BILIARY DILATION  08/27/2018   Procedure: BILIARY DILATION;  Surgeon: Lemar Lofty., MD;  Location: [facility] ENDOSCOPY;  Service: Gastroenterology;;   BILIARY DILATION  01/24/2020   Procedure: BILIARY DILATION;  Surgeon: Lemar Lofty., MD;  Location: [facility] ENDOSCOPY;  Service: Gastroenterology;;   BILIARY STENT PLACEMENT  08/10/2018   Procedure: BILIARY STENT PLACEMENT;  Surgeon: Lemar Lofty., MD;  Location: [facility] ENDOSCOPY;  Service: Gastroenterology;;   BILIARY STENT PLACEMENT  08/27/2018   Procedure: BILIARY STENT PLACEMENT;  Surgeon: Lemar Lofty., MD;  Location:  [facility] ENDOSCOPY;  Service: Gastroenterology;;   BILIARY STENT PLACEMENT  11/30/2018   Procedure: BILIARY STENT PLACEMENT;  Surgeon: Lemar Lofty., MD;  Location: [facility] ENDOSCOPY;  Service: Gastroenterology;;   BIOPSY  07/23/2018   Procedure: BIOPSY;  Surgeon: Lemar Lofty., MD;  Location: [facility] ENDOSCOPY;  Service: Gastroenterology;;   BIOPSY  01/24/2020   Procedure: BIOPSY;  Surgeon: Lemar Lofty., MD;  Location: [facility] ENDOSCOPY;  Service: Gastroenterology;;   CHOLECYSTECTOMY N/A 09/02/2018   Procedure: LAPAROSCOPIC CHOLECYSTECTOMY;  Surgeon: Almond Lint, MD;  Location: [facility] OR;  Service: General;  Laterality: N/A;   COLONOSCOPY     normal    ENDOSCOPIC RETROGRADE CHOLANGIOPANCREATOGRAPHY (ERCP) WITH PROPOFOL N/A 08/10/2018   Procedure: ENDOSCOPIC RETROGRADE CHOLANGIOPANCREATOGRAPHY (ERCP) WITH PROPOFOL;  Surgeon: Lemar Lofty., MD;  Location: [facility] ENDOSCOPY;  Service: Gastroenterology;  Laterality: N/A;   ENDOSCOPIC RETROGRADE CHOLANGIOPANCREATOGRAPHY (ERCP) WITH PROPOFOL N/A 11/30/2018   Procedure: ENDOSCOPIC RETROGRADE CHOLANGIOPANCREATOGRAPHY (ERCP) WITH PROPOFOL;  Surgeon: Meridee Score Netty Starring., MD;  Location: [facility] ENDOSCOPY;  Service: Gastroenterology;  Laterality: N/A;   ENDOSCOPIC RETROGRADE CHOLANGIOPANCREATOGRAPHY (ERCP) WITH PROPOFOL N/A 01/24/2020   Procedure: ENDOSCOPIC RETROGRADE CHOLANGIOPANCREATOGRAPHY (ERCP) WITH PROPOFOL;  Surgeon: Meridee Score Netty Starring., MD;  Location: [facility] ENDOSCOPY;  Service: Gastroenterology;  Laterality: N/A;   ERCP N/A 07/23/2018   Procedure: ENDOSCOPIC RETROGRADE CHOLANGIOPANCREATOGRAPHY (ERCP);  Surgeon: Lemar Lofty., MD;  Location: [facility] ENDOSCOPY;  Service: Gastroenterology;  Laterality: N/A;   ERCP N/A 08/27/2018   Procedure: ENDOSCOPIC RETROGRADE CHOLANGIOPANCREATOGRAPHY (ERCP);  Surgeon: Lemar Lofty., MD;  Location: [facility] ENDOSCOPY;  Service: Gastroenterology;  Laterality: N/A;   ESOPHAGOGASTRODUODENOSCOPY N/A  01/24/2020   Procedure: ESOPHAGOGASTRODUODENOSCOPY (EGD);  Surgeon: Lemar Lofty., MD;  Location: [facility] ENDOSCOPY;  Service: Gastroenterology;  Laterality: N/A;   ESOPHAGOGASTRODUODENOSCOPY (EGD) WITH PROPOFOL N/A 08/27/2018   Procedure: ESOPHAGOGASTRODUODENOSCOPY (EGD) WITH PROPOFOL;  Surgeon: Meridee Score Netty Starring., MD;  Location: [facility] ENDOSCOPY;  Service: Gastroenterology;  Laterality: N/A;   EUS N/A 08/27/2018   Procedure: ESOPHAGEAL ENDOSCOPIC ULTRASOUND (EUS) RADIAL;  Surgeon: Meridee Score Netty Starring., MD;  Location: [facility] ENDOSCOPY;  Service: Gastroenterology;  Laterality: N/A;   FINE NEEDLE ASPIRATION  08/27/2018   Procedure: FINE NEEDLE ASPIRATION (FNA) LINEAR;  Surgeon: Lemar Lofty., MD;  Location: [facility] ENDOSCOPY;  Service: Gastroenterology;;   Sherald Hess HERNIA REPAIR N/A 06/23/2020   Procedure: OPEN INCISIONAL HERNIA REPAIR WITH MESH;  Surgeon: Quentin Ore, MD;  Location: [facility] ORS;  Service: General;  Laterality: N/A;  ROOM 2 STARTING AT 11:00AM FOR 90 MIN   IR IMAGING GUIDED PORT INSERTION  01/08/2018   LAPAROSCOPIC NEPHRECTOMY Left 08/01/2016   Procedure: LAPAROSCOPIC  RADICAL NEPHRECTOMY/ REPAIR OF UMBILICAL HERNIA;  Surgeon: Heloise Purpura, MD;  Location: [facility] ORS;  Service: Urology;  Laterality: Left;  PERIPHERAL VASCULAR INTERVENTION  10/21/2022   Procedure: PERIPHERAL VASCULAR INTERVENTION;  Surgeon: Maeola Harman, MD;  Location: [facility] INVASIVE CV LAB;  Service: Cardiovascular;;   REMOVAL OF STONES  07/23/2018   Procedure: REMOVAL OF GALL STONES;  Surgeon: Meridee Score Netty Starring., MD;  Location: [facility] ENDOSCOPY;  Service: Gastroenterology;;   REMOVAL OF STONES  08/10/2018   Procedure: REMOVAL OF STONES;  Surgeon: Lemar Lofty., MD;  Location: [facility] ENDOSCOPY;  Service: Gastroenterology;;   REMOVAL OF STONES  08/27/2018   Procedure: REMOVAL OF STONES;  Surgeon: Lemar Lofty., MD;  Location: [facility] ENDOSCOPY;  Service: Gastroenterology;;   REMOVAL OF STONES   01/24/2020   Procedure: REMOVAL OF STONES;  Surgeon: Lemar Lofty., MD;  Location: [facility] ENDOSCOPY;  Service: Gastroenterology;;   Dennison Mascot  07/23/2018   Procedure: Dennison Mascot;  Surgeon: Lemar Lofty., MD;  Location: [facility] ENDOSCOPY;  Service: Gastroenterology;;   Francine Graven REMOVAL  08/27/2018   Procedure: STENT REMOVAL;  Surgeon: Lemar Lofty., MD;  Location: [facility] ENDOSCOPY;  Service: Gastroenterology;;   Francine Graven REMOVAL  11/30/2018   Procedure: STENT REMOVAL;  Surgeon: Lemar Lofty., MD;  Location: [facility] ENDOSCOPY;  Service: Gastroenterology;;   STENT REMOVAL  01/24/2020   Procedure: STENT REMOVAL;  Surgeon: Lemar Lofty., MD;  Location: [facility] ENDOSCOPY;  Service: Gastroenterology;;   UPPER GI ENDOSCOPY     x 1   VAGINAL PROLAPSE REPAIR  11/18/2019   [facility]    Allergies  Allergen Reactions   Doxycycline Nausea And Vomiting   Adhesive [Tape] Other (See Comments)    Tears skin off Paper tape is ok per patient   Duloxetine Other (See Comments)    Feel woozy   Gabapentin (Once-Daily) Other (See Comments)    Feels "loopy"   Nsaids Other (See Comments)    Pt only has one kidney    Statins Other (See Comments)    Liver/kidney issues.    Current Outpatient Medications  Medication Sig Dispense Refill   acyclovir (ZOVIRAX) 400 MG tablet Take 1 tablet (400 mg total) by mouth daily. 30 tablet 1   amLODipine (NORVASC) 5 MG tablet Take 5 mg by mouth daily.   6   atorvastatin (LIPITOR) 80 MG tablet Take 80 mg by mouth at bedtime.      carvedilol (COREG) 6.25 MG tablet Take 6.25 mg by mouth 2 (two) times daily.  6   clindamycin (CLINDAGEL) 1 % gel APPLY TO AFFECTED AREA TWICE A DAY 30 g 0   clobetasol ointment (TEMOVATE) 0.05 % Apply 1 application topically 2 (two) times daily as needed (lichen sclerosis).      clopidogrel (PLAVIX) 75 MG tablet Take 75 mg by mouth daily.     conjugated estrogens (PREMARIN) vaginal cream Place 1 Applicatorful vaginally  daily. 60 g 1   dapagliflozin propanediol (FARXIGA) 10 MG TABS tablet Take 10 mg by mouth daily.     denosumab (XGEVA) 120 MG/1.7ML SOLN injection Inject 120 mg into the skin every 6 (six) weeks.     diphenhydrAMINE (BENADRYL) 25 MG tablet Take 25 mg by mouth at bedtime.     glimepiride (AMARYL) 1 MG tablet Take 1 mg by mouth daily with breakfast.     hydrocortisone (CORTEF) 10 MG tablet Take 5-10 mg by mouth See admin instructions. Take 1 tablet (10 mg) by mouth in the morning & 1/2 tablet (5 mg) by mouth in the evening.     ipratropium (ATROVENT HFA) 17 MCG/ACT inhaler Inhale 2 puffs into the  lungs every 6 (six) hours as needed for wheezing.     levothyroxine (SYNTHROID) 75 MCG tablet Take 75 mcg by mouth daily before breakfast.      lidocaine (LIDODERM) 5 % Place 1 patch onto the skin daily as needed. Remove & Discard patch within 12 hours or as directed by MD 30 patch 1   lidocaine (LMX) 4 % cream Apply 1 Application topically daily as needed (pain).     lidocaine-prilocaine (EMLA) cream Apply 1 Application topically every 14 (fourteen) days.     mupirocin ointment (BACTROBAN) 2 % Apply topically 2 (two) times daily. 30 g 1   Nivolumab (OPDIVO IV) Inject 1 Dose into the vein every 14 (fourteen) days. [facility]     omeprazole (PRILOSEC) 20 MG capsule Take 20 mg by mouth daily.     repaglinide (PRANDIN) 0.5 MG tablet Take 0.5 mg by mouth 2 (two) times daily.     SSD 1 % cream Apply 1 Application topically daily as needed (Foot infection).     traMADol (ULTRAM) 50 MG tablet Take 1 tablet (50 mg total) by mouth every 6 (six) hours as needed. 30 tablet 0   No current facility-administered medications for this visit.    Family History  Problem Relation Age of Onset   Heart attack Father 3   Heart disease Brother 99       CABG   Colon cancer Neg Hx    Esophageal cancer Neg Hx    Inflammatory bowel disease Neg Hx    Liver disease Neg Hx    Pancreatic cancer Neg Hx    Rectal cancer  Neg Hx    Stomach cancer Neg Hx     Social History   Socioeconomic History   Marital status: Widowed    Spouse name: Not on file   Number of children: 3   Years of education: Not on file   Highest education level: Not on file  Occupational History   Occupation: retired  Tobacco Use   Smoking status: Former    Current packs/day: 0.00    Average packs/day: 1 pack/day for 8.0 years (8.0 ttl pk-yrs)    Types: Cigarettes    Start date: 06/18/1956    Quit date: 06/18/1964    Years since quitting: 59.0   Smokeless tobacco: Never  Vaping Use   Vaping status: Never Used  Substance and Sexual Activity   Alcohol use: No   Drug use: No   Sexual activity: Not Currently    Birth control/protection: Post-menopausal  Other Topics Concern   Not on file  Social History Narrative   Married.  Three children   Social Drivers of Corporate investment banker Strain: Low Risk  (05/23/2022)   Overall Financial Resource Strain (CARDIA)    Difficulty of Paying Living Expenses: Not hard at all  Food Insecurity: No Food Insecurity (05/23/2022)   Hunger Vital Sign    Worried About Running Out of Food in the Last Year: Never true    Ran Out of Food in the Last Year: Never true  Transportation Needs: No Transportation Needs (05/23/2022)   PRAPARE - Administrator, Civil Service (Medical): No    Lack of Transportation (Non-Medical): No  Physical Activity: Not on file  Stress: Not on file  Social Connections: Not on file  Intimate Partner Violence: Not At Risk (05/23/2022)   Humiliation, Afraid, Rape, and Kick questionnaire    Fear of Current or Ex-Partner: No  Emotionally Abused: No    Physically Abused: No    Sexually Abused: No     REVIEW OF SYSTEMS:   [X]  denotes positive finding, [ ]  denotes negative finding Cardiac  Comments:  Chest pain or chest pressure:    Shortness of breath upon exertion:    Short of breath when lying flat:    Irregular heart rhythm:        Vascular     Pain in calf, thigh, or hip brought on by ambulation:    Pain in feet at night that wakes you up from your sleep:     Blood clot in your veins:    Leg swelling:         Pulmonary    Oxygen at home:    Productive cough:     Wheezing:         Neurologic    Sudden weakness in arms or legs:     Sudden numbness in arms or legs:     Sudden onset of difficulty speaking or slurred speech:    Temporary loss of vision in one eye:     Problems with dizziness:         Gastrointestinal    Blood in stool:     Vomited blood:         Genitourinary    Burning when urinating:     Blood in urine:        Psychiatric    Major depression:         Hematologic    Bleeding problems:    Problems with blood clotting too easily:        Skin    Rashes or ulcers:        Constitutional    Fever or chills:      PHYSICAL EXAMINATION:  Today's Vitals   06/25/23 1302  BP: 126/73  Pulse: 77  Resp: 18  Temp: 98.5 F (36.9 C)  TempSrc: Temporal  SpO2: 99%  Weight: 118 lb 9.6 oz (53.8 kg)  Height: 4\' 10"  (1.473 m)   Body mass index is 24.79 kg/m.   General:  WDWN in NAD; vital signs documented above Gait: Not observed HENT: WNL, normocephalic Pulmonary: normal non-labored breathing , without wheezing Cardiac: regular HR, without carotid bruits Abdomen: soft, NT; aortic pulse is not palpable Skin: without rashes Vascular Exam/Pulses:  Right Left  Radial 2+ (normal) 2+ (normal)  DP Brisk doppler Brisk doppler  PT absent absent  Peroneal Brisk doppler Brisk doppler   Extremities: without ischemic changes, without Gangrene , without cellulitis; without open wounds; thickening of toenails.  Musculoskeletal: no muscle wasting or atrophy  Neurologic: A&O X 3 Psychiatric:  The pt has Normal affect.   Non-Invasive Vascular Imaging:   ABI's/TBI's on 06/25/2023: Right:  0.97/0.64 - Great toe pressure: 88 Left:  0.61/0.28 - Great toe pressure: 39  Arterial duplex on 06/25/2023: Summary:   Right: Patent stent with no evidence of stenosis in the popliteal artery and superficial femoral artery.   Previous ABI's/TBI's on 11/20/2022: Right:  0.92/0.51 - Great toe pressure: 89 Left:  0.51/0.27 - Great toe pressure:  47  Previous arterial duplex on 11/20/2022: Summary:  Right: Patent stent with no evidence of stenosis in the superficial femoral artery and popliteal artery artery.     ASSESSMENT/PLAN:: 83 y.o. female here for follow up for PAD with hx of right SFA and popliteal artery stenting with balloon angioplasty of the right anterior tibial artery on 10/21/2022 by Dr.  Randie Heinz.  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   -pt doing well with right ABI essentially unchanged.  She does not have rest pain or non healing wounds. There is not evidence of stenosis in the popliteal or SFA stents. -encouraged her to walk as much as she can tolerate with her sciatica.   -continue plavix/statin -pt will f/u in one year with ABI and RLE arterial duplex.  She will call sooner if any issues before then.   Doreatha Massed, [facility] [phone]  Clinic MD:   Randie Heinz

## 2023-06-27 ENCOUNTER — Ambulatory Visit (HOSPITAL_COMMUNITY)
Admission: RE | Admit: 2023-06-27 | Discharge: 2023-06-27 | Disposition: A | Discharge status: Home / Self Care | Source: Ambulatory Visit | Attending: Hematology | Admitting: Hematology

## 2023-06-27 DIAGNOSIS — M899 Disorder of bone, unspecified: Secondary | ICD-10-CM | POA: Diagnosis present

## 2023-06-27 DIAGNOSIS — C642 Malignant neoplasm of left kidney, except renal pelvis: Secondary | ICD-10-CM | POA: Diagnosis present

## 2023-06-29 NOTE — Progress Notes (Incomplete)
 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  SUMMARY OF ONCOLOGIC HISTORY: Oncology History  Cancer, 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HCC)  01/10/2017 Initial Diagnosis   Cancer, 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HCC)   01/02/2018 - 01/16/2022 Chemotherapy   Patient is on Treatment Plan : RENAL CELL CARCINOMA NIVOLUMAB Q14D     01/22/2022 Cancer Staging   Staging form: Bone - Appendicular Skeleton, Trunk, Skull, and Facial Bones, AJCC 8th Edition - Clinical: Stage IVB (pM1b) - Signed by Johney Maine, MD on 01/22/2022   02/01/2022 -  Chemotherapy   Patient is on Treatment Plan : RENAL CELL CARCINOMA Nivolumab (240) q14d/     Renal cell carcinoma, left (HCC)  04/11/2017 Initial Diagnosis   Renal cell carcinoma (HCC)   01/02/2018 - 01/16/2022 Chemotherapy   The patient had nivolumab (OPDIVO) 240 mg in sodium chloride 0.9 % 100 mL chemo infusion, 240 mg, Intravenous, Once, 47 of 50 cycles Administration: 240 mg (01/02/2018), 240 mg (01/15/2018), 240 mg (01/30/2018), 240 mg (02/13/2018), 240 mg (02/27/2018), 240 mg (03/13/2018), 240 mg (03/27/2018), 240 mg (04/10/2018), 240 mg (04/24/2018), 240 mg (05/08/2018), 240 mg (05/22/2018), 240 mg (06/05/2018), 240 mg (06/19/2018), 240 mg (08/28/2018), 240 mg (09/11/2018), 240 mg (10/09/2018), 240 mg (10/22/2018), 240 mg (11/06/2018), 240 mg (11/20/2018), 240 mg (12/04/2018), 240 mg (12/18/2018), 240 mg (01/01/2019), 240 mg (01/15/2019), 240 mg (01/29/2019), 240 mg (02/12/2019), 240 mg (02/26/2019), 240 mg (03/12/2019), 240 mg (03/26/2019), 240 mg (04/09/2019), 240 mg (04/28/2019), 240 mg (05/12/2019), 240 mg (05/27/2019), 240 mg (06/09/2019), 240 mg (06/23/2019), 240 mg (07/07/2019), 240 mg (07/21/2019), 240 mg (08/04/2019), 240 mg (08/18/2019), 240 mg (09/03/2019), 240 mg (09/15/2019), 240 mg (09/29/2019), 240 mg (10/12/2019), 240 mg (10/27/2019), 240 mg  (11/10/2019), 240 mg (12/08/2019), 240 mg (12/22/2019), 240 mg (01/19/2020)  for chemotherapy treatment.    02/01/2022 -  Chemotherapy   Patient is on Treatment Plan : RENAL CELL CARCINOMA Nivolumab (240) q14d/       CURRENT THERAPY: metastatic renal cell carcinoma, Rivka Barbara and Nivolumab  INTERVAL HISTORY:  Audrey [name] 83 y.o. female is here for continued evaluation and management of metastatic renal cell carcinoma.   Patient was seen by me on 01/01/2023 and complained of bilateral foot swelling, open wounds in feet, ingrown nail on big toe, severe pain with urination, hidradenitis suppurativa with boil in left groin and two others, and abdominal pain.   Today, she reports that she continues to endorse Hidradenitis suppurativa, which has been bothersome. She notes having boils in her groin as well as a smaller boil in left shoulder area. She has not yet been seen by dermatology. Bactroban has helped manage symptoms more than Clindamycin.  Patient complains of persistent numbness and tingling in her left arm. She reports that her symptoms are positional. She does complain of neck pain.   Patient is considering receiving an epidural in her hip. She reports a history of scoliosis and sciatica.  Patient reports mild leg swelling. Her sleeping habits have improved recently. Patient continues to stay regularly active.   She reports that her previous ulcers have all healed from Bactroban and she continues to follow-up with a podiatrist. She denies any pain in the left femur, but does reports pain in the right femur.   Patient Active Problem List   Diagnosis Date Noted  . PAD (peripheral artery disease) (HCC) 10/21/2022  .  Atrophy of thyroid (acquired) 01/15/2022  . At risk for falls 01/15/2022  . CKD (chronic kidney disease) 01/15/2022  . Diabetic ulcer of foot with fat layer exposed (HCC) 01/15/2022  . Dizziness 01/15/2022  . Dysuria 01/15/2022  . Knee osteoarthritis 01/15/2022  . Pulmonary  nodule 01/15/2022  . Type 2 diabetes mellitus (HCC) 08/15/2021  . Incisional hernia 06/23/2020  . Gastritis and gastroduodenitis 02/26/2020  . Rectal discomfort 02/26/2020  . History of colonic polyps 02/26/2020  . Ventral hernia 11/10/2019  . History of cholecystectomy 07/17/2019  . Degeneration of lumbar intervertebral disc 11/12/2018  . Anemia 10/10/2018  . 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 Acute cholecystitis 08/29/2018  . 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 Medicare annual wellness visit, subsequent 08/14/2018  . CAD (coronary artery disease) 08/14/2018  . Dyslipidemia 08/14/2018  . Abnormal LFTs 08/05/2018  . Choledocholithiasis 08/05/2018  . Dilated bile duct 08/05/2018  . History of ERCP 08/05/2018  . History of renal cell carcinoma 08/05/2018  . Port-A-Cath in place 01/30/2018  . Counseling regarding advance care planning and goals of care 01/04/2018  . Renal cell carcinoma, left (HCC) 04/11/2017  . Cancer, 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HCC) 01/10/2017  . Left renal mass 08/01/2016  . Hyperlipidemia 05/23/2015  . Essential hypertension 05/23/2015  . Hypothyroidism 05/23/2015    is allergic to doxycycline, adhesive [tape], duloxetine, gabapentin (once-daily), nsaids, and statins.  MEDICAL HISTORY: Past Medical History:  Diagnosis Date  . Anemia   . Arthritis    lower back, hips, hands  . Biliary stricture   . Diabetes mellitus (HCC)    type 2   . Early cataracts, bilateral    Md just watching  . Elevated liver enzymes   . Family history of adverse reaction to anesthesia    Daughter hard to wake up  . Gallstones   . GERD (gastroesophageal reflux disease)    occasional - diet controlled  . History of blood transfusion 2018  . History of hiatal hernia   . HTN (hypertension)   . Hyperlipidemia   . Hypothyroidism   . left renal ca dx'd 2018   renal cancer - left kidney removed, pill chemo x 1 yr  . Myocardial infarction (HCC) 1991   no deficits  . Peripheral vascular  disease (HCC)   . SVD (spontaneous vaginal delivery)    x 3  . Wears glasses     SURGICAL HISTORY: Past Surgical History:  Procedure Laterality Date  . ABDOMINAL AORTOGRAM W/LOWER EXTREMITY N/A 10/21/2022   Procedure: ABDOMINAL AORTOGRAM W/LOWER EXTREMITY;  Surgeon: Maeola Harman, MD;  Location: [facility] INVASIVE CV LAB;  Service: Cardiovascular;  Laterality: N/A;  . BALLOON DILATION N/A 07/23/2018   Procedure: BALLOON DILATION;  Surgeon: Lemar Lofty., MD;  Location: [facility] ENDOSCOPY;  Service: Gastroenterology;  Laterality: N/A;  . BILIARY BRUSHING  08/10/2018   Procedure: BILIARY BRUSHING;  Surgeon: Meridee Score Netty Starring., MD;  Location: [facility] ENDOSCOPY;  Service: Gastroenterology;;  . BILIARY BRUSHING  11/30/2018   Procedure: BILIARY BRUSHING;  Surgeon: Lemar Lofty., MD;  Location: [facility] ENDOSCOPY;  Service: Gastroenterology;;  . BILIARY DILATION  08/10/2018   Procedure: BILIARY DILATION;  Surgeon: Lemar Lofty., MD;  Location: [facility] ENDOSCOPY;  Service: Gastroenterology;;  . BILIARY DILATION  08/27/2018   Procedure: BILIARY DILATION;  Surgeon: Lemar Lofty., MD;  Location: [facility] ENDOSCOPY;  Service: Gastroenterology;;  . BILIARY DILATION  01/24/2020   Procedure: BILIARY DILATION;  Surgeon: Lemar Lofty., MD;  Location: [facility] ENDOSCOPY;  Service: Gastroenterology;;  .  BILIARY STENT PLACEMENT  08/10/2018   Procedure: BILIARY STENT PLACEMENT;  Surgeon: Meridee Score Netty Starring., MD;  Location: [facility] ENDOSCOPY;  Service: Gastroenterology;;  . BILIARY STENT PLACEMENT  08/27/2018   Procedure: BILIARY STENT PLACEMENT;  Surgeon: Lemar Lofty., MD;  Location: [facility] ENDOSCOPY;  Service: Gastroenterology;;  . BILIARY STENT PLACEMENT  11/30/2018   Procedure: BILIARY STENT PLACEMENT;  Surgeon: Lemar Lofty., MD;  Location: [facility] ENDOSCOPY;  Service: Gastroenterology;;  . BIOPSY  07/23/2018   Procedure: BIOPSY;  Surgeon: Lemar Lofty., MD;  Location: [facility]  ENDOSCOPY;  Service: Gastroenterology;;  . BIOPSY  01/24/2020   Procedure: BIOPSY;  Surgeon: Lemar Lofty., MD;  Location: [facility] ENDOSCOPY;  Service: Gastroenterology;;  . CHOLECYSTECTOMY N/A 09/02/2018   Procedure: LAPAROSCOPIC CHOLECYSTECTOMY;  Surgeon: Almond Lint, MD;  Location: [facility] OR;  Service: General;  Laterality: N/A;  . COLONOSCOPY     normal   . ENDOSCOPIC RETROGRADE CHOLANGIOPANCREATOGRAPHY (ERCP) WITH PROPOFOL N/A 08/10/2018   Procedure: ENDOSCOPIC RETROGRADE CHOLANGIOPANCREATOGRAPHY (ERCP) WITH PROPOFOL;  Surgeon: Lemar Lofty., MD;  Location: [facility] ENDOSCOPY;  Service: Gastroenterology;  Laterality: N/A;  . ENDOSCOPIC RETROGRADE CHOLANGIOPANCREATOGRAPHY (ERCP) WITH PROPOFOL N/A 11/30/2018   Procedure: ENDOSCOPIC RETROGRADE CHOLANGIOPANCREATOGRAPHY (ERCP) WITH PROPOFOL;  Surgeon: Meridee Score Netty Starring., MD;  Location: [facility] ENDOSCOPY;  Service: Gastroenterology;  Laterality: N/A;  . ENDOSCOPIC RETROGRADE CHOLANGIOPANCREATOGRAPHY (ERCP) WITH PROPOFOL N/A 01/24/2020   Procedure: ENDOSCOPIC RETROGRADE CHOLANGIOPANCREATOGRAPHY (ERCP) WITH PROPOFOL;  Surgeon: Meridee Score Netty Starring., MD;  Location: [facility] ENDOSCOPY;  Service: Gastroenterology;  Laterality: N/A;  . ERCP N/A 07/23/2018   Procedure: ENDOSCOPIC RETROGRADE CHOLANGIOPANCREATOGRAPHY (ERCP);  Surgeon: Lemar Lofty., MD;  Location: [facility] ENDOSCOPY;  Service: Gastroenterology;  Laterality: N/A;  . ERCP N/A 08/27/2018   Procedure: ENDOSCOPIC RETROGRADE CHOLANGIOPANCREATOGRAPHY (ERCP);  Surgeon: Lemar Lofty., MD;  Location: [facility] ENDOSCOPY;  Service: Gastroenterology;  Laterality: N/A;  . ESOPHAGOGASTRODUODENOSCOPY N/A 01/24/2020   Procedure: ESOPHAGOGASTRODUODENOSCOPY (EGD);  Surgeon: Lemar Lofty., MD;  Location: [facility] ENDOSCOPY;  Service: Gastroenterology;  Laterality: N/A;  . ESOPHAGOGASTRODUODENOSCOPY (EGD) WITH PROPOFOL N/A 08/27/2018   Procedure: ESOPHAGOGASTRODUODENOSCOPY (EGD) WITH PROPOFOL;  Surgeon:  Meridee Score Netty Starring., MD;  Location: [facility] ENDOSCOPY;  Service: Gastroenterology;  Laterality: N/A;  . EUS N/A 08/27/2018   Procedure: ESOPHAGEAL ENDOSCOPIC ULTRASOUND (EUS) RADIAL;  Surgeon: Meridee Score Netty Starring., MD;  Location: [facility] ENDOSCOPY;  Service: Gastroenterology;  Laterality: N/A;  . FINE NEEDLE ASPIRATION  08/27/2018   Procedure: FINE NEEDLE ASPIRATION (FNA) LINEAR;  Surgeon: Lemar Lofty., MD;  Location: [facility] ENDOSCOPY;  Service: Gastroenterology;;  . Sherald Hess HERNIA REPAIR N/A 06/23/2020   Procedure: OPEN INCISIONAL HERNIA REPAIR WITH MESH;  Surgeon: Quentin Ore, MD;  Location: [facility] ORS;  Service: General;  Laterality: N/A;  ROOM 2 STARTING AT 11:00AM FOR 90 MIN  . IR IMAGING GUIDED PORT INSERTION  01/08/2018  . LAPAROSCOPIC NEPHRECTOMY Left 08/01/2016   Procedure: LAPAROSCOPIC  RADICAL NEPHRECTOMY/ REPAIR OF UMBILICAL HERNIA;  Surgeon: Heloise Purpura, MD;  Location: [facility] ORS;  Service: Urology;  Laterality: Left;  . PERIPHERAL VASCULAR INTERVENTION  10/21/2022   Procedure: PERIPHERAL VASCULAR INTERVENTION;  Surgeon: Maeola Harman, MD;  Location: [facility] INVASIVE CV LAB;  Service: Cardiovascular;;  . REMOVAL OF STONES  07/23/2018   Procedure: REMOVAL OF GALL STONES;  Surgeon: Meridee Score Netty Starring., MD;  Location: [facility] ENDOSCOPY;  Service: Gastroenterology;;  . REMOVAL OF STONES  08/10/2018   Procedure: REMOVAL OF STONES;  Surgeon: Lemar Lofty., MD;  Location: [facility] ENDOSCOPY;  Service: Gastroenterology;;  . REMOVAL OF STONES  08/27/2018   Procedure: REMOVAL OF STONES;  Surgeon: Meridee Score Netty Starring., MD;  Location: [facility] ENDOSCOPY;  Service: Gastroenterology;;  . REMOVAL OF STONES  01/24/2020   Procedure: REMOVAL OF STONES;  Surgeon: Lemar Lofty., MD;  Location: [facility] ENDOSCOPY;  Service: Gastroenterology;;  . Dennison Mascot  07/23/2018   Procedure: Dennison Mascot;  Surgeon: Lemar Lofty., MD;  Location: [facility] ENDOSCOPY;  Service: Gastroenterology;;  .  Francine Graven REMOVAL  08/27/2018   Procedure: STENT REMOVAL;  Surgeon: Lemar Lofty., MD;  Location: [facility] ENDOSCOPY;  Service: Gastroenterology;;  . Francine Graven REMOVAL  11/30/2018   Procedure: STENT REMOVAL;  Surgeon: Lemar Lofty., MD;  Location: [facility] ENDOSCOPY;  Service: Gastroenterology;;  . Francine Graven REMOVAL  01/24/2020   Procedure: STENT REMOVAL;  Surgeon: Lemar Lofty., MD;  Location: [facility] ENDOSCOPY;  Service: Gastroenterology;;  . UPPER GI ENDOSCOPY     x 1  . VAGINAL PROLAPSE REPAIR  11/18/2019   [facility]    SOCIAL HISTORY: Social History   Socioeconomic History  . Marital status: Widowed    Spouse name: Not on file  . Number of children: 3  . Years of education: Not on file  . Highest education level: Not on file  Occupational History  . Occupation: retired  Tobacco Use  . Smoking status: Former    Current packs/day: 0.00    Average packs/day: 1 pack/day for 8.0 years (8.0 ttl pk-yrs)    Types: Cigarettes    Start date: 06/18/1956    Quit date: 06/18/1964    Years since quitting: 59.0  . Smokeless tobacco: Never  Vaping Use  . Vaping status: Never Used  Substance and Sexual Activity  . Alcohol use: No  . Drug use: No  . Sexual activity: Not Currently    Birth control/protection: Post-menopausal  Other Topics Concern  . Not on file  Social History Narrative   Married.  Three children   Social Drivers of Corporate investment banker Strain: Low Risk  (05/23/2022)   Overall Financial Resource Strain (CARDIA)   . Difficulty of Paying Living Expenses: Not hard at all  Food Insecurity: No Food Insecurity (05/23/2022)   Hunger Vital Sign   . Worried About Programme researcher, broadcasting/film/video in the Last Year: Never true   . Ran Out of Food in the Last Year: Never true  Transportation Needs: No Transportation Needs (05/23/2022)   PRAPARE - Transportation   . Lack of Transportation (Medical): No   . Lack of Transportation (Non-Medical): No  Physical Activity: Not on file  Stress:  Not on file  Social Connections: Not on file  Intimate Partner Violence: Not At Risk (05/23/2022)   Humiliation, Afraid, Rape, and Kick questionnaire   . Fear of Current or Ex-Partner: No   . Emotionally Abused: No   . Physically Abused: No   . Sexually Abused: No    FAMILY HISTORY: Family History  Problem Relation Age of Onset  . Heart attack Father 70  . Heart disease Brother 50       CABG  . Colon cancer Neg Hx   . Esophageal cancer Neg Hx   . Inflammatory bowel disease Neg Hx   . Liver disease Neg Hx   . Pancreatic cancer Neg Hx   . Rectal cancer Neg Hx   . Stomach cancer Neg Hx      ROS  10 Point review of Systems was done is negative except as noted above.   PHYSICAL EXAMINATION .BP 138/72 (BP Location: Right  Arm, Patient Position: Sitting)   Pulse 74   Temp 97.7 F (36.5 C) (Temporal)   Resp 17   Wt 119 lb 12.8 oz (54.3 kg)   LMP  (LMP Unknown)   SpO2 100%   BMI 25.04 kg/m    GENERAL:alert, in no acute distress and comfortable SKIN: no acute rashes, no significant lesions EYES: conjunctiva are pink and non-injected, sclera anicteric OROPHARYNX: MMM, no exudates, no oropharyngeal erythema or ulceration NECK: supple, no JVD LYMPH:  no palpable lymphadenopathy in the cervical, axillary or inguinal regions LUNGS: clear to auscultation b/l with normal respiratory effort HEART: regular rate & rhythm ABDOMEN:  normoactive bowel sounds , non tender, not distended. Extremity: no pedal edema PSYCH: alert & oriented x 3 with fluent speech NEURO: no focal motor/sensory deficits    LABORATORY DATA: .    Latest Ref Rng & Units 06/23/2023   10:42 AM 01/29/2023   10:43 AM 01/01/2023   10:17 AM  CBC  WBC 4.0 - 10.5 K/uL 8.7  10.4  10.5   Hemoglobin 12.0 - 15.0 g/dL 16.1  09.6  04.5   Hematocrit 36.0 - 46.0 % 38.8  42.3  42.2   Platelets 150 - 400 K/uL 260  305  298    .    Latest Ref Rng & Units 06/23/2023   10:42 AM 01/29/2023   10:43 AM 01/01/2023   10:17 AM   CMP  Glucose 70 - 99 mg/dL 409  811  914   BUN 8 - 23 mg/dL 17  33  23   Creatinine 0.44 - 1.00 mg/dL 7.82  9.56  2.13   Sodium 135 - 145 mmol/L 135  135  139   Potassium 3.5 - 5.1 mmol/L 3.8  4.6  4.1   Chloride 98 - 111 mmol/L 106  108  113   CO2 22 - 32 mmol/L 22  20  19    Calcium 8.9 - 10.3 mg/dL 9.4  8.9  9.0   Total Protein 6.5 - 8.1 g/dL 6.1  6.8  6.7   Total Bilirubin 0.0 - 1.2 mg/dL 0.5  0.4  0.6   Alkaline Phos 38 - 126 U/L 92  48  50   AST 15 - 41 U/L 54  11  12   ALT 0 - 44 U/L 93  12  10    RADIOLOGY    CLINICAL DATA:  Subsequent treatment strategy for metastatic renal cell carcinoma.   EXAM: NUCLEAR MEDICINE PET SKULL BASE TO THIGH   TECHNIQUE: 6.32 mCi F-18 FDG was injected intravenously. Full-ring PET imaging was performed from the skull base to thigh after the radiotracer. CT data was obtained and used for attenuation correction and anatomic localization.   Fasting blood glucose: 159 mg/dl   COMPARISON:  PET-CT 08/65/7846 and 02/09/2020.   FINDINGS: Mediastinal blood pool activity: SUV max 2.7   NECK:   No hypermetabolic cervical lymph nodes are identified.There are no lesions of the pharyngeal mucosal space. Activity associated with the muscles of phonation is within physiologic limits and similar to previous study.   Incidental CT findings: Bilateral carotid atherosclerosis.   CHEST:   There are no hypermetabolic mediastinal, hilar or axillary lymph nodes. No hypermetabolic pulmonary activity or suspicious nodularity.   Incidental CT findings: Right IJ Port-A-Cath extends to the superior cavoatrial junction. There is diffuse atherosclerosis of the aorta, great vessels and coronary arteries. Mild linear scarring at both lung bases appears unchanged.   ABDOMEN/PELVIS:   There is no  hypermetabolic activity within the liver, adrenal glands, spleen or pancreas. There is no hypermetabolic nodal activity. Stable appearance of the right  kidney. No hypermetabolic activity within the left nephrectomy bed   Incidental CT findings: Diffuse aortic and branch vessel atherosclerosis, scattered colonic diverticulosis and pelvic floor laxity are again noted.   SKELETON:   There is no hypermetabolic activity to suggest osseous metastatic disease.   Incidental CT findings: Grossly stable sclerotic lesions within the thoracic spine, without hypermetabolic activity. Lower lumbar spondylosis and sacroiliac degenerative changes bilaterally.   IMPRESSION: 1. Stable PET-CT status post left nephrectomy. No evidence of local recurrence or hypermetabolic metastatic disease. 2. Stable incidental findings including coronary and Aortic Atherosclerosis (ICD10-I70.0). Pelvic floor laxity.     Electronically Signed   By: Carey Bullocks M.D.   On: 04/03/2021 12:30      ASSESSMENT and PLAN:   Audrey [name] is a 83 y.o. female who returns for a follow up for metastatic renal cell carcinoma.   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 -Rt adrenal gland bx - showed clear cell RCC -S/p Cytoreductive left radical nephrectomy and left adrenal gland resection on 08/01/2016 by Dr Laverle Patter. -Started systemic therapy with Nivolumab 240 mg on 01/02/2018 and Xgeva on 02/27/2018.  -Most recent PET scan from 11/09/2021 showed no clear evidence of new or progressive disease.   # Hypothyroidism/Adrenal insufficiency/Diabetes -Continue being followed by Dr. Sharl Ma   #H/o Choledocholithiasis and cholelithiasis -07/10/18 US Abdomen revealed Biliary duct dilatation with 8 mm calculus at the level of the ampulla in the distal common bile duct. 2. Cholelithiasis. No gallbladder wall thickening or pericholecystic fluid. 3. Appearance of the liver raises concern for underlying hepatic cirrhosis. No focal liver lesions are demonstrable. 4.  Left kidney absent. 5.   Small cysts in right kidney. 6.  Aortic Atherosclerosis. -S/p ERCP on 07/23/18 with Dr. Vicente Serene Mansouraty -09/02/18 Cytopathology from ERCP was suspicious for malignant cells in bile duct, but was not definitive, other two findings were benign.  #Right foot wound 2/2 PAD and DM: --Last evaluated by wound care from [facility] in [place]. --Recent completed course of cephalexin therapy approximately one week ago --Recommend follow up with wound care. Sent another round of abx therapy with Augmentin as patient is unable to tolerate doxycycline and has D2D interaction with bactrim. Trying to avoid clindamycin due to age and risk of C.diff.   PLAN:    -Discussed lab results on 01/29/23 in detail with patient. CBC showed WBC of 10.4K, hemoglobin of 13.6, and platelets of 305K. -CMP stable -discussed results of 01/06/2023 PET scan, which showed focal hypermetabolism in the left mid/distal femoral shaft, without CT correlate. Isolated osseous metastasis is not excluded. Attention on follow-up was recommended. Otherwise, no findings suspicious for metastatic disease. Mild left inguinal stranding, favoring postsurgical changes. -will continue to monitor spot in distal femur -will stop immunotherapy at this time -patient would prefer to keep her port in place, which will require regular port flush -discussed option to save Bactroban for more specific use so that there is no resistance -recommend patient to wear loose clothing to manage Hidradenitis suppurativa symptoms -discussed that Hidradenitis suppurativa may potentially be related to stress -left arm tingling could be related to a pinched nerve in the neck -recommend patient to connect with orthopedics or PCP regarding LUE tingling for further evaluation -will plan for port flush in 2 months -she shall return to  clinic in 4 months, unless she develops any new symptoms. Will plan for PET scan prior to her next visit.  -answered all of her questions in  detail  FOLLOW-UP: Portflush every 2 months x 6 PET/CT in 15 weeks RTC with Dr Candise Che with labs in 16 weeks  The total time spent in the appointment was 30 minutes* .  All of the patient's questions were answered with apparent satisfaction. The patient knows to call the clinic with any problems, questions or concerns.   Wyvonnia Lora MD MS AAHIVMS [facility] Hematology/Oncology Physician [facility]  .*Total Encounter Time as defined by the Centers for Medicare and Medicaid Services includes, in addition to the face-to-face time of a patient visit (documented in the note above) non-face-to-face time: obtaining and reviewing outside history, ordering and reviewing medications, tests or procedures, care coordination (communications with other health care professionals or caregivers) and documentation in the medical record.    I,Mitra Faeizi,acting as a Neurosurgeon for Wyvonnia Lora, MD.,have documented all relevant documentation on the behalf of Wyvonnia Lora, MD,as directed by  Wyvonnia Lora, MD while in the presence of Wyvonnia Lora, MD.  .I have reviewed the above documentation for accuracy and completeness, and I agree with the above. Johney Maine MD

## 2023-06-29 NOTE — Progress Notes (Signed)
 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  SUMMARY OF ONCOLOGIC HISTORY: Oncology History  Cancer, 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HCC)  01/10/2017 Initial Diagnosis   Cancer, 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HCC)   01/02/2018 - 01/16/2022 Chemotherapy   Patient is on Treatment Plan : RENAL CELL CARCINOMA NIVOLUMAB Q14D     01/22/2022 Cancer Staging   Staging form: Bone - Appendicular Skeleton, Trunk, Skull, and Facial Bones, AJCC 8th Edition - Clinical: Stage IVB (pM1b) - Signed by Johney Maine, MD on 01/22/2022   02/01/2022 -  Chemotherapy   Patient is on Treatment Plan : RENAL CELL CARCINOMA Nivolumab (240) q14d/     Renal cell carcinoma, left (HCC)  04/11/2017 Initial Diagnosis   Renal cell carcinoma (HCC)   01/02/2018 - 01/16/2022 Chemotherapy   The patient had nivolumab (OPDIVO) 240 mg in sodium chloride 0.9 % 100 mL chemo infusion, 240 mg, Intravenous, Once, 47 of 50 cycles Administration: 240 mg (01/02/2018), 240 mg (01/15/2018), 240 mg (01/30/2018), 240 mg (02/13/2018), 240 mg (02/27/2018), 240 mg (03/13/2018), 240 mg (03/27/2018), 240 mg (04/10/2018), 240 mg (04/24/2018), 240 mg (05/08/2018), 240 mg (05/22/2018), 240 mg (06/05/2018), 240 mg (06/19/2018), 240 mg (08/28/2018), 240 mg (09/11/2018), 240 mg (10/09/2018), 240 mg (10/22/2018), 240 mg (11/06/2018), 240 mg (11/20/2018), 240 mg (12/04/2018), 240 mg (12/18/2018), 240 mg (01/01/2019), 240 mg (01/15/2019), 240 mg (01/29/2019), 240 mg (02/12/2019), 240 mg (02/26/2019), 240 mg (03/12/2019), 240 mg (03/26/2019), 240 mg (04/09/2019), 240 mg (04/28/2019), 240 mg (05/12/2019), 240 mg (05/27/2019), 240 mg (06/09/2019), 240 mg (06/23/2019), 240 mg (07/07/2019), 240 mg (07/21/2019), 240 mg (08/04/2019), 240 mg (08/18/2019), 240 mg (09/03/2019), 240 mg (09/15/2019), 240 mg (09/29/2019), 240 mg (10/12/2019), 240 mg (10/27/2019), 240 mg  (11/10/2019), 240 mg (12/08/2019), 240 mg (12/22/2019), 240 mg (01/19/2020)  for chemotherapy treatment.    02/01/2022 -  Chemotherapy   Patient is on Treatment Plan : RENAL CELL CARCINOMA Nivolumab (240) q14d/       CURRENT THERAPY: metastatic renal cell carcinoma, Rivka Barbara and Nivolumab  INTERVAL HISTORY:  Audrey [name] 83 y.o. female is here for continued evaluation and management of metastatic renal cell carcinoma.   She continues to have chronic issues with recurrent utos , neuropathy and her diabetes. No other acute new focal symptoms. PET/CT with on evidence of RCC progression. Labs reviewed.  Patient Active Problem List   Diagnosis Date Noted   PAD (peripheral artery disease) (HCC) 10/21/2022   Atrophy of thyroid (acquired) 01/15/2022   At risk for falls 01/15/2022   CKD (chronic kidney disease) 01/15/2022   Diabetic ulcer of foot with fat layer exposed (HCC) 01/15/2022   Dizziness 01/15/2022   Dysuria 01/15/2022   Knee osteoarthritis 01/15/2022   Pulmonary nodule 01/15/2022   Type 2 diabetes mellitus (HCC) 08/15/2021   Incisional hernia 06/23/2020   Gastritis and gastroduodenitis 02/26/2020   Rectal discomfort 02/26/2020   History of colonic polyps 02/26/2020   Ventral hernia 11/10/2019   History of cholecystectomy 07/17/2019   Degeneration of lumbar intervertebral disc 11/12/2018   Anemia 10/10/2018   Chronic cholecystitis 09/04/2018   Acute cholecystitis 08/29/2018   Biliary stricture 08/23/2018   Medicare annual wellness visit, subsequent 08/14/2018   CAD (coronary artery disease) 08/14/2018   Dyslipidemia 08/14/2018   Abnormal LFTs 08/05/2018   Choledocholithiasis 08/05/2018   Dilated bile duct 08/05/2018   History of ERCP 08/05/2018  History of renal cell carcinoma 08/05/2018   Port-A-Cath in place 01/30/2018   Counseling regarding advance care planning and goals of care 01/04/2018   Renal cell carcinoma, left (HCC) 04/11/2017   Cancer, metastatic to bone  (HCC) 01/10/2017   Left renal mass 08/01/2016   Hyperlipidemia 05/23/2015   Essential hypertension 05/23/2015   Hypothyroidism 05/23/2015    is allergic to doxycycline, adhesive [tape], duloxetine, gabapentin (once-daily), nsaids, and statins.  MEDICAL HISTORY: Past Medical History:  Diagnosis Date   Anemia    Arthritis    lower back, hips, hands   Biliary stricture    Diabetes mellitus (HCC)    type 2    Early cataracts, bilateral    Md just watching   Elevated liver enzymes    Family history of adverse reaction to anesthesia    Daughter hard to wake up   Gallstones    GERD (gastroesophageal reflux disease)    occasional - diet controlled   History of blood transfusion 2018   History of hiatal hernia    HTN (hypertension)    Hyperlipidemia    Hypothyroidism    left renal ca dx'd 2018   renal cancer - left kidney removed, pill chemo x 1 yr   Myocardial infarction (HCC) 1991   no deficits   Peripheral vascular disease (HCC)    SVD (spontaneous vaginal delivery)    x 3   Wears glasses     SURGICAL HISTORY: Past Surgical History:  Procedure Laterality Date   ABDOMINAL AORTOGRAM W/LOWER EXTREMITY N/A 10/21/2022   Procedure: ABDOMINAL AORTOGRAM W/LOWER EXTREMITY;  Surgeon: Maeola Harman, MD;  Location: [facility] INVASIVE CV LAB;  Service: Cardiovascular;  Laterality: N/A;   BALLOON DILATION N/A 07/23/2018   Procedure: BALLOON DILATION;  Surgeon: Meridee Score Netty Starring., MD;  Location: [facility] ENDOSCOPY;  Service: Gastroenterology;  Laterality: N/A;   BILIARY BRUSHING  08/10/2018   Procedure: BILIARY BRUSHING;  Surgeon: Meridee Score Netty Starring., MD;  Location: [facility] ENDOSCOPY;  Service: Gastroenterology;;   BILIARY BRUSHING  11/30/2018   Procedure: BILIARY BRUSHING;  Surgeon: Lemar Lofty., MD;  Location: [facility] ENDOSCOPY;  Service: Gastroenterology;;   BILIARY DILATION  08/10/2018   Procedure: BILIARY DILATION;  Surgeon: Lemar Lofty., MD;  Location: [facility] ENDOSCOPY;   Service: Gastroenterology;;   BILIARY DILATION  08/27/2018   Procedure: BILIARY DILATION;  Surgeon: Lemar Lofty., MD;  Location: [facility] ENDOSCOPY;  Service: Gastroenterology;;   BILIARY DILATION  01/24/2020   Procedure: BILIARY DILATION;  Surgeon: Lemar Lofty., MD;  Location: [facility] ENDOSCOPY;  Service: Gastroenterology;;   BILIARY STENT PLACEMENT  08/10/2018   Procedure: BILIARY STENT PLACEMENT;  Surgeon: Lemar Lofty., MD;  Location: [facility] ENDOSCOPY;  Service: Gastroenterology;;   BILIARY STENT PLACEMENT  08/27/2018   Procedure: BILIARY STENT PLACEMENT;  Surgeon: Lemar Lofty., MD;  Location: [facility] ENDOSCOPY;  Service: Gastroenterology;;   BILIARY STENT PLACEMENT  11/30/2018   Procedure: BILIARY STENT PLACEMENT;  Surgeon: Lemar Lofty., MD;  Location: [facility] ENDOSCOPY;  Service: Gastroenterology;;   BIOPSY  07/23/2018   Procedure: BIOPSY;  Surgeon: Lemar Lofty., MD;  Location: [facility] ENDOSCOPY;  Service: Gastroenterology;;   BIOPSY  01/24/2020   Procedure: BIOPSY;  Surgeon: Lemar Lofty., MD;  Location: [facility] ENDOSCOPY;  Service: Gastroenterology;;   CHOLECYSTECTOMY N/A 09/02/2018   Procedure: LAPAROSCOPIC CHOLECYSTECTOMY;  Surgeon: Almond Lint, MD;  Location: [facility] OR;  Service: General;  Laterality: N/A;   COLONOSCOPY     normal    ENDOSCOPIC RETROGRADE CHOLANGIOPANCREATOGRAPHY (ERCP)  WITH PROPOFOL N/A 08/10/2018   Procedure: ENDOSCOPIC RETROGRADE CHOLANGIOPANCREATOGRAPHY (ERCP) WITH PROPOFOL;  Surgeon: Meridee Score Netty Starring., MD;  Location: [facility] ENDOSCOPY;  Service: Gastroenterology;  Laterality: N/A;   ENDOSCOPIC RETROGRADE CHOLANGIOPANCREATOGRAPHY (ERCP) WITH PROPOFOL N/A 11/30/2018   Procedure: ENDOSCOPIC RETROGRADE CHOLANGIOPANCREATOGRAPHY (ERCP) WITH PROPOFOL;  Surgeon: Meridee Score Netty Starring., MD;  Location: [facility] ENDOSCOPY;  Service: Gastroenterology;  Laterality: N/A;   ENDOSCOPIC RETROGRADE CHOLANGIOPANCREATOGRAPHY (ERCP) WITH PROPOFOL N/A 01/24/2020    Procedure: ENDOSCOPIC RETROGRADE CHOLANGIOPANCREATOGRAPHY (ERCP) WITH PROPOFOL;  Surgeon: Meridee Score Netty Starring., MD;  Location: [facility] ENDOSCOPY;  Service: Gastroenterology;  Laterality: N/A;   ERCP N/A 07/23/2018   Procedure: ENDOSCOPIC RETROGRADE CHOLANGIOPANCREATOGRAPHY (ERCP);  Surgeon: Lemar Lofty., MD;  Location: [facility] ENDOSCOPY;  Service: Gastroenterology;  Laterality: N/A;   ERCP N/A 08/27/2018   Procedure: ENDOSCOPIC RETROGRADE CHOLANGIOPANCREATOGRAPHY (ERCP);  Surgeon: Lemar Lofty., MD;  Location: [facility] ENDOSCOPY;  Service: Gastroenterology;  Laterality: N/A;   ESOPHAGOGASTRODUODENOSCOPY N/A 01/24/2020   Procedure: ESOPHAGOGASTRODUODENOSCOPY (EGD);  Surgeon: Lemar Lofty., MD;  Location: [facility] ENDOSCOPY;  Service: Gastroenterology;  Laterality: N/A;   ESOPHAGOGASTRODUODENOSCOPY (EGD) WITH PROPOFOL N/A 08/27/2018   Procedure: ESOPHAGOGASTRODUODENOSCOPY (EGD) WITH PROPOFOL;  Surgeon: Meridee Score Netty Starring., MD;  Location: [facility] ENDOSCOPY;  Service: Gastroenterology;  Laterality: N/A;   EUS N/A 08/27/2018   Procedure: ESOPHAGEAL ENDOSCOPIC ULTRASOUND (EUS) RADIAL;  Surgeon: Meridee Score Netty Starring., MD;  Location: [facility] ENDOSCOPY;  Service: Gastroenterology;  Laterality: N/A;   FINE NEEDLE ASPIRATION  08/27/2018   Procedure: FINE NEEDLE ASPIRATION (FNA) LINEAR;  Surgeon: Lemar Lofty., MD;  Location: [facility] ENDOSCOPY;  Service: Gastroenterology;;   Sherald Hess HERNIA REPAIR N/A 06/23/2020   Procedure: OPEN INCISIONAL HERNIA REPAIR WITH MESH;  Surgeon: Quentin Ore, MD;  Location: [facility] ORS;  Service: General;  Laterality: N/A;  ROOM 2 STARTING AT 11:00AM FOR 90 MIN   IR IMAGING GUIDED PORT INSERTION  01/08/2018   LAPAROSCOPIC NEPHRECTOMY Left 08/01/2016   Procedure: LAPAROSCOPIC  RADICAL NEPHRECTOMY/ REPAIR OF UMBILICAL HERNIA;  Surgeon: Heloise Purpura, MD;  Location: [facility] ORS;  Service: Urology;  Laterality: Left;   PERIPHERAL VASCULAR INTERVENTION  10/21/2022   Procedure:  PERIPHERAL VASCULAR INTERVENTION;  Surgeon: Maeola Harman, MD;  Location: [facility] INVASIVE CV LAB;  Service: Cardiovascular;;   REMOVAL OF STONES  07/23/2018   Procedure: REMOVAL OF GALL STONES;  Surgeon: Meridee Score Netty Starring., MD;  Location: [facility] ENDOSCOPY;  Service: Gastroenterology;;   REMOVAL OF STONES  08/10/2018   Procedure: REMOVAL OF STONES;  Surgeon: Lemar Lofty., MD;  Location: [facility] ENDOSCOPY;  Service: Gastroenterology;;   REMOVAL OF STONES  08/27/2018   Procedure: REMOVAL OF STONES;  Surgeon: Lemar Lofty., MD;  Location: [facility] ENDOSCOPY;  Service: Gastroenterology;;   REMOVAL OF STONES  01/24/2020   Procedure: REMOVAL OF STONES;  Surgeon: Lemar Lofty., MD;  Location: [facility] ENDOSCOPY;  Service: Gastroenterology;;   Dennison Mascot  07/23/2018   Procedure: Dennison Mascot;  Surgeon: Lemar Lofty., MD;  Location: [facility] ENDOSCOPY;  Service: Gastroenterology;;   Francine Graven REMOVAL  08/27/2018   Procedure: STENT REMOVAL;  Surgeon: Lemar Lofty., MD;  Location: [facility] ENDOSCOPY;  Service: Gastroenterology;;   Francine Graven REMOVAL  11/30/2018   Procedure: STENT REMOVAL;  Surgeon: Lemar Lofty., MD;  Location: [facility] ENDOSCOPY;  Service: Gastroenterology;;   Francine Graven REMOVAL  01/24/2020   Procedure: STENT REMOVAL;  Surgeon: Lemar Lofty., MD;  Location: [facility] ENDOSCOPY;  Service: Gastroenterology;;   UPPER GI ENDOSCOPY     x 1   VAGINAL PROLAPSE REPAIR  11/18/2019  [facility]    SOCIAL HISTORY: Social History   Socioeconomic History   Marital status: Widowed    Spouse name: Not on file   Number of children: 3   Years of education: Not on file   Highest education level: Not on file  Occupational History   Occupation: retired  Tobacco Use   Smoking status: Former    Current packs/day: 0.00    Average packs/day: 1 pack/day for 8.0 years (8.0 ttl pk-yrs)    Types: Cigarettes    Start date: 06/18/1956    Quit date: 06/18/1964    Years since quitting:  59.0   Smokeless tobacco: Never  Vaping Use   Vaping status: Never Used  Substance and Sexual Activity   Alcohol use: No   Drug use: No   Sexual activity: Not Currently    Birth control/protection: Post-menopausal  Other Topics Concern   Not on file  Social History Narrative   Married.  Three children   Social Drivers of Corporate investment banker Strain: Low Risk  (05/23/2022)   Overall Financial Resource Strain (CARDIA)    Difficulty of Paying Living Expenses: Not hard at all  Food Insecurity: No Food Insecurity (05/23/2022)   Hunger Vital Sign    Worried About Running Out of Food in the Last Year: Never true    Ran Out of Food in the Last Year: Never true  Transportation Needs: No Transportation Needs (05/23/2022)   PRAPARE - Administrator, Civil Service (Medical): No    Lack of Transportation (Non-Medical): No  Physical Activity: Not on file  Stress: Not on file  Social Connections: Not on file  Intimate Partner Violence: Not At Risk (05/23/2022)   Humiliation, Afraid, Rape, and Kick questionnaire    Fear of Current or Ex-Partner: No    Emotionally Abused: No    Physically Abused: No    Sexually Abused: No    FAMILY HISTORY: Family History  Problem Relation Age of Onset   Heart attack Father 4   Heart disease Brother 41       CABG   Colon cancer Neg Hx    Esophageal cancer Neg Hx    Inflammatory bowel disease Neg Hx    Liver disease Neg Hx    Pancreatic cancer Neg Hx    Rectal cancer Neg Hx    Stomach cancer Neg Hx      ROS  10 Point review of Systems was done is negative except as noted above.   PHYSICAL EXAMINATION .BP 138/72 (BP Location: Right Arm, Patient Position: Sitting)   Pulse 74   Temp 97.7 F (36.5 C) (Temporal)   Resp 17   Wt 119 lb 12.8 oz (54.3 kg)   LMP  (LMP Unknown)   SpO2 100%   BMI 25.04 kg/m  . GENERAL:alert, in no acute distress and comfortable SKIN: no acute rashes, no significant lesions EYES: conjunctiva are  pink and non-injected, sclera anicteric OROPHARYNX: MMM, no exudates, no oropharyngeal erythema or ulceration NECK: supple, no JVD LYMPH:  no palpable lymphadenopathy in the cervical, axillary or inguinal regions LUNGS: clear to auscultation b/l with normal respiratory effort HEART: regular rate & rhythm ABDOMEN:  normoactive bowel sounds , non tender, not distended. Extremity: no pedal edema PSYCH: alert & oriented x 3 with fluent speech NEURO: no focal motor/sensory deficits    LABORATORY DATA: .    Latest Ref Rng & Units 06/23/2023   10:42 AM 01/29/2023   10:43 AM 01/01/2023  10:17 AM  CBC  WBC 4.0 - 10.5 K/uL 8.7  10.4  10.5   Hemoglobin 12.0 - 15.0 g/dL 16.1  09.6  04.5   Hematocrit 36.0 - 46.0 % 38.8  42.3  42.2   Platelets 150 - 400 K/uL 260  305  298    .    Latest Ref Rng & Units 06/23/2023   10:42 AM 01/29/2023   10:43 AM 01/01/2023   10:17 AM  CMP  Glucose 70 - 99 mg/dL 409  811  914   BUN 8 - 23 mg/dL 17  33  23   Creatinine 0.44 - 1.00 mg/dL 7.82  9.56  2.13   Sodium 135 - 145 mmol/L 135  135  139   Potassium 3.5 - 5.1 mmol/L 3.8  4.6  4.1   Chloride 98 - 111 mmol/L 106  108  113   CO2 22 - 32 mmol/L 22  20  19    Calcium 8.9 - 10.3 mg/dL 9.4  8.9  9.0   Total Protein 6.5 - 8.1 g/dL 6.1  6.8  6.7   Total Bilirubin 0.0 - 1.2 mg/dL 0.5  0.4  0.6   Alkaline Phos 38 - 126 U/L 92  48  50   AST 15 - 41 U/L 54  11  12   ALT 0 - 44 U/L 93  12  10    RADIOLOGY  Pet/ct 05/29/2023: IMPRESSION: 1. The mid to distal left femoral shaft focus of hypermetabolism on the prior exam was not included on today's study. If evaluation this area is desired, a whole-body PET would be needed. 2. Otherwise, left nephrectomy, without metastatic disease. 3. Apparent bladder wall thickening could be due to underdistention and/or cystitis. 4. Incidental findings, including: Coronary artery atherosclerosis. Aortic Atherosclerosis (ICD10-I70.0). Marked pelvic floor laxity. Sinus  disease.     Electronically Signed   By: Jeronimo Greaves M.D.   On: 06/12/2023 09:44    ASSESSMENT and PLAN:   Audrey [name] is a 83 y.o. female who returns for a follow up for metastatic renal cell carcinoma.   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 -Rt adrenal gland bx - showed clear cell RCC -S/p Cytoreductive left radical nephrectomy and left adrenal gland resection on 08/01/2016 by Dr Laverle Patter. -Started systemic therapy with Nivolumab 240 mg on 01/02/2018 and Xgeva on 02/27/2018.  -Most recent PET scan from 11/09/2021 showed no clear evidence of new or progressive disease.   # Hypothyroidism/Adrenal insufficiency/Diabetes -Continue being followed by Dr. Sharl Ma   #H/o Choledocholithiasis and cholelithiasis -07/10/18 US Abdomen revealed Biliary duct dilatation with 8 mm calculus at the level of the ampulla in the distal common bile duct. 2. Cholelithiasis. No gallbladder wall thickening or pericholecystic fluid. 3. Appearance of the liver raises concern for underlying hepatic cirrhosis. No focal liver lesions are demonstrable. 4.  Left kidney absent. 5.  Small cysts in right kidney. 6.  Aortic Atherosclerosis. -S/p ERCP on 07/23/18 with Dr. Vicente Serene Mansouraty -09/02/18 Cytopathology from ERCP was suspicious for malignant cells in bile duct, but was not definitive, other two findings were benign.  #Right foot wound 2/2 PAD and DM: --Last evaluated by wound care from [facility] in [place]. --Recent completed course of cephalexin therapy approximately one week ago --Recommend follow up with wound care. Sent another round of abx therapy with Augmentin as patient is unable to tolerate doxycycline and has D2D interaction with  bactrim. Trying to avoid clindamycin due to age and risk of C.diff.   PLAN:    -labs done today reviewed with the patient. Cbc/diff -- stable Cmp shows abnormal Lfts likely  from fatty liver/medications like statins. Pet/ct shows no overt evidence of progressive RCC. Since left femur was not imaged and she had previous fdg avid lesion --will order CT femur for further evaluation to r/o metastatic lesion. -will plan for port flush in 2 months -Ct left femur within 1 week to evaluate FDG avid lesions and left thigh pain -phone visit with Dr Candise Che in 2 weeks  -f/u with PCP for mx of other chronic medical issues.  FOLLOW-UP: -Ct left femur within 1 week to evaluate FDG avid lesions and left thigh pain -phone visit with Dr Candise Che in 2 weeks   .The total time spent in the appointment was 25 minutes* .  All of the patient's questions were answered with apparent satisfaction. The patient knows to call the clinic with any problems, questions or concerns.   Wyvonnia Lora MD MS AAHIVMS [facility] Hematology/Oncology Physician [facility]  .*Total Encounter Time as defined by the Centers for Medicare and Medicaid Services includes, in addition to the face-to-face time of a patient visit (documented in the note above) non-face-to-face time: obtaining and reviewing outside history, ordering and reviewing medications, tests or procedures, care coordination (communications with other health care professionals or caregivers) and documentation in the medical record.

## 2023-06-30 ENCOUNTER — Encounter: Payer: Self-pay | Admitting: Hematology

## 2023-07-07 ENCOUNTER — Encounter: Payer: Self-pay | Admitting: Hematology

## 2023-07-07 ENCOUNTER — Inpatient Hospital Stay: Admitting: Hematology

## 2023-07-07 NOTE — Progress Notes (Signed)
 CT femur - 06/27/2023 was read after the appointment.  Showed  IMPRESSION: 1. No acute osseous injury of the left femur. 2. No CT correlate for the hypermetabolic activity seen on recent PET-CT dated 05/29/2023 in the mid-distal left femoral diaphysis. If there is persistent clinical concern for an osseous metastasis, recommend a an MRI of left femur without and with intravenous contrast for further evaluation. 3. Mild osteoarthritis of the left hip. 4. Moderate osteoarthritis of the medial femorotibial compartment and mild osteoarthritis of the lateral femorotibial compartment.     Electronically Signed   By: Elige Ko M.D.   On: 07/07/2023 14:32   This encounter was created in error - please disregard.

## 2023-07-13 ENCOUNTER — Encounter: Payer: Self-pay | Admitting: Hematology

## 2023-07-14 ENCOUNTER — Ambulatory Visit (INDEPENDENT_AMBULATORY_CARE_PROVIDER_SITE_OTHER): Admitting: Podiatry

## 2023-07-14 ENCOUNTER — Encounter: Payer: Self-pay | Admitting: Podiatry

## 2023-07-14 DIAGNOSIS — M79674 Pain in right toe(s): Secondary | ICD-10-CM

## 2023-07-14 DIAGNOSIS — M79675 Pain in left toe(s): Secondary | ICD-10-CM | POA: Diagnosis not present

## 2023-07-14 DIAGNOSIS — B351 Tinea unguium: Secondary | ICD-10-CM

## 2023-07-15 ENCOUNTER — Other Ambulatory Visit: Payer: Self-pay | Admitting: Hematology

## 2023-07-15 NOTE — Progress Notes (Signed)
  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 She would like them trimmed.      83 y.o. female presents with the above complaint. History confirmed with patient.   Objective:  Physical Exam: warm, good capillary refill, no trophic changes or ulcerative lesions, normal DP and PT pulses, and abnormal sensory exam with polyneuropathy. Left Foot: dystrophic yellowed discolored nail plates with subungual debris Right Foot: dystrophic yellowed discolored nail plates with subungual debris  Assessment:   1. Pain due to onychomycosis of toenails of both feet      Plan:  Patient was evaluated and treated and all questions answered.  Discussed the etiology and treatment options for the condition in detail with the patient. Educated patient on the topical and oral treatment options for mycotic nails.  Doubtful she is a good candidate for oral therapy and topical medications likely would not be effective.  Recommended debridement of the nails today. Sharp and mechanical debridement performed of all painful and mycotic nails today. Nails debrided in length and thickness using a nail nipper to level of comfort.    Return in about 3 months (around 10/14/2023) for at risk diabetic foot care.

## 2023-07-16 DIAGNOSIS — N3281 Overactive bladder: Secondary | ICD-10-CM | POA: Diagnosis present

## 2023-07-24 ENCOUNTER — Ambulatory Visit: Admitting: Podiatry

## 2023-07-25 ENCOUNTER — Other Ambulatory Visit: Payer: Self-pay

## 2023-08-06 ENCOUNTER — Other Ambulatory Visit: Payer: Self-pay | Admitting: Hematology and Oncology

## 2023-08-06 ENCOUNTER — Other Ambulatory Visit: Payer: Self-pay

## 2023-08-06 ENCOUNTER — Other Ambulatory Visit: Payer: Self-pay | Admitting: *Deleted

## 2023-08-06 ENCOUNTER — Inpatient Hospital Stay: Payer: Medicare Other | Attending: Hematology

## 2023-08-06 ENCOUNTER — Encounter: Payer: Self-pay | Admitting: Hematology

## 2023-08-06 DIAGNOSIS — C642 Malignant neoplasm of left kidney, except renal pelvis: Secondary | ICD-10-CM

## 2023-08-06 DIAGNOSIS — C7951 Secondary malignant neoplasm of bone: Secondary | ICD-10-CM

## 2023-08-06 DIAGNOSIS — Z85828 Personal history of other malignant neoplasm of skin: Secondary | ICD-10-CM | POA: Insufficient documentation

## 2023-08-06 DIAGNOSIS — Z95828 Presence of other vascular implants and grafts: Secondary | ICD-10-CM

## 2023-08-06 DIAGNOSIS — Z85528 Personal history of other malignant neoplasm of kidney: Secondary | ICD-10-CM | POA: Diagnosis present

## 2023-08-06 MED ORDER — SODIUM CHLORIDE 0.9% FLUSH
10.0000 mL | Freq: Once | INTRAVENOUS | Status: AC
  Administered 2023-08-06: 10 mL via INTRAVENOUS

## 2023-08-06 MED ORDER — LIDOCAINE 5 % EX PTCH: 1.0000 | MEDICATED_PATCH | Freq: Every day | CUTANEOUS | 1 refills | Status: DC | PRN

## 2023-08-06 MED ORDER — HEPARIN SOD (PORK) LOCK FLUSH 100 UNIT/ML IV SOLN: 500.0000 [IU] | Freq: Once | INTRAVENOUS | Status: DC

## 2023-08-06 MED ORDER — SODIUM CHLORIDE 0.9% FLUSH: 10.0000 mL | INTRAVENOUS | Status: DC | PRN

## 2023-08-06 MED ORDER — HEPARIN SOD (PORK) LOCK FLUSH 100 UNIT/ML IV SOLN
500.0000 [IU] | Freq: Once | INTRAVENOUS | Status: AC
  Administered 2023-08-06: 500 [IU] via INTRAVENOUS

## 2023-08-07 ENCOUNTER — Encounter: Payer: Self-pay | Admitting: Hematology

## 2023-08-07 ENCOUNTER — Other Ambulatory Visit: Payer: Self-pay | Admitting: Hematology

## 2023-08-07 DIAGNOSIS — C642 Malignant neoplasm of left kidney, except renal pelvis: Secondary | ICD-10-CM

## 2023-08-07 MED ORDER — TRAMADOL HCL 50 MG PO TABS: 50.0000 mg | ORAL_TABLET | Freq: Four times a day (QID) | ORAL | 0 refills | Status: DC | PRN

## 2023-08-12 ENCOUNTER — Encounter: Payer: Self-pay | Admitting: Hematology

## 2023-08-18 ENCOUNTER — Other Ambulatory Visit: Payer: Self-pay

## 2023-08-25 ENCOUNTER — Encounter: Payer: Self-pay | Admitting: Hematology

## 2023-09-19 ENCOUNTER — Encounter: Payer: Self-pay | Admitting: Hematology

## 2023-09-23 ENCOUNTER — Other Ambulatory Visit: Payer: Self-pay

## 2023-09-23 DIAGNOSIS — C642 Malignant neoplasm of left kidney, except renal pelvis: Secondary | ICD-10-CM

## 2023-09-23 DIAGNOSIS — C7951 Secondary malignant neoplasm of bone: Secondary | ICD-10-CM

## 2023-09-23 MED ORDER — LIDOCAINE 5 % EX PTCH: 1.0000 | MEDICATED_PATCH | Freq: Every day | CUTANEOUS | 1 refills | Status: DC | PRN

## 2023-09-26 ENCOUNTER — Other Ambulatory Visit: Payer: Self-pay

## 2023-09-26 DIAGNOSIS — C642 Malignant neoplasm of left kidney, except renal pelvis: Secondary | ICD-10-CM

## 2023-09-30 ENCOUNTER — Encounter: Payer: Self-pay | Admitting: Hematology

## 2023-09-30 MED ORDER — TRAMADOL HCL 50 MG PO TABS: 50.0000 mg | ORAL_TABLET | Freq: Four times a day (QID) | ORAL | 0 refills | Status: DC | PRN

## 2023-10-01 ENCOUNTER — Ambulatory Visit (INDEPENDENT_AMBULATORY_CARE_PROVIDER_SITE_OTHER): Admitting: Podiatry

## 2023-10-01 ENCOUNTER — Encounter: Payer: Self-pay | Admitting: Podiatry

## 2023-10-01 ENCOUNTER — Other Ambulatory Visit: Payer: Self-pay

## 2023-10-01 ENCOUNTER — Inpatient Hospital Stay: Payer: Medicare Other | Attending: Hematology

## 2023-10-01 DIAGNOSIS — C642 Malignant neoplasm of left kidney, except renal pelvis: Secondary | ICD-10-CM

## 2023-10-01 DIAGNOSIS — E1142 Type 2 diabetes mellitus with diabetic polyneuropathy: Secondary | ICD-10-CM | POA: Diagnosis not present

## 2023-10-01 DIAGNOSIS — M79674 Pain in right toe(s): Secondary | ICD-10-CM | POA: Diagnosis not present

## 2023-10-01 DIAGNOSIS — Z85528 Personal history of other malignant neoplasm of kidney: Secondary | ICD-10-CM | POA: Diagnosis not present

## 2023-10-01 DIAGNOSIS — Z452 Encounter for adjustment and management of vascular access device: Secondary | ICD-10-CM | POA: Diagnosis present

## 2023-10-01 DIAGNOSIS — M79675 Pain in left toe(s): Secondary | ICD-10-CM | POA: Diagnosis not present

## 2023-10-01 DIAGNOSIS — B351 Tinea unguium: Secondary | ICD-10-CM | POA: Diagnosis not present

## 2023-10-01 MED ORDER — SODIUM CHLORIDE 0.9% FLUSH
10.0000 mL | Freq: Once | INTRAVENOUS | Status: AC
  Administered 2023-10-01: 10 mL via INTRAVENOUS

## 2023-10-01 MED ORDER — HEPARIN SOD (PORK) LOCK FLUSH 100 UNIT/ML IV SOLN
500.0000 [IU] | Freq: Once | INTRAVENOUS | Status: AC
  Administered 2023-10-01: 500 [IU] via INTRAVENOUS

## 2023-10-01 NOTE — Progress Notes (Signed)
 This patient returns to my office for at risk foot care.  This patient requires this care by a professional since this patient will be at risk due to having diabetic neuropathy.  This patient is unable to cut nails herself since the patient cannot reach her nails.These nails are painful walking and wearing shoes.  This patient presents for at risk foot care today.  General Appearance  Alert, conversant and in no acute stress.  Vascular  Dorsalis pedis and posterior tibial  pulses are palpable  bilaterally.  Capillary return is within normal limits  bilaterally. Temperature is within normal limits  bilaterally.  Neurologic  Senn-Weinstein monofilament wire test diminished bilaterally. Muscle power within normal limits bilaterally.  Nails Thick disfigured discolored nails with subungual debris  from hallux to fifth toes bilaterally. No evidence of bacterial infection or drainage bilaterally.  Orthopedic  No limitations of motion  feet .  No crepitus or effusions noted.  No bony pathology or digital deformities noted.  Skin  normotropic skin with no porokeratosis noted bilaterally.  No signs of infections or ulcers noted.     Onychomycosis  Pain in right toes  Pain in left toes  Consent was obtained for treatment procedures.   Mechanical debridement of nails 1-5  bilaterally performed with a nail nipper.  Filed with dremel without incident.    Return office visit                     Told patient to return for periodic foot care and evaluation due to potential at risk complications.   Ruffin Cotton DPM

## 2023-10-03 ENCOUNTER — Other Ambulatory Visit: Payer: Self-pay

## 2023-10-06 NOTE — Progress Notes (Signed)
 HPI:  The patient is reporting symptoms of UTI today.  The patient  is not taking an active antibiotic today.  The patient is currently taking cranberry, probiotic, and vaginal estrogen for UTI prevention.    The last documented Urine culture in our system was:   Lab Results  Component Value Date   URCULT Mixed urogenital flora 08/25/2023   No components found for: LC-URINE CULTURE, ROUTINE No components found for: LC-RESULT 1  Patient also has complaints for vaginal prolapse.  Reports symptoms of bothersome pressure and when she bends over she feels a pinch in vaginal area.

## 2023-10-08 NOTE — Progress Notes (Signed)
 Spoke to patient and informed of positive Guidance UTI result. Sent in Macrobid  per protocol and instructed patient to complete full course and give our office a callback if she develops worsening symptoms after finishing treatment. Patient verbalized understanding.

## 2023-11-06 ENCOUNTER — Other Ambulatory Visit: Payer: Self-pay | Admitting: Hematology

## 2023-12-02 ENCOUNTER — Other Ambulatory Visit: Payer: Self-pay

## 2023-12-02 DIAGNOSIS — E039 Hypothyroidism, unspecified: Secondary | ICD-10-CM

## 2023-12-02 DIAGNOSIS — C7951 Secondary malignant neoplasm of bone: Secondary | ICD-10-CM

## 2023-12-02 DIAGNOSIS — C642 Malignant neoplasm of left kidney, except renal pelvis: Secondary | ICD-10-CM

## 2023-12-03 ENCOUNTER — Inpatient Hospital Stay

## 2023-12-03 ENCOUNTER — Inpatient Hospital Stay: Attending: Hematology | Admitting: Hematology

## 2023-12-03 VITALS — BP 140/68 | HR 67 | Temp 97.6°F | Resp 18 | Wt 120.5 lb

## 2023-12-03 DIAGNOSIS — C7951 Secondary malignant neoplasm of bone: Secondary | ICD-10-CM

## 2023-12-03 DIAGNOSIS — Z87891 Personal history of nicotine dependence: Secondary | ICD-10-CM | POA: Diagnosis not present

## 2023-12-03 DIAGNOSIS — C7971 Secondary malignant neoplasm of right adrenal gland: Secondary | ICD-10-CM | POA: Diagnosis not present

## 2023-12-03 DIAGNOSIS — C7801 Secondary malignant neoplasm of right lung: Secondary | ICD-10-CM | POA: Insufficient documentation

## 2023-12-03 DIAGNOSIS — C642 Malignant neoplasm of left kidney, except renal pelvis: Secondary | ICD-10-CM | POA: Insufficient documentation

## 2023-12-03 DIAGNOSIS — E039 Hypothyroidism, unspecified: Secondary | ICD-10-CM

## 2023-12-03 DIAGNOSIS — Z905 Acquired absence of kidney: Secondary | ICD-10-CM | POA: Diagnosis not present

## 2023-12-03 DIAGNOSIS — C7972 Secondary malignant neoplasm of left adrenal gland: Secondary | ICD-10-CM | POA: Diagnosis not present

## 2023-12-03 DIAGNOSIS — E034 Atrophy of thyroid (acquired): Secondary | ICD-10-CM | POA: Insufficient documentation

## 2023-12-03 LAB — CMP (CANCER CENTER ONLY)
ALT: 13 U/L (ref 0–44)
AST: 10 U/L — ABNORMAL LOW (ref 15–41)
Albumin: 3.8 g/dL (ref 3.5–5.0)
Alkaline Phosphatase: 129 U/L — ABNORMAL HIGH (ref 38–126)
Anion gap: 4 — ABNORMAL LOW (ref 5–15)
BUN: 37 mg/dL — ABNORMAL HIGH (ref 8–23)
CO2: 23 mmol/L (ref 22–32)
Calcium: 9.2 mg/dL (ref 8.9–10.3)
Chloride: 107 mmol/L (ref 98–111)
Creatinine: 1.67 mg/dL — ABNORMAL HIGH (ref 0.44–1.00)
GFR, Estimated: 31 mL/min — ABNORMAL LOW (ref >60)
Glucose, Bld: 235 mg/dL — ABNORMAL HIGH (ref 70–99)
Potassium: 4.4 mmol/L (ref 3.5–5.1)
Sodium: 134 mmol/L — ABNORMAL LOW (ref 135–145)
Total Bilirubin: 0.4 mg/dL (ref 0.0–1.2)
Total Protein: 6.3 g/dL — ABNORMAL LOW (ref 6.5–8.1)

## 2023-12-03 LAB — CBC WITH DIFFERENTIAL (CANCER CENTER ONLY)
Abs Immature Granulocytes: 0.15 K/uL — ABNORMAL HIGH (ref 0.00–0.07)
Basophils Absolute: 0.1 K/uL (ref 0.0–0.1)
Basophils Relative: 1 %
Eosinophils Absolute: 0.1 K/uL (ref 0.0–0.5)
Eosinophils Relative: 1 %
HCT: 38.1 % (ref 36.0–46.0)
Hemoglobin: 12.6 g/dL (ref 12.0–15.0)
Immature Granulocytes: 2 %
Lymphocytes Relative: 11 %
Lymphs Abs: 1 K/uL (ref 0.7–4.0)
MCH: 30.5 pg (ref 26.0–34.0)
MCHC: 33.1 g/dL (ref 30.0–36.0)
MCV: 92.3 fL (ref 80.0–100.0)
Monocytes Absolute: 1 K/uL (ref 0.1–1.0)
Monocytes Relative: 10 %
Neutro Abs: 7.5 K/uL (ref 1.7–7.7)
Neutrophils Relative %: 75 %
Platelet Count: 233 K/uL (ref 150–400)
RBC: 4.13 MIL/uL (ref 3.87–5.11)
RDW: 14 % (ref 11.5–15.5)
WBC Count: 9.8 K/uL (ref 4.0–10.5)
nRBC: 0 % (ref 0.0–0.2)

## 2023-12-03 LAB — TSH: TSH: 1.46 u[IU]/mL (ref 0.350–4.500)

## 2023-12-04 LAB — T4: T4, Total: 7.2 ug/dL (ref 4.5–12.0)

## 2023-12-05 ENCOUNTER — Other Ambulatory Visit: Payer: Self-pay

## 2023-12-09 ENCOUNTER — Encounter: Payer: Self-pay | Admitting: Hematology

## 2023-12-09 NOTE — Progress Notes (Signed)
 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  SUMMARY OF ONCOLOGIC HISTORY: Oncology History  Cancer, 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HCC)  01/10/2017 Initial Diagnosis   Cancer, 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HCC)   01/02/2018 - 01/16/2022 Chemotherapy   Patient is on Treatment Plan : RENAL CELL CARCINOMA NIVOLUMAB  Q14D     01/22/2022 Cancer Staging   Staging form: Bone - Appendicular Skeleton, Trunk, Skull, and Facial Bones, AJCC 8th Edition - Clinical: Stage IVB (pM1b) - Signed by Onesimo Emaline Brink, MD on 01/22/2022   02/01/2022 -  Chemotherapy   Patient is on Treatment Plan : RENAL CELL CARCINOMA Nivolumab  (240) q14d/     Renal cell carcinoma, left (HCC)  04/11/2017 Initial Diagnosis   Renal cell carcinoma (HCC)   01/02/2018 - 01/16/2022 Chemotherapy   The patient had nivolumab  (OPDIVO ) 240 mg in sodium chloride  0.9 % 100 mL chemo infusion, 240 mg, Intravenous, Once, 47 of 50 cycles Administration: 240 mg (01/02/2018), 240 mg (01/15/2018), 240 mg (01/30/2018), 240 mg (02/13/2018), 240 mg (02/27/2018), 240 mg (03/13/2018), 240 mg (03/27/2018), 240 mg (04/10/2018), 240 mg (04/24/2018), 240 mg (05/08/2018), 240 mg (05/22/2018), 240 mg (06/05/2018), 240 mg (06/19/2018), 240 mg (08/28/2018), 240 mg (09/11/2018), 240 mg (10/09/2018), 240 mg (10/22/2018), 240 mg (11/06/2018), 240 mg (11/20/2018), 240 mg (12/04/2018), 240 mg (12/18/2018), 240 mg (01/01/2019), 240 mg (01/15/2019), 240 mg (01/29/2019), 240 mg (02/12/2019), 240 mg (02/26/2019), 240 mg (03/12/2019), 240 mg (03/26/2019), 240 mg (04/09/2019), 240 mg (04/28/2019), 240 mg (05/12/2019), 240 mg (05/27/2019), 240 mg (06/09/2019), 240 mg (06/23/2019), 240 mg (07/07/2019), 240 mg (07/21/2019), 240 mg (08/04/2019), 240 mg (08/18/2019), 240 mg (09/03/2019), 240 mg (09/15/2019), 240 mg (09/29/2019), 240 mg (10/12/2019), 240 mg (10/27/2019), 240 mg  (11/10/2019), 240 mg (12/08/2019), 240 mg (12/22/2019), 240 mg (01/19/2020)  for chemotherapy treatment.    02/01/2022 -  Chemotherapy   Patient is on Treatment Plan : RENAL CELL CARCINOMA Nivolumab  (240) q14d/       CURRENT THERAPY: metastatic renal cell carcinoma, Xgeva  and Nivolumab   INTERVAL HISTORY:  Faron Whitelock Liston 83 y.o. female is here for continued evaluation and management of metastatic renal cell carcinoma.  Her last visit with us  was in March 2025. Patient notes ongoing issues with dysuria likely related to atrophic urethro vaginitis.  She notes that the Premarin  cream has helped some but her urogynecologist gave her a prescription for estradiol cream which she still has to fill.  No new focal symptoms.  No new shortness of breath or chest pain.  No new bone pains. Continues to have issues with neuropathy for which she follows with her PCP. No new abdominal pain or change in bowel habits.    Patient Active Problem List   Diagnosis Date Noted   PAD (peripheral artery disease) (HCC) 10/21/2022   Atrophy of thyroid  (acquired) 01/15/2022   At risk for falls 01/15/2022   CKD (chronic kidney disease) 01/15/2022   Diabetic ulcer of foot with fat layer exposed (HCC) 01/15/2022   Dizziness 01/15/2022   Dysuria 01/15/2022   Knee osteoarthritis 01/15/2022   Pulmonary nodule 01/15/2022   Type 2 diabetes mellitus (HCC) 08/15/2021   Incisional hernia 06/23/2020   Gastritis and gastroduodenitis 02/26/2020   Rectal discomfort 02/26/2020   History of colonic polyps 02/26/2020   Ventral hernia 11/10/2019   History of cholecystectomy 07/17/2019   Degeneration of lumbar intervertebral disc  11/12/2018   Anemia 10/10/2018   Chronic cholecystitis 09/04/2018   Acute cholecystitis 08/29/2018   Biliary stricture 08/23/2018   Medicare annual wellness visit, subsequent 08/14/2018   CAD (coronary artery disease) 08/14/2018   Dyslipidemia 08/14/2018   Abnormal LFTs 08/05/2018    Choledocholithiasis 08/05/2018   Dilated bile duct 08/05/2018   History of ERCP 08/05/2018   History of renal cell carcinoma 08/05/2018   Port-A-Cath in place 01/30/2018   Counseling regarding advance care planning and goals of care 01/04/2018   Renal cell carcinoma, left (HCC) 04/11/2017   Cancer, 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HCC) 01/10/2017   Left renal mass 08/01/2016   Hyperlipidemia 05/23/2015   Essential hypertension 05/23/2015   Hypothyroidism 05/23/2015    is allergic to doxycycline, adhesive [tape], duloxetine, gabapentin  (once-daily), nsaids, and statins.  MEDICAL HISTORY: Past Medical History:  Diagnosis Date   Anemia    Arthritis    lower back, hips, hands   Biliary stricture    Diabetes mellitus (HCC)    type 2    Early cataracts, bilateral    Md just watching   Elevated liver enzymes    Family history of adverse reaction to anesthesia    Daughter hard to wake up   Gallstones    GERD (gastroesophageal reflux disease)    occasional - diet controlled   History of blood transfusion 2018   History of hiatal hernia    HTN (hypertension)    Hyperlipidemia    Hypothyroidism    left renal ca dx'd 2018   renal cancer - left kidney removed, pill chemo x 1 yr   Myocardial infarction (HCC) 1991   no deficits   Peripheral vascular disease (HCC)    SVD (spontaneous vaginal delivery)    x 3   Wears glasses     SURGICAL HISTORY: Past Surgical History:  Procedure Laterality Date   ABDOMINAL AORTOGRAM W/LOWER EXTREMITY N/A 10/21/2022   Procedure: ABDOMINAL AORTOGRAM W/LOWER EXTREMITY;  Surgeon: Sheree Penne Bruckner, MD;  Location: [facility] INVASIVE CV LAB;  Service: Cardiovascular;  Laterality: N/A;   BALLOON DILATION N/A 07/23/2018   Procedure: BALLOON DILATION;  Surgeon: Wilhelmenia Aloha Raddle., MD;  Location: [facility] ENDOSCOPY;  Service: Gastroenterology;  Laterality: N/A;   BILIARY BRUSHING  08/10/2018   Procedure: BILIARY BRUSHING;  Surgeon: Wilhelmenia Aloha Raddle., MD;  Location:  [facility] ENDOSCOPY;  Service: Gastroenterology;;   BILIARY BRUSHING  11/30/2018   Procedure: BILIARY BRUSHING;  Surgeon: Wilhelmenia Aloha Raddle., MD;  Location: [facility] ENDOSCOPY;  Service: Gastroenterology;;   BILIARY DILATION  08/10/2018   Procedure: BILIARY DILATION;  Surgeon: Wilhelmenia Aloha Raddle., MD;  Location: [facility] ENDOSCOPY;  Service: Gastroenterology;;   BILIARY DILATION  08/27/2018   Procedure: BILIARY DILATION;  Surgeon: Wilhelmenia Aloha Raddle., MD;  Location: [facility] ENDOSCOPY;  Service: Gastroenterology;;   BILIARY DILATION  01/24/2020   Procedure: BILIARY DILATION;  Surgeon: Wilhelmenia Aloha Raddle., MD;  Location: [facility] ENDOSCOPY;  Service: Gastroenterology;;   BILIARY STENT PLACEMENT  08/10/2018   Procedure: BILIARY STENT PLACEMENT;  Surgeon: Wilhelmenia Aloha Raddle., MD;  Location: [facility] ENDOSCOPY;  Service: Gastroenterology;;   BILIARY STENT PLACEMENT  08/27/2018   Procedure: BILIARY STENT PLACEMENT;  Surgeon: Wilhelmenia Aloha Raddle., MD;  Location: [facility] ENDOSCOPY;  Service: Gastroenterology;;   BILIARY STENT PLACEMENT  11/30/2018   Procedure: BILIARY STENT PLACEMENT;  Surgeon: Wilhelmenia Aloha Raddle., MD;  Location: [facility] ENDOSCOPY;  Service: Gastroenterology;;   BIOPSY  07/23/2018   Procedure: BIOPSY;  Surgeon: Wilhelmenia Aloha Raddle., MD;  Location: [facility] ENDOSCOPY;  Service: Gastroenterology;;  BIOPSY  01/24/2020   Procedure: BIOPSY;  Surgeon: Wilhelmenia Aloha Raddle., MD;  Location: [facility] ENDOSCOPY;  Service: Gastroenterology;;   CHOLECYSTECTOMY N/A 09/02/2018   Procedure: LAPAROSCOPIC CHOLECYSTECTOMY;  Surgeon: Aron Shoulders, MD;  Location: [facility] OR;  Service: General;  Laterality: N/A;   COLONOSCOPY     normal    ENDOSCOPIC RETROGRADE CHOLANGIOPANCREATOGRAPHY (ERCP) WITH PROPOFOL  N/A 08/10/2018   Procedure: ENDOSCOPIC RETROGRADE CHOLANGIOPANCREATOGRAPHY (ERCP) WITH PROPOFOL ;  Surgeon: Wilhelmenia Aloha Raddle., MD;  Location: [facility] ENDOSCOPY;  Service: Gastroenterology;  Laterality: N/A;   ENDOSCOPIC RETROGRADE  CHOLANGIOPANCREATOGRAPHY (ERCP) WITH PROPOFOL  N/A 11/30/2018   Procedure: ENDOSCOPIC RETROGRADE CHOLANGIOPANCREATOGRAPHY (ERCP) WITH PROPOFOL ;  Surgeon: Wilhelmenia Aloha Raddle., MD;  Location: [facility] ENDOSCOPY;  Service: Gastroenterology;  Laterality: N/A;   ENDOSCOPIC RETROGRADE CHOLANGIOPANCREATOGRAPHY (ERCP) WITH PROPOFOL  N/A 01/24/2020   Procedure: ENDOSCOPIC RETROGRADE CHOLANGIOPANCREATOGRAPHY (ERCP) WITH PROPOFOL ;  Surgeon: Wilhelmenia Aloha Raddle., MD;  Location: [facility] ENDOSCOPY;  Service: Gastroenterology;  Laterality: N/A;   ERCP N/A 07/23/2018   Procedure: ENDOSCOPIC RETROGRADE CHOLANGIOPANCREATOGRAPHY (ERCP);  Surgeon: Wilhelmenia Aloha Raddle., MD;  Location: [facility] ENDOSCOPY;  Service: Gastroenterology;  Laterality: N/A;   ERCP N/A 08/27/2018   Procedure: ENDOSCOPIC RETROGRADE CHOLANGIOPANCREATOGRAPHY (ERCP);  Surgeon: Wilhelmenia Aloha Raddle., MD;  Location: [facility] ENDOSCOPY;  Service: Gastroenterology;  Laterality: N/A;   ESOPHAGOGASTRODUODENOSCOPY N/A 01/24/2020   Procedure: ESOPHAGOGASTRODUODENOSCOPY (EGD);  Surgeon: Wilhelmenia Aloha Raddle., MD;  Location: [facility] ENDOSCOPY;  Service: Gastroenterology;  Laterality: N/A;   ESOPHAGOGASTRODUODENOSCOPY (EGD) WITH PROPOFOL  N/A 08/27/2018   Procedure: ESOPHAGOGASTRODUODENOSCOPY (EGD) WITH PROPOFOL ;  Surgeon: Wilhelmenia Aloha Raddle., MD;  Location: [facility] ENDOSCOPY;  Service: Gastroenterology;  Laterality: N/A;   EUS N/A 08/27/2018   Procedure: ESOPHAGEAL ENDOSCOPIC ULTRASOUND (EUS) RADIAL;  Surgeon: Wilhelmenia Aloha Raddle., MD;  Location: [facility] ENDOSCOPY;  Service: Gastroenterology;  Laterality: N/A;   FINE NEEDLE ASPIRATION  08/27/2018   Procedure: FINE NEEDLE ASPIRATION (FNA) LINEAR;  Surgeon: Wilhelmenia Aloha Raddle., MD;  Location: [facility] ENDOSCOPY;  Service: Gastroenterology;;   SHIRLENE HERNIA REPAIR N/A 06/23/2020   Procedure: OPEN INCISIONAL HERNIA REPAIR WITH MESH;  Surgeon: Lyndel Deward PARAS, MD;  Location: [facility] ORS;  Service: General;  Laterality: N/A;  ROOM 2 STARTING AT  11:00AM FOR 90 MIN   IR IMAGING GUIDED PORT INSERTION  01/08/2018   LAPAROSCOPIC NEPHRECTOMY Left 08/01/2016   Procedure: LAPAROSCOPIC  RADICAL NEPHRECTOMY/ REPAIR OF UMBILICAL HERNIA;  Surgeon: Gretel Ferrara, MD;  Location: [facility] ORS;  Service: Urology;  Laterality: Left;   PERIPHERAL VASCULAR INTERVENTION  10/21/2022   Procedure: PERIPHERAL VASCULAR INTERVENTION;  Surgeon: Sheree Penne Bruckner, MD;  Location: [facility] INVASIVE CV LAB;  Service: Cardiovascular;;   REMOVAL OF STONES  07/23/2018   Procedure: REMOVAL OF GALL STONES;  Surgeon: Wilhelmenia Aloha Raddle., MD;  Location: [facility] ENDOSCOPY;  Service: Gastroenterology;;   REMOVAL OF STONES  08/10/2018   Procedure: REMOVAL OF STONES;  Surgeon: Wilhelmenia Aloha Raddle., MD;  Location: [facility] ENDOSCOPY;  Service: Gastroenterology;;   REMOVAL OF STONES  08/27/2018   Procedure: REMOVAL OF STONES;  Surgeon: Wilhelmenia Aloha Raddle., MD;  Location: [facility] ENDOSCOPY;  Service: Gastroenterology;;   REMOVAL OF STONES  01/24/2020   Procedure: REMOVAL OF STONES;  Surgeon: Wilhelmenia Aloha Raddle., MD;  Location: [facility] ENDOSCOPY;  Service: Gastroenterology;;   ANNETT  07/23/2018   Procedure: ANNETT;  Surgeon: Wilhelmenia Aloha Raddle., MD;  Location: [facility] ENDOSCOPY;  Service: Gastroenterology;;   CLEDA REMOVAL  08/27/2018   Procedure: STENT REMOVAL;  Surgeon: Wilhelmenia Aloha Raddle., MD;  Location: [facility] ENDOSCOPY;  Service: Gastroenterology;;   CLEDA REMOVAL  11/30/2018  Procedure: STENT REMOVAL;  Surgeon: Wilhelmenia Aloha Raddle., MD;  Location: [facility] ENDOSCOPY;  Service: Gastroenterology;;   CLEDA REMOVAL  01/24/2020   Procedure: STENT REMOVAL;  Surgeon: Wilhelmenia Aloha Raddle., MD;  Location: [facility] ENDOSCOPY;  Service: Gastroenterology;;   UPPER GI ENDOSCOPY     x 1   VAGINAL PROLAPSE REPAIR  11/18/2019   [facility]    SOCIAL HISTORY: Social History   Socioeconomic History   Marital status: Widowed    Spouse name: Not on file   Number of children: 3   Years of  education: Not on file   Highest education level: Not on file  Occupational History   Occupation: retired  Tobacco Use   Smoking status: Former    Current packs/day: 0.00    Average packs/day: 1 pack/day for 8.0 years (8.0 ttl pk-yrs)    Types: Cigarettes    Start date: 06/18/1956    Quit date: 06/18/1964    Years since quitting: 59.5   Smokeless tobacco: Never  Vaping Use   Vaping status: Never Used  Substance and Sexual Activity   Alcohol  use: No   Drug use: No   Sexual activity: Not Currently    Birth control/protection: Post-menopausal  Other Topics Concern   Not on file  Social History Narrative   Married.  Three children   Social Drivers of Corporate investment banker Strain: Low Risk  (05/23/2022)   Overall Financial Resource Strain (CARDIA)    Difficulty of Paying Living Expenses: Not hard at all  Food Insecurity: No Food Insecurity (05/23/2022)   Hunger Vital Sign    Worried About Running Out of Food in the Last Year: Never true    Ran Out of Food in the Last Year: Never true  Transportation Needs: No Transportation Needs (05/23/2022)   PRAPARE - Administrator, Civil Service (Medical): No    Lack of Transportation (Non-Medical): No  Physical Activity: Not on file  Stress: Not on file  Social Connections: Not on file  Intimate Partner Violence: Not At Risk (05/23/2022)   Humiliation, Afraid, Rape, and Kick questionnaire    Fear of Current or Ex-Partner: No    Emotionally Abused: No    Physically Abused: No    Sexually Abused: No    FAMILY HISTORY: Family History  Problem Relation Age of Onset   Heart attack Father 31   Heart disease Brother 21       CABG   Colon cancer Neg Hx    Esophageal cancer Neg Hx    Inflammatory bowel disease Neg Hx    Liver disease Neg Hx    Pancreatic cancer Neg Hx    Rectal cancer Neg Hx    Stomach cancer Neg Hx    ROS .10 Point review of Systems was done is negative except as noted above.  PHYSICAL  EXAMINATION .BP (!) 140/68   Pulse 67   Temp 97.6 F (36.4 C)   Resp 18   Wt 120 lb 8 oz (54.7 kg)   LMP  (LMP Unknown)   SpO2 100%   BMI 25.18 kg/m  . GENERAL:alert, in no acute distress and comfortable SKIN: no acute rashes, no significant lesions EYES: conjunctiva are pink and non-injected, sclera anicteric OROPHARYNX: MMM, no exudates, no oropharyngeal erythema or ulceration NECK: supple, no JVD LYMPH:  no palpable lymphadenopathy in the cervical, axillary or inguinal regions LUNGS: clear to auscultation b/l with normal respiratory effort HEART: regular rate & rhythm ABDOMEN:  normoactive bowel  sounds , non tender, not distended. Extremity: no pedal edema PSYCH: alert & oriented x 3 with fluent speech NEURO: no focal motor/sensory deficits   LABORATORY DATA: .    Latest Ref Rng & Units 12/03/2023    1:59 PM 06/23/2023   10:42 AM 01/29/2023   10:43 AM  CBC  WBC 4.0 - 10.5 K/uL 9.8  8.7  10.4   Hemoglobin 12.0 - 15.0 g/dL 87.3  87.4  86.3   Hematocrit 36.0 - 46.0 % 38.1  38.8  42.3   Platelets 150 - 400 K/uL 233  260  305    .    Latest Ref Rng & Units 12/03/2023    1:59 PM 06/23/2023   10:42 AM 01/29/2023   10:43 AM  CMP  Glucose 70 - 99 mg/dL 764  797  818   BUN 8 - 23 mg/dL 37  17  33   Creatinine 0.44 - 1.00 mg/dL 8.32  8.60  8.12   Sodium 135 - 145 mmol/L 134  135  135   Potassium 3.5 - 5.1 mmol/L 4.4  3.8  4.6   Chloride 98 - 111 mmol/L 107  106  108   CO2 22 - 32 mmol/L 23  22  20    Calcium  8.9 - 10.3 mg/dL 9.2  9.4  8.9   Total Protein 6.5 - 8.1 g/dL 6.3  6.1  6.8   Total Bilirubin 0.0 - 1.2 mg/dL 0.4  0.5  0.4   Alkaline Phos 38 - 126 U/L 129  92  48   AST 15 - 41 U/L 10  54  11   ALT 0 - 44 U/L 13  93  12    RADIOLOGY  Pet/ct 05/29/2023: IMPRESSION: 1. The mid to distal left femoral shaft focus of hypermetabolism on the prior exam was not included on today's study. If evaluation this area is desired, a whole-body PET would be needed. 2. Otherwise,  left nephrectomy, without metastatic disease. 3. Apparent bladder wall thickening could be due to underdistention and/or cystitis. 4. Incidental findings, including: Coronary artery atherosclerosis. Aortic Atherosclerosis (ICD10-I70.0). Marked pelvic floor laxity. Sinus disease.     Electronically Signed   By: Rockey Kilts M.D.   On: 06/12/2023 09:44    ASSESSMENT and PLAN:   Audrey [name] is a 83 y.o. female who returns for a follow up for metastatic renal cell carcinoma.   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 -Rt adrenal gland bx - showed clear cell RCC -S/p Cytoreductive left radical nephrectomy and left adrenal gland resection on 08/01/2016 by Dr Renda. -Started systemic therapy with Nivolumab  240 mg on 01/02/2018 and Xgeva  on 02/27/2018.  With nivolumab  on 01/01/2023 currently on active surveillance  # Hypothyroidism/Adrenal insufficiency/Diabetes -Continue being followed by Dr. Faythe   #H/o Choledocholithiasis and cholelithiasis -07/10/18 US  Abdomen revealed Biliary duct dilatation with 8 mm calculus at the level of the ampulla in the distal common bile duct. 2. Cholelithiasis. No gallbladder wall thickening or pericholecystic fluid. 3. Appearance of the liver raises concern for underlying hepatic cirrhosis. No focal liver lesions are demonstrable. 4.  Left kidney absent. 5.  Small cysts in right kidney. 6.  Aortic Atherosclerosis. -S/p ERCP on 07/23/18 with Dr. Aloha Mansouraty -09/02/18 Cytopathology from ERCP was suspicious for malignant cells in bile duct, but was not definitive, other two findings were benign.  #Right foot wound 2/2 PAD and DM: --Last  evaluated by wound care from [facility] in [place].  Diabetes type 2 Atrophic urethrovaginitis Hypertension Diabetic neuropathy  PLAN:   - Patient's labs from today were discussed with her in details. - CBC is within  normal limits CMP shows hyperglycemia and chronic kidney disease. TSH within normal limits -Patient has no lab or clinical findings suggest renal cell carcinoma progression at this time. - Patient for additional treatment of the patient's RCC at this time. PCP for continued evaluation and management of chronic medical issues  FOLLOW-UP:  Pet/ct 1st week of January 2026 RTC with Dr Onesimo 2nd week of January 2026   The total time spent in the appointment was 30 minutes*.  All of the patient's questions were answered with apparent satisfaction. The patient knows to call the clinic with any problems, questions or concerns.   Emaline Onesimo MD MS AAHIVMS [facility] Hematology/Oncology Physician [facility]  .*Total Encounter Time as defined by the Centers for Medicare and Medicaid Services includes, in addition to the face-to-face time of a patient visit (documented in the note above) non-face-to-face time: obtaining and reviewing outside history, ordering and reviewing medications, tests or procedures, care coordination (communications with other health care professionals or caregivers) and documentation in the medical record.

## 2023-12-16 ENCOUNTER — Other Ambulatory Visit: Payer: Self-pay

## 2023-12-23 ENCOUNTER — Telehealth: Payer: Self-pay

## 2023-12-23 ENCOUNTER — Other Ambulatory Visit: Payer: Self-pay | Admitting: Vascular Surgery

## 2023-12-23 DIAGNOSIS — I739 Peripheral vascular disease, unspecified: Secondary | ICD-10-CM

## 2023-12-23 DIAGNOSIS — I70229 Atherosclerosis of native arteries of extremities with rest pain, unspecified extremity: Secondary | ICD-10-CM

## 2023-12-23 NOTE — Telephone Encounter (Signed)
 Pt called c/o cold and painful R LE, sore toes  Appt scheduled for ABI and Rt LE Art + PA on 12/24/23

## 2023-12-24 ENCOUNTER — Ambulatory Visit (INDEPENDENT_AMBULATORY_CARE_PROVIDER_SITE_OTHER): Admitting: Physician Assistant

## 2023-12-24 ENCOUNTER — Other Ambulatory Visit: Payer: Self-pay

## 2023-12-24 ENCOUNTER — Ambulatory Visit (HOSPITAL_COMMUNITY)
Admission: RE | Admit: 2023-12-24 | Discharge: 2023-12-24 | Disposition: A | Discharge status: Home / Self Care | Source: Ambulatory Visit | Attending: Vascular Surgery | Admitting: Vascular Surgery

## 2023-12-24 ENCOUNTER — Encounter: Payer: Self-pay | Admitting: Hematology

## 2023-12-24 ENCOUNTER — Ambulatory Visit (HOSPITAL_BASED_OUTPATIENT_CLINIC_OR_DEPARTMENT_OTHER)
Admission: RE | Admit: 2023-12-24 | Discharge: 2023-12-24 | Disposition: A | Discharge status: Home / Self Care | Source: Ambulatory Visit | Attending: Vascular Surgery | Admitting: Vascular Surgery

## 2023-12-24 VITALS — BP 121/71 | HR 62 | Temp 97.7°F | Wt 119.9 lb

## 2023-12-24 DIAGNOSIS — I70229 Atherosclerosis of native arteries of extremities with rest pain, unspecified extremity: Secondary | ICD-10-CM

## 2023-12-24 DIAGNOSIS — I70221 Atherosclerosis of native arteries of extremities with rest pain, right leg: Secondary | ICD-10-CM

## 2023-12-24 DIAGNOSIS — I739 Peripheral vascular disease, unspecified: Secondary | ICD-10-CM | POA: Insufficient documentation

## 2023-12-24 LAB — VAS US ABI WITH/WO TBI
Left ABI: 0.52
Right ABI: 0.38

## 2023-12-24 NOTE — Progress Notes (Signed)
 Office Note     CC:  follow up Requesting Provider:  Irven Ozell DEL, MD  HPI: Audrey [name] is a 83 y.o. ([birth year]) female who presents as an urgent triage add-on for evaluation of cold and painful feeling in her right foot and leg.  She states the symptoms started about 1 to 2 weeks ago.  She has history of right SFA in popliteal artery stenting with balloon angioplasty of the right anterior tibial artery on 10/21/2022 by Dr. Sheree.  This was performed due to right toe and heel wounds.  She reports pain in her foot that keeps her up at night over the past 2 weeks.  She has cramping in her right leg with minimal exertion.  She denies tobacco use.  She is on Plavix and statin daily.  Past medical history significant for diabetes mellitus and CKD.  She also has history of renal cancer with removal of her left kidney.   Past Medical History:  Diagnosis Date   Anemia    Arthritis    lower back, hips, hands   Biliary stricture    Diabetes mellitus (HCC)    type 2    Early cataracts, bilateral    Md just watching   Elevated liver enzymes    Family history of adverse reaction to anesthesia    Daughter hard to wake up   Gallstones    GERD (gastroesophageal reflux disease)    occasional - diet controlled   History of blood transfusion 2018   History of hiatal hernia    HTN (hypertension)    Hyperlipidemia    Hypothyroidism    left renal ca dx'd 2018   renal cancer - left kidney removed, pill chemo x 1 yr   Myocardial infarction (HCC) 1991   no deficits   Peripheral vascular disease (HCC)    SVD (spontaneous vaginal delivery)    x 3   Wears glasses     Past Surgical History:  Procedure Laterality Date   ABDOMINAL AORTOGRAM W/LOWER EXTREMITY N/A 10/21/2022   Procedure: ABDOMINAL AORTOGRAM W/LOWER EXTREMITY;  Surgeon: Sheree Penne Bruckner, MD;  Location: [facility] INVASIVE CV LAB;  Service: Cardiovascular;  Laterality: N/A;   BALLOON DILATION N/A 07/23/2018   Procedure: BALLOON  DILATION;  Surgeon: Wilhelmenia Aloha Raddle., MD;  Location: [facility] ENDOSCOPY;  Service: Gastroenterology;  Laterality: N/A;   BILIARY BRUSHING  08/10/2018   Procedure: BILIARY BRUSHING;  Surgeon: Wilhelmenia Aloha Raddle., MD;  Location: [facility] ENDOSCOPY;  Service: Gastroenterology;;   BILIARY BRUSHING  11/30/2018   Procedure: BILIARY BRUSHING;  Surgeon: Wilhelmenia Aloha Raddle., MD;  Location: [facility] ENDOSCOPY;  Service: Gastroenterology;;   BILIARY DILATION  08/10/2018   Procedure: BILIARY DILATION;  Surgeon: Wilhelmenia Aloha Raddle., MD;  Location: [facility] ENDOSCOPY;  Service: Gastroenterology;;   BILIARY DILATION  08/27/2018   Procedure: BILIARY DILATION;  Surgeon: Wilhelmenia Aloha Raddle., MD;  Location: [facility] ENDOSCOPY;  Service: Gastroenterology;;   BILIARY DILATION  01/24/2020   Procedure: BILIARY DILATION;  Surgeon: Wilhelmenia Aloha Raddle., MD;  Location: [facility] ENDOSCOPY;  Service: Gastroenterology;;   BILIARY STENT PLACEMENT  08/10/2018   Procedure: BILIARY STENT PLACEMENT;  Surgeon: Wilhelmenia Aloha Raddle., MD;  Location: [facility] ENDOSCOPY;  Service: Gastroenterology;;   BILIARY STENT PLACEMENT  08/27/2018   Procedure: BILIARY STENT PLACEMENT;  Surgeon: Wilhelmenia Aloha Raddle., MD;  Location: [facility] ENDOSCOPY;  Service: Gastroenterology;;   BILIARY STENT PLACEMENT  11/30/2018   Procedure: BILIARY STENT PLACEMENT;  Surgeon: Wilhelmenia Aloha Raddle., MD;  Location: [facility] ENDOSCOPY;  Service: Gastroenterology;;  BIOPSY  07/23/2018   Procedure: BIOPSY;  Surgeon: Wilhelmenia Aloha Raddle., MD;  Location: [facility] ENDOSCOPY;  Service: Gastroenterology;;   BIOPSY  01/24/2020   Procedure: BIOPSY;  Surgeon: Wilhelmenia Aloha Raddle., MD;  Location: [facility] ENDOSCOPY;  Service: Gastroenterology;;   CHOLECYSTECTOMY N/A 09/02/2018   Procedure: LAPAROSCOPIC CHOLECYSTECTOMY;  Surgeon: Aron Shoulders, MD;  Location: [facility] OR;  Service: General;  Laterality: N/A;   COLONOSCOPY     normal    ENDOSCOPIC RETROGRADE CHOLANGIOPANCREATOGRAPHY (ERCP) WITH PROPOFOL  N/A  08/10/2018   Procedure: ENDOSCOPIC RETROGRADE CHOLANGIOPANCREATOGRAPHY (ERCP) WITH PROPOFOL ;  Surgeon: Wilhelmenia Aloha Raddle., MD;  Location: [facility] ENDOSCOPY;  Service: Gastroenterology;  Laterality: N/A;   ENDOSCOPIC RETROGRADE CHOLANGIOPANCREATOGRAPHY (ERCP) WITH PROPOFOL  N/A 11/30/2018   Procedure: ENDOSCOPIC RETROGRADE CHOLANGIOPANCREATOGRAPHY (ERCP) WITH PROPOFOL ;  Surgeon: Wilhelmenia Aloha Raddle., MD;  Location: [facility] ENDOSCOPY;  Service: Gastroenterology;  Laterality: N/A;   ENDOSCOPIC RETROGRADE CHOLANGIOPANCREATOGRAPHY (ERCP) WITH PROPOFOL  N/A 01/24/2020   Procedure: ENDOSCOPIC RETROGRADE CHOLANGIOPANCREATOGRAPHY (ERCP) WITH PROPOFOL ;  Surgeon: Wilhelmenia Aloha Raddle., MD;  Location: [facility] ENDOSCOPY;  Service: Gastroenterology;  Laterality: N/A;   ERCP N/A 07/23/2018   Procedure: ENDOSCOPIC RETROGRADE CHOLANGIOPANCREATOGRAPHY (ERCP);  Surgeon: Wilhelmenia Aloha Raddle., MD;  Location: [facility] ENDOSCOPY;  Service: Gastroenterology;  Laterality: N/A;   ERCP N/A 08/27/2018   Procedure: ENDOSCOPIC RETROGRADE CHOLANGIOPANCREATOGRAPHY (ERCP);  Surgeon: Wilhelmenia Aloha Raddle., MD;  Location: [facility] ENDOSCOPY;  Service: Gastroenterology;  Laterality: N/A;   ESOPHAGOGASTRODUODENOSCOPY N/A 01/24/2020   Procedure: ESOPHAGOGASTRODUODENOSCOPY (EGD);  Surgeon: Wilhelmenia Aloha Raddle., MD;  Location: [facility] ENDOSCOPY;  Service: Gastroenterology;  Laterality: N/A;   ESOPHAGOGASTRODUODENOSCOPY (EGD) WITH PROPOFOL  N/A 08/27/2018   Procedure: ESOPHAGOGASTRODUODENOSCOPY (EGD) WITH PROPOFOL ;  Surgeon: Wilhelmenia Aloha Raddle., MD;  Location: [facility] ENDOSCOPY;  Service: Gastroenterology;  Laterality: N/A;   EUS N/A 08/27/2018   Procedure: ESOPHAGEAL ENDOSCOPIC ULTRASOUND (EUS) RADIAL;  Surgeon: Wilhelmenia Aloha Raddle., MD;  Location: [facility] ENDOSCOPY;  Service: Gastroenterology;  Laterality: N/A;   FINE NEEDLE ASPIRATION  08/27/2018   Procedure: FINE NEEDLE ASPIRATION (FNA) LINEAR;  Surgeon: Wilhelmenia Aloha Raddle., MD;  Location: [facility] ENDOSCOPY;  Service:  Gastroenterology;;   SHIRLENE HERNIA REPAIR N/A 06/23/2020   Procedure: OPEN INCISIONAL HERNIA REPAIR WITH MESH;  Surgeon: Lyndel Deward PARAS, MD;  Location: [facility] ORS;  Service: General;  Laterality: N/A;  ROOM 2 STARTING AT 11:00AM FOR 90 MIN   IR IMAGING GUIDED PORT INSERTION  01/08/2018   LAPAROSCOPIC NEPHRECTOMY Left 08/01/2016   Procedure: LAPAROSCOPIC  RADICAL NEPHRECTOMY/ REPAIR OF UMBILICAL HERNIA;  Surgeon: Gretel Ferrara, MD;  Location: [facility] ORS;  Service: Urology;  Laterality: Left;   PERIPHERAL VASCULAR INTERVENTION  10/21/2022   Procedure: PERIPHERAL VASCULAR INTERVENTION;  Surgeon: Sheree Penne Bruckner, MD;  Location: [facility] INVASIVE CV LAB;  Service: Cardiovascular;;   REMOVAL OF STONES  07/23/2018   Procedure: REMOVAL OF GALL STONES;  Surgeon: Wilhelmenia Aloha Raddle., MD;  Location: [facility] ENDOSCOPY;  Service: Gastroenterology;;   REMOVAL OF STONES  08/10/2018   Procedure: REMOVAL OF STONES;  Surgeon: Wilhelmenia Aloha Raddle., MD;  Location: [facility] ENDOSCOPY;  Service: Gastroenterology;;   REMOVAL OF STONES  08/27/2018   Procedure: REMOVAL OF STONES;  Surgeon: Wilhelmenia Aloha Raddle., MD;  Location: [facility] ENDOSCOPY;  Service: Gastroenterology;;   REMOVAL OF STONES  01/24/2020   Procedure: REMOVAL OF STONES;  Surgeon: Wilhelmenia Aloha Raddle., MD;  Location: [facility] ENDOSCOPY;  Service: Gastroenterology;;   ANNETT  07/23/2018   Procedure: ANNETT;  Surgeon: Wilhelmenia Aloha Raddle., MD;  Location: [facility] ENDOSCOPY;  Service: Gastroenterology;;   STENT REMOVAL  08/27/2018   Procedure: CLEDA  REMOVAL;  Surgeon: Wilhelmenia Aloha Raddle., MD;  Location: [facility] ENDOSCOPY;  Service: Gastroenterology;;   CLEDA REMOVAL  11/30/2018   Procedure: STENT REMOVAL;  Surgeon: Wilhelmenia Aloha Raddle., MD;  Location: [facility] ENDOSCOPY;  Service: Gastroenterology;;   CLEDA REMOVAL  01/24/2020   Procedure: STENT REMOVAL;  Surgeon: Wilhelmenia Aloha Raddle., MD;  Location: [facility] ENDOSCOPY;  Service: Gastroenterology;;   UPPER GI ENDOSCOPY      x 1   VAGINAL PROLAPSE REPAIR  11/18/2019   [facility]    Social History   Socioeconomic History   Marital status: Widowed    Spouse name: Not on file   Number of children: 3   Years of education: Not on file   Highest education level: Not on file  Occupational History   Occupation: retired  Tobacco Use   Smoking status: Former    Current packs/day: 0.00    Average packs/day: 1 pack/day for 8.0 years (8.0 ttl pk-yrs)    Types: Cigarettes    Start date: 06/18/1956    Quit date: 06/18/1964    Years since quitting: 59.5   Smokeless tobacco: Never  Vaping Use   Vaping status: Never Used  Substance and Sexual Activity   Alcohol  use: No   Drug use: No   Sexual activity: Not Currently    Birth control/protection: Post-menopausal  Other Topics Concern   Not on file  Social History Narrative   Married.  Three children   Social Drivers of Corporate investment banker Strain: Low Risk  (05/23/2022)   Overall Financial Resource Strain (CARDIA)    Difficulty of Paying Living Expenses: Not hard at all  Food Insecurity: No Food Insecurity (05/23/2022)   Hunger Vital Sign    Worried About Running Out of Food in the Last Year: Never true    Ran Out of Food in the Last Year: Never true  Transportation Needs: No Transportation Needs (05/23/2022)   PRAPARE - Administrator, Civil Service (Medical): No    Lack of Transportation (Non-Medical): No  Physical Activity: Not on file  Stress: Not on file  Social Connections: Not on file  Intimate Partner Violence: Not At Risk (05/23/2022)   Humiliation, Afraid, Rape, and Kick questionnaire    Fear of Current or Ex-Partner: No    Emotionally Abused: No    Physically Abused: No    Sexually Abused: No    Family History  Problem Relation Age of Onset   Heart attack Father 63   Heart disease Brother 37       CABG   Colon cancer Neg Hx    Esophageal cancer Neg Hx    Inflammatory bowel disease Neg Hx    Liver disease Neg Hx     Pancreatic cancer Neg Hx    Rectal cancer Neg Hx    Stomach cancer Neg Hx     Current Outpatient Medications  Medication Sig Dispense Refill   acyclovir  (ZOVIRAX ) 400 MG tablet TAKE 1 TABLET BY MOUTH EVERY DAY 90 tablet 1   amLODipine  (NORVASC ) 5 MG tablet Take 5 mg by mouth daily.   6   atorvastatin  (LIPITOR) 80 MG tablet Take 80 mg by mouth at bedtime.      carvedilol  (COREG ) 6.25 MG tablet Take 6.25 mg by mouth 2 (two) times daily.  6   clindamycin  (CLINDAGEL) 1 % gel APPLY TO AFFECTED AREA TWICE A DAY 30 g 0   clobetasol  ointment (TEMOVATE ) 0.05 % Apply 1 application topically 2 (two) times  daily as needed (lichen sclerosis).      clopidogrel (PLAVIX) 75 MG tablet Take 75 mg by mouth daily.     conjugated estrogens  (PREMARIN ) vaginal cream Place 1 Applicatorful vaginally daily. 60 g 1   dapagliflozin propanediol (FARXIGA) 10 MG TABS tablet Take 10 mg by mouth daily.     denosumab  (XGEVA ) 120 MG/1.7ML SOLN injection Inject 120 mg into the skin every 6 (six) weeks.     diphenhydrAMINE  (BENADRYL ) 25 MG tablet Take 25 mg by mouth at bedtime.     glimepiride (AMARYL) 1 MG tablet Take 1 mg by mouth daily with breakfast.     hydrocortisone  (CORTEF ) 10 MG tablet Take 5-10 mg by mouth See admin instructions. Take 1 tablet (10 mg) by mouth in the morning & 1/2 tablet (5 mg) by mouth in the evening.     ipratropium (ATROVENT HFA) 17 MCG/ACT inhaler Inhale 2 puffs into the lungs every 6 (six) hours as needed for wheezing.     levothyroxine  (SYNTHROID ) 75 MCG tablet Take 75 mcg by mouth daily before breakfast.      lidocaine  (LIDODERM ) 5 % Place 1 patch onto the skin daily as needed. Remove & Discard patch within 12 hours or as directed by MD 30 patch 1   lidocaine  (LMX) 4 % cream Apply 1 Application topically daily as needed (pain).     lidocaine -prilocaine  (EMLA ) cream Apply 1 Application topically every 14 (fourteen) days.     mupirocin  ointment (BACTROBAN ) 2 % Apply topically 2 (two) times daily.  30 g 1   Nivolumab  (OPDIVO  IV) Inject 1 Dose into the vein every 14 (fourteen) days. [facility]     omeprazole  (PRILOSEC) 20 MG capsule Take 20 mg by mouth daily.     repaglinide (PRANDIN) 0.5 MG tablet Take 0.5 mg by mouth 2 (two) times daily.     SSD 1 % cream Apply 1 Application topically daily as needed (Foot infection).     traMADol  (ULTRAM ) 50 MG tablet Take 1 tablet (50 mg total) by mouth every 6 (six) hours as needed. 15 tablet 0   No current facility-administered medications for this visit.    Allergies  Allergen Reactions   Doxycycline Nausea And Vomiting   Adhesive [Tape] Other (See Comments)    Tears skin off Paper tape is ok per patient   Duloxetine Other (See Comments)    Feel woozy   Gabapentin  (Once-Daily) Other (See Comments)    Feels loopy   Nsaids Other (See Comments)    Pt only has one kidney    Statins Other (See Comments)    Liver/kidney issues.     REVIEW OF SYSTEMS:  Negative unless noted in HPI [X]  denotes positive finding, [ ]  denotes negative finding Cardiac  Comments:  Chest pain or chest pressure:    Shortness of breath upon exertion:    Short of breath when lying flat:    Irregular heart rhythm:        Vascular    Pain in calf, thigh, or hip brought on by ambulation:    Pain in feet at night that wakes you up from your sleep:     Blood clot in your veins:    Leg swelling:         Pulmonary    Oxygen at home:    Productive cough:     Wheezing:         Neurologic    Sudden weakness in arms or legs:  Sudden numbness in arms or legs:     Sudden onset of difficulty speaking or slurred speech:    Temporary loss of vision in one eye:     Problems with dizziness:         Gastrointestinal    Blood in stool:     Vomited blood:         Genitourinary    Burning when urinating:     Blood in urine:        Psychiatric    Major depression:         Hematologic    Bleeding problems:    Problems with blood clotting too easily:         Skin    Rashes or ulcers:        Constitutional    Fever or chills:      PHYSICAL EXAMINATION:  Vitals:   12/24/23 1026  BP: 121/71  Pulse: 62  Temp: 97.7 F (36.5 C)  TempSrc: Temporal  Weight: 119 lb 14.4 oz (54.4 kg)    General:  WDWN in NAD; vital signs documented above Gait: Not observed HENT: WNL, normocephalic Pulmonary: normal non-labored breathing Cardiac: regular HR Abdomen: soft, NT, no masses Skin: without rashes Vascular Exam/Pulses: Calcified femoral arteries with a palpable left femoral pulse Extremities: Right great toe dusky in appearance; monophasic soft right peroneal signal by Doppler Musculoskeletal: no muscle wasting or atrophy  Neurologic: A&O X 3 Psychiatric:  The pt has Normal affect.   Non-Invasive Vascular Imaging:   Right SFA and popliteal stents occluded  ABI/TBIToday's ABIToday's TBIPrevious ABIPrevious TBI  +-------+-----------+-----------+------------+------------+  Right 0.38       absent     0.97        0.64          +-------+-----------+-----------+------------+------------+  Left  0.52       0.25       0.61        0.25          +-------+-----------+-----------+------------+------------+     ASSESSMENT/PLAN:: 83 y.o. female who presents to clinic as an urgent triage due to 1 to 2-week history of right foot pain and cold feeling  Ms. Poarch reports 1 to 2 weeks of constant pain in her right foot.  The pain in her foot wakes her up at night consistent with rest pain.  Duplex demonstrates occluded SFA and popliteal stents.  She has a 0.38 ABI with an absent toe pressure.  This represents critical limb ischemia.  She will be scheduled for aortogram with right lower extremity runoff and possible intervention by Dr. Sheree on Monday.  We will try to limit her contrast exposure and use CO2 due to CKD with history of left kidney removal due to renal cancer.  She will continue her Plavix perioperatively.  Risks of  angiography were discussed with the patient however she is aware she is at risk for limb loss without intervention.  She is agreeable to proceed.   Audrey Sender, PA-C [facility] [phone]  Clinic MD:   Sheree

## 2023-12-24 NOTE — H&P (View-Only) (Signed)
 Office Note     CC:  follow up Requesting Provider:  Irven Ozell DEL, MD  HPI: Audrey [name] is a 83 y.o. ([birth year]) female who presents as an urgent triage add-on for evaluation of cold and painful feeling in her right foot and leg.  She states the symptoms started about 1 to 2 weeks ago.  She has history of right SFA in popliteal artery stenting with balloon angioplasty of the right anterior tibial artery on 10/21/2022 by Dr. Sheree.  This was performed due to right toe and heel wounds.  She reports pain in her foot that keeps her up at night over the past 2 weeks.  She has cramping in her right leg with minimal exertion.  She denies tobacco use.  She is on Plavix and statin daily.  Past medical history significant for diabetes mellitus and CKD.  She also has history of renal cancer with removal of her left kidney.   Past Medical History:  Diagnosis Date   Anemia    Arthritis    lower back, hips, hands   Biliary stricture    Diabetes mellitus (HCC)    type 2    Early cataracts, bilateral    Md just watching   Elevated liver enzymes    Family history of adverse reaction to anesthesia    Daughter hard to wake up   Gallstones    GERD (gastroesophageal reflux disease)    occasional - diet controlled   History of blood transfusion 2018   History of hiatal hernia    HTN (hypertension)    Hyperlipidemia    Hypothyroidism    left renal ca dx'd 2018   renal cancer - left kidney removed, pill chemo x 1 yr   Myocardial infarction (HCC) 1991   no deficits   Peripheral vascular disease (HCC)    SVD (spontaneous vaginal delivery)    x 3   Wears glasses     Past Surgical History:  Procedure Laterality Date   ABDOMINAL AORTOGRAM W/LOWER EXTREMITY N/A 10/21/2022   Procedure: ABDOMINAL AORTOGRAM W/LOWER EXTREMITY;  Surgeon: Sheree Penne Bruckner, MD;  Location: [facility] INVASIVE CV LAB;  Service: Cardiovascular;  Laterality: N/A;   BALLOON DILATION N/A 07/23/2018   Procedure: BALLOON  DILATION;  Surgeon: Wilhelmenia Aloha Raddle., MD;  Location: [facility] ENDOSCOPY;  Service: Gastroenterology;  Laterality: N/A;   BILIARY BRUSHING  08/10/2018   Procedure: BILIARY BRUSHING;  Surgeon: Wilhelmenia Aloha Raddle., MD;  Location: [facility] ENDOSCOPY;  Service: Gastroenterology;;   BILIARY BRUSHING  11/30/2018   Procedure: BILIARY BRUSHING;  Surgeon: Wilhelmenia Aloha Raddle., MD;  Location: [facility] ENDOSCOPY;  Service: Gastroenterology;;   BILIARY DILATION  08/10/2018   Procedure: BILIARY DILATION;  Surgeon: Wilhelmenia Aloha Raddle., MD;  Location: [facility] ENDOSCOPY;  Service: Gastroenterology;;   BILIARY DILATION  08/27/2018   Procedure: BILIARY DILATION;  Surgeon: Wilhelmenia Aloha Raddle., MD;  Location: [facility] ENDOSCOPY;  Service: Gastroenterology;;   BILIARY DILATION  01/24/2020   Procedure: BILIARY DILATION;  Surgeon: Wilhelmenia Aloha Raddle., MD;  Location: [facility] ENDOSCOPY;  Service: Gastroenterology;;   BILIARY STENT PLACEMENT  08/10/2018   Procedure: BILIARY STENT PLACEMENT;  Surgeon: Wilhelmenia Aloha Raddle., MD;  Location: [facility] ENDOSCOPY;  Service: Gastroenterology;;   BILIARY STENT PLACEMENT  08/27/2018   Procedure: BILIARY STENT PLACEMENT;  Surgeon: Wilhelmenia Aloha Raddle., MD;  Location: [facility] ENDOSCOPY;  Service: Gastroenterology;;   BILIARY STENT PLACEMENT  11/30/2018   Procedure: BILIARY STENT PLACEMENT;  Surgeon: Wilhelmenia Aloha Raddle., MD;  Location: [facility] ENDOSCOPY;  Service: Gastroenterology;;  BIOPSY  07/23/2018   Procedure: BIOPSY;  Surgeon: Wilhelmenia Aloha Raddle., MD;  Location: [facility] ENDOSCOPY;  Service: Gastroenterology;;   BIOPSY  01/24/2020   Procedure: BIOPSY;  Surgeon: Wilhelmenia Aloha Raddle., MD;  Location: [facility] ENDOSCOPY;  Service: Gastroenterology;;   CHOLECYSTECTOMY N/A 09/02/2018   Procedure: LAPAROSCOPIC CHOLECYSTECTOMY;  Surgeon: Aron Shoulders, MD;  Location: [facility] OR;  Service: General;  Laterality: N/A;   COLONOSCOPY     normal    ENDOSCOPIC RETROGRADE CHOLANGIOPANCREATOGRAPHY (ERCP) WITH PROPOFOL  N/A  08/10/2018   Procedure: ENDOSCOPIC RETROGRADE CHOLANGIOPANCREATOGRAPHY (ERCP) WITH PROPOFOL ;  Surgeon: Wilhelmenia Aloha Raddle., MD;  Location: [facility] ENDOSCOPY;  Service: Gastroenterology;  Laterality: N/A;   ENDOSCOPIC RETROGRADE CHOLANGIOPANCREATOGRAPHY (ERCP) WITH PROPOFOL  N/A 11/30/2018   Procedure: ENDOSCOPIC RETROGRADE CHOLANGIOPANCREATOGRAPHY (ERCP) WITH PROPOFOL ;  Surgeon: Wilhelmenia Aloha Raddle., MD;  Location: [facility] ENDOSCOPY;  Service: Gastroenterology;  Laterality: N/A;   ENDOSCOPIC RETROGRADE CHOLANGIOPANCREATOGRAPHY (ERCP) WITH PROPOFOL  N/A 01/24/2020   Procedure: ENDOSCOPIC RETROGRADE CHOLANGIOPANCREATOGRAPHY (ERCP) WITH PROPOFOL ;  Surgeon: Wilhelmenia Aloha Raddle., MD;  Location: [facility] ENDOSCOPY;  Service: Gastroenterology;  Laterality: N/A;   ERCP N/A 07/23/2018   Procedure: ENDOSCOPIC RETROGRADE CHOLANGIOPANCREATOGRAPHY (ERCP);  Surgeon: Wilhelmenia Aloha Raddle., MD;  Location: [facility] ENDOSCOPY;  Service: Gastroenterology;  Laterality: N/A;   ERCP N/A 08/27/2018   Procedure: ENDOSCOPIC RETROGRADE CHOLANGIOPANCREATOGRAPHY (ERCP);  Surgeon: Wilhelmenia Aloha Raddle., MD;  Location: [facility] ENDOSCOPY;  Service: Gastroenterology;  Laterality: N/A;   ESOPHAGOGASTRODUODENOSCOPY N/A 01/24/2020   Procedure: ESOPHAGOGASTRODUODENOSCOPY (EGD);  Surgeon: Wilhelmenia Aloha Raddle., MD;  Location: [facility] ENDOSCOPY;  Service: Gastroenterology;  Laterality: N/A;   ESOPHAGOGASTRODUODENOSCOPY (EGD) WITH PROPOFOL  N/A 08/27/2018   Procedure: ESOPHAGOGASTRODUODENOSCOPY (EGD) WITH PROPOFOL ;  Surgeon: Wilhelmenia Aloha Raddle., MD;  Location: [facility] ENDOSCOPY;  Service: Gastroenterology;  Laterality: N/A;   EUS N/A 08/27/2018   Procedure: ESOPHAGEAL ENDOSCOPIC ULTRASOUND (EUS) RADIAL;  Surgeon: Wilhelmenia Aloha Raddle., MD;  Location: [facility] ENDOSCOPY;  Service: Gastroenterology;  Laterality: N/A;   FINE NEEDLE ASPIRATION  08/27/2018   Procedure: FINE NEEDLE ASPIRATION (FNA) LINEAR;  Surgeon: Wilhelmenia Aloha Raddle., MD;  Location: [facility] ENDOSCOPY;  Service:  Gastroenterology;;   SHIRLENE HERNIA REPAIR N/A 06/23/2020   Procedure: OPEN INCISIONAL HERNIA REPAIR WITH MESH;  Surgeon: Lyndel Deward PARAS, MD;  Location: [facility] ORS;  Service: General;  Laterality: N/A;  ROOM 2 STARTING AT 11:00AM FOR 90 MIN   IR IMAGING GUIDED PORT INSERTION  01/08/2018   LAPAROSCOPIC NEPHRECTOMY Left 08/01/2016   Procedure: LAPAROSCOPIC  RADICAL NEPHRECTOMY/ REPAIR OF UMBILICAL HERNIA;  Surgeon: Gretel Ferrara, MD;  Location: [facility] ORS;  Service: Urology;  Laterality: Left;   PERIPHERAL VASCULAR INTERVENTION  10/21/2022   Procedure: PERIPHERAL VASCULAR INTERVENTION;  Surgeon: Sheree Penne Bruckner, MD;  Location: [facility] INVASIVE CV LAB;  Service: Cardiovascular;;   REMOVAL OF STONES  07/23/2018   Procedure: REMOVAL OF GALL STONES;  Surgeon: Wilhelmenia Aloha Raddle., MD;  Location: [facility] ENDOSCOPY;  Service: Gastroenterology;;   REMOVAL OF STONES  08/10/2018   Procedure: REMOVAL OF STONES;  Surgeon: Wilhelmenia Aloha Raddle., MD;  Location: [facility] ENDOSCOPY;  Service: Gastroenterology;;   REMOVAL OF STONES  08/27/2018   Procedure: REMOVAL OF STONES;  Surgeon: Wilhelmenia Aloha Raddle., MD;  Location: [facility] ENDOSCOPY;  Service: Gastroenterology;;   REMOVAL OF STONES  01/24/2020   Procedure: REMOVAL OF STONES;  Surgeon: Wilhelmenia Aloha Raddle., MD;  Location: [facility] ENDOSCOPY;  Service: Gastroenterology;;   ANNETT  07/23/2018   Procedure: ANNETT;  Surgeon: Wilhelmenia Aloha Raddle., MD;  Location: [facility] ENDOSCOPY;  Service: Gastroenterology;;   STENT REMOVAL  08/27/2018   Procedure: CLEDA  REMOVAL;  Surgeon: Wilhelmenia Aloha Raddle., MD;  Location: [facility] ENDOSCOPY;  Service: Gastroenterology;;   CLEDA REMOVAL  11/30/2018   Procedure: STENT REMOVAL;  Surgeon: Wilhelmenia Aloha Raddle., MD;  Location: [facility] ENDOSCOPY;  Service: Gastroenterology;;   CLEDA REMOVAL  01/24/2020   Procedure: STENT REMOVAL;  Surgeon: Wilhelmenia Aloha Raddle., MD;  Location: [facility] ENDOSCOPY;  Service: Gastroenterology;;   UPPER GI ENDOSCOPY      x 1   VAGINAL PROLAPSE REPAIR  11/18/2019   [facility]    Social History   Socioeconomic History   Marital status: Widowed    Spouse name: Not on file   Number of children: 3   Years of education: Not on file   Highest education level: Not on file  Occupational History   Occupation: retired  Tobacco Use   Smoking status: Former    Current packs/day: 0.00    Average packs/day: 1 pack/day for 8.0 years (8.0 ttl pk-yrs)    Types: Cigarettes    Start date: 06/18/1956    Quit date: 06/18/1964    Years since quitting: 59.5   Smokeless tobacco: Never  Vaping Use   Vaping status: Never Used  Substance and Sexual Activity   Alcohol  use: No   Drug use: No   Sexual activity: Not Currently    Birth control/protection: Post-menopausal  Other Topics Concern   Not on file  Social History Narrative   Married.  Three children   Social Drivers of Corporate investment banker Strain: Low Risk  (05/23/2022)   Overall Financial Resource Strain (CARDIA)    Difficulty of Paying Living Expenses: Not hard at all  Food Insecurity: No Food Insecurity (05/23/2022)   Hunger Vital Sign    Worried About Running Out of Food in the Last Year: Never true    Ran Out of Food in the Last Year: Never true  Transportation Needs: No Transportation Needs (05/23/2022)   PRAPARE - Administrator, Civil Service (Medical): No    Lack of Transportation (Non-Medical): No  Physical Activity: Not on file  Stress: Not on file  Social Connections: Not on file  Intimate Partner Violence: Not At Risk (05/23/2022)   Humiliation, Afraid, Rape, and Kick questionnaire    Fear of Current or Ex-Partner: No    Emotionally Abused: No    Physically Abused: No    Sexually Abused: No    Family History  Problem Relation Age of Onset   Heart attack Father 63   Heart disease Brother 37       CABG   Colon cancer Neg Hx    Esophageal cancer Neg Hx    Inflammatory bowel disease Neg Hx    Liver disease Neg Hx     Pancreatic cancer Neg Hx    Rectal cancer Neg Hx    Stomach cancer Neg Hx     Current Outpatient Medications  Medication Sig Dispense Refill   acyclovir  (ZOVIRAX ) 400 MG tablet TAKE 1 TABLET BY MOUTH EVERY DAY 90 tablet 1   amLODipine  (NORVASC ) 5 MG tablet Take 5 mg by mouth daily.   6   atorvastatin  (LIPITOR) 80 MG tablet Take 80 mg by mouth at bedtime.      carvedilol  (COREG ) 6.25 MG tablet Take 6.25 mg by mouth 2 (two) times daily.  6   clindamycin  (CLINDAGEL) 1 % gel APPLY TO AFFECTED AREA TWICE A DAY 30 g 0   clobetasol  ointment (TEMOVATE ) 0.05 % Apply 1 application topically 2 (two) times  daily as needed (lichen sclerosis).      clopidogrel (PLAVIX) 75 MG tablet Take 75 mg by mouth daily.     conjugated estrogens  (PREMARIN ) vaginal cream Place 1 Applicatorful vaginally daily. 60 g 1   dapagliflozin propanediol (FARXIGA) 10 MG TABS tablet Take 10 mg by mouth daily.     denosumab  (XGEVA ) 120 MG/1.7ML SOLN injection Inject 120 mg into the skin every 6 (six) weeks.     diphenhydrAMINE  (BENADRYL ) 25 MG tablet Take 25 mg by mouth at bedtime.     glimepiride (AMARYL) 1 MG tablet Take 1 mg by mouth daily with breakfast.     hydrocortisone  (CORTEF ) 10 MG tablet Take 5-10 mg by mouth See admin instructions. Take 1 tablet (10 mg) by mouth in the morning & 1/2 tablet (5 mg) by mouth in the evening.     ipratropium (ATROVENT HFA) 17 MCG/ACT inhaler Inhale 2 puffs into the lungs every 6 (six) hours as needed for wheezing.     levothyroxine  (SYNTHROID ) 75 MCG tablet Take 75 mcg by mouth daily before breakfast.      lidocaine  (LIDODERM ) 5 % Place 1 patch onto the skin daily as needed. Remove & Discard patch within 12 hours or as directed by MD 30 patch 1   lidocaine  (LMX) 4 % cream Apply 1 Application topically daily as needed (pain).     lidocaine -prilocaine  (EMLA ) cream Apply 1 Application topically every 14 (fourteen) days.     mupirocin  ointment (BACTROBAN ) 2 % Apply topically 2 (two) times daily.  30 g 1   Nivolumab  (OPDIVO  IV) Inject 1 Dose into the vein every 14 (fourteen) days. [facility]     omeprazole  (PRILOSEC) 20 MG capsule Take 20 mg by mouth daily.     repaglinide (PRANDIN) 0.5 MG tablet Take 0.5 mg by mouth 2 (two) times daily.     SSD 1 % cream Apply 1 Application topically daily as needed (Foot infection).     traMADol  (ULTRAM ) 50 MG tablet Take 1 tablet (50 mg total) by mouth every 6 (six) hours as needed. 15 tablet 0   No current facility-administered medications for this visit.    Allergies  Allergen Reactions   Doxycycline Nausea And Vomiting   Adhesive [Tape] Other (See Comments)    Tears skin off Paper tape is ok per patient   Duloxetine Other (See Comments)    Feel woozy   Gabapentin  (Once-Daily) Other (See Comments)    Feels loopy   Nsaids Other (See Comments)    Pt only has one kidney    Statins Other (See Comments)    Liver/kidney issues.     REVIEW OF SYSTEMS:  Negative unless noted in HPI [X]  denotes positive finding, [ ]  denotes negative finding Cardiac  Comments:  Chest pain or chest pressure:    Shortness of breath upon exertion:    Short of breath when lying flat:    Irregular heart rhythm:        Vascular    Pain in calf, thigh, or hip brought on by ambulation:    Pain in feet at night that wakes you up from your sleep:     Blood clot in your veins:    Leg swelling:         Pulmonary    Oxygen at home:    Productive cough:     Wheezing:         Neurologic    Sudden weakness in arms or legs:  Sudden numbness in arms or legs:     Sudden onset of difficulty speaking or slurred speech:    Temporary loss of vision in one eye:     Problems with dizziness:         Gastrointestinal    Blood in stool:     Vomited blood:         Genitourinary    Burning when urinating:     Blood in urine:        Psychiatric    Major depression:         Hematologic    Bleeding problems:    Problems with blood clotting too easily:         Skin    Rashes or ulcers:        Constitutional    Fever or chills:      PHYSICAL EXAMINATION:  Vitals:   12/24/23 1026  BP: 121/71  Pulse: 62  Temp: 97.7 F (36.5 C)  TempSrc: Temporal  Weight: 119 lb 14.4 oz (54.4 kg)    General:  WDWN in NAD; vital signs documented above Gait: Not observed HENT: WNL, normocephalic Pulmonary: normal non-labored breathing Cardiac: regular HR Abdomen: soft, NT, no masses Skin: without rashes Vascular Exam/Pulses: Calcified femoral arteries with a palpable left femoral pulse Extremities: Right great toe dusky in appearance; monophasic soft right peroneal signal by Doppler Musculoskeletal: no muscle wasting or atrophy  Neurologic: A&O X 3 Psychiatric:  The pt has Normal affect.   Non-Invasive Vascular Imaging:   Right SFA and popliteal stents occluded  ABI/TBIToday's ABIToday's TBIPrevious ABIPrevious TBI  +-------+-----------+-----------+------------+------------+  Right 0.38       absent     0.97        0.64          +-------+-----------+-----------+------------+------------+  Left  0.52       0.25       0.61        0.25          +-------+-----------+-----------+------------+------------+     ASSESSMENT/PLAN:: 83 y.o. female who presents to clinic as an urgent triage due to 1 to 2-week history of right foot pain and cold feeling  Ms. Poarch reports 1 to 2 weeks of constant pain in her right foot.  The pain in her foot wakes her up at night consistent with rest pain.  Duplex demonstrates occluded SFA and popliteal stents.  She has a 0.38 ABI with an absent toe pressure.  This represents critical limb ischemia.  She will be scheduled for aortogram with right lower extremity runoff and possible intervention by Dr. Sheree on Monday.  We will try to limit her contrast exposure and use CO2 due to CKD with history of left kidney removal due to renal cancer.  She will continue her Plavix perioperatively.  Risks of  angiography were discussed with the patient however she is aware she is at risk for limb loss without intervention.  She is agreeable to proceed.   Donnice Sender, PA-C [facility] [phone]  Clinic MD:   Sheree

## 2023-12-29 ENCOUNTER — Observation Stay (HOSPITAL_COMMUNITY)
Admission: RE | Admit: 2023-12-29 | Discharge: 2023-12-30 | Disposition: A | Discharge status: Home / Self Care | Attending: Vascular Surgery | Admitting: Vascular Surgery

## 2023-12-29 ENCOUNTER — Other Ambulatory Visit: Payer: Self-pay

## 2023-12-29 ENCOUNTER — Encounter (HOSPITAL_COMMUNITY)
Admission: RE | Disposition: A | Discharge status: Home / Self Care | Payer: Self-pay | Source: Home / Self Care | Attending: Vascular Surgery

## 2023-12-29 DIAGNOSIS — Z905 Acquired absence of kidney: Secondary | ICD-10-CM | POA: Diagnosis not present

## 2023-12-29 DIAGNOSIS — N189 Chronic kidney disease, unspecified: Secondary | ICD-10-CM | POA: Diagnosis not present

## 2023-12-29 DIAGNOSIS — E1122 Type 2 diabetes mellitus with diabetic chronic kidney disease: Secondary | ICD-10-CM | POA: Insufficient documentation

## 2023-12-29 DIAGNOSIS — I70221 Atherosclerosis of native arteries of extremities with rest pain, right leg: Secondary | ICD-10-CM | POA: Diagnosis not present

## 2023-12-29 DIAGNOSIS — Z7982 Long term (current) use of aspirin: Secondary | ICD-10-CM | POA: Diagnosis not present

## 2023-12-29 DIAGNOSIS — Z87891 Personal history of nicotine dependence: Secondary | ICD-10-CM | POA: Diagnosis not present

## 2023-12-29 DIAGNOSIS — E1151 Type 2 diabetes mellitus with diabetic peripheral angiopathy without gangrene: Secondary | ICD-10-CM | POA: Insufficient documentation

## 2023-12-29 DIAGNOSIS — Z85528 Personal history of other malignant neoplasm of kidney: Secondary | ICD-10-CM | POA: Insufficient documentation

## 2023-12-29 DIAGNOSIS — I70235 Atherosclerosis of native arteries of right leg with ulceration of other part of foot: Secondary | ICD-10-CM | POA: Diagnosis present

## 2023-12-29 DIAGNOSIS — I739 Peripheral vascular disease, unspecified: Principal | ICD-10-CM | POA: Diagnosis present

## 2023-12-29 DIAGNOSIS — Z79899 Other long term (current) drug therapy: Secondary | ICD-10-CM | POA: Insufficient documentation

## 2023-12-29 DIAGNOSIS — Y832 Surgical operation with anastomosis, bypass or graft as the cause of abnormal reaction of the patient, or of later complication, without mention of misadventure at the time of the procedure: Secondary | ICD-10-CM | POA: Diagnosis not present

## 2023-12-29 DIAGNOSIS — Z7984 Long term (current) use of oral hypoglycemic drugs: Secondary | ICD-10-CM | POA: Insufficient documentation

## 2023-12-29 DIAGNOSIS — Z95820 Peripheral vascular angioplasty status with implants and grafts: Secondary | ICD-10-CM | POA: Insufficient documentation

## 2023-12-29 DIAGNOSIS — T82856A Stenosis of peripheral vascular stent, initial encounter: Secondary | ICD-10-CM | POA: Diagnosis not present

## 2023-12-29 DIAGNOSIS — I129 Hypertensive chronic kidney disease with stage 1 through stage 4 chronic kidney disease, or unspecified chronic kidney disease: Secondary | ICD-10-CM | POA: Diagnosis not present

## 2023-12-29 HISTORY — PX: LOWER EXTREMITY ANGIOGRAPHY: CATH118251

## 2023-12-29 HISTORY — PX: LOWER EXTREMITY INTERVENTION: CATH118252

## 2023-12-29 HISTORY — PX: ABDOMINAL AORTOGRAM: CATH118222

## 2023-12-29 LAB — POCT I-STAT, CHEM 8
BUN: 31 mg/dL — ABNORMAL HIGH (ref 8–23)
Calcium, Ion: 1.17 mmol/L (ref 1.15–1.40)
Chloride: 107 mmol/L (ref 98–111)
Creatinine, Ser: 1.7 mg/dL — ABNORMAL HIGH (ref 0.44–1.00)
Glucose, Bld: 207 mg/dL — ABNORMAL HIGH (ref 70–99)
HCT: 36 % (ref 36.0–46.0)
Hemoglobin: 12.2 g/dL (ref 12.0–15.0)
Potassium: 4.1 mmol/L (ref 3.5–5.1)
Sodium: 138 mmol/L (ref 135–145)
TCO2: 18 mmol/L — ABNORMAL LOW (ref 22–32)

## 2023-12-29 LAB — GLUCOSE, CAPILLARY: Glucose-Capillary: 152 mg/dL — ABNORMAL HIGH (ref 70–99)

## 2023-12-29 LAB — POCT ACTIVATED CLOTTING TIME
Activated Clotting Time: 250 s
Activated Clotting Time: 193 s
Activated Clotting Time: 164 s

## 2023-12-29 SURGERY — ABDOMINAL AORTOGRAM
Anesthesia: LOCAL

## 2023-12-29 MED ORDER — CHLORHEXIDINE GLUCONATE CLOTH 2 % EX PADS
6.0000 | MEDICATED_PAD | Freq: Every day | CUTANEOUS | Status: DC
  Administered 2023-12-30: 6 via TOPICAL

## 2023-12-29 MED ORDER — SODIUM CHLORIDE 0.9% FLUSH
3.0000 mL | Freq: Two times a day (BID) | INTRAVENOUS | Status: DC
Start: 2023-12-30 — End: 2023-12-30
  Administered 2023-12-30: 3 mL via INTRAVENOUS

## 2023-12-29 MED ORDER — AMLODIPINE BESYLATE 5 MG PO TABS
5.0000 mg | ORAL_TABLET | Freq: Every day | ORAL | Status: DC
  Administered 2023-12-30: 5 mg via ORAL
  Filled 2023-12-29: qty 1

## 2023-12-29 MED ORDER — ACYCLOVIR 400 MG PO TABS
400.0000 mg | ORAL_TABLET | Freq: Every day | ORAL | Status: DC
  Administered 2023-12-30: 400 mg via ORAL
  Filled 2023-12-29: qty 1

## 2023-12-29 MED ORDER — SODIUM CHLORIDE 0.9 % IV SOLN: INTRAVENOUS | Status: AC

## 2023-12-29 MED ORDER — LABETALOL HCL 5 MG/ML IV SOLN: 10.0000 mg | INTRAVENOUS | Status: DC | PRN

## 2023-12-29 MED ORDER — IPRATROPIUM BROMIDE 0.02 % IN SOLN: 0.5000 mg | Freq: Four times a day (QID) | RESPIRATORY_TRACT | Status: DC | PRN

## 2023-12-29 MED ORDER — MIDAZOLAM HCL 2 MG/2ML IJ SOLN
INTRAMUSCULAR | Status: DC | PRN
  Administered 2023-12-29 (×2): .5 mg via INTRAVENOUS

## 2023-12-29 MED ORDER — HYDROCORTISONE 5 MG PO TABS
5.0000 mg | ORAL_TABLET | Freq: Every day | ORAL | Status: DC
  Administered 2023-12-29: 5 mg via ORAL
  Filled 2023-12-29 (×2): qty 1

## 2023-12-29 MED ORDER — POTASSIUM CHLORIDE CRYS ER 20 MEQ PO TBCR
20.0000 meq | EXTENDED_RELEASE_TABLET | Freq: Once | ORAL | Status: AC
  Administered 2023-12-29: 40 meq via ORAL
  Filled 2023-12-29: qty 2

## 2023-12-29 MED ORDER — CLOPIDOGREL BISULFATE 75 MG PO TABS: 75.0000 mg | ORAL_TABLET | Freq: Every day | ORAL | Status: DC

## 2023-12-29 MED ORDER — IPRATROPIUM BROMIDE HFA 17 MCG/ACT IN AERS: 2.0000 | INHALATION_SPRAY | Freq: Four times a day (QID) | RESPIRATORY_TRACT | Status: DC | PRN

## 2023-12-29 MED ORDER — NITROGLYCERIN 1 MG/10 ML FOR IR/CATH LAB
INTRA_ARTERIAL | Status: DC | PRN
  Administered 2023-12-29 (×2): 200 ug via INTRA_ARTERIAL

## 2023-12-29 MED ORDER — OXYCODONE-ACETAMINOPHEN 5-325 MG PO TABS
1.0000 | ORAL_TABLET | ORAL | Status: DC | PRN
  Administered 2023-12-30: 1 via ORAL
  Filled 2023-12-29: qty 1

## 2023-12-29 MED ORDER — POLYETHYLENE GLYCOL 3350 17 G PO PACK: 17.0000 g | PACK | Freq: Every day | ORAL | Status: DC | PRN

## 2023-12-29 MED ORDER — LIDOCAINE HCL (PF) 1 % IJ SOLN
INTRAMUSCULAR | Status: AC
  Filled 2023-12-29: qty 30

## 2023-12-29 MED ORDER — LEVOTHYROXINE SODIUM 75 MCG PO TABS
75.0000 ug | ORAL_TABLET | Freq: Every day | ORAL | Status: DC
  Administered 2023-12-30: 75 ug via ORAL
  Filled 2023-12-29: qty 1

## 2023-12-29 MED ORDER — CLOPIDOGREL BISULFATE 75 MG PO TABS
75.0000 mg | ORAL_TABLET | Freq: Every day | ORAL | Status: DC
  Administered 2023-12-30: 75 mg via ORAL
  Filled 2023-12-29: qty 1

## 2023-12-29 MED ORDER — GUAIFENESIN-DM 100-10 MG/5ML PO SYRP: 15.0000 mL | ORAL_SOLUTION | ORAL | Status: DC | PRN

## 2023-12-29 MED ORDER — MIDAZOLAM HCL 2 MG/2ML IJ SOLN
INTRAMUSCULAR | Status: AC
  Filled 2023-12-29: qty 2

## 2023-12-29 MED ORDER — HYDROCORTISONE 5 MG PO TABS
10.0000 mg | ORAL_TABLET | Freq: Every day | ORAL | Status: DC
  Administered 2023-12-30: 10 mg via ORAL
  Filled 2023-12-29: qty 2

## 2023-12-29 MED ORDER — ONDANSETRON HCL 4 MG/2ML IJ SOLN: 4.0000 mg | Freq: Four times a day (QID) | INTRAMUSCULAR | Status: DC | PRN

## 2023-12-29 MED ORDER — MORPHINE SULFATE (PF) 2 MG/ML IV SOLN: 2.0000 mg | INTRAVENOUS | Status: DC | PRN

## 2023-12-29 MED ORDER — HYDROMORPHONE HCL 1 MG/ML IJ SOLN: 0.5000 mg | INTRAMUSCULAR | Status: DC | PRN

## 2023-12-29 MED ORDER — SODIUM CHLORIDE 0.9% FLUSH
10.0000 mL | Freq: Two times a day (BID) | INTRAVENOUS | Status: DC
  Administered 2023-12-29 – 2023-12-30 (×2): 10 mL

## 2023-12-29 MED ORDER — DIPHENHYDRAMINE HCL 25 MG PO CAPS
25.0000 mg | ORAL_CAPSULE | Freq: Every day | ORAL | Status: DC
  Administered 2023-12-29: 25 mg via ORAL
  Filled 2023-12-29 (×4): qty 1

## 2023-12-29 MED ORDER — OXYCODONE HCL 5 MG PO TABS
5.0000 mg | ORAL_TABLET | ORAL | Status: DC | PRN
  Administered 2023-12-29: 5 mg via ORAL
  Filled 2023-12-29: qty 1

## 2023-12-29 MED ORDER — SODIUM CHLORIDE 0.9% FLUSH: 10.0000 mL | INTRAVENOUS | Status: DC | PRN

## 2023-12-29 MED ORDER — ATORVASTATIN CALCIUM 80 MG PO TABS
80.0000 mg | ORAL_TABLET | Freq: Every day | ORAL | Status: DC
  Administered 2023-12-29: 80 mg via ORAL
  Filled 2023-12-29: qty 1

## 2023-12-29 MED ORDER — FENTANYL CITRATE (PF) 100 MCG/2ML IJ SOLN
INTRAMUSCULAR | Status: DC | PRN
  Administered 2023-12-29: 50 ug via INTRAVENOUS
  Administered 2023-12-29 (×2): 25 ug via INTRAVENOUS

## 2023-12-29 MED ORDER — HEPARIN SODIUM (PORCINE) 1000 UNIT/ML IJ SOLN
INTRAMUSCULAR | Status: DC | PRN
  Administered 2023-12-29: 5000 [IU] via INTRAVENOUS
  Administered 2023-12-29: 3000 [IU] via INTRAVENOUS

## 2023-12-29 MED ORDER — ACETAMINOPHEN 325 MG PO TABS: 325.0000 mg | ORAL_TABLET | ORAL | Status: DC | PRN

## 2023-12-29 MED ORDER — SODIUM CHLORIDE 0.9 % IV SOLN: INTRAVENOUS | Status: DC

## 2023-12-29 MED ORDER — LIDOCAINE HCL (PF) 1 % IJ SOLN
INTRAMUSCULAR | Status: DC | PRN
  Administered 2023-12-29: 15 mL

## 2023-12-29 MED ORDER — SODIUM CHLORIDE 0.9 % WEIGHT BASED INFUSION: 1.0000 mL/kg/h | INTRAVENOUS | Status: AC

## 2023-12-29 MED ORDER — FENTANYL CITRATE (PF) 100 MCG/2ML IJ SOLN
INTRAMUSCULAR | Status: AC
  Filled 2023-12-29: qty 2

## 2023-12-29 MED ORDER — ACETAMINOPHEN 325 MG PO TABS: 650.0000 mg | ORAL_TABLET | ORAL | Status: DC | PRN

## 2023-12-29 MED ORDER — MORPHINE SULFATE (PF) 2 MG/ML IV SOLN
INTRAVENOUS | Status: AC
  Administered 2023-12-29: 2 mg via INTRAVENOUS
  Filled 2023-12-29: qty 1

## 2023-12-29 MED ORDER — PANTOPRAZOLE SODIUM 40 MG PO TBEC
40.0000 mg | DELAYED_RELEASE_TABLET | Freq: Every day | ORAL | Status: DC
  Administered 2023-12-30: 40 mg via ORAL
  Filled 2023-12-29: qty 1

## 2023-12-29 MED ORDER — METOPROLOL TARTRATE 5 MG/5ML IV SOLN: 2.0000 mg | INTRAVENOUS | Status: DC | PRN

## 2023-12-29 MED ORDER — ASPIRIN 81 MG PO TBEC: 81.0000 mg | DELAYED_RELEASE_TABLET | Freq: Every day | ORAL | 3 refills | Status: DC

## 2023-12-29 MED ORDER — CARVEDILOL 6.25 MG PO TABS
6.2500 mg | ORAL_TABLET | Freq: Two times a day (BID) | ORAL | Status: DC
  Administered 2023-12-29 – 2023-12-30 (×2): 6.25 mg via ORAL
  Filled 2023-12-29 (×2): qty 1

## 2023-12-29 MED ORDER — PHENOL 1.4 % MT LIQD: 1.0000 | OROMUCOSAL | Status: DC | PRN

## 2023-12-29 MED ORDER — ALUM & MAG HYDROXIDE-SIMETH 200-200-20 MG/5ML PO SUSP: 15.0000 mL | ORAL | Status: DC | PRN

## 2023-12-29 MED ORDER — ASPIRIN 81 MG PO CHEW
CHEWABLE_TABLET | ORAL | Status: DC | PRN
  Administered 2023-12-29: 81 mg via ORAL

## 2023-12-29 MED ORDER — REPAGLINIDE 1 MG PO TABS
0.5000 mg | ORAL_TABLET | Freq: Two times a day (BID) | ORAL | Status: DC
  Administered 2023-12-30: 0.5 mg via ORAL
  Filled 2023-12-29: qty 0.5

## 2023-12-29 MED ORDER — DOCUSATE SODIUM 100 MG PO CAPS
100.0000 mg | ORAL_CAPSULE | Freq: Every day | ORAL | Status: DC
  Administered 2023-12-30: 100 mg via ORAL
  Filled 2023-12-29: qty 1

## 2023-12-29 MED ORDER — HEPARIN SODIUM (PORCINE) 1000 UNIT/ML IJ SOLN
INTRAMUSCULAR | Status: AC
  Filled 2023-12-29: qty 10

## 2023-12-29 MED ORDER — SODIUM CHLORIDE 0.9% FLUSH: 3.0000 mL | INTRAVENOUS | Status: DC | PRN

## 2023-12-29 MED ORDER — ACETAMINOPHEN 325 MG RE SUPP: 325.0000 mg | RECTAL | Status: DC | PRN

## 2023-12-29 MED ORDER — ASPIRIN 81 MG PO CHEW
CHEWABLE_TABLET | ORAL | Status: AC
  Filled 2023-12-29: qty 1

## 2023-12-29 MED ORDER — PANTOPRAZOLE SODIUM 40 MG PO TBEC: 40.0000 mg | DELAYED_RELEASE_TABLET | Freq: Every day | ORAL | Status: DC

## 2023-12-29 MED ORDER — HYDRALAZINE HCL 20 MG/ML IJ SOLN: 5.0000 mg | INTRAMUSCULAR | Status: DC | PRN

## 2023-12-29 MED ORDER — ONDANSETRON HCL 4 MG/2ML IJ SOLN
INTRAMUSCULAR | Status: AC
  Administered 2023-12-29: 4 mg via INTRAVENOUS
  Filled 2023-12-29: qty 2

## 2023-12-29 MED ORDER — HEPARIN (PORCINE) IN NACL 2000-0.9 UNIT/L-% IV SOLN
INTRAVENOUS | Status: DC | PRN
  Administered 2023-12-29: 1000 mL

## 2023-12-29 MED ORDER — IODIXANOL 320 MG/ML IV SOLN
INTRAVENOUS | Status: DC | PRN
  Administered 2023-12-29: 65 mL

## 2023-12-29 MED ORDER — NITROGLYCERIN 1 MG/10 ML FOR IR/CATH LAB
INTRA_ARTERIAL | Status: AC
  Filled 2023-12-29: qty 10

## 2023-12-29 MED ORDER — SODIUM CHLORIDE 0.9 % IV SOLN: 250.0000 mL | INTRAVENOUS | Status: DC | PRN

## 2023-12-29 SURGICAL SUPPLY — 21 items
BALLOON COYOTE OTW 3X60X150 (BALLOONS) IMPLANT
BALLOON STERLING OTW 5X220X150 (BALLOONS) IMPLANT
CATH AURYON ATHERECTOMY 2.0 (CATHETERS) IMPLANT
CATH OMNI FLUSH 5F 65CM (CATHETERS) IMPLANT
CATH QUICKCROSS .035X135CM (MICROCATHETER) IMPLANT
DCB RANGER 5.0X200 150 (BALLOONS) IMPLANT
GLIDEWIRE ADV .035X260CM (WIRE) IMPLANT
KIT ANGIASSIST CO2 SYSTEM (KITS) IMPLANT
KIT ENCORE 26 ADVANTAGE (KITS) IMPLANT
KIT ESSENTIALS PG (KITS) IMPLANT
KIT MICROPUNCTURE NIT STIFF (SHEATH) IMPLANT
KIT SINGLE USE MANIFOLD (KITS) IMPLANT
SET ATX-X65L (MISCELLANEOUS) IMPLANT
SHEATH CATAPULT 6FR 45 (SHEATH) IMPLANT
SHEATH PINNACLE 5F 10CM (SHEATH) IMPLANT
SHEATH PINNACLE 6F 10CM (SHEATH) IMPLANT
SHEATH PROBE COVER 6X72 (BAG) IMPLANT
STENT SYNERGY XD 4.0X38 (Permanent Stent) IMPLANT
TRAY PV CATH (CUSTOM PROCEDURE TRAY) ×1 IMPLANT
WIRE BENTSON .035X145CM (WIRE) IMPLANT
WIRE HI TORQ COMMND ES.014X300 (WIRE) IMPLANT

## 2023-12-29 NOTE — Interval H&P Note (Signed)
 History and Physical Interval Note:  12/29/2023 10:49 AM  [name]  has presented today for surgery, with the diagnosis of atherosclerosis left lower extremity.  The various methods of treatment have been discussed with the patient and family. After consideration of risks, benefits and other options for treatment, the patient has consented to  Procedure(s): ABDOMINAL AORTOGRAM (N/A) Lower Extremity Angiography (N/A) LOWER EXTREMITY INTERVENTION (N/A) as a surgical intervention.  The patient's history has been reviewed, patient examined, no change in status, stable for surgery.  I have reviewed the patient's chart and labs.  Questions were answered to the patient's satisfaction.     Audrey [name]

## 2023-12-29 NOTE — Op Note (Signed)
 Patient name: Audrey [name] MRN: [id number] DOB: March 10, 1941 Sex: female  12/29/2023 Pre-operative Diagnosis: Atherosclerosis native arteries right lower extremity with rest pain and occluded SFA and popliteal stents Post-operative diagnosis:  Same Surgeon:  Penne BROCKS. Sheree, MD Procedure Performed: 1.  Percutaneous ultrasound-guided cannulation left common femoral artery 2.  Catheter in aorta and aortogram with bilateral lower extremity angiography 3.  Catheter selection right peroneal artery 4.  Laser athrectomy right SFA, popliteal and tibioperoneal trunk with 2.0 mm A[place]on 5.  Drug-eluting stent right tibioperoneal trunk with 4 x 38 mm Synergy 6.  Drug-coated balloon angioplasty right SFA popliteal artery stents with 5 mm Ranger 7.  Moderate sedation with fentanyl  and Versed  for 78 minutes   Indications: 83 year old female with history of right SFA and popliteal artery stenting for wounds which have subsequently healed.  She previously had small ulceration of the right great toe she still does have a fungal issue with the right great toe which she had a right heel ulcer which is now healed.  At most recent visit she was noted to have rest pain that is recurrent in the right lower extremity with occlusion of the SFA and popliteal stents.  She is indicated for angiography with possible invention.  Findings: CO2 angiography of the aorta demonstrated patency of the aorta and iliac segments with bilateral common femoral pulses being palpable I elected for no contrast in the large vessels.  The left common femoral, profunda and SFA were patent with contrasted angiography.  The right lower extremity SFA is patent up to the level of the stents which began above the adductor and are occluded all the way through the below the knee.  Initially she had runoff via very diseased tibioperoneal trunk and peroneal artery proximally which runs off very diminutive to the ankle and the anterior tibial artery runs  up to the mid leg and then occludes.  After intervention she had stenting of the tibioperoneal trunk which reduced the stenosis there to 0 and laser arthrectomy and drug-coated balloon angioplasty of the stents reopened the stents with no residual stenosis.  Patient is very high risk for amputation in the future and if the stents occlude again would require repeat angiography with consideration of high risk femoral to peroneal artery bypass.   Procedure:  The patient was identified in the holding area and taken to room 8.  The patient was then placed supine on the table and prepped and draped in the usual sterile fashion.  A time out was called.  Ultrasound was used to evaluate the left common femoral artery.  The artery was diseased there were able to cannulate the anterior wall using micropuncture needle followed by wire sheath.  Skin and subcutaneous tissue around the artery was anesthetized 1% lidocaine  prior to cannulation.  We then placed a micropuncture sheath followed by Bentson wire and a 5 French sheath.  An Omni cath was placed the level of L1 and CO2 aortogram was performed followed by pelvic angiography.  We then crossed the bifurcation initially perform CO2 angiography of the right lower extremity followed by limited contrast and angiography of the right lower extremity.  With the above findings we placed a long Glidewire advantage followed by long 6 French sheath the patient was fully heparinized.  We were able to use a quick cross catheter and Glidewire advantage to direct through the occluded stents and confirmed intraluminal access distally where there was a heavily diseased tibioperoneal trunk into a proximal peroneal artery  which was the dominant runoff and only runoff to the ankle.  The anterior tibial artery was patent up to the mid leg where then occluded this appeared to be chronic in nature given that she did not have any signals preoperatively.  We exchanged for an 014 wire that we  confirmed was in the peroneal artery.  We then began with laser atherectomy beginning from the occluded stents in the SFA and popliteal arteries down to the tibioperoneal trunk.  We then ballooned with a 3 mm balloon distally including the tibioperoneal trunk and a 5 mm balloon of the stents.  Unfortunately the outflow was significantly disadvantage.  We then elected for primarily stenting of the outflow with a Synergy stent.  Completion demonstrated flow into the peroneal artery but there did appear to be spasm distally and nitro was administered to a total of 400 mcg.  We then ballooned the SFA and popliteal stents with drug-coated balloon.  Completion demonstrated brisk flow through the stents however there was still disadvantaged flow distally in the peroneal artery which appeared to be spasm as I prove that this was open with distal catheterization just prior to the completion angiography.  Satisfied that there was no further options for endovascular revascularization I retracted the long sheath into the left extrailiac artery perform retrograde angiography and elected to place a Bentson wire followed by a short 6 French sheath will be pulled holding.  The patient did tolerate the procedure without immediate complication.   Contrast: 65cc  Donta Fuster C. Sheree, MD [facility] [place] Office: [phone] Pager: [phone]

## 2023-12-29 NOTE — Progress Notes (Addendum)
 Site area: L fem artery Site Prior to Removal:  Level 0 Pressure Applied For: Manual:   yes Patient Status During Pull:  Stable Post Pull Site:  Level 0 Post Pull Instructions Given:  yes with teach back Post Pull Pulses Present: 1+ Dressing Applied:  gauze and tegaderm Bedrest begins @ 1730

## 2023-12-29 NOTE — Discharge Instructions (Signed)
 Femoral Site Care This sheet gives you information about how to care for yourself after your procedure. Your health care provider may also give you more specific instructions. If you have problems or questions, contact your health care provider. What can I expect after the procedure?  After the procedure, it is common to have: Bruising that usually fades within 1-2 weeks. Tenderness at the site. Follow these instructions at home: Wound care Follow instructions from your health care provider about how to take care of your insertion site. Make sure you: Wash your hands with soap and water before you change your bandage (dressing). If soap and water are not available, use hand sanitizer. Remove your dressing as told by your health care provider. In 24 hours Do not take baths, swim, or use a hot tub until your health care provider approves. You may shower 24-48 hours after the procedure or as told by your health care provider. Gently wash the site with plain soap and water. Pat the area dry with a clean towel. Do not rub the site. This may cause bleeding. Do not apply powder or lotion to the site. Keep the site clean and dry. Check your femoral site every day for signs of infection. Check for: Redness, swelling, or pain. Fluid or blood. Warmth. Pus or a bad smell. Activity For the first 2-3 days after your procedure, or as long as directed: Avoid climbing stairs as much as possible. Do not squat. Do not lift anything that is heavier than 10 lb (4.5 kg), or the limit that you are told, until your health care provider says that it is safe. For 5 days Rest as directed. Avoid sitting for a long time without moving. Get up to take short walks every 1-2 hours. Do not drive for 24 hours if you were given a medicine to help you relax (sedative). General instructions Take over-the-counter and prescription medicines only as told by your health care provider. Keep all follow-up visits as told by  your health care provider. This is important. Contact a health care provider if you have: A fever or chills. You have redness, swelling, or pain around your insertion site. Get help right away if: The catheter insertion area swells very fast. You pass out. You suddenly start to sweat or your skin gets clammy. The catheter insertion area is bleeding, and the bleeding does not stop when you hold steady pressure on the area. The area near or just beyond the catheter insertion site becomes pale, cool, tingly, or numb. These symptoms may represent a serious problem that is an emergency. Do not wait to see if the symptoms will go away. Get medical help right away. Call your local emergency services (911 in the [place] Do not drive yourself to the hospital. Summary After the procedure, it is common to have bruising that usually fades within 1-2 weeks. Check your femoral site every day for signs of infection. Do not lift anything that is heavier than 10 lb (4.5 kg), or the limit that you are told, until your health care provider says that it is safe. This information is not intended to replace advice given to you by your health care provider. Make sure you discuss any questions you have with your health care provider. Document Revised: 04/21/2017 Document Reviewed: 04/21/2017 Elsevier Patient Education  2020 [employer].

## 2023-12-29 NOTE — Progress Notes (Signed)
 Pt's bedrest to begin at 1730 with plans for her to go short stay (SS) for remainder of bedrest ending at 2130. To allow for pt's discharge home within SS hours, Dr. Sheree paged and made aware of situation. Verbal order given for bedrest to end at 2100 for a 2130 discharge. Nurse communication order placed.

## 2023-12-30 ENCOUNTER — Encounter (HOSPITAL_COMMUNITY): Payer: Self-pay | Admitting: Vascular Surgery

## 2023-12-30 DIAGNOSIS — T82856A Stenosis of peripheral vascular stent, initial encounter: Secondary | ICD-10-CM | POA: Diagnosis not present

## 2023-12-30 DIAGNOSIS — I70221 Atherosclerosis of native arteries of extremities with rest pain, right leg: Secondary | ICD-10-CM | POA: Diagnosis not present

## 2023-12-30 LAB — CBC
HCT: 35.1 % — ABNORMAL LOW (ref 36.0–46.0)
Hemoglobin: 11.7 g/dL — ABNORMAL LOW (ref 12.0–15.0)
MCH: 30.5 pg (ref 26.0–34.0)
MCHC: 33.3 g/dL (ref 30.0–36.0)
MCV: 91.6 fL (ref 80.0–100.0)
Platelets: 324 K/uL (ref 150–400)
RBC: 3.83 MIL/uL — ABNORMAL LOW (ref 3.87–5.11)
RDW: 13.9 % (ref 11.5–15.5)
WBC: 16.8 K/uL — ABNORMAL HIGH (ref 4.0–10.5)
nRBC: 0 % (ref 0.0–0.2)

## 2023-12-30 LAB — BASIC METABOLIC PANEL WITH GFR
Anion gap: 9 (ref 5–15)
BUN: 29 mg/dL — ABNORMAL HIGH (ref 8–23)
CO2: 20 mmol/L — ABNORMAL LOW (ref 22–32)
Calcium: 8.9 mg/dL (ref 8.9–10.3)
Chloride: 106 mmol/L (ref 98–111)
Creatinine, Ser: 1.89 mg/dL — ABNORMAL HIGH (ref 0.44–1.00)
GFR, Estimated: 26 mL/min — ABNORMAL LOW (ref >60)
Glucose, Bld: 236 mg/dL — ABNORMAL HIGH (ref 70–99)
Potassium: 5 mmol/L (ref 3.5–5.1)
Sodium: 135 mmol/L (ref 135–145)

## 2023-12-30 LAB — GLUCOSE, CAPILLARY
Glucose-Capillary: 227 mg/dL — ABNORMAL HIGH (ref 70–99)
Glucose-Capillary: 231 mg/dL — ABNORMAL HIGH (ref 70–99)

## 2023-12-30 MED ORDER — HEPARIN SOD (PORK) LOCK FLUSH 100 UNIT/ML IV SOLN
500.0000 [IU] | INTRAVENOUS | Status: AC | PRN
  Administered 2023-12-30: 500 [IU]

## 2023-12-30 MED ORDER — ASPIRIN 81 MG PO TBEC
81.0000 mg | DELAYED_RELEASE_TABLET | Freq: Every day | ORAL | Status: DC
  Administered 2023-12-30: 81 mg via ORAL
  Filled 2023-12-30: qty 1

## 2023-12-30 MED ORDER — PANTOPRAZOLE SODIUM 20 MG PO TBEC: 20.0000 mg | DELAYED_RELEASE_TABLET | Freq: Every day | ORAL | 1 refills | Status: DC

## 2023-12-30 NOTE — Progress Notes (Addendum)
 Progress Note    12/30/2023 7:50 AM 1 Day Post-Op  Subjective:  feeling better   Vitals:   12/30/23 0048 12/30/23 0413  BP: (!) (P) 120/48 (!) 127/56  Pulse:  83  Resp: (P) 18 18  Temp:  99.5 F (37.5 C)  SpO2:  96%   Physical Exam: Lungs:  non labored Incisions:  L groin bruising but no hematoma Extremities:  feet symmetrically warm Neurologic: A&O  CBC    Component Value Date/Time   WBC 16.8 (H) 12/30/2023 0641   RBC 3.83 (L) 12/30/2023 0641   HGB 11.7 (L) 12/30/2023 0641   HGB 12.6 12/03/2023 1359   HGB 11.2 (L) 04/04/2017 0830   HCT 35.1 (L) 12/30/2023 0641   HCT 35.2 04/04/2017 0830   PLT 324 12/30/2023 0641   PLT 233 12/03/2023 1359   PLT 250 04/04/2017 0830   MCV 91.6 12/30/2023 0641   MCV 107.6 (H) 04/04/2017 0830   MCH 30.5 12/30/2023 0641   MCHC 33.3 12/30/2023 0641   RDW 13.9 12/30/2023 0641   RDW 14.0 04/04/2017 0830   LYMPHSABS 1.0 12/03/2023 1359   LYMPHSABS 1.6 04/04/2017 0830   MONOABS 1.0 12/03/2023 1359   MONOABS 0.4 04/04/2017 0830   EOSABS 0.1 12/03/2023 1359   EOSABS 0.1 04/04/2017 0830   BASOSABS 0.1 12/03/2023 1359   BASOSABS 0.0 04/04/2017 0830    BMET    Component Value Date/Time   NA 135 12/30/2023 0641   NA 140 04/04/2017 0830   K 5.0 12/30/2023 0641   K 4.1 04/04/2017 0830   CL 106 12/30/2023 0641   CO2 20 (L) 12/30/2023 0641   CO2 21 (L) 04/04/2017 0830   GLUCOSE 236 (H) 12/30/2023 0641   GLUCOSE 107 04/04/2017 0830   BUN 29 (H) 12/30/2023 0641   BUN 37.0 (H) 04/04/2017 0830   CREATININE 1.89 (H) 12/30/2023 0641   CREATININE 1.67 (H) 12/03/2023 1359   CREATININE 2.2 (H) 04/04/2017 0830   CALCIUM  8.9 12/30/2023 0641   CALCIUM  10.1 04/04/2017 0830   GFRNONAA 26 (L) 12/30/2023 0641   GFRNONAA 31 (L) 12/03/2023 1359   GFRAA 30 (L) 01/19/2020 0835    INR    Component Value Date/Time   INR 1.1 09/01/2018 1850     Intake/Output Summary (Last 24 hours) at 12/30/2023 0750 Last data filed at 12/29/2023 1800 Gross  per 24 hour  Intake --  Output 300 ml  Net -300 ml     Assessment/Plan:  83 y.o. female is s/p angio with intervention 1 Day Post-Op   L groin bruising but no hematoma Feet symmetrically warm Ok for discharge; office will arrange f/u in 6 weeks with imaging       Donnice Sender, PA-C [facility] [phone] 12/30/2023 7:50 AM  I have independently interviewed and examined patient and agree with PA assessment and plan above.  She does have slight bruising in the left groin but this is soft without evidence of pseudoaneurysm by physical exam.  The right foot is much warmer than preoperative yesterday especially to the level of the ankle and there is a strong but monophasic peroneal signal.  We again discussed that she has only peroneal runoff to the foot as the anterior tibial artery which was previously patent now in the mid leg and that the peroneal artery proximally is stented into the tibioperoneal trunk which extends into the previous SFA and popliteal stents.  If these are to occlude she would be certainly high risk for amputation  in the future and her only revascularization option would be high risk femoral to peroneal artery bypass.  She demonstrates good understanding of this and is okay for discharge today.  Myangel Summons C. Sheree, MD [facility] [place] Office: [phone] Pager: [phone]

## 2023-12-30 NOTE — Plan of Care (Signed)
  Problem: Education: Goal: Knowledge of General Education information will improve Description: Including pain rating scale, medication(s)/side effects and non-pharmacologic comfort measures Outcome: Progressing   Problem: Clinical Measurements: Goal: Diagnostic test results will improve Outcome: Progressing   Problem: Activity: Goal: Ability to return to baseline activity level will improve Outcome: Not Met (add Reason) Note: On-going   Problem: Cardiovascular: Goal: Ability to achieve and maintain adequate cardiovascular perfusion will improve Outcome: Not Met (add Reason) Note: On-going   Problem: Health Behavior/Discharge Planning: Goal: Ability to manage health-related needs will improve Outcome: Not Met (add Reason) Note: On-going

## 2023-12-30 NOTE — TOC CM/SW Note (Signed)
 Transition of Care [facility]) - Inpatient Brief Assessment   Patient Details  Name: Audrey [name] MRN: [id number] Date of Birth: December 06, 1940  [facility]) CM/SW Contact:    Sudie Erminio Deems, RN Phone Number: 12/30/2023, 10:15 AM   Clinical Narrative: Patient presented for abdominal aortogram. PTA patient was from home alone; states granddaughter will be staying with her post hospital. Patient states friends take her to PCP appointments. No home needs identified at this time.    Transition of Care Asessment: Insurance and Status: Insurance coverage has been reviewed Patient has primary care physician: Yes Home environment has been reviewed: reviewed Prior level of function:: independent Prior/Current Home Services: No current home services Social Drivers of Health Review: SDOH reviewed no interventions necessary Readmission risk has been reviewed: Yes Transition of care needs: no transition of care needs at this time

## 2023-12-30 NOTE — Plan of Care (Signed)

## 2023-12-30 NOTE — Progress Notes (Incomplete)
Explained discharge instructions to patient. Reviewed follow up appointment and next medication administration times. Also reviewed education. Patient verbalized having an understanding for instructions given. All belongings are in the patient's possession to include TOC meds. IV and telemetry were removed. CCMD was notified. No other needs verbalized. Transported downstairs for discharge. 

## 2023-12-30 NOTE — Care Management Obs Status (Signed)
 MEDICARE OBSERVATION STATUS NOTIFICATION   Patient Details  Name: Audrey [name] MRN: [id number] Date of Birth: 06/06/1940   Medicare Observation Status Notification Given:  Yes    Vonzell Arrie Sharps 12/30/2023, 8:26 AM

## 2023-12-31 ENCOUNTER — Ambulatory Visit: Admitting: Podiatry

## 2024-01-02 NOTE — Discharge Summary (Signed)
 Physician Discharge Summary  Patient ID: Audrey [name] MRN: [id number] DOB/AGE: March 05, 1941 82 y.o.  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  Percutaneous ultrasound-guided cannulation left common femoral artery 2.  Catheter in aorta and aortogram with bilateral lower extremity angiography 3.  Catheter selection right peroneal artery 4.  Laser athrectomy right SFA, popliteal and tibioperoneal trunk with 2.0 mm Auryon 5.  Drug-eluting stent right tibioperoneal trunk with 4 x 38 mm Synergy 6.  Drug-coated balloon angioplasty right SFA popliteal artery stents with 5 mm Ranger 7.  Moderate sedation with fentanyl  and Versed  for 78 minutes    Discharged Condition: good  Hospital Course: Patient was admitted after angiogram for right lower extremity pain.  The following day her pain was better controlled although still having pain around the right knee from ruptured cyst.  She was able to walk in the hallway and appeared at her medical baseline and was discharged home.  Consults:  None  Significant Diagnostic Studies: CBC    Latest Ref Rng & Units 12/30/2023    6:41 AM 12/29/2023    9:18 AM 12/03/2023    1:59 PM  CBC  WBC 4.0 - 10.5 K/uL 16.8   9.8   Hemoglobin 12.0 - 15.0 g/dL 88.2  87.7  87.3   Hematocrit 36.0 - 46.0 % 35.1  36.0  38.1   Platelets 150 - 400 K/uL 324   233      COAG Lab Results  Component Value Date   INR 1.1 09/01/2018   INR 0.87 01/08/2018   INR 1.04 06/27/2016   No results found for: PTT  Disposition: Discharge disposition: 01-Home or Self Care        Allergies as of 12/30/2023       Reactions   Doxycycline Nausea And  Vomiting   Adhesive [tape] Other (See Comments)   Tears skin off Paper tape is ok per patient   Cymbalta [duloxetine Hcl]    Duloxetine Other (See Comments)   Feel woozy   Gabapentin  (once-daily) Other (See Comments)   Feels loopy   Nsaids Other (See Comments)   Pt only has one kidney    Statins Other (See Comments)   Liver/kidney issues.        Medication List     STOP taking these medications    omeprazole  20 MG capsule Commonly known as: PRILOSEC       TAKE these medications    acidophilus Caps capsule Take by mouth daily.   acyclovir  400 MG tablet Commonly known as: ZOVIRAX  TAKE 1 TABLET BY MOUTH EVERY DAY   amLODipine  5 MG tablet Commonly known as: NORVASC  Take 5 mg by mouth daily.   aspirin  EC 81 MG tablet Take 1 tablet (81 mg total) by mouth daily. Swallow whole.   atorvastatin  80 MG tablet Commonly known as: LIPITOR Take 80 mg by mouth at bedtime.   carvedilol  6.25 MG tablet Commonly known as: COREG  Take 6.25 mg by mouth 2 (two) times daily.   clobetasol  ointment 0.05 % Commonly known as: TEMOVATE  Apply 1 application topically 2 (two) times daily as needed (lichen sclerosis).   clopidogrel  75 MG tablet Commonly known as: PLAVIX  Take 75 mg by mouth at  bedtime.   diphenhydrAMINE  25 MG tablet Commonly known as: BENADRYL  Take 25 mg by mouth at bedtime.   docusate sodium  100 MG capsule Commonly known as: COLACE Take 100 mg by mouth daily.   estradiol 0.1 MG/GM vaginal cream Commonly known as: ESTRACE Place 1 g vaginally 2 (two) times a week.   hydrocortisone  10 MG tablet Commonly known as: CORTEF  Take 5-10 mg by mouth See admin instructions. Take 1 tablet (10 mg) by mouth in the morning & 1/2 tablet (5 mg) by mouth in the evening.   ipratropium 17 MCG/ACT inhaler Commonly known as: ATROVENT  HFA Inhale 2 puffs into the lungs every 6 (six) hours as needed for wheezing.   levothyroxine  75 MCG tablet Commonly known as: SYNTHROID  Take  75 mcg by mouth daily before breakfast.   lidocaine  4 % cream Commonly known as: LMX Apply 1 Application topically daily as needed (pain). What changed: Another medication with the same name was changed. Make sure you understand how and when to take each.   lidocaine  5 % Commonly known as: LIDODERM  Place 1 patch onto the skin daily as needed. Remove & Discard patch within 12 hours or as directed by MD What changed: when to take this   lidocaine -prilocaine  cream Commonly known as: EMLA  Apply 1 Application topically every 3 (three) months.   mupirocin  ointment 2 % Commonly known as: BACTROBAN  Apply topically 2 (two) times daily. What changed:  how much to take when to take this   pantoprazole  20 MG tablet Commonly known as: Protonix  Take 1 tablet (20 mg total) by mouth daily.   repaglinide  0.5 MG tablet Commonly known as: PRANDIN  Take 0.5 mg by mouth 2 (two) times daily.   SSD 1 % cream Generic drug: silver sulfADIAZINE Apply 1 Application topically daily as needed (Foot infection).   traMADol  50 MG tablet Commonly known as: ULTRAM  Take 1 tablet (50 mg total) by mouth every 6 (six) hours as needed. What changed: when to take this        Follow-up Information     [facility] [place] Follow up in 6 week(s).   Specialty: Vascular Surgery Contact information: [address], [address] [place]  [id number] [phone]                Signed: Penne BROCKS. Sheree, MD [facility] [place] Office: [phone] Pager: [phone]   01/02/2024, 9:40 AM

## 2024-01-05 ENCOUNTER — Ambulatory Visit: Admitting: Podiatry

## 2024-01-14 ENCOUNTER — Inpatient Hospital Stay

## 2024-01-22 ENCOUNTER — Other Ambulatory Visit: Payer: Self-pay | Admitting: Vascular Surgery

## 2024-01-22 ENCOUNTER — Other Ambulatory Visit: Payer: Self-pay | Admitting: *Deleted

## 2024-01-22 DIAGNOSIS — I70221 Atherosclerosis of native arteries of extremities with rest pain, right leg: Secondary | ICD-10-CM

## 2024-02-11 ENCOUNTER — Ambulatory Visit (INDEPENDENT_AMBULATORY_CARE_PROVIDER_SITE_OTHER): Admitting: Physician Assistant

## 2024-02-11 ENCOUNTER — Ambulatory Visit (HOSPITAL_COMMUNITY)
Admission: RE | Admit: 2024-02-11 | Discharge: 2024-02-11 | Disposition: A | Discharge status: Home / Self Care | Source: Ambulatory Visit | Attending: Vascular Surgery | Admitting: Vascular Surgery

## 2024-02-11 ENCOUNTER — Ambulatory Visit (HOSPITAL_BASED_OUTPATIENT_CLINIC_OR_DEPARTMENT_OTHER)
Admission: RE | Admit: 2024-02-11 | Discharge: 2024-02-11 | Disposition: A | Discharge status: Home / Self Care | Source: Ambulatory Visit | Attending: Vascular Surgery | Admitting: Vascular Surgery

## 2024-02-11 VITALS — BP 157/79 | HR 79 | Temp 97.9°F | Wt 121.3 lb

## 2024-02-11 DIAGNOSIS — I739 Peripheral vascular disease, unspecified: Secondary | ICD-10-CM

## 2024-02-11 DIAGNOSIS — I70221 Atherosclerosis of native arteries of extremities with rest pain, right leg: Secondary | ICD-10-CM | POA: Diagnosis present

## 2024-02-11 LAB — VAS US ABI WITH/WO TBI
Left ABI: 0.58
Right ABI: 0.96

## 2024-02-11 MED ORDER — PANTOPRAZOLE SODIUM 20 MG PO TBEC: 20.0000 mg | DELAYED_RELEASE_TABLET | Freq: Every day | ORAL | 6 refills | Status: DC

## 2024-02-11 NOTE — Progress Notes (Signed)
 Office Note     CC:  follow up Requesting Provider:  Irven Ozell DEL, MD  HPI: Audrey [name] is a 83 y.o. (1940/09/11) female who presents status post angiogram with laser atherectomy right SFA, popliteal, and TP trunk with drug-eluting stenting of the right TP trunk and drug-coated balloon angioplasty of the right SFA and popliteal artery stents by Dr. Sheree on 12/29/2023.  This was performed due to patient noted to have occluded stents resulting in rest pain of the right foot.  She states her right foot rest pain has resolved.  She denies any claudication.  She is on aspirin  and Plavix  and statin daily.  She is ambulatory with a cane.  She denies any pain to her left groin cath site.   Past Medical History:  Diagnosis Date   Anemia    Arthritis    lower back, hips, hands   Biliary stricture (HCC)    Diabetes mellitus (HCC)    type 2    Early cataracts, bilateral    Md just watching   Elevated liver enzymes    Family history of adverse reaction to anesthesia    Daughter hard to wake up   Gallstones    GERD (gastroesophageal reflux disease)    occasional - diet controlled   History of blood transfusion 2018   History of hiatal hernia    HTN (hypertension)    Hyperlipidemia    Hypothyroidism    left renal ca dx'd 2018   renal cancer - left kidney removed, pill chemo x 1 yr   Myocardial infarction (HCC) 1991   no deficits   Peripheral vascular disease    SVD (spontaneous vaginal delivery)    x 3   Wears glasses     Past Surgical History:  Procedure Laterality Date   ABDOMINAL AORTOGRAM N/A 12/29/2023   Procedure: ABDOMINAL AORTOGRAM;  Surgeon: Sheree Penne Bruckner, MD;  Location: [facility] INVASIVE CV LAB;  Service: Cardiovascular;  Laterality: N/A;   ABDOMINAL AORTOGRAM W/LOWER EXTREMITY N/A 10/21/2022   Procedure: ABDOMINAL AORTOGRAM W/LOWER EXTREMITY;  Surgeon: Sheree Penne Bruckner, MD;  Location: [facility] INVASIVE CV LAB;  Service: Cardiovascular;  Laterality: N/A;   BALLOON  DILATION N/A 07/23/2018   Procedure: BALLOON DILATION;  Surgeon: Wilhelmenia Aloha Raddle., MD;  Location: [facility] ENDOSCOPY;  Service: Gastroenterology;  Laterality: N/A;   BILIARY BRUSHING  08/10/2018   Procedure: BILIARY BRUSHING;  Surgeon: Wilhelmenia Aloha Raddle., MD;  Location: [facility] ENDOSCOPY;  Service: Gastroenterology;;   BILIARY BRUSHING  11/30/2018   Procedure: BILIARY BRUSHING;  Surgeon: Wilhelmenia Aloha Raddle., MD;  Location: [facility] ENDOSCOPY;  Service: Gastroenterology;;   BILIARY DILATION  08/10/2018   Procedure: BILIARY DILATION;  Surgeon: Wilhelmenia Aloha Raddle., MD;  Location: [facility] ENDOSCOPY;  Service: Gastroenterology;;   BILIARY DILATION  08/27/2018   Procedure: BILIARY DILATION;  Surgeon: Wilhelmenia Aloha Raddle., MD;  Location: [facility] ENDOSCOPY;  Service: Gastroenterology;;   BILIARY DILATION  01/24/2020   Procedure: BILIARY DILATION;  Surgeon: Wilhelmenia Aloha Raddle., MD;  Location: [facility] ENDOSCOPY;  Service: Gastroenterology;;   BILIARY STENT PLACEMENT  08/10/2018   Procedure: BILIARY STENT PLACEMENT;  Surgeon: Wilhelmenia Aloha Raddle., MD;  Location: [facility] ENDOSCOPY;  Service: Gastroenterology;;   BILIARY STENT PLACEMENT  08/27/2018   Procedure: BILIARY STENT PLACEMENT;  Surgeon: Wilhelmenia Aloha Raddle., MD;  Location: [facility] ENDOSCOPY;  Service: Gastroenterology;;   BILIARY STENT PLACEMENT  11/30/2018   Procedure: BILIARY STENT PLACEMENT;  Surgeon: Wilhelmenia Aloha Raddle., MD;  Location: [facility] ENDOSCOPY;  Service: Gastroenterology;;   BIOPSY  07/23/2018   Procedure: BIOPSY;  Surgeon: Wilhelmenia Aloha Raddle., MD;  Location: [facility] ENDOSCOPY;  Service: Gastroenterology;;   BIOPSY  01/24/2020   Procedure: BIOPSY;  Surgeon: Wilhelmenia Aloha Raddle., MD;  Location: [facility] ENDOSCOPY;  Service: Gastroenterology;;   CHOLECYSTECTOMY N/A 09/02/2018   Procedure: LAPAROSCOPIC CHOLECYSTECTOMY;  Surgeon: Aron Shoulders, MD;  Location: [facility] OR;  Service: General;  Laterality: N/A;   COLONOSCOPY     normal    ENDOSCOPIC RETROGRADE  CHOLANGIOPANCREATOGRAPHY (ERCP) WITH PROPOFOL  N/A 08/10/2018   Procedure: ENDOSCOPIC RETROGRADE CHOLANGIOPANCREATOGRAPHY (ERCP) WITH PROPOFOL ;  Surgeon: Wilhelmenia Aloha Raddle., MD;  Location: [facility] ENDOSCOPY;  Service: Gastroenterology;  Laterality: N/A;   ENDOSCOPIC RETROGRADE CHOLANGIOPANCREATOGRAPHY (ERCP) WITH PROPOFOL  N/A 11/30/2018   Procedure: ENDOSCOPIC RETROGRADE CHOLANGIOPANCREATOGRAPHY (ERCP) WITH PROPOFOL ;  Surgeon: Wilhelmenia Aloha Raddle., MD;  Location: [facility] ENDOSCOPY;  Service: Gastroenterology;  Laterality: N/A;   ENDOSCOPIC RETROGRADE CHOLANGIOPANCREATOGRAPHY (ERCP) WITH PROPOFOL  N/A 01/24/2020   Procedure: ENDOSCOPIC RETROGRADE CHOLANGIOPANCREATOGRAPHY (ERCP) WITH PROPOFOL ;  Surgeon: Wilhelmenia Aloha Raddle., MD;  Location: [facility] ENDOSCOPY;  Service: Gastroenterology;  Laterality: N/A;   ERCP N/A 07/23/2018   Procedure: ENDOSCOPIC RETROGRADE CHOLANGIOPANCREATOGRAPHY (ERCP);  Surgeon: Wilhelmenia Aloha Raddle., MD;  Location: [facility] ENDOSCOPY;  Service: Gastroenterology;  Laterality: N/A;   ERCP N/A 08/27/2018   Procedure: ENDOSCOPIC RETROGRADE CHOLANGIOPANCREATOGRAPHY (ERCP);  Surgeon: Wilhelmenia Aloha Raddle., MD;  Location: [facility] ENDOSCOPY;  Service: Gastroenterology;  Laterality: N/A;   ESOPHAGOGASTRODUODENOSCOPY N/A 01/24/2020   Procedure: ESOPHAGOGASTRODUODENOSCOPY (EGD);  Surgeon: Wilhelmenia Aloha Raddle., MD;  Location: [facility] ENDOSCOPY;  Service: Gastroenterology;  Laterality: N/A;   ESOPHAGOGASTRODUODENOSCOPY (EGD) WITH PROPOFOL  N/A 08/27/2018   Procedure: ESOPHAGOGASTRODUODENOSCOPY (EGD) WITH PROPOFOL ;  Surgeon: Wilhelmenia Aloha Raddle., MD;  Location: [facility] ENDOSCOPY;  Service: Gastroenterology;  Laterality: N/A;   EUS N/A 08/27/2018   Procedure: ESOPHAGEAL ENDOSCOPIC ULTRASOUND (EUS) RADIAL;  Surgeon: Wilhelmenia Aloha Raddle., MD;  Location: [facility] ENDOSCOPY;  Service: Gastroenterology;  Laterality: N/A;   FINE NEEDLE ASPIRATION  08/27/2018   Procedure: FINE NEEDLE ASPIRATION (FNA) LINEAR;  Surgeon: Wilhelmenia Aloha Raddle., MD;  Location: [facility] ENDOSCOPY;  Service: Gastroenterology;;   SHIRLENE HERNIA REPAIR N/A 06/23/2020   Procedure: OPEN INCISIONAL HERNIA REPAIR WITH MESH;  Surgeon: Lyndel Deward PARAS, MD;  Location: [facility] ORS;  Service: General;  Laterality: N/A;  ROOM 2 STARTING AT 11:00AM FOR 90 MIN   IR IMAGING GUIDED PORT INSERTION  01/08/2018   LAPAROSCOPIC NEPHRECTOMY Left 08/01/2016   Procedure: LAPAROSCOPIC  RADICAL NEPHRECTOMY/ REPAIR OF UMBILICAL HERNIA;  Surgeon: Gretel Ferrara, MD;  Location: [facility] ORS;  Service: Urology;  Laterality: Left;   LOWER EXTREMITY ANGIOGRAPHY N/A 12/29/2023   Procedure: Lower Extremity Angiography;  Surgeon: Sheree Penne Bruckner, MD;  Location: [facility] INVASIVE CV LAB;  Service: Cardiovascular;  Laterality: N/A;   LOWER EXTREMITY INTERVENTION N/A 12/29/2023   Procedure: LOWER EXTREMITY INTERVENTION;  Surgeon: Sheree Penne Bruckner, MD;  Location: [facility] INVASIVE CV LAB;  Service: Cardiovascular;  Laterality: N/A;   PERIPHERAL VASCULAR INTERVENTION  10/21/2022   Procedure: PERIPHERAL VASCULAR INTERVENTION;  Surgeon: Sheree Penne Bruckner, MD;  Location: [facility] INVASIVE CV LAB;  Service: Cardiovascular;;   REMOVAL OF STONES  07/23/2018   Procedure: REMOVAL OF GALL STONES;  Surgeon: Wilhelmenia Aloha Raddle., MD;  Location: [facility] ENDOSCOPY;  Service: Gastroenterology;;   REMOVAL OF STONES  08/10/2018   Procedure: REMOVAL OF STONES;  Surgeon: Wilhelmenia Aloha Raddle., MD;  Location: [facility] ENDOSCOPY;  Service: Gastroenterology;;   REMOVAL OF STONES  08/27/2018   Procedure: REMOVAL OF STONES;  Surgeon: Wilhelmenia Aloha Raddle., MD;  Location: [facility] ENDOSCOPY;  Service: Gastroenterology;;   REMOVAL OF STONES  01/24/2020   Procedure: REMOVAL OF STONES;  Surgeon: Wilhelmenia Aloha Raddle., MD;  Location: [facility] ENDOSCOPY;  Service: Gastroenterology;;   ANNETT  07/23/2018   Procedure: ANNETT;  Surgeon: Mansouraty, Aloha Raddle., MD;  Location: [facility] ENDOSCOPY;  Service: Gastroenterology;;   CLEDA  REMOVAL  08/27/2018   Procedure: STENT REMOVAL;  Surgeon: Wilhelmenia Aloha Raddle., MD;  Location: [facility] ENDOSCOPY;  Service: Gastroenterology;;   CLEDA REMOVAL  11/30/2018   Procedure: STENT REMOVAL;  Surgeon: Wilhelmenia Aloha Raddle., MD;  Location: [facility] ENDOSCOPY;  Service: Gastroenterology;;   CLEDA REMOVAL  01/24/2020   Procedure: STENT REMOVAL;  Surgeon: Wilhelmenia Aloha Raddle., MD;  Location: [facility] ENDOSCOPY;  Service: Gastroenterology;;   UPPER GI ENDOSCOPY     x 1   VAGINAL PROLAPSE REPAIR  11/18/2019   [facility]    Social History   Socioeconomic History   Marital status: Widowed    Spouse name: Not on file   Number of children: 3   Years of education: Not on file   Highest education level: Not on file  Occupational History   Occupation: retired  Tobacco Use   Smoking status: Former    Current packs/day: 0.00    Average packs/day: 1 pack/day for 8.0 years (8.0 ttl pk-yrs)    Types: Cigarettes    Start date: 06/18/1956    Quit date: 06/18/1964    Years since quitting: 59.6   Smokeless tobacco: Never  Vaping Use   Vaping status: Never Used  Substance and Sexual Activity   Alcohol  use: No   Drug use: No   Sexual activity: Not Currently    Birth control/protection: Post-menopausal  Other Topics Concern   Not on file  Social History Narrative   Married.  Three children   Social Drivers of Corporate investment banker Strain: Low Risk  (05/23/2022)   Overall Financial Resource Strain (CARDIA)    Difficulty of Paying Living Expenses: Not hard at all  Food Insecurity: No Food Insecurity (12/29/2023)   Hunger Vital Sign    Worried About Running Out of Food in the Last Year: Never true    Ran Out of Food in the Last Year: Never true  Transportation Needs: No Transportation Needs (12/29/2023)   PRAPARE - Administrator, Civil Service (Medical): No    Lack of Transportation (Non-Medical): No  Physical Activity: Not on file  Stress: Not on file  Social Connections: Socially  Isolated (12/29/2023)   Social Connection and Isolation Panel    Frequency of Communication with Friends and Family: Once a week    Frequency of Social Gatherings with Friends and Family: Once a week    Attends Religious Services: 1 to 4 times per year    Active Member of [employer] or Organizations: No    Attends Banker Meetings: Never    Marital Status: Widowed  Intimate Partner Violence: Not At Risk (12/29/2023)   Humiliation, Afraid, Rape, and Kick questionnaire    Fear of Current or Ex-Partner: No    Emotionally Abused: No    Physically Abused: No    Sexually Abused: No    Family History  Problem Relation Age of Onset   Heart attack Father 66   Heart disease Brother 72       CABG   Colon cancer Neg Hx    Esophageal cancer Neg Hx    Inflammatory bowel disease Neg Hx    Liver disease Neg Hx  Pancreatic cancer Neg Hx    Rectal cancer Neg Hx    Stomach cancer Neg Hx     Current Outpatient Medications  Medication Sig Dispense Refill   fluorouracil (EFUDEX) 5 % cream Apply topically.     acidophilus (RISAQUAD) CAPS capsule Take by mouth daily.     acyclovir  (ZOVIRAX ) 400 MG tablet TAKE 1 TABLET BY MOUTH EVERY DAY 90 tablet 1   amLODipine  (NORVASC ) 5 MG tablet Take 5 mg by mouth daily.  6   aspirin  EC 81 MG tablet Take 1 tablet (81 mg total) by mouth daily. Swallow whole. 90 tablet 3   atorvastatin  (LIPITOR) 80 MG tablet Take 80 mg by mouth at bedtime.      carvedilol  (COREG ) 6.25 MG tablet Take 6.25 mg by mouth 2 (two) times daily.  6   clobetasol  ointment (TEMOVATE ) 0.05 % Apply 1 application topically 2 (two) times daily as needed (lichen sclerosis).      clopidogrel  (PLAVIX ) 75 MG tablet Take 75 mg by mouth at bedtime.     diphenhydrAMINE  (BENADRYL ) 25 MG tablet Take 25 mg by mouth at bedtime.     docusate sodium  (COLACE) 100 MG capsule Take 100 mg by mouth daily.     estradiol (ESTRACE) 0.1 MG/GM vaginal cream Place 1 g vaginally 2 (two) times a week.      hydrocortisone  (CORTEF ) 10 MG tablet Take 5-10 mg by mouth See admin instructions. Take 1 tablet (10 mg) by mouth in the morning & 1/2 tablet (5 mg) by mouth in the evening.     ipratropium (ATROVENT  HFA) 17 MCG/ACT inhaler Inhale 2 puffs into the lungs every 6 (six) hours as needed for wheezing.     levothyroxine  (SYNTHROID ) 75 MCG tablet Take 75 mcg by mouth daily before breakfast.      lidocaine  (LIDODERM ) 5 % Place 1 patch onto the skin daily as needed. Remove & Discard patch within 12 hours or as directed by MD (Patient taking differently: Place 1 patch onto the skin daily. Remove & Discard patch within 12 hours or as directed by MD) 30 patch 1   lidocaine  (LMX) 4 % cream Apply 1 Application topically daily as needed (pain).     lidocaine -prilocaine  (EMLA ) cream Apply 1 Application topically every 3 (three) months.     mupirocin  ointment (BACTROBAN ) 2 % Apply topically 2 (two) times daily. (Patient taking differently: Apply 1 Application topically daily.) 30 g 1   pantoprazole  (PROTONIX ) 20 MG tablet Take 1 tablet (20 mg total) by mouth daily. 30 tablet 6   repaglinide  (PRANDIN ) 0.5 MG tablet Take 0.5 mg by mouth 2 (two) times daily.     SSD 1 % cream Apply 1 Application topically daily as needed (Foot infection).     traMADol  (ULTRAM ) 50 MG tablet Take 1 tablet (50 mg total) by mouth every 6 (six) hours as needed. (Patient taking differently: Take 50 mg by mouth 4 (four) times daily.) 15 tablet 0   No current facility-administered medications for this visit.    Allergies  Allergen Reactions   Doxycycline Nausea And Vomiting   Adhesive [Tape] Other (See Comments)    Tears skin off Paper tape is ok per patient   Cymbalta [Duloxetine Hcl]    Duloxetine Other (See Comments)    Feel woozy   Gabapentin  (Once-Daily) Other (See Comments)    Feels loopy   Nsaids Other (See Comments)    Pt only has one kidney    Statins Other (See Comments)  Liver/kidney issues.     REVIEW OF  SYSTEMS:  Negative unless noted in HPI [X]  denotes positive finding, [ ]  denotes negative finding Cardiac  Comments:  Chest pain or chest pressure:    Shortness of breath upon exertion:    Short of breath when lying flat:    Irregular heart rhythm:        Vascular    Pain in calf, thigh, or hip brought on by ambulation:    Pain in feet at night that wakes you up from your sleep:     Blood clot in your veins:    Leg swelling:         Pulmonary    Oxygen at home:    Productive cough:     Wheezing:         Neurologic    Sudden weakness in arms or legs:     Sudden numbness in arms or legs:     Sudden onset of difficulty speaking or slurred speech:    Temporary loss of vision in one eye:     Problems with dizziness:         Gastrointestinal    Blood in stool:     Vomited blood:         Genitourinary    Burning when urinating:     Blood in urine:        Psychiatric    Major depression:         Hematologic    Bleeding problems:    Problems with blood clotting too easily:        Skin    Rashes or ulcers:        Constitutional    Fever or chills:      PHYSICAL EXAMINATION:  Vitals:   02/11/24 1232  BP: (!) 157/79  Pulse: 79  Temp: 97.9 F (36.6 C)  TempSrc: Temporal  Weight: 121 lb 4.8 oz (55 kg)    General:  WDWN in NAD; vital signs documented above Gait: Not observed HENT: WNL, normocephalic Pulmonary: normal non-labored breathing Cardiac: regular HR Abdomen: soft, NT, no masses Skin: without rashes Vascular Exam/Pulses: brisk R peroneal and DP by doppler Extremities: without ischemic changes, without Gangrene , without cellulitis; without open wounds;  Musculoskeletal: no muscle wasting or atrophy  Neurologic: A&O X 3 Psychiatric:  The pt has Normal affect.   Non-Invasive Vascular Imaging:   Widely patent right SFA and popliteal stenting Widely patent TP trunk  ABI/TBIToday's ABIToday's TBIPrevious ABIPrevious TBI   +-------+-----------+-----------+------------+------------+  Right 0.96       0.40       0.38        0             +-------+-----------+-----------+------------+------------+  Left  0.58       0.32       0.52        0.25            ASSESSMENT/PLAN:: 83 y.o. female status post angiography with laser atherectomy and balloon angioplasty of the right SFA and popliteal artery with drug-eluting stenting of the TP trunk  Rest pain has resolved in the right foot since her procedure.  She now has a well-perfused right lower extremity with brisk flow in the DP and peroneal position.  The left groin cath site has healed without hematoma.  She will continue her aspirin , Plavix , statin daily.  We will repeat right leg arterial duplex and ABIs in 3 months.  She will  notify the office if her symptoms return.   Donnice Sender, PA-C [facility] [phone]  Clinic MD:   Sheree

## 2024-02-12 ENCOUNTER — Other Ambulatory Visit: Payer: Self-pay

## 2024-02-12 ENCOUNTER — Other Ambulatory Visit: Payer: Self-pay | Admitting: *Deleted

## 2024-02-12 DIAGNOSIS — I739 Peripheral vascular disease, unspecified: Secondary | ICD-10-CM

## 2024-02-12 DIAGNOSIS — I70221 Atherosclerosis of native arteries of extremities with rest pain, right leg: Secondary | ICD-10-CM

## 2024-02-18 ENCOUNTER — Encounter: Payer: Self-pay | Admitting: Podiatry

## 2024-02-18 ENCOUNTER — Ambulatory Visit (INDEPENDENT_AMBULATORY_CARE_PROVIDER_SITE_OTHER): Admitting: Podiatry

## 2024-02-18 DIAGNOSIS — M79675 Pain in left toe(s): Secondary | ICD-10-CM | POA: Diagnosis not present

## 2024-02-18 DIAGNOSIS — E1142 Type 2 diabetes mellitus with diabetic polyneuropathy: Secondary | ICD-10-CM

## 2024-02-18 DIAGNOSIS — B351 Tinea unguium: Secondary | ICD-10-CM | POA: Diagnosis not present

## 2024-02-18 DIAGNOSIS — M79674 Pain in right toe(s): Secondary | ICD-10-CM

## 2024-02-18 NOTE — Progress Notes (Signed)
This patient returns to my office for at risk foot care.  This patient requires this care by a professional since this patient will be at risk due to having diabetic neuropathy.  This patient is unable to cut nails herself since the patient cannot reach hernails.These nails are painful walking and wearing shoes.  This patient presents for at risk foot care today. ? ?General Appearance  Alert, conversant and in no acute stress. ? ?Vascular  Dorsalis pedis and posterior tibial  pulses are palpable  bilaterally.  Capillary return is within normal limits  bilaterally. Temperature is within normal limits  bilaterally. ? ?Neurologic  Senn-Weinstein monofilament wire test diminished   bilaterally. Muscle power within normal limits bilaterally. ? ?Nails Thick disfigured discolored nails with subungual debris  from hallux to fifth toes bilaterally. No evidence of bacterial infection or drainage bilaterally. ? ?Orthopedic  No limitations of motion  feet .  No crepitus or effusions noted.  No bony pathology or digital deformities noted. ? ?Skin  normotropic skin with no porokeratosis noted bilaterally.  No signs of infections or ulcers noted.    ? ?Onychomycosis  Pain in right toes  Pain in left toes ? ?Consent was obtained for treatment procedures.   Mechanical debridement of nails 1-5  bilaterally performed with a nail nipper.  Filed with dremel without incident.  ? ? ?Return office visit   3 months                   Told patient to return for periodic foot care and evaluation due to potential at risk complications. ? ? ?Cougar Imel DPM   ?

## 2024-02-19 ENCOUNTER — Other Ambulatory Visit: Payer: Self-pay

## 2024-02-25 ENCOUNTER — Inpatient Hospital Stay: Attending: Hematology

## 2024-02-25 DIAGNOSIS — C642 Malignant neoplasm of left kidney, except renal pelvis: Secondary | ICD-10-CM | POA: Diagnosis not present

## 2024-02-25 DIAGNOSIS — C7951 Secondary malignant neoplasm of bone: Secondary | ICD-10-CM | POA: Diagnosis not present

## 2024-02-25 DIAGNOSIS — Z452 Encounter for adjustment and management of vascular access device: Secondary | ICD-10-CM | POA: Diagnosis present

## 2024-03-31 ENCOUNTER — Telehealth: Payer: Self-pay

## 2024-03-31 ENCOUNTER — Other Ambulatory Visit: Payer: Self-pay | Admitting: Vascular Surgery

## 2024-03-31 DIAGNOSIS — I70229 Atherosclerosis of native arteries of extremities with rest pain, unspecified extremity: Secondary | ICD-10-CM

## 2024-03-31 DIAGNOSIS — I739 Peripheral vascular disease, unspecified: Secondary | ICD-10-CM

## 2024-03-31 DIAGNOSIS — I70221 Atherosclerosis of native arteries of extremities with rest pain, right leg: Secondary | ICD-10-CM

## 2024-03-31 NOTE — Telephone Encounter (Signed)
 Patient called reporting ice cold right foot for the last 3 days, 7/10 pain from the knee down.

## 2024-04-01 ENCOUNTER — Ambulatory Visit (HOSPITAL_COMMUNITY)
Admission: RE | Admit: 2024-04-01 | Discharge: 2024-04-01 | Disposition: A | Discharge status: Home / Self Care | Source: Ambulatory Visit | Attending: Vascular Surgery | Admitting: Vascular Surgery

## 2024-04-01 ENCOUNTER — Ambulatory Visit (HOSPITAL_BASED_OUTPATIENT_CLINIC_OR_DEPARTMENT_OTHER)
Admission: RE | Admit: 2024-04-01 | Discharge: 2024-04-01 | Disposition: A | Discharge status: Home / Self Care | Source: Ambulatory Visit | Attending: Vascular Surgery | Admitting: Vascular Surgery

## 2024-04-01 ENCOUNTER — Encounter: Payer: Self-pay | Admitting: Hematology

## 2024-04-01 ENCOUNTER — Ambulatory Visit: Admitting: Physician Assistant

## 2024-04-01 ENCOUNTER — Other Ambulatory Visit: Payer: Self-pay

## 2024-04-01 VITALS — BP 151/71 | HR 66 | Temp 97.7°F | Wt 121.8 lb

## 2024-04-01 DIAGNOSIS — I70229 Atherosclerosis of native arteries of extremities with rest pain, unspecified extremity: Secondary | ICD-10-CM | POA: Diagnosis not present

## 2024-04-01 DIAGNOSIS — I739 Peripheral vascular disease, unspecified: Secondary | ICD-10-CM | POA: Insufficient documentation

## 2024-04-01 DIAGNOSIS — I70221 Atherosclerosis of native arteries of extremities with rest pain, right leg: Secondary | ICD-10-CM | POA: Diagnosis not present

## 2024-04-01 LAB — VAS US ABI WITH/WO TBI
Left ABI: 0.58
Right ABI: 0

## 2024-04-01 NOTE — Progress Notes (Signed)
 Office Note     CC:  follow up Requesting Provider:  Irven Ozell DEL, MD  HPI: Audrey [name] is a 83 y.o. (1941/03/03) female who presents as an urgent triage add-on patient due to coldness and pain in the right foot.  Surgical history is significant for right SFA stenting in July 2024.  She was noted to have critical limb ischemia with rest pain due to thrombosis of her SFA stents and underwent laser atherectomy, drug-coated balloon angioplasty, and stenting of the right SFA, popliteal, and TP trunk by Dr. Sheree on 12/29/2023.  4 days ago she developed sudden pain and coldness in her right foot.  The pain is severe enough to keep her from walking and wakes her up every 2 hours.  She is still on aspirin  and Plavix  daily.  She is a former smoker.   Past Medical History:  Diagnosis Date   Anemia    Arthritis    lower back, hips, hands   Biliary stricture (HCC)    Diabetes mellitus (HCC)    type 2    Early cataracts, bilateral    Md just watching   Elevated liver enzymes    Family history of adverse reaction to anesthesia    Daughter hard to wake up   Gallstones    GERD (gastroesophageal reflux disease)    occasional - diet controlled   History of blood transfusion 2018   History of hiatal hernia    HTN (hypertension)    Hyperlipidemia    Hypothyroidism    left renal ca dx'd 2018   renal cancer - left kidney removed, pill chemo x 1 yr   Myocardial infarction (HCC) 1991   no deficits   Peripheral vascular disease    SVD (spontaneous vaginal delivery)    x 3   Wears glasses     Past Surgical History:  Procedure Laterality Date   ABDOMINAL AORTOGRAM N/A 12/29/2023   Procedure: ABDOMINAL AORTOGRAM;  Surgeon: Sheree Penne Bruckner, MD;  Location: [facility] INVASIVE CV LAB;  Service: Cardiovascular;  Laterality: N/A;   ABDOMINAL AORTOGRAM W/LOWER EXTREMITY N/A 10/21/2022   Procedure: ABDOMINAL AORTOGRAM W/LOWER EXTREMITY;  Surgeon: Sheree Penne Bruckner, MD;  Location: [facility] INVASIVE CV  LAB;  Service: Cardiovascular;  Laterality: N/A;   BALLOON DILATION N/A 07/23/2018   Procedure: BALLOON DILATION;  Surgeon: Wilhelmenia Aloha Raddle., MD;  Location: [facility] ENDOSCOPY;  Service: Gastroenterology;  Laterality: N/A;   BILIARY BRUSHING  08/10/2018   Procedure: BILIARY BRUSHING;  Surgeon: Wilhelmenia Aloha Raddle., MD;  Location: [facility] ENDOSCOPY;  Service: Gastroenterology;;   BILIARY BRUSHING  11/30/2018   Procedure: BILIARY BRUSHING;  Surgeon: Wilhelmenia Aloha Raddle., MD;  Location: [facility] ENDOSCOPY;  Service: Gastroenterology;;   BILIARY DILATION  08/10/2018   Procedure: BILIARY DILATION;  Surgeon: Wilhelmenia Aloha Raddle., MD;  Location: [facility] ENDOSCOPY;  Service: Gastroenterology;;   BILIARY DILATION  08/27/2018   Procedure: BILIARY DILATION;  Surgeon: Wilhelmenia Aloha Raddle., MD;  Location: [facility] ENDOSCOPY;  Service: Gastroenterology;;   BILIARY DILATION  01/24/2020   Procedure: BILIARY DILATION;  Surgeon: Wilhelmenia Aloha Raddle., MD;  Location: [facility] ENDOSCOPY;  Service: Gastroenterology;;   BILIARY STENT PLACEMENT  08/10/2018   Procedure: BILIARY STENT PLACEMENT;  Surgeon: Wilhelmenia Aloha Raddle., MD;  Location: [facility] ENDOSCOPY;  Service: Gastroenterology;;   BILIARY STENT PLACEMENT  08/27/2018   Procedure: BILIARY STENT PLACEMENT;  Surgeon: Wilhelmenia Aloha Raddle., MD;  Location: [facility] ENDOSCOPY;  Service: Gastroenterology;;   BILIARY STENT PLACEMENT  11/30/2018   Procedure: BILIARY STENT PLACEMENT;  Surgeon: Wilhelmenia Aloha Raddle., MD;  Location: [facility] ENDOSCOPY;  Service: Gastroenterology;;   BIOPSY  07/23/2018   Procedure: BIOPSY;  Surgeon: Wilhelmenia Aloha Raddle., MD;  Location: [facility] ENDOSCOPY;  Service: Gastroenterology;;   BIOPSY  01/24/2020   Procedure: BIOPSY;  Surgeon: Wilhelmenia Aloha Raddle., MD;  Location: [facility] ENDOSCOPY;  Service: Gastroenterology;;   CHOLECYSTECTOMY N/A 09/02/2018   Procedure: LAPAROSCOPIC CHOLECYSTECTOMY;  Surgeon: Aron Shoulders, MD;  Location: [facility] OR;  Service: General;  Laterality: N/A;    COLONOSCOPY     normal    ENDOSCOPIC RETROGRADE CHOLANGIOPANCREATOGRAPHY (ERCP) WITH PROPOFOL  N/A 08/10/2018   Procedure: ENDOSCOPIC RETROGRADE CHOLANGIOPANCREATOGRAPHY (ERCP) WITH PROPOFOL ;  Surgeon: Wilhelmenia Aloha Raddle., MD;  Location: [facility] ENDOSCOPY;  Service: Gastroenterology;  Laterality: N/A;   ENDOSCOPIC RETROGRADE CHOLANGIOPANCREATOGRAPHY (ERCP) WITH PROPOFOL  N/A 11/30/2018   Procedure: ENDOSCOPIC RETROGRADE CHOLANGIOPANCREATOGRAPHY (ERCP) WITH PROPOFOL ;  Surgeon: Wilhelmenia Aloha Raddle., MD;  Location: [facility] ENDOSCOPY;  Service: Gastroenterology;  Laterality: N/A;   ENDOSCOPIC RETROGRADE CHOLANGIOPANCREATOGRAPHY (ERCP) WITH PROPOFOL  N/A 01/24/2020   Procedure: ENDOSCOPIC RETROGRADE CHOLANGIOPANCREATOGRAPHY (ERCP) WITH PROPOFOL ;  Surgeon: Wilhelmenia Aloha Raddle., MD;  Location: [facility] ENDOSCOPY;  Service: Gastroenterology;  Laterality: N/A;   ERCP N/A 07/23/2018   Procedure: ENDOSCOPIC RETROGRADE CHOLANGIOPANCREATOGRAPHY (ERCP);  Surgeon: Wilhelmenia Aloha Raddle., MD;  Location: [facility] ENDOSCOPY;  Service: Gastroenterology;  Laterality: N/A;   ERCP N/A 08/27/2018   Procedure: ENDOSCOPIC RETROGRADE CHOLANGIOPANCREATOGRAPHY (ERCP);  Surgeon: Wilhelmenia Aloha Raddle., MD;  Location: [facility] ENDOSCOPY;  Service: Gastroenterology;  Laterality: N/A;   ESOPHAGOGASTRODUODENOSCOPY N/A 01/24/2020   Procedure: ESOPHAGOGASTRODUODENOSCOPY (EGD);  Surgeon: Wilhelmenia Aloha Raddle., MD;  Location: [facility] ENDOSCOPY;  Service: Gastroenterology;  Laterality: N/A;   ESOPHAGOGASTRODUODENOSCOPY (EGD) WITH PROPOFOL  N/A 08/27/2018   Procedure: ESOPHAGOGASTRODUODENOSCOPY (EGD) WITH PROPOFOL ;  Surgeon: Wilhelmenia Aloha Raddle., MD;  Location: [facility] ENDOSCOPY;  Service: Gastroenterology;  Laterality: N/A;   EUS N/A 08/27/2018   Procedure: ESOPHAGEAL ENDOSCOPIC ULTRASOUND (EUS) RADIAL;  Surgeon: Wilhelmenia Aloha Raddle., MD;  Location: [facility] ENDOSCOPY;  Service: Gastroenterology;  Laterality: N/A;   FINE NEEDLE ASPIRATION  08/27/2018   Procedure: FINE NEEDLE  ASPIRATION (FNA) LINEAR;  Surgeon: Wilhelmenia Aloha Raddle., MD;  Location: [facility] ENDOSCOPY;  Service: Gastroenterology;;   SHIRLENE HERNIA REPAIR N/A 06/23/2020   Procedure: OPEN INCISIONAL HERNIA REPAIR WITH MESH;  Surgeon: Lyndel Deward PARAS, MD;  Location: [facility] ORS;  Service: General;  Laterality: N/A;  ROOM 2 STARTING AT 11:00AM FOR 90 MIN   IR IMAGING GUIDED PORT INSERTION  01/08/2018   LAPAROSCOPIC NEPHRECTOMY Left 08/01/2016   Procedure: LAPAROSCOPIC  RADICAL NEPHRECTOMY/ REPAIR OF UMBILICAL HERNIA;  Surgeon: Gretel Ferrara, MD;  Location: [facility] ORS;  Service: Urology;  Laterality: Left;   LOWER EXTREMITY ANGIOGRAPHY N/A 12/29/2023   Procedure: Lower Extremity Angiography;  Surgeon: Sheree Penne Bruckner, MD;  Location: [facility] INVASIVE CV LAB;  Service: Cardiovascular;  Laterality: N/A;   LOWER EXTREMITY INTERVENTION N/A 12/29/2023   Procedure: LOWER EXTREMITY INTERVENTION;  Surgeon: Sheree Penne Bruckner, MD;  Location: [facility] INVASIVE CV LAB;  Service: Cardiovascular;  Laterality: N/A;   PERIPHERAL VASCULAR INTERVENTION  10/21/2022   Procedure: PERIPHERAL VASCULAR INTERVENTION;  Surgeon: Sheree Penne Bruckner, MD;  Location: [facility] INVASIVE CV LAB;  Service: Cardiovascular;;   REMOVAL OF STONES  07/23/2018   Procedure: REMOVAL OF GALL STONES;  Surgeon: Wilhelmenia Aloha Raddle., MD;  Location: [facility] ENDOSCOPY;  Service: Gastroenterology;;   REMOVAL OF STONES  08/10/2018   Procedure: REMOVAL OF STONES;  Surgeon: Wilhelmenia Aloha Raddle., MD;  Location: [facility] ENDOSCOPY;  Service: Gastroenterology;;   REMOVAL OF STONES  08/27/2018  Procedure: REMOVAL OF STONES;  Surgeon: Mansouraty, Aloha Raddle., MD;  Location: [facility] ENDOSCOPY;  Service: Gastroenterology;;   REMOVAL OF STONES  01/24/2020   Procedure: REMOVAL OF STONES;  Surgeon: Wilhelmenia Aloha Raddle., MD;  Location: [facility] ENDOSCOPY;  Service: Gastroenterology;;   ANNETT  07/23/2018   Procedure: ANNETT;  Surgeon: Wilhelmenia Aloha Raddle., MD;  Location: [facility]  ENDOSCOPY;  Service: Gastroenterology;;   CLEDA REMOVAL  08/27/2018   Procedure: STENT REMOVAL;  Surgeon: Wilhelmenia Aloha Raddle., MD;  Location: [facility] ENDOSCOPY;  Service: Gastroenterology;;   CLEDA REMOVAL  11/30/2018   Procedure: STENT REMOVAL;  Surgeon: Wilhelmenia Aloha Raddle., MD;  Location: [facility] ENDOSCOPY;  Service: Gastroenterology;;   CLEDA REMOVAL  01/24/2020   Procedure: STENT REMOVAL;  Surgeon: Wilhelmenia Aloha Raddle., MD;  Location: [facility] ENDOSCOPY;  Service: Gastroenterology;;   UPPER GI ENDOSCOPY     x 1   VAGINAL PROLAPSE REPAIR  11/18/2019   [facility]    Social History   Socioeconomic History   Marital status: Widowed    Spouse name: Not on file   Number of children: 3   Years of education: Not on file   Highest education level: Not on file  Occupational History   Occupation: retired  Tobacco Use   Smoking status: Former    Current packs/day: 0.00    Average packs/day: 1 pack/day for 8.0 years (8.0 ttl pk-yrs)    Types: Cigarettes    Start date: 06/18/1956    Quit date: 06/18/1964    Years since quitting: 59.8   Smokeless tobacco: Never  Vaping Use   Vaping status: Never Used  Substance and Sexual Activity   Alcohol  use: No   Drug use: No   Sexual activity: Not Currently    Birth control/protection: Post-menopausal  Other Topics Concern   Not on file  Social History Narrative   Married.  Three children   Social Drivers of Health   Tobacco Use: Medium Risk (04/01/2024)   Patient History    Smoking Tobacco Use: Former    Smokeless Tobacco Use: Never    Passive Exposure: Not on file  Financial Resource Strain: Low Risk (05/23/2022)   Overall Financial Resource Strain (CARDIA)    Difficulty of Paying Living Expenses: Not hard at all  Food Insecurity: No Food Insecurity (12/29/2023)   Epic    Worried About Programme Researcher, Broadcasting/film/video in the Last Year: Never true    Ran Out of Food in the Last Year: Never true  Transportation Needs: No Transportation Needs (12/29/2023)   Epic     Lack of Transportation (Medical): No    Lack of Transportation (Non-Medical): No  Physical Activity: Not on file  Stress: Not on file  Social Connections: Socially Isolated (12/29/2023)   Social Connection and Isolation Panel    Frequency of Communication with Friends and Family: Once a week    Frequency of Social Gatherings with Friends and Family: Once a week    Attends Religious Services: 1 to 4 times per year    Active Member of [employer] or Organizations: No    Attends Banker Meetings: Never    Marital Status: Widowed  Intimate Partner Violence: Not At Risk (12/29/2023)   Epic    Fear of Current or Ex-Partner: No    Emotionally Abused: No    Physically Abused: No    Sexually Abused: No  Depression (PHQ2-9): Not on file  Alcohol  Screen: Not on file  Housing: Low Risk (12/29/2023)   Epic  Unable to Pay for Housing in the Last Year: No    Number of Times Moved in the Last Year: 0    Homeless in the Last Year: No  Utilities: Not At Risk (12/29/2023)   Epic    Threatened with loss of utilities: No  Health Literacy: Not on file    Family History  Problem Relation Age of Onset   Heart attack Father 42   Heart disease Brother 81       CABG   Colon cancer Neg Hx    Esophageal cancer Neg Hx    Inflammatory bowel disease Neg Hx    Liver disease Neg Hx    Pancreatic cancer Neg Hx    Rectal cancer Neg Hx    Stomach cancer Neg Hx     Current Outpatient Medications  Medication Sig Dispense Refill   acidophilus (RISAQUAD) CAPS capsule Take by mouth daily.     acyclovir  (ZOVIRAX ) 400 MG tablet TAKE 1 TABLET BY MOUTH EVERY DAY 90 tablet 1   amLODipine  (NORVASC ) 5 MG tablet Take 5 mg by mouth daily.  6   aspirin  EC 81 MG tablet Take 1 tablet (81 mg total) by mouth daily. Swallow whole. 90 tablet 3   atorvastatin  (LIPITOR) 80 MG tablet Take 80 mg by mouth at bedtime.      carvedilol  (COREG ) 6.25 MG tablet Take 6.25 mg by mouth 2 (two) times daily.  6   clobetasol   ointment (TEMOVATE ) 0.05 % Apply 1 application topically 2 (two) times daily as needed (lichen sclerosis).      clopidogrel  (PLAVIX ) 75 MG tablet Take 75 mg by mouth at bedtime.     diphenhydrAMINE  (BENADRYL ) 25 MG tablet Take 25 mg by mouth at bedtime.     docusate sodium  (COLACE) 100 MG capsule Take 100 mg by mouth daily.     estradiol (ESTRACE) 0.1 MG/GM vaginal cream Place 1 g vaginally 2 (two) times a week.     fluorouracil (EFUDEX) 5 % cream Apply topically.     hydrocortisone  (CORTEF ) 10 MG tablet Take 5-10 mg by mouth See admin instructions. Take 1 tablet (10 mg) by mouth in the morning & 1/2 tablet (5 mg) by mouth in the evening.     ipratropium (ATROVENT  HFA) 17 MCG/ACT inhaler Inhale 2 puffs into the lungs every 6 (six) hours as needed for wheezing.     levothyroxine  (SYNTHROID ) 75 MCG tablet Take 75 mcg by mouth daily before breakfast.      lidocaine  (LIDODERM ) 5 % Place 1 patch onto the skin daily as needed. Remove & Discard patch within 12 hours or as directed by MD (Patient taking differently: Place 1 patch onto the skin daily. Remove & Discard patch within 12 hours or as directed by MD) 30 patch 1   lidocaine  (LMX) 4 % cream Apply 1 Application topically daily as needed (pain).     lidocaine -prilocaine  (EMLA ) cream Apply 1 Application topically every 3 (three) months.     mupirocin  ointment (BACTROBAN ) 2 % Apply topically 2 (two) times daily. (Patient taking differently: Apply 1 Application topically daily.) 30 g 1   pantoprazole  (PROTONIX ) 20 MG tablet Take 1 tablet (20 mg total) by mouth daily. 30 tablet 6   repaglinide  (PRANDIN ) 0.5 MG tablet Take 0.5 mg by mouth 2 (two) times daily.     SSD 1 % cream Apply 1 Application topically daily as needed (Foot infection).     traMADol  (ULTRAM ) 50 MG tablet Take 1 tablet (50 mg  total) by mouth every 6 (six) hours as needed. (Patient taking differently: Take 50 mg by mouth 4 (four) times daily.) 15 tablet 0   No current  facility-administered medications for this visit.    Allergies[1]   REVIEW OF SYSTEMS:  Negative unless noted in HPI [X]  denotes positive finding, [ ]  denotes negative finding Cardiac  Comments:  Chest pain or chest pressure:    Shortness of breath upon exertion:    Short of breath when lying flat:    Irregular heart rhythm:        Vascular    Pain in calf, thigh, or hip brought on by ambulation:    Pain in feet at night that wakes you up from your sleep:     Blood clot in your veins:    Leg swelling:         Pulmonary    Oxygen at home:    Productive cough:     Wheezing:         Neurologic    Sudden weakness in arms or legs:     Sudden numbness in arms or legs:     Sudden onset of difficulty speaking or slurred speech:    Temporary loss of vision in one eye:     Problems with dizziness:         Gastrointestinal    Blood in stool:     Vomited blood:         Genitourinary    Burning when urinating:     Blood in urine:        Psychiatric    Major depression:         Hematologic    Bleeding problems:    Problems with blood clotting too easily:        Skin    Rashes or ulcers:        Constitutional    Fever or chills:      PHYSICAL EXAMINATION:  Vitals:   04/01/24 0919  BP: (!) 151/71  Pulse: 66  Temp: 97.7 F (36.5 C)  TempSrc: Temporal  Weight: 121 lb 12.8 oz (55.2 kg)    General:  WDWN in NAD; vital signs documented above Gait: Not observed HENT: WNL, normocephalic Pulmonary: normal non-labored breathing Cardiac: regular HR Abdomen: soft, NT, no masses Skin: without rashes Vascular Exam/Pulses: Venous leg peroneal signal in the mid lower leg Extremities: Right foot is cold to touch; no open wounds; calcified but palpable left femoral pulse Musculoskeletal: no muscle wasting or atrophy  Neurologic: A&O X 3 Psychiatric:  The pt has Normal affect.   Non-Invasive Vascular Imaging:    Occluded SFA, popliteal, and TP trunk stents by  duplex  ABI/TBIToday's ABIToday's TBIPrevious ABIPrevious TBI  +-------+-----------+-----------+------------+------------+  Right 0          0          .96         .40           +-------+-----------+-----------+------------+------------+  Left  .58        .32        .59         .21           +-------+-----------+-----------+------------+------------+    ASSESSMENT/PLAN:: 83 y.o. female added to clinic schedule as an urgent triage patient with right foot coldness and pain  Audrey [name] is an 83 year old female who underwent right SFA stenting in July 2024.  She subsequently thrombosed her SFA stents and underwent  atherectomy and stenting of her right SFA, popliteal, and TP trunk in September of this year.  4 days ago she noticed a sudden pain and coldness in her right foot.  Pain is nonstop and wakes her up every 2 hours overnight.  On exam I can only find a venous like peroneal signal in her mid lower leg.  She states she has a feeling in her foot however her foot feels tight when she is wiggling her toes.  Duplex confirms occluded SFA, popliteal, TP trunk stents with minimal blood flow below this level.  She is aware she is at risk for limb loss and her leg is currently threatened.  She was offered admission to the hospital to initiate IV heparin  prior to performing right lower extremity angiogram.  She is agreeable to having right lower extremity angiogram with Dr. Magda tomorrow with likely plans for bypass surgery with Dr. Sheree next week.  She will be admitted after her angiogram for IV heparin  and likely vein mapping.  She may also benefit from cardiac clearance over the weekend.  The above plan was reviewed in detail with the patient and she and her son are agreeable to proceed.  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  Contrast is necessary but will be limited.  CO2 contrast may also be used during  her procedure.   Audrey Sender, PA-C [facility] [phone]  Clinic MD:   Sheree     [1]  Allergies Allergen Reactions   Doxycycline Nausea And Vomiting   Adhesive [Tape] Other (See Comments)    Tears skin off Paper tape is ok per patient   Cymbalta [Duloxetine Hcl]    Duloxetine Other (See Comments)    Feel woozy   Gabapentin  (Once-Daily) Other (See Comments)    Feels loopy   Nsaids Other (See Comments)    Pt only has one kidney    Statins Other (See Comments)    Liver/kidney issues.

## 2024-04-01 NOTE — H&P (View-Only) (Signed)
 Office Note     CC:  follow up Requesting Provider:  Irven Ozell DEL, MD  HPI: Audrey [name] is a 83 y.o. (1941/03/03) female who presents as an urgent triage add-on patient due to coldness and pain in the right foot.  Surgical history is significant for right SFA stenting in July 2024.  She was noted to have critical limb ischemia with rest pain due to thrombosis of her SFA stents and underwent laser atherectomy, drug-coated balloon angioplasty, and stenting of the right SFA, popliteal, and TP trunk by Dr. Sheree on 12/29/2023.  4 days ago she developed sudden pain and coldness in her right foot.  The pain is severe enough to keep her from walking and wakes her up every 2 hours.  She is still on aspirin  and Plavix  daily.  She is a former smoker.   Past Medical History:  Diagnosis Date   Anemia    Arthritis    lower back, hips, hands   Biliary stricture (HCC)    Diabetes mellitus (HCC)    type 2    Early cataracts, bilateral    Md just watching   Elevated liver enzymes    Family history of adverse reaction to anesthesia    Daughter hard to wake up   Gallstones    GERD (gastroesophageal reflux disease)    occasional - diet controlled   History of blood transfusion 2018   History of hiatal hernia    HTN (hypertension)    Hyperlipidemia    Hypothyroidism    left renal ca dx'd 2018   renal cancer - left kidney removed, pill chemo x 1 yr   Myocardial infarction (HCC) 1991   no deficits   Peripheral vascular disease    SVD (spontaneous vaginal delivery)    x 3   Wears glasses     Past Surgical History:  Procedure Laterality Date   ABDOMINAL AORTOGRAM N/A 12/29/2023   Procedure: ABDOMINAL AORTOGRAM;  Surgeon: Sheree Penne Bruckner, MD;  Location: [facility] INVASIVE CV LAB;  Service: Cardiovascular;  Laterality: N/A;   ABDOMINAL AORTOGRAM W/LOWER EXTREMITY N/A 10/21/2022   Procedure: ABDOMINAL AORTOGRAM W/LOWER EXTREMITY;  Surgeon: Sheree Penne Bruckner, MD;  Location: [facility] INVASIVE CV  LAB;  Service: Cardiovascular;  Laterality: N/A;   BALLOON DILATION N/A 07/23/2018   Procedure: BALLOON DILATION;  Surgeon: Wilhelmenia Aloha Raddle., MD;  Location: [facility] ENDOSCOPY;  Service: Gastroenterology;  Laterality: N/A;   BILIARY BRUSHING  08/10/2018   Procedure: BILIARY BRUSHING;  Surgeon: Wilhelmenia Aloha Raddle., MD;  Location: [facility] ENDOSCOPY;  Service: Gastroenterology;;   BILIARY BRUSHING  11/30/2018   Procedure: BILIARY BRUSHING;  Surgeon: Wilhelmenia Aloha Raddle., MD;  Location: [facility] ENDOSCOPY;  Service: Gastroenterology;;   BILIARY DILATION  08/10/2018   Procedure: BILIARY DILATION;  Surgeon: Wilhelmenia Aloha Raddle., MD;  Location: [facility] ENDOSCOPY;  Service: Gastroenterology;;   BILIARY DILATION  08/27/2018   Procedure: BILIARY DILATION;  Surgeon: Wilhelmenia Aloha Raddle., MD;  Location: [facility] ENDOSCOPY;  Service: Gastroenterology;;   BILIARY DILATION  01/24/2020   Procedure: BILIARY DILATION;  Surgeon: Wilhelmenia Aloha Raddle., MD;  Location: [facility] ENDOSCOPY;  Service: Gastroenterology;;   BILIARY STENT PLACEMENT  08/10/2018   Procedure: BILIARY STENT PLACEMENT;  Surgeon: Wilhelmenia Aloha Raddle., MD;  Location: [facility] ENDOSCOPY;  Service: Gastroenterology;;   BILIARY STENT PLACEMENT  08/27/2018   Procedure: BILIARY STENT PLACEMENT;  Surgeon: Wilhelmenia Aloha Raddle., MD;  Location: [facility] ENDOSCOPY;  Service: Gastroenterology;;   BILIARY STENT PLACEMENT  11/30/2018   Procedure: BILIARY STENT PLACEMENT;  Surgeon: Wilhelmenia Aloha Raddle., MD;  Location: [facility] ENDOSCOPY;  Service: Gastroenterology;;   BIOPSY  07/23/2018   Procedure: BIOPSY;  Surgeon: Wilhelmenia Aloha Raddle., MD;  Location: [facility] ENDOSCOPY;  Service: Gastroenterology;;   BIOPSY  01/24/2020   Procedure: BIOPSY;  Surgeon: Wilhelmenia Aloha Raddle., MD;  Location: [facility] ENDOSCOPY;  Service: Gastroenterology;;   CHOLECYSTECTOMY N/A 09/02/2018   Procedure: LAPAROSCOPIC CHOLECYSTECTOMY;  Surgeon: Aron Shoulders, MD;  Location: [facility] OR;  Service: General;  Laterality: N/A;    COLONOSCOPY     normal    ENDOSCOPIC RETROGRADE CHOLANGIOPANCREATOGRAPHY (ERCP) WITH PROPOFOL  N/A 08/10/2018   Procedure: ENDOSCOPIC RETROGRADE CHOLANGIOPANCREATOGRAPHY (ERCP) WITH PROPOFOL ;  Surgeon: Wilhelmenia Aloha Raddle., MD;  Location: [facility] ENDOSCOPY;  Service: Gastroenterology;  Laterality: N/A;   ENDOSCOPIC RETROGRADE CHOLANGIOPANCREATOGRAPHY (ERCP) WITH PROPOFOL  N/A 11/30/2018   Procedure: ENDOSCOPIC RETROGRADE CHOLANGIOPANCREATOGRAPHY (ERCP) WITH PROPOFOL ;  Surgeon: Wilhelmenia Aloha Raddle., MD;  Location: [facility] ENDOSCOPY;  Service: Gastroenterology;  Laterality: N/A;   ENDOSCOPIC RETROGRADE CHOLANGIOPANCREATOGRAPHY (ERCP) WITH PROPOFOL  N/A 01/24/2020   Procedure: ENDOSCOPIC RETROGRADE CHOLANGIOPANCREATOGRAPHY (ERCP) WITH PROPOFOL ;  Surgeon: Wilhelmenia Aloha Raddle., MD;  Location: [facility] ENDOSCOPY;  Service: Gastroenterology;  Laterality: N/A;   ERCP N/A 07/23/2018   Procedure: ENDOSCOPIC RETROGRADE CHOLANGIOPANCREATOGRAPHY (ERCP);  Surgeon: Wilhelmenia Aloha Raddle., MD;  Location: [facility] ENDOSCOPY;  Service: Gastroenterology;  Laterality: N/A;   ERCP N/A 08/27/2018   Procedure: ENDOSCOPIC RETROGRADE CHOLANGIOPANCREATOGRAPHY (ERCP);  Surgeon: Wilhelmenia Aloha Raddle., MD;  Location: [facility] ENDOSCOPY;  Service: Gastroenterology;  Laterality: N/A;   ESOPHAGOGASTRODUODENOSCOPY N/A 01/24/2020   Procedure: ESOPHAGOGASTRODUODENOSCOPY (EGD);  Surgeon: Wilhelmenia Aloha Raddle., MD;  Location: [facility] ENDOSCOPY;  Service: Gastroenterology;  Laterality: N/A;   ESOPHAGOGASTRODUODENOSCOPY (EGD) WITH PROPOFOL  N/A 08/27/2018   Procedure: ESOPHAGOGASTRODUODENOSCOPY (EGD) WITH PROPOFOL ;  Surgeon: Wilhelmenia Aloha Raddle., MD;  Location: [facility] ENDOSCOPY;  Service: Gastroenterology;  Laterality: N/A;   EUS N/A 08/27/2018   Procedure: ESOPHAGEAL ENDOSCOPIC ULTRASOUND (EUS) RADIAL;  Surgeon: Wilhelmenia Aloha Raddle., MD;  Location: [facility] ENDOSCOPY;  Service: Gastroenterology;  Laterality: N/A;   FINE NEEDLE ASPIRATION  08/27/2018   Procedure: FINE NEEDLE  ASPIRATION (FNA) LINEAR;  Surgeon: Wilhelmenia Aloha Raddle., MD;  Location: [facility] ENDOSCOPY;  Service: Gastroenterology;;   SHIRLENE HERNIA REPAIR N/A 06/23/2020   Procedure: OPEN INCISIONAL HERNIA REPAIR WITH MESH;  Surgeon: Lyndel Deward PARAS, MD;  Location: [facility] ORS;  Service: General;  Laterality: N/A;  ROOM 2 STARTING AT 11:00AM FOR 90 MIN   IR IMAGING GUIDED PORT INSERTION  01/08/2018   LAPAROSCOPIC NEPHRECTOMY Left 08/01/2016   Procedure: LAPAROSCOPIC  RADICAL NEPHRECTOMY/ REPAIR OF UMBILICAL HERNIA;  Surgeon: Gretel Ferrara, MD;  Location: [facility] ORS;  Service: Urology;  Laterality: Left;   LOWER EXTREMITY ANGIOGRAPHY N/A 12/29/2023   Procedure: Lower Extremity Angiography;  Surgeon: Sheree Penne Bruckner, MD;  Location: [facility] INVASIVE CV LAB;  Service: Cardiovascular;  Laterality: N/A;   LOWER EXTREMITY INTERVENTION N/A 12/29/2023   Procedure: LOWER EXTREMITY INTERVENTION;  Surgeon: Sheree Penne Bruckner, MD;  Location: [facility] INVASIVE CV LAB;  Service: Cardiovascular;  Laterality: N/A;   PERIPHERAL VASCULAR INTERVENTION  10/21/2022   Procedure: PERIPHERAL VASCULAR INTERVENTION;  Surgeon: Sheree Penne Bruckner, MD;  Location: [facility] INVASIVE CV LAB;  Service: Cardiovascular;;   REMOVAL OF STONES  07/23/2018   Procedure: REMOVAL OF GALL STONES;  Surgeon: Wilhelmenia Aloha Raddle., MD;  Location: [facility] ENDOSCOPY;  Service: Gastroenterology;;   REMOVAL OF STONES  08/10/2018   Procedure: REMOVAL OF STONES;  Surgeon: Wilhelmenia Aloha Raddle., MD;  Location: [facility] ENDOSCOPY;  Service: Gastroenterology;;   REMOVAL OF STONES  08/27/2018  Procedure: REMOVAL OF STONES;  Surgeon: Mansouraty, Aloha Raddle., MD;  Location: [facility] ENDOSCOPY;  Service: Gastroenterology;;   REMOVAL OF STONES  01/24/2020   Procedure: REMOVAL OF STONES;  Surgeon: Wilhelmenia Aloha Raddle., MD;  Location: [facility] ENDOSCOPY;  Service: Gastroenterology;;   ANNETT  07/23/2018   Procedure: ANNETT;  Surgeon: Wilhelmenia Aloha Raddle., MD;  Location: [facility]  ENDOSCOPY;  Service: Gastroenterology;;   CLEDA REMOVAL  08/27/2018   Procedure: STENT REMOVAL;  Surgeon: Wilhelmenia Aloha Raddle., MD;  Location: [facility] ENDOSCOPY;  Service: Gastroenterology;;   CLEDA REMOVAL  11/30/2018   Procedure: STENT REMOVAL;  Surgeon: Wilhelmenia Aloha Raddle., MD;  Location: [facility] ENDOSCOPY;  Service: Gastroenterology;;   CLEDA REMOVAL  01/24/2020   Procedure: STENT REMOVAL;  Surgeon: Wilhelmenia Aloha Raddle., MD;  Location: [facility] ENDOSCOPY;  Service: Gastroenterology;;   UPPER GI ENDOSCOPY     x 1   VAGINAL PROLAPSE REPAIR  11/18/2019   [facility]    Social History   Socioeconomic History   Marital status: Widowed    Spouse name: Not on file   Number of children: 3   Years of education: Not on file   Highest education level: Not on file  Occupational History   Occupation: retired  Tobacco Use   Smoking status: Former    Current packs/day: 0.00    Average packs/day: 1 pack/day for 8.0 years (8.0 ttl pk-yrs)    Types: Cigarettes    Start date: 06/18/1956    Quit date: 06/18/1964    Years since quitting: 59.8   Smokeless tobacco: Never  Vaping Use   Vaping status: Never Used  Substance and Sexual Activity   Alcohol  use: No   Drug use: No   Sexual activity: Not Currently    Birth control/protection: Post-menopausal  Other Topics Concern   Not on file  Social History Narrative   Married.  Three children   Social Drivers of Health   Tobacco Use: Medium Risk (04/01/2024)   Patient History    Smoking Tobacco Use: Former    Smokeless Tobacco Use: Never    Passive Exposure: Not on file  Financial Resource Strain: Low Risk (05/23/2022)   Overall Financial Resource Strain (CARDIA)    Difficulty of Paying Living Expenses: Not hard at all  Food Insecurity: No Food Insecurity (12/29/2023)   Epic    Worried About Programme Researcher, Broadcasting/film/video in the Last Year: Never true    Ran Out of Food in the Last Year: Never true  Transportation Needs: No Transportation Needs (12/29/2023)   Epic     Lack of Transportation (Medical): No    Lack of Transportation (Non-Medical): No  Physical Activity: Not on file  Stress: Not on file  Social Connections: Socially Isolated (12/29/2023)   Social Connection and Isolation Panel    Frequency of Communication with Friends and Family: Once a week    Frequency of Social Gatherings with Friends and Family: Once a week    Attends Religious Services: 1 to 4 times per year    Active Member of [employer] or Organizations: No    Attends Banker Meetings: Never    Marital Status: Widowed  Intimate Partner Violence: Not At Risk (12/29/2023)   Epic    Fear of Current or Ex-Partner: No    Emotionally Abused: No    Physically Abused: No    Sexually Abused: No  Depression (PHQ2-9): Not on file  Alcohol  Screen: Not on file  Housing: Low Risk (12/29/2023)   Epic  Unable to Pay for Housing in the Last Year: No    Number of Times Moved in the Last Year: 0    Homeless in the Last Year: No  Utilities: Not At Risk (12/29/2023)   Epic    Threatened with loss of utilities: No  Health Literacy: Not on file    Family History  Problem Relation Age of Onset   Heart attack Father 42   Heart disease Brother 81       CABG   Colon cancer Neg Hx    Esophageal cancer Neg Hx    Inflammatory bowel disease Neg Hx    Liver disease Neg Hx    Pancreatic cancer Neg Hx    Rectal cancer Neg Hx    Stomach cancer Neg Hx     Current Outpatient Medications  Medication Sig Dispense Refill   acidophilus (RISAQUAD) CAPS capsule Take by mouth daily.     acyclovir  (ZOVIRAX ) 400 MG tablet TAKE 1 TABLET BY MOUTH EVERY DAY 90 tablet 1   amLODipine  (NORVASC ) 5 MG tablet Take 5 mg by mouth daily.  6   aspirin  EC 81 MG tablet Take 1 tablet (81 mg total) by mouth daily. Swallow whole. 90 tablet 3   atorvastatin  (LIPITOR) 80 MG tablet Take 80 mg by mouth at bedtime.      carvedilol  (COREG ) 6.25 MG tablet Take 6.25 mg by mouth 2 (two) times daily.  6   clobetasol   ointment (TEMOVATE ) 0.05 % Apply 1 application topically 2 (two) times daily as needed (lichen sclerosis).      clopidogrel  (PLAVIX ) 75 MG tablet Take 75 mg by mouth at bedtime.     diphenhydrAMINE  (BENADRYL ) 25 MG tablet Take 25 mg by mouth at bedtime.     docusate sodium  (COLACE) 100 MG capsule Take 100 mg by mouth daily.     estradiol (ESTRACE) 0.1 MG/GM vaginal cream Place 1 g vaginally 2 (two) times a week.     fluorouracil (EFUDEX) 5 % cream Apply topically.     hydrocortisone  (CORTEF ) 10 MG tablet Take 5-10 mg by mouth See admin instructions. Take 1 tablet (10 mg) by mouth in the morning & 1/2 tablet (5 mg) by mouth in the evening.     ipratropium (ATROVENT  HFA) 17 MCG/ACT inhaler Inhale 2 puffs into the lungs every 6 (six) hours as needed for wheezing.     levothyroxine  (SYNTHROID ) 75 MCG tablet Take 75 mcg by mouth daily before breakfast.      lidocaine  (LIDODERM ) 5 % Place 1 patch onto the skin daily as needed. Remove & Discard patch within 12 hours or as directed by MD (Patient taking differently: Place 1 patch onto the skin daily. Remove & Discard patch within 12 hours or as directed by MD) 30 patch 1   lidocaine  (LMX) 4 % cream Apply 1 Application topically daily as needed (pain).     lidocaine -prilocaine  (EMLA ) cream Apply 1 Application topically every 3 (three) months.     mupirocin  ointment (BACTROBAN ) 2 % Apply topically 2 (two) times daily. (Patient taking differently: Apply 1 Application topically daily.) 30 g 1   pantoprazole  (PROTONIX ) 20 MG tablet Take 1 tablet (20 mg total) by mouth daily. 30 tablet 6   repaglinide  (PRANDIN ) 0.5 MG tablet Take 0.5 mg by mouth 2 (two) times daily.     SSD 1 % cream Apply 1 Application topically daily as needed (Foot infection).     traMADol  (ULTRAM ) 50 MG tablet Take 1 tablet (50 mg  total) by mouth every 6 (six) hours as needed. (Patient taking differently: Take 50 mg by mouth 4 (four) times daily.) 15 tablet 0   No current  facility-administered medications for this visit.    Allergies[1]   REVIEW OF SYSTEMS:  Negative unless noted in HPI [X]  denotes positive finding, [ ]  denotes negative finding Cardiac  Comments:  Chest pain or chest pressure:    Shortness of breath upon exertion:    Short of breath when lying flat:    Irregular heart rhythm:        Vascular    Pain in calf, thigh, or hip brought on by ambulation:    Pain in feet at night that wakes you up from your sleep:     Blood clot in your veins:    Leg swelling:         Pulmonary    Oxygen at home:    Productive cough:     Wheezing:         Neurologic    Sudden weakness in arms or legs:     Sudden numbness in arms or legs:     Sudden onset of difficulty speaking or slurred speech:    Temporary loss of vision in one eye:     Problems with dizziness:         Gastrointestinal    Blood in stool:     Vomited blood:         Genitourinary    Burning when urinating:     Blood in urine:        Psychiatric    Major depression:         Hematologic    Bleeding problems:    Problems with blood clotting too easily:        Skin    Rashes or ulcers:        Constitutional    Fever or chills:      PHYSICAL EXAMINATION:  Vitals:   04/01/24 0919  BP: (!) 151/71  Pulse: 66  Temp: 97.7 F (36.5 C)  TempSrc: Temporal  Weight: 121 lb 12.8 oz (55.2 kg)    General:  WDWN in NAD; vital signs documented above Gait: Not observed HENT: WNL, normocephalic Pulmonary: normal non-labored breathing Cardiac: regular HR Abdomen: soft, NT, no masses Skin: without rashes Vascular Exam/Pulses: Venous leg peroneal signal in the mid lower leg Extremities: Right foot is cold to touch; no open wounds; calcified but palpable left femoral pulse Musculoskeletal: no muscle wasting or atrophy  Neurologic: A&O X 3 Psychiatric:  The pt has Normal affect.   Non-Invasive Vascular Imaging:    Occluded SFA, popliteal, and TP trunk stents by  duplex  ABI/TBIToday's ABIToday's TBIPrevious ABIPrevious TBI  +-------+-----------+-----------+------------+------------+  Right 0          0          .96         .40           +-------+-----------+-----------+------------+------------+  Left  .58        .32        .59         .21           +-------+-----------+-----------+------------+------------+    ASSESSMENT/PLAN:: 83 y.o. female added to clinic schedule as an urgent triage patient with right foot coldness and pain  Audrey [name] is an 83 year old female who underwent right SFA stenting in July 2024.  She subsequently thrombosed her SFA stents and underwent  atherectomy and stenting of her right SFA, popliteal, and TP trunk in September of this year.  4 days ago she noticed a sudden pain and coldness in her right foot.  Pain is nonstop and wakes her up every 2 hours overnight.  On exam I can only find a venous like peroneal signal in her mid lower leg.  She states she has a feeling in her foot however her foot feels tight when she is wiggling her toes.  Duplex confirms occluded SFA, popliteal, TP trunk stents with minimal blood flow below this level.  She is aware she is at risk for limb loss and her leg is currently threatened.  She was offered admission to the hospital to initiate IV heparin  prior to performing right lower extremity angiogram.  She is agreeable to having right lower extremity angiogram with Dr. Magda tomorrow with likely plans for bypass surgery with Dr. Sheree next week.  She will be admitted after her angiogram for IV heparin  and likely vein mapping.  She may also benefit from cardiac clearance over the weekend.  The above plan was reviewed in detail with the patient and she and her son are agreeable to proceed.  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  Contrast is necessary but will be limited.  CO2 contrast may also be used during  her procedure.   Audrey Sender, PA-C [facility] [phone]  Clinic MD:   Sheree     [1]  Allergies Allergen Reactions   Doxycycline Nausea And Vomiting   Adhesive [Tape] Other (See Comments)    Tears skin off Paper tape is ok per patient   Cymbalta [Duloxetine Hcl]    Duloxetine Other (See Comments)    Feel woozy   Gabapentin  (Once-Daily) Other (See Comments)    Feels loopy   Nsaids Other (See Comments)    Pt only has one kidney    Statins Other (See Comments)    Liver/kidney issues.

## 2024-04-02 ENCOUNTER — Other Ambulatory Visit: Payer: Self-pay

## 2024-04-02 ENCOUNTER — Inpatient Hospital Stay (HOSPITAL_COMMUNITY)
Admission: RE | Admit: 2024-04-02 | Discharge: 2024-04-06 | DRG: 279 | Disposition: A | Discharge status: Rehabilitation Facility | Attending: Vascular Surgery | Admitting: Vascular Surgery

## 2024-04-02 ENCOUNTER — Encounter (HOSPITAL_COMMUNITY)
Admission: RE | Disposition: A | Discharge status: Rehabilitation Facility | Payer: Self-pay | Source: Home / Self Care | Attending: Vascular Surgery

## 2024-04-02 ENCOUNTER — Encounter (HOSPITAL_COMMUNITY): Payer: Self-pay | Admitting: Vascular Surgery

## 2024-04-02 DIAGNOSIS — M79A21 Nontraumatic compartment syndrome of right lower extremity: Secondary | ICD-10-CM | POA: Diagnosis not present

## 2024-04-02 DIAGNOSIS — I70221 Atherosclerosis of native arteries of extremities with rest pain, right leg: Secondary | ICD-10-CM

## 2024-04-02 DIAGNOSIS — I97638 Postprocedural hematoma of a circulatory system organ or structure following other circulatory system procedure: Secondary | ICD-10-CM | POA: Diagnosis not present

## 2024-04-02 DIAGNOSIS — Z79890 Hormone replacement therapy (postmenopausal): Secondary | ICD-10-CM

## 2024-04-02 DIAGNOSIS — E039 Hypothyroidism, unspecified: Secondary | ICD-10-CM | POA: Diagnosis present

## 2024-04-02 DIAGNOSIS — I2510 Atherosclerotic heart disease of native coronary artery without angina pectoris: Secondary | ICD-10-CM | POA: Diagnosis present

## 2024-04-02 DIAGNOSIS — Y92230 Patient room in hospital as the place of occurrence of the external cause: Secondary | ICD-10-CM | POA: Diagnosis not present

## 2024-04-02 DIAGNOSIS — I10 Essential (primary) hypertension: Secondary | ICD-10-CM | POA: Diagnosis present

## 2024-04-02 DIAGNOSIS — I743 Embolism and thrombosis of arteries of the lower extremities: Principal | ICD-10-CM | POA: Diagnosis present

## 2024-04-02 DIAGNOSIS — I998 Other disorder of circulatory system: Secondary | ICD-10-CM | POA: Diagnosis not present

## 2024-04-02 DIAGNOSIS — I959 Hypotension, unspecified: Secondary | ICD-10-CM | POA: Diagnosis not present

## 2024-04-02 DIAGNOSIS — K219 Gastro-esophageal reflux disease without esophagitis: Secondary | ICD-10-CM | POA: Diagnosis present

## 2024-04-02 DIAGNOSIS — K5901 Slow transit constipation: Secondary | ICD-10-CM | POA: Diagnosis not present

## 2024-04-02 DIAGNOSIS — N179 Acute kidney failure, unspecified: Secondary | ICD-10-CM | POA: Diagnosis not present

## 2024-04-02 DIAGNOSIS — Z87891 Personal history of nicotine dependence: Secondary | ICD-10-CM | POA: Diagnosis not present

## 2024-04-02 DIAGNOSIS — R310 Gross hematuria: Secondary | ICD-10-CM | POA: Diagnosis not present

## 2024-04-02 DIAGNOSIS — E1140 Type 2 diabetes mellitus with diabetic neuropathy, unspecified: Secondary | ICD-10-CM | POA: Diagnosis present

## 2024-04-02 DIAGNOSIS — I82451 Acute embolism and thrombosis of right peroneal vein: Secondary | ICD-10-CM | POA: Diagnosis present

## 2024-04-02 DIAGNOSIS — Z8249 Family history of ischemic heart disease and other diseases of the circulatory system: Secondary | ICD-10-CM

## 2024-04-02 DIAGNOSIS — I70222 Atherosclerosis of native arteries of extremities with rest pain, left leg: Secondary | ICD-10-CM | POA: Diagnosis not present

## 2024-04-02 DIAGNOSIS — Z794 Long term (current) use of insulin: Secondary | ICD-10-CM | POA: Diagnosis not present

## 2024-04-02 DIAGNOSIS — Z7902 Long term (current) use of antithrombotics/antiplatelets: Secondary | ICD-10-CM

## 2024-04-02 DIAGNOSIS — B353 Tinea pedis: Secondary | ICD-10-CM | POA: Diagnosis present

## 2024-04-02 DIAGNOSIS — M6282 Rhabdomyolysis: Secondary | ICD-10-CM | POA: Diagnosis not present

## 2024-04-02 DIAGNOSIS — L89616 Pressure-induced deep tissue damage of right heel: Secondary | ICD-10-CM | POA: Diagnosis present

## 2024-04-02 DIAGNOSIS — K5900 Constipation, unspecified: Secondary | ICD-10-CM | POA: Diagnosis not present

## 2024-04-02 DIAGNOSIS — R739 Hyperglycemia, unspecified: Secondary | ICD-10-CM | POA: Diagnosis not present

## 2024-04-02 DIAGNOSIS — L97919 Non-pressure chronic ulcer of unspecified part of right lower leg with unspecified severity: Secondary | ICD-10-CM | POA: Diagnosis present

## 2024-04-02 DIAGNOSIS — Y838 Other surgical procedures as the cause of abnormal reaction of the patient, or of later complication, without mention of misadventure at the time of the procedure: Secondary | ICD-10-CM | POA: Diagnosis present

## 2024-04-02 DIAGNOSIS — M79661 Pain in right lower leg: Secondary | ICD-10-CM | POA: Diagnosis not present

## 2024-04-02 DIAGNOSIS — Z79899 Other long term (current) drug therapy: Secondary | ICD-10-CM

## 2024-04-02 DIAGNOSIS — L89626 Pressure-induced deep tissue damage of left heel: Secondary | ICD-10-CM | POA: Diagnosis present

## 2024-04-02 DIAGNOSIS — B379 Candidiasis, unspecified: Secondary | ICD-10-CM | POA: Diagnosis not present

## 2024-04-02 DIAGNOSIS — T82868A Thrombosis of vascular prosthetic devices, implants and grafts, initial encounter: Secondary | ICD-10-CM | POA: Diagnosis present

## 2024-04-02 DIAGNOSIS — Z85528 Personal history of other malignant neoplasm of kidney: Secondary | ICD-10-CM

## 2024-04-02 DIAGNOSIS — E1122 Type 2 diabetes mellitus with diabetic chronic kidney disease: Secondary | ICD-10-CM | POA: Diagnosis present

## 2024-04-02 DIAGNOSIS — E11621 Type 2 diabetes mellitus with foot ulcer: Secondary | ICD-10-CM | POA: Diagnosis present

## 2024-04-02 DIAGNOSIS — D649 Anemia, unspecified: Secondary | ICD-10-CM | POA: Diagnosis not present

## 2024-04-02 DIAGNOSIS — Z9221 Personal history of antineoplastic chemotherapy: Secondary | ICD-10-CM

## 2024-04-02 DIAGNOSIS — Z905 Acquired absence of kidney: Secondary | ICD-10-CM

## 2024-04-02 DIAGNOSIS — R5381 Other malaise: Secondary | ICD-10-CM | POA: Diagnosis not present

## 2024-04-02 DIAGNOSIS — N3001 Acute cystitis with hematuria: Secondary | ICD-10-CM | POA: Diagnosis not present

## 2024-04-02 DIAGNOSIS — Z7982 Long term (current) use of aspirin: Secondary | ICD-10-CM

## 2024-04-02 DIAGNOSIS — E785 Hyperlipidemia, unspecified: Secondary | ICD-10-CM | POA: Diagnosis present

## 2024-04-02 DIAGNOSIS — R9431 Abnormal electrocardiogram [ECG] [EKG]: Secondary | ICD-10-CM | POA: Diagnosis not present

## 2024-04-02 DIAGNOSIS — Z604 Social exclusion and rejection: Secondary | ICD-10-CM | POA: Diagnosis present

## 2024-04-02 DIAGNOSIS — E1165 Type 2 diabetes mellitus with hyperglycemia: Secondary | ICD-10-CM | POA: Diagnosis present

## 2024-04-02 DIAGNOSIS — N3000 Acute cystitis without hematuria: Secondary | ICD-10-CM | POA: Diagnosis not present

## 2024-04-02 DIAGNOSIS — T79A21A Traumatic compartment syndrome of right lower extremity, initial encounter: Secondary | ICD-10-CM | POA: Diagnosis not present

## 2024-04-02 DIAGNOSIS — Z95820 Peripheral vascular angioplasty status with implants and grafts: Secondary | ICD-10-CM | POA: Diagnosis not present

## 2024-04-02 DIAGNOSIS — E1142 Type 2 diabetes mellitus with diabetic polyneuropathy: Secondary | ICD-10-CM | POA: Diagnosis present

## 2024-04-02 DIAGNOSIS — E1151 Type 2 diabetes mellitus with diabetic peripheral angiopathy without gangrene: Secondary | ICD-10-CM | POA: Diagnosis present

## 2024-04-02 DIAGNOSIS — N1832 Chronic kidney disease, stage 3b: Secondary | ICD-10-CM | POA: Diagnosis present

## 2024-04-02 DIAGNOSIS — T82858A Stenosis of vascular prosthetic devices, implants and grafts, initial encounter: Secondary | ICD-10-CM | POA: Diagnosis not present

## 2024-04-02 DIAGNOSIS — F54 Psychological and behavioral factors associated with disorders or diseases classified elsewhere: Secondary | ICD-10-CM | POA: Diagnosis not present

## 2024-04-02 DIAGNOSIS — W19XXXA Unspecified fall, initial encounter: Secondary | ICD-10-CM | POA: Diagnosis not present

## 2024-04-02 DIAGNOSIS — Z9049 Acquired absence of other specified parts of digestive tract: Secondary | ICD-10-CM

## 2024-04-02 DIAGNOSIS — Z66 Do not resuscitate: Secondary | ICD-10-CM | POA: Diagnosis present

## 2024-04-02 DIAGNOSIS — E2740 Unspecified adrenocortical insufficiency: Secondary | ICD-10-CM | POA: Diagnosis present

## 2024-04-02 DIAGNOSIS — L89156 Pressure-induced deep tissue damage of sacral region: Secondary | ICD-10-CM | POA: Diagnosis present

## 2024-04-02 DIAGNOSIS — L03115 Cellulitis of right lower limb: Secondary | ICD-10-CM | POA: Diagnosis present

## 2024-04-02 DIAGNOSIS — I739 Peripheral vascular disease, unspecified: Secondary | ICD-10-CM | POA: Diagnosis not present

## 2024-04-02 DIAGNOSIS — N390 Urinary tract infection, site not specified: Secondary | ICD-10-CM | POA: Diagnosis present

## 2024-04-02 DIAGNOSIS — N814 Uterovaginal prolapse, unspecified: Secondary | ICD-10-CM | POA: Diagnosis present

## 2024-04-02 DIAGNOSIS — I70202 Unspecified atherosclerosis of native arteries of extremities, left leg: Secondary | ICD-10-CM | POA: Diagnosis not present

## 2024-04-02 DIAGNOSIS — I82431 Acute embolism and thrombosis of right popliteal vein: Secondary | ICD-10-CM | POA: Diagnosis present

## 2024-04-02 DIAGNOSIS — I70229 Atherosclerosis of native arteries of extremities with rest pain, unspecified extremity: Secondary | ICD-10-CM | POA: Diagnosis present

## 2024-04-02 DIAGNOSIS — D62 Acute posthemorrhagic anemia: Secondary | ICD-10-CM | POA: Diagnosis not present

## 2024-04-02 DIAGNOSIS — I129 Hypertensive chronic kidney disease with stage 1 through stage 4 chronic kidney disease, or unspecified chronic kidney disease: Secondary | ICD-10-CM | POA: Diagnosis present

## 2024-04-02 DIAGNOSIS — I252 Old myocardial infarction: Secondary | ICD-10-CM

## 2024-04-02 DIAGNOSIS — Z9889 Other specified postprocedural states: Secondary | ICD-10-CM | POA: Diagnosis not present

## 2024-04-02 DIAGNOSIS — L7632 Postprocedural hematoma of skin and subcutaneous tissue following other procedure: Secondary | ICD-10-CM | POA: Diagnosis not present

## 2024-04-02 DIAGNOSIS — R21 Rash and other nonspecific skin eruption: Secondary | ICD-10-CM | POA: Diagnosis present

## 2024-04-02 DIAGNOSIS — D72829 Elevated white blood cell count, unspecified: Secondary | ICD-10-CM | POA: Diagnosis not present

## 2024-04-02 HISTORY — DX: Nausea with vomiting, unspecified: R11.2

## 2024-04-02 LAB — URINALYSIS, COMPLETE (UACMP) WITH MICROSCOPIC
Bilirubin Urine: NEGATIVE
Glucose, UA: 150 mg/dL — AB
Ketones, ur: NEGATIVE mg/dL
Nitrite: NEGATIVE
Protein, ur: 30 mg/dL — AB
RBC / HPF: >50 RBC/hpf (ref 0–5)
Specific Gravity, Urine: 1.018 (ref 1.005–1.030)
WBC, UA: >50 WBC/hpf (ref 0–5)
pH: 6 (ref 5.0–8.0)

## 2024-04-02 LAB — SURGICAL PCR SCREEN
MRSA, PCR: NEGATIVE
Staphylococcus aureus: NEGATIVE

## 2024-04-02 LAB — GLUCOSE, CAPILLARY
Glucose-Capillary: 178 mg/dL — ABNORMAL HIGH (ref 70–99)
Glucose-Capillary: 340 mg/dL — ABNORMAL HIGH (ref 70–99)

## 2024-04-02 LAB — POCT I-STAT, CHEM 8
BUN: 67 mg/dL — ABNORMAL HIGH (ref 8–23)
BUN: 70 mg/dL — ABNORMAL HIGH (ref 8–23)
Calcium, Ion: 1.27 mmol/L (ref 1.15–1.40)
Calcium, Ion: 1.28 mmol/L (ref 1.15–1.40)
Chloride: 108 mmol/L (ref 98–111)
Chloride: 110 mmol/L (ref 98–111)
Creatinine, Ser: 1.7 mg/dL — ABNORMAL HIGH (ref 0.44–1.00)
Creatinine, Ser: 1.8 mg/dL — ABNORMAL HIGH (ref 0.44–1.00)
Glucose, Bld: 257 mg/dL — ABNORMAL HIGH (ref 70–99)
Glucose, Bld: 261 mg/dL — ABNORMAL HIGH (ref 70–99)
HCT: 41 % (ref 36.0–46.0)
HCT: 43 % (ref 36.0–46.0)
Hemoglobin: 13.9 g/dL (ref 12.0–15.0)
Hemoglobin: 14.6 g/dL (ref 12.0–15.0)
Potassium: 6.9 mmol/L (ref 3.5–5.1)
Potassium: 6.9 mmol/L (ref 3.5–5.1)
Sodium: 137 mmol/L (ref 135–145)
Sodium: 138 mmol/L (ref 135–145)
TCO2: 23 mmol/L (ref 22–32)
TCO2: 25 mmol/L (ref 22–32)

## 2024-04-02 LAB — CBC
HCT: 40.4 % (ref 36.0–46.0)
Hemoglobin: 13.4 g/dL (ref 12.0–15.0)
MCH: 30.2 pg (ref 26.0–34.0)
MCHC: 33.2 g/dL (ref 30.0–36.0)
MCV: 91 fL (ref 80.0–100.0)
Platelets: 270 K/uL (ref 150–400)
RBC: 4.44 MIL/uL (ref 3.87–5.11)
RDW: 14.3 % (ref 11.5–15.5)
WBC: 14.6 K/uL — ABNORMAL HIGH (ref 4.0–10.5)
nRBC: 0 % (ref 0.0–0.2)

## 2024-04-02 LAB — FIBRINOGEN
Fibrinogen: 481 mg/dL — ABNORMAL HIGH (ref 210–475)
Fibrinogen: 403 mg/dL (ref 210–475)

## 2024-04-02 LAB — HEMOGLOBIN A1C
Hgb A1c MFr Bld: 10.3 % — ABNORMAL HIGH (ref 4.8–5.6)
Mean Plasma Glucose: 248.91 mg/dL

## 2024-04-02 LAB — BASIC METABOLIC PANEL WITH GFR
Anion gap: 9 (ref 5–15)
BUN: 43 mg/dL — ABNORMAL HIGH (ref 8–23)
CO2: 20 mmol/L — ABNORMAL LOW (ref 22–32)
Calcium: 9.8 mg/dL (ref 8.9–10.3)
Chloride: 107 mmol/L (ref 98–111)
Creatinine, Ser: 1.69 mg/dL — ABNORMAL HIGH (ref 0.44–1.00)
GFR, Estimated: 30 mL/min — ABNORMAL LOW (ref >60)
Glucose, Bld: 278 mg/dL — ABNORMAL HIGH (ref 70–99)
Potassium: 4.3 mmol/L (ref 3.5–5.1)
Sodium: 136 mmol/L (ref 135–145)

## 2024-04-02 LAB — HEPARIN LEVEL (UNFRACTIONATED)
Heparin Unfractionated: 0.58 [IU]/mL (ref 0.30–0.70)
Heparin Unfractionated: 0.25 [IU]/mL — ABNORMAL LOW (ref 0.30–0.70)

## 2024-04-02 SURGERY — ABDOMINAL AORTOGRAM W/LOWER EXTREMITY
Anesthesia: LOCAL

## 2024-04-02 MED ORDER — SODIUM CHLORIDE 0.9 % IV SOLN
250.0000 mL | INTRAVENOUS | Status: AC | PRN
  Administered 2024-04-02: 250 mL via INTRAVENOUS

## 2024-04-02 MED ORDER — MIDAZOLAM HCL (PF) 2 MG/2ML IJ SOLN
INTRAMUSCULAR | Status: DC | PRN
  Administered 2024-04-02: 1 mg via INTRAVENOUS

## 2024-04-02 MED ORDER — HEPARIN SODIUM (PORCINE) 1000 UNIT/ML IJ SOLN
INTRAMUSCULAR | Status: DC | PRN
  Administered 2024-04-02: 6000 [IU] via INTRAVENOUS

## 2024-04-02 MED ORDER — SODIUM CHLORIDE 0.9 % IV SOLN
1.0000 mg/h | INTRAVENOUS | Status: DC
  Administered 2024-04-02: 1 mg/h
  Filled 2024-04-02 (×2): qty 24

## 2024-04-02 MED ORDER — HEPARIN (PORCINE) 25000 UT/250ML-% IV SOLN
800.0000 [IU]/h | INTRAVENOUS | Status: DC
  Administered 2024-04-02: 800 [IU]/h via INTRAVENOUS
  Filled 2024-04-02: qty 250

## 2024-04-02 MED ORDER — INSULIN ASPART 100 UNIT/ML IJ SOLN
0.0000 [IU] | Freq: Every day | INTRAMUSCULAR | Status: DC
  Administered 2024-04-02: 4 [IU] via SUBCUTANEOUS
  Filled 2024-04-02: qty 4

## 2024-04-02 MED ORDER — MIDAZOLAM HCL (PF) 2 MG/2ML IJ SOLN
1.0000 mg | INTRAMUSCULAR | Status: DC | PRN
  Administered 2024-04-02: 1 mg via INTRAVENOUS
  Filled 2024-04-02: qty 2

## 2024-04-02 MED ORDER — ONDANSETRON HCL 4 MG/2ML IJ SOLN: 4.0000 mg | Freq: Four times a day (QID) | INTRAMUSCULAR | Status: DC | PRN

## 2024-04-02 MED ORDER — CLOBETASOL PROPIONATE 0.05 % EX CREA
TOPICAL_CREAM | Freq: Two times a day (BID) | CUTANEOUS | Status: DC
  Filled 2024-04-02: qty 15

## 2024-04-02 MED ORDER — FENTANYL CITRATE (PF) 100 MCG/2ML IJ SOLN
INTRAMUSCULAR | Status: DC | PRN
  Administered 2024-04-02: 50 ug via INTRAVENOUS

## 2024-04-02 MED ORDER — MIDAZOLAM HCL 2 MG/2ML IJ SOLN
INTRAMUSCULAR | Status: AC
  Filled 2024-04-02: qty 2

## 2024-04-02 MED ORDER — MORPHINE SULFATE (PF) 2 MG/ML IV SOLN
5.0000 mg | INTRAVENOUS | Status: DC | PRN
  Administered 2024-04-02 – 2024-04-03 (×5): 5 mg via INTRAVENOUS
  Filled 2024-04-02 (×5): qty 3

## 2024-04-02 MED ORDER — LIDOCAINE HCL (PF) 1 % IJ SOLN
INTRAMUSCULAR | Status: DC | PRN
  Administered 2024-04-02: 15 mL

## 2024-04-02 MED ORDER — SODIUM CHLORIDE 0.9 % IV SOLN: INTRAVENOUS | Status: DC

## 2024-04-02 MED ORDER — HEPARIN (PORCINE) IN NACL 1000-0.9 UT/500ML-% IV SOLN
INTRAVENOUS | Status: DC | PRN
  Administered 2024-04-02: 1000 mL

## 2024-04-02 MED ORDER — SODIUM CHLORIDE 0.9% FLUSH
3.0000 mL | Freq: Two times a day (BID) | INTRAVENOUS | Status: DC
  Administered 2024-04-02 – 2024-04-05 (×5): 3 mL via INTRAVENOUS

## 2024-04-02 MED ORDER — LIDOCAINE HCL (PF) 1 % IJ SOLN
INTRAMUSCULAR | Status: AC
  Filled 2024-04-02: qty 30

## 2024-04-02 MED ORDER — CHLORHEXIDINE GLUCONATE CLOTH 2 % EX PADS
6.0000 | MEDICATED_PAD | Freq: Every day | CUTANEOUS | Status: DC
  Administered 2024-04-02 – 2024-04-06 (×4): 6 via TOPICAL

## 2024-04-02 MED ORDER — INSULIN ASPART 100 UNIT/ML IJ SOLN
0.0000 [IU] | Freq: Three times a day (TID) | INTRAMUSCULAR | Status: DC
  Administered 2024-04-03: 11 [IU] via SUBCUTANEOUS
  Filled 2024-04-02: qty 11

## 2024-04-02 MED ORDER — SODIUM CHLORIDE 0.9% FLUSH: 3.0000 mL | INTRAVENOUS | Status: DC | PRN

## 2024-04-02 MED ORDER — SODIUM CHLORIDE 0.9 % IV SOLN: INTRAVENOUS | Status: AC

## 2024-04-02 MED ORDER — HEPARIN SODIUM (PORCINE) 1000 UNIT/ML IJ SOLN
INTRAMUSCULAR | Status: AC
  Filled 2024-04-02: qty 10

## 2024-04-02 MED ORDER — FENTANYL CITRATE (PF) 100 MCG/2ML IJ SOLN
INTRAMUSCULAR | Status: AC
  Filled 2024-04-02: qty 2

## 2024-04-02 MED ORDER — TRAMADOL HCL 50 MG PO TABS
50.0000 mg | ORAL_TABLET | ORAL | Status: DC | PRN
  Administered 2024-04-02 – 2024-04-04 (×3): 50 mg via ORAL
  Filled 2024-04-02 (×3): qty 1

## 2024-04-02 MED ORDER — INSULIN ASPART 100 UNIT/ML IJ SOLN
0.0000 [IU] | Freq: Three times a day (TID) | INTRAMUSCULAR | Status: DC
  Administered 2024-04-02: 3 [IU] via SUBCUTANEOUS
  Filled 2024-04-02: qty 3

## 2024-04-02 MED ADMIN — Iodixanol Inj 320 MG/ML (Iodine Equivalent): 50 mL | NDC 00407222316

## 2024-04-02 SURGICAL SUPPLY — 18 items
BALLOON STERLING OTW 3X150X150 (BALLOONS) IMPLANT
CATH EKOS ULTRASOUND 135X50 (CATHETERS) IMPLANT
CATH OMNI FLUSH 5F 65CM (CATHETERS) IMPLANT
CATH QUICKCROSS SUPP .018X90CM (MICROCATHETER) IMPLANT
CATH SYNTRAX .035X135 (CATHETERS) IMPLANT
GLIDEWIRE ADV .035X260CM (WIRE) IMPLANT
GUIDEWIRE ANGLED .035 180CM (WIRE) IMPLANT
KIT ENCORE 26 ADVANTAGE (KITS) IMPLANT
KIT MICROPUNCTURE NIT STIFF (SHEATH) IMPLANT
KIT SINGLE USE MANIFOLD (KITS) IMPLANT
PACK CARDIAC CATHETERIZATION (CUSTOM PROCEDURE TRAY) IMPLANT
SET ATX-X65L (MISCELLANEOUS) IMPLANT
SHEATH HIGHFLEX ANSEL 6FRX55 (SHEATH) IMPLANT
SHEATH PINNACLE 5F 10CM (SHEATH) IMPLANT
SHEATH PROBE COVER 6X72 (BAG) IMPLANT
WIRE BENTSON .035X145CM (WIRE) IMPLANT
WIRE G V18X300CM (WIRE) IMPLANT
WIRE SHEPHERD 6G .018 (WIRE) IMPLANT

## 2024-04-02 NOTE — Progress Notes (Signed)
 ANTICOAGULATION CONSULT NOTE  Pharmacy Consult for heparin  Indication: CLI  Allergies[1]  Patient Measurements: Height: 4' 10 (147.3 cm) Weight: 54.4 kg (120 lb) IBW/kg (Calculated) : 40.9 Heparin  Dosing Weight: 52 kg   Vital Signs: Temp: 98.4 F (36.9 C) (12/12 0852) Temp Source: Oral (12/12 0852) BP: 121/79 (12/12 1310) Pulse Rate: 69 (12/12 1310)  Labs: Recent Labs    04/02/24 0852 04/02/24 0857 04/02/24 0917  HGB 14.6 13.9  --   HCT 43.0 41.0  --   CREATININE 1.80* 1.70* 1.69*    Estimated Creatinine Clearance: 18.8 mL/min (A) (by C-G formula based on SCr of 1.69 mg/dL (H)).  Medical History: Past Medical History:  Diagnosis Date   Anemia    Arthritis    lower back, hips, hands   Biliary stricture (HCC)    Diabetes mellitus (HCC)    type 2    Early cataracts, bilateral    Md just watching   Elevated liver enzymes    Family history of adverse reaction to anesthesia    Daughter hard to wake up   Gallstones    GERD (gastroesophageal reflux disease)    occasional - diet controlled   History of blood transfusion 2018   History of hiatal hernia    HTN (hypertension)    Hyperlipidemia    Hypothyroidism    left renal ca dx'd 2018   renal cancer - left kidney removed, pill chemo x 1 yr   Myocardial infarction (HCC) 1991   no deficits   Peripheral vascular disease    SVD (spontaneous vaginal delivery)    x 3   Wears glasses     Medications:  See MAR  Assessment: 60 yoF presented to [facility] 12/11 with pain/coldness of R foot for past 4 days.  PMH significant for right SFA stenting 10/2022 with recent thrombosis of SFA stent s/p laser atherectomy, balloon angioplasty and stenting of R SFA, popliteal, and TP trunk 12/29/2023.  Now s/p RLE angioplasty and initiation of cath-directed lytics.  Returned to ICU with heparin  IV 800 units/hr and alteplase  1 mg/h running.  Heparin  consult placed to continue current rate, titrations per MD.  Goal of Therapy:  Heparin   level 0.3-0.7 units/ml Monitor platelets by anticoagulation protocol: Yes   Plan:  Heparin  infusion 800 units/hr Q6h CBC, heparin  level, fibrinogen No titrations unless specified by MD Monitor for s/sx of bleeding  Maurilio Fila, PharmD Clinical Pharmacist 04/02/2024  2:02 PM     [1]  Allergies Allergen Reactions   Doxycycline Nausea And Vomiting   Adhesive [Tape] Other (See Comments)    Tears skin off Paper tape is ok per patient   Cymbalta [Duloxetine Hcl]    Duloxetine Other (See Comments)    Feel woozy   Gabapentin  (Once-Daily) Other (See Comments)    Feels loopy   Nsaids Other (See Comments)    Pt only has one kidney    Statins Other (See Comments)    Liver/kidney issues.

## 2024-04-02 NOTE — Op Note (Signed)
 DATE OF SERVICE: 04/02/2024  PATIENT:  Audrey [name]  83 y.o. female  PRE-OPERATIVE DIAGNOSIS:  thrombosis of right lower extremity arterial stenting  POST-OPERATIVE DIAGNOSIS:  Same  PROCEDURE:   1) Ultrasound guided left common femoral artery access  2) Aortogram  3) Right lower extremity angiogram with second order cannulation  4) Additional right lower extremity angiogram with third order cannulation 5) Right peroneal, TP trunk, popliteal artery angioplasty (3x121mm Sterling) 6) Initiation of catheter directed thrombolysis (EKOS) 7) Conscious sedation (43 minutes)   SURGEON:  Debby SAILOR. Magda, MD  ASSISTANT: none  ANESTHESIA:   local and IV sedation  ESTIMATED BLOOD LOSS: min  LOCAL MEDICATIONS USED:  LIDOCAINE    COUNTS: confirmed correct.  PATIENT DISPOSITION:  PACU - hemodynamically stable.   Delay start of Pharmacological VTE agent (>24hrs) due to surgical blood loss or risk of bleeding: no  INDICATION FOR PROCEDURE: Audrey [name] is a 84 y.o. female with right leg pain in setting of thrombosed sfa / pop / TP trunk stenting. After careful discussion of risks, benefits, and alternatives the patient was offered angiogram. The patient understood and wished to proceed.  OPERATIVE FINDINGS:   Aortogram: Renal arteries Left: patent  Right: patent Infrarenal aorta: patent Common iliac arteries: Left: patent  Right: patent Internal iliac arteries: Left: patent  Right: patent External iliac arteries: Left: patent  Right: patent  Right Lower Extremity Angiogram:  Common femoral artery: patent  Profunda femoris artery: patent  Superficial femoral artery: patent proximally; occludes as stenting begins around Hunter's canal  Popliteal artery: occluded stents above, behind, and below the knee  Anterior tibial artery: occluded  Tibioperoneal trunk: occluded stent  Peroneal artery: reconstitutes, but courses only to the high ankle  Posterior tibial artery:  occluded  Pedal circulation: no contrast visualized  GLASS score. N/A  WIfI score. N/A  DESCRIPTION OF PROCEDURE: After identification of the patient in the pre-operative holding area, the patient was transferred to the operating room. The patient was positioned supine on the operating room table. Anesthesia was induced. The groins was prepped and draped in standard fashion. A surgical pause was performed confirming correct patient, procedure, and operative location.  The left groin was anesthetized with subcutaneous injection of 1% lidocaine . Using ultrasound guidance, the left common femoral artery was accessed with micropuncture technique. Fluoroscopy was used to confirm cannulation over the femoral head. The 48F micropuncture sheath was upsized to 53F.   A Benson wire was advanced into the distal aorta. Over the wire an omni flush catheter was advanced to the level of L2. Aortogram was performed - see above for details.   The right common iliac artery was selected with an omniflush catheter and glidewire guidewire. The wire was advanced into the common femoral artery. Over the wire the omni flush catheter was advanced into the external iliac artery. Selective angiography was performed - see above for details.   The decision was made to intervene. The patient was heparinized with 6000 units of heparin . The 53F sheath was exchanged for a 38F x 55cm sheath. Selective angiography of the right lower extremity performed prior to intervention.   I was able to cross the occluded stents fairly easily with a V18 wire.  The V18 wire would not advance much beyond the TP trunk stent, and so at both the sheath platform into the SFA.  With additional support I was able to cross into the native peroneal artery with a 6 g shepherd wire.  I then performed balloon  angioplasty of the proximal peroneal artery, TP trunk, and popliteal artery with a 3 x 150 mm Sterling balloon.  Third order angiogram was then performed to  confirm flow below this intervention.  The peroneal artery appeared occluded in the high ankle.  The anterior tibial artery did seem to reconstitute.  Given the ease of wire passage and the fairly acute appearing occlusion, elected to initiate catheter directed thrombolysis.  An EKOS catheter was advanced over the wire into the native peroneal artery extending back into the superficial femoral artery.  Catheter directed thrombolysis was initiated.  Conscious sedation was administered with the use of IV fentanyl  and midazolam  under continuous physician and nurse monitoring.  Heart rate, blood pressure, and oxygen saturation were continuously monitored.  Total sedation time was 43 minutes  Upon completion of the case instrument and sharps counts were confirmed correct. The patient was transferred to the PACU in good condition. I was present for all portions of the procedure.  PLAN: Thrombolysis overnight.  Return to OR tomorrow for catheter recheck.  Debby SAILOR. Magda, MD [facility] Phone Number: [phone] 04/02/2024 12:14 PM

## 2024-04-02 NOTE — Interval H&P Note (Signed)
 History and Physical Interval Note:  04/02/2024 12:13 PM  Audrey [name]  has presented today for surgery, with the diagnosis of critical limb ischemia rt.  The various methods of treatment have been discussed with the patient and family. After consideration of risks, benefits and other options for treatment, the patient has consented to  Procedures: ABDOMINAL AORTOGRAM W/LOWER EXTREMITY (N/A) LOWER EXTREMITY INTERVENTION (N/A) as a surgical intervention.  The patient's history has been reviewed, patient examined, no change in status, stable for surgery.  I have reviewed the patient's chart and labs.  Questions were answered to the patient's satisfaction.     Debby LOISE Robertson

## 2024-04-02 NOTE — Plan of Care (Signed)
   Problem: Education: Goal: Knowledge of General Education information will improve Description Including pain rating scale, medication(s)/side effects and non-pharmacologic comfort measures Outcome: Progressing   Problem: Health Behavior/Discharge Planning: Goal: Ability to manage health-related needs will improve Outcome: Progressing

## 2024-04-02 NOTE — Progress Notes (Signed)
 ANTICOAGULATION CONSULT NOTE  Pharmacy Consult for heparin  Indication: CLI  Allergies[1]  Patient Measurements: Height: 4' 10 (147.3 cm) Weight: 54.4 kg (120 lb) IBW/kg (Calculated) : 40.9 Heparin  Dosing Weight: 52 kg   Vital Signs: Temp: 98.4 F (36.9 C) (12/12 0852) Temp Source: Oral (12/12 0852) BP: 135/79 (12/12 1830) Pulse Rate: 89 (12/12 1830)  Labs: Recent Labs    04/02/24 0852 04/02/24 0857 04/02/24 0917 04/02/24 1417 04/02/24 1822 04/02/24 1840  HGB 14.6 13.9  --   --  13.4  --   HCT 43.0 41.0  --   --  40.4  --   PLT  --   --   --   --  270  --   HEPARINUNFRC  --   --   --  0.58  --  0.25*  CREATININE 1.80* 1.70* 1.69*  --   --   --     Estimated Creatinine Clearance: 18.8 mL/min (A) (by C-G formula based on SCr of 1.69 mg/dL (H)).  Medical History: Past Medical History:  Diagnosis Date   Anemia    Arthritis    lower back, hips, hands   Biliary stricture (HCC)    Diabetes mellitus (HCC)    type 2    Early cataracts, bilateral    Md just watching   Elevated liver enzymes    Family history of adverse reaction to anesthesia    Daughter hard to wake up   Gallstones    GERD (gastroesophageal reflux disease)    occasional - diet controlled   History of blood transfusion 2018   History of hiatal hernia    HTN (hypertension)    Hyperlipidemia    Hypothyroidism    left renal ca dx'd 2018   renal cancer - left kidney removed, pill chemo x 1 yr   Myocardial infarction (HCC) 1991   no deficits   Peripheral vascular disease    SVD (spontaneous vaginal delivery)    x 3   Wears glasses     Medications:  See MAR  Assessment: 42 yoF presented to [facility] 12/11 with pain/coldness of R foot for past 4 days.  PMH significant for right SFA stenting 10/2022 with recent thrombosis of SFA stent s/p laser atherectomy, balloon angioplasty and stenting of R SFA, popliteal, and TP trunk 12/29/2023.  Now s/p RLE angioplasty and initiation of cath-directed lytics.   Returned to ICU with heparin  IV 800 units/hr and alteplase  1 mg/h running.  Heparin  consult placed to continue current rate, titrations per MD.  2nd shift update: Heparin  level 0.25, Fibrinogen 403, Hb 13.4.   Goal of Therapy:  Heparin  level 0.3-0.7 units/ml Monitor platelets by anticoagulation protocol: Yes   Plan:  Heparin  infusion 800 units/hr Q6h CBC, heparin  level, fibrinogen No titrations unless specified by MD Monitor for s/sx of bleeding  Rankin Sams, PharmD, BCPS, BCCCP Clinical Pharmacist      [1]  Allergies Allergen Reactions   Doxycycline Nausea And Vomiting   Adhesive [Tape] Other (See Comments)    Tears skin off Paper tape is ok per patient   Cymbalta [Duloxetine Hcl]    Duloxetine Other (See Comments)    Feel woozy   Gabapentin  (Once-Daily) Other (See Comments)    Feels loopy   Nsaids Other (See Comments)    Pt only has one kidney    Statins Other (See Comments)    Liver/kidney issues.

## 2024-04-03 ENCOUNTER — Inpatient Hospital Stay (HOSPITAL_COMMUNITY): Admitting: Certified Registered Nurse Anesthetist

## 2024-04-03 ENCOUNTER — Encounter (HOSPITAL_COMMUNITY)
Admission: RE | Disposition: A | Discharge status: Rehabilitation Facility | Payer: Self-pay | Source: Home / Self Care | Attending: Vascular Surgery

## 2024-04-03 ENCOUNTER — Inpatient Hospital Stay (HOSPITAL_COMMUNITY)

## 2024-04-03 ENCOUNTER — Encounter (HOSPITAL_COMMUNITY): Payer: Self-pay | Admitting: Vascular Surgery

## 2024-04-03 DIAGNOSIS — I743 Embolism and thrombosis of arteries of the lower extremities: Secondary | ICD-10-CM

## 2024-04-03 DIAGNOSIS — Z87891 Personal history of nicotine dependence: Secondary | ICD-10-CM

## 2024-04-03 DIAGNOSIS — Z794 Long term (current) use of insulin: Secondary | ICD-10-CM

## 2024-04-03 DIAGNOSIS — I97638 Postprocedural hematoma of a circulatory system organ or structure following other circulatory system procedure: Secondary | ICD-10-CM

## 2024-04-03 DIAGNOSIS — I97618 Postprocedural hemorrhage and hematoma of a circulatory system organ or structure following other circulatory system procedure: Secondary | ICD-10-CM

## 2024-04-03 DIAGNOSIS — Z7902 Long term (current) use of antithrombotics/antiplatelets: Secondary | ICD-10-CM

## 2024-04-03 DIAGNOSIS — I10 Essential (primary) hypertension: Secondary | ICD-10-CM

## 2024-04-03 DIAGNOSIS — R310 Gross hematuria: Secondary | ICD-10-CM

## 2024-04-03 DIAGNOSIS — E1165 Type 2 diabetes mellitus with hyperglycemia: Secondary | ICD-10-CM

## 2024-04-03 DIAGNOSIS — I70221 Atherosclerosis of native arteries of extremities with rest pain, right leg: Secondary | ICD-10-CM | POA: Diagnosis not present

## 2024-04-03 DIAGNOSIS — Z95820 Peripheral vascular angioplasty status with implants and grafts: Secondary | ICD-10-CM

## 2024-04-03 DIAGNOSIS — E039 Hypothyroidism, unspecified: Secondary | ICD-10-CM

## 2024-04-03 DIAGNOSIS — T79A21A Traumatic compartment syndrome of right lower extremity, initial encounter: Secondary | ICD-10-CM

## 2024-04-03 DIAGNOSIS — E2740 Unspecified adrenocortical insufficiency: Secondary | ICD-10-CM

## 2024-04-03 DIAGNOSIS — Z9889 Other specified postprocedural states: Secondary | ICD-10-CM | POA: Diagnosis not present

## 2024-04-03 DIAGNOSIS — T82858A Stenosis of vascular prosthetic devices, implants and grafts, initial encounter: Secondary | ICD-10-CM

## 2024-04-03 DIAGNOSIS — I70222 Atherosclerosis of native arteries of extremities with rest pain, left leg: Secondary | ICD-10-CM | POA: Diagnosis not present

## 2024-04-03 LAB — POCT ACTIVATED CLOTTING TIME
Activated Clotting Time: 301 s
Activated Clotting Time: 148 s
Activated Clotting Time: 286 s
Activated Clotting Time: 204 s
Activated Clotting Time: 219 s

## 2024-04-03 LAB — GLUCOSE, CAPILLARY
Glucose-Capillary: 311 mg/dL — ABNORMAL HIGH (ref 70–99)
Glucose-Capillary: 225 mg/dL — ABNORMAL HIGH (ref 70–99)
Glucose-Capillary: 203 mg/dL — ABNORMAL HIGH (ref 70–99)
Glucose-Capillary: 221 mg/dL — ABNORMAL HIGH (ref 70–99)
Glucose-Capillary: 101 mg/dL — ABNORMAL HIGH (ref 70–99)
Glucose-Capillary: 256 mg/dL — ABNORMAL HIGH (ref 70–99)

## 2024-04-03 LAB — CBC
HCT: 38.6 % (ref 36.0–46.0)
HCT: 26.3 % — ABNORMAL LOW (ref 36.0–46.0)
Hemoglobin: 12.5 g/dL (ref 12.0–15.0)
Hemoglobin: 8.6 g/dL — ABNORMAL LOW (ref 12.0–15.0)
MCH: 30 pg (ref 26.0–34.0)
MCH: 30.4 pg (ref 26.0–34.0)
MCHC: 32.4 g/dL (ref 30.0–36.0)
MCHC: 32.7 g/dL (ref 30.0–36.0)
MCV: 92.8 fL (ref 80.0–100.0)
MCV: 92.9 fL (ref 80.0–100.0)
Platelets: 263 K/uL (ref 150–400)
Platelets: 192 K/uL (ref 150–400)
RBC: 4.16 MIL/uL (ref 3.87–5.11)
RBC: 2.83 MIL/uL — ABNORMAL LOW (ref 3.87–5.11)
RDW: 14.7 % (ref 11.5–15.5)
RDW: 14.8 % (ref 11.5–15.5)
WBC: 18.8 K/uL — ABNORMAL HIGH (ref 4.0–10.5)
WBC: 12.1 K/uL — ABNORMAL HIGH (ref 4.0–10.5)
nRBC: 0 % (ref 0.0–0.2)
nRBC: 0 % (ref 0.0–0.2)

## 2024-04-03 LAB — POCT I-STAT EG7
Acid-base deficit: 6 mmol/L — ABNORMAL HIGH (ref 0.0–2.0)
Bicarbonate: 19.2 mmol/L — ABNORMAL LOW (ref 20.0–28.0)
Calcium, Ion: 1.17 mmol/L (ref 1.15–1.40)
HCT: 26 % — ABNORMAL LOW (ref 36.0–46.0)
Hemoglobin: 8.8 g/dL — ABNORMAL LOW (ref 12.0–15.0)
O2 Saturation: 73 %
Potassium: 4.7 mmol/L (ref 3.5–5.1)
Sodium: 138 mmol/L (ref 135–145)
TCO2: 20 mmol/L — ABNORMAL LOW (ref 22–32)
pCO2, Ven: 38 mmHg — ABNORMAL LOW (ref 44–60)
pH, Ven: 7.312 (ref 7.25–7.43)
pO2, Ven: 42 mmHg (ref 32–45)

## 2024-04-03 LAB — FIBRINOGEN
Fibrinogen: 356 mg/dL (ref 210–475)
Fibrinogen: 303 mg/dL (ref 210–475)

## 2024-04-03 LAB — BASIC METABOLIC PANEL WITH GFR
Anion gap: 10 (ref 5–15)
BUN: 31 mg/dL — ABNORMAL HIGH (ref 8–23)
CO2: 19 mmol/L — ABNORMAL LOW (ref 22–32)
Calcium: 7.8 mg/dL — ABNORMAL LOW (ref 8.9–10.3)
Chloride: 108 mmol/L (ref 98–111)
Creatinine, Ser: 1.75 mg/dL — ABNORMAL HIGH (ref 0.44–1.00)
GFR, Estimated: 29 mL/min — ABNORMAL LOW (ref >60)
Glucose, Bld: 279 mg/dL — ABNORMAL HIGH (ref 70–99)
Potassium: 4.8 mmol/L (ref 3.5–5.1)
Sodium: 137 mmol/L (ref 135–145)

## 2024-04-03 LAB — CK: Total CK: 3103 U/L — ABNORMAL HIGH (ref 38–234)

## 2024-04-03 LAB — HEPARIN LEVEL (UNFRACTIONATED)
Heparin Unfractionated: 0.39 [IU]/mL (ref 0.30–0.70)
Heparin Unfractionated: <0.1 [IU]/mL — ABNORMAL LOW (ref 0.30–0.70)

## 2024-04-03 LAB — TROPONIN I (HIGH SENSITIVITY): Troponin I (High Sensitivity): 38 ng/L — ABNORMAL HIGH (ref <18)

## 2024-04-03 SURGERY — LYSIS RE-CHECK
Anesthesia: Monitor Anesthesia Care | Site: Groin | Laterality: Right

## 2024-04-03 SURGERY — GROIN EXPOSURE
Anesthesia: General | Site: Groin | Laterality: Right

## 2024-04-03 MED ORDER — ONDANSETRON HCL 4 MG/2ML IJ SOLN
INTRAMUSCULAR | Status: DC | PRN
  Administered 2024-04-03: 4 mg via INTRAVENOUS

## 2024-04-03 MED ORDER — 0.9 % SODIUM CHLORIDE (POUR BTL) OPTIME
TOPICAL | Status: DC | PRN
  Administered 2024-04-03: 1000 mL

## 2024-04-03 MED ORDER — LIDOCAINE HCL (CARDIAC) PF 100 MG/5ML IV SOSY
PREFILLED_SYRINGE | INTRAVENOUS | Status: DC | PRN
  Administered 2024-04-03: 60 mg via INTRAVENOUS

## 2024-04-03 MED ORDER — SODIUM CHLORIDE 0.9 % IV SOLN
1.0000 g | INTRAVENOUS | Status: DC
  Administered 2024-04-03 – 2024-04-05 (×3): 1 g via INTRAVENOUS
  Filled 2024-04-03 (×4): qty 10

## 2024-04-03 MED ORDER — PHENYLEPHRINE HCL-NACL 20-0.9 MG/250ML-% IV SOLN
INTRAVENOUS | Status: DC | PRN
  Administered 2024-04-03: 15 ug/min via INTRAVENOUS
  Administered 2024-04-03: 25 ug/min via INTRAVENOUS

## 2024-04-03 MED ORDER — HEPARIN 6000 UNIT IRRIGATION SOLUTION
Status: DC | PRN
  Administered 2024-04-03: 1

## 2024-04-03 MED ORDER — PROTAMINE SULFATE 10 MG/ML IV SOLN
INTRAVENOUS | Status: DC | PRN
  Administered 2024-04-03: 25 mg via INTRAVENOUS

## 2024-04-03 MED ORDER — ORAL CARE MOUTH RINSE: 15.0000 mL | Freq: Once | OROMUCOSAL | Status: AC

## 2024-04-03 MED ORDER — HEPARIN SODIUM (PORCINE) 1000 UNIT/ML IJ SOLN
INTRAMUSCULAR | Status: AC
  Filled 2024-04-03: qty 10

## 2024-04-03 MED ORDER — CLOPIDOGREL BISULFATE 75 MG PO TABS
75.0000 mg | ORAL_TABLET | Freq: Every day | ORAL | Status: DC
  Administered 2024-04-03 – 2024-04-05 (×3): 75 mg via ORAL
  Filled 2024-04-03 (×3): qty 1

## 2024-04-03 MED ORDER — CEFAZOLIN SODIUM-DEXTROSE 2-4 GM/100ML-% IV SOLN: 2.0000 g | Freq: Three times a day (TID) | INTRAVENOUS | Status: DC

## 2024-04-03 MED ORDER — ROCURONIUM BROMIDE 10 MG/ML (PF) SYRINGE
PREFILLED_SYRINGE | INTRAVENOUS | Status: DC | PRN
  Administered 2024-04-03: 60 mg via INTRAVENOUS
  Administered 2024-04-03: 10 mg via INTRAVENOUS

## 2024-04-03 MED ORDER — FENTANYL CITRATE (PF) 250 MCG/5ML IJ SOLN
INTRAMUSCULAR | Status: DC | PRN
  Administered 2024-04-03 (×2): 50 ug via INTRAVENOUS

## 2024-04-03 MED ORDER — ALBUMIN HUMAN 5 % IV SOLN: INTRAVENOUS | Status: DC | PRN

## 2024-04-03 MED ORDER — LIDOCAINE HCL (PF) 1 % IJ SOLN
INTRAMUSCULAR | Status: AC
  Filled 2024-04-03: qty 30

## 2024-04-03 MED ORDER — FENTANYL CITRATE (PF) 100 MCG/2ML IJ SOLN: 25.0000 ug | INTRAMUSCULAR | Status: DC | PRN

## 2024-04-03 MED ORDER — PROPOFOL 10 MG/ML IV BOLUS
INTRAVENOUS | Status: DC | PRN
  Administered 2024-04-03: 100 mg via INTRAVENOUS

## 2024-04-03 MED ORDER — PROPOFOL 10 MG/ML IV BOLUS
INTRAVENOUS | Status: AC
  Filled 2024-04-03: qty 20

## 2024-04-03 MED ORDER — FENTANYL CITRATE (PF) 100 MCG/2ML IJ SOLN
INTRAMUSCULAR | Status: AC
  Filled 2024-04-03: qty 2

## 2024-04-03 MED ORDER — LACTATED RINGERS IV SOLN: INTRAVENOUS | Status: DC

## 2024-04-03 MED ORDER — ACETAMINOPHEN 10 MG/ML IV SOLN: 1000.0000 mg | Freq: Once | INTRAVENOUS | Status: DC | PRN

## 2024-04-03 MED ORDER — PROPOFOL 500 MG/50ML IV EMUL
INTRAVENOUS | Status: DC | PRN
  Administered 2024-04-03 (×2): 20 mg via INTRAVENOUS
  Administered 2024-04-03: 50 ug/kg/min via INTRAVENOUS

## 2024-04-03 MED ORDER — GERHARDT'S BUTT CREAM
TOPICAL_CREAM | Freq: Two times a day (BID) | CUTANEOUS | Status: DC
  Filled 2024-04-03: qty 60

## 2024-04-03 MED ORDER — SODIUM CHLORIDE 0.9 % IV SOLN: 500.0000 mL | Freq: Once | INTRAVENOUS | Status: DC | PRN

## 2024-04-03 MED ORDER — POTASSIUM CHLORIDE CRYS ER 20 MEQ PO TBCR: 40.0000 meq | EXTENDED_RELEASE_TABLET | Freq: Every day | ORAL | Status: DC | PRN

## 2024-04-03 MED ORDER — PHENYLEPHRINE HCL-NACL 20-0.9 MG/250ML-% IV SOLN
INTRAVENOUS | Status: DC | PRN
  Administered 2024-04-03: 50 ug/min via INTRAVENOUS

## 2024-04-03 MED ORDER — SODIUM BICARBONATE 8.4 % IV SOLN
INTRAVENOUS | Status: AC
  Filled 2024-04-03: qty 50

## 2024-04-03 MED ORDER — SODIUM BICARBONATE 8.4 % IV SOLN
INTRAVENOUS | Status: DC | PRN
  Administered 2024-04-03: 25 meq via INTRAVENOUS

## 2024-04-03 MED ORDER — HYDROMORPHONE HCL 1 MG/ML IJ SOLN
0.5000 mg | INTRAMUSCULAR | Status: DC | PRN
  Administered 2024-04-03: 0.5 mg via INTRAVENOUS
  Filled 2024-04-03: qty 0.5

## 2024-04-03 MED ORDER — SODIUM CHLORIDE 0.9 % IV SOLN: INTRAVENOUS | Status: AC

## 2024-04-03 MED ORDER — IODIXANOL 320 MG/ML IV SOLN
INTRAVENOUS | Status: DC | PRN
  Administered 2024-04-03: 15 mL via INTRA_ARTERIAL

## 2024-04-03 MED ORDER — INSULIN ASPART 100 UNIT/ML IJ SOLN
0.0000 [IU] | INTRAMUSCULAR | Status: DC
  Administered 2024-04-03: 3 [IU] via SUBCUTANEOUS
  Administered 2024-04-03: 5 [IU] via SUBCUTANEOUS
  Administered 2024-04-04: 2 [IU] via SUBCUTANEOUS
  Administered 2024-04-04: 7 [IU] via SUBCUTANEOUS
  Administered 2024-04-04: 2 [IU] via SUBCUTANEOUS
  Administered 2024-04-04 (×2): 3 [IU] via SUBCUTANEOUS
  Administered 2024-04-04: 5 [IU] via SUBCUTANEOUS
  Administered 2024-04-05 (×2): 2 [IU] via SUBCUTANEOUS
  Administered 2024-04-05: 17:00:00 5 [IU] via SUBCUTANEOUS
  Administered 2024-04-05 – 2024-04-06 (×3): 3 [IU] via SUBCUTANEOUS
  Administered 2024-04-06 (×2): 1 [IU] via SUBCUTANEOUS
  Filled 2024-04-03: qty 3
  Filled 2024-04-03 (×2): qty 2
  Filled 2024-04-03 (×3): qty 3
  Filled 2024-04-03 (×2): qty 1
  Filled 2024-04-03 (×2): qty 5
  Filled 2024-04-03: qty 2
  Filled 2024-04-03: qty 5
  Filled 2024-04-03 (×2): qty 3
  Filled 2024-04-03: qty 2
  Filled 2024-04-03: qty 7

## 2024-04-03 MED ORDER — HYDROCORTISONE 5 MG PO TABS
5.0000 mg | ORAL_TABLET | Freq: Every day | ORAL | Status: DC
  Administered 2024-04-03 – 2024-04-05 (×3): 5 mg via ORAL
  Filled 2024-04-03 (×4): qty 1

## 2024-04-03 MED ORDER — HYDROMORPHONE HCL 1 MG/ML IJ SOLN
1.0000 mg | INTRAMUSCULAR | Status: DC | PRN
  Administered 2024-04-03 – 2024-04-04 (×4): 1 mg via INTRAVENOUS
  Filled 2024-04-03 (×4): qty 1

## 2024-04-03 MED ORDER — HEPARIN 6000 UNIT IRRIGATION SOLUTION
Status: AC
  Filled 2024-04-03: qty 500

## 2024-04-03 MED ORDER — CHLORHEXIDINE GLUCONATE 0.12 % MT SOLN
OROMUCOSAL | Status: AC
  Filled 2024-04-03: qty 15

## 2024-04-03 MED ORDER — PHENYLEPHRINE 80 MCG/ML (10ML) SYRINGE FOR IV PUSH (FOR BLOOD PRESSURE SUPPORT)
PREFILLED_SYRINGE | INTRAVENOUS | Status: AC
  Filled 2024-04-03: qty 10

## 2024-04-03 MED ORDER — IODIXANOL 320 MG/ML IV SOLN
INTRAVENOUS | Status: DC | PRN
  Administered 2024-04-03: 85 mL via INTRA_ARTERIAL

## 2024-04-03 MED ORDER — VASOPRESSIN 20 UNIT/ML IV SOLN
INTRAVENOUS | Status: DC | PRN
  Administered 2024-04-03 (×4): .5 [IU] via INTRAVENOUS
  Administered 2024-04-03: 2 [IU] via INTRAVENOUS

## 2024-04-03 MED ORDER — ACETAMINOPHEN 325 MG PO TABS
325.0000 mg | ORAL_TABLET | ORAL | Status: DC | PRN
  Administered 2024-04-04: 650 mg via ORAL
  Filled 2024-04-03: qty 2

## 2024-04-03 MED ORDER — SURGIFLO WITH THROMBIN (HEMOSTATIC MATRIX KIT) OPTIME
TOPICAL | Status: DC | PRN
  Administered 2024-04-03: 1

## 2024-04-03 MED ORDER — CHLORHEXIDINE GLUCONATE 0.12 % MT SOLN
15.0000 mL | Freq: Once | OROMUCOSAL | Status: AC
  Administered 2024-04-03: 15 mL via OROMUCOSAL

## 2024-04-03 MED ORDER — LEVOTHYROXINE SODIUM 75 MCG PO TABS
75.0000 ug | ORAL_TABLET | Freq: Every day | ORAL | Status: DC
  Administered 2024-04-04 – 2024-04-06 (×3): 75 ug via ORAL
  Filled 2024-04-03 (×3): qty 1

## 2024-04-03 MED ORDER — VASOPRESSIN 20 UNIT/ML IV SOLN
INTRAVENOUS | Status: AC
  Filled 2024-04-03: qty 1

## 2024-04-03 MED ORDER — FENTANYL CITRATE (PF) 100 MCG/2ML IJ SOLN
INTRAMUSCULAR | Status: DC | PRN
  Administered 2024-04-03 (×2): 50 ug via INTRAVENOUS

## 2024-04-03 MED ORDER — HEPARIN SODIUM (PORCINE) 1000 UNIT/ML IJ SOLN
INTRAMUSCULAR | Status: DC | PRN
  Administered 2024-04-03: 6000 [IU] via INTRAVENOUS
  Administered 2024-04-03: 2000 [IU] via INTRAVENOUS

## 2024-04-03 MED ORDER — DEXAMETHASONE SOD PHOSPHATE PF 10 MG/ML IJ SOLN
INTRAMUSCULAR | Status: DC | PRN
  Administered 2024-04-03: 5 mg via INTRAVENOUS

## 2024-04-03 MED ORDER — HEPARIN SODIUM (PORCINE) 1000 UNIT/ML IJ SOLN
INTRAMUSCULAR | Status: DC | PRN
  Administered 2024-04-03: 6000 [IU] via INTRAVENOUS

## 2024-04-03 MED ORDER — ACETAMINOPHEN 650 MG RE SUPP: 325.0000 mg | RECTAL | Status: DC | PRN

## 2024-04-03 MED ORDER — SUGAMMADEX SODIUM 200 MG/2ML IV SOLN
INTRAVENOUS | Status: DC | PRN
  Administered 2024-04-03: 150 mg via INTRAVENOUS

## 2024-04-03 MED ORDER — INSULIN ASPART 100 UNIT/ML IJ SOLN
INTRAMUSCULAR | Status: DC | PRN
  Administered 2024-04-03: 3 [IU] via SUBCUTANEOUS

## 2024-04-03 MED ORDER — PHENYLEPHRINE HCL (PRESSORS) 10 MG/ML IV SOLN
INTRAVENOUS | Status: DC | PRN
  Administered 2024-04-03 (×2): 240 ug via INTRAVENOUS
  Administered 2024-04-03: 80 ug via INTRAVENOUS

## 2024-04-03 MED ORDER — HYDROMORPHONE HCL 1 MG/ML IJ SOLN
0.5000 mg | Freq: Once | INTRAMUSCULAR | Status: AC
  Administered 2024-04-03: 0.5 mg via INTRAVENOUS
  Filled 2024-04-03: qty 0.5

## 2024-04-03 MED ORDER — PHENYLEPHRINE HCL (PRESSORS) 10 MG/ML IV SOLN
INTRAVENOUS | Status: DC | PRN
  Administered 2024-04-03 (×3): 80 ug via INTRAVENOUS

## 2024-04-03 MED ORDER — FENTANYL CITRATE (PF) 100 MCG/2ML IJ SOLN
25.0000 ug | INTRAMUSCULAR | Status: DC | PRN
  Administered 2024-04-03: 50 ug via INTRAVENOUS

## 2024-04-03 MED ORDER — HYDROCORTISONE 10 MG PO TABS
10.0000 mg | ORAL_TABLET | Freq: Every day | ORAL | Status: DC
  Administered 2024-04-04 – 2024-04-06 (×3): 10 mg via ORAL
  Filled 2024-04-03 (×3): qty 1

## 2024-04-03 MED ORDER — LIDOCAINE HCL (PF) 1 % IJ SOLN: INTRAMUSCULAR | Status: DC | PRN

## 2024-04-03 MED ORDER — LIDOCAINE 2% (20 MG/ML) 5 ML SYRINGE
INTRAMUSCULAR | Status: AC
  Filled 2024-04-03: qty 5

## 2024-04-03 MED ORDER — CEFAZOLIN SODIUM-DEXTROSE 2-3 GM-%(50ML) IV SOLR
INTRAVENOUS | Status: DC | PRN
  Administered 2024-04-03: 2 g via INTRAVENOUS

## 2024-04-03 MED ORDER — DROPERIDOL 2.5 MG/ML IJ SOLN: 0.6250 mg | Freq: Once | INTRAMUSCULAR | Status: DC | PRN

## 2024-04-03 MED ORDER — ASPIRIN 81 MG PO TBEC
81.0000 mg | DELAYED_RELEASE_TABLET | Freq: Every day | ORAL | Status: DC
  Administered 2024-04-03 – 2024-04-06 (×4): 81 mg via ORAL
  Filled 2024-04-03 (×4): qty 1

## 2024-04-03 MED ORDER — ROCURONIUM BROMIDE 10 MG/ML (PF) SYRINGE
PREFILLED_SYRINGE | INTRAVENOUS | Status: AC
  Filled 2024-04-03: qty 10

## 2024-04-03 MED ORDER — PHENOL 1.4 % MT LIQD: 1.0000 | OROMUCOSAL | Status: DC | PRN

## 2024-04-03 SURGICAL SUPPLY — 57 items
BAG BANDED W/RUBBER/TAPE 36X54 (MISCELLANEOUS) ×3 IMPLANT
BAG COUNTER SPONGE SURGICOUNT (BAG) ×3 IMPLANT
BAG SNAP BAND KOVER 36X36 (MISCELLANEOUS) ×3 IMPLANT
BALLN STERLING OTW 2.5X220X150 (BALLOONS) IMPLANT
BALLOON MUSTANG 5X150X135 (BALLOONS) IMPLANT
BALLOON STERLING OTW 2X150X150 (BALLOONS) IMPLANT
BLADE SURG 11 STRL SS (BLADE) ×3 IMPLANT
CANISTER SUCTION 3000ML PPV (SUCTIONS) ×3 IMPLANT
CATH ANGIO 5F BER2 100CM (CATHETERS) IMPLANT
CATH ANGIO 5F BER2 65CM (CATHETERS) IMPLANT
CATH OMNI FLUSH .035X70CM (CATHETERS) IMPLANT
CATH OMNI FLUSH 5F 65CM (CATHETERS) ×3 IMPLANT
CATH VISIONS PV .035 IVUS (CATHETERS) IMPLANT
CLOSURE MYNX CONTROL 6F/7F (Vascular Products) IMPLANT
CLOSURE PERCLOSE PROSTYLE (Vascular Products) IMPLANT
COVER PROBE W GEL 5X96 (DRAPES) ×3 IMPLANT
COVER SURGICAL LIGHT HANDLE (MISCELLANEOUS) ×3 IMPLANT
DERMABOND ADVANCED .7 DNX12 (GAUZE/BANDAGES/DRESSINGS) ×6 IMPLANT
DEVICE TORQUE H2O (MISCELLANEOUS) IMPLANT
DRAPE SURG ORHT 6 SPLT 77X108 (DRAPES) IMPLANT
ELECTRODE REM PT RTRN 9FT ADLT (ELECTROSURGICAL) IMPLANT
FILTER CO2 0.2 MICRON (VASCULAR PRODUCTS) IMPLANT
FILTER CO2 INSUFFLATOR AX1008 (MISCELLANEOUS) IMPLANT
GAUZE 4X4 16PLY ~~LOC~~+RFID DBL (SPONGE) ×3 IMPLANT
GLOVE BIOGEL PI IND STRL 8 (GLOVE) ×3 IMPLANT
GLOVE INDICATOR 7.5 STRL GRN (GLOVE) ×3 IMPLANT
GLOVE SURG SS PI 7.5 STRL IVOR (GLOVE) ×3 IMPLANT
GOWN STRL REUS W/ TWL LRG LVL3 (GOWN DISPOSABLE) ×6 IMPLANT
GOWN STRL REUS W/ TWL XL LVL3 (GOWN DISPOSABLE) ×3 IMPLANT
GUIDEWIRE ANGLED .035X150CM (WIRE) IMPLANT
GUIDEWIRE BENTSON (WIRE) IMPLANT
KIT BASIN OR (CUSTOM PROCEDURE TRAY) ×3 IMPLANT
KIT ENCORE 26 ADVANTAGE (KITS) IMPLANT
KIT TURNOVER KIT B (KITS) ×3 IMPLANT
PACK ENDO MINOR (CUSTOM PROCEDURE TRAY) ×3 IMPLANT
PACK SRG BSC III STRL LF ECLPS (CUSTOM PROCEDURE TRAY) ×3 IMPLANT
PAD ARMBOARD POSITIONER FOAM (MISCELLANEOUS) ×6 IMPLANT
SET FLUSH CO2 (MISCELLANEOUS) IMPLANT
SET MICROPUNCTURE 5F STIFF (MISCELLANEOUS) ×3 IMPLANT
SHEATH AVANTI 11CM 5FR (SHEATH) IMPLANT
SHEATH PINNACLE 6F 10CM (SHEATH) IMPLANT
SOLN 0.9% NACL POUR BTL 1000ML (IV SOLUTION) IMPLANT
SOLN STERILE WATER BTL 1000 ML (IV SOLUTION) IMPLANT
STENT ELUVIA 6X150X130 (Permanent Stent) IMPLANT
STOPCOCK MORSE 400PSI 3WAY (MISCELLANEOUS) ×3 IMPLANT
SURGIFLO W/THROMBIN 8M KIT (HEMOSTASIS) IMPLANT
SYR 10ML LL (SYRINGE) IMPLANT
SYR 20ML LL LF (SYRINGE) ×3 IMPLANT
SYR 3ML LL SCALE MARK (SYRINGE) IMPLANT
SYR CONTROL 10ML LL (SYRINGE) IMPLANT
SYR MEDRAD MARK V 150ML (SYRINGE) IMPLANT
TOWEL GREEN STERILE (TOWEL DISPOSABLE) ×6 IMPLANT
TOWEL GREEN STERILE FF (TOWEL DISPOSABLE) IMPLANT
TUBING HIGH PRESSURE 120CM (CONNECTOR) ×3 IMPLANT
WIRE AMPLATZ SS-J .035X260CM (WIRE) IMPLANT
WIRE BENTSON .035X145CM (WIRE) ×3 IMPLANT
WIRE G V18X300 ST (WIRE) IMPLANT

## 2024-04-03 SURGICAL SUPPLY — 37 items
BAG ISOLATION DRAPE 18X18 (DRAPES) IMPLANT
CANISTER SUCTION 3000ML PPV (SUCTIONS) ×4 IMPLANT
CLIP TI MEDIUM 24 (CLIP) ×4 IMPLANT
CLIP TI WIDE RED SMALL 24 (CLIP) ×4 IMPLANT
DERMABOND ADVANCED .7 DNX12 (GAUZE/BANDAGES/DRESSINGS) ×4 IMPLANT
DRSG COVADERM 4X6 (GAUZE/BANDAGES/DRESSINGS) IMPLANT
DRSG COVADERM 4X8 (GAUZE/BANDAGES/DRESSINGS) IMPLANT
GLOVE BIOGEL PI IND STRL 8 (GLOVE) ×4 IMPLANT
GLOVE INDICATOR 7.5 STRL GRN (GLOVE) ×4 IMPLANT
GLOVE SURG SS PI 7.5 STRL IVOR (GLOVE) ×4 IMPLANT
GOWN STRL REUS W/ TWL LRG LVL3 (GOWN DISPOSABLE) ×8 IMPLANT
GOWN STRL REUS W/ TWL XL LVL3 (GOWN DISPOSABLE) ×8 IMPLANT
GRAFT SKIN WND OMEGA3 3X7 (Tissue) IMPLANT
GRAFT VASC PATCH XENOSURE 1X14 (Vascular Products) IMPLANT
KIT BASIN OR (CUSTOM PROCEDURE TRAY) ×4 IMPLANT
KIT TURNOVER KIT B (KITS) ×4 IMPLANT
PACK PERIPHERAL VASCULAR (CUSTOM PROCEDURE TRAY) ×4 IMPLANT
PAD ARMBOARD POSITIONER FOAM (MISCELLANEOUS) ×4 IMPLANT
SET MICROPUNCTURE 5F STIFF (MISCELLANEOUS) IMPLANT
SOLN 0.9% NACL POUR BTL 1000ML (IV SOLUTION) ×4 IMPLANT
SOLN STERILE WATER BTL 1000 ML (IV SOLUTION) ×4 IMPLANT
SPONGE INTESTINAL PEANUT (DISPOSABLE) IMPLANT
STAPLER SKIN PROX 35W (STAPLE) IMPLANT
STOPCOCK 3WAY HIGH PRESSURE (MISCELLANEOUS) IMPLANT
STOPCOCK 4WAY DISCOFIX (IV SETS) IMPLANT
SURGIFLO W/THROMBIN 8M KIT (HEMOSTASIS) IMPLANT
SUT PROLENE 5 0 C 1 24 (SUTURE) ×4 IMPLANT
SUT PROLENE 5 0 C1 (SUTURE) IMPLANT
SUT PROLENE 6 0 BV (SUTURE) ×4 IMPLANT
SUT PROLENE 7 0 BV1 MDA (SUTURE) IMPLANT
SUT VIC AB 2-0 CT1 TAPERPNT 27 (SUTURE) ×4 IMPLANT
SUT VIC AB 3-0 SH 27X BRD (SUTURE) ×4 IMPLANT
SUT VIC AB 4-0 PS2 18 (SUTURE) ×4 IMPLANT
SYR 3ML LL SCALE MARK (SYRINGE) IMPLANT
TOWEL GREEN STERILE (TOWEL DISPOSABLE) ×4 IMPLANT
TUBE SUCTION CARDIAC 10FR (CANNULA) IMPLANT
TUBING CIL FLEX 10 FLL-RA (TUBING) IMPLANT

## 2024-04-03 NOTE — Progress Notes (Signed)
 Patient currently comfortable Brisk right peroneal signal, biphasic left AT signal Will consider anticoagulation tomorrow    Audrey [name]

## 2024-04-03 NOTE — Anesthesia Postprocedure Evaluation (Signed)
 Anesthesia Post Note  Patient: Jenkins SAUNDERS Stofko  Procedure(s) Performed: LYSIS RE-CHECK (Left: Groin) ANGIOGRAM, LOWER EXTREMITY WITH PERONEAL AND TRUNK BALLOOON ANGIOPLASTY (Left: Groin) INSERTION, SUPERFICIAL FEMORAL ARTERY STENT, VASCULAR, LOWER EXTREMITY (Right: Groin)     Patient location during evaluation: PACU Anesthesia Type: MAC Level of consciousness: awake and alert Pain management: pain level controlled Vital Signs Assessment: post-procedure vital signs reviewed and stable Respiratory status: spontaneous breathing, nonlabored ventilation, respiratory function stable and patient connected to nasal cannula oxygen Cardiovascular status: stable and blood pressure returned to baseline Postop Assessment: no apparent nausea or vomiting Anesthetic complications: no   No notable events documented.  Last Vitals:  Vitals:   04/03/24 1645 04/03/24 1700  BP: 119/62 97/65  Pulse: 85 85  Resp: (!) 25 15  Temp:    SpO2: 97% 94%    Last Pain:  Vitals:   04/03/24 1728  TempSrc:   PainSc: 10-Worst pain ever                 Cordella P Wasyl Dornfeld

## 2024-04-03 NOTE — Progress Notes (Signed)
 Pt transported to OR via OR RN @1244 

## 2024-04-03 NOTE — Progress Notes (Signed)
Attempted to call report to short stay. 

## 2024-04-03 NOTE — Progress Notes (Addendum)
 ANTICOAGULATION CONSULT NOTE  Pharmacy Consult for heparin  Indication: CLI  Allergies[1]  Patient Measurements: Height: 4' 10 (147.3 cm) Weight: 54.4 kg (120 lb) IBW/kg (Calculated) : 40.9 Heparin  Dosing Weight: 52 kg   Vital Signs: Temp: 98.9 F (37.2 C) (12/13 0300) Temp Source: Oral (12/13 0300) BP: 130/52 (12/13 0500) Pulse Rate: 89 (12/13 0500)  Labs: Recent Labs    04/02/24 0852 04/02/24 0857 04/02/24 0917 04/02/24 1417 04/02/24 1822 04/02/24 1840 04/03/24 0315  HGB 14.6 13.9  --   --  13.4  --  12.5  HCT 43.0 41.0  --   --  40.4  --  38.6  PLT  --   --   --   --  270  --  263  HEPARINUNFRC  --   --   --  0.58  --  0.25* 0.39  CREATININE 1.80* 1.70* 1.69*  --   --   --   --     Estimated Creatinine Clearance: 18.8 mL/min (A) (by C-G formula based on SCr of 1.69 mg/dL (H)).  Medical History: Past Medical History:  Diagnosis Date   Anemia    Arthritis    lower back, hips, hands   Biliary stricture (HCC)    Diabetes mellitus (HCC)    type 2    Early cataracts, bilateral    Md just watching   Elevated liver enzymes    Family history of adverse reaction to anesthesia    Daughter hard to wake up   Gallstones    GERD (gastroesophageal reflux disease)    occasional - diet controlled   History of blood transfusion 2018   History of hiatal hernia    HTN (hypertension)    Hyperlipidemia    Hypothyroidism    left renal ca dx'd 2018   renal cancer - left kidney removed, pill chemo x 1 yr   Myocardial infarction (HCC) 1991   no deficits   Peripheral vascular disease    SVD (spontaneous vaginal delivery)    x 3   Wears glasses     Medications:  See MAR  Assessment: 59 yoF presented to [facility] 12/11 with pain/coldness of R foot for past 4 days.  PMH significant for right SFA stenting 10/2022 with recent thrombosis of SFA stent s/p laser atherectomy, balloon angioplasty and stenting of R SFA, popliteal, and TP trunk 12/29/2023.  S/p RLE angioplasty and  initiation of cath-directed lytics.  Returned to ICU with heparin  IV 800 units/hr and alteplase  1 mg/h running.  Heparin  consult placed to continue current rate, titrations per MD.  04/03/24 AM: Heparin  level 0.39, therapeutic at heparin  800 units/hr. No issues with infusion running or signs of bleeding per RN. CBC stable (Hgb 12.5, PLT 263).  Fibrinogen stable at 356. Plan for patient to return to OR this morning.  Goal of Therapy:  Heparin  level 0.3-0.7 units/ml Monitor platelets by anticoagulation protocol: Yes   Plan:  Heparin  infusion 800 units/hr Q6h CBC, heparin  level, fibrinogen No titrations unless specified by MD Monitor for s/sx of bleeding F/u heparin  plan after returning from OR  Morna Breach, PharmD, BCPS PGY2 Cardiology Pharmacy Resident 04/03/2024 6:25 AM     [1]  Allergies Allergen Reactions   Doxycycline Nausea And Vomiting   Adhesive [Tape] Other (See Comments)    Tears skin off Paper tape is ok per patient   Cymbalta [Duloxetine Hcl]    Duloxetine Other (See Comments)    Feel woozy   Gabapentin  (Once-Daily) Other (See Comments)  Feels loopy   Nsaids Other (See Comments)    Pt only has one kidney    Statins Other (See Comments)    Liver/kidney issues.

## 2024-04-03 NOTE — Progress Notes (Signed)
 Giving ceftriaxone  for UTI; with this can d/c cefazolin .  Audrey SHAUNNA Gaskins, DO 04/03/2024 5:51 PM [facility]  For contact information, see Amion. If no response to pager, please call PCCM consult pager. After hours, 7PM- 7AM, please call Elink.

## 2024-04-03 NOTE — Progress Notes (Signed)
Report called to Pre-Op.

## 2024-04-03 NOTE — H&P (Signed)
° ° °  Subjective  - POD #1  No complaints   Physical Exam:  Left groin soft       Assessment/Plan:  POD #1  Plan for follow up lysis study today  Wells Doree Kuehne 04/03/2024 8:26 AM --  Vitals:   04/03/24 0800 04/03/24 0805  BP: (!) 121/56 137/83  Pulse: 84 85  Resp: 16 16  Temp:  98.9 F (37.2 C)  SpO2: 98% 96%    Intake/Output Summary (Last 24 hours) at 04/03/2024 0826 Last data filed at 04/03/2024 0600 Gross per 24 hour  Intake 1210.22 ml  Output 600 ml  Net 610.22 ml     Laboratory CBC    Component Value Date/Time   WBC 18.8 (H) 04/03/2024 0315   HGB 12.5 04/03/2024 0315   HGB 12.6 12/03/2023 1359   HGB 11.2 (L) 04/04/2017 0830   HCT 38.6 04/03/2024 0315   HCT 35.2 04/04/2017 0830   PLT 263 04/03/2024 0315   PLT 233 12/03/2023 1359   PLT 250 04/04/2017 0830    BMET    Component Value Date/Time   NA 136 04/02/2024 0917   NA 140 04/04/2017 0830   K 4.3 04/02/2024 0917   K 4.1 04/04/2017 0830   CL 107 04/02/2024 0917   CO2 20 (L) 04/02/2024 0917   CO2 21 (L) 04/04/2017 0830   GLUCOSE 278 (H) 04/02/2024 0917   GLUCOSE 107 04/04/2017 0830   BUN 43 (H) 04/02/2024 0917   BUN 37.0 (H) 04/04/2017 0830   CREATININE 1.69 (H) 04/02/2024 0917   CREATININE 1.67 (H) 12/03/2023 1359   CREATININE 2.2 (H) 04/04/2017 0830   CALCIUM  9.8 04/02/2024 0917   CALCIUM  10.1 04/04/2017 0830   GFRNONAA 30 (L) 04/02/2024 0917   GFRNONAA 31 (L) 12/03/2023 1359   GFRAA 30 (L) 01/19/2020 0835    COAG Lab Results  Component Value Date   INR 1.1 09/01/2018   INR 0.87 01/08/2018   INR 1.04 06/27/2016   No results found for: PTT  Antibiotics Anti-infectives (From admission, onward)    None        V. Malvina Serene CLORE, M.D., [facility] [place] Office: [phone] Pager:  [phone]

## 2024-04-03 NOTE — Anesthesia Procedure Notes (Signed)
 Procedure Name: Intubation Date/Time: 04/03/2024 1:00 PM  Performed by: Cindie Donald CROME, CRNAPre-anesthesia Checklist: Patient identified, Emergency Drugs available, Suction available and Patient being monitored Patient Re-evaluated:Patient Re-evaluated prior to induction Oxygen Delivery Method: Circle System Utilized Preoxygenation: Pre-oxygenation with 100% oxygen Induction Type: IV induction Ventilation: Mask ventilation without difficulty Laryngoscope Size: Mac and 3 Grade View: Grade I Tube type: Oral Tube size: 7.0 mm Number of attempts: 1 Airway Equipment and Method: Stylet Placement Confirmation: ETT inserted through vocal cords under direct vision, positive ETCO2 and breath sounds checked- equal and bilateral Secured at: 21 cm Tube secured with: Tape Dental Injury: Teeth and Oropharynx as per pre-operative assessment

## 2024-04-03 NOTE — Anesthesia Preprocedure Evaluation (Signed)
 Anesthesia Evaluation  Patient identified by MRN, date of birth, ID band Patient awake    Reviewed: Allergy & Precautions, NPO status , Patient's Chart, lab work & pertinent test results  History of Anesthesia Complications (+) PONV and history of anesthetic complications  Airway Mallampati: II  TM Distance: >3 FB Neck ROM: Full    Dental no notable dental hx.    Pulmonary former smoker   Pulmonary exam normal        Cardiovascular hypertension, Pt. on medications and Pt. on home beta blockers + CAD, + Past MI and + Peripheral Vascular Disease   Rhythm:Regular Rate:Normal     Neuro/Psych negative neurological ROS  negative psych ROS   GI/Hepatic Neg liver ROS, hiatal hernia,GERD  Medicated,,  Endo/Other  diabetes, Type 2Hypothyroidism    Renal/GU Renal disease  negative genitourinary   Musculoskeletal  (+) Arthritis , Osteoarthritis,    Abdominal Normal abdominal exam  (+)   Peds  Hematology  (+) Blood dyscrasia, anemia Lab Results      Component                Value               Date                      WBC                      18.8 (H)            04/03/2024                HGB                      12.5                04/03/2024                HCT                      38.6                04/03/2024                MCV                      92.8                04/03/2024                PLT                      263                 04/03/2024              Anesthesia Other Findings   Reproductive/Obstetrics                              Anesthesia Physical Anesthesia Plan  ASA: 3  Anesthesia Plan: General   Post-op Pain Management:    Induction: Intravenous  PONV Risk Score and Plan: 2 and Treatment may vary due to age or medical condition, Ondansetron  and Dexamethasone   Airway Management Planned: Mask and Oral ETT  Additional Equipment: None  Intra-op Plan:   Post-operative  Plan: Extubation in OR  Informed Consent: I  have reviewed the patients History and Physical, chart, labs and discussed the procedure including the risks, benefits and alternatives for the proposed anesthesia with the patient or authorized representative who has indicated his/her understanding and acceptance.     Dental advisory given  Plan Discussed with: CRNA  Anesthesia Plan Comments: (- s/p removal lysis catheter this am with persistent hematoma at site requiring re-exploration of groin )         Anesthesia Quick Evaluation

## 2024-04-03 NOTE — Anesthesia Postprocedure Evaluation (Signed)
 Anesthesia Post Note  Patient: Audrey [name]  Procedure(s) Performed: GROIN EXPOSURE (Left) FASCIOTOMY, 4 compartment right lower leg (Right) ANGIOPLASTY, USING PATCH GRAFT, LEFT COMMON FEMORAL ARTERY (Left: Groin) ENDARTERECTOMY, FEMORAL (Left: Groin) ANGIOGRAM, LOWER EXTREMITY     Patient location during evaluation: PACU Anesthesia Type: General Level of consciousness: awake and alert Pain management: pain level controlled Vital Signs Assessment: post-procedure vital signs reviewed and stable Respiratory status: spontaneous breathing, nonlabored ventilation, respiratory function stable and patient connected to nasal cannula oxygen Cardiovascular status: blood pressure returned to baseline and stable Postop Assessment: no apparent nausea or vomiting Anesthetic complications: no Comments: ST depressions from baseline. Troponins sent, CCM involved for post-op management   No notable events documented.  Last Vitals:  Vitals:   04/03/24 1645 04/03/24 1700  BP: 119/62 97/65  Pulse: 85 85  Resp: (!) 25 15  Temp:    SpO2: 97% 94%    Last Pain:  Vitals:   04/03/24 1728  TempSrc:   PainSc: 10-Worst pain ever                 Cordella P Geoge Lawrance

## 2024-04-03 NOTE — TOC CM/SW Note (Signed)
.  Transition of Care [facility]) - Inpatient Brief Assessment   Patient Details  Name: Audrey [name] MRN: [id number] Date of Birth: July 06, 1940  [facility] of [facility] CM/SW Contact:    Justina Delcia Czar, RN Phone Number: [phone] 04/03/2024, 7:58 AM   Clinical Narrative: Patient lives at home alone, with assistance from dtr. Pt has RW at home. Had [facility] in the past.   Chart reviewed for discharge readiness, patient not medically stable for d/c. Inpatient CM/CSW will continue to monitor pt's advancement through interdisciplinary progression rounds.   If new pt transition needs arise, MD please place a TOC consult.     Transition of Care Asessment: Insurance and Status: Insurance coverage has been reviewed Patient has primary care physician: Yes Home environment has been reviewed: home alone Prior level of function:: independent Prior/Current Home Services: No current home services Social Drivers of Health Review: SDOH reviewed no interventions necessary Readmission risk has been reviewed: Yes Transition of care needs: transition of care needs identified, TOC will continue to follow

## 2024-04-03 NOTE — Transfer of Care (Signed)
 Immediate Anesthesia Transfer of Care Note  Patient: Audrey [name]  Procedure(s) Performed: LYSIS RE-CHECK (Left: Groin) ANGIOGRAM, LOWER EXTREMITY WITH PERONEAL AND TRUNK BALLOOON ANGIOPLASTY (Left: Groin) INSERTION, SUPERFICIAL FEMORAL ARTERY STENT, VASCULAR, LOWER EXTREMITY (Right: Groin)  Patient Location: PACU  Anesthesia Type:MAC  Level of Consciousness: awake, alert , oriented, and patient cooperative  Airway & Oxygen Therapy: Patient Spontanous Breathing and Patient connected to face mask oxygen  Post-op Assessment: Report given to RN and Post -op Vital signs reviewed and stable  Post vital signs: Reviewed and stable  Last Vitals:  Vitals Value Taken Time  BP 98/48 04/03/24 12:03  Temp    Pulse 79 04/03/24 12:10  Resp 18 04/03/24 12:11  SpO2 96 % 04/03/24 12:10  Vitals shown include unfiled device data.  Last Pain:  Vitals:   04/03/24 0805  TempSrc: Oral  PainSc:          Complications: No notable events documented.

## 2024-04-03 NOTE — Op Note (Signed)
° ° °  Patient name: Audrey [name] MRN: [id number] DOB: [birth year] Sex: female  04/02/2024 - 04/03/2024 Pre-operative Diagnosis: Follow-up lysis Post-operative diagnosis:  Same Surgeon:  [name] Procedure Performed:  1.  Follow-up lysis with right leg angiogram  2.  Angioplasty, right peroneal artery and tibioperoneal trunk  3.  Stent, right superficial femoral artery  4.  Closure device, Mynx  5.  Selective injection with catheter in the peroneal artery    Indications: This is an 83 year old female who presented with ischemic changes to the right foot.  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  A catheter was left in the peroneal artery for overnight lysis  Procedure:  The patient was identified in the holding area and taken to room 16.  The patient was then placed supine on the table and prepped and draped in the usual sterile fashion.  A time out was called.  MAC anesthesia was administered.  The EKOS catheter was removed and a selective injection was performed with catheter in the peroneal artery which showed distal occlusion and minimal perfusion defect.  I then inserted a V18 wire and removed the lysis catheter.  Right leg angiogram was performed.  There was narrowing at the leading edge of the popliteal stent.  The patient was given a bolus of heparin  and ACT's were monitored.  I advanced the wire into the distal peroneal artery and performed balloon angioplasty of the peroneal artery and tibioperoneal trunk with a 2 mm balloon.  Follow-up imaging showed opacification of the peroneal artery with several areas of stenosis and so I then inserted a 2.5 x 200 balloon and repeated balloon angioplasty of the tibioperoneal trunk, peroneal artery.  Completion imaging now showed inline flow through the peroneal artery which much better perfusion of the foot.  The anterior tibial artery is occluded proximally and then reconstitutes however the popliteal  stent covers the origin of the anterior tibial artery.  I then performed stenting of a 80% stenosis at the leading edge of the existing SFA stent.  I placed a 6 x 150 Eluvia and then postdilated this with a 5 mm balloon.  Completion imaging showed inline flow through the SFA, popliteal, tibioperoneal trunk and peroneal artery with no obvious stenosis.  At this point I felt the patient was optimally revascularized.  I exchanged the long 6 French sheath out for a short 6 French sheath.  Radial artery injection was performed evaluating the access site which was at an area of heavy calcification.  I attempted to insert a Pro-glide to close the artery however I did not feel that this was safe due to concerns of the plaque at the access site.  I ended up closing the artery with a Mynx.  She did develop a hematoma and so we held manual pressure    Impression:  #1  Successful angioplasty of the peroneal and tibioperoneal trunk with a 2.5 balloon  #2  Successful stenting of a 80% right SFA stenosis with a 6 x 150 Eluvia  #3  Patient is optimally revascularized.   ALONSO [name], M.D., [facility] [place] Office: [phone] Pager:  [phone]

## 2024-04-03 NOTE — Progress Notes (Signed)
 Patient is post ablation study with angioplasty to the peroneal artery and today.  After the procedure, we had issues with bleeding from the left groin so pressure was held for prolonged period of time.  Ultimately this stopped and she was taken to the recovery room.  She had Doppler signals in both legs before she left the operating room however she does not have Doppler signals on the left leg at this time.  She is complaining of pain in her left groin.  Cannot get Doppler signal in the left groin but she has a palpable femoral pulse.  In addition, she is complaining of pain in her right calf.  This is very tender to palpation and I am concerned that she is developing a compartment syndrome.  I think she needs to get back to the operating room for fasciotomies of the right leg and excoriation of the left groin.  Audrey [name]

## 2024-04-03 NOTE — Anesthesia Preprocedure Evaluation (Signed)
 Anesthesia Evaluation  Patient identified by MRN, date of birth, ID band Patient awake    Reviewed: Allergy & Precautions, NPO status , Patient's Chart, lab work & pertinent test results  Airway Mallampati: II  TM Distance: >3 FB Neck ROM: Full    Dental no notable dental hx.    Pulmonary former smoker   Pulmonary exam normal        Cardiovascular hypertension, Pt. on medications and Pt. on home beta blockers + CAD, + Past MI and + Peripheral Vascular Disease   Rhythm:Regular Rate:Normal     Neuro/Psych negative neurological ROS  negative psych ROS   GI/Hepatic Neg liver ROS, hiatal hernia,GERD  Medicated,,  Endo/Other  diabetes, Type 2Hypothyroidism    Renal/GU Renal disease  negative genitourinary   Musculoskeletal  (+) Arthritis , Osteoarthritis,    Abdominal Normal abdominal exam  (+)   Peds  Hematology  (+) Blood dyscrasia, anemia Lab Results      Component                Value               Date                      WBC                      18.8 (H)            04/03/2024                HGB                      12.5                04/03/2024                HCT                      38.6                04/03/2024                MCV                      92.8                04/03/2024                PLT                      263                 04/03/2024              Anesthesia Other Findings   Reproductive/Obstetrics                              Anesthesia Physical Anesthesia Plan  ASA: 3  Anesthesia Plan: MAC   Post-op Pain Management:    Induction: Intravenous  PONV Risk Score and Plan: 2 and Treatment may vary due to age or medical condition and Propofol  infusion  Airway Management Planned: Simple Face Mask and Nasal Cannula  Additional Equipment: None  Intra-op Plan:   Post-operative Plan:   Informed Consent: I have reviewed the patients History and Physical,  chart, labs and discussed the  procedure including the risks, benefits and alternatives for the proposed anesthesia with the patient or authorized representative who has indicated his/her understanding and acceptance.     Dental advisory given  Plan Discussed with: CRNA  Anesthesia Plan Comments:         Anesthesia Quick Evaluation

## 2024-04-03 NOTE — Transfer of Care (Signed)
 Immediate Anesthesia Transfer of Care Note  Patient: Audrey [name]  Procedure(s) Performed: QUILLIAN EXPOSURE (Left) FASCIOTOMY, 4 compartment right lower leg (Right) ANGIOPLASTY, USING PATCH GRAFT, LEFT COMMON FEMORAL ARTERY (Left: Groin) ENDARTERECTOMY, FEMORAL (Left: Groin) ANGIOGRAM, LOWER EXTREMITY  Patient Location: PACU  Anesthesia Type:General  Level of Consciousness: awake, drowsy, and patient cooperative  Airway & Oxygen Therapy: Patient Spontanous Breathing and Patient connected to face mask oxygen  Post-op Assessment: Report given to RN and Post -op Vital signs reviewed and stable  Post vital signs: Reviewed and stable  Last Vitals:  Vitals Value Taken Time  BP 128/63 04/03/24 16:15  Temp    Pulse 80 04/03/24 16:18  Resp 25 04/03/24 16:18  SpO2 98 % 04/03/24 16:18  Vitals shown include unfiled device data.  Last Pain:  Vitals:   04/03/24 1230  TempSrc:   PainSc: 8          Complications: No notable events documented.

## 2024-04-03 NOTE — Op Note (Signed)
 Patient name: Audrey [name] MRN: [id number] DOB: [birth year] Sex: female  04/03/2024 Pre-operative Diagnosis: rightlegcompartment syndrome, ischemicleft elg Post-operative diagnosis:  Same Surgeon:  [name] Assistants:  Lucie Apt, PA Procedure:   #1: 4 compartment fasciotomy, right leg   #2: Left iliofemoral endarterectomy with bovine pericardial patch angioplasty   #3: Left leg angiogram   #4: Placement of first 38 cm Kerecis skin substitute Anesthesia:  General Blood Loss:  250 Specimens:  none  Findings: Viable muscle in the compartments of the right leg with mild bulging of the compartment soft open.  The fascia was left open and the skin was closed with staples.  Soft plaque noted in the left common femoral and superficial femoral artery.  Endarterectomy was performed followed by bovine pericardial patch angioplasty.  I did not have good signals at the end of the case and she had an angiogram which showed severe chronic disease  Indications: Earlier today the patient underwent a follow-up lysis study and had angioplasty of her peroneal and superficial femoral artery.  She did have issues with bleeding from the Mynx closure site of the left groin which resolved with manual pressure.  When she arrived in the PACU, she began complaining of severe right leg pain.  She is very tender to touch in the calf and I suspected compartment syndrome.  In addition, I was unable to get Doppler signals in the left leg.  Therefore I discussed going back to the operating room emergently for fasciotomy of the right leg and exploration of the left groin  Procedure:  The patient was identified in the holding area and taken to [facility] OR ROOM 16  The patient was then placed supine on the table. general anesthesia was administered.  The patient was prepped and draped in the usual sterile fashion.  A time out was called and antibiotics were administered.  An experienced assistant was necessary to expedite  the procedure and assist with technical details.  She helped with exposure by providing suction and retraction.  She helped with the anastomosis by following the suture.  She helped with wound closure.  Medial and lateral incisions were made on the right leg with a 10 blade.  Cautery was used to divide subcutaneous tissue down to the fascia.  I initially x-rayed the superficial posterior compartment and there was mild bulging of the muscle.  The fascial incision was extended proximally and distally with Metzenbaums scissors I then opened the deep compartment.  All muscle was viable.  On the lateral side the anterior and lateral compartments were opened proximally and distally with Metzenbaums scissors.  All muscle was viable.  These wounds were then packed and then later closed by reapproximating the skin with staples.  Attention was turned towards the left groin.  An oblique incision was made just above the inguinal crease.  Cautery was used divide subcutaneous tissue down to the femoral sheath.  A large hematoma was evacuated from the medial thigh.  The arteriotomy site was hemostatic.  The vessel was heavily calcified circumferentially.  I elected to expose the profunda and superficial femoral artery as well as the femoral artery up under the inguinal ligament.  The patient was then fully heparinized.  After the heparin  circulated, the vessels were occluded with vascular clamps and a #11 blade was used to make an arteriotomy which was extended longitudinally with Potts scissors.  The patient had heavily calcified plaque throughout it was nearly occlusive.  I performed endarterectomy and established excellent  inflow.  I got a good distal endpoint and tacked this down with 7-0 Prolene.  Bovine pericardial patch angioplasty was then performed.  Prior to completion the appropriate flushing maneuvers were performed and the anastomosis was completed.  At this point, I did not have good signals in the ankle and so I  evaluated the superficial femoral artery with Doppler and I did not have a good signal distal to the patch.  I then reoccluded the femoral artery and opened the superficial femoral artery distal to the patch.  This was heavily calcified and nearly occlusive.  A limited endarterectomy was performed.  I then closed this longitudinally with a 6-0 Prolene.  I again did not have signals that were made at the ankle and so I stopped the patch with a micropuncture set up and perform angiography.  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  There is also severe tibial disease.  I did not see anything that was acute.  It was at this time also that the patient began having ST depression.  I felt that it was best to stop at this time and reevaluate her in the ICU.  I gave 25 mg of protamine  to reverse the heparin .  I closed the fasciotomy sites with staples.  I reapproximated the femoral sheath with 2-0 Vicryl.  The subcutaneous tissue was closed with 3-0 Vicryl and the skin was closed with staples.  I placed 38 cm of Kerecis skin substitute within the wound.  She was successfully extubated and taken recovery stable condition.   Disposition: To PACU stable.   ALONSO [name], M.D., [facility] [place] Office: [phone] Pager:  [phone]

## 2024-04-03 NOTE — Consult Note (Signed)
 NAME:  Audrey [name], MRN:  [id number], DOB:  [birth year], LOS: 1 ADMISSION DATE:  04/02/2024, CONSULTATION DATE:  04/03/24 REFERRING MD:  Dr. Serene, CHIEF COMPLAINT:  post op   History of Present Illness:  76 yoF with PMH as below significant for former smoker, DMT2, GERD, HTN, HLD, hypothyroidism, CAD/ MI, adrenal insufficiency, metastatic renal cell carcinoma (adrenal, bone, pulmonary s/p chemo, left nephrectomy and left adrenal resection 2018 ) currently in remission, and PAD on ASA/plavix .  Prior R SFA stenting 7/24 subsequently thrombosed and underwent atherectomy and stenting of right SFA, popliteal and TP trunk 12/2023.     Admitted to vascular surgery 12/11 for critical limb ischemia on heparin  gtt after presenting to office with 4 day hx of sudden onset of right foot pain and coldness.  Taken to OR 12/12 for angiogram found to have thrombosis of RLE arterial stenting and underwent right peroneal, TP trunk, and popliteal artery angioplasty with EKOS.  Went to OR 12/13 for lysis check, underwent angioplasty to peroneal artery and stent placement of right superficial femoral artery.  Post op developed hematoma in left grown with subsequent loss of doppler signals and left groin and right calf pain with concern for developing compartment syndrome requiring return to OR.  Underwent RLE fasciotomy and left femoral endarterectomy also noted to have EKG- STD changes.  PCCM consulted for post procedural ICU medical management.     Pertinent  Medical History   Past Medical History:  Diagnosis Date   Anemia    Arthritis    lower back, hips, hands   Biliary stricture (HCC)    Diabetes mellitus (HCC)    type 2    Early cataracts, bilateral    Md just watching   Elevated liver enzymes    Family history of adverse reaction to anesthesia    Daughter hard to wake up   Gallstones    GERD (gastroesophageal reflux disease)    occasional - diet controlled   History of blood transfusion 2018    History of hiatal hernia    HTN (hypertension)    Hyperlipidemia    Hypothyroidism    left renal ca dx'd 2018   renal cancer - left kidney removed, pill chemo x 1 yr   Myocardial infarction (HCC) 1991   no deficits   Peripheral vascular disease    PONV (postoperative nausea and vomiting)    SVD (spontaneous vaginal delivery)    x 3   Wears glasses    Significant Hospital Events: Including procedures, antibiotic start and stop dates in addition to other pertinent events   12/12 Admit Vascular with right ischemic foot, heparin > OR > right peroneal, TP trunk, and popliteal artery angioplasty with EKOS  Interim History / Subjective:  Reportedly left popliteal pulse only after OR Pt currently denies any chest discomfort or SOB.  C/o of urinary fullness/ pain and bilateral leg pain  Objective    Blood pressure (!) 113/50, pulse 75, temperature 98.9 F (37.2 C), resp. rate 19, height 4' 9.99 (1.473 m), weight 54.4 kg, SpO2 98%.        Intake/Output Summary (Last 24 hours) at 04/03/2024 1433 Last data filed at 04/03/2024 1404 Gross per 24 hour  Intake 2335.58 ml  Output 1025 ml  Net 1310.58 ml   Filed Weights   04/02/24 0852 04/03/24 0812  Weight: 54.4 kg 54.4 kg   Examination: General: Acute on chronically ill-appearing elderly female, lying on the bed.  Shivering HEENT: [place]/AT, eyes anicteric.  moist  mucus membranes Neuro: Alert, awake following commands Chest: Coarse breath sounds, no wheezes or rhonchi Heart: Regular rate and rhythm, no murmurs or gallops Abdomen: Soft, nontender, nondistended, bowel sounds present Extremities: Left lower extremity is cold to touch, right lower extremity tender to touch  Labs and images reviewed  Patient Lines/Drains/Airways Status     Active Line/Drains/Airways     Name Placement date Placement time Site Days   Implanted Port 01/30/18 01/30/18  0821  --  2255   Peripheral IV 04/02/24 22 G Anterior;Distal;Left Forearm 04/02/24   0847  Forearm  1   Closed System Drain --  --  --  --   Urethral Catheter Tad Barrio, RN Latex;Straight-tip 14 Fr. 04/03/24  --  Latex;Straight-tip  less than 1   External Urinary Catheter 04/02/24  1400  --  1   Wound 04/02/24 1400 Irritant Contact Dermatitis Labia Right;Left;Lateral 04/02/24  1400  Labia  1   Wound 04/03/24 1034 Surgical Closed Surgical Incision Groin Left 04/03/24  1034  Groin  less than 1   Wound 04/03/24 1603 Surgical Closed Surgical Incision Leg Left 04/03/24  1603  Leg  less than 1           Resolved problem list   Assessment and Plan  Severe PAD with acute ischemic RLE  Compartment syndrome of RLE s/p fasciotomy Left groin hematoma status post endarterectomy Prior R SFA stenting 7/24; s/p atherectomy and stenting of right SFA, popliteal and TP trunk 12/2023.   Restart aspirin  and Plavix  On 12/12 angiogram w/ thrombosis of RLE arterial stenting and underwent right peroneal, TP trunk, and popliteal artery angioplasty with EKOS.  Lysis check 12/13 s/p angioplasty to peroneal artery and stent placement of right superficial femoral artery c/b left groin hematoma and developing RLE compartment syndrome s/p RLE fasciotomy and left femoral endarterectomy Vascular surgery is following Doppler pulses in lower extremities per protocol  ST depression lateral leads likely reperfusion changes Patient with history of coronary artery disease Trend EKGs and troponin Will get echocardiogram Continue telemetry monitoring  Gross hematuria  R/o UTI- hx of recurrent Question she may have developed rhabdomyolysis with ischemic leg Will send CK Started on empiric ceftriaxone  Follow-up urine culture and adjust antibiotic accordingly Monitor H&H Continue IV fluid  HTN HLD Hold antihypertensive meds for now  Chronic adrenal insufficiency  Resume hydrocortisone   Poorly controlled diabetes with hyperglycemia A1c 10.3 hold repaglinide   Continue sliding scale insulin   with CBG goal 140-180  Hypothyroidism Continue levothyroxine  75 mcg   Labs   CBC: Recent Labs  Lab 04/02/24 0852 04/02/24 0857 04/02/24 1822 04/03/24 0315  WBC  --   --  14.6* 18.8*  HGB 14.6 13.9 13.4 12.5  HCT 43.0 41.0 40.4 38.6  MCV  --   --  91.0 92.8  PLT  --   --  270 263    Basic Metabolic Panel: Recent Labs  Lab 04/02/24 0852 04/02/24 0857 04/02/24 0917  NA 138 137 136  K 6.9* 6.9* 4.3  CL 108 110 107  CO2  --   --  20*  GLUCOSE 261* 257* 278*  BUN 67* 70* 43*  CREATININE 1.80* 1.70* 1.69*  CALCIUM   --   --  9.8   GFR: Estimated Creatinine Clearance: 18.8 mL/min (A) (by C-G formula based on SCr of 1.69 mg/dL (H)). Recent Labs  Lab 04/02/24 1822 04/03/24 0315  WBC 14.6* 18.8*    Liver Function Tests: No results for input(s): AST, ALT, ALKPHOS, BILITOT, PROT, ALBUMIN   in the last 168 hours. No results for input(s): LIPASE, AMYLASE in the last 168 hours. No results for input(s): AMMONIA in the last 168 hours.  ABG    Component Value Date/Time   TCO2 25 04/02/2024 0857     Coagulation Profile: No results for input(s): INR, PROTIME in the last 168 hours.  Cardiac Enzymes: No results for input(s): CKTOTAL, CKMB, CKMBINDEX, TROPONINI in the last 168 hours.  HbA1C: Hgb A1c MFr Bld  Date/Time Value Ref Range Status  04/02/2024 06:22 PM 10.3 (H) 4.8 - 5.6 % Final    Comment:    (NOTE) Diagnosis of Diabetes The following HbA1c ranges recommended by the American Diabetes Association (ADA) may be used as an aid in the diagnosis of diabetes mellitus.  Hemoglobin             Suggested A1C NGSP%              Diagnosis  <5.7                   Non Diabetic  5.7-6.4                Pre-Diabetic  >6.4                   Diabetic  <7.0                   Glycemic control for                       adults with diabetes.    06/21/2020 08:11 AM 8.2 (H) 4.8 - 5.6 % Final    Comment:    (NOTE) Pre diabetes:           5.7%-6.4%  Diabetes:              >6.4%  Glycemic control for   <7.0% adults with diabetes     CBG: Recent Labs  Lab 04/02/24 1519 04/02/24 2121 04/03/24 0735 04/03/24 1116 04/03/24 1206  GLUCAP 178* 340* 311* 225* 203*    Review of Systems:   12 point review of system is significant for complaint mentioned HPI, rest negative  Past Medical History:  She,  has a past medical history of Anemia, Arthritis, Biliary stricture (HCC), Diabetes mellitus (HCC), Early cataracts, bilateral, Elevated liver enzymes, Family history of adverse reaction to anesthesia, Gallstones, GERD (gastroesophageal reflux disease), History of blood transfusion (2018), History of hiatal hernia, HTN (hypertension), Hyperlipidemia, Hypothyroidism, left renal ca (dx'd 2018), Myocardial infarction (HCC) (1991), Peripheral vascular disease, PONV (postoperative nausea and vomiting), SVD (spontaneous vaginal delivery), and Wears glasses.   Surgical History:   Past Surgical History:  Procedure Laterality Date   ABDOMINAL AORTOGRAM N/A 12/29/2023   Procedure: ABDOMINAL AORTOGRAM;  Surgeon: Sheree Penne Bruckner, MD;  Location: [facility] INVASIVE CV LAB;  Service: Cardiovascular;  Laterality: N/A;   ABDOMINAL AORTOGRAM W/LOWER EXTREMITY N/A 10/21/2022   Procedure: ABDOMINAL AORTOGRAM W/LOWER EXTREMITY;  Surgeon: Sheree Penne Bruckner, MD;  Location: [facility] INVASIVE CV LAB;  Service: Cardiovascular;  Laterality: N/A;   BALLOON DILATION N/A 07/23/2018   Procedure: BALLOON DILATION;  Surgeon: Wilhelmenia Aloha Raddle., MD;  Location: [facility] ENDOSCOPY;  Service: Gastroenterology;  Laterality: N/A;   BILIARY BRUSHING  08/10/2018   Procedure: BILIARY BRUSHING;  Surgeon: Wilhelmenia Aloha Raddle., MD;  Location: [facility] ENDOSCOPY;  Service: Gastroenterology;;   BILIARY BRUSHING  11/30/2018   Procedure: BILIARY BRUSHING;  Surgeon: Wilhelmenia Aloha Raddle., MD;  Location: [facility] ENDOSCOPY;  Service: Gastroenterology;;   BILIARY DILATION  08/10/2018    Procedure: BILIARY DILATION;  Surgeon: Wilhelmenia Aloha Raddle., MD;  Location: [facility] ENDOSCOPY;  Service: Gastroenterology;;   BILIARY DILATION  08/27/2018   Procedure: BILIARY DILATION;  Surgeon: Wilhelmenia Aloha Raddle., MD;  Location: [facility] ENDOSCOPY;  Service: Gastroenterology;;   BILIARY DILATION  01/24/2020   Procedure: BILIARY DILATION;  Surgeon: Wilhelmenia Aloha Raddle., MD;  Location: [facility] ENDOSCOPY;  Service: Gastroenterology;;   BILIARY STENT PLACEMENT  08/10/2018   Procedure: BILIARY STENT PLACEMENT;  Surgeon: Wilhelmenia Aloha Raddle., MD;  Location: [facility] ENDOSCOPY;  Service: Gastroenterology;;   BILIARY STENT PLACEMENT  08/27/2018   Procedure: BILIARY STENT PLACEMENT;  Surgeon: Wilhelmenia Aloha Raddle., MD;  Location: [facility] ENDOSCOPY;  Service: Gastroenterology;;   BILIARY STENT PLACEMENT  11/30/2018   Procedure: BILIARY STENT PLACEMENT;  Surgeon: Wilhelmenia Aloha Raddle., MD;  Location: [facility] ENDOSCOPY;  Service: Gastroenterology;;   BIOPSY  07/23/2018   Procedure: BIOPSY;  Surgeon: Wilhelmenia Aloha Raddle., MD;  Location: [facility] ENDOSCOPY;  Service: Gastroenterology;;   BIOPSY  01/24/2020   Procedure: BIOPSY;  Surgeon: Wilhelmenia Aloha Raddle., MD;  Location: [facility] ENDOSCOPY;  Service: Gastroenterology;;   CHOLECYSTECTOMY N/A 09/02/2018   Procedure: LAPAROSCOPIC CHOLECYSTECTOMY;  Surgeon: Aron Shoulders, MD;  Location: [facility] OR;  Service: General;  Laterality: N/A;   COLONOSCOPY     normal    ENDOSCOPIC RETROGRADE CHOLANGIOPANCREATOGRAPHY (ERCP) WITH PROPOFOL  N/A 08/10/2018   Procedure: ENDOSCOPIC RETROGRADE CHOLANGIOPANCREATOGRAPHY (ERCP) WITH PROPOFOL ;  Surgeon: Wilhelmenia Aloha Raddle., MD;  Location: [facility] ENDOSCOPY;  Service: Gastroenterology;  Laterality: N/A;   ENDOSCOPIC RETROGRADE CHOLANGIOPANCREATOGRAPHY (ERCP) WITH PROPOFOL  N/A 11/30/2018   Procedure: ENDOSCOPIC RETROGRADE CHOLANGIOPANCREATOGRAPHY (ERCP) WITH PROPOFOL ;  Surgeon: Wilhelmenia Aloha Raddle., MD;  Location: [facility] ENDOSCOPY;  Service: Gastroenterology;   Laterality: N/A;   ENDOSCOPIC RETROGRADE CHOLANGIOPANCREATOGRAPHY (ERCP) WITH PROPOFOL  N/A 01/24/2020   Procedure: ENDOSCOPIC RETROGRADE CHOLANGIOPANCREATOGRAPHY (ERCP) WITH PROPOFOL ;  Surgeon: Wilhelmenia Aloha Raddle., MD;  Location: [facility] ENDOSCOPY;  Service: Gastroenterology;  Laterality: N/A;   ERCP N/A 07/23/2018   Procedure: ENDOSCOPIC RETROGRADE CHOLANGIOPANCREATOGRAPHY (ERCP);  Surgeon: Wilhelmenia Aloha Raddle., MD;  Location: [facility] ENDOSCOPY;  Service: Gastroenterology;  Laterality: N/A;   ERCP N/A 08/27/2018   Procedure: ENDOSCOPIC RETROGRADE CHOLANGIOPANCREATOGRAPHY (ERCP);  Surgeon: Wilhelmenia Aloha Raddle., MD;  Location: [facility] ENDOSCOPY;  Service: Gastroenterology;  Laterality: N/A;   ESOPHAGOGASTRODUODENOSCOPY N/A 01/24/2020   Procedure: ESOPHAGOGASTRODUODENOSCOPY (EGD);  Surgeon: Wilhelmenia Aloha Raddle., MD;  Location: [facility] ENDOSCOPY;  Service: Gastroenterology;  Laterality: N/A;   ESOPHAGOGASTRODUODENOSCOPY (EGD) WITH PROPOFOL  N/A 08/27/2018   Procedure: ESOPHAGOGASTRODUODENOSCOPY (EGD) WITH PROPOFOL ;  Surgeon: Wilhelmenia Aloha Raddle., MD;  Location: [facility] ENDOSCOPY;  Service: Gastroenterology;  Laterality: N/A;   EUS N/A 08/27/2018   Procedure: ESOPHAGEAL ENDOSCOPIC ULTRASOUND (EUS) RADIAL;  Surgeon: Wilhelmenia Aloha Raddle., MD;  Location: [facility] ENDOSCOPY;  Service: Gastroenterology;  Laterality: N/A;   FINE NEEDLE ASPIRATION  08/27/2018   Procedure: FINE NEEDLE ASPIRATION (FNA) LINEAR;  Surgeon: Wilhelmenia Aloha Raddle., MD;  Location: [facility] ENDOSCOPY;  Service: Gastroenterology;;   SHIRLENE HERNIA REPAIR N/A 06/23/2020   Procedure: OPEN INCISIONAL HERNIA REPAIR WITH MESH;  Surgeon: Lyndel Deward PARAS, MD;  Location: [facility] ORS;  Service: General;  Laterality: N/A;  ROOM 2 STARTING AT 11:00AM FOR 90 MIN   IR IMAGING GUIDED PORT INSERTION  01/08/2018   LAPAROSCOPIC NEPHRECTOMY Left 08/01/2016   Procedure: LAPAROSCOPIC  RADICAL NEPHRECTOMY/ REPAIR OF UMBILICAL HERNIA;  Surgeon: Gretel Ferrara, MD;  Location: [facility] ORS;   Service: Urology;  Laterality: Left;   LOWER EXTREMITY ANGIOGRAPHY N/A 12/29/2023   Procedure:  Lower Extremity Angiography;  Surgeon: Sheree Penne Bruckner, MD;  Location: [facility] INVASIVE CV LAB;  Service: Cardiovascular;  Laterality: N/A;   LOWER EXTREMITY INTERVENTION N/A 12/29/2023   Procedure: LOWER EXTREMITY INTERVENTION;  Surgeon: Sheree Penne Bruckner, MD;  Location: [facility] INVASIVE CV LAB;  Service: Cardiovascular;  Laterality: N/A;   PERIPHERAL VASCULAR INTERVENTION  10/21/2022   Procedure: PERIPHERAL VASCULAR INTERVENTION;  Surgeon: Sheree Penne Bruckner, MD;  Location: [facility] INVASIVE CV LAB;  Service: Cardiovascular;;   REMOVAL OF STONES  07/23/2018   Procedure: REMOVAL OF GALL STONES;  Surgeon: Wilhelmenia Aloha Raddle., MD;  Location: [facility] ENDOSCOPY;  Service: Gastroenterology;;   REMOVAL OF STONES  08/10/2018   Procedure: REMOVAL OF STONES;  Surgeon: Wilhelmenia Aloha Raddle., MD;  Location: [facility] ENDOSCOPY;  Service: Gastroenterology;;   REMOVAL OF STONES  08/27/2018   Procedure: REMOVAL OF STONES;  Surgeon: Wilhelmenia Aloha Raddle., MD;  Location: [facility] ENDOSCOPY;  Service: Gastroenterology;;   REMOVAL OF STONES  01/24/2020   Procedure: REMOVAL OF STONES;  Surgeon: Wilhelmenia Aloha Raddle., MD;  Location: [facility] ENDOSCOPY;  Service: Gastroenterology;;   ANNETT  07/23/2018   Procedure: ANNETT;  Surgeon: Wilhelmenia Aloha Raddle., MD;  Location: [facility] ENDOSCOPY;  Service: Gastroenterology;;   CLEDA REMOVAL  08/27/2018   Procedure: STENT REMOVAL;  Surgeon: Wilhelmenia Aloha Raddle., MD;  Location: [facility] ENDOSCOPY;  Service: Gastroenterology;;   CLEDA REMOVAL  11/30/2018   Procedure: STENT REMOVAL;  Surgeon: Wilhelmenia Aloha Raddle., MD;  Location: [facility] ENDOSCOPY;  Service: Gastroenterology;;   CLEDA REMOVAL  01/24/2020   Procedure: STENT REMOVAL;  Surgeon: Wilhelmenia Aloha Raddle., MD;  Location: [facility] ENDOSCOPY;  Service: Gastroenterology;;   UPPER GI ENDOSCOPY     x 1   VAGINAL PROLAPSE REPAIR  11/18/2019    [facility]     Social History:   reports that she quit smoking about 59 years ago. Her smoking use included cigarettes. She started smoking about 67 years ago. She has a 8 pack-year smoking history. She has never used smokeless tobacco. She reports that she does not drink alcohol  and does not use drugs.   Family History:  Her family history includes Heart attack (age of onset: 3) in her father; Heart disease (age of onset: 31) in her brother. There is no history of Colon cancer, Esophageal cancer, Inflammatory bowel disease, Liver disease, Pancreatic cancer, Rectal cancer, or Stomach cancer.   Allergies Allergies[1]   Home Medications  Prior to Admission medications  Medication Sig Start Date End Date Taking? Authorizing Provider  acyclovir  (ZOVIRAX ) 400 MG tablet TAKE 1 TABLET BY MOUTH EVERY DAY 11/06/23  Yes Onesimo Emaline Brink, MD  amLODipine  (NORVASC ) 5 MG tablet Take 5 mg by mouth daily. 09/01/17  Yes [provider]  aspirin  EC 81 MG tablet Take 1 tablet (81 mg total) by mouth daily. Swallow whole. 12/29/23 12/28/24 Yes Sheree Penne Bruckner, MD  atorvastatin  (LIPITOR) 80 MG tablet Take 80 mg by mouth at bedtime.    Yes [provider]  carvedilol  (COREG ) 6.25 MG tablet Take 6.25 mg by mouth 2 (two) times daily. 09/01/17  Yes [provider]  clobetasol  ointment (TEMOVATE ) 0.05 % Apply 1 application  topically 2 (two) times daily. lichen sclerosis   Yes [provider]  clopidogrel  (PLAVIX ) 75 MG tablet Take 75 mg by mouth at bedtime. 12/08/20  Yes [provider]  docusate sodium  (COLACE) 100 MG capsule Take 100 mg by mouth daily.   Yes [provider]  hydrocortisone  (CORTEF ) 10 MG tablet Take 5-10  mg by mouth See admin instructions. Take 1 tablet (10 mg) by mouth in the morning & 1/2 tablet (5 mg) by mouth in the evening.   Yes [provider]  ipratropium (ATROVENT  HFA) 17 MCG/ACT inhaler Inhale 2 puffs into the lungs every  6 (six) hours as needed for wheezing.   Yes [provider]  levothyroxine  (SYNTHROID ) 75 MCG tablet Take 75 mcg by mouth daily before breakfast.    Yes [provider]  lidocaine  (LIDODERM ) 5 % Place 1 patch onto the skin daily as needed. Remove & Discard patch within 12 hours or as directed by MD Patient taking differently: Place 1 patch onto the skin daily. Remove & Discard patch within 12 hours or as directed by MD 09/23/23  Yes Onesimo Emaline Brink, MD  lidocaine  (LMX) 4 % cream Apply 1 Application topically daily as needed (pain). 10/09/22  Yes [provider]  lidocaine -prilocaine  (EMLA ) cream Apply 1 Application topically every 3 (three) months. Prior to port access   Yes [provider]  mupirocin  ointment (BACTROBAN ) 2 % Apply topically 2 (two) times daily. Patient taking differently: Apply 1 Application topically daily. 01/29/23  Yes Onesimo Emaline Brink, MD  pantoprazole  (PROTONIX ) 20 MG tablet Take 1 tablet (20 mg total) by mouth daily. Patient taking differently: Take 20 mg by mouth daily as needed (indigestion/heartburn.). 02/11/24 09/08/24 Yes Bethanie Cough, PA-C  repaglinide  (PRANDIN ) 0.5 MG tablet Take 0.5 mg by mouth 2 (two) times daily. 12/01/20  Yes [provider]  SSD 1 % cream Apply 1 Application topically daily as needed (Foot infection). 07/07/20  Yes [provider]  traMADol  (ULTRAM ) 50 MG tablet Take 1 tablet (50 mg total) by mouth every 6 (six) hours as needed. Patient taking differently: Take 50 mg by mouth 4 (four) times daily as needed (pain.). 09/30/23  Yes Onesimo Emaline Brink, MD     Critical care time:     The patient is critically ill due to acute right lower extremity ischemia status post fasciotomies, required close monitoring.  Left groin hematoma status post endarterectomy.  Critical care was necessary to treat or prevent imminent or life-threatening deterioration.  Critical care was time spent personally by  me on the following activities: development of treatment plan with patient and/or surrogate as well as nursing, discussions with consultants, evaluation of patient's response to treatment, examination of patient, obtaining history from patient or surrogate, ordering and performing treatments and interventions, ordering and review of laboratory studies, ordering and review of radiographic studies, pulse oximetry, re-evaluation of patient's condition and participation in multidisciplinary rounds.   During this encounter critical care time was devoted to patient care services described in this note for 45 minutes.    Valinda Novas, MD [facility] See Amion for pager If no response to pager, please call [phone] until 7pm After 7pm, Please call E-link [phone]           [1]  Allergies Allergen Reactions   Doxycycline Nausea And Vomiting   Adhesive [Tape] Other (See Comments)    Tears skin off Paper tape is ok per patient   Cymbalta [Duloxetine Hcl]    Duloxetine Other (See Comments)    Feel woozy   Gabapentin  (Once-Daily) Other (See Comments)    Feels loopy   Nsaids Other (See Comments)    Pt only has one kidney    Statins Other (See Comments)    Liver/kidney issues.

## 2024-04-04 ENCOUNTER — Inpatient Hospital Stay (HOSPITAL_COMMUNITY)

## 2024-04-04 DIAGNOSIS — R9431 Abnormal electrocardiogram [ECG] [EKG]: Secondary | ICD-10-CM

## 2024-04-04 DIAGNOSIS — D649 Anemia, unspecified: Secondary | ICD-10-CM

## 2024-04-04 DIAGNOSIS — N3001 Acute cystitis with hematuria: Secondary | ICD-10-CM

## 2024-04-04 DIAGNOSIS — I998 Other disorder of circulatory system: Secondary | ICD-10-CM

## 2024-04-04 DIAGNOSIS — I739 Peripheral vascular disease, unspecified: Secondary | ICD-10-CM

## 2024-04-04 LAB — BASIC METABOLIC PANEL WITH GFR
Anion gap: 10 (ref 5–15)
BUN: 30 mg/dL — ABNORMAL HIGH (ref 8–23)
CO2: 19 mmol/L — ABNORMAL LOW (ref 22–32)
Calcium: 7.4 mg/dL — ABNORMAL LOW (ref 8.9–10.3)
Chloride: 108 mmol/L (ref 98–111)
Creatinine, Ser: 1.68 mg/dL — ABNORMAL HIGH (ref 0.44–1.00)
GFR, Estimated: 30 mL/min — ABNORMAL LOW (ref >60)
Glucose, Bld: 233 mg/dL — ABNORMAL HIGH (ref 70–99)
Potassium: 4.3 mmol/L (ref 3.5–5.1)
Sodium: 137 mmol/L (ref 135–145)

## 2024-04-04 LAB — CK: Total CK: 2214 U/L — ABNORMAL HIGH (ref 38–234)

## 2024-04-04 LAB — PREPARE RBC (CROSSMATCH)

## 2024-04-04 LAB — CBC
HCT: 23.9 % — ABNORMAL LOW (ref 36.0–46.0)
Hemoglobin: 7.5 g/dL — ABNORMAL LOW (ref 12.0–15.0)
MCH: 29.9 pg (ref 26.0–34.0)
MCHC: 31.4 g/dL (ref 30.0–36.0)
MCV: 95.2 fL (ref 80.0–100.0)
Platelets: 152 K/uL (ref 150–400)
RBC: 2.51 MIL/uL — ABNORMAL LOW (ref 3.87–5.11)
RDW: 14.7 % (ref 11.5–15.5)
WBC: 9.2 K/uL (ref 4.0–10.5)
nRBC: 0 % (ref 0.0–0.2)

## 2024-04-04 LAB — URINE CULTURE: Culture: NO GROWTH

## 2024-04-04 LAB — GLUCOSE, CAPILLARY
Glucose-Capillary: 229 mg/dL — ABNORMAL HIGH (ref 70–99)
Glucose-Capillary: 178 mg/dL — ABNORMAL HIGH (ref 70–99)
Glucose-Capillary: 218 mg/dL — ABNORMAL HIGH (ref 70–99)
Glucose-Capillary: 292 mg/dL — ABNORMAL HIGH (ref 70–99)
Glucose-Capillary: 338 mg/dL — ABNORMAL HIGH (ref 70–99)
Glucose-Capillary: 181 mg/dL — ABNORMAL HIGH (ref 70–99)

## 2024-04-04 LAB — ECHOCARDIOGRAM COMPLETE
Area-P 1/2: 3.17 cm2
Calc EF: 52.4 %
Height: 57.992 in
MV M vel: 4.76 m/s
MV Peak grad: 90.6 mmHg
S' Lateral: 3.2 cm
Single Plane A2C EF: 56.8 %
Single Plane A4C EF: 48.5 %
Weight: 1920.01 [oz_av]

## 2024-04-04 LAB — HEMOGLOBIN AND HEMATOCRIT, BLOOD
HCT: 26.4 % — ABNORMAL LOW (ref 36.0–46.0)
Hemoglobin: 9 g/dL — ABNORMAL LOW (ref 12.0–15.0)

## 2024-04-04 LAB — TROPONIN I (HIGH SENSITIVITY): Troponin I (High Sensitivity): 38 ng/L — ABNORMAL HIGH (ref <18)

## 2024-04-04 MED ORDER — MELATONIN 3 MG PO TABS
3.0000 mg | ORAL_TABLET | Freq: Every day | ORAL | Status: DC
  Administered 2024-04-04 – 2024-04-05 (×2): 3 mg via ORAL
  Filled 2024-04-04 (×2): qty 1

## 2024-04-04 MED ORDER — HEPARIN SODIUM (PORCINE) 5000 UNIT/ML IJ SOLN
5000.0000 [IU] | Freq: Three times a day (TID) | INTRAMUSCULAR | Status: DC
  Administered 2024-04-04 – 2024-04-06 (×7): 5000 [IU] via SUBCUTANEOUS
  Filled 2024-04-04 (×7): qty 1

## 2024-04-04 MED ORDER — GABAPENTIN 100 MG PO CAPS
100.0000 mg | ORAL_CAPSULE | Freq: Once | ORAL | Status: AC | PRN
  Administered 2024-04-04: 100 mg via ORAL
  Filled 2024-04-04: qty 1

## 2024-04-04 MED ORDER — HYDROMORPHONE HCL 1 MG/ML IJ SOLN
2.0000 mg | INTRAMUSCULAR | Status: DC | PRN
  Administered 2024-04-05 – 2024-04-06 (×3): 2 mg via INTRAVENOUS
  Filled 2024-04-04 (×4): qty 2

## 2024-04-04 MED ORDER — INSULIN GLARGINE-YFGN 100 UNIT/ML ~~LOC~~ SOLN: 5.0000 [IU] | Freq: Two times a day (BID) | SUBCUTANEOUS | Status: DC

## 2024-04-04 MED ORDER — INSULIN GLARGINE 100 UNIT/ML ~~LOC~~ SOLN
5.0000 [IU] | Freq: Two times a day (BID) | SUBCUTANEOUS | Status: DC
  Administered 2024-04-04 – 2024-04-06 (×5): 5 [IU] via SUBCUTANEOUS
  Filled 2024-04-04 (×7): qty 0.05

## 2024-04-04 MED ORDER — OXYCODONE HCL 5 MG PO TABS
10.0000 mg | ORAL_TABLET | ORAL | Status: DC | PRN
  Administered 2024-04-04 – 2024-04-05 (×3): 10 mg via ORAL
  Filled 2024-04-04 (×3): qty 2

## 2024-04-04 MED ORDER — SODIUM CHLORIDE 0.9% IV SOLUTION: Freq: Once | INTRAVENOUS | Status: AC

## 2024-04-04 NOTE — Progress Notes (Signed)
 NAME:  Audrey [name], MRN:  [id number], DOB:  December 20, 1940, LOS: 2 ADMISSION DATE:  04/02/2024, CONSULTATION DATE:  04/03/24 REFERRING MD:  Dr. Serene, CHIEF COMPLAINT:  post op   History of Present Illness:  45 yoF with PMH as below significant for former smoker, DMT2, GERD, HTN, HLD, hypothyroidism, CAD/ MI, adrenal insufficiency, metastatic renal cell carcinoma (adrenal, bone, pulmonary s/p chemo, left nephrectomy and left adrenal resection 2018 ) currently in remission, and PAD on ASA/plavix .  Prior R SFA stenting 7/24 subsequently thrombosed and underwent atherectomy and stenting of right SFA, popliteal and TP trunk 12/2023.     Admitted to vascular surgery 12/11 for critical limb ischemia on heparin  gtt after presenting to office with 4 day hx of sudden onset of right foot pain and coldness.  Taken to OR 12/12 for angiogram found to have thrombosis of RLE arterial stenting and underwent right peroneal, TP trunk, and popliteal artery angioplasty with EKOS.  Went to OR 12/13 for lysis check, underwent angioplasty to peroneal artery and stent placement of right superficial femoral artery.  Post op developed hematoma in left grown with subsequent loss of doppler signals and left groin and right calf pain with concern for developing compartment syndrome requiring return to OR.  Underwent RLE fasciotomy and left femoral endarterectomy also noted to have EKG- STD changes.  PCCM consulted for post procedural ICU medical management.     Pertinent  Medical History   Past Medical History:  Diagnosis Date   Anemia    Arthritis    lower back, hips, hands   Biliary stricture (HCC)    Diabetes mellitus (HCC)    type 2    Early cataracts, bilateral    Md just watching   Elevated liver enzymes    Family history of adverse reaction to anesthesia    Daughter hard to wake up   Gallstones    GERD (gastroesophageal reflux disease)    occasional - diet controlled   History of blood transfusion 2018    History of hiatal hernia    HTN (hypertension)    Hyperlipidemia    Hypothyroidism    left renal ca dx'd 2018   renal cancer - left kidney removed, pill chemo x 1 yr   Myocardial infarction (HCC) 1991   no deficits   Peripheral vascular disease    PONV (postoperative nausea and vomiting)    SVD (spontaneous vaginal delivery)    x 3   Wears glasses    Significant Hospital Events: Including procedures, antibiotic start and stop dates in addition to other pertinent events   12/12 Admit Vascular with right ischemic foot, heparin > OR > right peroneal, TP trunk, and popliteal artery angioplasty with EKOS  Interim History / Subjective:  Some incisional pain. Tolerating diet. Standing up hurt, but she was able to get up to the chair.   Objective    Blood pressure (!) 116/40, pulse 75, temperature (!) 97 F (36.1 C), temperature source Axillary, resp. rate 19, height 4' 9.99 (1.473 m), weight 54.4 kg, SpO2 96%.        Intake/Output Summary (Last 24 hours) at 04/04/2024 0950 Last data filed at 04/04/2024 0700 Gross per 24 hour  Intake 3008.81 ml  Output 1075 ml  Net 1933.81 ml   Filed Weights   04/02/24 0852 04/03/24 0812  Weight: 54.4 kg 54.4 kg   Examination: General: chronically ill appearing woman sitting up in the chair in NAD HEENT: [place]/AT, eyes anicteric Neuro: awake, alert, moving all  extremities Chest: breathing comfortably on RA, CTAB Heart: S1S2, RRR Abdomen: soft, NT Extremities: bruising but not bleeding or hematoma at left femoral groin incision. RLE incisions without bleeding or drainage. TTP both shins.  GU: foely with amber urine-- no blood or clots today  BUN 30 Cr 1.68 WBC 9.2 H/H 7.5/23.9 Platelets 152  Patient Lines/Drains/Airways Status     Active Line/Drains/Airways     Name Placement date Placement time Site Days   Implanted Port 01/30/18 01/30/18  9178  --  2256   Closed System Drain --  --  --  --   Urethral Catheter Tad Barrio, RN  Latex;Straight-tip 14 Fr. 04/03/24  --  Latex;Straight-tip  1   Wound 04/02/24 1400 Irritant Contact Dermatitis Labia Right;Left;Lateral 04/02/24  1400  Labia  2   Wound 04/03/24 1034 Surgical Closed Surgical Incision Groin Left 04/03/24  1034  Groin  1   Wound 04/03/24 1603 Surgical Closed Surgical Incision Leg Left 04/03/24  1603  Leg  1           Resolved problem list   Assessment and Plan  Severe PAD with acute ischemic RLE  Compartment syndrome of RLE s/p fasciotomy Left groin hematoma status post endarterectomy Prior R SFA stenting 7/24; s/p atherectomy and stenting of right SFA, popliteal and TP trunk 12/2023.   Restart aspirin  and Plavix  On 12/12 angiogram w/ thrombosis of RLE arterial stenting and underwent right peroneal, TP trunk, and popliteal artery angioplasty with EKOS.  Lysis check 12/13 s/p angioplasty to peroneal artery and stent placement of right superficial femoral artery c/b left groin hematoma and developing RLE compartment syndrome s/p RLE fasciotomy and left femoral endarterectomy -neurovascular checks -progress diet & mobility post-op -statin, aspirin ; may be a good candidate long term for DAPT. Will discuss with VVS.   Concern for mild rhabdo with compartment syndrome -recheck CK; con't IVF- NS 50cc/h  ST depression lateral leads likely reperfusion changes;  history of coronary artery. No CP.  -repeat trop & EKG this morning -echo pending -tele monitoring  Gross hematuria due to UTI ; hematuria resolved. Not CAUTI since catheter was placed for surgery the day the specimen was collected Has h/o recurrent UTIs -con't ceftriaxone  -urine culture pending -con't foley today, hopefully remove as mobility improves -IVF  Anemia -transfuse 1 unit pRBC with concern for EKG changes and possible ischemic heart disease  HTN -hold PTA coreg  and amlodipine  until BP rises  HLD -statin  Chronic adrenal insufficiency  -hydrocortisone  10 & 5mg    Poorly  controlled diabetes with hyperglycemia, A1c 10.3 Uncontrolled hyperglycemia from extra steroids yesterday. -con't to hold repaglinide   -starting glargine 5 units BID -SSI PRN -goal BG <180  Hypothyroidism -synthroid    Labs   CBC: Recent Labs  Lab 04/02/24 1822 04/03/24 0315 04/03/24 0640 04/03/24 1423 04/04/24 0433  WBC 14.6* 18.8* 12.1*  --  9.2  HGB 13.4 12.5 8.6* 8.8* 7.5*  HCT 40.4 38.6 26.3* 26.0* 23.9*  MCV 91.0 92.8 92.9  --  95.2  PLT 270 263 192  --  152    Basic Metabolic Panel: Recent Labs  Lab 04/02/24 0852 04/02/24 0857 04/02/24 0917 04/03/24 1423 04/03/24 1700 04/04/24 0433  NA 138 137 136 138 137 137  K 6.9* 6.9* 4.3 4.7 4.8 4.3  CL 108 110 107  --  108 108  CO2  --   --  20*  --  19* 19*  GLUCOSE 261* 257* 278*  --  279* 233*  BUN 67* 70*  43*  --  31* 30*  CREATININE 1.80* 1.70* 1.69*  --  1.75* 1.68*  CALCIUM   --   --  9.8  --  7.8* 7.4*   GFR: Estimated Creatinine Clearance: 18.9 mL/min (A) (by C-G formula based on SCr of 1.68 mg/dL (H)). Recent Labs  Lab 04/02/24 1822 04/03/24 0315 04/03/24 0640 04/04/24 0433  WBC 14.6* 18.8* 12.1* 9.2      Critical care time:      Leita SHAUNNA Gaskins, DO 04/04/2024 10:16 AM [facility]  For contact information, see Amion. If no response to pager, please call PCCM consult pager. After hours, 7PM- 7AM, please call Elink.

## 2024-04-04 NOTE — Progress Notes (Signed)
 Inpatient Rehab Admissions:  Inpatient Rehab Consult received.  I met with patient at the bedside for rehabilitation assessment and to discuss goals and expectations of an inpatient rehab admission.  Discussed average length of stay and discharge home after completion of CIR. Pt acknowledged understanding and is interested in pursuing CIR. Pt would like some time to discuss support with family and friends. Will continue to follow.  Signed: Tinnie Yvone Cohens, MS, CCC-SLP Admissions Coordinator [phone]

## 2024-04-04 NOTE — Evaluation (Addendum)
 Physical Therapy Evaluation Patient Details Name: Audrey [name] MRN: [id number] DOB: [birth year] Today's Date: 04/04/2024  History of Present Illness  Pt is an 83 y.o. female who presented 04/01/24 for aortogram, R leg angiogram with second and third order cannulation, R peroneal, TP trunk, popliteal artery angioplasty, and initiation of catheter directed thrombolysis secondary to thrombosis of R leg artery stenting. S/p follow-up lysis with R leg angiogram, angioplasty R peroneal artery and tibioperoneal trunk, R superficial femoral artery stent 12/13, after procedure pt developed concerns for compartment syndrome. S/p R leg 4 compartment fasciotomy, L iliofemoral endarterectomy with bovine pericardial patch angioplasty, and L leg angiogram 12/13. PMH: DM2, HTN, HLD, Hypothyroidism, MI, nephrectomy for renal cell cancer 2018 with metastasis to her bone, anemia, arthritis, biliary stricture, PVD   Clinical Impression  Pt presents with condition above and deficits mentioned below, see PT Problem List. PTA, she was independent without DME in her home but used a WC when going to the store. She was living alone in a 2-level house (only uses the main level of the house) with x1 short STE. She is unsure if 24/7 care could be arranged for her at d/c or not. At baseline, pt has bil peripheral neuropathy impacting sensation in her feet, but reports her sensation in her R leg inferior to her knee is diminished more than usual at this time. She is currently limited by R>L leg pain, impacting her R ankle dorsiflexion ROM and strength. She is also opting to mobilize NWB on her R leg due to the pain, but MD reports pt is permitted to [facility] on her R leg as able. She is currently needing minA to transition supine to sit, modA to transfer to stand, and maxA to hop/slide L foot to transfer OOB to the recliner with RW support. She tends to flex her trunk and hips and lean posteriorly when standing. She also tends to sit  prematurely, thereby needing maxA to safely reach the chair. She is at high risk for falls. She has had a drastic functional decline and could greatly benefit from intensive inpatient rehab, > 3 hours/day, if she can qualify for it. Will continue to follow acutely. Requested RN to order R ankle PRAFO.      If plan is discharge home, recommend the following: Two people to help with walking and/or transfers;Two people to help with bathing/dressing/bathroom;Assistance with cooking/housework;Assist for transportation;Help with stairs or ramp for entrance   Can travel by private vehicle        Equipment Recommendations BSC/3in1;Wheelchair (measurements PT);Wheelchair cushion (measurements PT);Hospital bed (pending progress)  Recommendations for Other Services  Rehab consult    Functional Status Assessment Patient has had a recent decline in their functional status and demonstrates the ability to make significant improvements in function in a reasonable and predictable amount of time.     Precautions / Restrictions Precautions Precautions: Fall Restrictions Weight Bearing Restrictions Per Provider Order: No      Mobility  Bed Mobility Overal bed mobility: Needs Assistance Bed Mobility: Supine to Sit     Supine to sit: Min assist, HOB elevated, Used rails     General bed mobility comments: Extra time with pt bringing each leg off EOB while pivoting trunk on bed then ascending trunk using rails, with minA to ascend trunk    Transfers Overall transfer level: Needs assistance Equipment used: Rolling walker (2 wheels) Transfers: Sit to/from Stand, Bed to chair/wheelchair/BSC Sit to Stand: Mod assist   Step pivot transfers: Max assist  General transfer comment: Cued pt to push up from bed and use L leg more than R to reduce R leg pain as able. Pt pulled up on RW instead and did not use R leg at all, modA to power up to stand and balance. Cues needed to extend hips and push  down on RW with hands rather than resting forearms on RW. MaxA needed for balance step pivoting to R to recliner as pt leaned posteriorly progressively more and pivoted heel-to on L leg to get to chair. Pt almost sitting prematurely, needing maxA to bring her safely into the chair    Ambulation/Gait Ambulation/Gait assistance: Max assist Gait Distance (Feet): 3 Feet Assistive device: Rolling walker (2 wheels) Gait Pattern/deviations: Leaning posteriorly, Trunk flexed (hop-to/pivot) Gait velocity: reduced Gait velocity interpretation: <1.31 ft/sec, indicative of household ambulator   General Gait Details: Cued pt to push down on RW to offload R leg to hop with L but pt opted to be NWB on R leg and instead pivot heel-to on L leg until she reached the chair. She leaned posteriorly throughout and almost sat prematurely, needing maxA for balance and to safely direct her to the chair  Stairs            Wheelchair Mobility     Tilt Bed    Modified Rankin (Stroke Patients Only)       Balance Overall balance assessment: Needs assistance Sitting-balance support: Bilateral upper extremity supported, Feet supported Sitting balance-Leahy Scale: Poor Sitting balance - Comments: UE support, CGA Postural control: Posterior lean Standing balance support: Bilateral upper extremity supported, During functional activity, Reliant on assistive device for balance Standing balance-Leahy Scale: Poor Standing balance comment: reliant on RW and maxA                             Pertinent Vitals/Pain Pain Assessment Pain Assessment: Faces Faces Pain Scale: Hurts whole lot Pain Location: bil legs (R>L), abdomen from chronic hernia per pt Pain Descriptors / Indicators: Discomfort, Guarding, Grimacing, Moaning Pain Intervention(s): Limited activity within patient's tolerance, Monitored during session, Premedicated before session, Repositioned    Home Living Family/patient expects to be  discharged to:: Private residence Living Arrangements: Alone Available Help at Discharge: Family;Friend(s) (pt unsure if can get 24/7 care or not) Type of Home: House Home Access: Stairs to enter Entrance Stairs-Rails: None Entrance Stairs-Number of Steps: 1 (short)   Home Layout: Two level;Able to live on main level with bedroom/bathroom (only uses the main floor) Home Equipment: Rolling Walker (2 wheels);Rollator (4 wheels);Cane - quad Additional Comments: sleeps on a day bed    Prior Function Prior Level of Function : Independent/Modified Independent;Driving             Mobility Comments: No AD in the home, uses QC when she goes to the stores; no recent falls       Extremity/Trunk Assessment   Upper Extremity Assessment Upper Extremity Assessment: Defer to OT evaluation    Lower Extremity Assessment Lower Extremity Assessment: RLE deficits/detail;LLE deficits/detail RLE Deficits / Details: hx of neuropathy in feet, but sensation more diminished than usual inferior to R knee; fasciotomy; limited dorsiflexion ROM, PROM to approximately -10', limited by pain; dorsiflexion MMT score of 2+ RLE Sensation: history of peripheral neuropathy;decreased light touch LLE Deficits / Details: hx of peripheral neuropathy LLE Sensation: history of peripheral neuropathy       Communication   Communication Communication: No apparent difficulties  Cognition Arousal: Alert Behavior During Therapy: WFL for tasks assessed/performed   PT - Cognitive impairments: No family/caregiver present to determine baseline, Attention                       PT - Cognition Comments: Pt tangential and needs redirecting at times. May be baseline Following commands: Impaired Following commands impaired: Follows multi-step commands with increased time     Cueing Cueing Techniques: Verbal cues, Tactile cues     General Comments General comments (skin integrity, edema, etc.): VSS on RA     Exercises     Assessment/Plan    PT Assessment Patient needs continued PT services  PT Problem List Decreased strength;Decreased activity tolerance;Decreased range of motion;Decreased balance;Decreased mobility;Decreased knowledge of use of DME;Impaired sensation;Decreased skin integrity;Pain       PT Treatment Interventions DME instruction;Gait training;Stair training;Functional mobility training;Therapeutic activities;Therapeutic exercise;Balance training;Neuromuscular re-education;Patient/family education    PT Goals (Current goals can be found in the Care Plan section)  Acute Rehab PT Goals Patient Stated Goal: to improve PT Goal Formulation: With patient Time For Goal Achievement: 04/18/24 Potential to Achieve Goals: Good    Frequency Min 3X/week     Co-evaluation               AM-PAC PT 6 Clicks Mobility  Outcome Measure Help needed turning from your back to your side while in a flat bed without using bedrails?: A Little Help needed moving from lying on your back to sitting on the side of a flat bed without using bedrails?: A Little Help needed moving to and from a bed to a chair (including a wheelchair)?: A Lot Help needed standing up from a chair using your arms (e.g., wheelchair or bedside chair)?: A Lot Help needed to walk in hospital room?: Total Help needed climbing 3-5 steps with a railing? : Total 6 Click Score: 12    End of Session Equipment Utilized During Treatment: Gait belt Activity Tolerance: Patient limited by pain Patient left: in chair;with call bell/phone within reach;with chair alarm set Nurse Communication: Mobility status PT Visit Diagnosis: Unsteadiness on feet (R26.81);Other abnormalities of gait and mobility (R26.89);Muscle weakness (generalized) (M62.81);Difficulty in walking, not elsewhere classified (R26.2);Pain Pain - Right/Left: Left Pain - part of body: Leg    Time: 0816-0850 PT Time Calculation (min) (ACUTE ONLY): 34  min   Charges:   PT Evaluation $PT Eval Moderate Complexity: 1 Mod PT Treatments $Therapeutic Activity: 8-22 mins PT General Charges $$ ACUTE PT VISIT: 1 Visit         Theo Ferretti, PT, DPT Acute Rehabilitation Services  Office: [phone]   Theo CHRISTELLA Ferretti 04/04/2024, 10:19 AM

## 2024-04-04 NOTE — Progress Notes (Signed)
 EKG with inferolateral mild ST depressions with TWI. Echo & trop pending. No baseline echo in our system.  Leita SHAUNNA Gaskins, DO 04/04/2024 10:43 AM [facility]  For contact information, see Amion. If no response to pager, please call PCCM consult pager. After hours, 7PM- 7AM, please call Elink.

## 2024-04-04 NOTE — Progress Notes (Signed)
 Subjective  - POD # 1, status post follow-up lysis study with right peroneal artery angioplasty and right SFA stent.  She required a return trip to the operating room for right leg compartment syndrome and loss of signals in the left leg.  She underwent 4 compartment fasciotomy on the right and left iliofemoral endarterectomy with patch angioplasty  Complaining of pain this morning but the pain in her feet is similar to baseline with her neuropathy.   Physical Exam:  Brisk right peroneal signal and biphasic left Left groin is soft with ecchymosis. Fasciotomies soft       Assessment/Plan:  POD #1  Acute blood loss anemia: Hemoglobin 7.5, will give another unit of blood Cardiac: Had ST depression in the operating room.  EKG postop was without signs of cardiac ischemia.  Further workup ongoing Vascular: CK was elevated yesterday to 3000.  I will repeat this this morning.  Good signals bilaterally Continue aspirin  and Plavix  Progressive mobilization Urology following having hematuria.  Consider urology consult Diabetes: Continue sliding scale.  Baseline A1c was 10.3.  Audrey [name] 04/04/2024 10:26 AM --  Vitals:   04/04/24 1000 04/04/24 1015  BP: (!) 120/54 (!) 124/52  Pulse: 82 84  Resp: (!) 9 (!) 21  Temp:  98.2 F (36.8 C)  SpO2: 98% 94%    Intake/Output Summary (Last 24 hours) at 04/04/2024 1026 Last data filed at 04/04/2024 1004 Gross per 24 hour  Intake 2968.81 ml  Output 925 ml  Net 2043.81 ml     Laboratory CBC    Component Value Date/Time   WBC 9.2 04/04/2024 0433   HGB 7.5 (L) 04/04/2024 0433   HGB 12.6 12/03/2023 1359   HGB 11.2 (L) 04/04/2017 0830   HCT 23.9 (L) 04/04/2024 0433   HCT 35.2 04/04/2017 0830   PLT 152 04/04/2024 0433   PLT 233 12/03/2023 1359   PLT 250 04/04/2017 0830    BMET    Component Value Date/Time   NA 137 04/04/2024 0433   NA 140 04/04/2017 0830   K 4.3 04/04/2024 0433   K 4.1 04/04/2017 0830   CL 108  04/04/2024 0433   CO2 19 (L) 04/04/2024 0433   CO2 21 (L) 04/04/2017 0830   GLUCOSE 233 (H) 04/04/2024 0433   GLUCOSE 107 04/04/2017 0830   BUN 30 (H) 04/04/2024 0433   BUN 37.0 (H) 04/04/2017 0830   CREATININE 1.68 (H) 04/04/2024 0433   CREATININE 1.67 (H) 12/03/2023 1359   CREATININE 2.2 (H) 04/04/2017 0830   CALCIUM  7.4 (L) 04/04/2024 0433   CALCIUM  10.1 04/04/2017 0830   GFRNONAA 30 (L) 04/04/2024 0433   GFRNONAA 31 (L) 12/03/2023 1359   GFRAA 30 (L) 01/19/2020 0835    COAG Lab Results  Component Value Date   INR 1.1 09/01/2018   INR 0.87 01/08/2018   INR 1.04 06/27/2016   No results found for: PTT  Antibiotics Anti-infectives (From admission, onward)    Start     Dose/Rate Route Frequency Ordered Stop   04/03/24 1900  ceFAZolin  (ANCEF ) IVPB 2g/100 mL premix  Status:  Discontinued        2 g 200 mL/hr over 30 Minutes Intravenous Every 8 hours 04/03/24 1721 04/03/24 1750   04/03/24 1800  cefTRIAXone  (ROCEPHIN ) 1 g in sodium chloride  0.9 % 100 mL IVPB        1 g 200 mL/hr over 30 Minutes Intravenous Every 24 hours 04/03/24 1658  ALONSO Malvina Serene MADISON, M.D., [facility] [place] Office: [phone] Pager:  [phone]

## 2024-04-04 NOTE — Progress Notes (Signed)
 eLink Physician-Brief Progress Note Patient Name: Audrey [name] DOB: [birth year] MRN: [id number]   Date of Service  04/04/2024  HPI/Events of Note  Dilaudid  or tramadol  not helping with vascular surgical pain, able to take PO's  I suggested Gabapentin  to patient and she states it makes her loopy hence listed as allergy but she was willing to take this for the pain  eICU Interventions  Ordered a one time dose of Gabapentin  100 mg PO prn pain     Intervention Category Intermediate Interventions: Pain - evaluation and management  [name] 04/04/2024, 6:50 AM

## 2024-04-04 NOTE — Progress Notes (Signed)
 eLink Physician-Brief Progress Note Patient Name: Audrey [name] DOB: [birth year] MRN: [id number]   Date of Service  04/04/2024  HPI/Events of Note  Received inquiry if MAP should be >65 Currently BP is 120/38 with MAP 62 No signs of hypoperfusion  eICU Interventions  Already started on steroids Would continue to monitor for now Discussed with [facility]     Intervention Category Intermediate Interventions: Hypotension - evaluation and management  [name] 04/04/2024, 2:01 AM

## 2024-04-04 NOTE — Progress Notes (Signed)
 Inpatient Rehab Admissions Coordinator Note:   Per PT patient was screened for CIR candidacy by Ridgely Anastacio SHAUNNA Yvone Cohens, CCC-SLP. At this time, pt appears to be a potential candidate for CIR. If pt would like to be considered, please place an IP Rehab MD consult order.   Tinnie Yvone Cohens, MS, CCC-SLP Admissions Coordinator [phone] 04/04/2024 12:48 PM

## 2024-04-04 NOTE — PMR Pre-admission (Signed)
 PMR Admission Coordinator Pre-Admission Assessment  Patient: Audrey [name] is an 83 y.o., female MRN: [id number] DOB: [birth year] Height: 4' 9.99 (147.3 cm) Weight: 54.4 kg  Insurance Information HMO:     PPO:      PCP:      IPA:      80/20: yes     OTHER:  PRIMARY: Medicare Part A and B      Policy#: [id number]      Subscriber: patient CM Name:       Phone#:      Fax#:  Pre-Cert#:       Employer:  Benefits:  Phone #: verified eligibility via [employer] on 04/04/14     Name:  Eff. Date: Part A and B effective 02/21/07     Deduct: $1,676      Out of Pocket Max: NA      Life Max: NA CIR: 100% coverage      SNF: 100% coverage for days 1-20, 80% coverage for days 21-100 Outpatient: 80% coverage     Co-Pay: 20% Home Health: 100% coverage      Co-Pay:  DME: 80% coverage     Co-Pay: 20% Providers: pt's choice SECONDARY: C[employer] Medicare Supplement      Policy#: [id number]     Phone#:   Financial Counselor:       Phone#:   The Data Collection Information Summary for patients in Inpatient Rehabilitation Facilities with attached Privacy Act Statement-Health Care Records was provided and verbally reviewed with: Patient  Emergency Contact Information Contact Information     Name Relation Home Work Mobile   [name] Son [phone]  [phone]   helaine madison Benne [phone]  [phone]      Other Contacts     Name Relation Home Work Mobile   [name] Daughter [phone]  [phone]       Current Medical History  Patient Admitting Diagnosis: critical limb ischemia History of Present Illness: Pt is an 83 year old female with medical hx significant for: DM II, GERD, HTN, HLD, hypothyroidism, CAD/MI, adrenal insufficiency, metastatic renal cell carcinoma (adrenal, bone, pulmonary s/p chemo, left nephrectomy and left adrenal resection 2018) currently in remission, PAD,  right SFA stenting (10/2022), atherectomy and stenting of right SFA, popliteal and TP trunk  (12/29/23). Pt presented to [facility] on 04/02/24 for planned right lower extremity angiogram by Dr. Magda. Found to have thrombosis of right lower extremity arterial stenting. Underwent right peroneal, TP trunk and popliteal artery angioplasty. Pt had follow up lysis study by Dr. Serene on 12/13. Underwent angioplasty to peroneal artery and stent placement of right superficial femoral artery. Post-op pt developed hematoma in left groin with subsequent loss of doppler signals. Also had left groin and right calf pain. Concern for developing compartment syndrome. Underwent 4 compartment right lower extremity fasciotomy and left femoral endarterectomy with patch angioplasty by Dr. Serene on 12/13.*** Therapy evaluations completed and CIR recommended d/t pt's deficits in functional mobility.     Patient's medical record from [facility] has been reviewed by the rehabilitation admission coordinator and physician.  Past Medical History  Past Medical History:  Diagnosis Date   Anemia    Arthritis    lower back, hips, hands   Biliary stricture (HCC)    Diabetes mellitus (HCC)    type 2    Early cataracts, bilateral    Md just watching   Elevated liver enzymes    Family history of adverse reaction to anesthesia    Daughter hard  to wake up   Gallstones    GERD (gastroesophageal reflux disease)    occasional - diet controlled   History of blood transfusion 2018   History of hiatal hernia    HTN (hypertension)    Hyperlipidemia    Hypothyroidism    left renal ca dx'd 2018   renal cancer - left kidney removed, pill chemo x 1 yr   Myocardial infarction (HCC) 1991   no deficits   Peripheral vascular disease    PONV (postoperative nausea and vomiting)    SVD (spontaneous vaginal delivery)    x 3   Wears glasses     Has the patient had major surgery during 100 days prior to admission? Yes  Family History   family history includes Heart attack (age of onset: 62) in her  father; Heart disease (age of onset: 69) in her brother.  Current Medications Current Medications[1]  Patients Current Diet:  Diet Order             Diet heart healthy/carb modified Room service appropriate? Yes; Fluid consistency: Thin  Diet effective now                   Precautions / Restrictions Precautions Precautions: Fall Restrictions Weight Bearing Restrictions Per Provider Order: No   Has the patient had 2 or more falls or a fall with injury in the past year? No  Prior Activity Level Community (5-7x/wk): gets out of house ~2-3 days/week; drives  Prior Functional Level Self Care: Did the patient need help bathing, dressing, using the toilet or eating? Independent  Indoor Mobility: Did the patient need assistance with walking from room to room (with or without device)? Independent  Stairs: Did the patient need assistance with internal or external stairs (with or without device)? Unknown (pt avoids steps)  Functional Cognition: Did the patient need help planning regular tasks such as shopping or remembering to take medications? Independent  Patient Information Are you of Hispanic, Latino/a,or Spanish origin?: A. No, not of Hispanic, Latino/a, or Spanish origin What is your race?: A. White Do you need or want an interpreter to communicate with a doctor or health care staff?: 0. No  Patient's Response To:  Health Literacy and Transportation Is the patient able to respond to health literacy and transportation needs?: Yes Health Literacy - How often do you need to have someone help you when you read instructions, pamphlets, or other written material from your doctor or pharmacy?: Never In the past 12 months, has lack of transportation kept you from medical appointments or from getting medications?: No In the past 12 months, has lack of transportation kept you from meetings, work, or from getting things needed for daily living?: No  Home Assistive Devices /  Equipment Home Equipment: Agricultural Consultant (2 wheels), Rollator (4 wheels), [employer] - quad  Prior Device Use: Indicate devices/aids used by the patient prior to current illness, exacerbation or injury? cane  Current Functional Level Cognition  Orientation Level: Oriented X4    Extremity Assessment (includes Sensation/Coordination)  Upper Extremity Assessment: Defer to OT evaluation  Lower Extremity Assessment: RLE deficits/detail, LLE deficits/detail RLE Deficits / Details: hx of neuropathy in feet, but sensation more diminished than usual inferior to R knee; fasciotomy; limited dorsiflexion ROM, PROM to approximately -10', limited by pain; dorsiflexion MMT score of 2+ RLE Sensation: history of peripheral neuropathy, decreased light touch LLE Deficits / Details: hx of peripheral neuropathy LLE Sensation: history of peripheral neuropathy    ADLs  Mobility  Overal bed mobility: Needs Assistance Bed Mobility: Supine to Sit Supine to sit: Min assist, HOB elevated, Used rails General bed mobility comments: Extra time with pt bringing each leg off EOB while pivoting trunk on bed then ascending trunk using rails, with minA to ascend trunk    Transfers  Overall transfer level: Needs assistance Equipment used: Rolling walker (2 wheels) Transfers: Sit to/from Stand, Bed to chair/wheelchair/BSC Sit to Stand: Mod assist Bed to/from chair/wheelchair/BSC transfer type:: Step pivot Step pivot transfers: Max assist General transfer comment: Cued pt to push up from bed and use L leg more than R to reduce R leg pain as able. Pt pulled up on RW instead and did not use R leg at all, modA to power up to stand and balance. Cues needed to extend hips and push down on RW with hands rather than resting forearms on RW. MaxA needed for balance step pivoting to R to recliner as pt leaned posteriorly progressively more and pivoted heel-to on L leg to get to chair. Pt almost sitting prematurely, needing maxA to  bring her safely into the chair    Ambulation / Gait / Stairs / Wheelchair Mobility  Ambulation/Gait Ambulation/Gait assistance: Max Chemical Engineer (Feet): 3 Feet Assistive device: Rolling walker (2 wheels) Gait Pattern/deviations: Leaning posteriorly, Trunk flexed (hop-to/pivot) General Gait Details: Cued pt to push down on RW to offload R leg to hop with L but pt opted to be NWB on R leg and instead pivot heel-to on L leg until she reached the chair. She leaned posteriorly throughout and almost sat prematurely, needing maxA for balance and to safely direct her to the chair Gait velocity: reduced Gait velocity interpretation: <1.31 ft/sec, indicative of household ambulator    Posture / Balance Dynamic Sitting Balance Sitting balance - Comments: UE support, CGA Balance Overall balance assessment: Needs assistance Sitting-balance support: Bilateral upper extremity supported, Feet supported Sitting balance-Leahy Scale: Poor Sitting balance - Comments: UE support, CGA Postural control: Posterior lean Standing balance support: Bilateral upper extremity supported, During functional activity, Reliant on assistive device for balance Standing balance-Leahy Scale: Poor Standing balance comment: reliant on RW and maxA    Special considerations/life events  Continuous Drip IV  ***, Skin Erythema/Redness: groin, perineum/bilateral; Wound: Contact deratitis; labia/right, left, lateral; Surgical incision: groin/left; Surgical incision: leg/left, and Diabetic management Lantus  5 units 2x daily; Novolog  0-9 units every 4 hours;    Previous Home Environment (from acute therapy documentation) Living Arrangements: Alone Available Help at Discharge: Family, Friend(s) (pt unsure if can get 24/7 care or not) Type of Home: House Home Layout: Two level, Able to live on main level with bedroom/bathroom (only uses the main floor) Home Access: Stairs to enter Entrance Stairs-Rails: None Entrance  Stairs-Number of Steps: 1 (short) Bathroom Shower/Tub: Engineer, Manufacturing Systems: Handicapped height Bathroom Accessibility: Yes How Accessible: Accessible via walker Home Care Services: No Additional Comments: sleeps on a day bed  Discharge Living Setting Does the patient have any problems obtaining your medications?: No  Social/Family/Support Systems    Goals    Decrease burden of Care through IP rehab admission: NA  Possible need for SNF placement upon discharge: Not anticipated  Patient Condition: I have reviewed medical records from [facility], spoken with CM, and {CHL IP Patient Spouse Son Daughter Family Member:304550004}. I {CHL IP at bedside or phone:304550005} for inpatient rehabilitation assessment.  Patient will benefit from ongoing PT and OT, can actively participate in 3 hours of therapy  a day 5 days of the week, and can make measurable gains during the admission.  Patient will also benefit from the coordinated team approach during an Inpatient Acute Rehabilitation admission.  The patient will receive intensive therapy as well as Rehabilitation physician, nursing, social worker, and care management interventions.  Due to bladder management, safety, skin/wound care, disease management, medication administration, pain management, and patient education the patient requires 24 hour a day rehabilitation nursing.  The patient is currently *** with mobility and basic ADLs.  Discharge setting and therapy post discharge at home with home health is anticipated.  Patient has agreed to participate in the Acute Inpatient Rehabilitation Program and will admit {Time; today/tomorrow:10263}.  Preadmission Screen Completed By:  Tinnie SHAUNNA Yvone Delayne, 04/04/2024 2:59 PM ______________________________________________________________________   Discussed status with Dr. PIERRETTE on *** at *** and received approval for admission today.  Admission Coordinator:  Tinnie SHAUNNA Yvone Delayne,  CCC-SLP, time ***/Date ***   Assessment/Plan: Diagnosis: *** Does the need for close, 24 hr/day Medical supervision in concert with the patient's rehab needs make it unreasonable for this patient to be served in a less intensive setting? {yes_no_potentially:3041433} Co-Morbidities requiring supervision/potential complications: *** Due to {due un:6958565}, does the patient require 24 hr/day rehab nursing? {yes_no_potentially:3041433} Does the patient require coordinated care of a physician, rehab nurse, PT, OT, and SLP to address physical and functional deficits in the context of the above medical diagnosis(es)? {yes_no_potentially:3041433} Addressing deficits in the following areas: {deficits:3041436} Can the patient actively participate in an intensive therapy program of at least 3 hrs of therapy 5 days a week? {yes_no_potentially:3041433} The potential for patient to make measurable gains while on inpatient rehab is {potential:3041437} Anticipated functional outcomes upon discharge from inpatient rehab: {functional outcomes:304600100} PT, {functional outcomes:304600100} OT, {functional outcomes:304600100} SLP Estimated rehab length of stay to reach the above functional goals is: *** Anticipated discharge destination: {anticipated dc setting:21604} 10. Overall Rehab/Functional Prognosis: {potential:3041437}   MD Signature: ***     [1]  Current Facility-Administered Medications:    0.9 %  sodium chloride  infusion, 500 mL, Intravenous, Once PRN, Rhyne, Samantha J, PA-C   0.9 %  sodium chloride  infusion, , Intravenous, Continuous, Gretta Leita SHAUNNA, DO, Last Rate: 50 mL/hr at 04/04/24 1300, Infusion Verify at 04/04/24 1300   acetaminophen  (TYLENOL ) tablet 325-650 mg, 325-650 mg, Oral, Q4H PRN, 650 mg at 04/04/24 1422 **OR** acetaminophen  (TYLENOL ) suppository 325-650 mg, 325-650 mg, Rectal, Q4H PRN, Rhyne, Samantha J, PA-C   aspirin  EC tablet 81 mg, 81 mg, Oral, Daily, Simpson, Vina B, NP, 81  mg at 04/04/24 0907   cefTRIAXone  (ROCEPHIN ) 1 g in sodium chloride  0.9 % 100 mL IVPB, 1 g, Intravenous, Q24H, Antonetta Vina B, NP, Stopped at 04/03/24 2028   Chlorhexidine  Gluconate Cloth 2 % PADS 6 each, 6 each, Topical, Daily, Rhyne, Samantha J, PA-C, 6 each at 04/04/24 1004   clobetasol  cream (TEMOVATE ) 0.05 %, , Topical, BID, Rhyne, Samantha J, PA-C, Given at 04/04/24 1005   clopidogrel  (PLAVIX ) tablet 75 mg, 75 mg, Oral, QHS, Simpson, Paula B, NP, 75 mg at 04/03/24 2117   Gerhardt's butt cream, , Topical, BID, Gretta Leita SHAUNNA, DO, Given at 04/04/24 1004   heparin  injection 5,000 Units, 5,000 Units, Subcutaneous, Q8H, Gretta Leita SHAUNNA, DO, 5,000 Units at 04/04/24 1422   hydrocortisone  (CORTEF ) tablet 10 mg, 10 mg, Oral, Daily, Antonetta Vina B, NP, 10 mg at 04/04/24 9093   hydrocortisone  (CORTEF ) tablet 5 mg, 5 mg, Oral, QHS, Simpson, Vina NOVAK, NP, 5  mg at 04/03/24 2154   HYDROmorphone  (DILAUDID ) injection 2 mg, 2 mg, Intravenous, Q2H PRN, Serene Gaile ORN, MD   insulin  aspart (novoLOG ) injection 0-9 Units, 0-9 Units, Subcutaneous, Q4H, Simpson, Paula B, NP, 3 Units at 04/04/24 1123   insulin  glargine (LANTUS ) injection 5 Units, 5 Units, Subcutaneous, BID, Uhlorn, Garett M, RPH, 5 Units at 04/04/24 1123   levothyroxine  (SYNTHROID ) tablet 75 mcg, 75 mcg, Oral, QAC breakfast, Antonetta Moccasin B, NP, 75 mcg at 04/04/24 0556   ondansetron  (ZOFRAN ) injection 4 mg, 4 mg, Intravenous, Q6H PRN, Rhyne, Samantha J, PA-C   oxyCODONE  (Oxy IR/ROXICODONE ) immediate release tablet 10 mg, 10 mg, Oral, Q4H PRN, Serene Gaile ORN, MD, 10 mg at 04/04/24 0907   phenol (CHLORASEPTIC) mouth spray 1 spray, 1 spray, Mouth/Throat, PRN, Rhyne, Samantha J, PA-C   potassium chloride  SA (KLOR-CON  M) CR tablet 40-60 mEq, 40-60 mEq, Oral, Daily PRN, Rhyne, Samantha J, PA-C   sodium chloride  flush (NS) 0.9 % injection 3 mL, 3 mL, Intravenous, Q12H, Rhyne, Samantha J, PA-C, 3 mL at 04/04/24 1004   sodium chloride  flush (NS) 0.9 %  injection 3 mL, 3 mL, Intravenous, PRN, Rhyne, Samantha J, PA-C

## 2024-04-04 NOTE — Progress Notes (Signed)
°  Echocardiogram 2D Echocardiogram has been performed.  Audrey [name] 04/04/2024, 4:01 PM

## 2024-04-05 ENCOUNTER — Encounter (HOSPITAL_COMMUNITY): Payer: Self-pay | Admitting: Surgery

## 2024-04-05 DIAGNOSIS — N390 Urinary tract infection, site not specified: Secondary | ICD-10-CM

## 2024-04-05 LAB — TYPE AND SCREEN
ABO/RH(D): O POS
Antibody Screen: NEGATIVE
Unit division: 0

## 2024-04-05 LAB — GLUCOSE, CAPILLARY
Glucose-Capillary: 194 mg/dL — ABNORMAL HIGH (ref 70–99)
Glucose-Capillary: 169 mg/dL — ABNORMAL HIGH (ref 70–99)
Glucose-Capillary: 209 mg/dL — ABNORMAL HIGH (ref 70–99)
Glucose-Capillary: 262 mg/dL — ABNORMAL HIGH (ref 70–99)
Glucose-Capillary: 235 mg/dL — ABNORMAL HIGH (ref 70–99)

## 2024-04-05 LAB — LIPID PANEL
Cholesterol: 94 mg/dL (ref 0–200)
HDL: 14 mg/dL — ABNORMAL LOW (ref >40)
LDL Cholesterol: 43 mg/dL (ref 0–99)
Total CHOL/HDL Ratio: 6.7 ratio
Triglycerides: 184 mg/dL — ABNORMAL HIGH (ref <150)
VLDL: 37 mg/dL (ref 0–40)

## 2024-04-05 LAB — BPAM RBC
Blood Product Expiration Date: 202512202359
ISSUE DATE / TIME: 202512140951
Unit Type and Rh: 5100

## 2024-04-05 LAB — BASIC METABOLIC PANEL WITH GFR
Anion gap: 8 (ref 5–15)
BUN: 29 mg/dL — ABNORMAL HIGH (ref 8–23)
CO2: 18 mmol/L — ABNORMAL LOW (ref 22–32)
Calcium: 7.9 mg/dL — ABNORMAL LOW (ref 8.9–10.3)
Chloride: 113 mmol/L — ABNORMAL HIGH (ref 98–111)
Creatinine, Ser: 1.67 mg/dL — ABNORMAL HIGH (ref 0.44–1.00)
GFR, Estimated: 30 mL/min — ABNORMAL LOW (ref >60)
Glucose, Bld: 195 mg/dL — ABNORMAL HIGH (ref 70–99)
Potassium: 4.2 mmol/L (ref 3.5–5.1)
Sodium: 139 mmol/L (ref 135–145)

## 2024-04-05 LAB — CBC
HCT: 28.6 % — ABNORMAL LOW (ref 36.0–46.0)
Hemoglobin: 9.7 g/dL — ABNORMAL LOW (ref 12.0–15.0)
MCH: 30.6 pg (ref 26.0–34.0)
MCHC: 33.9 g/dL (ref 30.0–36.0)
MCV: 90.2 fL (ref 80.0–100.0)
Platelets: 161 K/uL (ref 150–400)
RBC: 3.17 MIL/uL — ABNORMAL LOW (ref 3.87–5.11)
RDW: 17.3 % — ABNORMAL HIGH (ref 11.5–15.5)
WBC: 9.5 K/uL (ref 4.0–10.5)
nRBC: 0 % (ref 0.0–0.2)

## 2024-04-05 LAB — CK: Total CK: 1107 U/L — ABNORMAL HIGH (ref 38–234)

## 2024-04-05 LAB — TSH: TSH: 1.379 u[IU]/mL (ref 0.350–4.500)

## 2024-04-05 MED ORDER — STERILE WATER FOR INJECTION IV SOLN
INTRAVENOUS | Status: AC
  Filled 2024-04-05: qty 1000
  Filled 2024-04-05: qty 150

## 2024-04-05 MED ORDER — AMLODIPINE BESYLATE 5 MG PO TABS
5.0000 mg | ORAL_TABLET | Freq: Every day | ORAL | Status: DC
  Administered 2024-04-05 – 2024-04-06 (×2): 5 mg via ORAL
  Filled 2024-04-05 (×2): qty 1

## 2024-04-05 MED ORDER — CARVEDILOL 6.25 MG PO TABS
6.2500 mg | ORAL_TABLET | Freq: Two times a day (BID) | ORAL | Status: DC
  Administered 2024-04-05 – 2024-04-06 (×3): 6.25 mg via ORAL
  Filled 2024-04-05 (×3): qty 1

## 2024-04-05 NOTE — Inpatient Diabetes Management (Signed)
 Inpatient Diabetes Program Recommendations  AACE/ADA: New Consensus Statement on Inpatient Glycemic Control   Target Ranges:  Prepandial:   less than 140 mg/dL      Peak postprandial:   less than 180 mg/dL (1-2 hours)      Critically ill patients:  140 - 180 mg/dL   Lab Results  Component Value Date   GLUCAP 209 (H) 04/05/2024   HGBA1C 10.3 (H) 04/02/2024    Latest Reference Range & Units 04/04/24 11:17 04/04/24 16:13 04/04/24 19:17 04/04/24 23:19 04/05/24 03:52 04/05/24 06:53 04/05/24 11:16  Glucose-Capillary 70 - 99 mg/dL 781 (H) 707 (H) 661 (H) 181 (H) 194 (H) 169 (H) 209 (H)   Review of Glycemic Control  Diabetes history: DM2  Outpatient Diabetes medications:  Repaglinide  0.5mg  BID before meals   Current orders for Inpatient glycemic control:  Lantus  5 units BID  Novolog  0-9 units Q4HRS   Inpatient Diabetes Program Recommendations:   Spoke with patient at bedside. Patient reports being followed by Endo, Dr Faythe, for diabetes management and was only taking Repaglinide  0.5mg  BID before meals prior to admission. Patient states she had been prescribed insulin  pens by Dr Faythe but never picked it up from the pharmacy prior to admission.  Patient reports checking glucose 1 time per day. Inquired about prior A1C and patient reports not being able to recall last A1C value. Discussed A1C results of 10.3% and explained that current A1C indicates an average glucose of 249 mg/dl over the past 2-3 months. Discussed glucose and A1C goals. Discussed importance of checking CBGs and maintaining good CBG control to prevent long-term and short-term complications. Discussed impact of nutrition, stress, sickness, and medications on diabetes control. Encouraged patient to check glucose 4 times per day (before meals and at bedtime). Patient states she has had diabetes for a very long time and understands the importance of controlling her blood glucose, she doesn't understand why it has been so high  recently. Bedside RNs to provide ongoing basic DM education at bedside with this patient and engage patient to actively check blood glucose and administer insulin  injections. Patient verbalized understanding of information discussed and reports no further questions at this time related to diabetes.  Thanks,  Lavanda Search, RN, MSN, [facility]  Inpatient Diabetes Coordinator  Pager [phone] (8a-5p)

## 2024-04-05 NOTE — TOC Initial Note (Signed)
 Transition of Care [facility]) - Initial/Assessment Note    Patient Details  Name: Audrey [name] MRN: [id number] Date of Birth: Aug 25, 1940  [facility] CM/SW Contact:    Sudie Erminio Deems, RN Phone Number: 04/05/2024, 4:35 PM  Clinical Narrative: Patient POD-2  right leg 4 compartment fasciotomy, L iliofemoral endarterectomy with bovine pericardial patch angioplasty, and L leg angiogram. PTA patient was from home alone. Patient has support of daughter. Patient states she is independent and does not use any DME. Patient states she still drives and gets her own groceries. Patient has PCP Dr. Ozell Polite in [place] . PT/OT recommendations are for CIR. ICM will continue to follow for additional needs as the patient progresses.            Expected Discharge Plan: IP Rehab Facility Barriers to Discharge: Continued Medical Work up   Expected Discharge Plan and Services In-house Referral: NA Discharge Planning Services: CM Consult   Living arrangements for the past 2 months: Single Family Home     Prior Living Arrangements/Services Living arrangements for the past 2 months: Single Family Home   Patient language and need for interpreter reviewed:: Yes              Criminal Activity/Legal Involvement Pertinent to Current Situation/Hospitalization: No - Comment as needed  Activities of Daily Living   ADL Screening (condition at time of admission) Independently performs ADLs?: Yes (appropriate for developmental age) Is the patient deaf or have difficulty hearing?: No Does the patient have difficulty seeing, even when wearing glasses/contacts?: No Does the patient have difficulty concentrating, remembering, or making decisions?: No  Permission Sought/Granted Permission sought to share information with : Case Manager, Family Supports                Emotional Assessment       Orientation: : Oriented to Place, Oriented to Self Alcohol  / Substance Use: Not  Applicable Psych Involvement: No (comment)  Admission diagnosis:  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) [I74.3] Patient Active Problem List   Diagnosis Date Noted   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) 04/02/2024   Critical limb ischemia of right lower extremity with ulceration of foot (HCC) 12/29/2023   PAD (peripheral artery disease) 10/21/2022   Atrophy of thyroid  (acquired) 01/15/2022   At risk for falls 01/15/2022   CKD (chronic kidney disease) 01/15/2022   Diabetic ulcer of foot with fat layer exposed (HCC) 01/15/2022   Dizziness 01/15/2022   Dysuria 01/15/2022   Knee osteoarthritis 01/15/2022   Pulmonary nodule 01/15/2022   Type 2 diabetes mellitus (HCC) 08/15/2021   Incisional hernia 06/23/2020   Gastritis and gastroduodenitis 02/26/2020   Rectal discomfort 02/26/2020   History of colonic polyps 02/26/2020   Ventral hernia 11/10/2019   History of cholecystectomy 07/17/2019   Degeneration of lumbar intervertebral disc 11/12/2018   Anemia 10/10/2018   Chronic cholecystitis 09/04/2018   Acute cholecystitis 08/29/2018   Biliary stricture (HCC) 08/23/2018   Medicare annual wellness visit, subsequent 08/14/2018   CAD (coronary artery disease) 08/14/2018   Dyslipidemia 08/14/2018   Abnormal LFTs 08/05/2018   Choledocholithiasis 08/05/2018   Dilated bile duct 08/05/2018   History of ERCP 08/05/2018   History of renal cell carcinoma 08/05/2018   Port-A-Cath in place 01/30/2018   Counseling regarding advance care planning and goals of care 01/04/2018   Renal cell carcinoma, left (HCC) 04/11/2017   Cancer, 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HCC) 01/10/2017   Left renal mass 08/01/2016  Hyperlipidemia 05/23/2015   Essential hypertension 05/23/2015   Hypothyroidism 05/23/2015   PCP:  Irven Ozell DEL, MD Pharmacy:   [facility] [phone] [place], [place] - [address] [facility] [address] [place] [address] Phone:  [phone] Fax: [phone]  [facility] [address], [place] - [address] [place] [address] Phone: [phone] Fax: [phone]     Social Drivers of Health (SDOH) Social History: SDOH Screenings   Food Insecurity: No Food Insecurity (04/02/2024)  Housing: Low Risk (04/02/2024)  Transportation Needs: No Transportation Needs (04/02/2024)  Utilities: Not At Risk (04/02/2024)  Financial Resource Strain: Low Risk (05/23/2022)  Social Connections: Socially Isolated (04/02/2024)  Tobacco Use: Medium Risk (04/03/2024)   SDOH Interventions:     Readmission Risk Interventions    10/22/2022   12:17 PM  Readmission Risk Prevention Plan  Post Dischage Appt --  Medication Screening Complete  Transportation Screening Complete

## 2024-04-05 NOTE — Evaluation (Addendum)
 Occupational Therapy Evaluation Patient Details Name: Audrey [name] MRN: [id number] DOB: 1941/02/07 Today's Date: 04/05/2024   History of Present Illness   Pt is an 83 y.o. female who presented 04/01/24 for aortogram, R leg angiogram with second and third order cannulation, R peroneal, TP trunk, popliteal artery angioplasty, and initiation of catheter directed thrombolysis secondary to thrombosis of R leg artery stenting. S/p follow-up lysis with R leg angiogram, angioplasty R peroneal artery and tibioperoneal trunk, R superficial femoral artery stent 12/13, after procedure pt developed concerns for compartment syndrome. S/p R leg 4 compartment fasciotomy, L iliofemoral endarterectomy with bovine pericardial patch angioplasty, and L leg angiogram 12/13. 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 Pt currently needs set up - max A for ADLs, mod A for bed mobility and mod +2 for transfers with RW. Pt needing physical assist to facilitate weigh shifting to step chair on pt's R side. Noted bleeding on RLE bandage, RN present and aware. Pt presenting with impairments listed below, will follow acutely. Patient will benefit from intensive inpatient follow-up therapy, >3 hours/day to maximize safety/ind with ADL/functional mobility.      If plan is discharge home, recommend the following:   Two people to help with walking and/or transfers;A lot of help with bathing/dressing/bathroom;Assistance with cooking/housework;Direct supervision/assist for financial management;Direct supervision/assist for medications management     Functional Status Assessment   Patient has had a recent decline in their functional status and demonstrates the ability to make significant improvements in function in a  reasonable and predictable amount of time.     Equipment Recommendations   None recommended by OT     Recommendations for Other Services   Rehab consult;PT consult     Precautions/Restrictions   Precautions Precautions: Fall Restrictions Weight Bearing Restrictions Per Provider Order: No     Mobility Bed Mobility Overal bed mobility: Needs Assistance Bed Mobility: Supine to Sit     Supine to sit: Mod assist     General bed mobility comments: use of bed pad to scoot hips to EOB    Transfers Overall transfer level: Needs assistance Equipment used: Rolling walker (2 wheels) Transfers: Sit to/from Stand, Bed to chair/wheelchair/BSC Sit to Stand: Mod assist, +2 physical assistance     Step pivot transfers: Mod assist, +2 physical assistance     General transfer comment: facilitated weight shifting to step to chair on R side, pt able to place R foot on floor this session      Balance Overall balance assessment: Needs assistance Sitting-balance support: Bilateral upper extremity supported, Feet supported Sitting balance-Leahy Scale: Fair     Standing balance support: Bilateral upper extremity supported, During functional activity, Reliant on assistive device for balance Standing balance-Leahy Scale: Poor Standing balance comment: reliant on RW                           ADL either performed or assessed with clinical judgement   ADL Overall ADL's : Needs assistance/impaired Eating/Feeding: Set up;Sitting   Grooming: Set up;Sitting   Upper Body Bathing: Minimal assistance;Sitting   Lower Body Bathing: Maximal assistance   Upper Body Dressing : Minimal assistance   Lower Body Dressing: Maximal assistance   Toilet Transfer: Maximal assistance  Functional mobility during ADLs: Maximal assistance       Vision   Vision Assessment?: No apparent visual deficits     Perception Perception: Not tested       Praxis Praxis:  Not tested       Pertinent Vitals/Pain Pain Assessment Pain Assessment: Faces Pain Score: 8  Faces Pain Scale: Hurts whole lot Pain Location: RLE Pain Descriptors / Indicators: Discomfort Pain Intervention(s): Limited activity within patient's tolerance, Monitored during session, Repositioned     Extremity/Trunk Assessment Upper Extremity Assessment Upper Extremity Assessment: Generalized weakness   Lower Extremity Assessment Lower Extremity Assessment: Defer to PT evaluation   Cervical / Trunk Assessment Cervical / Trunk Assessment: Kyphotic   Communication Communication Communication: No apparent difficulties   Cognition Arousal: Alert Behavior During Therapy: WFL for tasks assessed/performed Cognition: No apparent impairments                                       Cueing  General Comments   Cueing Techniques: Verbal cues;Tactile cues  VSS on RA   Exercises     Shoulder Instructions      Home Living Family/patient expects to be discharged to:: Private residence Living Arrangements: Alone Available Help at Discharge: Family;Friend(s) Type of Home: House Home Access: Stairs to enter [employer] of Steps: 1 Entrance Stairs-Rails: None Home Layout: Two level;Able to live on main level with bedroom/bathroom     Bathroom Shower/Tub: Tub/shower unit   Bathroom Toilet: Handicapped height Bathroom Accessibility: Yes How Accessible: Accessible via walker Home Equipment: Rolling Walker (2 wheels);Rollator (4 wheels);Cane - quad   Additional Comments: sleeps on a day bed      Prior Functioning/Environment Prior Level of Function : Independent/Modified Independent;Driving             Mobility Comments: No AD in the home, uses QC when she goes to the stores; no recent falls ADLs Comments: ind    OT Problem List: Decreased strength;Decreased range of motion;Decreased activity tolerance;Impaired balance (sitting and/or  standing);Decreased cognition;Decreased safety awareness;Cardiopulmonary status limiting activity   OT Treatment/Interventions: Self-care/ADL training;Therapeutic exercise;Energy conservation;DME and/or AE instruction;Therapeutic activities;Patient/family education;Balance training      OT Goals(Current goals can be found in the care plan section)   Acute Rehab OT Goals Patient Stated Goal: did not state OT Goal Formulation: With patient Time For Goal Achievement: 04/19/24 Potential to Achieve Goals: Good ADL Goals Pt Will Perform Upper Body Dressing: with contact guard assist;sitting Pt Will Perform Lower Body Dressing: with contact guard assist;sit to/from stand;sitting/lateral leans Pt Will Transfer to Toilet: with min assist;ambulating;regular height toilet Additional ADL Goal #1: pt will perform bed mobility CGA in prep for seated/OOB ADLs   OT Frequency:  Min 2X/week    Co-evaluation              AM-PAC OT 6 Clicks Daily Activity     Outcome Measure Help from another person eating meals?: A Little Help from another person taking care of personal grooming?: A Little Help from another person toileting, which includes using toliet, bedpan, or urinal?: A Lot Help from another person bathing (including washing, rinsing, drying)?: A Lot Help from another person to put on and taking off regular upper body clothing?: A Little Help from another person to put on and taking off regular lower body clothing?: A Lot 6 Click Score: 15   End of Session Equipment Utilized During Treatment:  Gait belt;Rolling walker (2 wheels) Nurse Communication: Mobility status; bleeding on RLE bandage (RN notified)  Activity Tolerance: Patient tolerated treatment well Patient left: in chair;with call bell/phone within reach;with chair alarm set;with family/visitor present;with nursing/sitter in room  OT Visit Diagnosis: Unsteadiness on feet (R26.81);Other abnormalities of gait and mobility  (R26.89);Muscle weakness (generalized) (M62.81)                Time: 8457-8390 OT Time Calculation (min): 27 min Charges:  OT General Charges $OT Visit: 1 Visit OT Evaluation $OT Eval Moderate Complexity: 1 Mod OT Treatments $Therapeutic Activity: 8-22 mins  Iyla Balzarini K, OTD, OTR/L [employer] Preferred Acute Rehab [phone]   Laneta POUR Koonce 04/05/2024, 4:40 PM

## 2024-04-05 NOTE — Progress Notes (Addendum)
 NAME:  Audrey [name], MRN:  [id number], DOB:  03-09-1941, LOS: 3 ADMISSION DATE:  04/02/2024, CONSULTATION DATE:  04/03/24 REFERRING MD:  Dr. Serene, CHIEF COMPLAINT:  post op   History of Present Illness:  42 yoF with PMH as below significant for former smoker, DMT2, GERD, HTN, HLD, hypothyroidism, CAD/ MI, adrenal insufficiency, metastatic renal cell carcinoma (adrenal, bone, pulmonary s/p chemo, left nephrectomy and left adrenal resection 2018 ) currently in remission, and PAD on ASA/plavix .  Prior R SFA stenting 7/24 subsequently thrombosed and underwent atherectomy and stenting of right SFA, popliteal and TP trunk 12/2023.     Admitted to vascular surgery 12/11 for critical limb ischemia on heparin  gtt after presenting to office with 4 day hx of sudden onset of right foot pain and coldness.  Taken to OR 12/12 for angiogram found to have thrombosis of RLE arterial stenting and underwent right peroneal, TP trunk, and popliteal artery angioplasty with EKOS.  Went to OR 12/13 for lysis check, underwent angioplasty to peroneal artery and stent placement of right superficial femoral artery.  Post op developed hematoma in left grown with subsequent loss of doppler signals and left groin and right calf pain with concern for developing compartment syndrome requiring return to OR.  Underwent RLE fasciotomy and left femoral endarterectomy also noted to have EKG- STD changes.  PCCM consulted for post procedural ICU medical management.     Pertinent  Medical History   Past Medical History:  Diagnosis Date   Anemia    Arthritis    lower back, hips, hands   Biliary stricture (HCC)    Diabetes mellitus (HCC)    type 2    Early cataracts, bilateral    Md just watching   Elevated liver enzymes    Family history of adverse reaction to anesthesia    Daughter hard to wake up   Gallstones    GERD (gastroesophageal reflux disease)    occasional - diet controlled   History of blood transfusion 2018    History of hiatal hernia    HTN (hypertension)    Hyperlipidemia    Hypothyroidism    left renal ca dx'd 2018   renal cancer - left kidney removed, pill chemo x 1 yr   Myocardial infarction (HCC) 1991   no deficits   Peripheral vascular disease    PONV (postoperative nausea and vomiting)    SVD (spontaneous vaginal delivery)    x 3   Wears glasses    Significant Hospital Events: Including procedures, antibiotic start and stop dates in addition to other pertinent events   12/12 Admit Vascular with right ischemic foot, heparin > OR > right peroneal, TP trunk, and popliteal artery angioplasty with EKOS  Interim History / Subjective:  No events. Pain under control. Started on amlodipine . Very weak  Objective    Blood pressure (!) 161/64, pulse 76, temperature 99 F (37.2 C), temperature source Oral, resp. rate 17, height 4' 9.99 (1.473 m), weight 54.4 kg, SpO2 98%.        Intake/Output Summary (Last 24 hours) at 04/05/2024 9287 Last data filed at 04/05/2024 0600 Gross per 24 hour  Intake 1995.05 ml  Output 1745 ml  Net 250.05 ml   Filed Weights   04/02/24 0852 04/03/24 0812  Weight: 54.4 kg 54.4 kg   Examination: Chronically ill Ongoing excoriation/irritation in groin region, foley in place to facilitate healing Lungs clear Aox3 Globally weak with profound LE muscle wasting MMM, trachea midline Heart regular L groin appears  stable  Echo normal Hgb responded to a unit  Resolved problem list   Assessment and Plan  Severe PAD with acute ischemic RLE  Compartment syndrome of RLE s/p fasciotomy Left groin hematoma status post endarterectomy-  Concern for mild rhabdo with compartment syndrome ST depression lateral leads likely reperfusion changes;  history of coronary artery. No CP.   Echo reassuring. UTI- received ancef  pre culture; no growth currently, on ceftriaxone  Uterine prolapse Severe groin excoriation/rash- acute on chronic, patient claims some sort of  psoriasis?  Just looks moisture associated Anemia- 1 unit given 12/14 with good response Hx HTN, HLD- resume home meds minus statin with rhabdo Chronic adrenal insufficiency- on PTA solucortef PO Poorly controlled diabetes with hyperglycemia, A1c 10.3 Hypothyroidism  -con't ceftriaxone  x 5 days (urine NGTD but did get some abx) - foley continues to facilitate skin healing; as mobility improves hopefully can come out - statin on hold with rhabdo - basal bolus insulin  - check TSH - Appreciate rehab MD evaluating for CIR - Continue topical therapy to groin excoriation - Stable for transfer out, appreciate TRH taking over 12/16 am  Rolan Sharps MD PCCM

## 2024-04-05 NOTE — Progress Notes (Signed)
 eLink Physician-Brief Progress Note Patient Name: [name] DOB: Apr 30, 1940 MRN: [id number]   Date of Service  04/05/2024  HPI/Events of Note  83 year old female, former smoker with a history of coronary artery disease/MI, adrenal insufficiency, and metastatic renal cell carcinoma currently in remission with PAD who initially presented with critical limb ischemia.  Developed some inferolateral ST depressions with T wave inversions.  Tropes unremarkable.  Echocardiogram reviewed.  Called about hypertension, able to take p.o.'s  eICU Interventions  Resume home amlodipine  5 mg     Intervention Category Intermediate Interventions: Hypotension - evaluation and management  Natausha Jungwirth 04/05/2024, 6:33 AM

## 2024-04-05 NOTE — Progress Notes (Addendum)
°  Progress Note    04/05/2024 8:41 AM 2 Days Post-Op  Subjective:  no complaints   Vitals:   04/05/24 0751 04/05/24 0824  BP:  (!) 150/62  Pulse:  84  Resp:    Temp: 98.9 F (37.2 C)   SpO2:     Physical Exam: Lungs:  non labored Incisions:  L groin c/d/I; fasciotomy incisions c/d/i Extremities:  R DP brisk by doppler, compartments soft; L peroneal by doppler Neurologic: A&O  CBC    Component Value Date/Time   WBC 9.5 04/05/2024 0354   RBC 3.17 (L) 04/05/2024 0354   HGB 9.7 (L) 04/05/2024 0354   HGB 12.6 12/03/2023 1359   HGB 11.2 (L) 04/04/2017 0830   HCT 28.6 (L) 04/05/2024 0354   HCT 35.2 04/04/2017 0830   PLT 161 04/05/2024 0354   PLT 233 12/03/2023 1359   PLT 250 04/04/2017 0830   MCV 90.2 04/05/2024 0354   MCV 107.6 (H) 04/04/2017 0830   MCH 30.6 04/05/2024 0354   MCHC 33.9 04/05/2024 0354   RDW 17.3 (H) 04/05/2024 0354   RDW 14.0 04/04/2017 0830   LYMPHSABS 1.0 12/03/2023 1359   LYMPHSABS 1.6 04/04/2017 0830   MONOABS 1.0 12/03/2023 1359   MONOABS 0.4 04/04/2017 0830   EOSABS 0.1 12/03/2023 1359   EOSABS 0.1 04/04/2017 0830   BASOSABS 0.1 12/03/2023 1359   BASOSABS 0.0 04/04/2017 0830    BMET    Component Value Date/Time   NA 139 04/05/2024 0354   NA 140 04/04/2017 0830   K 4.2 04/05/2024 0354   K 4.1 04/04/2017 0830   CL 113 (H) 04/05/2024 0354   CO2 18 (L) 04/05/2024 0354   CO2 21 (L) 04/04/2017 0830   GLUCOSE 195 (H) 04/05/2024 0354   GLUCOSE 107 04/04/2017 0830   BUN 29 (H) 04/05/2024 0354   BUN 37.0 (H) 04/04/2017 0830   CREATININE 1.67 (H) 04/05/2024 0354   CREATININE 1.67 (H) 12/03/2023 1359   CREATININE 2.2 (H) 04/04/2017 0830   CALCIUM  7.9 (L) 04/05/2024 0354   CALCIUM  10.1 04/04/2017 0830   GFRNONAA 30 (L) 04/05/2024 0354   GFRNONAA 31 (L) 12/03/2023 1359   GFRAA 30 (L) 01/19/2020 0835    INR    Component Value Date/Time   INR 1.1 09/01/2018 1850     Intake/Output Summary (Last 24 hours) at 04/05/2024 0841 Last  data filed at 04/05/2024 0600 Gross per 24 hour  Intake 1995.05 ml  Output 1745 ml  Net 250.05 ml     Assessment/Plan:  83 y.o. female is s/p follow-up lysis study with right peroneal artery angioplasty and right SFA stent. She required a return trip to the operating room for right leg compartment syndrome and loss of signals in the left leg. She underwent 4 compartment fasciotomy on the right and left iliofemoral endarterectomy with patch angioplasty  2 Days Post-Op   BLE warm and well perfused by doppler exam.  RLE compartments soft.  Incisions of RLE and L groin well appearing without hematoma.  Continue aspirin  and plavix .  H&H responded appropriately to pRBC transfusion.  Encouraged OOB with therapy teams.  Hopeful for CIR admission.  Ok to transfer to 4e from surgical standpoint.   Donnice Sender, PA-C [facility] [phone] 04/05/2024 8:41 AM  I have independently interviewed and examined patient and agree with PA assessment and plan above.   Jefferson Fullam C. Sheree, MD [facility] [place] Office: [phone] Pager: [phone]

## 2024-04-06 ENCOUNTER — Encounter (HOSPITAL_COMMUNITY): Payer: Self-pay | Admitting: Surgery

## 2024-04-06 ENCOUNTER — Other Ambulatory Visit: Payer: Self-pay

## 2024-04-06 ENCOUNTER — Inpatient Hospital Stay (HOSPITAL_COMMUNITY)
Admission: AD | Admit: 2024-04-06 | Discharge: 2024-04-17 | DRG: 945 | Disposition: A | Discharge status: Home Health | Source: Intra-hospital | Attending: Physical Medicine and Rehabilitation | Admitting: Physical Medicine and Rehabilitation

## 2024-04-06 DIAGNOSIS — I70235 Atherosclerosis of native arteries of right leg with ulceration of other part of foot: Secondary | ICD-10-CM | POA: Diagnosis present

## 2024-04-06 DIAGNOSIS — E1122 Type 2 diabetes mellitus with diabetic chronic kidney disease: Secondary | ICD-10-CM | POA: Diagnosis present

## 2024-04-06 DIAGNOSIS — Z6826 Body mass index (BMI) 26.0-26.9, adult: Secondary | ICD-10-CM

## 2024-04-06 DIAGNOSIS — R5381 Other malaise: Principal | ICD-10-CM | POA: Diagnosis present

## 2024-04-06 DIAGNOSIS — Z7982 Long term (current) use of aspirin: Secondary | ICD-10-CM

## 2024-04-06 DIAGNOSIS — N390 Urinary tract infection, site not specified: Secondary | ICD-10-CM | POA: Diagnosis present

## 2024-04-06 DIAGNOSIS — I129 Hypertensive chronic kidney disease with stage 1 through stage 4 chronic kidney disease, or unspecified chronic kidney disease: Secondary | ICD-10-CM | POA: Diagnosis present

## 2024-04-06 DIAGNOSIS — I70229 Atherosclerosis of native arteries of extremities with rest pain, unspecified extremity: Secondary | ICD-10-CM | POA: Diagnosis present

## 2024-04-06 DIAGNOSIS — I70221 Atherosclerosis of native arteries of extremities with rest pain, right leg: Secondary | ICD-10-CM | POA: Diagnosis present

## 2024-04-06 DIAGNOSIS — L89616 Pressure-induced deep tissue damage of right heel: Secondary | ICD-10-CM | POA: Diagnosis present

## 2024-04-06 DIAGNOSIS — N1832 Chronic kidney disease, stage 3b: Secondary | ICD-10-CM | POA: Diagnosis present

## 2024-04-06 DIAGNOSIS — Z794 Long term (current) use of insulin: Secondary | ICD-10-CM | POA: Diagnosis not present

## 2024-04-06 DIAGNOSIS — M6282 Rhabdomyolysis: Secondary | ICD-10-CM | POA: Diagnosis present

## 2024-04-06 DIAGNOSIS — L89156 Pressure-induced deep tissue damage of sacral region: Secondary | ICD-10-CM | POA: Diagnosis present

## 2024-04-06 DIAGNOSIS — L97919 Non-pressure chronic ulcer of unspecified part of right lower leg with unspecified severity: Secondary | ICD-10-CM | POA: Diagnosis present

## 2024-04-06 DIAGNOSIS — B353 Tinea pedis: Secondary | ICD-10-CM | POA: Diagnosis present

## 2024-04-06 DIAGNOSIS — Z905 Acquired absence of kidney: Secondary | ICD-10-CM

## 2024-04-06 DIAGNOSIS — L538 Other specified erythematous conditions: Secondary | ICD-10-CM | POA: Diagnosis present

## 2024-04-06 DIAGNOSIS — W19XXXA Unspecified fall, initial encounter: Secondary | ICD-10-CM | POA: Diagnosis not present

## 2024-04-06 DIAGNOSIS — E11621 Type 2 diabetes mellitus with foot ulcer: Secondary | ICD-10-CM | POA: Diagnosis present

## 2024-04-06 DIAGNOSIS — K219 Gastro-esophageal reflux disease without esophagitis: Secondary | ICD-10-CM | POA: Diagnosis present

## 2024-04-06 DIAGNOSIS — M79674 Pain in right toe(s): Secondary | ICD-10-CM | POA: Diagnosis not present

## 2024-04-06 DIAGNOSIS — E119 Type 2 diabetes mellitus without complications: Secondary | ICD-10-CM

## 2024-04-06 DIAGNOSIS — K5900 Constipation, unspecified: Secondary | ICD-10-CM | POA: Diagnosis not present

## 2024-04-06 DIAGNOSIS — Z66 Do not resuscitate: Secondary | ICD-10-CM | POA: Diagnosis present

## 2024-04-06 DIAGNOSIS — F54 Psychological and behavioral factors associated with disorders or diseases classified elsewhere: Secondary | ICD-10-CM | POA: Diagnosis not present

## 2024-04-06 DIAGNOSIS — E039 Hypothyroidism, unspecified: Secondary | ICD-10-CM | POA: Diagnosis present

## 2024-04-06 DIAGNOSIS — M7121 Synovial cyst of popliteal space [Baker], right knee: Secondary | ICD-10-CM | POA: Diagnosis present

## 2024-04-06 DIAGNOSIS — I743 Embolism and thrombosis of arteries of the lower extremities: Secondary | ICD-10-CM | POA: Diagnosis present

## 2024-04-06 DIAGNOSIS — Z85528 Personal history of other malignant neoplasm of kidney: Secondary | ICD-10-CM

## 2024-04-06 DIAGNOSIS — E1151 Type 2 diabetes mellitus with diabetic peripheral angiopathy without gangrene: Secondary | ICD-10-CM | POA: Diagnosis present

## 2024-04-06 DIAGNOSIS — Z881 Allergy status to other antibiotic agents status: Secondary | ICD-10-CM

## 2024-04-06 DIAGNOSIS — Z9889 Other specified postprocedural states: Secondary | ICD-10-CM

## 2024-04-06 DIAGNOSIS — K5901 Slow transit constipation: Secondary | ICD-10-CM | POA: Diagnosis not present

## 2024-04-06 DIAGNOSIS — R739 Hyperglycemia, unspecified: Secondary | ICD-10-CM | POA: Diagnosis not present

## 2024-04-06 DIAGNOSIS — E1142 Type 2 diabetes mellitus with diabetic polyneuropathy: Secondary | ICD-10-CM | POA: Diagnosis present

## 2024-04-06 DIAGNOSIS — E785 Hyperlipidemia, unspecified: Secondary | ICD-10-CM | POA: Diagnosis present

## 2024-04-06 DIAGNOSIS — N3000 Acute cystitis without hematuria: Secondary | ICD-10-CM | POA: Diagnosis not present

## 2024-04-06 DIAGNOSIS — Z95828 Presence of other vascular implants and grafts: Secondary | ICD-10-CM

## 2024-04-06 DIAGNOSIS — N3281 Overactive bladder: Secondary | ICD-10-CM | POA: Diagnosis present

## 2024-04-06 DIAGNOSIS — R001 Bradycardia, unspecified: Secondary | ICD-10-CM | POA: Diagnosis present

## 2024-04-06 DIAGNOSIS — I2510 Atherosclerotic heart disease of native coronary artery without angina pectoris: Secondary | ICD-10-CM | POA: Diagnosis present

## 2024-04-06 DIAGNOSIS — Y92230 Patient room in hospital as the place of occurrence of the external cause: Secondary | ICD-10-CM | POA: Diagnosis not present

## 2024-04-06 DIAGNOSIS — I10 Essential (primary) hypertension: Secondary | ICD-10-CM | POA: Diagnosis not present

## 2024-04-06 DIAGNOSIS — B379 Candidiasis, unspecified: Secondary | ICD-10-CM | POA: Diagnosis not present

## 2024-04-06 DIAGNOSIS — Z8249 Family history of ischemic heart disease and other diseases of the circulatory system: Secondary | ICD-10-CM

## 2024-04-06 DIAGNOSIS — N179 Acute kidney failure, unspecified: Secondary | ICD-10-CM | POA: Diagnosis not present

## 2024-04-06 DIAGNOSIS — Z9221 Personal history of antineoplastic chemotherapy: Secondary | ICD-10-CM

## 2024-04-06 DIAGNOSIS — I252 Old myocardial infarction: Secondary | ICD-10-CM

## 2024-04-06 DIAGNOSIS — E559 Vitamin D deficiency, unspecified: Secondary | ICD-10-CM | POA: Diagnosis present

## 2024-04-06 DIAGNOSIS — I739 Peripheral vascular disease, unspecified: Secondary | ICD-10-CM | POA: Diagnosis present

## 2024-04-06 DIAGNOSIS — E663 Overweight: Secondary | ICD-10-CM | POA: Diagnosis present

## 2024-04-06 DIAGNOSIS — Z7902 Long term (current) use of antithrombotics/antiplatelets: Secondary | ICD-10-CM | POA: Diagnosis not present

## 2024-04-06 DIAGNOSIS — L89626 Pressure-induced deep tissue damage of left heel: Secondary | ICD-10-CM | POA: Diagnosis present

## 2024-04-06 DIAGNOSIS — M79661 Pain in right lower leg: Secondary | ICD-10-CM | POA: Diagnosis present

## 2024-04-06 DIAGNOSIS — E875 Hyperkalemia: Secondary | ICD-10-CM | POA: Diagnosis present

## 2024-04-06 DIAGNOSIS — R300 Dysuria: Secondary | ICD-10-CM | POA: Diagnosis present

## 2024-04-06 DIAGNOSIS — Z79890 Hormone replacement therapy (postmenopausal): Secondary | ICD-10-CM

## 2024-04-06 DIAGNOSIS — M25561 Pain in right knee: Secondary | ICD-10-CM | POA: Diagnosis present

## 2024-04-06 DIAGNOSIS — Z79899 Other long term (current) drug therapy: Secondary | ICD-10-CM

## 2024-04-06 DIAGNOSIS — I959 Hypotension, unspecified: Secondary | ICD-10-CM | POA: Diagnosis present

## 2024-04-06 DIAGNOSIS — Z87891 Personal history of nicotine dependence: Secondary | ICD-10-CM

## 2024-04-06 DIAGNOSIS — Z888 Allergy status to other drugs, medicaments and biological substances status: Secondary | ICD-10-CM

## 2024-04-06 DIAGNOSIS — L03115 Cellulitis of right lower limb: Secondary | ICD-10-CM | POA: Diagnosis present

## 2024-04-06 LAB — BASIC METABOLIC PANEL WITH GFR
Anion gap: 9 (ref 5–15)
BUN: 24 mg/dL — ABNORMAL HIGH (ref 8–23)
CO2: 26 mmol/L (ref 22–32)
Calcium: 7.8 mg/dL — ABNORMAL LOW (ref 8.9–10.3)
Chloride: 104 mmol/L (ref 98–111)
Creatinine, Ser: 1.5 mg/dL — ABNORMAL HIGH (ref 0.44–1.00)
GFR, Estimated: 35 mL/min — ABNORMAL LOW (ref >60)
Glucose, Bld: 159 mg/dL — ABNORMAL HIGH (ref 70–99)
Potassium: 3.8 mmol/L (ref 3.5–5.1)
Sodium: 139 mmol/L (ref 135–145)

## 2024-04-06 LAB — CBC
HCT: 25.8 % — ABNORMAL LOW (ref 36.0–46.0)
Hemoglobin: 8.7 g/dL — ABNORMAL LOW (ref 12.0–15.0)
MCH: 30.1 pg (ref 26.0–34.0)
MCHC: 33.7 g/dL (ref 30.0–36.0)
MCV: 89.3 fL (ref 80.0–100.0)
Platelets: 165 K/uL (ref 150–400)
RBC: 2.89 MIL/uL — ABNORMAL LOW (ref 3.87–5.11)
RDW: 16.2 % — ABNORMAL HIGH (ref 11.5–15.5)
WBC: 9 K/uL (ref 4.0–10.5)
nRBC: 0 % (ref 0.0–0.2)

## 2024-04-06 LAB — GLUCOSE, CAPILLARY
Glucose-Capillary: 111 mg/dL — ABNORMAL HIGH (ref 70–99)
Glucose-Capillary: 146 mg/dL — ABNORMAL HIGH (ref 70–99)
Glucose-Capillary: 245 mg/dL — ABNORMAL HIGH (ref 70–99)
Glucose-Capillary: 295 mg/dL — ABNORMAL HIGH (ref 70–99)
Glucose-Capillary: 318 mg/dL — ABNORMAL HIGH (ref 70–99)

## 2024-04-06 LAB — CK: Total CK: 903 U/L — ABNORMAL HIGH (ref 38–234)

## 2024-04-06 MED ORDER — SODIUM CHLORIDE 0.9% FLUSH
10.0000 mL | Freq: Two times a day (BID) | INTRAVENOUS | Status: DC
  Administered 2024-04-06: 01:00:00 10 mL

## 2024-04-06 MED ORDER — CLOPIDOGREL BISULFATE 75 MG PO TABS
75.0000 mg | ORAL_TABLET | Freq: Every day | ORAL | Status: DC
  Administered 2024-04-06 – 2024-04-16 (×11): 75 mg via ORAL
  Filled 2024-04-06 (×11): qty 1

## 2024-04-06 MED ORDER — INSULIN ASPART 100 UNIT/ML IJ SOLN: 0.0000 [IU] | INTRAMUSCULAR | Status: AC

## 2024-04-06 MED ORDER — PHENOL 1.4 % MT LIQD: 1.0000 | OROMUCOSAL | Status: DC | PRN

## 2024-04-06 MED ORDER — ACETAMINOPHEN 650 MG RE SUPP: 325.0000 mg | RECTAL | Status: DC | PRN

## 2024-04-06 MED ORDER — SODIUM CHLORIDE 0.9% FLUSH
10.0000 mL | Freq: Two times a day (BID) | INTRAVENOUS | Status: DC
  Administered 2024-04-07 – 2024-04-16 (×14): 10 mL

## 2024-04-06 MED ORDER — INSULIN GLARGINE 100 UNIT/ML ~~LOC~~ SOLN: 5.0000 [IU] | Freq: Two times a day (BID) | SUBCUTANEOUS | Status: DC

## 2024-04-06 MED ORDER — DICLOFENAC SODIUM 1 % EX GEL: 2.0000 g | Freq: Four times a day (QID) | CUTANEOUS | Status: DC | PRN

## 2024-04-06 MED ORDER — AMLODIPINE BESYLATE 5 MG PO TABS
5.0000 mg | ORAL_TABLET | Freq: Every day | ORAL | Status: DC
  Administered 2024-04-07 – 2024-04-09 (×3): 5 mg via ORAL
  Filled 2024-04-06 (×3): qty 1

## 2024-04-06 MED ORDER — HYDROCORTISONE 10 MG PO TABS: 10.0000 mg | ORAL_TABLET | Freq: Every day | ORAL | Status: AC

## 2024-04-06 MED ORDER — SODIUM CHLORIDE 0.9% FLUSH: 10.0000 mL | INTRAVENOUS | Status: DC | PRN

## 2024-04-06 MED ORDER — DICLOFENAC SODIUM 1 % EX GEL
2.0000 g | Freq: Four times a day (QID) | CUTANEOUS | Status: DC | PRN
  Filled 2024-04-06: qty 100

## 2024-04-06 MED ORDER — HYDROCORTISONE 5 MG PO TABS: 5.0000 mg | ORAL_TABLET | Freq: Every day | ORAL | Status: DC

## 2024-04-06 MED ORDER — HYDROCORTISONE 10 MG PO TABS
10.0000 mg | ORAL_TABLET | Freq: Every day | ORAL | Status: DC
  Administered 2024-04-07 – 2024-04-17 (×11): 10 mg via ORAL
  Filled 2024-04-06 (×11): qty 1

## 2024-04-06 MED ORDER — GERHARDT'S BUTT CREAM
TOPICAL_CREAM | Freq: Two times a day (BID) | CUTANEOUS | Status: DC
  Administered 2024-04-07 – 2024-04-16 (×2): 1 via TOPICAL
  Filled 2024-04-06: qty 60

## 2024-04-06 MED ORDER — HYDROCORTISONE 10 MG PO TABS
5.0000 mg | ORAL_TABLET | Freq: Every day | ORAL | Status: DC
  Administered 2024-04-06 – 2024-04-16 (×11): 5 mg via ORAL
  Filled 2024-04-06 (×3): qty 1
  Filled 2024-04-06: qty 0.5
  Filled 2024-04-06 (×3): qty 1
  Filled 2024-04-06: qty 0.5
  Filled 2024-04-06 (×2): qty 1
  Filled 2024-04-06 (×2): qty 0.5

## 2024-04-06 MED ORDER — ASPIRIN 81 MG PO TBEC
81.0000 mg | DELAYED_RELEASE_TABLET | Freq: Every day | ORAL | Status: DC
  Administered 2024-04-07 – 2024-04-17 (×11): 81 mg via ORAL
  Filled 2024-04-06 (×11): qty 1

## 2024-04-06 MED ORDER — SENNOSIDES-DOCUSATE SODIUM 8.6-50 MG PO TABS
1.0000 | ORAL_TABLET | Freq: Every day | ORAL | Status: DC
  Administered 2024-04-06 – 2024-04-16 (×11): 1 via ORAL
  Filled 2024-04-06 (×12): qty 1

## 2024-04-06 MED ORDER — HEPARIN SODIUM (PORCINE) 5000 UNIT/ML IJ SOLN
5000.0000 [IU] | Freq: Three times a day (TID) | INTRAMUSCULAR | Status: DC
  Administered 2024-04-06 – 2024-04-08 (×5): 5000 [IU] via SUBCUTANEOUS
  Filled 2024-04-06 (×5): qty 1

## 2024-04-06 MED ORDER — CARVEDILOL 6.25 MG PO TABS
6.2500 mg | ORAL_TABLET | Freq: Two times a day (BID) | ORAL | Status: DC
  Administered 2024-04-06 – 2024-04-14 (×16): 6.25 mg via ORAL
  Filled 2024-04-06 (×16): qty 1

## 2024-04-06 MED ORDER — LEVOTHYROXINE SODIUM 75 MCG PO TABS
75.0000 ug | ORAL_TABLET | Freq: Every day | ORAL | Status: DC
  Administered 2024-04-07 – 2024-04-17 (×11): 75 ug via ORAL
  Filled 2024-04-06 (×11): qty 1

## 2024-04-06 MED ORDER — OXYCODONE HCL 5 MG PO TABS
10.0000 mg | ORAL_TABLET | ORAL | Status: DC | PRN
  Administered 2024-04-06 – 2024-04-16 (×23): 10 mg via ORAL
  Filled 2024-04-06 (×23): qty 2

## 2024-04-06 MED ORDER — CLOBETASOL PROPIONATE 0.05 % EX CREA
TOPICAL_CREAM | Freq: Two times a day (BID) | CUTANEOUS | Status: DC
  Administered 2024-04-07 – 2024-04-16 (×2): 1 via TOPICAL
  Filled 2024-04-06: qty 15

## 2024-04-06 MED ORDER — HEPARIN SODIUM (PORCINE) 5000 UNIT/ML IJ SOLN: 5000.0000 [IU] | Freq: Three times a day (TID) | INTRAMUSCULAR | Status: DC

## 2024-04-06 MED ORDER — MELATONIN 3 MG PO TABS: 3.0000 mg | ORAL_TABLET | Freq: Every day | ORAL | Status: DC

## 2024-04-06 MED ORDER — SODIUM CHLORIDE 0.9 % IV SOLN
1.0000 g | INTRAVENOUS | Status: AC
  Administered 2024-04-06 – 2024-04-07 (×2): 1 g via INTRAVENOUS
  Filled 2024-04-06 (×2): qty 10

## 2024-04-06 MED ORDER — INSULIN ASPART 100 UNIT/ML IJ SOLN: 0.0000 [IU] | INTRAMUSCULAR | Status: DC

## 2024-04-06 MED ORDER — OXYCODONE HCL 10 MG PO TABS: 10.0000 mg | ORAL_TABLET | ORAL | Status: DC | PRN

## 2024-04-06 MED ORDER — INSULIN GLARGINE 100 UNIT/ML ~~LOC~~ SOLN
5.0000 [IU] | Freq: Two times a day (BID) | SUBCUTANEOUS | Status: DC
  Administered 2024-04-06 – 2024-04-07 (×2): 5 [IU] via SUBCUTANEOUS
  Filled 2024-04-06 (×3): qty 0.05

## 2024-04-06 MED ORDER — ACETAMINOPHEN 325 MG PO TABS
325.0000 mg | ORAL_TABLET | ORAL | Status: DC | PRN
  Administered 2024-04-07 – 2024-04-11 (×5): 650 mg via ORAL
  Administered 2024-04-14: 500 mg via ORAL
  Filled 2024-04-06 (×7): qty 2

## 2024-04-06 MED ORDER — DICLOFENAC SODIUM 1 % EX GEL
2.0000 g | Freq: Four times a day (QID) | CUTANEOUS | Status: DC | PRN
  Administered 2024-04-11 – 2024-04-16 (×3): 2 g via TOPICAL

## 2024-04-06 MED ORDER — LIVING WELL WITH DIABETES BOOK
Freq: Once | Status: AC
  Filled 2024-04-06: qty 1

## 2024-04-06 MED ORDER — INSULIN ASPART 100 UNIT/ML IJ SOLN
0.0000 [IU] | Freq: Three times a day (TID) | INTRAMUSCULAR | Status: DC
  Administered 2024-04-06: 21:00:00 5 [IU] via SUBCUTANEOUS
  Administered 2024-04-06: 18:00:00 7 [IU] via SUBCUTANEOUS
  Administered 2024-04-07: 07:00:00 1 [IU] via SUBCUTANEOUS
  Administered 2024-04-07: 12:00:00 2 [IU] via SUBCUTANEOUS
  Administered 2024-04-07: 17:00:00 3 [IU] via SUBCUTANEOUS
  Administered 2024-04-07: 22:00:00 5 [IU] via SUBCUTANEOUS
  Administered 2024-04-08: 22:00:00 1 [IU] via SUBCUTANEOUS
  Administered 2024-04-08: 18:00:00 3 [IU] via SUBCUTANEOUS
  Administered 2024-04-08: 07:00:00 1 [IU] via SUBCUTANEOUS
  Administered 2024-04-08 – 2024-04-09 (×2): 3 [IU] via SUBCUTANEOUS
  Administered 2024-04-09: 9 [IU] via SUBCUTANEOUS
  Administered 2024-04-09 (×2): 2 [IU] via SUBCUTANEOUS
  Administered 2024-04-10: 3 [IU] via SUBCUTANEOUS
  Administered 2024-04-10 – 2024-04-11 (×4): 2 [IU] via SUBCUTANEOUS
  Administered 2024-04-11: 3 [IU] via SUBCUTANEOUS
  Administered 2024-04-11: 5 [IU] via SUBCUTANEOUS
  Administered 2024-04-11 – 2024-04-12 (×3): 2 [IU] via SUBCUTANEOUS
  Administered 2024-04-12: 5 [IU] via SUBCUTANEOUS
  Administered 2024-04-13: 3 [IU] via SUBCUTANEOUS
  Administered 2024-04-13: 2 [IU] via SUBCUTANEOUS
  Administered 2024-04-13: 3 [IU] via SUBCUTANEOUS
  Administered 2024-04-13: 1 [IU] via SUBCUTANEOUS
  Administered 2024-04-14 (×2): 2 [IU] via SUBCUTANEOUS
  Administered 2024-04-14 (×2): 3 [IU] via SUBCUTANEOUS
  Administered 2024-04-15: 2 [IU] via SUBCUTANEOUS
  Administered 2024-04-15: 3 [IU] via SUBCUTANEOUS
  Administered 2024-04-15: 2 [IU] via SUBCUTANEOUS
  Administered 2024-04-15: 3 [IU] via SUBCUTANEOUS
  Administered 2024-04-16: 5 [IU] via SUBCUTANEOUS
  Administered 2024-04-16 (×2): 1 [IU] via SUBCUTANEOUS
  Filled 2024-04-06 (×2): qty 1
  Filled 2024-04-06 (×3): qty 5
  Filled 2024-04-06: qty 3
  Filled 2024-04-06: qty 2
  Filled 2024-04-06: qty 3
  Filled 2024-04-06: qty 7
  Filled 2024-04-06: qty 3
  Filled 2024-04-06: qty 5
  Filled 2024-04-06 (×4): qty 2
  Filled 2024-04-06: qty 3
  Filled 2024-04-06: qty 2
  Filled 2024-04-06: qty 1
  Filled 2024-04-06: qty 2
  Filled 2024-04-06: qty 3
  Filled 2024-04-06: qty 2
  Filled 2024-04-06 (×3): qty 3
  Filled 2024-04-06: qty 2
  Filled 2024-04-06: qty 1
  Filled 2024-04-06: qty 5
  Filled 2024-04-06: qty 1
  Filled 2024-04-06: qty 2
  Filled 2024-04-06: qty 3
  Filled 2024-04-06: qty 1
  Filled 2024-04-06: qty 9
  Filled 2024-04-06 (×2): qty 2
  Filled 2024-04-06: qty 9
  Filled 2024-04-06: qty 7
  Filled 2024-04-06: qty 2
  Filled 2024-04-06: qty 3
  Filled 2024-04-06: qty 2
  Filled 2024-04-06 (×2): qty 3

## 2024-04-06 MED ORDER — ONDANSETRON HCL 4 MG/2ML IJ SOLN: 4.0000 mg | Freq: Four times a day (QID) | INTRAMUSCULAR | Status: DC | PRN

## 2024-04-06 MED ORDER — SODIUM CHLORIDE 0.9 % IV SOLN: 1.0000 g | INTRAVENOUS | Status: DC

## 2024-04-06 MED ORDER — ACETAMINOPHEN 325 MG PO TABS: 325.0000 mg | ORAL_TABLET | ORAL | Status: DC | PRN

## 2024-04-06 MED ORDER — MAGNESIUM HYDROXIDE 400 MG/5ML PO SUSP: 5.0000 mL | Freq: Every evening | ORAL | Status: DC | PRN

## 2024-04-06 MED ORDER — CHLORHEXIDINE GLUCONATE CLOTH 2 % EX PADS
6.0000 | MEDICATED_PAD | Freq: Every day | CUTANEOUS | Status: DC
  Administered 2024-04-06 – 2024-04-08 (×2): 6 via TOPICAL

## 2024-04-06 MED ORDER — POLYETHYLENE GLYCOL 3350 17 G PO PACK
17.0000 g | PACK | Freq: Two times a day (BID) | ORAL | Status: DC
  Administered 2024-04-06 – 2024-04-08 (×3): 17 g via ORAL
  Filled 2024-04-06 (×4): qty 1

## 2024-04-06 MED ORDER — GERHARDT'S BUTT CREAM: 1.0000 | TOPICAL_CREAM | Freq: Two times a day (BID) | CUTANEOUS | Status: DC

## 2024-04-06 MED ORDER — MELATONIN 3 MG PO TABS
3.0000 mg | ORAL_TABLET | Freq: Every day | ORAL | Status: DC
  Administered 2024-04-06 – 2024-04-16 (×11): 3 mg via ORAL
  Filled 2024-04-06 (×11): qty 1

## 2024-04-06 NOTE — Progress Notes (Addendum)
°  Progress Note    04/06/2024 9:05 AM 3 Days Post-Op  Subjective:  no complaints   Vitals:   04/06/24 0612 04/06/24 0735  BP: (!) 149/61 (!) 152/60  Pulse: 64 62  Resp:    Temp:    SpO2:  100%   Physical Exam: Lungs:  non labored Incisions:  L groin c/d/I; dressings left in place R lower leg Extremities:  feet are warm with good cap refill Neurologic: a&O  CBC    Component Value Date/Time   WBC 9.0 04/06/2024 0058   RBC 2.89 (L) 04/06/2024 0058   HGB 8.7 (L) 04/06/2024 0058   HGB 12.6 12/03/2023 1359   HGB 11.2 (L) 04/04/2017 0830   HCT 25.8 (L) 04/06/2024 0058   HCT 35.2 04/04/2017 0830   PLT 165 04/06/2024 0058   PLT 233 12/03/2023 1359   PLT 250 04/04/2017 0830   MCV 89.3 04/06/2024 0058   MCV 107.6 (H) 04/04/2017 0830   MCH 30.1 04/06/2024 0058   MCHC 33.7 04/06/2024 0058   RDW 16.2 (H) 04/06/2024 0058   RDW 14.0 04/04/2017 0830   LYMPHSABS 1.0 12/03/2023 1359   LYMPHSABS 1.6 04/04/2017 0830   MONOABS 1.0 12/03/2023 1359   MONOABS 0.4 04/04/2017 0830   EOSABS 0.1 12/03/2023 1359   EOSABS 0.1 04/04/2017 0830   BASOSABS 0.1 12/03/2023 1359   BASOSABS 0.0 04/04/2017 0830    BMET    Component Value Date/Time   NA 139 04/06/2024 0058   NA 140 04/04/2017 0830   K 3.8 04/06/2024 0058   K 4.1 04/04/2017 0830   CL 104 04/06/2024 0058   CO2 26 04/06/2024 0058   CO2 21 (L) 04/04/2017 0830   GLUCOSE 159 (H) 04/06/2024 0058   GLUCOSE 107 04/04/2017 0830   BUN 24 (H) 04/06/2024 0058   BUN 37.0 (H) 04/04/2017 0830   CREATININE 1.50 (H) 04/06/2024 0058   CREATININE 1.67 (H) 12/03/2023 1359   CREATININE 2.2 (H) 04/04/2017 0830   CALCIUM  7.8 (L) 04/06/2024 0058   CALCIUM  10.1 04/04/2017 0830   GFRNONAA 35 (L) 04/06/2024 0058   GFRNONAA 31 (L) 12/03/2023 1359   GFRAA 30 (L) 01/19/2020 0835    INR    Component Value Date/Time   INR 1.1 09/01/2018 1850     Intake/Output Summary (Last 24 hours) at 04/06/2024 0905 Last data filed at 04/06/2024  0125 Gross per 24 hour  Intake 954.02 ml  Output 755 ml  Net 199.02 ml     Assessment/Plan:  83 y.o. female is s/p  follow-up lysis study with right peroneal artery angioplasty and right SFA stent. She required a return trip to the operating room for right leg compartment syndrome and loss of signals in the left leg. She underwent 4 compartment fasciotomy on the right and left iliofemoral endarterectomy with patch angioplasty  3 Days Post-Op   BLE warm and well perfused on exam.  Exam limited due to lack of doppler.  L groin incision without hematoma.  Daily dressing changes to fasciotomies and L groin.  OOB with therapy teams.  Hopeful for CIR.  If not discharging to Cir, please transfer to 4e.   Audrey Sender, PA-C [facility] [phone] 04/06/2024 9:05 AM  I agree with the above Plan for ASA and Plavix  -d/c foley  Audrey [name]

## 2024-04-06 NOTE — Progress Notes (Signed)
 Patient ID: Audrey [name], female   DOB: [birth year], 83 y.o.   MRN: [id number] Met with the patient to review the current medical situation, rehab process, team conference and plan of care. Discussed skin care, toileting and dietary modification for HH/CMM diet. Discussed secondary risk including DM (A1C 10.3) and medications. Continue to follow along to address educational needs to facilitate preparation for discharge. Audrey [name]

## 2024-04-06 NOTE — Progress Notes (Signed)
 Charge Nurse called reporting patient had a fall, nurse was in the room. He report patient did not hit her head, fall precautions will be followed.

## 2024-04-06 NOTE — H&P (Signed)
 Physical Medicine and Rehabilitation Admission H&P     Chief complaint : Critical limb ischemia   HPI: Audrey [name] is an 83 year old R handed female with PMHx of DM II, GERD, HTN, HLD, hypothyroidism, CAD/MI, adrenal insufficiency, metastatic renal cell carcinoma (adrenal, bone, pulmonary s/p chemo, left nephrectomy and left adrenal resection 2018) currently in remission, PAD, right SFA stenting (10/2022), atherectomy and stenting of right SFA, popliteal and TP trunk (12/29/23).  The patient presented to [facility] on 04/01/2024 for a planned admission by [facility] for critical limb ischemia on heparin  gtt. after presenting to the office with 4-day history of sudden onset of right foot pain and coldness.  She was taken to the OR on 12/12 for angiogram and found to have thrombosis of the right SFA stents by Dr. Magda.  The patient underwent laser arthrectomy right peroneal, TP trunk and popliteal artery angioplasty with EKOS.     On 12/13 patient went to the OR for lysis check and underwent angioplasty to peroneal artery and stent placement of right SFA by Dr. Serene. Postoperatively the patient developed hematoma in the left groin with subsequent loss of doppler signals. Patient endorsed left groin and right calf pain. Due to concerns for compartment syndrome, patient returned to OR for a 4 compartment fasciotomy of the right leg, left iliofemoral endarterectomy with pericardial patch angioplasty and left leg angiogram. Aspirin  and Plavix  initiated on 12/14.  Hospital course complicated by mild rhabdomyolysis and anemia that responded to IV fluid and PRBC transfusion. She completed 5 days therapy of antibiotics for UTI.     Prior to arrival the patient was active and independent in the community with use of DME.  She currently lives alone in a two-level house but able to live on the main level.  She currently requires min to mod assist with mobility and basic ADLs. Therapy evaluations completed  due to patient decreased functional mobility was admitted for a comprehensive rehab program.   Pt reports Foley was removed- but having some urgency to void.  Pain 6-7/10 at rest and with movement gets to ~9/10. LBM this AM-  Notes she lives alone and daughter can only stay 1 week but per admissions coordinator can also go stay with daughter, although she doesn't like daughter's cats- is not allergic fyi.      Review of Systems  Constitutional:  Positive for malaise/fatigue.  HENT: Negative.    Eyes: Negative.   Respiratory: Negative.    Cardiovascular:  Positive for leg swelling. Negative for chest pain.  Gastrointestinal:  Negative for constipation, diarrhea and nausea.  Genitourinary:  Positive for frequency and urgency.  Musculoskeletal:  Positive for joint pain and myalgias.  Skin: Negative.   Neurological:  Positive for tingling and focal weakness.  Endo/Heme/Allergies: Negative.   Psychiatric/Behavioral:  The patient has insomnia.   All other systems reviewed and are negative.                Past Medical History:  Diagnosis Date   Anemia     Arthritis      lower back, hips, hands   Biliary stricture (HCC)     Diabetes mellitus (HCC)      type 2    Early cataracts, bilateral      Md just watching   Elevated liver enzymes     Family history of adverse reaction to anesthesia      Daughter hard to wake up   Gallstones  GERD (gastroesophageal reflux disease)      occasional - diet controlled   History of blood transfusion 2018   History of hiatal hernia     HTN (hypertension)     Hyperlipidemia     Hypothyroidism     left renal ca dx'd 2018    renal cancer - left kidney removed, pill chemo x 1 yr   Myocardial infarction (HCC) 1991    no deficits   Peripheral vascular disease     PONV (postoperative nausea and vomiting)     SVD (spontaneous vaginal delivery)      x 3   Wears glasses               Past Surgical History:  Procedure Laterality Date    ABDOMINAL AORTOGRAM N/A 12/29/2023    Procedure: ABDOMINAL AORTOGRAM;  Surgeon: Sheree Penne Bruckner, MD;  Location: [facility] INVASIVE CV LAB;  Service: Cardiovascular;  Laterality: N/A;   ABDOMINAL AORTOGRAM W/LOWER EXTREMITY N/A 10/21/2022    Procedure: ABDOMINAL AORTOGRAM W/LOWER EXTREMITY;  Surgeon: Sheree Penne Bruckner, MD;  Location: [facility] INVASIVE CV LAB;  Service: Cardiovascular;  Laterality: N/A;   ABDOMINAL AORTOGRAM W/LOWER EXTREMITY N/A 04/02/2024    Procedure: ABDOMINAL AORTOGRAM W/LOWER EXTREMITY;  Surgeon: Magda Debby SAILOR, MD;  Location: [facility] INVASIVE CV LAB;  Service: Cardiovascular;  Laterality: N/A;   BALLOON DILATION N/A 07/23/2018    Procedure: BALLOON DILATION;  Surgeon: Wilhelmenia Aloha Raddle., MD;  Location: [facility] ENDOSCOPY;  Service: Gastroenterology;  Laterality: N/A;   BILIARY BRUSHING   08/10/2018    Procedure: BILIARY BRUSHING;  Surgeon: Wilhelmenia Aloha Raddle., MD;  Location: [facility] ENDOSCOPY;  Service: Gastroenterology;;   BILIARY BRUSHING   11/30/2018    Procedure: BILIARY BRUSHING;  Surgeon: Wilhelmenia Aloha Raddle., MD;  Location: [facility] ENDOSCOPY;  Service: Gastroenterology;;   BILIARY DILATION   08/10/2018    Procedure: BILIARY DILATION;  Surgeon: Wilhelmenia Aloha Raddle., MD;  Location: [facility] ENDOSCOPY;  Service: Gastroenterology;;   BILIARY DILATION   08/27/2018    Procedure: BILIARY DILATION;  Surgeon: Wilhelmenia Aloha Raddle., MD;  Location: [facility] ENDOSCOPY;  Service: Gastroenterology;;   BILIARY DILATION   01/24/2020    Procedure: BILIARY DILATION;  Surgeon: Wilhelmenia Aloha Raddle., MD;  Location: [facility] ENDOSCOPY;  Service: Gastroenterology;;   BILIARY STENT PLACEMENT   08/10/2018    Procedure: BILIARY STENT PLACEMENT;  Surgeon: Wilhelmenia Aloha Raddle., MD;  Location: [facility] ENDOSCOPY;  Service: Gastroenterology;;   BILIARY STENT PLACEMENT   08/27/2018    Procedure: BILIARY STENT PLACEMENT;  Surgeon: Wilhelmenia Aloha Raddle., MD;  Location: [facility] ENDOSCOPY;  Service: Gastroenterology;;   BILIARY  STENT PLACEMENT   11/30/2018    Procedure: BILIARY STENT PLACEMENT;  Surgeon: Wilhelmenia Aloha Raddle., MD;  Location: [facility] ENDOSCOPY;  Service: Gastroenterology;;   BIOPSY   07/23/2018    Procedure: BIOPSY;  Surgeon: Wilhelmenia Aloha Raddle., MD;  Location: [facility] ENDOSCOPY;  Service: Gastroenterology;;   BIOPSY   01/24/2020    Procedure: BIOPSY;  Surgeon: Wilhelmenia Aloha Raddle., MD;  Location: [facility] ENDOSCOPY;  Service: Gastroenterology;;   CHOLECYSTECTOMY N/A 09/02/2018    Procedure: LAPAROSCOPIC CHOLECYSTECTOMY;  Surgeon: Aron Shoulders, MD;  Location: [facility] OR;  Service: General;  Laterality: N/A;   COLONOSCOPY        normal    ENDARTERECTOMY FEMORAL Left 04/03/2024    Procedure: ENDARTERECTOMY, FEMORAL;  Surgeon: Serene Gaile ORN, MD;  Location: [facility] OR;  Service: Vascular;  Laterality: Left;   ENDOSCOPIC RETROGRADE CHOLANGIOPANCREATOGRAPHY (ERCP) WITH PROPOFOL  N/A 08/10/2018    Procedure:  ENDOSCOPIC RETROGRADE CHOLANGIOPANCREATOGRAPHY (ERCP) WITH PROPOFOL ;  Surgeon: Wilhelmenia Aloha Raddle., MD;  Location: [facility] ENDOSCOPY;  Service: Gastroenterology;  Laterality: N/A;   ENDOSCOPIC RETROGRADE CHOLANGIOPANCREATOGRAPHY (ERCP) WITH PROPOFOL  N/A 11/30/2018    Procedure: ENDOSCOPIC RETROGRADE CHOLANGIOPANCREATOGRAPHY (ERCP) WITH PROPOFOL ;  Surgeon: Wilhelmenia Aloha Raddle., MD;  Location: [facility] ENDOSCOPY;  Service: Gastroenterology;  Laterality: N/A;   ENDOSCOPIC RETROGRADE CHOLANGIOPANCREATOGRAPHY (ERCP) WITH PROPOFOL  N/A 01/24/2020    Procedure: ENDOSCOPIC RETROGRADE CHOLANGIOPANCREATOGRAPHY (ERCP) WITH PROPOFOL ;  Surgeon: Wilhelmenia Aloha Raddle., MD;  Location: [facility] ENDOSCOPY;  Service: Gastroenterology;  Laterality: N/A;   ERCP N/A 07/23/2018    Procedure: ENDOSCOPIC RETROGRADE CHOLANGIOPANCREATOGRAPHY (ERCP);  Surgeon: Wilhelmenia Aloha Raddle., MD;  Location: [facility] ENDOSCOPY;  Service: Gastroenterology;  Laterality: N/A;   ERCP N/A 08/27/2018    Procedure: ENDOSCOPIC RETROGRADE CHOLANGIOPANCREATOGRAPHY (ERCP);  Surgeon:  Wilhelmenia Aloha Raddle., MD;  Location: [facility] ENDOSCOPY;  Service: Gastroenterology;  Laterality: N/A;   ESOPHAGOGASTRODUODENOSCOPY N/A 01/24/2020    Procedure: ESOPHAGOGASTRODUODENOSCOPY (EGD);  Surgeon: Wilhelmenia Aloha Raddle., MD;  Location: [facility] ENDOSCOPY;  Service: Gastroenterology;  Laterality: N/A;   ESOPHAGOGASTRODUODENOSCOPY (EGD) WITH PROPOFOL  N/A 08/27/2018    Procedure: ESOPHAGOGASTRODUODENOSCOPY (EGD) WITH PROPOFOL ;  Surgeon: Wilhelmenia Aloha Raddle., MD;  Location: [facility] ENDOSCOPY;  Service: Gastroenterology;  Laterality: N/A;   EUS N/A 08/27/2018    Procedure: ESOPHAGEAL ENDOSCOPIC ULTRASOUND (EUS) RADIAL;  Surgeon: Wilhelmenia Aloha Raddle., MD;  Location: [facility] ENDOSCOPY;  Service: Gastroenterology;  Laterality: N/A;   FINE NEEDLE ASPIRATION   08/27/2018    Procedure: FINE NEEDLE ASPIRATION (FNA) LINEAR;  Surgeon: Wilhelmenia Aloha Raddle., MD;  Location: [facility] ENDOSCOPY;  Service: Gastroenterology;;   QUILLIAN EXPOSURE Left 04/03/2024    Procedure: GROIN EXPOSURE;  Surgeon: Serene Gaile ORN, MD;  Location: [facility] OR;  Service: Vascular;  Laterality: Left;   INCISIONAL HERNIA REPAIR N/A 06/23/2020    Procedure: OPEN INCISIONAL HERNIA REPAIR WITH MESH;  Surgeon: Lyndel Deward PARAS, MD;  Location: [facility] ORS;  Service: General;  Laterality: N/A;  ROOM 2 STARTING AT 11:00AM FOR 90 MIN   IR IMAGING GUIDED PORT INSERTION   01/08/2018   LAPAROSCOPIC NEPHRECTOMY Left 08/01/2016    Procedure: LAPAROSCOPIC  RADICAL NEPHRECTOMY/ REPAIR OF UMBILICAL HERNIA;  Surgeon: Gretel Ferrara, MD;  Location: [facility] ORS;  Service: Urology;  Laterality: Left;   LOWER EXTREMITY ANGIOGRAM   04/03/2024    Procedure: GERALYN, LOWER EXTREMITY;  Surgeon: Serene Gaile ORN, MD;  Location: [facility] OR;  Service: Vascular;;   LOWER EXTREMITY ANGIOGRAM Left 04/03/2024    Procedure: GERALYN, LOWER EXTREMITY WITH PERONEAL AND TRUNK BALLOOON ANGIOPLASTY;  Surgeon: Serene Gaile ORN, MD;  Location: [facility] OR;  Service: Vascular;  Laterality: Left;   LOWER EXTREMITY  ANGIOGRAPHY N/A 12/29/2023    Procedure: Lower Extremity Angiography;  Surgeon: Sheree Penne Bruckner, MD;  Location: [facility] INVASIVE CV LAB;  Service: Cardiovascular;  Laterality: N/A;   LOWER EXTREMITY INTERVENTION N/A 12/29/2023    Procedure: LOWER EXTREMITY INTERVENTION;  Surgeon: Sheree Penne Bruckner, MD;  Location: [facility] INVASIVE CV LAB;  Service: Cardiovascular;  Laterality: N/A;   LOWER EXTREMITY INTERVENTION N/A 04/02/2024    Procedure: LOWER EXTREMITY INTERVENTION;  Surgeon: Magda Debby SAILOR, MD;  Location: [facility] INVASIVE CV LAB;  Service: Cardiovascular;  Laterality: N/A;   LYSIS RE-CHECK Left 04/03/2024    Procedure: LYSIS RE-CHECK;  Surgeon: Serene Gaile ORN, MD;  Location: [facility] OR;  Service: Vascular;  Laterality: Left;   PATCH ANGIOPLASTY Left 04/03/2024    Procedure: ANGIOPLASTY, USING PATCH GRAFT, LEFT COMMON FEMORAL ARTERY;  Surgeon: Serene Gaile ORN, MD;  Location:  [facility] OR;  Service: Vascular;  Laterality: Left;   PERIPHERAL VASCULAR INTERVENTION   10/21/2022    Procedure: PERIPHERAL VASCULAR INTERVENTION;  Surgeon: Sheree Penne Bruckner, MD;  Location: [facility] INVASIVE CV LAB;  Service: Cardiovascular;;   REMOVAL OF STONES   07/23/2018    Procedure: REMOVAL OF GALL STONES;  Surgeon: Wilhelmenia Aloha Raddle., MD;  Location: [facility] ENDOSCOPY;  Service: Gastroenterology;;   REMOVAL OF STONES   08/10/2018    Procedure: REMOVAL OF STONES;  Surgeon: Wilhelmenia Aloha Raddle., MD;  Location: [facility] ENDOSCOPY;  Service: Gastroenterology;;   REMOVAL OF STONES   08/27/2018    Procedure: REMOVAL OF STONES;  Surgeon: Wilhelmenia Aloha Raddle., MD;  Location: [facility] ENDOSCOPY;  Service: Gastroenterology;;   REMOVAL OF STONES   01/24/2020    Procedure: REMOVAL OF STONES;  Surgeon: Wilhelmenia Aloha Raddle., MD;  Location: [facility] ENDOSCOPY;  Service: Gastroenterology;;   ANNETT   07/23/2018    Procedure: ANNETT;  Surgeon: Mansouraty, Aloha Raddle., MD;  Location: [facility] ENDOSCOPY;  Service: Gastroenterology;;   CLEDA  REMOVAL   08/27/2018    Procedure: STENT REMOVAL;  Surgeon: Wilhelmenia Aloha Raddle., MD;  Location: [facility] ENDOSCOPY;  Service: Gastroenterology;;   CLEDA REMOVAL   11/30/2018    Procedure: STENT REMOVAL;  Surgeon: Wilhelmenia Aloha Raddle., MD;  Location: [facility] ENDOSCOPY;  Service: Gastroenterology;;   STENT REMOVAL   01/24/2020    Procedure: STENT REMOVAL;  Surgeon: Wilhelmenia Aloha Raddle., MD;  Location: [facility] ENDOSCOPY;  Service: Gastroenterology;;   THIGH FASCIOTOMY Right 04/03/2024    Procedure: FASCIOTOMY, 4 compartment right lower leg;  Surgeon: Serene Gaile ORN, MD;  Location: [facility] OR;  Service: Vascular;  Laterality: Right;   UPPER GI ENDOSCOPY        x 1   VAGINAL PROLAPSE REPAIR   11/18/2019    [facility]             Family History  Problem Relation Age of Onset   Heart attack Father 25   Heart disease Brother 22        CABG   Colon cancer Neg Hx     Esophageal cancer Neg Hx     Inflammatory bowel disease Neg Hx     Liver disease Neg Hx     Pancreatic cancer Neg Hx     Rectal cancer Neg Hx     Stomach cancer Neg Hx          Social History:  reports that she quit smoking about 59 years ago. Her smoking use included cigarettes. She started smoking about 67 years ago. She has a 8 pack-year smoking history. She has never used smokeless tobacco. She reports that she does not drink alcohol  and does not use drugs. Allergies: [Allergies]  [Allergies]      Allergen Reactions   Doxycycline Nausea And Vomiting   Adhesive [Tape] Other (See Comments)      Tears skin off Paper tape is ok per patient   Cephalosporins Dermatitis      Tolerated cefazolin  and ceftriaxone     Cymbalta [Duloxetine Hcl]     Duloxetine Other (See Comments)      Feel woozy   Nsaids Other (See Comments)      Pt only has one kidney    Statins Other (See Comments)      Liver/kidney issues.         Medications Prior to Admission  Medication Sig Dispense Refill   acyclovir  (ZOVIRAX ) 400 MG tablet TAKE 1 TABLET BY  MOUTH EVERY  DAY 90 tablet 1   amLODipine  (NORVASC ) 5 MG tablet Take 5 mg by mouth daily.   6   aspirin  EC 81 MG tablet Take 1 tablet (81 mg total) by mouth daily. Swallow whole. 90 tablet 3   atorvastatin  (LIPITOR) 80 MG tablet Take 80 mg by mouth at bedtime.        carvedilol  (COREG ) 6.25 MG tablet Take 6.25 mg by mouth 2 (two) times daily.   6   clobetasol  ointment (TEMOVATE ) 0.05 % Apply 1 application  topically 2 (two) times daily. lichen sclerosis       clopidogrel  (PLAVIX ) 75 MG tablet Take 75 mg by mouth at bedtime.       docusate sodium  (COLACE) 100 MG capsule Take 100 mg by mouth daily.       hydrocortisone  (CORTEF ) 10 MG tablet Take 5-10 mg by mouth See admin instructions. Take 1 tablet (10 mg) by mouth in the morning & 1/2 tablet (5 mg) by mouth in the evening.       ipratropium (ATROVENT  HFA) 17 MCG/ACT inhaler Inhale 2 puffs into the lungs every 6 (six) hours as needed for wheezing.       levothyroxine  (SYNTHROID ) 75 MCG tablet Take 75 mcg by mouth daily before breakfast.        lidocaine  (LIDODERM ) 5 % Place 1 patch onto the skin daily as needed. Remove & Discard patch within 12 hours or as directed by MD (Patient taking differently: Place 1 patch onto the skin daily. Remove & Discard patch within 12 hours or as directed by MD) 30 patch 1   lidocaine  (LMX) 4 % cream Apply 1 Application topically daily as needed (pain).       lidocaine -prilocaine  (EMLA ) cream Apply 1 Application topically every 3 (three) months. Prior to port access       mupirocin  ointment (BACTROBAN ) 2 % Apply topically 2 (two) times daily. (Patient taking differently: Apply 1 Application topically daily.) 30 g 1   pantoprazole  (PROTONIX ) 20 MG tablet Take 1 tablet (20 mg total) by mouth daily. (Patient taking differently: Take 20 mg by mouth daily as needed (indigestion/heartburn.).) 30 tablet 6   repaglinide  (PRANDIN ) 0.5 MG tablet Take 0.5 mg by mouth 2 (two) times daily.       SSD 1 % cream Apply 1 Application  topically daily as needed (Foot infection).       traMADol  (ULTRAM ) 50 MG tablet Take 1 tablet (50 mg total) by mouth every 6 (six) hours as needed. (Patient taking differently: Take 50 mg by mouth 4 (four) times daily as needed (pain.).) 15 tablet 0              Home: Home Living Family/patient expects to be discharged to:: Private residence Living Arrangements: Alone Available Help at Discharge: Family, Friend(s) Type of Home: House Home Access: Stairs to enter Secretary/administrator of Steps: 1 Entrance Stairs-Rails: None Home Layout: Two level, Able to live on main level with bedroom/bathroom Bathroom Shower/Tub: Engineer, Manufacturing Systems: Handicapped height Bathroom Accessibility: Yes Home Equipment: Agricultural Consultant (2 wheels), Rollator (4 wheels), [employer] - quad Additional Comments: sleeps on a day bed   Functional History: Prior Function Prior Level of Function : Independent/Modified Independent, Driving Mobility Comments: No AD in the home, uses QC when she goes to the stores; no recent falls ADLs Comments: ind   Functional Status:  Mobility: Bed Mobility Overal bed mobility: Needs Assistance Bed Mobility: Supine to Sit Supine to sit: Contact guard, HOB elevated,  Used rails General bed mobility comments: increased time Transfers Overall transfer level: Needs assistance Equipment used: Rolling walker (2 wheels) Transfers: Sit to/from Stand, Bed to chair/wheelchair/BSC Sit to Stand: Min assist Bed to/from chair/wheelchair/BSC transfer type:: Step pivot Step pivot transfers: Mod assist General transfer comment: STS from EOB and BSC with min A for rise and cues for hand placement. Pt maintains flexed posture despite cues and very limited RLE WB tolerance with R foot placed anterior. Pt able to improve upright posture in static stance with cues for increased BUE support Pivot to [facility] and to recliner with assist for balance and RW  management. Ambulation/Gait Ambulation/Gait assistance: Max assist Gait Distance (Feet): 3 Feet Assistive device: Rolling walker (2 wheels) Gait Pattern/deviations: Leaning posteriorly, Trunk flexed (hop-to/pivot) General Gait Details: Cued pt to push down on RW to offload R leg to hop with L but pt opted to be NWB on R leg and instead pivot heel-to on L leg until she reached the chair. She leaned posteriorly throughout and almost sat prematurely, needing maxA for balance and to safely direct her to the chair Gait velocity: reduced Gait velocity interpretation: <1.31 ft/sec, indicative of household ambulator   ADL: ADL Overall ADL's : Needs assistance/impaired Eating/Feeding: Set up, Sitting Grooming: Set up, Sitting Upper Body Bathing: Minimal assistance, Sitting Lower Body Bathing: Maximal assistance Upper Body Dressing : Minimal assistance Lower Body Dressing: Maximal assistance Toilet Transfer: Maximal assistance Functional mobility during ADLs: Maximal assistance   Cognition: Cognition Orientation Level: Oriented X4 Cognition Arousal: Alert Behavior During Therapy: WFL for tasks assessed/performed   Physical Exam: Blood pressure (!) 141/61, pulse 73, temperature 98.5 F (36.9 C), temperature source Oral, resp. rate 18, height 4' 9.99 (1.473 m), weight 54.4 kg, SpO2 100%. Physical Exam Constitutional:      Appearance: Normal appearance. She is normal weight.     Comments: Wearing a cap in bed; with lipstick in place; awake, alert, overall appropriate, NAD  HENT:     Head: Normocephalic and atraumatic.     Right Ear: External ear normal.     Left Ear: External ear normal.     Nose: Nose normal. No congestion.     Mouth/Throat:     Mouth: Mucous membranes are dry.     Pharynx: No oropharyngeal exudate.  Eyes:     General:        Right eye: No discharge.        Left eye: No discharge.     Extraocular Movements: Extraocular movements intact.  Cardiovascular:      Rate and Rhythm: Normal rate and regular rhythm.     Heart sounds: Normal heart sounds. No murmur heard.    No gallop.  Pulmonary:     Effort: Pulmonary effort is normal. No respiratory distress.     Breath sounds: Normal breath sounds. No wheezing, rhonchi or rales.  Abdominal:     General: Bowel sounds are normal. There is no distension.     Palpations: Abdomen is soft.     Tenderness: There is no abdominal tenderness.  Musculoskeletal:     Cervical back: Neck supple.     Comments: Ue's 5-/5 B/L throughout RLE- HF 3/5, KE at least 3+/5; DF 1/5 and PF 2/5- has R foot drop LLE- 4/5 throughout-   Skin:    General: Skin is warm and dry.     Comments: Multiple dressing C/D/I on RLE- with socks on- asked me to not remove socks since it hurts   Neurological:  Mental Status: She is alert and oriented to person, place, and time.     Comments: Slightly decreased to Light touch at mid calf down on R and B/L LE's from ankles down from neuropathy  Psychiatric:     Comments:  Frustrated about bedpan need       Lab Results Last 48 Hours        Results for orders placed or performed during the hospital encounter of 04/02/24 (from the past 48 hours)  Glucose, capillary     Status: Abnormal    Collection Time: 04/04/24  4:13 PM  Result Value Ref Range    Glucose-Capillary 292 (H) 70 - 99 mg/dL      Comment: Glucose reference range applies only to samples taken after fasting for at least 8 hours.  Hemoglobin and hematocrit, blood     Status: Abnormal    Collection Time: 04/04/24  6:18 PM  Result Value Ref Range    Hemoglobin 9.0 (L) 12.0 - 15.0 g/dL    HCT 73.5 (L) 63.9 - 46.0 %      Comment: Performed at [facility], [address], [place], [place] [address]  Glucose, capillary     Status: Abnormal    Collection Time: 04/04/24  7:17 PM  Result Value Ref Range    Glucose-Capillary 338 (H) 70 - 99 mg/dL      Comment: Glucose reference range applies only to samples taken after  fasting for at least 8 hours.  Glucose, capillary     Status: Abnormal    Collection Time: 04/04/24 11:19 PM  Result Value Ref Range    Glucose-Capillary 181 (H) 70 - 99 mg/dL      Comment: Glucose reference range applies only to samples taken after fasting for at least 8 hours.  Glucose, capillary     Status: Abnormal    Collection Time: 04/05/24  3:52 AM  Result Value Ref Range    Glucose-Capillary 194 (H) 70 - 99 mg/dL      Comment: Glucose reference range applies only to samples taken after fasting for at least 8 hours.  Basic metabolic panel     Status: Abnormal    Collection Time: 04/05/24  3:54 AM  Result Value Ref Range    Sodium 139 135 - 145 mmol/L    Potassium 4.2 3.5 - 5.1 mmol/L    Chloride 113 (H) 98 - 111 mmol/L    CO2 18 (L) 22 - 32 mmol/L    Glucose, Bld 195 (H) 70 - 99 mg/dL      Comment: Glucose reference range applies only to samples taken after fasting for at least 8 hours.    BUN 29 (H) 8 - 23 mg/dL    Creatinine, Ser 8.32 (H) 0.44 - 1.00 mg/dL    Calcium  7.9 (L) 8.9 - 10.3 mg/dL    GFR, Estimated 30 (L) >60 mL/min      Comment: (NOTE) Calculated using the CKD-EPI Creatinine Equation (2021)      Anion gap 8 5 - 15      Comment: Performed at [facility], [address] [place], [place] [address]  Lipid panel     Status: Abnormal    Collection Time: 04/05/24  3:54 AM  Result Value Ref Range    Cholesterol 94 0 - 200 mg/dL    Triglycerides 815 (H) <150 mg/dL    HDL 14 (L) >59 mg/dL    Total CHOL/HDL Ratio 6.7 RATIO  VLDL 37 0 - 40 mg/dL    LDL Cholesterol 43 0 - 99 mg/dL      Comment:        Total Cholesterol/HDL:CHD Risk Coronary Heart Disease Risk Table                     Men   Women  1/2 Average Risk   3.4   3.3  Average Risk       5.0   4.4  2 X Average Risk   9.6   7.1  3 X Average Risk  23.4   11.0        Use the calculated Patient Ratio above and the CHD Risk Table to determine the patient's CHD Risk.        ATP III  CLASSIFICATION (LDL):  <100     mg/dL   Optimal  899-870  mg/dL   Near or Above                    Optimal  130-159  mg/dL   Borderline  839-810  mg/dL   High  >809     mg/dL   Very High Performed at [facility], [address], [place], [place] [address]    CBC     Status: Abnormal    Collection Time: 04/05/24  3:54 AM  Result Value Ref Range    WBC 9.5 4.0 - 10.5 K/uL    RBC 3.17 (L) 3.87 - 5.11 MIL/uL    Hemoglobin 9.7 (L) 12.0 - 15.0 g/dL    HCT 71.3 (L) 63.9 - 46.0 %    MCV 90.2 80.0 - 100.0 fL    MCH 30.6 26.0 - 34.0 pg    MCHC 33.9 30.0 - 36.0 g/dL    RDW 82.6 (H) 88.4 - 15.5 %    Platelets 161 150 - 400 K/uL    nRBC 0.0 0.0 - 0.2 %      Comment: Performed at [facility], [address], [place], [place] [address]  TSH     Status: None    Collection Time: 04/05/24  3:54 AM  Result Value Ref Range    TSH 1.379 0.350 - 4.500 uIU/mL      Comment: Performed by a 3rd Generation assay with a functional sensitivity of <=0.01 uIU/mL. Performed at [facility], [address], , [place] [address]    CK     Status: Abnormal    Collection Time: 04/05/24  3:54 AM  Result Value Ref Range    Total CK 1,107 (H) 38 - 234 U/L      Comment: Performed at [facility], [address] [place], [place] [address]  Glucose, capillary     Status: Abnormal    Collection Time: 04/05/24  6:53 AM  Result Value Ref Range    Glucose-Capillary 169 (H) 70 - 99 mg/dL      Comment: Glucose reference range applies only to samples taken after fasting for at least 8 hours.  Glucose, capillary     Status: Abnormal    Collection Time: 04/05/24 11:16 AM  Result Value Ref Range    Glucose-Capillary 209 (H) 70 - 99 mg/dL      Comment: Glucose reference range applies only to samples taken after fasting for at least 8 hours.  Glucose, capillary     Status: Abnormal    Collection Time: 04/05/24  3:43 PM  Result Value  Ref Range    Glucose-Capillary 262 (H) 70 - 99 mg/dL       Comment: Glucose reference range applies only to samples taken after fasting for at least 8 hours.  Glucose, capillary     Status: Abnormal    Collection Time: 04/05/24  9:35 PM  Result Value Ref Range    Glucose-Capillary 235 (H) 70 - 99 mg/dL      Comment: Glucose reference range applies only to samples taken after fasting for at least 8 hours.  Glucose, capillary     Status: Abnormal    Collection Time: 04/06/24 12:45 AM  Result Value Ref Range    Glucose-Capillary 146 (H) 70 - 99 mg/dL      Comment: Glucose reference range applies only to samples taken after fasting for at least 8 hours.  Basic metabolic panel     Status: Abnormal    Collection Time: 04/06/24 12:58 AM  Result Value Ref Range    Sodium 139 135 - 145 mmol/L    Potassium 3.8 3.5 - 5.1 mmol/L    Chloride 104 98 - 111 mmol/L    CO2 26 22 - 32 mmol/L    Glucose, Bld 159 (H) 70 - 99 mg/dL      Comment: Glucose reference range applies only to samples taken after fasting for at least 8 hours.    BUN 24 (H) 8 - 23 mg/dL    Creatinine, Ser 8.49 (H) 0.44 - 1.00 mg/dL    Calcium  7.8 (L) 8.9 - 10.3 mg/dL    GFR, Estimated 35 (L) >60 mL/min      Comment: (NOTE) Calculated using the CKD-EPI Creatinine Equation (2021)      Anion gap 9 5 - 15      Comment: Performed at [facility], [address] [place], [place] [address]  CK     Status: Abnormal    Collection Time: 04/06/24 12:58 AM  Result Value Ref Range    Total CK 903 (H) 38 - 234 U/L      Comment: Performed at [facility], [address], [place], [place] [address]  CBC     Status: Abnormal    Collection Time: 04/06/24 12:58 AM  Result Value Ref Range    WBC 9.0 4.0 - 10.5 K/uL    RBC 2.89 (L) 3.87 - 5.11 MIL/uL    Hemoglobin 8.7 (L) 12.0 - 15.0 g/dL    HCT 74.1 (L) 63.9 - 46.0 %    MCV 89.3 80.0 - 100.0 fL    MCH 30.1 26.0 - 34.0 pg    MCHC 33.7 30.0 - 36.0 g/dL    RDW 83.7 (H) 88.4 - 15.5 %    Platelets 165 150 - 400 K/uL    nRBC 0.0 0.0 - 0.2  %      Comment: Performed at [facility], [address], [place], [place] [address]  Glucose, capillary     Status: Abnormal    Collection Time: 04/06/24  7:33 AM  Result Value Ref Range    Glucose-Capillary 111 (H) 70 - 99 mg/dL      Comment: Glucose reference range applies only to samples taken after fasting for at least 8 hours.  Glucose, capillary     Status: Abnormal    Collection Time: 04/06/24 11:39 AM  Result Value Ref Range    Glucose-Capillary 245 (H) 70 - 99 mg/dL      Comment: Glucose reference range applies only to  samples taken after fasting for at least 8 hours.    *Note: Due to a large number of results and/or encounters for the requested time period, some results have not been displayed. A complete set of results can be found in Results Review.      Imaging Results (Last 48 hours)  ECHOCARDIOGRAM COMPLETE Result Date: 04/04/2024    ECHOCARDIOGRAM REPORT   Patient Name:   Desirae JONELLE [facility] Date of Exam: 04/04/2024 Medical Rec #:  [id number]   Height:       58.0 in Accession #:    [id number]  Weight:       120.0 lb Date of Birth:  [birth year]  BSA:          1.466 m Patient Age:    83 years    BP:           144/65 mmHg Patient Gender: F           HR:           68 bpm. Exam Location:  Inpatient Procedure: 2D Echo (Both Spectral and Color Flow Doppler were utilized during            procedure). Indications:    abnormal ecg  History:        Patient has no prior history of Echocardiogram examinations.                 CAD, PAD and metastatic cancer. chronic kidney disease; Risk                 Factors:Hypertension, Dyslipidemia and Diabetes.  Sonographer:    Tinnie Barefoot RDCS Referring Phys: [id number] PAULA B SIMPSON IMPRESSIONS  1. Left ventricular ejection fraction, by estimation, is 50 to 55%. The left ventricle has low normal function. The left ventricle has no regional wall motion abnormalities. There is mild left ventricular hypertrophy. Left ventricular diastolic parameters are  indeterminate.  2. Right ventricular systolic function is normal. The right ventricular size is normal. Tricuspid regurgitation signal is inadequate for assessing PA pressure.  3. The mitral valve is normal in structure. Trivial mitral valve regurgitation. No evidence of mitral stenosis.  4. The aortic valve is tricuspid. Aortic valve regurgitation is not visualized. Aortic valve sclerosis is present, with no evidence of aortic valve stenosis.  5. The inferior vena cava is normal in size with greater than 50% respiratory variability, suggesting right atrial pressure of 3 mmHg. FINDINGS  Left Ventricle: Left ventricular ejection fraction, by estimation, is 50 to 55%. The left ventricle has low normal function. The left ventricle has no regional wall motion abnormalities. The left ventricular internal cavity size was normal in size. There is mild left ventricular hypertrophy. Left ventricular diastolic parameters are indeterminate. Right Ventricle: The right ventricular size is normal. No increase in right ventricular wall thickness. Right ventricular systolic function is normal. Tricuspid regurgitation signal is inadequate for assessing PA pressure. Left Atrium: Left atrial size was normal in size. Right Atrium: Right atrial size was normal in size. Pericardium: There is no evidence of pericardial effusion. Mitral Valve: The mitral valve is normal in structure. Trivial mitral valve regurgitation. No evidence of mitral valve stenosis. Tricuspid Valve: The tricuspid valve is normal in structure. Tricuspid valve regurgitation is trivial. Aortic Valve: The aortic valve is tricuspid. Aortic valve regurgitation is not visualized. Aortic valve sclerosis is present, with no evidence of aortic valve stenosis. Pulmonic Valve: The pulmonic valve was not well visualized. Pulmonic valve regurgitation is trivial. Aorta: The  aortic root and ascending aorta are structurally normal, with no evidence of dilitation. Venous: The  inferior vena cava is normal in size with greater than 50% respiratory variability, suggesting right atrial pressure of 3 mmHg. IAS/Shunts: The interatrial septum was not well visualized.  LEFT VENTRICLE PLAX 2D LVIDd:         4.30 cm     Diastology LVIDs:         3.20 cm     LV e' medial:    5.11 cm/s LV PW:         0.90 cm     LV E/e' medial:  13.6 LV IVS:        1.00 cm     LV e' lateral:   7.72 cm/s LVOT diam:     2.00 cm     LV E/e' lateral: 9.0 LV SV:         58 LV SV Index:   40 LVOT Area:     3.14 cm LV IVRT:       120 msec  LV Volumes (MOD) LV vol d, MOD A2C: 64.3 ml LV vol d, MOD A4C: 56.9 ml LV vol s, MOD A2C: 27.8 ml LV vol s, MOD A4C: 29.3 ml LV SV MOD A2C:     36.5 ml LV SV MOD A4C:     56.9 ml LV SV MOD BP:      31.8 ml RIGHT VENTRICLE             IVC RV Basal diam:  2.60 cm     IVC diam: 1.50 cm RV S prime:     16.30 cm/s TAPSE (M-mode): 1.7 cm      PULMONARY VEINS                             Diastolic Velocity: 44.60 cm/s                             S/D Velocity:       1.30                             Systolic Velocity:  56.10 cm/s LEFT ATRIUM             Index        RIGHT ATRIUM           Index LA diam:        3.20 cm 2.18 cm/m   RA Area:     13.40 cm LA Vol (A2C):   33.0 ml 22.51 ml/m  RA Volume:   30.00 ml  20.47 ml/m LA Vol (A4C):   45.1 ml 30.77 ml/m LA Biplane Vol: 39.4 ml 26.88 ml/m  AORTIC VALVE LVOT Vmax:   84.60 cm/s LVOT Vmean:  53.400 cm/s LVOT VTI:    0.185 m  AORTA Ao Root diam: 3.30 cm Ao Asc diam:  3.20 cm MITRAL VALVE MV Area (PHT): 3.17 cm    SHUNTS MV Decel Time: 239 msec    Systemic VTI:  0.18 m MR Peak grad: 90.6 mmHg    Systemic Diam: 2.00 cm MR Mean grad: 61.0 mmHg MR Vmax:      476.00 cm/s MR Vmean:     373.0 cm/s MV E velocity: 69.70 cm/s MV A velocity: 71.00 cm/s MV E/A ratio:  0.98 Lonni Nanas MD Electronically  signed by Lonni Nanas MD Signature Date/Time: 04/04/2024/4:09:35 PM    Final             Blood pressure (!) 141/61, pulse 73,  temperature 98.5 F (36.9 C), temperature source Oral, resp. rate 18, height 4' 9.99 (1.473 m), weight 54.4 kg, SpO2 100%.   Medical Problem List and Plan: 1. Functional deficits secondary to Debility due to RLE ischemia s/p fasciotomy x4 with wounds on RLE             -patient may  shower if covers incisions             -ELOS/Goals: ~ 10-14 days- supervision to intermittent mod I             Admit to CIR             Appropriate   2.  Antithrombotics: -DVT/anticoagulation:  Mechanical:  Antiembolism stockings, knee (TED hose) Bilateral lower extremities Pharmaceutical: Heparin              -antiplatelet therapy: Aspirin  and Plavix     3. Pain Management: As needed Tylenol , Voltaren , and oxycodone .   4. Mood/Behavior/Sleep: LCSW to follow for evaluation and support when available.              -antipsychotic agents: N/A             -sleep: Melatonin 3 mg nightly   5. Neuropsych/cognition: This patient is capable of making decisions on her own behalf.   6. Skin/Wound Care: Routine pressure relief measures. -Daily dressing changes to fasciotomies and L groin. Staples present on admission. **Messaged [facility] about management of surgical site dressing changes.    7. Fluids/Electrolytes/Nutrition: Monitor I&O and weight. Follow up labs CBC/CMP     8. Critical limb ischemia: Continue DAPT indefinitely.  Follow-up with [facility] outpatient   9.  PAD: Continue aspirin . Statin being held d/t rhabdomyolysis.    10.  Hypertension: Continue Norvasc  5 mg daily, Coreg  6.25 mg twice daily. Monitor BP per protocol.   11.   Chronic hx of Adrenalectomy 2018: On home Cortef  10 mg daily and 5 mg nightly.    12.  Hypothyroidism: On home Synthroid  75 mcg.    13.  UTI: UA positive on 12/12, no growth on cultures.  On IV Rocephin  EOT 12/17.    10.  T2DM: A1c 10.3-monitor CBG AC/HS with SSI NovoLog , Lantus  5 units twice daily. Q-said received insulin  Rx from Endo, but never got chance to take it.    11.  Constipation: LBM 12/15. Reports norm is once weekly with hx of rectocele, takes stool softener daily.              -Order for Miralax  and colace daily. Simethicone prn.      Daphne LOISE Satterfield, NP 04/06/2024     I have personally performed a face to face diagnostic evaluation of this patient and formulated the key components of the plan.  Additionally, I have personally reviewed laboratory data, imaging studies, as well as relevant notes and concur with the physician assistant's documentation above.   The patient's status has not changed from the original H&P.  Any changes in documentation from the acute care chart have been noted above.

## 2024-04-06 NOTE — H&P (Signed)
 Physical Medicine and Rehabilitation Admission H&P    Chief complaint : Critical limb ischemia  HPI: Audrey [name] is an 83 year old R handed female with PMHx of DM II, GERD, HTN, HLD, hypothyroidism, CAD/MI, adrenal insufficiency, metastatic renal cell carcinoma (adrenal, bone, pulmonary s/p chemo, left nephrectomy and left adrenal resection 2018) currently in remission, PAD, right SFA stenting (10/2022), atherectomy and stenting of right SFA, popliteal and TP trunk (12/29/23).  The patient presented to [facility] on 04/01/2024 for a planned admission by [facility] for critical limb ischemia on heparin  gtt. after presenting to the office with 4-day history of sudden onset of right foot pain and coldness.  She was taken to the OR on 12/12 for angiogram and found to have thrombosis of the right SFA stents by Dr. Magda.  The patient underwent laser arthrectomy right peroneal, TP trunk and popliteal artery angioplasty with EKOS.    On 12/13 patient went to the OR for lysis check and underwent angioplasty to peroneal artery and stent placement of right SFA by Dr. Serene. Postoperatively the patient developed hematoma in the left groin with subsequent loss of doppler signals. Patient endorsed left groin and right calf pain. Due to concerns for compartment syndrome, patient returned to OR for a 4 compartment fasciotomy of the right leg, left iliofemoral endarterectomy with pericardial patch angioplasty and left leg angiogram. Aspirin  and Plavix  initiated on 12/14.  Hospital course complicated by mild rhabdomyolysis and anemia that responded to IV fluid and PRBC transfusion. She completed 5 days therapy of antibiotics for UTI.    Prior to arrival the patient was active and independent in the community with use of DME.  She currently lives alone in a two-level house but able to live on the main level.  She currently requires min to mod assist with mobility and basic ADLs. Therapy evaluations completed due to  patient decreased functional mobility was admitted for a comprehensive rehab program.  Pt reports Foley was removed- but having some urgency to void.  Pain 6-7/10 at rest and with movement gets to ~9/10. LBM this AM-  Notes she lives alone and daughter can only stay 1 week but per admissions coordinator can also go stay with daughter, although she doesn't like daughter's cats- is not allergic fyi.    Review of Systems  Constitutional:  Positive for malaise/fatigue.  HENT: Negative.    Eyes: Negative.   Respiratory: Negative.    Cardiovascular:  Positive for leg swelling. Negative for chest pain.  Gastrointestinal:  Negative for constipation, diarrhea and nausea.  Genitourinary:  Positive for frequency and urgency.  Musculoskeletal:  Positive for joint pain and myalgias.  Skin: Negative.   Neurological:  Positive for tingling and focal weakness.  Endo/Heme/Allergies: Negative.   Psychiatric/Behavioral:  The patient has insomnia.   All other systems reviewed and are negative.       Past Medical History:  Diagnosis Date   Anemia    Arthritis    lower back, hips, hands   Biliary stricture (HCC)    Diabetes mellitus (HCC)    type 2    Early cataracts, bilateral    Md just watching   Elevated liver enzymes    Family history of adverse reaction to anesthesia    Daughter hard to wake up   Gallstones    GERD (gastroesophageal reflux disease)    occasional - diet controlled   History of blood transfusion 2018   History of hiatal hernia    HTN (hypertension)  Hyperlipidemia    Hypothyroidism    left renal ca dx'd 2018   renal cancer - left kidney removed, pill chemo x 1 yr   Myocardial infarction (HCC) 1991   no deficits   Peripheral vascular disease    PONV (postoperative nausea and vomiting)    SVD (spontaneous vaginal delivery)    x 3   Wears glasses    Past Surgical History:  Procedure Laterality Date   ABDOMINAL AORTOGRAM N/A 12/29/2023   Procedure: ABDOMINAL  AORTOGRAM;  Surgeon: Sheree Penne Bruckner, MD;  Location: [facility] INVASIVE CV LAB;  Service: Cardiovascular;  Laterality: N/A;   ABDOMINAL AORTOGRAM W/LOWER EXTREMITY N/A 10/21/2022   Procedure: ABDOMINAL AORTOGRAM W/LOWER EXTREMITY;  Surgeon: Sheree Penne Bruckner, MD;  Location: [facility] INVASIVE CV LAB;  Service: Cardiovascular;  Laterality: N/A;   ABDOMINAL AORTOGRAM W/LOWER EXTREMITY N/A 04/02/2024   Procedure: ABDOMINAL AORTOGRAM W/LOWER EXTREMITY;  Surgeon: Magda Debby SAILOR, MD;  Location: [facility] INVASIVE CV LAB;  Service: Cardiovascular;  Laterality: N/A;   BALLOON DILATION N/A 07/23/2018   Procedure: BALLOON DILATION;  Surgeon: Wilhelmenia Aloha Raddle., MD;  Location: [facility] ENDOSCOPY;  Service: Gastroenterology;  Laterality: N/A;   BILIARY BRUSHING  08/10/2018   Procedure: BILIARY BRUSHING;  Surgeon: Wilhelmenia Aloha Raddle., MD;  Location: [facility] ENDOSCOPY;  Service: Gastroenterology;;   BILIARY BRUSHING  11/30/2018   Procedure: BILIARY BRUSHING;  Surgeon: Wilhelmenia Aloha Raddle., MD;  Location: [facility] ENDOSCOPY;  Service: Gastroenterology;;   BILIARY DILATION  08/10/2018   Procedure: BILIARY DILATION;  Surgeon: Wilhelmenia Aloha Raddle., MD;  Location: [facility] ENDOSCOPY;  Service: Gastroenterology;;   BILIARY DILATION  08/27/2018   Procedure: BILIARY DILATION;  Surgeon: Wilhelmenia Aloha Raddle., MD;  Location: [facility] ENDOSCOPY;  Service: Gastroenterology;;   BILIARY DILATION  01/24/2020   Procedure: BILIARY DILATION;  Surgeon: Wilhelmenia Aloha Raddle., MD;  Location: [facility] ENDOSCOPY;  Service: Gastroenterology;;   BILIARY STENT PLACEMENT  08/10/2018   Procedure: BILIARY STENT PLACEMENT;  Surgeon: Wilhelmenia Aloha Raddle., MD;  Location: [facility] ENDOSCOPY;  Service: Gastroenterology;;   BILIARY STENT PLACEMENT  08/27/2018   Procedure: BILIARY STENT PLACEMENT;  Surgeon: Wilhelmenia Aloha Raddle., MD;  Location: [facility] ENDOSCOPY;  Service: Gastroenterology;;   BILIARY STENT PLACEMENT  11/30/2018   Procedure: BILIARY STENT PLACEMENT;  Surgeon:  Wilhelmenia Aloha Raddle., MD;  Location: [facility] ENDOSCOPY;  Service: Gastroenterology;;   BIOPSY  07/23/2018   Procedure: BIOPSY;  Surgeon: Wilhelmenia Aloha Raddle., MD;  Location: [facility] ENDOSCOPY;  Service: Gastroenterology;;   BIOPSY  01/24/2020   Procedure: BIOPSY;  Surgeon: Wilhelmenia Aloha Raddle., MD;  Location: [facility] ENDOSCOPY;  Service: Gastroenterology;;   CHOLECYSTECTOMY N/A 09/02/2018   Procedure: LAPAROSCOPIC CHOLECYSTECTOMY;  Surgeon: Aron Shoulders, MD;  Location: [facility] OR;  Service: General;  Laterality: N/A;   COLONOSCOPY     normal    ENDARTERECTOMY FEMORAL Left 04/03/2024   Procedure: ENDARTERECTOMY, FEMORAL;  Surgeon: Serene Gaile ORN, MD;  Location: [facility] OR;  Service: Vascular;  Laterality: Left;   ENDOSCOPIC RETROGRADE CHOLANGIOPANCREATOGRAPHY (ERCP) WITH PROPOFOL  N/A 08/10/2018   Procedure: ENDOSCOPIC RETROGRADE CHOLANGIOPANCREATOGRAPHY (ERCP) WITH PROPOFOL ;  Surgeon: Wilhelmenia Aloha Raddle., MD;  Location: [facility] ENDOSCOPY;  Service: Gastroenterology;  Laterality: N/A;   ENDOSCOPIC RETROGRADE CHOLANGIOPANCREATOGRAPHY (ERCP) WITH PROPOFOL  N/A 11/30/2018   Procedure: ENDOSCOPIC RETROGRADE CHOLANGIOPANCREATOGRAPHY (ERCP) WITH PROPOFOL ;  Surgeon: Wilhelmenia Aloha Raddle., MD;  Location: [facility] ENDOSCOPY;  Service: Gastroenterology;  Laterality: N/A;   ENDOSCOPIC RETROGRADE CHOLANGIOPANCREATOGRAPHY (ERCP) WITH PROPOFOL  N/A 01/24/2020   Procedure: ENDOSCOPIC RETROGRADE CHOLANGIOPANCREATOGRAPHY (ERCP) WITH PROPOFOL ;  Surgeon: Wilhelmenia Aloha Raddle., MD;  Location: [facility]  ENDOSCOPY;  Service: Gastroenterology;  Laterality: N/A;   ERCP N/A 07/23/2018   Procedure: ENDOSCOPIC RETROGRADE CHOLANGIOPANCREATOGRAPHY (ERCP);  Surgeon: Wilhelmenia Aloha Raddle., MD;  Location: [facility] ENDOSCOPY;  Service: Gastroenterology;  Laterality: N/A;   ERCP N/A 08/27/2018   Procedure: ENDOSCOPIC RETROGRADE CHOLANGIOPANCREATOGRAPHY (ERCP);  Surgeon: Wilhelmenia Aloha Raddle., MD;  Location: [facility] ENDOSCOPY;  Service: Gastroenterology;  Laterality: N/A;    ESOPHAGOGASTRODUODENOSCOPY N/A 01/24/2020   Procedure: ESOPHAGOGASTRODUODENOSCOPY (EGD);  Surgeon: Wilhelmenia Aloha Raddle., MD;  Location: [facility] ENDOSCOPY;  Service: Gastroenterology;  Laterality: N/A;   ESOPHAGOGASTRODUODENOSCOPY (EGD) WITH PROPOFOL  N/A 08/27/2018   Procedure: ESOPHAGOGASTRODUODENOSCOPY (EGD) WITH PROPOFOL ;  Surgeon: Wilhelmenia Aloha Raddle., MD;  Location: [facility] ENDOSCOPY;  Service: Gastroenterology;  Laterality: N/A;   EUS N/A 08/27/2018   Procedure: ESOPHAGEAL ENDOSCOPIC ULTRASOUND (EUS) RADIAL;  Surgeon: Wilhelmenia Aloha Raddle., MD;  Location: [facility] ENDOSCOPY;  Service: Gastroenterology;  Laterality: N/A;   FINE NEEDLE ASPIRATION  08/27/2018   Procedure: FINE NEEDLE ASPIRATION (FNA) LINEAR;  Surgeon: Wilhelmenia Aloha Raddle., MD;  Location: [facility] ENDOSCOPY;  Service: Gastroenterology;;   QUILLIAN EXPOSURE Left 04/03/2024   Procedure: GROIN EXPOSURE;  Surgeon: Serene Gaile ORN, MD;  Location: [facility] OR;  Service: Vascular;  Laterality: Left;   INCISIONAL HERNIA REPAIR N/A 06/23/2020   Procedure: OPEN INCISIONAL HERNIA REPAIR WITH MESH;  Surgeon: Lyndel Deward PARAS, MD;  Location: [facility] ORS;  Service: General;  Laterality: N/A;  ROOM 2 STARTING AT 11:00AM FOR 90 MIN   IR IMAGING GUIDED PORT INSERTION  01/08/2018   LAPAROSCOPIC NEPHRECTOMY Left 08/01/2016   Procedure: LAPAROSCOPIC  RADICAL NEPHRECTOMY/ REPAIR OF UMBILICAL HERNIA;  Surgeon: Gretel Ferrara, MD;  Location: [facility] ORS;  Service: Urology;  Laterality: Left;   LOWER EXTREMITY ANGIOGRAM  04/03/2024   Procedure: GERALYN, LOWER EXTREMITY;  Surgeon: Serene Gaile ORN, MD;  Location: [facility] OR;  Service: Vascular;;   LOWER EXTREMITY ANGIOGRAM Left 04/03/2024   Procedure: GERALYN, LOWER EXTREMITY WITH PERONEAL AND TRUNK BALLOOON ANGIOPLASTY;  Surgeon: Serene Gaile ORN, MD;  Location: [facility] OR;  Service: Vascular;  Laterality: Left;   LOWER EXTREMITY ANGIOGRAPHY N/A 12/29/2023   Procedure: Lower Extremity Angiography;  Surgeon: Sheree Penne Bruckner, MD;   Location: [facility] INVASIVE CV LAB;  Service: Cardiovascular;  Laterality: N/A;   LOWER EXTREMITY INTERVENTION N/A 12/29/2023   Procedure: LOWER EXTREMITY INTERVENTION;  Surgeon: Sheree Penne Bruckner, MD;  Location: [facility] INVASIVE CV LAB;  Service: Cardiovascular;  Laterality: N/A;   LOWER EXTREMITY INTERVENTION N/A 04/02/2024   Procedure: LOWER EXTREMITY INTERVENTION;  Surgeon: Magda Debby SAILOR, MD;  Location: [facility] INVASIVE CV LAB;  Service: Cardiovascular;  Laterality: N/A;   LYSIS RE-CHECK Left 04/03/2024   Procedure: LYSIS RE-CHECK;  Surgeon: Serene Gaile ORN, MD;  Location: [facility] OR;  Service: Vascular;  Laterality: Left;   PATCH ANGIOPLASTY Left 04/03/2024   Procedure: ANGIOPLASTY, USING PATCH GRAFT, LEFT COMMON FEMORAL ARTERY;  Surgeon: Serene Gaile ORN, MD;  Location: [facility] OR;  Service: Vascular;  Laterality: Left;   PERIPHERAL VASCULAR INTERVENTION  10/21/2022   Procedure: PERIPHERAL VASCULAR INTERVENTION;  Surgeon: Sheree Penne Bruckner, MD;  Location: [facility] INVASIVE CV LAB;  Service: Cardiovascular;;   REMOVAL OF STONES  07/23/2018   Procedure: REMOVAL OF GALL STONES;  Surgeon: Wilhelmenia Aloha Raddle., MD;  Location: [facility] ENDOSCOPY;  Service: Gastroenterology;;   REMOVAL OF STONES  08/10/2018   Procedure: REMOVAL OF STONES;  Surgeon: Wilhelmenia Aloha Raddle., MD;  Location: [facility] ENDOSCOPY;  Service: Gastroenterology;;   REMOVAL OF STONES  08/27/2018   Procedure: REMOVAL OF STONES;  Surgeon: Wilhelmenia Aloha  Mickey., MD;  Location: [facility] ENDOSCOPY;  Service: Gastroenterology;;   REMOVAL OF STONES  01/24/2020   Procedure: REMOVAL OF STONES;  Surgeon: Wilhelmenia Aloha Mickey., MD;  Location: [facility] ENDOSCOPY;  Service: Gastroenterology;;   ANNETT  07/23/2018   Procedure: ANNETT;  Surgeon: Mansouraty, Aloha Mickey., MD;  Location: [facility] ENDOSCOPY;  Service: Gastroenterology;;   CLEDA REMOVAL  08/27/2018   Procedure: STENT REMOVAL;  Surgeon: Wilhelmenia Aloha Mickey., MD;  Location: [facility] ENDOSCOPY;  Service:  Gastroenterology;;   CLEDA REMOVAL  11/30/2018   Procedure: STENT REMOVAL;  Surgeon: Wilhelmenia Aloha Mickey., MD;  Location: [facility] ENDOSCOPY;  Service: Gastroenterology;;   CLEDA REMOVAL  01/24/2020   Procedure: STENT REMOVAL;  Surgeon: Wilhelmenia Aloha Mickey., MD;  Location: [facility] ENDOSCOPY;  Service: Gastroenterology;;   THIGH FASCIOTOMY Right 04/03/2024   Procedure: FASCIOTOMY, 4 compartment right lower leg;  Surgeon: Serene Gaile ORN, MD;  Location: [facility] OR;  Service: Vascular;  Laterality: Right;   UPPER GI ENDOSCOPY     x 1   VAGINAL PROLAPSE REPAIR  11/18/2019   [facility]   Family History  Problem Relation Age of Onset   Heart attack Father 85   Heart disease Brother 62       CABG   Colon cancer Neg Hx    Esophageal cancer Neg Hx    Inflammatory bowel disease Neg Hx    Liver disease Neg Hx    Pancreatic cancer Neg Hx    Rectal cancer Neg Hx    Stomach cancer Neg Hx    Social History:  reports that she quit smoking about 59 years ago. Her smoking use included cigarettes. She started smoking about 67 years ago. She has a 8 pack-year smoking history. She has never used smokeless tobacco. She reports that she does not drink alcohol  and does not use drugs. Allergies: Allergies[1] Medications Prior to Admission  Medication Sig Dispense Refill   acyclovir  (ZOVIRAX ) 400 MG tablet TAKE 1 TABLET BY MOUTH EVERY DAY 90 tablet 1   amLODipine  (NORVASC ) 5 MG tablet Take 5 mg by mouth daily.  6   aspirin  EC 81 MG tablet Take 1 tablet (81 mg total) by mouth daily. Swallow whole. 90 tablet 3   atorvastatin  (LIPITOR) 80 MG tablet Take 80 mg by mouth at bedtime.      carvedilol  (COREG ) 6.25 MG tablet Take 6.25 mg by mouth 2 (two) times daily.  6   clobetasol  ointment (TEMOVATE ) 0.05 % Apply 1 application  topically 2 (two) times daily. lichen sclerosis     clopidogrel  (PLAVIX ) 75 MG tablet Take 75 mg by mouth at bedtime.     docusate sodium  (COLACE) 100 MG capsule Take 100 mg by mouth daily.      hydrocortisone  (CORTEF ) 10 MG tablet Take 5-10 mg by mouth See admin instructions. Take 1 tablet (10 mg) by mouth in the morning & 1/2 tablet (5 mg) by mouth in the evening.     ipratropium (ATROVENT  HFA) 17 MCG/ACT inhaler Inhale 2 puffs into the lungs every 6 (six) hours as needed for wheezing.     levothyroxine  (SYNTHROID ) 75 MCG tablet Take 75 mcg by mouth daily before breakfast.      lidocaine  (LIDODERM ) 5 % Place 1 patch onto the skin daily as needed. Remove & Discard patch within 12 hours or as directed by MD (Patient taking differently: Place 1 patch onto the skin daily. Remove & Discard patch within 12 hours or as directed by MD) 30 patch 1   lidocaine  (  LMX) 4 % cream Apply 1 Application topically daily as needed (pain).     lidocaine -prilocaine  (EMLA ) cream Apply 1 Application topically every 3 (three) months. Prior to port access     mupirocin  ointment (BACTROBAN ) 2 % Apply topically 2 (two) times daily. (Patient taking differently: Apply 1 Application topically daily.) 30 g 1   pantoprazole  (PROTONIX ) 20 MG tablet Take 1 tablet (20 mg total) by mouth daily. (Patient taking differently: Take 20 mg by mouth daily as needed (indigestion/heartburn.).) 30 tablet 6   repaglinide  (PRANDIN ) 0.5 MG tablet Take 0.5 mg by mouth 2 (two) times daily.     SSD 1 % cream Apply 1 Application topically daily as needed (Foot infection).     traMADol  (ULTRAM ) 50 MG tablet Take 1 tablet (50 mg total) by mouth every 6 (six) hours as needed. (Patient taking differently: Take 50 mg by mouth 4 (four) times daily as needed (pain.).) 15 tablet 0      Home: Home Living Family/patient expects to be discharged to:: Private residence Living Arrangements: Alone Available Help at Discharge: Family, Friend(s) Type of Home: House Home Access: Stairs to enter Secretary/administrator of Steps: 1 Entrance Stairs-Rails: None Home Layout: Two level, Able to live on main level with bedroom/bathroom Bathroom Shower/Tub:  Engineer, Manufacturing Systems: Handicapped height Bathroom Accessibility: Yes Home Equipment: Agricultural Consultant (2 wheels), Rollator (4 wheels), [employer] - quad Additional Comments: sleeps on a day bed   Functional History: Prior Function Prior Level of Function : Independent/Modified Independent, Driving Mobility Comments: No AD in the home, uses QC when she goes to the stores; no recent falls ADLs Comments: ind  Functional Status:  Mobility: Bed Mobility Overal bed mobility: Needs Assistance Bed Mobility: Supine to Sit Supine to sit: Contact guard, HOB elevated, Used rails General bed mobility comments: increased time Transfers Overall transfer level: Needs assistance Equipment used: Rolling walker (2 wheels) Transfers: Sit to/from Stand, Bed to chair/wheelchair/BSC Sit to Stand: Min assist Bed to/from chair/wheelchair/BSC transfer type:: Step pivot Step pivot transfers: Mod assist General transfer comment: STS from EOB and BSC with min A for rise and cues for hand placement. Pt maintains flexed posture despite cues and very limited RLE WB tolerance with R foot placed anterior. Pt able to improve upright posture in static stance with cues for increased BUE support Pivot to [facility] and to recliner with assist for balance and RW management. Ambulation/Gait Ambulation/Gait assistance: Max assist Gait Distance (Feet): 3 Feet Assistive device: Rolling walker (2 wheels) Gait Pattern/deviations: Leaning posteriorly, Trunk flexed (hop-to/pivot) General Gait Details: Cued pt to push down on RW to offload R leg to hop with L but pt opted to be NWB on R leg and instead pivot heel-to on L leg until she reached the chair. She leaned posteriorly throughout and almost sat prematurely, needing maxA for balance and to safely direct her to the chair Gait velocity: reduced Gait velocity interpretation: <1.31 ft/sec, indicative of household ambulator    ADL: ADL Overall ADL's : Needs  assistance/impaired Eating/Feeding: Set up, Sitting Grooming: Set up, Sitting Upper Body Bathing: Minimal assistance, Sitting Lower Body Bathing: Maximal assistance Upper Body Dressing : Minimal assistance Lower Body Dressing: Maximal assistance Toilet Transfer: Maximal assistance Functional mobility during ADLs: Maximal assistance  Cognition: Cognition Orientation Level: Oriented X4 Cognition Arousal: Alert Behavior During Therapy: WFL for tasks assessed/performed  Physical Exam: Blood pressure (!) 141/61, pulse 73, temperature 98.5 F (36.9 C), temperature source Oral, resp. rate 18, height 4' 9.99 (1.473 m),  weight 54.4 kg, SpO2 100%. Physical Exam Constitutional:      Appearance: Normal appearance. She is normal weight.     Comments: Wearing a cap in bed; with lipstick in place; awake, alert, overall appropriate, NAD  HENT:     Head: Normocephalic and atraumatic.     Right Ear: External ear normal.     Left Ear: External ear normal.     Nose: Nose normal. No congestion.     Mouth/Throat:     Mouth: Mucous membranes are dry.     Pharynx: No oropharyngeal exudate.  Eyes:     General:        Right eye: No discharge.        Left eye: No discharge.     Extraocular Movements: Extraocular movements intact.  Cardiovascular:     Rate and Rhythm: Normal rate and regular rhythm.     Heart sounds: Normal heart sounds. No murmur heard.    No gallop.  Pulmonary:     Effort: Pulmonary effort is normal. No respiratory distress.     Breath sounds: Normal breath sounds. No wheezing, rhonchi or rales.  Abdominal:     General: Bowel sounds are normal. There is no distension.     Palpations: Abdomen is soft.     Tenderness: There is no abdominal tenderness.  Musculoskeletal:     Cervical back: Neck supple.     Comments: Ue's 5-/5 B/L throughout RLE- HF 3/5, KE at least 3+/5; DF 1/5 and PF 2/5- has R foot drop LLE- 4/5 throughout-   Skin:    General: Skin is warm and dry.      Comments: Multiple dressing C/D/I on RLE- with socks on- asked me to not remove socks since it hurts   Neurological:     Mental Status: She is alert and oriented to person, place, and time.     Comments: Slightly decreased to Light touch at mid calf down on R and B/L LE's from ankles down from neuropathy  Psychiatric:     Comments:  Frustrated about bedpan need     Results for orders placed or performed during the hospital encounter of 04/02/24 (from the past 48 hours)  Glucose, capillary     Status: Abnormal   Collection Time: 04/04/24  4:13 PM  Result Value Ref Range   Glucose-Capillary 292 (H) 70 - 99 mg/dL    Comment: Glucose reference range applies only to samples taken after fasting for at least 8 hours.  Hemoglobin and hematocrit, blood     Status: Abnormal   Collection Time: 04/04/24  6:18 PM  Result Value Ref Range   Hemoglobin 9.0 (L) 12.0 - 15.0 g/dL   HCT 73.5 (L) 63.9 - 53.9 %    Comment: Performed at [facility], [address], [place], [place] [address]  Glucose, capillary     Status: Abnormal   Collection Time: 04/04/24  7:17 PM  Result Value Ref Range   Glucose-Capillary 338 (H) 70 - 99 mg/dL    Comment: Glucose reference range applies only to samples taken after fasting for at least 8 hours.  Glucose, capillary     Status: Abnormal   Collection Time: 04/04/24 11:19 PM  Result Value Ref Range   Glucose-Capillary 181 (H) 70 - 99 mg/dL    Comment: Glucose reference range applies only to samples taken after fasting for at least 8 hours.  Glucose, capillary     Status: Abnormal   Collection Time: 04/05/24  3:52 AM  Result Value Ref Range   Glucose-Capillary 194 (H) 70 - 99 mg/dL    Comment: Glucose reference range applies only to samples taken after fasting for at least 8 hours.  Basic metabolic panel     Status: Abnormal   Collection Time: 04/05/24  3:54 AM  Result Value Ref Range   Sodium 139 135 - 145 mmol/L   Potassium 4.2 3.5 - 5.1 mmol/L    Chloride 113 (H) 98 - 111 mmol/L   CO2 18 (L) 22 - 32 mmol/L   Glucose, Bld 195 (H) 70 - 99 mg/dL    Comment: Glucose reference range applies only to samples taken after fasting for at least 8 hours.   BUN 29 (H) 8 - 23 mg/dL   Creatinine, Ser 8.32 (H) 0.44 - 1.00 mg/dL   Calcium  7.9 (L) 8.9 - 10.3 mg/dL   GFR, Estimated 30 (L) >60 mL/min    Comment: (NOTE) Calculated using the CKD-EPI Creatinine Equation (2021)    Anion gap 8 5 - 15    Comment: Performed at [facility], [address], [place], [place] [address]  Lipid panel     Status: Abnormal   Collection Time: 04/05/24  3:54 AM  Result Value Ref Range   Cholesterol 94 0 - 200 mg/dL   Triglycerides 815 (H) <150 mg/dL   HDL 14 (L) >59 mg/dL   Total CHOL/HDL Ratio 6.7 RATIO   VLDL 37 0 - 40 mg/dL   LDL Cholesterol 43 0 - 99 mg/dL    Comment:        Total Cholesterol/HDL:CHD Risk Coronary Heart Disease Risk Table                     Men   Women  1/2 Average Risk   3.4   3.3  Average Risk       5.0   4.4  2 X Average Risk   9.6   7.1  3 X Average Risk  23.4   11.0        Use the calculated Patient Ratio above and the CHD Risk Table to determine the patient's CHD Risk.        ATP III CLASSIFICATION (LDL):  <100     mg/dL   Optimal  899-870  mg/dL   Near or Above                    Optimal  130-159  mg/dL   Borderline  839-810  mg/dL   High  >809     mg/dL   Very High Performed at [facility], [address], [place], [place] [address]   CBC     Status: Abnormal   Collection Time: 04/05/24  3:54 AM  Result Value Ref Range   WBC 9.5 4.0 - 10.5 K/uL   RBC 3.17 (L) 3.87 - 5.11 MIL/uL   Hemoglobin 9.7 (L) 12.0 - 15.0 g/dL   HCT 71.3 (L) 63.9 - 53.9 %   MCV 90.2 80.0 - 100.0 fL   MCH 30.6 26.0 - 34.0 pg   MCHC 33.9 30.0 - 36.0 g/dL   RDW 82.6 (H) 88.4 - 84.4 %   Platelets 161 150 - 400 K/uL   nRBC 0.0 0.0 - 0.2 %    Comment: Performed at [facility], [address] [place], [place] [address]   TSH     Status: None   Collection Time: 04/05/24  3:54 AM  Result Value Ref Range   TSH 1.379 0.350 - 4.500 uIU/mL    Comment: Performed by a 3rd Generation assay with a functional sensitivity of <=0.01 uIU/mL. Performed at [facility], [address], [place], [place] [address]   CK     Status: Abnormal   Collection Time: 04/05/24  3:54 AM  Result Value Ref Range   Total CK 1,107 (H) 38 - 234 U/L    Comment: Performed at [facility], [address], [place], [place] [address]  Glucose, capillary     Status: Abnormal   Collection Time: 04/05/24  6:53 AM  Result Value Ref Range   Glucose-Capillary 169 (H) 70 - 99 mg/dL    Comment: Glucose reference range applies only to samples taken after fasting for at least 8 hours.  Glucose, capillary     Status: Abnormal   Collection Time: 04/05/24 11:16 AM  Result Value Ref Range   Glucose-Capillary 209 (H) 70 - 99 mg/dL    Comment: Glucose reference range applies only to samples taken after fasting for at least 8 hours.  Glucose, capillary     Status: Abnormal   Collection Time: 04/05/24  3:43 PM  Result Value Ref Range   Glucose-Capillary 262 (H) 70 - 99 mg/dL    Comment: Glucose reference range applies only to samples taken after fasting for at least 8 hours.  Glucose, capillary     Status: Abnormal   Collection Time: 04/05/24  9:35 PM  Result Value Ref Range   Glucose-Capillary 235 (H) 70 - 99 mg/dL    Comment: Glucose reference range applies only to samples taken after fasting for at least 8 hours.  Glucose, capillary     Status: Abnormal   Collection Time: 04/06/24 12:45 AM  Result Value Ref Range   Glucose-Capillary 146 (H) 70 - 99 mg/dL    Comment: Glucose reference range applies only to samples taken after fasting for at least 8 hours.  Basic metabolic panel     Status: Abnormal   Collection Time: 04/06/24 12:58 AM  Result Value Ref Range   Sodium 139 135 - 145 mmol/L   Potassium 3.8 3.5 - 5.1 mmol/L   Chloride  104 98 - 111 mmol/L   CO2 26 22 - 32 mmol/L   Glucose, Bld 159 (H) 70 - 99 mg/dL    Comment: Glucose reference range applies only to samples taken after fasting for at least 8 hours.   BUN 24 (H) 8 - 23 mg/dL   Creatinine, Ser 8.49 (H) 0.44 - 1.00 mg/dL   Calcium  7.8 (L) 8.9 - 10.3 mg/dL   GFR, Estimated 35 (L) >60 mL/min    Comment: (NOTE) Calculated using the CKD-EPI Creatinine Equation (2021)    Anion gap 9 5 - 15    Comment: Performed at [facility], [address] [place], [place] [address]  CK     Status: Abnormal   Collection Time: 04/06/24 12:58 AM  Result Value Ref Range   Total CK 903 (H) 38 - 234 U/L    Comment: Performed at [facility], [address], [place], [place] [address]  CBC     Status: Abnormal   Collection Time: 04/06/24 12:58 AM  Result Value Ref Range   WBC 9.0 4.0 - 10.5 K/uL   RBC 2.89 (L) 3.87 - 5.11 MIL/uL   Hemoglobin 8.7 (L) 12.0 - 15.0 g/dL   HCT 74.1 (L) 63.9 -  46.0 %   MCV 89.3 80.0 - 100.0 fL   MCH 30.1 26.0 - 34.0 pg   MCHC 33.7 30.0 - 36.0 g/dL   RDW 83.7 (H) 88.4 - 84.4 %   Platelets 165 150 - 400 K/uL   nRBC 0.0 0.0 - 0.2 %    Comment: Performed at [facility], [address] [place], [place] [address]  Glucose, capillary     Status: Abnormal   Collection Time: 04/06/24  7:33 AM  Result Value Ref Range   Glucose-Capillary 111 (H) 70 - 99 mg/dL    Comment: Glucose reference range applies only to samples taken after fasting for at least 8 hours.  Glucose, capillary     Status: Abnormal   Collection Time: 04/06/24 11:39 AM  Result Value Ref Range   Glucose-Capillary 245 (H) 70 - 99 mg/dL    Comment: Glucose reference range applies only to samples taken after fasting for at least 8 hours.   *Note: Due to a large number of results and/or encounters for the requested time period, some results have not been displayed. A complete set of results can be found in Results Review.   ECHOCARDIOGRAM COMPLETE Result Date:  04/04/2024    ECHOCARDIOGRAM REPORT   Patient Name:   Mikalah JONELLE [facility] Date of Exam: 04/04/2024 Medical Rec #:  [id number]   Height:       58.0 in Accession #:    [id number]  Weight:       120.0 lb Date of Birth:  1941/02/26  BSA:          1.466 m Patient Age:    83 years    BP:           144/65 mmHg Patient Gender: F           HR:           68 bpm. Exam Location:  Inpatient Procedure: 2D Echo (Both Spectral and Color Flow Doppler were utilized during            procedure). Indications:    abnormal ecg  History:        Patient has no prior history of Echocardiogram examinations.                 CAD, PAD and metastatic cancer. chronic kidney disease; Risk                 Factors:Hypertension, Dyslipidemia and Diabetes.  Sonographer:    Tinnie Barefoot RDCS Referring Phys: [phone] PAULA B SIMPSON IMPRESSIONS  1. Left ventricular ejection fraction, by estimation, is 50 to 55%. The left ventricle has low normal function. The left ventricle has no regional wall motion abnormalities. There is mild left ventricular hypertrophy. Left ventricular diastolic parameters are indeterminate.  2. Right ventricular systolic function is normal. The right ventricular size is normal. Tricuspid regurgitation signal is inadequate for assessing PA pressure.  3. The mitral valve is normal in structure. Trivial mitral valve regurgitation. No evidence of mitral stenosis.  4. The aortic valve is tricuspid. Aortic valve regurgitation is not visualized. Aortic valve sclerosis is present, with no evidence of aortic valve stenosis.  5. The inferior vena cava is normal in size with greater than 50% respiratory variability, suggesting right atrial pressure of 3 mmHg. FINDINGS  Left Ventricle: Left ventricular ejection fraction, by estimation, is 50 to 55%. The left ventricle has low normal function. The left ventricle has no regional wall motion abnormalities. The left ventricular internal  cavity size was normal in size. There is mild left ventricular  hypertrophy. Left ventricular diastolic parameters are indeterminate. Right Ventricle: The right ventricular size is normal. No increase in right ventricular wall thickness. Right ventricular systolic function is normal. Tricuspid regurgitation signal is inadequate for assessing PA pressure. Left Atrium: Left atrial size was normal in size. Right Atrium: Right atrial size was normal in size. Pericardium: There is no evidence of pericardial effusion. Mitral Valve: The mitral valve is normal in structure. Trivial mitral valve regurgitation. No evidence of mitral valve stenosis. Tricuspid Valve: The tricuspid valve is normal in structure. Tricuspid valve regurgitation is trivial. Aortic Valve: The aortic valve is tricuspid. Aortic valve regurgitation is not visualized. Aortic valve sclerosis is present, with no evidence of aortic valve stenosis. Pulmonic Valve: The pulmonic valve was not well visualized. Pulmonic valve regurgitation is trivial. Aorta: The aortic root and ascending aorta are structurally normal, with no evidence of dilitation. Venous: The inferior vena cava is normal in size with greater than 50% respiratory variability, suggesting right atrial pressure of 3 mmHg. IAS/Shunts: The interatrial septum was not well visualized.  LEFT VENTRICLE PLAX 2D LVIDd:         4.30 cm     Diastology LVIDs:         3.20 cm     LV e' medial:    5.11 cm/s LV PW:         0.90 cm     LV E/e' medial:  13.6 LV IVS:        1.00 cm     LV e' lateral:   7.72 cm/s LVOT diam:     2.00 cm     LV E/e' lateral: 9.0 LV SV:         58 LV SV Index:   40 LVOT Area:     3.14 cm LV IVRT:       120 msec  LV Volumes (MOD) LV vol d, MOD A2C: 64.3 ml LV vol d, MOD A4C: 56.9 ml LV vol s, MOD A2C: 27.8 ml LV vol s, MOD A4C: 29.3 ml LV SV MOD A2C:     36.5 ml LV SV MOD A4C:     56.9 ml LV SV MOD BP:      31.8 ml RIGHT VENTRICLE             IVC RV Basal diam:  2.60 cm     IVC diam: 1.50 cm RV S prime:     16.30 cm/s TAPSE (M-mode): 1.7 cm       PULMONARY VEINS                             Diastolic Velocity: 44.60 cm/s                             S/D Velocity:       1.30                             Systolic Velocity:  56.10 cm/s LEFT ATRIUM             Index        RIGHT ATRIUM           Index LA diam:        3.20 cm 2.18 cm/m   RA Area:  13.40 cm LA Vol (A2C):   33.0 ml 22.51 ml/m  RA Volume:   30.00 ml  20.47 ml/m LA Vol (A4C):   45.1 ml 30.77 ml/m LA Biplane Vol: 39.4 ml 26.88 ml/m  AORTIC VALVE LVOT Vmax:   84.60 cm/s LVOT Vmean:  53.400 cm/s LVOT VTI:    0.185 m  AORTA Ao Root diam: 3.30 cm Ao Asc diam:  3.20 cm MITRAL VALVE MV Area (PHT): 3.17 cm    SHUNTS MV Decel Time: 239 msec    Systemic VTI:  0.18 m MR Peak grad: 90.6 mmHg    Systemic Diam: 2.00 cm MR Mean grad: 61.0 mmHg MR Vmax:      476.00 cm/s MR Vmean:     373.0 cm/s MV E velocity: 69.70 cm/s MV A velocity: 71.00 cm/s MV E/A ratio:  0.98 Lonni Nanas MD Electronically signed by Lonni Nanas MD Signature Date/Time: 04/04/2024/4:09:35 PM    Final       Blood pressure (!) 141/61, pulse 73, temperature 98.5 F (36.9 C), temperature source Oral, resp. rate 18, height 4' 9.99 (1.473 m), weight 54.4 kg, SpO2 100%.  Medical Problem List and Plan: 1. Functional deficits secondary to Debility due to RLE ischemia s/p fasciotomy x4 with wounds on RLE  -patient may  shower if covers incisions  -ELOS/Goals: ~ 10-14 days- supervision to intermittent mod I  Admit to CIR  Appropriate  2.  Antithrombotics: -DVT/anticoagulation:  Mechanical:  Antiembolism stockings, knee (TED hose) Bilateral lower extremities Pharmaceutical: Heparin   -antiplatelet therapy: Aspirin  and Plavix    3. Pain Management: As needed Tylenol , Voltaren , and oxycodone .  4. Mood/Behavior/Sleep: LCSW to follow for evaluation and support when available.   -antipsychotic agents: N/A  -sleep: Melatonin 3 mg nightly  5. Neuropsych/cognition: This patient is capable of making decisions on her  own behalf.  6. Skin/Wound Care: Routine pressure relief measures. -Daily dressing changes to fasciotomies and L groin. Staples present on admission. **Messaged [facility] about management of surgical site dressing changes.   7. Fluids/Electrolytes/Nutrition: Monitor I&O and weight. Follow up labs CBC/CMP     8. Critical limb ischemia: Continue DAPT indefinitely.  Follow-up with [facility] outpatient  9.  PAD: Continue aspirin . Statin being held d/t rhabdomyolysis.   10.  Hypertension: Continue Norvasc  5 mg daily, Coreg  6.25 mg twice daily. Monitor BP per protocol.  11.   Chronic hx of Adrenalectomy 2018: On home Cortef  10 mg daily and 5 mg nightly.   12.  Hypothyroidism: On home Synthroid  75 mcg.   13.  UTI: UA positive on 12/12, no growth on cultures.  On IV Rocephin  EOT 12/17.   10.  T2DM: A1c 10.3-monitor CBG AC/HS with SSI NovoLog , Lantus  5 units twice daily. Q-said received insulin  Rx from Endo, but never got chance to take it.   11. Constipation: LBM 12/15. Reports norm is once weekly with hx of rectocele, takes stool softener daily.   -Order for Miralax  and colace daily. Simethicone prn.    Daphne LOISE Satterfield, NP 04/06/2024   I have personally performed a face to face diagnostic evaluation of this patient and formulated the key components of the plan.  Additionally, I have personally reviewed laboratory data, imaging studies, as well as relevant notes and concur with the physician assistant's documentation above.   The patient's status has not changed from the original H&P.  Any changes in documentation from the acute care chart have been noted above.       [1]  Allergies Allergen Reactions  Doxycycline Nausea And Vomiting   Adhesive [Tape] Other (See Comments)    Tears skin off Paper tape is ok per patient   Cephalosporins Dermatitis    Tolerated cefazolin  and ceftriaxone     Cymbalta [Duloxetine Hcl]    Duloxetine Other (See Comments)    Feel woozy   Nsaids Other (See  Comments)    Pt only has one kidney    Statins Other (See Comments)    Liver/kidney issues.

## 2024-04-06 NOTE — Progress Notes (Signed)
 Inpatient Rehab Admissions Coordinator:    I have a CIR bed for this Pt. Today. RN may call report to [phone].    Pt. To admit to CIR for estimated 10-12 days with the goal of dc home with her daughter, ideally at supervision to mod I level. Daughter confirmed she can come stay with Pt. For 1 week at her home in [place], or Pt. Can come stay with her as long as she needs in daughter's home outside of [place] (Daughter works from home).   Leita Kleine, MS, CCC-SLP Rehab Admissions Coordinator  [phone] (celll) [phone] (office)

## 2024-04-06 NOTE — Progress Notes (Signed)
 Physical Therapy Treatment Patient Details Name: Audrey [name] MRN: [id number] DOB: 1941/01/26 Today's Date: 04/06/2024   History of Present Illness Pt is an 83 y.o. female who presented 04/01/24 for aortogram, R leg angiogram with second and third order cannulation, R peroneal, TP trunk, popliteal artery angioplasty, and initiation of catheter directed thrombolysis secondary to thrombosis of R leg artery stenting. S/p follow-up lysis with R leg angiogram, angioplasty R peroneal artery and tibioperoneal trunk, R superficial femoral artery stent 12/13, after procedure pt developed concerns for compartment syndrome. S/p R leg 4 compartment fasciotomy, L iliofemoral endarterectomy with bovine pericardial patch angioplasty, and L leg angiogram 12/13. 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 Pt requests to use the [facility] requiring up to mod A for transfers due to flexed posture and limited RLE WB tolerance. Pt keeps R foot placed forward, but is able to place it underneath with improved upright posture in static stance. Pt demonstrates improved standing tolerance during pericare. Pt continues to benefit from PT services to progress toward functional mobility goals.     If plan is discharge home, recommend the following: Two people to help with walking and/or transfers;Two people to help with bathing/dressing/bathroom;Assistance with cooking/housework;Assist for transportation;Help with stairs or ramp for entrance   Can travel by private vehicle        Equipment Recommendations  BSC/3in1;Wheelchair (measurements PT);Wheelchair cushion (measurements PT);Hospital bed    Recommendations for Other Services       Precautions / Restrictions Precautions Precautions: Fall Restrictions Weight Bearing Restrictions Per Provider Order: No      Mobility  Bed Mobility Overal bed mobility: Needs Assistance Bed Mobility: Supine to Sit     Supine to sit: Contact guard, HOB elevated, Used rails     General bed mobility comments: increased time    Transfers Overall transfer level: Needs assistance Equipment used: Rolling walker (2 wheels) Transfers: Sit to/from Stand, Bed to chair/wheelchair/BSC Sit to Stand: Min assist   Step pivot transfers: Mod assist       General transfer comment: STS from EOB and BSC with min A for rise and cues for hand placement. Pt maintains flexed posture despite cues and very limited RLE WB tolerance with R foot placed anterior. Pt able to improve upright posture in static stance with cues for increased BUE support Pivot to [facility] and to recliner with assist for balance and RW management.    Ambulation/Gait                   Stairs             Wheelchair Mobility     Tilt Bed    Modified Rankin (Stroke Patients Only)       Balance Overall balance assessment: Needs assistance Sitting-balance support: Bilateral upper extremity supported, Feet supported Sitting balance-Leahy Scale: Good     Standing balance support: Bilateral upper extremity supported, During functional activity, Reliant on assistive device for balance Standing balance-Leahy Scale: Poor Standing balance comment: reliant on RW                            Communication Communication Communication: No apparent difficulties  Cognition Arousal: Alert Behavior During Therapy: WFL for tasks assessed/performed   PT - Cognitive impairments: No family/caregiver present to determine baseline,  Attention                         Following commands: Impaired Following commands impaired: Follows multi-step commands with increased time    Cueing Cueing Techniques: Verbal cues, Tactile cues  Exercises      General Comments        Pertinent Vitals/Pain Pain Assessment Pain  Assessment: 0-10 Pain Score: 5  Pain Location: RLE Pain Descriptors / Indicators: Discomfort, Grimacing, Guarding Pain Intervention(s): Limited activity within patient's tolerance, Monitored during session, Repositioned     PT Goals (current goals can now be found in the care plan section) Acute Rehab PT Goals Patient Stated Goal: to improve PT Goal Formulation: With patient Time For Goal Achievement: 04/18/24 Progress towards PT goals: Progressing toward goals    Frequency    Min 3X/week       AM-PAC PT 6 Clicks Mobility   Outcome Measure  Help needed turning from your back to your side while in a flat bed without using bedrails?: A Little Help needed moving from lying on your back to sitting on the side of a flat bed without using bedrails?: A Little Help needed moving to and from a bed to a chair (including a wheelchair)?: A Lot Help needed standing up from a chair using your arms (e.g., wheelchair or bedside chair)?: A Little Help needed to walk in hospital room?: Total Help needed climbing 3-5 steps with a railing? : Total 6 Click Score: 13    End of Session Equipment Utilized During Treatment: Gait belt Activity Tolerance: Patient limited by pain Patient left: in chair;with call bell/phone within reach;with chair alarm set;with nursing/sitter in room Nurse Communication: Mobility status PT Visit Diagnosis: Unsteadiness on feet (R26.81);Other abnormalities of gait and mobility (R26.89);Muscle weakness (generalized) (M62.81);Difficulty in walking, not elsewhere classified (R26.2);Pain Pain - Right/Left: Left Pain - part of body: Leg     Time: 0832-0905 PT Time Calculation (min) (ACUTE ONLY): 33 min  Charges:    $Therapeutic Activity: 23-37 mins PT General Charges $$ ACUTE PT VISIT: 1 Visit                    Darryle George, PTA Acute Rehabilitation Services Secure Chat Preferred  [phone]    Darryle George 04/06/2024, 9:28 AM

## 2024-04-06 NOTE — Care Management Important Message (Signed)
 Important Message  Patient Details  Name: Audrey [name] MRN: [id number] Date of Birth: August 14, 1940   Important Message Given:  Yes - Medicare IM     Claretta Deed 04/06/2024, 3:05 PM

## 2024-04-06 NOTE — Discharge Summary (Signed)
 Physician Discharge Summary  Patient ID: Audrey [name] MRN: [id number] DOB/AGE: 12/28/1940 83 y.o.  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 female is s/p follow-up lysis study with right peroneal artery angioplasty and right SFA stent. She required a return trip to the operating room for right leg compartment syndrome and loss of signals in the left leg. She underwent 4 compartment fasciotomy on the right and left iliofemoral endarterectomy with patch angioplasty.  Postop course complicated by mild rhabdo and anemia responded to fluid and blood respectively.  Also noted to have UTI to complete 5 days therapy (last day therapy 12/17).  Holding statin in setting of rhabdo.  Had some EKG changes but echo reassuring against cardiac event  Other med adjustments as below.  Biggest issue now is deconditioning and has been recommended to go to acute rehab to optimize functional status prior to going home.  To continue DAPT indefinitely.  Consults: vascular surgery  Discharge Exam: Blood pressure (!) 152/60, pulse 62, temperature 98.5 F (36.9 C), temperature source Oral, resp. rate 18, height 4' 9.99 (1.473 m), weight 54.4 kg, SpO2 100%.  No distress Moves to command but weak Stable perineal excoriation acute on chronic Aox3 Some bruising Lungs clear, heart sounds regular, Ext warm, incision sites look okay, groin sites look okay  Patient Lines/Drains/Airways Status     Active Line/Drains/Airways     Name Placement date Placement time Site Days   Implanted Port 01/30/18 Right Chest 01/30/18  0821  Chest   2258   Closed System Drain --  --  --  --   Urethral Catheter Tad Barrio, RN Latex;Straight-tip 14 Fr. 04/03/24  --  Latex;Straight-tip  3   Wound 04/02/24 1400 Irritant Contact Dermatitis Labia Right;Left;Lateral 04/02/24  1400  Labia  4   Wound 04/03/24 1034 Surgical Closed Surgical Incision Groin Left 04/03/24  1034  Groin  3   Wound 04/03/24 1603 Surgical Closed Surgical Incision Leg Left 04/03/24  1603  Leg  3             Disposition: Acute inpatient rehab   Allergies as of 04/06/2024       Reactions   Doxycycline Nausea And Vomiting   Adhesive [tape] Other (See Comments)   Tears skin off Paper tape is ok per patient   Cephalosporins Dermatitis   Tolerated cefazolin  and ceftriaxone     Cymbalta [duloxetine Hcl]    Duloxetine Other (See Comments)   Feel woozy   Nsaids Other (See Comments)   Pt only has one kidney    Statins Other (See Comments)   Liver/kidney issues.        Medication List     STOP taking these medications    acyclovir  400 MG tablet Commonly known as: ZOVIRAX    atorvastatin  80 MG tablet Commonly known as: LIPITOR   docusate sodium  100 MG capsule Commonly known as: COLACE   lidocaine  4 % cream Commonly known as: LMX   lidocaine  5 % Commonly known as: LIDODERM    lidocaine -prilocaine  cream Commonly known as: EMLA    mupirocin  ointment 2 % Commonly known as: BACTROBAN    pantoprazole  20 MG tablet Commonly known as: Protonix    SSD 1 %  cream Generic drug: silver sulfADIAZINE   traMADol  50 MG tablet Commonly known as: ULTRAM        TAKE these medications    acetaminophen  325 MG tablet Commonly known as: TYLENOL  Take 1-2 tablets (325-650 mg total) by mouth every 4 (four) hours as needed for mild pain (pain score 1-3) (or temp >/= 101 F).   amLODipine  5 MG tablet Commonly known as: NORVASC  Take 5 mg by mouth daily.   aspirin  EC 81 MG tablet Take 1 tablet (81 mg total) by mouth daily. Swallow whole.   carvedilol  6.25  MG tablet Commonly known as: COREG  Take 6.25 mg by mouth 2 (two) times daily.   cefTRIAXone  1 g in sodium chloride  0.9 % 100 mL Inject 1 g into the vein daily.   clobetasol  ointment 0.05 % Commonly known as: TEMOVATE  Apply 1 application  topically 2 (two) times daily. lichen sclerosis   clopidogrel  75 MG tablet Commonly known as: PLAVIX  Take 75 mg by mouth at bedtime.   diclofenac  Sodium 1 % Gel Commonly known as: VOLTAREN  Apply 2 g topically 4 (four) times daily as needed (for pain).   Gerhardt's butt cream Crea Apply 1 Application topically 2 (two) times daily.   heparin  5000 UNIT/ML injection Inject 1 mL (5,000 Units total) into the skin every 8 (eight) hours.   hydrocortisone  5 MG tablet Commonly known as: CORTEF  Take 1 tablet (5 mg total) by mouth at bedtime. What changed: You were already taking a medication with the same name, and this prescription was added. Make sure you understand how and when to take each.   hydrocortisone  10 MG tablet Commonly known as: CORTEF  Take 1 tablet (10 mg total) by mouth daily. Start taking on: April 07, 2024 What changed:  how much to take when to take this additional instructions   insulin  aspart 100 UNIT/ML injection Commonly known as: novoLOG  Inject 0-9 Units into the skin every 4 (four) hours.   insulin  glargine 100 UNIT/ML injection Commonly known as: LANTUS  Inject 0.05 mLs (5 Units total) into the skin 2 (two) times daily.   ipratropium 17 MCG/ACT inhaler Commonly known as: ATROVENT  HFA Inhale 2 puffs into the lungs every 6 (six) hours as needed for wheezing.   levothyroxine  75 MCG tablet Commonly known as: SYNTHROID  Take 75 mcg by mouth daily before breakfast.   melatonin 3 MG Tabs tablet Take 1 tablet (3 mg total) by mouth at bedtime.   ondansetron  4 MG/2ML Soln injection Commonly known as: ZOFRAN  Inject 2 mLs (4 mg total) into the vein every 6 (six) hours as needed for nausea or vomiting.   Oxycodone  HCl  10 MG Tabs Take 1 tablet (10 mg total) by mouth every 4 (four) hours as needed for severe pain (pain score 7-10).   phenol 1.4 % Liqd Commonly known as: CHLORASEPTIC Use as directed 1 spray in the mouth or throat as needed for throat irritation / pain.   repaglinide  0.5 MG tablet Commonly known as: PRANDIN  Take 0.5 mg by mouth 2 (two) times daily.         Signed: Toribio JAYSON Sharps 04/06/2024, 11:50 AM

## 2024-04-06 NOTE — Plan of Care (Signed)

## 2024-04-06 NOTE — Progress Notes (Signed)
 Inpatient Rehabilitation Admission Medication Review by a Pharmacist  A complete drug regimen review was completed for this patient to identify any potential clinically significant medication issues.  High Risk Drug Classes Is patient taking? Indication by Medication  Antipsychotic No   Anticoagulant Yes HSQ- vte ppx  Antibiotic Yes, as an intravenous medication Rocephin - UTI EOT 04/07/2024  Opioid Yes OxyIR- acute pain  Antiplatelet Yes ASA, plavix - cva ppx  Hypoglycemics/insulin  Yes Insulin - DM  Vasoactive Medication Yes Norvasc - HTN Coreg - HTN  Chemotherapy No   Other Yes Cortef - adrenalectomy Synthroid - hypothyroidism Melatonin- sleep     Type of Medication Issue Identified Description of Issue Recommendation(s)  Drug Interaction(s) (clinically significant)     Duplicate Therapy     Allergy     No Medication Administration End Date     Incorrect Dose     Additional Drug Therapy Needed     Significant med changes from prior encounter (inform family/care partners about these prior to discharge).    Other       Clinically significant medication issues were identified that warrant physician communication and completion of prescribed/recommended actions by midnight of the next day:  No   Time spent performing this drug regimen review (minutes):  30    Jaiona Simien BS, PharmD, BCPS Clinical Pharmacist 04/06/2024 2:39 PM  Contact: [phone] after 3 PM

## 2024-04-06 NOTE — Progress Notes (Signed)
 Patient: Audrey [name] is an 83 y.o., female MRN: [id number] DOB: 12/04/1940 Height: 4' 9.99 (147.3 cm) Weight: 54.4 kg   Insurance Information HMO:     PPO:      PCP:      IPA:      80/20: yes     OTHER:  PRIMARY: Medicare Part A and B      Policy#: [id number]      Subscriber: patient CM Name:       Phone#:      Fax#:  Pre-Cert#:       Employer:  Benefits:  Phone #: verified eligibility via [employer] on 04/04/14     Name:  Eff. Date: Part A and B effective 02/21/07     Deduct: $1,676      Out of Pocket Max: NA      Life Max: NA CIR: 100% coverage      SNF: 100% coverage for days 1-20, 80% coverage for days 21-100 Outpatient: 80% coverage     Co-Pay: 20% Home Health: 100% coverage      Co-Pay:  DME: 80% coverage     Co-Pay: 20% Providers: pt's choice SECONDARY: [employer] Medicare Supplement      Policy#: [id number]     Phone#:    Financial Counselor:       Phone#:    The Data Collection Information Summary for patients in Inpatient Rehabilitation Facilities with attached Privacy Act Statement-Health Care Records was provided and verbally reviewed with: Patient   Emergency Contact Information Contact Information       Name Relation Home Work Mobile    [place] Son [phone]   [phone]    helaine madison Benne [phone]   [phone]         Other Contacts       Name Relation Home Work Mobile    [place] Daughter [phone]   [phone]           Current Medical History  Patient Admitting Diagnosis: critical limb ischemia History of Present Illness: Pt is an 83 year old female with medical hx significant for: DM II, GERD, HTN, HLD, hypothyroidism, CAD/MI, adrenal insufficiency, metastatic renal cell carcinoma (adrenal, bone, pulmonary s/p chemo, left nephrectomy and left adrenal resection 2018) currently in remission, PAD,  right SFA stenting (10/2022), atherectomy and stenting of right SFA, popliteal and TP trunk (12/29/23). Pt presented to [facility] on [facility]/02[facility]24 for planned right lower extremity angiogram by Dr. Magda. Found to have thrombosis of right lower extremity arterial stenting. Underwent right peroneal, TP trunk and popliteal artery angioplasty. Pt had follow up lysis study by Dr. Serene on 12/13. Underwent angioplasty to peroneal artery and stent placement of right superficial femoral artery. Post-op pt developed hematoma in left groin with subsequent loss of doppler signals. Also had left groin and right calf pain. Concern for developing compartment syndrome. Underwent 4 compartment right lower extremity fasciotomy and left femoral endarterectomy with patch angioplasty by Dr. Serene on 12/13. Therapy evaluations completed and CIR recommended d/t pt's deficits in functional mobility.    Patient's medical record from [facility] has been reviewed by the rehabilitation admission coordinator and physician.   Past Medical History      Past Medical History:  Diagnosis Date   Anemia     Arthritis      lower back, hips, hands   Biliary stricture (HCC)     Diabetes mellitus (HCC)      type 2    Early cataracts, bilateral  Md just watching   Elevated liver enzymes     Family history of adverse reaction to anesthesia      Daughter hard to wake up   Gallstones     GERD (gastroesophageal reflux disease)      occasional - diet controlled   History of blood transfusion 2018   History of hiatal hernia     HTN (hypertension)     Hyperlipidemia     Hypothyroidism     left renal ca dx'd 2018    renal cancer - left kidney removed, pill chemo x 1 yr   Myocardial infarction (HCC) 1991    no deficits   Peripheral vascular disease     PONV (postoperative nausea and vomiting)     SVD (spontaneous vaginal delivery)      x 3   Wears glasses            Has the patient had major surgery during 100 days prior to admission? Yes   Family History   family history includes Heart attack (age of onset: 21) in her father;  Heart disease (age of onset: 2) in her brother.   Current Medications [Current Medications]  [Current Medications]   Current Facility-Administered Medications:    0.9 %  sodium chloride  infusion, 500 mL, Intravenous, Once PRN, Rhyne, Samantha J, PA-C   0.9 %  sodium chloride  infusion, , Intravenous, Continuous, Gretta Leita SQUIBB, DO, Last Rate: 50 mL/hr at 04/04/24 1300, Infusion Verify at 04/04/24 1300   acetaminophen  (TYLENOL ) tablet 325-650 mg, 325-650 mg, Oral, Q4H PRN, 650 mg at 04/04/24 1422 **OR** acetaminophen  (TYLENOL ) suppository 325-650 mg, 325-650 mg, Rectal, Q4H PRN, Rhyne, Samantha J, PA-C   aspirin  EC tablet 81 mg, 81 mg, Oral, Daily, Antonetta Moccasin B, NP, 81 mg at 04/04/24 0907   cefTRIAXone  (ROCEPHIN ) 1 g in sodium chloride  0.9 % 100 mL IVPB, 1 g, Intravenous, Q24H, Antonetta Moccasin B, NP, Stopped at 04/03/24 2028   Chlorhexidine  Gluconate Cloth 2 % PADS 6 each, 6 each, Topical, Daily, Rhyne, Samantha J, PA-C, 6 each at 04/04/24 1004   clobetasol  cream (TEMOVATE ) 0.05 %, , Topical, BID, Rhyne, Samantha J, PA-C, Given at 04/04/24 1005   clopidogrel  (PLAVIX ) tablet 75 mg, 75 mg, Oral, QHS, Simpson, Moccasin B, NP, 75 mg at 04/03/24 2117   Gerhardt's butt cream, , Topical, BID, Gretta Leita SQUIBB, DO, Given at 04/04/24 1004   heparin  injection 5,000 Units, 5,000 Units, Subcutaneous, Q8H, Gretta Leita SQUIBB, DO, 5,000 Units at 04/04/24 1422   hydrocortisone  (CORTEF ) tablet 10 mg, 10 mg, Oral, Daily, Antonetta Moccasin B, NP, 10 mg at 04/04/24 9093   hydrocortisone  (CORTEF ) tablet 5 mg, 5 mg, Oral, QHS, Antonetta Moccasin B, NP, 5 mg at 04/03/24 2154   HYDROmorphone  (DILAUDID ) injection 2 mg, 2 mg, Intravenous, Q2H PRN, Serene Gaile ORN, MD   insulin  aspart (novoLOG ) injection 0-9 Units, 0-9 Units, Subcutaneous, Q4H, Antonetta Moccasin B, NP, 3 Units at 04/04/24 1123   insulin  glargine (LANTUS ) injection 5 Units, 5 Units, Subcutaneous, BID, Uhlorn, Garett M, RPH, 5 Units at 04/04/24 1123   levothyroxine   (SYNTHROID ) tablet 75 mcg, 75 mcg, Oral, QAC breakfast, Antonetta Moccasin B, NP, 75 mcg at 04/04/24 0556   ondansetron  (ZOFRAN ) injection 4 mg, 4 mg, Intravenous, Q6H PRN, Rhyne, Samantha J, PA-C   oxyCODONE  (Oxy IR/ROXICODONE ) immediate release tablet 10 mg, 10 mg, Oral, Q4H PRN, Serene Gaile ORN, MD, 10 mg at 04/04/24 0907   phenol (CHLORASEPTIC) mouth spray 1 spray, 1 spray,  Mouth/Throat, PRN, Rhyne, Samantha J, PA-C   potassium chloride  SA (KLOR-CON  M) CR tablet 40-60 mEq, 40-60 mEq, Oral, Daily PRN, Rhyne, Samantha J, PA-C   sodium chloride  flush (NS) 0.9 % injection 3 mL, 3 mL, Intravenous, Q12H, Rhyne, Samantha J, PA-C, 3 mL at 04/04/24 1004   sodium chloride  flush (NS) 0.9 % injection 3 mL, 3 mL, Intravenous, PRN, Rhyne, Samantha J, PA-C   Patients Current Diet:  Diet Order                  Diet heart healthy/carb modified Room service appropriate? Yes; Fluid consistency: Thin  Diet effective now                         Precautions / Restrictions Precautions Precautions: Fall Restrictions Weight Bearing Restrictions Per Provider Order: No    Has the patient had 2 or more falls or a fall with injury in the past year? No   Prior Activity Level Community (5-7x/wk): gets out of house ~2-3 days/week; drives   Prior Functional Level Self Care: Did the patient need help bathing, dressing, using the toilet or eating? Independent   Indoor Mobility: Did the patient need assistance with walking from room to room (with or without device)? Independent   Stairs: Did the patient need assistance with internal or external stairs (with or without device)? Unknown (pt avoids steps)   Functional Cognition: Did the patient need help planning regular tasks such as shopping or remembering to take medications? Independent   Patient Information Are you of Hispanic, Latino/a,or Spanish origin?: A. No, not of Hispanic, Latino/a, or Spanish origin What is your race?: A. White Do you need or want  an interpreter to communicate with a doctor or health care staff?: 0. No   Patient's Response To:  Health Literacy and Transportation Is the patient able to respond to health literacy and transportation needs?: Yes Health Literacy - How often do you need to have someone help you when you read instructions, pamphlets, or other written material from your doctor or pharmacy?: Never In the past 12 months, has lack of transportation kept you from medical appointments or from getting medications?: No In the past 12 months, has lack of transportation kept you from meetings, work, or from getting things needed for daily living?: No   Home Assistive Devices / Equipment Home Equipment: Agricultural Consultant (2 wheels), Rollator (4 wheels), [employer] - quad   Prior Device Use: Indicate devices/aids used by the patient prior to current illness, exacerbation or injury? cane   Current Functional Level Cognition   Orientation Level: Oriented X4    Extremity Assessment (includes Sensation/Coordination)   Upper Extremity Assessment: Defer to OT evaluation  Lower Extremity Assessment: RLE deficits/detail, LLE deficits/detail RLE Deficits / Details: hx of neuropathy in feet, but sensation more diminished than usual inferior to R knee; fasciotomy; limited dorsiflexion ROM, PROM to approximately -10', limited by pain; dorsiflexion MMT score of 2+ RLE Sensation: history of peripheral neuropathy, decreased light touch LLE Deficits / Details: hx of peripheral neuropathy LLE Sensation: history of peripheral neuropathy     ADLs         Mobility   Overal bed mobility: Needs Assistance Bed Mobility: Supine to Sit Supine to sit: Min assist, HOB elevated, Used rails General bed mobility comments: Extra time with pt bringing each leg off EOB while pivoting trunk on bed then ascending trunk using rails, with minA to ascend trunk  Transfers   Overall transfer level: Needs assistance Equipment used: Rolling walker (2  wheels) Transfers: Sit to/from Stand, Bed to chair/wheelchair/BSC Sit to Stand: Mod assist Bed to/from chair/wheelchair/BSC transfer type:: Step pivot Step pivot transfers: Max assist General transfer comment: Cued pt to push up from bed and use L leg more than R to reduce R leg pain as able. Pt pulled up on RW instead and did not use R leg at all, modA to power up to stand and balance. Cues needed to extend hips and push down on RW with hands rather than resting forearms on RW. MaxA needed for balance step pivoting to R to recliner as pt leaned posteriorly progressively more and pivoted heel-to on L leg to get to chair. Pt almost sitting prematurely, needing maxA to bring her safely into the chair     Ambulation / Gait / Stairs / Wheelchair Mobility   Ambulation/Gait Ambulation/Gait assistance: Max Chemical Engineer (Feet): 3 Feet Assistive device: Rolling walker (2 wheels) Gait Pattern/deviations: Leaning posteriorly, Trunk flexed (hop-to/pivot) General Gait Details: Cued pt to push down on RW to offload R leg to hop with L but pt opted to be NWB on R leg and instead pivot heel-to on L leg until she reached the chair. She leaned posteriorly throughout and almost sat prematurely, needing maxA for balance and to safely direct her to the chair Gait velocity: reduced Gait velocity interpretation: <1.31 ft/sec, indicative of household ambulator     Posture / Balance Dynamic Sitting Balance Sitting balance - Comments: UE support, CGA Balance Overall balance assessment: Needs assistance Sitting-balance support: Bilateral upper extremity supported, Feet supported Sitting balance-Leahy Scale: Poor Sitting balance - Comments: UE support, CGA Postural control: Posterior lean Standing balance support: Bilateral upper extremity supported, During functional activity, Reliant on assistive device for balance Standing balance-Leahy Scale: Poor Standing balance comment: reliant on RW and maxA      Special considerations/life events  Continuous Drip IV  Rocephin , Skin Erythema/Redness: groin, perineum/bilateral; Wound: Contact deratitis; labia/right, left, lateral; Surgical incision: groin/left; Surgical incision: leg/left, and Diabetic management Lantus  5 units 2x daily; Novolog  0-9 units every 4 hours;     Previous Home Environment (from acute therapy documentation) Living Arrangements: Alone Available Help at Discharge: Family, Friend(s) (pt unsure if can get 24/7 care or not) Type of Home: House Home Layout: Two level, Able to live on main level with bedroom/bathroom (only uses the main floor) Home Access: Stairs to enter Entrance Stairs-Rails: None Entrance Stairs-Number of Steps: 1 (short) Bathroom Shower/Tub: Engineer, Manufacturing Systems: Handicapped height Bathroom Accessibility: Yes How Accessible: Accessible via walker Home Care Services: No Additional Comments: sleeps on a day bed   Discharge Living Setting Does the patient have any problems obtaining your medications?: No   Social/Family/Support Systems   Goals   Decrease burden of Care through IP rehab admission: NA   Possible need for SNF placement upon discharge: Not anticipated   Patient Condition: I have reviewed medical records from [facility], spoken with CM, and patient. I met with patient at the bedside for inpatient rehabilitation assessment.  Patient will benefit from ongoing PT and OT, can actively participate in 3 hours of therapy a day 5 days of the week, and can make measurable gains during the admission.  Patient will also benefit from the coordinated team approach during an Inpatient Acute Rehabilitation admission.  The patient will receive intensive therapy as well as Rehabilitation physician, nursing, social worker, and care management interventions.  Due to bladder management, safety, skin/wound care, disease management, medication administration, pain management, and patient education  the patient requires 24 hour a day rehabilitation nursing.  The patient is currently min-mod A with mobility and basic ADLs.  Discharge setting and therapy post discharge at home with home health is anticipated.  Patient has agreed to participate in the Acute Inpatient Rehabilitation Program and will admit today.   Preadmission Screen Completed By:  Tinnie SHAUNNA Yvone Delayne, 04/04/2024 2:59 PM ______________________________________________________________________   Discussed status with Dr. Cornelio on 04/06/24 at 900 and received approval for admission today.   Admission Coordinator:  Tinnie SHAUNNA Yvone Delayne, CCC-SLP, time 1122/Date 04/06/24    Assessment/Plan: Diagnosis: Debility due to RLE critical limb ischemia with compartment syndrome s/p fasciotomies Does the need for close, 24 hr/day Medical supervision in concert with the patient's rehab needs make it unreasonable for this patient to be served in a less intensive setting? Yes Co-Morbidities requiring supervision/potential complications: DM A1c 10.3; hypothyroidism, RCC in remission s/p L nephrectomy and L adrenal gland on Solucortef, CAD/MI; HTN, HLD, GERD, L groin hematoma Due to bladder management, bowel management, safety, skin/wound care, disease management, medication administration, pain management, and patient education, does the patient require 24 hr/day rehab nursing? Yes Does the patient require coordinated care of a physician, rehab nurse, PT, OT to address physical and functional deficits in the context of the above medical diagnosis(es)? Yes Addressing deficits in the following areas: balance, endurance, locomotion, strength, transferring, bowel/bladder control, bathing, dressing, feeding, grooming, and toileting Can the patient actively participate in an intensive therapy program of at least 3 hrs of therapy 5 days a week? Yes The potential for patient to make measurable gains while on inpatient rehab is good Anticipated functional  outcomes upon discharge from inpatient rehab: supervision and min assist PT, supervision and min assist OT, n/a SLP Estimated rehab length of stay to reach the above functional goals is: 10-14 days Anticipated discharge destination: Home 10. Overall Rehab/Functional Prognosis: good     MD Signature:             Revision History  Date/Time User Provider Type Action  04/06/2024 12:30 PM Cornelio Bouchard, MD Physician Sign  04/06/2024 11:22 AM Cecilie Leita NOVAK, CCC-SLP Rehab Admission Coordinator Share  04/05/2024  2:17 PM Yvone Delayne Tinnie SHAUNNA, CCC-SLP Rehab Admission Coordinator Share   View Details Report

## 2024-04-07 ENCOUNTER — Inpatient Hospital Stay

## 2024-04-07 DIAGNOSIS — R5381 Other malaise: Secondary | ICD-10-CM | POA: Diagnosis not present

## 2024-04-07 LAB — CBC WITH DIFFERENTIAL/PLATELET
Abs Immature Granulocytes: 0.11 K/uL — ABNORMAL HIGH (ref 0.00–0.07)
Basophils Absolute: 0 K/uL (ref 0.0–0.1)
Basophils Relative: 1 %
Eosinophils Absolute: 0.1 K/uL (ref 0.0–0.5)
Eosinophils Relative: 2 %
HCT: 25.3 % — ABNORMAL LOW (ref 36.0–46.0)
Hemoglobin: 8.3 g/dL — ABNORMAL LOW (ref 12.0–15.0)
Immature Granulocytes: 1 %
Lymphocytes Relative: 14 %
Lymphs Abs: 1.1 K/uL (ref 0.7–4.0)
MCH: 30.4 pg (ref 26.0–34.0)
MCHC: 32.8 g/dL (ref 30.0–36.0)
MCV: 92.7 fL (ref 80.0–100.0)
Monocytes Absolute: 1.1 K/uL — ABNORMAL HIGH (ref 0.1–1.0)
Monocytes Relative: 14 %
Neutro Abs: 5.2 K/uL (ref 1.7–7.7)
Neutrophils Relative %: 68 %
Platelets: 176 K/uL (ref 150–400)
RBC: 2.73 MIL/uL — ABNORMAL LOW (ref 3.87–5.11)
RDW: 15.6 % — ABNORMAL HIGH (ref 11.5–15.5)
WBC: 7.7 K/uL (ref 4.0–10.5)
nRBC: 0 % (ref 0.0–0.2)

## 2024-04-07 LAB — MAGNESIUM: Magnesium: 1.6 mg/dL — ABNORMAL LOW (ref 1.7–2.4)

## 2024-04-07 LAB — GLUCOSE, CAPILLARY
Glucose-Capillary: 144 mg/dL — ABNORMAL HIGH (ref 70–99)
Glucose-Capillary: 199 mg/dL — ABNORMAL HIGH (ref 70–99)
Glucose-Capillary: 228 mg/dL — ABNORMAL HIGH (ref 70–99)
Glucose-Capillary: 256 mg/dL — ABNORMAL HIGH (ref 70–99)

## 2024-04-07 LAB — COMPREHENSIVE METABOLIC PANEL WITH GFR
ALT: 35 U/L (ref 0–44)
AST: 75 U/L — ABNORMAL HIGH (ref 15–41)
Albumin: 2.8 g/dL — ABNORMAL LOW (ref 3.5–5.0)
Alkaline Phosphatase: 51 U/L (ref 38–126)
Anion gap: 7 (ref 5–15)
BUN: 25 mg/dL — ABNORMAL HIGH (ref 8–23)
CO2: 29 mmol/L (ref 22–32)
Calcium: 8.1 mg/dL — ABNORMAL LOW (ref 8.9–10.3)
Chloride: 103 mmol/L (ref 98–111)
Creatinine, Ser: 1.44 mg/dL — ABNORMAL HIGH (ref 0.44–1.00)
GFR, Estimated: 36 mL/min — ABNORMAL LOW (ref >60)
Glucose, Bld: 166 mg/dL — ABNORMAL HIGH (ref 70–99)
Potassium: 3.9 mmol/L (ref 3.5–5.1)
Sodium: 139 mmol/L (ref 135–145)
Total Bilirubin: 0.3 mg/dL (ref 0.0–1.2)
Total Protein: 4.6 g/dL — ABNORMAL LOW (ref 6.5–8.1)

## 2024-04-07 LAB — VITAMIN D 25 HYDROXY (VIT D DEFICIENCY, FRACTURES): Vit D, 25-Hydroxy: 8.2 ng/mL — ABNORMAL LOW (ref 30–100)

## 2024-04-07 MED ORDER — L-METHYLFOLATE-B6-B12 3-35-2 MG PO TABS
1.0000 | ORAL_TABLET | Freq: Every day | ORAL | Status: DC
  Administered 2024-04-07 – 2024-04-17 (×11): 1 via ORAL
  Filled 2024-04-07 (×11): qty 1

## 2024-04-07 MED ORDER — LIDOCAINE 5 % EX PTCH
3.0000 | MEDICATED_PATCH | CUTANEOUS | Status: DC
  Administered 2024-04-07 – 2024-04-15 (×6): 3 via TRANSDERMAL
  Filled 2024-04-07 (×9): qty 3

## 2024-04-07 MED ORDER — B COMPLEX-C PO TABS
1.0000 | ORAL_TABLET | Freq: Every day | ORAL | Status: DC
  Administered 2024-04-07 – 2024-04-17 (×11): 1 via ORAL
  Filled 2024-04-07 (×11): qty 1

## 2024-04-07 MED ORDER — INSULIN GLARGINE 100 UNIT/ML ~~LOC~~ SOLN
6.0000 [IU] | Freq: Two times a day (BID) | SUBCUTANEOUS | Status: DC
  Administered 2024-04-07 – 2024-04-08 (×2): 6 [IU] via SUBCUTANEOUS
  Filled 2024-04-07 (×3): qty 0.06

## 2024-04-07 MED ORDER — TERBINAFINE HCL 1 % EX CREA
TOPICAL_CREAM | Freq: Every day | CUTANEOUS | Status: DC
  Administered 2024-04-07: 12:00:00 1 via TOPICAL
  Filled 2024-04-07 (×2): qty 12

## 2024-04-07 MED ORDER — VITAMIN D 25 MCG (1000 UNIT) PO TABS
1000.0000 [IU] | ORAL_TABLET | Freq: Every day | ORAL | Status: DC
  Administered 2024-04-07 – 2024-04-11 (×5): 1000 [IU] via ORAL
  Filled 2024-04-07 (×5): qty 1

## 2024-04-07 NOTE — Plan of Care (Signed)
°  Problem: RH Eating Goal: LTG Patient will perform eating w/assist, cues/equip (OT) Description: LTG: Patient will perform eating with assist, with/without cues using equipment (OT) Flowsheets (Taken 04/07/2024 1241) LTG: Pt will perform eating with assistance level of: Independent with assistive device    Problem: RH Grooming Goal: LTG Patient will perform grooming w/assist,cues/equip (OT) Description: LTG: Patient will perform grooming with assist, with/without cues using equipment (OT) Flowsheets (Taken 04/07/2024 1241) LTG: Pt will perform grooming with assistance level of: Independent with assistive device    Problem: RH Bathing Goal: LTG Patient will bathe all body parts with assist levels (OT) Description: LTG: Patient will bathe all body parts with assist levels (OT) Flowsheets (Taken 04/07/2024 1241) LTG: Pt will perform bathing with assistance level/cueing: Independent with assistive device    Problem: RH Dressing Goal: LTG Patient will perform upper body dressing (OT) Description: LTG Patient will perform upper body dressing with assist, with/without cues (OT). Flowsheets (Taken 04/07/2024 1241) LTG: Pt will perform upper body dressing with assistance level of: Independent with assistive device Goal: LTG Patient will perform lower body dressing w/assist (OT) Description: LTG: Patient will perform lower body dressing with assist, with/without cues in positioning using equipment (OT) Flowsheets (Taken 04/07/2024 1241) LTG: Pt will perform lower body dressing with assistance level of: Independent with assistive device   Problem: RH Toileting Goal: LTG Patient will perform toileting task (3/3 steps) with assistance level (OT) Description: LTG: Patient will perform toileting task (3/3 steps) with assistance level (OT)  Flowsheets (Taken 04/07/2024 1241) LTG: Pt will perform toileting task (3/3 steps) with assistance level: Independent with assistive device   Problem: RH Light  Housekeeping Goal: LTG Patient will perform light housekeeping w/assist (OT) Description: LTG: Patient will perform light housekeeping with assistance, with/without cues (OT). Flowsheets (Taken 04/07/2024 1241) LTG: Pt will perform light housekeeping with assistance level of: Independent with assistive device   Problem: RH Toilet Transfers Goal: LTG Patient will perform toilet transfers w/assist (OT) Description: LTG: Patient will perform toilet transfers with assist, with/without cues using equipment (OT) Flowsheets (Taken 04/07/2024 1241) LTG: Pt will perform toilet transfers with assistance level of: Independent with assistive device   Problem: RH Tub/Shower Transfers Goal: LTG Patient will perform tub/shower transfers w/assist (OT) Description: LTG: Patient will perform tub/shower transfers with assist, with/without cues using equipment (OT) Flowsheets (Taken 04/07/2024 1241) LTG: Pt will perform tub/shower stall transfers with assistance level of: Independent with assistive device

## 2024-04-07 NOTE — Progress Notes (Signed)
 Physical Therapy Note  Patient Details  Name: Audrey [name] MRN: [id number] Date of Birth: 1941/04/19 Today's Date: 04/07/2024    Physical Therapist participated in the interdisciplinary team conference, providing clinical information regarding the patients current status, treatment goals, and weekly focus, including any barriers that need to be addressed. Please see the Inpatient Rehabilitation Team Conference and Plan of Care Update for further details.   Therisa HERO Zaunegger Therisa Stains PT, DPT 04/07/2024, 11:39 AM

## 2024-04-07 NOTE — Consult Note (Signed)
 WOC Nurse Consult Note:  WOC consult performed remotely utilizing imaging and chart review. Reason for Consult:treatment recommendations for right great toe chronic wound?  Wound type: Erythematous R great toe, no open wound visualized on imaging  L forearm and R hand grade 2 skin tear Deep tissue pressure injury to sacrum L/R heel deep tissue pressure injury Pressure Injury POA: Yes Measurement: see nursing flow sheets Wound bed:  R toe: intact erythematous skin R forearm/ R hand: red, moist Sacrum/ R/L heel: deep purple maroon discoloration with intact skin Drainage (amount, consistency, odor) see nursing flow sheets Periwound: intact Dressing procedure/placement/frequency:  R toe: Place silicone foam dressing over R toe, lift each shift to visualize site.  Ensure patient is utilizing properly fitted footwear. L forearm/L hand: Cleanse with NS, allow to air dry.  Cover wound bed with Xeroform and silicone foam dressing, change daily. Sacrum: cleanse with NS, pat dry.  Apply Xeroform and silicone foam dressing, change daily. R/L heel: apply silicone foam dressing, lift    each shift to visualize site.  Off loan bilateral heels with Prevalon boots ([place]son [phone]).              WOC team will not follow patient at this time, please re consult if new needs arise or wound deteriorates.  Thank you,  Doyal Polite, MSN, RN, [facility] WOC Team [phone] (Available Mon-Fri 0700-1500)

## 2024-04-07 NOTE — Progress Notes (Signed)
 Patient ID: Audrey [name], female   DOB: [birth year], 83 y.o.   MRN: [id number]  This SW covering for primary SW, Di'Asia Loreli.   SW met with pt during OT session to provide updates from team conference, and d/c date 12/31 and will likely be at Mod I at time of discharge. She reports concerns about this impacting her transportation home as her child that will be picking er up and states 12/29 is better. SW encouraged her to wait until next team conference and primary SW can revisit with her as well. She was amenable to this. Pt will need assessment completed.   Graeme Jude, MSW, LCSW Office: [phone] Cell: [phone] Fax: [phone]

## 2024-04-07 NOTE — Evaluation (Signed)
 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 (HCC)   Past Medical History:  Past Medical History:  Diagnosis Date   Anemia    Arthritis    lower back, hips, hands   Biliary stricture (HCC)    Diabetes mellitus (HCC)    type 2    Early cataracts, bilateral    Md just watching   Elevated liver enzymes    Family history of adverse reaction to anesthesia    Daughter hard to wake up   Gallstones    GERD (gastroesophageal reflux disease)    occasional - diet controlled   History of blood transfusion 2018   History of hiatal hernia    HTN (hypertension)    Hyperlipidemia    Hypothyroidism    left renal ca dx'd 2018   renal cancer - left kidney removed, pill chemo x 1 yr   Myocardial infarction (HCC) 1991   no deficits   Peripheral vascular disease    PONV (postoperative nausea and vomiting)    SVD (spontaneous vaginal delivery)    x 3   Wears glasses    Past Surgical History:  Past Surgical History:  Procedure Laterality Date   ABDOMINAL AORTOGRAM N/A 12/29/2023   Procedure: ABDOMINAL AORTOGRAM;  Surgeon: Sheree Penne Bruckner, MD;  Location: [facility] INVASIVE CV LAB;  Service: Cardiovascular;  Laterality: N/A;   ABDOMINAL AORTOGRAM W/LOWER EXTREMITY N/A 10/21/2022   Procedure: ABDOMINAL AORTOGRAM W/LOWER EXTREMITY;  Surgeon: Sheree Penne Bruckner, MD;  Location: [facility] INVASIVE CV LAB;  Service: Cardiovascular;  Laterality: N/A;   ABDOMINAL AORTOGRAM W/LOWER EXTREMITY N/A  04/02/2024   Procedure: ABDOMINAL AORTOGRAM W/LOWER EXTREMITY;  Surgeon: Magda Debby SAILOR, MD;  Location: [facility] INVASIVE CV LAB;  Service: Cardiovascular;  Laterality: N/A;   BALLOON DILATION N/A 07/23/2018   Procedure: BALLOON DILATION;  Surgeon: Wilhelmenia Aloha Raddle., MD;  Location: [facility] ENDOSCOPY;  Service: Gastroenterology;  Laterality: N/A;   BILIARY BRUSHING  08/10/2018   Procedure: BILIARY BRUSHING;  Surgeon: Wilhelmenia Aloha Raddle., MD;  Location: [facility] ENDOSCOPY;  Service: Gastroenterology;;   BILIARY BRUSHING  11/30/2018   Procedure: BILIARY BRUSHING;  Surgeon: Wilhelmenia Aloha Raddle., MD;  Location: [facility] ENDOSCOPY;  Service: Gastroenterology;;   BILIARY DILATION  08/10/2018   Procedure: BILIARY DILATION;  Surgeon: Wilhelmenia Aloha Raddle., MD;  Location: [facility] ENDOSCOPY;  Service: Gastroenterology;;   BILIARY DILATION  08/27/2018   Procedure: BILIARY DILATION;  Surgeon: Wilhelmenia Aloha Raddle., MD;  Location: [facility] ENDOSCOPY;  Service: Gastroenterology;;   BILIARY DILATION  01/24/2020   Procedure: BILIARY DILATION;  Surgeon: Wilhelmenia Aloha Raddle., MD;  Location: [facility] ENDOSCOPY;  Service: Gastroenterology;;   BILIARY STENT PLACEMENT  08/10/2018   Procedure: BILIARY STENT PLACEMENT;  Surgeon: Wilhelmenia Aloha Raddle., MD;  Location: [facility] ENDOSCOPY;  Service: Gastroenterology;;   BILIARY STENT PLACEMENT  08/27/2018   Procedure: BILIARY STENT PLACEMENT;  Surgeon: Wilhelmenia Aloha Raddle., MD;  Location: [facility] ENDOSCOPY;  Service: Gastroenterology;;   BILIARY STENT PLACEMENT  11/30/2018   Procedure: BILIARY STENT PLACEMENT;  Surgeon: Wilhelmenia Aloha Raddle., MD;  Location:  [facility] ENDOSCOPY;  Service: Gastroenterology;;   BIOPSY  07/23/2018   Procedure: BIOPSY;  Surgeon: Wilhelmenia Aloha Raddle., MD;  Location: [facility] ENDOSCOPY;  Service: Gastroenterology;;   BIOPSY  01/24/2020   Procedure: BIOPSY;  Surgeon: Wilhelmenia Aloha Raddle., MD;  Location: [facility] ENDOSCOPY;  Service: Gastroenterology;;   CHOLECYSTECTOMY N/A 09/02/2018    Procedure: LAPAROSCOPIC CHOLECYSTECTOMY;  Surgeon: Aron Shoulders, MD;  Location: [facility] OR;  Service: General;  Laterality: N/A;   COLONOSCOPY     normal    ENDARTERECTOMY FEMORAL Left 04/03/2024   Procedure: ENDARTERECTOMY, FEMORAL;  Surgeon: Serene Gaile ORN, MD;  Location: [facility] OR;  Service: Vascular;  Laterality: Left;   ENDOSCOPIC RETROGRADE CHOLANGIOPANCREATOGRAPHY (ERCP) WITH PROPOFOL  N/A 08/10/2018   Procedure: ENDOSCOPIC RETROGRADE CHOLANGIOPANCREATOGRAPHY (ERCP) WITH PROPOFOL ;  Surgeon: Wilhelmenia Aloha Raddle., MD;  Location: [name] ENDOSCOPY;  Service: Gastroenterology;  Laterality: N/A;   ENDOSCOPIC RETROGRADE CHOLANGIOPANCREATOGRAPHY (ERCP) WITH PROPOFOL  N/A 11/30/2018   Procedure: ENDOSCOPIC RETROGRADE CHOLANGIOPANCREATOGRAPHY (ERCP) WITH PROPOFOL ;  Surgeon: Wilhelmenia Aloha Raddle., MD;  Location: [facility] ENDOSCOPY;  Service: Gastroenterology;  Laterality: N/A;   ENDOSCOPIC RETROGRADE CHOLANGIOPANCREATOGRAPHY (ERCP) WITH PROPOFOL  N/A 01/24/2020   Procedure: ENDOSCOPIC RETROGRADE CHOLANGIOPANCREATOGRAPHY (ERCP) WITH PROPOFOL ;  Surgeon: Wilhelmenia Aloha Raddle., MD;  Location: [facility] ENDOSCOPY;  Service: Gastroenterology;  Laterality: N/A;   ERCP N/A 07/23/2018   Procedure: ENDOSCOPIC RETROGRADE CHOLANGIOPANCREATOGRAPHY (ERCP);  Surgeon: Wilhelmenia Aloha Raddle., MD;  Location: [facility] ENDOSCOPY;  Service: Gastroenterology;  Laterality: N/A;   ERCP N/A 08/27/2018   Procedure: ENDOSCOPIC RETROGRADE CHOLANGIOPANCREATOGRAPHY (ERCP);  Surgeon: Wilhelmenia Aloha Raddle., MD;  Location: [facility] ENDOSCOPY;  Service: Gastroenterology;  Laterality: N/A;   ESOPHAGOGASTRODUODENOSCOPY N/A 01/24/2020   Procedure: ESOPHAGOGASTRODUODENOSCOPY (EGD);  Surgeon: Wilhelmenia Aloha Raddle., MD;  Location: [facility] ENDOSCOPY;  Service: Gastroenterology;  Laterality: N/A;   ESOPHAGOGASTRODUODENOSCOPY (EGD) WITH PROPOFOL  N/A 08/27/2018   Procedure: ESOPHAGOGASTRODUODENOSCOPY (EGD) WITH PROPOFOL ;  Surgeon: Wilhelmenia Aloha Raddle., MD;  Location: [facility] ENDOSCOPY;   Service: Gastroenterology;  Laterality: N/A;   EUS N/A 08/27/2018   Procedure: ESOPHAGEAL ENDOSCOPIC ULTRASOUND (EUS) RADIAL;  Surgeon: Wilhelmenia Aloha Raddle., MD;  Location: [facility] ENDOSCOPY;  Service: Gastroenterology;  Laterality: N/A;   FINE NEEDLE ASPIRATION  08/27/2018   Procedure: FINE NEEDLE ASPIRATION (FNA) LINEAR;  Surgeon: Wilhelmenia Aloha Raddle., MD;  Location: [facility] ENDOSCOPY;  Service: Gastroenterology;;   QUILLIAN EXPOSURE Left 04/03/2024   Procedure: GROIN EXPOSURE;  Surgeon: Serene Gaile ORN, MD;  Location: [facility] OR;  Service: Vascular;  Laterality: Left;   INCISIONAL HERNIA REPAIR N/A 06/23/2020   Procedure: OPEN INCISIONAL HERNIA REPAIR WITH MESH;  Surgeon: Lyndel Deward PARAS, MD;  Location: [facility] ORS;  Service: General;  Laterality: N/A;  ROOM 2 STARTING AT 11:00AM FOR 90 MIN   IR IMAGING GUIDED PORT INSERTION  01/08/2018   LAPAROSCOPIC NEPHRECTOMY Left 08/01/2016   Procedure: LAPAROSCOPIC  RADICAL NEPHRECTOMY/ REPAIR OF UMBILICAL HERNIA;  Surgeon: Gretel Ferrara, MD;  Location: [facility] ORS;  Service: Urology;  Laterality: Left;   LOWER EXTREMITY ANGIOGRAM  04/03/2024   Procedure: GERALYN, LOWER EXTREMITY;  Surgeon: Serene Gaile ORN, MD;  Location: [facility] OR;  Service: Vascular;;   LOWER EXTREMITY ANGIOGRAM Left 04/03/2024   Procedure: GERALYN, LOWER EXTREMITY WITH PERONEAL AND TRUNK BALLOOON ANGIOPLASTY;  Surgeon: Serene Gaile ORN, MD;  Location: [facility] OR;  Service: Vascular;  Laterality: Left;   LOWER EXTREMITY ANGIOGRAPHY N/A 12/29/2023   Procedure: Lower Extremity Angiography;  Surgeon: Sheree Penne Bruckner, MD;  Location: [facility] INVASIVE CV LAB;  Service: Cardiovascular;  Laterality: N/A;   LOWER EXTREMITY INTERVENTION N/A 12/29/2023   Procedure: LOWER EXTREMITY  INTERVENTION;  Surgeon: Sheree Penne Bruckner, MD;  Location: [facility] INVASIVE CV LAB;  Service: Cardiovascular;  Laterality: N/A;   LOWER EXTREMITY INTERVENTION N/A 04/02/2024   Procedure: LOWER EXTREMITY INTERVENTION;  Surgeon: Magda Debby SAILOR, MD;  Location: [facility] INVASIVE CV LAB;  Service: Cardiovascular;  Laterality: N/A;   LYSIS RE-CHECK Left 04/03/2024   Procedure: LYSIS RE-CHECK;  Surgeon: Serene Gaile ORN, MD;  Location: [facility] OR;  Service: Vascular;  Laterality: Left;   PATCH ANGIOPLASTY Left 04/03/2024   Procedure: ANGIOPLASTY, USING PATCH GRAFT, LEFT COMMON FEMORAL ARTERY;  Surgeon: Serene Gaile ORN, MD;  Location: [facility] OR;  Service: Vascular;  Laterality: Left;   PERIPHERAL VASCULAR INTERVENTION  10/21/2022   Procedure: PERIPHERAL VASCULAR INTERVENTION;  Surgeon: Sheree Penne Bruckner, MD;  Location: [facility] INVASIVE CV LAB;  Service: Cardiovascular;;   REMOVAL OF STONES  07/23/2018   Procedure: REMOVAL OF GALL STONES;  Surgeon: Wilhelmenia Aloha Raddle., MD;  Location: [facility] ENDOSCOPY;  Service: Gastroenterology;;   REMOVAL OF STONES  08/10/2018   Procedure: REMOVAL OF STONES;  Surgeon: Wilhelmenia Aloha Raddle., MD;  Location: [facility] ENDOSCOPY;  Service: Gastroenterology;;   REMOVAL OF STONES  08/27/2018   Procedure: REMOVAL OF STONES;  Surgeon: Wilhelmenia Aloha Raddle., MD;  Location: [facility] ENDOSCOPY;  Service: Gastroenterology;;   REMOVAL OF STONES  01/24/2020   Procedure: REMOVAL OF STONES;  Surgeon: Wilhelmenia Aloha Raddle., MD;  Location: [facility] ENDOSCOPY;  Service: Gastroenterology;;   ANNETT  07/23/2018   Procedure: ANNETT;  Surgeon: Mansouraty, Aloha Raddle., MD;  Location: [facility] ENDOSCOPY;  Service: Gastroenterology;;   CLEDA REMOVAL  08/27/2018   Procedure: STENT REMOVAL;  Surgeon: Wilhelmenia Aloha Raddle., MD;  Location: [facility] ENDOSCOPY;  Service: Gastroenterology;;   CLEDA REMOVAL  11/30/2018   Procedure: STENT REMOVAL;  Surgeon: Wilhelmenia Aloha Raddle., MD;  Location: [facility] ENDOSCOPY;  Service: Gastroenterology;;   CLEDA REMOVAL  01/24/2020   Procedure: STENT REMOVAL;  Surgeon: Wilhelmenia Aloha Raddle., MD;  Location: [facility] ENDOSCOPY;  Service: Gastroenterology;;   THIGH FASCIOTOMY Right 04/03/2024   Procedure: FASCIOTOMY, 4 compartment right  lower leg;  Surgeon: Serene Gaile ORN, MD;  Location: [facility] OR;  Service: Vascular;  Laterality: Right;   UPPER GI ENDOSCOPY     x 1   VAGINAL PROLAPSE REPAIR  11/18/2019   [facility]    Assessment & Plan Clinical Impression:  Audrey [name] is an 83 year old R handed female with PMHx of DM II, GERD, HTN, HLD, hypothyroidism, CAD/MI, adrenal insufficiency, metastatic renal cell carcinoma (adrenal, bone, pulmonary s/p chemo, left nephrectomy and left adrenal resection 2018) currently in remission, PAD, right SFA stenting (10/2022), atherectomy and stenting of right SFA, popliteal and TP trunk (12/29/23).  The patient presented to [facility] on 04/01/2024 for a planned admission by [facility]VS for critical limb ischemia on heparin  gtt. after presenting to the office with 4-day history of sudden onset of right foot pain and coldness.  She was taken to the OR on 12/12 for angiogram and found to have thrombosis of the right SFA stents by Dr. Magda.  The patient underwent laser arthrectomy right peroneal, TP trunk and popliteal artery angioplasty with EKOS.     On 12/13 patient went to the OR for lysis check and underwent angioplasty to peroneal artery and stent placement of right SFA by Dr. Serene. Postoperatively the patient developed hematoma in the left groin with subsequent loss of doppler signals. Patient endorsed left groin and right calf pain. Due to concerns for compartment syndrome, patient returned to OR for  a 4 compartment fasciotomy of the right leg, left iliofemoral endarterectomy with pericardial patch angioplasty and left leg angiogram. Aspirin  and Plavix  initiated on 12/14.  Hospital course complicated by mild rhabdomyolysis and anemia that responded to IV fluid and PRBC transfusion. She completed 5 days therapy of antibiotics for UTI.     Prior to arrival the patient was active and independent in the community with use of DME.  She currently lives alone in a two-level house but able to live on the  main level.  She currently requires min to mod assist with mobility and basic ADLs. Therapy evaluations completed due to patient decreased functional mobility was admitted for a comprehensive rehab program..  Patient transferred to CIR on 04/06/2024 .    Patient currently requires min with basic self-care skills secondary to muscle weakness and muscle joint tightness, decreased cardiorespiratoy endurance, and decreased standing balance, decreased postural control, and decreased balance strategies.  Prior to hospitalization, patient could complete ADLs and IADLs with independent .  Patient will benefit from skilled intervention to decrease level of assist with basic self-care skills, increase independence with basic self-care skills, and increase level of independence with iADL prior to discharge home independently with daughter staying for 1 week after D/C.  Anticipate patient will require intermittent supervision and follow up home health.  OT - End of Session Activity Tolerance: Tolerates 30+ min activity with multiple rests Endurance Deficit: Yes Endurance Deficit Description: required frequest rest breaks OT Assessment Rehab Potential (ACUTE ONLY): Good OT Barriers to Discharge: None OT Patient demonstrates impairments in the following area(s): Balance;Pain;Safety;Sensory;Motor;Skin Integrity;Endurance OT Advanced ADL's Functional Problem(s): Light Housekeeping OT Transfers Functional Problem(s): Toilet;Tub/Shower OT Additional Impairment(s): None OT Plan OT Intensity: Minimum of 1-2 x/day, 45 to 90 minutes OT Frequency: 5 out of 7 days OT Duration/Estimated Length of Stay: 10-14 days OT Treatment/Interventions: Balance/vestibular training;Discharge planning;Pain management;Self Care/advanced ADL retraining;Therapeutic Activities;UE/LE Coordination activities;Disease mangement/prevention;Functional mobility training;Patient/family education;Skin care/wound managment;Therapeutic  Exercise;Community reintegration;DME/adaptive equipment instruction;Neuromuscular re-education;Psychosocial support;Splinting/orthotics;UE/LE Strength taining/ROM;Wheelchair propulsion/positioning OT Self Feeding Anticipated Outcome(s): mod I OT Basic Self-Care Anticipated Outcome(s): mod I OT Toileting Anticipated Outcome(s): mod I OT Bathroom Transfers Anticipated Outcome(s): mod I OT Recommendation Recommendations for Other Services: Therapeutic Recreation consult Therapeutic Recreation Interventions: Pet therapy Patient destination: Home Follow Up Recommendations: Home health OT Equipment Recommended: To be determined   OT Evaluation Precautions/Restrictions  Precautions Precautions: Fall Recall of Precautions/Restrictions: Intact Restrictions Weight Bearing Restrictions Per Provider Order: No RLE Weight Bearing Per Provider Order: Weight bearing as tolerated General Chart Reviewed: Yes Response to Previous Treatment: Not applicable Family/Caregiver Present: No Pain Pain Assessment Pain Scale: 0-10 Pain Score: 6  Pain Location: Foot Pain Orientation: Right Home Living/Prior Functioning Home Living Family/patient expects to be discharged to:: Private residence Living Arrangements: Alone Available Help at Discharge: Family, Friend(s) (daughter will spend 1 week with her after D/C) Type of Home: House Home Access: Stairs to enter [employer] of Steps: 1 Entrance Stairs-Rails: None Home Layout: Two level, Able to live on main level with bedroom/bathroom, Full bath on main level Bathroom Shower/Tub: Engineer[employer] Systems: Handicapped height Bathroom Accessibility: Yes Additional Comments: RW, rollator, WC, and quad cane. Uses quad cane out in community, has shower seat and handheld shower  Lives With: Alone IADL History Homemaking Responsibilities: Yes Meal Prep Responsibility: Primary Laundry Responsibility: Primary Cleaning Responsibility:  Primary Bill Paying/Finance Responsibility: Primary Shopping Responsibility: Primary Child Care Responsibility: No Current License: Yes Mode of Transportation: Car, Other (comment) (and SUV) Occupation: Retired Prior [employer] Officer  Level of Independence: Independent with basic ADLs, Independent with transfers, Independent with homemaking with ambulation, Independent with gait, Requires assistive device for independence  Able to Take Stairs?: Yes Driving: Yes Vocation: Retired Leisure: Hobbies-yes (Comment) Vision Baseline Vision/History: 1 Wears glasses Ability to See in Adequate Light: 0 Adequate Patient Visual Report: No change from baseline Vision Assessment?: No apparent visual deficits Perception  Perception: Within Functional Limits Praxis Praxis: WFL Cognition Cognition Overall Cognitive Status: Within Functional Limits for tasks assessed Arousal/Alertness: Awake/alert Orientation Level: Person;Place;Situation Person: Oriented Place: Oriented Situation: Oriented Memory: Appears intact Awareness: Appears intact Problem Solving: Appears intact Safety/Judgment: Appears intact Brief Interview for Mental Status (BIMS) Repetition of Three Words (First Attempt): 3 Temporal Orientation: Year: Correct Temporal Orientation: Month: Accurate within 5 days Temporal Orientation: Day: Correct Recall: Sock: Yes, no cue required Recall: Blue: Yes, no cue required Recall: Bed: Yes, no cue required BIMS Summary Score: 15 Sensation Sensation Light Touch: Impaired by gross assessment Hot/Cold: Not tested Proprioception: Appears Intact Stereognosis: Not tested Additional Comments: 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 Coordination Gross Motor Movements are Fluid and Coordinated: No Fine Motor Movements are Fluid and Coordinated: Yes Coordination and Movement Description: altered balance strategies due to pain in RLE and weakness/deconditioning Motor  Motor Motor: Abnormal  postural alignment and control Motor - Skilled Clinical Observations: pain in RLE and weakness/deconditioning  Trunk/Postural Assessment  Cervical Assessment Cervical Assessment: Exceptions to [facility] (forward head) Thoracic Assessment Thoracic Assessment: Exceptions to [facility] (mild kyphosis) Lumbar Assessment Lumbar Assessment: Exceptions to [facility] (posterior pelvic tilt) Postural Control Postural Control: Deficits on evaluation Righting Reactions: delayed due to pain in RLE Protective Responses: delayed due to pain in RLE  Balance Balance Balance Assessed: Yes Static Sitting Balance Static Sitting - Balance Support: Feet supported;Bilateral upper extremity supported Static Sitting - Level of Assistance: 6: Modified independent (Device/Increase time) Dynamic Sitting Balance Dynamic Sitting - Balance Support: Feet supported;No upper extremity supported Dynamic Sitting - Level of Assistance: 5: Stand by assistance (supervision) Static Standing Balance Static Standing - Balance Support: Left upper extremity supported Static Standing - Level of Assistance: 4: Min assist Dynamic Standing Balance Dynamic Standing - Balance Support: Left upper extremity supported;During functional activity Dynamic Standing - Level of Assistance: 4: Min assist Dynamic Standing - Comments: with transfers Extremity/Trunk Assessment RUE Assessment RUE Assessment: Within Functional Limits LUE Assessment LUE Assessment: Within Functional Limits  Care Tool Care Tool Self Care Eating   Eating Assist Level: Set up assist    Oral Care    Oral Care Assist Level: Set up assist    Bathing         Assist Level: Minimal Assistance - Patient > 75%    Upper Body Dressing(including orthotics)       Assist Level: Set up assist    Lower Body Dressing (excluding footwear)     Assist for lower body dressing: Minimal Assistance - Patient > 75%    Putting on/Taking off footwear     Assist for footwear: Moderate  Assistance - Patient 50 - 74%       Care Tool Toileting Toileting activity   Assist for toileting: Contact Guard/Touching assist     Care Tool Bed Mobility Roll left and right activity   Roll left and right assist level: Supervision/Verbal cueing    Sit to lying activity   Sit to lying assist level: Supervision/Verbal cueing    Lying to sitting on side of bed activity   Lying to sitting on side of bed  assist level: the ability to move from lying on the back to sitting on the side of the bed with no back support.: Supervision/Verbal cueing     Care Tool Transfers Sit to stand transfer   Sit to stand assist level: Contact Guard/Touching assist    Chair/bed transfer   Chair/bed transfer assist level: Contact Guard/Touching assist     Toilet transfer   Assist Level: Contact Guard/Touching assist     Care Tool Cognition  Expression of Ideas and Wants Expression of Ideas and Wants: 4. Without difficulty (complex and basic) - expresses complex messages without difficulty and with speech that is clear and easy to understand  Understanding Verbal and Non-Verbal Content Understanding Verbal and Non-Verbal Content: 4. Understands (complex and basic) - clear comprehension without cues or repetitions   Memory/Recall Ability Memory/Recall Ability : Current season;Staff names and faces;That he or she is in a hospital/hospital unit;Location of own room   Refer to Care Plan for Long Term Goals  SHORT TERM GOAL WEEK 1 OT Short Term Goal 1 (Week 1): Pt will complete bathing at SBA with AE as necessary OT Short Term Goal 2 (Week 1): Pt will complete tub/shower with LRAD at CGA OT Short Term Goal 3 (Week 1): Pt will complete LB dressing with CGA with AE as necessary  Recommendations for other services: Therapeutic Recreation  Pet therapy   Skilled Therapeutic Intervention ADL ADL Where Assessed-Eating: Chair Grooming: Setup Where Assessed-Grooming: Sitting at sink Upper Body Bathing:  Supervision/safety Lower Body Bathing: Minimal assistance Upper Body Dressing: Setup Where Assessed-Upper Body Dressing: Wheelchair Lower Body Dressing: Minimal assistance Where Assessed-Lower Body Dressing: Wheelchair Toileting: Contact guard Where Assessed-Toileting: Bedside Commode Toilet Transfer: Contact guard Toilet Transfer Method: Stand pivot;Ambulating (stand pivot with grab bars to toilet, ambulating with RW back to room ~20 ft with RW with ~50% WB on RLE d/t guarding, VC for RW management) Tub/Shower Transfer: Not assessed Film/video Editor: Not assessed Mobility  Bed Mobility Bed Mobility: Rolling Right;Rolling Left;Supine to Sit Rolling Right: Supervision/verbal cueing Rolling Left: Supervision/Verbal cueing Supine to Sit: Supervision/Verbal cueing Transfers Sit to Stand: Minimal Assistance - Patient > 75% Stand to Sit: Contact Guard/Touching assist  1:1 evaluation and treatment session initiated this date. OT roles, goals and purpose discussed with pt as well as therapy schedule. ADL completed this date with levels of assist listed above. Pt would benefit from skilled OT in IPR setting in order to maximize independence with ADLs upon D/C.   Discharge Criteria: Patient will be discharged from OT if patient refuses treatment 3 consecutive times without medical reason, if treatment goals not met, if there is a change in medical status, if patient makes no progress towards goals or if patient is discharged from hospital.  The above assessment, treatment plan, treatment alternatives and goals were discussed and mutually agreed upon: by patient  Camie Hoe, OTD, OTR/L 04/07/2024, 12:42 PM

## 2024-04-07 NOTE — Progress Notes (Signed)
°   04/06/24 2145  What Happened  Was fall witnessed? Yes  Who witnessed fall? Erminio Cork, RN  Patients activity before fall other (comment) (sitting on bedside commode)  Point of contact arm/shoulder (left)  Was patient injured? Yes (skin tear to left hand and left forearm)  Provider Notification  Provider Name/Title Fidela Ned  Date Provider Notified 04/06/24  Time Provider Notified 2245  Method of Notification Call  Notification Reason Fall  Provider response No new orders  Date of Provider Response 04/06/24  Time of Provider Response 2250  Follow Up  Family notified No - patient refusal  Additional tests No  Simple treatment Dressing  Progress note created (see row info) Yes  Adult Fall Risk Assessment  Risk Factor Category (scoring not indicated) High fall risk per protocol (document High fall risk)  Age 83  Fall History: Fall within 6 months prior to admission 0  Elimination; Bowel and/or Urine Incontinence 0  Elimination; Bowel and/or Urine Urgency/Frequency 0  Medications: includes PCA/Opiates, Anti-convulsants, Anti-hypertensives, Diuretics, Hypnotics, Laxatives, Sedatives, and Psychotropics 5  Patient Care Equipment 2  Mobility-Assistance 2  Mobility-Gait 2  Mobility-Sensory Deficit 0  Altered awareness of immediate physical environment 0  Impulsiveness 0  Lack of understanding of one's physical/cognitive limitations 0  Total Score 14  Patient Fall Risk Level High fall risk  Adult Fall Risk Interventions  Required Bundle Interventions *See Row Information* High fall risk  Required Bundle Interventions *See Row Information* High fall risk  Additional Interventions Use of appropriate toileting equipment (bedpan, BSC, etc.)  Fall intervention(s) refused/Patient educated regarding refusal Nonskid socks;Bed alarm  Screening for Fall Injury Risk (To be completed on HIGH fall risk patients) - Assessing Need for Floor Mats  Risk For Fall Injury- Criteria for Floor  Mats Previous fall this admission  Will Implement Floor Mats Yes  Neurological  Neuro (WDL) X  Level of Consciousness Alert  Orientation Level Oriented X4  Cognition Appropriate at baseline  Speech Clear  R Pupil Size (mm) 2  R Pupil Shape Round  R Pupil Reaction Brisk  L Pupil Size (mm) 2  L Pupil Shape Round  L Pupil Reaction Brisk  RLE Motor Response Purposeful movement  RLE Sensation Pain  RLE Motor Strength 2  Glasgow Coma Scale  Eye Opening 4  Best Verbal Response (NON-intubated) 5  Best Motor Response 6  Glasgow Coma Scale Score 15  Musculoskeletal  Musculoskeletal (WDL) X  Assistive Device BSC;Front wheel walker  Generalized Weakness Yes  Weight Bearing Restrictions Per Provider Order Yes  RLE Weight Bearing Per Provider Order WBAT  Integumentary  Integumentary (WDL) X  Skin Color Appropriate for ethnicity  Skin Condition Dry  Skin Integrity Erythema/redness (skin tear lf hand and lf forearm)  Erythema/Redness Location Groin;Perineum;Arm  Erythema/Redness Location Orientation Bilateral;Left  Skin Turgor Non-tenting

## 2024-04-07 NOTE — Progress Notes (Signed)
 Physical Therapy Session Note  Patient Details  Name: Audrey [name] MRN: [id number] Date of Birth: 04-Mar-1941  Today's Date: 04/07/2024 PT Individual Time: 1400-1456 PT Individual Time Calculation (min): 56 min   Short Term Goals: Week 1:  PT Short Term Goal 1 (Week 1): pt will perform bed<>chair transfers with supervision PT Short Term Goal 2 (Week 1): pt will ambulate 9ft with LRAD and min A PT Short Term Goal 3 (Week 1): pt will transfer sit<>stand with LRAD and CGA  Skilled Therapeutic Interventions/Progress Updates:   Received pt sitting in [facility] with family at bedside. Pt agreeable to PT treatment and reported pain 7/10 in L toe - made toe splint for L great toe and buddy taped with coband for support. Donned non-skid socks and L open-toed shoe with max A. Session with emphasis on functional mobility/transfers, toileting, generalized strengthening and endurance, dynamic standing balance/coordination, and ambulation. Donned pants with max A and stood with RW and min A and required mod A to pull pants over hips. Pt reported urge to void - stood with RW and min A and ambulated in/out of bathroom with RW and min A. Pt resting R elbow on RW despite cues to stand upright in order to take pressure off R foot. Required mod A for clothing management and pt able to void and perform hygiene management without assist.   Pt performed WC mobility 132ft using BUE and supervision with emphasis on UE strength/coordination (pt required min A when turning and navigating around objects). Pt performed seated knee extension 2x10 bilaterally with 2.5lb ankle weight on LLE and hip flexion 2x10 bilaterally with 2.5lb ankle weight on LLE. Returned to room and concluded session with pt sitting in [facility] with all needs within reach.  Therapy Documentation Precautions:  Precautions Precautions: Fall Recall of Precautions/Restrictions: Intact Restrictions Weight Bearing Restrictions Per Provider Order: No RLE Weight  Bearing Per Provider Order: Weight bearing as tolerated  Therapy/Group: Individual Therapy Bradee Common Zaunegger Therisa Stains PT, DPT 04/07/2024, 7:04 AM

## 2024-04-07 NOTE — Plan of Care (Signed)
  Problem: RH PAIN MANAGEMENT Goal: RH STG PAIN MANAGED AT OR BELOW PT'S PAIN GOAL Description: Pain < 4 with prns Outcome: Progressing

## 2024-04-07 NOTE — Patient Care Conference (Signed)
 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 Sven Elks Social Worker Present: Graeme Jude, LCSW Nurse Present: Barnie Ronde, RN PT Present: Therisa Stains, PT OT Present: Monica Peacock, OT SLP Present: Recardo Mole, SLP     Current Status/Progress Goal Weekly Team Focus  Bowel/Bladder   Last bowel movement 04/06/24   Maintain regular bowelments   Maintain regular bowelments    Swallow/Nutrition/ Hydration               ADL's   min to mod assist with mobility and basic ADLs   TBD- Eval in progress   Improve independence and safety with BADL    Mobility   bed mobility supervision, stand<>pivot transfers without AD min A   Mod I  barriers: RLE pain, decreased balance/coordination, global weakness/deconditioning    Communication                Safety/Cognition/ Behavioral Observations               Pain   Pain 8/10 right leg   To keep pain around 3/10   Keep pain under control 3/10    Skin   Skin tears on LUE, bruising BUE, redness on buttocks   Is for skin to begin healing. Pt said she gets bruises all the time at home. She is afraid her buttocks will not heal. Medication is being applied to buttocks.  To begin experiencing healing.      Discharge Planning:  TBA   Team Discussion: Patient admitted  with debility/Rhabdo post right lower extremity ischemia/peroneal artery angioplasty/stent placement complicated by compartmental syndrome requiring fasciotomy. Progress limited by deconditioning and right lower extremity pain.  Patient on target to meet rehab goals: yes, currently needs min - mod assist for ADLs. Completes stand pivot transfers with out an assistive device and min assist. Goals for discharge set for mod I overall.  *See Care Plan and progress notes for long and short-term goals.   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   Current Barriers to Discharge: Decreased caregiver support and Wound care  Possible Resolutions to Barriers: Family education     Medical Summary Current Status: fluctuating hypotension and hypertension, overweight, right lower extremity pain, HTN, diabetic peripheral neuropathy, insomnia  Barriers to Discharge: Medical stability  Barriers to Discharge Comments: fluctuating hypotension and hypertension, overweight, right lower extremity pain, HTN, diabetic peripheral neuropathy, insomnia Possible Resolutions to [employer]: continue to monitor BP TID, provide dietary education, continue oxycodone  prn, continue amlodipine , will schedule for outpatient Qutenza, lidocaine  patches ordered, continue melatonin HS   Continued Need for Acute Rehabilitation Level  of Care: The patient requires daily medical management by a physician with specialized training in physical medicine and rehabilitation for the following reasons: Direction of a multidisciplinary physical rehabilitation program to maximize functional independence : Yes Medical management of patient stability for increased activity during  participation in an intensive rehabilitation regime.: Yes Analysis of laboratory values and/or radiology reports with any subsequent need for medication adjustment and/or medical intervention. : Yes   I attest that I was present, lead the team conference, and concur with the assessment and plan of the team.   Fredericka Sober B 04/07/2024, 12:57 PM

## 2024-04-07 NOTE — Progress Notes (Signed)
 PROGRESS NOTE   Subjective/Complaints: Asks about fungal infection of her toes Hgb decreased to 8.3 Cr decreases to 1.44 Glucose slightly increased  ROS: +fungal infection of toes   Objective:   No results found. Recent Labs    04/06/24 0058 04/07/24 0142  WBC 9.0 7.7  HGB 8.7* 8.3*  HCT 25.8* 25.3*  PLT 165 176   Recent Labs    04/06/24 0058 04/07/24 0142  NA 139 139  K 3.8 3.9  CL 104 103  CO2 26 29  GLUCOSE 159* 166*  BUN 24* 25*  CREATININE 1.50* 1.44*  CALCIUM  7.8* 8.1*    Intake/Output Summary (Last 24 hours) at 04/07/2024 0906 Last data filed at 04/07/2024 0744 Gross per 24 hour  Intake 170 ml  Output --  Net 170 ml     Wound 04/06/24 1500 Pressure Injury Heel Right Deep Tissue Pressure Injury - Purple or maroon localized area of discolored intact skin or blood-filled blister due to damage of underlying soft tissue from pressure and/or shear. (Active)     Wound 04/06/24 1500 Pressure Injury Heel Left;Lateral Deep Tissue Pressure Injury - Purple or maroon localized area of discolored intact skin or blood-filled blister due to damage of underlying soft tissue from pressure and/or shear. (Active)     Wound 04/06/24 1500 Pressure Injury Coccyx Stage 1 -  Intact skin with non-blanchable redness of a localized area usually over a bony prominence. (Active)    Physical Exam: Vital Signs Blood pressure (!) 140/57, pulse (!) 52, temperature 98.1 F (36.7 C), temperature source Oral, resp. rate 18, height 4' 10 (1.473 m), weight 56.5 kg, SpO2 100%. Gen: no distress, normal appearing HEENT: oral mucosa pink and moist, NCAT Cardio: Bradycardic Chest: normal effort, normal rate of breathing Abd: soft, non-distended Ext: no edema Psych: pleasant, normal affect Skin: fungal infection of toes, right calf incision is healing well Neuro: Alert and oriented x3, 4/5 strength in RLE proximally, 3/5 distally-  limited by pain and swelling, strength is otherwise 5/5 except for 0/5 big toe extension on her left foot, decrease sensation in bilateral feet   Assessment/Plan: 1. Functional deficits which require 3+ hours per day of interdisciplinary therapy in a comprehensive inpatient rehab setting. Physiatrist is providing close team supervision and 24 hour management of active medical problems listed below. Physiatrist and rehab team continue to assess barriers to discharge/monitor patient progress toward functional and medical goals  Care Tool:  Bathing              Bathing assist       Upper Body Dressing/Undressing Upper body dressing        Upper body assist      Lower Body Dressing/Undressing Lower body dressing            Lower body assist       Toileting Toileting    Toileting assist       Transfers Chair/bed transfer  Transfers assist           Locomotion Ambulation   Ambulation assist              Walk 10 feet activity   Assist  Walk 50 feet activity   Assist           Walk 150 feet activity   Assist           Walk 10 feet on uneven surface  activity   Assist           Wheelchair     Assist               Wheelchair 50 feet with 2 turns activity    Assist            Wheelchair 150 feet activity     Assist          Blood pressure (!) 140/57, pulse (!) 52, temperature 98.1 F (36.7 C), temperature source Oral, resp. rate 18, height 4' 10 (1.473 m), weight 56.5 kg, SpO2 100%.   Medical Problem List and Plan: 1. Functional deficits secondary to Debility due to RLE ischemia s/p fasciotomy x4 with wounds on RLE             -patient may  shower if covers incisions             -ELOS/Goals: ~ 10-14 days- supervision to intermittent mod I             Chart and therapy notes reviewed, continue CIR, discussed patient's progress with therapy Grounds pass ordered Vitamin  D3/Metanx/Vitamin B/C complex ordered F/u with me or Fidela in clinic within 1 month Would benefit from home health aide upon discharge   -continue Heparin              -antiplatelet therapy: Aspirin  and Plavix    This patient is capable of making decisions on her own behalf.   2. Pain Management: As needed Tylenol , Voltaren , and oxycodone . Lidocaine  patch added for feet   3. Fungal infection of toes: antifungal cream added   5. Neuropsych/cognition:    6. RLE fasciotomy:  -Daily dressing changes to fasciotomies and L groin. Staples present on admission. Asked PA to please consult vascular regarding when staples can be removed and whether our staff can remove them  7. Fluids/Electrolytes/Nutrition: Monitor I&O and weight. Follow up labs CBC/CMP     8. Critical limb ischemia: Continue DAPT indefinitely.  Follow-up with [facility] outpatient   9.  PAD: Continue aspirin . Statin being held d/t rhabdomyolysis.    10.  Hypertension: Continue Norvasc  5 mg daily, Coreg  6.25 mg twice daily. Monitor BP per protocol.   11.   Chronic hx of Adrenalectomy 2018: On home Cortef  10 mg daily and 5 mg nightly.    12.  Hypothyroidism: On home Synthroid  75 mcg.    13.  UTI: UA positive on 12/12, no growth on cultures.  On IV Rocephin  EOT 12/17.    10.  T2DM: A1c 10.3-monitor CBG AC/HS with SSI NovoLog , increase Lantus  to 6U BID. Q-said received insulin  Rx from Endo, but never got chance to take it.    11. Constipation: LBM 12/15. Reports norm is once weekly with hx of rectocele, takes stool softener daily.              -Order for Miralax  and colace daily. Simethicone prn. LBM 12/16, d/c prn milk of mag  LOS: 1 days A FACE TO FACE EVALUATION WAS PERFORMED  Audrey [name] 04/07/2024, 9:06 AM

## 2024-04-07 NOTE — Progress Notes (Signed)
 Inpatient Rehabilitation  Patient information reviewed and entered into eRehab system by [facility]. Karen Kays., CCC/SLP, PPS Coordinator.  Information including medical coding, functional ability and quality indicators will be reviewed and updated through discharge.

## 2024-04-07 NOTE — Evaluation (Signed)
 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 (HCC)   Past Medical History:  Past Medical History:  Diagnosis Date   Anemia    Arthritis    lower back, hips, hands   Biliary stricture (HCC)    Diabetes mellitus (HCC)    type 2    Early cataracts, bilateral    Md just watching   Elevated liver enzymes    Family history of adverse reaction to anesthesia    Daughter hard to wake up   Gallstones    GERD (gastroesophageal reflux disease)    occasional - diet controlled   History of blood transfusion 2018   History of hiatal hernia    HTN (hypertension)    Hyperlipidemia    Hypothyroidism    left renal ca dx'd 2018   renal cancer - left kidney removed, pill chemo x 1 yr   Myocardial infarction (HCC) 1991   no deficits   Peripheral vascular disease    PONV (postoperative nausea and vomiting)    SVD (spontaneous vaginal delivery)    x 3   Wears glasses    Past Surgical History:  Past Surgical History:  Procedure Laterality Date   ABDOMINAL AORTOGRAM N/A 12/29/2023   Procedure: ABDOMINAL AORTOGRAM;  Surgeon: Sheree Penne Bruckner, MD;  Location: [facility] INVASIVE CV LAB;  Service: Cardiovascular;  Laterality: N/A;   ABDOMINAL AORTOGRAM W/LOWER EXTREMITY N/A 10/21/2022   Procedure: ABDOMINAL AORTOGRAM W/LOWER EXTREMITY;  Surgeon: Sheree Penne Bruckner, MD;  Location: [facility] INVASIVE CV LAB;  Service: Cardiovascular;  Laterality: N/A;   ABDOMINAL AORTOGRAM  W/LOWER EXTREMITY N/A 04/02/2024   Procedure: ABDOMINAL AORTOGRAM W/LOWER EXTREMITY;  Surgeon: Magda Debby SAILOR, MD;  Location: [facility] INVASIVE CV LAB;  Service: Cardiovascular;  Laterality: N/A;   BALLOON DILATION N/A 07/23/2018   Procedure: BALLOON DILATION;  Surgeon: Wilhelmenia Aloha Raddle., MD;  Location: [facility] ENDOSCOPY;  Service: Gastroenterology;  Laterality: N/A;   BILIARY BRUSHING  08/10/2018   Procedure: BILIARY BRUSHING;  Surgeon: Wilhelmenia Aloha Raddle., MD;  Location: [facility] ENDOSCOPY;  Service: Gastroenterology;;   BILIARY BRUSHING  11/30/2018   Procedure: BILIARY BRUSHING;  Surgeon: Wilhelmenia Aloha Raddle., MD;  Location: [facility] ENDOSCOPY;  Service: Gastroenterology;;   BILIARY DILATION  08/10/2018   Procedure: BILIARY DILATION;  Surgeon: Wilhelmenia Aloha Raddle., MD;  Location: [facility] ENDOSCOPY;  Service: Gastroenterology;;   BILIARY DILATION  08/27/2018   Procedure: BILIARY DILATION;  Surgeon: Wilhelmenia Aloha Raddle., MD;  Location: [facility] ENDOSCOPY;  Service: Gastroenterology;;   BILIARY DILATION  01/24/2020   Procedure: BILIARY DILATION;  Surgeon: Wilhelmenia Aloha Raddle., MD;  Location: [facility] ENDOSCOPY;  Service: Gastroenterology;;   BILIARY STENT PLACEMENT  08/10/2018   Procedure: BILIARY STENT PLACEMENT;  Surgeon: Wilhelmenia Aloha Raddle., MD;  Location: [facility] ENDOSCOPY;  Service: Gastroenterology;;   BILIARY STENT PLACEMENT  08/27/2018   Procedure: BILIARY STENT PLACEMENT;  Surgeon: Wilhelmenia Aloha Raddle., MD;  Location: [facility] ENDOSCOPY;  Service: Gastroenterology;;   BILIARY STENT PLACEMENT  11/30/2018   Procedure: BILIARY STENT PLACEMENT;  Surgeon: Wilhelmenia Aloha Raddle., MD;  Location: [facility] ENDOSCOPY;  Service: Gastroenterology;;   BIOPSY  07/23/2018   Procedure: BIOPSY;  Surgeon: Wilhelmenia Aloha Raddle., MD;  Location: [facility] ENDOSCOPY;  Service: Gastroenterology;;   BIOPSY  01/24/2020   Procedure: BIOPSY;  Surgeon: Wilhelmenia Aloha Raddle., MD;  Location: [facility] ENDOSCOPY;  Service: Gastroenterology;;   CHOLECYSTECTOMY N/A  09/02/2018   Procedure: LAPAROSCOPIC CHOLECYSTECTOMY;  Surgeon: Aron Shoulders, MD;  Location: [facility] OR;  Service: General;  Laterality: N/A;   COLONOSCOPY     normal    ENDARTERECTOMY FEMORAL Left 04/03/2024   Procedure: ENDARTERECTOMY, FEMORAL;  Surgeon: Serene Gaile ORN, MD;  Location: [facility] OR;  Service: Vascular;  Laterality: Left;   ENDOSCOPIC RETROGRADE CHOLANGIOPANCREATOGRAPHY (ERCP) WITH PROPOFOL  N/A 08/10/2018   Procedure: ENDOSCOPIC RETROGRADE CHOLANGIOPANCREATOGRAPHY (ERCP) WITH PROPOFOL ;  Surgeon: Wilhelmenia Aloha Raddle., MD;  Location: [facility] ENDOSCOPY;  Service: Gastroenterology;  Laterality: N/A;   ENDOSCOPIC RETROGRADE CHOLANGIOPANCREATOGRAPHY (ERCP) WITH PROPOFOL  N/A 11/30/2018   Procedure: ENDOSCOPIC RETROGRADE CHOLANGIOPANCREATOGRAPHY (ERCP) WITH PROPOFOL ;  Surgeon: Wilhelmenia Aloha Raddle., MD;  Location: [facility] ENDOSCOPY;  Service: Gastroenterology;  Laterality: N/A;   ENDOSCOPIC RETROGRADE CHOLANGIOPANCREATOGRAPHY (ERCP) WITH PROPOFOL  N/A 01/24/2020   Procedure: ENDOSCOPIC RETROGRADE CHOLANGIOPANCREATOGRAPHY (ERCP) WITH PROPOFOL ;  Surgeon: Wilhelmenia Aloha Raddle., MD;  Location: [facility] ENDOSCOPY;  Service: Gastroenterology;  Laterality: N/A;   ERCP N/A 07/23/2018   Procedure: ENDOSCOPIC RETROGRADE CHOLANGIOPANCREATOGRAPHY (ERCP);  Surgeon: Wilhelmenia Aloha Raddle., MD;  Location: [facility] ENDOSCOPY;  Service: Gastroenterology;  Laterality: N/A;   ERCP N/A 08/27/2018   Procedure: ENDOSCOPIC RETROGRADE CHOLANGIOPANCREATOGRAPHY (ERCP);  Surgeon: Wilhelmenia Aloha Raddle., MD;  Location: [facility] ENDOSCOPY;  Service: Gastroenterology;  Laterality: N/A;   ESOPHAGOGASTRODUODENOSCOPY N/A 01/24/2020   Procedure: ESOPHAGOGASTRODUODENOSCOPY (EGD);  Surgeon: Wilhelmenia Aloha Raddle., MD;  Location: [facility] ENDOSCOPY;  Service: Gastroenterology;  Laterality: N/A;   ESOPHAGOGASTRODUODENOSCOPY (EGD) WITH PROPOFOL  N/A 08/27/2018   Procedure: ESOPHAGOGASTRODUODENOSCOPY (EGD) WITH PROPOFOL ;  Surgeon: Wilhelmenia Aloha Raddle., MD;  Location: [facility]  ENDOSCOPY;  Service: Gastroenterology;  Laterality: N/A;   EUS N/A 08/27/2018   Procedure: ESOPHAGEAL ENDOSCOPIC ULTRASOUND (EUS) RADIAL;  Surgeon: Wilhelmenia Aloha Raddle., MD;  Location: [facility] ENDOSCOPY;  Service: Gastroenterology;  Laterality: N/A;   FINE NEEDLE ASPIRATION  08/27/2018   Procedure: FINE NEEDLE ASPIRATION (FNA) LINEAR;  Surgeon: Wilhelmenia Aloha Raddle., MD;  Location: [facility] ENDOSCOPY;  Service: Gastroenterology;;   QUILLIAN EXPOSURE Left 04/03/2024   Procedure: GROIN EXPOSURE;  Surgeon: Serene Gaile ORN, MD;  Location: [facility] OR;  Service: Vascular;  Laterality: Left;   INCISIONAL HERNIA REPAIR N/A 06/23/2020   Procedure: OPEN INCISIONAL HERNIA REPAIR WITH MESH;  Surgeon: Lyndel Deward PARAS, MD;  Location: [facility] ORS;  Service: General;  Laterality: N/A;  ROOM 2 STARTING AT 11:00AM FOR 90 MIN   IR IMAGING GUIDED PORT INSERTION  01/08/2018   LAPAROSCOPIC NEPHRECTOMY Left 08/01/2016   Procedure: LAPAROSCOPIC  RADICAL NEPHRECTOMY/ REPAIR OF UMBILICAL HERNIA;  Surgeon: Gretel Ferrara, MD;  Location: [facility] ORS;  Service: Urology;  Laterality: Left;   LOWER EXTREMITY ANGIOGRAM  04/03/2024   Procedure: GERALYN, LOWER EXTREMITY;  Surgeon: Serene Gaile ORN, MD;  Location: [facility] OR;  Service: Vascular;;   LOWER EXTREMITY ANGIOGRAM Left 04/03/2024   Procedure: GERALYN, LOWER EXTREMITY WITH PERONEAL AND TRUNK BALLOOON ANGIOPLASTY;  Surgeon: Serene Gaile ORN, MD;  Location: [facility] OR;  Service: Vascular;  Laterality: Left;   LOWER EXTREMITY ANGIOGRAPHY N/A 12/29/2023   Procedure: Lower Extremity Angiography;  Surgeon: Sheree Penne Bruckner, MD;  Location: [facility] INVASIVE CV LAB;  Service: Cardiovascular;  Laterality: N/A;   LOWER EXTREMITY INTERVENTION N/A 12/29/2023   Procedure: LOWER  EXTREMITY INTERVENTION;  Surgeon: Sheree Penne Bruckner, MD;  Location: [facility] INVASIVE CV LAB;  Service: Cardiovascular;  Laterality: N/A;   LOWER EXTREMITY INTERVENTION N/A 04/02/2024   Procedure: LOWER EXTREMITY INTERVENTION;  Surgeon:  Magda Debby SAILOR, MD;  Location: [facility] INVASIVE CV LAB;  Service: Cardiovascular;  Laterality: N/A;   LYSIS RE-CHECK Left 04/03/2024   Procedure: LYSIS RE-CHECK;  Surgeon: Serene Gaile ORN, MD;  Location: [facility] OR;  Service: Vascular;  Laterality: Left;   PATCH ANGIOPLASTY Left 04/03/2024   Procedure: ANGIOPLASTY, USING PATCH GRAFT, LEFT COMMON FEMORAL ARTERY;  Surgeon: Serene Gaile ORN, MD;  Location: [facility] OR;  Service: Vascular;  Laterality: Left;   PERIPHERAL VASCULAR INTERVENTION  10/21/2022   Procedure: PERIPHERAL VASCULAR INTERVENTION;  Surgeon: Sheree Penne Bruckner, MD;  Location: [facility] INVASIVE CV LAB;  Service: Cardiovascular;;   REMOVAL OF STONES  07/23/2018   Procedure: REMOVAL OF GALL STONES;  Surgeon: Wilhelmenia Aloha Raddle., MD;  Location: [facility] ENDOSCOPY;  Service: Gastroenterology;;   REMOVAL OF STONES  08/10/2018   Procedure: REMOVAL OF STONES;  Surgeon: Wilhelmenia Aloha Raddle., MD;  Location: [facility] ENDOSCOPY;  Service: Gastroenterology;;   REMOVAL OF STONES  08/27/2018   Procedure: REMOVAL OF STONES;  Surgeon: Wilhelmenia Aloha Raddle., MD;  Location: [facility] ENDOSCOPY;  Service: Gastroenterology;;   REMOVAL OF STONES  01/24/2020   Procedure: REMOVAL OF STONES;  Surgeon: Wilhelmenia Aloha Raddle., MD;  Location: [facility] ENDOSCOPY;  Service: Gastroenterology;;   ANNETT  07/23/2018   Procedure: ANNETT;  Surgeon: Mansouraty, Aloha Raddle., MD;  Location: [facility] ENDOSCOPY;  Service: Gastroenterology;;   CLEDA REMOVAL  08/27/2018   Procedure: STENT REMOVAL;  Surgeon: Wilhelmenia Aloha Raddle., MD;  Location: [facility] ENDOSCOPY;  Service: Gastroenterology;;   CLEDA REMOVAL  11/30/2018   Procedure: STENT REMOVAL;  Surgeon: Wilhelmenia Aloha Raddle., MD;  Location: [facility] ENDOSCOPY;  Service: Gastroenterology;;   CLEDA REMOVAL  01/24/2020   Procedure: STENT REMOVAL;  Surgeon: Wilhelmenia Aloha Raddle., MD;  Location: [facility] ENDOSCOPY;  Service: Gastroenterology;;   THIGH FASCIOTOMY Right 04/03/2024   Procedure: FASCIOTOMY, 4  compartment right lower leg;  Surgeon: Serene Gaile ORN, MD;  Location: [facility] OR;  Service: Vascular;  Laterality: Right;   UPPER GI ENDOSCOPY     x 1   VAGINAL PROLAPSE REPAIR  11/18/2019   [facility]    Assessment & Plan Clinical Impression: Patient is a 83 y.o. year old female with PMHx of DM II, GERD, HTN, HLD, hypothyroidism, CAD/MI, adrenal insufficiency, metastatic renal cell carcinoma (adrenal, bone, pulmonary s/p chemo, left nephrectomy and left adrenal resection 2018) currently in remission, PAD, right SFA stenting (10/2022), atherectomy and stenting of right SFA, popliteal and TP trunk (12/29/23).  The patient presented to [facility] on 04/01/2024 for a planned admission by [facility] for critical limb ischemia on heparin  gtt. after presenting to the office with 4-day history of sudden onset of right foot pain and coldness.  She was taken to the OR on 12/12 for angiogram and found to have thrombosis of the right SFA stents by Dr. Magda.  The patient underwent laser arthrectomy right peroneal, TP trunk and popliteal artery angioplasty with EKOS.     On 12/13 patient went to the OR for lysis check and underwent angioplasty to peroneal artery and stent placement of right SFA by Dr. Serene. Postoperatively the patient developed hematoma in the left groin with subsequent loss of doppler signals. Patient endorsed left groin and right calf pain. Due to concerns for compartment syndrome, patient returned to OR for a  4 compartment fasciotomy of the right leg, left iliofemoral endarterectomy with pericardial patch angioplasty and left leg angiogram. Aspirin  and Plavix  initiated on 12/14.  Hospital course complicated by mild rhabdomyolysis and anemia that responded to IV fluid and PRBC transfusion. She completed 5 days therapy of antibiotics for UTI.     Prior to arrival the patient was active and independent in the community with use of DME.  She currently lives alone in a two-level house but able to live  on the main level.  She currently requires min to mod assist with mobility and basic ADLs. Therapy evaluations completed due to patient decreased functional mobility was admitted for a comprehensive rehab program.  Patient currently requires min with mobility secondary to muscle weakness, decreased cardiorespiratoy endurance, and decreased standing balance, decreased postural control, and decreased balance strategies.  Prior to hospitalization, patient was independent  with mobility and lived with Alone in a House home.  Home access is 1Stairs to enter.  Patient will benefit from skilled PT intervention to maximize safe functional mobility, minimize fall risk, and decrease caregiver burden for planned discharge home with intermittent assist.  Anticipate patient will benefit from follow up [facility] at discharge.  PT - End of Session Activity Tolerance: Tolerates 30+ min activity with multiple rests Endurance Deficit: Yes Endurance Deficit Description: required frequest rest breaks PT Assessment Rehab Potential (ACUTE/IP ONLY): Good PT Barriers to Discharge: Home environment access/layout;Decreased caregiver support;Wound Care;Other (comments) PT Barriers to Discharge Comments: pain in RLE, lives alone, 1 STE home, wound care PT Patient demonstrates impairments in the following area(s): Balance;Edema;Endurance;Motor;Nutrition;Pain;Sensory;Skin Integrity PT Transfers Functional Problem(s): Bed Mobility;Bed to Chair;Car;Furniture PT Locomotion Functional Problem(s): Ambulation;Wheelchair Mobility;Stairs PT Plan PT Intensity: Minimum of 1-2 x/day ,45 to 90 minutes PT Frequency: 5 out of 7 days PT Duration Estimated Length of Stay: 2 weeks PT Treatment/Interventions: Ambulation/gait training;Discharge planning;Functional mobility training;Psychosocial support;Therapeutic Activities;Visual/perceptual remediation/compensation;Balance/vestibular training;Disease management/prevention;Neuromuscular  re-education;Skin care/wound management;Therapeutic Exercise;Wheelchair propulsion/positioning;DME/adaptive equipment instruction;Pain management;UE/LE Strength taining/ROM;Splinting/orthotics;Community reintegration;Patient/family education;Stair training;UE/LE Coordination activities PT Transfers Anticipated Outcome(s): Mod I with LRAD PT Locomotion Anticipated Outcome(s): Mod I with LRAD PT Recommendation Recommendations for Other Services: Neuropsych consult;Therapeutic Recreation consult Therapeutic Recreation Interventions: Pet therapy;Stress management Follow Up Recommendations: Home health PT Patient destination: Home Equipment Recommended: To be determined Equipment Details: has RW, quad cane, and rollator   PT Evaluation Precautions/Restrictions Precautions Precautions: Fall Recall of Precautions/Restrictions: Intact Restrictions Weight Bearing Restrictions Per Provider Order: No RLE Weight Bearing Per Provider Order: Weight bearing as tolerated Pain Interference Pain Interference Pain Effect on Sleep: 2. Occasionally Pain Interference with Therapy Activities: 2. Occasionally Pain Interference with Day-to-Day Activities: 2. Occasionally Home Living/Prior Functioning Home Living Available Help at Discharge: Family;Friend(s) (daughter will spend 1 week with her after D/C) Type of Home: House Home Access: Stairs to enter [employer] of Steps: 1 Entrance Stairs-Rails: None Home Layout: Two level;Able to live on main level with bedroom/bathroom;Full bath on main level Bathroom Shower/Tub: [employer]: Handicapped height Bathroom Accessibility: Yes Additional Comments: RW, rollator, WC, and quad cane. Uses quad cane out in community, has shower seat and handheld shower  Lives With: Alone Prior Function Level of Independence: Independent with basic ADLs;Independent with transfers;Independent with homemaking with ambulation;Independent with  gait;Requires assistive device for independence  Able to Take Stairs?: Yes Driving: Yes Vocation: Retired Leisure: Hobbies-yes (Comment) Vision/Perception  Vision - History Ability to See in Adequate Light: 0 Adequate Perception Perception: Within Functional Limits Praxis Praxis: WFL  Cognition Overall Cognitive Status: Within Functional Limits  for tasks assessed Arousal/Alertness: Awake/alert Orientation Level: Oriented X4 Memory: Appears intact Awareness: Appears intact Problem Solving: Appears intact Safety/Judgment: Appears intact Sensation Sensation Light Touch: Impaired by gross assessment Hot/Cold: Not tested Proprioception: Appears Intact Stereognosis: Not tested Additional Comments: 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 Coordination Gross Motor Movements are Fluid and Coordinated: No Fine Motor Movements are Fluid and Coordinated: Yes Coordination and Movement Description: altered balance strategies due to pain in RLE and weakness/deconditioning Motor  Motor Motor: Abnormal postural alignment and control Motor - Skilled Clinical Observations: pain in RLE and weakness/deconditioning  Trunk/Postural Assessment  Cervical Assessment Cervical Assessment: Exceptions to [facility] (forward head) Thoracic Assessment Thoracic Assessment: Exceptions to [facility] (mild kyphosis) Lumbar Assessment Lumbar Assessment: Exceptions to [facility] (posterior pelvic tilt) Postural Control Postural Control: Deficits on evaluation Righting Reactions: delayed due to pain in RLE Protective Responses: delayed due to pain in RLE  Balance Balance Balance Assessed: Yes Static Sitting Balance Static Sitting - Balance Support: Feet supported;Bilateral upper extremity supported Static Sitting - Level of Assistance: 6: Modified independent (Device/Increase time) Dynamic Sitting Balance Dynamic Sitting - Balance Support: Feet supported;No upper extremity supported Dynamic Sitting - Level of Assistance:  5: Stand by assistance (supervision) Static Standing Balance Static Standing - Balance Support: Left upper extremity supported Static Standing - Level of Assistance: 4: Min assist Dynamic Standing Balance Dynamic Standing - Balance Support: Left upper extremity supported;During functional activity Dynamic Standing - Level of Assistance: 4: Min assist Dynamic Standing - Comments: with transfers Extremity Assessment  RLE Assessment RLE Assessment: Exceptions to [facility] General Strength Comments: tested sitting in WC RLE Strength Right Hip Flexion: 3+/5 Right Hip ABduction: 4-/5 Right Hip ADduction: 4-/5 Right Knee Flexion: 3+/5 Right Knee Extension: 3+/5 Right Ankle Dorsiflexion: 3+/5 Right Ankle Plantar Flexion: 3+/5 LLE Assessment LLE Assessment: Exceptions to [facility] General Strength Comments: tested sitting in WC LLE Strength Left Hip Flexion: 4-/5 Left Hip ABduction: 4-/5 Left Hip ADduction: 4-/5 Left Knee Flexion: 4-/5 Left Knee Extension: 4-/5 Left Ankle Dorsiflexion: 4-/5 Left Ankle Plantar Flexion: 4-/5  Care Tool Care Tool Bed Mobility Roll left and right activity   Roll left and right assist level: Supervision/Verbal cueing    Sit to lying activity        Lying to sitting on side of bed activity   Lying to sitting on side of bed assist level: the ability to move from lying on the back to sitting on the side of the bed with no back support.: Supervision/Verbal cueing     Care Tool Transfers Sit to stand transfer   Sit to stand assist level: Minimal Assistance - Patient > 75%    Chair/bed transfer   Chair/bed transfer assist level: Minimal Assistance - Patient > 75%    Car transfer Car transfer activity did not occur: Safety/medical concerns (pain, weakness, decreased balance)        Care Tool Locomotion Ambulation Ambulation activity did not occur: Safety/medical concerns (pain, weakness, decreased balance)        Walk 10 feet activity Walk 10 feet activity  did not occur: Safety/medical concerns (pain, weakness, decreased balance)       Walk 50 feet with 2 turns activity Walk 50 feet with 2 turns activity did not occur: Safety/medical concerns (pain, weakness, decreased balance)      Walk 150 feet activity Walk 150 feet activity did not occur: Safety/medical concerns (pain, weakness, decreased balance)      Walk 10 feet on uneven surfaces activity Walk 10 feet on  uneven surfaces activity did not occur: Safety/medical concerns (pain, weakness, decreased balance)      Stairs Stair activity did not occur: Safety/medical concerns (pain, weakness, decreased balance)        Walk up/down 1 step activity Walk up/down 1 step or curb (drop down) activity did not occur: Safety/medical concerns (pain, weakness, decreased balance)      Walk up/down 4 steps activity Walk up/down 4 steps activity did not occur: Safety/medical concerns (pain, weakness, decreased balance)      Walk up/down 12 steps activity Walk up/down 12 steps activity did not occur: Safety/medical concerns (pain, weakness, decreased balance)      Pick up small objects from floor Pick up small object from the floor (from standing position) activity did not occur: Safety/medical concerns (pain, weakness, decreased balance)      Wheelchair Is the patient using a wheelchair?: Yes Type of Wheelchair: Manual Wheelchair activity did not occur: Safety/medical concerns (pain, weakness)      Wheel 50 feet with 2 turns activity Wheelchair 50 feet with 2 turns activity did not occur: Safety/medical concerns (pain, weakness)    Wheel 150 feet activity Wheelchair 150 feet activity did not occur: Safety/medical concerns (pain, weakness)      Refer to Care Plan for Long Term Goals  SHORT TERM GOAL WEEK 1 PT Short Term Goal 1 (Week 1): pt will perform bed<>chair transfers with supervision PT Short Term Goal 2 (Week 1): pt will ambulate 55ft with LRAD and min A PT Short Term Goal 3 (Week  1): pt will transfer sit<>stand with LRAD and CGA  Recommendations for other services: None   Skilled Therapeutic Intervention Evaluation completed (see details above and below) with education on PT POC and goals and individual treatment initiated with focus on functional mobility/transfers, generalized strengthening and endurance, and dynamic standing balance/coordination. Received pt semi-reclined in bed, pt educated on PT evaluation, CIR policies, and therapy schedule and agreeable. Pt hyperverbal and required ++ time with all mobility. Pt reported pain 5/10 in R foot (premedicated), increasing with weight bearing.   Pt transferred semi-reclined<>sitting L EOB with HOB elevated and use of bedrails with supervision. Pt transferred into WC via stand<>pivot without AD and min A. Removed socks to inspect feet and pt concerned about fungus in toes and requesting to speak with MD - MD notified. Charge nurse notified and present to measure wounds, take pictures, and remove foam dressing pads. Sat in WC at sink and brushed teeth, washed face and hair, and groomed with setup assist. Pt required ++ time to get ready (uses skin creams, makeup, and perfume). Concluded session with pt sitting in [facility] with all needs within reach and safety plan updated.   Mobility Bed Mobility Bed Mobility: Rolling Right;Rolling Left;Supine to Sit Rolling Right: Supervision/verbal cueing Rolling Left: Supervision/Verbal cueing Supine to Sit: Supervision/Verbal cueing Transfers Transfers: Sit to Stand;Stand to Sit;Stand Pivot Transfers Sit to Stand: Minimal Assistance - Patient > 75% Stand to Sit: Contact Guard/Touching assist Stand Pivot Transfers: Minimal Assistance - Patient > 75% Transfer (Assistive device): None Locomotion  Gait Ambulation: No Gait Gait: No Stairs / Additional Locomotion Stairs: No Wheelchair Mobility Wheelchair Mobility: No   Discharge Criteria: Patient will be discharged from PT if patient  refuses treatment 3 consecutive times without medical reason, if treatment goals not met, if there is a change in medical status, if patient makes no progress towards goals or if patient is discharged from hospital.  The above assessment, treatment plan, treatment alternatives and  goals were discussed and mutually agreed upon: by patient  Royanne Warshaw M Zaunegger Jashayla Glatfelter Zaunegger PT, DPT 04/07/2024, 11:55 AM

## 2024-04-07 NOTE — Progress Notes (Signed)
 Occupational Therapy Note  Patient Details  Name: Audrey [name] MRN: [id number] Date of Birth: 1940/06/26  Occupational Therapist participated in the interdisciplinary team conference, providing clinical information regarding the patient's current status, treatment goals, and weekly focus, including any barriers that need to be addressed. Please see the Inpatient Rehabilitation Team Conference and Plan of Care Update for further details.    [name] M 04/07/2024, 11:40 AM

## 2024-04-08 DIAGNOSIS — R5381 Other malaise: Secondary | ICD-10-CM | POA: Diagnosis not present

## 2024-04-08 LAB — CBC WITH DIFFERENTIAL/PLATELET
Abs Immature Granulocytes: 0.45 K/uL — ABNORMAL HIGH (ref 0.00–0.07)
Basophils Absolute: 0.1 K/uL (ref 0.0–0.1)
Basophils Relative: 1 %
Eosinophils Absolute: 0.2 K/uL (ref 0.0–0.5)
Eosinophils Relative: 1 %
HCT: 31.5 % — ABNORMAL LOW (ref 36.0–46.0)
Hemoglobin: 10.3 g/dL — ABNORMAL LOW (ref 12.0–15.0)
Immature Granulocytes: 4 %
Lymphocytes Relative: 14 %
Lymphs Abs: 1.8 K/uL (ref 0.7–4.0)
MCH: 30.3 pg (ref 26.0–34.0)
MCHC: 32.7 g/dL (ref 30.0–36.0)
MCV: 92.6 fL (ref 80.0–100.0)
Monocytes Absolute: 1.1 K/uL — ABNORMAL HIGH (ref 0.1–1.0)
Monocytes Relative: 9 %
Neutro Abs: 8.8 K/uL — ABNORMAL HIGH (ref 1.7–7.7)
Neutrophils Relative %: 71 %
Platelets: 345 K/uL (ref 150–400)
RBC: 3.4 MIL/uL — ABNORMAL LOW (ref 3.87–5.11)
RDW: 15.2 % (ref 11.5–15.5)
WBC: 12.4 K/uL — ABNORMAL HIGH (ref 4.0–10.5)
nRBC: 0 % (ref 0.0–0.2)

## 2024-04-08 LAB — MAGNESIUM: Magnesium: 1.8 mg/dL (ref 1.7–2.4)

## 2024-04-08 LAB — GLUCOSE, CAPILLARY
Glucose-Capillary: 147 mg/dL — ABNORMAL HIGH (ref 70–99)
Glucose-Capillary: 209 mg/dL — ABNORMAL HIGH (ref 70–99)
Glucose-Capillary: 231 mg/dL — ABNORMAL HIGH (ref 70–99)
Glucose-Capillary: 211 mg/dL — ABNORMAL HIGH (ref 70–99)

## 2024-04-08 MED ORDER — HEPARIN SODIUM (PORCINE) 5000 UNIT/ML IJ SOLN
5000.0000 [IU] | Freq: Two times a day (BID) | INTRAMUSCULAR | Status: DC
  Administered 2024-04-08 – 2024-04-14 (×12): 5000 [IU] via SUBCUTANEOUS
  Filled 2024-04-08 (×12): qty 1

## 2024-04-08 MED ORDER — POLYETHYLENE GLYCOL 3350 17 G PO PACK
17.0000 g | PACK | Freq: Every day | ORAL | Status: DC
  Administered 2024-04-09 – 2024-04-16 (×8): 17 g via ORAL
  Filled 2024-04-08 (×9): qty 1

## 2024-04-08 MED ORDER — CHLORHEXIDINE GLUCONATE CLOTH 2 % EX PADS
6.0000 | MEDICATED_PAD | Freq: Two times a day (BID) | CUTANEOUS | Status: DC
  Administered 2024-04-08 – 2024-04-17 (×18): 6 via TOPICAL

## 2024-04-08 MED ORDER — INSULIN GLARGINE 100 UNIT/ML ~~LOC~~ SOLN
7.0000 [IU] | Freq: Two times a day (BID) | SUBCUTANEOUS | Status: DC
  Administered 2024-04-08: 21:00:00 7 [IU] via SUBCUTANEOUS
  Filled 2024-04-08 (×4): qty 0.07

## 2024-04-08 NOTE — Progress Notes (Signed)
 Patient provided documentation obtained from her lawyer with request for code status DNR. Patients wishes have been discussed, DNR form completed in the presence of patient and LCSW Di'Asia. Code status updated.   Audrey Satterfield, FNP

## 2024-04-08 NOTE — Progress Notes (Signed)
 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)  Past Medical History:  Past Medical History:  Diagnosis Date   Anemia    Arthritis    lower back, hips, hands   Biliary stricture (HCC)    Diabetes mellitus (HCC)    type 2    Early cataracts, bilateral    Md just watching   Elevated liver enzymes    Family history of adverse reaction to anesthesia    Daughter hard to wake up   Gallstones    GERD (gastroesophageal reflux disease)    occasional - diet controlled   History of blood transfusion 2018   History of hiatal hernia    HTN (hypertension)    Hyperlipidemia    Hypothyroidism    left renal ca dx'd 2018   renal cancer - left kidney removed, pill chemo x 1 yr   Myocardial infarction (HCC) 1991   no deficits   Peripheral vascular disease    PONV (postoperative nausea and vomiting)    SVD (spontaneous vaginal delivery)    x 3   Wears glasses    Past Surgical History:  Past Surgical History:  Procedure Laterality Date   ABDOMINAL AORTOGRAM N/A 12/29/2023   Procedure: ABDOMINAL AORTOGRAM;  Surgeon: Sheree Penne Bruckner, MD;  Location: [facility] INVASIVE CV LAB;  Service: Cardiovascular;  Laterality: N/A;   ABDOMINAL AORTOGRAM W/LOWER EXTREMITY N/A 10/21/2022   Procedure: ABDOMINAL AORTOGRAM W/LOWER EXTREMITY;  Surgeon: Sheree Penne Bruckner, MD;  Location: [facility] INVASIVE CV LAB;  Service: Cardiovascular;  Laterality: N/A;   ABDOMINAL AORTOGRAM W/LOWER EXTREMITY N/A 04/02/2024   Procedure: ABDOMINAL AORTOGRAM W/LOWER EXTREMITY;  Surgeon: Magda Debby SAILOR, MD;  Location: [facility] INVASIVE CV LAB;  Service: Cardiovascular;  Laterality: N/A;   BALLOON DILATION N/A 07/23/2018   Procedure: BALLOON DILATION;   Surgeon: Wilhelmenia Aloha Raddle., MD;  Location: [facility] ENDOSCOPY;  Service: Gastroenterology;  Laterality: N/A;   BILIARY BRUSHING  08/10/2018   Procedure: BILIARY BRUSHING;  Surgeon: Wilhelmenia Aloha Raddle., MD;  Location: [facility] ENDOSCOPY;  Service: Gastroenterology;;   BILIARY BRUSHING  11/30/2018   Procedure: BILIARY BRUSHING;  Surgeon: Wilhelmenia Aloha Raddle., MD;  Location: [facility] ENDOSCOPY;  Service: Gastroenterology;;   BILIARY DILATION  08/10/2018   Procedure: BILIARY DILATION;  Surgeon: Wilhelmenia Aloha Raddle., MD;  Location: [facility] ENDOSCOPY;  Service: Gastroenterology;;   BILIARY DILATION  08/27/2018   Procedure: BILIARY DILATION;  Surgeon: Wilhelmenia Aloha Raddle., MD;  Location: [facility] ENDOSCOPY;  Service: Gastroenterology;;   BILIARY DILATION  01/24/2020   Procedure: BILIARY DILATION;  Surgeon: Wilhelmenia Aloha Raddle., MD;  Location: [facility] ENDOSCOPY;  Service: Gastroenterology;;   BILIARY STENT PLACEMENT  08/10/2018   Procedure: BILIARY STENT PLACEMENT;  Surgeon: Wilhelmenia Aloha Raddle., MD;  Location: [facility] ENDOSCOPY;  Service: Gastroenterology;;   BILIARY STENT PLACEMENT  08/27/2018   Procedure: BILIARY STENT PLACEMENT;  Surgeon: Wilhelmenia Aloha Raddle., MD;  Location: [facility] ENDOSCOPY;  Service: Gastroenterology;;   BILIARY STENT PLACEMENT  11/30/2018   Procedure: BILIARY STENT PLACEMENT;  Surgeon: Wilhelmenia Aloha Raddle., MD;  Location: [facility] ENDOSCOPY;  Service: Gastroenterology;;   BIOPSY  07/23/2018   Procedure: BIOPSY;  Surgeon: Wilhelmenia Aloha Raddle., MD;  Location: [facility] ENDOSCOPY;  Service: Gastroenterology;;   BIOPSY  01/24/2020   Procedure: BIOPSY;  Surgeon: Wilhelmenia,  Aloha Raddle., MD;  Location: [facility] ENDOSCOPY;  Service: Gastroenterology;;   CHOLECYSTECTOMY N/A 09/02/2018   Procedure: LAPAROSCOPIC CHOLECYSTECTOMY;  Surgeon: Aron Shoulders, MD;  Location: [facility] OR;  Service: General;  Laterality: N/A;   COLONOSCOPY     normal    ENDARTERECTOMY FEMORAL Left 04/03/2024   Procedure: ENDARTERECTOMY, FEMORAL;  Surgeon:  Serene Gaile ORN, MD;  Location: [facility] OR;  Service: Vascular;  Laterality: Left;   ENDOSCOPIC RETROGRADE CHOLANGIOPANCREATOGRAPHY (ERCP) WITH PROPOFOL  N/A 08/10/2018   Procedure: ENDOSCOPIC RETROGRADE CHOLANGIOPANCREATOGRAPHY (ERCP) WITH PROPOFOL ;  Surgeon: Wilhelmenia Aloha Raddle., MD;  Location: [facility] ENDOSCOPY;  Service: Gastroenterology;  Laterality: N/A;   ENDOSCOPIC RETROGRADE CHOLANGIOPANCREATOGRAPHY (ERCP) WITH PROPOFOL  N/A 11/30/2018   Procedure: ENDOSCOPIC RETROGRADE CHOLANGIOPANCREATOGRAPHY (ERCP) WITH PROPOFOL ;  Surgeon: Wilhelmenia Aloha Raddle., MD;  Location: [facility] ENDOSCOPY;  Service: Gastroenterology;  Laterality: N/A;   ENDOSCOPIC RETROGRADE CHOLANGIOPANCREATOGRAPHY (ERCP) WITH PROPOFOL  N/A 01/24/2020   Procedure: ENDOSCOPIC RETROGRADE CHOLANGIOPANCREATOGRAPHY (ERCP) WITH PROPOFOL ;  Surgeon: Wilhelmenia Aloha Raddle., MD;  Location: [facility] ENDOSCOPY;  Service: Gastroenterology;  Laterality: N/A;   ERCP N/A 07/23/2018   Procedure: ENDOSCOPIC RETROGRADE CHOLANGIOPANCREATOGRAPHY (ERCP);  Surgeon: Wilhelmenia Aloha Raddle., MD;  Location: [facility] ENDOSCOPY;  Service: Gastroenterology;  Laterality: N/A;   ERCP N/A 08/27/2018   Procedure: ENDOSCOPIC RETROGRADE CHOLANGIOPANCREATOGRAPHY (ERCP);  Surgeon: Wilhelmenia Aloha Raddle., MD;  Location: [facility] ENDOSCOPY;  Service: Gastroenterology;  Laterality: N/A;   ESOPHAGOGASTRODUODENOSCOPY N/A 01/24/2020   Procedure: ESOPHAGOGASTRODUODENOSCOPY (EGD);  Surgeon: Wilhelmenia Aloha Raddle., MD;  Location: [facility] ENDOSCOPY;  Service: Gastroenterology;  Laterality: N/A;   ESOPHAGOGASTRODUODENOSCOPY (EGD) WITH PROPOFOL  N/A 08/27/2018   Procedure: ESOPHAGOGASTRODUODENOSCOPY (EGD) WITH PROPOFOL ;  Surgeon: Wilhelmenia Aloha Raddle., MD;  Location: [facility] ENDOSCOPY;  Service: Gastroenterology;  Laterality: N/A;   EUS N/A 08/27/2018   Procedure: ESOPHAGEAL ENDOSCOPIC ULTRASOUND (EUS) RADIAL;  Surgeon: Wilhelmenia Aloha Raddle., MD;  Location: [facility] ENDOSCOPY;  Service: Gastroenterology;  Laterality: N/A;   FINE  NEEDLE ASPIRATION  08/27/2018   Procedure: FINE NEEDLE ASPIRATION (FNA) LINEAR;  Surgeon: Wilhelmenia Aloha Raddle., MD;  Location: [facility] ENDOSCOPY;  Service: Gastroenterology;;   QUILLIAN EXPOSURE Left 04/03/2024   Procedure: GROIN EXPOSURE;  Surgeon: Serene Gaile ORN, MD;  Location: [facility] OR;  Service: Vascular;  Laterality: Left;   INCISIONAL HERNIA REPAIR N/A 06/23/2020   Procedure: OPEN INCISIONAL HERNIA REPAIR WITH MESH;  Surgeon: Lyndel Deward PARAS, MD;  Location: [facility] ORS;  Service: General;  Laterality: N/A;  ROOM 2 STARTING AT 11:00AM FOR 90 MIN   IR IMAGING GUIDED PORT INSERTION  01/08/2018   LAPAROSCOPIC NEPHRECTOMY Left 08/01/2016   Procedure: LAPAROSCOPIC  RADICAL NEPHRECTOMY/ REPAIR OF UMBILICAL HERNIA;  Surgeon: Gretel Ferrara, MD;  Location: [facility] ORS;  Service: Urology;  Laterality: Left;   LOWER EXTREMITY ANGIOGRAM  04/03/2024   Procedure: GERALYN, LOWER EXTREMITY;  Surgeon: Serene Gaile ORN, MD;  Location: [facility] OR;  Service: Vascular;;   LOWER EXTREMITY ANGIOGRAM Left 04/03/2024   Procedure: GERALYN, LOWER EXTREMITY WITH PERONEAL AND TRUNK BALLOOON ANGIOPLASTY;  Surgeon: Serene Gaile ORN, MD;  Location: [facility] OR;  Service: Vascular;  Laterality: Left;   LOWER EXTREMITY ANGIOGRAPHY N/A 12/29/2023   Procedure: Lower Extremity Angiography;  Surgeon: Sheree Penne Bruckner, MD;  Location: [facility] INVASIVE CV LAB;  Service: Cardiovascular;  Laterality: N/A;   LOWER EXTREMITY INTERVENTION N/A 12/29/2023   Procedure: LOWER EXTREMITY INTERVENTION;  Surgeon: Sheree Penne Bruckner, MD;  Location: [facility] INVASIVE CV LAB;  Service: Cardiovascular;  Laterality: N/A;   LOWER EXTREMITY INTERVENTION N/A 04/02/2024   Procedure: LOWER EXTREMITY INTERVENTION;  Surgeon: Magda Debby SAILOR, MD;  Location: [facility] INVASIVE CV LAB;  Service: Cardiovascular;  Laterality: N/A;   LYSIS RE-CHECK Left 04/03/2024   Procedure: LYSIS RE-CHECK;  Surgeon: Serene Gaile ORN, MD;  Location: [facility] OR;  Service: Vascular;  Laterality: Left;   PATCH  ANGIOPLASTY Left 04/03/2024   Procedure: ANGIOPLASTY, USING PATCH GRAFT, LEFT COMMON FEMORAL ARTERY;  Surgeon: Serene Gaile ORN, MD;  Location: [facility] OR;  Service: Vascular;  Laterality: Left;   PERIPHERAL VASCULAR INTERVENTION  10/21/2022   Procedure: PERIPHERAL VASCULAR INTERVENTION;  Surgeon: Sheree Penne Bruckner, MD;  Location: [facility] INVASIVE CV LAB;  Service: Cardiovascular;;   REMOVAL OF STONES  07/23/2018   Procedure: REMOVAL OF GALL STONES;  Surgeon: Wilhelmenia Aloha Raddle., MD;  Location: [facility] ENDOSCOPY;  Service: Gastroenterology;;   REMOVAL OF STONES  08/10/2018   Procedure: REMOVAL OF STONES;  Surgeon: Wilhelmenia Aloha Raddle., MD;  Location: [facility] ENDOSCOPY;  Service: Gastroenterology;;   REMOVAL OF STONES  08/27/2018   Procedure: REMOVAL OF STONES;  Surgeon: Wilhelmenia Aloha Raddle., MD;  Location: [facility] ENDOSCOPY;  Service: Gastroenterology;;   REMOVAL OF STONES  01/24/2020   Procedure: REMOVAL OF STONES;  Surgeon: Wilhelmenia Aloha Raddle., MD;  Location: [facility] ENDOSCOPY;  Service: Gastroenterology;;   ANNETT  07/23/2018   Procedure: ANNETT;  Surgeon: Wilhelmenia Aloha Raddle., MD;  Location: [facility] ENDOSCOPY;  Service: Gastroenterology;;   CLEDA REMOVAL  08/27/2018   Procedure: STENT REMOVAL;  Surgeon: Wilhelmenia Aloha Raddle., MD;  Location: [facility] ENDOSCOPY;  Service: Gastroenterology;;   CLEDA REMOVAL  11/30/2018   Procedure: STENT REMOVAL;  Surgeon: Wilhelmenia Aloha Raddle., MD;  Location: [facility] ENDOSCOPY;  Service: Gastroenterology;;   STENT REMOVAL  01/24/2020   Procedure: STENT REMOVAL;  Surgeon: Wilhelmenia Aloha Raddle., MD;  Location: [facility] ENDOSCOPY;  Service: Gastroenterology;;   THIGH FASCIOTOMY Right 04/03/2024   Procedure: FASCIOTOMY, 4 compartment right lower leg;  Surgeon: Serene Gaile ORN, MD;  Location: [facility] OR;  Service: Vascular;  Laterality: Right;   UPPER GI ENDOSCOPY     x 1   VAGINAL PROLAPSE REPAIR  11/18/2019   [facility]   Social History:  reports that she quit smoking about 59  years ago. Her smoking use included cigarettes. She started smoking about 67 years ago. She has a 8 pack-year smoking history. She has never used smokeless tobacco. She reports that she does not drink alcohol  and does not use drugs.  Family / Support Systems Marital Status: Widow/Widower How Long?: Late husband passed last year Patient Roles: Parent, Other (Comment), Volunteer Children: Curtistine and Bruno Other Supports: Jan Anticipated Caregiver: Curtistine and Jan Ability/Limitations of Caregiver: PRN support from Curtistine and Jan Caregiver Availability: Other (Comment) (PRN) Family Dynamics: Good family support  Social History Preferred language: English Religion: Baptist Education: Some college Primary School Teacher - How often do you need to have someone help you when you read instructions, pamphlets, or other written material from your doctor or pharmacy?: Never Writes: Yes Employment Status: Retired   Abuse/Neglect Abuse/Neglect Assessment Can Be Completed: Yes Physical Abuse: Denies Verbal Abuse: Denies Sexual Abuse: Denies Exploitation of patient/patient's resources: Denies Self-Neglect: Denies  Patient response to: Social Isolation - How often do you feel lonely or isolated from those around you?: Never  Emotional Status Pt's affect, behavior and adjustment status: Adjusting to therapy Recent Psychosocial Issues: None Psychiatric History: None Substance Abuse History: Former smoker  Patient / Family Perceptions, Expectations & Goals Pt/Family understanding of illness & functional limitations: Patient/family understanding of illness & functional limitations Premorbid pt/family roles/activities: Very active in the community  with church, parent, grandparenting Anticipated changes in roles/activities/participation: Will slow down community activity until healed Pt/family expectations/goals: Has realistic expectations/goals  [employer]:  None Premorbid Home Care/DME Agencies: None Transportation available at discharge: Curtistine or Jan Is the patient able to respond to transportation needs?: Yes In the past 12 months, has lack of transportation kept you from medical appointments or from getting medications?: No In the past 12 months, has lack of transportation kept you from meetings, work, or from getting things needed for daily living?: No Resource referrals recommended: Neuropsychology (12/23)  Discharge Planning Living Arrangements: Alone Support Systems: Children, Friends/neighbors Type of Residence: Private residence Insurance Resources: [employer] (specify) (Cign[employer] Medicare Supplement) Financial Resources: Social Security Financial Screen Referred: No Living Expenses: Own Money Management: Patient Does the patient have any problems obtaining your medications?: No Home Management: Patient manages own home Patient/Family Preliminary Plans: Plans to return home alone with family support Care Coordinator Barriers to Discharge: Decreased caregiver support, Lack of/limited family support Care Coordinator Barriers to Discharge Comments: Lives alone with PRN family support Care Coordinator Anticipated Follow Up Needs: HH/OP Expected length of stay: 10-14 days  Clinical Impression CSW met with patient/family to introduce herself and complete initial assessment. Patient presented to [facility] following debility. Patient is able to make needs known. Symphony was very independent prior to admission as se was driving, participating in church activities and doing things for herself. She reports feeling limited due to a cyst on the back of her leg that Brandi her NP will address. Sheryn does live alone but reports that upon her discharge, her daughter, Madison will stay with her for a week to ensure safety. She would like to continue [facility] with either [facility] or [facility] in [place].   There were no further needs or concerns at present.  CSW will follow up with family and continue to follow. Will provide patient/family with an update as soon as one becomes available.    Di'Asia  Loreli 04/08/2024, 3:54 PM

## 2024-04-08 NOTE — Progress Notes (Signed)
 Physical Therapy Session Note  Patient Details  Name: Audrey [name] MRN: [id number] Date of Birth: [birth year]  Today's Date: 04/08/2024 PT Individual Time: 0800-0827 PT Individual Time Calculation (min): 27 min   Short Term Goals: Week 1:  PT Short Term Goal 1 (Week 1): pt will perform bed<>chair transfers with supervision PT Short Term Goal 2 (Week 1): pt will ambulate 19ft with LRAD and min A PT Short Term Goal 3 (Week 1): pt will transfer sit<>stand with LRAD and CGA  Skilled Therapeutic Interventions/Progress Updates:      Pt sitting in w/c to start - agreeable to PT tx.  Pt reports 6/10 B feet pain - RN is aware who reports she received pain Rx ~3 hours prior. Mobility and distraction provided for pain management.   Pt propelled herself at supervision level in her w/c (using BUE to propel) from her room to the day room gym, ~150' - mild difficulty with keeping straight path, likely due to unfamiliarity of task.   Sit<>stand to RW with CGA, noted limited weight bearing on her RLE due to discomfort. Able to stand with RW support with supervision without LOB, mainly standing on LLE only. Pt able to tolerate standing for ~2-3 minutes before fatigued and needing to sit. Lowered RW to lowest height but she may need a pediatric sized RW.   Pt ambulated 25' with RW and CGA. Step to gait pattern, antalgic gait pattern, decreased stance time on R and mostly heel weight bearing on RLE. Gait distance limited by fatigue. Cues during gait for increasing stride length, keeping body within RW frame, and general safety.   Pt returned to her room at w/c level for time management and concluded session seated in w/c. NT in room changing bed linen, direct handoff of care.      Therapy Documentation Precautions:  Precautions Precautions: Fall Recall of Precautions/Restrictions: Intact Restrictions Weight Bearing Restrictions Per Provider Order: No RLE Weight Bearing Per Provider Order: Weight  bearing as tolerated General:     Therapy/Group: Individual Therapy  Audrey [name] 04/08/2024, 7:44 AM

## 2024-04-08 NOTE — Progress Notes (Signed)
 Physical Therapy Session Note  Patient Details  Name: Audrey [name] MRN: [id number] Date of Birth: 16-Jun-1940  Today's Date: 04/08/2024 PT Individual Time: 1103-1201 PT Individual Time Calculation (min): 58 min   Short Term Goals: Week 1:  PT Short Term Goal 1 (Week 1): pt will perform bed<>chair transfers with supervision PT Short Term Goal 2 (Week 1): pt will ambulate 78ft with LRAD and min A PT Short Term Goal 3 (Week 1): pt will transfer sit<>stand with LRAD and CGA  Skilled Therapeutic Interventions/Progress Updates:     Pt seated in WC upon arrival. Pt reports 7/10 pain in B LE. Agreeable to therapy. Dressings on R LE draining, notified nursing who came and changed dressings. Nursing also administered meds during session. Pt expressed concern regarding not being able to elevate B LE in [facility] and states she would rather be in her WC than the recliner. Pt trialed elevating leg rests, however pt reported increased pain in back of R calf with elevating leg rest. PT placed a piece of co-band around R toe strap as the velcro on her shoe broke. Session emphasized functional strengthening, endurance, standing and walking tolerance, balance, and transfers. Pt transported dependent in [facility] to ortho gym for time/energy management. Pt practiced stand pivot transfers using RW WC <> mat with CGA and VC for sequencing. 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 Pt amb ~20 ft x2 using RW with CGA and VC for upright posture. Pt only able to tolerate bearing weight through R heel with amb - unable to transition to toes. Once back in room pt requested to remain in [facility] - PT attempted elevating leg rest on R LE again with pillow for improved comfort which pt stated was helpful. Pt remained seated in WC in care of NT at end of session.  Therapy  Documentation Precautions:  Precautions Precautions: Fall Recall of Precautions/Restrictions: Intact Restrictions Weight Bearing Restrictions Per Provider Order: No RLE Weight Bearing Per Provider Order: Weight bearing as tolerated  Therapy/Group: Individual Therapy  Comer CHRISTELLA Levora Comer Levora, PT, DPT 04/08/2024, 7:52 AM

## 2024-04-08 NOTE — Plan of Care (Signed)
°  Problem: Consults Goal: RH GENERAL PATIENT EDUCATION Description: See Patient Education module for education specifics. Outcome: Progressing Goal: Skin Care Protocol Initiated - if Braden Score 18 or less Description: If consults are not indicated, leave blank or document N/A Outcome: Progressing Goal: Nutrition Consult-if indicated Outcome: Progressing Goal: Diabetes Guidelines if Diabetic/Glucose > 140 Description: If diabetic or lab glucose is > 140 mg/dl - Initiate Diabetes/Hyperglycemia Guidelines & Document Interventions  Outcome: Progressing   Problem: RH BOWEL ELIMINATION Goal: RH STG MANAGE BOWEL WITH ASSISTANCE Description: STG Manage Bowel with mod I Assistance. Outcome: Progressing Goal: RH STG MANAGE BOWEL W/MEDICATION W/ASSISTANCE Description: STG Manage Bowel with Medication with mod I Assistance. Outcome: Progressing   Problem: RH SKIN INTEGRITY Goal: RH STG SKIN FREE OF INFECTION/BREAKDOWN Description: Manage skin with min assist Outcome: Progressing Goal: RH STG MAINTAIN SKIN INTEGRITY WITH ASSISTANCE Description: STG Maintain Skin Integrity With min Assistance. Outcome: Progressing Goal: RH STG ABLE TO PERFORM INCISION/WOUND CARE W/ASSISTANCE Description: STG Able To Perform Incision/Wound Care With min Assistance. Outcome: Progressing   Problem: RH SAFETY Goal: RH STG ADHERE TO SAFETY PRECAUTIONS W/ASSISTANCE/DEVICE Description: STG Adhere to Safety Precautions With cues Assistance/Device. Outcome: Progressing   Problem: RH PAIN MANAGEMENT Goal: RH STG PAIN MANAGED AT OR BELOW PT'S PAIN GOAL Description: Pain < 4 with prns Outcome: Progressing   Problem: RH KNOWLEDGE DEFICIT GENERAL Goal: RH STG INCREASE KNOWLEDGE OF SELF CARE AFTER HOSPITALIZATION Description: Patient will be able to manage care at discharge using educational resources for medications, skin care, dietary modification, independently Outcome: Progressing   "

## 2024-04-08 NOTE — Progress Notes (Signed)
 Progress Note    04/08/2024 8:15 AM * No surgery found *  Subjective: No complaints    Vitals:   04/07/24 1500 04/08/24 0322  BP: (!) 148/82 (!) 136/54  Pulse: 72 (!) 55  Resp: 18 18  Temp: 98 F (36.7 C) 98 F (36.7 C)  SpO2: 100% 100%    Physical Exam: General: Sitting in wheelchair Lungs: Nonlabored Incisions: Left groin incision dry and intact with staples.  RLE fasciotomy incisions dressed and dry Extremities: Bilateral feet warm and well-perfused with brisk cap refill   CBC    Component Value Date/Time   WBC 7.7 04/07/2024 0142   RBC 2.73 (L) 04/07/2024 0142   HGB 8.3 (L) 04/07/2024 0142   HGB 12.6 12/03/2023 1359   HGB 11.2 (L) 04/04/2017 0830   HCT 25.3 (L) 04/07/2024 0142   HCT 35.2 04/04/2017 0830   PLT 176 04/07/2024 0142   PLT 233 12/03/2023 1359   PLT 250 04/04/2017 0830   MCV 92.7 04/07/2024 0142   MCV 107.6 (H) 04/04/2017 0830   MCH 30.4 04/07/2024 0142   MCHC 32.8 04/07/2024 0142   RDW 15.6 (H) 04/07/2024 0142   RDW 14.0 04/04/2017 0830   LYMPHSABS 1.1 04/07/2024 0142   LYMPHSABS 1.6 04/04/2017 0830   MONOABS 1.1 (H) 04/07/2024 0142   MONOABS 0.4 04/04/2017 0830   EOSABS 0.1 04/07/2024 0142   EOSABS 0.1 04/04/2017 0830   BASOSABS 0.0 04/07/2024 0142   BASOSABS 0.0 04/04/2017 0830    BMET    Component Value Date/Time   NA 139 04/07/2024 0142   NA 140 04/04/2017 0830   K 3.9 04/07/2024 0142   K 4.1 04/04/2017 0830   CL 103 04/07/2024 0142   CO2 29 04/07/2024 0142   CO2 21 (L) 04/04/2017 0830   GLUCOSE 166 (H) 04/07/2024 0142   GLUCOSE 107 04/04/2017 0830   BUN 25 (H) 04/07/2024 0142   BUN 37.0 (H) 04/04/2017 0830   CREATININE 1.44 (H) 04/07/2024 0142   CREATININE 1.67 (H) 12/03/2023 1359   CREATININE 2.2 (H) 04/04/2017 0830   CALCIUM  8.1 (L) 04/07/2024 0142   CALCIUM  10.1 04/04/2017 0830   GFRNONAA 36 (L) 04/07/2024 0142   GFRNONAA 31 (L) 12/03/2023 1359   GFRAA 30 (L) 01/19/2020 0835    INR    Component Value  Date/Time   INR 1.1 09/01/2018 1850     Intake/Output Summary (Last 24 hours) at 04/08/2024 0815 Last data filed at 04/08/2024 0758 Gross per 24 hour  Intake 356 ml  Output --  Net 356 ml      Assessment/Plan:  83 y.o. female is s/p: follow-up lysis study with right peroneal artery angioplasty and right SFA stent. She required a return trip to the operating room for right leg compartment syndrome and loss of signals in the left leg. She underwent 4 compartment fasciotomy on the right and left iliofemoral endarterectomy with patch angioplast     -She says she is doing much better and denies any recurrent severe pain in her feet.  She does have chronic nerve pain in her feet -Left groin incision is intact and dry without hematoma.  The patient was placing baby powder in her groin directly on the incision.  I have told her not to do that and to keep her incision clean and dry.  She should also keep a dry bandage or washcloth in her groin fold to wick away moisture -Right lower extremity fasciotomy incisions dressed and dry -Bilateral lower extremities warm and  well-perfused -Vascular will continue to follow intermittently   Ahmed Holster, PA-C [facility] [phone] 04/08/2024 8:15 AM

## 2024-04-08 NOTE — Progress Notes (Addendum)
 Patient ID: Audrey [name], female   DOB: [birth year], 83 y.o.   MRN: [id number]  Statement of service delivered.

## 2024-04-08 NOTE — Progress Notes (Signed)
 PROGRESS NOTE   Subjective/Complaints: No new complaints this morning Hgb is trending downward, stool occult ordered, repeat Hgb ordered, magnesium  level ordered  ROS: +fungal infection of toes, +diabetic peripheral neuropathy   Objective:   No results found. Recent Labs    04/06/24 0058 04/07/24 0142  WBC 9.0 7.7  HGB 8.7* 8.3*  HCT 25.8* 25.3*  PLT 165 176   Recent Labs    04/06/24 0058 04/07/24 0142  NA 139 139  K 3.8 3.9  CL 104 103  CO2 26 29  GLUCOSE 159* 166*  BUN 24* 25*  CREATININE 1.50* 1.44*  CALCIUM  7.8* 8.1*    Intake/Output Summary (Last 24 hours) at 04/08/2024 0933 Last data filed at 04/08/2024 0758 Gross per 24 hour  Intake 356 ml  Output --  Net 356 ml     Wound 04/06/24 1500 Pressure Injury Heel Right Deep Tissue Pressure Injury - Purple or maroon localized area of discolored intact skin or blood-filled blister due to damage of underlying soft tissue from pressure and/or shear. (Active)     Wound 04/06/24 1500 Pressure Injury Heel Left;Lateral Deep Tissue Pressure Injury - Purple or maroon localized area of discolored intact skin or blood-filled blister due to damage of underlying soft tissue from pressure and/or shear. (Active)     Wound 04/06/24 1500 Pressure Injury Coccyx Deep Tissue Pressure Injury - Purple or maroon localized area of discolored intact skin or blood-filled blister due to damage of underlying soft tissue from pressure and/or shear. (Active)    Physical Exam: Vital Signs Blood pressure (!) 136/54, pulse (!) 55, temperature 98 F (36.7 C), temperature source Oral, resp. rate 18, height 4' 10 (1.473 m), weight 56.5 kg, SpO2 100%.  Gen: no distress, normal appearing HEENT: oral mucosa pink and moist, NCAT Cardio: Bradycardia Chest: normal effort, normal rate of breathing Abd: soft, non-distended Ext: no edema Psych: pleasant, normal affect Skin: fungal infection  of toenails, right calf incision is healing well, has associated edema Neuro: Alert and oriented x3, 4/5 strength in RLE proximally and 3/5 strength distally, strength is otherwise 5/5 with the exception of 0/5 big toe extension on left foot, decreased sensation in bilateral feet   Assessment/Plan: 1. Functional deficits which require 3+ hours per day of interdisciplinary therapy in a comprehensive inpatient rehab setting. Physiatrist is providing close team supervision and 24 hour management of active medical problems listed below. Physiatrist and rehab team continue to assess barriers to discharge/monitor patient progress toward functional and medical goals  Care Tool:  Bathing              Bathing assist Assist Level: Minimal Assistance - Patient > 75%     Upper Body Dressing/Undressing Upper body dressing        Upper body assist Assist Level: Set up assist    Lower Body Dressing/Undressing Lower body dressing            Lower body assist Assist for lower body dressing: Minimal Assistance - Patient > 75%     Toileting Toileting    Toileting assist Assist for toileting: Contact Guard/Touching assist     Transfers Chair/bed transfer  Transfers assist  Chair/bed transfer assist level: Contact Guard/Touching assist     Locomotion Ambulation   Ambulation assist   Ambulation activity did not occur: Safety/medical concerns (pain, weakness, decreased balance)          Walk 10 feet activity   Assist  Walk 10 feet activity did not occur: Safety/medical concerns (pain, weakness, decreased balance)        Walk 50 feet activity   Assist Walk 50 feet with 2 turns activity did not occur: Safety/medical concerns (pain, weakness, decreased balance)         Walk 150 feet activity   Assist Walk 150 feet activity did not occur: Safety/medical concerns (pain, weakness, decreased balance)         Walk 10 feet on uneven surface   activity   Assist Walk 10 feet on uneven surfaces activity did not occur: Safety/medical concerns (pain, weakness, decreased balance)         Wheelchair     Assist Is the patient using a wheelchair?: Yes Type of Wheelchair: Manual Wheelchair activity did not occur: Safety/medical concerns (pain, weakness)         Wheelchair 50 feet with 2 turns activity    Assist    Wheelchair 50 feet with 2 turns activity did not occur: Safety/medical concerns (pain, weakness)       Wheelchair 150 feet activity     Assist  Wheelchair 150 feet activity did not occur: Safety/medical concerns (pain, weakness)       Blood pressure (!) 136/54, pulse (!) 55, temperature 98 F (36.7 C), temperature source Oral, resp. rate 18, height 4' 10 (1.473 m), weight 56.5 kg, SpO2 100%.   Medical Problem List and Plan: 1. Functional deficits secondary to Debility due to RLE ischemia s/p fasciotomy x4 with wounds on RLE             -patient may  shower if covers incisions             -ELOS/Goals: ~ 10-14 days- supervision to intermittent mod I             Chart and therapy notes reviewed, continue CIR, discussed patient's progress with therapy Grounds pass ordered Vitamin D3/Metanx/Vitamin B/C complex ordered F/u with me or Fidela in clinic within 1 month Would benefit from home health aide upon discharge   -continue Heparin , Hgb ordered given worsening anemia, stool occult ordered, decrease Heparin  to q12H             -antiplatelet therapy: Aspirin  and Plavix    This patient is capable of making decisions on her own behalf.   2. Pain Management: As needed Tylenol , Voltaren , and oxycodone . Lidocaine  patch added for feet, scheduled for outpatient [facility]   3. Fungal infection of toes: antifungal cream added   5. Neuropsych/cognition:    6. RLE fasciotomy:  -Daily dressing changes to fasciotomies and L groin. Staples present on admission. Asked PA to please consult vascular regarding  when staples can be removed and whether our staff can remove them  7. Fluids/Electrolytes/Nutrition: Monitor I&O and weight. Follow up labs CBC/CMP     8. Critical limb ischemia: Continue DAPT indefinitely.  Follow-up with [facility] outpatient   9.  PAD: Continue aspirin . Statin being held d/t rhabdomyolysis.    10.  Hypertension: Continue Norvasc  5 mg daily, Coreg  6.25 mg twice daily. Monitor BP per protocol.   11.   Chronic hx of Adrenalectomy 2018: On home Cortef  10 mg daily and 5 mg nightly.  12.  Hypothyroidism: Conitnue Synthroid  75 mcg.    13.  UTI: UA positive on 12/12, no growth on cultures.  Completed IV rocephin    10.  T2DM: A1c 10.3-monitor CBG AC/HS with SSI NovoLog , increase lantus  to 7U BID   11. Constipation:              -Decrease miralax  to daily, continue colace daily. Simethicone prn. LBM 12/17, d/c prn milk of mag  LOS: 2 days A FACE TO FACE EVALUATION WAS PERFORMED  Sven SQUIBB Coben Godshall 04/08/2024, 9:33 AM

## 2024-04-08 NOTE — Progress Notes (Signed)
 Patient ID: Audrey [name], female   DOB: [birth year], 83 y.o.   MRN: [id number]  DNR signed and placed in shadow chart

## 2024-04-08 NOTE — Progress Notes (Signed)
 Physical Therapy Session Note  Patient Details  Name: Audrey [name] MRN: [id number] Date of Birth: 1940/08/13  Today's Date: 04/08/2024 PT Individual Time: 0930-1041 PT Individual Time Calculation (min): 71 min   Short Term Goals: Week 1:  PT Short Term Goal 1 (Week 1): pt will perform bed<>chair transfers with supervision PT Short Term Goal 2 (Week 1): pt will ambulate 46ft with LRAD and min A PT Short Term Goal 3 (Week 1): pt will transfer sit<>stand with LRAD and CGA  Skilled Therapeutic Interventions/Progress Updates:    Pt presents in w/c, stating her toes are really bothering her today. Adjusted bandages and applied ointment. Removed buddy tape per pt preference. Trial of L shoe (hers is velcro attachment), but unable to fit into R due edema. Issued youth RW for improved fit and alignment. Functional gait training with youth RW x 30' with focus on upright posture, pt WB through L heel only due to pain, and shuffled steps. Attempted to modify her shoes with velcro to get R shoe on - it sort of stayed but the velcro wasn't staying stuck to the fabric - performed another 46' of gait with overall min A same gait pattern - maintains R foot far in front. Seated core strengthening exercises for carryover to balance and mobility with 1kg weighted medicine ball including trunk rotation x 15 reps each - limited due to pain/discomfort. Transitioned to pelvic dissociation and TA activation and scapular retraction seated x 15 reps with cues for technique. Returned to room and set up in w/c. Recommended recliner after next therapy session (in 15 min) to be able to elevated RLE for edema management.   Therapy Documentation Precautions:  Precautions Precautions: Fall Recall of Precautions/Restrictions: Intact Restrictions Weight Bearing Restrictions Per Provider Order: No RLE Weight Bearing Per Provider Order: Weight bearing as tolerated    Pain:  7/10 pain in toes and RLE - adjusted bandages on  toes.    Therapy/Group: Individual Therapy  [name] [name] Elnor, PT, DPT, CBIS  04/08/2024, 10:49 AM

## 2024-04-08 NOTE — Progress Notes (Signed)
 [facility]patient [facility] Individual Statement of Services  Patient Name:  Audrey [name]  Date:  04/08/2024  Welcome to the Inpatient Rehabilitation [facility].  Our goal is to provide you with an individualized program based on your diagnosis and situation, designed to meet your specific needs.  With this comprehensive rehabilitation program, you will be expected to participate in at least 3 hours of rehabilitation therapies Monday-Friday, with modified therapy programming on the weekends.  Your rehabilitation program will include the following services:  Physical Therapy (PT), Occupational Therapy (OT), Speech Therapy (ST), 24 hour per day rehabilitation nursing, Therapeutic Recreaction (TR), Neuropsychology, Care Coordinator, Rehabilitation Medicine, Nutrition Services, and Pharmacy Services  Weekly team conferences will be held on Wednesday to discuss your progress.  Your Inpatient Rehabilitation Care Coordinator will talk with you frequently to get your input and to update you on team discussions.  Team conferences with you and your family in attendance may also be held.  Expected length of stay: 10-14 days   Overall anticipated outcome: Independent with assistive device   Depending on your progress and recovery, your program may change. Your Inpatient Rehabilitation Care Coordinator will coordinate services and will keep you informed of any changes. Your Inpatient Rehabilitation Care Coordinator's name and contact numbers are listed  below.  The following services may also be recommended but are not provided by the Inpatient Rehabilitation Center:  Driving Evaluations Home Health Rehabiltiation Services Outpatient Rehabilitation Services Vocational Rehabilitation   Arrangements will be made to provide these services after discharge if needed.  Arrangements include referral to agencies that provide these services.  Your insurance has been verified to be:  MEDICARE / MEDICARE PART A AND  B Your primary doctor is:  Irven Ozell DEL, MD   Pertinent information will be shared with your doctor and your insurance company.  Inpatient Rehabilitation Care Coordinator:  Di'Asia Loreli SIERRAS [phone] or ELIGAH BRINKS  Information discussed with and copy given to patient by: Waverly Loreli, 04/08/2024, 11:28 AM

## 2024-04-08 NOTE — Progress Notes (Signed)
 Occupational Therapy Session Note  Patient Details  Name: BREELYNN BANKERT MRN: [id number] Date of Birth: 05-Mar-1941  Today's Date: 04/08/2024 OT Individual Time: 1350-1500 OT Individual Time Calculation (min): 70 min    Short Term Goals: Week 1:  OT Short Term Goal 1 (Week 1): Pt will complete bathing at SBA with AE as necessary OT Short Term Goal 2 (Week 1): Pt will complete tub/shower with LRAD at CGA OT Short Term Goal 3 (Week 1): Pt will complete LB dressing with CGA with AE as necessary  Skilled Therapeutic Interventions/Progress Updates:   Patient received seated in wheelchair visiting with her friends.  Patient agreeable to OT session starting early.  Agreeable to shower.  Patient refused covering incision in groin due to raw skin.  Patient avoided this area with water .  Patient sat to shower with multiple comments relating to rough towels/ washcloths - patient with extremely sensitive skin.  Spoke with nurse after shower for dressing changes.  Nurse addressed heel and hand/arm wounds, and elbow protectors modified per patient's request to avoid sheets rubbing on elbows.  Patient declined putting on pants due to incontinence and raw skin on bottom.   Patient left up in wheelchair at end of session with direct hand off to SW.    Therapy Documentation Precautions:  Precautions Precautions: Fall Recall of Precautions/Restrictions: Intact Restrictions Weight Bearing Restrictions Per Provider Order: No RLE Weight Bearing Per Provider Order: Weight bearing as tolerated  Pain Assessment Pain Scale: 0-10 Pain Score: 7  Pain Location: Foot Pain Intervention(s): Medication (See eMAR)    Therapy/Group: Individual Therapy  Jalisha Enneking M 04/08/2024, 3:08 PM

## 2024-04-09 ENCOUNTER — Inpatient Hospital Stay (HOSPITAL_COMMUNITY)

## 2024-04-09 ENCOUNTER — Other Ambulatory Visit: Payer: Self-pay

## 2024-04-09 DIAGNOSIS — F54 Psychological and behavioral factors associated with disorders or diseases classified elsewhere: Secondary | ICD-10-CM | POA: Diagnosis not present

## 2024-04-09 DIAGNOSIS — R5381 Other malaise: Secondary | ICD-10-CM | POA: Diagnosis not present

## 2024-04-09 LAB — GLUCOSE, CAPILLARY
Glucose-Capillary: 185 mg/dL — ABNORMAL HIGH (ref 70–99)
Glucose-Capillary: 243 mg/dL — ABNORMAL HIGH (ref 70–99)
Glucose-Capillary: 369 mg/dL — ABNORMAL HIGH (ref 70–99)
Glucose-Capillary: 173 mg/dL — ABNORMAL HIGH (ref 70–99)

## 2024-04-09 LAB — CK ISOENZYMES
CK-BB: 0 %
CK-MB: 0 % (ref 0–3)
CK-MM: 100 % (ref 97–100)
Creatine Kinase-Total: 952 U/L — ABNORMAL HIGH (ref 26–161)
Macro Type 1: 0 %
Macro Type 2: 0 %

## 2024-04-09 MED ORDER — AMLODIPINE BESYLATE 2.5 MG PO TABS
2.5000 mg | ORAL_TABLET | Freq: Every day | ORAL | Status: DC
  Administered 2024-04-10: 2.5 mg via ORAL
  Filled 2024-04-09: qty 1

## 2024-04-09 MED ORDER — INSULIN GLARGINE 100 UNIT/ML ~~LOC~~ SOLN
8.0000 [IU] | Freq: Two times a day (BID) | SUBCUTANEOUS | Status: DC
  Administered 2024-04-09 – 2024-04-10 (×3): 8 [IU] via SUBCUTANEOUS
  Filled 2024-04-09 (×4): qty 0.08

## 2024-04-09 NOTE — Progress Notes (Signed)
 Physical Therapy Session Note  Patient Details  Name: Audrey [name] MRN: [id number] Date of Birth: 03/13/1941  Today's Date: 04/09/2024 PT Individual Time: 0731-0756 PT Individual Time Calculation (min): 25 min   Short Term Goals: Week 1:  PT Short Term Goal 1 (Week 1): pt will perform bed<>chair transfers with supervision PT Short Term Goal 2 (Week 1): pt will ambulate 31ft with LRAD and min A PT Short Term Goal 3 (Week 1): pt will transfer sit<>stand with LRAD and CGA  Skilled Therapeutic Interventions/Progress Updates:   Received pt sitting in WC, pt agreeable to PT treatment, and reported pain 5/10 in RLE and soreness in L foot - RN notified and planning to administer pain medication. Session with emphasis on functional mobility/transfers, generalized strengthening and endurance, and ambulation. Placed RLE on elevating legrest and pillow for edema management and transported to/from room in [facility] dependently for time management purposes. Donned second going and stood with RW and CGA and ambulated 74ft with RW and CGA. Pt ambulates at decreased cadence, with step to gait pattern, and decreased WB through RLE (however was placing more weight on forefoot vs just heel this morning). Pt c/o tightness along incision and pain behind posterior knee. Returned to room and concluded session with pt sitting in [facility] with all needs within reach.   Therapy Documentation Precautions:  Precautions Precautions: Fall Recall of Precautions/Restrictions: Intact Restrictions Weight Bearing Restrictions Per Provider Order: No RLE Weight Bearing Per Provider Order: Weight bearing as tolerated  Therapy/Group: Individual Therapy [name] Zaunegger Therisa Stains PT, DPT 04/09/2024, 6:49 AM

## 2024-04-09 NOTE — Plan of Care (Signed)
" °  Problem: Consults Goal: RH GENERAL PATIENT EDUCATION Description: See Patient Education module for education specifics. Outcome: Progressing Goal: Skin Care Protocol Initiated - if Braden Score 18 or less Description: If consults are not indicated, leave blank or document N/A Outcome: Progressing Goal: Nutrition Consult-if indicated Outcome: Progressing Goal: Diabetes Guidelines if Diabetic/Glucose > 140 Description: If diabetic or lab glucose is > 140 mg/dl - Initiate Diabetes/Hyperglycemia Guidelines & Document Interventions  Outcome: Progressing   Problem: RH BOWEL ELIMINATION Goal: RH STG MANAGE BOWEL WITH ASSISTANCE Description: STG Manage Bowel with mod I Assistance. Outcome: Progressing Goal: RH STG MANAGE BOWEL W/MEDICATION W/ASSISTANCE Description: STG Manage Bowel with Medication with mod I Assistance. Outcome: Progressing   Problem: RH SKIN INTEGRITY Goal: RH STG SKIN FREE OF INFECTION/BREAKDOWN Description: Manage skin with min assist Outcome: Progressing Goal: RH STG MAINTAIN SKIN INTEGRITY WITH ASSISTANCE Description: STG Maintain Skin Integrity With min Assistance. Outcome: Progressing Goal: RH STG ABLE TO PERFORM INCISION/WOUND CARE W/ASSISTANCE Description: STG Able To Perform Incision/Wound Care With min Assistance. Outcome: Progressing   Problem: RH SAFETY Goal: RH STG ADHERE TO SAFETY PRECAUTIONS W/ASSISTANCE/DEVICE Description: STG Adhere to Safety Precautions With cues Assistance/Device. Outcome: Progressing   Problem: RH PAIN MANAGEMENT Goal: RH STG PAIN MANAGED AT OR BELOW PT'S PAIN GOAL Description: Pain < 4 with prns Outcome: Progressing   Problem: RH KNOWLEDGE DEFICIT GENERAL Goal: RH STG INCREASE KNOWLEDGE OF SELF CARE AFTER HOSPITALIZATION Description: Patient will be able to manage care at discharge using educational resources for medications, skin care, dietary modification, independently Outcome: Progressing   "

## 2024-04-09 NOTE — Progress Notes (Signed)
 Unable to see patient today as she is working with neuropsych during morning rounds. Asked PA to please check in on her today and ask if she is ok with ordering an MRI to evaluate her Baker's cyst. If present, ortho has recommended consultation of IR for aspiration.  Patient's chart reviewed- No issues reported overnight BP soft- amlodipine  decreased to 2.5mg  CBGs >200, insulin  increased to 8U BID

## 2024-04-09 NOTE — Progress Notes (Signed)
 Occupational Therapy Session Note  Patient Details  Name: Audrey [name] MRN: [id number] Date of Birth: Sep 03, 1940  Today's Date: 04/09/2024 OT Individual Time: 9090-9065 & 1420-1530 OT Individual Time Calculation (min): 25 min & 70 min   Short Term Goals: Week 1:  OT Short Term Goal 1 (Week 1): Pt will complete bathing at SBA with AE as necessary OT Short Term Goal 2 (Week 1): Pt will complete tub/shower with LRAD at CGA OT Short Term Goal 3 (Week 1): Pt will complete LB dressing with CGA with AE as necessary  Skilled Therapeutic Interventions/Progress Updates:  Session 1 Skilled OT intervention completed with focus on ADL retraining and DC planning. Pt received seated in w/c, agreeable to session. Intermittent unrated pain reported in groin and RLE; pre-medicated. OT offered rest breaks, repositioning for pain reduction.  Pt seemingly particular with sensitive skin and multiple wounds. C/o feeling hot, but sore skin and wetness from not wearing LB clothing. Discussed ways she can wear loose clothing such as dress to promote community outings and engagement. Pt eager to try mesh underwear with pad, and loose personal pants this morning. Education provided on reacher use for LB dressing. Pt able to utilize to donn with min A for RLE only. CGA sit > stand and for standing balance with RW. Applied interdry layer to skin fold on medial abdomen, as pt reports it helps with the irritation but usually falls during mobility, but agreeable to trial now with underwear to hold it in place. Pt ambulated with CGA about 10 ft in room with RW to stretch RLE.  Pt remained seated in w/c with BLE elevated, fan provided for hot spell, with chair alarm on/activated, and with all needs in reach at end of session.  Session 2 Skilled OT intervention completed with focus on functional community mobility and toileting. Pt received seated in w/c with nursing present assisting with wound care, agreeable to  session. RLE pain reported; pre-medicated. OT offered rest breaks, repositioning throughout for pain reduction.  Assisted pt per request with covering chest port with glove due to reported sensitive skin from end of port. Nurse aware. Pt with report of lack of comfortable clothing to wear with her. Agreeable to go to gift shop to browse items. Transported dependently for time, then pt self-propelled in w/c around gift shop with supervision/cues needed for navigating narrow paths due to leg rests being on. Discussed how being at w/c level impacts reaching for items, and navigating especially if not w/c accessible. Pt selected several items independently to purchase with discussion on appropriate items for ease of access to chest port and IV sites. Pt was able to identify items she could eat in the snack department following her carb diet. Required min A to manage card at check out.   Back in room, pt required mod A to doff current shirt, mod A to donn new button down. Completed CGA sit > stand and min A ambulatory transfer with RW > toilet with cues needed for correcting lateral LOB when going over threshold. CGA for toileting steps including pad removal and application of butt cream per request. Ambulated CGA > w/c. Seated hand hygiene without set up A. Pt remained seated in w/c with BLE elevated, with chair alarm on/activated, and with all needs in reach at end of session.   Therapy Documentation Precautions:  Precautions Precautions: Fall Recall of Precautions/Restrictions: Intact Restrictions Weight Bearing Restrictions Per Provider Order: No RLE Weight Bearing Per Provider Order: Weight bearing as tolerated  Therapy/Group: Individual Therapy  [name], MS, OTR/L  04/09/2024, 3:42 PM

## 2024-04-09 NOTE — Progress Notes (Signed)
 Physical Therapy Session Note  Patient Details  Name: Audrey [name] MRN: [id number] Date of Birth: [birth year]  Today's Date: 04/09/2024 PT Individual Time: 1008-1101 PT Individual Time Calculation (min): 53 min   Short Term Goals: Week 1:  PT Short Term Goal 1 (Week 1): pt will perform bed<>chair transfers with supervision PT Short Term Goal 2 (Week 1): pt will ambulate 35ft with LRAD and min A PT Short Term Goal 3 (Week 1): pt will transfer sit<>stand with LRAD and CGA  Skilled Therapeutic Interventions/Progress Updates: Patient following nsg care on entrance to room. Patient alert and agreeable to PT session.   Patient reported unrated pain on R LE (rest breaks provided prn)  Therapeutic Activity: Transfers: Pt performed sit<>stand transfers throughout session with supervision and RW. Provided VC for hand placement to control descent.  Pt ambulated 5' with close supervision and WC follow with pt demonstrating decreased step length on L LE. Pt unable to step to or through due to increased pain with dorsiflexion and knee extension (pt did, however, slightly increase length towards end on L LE).  Therapeutic Exercise: Pt performed the following exercises with therapist providing the described cuing and facilitation for improvement. Pt self propelled WC from room<main gym and required increased time to navigate hallways with decreased speed. Pt use of B UE's and only required supervision - Standing endurance with B UE support 2 min x 1 with pt requiring rest break. 2nd round, pt performed for 6 min 45s and with cue to decrease WB on B UE by only using fingers to assist with balance. - Pt performed step to 6 step with R LE and target cue to avoid touching PTA leg on lateral side to avoid circumducting compensation. Pt only able to perform x 4 in this ROM due to pain increase in R posterior lower leg and required seated rest.   Patient sitting in WC at end of session with brakes locked, and  all needs within reach.      Therapy Documentation Precautions:  Precautions Precautions: Fall Recall of Precautions/Restrictions: Intact Restrictions Weight Bearing Restrictions Per Provider Order: No RLE Weight Bearing Per Provider Order: Weight bearing as tolerated  Therapy/Group: Individual Therapy  Kishan Wachsmuth PTA 04/09/2024, 12:15 PM

## 2024-04-09 NOTE — Consult Note (Signed)
 Neuropsychological Consultation Comprehensive Inpatient Rehab   Patient:   Audrey [name]   DOB:   08-Jan-1941  MR Number:  [id number]  Location:  [facility] [address] CENTER [address] [place] [address] Dept: [phone] Loc: [phone]           Date of Service:   04/09/2024  Start Time:   8 AM End Time:   9 AM  Provider/Observer:  Norleen Asa, Psy.D.       Clinical Neuropsychologist       Billing Code/Service: [phone]  Reason for Service:    Audrey [name] is an 83 year old right-handed female referred for a neuropsychological consultation due to coping and adjustment issues during her current hospitalization. She is currently an inpatient on the rehabilitation unit at [facility] following multiple vascular procedures for critical limb ischemia.  Her past medical history is significant for diabetes mellitus type II, GERD, hypertension, hyperlipidemia, hypothyroidism, coronary artery disease/myocardial infarction, adrenal insufficiency, metastatic renal cell carcinoma (in remission), and peripheral artery disease. She has a history of right SFA stenting in July 2024 and atherectomy and stenting of the right SFA, popliteal, and TP trunk on 12/29/2023. She was admitted on 04/01/2024 for critical limb ischemia and underwent multiple procedures, including angiogram, laser atherectomy, and angioplasty. Her hospital course was complicated by a hematoma, compartment syndrome requiring fasciotomy, and mild rhabdomyolysis. She is now admitted to the rehabilitation unit for a comprehensive program due to decreased functional mobility. Prior to this admission, she was active and independent in the community. She currently requires minimal to moderate assistance with mobility and basic activities of daily living.  Presenting Issues at Review: Admitted for comprehensive rehabilitation following multiple vascular procedures for  critical limb ischemia. Currently requires minimal to moderate assistance with mobility and basic ADLs. Expressed concern about the possibility of leg amputation and the impact on her independence, including driving, shopping, and visiting family. Expressed fear of being placed in a nursing home by her children if she loses a limb.  Session Summary and Clinical Update: This morning I met with the patient to provide psychoeducation regarding her current hospitalization and rehabilitation. The purpose of the inpatient rehabilitation unit was explained, including the benefits of receiving intensive therapies while under close medical supervision to prevent complications and reduce the risk of readmission. The goal of the rehabilitation program is to maximize her functional recovery to allow for a safe discharge home, potentially with family support, and avoid a skilled nursing facility placement. The patient verbalized understanding of the plan. She is actively participating in therapies. She is self-monitoring her blood glucose and is making appropriate dietary choices.  She reports feeling hot. Her temperature is being monitored. She is aware of her diabetes and has been managing it since age 42. She expressed concern with her outpatient endocrinologist, feeling he is too lenient. She was advised to communicate her concerns with him directly. It was explained that her records from this admission can be sent to him. She plans to call his office to ensure he receives the discharge summary.  Mental State Examination: Appearance and Behaviour: Appeared well-groomed. Engaged appropriately in conversation. No signs of agitation or distractibility. Mood and Affect: Reported feeling hot and concerned about her prognosis. Affect was appropriate to the content of the discussion, with a range of emotions from worry to understanding. Thought Content/Process: Thought process was logical and coherent. No evidence of  disorganization, perseveration, or paranoia. Preoccupied with  concerns about her leg and future independence. Speech: Rate and volume were within normal limits. Articulation was clear. Cognition: Appeared oriented to person and place. Denied knowing the name of the unit but understood its function after explanation. Memory for recent events appears intact. No word-finding difficulties noted. Insight and Judgement: Demonstrates good insight into her medical condition and the importance of her rehabilitation. Judgement appears intact, as evidenced by her proactive approach to managing her diet and glucose levels.  Risk Stratification: Suicide/Self-Harm Risk: No current ideation or passive death wish reported. No history of attempts. No impulsivity noted. Protective factors include strong family support and motivation for recovery. Cognitive Risk Factors: No wandering or misplacing items reported. No failure to attend to hygiene. Decision-making appears intact. Systemic Risk: Expressed fear of being placed in a nursing home by her children. Her daughter is planning to stay with her for a week after discharge, which is a significant protective factor. Level of Concern: Moderate. Concerns are related to her medical prognosis, potential for limb loss, and its impact on her independence and living situation. She is actively engaged in her care, which mitigates risk.  Interventions or Recommendations: Psychoeducation was provided on the purpose and benefits of inpatient rehabilitation. The goal of maximizing functional recovery to facilitate a safe discharge home was discussed. Reassurance was provided regarding the plan to avoid a skilled nursing facility if possible. Encouraged to continue with her active participation in therapies and self-monitoring of blood glucose. Advised to communicate her concerns about her outpatient endocrinologist's management style directly with him. Discussed the process for  transferring medical records to her outpatient provider.  Plan and Next Review: Continue with the current rehabilitation program, including physical, occupational, and other therapies as indicated. Continue to monitor blood glucose and manage diet. A discharge summary will be provided upon discharge, and PA/NP will review it with her. Follow-up with her outpatient endocrinologist will be necessary. The goal is for discharge home with family support. Next review as needed.        Electronically Signed   _______________________ Norleen Asa, Psy.D. Clinical Neuropsychologist

## 2024-04-09 NOTE — IPOC Note (Signed)
 Overall Plan of Care [facility]) Patient Details Name: Audrey [name] MRN: [id number] DOB: May 28, 1940  Admitting Diagnosis: Debility  Hospital Problems: Principal Problem:   Debility Active Problems:   H/O fasciotomy   Critical limb ischemia of right lower extremity with ulceration of foot (HCC)   Coping style affecting medical condition     Functional Problem List: Nursing Bowel, Safety, Endurance, Skin Integrity, Medication Management, Nutrition, Pain  PT Balance, Edema, Endurance, Motor, Nutrition, Pain, Sensory, Skin Integrity  OT Balance, Pain, Safety, Sensory, Motor, Skin Integrity, Endurance  SLP    TR         Basic ADLs: OT       Advanced  ADLs: OT Light Housekeeping     Transfers: PT Bed Mobility, Bed to Chair, Car, Occupational Psychologist, Research Scientist (life Sciences): PT Ambulation, Psychologist, [employer], Stairs     Additional Impairments: OT None  SLP        TR      Anticipated Outcomes Item Anticipated Outcome  Self Feeding mod I  Swallowing      Basic self-care  mod I  Toileting  mod I   Bathroom Transfers mod I  Bowel/Bladder  manage bowel w mod I assist  Transfers  Mod I with LRAD  Locomotion  Mod I with LRAD  Communication     Cognition     Pain  Pain < 4 with prns  Safety/Judgment  manage safety w cues   Therapy Plan: PT Intensity: Minimum of 1-2 x/day ,45 to 90 minutes PT Frequency: 5 out of 7 days PT Duration Estimated Length of Stay: 2 weeks OT Intensity: Minimum of 1-2 x/day, 45 to 90 minutes OT Frequency: 5 out of 7 days OT Duration/Estimated Length of Stay: 10-14 days     Team Interventions: Nursing Interventions Patient/Family Education, Pain Management, Medication Management, Discharge Planning, Bowel Management, Skin Care/Wound Management, Disease Management/Prevention  PT interventions Ambulation/gait training, Discharge planning, Functional mobility training, Psychosocial support, Therapeutic Activities,  Visual/perceptual remediation/compensation, Balance/vestibular training, Disease management/prevention, Neuromuscular re-education, Skin care/wound management, Therapeutic Exercise, Wheelchair propulsion/positioning, DME/adaptive equipment instruction, Pain management, UE/LE Strength taining/ROM, Splinting/orthotics, Community reintegration, Equities Trader education, Museum/gallery curator, UE/LE Coordination activities  OT Interventions Warden/ranger, Discharge planning, Pain management, Self Care/advanced ADL retraining, Therapeutic Activities, UE/LE Coordination activities, Disease mangement/prevention, Functional mobility training, Patient/family education, Skin care/wound managment, Therapeutic Exercise, Community reintegration, Fish Farm Manager, Neuromuscular re-education, Psychosocial support, Splinting/orthotics, UE/LE Strength taining/ROM, Wheelchair propulsion/positioning  SLP Interventions    TR Interventions    SW/CM Interventions Discharge Planning, Psychosocial Support, Patient/Family Education, Disease Management/Prevention   Barriers to Discharge MD  Medical stability  Nursing Home environment access/layout, Decreased caregiver support, Wound Care 2 level, main living, sleeps on a day bed, 1 ste solo; family to assist at d/c  PT Home environment access/layout, Decreased caregiver support, Wound Care, Other (comments) pain in RLE, lives alone, 1 STE home, wound care  OT None    SLP      SW Decreased caregiver support, Lack of/limited family support Lives alone with PRN family support   Team Discharge Planning: Destination: PT-Home ,OT- Home , SLP-  Projected Follow-up: PT-Home health PT, OT-  Home health OT, SLP-  Projected Equipment Needs: PT-To be determined, OT- To be determined, SLP-  Equipment Details: PT-has RW, quad cane, and rollator, OT-  Patient/family involved in discharge planning: PT- Patient,  OT-Patient, SLP-   MD ELOS: 10-14  days Medical Rehab Prognosis:  Excellent Assessment: The patient has been admitted  for CIR therapies with the diagnosis of debility 2/2 RLW ischemia s/p fasciotomy x4 with wounds on RLE. The team will be addressing functional mobility, strength, stamina, balance, safety, adaptive techniques and equipment, self-care, bowel and bladder mgt, patient and caregiver education. Goals have been set at S/modi. Anticipated discharge destination is home.        See Team Conference Notes for weekly updates to the plan of care

## 2024-04-10 ENCOUNTER — Inpatient Hospital Stay (HOSPITAL_COMMUNITY)

## 2024-04-10 LAB — GLUCOSE, CAPILLARY
Glucose-Capillary: 159 mg/dL — ABNORMAL HIGH (ref 70–99)
Glucose-Capillary: 201 mg/dL — ABNORMAL HIGH (ref 70–99)
Glucose-Capillary: 195 mg/dL — ABNORMAL HIGH (ref 70–99)
Glucose-Capillary: 151 mg/dL — ABNORMAL HIGH (ref 70–99)

## 2024-04-10 MED ORDER — INSULIN GLARGINE 100 UNIT/ML ~~LOC~~ SOLN
9.0000 [IU] | Freq: Two times a day (BID) | SUBCUTANEOUS | Status: DC
  Administered 2024-04-10 – 2024-04-11 (×2): 9 [IU] via SUBCUTANEOUS
  Filled 2024-04-10 (×3): qty 0.09

## 2024-04-10 MED ORDER — SORBITOL 70 % SOLN
30.0000 mL | Freq: Once | Status: AC
  Administered 2024-04-10: 30 mL via ORAL
  Filled 2024-04-10: qty 30

## 2024-04-10 NOTE — Progress Notes (Signed)
 Physical Therapy Session Note  Patient Details  Name: Audrey [name] MRN: [id number] Date of Birth: 1940/09/27  Today's Date: 04/10/2024 PT Individual Time: 0731-0826 PT Individual Time Calculation (min): 55 min   Short Term Goals: Week 1:  PT Short Term Goal 1 (Week 1): pt will perform bed<>chair transfers with supervision PT Short Term Goal 2 (Week 1): pt will ambulate 110ft with LRAD and min A PT Short Term Goal 3 (Week 1): pt will transfer sit<>stand with LRAD and CGA  Skilled Therapeutic Interventions/Progress Updates:   Received pt semi-reclined in bed reporting urge to void. Pt agreeable to PT treatment and reported pain 6/10 in RLE and R/L toes. Session with emphasis on functional mobility/transfers, toileting, generalized strengthening and endurance, dynamic standing balance/coordination, and ambulation. Of note, pt required ++ time with mobility this morning due to receiving medications, getting ready for the day, and pt particular with her routine. Transferred semi-reclined<>sitting L EOB with HOB elevated and use of bedrails with supervision. Pt performed all transfers with youth RW and CGA/close supervision throughout session. Pt ambulated in/out of bathroom with RW and CGA. Pt able to void and perform hygiene management without assist. Sat in WC at sink and brushed teeth and washed/groomed hair with setup assist. Donned robe with min A and RN present to administer medications.   Removed socks to inspect toes - noted increased redness and edema in RLE. RN present to administer topical medication per pt request. Therapist assisted with supporting R foot while RN applied cream and dressed R foot and skin tear on L hand. Donned velcro shoes and placed RLE on elevating legrest with pillow underneath for edema control. Pt performed WC mobility 120ft using BUE and supervision/mod I with emphasis on UE strength/endurance. Discussed equipment for discharge and recommendation for 16x16 manual WC  with elevating legrests - pt in agreement. Encouraged pt to work on ambulation with next OT due to time restrictions this morning and pt agreed. Returned to room and concluded session with pt sitting in [facility] with all needs within reach.  Therapy Documentation Precautions:  Precautions Precautions: Fall Recall of Precautions/Restrictions: Intact Restrictions Weight Bearing Restrictions Per Provider Order: No RLE Weight Bearing Per Provider Order: Weight bearing as tolerated  Therapy/Group: Individual Therapy Messiah Rovira Zaunegger Therisa Stains PT, DPT 04/10/2024, 6:57 AM

## 2024-04-10 NOTE — Plan of Care (Signed)
 IR was requested for image guided right Baker cyst aspiration.   US  right leg limited obtained and reviewed by Dr. Vanice, there is no Baker's cyst.    Ordering provider notified.   Will delete the IR eval order.  Please call IR for questions and concerns.   Shifa Brisbon H Elsie Baynes PA-C 04/10/2024 10:38 AM

## 2024-04-10 NOTE — Progress Notes (Signed)
 Occupational Therapy Session Note  Patient Details  Name: Audrey [name] MRN: [id number] Date of Birth: Nov 06, 1940  Today's Date: 04/10/2024 OT Individual Time: 0950-1100 OT Individual Time Calculation (min): 70 min    Short Term Goals: Week 1:  OT Short Term Goal 1 (Week 1): Pt will complete bathing at SBA with AE as necessary OT Short Term Goal 2 (Week 1): Pt will complete tub/shower with LRAD at CGA OT Short Term Goal 3 (Week 1): Pt will complete LB dressing with CGA with AE as necessary  Skilled Therapeutic Interventions/Progress Updates:  Skilled OT intervention completed with focus on ADL retraining, adaptation to wound shoes, and functional ambulatory endurance. Pt received seated in w/c, agreeable to session. Generalized pain reported RLE; pre-medicated. OT offered rest breaks and repositioning throughout for pain reduction.  Pt requested to toilet. Completed all sit > stands and ambulatory transfers using RW with CGA throughout session. Ambulated w/c <> toilet. Able to manage all parts for continent urinary void with CGA for balance. Cueing needed to recall reacher for LB dressing. Pt donned mesh undies with makeshift pad as well as pants with min A for difficulty donning over leg wound bandages. Set up A for donning shirt.   Per reported that current wound shoes have poor velcro, which impacts ability to remain snug for ambulating. Transported pt to gym, then doffed shoes and attached strap with velcro attachment for modified strategy to strap shoes. Pt able to adjust straps and donn/doff with cueing only but no physical assist. Pt then ambulated 93 ft with w/c follow for fatigue with momentary breaks for no longer than 5 seconds each time.  Pt remained seated in w/c, with chair alarm on/activated, and with all needs in reach at end of session.   Therapy Documentation Precautions:  Precautions Precautions: Fall Recall of Precautions/Restrictions: Intact Restrictions Weight  Bearing Restrictions Per Provider Order: No RLE Weight Bearing Per Provider Order: Weight bearing as tolerated    Therapy/Group: Individual Therapy  [name], MS, OTR/L  04/10/2024, 12:13 PM

## 2024-04-10 NOTE — Plan of Care (Signed)
" °  Problem: Consults Goal: RH GENERAL PATIENT EDUCATION Description: See Patient Education module for education specifics. Outcome: Progressing Goal: Skin Care Protocol Initiated - if Braden Score 18 or less Description: If consults are not indicated, leave blank or document N/A Outcome: Progressing Goal: Nutrition Consult-if indicated Outcome: Progressing Goal: Diabetes Guidelines if Diabetic/Glucose > 140 Description: If diabetic or lab glucose is > 140 mg/dl - Initiate Diabetes/Hyperglycemia Guidelines & Document Interventions  Outcome: Progressing   Problem: RH BOWEL ELIMINATION Goal: RH STG MANAGE BOWEL WITH ASSISTANCE Description: STG Manage Bowel with mod I Assistance. Outcome: Progressing Goal: RH STG MANAGE BOWEL W/MEDICATION W/ASSISTANCE Description: STG Manage Bowel with Medication with mod I Assistance. Outcome: Progressing   Problem: RH SKIN INTEGRITY Goal: RH STG SKIN FREE OF INFECTION/BREAKDOWN Description: Manage skin with min assist Outcome: Progressing Goal: RH STG MAINTAIN SKIN INTEGRITY WITH ASSISTANCE Description: STG Maintain Skin Integrity With min Assistance. Outcome: Progressing Goal: RH STG ABLE TO PERFORM INCISION/WOUND CARE W/ASSISTANCE Description: STG Able To Perform Incision/Wound Care With min Assistance. Outcome: Progressing   Problem: RH SAFETY Goal: RH STG ADHERE TO SAFETY PRECAUTIONS W/ASSISTANCE/DEVICE Description: STG Adhere to Safety Precautions With cues Assistance/Device. Outcome: Progressing   Problem: RH PAIN MANAGEMENT Goal: RH STG PAIN MANAGED AT OR BELOW PT'S PAIN GOAL Description: Pain < 4 with prns Outcome: Progressing   Problem: RH KNOWLEDGE DEFICIT GENERAL Goal: RH STG INCREASE KNOWLEDGE OF SELF CARE AFTER HOSPITALIZATION Description: Patient will be able to manage care at discharge using educational resources for medications, skin care, dietary modification, independently Outcome: Progressing   "

## 2024-04-10 NOTE — Progress Notes (Addendum)
 "                                                        PROGRESS NOTE   Subjective/Complaints: Discussed with patient that IR found no Baker's cyst on US  examination of her knee- they could not identify source of pain, discussed getting MRI but patient defers  ROS: +fungal infection of toes, +diabetic peripheral neuropathy, +pain posterior to right knee   Objective:   No results found. Recent Labs    04/08/24 1250  WBC 12.4*  HGB 10.3*  HCT 31.5*  PLT 345   No results for input(s): NA, K, CL, CO2, GLUCOSE, BUN, CREATININE, CALCIUM  in the last 72 hours.   Intake/Output Summary (Last 24 hours) at 04/10/2024 1106 Last data filed at 04/10/2024 9377 Gross per 24 hour  Intake 476 ml  Output 100 ml  Net 376 ml     Wound 04/06/24 1500 Pressure Injury Heel Right Deep Tissue Pressure Injury - Purple or maroon localized area of discolored intact skin or blood-filled blister due to damage of underlying soft tissue from pressure and/or shear. (Active)     Wound 04/06/24 1500 Pressure Injury Heel Left;Lateral Deep Tissue Pressure Injury - Purple or maroon localized area of discolored intact skin or blood-filled blister due to damage of underlying soft tissue from pressure and/or shear. (Active)     Wound 04/06/24 1500 Pressure Injury Coccyx Deep Tissue Pressure Injury - Purple or maroon localized area of discolored intact skin or blood-filled blister due to damage of underlying soft tissue from pressure and/or shear. (Active)    Physical Exam: Vital Signs Blood pressure (!) 129/44, pulse 60, temperature 97.7 F (36.5 C), temperature source Oral, resp. rate 17, height 4' 10 (1.473 m), weight 56.5 kg, SpO2 99%.  Gen: no distress, normal appearing HEENT: oral mucosa pink and moist, NCAT Cardio: Bradycardia Chest: normal effort, normal rate of breathing Abd: soft, non-distended Ext: no edema Psych: pleasant, normal affect Skin: fungal infection of toenails, right  calf incision is healing well, has associated edema Neuro: Alert and oriented x3, 4/5 strength in RLE proximally and 3/5 strength distally, strength is otherwise 5/5 with the exception of 0/5 big toe extension on left foot, decreased sensation in bilateral feet, stable 12/20 MSK: TTP posterior to right knee with mild edema   Assessment/Plan: 1. Functional deficits which require 3+ hours per day of interdisciplinary therapy in a comprehensive inpatient rehab setting. Physiatrist is providing close team supervision and 24 hour management of active medical problems listed below. Physiatrist and rehab team continue to assess barriers to discharge/monitor patient progress toward functional and medical goals  Care Tool:  Bathing    Body parts bathed by patient: Right arm, Left arm, Chest, Abdomen, Front perineal area, Buttocks, Right upper leg, Left upper leg, Face     Body parts n/a: Right lower leg, Left lower leg   Bathing assist Assist Level: Contact Guard/Touching assist     Upper Body Dressing/Undressing Upper body dressing   What is the patient wearing?: Hospital gown only    Upper body assist Assist Level: Set up assist    Lower Body Dressing/Undressing Lower body dressing      What is the patient wearing?: Underwear/pull up, Pants     Lower body assist Assist for lower body dressing: Minimal Assistance -  Patient > 75%     Editor, Commissioning assist Assist for toileting: Contact Guard/Touching assist     Transfers Chair/bed transfer  Transfers assist     Chair/bed transfer assist level: Contact Guard/Touching assist     Locomotion Ambulation   Ambulation assist   Ambulation activity did not occur: Safety/medical concerns (pain, weakness, decreased balance)  Assist level: Contact Guard/Touching assist Assistive device: Walker-rolling Max distance: 43ft   Walk 10 feet activity   Assist  Walk 10 feet activity did not occur:  Safety/medical concerns (pain, weakness, decreased balance)  Assist level: Contact Guard/Touching assist Assistive device: Walker-rolling   Walk 50 feet activity   Assist Walk 50 feet with 2 turns activity did not occur: Safety/medical concerns (pain, weakness, decreased balance)         Walk 150 feet activity   Assist Walk 150 feet activity did not occur: Safety/medical concerns (pain, weakness, decreased balance)         Walk 10 feet on uneven surface  activity   Assist Walk 10 feet on uneven surfaces activity did not occur: Safety/medical concerns (pain, weakness, decreased balance)         Wheelchair     Assist Is the patient using a wheelchair?: Yes Type of Wheelchair: Manual Wheelchair activity did not occur: Safety/medical concerns (pain, weakness)  Wheelchair assist level: Supervision/Verbal cueing Max wheelchair distance: 131ft    Wheelchair 50 feet with 2 turns activity    Assist    Wheelchair 50 feet with 2 turns activity did not occur: Safety/medical concerns (pain, weakness)   Assist Level: Supervision/Verbal cueing   Wheelchair 150 feet activity     Assist  Wheelchair 150 feet activity did not occur: Safety/medical concerns (pain, weakness)   Assist Level: Supervision/Verbal cueing   Blood pressure (!) 129/44, pulse 60, temperature 97.7 F (36.5 C), temperature source Oral, resp. rate 17, height 4' 10 (1.473 m), weight 56.5 kg, SpO2 99%.   Medical Problem List and Plan: 1. Functional deficits secondary to Debility due to RLE ischemia s/p fasciotomy x4 with wounds on RLE             -patient may  shower if covers incisions             -ELOS/Goals: ~ 10-14 days- supervision to intermittent mod I             Chart and therapy notes reviewed, continue CIR, discussed patient's progress with therapy Grounds pass ordered Vitamin D3/Metanx/Vitamin B/C complex ordered F/u with me or Fidela in clinic within 1 month Would benefit  from home health aide upon discharge   -Heparin  decreased to q12H 2/2 anemia, Hgb has now improved to 10.3, continue this dose             -antiplatelet therapy: Aspirin  and Plavix    This patient is capable of making decisions on her own behalf.   2. Pain:  A) Diabetic peripheral neuropathy, chronic: As needed Tylenol , Voltaren , and oxycodone . 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 Discussed with patient getting MRI but she defers   3. Fungal infection of toes: antifungal cream added   5. Neuropsych/cognition:    6. RLE fasciotomy:  -Daily dressing changes to fasciotomies and L groin. Staples present on admission. Asked PA to please consult vascular regarding when staples can be removed and whether our staff can remove  them  7. Fluids/Electrolytes/Nutrition: Monitor I&O and weight. Follow up labs CBC/CMP     8. Critical limb ischemia: Continue DAPT indefinitely.  Follow-up with [facility] outpatient   9.  PAD: Continue aspirin . Statin being held d/t rhabdomyolysis.    10.  Hypertension: d/c norvasc  since DBP is in 40s, Coreg  6.25 mg twice daily. Monitor BP per protocol.   11.   Chronic hx of Adrenalectomy 2018: On home Cortef  10 mg daily and 5 mg nightly.    12.  Hypothyroidism: Conitnue Synthroid  75 mcg.    13.  UTI: UA positive on 12/12, no growth on cultures.  Completed IV rocephin    14.  T2DM: A1c 10.3-monitor CBG AC/HS with SSI NovoLog , increase lantus  to 8U BID   15. Constipation:              -Decrease miralax  to daily, continue colace daily. Simethicone  prn. LBM 12/18, d/c prn milk of mag given kidney function, sorbitol  ordered 12/20  LOS: 4 days A FACE TO FACE EVALUATION WAS PERFORMED  Layna Roeper P Aggie Douse 04/10/2024, 11:06 AM     "

## 2024-04-10 NOTE — Progress Notes (Signed)
 Occupational Therapy Session Note  Patient Details  Name: Audrey [name] MRN: [id number] Date of Birth: 1940/08/31  Today's Date: 04/10/2024 OT Individual Time: 1300-1418 OT Individual Time Calculation (min): 78 min    Short Term Goals: Week 1:  OT Short Term Goal 1 (Week 1): Pt will complete bathing at SBA with AE as necessary OT Short Term Goal 2 (Week 1): Pt will complete tub/shower with LRAD at CGA OT Short Term Goal 3 (Week 1): Pt will complete LB dressing with CGA with AE as necessary  Skilled Therapeutic Interventions/Progress Updates:  Skilled OT session completed to address ADL retraining and home management. Pt received seated in WC, agreeable to participate in therapy. Pt reports no pain.  Pt requested to void. Functional mobility completed with RW CGA to toilet with 3n1 over seat. Pt continent of bladder, toileting hygiene completed with CGA standing at grab bars when performing peri care. Pt requesting to don pajamas for nursing procedure following session. Pt removed LB clothing with Min A d/t surgical dressing on RLE, pt able to remove on LLE. Pt donned/doffed socks with CGA, completing task with 4 point sitting. Pt donned/doffed UB clothing with CGA, standing at RW to remove shirt. Pt completed functional mobility back to EOB, reporting anxiety about bowel mgmt. OT and pt collaborate to modify room and place BSC at EOB to support bowel mgmt. OT reinforces pt requires A from staff prior to transferring to [facility] to reduce risk for falls, pt verbalized understanding. EOB>WC stand pivot with RW. Pt completed light housekeeping in room at [facility] level to support safe discharge home and increase independence with IADLs. Pt completed bed making with Mod A to manage opposite side and light cleaning with set-up A to manage housekeeping items. Pt endorses independent meal mgmt at home and monitoring calories and sugar d/t diabetes. OT and pt collaborate on techniques to manage intake in home by  writing down daily. Pt requesting to speak with dietician at [facility] for more tips, MD notified. Session concluded with pt seated in [facility] and all needs within reach.  Therapy Documentation Precautions:  Precautions Precautions: Fall Recall of Precautions/Restrictions: Intact Restrictions Weight Bearing Restrictions Per Provider Order: No RLE Weight Bearing Per Provider Order: Weight bearing as tolerated   Therapy/Group: Individual Therapy  Mcdaniel [place], MS, OTR/L 04/10/2024, 7:56 AM

## 2024-04-11 LAB — URINALYSIS, W/ REFLEX TO CULTURE (INFECTION SUSPECTED)
Bacteria, UA: NONE SEEN
Bilirubin Urine: NEGATIVE
Glucose, UA: 100 mg/dL — AB
Ketones, ur: NEGATIVE mg/dL
Nitrite: NEGATIVE
Protein, ur: 30 mg/dL — AB
Specific Gravity, Urine: 1.015 (ref 1.005–1.030)
WBC, UA: >50 WBC/hpf (ref 0–5)
pH: 6.5 (ref 5.0–8.0)

## 2024-04-11 LAB — GLUCOSE, CAPILLARY
Glucose-Capillary: 180 mg/dL — ABNORMAL HIGH (ref 70–99)
Glucose-Capillary: 225 mg/dL — ABNORMAL HIGH (ref 70–99)
Glucose-Capillary: 187 mg/dL — ABNORMAL HIGH (ref 70–99)
Glucose-Capillary: 295 mg/dL — ABNORMAL HIGH (ref 70–99)

## 2024-04-11 MED ORDER — SULFAMETHOXAZOLE-TRIMETHOPRIM 800-160 MG PO TABS
1.0000 | ORAL_TABLET | Freq: Two times a day (BID) | ORAL | Status: DC
  Administered 2024-04-11 – 2024-04-13 (×4): 1 via ORAL
  Filled 2024-04-11 (×4): qty 1

## 2024-04-11 MED ORDER — INSULIN GLARGINE 100 UNIT/ML ~~LOC~~ SOLN
10.0000 [IU] | Freq: Two times a day (BID) | SUBCUTANEOUS | Status: DC
  Administered 2024-04-11 – 2024-04-13 (×4): 10 [IU] via SUBCUTANEOUS
  Filled 2024-04-11 (×5): qty 0.1

## 2024-04-11 MED ORDER — ALUM & MAG HYDROXIDE-SIMETH 200-200-20 MG/5ML PO SUSP
30.0000 mL | ORAL | Status: DC | PRN
  Administered 2024-04-11 – 2024-04-14 (×2): 30 mL via ORAL
  Filled 2024-04-11 (×3): qty 30

## 2024-04-11 MED ORDER — VITAMIN D 25 MCG (1000 UNIT) PO TABS
2000.0000 [IU] | ORAL_TABLET | Freq: Every day | ORAL | Status: DC
  Administered 2024-04-12 – 2024-04-14 (×3): 2000 [IU] via ORAL
  Filled 2024-04-11 (×2): qty 2

## 2024-04-11 NOTE — Progress Notes (Signed)
 "                                                        PROGRESS NOTE   Subjective/Complaints: C/o burning with urination, UA with reflex to culture ordered, ordered Bactrim  to start after UA is drawn, asked nursing to please bring her cranberry juice  ROS: +fungal infection of toes, +diabetic peripheral neuropathy, +pain posterior to right knee, +dysuria   Objective:   US  RT LOWER EXTREM LTD SOFT TISSUE NON VASCULAR Result Date: 04/10/2024 EXAM: US  RIGHT Lower Extremity Nonvascular Soft Tissue Ultrasound TECHNIQUE: Real-time ultrasound scan of the right knee with image documentation. COMPARISON: None available. CLINICAL HISTORY: Baker's cyst of knee, right. FINDINGS: SOFT TISSUES: Targeted ultrasound was performed over the posterior aspect of the right knee. No ultrasound abnormality identified. Specifically, no signs of focal fluid collection including abscess or popliteal fossa cyst. No solid mass. IMPRESSION: 1. No focal abnormality identified in the posterior aspect of the right knee, including no popliteal fossa cyst. Electronically signed by: Waddell Calk MD 04/10/2024 01:03 PM EST RP Workstation: [id number]   Recent Labs    04/08/24 1250  WBC 12.4*  HGB 10.3*  HCT 31.5*  PLT 345   No results for input(s): NA, K, CL, CO2, GLUCOSE, BUN, CREATININE, CALCIUM  in the last 72 hours.   Intake/Output Summary (Last 24 hours) at 04/11/2024 1043 Last data filed at 04/11/2024 0347 Gross per 24 hour  Intake 240 ml  Output 300 ml  Net -60 ml     Wound 04/06/24 1500 Pressure Injury Heel Right Deep Tissue Pressure Injury - Purple or maroon localized area of discolored intact skin or blood-filled blister due to damage of underlying soft tissue from pressure and/or shear. (Active)     Wound 04/06/24 1500 Pressure Injury Heel Left;Lateral Deep Tissue Pressure Injury - Purple or maroon localized area of discolored intact skin or blood-filled blister due to damage of  underlying soft tissue from pressure and/or shear. (Active)     Wound 04/06/24 1500 Pressure Injury Coccyx Deep Tissue Pressure Injury - Purple or maroon localized area of discolored intact skin or blood-filled blister due to damage of underlying soft tissue from pressure and/or shear. (Active)    Physical Exam: Vital Signs Blood pressure (!) 133/57, pulse (!) 56, temperature 98 F (36.7 C), temperature source Oral, resp. rate 18, height 4' 10 (1.473 m), weight 56.5 kg, SpO2 99%.  Gen: no distress, normal appearing HEENT: oral mucosa pink and moist, NCAT Cardio: Bradycardia Chest: normal effort, normal rate of breathing Abd: soft, non-distended Ext: no edema Psych: pleasant, normal affect Skin: fungal infection of toenails, right calf incision is healing well, has associated edema Neuro: Alert and oriented x3, 4/5 strength in RLE proximally and 3/5 strength distally, strength is otherwise 5/5 with the exception of 0/5 big toe extension on left foot, decreased sensation in bilateral feet, stable 12/21 MSK: TTP posterior to right knee with mild edema   Assessment/Plan: 1. Functional deficits which require 3+ hours per day of interdisciplinary therapy in a comprehensive inpatient rehab setting. Physiatrist is providing close team supervision and 24 hour management of active medical problems listed below. Physiatrist and rehab team continue to assess barriers to discharge/monitor patient progress toward functional and medical goals  Care Tool:  Bathing    Body parts bathed by  patient: Right arm, Left arm, Chest, Abdomen, Front perineal area, Buttocks, Right upper leg, Left upper leg, Face     Body parts n/a: Right lower leg, Left lower leg   Bathing assist Assist Level: Contact Guard/Touching assist     Upper Body Dressing/Undressing Upper body dressing   What is the patient wearing?: Hospital gown only    Upper body assist Assist Level: Set up assist    Lower Body  Dressing/Undressing Lower body dressing      What is the patient wearing?: Underwear/pull up, Pants     Lower body assist Assist for lower body dressing: Minimal Assistance - Patient > 75%     Toileting Toileting    Toileting assist Assist for toileting: Contact Guard/Touching assist     Transfers Chair/bed transfer  Transfers assist     Chair/bed transfer assist level: Contact Guard/Touching assist     Locomotion Ambulation   Ambulation assist   Ambulation activity did not occur: Safety/medical concerns (pain, weakness, decreased balance)  Assist level: Contact Guard/Touching assist Assistive device: Walker-rolling Max distance: 3ft   Walk 10 feet activity   Assist  Walk 10 feet activity did not occur: Safety/medical concerns (pain, weakness, decreased balance)  Assist level: Contact Guard/Touching assist Assistive device: Walker-rolling   Walk 50 feet activity   Assist Walk 50 feet with 2 turns activity did not occur: Safety/medical concerns (pain, weakness, decreased balance)         Walk 150 feet activity   Assist Walk 150 feet activity did not occur: Safety/medical concerns (pain, weakness, decreased balance)         Walk 10 feet on uneven surface  activity   Assist Walk 10 feet on uneven surfaces activity did not occur: Safety/medical concerns (pain, weakness, decreased balance)         Wheelchair     Assist Is the patient using a wheelchair?: Yes Type of Wheelchair: Manual Wheelchair activity did not occur: Safety/medical concerns (pain, weakness)  Wheelchair assist level: Supervision/Verbal cueing Max wheelchair distance: 148ft    Wheelchair 50 feet with 2 turns activity    Assist    Wheelchair 50 feet with 2 turns activity did not occur: Safety/medical concerns (pain, weakness)   Assist Level: Supervision/Verbal cueing   Wheelchair 150 feet activity     Assist  Wheelchair 150 feet activity did not  occur: Safety/medical concerns (pain, weakness)   Assist Level: Supervision/Verbal cueing   Blood pressure (!) 133/57, pulse (!) 56, temperature 98 F (36.7 C), temperature source Oral, resp. rate 18, height 4' 10 (1.473 m), weight 56.5 kg, SpO2 99%.   Medical Problem List and Plan: 1. Functional deficits secondary to Debility due to RLE ischemia s/p fasciotomy x4 with wounds on RLE             -patient may  shower if covers incisions             -ELOS/Goals: ~ 10-14 days- supervision to intermittent mod I             Chart and therapy notes reviewed, continue CIR, discussed patient's progress with therapy Grounds pass ordered Vitamin D3/Metanx/Vitamin B/C complex ordered F/u with me or Fidela in clinic within 1 month Would benefit from home health aide upon discharge   -Heparin  decreased to q12H 2/2 anemia, Hgb has now improved to 10.3, continue this dose             -antiplatelet therapy: Aspirin  and Plavix    This patient is capable  of making decisions on her own behalf.   2. Pain:  A) Diabetic peripheral neuropathy, chronic: As needed Tylenol , Voltaren , and oxycodone . 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 Discussed with patient getting MRI but she defers   3. Fungal infection of toes: antifungal cream added   5. Neuropsych/cognition:    6. RLE fasciotomy:  -Daily dressing changes to fasciotomies and L groin. Staples present on admission. Asked PA to please consult vascular regarding when staples can be removed and whether our staff can remove them  7. Fluids/Electrolytes/Nutrition: Monitor I&O and weight. Follow up labs CBC/CMP     8. Critical limb ischemia: Continue DAPT indefinitely.  Follow-up with [facility] outpatient   9.  PAD: Continue aspirin . Statin being held d/t rhabdomyolysis.    10.  Hypertension: d/c norvasc  since  DBP is in 40s, Coreg  6.25 mg twice daily. Monitor BP per protocol.   11.   Chronic hx of Adrenalectomy 2018: On home Cortef  10 mg daily and 5 mg nightly.    12.  Hypothyroidism: Conitnue Synthroid  75 mcg.    13.  Severe vitamin D  deficiency, level was 8 on 12/15, start D3 daily   14.  T2DM: A1c 10.3-monitor CBG AC/HS with SSI NovoLog , increase lantus  to 10 U BID   15. Constipation:              -Decrease miralax  to daily, continue colace daily. Simethicone  prn. LBM 12/18, d/c prn milk of mag given kidney function, sorbitol  ordered 12/20- messaged nursing to see if she had results with this  16. Dysuria: UA with reflex to culture ordered, Bactrim  ordered to start after UA is drawn, asked nursing to please bring her cranberry juice  LOS: 5 days A FACE TO FACE EVALUATION WAS PERFORMED  Audrey [name] Alaija Ruble 04/11/2024, 10:43 AM     "

## 2024-04-11 NOTE — Progress Notes (Signed)
 Occupational Therapy Session Note  Patient Details  Name: Audrey [name] MRN: [id number] Date of Birth: [birth year]  Today's Date: 04/11/2024 OT Individual Time: 1000-1119 OT Individual Time Calculation (min): 79 min    Short Term Goals: Week 1:  OT Short Term Goal 1 (Week 1): Pt will complete bathing at SBA with AE as necessary OT Short Term Goal 2 (Week 1): Pt will complete tub/shower with LRAD at CGA OT Short Term Goal 3 (Week 1): Pt will complete LB dressing with CGA with AE as necessary  Skilled Therapeutic Interventions/Progress Updates:      Therapy Documentation Precautions:  Precautions Precautions: Fall Recall of Precautions/Restrictions: Intact Restrictions Weight Bearing Restrictions Per Provider Order: No RLE Weight Bearing Per Provider Order: Weight bearing as tolerated General: Pt seated in W/C upon OT arrival, agreeable to OT.  Pain:  6/10 pain reported in Rt foot, activity, intermittent rest breaks, distractions provided for pain management, pt reports tolerable to proceed.   Exercises: Pt demonstrated community mobility with W/C in order to prepare for community integration at D/C. Pt propelled in W/C requiring PRN rest breaks d/t arm fatigue. Pt practices maneuvering around obstacles and propelling at further distances.   Other Treatments: Pt endorses burning when urinating and thinks she may have UTI. OT messaging MD. Pt also wondering of natural ways to decrease chance of UTIs. MD visiting during session and educating pt on holistic ways to reduce chance of UTIs and diabetes management. OT educating pt on elevating BLEs in order to manage edema and pain d/t pt's c/o pain.   At end of session, OT assisting nsg placing lidocane patches. OT assisting with standing pt at Min A with no RW, still pt guarded with RLE d/t pain. Pt seated in W/C at end of session with W/C alarm donned, call light within reach and 4Ps assessed.    Therapy/Group: Individual  Therapy  Camie Hoe, OTD, OTR/L 04/11/2024, 12:36 PM

## 2024-04-12 DIAGNOSIS — K5900 Constipation, unspecified: Secondary | ICD-10-CM

## 2024-04-12 DIAGNOSIS — N3000 Acute cystitis without hematuria: Secondary | ICD-10-CM

## 2024-04-12 DIAGNOSIS — I10 Essential (primary) hypertension: Secondary | ICD-10-CM

## 2024-04-12 DIAGNOSIS — E1165 Type 2 diabetes mellitus with hyperglycemia: Secondary | ICD-10-CM

## 2024-04-12 DIAGNOSIS — Z794 Long term (current) use of insulin: Secondary | ICD-10-CM

## 2024-04-12 DIAGNOSIS — D72829 Elevated white blood cell count, unspecified: Secondary | ICD-10-CM

## 2024-04-12 LAB — GLUCOSE, CAPILLARY
Glucose-Capillary: 119 mg/dL — ABNORMAL HIGH (ref 70–99)
Glucose-Capillary: 193 mg/dL — ABNORMAL HIGH (ref 70–99)
Glucose-Capillary: 256 mg/dL — ABNORMAL HIGH (ref 70–99)
Glucose-Capillary: 197 mg/dL — ABNORMAL HIGH (ref 70–99)

## 2024-04-12 LAB — URINE CULTURE: Culture: <10000 — AB

## 2024-04-12 MED ORDER — SORBITOL 70 % SOLN
60.0000 mL | Freq: Once | Status: AC
  Administered 2024-04-12: 60 mL via ORAL
  Filled 2024-04-12: qty 60

## 2024-04-12 NOTE — Progress Notes (Signed)
 "                                                        PROGRESS NOTE   Subjective/Complaints: Felt warm last night. She was started on bactrim  yesterday for UTI.  Reports no BM in several days.   ROS: +fungal infection of toes, +diabetic peripheral neuropathy, +pain posterior to right knee, +dysuria + chronic constipation  Objective:   No results found.  No results for input(s): WBC, HGB, HCT, PLT in the last 72 hours.  No results for input(s): NA, K, CL, CO2, GLUCOSE, BUN, CREATININE, CALCIUM  in the last 72 hours.   Intake/Output Summary (Last 24 hours) at 04/12/2024 1223 Last data filed at 04/12/2024 0804 Gross per 24 hour  Intake 960 ml  Output --  Net 960 ml     Wound 04/06/24 1500 Pressure Injury Heel Right Deep Tissue Pressure Injury - Purple or maroon localized area of discolored intact skin or blood-filled blister due to damage of underlying soft tissue from pressure and/or shear. (Active)     Wound 04/06/24 1500 Pressure Injury Heel Left;Lateral Deep Tissue Pressure Injury - Purple or maroon localized area of discolored intact skin or blood-filled blister due to damage of underlying soft tissue from pressure and/or shear. (Active)     Wound 04/06/24 1500 Pressure Injury Coccyx Deep Tissue Pressure Injury - Purple or maroon localized area of discolored intact skin or blood-filled blister due to damage of underlying soft tissue from pressure and/or shear. (Active)    Physical Exam: Vital Signs Blood pressure (!) 153/85, pulse 67, temperature 97.6 F (36.4 C), temperature source Oral, resp. rate 17, height 4' 10 (1.473 m), weight 56.5 kg, SpO2 100%.  Gen: NAD HEENT: oral mucosa pink and moist, NCAT Cardio: RRR Chest: CTAB, normal effort, normal rate of breathing Abd: soft, non-distended, +BS Psych: pleasant, normal affect Skin: fungal infection of toenails, right calf incision is healing well- not visualized today 12/22 has associated  edema Neuro: Alert and oriented x3, 4/5 strength in RLE proximally and 3/5 strength distally, strength is otherwise 5/5 with the exception of 0/5 big toe extension on left foot, decreased sensation in bilateral feet, stable 12/22 MSK: TTP posterior to right knee with mild edema   Assessment/Plan: 1. Functional deficits which require 3+ hours per day of interdisciplinary therapy in a comprehensive inpatient rehab setting. Physiatrist is providing close team supervision and 24 hour management of active medical problems listed below. Physiatrist and rehab team continue to assess barriers to discharge/monitor patient progress toward functional and medical goals  Care Tool:  Bathing    Body parts bathed by patient: Right arm, Left arm, Chest, Abdomen, Front perineal area, Buttocks, Right upper leg, Left upper leg, Face     Body parts n/a: Right lower leg, Left lower leg   Bathing assist Assist Level: Contact Guard/Touching assist     Upper Body Dressing/Undressing Upper body dressing   What is the patient wearing?: Pull over shirt    Upper body assist Assist Level: Set up assist    Lower Body Dressing/Undressing Lower body dressing      What is the patient wearing?: Pants     Lower body assist Assist for lower body dressing: Supervision/Verbal cueing     Toileting Toileting    Toileting assist Assist for toileting: Contact  Guard/Touching assist     Transfers Chair/bed transfer  Transfers assist     Chair/bed transfer assist level: Contact Guard/Touching assist     Locomotion Ambulation   Ambulation assist   Ambulation activity did not occur: Safety/medical concerns (pain, weakness, decreased balance)  Assist level: Contact Guard/Touching assist Assistive device: Walker-rolling Max distance: 71ft   Walk 10 feet activity   Assist  Walk 10 feet activity did not occur: Safety/medical concerns (pain, weakness, decreased balance)  Assist level: Contact  Guard/Touching assist Assistive device: Walker-rolling   Walk 50 feet activity   Assist Walk 50 feet with 2 turns activity did not occur: Safety/medical concerns (pain, weakness, decreased balance)         Walk 150 feet activity   Assist Walk 150 feet activity did not occur: Safety/medical concerns (pain, weakness, decreased balance)         Walk 10 feet on uneven surface  activity   Assist Walk 10 feet on uneven surfaces activity did not occur: Safety/medical concerns (pain, weakness, decreased balance)         Wheelchair     Assist Is the patient using a wheelchair?: Yes Type of Wheelchair: Manual Wheelchair activity did not occur: Safety/medical concerns (pain, weakness)  Wheelchair assist level: Supervision/Verbal cueing Max wheelchair distance: 176ft    Wheelchair 50 feet with 2 turns activity    Assist    Wheelchair 50 feet with 2 turns activity did not occur: Safety/medical concerns (pain, weakness)   Assist Level: Supervision/Verbal cueing   Wheelchair 150 feet activity     Assist  Wheelchair 150 feet activity did not occur: Safety/medical concerns (pain, weakness)   Assist Level: Supervision/Verbal cueing   Blood pressure (!) 153/85, pulse 67, temperature 97.6 F (36.4 C), temperature source Oral, resp. rate 17, height 4' 10 (1.473 m), weight 56.5 kg, SpO2 100%.   Medical Problem List and Plan: 1. Functional deficits secondary to Debility due to RLE ischemia s/p fasciotomy x4 with wounds on RLE             -patient may  shower if covers incisions             -ELOS/Goals: ~ 10-14 days- supervision to intermittent mod I             Chart and therapy notes reviewed, continue CIR, discussed patient's progress with therapy Grounds pass ordered Vitamin D3/Metanx/Vitamin B/C complex ordered F/u with me or Fidela in clinic within 1 month Would benefit from home health aide upon discharge   -Heparin  decreased to q12H 2/2 anemia, Hgb has  now improved to 10.3, continue this dose             -antiplatelet therapy: Aspirin  and Plavix    This patient is capable of making decisions on her own behalf.   2. Pain:  A) Diabetic peripheral neuropathy, chronic: As needed Tylenol , Voltaren , and oxycodone . 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 Discussed with patient getting MRI but she defers   3. Fungal infection of toes: antifungal cream added   5. Neuropsych/cognition:    6. RLE fasciotomy:  -Daily dressing changes to fasciotomies and L groin. Staples present on admission. Asked PA to please consult vascular regarding when staples can be removed and whether our staff can remove them  7. Fluids/Electrolytes/Nutrition: Monitor I&O and weight. Follow up labs CBC/CMP     8. Critical  limb ischemia: Continue DAPT indefinitely.  Follow-up with [facility] outpatient   9.  PAD: Continue aspirin . Statin being held d/t rhabdomyolysis.    10.  Hypertension: d/c norvasc  since DBP is in 40s, Coreg  6.25 mg twice daily. Monitor BP per protocol.  12/22 continue to monitor trend      04/12/2024    4:15 AM 04/11/2024    8:00 PM 04/11/2024    6:19 PM  Vitals with BMI  Systolic 153 157   Diastolic 85 66   Pulse 67 67 72      11.   Chronic hx of Adrenalectomy 2018: On home Cortef  10 mg daily and 5 mg nightly.    12.  Hypothyroidism: Conitnue Synthroid  75 mcg.    13.  Severe vitamin D  deficiency, level was 8 on 12/15, start D3 daily   14.  T2DM: A1c 10.3-monitor CBG AC/HS with SSI NovoLog , increase lantus  to 10 U BID  12/22 monitor response to medication change  CBG (last 3)  Recent Labs    04/11/24 2111 04/12/24 0536 04/12/24 1155  GLUCAP 295* 119* 193*      15. Constipation:              -Decrease miralax  to daily, continue colace daily. Simethicone  prn. LBM 12/18, d/c prn milk of  mag given kidney function, sorbitol  ordered 12/20- messaged nursing to see if she had results with this  -12/22 she tried 30ml sorbitol  2 days ago, will order 60ml dose  16. UTI: UA with reflex to culture ordered, Bactrim  ordered to start after UA is drawn, asked nursing to please bring her cranberry juice  -12/22 monitor urine culture, continue current  17. Leukocytosis/Anemia  Recheck tomorrow ordered  LOS: 6 days A FACE TO FACE EVALUATION WAS PERFORMED  Murray Collier 04/12/2024, 12:23 PM     "

## 2024-04-12 NOTE — Progress Notes (Signed)
 " Progress Note    04/12/2024 6:41 AM   Subjective:  says she has a piece of wicking fabric on incision.    Afebrile   Vitals:   04/11/24 2000 04/12/24 0415  BP: (!) 157/66 (!) 153/85  Pulse: 67 67  Resp: 16 17  Temp: 97.7 F (36.5 C) 97.6 F (36.4 C)  SpO2: 100% 100%    Physical Exam: General:  no distress; sitting up in bed eating breakfast.  Lungs:  non labored Incisions:  left groin moist.  She has ABD in place but it is not over incision.   Extremities:  bilateral feet are warm and well perfused.  Motor/sensory in tact Abdomen:  soft  CBC    Component Value Date/Time   WBC 12.4 (H) 04/08/2024 1250   RBC 3.40 (L) 04/08/2024 1250   HGB 10.3 (L) 04/08/2024 1250   HGB 12.6 12/03/2023 1359   HGB 11.2 (L) 04/04/2017 0830   HCT 31.5 (L) 04/08/2024 1250   HCT 35.2 04/04/2017 0830   PLT 345 04/08/2024 1250   PLT 233 12/03/2023 1359   PLT 250 04/04/2017 0830   MCV 92.6 04/08/2024 1250   MCV 107.6 (H) 04/04/2017 0830   MCH 30.3 04/08/2024 1250   MCHC 32.7 04/08/2024 1250   RDW 15.2 04/08/2024 1250   RDW 14.0 04/04/2017 0830   LYMPHSABS 1.8 04/08/2024 1250   LYMPHSABS 1.6 04/04/2017 0830   MONOABS 1.1 (H) 04/08/2024 1250   MONOABS 0.4 04/04/2017 0830   EOSABS 0.2 04/08/2024 1250   EOSABS 0.1 04/04/2017 0830   BASOSABS 0.1 04/08/2024 1250   BASOSABS 0.0 04/04/2017 0830    BMET    Component Value Date/Time   NA 139 04/07/2024 0142   NA 140 04/04/2017 0830   K 3.9 04/07/2024 0142   K 4.1 04/04/2017 0830   CL 103 04/07/2024 0142   CO2 29 04/07/2024 0142   CO2 21 (L) 04/04/2017 0830   GLUCOSE 166 (H) 04/07/2024 0142   GLUCOSE 107 04/04/2017 0830   BUN 25 (H) 04/07/2024 0142   BUN 37.0 (H) 04/04/2017 0830   CREATININE 1.44 (H) 04/07/2024 0142   CREATININE 1.67 (H) 12/03/2023 1359   CREATININE 2.2 (H) 04/04/2017 0830   CALCIUM  8.1 (L) 04/07/2024 0142   CALCIUM  10.1 04/04/2017 0830   GFRNONAA 36 (L) 04/07/2024 0142   GFRNONAA 31 (L) 12/03/2023 1359    GFRAA 30 (L) 01/19/2020 0835    INR    Component Value Date/Time   INR 1.1 09/01/2018 1850     Intake/Output Summary (Last 24 hours) at 04/12/2024 0641 Last data filed at 04/11/2024 1739 Gross per 24 hour  Intake 960 ml  Output --  Net 960 ml      Assessment/Plan:  83 y.o. female is s/p:  Angiogram with right peroneal, TPT, popliteal artery angioplasty, thrombolysis 04/02/2024, angiogram with right peroneal artery and TPT angioplasty, stent right SFA 04/03/2024, left iliofemoral endarterectomy with bovine patch angioplasty, 4 compartment fasciotomy RLE 04/03/2024.   -left groin moist.  Discussed that she should not use the gray fabric to wick moisture and just used dry gauze over incision.  There was xeroform over this incision.  Would prefer nothing except dry gauze and this keeps this area moist.   -removed dressing from RLE and those incisions look good, also with xeroform.   -please do not place xeroform over incisions.  Ok to leave RLE incisions open to air.  If she has some drainage, please use gauze and ace wrap, which  I have placed in the room.  Also placed two boats of gauze at her bedside to place gauze in left groin to wick moisture.  She knows to changes this out if it falls out.     Lucie Apt, PA-C [facility] [phone] 04/12/2024 6:41 AM    "

## 2024-04-12 NOTE — Plan of Care (Signed)
" °  RD consulted for nutrition education regarding diabetes.   Lab Results  Component Value Date   HGBA1C 10.3 (H) 04/02/2024    RD provided Power of Protein in Diabetes and Consistent Carbohydrate Intake handout from the Academy of [employer]. Discussed different food groups and their effects on blood sugar, emphasizing carbohydrate-containing foods. Provided list of carbohydrates and recommended serving sizes of common foods. Discussed importance of protein intake to slow glucose spike following meal. Encouraged using Plate Method for Diabetes to build meals.   Discussed importance of controlled and consistent carbohydrate intake throughout the day. Provided examples of ways to balance meals/snacks and encouraged intake of high-fiber, whole grain complex carbohydrates. 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 Teach back method used.  Pt reports concern about carb intake of meals provided in CIR. Pt trying to track carb intake when ordering but has a difficult time as menu does not show carb amounts of each food item. Unfortunately, RD unable to provide menu with carb counts on it but encouraged pt to ask host/hostess for carb tally when ordering. Pt also reports she was going to start insulin  therapy prior to admission but got sick before she started. She now feels unprepared to be discharged on insulin , would benefit from education for insulin  therapy before discharge, discussed with RN.    Expect fair compliance.  Body mass index is 26.03 kg/m. Pt meets criteria for overweight based on current BMI.  Current diet order is HH/Carb Mod, patient is consuming approximately 85-100% of meals at this time. Labs and medications reviewed. No further nutrition interventions warranted at this time. RD contact information provided. If additional nutrition issues arise,  please re-consult RD.    Josette Glance, MS, RDN, LDN Clinical Dietitian I Please reach out via secure chat   "

## 2024-04-12 NOTE — Progress Notes (Signed)
 Occupational Therapy Session Note  Patient Details  Name: Audrey [name] MRN: [id number] Date of Birth: [birth year]  Today's Date: 04/12/2024 OT Individual Time: 9149-9069 & [id number] OT Individual Time Calculation (min): 40 min & 70 min   Short Term Goals: Week 1:  OT Short Term Goal 1 (Week 1): Pt will complete bathing at SBA with AE as necessary OT Short Term Goal 2 (Week 1): Pt will complete tub/shower with LRAD at CGA OT Short Term Goal 3 (Week 1): Pt will complete LB dressing with CGA with AE as necessary  Skilled Therapeutic Interventions/Progress Updates:  Session 1 Skilled OT intervention completed with focus on ADL retraining, functional endurance. Pt received seated in w/c with nursing present for meds, agreeable to session. 6/10 pain reported in bilateral feet; pre-medicated. OT offered rest breaks, repositioning throughout for pain reduction.  Pt required encouragement to get dressed. Doffed several gowns min A, donned shirt set up A, threaded pants and shoes with supervision using reacher following cues to do so, with supervision needed for sit > stand with RW, CGA/supervision for dynamic standing balance. Mod I for oral care.  L thigh with recent Lovenox  injection and noted to be bleeding through bandage. OT applied pressure and foam dressing with nurse made aware.    Ambulated with supervision/CGA about 50 ft x2 using RW with w/c follow for fatigue. Safety cues needed to wait until w/c was locked prior to sitting and discussed how this translates to home and ensuring she sits in chair without wheels and/or with arms for efficiency with sit > stand due to pt dependence.  Pt remained seated in w/c, with chair alarm on/activated, and with all needs in reach at end of session.  Session 2 Skilled OT intervention completed with focus on functional ambulatory endurance, tub transfers. Pt received seated in w/c, agreeable to session. Discomfort reported in L groin and RLE.  Pre-medicated. OT offered rest breaks, repositioning for pain reduction.  Pt completed all sit > stands and ambulatory transfers using RW with supervision during session. Did wobble on RLE, but was more accepting of weight than in prior days. Ambulated 100 ft, with extended rest seated following. Retrieved warm beverage at nurses station for christmas activity with pt choosing low sugar option appropriately according to diet protocol.   Education provided on TTB and padded BSC as options for her tub shower. Pt able to return demo supervision level transfer with TTB but discussed that the padded would give off loading to her sore regions/wounds. Discussed curtain management and fall prevention.   Ambulated 50 ft, then completed all toileting steps for urinary void with supervision. Pt remained seated in w/c, with belt alarm on/activated, and with all needs in reach at end of session.   Therapy Documentation Precautions:  Precautions Precautions: Fall Recall of Precautions/Restrictions: Intact Restrictions Weight Bearing Restrictions Per Provider Order: No RLE Weight Bearing Per Provider Order: Weight bearing as tolerated    Therapy/Group: Individual Therapy  [name], MS, OTR/L  04/12/2024, 3:15 PM

## 2024-04-12 NOTE — Progress Notes (Signed)
 Patient ID: Audrey [name], female   DOB: 22-Dec-1940, 83 y.o.   MRN: [id number] Met with the patient and family following up on report to dietician; was about to start insulin  prior to her hospitalization but ended up in the hospital before she started and now she doesn't really know what to do once she goes home. Reviewed blood sugar monitoring; has a meter but has lost the lancet holder since admission.  Reviewed Lantus  insulin , administration of insulin  using a prefilled syringe and insulin  care/storage and disposal of the used needles including priming the pen and changing the needles.  Patient expressed interest in a continuous glucose monitor system; deferred to her endocrinologist OP.  Patient would benefit from continued practice administering insulin . [name]

## 2024-04-12 NOTE — Progress Notes (Signed)
 Physical Therapy Session Note  Patient Details  Name: Audrey [name] MRN: [id number] Date of Birth: 07-07-1940  Today's Date: 04/12/2024 PT Individual Time: 0930-1030  PT Individual Time Calculation (min): 60 min  Short Term Goals: Week 1:  PT Short Term Goal 1 (Week 1): pt will perform bed<>chair transfers with supervision PT Short Term Goal 2 (Week 1): pt will ambulate 11ft with LRAD and min A PT Short Term Goal 3 (Week 1): pt will transfer sit<>stand with LRAD and CGA  Skilled Therapeutic Interventions/Progress Updates:  Chart reviewed and pt agreeable to therapy. Pt received seated in WC with 7/10 c/o pain in B feet and same pain level in groin. Session focused on functional transfers, balance, and ambulation to promote safe home mobility and access. Pt initiated session with amb around room for 40 mins at distances of 20-73ft and durations of 1-8 mins on feet  before rests to organize room and drawers using close S + RW for amb and balance including reaching outside base of support, picking items from floor, and changing clothes in sitting. Pt then completed amb of 24ft + 16ft using S + RW. Nursing entered room to change bandaging at toe. Pt missed 15 mins of therapy due to nursing care. Session education emphasized CIR fall prevention. At end of session, pt was left seated in [facility] with alarm engaged, nurse call bell and all needs in reach.     Therapy Documentation Precautions:  Precautions Precautions: Fall Recall of Precautions/Restrictions: Intact Restrictions Weight Bearing Restrictions Per Provider Order: No RLE Weight Bearing Per Provider Order: Weight bearing as tolerated General: PT Amount of Missed Time (min): 15 Minutes PT Missed Treatment Reason: Wound care;Nursing care    Therapy/Group: Individual Therapy   [name] 04/12/2024, 10:46 AM

## 2024-04-13 LAB — BASIC METABOLIC PANEL WITH GFR
Anion gap: 9 (ref 5–15)
BUN: 40 mg/dL — ABNORMAL HIGH (ref 8–23)
CO2: 21 mmol/L — ABNORMAL LOW (ref 22–32)
Calcium: 8.7 mg/dL — ABNORMAL LOW (ref 8.9–10.3)
Chloride: 104 mmol/L (ref 98–111)
Creatinine, Ser: 2.11 mg/dL — ABNORMAL HIGH (ref 0.44–1.00)
GFR, Estimated: 23 mL/min — ABNORMAL LOW
Glucose, Bld: 114 mg/dL — ABNORMAL HIGH (ref 70–99)
Potassium: 5.5 mmol/L — ABNORMAL HIGH (ref 3.5–5.1)
Sodium: 134 mmol/L — ABNORMAL LOW (ref 135–145)

## 2024-04-13 LAB — GLUCOSE, CAPILLARY
Glucose-Capillary: 123 mg/dL — ABNORMAL HIGH (ref 70–99)
Glucose-Capillary: 217 mg/dL — ABNORMAL HIGH (ref 70–99)
Glucose-Capillary: 235 mg/dL — ABNORMAL HIGH (ref 70–99)
Glucose-Capillary: 176 mg/dL — ABNORMAL HIGH (ref 70–99)

## 2024-04-13 LAB — CBC
HCT: 28.7 % — ABNORMAL LOW (ref 36.0–46.0)
Hemoglobin: 9.1 g/dL — ABNORMAL LOW (ref 12.0–15.0)
MCH: 31 pg (ref 26.0–34.0)
MCHC: 31.7 g/dL (ref 30.0–36.0)
MCV: 97.6 fL (ref 80.0–100.0)
Platelets: 359 K/uL (ref 150–400)
RBC: 2.94 MIL/uL — ABNORMAL LOW (ref 3.87–5.11)
RDW: 15.5 % (ref 11.5–15.5)
WBC: 13 K/uL — ABNORMAL HIGH (ref 4.0–10.5)
nRBC: 0 % (ref 0.0–0.2)

## 2024-04-13 MED ORDER — FLUCONAZOLE 150 MG PO TABS
150.0000 mg | ORAL_TABLET | Freq: Every day | ORAL | Status: AC
  Administered 2024-04-13 – 2024-04-15 (×3): 150 mg via ORAL
  Filled 2024-04-13 (×3): qty 1

## 2024-04-13 MED ORDER — AMLODIPINE BESYLATE 2.5 MG PO TABS
2.5000 mg | ORAL_TABLET | Freq: Every day | ORAL | Status: DC
  Administered 2024-04-13 – 2024-04-14 (×2): 2.5 mg via ORAL
  Filled 2024-04-13 (×2): qty 1

## 2024-04-13 MED ORDER — SODIUM CHLORIDE 0.9 % IV SOLN: INTRAVENOUS | Status: AC

## 2024-04-13 MED ORDER — SODIUM ZIRCONIUM CYCLOSILICATE 5 G PO PACK
5.0000 g | PACK | Freq: Once | ORAL | Status: AC
  Administered 2024-04-13: 5 g via ORAL
  Filled 2024-04-13: qty 1

## 2024-04-13 MED ORDER — INSULIN GLARGINE 100 UNIT/ML ~~LOC~~ SOLN
11.0000 [IU] | Freq: Two times a day (BID) | SUBCUTANEOUS | Status: DC
  Administered 2024-04-13 – 2024-04-14 (×2): 11 [IU] via SUBCUTANEOUS
  Filled 2024-04-13 (×3): qty 0.11

## 2024-04-13 NOTE — Progress Notes (Signed)
 Occupational Therapy Weekly Progress Note  Patient Details  Name: Audrey [name] MRN: [id number] Date of Birth: June 30, 1940  Beginning of progress report period: April 07, 2024 End of progress report period: April 13, 2024   Patient has met 3 of 3 short term goals. Pt is making steady progress towards LTGs. She is able to bathe with supervision, UB dress with set up A, LB dress with min A and completes toileting with supervision. In the past week, pt has been mostly limited by generalized pain in RLE/L groin as well as fatigue. In addition, pt continues to demonstrate dynamic standing balance, functional endurance, and GMC deficits resulting in difficulty completing BADL tasks without increased physical assist. Pt will benefit from continued skilled OT services to focus on mentioned deficits.  Patient continues to demonstrate the following deficits: muscle weakness, decreased cardiorespiratoy endurance, decreased coordination, and decreased standing balance and therefore will continue to benefit from skilled OT intervention to enhance overall performance with BADL and iADL.  Patient progressing toward long term goals..  Continue plan of care.  OT Short Term Goals Week 1:  OT Short Term Goal 1 (Week 1): Pt will complete bathing at SBA with AE as necessary OT Short Term Goal 1 - Progress (Week 1): Met OT Short Term Goal 2 (Week 1): Pt will complete tub/shower with LRAD at CGA OT Short Term Goal 2 - Progress (Week 1): Met OT Short Term Goal 3 (Week 1): Pt will complete LB dressing with CGA with AE as necessary OT Short Term Goal 3 - Progress (Week 1): Met Week 2:  OT Short Term Goal 1 (Week 2): STG = LTG due to ELOS    Miguel Christiana E Miriya Cloer, MS, OTR/L  04/13/2024, 9:59 AM

## 2024-04-13 NOTE — Progress Notes (Signed)
 Physical Therapy Session Note  Patient Details  Name: Audrey [name] MRN: [id number] Date of Birth: Dec 14, 1940  Today's Date: 04/13/2024 PT Individual Time: 1115-1200 PT Individual Time Calculation (min): 45 min   Short Term Goals: Week 1:  PT Short Term Goal 1 (Week 1): pt will perform bed<>chair transfers with supervision PT Short Term Goal 2 (Week 1): pt will ambulate 47ft with LRAD and min A PT Short Term Goal 3 (Week 1): pt will transfer sit<>stand with LRAD and CGA  Skilled Therapeutic Interventions/Progress Updates:      Pt presents in bed and in agreement to therapy treatment. She has no complaints of resting pain.   Supine<>sitting EOB with supervision with HOB raised. Able to don her shoes with setupA as she sat EOB. Sit<>Stand to RW with supervision. Ambulates with RW at supervision level from her room to day room gym, 155'. Pt with x1 standing rest break that was brief in the hallway. Distance limited by fatigue and pt reporting mild B feet soreness after long distance ambulation. Pt completed seated level activity with Holiday crafts to help boost mood as she's upset she's missing the holiday's with her family. Worked on problem solving, sequencing, and executive functioning. Pt requesting w/c transport back to her room due to fatigue from earlier walk. Returned to her room and she was assisted to bed with stand pivot transfer using her RW with CGA. Ended session in bed with needs met.   Therapy Documentation Precautions:  Precautions Precautions: Fall Recall of Precautions/Restrictions: Intact Restrictions Weight Bearing Restrictions Per Provider Order: No RLE Weight Bearing Per Provider Order: Weight bearing as tolerated General:       Therapy/Group: Individual Therapy  Govanni Plemons P Birdie Beveridge 04/13/2024, 11:26 AM

## 2024-04-13 NOTE — Progress Notes (Signed)
 "                                                        PROGRESS NOTE   Subjective/Complaints: Possible yeast infection of left groin incision, diflucan  started, patient does not feel dysuria has improved with antibiotic, feels this is a yeast infection as well  ROS: +fungal infection of toes, +diabetic peripheral neuropathy, +pain posterior to right knee, +dysuria- conitnues   Objective:   No results found.  Recent Labs    04/13/24 0417  WBC 13.0*  HGB 9.1*  HCT 28.7*  PLT 359   Recent Labs    04/13/24 0417  NA 134*  K 5.5*  CL 104  CO2 21*  GLUCOSE 114*  BUN 40*  CREATININE 2.11*  CALCIUM  8.7*     Intake/Output Summary (Last 24 hours) at 04/13/2024 1036 Last data filed at 04/13/2024 0830 Gross per 24 hour  Intake 680 ml  Output --  Net 680 ml     Wound 04/06/24 1500 Pressure Injury Heel Right Deep Tissue Pressure Injury - Purple or maroon localized area of discolored intact skin or blood-filled blister due to damage of underlying soft tissue from pressure and/or shear. (Active)     Wound 04/06/24 1500 Pressure Injury Heel Left;Lateral Deep Tissue Pressure Injury - Purple or maroon localized area of discolored intact skin or blood-filled blister due to damage of underlying soft tissue from pressure and/or shear. (Active)     Wound 04/06/24 1500 Pressure Injury Coccyx Deep Tissue Pressure Injury - Purple or maroon localized area of discolored intact skin or blood-filled blister due to damage of underlying soft tissue from pressure and/or shear. (Active)    Physical Exam: Vital Signs Blood pressure (!) 149/74, pulse 68, temperature 98.5 F (36.9 C), temperature source Oral, resp. rate 17, height 4' 10 (1.473 m), weight 56.5 kg, SpO2 99%.  Gen: no distress, normal appearing HEENT: oral mucosa pink and moist, NCAT Cardio: Bradycardia Chest: normal effort, normal rate of breathing Abd: soft, non-distended Ext: no edema Psych: pleasant, normal affect Skin:  fungal infection of toenails, right calf incision is healing well, has associated edema, yeast infection of left groin incision Neuro: Alert and oriented x3, 4/5 strength in RLE proximally and 3/5 strength distally, strength is otherwise 5/5 with the exception of 0/5 big toe extension on left foot, decreased sensation in bilateral feet, stable 12/23 MSK: TTP posterior to right knee with mild edema   Assessment/Plan: 1. Functional deficits which require 3+ hours per day of interdisciplinary therapy in a comprehensive inpatient rehab setting. Physiatrist is providing close team supervision and 24 hour management of active medical problems listed below. Physiatrist and rehab team continue to assess barriers to discharge/monitor patient progress toward functional and medical goals  Care Tool:  Bathing    Body parts bathed by patient: Right arm, Left arm, Chest, Abdomen, Front perineal area, Buttocks, Right upper leg, Left upper leg, Face     Body parts n/a: Right lower leg, Left lower leg   Bathing assist Assist Level: Contact Guard/Touching assist     Upper Body Dressing/Undressing Upper body dressing   What is the patient wearing?: Pull over shirt    Upper body assist Assist Level: Set up assist    Lower Body Dressing/Undressing Lower body dressing      What  is the patient wearing?: Pants     Lower body assist Assist for lower body dressing: Supervision/Verbal cueing     Toileting Toileting    Toileting assist Assist for toileting: Supervision/Verbal cueing     Transfers Chair/bed transfer  Transfers assist     Chair/bed transfer assist level: Contact Guard/Touching assist     Locomotion Ambulation   Ambulation assist   Ambulation activity did not occur: Safety/medical concerns (pain, weakness, decreased balance)  Assist level: Contact Guard/Touching assist Assistive device: Walker-rolling Max distance: 94ft   Walk 10 feet activity   Assist  Walk 10  feet activity did not occur: Safety/medical concerns (pain, weakness, decreased balance)  Assist level: Contact Guard/Touching assist Assistive device: Walker-rolling   Walk 50 feet activity   Assist Walk 50 feet with 2 turns activity did not occur: Safety/medical concerns (pain, weakness, decreased balance)         Walk 150 feet activity   Assist Walk 150 feet activity did not occur: Safety/medical concerns (pain, weakness, decreased balance)         Walk 10 feet on uneven surface  activity   Assist Walk 10 feet on uneven surfaces activity did not occur: Safety/medical concerns (pain, weakness, decreased balance)         Wheelchair     Assist Is the patient using a wheelchair?: Yes Type of Wheelchair: Manual Wheelchair activity did not occur: Safety/medical concerns (pain, weakness)  Wheelchair assist level: Supervision/Verbal cueing Max wheelchair distance: 111ft    Wheelchair 50 feet with 2 turns activity    Assist    Wheelchair 50 feet with 2 turns activity did not occur: Safety/medical concerns (pain, weakness)   Assist Level: Supervision/Verbal cueing   Wheelchair 150 feet activity     Assist  Wheelchair 150 feet activity did not occur: Safety/medical concerns (pain, weakness)   Assist Level: Supervision/Verbal cueing   Blood pressure (!) 149/74, pulse 68, temperature 98.5 F (36.9 C), temperature source Oral, resp. rate 17, height 4' 10 (1.473 m), weight 56.5 kg, SpO2 99%.   Medical Problem List and Plan: 1. Functional deficits secondary to Debility due to RLE ischemia s/p fasciotomy x4 with wounds on RLE             -patient may  shower if covers incisions             -ELOS/Goals: ~ 10-14 days- supervision to intermittent mod I             Chart and therapy notes reviewed, continue CIR, discussed patient's progress with therapy Grounds pass ordered Vitamin D3/Metanx/Vitamin B/C complex ordered F/u with me or Fidela in clinic  within 1 month Would benefit from home health aide upon discharge   -Heparin  decreased to q12H 2/2 anemia, Hgb has now improved to 10.3, continue this dose             -antiplatelet therapy: Aspirin  and Plavix    This patient is capable of making decisions on her own behalf.   2. Pain:  A) Diabetic peripheral neuropathy, chronic: As needed Tylenol , Voltaren , and oxycodone . 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 Discussed with patient getting MRI but she defers   3. Fungal infection of toes: antifungal cream added   5. Yeast infection of left groin incision: diflucan  started   6. RLE fasciotomy:  -Daily dressing changes to fasciotomies and L groin. Staples  present on admission. Asked PA to please consult vascular regarding when staples can be removed and whether our staff can remove them  7. AKI on CKD: fluids started     8. Critical limb ischemia: Continue DAPT indefinitely.  Follow-up with [facility] outpatient   9.  PAD: Continue aspirin . Statin being held d/t rhabdomyolysis.    10.  Hypertension: norvasc  2.5mg  daily started, Coreg  6.25 mg twice daily. Monitor BP per protocol.   11.   Chronic hx of Adrenalectomy 2018: On home Cortef  10 mg daily and 5 mg nightly.    12.  Hypothyroidism: Conitnue Synthroid  75 mcg.    13.  Severe vitamin D  deficiency, level was 8 on 12/15, start D3 daily   14.  T2DM: A1c 10.3-monitor CBG AC/HS with SSI NovoLog , increase lantus  to 10 U BID   15. Constipation:              -Decrease miralax  to daily, continue colace daily. Simethicone  prn. LBM 12/18, d/c prn milk of mag given kidney function, sorbitol  ordered 12/20- messaged nursing to see if she had results with this  16. Dysuria: UA not suggestive of bacterial infection, diflucan  started as above, Bactrim  d/ced  LOS: 7 days A FACE TO FACE EVALUATION  WAS PERFORMED  Sven SQUIBB Mechelle Pates 04/13/2024, 10:36 AM     "

## 2024-04-13 NOTE — Progress Notes (Signed)
" °  Progress Note    04/13/2024 8:16 AM   Subjective:  says her left groin is tender; says she has neuropathy pain in her feet but not having anything other than that, which is her normal.   afebrile  Vitals:   04/12/24 1926 04/13/24 0600  BP: (!) 131/56 (!) 149/74  Pulse: 75 68  Resp: 19 17  Temp: 97.8 F (36.6 C) 98.5 F (36.9 C)  SpO2: 99% 99%    Physical Exam: General:  no distress Lungs:  non labored Incisions:  RLE incisions clean with staples in tact; left groin with erythema and malodor with some tenderness.  Extremities:  bilateral feet warm   CBC    Component Value Date/Time   WBC 13.0 (H) 04/13/2024 0417   RBC 2.94 (L) 04/13/2024 0417   HGB 9.1 (L) 04/13/2024 0417   HGB 12.6 12/03/2023 1359   HGB 11.2 (L) 04/04/2017 0830   HCT 28.7 (L) 04/13/2024 0417   HCT 35.2 04/04/2017 0830   PLT 359 04/13/2024 0417   PLT 233 12/03/2023 1359   PLT 250 04/04/2017 0830   MCV 97.6 04/13/2024 0417   MCV 107.6 (H) 04/04/2017 0830   MCH 31.0 04/13/2024 0417   MCHC 31.7 04/13/2024 0417   RDW 15.5 04/13/2024 0417   RDW 14.0 04/04/2017 0830   LYMPHSABS 1.8 04/08/2024 1250   LYMPHSABS 1.6 04/04/2017 0830   MONOABS 1.1 (H) 04/08/2024 1250   MONOABS 0.4 04/04/2017 0830   EOSABS 0.2 04/08/2024 1250   EOSABS 0.1 04/04/2017 0830   BASOSABS 0.1 04/08/2024 1250   BASOSABS 0.0 04/04/2017 0830    BMET    Component Value Date/Time   NA 134 (L) 04/13/2024 0417   NA 140 04/04/2017 0830   K 5.5 (H) 04/13/2024 0417   K 4.1 04/04/2017 0830   CL 104 04/13/2024 0417   CO2 21 (L) 04/13/2024 0417   CO2 21 (L) 04/04/2017 0830   GLUCOSE 114 (H) 04/13/2024 0417   GLUCOSE 107 04/04/2017 0830   BUN 40 (H) 04/13/2024 0417   BUN 37.0 (H) 04/04/2017 0830   CREATININE 2.11 (H) 04/13/2024 0417   CREATININE 1.67 (H) 12/03/2023 1359   CREATININE 2.2 (H) 04/04/2017 0830   CALCIUM  8.7 (L) 04/13/2024 0417   CALCIUM  10.1 04/04/2017 0830   GFRNONAA 23 (L) 04/13/2024 0417   GFRNONAA 31  (L) 12/03/2023 1359   GFRAA 30 (L) 01/19/2020 0835    INR    Component Value Date/Time   INR 1.1 09/01/2018 1850     Intake/Output Summary (Last 24 hours) at 04/13/2024 0816 Last data filed at 04/12/2024 1836 Gross per 24 hour  Intake 460 ml  Output --  Net 460 ml      Assessment/Plan:  83 y.o. female is s/p:  Angiogram with right peroneal, TPT, popliteal artery angioplasty, thrombolysis 04/02/2024, angiogram with right peroneal artery and TPT angioplasty, stent right SFA 04/03/2024, left iliofemoral endarterectomy with bovine patch angioplasty, 4 compartment fasciotomy RLE 04/03/2024.    -right lower leg incisions clean with staples in tact -left groin tender and erythematous and malodorous.  Appears to have yeast.  I have started diflucan  150mg  every day x 3 days.  Continue to keep dry dressing in crease to wick moisture.   -ok to cleanse with soap and water  and pat completely dry then apply dry gauze.    Lucie Apt, PA-C [facility] [phone] 04/13/2024 8:16 AM    "

## 2024-04-13 NOTE — Progress Notes (Signed)
 Occupational Therapy Session Note  Patient Details  Name: Audrey [name] MRN: [id number] Date of Birth: 11-11-1940  Today's Date: 04/13/2024 OT Individual Time: 9194-9169 & 1007-1045 & 1435-1530 OT Individual Time Calculation (min): 25 min & 38 min & 55 min   Short Term Goals: Week 1:  OT Short Term Goal 1 (Week 1): Pt will complete bathing at SBA with AE as necessary OT Short Term Goal 2 (Week 1): Pt will complete tub/shower with LRAD at CGA OT Short Term Goal 3 (Week 1): Pt will complete LB dressing with CGA with AE as necessary  Skilled Therapeutic Interventions/Progress Updates:  Session 1 Skilled OT intervention completed with focus on functional endurance. Pt received upright in bed, agreeable to session. 5/10 pain reported in RLE and L groin; declined meds but discussed that pre-medicating prior to activities that typically increase pain may be ideal to promote participation and pt receptive to this. OT offered rest breaks for pain reduction.  Wound care present to assess pt's groin. Transitioned EOB mod I. Completed all sit > stands and ambulatory transfers using RW with supervision except for CGA over bathroom threshold due to incline.   Ambulated > bathroom. Completed toileting steps supervision for continent urinary void. Oral care and facial hygiene seated at sink with mod I. Discussed moving up pt's DC due to progress but family ed needed with daughter for pt's ability to do stairs. CSW notified.  Pt remained seated in w/c, with chair alarm on/activated, and with all needs in reach at end of session.  Session 2 Skilled OT intervention completed with focus on functional endurance, nausea management. Pt received seated in w/c, agreeable to session. RLE pain reported, declined intervention. OT offered rest breaks.  RLE observed to have increased swelling from this morning despite elevation with leg rests in w/c. Discussed trying to elevate in bed or recliner for edema management  during day.   Completed supervision sit > stand and ambulatory transfers using RW during session. Ambulated > bathroom. Continent of urinary void. Managed all steps supervision. Pt donned both wound shoes with set up A. Did endorse nausea after movement- retrieved snack and beverage for management. Nurse notified. Transferred to bed for elevation without assist. Pt remained semi upright in bed with direct care handoff to nursing at end of session.  Session 3 Skilled OT intervention completed with focus on dynamic standing balance, endurance. Pt received seated in w/c, agreeable to session. 5/10 pain reported in RLE; pre-medicated. OT offered rest breaks for pain reduction.  Pt reported fatigue but agreeable to go to gym for fun activity. Transported dependently in w/c <> gym. Completed all ambulatory transfers using RW with supervision. Pt participated in the following dynamic standing balance and endurance tasks to promote independence and safety during BADLs and functional mobility: -cornhole toss activity in standing without LOB. Pt then retrieved bean bags at ambulatory level using reacher with education provided about reacher use for picking up dropped items independently  Pt remained seated in w/c, with chair alarm on/activated, and with all needs in reach at end of session.    Therapy Documentation Precautions:  Precautions Precautions: Fall Recall of Precautions/Restrictions: Intact Restrictions Weight Bearing Restrictions Per Provider Order: No RLE Weight Bearing Per Provider Order: Weight bearing as tolerated    Therapy/Group: Individual Therapy  [name], MS, OTR/L  04/13/2024, 3:48 PM

## 2024-04-13 NOTE — Progress Notes (Signed)
 Physical Therapy Session Note  Patient Details  Name: Audrey [name] MRN: [id number] Date of Birth: January 18, 1941  Today's Date: 04/13/2024 PT Individual Time: 1300-1425 PT Individual Time Calculation (min): 85 min   Short Term Goals: Week 1:  PT Short Term Goal 1 (Week 1): pt will perform bed<>chair transfers with supervision PT Short Term Goal 2 (Week 1): pt will ambulate 25ft with LRAD and min A PT Short Term Goal 3 (Week 1): pt will transfer sit<>stand with LRAD and CGA  Skilled Therapeutic Interventions/Progress Updates:     Pt semi-reclined in bed upon arrival. Pt reports 6/10 pain in L groin - requested pain meds from nursing and administered during session. Agreeable to therapy. Session emphasized functional strengthening, endurance/activity tolerance, and dynamic balance with ambulation and transfers. Pt sat to EOB with CGA and performed short distance ambulatory transfer to [facility] over toilet using RW with CGA and assist for IV pole. No assist required for peri hygiene. Pt required CGA in standing while performing clothing management. Pt donned shoes with SU assist while seated in WC. Pt transported dependent in [facility] to main gym for time/energy management. Pt amb 60 ft, 60 ft, and 75 ft using RW with CGA. Pt requested seated rest breaks due to pain. Pt performed 3x10 LAQ, 3 sec holds, B LE, 2# ankle weight on L and 1# ankle weight on R foot. Pt amb another 40 ft mat <> staircase using RW with CGA. Pt practiced performing step ups to 3 inch step with B UE initially (CGA), progressing to single HR (min A initially progressing to CGA). Pt too anxious to attempt step ups without HR despite having 1 STE without HR at home. Once back in room, pt able to position her WC in room with CGA to avoid hitting L UE on sink. All needs in reach at end of session.  Therapy Documentation Precautions:  Precautions Precautions: Fall Recall of Precautions/Restrictions: Intact Restrictions Weight Bearing  Restrictions Per Provider Order: No RLE Weight Bearing Per Provider Order: Weight bearing as tolerated  Therapy/Group: Individual Therapy  Audrey [name], PT, DPT 04/13/2024, 7:55 AM

## 2024-04-14 LAB — CBC WITH DIFFERENTIAL/PLATELET
Abs Immature Granulocytes: 0.33 K/uL — ABNORMAL HIGH (ref 0.00–0.07)
Basophils Absolute: 0.1 K/uL (ref 0.0–0.1)
Basophils Relative: 1 %
Eosinophils Absolute: 0.2 K/uL (ref 0.0–0.5)
Eosinophils Relative: 2 %
HCT: 27.1 % — ABNORMAL LOW (ref 36.0–46.0)
Hemoglobin: 8.5 g/dL — ABNORMAL LOW (ref 12.0–15.0)
Immature Granulocytes: 3 %
Lymphocytes Relative: 12 %
Lymphs Abs: 1.3 K/uL (ref 0.7–4.0)
MCH: 30.7 pg (ref 26.0–34.0)
MCHC: 31.4 g/dL (ref 30.0–36.0)
MCV: 97.8 fL (ref 80.0–100.0)
Monocytes Absolute: 1.2 K/uL — ABNORMAL HIGH (ref 0.1–1.0)
Monocytes Relative: 10 %
Neutro Abs: 8.1 K/uL — ABNORMAL HIGH (ref 1.7–7.7)
Neutrophils Relative %: 72 %
Platelets: 357 K/uL (ref 150–400)
RBC: 2.77 MIL/uL — ABNORMAL LOW (ref 3.87–5.11)
RDW: 15.4 % (ref 11.5–15.5)
WBC: 11.2 K/uL — ABNORMAL HIGH (ref 4.0–10.5)
nRBC: 0 % (ref 0.0–0.2)

## 2024-04-14 LAB — BASIC METABOLIC PANEL WITH GFR
Anion gap: 9 (ref 5–15)
BUN: 41 mg/dL — ABNORMAL HIGH (ref 8–23)
CO2: 20 mmol/L — ABNORMAL LOW (ref 22–32)
Calcium: 8.9 mg/dL (ref 8.9–10.3)
Chloride: 102 mmol/L (ref 98–111)
Creatinine, Ser: 2.17 mg/dL — ABNORMAL HIGH (ref 0.44–1.00)
GFR, Estimated: 22 mL/min — ABNORMAL LOW
Glucose, Bld: 209 mg/dL — ABNORMAL HIGH (ref 70–99)
Potassium: 5.4 mmol/L — ABNORMAL HIGH (ref 3.5–5.1)
Sodium: 131 mmol/L — ABNORMAL LOW (ref 135–145)

## 2024-04-14 LAB — GLUCOSE, CAPILLARY
Glucose-Capillary: 172 mg/dL — ABNORMAL HIGH (ref 70–99)
Glucose-Capillary: 250 mg/dL — ABNORMAL HIGH (ref 70–99)
Glucose-Capillary: 213 mg/dL — ABNORMAL HIGH (ref 70–99)
Glucose-Capillary: 187 mg/dL — ABNORMAL HIGH (ref 70–99)

## 2024-04-14 MED ORDER — INSULIN GLARGINE 100 UNIT/ML ~~LOC~~ SOLN
12.0000 [IU] | Freq: Two times a day (BID) | SUBCUTANEOUS | Status: DC
  Administered 2024-04-14 – 2024-04-15 (×2): 12 [IU] via SUBCUTANEOUS
  Filled 2024-04-14 (×3): qty 0.12

## 2024-04-14 MED ORDER — CARVEDILOL 3.125 MG PO TABS
3.1250 mg | ORAL_TABLET | Freq: Two times a day (BID) | ORAL | Status: DC
  Administered 2024-04-14 – 2024-04-16 (×4): 3.125 mg via ORAL
  Filled 2024-04-14 (×4): qty 1

## 2024-04-14 MED ORDER — VITAMIN D 25 MCG (1000 UNIT) PO TABS
3000.0000 [IU] | ORAL_TABLET | Freq: Every day | ORAL | Status: DC
  Administered 2024-04-15 – 2024-04-17 (×3): 3000 [IU] via ORAL
  Filled 2024-04-14 (×4): qty 3

## 2024-04-14 MED ORDER — LINEZOLID 600 MG PO TABS
600.0000 mg | ORAL_TABLET | Freq: Two times a day (BID) | ORAL | Status: DC
  Administered 2024-04-14 – 2024-04-17 (×7): 600 mg via ORAL
  Filled 2024-04-14 (×7): qty 1

## 2024-04-14 NOTE — Progress Notes (Signed)
 Pharmacy Antibiotic Note  Audrey [name] is a 83 y.o. female admitted on 04/06/2024 with Functional deficits secondary to Debility due to RLE ischemia s/p fasciotomy x4 with wounds on RLE. Pharmacy has been consulted for antibiotic recommendtaion for cellulits.  Patient with recent allergy documentation change to anaphylaxis to Ceftiaxone. Previously looks as if tolerated ceftriaxone  and cefazolin . Given recent change, would hesitate to start any cephalosporin. Patient also has documented intollerance to doxycycline (N/V). MD discussed rechallenge of doxy with patient and patient does not wish to rechallenge. Would also hesitate to start PCN given Ceph allergy and patient recently on bactrim . MRSA PCR screen was negative for MRSA, but will start linezolid  PO at this point.   Plan: Linezolid  600mg  PO BID  Height: 4' 10 (147.3 cm) Weight: 56.5 kg (124 lb 9 oz) IBW/kg (Calculated) : 40.9  Temp (24hrs), Avg:97.9 F (36.6 C), Min:97.5 F (36.4 C), Max:98.1 F (36.7 C)  Recent Labs  Lab 04/08/24 1250 04/13/24 0417 04/14/24 0110  WBC 12.4* 13.0* 11.2*  CREATININE  --  2.11*  --     Estimated Creatinine Clearance: 15.3 mL/min (A) (by C-G formula based on SCr of 2.11 mg/dL (H)).    Allergies[1]  Antimicrobials this admission: Bacctyrim 12/21 >> 12/23 Fluconazole  12/23 >> 12/25 Linezolid  12/24 >>  Dose adjustments this admission: N/a  Microbiology results: N/a  12/12 MRSA PCR: Negative  Corney Knighton A. Lyle, PharmD, BCPS, FNKF Clinical Pharmacist [place] Please utilize Amion for appropriate phone number to reach the unit pharmacist [facility])  04/14/2024 9:15 AM     [1]  Allergies Allergen Reactions   Ceftriaxone  Anaphylaxis   Vibramycin [Doxycycline] Nausea And Vomiting   Adhesive [Tape] Other (See Comments)    Very thin skin, tears easily  OK to use paper tape   Cephalosporins Dermatitis    Tolerated cefazolin  and ceftriaxone     Cymbalta [Duloxetine Hcl] Other  (See Comments)    Dizziness  Woozy   Nsaids Other (See Comments)    Told to avoid, patient has 1 kidney   Statins Other (See Comments)    Liver/kidney issues.

## 2024-04-14 NOTE — Progress Notes (Signed)
 Physical Therapy Session Note  Patient Details  Name: Audrey [name] MRN: [id number] Date of Birth: Feb 18, 1941  Today's Date: 04/14/2024 PT Individual Time: 1100-1200 PT Individual Time Calculation (min): 60 min   Short Term Goals: Week 1:  PT Short Term Goal 1 (Week 1): pt will perform bed<>chair transfers with supervision PT Short Term Goal 2 (Week 1): pt will ambulate 61ft with LRAD and min A PT Short Term Goal 3 (Week 1): pt will transfer sit<>stand with LRAD and CGA  Skilled Therapeutic Interventions/Progress Updates: Pt presented in w/c agreeable to therapy. Pt c/o mild pain in groin and RLE, unrated with rest and repositioning providing during session. Pt indicated concern regarding one of her incisions and thinks infection. Per MD note aware of pt's concern but also noted that WBC trending downward, indicated this to pt who verbalized understanding. Discussed with pt any concerns regarding d/c with pt feeling confident on mobility in home, but less confidence on uneven surfaces. Pt agreeable to work on that during session. Pt requesting to use bathroom. Completed ambulatory transfer to bathroom mod I and toilet transfers with distant supervision. Mod I for all toileting tasks with continent urinary void. Pt then ambulated back to w/c after completing hand hygiene mod I standing at sink. Pt transported to main gym for energy conservation. Participated in gait on mat, then with washcloths placed under mat both initially CGA fading to supervision with practice. Pt then ambulated to 4W nsg station with RW mod I and transported back to room remaining distance. Pt remained in w/c at end og session with call bell within reach and needs met.      Therapy Documentation Precautions:  Precautions Precautions: Fall Recall of Precautions/Restrictions: Intact Restrictions Weight Bearing Restrictions Per Provider Order: No RLE Weight Bearing Per Provider Order: Weight bearing as tolerated General:       Therapy/Group: Individual Therapy  Shalini Mair 04/14/2024, 12:22 PM

## 2024-04-14 NOTE — Progress Notes (Signed)
 Occupational Therapy Session Note  Patient Details  Name: SHAKERIA ROBINETTE MRN: [id number] Date of Birth: 11-18-1940  Today's Date: 04/14/2024 OT Individual Time: 9164-9069 OT Individual Time Calculation (min): 55 min    Short Term Goals: Week 2:  OT Short Term Goal 1 (Week 2): STG = LTG due to ELOS  Skilled Therapeutic Interventions/Progress Updates:  Skilled OT intervention completed with focus on ADL retraining, functional mobility BLE ROM. Pt received seated in w/c, agreeable to session. Nurse present to administer meds. 6/10 pain reported in RLE; nurse notified of pain med request. OT offered rest breaks and repositioning throughout for pain reduction.  Pt requested to get ready for day. Completed all sit > stands and ambulatory transfers using RW with supervision during session. Ambulated > toilet with assist only for IV pole. Continent of urinary void. Able to doff all LB and change to new ones including pad, groin guaze dressing and socks/shoes. Discussed setting items up at toilet at home or lowering height of BSC to make the leaning more efficient/safer. Completed hand hygiene in stance with supervision.   Transported dependently <> gym. Transferred <> nustep. Pt completed the following to promote RLE ROM needed for independence with BADLs and functional mobility: -12 mins, level 1  Pt remained seated in w/c with BLE elevated, with chair alarm on/activated, and with all needs in reach at end of session.   Therapy Documentation Precautions:  Precautions Precautions: Fall Recall of Precautions/Restrictions: Intact Restrictions Weight Bearing Restrictions Per Provider Order: No RLE Weight Bearing Per Provider Order: Weight bearing as tolerated    Therapy/Group: Individual Therapy  [name], MS, OTR/L  04/14/2024, 9:40 AM

## 2024-04-14 NOTE — Progress Notes (Signed)
 "                                                        PROGRESS NOTE   Subjective/Complaints: No new complaints this morning WBC is trending downward but right lower extremity incision now appears erythematous, will start Linezolid   ROS: +fungal infection of toes, +diabetic peripheral neuropathy, +pain posterior to right knee, +dysuria- continues, +erythema of right lower extremity incision   Objective:   No results found.  Recent Labs    04/13/24 0417 04/14/24 0110  WBC 13.0* 11.2*  HGB 9.1* 8.5*  HCT 28.7* 27.1*  PLT 359 357   Recent Labs    04/13/24 0417  NA 134*  K 5.5*  CL 104  CO2 21*  GLUCOSE 114*  BUN 40*  CREATININE 2.11*  CALCIUM  8.7*     Intake/Output Summary (Last 24 hours) at 04/14/2024 0959 Last data filed at 04/14/2024 9171 Gross per 24 hour  Intake 1094 ml  Output --  Net 1094 ml     Wound 04/06/24 1500 Pressure Injury Heel Right Deep Tissue Pressure Injury - Purple or maroon localized area of discolored intact skin or blood-filled blister due to damage of underlying soft tissue from pressure and/or shear. (Active)     Wound 04/06/24 1500 Pressure Injury Heel Left;Lateral Deep Tissue Pressure Injury - Purple or maroon localized area of discolored intact skin or blood-filled blister due to damage of underlying soft tissue from pressure and/or shear. (Active)     Wound 04/06/24 1500 Pressure Injury Coccyx Deep Tissue Pressure Injury - Purple or maroon localized area of discolored intact skin or blood-filled blister due to damage of underlying soft tissue from pressure and/or shear. (Active)    Physical Exam: Vital Signs Blood pressure (!) 128/58, pulse (!) 57, temperature (!) 97.5 F (36.4 C), temperature source Oral, resp. rate 16, height 4' 10 (1.473 m), weight 56.5 kg, SpO2 100%.  Gen: no distress, normal appearing HEENT: oral mucosa pink and moist, NCAT Cardio: Bradycardia Chest: normal effort, normal rate of breathing Abd: soft,  non-distended Ext: no edema Psych: pleasant, normal affect Skin: fungal infection of toenails, right calf incision is erythematous, has associated edema, yeast infection of left groin incision Neuro: Alert and oriented x3, 4/5 strength in RLE proximally and 3/5 strength distally, strength is otherwise 5/5 with the exception of 0/5 big toe extension on left foot, decreased sensation in bilateral feet, stable 12/24 MSK: TTP posterior to right knee with mild edema   Assessment/Plan: 1. Functional deficits which require 3+ hours per day of interdisciplinary therapy in a comprehensive inpatient rehab setting. Physiatrist is providing close team supervision and 24 hour management of active medical problems listed below. Physiatrist and rehab team continue to assess barriers to discharge/monitor patient progress toward functional and medical goals  Care Tool:  Bathing    Body parts bathed by patient: Right arm, Left arm, Chest, Abdomen, Front perineal area, Buttocks, Right upper leg, Left upper leg, Face     Body parts n/a: Right lower leg, Left lower leg   Bathing assist Assist Level: Contact Guard/Touching assist     Upper Body Dressing/Undressing Upper body dressing   What is the patient wearing?: Pull over shirt    Upper body assist Assist Level: Set up assist    Lower Body Dressing/Undressing Lower body  dressing      What is the patient wearing?: Pants     Lower body assist Assist for lower body dressing: Supervision/Verbal cueing     Toileting Toileting    Toileting assist Assist for toileting: Supervision/Verbal cueing     Transfers Chair/bed transfer  Transfers assist     Chair/bed transfer assist level: Independent with assistive device Chair/bed transfer assistive device: Geologist, Engineering   Ambulation assist   Ambulation activity did not occur: Safety/medical concerns (pain, weakness, decreased balance)  Assist level: Independent with  assistive device Assistive device: Walker-rolling Max distance: 116ft   Walk 10 feet activity   Assist  Walk 10 feet activity did not occur: Safety/medical concerns (pain, weakness, decreased balance)  Assist level: Independent with assistive device Assistive device: Walker-rolling   Walk 50 feet activity   Assist Walk 50 feet with 2 turns activity did not occur: Safety/medical concerns (pain, weakness, decreased balance)  Assist level: Independent with assistive device Assistive device: Walker-rolling    Walk 150 feet activity   Assist Walk 150 feet activity did not occur: Safety/medical concerns (pain, weakness, decreased balance)  Assist level: Independent with assistive device Assistive device: Walker-rolling    Walk 10 feet on uneven surface  activity   Assist Walk 10 feet on uneven surfaces activity did not occur: Safety/medical concerns (pain, weakness, decreased balance)   Assist level: Supervision/Verbal cueing Assistive device: Walker-rolling   Wheelchair     Assist Is the patient using a wheelchair?: Yes Type of Wheelchair: Manual Wheelchair activity did not occur: Safety/medical concerns (pain, weakness)  Wheelchair assist level: Independent Max wheelchair distance: 113ft    Wheelchair 50 feet with 2 turns activity    Assist    Wheelchair 50 feet with 2 turns activity did not occur: Safety/medical concerns (pain, weakness)   Assist Level: Independent   Wheelchair 150 feet activity     Assist  Wheelchair 150 feet activity did not occur: Safety/medical concerns (pain, weakness)   Assist Level: Independent   Blood pressure (!) 128/58, pulse (!) 57, temperature (!) 97.5 F (36.4 C), temperature source Oral, resp. rate 16, height 4' 10 (1.473 m), weight 56.5 kg, SpO2 100%.   Medical Problem List and Plan: 1. Functional deficits secondary to Debility due to RLE ischemia s/p fasciotomy x4 with wounds on RLE             -patient  may  shower if covers incisions             -ELOS/Goals: ~ 10-14 days- supervision to intermittent mod I             Chart and therapy notes reviewed, continue CIR, discussed patient's progress with therapy Grounds pass ordered Vitamin D3/Metanx/Vitamin B/C complex ordered F/u with me or Fidela in clinic within 1 month Would benefit from home health aide upon discharge   -Heparin  decreased to q12H 2/2 anemia, Hgb has now improved to 10.3, continue this dose             -antiplatelet therapy: Aspirin  and Plavix    This patient is capable of making decisions on her own behalf.   2. Pain:  A) Diabetic peripheral neuropathy, chronic: As needed Tylenol , Voltaren , and oxycodone . 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 Discussed with patient getting MRI but she defers   3. Fungal infection of toes: antifungal cream added  5. Yeast infection of left groin incision: diflucan  started   6. RLE fasciotomy:  -Daily dressing changes to fasciotomies and L groin. Staples present on admission. Asked PA to please consult vascular regarding when staples can be removed and whether our staff can remove them Linezolid  started for cellulitis  7. AKI on CKD: fluids started, add on BMP today   8. Critical limb ischemia: Continue DAPT indefinitely.  Follow-up with [facility] outpatient   9.  PAD: Continue aspirin . Statin being held d/t rhabdomyolysis.    10.  Hypotension: d/c norvasc , Coreg  6.25 mg twice daily. Monitor BP per protocol.   11.   Chronic hx of Adrenalectomy 2018: On home Cortef  10 mg daily and 5 mg nightly.    12.  Hypothyroidism: Conitnue Synthroid  75 mcg.    13.  Severe vitamin D  deficiency, level was 8 on 12/15, increase D3 to 3,000U daily   14.  T2DM: A1c 10.3-monitor CBG AC/HS with SSI NovoLog , increase lantus  to 11 U BID   15. Constipation:               -Decrease miralax  to daily, continue colace daily. Simethicone  prn. LBM 12/18, d/c prn milk of mag given kidney function, sorbitol  ordered 12/20- messaged nursing to see if she had results with this  16. Dysuria: UA not suggestive of bacterial infection, diflucan  started as above, Bactrim  d/ced  LOS: 8 days A FACE TO FACE EVALUATION WAS PERFORMED  Sven P Renard Caperton 04/14/2024, 9:59 AM     "

## 2024-04-14 NOTE — Progress Notes (Addendum)
 Patient ID: Audrey [name], female   DOB: 1940-09-04, 83 y.o.   MRN: [id number]  Have reviewed team conference with pt and family. Both aware and agreeable with targeted d/c date of 12/27 and goals of Independent with assistive device.  Therapy recommending HH PT/OT - will be established with [facility] in [place].   Home Health:   PT     OT                                   Agency: [facility]     Phone: [phone]   Medical Equipment/Items Ordered: Youth walker                                                             Agency/Supplier: [employer]

## 2024-04-14 NOTE — Progress Notes (Signed)
 Physical Therapy Discharge Summary  Patient Details  Name: Audrey [name] MRN: [id number] Date of Birth: [birth year]  Date of Discharge from PT service:April 16, 2024  Patient has met 10 of 10 long term goals due to improved activity tolerance, improved balance, improved postural control, increased strength, increased range of motion, decreased pain, ability to compensate for deficits, improved awareness, and improved coordination.  Patient to discharge at a wheelchair level Modified Independent. Patient's care partner is independent to provide the necessary physical assistance at discharge.  All goals met  Recommendation:  Patient will benefit from ongoing skilled PT services in home health setting to continue to advance safe functional mobility, address ongoing impairments in transfers, generalized strengthening and endurance, dynamic standing balance/coordination, ambulation, ROM, and to minimize fall risk.  Equipment: 16x16 manual WC with bilateral elevating legrests, youth RW  Reasons for discharge: treatment goals met  Patient/family agrees with progress made and goals achieved: Yes  PT Discharge Precautions/Restrictions Precautions Precautions: Fall Recall of Precautions/Restrictions: Intact Restrictions Weight Bearing Restrictions Per Provider Order: No RLE Weight Bearing Per Provider Order: Weight bearing as tolerated Pain Interference Pain Interference Pain Effect on Sleep: 2. Occasionally Pain Interference with Therapy Activities: 1. Rarely or not at all Pain Interference with Day-to-Day Activities: 1. Rarely or not at all Cognition Overall Cognitive Status: Within Functional Limits for tasks assessed Arousal/Alertness: Awake/alert Orientation Level: Oriented X4 Memory: Appears intact Awareness: Appears intact Problem Solving: Appears intact Safety/Judgment: Appears intact Sensation Sensation Light Touch: Impaired by gross assessment Hot/Cold: Not  tested Proprioception: Appears Intact Stereognosis: Not tested Additional Comments: 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. Absent sensation along R great toe Coordination Gross Motor Movements are Fluid and Coordinated: Yes Heel Shin Test: not tested Motor  Motor Motor: Within Functional Limits Motor - Skilled Clinical Observations: pain in RLE  Mobility Bed Mobility Bed Mobility: Rolling Right;Rolling Left;Sit to Supine;Supine to Sit Rolling Right: Independent with assistive device Rolling Left: Independent with assistive device Supine to Sit: Independent with assistive device Sit to Supine: Independent with assistive device Transfers Transfers: Sit to Stand;Stand to Sit;Stand Pivot Transfers Sit to Stand: Independent with assistive device Stand to Sit: Independent with assistive device Stand Pivot Transfers: Independent with assistive device Transfer (Assistive device): Rolling walker (youth) Locomotion  Gait Ambulation: Yes Gait Assistance: Independent with assistive device Gait Distance (Feet): 155 Feet Assistive device: Rolling walker (youth) Gait Gait: Yes Gait Pattern: Impaired Gait Pattern: Step-to pattern;Decreased step length - right;Decreased step length - left;Decreased stride length;Decreased stance time - right;Poor foot clearance - right;Poor foot clearance - left;Narrow base of support;Antalgic Gait velocity: reduced Stairs / Additional Locomotion Stairs: Yes Stairs Assistance: Supervision/Verbal cueing Stair Management Technique: Forwards;With walker Number of Stairs: 1 Height of Stairs: 4 Ramp: Supervision/Verbal cueing (youth RW) Curb: Supervision/Verbal cueing (youth RW) Pick up small object from the floor assist level: Independent with assistive device Pick up small object from the floor assistive device: reacher and Psychologist, Educational: Yes Wheelchair Assistance: Independent with Scientist, Research (life Sciences):  Both upper extremities Wheelchair Parts Management: Needs assistance Distance: 132ft  Trunk/Postural Assessment  Cervical Assessment Cervical Assessment: Exceptions to [facility] (forward head) Thoracic Assessment Thoracic Assessment: Exceptions to [facility] (mild kyphosis) Lumbar Assessment Lumbar Assessment: Exceptions to [facility] (posterior pelvic tilt) Postural Control Postural Control: Within Functional Limits  Balance Balance Balance Assessed: Yes Static Sitting Balance Static Sitting - Balance Support: Feet supported;No upper extremity supported Static Sitting - Level of Assistance: 7: Independent Dynamic Sitting Balance Dynamic Sitting -  Balance Support: Feet supported;No upper extremity supported Dynamic Sitting - Level of Assistance: 6: Modified independent (Device/Increase time) Static Standing Balance Static Standing - Balance Support: Bilateral upper extremity supported;During functional activity (RW) Static Standing - Level of Assistance: 6: Modified independent (Device/Increase time) Dynamic Standing Balance Dynamic Standing - Balance Support: Bilateral upper extremity supported;During functional activity (RW) Dynamic Standing - Level of Assistance: 6: Modified independent (Device/Increase time) Dynamic Standing - Comments: with transfers and ambulation Extremity Assessment  RLE Assessment RLE Assessment: Exceptions to [facility] General Strength Comments: tested sitting in WC RLE Strength Right Hip Flexion: 4-/5 Right Hip ABduction: 4-/5 Right Hip ADduction: 4-/5 Right Knee Flexion: 3+/5 Right Knee Extension: 4-/5 Right Ankle Dorsiflexion: 3+/5 Right Ankle Plantar Flexion: 3+/5 LLE Assessment LLE Assessment: Exceptions to [facility] General Strength Comments: tested sitting in WC LLE Strength Left Hip Flexion: 4/5 Left Hip ABduction: 4/5 Left Hip ADduction: 4/5 Left Knee Flexion: 4-/5 Left Knee Extension: 4-/5 Left Ankle Dorsiflexion: 4/5 Left Ankle Plantar Flexion: 4/5   Therisa HERO  Zaunegger Therisa Stains PT, DPT 04/14/2024, 7:01 AM

## 2024-04-14 NOTE — Discharge Instructions (Addendum)
 Inpatient Rehab Discharge Instructions  Ainhoa Rallo Fehr  Discharge date and time: 04/17/2024  Activities/Precautions/ Functional Status:  Activity: no lifting, driving, or strenuous exercise for until cleared by provider.  Diet: diabetic diet  Wound Care: Clean left groin with soap and water  and pat dry completely and keep dry gauze in crease to wick moisture.    Functional status:  ___ No restrictions     ___ Walk up steps independently ___ 24/7 supervision/assistance   ___ Walk up steps with assistance _x__ Intermittent supervision/assistance  ___ Bathe/dress independently __x_ Walk with walker    ___ Bathe/dress with assistance ___ Walk Independently    ___ Shower independently ___ Walk with assistance    ___ Shower with assistance __X_ No alcohol      ___ Return to work/school ________  Special Instructions:  Finish antibiotic course, it will end on 04/19/2024.   Diabetic supplies have been sent to the [facility] in [place], [place].   COMMUNITY REFERRALS UPON DISCHARGE:    Home Health:   PT     OT                        Agency: [facility] Phone: [phone]  *Please expect follow-up within 2-3 business days for discharge to schedule your home visit. If you have not received follow-up, be sure to contact the site directly.*   Medical Equipment/Items Ordered: Youth walker                                                  Agency/Supplier: [employer]   My questions have been answered and I understand these instructions. I will adhere to these goals and the provided educational materials after my discharge from the hospital.  Patient/Caregiver Signature _______________________________ Date __________  Clinician Signature _______________________________________ Date __________  Please bring this form and your medication list with you to all your follow-up doctor's appointments.

## 2024-04-14 NOTE — Progress Notes (Signed)
 Occupational Therapy Session Note  Patient Details  Name: Audrey [name] MRN: [id number] Date of Birth: [birth year]  Today's Date: 04/14/2024 OT Individual Time: 8696-8653 OT Individual Time Calculation (min): 43 min    Short Term Goals: Week 2:  OT Short Term Goal 1 (Week 2): STG = LTG due to ELOS  Skilled Therapeutic Interventions/Progress Updates:    Pt received sitting up, agreeable to session. She reports pain is difficult to report d/t multiple incisions and baseline neuropathy in feet that has her in pain all the time. Notified RN of request for pain medication. She stood from w/c with (S) using the RW. Ambulatory transfer into the bathroom with close (S), CGA over threshold only. She completed 3/3 toileting tasks with (S). Assisted her with placing new gauze pads under abdomen and along groin incision. Pt was taken via w/c to the therapy gym for time management. She completed standing level functional reaching activity with her RUE to address functional activity tolerance, standing tolerance, and LE weightbearing. 2 repetitions completed. She was then taken outside via w/c to benefit from fresh air and sunlight for increased psychosocial adjustment to situation. She was taken inside and to the room. She completed 10 ft of functional mobility to the bathroom with the cane with min A, very poor postural control as she reached out for anything in the environment, she admits furniture walking is her baseline. Provided education on fall risk and need to use AE. She was left sitting on the toilet, agreeable to call out for staff assist when finished.    Therapy Documentation Precautions:  Precautions Precautions: Fall Recall of Precautions/Restrictions: Intact Restrictions Weight Bearing Restrictions Per Provider Order: No RLE Weight Bearing Per Provider Order: Weight bearing as tolerated  Therapy/Group: Individual Therapy  Nena VEAR Moats 04/14/2024, 7:30 AM

## 2024-04-14 NOTE — Patient Care Conference (Signed)
 Inpatient RehabilitationTeam Conference and Plan of Care Update Date: 04/14/2024   Time: 11:20 AM    Patient Name: Audrey [name]      Medical Record Number: [id number]  Date of Birth: September 29, 1940 Sex: Female         Room/Bed: 4W05C/4W05C-01 Payor Info: Payor: MEDICARE / Plan: MEDICARE PART A AND B / Product Type: *No Product type* /    Admit Date/Time:  04/06/2024  2:36 PM  Primary Diagnosis:  Debility  Hospital Problems: Principal Problem:   Debility Active Problems:   H/O fasciotomy   Critical limb ischemia of right lower extremity with ulceration of foot (HCC)   Coping style affecting medical condition    Expected Discharge Date: Expected Discharge Date: 04/17/24  Team Members Present: Physician leading conference: Dr. Sven Elks Social Worker Present: Waverly Gentry, LCSW-A Nurse Present: Barnie Ronde, RN PT Present: Therisa Stains, PT OT Present: Monica Peacock, OT     Current Status/Progress Goal Weekly Team Focus  Bowel/Bladder   Pt is continent of B&B   Pt will remain continent of B&B   pt will be provided assistance with elimination needs and remain continent of B&B    Swallow/Nutrition/ Hydration               ADL's   Supervision/CGA all ADLs, occassional min A for LB depending on status of wounds   Mod I   Wound care, LB care, dynamic balance and functional endurance    Mobility   bed mobility supervision, transfers with RW CGA/close supervision, gait 123ft with RW CGA/close supervision   Mod I  barriers: RLE pain, decreased balance/coordination, global weakness/deconditioning    Communication                Safety/Cognition/ Behavioral Observations               Pain   Pt c/o pain to bilateral lower extremities   Pt pain will be less than 3 on the pain scale   Pt will be assessed for pain Q shift and medicated per MD orders to ensure pain is less than 3 on the pain scale    Skin   Pt is noted to have alterations in skin integrity    Pt skin will remain clean, dry and free of infection.  Pt will be turned Q 2hrs for positioning, comfort and skin integrity      Discharge Planning:  Plans to discharge home alone with support from family and friends. Family education recommended - awaiting response from family. Will await further therapy follow-up recommendations.   Team Discussion: Patient admitted with debility/Rhabdo post right lower extremity ischemia/peroneal artery angioplasty/stent placement complicated by compartmental syndrome requiring fasciotomy. Progress limited by deconditioning and right lower extremity pain.   Patient on target to meet rehab goals: yes, currently needs supervision - CGA for ADLs and min assist for lower body care/wound care.  Needs supervision - mod I to ambulate up to 150' using a RW and manage 1 ste with rails.   *See Care Plan and progress notes for long and short-term goals.   Revisions to Treatment Plan:  Right LE fungal infection addressed Qutenza OP referral for neuropathy Guiac stool for occult blood IVF bolus and Lokelma  administered Heparin  stopped; changed to SCDs   Teaching Needs: Safety, insulin  administration, CBG monitoring, medications, dietary modification, skin care/wound care, transfers, toileting, etc.   Current Barriers to Discharge: Decreased caregiver support, Home enviroment access/layout, and Wound care  Possible Resolutions to Barriers:  Family education HH follow up services     Medical Summary Current Status: hypotension, anemia, RLE erythema around incision, type 2 diabetes with hyperglycemia, left groin yeast infection, bradycardia, diabetic peripheral neuroapthy, AKI on CKD  Barriers to Discharge: Medical stability  Barriers to Discharge Comments: hypotension, anemia, RLE erythema around incision, type 2 diabetes with hyperglycemia, left groin yeast infection, bradycardia, diabetic perpheral neuropathy, AKI on CKD Possible Resolutions to  [employer] Focus: d/c amlodipine , d/c heparin , SCDs ordered, linezolid  ordered, increase insulin , diflucan  started, decrease coreg , continue Metanx, scheduled Qutenza outpatient, repeat BMP today   Continued Need for Acute Rehabilitation Level of Care: The patient requires daily medical management by a physician with specialized training in physical medicine and rehabilitation for the following reasons: Direction of a multidisciplinary physical rehabilitation program to maximize functional independence : Yes Medical management of patient stability for increased activity during participation in an intensive rehabilitation regime.: Yes Analysis of laboratory values and/or radiology reports with any subsequent need for medication adjustment and/or medical intervention. : Yes   I attest that I was present, lead the team conference, and concur with the assessment and plan of the team.   Fredericka Sober B 04/14/2024, 12:38 PM

## 2024-04-14 NOTE — Progress Notes (Signed)
 Occupational Therapy Note  Patient Details  Name: MELODEE LUPE MRN: [id number] Date of Birth: [birth year]   Occupational Therapist participated in the interdisciplinary team conference, providing clinical information regarding the patient's current status, treatment goals, and weekly focus, including any barriers that need to be addressed. Please see the Inpatient Rehabilitation Team Conference and Plan of Care Update for further details.    Ugonna Keirsey M 04/14/2024, 11:31 AM

## 2024-04-14 NOTE — Progress Notes (Signed)
 Physical Therapy Session Note  Patient Details  Name: Audrey [name] MRN: [id number] Date of Birth: February 07, 1941  Today's Date: 04/14/2024 PT Individual Time: 0731-0811 PT Individual Time Calculation (min): 40 min   Short Term Goals: Week 1:  PT Short Term Goal 1 (Week 1): pt will perform bed<>chair transfers with supervision PT Short Term Goal 2 (Week 1): pt will ambulate 32ft with LRAD and min A PT Short Term Goal 3 (Week 1): pt will transfer sit<>stand with LRAD and CGA  Skilled Therapeutic Interventions/Progress Updates:   Received pt sitting in WC, pt agreeable to PT treatment, and reported pain 5/10 in RLE (repositioning done to manage pain). Session with emphasis on discharge planning, functional mobility/transfers, generalized strengthening and endurance, dynamic standing balance/coordination, ambulation, simulated car transfers, and stair navigation. Went through sensation, MMT, and pain interference questionnaire in preparation for discharge. Pt transported to/from room in [facility] dependently for time management purposes. Pt performed all transfers with RW and supervision/mod I throughout session. Pt navigated 1 4in step with RW and supervision to simulate home entry with cues for technique. Pt then performed simulated car transfer with RW and supervision and ambulated 67ft on uneven surface (ramp) with RW and supervision. Discussed ordering youth RW to use at home - CSW notified. Pt able to stand and pick up object from floor using reacher and RW mod I. Pt ambulated 192ft with RW and supervision/mod I back to room and concluded session with pt sitting in [facility] with all needs within reach. Elevated RLE for edema management.   Therapy Documentation Precautions:  Precautions Precautions: Fall Recall of Precautions/Restrictions: Intact Restrictions Weight Bearing Restrictions Per Provider Order: Yes RLE Weight Bearing Per Provider Order: Weight bearing as tolerated  Therapy/Group: Individual  Therapy Audrey [name] Therisa Stains PT, DPT 04/14/2024, 6:53 AM

## 2024-04-15 LAB — GLUCOSE, CAPILLARY
Glucose-Capillary: 165 mg/dL — ABNORMAL HIGH (ref 70–99)
Glucose-Capillary: 177 mg/dL — ABNORMAL HIGH (ref 70–99)
Glucose-Capillary: 204 mg/dL — ABNORMAL HIGH (ref 70–99)
Glucose-Capillary: 241 mg/dL — ABNORMAL HIGH (ref 70–99)

## 2024-04-15 MED ORDER — SODIUM CHLORIDE 0.9 % IV SOLN: INTRAVENOUS | Status: AC

## 2024-04-15 MED ORDER — INSULIN GLARGINE 100 UNIT/ML ~~LOC~~ SOLN
13.0000 [IU] | Freq: Two times a day (BID) | SUBCUTANEOUS | Status: DC
  Administered 2024-04-15: 13 [IU] via SUBCUTANEOUS
  Filled 2024-04-15 (×3): qty 0.13

## 2024-04-15 NOTE — Plan of Care (Signed)
°  Problem: Consults Goal: RH GENERAL PATIENT EDUCATION Description: See Patient Education module for education specifics. Outcome: Progressing Goal: Skin Care Protocol Initiated - if Braden Score 18 or less Description: If consults are not indicated, leave blank or document N/A Outcome: Progressing Goal: Nutrition Consult-if indicated Outcome: Progressing Goal: Diabetes Guidelines if Diabetic/Glucose > 140 Description: If diabetic or lab glucose is > 140 mg/dl - Initiate Diabetes/Hyperglycemia Guidelines & Document Interventions  Outcome: Progressing   Problem: RH BOWEL ELIMINATION Goal: RH STG MANAGE BOWEL WITH ASSISTANCE Description: STG Manage Bowel with Assistance. Outcome: Progressing Goal: RH STG MANAGE BOWEL W/MEDICATION W/ASSISTANCE Description: STG Manage Bowel with Medication with Assistance. Outcome: Progressing   Problem: RH BLADDER ELIMINATION Goal: RH STG MANAGE BLADDER WITH ASSISTANCE Description: STG Manage Bladder With Assistance Outcome: Progressing Goal: RH STG MANAGE BLADDER WITH MEDICATION WITH ASSISTANCE Description: STG Manage Bladder With Medication With Assistance. Outcome: Progressing Goal: RH STG MANAGE BLADDER WITH EQUIPMENT WITH ASSISTANCE Description: STG Manage Bladder With Equipment With Assistance Outcome: Progressing   Problem: RH SKIN INTEGRITY Goal: RH STG SKIN FREE OF INFECTION/BREAKDOWN Outcome: Progressing Goal: RH STG MAINTAIN SKIN INTEGRITY WITH ASSISTANCE Description: STG Maintain Skin Integrity With Assistance. Outcome: Progressing Goal: RH STG ABLE TO PERFORM INCISION/WOUND CARE W/ASSISTANCE Description: STG Able To Perform Incision/Wound Care With Assistance. Outcome: Progressing   Problem: RH SAFETY Goal: RH STG ADHERE TO SAFETY PRECAUTIONS W/ASSISTANCE/DEVICE Description: STG Adhere to Safety Precautions With Assistance/Device. Outcome: Progressing Goal: RH STG DECREASED RISK OF FALL WITH ASSISTANCE Description: STG  Decreased Risk of Fall With Assistance. Outcome: Progressing   Problem: RH PAIN MANAGEMENT Goal: RH STG PAIN MANAGED AT OR BELOW PT'S PAIN GOAL Outcome: Progressing   Problem: RH KNOWLEDGE DEFICIT GENERAL Goal: RH STG INCREASE KNOWLEDGE OF SELF CARE AFTER HOSPITALIZATION Outcome: Progressing   Problem: RH Vision Goal: RH LTG Vision (Specify) Outcome: Progressing   Problem: RH Pre-functional/Other (Specify) Goal: RH LTG Pre-functional (Specify) Outcome: Progressing Goal: RH LTG Interdisciplinary (Specify) 1 Description: RH LTG Interdisciplinary (Specify)1 Outcome: Progressing Goal: RH LTG Interdisciplinary (Specify) 2 Description: RH LTG Interdisciplinary (Specify) 2  Outcome: Progressing   

## 2024-04-15 NOTE — Progress Notes (Addendum)
 "                                                        PROGRESS NOTE   Subjective/Complaints: Concerned about erythema in her right big toe as well, concerned about infection, discussed that we started Linezolid  yesterday, discussed that she can continue this at home  ROS: +fungal infection of toes, +diabetic peripheral neuropathy, +pain posterior to right knee, +dysuria- continues, +erythema of right lower extremity incision   Objective:   No results found.  Recent Labs    04/13/24 0417 04/14/24 0110  WBC 13.0* 11.2*  HGB 9.1* 8.5*  HCT 28.7* 27.1*  PLT 359 357   Recent Labs    04/13/24 0417 04/14/24 1609  NA 134* 131*  K 5.5* 5.4*  CL 104 102  CO2 21* 20*  GLUCOSE 114* 209*  BUN 40* 41*  CREATININE 2.11* 2.17*  CALCIUM  8.7* 8.9     Intake/Output Summary (Last 24 hours) at 04/15/2024 9075 Last data filed at 04/14/2024 1747 Gross per 24 hour  Intake 620 ml  Output --  Net 620 ml     Wound 04/06/24 1500 Pressure Injury Heel Right Deep Tissue Pressure Injury - Purple or maroon localized area of discolored intact skin or blood-filled blister due to damage of underlying soft tissue from pressure and/or shear. (Active)     Wound 04/06/24 1500 Pressure Injury Heel Left;Lateral Deep Tissue Pressure Injury - Purple or maroon localized area of discolored intact skin or blood-filled blister due to damage of underlying soft tissue from pressure and/or shear. (Active)     Wound 04/06/24 1500 Pressure Injury Coccyx Deep Tissue Pressure Injury - Purple or maroon localized area of discolored intact skin or blood-filled blister due to damage of underlying soft tissue from pressure and/or shear. (Active)    Physical Exam: Vital Signs Blood pressure (!) 138/41, pulse (!) 55, temperature 97.6 F (36.4 C), resp. rate 19, height 4' 10 (1.473 m), weight 56.5 kg, SpO2 99%.  Gen: no distress, normal appearing HEENT: oral mucosa pink and moist, NCAT Cardio: Bradycardia Chest:  normal effort, normal rate of breathing Abd: soft, non-distended Ext: no edema Psych: pleasant, normal affect Skin: fungal infection of toenails, right calf incision is erythematous, has associated edema, yeast infection of left groin incision, erythema to right great toe Neuro: Alert and oriented x3, 4/5 strength in RLE proximally and 3/5 strength distally, strength is otherwise 5/5 with the exception of 0/5 big toe extension on left foot, decreased sensation in bilateral feet, stable 12/25 MSK: TTP posterior to right knee with mild edema   Assessment/Plan: 1. Functional deficits which require 3+ hours per day of interdisciplinary therapy in a comprehensive inpatient rehab setting. Physiatrist is providing close team supervision and 24 hour management of active medical problems listed below. Physiatrist and rehab team continue to assess barriers to discharge/monitor patient progress toward functional and medical goals  Care Tool:  Bathing    Body parts bathed by patient: Right arm, Left arm, Chest, Abdomen, Front perineal area, Buttocks, Right upper leg, Left upper leg, Face     Body parts n/a: Right lower leg, Left lower leg   Bathing assist Assist Level: Contact Guard/Touching assist     Upper Body Dressing/Undressing Upper body dressing   What is the patient wearing?: Pull over shirt  Upper body assist Assist Level: Set up assist    Lower Body Dressing/Undressing Lower body dressing      What is the patient wearing?: Pants     Lower body assist Assist for lower body dressing: Supervision/Verbal cueing     Toileting Toileting    Toileting assist Assist for toileting: Supervision/Verbal cueing     Transfers Chair/bed transfer  Transfers assist     Chair/bed transfer assist level: Independent with assistive device Chair/bed transfer assistive device: Geologist, Engineering   Ambulation assist   Ambulation activity did not occur: Safety/medical  concerns (pain, weakness, decreased balance)  Assist level: Independent with assistive device Assistive device: Walker-rolling Max distance: 157ft   Walk 10 feet activity   Assist  Walk 10 feet activity did not occur: Safety/medical concerns (pain, weakness, decreased balance)  Assist level: Independent with assistive device Assistive device: Walker-rolling   Walk 50 feet activity   Assist Walk 50 feet with 2 turns activity did not occur: Safety/medical concerns (pain, weakness, decreased balance)  Assist level: Independent with assistive device Assistive device: Walker-rolling    Walk 150 feet activity   Assist Walk 150 feet activity did not occur: Safety/medical concerns (pain, weakness, decreased balance)  Assist level: Independent with assistive device Assistive device: Walker-rolling    Walk 10 feet on uneven surface  activity   Assist Walk 10 feet on uneven surfaces activity did not occur: Safety/medical concerns (pain, weakness, decreased balance)   Assist level: Supervision/Verbal cueing Assistive device: Walker-rolling   Wheelchair     Assist Is the patient using a wheelchair?: Yes Type of Wheelchair: Manual Wheelchair activity did not occur: Safety/medical concerns (pain, weakness)  Wheelchair assist level: Independent Max wheelchair distance: 152ft    Wheelchair 50 feet with 2 turns activity    Assist    Wheelchair 50 feet with 2 turns activity did not occur: Safety/medical concerns (pain, weakness)   Assist Level: Independent   Wheelchair 150 feet activity     Assist  Wheelchair 150 feet activity did not occur: Safety/medical concerns (pain, weakness)   Assist Level: Independent   Blood pressure (!) 138/41, pulse (!) 55, temperature 97.6 F (36.4 C), resp. rate 19, height 4' 10 (1.473 m), weight 56.5 kg, SpO2 99%.   Medical Problem List and Plan: 1. Functional deficits secondary to Debility due to RLE ischemia s/p  fasciotomy x4 with wounds on RLE             -patient may  shower if covers incisions             -ELOS/Goals: ~ 10-14 days- supervision to intermittent mod I             Chart and therapy notes reviewed, continue CIR, discussed patient's progress with therapy Grounds pass ordered Vitamin D3/Metanx/Vitamin B/C complex ordered F/u with me or Fidela in clinic within 1 month Would benefit from home health aide upon discharge   -Heparin  decreased to q12H 2/2 anemia, Hgb has now improved to 10.3, continue this dose             -antiplatelet therapy: Aspirin  and Plavix    This patient is capable of making decisions on her own behalf.   2. Pain:  A) Diabetic peripheral neuropathy, chronic: As needed Tylenol , Voltaren , and oxycodone . 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 Discussed with patient getting MRI but she  defers   3. Fungal infection of toes: antifungal cream added   5. Yeast infection of left groin incision: diflucan  started   6. RLE fasciotomy:  -Daily dressing changes to fasciotomies and L groin.  Linezolid  started for cellulitis Discussed with patient that vascular has said staples can be removed 3-4 weeks after her procedure, which was 12/13  7. AKI on CKD: restarted IVF at 75mls/hour   8. Critical limb ischemia: Continue DAPT indefinitely.  Follow-up with [facility] outpatient   9.  PAD: Continue aspirin . Statin being held d/t rhabdomyolysis.    10.  Hypotension: d/c norvasc , Coreg  6.25 mg twice daily. Monitor BP per protocol.   11.   Chronic hx of Adrenalectomy 2018: On home Cortef  10 mg daily and 5 mg nightly.    12.  Hypothyroidism: Conitnue Synthroid  75 mcg.    13.  Severe vitamin D  deficiency, level was 8 on 12/15, increase D3 to 3,000U daily   14.  T2DM: A1c 10.3-monitor CBG AC/HS with SSI NovoLog , increase lantus  to 13 U  BID   15. Constipation:              -Decrease miralax  to daily, continue colace daily. Simethicone  prn. LBM 12/18, d/c prn milk of mag given kidney function, sorbitol  ordered 12/20- messaged nursing to see if she had results with this  16. Dysuria: UA not suggestive of bacterial infection, diflucan  started as above, Bactrim  d/ced  LOS: 9 days A FACE TO FACE EVALUATION WAS PERFORMED  Sven SQUIBB Jaelani Posa 04/15/2024, 9:24 AM     "

## 2024-04-16 ENCOUNTER — Other Ambulatory Visit (HOSPITAL_COMMUNITY): Payer: Self-pay

## 2024-04-16 ENCOUNTER — Encounter: Payer: Self-pay | Admitting: Hematology

## 2024-04-16 LAB — CBC WITH DIFFERENTIAL/PLATELET
Abs Immature Granulocytes: 0.19 K/uL — ABNORMAL HIGH (ref 0.00–0.07)
Basophils Absolute: 0.1 K/uL (ref 0.0–0.1)
Basophils Relative: 1 %
Eosinophils Absolute: 0.1 K/uL (ref 0.0–0.5)
Eosinophils Relative: 1 %
HCT: 29.7 % — ABNORMAL LOW (ref 36.0–46.0)
Hemoglobin: 9.1 g/dL — ABNORMAL LOW (ref 12.0–15.0)
Immature Granulocytes: 2 %
Lymphocytes Relative: 10 %
Lymphs Abs: 0.9 K/uL (ref 0.7–4.0)
MCH: 30.3 pg (ref 26.0–34.0)
MCHC: 30.6 g/dL (ref 30.0–36.0)
MCV: 99 fL (ref 80.0–100.0)
Monocytes Absolute: 0.7 K/uL (ref 0.1–1.0)
Monocytes Relative: 7 %
Neutro Abs: 7.3 K/uL (ref 1.7–7.7)
Neutrophils Relative %: 79 %
Platelets: 406 K/uL — ABNORMAL HIGH (ref 150–400)
RBC: 3 MIL/uL — ABNORMAL LOW (ref 3.87–5.11)
RDW: 15.6 % — ABNORMAL HIGH (ref 11.5–15.5)
WBC: 9.3 K/uL (ref 4.0–10.5)
nRBC: 0 % (ref 0.0–0.2)

## 2024-04-16 LAB — BASIC METABOLIC PANEL WITH GFR
Anion gap: 10 (ref 5–15)
BUN: 39 mg/dL — ABNORMAL HIGH (ref 8–23)
CO2: 17 mmol/L — ABNORMAL LOW (ref 22–32)
Calcium: 8.8 mg/dL — ABNORMAL LOW (ref 8.9–10.3)
Chloride: 107 mmol/L (ref 98–111)
Creatinine, Ser: 1.89 mg/dL — ABNORMAL HIGH (ref 0.44–1.00)
GFR, Estimated: 26 mL/min — ABNORMAL LOW
Glucose, Bld: 255 mg/dL — ABNORMAL HIGH (ref 70–99)
Potassium: 5.1 mmol/L (ref 3.5–5.1)
Sodium: 134 mmol/L — ABNORMAL LOW (ref 135–145)

## 2024-04-16 LAB — GLUCOSE, CAPILLARY
Glucose-Capillary: 116 mg/dL — ABNORMAL HIGH (ref 70–99)
Glucose-Capillary: 127 mg/dL — ABNORMAL HIGH (ref 70–99)
Glucose-Capillary: 147 mg/dL — ABNORMAL HIGH (ref 70–99)
Glucose-Capillary: 256 mg/dL — ABNORMAL HIGH (ref 70–99)

## 2024-04-16 MED ORDER — LIDOCAINE 5 % EX PTCH
3.0000 | MEDICATED_PATCH | CUTANEOUS | 0 refills | Status: AC
  Filled 2024-04-16: qty 30, 10d supply, fill #0

## 2024-04-16 MED ORDER — ONETOUCH ULTRASOFT LANCETS MISC
0 refills | Status: AC
  Filled 2024-04-16: qty 60, fill #0

## 2024-04-16 MED ORDER — TERBINAFINE HCL 1 % EX CREA: TOPICAL_CREAM | Freq: Every day | CUTANEOUS | Status: AC

## 2024-04-16 MED ORDER — HEPARIN SOD (PORK) LOCK FLUSH 100 UNIT/ML IV SOLN
500.0000 [IU] | INTRAVENOUS | Status: DC
  Administered 2024-04-16: 500 [IU]
  Filled 2024-04-16: qty 5

## 2024-04-16 MED ORDER — INSULIN GLARGINE 100 UNIT/ML SOLOSTAR PEN
14.0000 [IU] | PEN_INJECTOR | Freq: Two times a day (BID) | SUBCUTANEOUS | 0 refills | Status: AC
  Filled 2024-04-16: qty 9, 32d supply, fill #0

## 2024-04-16 MED ORDER — POLYETHYLENE GLYCOL 3350 17 GM/SCOOP PO POWD
17.0000 g | Freq: Every day | ORAL | 0 refills | Status: AC
  Filled 2024-04-16: qty 238, 14d supply, fill #0

## 2024-04-16 MED ORDER — LINEZOLID 600 MG PO TABS
600.0000 mg | ORAL_TABLET | Freq: Two times a day (BID) | ORAL | 0 refills | Status: AC
  Filled 2024-04-16: qty 6, 3d supply, fill #0

## 2024-04-16 MED ORDER — BLOOD GLUCOSE TEST VI STRP: 1.0000 | ORAL_STRIP | 0 refills | Status: DC

## 2024-04-16 MED ORDER — LANCETS MISC: 1.0000 | 0 refills | Status: DC

## 2024-04-16 MED ORDER — INSULIN PEN NEEDLE 32G X 4 MM MISC
1.0000 | 0 refills | Status: DC
  Filled 2024-04-16: qty 200, 32d supply, fill #0

## 2024-04-16 MED ORDER — B COMPLEX-C PO TABS
1.0000 | ORAL_TABLET | Freq: Every day | ORAL | 0 refills | Status: AC
  Filled 2024-04-16: qty 100, 100d supply, fill #0

## 2024-04-16 MED ORDER — ONETOUCH ULTRA TEST VI STRP
ORAL_STRIP | 12 refills | Status: AC
  Filled 2024-04-16: qty 100, fill #0

## 2024-04-16 MED ORDER — VITAMIN D3 25 MCG PO TABS
3000.0000 [IU] | ORAL_TABLET | Freq: Every day | ORAL | 0 refills | Status: AC
  Filled 2024-04-16: qty 90, 30d supply, fill #0

## 2024-04-16 MED ORDER — LANCET DEVICE MISC: 1.0000 | 0 refills | Status: DC

## 2024-04-16 MED ORDER — SENNOSIDES-DOCUSATE SODIUM 8.6-50 MG PO TABS
1.0000 | ORAL_TABLET | Freq: Every day | ORAL | 0 refills | Status: AC
  Filled 2024-04-16: qty 30, 30d supply, fill #0

## 2024-04-16 MED ORDER — ASPIRIN 81 MG PO TBEC
81.0000 mg | DELAYED_RELEASE_TABLET | Freq: Every day | ORAL | 0 refills | Status: DC
  Filled 2024-04-16: qty 30, 30d supply, fill #0

## 2024-04-16 MED ORDER — BLOOD GLUCOSE MONITORING SUPPL DEVI: 1.0000 | 0 refills | Status: DC

## 2024-04-16 MED ORDER — OXYCODONE HCL 10 MG PO TABS
10.0000 mg | ORAL_TABLET | ORAL | 0 refills | Status: DC | PRN
  Filled 2024-04-16: qty 20, 4d supply, fill #0

## 2024-04-16 MED ORDER — SORBITOL 70 % SOLN
30.0000 mL | Freq: Once | Status: AC
  Administered 2024-04-16: 30 mL via ORAL
  Filled 2024-04-16: qty 30

## 2024-04-16 MED ORDER — HEPARIN SOD (PORK) LOCK FLUSH 100 UNIT/ML IV SOLN
500.0000 [IU] | INTRAVENOUS | Status: DC | PRN
  Administered 2024-04-16: 500 [IU]
  Filled 2024-04-16: qty 5

## 2024-04-16 MED ORDER — NOVOLOG FLEXPEN 100 UNIT/ML ~~LOC~~ SOPN
0.0000 [IU] | PEN_INJECTOR | Freq: Three times a day (TID) | SUBCUTANEOUS | 11 refills | Status: DC
  Filled 2024-04-16: qty 9, 25d supply, fill #0

## 2024-04-16 MED ORDER — INSULIN GLARGINE 100 UNIT/ML ~~LOC~~ SOLN
14.0000 [IU] | Freq: Two times a day (BID) | SUBCUTANEOUS | Status: DC
  Administered 2024-04-16 – 2024-04-17 (×3): 14 [IU] via SUBCUTANEOUS
  Filled 2024-04-16 (×4): qty 0.14

## 2024-04-16 MED ORDER — MELATONIN 3 MG PO TABS: 3.0000 mg | ORAL_TABLET | Freq: Every day | ORAL | Status: AC

## 2024-04-16 NOTE — Plan of Care (Signed)
" °  Problem: RH Eating Goal: LTG Patient will perform eating w/assist, cues/equip (OT) Description: LTG: Patient will perform eating with assist, with/without cues using equipment (OT) Outcome: Completed/Met   Problem: RH Grooming Goal: LTG Patient will perform grooming w/assist,cues/equip (OT) Description: LTG: Patient will perform grooming with assist, with/without cues using equipment (OT) Outcome: Completed/Met   Problem: RH Bathing Goal: LTG Patient will bathe all body parts with assist levels (OT) Description: LTG: Patient will bathe all body parts with assist levels (OT) Outcome: Completed/Met   Problem: RH Dressing Goal: LTG Patient will perform upper body dressing (OT) Description: LTG Patient will perform upper body dressing with assist, with/without cues (OT). Outcome: Completed/Met Goal: LTG Patient will perform lower body dressing w/assist (OT) Description: LTG: Patient will perform lower body dressing with assist, with/without cues in positioning using equipment (OT) Outcome: Completed/Met   Problem: RH Toileting Goal: LTG Patient will perform toileting task (3/3 steps) with assistance level (OT) Description: LTG: Patient will perform toileting task (3/3 steps) with assistance level (OT)  Outcome: Completed/Met   Problem: RH Light Housekeeping Goal: LTG Patient will perform light housekeeping w/assist (OT) Description: LTG: Patient will perform light housekeeping with assistance, with/without cues (OT). Outcome: Completed/Met   Problem: RH Toilet Transfers Goal: LTG Patient will perform toilet transfers w/assist (OT) Description: LTG: Patient will perform toilet transfers with assist, with/without cues using equipment (OT) Outcome: Completed/Met   Problem: RH Tub/Shower Transfers Goal: LTG Patient will perform tub/shower transfers w/assist (OT) Description: LTG: Patient will perform tub/shower transfers with assist, with/without cues using equipment (OT) Outcome:  Completed/Met   "

## 2024-04-16 NOTE — Progress Notes (Signed)
 Occupational Therapy Discharge Summary  Patient Details  Name: Audrey [name] MRN: [id number] Date of Birth: 12-07-1940  Date of Discharge from OT service:April 16, 2024  Patient has met 9 of 9 long term goals due to improved activity tolerance, improved balance, functional use of  RIGHT lower extremity, improved awareness, and improved coordination.  Patient to discharge at overall Modified Independent level.  Patient's daughter will be staying with pt for a week, then pt is capable to care for self at the mod I level.  All goals met  Recommendation:  Patient will benefit from ongoing skilled OT services in home health setting to continue to advance functional skills in the area of BADL and iADL.  Equipment: No equipment provided  Reasons for discharge: treatment goals met  Patient/family agrees with progress made and goals achieved: Yes  OT Discharge Precautions/Restrictions  Precautions Precautions: Fall Restrictions Weight Bearing Restrictions Per Provider Order: No RLE Weight Bearing Per Provider Order: Weight bearing as tolerated ADL ADL Equipment Provided: Reacher Eating: Independent Where Assessed-Eating: Wheelchair Grooming: Independent Where Assessed-Grooming: Standing at sink Upper Body Bathing: Modified independent Where Assessed-Upper Body Bathing: Shower Lower Body Bathing: Modified independent Where Assessed-Lower Body Bathing: Shower Upper Body Dressing: Independent Where Assessed-Upper Body Dressing: Sitting at sink Lower Body Dressing: Modified independent Where Assessed-Lower Body Dressing: Sitting at sink, Standing at sink Toileting: Modified independent Where Assessed-Toileting: Bedside Commode, Toilet Toilet Transfer: Modified independent Toilet Transfer Method: Proofreader: Raised toilet seat Tub/Shower Transfer: Modified independent Web Designer Method: Ship Broker: Secondary School Teacher: Modified independent Film/video Editor Method: Ambulating ADL Comments: RW for ambulation Vision Baseline Vision/History: 1 Wears glasses Patient Visual Report: No change from baseline Vision Assessment?: No apparent visual deficits Perception  Perception: Within Functional Limits Praxis Praxis: WFL Cognition Cognition Overall Cognitive Status: Within Functional Limits for tasks assessed Arousal/Alertness: Awake/alert Orientation Level: Person;Place;Situation Person: Oriented Place: Oriented Situation: Oriented Memory: Appears intact Awareness: Appears intact Problem Solving: Appears intact Safety/Judgment: Appears intact Brief Interview for Mental Status (BIMS) Repetition of Three Words (First Attempt): 3 Temporal Orientation: Year: Correct Temporal Orientation: Month: Accurate within 5 days Temporal Orientation: Day: Correct Recall: Sock: Yes, no cue required Recall: Blue: Yes, no cue required Recall: Bed: Yes, no cue required BIMS Summary Score: 15 Sensation Sensation Light Touch: Impaired by gross assessment Hot/Cold: Not tested Proprioception: Appears Intact Stereognosis: Not tested Additional Comments: 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. Absent sensation along R great toe Coordination Gross Motor Movements are Fluid and Coordinated: Yes Fine Motor Movements are Fluid and Coordinated: Yes Motor  Motor Motor: Within Functional Limits Motor - Skilled Clinical Observations: pain in RLE Mobility  Bed Mobility Bed Mobility: Rolling Right;Rolling Left;Sit to Supine;Supine to Sit Rolling Right: Independent with assistive device Rolling Left: Independent with assistive device Supine to Sit: Independent with assistive device Sit to Supine: Independent with assistive device Transfers Sit to Stand: Independent with assistive device Stand to Sit: Independent with assistive device  Trunk/Postural Assessment  Cervical  Assessment Cervical Assessment: Exceptions to [facility] (forward head) Thoracic Assessment Thoracic Assessment: Exceptions to [facility] (kyphosis) Lumbar Assessment Lumbar Assessment: Exceptions to [facility] (posterior pelvic tilt) Postural Control Postural Control: Within Functional Limits  Balance Balance Balance Assessed: Yes Static Sitting Balance Static Sitting - Balance Support: Feet supported;No upper extremity supported Static Sitting - Level of Assistance: 7: Independent Dynamic Sitting Balance Dynamic Sitting - Balance Support: Feet supported;No upper extremity supported Dynamic Sitting - Level of Assistance: 6: Modified  independent (Device/Increase time) Static Standing Balance Static Standing - Balance Support: Bilateral upper extremity supported;During functional activity (RW) Static Standing - Level of Assistance: 6: Modified independent (Device/Increase time) Dynamic Standing Balance Dynamic Standing - Balance Support: Bilateral upper extremity supported;During functional activity (RW) Dynamic Standing - Level of Assistance: 6: Modified independent (Device/Increase time) Dynamic Standing - Comments: with transfers and ambulation Extremity/Trunk Assessment RUE Assessment RUE Assessment: Within Functional Limits LUE Assessment LUE Assessment: Within Functional Limits   Eeva Schlosser E Kanasia Gayman, MS, OTR/L  04/16/2024, 7:56 AM

## 2024-04-16 NOTE — Progress Notes (Signed)
 VAST consult received to obtain labwork from port and change port needle. Patient in therapy.

## 2024-04-16 NOTE — Progress Notes (Signed)
 Inpatient Rehabilitation Care Coordinator Discharge Note   Patient Details  Name: Audrey [name] MRN: [id number] Date of Birth: May 14, 1940   Discharge location: Home alone with family support  Length of Stay: 10 days  Discharge activity level: Independent with assistive device  Home/community participation: Limited activity in the community  Patient response un:Yzjouy Literacy - How often do you need to have someone help you when you read instructions, pamphlets, or other written material from your doctor or pharmacy?: Never  Patient response un:Dnrpjo Isolation - How often do you feel lonely or isolated from those around you?: Never  Services provided included: MD, RD, PT, OT, SLP, RN, CM, TR, Pharmacy, SW  Financial Services:  Financial Services Utilized: Medicare    Choices offered to/list presented to: Patient  Follow-up services arranged:  Home Health, DME Home Health Agency: [facility] PT/OT    DME : [facility] - [facility]    Patient response to transportation need: Is the patient able to respond to transportation needs?: Yes In the past 12 months, has lack of transportation kept you from medical appointments or from getting medications?: No In the past 12 months, has lack of transportation kept you from meetings, work, or from getting things needed for daily living?: No   Patient/Family verbalized understanding of follow-up arrangements:  Yes  Individual responsible for coordination of the follow-up plan: Patient  Confirmed correct DME delivered: Audrey [name] 04/16/2024    Comments (or additional information): Patient will discharge home alone with family support. HH set up and DME already delivered to go home with.   Summary of Stay    Date/Time Discharge Planning CSW  04/13/24 1036 Plans to discharge home alone with support from family and friends. Family education recommended - awaiting response from family. Will await further therapy follow-up  recommendations. DS  04/07/24 1056 TBA AAC       Audrey [name]

## 2024-04-16 NOTE — Progress Notes (Signed)
 Inpatient Rehabilitation Discharge Medication Review by a Pharmacist  A complete drug regimen review was completed for this patient to identify any potential clinically significant medication issues.  High Risk Drug Classes Is patient taking? Indication by Medication  Antipsychotic No   Anticoagulant No   Antibiotic Yes Zyvox - [EOT 12/28]  Opioid Yes oxyIR- acute pain  Antiplatelet Yes Asa, plavix - cva ppx  Hypoglycemics/insulin  Yes Insulin - T2DM  Vasoactive Medication No   Chemotherapy No   Other Yes Synthroid - hypothyroidism     Type of Medication Issue Identified Description of Issue Recommendation(s)  Drug Interaction(s) (clinically significant)     Duplicate Therapy     Allergy     No Medication Administration End Date     Incorrect Dose     Additional Drug Therapy Needed     Significant med changes from prior encounter (inform family/care partners about these prior to discharge). Coreg /Norvasc /Prandin  Not continued at discharge  Other       Clinically significant medication issues were identified that warrant physician communication and completion of prescribed/recommended actions by midnight of the next day:  No   Time spent performing this drug regimen review (minutes):  30   Anureet Bruington BS, PharmD, BCPS Clinical Pharmacist 04/16/2024 2:04 PM  Contact: [phone] after 3 PM

## 2024-04-16 NOTE — Progress Notes (Signed)
 Occupational Therapy Session Note  Patient Details  Name: Audrey [name] MRN: [id number] Date of Birth: [birth year]  Today's Date: 04/16/2024 OT Individual Time: 1305-1330 & 1450-1530 OT Individual Time Calculation (min): 25 min & 40 min   Short Term Goals: Week 2:  OT Short Term Goal 1 (Week 2): STG = LTG due to ELOS  Skilled Therapeutic Interventions/Progress Updates:  Session 1 Skilled OT intervention completed with focus on functional mobility. Pt received seated in w/c, agreeable to session. No pain reported.  Pt cleared by therapies for mod I ambulation in room with RW. Pt requested to pack up room items for DC tomorrow. Pt was able to gather items at the ambulatory level using RW with mod I, reach to floor level to retrieve items and open/close doors without difficulty. Pt also ambulated without AD and supervision for short distances. Discussed potential use of rollator at home due to increased mobility and pt confidence level which pt has at home. Pt able to manage toileting for continent urinary void with mod I. Pt remained seated EOB with all needs in reach at end of session.  Session 2 Skilled OT intervention completed with focus on cardiovascular and ambulatory endurance. Pt received seated EOB, agreeable to session. No pain reported.  Completed all sit > stands and ambulatory transfers mod I with RW. Ambulated > 100 ft > gym.   Pt completed the following intervals on nustep to promote RLE ROM needed for independence with BADLs and functional mobility: -10 mins, level 2  RLE wound noted to be drainage clear fluid. Advised pt to pat dry.   Ambulated 50 ft with RW, then ambulated using R handrail another 50 ft with CGA. Pt seeking furniture for confidence with rail gaps, and in room demonstrated increased instability when asked not to furniture walk.   Pt remained seated EOB, with all needs in reach at end of session.   Therapy Documentation Precautions:   Precautions Precautions: Fall Recall of Precautions/Restrictions: Intact Restrictions Weight Bearing Restrictions Per Provider Order: No RLE Weight Bearing Per Provider Order: Weight bearing as tolerated    Therapy/Group: Individual Therapy  [name], MS, OTR/L  04/16/2024, 3:47 PM

## 2024-04-16 NOTE — Discharge Summary (Signed)
 Physician Discharge Summary  Patient ID: Audrey [name] MRN: [id number] DOB/AGE: 1940-09-30 83 y.o.  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 COMPARISON: None available. CLINICAL HISTORY: Baker's cyst of knee, right. FINDINGS: SOFT TISSUES: Targeted ultrasound was performed over the posterior aspect of the right knee. No ultrasound abnormality identified. Specifically, no signs of focal fluid collection including abscess or popliteal fossa cyst. No solid mass. IMPRESSION: 1. No focal abnormality identified in the posterior aspect of the right knee, including no popliteal fossa cyst. Electronically signed by: Waddell Calk MD 04/10/2024 01:03 PM EST RP Workstation: [id number]   ECHOCARDIOGRAM COMPLETE Result Date: 04/04/2024    ECHOCARDIOGRAM REPORT   Patient Name:   Audrey [name] [facility] Date of Exam: 04/04/2024 Medical Rec #:  [id number]   Height:       58.0 in Accession #:    [id number]  Weight:       120.0 lb Date of Birth:  1940/07/28  BSA:          1.466 m Patient Age:    83 years    BP:           144/65 mmHg Patient Gender: F           HR:            68 bpm. Exam Location:  Inpatient Procedure: 2D Echo (Both Spectral and Color Flow Doppler were utilized during            procedure). Indications:    abnormal ecg  History:        Patient has no prior history of Echocardiogram examinations.                 CAD, PAD and metastatic cancer. chronic kidney disease; Risk                 Factors:Hypertension, Dyslipidemia and Diabetes.  Sonographer:    Tinnie Barefoot RDCS Referring Phys: [phone] PAULA B SIMPSON IMPRESSIONS  1. Left ventricular ejection fraction, by estimation, is 50 to 55%. The left ventricle has low normal function. The left ventricle has no regional wall motion abnormalities. There is mild left ventricular hypertrophy. Left ventricular diastolic parameters are indeterminate.  2. Right ventricular systolic function is normal. The right ventricular size is normal. Tricuspid regurgitation signal is inadequate for assessing PA pressure.  3. The mitral valve is normal in structure. Trivial mitral valve regurgitation. No evidence of mitral stenosis.  4. The aortic valve is tricuspid. Aortic valve regurgitation is not visualized. Aortic valve sclerosis is present, with no evidence of aortic valve stenosis.  5. The inferior vena cava is normal in size with greater than  50% respiratory variability, suggesting right atrial pressure of 3 mmHg. FINDINGS  Left Ventricle: Left ventricular ejection fraction, by estimation, is 50 to 55%. The left ventricle has low normal function. The left ventricle has no regional wall motion abnormalities. The left ventricular internal cavity size was normal in size. There is mild left ventricular hypertrophy. Left ventricular diastolic parameters are indeterminate. Right Ventricle: The right ventricular size is normal. No increase in right ventricular wall thickness. Right ventricular systolic function is normal. Tricuspid regurgitation signal is inadequate for assessing PA pressure. Left Atrium: Left atrial size was normal in  size. Right Atrium: Right atrial size was normal in size. Pericardium: There is no evidence of pericardial effusion. Mitral Valve: The mitral valve is normal in structure. Trivial mitral valve regurgitation. No evidence of mitral valve stenosis. Tricuspid Valve: The tricuspid valve is normal in structure. Tricuspid valve regurgitation is trivial. Aortic Valve: The aortic valve is tricuspid. Aortic valve regurgitation is not visualized. Aortic valve sclerosis is present, with no evidence of aortic valve stenosis. Pulmonic Valve: The pulmonic valve was not well visualized. Pulmonic valve regurgitation is trivial. Aorta: The aortic root and ascending aorta are structurally normal, with no evidence of dilitation. Venous: The inferior vena cava is normal in size with greater than 50% respiratory variability, suggesting right atrial pressure of 3 mmHg. IAS/Shunts: The interatrial septum was not well visualized.  LEFT VENTRICLE PLAX 2D LVIDd:         4.30 cm     Diastology LVIDs:         3.20 cm     LV e' medial:    5.11 cm/s LV PW:         0.90 cm     LV E/e' medial:  13.6 LV IVS:        1.00 cm     LV e' lateral:   7.72 cm/s LVOT diam:     2.00 cm     LV E/e' lateral: 9.0 LV SV:         58 LV SV Index:   40 LVOT Area:     3.14 cm LV IVRT:       120 msec  LV Volumes (MOD) LV vol d, MOD A2C: 64.3 ml LV vol d, MOD A4C: 56.9 ml LV vol s, MOD A2C: 27.8 ml LV vol s, MOD A4C: 29.3 ml LV SV MOD A2C:     36.5 ml LV SV MOD A4C:     56.9 ml LV SV MOD BP:      31.8 ml RIGHT VENTRICLE             IVC RV Basal diam:  2.60 cm     IVC diam: 1.50 cm RV S prime:     16.30 cm/s TAPSE (M-mode): 1.7 cm      PULMONARY VEINS                             Diastolic Velocity: 44.60 cm/s                             S/D Velocity:       1.30                             Systolic Velocity:  56.10 cm/s LEFT ATRIUM  Index        RIGHT ATRIUM           Index LA diam:        3.20 cm 2.18 cm/m   RA Area:     13.40 cm LA Vol (A2C):   33.0 ml  22.51 ml/m  RA Volume:   30.00 ml  20.47 ml/m LA Vol (A4C):   45.1 ml 30.77 ml/m LA Biplane Vol: 39.4 ml 26.88 ml/m  AORTIC VALVE LVOT Vmax:   84.60 cm/s LVOT Vmean:  53.400 cm/s LVOT VTI:    0.185 m  AORTA Ao Root diam: 3.30 cm Ao Asc diam:  3.20 cm MITRAL VALVE MV Area (PHT): 3.17 cm    SHUNTS MV Decel Time: 239 msec    Systemic VTI:  0.18 m MR Peak grad: 90.6 mmHg    Systemic Diam: 2.00 cm MR Mean grad: 61.0 mmHg MR Vmax:      476.00 cm/s MR Vmean:     373.0 cm/s MV E velocity: 69.70 cm/s MV A velocity: 71.00 cm/s MV E/A ratio:  0.98 Lonni Nanas MD Electronically signed by Lonni Nanas MD Signature Date/Time: 04/04/2024/4:09:35 PM    Final    HYBRID OR IMAGING ([facility] ONLY) Result Date: 04/03/2024 There is no interpretation for this exam.  This order is for images obtained during a surgical procedure.  Please See Surgeries Tab for more information regarding the procedure.   PERIPHERAL VASCULAR CATHETERIZATION Result Date: 04/02/2024 Table formatting from the original result was not included. Images from the original result were not included. DATE OF SERVICE: 04/02/2024  PATIENT:  Jenkins SAUNDERS Scerbo  83 y.o. female  PRE-OPERATIVE DIAGNOSIS:  thrombosis of right lower extremity arterial stenting  POST-OPERATIVE DIAGNOSIS:  Same  PROCEDURE:  1) Ultrasound guided left common femoral artery access 2) Aortogram 3) Right lower extremity angiogram with second order cannulation 4) Additional right lower extremity angiogram with third order cannulation 5) Right peroneal, TP trunk, popliteal artery angioplasty (3x191mm Sterling) 6) Initiation of catheter directed thrombolysis (EKOS) 7) Conscious sedation (43 minutes)  SURGEON:  Debby SAILOR. Magda, MD  ASSISTANT: none  ANESTHESIA:   local and IV sedation  ESTIMATED BLOOD LOSS: min  LOCAL MEDICATIONS USED:  LIDOCAINE   COUNTS: confirmed correct.  PATIENT DISPOSITION:  PACU - hemodynamically stable.  Delay start of Pharmacological VTE agent (>24hrs) due to  surgical blood loss or risk of bleeding: no  INDICATION FOR PROCEDURE: Audrey [name] is a 83 y.o. female with right leg pain in setting of thrombosed sfa / pop / TP trunk stenting. After careful discussion of risks, benefits, and alternatives the patient was offered angiogram. The patient understood and wished to proceed.  OPERATIVE FINDINGS:  Aortogram: Renal arteries Left: patent      Right: patent Infrarenal aorta: patent Common iliac arteries: Left: patent      Right: patent Internal iliac arteries: Left: patent      Right: patent External iliac arteries: Left: patent      Right: patent  Right Lower Extremity Angiogram:             Common femoral artery: patent             Profunda femoris artery: patent             Superficial femoral artery: patent proximally; occludes as stenting begins around Hunter's canal             Popliteal artery: occluded stents above, behind, and below the knee  Anterior tibial artery: occluded             Tibioperoneal trunk: occluded stent             Peroneal artery: reconstitutes, but courses only to the high ankle             Posterior tibial artery: occluded             Pedal circulation: no contrast visualized  GLASS score. N/A  WIfI score. N/A  DESCRIPTION OF PROCEDURE: After identification of the patient in the pre-operative holding area, the patient was transferred to the operating room. The patient was positioned supine on the operating room table. Anesthesia was induced. The groins was prepped and draped in standard fashion. A surgical pause was performed confirming correct patient, procedure, and operative location.  The left groin was anesthetized with subcutaneous injection of 1% lidocaine . Using ultrasound guidance, the left common femoral artery was accessed with micropuncture technique. Fluoroscopy was used to confirm cannulation over the femoral head. The 98F micropuncture sheath was upsized to 52F.  A Benson wire was advanced into the distal aorta. Over the  wire an omni flush catheter was advanced to the level of L2. Aortogram was performed - see above for details.  The right common iliac artery was selected with an omniflush catheter and glidewire guidewire. The wire was advanced into the common femoral artery. Over the wire the omni flush catheter was advanced into the external iliac artery. Selective angiography was performed - see above for details.  The decision was made to intervene. The patient was heparinized with 6000 units of heparin . The 52F sheath was exchanged for a 9F x 55cm sheath. Selective angiography of the right lower extremity performed prior to intervention.  I was able to cross the occluded stents fairly easily with a V18 wire.  The V18 wire would not advance much beyond the TP trunk stent, and so at both the sheath platform into the SFA.  With additional support I was able to cross into the native peroneal artery with a 6 g shepherd wire.  I then performed balloon angioplasty of the proximal peroneal artery, TP trunk, and popliteal artery with a 3 x 150 mm Sterling balloon.  Third order angiogram was then performed to confirm flow below this intervention.  The peroneal artery appeared occluded in the high ankle.  The anterior tibial artery did seem to reconstitute.  Given the ease of wire passage and the fairly acute appearing occlusion, elected to initiate catheter directed thrombolysis.  An EKOS catheter was advanced over the wire into the native peroneal artery extending back into the superficial femoral artery.  Catheter directed thrombolysis was initiated.  Conscious sedation was administered with the use of IV fentanyl  and midazolam  under continuous physician and nurse monitoring.  Heart rate, blood pressure, and oxygen saturation were continuously monitored.  Total sedation time was 43 minutes  Upon completion of the case instrument and sharps counts were confirmed correct. The patient was transferred to the PACU in good condition. I was  present for all portions of the procedure.  PLAN: Thrombolysis overnight.  Return to OR tomorrow for catheter recheck.  Debby SAILOR. Magda, MD [facility] Phone Number: [phone] 04/02/2024 12:14 PM    VAS US  ABI WITH/WO TBI Result Date: 04/01/2024  LOWER EXTREMITY DOPPLER STUDY Patient Name:  PAIJE GOODHART  Date of Exam:   04/01/2024 Medical Rec #: [id number]    Accession #:  [id number] Date of Birth: [birth year]   Patient Gender: F Patient Age:   70 years Exam Location:  [address] Procedure:      VAS US  ABI WITH/WO TBI Referring Phys: PENNE COLORADO --------------------------------------------------------------------------------  Indications: Rest pain. High Risk Factors: Hypertension, hyperlipidemia, Diabetes, past history of                    smoking, coronary artery disease.  Vascular Interventions: 12/29/23 laser atherectomy right SFA, popliteal and TP                         trunk. Drug eluting stent right TP trunk with 4x38 mm                         Synergy. Drug coated PTA right SFA and popliteal artery                         stents with 5 mm Ranger.                         10/21/2022: Stent of right popliteal and SFA with 6 x 150                         mm Eluvia proximal and distal 6 x 60 mm Eluvia, and                         plain PTA of the right ATA. Performing Technologist: [employer] BS, RVT, RDCS  Examination Guidelines: A complete evaluation includes at minimum, Doppler waveform signals and systolic blood pressure reading at the level of bilateral brachial, anterior tibial, and posterior tibial arteries, when vessel segments are accessible. Bilateral testing is considered an integral part of a complete examination. Photoelectric Plethysmograph (PPG) waveforms and toe systolic pressure readings are included as required and additional duplex testing as needed. Limited examinations for reoccurring indications may be performed as noted.  ABI  Findings: +---------+------------------+-----+--------+--------+ Right    Rt Pressure (mmHg)IndexWaveformComment  +---------+------------------+-----+--------+--------+ Brachial 142                                     +---------+------------------+-----+--------+--------+ ATA                             absent           +---------+------------------+-----+--------+--------+ PTA                             absent           +---------+------------------+-----+--------+--------+ Great Toe                       Absent           +---------+------------------+-----+--------+--------+ +---------+------------------+-----+----------+-------+ Left     Lt Pressure (mmHg)IndexWaveform  Comment +---------+------------------+-----+----------+-------+ Brachial 138                                      +---------+------------------+-----+----------+-------+ ATA      84  0.59 monophasic        +---------+------------------+-----+----------+-------+ PTA                             absent            +---------+------------------+-----+----------+-------+ Great Toe30                0.21 Abnormal          +---------+------------------+-----+----------+-------+ +-------+-----------+-----------+------------+------------+ ABI/TBIToday's ABIToday's TBIPrevious ABIPrevious TBI +-------+-----------+-----------+------------+------------+ Right  0          0          .96         .40          +-------+-----------+-----------+------------+------------+ Left   .58        .32        .59         .21          +-------+-----------+-----------+------------+------------+  Right ABIs and TBIs appear decreased compared to prior study on 02/11/24. Left ABIs appear essentially unchanged compared to prior study on 02/11/24.  Summary: Right: Resting right ankle-brachial index indicates critical limb ischemia.  Left: Resting left ankle-brachial index indicates moderate  left lower extremity arterial disease. The left toe-brachial index is abnormal.  *See table(s) above for measurements and observations.  Electronically signed by Fonda Rim on 04/01/2024 at 9:35:27 AM.    Final    VAS US  LOWER EXTREMITY ARTERIAL DUPLEX Result Date: 04/01/2024 LOWER EXTREMITY ARTERIAL DUPLEX STUDY Patient Name:  LUCY BOARDMAN [facility]  Date of Exam:   04/01/2024 Medical Rec #: [id number]    Accession #:    [id number] Date of Birth: 1940/08/05   Patient Gender: F Patient Age:   17 years Exam Location:  [address] Procedure:      VAS US  LOWER EXTREMITY ARTERIAL DUPLEX Referring Phys: PENNE COLORADO --------------------------------------------------------------------------------  Indications: Rest pain, and peripheral artery disease. High Risk Factors: Hypertension, hyperlipidemia, Diabetes, past history of                    smoking, coronary artery disease.  Vascular Interventions: 12/29/23 laser atherectomy right SFA, popliteal and TP                         trunk. Drug eluting stent right TP trunk with 4x38 mm                         Synergy. Drug coated PTA right SFA and popliteal artery                         stents with 5 mm Ranger.                         10/21/2022: Stent of right popliteal and SFA with 6 x 150                         mm Eluvia proximal and distal 6 x 60 mm Eluvia, and                         plain PTA of the right ATA. Current ABI:            Right absent, left .59 Comparison Study: 02/11/24 showed patent right SFA, popliteal and  TP trunk                   stents. Performing Technologist: [employer] BS, RVT, RDCS  Examination Guidelines: A complete evaluation includes B-mode imaging, spectral Doppler, color Doppler, and power Doppler as needed of all accessible portions of each vessel. Bilateral testing is considered an integral part of a complete examination. Limited examinations for reoccurring indications may be performed as noted.   +-----------+--------+-----+--------+---------+----------+ RIGHT      PSV cm/sRatioStenosisWaveform Comments   +-----------+--------+-----+--------+---------+----------+ CFA Prox   94                   triphasic           +-----------+--------+-----+--------+---------+----------+ DFA        80                   triphasic           +-----------+--------+-----+--------+---------+----------+ SFA Prox   48                   triphasic           +-----------+--------+-----+--------+---------+----------+ SFA Mid    38                   biphasic            +-----------+--------+-----+--------+---------+----------+ SFA Distal              occludedabsent   stent area +-----------+--------+-----+--------+---------+----------+ POP Prox                occludedabsent              +-----------+--------+-----+--------+---------+----------+ POP Distal              occludedabsent              +-----------+--------+-----+--------+---------+----------+ TP Trunk                occludedabsent              +-----------+--------+-----+--------+---------+----------+ PTA Distal              occluded                    +-----------+--------+-----+--------+---------+----------+ PERO Distal             occluded                    +-----------+--------+-----+--------+---------+----------+  +--------------+-------+-----------+--------+--------+-----+--------+ Left PoplitealAP (cm)Transv (cm)WaveformStenosisShapeComments +--------------+-------+-----------+--------+--------+-----+--------+ Distal                                                        +--------------+-------+-----------+--------+--------+-----+--------+  Summary: Right: Total occlusion noted in the superficial femoral artery and/or popliteal artery. Total occlusion noted in the popliteal artery. Interval occlusion of right distal SFA, popliteal and TP trunk stents, with no flow detected in  the calf.  See table(s) above for measurements and observations. Electronically signed by Fonda Rim on 04/01/2024 at 9:35:18 AM.    Final     Labs:  Basic Metabolic Panel: Recent Labs  Lab 04/13/24 0417 04/14/24 1609  NA 134* 131*  K 5.5* 5.4*  CL 104 102  CO2 21* 20*  GLUCOSE 114* 209*  BUN 40* 41*  CREATININE 2.11* 2.17*  CALCIUM  8.7* 8.9    CBC:  Recent Labs  Lab 04/13/24 0417 04/14/24 0110  WBC 13.0* 11.2*  NEUTROABS  --  8.1*  HGB 9.1* 8.5*  HCT 28.7* 27.1*  MCV 97.6 97.8  PLT 359 357    CBG: Recent Labs  Lab 04/15/24 0629 04/15/24 1135 04/15/24 1606 04/15/24 1952 04/16/24 0558  GLUCAP 165* 177* 204* 241* 116*    Brief HPI:   CORTASIA SCREWS is a 83 y.o. female  with PMHx of DM II, GERD, HTN, HLD, hypothyroidism, CAD/MI, adrenal insufficiency, metastatic renal cell carcinoma (adrenal, bone, pulmonary s/p chemo, left nephrectomy and left adrenal resection 2018) currently in remission, PAD, right SFA stenting (10/2022), atherectomy and stenting of right SFA, popliteal and TP trunk (12/29/23).  The patient presented to [facility] on 04/01/2024 for a planned admission by [facility] for critical limb ischemia on heparin  gtt. after presenting to the office with 4-day history of sudden onset of right foot pain and coldness.  She was taken to the OR on 12/12 for angiogram and found to have thrombosis of the right SFA stents by Dr. Magda.  The patient underwent laser arthrectomy right peroneal, TP trunk and popliteal artery angioplasty with EKOS.     On 12/13 patient went to the OR for lysis check and underwent angioplasty to peroneal artery and stent placement of right SFA by Dr. Serene. Postoperatively the patient developed hematoma in the left groin with subsequent loss of doppler signals. Patient endorsed left groin and right calf pain. Due to concerns for compartment syndrome, patient returned to OR for a 4 compartment fasciotomy of the right leg, left iliofemoral  endarterectomy with pericardial patch angioplasty and left leg angiogram. Aspirin  and Plavix  initiated on 12/14.  Hospital course complicated by mild rhabdomyolysis and anemia that responded to IV fluid and PRBC transfusion. She completed 5 days therapy of antibiotics for UTI.     Prior to arrival the patient was active and independent in the community with use of DME.  She currently lives alone in a two-level house but able to live on the main level.  She currently requires min to mod assist with mobility and basic ADLs. Therapy evaluations completed due to patient decreased functional mobility was admitted for a comprehensive rehab program.      Inpatient Rehabilitation Course: KHYLI SWAIM was admitted to rehab 04/06/2024 for inpatient therapies to consist of PT, ST and OT at least three hours five days a week. Past admission physiatrist, therapy team and rehab RN have worked together to provide customized collaborative inpatient rehab.  Antiplatelet therapy: Continue Aspirin  and Plavix  indefinitely             2. Pain:  A) Diabetic peripheral neuropathy, chronic: As needed Tylenol , Voltaren , and oxycodone . 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 Discussed with patient getting MRI but she defers   3. Fungal infection of toes: continue antifungal cream. Follow up with podiatry outpatient.    4. Neuropsych: This patient is capable of making decisions on her own behalf.   5. Yeast infection of left groin incision: diflucan  started. Instructions provided.    6. RLE fasciotomy:  -Daily dressing changes to fasciotomies and L groin.  Linezolid  started for cellulitis Discussed with patient that vascular has said staples can be removed 3-4 weeks after her procedure, which was 12/13.    7. AKI on CKD: BUN/cr monitored with elevation required IVF  infusion, repeat labs showed improvement. Encourage oral hydration.    8. Critical limb ischemia: Continue DAPT indefinitely.  Follow-up with [facility] outpatient   9.  PAD: Continue aspirin . Statin being held d/t rhabdomyolysis.    10.  Hypotension: Continue to hold Norvasc  and Coreg . BP mildly elevated prior to discharge.  Follow up outpatient might need to resume meds.    11.   Chronic hx of Adrenalectomy 2018: On home Cortef  10 mg daily and 5 mg nightly.    12.  Hypothyroidism: Conitnue Synthroid  75 mcg.    13.  Severe vitamin D  deficiency, level was 8 on 12/15, increase D3 to 3,000U daily   14.  T2DM: A1c 10.3-monitor CBG AC/HS with SSI NovoLog , increase lantus  to 13 U BID.  Diabetes has been monitored with ac/hs CBG checks and SSI was use prn for tighter BS control. Diabetic teaching was completed.   -diabetic kit with lancets and test strips ordered at discharge.   15. Constipation:              -Decrease miralax  to daily, continue colace daily. Simethicone  prn. LBM 12/22, d/c prn milk of mag given kidney function, sorbitol  ordered 12/26 -04/17/24 LBM a few days ago per pt, doesn't want anything today, will take MOM at home.             16. Dysuria: UA not suggestive of bacterial infection, diflucan  started as above, Bactrim  d/ced   17. Bradycardia: continue holding coreg    18. Hyperkalemia: potassium reviewed and normalized   19. Right great toe erythema: vascular to doppler today     Planned Outpatient Follow-Up:  -PCP -PM&R -Podiatry -Vascular Surgery      Rehab course: During patient's stay in rehab weekly team conferences were held to monitor patient's progress, set goals and discuss barriers to discharge. At admission, patient required min to mod assist with mobility and basic ADLs.   Occupational Therapy: Patient has met 9 of 9 long term goals due to improved activity tolerance, improved balance, functional use of  RIGHT lower extremity, improved awareness, and  improved coordination.  Patient to discharge at overall Modified Independent level.  Patient's daughter will be staying with pt for a week, then pt is capable to care for self at the mod I level. Patient will benefit from ongoing skilled OT services in home health setting to continue to advance functional skills in the area of BADL and iADL.   Physical Therapy: Patient has met 10 of 10 long term goals due to improved activity tolerance, improved balance, improved postural control, increased strength, decreased pain, ability to compensate for deficits, improved attention, improved awareness, and improved coordination.  Patient to discharge at wheelchair level modified independent. She will benefit from ongoing skilled PT services in home health setting to continue to advance safe functional mobility, address ongoing impairments in transfers.  generalized strengthening and endurance, dynamic standing balance/coordination, ambulation, ROM, and to minimize fall risk.     Discharge plan was discussed with patient and/or family member and they verbalized understanding and agreed with it.      Disposition:  Discharge disposition: 01-Home or Self Care       Diet: Diabetic   Special Instructions:  -Left groin site: Clean with soap and water  and pat dry completely and keep dry gauze in crease to wick moisture.   -No driving or operating heavy machinery until cleared by provider  -No smoking or alcohol  or illicit drug use     Allergies as of  04/17/2024       Reactions   Ceftriaxone  Anaphylaxis   Vibramycin [doxycycline] Nausea And Vomiting   Adhesive [tape] Other (See Comments)   Very thin skin, tears easily OK to use paper tape   Cephalosporins Dermatitis   Tolerated cefazolin  and ceftriaxone     Cymbalta [duloxetine Hcl] Other (See Comments)   Dizziness  Woozy   Nsaids Other (See Comments)   Told to avoid, patient has 1 kidney   Statins Other (See Comments)   Liver/kidney issues.         Medication List     PAUSE taking these medications    amLODipine  5 MG tablet Wait to take this until your doctor or other care provider tells you to start again. Commonly known as: NORVASC  Take 5 mg by mouth daily.   repaglinide  0.5 MG tablet Wait to take this until your doctor or other care provider tells you to start again. Commonly known as: PRANDIN  Take 0.5 mg by mouth 2 (two) times daily.       STOP taking these medications    carvedilol  6.25 MG tablet Commonly known as: COREG    cefTRIAXone  1 g in sodium chloride  0.9 % 100 mL   heparin  5000 UNIT/ML injection   insulin  glargine 100 UNIT/ML injection Commonly known as: LANTUS  Replaced by: Lantus  SoloStar 100 UNIT/ML Solostar Pen   ondansetron  4 MG/2ML Soln injection Commonly known as: ZOFRAN    phenol 1.4 % Liqd Commonly known as: CHLORASEPTIC       TAKE these medications    acetaminophen  325 MG tablet Commonly known as: TYLENOL  Take 1-2 tablets (325-650 mg total) by mouth every 4 (four) hours as needed for mild pain (pain score 1-3) (or temp >/= 101 F).   Aspirin  EC Adult Low Dose 81 MG tablet Generic drug: aspirin  EC Take 1 tablet (81 mg total) by mouth daily. Swallow whole.   B-complex with vitamin C tablet Take 1 tablet by mouth daily.   Blood Glucose Monitoring Suppl Devi 1 each by Does not apply route as directed. Dispense based on patient and insurance preference. Use up to four times daily as directed. (FOR ICD-10 E10.9, E11.9).   clobetasol  ointment 0.05 % Commonly known as: TEMOVATE  Apply 1 application  topically 2 (two) times daily. lichen sclerosis   clopidogrel  75 MG tablet Commonly known as: PLAVIX  Take 75 mg by mouth daily.   diclofenac  Sodium 1 % Gel Commonly known as: VOLTAREN  Apply 2 g topically 4 (four) times daily as needed (for pain).   Gerhardt's butt cream Crea Apply 1 Application topically 2 (two) times daily.   hydrocortisone  5 MG tablet Commonly known as:  CORTEF  Take 1 tablet (5 mg total) by mouth at bedtime.   hydrocortisone  10 MG tablet Commonly known as: CORTEF  Take 1 tablet (10 mg total) by mouth daily.   insulin  aspart 100 UNIT/ML injection Commonly known as: novoLOG  Inject 0-9 Units into the skin every 4 (four) hours. What changed: Another medication with the same name was added. Make sure you understand how and when to take each.   Insulin  Aspart FlexPen 100 UNIT/ML Commonly known as: NOVOLOG  Inject 0-9 Units into the skin 4 (four) times daily -  before meals and at bedtime. What changed: You were already taking a medication with the same name, and this prescription was added. Make sure you understand how and when to take each.   ipratropium 17 MCG/ACT inhaler Commonly known as: ATROVENT  HFA Inhale 2 puffs into the lungs every 6 (six)  hours as needed for wheezing.   Lancet Device Misc 1 each by Does not apply route as directed. Dispense based on patient and insurance preference. Use up to four times daily as directed. (FOR ICD-10 E10.9, E11.9).   Lantus  SoloStar 100 UNIT/ML Solostar Pen Generic drug: insulin  glargine Inject 14 Units into the skin 2 (two) times daily. Replaces: insulin  glargine 100 UNIT/ML injection   levothyroxine  75 MCG tablet Commonly known as: SYNTHROID  Take 75 mcg by mouth daily before breakfast.   lidocaine  5 % Commonly known as: LIDODERM  Place 3 patches onto the skin daily. Remove & Discard patch within 12 hours or as directed by MD   linezolid  600 MG tablet Commonly known as: ZYVOX  Take 1 tablet (600 mg total) by mouth every 12 (twelve) hours for 3 days.   melatonin 3 MG Tabs tablet Take 1 tablet (3 mg total) by mouth at bedtime.   OneTouch Ultra Test test strip Generic drug: glucose blood Use as instructed   BLOOD GLUCOSE TEST STRIPS Strp 1 each by Does not apply route as directed. Dispense based on patient and insurance preference. Use up to four times daily as directed. (FOR ICD-10  E10.9, E11.9).   onetouch ultrasoft lancets USE 1 LANCET PER BLOOD SUGAR CHECK   Lancets Misc 1 each by Does not apply route as directed. Dispense based on patient and insurance preference. Use up to four times daily as directed. (FOR ICD-10 E10.9, E11.9).   Oxycodone  HCl 10 MG Tabs Take 1 tablet (10 mg total) by mouth every 4 (four) hours as needed. What changed: reasons to take this   polyethylene glycol powder 17 GM/SCOOP powder Commonly known as: GLYCOLAX /MIRALAX  Dissolve 1 capful (17g) in 4-8 ounces of liquid and take by mouth daily.   Stool Softener/Laxative 50-8.6 MG tablet Generic drug: senna-docusate Take 1 tablet by mouth daily with supper.   terbinafine  1 % cream Commonly known as: LAMISIL  Apply topically daily.   vitamin D3 25 MCG tablet Commonly known as: CHOLECALCIFEROL  Take 3 tablets (3,000 Units total) by mouth daily.        Follow-up Information     Loreda Hacker, DPM Follow up.   Specialty: Podiatry Why: Call for an appointment to evaluate ingrown toe nail. Contact information: [address] [place] [address] [phone]         Irven Ozell DEL, MD Follow up.   Specialty: Family Medicine Why: Call for an appointment within 1-2 weeks. Contact information: [address] [place] [address] [phone]         Lorilee Sven SQUIBB, MD Follow up.   Specialty: Physical Medicine and Rehabilitation Why: Office will call for appointment Contact information: [address] [address] [place] [address] [phone]                 Signed: Daphne LOISE Satterfield 04/16/2024, 11:42 AM

## 2024-04-16 NOTE — Progress Notes (Addendum)
 "                                                        PROGRESS NOTE   Subjective/Complaints: Right great toe erythema improved, appreciate vascular assessing with Doppler today Discussed repeating BMP and CBC today  ROS: +fungal infection of toes, +diabetic peripheral neuropathy, +pain posterior to right knee, +dysuria- continues, +erythema of right lower extremity incision, +erythema of right great toe   Objective:   No results found.  Recent Labs    04/14/24 0110  WBC 11.2*  HGB 8.5*  HCT 27.1*  PLT 357   Recent Labs    04/14/24 1609  NA 131*  K 5.4*  CL 102  CO2 20*  GLUCOSE 209*  BUN 41*  CREATININE 2.17*  CALCIUM  8.9     Intake/Output Summary (Last 24 hours) at 04/16/2024 0908 Last data filed at 04/16/2024 0700 Gross per 24 hour  Intake 1345.09 ml  Output --  Net 1345.09 ml     Wound 04/06/24 1500 Pressure Injury Heel Right Deep Tissue Pressure Injury - Purple or maroon localized area of discolored intact skin or blood-filled blister due to damage of underlying soft tissue from pressure and/or shear. (Active)     Wound 04/06/24 1500 Pressure Injury Heel Left;Lateral Deep Tissue Pressure Injury - Purple or maroon localized area of discolored intact skin or blood-filled blister due to damage of underlying soft tissue from pressure and/or shear. (Active)     Wound 04/06/24 1500 Pressure Injury Coccyx Deep Tissue Pressure Injury - Purple or maroon localized area of discolored intact skin or blood-filled blister due to damage of underlying soft tissue from pressure and/or shear. (Active)    Physical Exam: Vital Signs Blood pressure (!) 129/47, pulse (!) 56, temperature 97.6 F (36.4 C), temperature source Oral, resp. rate 16, height 4' 10 (1.473 m), weight 56.5 kg, SpO2 100%.  Gen: no distress, normal appearing HEENT: oral mucosa pink and moist, NCAT Cardio: Bradycardia Chest: normal effort, normal rate of breathing Abd: soft, non-distended Ext: no  edema Psych: pleasant, normal affect Skin: fungal infection of toenails, right calf incision is erythematous, has associated edema, yeast infection of left groin incision, erythema to right great toe- improved Neuro: Alert and oriented x3, 4/5 strength in RLE proximally and 3/5 strength distally, strength is otherwise 5/5 with the exception of 0/5 big toe extension on left foot, decreased sensation in bilateral feet, stable 12/26 MSK: TTP posterior to right knee with mild edema   Assessment/Plan: 1. Functional deficits which require 3+ hours per day of interdisciplinary therapy in a comprehensive inpatient rehab setting. Physiatrist is providing close team supervision and 24 hour management of active medical problems listed below. Physiatrist and rehab team continue to assess barriers to discharge/monitor patient progress toward functional and medical goals  Care Tool:  Bathing    Body parts bathed by patient: Right arm, Left arm, Chest, Abdomen, Front perineal area, Buttocks, Right upper leg, Left upper leg, Face, Right lower leg, Left lower leg     Body parts n/a: Right lower leg, Left lower leg   Bathing assist Assist Level: Independent with assistive device     Upper Body Dressing/Undressing Upper body dressing   What is the patient wearing?: Pull over shirt    Upper body assist Assist Level: Independent  Lower Body Dressing/Undressing Lower body dressing      What is the patient wearing?: Underwear/pull up, Pants     Lower body assist Assist for lower body dressing: Independent with assitive device Assistive Device Comment: reacher   Toileting Toileting    Toileting assist Assist for toileting: Independent with assistive device     Transfers Chair/bed transfer  Transfers assist     Chair/bed transfer assist level: Independent with assistive device Chair/bed transfer assistive device: Geologist, Engineering   Ambulation assist   Ambulation  activity did not occur: Safety/medical concerns (pain, weakness, decreased balance)  Assist level: Independent with assistive device Assistive device: Walker-rolling Max distance: 178ft   Walk 10 feet activity   Assist  Walk 10 feet activity did not occur: Safety/medical concerns (pain, weakness, decreased balance)  Assist level: Independent with assistive device Assistive device: Walker-rolling   Walk 50 feet activity   Assist Walk 50 feet with 2 turns activity did not occur: Safety/medical concerns (pain, weakness, decreased balance)  Assist level: Independent with assistive device Assistive device: Walker-rolling    Walk 150 feet activity   Assist Walk 150 feet activity did not occur: Safety/medical concerns (pain, weakness, decreased balance)  Assist level: Independent with assistive device Assistive device: Walker-rolling    Walk 10 feet on uneven surface  activity   Assist Walk 10 feet on uneven surfaces activity did not occur: Safety/medical concerns (pain, weakness, decreased balance)   Assist level: Supervision/Verbal cueing Assistive device: Walker-rolling   Wheelchair     Assist Is the patient using a wheelchair?: Yes Type of Wheelchair: Manual Wheelchair activity did not occur: Safety/medical concerns (pain, weakness)  Wheelchair assist level: Independent Max wheelchair distance: 118ft    Wheelchair 50 feet with 2 turns activity    Assist    Wheelchair 50 feet with 2 turns activity did not occur: Safety/medical concerns (pain, weakness)   Assist Level: Independent   Wheelchair 150 feet activity     Assist  Wheelchair 150 feet activity did not occur: Safety/medical concerns (pain, weakness)   Assist Level: Independent   Blood pressure (!) 129/47, pulse (!) 56, temperature 97.6 F (36.4 C), temperature source Oral, resp. rate 16, height 4' 10 (1.473 m), weight 56.5 kg, SpO2 100%.   Medical Problem List and Plan: 1.  Functional deficits secondary to Debility due to RLE ischemia s/p fasciotomy x4 with wounds on RLE             -patient may  shower if covers incisions             -ELOS/Goals: ~ 10-14 days- supervision to intermittent mod I             Chart and therapy notes reviewed, continue CIR, discussed patient's progress with therapy Grounds pass ordered Vitamin D3/Metanx/Vitamin B/C complex ordered F/u with me or Fidela in clinic within 1 month Would benefit from home health aide upon discharge   -Heparin  decreased to q12H 2/2 anemia, Hgb has now improved to 10.3, continue this dose             -antiplatelet therapy: Aspirin  and Plavix    This patient is capable of making decisions on her own behalf.   2. Pain:  A) Diabetic peripheral neuropathy, chronic: As needed Tylenol , Voltaren , and oxycodone . 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 Discussed with patient getting MRI but she  defers   3. Fungal infection of toes: antifungal cream added   5. Yeast infection of left groin incision: diflucan  started   6. RLE fasciotomy:  -Daily dressing changes to fasciotomies and L groin.  Linezolid  started for cellulitis Discussed with patient that vascular has said staples can be removed 3-4 weeks after her procedure, which was 12/13  7. AKI on CKD: Cr reviewed and improved, encouraged oral hydration   8. Critical limb ischemia: Continue DAPT indefinitely.  Follow-up with [facility] outpatient   9.  PAD: Continue aspirin . Statin being held d/t rhabdomyolysis.    10.  Hypotension: d/c norvasc , d/c coreg    11.   Chronic hx of Adrenalectomy 2018: On home Cortef  10 mg daily and 5 mg nightly.    12.  Hypothyroidism: Conitnue Synthroid  75 mcg.    13.  Severe vitamin D  deficiency, level was 8 on 12/15, increase D3 to 3,000U daily   14.  T2DM: A1c 10.3-monitor CBG  AC/HS with SSI NovoLog , increase lantus  to 13 U BID   15. Constipation:              -Decrease miralax  to daily, continue colace daily. Simethicone  prn. LBM 12/22, d/c prn milk of mag given kidney function, sorbitol  ordered 12/26  16. Dysuria: UA not suggestive of bacterial infection, diflucan  started as above, Bactrim  d/ced  17. Bradycardia: d/c coreg   18. Hyperkalemia: potassium reviewed and normalized  19. Right great toe erythema: vascular to doppler today  LOS: 10 days A FACE TO FACE EVALUATION WAS PERFORMED  Kaheem Halleck P Layani Foronda 04/16/2024, 9:08 AM     "

## 2024-04-16 NOTE — Progress Notes (Addendum)
 " Progress Note    04/16/2024 7:40 AM   Subjective:  says she is still tender in the groin   afebrile  Vitals:   04/15/24 1948 04/16/24 0600  BP: (!) 135/43 (!) 129/47  Pulse: 61 (!) 56  Resp: 16 16  Temp: 98.5 F (36.9 C) 97.6 F (36.4 C)  SpO2: 99% 100%    Physical Exam: General:  no distress Lungs:  non labored Incisions:  left groin with staples in tact; still with erythema but appears improved.  Right lower leg incisions are clean with staples in tact.    CBC    Component Value Date/Time   WBC 11.2 (H) 04/14/2024 0110   RBC 2.77 (L) 04/14/2024 0110   HGB 8.5 (L) 04/14/2024 0110   HGB 12.6 12/03/2023 1359   HGB 11.2 (L) 04/04/2017 0830   HCT 27.1 (L) 04/14/2024 0110   HCT 35.2 04/04/2017 0830   PLT 357 04/14/2024 0110   PLT 233 12/03/2023 1359   PLT 250 04/04/2017 0830   MCV 97.8 04/14/2024 0110   MCV 107.6 (H) 04/04/2017 0830   MCH 30.7 04/14/2024 0110   MCHC 31.4 04/14/2024 0110   RDW 15.4 04/14/2024 0110   RDW 14.0 04/04/2017 0830   LYMPHSABS 1.3 04/14/2024 0110   LYMPHSABS 1.6 04/04/2017 0830   MONOABS 1.2 (H) 04/14/2024 0110   MONOABS 0.4 04/04/2017 0830   EOSABS 0.2 04/14/2024 0110   EOSABS 0.1 04/04/2017 0830   BASOSABS 0.1 04/14/2024 0110   BASOSABS 0.0 04/04/2017 0830    BMET    Component Value Date/Time   NA 131 (L) 04/14/2024 1609   NA 140 04/04/2017 0830   K 5.4 (H) 04/14/2024 1609   K 4.1 04/04/2017 0830   CL 102 04/14/2024 1609   CO2 20 (L) 04/14/2024 1609   CO2 21 (L) 04/04/2017 0830   GLUCOSE 209 (H) 04/14/2024 1609   GLUCOSE 107 04/04/2017 0830   BUN 41 (H) 04/14/2024 1609   BUN 37.0 (H) 04/04/2017 0830   CREATININE 2.17 (H) 04/14/2024 1609   CREATININE 1.67 (H) 12/03/2023 1359   CREATININE 2.2 (H) 04/04/2017 0830   CALCIUM  8.9 04/14/2024 1609   CALCIUM  10.1 04/04/2017 0830   GFRNONAA 22 (L) 04/14/2024 1609   GFRNONAA 31 (L) 12/03/2023 1359   GFRAA 30 (L) 01/19/2020 0835    INR    Component Value Date/Time   INR  1.1 09/01/2018 1850     Intake/Output Summary (Last 24 hours) at 04/16/2024 0740 Last data filed at 04/16/2024 0700 Gross per 24 hour  Intake 1585.09 ml  Output --  Net 1585.09 ml      Assessment/Plan:  83 y.o. female is s/p:  Angiogram with right peroneal, TPT, popliteal artery angioplasty, thrombolysis 04/02/2024, angiogram with right peroneal artery and TPT angioplasty, stent right SFA 04/03/2024, left iliofemoral endarterectomy with bovine patch angioplasty, 4 compartment fasciotomy RLE 04/03/2024    -still with some erythema in the groin but improved.  Malodor improved.  Clean with soap and water  and pat dry completely and keep dry gauze in crease to wick moisture.  -having right great toe pain-will come back in a bit to check doppler flow in feet.   Lucie Apt, PA-C [facility] [phone] 04/16/2024 7:40 AM  I agree with the above.  Have seen and evaluated the patient.  She continues to complain of right great toe pain which is unchanged from prior to admission.  She does have some erythema along the medial fasciotomy closure site.  She should continue with oral antibiotics at discharge.  She will return for staple removal in 1 to 2 weeks  Wells [employer]ji Pertuit  "

## 2024-04-16 NOTE — Progress Notes (Signed)
 Physical Therapy Session Note  Patient Details  Name: Audrey [name] MRN: [id number] Date of Birth: February 22, 1941  Today's Date: 04/16/2024 PT Individual Time: 0925-1020 and 1045-1200 PT Individual Time Calculation (min): 55 min and 75 min  Short Term Goals: Week 1:  PT Short Term Goal 1 (Week 1): pt will perform bed<>chair transfers with supervision PT Short Term Goal 2 (Week 1): pt will ambulate 59ft with LRAD and min A PT Short Term Goal 3 (Week 1): pt will transfer sit<>stand with LRAD and CGA  Skilled Therapeutic Interventions/Progress Updates: Pt presented in w/c agreeable to therapy. Pt c/o pain in R foot particularly great toe. Rest and elevation provided as needed. Pt donned shoes mod I and completed ambulatory transfer with RW and PTA managing IV pole to bathroom. Pt mod I with toileting tasks after continent urinary void. Pt then ambulated to day room with RW and PTA managing IV pole. Pt then participated in standing activity playing horseshoes while standing on Airex. Pt also participated in static stand without AD on Airex x 1 min initially requiring minA fading to CGA. Completed ambulatory transfer to w/c and transported back to room, pt left in w/c at end of session with call bell within reach and needs met.   Tx2: Pt presented in recliner agreeable to therapy. Pt states unrated pain in RLE/great toe. Rest and elevation provided during session. Pt transported to main gym for time management. PTA provided HEP as noted below. Pt ambulated to parallel bars and was able to participate in all exercises minimum x 10. PTA also provided walking program sheet and encouraged pt to continue to mobilize after d/c as pt indicated more sedentary lifestyle prior to hospitalization. Pt also required additional instruction in hamstring stretching to allow for effective stretch. PTA also providing education on safe mobility upon d/c with pt indicating dgt will be home with her the first week. Pt then  ambulated back to room with RW and mod I with PTA providing assistance for IV pole management. Upon entry to room pt requesting to use bathroom with pt completed toilet transfers with peri-care mod I. In room PTA discussed mod I in room when not attached to IV pole. Pt verbalized understanding. Pt returned to w/c at end of session and pt left with call bell within reach and needs met.   Access Code: [email or web address] URL: [email or web address] Date: 04/16/2024 Prepared by: Margeret Ameris Akamine  Exercises - Seated Long Arc Quad  - 1 x daily - 7 x weekly - 3 sets - 10 reps - Standing Hip Abduction with Counter Support  - 1 x daily - 7 x weekly - 3 sets - 10 reps - Mini Squat with Counter Support  - 1 x daily - 7 x weekly - 3 sets - 10 reps - Standing Knee Flexion with Counter Support  - 1 x daily - 7 x weekly - 3 sets - 10 reps - Standing Hip Extension with Unilateral Counter Support  - 1 x daily - 7 x weekly - 3 sets - 10 reps - Seated Hamstring Stretch  - 1 x daily - 7 x weekly - 1 sets - 3 reps - 30 sec hold     Therapy Documentation Precautions:  Precautions Precautions: Fall Recall of Precautions/Restrictions: Intact Restrictions Weight Bearing Restrictions Per Provider Order: No RLE Weight Bearing Per Provider Order: Weight bearing as tolerated General:   Vital Signs: Therapy Vitals Temp: 98 F (36.7 C) Pulse Rate: (!) 57 Resp: 16 BP: 135/65 Patient  Position (if appropriate): Lying Oxygen Therapy SpO2: 100 % O2 Device: Room Air Pain: Pain Assessment Pain Scale: 0-10 Pain Score: 3  Pain Type: Neuropathic pain Pain Location: Toe (Comment which one) (Right) Pain Orientation: Right;Left Pain Descriptors / Indicators: Aching Pain Frequency: Constant Pain Onset: On-going Patients Stated Pain Goal: 0 Pain Intervention(s): Medication (See eMAR)    Therapy/Group: Individual Therapy  Kye Silverstein 04/16/2024, 4:10 PM

## 2024-04-17 ENCOUNTER — Other Ambulatory Visit: Payer: Self-pay | Admitting: Student

## 2024-04-17 ENCOUNTER — Encounter: Payer: Self-pay | Admitting: Physical Medicine and Rehabilitation

## 2024-04-17 ENCOUNTER — Encounter: Payer: Self-pay | Admitting: Physician Assistant

## 2024-04-17 DIAGNOSIS — R739 Hyperglycemia, unspecified: Secondary | ICD-10-CM

## 2024-04-17 DIAGNOSIS — K5901 Slow transit constipation: Secondary | ICD-10-CM

## 2024-04-17 LAB — GLUCOSE, CAPILLARY: Glucose-Capillary: 81 mg/dL (ref 70–99)

## 2024-04-17 NOTE — Progress Notes (Signed)
 "                                                        PROGRESS NOTE   Subjective/Complaints:  Pt doing well, slept ok, pain well managed, LBM a few days ago but has a plan to take MoM when she gets home-- does NOT want to do anything before she goes, has a long car ride home.  Urinating fine. Ready to go home! No other complaints or concerns.   ROS: +fungal infection of toes, +diabetic peripheral neuropathy, +pain posterior to right knee, +dysuria- continues, +erythema of right lower extremity incision, +erythema of right great toe   Objective:   No results found.  Recent Labs    04/16/24 1402  WBC 9.3  HGB 9.1*  HCT 29.7*  PLT 406*   Recent Labs    04/14/24 1609 04/16/24 1402  NA 131* 134*  K 5.4* 5.1  CL 102 107  CO2 20* 17*  GLUCOSE 209* 255*  BUN 41* 39*  CREATININE 2.17* 1.89*  CALCIUM  8.9 8.8*     Intake/Output Summary (Last 24 hours) at 04/17/2024 1010 Last data filed at 04/17/2024 0739 Gross per 24 hour  Intake 700 ml  Output --  Net 700 ml     Wound 04/06/24 1500 Pressure Injury Heel Right Deep Tissue Pressure Injury - Purple or maroon localized area of discolored intact skin or blood-filled blister due to damage of underlying soft tissue from pressure and/or shear. (Active)     Wound 04/06/24 1500 Pressure Injury Heel Left;Lateral Deep Tissue Pressure Injury - Purple or maroon localized area of discolored intact skin or blood-filled blister due to damage of underlying soft tissue from pressure and/or shear. (Active)     Wound 04/06/24 1500 Pressure Injury Coccyx Deep Tissue Pressure Injury - Purple or maroon localized area of discolored intact skin or blood-filled blister due to damage of underlying soft tissue from pressure and/or shear. (Active)    Physical Exam: Vital Signs Blood pressure (!) 153/72, pulse 63, temperature 97.9 F (36.6 C), temperature source Oral, resp. rate 17, height 4' 10 (1.473 m), weight 56.5 kg, SpO2 100%.  Gen: no  distress, normal appearing, sitting at EOB HEENT: oral mucosa pink and moist, NCAT Cardio: RRR, no m/r/g appreciated Chest: normal effort, normal rate of breathing, CTAB Abd: soft, non-distended, nontender, +BS throughout Ext: slight edema in RLE, incisions c/d/I without surrounding cellulitic changes Psych: pleasant, normal affect  PRIOR EXAMS: Skin: fungal infection of toenails, right calf incision is erythematous, has associated edema, yeast infection of left groin incision, erythema to right great toe- improved Neuro: Alert and oriented x3, 4/5 strength in RLE proximally and 3/5 strength distally, strength is otherwise 5/5 with the exception of 0/5 big toe extension on left foot, decreased sensation in bilateral feet, stable 12/26 MSK: TTP posterior to right knee with mild edema   Assessment/Plan: 1. Functional deficits which require 3+ hours per day of interdisciplinary therapy in a comprehensive inpatient rehab setting. Physiatrist is providing close team supervision and 24 hour management of active medical problems listed below. Physiatrist and rehab team continue to assess barriers to discharge/monitor patient progress toward functional and medical goals  Care Tool:  Bathing    Body parts bathed by patient: Right arm, Left arm, Chest, Abdomen, Front perineal area, Buttocks, Right  upper leg, Left upper leg, Face, Right lower leg, Left lower leg     Body parts n/a: Right lower leg, Left lower leg   Bathing assist Assist Level: Independent with assistive device     Upper Body Dressing/Undressing Upper body dressing   What is the patient wearing?: Pull over shirt    Upper body assist Assist Level: Independent    Lower Body Dressing/Undressing Lower body dressing      What is the patient wearing?: Underwear/pull up, Pants     Lower body assist Assist for lower body dressing: Independent with assitive device Assistive Device Comment: reacher   Toileting Toileting     Toileting assist Assist for toileting: Independent with assistive device     Transfers Chair/bed transfer  Transfers assist     Chair/bed transfer assist level: Independent with assistive device Chair/bed transfer assistive device: Geologist, Engineering   Ambulation assist   Ambulation activity did not occur: Safety/medical concerns (pain, weakness, decreased balance)  Assist level: Independent with assistive device Assistive device: Walker-rolling Max distance: 117ft   Walk 10 feet activity   Assist  Walk 10 feet activity did not occur: Safety/medical concerns (pain, weakness, decreased balance)  Assist level: Independent with assistive device Assistive device: Walker-rolling   Walk 50 feet activity   Assist Walk 50 feet with 2 turns activity did not occur: Safety/medical concerns (pain, weakness, decreased balance)  Assist level: Independent with assistive device Assistive device: Walker-rolling    Walk 150 feet activity   Assist Walk 150 feet activity did not occur: Safety/medical concerns (pain, weakness, decreased balance)  Assist level: Independent with assistive device Assistive device: Walker-rolling    Walk 10 feet on uneven surface  activity   Assist Walk 10 feet on uneven surfaces activity did not occur: Safety/medical concerns (pain, weakness, decreased balance)   Assist level: Supervision/Verbal cueing Assistive device: Walker-rolling   Wheelchair     Assist Is the patient using a wheelchair?: Yes Type of Wheelchair: Manual Wheelchair activity did not occur: Safety/medical concerns (pain, weakness)  Wheelchair assist level: Independent Max wheelchair distance: 128ft    Wheelchair 50 feet with 2 turns activity    Assist    Wheelchair 50 feet with 2 turns activity did not occur: Safety/medical concerns (pain, weakness)   Assist Level: Independent   Wheelchair 150 feet activity     Assist  Wheelchair 150  feet activity did not occur: Safety/medical concerns (pain, weakness)   Assist Level: Independent   Blood pressure (!) 153/72, pulse 63, temperature 97.9 F (36.6 C), temperature source Oral, resp. rate 17, height 4' 10 (1.473 m), weight 56.5 kg, SpO2 100%.   Medical Problem List and Plan: 1. Functional deficits secondary to Debility due to RLE ischemia s/p fasciotomy x4 with wounds on RLE             -patient may  shower if covers incisions             -ELOS/Goals: ~ 10-14 days- supervision to intermittent mod I             Chart and therapy notes reviewed, continue CIR, discussed patient's progress with therapy Grounds pass ordered Vitamin D3/Metanx/Vitamin B/C complex ordered F/u with me or Fidela in clinic within 1 month Would benefit from home health aide upon discharge   -Heparin  decreased to q12H 2/2 anemia, Hgb has now improved to 10.3, continue this dose             -antiplatelet therapy:  Aspirin  and Plavix    -04/17/24 d/c home today, paperwork reviewed, questions answered   2. Pain:  A) Diabetic peripheral neuropathy, chronic: As needed Tylenol , Voltaren , and oxycodone . 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 Discussed with patient getting MRI but she defers   3. Fungal infection of toes: antifungal cream added  4. Neuropsych: This patient is capable of making decisions on her own behalf.   5. Yeast infection of left groin incision: diflucan  started   6. RLE fasciotomy:  -Daily dressing changes to fasciotomies and L groin.  Linezolid  started for cellulitis Discussed with patient that vascular has said staples can be removed 3-4 weeks after her procedure, which was 12/13  7. AKI on CKD: Cr reviewed and improved, encouraged oral hydration   8. Critical limb ischemia: Continue DAPT indefinitely.  Follow-up with [facility]  outpatient   9.  PAD: Continue aspirin . Statin being held d/t rhabdomyolysis.    10.  Hypotension: d/c norvasc , d/c coreg   -04/17/24 BPs a bit elevated, f/up outpatient might need to resume meds   11.   Chronic hx of Adrenalectomy 2018: On home Cortef  10 mg daily and 5 mg nightly.    12.  Hypothyroidism: Conitnue Synthroid  75 mcg.    13.  Severe vitamin D  deficiency, level was 8 on 12/15, increase D3 to 3,000U daily   14.  T2DM: A1c 10.3-monitor CBG AC/HS with SSI NovoLog , increase lantus  to 13 U BID  -04/17/24 CBGs variable, will need to f/up for further control CBG (last 3)  Recent Labs    04/16/24 1637 04/16/24 2001 04/17/24 0544  GLUCAP 147* 256* 81    15. Constipation:              -Decrease miralax  to daily, continue colace daily. Simethicone  prn. LBM 12/22, d/c prn milk of mag given kidney function, sorbitol  ordered 12/26 -04/17/24 LBM a few days ago per pt, doesn't want anything today, will take MOM at home.    16. Dysuria: UA not suggestive of bacterial infection, diflucan  started as above, Bactrim  d/ced  17. Bradycardia: d/c coreg   18. Hyperkalemia: potassium reviewed and normalized  19. Right great toe erythema: vascular to doppler today   LOS: 11 days A FACE TO FACE EVALUATION WAS PERFORMED  [address] 04/17/2024, 10:10 AM     "

## 2024-04-17 NOTE — Progress Notes (Signed)
 Received discharge instructions from PA and meds from TOC .Patient discharged via private car with son.

## 2024-04-19 ENCOUNTER — Telehealth: Payer: Self-pay

## 2024-04-19 NOTE — Telephone Encounter (Signed)
 Called pt to offer 2 week incision check on 04/23/24. Pt states she doesn't have anyone to bring her to the appt. MP asked if she had family that's going to bring her the 1/14 appt, and she said a friend is going to bring her (pt has 2 sons that work full time), and wants to just come on 05/05/24. MP told pt that if the incisions looked like they are starting to get infected to call our office immediately. Pt understood. Not scheduling 1-2 week check at this time.

## 2024-04-19 NOTE — Progress Notes (Signed)
 Inpatient Rehabilitation Care Coordinator Discharge Note   Patient Details  Name: Audrey [name] MRN: [id number] Date of Birth: 12-28-1940   Discharge location: Home alone with family support  Length of Stay: 10 days  Discharge activity level: Independent with assistive device  Home/community participation: Limited activity in the community  Patient response un:Yzjouy Literacy - How often do you need to have someone help you when you read instructions, pamphlets, or other written material from your doctor or pharmacy?: Never  Patient response un:Dnrpjo Isolation - How often do you feel lonely or isolated from those around you?: Never  Services provided included: MD, RD, PT, OT, SLP, RN, CM, TR, Pharmacy, SW  Financial Services:  Financial Services Utilized: Medicare    Choices offered to/list presented to: Patient  Follow-up services arranged:  Home Health, DME Home Health Agency: [facility]    DME : [facility] - [facility]    Patient response to transportation need: Is the patient able to respond to transportation needs?: Yes In the past 12 months, has lack of transportation kept you from medical appointments or from getting medications?: No In the past 12 months, has lack of transportation kept you from meetings, work, or from getting things needed for daily living?: No   Patient/Family verbalized understanding of follow-up arrangements:  Yes  Individual responsible for coordination of the follow-up plan: Patient  Confirmed correct DME delivered: [employer]  Loreli 04/19/2024    Comments (or additional information): Patient discharge home alone with children assisting as needed.   Summary of Stay    Date/Time Discharge Planning CSW  04/13/24 1036 Plans to discharge home alone with support from family and friends. Family education recommended - awaiting response from family. Will await further therapy follow-up recommendations. DS  04/07/24 1056 TBA AAC        [employer]  Loreli

## 2024-04-20 DIAGNOSIS — L03031 Cellulitis of right toe: Secondary | ICD-10-CM | POA: Insufficient documentation

## 2024-04-20 DIAGNOSIS — C801 Malignant (primary) neoplasm, unspecified: Secondary | ICD-10-CM | POA: Insufficient documentation

## 2024-04-20 DIAGNOSIS — I739 Peripheral vascular disease, unspecified: Secondary | ICD-10-CM | POA: Insufficient documentation

## 2024-04-20 DIAGNOSIS — E1140 Type 2 diabetes mellitus with diabetic neuropathy, unspecified: Secondary | ICD-10-CM | POA: Insufficient documentation

## 2024-04-20 DIAGNOSIS — E663 Overweight: Secondary | ICD-10-CM | POA: Diagnosis present

## 2024-04-20 DIAGNOSIS — N186 End stage renal disease: Secondary | ICD-10-CM | POA: Insufficient documentation

## 2024-04-20 DIAGNOSIS — M25561 Pain in right knee: Secondary | ICD-10-CM | POA: Diagnosis present

## 2024-04-20 DIAGNOSIS — M5416 Radiculopathy, lumbar region: Secondary | ICD-10-CM | POA: Insufficient documentation

## 2024-04-20 DIAGNOSIS — M810 Age-related osteoporosis without current pathological fracture: Secondary | ICD-10-CM | POA: Insufficient documentation

## 2024-04-20 DIAGNOSIS — N1832 Chronic kidney disease, stage 3b: Secondary | ICD-10-CM | POA: Diagnosis present

## 2024-04-20 DIAGNOSIS — E1165 Type 2 diabetes mellitus with hyperglycemia: Secondary | ICD-10-CM | POA: Insufficient documentation

## 2024-04-20 DIAGNOSIS — M545 Low back pain, unspecified: Secondary | ICD-10-CM | POA: Insufficient documentation

## 2024-04-23 ENCOUNTER — Encounter (HOSPITAL_COMMUNITY)

## 2024-04-28 ENCOUNTER — Other Ambulatory Visit (HOSPITAL_COMMUNITY): Payer: Self-pay

## 2024-04-30 NOTE — Progress Notes (Signed)
 Patient ID: Audrey [name], female   DOB: 18-Oct-1940, 84 y.o.   MRN: [id number]  Faxed over all information that was asked for [facility] referral. Spoke to Macario Comer who confirmed that she received all information.

## 2024-05-03 ENCOUNTER — Other Ambulatory Visit: Payer: Self-pay | Admitting: Hematology

## 2024-05-05 ENCOUNTER — Inpatient Hospital Stay: Admitting: Hematology

## 2024-05-05 ENCOUNTER — Encounter: Payer: Self-pay | Admitting: Physician Assistant

## 2024-05-05 ENCOUNTER — Inpatient Hospital Stay

## 2024-05-05 ENCOUNTER — Ambulatory Visit (HOSPITAL_COMMUNITY)
Admission: RE | Admit: 2024-05-05 | Discharge: 2024-05-05 | Disposition: A | Discharge status: Home / Self Care | Source: Ambulatory Visit | Attending: Physician Assistant | Admitting: Physician Assistant

## 2024-05-05 ENCOUNTER — Ambulatory Visit (HOSPITAL_BASED_OUTPATIENT_CLINIC_OR_DEPARTMENT_OTHER)
Admission: RE | Admit: 2024-05-05 | Discharge: 2024-05-05 | Disposition: A | Discharge status: Home / Self Care | Source: Ambulatory Visit | Attending: Physician Assistant | Admitting: Physician Assistant

## 2024-05-05 ENCOUNTER — Ambulatory Visit (INDEPENDENT_AMBULATORY_CARE_PROVIDER_SITE_OTHER): Admitting: Physician Assistant

## 2024-05-05 VITALS — BP 145/82 | HR 80 | Temp 98.0°F | Wt 122.2 lb

## 2024-05-05 DIAGNOSIS — I70229 Atherosclerosis of native arteries of extremities with rest pain, unspecified extremity: Secondary | ICD-10-CM | POA: Diagnosis present

## 2024-05-05 DIAGNOSIS — I70222 Atherosclerosis of native arteries of extremities with rest pain, left leg: Secondary | ICD-10-CM | POA: Insufficient documentation

## 2024-05-05 DIAGNOSIS — I70221 Atherosclerosis of native arteries of extremities with rest pain, right leg: Secondary | ICD-10-CM | POA: Insufficient documentation

## 2024-05-05 DIAGNOSIS — I739 Peripheral vascular disease, unspecified: Secondary | ICD-10-CM | POA: Diagnosis present

## 2024-05-05 LAB — VAS US ABI WITH/WO TBI
Left ABI: 0.3
Right ABI: 0.83

## 2024-05-05 NOTE — Progress Notes (Signed)
 " Office Note     CC:  follow up Requesting Provider:  Irven Ozell DEL, MD  HPI: Audrey [name] is a 84 y.o. (11/30/1940) female who presents for follow up of PAD. She recently underwent Angiogram with Right peroneal, TP trunk, popliteal artery angioplasty (3x144mm Sterling) and catheter directed thrombolysis (EKOS) on 04/02/24 by Dr. Magda. This was followed by lysis catheter recheck by Dr. Serene on 04/03/24 with Angioplasty of the right peroneal artery and tibioperoneal trunk as well as stenting of the right superficial femoral artery. This was performed secondary to rest pain with occlusion of previously placed SFA stent. Unfortunately  post procedure she developed compartment syndrome of her right leg with acute occlusion of her left common femoral and had to undergo 1#: 4 compartment fasciotomy, right leg #2: Left iliofemoral endarterectomy with bovine pericardial patch angioplasty #3: Left leg angiogram and #4: Placement of first 38 cm Kerecis skin substitute by Dr. Serene. She had prolonged hospital course following the above and was discharged to [facility] and ultimately discharged home on 04/16/24.  Today she is here for staple removal and non invasive studies. She reports overall the right leg feels better. She denies any pain in  her left foot or leg. She does however have pain in her left groin in area of incision. She has also noticed a fullness in her proximal left thigh since her surgery. She says she has history of hydradenitis and thought maybe that's what it was. She otherwise has been ambulating with her cane. She has been washing her incisions daily and changing her left groin gauze multiple times a day to keep it clean. She is compliant with her  Aspirin , statin and plavix .   Past Medical History:  Diagnosis Date   Anemia    Arthritis    lower back, hips, hands   Biliary stricture (HCC)    Diabetes mellitus (HCC)    type 2    Early cataracts, bilateral    Md just watching    Elevated liver enzymes    Family history of adverse reaction to anesthesia    Daughter hard to wake up   Gallstones    GERD (gastroesophageal reflux disease)    occasional - diet controlled   History of blood transfusion 2018   History of hiatal hernia    HTN (hypertension)    Hyperlipidemia    Hypothyroidism    left renal ca dx'd 2018   renal cancer - left kidney removed, pill chemo x 1 yr   Myocardial infarction (HCC) 1991   no deficits   Peripheral vascular disease    PONV (postoperative nausea and vomiting)    SVD (spontaneous vaginal delivery)    x 3   Wears glasses     Past Surgical History:  Procedure Laterality Date   ABDOMINAL AORTOGRAM N/A 12/29/2023   Procedure: ABDOMINAL AORTOGRAM;  Surgeon: Sheree Penne Bruckner, MD;  Location: [facility] INVASIVE CV LAB;  Service: Cardiovascular;  Laterality: N/A;   ABDOMINAL AORTOGRAM W/LOWER EXTREMITY N/A 10/21/2022   Procedure: ABDOMINAL AORTOGRAM W/LOWER EXTREMITY;  Surgeon: Sheree Penne Bruckner, MD;  Location: [facility] INVASIVE CV LAB;  Service: Cardiovascular;  Laterality: N/A;   ABDOMINAL AORTOGRAM W/LOWER EXTREMITY N/A 04/02/2024   Procedure: ABDOMINAL AORTOGRAM W/LOWER EXTREMITY;  Surgeon: Magda Debby SAILOR, MD;  Location: [facility] INVASIVE CV LAB;  Service: Cardiovascular;  Laterality: N/A;   BALLOON DILATION N/A 07/23/2018   Procedure: BALLOON DILATION;  Surgeon: Wilhelmenia Aloha Raddle., MD;  Location: [facility] ENDOSCOPY;  Service: Gastroenterology;  Laterality: N/A;   BILIARY BRUSHING  08/10/2018   Procedure: BILIARY BRUSHING;  Surgeon: Wilhelmenia Aloha Raddle., MD;  Location: [facility] ENDOSCOPY;  Service: Gastroenterology;;   BILIARY BRUSHING  11/30/2018   Procedure: BILIARY BRUSHING;  Surgeon: Wilhelmenia Aloha Raddle., MD;  Location: [facility] ENDOSCOPY;  Service: Gastroenterology;;   BILIARY DILATION  08/10/2018   Procedure: BILIARY DILATION;  Surgeon: Wilhelmenia Aloha Raddle., MD;  Location: [facility] ENDOSCOPY;  Service: Gastroenterology;;   BILIARY DILATION   08/27/2018   Procedure: BILIARY DILATION;  Surgeon: Wilhelmenia Aloha Raddle., MD;  Location: [facility] ENDOSCOPY;  Service: Gastroenterology;;   BILIARY DILATION  01/24/2020   Procedure: BILIARY DILATION;  Surgeon: Wilhelmenia Aloha Raddle., MD;  Location: [facility] ENDOSCOPY;  Service: Gastroenterology;;   BILIARY STENT PLACEMENT  08/10/2018   Procedure: BILIARY STENT PLACEMENT;  Surgeon: Wilhelmenia Aloha Raddle., MD;  Location: [facility] ENDOSCOPY;  Service: Gastroenterology;;   BILIARY STENT PLACEMENT  08/27/2018   Procedure: BILIARY STENT PLACEMENT;  Surgeon: Wilhelmenia Aloha Raddle., MD;  Location: [facility] ENDOSCOPY;  Service: Gastroenterology;;   BILIARY STENT PLACEMENT  11/30/2018   Procedure: BILIARY STENT PLACEMENT;  Surgeon: Wilhelmenia Aloha Raddle., MD;  Location: [facility] ENDOSCOPY;  Service: Gastroenterology;;   BIOPSY  07/23/2018   Procedure: BIOPSY;  Surgeon: Wilhelmenia Aloha Raddle., MD;  Location: [facility] ENDOSCOPY;  Service: Gastroenterology;;   BIOPSY  01/24/2020   Procedure: BIOPSY;  Surgeon: Wilhelmenia Aloha Raddle., MD;  Location: [facility] ENDOSCOPY;  Service: Gastroenterology;;   CHOLECYSTECTOMY N/A 09/02/2018   Procedure: LAPAROSCOPIC CHOLECYSTECTOMY;  Surgeon: Aron Shoulders, MD;  Location: [facility] OR;  Service: General;  Laterality: N/A;   COLONOSCOPY     normal    ENDARTERECTOMY FEMORAL Left 04/03/2024   Procedure: ENDARTERECTOMY, FEMORAL;  Surgeon: Serene Gaile ORN, MD;  Location: [facility] OR;  Service: Vascular;  Laterality: Left;   ENDOSCOPIC RETROGRADE CHOLANGIOPANCREATOGRAPHY (ERCP) WITH PROPOFOL  N/A 08/10/2018   Procedure: ENDOSCOPIC RETROGRADE CHOLANGIOPANCREATOGRAPHY (ERCP) WITH PROPOFOL ;  Surgeon: Wilhelmenia Aloha Raddle., MD;  Location: [facility] ENDOSCOPY;  Service: Gastroenterology;  Laterality: N/A;   ENDOSCOPIC RETROGRADE CHOLANGIOPANCREATOGRAPHY (ERCP) WITH PROPOFOL  N/A 11/30/2018   Procedure: ENDOSCOPIC RETROGRADE CHOLANGIOPANCREATOGRAPHY (ERCP) WITH PROPOFOL ;  Surgeon: Wilhelmenia Aloha Raddle., MD;  Location: [facility] ENDOSCOPY;  Service:  Gastroenterology;  Laterality: N/A;   ENDOSCOPIC RETROGRADE CHOLANGIOPANCREATOGRAPHY (ERCP) WITH PROPOFOL  N/A 01/24/2020   Procedure: ENDOSCOPIC RETROGRADE CHOLANGIOPANCREATOGRAPHY (ERCP) WITH PROPOFOL ;  Surgeon: Wilhelmenia Aloha Raddle., MD;  Location: [facility] ENDOSCOPY;  Service: Gastroenterology;  Laterality: N/A;   ERCP N/A 07/23/2018   Procedure: ENDOSCOPIC RETROGRADE CHOLANGIOPANCREATOGRAPHY (ERCP);  Surgeon: Wilhelmenia Aloha Raddle., MD;  Location: [facility] ENDOSCOPY;  Service: Gastroenterology;  Laterality: N/A;   ERCP N/A 08/27/2018   Procedure: ENDOSCOPIC RETROGRADE CHOLANGIOPANCREATOGRAPHY (ERCP);  Surgeon: Wilhelmenia Aloha Raddle., MD;  Location: [facility] ENDOSCOPY;  Service: Gastroenterology;  Laterality: N/A;   ESOPHAGOGASTRODUODENOSCOPY N/A 01/24/2020   Procedure: ESOPHAGOGASTRODUODENOSCOPY (EGD);  Surgeon: Wilhelmenia Aloha Raddle., MD;  Location: [facility] ENDOSCOPY;  Service: Gastroenterology;  Laterality: N/A;   ESOPHAGOGASTRODUODENOSCOPY (EGD) WITH PROPOFOL  N/A 08/27/2018   Procedure: ESOPHAGOGASTRODUODENOSCOPY (EGD) WITH PROPOFOL ;  Surgeon: Wilhelmenia Aloha Raddle., MD;  Location: [facility] ENDOSCOPY;  Service: Gastroenterology;  Laterality: N/A;   EUS N/A 08/27/2018   Procedure: ESOPHAGEAL ENDOSCOPIC ULTRASOUND (EUS) RADIAL;  Surgeon: Wilhelmenia Aloha Raddle., MD;  Location: [facility] ENDOSCOPY;  Service: Gastroenterology;  Laterality: N/A;   FINE NEEDLE ASPIRATION  08/27/2018   Procedure: FINE NEEDLE ASPIRATION (FNA) LINEAR;  Surgeon: Wilhelmenia Aloha Raddle., MD;  Location: [facility] ENDOSCOPY;  Service: Gastroenterology;;   QUILLIAN EXPOSURE Left 04/03/2024   Procedure: GROIN EXPOSURE;  Surgeon: Serene Gaile ORN, MD;  Location: [facility] OR;  Service: Vascular;  Laterality: Left;   INCISIONAL HERNIA REPAIR N/A 06/23/2020   Procedure: OPEN INCISIONAL HERNIA REPAIR WITH MESH;  Surgeon: Stechschulte, Deward PARAS, MD;  Location: [facility] ORS;  Service: General;  Laterality: N/A;  ROOM 2 STARTING AT 11:00AM FOR 90 MIN   IR IMAGING GUIDED PORT INSERTION  01/08/2018    LAPAROSCOPIC NEPHRECTOMY Left 08/01/2016   Procedure: LAPAROSCOPIC  RADICAL NEPHRECTOMY/ REPAIR OF UMBILICAL HERNIA;  Surgeon: Gretel Ferrara, MD;  Location: [facility] ORS;  Service: Urology;  Laterality: Left;   LOWER EXTREMITY ANGIOGRAM  04/03/2024   Procedure: GERALYN, LOWER EXTREMITY;  Surgeon: Serene Gaile ORN, MD;  Location: [facility] OR;  Service: Vascular;;   LOWER EXTREMITY ANGIOGRAM Left 04/03/2024   Procedure: GERALYN, LOWER EXTREMITY WITH PERONEAL AND TRUNK BALLOOON ANGIOPLASTY;  Surgeon: Serene Gaile ORN, MD;  Location: [facility] OR;  Service: Vascular;  Laterality: Left;   LOWER EXTREMITY ANGIOGRAPHY N/A 12/29/2023   Procedure: Lower Extremity Angiography;  Surgeon: Sheree Penne Bruckner, MD;  Location: [facility] INVASIVE CV LAB;  Service: Cardiovascular;  Laterality: N/A;   LOWER EXTREMITY INTERVENTION N/A 12/29/2023   Procedure: LOWER EXTREMITY INTERVENTION;  Surgeon: Sheree Penne Bruckner, MD;  Location: [facility] INVASIVE CV LAB;  Service: Cardiovascular;  Laterality: N/A;   LOWER EXTREMITY INTERVENTION N/A 04/02/2024   Procedure: LOWER EXTREMITY INTERVENTION;  Surgeon: Magda Debby SAILOR, MD;  Location: [facility] INVASIVE CV LAB;  Service: Cardiovascular;  Laterality: N/A;   LYSIS RE-CHECK Left 04/03/2024   Procedure: LYSIS RE-CHECK;  Surgeon: Serene Gaile ORN, MD;  Location: [facility] OR;  Service: Vascular;  Laterality: Left;   PATCH ANGIOPLASTY Left 04/03/2024   Procedure: ANGIOPLASTY, USING PATCH GRAFT, LEFT COMMON FEMORAL ARTERY;  Surgeon: Serene Gaile ORN, MD;  Location: [facility] OR;  Service: Vascular;  Laterality: Left;   PERIPHERAL VASCULAR INTERVENTION  10/21/2022   Procedure: PERIPHERAL VASCULAR INTERVENTION;  Surgeon: Sheree Penne Bruckner, MD;  Location: [facility] INVASIVE CV LAB;  Service: Cardiovascular;;   REMOVAL OF STONES  07/23/2018   Procedure: REMOVAL OF GALL STONES;  Surgeon: Wilhelmenia Aloha Raddle., MD;  Location: [facility] ENDOSCOPY;  Service: Gastroenterology;;   REMOVAL OF STONES  08/10/2018   Procedure: REMOVAL  OF STONES;  Surgeon: Wilhelmenia Aloha Raddle., MD;  Location: [facility] ENDOSCOPY;  Service: Gastroenterology;;   REMOVAL OF STONES  08/27/2018   Procedure: REMOVAL OF STONES;  Surgeon: Wilhelmenia Aloha Raddle., MD;  Location: [facility] ENDOSCOPY;  Service: Gastroenterology;;   REMOVAL OF STONES  01/24/2020   Procedure: REMOVAL OF STONES;  Surgeon: Wilhelmenia Aloha Raddle., MD;  Location: [facility] ENDOSCOPY;  Service: Gastroenterology;;   ANNETT  07/23/2018   Procedure: ANNETT;  Surgeon: Wilhelmenia Aloha Raddle., MD;  Location: [facility] ENDOSCOPY;  Service: Gastroenterology;;   CLEDA REMOVAL  08/27/2018   Procedure: STENT REMOVAL;  Surgeon: Wilhelmenia Aloha Raddle., MD;  Location: [facility] ENDOSCOPY;  Service: Gastroenterology;;   CLEDA REMOVAL  11/30/2018   Procedure: STENT REMOVAL;  Surgeon: Wilhelmenia Aloha Raddle., MD;  Location: [facility] ENDOSCOPY;  Service: Gastroenterology;;   CLEDA REMOVAL  01/24/2020   Procedure: STENT REMOVAL;  Surgeon: Wilhelmenia Aloha Raddle., MD;  Location: [facility] ENDOSCOPY;  Service: Gastroenterology;;   THIGH FASCIOTOMY Right 04/03/2024   Procedure: FASCIOTOMY, 4 compartment right lower leg;  Surgeon: Serene Gaile ORN, MD;  Location: [facility] OR;  Service: Vascular;  Laterality: Right;   UPPER GI ENDOSCOPY     x 1   VAGINAL PROLAPSE REPAIR  11/18/2019   [facility]    Social History   Socioeconomic History   Marital status:  Widowed    Spouse name: Not on file   Number of children: 3   Years of education: Not on file   Highest education level: Not on file  Occupational History   Occupation: retired  Tobacco Use   Smoking status: Former    Current packs/day: 0.00    Average packs/day: 1 pack/day for 8.0 years (8.0 ttl pk-yrs)    Types: Cigarettes    Start date: 06/18/1956    Quit date: 06/18/1964    Years since quitting: 59.9   Smokeless tobacco: Never  Vaping Use   Vaping status: Never Used  Substance and Sexual Activity   Alcohol  use: No   Drug use: No   Sexual activity: Not Currently     Birth control/protection: Post-menopausal  Other Topics Concern   Not on file  Social History Narrative   Married.  Three children   Social Drivers of Health   Tobacco Use: Medium Risk (05/05/2024)   Patient History    Smoking Tobacco Use: Former    Smokeless Tobacco Use: Never    Passive Exposure: Not on file  Financial Resource Strain: Low Risk (05/23/2022)   Overall Financial Resource Strain (CARDIA)    Difficulty of Paying Living Expenses: Not hard at all  Food Insecurity: No Food Insecurity (04/02/2024)   Epic    Worried About Programme Researcher, Broadcasting/film/video in the Last Year: Never true    Ran Out of Food in the Last Year: Never true  Transportation Needs: No Transportation Needs (04/02/2024)   Epic    Lack of Transportation (Medical): No    Lack of Transportation (Non-Medical): No  Physical Activity: Not on file  Stress: Not on file  Social Connections: Socially Isolated (04/02/2024)   Social Connection and Isolation Panel    Frequency of Communication with Friends and Family: Once a week    Frequency of Social Gatherings with Friends and Family: Once a week    Attends Religious Services: 1 to 4 times per year    Active Member of [employer] or Organizations: No    Attends Banker Meetings: Never    Marital Status: Widowed  Intimate Partner Violence: Not At Risk (04/02/2024)   Epic    Fear of Current or Ex-Partner: No    Emotionally Abused: No    Physically Abused: No    Sexually Abused: No  Depression (PHQ2-9): Not on file  Alcohol  Screen: Not on file  Housing: Low Risk (04/02/2024)   Epic    Unable to Pay for Housing in the Last Year: No    Number of Times Moved in the Last Year: 0    Homeless in the Last Year: No  Utilities: Not At Risk (04/02/2024)   Epic    Threatened with loss of utilities: No  Health Literacy: Not on file    Family History  Problem Relation Age of Onset   Heart attack Father 57   Heart disease Brother 76       CABG   Colon cancer Neg Hx     Esophageal cancer Neg Hx    Inflammatory bowel disease Neg Hx    Liver disease Neg Hx    Pancreatic cancer Neg Hx    Rectal cancer Neg Hx    Stomach cancer Neg Hx     Current Outpatient Medications  Medication Sig Dispense Refill   atorvastatin  (LIPITOR) 80 MG tablet Take 80 mg by mouth daily.     acetaminophen  (TYLENOL ) 325 MG tablet Take 1-2 tablets (  325-650 mg total) by mouth every 4 (four) hours as needed for mild pain (pain score 1-3) (or temp >/= 101 F). (Patient not taking: Reported on 04/06/2024)     [Paused] amLODipine  (NORVASC ) 5 MG tablet Take 5 mg by mouth daily.  6   aspirin  EC 81 MG tablet Take 1 tablet (81 mg total) by mouth daily. Swallow whole. (Patient not taking: Reported on 05/05/2024) 30 tablet 0   B Complex-C (B-COMPLEX WITH VITAMIN C) tablet Take 1 tablet by mouth daily. 100 tablet 0   Blood Glucose Monitoring Suppl DEVI 1 each by Does not apply route as directed. Dispense based on patient and insurance preference. Use up to four times daily as directed. (FOR ICD-10 E10.9, E11.9). 1 each 0   vitamin D3 (CHOLECALCIFEROL ) 25 MCG tablet Take 3 tablets (3,000 Units total) by mouth daily. 90 tablet 0   clobetasol  ointment (TEMOVATE ) 0.05 % Apply 1 application  topically 2 (two) times daily. lichen sclerosis     clopidogrel  (PLAVIX ) 75 MG tablet Take 75 mg by mouth daily.     diclofenac  Sodium (VOLTAREN ) 1 % GEL Apply 2 g topically 4 (four) times daily as needed (for pain). (Patient not taking: Reported on 04/06/2024)     Glucose Blood (BLOOD GLUCOSE TEST STRIPS) STRP 1 each by Does not apply route as directed. Dispense based on patient and insurance preference. Use up to four times daily as directed. (FOR ICD-10 E10.9, E11.9). 100 strip 0   glucose blood (ONETOUCH ULTRA TEST) test strip Use as instructed 100 each 12   hydrocortisone  (CORTEF ) 10 MG tablet Take 1 tablet (10 mg total) by mouth daily.     hydrocortisone  (CORTEF ) 5 MG tablet Take 1 tablet (5 mg total) by mouth  at bedtime. (Patient not taking: Reported on 04/06/2024)     insulin  aspart (NOVOLOG ) 100 UNIT/ML injection Inject 0-9 Units into the skin every 4 (four) hours. (Patient not taking: Reported on 04/06/2024)     insulin  aspart (NOVOLOG  FLEXPEN) 100 UNIT/ML FlexPen Inject 0-9 Units into the skin 4 (four) times daily -  before meals and at bedtime. 15 mL 11   insulin  glargine (LANTUS ) 100 UNIT/ML Solostar Pen Inject 14 Units into the skin 2 (two) times daily. 15 mL 0   ipratropium (ATROVENT  HFA) 17 MCG/ACT inhaler Inhale 2 puffs into the lungs every 6 (six) hours as needed for wheezing.     Lancet Device MISC 1 each by Does not apply route as directed. Dispense based on patient and insurance preference. Use up to four times daily as directed. (FOR ICD-10 E10.9, E11.9). 1 each 0   Lancets (ONETOUCH ULTRASOFT) lancets USE 1 LANCET PER BLOOD SUGAR CHECK 60 each 0   Lancets MISC 1 each by Does not apply route as directed. Dispense based on patient and insurance preference. Use up to four times daily as directed. (FOR ICD-10 E10.9, E11.9). 100 each 0   levothyroxine  (SYNTHROID ) 75 MCG tablet Take 75 mcg by mouth daily before breakfast.      lidocaine  (LIDODERM ) 5 % Place 3 patches onto the skin daily. Remove & Discard patch within 12 hours or as directed by MD 30 patch 0   melatonin 3 MG TABS tablet Take 1 tablet (3 mg total) by mouth at bedtime.     Nystatin (GERHARDT'S BUTT CREAM) CREA Apply 1 Application topically 2 (two) times daily. (Patient not taking: Reported on 04/06/2024)     Oxycodone  HCl 10 MG TABS Take 1 tablet (10 mg total)  by mouth every 4 (four) hours as needed. 20 tablet 0   polyethylene glycol powder (GLYCOLAX /MIRALAX ) 17 GM/SCOOP powder Dissolve 1 capful (17g) in 4-8 ounces of liquid and take by mouth daily. 238 g 0   [Paused] repaglinide  (PRANDIN ) 0.5 MG tablet Take 0.5 mg by mouth 2 (two) times daily.     senna-docusate (SENOKOT-S) 8.6-50 MG tablet Take 1 tablet by mouth daily with supper.  30 tablet 0   terbinafine  (LAMISIL ) 1 % cream Apply topically daily.     No current facility-administered medications for this visit.    Allergies[1]   REVIEW OF SYSTEMS:  Negative unless noted in HPI [X]  denotes positive finding, [ ]  denotes negative finding Cardiac  Comments:  Chest pain or chest pressure:    Shortness of breath upon exertion:    Short of breath when lying flat:    Irregular heart rhythm:        Vascular    Pain in calf, thigh, or hip brought on by ambulation:    Pain in feet at night that wakes you up from your sleep:     Blood clot in your veins:    Leg swelling:         Pulmonary    Oxygen at home:    Productive cough:     Wheezing:         Neurologic    Sudden weakness in arms or legs:     Sudden numbness in arms or legs:     Sudden onset of difficulty speaking or slurred speech:    Temporary loss of vision in one eye:     Problems with dizziness:         Gastrointestinal    Blood in stool:     Vomited blood:         Genitourinary    Burning when urinating:     Blood in urine:        Psychiatric    Major depression:         Hematologic    Bleeding problems:    Problems with blood clotting too easily:        Skin    Rashes or ulcers:        Constitutional    Fever or chills:      PHYSICAL EXAMINATION:  Vitals:   05/05/24 1009  BP: (!) 145/82  Pulse: 80  Temp: 98 F (36.7 C)  TempSrc: Temporal  Weight: 122 lb 3.2 oz (55.4 kg)    General:  WDWN in NAD; vital signs documented above Gait: uses cane HENT: WNL, normocephalic Pulmonary: normal non-labored breathing Cardiac: regular HR Abdomen: soft Vascular Exam/Pulses: 2+ femoral pulses bilaterally, left groin incision healing well. Staples removed. There is a small seroma in proximal left thigh. No signs of infection. Right medial and lateral fasciotomy incisions healing well. Staples removed Extremities: without ischemic changes, without Gangrene , without cellulitis;  without open wounds;  Musculoskeletal: no muscle wasting or atrophy  Neurologic: A&O X 3 Psychiatric:  The pt has Normal affect.   Non-Invasive Vascular Imaging:   VAS US  Lower extremity arterial duplex right: Summary:  Right: The tibioperoneal trunk, anterior tibial artery, and posterior tibial artery are not well visualized. Patient has staples in the proximal calf and in the prox lateral shin. Unable to visualize stent walls clearly.   +-------+-----------+----------------+------------+------------+  ABI/TBIToday's ABIToday's TBI     Previous ABIPrevious TBI  +-------+-----------+----------------+------------+------------+  Right 0.83       0.35  0           0             +-------+-----------+----------------+------------+------------+  Left  0.30       unable to obtain0.58        0.32          +-------+-----------+----------------+------------+------------+  Right ABIs and TBIs appear increased. Left ABIs and TBIs appear decreased.      ASSESSMENT/PLAN:: 84 y.o. female  presents for follow up of PAD. She recently underwent Angiogram with Right peroneal, TP trunk, popliteal artery angioplasty (3x112mm Sterling) and catheter directed thrombolysis (EKOS) on 04/02/24 by Dr. Magda. This was followed by lysis catheter recheck by Dr. Serene on 04/03/24 with Angioplasty of the right peroneal artery and tibioperoneal trunk as well as stenting of the right superficial femoral artery. This was performed secondary to rest pain with occlusion of previously placed SFA stent. Unfortunately  post procedure she developed compartment syndrome of her right leg with acute occlusion of her left common femoral and had to undergo 1#: 4 compartment fasciotomy, right leg #2: Left iliofemoral endarterectomy with bovine pericardial patch angioplasty #3: Left leg angiogram and #4: Placement of first 38 cm Kerecis skin substitute by Dr. Serene. Her incisions are all intact and healing  well. Staples removed today. Her rest pain in the right leg is resolved. She is having some incisional pain in the left groin. She also has a small seroma present. Her ultrasound today shows patent arteries in the right lower extremity although her tibial vessels were not well visualized. Her ABI shows improved post intervention. Her left ABI has decreased with loss of a toe pressure. She has known severe left tibial disease. She is presently without any claudication, rest pain or tissue loss on the LLE. No indication for intervention at this time.  - continue Aspirin , statin, plavix  - advised her to call for earlier appointment if the seroma in proximal thigh increases in size, becomes very red, feverish or if she develops fever/ chills -continue to clean incisions daily with mild soap and water  and place dry gauze in left groin to wick moisture - she will return in 2-3 weeks for incision check   Teretha Damme, PA-C [facility] [phone]  Clinic MD:   Sheree      [1]  Allergies Allergen Reactions   Ceftriaxone  Anaphylaxis   Vibramycin [Doxycycline] Nausea And Vomiting   Adhesive [Tape] Other (See Comments)    Very thin skin, tears easily  OK to use paper tape   Cephalosporins Dermatitis    Tolerated cefazolin  and ceftriaxone     Cymbalta [Duloxetine Hcl] Other (See Comments)    Dizziness  Woozy   Nsaids Other (See Comments)    Told to avoid, patient has 1 kidney   Statins Other (See Comments)    Liver/kidney issues.   "

## 2024-05-10 ENCOUNTER — Other Ambulatory Visit (HOSPITAL_COMMUNITY): Payer: Self-pay

## 2024-05-10 ENCOUNTER — Encounter (HOSPITAL_COMMUNITY)
Admission: RE | Admit: 2024-05-10 | Discharge: 2024-05-10 | Disposition: A | Discharge status: Home / Self Care | Source: Ambulatory Visit | Attending: Hematology | Admitting: Hematology

## 2024-05-10 DIAGNOSIS — C642 Malignant neoplasm of left kidney, except renal pelvis: Secondary | ICD-10-CM | POA: Insufficient documentation

## 2024-05-10 LAB — GLUCOSE, CAPILLARY: Glucose-Capillary: 106 mg/dL — ABNORMAL HIGH (ref 70–99)

## 2024-05-10 MED ORDER — FLUDEOXYGLUCOSE F - 18 (FDG) INJECTION
6.1000 | Freq: Once | INTRAVENOUS | Status: AC
  Administered 2024-05-10: 6.1 via INTRAVENOUS

## 2024-05-13 ENCOUNTER — Inpatient Hospital Stay: Attending: Hematology | Admitting: Hematology

## 2024-05-13 ENCOUNTER — Inpatient Hospital Stay

## 2024-05-13 ENCOUNTER — Other Ambulatory Visit: Payer: Self-pay

## 2024-05-13 DIAGNOSIS — C642 Malignant neoplasm of left kidney, except renal pelvis: Secondary | ICD-10-CM | POA: Diagnosis not present

## 2024-05-13 DIAGNOSIS — E039 Hypothyroidism, unspecified: Secondary | ICD-10-CM

## 2024-05-13 LAB — CMP (CANCER CENTER ONLY)
ALT: 18 U/L (ref 0–44)
AST: 21 U/L (ref 15–41)
Albumin: 4 g/dL (ref 3.5–5.0)
Alkaline Phosphatase: 68 U/L (ref 38–126)
Anion gap: 13 (ref 5–15)
BUN: 28 mg/dL — ABNORMAL HIGH (ref 8–23)
CO2: 20 mmol/L — ABNORMAL LOW (ref 22–32)
Calcium: 9.9 mg/dL (ref 8.9–10.3)
Chloride: 106 mmol/L (ref 98–111)
Creatinine: 1.7 mg/dL — ABNORMAL HIGH (ref 0.44–1.00)
GFR, Estimated: 30 mL/min — ABNORMAL LOW
Glucose, Bld: 101 mg/dL — ABNORMAL HIGH (ref 70–99)
Potassium: 4.3 mmol/L (ref 3.5–5.1)
Sodium: 139 mmol/L (ref 135–145)
Total Bilirubin: 0.4 mg/dL (ref 0.0–1.2)
Total Protein: 6.8 g/dL (ref 6.5–8.1)

## 2024-05-13 LAB — CBC WITH DIFFERENTIAL (CANCER CENTER ONLY)
Abs Immature Granulocytes: 0.08 K/uL — ABNORMAL HIGH (ref 0.00–0.07)
Basophils Absolute: 0.1 K/uL (ref 0.0–0.1)
Basophils Relative: 1 %
Eosinophils Absolute: 0.1 K/uL (ref 0.0–0.5)
Eosinophils Relative: 2 %
HCT: 34.4 % — ABNORMAL LOW (ref 36.0–46.0)
Hemoglobin: 11.1 g/dL — ABNORMAL LOW (ref 12.0–15.0)
Immature Granulocytes: 1 %
Lymphocytes Relative: 12 %
Lymphs Abs: 1 K/uL (ref 0.7–4.0)
MCH: 29.5 pg (ref 26.0–34.0)
MCHC: 32.3 g/dL (ref 30.0–36.0)
MCV: 91.5 fL (ref 80.0–100.0)
Monocytes Absolute: 0.8 K/uL (ref 0.1–1.0)
Monocytes Relative: 10 %
Neutro Abs: 6.5 K/uL (ref 1.7–7.7)
Neutrophils Relative %: 74 %
Platelet Count: 324 K/uL (ref 150–400)
RBC: 3.76 MIL/uL — ABNORMAL LOW (ref 3.87–5.11)
RDW: 14.6 % (ref 11.5–15.5)
WBC Count: 8.6 K/uL (ref 4.0–10.5)
nRBC: 0 % (ref 0.0–0.2)

## 2024-05-13 LAB — T4, FREE: Free T4: 1.4 ng/dL (ref 0.80–2.00)

## 2024-05-13 LAB — TSH: TSH: 2.24 u[IU]/mL (ref 0.350–4.500)

## 2024-05-13 NOTE — Progress Notes (Signed)
 " HEMATOLOGY ONCOLOGY PROGRESS NOTE  Date of service: 05/13/2024  Patient Care Team: Irven Ozell DEL, MD as PCP - General (Family Medicine) Faythe Purchase, MD as Consulting Physician (Endocrinology) Marlee Bernardino NOVAK, MD as Consulting Physician (Nephrology) Cleotilde Kuba, MD as Referring Physician (Internal Medicine) Mansouraty, Aloha Raddle., MD as Consulting Physician (Gastroenterology) Onesimo Emaline Brink, MD as Consulting Physician (Oncology) Aron Shoulders, MD as Consulting Physician (General Surgery) Lavona Agent, MD as Consulting Physician (Cardiology)  CHIEF COMPLAINT/PURPOSE OF CONSULTATION: Follow-up for continued evaluation and management of 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   HISTORY OF PRESENTING ILLNESS: Please see previous note for details of initial presentation.   SUMMARY OF ONCOLOGIC HISTORY: Oncology History  Cancer, 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HCC)  01/10/2017 Initial Diagnosis   Cancer, 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HCC)   01/02/2018 - 01/16/2022 Chemotherapy   Patient is on Treatment Plan : RENAL CELL CARCINOMA NIVOLUMAB  Q14D     01/22/2022 Cancer Staging   Staging form: Bone - Appendicular Skeleton, Trunk, Skull, and Facial Bones, AJCC 8th Edition - Clinical: Stage IVB (pM1b) - Signed by Onesimo Emaline Brink, MD on 01/22/2022   02/01/2022 -  Chemotherapy   Patient is on Treatment Plan : RENAL CELL CARCINOMA Nivolumab  (240) q14d/     Renal cell carcinoma, left (HCC)  04/11/2017 Initial Diagnosis   Renal cell carcinoma (HCC)   01/02/2018 - 01/16/2022 Chemotherapy   The patient had nivolumab  (OPDIVO ) 240 mg in sodium chloride  0.9 % 100 mL chemo infusion, 240 mg, Intravenous, Once, 47 of 50 cycles Administration: 240 mg (01/02/2018), 240 mg (01/15/2018), 240 mg (01/30/2018), 240 mg (02/13/2018), 240 mg (02/27/2018), 240 mg (03/13/2018), 240 mg (03/27/2018), 240 mg (04/10/2018), 240 mg (04/24/2018), 240 mg (05/08/2018), 240 mg (05/22/2018), 240 mg (06/05/2018), 240 mg  (06/19/2018), 240 mg (08/28/2018), 240 mg (09/11/2018), 240 mg (10/09/2018), 240 mg (10/22/2018), 240 mg (11/06/2018), 240 mg (11/20/2018), 240 mg (12/04/2018), 240 mg (12/18/2018), 240 mg (01/01/2019), 240 mg (01/15/2019), 240 mg (01/29/2019), 240 mg (02/12/2019), 240 mg (02/26/2019), 240 mg (03/12/2019), 240 mg (03/26/2019), 240 mg (04/09/2019), 240 mg (04/28/2019), 240 mg (05/12/2019), 240 mg (05/27/2019), 240 mg (06/09/2019), 240 mg (06/23/2019), 240 mg (07/07/2019), 240 mg (07/21/2019), 240 mg (08/04/2019), 240 mg (08/18/2019), 240 mg (09/03/2019), 240 mg (09/15/2019), 240 mg (09/29/2019), 240 mg (10/12/2019), 240 mg (10/27/2019), 240 mg (11/10/2019), 240 mg (12/08/2019), 240 mg (12/22/2019), 240 mg (01/19/2020)  for chemotherapy treatment.    02/01/2022 -  Chemotherapy   Patient is on Treatment Plan : RENAL CELL CARCINOMA Nivolumab  (240) q14d/       INTERVAL HISTORY:  Audrey [name] is a 84 y.o. female who is here today for continued evaluation and management of metastatic renal cell carcinoma. She is ambulating with a cane.  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   Today, she reports she is doing well.  She has some inflammation in the groin from her recent vascular surgery. She reports the incisions in her legs are healing well.   She denies new HAs, lumps or bumps.    REVIEW OF SYSTEMS:   10 Point review of systems of done and is negative except as noted above.  MEDICAL HISTORY Past Medical History:  Diagnosis Date   Anemia    Arthritis    lower back, hips, hands   Biliary stricture (HCC)    Diabetes mellitus (HCC)    type 2  Early cataracts, bilateral    Md just watching   Elevated liver enzymes    Family history of adverse reaction to anesthesia    Daughter hard to wake up   Gallstones    GERD (gastroesophageal reflux disease)    occasional - diet controlled   History of blood transfusion 2018    History of hiatal hernia    HTN (hypertension)    Hyperlipidemia    Hypothyroidism    left renal ca dx'd 2018   renal cancer - left kidney removed, pill chemo x 1 yr   Myocardial infarction (HCC) 1991   no deficits   Peripheral vascular disease    PONV (postoperative nausea and vomiting)    SVD (spontaneous vaginal delivery)    x 3   Wears glasses     SURGICAL HISTORY Past Surgical History:  Procedure Laterality Date   ABDOMINAL AORTOGRAM N/A 12/29/2023   Procedure: ABDOMINAL AORTOGRAM;  Surgeon: Sheree Penne Bruckner, MD;  Location: [facility] INVASIVE CV LAB;  Service: Cardiovascular;  Laterality: N/A;   ABDOMINAL AORTOGRAM W/LOWER EXTREMITY N/A 10/21/2022   Procedure: ABDOMINAL AORTOGRAM W/LOWER EXTREMITY;  Surgeon: Sheree Penne Bruckner, MD;  Location: [facility] INVASIVE CV LAB;  Service: Cardiovascular;  Laterality: N/A;   ABDOMINAL AORTOGRAM W/LOWER EXTREMITY N/A 04/02/2024   Procedure: ABDOMINAL AORTOGRAM W/LOWER EXTREMITY;  Surgeon: Magda Debby SAILOR, MD;  Location: [facility] INVASIVE CV LAB;  Service: Cardiovascular;  Laterality: N/A;   BALLOON DILATION N/A 07/23/2018   Procedure: BALLOON DILATION;  Surgeon: Wilhelmenia Aloha Raddle., MD;  Location: [facility] ENDOSCOPY;  Service: Gastroenterology;  Laterality: N/A;   BILIARY BRUSHING  08/10/2018   Procedure: BILIARY BRUSHING;  Surgeon: Wilhelmenia Aloha Raddle., MD;  Location: [facility] ENDOSCOPY;  Service: Gastroenterology;;   BILIARY BRUSHING  11/30/2018   Procedure: BILIARY BRUSHING;  Surgeon: Wilhelmenia Aloha Raddle., MD;  Location: [facility] ENDOSCOPY;  Service: Gastroenterology;;   BILIARY DILATION  08/10/2018   Procedure: BILIARY DILATION;  Surgeon: Wilhelmenia Aloha Raddle., MD;  Location: [facility] ENDOSCOPY;  Service: Gastroenterology;;   BILIARY DILATION  08/27/2018   Procedure: BILIARY DILATION;  Surgeon: Wilhelmenia Aloha Raddle., MD;  Location: [facility] ENDOSCOPY;  Service: Gastroenterology;;   BILIARY DILATION  01/24/2020   Procedure: BILIARY DILATION;  Surgeon: Wilhelmenia Aloha Raddle., MD;  Location: [facility] ENDOSCOPY;  Service: Gastroenterology;;   BILIARY STENT PLACEMENT  08/10/2018   Procedure: BILIARY STENT PLACEMENT;  Surgeon: Wilhelmenia Aloha Raddle., MD;  Location: [facility] ENDOSCOPY;  Service: Gastroenterology;;   BILIARY STENT PLACEMENT  08/27/2018   Procedure: BILIARY STENT PLACEMENT;  Surgeon: Wilhelmenia Aloha Raddle., MD;  Location: [facility] ENDOSCOPY;  Service: Gastroenterology;;   BILIARY STENT PLACEMENT  11/30/2018   Procedure: BILIARY STENT PLACEMENT;  Surgeon: Wilhelmenia Aloha Raddle., MD;  Location: [facility] ENDOSCOPY;  Service: Gastroenterology;;   BIOPSY  07/23/2018   Procedure: BIOPSY;  Surgeon: Wilhelmenia Aloha Raddle., MD;  Location: [facility] ENDOSCOPY;  Service: Gastroenterology;;   BIOPSY  01/24/2020   Procedure: BIOPSY;  Surgeon: Wilhelmenia Aloha Raddle., MD;  Location: [facility] ENDOSCOPY;  Service: Gastroenterology;;   CHOLECYSTECTOMY N/A 09/02/2018   Procedure: LAPAROSCOPIC CHOLECYSTECTOMY;  Surgeon: Aron Shoulders, MD;  Location: [facility] OR;  Service: General;  Laterality: N/A;   COLONOSCOPY     normal    ENDARTERECTOMY FEMORAL Left 04/03/2024   Procedure: ENDARTERECTOMY, FEMORAL;  Surgeon: Serene Gaile ORN, MD;  Location: [facility] OR;  Service: Vascular;  Laterality: Left;   ENDOSCOPIC RETROGRADE CHOLANGIOPANCREATOGRAPHY (ERCP) WITH PROPOFOL  N/A 08/10/2018   Procedure: ENDOSCOPIC RETROGRADE CHOLANGIOPANCREATOGRAPHY (ERCP) WITH PROPOFOL ;  Surgeon: Wilhelmenia Aloha Raddle., MD;  Location: [facility] ENDOSCOPY;  Service: Gastroenterology;  Laterality: N/A;   ENDOSCOPIC RETROGRADE CHOLANGIOPANCREATOGRAPHY (ERCP) WITH PROPOFOL  N/A 11/30/2018   Procedure: ENDOSCOPIC RETROGRADE CHOLANGIOPANCREATOGRAPHY (ERCP) WITH PROPOFOL ;  Surgeon: Wilhelmenia Aloha Raddle., MD;  Location: [facility] ENDOSCOPY;  Service: Gastroenterology;  Laterality: N/A;   ENDOSCOPIC RETROGRADE CHOLANGIOPANCREATOGRAPHY (ERCP) WITH PROPOFOL  N/A 01/24/2020   Procedure: ENDOSCOPIC RETROGRADE CHOLANGIOPANCREATOGRAPHY (ERCP) WITH PROPOFOL ;  Surgeon:  Wilhelmenia Aloha Raddle., MD;  Location: [facility] ENDOSCOPY;  Service: Gastroenterology;  Laterality: N/A;   ERCP N/A 07/23/2018   Procedure: ENDOSCOPIC RETROGRADE CHOLANGIOPANCREATOGRAPHY (ERCP);  Surgeon: Wilhelmenia Aloha Raddle., MD;  Location: [facility] ENDOSCOPY;  Service: Gastroenterology;  Laterality: N/A;   ERCP N/A 08/27/2018   Procedure: ENDOSCOPIC RETROGRADE CHOLANGIOPANCREATOGRAPHY (ERCP);  Surgeon: Wilhelmenia Aloha Raddle., MD;  Location: [facility] ENDOSCOPY;  Service: Gastroenterology;  Laterality: N/A;   ESOPHAGOGASTRODUODENOSCOPY N/A 01/24/2020   Procedure: ESOPHAGOGASTRODUODENOSCOPY (EGD);  Surgeon: Wilhelmenia Aloha Raddle., MD;  Location: [facility] ENDOSCOPY;  Service: Gastroenterology;  Laterality: N/A;   ESOPHAGOGASTRODUODENOSCOPY (EGD) WITH PROPOFOL  N/A 08/27/2018   Procedure: ESOPHAGOGASTRODUODENOSCOPY (EGD) WITH PROPOFOL ;  Surgeon: Wilhelmenia Aloha Raddle., MD;  Location: [facility] ENDOSCOPY;  Service: Gastroenterology;  Laterality: N/A;   EUS N/A 08/27/2018   Procedure: ESOPHAGEAL ENDOSCOPIC ULTRASOUND (EUS) RADIAL;  Surgeon: Wilhelmenia Aloha Raddle., MD;  Location: [facility] ENDOSCOPY;  Service: Gastroenterology;  Laterality: N/A;   FINE NEEDLE ASPIRATION  08/27/2018   Procedure: FINE NEEDLE ASPIRATION (FNA) LINEAR;  Surgeon: Wilhelmenia Aloha Raddle., MD;  Location: [facility] ENDOSCOPY;  Service: Gastroenterology;;   QUILLIAN EXPOSURE Left 04/03/2024   Procedure: GROIN EXPOSURE;  Surgeon: Serene Gaile ORN, MD;  Location: [facility] OR;  Service: Vascular;  Laterality: Left;   INCISIONAL HERNIA REPAIR N/A 06/23/2020   Procedure: OPEN INCISIONAL HERNIA REPAIR WITH MESH;  Surgeon: Lyndel Deward PARAS, MD;  Location: [facility] ORS;  Service: General;  Laterality: N/A;  ROOM 2 STARTING AT 11:00AM FOR 90 MIN   IR IMAGING GUIDED PORT INSERTION  01/08/2018   LAPAROSCOPIC NEPHRECTOMY Left 08/01/2016   Procedure: LAPAROSCOPIC  RADICAL NEPHRECTOMY/ REPAIR OF UMBILICAL HERNIA;  Surgeon: Gretel Ferrara, MD;  Location: [facility] ORS;  Service: Urology;  Laterality: Left;   LOWER  EXTREMITY ANGIOGRAM  04/03/2024   Procedure: GERALYN, LOWER EXTREMITY;  Surgeon: Serene Gaile ORN, MD;  Location: [facility] OR;  Service: Vascular;;   LOWER EXTREMITY ANGIOGRAM Left 04/03/2024   Procedure: GERALYN, LOWER EXTREMITY WITH PERONEAL AND TRUNK BALLOOON ANGIOPLASTY;  Surgeon: Serene Gaile ORN, MD;  Location: [facility] OR;  Service: Vascular;  Laterality: Left;   LOWER EXTREMITY ANGIOGRAPHY N/A 12/29/2023   Procedure: Lower Extremity Angiography;  Surgeon: Sheree Penne Bruckner, MD;  Location: [facility] INVASIVE CV LAB;  Service: Cardiovascular;  Laterality: N/A;   LOWER EXTREMITY INTERVENTION N/A 12/29/2023   Procedure: LOWER EXTREMITY INTERVENTION;  Surgeon: Sheree Penne Bruckner, MD;  Location: [facility] INVASIVE CV LAB;  Service: Cardiovascular;  Laterality: N/A;   LOWER EXTREMITY INTERVENTION N/A 04/02/2024   Procedure: LOWER EXTREMITY INTERVENTION;  Surgeon: Magda Debby SAILOR, MD;  Location: [facility] INVASIVE CV LAB;  Service: Cardiovascular;  Laterality: N/A;   LYSIS RE-CHECK Left 04/03/2024   Procedure: LYSIS RE-CHECK;  Surgeon: Serene Gaile ORN, MD;  Location: [facility] OR;  Service: Vascular;  Laterality: Left;   PATCH ANGIOPLASTY Left 04/03/2024   Procedure: ANGIOPLASTY, USING PATCH GRAFT, LEFT COMMON FEMORAL ARTERY;  Surgeon: Serene Gaile ORN, MD;  Location: [facility] OR;  Service: Vascular;  Laterality: Left;   PERIPHERAL VASCULAR INTERVENTION  10/21/2022   Procedure: PERIPHERAL VASCULAR INTERVENTION;  Surgeon: Sheree Penne Bruckner, MD;  Location: [facility] INVASIVE CV LAB;  Service: Cardiovascular;;   REMOVAL OF STONES  07/23/2018   Procedure: REMOVAL OF GALL STONES;  Surgeon: Wilhelmenia Aloha Raddle., MD;  Location: [facility] ENDOSCOPY;  Service: Gastroenterology;;   REMOVAL OF STONES  08/10/2018   Procedure: REMOVAL OF STONES;  Surgeon: Wilhelmenia Aloha Raddle., MD;  Location: [facility] ENDOSCOPY;  Service: Gastroenterology;;   REMOVAL OF STONES  08/27/2018   Procedure: REMOVAL OF STONES;  Surgeon: Wilhelmenia Aloha Raddle., MD;   Location: [facility] ENDOSCOPY;  Service: Gastroenterology;;   REMOVAL OF STONES  01/24/2020   Procedure: REMOVAL OF STONES;  Surgeon: Wilhelmenia Aloha Raddle., MD;  Location: [facility] ENDOSCOPY;  Service: Gastroenterology;;   ANNETT  07/23/2018   Procedure: ANNETT;  Surgeon: Wilhelmenia Aloha Raddle., MD;  Location: [facility] ENDOSCOPY;  Service: Gastroenterology;;   CLEDA REMOVAL  08/27/2018   Procedure: STENT REMOVAL;  Surgeon: Wilhelmenia Aloha Raddle., MD;  Location: [facility] ENDOSCOPY;  Service: Gastroenterology;;   CLEDA REMOVAL  11/30/2018   Procedure: STENT REMOVAL;  Surgeon: Wilhelmenia Aloha Raddle., MD;  Location: [facility] ENDOSCOPY;  Service: Gastroenterology;;   CLEDA REMOVAL  01/24/2020   Procedure: STENT REMOVAL;  Surgeon: Wilhelmenia Aloha Raddle., MD;  Location: [facility] ENDOSCOPY;  Service: Gastroenterology;;   THIGH FASCIOTOMY Right 04/03/2024   Procedure: FASCIOTOMY, 4 compartment right lower leg;  Surgeon: Serene Gaile ORN, MD;  Location: [facility] OR;  Service: Vascular;  Laterality: Right;   UPPER GI ENDOSCOPY     x 1   VAGINAL PROLAPSE REPAIR  11/18/2019   [facility]    SOCIAL HISTORY Social History[1]  Social History   Social History Narrative   Married.  Three children    SOCIAL DRIVERS OF HEALTH SDOH Screenings   Food Insecurity: No Food Insecurity (04/02/2024)  Housing: Low Risk (04/02/2024)  Transportation Needs: No Transportation Needs (04/02/2024)  Utilities: Not At Risk (04/02/2024)  Financial Resource Strain: Low Risk (05/23/2022)  Social Connections: Socially Isolated (04/02/2024)  Tobacco Use: Medium Risk (05/05/2024)     FAMILY HISTORY Family History  Problem Relation Age of Onset   Heart attack Father 50   Heart disease Brother 68       CABG   Colon cancer Neg Hx    Esophageal cancer Neg Hx    Inflammatory bowel disease Neg Hx    Liver disease Neg Hx    Pancreatic cancer Neg Hx    Rectal cancer Neg Hx    Stomach cancer Neg Hx      ALLERGIES: is allergic to ceftriaxone ,  vibramycin [doxycycline], adhesive [tape], cephalosporins, cymbalta [duloxetine hcl], nsaids, and statins.  MEDICATIONS  Current Outpatient Medications  Medication Sig Dispense Refill   acetaminophen  (TYLENOL ) 325 MG tablet Take 1-2 tablets (325-650 mg total) by mouth every 4 (four) hours as needed for mild pain (pain score 1-3) (or temp >/= 101 F). (Patient not taking: Reported on 04/06/2024)     [Paused] amLODipine  (NORVASC ) 5 MG tablet Take 5 mg by mouth daily.  6   aspirin  EC 81 MG tablet Take 1 tablet (81 mg total) by mouth daily. Swallow whole. (Patient not taking: Reported on 05/05/2024) 30 tablet 0   atorvastatin  (LIPITOR) 80 MG tablet Take 80 mg by mouth daily.     B Complex-C (B-COMPLEX WITH VITAMIN C) tablet Take 1 tablet by mouth daily. 100 tablet 0   Blood Glucose Monitoring Suppl DEVI 1 each by Does not apply route as directed. Dispense based on patient and insurance preference. Use up to four times daily as directed. (FOR ICD-10 E10.9, E11.9). 1 each 0  vitamin D3 (CHOLECALCIFEROL ) 25 MCG tablet Take 3 tablets (3,000 Units total) by mouth daily. 90 tablet 0   clobetasol  ointment (TEMOVATE ) 0.05 % Apply 1 application  topically 2 (two) times daily. lichen sclerosis     clopidogrel  (PLAVIX ) 75 MG tablet Take 75 mg by mouth daily.     diclofenac  Sodium (VOLTAREN ) 1 % GEL Apply 2 g topically 4 (four) times daily as needed (for pain). (Patient not taking: Reported on 04/06/2024)     Glucose Blood (BLOOD GLUCOSE TEST STRIPS) STRP 1 each by Does not apply route as directed. Dispense based on patient and insurance preference. Use up to four times daily as directed. (FOR ICD-10 E10.9, E11.9). 100 strip 0   glucose blood (ONETOUCH ULTRA TEST) test strip Use as instructed 100 each 12   hydrocortisone  (CORTEF ) 10 MG tablet Take 1 tablet (10 mg total) by mouth daily.     hydrocortisone  (CORTEF ) 5 MG tablet Take 1 tablet (5 mg total) by mouth at bedtime. (Patient not taking: Reported on  04/06/2024)     insulin  aspart (NOVOLOG ) 100 UNIT/ML injection Inject 0-9 Units into the skin every 4 (four) hours. (Patient not taking: Reported on 04/06/2024)     insulin  aspart (NOVOLOG  FLEXPEN) 100 UNIT/ML FlexPen Inject 0-9 Units into the skin 4 (four) times daily -  before meals and at bedtime. 15 mL 11   insulin  glargine (LANTUS ) 100 UNIT/ML Solostar Pen Inject 14 Units into the skin 2 (two) times daily. 15 mL 0   ipratropium (ATROVENT  HFA) 17 MCG/ACT inhaler Inhale 2 puffs into the lungs every 6 (six) hours as needed for wheezing.     Lancet Device MISC 1 each by Does not apply route as directed. Dispense based on patient and insurance preference. Use up to four times daily as directed. (FOR ICD-10 E10.9, E11.9). 1 each 0   Lancets (ONETOUCH ULTRASOFT) lancets USE 1 LANCET PER BLOOD SUGAR CHECK 60 each 0   Lancets MISC 1 each by Does not apply route as directed. Dispense based on patient and insurance preference. Use up to four times daily as directed. (FOR ICD-10 E10.9, E11.9). 100 each 0   levothyroxine  (SYNTHROID ) 75 MCG tablet Take 75 mcg by mouth daily before breakfast.      lidocaine  (LIDODERM ) 5 % Place 3 patches onto the skin daily. Remove & Discard patch within 12 hours or as directed by MD 30 patch 0   melatonin 3 MG TABS tablet Take 1 tablet (3 mg total) by mouth at bedtime.     Nystatin (GERHARDT'S BUTT CREAM) CREA Apply 1 Application topically 2 (two) times daily. (Patient not taking: Reported on 04/06/2024)     Oxycodone  HCl 10 MG TABS Take 1 tablet (10 mg total) by mouth every 4 (four) hours as needed. 20 tablet 0   polyethylene glycol powder (GLYCOLAX /MIRALAX ) 17 GM/SCOOP powder Dissolve 1 capful (17g) in 4-8 ounces of liquid and take by mouth daily. 238 g 0   [Paused] repaglinide  (PRANDIN ) 0.5 MG tablet Take 0.5 mg by mouth 2 (two) times daily.     senna-docusate (SENOKOT-S) 8.6-50 MG tablet Take 1 tablet by mouth daily with supper. 30 tablet 0   terbinafine  (LAMISIL ) 1 %  cream Apply topically daily.     No current facility-administered medications for this visit.    PHYSICAL EXAMINATION: ECOG PERFORMANCE STATUS: 1 - Symptomatic but completely ambulatory VITALS: Vitals:   05/13/24 1110  BP: 130/87  Pulse: 98  Resp: 18  Temp: (!) 97.5 F (36.4  C)  SpO2: 91%   Filed Weights   05/13/24 1110  Weight: 125 lb 1.6 oz (56.7 kg)   Body mass index is 26.15 kg/m.  GENERAL: alert, in no acute distress and comfortable SKIN: no acute rashes, no significant lesions EYES: conjunctiva are pink and non-injected, sclera anicteric OROPHARYNX: MMM, no exudates, no oropharyngeal erythema or ulceration NECK: supple, no JVD LYMPH:  no palpable lymphadenopathy in the cervical, axillary or inguinal regions LUNGS: clear to auscultation b/l with normal respiratory effort HEART: regular rate & rhythm ABDOMEN:  normoactive bowel sounds , non tender, not distended, no hepatosplenomegaly Extremity: no pedal edema PSYCH: alert & oriented x 3 with fluent speech NEURO: no focal motor/sensory deficits  LABORATORY DATA:   I have reviewed the data as listed     Latest Ref Rng & Units 05/13/2024   10:44 AM 04/16/2024    2:02 PM 04/14/2024    1:10 AM  CBC EXTENDED  WBC 4.0 - 10.5 K/uL 8.6  9.3  11.2   RBC 3.87 - 5.11 MIL/uL 3.76  3.00  2.77   Hemoglobin 12.0 - 15.0 g/dL 88.8  9.1  8.5   HCT 63.9 - 46.0 % 34.4  29.7  27.1   Platelets 150 - 400 K/uL 324  406  357   NEUT# 1.7 - 7.7 K/uL 6.5  7.3  8.1   Lymph# 0.7 - 4.0 K/uL 1.0  0.9  1.3        Latest Ref Rng & Units 05/13/2024   10:44 AM 04/16/2024    2:02 PM 04/14/2024    4:09 PM  CMP  Glucose 70 - 99 mg/dL 898  744  790   BUN 8 - 23 mg/dL 28  39  41   Creatinine 0.44 - 1.00 mg/dL 8.29  8.10  7.82   Sodium 135 - 145 mmol/L 139  134  131   Potassium 3.5 - 5.1 mmol/L 4.3  5.1  5.4   Chloride 98 - 111 mmol/L 106  107  102   CO2 22 - 32 mmol/L 20  17  20    Calcium  8.9 - 10.3 mg/dL 9.9  8.8  8.9   Total Protein  6.5 - 8.1 g/dL 6.8     Total Bilirubin 0.0 - 1.2 mg/dL 0.4     Alkaline Phos 38 - 126 U/L 68     AST 15 - 41 U/L 21     ALT 0 - 44 U/L 18      RADIOLOGY   Pet/ct 05/29/2023: IMPRESSION: 1. The mid to distal left femoral shaft focus of hypermetabolism on the prior exam was not included on today's study. If evaluation this area is desired, a whole-body PET would be needed. 2. Otherwise, left nephrectomy, without metastatic disease. 3. Apparent bladder wall thickening could be due to underdistention and/or cystitis. 4. Incidental findings, including: Coronary artery atherosclerosis. Aortic Atherosclerosis (ICD10-I70.0). Marked pelvic floor laxity. Sinus disease.     Electronically Signed   By: Rockey Kilts M.D.   On: 06/12/2023 09:44   RADIOGRAPHIC STUDIES: I have personally reviewed the radiological images as listed and agreed with the findings in the report. NM PET Image Restag (PS) Skull Base To Thigh Result Date: 05/10/2024 CLINICAL DATA:  Subsequent treatment strategy for renal cell carcinoma status post left nephrectomy. Evaluate for progression of metastatic disease. EXAM: NUCLEAR MEDICINE PET SKULL BASE TO THIGH TECHNIQUE: 6.16 mCi F-18 FDG was injected intravenously. Full-ring PET imaging was performed from the skull base to thigh  after the radiotracer. CT data was obtained and used for attenuation correction and anatomic localization. Fasting blood glucose: 106 mg/dl COMPARISON:  PET-CT 97/93/7974 and 01/06/2023. FINDINGS: Mediastinal blood pool activity: SUV max 1.5 NECK: No hypermetabolic cervical lymph nodes are identified.Fairly symmetric activity within the lymphoid tissue of Waldeyer's ring is within physiologic limits. No suspicious activity identified within the pharyngeal mucosal space. Incidental CT findings: Bilateral carotid atherosclerosis. Previously demonstrated paranasal sinus inflammatory changes have resolved. CHEST: There are no hypermetabolic mediastinal, hilar  or axillary lymph nodes. No hypermetabolic pulmonary activity or suspicious nodularity. Incidental CT findings: Right IJ Port-A-Cath extends to the lower SVC level. Cardiomegaly with aortic valvular calcifications. There is atherosclerosis of the aorta, great vessels and coronary arteries. Mild scarring at both lung bases. ABDOMEN/PELVIS: There is no hypermetabolic activity within the liver, adrenal glands, spleen or pancreas. There is no hypermetabolic nodal activity in the abdomen or pelvis. Incidental CT findings: Status post left nephrectomy without recurrent mass lesion or hypermetabolic activity in the nephrectomy bed. Stable right renal cortical and parapelvic cysts for which no specific follow-up imaging is recommended. Diverticular changes throughout the colon. Diffuse aortic and branch vessel atherosclerosis. Pelvic floor laxity. SKELETON: There is no hypermetabolic activity to suggest osseous metastatic disease. There are new complex fluid collections anteriorly in the left groin and proximal thigh, measuring up to 9.2 x 5.2 cm transverse on image 174/4. These fluid collections abut the left femoral vessels and could be secondary to previous surgery/vascular intervention or trauma. There is a small amount of associated hypermetabolic activity superiorly (SUV max 6.2). Incidental CT findings: Multilevel spondylosis. Chronic bilateral sacroiliitis. Prominent skeletal muscle activity. IMPRESSION: 1. No evidence of local recurrence or metastatic disease status post left nephrectomy. 2. New complex fluid collections anteriorly in the left groin and proximal thigh with associated hypermetabolic activity. These could be secondary to previous surgery, vascular intervention or trauma. Correlate clinically and consider further evaluation with ultrasound. 3. Pelvic floor laxity. 4.  Aortic Atherosclerosis (ICD10-I70.0). Electronically Signed   By: Elsie Perone M.D.   On: 05/10/2024 12:46   VAS US  ABI WITH/WO  TBI Result Date: 05/05/2024  LOWER EXTREMITY DOPPLER STUDY Patient Name:  Audrey [name]  Date of Exam:   05/05/2024 Medical Rec #: [id number]    Accession #:    [id number] Date of Birth: 09/08/1940   Patient Gender: F Patient Age:   89 years Exam Location:  [address] Procedure:      VAS US  ABI WITH/WO TBI Referring Phys: DONNICE SENDER --------------------------------------------------------------------------------  Indications: Peripheral artery disease. High Risk         Hypertension, hyperlipidemia, Diabetes, past history of Factors:          smoking.  Comparison Study: right: 0                   left: 0.58 Performing Technologist: Dena Pane  Examination Guidelines: A complete evaluation includes at minimum, Doppler waveform signals and systolic blood pressure reading at the level of bilateral brachial, anterior tibial, and posterior tibial arteries, when vessel segments are accessible. Bilateral testing is considered an integral part of a complete examination. Photoelectric Plethysmograph (PPG) waveforms and toe systolic pressure readings are included as required and additional duplex testing as needed. Limited examinations for reoccurring indications may be performed as noted.  ABI Findings: +---------+------------------+-----+----------+--------+ Right    Rt Pressure (mmHg)IndexWaveform  Comment  +---------+------------------+-----+----------+--------+ Brachial 139  triphasic          +---------+------------------+-----+----------+--------+ PTA      131               0.83 monophasic         +---------+------------------+-----+----------+--------+ DP       48                0.30 monophasic         +---------+------------------+-----+----------+--------+ Great Toe56                0.35                    +---------+------------------+-----+----------+--------+ +--------+------------------+-----+----------+-------+ Left    Lt Pressure (mmHg)IndexWaveform   Comment +--------+------------------+-----+----------+-------+ [id number]                    triphasic         +--------+------------------+-----+----------+-------+ PTA                            absent            +--------+------------------+-----+----------+-------+ DP      47                0.30 monophasic        +--------+------------------+-----+----------+-------+ +-------+-----------+----------------+------------+------------+ ABI/TBIToday's ABIToday's TBI     Previous ABIPrevious TBI +-------+-----------+----------------+------------+------------+ Right  0.83       0.35            0           0            +-------+-----------+----------------+------------+------------+ Left   0.30       unable to obtain0.58        0.32         +-------+-----------+----------------+------------+------------+  Right ABIs and TBIs appear increased. Left ABIs and TBIs appear decreased.  Summary: Right: Resting right ankle-brachial index indicates mild right lower extremity arterial disease. The right toe-brachial index is abnormal.  Left: Resting left ankle-brachial index indicates severe left lower extremity arterial disease. Unable to obtain left digit pressure. *See table(s) above for measurements and observations.  Electronically signed by Penne Colorado MD on 05/05/2024 at 5:12:41 PM.    Final    VAS US  LOWER EXTREMITY ARTERIAL DUPLEX Result Date: 05/05/2024 LOWER EXTREMITY ARTERIAL DUPLEX STUDY Patient Name:  Audrey [name]  Date of Exam:   05/05/2024 Medical Rec #: [id number]    Accession #:    [id number] Date of Birth: [birth year]   Patient Gender: F Patient Age:   64 years Exam Location:  [address] Procedure:      VAS US  LOWER EXTREMITY ARTERIAL DUPLEX Referring Phys: DONNICE SENDER --------------------------------------------------------------------------------  Indications: Peripheral artery disease. High Risk Factors: Hypertension, hyperlipidemia, Diabetes, past history of                     smoking, coronary artery disease.  Vascular Interventions: 12/29/23 laser atherectomy right SFA, popliteal and                         TP                         trunk. Drug eluting stent right TP trunk with 4x38  mm                         Synergy. Drug coated PTA right SFA and popliteal                         artery                         stents with 5 mm Ranger.                         10/21/2022: Stent of right popliteal and SFA with 6                         x 150                         mm Eluvia proximal and distal 6 x 60 mm Eluvia,                         and                         plain PTA of the right ATA. Current ABI:            right: 0.83                         left: 0.30 Comparison Study: 04/01/24                   right: 0                   left: 0.58 Performing Technologist: Dena Pane  Examination Guidelines: A complete evaluation includes B-mode imaging, spectral Doppler, color Doppler, and power Doppler as needed of all accessible portions of each vessel. Bilateral testing is considered an integral part of a complete examination. Limited examinations for reoccurring indications may be performed as noted.  +-----------+--------+-----+--------+-----------+-------------------+ RIGHT      PSV cm/sRatioStenosisWaveform   Comments            +-----------+--------+-----+--------+-----------+-------------------+ CFA Mid    106                  triphasic                      +-----------+--------+-----+--------+-----------+-------------------+ DFA        82                   biphasic                       +-----------+--------+-----+--------+-----------+-------------------+ SFA Prox   114                  monophasic                     +-----------+--------+-----+--------+-----------+-------------------+ SFA Mid    71                   multiphasic                     +-----------+--------+-----+--------+-----------+-------------------+ SFA Distal 51  monophasic                     +-----------+--------+-----+--------+-----------+-------------------+ POP Prox   32                   biphasic                       +-----------+--------+-----+--------+-----------+-------------------+ POP Distal 105                  monophasic                     +-----------+--------+-----+--------+-----------+-------------------+ TP Trunk                                   NWV                 +-----------+--------+-----+--------+-----------+-------------------+ ATA Prox                                   Unable to visualize +-----------+--------+-----+--------+-----------+-------------------+ ATA Mid                                    NWV                 +-----------+--------+-----+--------+-----------+-------------------+ ATA Distal                                 NWV                 +-----------+--------+-----+--------+-----------+-------------------+ PTA Prox                                   Unable to visualize +-----------+--------+-----+--------+-----------+-------------------+ PTA Mid                                    NWV                 +-----------+--------+-----+--------+-----------+-------------------+ PTA Distal                                 NWV                 +-----------+--------+-----+--------+-----------+-------------------+ PERO Prox                                  Unable to visualize +-----------+--------+-----+--------+-----------+-------------------+ PERO Mid   51                   monophasic                     +-----------+--------+-----+--------+-----------+-------------------+ PERO Distal46                   monophasic                     +-----------+--------+-----+--------+-----------+-------------------+  Summary: Right: The tibioperoneal trunk, anterior tibial  artery, and posterior tibial artery are  not well visualized. Patient has staples in the proximal calf and in the prox lateral shin. Unable to visualize stent walls clearly.  See table(s) above for measurements and observations. Electronically signed by Penne Colorado MD on 05/05/2024 at 5:12:19 PM.    Final    US  RT LOWER EXTREM LTD SOFT TISSUE NON VASCULAR Result Date: 04/10/2024 EXAM: US  RIGHT Lower Extremity Nonvascular Soft Tissue Ultrasound TECHNIQUE: Real-time ultrasound scan of the right knee with image documentation. COMPARISON: None available. CLINICAL HISTORY: Baker's cyst of knee, right. FINDINGS: SOFT TISSUES: Targeted ultrasound was performed over the posterior aspect of the right knee. No ultrasound abnormality identified. Specifically, no signs of focal fluid collection including abscess or popliteal fossa cyst. No solid mass. IMPRESSION: 1. No focal abnormality identified in the posterior aspect of the right knee, including no popliteal fossa cyst. Electronically signed by: Waddell Calk MD 04/10/2024 01:03 PM EST RP Workstation: [id number]   ECHOCARDIOGRAM COMPLETE Result Date: 04/04/2024    ECHOCARDIOGRAM REPORT   Patient Name:   Audrey [name] Date of Exam: 04/04/2024 Medical Rec #:  [id number]   Height:       58.0 in Accession #:    [id number]  Weight:       120.0 lb Date of Birth:  16-Nov-1940  BSA:          1.466 m Patient Age:    83 years    BP:           144/65 mmHg Patient Gender: F           HR:           68 bpm. Exam Location:  Inpatient Procedure: 2D Echo (Both Spectral and Color Flow Doppler were utilized during            procedure). Indications:    abnormal ecg  History:        Patient has no prior history of Echocardiogram examinations.                 CAD, PAD and metastatic cancer. chronic kidney disease; Risk                 Factors:Hypertension, Dyslipidemia and Diabetes.  Sonographer:    Tinnie Barefoot RDCS Referring Phys: [phone] PAULA B SIMPSON IMPRESSIONS  1. Left  ventricular ejection fraction, by estimation, is 50 to 55%. The left ventricle has low normal function. The left ventricle has no regional wall motion abnormalities. There is mild left ventricular hypertrophy. Left ventricular diastolic parameters are indeterminate.  2. Right ventricular systolic function is normal. The right ventricular size is normal. Tricuspid regurgitation signal is inadequate for assessing PA pressure.  3. The mitral valve is normal in structure. Trivial mitral valve regurgitation. No evidence of mitral stenosis.  4. The aortic valve is tricuspid. Aortic valve regurgitation is not visualized. Aortic valve sclerosis is present, with no evidence of aortic valve stenosis.  5. The inferior vena cava is normal in size with greater than 50% respiratory variability, suggesting right atrial pressure of 3 mmHg. FINDINGS  Left Ventricle: Left ventricular ejection fraction, by estimation, is 50 to 55%. The left ventricle has low normal function. The left ventricle has no regional wall motion abnormalities. The left ventricular internal cavity size was normal in size. There is mild left ventricular hypertrophy. Left ventricular diastolic parameters are indeterminate. Right Ventricle: The right ventricular size is normal. No increase in right ventricular wall thickness. Right ventricular systolic function is normal. Tricuspid regurgitation signal is inadequate  for assessing PA pressure. Left Atrium: Left atrial size was normal in size. Right Atrium: Right atrial size was normal in size. Pericardium: There is no evidence of pericardial effusion. Mitral Valve: The mitral valve is normal in structure. Trivial mitral valve regurgitation. No evidence of mitral valve stenosis. Tricuspid Valve: The tricuspid valve is normal in structure. Tricuspid valve regurgitation is trivial. Aortic Valve: The aortic valve is tricuspid. Aortic valve regurgitation is not visualized. Aortic valve sclerosis is present, with no  evidence of aortic valve stenosis. Pulmonic Valve: The pulmonic valve was not well visualized. Pulmonic valve regurgitation is trivial. Aorta: The aortic root and ascending aorta are structurally normal, with no evidence of dilitation. Venous: The inferior vena cava is normal in size with greater than 50% respiratory variability, suggesting right atrial pressure of 3 mmHg. IAS/Shunts: The interatrial septum was not well visualized.  LEFT VENTRICLE PLAX 2D LVIDd:         4.30 cm     Diastology LVIDs:         3.20 cm     LV e' medial:    5.11 cm/s LV PW:         0.90 cm     LV E/e' medial:  13.6 LV IVS:        1.00 cm     LV e' lateral:   7.72 cm/s LVOT diam:     2.00 cm     LV E/e' lateral: 9.0 LV SV:         58 LV SV Index:   40 LVOT Area:     3.14 cm LV IVRT:       120 msec  LV Volumes (MOD) LV vol d, MOD A2C: 64.3 ml LV vol d, MOD A4C: 56.9 ml LV vol s, MOD A2C: 27.8 ml LV vol s, MOD A4C: 29.3 ml LV SV MOD A2C:     36.5 ml LV SV MOD A4C:     56.9 ml LV SV MOD BP:      31.8 ml RIGHT VENTRICLE             IVC RV Basal diam:  2.60 cm     IVC diam: 1.50 cm RV S prime:     16.30 cm/s TAPSE (M-mode): 1.7 cm      PULMONARY VEINS                             Diastolic Velocity: 44.60 cm/s                             S/D Velocity:       1.30                             Systolic Velocity:  56.10 cm/s LEFT ATRIUM             Index        RIGHT ATRIUM           Index LA diam:        3.20 cm 2.18 cm/m   RA Area:     13.40 cm LA Vol (A2C):   33.0 ml 22.51 ml/m  RA Volume:   30.00 ml  20.47 ml/m LA Vol (A4C):   45.1 ml 30.77 ml/m LA Biplane Vol: 39.4 ml 26.88 ml/m  AORTIC VALVE LVOT Vmax:  84.60 cm/s LVOT Vmean:  53.400 cm/s LVOT VTI:    0.185 m  AORTA Ao Root diam: 3.30 cm Ao Asc diam:  3.20 cm MITRAL VALVE MV Area (PHT): 3.17 cm    SHUNTS MV Decel Time: 239 msec    Systemic VTI:  0.18 m MR Peak grad: 90.6 mmHg    Systemic Diam: 2.00 cm MR Mean grad: 61.0 mmHg MR Vmax:      476.00 cm/s MR Vmean:     373.0 cm/s MV E  velocity: 69.70 cm/s MV A velocity: 71.00 cm/s MV E/A ratio:  0.98 Lonni Nanas MD Electronically signed by Lonni Nanas MD Signature Date/Time: 04/04/2024/4:09:35 PM    Final    HYBRID OR IMAGING ([facility] ONLY) Result Date: 04/03/2024 There is no interpretation for this exam.  This order is for images obtained during a surgical procedure.  Please See Surgeries Tab for more information regarding the procedure.   PERIPHERAL VASCULAR CATHETERIZATION Result Date: 04/02/2024 Table formatting from the original result was not included. Images from the original result were not included. DATE OF SERVICE: 04/02/2024  PATIENT:  Audrey [name]  84 y.o. 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 Magda, MD  ASSISTANT: none  ANESTHESIA:   local and IV sedation  ESTIMATED BLOOD LOSS: min  LOCAL MEDICATIONS USED:  LIDOCAINE   COUNTS: confirmed correct.  PATIENT DISPOSITION:  PACU - hemodynamically stable.  Delay start of Pharmacological VTE agent (>24hrs) due to surgical blood loss or risk of bleeding: no  INDICATION FOR PROCEDURE: AMELIAH BASKINS is a 84 y.o. female with right leg pain in setting of thrombosed sfa / pop / TP trunk stenting. After careful discussion of risks, benefits, and alternatives the patient was offered angiogram. The patient understood and wished to proceed.  OPERATIVE FINDINGS:  Aortogram: Renal arteries Left: patent      Right: patent Infrarenal aorta: patent Common iliac arteries: Left: patent      Right: patent  Internal iliac arteries: Left: patent      Right: patent External iliac arteries: Left: patent      Right: patent  Right Lower Extremity Angiogram:             Common femoral artery: patent             Profunda femoris artery: patent             Superficial femoral artery: patent proximally; occludes as stenting begins around Hunter's canal             Popliteal artery: occluded stents above, behind, and below the knee             Anterior tibial artery: occluded             Tibioperoneal trunk: occluded stent             Peroneal artery: reconstitutes, but courses only to the high ankle             Posterior tibial artery: occluded             Pedal circulation: no contrast visualized  GLASS score. N/A  WIfI score. N/A  DESCRIPTION OF PROCEDURE: After identification of the patient in the pre-operative holding area, the patient was transferred to the operating room. The patient was positioned supine on the operating room table. Anesthesia was induced. The groins was prepped and draped in standard fashion. A surgical pause was performed confirming correct patient, procedure, and operative location.  The left groin was anesthetized with subcutaneous injection of 1% lidocaine . Using ultrasound guidance, the left common femoral artery was accessed with micropuncture technique. Fluoroscopy was used to confirm cannulation over the femoral head. The 244F micropuncture sheath was upsized to 68F.  A Benson wire was advanced into the distal aorta. Over the wire an omni flush catheter was advanced to the level of L2. Aortogram was performed - see above for details.  The right common iliac artery was selected with an omniflush catheter and glidewire guidewire. The wire was advanced into the common femoral artery. Over the wire the omni flush catheter was advanced into the external iliac artery. Selective angiography was performed - see above for details.  The decision was made to intervene. The patient was heparinized with 6000  units of heparin . The 68F sheath was exchanged for a 44F x 55cm sheath. Selective angiography of the right lower extremity performed prior to intervention.  I was able to cross the occluded stents fairly easily with a V18 wire.  The V18 wire would not advance much beyond the TP trunk stent, and so at both the sheath platform into the SFA.  With additional support I was able to cross into the native peroneal artery with a 6 g shepherd wire.  I then performed balloon angioplasty of the proximal peroneal artery, TP trunk, and popliteal artery with a 3 x 150 mm Sterling balloon.  Third order angiogram was then performed to confirm flow below this intervention.  The peroneal artery appeared occluded in the high ankle.  The anterior tibial artery did seem to reconstitute.  Given the ease of wire passage and the fairly acute appearing occlusion, elected to initiate catheter directed thrombolysis.  An EKOS catheter was advanced over the wire into the native peroneal artery extending back into the superficial femoral artery.  Catheter directed thrombolysis was initiated.  Conscious sedation was administered with the use of IV fentanyl  and midazolam  under continuous physician and nurse monitoring.  Heart rate, blood pressure, and oxygen saturation were continuously monitored.  Total sedation time was 43 minutes  Upon completion of the case instrument and sharps counts were confirmed correct. The patient was transferred to the PACU in good condition. I was present for all portions of the procedure.  PLAN: Thrombolysis overnight.  Return to OR tomorrow for catheter recheck.  Debby SAILOR. Magda, MD [facility] Phone Number: [phone] 04/02/2024 12:14 PM    VAS US  ABI WITH/WO TBI Result Date: 04/01/2024  LOWER EXTREMITY DOPPLER STUDY Patient Name:  Audrey [name]  Date of Exam:   04/01/2024 Medical Rec #: [id number]    Accession #:    [id number] Date of Birth: July 26, 1940   Patient  Gender: F Patient Age:   26 years Exam Location:  [address] Procedure:      VAS US  ABI WITH/WO TBI Referring Phys: PENNE COLORADO --------------------------------------------------------------------------------  Indications: Rest pain. High Risk Factors: Hypertension, hyperlipidemia, Diabetes, past history of                    smoking, coronary artery disease.  Vascular Interventions: 12/29/23 laser atherectomy right SFA, popliteal and TP                         trunk. Drug eluting stent right TP trunk with 4x38 mm                         Synergy. Drug coated PTA right SFA and popliteal artery                         stents with 5 mm Ranger.                         10/21/2022: Stent of right popliteal and SFA with 6 x 150                         mm Eluvia proximal and distal 6 x 60 mm Eluvia, and                         plain PTA of the right ATA. Performing Technologist: [employer] BS, RVT, RDCS  Examination Guidelines: A complete evaluation includes at minimum, Doppler waveform signals and systolic blood pressure reading at the level of bilateral brachial, anterior tibial, and posterior tibial arteries, when vessel segments are accessible. Bilateral testing is considered an integral part of a complete examination. Photoelectric Plethysmograph (PPG) waveforms and toe systolic pressure readings are included as required and additional duplex testing as needed. Limited examinations for reoccurring indications may be performed as noted.  ABI Findings: +---------+------------------+-----+--------+--------+ Right    Rt Pressure (mmHg)IndexWaveformComment  +---------+------------------+-----+--------+--------+ Brachial 142                                     +---------+------------------+-----+--------+--------+ ATA                             absent           +---------+------------------+-----+--------+--------+ PTA                             absent            +---------+------------------+-----+--------+--------+ Great Toe                       Absent           +---------+------------------+-----+--------+--------+ +---------+------------------+-----+----------+-------+ Left     Lt Pressure (mmHg)IndexWaveform  Comment +---------+------------------+-----+----------+-------+ Brachial 138                                      +---------+------------------+-----+----------+-------+ ATA      84                0.59 monophasic        +---------+------------------+-----+----------+-------+ PTA                             absent            +---------+------------------+-----+----------+-------+ [place]  0.21 Abnormal          +---------+------------------+-----+----------+-------+ +-------+-----------+-----------+------------+------------+ ABI/TBIToday's ABIToday's TBIPrevious ABIPrevious TBI +-------+-----------+-----------+------------+------------+ Right  0          0          .96         .40          +-------+-----------+-----------+------------+------------+ Left   .58        .32        .59         .21          +-------+-----------+-----------+------------+------------+  Right ABIs and TBIs appear decreased compared to prior study on 02/11/24. Left ABIs appear essentially unchanged compared to prior study on 02/11/24.  Summary: Right: Resting right ankle-brachial index indicates critical limb ischemia.  Left: Resting left ankle-brachial index indicates moderate left lower extremity arterial disease. The left toe-brachial index is abnormal.  *See table(s) above for measurements and observations.  Electronically signed by Fonda Rim on 04/01/2024 at 9:35:27 AM.    Final    VAS US  LOWER EXTREMITY ARTERIAL DUPLEX Result Date: 04/01/2024 LOWER EXTREMITY ARTERIAL DUPLEX STUDY Patient Name:  Audrey [name] [facility]  Date of Exam:   04/01/2024 Medical Rec #: [id number]    Accession #:    [id number] Date of Birth:  02-16-1941   Patient Gender: F Patient Age:   63 years Exam Location:  [address] Procedure:      VAS US  LOWER EXTREMITY ARTERIAL DUPLEX Referring Phys: PENNE COLORADO --------------------------------------------------------------------------------  Indications: Rest pain, and peripheral artery disease. High Risk Factors: Hypertension, hyperlipidemia, Diabetes, past history of                    smoking, coronary artery disease.  Vascular Interventions: 12/29/23 laser atherectomy right SFA, popliteal and TP                         trunk. Drug eluting stent right TP trunk with 4x38 mm                         Synergy. Drug coated PTA right SFA and popliteal artery                         stents with 5 mm Ranger.                         10/21/2022: Stent of right popliteal and SFA with 6 x 150                         mm Eluvia proximal and distal 6 x 60 mm Eluvia, and                         plain PTA of the right ATA. Current ABI:            Right absent, left .59 Comparison Study: 02/11/24 showed patent right SFA, popliteal and TP trunk                   stents. Performing Technologist: [employer] BS, RVT, RDCS  Examination Guidelines: A complete evaluation includes B-mode imaging, spectral Doppler, color Doppler, and power Doppler as needed of all accessible portions of each vessel. Bilateral testing is considered an integral part of a complete examination. Limited examinations for reoccurring indications  may be performed as noted.  +-----------+--------+-----+--------+---------+----------+ RIGHT      PSV cm/sRatioStenosisWaveform Comments   +-----------+--------+-----+--------+---------+----------+ CFA Prox   94                   triphasic           +-----------+--------+-----+--------+---------+----------+ DFA        80                   triphasic           +-----------+--------+-----+--------+---------+----------+ SFA Prox   48                   triphasic            +-----------+--------+-----+--------+---------+----------+ SFA Mid    38                   biphasic            +-----------+--------+-----+--------+---------+----------+ SFA Distal              occludedabsent   stent area +-----------+--------+-----+--------+---------+----------+ POP Prox                occludedabsent              +-----------+--------+-----+--------+---------+----------+ POP Distal              occludedabsent              +-----------+--------+-----+--------+---------+----------+ TP Trunk                occludedabsent              +-----------+--------+-----+--------+---------+----------+ PTA Distal              occluded                    +-----------+--------+-----+--------+---------+----------+ PERO Distal             occluded                    +-----------+--------+-----+--------+---------+----------+  +--------------+-------+-----------+--------+--------+-----+--------+ Left PoplitealAP (cm)Transv (cm)WaveformStenosisShapeComments +--------------+-------+-----------+--------+--------+-----+--------+ Distal                                                        +--------------+-------+-----------+--------+--------+-----+--------+  Summary: Right: Total occlusion noted in the superficial femoral artery and/or popliteal artery. Total occlusion noted in the popliteal artery. Interval occlusion of right distal SFA, popliteal and TP trunk stents, with no flow detected in the calf.  See table(s) above for measurements and observations. Electronically signed by Fonda Rim on 04/01/2024 at 9:35:18 AM.    Final     ASSESSMENT & PLAN:   Audrey [name] is a 84 y.o. female who returns for a follow up for metastatic renal cell carcinoma.   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 -Rt adrenal gland bx - showed clear cell RCC -S/p  Cytoreductive left radical nephrectomy and left adrenal gland resection on 08/01/2016 by Dr Renda. -Started systemic therapy with Nivolumab  240 mg on 01/02/2018 and Xgeva  on 02/27/2018.  Withheld nivolumab  after last treatment on 01/01/2023 currently on active surveillance   # Hypothyroidism/Adrenal insufficiency/Diabetes -Continue being followed by Dr. Faythe   #H/o Choledocholithiasis and cholelithiasis -07/10/18 US  Abdomen revealed Biliary  duct dilatation with 8 mm calculus at the level of the ampulla in the distal common bile duct. 2. Cholelithiasis. No gallbladder wall thickening or pericholecystic fluid. 3. Appearance of the liver raises concern for underlying hepatic cirrhosis. No focal liver lesions are demonstrable. 4.  Left kidney absent. 5.  Small cysts in right kidney. 6.  Aortic Atherosclerosis. -S/p ERCP on 07/23/18 with Dr. Aloha Mansouraty -09/02/18 Cytopathology from ERCP was suspicious for malignant cells in bile duct, but was not definitive, other two findings were benign.   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   Diabetes type 2 Atrophic urethrovaginitis Hypertension Diabetic neuropathy  PLAN: Patient reports her considerations with vascular surgery for her PAD and improvement in foot pain and ulcer. Labs from today were reviewed with her in details CBC/diff showed hgb 11.1, with normal WBC counts and platelets. CMP - stable -patients PET/CT from 05/10/2024 was detailed in details with her. No evidence of local recurrence of metastatic disease from her RCC. Hx of left Nephrectomy. Postop fluid collection in the left groin from her recent vascular surgery -no lab , clinical or radiographic evidence of RCC recurrence/progression No clear indication to restart RCC treatment at this time -will continue to monitor with active surveillance. -RTC 6 months -continue f/u with PCP , cards and vascular surgery  FOLLOW-UP in 6 months  for labs and follow-up with Dr. Onesimo.  The total time spent in the appointment was 30 minutes* .  All of the patient's questions were answered and the patient knows to call the clinic with any problems, questions, or concerns.  Emaline Onesimo MD MS AAHIVMS [facility] Hematology/Oncology Physician [facility]  *Total Encounter Time as defined by the Centers for Medicare and Medicaid Services includes, in addition to the face-to-face time of a patient visit (documented in the note above) non-face-to-face time: obtaining and reviewing outside history, ordering and reviewing medications, tests or procedures, care coordination (communications with other health care professionals or caregivers) and documentation in the medical record.  I, Alan Blowers, acting as a neurosurgeon for Emaline Onesimo, MD.,have documented all relevant documentation on the behalf of Emaline Onesimo, MD,as directed by  Emaline Onesimo, MD while in the presence of Emaline Onesimo, MD.  I have reviewed the above documentation for accuracy and completeness, and I agree with the above.  Emaline Onesimo, MD     [1]  Social History Tobacco Use   Smoking status: Former    Current packs/day: 0.00    Average packs/day: 1 pack/day for 8.0 years (8.0 ttl pk-yrs)    Types: Cigarettes    Start date: 06/18/1956    Quit date: 06/18/1964    Years since quitting: 59.9   Smokeless tobacco: Never  Vaping Use   Vaping status: Never Used  Substance Use Topics   Alcohol  use: No   Drug use: No   "

## 2024-05-19 ENCOUNTER — Ambulatory Visit: Admitting: Podiatry

## 2024-05-19 ENCOUNTER — Encounter: Payer: Self-pay | Admitting: Hematology

## 2024-05-21 ENCOUNTER — Other Ambulatory Visit: Payer: Self-pay

## 2024-05-25 ENCOUNTER — Other Ambulatory Visit: Payer: Self-pay

## 2024-05-25 ENCOUNTER — Ambulatory Visit: Admitting: Podiatry

## 2024-05-26 ENCOUNTER — Encounter: Payer: Self-pay | Admitting: Physician Assistant

## 2024-05-26 ENCOUNTER — Ambulatory Visit: Admitting: Physician Assistant

## 2024-05-26 VITALS — BP 149/69 | HR 75 | Temp 98.1°F | Resp 20 | Ht <= 58 in | Wt 125.1 lb

## 2024-05-26 DIAGNOSIS — I70222 Atherosclerosis of native arteries of extremities with rest pain, left leg: Secondary | ICD-10-CM

## 2024-05-26 DIAGNOSIS — I739 Peripheral vascular disease, unspecified: Secondary | ICD-10-CM

## 2024-05-26 DIAGNOSIS — I70221 Atherosclerosis of native arteries of extremities with rest pain, right leg: Secondary | ICD-10-CM

## 2024-05-26 NOTE — Progress Notes (Signed)
 " Office Note     CC:  follow up Requesting Provider:  Irven Ozell DEL, MD  HPI: Audrey [name] is a 85 y.o. (1940/07/24) female who presents for follow up wound check. She recently underwent Angiogram with Right peroneal, TP trunk, popliteal artery angioplasty (3x181mm Sterling) and catheter directed thrombolysis (EKOS) on 04/02/24 by Dr. Magda. This was followed by lysis catheter recheck by Dr. Serene on 04/03/24 with Angioplasty of the right peroneal artery and tibioperoneal trunk as well as stenting of the right superficial femoral artery. This was performed secondary to rest pain with occlusion of previously placed SFA stent. Unfortunately  post procedure she developed compartment syndrome of her right leg with acute occlusion of her left common femoral and had to undergo 1#: 4 compartment fasciotomy, right leg #2: Left iliofemoral endarterectomy with bovine pericardial patch angioplasty #3: Left leg angiogram and #4: Placement of first 38 cm Kerecis skin substitute by Dr. Serene. She had prolonged hospital course following the above and was discharged to [facility] and ultimately discharged home on 04/16/24.   At her last visit her legs were feeling better but she was having some incisional pain in left groin. The staples were removed. She did have a seroma present in the proximal left thigh. This did not appear infected. Her RLE fasciotomy sites were progressing well.   Today she reports overall doing okay. Her groin incision has healed. Her pain is resolved. She is continuing to keep dry gauze in the groin as she feels more comfortable and  clean. She continues to have swelling in her right leg, more so than left leg. She continues to have  fluid pouches around left thigh. She also reports them around right hip. She reports that she recently had PET scan that showed fluid collections. These are not painful. She says she has been elevating but not as much as I should. She otherwise is ambulating  well with a cane. 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 She is now having some new issues with her left arm/ shoulder going numb. She says she has a pinched nerve. She is medically managed on Aspirin , Statin and Plavix .   Past Medical History:  Diagnosis Date   Anemia    Arthritis    lower back, hips, hands   Biliary stricture (HCC)    Diabetes mellitus (HCC)    type 2    Early cataracts, bilateral    Md just watching   Elevated liver enzymes    Family history of adverse reaction to anesthesia    Daughter hard to wake up   Gallstones    GERD (gastroesophageal reflux disease)    occasional - diet controlled   History of blood transfusion 2018   History of hiatal hernia    HTN (hypertension)    Hyperlipidemia    Hypothyroidism    left renal ca dx'd 2018   renal cancer - left kidney removed, pill chemo x 1 yr   Myocardial infarction (HCC) 1991   no deficits   Peripheral vascular disease    PONV (postoperative nausea and vomiting)    SVD (spontaneous vaginal delivery)    x 3   Wears glasses     Past Surgical History:  Procedure Laterality Date   ABDOMINAL AORTOGRAM N/A 12/29/2023   Procedure: ABDOMINAL AORTOGRAM;  Surgeon: Sheree Penne Bruckner, MD;  Location: [facility] INVASIVE CV LAB;  Service: Cardiovascular;  Laterality: N/A;   ABDOMINAL AORTOGRAM W/LOWER EXTREMITY  N/A 10/21/2022   Procedure: ABDOMINAL AORTOGRAM W/LOWER EXTREMITY;  Surgeon: Sheree Penne Bruckner, MD;  Location: [facility] INVASIVE CV LAB;  Service: Cardiovascular;  Laterality: N/A;   ABDOMINAL AORTOGRAM W/LOWER EXTREMITY N/A 04/02/2024   Procedure: ABDOMINAL AORTOGRAM W/LOWER EXTREMITY;  Surgeon: Magda Debby SAILOR, MD;  Location: [facility] INVASIVE CV LAB;  Service: Cardiovascular;  Laterality: N/A;   BALLOON DILATION N/A 07/23/2018   Procedure: BALLOON DILATION;  Surgeon: Wilhelmenia Aloha Raddle., MD;  Location: [facility] ENDOSCOPY;  Service: Gastroenterology;  Laterality: N/A;   BILIARY  BRUSHING  08/10/2018   Procedure: BILIARY BRUSHING;  Surgeon: Wilhelmenia Aloha Raddle., MD;  Location: [facility] ENDOSCOPY;  Service: Gastroenterology;;   BILIARY BRUSHING  11/30/2018   Procedure: BILIARY BRUSHING;  Surgeon: Wilhelmenia Aloha Raddle., MD;  Location: [facility] ENDOSCOPY;  Service: Gastroenterology;;   BILIARY DILATION  08/10/2018   Procedure: BILIARY DILATION;  Surgeon: Wilhelmenia Aloha Raddle., MD;  Location: [facility] ENDOSCOPY;  Service: Gastroenterology;;   BILIARY DILATION  08/27/2018   Procedure: BILIARY DILATION;  Surgeon: Wilhelmenia Aloha Raddle., MD;  Location: [facility] ENDOSCOPY;  Service: Gastroenterology;;   BILIARY DILATION  01/24/2020   Procedure: BILIARY DILATION;  Surgeon: Wilhelmenia Aloha Raddle., MD;  Location: [facility] ENDOSCOPY;  Service: Gastroenterology;;   BILIARY STENT PLACEMENT  08/10/2018   Procedure: BILIARY STENT PLACEMENT;  Surgeon: Wilhelmenia Aloha Raddle., MD;  Location: [facility] ENDOSCOPY;  Service: Gastroenterology;;   BILIARY STENT PLACEMENT  08/27/2018   Procedure: BILIARY STENT PLACEMENT;  Surgeon: Wilhelmenia Aloha Raddle., MD;  Location: [facility] ENDOSCOPY;  Service: Gastroenterology;;   BILIARY STENT PLACEMENT  11/30/2018   Procedure: BILIARY STENT PLACEMENT;  Surgeon: Wilhelmenia Aloha Raddle., MD;  Location: [facility] ENDOSCOPY;  Service: Gastroenterology;;   BIOPSY  07/23/2018   Procedure: BIOPSY;  Surgeon: Wilhelmenia Aloha Raddle., MD;  Location: [facility] ENDOSCOPY;  Service: Gastroenterology;;   BIOPSY  01/24/2020   Procedure: BIOPSY;  Surgeon: Wilhelmenia Aloha Raddle., MD;  Location: [facility] ENDOSCOPY;  Service: Gastroenterology;;   CHOLECYSTECTOMY N/A 09/02/2018   Procedure: LAPAROSCOPIC CHOLECYSTECTOMY;  Surgeon: Aron Shoulders, MD;  Location: [facility] OR;  Service: General;  Laterality: N/A;   COLONOSCOPY     normal    ENDARTERECTOMY FEMORAL Left 04/03/2024   Procedure: ENDARTERECTOMY, FEMORAL;  Surgeon: Serene Gaile ORN, MD;  Location: [facility] OR;  Service: Vascular;  Laterality: Left;   ENDOSCOPIC RETROGRADE  CHOLANGIOPANCREATOGRAPHY (ERCP) WITH PROPOFOL  N/A 08/10/2018   Procedure: ENDOSCOPIC RETROGRADE CHOLANGIOPANCREATOGRAPHY (ERCP) WITH PROPOFOL ;  Surgeon: Wilhelmenia Aloha Raddle., MD;  Location: [facility] ENDOSCOPY;  Service: Gastroenterology;  Laterality: N/A;   ENDOSCOPIC RETROGRADE CHOLANGIOPANCREATOGRAPHY (ERCP) WITH PROPOFOL  N/A 11/30/2018   Procedure: ENDOSCOPIC RETROGRADE CHOLANGIOPANCREATOGRAPHY (ERCP) WITH PROPOFOL ;  Surgeon: Wilhelmenia Aloha Raddle., MD;  Location: [facility] ENDOSCOPY;  Service: Gastroenterology;  Laterality: N/A;   ENDOSCOPIC RETROGRADE CHOLANGIOPANCREATOGRAPHY (ERCP) WITH PROPOFOL  N/A 01/24/2020   Procedure: ENDOSCOPIC RETROGRADE CHOLANGIOPANCREATOGRAPHY (ERCP) WITH PROPOFOL ;  Surgeon: Wilhelmenia Aloha Raddle., MD;  Location: [facility] ENDOSCOPY;  Service: Gastroenterology;  Laterality: N/A;   ERCP N/A 07/23/2018   Procedure: ENDOSCOPIC RETROGRADE CHOLANGIOPANCREATOGRAPHY (ERCP);  Surgeon: Wilhelmenia Aloha Raddle., MD;  Location: [facility] ENDOSCOPY;  Service: Gastroenterology;  Laterality: N/A;   ERCP N/A 08/27/2018   Procedure: ENDOSCOPIC RETROGRADE CHOLANGIOPANCREATOGRAPHY (ERCP);  Surgeon: Wilhelmenia Aloha Raddle., MD;  Location: [facility] ENDOSCOPY;  Service: Gastroenterology;  Laterality: N/A;   ESOPHAGOGASTRODUODENOSCOPY N/A 01/24/2020   Procedure: ESOPHAGOGASTRODUODENOSCOPY (EGD);  Surgeon: Wilhelmenia Aloha Raddle., MD;  Location: [facility] ENDOSCOPY;  Service: Gastroenterology;  Laterality: N/A;   ESOPHAGOGASTRODUODENOSCOPY (EGD) WITH PROPOFOL  N/A 08/27/2018   Procedure: ESOPHAGOGASTRODUODENOSCOPY (EGD) WITH PROPOFOL ;  Surgeon: Wilhelmenia Aloha Raddle., MD;  Location: [facility] ENDOSCOPY;  Service: Gastroenterology;  Laterality: N/A;   EUS N/A 08/27/2018   Procedure: ESOPHAGEAL ENDOSCOPIC ULTRASOUND (EUS) RADIAL;  Surgeon: Wilhelmenia Aloha Raddle., MD;  Location: [facility] ENDOSCOPY;  Service: Gastroenterology;  Laterality: N/A;   FINE NEEDLE ASPIRATION  08/27/2018   Procedure: FINE NEEDLE ASPIRATION (FNA) LINEAR;  Surgeon: Wilhelmenia Aloha Raddle., MD;  Location: [facility] ENDOSCOPY;  Service: Gastroenterology;;   QUILLIAN EXPOSURE Left 04/03/2024   Procedure: GROIN EXPOSURE;  Surgeon: Serene Gaile ORN, MD;  Location: [facility] OR;  Service: Vascular;  Laterality: Left;   INCISIONAL HERNIA REPAIR N/A 06/23/2020   Procedure: OPEN INCISIONAL HERNIA REPAIR WITH MESH;  Surgeon: Lyndel Deward PARAS, MD;  Location: [facility] ORS;  Service: General;  Laterality: N/A;  ROOM 2 STARTING AT 11:00AM FOR 90 MIN   IR IMAGING GUIDED PORT INSERTION  01/08/2018   LAPAROSCOPIC NEPHRECTOMY Left 08/01/2016   Procedure: LAPAROSCOPIC  RADICAL NEPHRECTOMY/ REPAIR OF UMBILICAL HERNIA;  Surgeon: Gretel Ferrara, MD;  Location: [facility] ORS;  Service: Urology;  Laterality: Left;   LOWER EXTREMITY ANGIOGRAM  04/03/2024   Procedure: GERALYN, LOWER EXTREMITY;  Surgeon: Serene Gaile ORN, MD;  Location: [facility] OR;  Service: Vascular;;   LOWER EXTREMITY ANGIOGRAM Left 04/03/2024   Procedure: GERALYN, LOWER EXTREMITY WITH PERONEAL AND TRUNK BALLOOON ANGIOPLASTY;  Surgeon: Serene Gaile ORN, MD;  Location: [facility] OR;  Service: Vascular;  Laterality: Left;   LOWER EXTREMITY ANGIOGRAPHY N/A 12/29/2023   Procedure: Lower Extremity Angiography;  Surgeon: Sheree Penne Bruckner, MD;  Location: [facility] INVASIVE CV LAB;  Service: Cardiovascular;  Laterality: N/A;   LOWER EXTREMITY INTERVENTION N/A 12/29/2023   Procedure: LOWER EXTREMITY INTERVENTION;  Surgeon: Sheree Penne Bruckner, MD;  Location: [facility] INVASIVE CV LAB;  Service: Cardiovascular;  Laterality: N/A;   LOWER EXTREMITY INTERVENTION N/A 04/02/2024   Procedure: LOWER EXTREMITY INTERVENTION;  Surgeon: Magda Debby SAILOR, MD;  Location: [facility] INVASIVE CV LAB;  Service: Cardiovascular;  Laterality: N/A;   LYSIS RE-CHECK Left 04/03/2024   Procedure: LYSIS RE-CHECK;  Surgeon: Serene Gaile ORN, MD;  Location: [facility] OR;  Service: Vascular;  Laterality: Left;   PATCH ANGIOPLASTY Left 04/03/2024   Procedure: ANGIOPLASTY, USING PATCH GRAFT, LEFT COMMON FEMORAL ARTERY;   Surgeon: Serene Gaile ORN, MD;  Location: [facility] OR;  Service: Vascular;  Laterality: Left;   PERIPHERAL VASCULAR INTERVENTION  10/21/2022   Procedure: PERIPHERAL VASCULAR INTERVENTION;  Surgeon: Sheree Penne Bruckner, MD;  Location: [facility] INVASIVE CV LAB;  Service: Cardiovascular;;   REMOVAL OF STONES  07/23/2018   Procedure: REMOVAL OF GALL STONES;  Surgeon: Wilhelmenia Aloha Raddle., MD;  Location: [facility] ENDOSCOPY;  Service: Gastroenterology;;   REMOVAL OF STONES  08/10/2018   Procedure: REMOVAL OF STONES;  Surgeon: Wilhelmenia Aloha Raddle., MD;  Location: [facility] ENDOSCOPY;  Service: Gastroenterology;;   REMOVAL OF STONES  08/27/2018   Procedure: REMOVAL OF STONES;  Surgeon: Wilhelmenia Aloha Raddle., MD;  Location: [facility] ENDOSCOPY;  Service: Gastroenterology;;   REMOVAL OF STONES  01/24/2020   Procedure: REMOVAL OF STONES;  Surgeon: Wilhelmenia Aloha Raddle., MD;  Location: [facility] ENDOSCOPY;  Service: Gastroenterology;;   ANNETT  07/23/2018   Procedure: ANNETT;  Surgeon: Wilhelmenia Aloha Raddle., MD;  Location: [facility] ENDOSCOPY;  Service: Gastroenterology;;   CLEDA REMOVAL  08/27/2018   Procedure: STENT REMOVAL;  Surgeon: Wilhelmenia Aloha Raddle., MD;  Location: [facility] ENDOSCOPY;  Service: Gastroenterology;;   CLEDA REMOVAL  11/30/2018   Procedure: STENT REMOVAL;  Surgeon: Wilhelmenia Aloha Raddle., MD;  Location: [facility] ENDOSCOPY;  Service: Gastroenterology;;   CLEDA REMOVAL  01/24/2020  Procedure: STENT REMOVAL;  Surgeon: Wilhelmenia Aloha Raddle., MD;  Location: [facility] ENDOSCOPY;  Service: Gastroenterology;;   THIGH FASCIOTOMY Right 04/03/2024   Procedure: FASCIOTOMY, 4 compartment right lower leg;  Surgeon: Serene Gaile ORN, MD;  Location: [facility] OR;  Service: Vascular;  Laterality: Right;   UPPER GI ENDOSCOPY     x 1   VAGINAL PROLAPSE REPAIR  11/18/2019   [facility]    Social History   Socioeconomic History   Marital status: Widowed    Spouse name: Not on file   Number of children: 3   Years of education: Not on file    Highest education level: Not on file  Occupational History   Occupation: retired  Tobacco Use   Smoking status: Former    Current packs/day: 0.00    Average packs/day: 1 pack/day for 8.0 years (8.0 ttl pk-yrs)    Types: Cigarettes    Start date: 06/18/1956    Quit date: 06/18/1964    Years since quitting: 59.9   Smokeless tobacco: Never  Vaping Use   Vaping status: Never Used  Substance and Sexual Activity   Alcohol  use: No   Drug use: No   Sexual activity: Not Currently    Birth control/protection: Post-menopausal  Other Topics Concern   Not on file  Social History Narrative   Married.  Three children   Social Drivers of Health   Tobacco Use: Medium Risk (05/05/2024)   Patient History    Smoking Tobacco Use: Former    Smokeless Tobacco Use: Never    Passive Exposure: Not on file  Financial Resource Strain: Low Risk (05/23/2022)   Overall Financial Resource Strain (CARDIA)    Difficulty of Paying Living Expenses: Not hard at all  Food Insecurity: No Food Insecurity (04/02/2024)   Epic    Worried About Programme Researcher, Broadcasting/film/video in the Last Year: Never true    Ran Out of Food in the Last Year: Never true  Transportation Needs: No Transportation Needs (04/02/2024)   Epic    Lack of Transportation (Medical): No    Lack of Transportation (Non-Medical): No  Physical Activity: Not on file  Stress: Not on file  Social Connections: Socially Isolated (04/02/2024)   Social Connection and Isolation Panel    Frequency of Communication with Friends and Family: Once a week    Frequency of Social Gatherings with Friends and Family: Once a week    Attends Religious Services: 1 to 4 times per year    Active Member of [employer] or Organizations: No    Attends Banker Meetings: Never    Marital Status: Widowed  Intimate Partner Violence: Not At Risk (04/02/2024)   Epic    Fear of Current or Ex-Partner: No    Emotionally Abused: No    Physically Abused: No    Sexually Abused: No   Depression (PHQ2-9): Not on file  Alcohol  Screen: Not on file  Housing: Low Risk (04/02/2024)   Epic    Unable to Pay for Housing in the Last Year: No    Number of Times Moved in the Last Year: 0    Homeless in the Last Year: No  Utilities: Not At Risk (04/02/2024)   Epic    Threatened with loss of utilities: No  Health Literacy: Not on file    Family History  Problem Relation Age of Onset   Heart attack Father 51   Heart disease Brother 77       CABG   Colon cancer  Neg Hx    Esophageal cancer Neg Hx    Inflammatory bowel disease Neg Hx    Liver disease Neg Hx    Pancreatic cancer Neg Hx    Rectal cancer Neg Hx    Stomach cancer Neg Hx     Current Outpatient Medications  Medication Sig Dispense Refill   [Paused] amLODipine  (NORVASC ) 5 MG tablet Take 5 mg by mouth daily.  6   atorvastatin  (LIPITOR) 80 MG tablet Take 80 mg by mouth daily.     B Complex-C (B-COMPLEX WITH VITAMIN C) tablet Take 1 tablet by mouth daily. 100 tablet 0   clobetasol  ointment (TEMOVATE ) 0.05 % Apply 1 application  topically 2 (two) times daily. lichen sclerosis     clopidogrel  (PLAVIX ) 75 MG tablet Take 75 mg by mouth daily.     glucose blood (ONETOUCH ULTRA TEST) test strip Use as instructed 100 each 12   hydrocortisone  (CORTEF ) 10 MG tablet Take 1 tablet (10 mg total) by mouth daily.     insulin  aspart (NOVOLOG ) 100 UNIT/ML injection Inject 0-9 Units into the skin every 4 (four) hours.     insulin  glargine (LANTUS ) 100 UNIT/ML Solostar Pen Inject 14 Units into the skin 2 (two) times daily. 15 mL 0   ipratropium (ATROVENT  HFA) 17 MCG/ACT inhaler Inhale 2 puffs into the lungs every 6 (six) hours as needed for wheezing.     Lancets (ONETOUCH ULTRASOFT) lancets USE 1 LANCET PER BLOOD SUGAR CHECK 60 each 0   levothyroxine  (SYNTHROID ) 75 MCG tablet Take 75 mcg by mouth daily before breakfast.      lidocaine  (LIDODERM ) 5 % Place 3 patches onto the skin daily. Remove & Discard patch within 12 hours or as  directed by MD 30 patch 0   melatonin 3 MG TABS tablet Take 1 tablet (3 mg total) by mouth at bedtime.     polyethylene glycol powder (GLYCOLAX /MIRALAX ) 17 GM/SCOOP powder Dissolve 1 capful (17g) in 4-8 ounces of liquid and take by mouth daily. 238 g 0   [Paused] repaglinide  (PRANDIN ) 0.5 MG tablet Take 0.5 mg by mouth 2 (two) times daily.     senna-docusate (SENOKOT-S) 8.6-50 MG tablet Take 1 tablet by mouth daily with supper. 30 tablet 0   terbinafine  (LAMISIL ) 1 % cream Apply topically daily.     vitamin D3 (CHOLECALCIFEROL ) 25 MCG tablet Take 3 tablets (3,000 Units total) by mouth daily. 90 tablet 0   No current facility-administered medications for this visit.    Allergies[1]   REVIEW OF SYSTEMS:  Negative unless noted in HPI [X]  denotes positive finding, [ ]  denotes negative finding Cardiac  Comments:  Chest pain or chest pressure:    Shortness of breath upon exertion:    Short of breath when lying flat:    Irregular heart rhythm:        Vascular    Pain in calf, thigh, or hip brought on by ambulation:    Pain in feet at night that wakes you up from your sleep:     Blood clot in your veins:    Leg swelling:         Pulmonary    Oxygen at home:    Productive cough:     Wheezing:         Neurologic    Sudden weakness in arms or legs:     Sudden numbness in arms or legs:     Sudden onset of difficulty speaking or slurred speech:  Temporary loss of vision in one eye:     Problems with dizziness:         Gastrointestinal    Blood in stool:     Vomited blood:         Genitourinary    Burning when urinating:     Blood in urine:        Psychiatric    Major depression:         Hematologic    Bleeding problems:    Problems with blood clotting too easily:        Skin    Rashes or ulcers:        Constitutional    Fever or chills:      PHYSICAL EXAMINATION:  Vitals:   05/26/24 1416  BP: (!) 149/69  Pulse: 75  Resp: 20  Temp: 98.1 F (36.7 C)   TempSrc: Temporal  SpO2: 98%  Weight: 125 lb 1.6 oz (56.7 kg)  Height: 4' 10 (1.473 m)    General:  WDWN in NAD; vital signs documented above Gait: Normal, ambulates with cane for assistance HENT: WNL, normocephalic Pulmonary: normal non-labored breathing Cardiac: regular HR Abdomen: soft Vascular Exam/Pulses: Doppler DP, PT, Pero signals bilaterally, feet warm and well perfused; left groin incision is well healed. Right leg incisions area healing well. Small scab present on medial distal fasciotomy incision.  Extremities: without ischemic changes, without Gangrene , without cellulitis; without open wounds; bilateral lower leg edema, right greater than left. She has some fluid collections appreciable in left upper medial and lateral thigh and overlying left hip, also on right hip. Soft, without any signs of infection Musculoskeletal: no muscle wasting or atrophy  Neurologic: A&O X 3 Psychiatric:  The pt has Normal affect.  ASSESSMENT/PLAN:: 84 y.o. female presents for follow up wound check. She recently underwent Angiogram with Right peroneal, TP trunk, popliteal artery angioplasty (3x130mm Sterling) and catheter directed thrombolysis (EKOS) on 04/02/24 by Dr. Magda. This was followed by lysis catheter recheck by Dr. Serene on 04/03/24 with Angioplasty of the right peroneal artery and tibioperoneal trunk as well as stenting of the right superficial femoral artery. This was performed secondary to rest pain with occlusion of previously placed SFA stent. Unfortunately  post procedure she developed compartment syndrome of her right leg with acute occlusion of her left common femoral and had to undergo 1#: 4 compartment fasciotomy, right leg #2: Left iliofemoral endarterectomy with bovine pericardial patch angioplasty #3: Left leg angiogram and #4: Placement of first 38 cm Kerecis skin substitute by Dr. Serene. Her incisions are all healed. Small scab present on right medial fasciotomy incision.  Her bilateral lower legs are well perfused and warm with doppler signals. She does not have claudication, rest pain or tissue loss.  - encourage daily elevation of her legs above heart level - She can consider light compression stockings also to help with edema - continue to clean incisions with mild soap and water . Okay to use gauze in groin if she perfers - Continue Aspirin , Statin, Plavix   - She will follow up in 3 months with BLE arterial duplex and ABI. She knows to call for earlier appointment if any new or concerning symptoms   Teretha Damme, PA-C [facility] [phone]  Clinic MD:   Sheree      [1]  Allergies Allergen Reactions   Ceftriaxone  Anaphylaxis   Vibramycin [Doxycycline] Nausea And Vomiting   Adhesive [Tape] Other (See Comments)    Very thin skin, tears  easily  OK to use paper tape   Cephalosporins Dermatitis    Tolerated cefazolin  and ceftriaxone     Cymbalta [Duloxetine Hcl] Other (See Comments)    Dizziness  Woozy   Nsaids Other (See Comments)    Told to avoid, patient has 1 kidney   Statins Other (See Comments)    Liver/kidney issues.   "

## 2024-05-27 ENCOUNTER — Other Ambulatory Visit: Payer: Self-pay | Admitting: *Deleted

## 2024-05-27 DIAGNOSIS — I70221 Atherosclerosis of native arteries of extremities with rest pain, right leg: Secondary | ICD-10-CM

## 2024-05-27 DIAGNOSIS — I70222 Atherosclerosis of native arteries of extremities with rest pain, left leg: Secondary | ICD-10-CM

## 2024-06-03 ENCOUNTER — Ambulatory Visit: Admitting: Podiatry

## 2024-06-18 ENCOUNTER — Ambulatory Visit (HOSPITAL_COMMUNITY)

## 2024-07-08 ENCOUNTER — Encounter: Admitting: Physical Medicine and Rehabilitation

## 2024-08-25 ENCOUNTER — Ambulatory Visit (HOSPITAL_COMMUNITY)

## 2024-08-25 ENCOUNTER — Ambulatory Visit

## 2024-11-19 ENCOUNTER — Inpatient Hospital Stay

## 2024-11-19 ENCOUNTER — Inpatient Hospital Stay: Attending: Hematology | Admitting: Hematology
